# Patient Record
Sex: Male | Born: 1945
Health system: Southern US, Community
[De-identification: ages and names within clinical notes are randomized; demographics above are authoritative.]

## PROBLEM LIST (undated history)

## (undated) DIAGNOSIS — M2142 Flat foot [pes planus] (acquired), left foot: Secondary | ICD-10-CM

## (undated) DIAGNOSIS — M2141 Flat foot [pes planus] (acquired), right foot: Secondary | ICD-10-CM

## (undated) DIAGNOSIS — H269 Unspecified cataract: Secondary | ICD-10-CM

## (undated) DIAGNOSIS — E119 Type 2 diabetes mellitus without complications: Secondary | ICD-10-CM

## (undated) DIAGNOSIS — R7402 Elevation of levels of lactic acid dehydrogenase (LDH): Secondary | ICD-10-CM

## (undated) DIAGNOSIS — K219 Gastro-esophageal reflux disease without esophagitis: Secondary | ICD-10-CM

## (undated) DIAGNOSIS — M1990 Unspecified osteoarthritis, unspecified site: Secondary | ICD-10-CM

## (undated) DIAGNOSIS — D649 Anemia, unspecified: Secondary | ICD-10-CM

## (undated) DIAGNOSIS — Z973 Presence of spectacles and contact lenses: Secondary | ICD-10-CM

## (undated) DIAGNOSIS — C61 Malignant neoplasm of prostate: Secondary | ICD-10-CM

## (undated) DIAGNOSIS — G629 Polyneuropathy, unspecified: Secondary | ICD-10-CM

## (undated) DIAGNOSIS — R053 Chronic cough: Secondary | ICD-10-CM

## (undated) DIAGNOSIS — G4730 Sleep apnea, unspecified: Secondary | ICD-10-CM

## (undated) DIAGNOSIS — E519 Thiamine deficiency, unspecified: Secondary | ICD-10-CM

## (undated) DIAGNOSIS — F40241 Acrophobia: Secondary | ICD-10-CM

## (undated) DIAGNOSIS — M722 Plantar fascial fibromatosis: Secondary | ICD-10-CM

## (undated) DIAGNOSIS — E669 Obesity, unspecified: Secondary | ICD-10-CM

## (undated) DIAGNOSIS — N401 Enlarged prostate with lower urinary tract symptoms: Secondary | ICD-10-CM

## (undated) DIAGNOSIS — K7689 Other specified diseases of liver: Secondary | ICD-10-CM

## (undated) DIAGNOSIS — L920 Granuloma annulare: Secondary | ICD-10-CM

## (undated) DIAGNOSIS — K766 Portal hypertension: Secondary | ICD-10-CM

## (undated) DIAGNOSIS — K5790 Diverticulosis of intestine, part unspecified, without perforation or abscess without bleeding: Secondary | ICD-10-CM

## (undated) DIAGNOSIS — K269 Duodenal ulcer, unspecified as acute or chronic, without hemorrhage or perforation: Secondary | ICD-10-CM

## (undated) DIAGNOSIS — C9110 Chronic lymphocytic leukemia of B-cell type not having achieved remission: Secondary | ICD-10-CM

## (undated) DIAGNOSIS — Z86010 Personal history of colonic polyps: Secondary | ICD-10-CM

## (undated) DIAGNOSIS — K7030 Alcoholic cirrhosis of liver without ascites: Secondary | ICD-10-CM

## (undated) DIAGNOSIS — I10 Essential (primary) hypertension: Secondary | ICD-10-CM

## (undated) DIAGNOSIS — R05 Cough: Secondary | ICD-10-CM

## (undated) DIAGNOSIS — N433 Hydrocele, unspecified: Secondary | ICD-10-CM

## (undated) DIAGNOSIS — J189 Pneumonia, unspecified organism: Secondary | ICD-10-CM

## (undated) DIAGNOSIS — R740 Nonspecific elevation of levels of transaminase and lactic acid dehydrogenase [LDH]: Secondary | ICD-10-CM

## (undated) DIAGNOSIS — K769 Liver disease, unspecified: Secondary | ICD-10-CM

## (undated) DIAGNOSIS — M25511 Pain in right shoulder: Secondary | ICD-10-CM

## (undated) DIAGNOSIS — R351 Nocturia: Secondary | ICD-10-CM

## (undated) DIAGNOSIS — R7401 Elevation of levels of liver transaminase levels: Secondary | ICD-10-CM

## (undated) HISTORY — DX: Diverticulosis of intestine, part unspecified, without perforation or abscess without bleeding: K57.90

## (undated) HISTORY — DX: Granuloma annulare: L92.0

## (undated) HISTORY — DX: Nonspecific elevation of levels of transaminase and lactic acid dehydrogenase (ldh): R74.0

## (undated) HISTORY — DX: Polyneuropathy, unspecified: G62.9

## (undated) HISTORY — DX: Elevation of levels of lactic acid dehydrogenase (LDH): R74.02

## (undated) HISTORY — DX: Plantar fascial fibromatosis: M72.2

## (undated) HISTORY — DX: Gastro-esophageal reflux disease without esophagitis: K21.9

## (undated) HISTORY — PX: WISDOM TOOTH EXTRACTION: SHX21

## (undated) HISTORY — DX: Acrophobia: F40.241

## (undated) HISTORY — DX: Unspecified osteoarthritis, unspecified site: M19.90

## (undated) HISTORY — DX: Thiamine deficiency, unspecified: E51.9

## (undated) HISTORY — PX: SIGMOIDOSCOPY: SUR1295

## (undated) HISTORY — DX: Cough: R05

## (undated) HISTORY — DX: Type 2 diabetes mellitus without complications: E11.9

## (undated) HISTORY — DX: Chronic cough: R05.3

## (undated) HISTORY — DX: Chronic lymphocytic leukemia of B-cell type not having achieved remission: C91.10

## (undated) HISTORY — DX: Hydrocele, unspecified: N43.3

## (undated) HISTORY — DX: Elevation of levels of liver transaminase levels: R74.01

## (undated) HISTORY — DX: Alcoholic cirrhosis of liver without ascites: K70.30

## (undated) HISTORY — DX: Unspecified cataract: H26.9

## (undated) HISTORY — DX: Sleep apnea, unspecified: G47.30

## (undated) HISTORY — DX: Personal history of colonic polyps: Z86.010

---

## 1998-02-03 HISTORY — PX: FLEXIBLE SIGMOIDOSCOPY: SHX1649

## 1998-07-11 ENCOUNTER — Ambulatory Visit (HOSPITAL_COMMUNITY): Admission: RE | Admit: 1998-07-11 | Discharge: 1998-07-11 | Payer: Self-pay | Admitting: Internal Medicine

## 1999-02-04 DIAGNOSIS — M109 Gout, unspecified: Secondary | ICD-10-CM | POA: Insufficient documentation

## 1999-02-04 DIAGNOSIS — K766 Portal hypertension: Secondary | ICD-10-CM | POA: Insufficient documentation

## 1999-02-04 DIAGNOSIS — N400 Enlarged prostate without lower urinary tract symptoms: Secondary | ICD-10-CM | POA: Insufficient documentation

## 2004-01-31 ENCOUNTER — Ambulatory Visit: Payer: Self-pay | Admitting: Internal Medicine

## 2004-02-16 ENCOUNTER — Ambulatory Visit: Payer: Self-pay | Admitting: Internal Medicine

## 2005-05-05 ENCOUNTER — Ambulatory Visit: Payer: Self-pay | Admitting: Internal Medicine

## 2006-07-09 ENCOUNTER — Telehealth (INDEPENDENT_AMBULATORY_CARE_PROVIDER_SITE_OTHER): Payer: Self-pay | Admitting: *Deleted

## 2006-07-10 DIAGNOSIS — F40298 Other specified phobia: Secondary | ICD-10-CM | POA: Insufficient documentation

## 2006-07-10 DIAGNOSIS — M722 Plantar fascial fibromatosis: Secondary | ICD-10-CM | POA: Insufficient documentation

## 2006-08-03 ENCOUNTER — Ambulatory Visit: Payer: Self-pay | Admitting: Internal Medicine

## 2006-08-03 DIAGNOSIS — Z8739 Personal history of other diseases of the musculoskeletal system and connective tissue: Secondary | ICD-10-CM | POA: Insufficient documentation

## 2006-08-03 DIAGNOSIS — T887XXA Unspecified adverse effect of drug or medicament, initial encounter: Secondary | ICD-10-CM | POA: Insufficient documentation

## 2006-08-05 ENCOUNTER — Encounter (INDEPENDENT_AMBULATORY_CARE_PROVIDER_SITE_OTHER): Payer: Self-pay | Admitting: *Deleted

## 2006-08-07 LAB — CONVERTED CEMR LAB
AST: 22 units/L (ref 0–37)
CO2: 31 meq/L (ref 19–32)
Chloride: 105 meq/L (ref 96–112)
Creatinine, Ser: 1 mg/dL (ref 0.4–1.5)
Potassium: 4.2 meq/L (ref 3.5–5.1)
Sodium: 142 meq/L (ref 135–145)
Total Bilirubin: 1.4 mg/dL — ABNORMAL HIGH (ref 0.3–1.2)
Total Protein: 7.5 g/dL (ref 6.0–8.3)
Uric Acid, Serum: 8 mg/dL — ABNORMAL HIGH (ref 2.4–7.0)

## 2006-08-10 ENCOUNTER — Encounter (INDEPENDENT_AMBULATORY_CARE_PROVIDER_SITE_OTHER): Payer: Self-pay | Admitting: *Deleted

## 2007-09-27 ENCOUNTER — Encounter (INDEPENDENT_AMBULATORY_CARE_PROVIDER_SITE_OTHER): Payer: Self-pay | Admitting: *Deleted

## 2007-09-27 ENCOUNTER — Telehealth (INDEPENDENT_AMBULATORY_CARE_PROVIDER_SITE_OTHER): Payer: Self-pay | Admitting: *Deleted

## 2009-02-03 DIAGNOSIS — N433 Hydrocele, unspecified: Secondary | ICD-10-CM

## 2009-02-03 HISTORY — DX: Hydrocele, unspecified: N43.3

## 2009-10-04 ENCOUNTER — Ambulatory Visit: Payer: Self-pay | Admitting: Internal Medicine

## 2009-10-04 DIAGNOSIS — K219 Gastro-esophageal reflux disease without esophagitis: Secondary | ICD-10-CM | POA: Insufficient documentation

## 2009-10-04 DIAGNOSIS — S8410XA Injury of peroneal nerve at lower leg level, unspecified leg, initial encounter: Secondary | ICD-10-CM | POA: Insufficient documentation

## 2009-10-04 DIAGNOSIS — N433 Hydrocele, unspecified: Secondary | ICD-10-CM | POA: Insufficient documentation

## 2009-10-09 ENCOUNTER — Ambulatory Visit: Payer: Self-pay | Admitting: Internal Medicine

## 2009-10-11 ENCOUNTER — Telehealth (INDEPENDENT_AMBULATORY_CARE_PROVIDER_SITE_OTHER): Payer: Self-pay | Admitting: *Deleted

## 2009-10-12 ENCOUNTER — Encounter: Admission: RE | Admit: 2009-10-12 | Discharge: 2009-10-12 | Payer: Self-pay | Admitting: Internal Medicine

## 2009-10-15 ENCOUNTER — Telehealth (INDEPENDENT_AMBULATORY_CARE_PROVIDER_SITE_OTHER): Payer: Self-pay | Admitting: *Deleted

## 2009-11-02 ENCOUNTER — Telehealth (INDEPENDENT_AMBULATORY_CARE_PROVIDER_SITE_OTHER): Payer: Self-pay | Admitting: *Deleted

## 2010-03-05 NOTE — Progress Notes (Signed)
Summary: better explaination   Phone Note Call from Patient Call back at [phone]   Caller: Patient Summary of Call: patient would like better explaination of [place] of scrotum Initial call taken by: [name] Spring,  October 15, 2009 10:46 AM  Follow-up for Phone Call        spoke w/ patient ultrasound report explained patient says he is ok at this time and if he develops and discomfort or notices any further swelling he will proceed w/ urology referral........Marland KitchenDoristine Devoid CMA  October 15, 2009 4:03 PM

## 2010-03-05 NOTE — Assessment & Plan Note (Signed)
Summary: FOR BRONCHITIS//PH   Vital Signs:  Patient profile:   65 year old male Weight:      254.2 pounds Temp:     98.4 degrees F 98.4 Pulse rate:   88 / minute Resp:     15 per minute BP sitting:   130 / 78  (left arm) Cuff size:   large  Vitals Entered By: Shonna Chock CMA (October 04, 2009 4:03 PM) CC: 1.) Bronchitis symptoms x 3 months-worse x 3 days   2.) Refill Colchicine and New rx for allpurinolol, Cough   CC:  1.) Bronchitis symptoms x 3 months-worse x 3 days   2.) Refill Colchicine and New rx for allpurinolol and Cough.  History of Present Illness: Cough      This is a 66 year old man who presents with Cough X 3 months , but worse X 3 days.  The patient reports productive cough, shortness of breath especially after a big meal, wheezing, exertional dyspnea, and malaise, but denies pleuritic chest pain, fever, and hemoptysis.  Associated symtpoms include acid reflux symptoms of intermittent dyspepsia.  The patient denies the following symptoms: cold/URI symptoms, sore throat, and nasal congestion.  Risk factors include history of reflux.  No PMH of asthma.No Rx to date. He quit smoking 1972.  Current Medications (verified): 1)  Colchicine 0.6 Mg  Tabs (Colchicine)  Allergies (verified): No Known Drug Allergies  Review of Systems ENT:  Denies difficulty swallowing and hoarseness; No frontal headache , facial pain or purulence. GI:  Denies bloody stools and dark tarry stools. GU:  Testicular asymmetry for 6 months; R 3X > L. Neuro:  Complains of numbness and tingling; N&T in feet.  Physical Exam  General:  in no acute distress; alert,appropriate and cooperative throughout examination Ears:  External ear exam shows no significant lesions or deformities.  Otoscopic examination reveals wax bilaterally Nose:  External nasal examination shows no deformity or inflammation. Nasal mucosa are pink and moist without lesions or exudates. Mouth:  Oral mucosa and oropharynx  without lesions or exudates.  Teeth in good repair. Marked pharyngeal erythema.   Lungs:  Normal respiratory effort, chest expands symmetrically. Lungs are clear to auscultation, no crackles or wheezes. Green sputum produced  Heart:  Normal rate and regular rhythm. S1 and S2 normal without gallop, murmur, click, rub .S4 Genitalia:   ? VERY large R hydrocele.   Skin:  Intact without suspicious lesions or rashes Cervical Nodes:  No lymphadenopathy noted Axillary Nodes:  No palpable lymphadenopathy Inguinal Nodes:  No significant adenopathy Psych:  memory intact for recent and remote, normally interactive, and good eye contact.     Impression & Recommendations:  Problem # 1:  COUGH (ICD-786.2) probabbly from ERD Orders: T-2 View CXR (71020TC)  Problem # 2:  GERD (ICD-530.81)  dramatic oropharyngeal erythema in non smoker  His updated medication list for this problem includes:    Prilosec Otc 20 Mg Tbec (Omeprazole magnesium) .Marland Kitchen... 1 two times a day pre meals  Problem # 3:  PERONEAL NEUROPATHY (ICD-956.3)  Problem # 4:  BRONCHITIS-ACUTE (ICD-466.0)  His updated medication list for this problem includes:    Azithromycin 250 Mg Tabs (Azithromycin) .Marland Kitchen... As per pack  Problem # 5:  HYDROCELE (ICD-603.9)  Orders: Radiology Referral (Radiology)  Complete Medication List: 1)  Colchicine 0.6 Mg Tabs (Colchicine) 2)  Gabapentin 100 Mg Caps (Gabapentin) .Marland Kitchen.. 1 every 8 hrs as needed for neuropathy 3)  Azithromycin 250 Mg Tabs (Azithromycin) .... As per  pack 4)  Prilosec Otc 20 Mg Tbec (Omeprazole magnesium) .Marland Kitchen.. 1 two times a day pre meals  Patient Instructions: 1)  Avoid foods high in acid (tomatoes, citrus juices, spicy foods). Avoid eating within two hours of lying down or before exercising. Do not over eat; try smaller more frequent meals. Elevate head of bed twelve inches when sleeping. 2)  Drink as much fluid as you can tolerate for the next few days. Prescriptions: PRILOSEC OTC  20 MG TBEC (OMEPRAZOLE MAGNESIUM) 1 two times a day pre meals  #30 x 0   Entered and Authorized by:   Marga Melnick MD   Signed by:   Marga Melnick MD on 10/04/2009   Method used:   Samples Given   RxID:   [id number] AZITHROMYCIN 250 MG TABS (AZITHROMYCIN) as per pack  #1 x 0   Entered and Authorized by:   Marga Melnick MD   Signed by:   Marga Melnick MD on 10/04/2009   Method used:   Faxed to ...       [facility] (retail)       [address]       [place], [place]  [address]       Ph: [phone] or [phone]       Fax: [phone]   RxID:   [id number] GABAPENTIN 100 MG CAPS (GABAPENTIN) 1 every 8 hrs as needed for neuropathy  #30 x 2   Entered and Authorized by:   Marga Melnick MD   Signed by:   Marga Melnick MD on 10/04/2009   Method used:   Faxed to ...       [facility] (retail)       [address]       [place], [place]  [address]       Ph: [phone] or [phone]       Fax: [phone]   RxID:   [id number]

## 2010-03-05 NOTE — Progress Notes (Signed)
Summary: still no better  Phone Note Call from Patient Call back at Work Phone [phone]   Summary of Call: pt seen on 10-04-09 for bronchitis. pt states that he has finished med but is no better.Pt given AZITHROMYCIN 250 MG TABS   pls advise..............Marland KitchenFelecia Deloach CMA  October 11, 2009 1:33 PM   Follow-up for Phone Call        Left message on Voicemail informing patient rx for amoxicillin sent to pharmacy on file, if patient with question or concerns patient to call Follow-up by: Shonna Chock CMA,  October 11, 2009 2:54 PM    New/Updated Medications: AMOXICILLIN-POT CLAVULANATE 500-125 MG TABS (AMOXICILLIN-POT CLAVULANATE) 1 two times a day with food Prescriptions: AMOXICILLIN-POT CLAVULANATE 500-125 MG TABS (AMOXICILLIN-POT CLAVULANATE) 1 two times a day with food  #14 x 0   Entered and Authorized by:   Marga Melnick MD   Signed by:   Shonna Chock CMA on 10/11/2009   Method used:   Faxed to ...       [facility] (retail)       [address]       [place], [place]  [address]       Ph: [phone] or [phone]       Fax: [phone]   RxID:   [id number]

## 2010-03-05 NOTE — Progress Notes (Signed)
Summary: rx for allopurinol and ED  Phone Note Call from Patient Call back at Home Phone [phone]   Summary of Call: Patient called was seen on 10/04/2009 was supposed to get prescription for allopurinol but prescription wasn't done also would like to have something for ED called says this has become a recurrent issue.   Hop pls advise Initial call taken by: Doristine Devoid CMA,  November 02, 2009 4:31 PM  Follow-up for Phone Call        Viagra 100 mg 1/2 -1 once daily as needed #6.  Allopurinol 300 mg once daily #90.Check Uric acid in 6 weeks (274.9)Allopurinol can not be taken with acute gout(will  make it worse) Follow-up by: Marga Melnick MD,  November 02, 2009 4:50 PM  Additional Follow-up for Phone Call Additional follow up Details #1::        spoke w/ patient aware prescription called into pharmacy along w/ recommendations ....Marland KitchenMarland KitchenDoristine Devoid CMA  November 02, 2009 4:56 PM      New/Updated Medications: VIAGRA 100 MG TABS (SILDENAFIL CITRATE) take 1/2-1 tablet daily as needed ALLOPURINOL 300 MG TABS (ALLOPURINOL) take one tablet daily Prescriptions: ALLOPURINOL 300 MG TABS (ALLOPURINOL) take one tablet daily  #90 x 0   Entered by:   Doristine Devoid CMA   Authorized by:   Marga Melnick MD   Signed by:   Doristine Devoid CMA on 11/02/2009   Method used:   Electronically to        [id number] (retail)       [address]       [place], [place]  [address]       Ph: [phone]       Fax: [phone]   RxID:   [id number] VIAGRA 100 MG TABS (SILDENAFIL CITRATE) take 1/2-1 tablet daily as needed  #6 x 0   Entered by:   Doristine Devoid CMA   Authorized by:   Marga Melnick MD   Signed by:   Doristine Devoid CMA on 11/02/2009   Method used:   Electronically to        [id number]* (retail)       [address]       [place], [place]  [address]       Ph: [phone]       Fax: [phone]   RxID:   [id number]

## 2010-03-14 ENCOUNTER — Encounter: Payer: Self-pay | Admitting: Internal Medicine

## 2010-03-30 ENCOUNTER — Encounter: Payer: Self-pay | Admitting: Internal Medicine

## 2010-05-09 ENCOUNTER — Encounter: Payer: Self-pay | Admitting: Internal Medicine

## 2010-05-09 ENCOUNTER — Ambulatory Visit (INDEPENDENT_AMBULATORY_CARE_PROVIDER_SITE_OTHER): Payer: Medicare Other | Admitting: Internal Medicine

## 2010-05-09 VITALS — BP 132/70 | HR 120 | Temp 98.5°F | Resp 14 | Ht 73.75 in | Wt 253.8 lb

## 2010-05-09 DIAGNOSIS — Z136 Encounter for screening for cardiovascular disorders: Secondary | ICD-10-CM

## 2010-05-09 DIAGNOSIS — M109 Gout, unspecified: Secondary | ICD-10-CM

## 2010-05-09 DIAGNOSIS — E8881 Metabolic syndrome: Secondary | ICD-10-CM

## 2010-05-09 DIAGNOSIS — N429 Disorder of prostate, unspecified: Secondary | ICD-10-CM

## 2010-05-09 DIAGNOSIS — T887XXA Unspecified adverse effect of drug or medicament, initial encounter: Secondary | ICD-10-CM

## 2010-05-09 DIAGNOSIS — N400 Enlarged prostate without lower urinary tract symptoms: Secondary | ICD-10-CM

## 2010-05-09 DIAGNOSIS — R946 Abnormal results of thyroid function studies: Secondary | ICD-10-CM

## 2010-05-09 DIAGNOSIS — J42 Unspecified chronic bronchitis: Secondary | ICD-10-CM

## 2010-05-09 DIAGNOSIS — K219 Gastro-esophageal reflux disease without esophagitis: Secondary | ICD-10-CM

## 2010-05-09 DIAGNOSIS — E781 Pure hyperglyceridemia: Secondary | ICD-10-CM

## 2010-05-09 DIAGNOSIS — Z0000 Encounter for general adult medical examination without abnormal findings: Secondary | ICD-10-CM

## 2010-05-09 DIAGNOSIS — S8410XA Injury of peroneal nerve at lower leg level, unspecified leg, initial encounter: Secondary | ICD-10-CM

## 2010-05-09 DIAGNOSIS — R7301 Impaired fasting glucose: Secondary | ICD-10-CM

## 2010-05-09 LAB — TSH: TSH: 1.54 u[IU]/mL (ref 0.35–5.50)

## 2010-05-09 LAB — CBC WITH DIFFERENTIAL/PLATELET
Basophils Absolute: 0 10*3/uL (ref 0.0–0.1)
Eosinophils Absolute: 0.1 10*3/uL (ref 0.0–0.7)
Hemoglobin: 18.1 g/dL — ABNORMAL HIGH (ref 13.0–17.0)
Lymphocytes Relative: 24.8 % (ref 12.0–46.0)
MCHC: 34.7 g/dL (ref 30.0–36.0)
Monocytes Relative: 8.7 % (ref 3.0–12.0)
Neutro Abs: 5.2 10*3/uL (ref 1.4–7.7)
Neutrophils Relative %: 65.3 % (ref 43.0–77.0)
RDW: 14 % (ref 11.5–14.6)

## 2010-05-09 LAB — LIPID PANEL
Cholesterol: 198 mg/dL (ref 0–200)
HDL: 78.6 mg/dL (ref >39.00)
Triglycerides: 68 mg/dL (ref 0.0–149.0)
VLDL: 13.6 mg/dL (ref 0.0–40.0)

## 2010-05-09 LAB — HEPATIC FUNCTION PANEL
ALT: 84 U/L — ABNORMAL HIGH (ref 0–53)
AST: 132 U/L — ABNORMAL HIGH (ref 0–37)
Albumin: 4.1 g/dL (ref 3.5–5.2)
Alkaline Phosphatase: 76 U/L (ref 39–117)
Total Protein: 7.7 g/dL (ref 6.0–8.3)

## 2010-05-09 LAB — BASIC METABOLIC PANEL
Calcium: 9.4 mg/dL (ref 8.4–10.5)
Creatinine, Ser: 0.8 mg/dL (ref 0.4–1.5)
GFR: 98.66 mL/min (ref >60.00)
Glucose, Bld: 110 mg/dL — ABNORMAL HIGH (ref 70–99)
Sodium: 143 mEq/L (ref 135–145)

## 2010-05-09 LAB — PSA: PSA: 6.07 ng/mL — ABNORMAL HIGH (ref 0.10–4.00)

## 2010-05-09 NOTE — Progress Notes (Signed)
Subjective:    Patient ID: Patrick Kidd, male    DOB: Jun 09, 1945, 65 y.o.   MRN: [id number]  HPI  Medicare Wellness Visit:  The following psychosocial & medical history were reviewed as required by Medicare.    social history including caffeine, alcohol,  tobacco use & exercise: One half cup of coffee a day; 3-4 alcoholic beverages a day; past medical history of smoking up to 3 packs per day and the period 1961 to 1973 ; exercises walking 3 times a week between 30-60 minutes.   home safety, activities of daily living, seatbelt use , and smoke alarm employment: No active safety issues in the home. He does have neuropathy and is careful in ambulation. No limitations to activities of daily living. He employs a seatbelt and has smoke alarms in the home.    power of attorney/living will status : Living will and power of attorney need to be updated   hearing and vision evaluation : Whisper heard at 58; see vital signs   orientation, memory and mental health assessment : Orientation x3; memory and recall normal; "WORLD" spelled backwards correctly; mood and affect normal.   travel history, immunization status , transfusion history, and preventive health surveillance assessment : Last foreign travel to the [place] in 2001; immunizations up-to-date. No transfusions. Dentist seen regularly. Colonoscopy has not been done "I neglected it". Standard of care reviewed; referral ordered for screening colonoscopy.     pertinent positives and negative items include: As noted    Review of Systems Patient reports no  vision/ hearing changes,anorexia, weight change, fever ,adenopathy,  swallowing issues, chest pain,palpitations, edema, hemoptysis, dyspnea(rest,  paroxysmal nocturnal), gastrointestinal  bleeding (melena, rectal bleeding), abdominal pain, excessive heart burn, GU symptoms( dysuria, hematuria, pyuria, voiding/incontinence  issues) syncope, focal weakness, memory loss,numbness & tingling,  skin/hair/nail changes,depression, anxiety, abnormal bruising/bleeding.  He has been taking oral B12; has a past history of low-normal B12 levels. B12 levels were checked during evaluation of his neuropathy.  He had  a mild gout attack in his left great toe in January of this year. He took allopurinol 300 mg for this it;the attack lasted 1.5 days.  He describes chronic mucus production. He coughs up  less than a teaspoon of mucus 12-14 times per day.  He has recurrent hoarseness as well,but he denies any significant reflux symptoms.      Objective:   Physical Exam General appearance is one of good nourishment; weight excess suggested. Skull is normocephalic without lymphadenopathy about the head, neck, or axilla. See current vital signs Eye - Pupils Equal Round Reactive to light, Extraocular movements intact, Fundi without hemorrhage or visible lesions, Conjunctiva without redness or discharge Neck:  No deformities, thyromegaly, masses, or tenderness noted.   Supple with full range of motion without pain. Ears:  External ear exam shows no significant lesions or deformities.  Otoscopic examination reveals some cerumen bilaterally. Oral exam: Dental hygiene is good; lips and gums are healthy appearing.There is   oropharyngeal erythema ; no  exudate noted. Heart:  Normal rate and regular rhythm. S1 and S2 normal without gallop, murmur, click, rub . S4 present.  Lungs:Chest clear to auscultation; no wheezes, rhonchi,rales ,or rubs present despite symptoms.No increased work of breathing. Abdomen: bowel sounds normal, soft and non-tender without masses, organomegaly or hernias noted.  No guarding or rebound . Abdomen protuberant.Genitourinary: Hydrocele is present on the right. The penis & urethra are normal in  appearance without discharge or erythema. Rectal tone is normal. The prostate is upper limits of  normal to minimally enlarged in size; there is no induration or nodule present. Extremities:  No cyanosis, edema, or deformity noted with good range of motion of all major joints.   Neurologic exam :DTRs WNL.Balance normal  Romberg normal, finger to nose  Psych:  Cognition and judgment appear intact. Alert, communicative  and cooperative with normal attention span and concentration.  Skin:  Intact without suspicious lesions or rashes         Assessment & Plan:   #1 Medicare wellness exam; requirements met  #2 symptoms compatible with chronic bronchitis with daily sputum production; significant past medical history of smoking  #3 gout; he  has been using allopurinol for acute attacks which would be expected to exacerbate acute  gout  #4 surveillance for colonoscopy long overdue  #5 mild prostatic hypertrophy  Plan: Pulmonary function test will be scheduled; see ordered labs.

## 2010-05-09 NOTE — Patient Instructions (Addendum)
Preventive Health Care: Exercise at least 30-45 minutes a day,  3-4 days a week.  Eat a low-fat diet with lots of fruits and vegetables, up to 7-9 servings per day. Avoid obesity; your goal is waist measurement < 40 inches.Consume less than 40 grams of sugar per day from foods & drinks with High Fructose Corn Sugar as #2,3 or # 4 on label. Seatbelts can save your life. Wear them always. Smoke detectors on every level of your home, check batteries every year. Eye Doctor - have an eye exam @ least annually.                                                        Alcohol If you drink, do it moderately,less than 9 drinks per week, preferably less than 6 @ most. Health Care Power of Attorney & Living Will. Complete if not in place ; these place you in charge of your health care decisions. Depression is common in our stressful world.If you're feeling down or losing interest in things you normally enjoy, please call .   Referral will be made for pulmonology assessment with extensive pulmonary function test.

## 2010-05-13 ENCOUNTER — Telehealth: Payer: Self-pay

## 2010-05-13 MED ORDER — CIPROFLOXACIN HCL 500 MG PO TABS: 500.0000 mg | ORAL_TABLET | Freq: Two times a day (BID) | ORAL | Status: DC

## 2010-05-13 NOTE — Telephone Encounter (Signed)
Message copied by Stephan Minister on Mon May 13, 2010 11:40 AM ------      Message from: Marga Melnick      Created: Sat May 11, 2010  8:18 AM       Please call  Rx for Cipro 500 mg bid #30 to drugstore of choice. He needs fasting labs 15-20 days  after Cipro started : hepatic panel, IBC,PSA ; 790.4, polycythemia, elevated PSA). OV 2-3 days later

## 2010-05-13 NOTE — Telephone Encounter (Signed)
Spoke with patient, sent in ABX. Patient then spoke with Dr.Hopper in ref to what the ABX is for.

## 2010-05-31 ENCOUNTER — Ambulatory Visit (INDEPENDENT_AMBULATORY_CARE_PROVIDER_SITE_OTHER)
Admission: RE | Admit: 2010-05-31 | Discharge: 2010-05-31 | Disposition: A | Discharge status: Home / Self Care | Payer: Medicare Other | Source: Ambulatory Visit | Attending: Pulmonary Disease | Admitting: Pulmonary Disease

## 2010-05-31 ENCOUNTER — Encounter: Payer: Self-pay | Admitting: Pulmonary Disease

## 2010-05-31 ENCOUNTER — Ambulatory Visit (INDEPENDENT_AMBULATORY_CARE_PROVIDER_SITE_OTHER): Payer: Medicare Other | Admitting: Pulmonary Disease

## 2010-05-31 VITALS — BP 122/78 | HR 79 | Temp 98.7°F | Ht 74.0 in | Wt 254.6 lb

## 2010-05-31 DIAGNOSIS — S8410XA Injury of peroneal nerve at lower leg level, unspecified leg, initial encounter: Secondary | ICD-10-CM

## 2010-05-31 DIAGNOSIS — K219 Gastro-esophageal reflux disease without esophagitis: Secondary | ICD-10-CM

## 2010-05-31 DIAGNOSIS — R05 Cough: Secondary | ICD-10-CM | POA: Insufficient documentation

## 2010-05-31 DIAGNOSIS — R053 Chronic cough: Secondary | ICD-10-CM

## 2010-05-31 DIAGNOSIS — R059 Cough, unspecified: Secondary | ICD-10-CM

## 2010-05-31 MED ORDER — ALBUTEROL SULFATE HFA 108 (90 BASE) MCG/ACT IN AERS: INHALATION_SPRAY | RESPIRATORY_TRACT | Status: DC

## 2010-05-31 MED ORDER — FLUTICASONE PROPIONATE 50 MCG/ACT NA SUSP: 2.0000 | Freq: Every day | NASAL | Status: DC

## 2010-05-31 NOTE — Assessment & Plan Note (Signed)
He has a remote history of smoking.  He has cough for one year w/o specific inciting event.  He has partial improvement with anti-reflux therapy.  I will give him a trial of flonase for possible sinus disease and post-nasal drip.  Will give him a trial of albuterol inhaler, and schedule pulmonary function test to further assess for asthma.  Will repeat his chest xray to exclude lung parenchymal disorder.  Depending on the results of this, he may need augmentation of his anti-reflux medication and possible GI evaluation.  I have advised him to try salt water gargles and sugarless candy to maintain his salivary flow.

## 2010-05-31 NOTE — Progress Notes (Signed)
Subjective:    Patient ID: Patrick Kidd, male    DOB: 10-10-1945, 65 y.o.   MRN: [id number]  HPI 65 yo male with chronic cough.  He has noticed his cough for the past one year.  This is usually dry, but occasionally with clear sputum.  He denies hemoptysis.  He was diagnosed with GERD and has been using prevacid 15 mg bid for 6 months.  This has helped, but he still has a cough.  He does not recall anything specifically which triggered his cough to begin.  He denies dysphagia/odynophagia.  He is not aware of allergies or sinus congestion.  He is not aware of post-nasal drip.  He gets a wheeze in his throat.  He notices his sinuses get blocked when he lays down.  He gets winded when he walks up stairs.  He denies chest pain or palpitations.  He denies abdominal pain, nausea, or diarrhea.  He denies skin rashes, leg swelling, or gland swelling.  He has been told he has neuropathy in his legs, but he is not sure what this is from.  He was tried on neurontin, but this didn't help.  He denies asthma.  He had pneumonia years ago.  There is no history of TB.  He has never had PFT's.  He had a chest xray in Sept. 2011 which was unremarkable.  He does not have any animal exposure.  He 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 associate.  He is from [place], and denies travel.  He quit smoking in 1973.  He was consuming two alcohol drinks per day until about two weeks ago.  He was advised to reduce his drinking.  He has noticed his reflux improved after he stopped drinking alcohol, but this has not made any difference with his cough.  Past Medical History  Diagnosis Date  . Peripheral neuropathy   . Gout   . Chronic bronchitis      Family History  Problem Relation Age of Onset  . Lung cancer Mother   . Leukemia Father     Acute myelocytic     History   Social History  . Marital Status: Married    Spouse Name: N/A    Number of Children: 2  . Years of Education: N/A   Occupational History  . Commercial  real estate    Social History Main Topics  . Smoking status: Former Smoker -- 2.0 packs/day for 6 years    Types: Cigarettes    Quit date: 02/04/1971  . Smokeless tobacco: Not on file  . Alcohol Use: 10.5 oz/week    21 drink(s) per week     2-3 a day  . Drug Use: No  . Sexually Active: Not on file   Other Topics Concern  . Not on file   Social History Narrative  . No narrative on file     No Known Allergies   Outpatient Prescriptions Prior to Visit  Medication Sig Dispense Refill  . ciprofloxacin (CIPRO) 500 MG tablet Take 1 tablet (500 mg total) by mouth 2 (two) times daily.  30 tablet  0  . sildenafil (VIAGRA) 100 MG tablet Take 100 mg by mouth daily as needed. 1/2-1 by mouth as needed       . omeprazole (PRILOSEC) 20 MG capsule Take 20 mg by mouth 2 (two) times daily.        Marland Kitchen allopurinol (ZYLOPRIM) 300 MG tablet Take 300 mg by mouth daily.        Marland Kitchen  gabapentin (NEURONTIN) 100 MG capsule Take 100 mg by mouth 3 (three) times daily. 1 cap by mouth every 8 hours as needed for neuropathy           Review of Systems  Constitutional: Positive for appetite change.  Respiratory: Positive for cough and shortness of breath.        Objective:   Physical Exam Filed Vitals:   05/31/10 1033 05/31/10 1035  BP:  122/78  Pulse:  79  Temp: 98.7 F (37.1 C)   TempSrc: Oral   Height: 6\' 2"  (1.88 m)   Weight: 254 lb 9.6 oz (115.486 kg)   SpO2:  93%   General - Obese, healthy, no distress HEENT  - PERRLA, EOMI, no sinus tenderness, no nasal discharge, no oral exudate, mild erythema posterior pharynx, no LAN, no thyromegaly Cardiac - s1s2 regular, no murmur, pulses symmetric Chest - CTA Abd - obese, soft, nontender, no masses, normal bowel sounds Ext - no e/c/c Neuro - normal strength, CN intact, A&Ox3 Skin - no rashes Psych - normal behavior and mood       Assessment & Plan:   Chronic cough He has a remote history of smoking.  He has cough for one year w/o specific  inciting event.  He has partial improvement with anti-reflux therapy.  I will give him a trial of flonase for possible sinus disease and post-nasal drip.  Will give him a trial of albuterol inhaler, and schedule pulmonary function test to further assess for asthma.  Will repeat his chest xray to exclude lung parenchymal disorder.  Depending on the results of this, he may need augmentation of his anti-reflux medication and possible GI evaluation.  I have advised him to try salt water gargles and sugarless candy to maintain his salivary flow.  PERONEAL NEUROPATHY He has c/o lower extremity numbness not response to medical therapies.  I have advised him to discuss with primary care, and then determine if further neurology evaluation is needed.  I advised him that I don't think his numbness is related to his cough.  GERD He is to continue with prevacid BID for now.    Updated Medication List Outpatient Encounter Prescriptions as of 05/31/2010  Medication Sig Dispense Refill  . ciprofloxacin (CIPRO) 500 MG tablet Take 1 tablet (500 mg total) by mouth 2 (two) times daily.  30 tablet  0  . lansoprazole (PREVACID) 15 MG capsule 1 tablet twice a day       . sildenafil (VIAGRA) 100 MG tablet Take 100 mg by mouth daily as needed. 1/2-1 by mouth as needed       . DISCONTD: omeprazole (PRILOSEC) 20 MG capsule Take 20 mg by mouth 2 (two) times daily.        Marland Kitchen albuterol (PROVENTIL HFA;VENTOLIN HFA) 108 (90 BASE) MCG/ACT inhaler Two puffs up to four times per day as needed for cough, wheeze, chest congestion, or shortness of breath  1 Inhaler  6  . fluticasone (FLONASE) 50 MCG/ACT nasal spray 2 sprays by Nasal route daily.  16 g  2  . DISCONTD: allopurinol (ZYLOPRIM) 300 MG tablet Take 300 mg by mouth daily.        Marland Kitchen DISCONTD: gabapentin (NEURONTIN) 100 MG capsule Take 100 mg by mouth 3 (three) times daily. 1 cap by mouth every 8 hours as needed for neuropathy

## 2010-05-31 NOTE — Patient Instructions (Signed)
Flonase two sprays each nostril daily until next visit Albuterol inhaler two puffs up to four times per day as needed for cough, wheeze, chest congestion or shortness of breath Chest xray today Will schedule breathing test (PFT) Follow up in 2 weeks

## 2010-05-31 NOTE — Assessment & Plan Note (Signed)
He has c/o lower extremity numbness not response to medical therapies.  I have advised him to discuss with primary care, and then determine if further neurology evaluation is needed.  I advised him that I don't think his numbness is related to his cough.

## 2010-05-31 NOTE — Assessment & Plan Note (Signed)
He is to continue with prevacid BID for now.

## 2010-06-03 ENCOUNTER — Telehealth: Payer: Self-pay | Admitting: Pulmonary Disease

## 2010-06-03 NOTE — Telephone Encounter (Signed)
CXR reviewed.  Will have my nurse call to inform pt that CXR was clear.  No change to current treatment plan.

## 2010-06-04 ENCOUNTER — Other Ambulatory Visit: Payer: Self-pay | Admitting: Internal Medicine

## 2010-06-04 ENCOUNTER — Other Ambulatory Visit: Payer: Self-pay

## 2010-06-04 ENCOUNTER — Other Ambulatory Visit: Payer: Medicare Other

## 2010-06-04 DIAGNOSIS — D649 Anemia, unspecified: Secondary | ICD-10-CM

## 2010-06-04 DIAGNOSIS — D45 Polycythemia vera: Secondary | ICD-10-CM

## 2010-06-04 DIAGNOSIS — R7401 Elevation of levels of liver transaminase levels: Secondary | ICD-10-CM

## 2010-06-04 DIAGNOSIS — Z0000 Encounter for general adult medical examination without abnormal findings: Secondary | ICD-10-CM

## 2010-06-04 LAB — CBC WITH DIFFERENTIAL/PLATELET
Basophils Absolute: 0 10*3/uL (ref 0.0–0.1)
Eosinophils Relative: 2 % (ref 0–5)
HCT: 46.6 % (ref 39.0–52.0)
Hemoglobin: 16.7 g/dL (ref 13.0–17.0)
Lymphocytes Relative: 32 % (ref 12–46)
Lymphs Abs: 2.4 10*3/uL (ref 0.7–4.0)
MCV: 101.7 fL — ABNORMAL HIGH (ref 78.0–100.0)
Monocytes Absolute: 0.8 10*3/uL (ref 0.1–1.0)
Neutro Abs: 4.3 10*3/uL (ref 1.7–7.7)
RBC: 4.58 MIL/uL (ref 4.22–5.81)
RDW: 13.4 % (ref 11.5–15.5)
WBC: 7.6 10*3/uL (ref 4.0–10.5)

## 2010-06-04 LAB — BASIC METABOLIC PANEL
BUN: 12 mg/dL (ref 6–23)
CO2: 27 mEq/L (ref 19–32)
Chloride: 103 mEq/L (ref 96–112)
Potassium: 4.6 mEq/L (ref 3.5–5.3)

## 2010-06-04 LAB — LIPID PANEL
HDL: 50 mg/dL (ref >39)
LDL Cholesterol: 91 mg/dL (ref 0–99)
Triglycerides: 69 mg/dL (ref <150)
VLDL: 14 mg/dL (ref 0–40)

## 2010-06-04 LAB — IRON AND TIBC
%SAT: 35 % (ref 20–55)
Iron: 104 ug/dL (ref 42–165)

## 2010-06-04 MED ORDER — CIPROFLOXACIN HCL 500 MG PO TABS: 500.0000 mg | ORAL_TABLET | Freq: Two times a day (BID) | ORAL | Status: DC

## 2010-06-04 NOTE — Telephone Encounter (Signed)
Patient with pending appointment on 06/11/2010, per Dr.Hopper give #20 no refills

## 2010-06-04 NOTE — Telephone Encounter (Signed)
Patient requesting refill on Cipro, Dr.Hopper please advise [facility] [address]

## 2010-06-04 NOTE — Telephone Encounter (Signed)
lmomtcb x1 

## 2010-06-04 NOTE — Telephone Encounter (Signed)
OK X1 

## 2010-06-05 LAB — HEMOGLOBIN A1C: Hgb A1c MFr Bld: 5.8 % — ABNORMAL HIGH (ref <5.7)

## 2010-06-05 LAB — TSH: TSH: 2.136 u[IU]/mL (ref 0.350–4.500)

## 2010-06-05 NOTE — Telephone Encounter (Signed)
Spoke w/ pt and advised him of cxr results. Pt verbalized understanding and had no questions  Carver Fila, CMA

## 2010-06-11 ENCOUNTER — Ambulatory Visit (INDEPENDENT_AMBULATORY_CARE_PROVIDER_SITE_OTHER): Payer: Medicare Other | Admitting: Internal Medicine

## 2010-06-11 ENCOUNTER — Encounter: Payer: Self-pay | Admitting: Internal Medicine

## 2010-06-11 VITALS — BP 122/80 | HR 72 | Wt 256.0 lb

## 2010-06-11 DIAGNOSIS — R7989 Other specified abnormal findings of blood chemistry: Secondary | ICD-10-CM

## 2010-06-11 DIAGNOSIS — R972 Elevated prostate specific antigen [PSA]: Secondary | ICD-10-CM

## 2010-06-11 DIAGNOSIS — N400 Enlarged prostate without lower urinary tract symptoms: Secondary | ICD-10-CM

## 2010-06-11 NOTE — Patient Instructions (Signed)
Please stop the B12 supplements. Complete full course of Cipro. Recheck fasting liver function tests (hepatic panel), B12, uric acid  & PSA in 6 months . Exercise at least 30-45 minutes a day,  3-4 days a week.  Eat a low-fat diet with lots of fruits and vegetables, up to 7-9 servings per day. Avoid obesity; your goal is waist measurement < 40 inches.Consume less than 40 grams of sugar per day from foods & drinks with High Fructose Corn Sugar as #2,3 or # 4 on label. [employer] or [place]  low carb is a good choice Alcohol If you drink, do it moderately,less than 9 drinks per week, preferably less than 6 @ most.Avoid excess Tylenol, alcohol & vitamin A to protect the liver.

## 2010-06-11 NOTE — Progress Notes (Signed)
  Subjective:    Patient ID: Patrick Kidd, male    DOB: August 29, 1945, 65 y.o.   MRN: [id number]  HPI  He  returns for followup of lab abnormalities. Most importantly the PSA has dropped  from 6.07 to 4.15 on Cipro. This suggests that the elevation was due to the low-grade prostatitis. His glucose remains elevated but his A1c is verified to be in the nondiabetic range at 5.8.   Unfortunately liver function tests were not repeated as requested. His AST had been 132 (normal less than 37) and ALT 84 ( less than 53).  He has decreased alcohol intake; he now drinks 2 nights per week.  His iron level is normal; B12 levels are supranormal. He has a history of B12 deficiency in the past. He's been on 600 units of vitamin B12 twice a day.    Review of Systems he has no genitourinary symptoms of dysuria, hematuria, or pyuria. He has no hesitancy , voiding or incontinence issues.     Objective:   Physical Exam General appearance is one of good health and nourishment w/o distress.  Eyes: No conjunctival inflammation or scleral icterus is present.   Heart:  Normal rate and regular rhythm. S1 and S2 normal without gallop, murmur, click, rub or other extra sounds   Physiologic S4   Lungs:Chest clear to auscultation; no wheezes, rhonchi,rales ,or rubs present.No increased work of breathing.   Abdomen: bowel sounds normal, soft and non-tender without masses, NO HEPATOLOGY or splenomegaly noted.  No guarding or rebound   Skin:Warm & dry.  Intact without suspicious lesions or rashes ; no jaundice or tenting  Lymphatic: No lymphadenopathy is noted about the head, neck, axillary  areas.   Genitourinary: see DRE results from CPX ( no nodules or induration)             Assessment & Plan:  #1 mildly elevated PSA has decreased on Cipro; this suggests  prostatitis.   #2 elevated liver function tests, probably multifactorial he  #3 hyperglycemia without diabetes; this is most likely related to  alcohol intake as his shortness runs are normal.  #4 past medical history B12 deficiency; B12 now supranormal  Plan: #1 complete full course of Cipro  #2 repeat fasting hepatic panel, B12 level and PSA in 6 months.

## 2010-06-18 ENCOUNTER — Ambulatory Visit (INDEPENDENT_AMBULATORY_CARE_PROVIDER_SITE_OTHER): Payer: Medicare Other | Admitting: Internal Medicine

## 2010-06-18 ENCOUNTER — Encounter: Payer: Self-pay | Admitting: Internal Medicine

## 2010-06-18 ENCOUNTER — Encounter: Payer: Self-pay | Admitting: Pulmonary Disease

## 2010-06-18 VITALS — BP 132/70 | HR 120 | Ht 74.0 in | Wt 256.0 lb

## 2010-06-18 DIAGNOSIS — Z1211 Encounter for screening for malignant neoplasm of colon: Secondary | ICD-10-CM

## 2010-06-18 MED ORDER — PEG-KCL-NACL-NASULF-NA ASC-C 100 G PO SOLR: 1.0000 | Freq: Once | ORAL | Status: AC

## 2010-06-18 NOTE — Progress Notes (Signed)
65 year old white man with no active GI complaints. He was scheduled for office visit to arrange a screening colonoscopy. This will be done there will be no charge for this visit as this is usually a nurse visit only.

## 2010-06-18 NOTE — Patient Instructions (Signed)
You Have been scheduled for a Colonoscopy with separate instructions given. Please pick up your prep from your pharmacy.

## 2010-06-19 ENCOUNTER — Encounter: Payer: Self-pay | Admitting: Internal Medicine

## 2010-06-21 ENCOUNTER — Ambulatory Visit (INDEPENDENT_AMBULATORY_CARE_PROVIDER_SITE_OTHER): Payer: Medicare Other | Admitting: Pulmonary Disease

## 2010-06-21 ENCOUNTER — Encounter: Payer: Self-pay | Admitting: Pulmonary Disease

## 2010-06-21 VITALS — BP 134/68 | HR 104 | Temp 98.4°F | Ht 74.0 in | Wt 253.0 lb

## 2010-06-21 DIAGNOSIS — R059 Cough, unspecified: Secondary | ICD-10-CM

## 2010-06-21 DIAGNOSIS — R053 Chronic cough: Secondary | ICD-10-CM

## 2010-06-21 DIAGNOSIS — R05 Cough: Secondary | ICD-10-CM

## 2010-06-21 LAB — PULMONARY FUNCTION TEST

## 2010-06-21 NOTE — Progress Notes (Signed)
Subjective:    Patient ID: Patrick Kidd, male    DOB: November 19, 1945, 65 y.o.   MRN: [id number]  HPI 65 yo male with remote smoking history with chronic cough.  He returns to review PFT's.  This showed no obstruction, mild restriction, normal diffusion, and possible bronchodilator response from change in FEF 25-75%.  His cough is better with improved control of his reflux.  He still has occasional cough.  He is not wheezing or bringing up much sputum.  He did not notice any difference from inhaler therapy.  He has not had any problem with his sinuses.  Past Medical History  Diagnosis Date  . Peripheral neuropathy   . Gout   . Chronic cough   . GERD (gastroesophageal reflux disease)      Family History  Problem Relation Age of Onset  . Lung cancer Mother   . Breast cancer Mother   . Leukemia Father     Acute myelocytic     History   Social History  . Marital Status: Married    Spouse Name: N/A    Number of Children: 2  . Years of Education: N/A   Occupational History  . Commercial real estate    Social History Main Topics  . Smoking status: Former Smoker -- 2.0 packs/day for 6 years    Types: Cigarettes    Quit date: 02/04/1971  . Smokeless tobacco: Never Used  . Alcohol Use: 10.5 oz/week    21 drink(s) per week     2-3 a day  . Drug Use: No  . Sexually Active: Not on file   Other Topics Concern  . Not on file   Social History Narrative  . No narrative on file     No Known Allergies   Outpatient Prescriptions Prior to Visit  Medication Sig Dispense Refill  . ciprofloxacin (CIPRO) 500 MG tablet Take 1 tablet (500 mg total) by mouth 2 (two) times daily.  20 tablet  0  . lansoprazole (PREVACID) 15 MG capsule 1 tablet twice a day       . sildenafil (VIAGRA) 100 MG tablet Take 100 mg by mouth daily as needed. 1/2-1 by mouth as needed       . fluticasone (FLONASE) 50 MCG/ACT nasal spray 2 sprays by Nasal route daily.  16 g  2   Review of Systems      Objective:   Physical Exam Filed Vitals:   06/21/10 1628 06/21/10 1631  BP:  134/68  Pulse:  104  Temp: 98.4 F (36.9 C)   TempSrc: Oral   Height: 6\' 2"  (1.88 m)   Weight: 253 lb (114.76 kg)   SpO2:  94%   General - Obese, healthy, no distress  HEENT - PERRLA, EOMI, no sinus tenderness, no nasal discharge, no oral exudate, mild erythema posterior pharynx, no LAN, no thyromegaly  Cardiac - s1s2 regular, no murmur, pulses symmetric  Chest - CTA  Abd - obese, soft, nontender, no masses, normal bowel sounds  Ext - no e/c/c  Neuro - normal strength, CN intact, A&Ox3  Skin - no rashes  Psych - normal behavior and mood    CXR 05/31/10 normal PFT 06/21/10>>FEV1 3.30(95%), FEV1% 81, TLC 5.69(75%), DLCO 94%, weak BD response from FEF 25-75  Assessment & Plan:   Chronic cough His cough is improved with therapy for reflux therapy.  He did have mild bronchodilator response on PFT, but he did not notice difference with inhaler therapy.    He  does not think he needs specific therapy for asthma ie inhaled steroid, at this time.  He will call if his symptoms get worse.  He is to continue with anti-reflux therapy.  He is followed by Dr. Leone Payor, and advised him to speak with primary care or Dr. Leone Payor for further assessment of his reflux  Will forward note to Dr. Leone Payor for his review.    Updated Medication List Outpatient Encounter Prescriptions as of 06/21/2010  Medication Sig Dispense Refill  . ciprofloxacin (CIPRO) 500 MG tablet Take 1 tablet (500 mg total) by mouth 2 (two) times daily.  20 tablet  0  . lansoprazole (PREVACID) 15 MG capsule 1 tablet twice a day       . sildenafil (VIAGRA) 100 MG tablet Take 100 mg by mouth daily as needed. 1/2-1 by mouth as needed       . DISCONTD: fluticasone (FLONASE) 50 MCG/ACT nasal spray 2 sprays by Nasal route daily.  16 g  2

## 2010-06-21 NOTE — Patient Instructions (Signed)
Follow up with pulmonary as needed 

## 2010-06-21 NOTE — Progress Notes (Signed)
PFT done today. 

## 2010-06-21 NOTE — Assessment & Plan Note (Addendum)
His cough is improved with therapy for reflux therapy.  He did have mild bronchodilator response on PFT, but he did not notice difference with inhaler therapy.    He does not think he needs specific therapy for asthma ie inhaled steroid, at this time.  He will call if his symptoms get worse.  He is to continue with anti-reflux therapy.  He is followed by Dr. Leone Payor, and advised him to speak with primary care or Dr. Leone Payor for further assessment of his reflux  Will forward note to Dr. Leone Payor for his review.

## 2010-06-25 ENCOUNTER — Encounter: Payer: Self-pay | Admitting: Pulmonary Disease

## 2010-06-27 NOTE — Progress Notes (Signed)
I cannot see any notes in EMR re: visit with me for GERD though could have seen in pre-EMR days If ok on bid Prevacid at 15 mg (sounds like he is)then can continue that He can schedule an office visit with me to review GERD if needed but could follow with PCP on this as long as no dysphagia, bleeding, anemia.

## 2010-07-03 ENCOUNTER — Ambulatory Visit (AMBULATORY_SURGERY_CENTER): Payer: Medicare Other | Admitting: Internal Medicine

## 2010-07-03 ENCOUNTER — Encounter: Payer: Self-pay | Admitting: Internal Medicine

## 2010-07-03 VITALS — BP 152/106 | HR 87 | Temp 98.4°F | Resp 18 | Ht 74.0 in | Wt 256.0 lb

## 2010-07-03 DIAGNOSIS — K5730 Diverticulosis of large intestine without perforation or abscess without bleeding: Secondary | ICD-10-CM

## 2010-07-03 DIAGNOSIS — Z86010 Personal history of colonic polyps: Secondary | ICD-10-CM

## 2010-07-03 DIAGNOSIS — K5790 Diverticulosis of intestine, part unspecified, without perforation or abscess without bleeding: Secondary | ICD-10-CM

## 2010-07-03 DIAGNOSIS — Z1211 Encounter for screening for malignant neoplasm of colon: Secondary | ICD-10-CM

## 2010-07-03 DIAGNOSIS — D126 Benign neoplasm of colon, unspecified: Secondary | ICD-10-CM

## 2010-07-03 DIAGNOSIS — K219 Gastro-esophageal reflux disease without esophagitis: Secondary | ICD-10-CM

## 2010-07-03 DIAGNOSIS — Z860101 Personal history of adenomatous and serrated colon polyps: Secondary | ICD-10-CM

## 2010-07-03 HISTORY — DX: Personal history of colonic polyps: Z86.010

## 2010-07-03 HISTORY — PX: COLONOSCOPY W/ POLYPECTOMY: SHX1380

## 2010-07-03 HISTORY — DX: Personal history of adenomatous and serrated colon polyps: Z86.0101

## 2010-07-03 HISTORY — DX: Diverticulosis of intestine, part unspecified, without perforation or abscess without bleeding: K57.90

## 2010-07-03 MED ORDER — SODIUM CHLORIDE 0.9 % IV SOLN: 500.0000 mL | INTRAVENOUS | Status: DC

## 2010-07-03 MED ORDER — LANSOPRAZOLE 30 MG PO CPDR: 30.0000 mg | DELAYED_RELEASE_CAPSULE | Freq: Two times a day (BID) | ORAL | Status: DC

## 2010-07-03 NOTE — Patient Instructions (Addendum)
Prevacid 15 mg changed to lansoprazole 30mg  twice a day to treat GERD and cough. Call Dr. Marvell Fuller office to make an appointment - to be seen in August for reassessment. If cough due to reflux then should get better but may take 2-3 months on this treatment. Lose weight also, it will help.  Gastroesophageal Reflux Disease (GERD) Your caregiver has diagnosed your chest discomfort as caused by gastroesophageal reflux disease (GERD). GERD is caused by a reflux of acid from your stomach into the digestive tube between your mouth and stomach (esophagus). Acid in contact with the esophagus causes soreness (inflammation) resulting in heartburn or chest pain. It may cause small holes in the lining of the esophagus (ulcers). It can cause cough and hoarseness. CAUSES  Increased body weight puts pressure on the stomach, making acid rise.   Smoking increases acid production.   Alcohol decreases pressure on the valve between the stomach and esophagus (lower esophageal sphincter), allowing acid from the stomach into the esophagus.   Late evening meals and a full stomach increase pressure and acid production.   Lower esophageal sphincter is malformed.   Sometimes, no reason is found.  HOME CARE INSTRUCTIONS  Change the factors that you can control. Weight, smoking, or alcohol changes may be difficult to change on your own. Your caregiver can provide guidance and medical therapy.   Raising the head of your bed may help you to sleep.   Over-the-counter medicines will decrease acid production. Your caregiver can also prescribe medicines for this. Only take over-the-counter or prescription medicines for pain, discomfort, or fever as directed by your caregiver.      SEEK IMMEDIATE MEDICAL CARE IF:  The pain changes in location (radiates into arms, neck, jaw, teeth, or back), intensity, or duration.   You start feeling sick to your stomach (nauseous), start throwing up (vomiting), or sweating  (diaphoresis).   You develop left arm or jaw pain.   You develop pain going into your back, shortness of breath, or you pass out.   There is vomiting of fluid that is green, yellow, or looks like coffee grounds or blood.  These symptoms could signal other problems, such as heart disease. MAKE SURE YOU:  Understand these instructions.   Will watch your condition.   Will get help right away if you are not doing well or get worse.  Document Released: 10/30/2004 Document Re-Released: 04/16/2009 ExitCare Patient Information 2011 [place], [facility] Discharged instructions given with verbal understanding. Resume previous medications.

## 2010-07-03 NOTE — Assessment & Plan Note (Signed)
Increase PPi for cough thought de to GERD See me in august

## 2010-07-03 NOTE — Progress Notes (Signed)
Still having cough thought to be due to GERD. Will change PPI to high dose bid and he will see me in August to reassess

## 2010-07-04 ENCOUNTER — Telehealth: Payer: Self-pay | Admitting: *Deleted

## 2010-07-04 NOTE — Telephone Encounter (Signed)

## 2010-07-09 ENCOUNTER — Encounter: Payer: Self-pay | Admitting: Pulmonary Disease

## 2010-07-10 NOTE — Progress Notes (Signed)
Quick Note:  Two adenomas <1cm Diverticulosis Routine repeat colonoscopy 2017 Letter created ______

## 2010-09-26 ENCOUNTER — Encounter: Payer: Self-pay | Admitting: Internal Medicine

## 2010-09-26 ENCOUNTER — Ambulatory Visit (INDEPENDENT_AMBULATORY_CARE_PROVIDER_SITE_OTHER): Payer: Medicare Other | Admitting: Internal Medicine

## 2010-09-26 DIAGNOSIS — E669 Obesity, unspecified: Secondary | ICD-10-CM

## 2010-09-26 DIAGNOSIS — M109 Gout, unspecified: Secondary | ICD-10-CM

## 2010-09-26 DIAGNOSIS — R053 Chronic cough: Secondary | ICD-10-CM

## 2010-09-26 DIAGNOSIS — R059 Cough, unspecified: Secondary | ICD-10-CM

## 2010-09-26 DIAGNOSIS — R05 Cough: Secondary | ICD-10-CM

## 2010-09-26 DIAGNOSIS — K219 Gastro-esophageal reflux disease without esophagitis: Secondary | ICD-10-CM

## 2010-09-26 MED ORDER — PANTOPRAZOLE SODIUM 40 MG PO TBEC: DELAYED_RELEASE_TABLET | ORAL | Status: DC

## 2010-09-26 MED ORDER — COLCHICINE 0.6 MG PO TABS: 0.6000 mg | ORAL_TABLET | Freq: Every day | ORAL | Status: DC

## 2010-09-26 NOTE — Assessment & Plan Note (Signed)
Suspect GERD Needs better bid PPI complance See GERD assessment

## 2010-09-26 NOTE — Progress Notes (Signed)
  Subjective:    Patient ID: Patrick Kidd, male    DOB: 1945-07-23, 65 y.o.   MRN: [id number]  HPI Having post-prandial defecation since the colonoscopy 07/03/10. Gets gas pressure and has to defecate. Interfering with life some, has to get to bathroom quickly especially when getting up. Stool is loose and formed. No antibiotics. Lansoprazole was increased to 30 mg bid at the colonoscopy visit, he is missing evening dose every other night but is getting AM dose every other day. Somewhat better, about 20% re: throat clearing, cough (rare) and mucous production.  Denies allergies, sinus problems. No coffee or caffeine to speak of but is drinking a lot of grapefruit juice and orange juice. 3-4 alcoholic drinks a night   Review of Systems Gout flaring in right ankle and wants colchicine, also gangion cyst flaring right hand    Objective:   Physical Exam Obese, NAD Right ankle mildly swollen but not hot, other ankle, feet ok Tender bony nodule on the right hand, dorsum, in hypothenar area       Assessment & Plan:

## 2010-09-26 NOTE — Patient Instructions (Addendum)
Your prescription(s) has(have) been sent to your pharmacy for you to pick up. 1) lansoprazole was changed to pantoprazole. Be sure to take this twice a day 2) colchicine refilled 3) reduce alcohol to no more than 2 drinks a day 4) try to lose 10 pounds in 3-4 months by eating less, drinking less will help this also 5) reduce or eliminate the orange and grapefruit juice, the fruits themselves would be better but still need to watch with your GERD - Read GERD diet handout also 6) See Dr. Alwyn Ren in late October for recheck and he will arrange next follow-up with me or an ENT or both re: cough and voice issues 7) Make an appointment with an orthopedist about the nodule on your right hand.

## 2010-09-26 NOTE — Assessment & Plan Note (Signed)
Slight improvement in proximal sxs but not on bid regularly (PPI) Also increased stools with higher dose and more frequent lansoprazole Change to pantoprazole and see Dr. Alwyn Ren in late October for recheck - if ok then stay on bid PPI for 2 more months then try to go to qd. If still having problems would get ENT eval and/or return to me Reduce OJ and grapefruit juice

## 2010-09-26 NOTE — Assessment & Plan Note (Signed)
?   Mild flare Refill colchicine

## 2011-06-05 ENCOUNTER — Encounter: Payer: Self-pay | Admitting: Internal Medicine

## 2011-06-05 ENCOUNTER — Ambulatory Visit (INDEPENDENT_AMBULATORY_CARE_PROVIDER_SITE_OTHER)
Admission: RE | Admit: 2011-06-05 | Discharge: 2011-06-05 | Disposition: A | Discharge status: Home / Self Care | Payer: Medicare Other | Source: Ambulatory Visit | Attending: Internal Medicine | Admitting: Internal Medicine

## 2011-06-05 ENCOUNTER — Ambulatory Visit (INDEPENDENT_AMBULATORY_CARE_PROVIDER_SITE_OTHER): Payer: Medicare Other | Admitting: Internal Medicine

## 2011-06-05 VITALS — BP 124/78 | HR 95 | Temp 98.8°F | Resp 12 | Ht 73.75 in | Wt 260.2 lb

## 2011-06-05 DIAGNOSIS — R609 Edema, unspecified: Secondary | ICD-10-CM

## 2011-06-05 DIAGNOSIS — Z23 Encounter for immunization: Secondary | ICD-10-CM

## 2011-06-05 DIAGNOSIS — N400 Enlarged prostate without lower urinary tract symptoms: Secondary | ICD-10-CM | POA: Diagnosis not present

## 2011-06-05 DIAGNOSIS — M109 Gout, unspecified: Secondary | ICD-10-CM

## 2011-06-05 DIAGNOSIS — Z0000 Encounter for general adult medical examination without abnormal findings: Secondary | ICD-10-CM

## 2011-06-05 DIAGNOSIS — R05 Cough: Secondary | ICD-10-CM

## 2011-06-05 DIAGNOSIS — R053 Chronic cough: Secondary | ICD-10-CM

## 2011-06-05 DIAGNOSIS — R3915 Urgency of urination: Secondary | ICD-10-CM

## 2011-06-05 DIAGNOSIS — K219 Gastro-esophageal reflux disease without esophagitis: Secondary | ICD-10-CM

## 2011-06-05 DIAGNOSIS — R059 Cough, unspecified: Secondary | ICD-10-CM

## 2011-06-05 LAB — CBC WITH DIFFERENTIAL/PLATELET
Basophils Absolute: 0 10*3/uL (ref 0.0–0.1)
Basophils Relative: 0.4 % (ref 0.0–3.0)
Eosinophils Absolute: 0.1 10*3/uL (ref 0.0–0.7)
Lymphocytes Relative: 31.9 % (ref 12.0–46.0)
MCHC: 34.4 g/dL (ref 30.0–36.0)
Neutrophils Relative %: 57.2 % (ref 43.0–77.0)
RBC: 4.65 Mil/uL (ref 4.22–5.81)
RDW: 13.6 % (ref 11.5–14.6)

## 2011-06-05 LAB — BASIC METABOLIC PANEL
CO2: 26 mEq/L (ref 19–32)
Calcium: 8.9 mg/dL (ref 8.4–10.5)
Creatinine, Ser: 0.8 mg/dL (ref 0.4–1.5)

## 2011-06-05 LAB — HEPATIC FUNCTION PANEL
Alkaline Phosphatase: 71 U/L (ref 39–117)
Bilirubin, Direct: 0.4 mg/dL — ABNORMAL HIGH (ref 0.0–0.3)
Total Bilirubin: 1.6 mg/dL — ABNORMAL HIGH (ref 0.3–1.2)

## 2011-06-05 LAB — URIC ACID: Uric Acid, Serum: 6.9 mg/dL (ref 4.0–7.8)

## 2011-06-05 MED ORDER — SILDENAFIL CITRATE 100 MG PO TABS: 100.0000 mg | ORAL_TABLET | Freq: Every day | ORAL | Status: DC | PRN

## 2011-06-05 MED ORDER — RANITIDINE HCL 150 MG PO TABS: 150.0000 mg | ORAL_TABLET | Freq: Two times a day (BID) | ORAL | Status: DC

## 2011-06-05 NOTE — Assessment & Plan Note (Signed)
No gout attack since 2011. Colchicine used as needed only: Not on maintenance dose

## 2011-06-05 NOTE — Patient Instructions (Addendum)
Preventive Health Care: Exercise at least 30-45 minutes a day,  3-4 days a week.  Eat a low-fat diet with lots of fruits and vegetables, up to 7-9 servings per day. Consume less than 40 grams of sugar per day from foods & drinks with High Fructose Corn Sugar as # 1,2,3 or # 4 on label. Smoke detectors on every level of your home, check batteries every year. Alcohol If you drink, do it moderately,less than 9 drinks per week, preferably less than 6 @ most. Health Care Power of Attorney & Living Will. Complete if not in place ; these place you in charge of your health care decisions.  The triggers for reflux  include stress; the "aspirin family" ; alcohol; peppermint; and caffeine (coffee, tea, cola, and chocolate). The aspirin family would include aspirin and the nonsteroidal agents such as ibuprofen &  Naproxen. Tylenol would not cause reflux. If having symptoms ; food & drink should be avoided for @ least 2 hours before going to bed.  Order for x-rays entered into  the computer; these will be performed at [address] [facility]. across from [facility]. No appointment is necessary. Please try to go on My Chart within the next 24 hours to allow me to release the results directly to you.  Alternatively cheaper therapeutically equivalent options may be available from  [facility] at [phone] or  [facility] [place] [phone] (toll-free).

## 2011-06-05 NOTE — Assessment & Plan Note (Signed)
PPI was stopped; gastrologist this was causing frequent bowel movements. Ranitidine will be prescribed on a maintenance basis. Triggers were reviewed; only one present is  his increased alcohol intake

## 2011-06-05 NOTE — Progress Notes (Signed)
Subjective:    Patient ID: Patrick Kidd, male    DOB: Jan 14, 1946, 66 y.o.   MRN: [id number]  HPI Medicare Wellness Visit:  The following psychosocial & medical history were reviewed as required by Medicare.   Social history: caffeine: no , alcohol:  21/ week ,  tobacco use : quit 1973  & exercise : yard work   Home & personal  safety / fall risk: no issues, activities of daily living:no limitations , seatbelt use : yes , and smoke alarm employment :no .  Power of Attorney/Living Will status : needed  Vision ( as recorded per Nurse) & Hearing  evaluation : see exam; Ophth exam neg 2012. Orientation :oriented X3 , memory & recall :good,  math testing: good,and mood & affect :normal . Depression / anxiety: denied Travel history : [place] , immunization status :Shingles needed , transfusion history:  no, and preventive health surveillance ( colonoscopies, BMD , etc as per protocol/ SOC): 2012, Dental care: every 6 mos . Chart reviewed &  Updated. Active issues reviewed & addressed.       Review of Systems He continues to cough several times an hour; it is intermittently productive of white sputum. He denies fever, chills, sweats, or unexplained weight loss. He has not had pleuritic pain or chest pain. There is no history of hemoptysis. He has no associated frontal headache, facial pain or nasal purulence. He relates this to reflux based on discussions with his gastroenterologist. The symptoms of cough were helped by a PPI, but was stopped as he thought it might be increasing frequency of stool. He does not have significant dyspepsia but does have intermittent dysphasia. He is not having melena or rectal bleeding. He has multiple bowel movements a day in the context of excess gas. He has some urinary issues manifested as profound urgency at times. He denies dysuria, pyuria, or hematuria.    Objective:   Physical Exam Gen.: Healthy and well-nourished in appearance. Alert, appropriate and  cooperative throughout exam. Head: Normocephalic without obvious abnormalities  Eyes: No corneal or conjunctival inflammation noted. Pupils equal round reactive to light and accommodation. Fundal exam is benign without hemorrhages, exudate, papilledema. Extraocular motion intact. Vision grossly normal. Ears: External  ear exam reveals no significant lesions or deformities. Canals : increased wax bilaterally. Hearing is grossly normal bilaterally. Nose: External nasal exam reveals no deformity or inflammation. Nasal mucosa are pink and moist. No lesions or exudates noted.   Mouth: Oral mucosa and oropharynx reveal no lesions or exudates; marked erythema of oropharynx. Teeth in good repair. Neck: No deformities, masses, or tenderness noted. Range of motion & Thyroid normal Lungs: Normal respiratory effort; chest expands symmetrically. Lungs are clear to auscultation without rales, wheezes, or increased work of breathing. Heart: Normal rate and rhythm. Normal S1 and S2. No gallop, click, or rub. S 4 w/o murmur. Abdomen: Bowel sounds normal; abdomen soft and nontender. No masses, organomegaly or hernias noted. Genitalia/ DRE:  Hydrocele fills the right scrotum. Prostatic asymmetry with the left lobe upper limits of normal to minimally  increased in size. Right normal. No induration or nodularity  Musculoskeletal/extremities: No deformity or scoliosis noted of  the thoracic or lumbar spine. No clubbing, cyanosis,  or deformity noted. Range of motion  normal .Tone & strength  normal.Joints normal. Nail health  good. Trace edema right foot; 1/2+ edema left ankle. Vascular: Carotid, radial artery, dorsalis pedis and  posterior tibial pulses are full and equal. No bruits present. Neurologic: Alert and oriented x3. Deep tendon reflexes symmetrical but decreased (0+) @ knees.          Skin: Intact without suspicious lesions or rashes. Lymph:  No cervical, axillary, or inguinal lymphadenopathy present. Psych: Mood and affect are normal. Normally interactive                                                                                         Assessment & Plan:  #1 Medicare Wellness Exam; criteria met ; data entered #2 Problem List reviewed ; Assessment/ Recommendations made #3 mild edema; see orders Plan: see Orders

## 2011-10-30 DIAGNOSIS — H2510 Age-related nuclear cataract, unspecified eye: Secondary | ICD-10-CM | POA: Diagnosis not present

## 2011-10-30 DIAGNOSIS — IMO0002 Reserved for concepts with insufficient information to code with codable children: Secondary | ICD-10-CM | POA: Diagnosis not present

## 2011-11-26 DIAGNOSIS — H2510 Age-related nuclear cataract, unspecified eye: Secondary | ICD-10-CM | POA: Diagnosis not present

## 2011-12-05 HISTORY — PX: CATARACT EXTRACTION, BILATERAL: SHX1313

## 2011-12-10 DIAGNOSIS — H2510 Age-related nuclear cataract, unspecified eye: Secondary | ICD-10-CM | POA: Diagnosis not present

## 2011-12-17 DIAGNOSIS — H2510 Age-related nuclear cataract, unspecified eye: Secondary | ICD-10-CM | POA: Diagnosis not present

## 2012-01-07 DIAGNOSIS — M722 Plantar fascial fibromatosis: Secondary | ICD-10-CM | POA: Diagnosis not present

## 2012-01-07 DIAGNOSIS — G589 Mononeuropathy, unspecified: Secondary | ICD-10-CM | POA: Diagnosis not present

## 2012-02-17 ENCOUNTER — Ambulatory Visit (INDEPENDENT_AMBULATORY_CARE_PROVIDER_SITE_OTHER): Payer: Medicare Other | Admitting: Internal Medicine

## 2012-02-17 ENCOUNTER — Encounter: Payer: Self-pay | Admitting: Internal Medicine

## 2012-02-17 VITALS — BP 134/80 | HR 113 | Temp 100.2°F | Wt 261.0 lb

## 2012-02-17 DIAGNOSIS — R000 Tachycardia, unspecified: Secondary | ICD-10-CM

## 2012-02-17 DIAGNOSIS — R059 Cough, unspecified: Secondary | ICD-10-CM

## 2012-02-17 DIAGNOSIS — R0989 Other specified symptoms and signs involving the circulatory and respiratory systems: Secondary | ICD-10-CM

## 2012-02-17 DIAGNOSIS — R509 Fever, unspecified: Secondary | ICD-10-CM | POA: Diagnosis not present

## 2012-02-17 DIAGNOSIS — R0600 Dyspnea, unspecified: Secondary | ICD-10-CM

## 2012-02-17 DIAGNOSIS — R0609 Other forms of dyspnea: Secondary | ICD-10-CM | POA: Diagnosis not present

## 2012-02-17 DIAGNOSIS — R05 Cough: Secondary | ICD-10-CM | POA: Diagnosis not present

## 2012-02-17 LAB — POCT INFLUENZA A/B: Influenza A, POC: NEGATIVE

## 2012-02-17 MED ORDER — HYDROCODONE-HOMATROPINE 5-1.5 MG/5ML PO SYRP: 5.0000 mL | ORAL_SOLUTION | Freq: Four times a day (QID) | ORAL | Status: DC | PRN

## 2012-02-17 MED ORDER — FLUTICASONE-SALMETEROL 250-50 MCG/DOSE IN AEPB: 1.0000 | INHALATION_SPRAY | Freq: Two times a day (BID) | RESPIRATORY_TRACT | Status: DC

## 2012-02-17 MED ORDER — OSELTAMIVIR PHOSPHATE 75 MG PO CAPS: 75.0000 mg | ORAL_CAPSULE | Freq: Two times a day (BID) | ORAL | Status: DC

## 2012-02-17 NOTE — Progress Notes (Signed)
  Subjective:    Patient ID: Patrick Kidd, male    DOB: Nov 08, 1945, 67 y.o.   MRN: [id number]  HPI The respiratory tract symptoms began  02/15/12 as dry cough without sputum.   Fever present w/o chills, sweats ,arthralgias or myalgias.  Cough  associated with  shortness of breath and wheezing . Sleeping sitting up     Treatment with  ColdEase was not effective  There is no history of asthma. The patient had  quit smoking in 1973                Review of Systems Symptoms NOTpresent   include frontal headache, facial pain, dental pain, sore throat, nasal purulence, earache, & otic discharge Itchy , watery eyes & sneezing were not noted.     Objective:   Physical Exam General appearance:appears mildly ill but well nourished; no acute distress or increased work of breathing is present.  No  lymphadenopathy about the head, neck, or axilla noted.  Eyes: No conjunctival inflammation or lid edema is present. There is no scleral icterus. Ears:  External ear exam shows no significant lesions or deformities.  Otoscopic examination reveals wax bilaterally Nose:  External nasal examination shows no deformity or inflammation. Nasal mucosa are erythematous without lesions or exudates. No septal dislocation or deviation.No obstruction to airflow.  Oral exam: Dental hygiene is good; lips and gums are healthy appearing.There is moderate  oropharyngeal erythema ; no exudate noted.  Neck:  No deformities,  masses, or tenderness noted.   Supple with full range of motion without pain.  Heart:  Mild tachycardia and regular rhythm. S1 and S2 normal without gallop, murmur, click, rub or other extra sounds.  Lungs:Chest clear to auscultation; no significant wheezes, rhonchi,rales ,or rubs present.No increased work of breathing.  Brassy cough Extremities:  No cyanosis, edema, or clubbing  noted  Skin: Warm & dry w/o jaundice or tenting.          Assessment & Plan:  #1Flu like syndrome  with fever, nonproductive cough, tachycardia, dyspnea and wheezing. Despite negative nasal swab this clinical picture has been that we have seen repeatedly in the recent past with influenza. He did not take the flu shot Plan: See orders recommendations

## 2012-02-17 NOTE — Patient Instructions (Addendum)
Stay well hydrated. Drink to thirst up to 40 ounces of fluids daily. NSAIDS ( Aleve, Advil, Naproxen) or Tylenol every 4 hrs as needed for fever as discussed based on label recommendations   If you activate My Chart; the results can be released to you as soon as they populate from the lab. If you choose not to use this program; the labs have to be reviewed, copied & mailed   causing a delay in getting the results to you.

## 2012-02-18 LAB — CBC WITH DIFFERENTIAL/PLATELET
Basophils Absolute: 0 10*3/uL (ref 0.0–0.1)
Eosinophils Absolute: 0.1 10*3/uL (ref 0.0–0.7)
Lymphocytes Relative: 21 % (ref 12.0–46.0)
MCHC: 34.6 g/dL (ref 30.0–36.0)
Neutrophils Relative %: 70 % (ref 43.0–77.0)
RBC: 4.35 Mil/uL (ref 4.22–5.81)
RDW: 13.9 % (ref 11.5–14.6)

## 2012-03-20 ENCOUNTER — Other Ambulatory Visit: Payer: Self-pay

## 2012-04-22 DIAGNOSIS — Z961 Presence of intraocular lens: Secondary | ICD-10-CM | POA: Diagnosis not present

## 2012-07-16 ENCOUNTER — Encounter: Payer: Self-pay | Admitting: Internal Medicine

## 2012-07-16 ENCOUNTER — Ambulatory Visit (INDEPENDENT_AMBULATORY_CARE_PROVIDER_SITE_OTHER): Payer: Medicare Other | Admitting: Internal Medicine

## 2012-07-16 VITALS — BP 130/80 | HR 98 | Temp 98.7°F | Resp 12 | Ht 73.08 in | Wt 257.0 lb

## 2012-07-16 DIAGNOSIS — M109 Gout, unspecified: Secondary | ICD-10-CM | POA: Diagnosis not present

## 2012-07-16 DIAGNOSIS — Z86010 Personal history of colonic polyps: Secondary | ICD-10-CM | POA: Diagnosis not present

## 2012-07-16 DIAGNOSIS — N429 Disorder of prostate, unspecified: Secondary | ICD-10-CM

## 2012-07-16 DIAGNOSIS — G609 Hereditary and idiopathic neuropathy, unspecified: Secondary | ICD-10-CM | POA: Diagnosis not present

## 2012-07-16 DIAGNOSIS — R7401 Elevation of levels of liver transaminase levels: Secondary | ICD-10-CM

## 2012-07-16 DIAGNOSIS — Z0000 Encounter for general adult medical examination without abnormal findings: Secondary | ICD-10-CM | POA: Diagnosis not present

## 2012-07-16 DIAGNOSIS — Z87898 Personal history of other specified conditions: Secondary | ICD-10-CM

## 2012-07-16 DIAGNOSIS — E785 Hyperlipidemia, unspecified: Secondary | ICD-10-CM

## 2012-07-16 DIAGNOSIS — G629 Polyneuropathy, unspecified: Secondary | ICD-10-CM

## 2012-07-16 DIAGNOSIS — Z1211 Encounter for screening for malignant neoplasm of colon: Secondary | ICD-10-CM | POA: Insufficient documentation

## 2012-07-16 LAB — CBC WITH DIFFERENTIAL/PLATELET
Basophils Absolute: 0.1 10*3/uL (ref 0.0–0.1)
Eosinophils Absolute: 0.1 10*3/uL (ref 0.0–0.7)
MCHC: 34.4 g/dL (ref 30.0–36.0)
MCV: 107.1 fl — ABNORMAL HIGH (ref 78.0–100.0)
Monocytes Absolute: 0.7 10*3/uL (ref 0.1–1.0)
Neutrophils Relative %: 65.1 % (ref 43.0–77.0)
Platelets: 120 10*3/uL — ABNORMAL LOW (ref 150.0–400.0)

## 2012-07-16 LAB — URIC ACID: Uric Acid, Serum: 6.2 mg/dL (ref 4.0–7.8)

## 2012-07-16 LAB — BASIC METABOLIC PANEL
BUN: 9 mg/dL (ref 6–23)
CO2: 25 mEq/L (ref 19–32)
Calcium: 9.5 mg/dL (ref 8.4–10.5)
Chloride: 102 mEq/L (ref 96–112)
Creatinine, Ser: 0.7 mg/dL (ref 0.4–1.5)

## 2012-07-16 LAB — HEPATIC FUNCTION PANEL
Bilirubin, Direct: 0.5 mg/dL — ABNORMAL HIGH (ref 0.0–0.3)
Total Bilirubin: 1.9 mg/dL — ABNORMAL HIGH (ref 0.3–1.2)

## 2012-07-16 LAB — PSA: PSA: 5.03 ng/mL — ABNORMAL HIGH (ref 0.10–4.00)

## 2012-07-16 NOTE — Patient Instructions (Addendum)
Preventive Health Care: Exercise at least 30-45 minutes a day,  3-4 days a week.  Eat a low-fat diet with lots of fruits and vegetables, up to 7-9 servings per day. This would eliminate the need for vitamin supplements. Consume less than 40 grams of sugar (preferably ZERO) per day from foods & drinks with High Fructose Corn Sugar as #1,2,3 or # 4 on label. Alcohol If you drink, do it moderately,less than 9 drinks per week, preferably less than 6 @ most. Health Care Power of Attorney & Living Will. Complete these if not in place ; these place you in charge of your health care decisions.

## 2012-07-16 NOTE — Progress Notes (Signed)
Subjective:    Patient ID: Patrick Kidd, male    DOB: 06-26-1945, 67 y.o.   MRN: [id number]  HPI Medicare Wellness Visit:  Psychosocial & medical history were reviewed as required by Medicare (abuse,antisocial behavioral risks,firearm risk).  Social history: caffeine: none , alcohol: 3-4 drinks / day  ,  tobacco use:  Quit 1973   Exercise :  Not for 1 month Home & personal  safety / fall risk:due to neuropathy (see below) Limitations of activities of daily living:no Seatbelt  and smoke alarm use:yes Power of Attorney/Living Will status : needed Ophthalmology exam status :current Hearing evaluation status:not current Orientation :oriented X 3 Memory & recall :good Math testing:good Active depression / anxiety:no Foreign travel history : 2004 [place] Immunization status for Shingles /Flu/ PNA/ tetanus :Shingles needed Transfusion history:  no Preventive health surveillance status of colonoscopy as per protocol/ NWG:NFAOZHY Dental care:  Every 6 mos Chart reviewed &  Updated. Active issues reviewed & addressed.      Review of Systems  His peripheral neuropathy began approximately in 2010. Evaluation to date has included normal B12; TSH; and A1c.  Recently he sustained a fall with musculoskeletal injury because of weakness in the right lower extremity.  He's also had some numbness in his fingertips but the major issues are in both feet.  He makes a statement that he is not had any gout flare; but he probably couldn't feel it because of the numbness.    Objective:   Physical Exam Gen.:  well-nourished in appearance. Alert, appropriate and cooperative throughout exam. Head: Normocephalic without obvious abnormalities; pattern alopecia  Eyes: No corneal or conjunctival inflammation noted. Pupils equal round reactive to light and accommodation.  Extraocular motion intact. Vision grossly normal without lenses Ears: External  ear exam reveals no significant lesions or  deformities. Wax on R ;L TM normal. Hearing is grossly normal bilaterally. Nose: External nasal exam reveals no deformity or inflammation. Nasal mucosa are pink and moist. No lesions or exudates noted. Mouth: Oral mucosa and oropharynx reveal no lesions or exudates. Teeth in good repair. Neck: No deformities, masses, or tenderness noted. Range of motion & Thyroid normal. Lungs: Normal respiratory effort; chest expands symmetrically. Lungs are clear to auscultation without rales, wheezes, or increased work of breathing. Heart: Normal rate and rhythm. Normal S1 and S2. No gallop, click, or rub. S4 w/o murmur. Abdomen: Bowel sounds normal; abdomen soft and nontender. No masses or organomegaly .Large ventral hernia noted. Genitalia: Genitalia normal except for large R hydrocele. Prostate is upper limits of normal without  asymmetry, nodularity, or induration.                                Musculoskeletal/extremities: No deformity or scoliosis noted of  the thoracic or lumbar spine.  No clubbing, cyanosis,  or significant extremity  deformity noted. Trace - 1/2+ edema.Range of motion normal .Tone & strength . Normal. Crepitus knees. Joints normal . Nail health good. Able to lie down & sit up w/o help. Negative SLR bilaterally Vascular: Carotid, radial artery, dorsalis pedis and  posterior tibial pulses are full and equal. No bruits present. Neurologic: Alert and oriented x3. Deep tendon reflexes symmetrical and 1/2+ @ knees.  Gait :Slightly broad;normal   heel walking; tip toe walking slightly "slapping" .        Skin: Intact without suspicious lesions or rashes. Lymph: No cervical, axillary, or inguinal lymphadenopathy present. Psych: Mood  and affect are normal. Normally interactive                                                                                        Assessment & Plan:  #1 Medicare Wellness Exam; criteria met ; data entered #2 Problem List/Diagnoses reviewed Plan:   Assessments made/ Orders entered

## 2012-07-17 LAB — RPR

## 2012-07-19 ENCOUNTER — Ambulatory Visit: Payer: Medicare Other

## 2012-07-19 DIAGNOSIS — R7309 Other abnormal glucose: Secondary | ICD-10-CM

## 2012-07-19 LAB — HEMOGLOBIN A1C: Hgb A1c MFr Bld: 5.7 % (ref 4.6–6.5)

## 2012-08-04 ENCOUNTER — Ambulatory Visit (INDEPENDENT_AMBULATORY_CARE_PROVIDER_SITE_OTHER): Payer: Medicare Other | Admitting: Neurology

## 2012-08-04 ENCOUNTER — Encounter: Payer: Self-pay | Admitting: Neurology

## 2012-08-04 VITALS — BP 128/60 | HR 98 | Temp 98.5°F | Ht 74.0 in | Wt 254.0 lb

## 2012-08-04 DIAGNOSIS — Z79899 Other long term (current) drug therapy: Secondary | ICD-10-CM

## 2012-08-04 DIAGNOSIS — G609 Hereditary and idiopathic neuropathy, unspecified: Secondary | ICD-10-CM

## 2012-08-04 DIAGNOSIS — G629 Polyneuropathy, unspecified: Secondary | ICD-10-CM

## 2012-08-04 MED ORDER — GABAPENTIN 300 MG PO CAPS: 300.0000 mg | ORAL_CAPSULE | Freq: Three times a day (TID) | ORAL | Status: DC

## 2012-08-04 NOTE — Patient Instructions (Addendum)
We will test for polyneuropathy as well as looking for other common causes. 1.  Nerve conduction study/EMG 2.  Test blood for 2 hour glucose tolerance test, ANA, sed rate, serum electrophoresis, immunofixation, Lyme 3.  For foot discomfort, we will start gabapentin 300mg  three times daily.  Side effects include sleepiness and dizziness.  Call with any problems. 4.  Follow up in 2 months.  We will call you regarding your NCV/EMG appointment.    The glucose tolerance test must be done at the lab at [facility] across the street from [facility]. Those are done on Tuesday through Thursdays only. You must be fasting and plan to spend a few hours there.   [phone]

## 2012-08-04 NOTE — Progress Notes (Signed)
NEUROLOGY CONSULTATION NOTE  Patrick Kidd [id number] DOB: 26-Sep-1945  Referring physician: Dr. Alwyn Ren Primary care physician: Dr. Alwyn Ren  Reason for consult:  Polyneuropathy.  HISTORY OF PRESENT ILLNESS: Patrick Kidd is a 66 y.o. right-handed male who presents for evaluation of peripheral neuropathy.  Onset of symptoms began in 2010.  Patrick Kidd describes initially experiencing numbness on the bottom of both feet, in which recently it has spread to the ankles. It is also associated with an uncomfortable tingling sensation. It is not painful and doesn't really bother him too much but Patrick Kidd is concerned because Patrick Kidd had a couple of falls the last few months. The first time, Patrick Kidd was getting out of a boat and felt that his right leg was not strong enough to hold his weight and Patrick Kidd fell to his knees. The second time, Patrick Kidd tripped over a chair and fell down to his knees.Patrick Kidd felt that his leg just gave out when it paired his weight. Patrick Kidd is particularly concerned, because Patrick Kidd does enjoy riding on a boat and dancing.  Patrick Kidd denies any back pain or neck pain. Patrick Kidd denies any pain radiating down the legs. About 18 months ago, an 8 pound frozen tuck breast fell on his right big toe. Since that time, Patrick Kidd has experienced continued pain. If Patrick Kidd stands for greater than 30 minutes, Patrick Kidd will experience discomfort in his lower extremities. It doesn't really bother him when Patrick Kidd is laying down supine.  Patrick Kidd is no known family history of nerve problems. 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 Patrick Kidd has no history of exposure to chemicals or metals.  Prior labs last month revealed Hgb A1c 5.7, TSH 2.42, RPR non-reactive, serum glucose 131.  CBC and CMP otherwise unremarkable.  B12 from 5/12 was 1293.  Over past couple of years, Hgb A1c was been stable and serum glucose ranged from 110 to 131.  PAST MEDICAL HISTORY: Past Medical History  Diagnosis Date  . Gout   . Chronic cough     PMH of  . GERD (gastroesophageal reflux disease)   .  Peripheral neuropathy   . Diverticulosis 07-03-2010    Colonoscopy.   Marland Kitchen Hx of adenomatous colonic polyps 07-03-2010    Colonoscopy. F/U due 2017  . Plantar fasciitis     PMH of  . Hydrocele   . Acrophobia   . Nonspecific elevation of levels of transaminase or lactic acid dehydrogenase (LDH)     PAST SURGICAL HISTORY: Past Surgical History  Procedure Laterality Date  . Wisdom tooth extraction    . Flexible sigmoidoscopy  2000  . Colonoscopy w/ polypectomy  07/03/10    2 adenomas, diverticulosis on right. Dr Leone Payor  . Cataract extraction, bilateral  12/2011     Dr Nile Riggs    MEDICATIONS: Current Outpatient Prescriptions on File Prior to Visit  Medication Sig Dispense Refill  . colchicine 0.6 MG tablet Take 1 tablet (0.6 mg total) by mouth daily. as needed for gout flares  30 tablet  0  . sildenafil (VIAGRA) 100 MG tablet Take 1 tablet (100 mg total) by mouth daily as needed. 1/2-1 by mouth as needed  6 tablet  6   Current Facility-Administered Medications on File Prior to Visit  Medication Dose Route Frequency Provider Last Rate Last Dose  . 0.9 %  sodium chloride infusion  500 mL Intravenous Continuous Iva Boop, MD        ALLERGIES: No Known Allergies  FAMILY HISTORY: Family History  Problem Relation Age  of Onset  . Lung cancer Mother     smoker  . Leukemia Father     Acute myelocytic  . Diabetes Neg Hx   . Stroke Neg Hx   . Heart disease Neg Hx     SOCIAL HISTORY: History   Social History  . Marital Status: Married    Spouse Name: N/A    Number of Children: 2  . Years of Education: N/A   Occupational History  . Commercial real estate    Social History Main Topics  . Smoking status: Former Smoker -- 2.00 packs/day for 6 years    Types: Cigarettes    Quit date: 02/04/1971  . Smokeless tobacco: Never Used     Comment: smoked age [age], up to 2 ppd  . Alcohol Use: 12.6 oz/week    21 Shots of liquor per week     Comment: 2-3 a day  . Drug Use: No   . Sexually Active: Not on file   Other Topics Concern  . Not on file   Social History Narrative  . No narrative on file    PHYSICAL EXAM: Filed Vitals:   08/04/12 0807  BP: 128/60  Pulse: 98  Temp: 98.5 F (36.9 C)   General: No acute distress Head:  Normocephalic/atraumatic Neck: supple, no paraspinal tenderness, full range of motion Back: No paraspinal tenderness Heart: regular rate and rhythm Lungs: Clear to auscultation bilaterally. Neurological Exam: Mental status: alert and oriented to person, place, time and self, speech fluent and not dysarthric, language intact. Cranial nerves: CN I: not tested CN II: visual fields intact CN III, IV, VI: pupils equal, round and reactive to light, full range of motion, no nystagmus, optic discs normal. CN V: facial sensation intact CN VII: upper and lower face symmetric CN VIII: hearing intact CN IX, X: gag intact, uvula midline CN XI: sternocleidomastoid and trapezius muscles intact CN XII: tongue midline Bulk & Tone: normal, no fasciculations. Muscle strength: 5/5 throughout Sensation: reduced pinprick sensation up the the mid-foot, reduced vibration sensation up to below the knees, reduced proprioception in the toes. Deep Tendon Reflexes: 2+ and symmetric in upper extremities, 1+ in patellars, absent in ankles. Finger to nose testing: normal Heel to shin: normal Gait: mildly wide-based, mild difficulty with tandem. Romberg with mild sway.  IMPRESSION & PLAN: Patrick Kidd is a 67 y.o. male with symptoms consistent with peripheral neuropathy.  Etiology may be glucose intolerance, as pre-diabetes may still be a cause of polyneuropathy. 1.  We will perform a NCS/EMG to assess the neuropathy. 2.  We will check labs for other common causes of neuropathy, including SPEP/IFE, ANA, ESR, folate and Lyme.  We will also check a 2-hr GTT. 3.  For feet discomfort, we will start gabapentin 300mg  TID.  Side effects discussed. 4.   Follow up in 2 months.  60 minutes spent with patient, over 50% spent counseling and coordinating care.  Shon Millet, DO  CC: Marga Melnick, MD

## 2012-08-09 LAB — PROTEIN ELECTROPHORESIS, SERUM
Alpha-1-Globulin: 4.6 % (ref 2.9–4.9)
Alpha-2-Globulin: 10.1 % (ref 7.1–11.8)
Beta 2: 5.9 % (ref 3.2–6.5)
Gamma Globulin: 19 % — ABNORMAL HIGH (ref 11.1–18.8)

## 2012-08-19 DIAGNOSIS — Q825 Congenital non-neoplastic nevus: Secondary | ICD-10-CM | POA: Diagnosis not present

## 2012-08-19 DIAGNOSIS — L821 Other seborrheic keratosis: Secondary | ICD-10-CM | POA: Diagnosis not present

## 2012-08-19 DIAGNOSIS — L819 Disorder of pigmentation, unspecified: Secondary | ICD-10-CM | POA: Diagnosis not present

## 2012-08-19 DIAGNOSIS — D1801 Hemangioma of skin and subcutaneous tissue: Secondary | ICD-10-CM | POA: Diagnosis not present

## 2012-09-08 ENCOUNTER — Other Ambulatory Visit: Payer: Medicare Other

## 2012-09-08 DIAGNOSIS — Z79899 Other long term (current) drug therapy: Secondary | ICD-10-CM

## 2012-09-08 DIAGNOSIS — G629 Polyneuropathy, unspecified: Secondary | ICD-10-CM

## 2012-09-09 ENCOUNTER — Telehealth: Payer: Self-pay | Admitting: Neurology

## 2012-09-09 LAB — GLUCOSE TOLERANCE, 2 HOURS
Glucose, 2 hour: 341 mg/dL
Glucose, Fasting: 143 mg/dL — ABNORMAL HIGH (ref 70–99)

## 2012-09-14 ENCOUNTER — Encounter: Payer: Self-pay | Admitting: Neurology

## 2012-10-05 DIAGNOSIS — H43819 Vitreous degeneration, unspecified eye: Secondary | ICD-10-CM | POA: Diagnosis not present

## 2012-10-07 ENCOUNTER — Encounter: Payer: Self-pay | Admitting: Neurology

## 2012-10-07 ENCOUNTER — Ambulatory Visit (INDEPENDENT_AMBULATORY_CARE_PROVIDER_SITE_OTHER): Payer: Medicare Other | Admitting: Neurology

## 2012-10-07 DIAGNOSIS — G609 Hereditary and idiopathic neuropathy, unspecified: Secondary | ICD-10-CM | POA: Diagnosis not present

## 2012-10-07 DIAGNOSIS — G629 Polyneuropathy, unspecified: Secondary | ICD-10-CM

## 2012-10-07 NOTE — Procedures (Signed)
[facility]  [address] [place], [address]  [place], [place] [address] Tel: [phone] Fax:  [phone]  Test Date:  10/07/2012  Patient: Patrick Kidd DOB: 07/14/1945 Physician: Shon Millet, DO  Sex: Male Height: ' " Ref Phys: Marga Melnick, MD  ID#: [id number]   Technician:    Patient Complaints: Numbness and discomfort in both feet.  Has elevated fasting glucose.  NCV & EMG Findings: The motor conduction of the right peroneal nerve was unobtainable when recorded from the extensor digitorum brevis muscle (mildly reduced amplitude when recorded from the tibialis anterior muscle);  The motor conduction of the right tibial nerve shows normal distal latency and mildly reduced amplitude with normal velocity of the evoked response; that of the right median nerve shows borderline prolonged distal latency and normal velocity with normal amplitude of the evoked response.  The sensory conduction of the right sural nerve is unobtainable, that of the right radial nerve shows normal latency with reduced amplitude of the evoked response.  There were no abnormalities on the needle electrode examination when testing the right tibialis anterior, gastrocnemius, vastus medialis, iliopsoas and gluteus medius muscles.  As the limb muscle was normal, the paraspinal muscle was not tested.   All examined muscles (as indicated in the following table) showed no evidence of electrical instability.    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   ___________________________ Shon Millet, DO    Nerve Conduction Studies Anti Sensory Summary Table   Site NR Peak (ms) Norm Peak (ms) P-T Amp (V) Norm P-T Amp  Right Radial Anti Sensory (Base 1st Digit)  Wrist    2.5 <2.7 12.4 >20  Right Sural Anti Sensory (Lat Mall)  Calf NR  <4.4  >6   Motor Summary Table   Site NR Onset (ms) Norm Onset (ms) O-P Amp (mV) Norm O-P Amp Site1 Site2  Delta-0 (ms) Dist (cm) Vel (m/s) Norm Vel (m/s)  Right Median Motor (Abd Poll Brev)  Wrist    4.5 <4.4 8.9 >4 Elbow Wrist 6.8 33.0 49 >49  Elbow    11.3  7.0         Right Peroneal Motor (Ext Dig Brev)  Ankle NR  <6.1  >2        Right Peroneal TA Motor (Tib Ant)  Fib Head    4.3 <4.2 4.2  Poplit Fib Head 2.9 10.0 34 >40.5  Poplit    7.2 <5.7 3.6         Right Tibial Motor (Abd Hall Brev)  Ankle    4.9 <6.1 2.4 >3.0 Knee Ankle 10.7 44.0 41 >41  Knee    15.6  1.4          EMG   Side Muscle Nerve Root Ins Act Fibs Psw Amp Dur Poly Recrt Int Dennie Bible Comment  Right AntTibialis Dp Br Fibular L4-5 Nml Nml Nml Nml Nml 0 Nml Nml   Right Gastroc Tibial S1-2 Nml Nml Nml Nml Nml 0 Nml Nml   Right VastusMed Femoral L2-4 Nml Nml Nml Nml Nml 0 Nml Nml   Right Iliopsoas Femoral L2-3 Nml Nml Nml Nml Nml 0 Nml Nml   Right GluteusMed SupGluteal L5-S1 Nml Nml Nml Nml Nml 0 Nml Nml       Waveforms:

## 2012-10-07 NOTE — Progress Notes (Signed)
See EMG report.

## 2012-10-12 ENCOUNTER — Ambulatory Visit: Payer: Medicare Other | Admitting: Neurology

## 2012-12-09 ENCOUNTER — Other Ambulatory Visit: Payer: Self-pay

## 2013-01-06 ENCOUNTER — Ambulatory Visit (INDEPENDENT_AMBULATORY_CARE_PROVIDER_SITE_OTHER): Payer: Medicare Other | Admitting: Neurology

## 2013-01-06 ENCOUNTER — Encounter: Payer: Self-pay | Admitting: Neurology

## 2013-01-06 VITALS — BP 130/72 | HR 84 | Temp 98.0°F | Ht 74.0 in | Wt 271.0 lb

## 2013-01-06 DIAGNOSIS — R7302 Impaired glucose tolerance (oral): Secondary | ICD-10-CM

## 2013-01-06 DIAGNOSIS — R29818 Other symptoms and signs involving the nervous system: Secondary | ICD-10-CM | POA: Diagnosis not present

## 2013-01-06 DIAGNOSIS — R2689 Other abnormalities of gait and mobility: Secondary | ICD-10-CM

## 2013-01-06 DIAGNOSIS — G629 Polyneuropathy, unspecified: Secondary | ICD-10-CM

## 2013-01-06 DIAGNOSIS — G609 Hereditary and idiopathic neuropathy, unspecified: Secondary | ICD-10-CM | POA: Diagnosis not present

## 2013-01-06 DIAGNOSIS — R7309 Other abnormal glucose: Secondary | ICD-10-CM | POA: Diagnosis not present

## 2013-01-06 MED ORDER — GABAPENTIN 300 MG PO CAPS: 300.0000 mg | ORAL_CAPSULE | Freq: Two times a day (BID) | ORAL | Status: DC

## 2013-01-06 MED ORDER — CYCLOBENZAPRINE HCL 5 MG PO TABS: ORAL_TABLET | ORAL | Status: DC

## 2013-01-06 NOTE — Progress Notes (Signed)
NEUROLOGY FOLLOW UP OFFICE NOTE  Patrick Kidd [id number]  HISTORY OF PRESENT ILLNESS: Patrick Kidd is a 67 y.o. right-handed male who follows up for peripheral neuropathy.  He is accompanied by his wife.  Records and images were personally reviewed where available.    Onset of symptoms began in 2010.  He describes initially experiencing numbness on the bottom of both feet, in which recently it has spread to the ankles. It is also associated with an uncomfortable tingling sensation. It is not painful but it is uncomfortable and usually bothers him at night.  He doesn't really notice it if he is active.  He is concerned about his balance.  The first time, he was getting out of a boat and felt that his right leg was not strong enough to hold his weight and he fell to his knees. The second time, he tripped over a chair and fell down to his knees.  He felt that his leg just gave out when it paired his weight. He is particularly concerned, because he does enjoy riding on a boat and dancing.  More recently, he stood up from a chair at a game and felt shaky and unsteady.  He has not been exercising as much due to fear of imbalance.  He denies any pain radiating down the legs. About 18 months ago, an 8 pound frozen tuck breast fell on his right big toe and has continued discomfort and numbness in the toe.  He also notes tingling in his fingertips as well.  Labs from 08/04/12 revealed ESR 29, ANA negative, Lyme negative.  SPEP revealed possible faint restricted band in the gamma region, IFE revealed polyclonal gammopathy involving IgA and IgM.  Prior labs last month revealed Hgb A1c 5.7, TSH 2.42, RPR non-reactive, serum glucose 131.  CBC and CMP otherwise unremarkable.  B12 from 5/12 was 1293.  Fasting glucose was 143 and 2 hour glucose tolerance test was 341.  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  We started gabapentin 300mg  TID.   It has made him tired, so he now takes in twice a day.  It is helping with the discomfort.  For the past month, he has had neck pain.  It radiates down the right side of his neck and into the shoulder.  It is an aching and shooting type pain.  It is worse when laying in bed and looking up.  No shooting pain down the arm.  He denies any strain or trauma to the neck.  PAST MEDICAL HISTORY: Past Medical History  Diagnosis Date  . Gout   . Chronic cough     PMH of  . GERD (gastroesophageal reflux disease)   . Peripheral neuropathy   . Diverticulosis 07-03-2010    Colonoscopy.   Marland Kitchen Hx of adenomatous colonic polyps 07-03-2010    Colonoscopy. F/U due 2017  . Plantar fasciitis     PMH of  . Hydrocele   . Acrophobia   . Nonspecific elevation of levels of transaminase or lactic acid dehydrogenase (LDH)   . Neck pain     MEDICATIONS: Current Outpatient Prescriptions on File Prior to Visit  Medication Sig Dispense Refill  . colchicine 0.6 MG tablet Take 1 tablet (0.6 mg total) by mouth daily. as needed for gout flares  30 tablet  0  . Multiple Vitamin (MULTIVITAMIN) tablet Take 1 tablet by mouth daily.      . sildenafil (VIAGRA) 100 MG  tablet Take 1 tablet (100 mg total) by mouth daily as needed. 1/2-1 by mouth as needed  6 tablet  6   Current Facility-Administered Medications on File Prior to Visit  Medication Dose Route Frequency Provider Last Rate Last Dose  . 0.9 %  sodium chloride infusion  500 mL Intravenous Continuous Iva Boop, MD        ALLERGIES: No Known Allergies  FAMILY HISTORY: Family History  Problem Relation Age of Onset  . Lung cancer Mother     smoker  . Leukemia Father     Acute myelocytic  . Diabetes Neg Hx   . Stroke Neg Hx   . Heart disease Neg Hx     SOCIAL HISTORY: History   Social History  . Marital Status: Married    Spouse Name: N/A    Number of Children: 2  . Years of Education: N/A   Occupational History  . Commercial real estate     Social History Main Topics  . Smoking status: Former Smoker -- 2.00 packs/day for 6 years    Types: Cigarettes    Quit date: 02/04/1971  . Smokeless tobacco: Never Used     Comment: smoked age [age], up to 2 ppd  . Alcohol Use: 12.6 oz/week    21 Shots of liquor per week     Comment: 2-3 a day  . Drug Use: No  . Sexual Activity: Not on file   Other Topics Concern  . Not on file   Social History Narrative  . No narrative on file    REVIEW OF SYSTEMS: Constitutional: No fevers, chills, or sweats, no generalized fatigue, change in appetite Eyes: No visual changes, double vision, eye pain Ear, nose and throat: No hearing loss, ear pain, nasal congestion, sore throat Cardiovascular: No chest pain, palpitations Respiratory:  No shortness of breath at rest or with exertion, wheezes GastrointestinaI: No nausea, vomiting, diarrhea, abdominal pain, fecal incontinence Genitourinary:  No dysuria, urinary retention or frequency Musculoskeletal:  No neck pain, back pain Integumentary: No rash, pruritus, skin lesions Neurological: as above Psychiatric: No depression, insomnia, anxiety Endocrine: No palpitations, fatigue, diaphoresis, mood swings, change in appetite, change in weight, increased thirst Hematologic/Lymphatic:  No anemia, purpura, petechiae. Allergic/Immunologic: no itchy/runny eyes, nasal congestion, recent allergic reactions, rashes  PHYSICAL EXAM: Filed Vitals:   01/06/13 1040  BP: 130/72  Pulse: 84  Temp: 98 F (36.7 C)   General: No acute distress Head:  Normocephalic/atraumatic Neck: supple, no paraspinal tenderness, full range of motion Heart:  Regular rate and rhythm Lungs:  Clear to auscultation bilaterally Back: No paraspinal tenderness Neurological Exam: alert and oriented to person, place, and time. Speech fluent and not dysarthric, language intact.  CN II-XII intact. Fundoscopic exam unremarkable, no papilledema.  Bulk and tone normal, muscle strength  5/5 throughout.  Pinprick sensation reduced bilaterally up to the ankles.  Reduced vibration sensation in first digit of right foot.  Deep tendon reflexes 2+ throughout except absent at ankles, toes downgoing.  Finger to nose and heel to shin testing intact.  Gait mildly wide-based and cautious, but able to tandem walk, Romberg negative.  IMPRESSION: Peripheral neuropathy, likely related to impaired glucose intolerance.  PLAN: 1.  Continue gabapentin 300mg  since it is working.  Due to fatigue, take the bedtime dose daily and reserve a pill as needed during the day. 2.  Refer to PT for gait and balance. 3.  Cyclobenzaprine 5-10mg  at bedtime for neck pain. 4.  Must discuss with  Dr. Alwyn Ren regarding control of glucose intolerance. 5.  IFE did reveal polyclonal gammopathy (IgA and IgM).  This is nonspecific finding, but would repeat test next visit to make sure it does not turn to a monoclonal gammopathy. 6.  Follow up in 3 months.  Shon Millet, DO  CC:  Marga Melnick, MD

## 2013-01-06 NOTE — Patient Instructions (Signed)
1.  Continue gabapentin.  Take 1 tablet every night.  Take 1 tablet during the day if needed. 2.  I will refer you to physical therapy to help with balance. 3.  Likely, the nerve problem is due to diabetes or pre-diabetes.  Work with Dr. Alwyn Ren to get this under control. 4.  I will prescribe you cyclobenzaprine for the neck pain.  Take 1 or 2 pills at bedtime if needed.

## 2013-01-06 NOTE — Addendum Note (Signed)
Addended by: Benay Spice on: 01/06/2013 12:29 PM   Modules accepted: Orders

## 2013-01-11 ENCOUNTER — Encounter: Payer: Self-pay | Admitting: Internal Medicine

## 2013-01-11 ENCOUNTER — Ambulatory Visit (INDEPENDENT_AMBULATORY_CARE_PROVIDER_SITE_OTHER): Payer: Medicare Other | Admitting: Internal Medicine

## 2013-01-11 ENCOUNTER — Ambulatory Visit: Payer: Medicare Other | Attending: Neurology | Admitting: Physical Therapy

## 2013-01-11 VITALS — BP 134/73 | HR 93 | Temp 98.0°F | Resp 14 | Wt 266.8 lb

## 2013-01-11 DIAGNOSIS — R609 Edema, unspecified: Secondary | ICD-10-CM

## 2013-01-11 DIAGNOSIS — R269 Unspecified abnormalities of gait and mobility: Secondary | ICD-10-CM | POA: Diagnosis not present

## 2013-01-11 DIAGNOSIS — D890 Polyclonal hypergammaglobulinemia: Secondary | ICD-10-CM

## 2013-01-11 DIAGNOSIS — G629 Polyneuropathy, unspecified: Secondary | ICD-10-CM

## 2013-01-11 DIAGNOSIS — G609 Hereditary and idiopathic neuropathy, unspecified: Secondary | ICD-10-CM | POA: Diagnosis not present

## 2013-01-11 DIAGNOSIS — IMO0001 Reserved for inherently not codable concepts without codable children: Secondary | ICD-10-CM | POA: Insufficient documentation

## 2013-01-11 DIAGNOSIS — R7302 Impaired glucose tolerance (oral): Secondary | ICD-10-CM

## 2013-01-11 DIAGNOSIS — R7309 Other abnormal glucose: Secondary | ICD-10-CM

## 2013-01-11 LAB — BASIC METABOLIC PANEL
CO2: 24 mEq/L (ref 19–32)
Chloride: 99 mEq/L (ref 96–112)
Creatinine, Ser: 0.9 mg/dL (ref 0.4–1.5)
GFR: 88 mL/min (ref >60.00)
Potassium: 4.1 mEq/L (ref 3.5–5.1)
Sodium: 135 mEq/L (ref 135–145)

## 2013-01-11 LAB — HEPATIC FUNCTION PANEL
ALT: 26 U/L (ref 0–53)
AST: 58 U/L — ABNORMAL HIGH (ref 0–37)
Alkaline Phosphatase: 81 U/L (ref 39–117)
Bilirubin, Direct: 0.4 mg/dL — ABNORMAL HIGH (ref 0.0–0.3)
Total Bilirubin: 1.6 mg/dL — ABNORMAL HIGH (ref 0.3–1.2)
Total Protein: 7.9 g/dL (ref 6.0–8.3)

## 2013-01-11 LAB — MICROALBUMIN / CREATININE URINE RATIO: Creatinine,U: 409.9 mg/dL

## 2013-01-11 MED ORDER — GLUCOSE BLOOD VI STRP: ORAL_STRIP | Status: DC

## 2013-01-11 MED ORDER — FUROSEMIDE 40 MG PO TABS: 40.0000 mg | ORAL_TABLET | Freq: Every day | ORAL | Status: DC

## 2013-01-11 MED ORDER — FREESTYLE LANCETS MISC: Status: DC

## 2013-01-11 NOTE — Assessment & Plan Note (Addendum)
Renal and hepatic function will be rechecked. Support hose ordered ABI not clinically indicated

## 2013-01-11 NOTE — Patient Instructions (Signed)
Check glucose once daily if possible Fasting or morning glucose recommended M, W, F, & Sun if possible. Goal= 100-150 Glucose 2 hours after breakfast Tues, after lunch Thurs & 2 hrs after eve meal Sat if possible. Goal = < 180 Wear over the calf support hose if you are standing for prolonged periods of  time or working on your feet for prolonged periods of time.

## 2013-01-11 NOTE — Assessment & Plan Note (Addendum)
A1c & urine microalbumin.  Glucose monitoring will be initiated

## 2013-01-11 NOTE — Progress Notes (Signed)
   Subjective:    Patient ID: Patrick Kidd, male    DOB: 1945/12/25, 67 y.o.   MRN: [id number]  HPI  He's had edema for several months at least. This is also associated with some discoloration of his legs. His wife is concerned that this may represent peripheral vascular disease with associated neuropathy.  He is being followed by the neurologist for peripheral neuropathy with gait imbalance. That note was reviewed.  He's been found to have glucose intolerance with glucose of 341 two hours after a meal. The last fasting glucose was 131. There is no family history of diabetes.    Review of Systems  Polyuria reported w/o polyphagia or polydipsia . There is no blurred vision, double vision, or loss of vision.  No nonhealing skin lesions present. Weight is up 12 #.  He denies chest pain, palpitations, exertional dyspnea, or paroxysmal nocturnal dyspnea. He does sleep sitting up because of his active neck problems  His wife states he does snore. She does not know if he exhibits any apnea.       Objective:   Physical Exam Gen.: Weight excess. Alert, appropriate and cooperative throughout exam.   Head: Normocephalic without obvious abnormalities  Eyes: No corneal or conjunctival inflammation noted. Pupils equal round reactive to light and accommodation. Extraocular motion intact. No icterus  Nose: External nasal exam reveals no deformity or inflammation. Nasal mucosa are pink and moist. No lesions or exudates noted.   Mouth: Oral mucosa and oropharynx reveal no lesions or exudates. Teeth in good repair. Neck: No deformities, masses, or tenderness noted. Range of motion decreased. Thyroid substernal. Lungs: Normal respiratory effort; chest expands symmetrically. Lungs are clear to auscultation without rales, wheezes, or increased work of breathing. Heart: Normal rate and rhythm. Normal S1 and S2. No gallop, click, or rub. S4 w/o murmur. Abdomen: Bowel sounds normal; abdomen soft and  nontender. No masses or organomegaly .Ventral & umbilical hernias noted.                               Musculoskeletal/extremities: No deformity or scoliosis noted of  the thoracic or lumbar spine.  No clubbing, cyanosis or significant extremity  deformity noted. Tone & strength normal. 1+ pitting edema bilaterally. Hand joints normal . Fingernail / toenail health good. Able to lie down & sit up w/o help.  Vascular: Carotid, radial artery, dorsalis pedis and  posterior tibial pulses are full and equal. No bruits present. Neurologic: Alert and oriented x3. Deep tendon reflexes symmetrical but 0+ @ knees.  Gait normal  including heel & toe walking . Rhomberg & finger to nose       Skin: Intact without suspicious lesions or rashes. Stasis hyperpigmentation over the lower extremities.No ischemic skin changes present Lymph: No cervical, axillary lymphadenopathy present. Psych: Mood and affect are normal. Normally interactive                                                                                        Assessment & Plan:  See Current Assessment & Plan in Problem List under specific Diagnosis

## 2013-01-11 NOTE — Progress Notes (Signed)
Pre visit review using our clinic review tool, if applicable. No additional management support is needed unless otherwise documented below in the visit note. 

## 2013-01-13 ENCOUNTER — Ambulatory Visit: Payer: Medicare Other | Admitting: Physical Therapy

## 2013-01-18 ENCOUNTER — Ambulatory Visit: Payer: Medicare Other | Admitting: Physical Therapy

## 2013-01-20 ENCOUNTER — Ambulatory Visit: Payer: Medicare Other | Admitting: Physical Therapy

## 2013-01-25 ENCOUNTER — Ambulatory Visit: Payer: Medicare Other | Admitting: Physical Therapy

## 2013-01-31 ENCOUNTER — Ambulatory Visit: Payer: Medicare Other | Admitting: Physical Therapy

## 2013-02-01 ENCOUNTER — Telehealth: Payer: Self-pay | Admitting: Neurology

## 2013-02-01 MED ORDER — GABAPENTIN 300 MG PO CAPS: 300.0000 mg | ORAL_CAPSULE | Freq: Two times a day (BID) | ORAL | Status: DC

## 2013-02-01 NOTE — Telephone Encounter (Signed)
RX refill received via fax for gabapentin 300 mg.  RX prescribed at appt on 01/06/2013. I called patient and he states RX was never received by pharmacy. In EPIC RX documented as "printed". Patient stated he never received printed RX. Gabapentin RX sent electronically to [facility] per patient request. He will call with any additional problems.

## 2013-02-02 ENCOUNTER — Ambulatory Visit: Payer: Medicare Other | Admitting: Physical Therapy

## 2013-02-08 ENCOUNTER — Ambulatory Visit: Payer: Medicare Other | Attending: Neurology | Admitting: Physical Therapy

## 2013-02-08 DIAGNOSIS — R269 Unspecified abnormalities of gait and mobility: Secondary | ICD-10-CM | POA: Insufficient documentation

## 2013-02-08 DIAGNOSIS — IMO0001 Reserved for inherently not codable concepts without codable children: Secondary | ICD-10-CM | POA: Diagnosis not present

## 2013-02-10 ENCOUNTER — Ambulatory Visit: Payer: Medicare Other | Admitting: Physical Therapy

## 2013-02-15 ENCOUNTER — Encounter: Payer: Medicare Other | Admitting: Physical Therapy

## 2013-02-17 ENCOUNTER — Encounter: Payer: Medicare Other | Admitting: Physical Therapy

## 2013-03-14 ENCOUNTER — Encounter: Payer: Self-pay | Admitting: Internal Medicine

## 2013-03-14 ENCOUNTER — Ambulatory Visit (INDEPENDENT_AMBULATORY_CARE_PROVIDER_SITE_OTHER): Payer: Medicare Other | Admitting: Internal Medicine

## 2013-03-14 VITALS — BP 128/60 | HR 100 | Temp 98.4°F | Wt 269.0 lb

## 2013-03-14 DIAGNOSIS — B351 Tinea unguium: Secondary | ICD-10-CM

## 2013-03-14 DIAGNOSIS — R7309 Other abnormal glucose: Secondary | ICD-10-CM | POA: Diagnosis not present

## 2013-03-14 DIAGNOSIS — R609 Edema, unspecified: Secondary | ICD-10-CM | POA: Diagnosis not present

## 2013-03-14 NOTE — Progress Notes (Signed)
Subjective:    Patient ID: Patrick Kidd, male    DOB: 22-May-1945, 68 y.o.   MRN: [id number]  HPI Hyperglycemia status assessment: Fasting or morning glucose range 91-129. Highest glucose 2 hours after any meal is 150 X 2 , usually 131-137. No hypoglycemia reported                                                                                                                 Regular exercise described as 3  times per week for @ gym On 17 day diet No medication for glucose. Eye exam current. Foot care not current    Review of Systems No excess thirst ;  excess hunger ; or excess urination reported                              No lightheadedness with standing reported No chest pain ; palpitations ; claudication described .                                                                                                                             No non healing skin  ulcers or sores of extremities noted. Gabapentin helps burning in feet described                                                                                                                                             No significant change in weight (up 5#) No blurred,double, or loss of vision reported  .  Throbbing R great toe nail X 1 year constantly          Objective:   Physical Exam Gen.: obese. Alert, appropriate and cooperative throughout exam.  Head: Normocephalic without obvious abnormalities; pattern alopecia  Eyes: No corneal or conjunctival inflammation noted. Pupils equal round reactive to  light and accommodation. Extraocular motion intact. Nose: External nasal exam reveals no deformity or inflammation. Nasal mucosa are pink and moist. No lesions or exudates noted.   Mouth: Oral mucosa and oropharynx reveal no lesions or exudates. Teeth in good repair. Neck: No deformities, masses, or tenderness noted. Thyroid normal. Lungs: Normal respiratory effort; chest expands symmetrically. Lungs are clear to  auscultation without rales, wheezes, or increased work of breathing. Heart: Normal rate and rhythm. Normal S1 and S2. No gallop, click, or rub. S4 w/o murmur. Abdomen: Massive.Bowel sounds normal; abdomen soft and nontender. No masses, organomegaly or hernias noted.                                 Musculoskeletal/extremities:No clubbing, cyanosis, or significant extremity  deformity noted. Range of motion normal .Tone & strength normal.1/2+ edema Hand joints normal .Severe discoloration R great toe nail laterally toenail health good. Able to lie down & sit up w/o help.  Vascular: Carotid, radial artery, dorsalis pedis and  posterior tibial pulses are full and equal. No bruits present. Neurologic: Alert and oriented x3. Deep tendon reflexes symmetrical and normal.  Light touch normal over feet    Skin: Intact without suspicious lesions or rashes.Stasis changes of feet Lymph: No cervical, axillary lymphadenopathy present. Psych: Mood and affect are normal. Normally interactive                                                                                        Assessment & Plan:  See Current Assessment & Plan in Problem List under specific Diagnosis

## 2013-03-14 NOTE — Patient Instructions (Signed)
Follow the low carb nutrition program in [facility] [place] as closely as possible to prevent Diabetes progression & complications. White carbohydrates (potatoes, rice, bread, and pasta) have a high spike of sugar and a high load of sugar. For example a  baked potato has a cup of sugar and a  french fry  2 teaspoons of sugar. Yams, wild  rice, whole grained bread &  wheat pasta have been much lower spike and load of  sugar.

## 2013-03-14 NOTE — Progress Notes (Signed)
Pre visit review using our clinic review tool, if applicable. No additional management support is needed unless otherwise documented below in the visit note. 

## 2013-03-24 ENCOUNTER — Ambulatory Visit (INDEPENDENT_AMBULATORY_CARE_PROVIDER_SITE_OTHER): Payer: Medicare Other | Admitting: Podiatry

## 2013-03-24 ENCOUNTER — Encounter: Payer: Self-pay | Admitting: Podiatry

## 2013-03-24 VITALS — BP 162/82 | HR 84 | Resp 16

## 2013-03-24 DIAGNOSIS — L608 Other nail disorders: Secondary | ICD-10-CM | POA: Diagnosis not present

## 2013-03-24 DIAGNOSIS — L03039 Cellulitis of unspecified toe: Secondary | ICD-10-CM | POA: Diagnosis not present

## 2013-03-24 DIAGNOSIS — L609 Nail disorder, unspecified: Secondary | ICD-10-CM | POA: Diagnosis not present

## 2013-03-24 NOTE — Progress Notes (Signed)
Subjective:     Patient ID: Patrick Kidd, male   DOB: March 14, 1945, 67 y.o.   MRN: [id number]  HPI patient points to right big toe stating the nail has discolored in the last 6 months and it is somewhat painful on the lateral side. Does not remember specific injury   Review of Systems     Objective:   Physical Exam Vascular status intact with diminishment of neurological status as he does have neuropathy. Right hallux lateral side does have discoloration in the lateral border and slightly central on the proximal portion. It is mildly tender when pressed    Assessment:     Probable paronychia but cannot rule out soft tissue issue secondary to discoloration of nail bed    Plan:     Infiltrated the right hallux 60 mg Xylocaine Marcaine mixture and using sterile his mentation remove the lateral border and place this for pathological evaluation and ruling out soft tissue tumor. Clean the area out applied sterile dressing and reappoint her recheck

## 2013-03-24 NOTE — Patient Instructions (Signed)

## 2013-03-25 NOTE — Progress Notes (Signed)
Biopsy of right lateral 1st toe r/o melanoma sent to [facility] for definitive results.

## 2013-04-07 ENCOUNTER — Ambulatory Visit (INDEPENDENT_AMBULATORY_CARE_PROVIDER_SITE_OTHER): Payer: Medicare Other | Admitting: Neurology

## 2013-04-07 ENCOUNTER — Encounter: Payer: Self-pay | Admitting: Neurology

## 2013-04-07 VITALS — BP 132/70 | HR 68 | Temp 98.5°F | Resp 18 | Ht 74.0 in | Wt 271.9 lb

## 2013-04-07 DIAGNOSIS — G609 Hereditary and idiopathic neuropathy, unspecified: Secondary | ICD-10-CM | POA: Diagnosis not present

## 2013-04-07 DIAGNOSIS — M792 Neuralgia and neuritis, unspecified: Secondary | ICD-10-CM

## 2013-04-07 DIAGNOSIS — G5790 Unspecified mononeuropathy of unspecified lower limb: Secondary | ICD-10-CM | POA: Diagnosis not present

## 2013-04-07 NOTE — Patient Instructions (Signed)
1.  Continue the gabapentin three times daily. 2.  Consider putting capsaicin cream on your foot where it is painful.  Can pick up at [facility] or any pharmacy 3.  Will check blood work 4.  Follow up in 6 months.

## 2013-04-07 NOTE — Progress Notes (Signed)
NEUROLOGY FOLLOW UP OFFICE NOTE  Patrick Kidd [id number]  HISTORY OF PRESENT ILLNESS: Patrick Kidd is a 68 y.o. right-handed male who follows up for peripheral neuropathy.  He is accompanied by his wife.  Records and images were personally reviewed where available.    UPDATE: TID and BID dosing of gabapentin 324m made him tired, so last visit, he was advised to continue the bedtime dose daily and reserve the daytime dose as needed.  However, due to the pain, he is back on TID dosing, which controls the pain.  He feels sleepy a little, but it is manageable.  [place], he won't take the gabapentin.  Pain is mostly in that toe where he had the trauma.  He feels that at times he has numbness in the ankles too, as well as fingertips.  He went to PT and has been doing better in regards to neck and balance.  Has discoloration in the right big toe.  Has seen podiatry.  Likely fungal but biopsy was taken and is pending.  HISTORY: Onset of symptoms began in 2010.  He describes initially experiencing numbness on the bottom of both feet, in which recently it has spread to the ankles. It is also associated with an uncomfortable tingling sensation. It is not painful but it is uncomfortable and usually bothers him at night.  He doesn't really notice it if he is active.  He is concerned about his balance.  The first time, he was getting out of a boat and felt that his right leg was not strong enough to hold his weight and he fell to his knees. The second time, he tripped over a chair and fell down to his knees.  He felt that his leg just gave out when it paired his weight. He is particularly concerned, because he does enjoy riding on a boat and dancing.  More recently, he stood up from a chair at a game and felt shaky and unsteady.  He has not been exercising as much due to fear of imbalance.  He denies any pain radiating down the legs. About 18 months  ago, an 8 pound frozen tuck breast fell on his right big toe and has continued discomfort and numbness in the toe.  He also notes tingling in his fingertips as well.  Labs from 08/04/12 revealed ESR 29, ANA negative, Lyme negative.  SPEP revealed possible faint restricted band in the gamma region, IFE revealed polyclonal gammopathy involving IgA and IgM.  Prior labs last month revealed Hgb A1c 5.7, TSH 2.42, RPR non-reactive, serum glucose 131.  CBC and CMP otherwise unremarkable.  B12 from 5/12 was 1293.  Fasting glucose was 143 and 2 hour glucose tolerance test was 341.  Labs from 01/11/13 revealed Hgb A1c of 5.8.  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  He was started gabapentin 3068m    In November, he starting having neck pain.  It radiates down the right side of his neck and into the shoulder.  It is an aching and shooting type pain.  It is worse when laying in bed and looking up.  No shooting pain down the arm.  He denies any strain or trauma to the neck.    PAST MEDICAL HISTORY: Past Medical History  Diagnosis Date  . Gout   . Chronic cough     PMH of  . GERD (gastroesophageal reflux disease)   .  Peripheral neuropathy   . Diverticulosis 07-03-2010    Colonoscopy.   Marland Kitchen Hx of adenomatous colonic polyps 07-03-2010    Colonoscopy. F/U due 2017  . Plantar fasciitis     PMH of  . Hydrocele   . Acrophobia   . Nonspecific elevation of levels of transaminase or lactic acid dehydrogenase (LDH)   . Neck pain     MEDICATIONS: Current Outpatient Prescriptions on File Prior to Visit  Medication Sig Dispense Refill  . furosemide (LASIX) 40 MG tablet Take 1 tablet (40 mg total) by mouth daily.  30 tablet  5  . gabapentin (NEURONTIN) 300 MG capsule Take 1 capsule (300 mg total) by mouth 2 (two) times daily.  60 capsule  9  . glucose blood test strip Test blood sugar once daily  100 each  12  . Lancets (FREESTYLE) lancets Test blood  sugar once daily.  100 each  12  . Multiple Vitamin (MULTIVITAMIN) tablet Take 1 tablet by mouth daily.      . sildenafil (VIAGRA) 100 MG tablet Take 1 tablet (100 mg total) by mouth daily as needed. 1/2-1 by mouth as needed  6 tablet  6   Current Facility-Administered Medications on File Prior to Visit  Medication Dose Route Frequency Provider Last Rate Last Dose  . 0.9 %  sodium chloride infusion  500 mL Intravenous Continuous Gatha Mayer, MD        ALLERGIES: No Known Allergies  FAMILY HISTORY: Family History  Problem Relation Age of Onset  . Lung cancer Mother     smoker  . Leukemia Father     Acute myelocytic  . Diabetes Neg Hx   . Stroke Neg Hx   . Heart disease Neg Hx     SOCIAL HISTORY: History   Social History  . Marital Status: Married    Spouse Name: N/A    Number of Children: 2  . Years of Education: N/A   Occupational History  . Commercial real estate    Social History Main Topics  . Smoking status: Former Smoker -- 2.00 packs/day for 6 years    Types: Cigarettes    Quit date: 02/04/1971  . Smokeless tobacco: Never Used     Comment: smoked age [age], up to 2 ppd  . Alcohol Use: 12.6 oz/week    21 Shots of liquor per week     Comment: 2-3 a day  . Drug Use: No  . Sexual Activity: Not on file   Other Topics Concern  . Not on file   Social History Narrative  . No narrative on file    REVIEW OF SYSTEMS: Constitutional: No fevers, chills, or sweats, no generalized fatigue, change in appetite Eyes: No visual changes, double vision, eye pain Ear, nose and throat: No hearing loss, ear pain, nasal congestion, sore throat Cardiovascular: No chest pain, palpitations Respiratory:  No shortness of breath at rest or with exertion, wheezes GastrointestinaI: No nausea, vomiting, diarrhea, abdominal pain, fecal incontinence Genitourinary:  No dysuria, urinary retention or frequency Musculoskeletal:  No neck pain, back pain Integumentary: No rash,  pruritus, skin lesions Neurological: as above Psychiatric: No depression, insomnia, anxiety Endocrine: No palpitations, fatigue, diaphoresis, mood swings, change in appetite, change in weight, increased thirst Hematologic/Lymphatic:  No anemia, purpura, petechiae. Allergic/Immunologic: no itchy/runny eyes, nasal congestion, recent allergic reactions, rashes  PHYSICAL EXAM: Filed Vitals:   04/07/13 1039  BP: 132/70  Pulse: 68  Temp: 98.5 F (36.9 C)  Resp: 18   General:  No acute distress Head:  Normocephalic/atraumatic Neck: supple, no paraspinal tenderness, full range of motion Heart:  Regular rate and rhythm Lungs:  Clear to auscultation bilaterally Back: No paraspinal tenderness Neurological Exam: alert and oriented to person, place, and time. Speech fluent and not dysarthric, language intact.  CN II-XII intact.  Bulk and tone normal, muscle strength 5/5 throughout.  Pinprick sensation reduced bilaterally up to the ankles.  Reduced vibration sensation in first digit of right foot. Deep tendon reflexes 2+ UEs, 1+ patellars, absent at ankles, toes downgoing.  Finger to nose and heel to shin testing intact.  Gait mildly wide-based and cautious.  Sways a little with tandem walk, but steady, Romberg negative.  IMPRESSION: Idiopathic peripheral neuropathy.  Not clear if due to impaired glucose tolerance. Neuralgia of right first toe.  PLAN: 1.  Gabapentin 344m TID 2.  Consider using capsaicin cream on foot while driving to the . 3.  Will repeat IFE given prior polyclonal gammopathy. 4.  Follow up in 6 months or as needed.  AMetta Clines DO  CC:  WUnice Cobble MD

## 2013-04-11 LAB — SPEP & IFE WITH QIG
Albumin ELP: 55.3 % — ABNORMAL LOW (ref 55.8–66.1)
Alpha-1-Globulin: 4.7 % (ref 2.9–4.9)
Alpha-2-Globulin: 9.9 % (ref 7.1–11.8)
BETA 2: 5.2 % (ref 3.2–6.5)
Beta Globulin: 6.3 % (ref 4.7–7.2)
Gamma Globulin: 18.6 % (ref 11.1–18.8)
IGA: 574 mg/dL — AB (ref 68–379)
IGG (IMMUNOGLOBIN G), SERUM: 1040 mg/dL (ref 650–1600)
IGM, SERUM: 302 mg/dL — AB (ref 41–251)
Total Protein, Serum Electrophoresis: 7.1 g/dL (ref 6.0–8.3)

## 2013-05-18 ENCOUNTER — Encounter: Payer: Self-pay | Admitting: Neurology

## 2013-07-05 ENCOUNTER — Encounter: Payer: Self-pay | Admitting: Podiatry

## 2013-07-18 ENCOUNTER — Encounter: Payer: Self-pay | Admitting: Internal Medicine

## 2013-07-18 ENCOUNTER — Ambulatory Visit (INDEPENDENT_AMBULATORY_CARE_PROVIDER_SITE_OTHER): Payer: Medicare Other | Admitting: Internal Medicine

## 2013-07-18 ENCOUNTER — Other Ambulatory Visit (INDEPENDENT_AMBULATORY_CARE_PROVIDER_SITE_OTHER): Payer: Medicare Other

## 2013-07-18 VITALS — BP 140/80 | HR 95 | Temp 98.3°F | Resp 14 | Ht 74.0 in | Wt 270.4 lb

## 2013-07-18 DIAGNOSIS — G609 Hereditary and idiopathic neuropathy, unspecified: Secondary | ICD-10-CM

## 2013-07-18 DIAGNOSIS — D126 Benign neoplasm of colon, unspecified: Secondary | ICD-10-CM

## 2013-07-18 DIAGNOSIS — Z860101 Personal history of adenomatous and serrated colon polyps: Secondary | ICD-10-CM

## 2013-07-18 DIAGNOSIS — E8881 Metabolic syndrome: Secondary | ICD-10-CM

## 2013-07-18 DIAGNOSIS — D890 Polyclonal hypergammaglobulinemia: Secondary | ICD-10-CM

## 2013-07-18 DIAGNOSIS — R7302 Impaired glucose tolerance (oral): Secondary | ICD-10-CM

## 2013-07-18 DIAGNOSIS — N3941 Urge incontinence: Secondary | ICD-10-CM

## 2013-07-18 DIAGNOSIS — G629 Polyneuropathy, unspecified: Secondary | ICD-10-CM

## 2013-07-18 DIAGNOSIS — R7309 Other abnormal glucose: Secondary | ICD-10-CM

## 2013-07-18 DIAGNOSIS — Z86010 Personal history of colonic polyps: Secondary | ICD-10-CM

## 2013-07-18 LAB — HEPATIC FUNCTION PANEL
ALT: 25 U/L (ref 0–53)
AST: 49 U/L — AB (ref 0–37)
Albumin: 3.8 g/dL (ref 3.5–5.2)
Alkaline Phosphatase: 69 U/L (ref 39–117)
BILIRUBIN DIRECT: 0.5 mg/dL — AB (ref 0.0–0.3)
TOTAL PROTEIN: 7.1 g/dL (ref 6.0–8.3)
Total Bilirubin: 1.3 mg/dL — ABNORMAL HIGH (ref 0.2–1.2)

## 2013-07-18 LAB — BASIC METABOLIC PANEL
BUN: 10 mg/dL (ref 6–23)
CHLORIDE: 98 meq/L (ref 96–112)
CO2: 30 mEq/L (ref 19–32)
Calcium: 9.4 mg/dL (ref 8.4–10.5)
Creatinine, Ser: 0.8 mg/dL (ref 0.4–1.5)
GFR: 96.37 mL/min (ref >60.00)
Glucose, Bld: 174 mg/dL — ABNORMAL HIGH (ref 70–99)
Potassium: 4.3 mEq/L (ref 3.5–5.1)
SODIUM: 137 meq/L (ref 135–145)

## 2013-07-18 LAB — HEMOGLOBIN A1C: Hgb A1c MFr Bld: 6.1 % (ref 4.6–6.5)

## 2013-07-18 LAB — CBC WITH DIFFERENTIAL/PLATELET
BASOS PCT: 0.4 % (ref 0.0–3.0)
Basophils Absolute: 0 10*3/uL (ref 0.0–0.1)
EOS ABS: 0.1 10*3/uL (ref 0.0–0.7)
Eosinophils Relative: 1 % (ref 0.0–5.0)
HCT: 45.1 % (ref 39.0–52.0)
HEMOGLOBIN: 15.7 g/dL (ref 13.0–17.0)
LYMPHS PCT: 26 % (ref 12.0–46.0)
Lymphs Abs: 1.7 10*3/uL (ref 0.7–4.0)
MCHC: 34.8 g/dL (ref 30.0–36.0)
MCV: 104.5 fl — AB (ref 78.0–100.0)
Monocytes Absolute: 0.8 10*3/uL (ref 0.1–1.0)
Monocytes Relative: 11.5 % (ref 3.0–12.0)
Neutro Abs: 4.1 10*3/uL (ref 1.4–7.7)
Neutrophils Relative %: 61.1 % (ref 43.0–77.0)
Platelets: 98 10*3/uL — ABNORMAL LOW (ref 150.0–400.0)
RBC: 4.32 Mil/uL (ref 4.22–5.81)
RDW: 13.9 % (ref 11.5–15.5)
WBC: 6.7 10*3/uL (ref 4.0–10.5)

## 2013-07-18 LAB — MICROALBUMIN / CREATININE URINE RATIO
CREATININE, U: 282.1 mg/dL
Microalb Creat Ratio: 1.1 mg/g (ref 0.0–30.0)
Microalb, Ur: 3 mg/dL — ABNORMAL HIGH (ref 0.0–1.9)

## 2013-07-18 LAB — TSH: TSH: 1.53 u[IU]/mL (ref 0.35–4.50)

## 2013-07-18 MED ORDER — SILDENAFIL CITRATE 20 MG PO TABS: ORAL_TABLET | ORAL | Status: DC

## 2013-07-18 NOTE — Patient Instructions (Signed)
Your next office appointment will be determined based upon review of your pending labs Those instructions will be transmitted to you by mail. Followup as needed for your acute issue. Please report any significant change in your symptoms. Alternatively cheaper therapeutically equivalent option for Viagra is available from  [facility] at [phone].  I recommend a Urology consultation to determine optimal therapy.

## 2013-07-18 NOTE — Assessment & Plan Note (Signed)
SPE

## 2013-07-18 NOTE — Assessment & Plan Note (Signed)
A1c & urine microalbumin  

## 2013-07-18 NOTE — Progress Notes (Signed)
Subjective:    Patient ID: Patrick Kidd, male    DOB: 15-Feb-1945, 68 y.o.   MRN: [id number]  HPI He is here to assess active health issues & conditions. PMH, FH, & Social history verified & updated                                                                                                               Eexercise described as 1-2 times per week @ gym for 45 minutes. No specific nutrition/diet followed No medication adverse effects noted. Eye exam not current.    Review of Systems  No excess thirst ;  excess hunger ; or excess urination reported. Some urgency & incontinence.                         No lightheadedness with standing reported No chest pain ; palpitations ; claudication described .       DOE dancing or with golf.                                                                                                                       No non healing skin  ulcers or sores of extremities noted. No numbness or tingling or burning in feet described                                                                                                                                             No significant change in weight . No blurred,double, or loss of vision reported  .           Objective:   Physical Exam Gen.: adequately nourished in appearance. Alert, appropriate and cooperative throughout exam. Obesity present as per CDC Guidelines Head: Normocephalic without obvious abnormalities Eyes: No corneal or conjunctival inflammation noted. Pupils equal round reactive to light and accommodation. Extraocular motion intact. Ears: External  ear exam reveals no significant lesions or deformities. Canals clear .TMs normal. Hearing is grossly normal bilaterally. Nose: External nasal exam reveals no deformity or inflammation. Nasal mucosa are pink and moist. No lesions or exudates noted.   Mouth: Oral mucosa and oropharynx reveal no lesions or exudates. Teeth in good repair. Neck:  No deformities, masses, or tenderness noted. Range of motion & Thyroid normal Lungs: Normal respiratory effort; chest expands symmetrically. Lungs are clear to auscultation without rales, wheezes, or increased work of breathing. Heart: Normal rate and rhythm. Normal S1 and S2. No gallop, click, or rub. No murmur. Abdomen: Massive abdomen.Bowel sounds normal; abdomen soft and nontender. No masses or  Organomegaly. Large ventral hernia noted. Genitalia: Genitalia normal except for large R hydrocele. Prostate is normal without enlargement, asymmetry, nodularity, or induration                               Musculoskeletal/extremities: No deformity or scoliosis noted of  the thoracic or lumbar spine.  No clubbing, cyanosis or significant extremity  deformity noted. 1/2-1+ ankle edema.Range of motion normal .Tone & strength normal. Hand joints normal Fingernail health good. Able to lie down & sit up w/o help. Negative SLR bilaterally Vascular: Carotid, radial artery, dorsalis pedis and  posterior tibial pulses are full and equal. No bruits present. Neurologic: Alert and oriented x3. Deep tendon reflexes symmetrical but 0-1/2+ @ knees.  Gait normal       Skin: Intact without suspicious lesions or rashes. Stasis ankle skin changes. Lymph: No cervical, axillary, or inguinal lymphadenopathy present. Psych: Mood and affect are normal. Normally interactive                                                                                        Assessment & Plan:  See Current Assessment & Plan in Problem List under specific DiagnosisThe labs will be reviewed and risks and options assessed. Written recommendations will be provided by mail or directly through My Chart.Further evaluation or change in medical therapy will be directed by those results.

## 2013-07-18 NOTE — Progress Notes (Signed)
Pre visit review using our clinic review tool, if applicable. No additional management support is needed unless otherwise documented below in the visit note. 

## 2013-07-19 NOTE — Assessment & Plan Note (Signed)
CBC & dif 

## 2013-07-20 LAB — PROTEIN ELECTROPHORESIS, SERUM
ALBUMIN ELP: 54 % — AB (ref 55.8–66.1)
Alpha-1-Globulin: 4.7 % (ref 2.9–4.9)
Alpha-2-Globulin: 10 % (ref 7.1–11.8)
BETA 2: 6.1 % (ref 3.2–6.5)
BETA GLOBULIN: 6.5 % (ref 4.7–7.2)
GAMMA GLOBULIN: 18.7 % (ref 11.1–18.8)
Total Protein, Serum Electrophoresis: 7 g/dL (ref 6.0–8.3)

## 2013-07-28 ENCOUNTER — Telehealth: Payer: Self-pay | Admitting: *Deleted

## 2013-07-28 MED ORDER — SILDENAFIL CITRATE 20 MG PO TABS: ORAL_TABLET | ORAL | Status: DC

## 2013-07-28 NOTE — Telephone Encounter (Signed)
Left msg on triage stating Dr. Linna Darner was suppose to send the alternative for viagra over to [facility]. They have never received script. pls send. Notified pt inform him we will resend to correct pharmacy. MD sent to [facility]....Patrick Kidd

## 2013-08-10 DIAGNOSIS — R351 Nocturia: Secondary | ICD-10-CM | POA: Diagnosis not present

## 2013-08-10 DIAGNOSIS — R350 Frequency of micturition: Secondary | ICD-10-CM | POA: Diagnosis not present

## 2013-08-10 DIAGNOSIS — N3941 Urge incontinence: Secondary | ICD-10-CM | POA: Diagnosis not present

## 2013-09-07 DIAGNOSIS — R351 Nocturia: Secondary | ICD-10-CM | POA: Diagnosis not present

## 2013-09-07 DIAGNOSIS — N3941 Urge incontinence: Secondary | ICD-10-CM | POA: Diagnosis not present

## 2013-10-11 ENCOUNTER — Encounter: Payer: Self-pay | Admitting: *Deleted

## 2013-10-11 ENCOUNTER — Ambulatory Visit: Payer: Medicare Other | Admitting: Neurology

## 2013-10-11 NOTE — Progress Notes (Signed)
No show letter sent 10-11-13

## 2013-10-12 ENCOUNTER — Telehealth: Payer: Self-pay | Admitting: Neurology

## 2013-10-12 NOTE — Telephone Encounter (Signed)
Pt no showed appt w/ Dr. Tomi Likens 10/11/13. No show letter mailed to pt / Sherri S.

## 2013-11-08 ENCOUNTER — Encounter: Payer: Self-pay | Admitting: Neurology

## 2013-11-08 ENCOUNTER — Ambulatory Visit (INDEPENDENT_AMBULATORY_CARE_PROVIDER_SITE_OTHER): Payer: Medicare Other | Admitting: Neurology

## 2013-11-08 VITALS — BP 132/74 | HR 76 | Resp 18 | Wt 270.3 lb

## 2013-11-08 DIAGNOSIS — G609 Hereditary and idiopathic neuropathy, unspecified: Secondary | ICD-10-CM | POA: Diagnosis not present

## 2013-11-08 MED ORDER — GABAPENTIN 300 MG PO CAPS: 300.0000 mg | ORAL_CAPSULE | Freq: Three times a day (TID) | ORAL | Status: DC

## 2013-11-08 NOTE — Patient Instructions (Signed)
Continue gabapentin 300mg  three times daily.  Called in prescription with 11 refills Follow up in 9 months.

## 2013-11-08 NOTE — Progress Notes (Signed)
NEUROLOGY FOLLOW UP OFFICE NOTE  [name] [id number]  HISTORY OF PRESENT ILLNESS: Patrick Kidd is a 68 y.o. right-handed male who follows up for peripheral neuropathy.     UPDATE: He is doing well.  The current dose of gabapentin controls the pain.  He feels going any higher on the dose will make him too sleepy.  He found that PT has helped with his balance and walking.  HISTORY: Onset of symptoms began in 2010.  He describes initially experiencing numbness on the bottom of both feet, in which recently it has spread to the ankles. It is also associated with an uncomfortable tingling sensation. It is not painful but it is uncomfortable and usually bothers him at night.  He doesn't really notice it if he is active.  He is concerned about his balance.  The first time, he was getting out of a boat and felt that his right leg was not strong enough to hold his weight and he fell to his knees. The second time, he tripped over a chair and fell down to his knees.  He felt that his leg just gave out when it paired his weight. He is particularly concerned, because he does enjoy riding on a boat and dancing.  More recently, he stood up from a chair at a game and felt shaky and unsteady.  He has not been exercising as much due to fear of imbalance.  He denies any pain radiating down the legs.  He has pain in his right big toe after an 8 pound frozen duck breast fell on it.  He has tingling in the finger tips.  Labs from 08/04/12 revealed ESR 29, ANA negative, Lyme negative.  SPEP revealed possible faint restricted band in the gamma region, IFE revealed polyclonal gammopathy involving IgA and IgM.  Prior labs last month revealed Hgb A1c 5.7, TSH 2.42, RPR non-reactive, serum glucose 131.  CBC and CMP otherwise unremarkable.  B12 from 5/12 was 1293.  Fasting glucose was 143 and 2 hour glucose tolerance test was 341.  Labs from 01/11/13 revealed Hgb A1c of 5.8.  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  He was started gabapentin 355m three times daily.    PAST MEDICAL HISTORY: Past Medical History  Diagnosis Date  . Gout   . Chronic cough     PMH of  . GERD (gastroesophageal reflux disease)   . Peripheral neuropathy   . Diverticulosis 07-03-2010    Colonoscopy.   .Marland KitchenHx of adenomatous colonic polyps 07-03-2010    Colonoscopy. F/U due 2017  . Plantar fasciitis     PMH of  . Hydrocele   . Acrophobia   . Nonspecific elevation of levels of transaminase or lactic acid dehydrogenase (LDH)     MEDICATIONS: Current Outpatient Prescriptions on File Prior to Visit  Medication Sig Dispense Refill  . furosemide (LASIX) 40 MG tablet Take 40 mg by mouth as needed.      . Multiple Vitamin (MULTIVITAMIN) tablet Take 1 tablet by mouth daily.      . sildenafil (REVATIO) 20 MG tablet 3 qd prn  90 tablet  2  . sildenafil (VIAGRA) 100 MG tablet Take 1 tablet (100 mg total) by mouth daily as needed. 1/2-1 by mouth as needed  6 tablet  6   Current Facility-Administered Medications on File Prior to Visit  Medication Dose Route Frequency Provider Last Rate Last Dose  . 0.9 %  sodium chloride infusion  500 mL Intravenous Continuous Gatha Mayer, MD        ALLERGIES: No Known Allergies  FAMILY HISTORY: Family History  Problem Relation Age of Onset  . Lung cancer Mother     smoker  . Leukemia Father     Acute myelocytic  . Diabetes Neg Hx   . Stroke Neg Hx   . Heart disease Neg Hx     SOCIAL HISTORY: History   Social History  . Marital Status: Married    Spouse Name: N/A    Number of Children: 2  . Years of Education: N/A   Occupational History  . Commercial real estate    Social History Main Topics  . Smoking status: Former Smoker -- 2.00 packs/day for 6 years    Types: Cigarettes    Quit date: 02/04/1971  . Smokeless tobacco: Never Used     Comment: smoked age [age], up to 2 ppd  . Alcohol Use: 12.6  oz/week    21 Shots of liquor per week     Comment: 2-3 a day  . Drug Use: No  . Sexual Activity: Yes    Partners: Female   Other Topics Concern  . Not on file   Social History Narrative  . No narrative on file    REVIEW OF SYSTEMS: Constitutional: No fevers, chills, or sweats, no generalized fatigue, change in appetite Eyes: No visual changes, double vision, eye pain Ear, nose and throat: No hearing loss, ear pain, nasal congestion, sore throat Cardiovascular: No chest pain, palpitations Respiratory:  No shortness of breath at rest or with exertion, wheezes GastrointestinaI: No nausea, vomiting, diarrhea, abdominal pain, fecal incontinence Genitourinary:  No dysuria, urinary retention or frequency Musculoskeletal:  No neck pain, back pain Integumentary: No rash, pruritus, skin lesions Neurological: as above Psychiatric: No depression, insomnia, anxiety Endocrine: No palpitations, fatigue, diaphoresis, mood swings, change in appetite, change in weight, increased thirst Hematologic/Lymphatic:  No anemia, purpura, petechiae. Allergic/Immunologic: no itchy/runny eyes, nasal congestion, recent allergic reactions, rashes  PHYSICAL EXAM: Filed Vitals:   11/08/13 1538  BP: 132/74  Pulse: 76  Resp: 18   General: No acute distress Head:  Normocephalic/atraumatic Neck: supple, no paraspinal tenderness, full range of motion Heart:  Regular rate and rhythm Lungs:  Clear to auscultation bilaterally Back: No paraspinal tenderness Neurological Exam: alert and oriented to person, place, and time. Attention span and concentration intact, recent and remote memory intact, fund of knowledge intact.  Speech fluent and not dysarthric, language intact.  CN II-XII intact. Fundoscopic exam unremarkable without vessel changes, exudates, hemorrhages or papilledema.  Bulk and tone normal, muscle strength 5/5 throughout.  Reduced pinprick, vibration in the feet up to the ankles.  Reduced proprioception  in the toes.  Deep tendon reflexes 1+ in upper extremities and absent in lower extremities, toes downgoing.  Mottling of the feet noted.  Finger to nose intact.  Gait wide based.  Able to turn.  Some difficulty walking in tandem, Romberg negative.  IMPRESSION: Idiopathic peripheral neuropathy  PLAN: 1.  Continue gabapentin 381m three times daily 2.  Follow up in 9 months.  AMetta Clines DO  CC:  WUnice Cobble MD

## 2014-01-05 ENCOUNTER — Other Ambulatory Visit: Payer: Self-pay | Admitting: Dermatology

## 2014-01-05 DIAGNOSIS — L820 Inflamed seborrheic keratosis: Secondary | ICD-10-CM | POA: Diagnosis not present

## 2014-01-05 DIAGNOSIS — D485 Neoplasm of uncertain behavior of skin: Secondary | ICD-10-CM | POA: Diagnosis not present

## 2014-01-05 DIAGNOSIS — L821 Other seborrheic keratosis: Secondary | ICD-10-CM | POA: Diagnosis not present

## 2014-01-06 ENCOUNTER — Encounter: Payer: Self-pay | Admitting: Internal Medicine

## 2014-01-06 ENCOUNTER — Ambulatory Visit (INDEPENDENT_AMBULATORY_CARE_PROVIDER_SITE_OTHER): Payer: Medicare Other | Admitting: Internal Medicine

## 2014-01-06 ENCOUNTER — Ambulatory Visit (INDEPENDENT_AMBULATORY_CARE_PROVIDER_SITE_OTHER)
Admission: RE | Admit: 2014-01-06 | Discharge: 2014-01-06 | Disposition: A | Discharge status: Home / Self Care | Payer: Medicare Other | Source: Ambulatory Visit | Attending: Internal Medicine | Admitting: Internal Medicine

## 2014-01-06 VITALS — BP 144/60 | HR 80 | Temp 98.5°F | Resp 16 | Ht 74.0 in | Wt 269.0 lb

## 2014-01-06 DIAGNOSIS — J202 Acute bronchitis due to streptococcus: Secondary | ICD-10-CM | POA: Diagnosis not present

## 2014-01-06 DIAGNOSIS — R05 Cough: Secondary | ICD-10-CM

## 2014-01-06 DIAGNOSIS — R059 Cough, unspecified: Secondary | ICD-10-CM | POA: Insufficient documentation

## 2014-01-06 DIAGNOSIS — R0602 Shortness of breath: Secondary | ICD-10-CM | POA: Diagnosis not present

## 2014-01-06 MED ORDER — CEFUROXIME AXETIL 500 MG PO TABS: 500.0000 mg | ORAL_TABLET | Freq: Two times a day (BID) | ORAL | Status: DC

## 2014-01-06 MED ORDER — HYDROCODONE-HOMATROPINE 5-1.5 MG/5ML PO SYRP: 5.0000 mL | ORAL_SOLUTION | Freq: Three times a day (TID) | ORAL | Status: DC | PRN

## 2014-01-06 NOTE — Patient Instructions (Signed)
Cough, Adult  A cough is a reflex that helps clear your throat and airways. It can help heal the body or may be a reaction to an irritated airway. A cough may only last 2 or 3 weeks (acute) or may last more than 8 weeks (chronic).  CAUSES Acute cough:  Viral or bacterial infections. Chronic cough:  Infections.  Allergies.  Asthma.  Post-nasal drip.  Smoking.  Heartburn or acid reflux.  Some medicines.  Chronic lung problems (COPD).  Cancer. SYMPTOMS   Cough.  Fever.  Chest pain.  Increased breathing rate.  High-pitched whistling sound when breathing (wheezing).  Colored mucus that you cough up (sputum). TREATMENT   A bacterial cough may be treated with antibiotic medicine.  A viral cough must run its course and will not respond to antibiotics.  Your caregiver may recommend other treatments if you have a chronic cough. HOME CARE INSTRUCTIONS   Only take over-the-counter or prescription medicines for pain, discomfort, or fever as directed by your caregiver. Use cough suppressants only as directed by your caregiver.  Use a cold steam vaporizer or humidifier in your bedroom or home to help loosen secretions.  Sleep in a semi-upright position if your cough is worse at night.  Rest as needed.  Stop smoking if you smoke. SEEK IMMEDIATE MEDICAL CARE IF:   You have pus in your sputum.  Your cough starts to worsen.  You cannot control your cough with suppressants and are losing sleep.  You begin coughing up blood.  You have difficulty breathing.  You develop pain which is getting worse or is uncontrolled with medicine.  You have a fever. MAKE SURE YOU:   Understand these instructions.  Will watch your condition.  Will get help right away if you are not doing well or get worse. Document Released: 07/19/2010 Document Revised: 04/14/2011 Document Reviewed: 07/19/2010 Exit[employer] Patient Information 2015 [employer]. This information is not intended  to replace advice given to you by your health care provider. Make sure you discuss any questions you have with your health care provider.  

## 2014-01-06 NOTE — Progress Notes (Signed)
Pre visit review using our clinic review tool, if applicable. No additional management support is needed unless otherwise documented below in the visit note. 

## 2014-01-07 NOTE — Assessment & Plan Note (Signed)
Will treat the infection with ceftin and will control the cough with hycodan

## 2014-01-07 NOTE — Progress Notes (Signed)
   Subjective:    Patient ID: Patrick Kidd, male    DOB: 25-Jul-1945, 68 y.o.   MRN: [id number]  Cough This is a new problem. The current episode started in the past 7 days. The problem has been unchanged. The cough is productive of purulent sputum. Associated symptoms include shortness of breath. Pertinent negatives include no chest pain, chills, ear congestion, ear pain, fever, headaches, heartburn, hemoptysis, myalgias, nasal congestion, postnasal drip, rash, rhinorrhea, sore throat, sweats, weight loss or wheezing. He has tried OTC cough suppressant for the symptoms. The treatment provided mild relief.      Review of Systems  Constitutional: Negative.  Negative for fever, chills, weight loss, diaphoresis and fatigue.  HENT: Negative.  Negative for ear pain, postnasal drip, rhinorrhea, sore throat and trouble swallowing.   Eyes: Negative.   Respiratory: Positive for cough and shortness of breath. Negative for hemoptysis, choking, chest tightness, wheezing and stridor.   Cardiovascular: Negative.  Negative for chest pain, palpitations and leg swelling.  Gastrointestinal: Negative.  Negative for heartburn, abdominal pain, diarrhea and constipation.  Endocrine: Negative.   Genitourinary: Negative.   Musculoskeletal: Negative.  Negative for myalgias and arthralgias.  Skin: Negative.  Negative for rash.  Allergic/Immunologic: Negative.   Neurological: Negative.  Negative for headaches.  Hematological: Negative.  Negative for adenopathy. Does not bruise/bleed easily.  Psychiatric/Behavioral: Negative.        Objective:   Physical Exam  Constitutional: He is oriented to person, place, and time. He appears well-developed and well-nourished.  Non-toxic appearance. He does not have a sickly appearance. He does not appear ill. No distress.  HENT:  Head: Normocephalic and atraumatic.  Mouth/Throat: Oropharynx is clear and moist. No oropharyngeal exudate.  Eyes: Conjunctivae are normal.  Right eye exhibits no discharge. Left eye exhibits no discharge. No scleral icterus.  Neck: Normal range of motion. Neck supple. No JVD present. No tracheal deviation present. No thyromegaly present.  Cardiovascular: Normal rate, regular rhythm, normal heart sounds and intact distal pulses.  Exam reveals no gallop and no friction rub.   No murmur heard. Pulmonary/Chest: Effort normal and breath sounds normal. No accessory muscle usage or stridor. No respiratory distress. He has no decreased breath sounds. He has no wheezes. He has no rhonchi. He has no rales.  Abdominal: Soft. Bowel sounds are normal. He exhibits no distension and no mass. There is no tenderness. There is no rebound and no guarding.  Musculoskeletal: Normal range of motion. He exhibits no edema or tenderness.  Lymphadenopathy:    He has no cervical adenopathy.  Neurological: He is oriented to person, place, and time.  Skin: Skin is warm and dry. No rash noted. He is not diaphoretic. No erythema. No pallor.  Vitals reviewed.         Assessment & Plan:

## 2014-01-07 NOTE — Assessment & Plan Note (Signed)
CXR is normal Will treat for URI

## 2014-01-09 ENCOUNTER — Telehealth: Payer: Self-pay | Admitting: Internal Medicine

## 2014-01-09 ENCOUNTER — Telehealth: Payer: Self-pay | Admitting: Pulmonary Disease

## 2014-01-09 NOTE — Telephone Encounter (Signed)
Called and spoke to pt. Pt stated that Sharyn Lull called him on 01/06/14 at 5:30p. Informed pt that no one from our office has called but Dr. Ronnald Ramp' nurse called about CXR results on 01/06/14 at 5:30p. Informed pt to call Dr. Ronnald Ramp' because they called on 01/06/14, phone number provided. Pt verbalized understanding and denied any further questions or concerns at this time.

## 2014-01-10 NOTE — Telephone Encounter (Signed)
Patient notified of results.

## 2014-01-10 NOTE — Telephone Encounter (Signed)
Pt called in said that Patrick Kidd left him a message and he was returning call.  He wanted to know the results of the xray   Cell number is the best number

## 2014-01-11 ENCOUNTER — Telehealth: Payer: Self-pay | Admitting: Internal Medicine

## 2014-01-11 MED ORDER — FUROSEMIDE 40 MG PO TABS: 40.0000 mg | ORAL_TABLET | ORAL | Status: DC | PRN

## 2014-01-11 NOTE — Telephone Encounter (Signed)
Pt called in requesting a refill on his furosemide (LASIX) 40 MG tablet [[id number]]

## 2014-01-11 NOTE — Telephone Encounter (Signed)
Notified pt rx call into pharmacy.../lmb 

## 2014-01-23 DIAGNOSIS — R350 Frequency of micturition: Secondary | ICD-10-CM | POA: Diagnosis not present

## 2014-01-23 DIAGNOSIS — N3941 Urge incontinence: Secondary | ICD-10-CM | POA: Diagnosis not present

## 2014-01-23 DIAGNOSIS — R351 Nocturia: Secondary | ICD-10-CM | POA: Diagnosis not present

## 2014-01-24 ENCOUNTER — Telehealth: Payer: Self-pay | Admitting: Internal Medicine

## 2014-01-24 ENCOUNTER — Other Ambulatory Visit: Payer: Self-pay | Admitting: Internal Medicine

## 2014-01-24 DIAGNOSIS — Z8739 Personal history of other diseases of the musculoskeletal system and connective tissue: Secondary | ICD-10-CM

## 2014-01-24 NOTE — Telephone Encounter (Signed)
No gout meds on Med List; what is he requesting? Uric acid should be rechecked; last checked 6/14

## 2014-01-24 NOTE — Telephone Encounter (Signed)
Pt called back in to see what Dr Linna Darner would like to do?

## 2014-01-24 NOTE — Telephone Encounter (Signed)
Patient requesting refill of meds for gout

## 2014-01-24 NOTE — Telephone Encounter (Signed)
Patient advised uric acid level order has been placed. He will come by in the morning to have this drawn

## 2014-01-25 ENCOUNTER — Telehealth: Payer: Self-pay | Admitting: Internal Medicine

## 2014-01-25 ENCOUNTER — Other Ambulatory Visit (INDEPENDENT_AMBULATORY_CARE_PROVIDER_SITE_OTHER): Payer: Medicare Other

## 2014-01-25 DIAGNOSIS — Z8639 Personal history of other endocrine, nutritional and metabolic disease: Secondary | ICD-10-CM | POA: Diagnosis not present

## 2014-01-25 DIAGNOSIS — Z8739 Personal history of other diseases of the musculoskeletal system and connective tissue: Secondary | ICD-10-CM

## 2014-01-25 LAB — URIC ACID: URIC ACID, SERUM: 6.5 mg/dL (ref 4.0–7.8)

## 2014-01-25 NOTE — Telephone Encounter (Signed)
Uric acid level is normal; he needs OV if symptoms fail to respond to Aleve every 8 hrs with food as needed He needs to sign up for My chart for rapid response to labs

## 2014-01-25 NOTE — Telephone Encounter (Signed)
Pt had lab this am, states he was told he would have the results in a few hrs., Requesting results.

## 2014-01-25 NOTE — Telephone Encounter (Signed)
Pt called back and I gave him Dr Darrol Angel response.

## 2014-02-09 ENCOUNTER — Telehealth: Payer: Self-pay | Admitting: Internal Medicine

## 2014-02-09 DIAGNOSIS — M25571 Pain in right ankle and joints of right foot: Secondary | ICD-10-CM | POA: Diagnosis not present

## 2014-02-09 NOTE — Telephone Encounter (Signed)
Notified pt with md response made appt for 02/17/14...Patrick Kidd

## 2014-02-09 NOTE — Telephone Encounter (Signed)
Patient stating that he has having issues with ligament in his heel. Asking to be referred to a specialist.

## 2014-02-09 NOTE — Telephone Encounter (Signed)
I recommend you see Dr Charlann Boxer, Sports Medicine specialist., phone # [phone].

## 2014-02-17 ENCOUNTER — Ambulatory Visit (INDEPENDENT_AMBULATORY_CARE_PROVIDER_SITE_OTHER): Payer: Medicare Other | Admitting: Family Medicine

## 2014-02-17 ENCOUNTER — Other Ambulatory Visit (INDEPENDENT_AMBULATORY_CARE_PROVIDER_SITE_OTHER): Payer: Medicare Other

## 2014-02-17 ENCOUNTER — Encounter: Payer: Self-pay | Admitting: Family Medicine

## 2014-02-17 VITALS — BP 146/92 | HR 89 | Ht 74.0 in | Wt 273.0 lb

## 2014-02-17 DIAGNOSIS — M79671 Pain in right foot: Secondary | ICD-10-CM | POA: Diagnosis not present

## 2014-02-17 DIAGNOSIS — M722 Plantar fascial fibromatosis: Secondary | ICD-10-CM | POA: Diagnosis not present

## 2014-02-17 MED ORDER — MELOXICAM 15 MG PO TABS: 15.0000 mg | ORAL_TABLET | Freq: Every day | ORAL | Status: DC

## 2014-02-17 NOTE — Patient Instructions (Addendum)
Good to see you Ice bath 10-15 minutes at night.  Meloxicam daily for 10 days then as needed Exercises 3 times a week.  Rigid sole shoes keen, merrell, or new balance greater then 700.  [employer]co orthotics look for "total support" See me again in 3-4 weeks.

## 2014-02-17 NOTE — Progress Notes (Signed)
Pre visit review using our clinic review tool, if applicable. No additional management support is needed unless otherwise documented below in the visit note. 

## 2014-02-17 NOTE — Progress Notes (Signed)
[facility] [address] [place] [place], [place] [address] Phone: [phone] Subjective:    I'm seeing this patient by the request  of:  Patrick Cobble, MD   CC: right heel pain.   [name] is a 69 y.o. male coming in with complaint of heel pain. Patient states that he is had this pain for quite some time. Patient was seen by another provider and was diagnosed with a heel spur. In addition to this he was recently given an injection that only worked for approximately 3 days for plantar fasciitis. Patient has had this pain before and has even gone through formal physical therapy. Patient states though that this one is fairly concerning because it does not seem to be getting any better whatsoever. Patient describes it as a dull throbbing aching pain. Seems to be better in certain shoes. Worse with the first at the morning. Rates the severity of pain a 7 out of 10.     Past medical history, social, surgical and family history all reviewed in electronic medical record.   Review of Systems: No headache, visual changes, nausea, vomiting, diarrhea, constipation, dizziness, abdominal pain, skin rash, fevers, chills, night sweats, weight loss, swollen lymph nodes, body aches, joint swelling, muscle aches, chest pain, shortness of breath, mood changes.   Objective Blood pressure 146/92, pulse 89, height 6\' 2"  (1.88 m), weight 273 lb (123.832 kg), SpO2 94 %.  General: No apparent distress alert and oriented x3 mood and affect normal, dressed appropriately.  HEENT: Pupils equal, extraocular movements intact  Respiratory: Patient's speak in full sentences and does not appear short of breath  Cardiovascular: +1 lower extremity edema, non tender, no erythema  Skin: Warm dry intact with no signs of infection or rash on extremities or on axial skeleton.  Abdomen: Soft nontender  Neuro: Cranial nerves II through XII are intact, neurovascularly intact in all  extremities with 2+ DTRs and 2+ pulses.  Lymph: No lymphadenopathy of posterior or anterior cervical chain or axillae bilaterally.  Gait normal with good balance and coordination.  MSK:  Non tender with full range of motion and good stability and symmetric strength and tone of shoulders, elbows, wrist, hip, knee and ankles bilaterally.   Foot exam shows the patient does have breakdown of the transverse and longitudinal arch bilaterally with overpronation of the hindfoot. Patient is severely tender over the medial calcaneal region as well as on the plantar aspect of the heel. No erythema, swelling or bruising noted. Patient does have good range of motion of the ankle itself. Patient also has pitting edema to the mid calf bilaterally   MSK US performed of: Right foot This study was ordered, performed, and interpreted by Charlann Boxer D.O.  Foot/Ankle:   All structures visualized.   Talar dome unremarkable  Ankle mortise without effusion. Peroneus longus and brevis tendons unremarkable on long and transverse views without sheath effusions. Posterior tibialis, flexor hallucis longus, and flexor digitorum longus tendons unremarkable on long and transverse views without sheath effusions. Achilles tendon visualized along length of tendon and unremarkable on long and transverse views without sheath effusion. Anterior Talofibular Ligament and Calcaneofibular Ligaments unremarkable and intact. Deltoid Ligament unremarkable and intact. Plantar fascia intact with significant hypoechoic changes and this is larger 1.19 cm. Posterior plantar heel spur noted. Power doppler signal normal.  IMPRESSION: Plantar fasciitis  Procedure note 29528; 15 minutes spent for Therapeutic exercises as stated in above notes.  This included exercises focusing on  stretching, strengthening, with significant focus on eccentric aspects.   Proper technique shown and discussed handout in great detail with ATC.  All questions were  discussed and answered.      Impression and Recommendations:     This case required medical decision making of moderate complexity.

## 2014-02-17 NOTE — Assessment & Plan Note (Signed)
Discussed with patient again at great length. We discussed that weight could be beneficial. We discussed proper shoe choices as well as an icing protocol. Patients can try some topical anti-inflammatory was given some oral anti-laboratory for short course. Patient's comorbidities including obesity as well as peripheral neuropathy is also likely contributing. We discussed over-the-counter orthotics. We discussed the possibility of custom orthotics in the future. Patient did work with Product/process development scientist to learn home exercises in greater detail. Patient and will come back and see me again in 3-4 weeks for further evaluation and treatment. If continuing to have pain we will need either consider formal physical therapy or custom orthotics.

## 2014-03-03 ENCOUNTER — Emergency Department (HOSPITAL_COMMUNITY): Payer: Medicare Other

## 2014-03-03 ENCOUNTER — Encounter (HOSPITAL_COMMUNITY): Payer: Self-pay | Admitting: *Deleted

## 2014-03-03 ENCOUNTER — Emergency Department (HOSPITAL_COMMUNITY)
Admission: EM | Admit: 2014-03-03 | Discharge: 2014-03-04 | Disposition: A | Discharge status: Home / Self Care | Payer: Medicare Other | Attending: Emergency Medicine | Admitting: Emergency Medicine

## 2014-03-03 DIAGNOSIS — R079 Chest pain, unspecified: Secondary | ICD-10-CM | POA: Insufficient documentation

## 2014-03-03 DIAGNOSIS — Z87438 Personal history of other diseases of male genital organs: Secondary | ICD-10-CM | POA: Diagnosis not present

## 2014-03-03 DIAGNOSIS — Z79899 Other long term (current) drug therapy: Secondary | ICD-10-CM | POA: Diagnosis not present

## 2014-03-03 DIAGNOSIS — G629 Polyneuropathy, unspecified: Secondary | ICD-10-CM | POA: Insufficient documentation

## 2014-03-03 DIAGNOSIS — Z791 Long term (current) use of non-steroidal anti-inflammatories (NSAID): Secondary | ICD-10-CM | POA: Insufficient documentation

## 2014-03-03 DIAGNOSIS — Z8739 Personal history of other diseases of the musculoskeletal system and connective tissue: Secondary | ICD-10-CM | POA: Diagnosis not present

## 2014-03-03 DIAGNOSIS — R0602 Shortness of breath: Secondary | ICD-10-CM | POA: Diagnosis not present

## 2014-03-03 DIAGNOSIS — K219 Gastro-esophageal reflux disease without esophagitis: Secondary | ICD-10-CM | POA: Diagnosis not present

## 2014-03-03 DIAGNOSIS — K760 Fatty (change of) liver, not elsewhere classified: Secondary | ICD-10-CM | POA: Diagnosis not present

## 2014-03-03 DIAGNOSIS — Z86010 Personal history of colonic polyps: Secondary | ICD-10-CM | POA: Diagnosis not present

## 2014-03-03 DIAGNOSIS — R197 Diarrhea, unspecified: Secondary | ICD-10-CM | POA: Diagnosis not present

## 2014-03-03 DIAGNOSIS — R1013 Epigastric pain: Secondary | ICD-10-CM | POA: Diagnosis not present

## 2014-03-03 DIAGNOSIS — R109 Unspecified abdominal pain: Secondary | ICD-10-CM

## 2014-03-03 DIAGNOSIS — Z87891 Personal history of nicotine dependence: Secondary | ICD-10-CM | POA: Insufficient documentation

## 2014-03-03 DIAGNOSIS — K7689 Other specified diseases of liver: Secondary | ICD-10-CM | POA: Diagnosis not present

## 2014-03-03 DIAGNOSIS — R1011 Right upper quadrant pain: Secondary | ICD-10-CM | POA: Diagnosis not present

## 2014-03-03 LAB — HEPATIC FUNCTION PANEL
ALT: 26 U/L (ref 0–53)
AST: 55 U/L — ABNORMAL HIGH (ref 0–37)
Albumin: 3.8 g/dL (ref 3.5–5.2)
Alkaline Phosphatase: 81 U/L (ref 39–117)
BILIRUBIN DIRECT: 0.6 mg/dL — AB (ref 0.0–0.5)
BILIRUBIN INDIRECT: 1.3 mg/dL — AB (ref 0.3–0.9)
BILIRUBIN TOTAL: 1.9 mg/dL — AB (ref 0.3–1.2)
Total Protein: 7.4 g/dL (ref 6.0–8.3)

## 2014-03-03 LAB — CBC
HEMATOCRIT: 43.2 % (ref 39.0–52.0)
HEMOGLOBIN: 15.5 g/dL (ref 13.0–17.0)
MCH: 36.5 pg — ABNORMAL HIGH (ref 26.0–34.0)
MCHC: 35.9 g/dL (ref 30.0–36.0)
MCV: 101.6 fL — ABNORMAL HIGH (ref 78.0–100.0)
Platelets: 62 10*3/uL — ABNORMAL LOW (ref 150–400)
RBC: 4.25 MIL/uL (ref 4.22–5.81)
RDW: 13.1 % (ref 11.5–15.5)
WBC: 5.7 10*3/uL (ref 4.0–10.5)

## 2014-03-03 LAB — BASIC METABOLIC PANEL
ANION GAP: 12 (ref 5–15)
BUN: 12 mg/dL (ref 6–23)
CO2: 23 mmol/L (ref 19–32)
Calcium: 9 mg/dL (ref 8.4–10.5)
Chloride: 101 mmol/L (ref 96–112)
Creatinine, Ser: 0.67 mg/dL (ref 0.50–1.35)
GFR calc non Af Amer: >90 mL/min (ref >90)
Glucose, Bld: 139 mg/dL — ABNORMAL HIGH (ref 70–99)
POTASSIUM: 4 mmol/L (ref 3.5–5.1)
Sodium: 136 mmol/L (ref 135–145)

## 2014-03-03 LAB — I-STAT TROPONIN, ED: Troponin i, poc: 0.01 ng/mL (ref 0.00–0.08)

## 2014-03-03 LAB — BRAIN NATRIURETIC PEPTIDE: B NATRIURETIC PEPTIDE 5: 131.5 pg/mL — AB (ref 0.0–100.0)

## 2014-03-03 LAB — LIPASE, BLOOD: Lipase: 27 U/L (ref 11–59)

## 2014-03-03 MED ORDER — LORAZEPAM 2 MG/ML IJ SOLN
1.0000 mg | Freq: Once | INTRAMUSCULAR | Status: DC
  Filled 2014-03-03: qty 1

## 2014-03-03 MED ORDER — GI COCKTAIL ~~LOC~~
30.0000 mL | Freq: Once | ORAL | Status: AC
  Administered 2014-03-03: 30 mL via ORAL
  Filled 2014-03-03: qty 30

## 2014-03-03 MED ORDER — SIMETHICONE 80 MG PO CHEW
160.0000 mg | CHEWABLE_TABLET | Freq: Once | ORAL | Status: AC
  Administered 2014-03-03: 160 mg via ORAL
  Filled 2014-03-03: qty 2

## 2014-03-03 NOTE — ED Notes (Signed)
Pt reports sob, abdominal distention and substernal chest pain x3 days. Denies n/v, diaphoresis, radiation of pain.

## 2014-03-03 NOTE — ED Provider Notes (Addendum)
CSN: [id number]     Arrival date & time 03/03/14  1751 History   First MD Initiated Contact with Patient 03/03/14 1947     Chief Complaint  Patient presents with  . Shortness of Breath  . Bloated     (Consider location/radiation/quality/duration/timing/severity/associated sxs/prior Treatment) HPI Patrick Kidd is a 69 y.o. male with history of gastroesophageal reflux disease, peripheral neuropathy, the reticular doses, presents to emergency department complaining of shortness of breath, abdominal distention, bloating, abdominal pain. Patient states symptoms began 2 days ago after eating pizza. He states he felt like "I ate too much, and I became short of breath." He states he feels like he has to take a deep breath to catch his breath. Patient states the breathing helps his symptoms. He states nothing makes them worse. He denies exertional symptoms. He states pain is in the epigastric area radiating to his chest. He also has diffuse abdominal bloating and gurgling of the stomach. He reports increased frequency of bowel movements, states they're loose. He denies any nausea or vomiting. He denies any problems eating. He denies any history of the same. He tried to take tums yesterday which seemed to help. He denies any exertional chest pain or shortness of breath. No history of cardiac problems. No history of abdominal problems other than diverticulosis and colonic polyps.  Past Medical History  Diagnosis Date  . Gout   . Chronic cough     PMH of  . GERD (gastroesophageal reflux disease)   . Peripheral neuropathy   . Diverticulosis 07-03-2010    Colonoscopy.   Marland Kitchen Hx of adenomatous colonic polyps 07-03-2010    Colonoscopy. F/U due 2017  . Plantar fasciitis     PMH of  . Hydrocele   . Acrophobia   . Nonspecific elevation of levels of transaminase or lactic acid dehydrogenase (LDH)    Past Surgical History  Procedure Laterality Date  . Wisdom tooth extraction    . Flexible sigmoidoscopy   2000  . Colonoscopy w/ polypectomy  07/03/2010    2 adenomas, diverticulosis on right. Dr [name]  . Cataract extraction, bilateral  12/2011     Dr Gershon Crane   Family History  Problem Relation Age of Onset  . Lung cancer Mother     smoker  . Leukemia Father     Acute myelocytic  . Diabetes Neg Hx   . Stroke Neg Hx   . Heart disease Neg Hx    History  Substance Use Topics  . Smoking status: Former Smoker -- 2.00 packs/day for 6 years    Types: Cigarettes    Quit date: 02/04/1971  . Smokeless tobacco: Never Used     Comment: smoked age [age], up to 2 ppd  . Alcohol Use: 12.6 oz/week    21 Shots of liquor per week     Comment: 2-3 a day    Review of Systems  Constitutional: Negative for fever and chills.  Respiratory: Positive for chest tightness and shortness of breath. Negative for cough.   Cardiovascular: Negative for chest pain, palpitations and leg swelling.  Gastrointestinal: Positive for abdominal pain, diarrhea and abdominal distention. Negative for nausea and vomiting.  Genitourinary: Negative for dysuria, urgency, frequency and hematuria.  Musculoskeletal: Negative for myalgias, arthralgias, neck pain and neck stiffness.  Skin: Negative for rash.  Allergic/Immunologic: Negative for immunocompromised state.  Neurological: Negative for dizziness, weakness, light-headedness, numbness and headaches.  All other systems reviewed and are negative.     Allergies  Review of  patient's allergies indicates no known allergies.  Home Medications   Prior to Admission medications   Medication Sig Start Date End Date Taking? Authorizing Provider  cefUROXime (CEFTIN) 500 MG tablet Take 1 tablet (500 mg total) by mouth 2 (two) times daily. 01/06/14   [name], MD  furosemide (LASIX) 40 MG tablet Take 1 tablet (40 mg total) by mouth as needed. 01/11/14   Hendricks Limes, MD  gabapentin (NEURONTIN) 300 MG capsule Take 1 capsule (300 mg total) by mouth 3 (three) times daily.  11/08/13   Adam Melvern Sample, DO  HYDROcodone-homatropine [facility]) 5-1.5 MG/5ML syrup Take 5 mLs by mouth every 8 (eight) hours as needed for cough. 01/06/14   [name], MD  meloxicam (MOBIC) 15 MG tablet Take 1 tablet (15 mg total) by mouth daily. 02/17/14   Lyndal Pulley, DO  Multiple Vitamin (MULTIVITAMIN) tablet Take 1 tablet by mouth daily.    Historical Provider, MD  sildenafil (REVATIO) 20 MG tablet 3 qd prn 07/28/13   Hendricks Limes, MD  sildenafil (VIAGRA) 100 MG tablet Take 1 tablet (100 mg total) by mouth daily as needed. 1/2-1 by mouth as needed 06/05/11   Hendricks Limes, MD   BP 158/73 mmHg  Pulse 103  Temp(Src) 98.4 F (36.9 C) (Oral)  Resp 17  SpO2 93% Physical Exam  Constitutional: He appears well-developed and well-nourished. No distress.  HENT:  Head: Normocephalic and atraumatic.  Eyes: Conjunctivae are normal.  Neck: Neck supple.  Cardiovascular: Normal rate, regular rhythm and normal heart sounds.   Pulmonary/Chest: Effort normal and breath sounds normal. No respiratory distress. He has no wheezes. He has no rales.  Abdominal: Soft. He exhibits no distension. Bowel sounds are increased. There is tenderness. There is no rebound and no guarding.  Epigastric and RUQ tenderness  Musculoskeletal: He exhibits no edema.  Neurological: He is alert.  Skin: Skin is warm and dry.  Nursing note and vitals reviewed.   ED Course  Procedures (including critical care time) Labs Review Labs Reviewed  CBC - Abnormal; Notable for the following:    MCV 101.6 (*)    MCH 36.5 (*)    Platelets 62 (*)    All other components within normal limits  BASIC METABOLIC PANEL - Abnormal; Notable for the following:    Glucose, Bld 139 (*)    All other components within normal limits  BRAIN NATRIURETIC PEPTIDE - Abnormal; Notable for the following:    B Natriuretic Peptide 131.5 (*)    All other components within normal limits  HEPATIC FUNCTION PANEL - Abnormal; Notable for the  following:    AST 55 (*)    Total Bilirubin 1.9 (*)    Bilirubin, Direct 0.6 (*)    Indirect Bilirubin 1.3 (*)    All other components within normal limits  LIPASE, BLOOD  I-STAT TROPOININ, ED    Imaging Review Dg Chest 2 View  03/03/2014   CLINICAL DATA:  SOB and mid chest pain since Wednesday. Pt also complains of diarrhea and bloating. Ex smoker  EXAM: CHEST  2 VIEW  COMPARISON:  01/06/2014  FINDINGS: Cardiac silhouette normal in size and configuration. No mediastinal or hilar masses or evidence of adenopathy.  Lungs are clear.  No pleural effusion or pneumothorax.  Bony thorax is intact  IMPRESSION: No active cardiopulmonary disease.   Electronically Signed   By: Lajean Manes M.D.   On: 03/03/2014 20:41   Dg Abd 2 Views  03/03/2014  CLINICAL DATA:  SOB and mid chest pain since Wednesday. Pt also complains of diarrhea and bloating. Ex smoker  EXAM: ABDOMEN - 2 VIEW  COMPARISON:  None.  FINDINGS: There is no bowel distention to suggest obstruction or generalized adynamic ileus. No air-fluid levels are evident on the upright view.  No free air.  Soft tissues are not well defined but grossly unremarkable. No renal or ureteral stones are evident.  IMPRESSION: No acute findings. No evidence of bowel obstruction, generalized adynamic ileus or free air.   Electronically Signed   By: Lajean Manes M.D.   On: 03/03/2014 20:42     EKG Interpretation   Date/Time:  Friday March 03 2014 18:15:02 EST Ventricular Rate:  90 PR Interval:  125 QRS Duration: 86 QT Interval:  377 QTC Calculation: 461 R Axis:   50 Text Interpretation:  Sinus tachycardia Ventricular premature complex  Baseline wander in lead(s) V2 V3 Confirmed by Zenia Resides  MD, ANTHONY ([id number])  on 03/03/2014 9:05:28 PM      MDM   Final diagnoses:  Abdominal pain  Chest pain  Gastroesophageal reflux disease without esophagitis    Patient is here with abdominal bloating, hyperactive bowel sounds, epigastric pain, states feels  some shortness of breath, but states it's more of a feeling of fullness in the abdomen that is restricting his breathing. Will order labs, abdominal and chest x-rays, EKG. Vital signs show hypertension, otherwise normal. Abdomen is diffusely tender.   X-rays and labs unremarkable. Chronically mildly elevated bilirubin and AST. Will get ultrasound abdomen, given pain after eating, epigastric, abdominal distention, nausea.   Ultrasound is negative. I discussed patient with Dr. Zenia Resides who has seen him and ordered CT angiogram since patient was describing some shortness of breath, and he has had some recent travel. Patient is also not tachycardic. Patient also admits to heavy drinking daily. Question mild withdrawal at this time given patient's written emergency department for over 6 hours. Dr. Zenia Resides ordered him an Ativan.  1:00 AM Patient's CT showing diffuse fatty infiltration of the liver and large cyst in right lobe. Discussed this with patient. Advised him to stop drinking, fixing his diet. He'll need to follow-up with his primary care doctor for further evaluation. Patient refused Ativan that was ordered earlier. Will discharge home with Pepcid, Carafate, Protonix. Advised to call and follow with his doctor. Return if his symptoms are worsening. At this time patient is feeling better after GI cocktail and Gas-X, abdomen is benign.  Filed Vitals:   03/03/14 2330 03/04/14 0000 03/04/14 0029 03/04/14 0101  BP: 158/84 164/76  184/79  Pulse: 99 104 102 86  Temp:    98.8 F (37.1 C)  TempSrc:    Oral  Resp: 18 17 20 20   SpO2: 93% 93% 94% 100%     Renold Genta, PA-C 03/04/14 0113  Leota Jacobsen, MD 03/05/14 1716  Renold Genta, PA-C 03/10/14 1884  Leota Jacobsen, MD 03/13/14 [phone]

## 2014-03-03 NOTE — ED Notes (Signed)
US at bedside

## 2014-03-03 NOTE — ED Provider Notes (Signed)
Medical screening examination/treatment/procedure(s) were conducted as a shared visit with non-physician practitioner(s) and myself.  I personally evaluated the patient during the encounter.   EKG Interpretation   Date/Time:  Friday March 03 2014 18:15:02 EST Ventricular Rate:  90 PR Interval:  125 QRS Duration: 86 QT Interval:  377 QTC Calculation: 461 R Axis:   50 Text Interpretation:  Sinus tachycardia Ventricular premature complex  Baseline wander in lead(s) V2 V3 Confirmed by Karliah Kowalchuk  MD, Benard Minturn ([id number])  on 03/03/2014 9:05:28 PM     Patient here complaining of dyspnea along with abdominal distention. Denies any dyspnea on exertion. He has had a cough recently. Patient states that when resting he has to catch his breath. Patient travels by car extensively for long periods of time. Denies any leg pain or swelling. Patient missed to copious amounts of alcohol use daily and last drink was yesterday. Denies any tremors. Work appeared noted a negative for gallbladder pathology. Will obtain chest CT and give Ativan.  Leota Jacobsen, MD 03/03/14 [phone]

## 2014-03-03 NOTE — ED Notes (Signed)
Patient back from x-ray Patient remains in NAD 

## 2014-03-03 NOTE — ED Notes (Signed)
Per Aniceto Boss in the lab, Hepatic function panel and Lipase can be added on to blood already drawn

## 2014-03-03 NOTE — ED Notes (Signed)
Patient transported to X-ray Patient alert and oriented x 4 and in NAD upon leaving ED for testing

## 2014-03-04 DIAGNOSIS — K7689 Other specified diseases of liver: Secondary | ICD-10-CM | POA: Diagnosis not present

## 2014-03-04 DIAGNOSIS — R079 Chest pain, unspecified: Secondary | ICD-10-CM | POA: Diagnosis not present

## 2014-03-04 LAB — TROPONIN I

## 2014-03-04 MED ORDER — OMEPRAZOLE 20 MG PO CPDR: 20.0000 mg | DELAYED_RELEASE_CAPSULE | Freq: Every day | ORAL | Status: DC

## 2014-03-04 MED ORDER — SUCRALFATE 1 G PO TABS: 1.0000 g | ORAL_TABLET | Freq: Three times a day (TID) | ORAL | Status: DC

## 2014-03-04 MED ORDER — SIMETHICONE 80 MG PO CHEW: 80.0000 mg | CHEWABLE_TABLET | Freq: Four times a day (QID) | ORAL | Status: DC | PRN

## 2014-03-04 MED ORDER — IOHEXOL 350 MG/ML SOLN
100.0000 mL | Freq: Once | INTRAVENOUS | Status: AC | PRN
  Administered 2014-03-04: 100 mL via INTRAVENOUS

## 2014-03-04 MED ORDER — FAMOTIDINE 20 MG PO TABS: 20.0000 mg | ORAL_TABLET | Freq: Two times a day (BID) | ORAL | Status: DC

## 2014-03-04 NOTE — ED Notes (Signed)
Discharge instructions reviewed with patient--agrees and verbalized understanding Patient informed of need to make and keep follow up appointment--agrees and verbalized understanding DC Rx reviewed with patient--agrees and verbalized understanding VS updated and stable--reviewed with patient at time of DC--agrees and verbalized understanding Patient alert and oriented x 4 and in NAD at time of discharge All questions related to this ED visit, DC instructions, DC prescriptions and follow up care answered to patient's satisfaction by this nurse 

## 2014-03-04 NOTE — ED Notes (Addendum)
PIV started and labs drawn CT made aware that PIV site in place and patient is next on list Patient and patient's wife updated Patient declined Ativan

## 2014-03-04 NOTE — Discharge Instructions (Signed)
Take moperazole and pepcid daily. Carafate with meals and at bed time as prescribed. Gas-x for abdominal distention as needed. Your CT showed fatty infiltration of the liver and a cyst. Please follow up with your doctor regarding this finding. Stop drinking.   Fatty Liver Fatty liver is the accumulation of fat in liver cells. It is also called hepatosteatosis or steatohepatitis. It is normal for your liver to contain some fat. If fat is more than 5 to 10% of your liver's weight, you have fatty liver.  There are often no symptoms (problems) for years while damage is still occurring. People often learn about their fatty liver when they have medical tests for other reasons. Fat can damage your liver for years or even decades without causing problems. When it becomes severe, it can cause fatigue, weight loss, weakness, and confusion. This makes you more likely to develop more serious liver problems. The liver is the largest organ in the body. It does a lot of work and often gives no warning signs when it is sick until late in a disease. The liver has many important jobs including:  Breaking down foods.  Storing vitamins, iron, and other minerals.  Making proteins.  Making bile for food digestion.  Breaking down many products including medications, alcohol and some poisons. CAUSES  There are a number of different conditions, medications, and poisons that can cause a fatty liver. Eating too many calories causes fat to build up in the liver. Not processing and breaking fats down normally may also cause this. Certain conditions, such as obesity, diabetes, and high triglycerides also cause this. Most fatty liver patients tend to be middle-aged and over weight.  Some causes of fatty liver are:  Alcohol over consumption.  Malnutrition.  Steroid use.  Valproic acid toxicity.  Obesity.  Cushing's syndrome.  Poisons.  Tetracycline in high  dosages.  Pregnancy.  Diabetes.  Hyperlipidemia.  Rapid weight loss. Some people develop fatty liver even having none of these conditions. SYMPTOMS  Fatty liver most often causes no problems. This is called asymptomatic.  It can be diagnosed with blood tests and also by a liver biopsy.  It is one of the most common causes of minor elevations of liver enzymes on routine blood tests.  Specialized Imaging of the liver using ultrasound, CT (computed tomography) scan, or MRI (magnetic resonance imaging) can suggest a fatty liver but a biopsy is needed to confirm it.  A biopsy involves taking a small sample of liver tissue. This is done by using a needle. It is then looked at under a microscope by a specialist. TREATMENT  It is important to treat the cause. Simple fatty liver without a medical reason may not need treatment.  Weight loss, fat restriction, and exercise in overweight patients produces inconsistent results but is worth trying.  Fatty liver due to alcohol toxicity may not improve even with stopping drinking.  Good control of diabetes may reduce fatty liver.  L   Gastroesophageal Reflux Disease, Adult Gastroesophageal reflux disease (GERD) happens when acid from your stomach flows up into the esophagus. When acid comes in contact with the esophagus, the acid causes soreness (inflammation) in the esophagus. Over time, GERD may create small holes (ulcers) in the lining of the esophagus. CAUSES  Increased body weight. This puts pressure on the stomach, making acid rise from the stomach into the esophagus. Smoking. This increases acid production in the stomach. Drinking alcohol. This causes decreased pressure in the lower esophageal sphincter (valve or  ring of muscle between the esophagus and stomach), allowing acid from the stomach into the esophagus. Late evening meals and a full stomach. This increases pressure and acid production in the stomach. A malformed lower  esophageal sphincter. Sometimes, no cause is found. SYMPTOMS  Burning pain in the lower part of the mid-chest behind the breastbone and in the mid-stomach area. This may occur twice a week or more often. Trouble swallowing. Sore throat. Dry cough. Asthma-like symptoms including chest tightness, shortness of breath, or wheezing. DIAGNOSIS  Your caregiver may be able to diagnose GERD based on your symptoms. In some cases, X-rays and other tests may be done to check for complications or to check the condition of your stomach and esophagus. TREATMENT  Your caregiver may recommend over-the-counter or prescription medicines to help decrease acid production. Ask your caregiver before starting or adding any new medicines.  HOME CARE INSTRUCTIONS  Change the factors that you can control. Ask your caregiver for guidance concerning weight loss, quitting smoking, and alcohol consumption. Avoid foods and drinks that make your symptoms worse, such as: Caffeine or alcoholic drinks. Chocolate. Peppermint or mint flavorings. Garlic and onions. Spicy foods. Citrus fruits, such as oranges, lemons, or limes. Tomato-based foods such as sauce, chili, salsa, and pizza. Fried and fatty foods. Avoid lying down for the 3 hours prior to your bedtime or prior to taking a nap. Eat small, frequent meals instead of large meals. Wear loose-fitting clothing. Do not wear anything tight around your waist that causes pressure on your stomach. Raise the head of your bed 6 to 8 inches with wood blocks to help you sleep. Extra pillows will not help. Only take over-the-counter or prescription medicines for pain, discomfort, or fever as directed by your caregiver. Do not take aspirin, ibuprofen, or other nonsteroidal anti-inflammatory drugs (NSAIDs). SEEK IMMEDIATE MEDICAL CARE IF:  You have pain in your arms, neck, jaw, teeth, or back. Your pain increases or changes in intensity or duration. You develop nausea, vomiting, or  sweating (diaphoresis). You develop shortness of breath, or you faint. Your vomit is green, yellow, black, or looks like coffee grounds or blood. Your stool is red, bloody, or black. These symptoms could be signs of other problems, such as heart disease, gastric bleeding, or esophageal bleeding. MAKE SURE YOU:  Understand these instructions. Will watch your condition. Will get help right away if you are not doing well or get worse. Document Released: 10/30/2004 Document Revised: 04/14/2011 Document Reviewed: 08/09/2010 [facility] Patient Information 2015 [place], [place]. This information is not intended to replace advice given to you by your health care provider. Make sure you discuss any questions you have with your health care provider.  ower your triglycerides through diet, medication or both.  Eat a balanced, healthy diet.  Increase your physical activity.  Get regular checkups from a liver specialist.  There are no medical or surgical treatments for a fatty liver or NASH, but improving your diet and increasing your exercise may help prevent or reverse some of the damage. PROGNOSIS  Fatty liver may cause no damage or it can lead to an inflammation of the liver. This is, called steatohepatitis. When it is linked to alcohol abuse, it is called alcoholic steatohepatitis. It often is not linked to alcohol. It is then called nonalcoholic steatohepatitis, or NASH. Over time the liver may become scarred and hardened. This condition is called cirrhosis. Cirrhosis is serious and may lead to liver failure or cancer. NASH is one of the leading causes  of cirrhosis. About 10-20% of Americans have fatty liver and a smaller 2-5% has NASH. Document Released: 03/07/2005 Document Revised: 04/14/2011 Document Reviewed: 06/01/2013 [facility] Patient Information 2015 [place], [place]. This information is not intended to replace advice given to you by your health care provider. Make sure you discuss any questions  you have with your health care provider.

## 2014-03-04 NOTE — ED Notes (Signed)
EDP aware of VS--OK to DC home

## 2014-03-04 NOTE — ED Notes (Signed)
Patient back from CT Patient remains in NAD

## 2014-03-04 NOTE — ED Notes (Signed)
Patient transported to CT Patient in NAD upon leaving room for testing

## 2014-03-04 NOTE — ED Notes (Signed)
Patient refusing wheelchair at time of DC, states that he wants to walk EDP made aware

## 2014-03-06 ENCOUNTER — Telehealth: Payer: Self-pay | Admitting: Internal Medicine

## 2014-03-06 NOTE — Telephone Encounter (Signed)
Pt request referral GI doctor. Pt experiencing acid reflux and it is blocking his esophagus. Please advise, pt need to be seen by GI doctor ASAP, he spent 8 hrs in the ER Friday.

## 2014-03-06 NOTE — Telephone Encounter (Signed)
I reviewed ER notes; unfortunately I need to see him to get GI consult. Please make appt @ 8 am tomorrow . Avoid triggers for reflux  including the "aspirin family" ; alcohol; peppermint; and caffeine (coffee, tea, cola, and chocolate). The aspirin family would include aspirin and the nonsteroidal agents such as ibuprofen &  Naproxen. Tylenol would not cause reflux. If having symptoms ; food & drink should be avoided for @ least 2 hours before going to bed. Take Omeprazole 20 mg 30 minutes before b'fast & evening meal

## 2014-03-06 NOTE — Telephone Encounter (Signed)
Patient has been advised of Dr Hopper's response.   Please schedule patient for 8 am appt tomorrow. He is already aware to come at 8 am tomorrow

## 2014-03-06 NOTE — Telephone Encounter (Signed)
appt set

## 2014-03-07 ENCOUNTER — Ambulatory Visit (INDEPENDENT_AMBULATORY_CARE_PROVIDER_SITE_OTHER): Payer: Medicare Other | Admitting: Internal Medicine

## 2014-03-07 ENCOUNTER — Other Ambulatory Visit (INDEPENDENT_AMBULATORY_CARE_PROVIDER_SITE_OTHER): Payer: Medicare Other

## 2014-03-07 VITALS — BP 154/84 | HR 76 | Temp 98.1°F | Ht 74.0 in | Wt 267.0 lb

## 2014-03-07 DIAGNOSIS — K2100 Gastro-esophageal reflux disease with esophagitis, without bleeding: Secondary | ICD-10-CM

## 2014-03-07 DIAGNOSIS — R5383 Other fatigue: Secondary | ICD-10-CM

## 2014-03-07 DIAGNOSIS — R0600 Dyspnea, unspecified: Secondary | ICD-10-CM | POA: Insufficient documentation

## 2014-03-07 DIAGNOSIS — N3941 Urge incontinence: Secondary | ICD-10-CM | POA: Diagnosis not present

## 2014-03-07 DIAGNOSIS — R350 Frequency of micturition: Secondary | ICD-10-CM | POA: Diagnosis not present

## 2014-03-07 DIAGNOSIS — R609 Edema, unspecified: Secondary | ICD-10-CM

## 2014-03-07 LAB — TSH: TSH: 5.45 u[IU]/mL — ABNORMAL HIGH (ref 0.35–4.50)

## 2014-03-07 NOTE — Progress Notes (Signed)
   Subjective:    Patient ID: Patrick Kidd, male    DOB: Jun 03, 1945, 69 y.o.   MRN: [id number]  HPI He was seen in emergency room 03/03/13 for symptoms which began 03/01/14 as gas and bloating after eating pizza. He had associated shortness of breath which was improved with rest. He noted abdominal distention. He also had discomfort up to level four which radiated up into the chest.  X-rays and labs on the emergency room were reviewed. AST 55; total bilirubin 1.9; indirect bilirubin 1.3; glucose 139; platelet count 62,000. Despite the low platelet counts ,he denies any bleeding dyscrasias.  He describes loose frequent bowel movements. He denies any antibiotic intake in the last 6-12 weeks  He had tried Gas-X at home with minimal response  He had been drinking four-5 drinks per night but discontinued this with the symptoms. With this he's lost 12 pounds. He has occasional coffee but no other triggers for  reflux  He has had exertional dyspnea since 1/27 after walking 10 feet. It is better sitting or supine. He's had no associated exertional chest pain. His major symptom is lack of stamina and daytime tiredness.  His wife is unsure whether he snores as "I can't hear it because of my snoring". She also has not observed any apnea. He has not had a sleep apnea evaluation.     Review of Systems Unexplained weight loss, abdominal pain, significant dyspepsia, dysphagia, melena, rectal bleeding, or persistently small caliber stools are denied. Epistaxis, hemoptysis, hematuria, melena, or rectal bleeding denied. There is no abnormal bruising , bleeding, or difficulty stopping bleeding with injury.     Objective:   Physical Exam  Pertinent positive findings include:  As per CDC Guidelines ,Epic documents obesity as being present . There is marked erythema and edema of the uvula. Abdomen is massive. He has 1+ pitting edema of the lower extremities.  General appearance :adequately  nourished; in no distress. Eyes: No conjunctival inflammation or scleral icterus is present. Oral exam: Dental hygiene is good. Lips and gums are healthy appearing. Heart:  Normal rate and regular rhythm. S1 and S2 normal without gallop, murmur, click, rub or other extra sounds   Lungs:Chest clear to auscultation; no wheezes, rhonchi,rales ,or rubs present.No increased work of breathing.  Abdomen: bowel sounds normal, soft and non-tender without masses, organomegaly or hernias noted.  No guarding or rebound.  Vascular : all pulses equal ; no bruits present. Skin:Warm & dry.  Intact without suspicious lesions or rashes ; no jaundice or tenting Lymphatic: No lymphadenopathy is noted about the head, neck, axilla Neuro: Strength, tone & DTRs normal.          Assessment & Plan:  #1 GERD with esophagitis.Antireflux symptoms interventions discussed #2 dyspnea ; multi factorial including deconditioning #3 fatigue; R/O Sleep Apnea.TSH

## 2014-03-07 NOTE — Progress Notes (Signed)
Pre visit review using our clinic review tool, if applicable. No additional management support is needed unless otherwise documented below in the visit note. 

## 2014-03-07 NOTE — Patient Instructions (Addendum)
Reflux of gastric acid may be asymptomatic as this may occur mainly during sleep.The triggers for reflux  include stress; the "aspirin family" ; alcohol; peppermint; and caffeine (coffee, tea, cola, and chocolate). The aspirin family would include aspirin and the nonsteroidal agents such as ibuprofen &  Naproxen. Tylenol would not cause reflux. If having symptoms ; food & drink should be avoided for @ least 2 hours before going to bed.   Please take a probiotic , Florastor OR Align, every day if the bowels are loose. This will replace the normal bacteria which  are necessary for formation of normal stool and processing of food.  The Pulmonary referral will be scheduled and you'll be notified of the time.Please call the Referral Co-Ordinator @ [phone] if you have not been notified of appointment time within 7-10 days.  Your next office appointment will be determined based upon review of your pending labs . Those instructions will be transmitted to you  by mail Followup as needed for your acute issue. Please report any significant change in your symptoms.

## 2014-03-08 ENCOUNTER — Other Ambulatory Visit: Payer: Self-pay | Admitting: Internal Medicine

## 2014-03-08 DIAGNOSIS — R946 Abnormal results of thyroid function studies: Secondary | ICD-10-CM

## 2014-03-10 ENCOUNTER — Encounter: Payer: Self-pay | Admitting: Family Medicine

## 2014-03-10 ENCOUNTER — Ambulatory Visit (INDEPENDENT_AMBULATORY_CARE_PROVIDER_SITE_OTHER): Payer: Medicare Other | Admitting: Family Medicine

## 2014-03-10 ENCOUNTER — Other Ambulatory Visit (INDEPENDENT_AMBULATORY_CARE_PROVIDER_SITE_OTHER): Payer: Medicare Other

## 2014-03-10 VITALS — BP 138/64 | HR 87 | Ht 74.0 in | Wt 263.0 lb

## 2014-03-10 DIAGNOSIS — E538 Deficiency of other specified B group vitamins: Secondary | ICD-10-CM

## 2014-03-10 DIAGNOSIS — M722 Plantar fascial fibromatosis: Secondary | ICD-10-CM | POA: Diagnosis not present

## 2014-03-10 DIAGNOSIS — G629 Polyneuropathy, unspecified: Secondary | ICD-10-CM

## 2014-03-10 DIAGNOSIS — R946 Abnormal results of thyroid function studies: Secondary | ICD-10-CM | POA: Diagnosis not present

## 2014-03-10 LAB — VITAMIN B12: Vitamin B-12: >1500 pg/mL — ABNORMAL HIGH (ref 211–911)

## 2014-03-10 LAB — T3, FREE: T3, Free: 4.1 pg/mL (ref 2.3–4.2)

## 2014-03-10 LAB — T4, FREE: Free T4: 1.08 ng/dL (ref 0.60–1.60)

## 2014-03-10 LAB — TSH: TSH: 4.31 u[IU]/mL (ref 0.35–4.50)

## 2014-03-10 MED ORDER — CYANOCOBALAMIN 1000 MCG/ML IJ SOLN
1000.0000 ug | Freq: Once | INTRAMUSCULAR | Status: AC
  Administered 2014-03-10: 1000 ug via INTRAMUSCULAR

## 2014-03-10 NOTE — Progress Notes (Signed)
[name] [address] [place], [place] [address] Phone: [phone] Subjective:    CC: right heel pain follow up  XEN:[id number] Patrick Kidd is a 69 y.o. male coming in with complaint of heel pain. Patient was seen previously and was diagnosed with a plantar fascitis on exam as well as ultrasound. Patient was given home exercises, icing protocol, as well as anti-inflammatories. Patient states 90% improvement, doing exercises, no icing, continue gabapentin and needs medicine or peripheral neuropathy could be significantly painful. Patient has had some difficulty with his balance recently. Patient though does not want to decrease this medication. Patient denies any numbness or tingling other than his peripheral neuropathy in his extremity. 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 Patient can walk significant more distance before having some discomfort. Patient though last week was having significant shortness of breath and had to go to the emergency department. Patient was actually treated for gastroesophageal reflux disease when all of the last came back unremarkable. Patient has been put on a medicine and has noticed some mild improvement. Patient has lost 18 pounds over the course last several weeks. Patient is trying to lose this and has stopped alcohol which has been beneficial.       Past medical history, social, surgical and family history all reviewed in electronic medical record.   Review of Systems: No headache, visual changes, nausea, vomiting, diarrhea, constipation, dizziness, abdominal pain, skin rash, fevers, chills, night sweats, weight loss, swollen lymph nodes, body aches, joint swelling, muscle aches, chest pain, shortness of breath, mood changes.   Objective Blood pressure 138/64, pulse 87, height 6\' 2"  (1.88 m), weight 263 lb  (119.296 kg), SpO2 97 %.  General: No apparent distress alert and oriented x3 mood and affect normal, dressed appropriately.  HEENT: Pupils equal, extraocular movements intact  Respiratory: Patient's speak in full sentences and does not appear short of breath  Cardiovascular: +1 lower extremity edema, non tender, no erythema  Skin: Warm dry intact with no signs of infection or rash on extremities or on axial skeleton.  Abdomen: Soft nontender  Neuro: Cranial nerves II through XII are intact, neurovascularly intact in all extremities with 2+ DTRs and 2+ pulses.  Lymph: No lymphadenopathy of posterior or anterior cervical chain or axillae bilaterally.  Gait normal with good balance and coordination.  MSK:  Non tender with full range of motion and good stability and symmetric strength and tone of shoulders, elbows, wrist, hip, knee and ankles bilaterally.   Foot exam shows the patient does have breakdown of the transverse and longitudinal arch bilaterally with overpronation of the hindfoot. Patient is less tender over the medial calcaneal region as well as on the plantar aspect of the heel. No erythema, swelling or bruising noted. Patient does have good range of motion of the ankle itself. Patient also has pitting edema to the mid calf bilaterally   MSK US performed of: Right foot This study was ordered, performed, and interpreted by Charlann Boxer D.O.  Foot/Ankle:   All structures visualized.   Talar dome unremarkable  Ankle mortise without effusion. Peroneus longus and brevis tendons unremarkable on long and transverse views without sheath effusions. Posterior tibialis, flexor hallucis longus, and flexor digitorum longus tendons unremarkable on long and transverse views without sheath effusions. Achilles tendon visualized along length of tendon and unremarkable on long and transverse views without sheath  effusion. Anterior Talofibular Ligament and Calcaneofibular Ligaments unremarkable and  intact. Deltoid Ligament unremarkable and intact. Plantar fascia intact but still swollen but improved  To 0.91cm from 1.19 cm. Posterior plantar heel spur still noted. Power doppler signal normal.  IMPRESSION: Plantar fasciitis with improvement.       Impression and Recommendations:     This case required medical decision making of moderate complexity.

## 2014-03-10 NOTE — Patient Instructions (Addendum)
Good to see you Consider the ice B12 1067mcg daily, we will get injection today Folate 425mcg daily Consider getting labs downstairs.  The thyroid can be playing a role continue with the weight loss.

## 2014-03-10 NOTE — Progress Notes (Signed)
Pre visit review using our clinic review tool, if applicable. No additional management support is needed unless otherwise documented below in the visit note. 

## 2014-03-11 NOTE — Assessment & Plan Note (Signed)
Likely secondary still to uncontrolled diabetes as well as likely some underlying alcohol abuse. Patient is seen neurology. Patient is on gabapentin 300 mg 3 times a day which might be a little excessive. Patient though was adamantly does not want to decrease due to the fear of worsening peripheral neuropathy pain. Patient did have somewhat of an elevated TSH repeat exam shows that this is trending in the this was likely possibly postviral infection that could've contributed. This can also be secondary to his impaired glucose tolerance. Differential also includes folate deficiency and B12 deficiency. We will get labs to evaluate and patient will start over-the-counter supplementation. She'll come back and see me again in 3 weeks for further evaluation and treatment.

## 2014-03-11 NOTE — Assessment & Plan Note (Signed)
Patient has made significant improvement. Encourage patient to continue to wear the good shoes. Discussed the importance of the icing protocol which patient still declined. Patient also declined any formal physical therapy. Encourage him to continue the exercises at this time. With patient having weight loss or peculiar improve quickly as well as be able to get him back to in walking regimen. We will discuss at follow-up in 3-4 weeks.

## 2014-03-16 ENCOUNTER — Ambulatory Visit (INDEPENDENT_AMBULATORY_CARE_PROVIDER_SITE_OTHER): Payer: Medicare Other | Admitting: Internal Medicine

## 2014-03-16 ENCOUNTER — Encounter: Payer: Self-pay | Admitting: Internal Medicine

## 2014-03-16 ENCOUNTER — Encounter (INDEPENDENT_AMBULATORY_CARE_PROVIDER_SITE_OTHER): Payer: Self-pay

## 2014-03-16 VITALS — BP 112/60 | HR 67 | Ht 74.0 in | Wt 258.0 lb

## 2014-03-16 DIAGNOSIS — R0600 Dyspnea, unspecified: Secondary | ICD-10-CM | POA: Diagnosis not present

## 2014-03-16 DIAGNOSIS — G4710 Hypersomnia, unspecified: Secondary | ICD-10-CM

## 2014-03-16 DIAGNOSIS — D696 Thrombocytopenia, unspecified: Secondary | ICD-10-CM | POA: Diagnosis not present

## 2014-03-16 MED ORDER — SUCRALFATE 1 G PO TABS: 1.0000 g | ORAL_TABLET | Freq: Three times a day (TID) | ORAL | Status: DC

## 2014-03-16 MED ORDER — OMEPRAZOLE 20 MG PO CPDR: 20.0000 mg | DELAYED_RELEASE_CAPSULE | Freq: Every day | ORAL | Status: DC

## 2014-03-16 MED ORDER — FAMOTIDINE 20 MG PO TABS: 20.0000 mg | ORAL_TABLET | Freq: Every day | ORAL | Status: DC

## 2014-03-16 NOTE — Patient Instructions (Addendum)
Try prilosec 20mg   Take 30-60 min before first meal of the day and Pepcid 20 mg one bedtime    GERD (REFLUX)  is an extremely common cause of respiratory symptoms just like yours , many times with no obvious heartburn at all.    It can be treated with medication, but also with lifestyle changes including avoidance of late meals, excessive alcohol, smoking cessation, and avoid fatty foods, chocolate, peppermint, colas, red wine, and acidic juices such as orange juice.  NO MINT OR MENTHOL PRODUCTS SO NO COUGH DROPS  USE SUGARLESS CANDY INSTEAD (Jolley ranchers or Stover's or Life Savers) or even ice chips will also do - the key is to swallow to prevent all throat clearing. NO OIL BASED VITAMINS - use powdered substitutes.  No napping during the day Read by dull light x 30 min and go to bed every night at same time  For drainage take chlortrimeton (chlorpheniramine) 4 mg every 4 hours available over the counter (may cause drowsiness) take up to 2 x at bedtime  If not happy with above, strongly recommend you see Drs [name] and Halford Chessman.

## 2014-03-16 NOTE — Progress Notes (Signed)
Subjective:     Patient ID: Patrick Kidd, male   DOB: [birth year]  MRN: [id number]  HPI  45 yowm quit smoking 1973 referred 03/16/2014 to pulmonary clinic by Dr Linna Darner for eval for sob/cough which were evaluated by Dr Halford Chessman last 06/2010 with nl pfts and felt to be related to GERD.    03/16/2014 1st [facility] office visit/ Patrick Kidd   Chief Complaint  Patient presents with  . Pulmonary Consult    Referred by Dr. Linna Darner. Pt states he is here for sleep evaluation. He went to ED on 03/03/14 with dyspnea and CP. He was started on acid suppression meds and now feels breathing is back to normal.   0nset of dyspnea was indolent and pattern is variable but much worse in jan 2015 assoc with cp and cough > to ER 03/03/14 and started on ppi's and doing much better and now focused on inability to sleep and wife reporting loud snoring with daytime fatigue and daytime drowsiness sleeping up to 3-4 h per day. No longer driving.   He's not aware of any noct wheeze or cough and his perception is he can't get to sleep or stay asleep due to pnds/ dry cough which have improved some on ppi.   No obvious day to day or daytime variabilty or assoc  chest tightness, subjective wheeze overt sinus or hb symptoms. No unusual exp hx or h/o childhood pna/ asthma or knowledge of premature birth.  vc Also denies any obvious fluctuation of symptoms with weather or environmental changes or other aggravating or alleviating factors except as outlined above   Current Medications, Allergies, Complete Past Medical History, Past Surgical History, Family History, and Social History were reviewed in [employer] record.  ROS  The following are not active complaints unless bolded sore throat, dysphagia, dental problems, itching, sneezing,  nasal congestion or excess/ purulent secretions, ear ache,   fever, chills, sweats, unintended wt loss, pleuritic or exertional cp, hemoptysis,  orthopnea pnd or leg  swelling, presyncope, palpitations, heartburn, abdominal pain, anorexia, nausea, vomiting, diarrhea  or change in bowel or urinary habits, change in stools or urine, dysuria,hematuria,  rash, arthralgias, visual complaints, headache, numbness weakness or ataxia or problems with walking or coordination,  change in mood/affect or memory.           Review of Systems     Objective:   Physical Exam amb wm nad  Wt Readings from Last 3 Encounters:  03/16/14 258 lb (117.028 kg)  03/10/14 263 lb (119.296 kg)  03/07/14 267 lb (121.11 kg)    Vital signs reviewed  HEENT: nl dentition, turbinates, and orophanx. Nl external ear canals without cough reflex   NECK :  without JVD/Nodes/TM/ nl carotid upstrokes bilaterally   LUNGS: no acc muscle use, clear to A and P bilaterally without cough on insp or exp maneuvers   CV:  RRR  no s3 or murmur or increase in P2, no edema   ABD:  soft and nontender with nl excursion in the supine position. No bruits or organomegaly, bowel sounds nl  MS:  warm without deformities, calf tenderness, cyanosis or clubbing  SKIN: warm and dry without lesions    NEURO:  alert, approp, no deficits    CTa chest:  03/03/14  I personally reviewed images and agree with radiology impression as follows:   No evidence of significant pulmonary embolus. No evidence of active pulmonary disease. Diffuse fatty infiltration of the liver with large cyst in the  right lobe.      Chemistry      Component Value Date/Time   NA 136 03/03/2014 1823   K 4.0 03/03/2014 1823   CL 101 03/03/2014 1823   CO2 23 03/03/2014 1823   BUN 12 03/03/2014 1823   CREATININE 0.67 03/03/2014 1823   CREATININE 1.13 06/04/2010 1354      Component Value Date/Time   CALCIUM 9.0 03/03/2014 1823   ALKPHOS 81 03/03/2014 2016   AST 55* 03/03/2014 2016   ALT 26 03/03/2014 2016   BILITOT 1.9* 03/03/2014 2016       Lab Results  Component Value Date   TSH 4.31 03/10/2014     Lab Results   Component Value Date   WBC 5.7 03/03/2014   HGB 15.5 03/03/2014   HCT 43.2 03/03/2014   MCV 101.6* 03/03/2014   PLT 62* 03/03/2014         Assessment:

## 2014-03-17 ENCOUNTER — Other Ambulatory Visit: Payer: Self-pay | Admitting: Family Medicine

## 2014-03-17 NOTE — Telephone Encounter (Signed)
Refill done.  

## 2014-03-19 ENCOUNTER — Encounter: Payer: Self-pay | Admitting: Internal Medicine

## 2014-03-19 DIAGNOSIS — D696 Thrombocytopenia, unspecified: Secondary | ICD-10-CM | POA: Insufficient documentation

## 2014-03-19 DIAGNOSIS — G4710 Hypersomnia, unspecified: Secondary | ICD-10-CM | POA: Insufficient documentation

## 2014-03-19 NOTE — Assessment & Plan Note (Signed)
His sleep hypgiene is extremely poor and he reported more insomnia to me than a hx suggesting sleep apnea though his wife apparently reports snoring  For now will try new approach to sleep and use 1st gen h1 to see if we can reset his clock internally and get him to quit napping so much and f/u with Dr Halford Chessman prn as he is no longer driving so sleep eval can be prn at pt's or primary care's request

## 2014-03-19 NOTE — Assessment & Plan Note (Addendum)
Lab Results  Component Value Date   PLT 62* 03/03/2014   PLT 98.0* 07/18/2013   PLT 120.0* 07/16/2012     Currently asymptomatic by progressive ? Etiology > defer to primary care/ ? Refer to Heme/ could this be hypersplenism?

## 2014-03-19 NOTE — Assessment & Plan Note (Signed)
PFT's nl 06/21/10   Symptoms are markedly disproportionate to objective findings and not clear this is a lung problem but pt does appear to have difficult airway management issues. DDX of  difficult airways management all start with A and  include Adherence, Ace Inhibitors, Acid Reflux, Active Sinus Disease, Alpha 1 Antitripsin deficiency, Anxiety masquerading as Airways dz,  ABPA,  allergy(esp in young), Aspiration (esp in elderly), Adverse effects of DPI,  Active smokers, plus two Bs  = Bronchiectasis and Beta blocker use..and one C= CHF  Adherence is always the initial "prime suspect" and is a multilayered concern that requires a "trust but verify" approach in every patient - starting with knowing how to use medications, especially inhalers, correctly, keeping up with refills and understanding the fundamental difference between maintenance and prns vs those medications only taken for a very short course and then stopped and not refilled.  - reviewed optimal timing for ppi vs h2   Acid (or non-acid) GERD > always difficult to exclude as up to 75% of pts in some series report no assoc GI/ Heartburn symptoms> rec max (24h)  acid suppression and diet restrictions/ reviewed and instructions given in writing  ? Allergy/ pnds > try chlortrimeton, esp the hs dose, to see if can improve sleep time since sleep quality may be an issue  Refer back to Dr Halford Chessman, his pulmonologist of record, for f/u.

## 2014-03-29 ENCOUNTER — Other Ambulatory Visit: Payer: Self-pay | Admitting: Internal Medicine

## 2014-03-29 MED ORDER — FAMOTIDINE 20 MG PO TABS: 20.0000 mg | ORAL_TABLET | Freq: Every day | ORAL | Status: DC

## 2014-06-23 DIAGNOSIS — L920 Granuloma annulare: Secondary | ICD-10-CM | POA: Diagnosis not present

## 2014-08-15 ENCOUNTER — Encounter: Payer: Self-pay | Admitting: Neurology

## 2014-08-15 ENCOUNTER — Ambulatory Visit (INDEPENDENT_AMBULATORY_CARE_PROVIDER_SITE_OTHER): Payer: Medicare Other | Admitting: Neurology

## 2014-08-15 VITALS — BP 140/62 | HR 108 | Resp 22 | Ht 74.0 in | Wt 268.5 lb

## 2014-08-15 DIAGNOSIS — G629 Polyneuropathy, unspecified: Secondary | ICD-10-CM | POA: Diagnosis not present

## 2014-08-15 MED ORDER — GABAPENTIN 300 MG PO CAPS: 300.0000 mg | ORAL_CAPSULE | Freq: Four times a day (QID) | ORAL | Status: DC

## 2014-08-15 NOTE — Progress Notes (Signed)
NEUROLOGY FOLLOW UP OFFICE NOTE  Patrick Kidd [id number]  HISTORY OF PRESENT ILLNESS: Patrick Kidd is a 69 y.o. right-handed male who follows up for peripheral neuropathy.     UPDATE: Gabapentin was increased to 376m four times daily, which helps.  He has had a sprained right ankle for some time.  He saw Sports Medicine who told him to wear different shoes.  He notes instability in the ankle.  HISTORY: Onset of symptoms began in 2010.  He describes initially experiencing numbness on the bottom of both feet, in which recently it has spread to the ankles. It is also associated with an uncomfortable tingling sensation. It is not painful but it is uncomfortable and usually bothers him at night.  He doesn't really notice it if he is active.  He is concerned about his balance.  The first time, he was getting out of a boat and felt that his right leg was not strong enough to hold his weight and he fell to his knees. The second time, he tripped over a chair and fell down to his knees.  He felt that his leg just gave out when it paired his weight. He is particularly concerned, because he does enjoy riding on a boat and dancing.  More recently, he stood up from a chair at a game and felt shaky and unsteady.  He has not been exercising as much due to fear of imbalance.  He denies any pain radiating down the legs.  He has pain in his right big toe after an 8 pound frozen duck breast fell on it.  He has tingling in the finger tips.  Labs from 08/04/12 revealed ESR 29, ANA negative, Lyme negative.  SPEP revealed possible faint restricted band in the gamma region, IFE revealed polyclonal gammopathy involving IgA and IgM.  Prior labs last month revealed Hgb A1c 5.7, TSH 2.42, RPR non-reactive, serum glucose 131.  CBC and CMP otherwise unremarkable.  B12 from 5/12 was 1293.  Fasting glucose was 143 and 2 hour glucose tolerance test was 341.  Labs from 01/11/13 revealed Hgb A1c of 5.8.  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  PAST MEDICAL HISTORY: Past Medical History  Diagnosis Date  . Gout   . Chronic cough     PMH of  . GERD (gastroesophageal reflux disease)   . Peripheral neuropathy   . Diverticulosis 07-03-2010    Colonoscopy.   .Marland KitchenHx of adenomatous colonic polyps 07-03-2010    Colonoscopy. F/U due 2017  . Plantar fasciitis     PMH of  . Hydrocele   . Acrophobia   . Nonspecific elevation of levels of transaminase or lactic acid dehydrogenase (LDH)     MEDICATIONS: Current Outpatient Prescriptions on File Prior to Visit  Medication Sig Dispense Refill  . Cholecalciferol (VITAMIN D) 2000 UNITS tablet Take 2,000 Units by mouth daily.    . famotidine (PEPCID) 20 MG tablet Take 1 tablet (20 mg total) by mouth at bedtime. 30 tablet 11  . furosemide (LASIX) 40 MG tablet Take 1 tablet (40 mg total) by mouth as needed. (Patient taking differently: Take 40 mg by mouth as needed for fluid. ) 30 tablet 5  . meloxicam (MOBIC) 15 MG tablet TAKE 1 TABLET BY MOUTH EVERY DAY 30 tablet 3  . Multiple Vitamin (MULTIVITAMIN) tablet Take 1 tablet by mouth daily.    .Marland Kitchenomeprazole (PRILOSEC) 20 MG capsule Take 1 capsule (20 mg  total) by mouth daily. 30 capsule 11  . sildenafil (VIAGRA) 100 MG tablet Take 1 tablet (100 mg total) by mouth daily as needed. 1/2-1 by mouth as needed 6 tablet 6  . simethicone (GAS-X) 80 MG chewable tablet Chew 1 tablet (80 mg total) by mouth every 6 (six) hours as needed for flatulence. 30 tablet 0  . sucralfate (CARAFATE) 1 G tablet Take 1 tablet (1 g total) by mouth 4 (four) times daily -  with meals and at bedtime. (Patient not taking: Reported on 08/15/2014) 30 tablet 0   Current Facility-Administered Medications on File Prior to Visit  Medication Dose Route Frequency Provider Last Rate Last Dose  . 0.9 %  sodium chloride infusion  500 mL Intravenous Continuous Gatha Mayer, MD        ALLERGIES: No Known  Allergies  FAMILY HISTORY: Family History  Problem Relation Age of Onset  . Lung cancer Mother     smoker  . Leukemia Father     Acute myelocytic  . Diabetes Neg Hx   . Stroke Neg Hx   . Heart disease Neg Hx     SOCIAL HISTORY: History   Social History  . Marital Status: Married    Spouse Name: N/A  . Number of Children: 2  . Years of Education: N/A   Occupational History  . Commercial real estate    Social History Main Topics  . Smoking status: Former Smoker -- 2.00 packs/day for 6 years    Types: Cigarettes    Quit date: 02/04/1971  . Smokeless tobacco: Never Used     Comment: smoked age [age], up to 2 ppd  . Alcohol Use: 12.6 oz/week    21 Shots of liquor per week     Comment: 2-3 a day  . Drug Use: No  . Sexual Activity:    Partners: Female   Other Topics Concern  . Not on file   Social History Narrative    REVIEW OF SYSTEMS: Constitutional: No fevers, chills, or sweats, no generalized fatigue, change in appetite Eyes: No visual changes, double vision, eye pain Ear, nose and throat: No hearing loss, ear pain, nasal congestion, sore throat Cardiovascular: No chest pain, palpitations Respiratory:  No shortness of breath at rest or with exertion, wheezes GastrointestinaI: No nausea, vomiting, diarrhea, abdominal pain, fecal incontinence Genitourinary:  No dysuria, urinary retention or frequency Musculoskeletal:  No neck pain, back pain Integumentary: No rash, pruritus, skin lesions Neurological: as above Psychiatric: No depression, insomnia, anxiety Endocrine: No palpitations, fatigue, diaphoresis, mood swings, change in appetite, change in weight, increased thirst Hematologic/Lymphatic:  No anemia, purpura, petechiae. Allergic/Immunologic: no itchy/runny eyes, nasal congestion, recent allergic reactions, rashes  PHYSICAL EXAM: Filed Vitals:   08/15/14 1343  BP: 140/62  Pulse: 108  Resp: 22   General: No acute distress.  Patient appears  well-groomed.  overweight body habitus. Head:  Normocephalic/atraumatic Eyes:  Fundoscopic exam unremarkable without vessel changes, exudates, hemorrhages or papilledema. Neck: supple, no paraspinal tenderness, full range of motion Heart:  Regular rate and rhythm Lungs:  Clear to auscultation bilaterally Back: No paraspinal tenderness Neurological Exam: alert and oriented to person, place, and time. Attention span and concentration intact, recent and remote memory intact, fund of knowledge intact.  Speech fluent and not dysarthric, language intact.  CN II-XII intact. Fundoscopic exam unremarkable without vessel changes, exudates, hemorrhages or papilledema.  Bulk and tone normal, muscle strength 5/5 throughout.  Reduced pinprick, vibration in the feet up to the ankles.  Reduced proprioception in the toes.  Deep tendon reflexes 1+ in upper extremities and absent in lower extremities, toes downgoing.  Mottling of the feet noted.  Finger to nose intact.  Gait mildly wide based but steady.  Romberg with sway  IMPRESSION: Idiopathic peripheral neuropathy  PLAN: Gabapentin 381m four times daily See Sports Medicine about ankle brace See PCP for blood pressure recheck Follow up in one year  15 minutes spent face to face with patient, over 50% spent discussing management.  AMetta Clines DO  CC: WUnice Cobble MD

## 2014-10-25 ENCOUNTER — Telehealth: Payer: Self-pay | Admitting: Internal Medicine

## 2014-10-25 ENCOUNTER — Other Ambulatory Visit: Payer: Self-pay | Admitting: Emergency Medicine

## 2014-10-25 MED ORDER — FUROSEMIDE 40 MG PO TABS: 40.0000 mg | ORAL_TABLET | ORAL | Status: DC | PRN

## 2014-10-25 NOTE — Telephone Encounter (Signed)
Patient requesting refill for furosemide (LASIX) 40 MG tablet [[id number] Pharmacy is [facility] on [address]

## 2014-11-15 ENCOUNTER — Ambulatory Visit (INDEPENDENT_AMBULATORY_CARE_PROVIDER_SITE_OTHER): Payer: Medicare Other | Admitting: Internal Medicine

## 2014-11-15 ENCOUNTER — Other Ambulatory Visit (INDEPENDENT_AMBULATORY_CARE_PROVIDER_SITE_OTHER): Payer: Medicare Other

## 2014-11-15 VITALS — BP 130/70 | HR 89 | Temp 98.3°F | Ht 74.0 in | Wt 269.0 lb

## 2014-11-15 DIAGNOSIS — D890 Polyclonal hypergammaglobulinemia: Secondary | ICD-10-CM | POA: Diagnosis not present

## 2014-11-15 DIAGNOSIS — K2100 Gastro-esophageal reflux disease with esophagitis, without bleeding: Secondary | ICD-10-CM

## 2014-11-15 DIAGNOSIS — R609 Edema, unspecified: Secondary | ICD-10-CM

## 2014-11-15 DIAGNOSIS — N429 Disorder of prostate, unspecified: Secondary | ICD-10-CM

## 2014-11-15 DIAGNOSIS — Z86010 Personal history of colonic polyps: Secondary | ICD-10-CM | POA: Diagnosis not present

## 2014-11-15 DIAGNOSIS — Z87898 Personal history of other specified conditions: Secondary | ICD-10-CM

## 2014-11-15 DIAGNOSIS — R350 Frequency of micturition: Secondary | ICD-10-CM | POA: Diagnosis not present

## 2014-11-15 DIAGNOSIS — R7302 Impaired glucose tolerance (oral): Secondary | ICD-10-CM

## 2014-11-15 LAB — CBC WITH DIFFERENTIAL/PLATELET
BASOS PCT: 0.3 % (ref 0.0–3.0)
Basophils Absolute: 0 10*3/uL (ref 0.0–0.1)
EOS PCT: 1.2 % (ref 0.0–5.0)
Eosinophils Absolute: 0.1 10*3/uL (ref 0.0–0.7)
HCT: 49.4 % (ref 39.0–52.0)
HEMOGLOBIN: 16.7 g/dL (ref 13.0–17.0)
LYMPHS ABS: 2.3 10*3/uL (ref 0.7–4.0)
Lymphocytes Relative: 37.6 % (ref 12.0–46.0)
MCHC: 33.8 g/dL (ref 30.0–36.0)
MCV: 104.6 fl — AB (ref 78.0–100.0)
MONO ABS: 0.5 10*3/uL (ref 0.1–1.0)
Monocytes Relative: 8.6 % (ref 3.0–12.0)
NEUTROS PCT: 52.3 % (ref 43.0–77.0)
Neutro Abs: 3.2 10*3/uL (ref 1.4–7.7)
Platelets: 99 10*3/uL — ABNORMAL LOW (ref 150.0–400.0)
RBC: 4.72 Mil/uL (ref 4.22–5.81)
RDW: 13.7 % (ref 11.5–15.5)
WBC: 6.1 10*3/uL (ref 4.0–10.5)

## 2014-11-15 LAB — BASIC METABOLIC PANEL
BUN: 9 mg/dL (ref 6–23)
CHLORIDE: 100 meq/L (ref 96–112)
CO2: 29 mEq/L (ref 19–32)
Calcium: 9.2 mg/dL (ref 8.4–10.5)
Creatinine, Ser: 0.84 mg/dL (ref 0.40–1.50)
GFR: 96 mL/min (ref >60.00)
GLUCOSE: 164 mg/dL — AB (ref 70–99)
POTASSIUM: 4.3 meq/L (ref 3.5–5.1)
Sodium: 140 mEq/L (ref 135–145)

## 2014-11-15 LAB — HEPATIC FUNCTION PANEL
ALBUMIN: 4.1 g/dL (ref 3.5–5.2)
ALT: 26 U/L (ref 0–53)
AST: 45 U/L — ABNORMAL HIGH (ref 0–37)
Alkaline Phosphatase: 79 U/L (ref 39–117)
BILIRUBIN TOTAL: 1.4 mg/dL — AB (ref 0.2–1.2)
Bilirubin, Direct: 0.5 mg/dL — ABNORMAL HIGH (ref 0.0–0.3)
Total Protein: 7.3 g/dL (ref 6.0–8.3)

## 2014-11-15 LAB — LIPID PANEL
CHOLESTEROL: 184 mg/dL (ref 0–200)
HDL: 72.3 mg/dL (ref >39.00)
LDL CALC: 91 mg/dL (ref 0–99)
NonHDL: 111.89
TRIGLYCERIDES: 105 mg/dL (ref 0.0–149.0)
Total CHOL/HDL Ratio: 3
VLDL: 21 mg/dL (ref 0.0–40.0)

## 2014-11-15 LAB — URINALYSIS
BILIRUBIN URINE: NEGATIVE
HGB URINE DIPSTICK: NEGATIVE
Ketones, ur: 15 — AB
LEUKOCYTES UA: NEGATIVE
NITRITE: NEGATIVE
Specific Gravity, Urine: 1.02 (ref 1.000–1.030)
TOTAL PROTEIN, URINE-UPE24: NEGATIVE
UROBILINOGEN UA: 2 — AB (ref 0.0–1.0)
Urine Glucose: NEGATIVE
pH: 6.5 (ref 5.0–8.0)

## 2014-11-15 LAB — HEMOGLOBIN A1C: HEMOGLOBIN A1C: 7.1 % — AB (ref 4.6–6.5)

## 2014-11-15 LAB — PSA: PSA: 7.07 ng/mL — AB (ref 0.10–4.00)

## 2014-11-15 LAB — TSH: TSH: 1.07 u[IU]/mL (ref 0.35–4.50)

## 2014-11-15 MED ORDER — SILDENAFIL CITRATE 20 MG PO TABS: ORAL_TABLET | ORAL | Status: DC

## 2014-11-15 NOTE — Assessment & Plan Note (Signed)
CBC & dif Chem 20

## 2014-11-15 NOTE — Assessment & Plan Note (Signed)
PSA

## 2014-11-15 NOTE — Progress Notes (Signed)
   Subjective:    Patient ID: Patrick Kidd, male    DOB: 1945-09-19, 69 y.o.   MRN: [id number]  HPI The patient is here to assess status of active health conditions.  PMH, FH, & Social History reviewed & updated.No change in [place] as recorded.  He has been compliant with his medicines without adverse effects. He does not add salt at the table. He has decreased red meat and fried foods. He's been exercising 1-2 times per week on the bike at the gym without cardiopulmonary symptoms.  He does have edema greater in the right than the left leg. It does improve overnight.  He was seen in the ER January this year with dyspnea and abdominal distention but not chest pain. He had an extensive evaluation which was reviewed. His symptoms resolved taking Pepcid as needed.  Gabapentin controls pain in his legs ;but he continues to have bilateral numbness. The neuropathy profoundly affects his gait.  He's been treated by the Dermatologist for dermatitis with a topical steroid without benefit.  He has urinary frequency as well as nocturia 2 times nightly.  Last labs on record revealed therapeutic thyroid function test on 03/10/14. AST was 55 and ALT 26 on 03/03/14 in the ER.  Review of Systems  Chest pain, palpitations, tachycardia, exertional dyspnea, paroxysmal nocturnal dyspnea, or claudication are absent @ this time. No unexplained weight loss, abdominal pain, significant dyspepsia, dysphagia, melena, rectal bleeding, or persistently small caliber stools. Dysuria, pyuria, hematuria, or polyuria are denied. Change in hair, skin, nails denied. No bowel changes of constipation or diarrhea. No intolerance to heat or cold.     Objective:   Physical Exam  Pertinent or positive findings include:Pattern alopecia is present. He has wax impactions both ears. Hearing is normal to whisper at 6 feet. He has erythema of the oropharynx. Abdomen is massively protuberant. He has a ventral hernia. There is marked  crepitus of the knees. Degenerative reflexes are 0+ the knees. He has marked stasis hyperpigmentation over the lower extremities. He has 1+ edema on the right and 1/2+ pitting edema on the left. Scrotum is tight. Testicles cannot really be defined. Prostate is slightly largeron the right without nodularity.  General appearance :adequately nourished; in no distress.  Eyes: No conjunctival inflammation or scleral icterus is present.  Oral exam:  Lips and gums are healthy appearing.  Heart:  Normal rate and regular rhythm. S1 and S2 normal without gallop, murmur, click, rub or other extra sounds    Lungs:Chest clear to auscultation; no wheezes, rhonchi,rales ,or rubs present.No increased work of breathing.   Abdomen: bowel sounds normal, soft and non-tender without masses, or organomegaly noted.  No guarding or rebound. No flank tenderness to percussion.  Vascular : all pulses equal ; no bruits present.  Skin:Warm & dry.  Intact without suspicious lesions or rashes ; no tenting or jaundice   Lymphatic: No lymphadenopathy is noted about the head, neck, axilla, or inguinal areas.   Neuro: Strength, tone  normal.      Assessment & Plan:  See Current Assessment & Plan in Problem List under specific Diagnosis

## 2014-11-15 NOTE — Patient Instructions (Signed)
  Your next office appointment will be determined based upon review of your pending labs. Those written interpretation of the lab results and instructions will be transmitted to you by mail for your records.  Critical results will be called.   Followup as needed for any active or acute issue. Please report any significant change in your symptoms. 

## 2014-11-15 NOTE — Progress Notes (Signed)
Pre visit review using our clinic review tool, if applicable. No additional management support is needed unless otherwise documented below in the visit note. 

## 2014-11-15 NOTE — Assessment & Plan Note (Signed)
A1c

## 2014-11-15 NOTE — Assessment & Plan Note (Signed)
BMET LFT TSH

## 2014-11-15 NOTE — Assessment & Plan Note (Signed)
Colonoscopy 2017.

## 2014-11-18 ENCOUNTER — Encounter: Payer: Self-pay | Admitting: Internal Medicine

## 2014-11-18 ENCOUNTER — Other Ambulatory Visit: Payer: Self-pay | Admitting: Internal Medicine

## 2014-11-18 DIAGNOSIS — R972 Elevated prostate specific antigen [PSA]: Secondary | ICD-10-CM | POA: Insufficient documentation

## 2015-01-08 DIAGNOSIS — R351 Nocturia: Secondary | ICD-10-CM | POA: Diagnosis not present

## 2015-01-08 DIAGNOSIS — N3941 Urge incontinence: Secondary | ICD-10-CM | POA: Diagnosis not present

## 2015-01-08 DIAGNOSIS — R350 Frequency of micturition: Secondary | ICD-10-CM | POA: Diagnosis not present

## 2015-01-25 ENCOUNTER — Ambulatory Visit (INDEPENDENT_AMBULATORY_CARE_PROVIDER_SITE_OTHER): Payer: Medicare Other | Admitting: Family

## 2015-01-25 ENCOUNTER — Encounter: Payer: Self-pay | Admitting: Family

## 2015-01-25 VITALS — BP 134/78 | HR 99 | Temp 98.0°F | Resp 18 | Ht 74.0 in | Wt 272.0 lb

## 2015-01-25 DIAGNOSIS — J069 Acute upper respiratory infection, unspecified: Secondary | ICD-10-CM

## 2015-01-25 MED ORDER — AZITHROMYCIN 250 MG PO TABS: ORAL_TABLET | ORAL | Status: DC

## 2015-01-25 MED ORDER — BENZONATATE 100 MG PO CAPS: 100.0000 mg | ORAL_CAPSULE | Freq: Two times a day (BID) | ORAL | Status: DC | PRN

## 2015-01-25 NOTE — Patient Instructions (Signed)
Thank you for choosing [facility].  Summary/Instructions:  Your prescription(s) have been submitted to your pharmacy or been printed and provided for you. Please take as directed and contact our office if you believe you are having problem(s) with the medication(s) or have any questions.  If your symptoms worsen or fail to improve, please contact our office for further instruction, or in case of emergency go directly to the emergency room at the closest medical facility.    General Recommendations:    Please drink plenty of fluids.  Get plenty of rest   Sleep in humidified air  Use saline nasal sprays  Netti pot   OTC Medications:  Decongestants - helps relieve congestion   Flonase (generic fluticasone) or Nasacort (generic triamcinolone) - please make sure to use the "cross-over" technique at a 45 degree angle towards the opposite eye as opposed to straight up the nasal passageway.   Sudafed (generic pseudoephedrine - Note this is the one that is available behind the pharmacy counter); Products with phenylephrine (-PE) may also be used but is often not as effective as pseudoephedrine.   If you have HIGH BLOOD PRESSURE - Coricidin HBP; AVOID any product that is -D as this contains pseudoephedrine which may increase your blood pressure.  Afrin (oxymetazoline) every 6-8 hours for up to 3 days.   Allergies - helps relieve runny nose, itchy eyes and sneezing   Claritin (generic loratidine), Allegra (fexofenidine), or Zyrtec (generic cyrterizine) for runny nose. These medications should not cause drowsiness.  Note - Benadryl (generic diphenhydramine) may be used however may cause drowsiness  Cough -   Delsym or Robitussin (generic dextromethorphan)  Expectorants - helps loosen mucus to ease removal   Mucinex (generic guaifenesin) as directed on the package.  Headaches / General Aches   Tylenol (generic acetaminophen) - DO NOT EXCEED 3 grams (3,000 mg) in a 24  hour time period  Advil/Motrin (generic ibuprofen)   Sore Throat -   Salt water gargle   Chloraseptic (generic benzocaine) spray or lozenges / Sucrets (generic dyclonine)    Upper Respiratory Infection, Adult Most upper respiratory infections (URIs) are a viral infection of the air passages leading to the lungs. A URI affects the nose, throat, and upper air passages. The most common type of URI is nasopharyngitis and is typically referred to as "the common cold." URIs run their course and usually go away on their own. Most of the time, a URI does not require medical attention, but sometimes a bacterial infection in the upper airways can follow a viral infection. This is called a secondary infection. Sinus and middle ear infections are common types of secondary upper respiratory infections. Bacterial pneumonia can also complicate a URI. A URI can worsen asthma and chronic obstructive pulmonary disease (COPD). Sometimes, these complications can require emergency medical care and may be life threatening.  CAUSES Almost all URIs are caused by viruses. A virus is a type of germ and can spread from one person to another.  RISKS FACTORS You may be at risk for a URI if:   You smoke.   You have chronic heart or lung disease.  You have a weakened defense (immune) system.   You are very young or very old.   You have nasal allergies or asthma.  You work in crowded or poorly ventilated areas.  You work in health care facilities or schools. SIGNS AND SYMPTOMS  Symptoms typically develop 2-3 days after you come in contact with a cold virus. Most   viral URIs last 7-10 days. However, viral URIs from the influenza virus (flu virus) can last 14-18 days and are typically more severe. Symptoms may include:   Runny or stuffy (congested) nose.   Sneezing.   Cough.   Sore throat.   Headache.   Fatigue.   Fever.   Loss of appetite.   Pain in your forehead, behind your eyes, and  over your cheekbones (sinus pain).  Muscle aches.  DIAGNOSIS  Your health care provider may diagnose a URI by:  Physical exam.  Tests to check that your symptoms are not due to another condition such as:  Strep throat.  Sinusitis.  Pneumonia.  Asthma. TREATMENT  A URI goes away on its own with time. It cannot be cured with medicines, but medicines may be prescribed or recommended to relieve symptoms. Medicines may help:  Reduce your fever.  Reduce your cough.  Relieve nasal congestion. HOME CARE INSTRUCTIONS   Take medicines only as directed by your health care provider.   Gargle warm saltwater or take cough drops to comfort your throat as directed by your health care provider.  Use a warm mist humidifier or inhale steam from a shower to increase air moisture. This may make it easier to breathe.  Drink enough fluid to keep your urine clear or pale yellow.   Eat soups and other clear broths and maintain good nutrition.   Rest as needed.   Return to work when your temperature has returned to normal or as your health care provider advises. You may need to stay home longer to avoid infecting others. You can also use a face mask and careful hand washing to prevent spread of the virus.  Increase the usage of your inhaler if you have asthma.   Do not use any tobacco products, including cigarettes, chewing tobacco, or electronic cigarettes. If you need help quitting, ask your health care provider. PREVENTION  The best way to protect yourself from getting a cold is to practice good hygiene.   Avoid oral or hand contact with people with cold symptoms.   Wash your hands often if contact occurs.  There is no clear evidence that vitamin C, vitamin E, echinacea, or exercise reduces the chance of developing a cold. However, it is always recommended to get plenty of rest, exercise, and practice good nutrition.  SEEK MEDICAL CARE IF:   You are getting worse rather than  better.   Your symptoms are not controlled by medicine.   You have chills.  You have worsening shortness of breath.  You have brown or red mucus.  You have yellow or brown nasal discharge.  You have pain in your face, especially when you bend forward.  You have a fever.  You have swollen neck glands.  You have pain while swallowing.  You have white areas in the back of your throat. SEEK IMMEDIATE MEDICAL CARE IF:   You have severe or persistent:  Headache.  Ear pain.  Sinus pain.  Chest pain.  You have chronic lung disease and any of the following:  Wheezing.  Prolonged cough.  Coughing up blood.  A change in your usual mucus.  You have a stiff neck.  You have changes in your:  Vision.  Hearing.  Thinking.  Mood. MAKE SURE YOU:   Understand these instructions.  Will watch your condition.  Will get help right away if you are not doing well or get worse.   This information is not intended to replace advice   given to you by your health care provider. Make sure you discuss any questions you have with your health care provider.   Document Released: 07/16/2000 Document Revised: 06/06/2014 Document Reviewed: 04/27/2013 Elsevier Interactive Patient Education 2016 Elsevier Inc.  

## 2015-01-25 NOTE — Assessment & Plan Note (Signed)
Symptoms and exam consistent with acute upper respiratory infection. Start azithromycin. Start Tessalon as needed for cough. Continue over-the-counter medications as needed for symptom relief and supportive care. Follow-up if symptoms worsen or fail to improve.

## 2015-01-25 NOTE — Progress Notes (Signed)
Subjective:    Patient ID: Patrick Kidd, male    DOB: 1945-05-29, 69 y.o.   MRN: [id number]  Chief Complaint  Patient presents with  . Cough    cough, fatigue, sinus pressure and congestion    HPI:  Patrick Kidd is a 69 y.o. male who  has a past medical history of Gout; Chronic cough; GERD (gastroesophageal reflux disease); Peripheral neuropathy ([place]); Diverticulosis (07-03-2010); adenomatous colonic polyps (07-03-2010); Plantar fasciitis; Hydrocele; Acrophobia; and Nonspecific elevation of levels of transaminase or lactic acid dehydrogenase (LDH). and presents today for an acute office visit.  This is a new problem. Associated symptoms of cough, fatigue, sinus pressure and congestion have been going on for week. Modifying factors include Airborne and Aleve which did not help very much. Over the course of the week he started getting better and then got worse again. Denies fevers. No recent antibiotic use.    No Known Allergies   Current Outpatient Prescriptions on File Prior to Visit  Medication Sig Dispense Refill  . Cholecalciferol (VITAMIN D) 2000 UNITS tablet Take 2,000 Units by mouth daily.    . famotidine (PEPCID) 20 MG tablet Take 1 tablet (20 mg total) by mouth at bedtime. 30 tablet 11  . furosemide (LASIX) 40 MG tablet Take 1 tablet (40 mg total) by mouth as needed. 30 tablet 5  . gabapentin (NEURONTIN) 300 MG capsule Take 1 capsule (300 mg total) by mouth 4 (four) times daily. 120 capsule 11  . meloxicam (MOBIC) 15 MG tablet TAKE 1 TABLET BY MOUTH EVERY DAY 30 tablet 3  . Multiple Vitamin (MULTIVITAMIN) tablet Take 1 tablet by mouth daily.    Marland Kitchen omeprazole (PRILOSEC) 20 MG capsule Take 1 capsule (20 mg total) by mouth daily. 30 capsule 11  . sildenafil (REVATIO) 20 MG tablet 2-5 qd prn 50 tablet 1  . sildenafil (VIAGRA) 100 MG tablet Take 1 tablet (100 mg total) by mouth daily as needed. 1/2-1 by mouth as needed 6 tablet 6  . simethicone (GAS-X) 80 MG chewable  tablet Chew 1 tablet (80 mg total) by mouth every 6 (six) hours as needed for flatulence. 30 tablet 0  . sucralfate (CARAFATE) 1 G tablet Take 1 tablet (1 g total) by mouth 4 (four) times daily -  with meals and at bedtime. 30 tablet 0   No current facility-administered medications on file prior to visit.     Review of Systems  Constitutional: Negative for fever and chills.  HENT: Positive for congestion, sinus pressure and sore throat.   Respiratory: Positive for cough and shortness of breath (occasional). Negative for chest tightness.   Neurological: Negative for headaches.      Objective:    BP 134/78 mmHg  Pulse 99  Temp(Src) 98 F (36.7 C) (Oral)  Resp 18  Ht 6\' 2"  (1.88 m)  Wt 272 lb (123.378 kg)  BMI 34.91 kg/m2  SpO2 91% Nursing note and vital signs reviewed.  Physical Exam  Constitutional: He is oriented to person, place, and time. He appears well-developed and well-nourished. No distress.  HENT:  Right Ear: Hearing, tympanic membrane, external ear and ear canal normal.  Left Ear: Hearing, tympanic membrane, external ear and ear canal normal.  Nose: Nose normal. Right sinus exhibits no maxillary sinus tenderness and no frontal sinus tenderness. Left sinus exhibits no maxillary sinus tenderness and no frontal sinus tenderness.  Mouth/Throat: Uvula is midline, oropharynx is clear and moist and mucous membranes are normal.  Cardiovascular: Normal rate,  regular rhythm, normal heart sounds and intact distal pulses.   Pulmonary/Chest: Effort normal and breath sounds normal.  Neurological: He is alert and oriented to person, place, and time.  Skin: Skin is warm and dry.  Psychiatric: He has a normal mood and affect. His behavior is normal. Judgment and thought content normal.       Assessment & Plan:   Problem List Items Addressed This Visit      Respiratory   Acute upper respiratory infection - Primary    Symptoms and exam consistent with acute upper respiratory  infection. Start azithromycin. Start Tessalon as needed for cough. Continue over-the-counter medications as needed for symptom relief and supportive care. Follow-up if symptoms worsen or fail to improve.      Relevant Medications   azithromycin (ZITHROMAX) 250 MG tablet   benzonatate (TESSALON) 100 MG capsule

## 2015-01-25 NOTE — Progress Notes (Signed)
Pre visit review using our clinic review tool, if applicable. No additional management support is needed unless otherwise documented below in the visit note. 

## 2015-02-04 DIAGNOSIS — K269 Duodenal ulcer, unspecified as acute or chronic, without hemorrhage or perforation: Secondary | ICD-10-CM

## 2015-02-04 DIAGNOSIS — K766 Portal hypertension: Secondary | ICD-10-CM

## 2015-02-04 HISTORY — PX: COLONOSCOPY: SHX174

## 2015-02-04 HISTORY — DX: Duodenal ulcer, unspecified as acute or chronic, without hemorrhage or perforation: K26.9

## 2015-02-04 HISTORY — DX: Portal hypertension: K76.6

## 2015-02-20 ENCOUNTER — Telehealth: Payer: Self-pay | Admitting: Family

## 2015-02-20 NOTE — Telephone Encounter (Signed)
Pt request refill for furosemide (LASIX) 40 MG tablet to be send to [facility]. Please help,he is our of this med and need this ASAP

## 2015-02-21 MED ORDER — FUROSEMIDE 40 MG PO TABS: 40.0000 mg | ORAL_TABLET | ORAL | Status: DC | PRN

## 2015-02-21 NOTE — Telephone Encounter (Signed)
Rx sent. Pt aware.  

## 2015-03-13 ENCOUNTER — Encounter: Payer: Self-pay | Admitting: Neurology

## 2015-04-27 ENCOUNTER — Telehealth: Payer: Self-pay | Admitting: Neurology

## 2015-04-27 NOTE — Telephone Encounter (Signed)
Patient called wanting to ask you some questions regarding Neuropathy. His name is Patrick Kidd [id number]. Thank you

## 2015-04-27 NOTE — Telephone Encounter (Signed)
Left message on machine for pt to return call to the office.  

## 2015-08-14 ENCOUNTER — Encounter: Payer: Self-pay | Admitting: Internal Medicine

## 2015-08-15 ENCOUNTER — Ambulatory Visit (INDEPENDENT_AMBULATORY_CARE_PROVIDER_SITE_OTHER): Payer: Medicare Other | Admitting: Neurology

## 2015-08-15 ENCOUNTER — Encounter: Payer: Self-pay | Admitting: Neurology

## 2015-08-15 ENCOUNTER — Other Ambulatory Visit: Payer: Self-pay

## 2015-08-15 VITALS — BP 130/70 | HR 105 | Ht 74.0 in | Wt 269.0 lb

## 2015-08-15 DIAGNOSIS — G629 Polyneuropathy, unspecified: Secondary | ICD-10-CM

## 2015-08-15 DIAGNOSIS — R7302 Impaired glucose tolerance (oral): Secondary | ICD-10-CM

## 2015-08-15 MED ORDER — FAMOTIDINE 20 MG PO TABS: 20.0000 mg | ORAL_TABLET | Freq: Every day | ORAL | Status: DC

## 2015-08-15 NOTE — Progress Notes (Signed)
NEUROLOGY FOLLOW UP OFFICE NOTE  Patrick Kidd [id number]  HISTORY OF PRESENT ILLNESS: Patrick Kidd is a 70 y.o. right-handed male who follows up for peripheral neuropathy.    UPDATE: Gabapentin was increased to 321m five times daily.  It helps but it takes a while for the dose to kick in.  He notes increased pain in feet and feels more off-balance.  If he is standing and closes his eyes, he needs to hold on to something.  He also has swelling in his ankles.  Reviewing labs from 11/15/14, his blood glucose was 164 and Hgb A1c was 7.1.  He does not follow a healthy diet [facility]  HISTORY: Onset of symptoms began in 2010.  He describes initially experiencing numbness on the bottom of both feet, in which recently it has spread to the ankles. It is also associated with an uncomfortable tingling sensation. It is not painful but it is uncomfortable and usually bothers him at night.  He doesn't really notice it if he is active.  He is concerned about his balance.  The first time, he was getting out of a boat and felt that his right leg was not strong enough to hold his weight and he fell to his knees. The second time, he tripped over a chair and fell down to his knees.  He felt that his leg just gave out when it paired his weight. He is particularly concerned, because he does enjoy riding on a boat and dancing.  More recently, he stood up from a chair at a game and felt shaky and unsteady.  He has not been exercising as much due to fear of imbalance.  He denies any pain radiating down the legs.  He has pain in his right big toe after an 8 pound frozen duck breast fell on it.  He has tingling in the finger tips.  Labs from 08/04/12 revealed ESR 29, ANA negative, Lyme negative.  SPEP revealed possible faint restricted band in the gamma region, IFE revealed polyclonal gammopathy involving IgA and IgM.  Prior labs last month revealed Hgb A1c 5.7, TSH 2.42, RPR  non-reactive, serum glucose 131.  CBC and CMP otherwise unremarkable.  B12 from 5/12 was 1293.  Fasting glucose was 143 and 2 hour glucose tolerance test was 341.  Labs from 01/11/13 revealed Hgb A1c of 5.8.  PAST MEDICAL HISTORY: Past Medical History  Diagnosis Date  . Gout   . Chronic cough     PMH of  . GERD (gastroesophageal reflux disease)   . Peripheral neuropathy ([place]   . Diverticulosis 07-03-2010    Colonoscopy.   .Marland KitchenHx of adenomatous colonic polyps 07-03-2010    Colonoscopy. F/U due 2017  . Plantar fasciitis     PMH of  . Hydrocele   . Acrophobia   . Nonspecific elevation of levels of transaminase or lactic acid dehydrogenase (LDH)     MEDICATIONS: Current Outpatient Prescriptions on File Prior to Visit  Medication Sig Dispense Refill  . famotidine (PEPCID) 20 MG tablet Take 1 tablet (20 mg total) by mouth at bedtime. 30 tablet 11  . furosemide (LASIX) 40 MG tablet Take 1 tablet (40 mg total) by mouth as needed. 30 tablet 5  . gabapentin (NEURONTIN) 300 MG capsule Take 1 capsule (300 mg total) by mouth 4 (four) times daily. (Patient taking differently: Take 300 mg by mouth 5 (five) times daily. ) 120 capsule 11  .  omeprazole (PRILOSEC) 20 MG capsule Take 1 capsule (20 mg total) by mouth daily. 30 capsule 11  . sildenafil (REVATIO) 20 MG tablet 2-5 qd prn 50 tablet 1  . sildenafil (VIAGRA) 100 MG tablet Take 1 tablet (100 mg total) by mouth daily as needed. 1/2-1 by mouth as needed 6 tablet 6  . simethicone (GAS-X) 80 MG chewable tablet Chew 1 tablet (80 mg total) by mouth every 6 (six) hours as needed for flatulence. 30 tablet 0  . sucralfate (CARAFATE) 1 G tablet Take 1 tablet (1 g total) by mouth 4 (four) times daily -  with meals and at bedtime. (Patient not taking: Reported on 08/15/2015) 30 tablet 0   No current facility-administered medications on file prior to visit.    ALLERGIES: No Known Allergies  FAMILY HISTORY: Family History  Problem Relation Age of Onset    . Lung cancer Mother     smoker  . Leukemia Father     Acute myelocytic  . Diabetes Neg Hx   . Stroke Neg Hx   . Heart disease Neg Hx     SOCIAL HISTORY: Social History   Social History  . Marital Status: Married    Spouse Name: N/A  . Number of Children: 2  . Years of Education: N/A   Occupational History  . Commercial real estate    Social History Main Topics  . Smoking status: Former Smoker -- 2.00 packs/day for 6 years    Types: Cigarettes    Quit date: 02/04/1971  . Smokeless tobacco: Never Used     Comment: smoked age [age], up to 2 ppd  . Alcohol Use: 12.6 oz/week    21 Shots of liquor per week     Comment: 2-3 a day  . Drug Use: No  . Sexual Activity:    Partners: Female   Other Topics Concern  . Not on file   Social History Narrative    REVIEW OF SYSTEMS: Constitutional: No fevers, chills, or sweats, no generalized fatigue, change in appetite Eyes: No visual changes, double vision, eye pain Ear, nose and throat: No hearing loss, ear pain, nasal congestion, sore throat Cardiovascular: No chest pain, palpitations Respiratory:  No shortness of breath at rest or with exertion, wheezes GastrointestinaI: No nausea, vomiting, diarrhea, abdominal pain, fecal incontinence Genitourinary:  No dysuria, urinary retention or frequency Musculoskeletal:  No neck pain, back pain Integumentary: swelling and discoloration in feet Neurological: as above Psychiatric: No depression, insomnia, anxiety Endocrine: No palpitations, fatigue, diaphoresis, mood swings, change in appetite, change in weight, increased thirst Hematologic/Lymphatic:  No purpura, petechiae. Allergic/Immunologic: no itchy/runny eyes, nasal congestion, recent allergic reactions, rashes  PHYSICAL EXAM: Filed Vitals:   08/15/15 1435  BP: 130/70  Pulse: 105   General: No acute distress.  Patient appears well-groomed.  obese body habitus. Head:  Normocephalic/atraumatic Eyes:  Fundi examined but  not visualized Neck: supple, no paraspinal tenderness, full range of motion Heart:  Regular rate and rhythm Lungs:  Clear to auscultation bilaterally Back: No paraspinal tenderness Extremities:  He demonstrates discoloration and swelling in feet and ankles Neurological Exam: alert and oriented to person, place, and time. Attention span and concentration intact, recent and remote memory intact, fund of knowledge intact.  Speech fluent and not dysarthric, language intact.  CN II-XII intact. Bulk and tone normal, muscle strength 5/5 throughout.  Sensation to pinprick reduced up to mid-ankle.  Vibration and proprioception reduced in toes.  Deep tendon reflexes 2+ throughout except absent in ankles,  toes downgoing.  Finger to nose and heel to shin testing intact.  Gait wide-based, Romberg with sway.  IMPRESSION: Polyneuropathy.  I suspect it is likely related to diabetes.  His 2 hour glucose tolerance test in 2014 was 341.  Despite normal Hgb A1c values at that time, this is indicative of diabetes.  Regardless, pre-diabetes is a cause of neuropathy and he had not taken lifestyle measures to correct his impaired glucose tolerance.  PLAN: 1. We will check fasting glucose and Hgb A1c. 2.  I recommend he follow up with Mauricio Po, NP soon to discuss measures in actively treating his glucose intolerance/diabetes.  Optimizing glycemic control is the one thing that may potentially stop progression of his neuropathy and may provide some mild improvement in current symptoms. 3.  We did discuss increase exercise and diet. 4.  As far as pain control, he will try increasing gabapentin to 656m 4 times daily.  If he cannot tolerate, then I would consider Lyrica. 5.  Follow up in one year or as needed.  25 minutes spent face to face with patient, over 50% spent discussing diagnosis and management.  AMetta Clines DO  CC:  GMauricio Po NP

## 2015-08-15 NOTE — Telephone Encounter (Signed)
Last seen 12/16. No f/u on file.

## 2015-08-15 NOTE — Patient Instructions (Signed)
I think your nerve problems is related to uncontrolled blood sugar. 1.  We will check fasting glucose and Hgb A1c 2.  Try taking gabapentin 2 pills four times daily.  If not effective or if makes you too drowsy, we can try a different medication called Lyrica. 3.  Follow up with Mauricio Po, NP regarding the uncontrolled blood sugar and possible heart burn. 4.  Follow up in one year or as needed.

## 2015-08-16 ENCOUNTER — Other Ambulatory Visit (INDEPENDENT_AMBULATORY_CARE_PROVIDER_SITE_OTHER): Payer: Medicare Other

## 2015-08-16 DIAGNOSIS — R7302 Impaired glucose tolerance (oral): Secondary | ICD-10-CM | POA: Diagnosis not present

## 2015-08-16 LAB — GLUCOSE, RANDOM: Glucose, Bld: 166 mg/dL — ABNORMAL HIGH (ref 70–99)

## 2015-08-16 LAB — HEMOGLOBIN A1C: Hgb A1c MFr Bld: 6.7 % — ABNORMAL HIGH (ref 4.6–6.5)

## 2015-08-20 ENCOUNTER — Telehealth: Payer: Self-pay

## 2015-08-20 NOTE — Telephone Encounter (Signed)
Left message on machine for pt to return call to the office.  

## 2015-08-20 NOTE — Telephone Encounter (Signed)
-----   Message from Pieter Partridge, DO sent at 08/20/2015  8:04 AM EDT ----- Labs show continued uncontrolled blood sugars.  This needs to be addressed with his primary care provider in order to get this under control, because this may be contributing to worsening neuropathy.

## 2015-08-22 ENCOUNTER — Ambulatory Visit (INDEPENDENT_AMBULATORY_CARE_PROVIDER_SITE_OTHER): Payer: Medicare Other | Admitting: Family

## 2015-08-22 ENCOUNTER — Encounter: Payer: Self-pay | Admitting: Family

## 2015-08-22 VITALS — BP 120/58 | HR 94 | Temp 98.1°F | Resp 14 | Ht 74.0 in | Wt 272.0 lb

## 2015-08-22 DIAGNOSIS — E1142 Type 2 diabetes mellitus with diabetic polyneuropathy: Secondary | ICD-10-CM | POA: Diagnosis not present

## 2015-08-22 DIAGNOSIS — E669 Obesity, unspecified: Secondary | ICD-10-CM

## 2015-08-22 DIAGNOSIS — R140 Abdominal distension (gaseous): Secondary | ICD-10-CM | POA: Diagnosis not present

## 2015-08-22 DIAGNOSIS — R21 Rash and other nonspecific skin eruption: Secondary | ICD-10-CM

## 2015-08-22 DIAGNOSIS — Z794 Long term (current) use of insulin: Secondary | ICD-10-CM

## 2015-08-22 DIAGNOSIS — E118 Type 2 diabetes mellitus with unspecified complications: Secondary | ICD-10-CM | POA: Insufficient documentation

## 2015-08-22 MED ORDER — FAMOTIDINE 20 MG PO TABS: 20.0000 mg | ORAL_TABLET | Freq: Every day | ORAL | Status: DC

## 2015-08-22 MED ORDER — METFORMIN HCL ER 500 MG PO TB24: 500.0000 mg | ORAL_TABLET | Freq: Every day | ORAL | Status: DC

## 2015-08-22 MED ORDER — CLOTRIMAZOLE-BETAMETHASONE 1-0.05 % EX CREA: 1.0000 "application " | TOPICAL_CREAM | Freq: Two times a day (BID) | CUTANEOUS | Status: DC

## 2015-08-22 NOTE — Progress Notes (Signed)
Pre visit review using our clinic review tool, if applicable. No additional management support is needed unless otherwise documented below in the visit note. 

## 2015-08-22 NOTE — Assessment & Plan Note (Signed)
Rash appears consistent with tinea corporis that is refractory to over-the-counter antifungal medication and previously prescribed Lotrisone by dermatology. Recommend follow-up with dermatology for additional assessment and possible scraping.

## 2015-08-22 NOTE — Assessment & Plan Note (Signed)
Abdominal distention refractory to famotidine and simethicone. Recommend trial of probiotic and Gaviscon. No evidence of constipation or obstruction. Avoid gaseous producing foods. If symptoms do not improve consider ultrasound or possible CT scan to rule out underlying pathology.

## 2015-08-22 NOTE — Assessment & Plan Note (Signed)
BMI of 34 indicating obesity. Recommend weight loss of 5-10% of current body weight through nutrition and physical activity changes.Recommend increasing physical activity to 30 minutes of moderate level activity daily. Encourage nutritional intake that focuses on nutrient dense foods and is moderate, varied, and balanced and is low in saturated fats and processed/sugary foods. Continue to monitor.

## 2015-08-22 NOTE — Assessment & Plan Note (Signed)
Newly diagnosed type 2 diabetes with most recent A1c of 6.7 and severe neuropathy of his bilateral lower extremities that was existent prior to diabetes diagnosis. Start metformin for blood sugar control. Encouraged to complete diabetic eye exam independently. Diabetic foot exam completed today. Declines Pneumovax. Not currently maintained on statin or Ace/ARB. Follow up in 3 months for repeat A1c. Consider possible vitamin supplementation for neuropathy and possible neurorehab for balance.

## 2015-08-22 NOTE — Patient Instructions (Signed)
Thank you for choosing [facility].  Summary/Instructions:  Please try a probiotic such as Activia, Align, or Culturelle  Start Gaviscon for the bloating.  Start metformin for your diabetes.  Start the lotrisone cream for your rash.  Your prescription(s) have been submitted to your pharmacy or been printed and provided for you. Please take as directed and contact our office if you believe you are having problem(s) with the medication(s) or have any questions.  If your symptoms worsen or fail to improve, please contact our office for further instruction, or in case of emergency go directly to the emergency room at the closest medical facility.

## 2015-08-22 NOTE — Progress Notes (Addendum)
Subjective:    Patient ID: Patrick Kidd, male    DOB: November 12, 1945, 70 y.o.   MRN: [id number]  Chief Complaint  Patient presents with  . Follow-up    following up on blood sugar and stomach issues    HPI:  Patrick Kidd is a 70 y.o. male who  has a past medical history of Gout; Chronic cough; GERD (gastroesophageal reflux disease); Peripheral neuropathy ([place]); Diverticulosis (07-03-2010); adenomatous colonic polyps (07-03-2010); Plantar fasciitis; Hydrocele; Acrophobia; and Nonspecific elevation of levels of transaminase or lactic acid dehydrogenase (LDH). and presents today for a follow up office visit.   1.) Type 2 diabetes - Recently evaluated by neurology for polyneuropathy and impaired glucose tolerance. Was found to have an A1c of 6.7 with the recommendation for follow up with primary care. Does express increased frequency of urination with no changes in vision or increased thrist. Expresses the associated symptoms of neuropathy located in his bilateral feet.  Lab Results  Component Value Date   HGBA1C 6.7* 08/16/2015    2.) Stomach issues - This is a new problem. Associated symptom of increased gas production and a soreness feeling located in his esophogus has ben going on for about 1 month. Eating makes it worse. No burning sensation. Alternates between constipation and diarrhea. No cramping, nausea, or vomiting. Severity has had to change his diet because of the discomfort.   3.) Rash - This is a new problem. Associated symptom of a rash located on his bilateral arms has been going on for several months and described as red and with clear centers. Modifying factors include a steroid cream that he received from dermatology that has not helped very much.    No Known Allergies   Current Outpatient Prescriptions on File Prior to Visit  Medication Sig Dispense Refill  . furosemide (LASIX) 40 MG tablet Take 1 tablet (40 mg total) by mouth as needed. 30 tablet 5  .  gabapentin (NEURONTIN) 300 MG capsule Take 1 capsule (300 mg total) by mouth 4 (four) times daily. (Patient taking differently: Take 300 mg by mouth 5 (five) times daily. ) 120 capsule 11  . omeprazole (PRILOSEC) 20 MG capsule Take 1 capsule (20 mg total) by mouth daily. 30 capsule 11  . simethicone (GAS-X) 80 MG chewable tablet Chew 1 tablet (80 mg total) by mouth every 6 (six) hours as needed for flatulence. 30 tablet 0  . sildenafil (REVATIO) 20 MG tablet 2-5 qd prn (Patient not taking: Reported on 08/22/2015) 50 tablet 1  . sildenafil (VIAGRA) 100 MG tablet Take 1 tablet (100 mg total) by mouth daily as needed. 1/2-1 by mouth as needed (Patient not taking: Reported on 08/22/2015) 6 tablet 6  . sucralfate (CARAFATE) 1 G tablet Take 1 tablet (1 g total) by mouth 4 (four) times daily -  with meals and at bedtime. (Patient not taking: Reported on 08/15/2015) 30 tablet 0   No current facility-administered medications on file prior to visit.     Past Surgical History  Procedure Laterality Date  . Wisdom tooth extraction    . Flexible sigmoidoscopy  2000  . Colonoscopy w/ polypectomy  07/03/2010    2 adenomas, diverticulosis on right. Dr [name]  . Cataract extraction, bilateral  12/2011     Dr Gershon Crane      Review of Systems  Constitutional: Negative for fever and chills.  Eyes:       Negative for changes in vision.  Respiratory: Negative for chest tightness and shortness  of breath.   Cardiovascular: Negative for chest pain, palpitations and leg swelling.  Gastrointestinal: Positive for abdominal distention. Negative for nausea, vomiting, abdominal pain, diarrhea, constipation, blood in stool and anal bleeding.  Endocrine: Negative for polydipsia, polyphagia and polyuria.  Skin: Positive for rash.  Neurological: Positive for numbness. Negative for dizziness, weakness, light-headedness and headaches.      Objective:    BP 120/58 mmHg  Pulse 94  Temp(Src) 98.1 F (36.7 C) (Oral)  Resp  14  Ht 6\' 2"  (1.88 m)  Wt 272 lb (123.378 kg)  BMI 34.91 kg/m2  SpO2 96% Nursing note and vital signs reviewed.  Physical Exam  Constitutional: He is oriented to person, place, and time. He appears well-developed and well-nourished. No distress.  Cardiovascular: Normal rate, regular rhythm, normal heart sounds and intact distal pulses.   Pulmonary/Chest: Effort normal and breath sounds normal.  Abdominal: Soft. Bowel sounds are normal. He exhibits distension. He exhibits no mass. There is no tenderness. There is no rebound and no guarding.  Neurological: He is alert and oriented to person, place, and time.  Diabetic Foot Exam - Simple   Simple Foot Form  Visual Inspection  No deformities, no ulcerations, no other skin breakdown bilaterally:  Yes  Sensation Testing  Pulse Check  Posterior Tibialis and Dorsalis pulse intact bilaterally:  Yes  Comments  There is severe neuropathy with minimal sensation located in the dorsal  aspect of his feet and decreased to monofilament.   Skin: Skin is warm and dry. Rash (Multiple circular lesions with red borders and clear centers sporadically spread amongst he upper extremeities) noted.  Psychiatric: He has a normal mood and affect. His behavior is normal. Judgment and thought content normal.       Assessment & Plan:   Problem List Items Addressed This Visit      Endocrine   Type 2 diabetes mellitus ([place]) - Primary    Newly diagnosed type 2 diabetes with most recent A1c of 6.7 and severe neuropathy of his bilateral lower extremities that was existent prior to diabetes diagnosis. Start metformin for blood sugar control. Encouraged to complete diabetic eye exam independently. Diabetic foot exam completed today. Declines Pneumovax. Not currently maintained on statin or Ace/ARB. Follow up in 3 months for repeat A1c. Consider possible vitamin supplementation for neuropathy and possible neurorehab for balance.       Relevant Medications   metFORMIN  (GLUCOPHAGE-XR) 500 MG 24 hr tablet     Musculoskeletal and Integument   Rash and nonspecific skin eruption    Rash appears consistent with tinea corporis that is refractory to over-the-counter antifungal medication and previously prescribed Lotrisone by dermatology. Recommend follow-up with dermatology for additional assessment and possible scraping.        Other   Obesity (BMI 30-39.9)    BMI of 34 indicating obesity. Recommend weight loss of 5-10% of current body weight through nutrition and physical activity changes.Recommend increasing physical activity to 30 minutes of moderate level activity daily. Encourage nutritional intake that focuses on nutrient dense foods and is moderate, varied, and balanced and is low in saturated fats and processed/sugary foods. Continue to monitor.      Relevant Medications   metFORMIN (GLUCOPHAGE-XR) 500 MG 24 hr tablet   Abdominal distention    Abdominal distention refractory to famotidine and simethicone. Recommend trial of probiotic and Gaviscon. No evidence of constipation or obstruction. Avoid gaseous producing foods. If symptoms do not improve consider ultrasound or possible CT scan to rule  out underlying pathology.          I have discontinued Mr. Zia's clotrimazole-betamethasone. I am also having him start on metFORMIN. Additionally, I am having him maintain his sildenafil, simethicone, omeprazole, sucralfate, gabapentin, sildenafil, furosemide, and famotidine.   Meds ordered this encounter  Medications  . famotidine (PEPCID) 20 MG tablet    Sig: Take 1 tablet (20 mg total) by mouth at bedtime.    Dispense:  30 tablet    Refill:  5  . DISCONTD: clotrimazole-betamethasone (LOTRISONE) cream    Sig: Apply 1 application topically 2 (two) times daily.    Dispense:  30 g    Refill:  0    Order Specific Question:  Supervising Provider    Answer:  Pricilla Holm A [id number]  . metFORMIN (GLUCOPHAGE-XR) 500 MG 24 hr tablet    Sig: Take  1 tablet (500 mg total) by mouth daily with breakfast.    Dispense:  30 tablet    Refill:  1    Order Specific Question:  Supervising Provider    Answer:  Pricilla Holm A [id number]     Follow-up: Return in about 1 month (around 09/22/2015).  Mauricio Po, FNP

## 2015-08-24 ENCOUNTER — Other Ambulatory Visit: Payer: Self-pay | Admitting: Family

## 2015-08-24 DIAGNOSIS — G6289 Other specified polyneuropathies: Secondary | ICD-10-CM

## 2015-08-24 DIAGNOSIS — R2689 Other abnormalities of gait and mobility: Secondary | ICD-10-CM

## 2015-09-03 NOTE — Progress Notes (Addendum)
Subjective:   Patrick Kidd is a 70 y.o. male who presents for Medicare Annual/Subsequent preventive examination.  Review of Systems:  HRA assessment completed during this visit with   The Patient was informed that the wellness visit is to identify future health risk and educate and initiate measures that can reduce risk for increased disease through the lifespan.    If he lives to be 89, he will have to do things differently Feet  Neuropathy;  Stops his function; golf and dance  DM - on the fence until recently; Also stomach; GERDS medicine is not helping Had blackeyed peas and steak;  Sob last 3 hours; no chest pain but had diaphoresis; nausea but no vomiting;  Feels like x 2 years he can't get air in his lungs; especially at hs Walks to the mailbox;   Also has round circular rash on arms; x 15 months; dermatologist; Dr. Ronnald Ramp; states it is chemical imbalance    NO ROS; Medicare Wellness Visit Last OV:  08/2015  Labs completed:   Psychosocial: (mother lung cancer; father had leukemia) Former smoker; quit 02/1971  12 pack years/  ETOH 2 to 3 per day;  A bunch; 3 to 4 a night; vodka;   Medications reviewed for issues; compliance; otc meds Does not take due to going to the bathroom;  Needs increase in gabapentin; taking 1500 per day and not 1200  BMI: 34: Per Terri Piedra:  Recommend weight loss of 5-10% of current body weight through nutrition and physical activity changes.Recommend increasing physical activity to 30 minutes of moderate level activity daily. Fall; stumbles; wife states he has fallen x 1 over threshold   Diet;  no special diet  Breakfast; fruit and yogurt Lunch; sandwich; or does not eat lunch Supper; meat, vegetables;  Grilled or baked Bad about sweets; ice cream;   Exercise;   Wants to get a boat;   Shagging is out; golf;  Going outpatient PT;   Typical day;  Does not get up  Still has sense of humor;    HOME SAFETY front door steps One  level in home; can't walk in a straight line or sob; Age in place; may be out;   Personal safety issues reviewed for risk such as safe community; smoke Secondary school teacher; firearms safety if applicable; protection when in the sun; driving safety for seniors or any recent accidents.  Fall hx; one fall per your wife;  Fear of falling? Yes;  UA or BOWEL incontinence;  Functional losses from last year to this year?  Given education on "Fall Prevention in the Home" for more safety tips the patient can apply as appropriate.   Risk for Depression reviewed: Any emotional problems? Anxious, depressed, irritable, sad or blue? no Denies feeling depressed or hopeless; voices pleasure in daily life How many social activities have you been engaged in within the last 2 weeks? no  Sleep/ sleep x 4 and 6 hour  cpap was reviewed last year;   Cognitive; no Manages checkbook, medications; no failures of task Ad8 score reviewed for issues;  Issues making decisions; no  Less interest in hobbies / activities" no  Repeats questions, stories; family complaining: NO  Trouble using ordinary gadgets; microwave; computer: no  Forgets the month or year: no  Mismanaging finances: no  Missing apt: no but does write them down  Daily problems with thinking of memory NO Ad8 score is 0   Advanced Directive addressed; Completed or educated   Counseling Health Maintenance Gaps:  Hep c /at your blood next  ua microalbumin Colonoscopy; 07/1998 / will have on in 10/2015;  EKG: 02/2014 Prostate cancer screening: 7.5 Sees urology; Dr. Rogue Bussing, [place];    Hearing: has never had hearing checked   Ophthalmology exam after cataract surgery; Dr. Celso Sickle     Immunizations Due: (Vaccines reviewed and educated regarding any overdue)    Established and updated Risk reviewed and appropriate referral made or health recommendations:  Current Care Team reviewed and updated    Cardiac Risk Factors include: advanced age  (>62men, >47 women);male gender;dyslipidemia;obesity (BMI >30kg/m2)     Objective:    Vitals: BP 120/60   Pulse (!) 111   Ht 6\' 2"  (1.88 m)   Wt 264 lb (119.7 kg)   SpO2 98%   BMI 33.90 kg/m   Body mass index is 33.9 kg/m.  Tobacco History  Smoking Status  . Former Smoker  . Packs/day: 2.00  . Years: 6.00  . Types: Cigarettes  . Quit date: 02/04/1971  Smokeless Tobacco  . Never Used    Comment: smoked age [age], up to 2 ppd     Counseling given: Not Answered   Past Medical History:  Diagnosis Date  . Acrophobia   . Chronic cough    PMH of  . Diverticulosis 07-03-2010   Colonoscopy.   Marland Kitchen GERD (gastroesophageal reflux disease)   . Gout   . Hx of adenomatous colonic polyps 07-03-2010   Colonoscopy. F/U due 2017  . Hydrocele   . Nonspecific elevation of levels of transaminase or lactic acid dehydrogenase (LDH)   . Peripheral neuropathy ([place])   . Plantar fasciitis    PMH of   Past Surgical History:  Procedure Laterality Date  . CATARACT EXTRACTION, BILATERAL  12/2011    Dr Gershon Crane  . COLONOSCOPY W/ POLYPECTOMY  07/03/2010   2 adenomas, diverticulosis on right. Dr [name]  . FLEXIBLE SIGMOIDOSCOPY  2000  . WISDOM TOOTH EXTRACTION     Family History  Problem Relation Age of Onset  . Lung cancer Mother     smoker  . Leukemia Father     Acute myelocytic  . Diabetes Neg Hx   . Stroke Neg Hx   . Heart disease Neg Hx    History  Sexual Activity  . Sexual activity: Yes  . Partners: Female    Outpatient Encounter Prescriptions as of 09/04/2015  Medication Sig  . furosemide (LASIX) 40 MG tablet Take 1 tablet (40 mg total) by mouth as needed.  . gabapentin (NEURONTIN) 300 MG capsule Take 1 capsule (300 mg total) by mouth 4 (four) times daily. (Patient taking differently: Take 300 mg by mouth 5 (five) times daily. )  . omeprazole (PRILOSEC) 20 MG capsule Take 1 capsule (20 mg total) by mouth daily.  . sildenafil (REVATIO) 20 MG tablet 2-5 qd prn  . sildenafil  (VIAGRA) 100 MG tablet Take 1 tablet (100 mg total) by mouth daily as needed. 1/2-1 by mouth as needed  . sucralfate (CARAFATE) 1 G tablet Take 1 tablet (1 g total) by mouth 4 (four) times daily -  with meals and at bedtime.  . famotidine (PEPCID) 20 MG tablet Take 1 tablet (20 mg total) by mouth at bedtime. (Patient not taking: Reported on 09/04/2015)  . metFORMIN (GLUCOPHAGE-XR) 500 MG 24 hr tablet Take 1 tablet (500 mg total) by mouth daily with breakfast.  . simethicone (GAS-X) 80 MG chewable tablet Chew 1 tablet (80 mg total) by mouth every 6 (six) hours as  needed for flatulence. (Patient not taking: Reported on 09/04/2015)   No facility-administered encounter medications on file as of 09/04/2015.     Activities of Daily Living In your present state of health, do you have any difficulty performing the following activities: 09/04/2015  Hearing? N  Vision? N  Difficulty concentrating or making decisions? N  Walking or climbing stairs? Y  Dressing or bathing? N  Doing errands, shopping? N  Preparing Food and eating ? N  Using the Toilet? N  In the past six months, have you accidently leaked urine? N  Do you have problems with loss of bowel control? Y  Managing your Medications? N  Managing your Finances? N  Housekeeping or managing your Housekeeping? N  Some recent data might be hidden    Patient Care Team: [name], FNP as PCP - General (Family Medicine)   Assessment:    State he is sitting up at hs Feels like his discomfort is GI related; Stated at risk due to Diabetes and neuropathy; Sometimes has atypical chest pain.   Also to schedule apt with Marya Amsler to evaluate recent increase in SOB;   Will make apt with Dr. [name] today for acute fup of new symptoms;  Did discuss hold ETOH at hs in lieu of symptoms; as well as eating no later than 7pm   Exercise Activities and Dietary recommendations Current Exercise Habits: Home exercise routine, Type of exercise: walking, Time  (Minutes): 15, Frequency (Times/Week): 5, Weekly Exercise (Minutes/Week): 75, Intensity: Mild  Goals    . patient          Wants to feel better       Fall Risk Fall Risk  09/04/2015 08/22/2015 08/15/2015 03/07/2014 07/16/2012  Falls in the past year? No Yes Yes Yes Yes  Number falls in past yr: - 1 1 1 1   Injury with Fall? - No No No Yes  Follow up - - Falls evaluation completed - -   Depression Screen PHQ 2/9 Scores 09/04/2015 03/07/2014 07/16/2012  PHQ - 2 Score 0 0 0    Cognitive Testing MMSE - Mini Mental State Exam 09/04/2015  Not completed: (No Data)   Ad8 score 0  Immunization History  Administered Date(s) Administered  . Pneumococcal Polysaccharide-23 06/05/2011  . Tdap 06/03/2009   Screening Tests Health Maintenance  Topic Date Due  . Hepatitis C Screening  Mar 15, 1945  . OPHTHALMOLOGY EXAM  10/20/1954  . URINE MICROALBUMIN  07/19/2014  . COLONOSCOPY  07/03/2015  . INFLUENZA VACCINE  09/04/2015  . ZOSTAVAX  08/21/2016 (Originally 10/19/2004)  . PNA vac Low Risk Adult (2 of 2 - PCV13) 08/21/2016 (Originally 06/04/2012)  . HEMOGLOBIN A1C  02/16/2016  . FOOT EXAM  08/21/2016  . TETANUS/TDAP  06/04/2019      Plan:     C/o of episodes of sob over the last 2 days at hs; states he ate supper on Monday; breathing difficulty lasted x 3 hours at hs; describes his breath as rapid and short; also c/o he has to stop and catch his breath coming from the mailbox.  Had episode Monday night as well,  didn't last as long; C/o of no chest pain, but did have diaphoresis and nausea;  States he does not want to feel like this tonight. Alerted to go to ER if he became uncomfortable and sob;   Is planning on seeing Dr. [name] for colonoscopy and would like apt for fup to GERDS.first.  Did try Gaviscon and did feel better.  Please fup with Dr. [name] today for an acute apt in lieu for new issues   Please go to the ER for rapid SOB to r/o any heart related issues;    During the course of  the visit the patient was educated and counseled about the following appropriate screening and preventive services:   Vaccines to include Pneumoccal, Influenza, Hepatitis B, Td, Zostavax, HCV  Agreed to take Prenvar 13 today   Electrocardiogram- deferred   Cardiovascular Disease; reviewed BP good;   Colorectal cancer screening- to have in Sept 2017  Diabetes screening./ ongoing   Prostate Cancer Screening/ elevated but sees Dr. Matilde Sprang [facility]  Glaucoma screening/ Dr Celso Sickle on a regular basis   Nutrition counseling / gerd education continued;   Smoking cessation counseling n/a  Patient Instructions (the written plan) was given to the patient.    [id number], RN  09/04/2015  Medical screening examination/treatment/procedure(s) were performed by non-physician practitioner and as supervising provider I was immediately available for consultation/collaboration. I agree with above. Mauricio Po, FNP

## 2015-09-04 ENCOUNTER — Telehealth: Payer: Self-pay | Admitting: Internal Medicine

## 2015-09-04 ENCOUNTER — Ambulatory Visit (INDEPENDENT_AMBULATORY_CARE_PROVIDER_SITE_OTHER): Payer: Medicare Other

## 2015-09-04 VITALS — BP 120/60 | HR 111 | Ht 74.0 in | Wt 264.0 lb

## 2015-09-04 DIAGNOSIS — Z23 Encounter for immunization: Secondary | ICD-10-CM | POA: Diagnosis not present

## 2015-09-04 DIAGNOSIS — Z729 Problem related to lifestyle, unspecified: Secondary | ICD-10-CM | POA: Diagnosis not present

## 2015-09-04 DIAGNOSIS — Z0000 Encounter for general adult medical examination without abnormal findings: Secondary | ICD-10-CM | POA: Diagnosis not present

## 2015-09-04 NOTE — Patient Instructions (Addendum)
[name] , Thank you for taking time to come for your Medicare Wellness Visit. I appreciate your ongoing commitment to your health goals. Please review the following plan we discussed and let me know if I can assist you in the future.   C/o of episodes of sob over the last 2 days at hs; states he ate supper on Monday; breathing difficulty lasted x 3 hours; Also had episode last pm; didn't last as long; C/o of no chest pain, but does have diaphoresis and nausea;  States he does not want to feel like this tonight.  Is planning on seeing Dr. [name] for colonoscopy and would like apt for fup to GERDS. Did try Gaviscon.  Please fup with Dr. [name] today for apt.  Please go to the ER for rapid SOB to r/o any heart related issues   These are the goals we discussed: Goals    . patient          Wants to feel better        This is a list of the screening recommended for you and due dates:  Health Maintenance  Topic Date Due  .  Hepatitis C: One time screening is recommended by Center for Disease Control  (CDC) for  adults born from [birth year] through 1966.   [birth year]  . Eye exam for diabetics  10[birth year]20/1954  . Urine Protein Check  07/19/2014  . Colon Cancer Screening  07/03/2015  . Flu Shot  09/04/2015  . Shingles Vaccine  08/21/2016*  . Pneumonia vaccines (2 of 2 - PCV13) 08/21/2016*  . Hemoglobin A1C  02/16/2016  . Complete foot exam   08/21/2016  . Tetanus Vaccine  06/04/2019  *Topic was postponed. The date shown is not the original due date.      Fall Prevention in the Home  Falls can cause injuries. They can happen to people of all ages. There are many things you can do to make your home safe and to help prevent falls.  WHAT CAN I DO ON THE OUTSIDE OF MY HOME?  Regularly fix the edges of walkways and driveways and fix any cracks.  Remove anything that might make you trip as you walk through a door, such as a raised step or threshold.  Trim any bushes or trees on the path  to your home.  Use bright outdoor lighting.  Clear any walking paths of anything that might make someone trip, such as rocks or tools.  Regularly check to see if handrails are loose or broken. Make sure that both sides of any steps have handrails.  Any raised decks and porches should have guardrails on the edges.  Have any leaves, snow, or ice cleared regularly.  Use sand or salt on walking paths during winter.  Clean up any spills in your garage right away. This includes oil or grease spills. WHAT CAN I DO IN THE BATHROOM?   Use night lights.  Install grab bars by the toilet and in the tub and shower. Do not use towel bars as grab bars.  Use non-skid mats or decals in the tub or shower.  If you need to sit down in the shower, use a plastic, non-slip stool.  Keep the floor dry. Clean up any water that spills on the floor as soon as it happens.  Remove soap buildup in the tub or shower regularly.  Attach bath mats securely with double-sided non-slip rug tape.  Do not have throw rugs and other things on the  floor that can make you trip. WHAT CAN I DO IN THE BEDROOM?  Use night lights.  Make sure that you have a light by your bed that is easy to reach.  Do not use any sheets or blankets that are too big for your bed. They should not hang down onto the floor.  Have a firm chair that has side arms. You can use this for support while you get dressed.  Do not have throw rugs and other things on the floor that can make you trip. WHAT CAN I DO IN THE KITCHEN?  Clean up any spills right away.  Avoid walking on wet floors.  Keep items that you use a lot in easy-to-reach places.  If you need to reach something above you, use a strong step stool that has a grab bar.  Keep electrical cords out of the way.  Do not use floor polish or wax that makes floors slippery. If you must use wax, use non-skid floor wax.  Do not have throw rugs and other things on the floor that can  make you trip. WHAT CAN I DO WITH MY STAIRS?  Do not leave any items on the stairs.  Make sure that there are handrails on both sides of the stairs and use them. Fix handrails that are broken or loose. Make sure that handrails are as long as the stairways.  Check any carpeting to make sure that it is firmly attached to the stairs. Fix any carpet that is loose or worn.  Avoid having throw rugs at the top or bottom of the stairs. If you do have throw rugs, attach them to the floor with carpet tape.  Make sure that you have a light switch at the top of the stairs and the bottom of the stairs. If you do not have them, ask someone to add them for you. WHAT ELSE CAN I DO TO HELP PREVENT FALLS?  Wear shoes that:  Do not have high heels.  Have rubber bottoms.  Are comfortable and fit you well.  Are closed at the toe. Do not wear sandals.  If you use a stepladder:  Make sure that it is fully opened. Do not climb a closed stepladder.  Make sure that both sides of the stepladder are locked into place.  Ask someone to hold it for you, if possible.  Clearly mark and make sure that you can see:  Any grab bars or handrails.  First and last steps.  Where the edge of each step is.  Use tools that help you move around (mobility aids) if they are needed. These include:  Canes.  Walkers.  Scooters.  Crutches.  Turn on the lights when you go into a dark area. Replace any light bulbs as soon as they burn out.  Set up your furniture so you have a clear path. Avoid moving your furniture around.  If any of your floors are uneven, fix them.  If there are any pets around you, be aware of where they are.  Review your medicines with your doctor. Some medicines can make you feel dizzy. This can increase your chance of falling. Ask your doctor what other things that you can do to help prevent falls.   This information is not intended to replace advice given to you by your health care  provider. Make sure you discuss any questions you have with your health care provider.   Document Released: 11/16/2008 Document Revised: 06/06/2014 Document Reviewed: 02/24/2014 Elsevier Interactive  Patient Education 2016 [place] Maintenance, Male A healthy lifestyle and preventative care can promote health and wellness.  Maintain regular health, dental, and eye exams.  Eat a healthy diet. Foods like vegetables, fruits, whole grains, low-fat dairy products, and lean protein foods contain the nutrients you need and are low in calories. Decrease your intake of foods high in solid fats, added sugars, and salt. Get information about a proper diet from your health care provider, if necessary.  Regular physical exercise is one of the most important things you can do for your health. Most adults should get at least 150 minutes of moderate-intensity exercise (any activity that increases your heart rate and causes you to sweat) each week. In addition, most adults need muscle-strengthening exercises on 2 or more days a week.   Maintain a healthy weight. The body mass index (BMI) is a screening tool to identify possible weight problems. It provides an estimate of body fat based on height and weight. Your health care provider can find your BMI and can help you achieve or maintain a healthy weight. For males 20 years and older:  A BMI below 18.5 is considered underweight.  A BMI of 18.5 to 24.9 is normal.  A BMI of 25 to 29.9 is considered overweight.  A BMI of 30 and above is considered obese.  Maintain normal blood lipids and cholesterol by exercising and minimizing your intake of saturated fat. Eat a balanced diet with plenty of fruits and vegetables. Blood tests for lipids and cholesterol should begin at age 76 and be repeated every 5 years. If your lipid or cholesterol levels are high, you are over age 57, or you are at high risk for heart disease, you may need your cholesterol levels  checked more frequently.Ongoing high lipid and cholesterol levels should be treated with medicines if diet and exercise are not working.  If you smoke, find out from your health care provider how to quit. If you do not use tobacco, do not start.  Lung cancer screening is recommended for adults aged 23-80 years who are at high risk for developing lung cancer because of a history of smoking. A yearly low-dose CT scan of the lungs is recommended for people who have at least a 30-pack-year history of smoking and are current smokers or have quit within the past 15 years. A pack year of smoking is smoking an average of 1 pack of cigarettes a day for 1 year (for example, a 30-pack-year history of smoking could mean smoking 1 pack a day for 30 years or 2 packs a day for 15 years). Yearly screening should continue until the smoker has stopped smoking for at least 15 years. Yearly screening should be stopped for people who develop a health problem that would prevent them from having lung cancer treatment.  If you choose to drink alcohol, do not have more than 2 drinks per day. One drink is considered to be 12 oz (360 mL) of beer, 5 oz (150 mL) of wine, or 1.5 oz (45 mL) of liquor.  Avoid the use of street drugs. Do not share needles with anyone. Ask for help if you need support or instructions about stopping the use of drugs.  High blood pressure causes heart disease and increases the risk of stroke. High blood pressure is more likely to develop in:  People who have blood pressure in the end of the normal range (100-139/85-89 mm Hg).  People who are overweight or obese.  People who are African American.  If you are 34-68 years of age, have your blood pressure checked every 3-5 years. If you are 57 years of age or older, have your blood pressure checked every year. You should have your blood pressure measured twice--once when you are at a hospital or clinic, and once when you are not at a hospital or clinic.  Record the average of the two measurements. To check your blood pressure when you are not at a hospital or clinic, you can use:  An automated blood pressure machine at a pharmacy.  A home blood pressure monitor.  If you are 28-25 years old, ask your health care provider if you should take aspirin to prevent heart disease.  Diabetes screening involves taking a blood sample to check your fasting blood sugar level. This should be done once every 3 years after age 37 if you are at a normal weight and without risk factors for diabetes. Testing should be considered at a younger age or be carried out more frequently if you are overweight and have at least 1 risk factor for diabetes.  Colorectal cancer can be detected and often prevented. Most routine colorectal cancer screening begins at the age of 47 and continues through age 34. However, your health care provider may recommend screening at an earlier age if you have risk factors for colon cancer. On a yearly basis, your health care provider may provide home test kits to check for hidden blood in the stool. A small camera at the end of a tube may be used to directly examine the colon (sigmoidoscopy or colonoscopy) to detect the earliest forms of colorectal cancer. Talk to your health care provider about this at age 3 when routine screening begins. A direct exam of the colon should be repeated every 5-10 years through age 6, unless early forms of precancerous polyps or small growths are found.  People who are at an increased risk for hepatitis B should be screened for this virus. You are considered at high risk for hepatitis B if:  You were born in a country where hepatitis B occurs often. Talk with your health care provider about which countries are considered high risk.  Your parents were born in a high-risk country and you have not received a shot to protect against hepatitis B (hepatitis B vaccine).  You have HIV or AIDS.  You use needles to  inject street drugs.  You live with, or have sex with, someone who has hepatitis B.  You are a man who has sex with other men (MSM).  You get hemodialysis treatment.  You take certain medicines for conditions like cancer, organ transplantation, and autoimmune conditions.  Hepatitis C blood testing is recommended for all people born from [birth year] through 1966 and any individual with known risk factors for hepatitis C.  Healthy men should no longer receive prostate-specific antigen (PSA) blood tests as part of routine cancer screening. Talk to your health care provider about prostate cancer screening.  Testicular cancer screening is not recommended for adolescents or adult males who have no symptoms. Screening includes self-exam, a health care provider exam, and other screening tests. Consult with your health care provider about any symptoms you have or any concerns you have about testicular cancer.  Practice safe sex. Use condoms and avoid high-risk sexual practices to reduce the spread of sexually transmitted infections (STIs).  You should be screened for STIs, including gonorrhea and chlamydia if:  You are sexually active and are  younger than 24 years.  You are older than 24 years, and your health care provider tells you that you are at risk for this type of infection.  Your sexual activity has changed since you were last screened, and you are at an increased risk for chlamydia or gonorrhea. Ask your health care provider if you are at risk.  If you are at risk of being infected with HIV, it is recommended that you take a prescription medicine daily to prevent HIV infection. This is called pre-exposure prophylaxis (PrEP). You are considered at risk if:  You are a man who has sex with other men (MSM).  You are a heterosexual man who is sexually active with multiple partners.  You take drugs by injection.  You are sexually active with a partner who has HIV.  Talk with your health care  provider about whether you are at high risk of being infected with HIV. If you choose to begin PrEP, you should first be tested for HIV. You should then be tested every 3 months for as long as you are taking PrEP.  Use sunscreen. Apply sunscreen liberally and repeatedly throughout the day. You should seek shade when your shadow is shorter than you. Protect yourself by wearing long sleeves, pants, a wide-brimmed hat, and sunglasses year round whenever you are outdoors.  Tell your health care provider of new moles or changes in moles, especially if there is a change in shape or color. Also, tell your health care provider if a mole is larger than the size of a pencil eraser.  A one-time screening for abdominal aortic aneurysm (AAA) and surgical repair of large AAAs by ultrasound is recommended for men aged 44-75 years who are current or former smokers.  Stay current with your vaccines (immunizations).   This information is not intended to replace advice given to you by your health care provider. Make sure you discuss any questions you have with your health care provider.   Document Released: 07/19/2007 Document Revised: 02/10/2014 Document Reviewed: 06/17/2010 Elsevier Interactive Patient Education [employer].

## 2015-09-05 ENCOUNTER — Encounter: Payer: Self-pay | Admitting: Physical Therapy

## 2015-09-05 ENCOUNTER — Ambulatory Visit: Payer: Medicare Other | Attending: Family | Admitting: Physical Therapy

## 2015-09-05 DIAGNOSIS — R208 Other disturbances of skin sensation: Secondary | ICD-10-CM

## 2015-09-05 DIAGNOSIS — M6281 Muscle weakness (generalized): Secondary | ICD-10-CM | POA: Insufficient documentation

## 2015-09-05 DIAGNOSIS — R2681 Unsteadiness on feet: Secondary | ICD-10-CM | POA: Diagnosis not present

## 2015-09-05 DIAGNOSIS — R2689 Other abnormalities of gait and mobility: Secondary | ICD-10-CM | POA: Diagnosis not present

## 2015-09-05 NOTE — Therapy (Signed)
[facility] [address] [place], [place], [address] Phone: [phone]   Fax:  [phone]  Physical Therapy Evaluation  Patient Details  Name: Patrick Kidd MRN: [id number] Date of Birth: [birth year] Referring Provider: Mauricio Po, FNP  Encounter Date: 09/05/2015      PT End of Session - 09/05/15 2049    Visit Number 1   Number of Visits 9  eval + 8 sessions   Date for PT Re-Evaluation 10/05/15   Authorization Type Medicare primary; BCBS secondary   Authorization Time Period G Codes required   PT Start Time 1320  Pt arrived late to evaluation   PT Stop Time 1400   PT Time Calculation (min) 40 min   Activity Tolerance Patient limited by fatigue   Behavior During Therapy [facility] for tasks assessed/performed      Past Medical History:  Diagnosis Date  . Acrophobia   . Chronic cough    PMH of  . Diverticulosis 07-03-2010   Colonoscopy.   Marland Kitchen GERD (gastroesophageal reflux disease)   . Gout   . Hx of adenomatous colonic polyps 07-03-2010   Colonoscopy. F/U due 2017  . Hydrocele   . Nonspecific elevation of levels of transaminase or lactic acid dehydrogenase (LDH)   . Peripheral neuropathy ([place])   . Plantar fasciitis    PMH of    Past Surgical History:  Procedure Laterality Date  . CATARACT EXTRACTION, BILATERAL  12/2011    Dr Gershon Crane  . COLONOSCOPY W/ POLYPECTOMY  07/03/2010   2 adenomas, diverticulosis on right. Dr [name]  . FLEXIBLE SIGMOIDOSCOPY  2000  . WISDOM TOOTH EXTRACTION      There were no vitals filed for this visit.       Subjective Assessment - 09/05/15 1325    Subjective "I'm having trouble with my balance and stamina. I started having trouble with balance about 5 years ago because of neuropathy. I had PT for balance a few years ago..it helped a little bit." Pt reports increased shortness of breath. "   Pertinent History Likes to be called "[name]".  PMH significant for: GERD, DM II,  peripheral neuropathy, gout, chronic cough, diverticulosis, plantar fasciiti, hydrocele, acrophobia   Patient Stated Goals "I want to walk straight."   Currently in Pain? No/denies            [facility] PT Assessment - 09/05/15 0001      Assessment   Medical Diagnosis Other polyneuropathy, balance problem   Referring Provider Mauricio Po, FNP   Onset Date/Surgical Date 09/04/10     Precautions   Precautions Fall     Restrictions   Weight Bearing Restrictions No     Balance Screen   Has the patient fallen in the past 6 months No   Has the patient had a decrease in activity level because of a fear of falling?  No   Is the patient reluctant to leave their home because of a fear of falling?  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, Publishing copy in retirement homes   Leisure used to like to play golf     Cognition   Overall Cognitive Status Within Functional Limits for tasks assessed     Sensation   Light Touch Impaired Detail   Light Touch Impaired Details Impaired RLE;Impaired LLE   Proprioception Impaired Detail   Proprioception Impaired Details Impaired RLE;Impaired LLE   Additional Comments Light touch diminished on BLE's (stocking distribution) due to peripheral neuropathy x5 years     Coordination   Gross Motor Movements are Fluid and Coordinated Yes     ROM / Strength   AROM / PROM / Strength Strength     Strength   Overall Strength Deficits   Overall Strength Comments hip flexion 4-/5 on L, 4/5 on R; R hip ABD 4/5, R hip extension 4/5, L hip  ABD 3-/5, L hip extension 4-/5; B ankle DF 4/5, PF 4/5 on R.     Flexibility   Soft Tissue Assessment /Muscle Length yes  decreased extensibility of B gastrocnemius muscles   Hamstrings decreased extensibility in B LE's     Bed Mobility   Bed Mobility Supine to Sit;Sit to Supine;Rolling Right;Rolling Left   Rolling Right 6: Modified independent (Device/Increase time)   Rolling Left 6: Modified independent (Device/Increase time)   Supine to Sit 6: Modified independent (Device/Increase time)   Sit to Supine 6: Modified independent (Device/Increase time)     Transfers   Transfers Sit to Stand;Stand to Sit   Sit to Stand 6: Modified independent (Device/Increase time)   Stand to Sit 6: Modified independent (Device/Increase time)   Comments Able to stand from standard chair without UE use.     Ambulation/Gait   Ambulation/Gait Yes   Ambulation/Gait Assistance 6: Modified independent (Device/Increase time);5: Supervision   Ambulation/Gait Assistance Details Mod I for initial 2.5 minutes of 6MWT; (S) for remainder of 6MWT due to fatigue, increased gait instability.   Ambulation Distance (Feet) 638 Feet  during 6MWT   Assistive device None   Gait Pattern Step-through pattern;Decreased stride length;Decreased dorsiflexion - left;Right foot flat;Left foot flat;Trendelenburg;Narrow base of support   Ambulation Surface Level;Indoor     6 Minute Walk- Baseline   6 Minute Walk- Baseline yes   BP (mmHg) 136/61   HR (bpm) 105   02 Sat (%RA) 95 %   Modified Borg Scale for Dyspnea 2- Mild shortness of breath  slightly SOB from MMT just prior   Perceived Rate of Exertion (Borg) 7- Very, very light  due to MMT just prior     6 Minute walk- Post Test   6 Minute Walk Post Test yes   BP (mmHg) 137/62   HR (bpm) 117   02 Sat (%RA) 96 %   Modified Borg Scale for Dyspnea 9- Extremely severe   Perceived Rate of Exertion (Borg) 17- Very hard     6 minute walk test results    Aerobic Endurance  Distance Walked 638   Endurance additional comments Seated rest break x60 seconds required after ambulating x2.5 consecutive minutes (348'). Pt required second seated rest break from 5:20 until end of test.     Balance   Balance Assessed Yes     Static Standing Balance   Static Standing - Balance Support No upper extremity supported   Static Standing - Level of Assistance 5: Stand by assistance   Static Standing Balance -  Activities  Romberg - Eyes Opened;Romberg - Eyes Closed   Static Standing - Comment/# of  Minutes Narrow BOS with EO: >20 seconds with (S) due to M/L sway. Narrow BOS with EC: >20 seconds with close (S) to min guard due to significant postural sway/instability.                           PT Education - 09/05/15 2042    Education provided Yes   Education Details Pt eval findings, goals, and POC. Initiated walking program; see Pt Instructions.   Person(s) Educated Patient   Methods Explanation;Handout   Comprehension Verbalized understanding          PT Short Term Goals - 09/05/15 2059      PT SHORT TERM GOAL #1   Title STG's = LTG's           PT Long Term Goals - 09/05/15 2059      PT LONG TERM GOAL #1   Title Pt will report consistent compliance with walking program > 3 days/week to maximize improvements in functional endurance.  (Target date: 10/03/15)     PT LONG TERM GOAL #2   Title Pt will independently perform HEP to maximize functional gains made in PT.  (10/03/15)     PT LONG TERM GOAL #3   Title Complete Berg and improve score by 5 points from baseline to indicate improved standing balance.  (10/03/15)     PT LONG TERM GOAL #4   Title Pt will improve 6MWT distance form 638' to >/= 838' to indicate significant improvement in functional endurance.  (10/03/15)     PT LONG TERM GOAL #5   Title Pt will transfer from supine on floor to seated on EOM with supervision without use of external aid to address patient-stated goal of  getting up from floor holding onto chair.  (10/03/15)     Additional Long Term Goals   Additional Long Term Goals Yes     PT LONG TERM GOAL #6   Title Pt will ambulate x300' over unlevel, paved surfaces (including curb steps and ramps) with mod I using LRAD to indicate increased safety with community mobility. (10/03/15)                Plan - 09/05/15 2050    Clinical Impression Statement Pt is a 70 y/o M referred to outpatient PT to address balance impairments associated with diabetic polyneuropathy.  PMH significant for: GERD, DM II, peripheral neuropathy, gout, chronic cough, diverticulosis, plantar fasciiti, hydrocele, acrophobia. PT evaluation reveals the following: gait impairments, BLE weakness, impaired flexibility, impaired sensation and proprioception in BLE's, balance impairments, significantly impaired functional endurance, as indicated by 6MWT distance of 91' as compared with age normative distance of 34,729'. Pt will benefit from skilled outpatient PT 2x/week for 4 weeks to address said impairments.    Rehab Potential Good   Clinical Impairments Affecting Rehab Potential Pt not agreeable to use of assistive device or DME.   PT Frequency 2x / week   PT Duration 4 weeks   PT Treatment/Interventions ADLs/Self Care Home Management;Therapeutic activities;Functional mobility training;Stair training;Gait training;DME Instruction;Therapeutic exercise;Balance training;Neuromuscular re-education;Patient/family education;Orthotic Fit/Training   PT Next Visit Plan Complete BERG. Initiate HEP (challenge somatosensory and vestibular systems). Reiterate importance of walking program; make sure pt is self-progressing walking program.   Consulted and Agree with Plan of Care Patient      Patient will benefit from skilled therapeutic intervention in order to improve the following deficits and impairments:  Abnormal gait, Decreased balance, Decreased endurance, Impaired sensation, Decreased  strength, Decreased activity tolerance, Increased edema, Impaired flexibility  Visit Diagnosis: Other abnormalities of gait and mobility - Plan: PT plan of care cert/re-cert  Unsteadiness on feet - Plan: PT plan of care cert/re-cert  Muscle weakness (generalized) - Plan: PT plan of care cert/re-cert  Other disturbances of skin sensation - Plan: PT plan of care cert/re-cert      G-Codes - 123XX123 2039    Functional Assessment Tool Used 6MWT distance = 638'  (norm for 74-71 y/o males = 1729')   Functional Limitation Mobility: Walking and moving around   Mobility: Walking and Moving Around Current Status [phone]) At least 60 percent but less than 80 percent impaired, limited or restricted   Mobility: Walking and Moving Around Goal Status [phone]) At least 40 percent but less than 60 percent impaired, limited or restricted       Problem List Patient Active Problem List   Diagnosis Date Noted  . Type 2 diabetes mellitus ([place]) 08/22/2015  . Rash and nonspecific skin eruption 08/22/2015  . Abdominal distention 08/22/2015  . Acute upper respiratory infection 01/25/2015  . Elevated PSA 11/18/2014  . Thrombocytopenia ([place]) 03/19/2014  . Hypersomnolence 03/19/2014  . Dyspnea 03/07/2014  . Plantar fasciitis of right foot 02/17/2014  . Acute bronchitis due to Streptococcus 01/06/2014  . Obesity (BMI 30-39.9) 07/19/2013  . Peripheral neuropathy ([place]) 01/11/2013  . Polyclonal gammopathy 01/11/2013  . Impaired glucose tolerance 01/11/2013  . Edema 01/11/2013  . Hx of adenomatous colonic polyps 07/16/2012  . Chronic cough 05/31/2010  . GERD 10/04/2009  . History of gout 08/03/2006  . ACROPHOBIA 07/10/2006    Billie Ruddy, PT, DPT [facility] [address] [place], [place], [address] Phone: [phone]   Fax:  [phone] 09/05/15, 9:07 PM  Name: Patrick Kidd MRN: [id number] Date of Birth: [birth year]

## 2015-09-05 NOTE — Telephone Encounter (Signed)
Patient reports that Sunday night he had a 4 hour episode of "gas pains that made me short of breath". He denies any symptoms of gas, bloating, or abdominal pain today.  He reports Sunday  that after eating steak "my stomach blew up and doubled me over for 4 hours".  Patient was evaluated for the same symptoms by Toniann Ket in July and he recommended patient try a probiotic and a low gas diet.  Patient has not tried either.  He has omeprazole on his medication list, but he states he has not been on this for months.  He will resume his omeprazole, try probiotic, and a low gas -diet.  I will mail him coupons for all and a low gas diet information. I also recommended he try FDgard when he gets one of these episodes, I have included coupons for this also.  I asked that he try these measures for a few weeks and if he does not get relief he call me back to see an APP. I did schedule him a follow up for 11/21/15 with Dr. [name] to re-establish with him.

## 2015-09-05 NOTE — Patient Instructions (Addendum)
Walking Program:  Begin walking for exercise for 2.5 minutes, 1-2 times/day, 5 days/week.   Progress your walking program by adding 1 minute to your routine each week, as tolerated. Be sure to wear good walking shoes, walk in a safe environment and only progress to your tolerance.      Remember to pace your breathing as you walk (breathe in for 3 steps, out for 3 steps).

## 2015-09-06 ENCOUNTER — Ambulatory Visit (INDEPENDENT_AMBULATORY_CARE_PROVIDER_SITE_OTHER): Payer: Medicare Other | Admitting: Family

## 2015-09-06 ENCOUNTER — Ambulatory Visit (INDEPENDENT_AMBULATORY_CARE_PROVIDER_SITE_OTHER)
Admission: RE | Admit: 2015-09-06 | Discharge: 2015-09-06 | Disposition: A | Discharge status: Home / Self Care | Payer: Medicare Other | Source: Ambulatory Visit | Attending: Family | Admitting: Family

## 2015-09-06 ENCOUNTER — Other Ambulatory Visit (INDEPENDENT_AMBULATORY_CARE_PROVIDER_SITE_OTHER): Payer: Medicare Other

## 2015-09-06 ENCOUNTER — Encounter: Payer: Self-pay | Admitting: Family

## 2015-09-06 VITALS — BP 140/70 | HR 108 | Temp 98.4°F | Ht 74.0 in | Wt 264.5 lb

## 2015-09-06 DIAGNOSIS — R1013 Epigastric pain: Secondary | ICD-10-CM | POA: Diagnosis not present

## 2015-09-06 DIAGNOSIS — K5900 Constipation, unspecified: Secondary | ICD-10-CM | POA: Diagnosis not present

## 2015-09-06 DIAGNOSIS — R21 Rash and other nonspecific skin eruption: Secondary | ICD-10-CM

## 2015-09-06 DIAGNOSIS — R140 Abdominal distension (gaseous): Secondary | ICD-10-CM

## 2015-09-06 LAB — HEPATIC FUNCTION PANEL
ALBUMIN: 3.4 g/dL — AB (ref 3.5–5.2)
ALK PHOS: 72 U/L (ref 39–117)
ALT: 16 U/L (ref 0–53)
AST: 45 U/L — ABNORMAL HIGH (ref 0–37)
BILIRUBIN DIRECT: 0.4 mg/dL — AB (ref 0.0–0.3)
TOTAL PROTEIN: 6.6 g/dL (ref 6.0–8.3)
Total Bilirubin: 1.1 mg/dL (ref 0.2–1.2)

## 2015-09-06 LAB — LIPASE: LIPASE: 40 U/L (ref 11.0–59.0)

## 2015-09-06 MED ORDER — FAMOTIDINE 20 MG PO TABS: 20.0000 mg | ORAL_TABLET | Freq: Every day | ORAL | 5 refills | Status: DC

## 2015-09-06 MED ORDER — SUCRALFATE 1 G PO TABS: 1.0000 g | ORAL_TABLET | Freq: Three times a day (TID) | ORAL | 0 refills | Status: DC

## 2015-09-06 NOTE — Progress Notes (Signed)
Pre visit review using our clinic review tool, if applicable. No additional management support is needed unless otherwise documented below in the visit note. 

## 2015-09-06 NOTE — Assessment & Plan Note (Signed)
Abdominal distention with improvement since initial onset. Differentials included reflux, peptic ulcer disease and also concerning for possible GI bleed with symptoms of changing stool color. Obtain lipase, hepatic function panel, abdominal x-rays, and abdominal CT scan. Obtain FOBT to rule out GI bleed. Start sucralfate and continue current dosage of famotidine. Follow up pending blood work and imaging results. Return precautions provided.

## 2015-09-06 NOTE — Patient Instructions (Signed)
Thank you for choosing [facility].  Summary/Instructions:  Please continue to take your medications as prescribed.  The carafate has been sent to the pharmacy.   They will call to schedule your CT scan and the dermatology appointment.   Please stop by the lab on the lower level of the building for your blood work. Your results will be released to [place] (or called to you) after review, usually within 72 hours after test completion. If any changes need to be made, you will be notified at that same time.  1. The lab is open from 7:30am to 5:30 pm Monday-Friday  2. No appointment is necessary  3. Fasting (if needed) is 6-8 hours after food and drink; black  coffee and water are okay   Please stop by radiology on the basement level of the building for your x-rays. Your results will be released to [place] (or called to you) after review, usually within 72 hours after test completion. If any treatments or changes are necessary, you will be notified at that same time.  If your symptoms worsen or fail to improve, please contact our office for further instruction, or in case of emergency go directly to the emergency room at the closest medical facility.

## 2015-09-06 NOTE — Progress Notes (Addendum)
Subjective:    Patient ID: Patrick Kidd, male    DOB: 1945/06/09, 70 y.o.   MRN: [id number]  Chief Complaint  Patient presents with  . Follow-up    HPI:  Patrick Kidd is a 70 y.o. male who  has a past medical history of Acrophobia; Chronic cough; Diverticulosis (07-03-2010); GERD (gastroesophageal reflux disease); Gout; adenomatous colonic polyps (07-03-2010); Hydrocele; Nonspecific elevation of levels of transaminase or lactic acid dehydrogenase (LDH); Peripheral neuropathy ([place]); and Plantar fasciitis. and presents today for a follow up.   This is a new problem. Associated symptom of increased gas production with severity resulting in increased chest pressure and shortness of breath has been going on for about 4 days. The original symptoms have significantly improved today but are still present.Marland Kitchen He was at physical therapy and notice to continue to have shortness of breath after about 3 minutes of activity requiring him to rest. He is also concerned because he noted a change in his stool color to a very dark coal like color.   No Known Allergies   Current Outpatient Prescriptions on File Prior to Visit  Medication Sig Dispense Refill  . furosemide (LASIX) 40 MG tablet Take 1 tablet (40 mg total) by mouth as needed. 30 tablet 5  . gabapentin (NEURONTIN) 300 MG capsule Take 1 capsule (300 mg total) by mouth 4 (four) times daily. (Patient taking differently: Take 300 mg by mouth 5 (five) times daily. ) 120 capsule 11  . metFORMIN (GLUCOPHAGE-XR) 500 MG 24 hr tablet Take 1 tablet (500 mg total) by mouth daily with breakfast. 30 tablet 1  . omeprazole (PRILOSEC) 20 MG capsule Take 1 capsule (20 mg total) by mouth daily. 30 capsule 11  . sildenafil (REVATIO) 20 MG tablet 2-5 qd prn 50 tablet 1  . simethicone (GAS-X) 80 MG chewable tablet Chew 1 tablet (80 mg total) by mouth every 6 (six) hours as needed for flatulence. 30 tablet 0   No current facility-administered medications on  file prior to visit.      Past Surgical History:  Procedure Laterality Date  . CATARACT EXTRACTION, BILATERAL  12/2011    Dr Gershon Crane  . COLONOSCOPY W/ POLYPECTOMY  07/03/2010   2 adenomas, diverticulosis on right. Dr [name]  . FLEXIBLE SIGMOIDOSCOPY  2000  . WISDOM TOOTH EXTRACTION      Past Medical History:  Diagnosis Date  . Acrophobia   . Chronic cough    PMH of  . Diverticulosis 07-03-2010   Colonoscopy.   Marland Kitchen GERD (gastroesophageal reflux disease)   . Gout   . Hx of adenomatous colonic polyps 07-03-2010   Colonoscopy. F/U due 2017  . Hydrocele   . Nonspecific elevation of levels of transaminase or lactic acid dehydrogenase (LDH)   . Peripheral neuropathy ([place])   . Plantar fasciitis    PMH of     Review of Systems  Constitutional: Negative for chills and fever.  Respiratory: Positive for shortness of breath.   Gastrointestinal: Positive for abdominal distention. Negative for abdominal pain, constipation, diarrhea, nausea and rectal pain.      Objective:    BP 140/70   Pulse (!) 108   Temp 98.4 F (36.9 C) (Oral)   Ht 6\' 2"  (1.88 m)   Wt 264 lb 8 oz (120 kg)   SpO2 98%   BMI 33.96 kg/m  Nursing note and vital signs reviewed.  Physical Exam  Constitutional: He is oriented to person, place, and time. He appears well-developed and  well-nourished. No distress.  Cardiovascular: Normal rate, regular rhythm, normal heart sounds and intact distal pulses.   Pulmonary/Chest: Effort normal and breath sounds normal.  Abdominal: Soft. Bowel sounds are normal. He exhibits distension. He exhibits no mass. There is no hepatosplenomegaly. There is tenderness in the epigastric area. There is no rigidity, no rebound, no guarding, no tenderness at McBurney's point and negative Murphy's sign.  Neurological: He is alert and oriented to person, place, and time.  Skin: Skin is warm and dry.  Psychiatric: He has a normal mood and affect. His behavior is normal. Judgment and thought  content normal.       Assessment & Plan:   Problem List Items Addressed This Visit      Musculoskeletal and Integument   Rash and nonspecific skin eruption   Relevant Orders   Ambulatory referral to Dermatology     Other   Abdominal distention - Primary    Abdominal distention with improvement since initial onset. Differentials included reflux, peptic ulcer disease and also concerning for possible GI bleed with symptoms of changing stool color. Obtain lipase, hepatic function panel, abdominal x-rays, and abdominal CT scan. Obtain FOBT to rule out GI bleed. Start sucralfate and continue current dosage of famotidine. Follow up pending blood work and imaging results. Return precautions provided.       Relevant Orders   DG Abd 2 Views (Completed)   Lipase (Completed)   Hepatic function panel (Completed)    Other Visit Diagnoses    Abdominal pain, epigastric       Relevant Orders   CT Abdomen Pelvis Wo Contrast       I am having Mr. Wilden maintain his simethicone, omeprazole, gabapentin, sildenafil, furosemide, metFORMIN, sucralfate, and famotidine.   Meds ordered this encounter  Medications  . sucralfate (CARAFATE) 1 g tablet    Sig: Take 1 tablet (1 g total) by mouth 4 (four) times daily -  with meals and at bedtime.    Dispense:  30 tablet    Refill:  0    Order Specific Question:   Supervising Provider    Answer:   Pricilla Holm A [id number]  . famotidine (PEPCID) 20 MG tablet    Sig: Take 1 tablet (20 mg total) by mouth at bedtime.    Dispense:  30 tablet    Refill:  5    Order Specific Question:   Supervising Provider    Answer:   Pricilla Holm A [id number]     Follow-up: Return in about 3 weeks (around 09/27/2015), or if symptoms worsen or fail to improve.  Mauricio Po, FNP

## 2015-09-12 ENCOUNTER — Ambulatory Visit: Payer: Medicare Other | Admitting: Physical Therapy

## 2015-09-13 ENCOUNTER — Ambulatory Visit: Payer: Medicare Other | Admitting: Physical Therapy

## 2015-09-13 DIAGNOSIS — M6281 Muscle weakness (generalized): Secondary | ICD-10-CM | POA: Diagnosis not present

## 2015-09-13 DIAGNOSIS — R208 Other disturbances of skin sensation: Secondary | ICD-10-CM

## 2015-09-13 DIAGNOSIS — R2681 Unsteadiness on feet: Secondary | ICD-10-CM | POA: Diagnosis not present

## 2015-09-13 DIAGNOSIS — R2689 Other abnormalities of gait and mobility: Secondary | ICD-10-CM | POA: Diagnosis not present

## 2015-09-13 NOTE — Patient Instructions (Signed)
Walking Program:  Continue walking for exercise for 3 minutes, 1-2 times/day, 5 days/week.   Progress your walking program by adding 1 minute to your routine each week, as tolerated. Be sure to wear good walking shoes, walk in a safe environment and only progress to your tolerance.      Remember to pace your breathing as you walk (breathe in for 3 steps, out for 3 steps).  Feet Apart (Compliant Surface) Head Motion - Eyes Closed    Stand with your back to a corner with a stable chair in front of you. Stand on 1 pillow with feet shoulder width apart. Close eyes and move head slowly: up and down 10 times; right to left 10 times; diagonally from up-right to down-left 10 times; and diagonally from up-left to down-right 10 times. Do _2__ sessions per day.   SINGLE LIMB STANCE    Stand facing your countertop with hands close to counter. Stand on one leg, attempting to hold for 10 seconds. Repeat with other leg. Practice __5__ times on each leg. Perform __2__ session per day.   Tandem Stance    Stand with counter top on one side; hand close to counter. Right foot in front of left, heel touching toe both feet "straight ahead". Balance in this position _15__ seconds. Do with left foot in front of right. Perform this exercise 4 times with each leg leading. Perform __2__ sessions per day.  Copyright  VHI. All rights reserved.

## 2015-09-13 NOTE — Therapy (Signed)
[name] [address] [place], [place], [address] Phone: [phone]   Fax:  [phone]  Physical Therapy Treatment  Patient Details  Name: Patrick Kidd MRN: [id number] Date of Birth: July 19, 1945 Referring Provider: Mauricio Po, FNP  Encounter Date: 09/13/2015      PT End of Session - 09/13/15 1721    Visit Number 2   Number of Visits 9   Date for PT Re-Evaluation 10/05/15   Authorization Type Medicare primary; BCBS secondary   Authorization Time Period G Codes required   PT Start Time 1327  Pt arrived late to session   PT Stop Time 1401   PT Time Calculation (min) 34 min   Equipment Utilized During Treatment Gait belt   Activity Tolerance Patient tolerated treatment well   Behavior During Therapy [facility] for tasks assessed/performed      Past Medical History:  Diagnosis Date  . Acrophobia   . Chronic cough    PMH of  . Diverticulosis 07-03-2010   Colonoscopy.   Marland Kitchen GERD (gastroesophageal reflux disease)   . Gout   . Hx of adenomatous colonic polyps 07-03-2010   Colonoscopy. F/U due 2017  . Hydrocele   . Nonspecific elevation of levels of transaminase or lactic acid dehydrogenase (LDH)   . Peripheral neuropathy ([place])   . Plantar fasciitis    PMH of    Past Surgical History:  Procedure Laterality Date  . CATARACT EXTRACTION, BILATERAL  12/2011    Dr Gershon Crane  . COLONOSCOPY W/ POLYPECTOMY  07/03/2010   2 adenomas, diverticulosis on right. Dr [name]  . FLEXIBLE SIGMOIDOSCOPY  2000  . WISDOM TOOTH EXTRACTION      There were no vitals filed for this visit.      Subjective Assessment - 09/13/15 1331    Subjective "I'm up to the 3 minutes on the walking program...it's better than 2 and a half."   Pertinent History Likes to be called "[name]".  PMH significant for: GERD, DM II, peripheral neuropathy, gout, chronic cough, diverticulosis, plantar fasciiti, hydrocele, acrophobia   Patient Stated  Goals "I want to walk straight."   Currently in Pain? No/denies            [facility] PT Assessment - 09/13/15 0001      Standardized Balance Assessment   Standardized Balance Assessment Berg Balance Test     Berg Balance Test   Sit to Stand Able to stand without using hands and stabilize independently   Standing Unsupported Able to stand safely 2 minutes   Sitting with Back Unsupported but Feet Supported on Floor or Stool Able to sit safely and securely 2 minutes   Stand to Sit Sits safely with minimal use of hands   Transfers Able to transfer safely, minor use of hands   Standing Unsupported with Eyes Closed Able to stand 10 seconds with supervision   Standing Ubsupported with Feet Together Able to place feet together independently and stand for 1 minute with supervision   From Standing, Reach Forward with Outstretched Arm Can reach forward >12 cm safely (5")   From Standing Position, Pick up Object from [place] to pick up shoe safely and easily   From Standing Position, Turn to Look Behind Over each Shoulder Turn sideways only but maintains balance   Turn 360 Degrees Able to turn 360 degrees safely but slowly   Standing Unsupported, Alternately Place Feet on Step/Stool Able to stand independently and safely and complete 8 steps in 20 seconds  Standing Unsupported, One Foot in Front Able to plae foot ahead of the other independently and hold 30 seconds   Standing on One Leg Able to lift leg independently and hold equal to or more than 3 seconds   Total Score 46                          Balance Exercises - 09/13/15 1720      Balance Exercises: Standing   Standing Eyes Closed Wide (BOA);Head turns;Foam/compliant surface;Other reps (comment)  1 pillow; horiz, vertical, diagonal head turns x10 each   Tandem Stance Eyes open;Intermittent upper extremity support;4 reps;15 secs   SLS Eyes open;Solid surface;Intermittent upper extremity support;10 secs;4 reps            PT Education - 09/13/15 1719    Education provided Yes   Education Details Merrilee Jansky findings, functional implications. Balance HEP initiated; see Pt Instructions.    Person(s) Educated Patient   Methods Explanation;Demonstration;Verbal cues;Handout   Comprehension Returned demonstration;Verbalized understanding          PT Short Term Goals - 09/05/15 2059      PT SHORT TERM GOAL #1   Title STG's = LTG's           PT Long Term Goals - 09/05/15 2059      PT LONG TERM GOAL #1   Title Pt will report consistent compliance with walking program > 3 days/week to maximize improvements in functional endurance.  (Target date: 10/03/15)     PT LONG TERM GOAL #2   Title Pt will independently perform HEP to maximize functional gains made in PT.  (10/03/15)     PT LONG TERM GOAL #3   Title Complete Berg and improve score by 5 points from baseline to indicate improved standing balance.  (10/03/15)     PT LONG TERM GOAL #4   Title Pt will improve 6MWT distance form 638' to >/= 838' to indicate significant improvement in functional endurance.  (10/03/15)     PT LONG TERM GOAL #5   Title Pt will transfer from supine on floor to seated on EOM with supervision without use of external aid to address patient-stated goal of getting up from floor holding onto chair.  (10/03/15)     Additional Long Term Goals   Additional Long Term Goals Yes     PT LONG TERM GOAL #6   Title Pt will ambulate x300' over unlevel, paved surfaces (including curb steps and ramps) with mod I using LRAD to indicate increased safety with community mobility. (10/03/15)                Plan - 09/13/15 1721    Clinical Impression Statement Brief session (due to late pt arrival) focused on assessing/addressing standing balance and expanding on HEP. Berg score of 46/56 suggests low fall risk, but increased pt difficulty with tandem and single limb stance. Balance HEP initiated.   Rehab Potential Good   Clinical  Impairments Affecting Rehab Potential Pt not agreeable to use of assistive device or DME.   PT Frequency 2x / week   PT Duration 4 weeks   PT Treatment/Interventions ADLs/Self Care Home Management;Therapeutic activities;Functional mobility training;Stair training;Gait training;DME Instruction;Therapeutic exercise;Balance training;Neuromuscular re-education;Patient/family education;Orthotic Fit/Training   PT Next Visit Plan Assess current HEP. Continue standing balance activities with emphasis on SLS (static > dynamic), increasing somatosensory/vestibular reliance.    Consulted and Agree with Plan of Care Patient  Patient will benefit from skilled therapeutic intervention in order to improve the following deficits and impairments:  Abnormal gait, Decreased balance, Decreased endurance, Impaired sensation, Decreased strength, Decreased activity tolerance, Increased edema, Impaired flexibility  Visit Diagnosis: Other abnormalities of gait and mobility  Unsteadiness on feet  Other disturbances of skin sensation     Problem List Patient Active Problem List   Diagnosis Date Noted  . Type 2 diabetes mellitus ([place]) 08/22/2015  . Rash and nonspecific skin eruption 08/22/2015  . Abdominal distention 08/22/2015  . Acute upper respiratory infection 01/25/2015  . Elevated PSA 11/18/2014  . Thrombocytopenia ([place]) 03/19/2014  . Hypersomnolence 03/19/2014  . Dyspnea 03/07/2014  . Plantar fasciitis of right foot 02/17/2014  . Acute bronchitis due to Streptococcus 01/06/2014  . Obesity (BMI 30-39.9) 07/19/2013  . Peripheral neuropathy ([place]) 01/11/2013  . Polyclonal gammopathy 01/11/2013  . Impaired glucose tolerance 01/11/2013  . Edema 01/11/2013  . Hx of adenomatous colonic polyps 07/16/2012  . Chronic cough 05/31/2010  . GERD 10/04/2009  . History of gout 08/03/2006  . ACROPHOBIA 07/10/2006    Billie Ruddy, PT, DPT [facility] [address]  [place], [place], [address] Phone: [phone]   Fax:  [phone] 09/13/15, 5:25 PM  Name: Patrick Kidd MRN: [id number] Date of Birth: 1945-12-11

## 2015-09-14 ENCOUNTER — Encounter: Payer: Self-pay | Admitting: Physical Therapy

## 2015-09-14 ENCOUNTER — Ambulatory Visit: Payer: Medicare Other | Admitting: Physical Therapy

## 2015-09-14 DIAGNOSIS — R2689 Other abnormalities of gait and mobility: Secondary | ICD-10-CM

## 2015-09-14 DIAGNOSIS — R2681 Unsteadiness on feet: Secondary | ICD-10-CM

## 2015-09-14 DIAGNOSIS — M6281 Muscle weakness (generalized): Secondary | ICD-10-CM

## 2015-09-14 DIAGNOSIS — R208 Other disturbances of skin sensation: Secondary | ICD-10-CM

## 2015-09-17 ENCOUNTER — Ambulatory Visit
Admission: RE | Admit: 2015-09-17 | Discharge: 2015-09-17 | Disposition: A | Discharge status: Home / Self Care | Payer: Medicare Other | Source: Ambulatory Visit | Attending: Family | Admitting: Family

## 2015-09-17 DIAGNOSIS — K7460 Unspecified cirrhosis of liver: Secondary | ICD-10-CM | POA: Diagnosis not present

## 2015-09-17 DIAGNOSIS — R1013 Epigastric pain: Secondary | ICD-10-CM

## 2015-09-17 NOTE — Therapy (Signed)
[facility] [address] [place], [place], [address] Phone: [phone]   Fax:  [phone]  Physical Therapy Treatment  Patient Details  Name: Patrick Kidd MRN: [id number] Date of Birth: 05-Jan-1946 Referring Provider: Mauricio Po, FNP  Encounter Date: 09/14/2015   09/14/15 1328  PT Visits / Re-Eval  Visit Number 3  Number of Visits 9  Date for PT Re-Evaluation 10/05/15  Authorization  Authorization Type Medicare primary; BCBS secondary  Authorization Time Period G Codes required  PT Time Calculation  PT Start Time 1325 (pt late for apt)  PT Stop Time 1400  PT Time Calculation (min) 35 min  PT - End of Session  Equipment Utilized During Treatment Gait belt  Activity Tolerance Patient tolerated treatment well  Behavior During Therapy [facility] for tasks assessed/performed     Past Medical History:  Diagnosis Date  . Acrophobia   . Chronic cough    PMH of  . Diverticulosis 07-03-2010   Colonoscopy.   Marland Kitchen GERD (gastroesophageal reflux disease)   . Gout   . Hx of adenomatous colonic polyps 07-03-2010   Colonoscopy. F/U due 2017  . Hydrocele   . Nonspecific elevation of levels of transaminase or lactic acid dehydrogenase (LDH)   . Peripheral neuropathy ([place])   . Plantar fasciitis    PMH of    Past Surgical History:  Procedure Laterality Date  . CATARACT EXTRACTION, BILATERAL  12/2011    Dr Gershon Crane  . COLONOSCOPY W/ POLYPECTOMY  07/03/2010   2 adenomas, diverticulosis on right. Dr [name]  . FLEXIBLE SIGMOIDOSCOPY  2000  . WISDOM TOOTH EXTRACTION      There were no vitals filed for this visit.    09/14/15 1328  Symptoms/Limitations  Subjective No new compaints. No falls or pain to report.   Pertinent History Likes to be called "[name]".  PMH significant for: GERD, DM II, peripheral neuropathy, gout, chronic cough, diverticulosis, plantar fasciiti, hydrocele, acrophobia  Patient Stated Goals "I want  to walk straight."  Pain Assessment  Currently in Pain? No/denies   Treatment: Reviewed all corner balance exercises issued at previous session with handout. Min guard to min assist for balance.  On blue mat in parallel bars with 2 tall cones: alternating fwd toe taps, alternating cross toe taps, alternating fwd double toe taps and alternating cross double toe taps x 10 each bil legs. Min guard assist with cues to widen base of support for improved balance, along with weight shifting.     09/17/15 0118  Balance Exercises: Standing  Rockerboard Anterior/posterior;Lateral;Head turns;EO;EC;10 reps;Intermittent UE support;20 seconds  Balance Exercises: Standing  Rebounder Limitations performed both ways on rockerboard with intermittent UE support and min to mod assist for balance: EO rocking board with emphasis on tall posture; holding board steady with EC 15 sec's x 3 reps, EO head movements up<>down , left<>right and diagonals both ways x 10 reps each.                                          PT Short Term Goals - 09/05/15 2059      PT SHORT TERM GOAL #1   Title STG's = LTG's           PT Long Term Goals - 09/05/15 2059      PT LONG TERM GOAL #1   Title Pt will report consistent compliance with  walking program > 3 days/week to maximize improvements in functional endurance.  (Target date: 10/03/15)     PT LONG TERM GOAL #2   Title Pt will independently perform HEP to maximize functional gains made in PT.  (10/03/15)     PT LONG TERM GOAL #3   Title Complete Berg and improve score by 5 points from baseline to indicate improved standing balance.  (10/03/15)     PT LONG TERM GOAL #4   Title Pt will improve 6MWT distance form 638' to >/= 838' to indicate significant improvement in functional endurance.  (10/03/15)     PT LONG TERM GOAL #5   Title Pt will transfer from supine on floor to seated on EOM with supervision without use of external aid to address patient-stated goal of  getting up from floor holding onto chair.  (10/03/15)     Additional Long Term Goals   Additional Long Term Goals Yes     PT LONG TERM GOAL #6   Title Pt will ambulate x300' over unlevel, paved surfaces (including curb steps and ramps) with mod I using LRAD to indicate increased safety with community mobility. (10/03/15)          09/14/15 1328  Plan  Clinical Impression Statement Today's session continued to address balance with emphasis on increased vestibular and somatosensory imput. Pt had no complaitns with session today. Pt is making steady progress toward goals.  Pt will benefit from skilled therapeutic intervention in order to improve on the following deficits Abnormal gait;Decreased balance;Decreased endurance;Impaired sensation;Decreased strength;Decreased activity tolerance;Increased edema;Impaired flexibility  Rehab Potential Good  Clinical Impairments Affecting Rehab Potential Pt not agreeable to use of assistive device or DME.  PT Frequency 2x / week  PT Duration 4 weeks  PT Treatment/Interventions ADLs/Self Care Home Management;Therapeutic activities;Functional mobility training;Stair training;Gait training;DME Instruction;Therapeutic exercise;Balance training;Neuromuscular re-education;Patient/family education;Orthotic Fit/Training  PT Next Visit Plan Continue standing balance activities with emphasis on SLS (static > dynamic), increasing somatosensory/vestibular reliance.   Consulted and Agree with Plan of Care Patient         Patient will benefit from skilled therapeutic intervention in order to improve the following deficits and impairments:  Abnormal gait, Decreased balance, Decreased endurance, Impaired sensation, Decreased strength, Decreased activity tolerance, Increased edema, Impaired flexibility  Visit Diagnosis: Other abnormalities of gait and mobility  Unsteadiness on feet  Other disturbances of skin sensation  Muscle weakness  (generalized)     Problem List Patient Active Problem List   Diagnosis Date Noted  . Type 2 diabetes mellitus ([place]) 08/22/2015  . Rash and nonspecific skin eruption 08/22/2015  . Abdominal distention 08/22/2015  . Acute upper respiratory infection 01/25/2015  . Elevated PSA 11/18/2014  . Thrombocytopenia ([place]) 03/19/2014  . Hypersomnolence 03/19/2014  . Dyspnea 03/07/2014  . Plantar fasciitis of right foot 02/17/2014  . Acute bronchitis due to Streptococcus 01/06/2014  . Obesity (BMI 30-39.9) 07/19/2013  . Peripheral neuropathy ([place]) 01/11/2013  . Polyclonal gammopathy 01/11/2013  . Impaired glucose tolerance 01/11/2013  . Edema 01/11/2013  . Hx of adenomatous colonic polyps 07/16/2012  . Chronic cough 05/31/2010  . GERD 10/04/2009  . History of gout 08/03/2006  . ACROPHOBIA 07/10/2006    Willow Ora, PTA, [facility] [address], [place], [place] [address] [phone] 09/17/15, 1:17 AM   Name: DREYSEN CHRISTIN MRN: [id number] Date of Birth: [birth year]

## 2015-09-17 NOTE — Progress Notes (Signed)
   09/17/15 0118  Balance Exercises: Standing  Rebounder Limitations performed both ways on rockerboard with intermittent UE support and min to mod assist for balance: EO rocking board with emphasis on tall posture; holding board steady with EC 15 sec's x 3 reps, EO head movements up<>down , left<>right and diagonals both ways x 10 reps each.

## 2015-09-19 ENCOUNTER — Ambulatory Visit: Payer: Medicare Other | Admitting: Physical Therapy

## 2015-09-19 ENCOUNTER — Telehealth: Payer: Self-pay | Admitting: Family

## 2015-09-19 NOTE — Telephone Encounter (Signed)
Voicemail regarding availability of CT results left for patient.

## 2015-09-20 ENCOUNTER — Encounter: Payer: Self-pay | Admitting: Internal Medicine

## 2015-09-20 NOTE — Telephone Encounter (Signed)
Pt called back and asked to speak to [facility] about his CT results. Thanks.

## 2015-09-20 NOTE — Telephone Encounter (Signed)
Patrick Kidd is in clinic right now and is unable to call until he is done with clinic. I made pt aware of this. He understood.

## 2015-09-20 NOTE — Telephone Encounter (Signed)
Pt request to speak to Patrick Kidd about the  CT scan, please call him

## 2015-09-20 NOTE — Telephone Encounter (Signed)
Spoke with patient on the phone regarding results. Will send message to gastroenterology for follow up.

## 2015-09-21 ENCOUNTER — Ambulatory Visit: Payer: Medicare Other | Admitting: Physical Therapy

## 2015-09-21 DIAGNOSIS — R208 Other disturbances of skin sensation: Secondary | ICD-10-CM

## 2015-09-21 DIAGNOSIS — R2689 Other abnormalities of gait and mobility: Secondary | ICD-10-CM

## 2015-09-21 DIAGNOSIS — M6281 Muscle weakness (generalized): Secondary | ICD-10-CM | POA: Diagnosis not present

## 2015-09-21 DIAGNOSIS — R2681 Unsteadiness on feet: Secondary | ICD-10-CM | POA: Diagnosis not present

## 2015-09-21 NOTE — Therapy (Signed)
[facility] [address] [place], [place], [address] Phone: [phone]   Fax:  [phone]  Physical Therapy Treatment  Patient Details  Name: Patrick Kidd MRN: [id number] Date of Birth: 1945-12-13 Referring Provider: Mauricio Po, FNP  Encounter Date: 09/21/2015      PT End of Session - 09/21/15 1743    Visit Number 4   Number of Visits 9   Date for PT Re-Evaluation 10/05/15   Authorization Type Medicare primary; BCBS secondary   Authorization Time Period G Codes required   PT Start Time 1412  Pt arrived late to appt   PT Stop Time 1447   PT Time Calculation (min) 35 min   Equipment Utilized During Treatment Gait belt   Activity Tolerance Patient tolerated treatment well   Behavior During Therapy [facility] for tasks assessed/performed      Past Medical History:  Diagnosis Date  . Acrophobia   . Chronic cough    PMH of  . Diverticulosis 07-03-2010   Colonoscopy.   Marland Kitchen GERD (gastroesophageal reflux disease)   . Gout   . Hx of adenomatous colonic polyps 07-03-2010   Colonoscopy. F/U due 2017  . Hydrocele   . Nonspecific elevation of levels of transaminase or lactic acid dehydrogenase (LDH)   . Peripheral neuropathy ([place])   . Plantar fasciitis    PMH of    Past Surgical History:  Procedure Laterality Date  . CATARACT EXTRACTION, BILATERAL  12/2011    Dr Gershon Crane  . COLONOSCOPY W/ POLYPECTOMY  07/03/2010   2 adenomas, diverticulosis on right. Dr [name]  . FLEXIBLE SIGMOIDOSCOPY  2000  . WISDOM TOOTH EXTRACTION      There were no vitals filed for this visit.      Subjective Assessment - 09/21/15 1412    Subjective "I've been in [place] for 3 days. It was good...wasn't able to do my exercises while I was there." No falls, no changes to report.    Pertinent History Likes to be called "[name]".  PMH significant for: GERD, DM II, peripheral neuropathy, gout, chronic cough, diverticulosis, plantar  fasciiti, hydrocele, acrophobia   Patient Stated Goals "I want to walk straight."   Currently in Pain? No/denies                         [facility] Adult PT Treatment/Exercise - 09/21/15 0001      Transfers   Floor to Transfer 5: Supervision;4: Min guard   Floor to Transfer Details (indicate cue type and reason) Pt able to safely transfer from supine on floor > standing with use of single UE support on surface (mat table). Per pt request to do so without holding onto anything, attempted to transition from R then L tall kneeling > standing, but pt unable due to hip weakness and decreased flexibility in B heel cords. Also attempted tall kneeling > deep squat with B hands on ground > standing, but pt unable due to weakness.     Exercises   Exercises Other Exercises   Other Exercises  B clamshells resistedby green Tband x20 reps with cueing for alignment, technique.             Balance Exercises - 09/21/15 1741      Balance Exercises: Standing   Standing Eyes Closed Head turns;Foam/compliant surface;Wide (BOA);Solid surface;Other (comment)  1 pillow, then feet 6" apart: horiz, vert, diag turns x10 ea   SLS Eyes open;Solid surface;Intermittent upper extremity support;4 reps  Significant difficulty due to hip drop (bilat); < 5 sec            PT Education - 09/21/15 1737    Education provided Yes   Education Details HEP: reviewed/modified corner balance; removed SLS; added clamshells resisted by green Tband.   Person(s) Educated Patient   Methods Explanation;Demonstration;Handout;Verbal cues;Tactile cues   Comprehension Verbalized understanding;Returned demonstration          PT Short Term Goals - 09/05/15 2059      PT SHORT TERM GOAL #1   Title STG's = LTG's           PT Long Term Goals - 09/05/15 2059      PT LONG TERM GOAL #1   Title Pt will report consistent compliance with walking program > 3 days/week to maximize improvements in functional  endurance.  (Target date: 10/03/15)     PT LONG TERM GOAL #2   Title Pt will independently perform HEP to maximize functional gains made in PT.  (10/03/15)     PT LONG TERM GOAL #3   Title Complete Berg and improve score by 5 points from baseline to indicate improved standing balance.  (10/03/15)     PT LONG TERM GOAL #4   Title Pt will improve 6MWT distance form 638' to >/= 838' to indicate significant improvement in functional endurance.  (10/03/15)     PT LONG TERM GOAL #5   Title Pt will transfer from supine on floor to seated on EOM with supervision without use of external aid to address patient-stated goal of getting up from floor holding onto chair.  (10/03/15)     Additional Long Term Goals   Additional Long Term Goals Yes     PT LONG TERM GOAL #6   Title Pt will ambulate x300' over unlevel, paved surfaces (including curb steps and ramps) with mod I using LRAD to indicate increased safety with community mobility. (10/03/15)                Plan - 09/21/15 1744    Clinical Impression Statement Session focused on reviewing/modifying HEP and on addressing pt-stated goal to get up from floor without holding onto solid surface. Pt unable to stand from tall kneeling (R or L) or tall kneeling without single UE use due to limited B hip/knee strength, decreased B heel cord flexibility.    Rehab Potential Good   Clinical Impairments Affecting Rehab Potential Pt not agreeable to use of assistive device or DME.   PT Frequency 2x / week   PT Duration 4 weeks   PT Treatment/Interventions ADLs/Self Care Home Management;Therapeutic activities;Functional mobility training;Stair training;Gait training;DME Instruction;Therapeutic exercise;Balance training;Neuromuscular re-education;Patient/family education;Orthotic Fit/Training   PT Next Visit Plan Lead ups to getting up from floor without UE use: half kneeling > stand w/ back knee elevated to lead up to activity. Stretch B heel cords, strengthen B  hips.   Consulted and Agree with Plan of Care Patient      Patient will benefit from skilled therapeutic intervention in order to improve the following deficits and impairments:  Abnormal gait, Decreased balance, Decreased endurance, Impaired sensation, Decreased strength, Decreased activity tolerance, Increased edema, Impaired flexibility  Visit Diagnosis: Other abnormalities of gait and mobility  Unsteadiness on feet  Other disturbances of skin sensation  Muscle weakness (generalized)     Problem List Patient Active Problem List   Diagnosis Date Noted  . Type 2 diabetes mellitus ([place]) 08/22/2015  . Rash and nonspecific skin eruption 08/22/2015  .  Abdominal distention 08/22/2015  . Acute upper respiratory infection 01/25/2015  . Elevated PSA 11/18/2014  . Thrombocytopenia ([place]) 03/19/2014  . Hypersomnolence 03/19/2014  . Dyspnea 03/07/2014  . Plantar fasciitis of right foot 02/17/2014  . Acute bronchitis due to Streptococcus 01/06/2014  . Obesity (BMI 30-39.9) 07/19/2013  . Peripheral neuropathy ([place]) 01/11/2013  . Polyclonal gammopathy 01/11/2013  . Impaired glucose tolerance 01/11/2013  . Edema 01/11/2013  . Hx of adenomatous colonic polyps 07/16/2012  . Chronic cough 05/31/2010  . GERD 10/04/2009  . History of gout 08/03/2006  . ACROPHOBIA 07/10/2006    Billie Ruddy, PT, DPT [facility] [address] [place], [place], [address] Phone: [phone]   Fax:  [phone] 09/21/15, 5:49 PM  Name: Patrick Kidd MRN: [id number] Date of Birth: 08-19-1945

## 2015-09-21 NOTE — Patient Instructions (Addendum)
Walking Program:  Continue walking for exercise for 3.47minutes, 1-2times/day, 5days/week.  Progress your walkingprogram by adding 1 minute to your routine each week, as tolerated. Be sure to wear good walking shoes, walk in a safe environment and only progress to your tolerance.   Remember to pace your breathing as you walk (breathe in for 3 steps, out for 3 steps).  Feet Apart, Head Motion - Eyes Closed   Stand with your back to a corner with a stable chair in front of you. Stand with feet 5-6 inches apart. Close eyes and move head slowly: up and down 10 times; right to left 10 times; diagonally from up-right to down-left 10 times; and diagonally from up-left to down-right 10 times. Do _2__ sessions per day.   Tandem Stance    Stand with counter top on one side; hand close to counter. Right foot in front of left, heel touching toe both feet "straight ahead". Balance in this position _15__ seconds. Do with left foot in front of right. Perform this exercise 4 times with each leg leading. Perform __2__ sessions per day.   Abduction: Clam (Eccentric) - Side-Lying    Lie on side with knees bent. Loop GREEN resistance band around knees. Place the elbow of your top hand on the bed (to keep your top hip from rolling backwards). Lift top knee, keeping feet together. Keep trunk steady. Slowly lower for 3-5 seconds. __20_ reps per set,__2__ sets per day on each leg. * You should feel this in your "back pocket" muscle.  To progress: When you're able to perform 20 consecutive reps with the green band without difficulty, start using the blue resistance band, starting at 10 reps and increasing by 2-3 reps at a time, as tolerated.

## 2015-09-26 ENCOUNTER — Encounter: Payer: Self-pay | Admitting: Physical Therapy

## 2015-09-26 ENCOUNTER — Ambulatory Visit: Payer: Medicare Other | Admitting: Physical Therapy

## 2015-09-26 DIAGNOSIS — R2689 Other abnormalities of gait and mobility: Secondary | ICD-10-CM

## 2015-09-26 DIAGNOSIS — R208 Other disturbances of skin sensation: Secondary | ICD-10-CM | POA: Diagnosis not present

## 2015-09-26 DIAGNOSIS — M6281 Muscle weakness (generalized): Secondary | ICD-10-CM

## 2015-09-26 DIAGNOSIS — R2681 Unsteadiness on feet: Secondary | ICD-10-CM | POA: Diagnosis not present

## 2015-09-27 ENCOUNTER — Other Ambulatory Visit: Payer: Self-pay | Admitting: Neurology

## 2015-09-28 ENCOUNTER — Ambulatory Visit: Payer: Medicare Other | Admitting: Physical Therapy

## 2015-09-28 DIAGNOSIS — R2689 Other abnormalities of gait and mobility: Secondary | ICD-10-CM | POA: Diagnosis not present

## 2015-09-28 DIAGNOSIS — R208 Other disturbances of skin sensation: Secondary | ICD-10-CM

## 2015-09-28 DIAGNOSIS — R2681 Unsteadiness on feet: Secondary | ICD-10-CM

## 2015-09-28 DIAGNOSIS — M6281 Muscle weakness (generalized): Secondary | ICD-10-CM | POA: Diagnosis not present

## 2015-09-28 NOTE — Therapy (Signed)
[facility] [address] [place], [place], [address] Phone: [phone]   Fax:  [phone]  Physical Therapy Treatment  Patient Details  Name: Patrick Kidd MRN: [id number] Date of Birth: 1945/11/03 Referring Provider: Mauricio Po, FNP  Encounter Date: 09/28/2015      PT End of Session - 09/28/15 1434    Visit Number 6   Number of Visits 9   Date for PT Re-Evaluation 10/05/15   Authorization Type Medicare primary; BCBS secondary   Authorization Time Period G Codes required   PT Start Time 1318   PT Stop Time 1401   PT Time Calculation (min) 43 min   Activity Tolerance Patient tolerated treatment well   Behavior During Therapy [facility] for tasks assessed/performed      Past Medical History:  Diagnosis Date  . Acrophobia   . Chronic cough    PMH of  . Diverticulosis 07-03-2010   Colonoscopy.   Marland Kitchen GERD (gastroesophageal reflux disease)   . Gout   . Hx of adenomatous colonic polyps 07-03-2010   Colonoscopy. F/U due 2017  . Hydrocele   . Nonspecific elevation of levels of transaminase or lactic acid dehydrogenase (LDH)   . Peripheral neuropathy ([place])   . Plantar fasciitis    PMH of    Past Surgical History:  Procedure Laterality Date  . CATARACT EXTRACTION, BILATERAL  12/2011    Dr Gershon Crane  . COLONOSCOPY W/ POLYPECTOMY  07/03/2010   2 adenomas, diverticulosis on right. Dr [name]  . FLEXIBLE SIGMOIDOSCOPY  2000  . WISDOM TOOTH EXTRACTION      There were no vitals filed for this visit.      Subjective Assessment - 09/28/15 1322    Subjective Pt states, "I'm up to 6 minutes" (in reference to walking program). Pt also reports having gotten up by himself (no external support) at home.    Pertinent History Likes to be called "Patrick Kidd".  PMH significant for: GERD, DM II, peripheral neuropathy, gout, chronic cough, diverticulosis, plantar fasciiti, hydrocele, acrophobia   Patient Stated Goals "I want to  walk straight."   Currently in Pain? No/denies            [facility] PT Assessment - 09/28/15 0001      Ambulation/Gait   Ambulation/Gait Yes   Ambulation/Gait Assistance 6: Modified independent (Device/Increase time)   Ambulation/Gait Assistance Details Gait x750' over unlevel, paved and grass surfaces with mod I (for increased time, no AD). No overt LOB.   Ambulation Distance (Feet) 1992 Feet  750' outdoors then 1,242' (on 6MWT)   Assistive device None   Gait Pattern Step-through pattern;Decreased stride length;Right foot flat;Left foot flat;Trendelenburg                     [facility] Adult PT Treatment/Exercise - 09/28/15 0001      Transfers   Transfers Sit to Stand;Stand to Sit   Sit to Stand 7: Independent   Stand to Sit 7: Independent   Floor to Transfer 5: Supervision   Floor to Transfer Details (indicate cue type and reason) from standing > supine with single UE support at mat table, mod I; from supine > standing (via tall kneeling) without external/UE support with (S).   x2     Ambulation/Gait   Ambulation Surface Unlevel;Level;Outdoor;Indoor;Paved;Gravel;Grass   Ramp 6: Modified independent (Device)  no AD; increased time   Curb 6: Modified independent (Device/increase time)  no AD     Exercises   Exercises  Other Exercises   Other Exercises  R/L half kneeling <> tall kneeling x10 reps total with intermittent RUE support at standard chair. R/L tall kneeling hip flexor self-stretch 2 x45-sec holds per side; R tall kneeling rows resisted by Tband for balance perturbation x20 reps (no LOB).  Attempted R tall kneeling with back (left) leg positioned at top of large blue wedge; however, pt unable due to weakness.                PT Education - 09/28/15 1410    Education provided Yes   Education Details Explained 6MWT findings, progress, and implications.    Person(s) Educated Patient   Methods Explanation;Demonstration;Verbal cues;Handout   Comprehension  Verbalized understanding;Returned demonstration          PT Short Term Goals - 09/05/15 2059      PT SHORT TERM GOAL #1   Title STG's = LTG's           PT Long Term Goals - 09/28/15 1326      PT LONG TERM GOAL #1   Title Pt will report consistent compliance with walking program > 3 days/week to maximize improvements in functional endurance.  (Target date: 10/03/15)   Baseline Met 8/25.   Status Achieved     PT LONG TERM GOAL #2   Title Pt will independently perform HEP to maximize functional gains made in PT.  (10/03/15)   Baseline Met 8/25.   Status Achieved     PT LONG TERM GOAL #3   Title Complete Berg and improve score by 5 points from baseline to indicate improved standing balance.  (10/03/15)     PT LONG TERM GOAL #4   Title Pt will improve 6MWT distance form 638' to >/= 838' to indicate significant improvement in functional endurance.  (10/03/15)   Baseline 8/25: 6MWT distance = 1,242'     PT LONG TERM GOAL #5   Title Pt will transfer from supine on floor to seated on EOM with supervision without use of external aid to address patient-stated goal of getting up from floor holding onto chair.  (10/03/15)   Baseline Met 8/25.   Status Achieved     PT LONG TERM GOAL #6   Title Pt will ambulate x300' over unlevel, paved surfaces (including curb steps and ramps) with mod I using LRAD to indicate increased safety with community mobility. (10/03/15)                Plan - 09/28/15 1435    Clinical Impression Statement Session focused on functional hip strengthening and beginning to assess LTG's, as pt demonstrates excellent progress with balance, gait stability, and functional endurance. Pt met LTG's for 6MWT, supine > standing without external support, and compliance with HEP and walking program.    Rehab Potential Good   Clinical Impairments Affecting Rehab Potential Pt not agreeable to use of assistive device or DME.   PT Frequency 2x / week   PT Duration 4 weeks    PT Treatment/Interventions ADLs/Self Care Home Management;Therapeutic activities;Functional mobility training;Stair training;Gait training;DME Instruction;Therapeutic exercise;Balance training;Neuromuscular re-education;Patient/family education;Orthotic Fit/Training   PT Next Visit Plan Finish checking LTG's and tentatively plan to DC. *GCODES, FOTO.  Provide pt with black Tband to continue to progres clamshells.   Consulted and Agree with Plan of Care Patient      Patient will benefit from skilled therapeutic intervention in order to improve the following deficits and impairments:  Abnormal gait, Decreased balance, Decreased endurance, Impaired sensation, Decreased  strength, Decreased activity tolerance, Increased edema, Impaired flexibility  Visit Diagnosis: Other abnormalities of gait and mobility  Unsteadiness on feet  Other disturbances of skin sensation  Muscle weakness (generalized)     Problem List Patient Active Problem List   Diagnosis Date Noted  . Type 2 diabetes mellitus ([place]) 08/22/2015  . Rash and nonspecific skin eruption 08/22/2015  . Abdominal distention 08/22/2015  . Acute upper respiratory infection 01/25/2015  . Elevated PSA 11/18/2014  . Thrombocytopenia ([place]) 03/19/2014  . Hypersomnolence 03/19/2014  . Dyspnea 03/07/2014  . Plantar fasciitis of right foot 02/17/2014  . Acute bronchitis due to Streptococcus 01/06/2014  . Obesity (BMI 30-39.9) 07/19/2013  . Peripheral neuropathy ([place]) 01/11/2013  . Polyclonal gammopathy 01/11/2013  . Impaired glucose tolerance 01/11/2013  . Edema 01/11/2013  . Hx of adenomatous colonic polyps 07/16/2012  . Chronic cough 05/31/2010  . GERD 10/04/2009  . History of gout 08/03/2006  . ACROPHOBIA 07/10/2006    Billie Ruddy, PT, DPT [facility] [address] [place], [place], [address] Phone: [phone]   Fax:  [phone] 09/28/15, 2:38 PM  Name: Patrick Kidd MRN: [id number] Date of Birth: 05-Nov-1945

## 2015-09-28 NOTE — Therapy (Signed)
[facility] [address] [place], [place], [address] Phone: [phone]   Fax:  [phone]  Physical Therapy Treatment  Patient Details  Name: Patrick Kidd MRN: [id number] Date of Birth: 03-29-1945 Referring Provider: Mauricio Po, FNP  Encounter Date: 09/26/2015   09/26/15 1448  PT Visits / Re-Eval  Visit Number 5  Number of Visits 9  Date for PT Re-Evaluation 10/05/15  Authorization  Authorization Type Medicare primary; BCBS secondary  Authorization Time Period G Codes required  PT Time Calculation  PT Start Time 1446  PT Stop Time 1530  PT Time Calculation (min) 44 min  PT - End of Session  Equipment Utilized During Treatment Gait belt  Activity Tolerance Patient tolerated treatment well  Behavior During Therapy [facility] for tasks assessed/performed     Past Medical History:  Diagnosis Date  . Acrophobia   . Chronic cough    PMH of  . Diverticulosis 07-03-2010   Colonoscopy.   Marland Kitchen GERD (gastroesophageal reflux disease)   . Gout   . Hx of adenomatous colonic polyps 07-03-2010   Colonoscopy. F/U due 2017  . Hydrocele   . Nonspecific elevation of levels of transaminase or lactic acid dehydrogenase (LDH)   . Peripheral neuropathy ([place])   . Plantar fasciitis    PMH of    Past Surgical History:  Procedure Laterality Date  . CATARACT EXTRACTION, BILATERAL  12/2011    Dr Gershon Crane  . COLONOSCOPY W/ POLYPECTOMY  07/03/2010   2 adenomas, diverticulosis on right. Dr [name]  . FLEXIBLE SIGMOIDOSCOPY  2000  . WISDOM TOOTH EXTRACTION      There were no vitals filed for this visit.     09/26/15 1448  Symptoms/Limitations  Subjective No new complaints. No falls or pain to report. Up to 5 minutes of nonstop walking with walking program,  Pertinent History Likes to be called "Patrick Kidd".  PMH significant for: GERD, DM II, peripheral neuropathy, gout, chronic cough, diverticulosis, plantar fasciiti, hydrocele,  acrophobia  Patient Stated Goals "I want to walk straight."  Pain Assessment  Currently in Pain? No/denies      09/26/15 1454  Exercises  Other Exercises  bil heel cord/calf stretching: with chair at top of ramp on platform, pt leaning forwward with heels maintaining contact, 30 sec hold x 2 reps, UE support on chair. with bil rail support: heels dropping off edge of bottom step, 30 sec hold x 2 reps.                        Knee/Hip Exercises: Standing  Other Standing Knee Exercises 4 way hip kicks with red band resistance x 10 reps each direction on bil legs with single UE support. cues on form and posture with ex's.  Other Standing Knee Exercises side stepping with red band resistance x 3 laps each direction with light UE support, cues on form and posture.   Knee/Hip Exercises: Supine  Bridges AROM;Strengthening;Both;1 set;10 reps;Limitations  Bridges Limitations 5 sec holds, cues on form and hold times.  with single leg bridge: propped leg not working up on stool.   Bridges with Clamshell AROM;Strengthening;Both;1 set;10 reps;Limitations (using blue band around knees)  Single Leg Bridge AROM;Strengthening;Both;1 set;10 reps;Limitations  Other Supine Knee/Hip Exercises hip flexion off/onto mat with 4# ankle weight  Knee/Hip Exercises: Prone  Hip Extension AROM;Strengthening;Both;1 set;10 reps;Limitations  Hip Extension Limitations QUADRUPED: alternating leg lifts out x 10 each side with cues on form  Other Prone Exercises  QUADRUPED: alternating glut kick backs x 10 each side with cues on form/technique; alternating "fire hydrant's" x 10 reps each side.                 PT Short Term Goals - 09/05/15 2059      PT SHORT TERM GOAL #1   Title STG's = LTG's           PT Long Term Goals - 09/05/15 2059      PT LONG TERM GOAL #1   Title Pt will report consistent compliance with walking program > 3 days/week to maximize improvements in functional endurance.  (Target date: 10/03/15)      PT LONG TERM GOAL #2   Title Pt will independently perform HEP to maximize functional gains made in PT.  (10/03/15)     PT LONG TERM GOAL #3   Title Complete Berg and improve score by 5 points from baseline to indicate improved standing balance.  (10/03/15)     PT LONG TERM GOAL #4   Title Pt will improve 6MWT distance form 638' to >/= 838' to indicate significant improvement in functional endurance.  (10/03/15)     PT LONG TERM GOAL #5   Title Pt will transfer from supine on floor to seated on EOM with supervision without use of external aid to address patient-stated goal of getting up from floor holding onto chair.  (10/03/15)     Additional Long Term Goals   Additional Long Term Goals Yes     PT LONG TERM GOAL #6   Title Pt will ambulate x300' over unlevel, paved surfaces (including curb steps and ramps) with mod I using LRAD to indicate increased safety with community mobility. (10/03/15)         09/26/15 1448  Plan  Clinical Impression Statement Today's skilled session focused on hip strengthening and heel cord stretching. No issues reported in session. Pt is making steady progress toward goals.   Pt will benefit from skilled therapeutic intervention in order to improve on the following deficits Abnormal gait;Decreased balance;Decreased endurance;Impaired sensation;Decreased strength;Decreased activity tolerance;Increased edema;Impaired flexibility  Rehab Potential Good  Clinical Impairments Affecting Rehab Potential Pt not agreeable to use of assistive device or DME.  PT Frequency 2x / week  PT Duration 4 weeks  PT Treatment/Interventions ADLs/Self Care Home Management;Therapeutic activities;Functional mobility training;Stair training;Gait training;DME Instruction;Therapeutic exercise;Balance training;Neuromuscular re-education;Patient/family education;Orthotic Fit/Training  PT Next Visit Plan Lead ups to getting up from floor without UE use: half kneeling > stand w/ back knee  elevated to lead up to activity. Stretch B heel cords, strengthen B hips.  Consulted and Agree with Plan of Care Patient          Patient will benefit from skilled therapeutic intervention in order to improve the following deficits and impairments:  Abnormal gait, Decreased balance, Decreased endurance, Impaired sensation, Decreased strength, Decreased activity tolerance, Increased edema, Impaired flexibility  Visit Diagnosis: Other abnormalities of gait and mobility  Unsteadiness on feet  Other disturbances of skin sensation  Muscle weakness (generalized)     Problem List Patient Active Problem List   Diagnosis Date Noted  . Type 2 diabetes mellitus ([place]) 08/22/2015  . Rash and nonspecific skin eruption 08/22/2015  . Abdominal distention 08/22/2015  . Acute upper respiratory infection 01/25/2015  . Elevated PSA 11/18/2014  . Thrombocytopenia ([place]) 03/19/2014  . Hypersomnolence 03/19/2014  . Dyspnea 03/07/2014  . Plantar fasciitis of right foot 02/17/2014  . Acute bronchitis due to Streptococcus 01/06/2014  .  Obesity (BMI 30-39.9) 07/19/2013  . Peripheral neuropathy ([place]) 01/11/2013  . Polyclonal gammopathy 01/11/2013  . Impaired glucose tolerance 01/11/2013  . Edema 01/11/2013  . Hx of adenomatous colonic polyps 07/16/2012  . Chronic cough 05/31/2010  . GERD 10/04/2009  . History of gout 08/03/2006  . ACROPHOBIA 07/10/2006    Willow Ora, PTA, [facility] [address], [place], [place] [address] [phone] 09/28/15, 8:29 AM   Name: Patrick Kidd MRN: [id number] Date of Birth: [birth year]

## 2015-10-03 ENCOUNTER — Ambulatory Visit: Payer: Medicare Other | Admitting: Physical Therapy

## 2015-10-03 ENCOUNTER — Encounter: Payer: Self-pay | Admitting: Physical Therapy

## 2015-10-03 DIAGNOSIS — M6281 Muscle weakness (generalized): Secondary | ICD-10-CM | POA: Diagnosis not present

## 2015-10-03 DIAGNOSIS — R2689 Other abnormalities of gait and mobility: Secondary | ICD-10-CM

## 2015-10-03 DIAGNOSIS — R208 Other disturbances of skin sensation: Secondary | ICD-10-CM | POA: Diagnosis not present

## 2015-10-03 DIAGNOSIS — R2681 Unsteadiness on feet: Secondary | ICD-10-CM

## 2015-10-03 DIAGNOSIS — L920 Granuloma annulare: Secondary | ICD-10-CM | POA: Diagnosis not present

## 2015-10-03 DIAGNOSIS — L820 Inflamed seborrheic keratosis: Secondary | ICD-10-CM | POA: Diagnosis not present

## 2015-10-03 NOTE — Therapy (Addendum)
[facility] [address] [place], [place], [address] Phone: [phone]   Fax:  [phone]  Physical Therapy Treatment  and Discharge Summary    Patient Details  Name: Patrick Kidd MRN: [id number] Date of Birth: 10-Feb-1945 Referring Provider: Mauricio Po, FNP  Encounter Date: 10/03/2015      PT End of Session - 10/03/15 1317    Visit Number 7   Number of Visits 9   Date for PT Re-Evaluation 10/05/15   Authorization Type Medicare primary; BCBS secondary   Authorization Time Period G Codes required   PT Start Time 1315   PT Stop Time 1340  discharge visit, not all time was needed   PT Time Calculation (min) 25 min   Activity Tolerance Patient tolerated treatment well   Behavior During Therapy [facility] for tasks assessed/performed      Past Medical History:  Diagnosis Date  . Acrophobia   . Chronic cough    PMH of  . Diverticulosis 07-03-2010   Colonoscopy.   Marland Kitchen GERD (gastroesophageal reflux disease)   . Gout   . Hx of adenomatous colonic polyps 07-03-2010   Colonoscopy. F/U due 2017  . Hydrocele   . Nonspecific elevation of levels of transaminase or lactic acid dehydrogenase (LDH)   . Peripheral neuropathy ([place])   . Plantar fasciitis    PMH of    Past Surgical History:  Procedure Laterality Date  . CATARACT EXTRACTION, BILATERAL  12/2011    Dr Gershon Crane  . COLONOSCOPY W/ POLYPECTOMY  07/03/2010   2 adenomas, diverticulosis on right. Dr [name]  . FLEXIBLE SIGMOIDOSCOPY  2000  . WISDOM TOOTH EXTRACTION      There were no vitals filed for this visit.      Subjective Assessment - 10/03/15 1316    Subjective "Up to 10 minutes with walking program, last 2 are shaky though". Has also continued to get off floor/ground by himself without any issues.    Pertinent History Likes to be called "[name]".  PMH significant for: GERD, DM II, peripheral neuropathy, gout, chronic cough, diverticulosis, plantar  fasciiti, hydrocele, acrophobia   Patient Stated Goals "I want to walk straight."   Currently in Pain? No/denies            [facility] PT Assessment - 10/03/15 1318      Berg Balance Test   Sit to Stand Able to stand without using hands and stabilize independently   Standing Unsupported Able to stand safely 2 minutes   Sitting with Back Unsupported but Feet Supported on Floor or Stool Able to sit safely and securely 2 minutes   Stand to Sit Sits safely with minimal use of hands   Transfers Able to transfer safely, minor use of hands   Standing Unsupported with Eyes Closed Able to stand 10 seconds safely   Standing Ubsupported with Feet Together Able to place feet together independently and stand 1 minute safely   From Standing, Reach Forward with Outstretched Arm Can reach confidently >25 cm (10")   From Standing Position, Pick up Object from Floor Able to pick up shoe safely and easily   From Standing Position, Turn to Look Behind Over each Shoulder Looks behind one side only/other side shows less weight shift   Turn 360 Degrees Able to turn 360 degrees safely one side only in 4 seconds or less   Standing Unsupported, Alternately Place Feet on Step/Stool Able to stand independently and safely and complete 8 steps in 20 seconds  9.25 sec's   Standing Unsupported, One Foot in [place] to plae foot ahead of the other independently and hold 30 seconds   Standing on One Leg Able to lift leg independently and hold equal to or more than 3 seconds   Total Score 51   Berg comment: 51/56= moderate risk of falling (from 46/56 on 09/13/15)           [facility] Adult PT Treatment/Exercise - 10/03/15 1318      Transfers   Transfers Sit to Stand;Stand to Sit   Sit to Stand 7: Independent   Stand to Sit 7: Independent     Ambulation/Gait   Ambulation/Gait Yes   Ambulation/Gait Assistance 6: Modified independent (Device/Increase time)   Ambulation Distance (Feet) --  >/= 500   Assistive device  None   Gait Pattern Step-through pattern   Ambulation Surface Level;Unlevel;Indoor;Outdoor;Paved   Ramp 6: Modified independent (Device)   Curb 6: Modified independent (Device/increase time)            PT Short Term Goals - 09/05/15 2059      PT SHORT TERM GOAL #1   Title STG's = LTG's           PT Long Term Goals - 10/03/15 1317      PT LONG TERM GOAL #1   Title Pt will report consistent compliance with walking program > 3 days/week to maximize improvements in functional endurance.  (Target date: 10/03/15)   Baseline Met 8/25.   Status Achieved     PT LONG TERM GOAL #2   Title Pt will independently perform HEP to maximize functional gains made in PT.  (10/03/15)   Baseline Met 8/25.   Status Achieved     PT LONG TERM GOAL #3   Title Complete Berg and improve score by 5 points from baseline to indicate improved standing balance.  (10/03/15)   Baseline 10/03/15: 51/56 today (was 46/56 on eval)   Status Achieved     PT LONG TERM GOAL #4   Title Pt will improve 6MWT distance form 638' to >/= 838' to indicate significant improvement in functional endurance.  (10/03/15)   Baseline 8/25: 6MWT distance = 1,242'   Status Achieved     PT LONG TERM GOAL #5   Title Pt will transfer from supine on floor to seated on EOM with supervision without use of external aid to address patient-stated goal of getting up from floor holding onto chair.  (10/03/15)   Baseline Met 8/25.   Status Achieved     PT LONG TERM GOAL #6   Title Pt will ambulate x300' over unlevel, paved surfaces (including curb steps and ramps) with mod I using LRAD to indicate increased safety with community mobility. (10/03/15)    Baseline met on 10/03/15   Status Achieved           Plan - 10/03/15 1317    Clinical Impression Statement Remaining LTGs met today. Pt agreeable to discharge today.   Rehab Potential Good   Clinical Impairments Affecting Rehab Potential Pt not agreeable to use of assistive device or  DME.   PT Frequency 2x / week   PT Duration 4 weeks   PT Treatment/Interventions ADLs/Self Care Home Management;Therapeutic activities;Functional mobility training;Stair training;Gait training;DME Instruction;Therapeutic exercise;Balance training;Neuromuscular re-education;Patient/family education;Orthotic Fit/Training   PT Next Visit Plan discharge per PT plan of care.   Consulted and Agree with Plan of Care Patient      Patient will benefit from skilled therapeutic intervention  in order to improve the following deficits and impairments:  Abnormal gait, Decreased balance, Decreased endurance, Impaired sensation, Decreased strength, Decreased activity tolerance, Increased edema, Impaired flexibility  Visit Diagnosis: Other abnormalities of gait and mobility  Unsteadiness on feet  Other disturbances of skin sensation  Muscle weakness (generalized)       G-Codes - 10/22/15 1409    Functional Assessment Tool Used 6 MWT distance=1, 242 feet no AD   Functional Limitation Mobility: Walking and moving around      Problem List Patient Active Problem List   Diagnosis Date Noted  . Type 2 diabetes mellitus ([place]) 08/22/2015  . Rash and nonspecific skin eruption 08/22/2015  . Abdominal distention 08/22/2015  . Acute upper respiratory infection 01/25/2015  . Elevated PSA 11/18/2014  . Thrombocytopenia ([place]) 03/19/2014  . Hypersomnolence 03/19/2014  . Dyspnea 03/07/2014  . Plantar fasciitis of right foot 02/17/2014  . Acute bronchitis due to Streptococcus 01/06/2014  . Obesity (BMI 30-39.9) 07/19/2013  . Peripheral neuropathy ([place]) 01/11/2013  . Polyclonal gammopathy 01/11/2013  . Impaired glucose tolerance 01/11/2013  . Edema 01/11/2013  . Hx of adenomatous colonic polyps 07/16/2012  . Chronic cough 05/31/2010  . GERD 10/04/2009  . History of gout 08/03/2006  . ACROPHOBIA 07/10/2006    Willow Ora, PTA, [facility] [address] [place], [place] [address] [phone] 2015/10/22, 2:11 PM   Name: TYRION GLAUDE MRN: [id number] Date of Birth: 1945/04/28   Addendum by Billie Ruddy, PT, [facility] [address] [place], [place], [address] Phone: [phone]   Fax:  [phone] 2015/10/23, 8:07 AM      G-Codes - Oct 22, 2015 1409    Functional Assessment Tool Used 6 MWT distance=1, 242 feet no AD   Functional Limitation Mobility: Walking and moving around   Mobility: Walking and Moving Around Goal Status [phone]) At least 40 percent but less than 60 percent impaired, limited or restricted   Mobility: Walking and Moving Around Discharge Status [phone]) At least 20 percent but less than 40 percent impaired, limited or restricted      Addendum by Billie Ruddy, PT, DPT  PHYSICAL THERAPY DISCHARGE SUMMARY  Visits from Start of Care: 7  Current functional level related to goals / functional outcomes: See above.   Remaining deficits: See above.   Education / Equipment: See above.  Plan: Patient agrees to discharge.  Patient goals were met. Patient is being discharged due to meeting the stated rehab goals.  ?????        Billie Ruddy, PT, DPT [facility] [address] [place], [place], [address] Phone: [phone]   Fax:  [phone] 12/26/15, 10:22 AM

## 2015-10-04 ENCOUNTER — Ambulatory Visit (AMBULATORY_SURGERY_CENTER): Payer: Self-pay | Admitting: *Deleted

## 2015-10-04 ENCOUNTER — Telehealth: Payer: Self-pay | Admitting: *Deleted

## 2015-10-04 ENCOUNTER — Encounter: Payer: Self-pay | Admitting: *Deleted

## 2015-10-04 VITALS — Ht 74.0 in | Wt 265.0 lb

## 2015-10-04 DIAGNOSIS — K769 Liver disease, unspecified: Secondary | ICD-10-CM | POA: Insufficient documentation

## 2015-10-04 DIAGNOSIS — K7030 Alcoholic cirrhosis of liver without ascites: Secondary | ICD-10-CM | POA: Insufficient documentation

## 2015-10-04 DIAGNOSIS — K7689 Other specified diseases of liver: Secondary | ICD-10-CM

## 2015-10-04 DIAGNOSIS — Z86010 Personal history of colonic polyps: Secondary | ICD-10-CM

## 2015-10-04 HISTORY — DX: Alcoholic cirrhosis of liver without ascites: K70.30

## 2015-10-04 NOTE — Telephone Encounter (Signed)
3 pm slot block 9-14 and changed Mr Coggin to egd/colon  Thanks a bunch, Lelan Pons PV

## 2015-10-04 NOTE — Telephone Encounter (Signed)
I spoke to him and explained he has alcoholic cirrhosis and possibly fatty liver from metabolic syndrome also contributing. abnl area on liver correlates with cysts seen on prior chest CT and [place]  1) Add EGD to colonoscopy dx: Cirrhosis and portal hypertension -  2) block the 3 PM slot 9/14 when he has colonoscopy set up 3) I entered orders and told him to come for labs 4) advised no EtOH at all  PT/INR CBC AFP BMET ANA Ferritin Hep B S  Ab and core Ab total and Ag  Hep A Ab total

## 2015-10-04 NOTE — Telephone Encounter (Signed)
18 months ago , January 2016, pt was having shortness of breath and felt like esophagus was closing. He went to ED and had CT scan and many other tests and gave no indication of issue.  Pt states he went home and took a prevacid and s/s went away. He was asking in PV today about adding an EGD to his colon due to this issue. Issue has happened one other time and the prevacid did correct this as well.  He does drink 2-3 drinks a day and had a new CT scan 09-17-15 which showed an area of concern ( see staff message I sent).    Please advise  Thanks for your time,  Marijean Niemann

## 2015-10-04 NOTE — Progress Notes (Signed)
No egg or soy allergy known to patient  No issues with past sedation with any surgeries  or procedures, no intubation problems  No diet pills per patient No home 02 use per patient  No blood thinners per patient  Pt denies issues with constipation  No A fib or A flutter   

## 2015-10-05 ENCOUNTER — Ambulatory Visit: Payer: Medicare Other | Admitting: Physical Therapy

## 2015-10-09 ENCOUNTER — Other Ambulatory Visit (INDEPENDENT_AMBULATORY_CARE_PROVIDER_SITE_OTHER): Payer: Medicare Other

## 2015-10-09 DIAGNOSIS — K7030 Alcoholic cirrhosis of liver without ascites: Secondary | ICD-10-CM

## 2015-10-09 LAB — CBC WITH DIFFERENTIAL/PLATELET
BASOS ABS: 0 10*3/uL (ref 0.0–0.1)
Basophils Relative: 0.4 % (ref 0.0–3.0)
EOS ABS: 0.1 10*3/uL (ref 0.0–0.7)
Eosinophils Relative: 1 % (ref 0.0–5.0)
HCT: 33 % — ABNORMAL LOW (ref 39.0–52.0)
Hemoglobin: 10.9 g/dL — ABNORMAL LOW (ref 13.0–17.0)
LYMPHS ABS: 1.7 10*3/uL (ref 0.7–4.0)
LYMPHS PCT: 26.4 % (ref 12.0–46.0)
MCHC: 33.2 g/dL (ref 30.0–36.0)
MCV: 91.3 fl (ref 78.0–100.0)
MONO ABS: 0.7 10*3/uL (ref 0.1–1.0)
Monocytes Relative: 9.9 % (ref 3.0–12.0)
NEUTROS ABS: 4.1 10*3/uL (ref 1.4–7.7)
NEUTROS PCT: 62.3 % (ref 43.0–77.0)
PLATELETS: 161 10*3/uL (ref 150.0–400.0)
RBC: 3.61 Mil/uL — ABNORMAL LOW (ref 4.22–5.81)
RDW: 16.1 % — ABNORMAL HIGH (ref 11.5–15.5)
WBC: 6.6 10*3/uL (ref 4.0–10.5)

## 2015-10-09 LAB — BASIC METABOLIC PANEL
BUN: 13 mg/dL (ref 6–23)
CHLORIDE: 100 meq/L (ref 96–112)
CO2: 29 mEq/L (ref 19–32)
Calcium: 8.8 mg/dL (ref 8.4–10.5)
Creatinine, Ser: 1 mg/dL (ref 0.40–1.50)
GFR: 78.3 mL/min (ref >60.00)
Glucose, Bld: 230 mg/dL — ABNORMAL HIGH (ref 70–99)
POTASSIUM: 4.4 meq/L (ref 3.5–5.1)
SODIUM: 137 meq/L (ref 135–145)

## 2015-10-09 LAB — FERRITIN: FERRITIN: 25.3 ng/mL (ref 22.0–322.0)

## 2015-10-09 LAB — PROTIME-INR
INR: 1.3 ratio — AB (ref 0.8–1.0)
PROTHROMBIN TIME: 13.3 s — AB (ref 9.6–13.1)

## 2015-10-10 LAB — HEPATITIS A ANTIBODY, TOTAL: Hep A Total Ab: NONREACTIVE

## 2015-10-10 LAB — HEPATITIS B SURFACE ANTIGEN: HEP B S AG: NEGATIVE

## 2015-10-10 LAB — HEPATITIS B SURFACE ANTIBODY,QUALITATIVE: Hep B S Ab: NEGATIVE

## 2015-10-10 LAB — ANA: Anti Nuclear Antibody(ANA): NEGATIVE

## 2015-10-10 LAB — HEPATITIS B CORE ANTIBODY, TOTAL: HEP B C TOTAL AB: NONREACTIVE

## 2015-10-10 LAB — HEPATITIS C ANTIBODY: HCV AB: NEGATIVE

## 2015-10-10 LAB — AFP TUMOR MARKER: AFP-Tumor Marker: 4.1 ng/mL (ref <6.1)

## 2015-10-10 NOTE — Progress Notes (Signed)
No major abnormalities on the labs fortunately - will explain more at procedures

## 2015-10-11 ENCOUNTER — Telehealth: Payer: Self-pay | Admitting: Family

## 2015-10-11 DIAGNOSIS — K7460 Unspecified cirrhosis of liver: Secondary | ICD-10-CM

## 2015-10-11 NOTE — Telephone Encounter (Signed)
Please inform patient that Dr. [name] has request we complete an MRI of the liver. I have placed the orders and it will be scheduled shortly.

## 2015-10-12 NOTE — Telephone Encounter (Signed)
Pt aware.

## 2015-10-18 ENCOUNTER — Ambulatory Visit (AMBULATORY_SURGERY_CENTER): Payer: Medicare Other | Admitting: Internal Medicine

## 2015-10-18 ENCOUNTER — Encounter: Payer: Self-pay | Admitting: Internal Medicine

## 2015-10-18 VITALS — BP 134/65 | HR 80 | Temp 98.0°F | Resp 12 | Ht 74.0 in | Wt 265.0 lb

## 2015-10-18 DIAGNOSIS — D122 Benign neoplasm of ascending colon: Secondary | ICD-10-CM

## 2015-10-18 DIAGNOSIS — R1310 Dysphagia, unspecified: Secondary | ICD-10-CM | POA: Diagnosis not present

## 2015-10-18 DIAGNOSIS — K2970 Gastritis, unspecified, without bleeding: Secondary | ICD-10-CM

## 2015-10-18 DIAGNOSIS — K7030 Alcoholic cirrhosis of liver without ascites: Secondary | ICD-10-CM

## 2015-10-18 DIAGNOSIS — D124 Benign neoplasm of descending colon: Secondary | ICD-10-CM | POA: Diagnosis not present

## 2015-10-18 DIAGNOSIS — G4733 Obstructive sleep apnea (adult) (pediatric): Secondary | ICD-10-CM | POA: Diagnosis not present

## 2015-10-18 DIAGNOSIS — K2990 Gastroduodenitis, unspecified, without bleeding: Secondary | ICD-10-CM

## 2015-10-18 DIAGNOSIS — Z860100 Personal history of colon polyps, unspecified: Secondary | ICD-10-CM

## 2015-10-18 DIAGNOSIS — K2950 Unspecified chronic gastritis without bleeding: Secondary | ICD-10-CM | POA: Diagnosis not present

## 2015-10-18 MED ORDER — SODIUM CHLORIDE 0.9 % IV SOLN: 500.0000 mL | INTRAVENOUS | Status: DC

## 2015-10-18 NOTE — Progress Notes (Signed)
Called to room to assist during endoscopic procedure.  Patient ID and intended procedure confirmed with present staff. Received instructions for my participation in the procedure from the performing physician.  

## 2015-10-18 NOTE — Patient Instructions (Addendum)
There was mild inflammation in the stomach - I took biopsies. I dilated the esophagus to see if that helps you swallow better.  I removed 5 colon polyps - all look benign.  Will see what the MRI shows - [name] ordered that - just tell the tech to send me a report also. Will contact you after that is in.  My recommendation remains stop drinking alcohol as it is causing liver damage.   I will let you know pathology results and when to have another routine colonoscopy by mail.  I appreciate the opportunity to care for you. Patrick Mayer, MD, FACG   YOU HAD AN ENDOSCOPIC PROCEDURE TODAY AT [facility]:   Refer to the procedure report that was given to you for any specific questions about what was found during the examination.  If the procedure report does not answer your questions, please call your gastroenterologist to clarify.  If you requested that your care partner not be given the details of your procedure findings, then the procedure report has been included in a sealed envelope for you to review at your convenience later.  YOU SHOULD EXPECT: Some feelings of bloating in the abdomen. Passage of more gas than usual.  Walking can help get rid of the air that was put into your GI tract during the procedure and reduce the bloating. If you had a lower endoscopy (such as a colonoscopy or flexible sigmoidoscopy) you may notice spotting of blood in your stool or on the toilet paper. If you underwent a bowel prep for your procedure, you may not have a normal bowel movement for a few days.  Please Note:  You might notice some irritation and congestion in your nose or some drainage.  This is from the oxygen used during your procedure.  There is no need for concern and it should clear up in a day or so.  SYMPTOMS TO REPORT IMMEDIATELY:   Following lower endoscopy (colonoscopy or flexible sigmoidoscopy):  Excessive amounts of blood in the stool  Significant tenderness  or worsening of abdominal pains  Swelling of the abdomen that is new, acute  Fever of 100F or higher   Following upper endoscopy (EGD)  Vomiting of blood or coffee ground material  New chest pain or pain under the shoulder blades  Painful or persistently difficult swallowing  New shortness of breath  Fever of 100F or higher  Black, tarry-looking stools  For urgent or emergent issues, a gastroenterologist can be reached at any hour by calling [phone].   DIET:  We do recommend a small meal at first, but then you may proceed to your regular diet.  Drink plenty of fluids but you should avoid alcoholic beverages for 24 hours.  ACTIVITY:  You should plan to take it easy for the rest of today and you should NOT DRIVE or use heavy machinery until tomorrow (because of the sedation medicines used during the test).    FOLLOW UP: Our staff will call the number listed on your records the next business day following your procedure to check on you and address any questions or concerns that you may have regarding the information given to you following your procedure. If we do not reach you, we will leave a message.  However, if you are feeling well and you are not experiencing any problems, there is no need to return our call.  We will assume that you have returned to your regular daily activities without  incident.  If any biopsies were taken you will be contacted by phone or by letter within the next 1-3 weeks.  Please call us at [phone] if you have not heard about the biopsies in 3 weeks.    SIGNATURES/CONFIDENTIALITY: You and/or your care partner have signed paperwork which will be entered into your electronic medical record.  These signatures attest to the fact that that the information above on your After Visit Summary has been reviewed and is understood.  Full responsibility of the confidentiality of this discharge information lies with you and/or your care-partner.

## 2015-10-18 NOTE — Op Note (Signed)
[facility] Patient Name: Patrick Kidd Procedure Date: 10/18/2015 2:03 PM MRN: [id number] Endoscopist: Gatha Mayer , MD Age: 70 Referring MD:  Date of Birth: May 10, 1945 Gender: Male Account #: [id number] Procedure:                Colonoscopy Indications:              High risk colon cancer surveillance: Personal                            history of colonic polyps Medicines:                Propofol per Anesthesia, Monitored Anesthesia Care Procedure:                Pre-Anesthesia Assessment:                           - Prior to the procedure, a History and Physical                            was performed, and patient medications and                            allergies were reviewed. The patient's tolerance of                            previous anesthesia was also reviewed. The risks                            and benefits of the procedure and the sedation                            options and risks were discussed with the patient.                            All questions were answered, and informed consent                            was obtained. Prior Anticoagulants: The patient has                            taken no previous anticoagulant or antiplatelet                            agents. ASA Grade Assessment: III - A patient with                            severe systemic disease. After reviewing the risks                            and benefits, the patient was deemed in                            satisfactory condition to undergo the procedure.  After obtaining informed consent, the colonoscope                            was passed under direct vision. Throughout the                            procedure, the patient's blood pressure, pulse, and                            oxygen saturations were monitored continuously. The                            Model CF-HQ190L [id number]) scope was introduced                            through  the anus and advanced to the the cecum,                            identified by appendiceal orifice and ileocecal                            valve. The ileocecal valve, appendiceal orifice,                            and rectum were photographed. The quality of the                            bowel preparation was good. The colonoscopy was                            performed without difficulty. The patient tolerated                            the procedure well. The bowel preparation used was                            Miralax. Scope In: 2:18:02 PM Scope Out: 2:33:43 PM Scope Withdrawal Time: 0 hours 13 minutes 47 seconds  Total Procedure Duration: 0 hours 15 minutes 41 seconds  Findings:                 The perianal and digital rectal examinations were                            normal. Pertinent negatives include normal prostate                            (size, shape, and consistency).                           A 10 mm polyp was found in the ascending colon. The                            polyp was semi-pedunculated. The polyp was removed  with a hot snare. Resection and retrieval were                            complete. Verification of patient identification                            for the specimen was done. Estimated blood loss:                            none.                           Three sessile polyps were found in the descending                            colon. The polyps were 1 to 2 mm in size. These                            polyps were removed with a cold biopsy forceps.                            Resection and retrieval were complete. Verification                            of patient identification for the specimen was                            done. Estimated blood loss was minimal.                           A 7 mm polyp was found in the descending colon. The                            polyp was semi-pedunculated. The polyp was removed                             with a cold snare. Resection and retrieval were                            complete. Verification of patient identification                            for the specimen was done. Estimated blood loss was                            minimal.                           The exam was otherwise without abnormality on                            direct and retroflexion views. Complications:            No immediate complications. Estimated blood loss:  None. Estimated Blood Loss:     Estimated blood loss: none. Impression:               - One 10 mm polyp in the ascending colon, removed                            with a hot snare. Resected and retrieved.                           - Three 1 to 2 mm polyps in the descending colon,                            removed with a cold biopsy forceps. Resected and                            retrieved.                           - One 7 mm polyp in the descending colon, removed                            with a cold snare. Resected and retrieved.                           - The examination was otherwise normal on direct                            and retroflexion views.                           - Personal history of colonic polyps. Recommendation:           - Patient has a contact number available for                            emergencies. The signs and symptoms of potential                            delayed complications were discussed with the                            patient. Return to normal activities tomorrow.                            Written discharge instructions were provided to the                            patient.                           - Resume previous diet.                           - Continue present medications.                           -  Patient has a contact number available for                            emergencies. The signs and symptoms of potential                             delayed complications were discussed with the                            patient. Return to normal activities tomorrow.                            Written discharge instructions were provided to the                            patient.                           - Clear liquids x 1 hour then soft foods rest of                            day. Start prior diet tomorrow.                           - Continue present medications.                           - Repeat colonoscopy is recommended for                            surveillance. The colonoscopy date will be                            determined after pathology results from today's                            exam become available for review. Gatha Mayer, MD 10/18/2015 2:54:39 PM This report has been signed electronically.

## 2015-10-18 NOTE — Progress Notes (Signed)
Pt. Stated that he has a skin problem,went to doctor and as told he has a chemical imbalance and steroids was prescribed but pt. Has not picked up medications. Red patchy   rash noted to arms bilateral,pt. Denies itching or pain.

## 2015-10-18 NOTE — Progress Notes (Signed)
TO PACU  Pt awake and alert. Report to RN 

## 2015-10-18 NOTE — Op Note (Signed)
[place] Patient Name: Patrick Kidd Procedure Date: 10/18/2015 2:03 PM MRN: [id number] Endoscopist: Gatha Mayer , MD Age: 70 Referring MD:  Date of Birth: [birth year] Gender: Male Account #: [id number] Procedure:                Upper GI endoscopy Indications:              Dysphagia, Cirrhosis rule out esophageal varices Medicines:                Propofol per Anesthesia, Monitored Anesthesia Care Procedure:                Pre-Anesthesia Assessment:                           - Prior to the procedure, a History and Physical                            was performed, and patient medications and                            allergies were reviewed. The patient's tolerance of                            previous anesthesia was also reviewed. The risks                            and benefits of the procedure and the sedation                            options and risks were discussed with the patient.                            All questions were answered, and informed consent                            was obtained. Prior Anticoagulants: The patient has                            taken no previous anticoagulant or antiplatelet                            agents. ASA Grade Assessment: III - A patient with                            severe systemic disease. After reviewing the risks                            and benefits, the patient was deemed in                            satisfactory condition to undergo the procedure.                           After obtaining informed consent, the endoscope was  passed under direct vision. Throughout the                            procedure, the patient's blood pressure, pulse, and                            oxygen saturations were monitored continuously. The                            Model GIF-HQ190 [id number]) scope was introduced                            through the mouth, and advanced to the second part                          of duodenum. The upper GI endoscopy was                            accomplished without difficulty. The patient                            tolerated the procedure well. Scope In: Scope Out: Findings:                 One mild benign-appearing, intrinsic stenosis was                            found at the gastroesophageal junction. And was                            traversed. The scope was withdrawn. Dilation was                            performed with a Maloney dilator with no resistance                            at 67 Fr. Estimated blood loss: none.                           Patchy moderate inflammation characterized by                            congestion (edema), erythema and granularity was                            found in the prepyloric region of the stomach.                            Biopsies were taken with a cold forceps for                            histology. Verification of patient identification                            for the specimen was done. Estimated  blood loss was                            minimal.                           The exam was otherwise without abnormality.                           The cardia and gastric fundus were normal on                            retroflexion. Complications:            No immediate complications. Estimated Blood Loss:     Estimated blood loss was minimal. Impression:               - Benign-appearing esophageal stenosis. Dilated.                           - Gastritis. Biopsied.                           - The examination was otherwise normal. Recommendation:           - Patient has a contact number available for                            emergencies. The signs and symptoms of potential                            delayed complications were discussed with the                            patient. Return to normal activities tomorrow.                            Written discharge instructions were provided  to the                            patient.                           - Clear liquids x 1 hour then soft foods rest of                            day. Start prior diet tomorrow.                           - Continue present medications.                           - See the other procedure note for documentation of                            additional recommendations. Gatha Mayer, MD 10/18/2015 2:50:06 PM This report has been signed electronically.

## 2015-10-19 ENCOUNTER — Telehealth: Payer: Self-pay

## 2015-10-19 NOTE — Telephone Encounter (Signed)
  Follow up Call-  Call back number 10/18/2015  Post procedure Call Back phone  # [phone]  Permission to leave phone message Yes  Some recent data might be hidden     Patient questions:  Do you have a fever, pain , or abdominal swelling? No. Pain Score  0 *  Have you tolerated food without any problems? Yes.    Have you been able to return to your normal activities? Yes.    Do you have any questions about your discharge instructions: Diet   No. Medications  No. Follow up visit  No.  Do you have questions or concerns about your Care? No.  Actions: * If pain score is 4 or above: No action needed, pain <4.

## 2015-10-24 ENCOUNTER — Ambulatory Visit
Admission: RE | Admit: 2015-10-24 | Discharge: 2015-10-24 | Disposition: A | Discharge status: Home / Self Care | Payer: Medicare Other | Source: Ambulatory Visit | Attending: Family | Admitting: Family

## 2015-10-24 DIAGNOSIS — K7460 Unspecified cirrhosis of liver: Secondary | ICD-10-CM | POA: Diagnosis not present

## 2015-10-24 MED ORDER — GADOXETATE DISODIUM 0.25 MMOL/ML IV SOLN
10.0000 mL | Freq: Once | INTRAVENOUS | Status: AC | PRN
  Administered 2015-10-24: 10 mL via INTRAVENOUS

## 2015-10-25 ENCOUNTER — Other Ambulatory Visit: Payer: Self-pay | Admitting: Neurology

## 2015-10-25 NOTE — Telephone Encounter (Signed)
Try taking gabapentin 2 pills four times daily

## 2015-10-26 ENCOUNTER — Other Ambulatory Visit: Payer: Self-pay

## 2015-10-26 DIAGNOSIS — K7030 Alcoholic cirrhosis of liver without ascites: Secondary | ICD-10-CM

## 2015-10-26 DIAGNOSIS — Z23 Encounter for immunization: Secondary | ICD-10-CM

## 2015-10-26 NOTE — Progress Notes (Signed)
EGD recall - (cirrhosis/varices screen)and colon recall (3+ adenomas 34mm+ max) both 3 yrs 10/24/18 Will be calling from office with other results also no letter needed

## 2015-10-26 NOTE — Progress Notes (Signed)
Sheri,  Please call him 1) early cirrhosis no tumors on MR liver - good news but abstinence from alcohol remains my recommendation with hopes that will stop disease process  2) EGD bxs - gastritis - not a sig issue - recall 3 yrs [facility] placing) 3) colon polyps benign but precancerous - recall 3 yrs ([facility] placing) 4) next avail f/u me - check LFT's just before that 5) Initiate HBV and HAV vaccines - he is naive to both infections and has chronic liver disease

## 2015-11-02 ENCOUNTER — Ambulatory Visit (INDEPENDENT_AMBULATORY_CARE_PROVIDER_SITE_OTHER): Payer: Medicare Other | Admitting: Internal Medicine

## 2015-11-02 DIAGNOSIS — Z23 Encounter for immunization: Secondary | ICD-10-CM | POA: Diagnosis not present

## 2015-11-02 NOTE — Progress Notes (Signed)
Hepatitis A/B injection #1 given today.

## 2015-11-08 NOTE — Telephone Encounter (Signed)
DONE

## 2015-11-21 ENCOUNTER — Ambulatory Visit: Payer: Medicare Other | Admitting: Internal Medicine

## 2015-12-18 DIAGNOSIS — L920 Granuloma annulare: Secondary | ICD-10-CM | POA: Diagnosis not present

## 2015-12-25 LAB — HM DIABETES EYE EXAM

## 2016-01-02 ENCOUNTER — Other Ambulatory Visit (INDEPENDENT_AMBULATORY_CARE_PROVIDER_SITE_OTHER): Payer: Medicare Other

## 2016-01-02 DIAGNOSIS — K7030 Alcoholic cirrhosis of liver without ascites: Secondary | ICD-10-CM | POA: Diagnosis not present

## 2016-01-02 LAB — HEPATIC FUNCTION PANEL
ALT: 11 U/L (ref 0–53)
AST: 19 U/L (ref 0–37)
Albumin: 3.9 g/dL (ref 3.5–5.2)
Alkaline Phosphatase: 78 U/L (ref 39–117)
BILIRUBIN DIRECT: 0.3 mg/dL (ref 0.0–0.3)
BILIRUBIN TOTAL: 1.1 mg/dL (ref 0.2–1.2)
TOTAL PROTEIN: 7.3 g/dL (ref 6.0–8.3)

## 2016-01-03 NOTE — Progress Notes (Signed)
LFT's NL Will discuss at 12/1 visit

## 2016-01-04 ENCOUNTER — Ambulatory Visit (INDEPENDENT_AMBULATORY_CARE_PROVIDER_SITE_OTHER): Payer: Medicare Other | Admitting: Internal Medicine

## 2016-01-04 ENCOUNTER — Encounter: Payer: Self-pay | Admitting: Internal Medicine

## 2016-01-04 ENCOUNTER — Encounter (INDEPENDENT_AMBULATORY_CARE_PROVIDER_SITE_OTHER): Payer: Self-pay

## 2016-01-04 VITALS — BP 122/70 | HR 84 | Ht 74.0 in | Wt 258.4 lb

## 2016-01-04 DIAGNOSIS — R143 Flatulence: Secondary | ICD-10-CM | POA: Diagnosis not present

## 2016-01-04 DIAGNOSIS — R141 Gas pain: Secondary | ICD-10-CM

## 2016-01-04 DIAGNOSIS — R142 Eructation: Secondary | ICD-10-CM | POA: Diagnosis not present

## 2016-01-04 DIAGNOSIS — K7030 Alcoholic cirrhosis of liver without ascites: Secondary | ICD-10-CM

## 2016-01-04 NOTE — Progress Notes (Signed)
   Patrick Kidd 70 y.o. [birth year] [id number]  Assessment & Plan:   1. Alcoholic cirrhosis of liver without ascites ([place])   2. Flatulence, eructation and gas pain    I once again reminded the patient that it is in his best interest to be completely abstinent from alcohol. He was congratulated on reducing but reminded that though his liver function seems to be good at this time it could deteriorate quickly and that regular alcohol use may make that worse. He is not ready to stop drinking alcohol.  Regarding his gas pain I don't think it's the V8 or arms juice, he can continue simethicone and I have suggested a probiotic 1 month to see if that makes a difference.  He will return in 6 months. Sooner when necessary.  I appreciate the opportunity to care for this patient. CC: Mauricio Po, FNP  Subjective:   Chief Complaint: Follow-up of cirrhosis  HPI The patient is here for follow-up, initial office visit really but I have performed a colonoscopy and an EGD. He had CT scan in August that suggested cirrhosis of the liver and a low density lesion, an MRI was performed and it demonstrated a liver cyst and changes consistent with cirrhosis based upon the morphology of the liver. His synthetic function has been good overall, he has not had any known complications, he did not have varices or ascites but he continues to drink regularly. We had an extensive conversation about that at the time of his EGD and colonoscopy. He says he has cut his drinking and half down to 2 drinks plus one glass of wine every day. He is not ready to quit drinking completely. He did have a workup with hepatitis infection studies ferritin ANA and these were negative as to a cause. Liver chemistries were rechecked today and these are normal. His INR has been slightly elevated.  Lately he's been having recurrent abdominal gas cramps. His wife thinks it's from his V8 juice and arms juice and she took it away from  him and he wants me to say it's okay to drink those. He swears that he has not had regular problems with this. He will use some over-the-counter sign method [facility] from all gr with some relief. He belches and passes flatus a fair amount during the spells and he said this is similar to what he had an August would he went to the emergency room for where a CT scan was negative with respect to the GI tract other than the liver findings above.  Medications, allergies, past medical history, past surgical history, family history and social history are reviewed and updated in the EMR.  Review of Systems As above. He has granuloma annulare  Objective:   Physical Exam @BP  122/70   Pulse 84   Ht 6\' 2"  (1.88 m)   Wt 258 lb 6 oz (117.2 kg)   BMI 33.17 kg/m @  General:  NAD Eyes:   anicteric Lungs:  clear Heart::  S1S2 no rubs, murmurs or gallops Abdomen:  soft and nontender, BS+ Ext:   no edema, cyanosis or clubbing Skin shows changes of granuloma annualare on the extremities    Data Reviewed:  Lab Results  Component Value Date   ALT 11 01/02/2016   AST 19 01/02/2016   ALKPHOS 78 01/02/2016   BILITOT 1.1 01/02/2016

## 2016-01-04 NOTE — Patient Instructions (Signed)
   We have given you a handout on Align. This puts good bacteria back into your colon. You should take 1 capsule by mouth once daily.    Orange juice is okay to drink per Dr [name].    Talk to your PCP about your glucose intolerance.    Follow up with Dr [name] in 6 months.     I appreciate the opportunity to care for you. Silvano Rusk, MD, [facility]

## 2016-01-07 ENCOUNTER — Telehealth: Payer: Self-pay | Admitting: Internal Medicine

## 2016-01-07 ENCOUNTER — Encounter (HOSPITAL_COMMUNITY): Payer: Self-pay | Admitting: Emergency Medicine

## 2016-01-07 ENCOUNTER — Inpatient Hospital Stay (HOSPITAL_COMMUNITY)
Admission: EM | Admit: 2016-01-07 | Discharge: 2016-01-11 | DRG: 378 | Disposition: A | Discharge status: Home / Self Care | Payer: Medicare Other | Attending: Internal Medicine | Admitting: Internal Medicine

## 2016-01-07 DIAGNOSIS — Z23 Encounter for immunization: Secondary | ICD-10-CM | POA: Diagnosis not present

## 2016-01-07 DIAGNOSIS — Z7984 Long term (current) use of oral hypoglycemic drugs: Secondary | ICD-10-CM

## 2016-01-07 DIAGNOSIS — F1010 Alcohol abuse, uncomplicated: Secondary | ICD-10-CM | POA: Diagnosis present

## 2016-01-07 DIAGNOSIS — D696 Thrombocytopenia, unspecified: Secondary | ICD-10-CM | POA: Diagnosis present

## 2016-01-07 DIAGNOSIS — Z9841 Cataract extraction status, right eye: Secondary | ICD-10-CM | POA: Diagnosis not present

## 2016-01-07 DIAGNOSIS — Z86010 Personal history of colonic polyps: Secondary | ICD-10-CM

## 2016-01-07 DIAGNOSIS — K3189 Other diseases of stomach and duodenum: Secondary | ICD-10-CM | POA: Diagnosis not present

## 2016-01-07 DIAGNOSIS — K264 Chronic or unspecified duodenal ulcer with hemorrhage: Principal | ICD-10-CM | POA: Diagnosis present

## 2016-01-07 DIAGNOSIS — D6959 Other secondary thrombocytopenia: Secondary | ICD-10-CM | POA: Diagnosis present

## 2016-01-07 DIAGNOSIS — D62 Acute posthemorrhagic anemia: Secondary | ICD-10-CM | POA: Diagnosis present

## 2016-01-07 DIAGNOSIS — G629 Polyneuropathy, unspecified: Secondary | ICD-10-CM | POA: Diagnosis present

## 2016-01-07 DIAGNOSIS — D72829 Elevated white blood cell count, unspecified: Secondary | ICD-10-CM

## 2016-01-07 DIAGNOSIS — M19079 Primary osteoarthritis, unspecified ankle and foot: Secondary | ICD-10-CM | POA: Diagnosis present

## 2016-01-07 DIAGNOSIS — K219 Gastro-esophageal reflux disease without esophagitis: Secondary | ICD-10-CM | POA: Diagnosis present

## 2016-01-07 DIAGNOSIS — E1142 Type 2 diabetes mellitus with diabetic polyneuropathy: Secondary | ICD-10-CM | POA: Diagnosis present

## 2016-01-07 DIAGNOSIS — E663 Overweight: Secondary | ICD-10-CM | POA: Diagnosis present

## 2016-01-07 DIAGNOSIS — G4730 Sleep apnea, unspecified: Secondary | ICD-10-CM | POA: Diagnosis present

## 2016-01-07 DIAGNOSIS — K922 Gastrointestinal hemorrhage, unspecified: Secondary | ICD-10-CM | POA: Diagnosis not present

## 2016-01-07 DIAGNOSIS — Z806 Family history of leukemia: Secondary | ICD-10-CM | POA: Diagnosis not present

## 2016-01-07 DIAGNOSIS — Z87891 Personal history of nicotine dependence: Secondary | ICD-10-CM

## 2016-01-07 DIAGNOSIS — K921 Melena: Secondary | ICD-10-CM | POA: Diagnosis not present

## 2016-01-07 DIAGNOSIS — Z6830 Body mass index (BMI) 30.0-30.9, adult: Secondary | ICD-10-CM

## 2016-01-07 DIAGNOSIS — K766 Portal hypertension: Secondary | ICD-10-CM | POA: Diagnosis present

## 2016-01-07 DIAGNOSIS — Z9842 Cataract extraction status, left eye: Secondary | ICD-10-CM | POA: Diagnosis not present

## 2016-01-07 DIAGNOSIS — Z79899 Other long term (current) drug therapy: Secondary | ICD-10-CM | POA: Diagnosis not present

## 2016-01-07 DIAGNOSIS — M109 Gout, unspecified: Secondary | ICD-10-CM | POA: Diagnosis present

## 2016-01-07 DIAGNOSIS — Z794 Long term (current) use of insulin: Secondary | ICD-10-CM

## 2016-01-07 DIAGNOSIS — K2950 Unspecified chronic gastritis without bleeding: Secondary | ICD-10-CM | POA: Diagnosis not present

## 2016-01-07 DIAGNOSIS — Z801 Family history of malignant neoplasm of trachea, bronchus and lung: Secondary | ICD-10-CM | POA: Diagnosis not present

## 2016-01-07 DIAGNOSIS — E118 Type 2 diabetes mellitus with unspecified complications: Secondary | ICD-10-CM

## 2016-01-07 DIAGNOSIS — I10 Essential (primary) hypertension: Secondary | ICD-10-CM | POA: Diagnosis present

## 2016-01-07 DIAGNOSIS — J9811 Atelectasis: Secondary | ICD-10-CM | POA: Diagnosis not present

## 2016-01-07 DIAGNOSIS — K920 Hematemesis: Secondary | ICD-10-CM | POA: Diagnosis not present

## 2016-01-07 DIAGNOSIS — K7030 Alcoholic cirrhosis of liver without ascites: Secondary | ICD-10-CM | POA: Diagnosis not present

## 2016-01-07 LAB — COMPREHENSIVE METABOLIC PANEL
ALT: 17 U/L (ref 17–63)
AST: 24 U/L (ref 15–41)
Albumin: 3.6 g/dL (ref 3.5–5.0)
Alkaline Phosphatase: 55 U/L (ref 38–126)
Anion gap: 12 (ref 5–15)
BUN: 41 mg/dL — AB (ref 6–20)
CHLORIDE: 100 mmol/L — AB (ref 101–111)
CO2: 24 mmol/L (ref 22–32)
CREATININE: 1.02 mg/dL (ref 0.61–1.24)
Calcium: 8.7 mg/dL — ABNORMAL LOW (ref 8.9–10.3)
Glucose, Bld: 381 mg/dL — ABNORMAL HIGH (ref 65–99)
POTASSIUM: 5.2 mmol/L — AB (ref 3.5–5.1)
SODIUM: 136 mmol/L (ref 135–145)
Total Bilirubin: 1.5 mg/dL — ABNORMAL HIGH (ref 0.3–1.2)
Total Protein: 6.4 g/dL — ABNORMAL LOW (ref 6.5–8.1)

## 2016-01-07 LAB — PROTIME-INR
INR: 1.22
PROTHROMBIN TIME: 15.5 s — AB (ref 11.4–15.2)

## 2016-01-07 LAB — CBC
HEMATOCRIT: 31.6 % — AB (ref 39.0–52.0)
Hemoglobin: 10.3 g/dL — ABNORMAL LOW (ref 13.0–17.0)
MCH: 28.2 pg (ref 26.0–34.0)
MCHC: 32.6 g/dL (ref 30.0–36.0)
MCV: 86.6 fL (ref 78.0–100.0)
PLATELETS: 109 10*3/uL — AB (ref 150–400)
RBC: 3.65 MIL/uL — AB (ref 4.22–5.81)
RDW: 17.7 % — ABNORMAL HIGH (ref 11.5–15.5)
WBC: 10.4 10*3/uL (ref 4.0–10.5)

## 2016-01-07 LAB — ABO/RH: ABO/RH(D): O POS

## 2016-01-07 LAB — TYPE AND SCREEN
ABO/RH(D): O POS
Antibody Screen: NEGATIVE

## 2016-01-07 MED ORDER — SODIUM CHLORIDE 0.9 % IV SOLN
80.0000 mg | Freq: Once | INTRAVENOUS | Status: AC
  Administered 2016-01-07: 80 mg via INTRAVENOUS
  Filled 2016-01-07: qty 80

## 2016-01-07 MED ORDER — SODIUM CHLORIDE 0.9 % IV SOLN
8.0000 mg/h | INTRAVENOUS | Status: AC
  Administered 2016-01-07 – 2016-01-10 (×4): 8 mg/h via INTRAVENOUS
  Filled 2016-01-07 (×13): qty 80

## 2016-01-07 NOTE — Telephone Encounter (Signed)
Patient notified He verbalized understanding not to drive himself and to go to ED now

## 2016-01-07 NOTE — H&P (Addendum)
Patrick Kidd [id number] DOB: [birth year] DOA: 01/07/2016     PCP: Mauricio Po, FNP   Outpatient Specialists: Delrae Rend  Patient coming from:    home Lives   With family    Chief Complaint: melena blood in stool, hemaemesis  HPI: Patrick Kidd is a 70 y.o. male with medical history significant of alcoholic cirrhosis, DM 2, HTN     Presented with 2 day history of blood per rectum and one episode of vomiting up blood today. Patient had normal BM yesterday and then had some blood per rectum after that. Reports shortness of breath and slightly lightheaded with standing up.  he called up to the office and was recommended to go to emergency department. Reports some epigastric pain. Reports drinks 2-3 times a night vodka and soda. Reports he slowed down a lot.  Reports no hx of tremors or withdrawal. Last EtOH drink was last night.  Last bloody BP was 4:30pm  She was seen 3 days ago by GI secondary to persistent belching and gas pains he was started on probiotic.   Patient not on any anticoagulation. No chest pain shortness of breath Regarding pertinent Chronic problems: Patient has known alcoholic cirrhosis followed by [facility]  [facility] recently endoscopy done that showed no varices Patient continues to drink not ready to stop drinking  IN ER:  Temp (24hrs), Avg:98.1 F (36.7 C), Min:97.9 F (36.6 C), Max:98.2 F (36.8 C)      RR 26 HR101 BP 156/63  K 5.2 Cr 1.02 BUN 41 Alb 3.6  Hg 10.3 at baseline INR 1.2   ER provider discussed case with: [facility] Dr. Rosana Hoes will see patient in consult in the morning recommends Protonix drip Following Medications were ordered in ER: Medications  pantoprazole (PROTONIX) 80 mg in sodium chloride 0.9 % 250 mL (0.32 mg/mL) infusion (8 mg/hr Intravenous New Bag/Given 01/07/16 1929)  pantoprazole (PROTONIX) 80 mg in sodium chloride 0.9 % 100 mL IVPB (0 mg Intravenous Stopped 01/07/16 1929)     Hospitalist was called for admission for Upper GI  bleed  Review of Systems:    Pertinent positives include: Hematemesis  shortness of breath at rest  dizziness,   Palpitations abdominal pain, nausea, vomiting,blood in stool,  melena,   Constitutional:  No weight loss, night sweats, Fevers, chills, fatigue, weight loss  HEENT:  No headaches, Difficulty swallowing,Tooth/dental problems,Sore throat,  No sneezing, itching, ear ache, nasal congestion, post nasal drip,  Cardio-vascular:  No chest pain, Orthopnea, PND, anasarca,.no Bilateral lower extremity swelling  GI:  No heartburn, indigestion,  diarrhea, change in bowel habits, loss of appetite, Resp:  no. No dyspnea on exertion, No excess mucus, no productive cough, No non-productive cough, No coughing up of blood.No change in color of mucus.No wheezing. Skin:  no rash or lesions. No jaundice GU:  no dysuria, change in color of urine, no urgency or frequency. No straining to urinate.  No flank pain.  Musculoskeletal:  No joint pain or no joint swelling. No decreased range of motion. No back pain.  Psych:  No change in mood or affect. No depression or anxiety. No memory loss.  Neuro: no localizing neurological complaints, no tingling, no weakness, no double vision, no gait abnormality, no slurred speech, no confusion  As per HPI otherwise 10 point review of systems negative.   Past Medical History: Past Medical History:  Diagnosis Date  . Acrophobia   . Alcoholic cirrhosis ([place]) 0000000  . Arthritis    foot  by big toe  . Cataract    removed both eyes  . Chronic cough    PMH of  . Diabetes mellitus without complication ([place])   . Diverticulosis 07-03-2010   Colonoscopy.   Marland Kitchen GERD (gastroesophageal reflux disease)   . Gout   . Granuloma annulare   . Hx of adenomatous colonic polyps 07-03-2010   Colonoscopy. F/U due 2017  . Hydrocele   . Nonspecific elevation of levels of transaminase or lactic acid dehydrogenase (LDH)   . Peripheral neuropathy ([place])   . Plantar  fasciitis    PMH of  . Sleep apnea    no cpap   Past Surgical History:  Procedure Laterality Date  . CATARACT EXTRACTION, BILATERAL  12/2011    Dr Gershon Crane  . COLONOSCOPY    . COLONOSCOPY W/ POLYPECTOMY  07/03/2010   2 adenomas, diverticulosis on right. Dr [name]  . FLEXIBLE SIGMOIDOSCOPY  2000  . POLYPECTOMY    . SIGMOIDOSCOPY    . WISDOM TOOTH EXTRACTION       Social History:  Ambulatory   Independently     reports that he quit smoking about 44 years ago. His smoking use included Cigarettes. He has a 12.00 pack-year smoking history. He has never used smokeless tobacco. He reports that he drinks about 12.6 oz of alcohol per week . He reports that he does not use drugs.  Allergies:  No Known Allergies     Family History:   Family History  Problem Relation Age of Onset  . Lung cancer Mother     smoker  . Leukemia Father     Acute myelocytic  . Diabetes Neg Hx   . Stroke Neg Hx   . Heart disease Neg Hx   . Colon cancer Neg Hx   . Colon polyps Neg Hx   . Esophageal cancer Neg Hx   . Rectal cancer Neg Hx   . Stomach cancer Neg Hx     Medications: Prior to Admission medications   Medication Sig Start Date End Date Taking? Authorizing Provider  famotidine (PEPCID) 20 MG tablet Take 20 mg by mouth at bedtime as needed for heartburn or indigestion.   Yes Historical Provider, MD  furosemide (LASIX) 40 MG tablet Take 1 tablet (40 mg total) by mouth as needed. 02/21/15  Yes [name], FNP  gabapentin (NEURONTIN) 300 MG capsule Take one capsule four times a day as needed.    Yes Historical Provider, MD  metFORMIN (GLUCOPHAGE-XR) 500 MG 24 hr tablet Take 1 tablet (500 mg total) by mouth daily with breakfast. 08/22/15  Yes [name], FNP  naproxen sodium (ANAPROX) 220 MG tablet Take 440 mg by mouth 2 (two) times daily with a meal.   Yes Historical Provider, MD  omeprazole (PRILOSEC) 20 MG capsule Take 20 mg by mouth daily as needed.   Yes Historical Provider, MD    Probiotic Product (ALIGN) 4 MG CAPS Take 4 mg by mouth daily.   Yes Historical Provider, MD  sildenafil (REVATIO) 20 MG tablet 2-5 qd prn Patient taking differently: Take 20-100 mg by mouth daily as needed (ED).  11/15/14  Yes Hendricks Limes, MD  simethicone (GAS-X) 80 MG chewable tablet Chew 1 tablet (80 mg total) by mouth every 6 (six) hours as needed for flatulence. 03/04/14  Yes Jeannett Senior, PA-C    Physical Exam: Patient Vitals for the past 24 hrs:  BP Temp Temp src Pulse Resp SpO2  01/07/16 1927 156/63 98.2 F (36.8  C) Oral 101 26 96 %  01/07/16 1741 164/68 - - 103 22 95 %  01/07/16 1717 149/90 97.9 F (36.6 C) Oral 111 16 99 %    1. General:  in No Acute distress 2. Psychological: Alert and  Oriented 3. Head/ENT:     Dry Mucous Membranes                          Head Non traumatic, neck supple                            Poor Dentition 4. SKIN:  decreased Skin turgor,  Skin clean Dry and intact no rash 5. Heart: Regular rate and rhythm no  Murmur, Rub or gallop 6. Lungs:   no wheezes or crackles   7. Abdomen: Soft,  non-tender, Non distended, obese 8. Lower extremities: no clubbing, cyanosis, or edema 9. Neurologically Grossly intact, moving all 4 extremities equally  10. MSK: Normal range of motion   body mass index is unknown because there is no height or weight on file.  Labs on Admission:   Labs on Admission: I have personally reviewed following labs and imaging studies  CBC:  Recent Labs Lab 01/07/16 1748  WBC 10.4  HGB 10.3*  HCT 31.6*  MCV 86.6  PLT 0000000*   Basic Metabolic Panel:  Recent Labs Lab 01/07/16 1748  NA 136  K 5.2*  CL 100*  CO2 24  GLUCOSE 381*  BUN 41*  CREATININE 1.02  CALCIUM 8.7*   GFR: Estimated Creatinine Clearance: 90.4 mL/min (by C-G formula based on SCr of 1.02 mg/dL). Liver Function Tests:  Recent Labs Lab 01/02/16 1405 01/07/16 1748  AST 19 24  ALT 11 17  ALKPHOS 78 55  BILITOT 1.1 1.5*  PROT 7.3  6.4*  ALBUMIN 3.9 3.6   No results for input(s): LIPASE, AMYLASE in the last 168 hours. No results for input(s): AMMONIA in the last 168 hours. Coagulation Profile:  Recent Labs Lab 01/07/16 1748  INR 1.22   Cardiac Enzymes: No results for input(s): CKTOTAL, CKMB, CKMBINDEX, TROPONINI in the last 168 hours. BNP (last 3 results) No results for input(s): PROBNP in the last 8760 hours. HbA1C: No results for input(s): HGBA1C in the last 72 hours. CBG: No results for input(s): GLUCAP in the last 168 hours. Lipid Profile: No results for input(s): CHOL, HDL, LDLCALC, TRIG, CHOLHDL, LDLDIRECT in the last 72 hours. Thyroid Function Tests: No results for input(s): TSH, T4TOTAL, FREET4, T3FREE, THYROIDAB in the last 72 hours. Anemia Panel: No results for input(s): VITAMINB12, FOLATE, FERRITIN, TIBC, IRON, RETICCTPCT in the last 72 hours. Urine analysis:    Component Value Date/Time   COLORURINE YELLOW 11/15/2014 [place] 11/15/2014 1516   LABSPEC 1.020 11/15/2014 1516   PHURINE 6.5 11/15/2014 1516   GLUCOSEU NEGATIVE 11/15/2014 1516   HGBUR NEGATIVE 11/15/2014 1516   BILIRUBINUR NEGATIVE 11/15/2014 1516   KETONESUR 15 (A) 11/15/2014 1516   UROBILINOGEN 2.0 (A) 11/15/2014 1516   NITRITE NEGATIVE 11/15/2014 1516   LEUKOCYTESUR NEGATIVE 11/15/2014 1516   Sepsis Labs: @LABRCNTIP (procalcitonin:4,lacticidven:4) )No results found for this or any previous visit (from the past 240 hour(s)).    UA not ordered  Lab Results  Component Value Date   HGBA1C 6.7 (H) 08/16/2015    Estimated Creatinine Clearance: 90.4 mL/min (by C-G formula based on SCr of 1.02 mg/dL).  BNP (last 3 results)  No results for input(s): PROBNP in the last 8760 hours.   ECG REPORT  Independently reviewed Rate:102  Rhythm: sinus tachycardia ST&T Change: No acute ischemic changes   [id number]  There were no vitals filed for this visit.   Cultures: No results found for: [place], [place],  [place], REPTSTATUS   Radiological Exams on Admission: No results found.  Chart has been reviewed    Assessment/Plan   70 y.o. male with medical history significant of alcoholic cirrhosis, DM 2, HTN Being admitted for upper GI bleed   Present on Admission:   . GI bleedA she GI consult will continue Protonix drip serial CBC admit to step down given tachycardia and lightheadedness with upper GI blood loss most likely  . Alcoholic cirrhosis ([facility]) - Chronic abnormal LFTs only mildly elevated PT spoke about ankle importance of quitting alcohol patient states he is interested at this point Alcohol  abuse we will order CIWA . Thrombocytopenia ([facility])Chronic slightly secondary to liver sequestration versus underproduction secondary to alcohol abuse  DM 2 with a sliding scale patient is no longer taking metformin will hold Other plan as per orders.  DVT prophylaxis:  SCD  Code Status:  FULL CODE   as per patient    Family Communication:   Family   at  Bedside  plan of care was discussed with  Wife  Disposition Plan:   To home once workup is complete and patient is stable     Diabetes coordinator     consulted                          Consults called: [facility]   Admission status:   inpatient      Level of care      SDU      I have spent a total of 56 min on this admission   Samuele Storey 01/07/2016, 10:08 PM    [facility]  Pager [phone]   after 2 AM please page floor coverage PA If 7AM-7PM, please contact the day team taking care of the patient  [email or web address]  Password [email or web address]

## 2016-01-07 NOTE — ED Provider Notes (Signed)
[facility] DEPT Provider Note   CSN: [id number] Arrival date & time: 01/07/16  1708     History   Chief Complaint Chief Complaint  Patient presents with  . GI Bleeding    HPI Patrick Kidd is a 70 y.o. male.  HPI Patient presents with GI bleed. States that yesterday he had some normal stool followed by red blood. Stool then became black. States that he also had some vomiting today and vomited up normal emesis with some blood. Has a history of alcoholic cirrhosis. Sees Dr. [name] for it. States it feels little lightheaded today. He is not on anticoagulation. Will take Advil however. Previous EGD has not shown varices. INR is reportedly mildly elevated in the past. Slight upper abdominal pain and slight dull chest pain after vomiting.   Past Medical History:  Diagnosis Date  . Acrophobia   . Alcoholic cirrhosis ([place]) 0000000  . Arthritis    foot by big toe  . Cataract    removed both eyes  . Chronic cough    PMH of  . Diabetes mellitus without complication ([place])   . Diverticulosis 07-03-2010   Colonoscopy.   Marland Kitchen GERD (gastroesophageal reflux disease)   . Gout   . Granuloma annulare   . Hx of adenomatous colonic polyps 07-03-2010   Colonoscopy. F/U due 2017  . Hydrocele   . Nonspecific elevation of levels of transaminase or lactic acid dehydrogenase (LDH)   . Peripheral neuropathy ([place])   . Plantar fasciitis    PMH of  . Sleep apnea    no cpap    Patient Active Problem List   Diagnosis Date Noted  . GI bleed 01/07/2016  . Alcoholic cirrhosis ([place]) 123456  . Liver cyst 10/04/2015  . Type 2 diabetes mellitus ([place]) 08/22/2015  . Rash and nonspecific skin eruption 08/22/2015  . Abdominal distention 08/22/2015  . Elevated PSA 11/18/2014  . Thrombocytopenia ([place]) 03/19/2014  . Hypersomnolence 03/19/2014  . Dyspnea 03/07/2014  . Plantar fasciitis of right foot 02/17/2014  . Obesity (BMI 30-39.9) 07/19/2013  . Peripheral neuropathy ([place]) 01/11/2013  .  Polyclonal gammopathy 01/11/2013  . Impaired glucose tolerance 01/11/2013  . Edema 01/11/2013  . Hx of adenomatous colonic polyps 07/16/2012  . Chronic cough 05/31/2010  . GERD 10/04/2009  . History of gout 08/03/2006  . ACROPHOBIA 07/10/2006    Past Surgical History:  Procedure Laterality Date  . CATARACT EXTRACTION, BILATERAL  12/2011    Dr Gershon Crane  . COLONOSCOPY    . COLONOSCOPY W/ POLYPECTOMY  07/03/2010   2 adenomas, diverticulosis on right. Dr [name]  . FLEXIBLE SIGMOIDOSCOPY  2000  . POLYPECTOMY    . SIGMOIDOSCOPY    . WISDOM TOOTH EXTRACTION         Home Medications    Prior to Admission medications   Medication Sig Start Date End Date Taking? Authorizing Provider  famotidine (PEPCID) 20 MG tablet Take 20 mg by mouth at bedtime as needed for heartburn or indigestion.   Yes Historical Provider, MD  furosemide (LASIX) 40 MG tablet Take 1 tablet (40 mg total) by mouth as needed. 02/21/15  Yes [name], FNP  gabapentin (NEURONTIN) 300 MG capsule Take one capsule four times a day as needed.    Yes Historical Provider, MD  metFORMIN (GLUCOPHAGE-XR) 500 MG 24 hr tablet Take 1 tablet (500 mg total) by mouth daily with breakfast. 08/22/15  Yes [name], FNP  naproxen sodium (ANAPROX) 220 MG tablet Take 440 mg by mouth  2 (two) times daily with a meal.   Yes Historical Provider, MD  omeprazole (PRILOSEC) 20 MG capsule Take 20 mg by mouth daily as needed.   Yes Historical Provider, MD  Probiotic Product (ALIGN) 4 MG CAPS Take 4 mg by mouth daily.   Yes Historical Provider, MD  sildenafil (REVATIO) 20 MG tablet 2-5 qd prn Patient taking differently: Take 20-100 mg by mouth daily as needed (ED).  11/15/14  Yes Hendricks Limes, MD  simethicone (GAS-X) 80 MG chewable tablet Chew 1 tablet (80 mg total) by mouth every 6 (six) hours as needed for flatulence. 03/04/14  Yes Jeannett Senior, PA-C    Family History Family History  Problem Relation Age of Onset  . Lung  cancer Mother     smoker  . Leukemia Father     Acute myelocytic  . Diabetes Neg Hx   . Stroke Neg Hx   . Heart disease Neg Hx   . Colon cancer Neg Hx   . Colon polyps Neg Hx   . Esophageal cancer Neg Hx   . Rectal cancer Neg Hx   . Stomach cancer Neg Hx     Social History Social History  Substance Use Topics  . Smoking status: Former Smoker    Packs/day: 2.00    Years: 6.00    Types: Cigarettes    Quit date: 02/04/1971  . Smokeless tobacco: Never Used     Comment: smoked age [age], up to 2 ppd  . Alcohol use 12.6 oz/week    21 Shots of liquor per week     Comment: 2-3 a day     Allergies   Patient has no known allergies.   Review of Systems Review of Systems  Constitutional: Negative for appetite change and fever.  HENT: Negative for congestion.   Respiratory: Negative for shortness of breath.   Cardiovascular: Positive for chest pain.  Gastrointestinal: Positive for abdominal pain, blood in stool, nausea and vomiting. Negative for diarrhea.  Endocrine: Negative for polyuria.  Genitourinary: Negative for flank pain.  Musculoskeletal: Negative for back pain.  Skin: Negative for wound.  Neurological: Positive for light-headedness.  Hematological: Does not bruise/bleed easily.  Psychiatric/Behavioral: Negative for agitation.     Physical Exam Updated Vital Signs BP 119/73 (BP Location: Right Arm)   Pulse 102   Temp 98.2 F (36.8 C) (Oral)   Resp (!) 27   SpO2 99%   Physical Exam  Constitutional: He appears well-developed.  HENT:  Head: Atraumatic.  Eyes: Pupils are equal, round, and reactive to light. No scleral icterus.  Neck: Neck supple.  Cardiovascular: Normal rate.   Pulmonary/Chest: Effort normal.  Abdominal: Soft. There is tenderness.  Mild epigastric tenderness without rebound or guarding.  Musculoskeletal: Normal range of motion. He exhibits no edema.  Neurological: He is alert.  Skin: Skin is warm. Capillary refill takes less than 2  seconds. No pallor.     ED Treatments / Results  Labs (all labs ordered are listed, but only abnormal results are displayed) Labs Reviewed  COMPREHENSIVE METABOLIC PANEL - Abnormal; Notable for the following:       Result Value   Potassium 5.2 (*)    Chloride 100 (*)    Glucose, Bld 381 (*)    BUN 41 (*)    Calcium 8.7 (*)    Total Protein 6.4 (*)    Total Bilirubin 1.5 (*)    All other components within normal limits  CBC - Abnormal; Notable for the following:  RBC 3.65 (*)    Hemoglobin 10.3 (*)    HCT 31.6 (*)    RDW 17.7 (*)    Platelets 109 (*)    All other components within normal limits  PROTIME-INR - Abnormal; Notable for the following:    Prothrombin Time 15.5 (*)    All other components within normal limits  TYPE AND SCREEN  ABO/RH    EKG  EKG Interpretation  Date/Time:  Monday January 07 2016 17:37:21 EST Ventricular Rate:  102 PR Interval:    QRS Duration: 94 QT Interval:  355 QTC Calculation: 463 R Axis:   67 Text Interpretation:  Sinus tachycardia Confirmed by Alvino Chapel  MD, Ovid Curd [phone]) on 01/07/2016 5:56:08 PM       Radiology No results found.  Procedures Procedures (including critical care time)  Medications Ordered in ED Medications  pantoprazole (PROTONIX) 80 mg in sodium chloride 0.9 % 250 mL (0.32 mg/mL) infusion (8 mg/hr Intravenous New Bag/Given 01/07/16 1929)  pantoprazole (PROTONIX) 80 mg in sodium chloride 0.9 % 100 mL IVPB (0 mg Intravenous Stopped 01/07/16 1929)     Initial Impression / Assessment and Plan / ED Course  I have reviewed the triage vital signs and the nursing notes.  Pertinent labs & imaging results that were available during my care of the patient were reviewed by me and considered in my medical decision making (see chart for details).  Clinical Course     Patient presents with GI bleeding. History of cirrhosis from alcohol. Has melena on rectal exam. Hemoglobin mildly decreased but near baseline. I  expect this to drop more. INR is not elevated to discuss with GI, who will see the patient tomorrow. Start on Protonix drip. Patient is symptomatic with it. Will admit to stepdown.  Final Clinical Impressions(s) / ED Diagnoses   Final diagnoses:  Acute GI bleeding    New Prescriptions New Prescriptions   No medications on file     Davonna Belling, MD 01/07/16 2055

## 2016-01-07 NOTE — ED Notes (Signed)
Noah Delaine RN on ICU advised room was ready and nurse could call report.

## 2016-01-07 NOTE — Telephone Encounter (Signed)
Patient reports that he had rectal bleeding yesterday and has had an episode ov vomiting black substance today.  He denies bismuth products or iron.  He is a little SOB and says he has to change positions slowly.  Please advise

## 2016-01-07 NOTE — Telephone Encounter (Signed)
Do not think its the probiotic  Given his liver disease - cirrhosis and these sxs ED visit appropriate that is my recommendation

## 2016-01-07 NOTE — ED Triage Notes (Signed)
Pt seen by PCP on Friday, given probiotics. Gas on Sunday, blood in stool. Felt worse today, black liquid emesis today, black stool today. Weak, dizzy on standing. No anticoagulants. Takes Advil.

## 2016-01-08 ENCOUNTER — Inpatient Hospital Stay (HOSPITAL_COMMUNITY): Payer: Medicare Other | Admitting: Certified Registered Nurse Anesthetist

## 2016-01-08 ENCOUNTER — Encounter (HOSPITAL_COMMUNITY)
Admission: EM | Disposition: A | Discharge status: Home / Self Care | Payer: Self-pay | Source: Home / Self Care | Attending: Internal Medicine

## 2016-01-08 ENCOUNTER — Inpatient Hospital Stay (HOSPITAL_COMMUNITY): Payer: Medicare Other

## 2016-01-08 ENCOUNTER — Encounter (HOSPITAL_COMMUNITY): Payer: Self-pay

## 2016-01-08 DIAGNOSIS — K7030 Alcoholic cirrhosis of liver without ascites: Secondary | ICD-10-CM

## 2016-01-08 DIAGNOSIS — K921 Melena: Secondary | ICD-10-CM

## 2016-01-08 DIAGNOSIS — K3189 Other diseases of stomach and duodenum: Secondary | ICD-10-CM

## 2016-01-08 DIAGNOSIS — K922 Gastrointestinal hemorrhage, unspecified: Secondary | ICD-10-CM

## 2016-01-08 DIAGNOSIS — K264 Chronic or unspecified duodenal ulcer with hemorrhage: Secondary | ICD-10-CM | POA: Diagnosis present

## 2016-01-08 DIAGNOSIS — K920 Hematemesis: Secondary | ICD-10-CM

## 2016-01-08 HISTORY — PX: ESOPHAGOGASTRODUODENOSCOPY (EGD) WITH PROPOFOL: SHX5813

## 2016-01-08 LAB — COMPREHENSIVE METABOLIC PANEL
ALBUMIN: 3.5 g/dL (ref 3.5–5.0)
ALT: 16 U/L — ABNORMAL LOW (ref 17–63)
ANION GAP: 13 (ref 5–15)
AST: 26 U/L (ref 15–41)
Alkaline Phosphatase: 56 U/L (ref 38–126)
BUN: 52 mg/dL — ABNORMAL HIGH (ref 6–20)
CO2: 22 mmol/L (ref 22–32)
Calcium: 8.8 mg/dL — ABNORMAL LOW (ref 8.9–10.3)
Chloride: 102 mmol/L (ref 101–111)
Creatinine, Ser: 1.08 mg/dL (ref 0.61–1.24)
GFR calc Af Amer: >60 mL/min (ref >60)
GFR calc non Af Amer: >60 mL/min (ref >60)
GLUCOSE: 407 mg/dL — AB (ref 65–99)
POTASSIUM: 4.5 mmol/L (ref 3.5–5.1)
SODIUM: 137 mmol/L (ref 135–145)
Total Bilirubin: 1.5 mg/dL — ABNORMAL HIGH (ref 0.3–1.2)
Total Protein: 6.6 g/dL (ref 6.5–8.1)

## 2016-01-08 LAB — URINALYSIS, ROUTINE W REFLEX MICROSCOPIC
Bacteria, UA: NONE SEEN
Bilirubin Urine: NEGATIVE
Hgb urine dipstick: NEGATIVE
KETONES UR: NEGATIVE mg/dL
Leukocytes, UA: NEGATIVE
NITRITE: NEGATIVE
Protein, ur: NEGATIVE mg/dL
RBC / HPF: NONE SEEN RBC/hpf (ref 0–5)
Specific Gravity, Urine: 1.021 (ref 1.005–1.030)
Squamous Epithelial / LPF: NONE SEEN
pH: 5 (ref 5.0–8.0)

## 2016-01-08 LAB — GLUCOSE, CAPILLARY
GLUCOSE-CAPILLARY: 191 mg/dL — AB (ref 65–99)
GLUCOSE-CAPILLARY: 236 mg/dL — AB (ref 65–99)
GLUCOSE-CAPILLARY: 289 mg/dL — AB (ref 65–99)
GLUCOSE-CAPILLARY: 312 mg/dL — AB (ref 65–99)
GLUCOSE-CAPILLARY: 374 mg/dL — AB (ref 65–99)
Glucose-Capillary: 294 mg/dL — ABNORMAL HIGH (ref 65–99)
Glucose-Capillary: 304 mg/dL — ABNORMAL HIGH (ref 65–99)
Glucose-Capillary: 413 mg/dL — ABNORMAL HIGH (ref 65–99)

## 2016-01-08 LAB — CBC
HCT: 26 % — ABNORMAL LOW (ref 39.0–52.0)
HCT: 23.8 % — ABNORMAL LOW (ref 39.0–52.0)
HEMATOCRIT: 29.3 % — AB (ref 39.0–52.0)
HEMATOCRIT: 24.9 % — AB (ref 39.0–52.0)
HEMOGLOBIN: 9.5 g/dL — AB (ref 13.0–17.0)
Hemoglobin: 8.6 g/dL — ABNORMAL LOW (ref 13.0–17.0)
Hemoglobin: 8.3 g/dL — ABNORMAL LOW (ref 13.0–17.0)
Hemoglobin: 7.8 g/dL — ABNORMAL LOW (ref 13.0–17.0)
MCH: 27.9 pg (ref 26.0–34.0)
MCH: 28.2 pg (ref 26.0–34.0)
MCH: 28.2 pg (ref 26.0–34.0)
MCH: 28.6 pg (ref 26.0–34.0)
MCHC: 32.4 g/dL (ref 30.0–36.0)
MCHC: 33.1 g/dL (ref 30.0–36.0)
MCHC: 33.3 g/dL (ref 30.0–36.0)
MCHC: 32.8 g/dL (ref 30.0–36.0)
MCV: 86.2 fL (ref 78.0–100.0)
MCV: 85.2 fL (ref 78.0–100.0)
MCV: 84.7 fL (ref 78.0–100.0)
MCV: 87.2 fL (ref 78.0–100.0)
PLATELETS: 89 10*3/uL — AB (ref 150–400)
PLATELETS: 75 10*3/uL — AB (ref 150–400)
Platelets: 131 10*3/uL — ABNORMAL LOW (ref 150–400)
Platelets: 91 10*3/uL — ABNORMAL LOW (ref 150–400)
RBC: 3.4 MIL/uL — ABNORMAL LOW (ref 4.22–5.81)
RBC: 3.05 MIL/uL — AB (ref 4.22–5.81)
RBC: 2.94 MIL/uL — ABNORMAL LOW (ref 4.22–5.81)
RBC: 2.73 MIL/uL — AB (ref 4.22–5.81)
RDW: 17.9 % — ABNORMAL HIGH (ref 11.5–15.5)
RDW: 18.2 % — ABNORMAL HIGH (ref 11.5–15.5)
RDW: 18.4 % — AB (ref 11.5–15.5)
RDW: 18.7 % — AB (ref 11.5–15.5)
WBC: 11.4 10*3/uL — ABNORMAL HIGH (ref 4.0–10.5)
WBC: 8.8 10*3/uL (ref 4.0–10.5)
WBC: 5.9 10*3/uL (ref 4.0–10.5)
WBC: 6 10*3/uL (ref 4.0–10.5)

## 2016-01-08 LAB — PHOSPHORUS: PHOSPHORUS: 2.6 mg/dL (ref 2.5–4.6)

## 2016-01-08 LAB — TSH: TSH: 1.525 u[IU]/mL (ref 0.350–4.500)

## 2016-01-08 LAB — PROTIME-INR
INR: 1.22
PROTHROMBIN TIME: 15.5 s — AB (ref 11.4–15.2)

## 2016-01-08 LAB — MRSA PCR SCREENING: MRSA by PCR: NEGATIVE

## 2016-01-08 LAB — MAGNESIUM: MAGNESIUM: 1.7 mg/dL (ref 1.7–2.4)

## 2016-01-08 SURGERY — ESOPHAGOGASTRODUODENOSCOPY (EGD) WITH PROPOFOL
Anesthesia: Monitor Anesthesia Care

## 2016-01-08 MED ORDER — SODIUM CHLORIDE 0.9 % IV SOLN
INTRAVENOUS | Status: AC
  Administered 2016-01-08: 01:00:00 via INTRAVENOUS

## 2016-01-08 MED ORDER — LORAZEPAM 1 MG PO TABS: 1.0000 mg | ORAL_TABLET | Freq: Four times a day (QID) | ORAL | Status: AC | PRN

## 2016-01-08 MED ORDER — LORAZEPAM 2 MG/ML IJ SOLN: 1.0000 mg | Freq: Four times a day (QID) | INTRAMUSCULAR | Status: AC | PRN

## 2016-01-08 MED ORDER — ONDANSETRON HCL 4 MG/2ML IJ SOLN
INTRAMUSCULAR | Status: DC | PRN
  Administered 2016-01-08: 4 mg via INTRAVENOUS

## 2016-01-08 MED ORDER — ACETAMINOPHEN 650 MG RE SUPP: 650.0000 mg | Freq: Four times a day (QID) | RECTAL | Status: DC | PRN

## 2016-01-08 MED ORDER — ONDANSETRON HCL 4 MG PO TABS: 4.0000 mg | ORAL_TABLET | Freq: Four times a day (QID) | ORAL | Status: DC | PRN

## 2016-01-08 MED ORDER — FOLIC ACID 1 MG PO TABS
1.0000 mg | ORAL_TABLET | Freq: Every day | ORAL | Status: DC
  Administered 2016-01-09 – 2016-01-11 (×3): 1 mg via ORAL
  Filled 2016-01-08 (×3): qty 1

## 2016-01-08 MED ORDER — LORAZEPAM 2 MG/ML IJ SOLN: 0.0000 mg | Freq: Two times a day (BID) | INTRAMUSCULAR | Status: DC

## 2016-01-08 MED ORDER — INSULIN ASPART 100 UNIT/ML ~~LOC~~ SOLN: 10.0000 [IU] | Freq: Once | SUBCUTANEOUS | Status: AC

## 2016-01-08 MED ORDER — EPINEPHRINE PF 1 MG/10ML IJ SOSY
PREFILLED_SYRINGE | INTRAMUSCULAR | Status: AC
  Filled 2016-01-08: qty 10

## 2016-01-08 MED ORDER — SODIUM CHLORIDE 0.9% FLUSH
3.0000 mL | Freq: Two times a day (BID) | INTRAVENOUS | Status: DC
  Administered 2016-01-08 – 2016-01-10 (×5): 3 mL via INTRAVENOUS

## 2016-01-08 MED ORDER — LORAZEPAM 2 MG/ML IJ SOLN: 0.0000 mg | Freq: Four times a day (QID) | INTRAMUSCULAR | Status: AC

## 2016-01-08 MED ORDER — PROPOFOL 500 MG/50ML IV EMUL
INTRAVENOUS | Status: DC | PRN
  Administered 2016-01-08: 100 ug/kg/min via INTRAVENOUS

## 2016-01-08 MED ORDER — VITAMIN B-1 100 MG PO TABS
100.0000 mg | ORAL_TABLET | Freq: Every day | ORAL | Status: DC
Start: 2016-01-08 — End: 2016-01-11
  Administered 2016-01-09 – 2016-01-11 (×3): 100 mg via ORAL
  Filled 2016-01-08 (×3): qty 1

## 2016-01-08 MED ORDER — PROPOFOL 10 MG/ML IV BOLUS
INTRAVENOUS | Status: DC | PRN
  Administered 2016-01-08 (×2): 40 mg via INTRAVENOUS
  Administered 2016-01-08 (×2): 20 mg via INTRAVENOUS
  Administered 2016-01-08: 30 mg via INTRAVENOUS

## 2016-01-08 MED ORDER — EPINEPHRINE PF 1 MG/10ML IJ SOSY
PREFILLED_SYRINGE | INTRAMUSCULAR | Status: DC | PRN
  Administered 2016-01-08: 3 mL

## 2016-01-08 MED ORDER — ADULT MULTIVITAMIN W/MINERALS CH
1.0000 | ORAL_TABLET | Freq: Every day | ORAL | Status: DC
  Administered 2016-01-09 – 2016-01-11 (×3): 1 via ORAL
  Filled 2016-01-08 (×3): qty 1

## 2016-01-08 MED ORDER — HYDROCODONE-ACETAMINOPHEN 5-325 MG PO TABS
1.0000 | ORAL_TABLET | ORAL | Status: DC | PRN
  Administered 2016-01-08: 1 via ORAL
  Filled 2016-01-08: qty 1

## 2016-01-08 MED ORDER — THIAMINE HCL 100 MG/ML IJ SOLN: 100.0000 mg | Freq: Every day | INTRAMUSCULAR | Status: DC

## 2016-01-08 MED ORDER — ONDANSETRON HCL 4 MG/2ML IJ SOLN: 4.0000 mg | Freq: Four times a day (QID) | INTRAMUSCULAR | Status: DC | PRN

## 2016-01-08 MED ORDER — INSULIN ASPART 100 UNIT/ML ~~LOC~~ SOLN
0.0000 [IU] | SUBCUTANEOUS | Status: DC
  Administered 2016-01-08: 2 [IU] via SUBCUTANEOUS
  Administered 2016-01-08: 5 [IU] via SUBCUTANEOUS
  Administered 2016-01-08: 7 [IU] via SUBCUTANEOUS
  Administered 2016-01-08: 5 [IU] via SUBCUTANEOUS
  Administered 2016-01-08: 9 [IU] via SUBCUTANEOUS
  Administered 2016-01-08: 3 [IU] via SUBCUTANEOUS
  Administered 2016-01-09: 5 [IU] via SUBCUTANEOUS
  Administered 2016-01-09: 1 [IU] via SUBCUTANEOUS
  Administered 2016-01-09: 3 [IU] via SUBCUTANEOUS
  Administered 2016-01-09: 5 [IU] via SUBCUTANEOUS
  Administered 2016-01-09 – 2016-01-10 (×2): 1 [IU] via SUBCUTANEOUS
  Administered 2016-01-10 (×2): 5 [IU] via SUBCUTANEOUS
  Administered 2016-01-10: 2 [IU] via SUBCUTANEOUS
  Administered 2016-01-10: 1 [IU] via SUBCUTANEOUS
  Administered 2016-01-10: 3 [IU] via SUBCUTANEOUS
  Administered 2016-01-11: 1 [IU] via SUBCUTANEOUS
  Administered 2016-01-11: 2 [IU] via SUBCUTANEOUS

## 2016-01-08 MED ORDER — LIDOCAINE 2% (20 MG/ML) 5 ML SYRINGE
INTRAMUSCULAR | Status: DC | PRN
  Administered 2016-01-08: 100 mg via INTRAVENOUS

## 2016-01-08 MED ORDER — GABAPENTIN 300 MG PO CAPS
300.0000 mg | ORAL_CAPSULE | Freq: Every day | ORAL | Status: DC
  Administered 2016-01-08 – 2016-01-10 (×4): 300 mg via ORAL
  Filled 2016-01-08 (×4): qty 1

## 2016-01-08 MED ORDER — ACETAMINOPHEN 325 MG PO TABS: 650.0000 mg | ORAL_TABLET | Freq: Four times a day (QID) | ORAL | Status: DC | PRN

## 2016-01-08 SURGICAL SUPPLY — 14 items

## 2016-01-08 NOTE — Interval H&P Note (Signed)
History and Physical Interval Note:  01/08/2016 9:32 AM  Patrick Kidd  has presented today for surgery, with the diagnosis of GI bleed  The various methods of treatment have been discussed with the patient and family. After consideration of risks, benefits and other options for treatment, the patient has consented to  Procedure(s): ESOPHAGOGASTRODUODENOSCOPY (EGD) WITH PROPOFOL (N/A) as a surgical intervention .  The patient's history has been reviewed, patient examined, no change in status, stable for surgery.  I have reviewed the patient's chart and labs.  Questions were answered to the patient's satisfaction.     Pricilla Riffle. Fuller Plan

## 2016-01-08 NOTE — Consult Note (Signed)
Consultation  Referring Provider:  Dr. Grandville Silos    Primary Care Physician:  Mauricio Po, [place] Primary Gastroenterologist:    Dr. [name]     Reason for Consultation: Melena, Coffee-ground emesis, acute blood loss anemia         HPI:   Patrick Kidd is a 70 y.o. Caucasian male with a past medical history of alcoholic cirrhosis, diabetes, GERD, peripheral neuropathy and sleep apnea, who presented to the ED last night, 01/07/16 with a complaint of coffee-ground emesis, abdominal pain and melena.   Patient follows in our clinic with Dr. [name] and was last seen on 01/04/2016. At that time he was there for follow-up of his alcoholic cirrhosis of the liver without ascites and excess gas. He was told to remain completely abstinent from alcohol. He was told to continue simethicone and a probiotic was suggested.   Today, the patient tells me that he has been following with Dr. [name] for about 6 months now for excess gas and during this time was found to have cirrhosis on an MRI. On Sunday afternoon, 01/06/16 the patient had a bowel movement and behind this bowel movement was a lot of bright red blood, this was not in the stool. He describes that he didn't think much of this and on Monday around 2 PM was sitting in his chair and went to stand up and felt very "woozy". He sat right back down again and then felt the need to vomit. He threw up  outside his door twice. He tells me this was more of a "spit up". He does tell me this looked like "coffee grounds". He did not think much of this either as all he had to drink that day was coffee. About 30 minutes later the patient became very dizzy and when he started to walk was "listing". He called our office and was advised to proceed to the ER. He did have 1 melenic stool at home. He arrived at the ER around 4 PM and waited for "8.5 hrs". Since he has been in the hospital he has "gagged once", and had a "purple/brown" bowel movement around 1:00 this  morning. Associated symptoms include an epigastric and lower abdominal cramping pain, though the patient tells me this has not changed over the past 6 months.   Patient does use Prevacid 30 mg and/or Prilosec 40 mg when necessary, he has been doing this for some time, usually at least once a day. He tells me he was directed to do this. He also uses Pepcid at night occasionally. He does use at least of 2 Aleve per week for foot pain. He also continues to drink alcohol, though this is decreased from previously. He tells me he drinks 2 glasses of vodka per day and sometimes a glass of wine at night.   Patient denies fever, chills, syncope, shortness of breath, heartburn, reflux or dysphagia.  Past GI procedures: EGD-10/18/15-Dr. [name]: Benign-appearing esophageal stenosis which was dilated, gastritis and otherwise normal exam; pathology revealed mild chronic inactive gastritis ;recommended recall 3 years Colonoscopy-10/18/15-Dr. [name]: One 10 mm polyp in the ascending colon, three 1-2 mm polyps in the descending colon, one 7 mm polyp in the descending colon and otherwise normal exam; pathology revealed tubular adenoma recommended recall was 3 years  Past Medical History:  Diagnosis Date  . Acrophobia   . Alcoholic cirrhosis ([place]) 0000000  . Arthritis    foot by big toe  . Cataract    removed both eyes  .  Chronic cough    PMH of  . Diabetes mellitus without complication ([place])   . Diverticulosis 07-03-2010   Colonoscopy.   Marland Kitchen GERD (gastroesophageal reflux disease)   . Gout   . Granuloma annulare   . Hx of adenomatous colonic polyps 07-03-2010   Colonoscopy. F/U due 2017  . Hydrocele   . Nonspecific elevation of levels of transaminase or lactic acid dehydrogenase (LDH)   . Peripheral neuropathy ([place])   . Plantar fasciitis    PMH of  . Sleep apnea    no cpap    Past Surgical History:  Procedure Laterality Date  . CATARACT EXTRACTION, BILATERAL  12/2011    Dr Gershon Crane  . COLONOSCOPY      . COLONOSCOPY W/ POLYPECTOMY  07/03/2010   2 adenomas, diverticulosis on right. Dr [name]  . FLEXIBLE SIGMOIDOSCOPY  2000  . POLYPECTOMY    . SIGMOIDOSCOPY    . WISDOM TOOTH EXTRACTION      Family History  Problem Relation Age of Onset  . Lung cancer Mother     smoker  . Leukemia Father     Acute myelocytic  . Diabetes Neg Hx   . Stroke Neg Hx   . Heart disease Neg Hx   . Colon cancer Neg Hx   . Colon polyps Neg Hx   . Esophageal cancer Neg Hx   . Rectal cancer Neg Hx   . Stomach cancer Neg Hx     Social History  Substance Use Topics  . Smoking status: Former Smoker    Packs/day: 2.00    Years: 6.00    Types: Cigarettes    Quit date: 02/04/1971  . Smokeless tobacco: Never Used     Comment: smoked age [age], up to 2 ppd  . Alcohol use 12.6 oz/week    21 Shots of liquor per week     Comment: 2-3 a day    Prior to Admission medications   Medication Sig Start Date End Date Taking? Authorizing Provider  famotidine (PEPCID) 20 MG tablet Take 20 mg by mouth at bedtime as needed for heartburn or indigestion.   Yes Historical Provider, MD  furosemide (LASIX) 40 MG tablet Take 1 tablet (40 mg total) by mouth as needed. 02/21/15  Yes [name], FNP  gabapentin (NEURONTIN) 300 MG capsule Take one capsule four times a day as needed.    Yes Historical Provider, MD  metFORMIN (GLUCOPHAGE-XR) 500 MG 24 hr tablet Take 1 tablet (500 mg total) by mouth daily with breakfast. 08/22/15  Yes [name], FNP  naproxen sodium (ANAPROX) 220 MG tablet Take 440 mg by mouth 2 (two) times daily with a meal.   Yes Historical Provider, MD  omeprazole (PRILOSEC) 20 MG capsule Take 20 mg by mouth daily as needed.   Yes Historical Provider, MD  Probiotic Product (ALIGN) 4 MG CAPS Take 4 mg by mouth daily.   Yes Historical Provider, MD  sildenafil (REVATIO) 20 MG tablet 2-5 qd prn Patient taking differently: Take 20-100 mg by mouth daily as needed (ED).  11/15/14  Yes Hendricks Limes, MD   simethicone (GAS-X) 80 MG chewable tablet Chew 1 tablet (80 mg total) by mouth every 6 (six) hours as needed for flatulence. 03/04/14  Yes Tatyana Kirichenko, PA-C    Current Facility-Administered Medications  Medication Dose Route Frequency Provider Last Rate Last Dose  . 0.9 %  sodium chloride infusion   Intravenous Continuous Toy Baker, MD 100 mL/hr at 01/08/16 0045    .  acetaminophen (TYLENOL) tablet 650 mg  650 mg Oral Q6H PRN Toy Baker, MD       Or  . acetaminophen (TYLENOL) suppository 650 mg  650 mg Rectal Q6H PRN Toy Baker, MD      . folic acid (FOLVITE) tablet 1 mg  1 mg Oral Daily Toy Baker, MD      . gabapentin (NEURONTIN) capsule 300 mg  300 mg Oral QHS Toy Baker, MD   300 mg at 01/08/16 0052  . HYDROcodone-acetaminophen (NORCO/VICODIN) 5-325 MG per tablet 1-2 tablet  1-2 tablet Oral Q4H PRN Toy Baker, MD      . insulin aspart (novoLOG) injection 0-9 Units  0-9 Units Subcutaneous Q4H Toy Baker, MD   7 Units at 01/08/16 0750  . LORazepam (ATIVAN) injection 0-4 mg  0-4 mg Intravenous Q6H Toy Baker, MD       Followed by  . [START ON 01/10/2016] LORazepam (ATIVAN) injection 0-4 mg  0-4 mg Intravenous Q12H Anastassia Doutova, MD      . LORazepam (ATIVAN) tablet 1 mg  1 mg Oral Q6H PRN Toy Baker, MD       Or  . LORazepam (ATIVAN) injection 1 mg  1 mg Intravenous Q6H PRN Toy Baker, MD      . multivitamin with minerals tablet 1 tablet  1 tablet Oral Daily Toy Baker, MD      . ondansetron (ZOFRAN) tablet 4 mg  4 mg Oral Q6H PRN Toy Baker, MD       Or  . ondansetron (ZOFRAN) injection 4 mg  4 mg Intravenous Q6H PRN Toy Baker, MD      . pantoprazole (PROTONIX) 80 mg in sodium chloride 0.9 % 250 mL (0.32 mg/mL) infusion  8 mg/hr Intravenous Continuous Davonna Belling, MD 25 mL/hr at 01/07/16 1929 8 mg/hr at 01/07/16 1929  . sodium chloride flush (NS) 0.9 % injection 3 mL  3  mL Intravenous Q12H Toy Baker, MD   3 mL at 01/08/16 0045  . thiamine (VITAMIN B-1) tablet 100 mg  100 mg Oral Daily Toy Baker, MD       Or  . thiamine (B-1) injection 100 mg  100 mg Intravenous Daily Toy Baker, MD        Allergies as of 01/07/2016  . (No Known Allergies)     Review of Systems:    Constitutional: No weight loss, fever, chills, weakness or fatigue HEENT: Eyes: No change in vision               Ears, Nose, Throat:  No change in hearing Skin: No rash Cardiovascular: No chest pain Respiratory: No SOB Gastrointestinal: See HPI and otherwise negative Neurological: Positive for dizziness No headache, dizziness or syncope Musculoskeletal: No new muscle or joint pain Hematologic: No bruising Psychiatric: No history of depression or anxiety   Physical Exam:  Vital signs in last 24 hours: Temp:  [97.9 F (36.6 C)-98.9 F (37.2 C)] 98.3 F (36.8 C) (12/05 0746) Pulse Rate:  [101-114] 102 (12/05 0800) Resp:  [16-27] 17 (12/05 0800) BP: (119-165)/(57-90) 146/57 (12/05 0800) SpO2:  [95 %-99 %] 95 % (12/05 0800) Weight:  [252 lb 3.3 oz (114.4 kg)] 252 lb 3.3 oz (114.4 kg) (12/04 2352) Last BM Date: 01/07/16 General:   Pleasant overweight Caucasian male appears to be in NAD, Well developed, Well nourished, alert and cooperative Head:  Normocephalic and atraumatic. Eyes:   PEERL, EOMI. No icterus. Conjunctiva pink. Ears:  Normal auditory acuity. Neck:  Supple Throat: Oral  cavity and pharynx without inflammation, swelling or lesion.  Lungs: Respirations even and unlabored. Lungs clear to auscultation bilaterally.   No wheezes, crackles, or rhonchi.  Heart: Normal S1, S2. No MRG. Regular rate and rhythm. No peripheral edema, cyanosis or pallor.  Abdomen:  Soft, nondistended,+Mild Epigastric tenderness No rebound or guarding. Normal bowel sounds. No appreciable masses or hepatomegaly. Rectal:  Not performed.  Msk:  Symmetrical without gross  deformities. Peripheral pulses intact.  Extremities:  Without edema, no deformity or joint abnormality.  Neurologic:  Alert and  oriented x4;  grossly normal neurologically. Skin:   Dry and intact without significant lesions or rashes. Psychiatric: Demonstrates good judgement and reason without abnormal affect or behaviors.   LAB RESULTS:  Recent Labs  01/07/16 1748 01/08/16 0138 01/08/16 0712  WBC 10.4 11.4* 8.8  HGB 10.3* 9.5* 8.6*  HCT 31.6* 29.3* 26.0*  PLT 109* 131* 89*   BMET  Recent Labs  01/07/16 1748 01/08/16 0138  NA 136 137  K 5.2* 4.5  CL 100* 102  CO2 24 22  GLUCOSE 381* 407*  BUN 41* 52*  CREATININE 1.02 1.08  CALCIUM 8.7* 8.8*   LFT  Recent Labs  01/08/16 0138  PROT 6.6  ALBUMIN 3.5  AST 26  ALT 16*  ALKPHOS 56  BILITOT 1.5*   PT/INR  Recent Labs  01/07/16 1748 01/08/16 0138  LABPROT 15.5* 15.5*  INR 1.22 1.22    PREVIOUS ENDOSCOPIES:            See HPI   Impression / Plan:   Impression: 1. Coffee-ground emesis: 2 episodes Monday, 01/07/16 2 PM, none since; consider likely PUD related to alcohol and NSAID use versus gastritis versus other 2.  Melena: 2 episodes, none since 1:00 this morning, related to above 3. Acute Blood loss anemia: From 10.3 at time of admission down to 8.6 this morning, related to above 4. H/o Cirrhosis:thought to be etoh related, has been following with Dr. [name] for this, no varices on recent screening in September  Plan: 1. Recommend proceeding with EGD this morning. Discussed risks, benefits, limitations and alternatives and the patient agrees to proceed. 2. NPO until after time of procedure 3. Continue supportive measures including BID PPI 4. Continue to monitor hgb q6h with transfusion as needed <7 5. Please await further recs from Dr. Fuller Plan after time of procedure  Thank you for your kind consultation, we will continue to follow.  Lavone [facility]ian [facility]  01/08/2016, 8:46 AM Pager #:  [phone]     Attending physician's note   I have taken a history, examined the patient and reviewed the chart. I agree with the Advanced Practitioner's note, impression and recommendations. UGI bleed and cirrhosis. R/O ulcer, gastritis, esophagitits. Varices and portal gastropathy less likely as recent EGD did not show these findings. Trend Hb. IV PPI for now. EGD today.    Lucio Edward, MD [facility] [phone] Mon-Fri 8a-5p [phone] after 5p, weekends, holidays

## 2016-01-08 NOTE — Care Management Note (Signed)
Case Management Note  Patient Details  Name: Patrick Kidd MRN: [id number] Date of Birth: [birth year]  Subjective/Objective:      Gi blled with tachycardiac and daily etoh use              Action/Plan: from home Date:  January 08, 2016 Chart reviewed for concurrent status and case management needs. Will continue to follow patient progress. Discharge Planning: following for needs Expected discharge date: [id number] Rhonda Davis, BSN, [place], [place]   Expected Discharge Date:   (unknown)               Expected Discharge Plan:  Home/Self Care  In-House Referral:  Clinical Social Work  Discharge planning Services     Post Acute Care Choice:    Choice offered to:     DME Arranged:    DME Agency:     HH Arranged:    [place] Agency:     Status of Service:  In process, will continue to follow  If discussed at Long Length of Stay Meetings, dates discussed:    Additional Comments:  Leeroy Cha, RN 01/08/2016, 2:44 PM

## 2016-01-08 NOTE — Op Note (Signed)
[facility] Patient Name: Patrick Kidd Procedure Date: 01/08/2016 MRN: [id number] Attending MD: [name] , MD Date of Birth: Jul 14, 1945 CSN: [id number] Age: 70 Admit Type: Inpatient Procedure:                Upper GI endoscopy Indications:              Hematemesis, Melena Providers:                Pricilla Riffle. Fuller Plan, MD, Elmer Ramp. Tilden Dome, RN, Despina Pole Tech, Technician, Adair Laundry, CRNA Referring MD:             [facility] Medicines:                Monitored Anesthesia Care Complications:            No immediate complications. Estimated Blood Loss:     Estimated blood loss was minimal. Procedure:                Pre-Anesthesia Assessment:                           - Prior to the procedure, a History and Physical                            was performed, and patient medications and                            allergies were reviewed. The patient's tolerance of                            previous anesthesia was also reviewed. The risks                            and benefits of the procedure and the sedation                            options and risks were discussed with the patient.                            All questions were answered, and informed consent                            was obtained. Prior Anticoagulants: The patient has                            taken no previous anticoagulant or antiplatelet                            agents. ASA Grade Assessment: III - A patient with                            severe systemic disease. After reviewing the risks  and benefits, the patient was deemed in                            satisfactory condition to undergo the procedure.                           After obtaining informed consent, the endoscope was                            passed under direct vision. Throughout the                            procedure, the patient's blood pressure, pulse,  and                            oxygen saturations were monitored continuously. The                            [id number] scope was introduced through the                            mouth, and advanced to the second part of duodenum.                            The upper GI endoscopy was accomplished without                            difficulty. The patient tolerated the procedure                            well. Scope In: Scope Out: Findings:      The examined esophagus was normal.      Mild portal hypertensive gastropathy was found in the gastric fundus and       in the gastric body. Biopsies were taken with a cold forceps for       histology.      The exam of the stomach was otherwise normal.      One non-bleeding cratered duodenal ulcer with a visible vessel was found       in the duodenal bulb. The lesion was 8 mm in largest dimension. Area was       successfully injected with 3 mL of a 1:10,000 solution of epinephrine       for hemostasis. Coagulation for hemostasis using bipolar probe was       successful.      The second portion of the duodenum was normal. Impression:               - Normal esophagus.                           - Portal hypertensive gastropathy. Biopsied.                           - One non-bleeding duodenal ulcer with a visible  vessel. Injected. Treated with bipolar cautery.                           - Normal second portion of the duodenum. Moderate Sedation:      N/A- Per Anesthesia Care Recommendation:           - Return patient to hospital ward for ongoing care.                           - Clear liquid diet.                           - Continue present medications.                           - No aspirin, ibuprofen, naproxen, or other                            non-steroidal anti-inflammatory drugs.                           - Await pathology results.                           - Protonix (pantoprazole): initiate therapy  with 80                            mg IV bolus, then 8 mg/hr IV by continuous infusion                            for 3 days.                           - PPI long term                           - Outpatient GI follow up with Dr. [name] Procedure Code(s):        --- Professional ---                           [phone], 59, Esophagogastroduodenoscopy, flexible,                            transoral; with control of bleeding, any method                           43239, Esophagogastroduodenoscopy, flexible,                            transoral; with biopsy, single or multiple Diagnosis Code(s):        --- Professional ---                           K76.6, Portal hypertension                           K31.89, Other diseases of stomach and duodenum  K26.4, Chronic or unspecified duodenal ulcer with                            hemorrhage                           K92.0, Hematemesis                           K92.1, Melena (includes Hematochezia) CPT copyright 2016 American [employer] Association. All rights reserved. The codes documented in this report are preliminary and upon coder review may  be revised to meet current compliance requirements. [name], MD 01/08/2016 10:01:47 AM This report has been signed electronically. Number of Addenda: 0

## 2016-01-08 NOTE — Transfer of Care (Signed)
Immediate Anesthesia Transfer of Care Note  Patient: Patrick Kidd  Procedure(s) Performed: Procedure(s): ESOPHAGOGASTRODUODENOSCOPY (EGD) WITH PROPOFOL (N/A)  Patient Location: ENDO  Anesthesia Type:MAC  Level of Consciousness:  sedated, patient cooperative and responds to stimulation  Airway & Oxygen Therapy:Patient Spontanous Breathing and Patient connected to face mask oxgen  Post-op Assessment:  Report given to ENDO RN and Post -op Vital signs reviewed and stable  Post vital signs:  Reviewed and stable  Last Vitals:  Vitals:   01/08/16 0800 01/08/16 0908  BP: (!) 146/57 (!) 148/62  Pulse: (!) 102 (!) 102  Resp: 17 20  Temp:  37 C    Complications: No apparent anesthesia complications

## 2016-01-08 NOTE — Anesthesia Postprocedure Evaluation (Signed)
Anesthesia Post Note  Patient: Patrick Kidd  Procedure(s) Performed: Procedure(s) (LRB): ESOPHAGOGASTRODUODENOSCOPY (EGD) WITH PROPOFOL (N/A)  Patient location during evaluation: Endoscopy Anesthesia Type: MAC Level of consciousness: awake and alert Pain management: pain level controlled Vital Signs Assessment: post-procedure vital signs reviewed and stable Respiratory status: spontaneous breathing, nonlabored ventilation, respiratory function stable and patient connected to nasal cannula oxygen Cardiovascular status: stable and blood pressure returned to baseline Anesthetic complications: no    Last Vitals:  Vitals:   01/08/16 1100 01/08/16 1200  BP: (!) 156/60 (!) 151/61  Pulse: 98 97  Resp: 18 (!) 23  Temp:  37.1 C    Last Pain:  Vitals:   01/08/16 1012  TempSrc: Oral  PainSc:                  [name]

## 2016-01-08 NOTE — H&P (View-Only) (Signed)
Consultation  Referring Provider:  Dr. Grandville Silos    Primary Care Physician:  Mauricio Po, [place] Primary Gastroenterologist:    Dr. [name]     Reason for Consultation: Melena, Coffee-ground emesis, acute blood loss anemia         HPI:   Patrick Kidd is a 70 y.o. Caucasian male with a past medical history of alcoholic cirrhosis, diabetes, GERD, peripheral neuropathy and sleep apnea, who presented to the ED last night, 01/07/16 with a complaint of coffee-ground emesis, abdominal pain and melena.   Patient follows in our clinic with Dr. [name] and was last seen on 01/04/2016. At that time he was there for follow-up of his alcoholic cirrhosis of the liver without ascites and excess gas. He was told to remain completely abstinent from alcohol. He was told to continue simethicone and a probiotic was suggested.   Today, the patient tells me that he has been following with Dr. [name] for about 6 months now for excess gas and during this time was found to have cirrhosis on an MRI. On Sunday afternoon, 01/06/16 the patient had a bowel movement and behind this bowel movement was a lot of bright red blood, this was not in the stool. He describes that he didn't think much of this and on Monday around 2 PM was sitting in his chair and went to stand up and felt very "woozy". He sat right back down again and then felt the need to vomit. He threw up  outside his door twice. He tells me this was more of a "spit up". He does tell me this looked like "coffee grounds". He did not think much of this either as all he had to drink that day was coffee. About 30 minutes later the patient became very dizzy and when he started to walk was "listing". He called our office and was advised to proceed to the ER. He did have 1 melenic stool at home. He arrived at the ER around 4 PM and waited for "8.5 hrs". Since he has been in the hospital he has "gagged once", and had a "purple/brown" bowel movement around 1:00 this  morning. Associated symptoms include an epigastric and lower abdominal cramping pain, though the patient tells me this has not changed over the past 6 months.   Patient does use Prevacid 30 mg and/or Prilosec 40 mg when necessary, he has been doing this for some time, usually at least once a day. He tells me he was directed to do this. He also uses Pepcid at night occasionally. He does use at least of 2 Aleve per week for foot pain. He also continues to drink alcohol, though this is decreased from previously. He tells me he drinks 2 glasses of vodka per day and sometimes a glass of wine at night.   Patient denies fever, chills, syncope, shortness of breath, heartburn, reflux or dysphagia.  Past GI procedures: EGD-10/18/15-Dr. [name]: Benign-appearing esophageal stenosis which was dilated, gastritis and otherwise normal exam; pathology revealed mild chronic inactive gastritis ;recommended recall 3 years Colonoscopy-10/18/15-Dr. [name]: One 10 mm polyp in the ascending colon, three 1-2 mm polyps in the descending colon, one 7 mm polyp in the descending colon and otherwise normal exam; pathology revealed tubular adenoma recommended recall was 3 years  Past Medical History:  Diagnosis Date  . Acrophobia   . Alcoholic cirrhosis ([place]) 0000000  . Arthritis    foot by big toe  . Cataract    removed both eyes  .  Chronic cough    PMH of  . Diabetes mellitus without complication ([place])   . Diverticulosis 07-03-2010   Colonoscopy.   Marland Kitchen GERD (gastroesophageal reflux disease)   . Gout   . Granuloma annulare   . Hx of adenomatous colonic polyps 07-03-2010   Colonoscopy. F/U due 2017  . Hydrocele   . Nonspecific elevation of levels of transaminase or lactic acid dehydrogenase (LDH)   . Peripheral neuropathy ([place])   . Plantar fasciitis    PMH of  . Sleep apnea    no cpap    Past Surgical History:  Procedure Laterality Date  . CATARACT EXTRACTION, BILATERAL  12/2011    Dr Gershon Crane  . COLONOSCOPY      . COLONOSCOPY W/ POLYPECTOMY  07/03/2010   2 adenomas, diverticulosis on right. Dr [name]  . FLEXIBLE SIGMOIDOSCOPY  2000  . POLYPECTOMY    . SIGMOIDOSCOPY    . WISDOM TOOTH EXTRACTION      Family History  Problem Relation Age of Onset  . Lung cancer Mother     smoker  . Leukemia Father     Acute myelocytic  . Diabetes Neg Hx   . Stroke Neg Hx   . Heart disease Neg Hx   . Colon cancer Neg Hx   . Colon polyps Neg Hx   . Esophageal cancer Neg Hx   . Rectal cancer Neg Hx   . Stomach cancer Neg Hx     Social History  Substance Use Topics  . Smoking status: Former Smoker    Packs/day: 2.00    Years: 6.00    Types: Cigarettes    Quit date: 02/04/1971  . Smokeless tobacco: Never Used     Comment: smoked age [age], up to 2 ppd  . Alcohol use 12.6 oz/week    21 Shots of liquor per week     Comment: 2-3 a day    Prior to Admission medications   Medication Sig Start Date End Date Taking? Authorizing Provider  famotidine (PEPCID) 20 MG tablet Take 20 mg by mouth at bedtime as needed for heartburn or indigestion.   Yes Historical Provider, MD  furosemide (LASIX) 40 MG tablet Take 1 tablet (40 mg total) by mouth as needed. 02/21/15  Yes [name], FNP  gabapentin (NEURONTIN) 300 MG capsule Take one capsule four times a day as needed.    Yes Historical Provider, MD  metFORMIN (GLUCOPHAGE-XR) 500 MG 24 hr tablet Take 1 tablet (500 mg total) by mouth daily with breakfast. 08/22/15  Yes [name], FNP  naproxen sodium (ANAPROX) 220 MG tablet Take 440 mg by mouth 2 (two) times daily with a meal.   Yes Historical Provider, MD  omeprazole (PRILOSEC) 20 MG capsule Take 20 mg by mouth daily as needed.   Yes Historical Provider, MD  Probiotic Product (ALIGN) 4 MG CAPS Take 4 mg by mouth daily.   Yes Historical Provider, MD  sildenafil (REVATIO) 20 MG tablet 2-5 qd prn Patient taking differently: Take 20-100 mg by mouth daily as needed (ED).  11/15/14  Yes Hendricks Limes, MD   simethicone (GAS-X) 80 MG chewable tablet Chew 1 tablet (80 mg total) by mouth every 6 (six) hours as needed for flatulence. 03/04/14  Yes Tatyana Kirichenko, PA-C    Current Facility-Administered Medications  Medication Dose Route Frequency Provider Last Rate Last Dose  . 0.9 %  sodium chloride infusion   Intravenous Continuous Toy Baker, MD 100 mL/hr at 01/08/16 0045    .  acetaminophen (TYLENOL) tablet 650 mg  650 mg Oral Q6H PRN Toy Baker, MD       Or  . acetaminophen (TYLENOL) suppository 650 mg  650 mg Rectal Q6H PRN Toy Baker, MD      . folic acid (FOLVITE) tablet 1 mg  1 mg Oral Daily Toy Baker, MD      . gabapentin (NEURONTIN) capsule 300 mg  300 mg Oral QHS Toy Baker, MD   300 mg at 01/08/16 0052  . HYDROcodone-acetaminophen (NORCO/VICODIN) 5-325 MG per tablet 1-2 tablet  1-2 tablet Oral Q4H PRN Toy Baker, MD      . insulin aspart (novoLOG) injection 0-9 Units  0-9 Units Subcutaneous Q4H Toy Baker, MD   7 Units at 01/08/16 0750  . LORazepam (ATIVAN) injection 0-4 mg  0-4 mg Intravenous Q6H Toy Baker, MD       Followed by  . [START ON 01/10/2016] LORazepam (ATIVAN) injection 0-4 mg  0-4 mg Intravenous Q12H Anastassia Doutova, MD      . LORazepam (ATIVAN) tablet 1 mg  1 mg Oral Q6H PRN Toy Baker, MD       Or  . LORazepam (ATIVAN) injection 1 mg  1 mg Intravenous Q6H PRN Toy Baker, MD      . multivitamin with minerals tablet 1 tablet  1 tablet Oral Daily Toy Baker, MD      . ondansetron (ZOFRAN) tablet 4 mg  4 mg Oral Q6H PRN Toy Baker, MD       Or  . ondansetron (ZOFRAN) injection 4 mg  4 mg Intravenous Q6H PRN Toy Baker, MD      . pantoprazole (PROTONIX) 80 mg in sodium chloride 0.9 % 250 mL (0.32 mg/mL) infusion  8 mg/hr Intravenous Continuous Davonna Belling, MD 25 mL/hr at 01/07/16 1929 8 mg/hr at 01/07/16 1929  . sodium chloride flush (NS) 0.9 % injection 3 mL  3  mL Intravenous Q12H Toy Baker, MD   3 mL at 01/08/16 0045  . thiamine (VITAMIN B-1) tablet 100 mg  100 mg Oral Daily Toy Baker, MD       Or  . thiamine (B-1) injection 100 mg  100 mg Intravenous Daily Toy Baker, MD        Allergies as of 01/07/2016  . (No Known Allergies)     Review of Systems:    Constitutional: No weight loss, fever, chills, weakness or fatigue HEENT: Eyes: No change in vision               Ears, Nose, Throat:  No change in hearing Skin: No rash Cardiovascular: No chest pain Respiratory: No SOB Gastrointestinal: See HPI and otherwise negative Neurological: Positive for dizziness No headache, dizziness or syncope Musculoskeletal: No new muscle or joint pain Hematologic: No bruising Psychiatric: No history of depression or anxiety   Physical Exam:  Vital signs in last 24 hours: Temp:  [97.9 F (36.6 C)-98.9 F (37.2 C)] 98.3 F (36.8 C) (12/05 0746) Pulse Rate:  [101-114] 102 (12/05 0800) Resp:  [16-27] 17 (12/05 0800) BP: (119-165)/(57-90) 146/57 (12/05 0800) SpO2:  [95 %-99 %] 95 % (12/05 0800) Weight:  [252 lb 3.3 oz (114.4 kg)] 252 lb 3.3 oz (114.4 kg) (12/04 2352) Last BM Date: 01/07/16 General:   Pleasant overweight Caucasian male appears to be in NAD, Well developed, Well nourished, alert and cooperative Head:  Normocephalic and atraumatic. Eyes:   PEERL, EOMI. No icterus. Conjunctiva pink. Ears:  Normal auditory acuity. Neck:  Supple Throat: Oral  cavity and pharynx without inflammation, swelling or lesion.  Lungs: Respirations even and unlabored. Lungs clear to auscultation bilaterally.   No wheezes, crackles, or rhonchi.  Heart: Normal S1, S2. No MRG. Regular rate and rhythm. No peripheral edema, cyanosis or pallor.  Abdomen:  Soft, nondistended,+Mild Epigastric tenderness No rebound or guarding. Normal bowel sounds. No appreciable masses or hepatomegaly. Rectal:  Not performed.  Msk:  Symmetrical without gross  deformities. Peripheral pulses intact.  Extremities:  Without edema, no deformity or joint abnormality.  Neurologic:  Alert and  oriented x4;  grossly normal neurologically. Skin:   Dry and intact without significant lesions or rashes. Psychiatric: Demonstrates good judgement and reason without abnormal affect or behaviors.   LAB RESULTS:  Recent Labs  01/07/16 1748 01/08/16 0138 01/08/16 0712  WBC 10.4 11.4* 8.8  HGB 10.3* 9.5* 8.6*  HCT 31.6* 29.3* 26.0*  PLT 109* 131* 89*   BMET  Recent Labs  01/07/16 1748 01/08/16 0138  NA 136 137  K 5.2* 4.5  CL 100* 102  CO2 24 22  GLUCOSE 381* 407*  BUN 41* 52*  CREATININE 1.02 1.08  CALCIUM 8.7* 8.8*   LFT  Recent Labs  01/08/16 0138  PROT 6.6  ALBUMIN 3.5  AST 26  ALT 16*  ALKPHOS 56  BILITOT 1.5*   PT/INR  Recent Labs  01/07/16 1748 01/08/16 0138  LABPROT 15.5* 15.5*  INR 1.22 1.22    PREVIOUS ENDOSCOPIES:            See HPI   Impression / Plan:   Impression: 1. Coffee-ground emesis: 2 episodes Monday, 01/07/16 2 PM, none since; consider likely PUD related to alcohol and NSAID use versus gastritis versus other 2.  Melena: 2 episodes, none since 1:00 this morning, related to above 3. Acute Blood loss anemia: From 10.3 at time of admission down to 8.6 this morning, related to above 4. H/o Cirrhosis:thought to be etoh related, has been following with Dr. [name] for this, no varices on recent screening in September  Plan: 1. Recommend proceeding with EGD this morning. Discussed risks, benefits, limitations and alternatives and the patient agrees to proceed. 2. NPO until after time of procedure 3. Continue supportive measures including BID PPI 4. Continue to monitor hgb q6h with transfusion as needed <7 5. Please await further recs from Dr. Fuller Plan after time of procedure  Thank you for your kind consultation, we will continue to follow.  Lavone Nian [facility]  01/08/2016, 8:46 AM Pager #:  [phone]     Attending physician's note   I have taken a history, examined the patient and reviewed the chart. I agree with the Advanced Practitioner's note, impression and recommendations. UGI bleed and cirrhosis. R/O ulcer, gastritis, esophagitits. Varices and portal gastropathy less likely as recent EGD did not show these findings. Trend Hb. IV PPI for now. EGD today.    Lucio Edward, MD [facility] [phone] Mon-Fri 8a-5p [phone] after 5p, weekends, holidays

## 2016-01-08 NOTE — Progress Notes (Signed)
PROGRESS NOTE    Patrick Kidd  [id number] DOB: [birth year] DOA: 01/07/2016 PCP: Mauricio Po, FNP    Brief Narrative:  70 year old gentleman history of alcoholic cirrhosis, diabetes, hypertension who presented with a upper GI bleed.   Assessment & Plan:   Principal Problem:   GI bleed Active Problems:   Duodenal ulcer with hemorrhage   Thrombocytopenia (HCC)   Type 2 diabetes mellitus (HCC)   Alcoholic cirrhosis (HCC)   GI bleeding  #1 upper GI bleed secondary to duodenal ulcer Likely secondary to nonbleeding duodenal ulcer with visible vessel noted on upper endoscopy. Status post injection and bipolar cautery. Patient with no further bleeding. Patient currently on a Protonix drip which was subsequently be transitioned to IV PPI and then oral PPI. Patient placed on clear liquids and tolerating. Pathology pending. Continue clear liquids.  #2 type 2 diabetes mellitus Hemoglobin A1c was 6.7 on 08/16/2015. Repeat hemoglobin A1c. CBGs have ranged from 236-304. Place on Lantus 10 units daily. Sliding scale insulin.  #3 Alcoholic cirrhosis Patient with chronically elevated LFTs. Alcohol cessation. Pain management. Supportive care.  #4 thrombocytopenia Likely secondary to call abuse.  #5 alcohol abuse Ativan CIWA protocol.   DVT prophylaxis: SCDs Code Status: Full Family Communication: Updated patient. No family at bedside. Disposition Plan: Home when hemoglobin is stable with no further bleeding and tolerating oral intake.   Consultants:   Gastroenterology: Dr. Fuller Plan 01/08/2016  Procedures:   Upper endoscopy: Dr. Fuller Plan 01/08/2016  Chest x-ray 01/08/2016  Antimicrobials:   None   Subjective: Patient denies any further bleeding. No nausea. No vomiting. No abdominal pain.  Objective: Vitals:   01/08/16 1030 01/08/16 1100 01/08/16 1200 01/08/16 1300  BP: (!) 139/56 (!) 156/60 (!) 151/61 (!) 126/41  Pulse: (!) 104 98 97 89  Resp: (!) 22 18 (!) 23 18    Temp:   98.7 F (37.1 C)   TempSrc:      SpO2: 95% 95% 98% 96%  Weight:      Height:        Intake/Output Summary (Last 24 hours) at 01/08/16 1344 Last data filed at 01/08/16 1200  Gross per 24 hour  Intake          1937.92 ml  Output                0 ml  Net          1937.92 ml   Filed Weights   01/07/16 2352 01/08/16 0908  Weight: 114.4 kg (252 lb 3.3 oz) 105.2 kg (232 lb)    Examination:  General exam: Appears calm and comfortable. Respiratory system: Clear to auscultation. Respiratory effort normal. Cardiovascular system: S1 & S2 heard, RRR. No JVD, murmurs, rubs, gallops or clicks. No pedal edema. Gastrointestinal system: Abdomen is obese, nondistended, soft and nontender. No organomegaly or masses felt. Normal bowel sounds heard. Central nervous system: Alert and oriented. No focal neurological deficits. Extremities: Symmetric 5 x 5 power. Skin: No rashes, lesions or ulcers Psychiatry: Judgement and insight appear normal. Mood & affect appropriate.     Data Reviewed: I have personally reviewed following labs and imaging studies  CBC:  Recent Labs Lab 01/07/16 1748 01/08/16 0138 01/08/16 0712 01/08/16 1233  WBC 10.4 11.4* 8.8 5.9  HGB 10.3* 9.5* 8.6* 8.3*  HCT 31.6* 29.3* 26.0* 24.9*  MCV 86.6 86.2 85.2 84.7  PLT 109* 131* 89* 91*   Basic Metabolic Panel:  Recent Labs Lab 01/07/16 1748 01/08/16 0138  NA 136 137  K 5.2* 4.5  CL 100* 102  CO2 24 22  GLUCOSE 381* 407*  BUN 41* 52*  CREATININE 1.02 1.08  CALCIUM 8.7* 8.8*  MG  --  1.7  PHOS  --  2.6   GFR: Estimated Creatinine Clearance: 81.1 mL/min (by C-G formula based on SCr of 1.08 mg/dL). Liver Function Tests:  Recent Labs Lab 01/02/16 1405 01/07/16 1748 01/08/16 0138  AST 19 24 26   ALT 11 17 16*  ALKPHOS 78 55 56  BILITOT 1.1 1.5* 1.5*  PROT 7.3 6.4* 6.6  ALBUMIN 3.9 3.6 3.5   No results for input(s): LIPASE, AMYLASE in the last 168 hours. No results for input(s): AMMONIA in  the last 168 hours. Coagulation Profile:  Recent Labs Lab 01/07/16 1748 01/08/16 0138  INR 1.22 1.22   Cardiac Enzymes: No results for input(s): CKTOTAL, CKMB, CKMBINDEX, TROPONINI in the last 168 hours. BNP (last 3 results) No results for input(s): PROBNP in the last 8760 hours. HbA1C: No results for input(s): HGBA1C in the last 72 hours. CBG:  Recent Labs Lab 01/08/16 0051 01/08/16 0356 01/08/16 0729 01/08/16 0925 01/08/16 1142  GLUCAP 413* 374* 312* 304* 236*   Lipid Profile: No results for input(s): CHOL, HDL, LDLCALC, TRIG, CHOLHDL, LDLDIRECT in the last 72 hours. Thyroid Function Tests:  Recent Labs  01/08/16 0138  TSH 1.525   Anemia Panel: No results for input(s): VITAMINB12, FOLATE, FERRITIN, TIBC, IRON, RETICCTPCT in the last 72 hours. Sepsis Labs: No results for input(s): PROCALCITON, LATICACIDVEN in the last 168 hours.  Recent Results (from the past 240 hour(s))  MRSA PCR Screening     Status: None   Collection Time: 01/07/16 11:47 PM  Result Value Ref Range Status   MRSA by PCR NEGATIVE NEGATIVE Final    Comment:        The GeneXpert MRSA Assay (FDA approved for NASAL specimens only), is one component of a comprehensive MRSA colonization surveillance program. It is not intended to diagnose MRSA infection nor to guide or monitor treatment for MRSA infections.          Radiology Studies: No results found.      Scheduled Meds: . folic acid  1 mg Oral Daily  . gabapentin  300 mg Oral QHS  . insulin aspart  0-9 Units Subcutaneous Q4H  . LORazepam  0-4 mg Intravenous Q6H   Followed by  . [START ON 01/10/2016] LORazepam  0-4 mg Intravenous Q12H  . multivitamin with minerals  1 tablet Oral Daily  . sodium chloride flush  3 mL Intravenous Q12H  . thiamine  100 mg Oral Daily   Or  . thiamine  100 mg Intravenous Daily   Continuous Infusions: . pantoprozole (PROTONIX) infusion 8 mg/hr (01/07/16 1929)     LOS: 1 day    Time spent:  19 mins    Valdemar Mcclenahan, MD [facility] Pager [phone]  If 7PM-7AM, please contact night-coverage [email or web address] Password [email or web address] 01/08/2016, 1:44 PM

## 2016-01-08 NOTE — Progress Notes (Signed)
Inpatient Diabetes Program Recommendations  AACE/ADA: New Consensus Statement on Inpatient Glycemic Control (2015)  Target Ranges:  Prepandial:   less than 140 mg/dL      Peak postprandial:   less than 180 mg/dL (1-2 hours)      Critically ill patients:  140 - 180 mg/dL   Results for Patrick Kidd, Patrick Kidd (MRN [id number]) as of 01/08/2016 12:43  Ref. Range 01/08/2016 00:51 01/08/2016 03:56 01/08/2016 07:29 01/08/2016 09:25 01/08/2016 11:42  Glucose-Capillary Latest Ref Range: 65 - 99 mg/dL 413 (H) 374 (H) 312 (H) 304 (H) 236 (H)     Admit GIB.   History: DM2, Cirrhosis, ETOH  Home DM Meds: Metformin 500 mg daily  Current: Novolog Sensitive Correction Scale/ SSI (0-9 units) Q4 hours       -Note Current A1c level pending.  Question accuracy of result given pt's Hemoglobin level was low on admission.  -Note Novolog Sensitive Correction Scale/ SSI (0-9 units) started this AM.      MD- Please consider increasing Novolog SSI to Moderate scale (0-15 units) Q4 hours      --Will follow patient during hospitalization--  Wyn Quaker RN, MSN, CDE Diabetes Coordinator Inpatient Glycemic Control Team Team Pager: [phone] (8a-5p)

## 2016-01-08 NOTE — Anesthesia Preprocedure Evaluation (Addendum)
Anesthesia Evaluation    Airway Mallampati: II  TM Distance: >3 FB Neck ROM: Full    Dental no notable dental hx.    Pulmonary sleep apnea , former smoker,    Pulmonary exam normal breath sounds clear to auscultation       Cardiovascular Normal cardiovascular exam Rhythm:Regular Rate:Normal     Neuro/Psych    GI/Hepatic GERD  ,(+) Cirrhosis     substance abuse  alcohol use, GI bleed   Endo/Other  diabetes  Renal/GU      Musculoskeletal   Abdominal   Peds  Hematology  (+) anemia , thrombocytopenia   Anesthesia Other Findings   Reproductive/Obstetrics                            Anesthesia Physical Anesthesia Plan  ASA: III  Anesthesia Plan: MAC   Post-op Pain Management:    Induction:   Airway Management Planned: Simple Face Mask and Nasal Cannula  Additional Equipment:   Intra-op Plan:   Post-operative Plan: Extubation in OR  Informed Consent: I have reviewed the patients History and Physical, chart, labs and discussed the procedure including the risks, benefits and alternatives for the proposed anesthesia with the patient or authorized representative who has indicated his/her understanding and acceptance.   Dental advisory given  Plan Discussed with: CRNA  Anesthesia Plan Comments:         Anesthesia Quick Evaluation

## 2016-01-09 ENCOUNTER — Encounter (HOSPITAL_COMMUNITY): Payer: Self-pay | Admitting: Gastroenterology

## 2016-01-09 DIAGNOSIS — R142 Eructation: Secondary | ICD-10-CM

## 2016-01-09 DIAGNOSIS — R141 Gas pain: Secondary | ICD-10-CM

## 2016-01-09 DIAGNOSIS — E1142 Type 2 diabetes mellitus with diabetic polyneuropathy: Secondary | ICD-10-CM

## 2016-01-09 DIAGNOSIS — K922 Gastrointestinal hemorrhage, unspecified: Secondary | ICD-10-CM

## 2016-01-09 DIAGNOSIS — D696 Thrombocytopenia, unspecified: Secondary | ICD-10-CM

## 2016-01-09 DIAGNOSIS — K219 Gastro-esophageal reflux disease without esophagitis: Secondary | ICD-10-CM

## 2016-01-09 DIAGNOSIS — R143 Flatulence: Secondary | ICD-10-CM

## 2016-01-09 LAB — BASIC METABOLIC PANEL
Anion gap: 6 (ref 5–15)
BUN: 32 mg/dL — ABNORMAL HIGH (ref 6–20)
CALCIUM: 8.1 mg/dL — AB (ref 8.9–10.3)
CO2: 27 mmol/L (ref 22–32)
CREATININE: 0.98 mg/dL (ref 0.61–1.24)
Chloride: 108 mmol/L (ref 101–111)
GFR calc Af Amer: >60 mL/min (ref >60)
GFR calc non Af Amer: >60 mL/min (ref >60)
GLUCOSE: 142 mg/dL — AB (ref 65–99)
Potassium: 3.8 mmol/L (ref 3.5–5.1)
Sodium: 141 mmol/L (ref 135–145)

## 2016-01-09 LAB — GLUCOSE, CAPILLARY
GLUCOSE-CAPILLARY: 251 mg/dL — AB (ref 65–99)
GLUCOSE-CAPILLARY: 285 mg/dL — AB (ref 65–99)
Glucose-Capillary: 142 mg/dL — ABNORMAL HIGH (ref 65–99)
Glucose-Capillary: 144 mg/dL — ABNORMAL HIGH (ref 65–99)
Glucose-Capillary: 195 mg/dL — ABNORMAL HIGH (ref 65–99)
Glucose-Capillary: 247 mg/dL — ABNORMAL HIGH (ref 65–99)

## 2016-01-09 LAB — CBC
HEMATOCRIT: 23.1 % — AB (ref 39.0–52.0)
Hemoglobin: 7.5 g/dL — ABNORMAL LOW (ref 13.0–17.0)
MCH: 28.1 pg (ref 26.0–34.0)
MCHC: 32.5 g/dL (ref 30.0–36.0)
MCV: 86.5 fL (ref 78.0–100.0)
Platelets: 78 10*3/uL — ABNORMAL LOW (ref 150–400)
RBC: 2.67 MIL/uL — ABNORMAL LOW (ref 4.22–5.81)
RDW: 18.6 % — AB (ref 11.5–15.5)
WBC: 5.4 10*3/uL (ref 4.0–10.5)

## 2016-01-09 LAB — HEMOGLOBIN A1C
HEMOGLOBIN A1C: 8.4 % — AB (ref 4.8–5.6)
Mean Plasma Glucose: 194 mg/dL

## 2016-01-09 LAB — TROPONIN I

## 2016-01-09 MED ORDER — ALUM & MAG HYDROXIDE-SIMETH 200-200-20 MG/5ML PO SUSP
15.0000 mL | Freq: Four times a day (QID) | ORAL | Status: DC | PRN
  Administered 2016-01-09: 15 mL via ORAL
  Filled 2016-01-09: qty 30

## 2016-01-09 MED ORDER — GABAPENTIN 300 MG PO CAPS
300.0000 mg | ORAL_CAPSULE | Freq: Four times a day (QID) | ORAL | Status: DC | PRN
  Administered 2016-01-09 – 2016-01-10 (×2): 300 mg via ORAL
  Filled 2016-01-09 (×2): qty 1

## 2016-01-09 MED ORDER — INFLUENZA VAC SPLIT QUAD 0.5 ML IM SUSY
0.5000 mL | PREFILLED_SYRINGE | INTRAMUSCULAR | Status: AC
  Administered 2016-01-10: 0.5 mL via INTRAMUSCULAR
  Filled 2016-01-09: qty 0.5

## 2016-01-09 NOTE — Progress Notes (Signed)
Progress Note   Subjective  Chief Complaint: Melena, Coffee-ground emesis, Acute Blood loss anemia  This morning the patient is found comfortably laying on his side in the bed, his wife is sitting by him. He explains that he has had no further episodes of nausea or vomiting, he does continue with a small amount of epigastric discomfort. They do ask some questions regarding findings from EGD yesterday and long-term outlook   Objective   Vital signs in last 24 hours: Temp:  [97.2 F (36.2 C)-98.7 F (37.1 C)] 97.9 F (36.6 C) (12/06 0800) Pulse Rate:  [77-104] 84 (12/06 0713) Resp:  [13-24] 21 (12/06 0713) BP: (115-156)/(41-61) 130/49 (12/06 0713) SpO2:  [92 %-100 %] 94 % (12/06 0713) Weight:  [254 lb 10.1 oz (115.5 kg)] 254 lb 10.1 oz (115.5 kg) (12/06 0356) Last BM Date: 01/07/16 General:  Caucasian male in NAD Heart:  Regular rate and rhythm; no murmurs Lungs: Respirations even and unlabored, lungs CTA bilaterally Abdomen:  Soft,+ Mild Epigastric ttp and nondistended. Normal bowel sounds. Extremities:  Without edema. Neurologic:  Alert and oriented,  grossly normal neurologically. Psych:  Cooperative. Normal mood and affect.  Intake/Output from previous day: 12/05 0701 - 12/06 0700 In: 2815 [P.O.:1440; I.V.:1375] Out: 451 [Urine:451]  Lab Results:  Recent Labs  01/08/16 1233 01/08/16 1957 01/09/16 0326  WBC 5.9 6.0 5.4  HGB 8.3* 7.8* 7.5*  HCT 24.9* 23.8* 23.1*  PLT 91* 75* 78*   BMET  Recent Labs  01/07/16 1748 01/08/16 0138 01/09/16 0326  NA 136 137 141  K 5.2* 4.5 3.8  CL 100* 102 108  CO2 24 22 27   GLUCOSE 381* 407* 142*  BUN 41* 52* 32*  CREATININE 1.02 1.08 0.98  CALCIUM 8.7* 8.8* 8.1*   LFT  Recent Labs  01/08/16 0138  PROT 6.6  ALBUMIN 3.5  AST 26  ALT 16*  ALKPHOS 56  BILITOT 1.5*   PT/INR  Recent Labs  01/07/16 1748 01/08/16 0138  LABPROT 15.5* 15.5*  INR 1.22 1.22    Studies/Results: Dg Chest 2 View  Result Date:  01/08/2016 CLINICAL DATA:  Leukocytosis EXAM: CHEST  2 VIEW COMPARISON:  03/04/2014 FINDINGS: Cardiac shadow is within normal limits. The lungs are well aerated bilaterally. Minimal platelike atelectasis is noted in the left lung base. No acute bony abnormality is seen. IMPRESSION: Minimal left basilar atelectasis. Electronically Signed   By: Inez Catalina M.D.   On: 01/08/2016 15:34   EGD 01/08/16-Dr. Fuller Plan Findings:      The examined esophagus was normal.      Mild portal hypertensive gastropathy was found in the gastric fundus and       in the gastric body. Biopsies were taken with a cold forceps for       histology.      The exam of the stomach was otherwise normal.      One non-bleeding cratered duodenal ulcer with a visible vessel was found       in the duodenal bulb. The lesion was 8 mm in largest dimension. Area was       successfully injected with 3 mL of a 1:10,000 solution of epinephrine       for hemostasis. Coagulation for hemostasis using bipolar probe was       successful.      The second portion of the duodenum was normal. Impression:       - Normal esophagus.                           -  Portal hypertensive gastropathy. Biopsied.                           - One non-bleeding duodenal ulcer with a visible                            vessel. Injected. Treated with bipolar cautery.                           - Normal second portion of the duodenum. Recommendation: - Return patient to hospital ward for ongoing care.                           - Clear liquid diet.                           - Continue present medications.                           - No aspirin, ibuprofen, naproxen, or other                            non-steroidal anti-inflammatory drugs.                           - Await pathology results.                           - Protonix (pantoprazole): initiate therapy with 80                            mg IV bolus, then 8 mg/hr IV by continuous infusion                             for 3 days.                           - PPI long term                           - Outpatient GI follow up with Dr. [name]   Assessment / Plan:   Assessment: 1. Duodenal Ulcer: with episode of melena, epigastric pain, vomiting-treated yesterday at time of EGD. Hgb now stable. Pt on Protonix drip 2. Melena: no further since time of admission, likely due to above 3. Epigastric pain: dulled, some better overnight  Plan: 1. Continue supportive measures 2. Continue PPI drip at 8 mg per hour for a total of 72 hours 3. Discussed with patient that he should avoid NSAID use in the future 4. Patient will remain on PPI long-term 5. Please await further recommendations from Dr. Fuller Plan   LOS: 2 days   Lavone Nian [facility]  01/09/2016, 10:21 AM  Pager # [phone]     Attending physician's note   I have taken an interval history, reviewed the chart and examined the patient. I agree with the Advanced Practitioner's note, impression and recommendations. No recurrent GI bleeding. Hb now at 7.5. Gastric pathology showed slight chronic inflammation, no H pylori. Continue current mgmt.  Lucio Edward, MD [facility] [phone] Mon-Fri 8a-5p [phone] after 5p, weekends, holidays

## 2016-01-09 NOTE — Progress Notes (Signed)
Upon Anderson Malta, NT entering patient's room, he told her he had chest discomfort on right side that radiated to the left, was continuous, and had lasted for about the last hour. Patient states he feels like it is gas pain. NT informed this RN. MD notified. Orders received. Will continue to monitor.

## 2016-01-09 NOTE — Progress Notes (Signed)
PROGRESS NOTE    Patrick Kidd  [id number] DOB: August 16, 1945 DOA: 01/07/2016 PCP: Mauricio Po, FNP   Brief Narrative:  [name] is a 70 year old obese Caucasian male with a PMH of Alcoholic Cirrhosis, Diabetes Mellitus Type 2, Hypertension and other comorbidities who presented with an acute upper GI Bleed with presenting complaints of Melena and Coffee-ground Emesis. He underwent an EGD and was found to have a Duodenal Ulcer which was non-bleeding but was s/p injection and Cautery.  He is currently on a Protonix gtt and being monitored for EtOH withdrawal.   Assessment & Plan:   Principal Problem:   GI bleed Active Problems:   Thrombocytopenia ([place])   Type 2 diabetes mellitus ([place])   Alcoholic cirrhosis (HCC)   GI bleeding   Duodenal ulcer with hemorrhage   Acute GI bleeding  Acute Blood Loss Anemia from Upper GIB from Duodenal Ulcer s/p EGD with Injection and Cautery -S/p EGD with Injection and Bipolar Cautery; No further bleeding observed -C/w with Protonix gtt at 8 mg/hr for total of 72 hours per GI and transition to po Protonix po BID Long Term -Hb/Hct went from 8.3/24.9 -> 7.8/23.8 -> 7.5/23.1 -Continue Supportive Measures and Avoid NSAIDs and EtOH use -C/w Clear Liquids and Advance as Tolerated per GI -Pathology of EGD Pending -Repeat CBC in AM -Outpatient GI Follow Up with Dr. [name] at D/C  Type 2 Diabetes Mellitus complicated by Diabetic Neuropathy -Hemoglobin A1c was 6.7 on 08/16/2015.  -Repeat hemoglobin A1c was 8.4.  -CBGs have ranged from 142-251. Metformin Held in Hospital -C/w Sensitive SSI q4h -C/w Gabapentin 300 mg po 4 Times Dailyprn  Chest Discomfort/Abdominal Pain with Hx of Flatulence, Eructation and Gas Pain -Added Maalox for Indigestion/Heartburn/Flatulence -STAT EKG showed no evidence of ST Elevation or Depression on My Interpretation -C/w Telemetry and EKG's; Check Troponin I Stat however unlikely to be Cardiac in  Nature  Alcoholic Cirrhosis -EGD showed Portal Hypertensive Gastropathy -Patient with chronically elevated LFTs. -Alcohol cessation Counseling given.  -Pain management and Supportive care. -Repeat CMP in AM  Thrombocytopenia -Patient's Plt Count went from 75 -> 78 -Likely from Alcohol Abuse -Repeat CBC in AM  Alcohol Abuse with Concerns for Withdrawal -IV Zofran for Nausea/Vomiting -C/w Lorazepam IV CIWA protocol. -C/w MVI 1 tab po Daily, Thiamine 100 mg po Daily, and Folic Acid 1 mg po Daily   GERD -C/w with Protonix gtt and transition to po BID after 72 hours  DVT prophylaxis: SCDs Code Status: FULL Family Communication: Discussed with Wife at bedside Disposition Plan: Transfer to General Medical Floor with Telemetry  Consultants:   Gastroenterology Dr. Fuller Plan (01/08/2016)  Procedures: Upper Endoscopy on 01/08/2016  Antimicrobials: None  Subjective: Seen and examined at bedside this AM and was doing well. Stated he had some abdominal discomfort from the EGD. No N/V CP or SOB. Has not seen a reoccurrence of blood in stool and no more coffee ground emesis. No other concerns or complaints   Objective: Vitals:   01/09/16 0500 01/09/16 0551 01/09/16 0600 01/09/16 0713  BP:    (!) 130/49  Pulse: 81 79 79 84  Resp: 20  16 (!) 21  Temp:      TempSrc:      SpO2: 92%  94% 94%  Weight:      Height:        Intake/Output Summary (Last 24 hours) at 01/09/16 0723 Last data filed at 01/09/16 0600  Gross per 24 hour  Intake  2815 ml  Output              451 ml  Net             2364 ml   Filed Weights   01/07/16 2352 01/08/16 0908 01/09/16 0356  Weight: 114.4 kg (252 lb 3.3 oz) 105.2 kg (232 lb) 115.5 kg (254 lb 10.1 oz)   Examination: Physical Exam:  Constitutional: WN/WD, NAD and appears calm and comfortable Eyes: Lids and conjunctivae normal, sclerae slightly icteric  ENMT: External Ears, Nose appear normal. Grossly normal hearing.  Neck: Appears  normal, supple, no cervical masses, normal ROM, no appreciable thyromegaly Respiratory: Clear to auscultation bilaterally, no wheezing, rales, rhonchi or crackles. Normal respiratory effort and patient is not tachypenic. No accessory muscle use.  Cardiovascular: RRR, no murmurs / rubs / gallops. S1 and S2 auscultated. No extremity edema. Abdomen: Soft, non-tender, non-distended. No masses palpated. No appreciable hepatosplenomegaly or ascites. Bowel sounds positive x4.  GU: Deferred. Musculoskeletal: No clubbing / cyanosis of digits/nails. No joint deformity upper and lower extremities. No contractures. Normal strength and muscle tone.  Skin: No rashes, lesions, ulcers on limited skin evaluation. No induration; Warm and dry.  Neurologic: CN 2-12 grossly intact with no focal deficits. Sensation intact in all 4 Extremities. Romberg sign cerebellar reflexes not assessed.  Psychiatric: Normal judgment and insight. Alert and oriented x 3. Anxious mood and appropriate affect.   Data Reviewed: I have personally reviewed following labs and imaging studies  CBC:  Recent Labs Lab 01/08/16 0138 01/08/16 0712 01/08/16 1233 01/08/16 1957 01/09/16 0326  WBC 11.4* 8.8 5.9 6.0 5.4  HGB 9.5* 8.6* 8.3* 7.8* 7.5*  HCT 29.3* 26.0* 24.9* 23.8* 23.1*  MCV 86.2 85.2 84.7 87.2 86.5  PLT 131* 89* 91* 75* 78*   Basic Metabolic Panel:  Recent Labs Lab 01/07/16 1748 01/08/16 0138 01/09/16 0326  NA 136 137 141  K 5.2* 4.5 3.8  CL 100* 102 108  CO2 24 22 27   GLUCOSE 381* 407* 142*  BUN 41* 52* 32*  CREATININE 1.02 1.08 0.98  CALCIUM 8.7* 8.8* 8.1*  MG  --  1.7  --   PHOS  --  2.6  --    GFR: Estimated Creatinine Clearance: 93.4 mL/min (by C-G formula based on SCr of 0.98 mg/dL). Liver Function Tests:  Recent Labs Lab 01/02/16 1405 01/07/16 1748 01/08/16 0138  AST 19 24 26   ALT 11 17 16*  ALKPHOS 78 55 56  BILITOT 1.1 1.5* 1.5*  PROT 7.3 6.4* 6.6  ALBUMIN 3.9 3.6 3.5   No results for  input(s): LIPASE, AMYLASE in the last 168 hours. No results for input(s): AMMONIA in the last 168 hours. Coagulation Profile:  Recent Labs Lab 01/07/16 1748 01/08/16 0138  INR 1.22 1.22   Cardiac Enzymes: No results for input(s): CKTOTAL, CKMB, CKMBINDEX, TROPONINI in the last 168 hours. BNP (last 3 results) No results for input(s): PROBNP in the last 8760 hours. HbA1C:  Recent Labs  01/08/16 0138  HGBA1C 8.4*   CBG:  Recent Labs Lab 01/08/16 1142 01/08/16 1617 01/08/16 2024 01/08/16 2343 01/09/16 0357  GLUCAP 236* 289* 294* 191* 142*   Lipid Profile: No results for input(s): CHOL, HDL, LDLCALC, TRIG, CHOLHDL, LDLDIRECT in the last 72 hours. Thyroid Function Tests:  Recent Labs  01/08/16 0138  TSH 1.525   Anemia Panel: No results for input(s): VITAMINB12, FOLATE, FERRITIN, TIBC, IRON, RETICCTPCT in the last 72 hours. Sepsis Labs: No results for input(s): PROCALCITON,  LATICACIDVEN in the last 168 hours.  Recent Results (from the past 240 hour(s))  MRSA PCR Screening     Status: None   Collection Time: 01/07/16 11:47 PM  Result Value Ref Range Status   MRSA by PCR NEGATIVE NEGATIVE Final    Comment:        The GeneXpert MRSA Assay (FDA approved for NASAL specimens only), is one component of a comprehensive MRSA colonization surveillance program. It is not intended to diagnose MRSA infection nor to guide or monitor treatment for MRSA infections.     Radiology Studies: Dg Chest 2 View  Result Date: 01/08/2016 CLINICAL DATA:  Leukocytosis EXAM: CHEST  2 VIEW COMPARISON:  03/04/2014 FINDINGS: Cardiac shadow is within normal limits. The lungs are well aerated bilaterally. Minimal platelike atelectasis is noted in the left lung base. No acute bony abnormality is seen. IMPRESSION: Minimal left basilar atelectasis. Electronically Signed   By: Inez Catalina M.D.   On: 01/08/2016 15:34   Scheduled Meds: . folic acid  1 mg Oral Daily  . gabapentin  300 mg Oral  QHS  . [START ON 01/10/2016] Influenza vac split quadrivalent PF  0.5 mL Intramuscular Tomorrow-1000  . insulin aspart  0-9 Units Subcutaneous Q4H  . LORazepam  0-4 mg Intravenous Q6H   Followed by  . [START ON 01/10/2016] LORazepam  0-4 mg Intravenous Q12H  . multivitamin with minerals  1 tablet Oral Daily  . sodium chloride flush  3 mL Intravenous Q12H  . thiamine  100 mg Oral Daily   Or  . thiamine  100 mg Intravenous Daily   Continuous Infusions: . pantoprozole (PROTONIX) infusion 8 mg/hr (01/08/16 2052)    LOS: 2 days   Kerney Elbe, DO [facility] Pager [phone]  If 7PM-7AM, please contact night-coverage [email or web address] Password [email or web address] 01/09/2016, 7:23 AM

## 2016-01-10 LAB — COMPREHENSIVE METABOLIC PANEL
ALT: 14 U/L — AB (ref 17–63)
AST: 26 U/L (ref 15–41)
Albumin: 3 g/dL — ABNORMAL LOW (ref 3.5–5.0)
Alkaline Phosphatase: 47 U/L (ref 38–126)
Anion gap: 7 (ref 5–15)
BUN: 15 mg/dL (ref 6–20)
CHLORIDE: 106 mmol/L (ref 101–111)
CO2: 27 mmol/L (ref 22–32)
CREATININE: 0.87 mg/dL (ref 0.61–1.24)
Calcium: 8.1 mg/dL — ABNORMAL LOW (ref 8.9–10.3)
GFR calc non Af Amer: >60 mL/min (ref >60)
Glucose, Bld: 164 mg/dL — ABNORMAL HIGH (ref 65–99)
POTASSIUM: 3.6 mmol/L (ref 3.5–5.1)
SODIUM: 140 mmol/L (ref 135–145)
Total Bilirubin: 1.1 mg/dL (ref 0.3–1.2)
Total Protein: 5.5 g/dL — ABNORMAL LOW (ref 6.5–8.1)

## 2016-01-10 LAB — CBC WITH DIFFERENTIAL/PLATELET
Basophils Absolute: 0 K/uL (ref 0.0–0.1)
Basophils Relative: 0 %
Eosinophils Absolute: 0.1 K/uL (ref 0.0–0.7)
Eosinophils Relative: 3 %
HCT: 23.3 % — ABNORMAL LOW (ref 39.0–52.0)
Hemoglobin: 7.6 g/dL — ABNORMAL LOW (ref 13.0–17.0)
Lymphocytes Relative: 39 %
Lymphs Abs: 1.7 K/uL (ref 0.7–4.0)
MCH: 28.7 pg (ref 26.0–34.0)
MCHC: 32.6 g/dL (ref 30.0–36.0)
MCV: 87.9 fL (ref 78.0–100.0)
Monocytes Absolute: 0.4 K/uL (ref 0.1–1.0)
Monocytes Relative: 9 %
Neutro Abs: 2.1 K/uL (ref 1.7–7.7)
Neutrophils Relative %: 49 %
Platelets: 75 K/uL — ABNORMAL LOW (ref 150–400)
RBC: 2.65 MIL/uL — ABNORMAL LOW (ref 4.22–5.81)
RDW: 18.3 % — ABNORMAL HIGH (ref 11.5–15.5)
WBC: 4.3 K/uL (ref 4.0–10.5)

## 2016-01-10 LAB — GLUCOSE, CAPILLARY
GLUCOSE-CAPILLARY: 167 mg/dL — AB (ref 65–99)
Glucose-Capillary: 150 mg/dL — ABNORMAL HIGH (ref 65–99)
Glucose-Capillary: 150 mg/dL — ABNORMAL HIGH (ref 65–99)
Glucose-Capillary: 231 mg/dL — ABNORMAL HIGH (ref 65–99)
Glucose-Capillary: 260 mg/dL — ABNORMAL HIGH (ref 65–99)

## 2016-01-10 LAB — HEMOGLOBIN A1C
Hgb A1c MFr Bld: 8.4 % — ABNORMAL HIGH (ref 4.8–5.6)
Mean Plasma Glucose: 194 mg/dL

## 2016-01-10 LAB — URINE CULTURE

## 2016-01-10 LAB — MAGNESIUM: Magnesium: 1.6 mg/dL — ABNORMAL LOW (ref 1.7–2.4)

## 2016-01-10 LAB — PHOSPHORUS: Phosphorus: 4.5 mg/dL (ref 2.5–4.6)

## 2016-01-10 MED ORDER — MAGNESIUM SULFATE 2 GM/50ML IV SOLN
2.0000 g | Freq: Once | INTRAVENOUS | Status: AC
Start: 2016-01-10 — End: 2016-01-10
  Administered 2016-01-10: 2 g via INTRAVENOUS
  Filled 2016-01-10: qty 50

## 2016-01-10 MED ORDER — PANTOPRAZOLE SODIUM 40 MG PO TBEC
40.0000 mg | DELAYED_RELEASE_TABLET | Freq: Two times a day (BID) | ORAL | Status: DC
  Administered 2016-01-10 – 2016-01-11 (×2): 40 mg via ORAL
  Filled 2016-01-10 (×2): qty 1

## 2016-01-10 NOTE — Progress Notes (Signed)
PROGRESS NOTE    Patrick Kidd  [id number] DOB: Aug 31, 1945 DOA: 01/07/2016 PCP: Patrick Po, FNP   Brief Narrative:  [name] is a 70 year old obese Caucasian male with a PMH of Alcoholic Cirrhosis, Diabetes Mellitus Type 2, Hypertension and other comorbidities who presented with an acute upper GI Bleed with presenting complaints of Melena and Coffee-ground Emesis. He underwent an EGD and was found to have a Duodenal Ulcer which was non-bleeding but with visible vessel s/p injection and Cautery.  He is currently on a Protonix gtt and being monitored for EtOH withdrawal. Protonix gtt will be stopped tonight and patient will be transitioned to Kidd PPI BID. He is tolerating his diet so far and it is being advanced per Gi.   Assessment & Plan:   Principal Problem:   GI bleed Active Problems:   Thrombocytopenia ([place])   Type 2 diabetes mellitus ([place])   Alcoholic cirrhosis ([facility])   GI bleeding   Duodenal ulcer with hemorrhage   Acute GI bleeding  Acute Blood Loss Anemia from Upper GIB from Duodenal Ulcer s/p EGD with Injection and Cautery -S/p EGD with Injection and Bipolar Cautery; No further bleeding observed  -C/w with Protonix gtt at 8 mg/hr for total of 72 hours per GI and transition to Kidd Protonix Kidd BID Long Term for 6-8 weeks. Protonix gtt to be D/C'd tonight at 1800 and Kidd will be started.  -Hb/Hct went from 8.3/24.9 -> 7.8/23.8 -> 7.5/23.1 -> 7.6/23.3 -Continue Supportive Measures and Avoid NSAIDs and EtOH use -Clear Liquids advanced to Full Liquids per GI; Advance as Tolerated per GI- Possible Advancement to soft diet tonight or AM -Pathology of EGD still Pending -Repeat CBC in AM -Outpatient GI Follow Up with Dr. Carlean Kidd at D/C; Possibly tomorrow if patient remains stable.  Type 2 Diabetes Mellitus complicated by Diabetic Neuropathy -Hemoglobin A1c was 6.7 on 08/16/2015.  -Repeat hemoglobin A1c was 8.4.  -CBGs have ranged from 150-285. Metformin Held in  Hospital -C/w Sensitive SSI q4h -C/w Gabapentin 300 mg Kidd 4 Times Dailyprn  Chest Discomfort/Abdominal Pain with Hx of Flatulence, Eructation and Gas Pain -No Reoccurrence; Troponin was <0.03 -Added Maalox for Indigestion/Heartburn/Flatulence yesterday -STAT EKG showed no evidence of ST Elevation or Depression on My Interpretation -C/w Telemetry and EKG's as needed for Chest Pain  Hypomagnesemia -Patient's Mag level was 1.6 -Repleted with 2 grams of IV Mag Sulfate -Repeat Mag Level in AM  Alcoholic Cirrhosis -EGD showed Portal Hypertensive Gastropathy -Patient with chronically elevated LFTs however improved with AST being 26 and ALT being 14.  -Alcohol cessation Counseling given again -Pain management and Supportive care. -Repeat CMP in AM -Follow up with Dr. Carlean Kidd in [facility] as an Outpatient  Thrombocytopenia -Patient's Plt Count went from 75 -> 78 -> 75 -Likely from Alcohol Abuse and Splenic Sequestration -Repeat CBC in AM  Alcohol Abuse with Concerns for Withdrawal -IV Zofran for Nausea/Vomiting -C/w Lorazepam IV CIWA protocol. -C/w MVI 1 tab Kidd Daily, Thiamine 100 mg Kidd Daily, and Folic Acid 1 mg Kidd Daily   GERD -C/w with Protonix gtt and transition to Kidd BID after 72 hours -Protonix gtt will be discontinued at 1800 today; Will switch to Kidd PPI 40 mEQ BID   DVT prophylaxis: SCDs Code Status: FULL Family Communication: No Family at Bedside Disposition Plan: Likely Home in AM  Consultants:   Gastroenterology Dr. Fuller Plan (01/08/2016)  Procedures: Upper Endoscopy on 01/08/2016  Antimicrobials: None  Subjective: Seen and examined at beside and he  was eating his [name]. States the bed was extremely uncomfortable to sleep in last night. Has not had problems overnight and not noticed anymore blood. No CP/SOB/N/V. No other concerns or complaints  Objective: Vitals:   01/09/16 1700 01/09/16 1856 01/09/16 2035 01/10/16 1100  BP:  111/61 (!)  154/75 (!) 142/66  Pulse:  87 95 92  Resp:  20 20 18   Temp: 98.8 F (37.1 C) 98.3 F (36.8 C) 98.3 F (36.8 C) 98 F (36.7 C)  TempSrc: Oral Oral Oral Oral  SpO2:  100% 100% 100%  Weight:      Height:        Intake/Output Summary (Last 24 hours) at 01/10/16 1506 Last data filed at 01/10/16 0600  Gross per 24 hour  Intake              375 ml  Output                0 ml  Net              375 ml   Filed Weights   01/07/16 2352 01/08/16 0908 01/09/16 0356  Weight: 114.4 kg (252 lb 3.3 oz) 105.2 kg (232 lb) 115.5 kg (254 lb 10.1 oz)   Examination: Physical Exam:  Constitutional: WN/WD, NAD and appears calm and comfortable Eyes: Lids and conjunctivae normal, sclerae anicteric  ENMT: External Ears, Nose appear normal. Grossly normal hearing.  Neck: Appears normal, supple, no cervical masses, normal ROM, no appreciable thyromegaly Respiratory: Clear to auscultation bilaterally, no wheezing, rales, rhonchi or crackles. Normal respiratory effort and patient is not tachypenic. No accessory muscle use.  Cardiovascular: RRR, no murmurs / rubs / gallops. S1 and S2 auscultated. No extremity edema. Abdomen: Soft, non-tender, non-distended. No masses palpated. No appreciable hepatosplenomegaly or ascites. Bowel sounds positive x4.  GU: Deferred. Musculoskeletal: No clubbing / cyanosis of digits/nails. No joint deformity upper and lower extremities. No contractures. Normal strength and muscle tone.  Skin: No rashes, lesions, ulcers on limited skin evaluation. No induration; Warm and dry.  Neurologic: CN 2-12 grossly intact with no focal deficits. Sensation intact in all 4 Extremities. Romberg sign cerebellar reflexes not assessed.  Psychiatric: Normal judgment and insight. Alert and oriented x 3. Normal mood and appropriate affect.   Data Reviewed: I have personally reviewed following labs and imaging studies  CBC:  Recent Labs Lab 01/08/16 0712 01/08/16 1233 01/08/16 1957  01/09/16 0326 01/10/16 0407  WBC 8.8 5.9 6.0 5.4 4.3  NEUTROABS  --   --   --   --  2.1  HGB 8.6* 8.3* 7.8* 7.5* 7.6*  HCT 26.0* 24.9* 23.8* 23.1* 23.3*  MCV 85.2 84.7 87.2 86.5 87.9  PLT 89* 91* 75* 78* 75*   Basic Metabolic Panel:  Recent Labs Lab 01/07/16 1748 01/08/16 0138 01/09/16 0326 01/10/16 0407  NA 136 137 141 140  K 5.2* 4.5 3.8 3.6  CL 100* 102 108 106  CO2 24 22 27 27   GLUCOSE 381* 407* 142* 164*  BUN 41* 52* 32* 15  CREATININE 1.02 1.08 0.98 0.87  CALCIUM 8.7* 8.8* 8.1* 8.1*  MG  --  1.7  --  1.6*  PHOS  --  2.6  --  4.5   GFR: Estimated Creatinine Clearance: 105.2 mL/min (by C-G formula based on SCr of 0.87 mg/dL). Liver Function Tests:  Recent Labs Lab 01/07/16 1748 01/08/16 0138 01/10/16 0407  AST 24 26 26   ALT 17 16* 14*  ALKPHOS 55 56 47  BILITOT 1.5* 1.5* 1.1  PROT 6.4* 6.6 5.5*  ALBUMIN 3.6 3.5 3.0*   No results for input(s): LIPASE, AMYLASE in the last 168 hours. No results for input(s): AMMONIA in the last 168 hours. Coagulation Profile:  Recent Labs Lab 01/07/16 1748 01/08/16 0138  INR 1.22 1.22   Cardiac Enzymes:  Recent Labs Lab 01/09/16 1338  TROPONINI <0.03   BNP (last 3 results) No results for input(s): PROBNP in the last 8760 hours. HbA1C:  Recent Labs  01/08/16 0138 01/09/16 0326  HGBA1C 8.4* 8.4*   CBG:  Recent Labs Lab 01/09/16 1703 01/09/16 2031 01/09/16 2343 01/10/16 0429 01/10/16 0749  GLUCAP 247* 285* 195* 150* 167*   Lipid Profile: No results for input(s): CHOL, HDL, LDLCALC, TRIG, CHOLHDL, LDLDIRECT in the last 72 hours. Thyroid Function Tests:  Recent Labs  01/08/16 0138  TSH 1.525   Anemia Panel: No results for input(s): VITAMINB12, FOLATE, FERRITIN, TIBC, IRON, RETICCTPCT in the last 72 hours. Sepsis Labs: No results for input(s): PROCALCITON, LATICACIDVEN in the last 168 hours.  Recent Results (from the past 240 hour(s))  MRSA PCR Screening     Status: None   Collection Time:  01/07/16 11:47 PM  Result Value Ref Range Status   MRSA by PCR NEGATIVE NEGATIVE Final    Comment:        The GeneXpert MRSA Assay (FDA approved for NASAL specimens only), is one component of a comprehensive MRSA colonization surveillance program. It is not intended to diagnose MRSA infection nor to guide or monitor treatment for MRSA infections.   Culture, Urine     Status: Abnormal   Collection Time: 01/08/16  1:45 PM  Result Value Ref Range Status   Specimen Description URINE, RANDOM  Final   Special Requests NONE  Final   Culture MULTIPLE SPECIES PRESENT, SUGGEST RECOLLECTION (A)  Final   Report Status 01/10/2016 FINAL  Final    Radiology Studies: Dg Chest 2 View  Result Date: 01/08/2016 CLINICAL DATA:  Leukocytosis EXAM: CHEST  2 VIEW COMPARISON:  03/04/2014 FINDINGS: Cardiac shadow is within normal limits. The lungs are well aerated bilaterally. Minimal platelike atelectasis is noted in the left lung base. No acute bony abnormality is seen. IMPRESSION: Minimal left basilar atelectasis. Electronically Signed   By: Inez Catalina M.D.   On: 01/08/2016 15:34   Scheduled Meds: . folic acid  1 mg Oral Daily  . gabapentin  300 mg Oral QHS  . insulin aspart  0-9 Units Subcutaneous Q4H  . LORazepam  0-4 mg Intravenous Q12H  . multivitamin with minerals  1 tablet Oral Daily  . pantoprazole  40 mg Oral BID  . sodium chloride flush  3 mL Intravenous Q12H  . thiamine  100 mg Oral Daily   Or  . thiamine  100 mg Intravenous Daily   Continuous Infusions: . pantoprozole (PROTONIX) infusion 8 mg/hr (01/10/16 0459)    LOS: 3 days   Kerney Elbe, DO [facility] Pager [phone]  If 7PM-7AM, please contact night-coverage [email or web address] Password [email or web address] 01/10/2016, 3:06 PM

## 2016-01-10 NOTE — Progress Notes (Signed)
[facility] Progress Note  CC:  Duodenal ulcer  Subjective:  Feels much better this AM.  Had a stool this AM that was still black (likely old blood).  No dizziness, went to the bathroom himself.  Would like to shower.   Objective:  Vital signs in last 24 hours: Temp:  [98.3 F (36.8 C)-98.8 F (37.1 C)] 98.3 F (36.8 C) (12/06 2035) Pulse Rate:  [87-95] 95 (12/06 2035) Resp:  [17-20] 20 (12/06 2035) BP: (111-154)/(40-75) 154/75 (12/06 2035) SpO2:  [96 %-100 %] 100 % (12/06 2035) Last BM Date: 01/07/16 General:  Alert, Well-developed, in NAD Heart:  Regular rate and rhythm; no murmurs Pulm:  CTAB.  No W/R/R. Abdomen:  Soft, non-distended.  BS present.  Non-tender. Extremities:  Without edema. Neurologic:  Alert and oriented x 4;  grossly normal neurologically. Psych:  Alert and cooperative. Normal mood and affect.  Intake/Output from previous day: 12/06 0701 - 12/07 0700 In: 1380 [P.O.:780; I.V.:600] Out: -   Lab Results:  Recent Labs  01/08/16 1957 01/09/16 0326 01/10/16 0407  WBC 6.0 5.4 4.3  HGB 7.8* 7.5* 7.6*  HCT 23.8* 23.1* 23.3*  PLT 75* 78* 75*   BMET  Recent Labs  01/08/16 0138 01/09/16 0326 01/10/16 0407  NA 137 141 140  K 4.5 3.8 3.6  CL 102 108 106  CO2 22 27 27   GLUCOSE 407* 142* 164*  BUN 52* 32* 15  CREATININE 1.08 0.98 0.87  CALCIUM 8.8* 8.1* 8.1*   LFT  Recent Labs  01/10/16 0407  PROT 5.5*  ALBUMIN 3.0*  AST 26  ALT 14*  ALKPHOS 47  BILITOT 1.1   PT/INR  Recent Labs  01/07/16 1748 01/08/16 0138  LABPROT 15.5* 15.5*  INR 1.22 1.22   Dg Chest 2 View  Result Date: 01/08/2016 CLINICAL DATA:  Leukocytosis EXAM: CHEST  2 VIEW COMPARISON:  03/04/2014 FINDINGS: Cardiac shadow is within normal limits. The lungs are well aerated bilaterally. Minimal platelike atelectasis is noted in the left lung base. No acute bony abnormality is seen. IMPRESSION: Minimal left basilar atelectasis. Electronically Signed    By: Inez Catalina M.D.   On: 01/08/2016 15:34    Assessment / Plan: 1. Duodenal Ulcer with viz vessel: with episode of melena, epigastric pain, vomiting/hematemesis-treated 12/5 at time of EGD with epi injection and BICAP.  Pt on Protonix drip. 2.  Acute blood loss anemia:  Hgb stable at 7.6 grams this AM.  Has not received transfusion at this point.  -Continue PPI drip for 72 hours.  Then will need to be discharged on BID PPI for 6-8 weeks then daily. -Discussed with patient that he should avoid NSAID use in the future if possible. -Will advance diet to full liquids for now.  ? If he can advance to soft diet this evening or tomorrow AM. -OK to shower from GI standpoint. -Monitor Hgb. -Possible discharge home sometime tomorrow if Hgb remains stable, etc.   LOS: 3 days   ZEHR, JESSICA D.  01/10/2016, 10:15 AM  Pager number [phone]     Attending physician's note   I have taken an interval history, reviewed the chart and examined the patient. I agree with the Advanced Practitioner's note, impression and recommendations. Duodenal ulcer with bleed. No recurrent bleeding. PPI bid for 6 weeks then PPI qam long term. Avoid ASA/NSAIDs long term. Should be ok for discharge tomorrow. Outpatient GI follow up with Dr. [name] in 3-4 weeks. GI signing off.   Norberto Sorenson  Fuller Plan, MD [facility] [phone] Mon-Fri 8a-5p [phone] after 5p, weekends, holidays

## 2016-01-11 LAB — COMPREHENSIVE METABOLIC PANEL
ALBUMIN: 3.1 g/dL — AB (ref 3.5–5.0)
ALK PHOS: 51 U/L (ref 38–126)
ALT: 15 U/L — AB (ref 17–63)
AST: 24 U/L (ref 15–41)
Anion gap: 6 (ref 5–15)
BUN: 11 mg/dL (ref 6–20)
CALCIUM: 8.3 mg/dL — AB (ref 8.9–10.3)
CO2: 27 mmol/L (ref 22–32)
CREATININE: 0.88 mg/dL (ref 0.61–1.24)
Chloride: 106 mmol/L (ref 101–111)
GFR calc Af Amer: >60 mL/min (ref >60)
GFR calc non Af Amer: >60 mL/min (ref >60)
GLUCOSE: 161 mg/dL — AB (ref 65–99)
Potassium: 3.6 mmol/L (ref 3.5–5.1)
SODIUM: 139 mmol/L (ref 135–145)
TOTAL PROTEIN: 5.7 g/dL — AB (ref 6.5–8.1)
Total Bilirubin: 1.2 mg/dL (ref 0.3–1.2)

## 2016-01-11 LAB — CBC WITH DIFFERENTIAL/PLATELET
BASOS PCT: 0 %
Basophils Absolute: 0 10*3/uL (ref 0.0–0.1)
EOS ABS: 0.1 10*3/uL (ref 0.0–0.7)
Eosinophils Relative: 3 %
HEMATOCRIT: 23.9 % — AB (ref 39.0–52.0)
HEMOGLOBIN: 7.7 g/dL — AB (ref 13.0–17.0)
Lymphocytes Relative: 33 %
Lymphs Abs: 1.6 10*3/uL (ref 0.7–4.0)
MCH: 28.6 pg (ref 26.0–34.0)
MCHC: 32.2 g/dL (ref 30.0–36.0)
MCV: 88.8 fL (ref 78.0–100.0)
MONOS PCT: 11 %
Monocytes Absolute: 0.5 10*3/uL (ref 0.1–1.0)
NEUTROS ABS: 2.5 10*3/uL (ref 1.7–7.7)
NEUTROS PCT: 53 %
Platelets: 83 10*3/uL — ABNORMAL LOW (ref 150–400)
RBC: 2.69 MIL/uL — AB (ref 4.22–5.81)
RDW: 18.5 % — ABNORMAL HIGH (ref 11.5–15.5)
WBC: 4.7 10*3/uL (ref 4.0–10.5)

## 2016-01-11 LAB — PHOSPHORUS: Phosphorus: 4 mg/dL (ref 2.5–4.6)

## 2016-01-11 LAB — MAGNESIUM: Magnesium: 1.8 mg/dL (ref 1.7–2.4)

## 2016-01-11 LAB — GLUCOSE, CAPILLARY
GLUCOSE-CAPILLARY: 158 mg/dL — AB (ref 65–99)
Glucose-Capillary: 150 mg/dL — ABNORMAL HIGH (ref 65–99)

## 2016-01-11 MED ORDER — FOLIC ACID 1 MG PO TABS: 1.0000 mg | ORAL_TABLET | Freq: Every day | ORAL | 0 refills | Status: DC

## 2016-01-11 MED ORDER — ADULT MULTIVITAMIN W/MINERALS CH: 1.0000 | ORAL_TABLET | Freq: Every day | ORAL | 0 refills | Status: DC

## 2016-01-11 MED ORDER — THIAMINE HCL 100 MG PO TABS: 100.0000 mg | ORAL_TABLET | Freq: Every day | ORAL | 0 refills | Status: DC

## 2016-01-11 MED ORDER — PANTOPRAZOLE SODIUM 40 MG PO TBEC: 40.0000 mg | DELAYED_RELEASE_TABLET | Freq: Two times a day (BID) | ORAL | 0 refills | Status: DC

## 2016-01-11 NOTE — Discharge Summary (Signed)
Physician Discharge Summary  Patrick Kidd [id number] DOB: [birth year] DOA: 01/07/2016  PCP: Mauricio Po, FNP  Admit date: 01/07/2016 Discharge date: 01/11/2016  Admitted From: Home Disposition: Home  Recommendations for Outpatient Follow-up:  1. Follow up with PCP in 1-2 weeks 2. Follow up with GI Dr. [name] in 3-4 weeks 3. Please obtain BMP/CBC in one week  Home Health: No Equipment/Devices: None  Discharge Condition: Stable CODE STATUS: FULL Diet recommendation: Heart Healthy / Carb Modified   Brief/Interim Summary: [name] is a 70 year old obese Caucasian male with a PMH of Alcoholic Cirrhosis, Diabetes Mellitus Type 2, Hypertension and other comorbidities who presented with an acute upper GI Bleed with presenting complaints of Melena and Coffee-ground Emesis. He underwent an EGD and was found to have a Duodenal Ulcer which was non-bleeding but with visible vessel s/p injection and Cautery. He was placed on a Protonix gtt and being monitored for EtOH withdrawal. Protonix gtt will be stopped last night and patient was transitioned to po PPI BID. He is tolerating his diet so far and it is being advanced per Gi and did well. Hemoglobin remained stable and no more concern for withdrawal. Patient is at this time medically stable to be D/C'd Home and Follow up with PCP and GI as an outpatient.   Discharge Diagnoses:  Principal Problem:   GI bleed Active Problems:   Thrombocytopenia ([place])   Type 2 diabetes mellitus ([place])   Alcoholic cirrhosis (H[facility])   GI bleeding   Duodenal ulcer with hemorrhage   Acute GI bleeding  Acute Blood Loss Anemia from Upper GIB from Duodenal Ulcer s/p EGD with Injection and Cautery -S/p EGD with Injection and Bipolar Cautery; No further bleeding observed  -C/w with Protonix gtt at 8 mg/hr for total of 72 hours per GI and transition to po Protonix po BID Long Term for 6-8 weeks. Protonix gtt to be D/C'd tonight at 1800 and po will be  started.  -Hb/Hct went from 8.3/24.9 -> 7.8/23.8 -> 7.5/23.1 -> 7.6/23.3 -> 7.7/23.9 -Continue Supportive Measures and Avoid NSAIDs and EtOH use -Advanced Diet to Soft Diet -Pathology of EGD still Pending -Repeat CBC as an Outpatient -Outpatient GI Follow Up with Dr. [name] at D/C in 3-4 weeks  Type 2 Diabetes Mellitus complicated by Diabetic Neuropathy -Hemoglobin A1c was 6.7 on 08/16/2015.  -Repeathemoglobin A1c was 8.4.  -CBGs have ranged from 150-285.Metformin Held in [place] Metformin at D/C and Discuss with PCP about another Agent potentially -C/w Gabapentin 300 mg po 4 Times Dailyprn  Chest Discomfort/Abdominal Pain with Hx of Flatulence, Eructation and Gas Pain -No Reoccurrence; Troponin was <0.03 -Added Maalox for Indigestion/Heartburn/Flatulence yesterday -STAT EKG showed no evidence of ST Elevation or Depression on My Interpretation -C/w Simethicone as an outpatient.   Hypomagnesemia, improved -Patient's Mag level went from 1.6 -> 1.8 -Repleted with 2 grams of IV Mag Sulfate yesterday  Alcoholic Cirrhosis -EGD showed Portal Hypertensive Gastropathy -Patient with chronically elevated LFTs however improved with AST being 26 and ALT being 14.  -Alcohol cessation Counseling given again -Pain management and Supportive care. -Repeat CMP as an Outpatient  -Follow up with Dr. [name] in [facility] as an Outpatient in 3-4 weeks  Thrombocytopenia -Patient's Plt Count went from 75 -> 78 -> 75 -> 83 -Likely from Alcohol Abuse and Splenic Sequestration -Repeat CBC as an out Patient  Alcohol Abuse with Concerns for Withdrawal; Improved -Given IV Zofran for Nausea/Vomiting in Hospital  -Was Placed on Lorazepam IV CIWAprotocol. -C/w  MVI 1 tab po Daily, Thiamine 100 mg po Daily, and Folic Acid 1 mg po Daily at D/C  GERD -Protonix gtt will be discontinued yesterday; Will switch to po PPI 40 mEQ BID at D/C  Discharge Instructions  Discharge  Instructions    Call MD for:  difficulty breathing, headache or visual disturbances    Complete by:  As directed    Call MD for:  persistant dizziness or light-headedness    Complete by:  As directed    Call MD for:  persistant nausea and vomiting    Complete by:  As directed    Call MD for:  severe uncontrolled pain    Complete by:  As directed    Diet - low sodium heart healthy    Complete by:  As directed    Discharge instructions    Complete by:  As directed    Follow up with Mauricio Po PCP in 1 week and with GI Dr. [name] in GI in 3-4 weeks. Take All medications as prescribed. If symptoms change or worsen please return to ER or PCP for re-evaluation.   Increase activity slowly    Complete by:  As directed        Medication List    STOP taking these medications   naproxen sodium 220 MG tablet Commonly known as:  ANAPROX   omeprazole 20 MG capsule Commonly known as:  PRILOSEC     TAKE these medications   ALIGN 4 MG Caps Take 4 mg by mouth daily.   famotidine 20 MG tablet Commonly known as:  PEPCID Take 20 mg by mouth at bedtime as needed for heartburn or indigestion.   folic acid 1 MG tablet Commonly known as:  FOLVITE Take 1 tablet (1 mg total) by mouth daily.   furosemide 40 MG tablet Commonly known as:  LASIX Take 1 tablet (40 mg total) by mouth as needed.   gabapentin 300 MG capsule Commonly known as:  NEURONTIN Take one capsule four times a day as needed.   metFORMIN 500 MG 24 hr tablet Commonly known as:  GLUCOPHAGE-XR Take 1 tablet (500 mg total) by mouth daily with breakfast.   multivitamin with minerals Tabs tablet Take 1 tablet by mouth daily.   pantoprazole 40 MG tablet Commonly known as:  PROTONIX Take 1 tablet (40 mg total) by mouth 2 (two) times daily.   sildenafil 20 MG tablet Commonly known as:  REVATIO 2-5 qd prn What changed:  how much to take  how to take this  when to take this  reasons to take this  additional  instructions   simethicone 80 MG chewable tablet Commonly known as:  GAS-X Chew 1 tablet (80 mg total) by mouth every 6 (six) hours as needed for flatulence.   thiamine 100 MG tablet Take 1 tablet (100 mg total) by mouth daily.      Follow-up Information    Mauricio Po, FNP Follow up in 1 week(s).   Specialty:  Family Medicine Why:  Call to [place] an Appointment Contact information: [place] [address] [phone]        Silvano Rusk, MD. Schedule an appointment as soon as possible for a visit in 1 week(s).   Specialty:  Gastroenterology Why:  3-4 Weeks. Call to Schedule an Appointment Contact information: [address] [place] [address] [phone]          No Known Allergies  Consultations:  Gastroenterology  Procedures/Studies: Dg Chest 2  View  Result Date: 01/08/2016 CLINICAL DATA:  Leukocytosis EXAM: CHEST  2 VIEW COMPARISON:  03/04/2014 FINDINGS: Cardiac shadow is within normal limits. The lungs are well aerated bilaterally. Minimal platelike atelectasis is noted in the left lung base. No acute bony abnormality is seen. IMPRESSION: Minimal left basilar atelectasis. Electronically Signed   By: Inez Catalina M.D.   On: 01/08/2016 15:34   Subjective: Was seen and examined at bedside and doing well. No Complaints. Ready to go home.   Discharge Exam: Vitals:   01/10/16 2124 01/11/16 0504  BP: 138/66 (!) 127/52  Pulse: 84 81  Resp: 18 18  Temp: 98.5 F (36.9 C) 98.8 F (37.1 C)   Vitals:   01/09/16 2035 01/10/16 1100 01/10/16 2124 01/11/16 0504  BP: (!) 154/75 (!) 142/66 138/66 (!) 127/52  Pulse: 95 92 84 81  Resp: 20 18 18 18   Temp: 98.3 F (36.8 C) 98 F (36.7 C) 98.5 F (36.9 C) 98.8 F (37.1 C)  TempSrc: Oral Oral Oral Oral  SpO2: 100% 100% 97% 95%  Weight:      Height:       General: Pt is alert, awake, not in acute distress Cardiovascular: RRR, S1/S2 +, no rubs, no gallops Respiratory: CTA bilaterally, no  wheezing, no rhonchi Abdominal: Soft, NT, ND, bowel sounds + Extremities: no edema, no cyanosis  The results of significant diagnostics from this hospitalization (including imaging, microbiology, ancillary and laboratory) are listed below for reference.    Microbiology: Recent Results (from the past 240 hour(s))  MRSA PCR Screening     Status: None   Collection Time: 01/07/16 11:47 PM  Result Value Ref Range Status   MRSA by PCR NEGATIVE NEGATIVE Final    Comment:        The GeneXpert MRSA Assay (FDA approved for NASAL specimens only), is one component of a comprehensive MRSA colonization surveillance program. It is not intended to diagnose MRSA infection nor to guide or monitor treatment for MRSA infections.   Culture, Urine     Status: Abnormal   Collection Time: 01/08/16  1:45 PM  Result Value Ref Range Status   Specimen Description URINE, RANDOM  Final   Special Requests NONE  Final   Culture MULTIPLE SPECIES PRESENT, SUGGEST RECOLLECTION (A)  Final   Report Status 01/10/2016 FINAL  Final     Labs: BNP (last 3 results) No results for input(s): BNP in the last 8760 hours. Basic Metabolic Panel:  Recent Labs Lab 01/07/16 1748 01/08/16 0138 01/09/16 0326 01/10/16 0407 01/11/16 0457  NA 136 137 141 140 139  K 5.2* 4.5 3.8 3.6 3.6  CL 100* 102 108 106 106  CO2 24 22 27 27 27   GLUCOSE 381* 407* 142* 164* 161*  BUN 41* 52* 32* 15 11  CREATININE 1.02 1.08 0.98 0.87 0.88  CALCIUM 8.7* 8.8* 8.1* 8.1* 8.3*  MG  --  1.7  --  1.6* 1.8  PHOS  --  2.6  --  4.5 4.0   Liver Function Tests:  Recent Labs Lab 01/07/16 1748 01/08/16 0138 01/10/16 0407 01/11/16 0457  AST 24 26 26 24   ALT 17 16* 14* 15*  ALKPHOS 55 56 47 51  BILITOT 1.5* 1.5* 1.1 1.2  PROT 6.4* 6.6 5.5* 5.7*  ALBUMIN 3.6 3.5 3.0* 3.1*   No results for input(s): LIPASE, AMYLASE in the last 168 hours. No results for input(s): AMMONIA in the last 168 hours. CBC:  Recent Labs Lab 01/08/16 1233  01/08/16 1957  01/09/16 0326 01/10/16 0407 01/11/16 0457  WBC 5.9 6.0 5.4 4.3 4.7  NEUTROABS  --   --   --  2.1 2.5  HGB 8.3* 7.8* 7.5* 7.6* 7.7*  HCT 24.9* 23.8* 23.1* 23.3* 23.9*  MCV 84.7 87.2 86.5 87.9 88.8  PLT 91* 75* 78* 75* 83*   Cardiac Enzymes:  Recent Labs Lab 01/09/16 1338  TROPONINI <0.03   BNP: Invalid input(s): POCBNP CBG:  Recent Labs Lab 01/10/16 1722 01/10/16 1949 01/10/16 2349 01/11/16 0342 01/11/16 0738  GLUCAP 260* 231* 150* 158* 150*   D-Dimer No results for input(s): DDIMER in the last 72 hours. Hgb A1c  Recent Labs  01/09/16 0326  HGBA1C 8.4*   Lipid Profile No results for input(s): CHOL, HDL, LDLCALC, TRIG, CHOLHDL, LDLDIRECT in the last 72 hours. Thyroid function studies No results for input(s): TSH, T4TOTAL, T3FREE, THYROIDAB in the last 72 hours.  Invalid input(s): FREET3 Anemia work up No results for input(s): VITAMINB12, FOLATE, FERRITIN, TIBC, IRON, RETICCTPCT in the last 72 hours. Urinalysis    Component Value Date/Time   COLORURINE YELLOW 01/08/2016 1344   APPEARANCEUR CLEAR 01/08/2016 1344   LABSPEC 1.021 01/08/2016 1344   PHURINE 5.0 01/08/2016 1344   GLUCOSEU >=500 (A) 01/08/2016 1344   GLUCOSEU NEGATIVE 11/15/2014 1516   HGBUR NEGATIVE 01/08/2016 1344   BILIRUBINUR NEGATIVE 01/08/2016 1344   KETONESUR NEGATIVE 01/08/2016 1344   PROTEINUR NEGATIVE 01/08/2016 1344   UROBILINOGEN 2.0 (A) 11/15/2014 1516   NITRITE NEGATIVE 01/08/2016 1344   LEUKOCYTESUR NEGATIVE 01/08/2016 1344   Sepsis Labs Invalid input(s): PROCALCITONIN,  WBC,  LACTICIDVEN Microbiology Recent Results (from the past 240 hour(s))  MRSA PCR Screening     Status: None   Collection Time: 01/07/16 11:47 PM  Result Value Ref Range Status   MRSA by PCR NEGATIVE NEGATIVE Final    Comment:        The GeneXpert MRSA Assay (FDA approved for NASAL specimens only), is one component of a comprehensive MRSA colonization surveillance program. It is  not intended to diagnose MRSA infection nor to guide or monitor treatment for MRSA infections.   Culture, Urine     Status: Abnormal   Collection Time: 01/08/16  1:45 PM  Result Value Ref Range Status   Specimen Description URINE, RANDOM  Final   Special Requests NONE  Final   Culture MULTIPLE SPECIES PRESENT, SUGGEST RECOLLECTION (A)  Final   Report Status 01/10/2016 FINAL  Final   Time coordinating discharge: Over 30 minutes  SIGNED:  Kerney Elbe, DO [facility] 01/11/2016, 9:14 AM Pager [phone]  If 7PM-7AM, please contact night-coverage [email or web address] Password [email or web address]

## 2016-01-14 ENCOUNTER — Ambulatory Visit (INDEPENDENT_AMBULATORY_CARE_PROVIDER_SITE_OTHER): Payer: Medicare Other | Admitting: Family

## 2016-01-14 ENCOUNTER — Encounter: Payer: Self-pay | Admitting: Family

## 2016-01-14 ENCOUNTER — Other Ambulatory Visit (INDEPENDENT_AMBULATORY_CARE_PROVIDER_SITE_OTHER): Payer: Medicare Other

## 2016-01-14 VITALS — BP 136/64 | HR 95 | Resp 18 | Ht 74.0 in | Wt 253.0 lb

## 2016-01-14 DIAGNOSIS — R4789 Other speech disturbances: Secondary | ICD-10-CM

## 2016-01-14 DIAGNOSIS — E1142 Type 2 diabetes mellitus with diabetic polyneuropathy: Secondary | ICD-10-CM | POA: Diagnosis not present

## 2016-01-14 DIAGNOSIS — K921 Melena: Secondary | ICD-10-CM | POA: Diagnosis not present

## 2016-01-14 LAB — CBC
HCT: 26.3 % — ABNORMAL LOW (ref 39.0–52.0)
MCHC: 33 g/dL (ref 30.0–36.0)
MCV: 85.9 fl (ref 78.0–100.0)
PLATELETS: 140 10*3/uL — AB (ref 150.0–400.0)
RBC: 3.06 Mil/uL — AB (ref 4.22–5.81)
RDW: 19.8 % — ABNORMAL HIGH (ref 11.5–15.5)
WBC: 5.2 10*3/uL (ref 4.0–10.5)

## 2016-01-14 LAB — COMPREHENSIVE METABOLIC PANEL
ALBUMIN: 3.6 g/dL (ref 3.5–5.2)
ALK PHOS: 69 U/L (ref 39–117)
ALT: 11 U/L (ref 0–53)
AST: 16 U/L (ref 0–37)
BILIRUBIN TOTAL: 0.7 mg/dL (ref 0.2–1.2)
BUN: 9 mg/dL (ref 6–23)
CO2: 25 mEq/L (ref 19–32)
CREATININE: 0.99 mg/dL (ref 0.40–1.50)
Calcium: 8.8 mg/dL (ref 8.4–10.5)
Chloride: 102 mEq/L (ref 96–112)
GFR: 79.15 mL/min (ref >60.00)
Glucose, Bld: 273 mg/dL — ABNORMAL HIGH (ref 70–99)
Potassium: 4.3 mEq/L (ref 3.5–5.1)
Sodium: 135 mEq/L (ref 135–145)
Total Protein: 6.7 g/dL (ref 6.0–8.3)

## 2016-01-14 LAB — AMMONIA: AMMONIA: 44 umol/L — AB (ref 11–35)

## 2016-01-14 MED ORDER — FREESTYLE FREEDOM LITE W/DEVICE KIT: PACK | 1 refills | Status: DC

## 2016-01-14 NOTE — Progress Notes (Signed)
 Subjective:    Patient ID: Patrick Kidd, male    DOB: 09/12/1945, 70 y.o.   MRN: [id number]  Chief Complaint  Patient presents with  . Hospitalization Follow-up    having memory problems since being out of the hospital     HPI:  Patrick Kidd is a 70 y.o. male who  has a past medical history of Acrophobia; Alcoholic cirrhosis (HCC) (10/04/2015); Arthritis; Cataract; Chronic cough; Diabetes mellitus without complication (HCC); Diverticulosis (07-03-2010); GERD (gastroesophageal reflux disease); Gout; Granuloma annulare; adenomatous colonic polyps (07-03-2010); Hydrocele; Nonspecific elevation of levels of transaminase or lactic acid dehydrogenase (LDH); Peripheral neuropathy (HCC); Plantar fasciitis; and Sleep apnea. and presents today for a hospitalization follow up.    Recently evaluated in the emergency department and admitted to the hospital for blood noted following a bowel movement and stools becoming black. Endorsed vomiting some emesis with some blood in it. Complained of feeling light headed at time. Physical exam with mild epigastric tenderness without rebound or guarding. Potassium noted to be 5.2 with a hemoglobin of 10.3. EKG showed sinus tachycardia. He was started on a Protonix drip and GI recommended endoscopy. Diagnosed with acute blood loss anemia from upper GI bleed from duodenal ulcer that was noted on EGD and injected and cauterized. Hemoglobin was trending upward following the procedure with the recommendation for follow up CBC as outpatient and follow up with GI. Type 2 diabetes with A1c of 8.4. Metformin restarted at discharge. Chest discomfort was resolved with Maalox for indigestion. Hospitalist recommends follow up CBC and BMET. All hospital labs, records,and imaging were reviewed in detail.   Since leaving the hospital he reports no further episodes of dark stools, melena, vomiting, hematochezia, or increased abdominal bloating/chest pain. Taking medications as  prescribed and denies adverse side effects. Has resumed his metformin with no complications. Zero alcohol intake since leaving the hospital. Does not occasional word finding challenge which appears to be improving since leaving the hospital. Overall reports feeling better. Is in need of a new blood sugar meter. He is currently on a soft foods diet for the next couple of weeks and has a follow up appointment with gastroenterology.    No Known Allergies    Outpatient Medications Prior to Visit  Medication Sig Dispense Refill  . famotidine (PEPCID) 20 MG tablet Take 20 mg by mouth at bedtime as needed for heartburn or indigestion.    . folic acid (FOLVITE) 1 MG tablet Take 1 tablet (1 mg total) by mouth daily. 30 tablet 0  . furosemide (LASIX) 40 MG tablet Take 1 tablet (40 mg total) by mouth as needed. 30 tablet 5  . gabapentin (NEURONTIN) 300 MG capsule Take one capsule four times a day as needed.     . metFORMIN (GLUCOPHAGE-XR) 500 MG 24 hr tablet Take 1 tablet (500 mg total) by mouth daily with breakfast. 30 tablet 1  . Multiple Vitamin (MULTIVITAMIN WITH MINERALS) TABS tablet Take 1 tablet by mouth daily. 30 tablet 0  . pantoprazole (PROTONIX) 40 MG tablet Take 1 tablet (40 mg total) by mouth 2 (two) times daily. 60 tablet 0  . Probiotic Product (ALIGN) 4 MG CAPS Take 4 mg by mouth daily.    . sildenafil (REVATIO) 20 MG tablet 2-5 qd prn (Patient taking differently: Take 20-100 mg by mouth daily as needed (ED). ) 50 tablet 1  . simethicone (GAS-X) 80 MG chewable tablet Chew 1 tablet (80 mg total) by mouth every 6 (six) hours as needed for   flatulence. 30 tablet 0  . thiamine 100 MG tablet Take 1 tablet (100 mg total) by mouth daily. 30 tablet 0   No facility-administered medications prior to visit.       Past Surgical History:  Procedure Laterality Date  . CATARACT EXTRACTION, BILATERAL  12/2011    Dr Shapiro  . COLONOSCOPY    . COLONOSCOPY W/ POLYPECTOMY  07/03/2010   2 adenomas,  diverticulosis on right. Dr Gessner  . ESOPHAGOGASTRODUODENOSCOPY (EGD) WITH PROPOFOL N/A 01/08/2016   Procedure: ESOPHAGOGASTRODUODENOSCOPY (EGD) WITH PROPOFOL;  Surgeon: Malcolm T Stark, MD;  Location: [facility];  Service: Endoscopy;  Laterality: N/A;  . FLEXIBLE SIGMOIDOSCOPY  2000  . POLYPECTOMY    . SIGMOIDOSCOPY    . WISDOM TOOTH EXTRACTION        Past Medical History:  Diagnosis Date  . Acrophobia   . Alcoholic cirrhosis (HCC) 10/04/2015  . Arthritis    foot by big toe  . Cataract    removed both eyes  . Chronic cough    PMH of  . Diabetes mellitus without complication (HCC)   . Diverticulosis 07-03-2010   Colonoscopy.   . GERD (gastroesophageal reflux disease)   . Gout   . Granuloma annulare   . Hx of adenomatous colonic polyps 07-03-2010   Colonoscopy. F/U due 2017  . Hydrocele   . Nonspecific elevation of levels of transaminase or lactic acid dehydrogenase (LDH)   . Peripheral neuropathy (HCC)   . Plantar fasciitis    PMH of  . Sleep apnea    no cpap      Review of Systems  Constitutional: Negative for chills and fever.  Eyes:       Negative for changes in vision.  Respiratory: Negative for cough, chest tightness, shortness of breath and wheezing.   Cardiovascular: Negative for chest pain, palpitations and leg swelling.  Endocrine: Negative for polydipsia, polyphagia and polyuria.  Neurological: Negative for dizziness, weakness, light-headedness and headaches.      Objective:    BP 136/64 (BP Location: Left Arm, Patient Position: Sitting, Cuff Size: Large)   Pulse 95   Resp 18   Ht 6' 2" (1.88 m)   Wt 253 lb (114.8 kg)   SpO2 97%   BMI 32.48 kg/m  Nursing note and vital signs reviewed.  Physical Exam  Constitutional: He is oriented to person, place, and time. He appears well-developed and well-nourished. No distress.  Cardiovascular: Normal rate, regular rhythm, normal heart sounds and intact distal pulses.   Pulmonary/Chest: Effort normal and  breath sounds normal.  Abdominal: Normal appearance and bowel sounds are normal. He exhibits no mass. There is no hepatosplenomegaly. There is no tenderness. There is no rigidity, no rebound, no guarding, no tenderness at McBurney's point and negative Murphy's sign.  Neurological: He is alert and oriented to person, place, and time. No cranial nerve deficit.  Skin: Skin is warm and dry.  Psychiatric: He has a normal mood and affect. His behavior is normal. Judgment and thought content normal.       Assessment & Plan:   Problem List Items Addressed This Visit      Digestive   GI bleeding - Primary    Gastrointestinal bleeding appears improved since leaving the hospital with no further episodes of hematochezia or melena. Has abstained from alcohol and NSAIDs. Obtain CBC and CMET. No further treatment necessary at this time pending CBC results. Follow up and changes per GI.       Relevant Orders     CBC (Completed)   Comprehensive metabolic panel (Completed)   Ammonia (Completed)     Endocrine   Type 2 diabetes mellitus (HCC)    A1c remains elevated at 8.4. Restart metformin with patient aware of the potential increased risk for lactic acidosis secondary to his liver cirrhosis. He will check his formulary for medications as raising metformin dosage will also increase risk. Recommend SGLT-2 inhibitor, GLP-1 or DPP-4 pending his insurance. New meter sent to pharmacy. Continues to have significant neuropathy of his bilateral lower extremities which is most likely diabetic and possibly alcohol related. Not on a statin secondary to liver cirrhosis. Refer to diabetic education for nutritional management.  Follow up pending formulary check. Monitor blood sugars every other day.       Relevant Orders   Ambulatory referral to diabetic education     Other   Word finding difficulty    Gradually improving word finding difficulty possibly related to decreased alcohol consumption. Continue thiamine and  folic acid. Not likely stroke or aphasia. If symptoms do not improve refer to neurology or SLP for further assessment and treatment. Will obtain ammonia levels to ensure not a part of progressing liver disease.           I am having Mr. Baird start on [place]. I am also having him maintain his simethicone, sildenafil, furosemide, metFORMIN, famotidine, gabapentin, ALIGN, folic acid, multivitamin with minerals, pantoprazole, and thiamine.   Meds ordered this encounter  Medications  . Blood Glucose Monitoring Suppl (FREESTYLE FREEDOM LITE) w/Device KIT    Sig: Use to check blood sugars 2 times daily    Dispense:  1 each    Refill:  1     Follow-up: Return in about 1 month (around 02/14/2016), or if symptoms worsen or fail to improve.  Mauricio Po, FNP

## 2016-01-14 NOTE — Assessment & Plan Note (Signed)
A1c remains elevated at 8.4. Restart metformin with patient aware of the potential increased risk for lactic acidosis secondary to his liver cirrhosis. He will check his formulary for medications as raising metformin dosage will also increase risk. Recommend SGLT-2 inhibitor, GLP-1 or DPP-4 pending his insurance. New meter sent to pharmacy. Continues to have significant neuropathy of his bilateral lower extremities which is most likely diabetic and possibly alcohol related. Not on a statin secondary to liver cirrhosis. Refer to diabetic education for nutritional management.  Follow up pending formulary check. Monitor blood sugars every other day.

## 2016-01-14 NOTE — Patient Instructions (Addendum)
Thank you for choosing [facility].  SUMMARY AND INSTRUCTIONS:  Medication:  Check your formulary.   Drugs to look at would be: Trulicity, Bydureon, Jardiance  Your prescription(s) have been submitted to your pharmacy or been printed and provided for you. Please take as directed and contact our office if you believe you are having problem(s) with the medication(s) or have any questions.  Labs:  Please stop by the lab on the lower level of the building for your blood work. Your results will be released to [place] (or called to you) after review, usually within 72 hours after test completion. If any changes need to be made, you will be notified at that same time.  1.) The lab is open from 7:30am to 5:30 pm Monday-Friday 2.) No appointment is necessary 3.) Fasting (if needed) is 6-8 hours after food and drink; black coffee and water are okay   Follow up:  If your symptoms worsen or fail to improve, please contact our office for further instruction, or in case of emergency go directly to the emergency room at the closest medical facility.

## 2016-01-14 NOTE — Assessment & Plan Note (Signed)
Gradually improving word finding difficulty possibly related to decreased alcohol consumption. Continue thiamine and folic acid. Not likely stroke or aphasia. If symptoms do not improve refer to neurology or SLP for further assessment and treatment. Will obtain ammonia levels to ensure not a part of progressing liver disease.

## 2016-01-14 NOTE — Assessment & Plan Note (Signed)
Gastrointestinal bleeding appears improved since leaving the hospital with no further episodes of hematochezia or melena. Has abstained from alcohol and NSAIDs. Obtain CBC and CMET. No further treatment necessary at this time pending CBC results. Follow up and changes per GI.

## 2016-01-16 ENCOUNTER — Telehealth: Payer: Self-pay

## 2016-01-16 NOTE — Telephone Encounter (Signed)
Called to let pt know handicap placard was ready for pick up.

## 2016-01-31 ENCOUNTER — Encounter: Payer: Self-pay | Admitting: Physician Assistant

## 2016-01-31 ENCOUNTER — Other Ambulatory Visit (INDEPENDENT_AMBULATORY_CARE_PROVIDER_SITE_OTHER): Payer: Medicare Other

## 2016-01-31 ENCOUNTER — Ambulatory Visit (INDEPENDENT_AMBULATORY_CARE_PROVIDER_SITE_OTHER): Payer: Medicare Other | Admitting: Physician Assistant

## 2016-01-31 VITALS — BP 118/60 | HR 70 | Ht 74.0 in

## 2016-01-31 DIAGNOSIS — K264 Chronic or unspecified duodenal ulcer with hemorrhage: Secondary | ICD-10-CM

## 2016-01-31 DIAGNOSIS — K7030 Alcoholic cirrhosis of liver without ascites: Secondary | ICD-10-CM | POA: Diagnosis not present

## 2016-01-31 LAB — CBC WITH DIFFERENTIAL/PLATELET
BASOS PCT: 0.3 % (ref 0.0–3.0)
Basophils Absolute: 0 10*3/uL (ref 0.0–0.1)
EOS PCT: 2.4 % (ref 0.0–5.0)
Eosinophils Absolute: 0.1 10*3/uL (ref 0.0–0.7)
HCT: 26.6 % — ABNORMAL LOW (ref 39.0–52.0)
Hemoglobin: 8.8 g/dL — ABNORMAL LOW (ref 13.0–17.0)
LYMPHS ABS: 1.8 10*3/uL (ref 0.7–4.0)
Lymphocytes Relative: 43.8 % (ref 12.0–46.0)
MCHC: 33.1 g/dL (ref 30.0–36.0)
MCV: 78.9 fl (ref 78.0–100.0)
MONO ABS: 0.3 10*3/uL (ref 0.1–1.0)
MONOS PCT: 8.1 % (ref 3.0–12.0)
NEUTROS ABS: 1.8 10*3/uL (ref 1.4–7.7)
NEUTROS PCT: 45.4 % (ref 43.0–77.0)
PLATELETS: 133 10*3/uL — AB (ref 150.0–400.0)
RBC: 3.37 Mil/uL — ABNORMAL LOW (ref 4.22–5.81)
RDW: 22 % — AB (ref 11.5–15.5)
WBC: 4 10*3/uL (ref 4.0–10.5)

## 2016-01-31 LAB — BASIC METABOLIC PANEL
BUN: 11 mg/dL (ref 6–23)
CALCIUM: 9.5 mg/dL (ref 8.4–10.5)
CO2: 28 mEq/L (ref 19–32)
CREATININE: 0.9 mg/dL (ref 0.40–1.50)
Chloride: 104 mEq/L (ref 96–112)
GFR: 88.34 mL/min (ref >60.00)
GLUCOSE: 192 mg/dL — AB (ref 70–99)
POTASSIUM: 4.1 meq/L (ref 3.5–5.1)
Sodium: 138 mEq/L (ref 135–145)

## 2016-01-31 LAB — PROTIME-INR
INR: 1.2 ratio — ABNORMAL HIGH (ref 0.8–1.0)
Prothrombin Time: 12.8 s (ref 9.6–13.1)

## 2016-01-31 MED ORDER — PANTOPRAZOLE SODIUM 40 MG PO TBEC: 40.0000 mg | DELAYED_RELEASE_TABLET | Freq: Two times a day (BID) | ORAL | 4 refills | Status: DC

## 2016-01-31 NOTE — Patient Instructions (Signed)
Please go to the basement level to have your labs drawn.   We sent refills to your pharmacy for Pantoprazole Sodium 40 .   We made you a follow up appointment with Dr. [name] for 03-25-2016 at 1:30 PM.

## 2016-01-31 NOTE — Progress Notes (Signed)
Subjective:    Patient ID: Patrick Kidd, male    DOB: 13-Oct-1945, 70 y.o.   MRN: [id number]  HPI Patrick Kidd is a pleasant 70 year old white male known to Dr. [name] who has history of alcohol-induced cirrhosis, chronic GERD, adult-onset diabetes mellitus and peripheral neuropathy. Patient was hospitalized 12 4 through 01/11/2016 with an acute GI bleed. He was seen in consultation by Dr. Fuller Plan and underwent EGD on 01/08/2016 with finding of portal hypertensive gastropathy, no esophageal varices, and a nonbleeding cratered duodenal ulcer with a visible vessel in the duodenal bulb. This was treated with injection and cautery. Biopsies for H. pylori were negative, hemoglobin at the time of discharge was 8.7 hematocrit of 26.3 and platelets of 140. Appendectomy did require transfusions during his hospital stay. Patient is seen in post hospital follow-up today. He says he is feeling very well, in fact better than he has felt for some time. He says his energy level is good and he is eating well. He has no complaints of abdominal pain and is not noted any melena or hematochezia since discharge from the hospital. He is also stopped drinking over the past month since he was admitted to the hospital and says he thinks he is finished for good. He has been taking Protonix 40 mg twice daily. His wife asks about his liver lesion. I reviewed an MRI from September 2017 with them which showed a large benign nonenhancing cyst within the right hepatic lobe measuring 6.9 cm and a lobular liver contour. There was no ascites.  Review of Systems Pertinent positive and negative review of systems were noted in the above HPI section.  All other review of systems was otherwise negative.  Outpatient Encounter Prescriptions as of 01/31/2016  Medication Sig  . Blood Glucose Monitoring Suppl (FREESTYLE FREEDOM LITE) w/Device KIT Use to check blood sugars 2 times daily  . famotidine (PEPCID) 20 MG tablet Take 20 mg by mouth at  bedtime as needed for heartburn or indigestion.  . folic acid (FOLVITE) 1 MG tablet Take 1 tablet (1 mg total) by mouth daily.  . furosemide (LASIX) 40 MG tablet Take 1 tablet (40 mg total) by mouth as needed.  . gabapentin (NEURONTIN) 300 MG capsule Take one capsule four times a day as needed.   . metFORMIN (GLUCOPHAGE-XR) 500 MG 24 hr tablet Take 1 tablet (500 mg total) by mouth daily with breakfast.  . Multiple Vitamin (MULTIVITAMIN WITH MINERALS) TABS tablet Take 1 tablet by mouth daily.  . pantoprazole (PROTONIX) 40 MG tablet Take 1 tablet (40 mg total) by mouth 2 (two) times daily.  . Probiotic Product (ALIGN) 4 MG CAPS Take 4 mg by mouth daily.  . sildenafil (REVATIO) 20 MG tablet 2-5 qd prn (Patient taking differently: Take 20-100 mg by mouth daily as needed (ED). )  . simethicone (GAS-X) 80 MG chewable tablet Chew 1 tablet (80 mg total) by mouth every 6 (six) hours as needed for flatulence.  . thiamine 100 MG tablet Take 1 tablet (100 mg total) by mouth daily.  . [DISCONTINUED] pantoprazole (PROTONIX) 40 MG tablet Take 1 tablet (40 mg total) by mouth 2 (two) times daily.   No facility-administered encounter medications on file as of 01/31/2016.    No Known Allergies Patient Active Problem List   Diagnosis Date Noted  . Word finding difficulty 01/14/2016  . Duodenal ulcer with hemorrhage   . Acute GI bleeding   . GI bleed 01/07/2016  . GI bleeding 01/07/2016  . Alcoholic  cirrhosis ([place]) 10/04/2015  . Liver cyst 10/04/2015  . Type 2 diabetes mellitus ([place]) 08/22/2015  . Rash and nonspecific skin eruption 08/22/2015  . Abdominal distention 08/22/2015  . Elevated PSA 11/18/2014  . Thrombocytopenia ([place]) 03/19/2014  . Hypersomnolence 03/19/2014  . Dyspnea 03/07/2014  . Plantar fasciitis of right foot 02/17/2014  . Obesity (BMI 30-39.9) 07/19/2013  . Peripheral neuropathy ([place]) 01/11/2013  . Polyclonal gammopathy 01/11/2013  . Impaired glucose tolerance 01/11/2013  . Edema  01/11/2013  . Hx of adenomatous colonic polyps 07/16/2012  . Chronic cough 05/31/2010  . GERD 10/04/2009  . History of gout 08/03/2006  . ACROPHOBIA 07/10/2006   Social History   Social History  . Marital status: Married    Spouse name: N/A  . Number of children: 2  . Years of education: N/A   Occupational History  . Commercial real estate    Social History Main Topics  . Smoking status: Former Smoker    Packs/day: 2.00    Years: 6.00    Types: Cigarettes    Quit date: 02/04/1971  . Smokeless tobacco: Never Used     Comment: smoked age [age], up to 2 ppd  . Alcohol use 12.6 oz/week    21 Shots of liquor per week     Comment: 2-3 a day  . Drug use: No  . Sexual activity: Yes    Partners: Female   Other Topics Concern  . Not on file   Social History Narrative  . No narrative on file    Patrick Kidd's family history includes Leukemia in his father; Lung cancer in his mother.      Objective:    Vitals:   01/31/16 1343  BP: 118/60  Pulse: 70    Physical Exam  well-developed older white male in no acute distress, company by his wife, both very pleasant blood pressure 118/60 pulse 70. HEENT ;nontraumatic normocephalic EOMI PERRLA sclera anicteric, Cardiovascular; regular rate and rhythm with S1-S2 no murmur or gallop, Pulmonary; clear bilaterally, Abdomen; large soft nontender nondistended no palpable mass or hepatosplenomegaly, Rectal ;exam not done, Extremities ;no edema does have some chronic vascular changes in his ankles bilaterally, Neuropsych ;mood and affect appropriate       Assessment & Plan:   #57 70 year old white male seen in post hospital follow-up after acute GI bleed secondary to a duodenal ulcer with visible vessel. Patient currently doing very well #2 portal hypertensive gastropathy, no varices on EGD done 01/08/2016 #3  anemia secondary to acute blood loss #4 alcohol-induced cirrhosis-patient had been actively drinking until 1 month ago #5  adult-onset diabetes mellitus with peripheral neuropathy #6 chronic GERD   Plan; Will repeat CBC today, check BMETand AFP Patient will continue Protonix 40 mg by mouth twice daily over the next 2 months then will go to once daily dosing Patient was commended for stopping alcohol and encouraged to continue complete abstinence indefinitely He will follow-up with Dr. [name] in 2-3 months.   Amy Genia Harold PA-C 01/31/2016   Cc: [name], FNP

## 2016-02-01 LAB — AFP TUMOR MARKER: AFP-Tumor Marker: 3.1 ng/mL (ref <6.1)

## 2016-02-01 NOTE — Progress Notes (Signed)
Agree with Ms. Patrick Kidd's assessment and plan. Patrick Longino E. Verneta Hamidi, MD, FACG   

## 2016-02-18 ENCOUNTER — Telehealth: Payer: Self-pay | Admitting: Emergency Medicine

## 2016-02-18 ENCOUNTER — Telehealth: Payer: Self-pay | Admitting: Family

## 2016-02-18 NOTE — Telephone Encounter (Signed)
[facility]  Patient Name: Patrick Kidd  DOB: October 03, 1945    Initial Comment Caller says that his blood sugar is 230 and has no symptoms. Wants to know if he should be concerned. He has type 2 diabetes.   Nurse Assessment  Nurse: Harlow Mares, RN, Suanne Marker Date/Time (Eastern Time): 02/18/2016 5:15:17 PM  Confirm and document reason for call. If symptomatic, describe symptoms. ---Caller says that his blood sugar is 230 and has no symptoms. Wants to know if he should be concerned. He has type 2 diabetes. Reports that he is taking Metformin.  Does the patient have any new or worsening symptoms? ---Yes  Will a triage be completed? ---Yes  Related visit to physician within the last 2 weeks? ---No  Does the PT have any chronic conditions? (i.e. diabetes, asthma, etc.) ---Yes  List chronic conditions. ---diabetes; hx bleeding ulcer (hospitalized recently)  Is this a behavioral health or substance abuse call? ---No     Guidelines    Guideline Title Affirmed Question Affirmed Notes  Diabetes - High Blood Sugar Blood glucose 60-240 mg/dl (3.5 -13 mmol/l) (all triage questions negative)    Final Disposition User   Nelson, RN, Suanne Marker    Disagree/Comply: Comply

## 2016-02-19 NOTE — Telephone Encounter (Signed)
error 

## 2016-02-28 ENCOUNTER — Other Ambulatory Visit: Payer: Self-pay | Admitting: Family

## 2016-02-28 DIAGNOSIS — E1142 Type 2 diabetes mellitus with diabetic polyneuropathy: Secondary | ICD-10-CM

## 2016-03-06 DIAGNOSIS — H04123 Dry eye syndrome of bilateral lacrimal glands: Secondary | ICD-10-CM | POA: Diagnosis not present

## 2016-03-06 DIAGNOSIS — Z961 Presence of intraocular lens: Secondary | ICD-10-CM | POA: Diagnosis not present

## 2016-03-06 DIAGNOSIS — E119 Type 2 diabetes mellitus without complications: Secondary | ICD-10-CM | POA: Diagnosis not present

## 2016-03-25 ENCOUNTER — Encounter (INDEPENDENT_AMBULATORY_CARE_PROVIDER_SITE_OTHER): Payer: Self-pay

## 2016-03-25 ENCOUNTER — Other Ambulatory Visit (INDEPENDENT_AMBULATORY_CARE_PROVIDER_SITE_OTHER): Payer: Medicare Other

## 2016-03-25 ENCOUNTER — Encounter: Payer: Self-pay | Admitting: Internal Medicine

## 2016-03-25 ENCOUNTER — Ambulatory Visit (INDEPENDENT_AMBULATORY_CARE_PROVIDER_SITE_OTHER): Payer: Medicare Other | Admitting: Internal Medicine

## 2016-03-25 ENCOUNTER — Encounter: Payer: Medicare Other | Attending: Family | Admitting: *Deleted

## 2016-03-25 VITALS — BP 118/66 | HR 92 | Ht 74.0 in | Wt 245.1 lb

## 2016-03-25 DIAGNOSIS — Z713 Dietary counseling and surveillance: Secondary | ICD-10-CM | POA: Diagnosis not present

## 2016-03-25 DIAGNOSIS — L989 Disorder of the skin and subcutaneous tissue, unspecified: Secondary | ICD-10-CM

## 2016-03-25 DIAGNOSIS — K7030 Alcoholic cirrhosis of liver without ascites: Secondary | ICD-10-CM | POA: Diagnosis not present

## 2016-03-25 DIAGNOSIS — E1142 Type 2 diabetes mellitus with diabetic polyneuropathy: Secondary | ICD-10-CM

## 2016-03-25 DIAGNOSIS — K260 Acute duodenal ulcer with hemorrhage: Secondary | ICD-10-CM

## 2016-03-25 DIAGNOSIS — D62 Acute posthemorrhagic anemia: Secondary | ICD-10-CM

## 2016-03-25 LAB — CBC WITH DIFFERENTIAL/PLATELET
BASOS PCT: 0.5 % (ref 0.0–3.0)
Basophils Absolute: 0 10*3/uL (ref 0.0–0.1)
EOS ABS: 0.1 10*3/uL (ref 0.0–0.7)
Eosinophils Relative: 1.9 % (ref 0.0–5.0)
HCT: 29.9 % — ABNORMAL LOW (ref 39.0–52.0)
Hemoglobin: 9.2 g/dL — ABNORMAL LOW (ref 13.0–17.0)
Lymphocytes Relative: 42 % (ref 12.0–46.0)
Lymphs Abs: 2 10*3/uL (ref 0.7–4.0)
MCHC: 30.8 g/dL (ref 30.0–36.0)
MCV: 67.6 fl — AB (ref 78.0–100.0)
MONO ABS: 0.5 10*3/uL (ref 0.1–1.0)
Monocytes Relative: 9.5 % (ref 3.0–12.0)
NEUTROS ABS: 2.2 10*3/uL (ref 1.4–7.7)
NEUTROS PCT: 46.1 % (ref 43.0–77.0)
PLATELETS: 135 10*3/uL — AB (ref 150.0–400.0)
RBC: 4.43 Mil/uL (ref 4.22–5.81)
RDW: 22.3 % — ABNORMAL HIGH (ref 11.5–15.5)
WBC: 4.7 10*3/uL (ref 4.0–10.5)

## 2016-03-25 NOTE — Patient Instructions (Addendum)
   CONGRATULATIONS ON STOPPING ALCOHOL SIR.   Your physician has requested that you go to the basement for the following lab work before leaving today: CBC, we will be in touch with results and plans.   Follow up with your Dermatologist about your skin lesion.  Decrease your pantoprazole to once daily.   I appreciate the opportunity to care for you. Silvano Rusk, MD, [facility]

## 2016-03-25 NOTE — Progress Notes (Signed)

## 2016-03-25 NOTE — Progress Notes (Signed)
   Patrick Kidd 71 y.o. 1945/05/31 [id number]  Assessment & Plan:   1. Acute duodenal ulcer with hemorrhage H. pylori biopsies negative   2. Acute blood loss anemia   3. Alcoholic cirrhosis of liver without ascites ([place])   4. Skin lesion    Recheck CBC. Continue abstinence from alcohol, he is congratulated I've asked him to follow-up with his dermatologist regarding skin lesion We'll work out follow-up when I see his labs probably 4-6 months  He needs repeat liver imaging a year from his MRI.  Subjective:   Chief Complaint: Follow-up of alcoholic liver disease/cirrhosis and  duodenal ulcer with bleed  HPI Patient is here with his wife, he has quit alcohol for 90 days and is congratulated. He does miss that he says. He feels about as well as ease had no a while. In early December he presented to the hospital with hematemesis and melena and was found to have a duodenal ulcer, with visible vessel and was treated with endoscopic therapy. He was seen in follow-up a couple weeks later and was improving his hemoglobin went from the 7 range to the 8 range. He is feeling stronger he is exercising. No more bleeding. He was H. pylori negative.   Wt Readings from Last 3 Encounters:  03/25/16 245 lb 2 oz (111.2 kg)  01/14/16 253 lb (114.8 kg)  01/09/16 254 lb 10.1 oz (115.5 kg)   Medications, allergies, past medical history, past surgical history, family history and social history are reviewed and updated in the EMR.  Review of Systems Has a small skin lesion on his right forearm, in the midst of his rash. He says it's a scab it will go away.  Objective:   Physical Exam BP 118/66   Pulse 92   Ht 6\' 2"  (1.88 m)   Wt 245 lb 2 oz (111.2 kg)   BMI 31.47 kg/m  Eyes are anicteric Lungs clear Heart S1-S2 no rubs or murmurs or gallops Abdomen obese soft nontender liver is firm and palpable in the right upper quadrant area towards the midline Extremities no edema cyanosis or  clubbing Skin he has this annular -like rash throughout his upper extremities and one of these areas in the right forearm on the dorsal surface there is a small firm raised area that has a dark spot to it. It's very small.   Data reviewed includes recent GI notes endoscopy reports, labs in the EMR

## 2016-03-27 DIAGNOSIS — L920 Granuloma annulare: Secondary | ICD-10-CM | POA: Diagnosis not present

## 2016-03-27 DIAGNOSIS — L720 Epidermal cyst: Secondary | ICD-10-CM | POA: Diagnosis not present

## 2016-03-27 NOTE — Progress Notes (Signed)
Hgb low and red cells small = iron deficiency anemia Needs ferrous sulfate 325 mg bid w/ food Recheck CBC in 2 mos please

## 2016-03-28 ENCOUNTER — Other Ambulatory Visit: Payer: Self-pay

## 2016-03-28 ENCOUNTER — Telehealth: Payer: Self-pay | Admitting: Gastroenterology

## 2016-03-28 DIAGNOSIS — D508 Other iron deficiency anemias: Secondary | ICD-10-CM

## 2016-03-28 NOTE — Telephone Encounter (Signed)
Patient notified that testing strips should come from his primary care.  He will contact [place] office for a refill.

## 2016-03-31 ENCOUNTER — Telehealth: Payer: Self-pay | Admitting: Family

## 2016-03-31 MED ORDER — GLUCOSE BLOOD VI STRP: ORAL_STRIP | 12 refills | Status: DC

## 2016-03-31 NOTE — Telephone Encounter (Signed)
Rx sent. Pt aware.  

## 2016-03-31 NOTE — Telephone Encounter (Signed)
Pt called request rx for test strips for freestyle lite send to [facility]. Please call him back once its done.

## 2016-04-01 ENCOUNTER — Ambulatory Visit: Payer: Medicare Other

## 2016-04-03 ENCOUNTER — Other Ambulatory Visit: Payer: Self-pay | Admitting: Neurology

## 2016-04-03 ENCOUNTER — Encounter: Payer: Medicare Other | Attending: Family | Admitting: Dietician

## 2016-04-03 DIAGNOSIS — Z713 Dietary counseling and surveillance: Secondary | ICD-10-CM | POA: Diagnosis not present

## 2016-04-03 DIAGNOSIS — E1142 Type 2 diabetes mellitus with diabetic polyneuropathy: Secondary | ICD-10-CM | POA: Insufficient documentation

## 2016-04-03 DIAGNOSIS — E118 Type 2 diabetes mellitus with unspecified complications: Secondary | ICD-10-CM

## 2016-04-03 NOTE — Progress Notes (Signed)

## 2016-04-08 ENCOUNTER — Ambulatory Visit: Payer: Medicare Other

## 2016-04-10 ENCOUNTER — Encounter: Payer: Medicare Other | Admitting: Dietician

## 2016-04-10 DIAGNOSIS — Z713 Dietary counseling and surveillance: Secondary | ICD-10-CM | POA: Diagnosis not present

## 2016-04-10 DIAGNOSIS — E118 Type 2 diabetes mellitus with unspecified complications: Secondary | ICD-10-CM

## 2016-04-10 DIAGNOSIS — E1142 Type 2 diabetes mellitus with diabetic polyneuropathy: Secondary | ICD-10-CM | POA: Diagnosis not present

## 2016-04-10 NOTE — Progress Notes (Signed)
Patient was seen on 04/10/16 for the third of a series of three diabetes self-management courses at [facility].   Catalina Gravel the amount of activity recommended for healthy living . Describe activities suitable for individual needs . Identify ways to regularly incorporate activity into daily life . Identify barriers to activity and ways to over come these barriers  Identify diabetes medications being personally used and their primary action for lowering glucose and possible side effects . Describe role of stress on blood glucose and develop strategies to address psychosocial issues . Identify diabetes complications and ways to prevent them  Explain how to manage diabetes during illness . Evaluate success in meeting personal goal . Establish 2-3 goals that they will plan to diligently work on until they return for the  40-month follow-up visit  Goals:   I will be active 90 minutes or more a week  I will take my diabetes medications as scheduled  I will test my glucose at least 3 times a week  Your patient has identified these potential barriers to change:  none  Your patient has identified their diabetes self-care support plan as  Family Support  Plan:  Attend Support Group as desired

## 2016-06-04 ENCOUNTER — Other Ambulatory Visit (INDEPENDENT_AMBULATORY_CARE_PROVIDER_SITE_OTHER): Payer: Medicare Other

## 2016-06-04 DIAGNOSIS — D508 Other iron deficiency anemias: Secondary | ICD-10-CM

## 2016-06-04 LAB — CBC WITH DIFFERENTIAL/PLATELET
BASOS ABS: 0 10*3/uL (ref 0.0–0.1)
BASOS PCT: 0.3 % (ref 0.0–3.0)
EOS PCT: 1.4 % (ref 0.0–5.0)
Eosinophils Absolute: 0.1 10*3/uL (ref 0.0–0.7)
HEMATOCRIT: 45.6 % (ref 39.0–52.0)
Hemoglobin: 15.6 g/dL (ref 13.0–17.0)
LYMPHS PCT: 47.1 % — AB (ref 12.0–46.0)
Lymphs Abs: 2.8 10*3/uL (ref 0.7–4.0)
MCHC: 34.2 g/dL (ref 30.0–36.0)
MCV: 81.4 fl (ref 78.0–100.0)
MONOS PCT: 7 % (ref 3.0–12.0)
Monocytes Absolute: 0.4 10*3/uL (ref 0.1–1.0)
NEUTROS ABS: 2.6 10*3/uL (ref 1.4–7.7)
Neutrophils Relative %: 44.2 % (ref 43.0–77.0)
PLATELETS: 92 10*3/uL — AB (ref 150.0–400.0)
RBC: 5.6 Mil/uL (ref 4.22–5.81)
RDW: 23.3 % — AB (ref 11.5–15.5)
WBC: 6 10*3/uL (ref 4.0–10.5)

## 2016-06-04 NOTE — Progress Notes (Signed)
Blood count normal! Stay on ferrous sulfate qd is enough See me in June or July

## 2016-06-24 ENCOUNTER — Other Ambulatory Visit: Payer: Self-pay | Admitting: Physician Assistant

## 2016-06-24 DIAGNOSIS — L920 Granuloma annulare: Secondary | ICD-10-CM | POA: Diagnosis not present

## 2016-06-25 ENCOUNTER — Ambulatory Visit (INDEPENDENT_AMBULATORY_CARE_PROVIDER_SITE_OTHER): Payer: Medicare Other | Admitting: Family Medicine

## 2016-06-25 ENCOUNTER — Other Ambulatory Visit: Payer: Self-pay | Admitting: Family Medicine

## 2016-06-25 ENCOUNTER — Encounter (HOSPITAL_COMMUNITY): Payer: Self-pay | Admitting: Family Medicine

## 2016-06-25 ENCOUNTER — Encounter: Payer: Self-pay | Admitting: Family Medicine

## 2016-06-25 VITALS — BP 142/82 | HR 76 | Ht 74.0 in | Wt 246.0 lb

## 2016-06-25 DIAGNOSIS — M79672 Pain in left foot: Secondary | ICD-10-CM | POA: Diagnosis not present

## 2016-06-25 DIAGNOSIS — M79671 Pain in right foot: Secondary | ICD-10-CM

## 2016-06-25 DIAGNOSIS — M216X9 Other acquired deformities of unspecified foot: Secondary | ICD-10-CM | POA: Diagnosis not present

## 2016-06-25 DIAGNOSIS — G6289 Other specified polyneuropathies: Secondary | ICD-10-CM

## 2016-06-25 DIAGNOSIS — I739 Peripheral vascular disease, unspecified: Secondary | ICD-10-CM | POA: Diagnosis not present

## 2016-06-25 MED ORDER — CILOSTAZOL 50 MG PO TABS: 50.0000 mg | ORAL_TABLET | Freq: Two times a day (BID) | ORAL | 1 refills | Status: DC

## 2016-06-25 MED ORDER — VITAMIN D (ERGOCALCIFEROL) 1.25 MG (50000 UNIT) PO CAPS: 50000.0000 [IU] | ORAL_CAPSULE | ORAL | 0 refills | Status: DC

## 2016-06-25 NOTE — Patient Instructions (Addendum)
Good to see you.  We will check the blood flow to your feet. They will call you.  Once weekly vitamin D for 12 weeks.  Pletal 2 times a day to see if we can help the blood flow of the feet.  Over the counter  Take 500mg  of vitamin C with your iron  B12 1024mcg daily  Tart cherry extract any dose at night  We will get you in orthotics and call you when they are here.  See me again in 4 weeks.

## 2016-06-25 NOTE — Assessment & Plan Note (Signed)
Breakdown of the transverse arch bilaterally. Discussed over-the-counter orthotics and patient will be set up for custom orthotics. Do not feel that this is the highest priority with patient's other comorbidities. I do think that will be beneficial dome and made patient's improvement some of his activities of daily living.

## 2016-06-25 NOTE — Assessment & Plan Note (Signed)
Concerned than patient likely does have more of a peripheral vascular disease. I believe the patient does have decreasing blood flow in the feet bilaterally. Likely seems to be chronic. Also does have a past medical history significant for peripheral neuropathy. Discussed with patient at great length. Patient will be set up for an ABI. This would be very helpful to see how much of this is plain a role. Depending on findings seem a need referral to vascular surgeon. With the breakdown of the transverse arch is continuing to have pain we'll consider getting patient him fitted for custom orthotics and given home exercises. Discussed over-the-counter medications a could be beneficial.

## 2016-06-25 NOTE — Assessment & Plan Note (Signed)
May need to discuss the possibility of changing medication. Patient was to continue gabapentin at this time.

## 2016-06-25 NOTE — Progress Notes (Signed)
[name] [place],  [address] Phone: [phone] Subjective:    I'm seeing this patient by the request  of:    CC: Bilateral foot pain  [id number]  Patrick Kidd is a 71 y.o. male coming in with complaint of bilateral foot pain. Past medical history is significant for an thrombocytopenia, diabetes, alcoholic cirrhosis, peripheral neuropathy as well as plantar fasciitis. Was seen by me greater than 2 years ago and was to do conservative therapy. Patient states His been worsening. Has tried over-the-counter orthotics, is on large doses of gabapentin with no significant improvement. Patient has seen multiple providers and continues to not have any true breakthrough. Rates the severity pain is 8 out of 10. Concerned though because he is having difficulty even walking greater than 200 feet without significant amount of pain. Seems to get a little better with sitting but still has pain as well. Affecting daily activity.     Past Medical History:  Diagnosis Date  . Acrophobia   . Alcoholic cirrhosis ([place]) 5/73/2202  . Arthritis    foot by big toe  . Cataract    removed both eyes  . Chronic cough    PMH of  . Diabetes mellitus without complication ([place])   . Diverticulosis 07-03-2010   Colonoscopy.   . Duodenal ulcer hemorrhage   . GERD (gastroesophageal reflux disease)   . Gout   . Granuloma annulare   . Hx of adenomatous colonic polyps multiple  . Hydrocele   . Nonspecific elevation of levels of transaminase or lactic acid dehydrogenase (LDH)   . Peripheral neuropathy ([place])   . Plantar fasciitis    PMH of  . Sleep apnea    no cpap   Past Surgical History:  Procedure Laterality Date  . CATARACT EXTRACTION, BILATERAL  12/2011    Dr Gershon Crane  . COLONOSCOPY    . COLONOSCOPY W/ POLYPECTOMY  07/03/2010   2 adenomas, diverticulosis on right. Dr [name]  . ESOPHAGOGASTRODUODENOSCOPY (EGD) WITH PROPOFOL N/A 01/08/2016   Procedure: ESOPHAGOGASTRODUODENOSCOPY (EGD) WITH PROPOFOL;  Surgeon: [name], MD;  Location: [facility] END[facility]COPY;  Service: Endoscopy;  Laterality: N/A;  . FLEXIBLE SIGMOIDOSCOPY  2000  . SIGMOIDOSCOPY    . WISDOM TOOTH EXTRACTION     Social History   Social History  . Marital status: Married    Spouse name: N/A  . Number of children: 2  . Years of education: N/A   Occupational History  . Commercial real estate    Social History Main Topics  . Smoking status: Former Smoker    Packs/day: 2.00    Years: 6.00    Types: Cigarettes    Quit date: 02/04/1971  . Smokeless tobacco: Never Used     Comment: smoked age [age], up to 2 ppd  . Alcohol use 12.6 oz/week    21 Shots of liquor per week     Comment: 2-3 a day  . Drug use: No  . Sexual activity: Yes    Partners: Female   Other Topics Concern  . Not on file   Social History Narrative  . No narrative on file   No Known Allergies Family History  Problem Relation Age of Onset  . Lung cancer Mother        smoker  . Leukemia Father        Acute myelocytic  . Diabetes Neg Hx   . Stroke Neg Hx   . Heart disease Neg Hx   .  Colon cancer Neg Hx   . Colon polyps Neg Hx   . Esophageal cancer Neg Hx   . Rectal cancer Neg Hx   . Stomach cancer Neg Hx     Past medical history, social, surgical and family history all reviewed in electronic medical record.  No pertanent information unless stated regarding to the chief complaint.   Review of Systems:Review of systems updated and as accurate as of 06/25/16  No headache, visual changes, nausea, vomiting, diarrhea, constipation, dizziness, skin rash, fevers, chills, night sweats, weight loss, swollen lymph nodes, chest pain, shortness of breath, mood changes.   Positive body aches, muscle aches, abdominal pain  Objective  There were no vitals taken for this visit. Systems examined below as of 06/25/16   General: No apparent distress alert and oriented x3 mood and affect normal,  dressed appropriately.  HEENT: Pupils equal, extraocular movements intact  Respiratory: Patient's speak in full sentences and does not appear short of breath  Cardiovascular: No lower extremity edema, non tender, no erythema  Skin: Warm dry intact with no signs of infection or rash on extremities or on axial skeleton.  Abdomen: Soft nontender Mild ascites Neuro: Cranial nerves II through XII are intact, n unable to find a dorsalis pedal pulses or posterior tibialis pulses well. Lymph: No lymphadenopathy of posterior or anterior cervical chain or axillae bilaterally.  Gait normal with good balance and coordination.  MSK:  Non tender with full range of motion and good stability and symmetric strength and tone of shoulders, elbows, wrist, hip, knee and ankles bilaterally.  Foot exam shows hemosiderin deposits with ashy appearance no pulse dp or ptt noted patient also has some peripheral neuropathy of the lower extremities, and ankles distally. Breakdown of transverse arch.    Impression and Recommendations:     This case required medical decision making of moderate complexity.      Note: This dictation was prepared with Dragon dictation along with smaller phrase technology. Any transcriptional errors that result from this process are unintentional.

## 2016-06-27 ENCOUNTER — Other Ambulatory Visit: Payer: Self-pay

## 2016-06-29 NOTE — Progress Notes (Signed)
Procedure Note   Patient was fitted for a : standard, cushioned, semi-rigid orthotic, Comfort model. The orthotic was heated and afterward the patient patient seated position and molded The patient was positioned in subtalar neutral position and 10 degrees of ankle dorsiflexion in a weight bearing stance. After completion of molding, patient did have orthotic management The blank was ground to a stable position for weight bearing. Size: 13 Base: Carbon fiber Additional Posting and Padding: right: 300/140x2 medial longitudinal arch, 200/30 lateral longitudinal arch; left: 300/120x1 medial longitudinal arch, 200/30 lateral longitudinal arch, 200/30 transverse arch. The patient ambulated these, and they were very comfortable.

## 2016-07-01 ENCOUNTER — Ambulatory Visit (INDEPENDENT_AMBULATORY_CARE_PROVIDER_SITE_OTHER): Payer: Medicare Other | Admitting: Family Medicine

## 2016-07-01 DIAGNOSIS — M722 Plantar fascial fibromatosis: Secondary | ICD-10-CM

## 2016-07-01 DIAGNOSIS — M216X9 Other acquired deformities of unspecified foot: Secondary | ICD-10-CM

## 2016-07-01 NOTE — Assessment & Plan Note (Signed)
Patient needed this medically necessary at this time. Was placed in the orthotics for the loss of the transverse arch and then chronic plantar fasciitis. Tolerated the procedure well. Patient will start wearing 2 hours daily at first and increase wear as patient can. We discussed icing regimen. Discussed continuing the home exercises. Follow-up again in 2-4 weeks. Do feel the underlying peripheral vascular disease is also likely contributed to some of the pain.

## 2016-07-08 ENCOUNTER — Ambulatory Visit (HOSPITAL_COMMUNITY)
Admission: RE | Admit: 2016-07-08 | Discharge: 2016-07-08 | Disposition: A | Discharge status: Home / Self Care | Payer: Medicare Other | Source: Ambulatory Visit | Attending: Cardiovascular Disease | Admitting: Cardiovascular Disease

## 2016-07-08 DIAGNOSIS — M79672 Pain in left foot: Secondary | ICD-10-CM | POA: Insufficient documentation

## 2016-07-08 DIAGNOSIS — M79671 Pain in right foot: Secondary | ICD-10-CM | POA: Insufficient documentation

## 2016-07-08 DIAGNOSIS — E1140 Type 2 diabetes mellitus with diabetic neuropathy, unspecified: Secondary | ICD-10-CM | POA: Diagnosis not present

## 2016-07-08 DIAGNOSIS — G909 Disorder of the autonomic nervous system, unspecified: Secondary | ICD-10-CM | POA: Diagnosis not present

## 2016-07-17 ENCOUNTER — Other Ambulatory Visit: Payer: Self-pay | Admitting: *Deleted

## 2016-07-17 DIAGNOSIS — I739 Peripheral vascular disease, unspecified: Secondary | ICD-10-CM

## 2016-07-21 ENCOUNTER — Other Ambulatory Visit: Payer: Self-pay

## 2016-07-21 DIAGNOSIS — M79671 Pain in right foot: Secondary | ICD-10-CM

## 2016-07-21 DIAGNOSIS — M79672 Pain in left foot: Principal | ICD-10-CM

## 2016-07-21 DIAGNOSIS — R6889 Other general symptoms and signs: Secondary | ICD-10-CM

## 2016-07-24 ENCOUNTER — Ambulatory Visit (INDEPENDENT_AMBULATORY_CARE_PROVIDER_SITE_OTHER): Payer: Medicare Other | Admitting: Family Medicine

## 2016-07-24 ENCOUNTER — Encounter: Payer: Self-pay | Admitting: Family Medicine

## 2016-07-24 ENCOUNTER — Telehealth: Payer: Self-pay

## 2016-07-24 VITALS — BP 132/72 | HR 98 | Ht 74.0 in | Wt 233.0 lb

## 2016-07-24 DIAGNOSIS — M216X9 Other acquired deformities of unspecified foot: Secondary | ICD-10-CM | POA: Diagnosis not present

## 2016-07-24 DIAGNOSIS — I739 Peripheral vascular disease, unspecified: Secondary | ICD-10-CM | POA: Diagnosis not present

## 2016-07-24 DIAGNOSIS — E1142 Type 2 diabetes mellitus with diabetic polyneuropathy: Secondary | ICD-10-CM | POA: Diagnosis not present

## 2016-07-24 NOTE — Assessment & Plan Note (Signed)
Discussed that this can be contributing to the vascular compromise and we'll need to continue to monitor closely. Lab Results  Component Value Date   HGBA1C 8.4 (H) 01/09/2016

## 2016-07-24 NOTE — Assessment & Plan Note (Signed)
Made some mild changes to the orthotics today. Discussed wear pattern. Discussed how to change about home. Follow-up again in 4 weeks

## 2016-07-24 NOTE — Patient Instructions (Addendum)
God to see yo  We made adjustments to the orthotics and I think it will help The blood flow is the concern.  I will ask fields if any way to move it up \ Write me when you need me

## 2016-07-24 NOTE — Progress Notes (Signed)
[facility] [address] [place], [place] [address] Phone: [phone] Subjective:   :    CC: Bilateral foot painFollow-up  VFI:[id number]  Patrick Kidd is a 71 y.o. male coming in with complaint of bilateral foot pain. Past medical history is significant for an thrombocytopenia, diabetes, alcoholic cirrhosis, peripheral neuropathy as well as plantar fasciitis.  Patient was recently seen by me and did have significant breakdown of the foot. There is concern for patient having peripheral vascular disease. Patient was sent for an ABI that was abnormal. Patient has been referred for further evaluation by cardiology or vascular. Awaiting appointment at this time. Patient was given orthotics. Has been wearing them and has noticed some improvement. Still though unfortunately continues to have cramping after walking 100-200 feet. Been as severe pain for a day afterwards. Patient is concerned because he will be the primary caregiver for his wife who is having knee replacement.     Past Medical History:  Diagnosis Date  . Acrophobia   . Alcoholic cirrhosis ([place]) 1/66/0630  . Arthritis    foot by big toe  . Cataract    removed both eyes  . Chronic cough    PMH of  . Diabetes mellitus without complication ([place])   . Diverticulosis 07-03-2010   Colonoscopy.   . Duodenal ulcer hemorrhage   . GERD (gastroesophageal reflux disease)   . Gout   . Granuloma annulare   . Hx of adenomatous colonic polyps multiple  . Hydrocele   . Nonspecific elevation of levels of transaminase or lactic acid dehydrogenase (LDH)   . Peripheral neuropathy   . Plantar fasciitis    PMH of  . Sleep apnea    no cpap   Past Surgical History:  Procedure Laterality Date  . CATARACT EXTRACTION, BILATERAL  12/2011    Dr Gershon Crane  . COLONOSCOPY    . COLONOSCOPY W/ POLYPECTOMY  07/03/2010   2 adenomas, diverticulosis on right. Dr [name]  . ESOPHAGOGASTRODUODENOSCOPY (EGD) WITH PROPOFOL  N/A 01/08/2016   Procedure: ESOPHAGOGASTRODUODENOSCOPY (EGD) WITH PROPOFOL;  Surgeon: [name], MD;  Location: [facility];  Service: Endoscopy;  Laterality: N/A;  . FLEXIBLE SIGMOIDOSCOPY  2000  . SIGMOIDOSCOPY    . WISDOM TOOTH EXTRACTION     Social History   Social History  . Marital status: Married    Spouse name: N/A  . Number of children: 2  . Years of education: N/A   Occupational History  . Commercial real estate    Social History Main Topics  . Smoking status: Former Smoker    Packs/day: 2.00    Years: 6.00    Types: Cigarettes    Quit date: 02/04/1971  . Smokeless tobacco: Never Used     Comment: smoked age [age], up to 2 ppd  . Alcohol use 12.6 oz/week    21 Shots of liquor per week     Comment: 2-3 a day  . Drug use: No  . Sexual activity: Yes    Partners: Female   Other Topics Concern  . None   Social History Narrative  . None   No Known Allergies Family History  Problem Relation Age of Onset  . Lung cancer Mother        smoker  . Leukemia Father        Acute myelocytic  . Diabetes Neg Hx   . Stroke Neg Hx   . Heart disease Neg Hx   . Colon cancer Neg Hx   .  Colon polyps Neg Hx   . Esophageal cancer Neg Hx   . Rectal cancer Neg Hx   . Stomach cancer Neg Hx     Past medical history, social, surgical and family history all reviewed in electronic medical record.  No pertanent information unless stated regarding to the chief complaint.   Review of Systems: No headache, visual changes, nausea, vomiting, diarrhea, constipation, dizziness, abdominal pain, skin rash, fevers, chills, night sweats, weight loss, swollen lymph nodes, chest pain, shortness of breath, mood changes.  Positive muscle achesAnd numbness of the lower extremities   Objective  Blood pressure 132/72, pulse 98, height 6\' 2"  (1.88 m), weight 233 lb (105.7 kg).   Systems examined below as of 07/24/16 General: NAD A&O x3 mood, affect normal  HEENT: Pupils equal, extraocular  movements intact no nystagmus Respiratory: not short of breath at rest or with speaking Cardiovascular: No lower extremity edema, non tender Skin: Warm dry intact with no signs of infection or rash on extremities or on axial skeleton. Abdomen: Soft nontender, no masses Neuro: Cranial nerves  intact, neurovascularly intact in all extremities with 2+ DTRs. Lymph: No lymphadenopathy appreciated today  Gait normal with good balance and coordination.  MSK: Non tender with full range of motion and good stability and symmetric strength and tone of shoulders, elbows, wrist,  knee hips and ankles bilaterally.  Arthritic changes of multiple joints Foot exam shows hemosiderin deposits with ashy appearance no pulse dp posterior tibialis tendinitis is noted at 1+ minimal change from previous exam   Impression and Recommendations:     This case required medical decision making of moderate complexity.      Note: This dictation was prepared with Dragon dictation along with smaller phrase technology. Any transcriptional errors that result from this process are unintentional.

## 2016-07-24 NOTE — Progress Notes (Signed)
re

## 2016-07-24 NOTE — Assessment & Plan Note (Signed)
Believe the patient's pain is secondary to more of the peripheral vascular disease. Patient does have some neuropathy likely secondary to the diabetes is well but I'm concerned and more doses what is causing most of the discomfort and pain. Patient is scheduled in the near future to see vascular cardiologist could potentially perform stenting. We'll discuss with him at great length. Follow-up again in 4 weeks

## 2016-07-24 NOTE — Telephone Encounter (Signed)
Left message for patient in regards to an appointment at [facility] for a pv stent. They are able to see him on 07/25/2016 at 4:00pm. Provided him with their phone number which is [phone] in order to reschedule.

## 2016-07-25 ENCOUNTER — Encounter: Payer: Self-pay | Admitting: Cardiovascular Disease

## 2016-07-25 ENCOUNTER — Ambulatory Visit (INDEPENDENT_AMBULATORY_CARE_PROVIDER_SITE_OTHER): Payer: Medicare Other | Admitting: Cardiovascular Disease

## 2016-07-25 VITALS — BP 142/77 | HR 98 | Ht 75.0 in

## 2016-07-25 DIAGNOSIS — I739 Peripheral vascular disease, unspecified: Secondary | ICD-10-CM | POA: Diagnosis not present

## 2016-07-25 NOTE — Progress Notes (Signed)
07/25/2016 Patrick Kidd   [birth year]  [id number]  Primary Physician [name], FNP Primary Cardiologist: Lorretta Harp MD Renae Gloss  HPI:  [name] is a 71 year old mildly overweight married Caucasian male father of 2, grandfather of 3 grandchildren referred by Dr. Tamala Julian for peripheral vascular evaluation because of numbness of both feet. He does have a history of recently diagnosed type 2 diabetes. He has no other cardiac vascular risk factors. He never had a heart attack or stroke and denies chest pain or shortness of breath. He's had numbness in his feet last 4-5 years but denies claudication.   Current Outpatient Prescriptions  Medication Sig Dispense Refill  . Blood Glucose Monitoring Suppl (FREESTYLE FREEDOM LITE) w/Device KIT Use to check blood sugars 2 times daily 1 each 1  . cilostazol (PLETAL) 50 MG tablet TAKE 1 TABLET(50 MG) BY MOUTH TWICE DAILY 180 tablet 1  . famotidine (PEPCID) 20 MG tablet Take 20 mg by mouth at bedtime as needed for heartburn or indigestion.    . gabapentin (NEURONTIN) 300 MG capsule Take one capsule four times a day as needed.     . gabapentin (NEURONTIN) 300 MG capsule TAKE 1 CAPSULE(300 MG) BY MOUTH FOUR TIMES DAILY 120 capsule 5  . glucose blood (FREESTYLE LITE) test strip Use as instructed 100 each 12  . metFORMIN (GLUCOPHAGE-XR) 500 MG 24 hr tablet TAKE 1 TABLET(500 MG) BY MOUTH DAILY WITH BREAKFAST 90 tablet 1  . Multiple Vitamin (MULTIVITAMIN WITH MINERALS) TABS tablet Take 1 tablet by mouth daily. 30 tablet 0  . pantoprazole (PROTONIX) 40 MG tablet Take 40 mg by mouth daily.    . pantoprazole (PROTONIX) 40 MG tablet TAKE 1 TABLET(40 MG) BY MOUTH TWICE DAILY 180 tablet 4  . Probiotic Product (ALIGN) 4 MG CAPS Take 4 mg by mouth daily.    . sildenafil (REVATIO) 20 MG tablet 2-5 qd prn 50 tablet 1  . simethicone (GAS-X) 80 MG chewable tablet Chew 1 tablet (80 mg total) by mouth every 6 (six) hours as needed for  flatulence. 30 tablet 0  . thiamine 100 MG tablet Take 1 tablet (100 mg total) by mouth daily. 30 tablet 0  . Vitamin D, Ergocalciferol, (DRISDOL) 50000 units CAPS capsule Take 1 capsule (50,000 Units total) by mouth every 7 (seven) days. 12 capsule 0   No current facility-administered medications for this visit.     No Known Allergies  Social History   Social History  . Marital status: Married    Spouse name: N/A  . Number of children: 2  . Years of education: N/A   Occupational History  . Commercial real estate    Social History Main Topics  . Smoking status: Former Smoker    Packs/day: 2.00    Years: 6.00    Types: Cigarettes    Quit date: 02/04/1971  . Smokeless tobacco: Never Used     Comment: smoked age [age], up to 2 ppd  . Alcohol use 12.6 oz/week    21 Shots of liquor per week     Comment: 2-3 a day  . Drug use: No  . Sexual activity: Yes    Partners: Female   Other Topics Concern  . Not on file   Social History Narrative  . No narrative on file     Review of Systems: General: negative for chills, fever, night sweats or weight changes.  Cardiovascular: negative for chest pain, dyspnea on exertion, edema, orthopnea,  palpitations, paroxysmal nocturnal dyspnea or shortness of breath Dermatological: negative for rash Respiratory: negative for cough or wheezing Urologic: negative for hematuria Abdominal: negative for nausea, vomiting, diarrhea, bright red blood per rectum, melena, or hematemesis Neurologic: negative for visual changes, syncope, or dizziness All other systems reviewed and are otherwise negative except as noted above.    Blood pressure (!) 142/77, pulse 98, height 6' 3"  (1.905 m).  General appearance: alert and no distress Neck: no adenopathy, no carotid bruit, no JVD, supple, symmetrical, trachea midline and thyroid not enlarged, symmetric, no tenderness/mass/nodules Lungs: clear to auscultation bilaterally Heart: regular rate and rhythm,  S1, S2 normal, no murmur, click, rub or gallop Extremities: extremities normal, atraumatic, no cyanosis or edema and 2+ pedal pulses bilaterally  EKG Sinus rhythm at 98 without ST or T-wave changes. I personally reviewed this EKG.  ASSESSMENT AND PLAN:   [facility] Mr. Ruddick was referred by Dr. [place] Crane for evaluation of PAD. He is really diagnosed type II diabetic. He has never had a heart attack or stroke. Denies chest pain or shortness of breath. He was recently prescribed Pletal. He describes numbness in both feet. On exam he is got 2+ dorsalis pedis and posterior tibial pulses. I do not think that his symptoms are vascular nature although I am really get lower extremity arterial Doppler studies and will see him back when necessary.      Lorretta Harp MD FACP,FACC,FAHA, [facility] 07/25/2016 5:08 PM

## 2016-07-25 NOTE — Assessment & Plan Note (Signed)
Patrick Kidd was referred by Dr. [place] Crane for evaluation of PAD. He is really diagnosed type II diabetic. He has never had a heart attack or stroke. Denies chest pain or shortness of breath. He was recently prescribed Pletal. He describes numbness in both feet. On exam he is got 2+ dorsalis pedis and posterior tibial pulses. I do not think that his symptoms are vascular nature although I am really get lower extremity arterial Doppler studies and will see him back when necessary.

## 2016-07-25 NOTE — Patient Instructions (Signed)
Medication Instructions: Your physician recommends that you continue on your current medications as directed. Please refer to the Current Medication list given to you today.   Testing/Procedures: Your physician has requested that you have a lower extremity arterial duplex. During this test, ultrasound is used to evaluate arterial blood flow in the legs. Allow one hour for this exam. There are no restrictions or special instructions.  Your physician has requested that you have an ankle brachial index (ABI). During this test an ultrasound and blood pressure cuff are used to evaluate the arteries that supply the arms and legs with blood. Allow thirty minutes for this exam. There are no restrictions or special instructions.  Follow-Up: Your physician recommends that you schedule a follow-up appointment as needed with Dr. Berry.     

## 2016-07-28 ENCOUNTER — Telehealth: Payer: Self-pay

## 2016-07-28 ENCOUNTER — Telehealth: Payer: Self-pay | Admitting: Cardiovascular Disease

## 2016-07-28 NOTE — Telephone Encounter (Signed)
Patient stopped by to get an adjustment on his orthotics and he had mentioned that he saw Dr. Alvester Chou. Based on Dr. Naida Sleight assessment, patient wasn't sure what the next step in his care would be so Dr. Tamala Julian reviewed patient's chart and saw that 2 ABI's were ordered. Dr. Tamala Julian gave verbal recommendation to call patient to let him know that he should call Dr. Naida Sleight office and find out when these procedures would be done. Called patient and he verbalized understanding that he would need to call Dr. Naida Sleight office.

## 2016-08-13 ENCOUNTER — Ambulatory Visit: Payer: Medicare Other | Admitting: Internal Medicine

## 2016-08-14 NOTE — Telephone Encounter (Signed)
c 

## 2016-08-15 ENCOUNTER — Encounter: Payer: Self-pay | Admitting: Neurology

## 2016-08-15 ENCOUNTER — Ambulatory Visit (INDEPENDENT_AMBULATORY_CARE_PROVIDER_SITE_OTHER): Payer: Medicare Other | Admitting: Neurology

## 2016-08-15 VITALS — BP 144/76 | HR 97 | Ht 75.0 in | Wt 233.6 lb

## 2016-08-15 DIAGNOSIS — E1142 Type 2 diabetes mellitus with diabetic polyneuropathy: Secondary | ICD-10-CM

## 2016-08-15 DIAGNOSIS — R030 Elevated blood-pressure reading, without diagnosis of hypertension: Secondary | ICD-10-CM | POA: Diagnosis not present

## 2016-08-15 NOTE — Patient Instructions (Signed)
1.  Try increasing gabapentin as tolerated. 2.  Continue working on diet 3.  Follow up in one year or as needed.

## 2016-08-15 NOTE — Progress Notes (Signed)
NEUROLOGY FOLLOW UP OFFICE NOTE  Patrick Kidd [id number]  HISTORY OF PRESENT ILLNESS: Patrick Kidd is a 71 y.o. right-handed male who follows up for peripheral neuropathy.     UPDATE: Gabapentin was increased to 659m four times daily last year, but he is now on 3028min AM and 60061mt bedtime.    Last year, he was diagnosed with type 2 diabetes.  Hgb A1c from 01/09/16 was 8.4.  He is on metformin and he has stopped drinking alcohol.  However, he doesn't watch his diet and eats sweets daily.  He has ice cream everyday, including 2 milkshakes a week.  He notes increased pain on the bottom of his feet over the past 2 weeks.  He has upcoming vascular studies.   HISTORY: Onset of symptoms began in 2010.  He describes initially experiencing numbness on the bottom of both feet, in which recently it has spread to the ankles. It is also associated with an uncomfortable tingling sensation. It is not painful but it is uncomfortable and usually bothers him at night.  He doesn't really notice it if he is active.  He is concerned about his balance.  The first time, he was getting out of a boat and felt that his right leg was not strong enough to hold his weight and he fell to his knees. The second time, he tripped over a chair and fell down to his knees.  He felt that his leg just gave out when it paired his weight. He is particularly concerned, because he does enjoy riding on a boat and dancing.  More recently, he stood up from a chair at a game and felt shaky and unsteady.  He has not been exercising as much due to fear of imbalance.  He denies any pain radiating down the legs.  He has pain in his right big toe after an 8 pound frozen duck breast fell on it.  He has tingling in the finger tips.   Labs from 08/04/12 revealed ESR 29, ANA negative, Lyme negative.  SPEP revealed possible faint restricted band in the gamma region, IFE revealed polyclonal gammopathy involving IgA and IgM.  Prior  labs last month revealed Hgb A1c 5.7, TSH 2.42, RPR non-reactive, serum glucose 131.  CBC and CMP otherwise unremarkable.  B12 from 5/12 was 1293.  Fasting glucose was 143 and 2 hour glucose tolerance test was 341.   PAST MEDICAL HISTORY: Past Medical History:  Diagnosis Date  . Acrophobia   . [place] ([place]31/[place]/1093 Arthritis    foot by big toe  . Cataract    removed both eyes  . Chronic cough    PMH of  . [place] . Diverticulosis 07-03-2010   Colonoscopy.   . Duodenal ulcer hemorrhage   . GERD (gastroesophageal reflux disease)   . Gout   . Granuloma annulare   . Hx of adenomatous colonic polyps multiple  . Hydrocele   . Nonspecific elevation of levels of transaminase or lactic acid dehydrogenase (LDH)   . Peripheral neuropathy   . Plantar fasciitis    PMH of  . Sleep apnea    no cpap    MEDICATIONS: Current Outpatient Prescriptions on File Prior to Visit  Medication Sig Dispense Refill  . Blood Glucose Monitoring Suppl (FREESTYLE FREEDOM LITE) w/Device KIT Use to check blood sugars 2 times daily 1 each 1  . cilostazol (PLETAL) 50 MG tablet TAKE 1 TABLET(50 MG)  BY MOUTH TWICE DAILY 180 tablet 1  . gabapentin (NEURONTIN) 300 MG capsule Take one capsule four times a day as needed.     Marland Kitchen glucose blood (FREESTYLE LITE) test strip Use as instructed 100 each 12  . metFORMIN (GLUCOPHAGE-XR) 500 MG 24 hr tablet TAKE 1 TABLET(500 MG) BY MOUTH DAILY WITH BREAKFAST 90 tablet 1  . Multiple Vitamin (MULTIVITAMIN WITH MINERALS) TABS tablet Take 1 tablet by mouth daily. 30 tablet 0  . pantoprazole (PROTONIX) 40 MG tablet Take 40 mg by mouth daily.    . Probiotic Product (ALIGN) 4 MG CAPS Take 4 mg by mouth daily.    . sildenafil (REVATIO) 20 MG tablet 2-5 qd prn 50 tablet 1  . simethicone (GAS-X) 80 MG chewable tablet Chew 1 tablet (80 mg total) by mouth every 6 (six) hours as needed for flatulence. 30 tablet 0  . Vitamin D,  Ergocalciferol, (DRISDOL) 50000 units CAPS capsule Take 1 capsule (50,000 Units total) by mouth every 7 (seven) days. 12 capsule 0  . famotidine (PEPCID) 20 MG tablet Take 20 mg by mouth at bedtime as needed for heartburn or indigestion.     No current facility-administered medications on file prior to visit.     ALLERGIES: No Known Allergies  FAMILY HISTORY: Family History  Problem Relation Age of Onset  . Lung cancer Mother        smoker  . Leukemia Father        Acute myelocytic  . Diabetes Neg Hx   . Stroke Neg Hx   . Heart disease Neg Hx   . Colon cancer Neg Hx   . Colon polyps Neg Hx   . Esophageal cancer Neg Hx   . Rectal cancer Neg Hx   . Stomach cancer Neg Hx     SOCIAL HISTORY: Social History   Social History  . Marital status: Married    Spouse name: N/A  . Number of children: 2  . Years of education: N/A   Occupational History  . Commercial real estate    Social History Main Topics  . Smoking status: Former Smoker    Packs/day: 2.00    Years: 6.00    Types: Cigarettes    Quit date: 02/04/1971  . Smokeless tobacco: Never Used     Comment: smoked age [age], up to 2 ppd  . Alcohol use 12.6 oz/week    21 Shots of liquor per week     Comment: 2-3 a day  . Drug use: No  . Sexual activity: Yes    Partners: Female   Other Topics Concern  . Not on file   Social History Narrative  . No narrative on file    REVIEW OF SYSTEMS: Constitutional: No fevers, chills, or sweats, no generalized fatigue, change in appetite Eyes: No visual changes, double vision, eye pain Ear, nose and throat: No hearing loss, ear pain, nasal congestion, sore throat Cardiovascular: No chest pain, palpitations Respiratory:  No shortness of breath at rest or with exertion, wheezes GastrointestinaI: No nausea, vomiting, diarrhea, abdominal pain, fecal incontinence Genitourinary:  No dysuria, urinary retention or frequency Musculoskeletal:  No neck pain, back pain Integumentary:  No rash, pruritus, skin lesions Neurological: as above Psychiatric: No depression, insomnia, anxiety Endocrine: No palpitations, fatigue, diaphoresis, mood swings, change in appetite, change in weight, increased thirst Hematologic/Lymphatic:  No purpura, petechiae. Allergic/Immunologic: no itchy/runny eyes, nasal congestion, recent allergic reactions, rashes  PHYSICAL EXAM: Vitals:   08/15/16 1441  BP: (!) 144/76  Pulse: 97   General: No acute distress.  Patient appears well-groomed.   Head:  Normocephalic/atraumatic Eyes:  Fundi examined but not visualized Neck: supple, no paraspinal tenderness, full range of motion Heart:  Regular rate and rhythm Lungs:  Clear to auscultation bilaterally Back: No paraspinal tenderness Neurological Exam: alert and oriented to person, place, and time. Attention span and concentration intact, recent and remote memory intact, fund of knowledge intact.  Speech fluent and not dysarthric, language intact.  CN II-XII intact. Bulk and tone normal, muscle strength 5/5 throughout.  Sensation to temperature and vibration reduced in feet.  Deep tendon reflexes absent throughout, toes downgoing.  Finger to nose and heel to shin testing intact.  Gait normal, Romberg negative.  IMPRESSION: Diabetic polyneuropathy Elevated blood pressure  PLAN: 1.  Try to titrate gabapentin as tolerated/needed. 2.  Advised to modify diet for glycemic control 3.  Follow up blood pressure with PCP 4.  Follow up in one year or as needed.  25 minutes spent face to face with patient, over 50% spent discussing management.  Metta Clines, DO  CC:  Mauricio Po, FNP

## 2016-08-22 ENCOUNTER — Telehealth: Payer: Self-pay | Admitting: Family Medicine

## 2016-08-22 ENCOUNTER — Ambulatory Visit (HOSPITAL_COMMUNITY)
Admission: RE | Admit: 2016-08-22 | Discharge: 2016-08-22 | Disposition: A | Discharge status: Home / Self Care | Payer: Medicare Other | Source: Ambulatory Visit | Attending: Cardiovascular Disease | Admitting: Cardiovascular Disease

## 2016-08-22 ENCOUNTER — Inpatient Hospital Stay (HOSPITAL_COMMUNITY): Admission: RE | Admit: 2016-08-22 | Payer: Medicare Other | Source: Ambulatory Visit

## 2016-08-22 DIAGNOSIS — I739 Peripheral vascular disease, unspecified: Secondary | ICD-10-CM

## 2016-08-22 NOTE — Telephone Encounter (Signed)
Pt called stating his Orthotics are coming apart and he would like a call back on Monday

## 2016-08-27 ENCOUNTER — Telehealth: Payer: Self-pay | Admitting: Family Medicine

## 2016-08-27 NOTE — Telephone Encounter (Signed)
error 

## 2016-08-27 NOTE — Telephone Encounter (Signed)
Pt called to let you know that his orthotics have broken down.

## 2016-08-28 ENCOUNTER — Ambulatory Visit (INDEPENDENT_AMBULATORY_CARE_PROVIDER_SITE_OTHER): Payer: Medicare Other | Admitting: Sports Medicine

## 2016-08-28 ENCOUNTER — Encounter: Payer: Self-pay | Admitting: Sports Medicine

## 2016-08-28 VITALS — BP 140/82 | HR 91 | Ht 75.0 in | Wt 235.2 lb

## 2016-08-28 DIAGNOSIS — M216X9 Other acquired deformities of unspecified foot: Secondary | ICD-10-CM

## 2016-08-28 DIAGNOSIS — I739 Peripheral vascular disease, unspecified: Secondary | ICD-10-CM

## 2016-08-28 DIAGNOSIS — E1142 Type 2 diabetes mellitus with diabetic polyneuropathy: Secondary | ICD-10-CM

## 2016-08-28 DIAGNOSIS — M722 Plantar fascial fibromatosis: Secondary | ICD-10-CM

## 2016-08-28 NOTE — Telephone Encounter (Signed)
Patient coming in this afternoon for orthotic adjustment at 2:00pm.

## 2016-08-28 NOTE — Patient Instructions (Signed)
Go to [facility] medical supply on [address] and look for the VIVE wear compression socks.  You need 15-20 mmHg for these.

## 2016-08-28 NOTE — Telephone Encounter (Signed)
Spoke with Theadora Rama, Dr. Nicolasa Ducking assistant for Dr. Paulla Fore to make [facility] orthotics for patient. Patient is going to see them this afternoon instead of Dr. Tamala Julian. Theadora Rama spoke with patient.

## 2016-08-28 NOTE — Progress Notes (Signed)
OFFICE VISIT NOTE Patrick Kidd, [place] [facility] [place]  Patrick Kidd - 71 y.o. male MRN [id number]  Date of birth: 11/16/1945  Visit Date: 08/28/2016  PCP: Patrick Circle, FNP   Referred by: Patrick Circle, FNP  Patrick Kidd, Patrick Kidd acting as scribe for Patrick Kidd.  SUBJECTIVE:   Chief Complaint  Patient presents with  . New Patient (Initial Visit)    loss of transverse plantar arch   HPI: As below and per problem based documentation when appropriate.  Pt presents today to discuss custom orthotics. He has been diagnosed by Patrick Kidd as having loss of transverse plantar arch. He has tried Igly orthotics in the past but has run into issues with the balls falling off because [place] and [place] them not to stick.     Review of Systems  Constitutional: Negative for chills and fever.  Respiratory: Negative for shortness of breath and wheezing.   Cardiovascular: Negative for chest pain and palpitations.  Musculoskeletal: Negative for falls.  Neurological: Positive for tingling. Negative for dizziness and headaches.  Endo/Heme/Allergies: Bruises/bleeds easily.    Otherwise per HPI.  HISTORY & PERTINENT PRIOR DATA:  No specialty comments available. He reports that he quit smoking about 45 years ago. His smoking use included Cigarettes. He has a 12.00 pack-year smoking history. He has never used smokeless tobacco.   Recent Labs  01/08/16 0138 01/09/16 0326  HGBA1C 8.4* 8.4*   Medications & Allergies reviewed per EMR Patient Active Problem List   Diagnosis Date Noted  . Peripheral vascular disease ([place]) 06/25/2016  . Loss of transverse plantar arch 06/25/2016  . Word finding difficulty 01/14/2016  . Alcoholic cirrhosis ([place]) 85/46/2703  . Liver cyst 10/04/2015  . Type 2 diabetes mellitus ([place]) 08/22/2015  . Rash and nonspecific skin eruption 08/22/2015  . Abdominal  distention 08/22/2015  . Elevated PSA 11/18/2014  . Thrombocytopenia ([place]) 03/19/2014  . Hypersomnolence 03/19/2014  . Dyspnea 03/07/2014  . Plantar fasciitis of right foot 02/17/2014  . Obesity (BMI 30-39.9) 07/19/2013  . Peripheral neuropathy 01/11/2013  . Polyclonal gammopathy 01/11/2013  . Impaired glucose tolerance 01/11/2013  . Edema 01/11/2013  . Hx of adenomatous colonic polyps 07/16/2012  . Chronic cough 05/31/2010  . GERD 10/04/2009  . History of gout 08/03/2006  . ACROPHOBIA 07/10/2006   Past Medical History:  Diagnosis Date  . Acrophobia   . Alcoholic cirrhosis ([place]) 5/00/9381  . Arthritis    foot by big toe  . Cataract    removed both eyes  . Chronic cough    PMH of  . Diabetes mellitus without complication ([place])   . Diverticulosis 07-03-2010   Colonoscopy.   . Duodenal ulcer hemorrhage   . GERD (gastroesophageal reflux disease)   . Gout   . Granuloma annulare   . Hx of adenomatous colonic polyps multiple  . Hydrocele   . Nonspecific elevation of levels of transaminase or lactic acid dehydrogenase (LDH)   . Peripheral neuropathy   . Plantar fasciitis    PMH of  . Sleep apnea    no cpap   Family History  Problem Relation Age of Onset  . Lung cancer Mother        smoker  . Leukemia Father        Acute myelocytic  . Diabetes Neg Hx   . Stroke Neg Hx   . Heart disease  Neg Hx   . Colon cancer Neg Hx   . Colon polyps Neg Hx   . Esophageal cancer Neg Hx   . Rectal cancer Neg Hx   . Stomach cancer Neg Hx    Past Surgical History:  Procedure Laterality Date  . CATARACT EXTRACTION, BILATERAL  12/2011    Patrick Kidd  . COLONOSCOPY    . COLONOSCOPY W/ POLYPECTOMY  07/03/2010   2 adenomas, diverticulosis on right. Patrick Kidd  . ESOPHAGOGASTRODUODENOSCOPY (EGD) WITH PROPOFOL N/A 01/08/2016   Procedure: ESOPHAGOGASTRODUODENOSCOPY (EGD) WITH PROPOFOL;  Surgeon: Patrick Artist, MD;  Location: [facility];  Service: Endoscopy;  Laterality: N/A;  . FLEXIBLE  SIGMOIDOSCOPY  2000  . SIGMOIDOSCOPY    . WISDOM TOOTH EXTRACTION     Social History   Occupational History  . Commercial real estate    Social History Main Topics  . Smoking status: Former Smoker    Packs/day: 2.00    Years: 6.00    Types: Cigarettes    Quit date: 02/04/1971  . Smokeless tobacco: Never Used     Comment: smoked age [age], up to 2 ppd  . Alcohol use 12.6 oz/week    21 Shots of liquor per week     Comment: 2-3 a day  . Drug use: No  . Sexual activity: Yes    Partners: Female    OBJECTIVE:  VS:  HT:6\' 3"  (190.5 cm)   WT:235 lb 3.2 oz (106.7 kg)  BMI:29.5    BP:140/82  HR:91bpm  TEMP: ( )  RESP:95 % EXAM: Findings:  Bilateral lower extremities with chronic brawny venous stasis changes without ulceration.  DP and PT pulses are 3/4 bilaterally.  He has markedly high cavus foot on the right with collapse of the transverse arch and significant splay toe throughout both feet.  He has poor sensation to light touch but is able to discriminate medial from lateral.  Does have protective sensation. Good capillary refill.     No results found. ASSESSMENT & PLAN:     ICD-10-CM   1. Type 2 diabetes mellitus with diabetic polyneuropathy, without long-term current use of insulin (HCC) E11.42   2. Plantar fasciitis of right foot M72.2   3. Loss of transverse plantar arch, unspecified laterality M21.6X9   4. Peripheral vascular disease (HCC) I73.9   ================================================================= Loss of transverse plantar arch Patient has significant lower extremity changes complicated by his underlying diabetes with neuropathy.  Custom cushioned insoles were fabricated for him today based on time-based visit.  He has had previous insoles by Patrick Kidd and has had issues with the postings migrating.  Images were obtained today that showed the migration pattern.  He does not have any ulcerations and does have protective sensation.  We did discuss the use of  VIVE Wear Compression Sharol Given Sock) to help with his chronic venous stasis insufficiency and possibly his peripheral neuropathy although this is an off label use.  Ultimately we did adjust his IGLI orthotics as well as fabricated a new pair of FASTEC custom cushion orthotics as below.  We will plan to have him follow-up on an as-needed basis and discuss that appropriate cushioning and protective sensation as well as foot checks are important.  >50% of this 50 minute visit spent in direct patient counseling and/or coordination of care.  Discussion was focused on education regarding the in discussing the pathoetiology and anticipated clinical course of the above condition.  We additionally spent excessive time discussing the merits and evaluating his  foot and ankle structure and support regarding the comfort and fit of his orthotics.  ++++++++++++++++++++++++++++++++++++++++++++ PROCEDURE: CUSTOM ORTHOTIC FABRICATION Patient's underlying musculoskeletal conditions are directly related to poor biomechanics and will benefit from a functional custom orthotic.  There are no significant foot deformities that complicate the use of a custom orthotic.  The patient was fitted for a standard, cushioned, semi-rigid orthotic. The orthotic was heated & placed on the orthotic stand. The patient was positioned in subtalar neutral position and 10 of ankle dorsiflexion and weight bearing stance some heated orthotic blank. After completion of the molding a stable paste was applied to the orthotic blank. The orthotic was ground to a stable position for weightbearing. The patient ambulated in these and reported they were comfortable without pressure spots.              BLANK:  Size 13 - Standard Cushioned                 BASE:  Blue EVA      POSTINGS:  Additional Right scaphoid pad added  ++++++++++++++++++++++++++++++++++++++++++++ PROCEDURE: ORTHOTIC MANAGEMENT Patient's underlying musculoskeletal conditions are directly  related to poor biomechanics and will benefit from a functional custom orthotic.  There are no significant foot deformities that complicate the use of a custom orthotic.  Patient has IGLI Active orthotics.  Approximately 15 minutes was spent in direct fabrication, modification, fitting, reevaluating and training the patient on appropriate use and care.  Patient noted the orthotics provided adequate comfort and there is no reported pressure points or areas of discomfort.  Medial postings were removed and a small scaphoid pad was replaced after cleaning the wound that was present.          Patient Instructions  Go to [facility] medical supply on [address] and look for the VIVE wear compression socks.  You need 15-20 mmHg for these. ================================================================= Follow-up: Return if symptoms worsen or fail to improve.   Patrick Kidd/[facility] served as Education administrator during this visit. History, Physical, and Plan performed by medical provider. Documentation and orders reviewed and attested to.      Teresa Coombs, [facility] Physician

## 2016-08-28 NOTE — Assessment & Plan Note (Signed)
Patient has significant lower extremity changes complicated by his underlying diabetes with neuropathy.  Custom cushioned insoles were fabricated for him today based on time-based visit.  He has had previous insoles by Dr. Tamala Julian and has had issues with the postings migrating.  Images were obtained today that showed the migration pattern.  He does not have any ulcerations and does have protective sensation.  We did discuss the use of VIVE Wear Compression Sharol Given Sock) to help with his chronic venous stasis insufficiency and possibly his peripheral neuropathy although this is an off label use.  Ultimately we did adjust his IGLI orthotics as well as fabricated a new pair of FASTEC custom cushion orthotics as below.  We will plan to have him follow-up on an as-needed basis and discuss that appropriate cushioning and protective sensation as well as foot checks are important.  >50% of this 50 minute visit spent in direct patient counseling and/or coordination of care.  Discussion was focused on education regarding the in discussing the pathoetiology and anticipated clinical course of the above condition.  We additionally spent excessive time discussing the merits and evaluating his foot and ankle structure and support regarding the comfort and fit of his orthotics.  ++++++++++++++++++++++++++++++++++++++++++++ PROCEDURE: CUSTOM ORTHOTIC FABRICATION Patient's underlying musculoskeletal conditions are directly related to poor biomechanics and will benefit from a functional custom orthotic.  There are no significant foot deformities that complicate the use of a custom orthotic.  The patient was fitted for a standard, cushioned, semi-rigid orthotic. The orthotic was heated & placed on the orthotic stand. The patient was positioned in subtalar neutral position and 10 of ankle dorsiflexion and weight bearing stance some heated orthotic blank. After completion of the molding a stable paste was applied to the orthotic  blank. The orthotic was ground to a stable position for weightbearing. The patient ambulated in these and reported they were comfortable without pressure spots.              BLANK:  Size 13 - Standard Cushioned                 BASE:  Blue EVA      POSTINGS:  Additional Right scaphoid pad added  ++++++++++++++++++++++++++++++++++++++++++++ PROCEDURE: ORTHOTIC MANAGEMENT Patient's underlying musculoskeletal conditions are directly related to poor biomechanics and will benefit from a functional custom orthotic.  There are no significant foot deformities that complicate the use of a custom orthotic.  Patient has IGLI Active orthotics.  Approximately 15 minutes was spent in direct fabrication, modification, fitting, reevaluating and training the patient on appropriate use and care.  Patient noted the orthotics provided adequate comfort and there is no reported pressure points or areas of discomfort.  Medial postings were removed and a small scaphoid pad was replaced after cleaning the wound that was present.

## 2016-09-03 ENCOUNTER — Other Ambulatory Visit: Payer: Self-pay | Admitting: Family

## 2016-09-03 DIAGNOSIS — E1142 Type 2 diabetes mellitus with diabetic polyneuropathy: Secondary | ICD-10-CM

## 2016-09-03 NOTE — Telephone Encounter (Signed)
Pt also called regarding this refill.

## 2016-09-03 NOTE — Telephone Encounter (Signed)
Per chart pt is overdue for f/u appt only approved 30 day until appt...Patrick Kidd

## 2016-09-05 DIAGNOSIS — L821 Other seborrheic keratosis: Secondary | ICD-10-CM | POA: Diagnosis not present

## 2016-09-05 DIAGNOSIS — L920 Granuloma annulare: Secondary | ICD-10-CM | POA: Diagnosis not present

## 2016-09-09 ENCOUNTER — Encounter: Payer: Self-pay | Admitting: Vascular Surgery

## 2016-09-18 ENCOUNTER — Ambulatory Visit (HOSPITAL_COMMUNITY): Payer: Medicare Other

## 2016-09-18 ENCOUNTER — Encounter: Payer: Medicare Other | Admitting: Vascular Surgery

## 2016-09-26 ENCOUNTER — Telehealth: Payer: Self-pay | Admitting: Family

## 2016-09-26 NOTE — Telephone Encounter (Signed)
Called pt back he states he has not been compliance w/his medications. Didn't take metformin yesterday, and after eating breakfast  And havign about 2 cups coffee w/cream and sugar he decided to check his BS and it was 449, but since then it has came down to 350. Did inform pt that reading is still high. Review chart and pt has seen Marya Amsler in over a year. Inform he is due for annual appt w/labs. Made appt for next Thurs @ 2:30...Johny Chess

## 2016-09-26 NOTE — Telephone Encounter (Signed)
Pt called in and said that sugar is a little high today and he is requesting a call back from nurse .  That is the only info he would give me

## 2016-10-02 ENCOUNTER — Ambulatory Visit (INDEPENDENT_AMBULATORY_CARE_PROVIDER_SITE_OTHER): Payer: Medicare Other | Admitting: Family

## 2016-10-02 ENCOUNTER — Encounter: Payer: Self-pay | Admitting: Family

## 2016-10-02 VITALS — BP 124/68 | HR 75 | Temp 98.5°F | Resp 16 | Ht 75.0 in | Wt 236.0 lb

## 2016-10-02 DIAGNOSIS — I739 Peripheral vascular disease, unspecified: Secondary | ICD-10-CM | POA: Diagnosis not present

## 2016-10-02 DIAGNOSIS — E1142 Type 2 diabetes mellitus with diabetic polyneuropathy: Secondary | ICD-10-CM

## 2016-10-02 DIAGNOSIS — Z0000 Encounter for general adult medical examination without abnormal findings: Secondary | ICD-10-CM | POA: Diagnosis not present

## 2016-10-02 DIAGNOSIS — Z125 Encounter for screening for malignant neoplasm of prostate: Secondary | ICD-10-CM | POA: Diagnosis not present

## 2016-10-02 MED ORDER — METFORMIN HCL ER 500 MG PO TB24: 500.0000 mg | ORAL_TABLET | Freq: Every day | ORAL | 0 refills | Status: DC

## 2016-10-02 NOTE — Assessment & Plan Note (Signed)
Reviewed and updated patient's medical, surgical, family and social history. Medications and allergies were also reviewed. Basic screenings for depression, activities of daily living, hearing, cognition and safety were performed. Provider list was updated and health plan was provided to the patient.   Declines influenza. Tetanus and pneumococcal vaccinations are up-to-date per recommendations. Diabetic foot exam completed today. Encouraged to complete dental exam independently. Hepatitis C screening is been completed. Obtain PSA for prostate cancer screening. Colon cancer screening is up-to-date per recommendations.  Overall well exam with risk factors for cardiovascular disease including type 2 diabetes and peripheral vascular disease. He was congratulated on remaining sober secondary to liver cirrhosis. Does have follow-up with gastroenterology. Chronic conditions appear adequately managed through medication regimens with no adverse side effects, although he does report blood sugars have been elevated recently. Continue other healthy lifestyle behaviors and choices. Follow-up prevention exam in 1 year. Follow-up office visit pending blood work and for chronic conditions.

## 2016-10-02 NOTE — Progress Notes (Signed)
Subjective:    Patient ID: [name], male    DOB: [birth year], 71 y.o.   MRN: [id number]  Chief Complaint  Patient presents with  . CPE    not fasting    HPI:  Patrick Kidd is a 71 y.o. male who presents today for a Medicare Annual Wellness/Physical exam.    1) Health Maintenance -   Diet - Averaging 2-3 meals per day consisting of a regular diet; Caffeine intake of 2-3 cups daily   Exercise - [employer]; 2x per week with a mixuture of cardio and resistance training.   2) Preventative Exams / Immunizations:  Dental -- Due for exam   Vision -- Up to date    Health Maintenance  Topic Date Due  . OPHTHALMOLOGY EXAM  10[birth year]20/1954  . URINE MICROALBUMIN  07/19/2014  . HEMOGLOBIN A1C  07/09/2016  . FOOT EXAM  08/21/2016  . INFLUENZA VACCINE  11/17/2016 (Originally 09/03/2016)  . COLONOSCOPY  10/18/2018  . TETANUS/TDAP  06/04/2019  . Hepatitis C Screening  Completed  . PNA vac Low Risk Adult  Completed     Immunization History  Administered Date(s) Administered  . Hep A / Hep B 11/02/2015  . Influenza,inj,Quad PF,6+ Mos 01/10/2016  . Pneumococcal Conjugate-13 09/04/2015  . Pneumococcal Polysaccharide-23 06/05/2011  . Tdap 06/03/2009    RISK FACTORS  Tobacco History  Smoking Status  . Former Smoker  . Packs/day: 2.00  . Years: 6.00  . Types: Cigarettes  . Quit date: 02/04/1971  Smokeless Tobacco  . Never Used    Comment: smoked age [age], up to 2 ppd     Cardiac risk factors: advanced age (older than 82 for men, 82 for women), diabetes mellitus, male gender and obesity (BMI >= 30 kg/m2).  Depression Screen  Depression screen [facility] 2/9 10/02/2016  Decreased Interest 0  Down, Depressed, Hopeless 0  PHQ - 2 Score 0     Activities of Daily Living In your present state of health, do you have any difficulty performing the following activities?:  Driving? No Managing money?  No Feeding yourself? No Getting from bed to chair? No Climbing a flight of  stairs? No Preparing food and eating?: No Bathing or showering? No Getting dressed: No Getting to the toilet? No Using the toilet: No Moving around from place to place: No In the past year have you fallen or had a near fall?:No   Home Safety Has smoke detector and wears seat belts. No firearms. No excess sun exposure. Are there smokers in your home (other than you)?  No Do you feel safe at home?  Yes  Hearing Difficulties: No Do you often ask people to speak up or repeat themselves? No Do you experience ringing or noises in your ears? No  Do you have difficulty understanding soft or whispered voices? No    Cognitive Testing  Alert? Yes   Normal Appearance? Yes  Oriented to person? Yes  Place? Yes   Time? Yes  Recall of three objects?  Yes  Can perform simple calculations? Yes  Displays appropriate judgment? Yes  Can read the correct time from a watch face? Yes  Do you feel that you have a problem with memory? No  Do you often misplace items? No   Advanced Directives have been discussed with the patient? Yes   Current Physicians/Providers and Suppliers  1. Patrick Piedra, FNP - Internal Medicine 2. Patrick Clines, MD - Neurology 3. Patrick Rusk, MD - Gastroenterology 4. Patrick Coombs,  DO - Sports Medicine   Indicate any recent Medical Services you may have received from other than [facility] providers in the past year (date may be approximate).  All answers were reviewed with the patient and necessary referrals were made:  Patrick Kidd, Patrick Kidd   10/02/2016    No Known Allergies   Outpatient Medications Prior to Visit  Medication Sig Dispense Refill  . Blood Glucose Monitoring Suppl (FREESTYLE FREEDOM LITE) w/Device KIT Use to check blood sugars 2 times daily 1 each 1  . cilostazol (PLETAL) 50 MG tablet TAKE 1 TABLET(50 MG) BY MOUTH TWICE DAILY 180 tablet 1  . Ferrous Sulfate (IRON) 325 (65 Fe) MG TABS Take by mouth.    . gabapentin (NEURONTIN) 300 MG capsule Take one capsule  four times a day as needed.     Marland Kitchen glucose blood (FREESTYLE LITE) test strip Use as instructed 100 each 12  . Multiple Vitamin (MULTIVITAMIN WITH MINERALS) TABS tablet Take 1 tablet by mouth daily. 30 tablet 0  . pantoprazole (PROTONIX) 40 MG tablet Take 40 mg by mouth daily.    . sildenafil (REVATIO) 20 MG tablet 2-5 qd prn 50 tablet 1  . Vitamin D, Ergocalciferol, (DRISDOL) 50000 units CAPS capsule Take 1 capsule (50,000 Units total) by mouth every 7 (seven) days. 12 capsule 0  . famotidine (PEPCID) 20 MG tablet Take 20 mg by mouth at bedtime as needed for heartburn or indigestion.    . metFORMIN (GLUCOPHAGE-XR) 500 MG 24 hr tablet Take 1 tablet (500 mg total) by mouth daily with breakfast. Overdue for f/u appt must see provider for future refills 30 tablet 0  . Probiotic Product (ALIGN) 4 MG CAPS Take 4 mg by mouth daily.    . simethicone (GAS-X) 80 MG chewable tablet Chew 1 tablet (80 mg total) by mouth every 6 (six) hours as needed for flatulence. 30 tablet 0   No facility-administered medications prior to visit.      Past Medical History:  Diagnosis Date  . Acrophobia   . Alcoholic cirrhosis (Patrick Kidd) 9/83/3825  . Arthritis    foot by big toe  . Cataract    removed both eyes  . Chronic cough    PMH of  . Diabetes mellitus without complication (Patrick Kidd)   . Diverticulosis 07-03-2010   Colonoscopy.   . Duodenal ulcer hemorrhage   . GERD (gastroesophageal reflux disease)   . Gout   . Granuloma annulare   . Hx of adenomatous colonic polyps multiple  . Hydrocele   . Nonspecific elevation of levels of transaminase or lactic acid dehydrogenase (LDH)   . Peripheral neuropathy   . Plantar fasciitis    PMH of  . Sleep apnea    no cpap     Past Surgical History:  Procedure Laterality Date  . CATARACT EXTRACTION, BILATERAL  12/2011    Dr Patrick Kidd  . COLONOSCOPY    . COLONOSCOPY W/ POLYPECTOMY  07/03/2010   2 adenomas, diverticulosis on right. Dr Patrick Kidd  . ESOPHAGOGASTRODUODENOSCOPY  (EGD) WITH PROPOFOL N/A 01/08/2016   Procedure: ESOPHAGOGASTRODUODENOSCOPY (EGD) WITH PROPOFOL;  Surgeon: Patrick Artist, MD;  Location: [facility];  Service: Endoscopy;  Laterality: N/A;  . FLEXIBLE SIGMOIDOSCOPY  2000  . SIGMOIDOSCOPY    . WISDOM TOOTH EXTRACTION       Family History  Problem Relation Age of Onset  . Lung cancer Mother        smoker  . Leukemia Father        Acute myelocytic  .  Diabetes Neg Hx   . Stroke Neg Hx   . Heart disease Neg Hx   . Colon cancer Neg Hx   . Colon polyps Neg Hx   . Esophageal cancer Neg Hx   . Rectal cancer Neg Hx   . Stomach cancer Neg Hx      Social History   Social History  . Marital status: Married    Spouse name: N/A  . Number of children: 2  . Years of education: N/A   Occupational History  . Commercial real estate    Social History Main Topics  . Smoking status: Former Smoker    Packs/day: 2.00    Years: 6.00    Types: Cigarettes    Quit date: 02/04/1971  . Smokeless tobacco: Never Used     Comment: smoked age [age], up to 2 ppd  . Alcohol use No  . Drug use: No  . Sexual activity: Yes    Partners: Female   Other Topics Concern  . Not on file   Social History Narrative   Fun/Hobby: Play golf, grandchildren, [employer]     Review of Systems  Constitutional: Denies fever, chills, fatigue, or significant weight gain/loss. HENT: Head: Denies headache or neck pain Ears: Denies changes in hearing, ringing in ears, earache, drainage Nose: Denies discharge, stuffiness, itching, nosebleed, sinus pain Throat: Denies sore throat, hoarseness, dry mouth, sores, thrush Eyes: Denies loss/changes in vision, pain, redness, blurry/double vision, flashing lights Cardiovascular: Denies chest pain/discomfort, tightness, palpitations, shortness of breath with activity, difficulty lying down, swelling, sudden awakening with shortness of breath Respiratory: Denies shortness of breath, cough, sputum production,  wheezing Gastrointestinal: Denies dysphasia, heartburn, change in appetite, nausea, change in bowel habits, rectal bleeding, constipation, diarrhea, yellow skin or eyes Genitourinary: Denies frequency, urgency, burning/pain, blood in urine, incontinence, change in urinary strength. Musculoskeletal: Denies muscle/joint pain, stiffness, back pain, redness or swelling of joints, trauma Skin: Denies rashes, lumps, itching, dryness, color changes, or hair/nail changes Neurological: Denies dizziness, fainting, seizures, weakness, numbness, tingling, tremor Psychiatric - Denies nervousness, stress, depression or memory loss Endocrine: Denies heat or cold intolerance, sweating, frequent urination, excessive thirst, changes in appetite Hematologic: Denies ease of bruising or bleeding    Objective:     BP 124/68 (BP Location: Left Arm, Patient Position: Sitting, Cuff Size: Large)   Pulse 75   Temp 98.5 F (36.9 C) (Oral)   Resp 16   Ht 6' 3"  (1.905 m)   Wt 236 lb (107 kg)   SpO2 97%   BMI 29.50 kg/m  Nursing note and vital signs reviewed.  Physical Exam  Constitutional: He is oriented to person, place, and time. He appears well-developed and well-nourished. No distress.  Cardiovascular: Normal rate, regular rhythm, normal heart sounds and intact distal pulses.   Pulmonary/Chest: Effort normal and breath sounds normal.  Neurological: He is alert and oriented to person, place, and time.  Skin: Skin is warm and dry.  Psychiatric: He has a normal mood and affect. His behavior is normal. Judgment and thought content normal.       Assessment & Plan:   During the course of the visit the patient was educated and counseled about appropriate screening and preventive services including:    Pneumococcal vaccine   Influenza vaccine  Td vaccine  Prostate cancer screening  Colorectal cancer screening  Diabetes screening  Glaucoma screening  Nutrition counseling   Diet review for  nutrition referral? Yes ____  Not Indicated _X___   Patient Instructions (the  written plan) was given to the patient.  Medicare Attestation I have personally reviewed: The patient's medical and social history Their use of alcohol, tobacco or illicit drugs Their current medications and supplements The patient's functional ability including ADLs,fall risks, home safety risks, cognitive, and hearing and visual impairment Diet and physical activities Evidence for depression or mood disorders  The patient's weight, height, BMI,  have been recorded in the chart.  I have made referrals, counseling, and provided education to the patient based on review of the above and I have provided the patient with a written personalized care plan for preventive services.     Problem List Items Addressed This Visit      Cardiovascular and Mediastinum   Peripheral vascular disease ([place])    Continues to experience neuropathy to his bilateral lower extremities with risk factors for peripheral vascular disease including type 2 diabetes. Continue to adjust risk factors to reduce risk of progression of peripheral vascular disease. Continue to monitor.        Endocrine   Type 2 diabetes mellitus (HCC)    Obtain hemoglobin A1c and urine microalbumin. Diabetic foot exam completed today. Not currently maintained on Ace/ARB or statin for CAD risk reduction. Per patient diabetic eye exam is up-to-date per recommendations. Pneumovax is up-to-date. Encouraged to monitor blood sugars at home and follow low carbohydrate intake. Follow-up and changes pending A1c results.      Relevant Medications   metFORMIN (GLUCOPHAGE-XR) 500 MG 24 hr tablet   Other Relevant Orders   Hemoglobin A1c   Urine Microalbumin w/creat. ratio   CBC   Comprehensive metabolic panel   Lipid panel     Other   Medicare annual wellness visit, subsequent - Primary    Reviewed and updated patient's medical, surgical, family and social history.  Medications and allergies were also reviewed. Basic screenings for depression, activities of daily living, hearing, cognition and safety were performed. Provider list was updated and health plan was provided to the patient.   Declines influenza. Tetanus and pneumococcal vaccinations are up-to-date per recommendations. Diabetic foot exam completed today. Encouraged to complete dental exam independently. Hepatitis C screening is been completed. Obtain PSA for prostate cancer screening. Colon cancer screening is up-to-date per recommendations.  Overall well exam with risk factors for cardiovascular disease including type 2 diabetes and peripheral vascular disease. He was congratulated on remaining sober secondary to liver cirrhosis. Does have follow-up with gastroenterology. Chronic conditions appear adequately managed through medication regimens with no adverse side effects, although he does report blood sugars have been elevated recently. Continue other healthy lifestyle behaviors and choices. Follow-up prevention exam in 1 year. Follow-up office visit pending blood work and for chronic conditions.       Other Visit Diagnoses    Screening for prostate cancer       Relevant Orders   PSA, Medicare       I have discontinued Mr. Russomanno's simethicone, famotidine, and ALIGN. I have also changed his metFORMIN. Additionally, I am having him maintain his sildenafil, gabapentin, multivitamin with minerals, FREESTYLE FREEDOM LITE, pantoprazole, glucose blood, Vitamin D (Ergocalciferol), cilostazol, and Iron.   Meds ordered this encounter  Medications  . metFORMIN (GLUCOPHAGE-XR) 500 MG 24 hr tablet    Sig: Take 1 tablet (500 mg total) by mouth daily with breakfast.    Dispense:  30 tablet    Refill:  0     Follow-up: Return in about 3 months (around 01/02/2017), or if symptoms worsen or fail to  improve.   Patrick Po, FNP

## 2016-10-02 NOTE — Assessment & Plan Note (Signed)
Continues to experience neuropathy to his bilateral lower extremities with risk factors for peripheral vascular disease including type 2 diabetes. Continue to adjust risk factors to reduce risk of progression of peripheral vascular disease. Continue to monitor.

## 2016-10-02 NOTE — Assessment & Plan Note (Signed)
Obtain hemoglobin A1c and urine microalbumin. Diabetic foot exam completed today. Not currently maintained on Ace/ARB or statin for CAD risk reduction. Per patient diabetic eye exam is up-to-date per recommendations. Pneumovax is up-to-date. Encouraged to monitor blood sugars at home and follow low carbohydrate intake. Follow-up and changes pending A1c results.

## 2016-10-02 NOTE — Patient Instructions (Addendum)
Thank you for choosing [facility].  SUMMARY AND INSTRUCTIONS:  Please continue to take your medications as prescribed.  Congratulations on quitting alcohol!  We will check your blood work for your blood sugars, cholesterol and prostate function.  Health Maintenance  Topic Date Due  . OPHTHALMOLOGY EXAM  10[birth year]20/1954  . URINE MICROALBUMIN  07/19/2014  . HEMOGLOBIN A1C  07/09/2016  . FOOT EXAM  08/21/2016  . INFLUENZA VACCINE  11/17/2016 (Originally 09/03/2016)  . COLONOSCOPY  10/18/2018  . TETANUS/TDAP  06/04/2019  . Hepatitis C Screening  Completed  . PNA vac Low Risk Adult  Completed    Medication:  Your prescription(s) have been submitted to your pharmacy or been printed and provided for you. Please take as directed and contact our office if you believe you are having problem(s) with the medication(s) or have any questions.  Labs:  Please stop by the lab on the lower level of the building for your blood work. Your results will be released to [facility] (or called to you) after review, usually within 72 hours after test completion. If any changes need to be made, you will be notified at that same time.  1.) The lab is open from 7:30am to 5:30 pm Monday-Friday 2.) No appointment is necessary 3.) Fasting (if needed) is 6-8 hours after food and drink; black coffee and water are okay   Follow up:  If your symptoms worsen or fail to improve, please contact our office for further instruction, or in case of emergency go directly to the emergency room at the closest medical facility.    Health Maintenance, Male A healthy lifestyle and preventive care is important for your health and wellness. Ask your health care provider about what schedule of regular examinations is right for you. What should I know about weight and diet? Eat a Healthy Diet  Eat plenty of vegetables, fruits, whole grains, low-fat dairy products, and lean protein.  Do not eat a lot of foods high in solid  fats, added sugars, or salt.  Maintain a Healthy Weight Regular exercise can help you achieve or maintain a healthy weight. You should:  Do at least 150 minutes of exercise each week. The exercise should increase your heart rate and make you sweat (moderate-intensity exercise).  Do strength-training exercises at least twice a week.  Watch Your Levels of Cholesterol and Blood Lipids  Have your blood tested for lipids and cholesterol every 5 years starting at 72 years of age. If you are at high risk for heart disease, you should start having your blood tested when you are 72 years old. You may need to have your cholesterol levels checked more often if: ? Your lipid or cholesterol levels are high. ? You are older than 72 years of age. ? You are at high risk for heart disease.  What should I know about cancer screening? Many types of cancers can be detected early and may often be prevented. Lung Cancer  You should be screened every year for lung cancer if: ? You are a current smoker who has smoked for at least 30 years. ? You are a former smoker who has quit within the past 15 years.  Talk to your health care provider about your screening options, when you should start screening, and how often you should be screened.  Colorectal Cancer  Routine colorectal cancer screening usually begins at 72 years of age and should be repeated every 5-10 years until you are 72 years old. You may need  to be screened more often if early forms of precancerous polyps or small growths are found. Your health care provider may recommend screening at an earlier age if you have risk factors for colon cancer.  Your health care provider may recommend using home test kits to check for hidden blood in the stool.  A small camera at the end of a tube can be used to examine your colon (sigmoidoscopy or colonoscopy). This checks for the earliest forms of colorectal cancer.  Prostate and Testicular Cancer  Depending  on your age and overall health, your health care provider may do certain tests to screen for prostate and testicular cancer.  Talk to your health care provider about any symptoms or concerns you have about testicular or prostate cancer.  Skin Cancer  Check your skin from head to toe regularly.  Tell your health care provider about any new moles or changes in moles, especially if: ? There is a change in a mole's size, shape, or color. ? You have a mole that is larger than a pencil eraser.  Always use sunscreen. Apply sunscreen liberally and repeat throughout the day.  Protect yourself by wearing long sleeves, pants, a wide-brimmed hat, and sunglasses when outside.  What should I know about heart disease, diabetes, and high blood pressure?  If you are 28-66 years of age, have your blood pressure checked every 3-5 years. If you are 12 years of age or older, have your blood pressure checked every year. You should have your blood pressure measured twice-once when you are at a hospital or clinic, and once when you are not at a hospital or clinic. Record the average of the two measurements. To check your blood pressure when you are not at a hospital or clinic, you can use: ? An automated blood pressure machine at a pharmacy. ? A home blood pressure monitor.  Talk to your health care provider about your target blood pressure.  If you are between 35-44 years old, ask your health care provider if you should take aspirin to prevent heart disease.  Have regular diabetes screenings by checking your fasting blood sugar level. ? If you are at a normal weight and have a low risk for diabetes, have this test once every three years after the age of 44. ? If you are overweight and have a high risk for diabetes, consider being tested at a younger age or more often.  A one-time screening for abdominal aortic aneurysm (AAA) by ultrasound is recommended for men aged 56-75 years who are current or former  smokers. What should I know about preventing infection? Hepatitis B If you have a higher risk for hepatitis B, you should be screened for this virus. Talk with your health care provider to find out if you are at risk for hepatitis B infection. Hepatitis C Blood testing is recommended for:  Everyone born from [birth year] through 1966.  Anyone with known risk factors for hepatitis C.  Sexually Transmitted Diseases (STDs)  You should be screened each year for STDs including gonorrhea and chlamydia if: ? You are sexually active and are younger than 72 years of age. ? You are older than 72 years of age and your health care provider tells you that you are at risk for this type of infection. ? Your sexual activity has changed since you were last screened and you are at an increased risk for chlamydia or gonorrhea. Ask your health care provider if you are at risk.  Talk with  your health care provider about whether you are at high risk of being infected with HIV. Your health care provider may recommend a prescription medicine to help prevent HIV infection.  What else can I do?  Schedule regular health, dental, and eye exams.  Stay current with your vaccines (immunizations).  Do not use any tobacco products, such as cigarettes, chewing tobacco, and e-cigarettes. If you need help quitting, ask your health care provider.  Limit alcohol intake to no more than 2 drinks per day. One drink equals 12 ounces of beer, 5 ounces of wine, or 1 ounces of hard liquor.  Do not use street drugs.  Do not share needles.  Ask your health care provider for help if you need support or information about quitting drugs.  Tell your health care provider if you often feel depressed.  Tell your health care provider if you have ever been abused or do not feel safe at home. This information is not intended to replace advice given to you by your health care provider. Make sure you discuss any questions you have with your  health care provider. Document Released: 07/19/2007 Document Revised: 09/19/2015 Document Reviewed: 10/24/2014 Elsevier Interactive Patient Education  [employer].

## 2016-10-07 ENCOUNTER — Ambulatory Visit: Payer: Medicare Other | Admitting: Internal Medicine

## 2016-10-10 ENCOUNTER — Other Ambulatory Visit: Payer: Self-pay | Admitting: Family

## 2016-10-10 ENCOUNTER — Other Ambulatory Visit (INDEPENDENT_AMBULATORY_CARE_PROVIDER_SITE_OTHER): Payer: Medicare Other

## 2016-10-10 DIAGNOSIS — E1142 Type 2 diabetes mellitus with diabetic polyneuropathy: Secondary | ICD-10-CM | POA: Diagnosis not present

## 2016-10-10 DIAGNOSIS — Z125 Encounter for screening for malignant neoplasm of prostate: Secondary | ICD-10-CM | POA: Diagnosis not present

## 2016-10-10 LAB — COMPREHENSIVE METABOLIC PANEL
ALK PHOS: 89 U/L (ref 39–117)
ALT: 10 U/L (ref 0–53)
AST: 12 U/L (ref 0–37)
Albumin: 4 g/dL (ref 3.5–5.2)
BILIRUBIN TOTAL: 1.4 mg/dL — AB (ref 0.2–1.2)
BUN: 13 mg/dL (ref 6–23)
CO2: 31 meq/L (ref 19–32)
CREATININE: 1.04 mg/dL (ref 0.40–1.50)
Calcium: 9.4 mg/dL (ref 8.4–10.5)
Chloride: 98 mEq/L (ref 96–112)
GFR: 74.62 mL/min (ref >60.00)
Glucose, Bld: 368 mg/dL — ABNORMAL HIGH (ref 70–99)
Potassium: 4.3 mEq/L (ref 3.5–5.1)
SODIUM: 137 meq/L (ref 135–145)
TOTAL PROTEIN: 6.5 g/dL (ref 6.0–8.3)

## 2016-10-10 LAB — LIPID PANEL
CHOLESTEROL: 171 mg/dL (ref 0–200)
HDL: 54.3 mg/dL (ref >39.00)
LDL Cholesterol: 99 mg/dL (ref 0–99)
NonHDL: 116.87
TRIGLYCERIDES: 89 mg/dL (ref 0.0–149.0)
Total CHOL/HDL Ratio: 3
VLDL: 17.8 mg/dL (ref 0.0–40.0)

## 2016-10-10 LAB — CBC
HEMATOCRIT: 47.1 % (ref 39.0–52.0)
Hemoglobin: 16.6 g/dL (ref 13.0–17.0)
MCHC: 35.2 g/dL (ref 30.0–36.0)
MCV: 89.8 fl (ref 78.0–100.0)
Platelets: 92 10*3/uL — ABNORMAL LOW (ref 150.0–400.0)
RBC: 5.25 Mil/uL (ref 4.22–5.81)
RDW: 13.1 % (ref 11.5–15.5)
WBC: 5.7 10*3/uL (ref 4.0–10.5)

## 2016-10-10 LAB — PSA, MEDICARE: PSA: 7.65 ng/ml — ABNORMAL HIGH (ref 0.10–4.00)

## 2016-10-10 LAB — HEMOGLOBIN A1C: HEMOGLOBIN A1C: 12.2 % — AB (ref 4.6–6.5)

## 2016-10-10 LAB — MICROALBUMIN / CREATININE URINE RATIO
Creatinine,U: 89.6 mg/dL
MICROALB UR: 1.3 mg/dL (ref 0.0–1.9)
MICROALB/CREAT RATIO: 1.5 mg/g (ref 0.0–30.0)

## 2016-10-10 MED ORDER — EMPAGLIFLOZIN 10 MG PO TABS: 10.0000 mg | ORAL_TABLET | Freq: Every day | ORAL | 2 refills | Status: DC

## 2016-10-10 NOTE — Progress Notes (Unsigned)
ard 

## 2016-10-21 ENCOUNTER — Telehealth: Payer: Self-pay

## 2016-10-21 MED ORDER — DAPAGLIFLOZIN PROPANEDIOL 5 MG PO TABS: 5.0000 mg | ORAL_TABLET | Freq: Every day | ORAL | 2 refills | Status: DC

## 2016-10-21 NOTE — Telephone Encounter (Signed)
[facility] on [address] sent over a drug change request for Jardiance. The alternatives would be [place] and [place]. Please advise.

## 2016-10-21 NOTE — Telephone Encounter (Signed)
Pt is aware.  

## 2016-10-21 NOTE — Telephone Encounter (Signed)
Patient is currently a Patrick Kidd pt and is looking for a doctor to transfer to within the office. Would you be willing to take new pt?

## 2016-10-21 NOTE — Telephone Encounter (Signed)
Farxiga prescription sent to pharmacy.

## 2016-10-22 NOTE — Telephone Encounter (Signed)
Yes, I will see him.

## 2016-10-22 NOTE — Telephone Encounter (Signed)
New pt appointment scheduled with [facility]. Pt is aware.

## 2016-10-28 ENCOUNTER — Ambulatory Visit (INDEPENDENT_AMBULATORY_CARE_PROVIDER_SITE_OTHER)
Admission: RE | Admit: 2016-10-28 | Discharge: 2016-10-28 | Disposition: A | Discharge status: Home / Self Care | Payer: Medicare Other | Source: Ambulatory Visit | Attending: Internal Medicine | Admitting: Internal Medicine

## 2016-10-28 ENCOUNTER — Ambulatory Visit (INDEPENDENT_AMBULATORY_CARE_PROVIDER_SITE_OTHER): Payer: Medicare Other | Admitting: Internal Medicine

## 2016-10-28 VITALS — BP 120/72 | HR 73 | Temp 99.2°F | Ht 75.0 in | Wt 234.0 lb

## 2016-10-28 DIAGNOSIS — S6992XA Unspecified injury of left wrist, hand and finger(s), initial encounter: Secondary | ICD-10-CM | POA: Diagnosis not present

## 2016-10-28 DIAGNOSIS — S67193A Crushing injury of left middle finger, initial encounter: Secondary | ICD-10-CM

## 2016-10-28 DIAGNOSIS — Z23 Encounter for immunization: Secondary | ICD-10-CM

## 2016-10-28 DIAGNOSIS — S6010XA Contusion of unspecified finger with damage to nail, initial encounter: Secondary | ICD-10-CM

## 2016-10-28 NOTE — Patient Instructions (Signed)
Subungual Hematoma A subungual hematoma means there is blood that gathers under a fingernail or toenail. A blue or dark blue color is seen under the nail. You may have pain or throbbing in the finger or toe. Follow these instructions at home:  If directed, put ice on the area. ? Put ice in a plastic bag. ? Place a towel between your skin and the bag. ? Leave the ice on for 20 minutes, 2-3 times a day.  Raise (elevate) the injured finger or toe above the level of your heart while you are sitting or lying down. Doing this will help with pain and swelling.  If part of your nail falls off, gently trim the rest of the nail.  Follow instructions from your doctor about how to take care of your injury. Make sure you: ? Wash your hands with soap and water before you change your bandage (dressing). If you cannot use soap and water, use hand sanitizer. ? Change your bandage as told by your doctor. ? Leave stitches (sutures) in place. You may have these if your doctor fixed a cut under the nail. The stitches may need to stay in place for 2 weeks or longer.  Take over-the-counter and prescription medicines only as told by your doctor.  Keep all follow-up visits as told by your doctor. This is important. Contact a doctor if:  Your medicine does not help with your pain.  There is redness, swelling, or pain around your nail. Get help right away if:  You have fluid, blood, or pus coming from your nail.  You have a fever. This information is not intended to replace advice given to you by your health care provider. Make sure you discuss any questions you have with your health care provider. Document Released: 04/14/2011 Document Revised: 09/19/2015 Document Reviewed: 06/07/2014 Elsevier Interactive Patient Education  [employer].

## 2016-10-28 NOTE — Progress Notes (Signed)
Subjective:  Patient ID: Patrick Kidd, male    DOB: September 03, 1945  Age: 71 y.o. MRN: [id number]  CC: Hand Pain   HPI Patrick Kidd presents for Concerns about his left middle finger. He got the tip of it crushed in a garage door about 3 days ago. He complains of bruising and swelling at the tip of the finger. There has been no numbness.  Outpatient Medications Prior to Visit  Medication Sig Dispense Refill  . Blood Glucose Monitoring Suppl (FREESTYLE FREEDOM LITE) w/Device KIT Use to check blood sugars 2 times daily 1 each 1  . cilostazol (PLETAL) 50 MG tablet TAKE 1 TABLET(50 MG) BY MOUTH TWICE DAILY 180 tablet 1  . dapagliflozin propanediol (FARXIGA) 5 MG TABS tablet Take 5 mg by mouth daily. 30 tablet 2  . Ferrous Sulfate (IRON) 325 (65 Fe) MG TABS Take by mouth.    . gabapentin (NEURONTIN) 300 MG capsule Take one capsule four times a day as needed.     Marland Kitchen glucose blood (FREESTYLE LITE) test strip Use as instructed 100 each 12  . metFORMIN (GLUCOPHAGE-XR) 500 MG 24 hr tablet Take 1 tablet (500 mg total) by mouth daily with breakfast. 30 tablet 0  . pantoprazole (PROTONIX) 40 MG tablet Take 40 mg by mouth daily.    . sildenafil (REVATIO) 20 MG tablet 2-5 qd prn 50 tablet 1  . Vitamin D, Ergocalciferol, (DRISDOL) 50000 units CAPS capsule Take 1 capsule (50,000 Units total) by mouth every 7 (seven) days. 12 capsule 0  . Multiple Vitamin (MULTIVITAMIN WITH MINERALS) TABS tablet Take 1 tablet by mouth daily. 30 tablet 0   No facility-administered medications prior to visit.     ROS Review of Systems  All other systems reviewed and are negative.   Objective:  BP 120/72 (BP Location: Left Arm, Patient Position: Sitting, Cuff Size: Normal)   Pulse 73   Temp 99.2 F (37.3 C) (Oral)   Ht _0  (1.905 m)   Wt 234 lb (106.1 kg)   SpO2 98%   BMI 29.25 kg/m   BP Readings from Last 3 Encounters:  10/28/16 120/72  10/02/16 124/68  08/28/16 140/82    Wt Readings from Last 3  Encounters:  10/28/16 234 lb (106.1 kg)  10/02/16 236 lb (107 kg)  08/28/16 235 lb 3.2 oz (106.7 kg)    Physical Exam  Musculoskeletal:       Hands: The left middle fingernail was cleaned with Betadine and then a cautery was used to make a hole in the fingernail and a copious amount of blood was released. The patient immediately felt relief from the pain. Sensation  & capillary refill continue to be intact. A dressing was applied.    Lab Results  Component Value Date   WBC 5.7 10/10/2016   HGB 16.6 10/10/2016   HCT 47.1 10/10/2016   PLT 92.0 (L) 10/10/2016   GLUCOSE 368 (H) 10/10/2016   CHOL 171 10/10/2016   TRIG 89.0 10/10/2016   HDL 54.30 10/10/2016   LDLCALC 99 10/10/2016   ALT 10 10/10/2016   AST 12 10/10/2016   NA 137 10/10/2016   K 4.3 10/10/2016   CL 98 10/10/2016   CREATININE 1.04 10/10/2016   BUN 13 10/10/2016   CO2 31 10/10/2016   TSH 1.525 01/08/2016   PSA 7.65 (H) 10/10/2016   INR 1.2 (H) 01/31/2016   HGBA1C 12.2 (H) 10/10/2016   MICROALBUR 1.3 10/10/2016   Dg Finger Middle Left  Result Date:  10/28/2016 CLINICAL DATA:  Slammed middle finger in garage door 4 days ago. EXAM: LEFT MIDDLE FINGER 2+V COMPARISON:  None. FINDINGS: Soft tissue swelling and possible laceration over the tip of the left index finger. Soft tissue gas noted near the nail bed. No fracture, subluxation or dislocation. Joint spaces are maintained. IMPRESSION: No acute bony abnormality. Electronically Signed   By: Rolm Baptise M.D.   On: 10/28/2016 14:56    No results found.  Assessment & Plan:   Madix was seen today for hand pain.  Diagnoses and all orders for this visit:  Crushing injury of left middle finger, initial encounter- plain films are negative for fracture. Symptoms improved significantly after the release of the subungual hematoma. -     DG Finger Middle Left; Future  Subungual hematoma of finger of left hand, initial encounter- this has been successfully treated with  boring a hole in the fingernail. He will keep the area protected and elevated.  Need for influenza vaccination -     Flu vaccine HIGH DOSE PF (Fluzone High dose)  Need for Tdap vaccination -     Tdap vaccine greater than or equal to 7yo IM   I have discontinued Mr. Mattie's multivitamin with minerals. I am also having him maintain his sildenafil, gabapentin, FREESTYLE FREEDOM LITE, pantoprazole, glucose blood, Vitamin D (Ergocalciferol), cilostazol, Iron, metFORMIN, and dapagliflozin propanediol.  No orders of the defined types were placed in this encounter.    Follow-up: Return in about 2 weeks (around 11/11/2016).  Scarlette Calico, MD

## 2016-10-29 ENCOUNTER — Encounter: Payer: Self-pay | Admitting: Internal Medicine

## 2016-11-18 ENCOUNTER — Other Ambulatory Visit: Payer: Self-pay | Admitting: Family

## 2016-11-18 DIAGNOSIS — E1142 Type 2 diabetes mellitus with diabetic polyneuropathy: Secondary | ICD-10-CM

## 2016-11-26 ENCOUNTER — Ambulatory Visit: Payer: Medicare Other | Admitting: Internal Medicine

## 2016-11-26 ENCOUNTER — Ambulatory Visit (INDEPENDENT_AMBULATORY_CARE_PROVIDER_SITE_OTHER): Payer: Medicare Other | Admitting: Internal Medicine

## 2016-11-26 ENCOUNTER — Encounter: Payer: Self-pay | Admitting: Internal Medicine

## 2016-11-26 VITALS — BP 138/74 | HR 78 | Temp 98.5°F | Resp 16 | Ht 75.0 in | Wt 236.0 lb

## 2016-11-26 DIAGNOSIS — Z794 Long term (current) use of insulin: Secondary | ICD-10-CM

## 2016-11-26 DIAGNOSIS — E118 Type 2 diabetes mellitus with unspecified complications: Secondary | ICD-10-CM

## 2016-11-26 DIAGNOSIS — E669 Obesity, unspecified: Secondary | ICD-10-CM

## 2016-11-26 LAB — POCT GLYCOSYLATED HEMOGLOBIN (HGB A1C): Hemoglobin A1C: 10.7

## 2016-11-26 MED ORDER — GLUCOSE BLOOD VI STRP: ORAL_STRIP | 12 refills | Status: DC

## 2016-11-26 MED ORDER — SITAGLIPTIN PHOS-METFORMIN HCL 50-1000 MG PO TABS: 1.0000 | ORAL_TABLET | Freq: Two times a day (BID) | ORAL | 1 refills | Status: DC

## 2016-11-26 MED ORDER — INSULIN DEGLUDEC 200 UNIT/ML ~~LOC~~ SOPN: 40.0000 [IU] | PEN_INJECTOR | Freq: Every day | SUBCUTANEOUS | 1 refills | Status: DC

## 2016-11-26 NOTE — Patient Instructions (Signed)

## 2016-11-26 NOTE — Progress Notes (Signed)
Subjective:  Patient ID: Patrick Kidd, male    DOB: 12-07-1945  Age: 71 y.o. MRN: [id number]  CC: Diabetes   HPI Patrick Kidd presents for f/up on DM- he complains of polydipsia and polyuria and says in general his blood sugars are improving over the last few weeks.  His fasting blood sugars are 150-180 and postprandials are around 185-200.  Outpatient Medications Prior to Visit  Medication Sig Dispense Refill  . Blood Glucose Monitoring Suppl (FREESTYLE FREEDOM LITE) w/Device KIT Use to check blood sugars 2 times daily 1 each 1  . cilostazol (PLETAL) 50 MG tablet TAKE 1 TABLET(50 MG) BY MOUTH TWICE DAILY 180 tablet 1  . dapagliflozin propanediol (FARXIGA) 5 MG TABS tablet Take 5 mg by mouth daily. 30 tablet 2  . Ferrous Sulfate (IRON) 325 (65 Fe) MG TABS Take by mouth.    . gabapentin (NEURONTIN) 300 MG capsule Take one capsule four times a day as needed.     . pantoprazole (PROTONIX) 40 MG tablet Take 40 mg by mouth daily.    . sildenafil (REVATIO) 20 MG tablet 2-5 qd prn 50 tablet 1  . Vitamin D, Ergocalciferol, (DRISDOL) 50000 units CAPS capsule Take 1 capsule (50,000 Units total) by mouth every 7 (seven) days. 12 capsule 0  . glucose blood (FREESTYLE LITE) test strip Use as instructed 100 each 12  . metFORMIN (GLUCOPHAGE-XR) 500 MG 24 hr tablet Take 1 tablet (500 mg total) by mouth daily with breakfast. Follow-up appt due in Nov must see new provider for future refills 30 tablet 0   No facility-administered medications prior to visit.     ROS Review of Systems  Constitutional: Negative.  Negative for appetite change, chills, fatigue and fever.  HENT: Negative.   Eyes: Negative.  Negative for visual disturbance.  Respiratory: Negative for cough, chest tightness, shortness of breath and wheezing.   Cardiovascular: Negative for chest pain, palpitations and leg swelling.  Gastrointestinal: Negative for abdominal pain, blood in stool, constipation, diarrhea, nausea and  vomiting.  Endocrine: Positive for polydipsia and polyuria. Negative for polyphagia.  Genitourinary: Negative.  Negative for decreased urine volume, difficulty urinating, dysuria and urgency.  Musculoskeletal: Negative.  Negative for back pain and myalgias.  Skin: Negative.  Negative for color change and rash.  Allergic/Immunologic: Negative.   Neurological: Negative.  Negative for dizziness, weakness and light-headedness.  Hematological: Negative for adenopathy. Does not bruise/bleed easily.  Psychiatric/Behavioral: Negative.     Objective:  BP 138/74 (BP Location: Left Arm, Patient Position: Sitting, Cuff Size: Normal)   Pulse 78   Temp 98.5 F (36.9 C) (Oral)   Resp 16   Ht 6' 3"  (1.905 m)   Wt 236 lb (107 kg)   SpO2 98%   BMI 29.50 kg/m   BP Readings from Last 3 Encounters:  11/26/16 138/74  10/28/16 120/72  10/02/16 124/68    Wt Readings from Last 3 Encounters:  11/26/16 236 lb (107 kg)  10/28/16 234 lb (106.1 kg)  10/02/16 236 lb (107 kg)    Physical Exam  Constitutional: He is oriented to person, place, and time. No distress.  HENT:  Mouth/Throat: Oropharynx is clear and moist. No oropharyngeal exudate.  Eyes: Conjunctivae are normal. Right eye exhibits no discharge. Left eye exhibits no discharge. No scleral icterus.  Neck: Normal range of motion. Neck supple. No JVD present. No thyromegaly present.  Cardiovascular: Normal rate, regular rhythm and intact distal pulses.  Exam reveals no gallop and no friction rub.  No murmur heard. Pulmonary/Chest: Effort normal and breath sounds normal. No respiratory distress. He has no rales. He exhibits no tenderness.  Abdominal: Soft. Bowel sounds are normal. He exhibits no distension and no mass. There is no tenderness. There is no rebound and no guarding.  Musculoskeletal: Normal range of motion. He exhibits no edema, tenderness or deformity.  Lymphadenopathy:    He has no cervical adenopathy.  Neurological: He is alert  and oriented to person, place, and time.  Skin: Skin is warm and dry. No rash noted. He is not diaphoretic. No erythema. No pallor.  Vitals reviewed.   Lab Results  Component Value Date   WBC 5.7 10/10/2016   HGB 16.6 10/10/2016   HCT 47.1 10/10/2016   PLT 92.0 (L) 10/10/2016   GLUCOSE 368 (H) 10/10/2016   CHOL 171 10/10/2016   TRIG 89.0 10/10/2016   HDL 54.30 10/10/2016   LDLCALC 99 10/10/2016   ALT 10 10/10/2016   AST 12 10/10/2016   NA 137 10/10/2016   K 4.3 10/10/2016   CL 98 10/10/2016   CREATININE 1.04 10/10/2016   BUN 13 10/10/2016   CO2 31 10/10/2016   TSH 1.525 01/08/2016   PSA 7.65 (H) 10/10/2016   INR 1.2 (H) 01/31/2016   HGBA1C 10.7 11/26/2016   MICROALBUR 1.3 10/10/2016    Dg Finger Middle Left  Result Date: 10/28/2016 CLINICAL DATA:  Slammed middle finger in garage door 4 days ago. EXAM: LEFT MIDDLE FINGER 2+V COMPARISON:  None. FINDINGS: Soft tissue swelling and possible laceration over the tip of the left index finger. Soft tissue gas noted near the nail bed. No fracture, subluxation or dislocation. Joint spaces are maintained. IMPRESSION: No acute bony abnormality. Electronically Signed   By: Rolm Baptise M.D.   On: 10/28/2016 14:56    Assessment & Plan:   [name] was seen today for diabetes.  Diagnoses and all orders for this visit:  Obesity (BMI 30-39.9)- he agrees to improve his lifestyle modifications.  Type 2 diabetes mellitus with complication, with long-term current use of insulin ([place])- his A1c is better but remains high at 10.7%.  I have asked him to start using a basal insulin.  Will increase his dose of metformin and will add a DPP4 inhibitor.  Will continue the SGLT-2 inhibitor at the current dose. -     Ambulatory referral to Ophthalmology -     POCT glycosylated hemoglobin (Hb A1C) -     Amb Referral to Nutrition and Diabetic E -     Insulin Degludec (TRESIBA FLEXTOUCH) 200 UNIT/ML SOPN; Inject 40 Units into the skin daily. -      sitaGLIPtin-metformin (JANUMET) 50-1000 MG tablet; Take 1 tablet by mouth 2 (two) times daily with a meal. -     glucose blood (FREESTYLE LITE) test strip; Use as TID   I have discontinued Patrick Kidd's metFORMIN. I have also changed his glucose blood. Additionally, I am having him start on Insulin Degludec and sitaGLIPtin-metformin. Lastly, I am having him maintain his sildenafil, gabapentin, FREESTYLE FREEDOM LITE, pantoprazole, Vitamin D (Ergocalciferol), cilostazol, Iron, and dapagliflozin propanediol.  Meds ordered this encounter  Medications  . Insulin Degludec (TRESIBA FLEXTOUCH) 200 UNIT/ML SOPN    Sig: Inject 40 Units into the skin daily.    Dispense:  9 mL    Refill:  1  . sitaGLIPtin-metformin (JANUMET) 50-1000 MG tablet    Sig: Take 1 tablet by mouth 2 (two) times daily with a meal.    Dispense:  180  tablet    Refill:  1  . glucose blood (FREESTYLE LITE) test strip    Sig: Use as TID    Dispense:  100 each    Refill:  12     Follow-up: Return in about 3 months (around 02/26/2017).  Scarlette Calico, MD

## 2016-12-08 ENCOUNTER — Encounter (INDEPENDENT_AMBULATORY_CARE_PROVIDER_SITE_OTHER): Payer: Self-pay

## 2016-12-08 ENCOUNTER — Encounter: Payer: Self-pay | Admitting: Internal Medicine

## 2016-12-08 ENCOUNTER — Ambulatory Visit (INDEPENDENT_AMBULATORY_CARE_PROVIDER_SITE_OTHER): Payer: Medicare Other | Admitting: Internal Medicine

## 2016-12-08 ENCOUNTER — Other Ambulatory Visit (INDEPENDENT_AMBULATORY_CARE_PROVIDER_SITE_OTHER): Payer: Medicare Other

## 2016-12-08 VITALS — BP 112/70 | HR 80 | Ht 74.0 in | Wt 241.4 lb

## 2016-12-08 DIAGNOSIS — K7030 Alcoholic cirrhosis of liver without ascites: Secondary | ICD-10-CM

## 2016-12-08 LAB — PROTIME-INR
INR: 1.1 ratio — AB (ref 0.8–1.0)
PROTHROMBIN TIME: 12.3 s (ref 9.6–13.1)

## 2016-12-08 NOTE — Progress Notes (Signed)
Patrick Kidd 71 y.o. [birth year] [id number]  Assessment & Plan:   Encounter Diagnosis  Name Primary?  . Alcoholic cirrhosis of liver without ascites ([place]) Yes    I reminded the patient again in abstinence from alcohol was of the utmost importance.  I am not sure he grasps this.  Overall things are stable though his abdominal girth seems a bit greater to me and he could be having ascites.  He needs a screening ultrasound of the liver and will also evaluate the abdomen looking for possible ascites.  Check an INR as well.  Return to me within 6 months we will decide based upon INR and ultrasound results.    Subjective:   Chief Complaint: Follow-up of alcoholic cirrhosis and liver disease  HPI The patient returns for follow-up, last being seen in February which was a follow-up for acute duodenal ulcer with hemorrhage and alcoholic liver disease and cirrhosis.  Cirrhosis is based upon MRI imaging.  He does not have varices.  He did have mild portal gastropathy.  At this point he tells me he has been drinking again because "you told me my liver was not that sick" he has been moving to fat matter and downsizing which is very stressful. He says his wife is a pack rat and he has to discard things in a clandestine fashion.  When she is at work he takes things the [place] or gets them all the way.  He drinks vodka to relax he says maybe 1 time a week. Great toe pain at night that wakes him up and he cannot get relief.    His diabetes was out of control with hemoglobin A1c at 10.7% he is starting insulin and says he is much better now. Lab Results  Component Value Date   HGBA1C 10.7 11/26/2016     Wt Readings from Last 3 Encounters:  12/08/16 241 lb 6.4 oz (109.5 kg)  11/26/16 236 lb (107 kg)  10/28/16 234 lb (106.1 kg)    No Known Allergies Current Meds  Medication Sig  . Blood Glucose Monitoring Suppl (FREESTYLE FREEDOM LITE) w/Device KIT Use to check blood sugars 2 times  daily  . dapagliflozin propanediol (FARXIGA) 5 MG TABS tablet Take 5 mg by mouth daily.  Marland Kitchen gabapentin (NEURONTIN) 300 MG capsule Take one capsule four times a day as needed.   Marland Kitchen glucose blood (FREESTYLE LITE) test strip Use as TID  . Insulin Degludec (TRESIBA FLEXTOUCH) 200 UNIT/ML SOPN Inject 40 Units into the skin daily.  . metFORMIN (GLUCOPHAGE-XR) 500 MG 24 hr tablet 500 mg.  . pantoprazole (PROTONIX) 40 MG tablet Take 40 mg by mouth daily.  . sitaGLIPtin-metformin (JANUMET) 50-1000 MG tablet Take 1 tablet by mouth 2 (two) times daily with a meal.   Past Medical History:  Diagnosis Date  . Acrophobia   . Alcoholic cirrhosis ([place]) 9/67/8938  . Arthritis    foot by big toe  . Cataract    removed both eyes  . Chronic cough    PMH of  . Diabetes mellitus without complication ([place])   . Diverticulosis 07-03-2010   Colonoscopy.   . Duodenal ulcer hemorrhage   . GERD (gastroesophageal reflux disease)   . Gout   . Granuloma annulare   . Hx of adenomatous colonic polyps multiple  . Hydrocele   . Nonspecific elevation of levels of transaminase or lactic acid dehydrogenase (LDH)   . Peripheral neuropathy   . Plantar fasciitis    PMH of  .  Sleep apnea    no cpap   Past Surgical History:  Procedure Laterality Date  . CATARACT EXTRACTION, BILATERAL  12/2011    Dr Gershon Crane  . COLONOSCOPY    . COLONOSCOPY W/ POLYPECTOMY  07/03/2010   2 adenomas, diverticulosis on right. Dr [name]  . FLEXIBLE SIGMOIDOSCOPY  2000  . SIGMOIDOSCOPY    . WISDOM TOOTH EXTRACTION     Social History   Socioeconomic History  . Marital status: Married    Spouse name: Not on file  . Number of children: 2  . Years of education: Not on file  . Highest education level: Not on file                      Not on file  Occupational History  . Occupation: Adult nurse estate  Tobacco Use  . Smoking status: Former Smoker    Packs/day: 2.00    Years: 6.00    Pack years: 12.00    Types: Cigarettes                                             Social History Narrative   Fun/Hobby: Merchandiser, retail, grandchildren, [employer]   family history includes Leukemia in his father; Lung cancer in his mother.   Review of Systems As per HPI  Objective:   Physical Exam BP 112/70   Pulse 80   Ht 6' 2"  (1.88 m)   Wt 241 lb 6.4 oz (109.5 kg)   BMI 30.99 kg/m  Eyes anicteric Lungs cta Cor S1s2 no rmg abd distended soft nt w/ umbilical hernia - tiny and reducible Ext no edema

## 2016-12-08 NOTE — Patient Instructions (Addendum)
  Your physician has requested that you go to the basement for the following lab work before leaving today: PT/INR   You have been scheduled for an abdominal ultrasound at [facility] on 01/05/17 at 10:00AM. Please arrive 20 minutes prior to your appointment for registration. Make certain not to have anything to eat or drink 6 hours prior to your appointment. Should you need to reschedule your appointment, please contact radiology at [phone] This test typically takes about 30 minutes to perform.  Continue to try to stop drinking alcohol.   I appreciate the opportunity to care for you. Silvano Rusk, MD, [facility]

## 2016-12-08 NOTE — Progress Notes (Signed)
Let him know blood clotting is fine

## 2017-01-01 ENCOUNTER — Other Ambulatory Visit: Payer: Self-pay | Admitting: Neurology

## 2017-01-01 NOTE — Telephone Encounter (Signed)
Pt called and said the pharmacy has not received his prescription for gabapentin yet and he needs it

## 2017-01-01 NOTE — Telephone Encounter (Signed)
RX sent to pharmacy  

## 2017-01-05 ENCOUNTER — Other Ambulatory Visit: Payer: Medicare Other

## 2017-01-14 ENCOUNTER — Telehealth: Payer: Self-pay | Admitting: Internal Medicine

## 2017-01-14 NOTE — Telephone Encounter (Signed)
Copied from [place] [id number]. Topic: General - Other >> Jan 14, 2017 12:58 PM Marin Olp L wrote: Reason for CRM: Patient's insulin pen "tresiba" broke. Wants to know if he can get another sample kit in/from your office. Also handicapped placard expired and wants to know if the forms are available for renewal? Please call back to advise.

## 2017-01-15 NOTE — Telephone Encounter (Signed)
I have tresiba if pt wants to come in and pick up.

## 2017-01-15 NOTE — Telephone Encounter (Addendum)
LVM to inform patient we do have samples of medication if he would like to come pick this up.

## 2017-01-15 NOTE — Telephone Encounter (Signed)
Pt is going to come up today and get the samples

## 2017-02-09 ENCOUNTER — Other Ambulatory Visit: Payer: Self-pay

## 2017-02-09 MED ORDER — DAPAGLIFLOZIN PROPANEDIOL 5 MG PO TABS: 5.0000 mg | ORAL_TABLET | Freq: Every day | ORAL | 1 refills | Status: DC

## 2017-02-09 NOTE — Telephone Encounter (Signed)
Received fax from [facility] on [address] for refill request on farxiga. Pt is now established with [facility].

## 2017-02-12 ENCOUNTER — Telehealth: Payer: Self-pay | Admitting: Internal Medicine

## 2017-02-12 NOTE — Telephone Encounter (Signed)
Copied from [place] #[id number]. Topic: Quick Communication - Rx Refill/Question >> Feb 12, 2017 12:46 PM Scherrie Gerlach wrote: Medication: Insulin Degludec (TRESIBA FLEXTOUCH) 200 UNIT/ML SOPN   Pt states dr was going to call when you got samples in.  Wants to know if you have any?

## 2017-02-13 NOTE — Telephone Encounter (Signed)
We do not have any samples 

## 2017-02-24 ENCOUNTER — Telehealth: Payer: Self-pay | Admitting: Internal Medicine

## 2017-02-24 NOTE — Telephone Encounter (Signed)
Copied from [place]. Topic: Quick Communication - See Telephone Encounter >> Feb 24, 2017 11:03 AM Patrick Kidd wrote: CRM for notification. See Telephone encounter for:  Would like Dr Ronnald Ramp advice on doing Keto Tone? Please advise  Also the nurse was going to check on samples for insulin needles, he has not heard anything 02/24/17.

## 2017-02-24 NOTE — Telephone Encounter (Signed)
Would like A1C checked, please advise

## 2017-02-25 NOTE — Telephone Encounter (Signed)
LVM for pt to call back as soon as possible.   

## 2017-02-25 NOTE — Telephone Encounter (Signed)
I do not recommend a keto diet  It is too harsh and rigid  He has to wait at least 3 months to recheck his A1c Please ask him to make an appointment to see me for an A1c check sometime after January 26

## 2017-02-27 NOTE — Telephone Encounter (Signed)
Called pt and informed him to bring the pills for weight loss in with him. Pt also stated that he is not able to afford the [place] and that we may need to change him.

## 2017-02-27 NOTE — Telephone Encounter (Signed)
Pt returning [place] phone call, please f/u with pt.

## 2017-03-05 ENCOUNTER — Other Ambulatory Visit: Payer: Medicare Other

## 2017-03-05 ENCOUNTER — Encounter: Payer: Self-pay | Admitting: Internal Medicine

## 2017-03-05 ENCOUNTER — Ambulatory Visit (INDEPENDENT_AMBULATORY_CARE_PROVIDER_SITE_OTHER): Payer: Medicare Other | Admitting: Internal Medicine

## 2017-03-05 ENCOUNTER — Other Ambulatory Visit (INDEPENDENT_AMBULATORY_CARE_PROVIDER_SITE_OTHER): Payer: Medicare Other

## 2017-03-05 DIAGNOSIS — K7030 Alcoholic cirrhosis of liver without ascites: Secondary | ICD-10-CM | POA: Diagnosis not present

## 2017-03-05 DIAGNOSIS — D890 Polyclonal hypergammaglobulinemia: Secondary | ICD-10-CM | POA: Diagnosis not present

## 2017-03-05 DIAGNOSIS — D696 Thrombocytopenia, unspecified: Secondary | ICD-10-CM | POA: Diagnosis not present

## 2017-03-05 DIAGNOSIS — D729 Disorder of white blood cells, unspecified: Secondary | ICD-10-CM

## 2017-03-05 DIAGNOSIS — K7689 Other specified diseases of liver: Secondary | ICD-10-CM

## 2017-03-05 DIAGNOSIS — E118 Type 2 diabetes mellitus with unspecified complications: Secondary | ICD-10-CM | POA: Diagnosis not present

## 2017-03-05 DIAGNOSIS — Z794 Long term (current) use of insulin: Secondary | ICD-10-CM

## 2017-03-05 LAB — CBC WITH DIFFERENTIAL/PLATELET
BASOS PCT: 0.4 % (ref 0.0–3.0)
Basophils Absolute: 0 10*3/uL (ref 0.0–0.1)
EOS PCT: 1.7 % (ref 0.0–5.0)
Eosinophils Absolute: 0.1 10*3/uL (ref 0.0–0.7)
HEMATOCRIT: 50.2 % (ref 39.0–52.0)
HEMOGLOBIN: 17.5 g/dL — AB (ref 13.0–17.0)
LYMPHS PCT: 39 % (ref 12.0–46.0)
Lymphs Abs: 2.5 10*3/uL (ref 0.7–4.0)
MCHC: 34.8 g/dL (ref 30.0–36.0)
MCV: 92.6 fl (ref 78.0–100.0)
Monocytes Absolute: 0.6 10*3/uL (ref 0.1–1.0)
Monocytes Relative: 8.9 % (ref 3.0–12.0)
NEUTROS ABS: 3.2 10*3/uL (ref 1.4–7.7)
Neutrophils Relative %: 50 % (ref 43.0–77.0)
Platelets: 83 10*3/uL — ABNORMAL LOW (ref 150.0–400.0)
RBC: 5.43 Mil/uL (ref 4.22–5.81)
RDW: 14.3 % (ref 11.5–15.5)
WBC: 6.4 10*3/uL (ref 4.0–10.5)

## 2017-03-05 LAB — COMPREHENSIVE METABOLIC PANEL
ALK PHOS: 65 U/L (ref 39–117)
ALT: 9 U/L (ref 0–53)
AST: 15 U/L (ref 0–37)
Albumin: 4.2 g/dL (ref 3.5–5.2)
BUN: 16 mg/dL (ref 6–23)
CALCIUM: 9.3 mg/dL (ref 8.4–10.5)
CO2: 29 mEq/L (ref 19–32)
Chloride: 103 mEq/L (ref 96–112)
Creatinine, Ser: 1 mg/dL (ref 0.40–1.50)
GFR: 77.99 mL/min (ref >60.00)
Glucose, Bld: 157 mg/dL — ABNORMAL HIGH (ref 70–99)
POTASSIUM: 3.9 meq/L (ref 3.5–5.1)
Sodium: 139 mEq/L (ref 135–145)
TOTAL PROTEIN: 7.4 g/dL (ref 6.0–8.3)
Total Bilirubin: 1.7 mg/dL — ABNORMAL HIGH (ref 0.2–1.2)

## 2017-03-05 LAB — POCT GLYCOSYLATED HEMOGLOBIN (HGB A1C): Hemoglobin A1C: 6.9

## 2017-03-05 LAB — PROTIME-INR
INR: 1.1 ratio — ABNORMAL HIGH (ref 0.8–1.0)
Prothrombin Time: 11.7 s (ref 9.6–13.1)

## 2017-03-05 NOTE — Patient Instructions (Signed)

## 2017-03-05 NOTE — Progress Notes (Signed)
Subjective:  Patient ID: Patrick Kidd, male    DOB: 04-18-1945  Age: 72 y.o. MRN: [id number]  CC: Diabetes   HPI Patrick Kidd presents for a f/up on DM2 - He has not been using the basal insulin or taking Janumet.  He has been taking metformin and the SGLT-2 inhibitor as well as working on his lifestyle modifications.  He feels well today and offers no complaints.  He has a history of cirrhosis and wants to recheck his liver enzymes.  Outpatient Medications Prior to Visit  Medication Sig Dispense Refill  . Blood Glucose Monitoring Suppl (FREESTYLE FREEDOM LITE) w/Device KIT Use to check blood sugars 2 times daily 1 each 1  . dapagliflozin propanediol (FARXIGA) 5 MG TABS tablet Take 5 mg by mouth daily. 90 tablet 1  . gabapentin (NEURONTIN) 300 MG capsule Take one capsule four times a day as needed.     Marland Kitchen glucose blood (FREESTYLE LITE) test strip Use as TID 100 each 12  . metFORMIN (GLUCOPHAGE-XR) 500 MG 24 hr tablet 500 mg.  0  . pantoprazole (PROTONIX) 40 MG tablet Take 40 mg by mouth daily.    . sitaGLIPtin-metformin (JANUMET) 50-1000 MG tablet Take 1 tablet by mouth 2 (two) times daily with a meal. 180 tablet 1  . gabapentin (NEURONTIN) 300 MG capsule TAKE 1 CAPSULE(300 MG) BY MOUTH FOUR TIMES DAILY 120 capsule 5  . Insulin Degludec (TRESIBA FLEXTOUCH) 200 UNIT/ML SOPN Inject 40 Units into the skin daily. (Patient not taking: Reported on 03/05/2017) 9 mL 1   No facility-administered medications prior to visit.     ROS Review of Systems  Constitutional: Negative.  Negative for appetite change, diaphoresis, fatigue and unexpected weight change.  HENT: Negative.   Eyes: Negative for visual disturbance.  Respiratory: Negative.  Negative for cough, chest tightness, shortness of breath and wheezing.   Cardiovascular: Negative for chest pain, palpitations and leg swelling.  Gastrointestinal: Negative for abdominal pain, constipation, diarrhea, nausea and vomiting.  Endocrine:  Negative for polydipsia, polyphagia and polyuria.  Genitourinary: Negative.  Negative for difficulty urinating.  Musculoskeletal: Negative.  Negative for arthralgias, back pain and myalgias.  Skin: Negative.  Negative for color change and rash.  Allergic/Immunologic: Negative.   Neurological: Negative.  Negative for dizziness, weakness and light-headedness.  Hematological: Negative for adenopathy. Does not bruise/bleed easily.  Psychiatric/Behavioral: Negative.     Objective:  BP 138/80 (BP Location: Left Arm, Patient Position: Sitting, Cuff Size: Normal)   Pulse 93   Temp 98.1 F (36.7 C)   Resp 16   Ht 6' 2"  (1.88 m)   Wt 247 lb (112 kg)   SpO2 94%   BMI 31.71 kg/m   BP Readings from Last 3 Encounters:  03/05/17 138/80  12/08/16 112/70  11/26/16 138/74    Wt Readings from Last 3 Encounters:  03/05/17 247 lb (112 kg)  12/08/16 241 lb 6.4 oz (109.5 kg)  11/26/16 236 lb (107 kg)    Physical Exam  Constitutional: He is oriented to person, place, and time. No distress.  HENT:  Mouth/Throat: Oropharynx is clear and moist. No oropharyngeal exudate.  Eyes: Conjunctivae are normal. Left eye exhibits no discharge. No scleral icterus.  Neck: Normal range of motion. Neck supple. No JVD present. No thyromegaly present.  Cardiovascular: Normal rate, regular rhythm and normal heart sounds. Exam reveals no gallop.  No murmur heard. Pulmonary/Chest: Effort normal and breath sounds normal. No respiratory distress. He has no wheezes. He has no rales.  Abdominal: Soft. Bowel sounds are normal. He exhibits no mass. There is no hepatosplenomegaly. There is no tenderness. There is no guarding.  Musculoskeletal: Normal range of motion. He exhibits no edema or tenderness.  Lymphadenopathy:    He has no cervical adenopathy.  Neurological: He is alert and oriented to person, place, and time.  Skin: Skin is warm and dry. No rash noted. He is not diaphoretic. No erythema. No pallor.  Vitals  reviewed.   Lab Results  Component Value Date   WBC 6.4 03/05/2017   HGB 17.5 (H) 03/05/2017   HCT 50.2 03/05/2017   PLT 83.0 (L) 03/05/2017   GLUCOSE 157 (H) 03/05/2017   CHOL 171 10/10/2016   TRIG 89.0 10/10/2016   HDL 54.30 10/10/2016   LDLCALC 99 10/10/2016   ALT 9 03/05/2017   AST 15 03/05/2017   NA 139 03/05/2017   K 3.9 03/05/2017   CL 103 03/05/2017   CREATININE 1.00 03/05/2017   BUN 16 03/05/2017   CO2 29 03/05/2017   TSH 1.525 01/08/2016   PSA 7.65 (H) 10/10/2016   INR 1.1 (H) 03/05/2017   HGBA1C 6.9 03/05/2017   MICROALBUR 1.3 10/10/2016    Dg Finger Middle Left  Result Date: 10/28/2016 CLINICAL DATA:  Slammed middle finger in garage door 4 days ago. EXAM: LEFT MIDDLE FINGER 2+V COMPARISON:  None. FINDINGS: Soft tissue swelling and possible laceration over the tip of the left index finger. Soft tissue gas noted near the nail bed. No fracture, subluxation or dislocation. Joint spaces are maintained. IMPRESSION: No acute bony abnormality. Electronically Signed   By: Rolm Baptise M.D.   On: 10/28/2016 14:56    Assessment & Plan:   Hoby was seen today for diabetes.  Diagnoses and all orders for this visit:  Severe obesity with body mass index (BMI) of 35.0 to 39.9 with comorbidity (HCC)-he is working on his lifestyle modifications to treat this.  Polyclonal gammopathy- His total protein and CBC are normal.  Will continue to monitor this.  At this time he is having no complications. -     Comprehensive metabolic panel; Future  Alcoholic cirrhosis of liver without ascites ([place])- His LFTs and INR are stable.  No complications noted. -     Comprehensive metabolic panel; Future -     Protime-INR; Future  Liver cyst- his LFT's are normal, no complications noted -     Comprehensive metabolic panel; Future  Thrombocytopenia ([place])- This is related to the cirrhosis.  His platelet count remains mildly low but he is having no bleeding complications. -     CBC with  Differential/Platelet; Future -     Protime-INR; Future  Type 2 diabetes mellitus with complication, with long-term current use of insulin ([place])- His A1c is at 6.9%.  His blood sugars are adequately well controlled with metformin and the SGLT-2 inhibitor. -     POCT glycosylated hemoglobin (Hb A1C)   I have discontinued Hollice Espy B. Camuso "Bryan"'s Insulin Degludec and sitaGLIPtin-metformin. I am also having him maintain his gabapentin, FREESTYLE FREEDOM LITE, pantoprazole, glucose blood, metFORMIN, and dapagliflozin propanediol.  No orders of the defined types were placed in this encounter.    Follow-up: Return in about 4 months (around 07/03/2017).  Scarlette Calico, MD

## 2017-03-06 ENCOUNTER — Encounter: Payer: Self-pay | Admitting: Internal Medicine

## 2017-03-06 LAB — PATHOLOGIST SMEAR REVIEW

## 2017-03-11 ENCOUNTER — Telehealth: Payer: Self-pay | Admitting: Internal Medicine

## 2017-03-11 NOTE — Telephone Encounter (Signed)
Copied from [place]. Topic: Quick Communication - Lab Results >> Mar 11, 2017  4:04 PM Lolita Rieger, [place] wrote: Pt would like to speak to a nurse about his lab results please call pt @[phone] 

## 2017-03-12 DIAGNOSIS — E119 Type 2 diabetes mellitus without complications: Secondary | ICD-10-CM | POA: Diagnosis not present

## 2017-03-12 DIAGNOSIS — Z7984 Long term (current) use of oral hypoglycemic drugs: Secondary | ICD-10-CM | POA: Diagnosis not present

## 2017-03-12 DIAGNOSIS — Z961 Presence of intraocular lens: Secondary | ICD-10-CM | POA: Diagnosis not present

## 2017-03-12 DIAGNOSIS — H04123 Dry eye syndrome of bilateral lacrimal glands: Secondary | ICD-10-CM | POA: Diagnosis not present

## 2017-03-12 LAB — HM DIABETES EYE EXAM

## 2017-03-12 NOTE — Telephone Encounter (Signed)
Pt wanted to confirm the liver test results.

## 2017-03-19 ENCOUNTER — Telehealth: Payer: Self-pay | Admitting: Internal Medicine

## 2017-03-19 NOTE — Telephone Encounter (Signed)
Pt informed and stated understanding

## 2017-03-19 NOTE — Telephone Encounter (Signed)
Copied from [place] [id number]. Topic: Quick Communication - See Telephone Encounter >> Mar 19, 2017  1:03 PM Cleaster Corin, NT wrote: CRM for notification. See Telephone encounter for:   03/19/17. Pt. Calling he received a automated call stating  that he needed to make an appt. To have his A1C checked but pt. Said he had just had it checked a couple weeks ago. Just wants to make sure he doesn't need to schedule another appt. Pt. Can be reached at [phone]

## 2017-03-19 NOTE — Telephone Encounter (Signed)
Automated calls are going out regarding A1C checks. Does he need to have this checked again?

## 2017-04-06 ENCOUNTER — Telehealth: Payer: Self-pay | Admitting: Internal Medicine

## 2017-04-06 NOTE — Telephone Encounter (Signed)
Patient called to ask about the request for Janumet. I advised the note from the last OV says this was discontinued. He said "he discontinued the metformin. Janumet was given to me by the nurse in the office, that's probably why it is not showing up on my medication list. Also can I get the Viagra filled. I advised these requests will go to the provider for review, he verbalized understanding and wants me to let him know that he is out of the [place] completely.    Last OV: 03/05/17 PCP: Ronnald Ramp Pharmacy: [facility] [place], [place] [phone] (Phone) [phone] (Fax)

## 2017-04-06 NOTE — Telephone Encounter (Signed)
Copied from [place]. Topic: Quick Communication - Rx Refill/Question >> Apr 06, 2017 10:30 AM Lolita Rieger, RMA wrote: Medication: janumet 50 mg/100 mg sidenifil 100 mg   Has the patient contacted their pharmacy? yes   (Agent: If no, request that the patient contact the pharmacy for the refill.)   Preferred Pharmacy (with phone number or street name): [facility] on [address]   Agent: Please be advised that RX refills may take up to 3 business days. We ask that you follow-up with your pharmacy.

## 2017-04-07 ENCOUNTER — Other Ambulatory Visit: Payer: Self-pay | Admitting: Internal Medicine

## 2017-04-07 DIAGNOSIS — N5201 Erectile dysfunction due to arterial insufficiency: Secondary | ICD-10-CM | POA: Insufficient documentation

## 2017-04-07 MED ORDER — SILDENAFIL CITRATE 20 MG PO TABS: 80.0000 mg | ORAL_TABLET | Freq: Every day | ORAL | 11 refills | Status: DC | PRN

## 2017-04-07 MED ORDER — METFORMIN HCL ER 500 MG PO TB24: 500.0000 mg | ORAL_TABLET | Freq: Every day | ORAL | 3 refills | Status: DC

## 2017-04-07 MED ORDER — DAPAGLIFLOZIN PROPANEDIOL 5 MG PO TABS: 5.0000 mg | ORAL_TABLET | Freq: Every day | ORAL | 1 refills | Status: DC

## 2017-04-07 NOTE — Telephone Encounter (Signed)
Pt is requesting a refill of viagra. Please advise.   Pt informed that he is taking the farxiga now and not the janumet. Pt stated understanding.

## 2017-04-07 NOTE — Addendum Note (Signed)
Addended by: Karle Barr on: 04/07/2017 08:13 AM   Modules accepted: Orders

## 2017-04-07 NOTE — Telephone Encounter (Signed)
RX sent

## 2017-06-04 DIAGNOSIS — L920 Granuloma annulare: Secondary | ICD-10-CM | POA: Diagnosis not present

## 2017-06-04 DIAGNOSIS — L821 Other seborrheic keratosis: Secondary | ICD-10-CM | POA: Diagnosis not present

## 2017-06-04 DIAGNOSIS — L820 Inflamed seborrheic keratosis: Secondary | ICD-10-CM | POA: Diagnosis not present

## 2017-07-01 ENCOUNTER — Encounter: Payer: Self-pay | Admitting: Family Medicine

## 2017-07-07 ENCOUNTER — Telehealth: Payer: Self-pay | Admitting: Emergency Medicine

## 2017-07-07 NOTE — Telephone Encounter (Signed)
error 

## 2017-08-14 ENCOUNTER — Other Ambulatory Visit: Payer: Self-pay | Admitting: Physician Assistant

## 2017-08-17 ENCOUNTER — Ambulatory Visit (INDEPENDENT_AMBULATORY_CARE_PROVIDER_SITE_OTHER): Payer: Medicare Other | Admitting: Neurology

## 2017-08-17 ENCOUNTER — Encounter: Payer: Self-pay | Admitting: Neurology

## 2017-08-17 VITALS — BP 150/62 | HR 99 | Ht 74.0 in | Wt 242.0 lb

## 2017-08-17 DIAGNOSIS — E1142 Type 2 diabetes mellitus with diabetic polyneuropathy: Secondary | ICD-10-CM | POA: Diagnosis not present

## 2017-08-17 NOTE — Patient Instructions (Signed)
Continue gabapentin 300mg  at bedtime Follow up in one year

## 2017-08-17 NOTE — Progress Notes (Signed)
NEUROLOGY FOLLOW UP OFFICE NOTE  Patrick Kidd [id number]  HISTORY OF PRESENT ILLNESS: Patrick Kidd is a 72 y.o. right-handed male who follows up for diabetic peripheral neuropathy.     UPDATE: He is taking gabapentin 381m at bedtime.  He denies pain, just numbness.  It has not progressed.  Most recent Hgb A1c was 6.9%.  He is feeling well.  He started yoga every Friday.  He walks 100 laps in the pool.   HISTORY: Onset of symptoms began in 2010.  He describes initially experiencing numbness on the bottom of both feet, in which recently it has spread to the ankles. It is also associated with an uncomfortable tingling sensation. It is not painful but it is uncomfortable and usually bothers him at night.  He doesn't really notice it if he is active.  He is concerned about his balance.  The first time, he was getting out of a boat and felt that his right leg was not strong enough to hold his weight and he fell to his knees. The second time, he tripped over a chair and fell down to his knees.  He felt that his leg just gave out when it paired his weight. He is particularly concerned, because he does enjoy riding on a boat and dancing.  More recently, he stood up from a chair at a game and felt shaky and unsteady.  He has not been exercising as much due to fear of imbalance.  He denies any pain radiating down the legs.  He has pain in his right big toe after an 8 pound frozen duck breast fell on it.  He has tingling in the finger tips.   Labs from 08/04/12 revealed ESR 29, ANA negative, Lyme negative.  SPEP revealed possible faint restricted band in the gamma region, IFE revealed polyclonal gammopathy involving IgA and IgM.  Prior labs last month revealed Hgb A1c 5.7, TSH 2.42, RPR non-reactive, serum glucose 131.  CBC and CMP otherwise unremarkable.  B12 from 5/12 was 1293.  Fasting glucose was 143 and 2 hour glucose tolerance test was 341.  However, he was diagnosed with type 2 diabetes  after Hgb A1c from 01/09/16 was found to be 8.4.  PAST MEDICAL HISTORY: Past Medical History:  Diagnosis Date  . Acrophobia   . Alcoholic cirrhosis ([place] 84/33/2951 . Arthritis    foot by big toe  . Cataract    removed both eyes  . Chronic cough    PMH of  . Diabetes mellitus without complication ([place]   . Diverticulosis 07-03-2010   Colonoscopy.   . Duodenal ulcer hemorrhage   . GERD (gastroesophageal reflux disease)   . Gout   . Granuloma annulare   . Hx of adenomatous colonic polyps multiple  . Hydrocele   . Nonspecific elevation of levels of transaminase or lactic acid dehydrogenase (LDH)   . Peripheral neuropathy   . Plantar fasciitis    PMH of  . Sleep apnea    no cpap    MEDICATIONS: Current Outpatient Medications on File Prior to Visit  Medication Sig Dispense Refill  . Blood Glucose Monitoring Suppl (FREESTYLE FREEDOM LITE) w/Device KIT Use to check blood sugars 2 times daily 1 each 1  . dapagliflozin propanediol (FARXIGA) 5 MG TABS tablet Take 5 mg by mouth daily. 90 tablet 1  . gabapentin (NEURONTIN) 300 MG capsule Take one capsule four times a day as needed.     .Marland Kitchenglucose blood (FREESTYLE LITE) test  strip Use as TID 100 each 12  . metFORMIN (GLUCOPHAGE-XR) 500 MG 24 hr tablet Take 1 tablet (500 mg total) by mouth daily with breakfast. 90 tablet 3  . pantoprazole (PROTONIX) 40 MG tablet Take 40 mg by mouth daily.    . pantoprazole (PROTONIX) 40 MG tablet TAKE 1 TABLET(40 MG) BY MOUTH TWICE DAILY 180 tablet 0  . sildenafil (REVATIO) 20 MG tablet Take 4 tablets (80 mg total) by mouth daily as needed. 60 tablet 11   No current facility-administered medications on file prior to visit.     ALLERGIES: No Known Allergies  FAMILY HISTORY: Family History  Problem Relation Age of Onset  . Lung cancer Mother        smoker  . Leukemia Father        Acute myelocytic  . Diabetes Neg Hx   . Stroke Neg Hx   . Heart disease Neg Hx   . Colon cancer Neg Hx   . Colon  polyps Neg Hx   . Esophageal cancer Neg Hx   . Rectal cancer Neg Hx   . Stomach cancer Neg Hx     SOCIAL HISTORY: Social History   Socioeconomic History  . Marital status: Married    Spouse name: Not on file  . Number of children: 2  . Years of education: Not on file  . Highest education level: Not on file  Occupational History  . Occupation: Adult nurse estate  Social Needs  . Financial resource strain: Not on file  . Food insecurity:    Worry: Not on file    Inability: Not on file  . Transportation needs:    Medical: Not on file    Non-medical: Not on file  Tobacco Use  . Smoking status: Former Smoker    Packs/day: 2.00    Years: 6.00    Pack years: 12.00    Types: Cigarettes    Last attempt to quit: 02/04/1971    Years since quitting: 46.5  . Smokeless tobacco: Never Used  . Tobacco comment: smoked age [age], up to 2 ppd  Substance and Sexual Activity  . Alcohol use: No  . Drug use: No  . Sexual activity: Yes    Partners: Female  Lifestyle  . Physical activity:    Days per week: Not on file    Minutes per session: Not on file  . Stress: Not on file  Relationships  . Social connections:    Talks on phone: Not on file    Gets together: Not on file    Attends religious service: Not on file    Active member of club or organization: Not on file    Attends meetings of clubs or organizations: Not on file    Relationship status: Not on file  . Intimate partner violence:    Fear of current or ex partner: Not on file    Emotionally abused: Not on file    Physically abused: Not on file    Forced sexual activity: Not on file  Other Topics Concern  . Not on file  Social History Narrative   Fun/Hobby: Play golf, grandchildren, [employer]    REVIEW OF SYSTEMS: Constitutional: No fevers, chills, or sweats, no generalized fatigue, change in appetite Eyes: No visual changes, double vision, eye pain Ear, nose and throat: No hearing loss, ear pain, nasal congestion, sore  throat Cardiovascular: No chest pain, palpitations Respiratory:  No shortness of breath at rest or with exertion, wheezes GastrointestinaI: No nausea,  vomiting, diarrhea, abdominal pain, fecal incontinence Genitourinary:  No dysuria, urinary retention or frequency Musculoskeletal:  No neck pain, back pain Integumentary: No rash, pruritus, skin lesions Neurological: as above Psychiatric: No depression, insomnia, anxiety Endocrine: No palpitations, fatigue, diaphoresis, mood swings, change in appetite, change in weight, increased thirst Hematologic/Lymphatic:  No purpura, petechiae. Allergic/Immunologic: no itchy/runny eyes, nasal congestion, recent allergic reactions, rashes  PHYSICAL EXAM: Vitals:   08/17/17 1444  BP: (!) 150/62  Pulse: 99  SpO2: 93%   General: No acute distress.  Patient appears well-groomed.   Head:  Normocephalic/atraumatic Eyes:  Fundi examined but not visualized Neck: supple, no paraspinal tenderness, full range of motion Heart:  Regular rate and rhythm Lungs:  Clear to auscultation bilaterally Back: No paraspinal tenderness Neurological Exam: alert and oriented to person, place, and time. Attention span and concentration intact, recent and remote memory intact, fund of knowledge intact.  Speech fluent and not dysarthric, language intact.  CN II-XII intact. Bulk and tone normal, muscle strength 5/5 throughout.  Sensation to vibration and vibration reduced up to ankles.  Deep tendon reflexes absent throughout, toes downgoing.  Finger to nose testing intact.  Gait normal, Romberg positive.  IMPRESSION: Diabetic polyneuropathy, stable  PLAN: Gabapentin 371m at bedtime Continue yoga/exercise Follow up in one year or as needed  16 minutes spent face to face with patient, over 50% spent discussing management.  AMetta Clines DO  CC:  TScarlette Calico MD

## 2017-08-18 ENCOUNTER — Other Ambulatory Visit: Payer: Self-pay | Admitting: Internal Medicine

## 2017-08-18 ENCOUNTER — Telehealth: Payer: Self-pay | Admitting: *Deleted

## 2017-08-18 DIAGNOSIS — N5201 Erectile dysfunction due to arterial insufficiency: Secondary | ICD-10-CM

## 2017-08-18 MED ORDER — AVANAFIL 200 MG PO TABS: 1.0000 | ORAL_TABLET | Freq: Every day | ORAL | 5 refills | Status: DC | PRN

## 2017-08-18 NOTE — Telephone Encounter (Signed)
RX for [employer] sent to speciality pharmacy [facility] in [place]

## 2017-08-18 NOTE — Telephone Encounter (Signed)
Copied from [place] [id number]. Topic: General - Other >> Aug 18, 2017 11:27 AM Judyann Munson wrote: Reason for CRM:  patient called in regards to medication sildenafil (REVATIO) 20 MG tablet, stated its not really working for him and is requesting something stronger to be called in. His preferred pharmacy: [facility] [place], [place] [phone] (Phone) [phone] (Fax)    Please advice

## 2017-08-18 NOTE — Telephone Encounter (Signed)
Notified pt MD sent alternative to [facility]. Pt states he does not use that pharmacy would like [facility], Resent rx to correct pharmacy...Lind Guest

## 2017-08-19 ENCOUNTER — Other Ambulatory Visit: Payer: Self-pay | Admitting: Internal Medicine

## 2017-08-19 DIAGNOSIS — N5201 Erectile dysfunction due to arterial insufficiency: Secondary | ICD-10-CM

## 2017-08-19 MED ORDER — AVANAFIL 200 MG PO TABS: 1.0000 | ORAL_TABLET | Freq: Every day | ORAL | 5 refills | Status: DC | PRN

## 2017-08-20 NOTE — Telephone Encounter (Signed)
Pt called in stating the med is not covered by insurance and it $700. Could something else be sent in to cover insurance

## 2017-08-20 NOTE — Telephone Encounter (Signed)
Contacted pt and informed that the rx was sent to the specialty pharmacy due to the cheaper cost of the medication. Pt will call [facility] to see what price he can get it for from them.

## 2017-10-07 NOTE — Progress Notes (Addendum)
Subjective:   Patrick Kidd is a 72 y.o. male who presents for Medicare Annual/Subsequent preventive examination.  Review of Systems:  No ROS.  Medicare Wellness Visit. Additional risk factors are reflected in the social history.  Cardiac Risk Factors include: advanced age (>82mn, >>20women);diabetes mellitus Sleep patterns: feels rested on waking, gets up 1 times nightly to void and sleeps 6 hours nightly.    Home Safety/Smoke Alarms: Feels safe in home. Smoke alarms in place.  Living environment; residence and Firearm Safety: 1-story house/ trailer, no firearms Lives with wife , no needs for DME, good support system Seat Belt Safety/Bike Helmet: Wears seat belt.   PSA-  Lab Results  Component Value Date   PSA 7.65 (H) 10/10/2016   PSA 7.07 (H) 11/15/2014   PSA 5.03 (H) 07/16/2012       Objective:    Vitals: BP (!) 146/72   Pulse 92   Resp 18   Ht _0  (1.88 m)   Wt 242 lb (109.8 kg)   SpO2 98%   BMI 31.07 kg/m   Body mass index is 31.07 kg/m.  Advanced Directives 10/08/2017 03/25/2016 01/08/2016 01/07/2016 01/07/2016 10/04/2015 09/05/2015  Does Patient Have a Medical Advance Directive? _1  No No  Would patient like information on creating a medical advance directive? Yes (ED - Information included in AVS) No - Patient declined No - Patient declined No - Patient declined - - No - patient declined information    Tobacco Social History   Tobacco Use  Smoking Status Former Smoker  . Packs/day: 2.00  . Years: 6.00  . Pack years: 12.00  . Types: Cigarettes  . Last attempt to quit: 02/04/1971  . Years since quitting: 46.7  Smokeless Tobacco Never Used  Tobacco Comment   smoked age [age] up to 2 ppd     Counseling given: Not Answered Comment: smoked age [age] up to 2 ppd  Past Medical History:  Diagnosis Date  . Acrophobia   . [place]olic [place] . Arthritis    foot by big toe  . Cataract    removed both eyes  . Chronic cough     PMH of  . Diabetes mellitus without complication ([place]   . Diverticulosis 07-03-2010   Colonoscopy.   . Duodenal ulcer hemorrhage   . GERD (gastroesophageal reflux disease)   . Gout   . Granuloma annulare   . Hx of adenomatous colonic polyps multiple  . Hydrocele   . Nonspecific elevation of levels of transaminase or lactic acid dehydrogenase (LDH)   . Peripheral neuropathy   . Plantar fasciitis    PMH of  . Sleep apnea    no cpap   Past Surgical History:  Procedure Laterality Date  . CATARACT EXTRACTION, BILATERAL  12/2011    Dr SGershon Crane . COLONOSCOPY    . COLONOSCOPY W/ POLYPECTOMY  07/03/2010   2 adenomas, diverticulosis on right. Dr GCarlean Purl . ESOPHAGOGASTRODUODENOSCOPY (EGD) WITH PROPOFOL N/A 01/08/2016   Procedure: ESOPHAGOGASTRODUODENOSCOPY (EGD) WITH PROPOFOL;  Surgeon: MLadene Artist MD;  Location: [facility];  Service: Endoscopy;  Laterality: N/A;  . FLEXIBLE SIGMOIDOSCOPY  2000  . SIGMOIDOSCOPY    . WISDOM TOOTH EXTRACTION     Family History  Problem Relation Age of Onset  . Lung cancer Mother        smoker  . Leukemia Father        Acute myelocytic  . Diabetes Neg Hx   .  Stroke Neg Hx   . Heart disease Neg Hx   . Colon cancer Neg Hx   . Colon polyps Neg Hx   . Esophageal cancer Neg Hx   . Rectal cancer Neg Hx   . Stomach cancer Neg Hx    Social History   Socioeconomic History  . Marital status: Married    Spouse name: Not on file  . Number of children: 2  . Years of education: Not on file  . Highest education level: Not on file  Occupational History  . Occupation: Adult nurse estate  Social Needs  . Financial resource strain: Not hard at all  . Food insecurity:    Worry: Never true    Inability: Never true  . Transportation needs:    Medical: No    Non-medical: No  Tobacco Use  . Smoking status: Former Smoker    Packs/day: 2.00    Years: 6.00    Pack years: 12.00    Types: Cigarettes    Last attempt to quit: 02/04/1971    Years  since quitting: 46.7  . Smokeless tobacco: Never Used  . Tobacco comment: smoked age [age], up to 2 ppd  Substance and Sexual Activity  . Alcohol use: Yes  . Drug use: No  . Sexual activity: Yes    Partners: Female  Lifestyle  . Physical activity:    Days per week: 1 day    Minutes per session: 50 min  . Stress: Not at all  Relationships  . Social connections:    Talks on phone: More than three times a week    Gets together: More than three times a week    Attends religious service: Not on file    Active member of club or organization: Not on file    Attends meetings of clubs or organizations: Not on file    Relationship status: Married  Other Topics Concern  . Not on file  Social History Narrative   Fun/Hobby: Play golf, grandchildren, [employer]    Outpatient Encounter Medications as of 10/08/2017  Medication Sig  . Avanafil (STENDRA) 200 MG TABS Take 1 tablet by mouth daily as needed.  . Blood Glucose Monitoring Suppl (FREESTYLE FREEDOM LITE) w/Device KIT Use to check blood sugars 2 times daily  . dapagliflozin propanediol (FARXIGA) 5 MG TABS tablet Take 5 mg by mouth daily.  Marland Kitchen gabapentin (NEURONTIN) 300 MG capsule Take one capsule four times a day as needed.   Marland Kitchen glucose blood (FREESTYLE LITE) test strip Use as TID  . metFORMIN (GLUCOPHAGE-XR) 500 MG 24 hr tablet Take 1 tablet (500 mg total) by mouth daily with breakfast.  . pantoprazole (PROTONIX) 40 MG tablet TAKE 1 TABLET(40 MG) BY MOUTH TWICE DAILY  . [DISCONTINUED] metFORMIN (GLUCOPHAGE-XR) 500 MG 24 hr tablet Take 1 tablet (500 mg total) by mouth daily with breakfast.   No facility-administered encounter medications on file as of 10/08/2017.     Activities of Daily Living In your present state of health, do you have any difficulty performing the following activities: 10/08/2017  Hearing? N  Vision? N  Difficulty concentrating or making decisions? N  Walking or climbing stairs? N  Dressing or bathing? N  Doing errands,  shopping? N  Preparing Food and eating ? N  Using the Toilet? N  In the past six months, have you accidently leaked urine? N  Do you have problems with loss of bowel control? N  Managing your Medications? N  Managing your Finances? N  Housekeeping or managing your Housekeeping? N  Some recent data might be hidden    Patient Care Team: [name], MD as PCP - General (Internal Medicine)   Assessment:   This is a routine wellness examination for [place]. Physical assessment deferred to PCP.   Exercise Activities and Dietary recommendations Current Exercise Habits: Structured exercise class, Type of exercise: walking(chair yoga class), Time (Minutes): 50, Frequency (Times/Week): 2, Weekly Exercise (Minutes/Week): 100, Intensity: Mild, Exercise limited by: orthopedic condition(s)  Diet (meal preparation, eat out, water intake, caffeinated beverages, dairy products, fruits and vegetables): in general, a "healthy" diet  , well balanced   Reviewed heart healthy and diabetic diet. Encouraged patient to increase daily water and healthy fluid intake.  Goals    . patient     Wants to feel better        Fall Risk Fall Risk  10/08/2017 08/17/2017 10/02/2016 03/25/2016 09/04/2015  Falls in the past year? _0   Comment - - - - -  Number falls in past yr: - - - - -  Injury with Fall? - - - - -  Comment - - - - -  Risk for fall due to : Impaired balance/gait;Impaired mobility - - - -  Follow up - - - - -    Depression Screen PHQ 2/9 Scores 10/08/2017 10/02/2016 03/25/2016 09/04/2015  PHQ - 2 Score 0 0 0 0    Cognitive Function MMSE - Mini Mental State Exam 09/04/2015  Not completed: (No Data)       Ad8 score reviewed for issues:  Issues making decisions: no  Less interest in hobbies / activities: no  Repeats questions, stories (family complaining): no  Trouble using ordinary gadgets (microwave, computer, phone):no  Forgets the month or year: no  Mismanaging finances:  no  Remembering appts: no  Daily problems with thinking and/or memory: no Ad8 score is= 0  Immunization History  Administered Date(s) Administered  . Hep A / Hep B 11/02/2015  . Influenza, High Dose Seasonal PF 10/28/2016  . Influenza,inj,Quad PF,6+ Mos 01/10/2016  . Pneumococcal Conjugate-13 09/04/2015  . Pneumococcal Polysaccharide-23 06/05/2011  . Tdap 06/03/2009, 10/28/2016   Screening Tests Health Maintenance  Topic Date Due  . HEMOGLOBIN A1C  09/02/2017  . INFLUENZA VACCINE  09/03/2017  . URINE MICROALBUMIN  10/10/2017  . FOOT EXAM  11/26/2017  . OPHTHALMOLOGY EXAM  03/12/2018  . COLONOSCOPY  10/18/2018  . TETANUS/TDAP  10/29/2026  . Hepatitis C Screening  Completed  . PNA vac Low Risk Adult  Completed      Plan:     Continue doing brain stimulating activities (puzzles, reading, adult coloring books, staying active) to keep memory sharp.   Continue to eat heart healthy diet (full of fruits, vegetables, whole grains, lean protein, water--limit salt, fat, and sugar intake) and increase physical activity as tolerated.   I have personally reviewed and noted the following in the patient's chart:   . Medical and social history . Use of alcohol, tobacco or illicit drugs  . Current medications and supplements . Functional ability and status . Nutritional status . Physical activity . Advanced directives . List of other physicians . Vitals . Screenings to include cognitive, depression, and falls . Referrals and appointments  In addition, I have reviewed and discussed with patient certain preventive protocols, quality metrics, and best practice recommendations. A written personalized care plan for preventive services as well as general preventive health recommendations were provided to patient.  Michiel Cowboy, RN  10/08/2017  Medical screening examination/treatment/procedure(s) were performed by non-physician practitioner and as supervising physician I was  immediately available for consultation/collaboration. I agree with above. Scarlette Calico, MD

## 2017-10-08 ENCOUNTER — Ambulatory Visit (INDEPENDENT_AMBULATORY_CARE_PROVIDER_SITE_OTHER): Payer: Medicare Other | Admitting: *Deleted

## 2017-10-08 VITALS — BP 146/72 | HR 92 | Resp 18 | Ht 74.0 in | Wt 242.0 lb

## 2017-10-08 DIAGNOSIS — E118 Type 2 diabetes mellitus with unspecified complications: Secondary | ICD-10-CM

## 2017-10-08 DIAGNOSIS — Z0000 Encounter for general adult medical examination without abnormal findings: Secondary | ICD-10-CM

## 2017-10-08 DIAGNOSIS — Z794 Long term (current) use of insulin: Secondary | ICD-10-CM

## 2017-10-08 MED ORDER — METFORMIN HCL ER 500 MG PO TB24: 500.0000 mg | ORAL_TABLET | Freq: Every day | ORAL | 3 refills | Status: DC

## 2017-10-08 NOTE — Patient Instructions (Addendum)
Continue doing brain stimulating activities (puzzles, reading, adult coloring books, staying active) to keep memory sharp.   Continue to eat heart healthy diet (full of fruits, vegetables, whole grains, lean protein, water--limit salt, fat, and sugar intake) and increase physical activity as tolerated.   Health Maintenance, Male A healthy lifestyle and preventive care is important for your health and wellness. Ask your health care provider about what schedule of regular examinations is right for you. What should I know about weight and diet? Eat a Healthy Diet  Eat plenty of vegetables, fruits, whole grains, low-fat dairy products, and lean protein.  Do not eat a lot of foods high in solid fats, added sugars, or salt.  Maintain a Healthy Weight Regular exercise can help you achieve or maintain a healthy weight. You should:  Do at least 150 minutes of exercise each week. The exercise should increase your heart rate and make you sweat (moderate-intensity exercise).  Do strength-training exercises at least twice a week.  Watch Your Levels of Cholesterol and Blood Lipids  Have your blood tested for lipids and cholesterol every 5 years starting at 73 years of age. If you are at high risk for heart disease, you should start having your blood tested when you are 73 years old. You may need to have your cholesterol levels checked more often if: ? Your lipid or cholesterol levels are high. ? You are older than 73 years of age. ? You are at high risk for heart disease.  What should I know about cancer screening? Many types of cancers can be detected early and may often be prevented. Lung Cancer  You should be screened every year for lung cancer if: ? You are a current smoker who has smoked for at least 30 years. ? You are a former smoker who has quit within the past 15 years.  Talk to your health care provider about your screening options, when you should start screening, and how often you  should be screened.  Colorectal Cancer  Routine colorectal cancer screening usually begins at 73 years of age and should be repeated every 5-10 years until you are 73 years old. You may need to be screened more often if early forms of precancerous polyps or small growths are found. Your health care provider may recommend screening at an earlier age if you have risk factors for colon cancer.  Your health care provider may recommend using home test kits to check for hidden blood in the stool.  A small camera at the end of a tube can be used to examine your colon (sigmoidoscopy or colonoscopy). This checks for the earliest forms of colorectal cancer.  Prostate and Testicular Cancer  Depending on your age and overall health, your health care provider may do certain tests to screen for prostate and testicular cancer.  Talk to your health care provider about any symptoms or concerns you have about testicular or prostate cancer.  Skin Cancer  Check your skin from head to toe regularly.  Tell your health care provider about any new moles or changes in moles, especially if: ? There is a change in a mole's size, shape, or color. ? You have a mole that is larger than a pencil eraser.  Always use sunscreen. Apply sunscreen liberally and repeat throughout the day.  Protect yourself by wearing long sleeves, pants, a wide-brimmed hat, and sunglasses when outside.  What should I know about heart disease, diabetes, and high blood pressure?  If you are 18-39   years of age, have your blood pressure checked every 3-5 years. If you are 40 years of age or older, have your blood pressure checked every year. You should have your blood pressure measured twice-once when you are at a hospital or clinic, and once when you are not at a hospital or clinic. Record the average of the two measurements. To check your blood pressure when you are not at a hospital or clinic, you can use: ? An automated blood pressure  machine at a pharmacy. ? A home blood pressure monitor.  Talk to your health care provider about your target blood pressure.  If you are between 45-79 years old, ask your health care provider if you should take aspirin to prevent heart disease.  Have regular diabetes screenings by checking your fasting blood sugar level. ? If you are at a normal weight and have a low risk for diabetes, have this test once every three years after the age of 45. ? If you are overweight and have a high risk for diabetes, consider being tested at a younger age or more often.  A one-time screening for abdominal aortic aneurysm (AAA) by ultrasound is recommended for men aged 65-75 years who are current or former smokers. What should I know about preventing infection? Hepatitis B If you have a higher risk for hepatitis B, you should be screened for this virus. Talk with your health care provider to find out if you are at risk for hepatitis B infection. Hepatitis C Blood testing is recommended for:  Everyone born from 1945 through 1965.  Anyone with known risk factors for hepatitis C.  Sexually Transmitted Diseases (STDs)  You should be screened each year for STDs including gonorrhea and chlamydia if: ? You are sexually active and are younger than 73 years of age. ? You are older than 73 years of age and your health care provider tells you that you are at risk for this type of infection. ? Your sexual activity has changed since you were last screened and you are at an increased risk for chlamydia or gonorrhea. Ask your health care provider if you are at risk.  Talk with your health care provider about whether you are at high risk of being infected with HIV. Your health care provider may recommend a prescription medicine to help prevent HIV infection.  What else can I do?  Schedule regular health, dental, and eye exams.  Stay current with your vaccines (immunizations).  Do not use any tobacco products,  such as cigarettes, chewing tobacco, and e-cigarettes. If you need help quitting, ask your health care provider.  Limit alcohol intake to no more than 2 drinks per day. One drink equals 12 ounces of beer, 5 ounces of wine, or 1 ounces of hard liquor.  Do not use street drugs.  Do not share needles.  Ask your health care provider for help if you need support or information about quitting drugs.  Tell your health care provider if you often feel depressed.  Tell your health care provider if you have ever been abused or do not feel safe at home. This information is not intended to replace advice given to you by your health care provider. Make sure you discuss any questions you have with your health care provider. Document Released: 07/19/2007 Document Revised: 09/19/2015 Document Reviewed: 10/24/2014 Elsevier Interactive Patient Education  2018 [employer]  

## 2017-10-09 ENCOUNTER — Other Ambulatory Visit: Payer: Self-pay | Admitting: Internal Medicine

## 2017-10-09 ENCOUNTER — Telehealth: Payer: Self-pay | Admitting: *Deleted

## 2017-10-09 DIAGNOSIS — K2100 Gastro-esophageal reflux disease with esophagitis, without bleeding: Secondary | ICD-10-CM

## 2017-10-09 NOTE — Telephone Encounter (Signed)
Called and LVM for patient that PCP recommends for patient to take Gaviscon. Nurse left her contact number on VM and requested that patient call her back if he had any further questions or concerns.

## 2017-10-09 NOTE — Telephone Encounter (Signed)
Is he taking Pantoprazole? If he is then I recommend that he add gaviscon as needed

## 2017-10-09 NOTE — Telephone Encounter (Signed)
During AWV, patient states he is experiencing reflux that sporadically comes up in large volumes. He cannot control this and it has become embarrassing, it has happens during social events. Patient wants to know what  PCP would recommend to help. A PCP appointment was scheduled for 9/19 but the patient would like a response before that time if possible.

## 2017-10-22 ENCOUNTER — Ambulatory Visit: Payer: Medicare Other | Admitting: Internal Medicine

## 2017-11-03 DIAGNOSIS — M25511 Pain in right shoulder: Secondary | ICD-10-CM

## 2017-11-03 HISTORY — DX: Pain in right shoulder: M25.511

## 2017-11-11 ENCOUNTER — Encounter: Payer: Self-pay | Admitting: Internal Medicine

## 2017-11-11 ENCOUNTER — Ambulatory Visit (INDEPENDENT_AMBULATORY_CARE_PROVIDER_SITE_OTHER): Payer: Medicare Other | Admitting: Internal Medicine

## 2017-11-11 ENCOUNTER — Other Ambulatory Visit (INDEPENDENT_AMBULATORY_CARE_PROVIDER_SITE_OTHER): Payer: Medicare Other

## 2017-11-11 VITALS — BP 152/70 | HR 104 | Temp 98.1°F | Resp 16 | Ht 74.0 in | Wt 241.0 lb

## 2017-11-11 DIAGNOSIS — K769 Liver disease, unspecified: Secondary | ICD-10-CM

## 2017-11-11 DIAGNOSIS — K7030 Alcoholic cirrhosis of liver without ascites: Secondary | ICD-10-CM

## 2017-11-11 DIAGNOSIS — N410 Acute prostatitis: Secondary | ICD-10-CM | POA: Diagnosis not present

## 2017-11-11 DIAGNOSIS — I10 Essential (primary) hypertension: Secondary | ICD-10-CM | POA: Diagnosis not present

## 2017-11-11 DIAGNOSIS — R351 Nocturia: Secondary | ICD-10-CM | POA: Diagnosis not present

## 2017-11-11 DIAGNOSIS — K7689 Other specified diseases of liver: Secondary | ICD-10-CM

## 2017-11-11 DIAGNOSIS — Z794 Long term (current) use of insulin: Secondary | ICD-10-CM

## 2017-11-11 DIAGNOSIS — E118 Type 2 diabetes mellitus with unspecified complications: Secondary | ICD-10-CM

## 2017-11-11 DIAGNOSIS — R972 Elevated prostate specific antigen [PSA]: Secondary | ICD-10-CM

## 2017-11-11 DIAGNOSIS — E785 Hyperlipidemia, unspecified: Secondary | ICD-10-CM

## 2017-11-11 DIAGNOSIS — N401 Enlarged prostate with lower urinary tract symptoms: Secondary | ICD-10-CM

## 2017-11-11 DIAGNOSIS — Z23 Encounter for immunization: Secondary | ICD-10-CM | POA: Diagnosis not present

## 2017-11-11 DIAGNOSIS — Z0000 Encounter for general adult medical examination without abnormal findings: Secondary | ICD-10-CM

## 2017-11-11 LAB — COMPREHENSIVE METABOLIC PANEL
ALBUMIN: 4.6 g/dL (ref 3.5–5.2)
ALK PHOS: 76 U/L (ref 39–117)
ALT: 33 U/L (ref 0–53)
AST: 42 U/L — ABNORMAL HIGH (ref 0–37)
BILIRUBIN TOTAL: 2.1 mg/dL — AB (ref 0.2–1.2)
BUN: 15 mg/dL (ref 6–23)
CO2: 30 mEq/L (ref 19–32)
Calcium: 10.1 mg/dL (ref 8.4–10.5)
Chloride: 97 mEq/L (ref 96–112)
Creatinine, Ser: 0.92 mg/dL (ref 0.40–1.50)
GFR: 85.7 mL/min (ref >60.00)
Glucose, Bld: 149 mg/dL — ABNORMAL HIGH (ref 70–99)
POTASSIUM: 4.6 meq/L (ref 3.5–5.1)
Sodium: 139 mEq/L (ref 135–145)
TOTAL PROTEIN: 7.8 g/dL (ref 6.0–8.3)

## 2017-11-11 LAB — CBC WITH DIFFERENTIAL/PLATELET
Basophils Absolute: 0 10*3/uL (ref 0.0–0.1)
Basophils Relative: 0.2 % (ref 0.0–3.0)
EOS PCT: 0.9 % (ref 0.0–5.0)
Eosinophils Absolute: 0 10*3/uL (ref 0.0–0.7)
HEMATOCRIT: 51.8 % (ref 39.0–52.0)
LYMPHS PCT: 30.4 % (ref 12.0–46.0)
Lymphs Abs: 1.7 10*3/uL (ref 0.7–4.0)
MCHC: 34.8 g/dL (ref 30.0–36.0)
MCV: 100.4 fl — AB (ref 78.0–100.0)
Monocytes Absolute: 0.7 10*3/uL (ref 0.1–1.0)
Monocytes Relative: 12.3 % — ABNORMAL HIGH (ref 3.0–12.0)
Neutro Abs: 3.1 10*3/uL (ref 1.4–7.7)
Neutrophils Relative %: 56.2 % (ref 43.0–77.0)
Platelets: 68 10*3/uL — ABNORMAL LOW (ref 150.0–400.0)
RBC: 5.17 Mil/uL (ref 4.22–5.81)
RDW: 13.9 % (ref 11.5–15.5)
WBC: 5.5 10*3/uL (ref 4.0–10.5)

## 2017-11-11 LAB — POC URINALSYSI DIPSTICK (AUTOMATED)
BILIRUBIN UA: POSITIVE
GLUCOSE UA: POSITIVE — AB
Ketones, UA: POSITIVE
Leukocytes, UA: NEGATIVE
Nitrite, UA: NEGATIVE
Protein, UA: POSITIVE — AB
RBC UA: POSITIVE
Spec Grav, UA: 1.02 (ref 1.010–1.025)
Urobilinogen, UA: 1 E.U./dL
pH, UA: 6 (ref 5.0–8.0)

## 2017-11-11 LAB — PROTIME-INR
INR: 1 ratio (ref 0.8–1.0)
Prothrombin Time: 11.9 s (ref 9.6–13.1)

## 2017-11-11 LAB — LIPID PANEL
Cholesterol: 184 mg/dL (ref 0–200)
HDL: 105.4 mg/dL (ref >39.00)
LDL Cholesterol: 68 mg/dL (ref 0–99)
NONHDL: 78.22
Total CHOL/HDL Ratio: 2
Triglycerides: 50 mg/dL (ref 0.0–149.0)
VLDL: 10 mg/dL (ref 0.0–40.0)

## 2017-11-11 LAB — POCT GLYCOSYLATED HEMOGLOBIN (HGB A1C): Hemoglobin A1C: 5.6 % (ref 4.0–5.6)

## 2017-11-11 LAB — TSH: TSH: 1.63 u[IU]/mL (ref 0.35–4.50)

## 2017-11-11 LAB — PSA: PSA: 13.2

## 2017-11-11 LAB — POCT GLUCOSE (DEVICE FOR HOME USE): GLUCOSE FASTING, POC: 142 mg/dL — AB (ref 70–99)

## 2017-11-11 LAB — GAMMA GT: GGT: 534 U/L — AB (ref 7–51)

## 2017-11-11 MED ORDER — SULFAMETHOXAZOLE-TRIMETHOPRIM 800-160 MG PO TABS: 1.0000 | ORAL_TABLET | Freq: Two times a day (BID) | ORAL | 0 refills | Status: DC

## 2017-11-11 NOTE — Patient Instructions (Signed)

## 2017-11-11 NOTE — Progress Notes (Signed)
Subjective:  Patient ID: Patrick Kidd, male    DOB: 06-05-1945  Age: 72 y.o. MRN: [id number]  CC: Annual Exam and Hypertension   HPI Patrick Kidd presents for a CPX.  He is curious to know the status of his liver.  He has a history of alcoholic related hepatitis and cirrhosis.  He continues to drink at least 4 drinks a day.  He complains of fatigue but denies abdominal pain, nausea, vomiting, weight loss, or loss of appetite.  He complains of worsening nocturia.  He denies dysuria or hematuria.  Past Medical History:  Diagnosis Date  . Acrophobia   . Alcoholic cirrhosis ([place]) 3/81/7711  . Arthritis    foot by big toe  . Cataract    removed both eyes  . Chronic cough    PMH of  . Diabetes mellitus without complication ([place])   . Diverticulosis 07-03-2010   Colonoscopy.   . Duodenal ulcer hemorrhage   . GERD (gastroesophageal reflux disease)   . Gout   . Granuloma annulare   . Hx of adenomatous colonic polyps multiple  . Hydrocele   . Nonspecific elevation of levels of transaminase or lactic acid dehydrogenase (LDH)   . Peripheral neuropathy   . Plantar fasciitis    PMH of  . Sleep apnea    no cpap   Past Surgical History:  Procedure Laterality Date  . CATARACT EXTRACTION, BILATERAL  12/2011    Dr Gershon Crane  . COLONOSCOPY    . COLONOSCOPY W/ POLYPECTOMY  07/03/2010   2 adenomas, diverticulosis on right. Dr [name]  . ESOPHAGOGASTRODUODENOSCOPY (EGD) WITH PROPOFOL N/A 01/08/2016   Procedure: ESOPHAGOGASTRODUODENOSCOPY (EGD) WITH PROPOFOL;  Surgeon: [name], MD;  Location: [facility];  Service: Endoscopy;  Laterality: N/A;  . FLEXIBLE SIGMOIDOSCOPY  2000  . SIGMOIDOSCOPY    . WISDOM TOOTH EXTRACTION      reports that he quit smoking about 46 years ago. His smoking use included cigarettes. He has a 12.00 pack-year smoking history. He has never used smokeless tobacco. He reports that he drinks about 28.0 standard drinks of alcohol per week. He reports  that he does not use drugs. family history includes Leukemia in his father; Lung cancer in his mother. No Known Allergies  Outpatient Medications Prior to Visit  Medication Sig Dispense Refill  . Avanafil (STENDRA) 200 MG TABS Take 1 tablet by mouth daily as needed. 10 tablet 5  . Blood Glucose Monitoring Suppl (FREESTYLE FREEDOM LITE) w/Device KIT Use to check blood sugars 2 times daily 1 each 1  . gabapentin (NEURONTIN) 300 MG capsule Take one capsule four times a day as needed.     Marland Kitchen glucose blood (FREESTYLE LITE) test strip Use as TID 100 each 12  . metFORMIN (GLUCOPHAGE-XR) 500 MG 24 hr tablet Take 1 tablet (500 mg total) by mouth daily with breakfast. 90 tablet 3  . pantoprazole (PROTONIX) 40 MG tablet TAKE 1 TABLET(40 MG) BY MOUTH TWICE DAILY 180 tablet 0  . dapagliflozin propanediol (FARXIGA) 5 MG TABS tablet Take 5 mg by mouth daily. 90 tablet 1   No facility-administered medications prior to visit.     ROS Review of Systems  Constitutional: Positive for fatigue. Negative for appetite change, diaphoresis and unexpected weight change.  HENT: Negative.  Negative for trouble swallowing.   Eyes: Negative for visual disturbance.  Respiratory: Negative for cough, chest tightness, shortness of breath and wheezing.   Cardiovascular: Negative for chest pain, palpitations and leg swelling.  Gastrointestinal: Positive for abdominal distention. Negative for anal bleeding, constipation, diarrhea, nausea and vomiting.  Endocrine: Negative.   Genitourinary: Positive for difficulty urinating. Negative for decreased urine volume, discharge, dysuria, flank pain, frequency, hematuria, penile pain, penile swelling, scrotal swelling, testicular pain and urgency.  Musculoskeletal: Negative.   Skin: Negative.  Negative for color change.  Allergic/Immunologic: Negative.   Neurological: Negative.  Negative for dizziness, weakness and light-headedness.  Hematological: Negative for adenopathy. Does not  bruise/bleed easily.  Psychiatric/Behavioral: Negative.     Objective:  BP (!) 152/70 (BP Location: Left Arm, Patient Position: Sitting, Cuff Size: Large)   Pulse (!) 104   Temp 98.1 F (36.7 C) (Oral)   Resp 16   Ht 6' 2"  (1.88 m)   Wt 241 lb (109.3 kg)   SpO2 96%   BMI 30.94 kg/m   BP Readings from Last 3 Encounters:  11/11/17 (!) 152/70  10/08/17 (!) 146/72  08/17/17 (!) 150/62    Wt Readings from Last 3 Encounters:  11/11/17 241 lb (109.3 kg)  10/08/17 242 lb (109.8 kg)  08/17/17 242 lb (109.8 kg)    Physical Exam  Constitutional: He is oriented to person, place, and time. No distress.  HENT:  Mouth/Throat: Oropharynx is clear and moist. No oropharyngeal exudate.  Eyes: Right conjunctiva is injected. Right conjunctiva has no hemorrhage. Left conjunctiva is injected. Left conjunctiva has no hemorrhage. No scleral icterus.  Neck: Normal range of motion. Neck supple. No JVD present. No thyromegaly present.  Cardiovascular: Normal rate, regular rhythm and normal heart sounds. Exam reveals no gallop.  No murmur heard. EKG --  Sinus  Rhythm  -Nonspecific ST depression  -Nondiagnostic.   ABNORMAL - no change from the prior EKG   Pulmonary/Chest: Effort normal and breath sounds normal. No respiratory distress. He has no wheezes. He has no rhonchi. He has no rales.  Abdominal: Bowel sounds are normal. He exhibits distension. There is no tenderness. No hernia. Hernia confirmed negative in the right inguinal area and confirmed negative in the left inguinal area.  Genitourinary: Testes normal and penis normal. Rectal exam shows no external hemorrhoid, no internal hemorrhoid, no fissure, no mass, no tenderness, anal tone normal and guaiac negative stool. Prostate is enlarged (3++ BPH with bogginess). Prostate is not tender. Right testis shows no mass, no swelling and no tenderness. Left testis shows no swelling and no tenderness. Circumcised. No penile erythema or penile  tenderness. No discharge found.  Musculoskeletal: Normal range of motion. He exhibits no edema, tenderness or deformity.  Lymphadenopathy:    He has no cervical adenopathy. No inguinal adenopathy noted on the right or left side.  Neurological: He is alert and oriented to person, place, and time.  Skin: Skin is warm and dry. No rash noted. He is not diaphoretic.  Psychiatric: He has a normal mood and affect. His behavior is normal. Judgment and thought content normal. His mood appears not anxious. His speech is delayed and tangential. Cognition and memory are impaired. He does not exhibit a depressed mood. He exhibits abnormal recent memory and abnormal remote memory.  Vitals reviewed.   Lab Results  Component Value Date   WBC 5.5 11/11/2017   HGB 18.0 Repeated and verified X2. ([place]) 11/11/2017   HCT 51.8 11/11/2017   PLT 68.0 (L) 11/11/2017   GLUCOSE 149 (H) 11/11/2017   CHOL 184 11/11/2017   TRIG 50.0 11/11/2017   HDL 105.40 11/11/2017   LDLCALC 68 11/11/2017   ALT 33 11/11/2017   AST  42 (H) 11/11/2017   NA 139 11/11/2017   K 4.6 11/11/2017   CL 97 11/11/2017   CREATININE 0.92 11/11/2017   BUN 15 11/11/2017   CO2 30 11/11/2017   TSH 1.63 11/11/2017   PSA 13.2 11/11/2017   INR 1.0 11/11/2017   HGBA1C 5.6 11/11/2017   MICROALBUR 1.3 10/10/2016    Dg Finger Middle Left  Result Date: 10/28/2016 CLINICAL DATA:  Slammed middle finger in garage door 4 days ago. EXAM: LEFT MIDDLE FINGER 2+V COMPARISON:  None. FINDINGS: Soft tissue swelling and possible laceration over the tip of the left index finger. Soft tissue gas noted near the nail bed. No fracture, subluxation or dislocation. Joint spaces are maintained. IMPRESSION: No acute bony abnormality. Electronically Signed   By: Rolm Baptise M.D.   On: 10/28/2016 14:56   No results found for this or any previous visit (from the past 1 hour(s)).    Assessment & Plan:   Rosco was seen today for annual exam and  hypertension.  Diagnoses and all orders for this visit:  Type 2 diabetes mellitus with complication, with long-term current use of insulin ([place])- His A1c is down to 5.6%.  I have advised him to stop taking the SGLT2 inhibitor. -     Cancel: POCT UA - Microalbumin -     POCT glycosylated hemoglobin (Hb A1C) -     POCT Urinalysis Dipstick (Automated) -     POCT Glucose (Device for Home Use) -     Comprehensive metabolic panel; Future  Need for influenza vaccination -     Flu vaccine HIGH DOSE PF (Fluzone High dose)  Liver cyst- He has mildly abnormal LFTs.  His alpha-fetoprotein is normal.  I have asked him to go undergo an MRI to screen for hepatocellular carcinoma. -     MR LIVER W WO CONTRAST; Future -     Comprehensive metabolic panel; Future  Alcoholic cirrhosis of liver without ascites ([place])- His gamma GT is very high.  He has ongoing liver damage due to the alcohol abuse.  I have advised him that his liver disease is not getting any better, it is getting worse.  I have encouraged him to abstain from any further alcohol intake. -     MR LIVER W WO CONTRAST; Future -     Comprehensive metabolic panel; Future -     Protime-INR; Future -     Gamma GT; Future -     AFP tumor marker; Future -     Vitamin B1; Future  Liver disease -     MR LIVER W WO CONTRAST; Future -     Comprehensive metabolic panel; Future -     Protime-INR; Future -     Gamma GT; Future -     AFP tumor marker; Future  Hyperlipidemia LDL goal <100- He has achieved his LDL goal is doing well on the statin. -     Lipid panel; Future -     Comprehensive metabolic panel; Future  Essential hypertension- His blood pressure is not adequately well controlled.  He is clinically unstable at this time so will not add an antihypertensive pending further work-up. -     CBC with Differential/Platelet; Future -     TSH; Future -     EKG 12-Lead  BPH associated with nocturia -     Cancel: PSA; Future -     PSA, total  and free; Future -     CULTURE, URINE COMPREHENSIVE; Future  PSA elevation- His PSA is up to 13.2.  I have asked him to see urology to see if he needs to be evaluated for prostate cancer. -     PSA, total and free; Future -     Ambulatory referral to Urology  Acute prostatitis with hematuria -     sulfamethoxazole-trimethoprim (BACTRIM DS,SEPTRA DS) 800-160 MG tablet; Take 1 tablet by mouth 2 (two) times daily. -     CULTURE, URINE COMPREHENSIVE; Future  Medicare annual wellness visit, subsequent   I have discontinued Hollice Espy B. Ogarro "Bryan"'s dapagliflozin propanediol. I am also having him start on sulfamethoxazole-trimethoprim. Additionally, I am having him maintain his gabapentin, FREESTYLE FREEDOM LITE, glucose blood, pantoprazole, Avanafil, and metFORMIN.  Meds ordered this encounter  Medications  . sulfamethoxazole-trimethoprim (BACTRIM DS,SEPTRA DS) 800-160 MG tablet    Sig: Take 1 tablet by mouth 2 (two) times daily.    Dispense:  60 tablet    Refill:  0   See AVS for instructions about healthy living and anticipatory guidance.  Follow-up: Return in about 4 weeks (around 12/09/2017).  Scarlette Calico, MD

## 2017-11-12 NOTE — Assessment & Plan Note (Signed)

## 2017-11-14 LAB — CULTURE, URINE COMPREHENSIVE
MICRO NUMBER: 91214606
SPECIMEN QUALITY: ADEQUATE

## 2017-11-16 ENCOUNTER — Other Ambulatory Visit: Payer: Self-pay | Admitting: Internal Medicine

## 2017-11-16 ENCOUNTER — Encounter: Payer: Self-pay | Admitting: Internal Medicine

## 2017-11-16 DIAGNOSIS — E538 Deficiency of other specified B group vitamins: Secondary | ICD-10-CM | POA: Insufficient documentation

## 2017-11-16 DIAGNOSIS — F04 Amnestic disorder due to known physiological condition: Principal | ICD-10-CM

## 2017-11-16 LAB — PSA, TOTAL AND FREE
PSA, FREE: 1.2 ng/mL
PSA, TOTAL: 13.2 ng/mL — AB (ref <=4.0)

## 2017-11-16 LAB — VITAMIN B1: Vitamin B1 (Thiamine): <6 nmol/L — ABNORMAL LOW (ref 8–30)

## 2017-11-16 LAB — AFP TUMOR MARKER: AFP-Tumor Marker: 3.9 ng/mL (ref <6.1)

## 2017-11-16 MED ORDER — VITAMIN B-1 100 MG PO TABS: 100.0000 mg | ORAL_TABLET | Freq: Every day | ORAL | 1 refills | Status: DC

## 2017-11-19 ENCOUNTER — Ambulatory Visit: Payer: Self-pay

## 2017-11-19 NOTE — Telephone Encounter (Signed)
Patient called in with c/o "shoulder pain." He says "I rolled over on my right shoulder about 3 am and I've been in pain every since. I feel like it is a slight separation. The pain is a 6. I took Advil, but it didn't help." I asked about exercise, he says "I worked out last Friday, but my shoulder was not sore or anything." I asked about other symptoms, he denies. According to protocol, see PCP within 24 hours, advised I could schedule with Dr. Raeford Razor, who is sports medicine, he agrees. Appointment scheduled for tomorrow at 1340, care advice given, patient verbalized understanding.   Reason for Disposition . [1] Unable to use arm at all AND [2] because of shoulder pain or stiffness  Answer Assessment - Initial Assessment Questions 1. ONSET: "When did the pain start?"     About 3 am this morning 2. LOCATION: "Where is the pain located?"     Right shoulder 3. PAIN: "How bad is the pain?" (Scale 1-10; or mild, moderate, severe)   - MILD (1-3): doesn't interfere with normal activities   - MODERATE (4-7): interferes with normal activities (e.g., work or school) or awakens from sleep   - SEVERE (8-10): excruciating pain, unable to do any normal activities, unable to move arm at all due to pain     6 4. WORK OR EXERCISE: "Has there been any recent work or exercise that involved this part of the body?"     Last Friday worked out at [employer], no soreness. 5. CAUSE: "What do you think is causing the shoulder pain?"     It feels like a slight separation; turned over on the shoulder in the bed 6. OTHER SYMPTOMS: "Do you have any other symptoms?" (e.g., neck pain, swelling, rash, fever, numbness, weakness)     No 7. PREGNANCY: "Is there any chance you are pregnant?" "When was your last menstrual period?"     N/A  Protocols used: SHOULDER PAIN-A-AH

## 2017-11-20 ENCOUNTER — Ambulatory Visit: Payer: Self-pay

## 2017-11-20 ENCOUNTER — Ambulatory Visit (INDEPENDENT_AMBULATORY_CARE_PROVIDER_SITE_OTHER)
Admission: RE | Admit: 2017-11-20 | Discharge: 2017-11-20 | Disposition: A | Discharge status: Home / Self Care | Payer: Medicare Other | Source: Ambulatory Visit | Attending: Family Medicine | Admitting: Family Medicine

## 2017-11-20 ENCOUNTER — Ambulatory Visit (INDEPENDENT_AMBULATORY_CARE_PROVIDER_SITE_OTHER): Payer: Medicare Other | Admitting: Family Medicine

## 2017-11-20 VITALS — BP 132/72 | HR 85 | Ht 74.0 in | Wt 247.0 lb

## 2017-11-20 DIAGNOSIS — M25511 Pain in right shoulder: Secondary | ICD-10-CM | POA: Diagnosis not present

## 2017-11-20 DIAGNOSIS — S43004A Unspecified dislocation of right shoulder joint, initial encounter: Secondary | ICD-10-CM | POA: Diagnosis not present

## 2017-11-20 DIAGNOSIS — N401 Enlarged prostate with lower urinary tract symptoms: Secondary | ICD-10-CM | POA: Diagnosis not present

## 2017-11-20 DIAGNOSIS — R3915 Urgency of urination: Secondary | ICD-10-CM | POA: Diagnosis not present

## 2017-11-20 DIAGNOSIS — R972 Elevated prostate specific antigen [PSA]: Secondary | ICD-10-CM | POA: Diagnosis not present

## 2017-11-20 NOTE — Patient Instructions (Addendum)
Nice to meet you  Please try the exercise  Please try ice on the area  I will call you with the results from today  Please follow up with me in 3-4 weeks if your symptoms are not improving.

## 2017-11-20 NOTE — Progress Notes (Signed)
Patrick Kidd - 72 y.o. male MRN [id number]  Date of birth: 1945/05/08  SUBJECTIVE:  Including CC & ROS.  Chief Complaint  Patient presents with  . Shoulder Pain    Patrick Kidd is a 72 y.o. male that is here for right shoulder pain. Patient turned over in bed Wednesday morning and felt a pop. Sharp pain on superior shoulder. Pain lasted all day then that night around 5:00pm he felt a pop and then relief. Pain today over anterior shoulder and over posterior shoulder as well. Was unable to flexion shoulder yesterday but can today.    Review of Systems  Constitutional: Negative for fever.  HENT: Negative for congestion.   Respiratory: Negative for cough.   Cardiovascular: Negative for chest pain.  Gastrointestinal: Negative for abdominal pain.  Musculoskeletal: Negative for back pain.  Neurological: Negative for weakness.  Hematological: Negative for adenopathy.  Psychiatric/Behavioral: Negative for agitation.    HISTORY: Past Medical, Surgical, Social, and Family History Reviewed & Updated per EMR.   Pertinent Historical Findings include:  Past Medical History:  Diagnosis Date  . Acrophobia   . Alcoholic cirrhosis ([place]) 9/62/8366  . Arthritis    foot by big toe  . Cataract    removed both eyes  . Chronic cough    PMH of  . Diabetes mellitus without complication ([place])   . Diverticulosis 07-03-2010   Colonoscopy.   . Duodenal ulcer hemorrhage   . GERD (gastroesophageal reflux disease)   . Gout   . Granuloma annulare   . Hx of adenomatous colonic polyps multiple  . Hydrocele   . Nonspecific elevation of levels of transaminase or lactic acid dehydrogenase (LDH)   . Peripheral neuropathy   . Plantar fasciitis    PMH of  . Sleep apnea    no cpap    Past Surgical History:  Procedure Laterality Date  . CATARACT EXTRACTION, BILATERAL  12/2011    Dr Gershon Crane  . COLONOSCOPY    . COLONOSCOPY W/ POLYPECTOMY  07/03/2010   2 adenomas, diverticulosis on right. Dr  [name]  . ESOPHAGOGASTRODUODENOSCOPY (EGD) WITH PROPOFOL N/A 01/08/2016   Procedure: ESOPHAGOGASTRODUODENOSCOPY (EGD) WITH PROPOFOL;  Surgeon: [name], MD;  Location: [facility];  Service: Endoscopy;  Laterality: N/A;  . FLEXIBLE SIGMOIDOSCOPY  2000  . SIGMOIDOSCOPY    . WISDOM TOOTH EXTRACTION      No Known Allergies  Family History  Problem Relation Age of Onset  . Lung cancer Mother        smoker  . Leukemia Father        Acute myelocytic  . Diabetes Neg Hx   . Stroke Neg Hx   . Heart disease Neg Hx   . Colon cancer Neg Hx   . Colon polyps Neg Hx   . Esophageal cancer Neg Hx   . Rectal cancer Neg Hx   . Stomach cancer Neg Hx      Social History   Socioeconomic History  . Marital status: Married    Spouse name: Not on file  . Number of children: 2  . Years of education: Not on file  . Highest education level: Not on file  Occupational History  . Occupation: Adult nurse estate  Social Needs  . Financial resource strain: Not hard at all  . Food insecurity:    Worry: Never true    Inability: Never true  . Transportation needs:    Medical: No    Non-medical: No  Tobacco Use  .  Smoking status: Former Smoker    Packs/day: 2.00    Years: 6.00    Pack years: 12.00    Types: Cigarettes    Last attempt to quit: 02/04/1971    Years since quitting: 46.8  . Smokeless tobacco: Never Used  . Tobacco comment: smoked age [age], up to 2 ppd  Substance and Sexual Activity  . Alcohol use: Yes    Alcohol/week: 28.0 standard drinks    Types: 28 Shots of liquor per week  . Drug use: No  . Sexual activity: Yes    Partners: Female  Lifestyle  . Physical activity:    Days per week: 1 day    Minutes per session: 50 min  . Stress: Not at all  Relationships  . Social connections:    Talks on phone: More than three times a week    Gets together: More than three times a week    Attends religious service: Not on file    Active member of club or organization: Not  on file    Attends meetings of clubs or organizations: Not on file    Relationship status: Married  . Intimate partner violence:    Fear of current or ex partner: No    Emotionally abused: No    Physically abused: No    Forced sexual activity: No  Other Topics Concern  . Not on file  Social History Narrative   Fun/Hobby: Play golf, grandchildren, [employer]     PHYSICAL EXAM:  VS: BP 132/72   Pulse 85   Ht 6\' 2"  (1.88 m)   Wt 247 lb (112 kg)   SpO2 95%   BMI 31.71 kg/m  Physical Exam Gen: NAD, alert, cooperative with exam, well-appearing ENT: normal lips, normal nasal mucosa,  Eye: normal EOM, normal conjunctiva and lids CV:  no edema, +2 pedal pulses   Resp: no accessory muscle use, non-labored,  Skin: no rashes, no areas of induration  Neuro: normal tone, normal sensation to touch Psych:  normal insight, alert and oriented MSK:  Right shoulder: Inspection reveals no abnormalities, atrophy or asymmetry. Palpation is normal with no tenderness over AC joint or bicipital groove. ROM is full in all planes. Rotator cuff strength normal throughout.  negative  Hawkin's tests, empty can sign. Speeds tests normal.  negative Obrien's Neurovascularly intact   Limited ultrasound: right shoulder:  Normal-appearing biceps tendon. Normal-appearing subscapularis, supraspinatus, and infraspinatus. No joint effusion appreciated in the posterior glenohumeral joint.  Summary: Normal exam  Ultrasound and interpretation by Clearance Coots, MD       ASSESSMENT & PLAN:   Acute pain of right shoulder He reports a history of having somewhat of a dislocated shoulder but having it reduced on its own.  Has good range of motion with no effusion today. -X-ray. -Counseled on home exercise therapy and supportive care. -Pain was mild but could consider glenohumeral injection versus further imaging based on x-ray.

## 2017-11-22 DIAGNOSIS — M25511 Pain in right shoulder: Secondary | ICD-10-CM | POA: Insufficient documentation

## 2017-11-22 NOTE — Assessment & Plan Note (Signed)
He reports a history of having somewhat of a dislocated shoulder but having it reduced on its own.  Has good range of motion with no effusion today. -X-ray. -Counseled on home exercise therapy and supportive care. -Pain was mild but could consider glenohumeral injection versus further imaging based on x-ray.

## 2017-11-23 ENCOUNTER — Telehealth: Payer: Self-pay | Admitting: Family Medicine

## 2017-11-23 NOTE — Telephone Encounter (Signed)
Spoke with patient about results.   Rosemarie Ax, MD [facility] 11/23/2017, 1:57 PM

## 2017-11-24 ENCOUNTER — Ambulatory Visit
Admission: RE | Admit: 2017-11-24 | Discharge: 2017-11-24 | Disposition: A | Discharge status: Home / Self Care | Payer: Medicare Other | Source: Ambulatory Visit | Attending: Internal Medicine | Admitting: Internal Medicine

## 2017-11-24 DIAGNOSIS — K769 Liver disease, unspecified: Secondary | ICD-10-CM | POA: Diagnosis not present

## 2017-11-24 DIAGNOSIS — K7030 Alcoholic cirrhosis of liver without ascites: Secondary | ICD-10-CM

## 2017-11-24 DIAGNOSIS — K7689 Other specified diseases of liver: Secondary | ICD-10-CM

## 2017-11-24 MED ORDER — GADOXETATE DISODIUM 0.25 MMOL/ML IV SOLN
10.0000 mL | Freq: Once | INTRAVENOUS | Status: AC | PRN
  Administered 2017-11-24: 10 mL via INTRAVENOUS

## 2017-11-25 ENCOUNTER — Encounter: Payer: Self-pay | Admitting: Internal Medicine

## 2017-11-30 ENCOUNTER — Telehealth: Payer: Self-pay | Admitting: Internal Medicine

## 2017-11-30 NOTE — Telephone Encounter (Signed)
Copied from [place] [phone]. Topic: General - Other >> Nov 30, 2017  2:48 PM Cecelia Byars, NT wrote: Reason for CRM: Patient says he had a MRI done in 2017 and  also 2019 and would like for Dr Ronnald Ramp to compare the 2 and  see if there are any,changes good or bad please call him at  [phone]  to discuss

## 2017-12-01 NOTE — Telephone Encounter (Signed)
Pt informed of MRI results and recommendation to repeat MRI in 6 months.

## 2017-12-07 DIAGNOSIS — L920 Granuloma annulare: Secondary | ICD-10-CM | POA: Diagnosis not present

## 2017-12-09 ENCOUNTER — Telehealth: Payer: Self-pay

## 2017-12-09 NOTE — Telephone Encounter (Signed)
Key: [id number]  Response: The medication you have requested is not covered by Medicare Part D Law. If you believe the medication is being used for a medically accepted or compendia supported indication approved by CMS, please contact your patient's plan.

## 2017-12-10 DIAGNOSIS — C61 Malignant neoplasm of prostate: Secondary | ICD-10-CM | POA: Diagnosis not present

## 2017-12-10 DIAGNOSIS — R972 Elevated prostate specific antigen [PSA]: Secondary | ICD-10-CM | POA: Diagnosis not present

## 2017-12-10 NOTE — Telephone Encounter (Signed)
No alternative found

## 2017-12-10 NOTE — Telephone Encounter (Signed)
Patient called back to let Stefannie know that Dr. Ronnald Ramp has prescribed an alternative (could not remember name) but patient states it works great.

## 2017-12-10 NOTE — Telephone Encounter (Signed)
Left detailed message for pt of the PA denial. Requesting pt to let me know how to proceed, ie: altnernative to the stendra.

## 2017-12-18 ENCOUNTER — Telehealth: Payer: Self-pay

## 2017-12-18 NOTE — Telephone Encounter (Signed)
Copied from [place] [phone]. Topic: General - Other >> Dec 17, 2017  2:10 PM Lennox Solders wrote: Reason for CRM: pt dm meter is not longer working and he would like a free meter.

## 2017-12-21 MED ORDER — GLUCOSE BLOOD VI STRP: ORAL_STRIP | 12 refills | Status: DC

## 2017-12-21 MED ORDER — ACCU-CHEK MULTICLIX LANCETS MISC: 12 refills | Status: DC

## 2017-12-21 MED ORDER — BLOOD GLUCOSE MONITOR KIT: PACK | 0 refills | Status: DC

## 2017-12-24 DIAGNOSIS — C61 Malignant neoplasm of prostate: Secondary | ICD-10-CM | POA: Diagnosis not present

## 2018-01-08 NOTE — Progress Notes (Signed)
GU Location of Tumor / Histology: prostatic adenocarcinoma  If Prostate Cancer, Gleason Score is (3 + 4) and PSA is (13.2). Prostate volume: 47 grams.  HAIDAR MUSE is a previous patient of Dr. Ollen Barges. He was referred to Dr. Gloriann Loan in October 2019 for further evaluation of an elevated PSA.   Biopsies of prostate (if applicable) revealed:    Past/Anticipated interventions by urology, if any: prostate biopsy, referral for consideration of seeds and spaceoar  Past/Anticipated interventions by medical oncology, if any: no  Weight changes, if any: no  Bowel/Bladder complaints, if any: IPSS 6. SHIM 17. Denies dysuria or hematuria. Reports he continues to see blood in his semen. Denies urinary incontinence or leakage. Reports urinary urgency.    Nausea/Vomiting, if any: no  Pain issues, if any:  Strained back getting christmas tree into his house.  SAFETY ISSUES:  Prior radiation? no  Pacemaker/ICD? no  Possible current pregnancy? no  Is the patient on methotrexate? no  Current Complaints / other details:  72 year old male. Married. Reports his mother had lung ca and father had leukemia. Reports his sister is healthy.

## 2018-01-11 ENCOUNTER — Ambulatory Visit
Admission: RE | Admit: 2018-01-11 | Discharge: 2018-01-11 | Disposition: A | Discharge status: Home / Self Care | Payer: Medicare Other | Source: Ambulatory Visit | Attending: Radiation Oncology | Admitting: Radiation Oncology

## 2018-01-11 ENCOUNTER — Encounter: Payer: Self-pay | Admitting: Radiation Oncology

## 2018-01-11 ENCOUNTER — Other Ambulatory Visit: Payer: Self-pay

## 2018-01-11 ENCOUNTER — Encounter: Payer: Self-pay | Admitting: Medical Oncology

## 2018-01-11 VITALS — BP 140/79 | HR 82 | Temp 98.4°F | Resp 18 | Ht 74.0 in | Wt 245.5 lb

## 2018-01-11 DIAGNOSIS — E1140 Type 2 diabetes mellitus with diabetic neuropathy, unspecified: Secondary | ICD-10-CM | POA: Insufficient documentation

## 2018-01-11 DIAGNOSIS — Z7984 Long term (current) use of oral hypoglycemic drugs: Secondary | ICD-10-CM | POA: Diagnosis not present

## 2018-01-11 DIAGNOSIS — C61 Malignant neoplasm of prostate: Secondary | ICD-10-CM | POA: Insufficient documentation

## 2018-01-11 DIAGNOSIS — Z87891 Personal history of nicotine dependence: Secondary | ICD-10-CM | POA: Diagnosis not present

## 2018-01-11 DIAGNOSIS — R972 Elevated prostate specific antigen [PSA]: Secondary | ICD-10-CM | POA: Diagnosis not present

## 2018-01-11 HISTORY — DX: Malignant neoplasm of prostate: C61

## 2018-01-11 NOTE — Progress Notes (Signed)
See progress note under physician encounter. 

## 2018-01-11 NOTE — Progress Notes (Signed)
2 Radiation Oncology         [phone] ________________________________  Initial Outpatient Consultation  Name: Patrick Kidd MRN: [id number]  Date: 01/11/2018  DOB: 30-May-1945  MI:WOEHO, Arvid Right, MD  [name], MD   REFERRING PHYSICIAN: [name], MD  DIAGNOSIS: 72 y.o. gentleman with intermediate risk, Stage T1c adenocarcinoma of the prostate with Gleason Score of 3+4, and PSA of 13.2    ICD-10-CM   1. Malignant neoplasm of prostate ([place]) C61     HISTORY OF PRESENT ILLNESS: Patrick Kidd is a 72 y.o. male with a diagnosis of prostate cancer. He was noted to have an elevated PSA of 13.2 by his primary care physician, Dr. Scarlette Calico.  His previous PSA was 7.65 on 10/10/16.  Accordingly, he was referred for evaluation in urology.  He is a previous patient of Dr. Matilde Sprang, and was seen by Dr. Gloriann Loan on 11/20/17,  digital rectal examination was performed at that time revealing asymmetry on the left but no nodules.  The patient proceeded to transrectal ultrasound with 12 biopsies of the prostate on 12/10/17.  The prostate volume measured 47 cc.  Out of 12 core biopsies, 4 were positive.  The maximum Gleason score was 3+4, and this was seen in LML, LAL, LA, and RAL.  The patient reviewed the biopsy results with his urologist and he has kindly been referred today for discussion of potential radiation treatment options.    PREVIOUS RADIATION THERAPY: No  PAST MEDICAL HISTORY:  Past Medical History:  Diagnosis Date  . Acrophobia   . Alcoholic cirrhosis ([place]) 02/25/4823  . Arthritis    foot by big toe  . Cataract    removed both eyes  . Chronic cough    PMH of  . Diabetes mellitus without complication ([place])   . Diverticulosis 07-03-2010   Colonoscopy.   . Duodenal ulcer hemorrhage   . GERD (gastroesophageal reflux disease)   . Gout   . Granuloma annulare   . Hx of adenomatous colonic polyps multiple  . Hydrocele   . Nonspecific elevation of levels of  transaminase or lactic acid dehydrogenase (LDH)   . Peripheral neuropathy   . Plantar fasciitis    PMH of  . Prostate cancer ([place])   . Sleep apnea    no cpap      PAST SURGICAL HISTORY: Past Surgical History:  Procedure Laterality Date  . CATARACT EXTRACTION, BILATERAL  12/2011    Dr Gershon Crane  . COLONOSCOPY    . COLONOSCOPY W/ POLYPECTOMY  07/03/2010   2 adenomas, diverticulosis on right. Dr [name]  . ESOPHAGOGASTRODUODENOSCOPY (EGD) WITH PROPOFOL N/A 01/08/2016   Procedure: ESOPHAGOGASTRODUODENOSCOPY (EGD) WITH PROPOFOL;  Surgeon: [name], MD;  Location: [facility];  Service: Endoscopy;  Laterality: N/A;  . FLEXIBLE SIGMOIDOSCOPY  2000  . SIGMOIDOSCOPY    . WISDOM TOOTH EXTRACTION      FAMILY HISTORY:  Family History  Problem Relation Age of Onset  . Lung cancer Mother        smoker  . Leukemia Father        Acute myelocytic  . Diabetes Neg Hx   . Stroke Neg Hx   . Heart disease Neg Hx   . Colon cancer Neg Hx   . Colon polyps Neg Hx   . Esophageal cancer Neg Hx   . Rectal cancer Neg Hx   . Stomach cancer Neg Hx     SOCIAL HISTORY:  Social History  Socioeconomic History  . Marital status: Married    Spouse name: Not on file  . Number of children: 2  . Years of education: Not on file  . Highest education level: Not on file  Occupational History  . Occupation: Adult nurse estate  Social Needs  . Financial resource strain: Not hard at all  . Food insecurity:    Worry: Never true    Inability: Never true  . Transportation needs:    Medical: No    Non-medical: No  Tobacco Use  . Smoking status: Former Smoker    Packs/day: 2.00    Years: 6.00    Pack years: 12.00    Types: Cigarettes    Last attempt to quit: 02/04/1971    Years since quitting: 46.9  . Smokeless tobacco: Never Used  . Tobacco comment: smoked age [age], up to 2 ppd  Substance and Sexual Activity  . Alcohol use: Yes    Alcohol/week: 28.0 standard drinks    Types: 28 Shots of  liquor per week  . Drug use: No  . Sexual activity: Yes    Partners: Female  Lifestyle  . Physical activity:    Days per week: 1 day    Minutes per session: 50 min  . Stress: Not at all  Relationships  . Social connections:    Talks on phone: More than three times a week    Gets together: More than three times a week    Attends religious service: Not on file    Active member of club or organization: Not on file    Attends meetings of clubs or organizations: Not on file    Relationship status: Married  . Intimate partner violence:    Fear of current or ex partner: No    Emotionally abused: No    Physically abused: No    Forced sexual activity: No  Other Topics Concern  . Not on file  Social History Narrative   Fun/Hobby: Play golf, grandchildren, [employer]  The patient is married and is accompanied by his wife.  ALLERGIES: Patient has no known allergies.  MEDICATIONS:  Current Outpatient Medications  Medication Sig Dispense Refill  . blood glucose meter kit and supplies KIT Use to test blood sugar once daily. DX E11.8 1 each 0  . Blood Glucose Monitoring Suppl (FREESTYLE FREEDOM LITE) w/Device KIT Use to check blood sugars 2 times daily 1 each 1  . gabapentin (NEURONTIN) 300 MG capsule Take one capsule four times a day as needed.     Marland Kitchen glucose blood (COOL BLOOD GLUCOSE TEST STRIPS) test strip Use to check blood sugar daily. DX: E11.8 100 each 12  . Lancets (ACCU-CHEK MULTICLIX) lancets Use to check sugar daily. DX: E11.8 100 each 12  . metFORMIN (GLUCOPHAGE-XR) 500 MG 24 hr tablet Take 1 tablet (500 mg total) by mouth daily with breakfast. 90 tablet 3  . pantoprazole (PROTONIX) 40 MG tablet TAKE 1 TABLET(40 MG) BY MOUTH TWICE DAILY 180 tablet 0   No current facility-administered medications for this encounter.     REVIEW OF SYSTEMS:  On review of systems, the patient reports that he is doing well overall. He denies any chest pain, shortness of breath, cough, fevers, chills,  night sweats, unintended weight changes. He denies any bowel disturbances, and denies abdominal pain, nausea or vomiting. He reports recently straining his back. His IPSS was 6, indicating mild urinary symptoms. He reports urinary urgency and denies dysuria or hematuria and urinary incontinence or leakage.  He is able to complete sexual activity with some attempts. He reports continued blood in his semen. A complete review of systems is obtained and is otherwise negative.    PHYSICAL EXAM:  Wt Readings from Last 3 Encounters:  01/11/18 245 lb 8 oz (111.4 kg)  11/20/17 247 lb (112 kg)  11/11/17 241 lb (109.3 kg)   Temp Readings from Last 3 Encounters:  01/11/18 98.4 F (36.9 C) (Oral)  11/11/17 98.1 F (36.7 C) (Oral)  03/05/17 98.1 F (36.7 C)   BP Readings from Last 3 Encounters:  01/11/18 140/79  11/20/17 132/72  11/11/17 (!) 152/70   Pulse Readings from Last 3 Encounters:  01/11/18 82  11/20/17 85  11/11/17 (!) 104   Pain Assessment Pain Score: 0-No pain/10  In general this is a well appearing caucasian male in no acute distress. He's alert and oriented x4 and appropriate throughout the examination. Cardiopulmonary assessment is negative for acute distress and he exhibits normal effort.     KPS = 100  100 - Normal; no complaints; no evidence of disease. 90   - Able to carry on normal activity; minor signs or symptoms of disease. 80   - Normal activity with effort; some signs or symptoms of disease. 16   - Cares for self; unable to carry on normal activity or to do active work. 60   - Requires occasional assistance, but is able to care for most of his personal needs. 50   - Requires considerable assistance and frequent medical care. 2   - Disabled; requires special care and assistance. 21   - Severely disabled; hospital admission is indicated although death not imminent. 54   - Very sick; hospital admission necessary; active supportive treatment necessary. 10   -  Moribund; fatal processes progressing rapidly. 0     - Dead  Karnofsky DA, Abelmann WH, Craver LS and Burchenal [facility] [phone]) The use of the nitrogen mustards in the palliative treatment of carcinoma: with particular reference to bronchogenic carcinoma Cancer 1 634-56  LABORATORY DATA:  Lab Results  Component Value Date   WBC 5.5 11/11/2017   HGB 18.0 Repeated and verified X2. ([place]) 11/11/2017   HCT 51.8 11/11/2017   MCV 100.4 (H) 11/11/2017   PLT 68.0 (L) 11/11/2017   Lab Results  Component Value Date   NA 139 11/11/2017   K 4.6 11/11/2017   CL 97 11/11/2017   CO2 30 11/11/2017   Lab Results  Component Value Date   ALT 33 11/11/2017   AST 42 (H) 11/11/2017   ALKPHOS 76 11/11/2017   BILITOT 2.1 (H) 11/11/2017     RADIOGRAPHY: No results found.    IMPRESSION/PLAN: 1. 72 y.o. gentleman with favorable intermediate risk, Stage T1c adenocarcinoma of the prostate with Gleason Score of 3+4, and PSA of 13.2. We discussed the patient's workup and outlines the nature of prostate cancer in this setting. The patient's T stage, Gleason's score, and PSA put him into the favorable intermediate risk group. Accordingly, he is eligible for a variety of potential treatment options including brachytherapy or 5-8 weeks of external radiation. He is not an ideal candidate for prostatectomy given his age. We discussed the available radiation techniques, and focused on the details and logistics and delivery. We discussed and outlined the risks, benefits, short and long-term effects associated with radiotherapy and compared and contrasted these with prostatectomy. We discussed the role of [name] in reducing the rectal toxicity associated with radiotherapy.  At the end of our  discussion, the patient decided to proceed with brachytherapy and use of [name] to reduce rectal toxicity from radiotherapy.  We will share our discussion with Dr. Gloriann Loan and move forward with scheduling his CT [facility] planning appointment in the  near future.  The patient met briefly with Romie Jumper in our office who will be working closely with him to coordinate OR scheduling and pre and post procedure appointments.  We will contact the pharmaceutical rep to ensure that Anderson is available at the time of procedure.  He will have a prostate MRI following his post-seed CT SIM to confirm appropriate distribution of the [place].  In a visit lasting 60 minutes, greater than 50% of the time was spent face to face discussing his case, and coordinating the patient's care.     Carola Rhine, PAC    Tyler Pita, MD  [facility] Direct Dial: [phone]  Fax: [phone] [email or web address]  Skype  LinkedIn    This document serves as a record of services personally performed by Shona Simpson, PA-C and Dr. Tammi Klippel. It was created on their behalf by Wilburn Mylar, a trained medical scribe. The creation of this record is based on the scribe's personal observations and the provider's statements to them. This document has been checked and approved by the attending provider.

## 2018-01-13 DIAGNOSIS — C61 Malignant neoplasm of prostate: Secondary | ICD-10-CM | POA: Insufficient documentation

## 2018-01-15 ENCOUNTER — Telehealth: Payer: Self-pay | Admitting: *Deleted

## 2018-01-15 NOTE — Telephone Encounter (Signed)
CALLED PATIENT TO INFORM OF PRE-SEED PLANNING CT FOR 02-11-18, SPOKE WITH PATIENT AND HE IS AWARE OF THIS DATE

## 2018-01-18 ENCOUNTER — Other Ambulatory Visit: Payer: Self-pay | Admitting: Urology

## 2018-01-18 ENCOUNTER — Telehealth: Payer: Self-pay | Admitting: *Deleted

## 2018-01-18 NOTE — Telephone Encounter (Signed)
Called patient to inform of implant date, spoke with patient and he is aware of this procedure °

## 2018-02-09 DIAGNOSIS — C61 Malignant neoplasm of prostate: Secondary | ICD-10-CM | POA: Diagnosis not present

## 2018-02-10 ENCOUNTER — Telehealth: Payer: Self-pay | Admitting: *Deleted

## 2018-02-10 NOTE — Telephone Encounter (Signed)
CALLED PATIENT TO INFORM OF PRE-SEED APPTS. AND CHEST X-RAY AND EKG BEING MOVED TO 02-19-18, SPOKE WITH PATIENT AND HE IS AWARE OF THESE CHANGES AND IS GOOD WITH

## 2018-02-11 ENCOUNTER — Encounter (HOSPITAL_COMMUNITY): Payer: Medicare Other

## 2018-02-11 ENCOUNTER — Ambulatory Visit: Payer: Medicare Other | Admitting: Radiation Oncology

## 2018-02-11 ENCOUNTER — Other Ambulatory Visit (HOSPITAL_COMMUNITY): Payer: Medicare Other

## 2018-02-18 ENCOUNTER — Telehealth: Payer: Self-pay | Admitting: *Deleted

## 2018-02-18 ENCOUNTER — Other Ambulatory Visit: Payer: Self-pay | Admitting: Urology

## 2018-02-18 DIAGNOSIS — C61 Malignant neoplasm of prostate: Secondary | ICD-10-CM

## 2018-02-18 NOTE — Telephone Encounter (Signed)
CALLED PATIENT TO REMIND OF PRE-SEED APPTS. AND CHEST X-RAY AND EKG FOR 02-19-18, SPOKE WITH PATIENT AND HE IS AWARE OF THESE APPTS.

## 2018-02-18 NOTE — Telephone Encounter (Signed)
XXXX 

## 2018-02-19 ENCOUNTER — Ambulatory Visit: Payer: Medicare Other | Admitting: Radiation Oncology

## 2018-02-19 ENCOUNTER — Ambulatory Visit
Admission: RE | Admit: 2018-02-19 | Discharge: 2018-02-19 | Disposition: A | Discharge status: Home / Self Care | Payer: Medicare Other | Source: Ambulatory Visit | Attending: Radiation Oncology | Admitting: Radiation Oncology

## 2018-02-19 ENCOUNTER — Encounter (HOSPITAL_COMMUNITY)
Admission: RE | Admit: 2018-02-19 | Discharge: 2018-02-19 | Disposition: A | Discharge status: Home / Self Care | Payer: Medicare Other | Source: Ambulatory Visit | Attending: Urology | Admitting: Urology

## 2018-02-19 ENCOUNTER — Ambulatory Visit (HOSPITAL_COMMUNITY)
Admission: RE | Admit: 2018-02-19 | Discharge: 2018-02-19 | Disposition: A | Discharge status: Home / Self Care | Payer: Medicare Other | Source: Ambulatory Visit | Attending: Urology | Admitting: Urology

## 2018-02-19 ENCOUNTER — Ambulatory Visit
Admission: RE | Admit: 2018-02-19 | Discharge: 2018-02-19 | Disposition: A | Discharge status: Home / Self Care | Payer: Medicare Other | Source: Ambulatory Visit | Attending: Urology | Admitting: Urology

## 2018-02-19 ENCOUNTER — Encounter: Payer: Self-pay | Admitting: Medical Oncology

## 2018-02-19 VITALS — BP 144/72 | HR 94 | Temp 98.3°F | Resp 18 | Wt 243.0 lb

## 2018-02-19 DIAGNOSIS — C61 Malignant neoplasm of prostate: Secondary | ICD-10-CM | POA: Diagnosis not present

## 2018-02-19 DIAGNOSIS — Z510 Encounter for antineoplastic radiation therapy: Secondary | ICD-10-CM | POA: Diagnosis not present

## 2018-02-19 DIAGNOSIS — J9811 Atelectasis: Secondary | ICD-10-CM | POA: Diagnosis not present

## 2018-02-19 NOTE — Progress Notes (Signed)
  Radiation Oncology         [phone] ________________________________  Name: Patrick Kidd MRN: [id number]  Date: 02/19/2018  DOB: Jun 14, 1945  SIMULATION AND TREATMENT PLANNING NOTE PUBIC ARCH STUDY  EL:TRVUY, Arvid Right, MD  Marton Redwood III, MD  DIAGNOSIS: 73 y.o. gentleman with intermediate risk, Stage T1c adenocarcinoma of the prostate with Gleason Score of 3+4, and PSA of 13.2     ICD-10-CM   1. Malignant neoplasm of prostate ([place]) C61     COMPLEX SIMULATION:  The patient presented today for evaluation for possible prostate seed implant. He was brought to the radiation planning suite and placed supine on the CT couch. A 3-dimensional image study set was obtained in upload to the planning computer. There, on each axial slice, I contoured the prostate gland. Then, using three-dimensional radiation planning tools I reconstructed the prostate in view of the structures from the transperineal needle pathway to assess for possible pubic arch interference. In doing so, I did not appreciate any pubic arch interference. Also, the patient's prostate volume was estimated based on the drawn structure. The volume was 47 cc.  Given the pubic arch appearance and prostate volume, patient remains a good candidate to proceed with prostate seed implant. Today, he freely provided informed written consent to proceed.    PLAN: The patient will undergo prostate seed implant.   ________________________________  Sheral Apley. Tammi Klippel, M.D.   This document serves as a record of services personally performed by Tyler Pita, MD. It was created on his behalf by Wilburn Mylar, a trained medical scribe. The creation of this record is based on the scribe's personal observations and the provider's statements to them. This document has been checked and approved by the attending provider.

## 2018-02-22 ENCOUNTER — Telehealth: Payer: Self-pay | Admitting: *Deleted

## 2018-02-22 NOTE — Telephone Encounter (Signed)
RETURNED PATIENT'S PHONE CALL, SPOKE WITH PATIENT. ?

## 2018-02-24 ENCOUNTER — Ambulatory Visit: Payer: Medicare Other | Admitting: Radiation Oncology

## 2018-03-05 ENCOUNTER — Telehealth: Payer: Self-pay | Admitting: *Deleted

## 2018-03-05 NOTE — Telephone Encounter (Signed)
CALLED PATIENT TO REMIND OF LAB APPT. FOR 03-08-18, ARRIVAL TIME- 10:45 AM @ [facility] AD[facility]ITTING, LVM FOR A RETURN CALL

## 2018-03-08 ENCOUNTER — Encounter (HOSPITAL_COMMUNITY)
Admission: RE | Admit: 2018-03-08 | Discharge: 2018-03-08 | Disposition: A | Discharge status: Home / Self Care | Payer: Medicare Other | Source: Ambulatory Visit | Attending: Urology | Admitting: Urology

## 2018-03-08 DIAGNOSIS — C61 Malignant neoplasm of prostate: Secondary | ICD-10-CM | POA: Insufficient documentation

## 2018-03-08 DIAGNOSIS — Z01818 Encounter for other preprocedural examination: Secondary | ICD-10-CM | POA: Insufficient documentation

## 2018-03-08 LAB — COMPREHENSIVE METABOLIC PANEL
ALBUMIN: 4.5 g/dL (ref 3.5–5.0)
ALK PHOS: 61 U/L (ref 38–126)
ALT: 17 U/L (ref 0–44)
ANION GAP: 12 (ref 5–15)
AST: 26 U/L (ref 15–41)
BILIRUBIN TOTAL: 2.1 mg/dL — AB (ref 0.3–1.2)
BUN: 13 mg/dL (ref 8–23)
CO2: 25 mmol/L (ref 22–32)
CREATININE: 0.89 mg/dL (ref 0.61–1.24)
Calcium: 9.2 mg/dL (ref 8.9–10.3)
Chloride: 100 mmol/L (ref 98–111)
GFR calc Af Amer: >60 mL/min (ref >60)
GFR calc non Af Amer: >60 mL/min (ref >60)
GLUCOSE: 215 mg/dL — AB (ref 70–99)
Potassium: 4.5 mmol/L (ref 3.5–5.1)
SODIUM: 137 mmol/L (ref 135–145)
TOTAL PROTEIN: 7.8 g/dL (ref 6.5–8.1)

## 2018-03-08 LAB — CBC
HEMATOCRIT: 48.1 % (ref 39.0–52.0)
HEMOGLOBIN: 17.1 g/dL — AB (ref 13.0–17.0)
MCH: 34.8 pg — ABNORMAL HIGH (ref 26.0–34.0)
MCHC: 35.6 g/dL (ref 30.0–36.0)
MCV: 97.8 fL (ref 80.0–100.0)
Platelets: 83 10*3/uL — ABNORMAL LOW (ref 150–400)
RBC: 4.92 MIL/uL (ref 4.22–5.81)
RDW: 13.6 % (ref 11.5–15.5)
WBC: 5.6 10*3/uL (ref 4.0–10.5)
nRBC: 0 % (ref 0.0–0.2)

## 2018-03-08 LAB — PROTIME-INR
INR: 0.98
PROTHROMBIN TIME: 12.9 s (ref 11.4–15.2)

## 2018-03-08 LAB — APTT: APTT: 26 s (ref 24–36)

## 2018-03-08 NOTE — Progress Notes (Signed)
CMP result 03-08-2018 routed to dr bell in epic.

## 2018-03-09 ENCOUNTER — Encounter (HOSPITAL_BASED_OUTPATIENT_CLINIC_OR_DEPARTMENT_OTHER): Payer: Self-pay

## 2018-03-09 ENCOUNTER — Other Ambulatory Visit: Payer: Self-pay

## 2018-03-09 NOTE — Progress Notes (Signed)
Spoke with: "Bryan" NPO:  No food after midnight/Clear liquids until 7:00AM DOS Arrival time:  1100AM Labs: EKG,CXR, CBC,CMP, PT,PTT in epic/chart AM medications: Pantoprazole, Gabapentin, Fleet enema Pre op orders: Yes Ride home:  [name] (wife) [phone]

## 2018-03-12 ENCOUNTER — Telehealth: Payer: Self-pay | Admitting: *Deleted

## 2018-03-12 NOTE — Telephone Encounter (Signed)
CALLED PATIENT TO REMIND OF PROCEDURE FOR 03-15-18, LVM FOR A RETURN CALL

## 2018-03-15 ENCOUNTER — Encounter (HOSPITAL_BASED_OUTPATIENT_CLINIC_OR_DEPARTMENT_OTHER): Payer: Self-pay | Admitting: *Deleted

## 2018-03-15 ENCOUNTER — Ambulatory Visit (HOSPITAL_COMMUNITY): Payer: Medicare Other

## 2018-03-15 ENCOUNTER — Ambulatory Visit (HOSPITAL_BASED_OUTPATIENT_CLINIC_OR_DEPARTMENT_OTHER): Payer: Medicare Other | Admitting: Anesthesiology

## 2018-03-15 ENCOUNTER — Ambulatory Visit (HOSPITAL_BASED_OUTPATIENT_CLINIC_OR_DEPARTMENT_OTHER)
Admission: RE | Admit: 2018-03-15 | Discharge: 2018-03-15 | Disposition: A | Discharge status: Home / Self Care | Payer: Medicare Other | Attending: Urology | Admitting: Urology

## 2018-03-15 ENCOUNTER — Other Ambulatory Visit: Payer: Self-pay

## 2018-03-15 ENCOUNTER — Encounter (HOSPITAL_BASED_OUTPATIENT_CLINIC_OR_DEPARTMENT_OTHER)
Admission: RE | Disposition: A | Discharge status: Home / Self Care | Payer: Self-pay | Source: Home / Self Care | Attending: Urology

## 2018-03-15 DIAGNOSIS — G4730 Sleep apnea, unspecified: Secondary | ICD-10-CM | POA: Diagnosis not present

## 2018-03-15 DIAGNOSIS — C61 Malignant neoplasm of prostate: Secondary | ICD-10-CM | POA: Insufficient documentation

## 2018-03-15 DIAGNOSIS — Z79899 Other long term (current) drug therapy: Secondary | ICD-10-CM | POA: Insufficient documentation

## 2018-03-15 DIAGNOSIS — N529 Male erectile dysfunction, unspecified: Secondary | ICD-10-CM | POA: Insufficient documentation

## 2018-03-15 DIAGNOSIS — E119 Type 2 diabetes mellitus without complications: Secondary | ICD-10-CM | POA: Insufficient documentation

## 2018-03-15 DIAGNOSIS — Z87891 Personal history of nicotine dependence: Secondary | ICD-10-CM | POA: Diagnosis not present

## 2018-03-15 DIAGNOSIS — I10 Essential (primary) hypertension: Secondary | ICD-10-CM | POA: Diagnosis not present

## 2018-03-15 DIAGNOSIS — Z7984 Long term (current) use of oral hypoglycemic drugs: Secondary | ICD-10-CM | POA: Insufficient documentation

## 2018-03-15 DIAGNOSIS — K7460 Unspecified cirrhosis of liver: Secondary | ICD-10-CM | POA: Insufficient documentation

## 2018-03-15 DIAGNOSIS — E785 Hyperlipidemia, unspecified: Secondary | ICD-10-CM | POA: Diagnosis not present

## 2018-03-15 DIAGNOSIS — K219 Gastro-esophageal reflux disease without esophagitis: Secondary | ICD-10-CM | POA: Diagnosis not present

## 2018-03-15 DIAGNOSIS — D649 Anemia, unspecified: Secondary | ICD-10-CM | POA: Diagnosis not present

## 2018-03-15 HISTORY — PX: SPACE OAR INSTILLATION: SHX6769

## 2018-03-15 HISTORY — DX: Anemia, unspecified: D64.9

## 2018-03-15 HISTORY — PX: CYSTOSCOPY: SHX5120

## 2018-03-15 HISTORY — DX: Flat foot (pes planus) (acquired), right foot: M21.41

## 2018-03-15 HISTORY — DX: Presence of spectacles and contact lenses: Z97.3

## 2018-03-15 HISTORY — DX: Obesity, unspecified: E66.9

## 2018-03-15 HISTORY — DX: Liver disease, unspecified: K76.9

## 2018-03-15 HISTORY — DX: Flat foot (pes planus) (acquired), right foot: M21.42

## 2018-03-15 HISTORY — DX: Duodenal ulcer, unspecified as acute or chronic, without hemorrhage or perforation: K26.9

## 2018-03-15 HISTORY — DX: Other specified diseases of liver: K76.89

## 2018-03-15 HISTORY — DX: Benign prostatic hyperplasia with lower urinary tract symptoms: N40.1

## 2018-03-15 HISTORY — PX: RADIOACTIVE SEED IMPLANT: SHX5150

## 2018-03-15 HISTORY — DX: Portal hypertension: K76.6

## 2018-03-15 HISTORY — DX: Nocturia: R35.1

## 2018-03-15 HISTORY — DX: Pain in right shoulder: M25.511

## 2018-03-15 LAB — GLUCOSE, CAPILLARY
GLUCOSE-CAPILLARY: 203 mg/dL — AB (ref 70–99)
GLUCOSE-CAPILLARY: 188 mg/dL — AB (ref 70–99)

## 2018-03-15 SURGERY — INSERTION, RADIATION SOURCE, PROSTATE
Anesthesia: General | Site: Prostate

## 2018-03-15 MED ORDER — SODIUM CHLORIDE 0.9 % IR SOLN
Status: DC | PRN
  Administered 2018-03-15: 1000 mL

## 2018-03-15 MED ORDER — FENTANYL CITRATE (PF) 100 MCG/2ML IJ SOLN
INTRAMUSCULAR | Status: AC
  Filled 2018-03-15: qty 2

## 2018-03-15 MED ORDER — DEXAMETHASONE SODIUM PHOSPHATE 4 MG/ML IJ SOLN
INTRAMUSCULAR | Status: DC | PRN
  Administered 2018-03-15: 10 mg via INTRAVENOUS

## 2018-03-15 MED ORDER — CIPROFLOXACIN IN D5W 400 MG/200ML IV SOLN
400.0000 mg | INTRAVENOUS | Status: AC
  Administered 2018-03-15: 400 mg via INTRAVENOUS
  Filled 2018-03-15: qty 200

## 2018-03-15 MED ORDER — PROPOFOL 10 MG/ML IV BOLUS
INTRAVENOUS | Status: AC
  Filled 2018-03-15: qty 20

## 2018-03-15 MED ORDER — ONDANSETRON HCL 4 MG/2ML IJ SOLN
INTRAMUSCULAR | Status: DC | PRN
  Administered 2018-03-15: 4 mg via INTRAVENOUS

## 2018-03-15 MED ORDER — HYDROMORPHONE HCL 1 MG/ML IJ SOLN
0.2500 mg | INTRAMUSCULAR | Status: DC | PRN
  Filled 2018-03-15: qty 0.5

## 2018-03-15 MED ORDER — LIDOCAINE 2% (20 MG/ML) 5 ML SYRINGE
INTRAMUSCULAR | Status: AC
  Filled 2018-03-15: qty 5

## 2018-03-15 MED ORDER — FLEET ENEMA 7-19 GM/118ML RE ENEM
1.0000 | ENEMA | Freq: Once | RECTAL | Status: AC
  Administered 2018-03-15: 1 via RECTAL
  Filled 2018-03-15: qty 1

## 2018-03-15 MED ORDER — PROMETHAZINE HCL 25 MG/ML IJ SOLN
6.2500 mg | INTRAMUSCULAR | Status: DC | PRN
  Filled 2018-03-15: qty 1

## 2018-03-15 MED ORDER — OXYCODONE HCL 5 MG PO TABS
5.0000 mg | ORAL_TABLET | Freq: Once | ORAL | Status: DC | PRN
  Filled 2018-03-15: qty 1

## 2018-03-15 MED ORDER — ONDANSETRON HCL 4 MG/2ML IJ SOLN
INTRAMUSCULAR | Status: AC
  Filled 2018-03-15: qty 2

## 2018-03-15 MED ORDER — FENTANYL CITRATE (PF) 100 MCG/2ML IJ SOLN
INTRAMUSCULAR | Status: DC | PRN
  Administered 2018-03-15 (×4): 25 ug via INTRAVENOUS
  Administered 2018-03-15: 100 ug via INTRAVENOUS

## 2018-03-15 MED ORDER — SODIUM CHLORIDE FLUSH 0.9 % IV SOLN
INTRAVENOUS | Status: DC | PRN
  Administered 2018-03-15: 10 mL via INTRAVENOUS

## 2018-03-15 MED ORDER — CIPROFLOXACIN IN D5W 400 MG/200ML IV SOLN
INTRAVENOUS | Status: AC
  Filled 2018-03-15: qty 200

## 2018-03-15 MED ORDER — HYDROCODONE-ACETAMINOPHEN 5-325 MG PO TABS: 1.0000 | ORAL_TABLET | ORAL | 0 refills | Status: DC | PRN

## 2018-03-15 MED ORDER — LIDOCAINE 2% (20 MG/ML) 5 ML SYRINGE
INTRAMUSCULAR | Status: DC | PRN
  Administered 2018-03-15: 100 mg via INTRAVENOUS

## 2018-03-15 MED ORDER — LACTATED RINGERS IV SOLN
INTRAVENOUS | Status: DC
  Administered 2018-03-15 (×2): via INTRAVENOUS
  Filled 2018-03-15: qty 1000

## 2018-03-15 MED ORDER — PROPOFOL 10 MG/ML IV BOLUS
INTRAVENOUS | Status: DC | PRN
  Administered 2018-03-15: 200 mg via INTRAVENOUS

## 2018-03-15 MED ORDER — OXYCODONE HCL 5 MG/5ML PO SOLN
5.0000 mg | Freq: Once | ORAL | Status: DC | PRN
  Filled 2018-03-15: qty 5

## 2018-03-15 MED ORDER — MEPERIDINE HCL 25 MG/ML IJ SOLN
6.2500 mg | INTRAMUSCULAR | Status: DC | PRN
  Filled 2018-03-15: qty 1

## 2018-03-15 MED ORDER — IOHEXOL 300 MG/ML  SOLN
INTRAMUSCULAR | Status: DC | PRN
  Administered 2018-03-15: 7 mL

## 2018-03-15 MED ORDER — DEXAMETHASONE SODIUM PHOSPHATE 10 MG/ML IJ SOLN
INTRAMUSCULAR | Status: AC
  Filled 2018-03-15: qty 1

## 2018-03-15 SURGICAL SUPPLY — 45 items
BAG URINE DRAINAGE (UROLOGICAL SUPPLIES) ×3 IMPLANT
BLADE CLIPPER SURG (BLADE) ×3 IMPLANT
CATH FOLEY 2WAY SLVR  5CC 16FR (CATHETERS) ×1
CATH FOLEY 2WAY SLVR 5CC 16FR (CATHETERS) ×2 IMPLANT
CATH ROBINSON RED A/P 16FR (CATHETERS) IMPLANT
CATH ROBINSON RED A/P 20FR (CATHETERS) ×3 IMPLANT
CLOTH BEACON ORANGE TIMEOUT ST (SAFETY) ×3 IMPLANT
CONT SPECI 4OZ STER CLIK (MISCELLANEOUS) ×6 IMPLANT
COVER BACK TABLE 60X90IN (DRAPES) ×3 IMPLANT
COVER MAYO STAND STRL (DRAPES) ×3 IMPLANT
COVER WAND RF STERILE (DRAPES) ×3 IMPLANT
DRSG TEGADERM 4X4.75 (GAUZE/BANDAGES/DRESSINGS) ×3 IMPLANT
DRSG TEGADERM 8X12 (GAUZE/BANDAGES/DRESSINGS) ×6 IMPLANT
GAUZE SPONGE 4X4 12PLY STRL (GAUZE/BANDAGES/DRESSINGS) ×3 IMPLANT
GLOVE BIO SURGEON STRL SZ 6 (GLOVE) IMPLANT
GLOVE BIO SURGEON STRL SZ 6.5 (GLOVE) IMPLANT
GLOVE BIO SURGEON STRL SZ7 (GLOVE) ×3 IMPLANT
GLOVE BIO SURGEON STRL SZ7.5 (GLOVE) ×6 IMPLANT
GLOVE BIO SURGEON STRL SZ8 (GLOVE) IMPLANT
GLOVE BIOGEL PI IND STRL 6 (GLOVE) IMPLANT
GLOVE BIOGEL PI IND STRL 6.5 (GLOVE) IMPLANT
GLOVE BIOGEL PI IND STRL 7.0 (GLOVE) IMPLANT
GLOVE BIOGEL PI IND STRL 7.5 (GLOVE) ×4 IMPLANT
GLOVE BIOGEL PI IND STRL 8 (GLOVE) IMPLANT
GLOVE BIOGEL PI INDICATOR 6 (GLOVE)
GLOVE BIOGEL PI INDICATOR 6.5 (GLOVE)
GLOVE BIOGEL PI INDICATOR 7.0 (GLOVE)
GLOVE BIOGEL PI INDICATOR 7.5 (GLOVE) ×2
GLOVE BIOGEL PI INDICATOR 8 (GLOVE)
GLOVE ECLIPSE 8.0 STRL XLNG CF (GLOVE) ×3 IMPLANT
GOWN STRL REUS W/TWL LRG LVL3 (GOWN DISPOSABLE) ×3 IMPLANT
GOWN STRL REUS W/TWL XL LVL3 (GOWN DISPOSABLE) ×3 IMPLANT
HOLDER FOLEY CATH W/STRAP (MISCELLANEOUS) IMPLANT
IMPL SPACEOAR SYSTEM 10ML (Spacer) ×2 IMPLANT
IMPLANT SPACEOAR SYSTEM 10ML (Spacer) ×3 IMPLANT
IV SOD CHL 0.9% 1000ML (IV SOLUTION) ×3 IMPLANT
KIT TURNOVER CYSTO (KITS) ×3 IMPLANT
MARKER SKIN DUAL TIP RULER LAB (MISCELLANEOUS) ×3 IMPLANT
PACK CYSTO (CUSTOM PROCEDURE TRAY) ×3 IMPLANT
SURGILUBE 2OZ TUBE FLIPTOP (MISCELLANEOUS) ×3 IMPLANT
SUT BONE WAX W31G (SUTURE) IMPLANT
SYR 10ML LL (SYRINGE) ×3 IMPLANT
UNDERPAD 30X30 (UNDERPADS AND DIAPERS) ×6 IMPLANT
WATER STERILE IRR 500ML POUR (IV SOLUTION) ×3 IMPLANT
i-seed agx100 ×219 IMPLANT

## 2018-03-15 NOTE — Interval H&P Note (Signed)
History and Physical Interval Note:  03/15/2018 12:37 PM  Patrick Kidd  has presented today for surgery, with the diagnosis of PROSTATE CANCER  The various methods of treatment have been discussed with the patient and family. After consideration of risks, benefits and other options for treatment, the patient has consented to  Procedure(s) with comments: RADIOACTIVE SEED IMPLANT/BRACHYTHERAPY IMPLANT (N/A) -      SEEDS IMPLANTED SPACE OAR INSTILLATION (N/A) CYSTOSCOPY FLEXIBLE - NO SEEDS FOUND IN BLADDER as a surgical intervention .  The patient's history has been reviewed, patient examined, no change in status, stable for surgery.  I have reviewed the patient's chart and labs.  Questions were answered to the patient's satisfaction.     Marton Redwood, III

## 2018-03-15 NOTE — H&P (Signed)
CC: I have prostate cancer.  HPI: Patrick Kidd is a 73 year-old male established patient who is here evaluation for treatment of prostate cancer.  His prostate cancer was diagnosed 11/11/2017. He does have the pathology report from his biopsy. His cancer was diagnosed by Dr. Gloriann Loan. His PSA at his time of diagnosis was 13.2.   He has not undergone surgery for treatment. He has not undergone External Beam Radiation Therapy for treatment. He has not undergone Hormonal Therapy for treatment.   He does not have urinary incontinence. He does have problems with erectile dysfunction. He has not recently had unwanted weight loss. He is not having pain in new locations.   PSA 13.2 on 11/11/2017  TRUS size 47 g  He has some erectile dysfunction responsive to Viagra  Denies abdominal surgeries  Prostate exam: There is some asymmetry to the prostate but there was no obvious nodularity or firmness   Patient returns to go over prostate biopsy results. Unfortunately, this revealed Gleason 3+4 adenocarcinoma of the prostate in 4 out of 12 cores. He has been doing okay since a prostate biopsy.   02/09/2018  Patient was evaluated by Dr. Tammi Klippel. He has decided to proceed with brachytherapy with spaceoar. He and his wife have a number of questions today. He does complain about some fatigue after having a viral respiratory infection but feels like he is improving.     ALLERGIES: No Allergies    MEDICATIONS: Metformin Hcl 500 mg tablet  [place]  Farxiga 5 mg tablet  Gabapentin 300 mg tablet Oral  Pantoprazole Sodium 40 mg tablet, delayed release  Sildenafil Citrate 100 mg tablet take 1 tablet as needed     GU PSH: Prostate Needle Biopsy - 12/10/2017      PSH Notes: Cataract Surgery  Ulcer removed near Esophagus     NON-GU PSH: Surgical Pathology, Gross And Microscopic Examination For Prostate Needle - 12/10/2017    GU PMH: Prostate Cancer - 12/24/2017 BPH w/LUTS - 11/20/2017 Elevated PSA -  11/20/2017 Urinary Urgency - 11/20/2017 Nocturia, Nocturia - 2016 Urge incontinence, Urge incontinence of urine - 2016 Urinary Frequency, Increased urinary frequency - 2016    NON-GU PMH: Encounter for general adult medical examination without abnormal findings, Encounter for preventive health examination - 2015 Personal history of other diseases of the digestive system, History of esophageal reflux - 2015 Personal history of other diseases of the musculoskeletal system and connective tissue, History of gout - 2015 Arthritis Diabetes Type 2 Diverticulosis GERD Gout Other cirrhosis of liver Sleep Apnea    FAMILY HISTORY: Death of family member - Runs In Family leukemia - Runs In Family malignant neoplasm - Runs In Family   SOCIAL HISTORY: Marital Status: Married Preferred Language: English; Race: White Current Smoking Status: Patient does not smoke anymore. Has not smoked since 11/04/1970.   Tobacco Use Assessment Completed: Used Tobacco in last 30 days?     Notes: Alcohol use, Former smoker, Married, Occupation, Number of children, Caffeine use   REVIEW OF SYSTEMS:    GU Review Male:   Patient reports frequent urination and leakage of urine. Patient denies hard to postpone urination, burning/ pain with urination, get up at night to urinate, stream starts and stops, trouble starting your stream, have to strain to urinate , erection problems, and penile pain.  Gastrointestinal (Upper):   Patient denies nausea, vomiting, and indigestion/ heartburn.  Gastrointestinal (Lower):   Patient denies diarrhea and constipation.  Constitutional:   Patient denies fever, night sweats, weight  loss, and fatigue.  Skin:   Patient denies skin rash/ lesion and itching.  Eyes:   Patient denies blurred vision and double vision.  Ears/ Nose/ Throat:   Patient denies sore throat and sinus problems.  Hematologic/Lymphatic:   Patient denies swollen glands and easy bruising.  Cardiovascular:   Patient  denies leg swelling and chest pains.  Respiratory:   Patient denies cough and shortness of breath.  Endocrine:   Patient denies excessive thirst.  Musculoskeletal:   Patient denies back pain and joint pain.  Neurological:   Patient denies headaches and dizziness.  Psychologic:   Patient denies depression and anxiety.   Notes: Frequency 10x daily    VITAL SIGNS:      02/09/2018 02:40 PM  Weight 247 lb / 112.04 kg  BP 145/83 mmHg  Heart Rate 80 /min  Temperature 98.4 F / 36.8 C   MULTI-SYSTEM PHYSICAL EXAMINATION:    Constitutional: Well-nourished. No physical deformities. Normally developed. Good grooming.  Respiratory: No labored breathing, no use of accessory muscles.   Cardiovascular: Normal temperature, adequate perfusion of extremities  Skin: No paleness, no jaundice  Neurologic / Psychiatric: Oriented to time, oriented to place, oriented to person. No depression, no anxiety, no agitation.  Gastrointestinal: No mass, no tenderness, no rigidity, mildly obese abdomen.   Eyes: Normal conjunctivae. Normal eyelids.  Musculoskeletal: Normal gait and station of head and neck.     PAST DATA REVIEWED:  Source Of History:  Patient  Records Review:   Previous Patient Records   PROCEDURES:          Urinalysis Dipstick Dipstick Cont'd  Color: Yellow Bilirubin: Neg mg/dL  Appearance: Clear Ketones: Neg mg/dL  Specific Gravity: 1.015 Blood: Neg ery/uL  pH: 6.0 Protein: Neg mg/dL  Glucose: Neg mg/dL Urobilinogen: 0.2 mg/dL    Nitrites: Neg    Leukocyte Esterase: Neg leu/uL    ASSESSMENT:      ICD-10 Details  1 GU:   Prostate Cancer - C61    PLAN:           Document Letter(s):  Created for Patient: Clinical Summary         Notes:   Proceed with brachytherapy and spaceoar. All questions answered regarding brachytherapy as discussed in prior note.  Cc: Scarlette Calico, M.D.          Next Appointment:      Next Appointment: 03/15/2018 01:00 PM    Appointment Type: Surgery      Location: [facility] [phone] [id number]    Provider: Link Snuffer, III, M.D.    Reason for Visit: OP NE SEEDS SPACE OAR     Signed by Link Snuffer, III, M.D. on 02/09/18 at 3:37 PM (EST

## 2018-03-15 NOTE — Discharge Instructions (Addendum)
Antibiotics °You may be given a prescription for an antibiotic to take when you arrive home. If so, be sure to take every tablet in the bottle, even if you are feeling better before the prescription is finished. If you begin itching, notice a rash or start to swell on your trunk, arms, legs and/or throat, immediately stop taking the antibiotic and call your Urologist. °Diet °Resume your usual diet when you return home. To keep your bowels moving easily and softly, drink prune, apple and cranberry juice at room temperature. You may also take a stool softener, such as Colace, which is available without prescription at local pharmacies. °Daily activities °? No driving or heavy lifting for at least two days after the implant. °? No bike riding, horseback riding or riding lawn mowers for the first month after the implant. °? Any strenuous physical activity should be approved by your doctor before you resume it. °Sexual relations °You may resume sexual relations two weeks after the procedure. A condom should be used for the first two weeks. Your semen may be dark brown or black; this is normal and is related bleeding that may have occurred during the implant. °Postoperative swelling °Expect swelling and bruising of the scrotum and perineum (the area between the scrotum and anus). Both the swelling and the bruising should resolve in l or 2 weeks. Ice packs and over- the-counter medications such as Tylenol, Advil or Aleve may lessen your discomfort. °Postoperative urination °Most men experience burning on urination and/or urinary frequency. If this becomes bothersome, contact your Urologist.  Medication can be prescribed to relieve these problems.  It is normal to have some blood in your urine for a few days after the implant. °Special instructions related to the seeds °It is unlikely that you will pass an Iodine-125 seed in your urine. The seeds are silver in color and are about as large as a grain of rice. If you pass a  seed, do not handle it with your fingers. Use a spoon to place it in an envelope or jar in place this in base occluded area such as the garage or basement for return to the radiation clinic at your convenience. ° °Contact your doctor for °? Temperature greater than 101 F °? Increasing pain °? Inability to urinate °Follow-up ° You should have follow up with your urologist and radiation oncologist about 3 weeks after the procedure. °General information regarding Iodine seeds °? Iodine-125 is a low energy radioactive material. It is not deeply penetrating and loses energy at short distances. Your prostate will absorb the radiation. Objects that are touched or used by the patient do not become radioactive. °? Body wastes (urine and stool) or body fluids (saliva, tears, semen or blood) are not radioactive. °? The Nuclear Regulatory Commission (NRC) has determined that no radiation precautions are needed for patients undergoing Iodine-125 seed implantation. The NRC states that such patients do not present a risk to the people around them, including young children and pregnant women. However, in keeping with the general principle that radiation exposure should be kept as low reasonably possible, we suggest the following: °? Children and pets should not sit on the patient's lap for the first two (2) weeks after the implant. °? Pregnant (or possibly pregnant) women should avoid prolonged, close contact with the patient for the first two (2) weeks after the implant. °? A distance of three (3) feet is acceptable. °? At a distance of three (3) feet, there is no limit to the   length of time anyone can be with the patient. ° ° ° °Post Anesthesia Home Care Instructions ° °Activity: °Get plenty of rest for the remainder of the day. A responsible individual must stay with you for 24 hours following the procedure.  °For the next 24 hours, DO NOT: °-Drive a car °-Operate machinery °-Drink alcoholic beverages °-Take any medication  unless instructed by your physician °-Make any legal decisions or sign important papers. ° °Meals: °Start with liquid foods such as gelatin or soup. Progress to regular foods as tolerated. Avoid greasy, spicy, heavy foods. If nausea and/or vomiting occur, drink only clear liquids until the nausea and/or vomiting subsides. Call your physician if vomiting continues. ° °Special Instructions/Symptoms: °Your throat may feel dry or sore from the anesthesia or the breathing tube placed in your throat during surgery. If this causes discomfort, gargle with warm salt water. The discomfort should disappear within 24 hours. ° °If you had a scopolamine patch placed behind your ear for the management of post- operative nausea and/or vomiting: ° °1. The medication in the patch is effective for 72 hours, after which it should be removed.  Wrap patch in a tissue and discard in the trash. Wash hands thoroughly with soap and water. °2. You may remove the patch earlier than 72 hours if you experience unpleasant side effects which may include dry mouth, dizziness or visual disturbances. °3. Avoid touching the patch. Wash your hands with soap and water after contact with the patch. °  ° ° ° °

## 2018-03-15 NOTE — Transfer of Care (Signed)
Immediate Anesthesia Transfer of Care Note  Patient: FAHEEM ZIEMANN  Procedure(s) Performed: Procedure(s) (LRB): RADIOACTIVE SEED IMPLANT/BRACHYTHERAPY IMPLANT (N/A) SPACE OAR INSTILLATION (N/A) CYSTOSCOPY FLEXIBLE (N/A)  Patient Location: PACU  Anesthesia Type: General  Level of Consciousness: awake, oriented, sedated and patient cooperative  Airway & Oxygen Therapy: Patient Spontanous Breathing and Patient connected to face mask oxygen  Post-op Assessment: Report given to PACU RN and Post -op Vital signs reviewed and stable  Post vital signs: Reviewed and stable  Complications: No apparent anesthesia complications  Last Vitals:  Vitals Value Taken Time  BP    Temp    Pulse    Resp    SpO2      Last Pain:  Vitals:   03/15/18 1131  TempSrc:   PainSc: 0-No pain      Patients Stated Pain Goal: 6 (03/15/18 1131)

## 2018-03-15 NOTE — Anesthesia Preprocedure Evaluation (Signed)
Anesthesia Evaluation    Airway Mallampati: II  TM Distance: >3 FB Neck ROM: Full    Dental no notable dental hx.    Pulmonary sleep apnea , former smoker,    Pulmonary exam normal breath sounds clear to auscultation       Cardiovascular hypertension, Pt. on medications Normal cardiovascular exam Rhythm:Regular Rate:Normal     Neuro/Psych Anxiety    GI/Hepatic GERD  ,(+) Cirrhosis     substance abuse  alcohol use, GI bleed   Endo/Other  diabetes, Type 2  Renal/GU      Musculoskeletal  (+) Arthritis , Osteoarthritis,    Abdominal (+) + obese,   Peds  Hematology  (+) anemia , thrombocytopenia   Anesthesia Other Findings   Reproductive/Obstetrics                             Anesthesia Physical  Anesthesia Plan  ASA: III  Anesthesia Plan: General   Post-op Pain Management:    Induction: Intravenous  PONV Risk Score and Plan: 2 and Ondansetron and Midazolam  Airway Management Planned: LMA  Additional Equipment:   Intra-op Plan:   Post-operative Plan: Extubation in OR  Informed Consent: I have reviewed the patients History and Physical, chart, labs and discussed the procedure including the risks, benefits and alternatives for the proposed anesthesia with the patient or authorized representative who has indicated his/her understanding and acceptance.     Dental advisory given  Plan Discussed with: CRNA  Anesthesia Plan Comments:         Anesthesia Quick Evaluation

## 2018-03-15 NOTE — Op Note (Signed)
PATIENT:  Patrick Kidd  PRE-OPERATIVE DIAGNOSIS:  Adenocarcinoma of the prostate  POST-OPERATIVE DIAGNOSIS:  Same  PROCEDURE:  1. I-125 radioactive seed implantation 2. Cystoscopy  3. Placement of [name]  SURGEON:  Surgeon(s): Wendie Simmer, MD  Radiation oncologist: Dr. Tyler Pita  ANESTHESIA:  General  EBL:  Minimal  DRAINS: 13 French Foley catheter  INDICATION: Patrick Kidd  Description of procedure: After informed consent the patient was brought to the major OR, placed on the table and administered general anesthesia. He was then moved to the modified lithotomy position with his perineum perpendicular to the floor. His perineum and genitalia were then sterilely prepped. An official timeout was then performed. A 16 French Foley catheter was then placed in the bladder and filled with dilute contrast, a rectal tube was placed in the rectum and the transrectal ultrasound probe was placed in the rectum and affixed to the stand. He was then sterilely draped.  Real time ultrasonography was used along with the seed planning software [employer] Prostate. This was used to develop the seed plan including the number of needles as well as number of seeds required for complete and adequate coverage. Real-time ultrasonography was then used along with the previously developed plan and the Nucletron device to implant a total of 73 seeds using 26 needles. This proceeded without difficulty or complication.   I then proceeded with placement of SpaceOARby introducing a needle with the bevel angled inferiorly approximately 2 cm superior to the anus. This was angled downward and under direct ultrasound was placed within the space between the prostatic capsule and rectum. This was confirmed with a small amount of sterile saline injected and this was performed under direct ultrasound. I then attached the SpaceOARto the needle and injected this in the space between the prostate and rectum  with good placement noted.  A Foley catheter was then removed as well as the transrectal ultrasound probe and rectal probe. Flexible cystoscopy was then performed using the 17 French flexible scope which revealed a normal urethra throughout its length down to the sphincter which appeared intact. The prostatic urethra revealed bilobar hypertrophy but no evidence of obstruction, seeds, spacers or lesions. The bladder was then entered and fully and systematically inspected. The ureteral orifices were noted to be of normal configuration and position. The mucosa revealed no evidence of tumors. There were also no stones identified within the bladder. I noted no seeds or spacers on the floor of the bladder and retroflexion of the scope revealed no seeds protruding from the base of the prostate.  The cystoscope was then removed and the patient was awakened and taken to recovery room in stable and satisfactory condition. He tolerated procedure well and there were no intraoperative complications.

## 2018-03-15 NOTE — Anesthesia Procedure Notes (Signed)
Procedure Name: LMA Insertion Date/Time: 03/15/2018 1:57 PM Performed by: Lieutenant Diego, CRNA Pre-anesthesia Checklist: Patient identified, Emergency Drugs available, Suction available and Patient being monitored Patient Re-evaluated:Patient Re-evaluated prior to induction Oxygen Delivery Method: Circle system utilized Preoxygenation: Pre-oxygenation with 100% oxygen Induction Type: IV induction Ventilation: Mask ventilation without difficulty LMA: LMA inserted LMA Size: 5.0 Number of attempts: 1 Placement Confirmation: positive ETCO2 and breath sounds checked- equal and bilateral Tube secured with: Tape Dental Injury: Teeth and Oropharynx as per pre-operative assessment

## 2018-03-16 ENCOUNTER — Encounter (HOSPITAL_BASED_OUTPATIENT_CLINIC_OR_DEPARTMENT_OTHER): Payer: Self-pay | Admitting: Urology

## 2018-03-16 ENCOUNTER — Other Ambulatory Visit: Payer: Self-pay | Admitting: Neurology

## 2018-03-16 NOTE — Anesthesia Postprocedure Evaluation (Signed)
Anesthesia Post Note  Patient: Patrick Kidd  Procedure(s) Performed: RADIOACTIVE SEED IMPLANT/BRACHYTHERAPY IMPLANT (N/A Prostate) SPACE OAR INSTILLATION (N/A Prostate) CYSTOSCOPY FLEXIBLE (N/A Bladder)     Patient location during evaluation: PACU Anesthesia Type: General Level of consciousness: awake and alert Pain management: pain level controlled Vital Signs Assessment: post-procedure vital signs reviewed and stable Respiratory status: spontaneous breathing, nonlabored ventilation and respiratory function stable Cardiovascular status: blood pressure returned to baseline and stable Postop Assessment: no apparent nausea or vomiting Anesthetic complications: no    Last Vitals:  Vitals:   03/15/18 1600 03/15/18 1640  BP: 132/68 (!) 147/76  Pulse: 76 79  Resp: 19 14  Temp:  36.7 C  SpO2: 91% 96%    Last Pain:  Vitals:   03/15/18 1630  TempSrc:   PainSc: 0-No pain                 Lynda Rainwater

## 2018-03-17 ENCOUNTER — Other Ambulatory Visit: Payer: Self-pay | Admitting: Internal Medicine

## 2018-03-18 NOTE — Progress Notes (Signed)
  Radiation Oncology         [phone] ________________________________  Name: Patrick Kidd MRN: [id number]  Date: 03/18/2018  DOB: 1946-01-24       Prostate Seed Implant  UU:[id number], Patrick Right, MD  No ref. provider found  DIAGNOSIS: 73 y.o. gentleman with intermediate risk, Stage T1c adenocarcinoma of the prostate with Gleason Score of 3+4, and PSA of 13.2    ICD-10-CM   1. Prostate cancer ([place]) C61 DG Chest 2 View    DG Chest 2 View    PROCEDURE: Insertion of radioactive I-125 seeds into the prostate gland.  RADIATION DOSE: 145 Gy, definitive therapy.  TECHNIQUE: SONYA PUCCI was brought to the operating room with the urologist. He was placed in the dorsolithotomy position. He was catheterized and a rectal tube was inserted. The perineum was shaved, prepped and draped. The ultrasound probe was then introduced into the rectum to see the prostate gland.  TREATMENT DEVICE: A needle grid was attached to the ultrasound probe stand and anchor needles were placed.  3D PLANNING: The prostate was imaged in 3D using a sagittal sweep of the prostate probe. These images were transferred to the planning computer. There, the prostate, urethra and rectum were defined on each axial reconstructed image. Then, the software created an optimized 3D plan and a few seed positions were adjusted. The quality of the plan was reviewed using [facility] information for the target and the following two organs at risk:  Urethra and Rectum.  Then the accepted plan was printed and handed off to the radiation therapist.  Under my supervision, the custom loading of the seeds and spacers was carried out and loaded into sealed vicryl sleeves.  These pre-loaded needles were then placed into the needle holder.Marland Kitchen  PROSTATE VOLUME STUDY:  Using transrectal ultrasound the volume of the prostate was verified to be 53 cc.  SPECIAL TREATMENT PROCEDURE/SUPERVISION AND HANDLING: The pre-loaded needles were then delivered under  sagittal guidance. A total of 26 needles were used to deposit 73 seeds in the prostate gland. The individual seed activity was 0.550 mCi.  [name]:  Yes  COMPLEX SIMULATION: At the end of the procedure, an anterior radiograph of the pelvis was obtained to document seed positioning and count. Cystoscopy was performed to check the urethra and bladder.  MICRODOSIMETRY: At the end of the procedure, the patient was emitting 0.045 mR/hr at 1 meter. Accordingly, he was considered safe for hospital discharge.  PLAN: The patient will return to the radiation oncology clinic for post implant CT dosimetry in three weeks.   ________________________________  Sheral Apley Tammi Klippel, M.D.

## 2018-03-30 ENCOUNTER — Telehealth: Payer: Self-pay | Admitting: *Deleted

## 2018-03-30 NOTE — Telephone Encounter (Signed)
CALLED PATIENT TO REMIND OF POST SEED APPTS. FOR 03-31-18 AND HIS MRI FOR 04-02-18 - ARRIVAL TIME- 6:30 PM @ [facility], NO RESTRICTIONS TO TEST, SPOKE WITH PATIENT AND HE IS AWARE OF THESE APPTS.

## 2018-03-31 ENCOUNTER — Other Ambulatory Visit: Payer: Self-pay

## 2018-03-31 ENCOUNTER — Ambulatory Visit
Admission: RE | Admit: 2018-03-31 | Discharge: 2018-03-31 | Disposition: A | Discharge status: Home / Self Care | Payer: Medicare Other | Source: Ambulatory Visit | Attending: Radiation Oncology | Admitting: Radiation Oncology

## 2018-03-31 ENCOUNTER — Encounter: Payer: Self-pay | Admitting: Urology

## 2018-03-31 ENCOUNTER — Ambulatory Visit
Admission: RE | Admit: 2018-03-31 | Discharge: 2018-03-31 | Disposition: A | Discharge status: Home / Self Care | Payer: Medicare Other | Source: Ambulatory Visit | Attending: Urology | Admitting: Urology

## 2018-03-31 VITALS — BP 152/73 | HR 81 | Temp 98.2°F | Resp 18 | Wt 249.4 lb

## 2018-03-31 DIAGNOSIS — Z7984 Long term (current) use of oral hypoglycemic drugs: Secondary | ICD-10-CM | POA: Diagnosis not present

## 2018-03-31 DIAGNOSIS — R350 Frequency of micturition: Secondary | ICD-10-CM | POA: Diagnosis not present

## 2018-03-31 DIAGNOSIS — C61 Malignant neoplasm of prostate: Secondary | ICD-10-CM

## 2018-03-31 DIAGNOSIS — Z510 Encounter for antineoplastic radiation therapy: Secondary | ICD-10-CM | POA: Insufficient documentation

## 2018-03-31 DIAGNOSIS — Z79899 Other long term (current) drug therapy: Secondary | ICD-10-CM | POA: Insufficient documentation

## 2018-03-31 DIAGNOSIS — R3911 Hesitancy of micturition: Secondary | ICD-10-CM | POA: Insufficient documentation

## 2018-03-31 DIAGNOSIS — Z923 Personal history of irradiation: Secondary | ICD-10-CM | POA: Diagnosis not present

## 2018-03-31 NOTE — Progress Notes (Signed)
Pt here today for a post seed appointment. Pt has an MRI set up for 04/02/18 and a follow-up appointment with [facility] on 04/05/18. Pt denies having any pian. Pt denies having any dysuria or hematuria. Pt states having urgency and some leakage.  BP (!) 152/73   Pulse 81   Temp 98.2 F (36.8 C) (Oral)   Resp 18   Wt 249 lb 6.4 oz (113.1 kg)   SpO2 96%   BMI 32.02 kg/m    BP (!) 152/73   Pulse 81   Temp 98.2 F (36.8 C) (Oral)   Resp 18   Wt 249 lb 6.4 oz (113.1 kg)   SpO2 96%   BMI 32.02 kg/m

## 2018-03-31 NOTE — Progress Notes (Signed)
Radiation Oncology         [phone] ________________________________  Name: DREVION OFFORD MRN: [id number]  Date: 03/31/2018  DOB: [birth year]  Post-Seed Follow-Up Visit Note  CC: [name], MD  [name], MD  Diagnosis:   73 y.o. gentleman with intermediate risk, Stage T1c adenocarcinoma of the prostate with Gleason Score of 3+4, and PSA of 13.2.    ICD-10-CM   1. Malignant neoplasm of prostate ([facility]) C61     Interval Since Last Radiation:  2 weeks 03/15/18:  Insertion of radioactive I-125 seeds into the prostate gland; 145 Gy, definitive therapy with [name] gel placement.  Narrative:  The patient returns today for routine follow-up.  He is complaining of increased urinary frequency and urinary hesitation symptoms. He filled out a questionnaire regarding urinary function today providing and overall IPSS score of 16 characterizing his symptoms as moderate with frequency, urgency and intermittency.  His pre-implant score was 6. He specifically denies dysuria, gross hematuria, fever, chills, straining to void or incomplete emptying.  He reports a healthy appetite and is maintaining his weight.  He denies abdominal pain, N/V, diarrhea or constipation. Overall, he is pleased with his progress to date.  ALLERGIES:  has No Known Allergies.  Meds: Current Outpatient Medications  Medication Sig Dispense Refill  . blood glucose meter kit and supplies KIT Use to test blood sugar once daily. DX E11.8 1 each 0  . Blood Glucose Monitoring Suppl (FREESTYLE FREEDOM LITE) w/Device KIT Use to check blood sugars 2 times daily 1 each 1  . gabapentin (NEURONTIN) 300 MG capsule TAKE 1 CAPSULE(300 MG) BY MOUTH FOUR TIMES DAILY 120 capsule 5  . glucose blood (COOL BLOOD GLUCOSE TEST STRIPS) test strip Use to check blood sugar daily. DX: E11.8 100 each 12  . HYDROcodone-acetaminophen (NORCO) 5-325 MG tablet Take 1 tablet by mouth every 4 (four) hours as needed for moderate pain. 10  tablet 0  . Lancets (ACCU-CHEK MULTICLIX) lancets Use to check sugar daily. DX: E11.8 100 each 12  . metFORMIN (GLUCOPHAGE-XR) 500 MG 24 hr tablet Take 1 tablet (500 mg total) by mouth daily with breakfast. 90 tablet 3  . pantoprazole (PROTONIX) 40 MG tablet TAKE 1 TABLET(40 MG) BY MOUTH TWICE DAILY 180 tablet 0   No current facility-administered medications for this visit.     Physical Findings: In general this is a well appearing Caucasian male in no acute distress. He's alert and oriented x4 and appropriate throughout the examination. Cardiopulmonary assessment is negative for acute distress and he exhibits normal effort.   Lab Findings: Lab Results  Component Value Date   WBC 5.6 03/08/2018   HGB 17.1 (H) 03/08/2018   HCT 48.1 03/08/2018   MCV 97.8 03/08/2018   PLT 83 (L) 03/08/2018    Radiographic Findings:  Patient underwent CT imaging in our clinic for post implant dosimetry. The CT will be reviewed by Dr. Tammi Klippel to ensure an adequate distribution of radioactive seeds throughout the prostate gland and confirm that there are no seeds in or near the rectum. He is scheduled for prostate MRI 04/02/18 and these images will be fused with his CT images for further evaluation.  We suspect the final radiation plan and dosimetry will show appropriate coverage of the prostate gland.   Impression/Plan: 73 y.o. gentleman with intermediate risk, Stage T1c adenocarcinoma of the prostate with Gleason Score of 3+4, and PSA of 13.2. The patient is recovering from the effects of radiation. His urinary symptoms  should gradually improve over the next 4-6 months. We talked about this today. He is encouraged by his improvement already and is otherwise pleased with his outcome. We also talked about long-term follow-up for prostate cancer following seed implant. He understands that ongoing PSA determinations and digital rectal exams will help perform surveillance to rule out disease recurrence. He has a  follow up appointment scheduled with Jiles Crocker, NP/Dr. Gloriann Loan on 04/05/18. He understands what to expect with his PSA measures. Patient was also educated today about some of the long-term effects from radiation including a small risk for rectal bleeding and possibly erectile dysfunction. We talked about some of the general management approaches to these potential complications. However, I did encourage the patient to contact our office or return at any point if he has questions or concerns related to his previous radiation and prostate cancer.    Nicholos Johns, PA-C

## 2018-04-01 ENCOUNTER — Telehealth: Payer: Self-pay | Admitting: Radiation Oncology

## 2018-04-01 NOTE — Telephone Encounter (Signed)
Phoned patient and relayed the following as requested by Ashlyn Bruning, PA-C.   At our follow up visit earlier today, the patient informed me that he is due for repeat imaging of a liver lesion that was found on an MRI in 11/2017 and was hoping that we change our order to include the liver at the time of his post-seed MRI for [name] gel evaluation.  Will you please call and let Patrick Kidd know that unfortunately, because the radiologist's documented recommendation on his previous abdominal MRI was for repeat imaging in 6 months, insurance will not approve to have the scan sooner. Also, the recommendation is for a contrast MRI abdomen for follow up of the liver lesion and what we need is a NON contrast MRI of the pelvis to see the [name] gel- contrast would actually obscure our view of the [name] gel. So, we will need to proceed as planned with the abbreviated, non-contrast pelvic MRI on 04/02/18 and leave the MR abdomen with contrast for Dr. Ronnald Ramp to coordinate in 05/2018. Also, he mentioned that he is claustrophobic so please let him know that I am happy to send a Rx for ativan to his pharmacy to take before the MRI if he would like. Just let me know!  Patient verbalized understanding. Expressed appreciation for our attempt to streamline things. Denies any additional needs at this time.

## 2018-04-01 NOTE — Telephone Encounter (Signed)
-----   Message from [place], [place] sent at 03/31/2018  4:07 PM EST ----- Regarding: MRI Patrick Kidd, At our follow up visit earlier today, the patient informed me that he is due for repeat imaging of a liver lesion that was found on an MRI in 11/2017 and was hoping that we change our order to include the liver at the time of his post-seed MRI for [name] gel evaluation.  Will you please call and let Patrick Kidd know that unfortunately, because the radiologist's documented recommendation on his previous abdominal MRI was for repeat imaging in 6 months, insurance will not approve to have the scan sooner. Also, the recommendation is for a contrast MRI abdomen for follow up of the liver lesion and what we need is a NON contrast MRI of the pelvis to see the [name] gel- contrast would actually obscure our view of the [name] gel. So, we will need to proceed as planned with the abbreviated, non-contrast pelvic MRI on 04/02/18 and leave the MR abdomen with contrast for Dr. Ronnald Kidd to coordinate in 05/2018. Also, he mentioned that he is claustrophobic so please let him know that I am happy to send a Rx for ativan to his pharmacy to take before the MRI if he would like. Just let me know! Thank you!! -Patrick Kidd

## 2018-04-02 ENCOUNTER — Ambulatory Visit (HOSPITAL_COMMUNITY)
Admission: RE | Admit: 2018-04-02 | Discharge: 2018-04-02 | Disposition: A | Discharge status: Home / Self Care | Payer: Medicare Other | Source: Ambulatory Visit | Attending: Urology | Admitting: Urology

## 2018-04-02 DIAGNOSIS — C61 Malignant neoplasm of prostate: Secondary | ICD-10-CM | POA: Diagnosis not present

## 2018-04-04 NOTE — Progress Notes (Signed)
  Radiation Oncology         [phone] ________________________________  Name: Patrick Kidd MRN: [id number]  Date: 03/31/2018  DOB: Jul 05, 1945  COMPLEX SIMULATION NOTE  NARRATIVE:  The patient was brought to the [place] today following prostate seed implantation approximately one month ago.  Identity was confirmed.  All relevant records and images related to the planned course of therapy were reviewed.  Then, the patient was set-up supine.  CT images were obtained.  The CT images were loaded into the planning software.  Then the prostate and rectum were contoured.  Treatment planning then occurred.  The implanted iodine 125 seeds were identified by the physics staff for projection of radiation distribution  I have requested : 3D Simulation  I have requested a DVH of the following structures: Prostate and rectum.    ________________________________  Sheral Apley Tammi Klippel, M.D.

## 2018-04-06 DIAGNOSIS — C61 Malignant neoplasm of prostate: Secondary | ICD-10-CM | POA: Diagnosis not present

## 2018-04-06 DIAGNOSIS — R8271 Bacteriuria: Secondary | ICD-10-CM | POA: Diagnosis not present

## 2018-04-14 DIAGNOSIS — E119 Type 2 diabetes mellitus without complications: Secondary | ICD-10-CM | POA: Diagnosis not present

## 2018-04-14 DIAGNOSIS — Z961 Presence of intraocular lens: Secondary | ICD-10-CM | POA: Diagnosis not present

## 2018-04-14 LAB — HM DIABETES EYE EXAM

## 2018-04-19 ENCOUNTER — Other Ambulatory Visit: Payer: Self-pay | Admitting: Internal Medicine

## 2018-05-03 ENCOUNTER — Encounter: Payer: Self-pay | Admitting: Radiation Oncology

## 2018-05-03 ENCOUNTER — Ambulatory Visit
Admission: RE | Admit: 2018-05-03 | Discharge: 2018-05-03 | Disposition: A | Discharge status: Home / Self Care | Payer: Medicare Other | Source: Ambulatory Visit | Attending: Radiation Oncology | Admitting: Radiation Oncology

## 2018-05-03 DIAGNOSIS — C61 Malignant neoplasm of prostate: Secondary | ICD-10-CM | POA: Insufficient documentation

## 2018-06-07 NOTE — Progress Notes (Signed)
  Radiation Oncology         [phone] ________________________________  Name: Patrick Kidd MRN: [id number]  Date: 05/03/2018  DOB: [birth year]  3D Planning Note   Prostate Brachytherapy Post-Implant Dosimetry  Diagnosis: 73 y.o. gentleman with intermediate risk, Stage T1c adenocarcinoma of the prostate with Gleason Score of 3+4, and PSA of 13.2  Narrative: On a previous date, Patrick Kidd returned following prostate seed implantation for post implant planning. He underwent CT scan complex simulation to delineate the three-dimensional structures of the pelvis and demonstrate the radiation distribution.  Since that time, the seed localization, and complex isodose planning with dose volume histograms have now been completed.  Results:   Prostate Coverage - The dose of radiation delivered to the 90% or more of the prostate gland (D90) was 98.77% of the prescription dose. This exceeds our goal of greater than 90%. Rectal Sparing - The volume of rectal tissue receiving the prescription dose or higher was 0.0 cc. This falls under our thresholds tolerance of 1.0 cc.  Impression: The prostate seed implant appears to show adequate target coverage and appropriate rectal sparing.  Plan:  The patient will continue to follow with urology for ongoing PSA determinations. I would anticipate a high likelihood for local tumor control with minimal risk for rectal morbidity.  ________________________________  Sheral Apley Tammi Klippel, M.D.

## 2018-06-14 ENCOUNTER — Other Ambulatory Visit: Payer: Self-pay | Admitting: Internal Medicine

## 2018-06-14 DIAGNOSIS — K7030 Alcoholic cirrhosis of liver without ascites: Secondary | ICD-10-CM

## 2018-06-14 DIAGNOSIS — K769 Liver disease, unspecified: Secondary | ICD-10-CM

## 2018-06-17 ENCOUNTER — Other Ambulatory Visit: Payer: Self-pay | Admitting: Internal Medicine

## 2018-06-17 ENCOUNTER — Telehealth: Payer: Self-pay | Admitting: Internal Medicine

## 2018-06-17 DIAGNOSIS — K769 Liver disease, unspecified: Secondary | ICD-10-CM

## 2018-06-17 DIAGNOSIS — K7030 Alcoholic cirrhosis of liver without ascites: Secondary | ICD-10-CM

## 2018-06-17 NOTE — Telephone Encounter (Signed)
done

## 2018-06-22 ENCOUNTER — Telehealth: Payer: Self-pay | Admitting: *Deleted

## 2018-06-22 ENCOUNTER — Other Ambulatory Visit: Payer: Self-pay | Admitting: Internal Medicine

## 2018-06-22 DIAGNOSIS — K7030 Alcoholic cirrhosis of liver without ascites: Secondary | ICD-10-CM

## 2018-06-22 DIAGNOSIS — K769 Liver disease, unspecified: Secondary | ICD-10-CM

## 2018-06-22 DIAGNOSIS — K760 Fatty (change of) liver, not elsewhere classified: Secondary | ICD-10-CM

## 2018-06-22 NOTE — Telephone Encounter (Signed)
Brianna from [facility] called stating the MRI technician advised the patient's liver disease does not negate the need to order Liver MRI with/without contrast.  They are recommending the order be placed W & W/O contrast as suggested on his 11/24/17 study. Please advise.

## 2018-07-06 ENCOUNTER — Encounter: Payer: Self-pay | Admitting: Internal Medicine

## 2018-07-06 ENCOUNTER — Ambulatory Visit (INDEPENDENT_AMBULATORY_CARE_PROVIDER_SITE_OTHER): Payer: Medicare Other | Admitting: Internal Medicine

## 2018-07-06 ENCOUNTER — Other Ambulatory Visit (INDEPENDENT_AMBULATORY_CARE_PROVIDER_SITE_OTHER): Payer: Medicare Other

## 2018-07-06 ENCOUNTER — Other Ambulatory Visit: Payer: Self-pay

## 2018-07-06 VITALS — BP 160/78 | HR 92 | Temp 98.8°F | Resp 16 | Ht 74.0 in | Wt 250.0 lb

## 2018-07-06 DIAGNOSIS — Z794 Long term (current) use of insulin: Secondary | ICD-10-CM

## 2018-07-06 DIAGNOSIS — E538 Deficiency of other specified B group vitamins: Secondary | ICD-10-CM | POA: Diagnosis not present

## 2018-07-06 DIAGNOSIS — K7030 Alcoholic cirrhosis of liver without ascites: Secondary | ICD-10-CM

## 2018-07-06 DIAGNOSIS — H6123 Impacted cerumen, bilateral: Secondary | ICD-10-CM | POA: Insufficient documentation

## 2018-07-06 DIAGNOSIS — N3281 Overactive bladder: Secondary | ICD-10-CM | POA: Insufficient documentation

## 2018-07-06 DIAGNOSIS — F04 Amnestic disorder due to known physiological condition: Secondary | ICD-10-CM

## 2018-07-06 DIAGNOSIS — H70001 Acute mastoiditis without complications, right ear: Secondary | ICD-10-CM

## 2018-07-06 DIAGNOSIS — I10 Essential (primary) hypertension: Secondary | ICD-10-CM

## 2018-07-06 DIAGNOSIS — E118 Type 2 diabetes mellitus with unspecified complications: Secondary | ICD-10-CM

## 2018-07-06 LAB — CBC WITH DIFFERENTIAL/PLATELET
Basophils Absolute: 0 10*3/uL (ref 0.0–0.1)
Basophils Relative: 0.3 % (ref 0.0–3.0)
Eosinophils Absolute: 0 10*3/uL (ref 0.0–0.7)
Eosinophils Relative: 0.9 % (ref 0.0–5.0)
HCT: 43.4 % (ref 39.0–52.0)
Hemoglobin: 15.4 g/dL (ref 13.0–17.0)
Lymphocytes Relative: 26.8 % (ref 12.0–46.0)
Lymphs Abs: 1.2 10*3/uL (ref 0.7–4.0)
MCHC: 35.6 g/dL (ref 30.0–36.0)
MCV: 101.2 fl — ABNORMAL HIGH (ref 78.0–100.0)
Monocytes Absolute: 0.7 10*3/uL (ref 0.1–1.0)
Monocytes Relative: 14.5 % — ABNORMAL HIGH (ref 3.0–12.0)
Neutro Abs: 2.6 10*3/uL (ref 1.4–7.7)
Neutrophils Relative %: 57.5 % (ref 43.0–77.0)
Platelets: 67 10*3/uL — ABNORMAL LOW (ref 150.0–400.0)
RBC: 4.29 Mil/uL (ref 4.22–5.81)
RDW: 14.1 % (ref 11.5–15.5)
WBC: 4.6 10*3/uL (ref 4.0–10.5)

## 2018-07-06 LAB — HEPATIC FUNCTION PANEL
ALT: 28 U/L (ref 0–53)
AST: 58 U/L — ABNORMAL HIGH (ref 0–37)
Albumin: 3.9 g/dL (ref 3.5–5.2)
Alkaline Phosphatase: 107 U/L (ref 39–117)
Bilirubin, Direct: 0.5 mg/dL — ABNORMAL HIGH (ref 0.0–0.3)
Total Bilirubin: 1.2 mg/dL (ref 0.2–1.2)
Total Protein: 7.2 g/dL (ref 6.0–8.3)

## 2018-07-06 LAB — BASIC METABOLIC PANEL
BUN: 8 mg/dL (ref 6–23)
CO2: 30 mEq/L (ref 19–32)
Calcium: 9.1 mg/dL (ref 8.4–10.5)
Chloride: 100 mEq/L (ref 96–112)
Creatinine, Ser: 0.83 mg/dL (ref 0.40–1.50)
GFR: 90.64 mL/min (ref >60.00)
Glucose, Bld: 217 mg/dL — ABNORMAL HIGH (ref 70–99)
Potassium: 4.5 mEq/L (ref 3.5–5.1)
Sodium: 139 mEq/L (ref 135–145)

## 2018-07-06 LAB — PROTIME-INR
INR: 1.1 ratio — ABNORMAL HIGH (ref 0.8–1.0)
Prothrombin Time: 12.3 s (ref 9.6–13.1)

## 2018-07-06 LAB — SEDIMENTATION RATE: Sed Rate: 34 mm/hr — ABNORMAL HIGH (ref 0–20)

## 2018-07-06 LAB — HEMOGLOBIN A1C: Hgb A1c MFr Bld: 6.7 % — ABNORMAL HIGH (ref 4.6–6.5)

## 2018-07-06 MED ORDER — SOLIFENACIN SUCCINATE 5 MG PO TABS: 5.0000 mg | ORAL_TABLET | Freq: Every day | ORAL | 1 refills | Status: DC

## 2018-07-06 MED ORDER — CEFDINIR 300 MG PO CAPS: 300.0000 mg | ORAL_CAPSULE | Freq: Two times a day (BID) | ORAL | 0 refills | Status: DC

## 2018-07-06 NOTE — Progress Notes (Signed)
Subjective:  Patient ID: Patrick Kidd, male    DOB: 1945-07-02  Age: 73 y.o. MRN: [id number]  CC: Hypertension   HPI JOAL EAKLE presents for f/up - He complains of a 4-day history of pain behind his right ear with mild bilateral loss of hearing, nonproductive cough, sinus pain, nasal congestion, and runny nose.  He denies headache, ear pain, dizziness, lightheadedness, nausea, vomiting, rash, lymphadenopathy, fever, or chills.  Outpatient Medications Prior to Visit  Medication Sig Dispense Refill  . blood glucose meter kit and supplies KIT Use to test blood sugar once daily. DX E11.8 1 each 0  . Blood Glucose Monitoring Suppl (FREESTYLE FREEDOM LITE) w/Device KIT Use to check blood sugars 2 times daily 1 each 1  . gabapentin (NEURONTIN) 300 MG capsule TAKE 1 CAPSULE(300 MG) BY MOUTH FOUR TIMES DAILY 120 capsule 5  . glucose blood (COOL BLOOD GLUCOSE TEST STRIPS) test strip Use to check blood sugar daily. DX: E11.8 100 each 12  . HYDROcodone-acetaminophen (NORCO) 5-325 MG tablet Take 1 tablet by mouth every 4 (four) hours as needed for moderate pain. 10 tablet 0  . Lancets (ACCU-CHEK MULTICLIX) lancets Use to check sugar daily. DX: E11.8 100 each 12  . metFORMIN (GLUCOPHAGE-XR) 500 MG 24 hr tablet TAKE 1 TABLET(500 MG) BY MOUTH DAILY WITH BREAKFAST 90 tablet 1  . pantoprazole (PROTONIX) 40 MG tablet TAKE 1 TABLET(40 MG) BY MOUTH TWICE DAILY 180 tablet 0   No facility-administered medications prior to visit.     ROS Review of Systems  Constitutional: Negative.  Negative for chills, diaphoresis, fatigue and fever.  HENT: Positive for congestion, hearing loss, postnasal drip, rhinorrhea, sinus pressure and sinus pain. Negative for facial swelling, nosebleeds, sore throat, tinnitus and voice change.   Eyes: Negative.   Respiratory: Positive for cough. Negative for chest tightness, shortness of breath and wheezing.   Cardiovascular: Negative for chest pain, palpitations and leg  swelling.  Gastrointestinal: Negative for abdominal pain, constipation, diarrhea, nausea and vomiting.  Endocrine: Negative.   Genitourinary: Negative.  Negative for difficulty urinating.       He complains of urinary frequency throughout the day and night.  He is status post prostate surgery a few months ago.  He complains of the urge to urinate every 2-3 hours.  He denies dysuria, hematuria, or urgency.  He does not have any symptoms of obstructive uropathy.  Musculoskeletal: Negative.   Skin: Negative for color change and rash.  Neurological: Negative.  Negative for dizziness, weakness and headaches.  Hematological: Negative for adenopathy. Does not bruise/bleed easily.  Psychiatric/Behavioral: Negative.     Objective:  BP (!) 160/78 (BP Location: Left Arm, Patient Position: Sitting, Cuff Size: Large)   Pulse 92   Temp 98.8 F (37.1 C) (Oral)   Resp 16   Ht 6' 2"  (1.88 m)   Wt 250 lb (113.4 kg)   SpO2 94%   BMI 32.10 kg/m   BP Readings from Last 3 Encounters:  07/06/18 (!) 160/78  03/31/18 (!) 152/73  03/15/18 (!) 147/76    Wt Readings from Last 3 Encounters:  07/06/18 250 lb (113.4 kg)  03/31/18 249 lb 6.4 oz (113.1 kg)  03/15/18 250 lb 7 oz (113.6 kg)    Physical Exam Constitutional:      General: He is not in acute distress.    Appearance: He is obese. He is not ill-appearing, toxic-appearing or diaphoretic.  HENT:     Right Ear: Decreased hearing noted. No drainage, swelling  or tenderness. No middle ear effusion. There is impacted cerumen. There is mastoid tenderness. Tympanic membrane is not injected, scarred, perforated, erythematous, retracted or bulging.     Left Ear: Decreased hearing noted. No drainage, swelling or tenderness.  No middle ear effusion. There is impacted cerumen. No mastoid tenderness. Tympanic membrane is not injected, scarred, perforated, erythematous, retracted or bulging.     Ears:     Comments: I put Colace in both ears and then irrigated  them with water and used an ear pick to remove the cerumen.  After this he tells me his hearing has returned to normal.  The exam afterward shows normal TMs and normal EACs.    Nose: Nose normal. No congestion.     Mouth/Throat:     Mouth: Mucous membranes are moist.     Pharynx: No oropharyngeal exudate or posterior oropharyngeal erythema.  Eyes:     General: No scleral icterus.    Conjunctiva/sclera: Conjunctivae normal.  Neck:     Musculoskeletal: Normal range of motion and neck supple. No neck rigidity or muscular tenderness.  Cardiovascular:     Rate and Rhythm: Normal rate.     Heart sounds: No murmur. No gallop.   Pulmonary:     Effort: Pulmonary effort is normal.     Breath sounds: No stridor. No wheezing, rhonchi or rales.  Abdominal:     General: Abdomen is protuberant. Bowel sounds are normal.     Palpations: There is no hepatomegaly or splenomegaly.     Tenderness: There is no abdominal tenderness.     Hernia: No hernia is present.  Musculoskeletal: Normal range of motion.     Right lower leg: No edema.     Left lower leg: No edema.  Lymphadenopathy:     Cervical: No cervical adenopathy.  Skin:    General: Skin is warm and dry.     Coloration: Skin is not pale.     Findings: No rash.  Neurological:     General: No focal deficit present.     Mental Status: He is alert and oriented to person, place, and time. Mental status is at baseline.     Cranial Nerves: No cranial nerve deficit or dysarthria.     Sensory: Sensation is intact.     Motor: No weakness.     Coordination: Coordination abnormal (mild ataxia).     Gait: Gait abnormal.     Deep Tendon Reflexes: Reflexes normal.  Psychiatric:        Mood and Affect: Mood normal.        Behavior: Behavior normal.     Lab Results  Component Value Date   WBC 4.6 07/06/2018   HGB 15.4 07/06/2018   HCT 43.4 07/06/2018   PLT 67.0 (L) 07/06/2018   GLUCOSE 217 (H) 07/06/2018   CHOL 184 11/11/2017   TRIG 50.0  11/11/2017   HDL 105.40 11/11/2017   LDLCALC 68 11/11/2017   ALT 28 07/06/2018   AST 58 (H) 07/06/2018   NA 139 07/06/2018   K 4.5 07/06/2018   CL 100 07/06/2018   CREATININE 0.83 07/06/2018   BUN 8 07/06/2018   CO2 30 07/06/2018   TSH 1.63 11/11/2017   PSA 13.2 11/11/2017   INR 1.1 (H) 07/06/2018   HGBA1C 6.7 (H) 07/06/2018   MICROALBUR 1.3 10/10/2016    No results found.  Assessment & Plan:   Ramy was seen today for hypertension.  Diagnoses and all orders for this visit:  Mastoiditis, acute,  right- I will treat the infection with a broad-spectrum cephalosporin antibiotic. -     CBC with Differential/Platelet; Future -     Sedimentation rate; Future -     cefdinir (OMNICEF) 300 MG capsule; Take 1 capsule (300 mg total) by mouth 2 (two) times daily for 10 days.  Bilateral hearing loss due to cerumen impaction- This has resolved after the cerumen has been removed.  Type 2 diabetes mellitus with complication, with long-term current use of insulin ([place])- His blood sugar is adequately well controlled. 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  I am concerned he continues to drink alcohol.  I will monitor his thiamine level. -     Vitamin B1; Future  Alcoholic cirrhosis of liver without ascites ([place])- His LFTs remain mildly elevated but his PT/INR is stable.  He is scheduled for an MRI of his liver to screen for [facility]. -     Protime-INR; Future -     Hepatic function panel; Future  Essential hypertension- His blood pressure is not adequately well controlled.  He will improve his lifestyle modifications. -     Basic metabolic panel; Future  OAB (overactive bladder) -     solifenacin (VESICARE) 5 MG tablet; Take 1 tablet (5 mg total) by mouth daily.   I am having Hollice Espy B. Lucier "[name]" start on cefdinir  and solifenacin. I am also having him maintain his FreeStyle Freedom Lite, blood glucose meter kit and supplies, glucose blood, accu-chek multiclix, HYDROcodone-acetaminophen, gabapentin, pantoprazole, and metFORMIN.  Meds ordered this encounter  Medications  . cefdinir (OMNICEF) 300 MG capsule    Sig: Take 1 capsule (300 mg total) by mouth 2 (two) times daily for 10 days.    Dispense:  20 capsule    Refill:  0  . solifenacin (VESICARE) 5 MG tablet    Sig: Take 1 tablet (5 mg total) by mouth daily.    Dispense:  90 tablet    Refill:  1     Follow-up: Return in about 2 weeks (around 07/20/2018).  Scarlette Calico, MD

## 2018-07-06 NOTE — Patient Instructions (Signed)

## 2018-07-08 DIAGNOSIS — R3915 Urgency of urination: Secondary | ICD-10-CM | POA: Diagnosis not present

## 2018-07-08 DIAGNOSIS — R350 Frequency of micturition: Secondary | ICD-10-CM | POA: Diagnosis not present

## 2018-07-08 DIAGNOSIS — C61 Malignant neoplasm of prostate: Secondary | ICD-10-CM | POA: Diagnosis not present

## 2018-07-11 ENCOUNTER — Encounter: Payer: Self-pay | Admitting: Internal Medicine

## 2018-07-11 ENCOUNTER — Other Ambulatory Visit: Payer: Self-pay | Admitting: Internal Medicine

## 2018-07-11 DIAGNOSIS — E538 Deficiency of other specified B group vitamins: Secondary | ICD-10-CM

## 2018-07-11 LAB — VITAMIN B1: Vitamin B1 (Thiamine): <6 nmol/L — ABNORMAL LOW (ref 8–30)

## 2018-07-11 MED ORDER — VITAMIN B-1 50 MG PO TABS: 50.0000 mg | ORAL_TABLET | Freq: Every day | ORAL | 1 refills | Status: DC

## 2018-07-14 ENCOUNTER — Telehealth: Payer: Self-pay | Admitting: Internal Medicine

## 2018-07-14 NOTE — Telephone Encounter (Signed)
Copied from [place] [id number]. Topic: Quick Communication - See Telephone Encounter >> Jul 14, 2018  1:51 PM Robina Ade, Helene Kelp D wrote: CRM for notification. See Telephone encounter for: 07/14/18. Patient called and said that his ear pain has not gone away and wants to know if Dr. Ronnald Ramp can give him something stronger for it. Please call patient back. The pain is worse when lie down.

## 2018-07-15 MED ORDER — PREDNISONE 20 MG PO TABS: 40.0000 mg | ORAL_TABLET | Freq: Every day | ORAL | 0 refills | Status: DC

## 2018-07-15 NOTE — Addendum Note (Signed)
Addended by: Binnie Rail on: 07/15/2018 01:32 PM   Modules accepted: Orders

## 2018-07-15 NOTE — Telephone Encounter (Signed)
Patient states he completed round of abx (cefdinir) today---but has not felt one bit of relief with it---he has tingling and numbness, but no burning sensation---he has neck soreness from ear moving downward on his neck, and it feels a little "puffy" there, too----routing to dr burns, please advise, thanks

## 2018-07-15 NOTE — Addendum Note (Signed)
Addended by: Binnie Rail on: 07/15/2018 02:39 PM   Modules accepted: Orders

## 2018-07-15 NOTE — Telephone Encounter (Signed)
Can you get me more information - Dr Ronnald Ramp thought it may be an infection and he should almost be done with the antibiotic   what does the pain feel like?   Any burning/ numbness or tingling?  Does he have any neck pain?  Does anything seem to help the pain?

## 2018-07-15 NOTE — Telephone Encounter (Signed)
Revision to what I just sent you---patient wants ear checked again, he thinks its related to wax removal on 6/2---so he has made appt with dr burns for tomorrow 6/12

## 2018-07-15 NOTE — Progress Notes (Signed)
Subjective:    Patient ID: Patrick Kidd, male    DOB: 08/14/1945, 73 y.o.   MRN: [id number]  HPI The patient is here for an acute visit.   Ear pain, decreased hearing:  He was here 6/10 and had wax removed from his ears.  His hearing improved.  He was still having pain behind his right ear and was prescribed abx for possible mastoiditis  He has not seen a change in his symptoms.    His symptoms started about two weeks ago.  He developed headaches - he has a soreness in the posterior right base of skull.  The areas is puffy and he has tingling/ numbness and pain.  The pain radiates up the head toward the eye and toward the ear.  He denies any pain down the pain, into the chest or down the arm.  He denies any numnbess/tingling or weakness in the hands or arm.    His symptoms are worse when he lays down - he can not lay on his right side.    He denies any pain right now, but he still has the presence of the pain.  I started him on prednisone yesterday and he states it has helped, but he still has it.      Medications and allergies reviewed with patient and updated if appropriate.  Patient Active Problem List   Diagnosis Date Noted  . Mastoiditis, acute, right 07/06/2018  . OAB (overactive bladder) 07/06/2018  . Malignant neoplasm of prostate ([place]) 01/13/2018  . 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 Hyperlipidemia LDL goal <100 11/11/2017  . Essential hypertension 11/11/2017  . BPH associated with nocturia 11/11/2017  . Erectile dysfunction due to arterial insufficiency 04/07/2017  . Medicare annual wellness visit, subsequent 10/02/2016  . Peripheral vascular disease ([place]) 06/25/2016  . Alcoholic cirrhosis ([place]) 97/74/1423  . Liver disease 10/04/2015  . Type 2 diabetes mellitus with complication, with long-term current use of insulin ([place]) 08/22/2015  . Thrombocytopenia ([place]) 03/19/2014  . Hypersomnolence 03/19/2014  . Severe  obesity with body mass index (BMI) of 35.0 to 39.9 with comorbidity ([place]) 07/19/2013  . Peripheral neuropathy 01/11/2013  . Polyclonal gammopathy 01/11/2013  . GERD 10/04/2009    Current Outpatient Medications on File Prior to Visit  Medication Sig Dispense Refill  . blood glucose meter kit and supplies KIT Use to test blood sugar once daily. DX E11.8 1 each 0  . Blood Glucose Monitoring Suppl (FREESTYLE FREEDOM LITE) w/Device KIT Use to check blood sugars 2 times daily 1 each 1  . gabapentin (NEURONTIN) 300 MG capsule TAKE 1 CAPSULE(300 MG) BY MOUTH FOUR TIMES DAILY 120 capsule 5  . glucose blood (COOL BLOOD GLUCOSE TEST STRIPS) test strip Use to check blood sugar daily. DX: E11.8 100 each 12  . HYDROcodone-acetaminophen (NORCO) 5-325 MG tablet Take 1 tablet by mouth every 4 (four) hours as needed for moderate pain. 10 tablet 0  . Lancets (ACCU-CHEK MULTICLIX) lancets Use to check sugar daily. DX: E11.8 100 each 12  . metFORMIN (GLUCOPHAGE-XR) 500 MG 24 hr tablet TAKE 1 TABLET(500 MG) BY MOUTH DAILY WITH BREAKFAST 90 tablet 1  . pantoprazole (PROTONIX) 40 MG tablet TAKE 1 TABLET(40 MG) BY MOUTH TWICE DAILY 180 tablet 0  . predniSONE (DELTASONE) 20 MG tablet Take 2 tablets (40 mg total) by mouth daily with breakfast. 10 tablet 0  . solifenacin (VESICARE) 5 MG tablet Take 1 tablet (5 mg total) by mouth daily.  90 tablet 1  . thiamine (VITAMIN B-1) 50 MG tablet Take 1 tablet (50 mg total) by mouth daily. 90 tablet 1   No current facility-administered medications on file prior to visit.     Past Medical History:  Diagnosis Date  . Acrophobia   . Alcoholic cirrhosis ([place]) 9/37/9024  . Anemia   . Arthritis    foot by big toe  . BPH associated with nocturia   . Cataract    removed both eyes  . Chronic cough    PMH of  . Diabetes mellitus without complication ([place])   . Diverticulosis 07-03-2010   Colonoscopy.   . Duodenal ulcer 2017  . Fallen arches    Bilateral  . GERD  (gastroesophageal reflux disease)   . Gout   . Granuloma annulare   . Hx of adenomatous colonic polyps multiple  . Hydrocele 2011   Large septated right hydrocele  . Liver cyst   . Liver lesion   . Nonspecific elevation of levels of transaminase or lactic acid dehydrogenase (LDH)   . Obesity   . Peripheral neuropathy   . Plantar fasciitis    PMH of  . Portal hypertension ([place]) 2017  . Prostate cancer ([place])   . Right shoulder pain 11/2017  . Sleep apnea    no cpap, patient denies  . Wears reading eyeglasses     Past Surgical History:  Procedure Laterality Date  . CATARACT EXTRACTION, BILATERAL  12/2011    Dr Gershon Crane  . COLONOSCOPY  2017  . COLONOSCOPY W/ POLYPECTOMY  07/03/2010   2 adenomas, diverticulosis on right. Dr [name]  . CYSTOSCOPY N/A 03/15/2018   Procedure: CYSTOSCOPY FLEXIBLE;  Surgeon: [name], MD;  [facility];  Service: Urology;  Laterality: N/A;  NO SEEDS FOUND IN BLADDER  . ESOPHAGOGASTRODUODENOSCOPY (EGD) WITH PROPOFOL N/A 01/08/2016   Procedure: ESOPHAGOGASTRODUODENOSCOPY (EGD) WITH PROPOFOL;  Surgeon: [name], MD;  Location: [facility];  Service: Endoscopy;  Laterality: N/A;  . FLEXIBLE SIGMOIDOSCOPY  2000  . RADIOACTIVE SEED IMPLANT N/A 03/15/2018   Procedure: RADIOACTIVE SEED IMPLANT/BRACHYTHERAPY IMPLANT;  Surgeon: [name], MD;  Location: [facility];  Service: Urology;  Laterality: N/A;   72 SEEDS IMPLANTED  . SIGMOIDOSCOPY    . SPACE OAR INSTILLATION N/A 03/15/2018   Procedure: SPACE OAR INSTILLATION;  Surgeon: [name], MD;  Location[facility];  Service: Urology;  Laterality: N/A;  . WISDOM TOOTH EXTRACTION      Social History   Socioeconomic History  . Marital status: Married    Spouse name: Not on file  . Number of children: 2  . Years of education: Not on file  . Highest education level: Not on file  Occupational History  . Occupation: [employer]  Social Needs  . Financial resource strain: Not hard at all  . Food insecurity    Worry: Never true    Inability: Never true  . Transportation needs    Medical: No    Non-medical: No  Tobacco Use  . Smoking status: Former Smoker    Packs/day: 2.00    Years: 6.00    Pack years: 12.00    Types: Cigarettes    Quit date: 02/04/1971    Years since quitting: 47.4  . Smokeless tobacco: Never Used  . Tobacco comment: smoked age [age], up to 2 ppd  Substance and Sexual Activity  . Alcohol use: Yes  Alcohol/week: 28.0 standard drinks    Types: 28 Shots of liquor per week  . Drug use: No  . Sexual activity: Yes    Partners: Female  Lifestyle  . Physical activity    Days per week: 1 day    Minutes per session: 50 min  . Stress: Not at all  Relationships  . Social connections    Talks on phone: More than three times a week    Gets together: More than three times a week    Attends religious service: Not on file    Active member of club or organization: Not on file    Attends meetings of clubs or organizations: Not on file    Relationship status: Married  Other Topics Concern  . Not on file  Social History Narrative   Fun/Hobby: Play golf, grandchildren, [employer]    Family History  Problem Relation Age of Onset  . Lung cancer Mother        smoker  . Leukemia Father        Acute myelocytic  . Diabetes Neg Hx   . Stroke Neg Hx   . Heart disease Neg Hx   . Colon cancer Neg Hx   . Colon polyps Neg Hx   . Esophageal cancer Neg Hx   . Rectal cancer Neg Hx   . Stomach cancer Neg Hx     Review of Systems  Constitutional: Negative for chills and fever.  HENT: Negative for congestion, ear pain, sinus pressure and sore throat.   Respiratory: Negative for cough, shortness of breath and wheezing.   Neurological: Positive for numbness and headaches.       Objective:   Vitals:   07/16/18 1437  BP: 136/62  Pulse: 68  Resp: 16  Temp: 99.3 F (37.4 C)  SpO2: 95%    BP Readings from Last 3 Encounters:  07/16/18 136/62  07/06/18 (!) 160/78  03/31/18 (!) 152/73   Wt Readings from Last 3 Encounters:  07/16/18 248 lb 12.8 oz (112.9 kg)  07/06/18 250 lb (113.4 kg)  03/31/18 249 lb 6.4 oz (113.1 kg)   Body mass index is 31.94 kg/m.   Physical Exam Constitutional:      General: He is not in acute distress.    Appearance: Normal appearance. He is not ill-appearing.  HENT:     Head: Normocephalic and atraumatic.     Right Ear: Tympanic membrane, ear canal and external ear normal.     Left Ear: Tympanic membrane, ear canal and external ear normal.  Neck:     Musculoskeletal: Neck supple. No muscular tenderness.  Musculoskeletal:        General: Tenderness (focal area of rigth posterior skull) present.  Lymphadenopathy:     Cervical: No cervical adenopathy.  Skin:    General: Skin is warm and dry.     Findings: No rash.  Neurological:     Mental Status: He is alert.     Cranial Nerves: No cranial nerve deficit.     Sensory: No sensory deficit (in arms).     Motor: No weakness (in arms).            Assessment & Plan:    See Problem List for Assessment and Plan of chronic medical problems.

## 2018-07-15 NOTE — Telephone Encounter (Signed)
It sounds like a pinched nerve from his neck.  Is he still taking the gabapentin?  That may help.  He can try ice/heating the neck.   We can also try a short course of steroids but it may elevate his sugars   He may benefit from seeing Dr Tamala Julian.   Let me know if he is taking the gabapentin and wants to try steroids.

## 2018-07-15 NOTE — Telephone Encounter (Signed)
Routing to dr burns, he has continually taken gabapentin, no refill needed right now, he has plenty---and yes, could you send in steroids----he is also making appt with sports medicine----pharmacy is [facility] at [address]

## 2018-07-15 NOTE — Telephone Encounter (Signed)
Prednisone sent to pharmacy

## 2018-07-15 NOTE — Telephone Encounter (Signed)
Routing to dr burns, please advise in the absence of dr Ronnald Ramp, I will call patient back, thanks

## 2018-07-15 NOTE — Telephone Encounter (Signed)
lvm asking patient to call back and be transferred to [place] office to talk with Abbe Bula to get more information about ear pain, ok to transfer to Dywane Peruski,RN at [facility] office

## 2018-07-16 ENCOUNTER — Ambulatory Visit (INDEPENDENT_AMBULATORY_CARE_PROVIDER_SITE_OTHER)
Admission: RE | Admit: 2018-07-16 | Discharge: 2018-07-16 | Disposition: A | Discharge status: Home / Self Care | Payer: Medicare Other | Source: Ambulatory Visit | Attending: Internal Medicine | Admitting: Internal Medicine

## 2018-07-16 ENCOUNTER — Ambulatory Visit (INDEPENDENT_AMBULATORY_CARE_PROVIDER_SITE_OTHER): Payer: Medicare Other | Admitting: Internal Medicine

## 2018-07-16 ENCOUNTER — Encounter: Payer: Self-pay | Admitting: Internal Medicine

## 2018-07-16 ENCOUNTER — Other Ambulatory Visit: Payer: Self-pay

## 2018-07-16 VITALS — BP 136/62 | HR 68 | Temp 99.3°F | Resp 16 | Ht 74.0 in | Wt 248.8 lb

## 2018-07-16 DIAGNOSIS — M5412 Radiculopathy, cervical region: Secondary | ICD-10-CM

## 2018-07-16 DIAGNOSIS — M542 Cervicalgia: Secondary | ICD-10-CM | POA: Diagnosis not present

## 2018-07-16 NOTE — Patient Instructions (Signed)
Increase gabapentin to 2 pills (600 mg ) at night  Finish the steroids   Have an xray today.   See Dr Tamala Julian.

## 2018-07-16 NOTE — Assessment & Plan Note (Addendum)
Cervicogenic headache C2 distribution Complete steroids Ice posterior neck Increase gabapentin to 2 tabs at night  X-ray of neck Refer to Dr Tamala Julian

## 2018-07-17 ENCOUNTER — Encounter: Payer: Self-pay | Admitting: Internal Medicine

## 2018-07-20 NOTE — Progress Notes (Signed)
[name] [place], [place] [address] Phone: [phone] Subjective:   Patrick Kidd, am serving as a scribe for Dr. Hulan Saas.   CC: Neck pain follow-up  RSW:[id number]  Patrick Kidd is a 73 y.o. male coming in with complaint of neck pain on the right side for 2 weeks. Denies any radicular symptoms. Has been using prednisone but is done with medication. Pain character is dull and achy. Pain increases at night. Is having trouble sleeping.    Patient did have x-rays of the cervical spine that was independently visualized by me.  Significant arthritic changes with spondylosis and bilateral neural foraminal narrowing right greater than left at almost every level.  Past Medical History:  Diagnosis Date  . Acrophobia   . Alcoholic cirrhosis ([place]) 9/38/1829  . Anemia   . Arthritis    foot by big toe  . BPH associated with nocturia   . Cataract    removed both eyes  . Chronic cough    PMH of  . Diabetes mellitus without complication ([place])   . Diverticulosis 07-03-2010   Colonoscopy.   . Duodenal ulcer 2017  . Fallen arches    Bilateral  . GERD (gastroesophageal reflux disease)   . Gout   . Granuloma annulare   . Hx of adenomatous colonic polyps multiple  . Hydrocele 2011   Large septated right hydrocele  . Liver cyst   . Liver lesion   . Nonspecific elevation of levels of transaminase or lactic acid dehydrogenase (LDH)   . Obesity   . Peripheral neuropathy   . Plantar fasciitis    PMH of  . Portal hypertension ([place]) 2017  . Prostate cancer ([place])   . Right shoulder pain 11/2017  . Sleep apnea    Kidd cpap, patient denies  . Wears reading eyeglasses    Past Surgical History:  Procedure Laterality Date  . CATARACT EXTRACTION, BILATERAL  12/2011    Dr Gershon Crane  . COLONOSCOPY  2017  . COLONOSCOPY W/ POLYPECTOMY  07/03/2010   2 adenomas, diverticulosis on right. Dr [name]  . CYSTOSCOPY N/A 03/15/2018   Procedure:  CYSTOSCOPY FLEXIBLE;  Surgeon: [name], MD;  Location: [facility];  Service: Urology;  Laterality: N/A;  Kidd SEEDS FOUND IN BLADDER  . ESOPHAGOGASTRODUODENOSCOPY (EGD) WITH PROPOFOL N/A 01/08/2016   Procedure: ESOPHAGOGASTRODUODENOSCOPY (EGD) WITH PROPOFOL;  Surgeon: [name], MD;  Location[facility];  Service: Endoscopy;  Laterality: N/A;  . FLEXIBLE SIGMOIDOSCOPY  2000  . RADIOACTIVE SEED IMPLANT N/A 03/15/2018   Procedure: RADIOACTIVE SEED IMPLANT/BRACHYTHERAPY IMPLANT;  Surgeon: [name], MD;  Location[facility];  Service: Urology;  Laterality: N/A;   72 SEEDS IMPLANTED  . SIGMOIDOSCOPY    . SPACE OAR INSTILLATION N/A 03/15/2018   Procedure: SPACE OAR INSTILLATION;  Surgeon: [name], MD;  Location: [facility];  Service: Urology;  Laterality: N/A;  . WISDOM TOOTH EXTRACTION     Social History   Socioeconomic History  . Marital status: Married    Spouse name: Not on file  . Number of children: 2  . Years of education: Not on file  . Highest education level: Not on file  Occupational History  . Occupation: Adult nurse estate  Social Needs  . Financial resource strain: Not hard at all  . Food insecurity    Worry: Never true    Inability: Never true  .  Transportation needs    Medical: Kidd    Non-medical: Kidd  Tobacco Use  . Smoking status: Former Smoker    Packs/day: 2.00    Years: 6.00    Pack years: 12.00    Types: Cigarettes    Quit date: 02/04/1971    Years since quitting: 47.4  . Smokeless tobacco: Never Used  . Tobacco comment: smoked age [age], up to 2 ppd  Substance and Sexual Activity  . Alcohol use: Yes    Alcohol/week: 28.0 standard drinks    Types: 28 Shots of liquor per week  . Drug use: Kidd  . Sexual activity: Yes    Partners: Female  Lifestyle  . Physical activity    Days per week: 1 day    Minutes per session: 50 min  . Stress: Not at all  Relationships  . Social  connections    Talks on phone: More than three times a week    Gets together: More than three times a week    Attends religious service: Not on file    Active member of club or organization: Not on file    Attends meetings of clubs or organizations: Not on file    Relationship status: Married  Other Topics Concern  . Not on file  Social History Narrative   Fun/Hobby: Play golf, grandchildren, [employer]   Kidd Known Allergies Family History  Problem Relation Age of Onset  . Lung cancer Mother        smoker  . Leukemia Father        Acute myelocytic  . Diabetes Neg Hx   . Stroke Neg Hx   . Heart disease Neg Hx   . Colon cancer Neg Hx   . Colon polyps Neg Hx   . Esophageal cancer Neg Hx   . Rectal cancer Neg Hx   . Stomach cancer Neg Hx     Current Outpatient Medications (Endocrine & Metabolic):  .  metFORMIN (GLUCOPHAGE-XR) 500 MG 24 hr tablet, TAKE 1 TABLET(500 MG) BY MOUTH DAILY WITH BREAKFAST    Current Outpatient Medications (Analgesics):  .  HYDROcodone-acetaminophen (NORCO) 5-325 MG tablet, Take 1 tablet by mouth every 4 (four) hours as needed for moderate pain.   Current Outpatient Medications (Other):  .  blood glucose meter kit and supplies KIT, Use to test blood sugar once daily. DX E11.8 .  Blood Glucose Monitoring Suppl (FREESTYLE FREEDOM LITE) w/Device KIT, Use to check blood sugars 2 times daily .  gabapentin (NEURONTIN) 300 MG capsule, TAKE 1 CAPSULE(300 MG) BY MOUTH FOUR TIMES DAILY .  glucose blood (COOL BLOOD GLUCOSE TEST STRIPS) test strip, Use to check blood sugar daily. DX: E11.8 .  Lancets (ACCU-CHEK MULTICLIX) lancets, Use to check sugar daily. DX: E11.8 .  pantoprazole (PROTONIX) 40 MG tablet, TAKE 1 TABLET(40 MG) BY MOUTH TWICE DAILY .  thiamine (VITAMIN B-1) 50 MG tablet, Take 1 tablet (50 mg total) by mouth daily. Marland Kitchen  tolterodine (DETROL) 2 MG tablet, Take 1 tablet (2 mg total) by mouth 2 (two) times daily.    Past medical history, social, surgical  and family history all reviewed in electronic medical record.  Kidd pertanent information unless stated regarding to the chief complaint.   Review of Systems:  Kidd headache, visual changes, nausea, vomiting, diarrhea, constipation, dizziness, abdominal pain, skin rash, fevers, chills, night sweats, weight loss, swollen lymph nodes, body aches, joint swelling, muscle aches, chest pain, shortness of breath, mood changes.   Objective  Blood pressure  110/68, pulse (!) 102, height 6' 2"  (1.88 m), weight 243 lb (110.2 kg), SpO2 96 %.    General: Kidd apparent distress alert and oriented x3 mood and affect normal, dressed appropriately.  HEENT: Pupils equal, extraocular movements intact  Respiratory: Patient's speak in full sentences and does not appear short of breath  Cardiovascular: Kidd lower extremity edema, non tender, Kidd erythema  Abdomen: Soft nontender  Neuro: Cranial nerves II through XII are intact, neurovascularly intact in all extremities with 2+ DTRs and 1+ pulses.  Lymph: Kidd lymphadenopathy of posterior or anterior cervical chain or axillae bilaterally.  Gait antalgic MSK:  Non tender with full range of motion and good stability and symmetric strength and tone of shoulders, elbows, wrist, hip, knee and ankles bilaterally.  Mild arthritic changes Neck: Inspection loss of lordosis. Kidd palpable stepoffs. Positive Spurling's maneuver. Lacks last 3 degrees of extension but lacks 5 degrees of sidebending bilaterally Grip strength and sensation normal in bilateral hands Strength good C4 to T1 distribution Kidd sensory change to C4 to T1 Negative Hoffman sign bilaterally  Foot exam shows significant pes planus with overpronation of the hindfoot.  Arthritic changes.  Patient does have 1+ distal pulses noted.  Hemosiderin deposits noted  [id number]; 15 additional minutes spent for Therapeutic exercises as stated in above notes.  This included exercises focusing on stretching, strengthening, with  significant focus on eccentric aspects.   Long term goals include an improvement in range of motion, strength, endurance as well as avoiding reinjury. Patient's frequency would include in 1-2 times a day, 3-5 times a week for a duration of 6-12 weeks.  Proper technique shown and discussed handout in great detail with ATC.  All questions were discussed and answered.     Impression and Recommendations:     This case required medical decision making of moderate complexity. The above documentation has been reviewed and is accurate and complete Lyndal Pulley, DO       Note: This dictation was prepared with Dragon dictation along with smaller phrase technology. Any transcriptional errors that result from this process are unintentional.

## 2018-07-21 ENCOUNTER — Other Ambulatory Visit: Payer: Self-pay

## 2018-07-21 ENCOUNTER — Other Ambulatory Visit: Payer: Self-pay | Admitting: Internal Medicine

## 2018-07-21 ENCOUNTER — Ambulatory Visit (INDEPENDENT_AMBULATORY_CARE_PROVIDER_SITE_OTHER): Payer: Medicare Other | Admitting: Family Medicine

## 2018-07-21 ENCOUNTER — Encounter: Payer: Self-pay | Admitting: Family Medicine

## 2018-07-21 ENCOUNTER — Telehealth: Payer: Self-pay

## 2018-07-21 DIAGNOSIS — M5030 Other cervical disc degeneration, unspecified cervical region: Secondary | ICD-10-CM | POA: Diagnosis not present

## 2018-07-21 DIAGNOSIS — M5412 Radiculopathy, cervical region: Secondary | ICD-10-CM | POA: Diagnosis not present

## 2018-07-21 DIAGNOSIS — M2141 Flat foot [pes planus] (acquired), right foot: Secondary | ICD-10-CM | POA: Diagnosis not present

## 2018-07-21 DIAGNOSIS — M2142 Flat foot [pes planus] (acquired), left foot: Secondary | ICD-10-CM

## 2018-07-21 DIAGNOSIS — M2140 Flat foot [pes planus] (acquired), unspecified foot: Secondary | ICD-10-CM | POA: Insufficient documentation

## 2018-07-21 DIAGNOSIS — N3281 Overactive bladder: Secondary | ICD-10-CM

## 2018-07-21 MED ORDER — TOLTERODINE TARTRATE 2 MG PO TABS: 2.0000 mg | ORAL_TABLET | Freq: Two times a day (BID) | ORAL | 1 refills | Status: DC

## 2018-07-21 NOTE — Assessment & Plan Note (Signed)
Stable overall.  Discussed icing regimen and home exercises.  We discussed also about custom orthotics again which patient will be referred secondary to patient's body habitus will do better with the EVA foam.

## 2018-07-21 NOTE — Assessment & Plan Note (Signed)
Discussed arthritic changes.  Discussed which activities of doing which wants to avoid.  Discussed certain activities.  Discussed different medications which patient declined at the moment.  Encouraged him to continue the gabapentin.  Home exercises given for scapular stability follow-up in 4 to 8 weeks

## 2018-07-21 NOTE — Assessment & Plan Note (Signed)
Patient on exam today does not show any significant radicular symptoms.  Patient is not having any weakness of the upper extremities.  Do believe scapular dyskinesis and underlying degenerative disc disease is likely contributing to patient's most pain.  Patient wants to try conservative management with over-the-counter medications, home exercises for scapular stability and upper back strengthening.  Patient will try these and come back and see me again in 4 weeks

## 2018-07-21 NOTE — Patient Instructions (Signed)
Orthotics at [facility] with Dr. Tyson Babinski D 2000IU daily Tart cherry 1200 mg at nigth See me again in 4 weeks

## 2018-07-21 NOTE — Telephone Encounter (Signed)
Pt states that he can not afford Vesicare. Is there another alternative that can be prescribed?

## 2018-07-26 ENCOUNTER — Encounter: Payer: Self-pay | Admitting: Physical Therapy

## 2018-07-26 ENCOUNTER — Other Ambulatory Visit: Payer: Self-pay

## 2018-07-26 ENCOUNTER — Ambulatory Visit: Payer: Medicare Other | Attending: Family Medicine | Admitting: Physical Therapy

## 2018-07-26 DIAGNOSIS — R293 Abnormal posture: Secondary | ICD-10-CM | POA: Diagnosis not present

## 2018-07-26 DIAGNOSIS — M542 Cervicalgia: Secondary | ICD-10-CM | POA: Insufficient documentation

## 2018-07-26 NOTE — Therapy (Signed)
[place], [place], [address] Phone: [phone]   Fax:  [phone]  Physical Therapy Evaluation  Patient Details  Name: Patrick Kidd MRN: [id number] Date of Birth: [birth year] Referring Provider (PT): Lyndal Pulley, DO   Encounter Date: 07/26/2018  PT End of Session - 07/26/18 1052    Visit Number  1    Number of Visits  17    Date for PT Re-Evaluation  09/24/18    PT Start Time  1006    PT Stop Time  1050    PT Time Calculation (min)  44 min    Activity Tolerance  Patient tolerated treatment well    Behavior During Therapy  [facility] for tasks assessed/performed       Past Medical History:  Diagnosis Date  . Acrophobia   . Alcoholic cirrhosis ([place]) 07/05/930  . Anemia   . Arthritis    foot by big toe  . BPH associated with nocturia   . Cataract    removed both eyes  . Chronic cough    PMH of  . Diabetes mellitus without complication ([place])   . Diverticulosis 07-03-2010   Colonoscopy.   . Duodenal ulcer 2017  . Fallen arches    Bilateral  . GERD (gastroesophageal reflux disease)   . Gout   . Granuloma annulare   . Hx of adenomatous colonic polyps multiple  . Hydrocele 2011   Large septated right hydrocele  . Liver cyst   . Liver lesion   . Nonspecific elevation of levels of transaminase or lactic acid dehydrogenase (LDH)   . Obesity   . Peripheral neuropathy   . Plantar fasciitis    PMH of  . Portal hypertension ([place]) 2017  . Prostate cancer ([place])   . Right shoulder pain 11/2017  . Sleep apnea    no cpap, patient denies  . Wears reading eyeglasses     Past Surgical History:  Procedure Laterality Date  . CATARACT EXTRACTION, BILATERAL  12/2011    Dr Gershon Crane  . COLONOSCOPY  2017  . COLONOSCOPY W/ POLYPECTOMY  07/03/2010   2 adenomas, diverticulosis on right. Dr [name]  . CYSTOSCOPY N/A 03/15/2018   Procedure: CYSTOSCOPY FLEXIBLE;  Surgeon: [name], MD;   Location: [facility];  Service: Urology;  Laterality: N/A;  NO SEEDS FOUND IN BLADDER  . ESOPHAGOGASTRODUODENOSCOPY (EGD) WITH PROPOFOL N/A 01/08/2016   Procedure: ESOPHAGOGASTRODUODENOSCOPY (EGD) WITH PROPOFOL;  Surgeon: [name], MD;  [facility];  Service: Endoscopy;  Laterality: N/A;  . FLEXIBLE SIGMOIDOSCOPY  2000  . RADIOACTIVE SEED IMPLANT N/A 03/15/2018   Procedure: RADIOACTIVE SEED IMPLANT/BRACHYTHERAPY IMPLANT;  Surgeon: [name], MD;  Location: [facility];  Service: Urology;  Laterality: N/A;   73 SEEDS IMPLANTED  . SIGMOIDOSCOPY    . SPACE OAR INSTILLATION N/A 03/15/2018   Procedure: SPACE OAR INSTILLATION;  Surgeon: [name], MD;  Location: [facility];  Service: Urology;  Laterality: N/A;  . WISDOM TOOTH EXTRACTION      There were no vitals filed for this visit.   Subjective Assessment - 07/26/18 1010    Subjective  About 3 weeks began having HA- pointing to Rt suboccipital. they thought it was an ear infection and took antibiotics that did not help. xrays showed arthritis and doing exercises provided which have decreased pain. Balance is getting worse related to neuropathy.[name] once about 6 weeks ago and hit  my head, that is about when my head starting hurting- may have something to do with it.    Patient Stated Goals  decrease pain    Currently in Pain?  Yes    Pain Score  4     Pain Location  Neck    Pain Orientation  Right    Pain Descriptors / Indicators  Headache    Pain Radiating Towards  rt upper trap    Aggravating Factors   constant; laying on Rt side    Pain Relieving Factors  stretches         [facility] PT Assessment - 07/26/18 0001      Assessment   Medical Diagnosis  cervical radiculopathy    Referring Provider (PT)  Lyndal Pulley, DO    Onset Date/Surgical Date  --   3 weeks   Hand Dominance  Left    Prior Therapy  no      Precautions   Precautions  None       Restrictions   Weight Bearing Restrictions  No      Balance Screen   Has the patient fallen in the past 6 months  Yes    How many times?  1    Has the patient had a decrease in activity level because of a fear of falling?   Yes    Is the patient reluctant to leave their home because of a fear of falling?   No      Home Film/video editor residence    Living Arrangements  Spouse/significant other      Prior Function   Level of Independence  Independent    Vocation  Part time employment    Marketing executive   Overall Cognitive Status  Within Functional Limits for tasks assessed      Observation/Other Assessments   Focus on Therapeutic Outcomes (FOTO)   13% limited      Sensation   Additional Comments  N/t in bil feet; N/t into Rt upper trap      Posture/Postural Control   Posture Comments  forward head with rounded shoulders, increased thoracic kyphosis with decreased lumbar lordosis      ROM / Strength   AROM / PROM / Strength  AROM;Strength      AROM   AROM Assessment Site  Cervical    Cervical Flexion  46    Cervical - Right Side Bend  28   uncomfortable   Cervical - Left Side Bend  25    Cervical - Right Rotation  38    Cervical - Left Rotation  52      Strength   Overall Strength Comments  gross GHJ ROM 5/5    Strength Assessment Site  Shoulder    Right/Left Shoulder  Right;Left      Palpation   Palpation comment  concordant TTP at rt suboccipitals, hard end feel in passive Rt cervical rotation                Objective measurements completed on examination: See above findings.      [facility] Adult PT Treatment/Exercise - 07/26/18 0001      Exercises   Exercises  Neck      Pilates   Pilates Reformer  .      Neck Exercises: Seated   Other Seated Exercise  scapular retraction      Neck Exercises: Supine  Neck Retraction Limitations  chin tucks, on pillow      Neck Exercises:  Stretches   Other Neck Stretches  Rt rotation SNAG             PT Education - 07/26/18 1130    Education Details  anatomy of condiiton, POC, HEP, exercise form/rationale    Person(s) Educated  Patient    Methods  Explanation;Demonstration;Tactile cues;Verbal cues;Handout    Comprehension  Verbalized understanding;Returned demonstration;Verbal cues required;Tactile cues required;Need further instruction       PT Short Term Goals - 07/26/18 1131      PT SHORT TERM GOAL #1   Title  Pt will demo proper resting posture with scapular retraction without cues required    Baseline  tactile cues required at eval    Time  3    Period  Weeks    Status  New    Target Date  08/16/18      PT SHORT TERM GOAL #2   Title  Pt will demo cervical ROM Rt to Lt with no more than 5 degree discrepancy    Baseline  see flowsheet    Time  3    Period  Weeks    Status  New    Target Date  08/16/18        PT Long Term Goals - 07/26/18 1132      PT LONG TERM GOAL #1   Title  Pt will be independent with long term HEP for cervical & scapular musculature    Baseline  will progress and establish as appropriate    Time  8    Period  Weeks    Status  New    Target Date  09/24/18      PT LONG TERM GOAL #2   Title  Pt will be able to complete ADLs without limitation by neck pain    Baseline  significant pain at eval that worsens over the course of the day    Time  8    Period  Weeks    Status  New    Target Date  09/24/18             Plan - 07/26/18 1053    Clinical Impression Statement  Pt presents to PT with complaints of Rt-sided, suboccipital pain that began about 3 weeks ago. Recalls falling and hitting head about 6 weeks ago and thinks this may have something to do with the pain. Denies h/o cervical, shoulder, thoracic or HA pain. Good strength noted in periscapular region but poor scapular movement due to spasm on Rt side of cervical region. Pt has been told about dry needling and  would like to add this to POC. Utilized manual therapy today which was helpful. Pt is also very nervous about his balance and plans to contact MD to add treatment of balance to POC. pt will benefit from skilled PT to decrease spasm in cervical region to decrease pain and meet goals.    Personal Factors and Comorbidities  Comorbidity 1    Comorbidities  sleep apnea, GERD, arthritis    Examination-Activity Limitations  Reach Overhead;Bed Mobility;Sleep;Carry;Lift    Examination-Participation Restrictions  Community Activity;Driving    Stability/Clinical Decision Making  Stable/Uncomplicated    Clinical Decision Making  Low    Rehab Potential  Good    PT Frequency  2x / week    PT Duration  8 weeks    PT Treatment/Interventions  ADLs/Self Care Home Management;Cryotherapy;Electrical Stimulation;Traction;Moist Heat;Functional  mobility training;Therapeutic activities;Therapeutic exercise;Neuromuscular re-education;Manual techniques;Patient/family education;Passive range of motion;Dry needling;Taping    PT Next Visit Plan  DN Rt suboccipitals, chest stretch, supine band     *pt plans to get referral for balance ERO    PT Home Exercise Plan  scap retraction, supine chin tuck, cervical rotation SNAG    Consulted and Agree with Plan of Care  Patient       Patient will benefit from skilled therapeutic intervention in order to improve the following deficits and impairments:  Increased muscle spasms, Pain, Decreased activity tolerance, Impaired flexibility, Postural dysfunction, Decreased mobility  Visit Diagnosis: 1. Cervicalgia   2. Abnormal posture        Problem List Patient Active Problem List   Diagnosis Date Noted  . Degenerative disc disease, cervical 07/21/2018  . Pes planus 07/21/2018  . Cervical radiculopathy 07/16/2018  . Mastoiditis, acute, right 07/06/2018  . OAB (overactive bladder) 07/06/2018  . Malignant neoplasm of prostate ([place]) 01/13/2018  . Thiamine deficiency with  Wernicke-Korsakoff syndrome in adult [facility]) 11/16/2017  . Hyperlipidemia LDL goal <100 11/11/2017  . Essential hypertension 11/11/2017  . BPH associated with nocturia 11/11/2017  . Erectile dysfunction due to arterial insufficiency 04/07/2017  . Medicare annual wellness visit, subsequent 10/02/2016  . Peripheral vascular disease ([place]) 06/25/2016  . Alcoholic cirrhosis ([place]) 34/91/7915  . Liver disease 10/04/2015  . Type 2 diabetes mellitus with complication, with long-term current use of insulin ([place]) 08/22/2015  . Thrombocytopenia ([place]) 03/19/2014  . Hypersomnolence 03/19/2014  . Severe obesity with body mass index (BMI) of 35.0 to 39.9 with comorbidity ([place]) 07/19/2013  . Peripheral neuropathy 01/11/2013  . Polyclonal gammopathy 01/11/2013  . GERD 10/04/2009    Eddison Searls C. Nandana Krolikowski PT, DPT 07/26/18 11:36 AM   [facility] [address] [place], [place], [address] Phone: [phone]   Fax:  [phone]  Name: HALLIE ISHIDA MRN: [id number] Date of Birth: July 14, 1945

## 2018-07-27 ENCOUNTER — Encounter: Payer: Self-pay | Admitting: Internal Medicine

## 2018-07-27 ENCOUNTER — Ambulatory Visit
Admission: RE | Admit: 2018-07-27 | Discharge: 2018-07-27 | Disposition: A | Discharge status: Home / Self Care | Payer: Medicare Other | Source: Ambulatory Visit | Attending: Internal Medicine | Admitting: Internal Medicine

## 2018-07-27 DIAGNOSIS — K760 Fatty (change of) liver, not elsewhere classified: Secondary | ICD-10-CM

## 2018-07-27 DIAGNOSIS — K7030 Alcoholic cirrhosis of liver without ascites: Secondary | ICD-10-CM

## 2018-07-27 DIAGNOSIS — K7460 Unspecified cirrhosis of liver: Secondary | ICD-10-CM | POA: Diagnosis not present

## 2018-07-27 DIAGNOSIS — K769 Liver disease, unspecified: Secondary | ICD-10-CM

## 2018-07-27 MED ORDER — GADOBENATE DIMEGLUMINE 529 MG/ML IV SOLN
20.0000 mL | Freq: Once | INTRAVENOUS | Status: AC | PRN
  Administered 2018-07-27: 20 mL via INTRAVENOUS

## 2018-08-02 ENCOUNTER — Ambulatory Visit (INDEPENDENT_AMBULATORY_CARE_PROVIDER_SITE_OTHER): Payer: Medicare Other | Admitting: Family Medicine

## 2018-08-02 DIAGNOSIS — G6289 Other specified polyneuropathies: Secondary | ICD-10-CM

## 2018-08-02 NOTE — Patient Instructions (Signed)
Nice to meet you Please try the orthotics  Let me know if you need any adjustments or additional padding  Please send me a message in MyChart with any questions or updates. .   --Dr. Raeford Razor

## 2018-08-02 NOTE — Assessment & Plan Note (Signed)
Likely the source of his foot symptoms  - counseled on supportive care - counseled on padding   Patient was fitted for a standard, cushioned, semi-rigid orthotic. The orthotic was heated and afterward the patient stood on the orthotic blank positioned on the orthotic stand. The patient was positioned in subtalar neutral position and 10 degrees of ankle dorsiflexion in a weight bearing stance. After completion of molding, a stable base was applied to the orthotic blank. The blank was ground to a stable position for weight bearing. Size: 12 Base: Blue EVA Additional Posting and Padding: None The patient ambulated these, and they were very comfortable.

## 2018-08-02 NOTE — Progress Notes (Signed)
DELSHAWN STECH - 73 y.o. male MRN [id number]  Date of birth: 1945/09/13  SUBJECTIVE:  Including CC & ROS.  Chief Complaint  Patient presents with  . Foot Orthotics    MERCED HANNERS is a 73 y.o. male that is presenting with bilateral foot numbness related to diabetic neuropathy. He has pain on the right arch. Has degenerative changes of his left foot. Has had custom orthotics made in the past. Scaphoid pad placed on the right.     Review of Systems  Constitutional: Negative for fever.  HENT: Negative for congestion.   Respiratory: Negative for cough.   Cardiovascular: Negative for chest pain.  Gastrointestinal: Negative for abdominal pain.  Musculoskeletal: Positive for arthralgias and joint swelling.  Skin: Negative for color change.  Neurological: Negative for weakness.  Psychiatric/Behavioral: Negative for agitation.    HISTORY: Past Medical, Surgical, Social, and Family History Reviewed & Updated per EMR.   Pertinent Historical Findings include:  Past Medical History:  Diagnosis Date  . Acrophobia   . Alcoholic cirrhosis ([employer]) 2/69/4854  . Anemia   . Arthritis    foot by big toe  . BPH associated with nocturia   . Cataract    removed both eyes  . Chronic cough    PMH of  . Diabetes mellitus without complication ([facility])   . Diverticulosis 07-03-2010   Colonoscopy.   . Duodenal ulcer 2017  . Fallen arches    Bilateral  . GERD (gastroesophageal reflux disease)   . Gout   . Granuloma annulare   . Hx of adenomatous colonic polyps multiple  . Hydrocele 2011   Large septated right hydrocele  . Liver cyst   . Liver lesion   . Nonspecific elevation of levels of transaminase or lactic acid dehydrogenase (LDH)   . Obesity   . Peripheral neuropathy   . Plantar fasciitis    PMH of  . Portal hypertension ([place]) 2017  . Prostate cancer ([place])   . Right shoulder pain 11/2017  . Sleep apnea    no cpap, patient denies  . Wears reading eyeglasses     Past  Surgical History:  Procedure Laterality Date  . CATARACT EXTRACTION, BILATERAL  12/2011    Dr Gershon Crane  . COLONOSCOPY  2017  . COLONOSCOPY W/ POLYPECTOMY  07/03/2010   2 adenomas, diverticulosis on right. Dr [name]  . CYSTOSCOPY N/A 03/15/2018   Procedure: CYSTOSCOPY FLEXIBLE;  Surgeon: [name], MD;  Location: [facility];  Service: Urology;  Laterality: N/A;  NO SEEDS FOUND IN BLADDER  . ESOPHAGOGASTRODUODENOSCOPY (EGD) WITH PROPOFOL N/A 01/08/2016   Procedure: ESOPHAGOGASTRODUODENOSCOPY (EGD) WITH PROPOFOL;  Surgeon: [name], MD;  [facility];  Service: Endoscopy;  Laterality: N/A;  . FLEXIBLE SIGMOIDOSCOPY  2000  . RADIOACTIVE SEED IMPLANT N/A 03/15/2018   Procedure: RADIOACTIVE SEED IMPLANT/BRACHYTHERAPY IMPLANT;  Surgeon: [name], MD;  Location: [facility];  Service: Urology;  Laterality: N/A;   72 SEEDS IMPLANTED  . SIGMOIDOSCOPY    . SPACE OAR INSTILLATION N/A 03/15/2018   Procedure: SPACE OAR INSTILLATION;  Surgeon: [name], MD;  Location: [facility];  Service: Urology;  Laterality: N/A;  . WISDOM TOOTH EXTRACTION      No Known Allergies  Family History  Problem Relation Age of Onset  . Lung cancer Mother        smoker  . Leukemia Father        Acute myelocytic  .  Diabetes Neg Hx   . Stroke Neg Hx   . Heart disease Neg Hx   . Colon cancer Neg Hx   . Colon polyps Neg Hx   . Esophageal cancer Neg Hx   . Rectal cancer Neg Hx   . Stomach cancer Neg Hx      Social History   Socioeconomic History  . Marital status: Married    Spouse name: Not on file  . Number of children: 2  . Years of education: Not on file  . Highest education level: Not on file  Occupational History  . Occupation: Adult nurse estate  Social Needs  . Financial resource strain: Not hard at all  . Food insecurity    Worry: Never true    Inability: Never true  . Transportation needs    Medical: No     Non-medical: No  Tobacco Use  . Smoking status: Former Smoker    Packs/day: 2.00    Years: 6.00    Pack years: 12.00    Types: Cigarettes    Quit date: 02/04/1971    Years since quitting: 47.5  . Smokeless tobacco: Never Used  . Tobacco comment: smoked age [age], up to 2 ppd  Substance and Sexual Activity  . Alcohol use: Yes    Alcohol/week: 28.0 standard drinks    Types: 28 Shots of liquor per week  . Drug use: No  . Sexual activity: Yes    Partners: Female  Lifestyle  . Physical activity    Days per week: 1 day    Minutes per session: 50 min  . Stress: Not at all  Relationships  . Social connections    Talks on phone: More than three times a week    Gets together: More than three times a week    Attends religious service: Not on file    Active member of club or organization: Not on file    Attends meetings of clubs or organizations: Not on file    Relationship status: Married  . Intimate partner violence    Fear of current or ex partner: No    Emotionally abused: No    Physically abused: No    Forced sexual activity: No  Other Topics Concern  . Not on file  Social History Narrative   Fun/Hobby: Play golf, grandchildren, [employer]     PHYSICAL EXAM:  VS: There were no vitals taken for this visit. Physical Exam Gen: NAD, alert, cooperative with exam, well-appearing ENT: normal lips, normal nasal mucosa,  Eye: normal EOM, normal conjunctiva and lids CV:  no edema, +2 pedal pulses   Resp: no accessory muscle use, non-labored,  Skin: no rashes, no areas of induration  Neuro: normal tone, normal sensation to touch Psych:  normal insight, alert and oriented MSK:  Left and right foot:  Loss of transverse arch  Degenerative changes of the left 1st MTP joint  Normal ROM  Normal strength to resistance  Neurovascularly intact       ASSESSMENT & PLAN:   Peripheral neuropathy Likely the source of his foot symptoms  - counseled on supportive care - counseled on  padding   Patient was fitted for a standard, cushioned, semi-rigid orthotic. The orthotic was heated and afterward the patient stood on the orthotic blank positioned on the orthotic stand. The patient was positioned in subtalar neutral position and 10 degrees of ankle dorsiflexion in a weight bearing stance. After completion of molding, a stable base was applied to the orthotic  blank. The blank was ground to a stable position for weight bearing. Size: 12 Base: Blue EVA Additional Posting and Padding: None The patient ambulated these, and they were very comfortable.

## 2018-08-03 ENCOUNTER — Ambulatory Visit: Payer: Medicare Other | Admitting: Physical Therapy

## 2018-08-03 ENCOUNTER — Telehealth: Payer: Self-pay | Admitting: Internal Medicine

## 2018-08-03 DIAGNOSIS — M5412 Radiculopathy, cervical region: Secondary | ICD-10-CM

## 2018-08-03 NOTE — Telephone Encounter (Signed)
Send staff message to Selinda Eon for additional assistance.

## 2018-08-03 NOTE — Telephone Encounter (Signed)
Patrick Kidd at [facility] asked patient to request orders for balance therapy.

## 2018-08-04 NOTE — Telephone Encounter (Signed)
Patient is requesting to work on balance while in PT but they need an order entered to do so.   Okay to enter the order for the patient?

## 2018-08-04 NOTE — Telephone Encounter (Signed)
-----   Message from Selinda Eon, PT sent at 08/03/2018  5:50 PM EDT ----- Hi!  Yes, Mr Hillier would like to work on his balance while in PT but I would need a signed referral to do so.  ----- Message ----- From: Johney Frame, Adelina Mings, [place]: 08/03/2018   1:44 PM EDT To: Selinda Eon, PT  Good afternoon,   Mr. Wheller called and stated that you were needing orders (he did not specify details). Please let me know how I can be of assistance.

## 2018-08-04 NOTE — Telephone Encounter (Signed)
yes

## 2018-08-05 ENCOUNTER — Encounter: Payer: Self-pay | Admitting: Physical Therapy

## 2018-08-05 ENCOUNTER — Ambulatory Visit: Payer: Medicare Other | Attending: Family Medicine | Admitting: Physical Therapy

## 2018-08-05 ENCOUNTER — Other Ambulatory Visit: Payer: Self-pay

## 2018-08-05 DIAGNOSIS — R293 Abnormal posture: Secondary | ICD-10-CM

## 2018-08-05 DIAGNOSIS — R2689 Other abnormalities of gait and mobility: Secondary | ICD-10-CM

## 2018-08-05 DIAGNOSIS — R296 Repeated falls: Secondary | ICD-10-CM | POA: Insufficient documentation

## 2018-08-05 DIAGNOSIS — M542 Cervicalgia: Secondary | ICD-10-CM

## 2018-08-05 NOTE — Therapy (Signed)
[place], [place], [address] Phone: [phone]   Fax:  [phone]  Physical Therapy Treatment/Re-evaluation  Patient Details  Name: Patrick Kidd MRN: [id number] Date of Birth: [birth year] Referring Provider (PT): Gary Fleet; [name], MD   Encounter Date: 08/05/2018  PT End of Session - 08/05/18 1622    Visit Number  2    Number of Visits  17    Date for PT Re-Evaluation  09/24/18    PT Start Time  1620    PT Stop Time  1700    PT Time Calculation (min)  40 min    Equipment Utilized During Treatment  Gait belt    Activity Tolerance  Patient tolerated treatment well    Behavior During Therapy  [facility] for tasks assessed/performed       Past Medical History:  Diagnosis Date  . Acrophobia   . Alcoholic cirrhosis ([place]) 3/66/4403  . Anemia   . Arthritis    foot by big toe  . BPH associated with nocturia   . Cataract    removed both eyes  . Chronic cough    PMH of  . Diabetes mellitus without complication ([place])   . Diverticulosis 07-03-2010   Colonoscopy.   . Duodenal ulcer 2017  . Fallen arches    Bilateral  . GERD (gastroesophageal reflux disease)   . Gout   . Granuloma annulare   . Hx of adenomatous colonic polyps multiple  . Hydrocele 2011   Large septated right hydrocele  . Liver cyst   . Liver lesion   . Nonspecific elevation of levels of transaminase or lactic acid dehydrogenase (LDH)   . Obesity   . Peripheral neuropathy   . Plantar fasciitis    PMH of  . Portal hypertension ([place]) 2017  . Prostate cancer ([place])   . Right shoulder pain 11/2017  . Sleep apnea    no cpap, patient denies  . Wears reading eyeglasses     Past Surgical History:  Procedure Laterality Date  . CATARACT EXTRACTION, BILATERAL  12/2011    Dr Gershon Crane  . COLONOSCOPY  2017  . COLONOSCOPY W/ POLYPECTOMY  07/03/2010   2 adenomas, diverticulosis on right. Dr [name]  . CYSTOSCOPY N/A  03/15/2018   Procedure: CYSTOSCOPY FLEXIBLE;  Surgeon: [name], MD;  Location: [facility];  Service: Urology;  Laterality: N/A;  NO SEEDS FOUND IN BLADDER  . ESOPHAGOGASTRODUODENOSCOPY (EGD) WITH PROPOFOL N/A 01/08/2016   Procedure: ESOPHAGOGASTRODUODENOSCOPY (EGD) WITH PROPOFOL;  Surgeon: [name], MD;  [facility];  Service: Endoscopy;  Laterality: N/A;  . FLEXIBLE SIGMOIDOSCOPY  2000  . RADIOACTIVE SEED IMPLANT N/A 03/15/2018   Procedure: RADIOACTIVE SEED IMPLANT/BRACHYTHERAPY IMPLANT;  Surgeon: [name], MD;  Location: [facility];  Service: Urology;  Laterality: N/A;   73 SEEDS IMPLANTED  . SIGMOIDOSCOPY    . SPACE OAR INSTILLATION N/A 03/15/2018   Procedure: SPACE OAR INSTILLATION;  Surgeon: [name], MD;  Location: [facility];  Service: Urology;  Laterality: N/A;  . WISDOM TOOTH EXTRACTION      There were no vitals filed for this visit.  Subjective Assessment - 08/05/18 1623    Subjective  Neck is feeling better, still just a little numb. My skin is not numb but my feet are- I can feel temperature but not pressure. fallen a couple of times in last 6 months. When I  walk on stairs I have to have a rail for balance. Numbness to just above ankles.    Patient Stated Goals  decrease pain, golf, stairs, feel foot on pedal of car- is currently driving. balance in general, get out of pool without rail    Currently in Pain?  No/denies         [facility] PT Assessment - 08/05/18 0001      Assessment   Medical Diagnosis  cervical radiculopathy, poor balance    Referring Provider (PT)  Lyndal Pulley, DO; [name], MD    Onset Date/Surgical Date  --   chronic with worsening   Hand Dominance  Left    Prior Therapy  yes-for balance, not this year      Precautions   Precautions  Fall      Restrictions   Weight Bearing Restrictions  No      Balance Screen   Has the patient fallen in the past  6 months  Yes    How many times?  --   "a few"     [place] residence    Living Arrangements  Spouse/significant other      Prior Function   Level of [place]  Part time employment    Marketing executive   Overall Cognitive Status  Within Functional Limits for tasks assessed      Observation/Other Assessments   Focus on Therapeutic Outcomes (FOTO)   13% limited   Cervical     Sensation   Additional Comments  N/t in bil feet; N/t into Rt upper trap      Posture/Postural Control   Posture Comments  forward head with rounded shoulders, increased thoracic kyphosis with decreased lumbar lordosis      Balance   Balance Assessed  Yes      Standardized Balance Assessment   Standardized Balance Assessment  Berg Balance Test;Dynamic Gait Index      Berg Balance Test   Sit to Stand  Able to stand without using hands and stabilize independently    Standing Unsupported  Able to stand 2 minutes with supervision   30s stable   Sitting with Back Unsupported but Feet Supported on Floor or Stool  Able to sit safely and securely 2 minutes    Stand to Sit  Sits safely with minimal use of hands    Transfers  Able to transfer safely, minor use of hands    Standing Unsupported with Eyes Closed  Able to stand 10 seconds with supervision    Standing Unsupported with Feet Together  Able to place feet together independently and stand for 1 minute with supervision    From Standing, Reach Forward with Outstretched Arm  Can reach forward >5 cm safely (2")    From Standing Position, Pick up Object from Floor  Able to pick up shoe, needs supervision    From Standing Position, Turn to Look Behind Over each Shoulder  Looks behind one side only/other side shows less weight shift   turns Lt further   Turn 360 Degrees  Able to turn 360 degrees safely but slowly    Standing Unsupported, Alternately Place  Feet on Step/Stool  Able to complete 4 steps without aid or supervision    Standing Unsupported, One Foot in [employer] balance while stepping or standing    Standing on One Leg  Unable to try or needs assist to prevent fall    Total Score  37    Berg comment:  goal: increase to 42      Dynamic Gait Index   Level Surface  Mild Impairment    Change in Gait Speed  Moderate Impairment    Gait with Horizontal Head Turns  Moderate Impairment    Gait with Vertical Head Turns  Mild Impairment    Gait and Pivot Turn  Severe Impairment    Step Over Obstacle  Moderate Impairment    Step Around Obstacles  Mild Impairment    Steps  Mild Impairment    Total Score  11    DGI comment:  goal: score to at least 21                   [facility] Adult PT Treatment/Exercise - 08/05/18 0001      Exercises   Exercises  Knee/Hip      Knee/Hip Exercises: Stretches   Passive Hamstring Stretch  Both;30 seconds    Hip Flexor Stretch Limitations  at wall- runner stretch    Piriformis Stretch Limitations  seated figure 4      Knee/Hip Exercises: Standing   Other Standing Knee Exercises  church pew             PT Education - 08/05/18 1703    Education Details  balance tests, POC, pool balance/exercises    Person(s) Educated  Patient    Methods  Explanation    Comprehension  Verbalized understanding;Need further instruction       PT Short Term Goals - 07/26/18 1131      PT SHORT TERM GOAL #1   Title  Pt will demo proper resting posture with scapular retraction without cues required    Baseline  tactile cues required at eval    Time  3    Period  Weeks    Status  New    Target Date  08/16/18      PT SHORT TERM GOAL #2   Title  Pt will demo cervical ROM Rt to Lt with no more than 5 degree discrepancy    Baseline  see flowsheet    Time  3    Period  Weeks    Status  New    Target Date  08/16/18        PT Long Term Goals - 08/05/18 1706      PT LONG TERM GOAL #1   Title   Pt will be independent with long term HEP for cervical & scapular musculature    Baseline  will progress and establish as appropriate    Time  8    Period  Weeks    Status  New      PT LONG TERM GOAL #2   Title  Pt will be able to complete ADLs without limitation by neck pain    Baseline  significant pain at eval that worsens over the course of the day    Time  8    Period  Weeks    Status  New      PT LONG TERM GOAL #3   Title  BERG to improve by 5 points    Baseline  MDC 5 points    Time  8    Period  Weeks    Status  New    Target Date  09/24/18      PT LONG TERM GOAL #4  Title  DGI to improve to 21 points    Baseline  age appropriate range for 19s is 21-24 points    Time  8    Period  Weeks    Status  New    Target Date  09/24/18            Plan - 08/05/18 1703    Clinical Impression Statement  Re-evaluation to add balance deficits to PT POC. Pt demo significant limitations in balance control both statically and dynamically and will benefit from challenges to improve awareness. Was doing a chair yoga class but they are not having classes right now. advised on the importance of stretching. Pt has access to a pool where he will be able to do balance exercises for safety.    Stability/Clinical Decision Making  Stable/Uncomplicated    PT Treatment/Interventions  ADLs/Self Care Home Management;Cryotherapy;Electrical Stimulation;Traction;Moist Heat;Functional mobility training;Therapeutic activities;Therapeutic exercise;Neuromuscular re-education;Manual techniques;Patient/family education;Passive range of motion;Dry needling;Taping;Balance training;Stair training;Gait training    PT Next Visit Plan  DN PRN, balance challenges    PT Home Exercise Plan  scap retraction, supine chin tuck, cervical rotation SNAG; figure 4, HSS, wall hip flexor stretch, church pew, pool;    Consulted and Agree with Plan of Care  Patient       Patient will benefit from skilled therapeutic  intervention in order to improve the following deficits and impairments:  Increased muscle spasms, Pain, Decreased activity tolerance, Impaired flexibility, Postural dysfunction, Decreased mobility, Abnormal gait, Difficulty walking, Decreased balance, Impaired sensation  Visit Diagnosis: 1. Cervicalgia   2. Abnormal posture   3. Other abnormalities of gait and mobility   4. Repeated falls        Problem List Patient Active Problem List   Diagnosis Date Noted  . Degenerative disc disease, cervical 07/21/2018  . Pes planus 07/21/2018  . Cervical radiculopathy 07/16/2018  . Mastoiditis, acute, right 07/06/2018  . OAB (overactive bladder) 07/06/2018  . Malignant neoplasm of prostate ([place]) 01/13/2018  . 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 Hyperlipidemia LDL goal <100 11/11/2017  . Essential hypertension 11/11/2017  . BPH associated with nocturia 11/11/2017  . Erectile dysfunction due to arterial insufficiency 04/07/2017  . Medicare annual wellness visit, subsequent 10/02/2016  . Peripheral vascular disease ([place]) 06/25/2016  . Alcoholic cirrhosis ([place]) 75/88/3254  . Liver disease 10/04/2015  . Type 2 diabetes mellitus with complication, with long-term current use of insulin ([place]) 08/22/2015  . Thrombocytopenia ([place]) 03/19/2014  . Hypersomnolence 03/19/2014  . Severe obesity with body mass index (BMI) of 35.0 to 39.9 with comorbidity ([place]) 07/19/2013  . Peripheral neuropathy 01/11/2013  . Polyclonal gammopathy 01/11/2013  . GERD 10/04/2009   Ananda Caya C. Keevon Henney PT, DPT 08/05/18 5:16 PM   [place], [place], [address] Phone: [phone]   Fax:  [phone]  Name: Patrick Kidd MRN: [id number] Date of Birth: 20-Jun-1945

## 2018-08-05 NOTE — Telephone Encounter (Signed)
Order for PT with balance order added. Message to [place] of same.

## 2018-08-09 ENCOUNTER — Other Ambulatory Visit: Payer: Self-pay

## 2018-08-09 ENCOUNTER — Ambulatory Visit: Payer: Medicare Other | Admitting: Physical Therapy

## 2018-08-09 ENCOUNTER — Encounter: Payer: Self-pay | Admitting: Physical Therapy

## 2018-08-09 DIAGNOSIS — M542 Cervicalgia: Secondary | ICD-10-CM | POA: Diagnosis not present

## 2018-08-09 DIAGNOSIS — R2689 Other abnormalities of gait and mobility: Secondary | ICD-10-CM

## 2018-08-09 DIAGNOSIS — R296 Repeated falls: Secondary | ICD-10-CM

## 2018-08-09 DIAGNOSIS — R293 Abnormal posture: Secondary | ICD-10-CM

## 2018-08-09 NOTE — Therapy (Signed)
[place], [place], [address] Phone: [phone]   Fax:  [phone]  Physical Therapy Treatment  Patient Details  Name: Patrick Kidd MRN: [id number] Date of Birth: 02-06-1945 Referring Provider (PT): Gary Fleet; [name], MD   Encounter Date: 08/09/2018  PT End of Session - 08/09/18 1333    Visit Number  3    Number of Visits  17    Date for PT Re-Evaluation  09/24/18    PT Start Time  6546    PT Stop Time  1414    PT Time Calculation (min)  39 min    Activity Tolerance  Patient tolerated treatment well    Behavior During Therapy  [facility] for tasks assessed/performed       Past Medical History:  Diagnosis Date  . Acrophobia   . Alcoholic cirrhosis ([place]) 06/06/5463  . Anemia   . Arthritis    foot by big toe  . BPH associated with nocturia   . Cataract    removed both eyes  . Chronic cough    PMH of  . Diabetes mellitus without complication ([place])   . Diverticulosis 07-03-2010   Colonoscopy.   . Duodenal ulcer 2017  . Fallen arches    Bilateral  . GERD (gastroesophageal reflux disease)   . Gout   . Granuloma annulare   . Hx of adenomatous colonic polyps multiple  . Hydrocele 2011   Large septated right hydrocele  . Liver cyst   . Liver lesion   . Nonspecific elevation of levels of transaminase or lactic acid dehydrogenase (LDH)   . Obesity   . Peripheral neuropathy   . Plantar fasciitis    PMH of  . Portal hypertension ([place]) 2017  . Prostate cancer ([place])   . Right shoulder pain 11/2017  . Sleep apnea    no cpap, patient denies  . Wears reading eyeglasses     Past Surgical History:  Procedure Laterality Date  . CATARACT EXTRACTION, BILATERAL  12/2011    Dr Gershon Crane  . COLONOSCOPY  2017  . COLONOSCOPY W/ POLYPECTOMY  07/03/2010   2 adenomas, diverticulosis on right. Dr [name]  . CYSTOSCOPY N/A 03/15/2018   Procedure: CYSTOSCOPY FLEXIBLE;  Surgeon: [name], MD;  Location[facility];  Service: Urology;  Laterality: N/A;  NO SEEDS FOUND IN BLADDER  . ESOPHAGOGASTRODUODENOSCOPY (EGD) WITH PROPOFOL N/A 01/08/2016   Procedure: ESOPHAGOGASTRODUODENOSCOPY (EGD) WITH PROPOFOL;  Surgeon: [name], MD;  [facility];  Service: Endoscopy;  Laterality: N/A;  . FLEXIBLE SIGMOIDOSCOPY  2000  . RADIOACTIVE SEED IMPLANT N/A 03/15/2018   Procedure: RADIOACTIVE SEED IMPLANT/BRACHYTHERAPY IMPLANT;  Surgeon: [name], MD;  Location: [facility];  Service: Urology;  Laterality: N/A;   73 SEEDS IMPLANTED  . SIGMOIDOSCOPY    . SPACE OAR INSTILLATION N/A 03/15/2018   Procedure: SPACE OAR INSTILLATION;  Surgeon: [name], MD;  Location: [facility];  Service: Urology;  Laterality: N/A;  . WISDOM TOOTH EXTRACTION      There were no vitals filed for this visit.  Subjective Assessment - 08/09/18 1338    Subjective  Neck seems to be getting better.    Patient Stated Goals  decrease pain, golf, stairs, feel foot on pedal of car- is currently driving. balance in general, get out of pool without rail    Currently in Pain?  No/denies  [employer] Adult PT Treatment/Exercise - 08/09/18 0001      Knee/Hip Exercises: Stretches   Passive Hamstring Stretch  Both;30 seconds    Hip Flexor Stretch Limitations  runner stretch at // bars    Piriformis Stretch Limitations  seated figure 4    Gastroc Stretch  Both;30 seconds    Gastroc Stretch Limitations  slant board      Knee/Hip Exercises: Aerobic   Nustep  5 min L7 LE only      Knee/Hip Exercises: Standing   Heel Raises  20 reps    Heel Raises Limitations  cues to avoid arching    Rocker Board Limitations  a/p & lateral, static & dynamic    SLS  opp foot on wobble board keeping stable    Other Standing Knee Exercises  tap hurdles without knocking it over    Other Standing Knee Exercises  chops, fwd & OH  reach using pilates ring with NBOS               PT Short Term Goals - 07/26/18 1131      PT SHORT TERM GOAL #1   Title  Pt will demo proper resting posture with scapular retraction without cues required    Baseline  tactile cues required at eval    Time  3    Period  Weeks    Status  New    Target Date  08/16/18      PT SHORT TERM GOAL #2   Title  Pt will demo cervical ROM Rt to Lt with no more than 5 degree discrepancy    Baseline  see flowsheet    Time  3    Period  Weeks    Status  New    Target Date  08/16/18        PT Long Term Goals - 08/05/18 1706      PT LONG TERM GOAL #1   Title  Pt will be independent with long term HEP for cervical & scapular musculature    Baseline  will progress and establish as appropriate    Time  8    Period  Weeks    Status  New      PT LONG TERM GOAL #2   Title  Pt will be able to complete ADLs without limitation by neck pain    Baseline  significant pain at eval that worsens over the course of the day    Time  8    Period  Weeks    Status  New      PT LONG TERM GOAL #3   Title  BERG to improve by 5 points    Baseline  MDC 5 points    Time  8    Period  Weeks    Status  New    Target Date  09/24/18      PT LONG TERM GOAL #4   Title  DGI to improve to 21 points    Baseline  age appropriate range for 70s is 21-24 points    Time  8    Period  Weeks    Status  New    Target Date  09/24/18            Plan - 08/09/18 1417    Clinical Impression Statement  All balance exercises performed in parallel bars todaywith use of mirror for visual cues. Very limited in forward weight shift in anterior reach.    PT Treatment/Interventions  ADLs/Self Care Home Management;Cryotherapy;Electrical Stimulation;Traction;Moist Heat;Functional mobility training;Therapeutic activities;Therapeutic exercise;Neuromuscular re-education;Manual techniques;Patient/family education;Passive range of motion;Dry needling;Taping;Balance  training;Stair training;Gait training    PT Next Visit Plan  continue balance challenges, remove visual cues.    PT Home Exercise Plan  scap retraction, supine chin tuck, cervical rotation SNAG; figure 4, HSS, wall hip flexor stretch, church pew, pool; step taps, single leg step up & hold    Consulted and Agree with Plan of Care  Patient       Patient will benefit from skilled therapeutic intervention in order to improve the following deficits and impairments:  Increased muscle spasms, Pain, Decreased activity tolerance, Impaired flexibility, Postural dysfunction, Decreased mobility, Abnormal gait, Difficulty walking, Decreased balance, Impaired sensation  Visit Diagnosis: 1. Cervicalgia   2. Abnormal posture   3. Other abnormalities of gait and mobility   4. Repeated falls        Problem List Patient Active Problem List   Diagnosis Date Noted  . Degenerative disc disease, cervical 07/21/2018  . Pes planus 07/21/2018  . Cervical radiculopathy 07/16/2018  . Mastoiditis, acute, right 07/06/2018  . OAB (overactive bladder) 07/06/2018  . Malignant neoplasm of prostate ([place]) 01/13/2018  . 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 Hyperlipidemia LDL goal <100 11/11/2017  . Essential hypertension 11/11/2017  . BPH associated with nocturia 11/11/2017  . Erectile dysfunction due to arterial insufficiency 04/07/2017  . Medicare annual wellness visit, subsequent 10/02/2016  . Peripheral vascular disease ([place]) 06/25/2016  . Alcoholic cirrhosis ([place]) 01/75/1025  . Liver disease 10/04/2015  . Type 2 diabetes mellitus with complication, with long-term current use of insulin ([place]) 08/22/2015  . Thrombocytopenia ([place]) 03/19/2014  . Hypersomnolence 03/19/2014  . Severe obesity with body mass index (BMI) of 35.0 to 39.9 with comorbidity ([place]) 07/19/2013  . Peripheral neuropathy 01/11/2013  . Polyclonal gammopathy 01/11/2013  . GERD 10/04/2009     Tavien Chestnut C. Retal Tonkinson PT, DPT 08/09/18 2:20 PM   [facility] [address] [place], [place], [address] Phone: [phone]   Fax:  [phone]  Name: TYMOTHY CASS MRN: [id number] Date of Birth: [birth year]

## 2018-08-11 ENCOUNTER — Ambulatory Visit: Payer: Medicare Other

## 2018-08-13 ENCOUNTER — Encounter

## 2018-08-17 ENCOUNTER — Encounter: Payer: Self-pay | Admitting: Physical Therapy

## 2018-08-17 ENCOUNTER — Other Ambulatory Visit: Payer: Self-pay

## 2018-08-17 ENCOUNTER — Ambulatory Visit: Payer: Medicare Other | Admitting: Physical Therapy

## 2018-08-17 DIAGNOSIS — R293 Abnormal posture: Secondary | ICD-10-CM | POA: Diagnosis not present

## 2018-08-17 DIAGNOSIS — R2689 Other abnormalities of gait and mobility: Secondary | ICD-10-CM | POA: Diagnosis not present

## 2018-08-17 DIAGNOSIS — R296 Repeated falls: Secondary | ICD-10-CM

## 2018-08-17 DIAGNOSIS — M542 Cervicalgia: Secondary | ICD-10-CM | POA: Diagnosis not present

## 2018-08-17 NOTE — Therapy (Signed)
[place], [place], [address] Phone: [phone]   Fax:  [phone]  Physical Therapy Treatment  Patient Details  Name: Patrick Kidd MRN: [id number] Date of Birth: [birth year] Referring Provider (PT): Gary Fleet; [name], MD   Encounter Date: 08/17/2018  PT End of Session - 08/17/18 1425    Visit Number  4    Number of Visits  17    Date for PT Re-Evaluation  09/24/18    PT Start Time  9509    PT Stop Time  1425    PT Time Calculation (min)  40 min    Activity Tolerance  Patient tolerated treatment well    Behavior During Therapy  [facility] for tasks assessed/performed       Past Medical History:  Diagnosis Date  . Acrophobia   . Alcoholic cirrhosis ([place]) 04/29/7122  . Anemia   . Arthritis    foot by big toe  . BPH associated with nocturia   . Cataract    removed both eyes  . Chronic cough    PMH of  . Diabetes mellitus without complication ([place])   . Diverticulosis 07-03-2010   Colonoscopy.   . Duodenal ulcer 2017  . Fallen arches    Bilateral  . GERD (gastroesophageal reflux disease)   . Gout   . Granuloma annulare   . Hx of adenomatous colonic polyps multiple  . Hydrocele 2011   Large septated right hydrocele  . Liver cyst   . Liver lesion   . Nonspecific elevation of levels of transaminase or lactic acid dehydrogenase (LDH)   . Obesity   . Peripheral neuropathy   . Plantar fasciitis    PMH of  . Portal hypertension ([place]) 2017  . Prostate cancer ([place])   . Right shoulder pain 11/2017  . Sleep apnea    no cpap, patient denies  . Wears reading eyeglasses     Past Surgical History:  Procedure Laterality Date  . CATARACT EXTRACTION, BILATERAL  12/2011    Dr Gershon Crane  . COLONOSCOPY  2017  . COLONOSCOPY W/ POLYPECTOMY  07/03/2010   2 adenomas, diverticulosis on right. Dr [name]  . CYSTOSCOPY N/A 03/15/2018   Procedure: CYSTOSCOPY FLEXIBLE;  Surgeon: [name], MD;  Location: [facility];  Service: Urology;  Laterality: N/A;  NO SEEDS FOUND IN BLADDER  . ESOPHAGOGASTRODUODENOSCOPY (EGD) WITH PROPOFOL N/A 01/08/2016   Procedure: ESOPHAGOGASTRODUODENOSCOPY (EGD) WITH PROPOFOL;  Surgeon: [name], MD;  [facility];  Service: Endoscopy;  Laterality: N/A;  . FLEXIBLE SIGMOIDOSCOPY  2000  . RADIOACTIVE SEED IMPLANT N/A 03/15/2018   Procedure: RADIOACTIVE SEED IMPLANT/BRACHYTHERAPY IMPLANT;  Surgeon: [name], MD;  Location: [facility];  Service: Urology;  Laterality: N/A;   73 SEEDS IMPLANTED  . SIGMOIDOSCOPY    . SPACE OAR INSTILLATION N/A 03/15/2018   Procedure: SPACE OAR INSTILLATION;  Surgeon: [name], MD;  Location: [facility];  Service: Urology;  Laterality: N/A;  . WISDOM TOOTH EXTRACTION      There were no vitals filed for this visit.  Subjective Assessment - 08/17/18 1348    Subjective  It's progressing slowly. Up to about 22s SLS in pool, 5 ft of water.    Patient Stated Goals  decrease pain, golf, stairs, feel foot on pedal of car- is currently driving. balance in general, get out of pool without rail  Currently in Pain?  No/denies                       [facility] Adult PT Treatment/Exercise - 08/17/18 0001      Knee/Hip Exercises: Stretches   Passive Hamstring Stretch  Both;30 seconds    Passive Hamstring Stretch Limitations  seated EOB      Knee/Hip Exercises: Aerobic   Nustep  5 min L8 LE only      Knee/Hip Exercises: Machines for Strengthening   Cybex Leg Press  1 plate- double and single leg      Knee/Hip Exercises: Standing   SLS  3 point taps with opp foot, single UE assist    Other Standing Knee Exercises  cone taps- mirror for cues to increase dist bw heels    Other Standing Knee Exercises  church pew- mirror for [employer]      Knee/Hip Exercises: Seated   Other Seated Knee/Hip Exercises  seated BAPS L2, both feet                PT Short Term Goals - 07/26/18 1131      PT SHORT TERM GOAL #1   Title  Pt will demo proper resting posture with scapular retraction without cues required    Baseline  tactile cues required at eval    Time  3    Period  Weeks    Status  New    Target Date  08/16/18      PT SHORT TERM GOAL #2   Title  Pt will demo cervical ROM Rt to Lt with no more than 5 degree discrepancy    Baseline  see flowsheet    Time  3    Period  Weeks    Status  New    Target Date  08/16/18        PT Long Term Goals - 08/05/18 1706      PT LONG TERM GOAL #1   Title  Pt will be independent with long term HEP for cervical & scapular musculature    Baseline  will progress and establish as appropriate    Time  8    Period  Weeks    Status  New      PT LONG TERM GOAL #2   Title  Pt will be able to complete ADLs without limitation by neck pain    Baseline  significant pain at eval that worsens over the course of the day    Time  8    Period  Weeks    Status  New      PT LONG TERM GOAL #3   Title  BERG to improve by 5 points    Baseline  MDC 5 points    Time  8    Period  Weeks    Status  New    Target Date  09/24/18      PT LONG TERM GOAL #4   Title  DGI to improve to 21 points    Baseline  age appropriate range for 70s is 21-24 points    Time  8    Period  Weeks    Status  New    Target Date  09/24/18            Plan - 08/17/18 1419    Clinical Impression Statement  Assistance required for performance of BAPS board due to lack of coordination and sensation. Leg press to strengthen large muscle groups.  Will be out of town until next week.    PT Treatment/Interventions  ADLs/Self Care Home Management;Cryotherapy;Electrical Stimulation;Traction;Moist Heat;Functional mobility training;Therapeutic activities;Therapeutic exercise;Neuromuscular re-education;Manual techniques;Patient/family education;Passive range of motion;Dry needling;Taping;Balance training;Stair  training;Gait training    PT Next Visit Plan  BAPS board, gross strengthening- encourage awareness with movements    PT Home Exercise Plan  scap retraction, supine chin tuck, cervical rotation SNAG; figure 4, HSS, wall hip flexor stretch, church pew, pool; step taps, single leg step up & hold    Consulted and Agree with Plan of Care  Patient       Patient will benefit from skilled therapeutic intervention in order to improve the following deficits and impairments:  Increased muscle spasms, Pain, Decreased activity tolerance, Impaired flexibility, Postural dysfunction, Decreased mobility, Abnormal gait, Difficulty walking, Decreased balance, Impaired sensation  Visit Diagnosis: 1. Cervicalgia   2. Abnormal posture   3. Other abnormalities of gait and mobility   4. Repeated falls        Problem List Patient Active Problem List   Diagnosis Date Noted  . Degenerative disc disease, cervical 07/21/2018  . Pes planus 07/21/2018  . Cervical radiculopathy 07/16/2018  . Mastoiditis, acute, right 07/06/2018  . OAB (overactive bladder) 07/06/2018  . Malignant neoplasm of prostate ([place]) 01/13/2018  . 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 Hyperlipidemia LDL goal <100 11/11/2017  . Essential hypertension 11/11/2017  . BPH associated with nocturia 11/11/2017  . Erectile dysfunction due to arterial insufficiency 04/07/2017  . Medicare annual wellness visit, subsequent 10/02/2016  . Peripheral vascular disease ([place]) 06/25/2016  . Alcoholic cirrhosis ([place]) 11/09/1217  . Liver disease 10/04/2015  . Type 2 diabetes mellitus with complication, with long-term current use of insulin ([place]) 08/22/2015  . Thrombocytopenia ([place]) 03/19/2014  . Hypersomnolence 03/19/2014  . Severe obesity with body mass index (BMI) of 35.0 to 39.9 with comorbidity ([place]) 07/19/2013  . Peripheral neuropathy 01/11/2013  . Polyclonal gammopathy 01/11/2013  . GERD 10/04/2009     Valynn Schamberger C. Trinitie Mcgirr PT, DPT 08/17/18 3:11 PM   [facility] [address] [place], [place], [address] Phone: [phone]   Fax:  [phone]  Name: Patrick Kidd MRN: [id number] Date of Birth: [birth year]

## 2018-08-18 ENCOUNTER — Encounter: Payer: Self-pay | Admitting: Family Medicine

## 2018-08-18 ENCOUNTER — Ambulatory Visit (INDEPENDENT_AMBULATORY_CARE_PROVIDER_SITE_OTHER): Payer: Medicare Other | Admitting: Family Medicine

## 2018-08-18 DIAGNOSIS — M5030 Other cervical disc degeneration, unspecified cervical region: Secondary | ICD-10-CM

## 2018-08-18 MED ORDER — TOLTERODINE TARTRATE ER 2 MG PO CP24: 2.0000 mg | ORAL_CAPSULE | Freq: Every day | ORAL | 3 refills | Status: DC

## 2018-08-18 NOTE — Progress Notes (Signed)
[name] [place], [place] [address] Phone: [phone] Subjective:   Patrick Kidd, am serving as a scribe for Dr. Hulan Saas.  I'm seeing this patient by the request  of:  [name], MD   CC:   [id number]   07/21/2018 Discussed arthritic changes.  Discussed which activities of doing which wants to avoid.  Discussed certain activities.  Discussed different medications which patient declined at the moment.  Encouraged him to continue the gabapentin.  Home exercises given for scapular stability follow-up in 4 to 8 weeks  Update 08/18/2018: Patrick Kidd is a 73 y.o. male coming in with complaint of neck pain. States that his neck is much better. Does have numbness in right cervical spine at base of skull.  Patient states that it 90% percent better at this time.  Is in physical therapy and feels that this is helping. Sees them 2x a week.     Past Medical History:  Diagnosis Date  . Acrophobia   . Alcoholic cirrhosis ([place]) 0/27/2536  . Anemia   . Arthritis    foot by big toe  . BPH associated with nocturia   . Cataract    removed both eyes  . Chronic cough    PMH of  . Diabetes mellitus without complication ([place])   . Diverticulosis 07-03-2010   Colonoscopy.   . Duodenal ulcer 2017  . Fallen arches    Bilateral  . GERD (gastroesophageal reflux disease)   . Gout   . Granuloma annulare   . Hx of adenomatous colonic polyps multiple  . Hydrocele 2011   Large septated right hydrocele  . Liver cyst   . Liver lesion   . Nonspecific elevation of levels of transaminase or lactic acid dehydrogenase (LDH)   . Obesity   . Peripheral neuropathy   . Plantar fasciitis    PMH of  . Portal hypertension ([place]) 2017  . Prostate cancer ([place])   . Right shoulder pain 11/2017  . Sleep apnea    Kidd cpap, patient denies  . Wears reading eyeglasses    Past Surgical History:  Procedure Laterality Date  . CATARACT EXTRACTION,  BILATERAL  12/2011    Dr Gershon Crane  . COLONOSCOPY  2017  . COLONOSCOPY W/ POLYPECTOMY  07/03/2010   2 adenomas, diverticulosis on right. Dr [name]  . CYSTOSCOPY N/A 03/15/2018   Procedure: CYSTOSCOPY FLEXIBLE;  Surgeon: [name], MD;  Location: [facility];  Service: Urology;  Laterality: N/A;  Kidd SEEDS FOUND IN BLADDER  . ESOPHAGOGASTRODUODENOSCOPY (EGD) WITH PROPOFOL N/A 01/08/2016   Procedure: ESOPHAGOGASTRODUODENOSCOPY (EGD) WITH PROPOFOL;  Surgeon: [name], MD;  [facility];  Service: Endoscopy;  Laterality: N/A;  . FLEXIBLE SIGMOIDOSCOPY  2000  . RADIOACTIVE SEED IMPLANT N/A 03/15/2018   Procedure: RADIOACTIVE SEED IMPLANT/BRACHYTHERAPY IMPLANT;  Surgeon: [name], MD;  Location: [facility];  Service: Urology;  Laterality: N/A;   73 SEEDS IMPLANTED  . SIGMOIDOSCOPY    . SPACE OAR INSTILLATION N/A 03/15/2018   Procedure: SPACE OAR INSTILLATION;  Surgeon: [name], MD;  Location: [facility];  Service: Urology;  Laterality: N/A;  . WISDOM TOOTH EXTRACTION     Social History   Socioeconomic History  . Marital status: Married    Spouse name: Not on file  . Number of children: 2  . Years of education: Not on file  . Highest education  level: Not on file  Occupational History  . Occupation: Adult nurse estate  Social Needs  . Financial resource strain: Not hard at all  . Food insecurity    Worry: Never true    Inability: Never true  . Transportation needs    Medical: Kidd    Non-medical: Kidd  Tobacco Use  . Smoking status: Former Smoker    Packs/day: 2.00    Years: 6.00    Pack years: 12.00    Types: Cigarettes    Quit date: 02/04/1971    Years since quitting: 47.5  . Smokeless tobacco: Never Used  . Tobacco comment: smoked age [age], up to 2 ppd  Substance and Sexual Activity  . Alcohol use: Yes    Alcohol/week: 28.0 standard drinks    Types: 28 Shots of liquor per week  . Drug  use: Kidd  . Sexual activity: Yes    Partners: Female  Lifestyle  . Physical activity    Days per week: 1 day    Minutes per session: 50 min  . Stress: Not at all  Relationships  . Social connections    Talks on phone: More than three times a week    Gets together: More than three times a week    Attends religious service: Not on file    Active member of club or organization: Not on file    Attends meetings of clubs or organizations: Not on file    Relationship status: Married  Other Topics Concern  . Not on file  Social History Narrative   Fun/Hobby: Play golf, grandchildren, [employer]   Kidd Known Allergies Family History  Problem Relation Age of Onset  . Lung cancer Mother        smoker  . Leukemia Father        Acute myelocytic  . Diabetes Neg Hx   . Stroke Neg Hx   . Heart disease Neg Hx   . Colon cancer Neg Hx   . Colon polyps Neg Hx   . Esophageal cancer Neg Hx   . Rectal cancer Neg Hx   . Stomach cancer Neg Hx     Current Outpatient Medications (Endocrine & Metabolic):  .  metFORMIN (GLUCOPHAGE-XR) 500 MG 24 hr tablet, TAKE 1 TABLET(500 MG) BY MOUTH DAILY WITH BREAKFAST    Current Outpatient Medications (Analgesics):  .  HYDROcodone-acetaminophen (NORCO) 5-325 MG tablet, Take 1 tablet by mouth every 4 (four) hours as needed for moderate pain.   Current Outpatient Medications (Other):  .  blood glucose meter kit and supplies KIT, Use to test blood sugar once daily. DX E11.8 .  Blood Glucose Monitoring Suppl (FREESTYLE FREEDOM LITE) w/Device KIT, Use to check blood sugars 2 times daily .  gabapentin (NEURONTIN) 300 MG capsule, TAKE 1 CAPSULE(300 MG) BY MOUTH FOUR TIMES DAILY .  glucose blood (COOL BLOOD GLUCOSE TEST STRIPS) test strip, Use to check blood sugar daily. DX: E11.8 .  Lancets (ACCU-CHEK MULTICLIX) lancets, Use to check sugar daily. DX: E11.8 .  pantoprazole (PROTONIX) 40 MG tablet, TAKE 1 TABLET(40 MG) BY MOUTH TWICE DAILY .  thiamine (VITAMIN B-1) 50 MG  tablet, Take 1 tablet (50 mg total) by mouth daily. [place] Kitchen  tolterodine (DETROL LA) 2 MG 24 hr capsule, Take 1 capsule (2 mg total) by mouth daily.    Past medical history, social, surgical and family history all reviewed in electronic medical record.  Kidd pertanent information unless stated regarding to the chief complaint.   Review of  Systems:  Kidd headache, visual changes, nausea, vomiting, diarrhea, constipation, dizziness, abdominal pain, skin rash, fevers, chills, night sweats, weight loss, swollen lymph nodes, body aches, joint swelling, , chest pain, shortness of breath, mood changes.  Positive muscle aches, dysuria urinary incontinence  Objective  Blood pressure 110/60, pulse 96, height 6' 2"  (1.88 m), SpO2 96 %.    General: Kidd apparent distress alert and oriented x3 mood and affect normal, dressed appropriately.  HEENT: Pupils equal, extraocular movements intact  Respiratory: Patient's speak in full sentences and does not appear short of breath  Cardiovascular: Trace lower extremity edema, non tender, Kidd erythema  Skin: Warm dry intact with Kidd signs of infection or rash on extremities or on axial skeleton.  Abdomen: Soft distended Neuro: Cranial nerves II through XII are intact, neurovascularly intact in all extremities with 2+ DTRs and 1+ pulses distally of the lower extremities Lymph: Kidd lymphadenopathy of posterior or anterior cervical chain or axillae bilaterally.  Gait normal with good balance and coordination.  MSK:  tender with full range of motion and good stability and symmetric strength and tone of shoulders, elbows, wrist, hip, knee and ankles bilaterally.  Neck exam does show some loss of lordosis.  Range of motion with crepitus in all planes.  Negative Spurling's.  5-5 strength of the upper extremities.  Mild tenderness at the occipital region on the right side only.    Impression and Recommendations:     The above documentation has been reviewed and is accurate and  complete Lyndal Pulley, DO       Note: This dictation was prepared with Dragon dictation along with smaller phrase technology. Any transcriptional errors that result from this process are unintentional.

## 2018-08-18 NOTE — Patient Instructions (Signed)
Good to see you  detrol 2mg  daily to try for the bladder Happy with the neck and keep trucking along I like the orthotics See me ago when you needed [phone]

## 2018-08-18 NOTE — Assessment & Plan Note (Signed)
Discussed which activities of doing which wants to avoid.  Discussed home exercises, icing regimen, which activities to do which was employed.  Patient increase activity as tolerated.  Follow-up as needed.

## 2018-08-19 ENCOUNTER — Ambulatory Visit: Payer: Medicare Other | Admitting: Physical Therapy

## 2018-08-23 ENCOUNTER — Other Ambulatory Visit: Payer: Self-pay

## 2018-08-23 DIAGNOSIS — Z20822 Contact with and (suspected) exposure to covid-19: Secondary | ICD-10-CM

## 2018-08-23 NOTE — Progress Notes (Signed)
Virtual Visit via Video Note The purpose of this virtual visit is to provide medical care while limiting exposure to the novel coronavirus.    Consent was obtained for video visit:  Yes.   Answered questions that patient had about telehealth interaction:  Yes.   I discussed the limitations, risks, security and privacy concerns of performing an evaluation and management service by telemedicine. I also discussed with the patient that there may be a patient responsible charge related to this service. The patient expressed understanding and agreed to proceed.  Pt location: Home Physician Location: office Name of referring provider:  [name], MD I connected with Patrick Kidd at patients initiation/request on 08/24/2018 at  2:30 PM EDT by video enabled telemedicine application and verified that I am speaking with the correct person using two identifiers. Pt MRN:  [id number] Pt DOB:  [birth year] Video Participants:  Patrick Kidd   History of Present Illness:  Patrick Kidd is a 73 y.o. right-handed male who follows up for diabetic peripheral neuropathy.   UPDATE: He is taking gabapentin 337m twice daily.  Overall stable.  He denies pain, just numbness.  It has not progressed.  Hgb A1c from June was 6.7%.  Balance is a little worse.  He is in PT.    HISTORY: Onset of symptoms began in 2010. He describes initially experiencing numbness on the bottom of both feet, in which recently it has spread to the ankles. It is also associated with an uncomfortable tingling sensation. It is not painful but it is uncomfortable and usually bothers him at night. He doesn't really notice it if he is active. He is concerned about his balance. The first time, he was getting out of a boat and felt that his right leg was not strong enough to hold his weight and he fell to his knees. The second time, he tripped over a chair and fell down to his knees. He felt that his leg just gave  out when it paired his weight. He is particularly concerned, because he does enjoy riding on a boat and dancing. More recently, he stood up from a chair at a game and felt shaky and unsteady. He has not been exercising as much due to fear of imbalance. He denies any pain radiating down the legs. He has pain in his right big toe after an 8 pound frozen duck breast fell on it. He has tingling in the finger tips.  Labs from 08/04/12 revealed ESR 29, ANA negative, Lyme negative. SPEP revealed possible faint restricted band in the gamma region, IFE revealed polyclonal gammopathy involving IgA and IgM. Prior labs last month revealed Hgb A1c 5.7, TSH 2.42, RPR non-reactive, serum glucose 131. CBC and CMP otherwise unremarkable. B12 from 5/12 was 1293. Fasting glucose was 143 and 2 hour glucose tolerance test was 341. However, he was diagnosed with type 2 diabetes after Hgb A1c from 01/09/16 was found to be 8.4.  Past Medical History: Past Medical History:  Diagnosis Date  . Acrophobia   . [place]olic [place] 84/31/5400 . Anemia   . Arthritis    foot by big toe  . BPH associated with nocturia   . Cataract    removed both eyes  . Chronic cough    PMH of  . Diabetes mellitus without complication ([facility]   . Diverticulosis 07-03-2010   Colonoscopy.   . Duodenal ulcer 2017  . Fallen arches    Bilateral  . GERD (gastroesophageal reflux  disease)   . Gout   . Granuloma annulare   . Hx of adenomatous colonic polyps multiple  . Hydrocele 2011   Large septated right hydrocele  . Liver cyst   . Liver lesion   . Nonspecific elevation of levels of transaminase or lactic acid dehydrogenase (LDH)   . Obesity   . Peripheral neuropathy   . Plantar fasciitis    PMH of  . Portal hypertension ([place]) 2017  . Prostate cancer ([place])   . Right shoulder pain 11/2017  . Sleep apnea    no cpap, patient denies  . Wears reading eyeglasses     Medications: Outpatient Encounter Medications as of  08/24/2018  Medication Sig  . blood glucose meter kit and supplies KIT Use to test blood sugar once daily. DX E11.8  . Blood Glucose Monitoring Suppl (FREESTYLE FREEDOM LITE) w/Device KIT Use to check blood sugars 2 times daily  . gabapentin (NEURONTIN) 300 MG capsule Take 1 capsule (300 mg total) by mouth 2 (two) times daily.  Marland Kitchen glucose blood (COOL BLOOD GLUCOSE TEST STRIPS) test strip Use to check blood sugar daily. DX: E11.8  . Lancets (ACCU-CHEK MULTICLIX) lancets Use to check sugar daily. DX: E11.8  . metFORMIN (GLUCOPHAGE-XR) 500 MG 24 hr tablet TAKE 1 TABLET(500 MG) BY MOUTH DAILY WITH BREAKFAST  . pantoprazole (PROTONIX) 40 MG tablet TAKE 1 TABLET(40 MG) BY MOUTH TWICE DAILY  . [DISCONTINUED] gabapentin (NEURONTIN) 300 MG capsule TAKE 1 CAPSULE(300 MG) BY MOUTH FOUR TIMES DAILY (Patient taking differently: Take 300 mg by mouth 2 (two) times daily. )  . [DISCONTINUED] HYDROcodone-acetaminophen (NORCO) 5-325 MG tablet Take 1 tablet by mouth every 4 (four) hours as needed for moderate pain.  . [DISCONTINUED] thiamine (VITAMIN B-1) 50 MG tablet Take 1 tablet (50 mg total) by mouth daily.  . [DISCONTINUED] tolterodine (DETROL LA) 2 MG 24 hr capsule Take 1 capsule (2 mg total) by mouth daily.   No facility-administered encounter medications on file as of 08/24/2018.     Allergies: No Known Allergies  Family History: Family History  Problem Relation Age of Onset  . Lung cancer Mother        smoker  . Leukemia Father        Acute myelocytic  . Diabetes Neg Hx   . Stroke Neg Hx   . Heart disease Neg Hx   . Colon cancer Neg Hx   . Colon polyps Neg Hx   . Esophageal cancer Neg Hx   . Rectal cancer Neg Hx   . Stomach cancer Neg Hx     Social History: Social History   Socioeconomic History  . Marital status: Married    Spouse name: [name]  . Number of children: 2  . Years of education: Not on file  . Highest education level: Some college, no degree  Occupational History  .  Occupation: Adult nurse estate  Social Needs  . Financial resource strain: Not hard at all  . Food insecurity    Worry: Never true    Inability: Never true  . Transportation needs    Medical: No    Non-medical: No  Tobacco Use  . Smoking status: Former Smoker    Packs/day: 2.00    Years: 6.00    Pack years: 12.00    Types: Cigarettes    Quit date: 02/04/1971    Years since quitting: 47.5  . Smokeless tobacco: Never Used  . Tobacco comment: smoked age [age], up to 2 ppd  Substance  and Sexual Activity  . Alcohol use: Yes    Alcohol/week: 28.0 standard drinks    Types: 28 Shots of liquor per week    Comment: 2-3 vodka martinins a day  . Drug use: No  . Sexual activity: Yes    Partners: Female  Lifestyle  . Physical activity    Days per week: 1 day    Minutes per session: 50 min  . Stress: Not at all  Relationships  . Social connections    Talks on phone: More than three times a week    Gets together: More than three times a week    Attends religious service: Not on file    Active member of club or organization: Not on file    Attends meetings of clubs or organizations: Not on file    Relationship status: Married  . Intimate partner violence    Fear of current or ex partner: No    Emotionally abused: No    Physically abused: No    Forced sexual activity: No  Other Topics Concern  . Not on file  Social History Narrative   Fun/Hobby: Play golf, grandchildren, [employer]      Patient is left-handed. He lives with his wife in a one level home. He swims most days.    Observations/Objective:   Temperature 100 F (37.8 C), temperature source Oral, height 6' 2"  (1.88 m), weight 240 lb (108.9 kg). No acute distress.  Alert and oriented.  Speech fluent and not dysarthric.  Language intact.  Eyes orthophoric on primary gaze.  Face symmetric.  Assessment and Plan:   Diabetic polyneuropathy  Refilled gabapentin 379m twice daily Continue PT Follow up in one year  Follow Up  Instructions:    -I discussed the assessment and treatment plan with the patient. The patient was provided an opportunity to ask questions and all were answered. The patient agreed with the plan and demonstrated an understanding of the instructions.   The patient was advised to call back or seek an in-person evaluation if the symptoms worsen or if the condition fails to improve as anticipated.    Total Time spent in visit with the patient was:  15 minutes  ADudley Major DO

## 2018-08-24 ENCOUNTER — Telehealth (INDEPENDENT_AMBULATORY_CARE_PROVIDER_SITE_OTHER): Payer: Medicare Other | Admitting: Neurology

## 2018-08-24 ENCOUNTER — Other Ambulatory Visit: Payer: Self-pay

## 2018-08-24 ENCOUNTER — Ambulatory Visit: Payer: Medicare Other | Admitting: Physical Therapy

## 2018-08-24 ENCOUNTER — Encounter: Payer: Self-pay | Admitting: Neurology

## 2018-08-24 VITALS — Temp 100.0°F | Ht 74.0 in | Wt 240.0 lb

## 2018-08-24 DIAGNOSIS — E1142 Type 2 diabetes mellitus with diabetic polyneuropathy: Secondary | ICD-10-CM

## 2018-08-24 MED ORDER — GABAPENTIN 300 MG PO CAPS: 300.0000 mg | ORAL_CAPSULE | Freq: Two times a day (BID) | ORAL | 3 refills | Status: DC

## 2018-08-25 ENCOUNTER — Telehealth: Payer: Self-pay

## 2018-08-25 NOTE — Telephone Encounter (Signed)
Copied from [place] [phone]. Topic: Complaint - Staff >> Aug 25, 2018  1:25 PM Yvette Rack wrote: Date of Incident: 08/23/18 and 08/24/18 Details of complaint: Pt stated he would like Dr. Jenny Reichmann to know that Adelina Mings does not return calls as promised nor does she completed requests for medication refills. Pt stated if he has to deal with Stefannie he may have to find another health care provider. Pt went on to say he has had to deal with Stefannie's incompetence for 2 years now and he is tired of it. Attempted to transfer call to practice administrator but there was no answer. Pt requests a call back.  Route to Engineer, building services. >> Aug 25, 2018  1:45 PM Para Skeans A wrote: This is DR.Ronnald Ramp, not Dr.John's patient.   Routing to [employer]

## 2018-08-26 ENCOUNTER — Ambulatory Visit: Payer: Medicare Other | Admitting: Physical Therapy

## 2018-08-26 LAB — NOVEL CORONAVIRUS, NAA: SARS-CoV-2, NAA: NOT DETECTED

## 2018-08-26 NOTE — Telephone Encounter (Signed)
Spoke with pt, will follow up next week after talking with Stefannie. He is quite happy with his PCP and thinks there may be a personality conflict or misunderstanding that can be corrected with a conversation.  He has no current needs, meds are filled, COVID testing completed and no appt needed at this time.

## 2018-08-28 ENCOUNTER — Encounter: Payer: Self-pay | Admitting: Internal Medicine

## 2018-08-31 ENCOUNTER — Other Ambulatory Visit: Payer: Self-pay

## 2018-08-31 ENCOUNTER — Ambulatory Visit: Payer: Medicare Other | Admitting: Physical Therapy

## 2018-08-31 ENCOUNTER — Encounter: Payer: Self-pay | Admitting: Physical Therapy

## 2018-08-31 DIAGNOSIS — R293 Abnormal posture: Secondary | ICD-10-CM | POA: Diagnosis not present

## 2018-08-31 DIAGNOSIS — R2689 Other abnormalities of gait and mobility: Secondary | ICD-10-CM | POA: Diagnosis not present

## 2018-08-31 DIAGNOSIS — M542 Cervicalgia: Secondary | ICD-10-CM | POA: Diagnosis not present

## 2018-08-31 DIAGNOSIS — R296 Repeated falls: Secondary | ICD-10-CM

## 2018-08-31 NOTE — Therapy (Addendum)
[place], [place], [address] Phone: [phone]   Fax:  [phone]  Physical Therapy Treatment/Discharge  Patient Details  Name: Patrick Kidd MRN: [id number] Date of Birth: [birth year] Referring Provider (PT): Gary Fleet; [name], MD   Encounter Date: 08/31/2018  PT End of Session - 08/31/18 1305    Visit Number  5    Number of Visits  17    Date for PT Re-Evaluation  09/24/18    PT Start Time  0940    PT Stop Time  1344    PT Time Calculation (min)  39 min    Activity Tolerance  Patient tolerated treatment well    Behavior During Therapy  [facility] for tasks assessed/performed       Past Medical History:  Diagnosis Date  . Acrophobia   . Alcoholic cirrhosis ([place]) 7/68/0881  . Anemia   . Arthritis    foot by big toe  . BPH associated with nocturia   . Cataract    removed both eyes  . Chronic cough    PMH of  . Diabetes mellitus without complication ([place])   . Diverticulosis 07-03-2010   Colonoscopy.   . Duodenal ulcer 2017  . Fallen arches    Bilateral  . GERD (gastroesophageal reflux disease)   . Gout   . Granuloma annulare   . Hx of adenomatous colonic polyps multiple  . Hydrocele 2011   Large septated right hydrocele  . Liver cyst   . Liver lesion   . Nonspecific elevation of levels of transaminase or lactic acid dehydrogenase (LDH)   . Obesity   . Peripheral neuropathy   . Plantar fasciitis    PMH of  . Portal hypertension ([place]) 2017  . Prostate cancer ([place])   . Right shoulder pain 11/2017  . Sleep apnea    no cpap, patient denies  . Wears reading eyeglasses     Past Surgical History:  Procedure Laterality Date  . CATARACT EXTRACTION, BILATERAL  12/2011    Dr Gershon Crane  . COLONOSCOPY  2017  . COLONOSCOPY W/ POLYPECTOMY  07/03/2010   2 adenomas, diverticulosis on right. Dr [name]  . CYSTOSCOPY N/A 03/15/2018   Procedure: CYSTOSCOPY FLEXIBLE;  Surgeon:  [name], MD;  [facility];  Service: Urology;  Laterality: N/A;  NO SEEDS FOUND IN BLADDER  . ESOPHAGOGASTRODUODENOSCOPY (EGD) WITH PROPOFOL N/A 01/08/2016   Procedure: ESOPHAGOGASTRODUODENOSCOPY (EGD) WITH PROPOFOL;  Surgeon: [name], MD;  Location: [facility];  Service: Endoscopy;  Laterality: N/A;  . FLEXIBLE SIGMOIDOSCOPY  2000  . RADIOACTIVE SEED IMPLANT N/A 03/15/2018   Procedure: RADIOACTIVE SEED IMPLANT/BRACHYTHERAPY IMPLANT;  Surgeon: [name], MD;  Location: [facility];  Service: Urology;  Laterality: N/A;   73 SEEDS IMPLANTED  . SIGMOIDOSCOPY    . SPACE OAR INSTILLATION N/A 03/15/2018   Procedure: SPACE OAR INSTILLATION;  Surgeon: [name], MD;  [facility];  Service: Urology;  Laterality: N/A;  . WISDOM TOOTH EXTRACTION      There were no vitals filed for this visit.  Subjective Assessment - 08/31/18 1307    Subjective  Was really sick last week. Walked in the pool for 2 hours yesterday. Moving heavy rocks for a rock wall.    Patient Stated Goals  decrease pain, golf, stairs, feel foot on pedal of car- is currently driving. balance in general, get out of  pool without rail    Currently in Pain?  No/denies                       [facility] Adult PT Treatment/Exercise - 08/31/18 0001      Knee/Hip Exercises: Standing   Other Standing Knee Exercises  // bars with & without VC: tandem, long tandem, NBOS, wide BOS, fwd step/return, back step/return    Other Standing Knee Exercises  church pew      Knee/Hip Exercises: Seated   Other Seated Knee/Hip Exercises  seated BAPS L2, 1 foot at a time    Other Seated Knee/Hip Exercises  foot on ball circles- with & wihtout visual cues    Marching Limitations  line of cones, tap to reach- visual cues & without               PT Short Term Goals - 07/26/18 1131      PT SHORT TERM GOAL #1   Title  Pt will demo proper  resting posture with scapular retraction without cues required    Baseline  tactile cues required at eval    Time  3    Period  Weeks    Status  New    Target Date  08/16/18      PT SHORT TERM GOAL #2   Title  Pt will demo cervical ROM Rt to Lt with no more than 5 degree discrepancy    Baseline  see flowsheet    Time  3    Period  Weeks    Status  New    Target Date  08/16/18        PT Long Term Goals - 08/05/18 1706      PT LONG TERM GOAL #1   Title  Pt will be independent with long term HEP for cervical & scapular musculature    Baseline  will progress and establish as appropriate    Time  8    Period  Weeks    Status  New      PT LONG TERM GOAL #2   Title  Pt will be able to complete ADLs without limitation by neck pain    Baseline  significant pain at eval that worsens over the course of the day    Time  8    Period  Weeks    Status  New      PT LONG TERM GOAL #3   Title  BERG to improve by 5 points    Baseline  MDC 5 points    Time  8    Period  Weeks    Status  New    Target Date  09/24/18      PT LONG TERM GOAL #4   Title  DGI to improve to 21 points    Baseline  age appropriate range for 70s is 21-24 points    Time  8    Period  Weeks    Status  New    Target Date  09/24/18            Plan - 08/31/18 1347    Clinical Impression Statement  Demonstrating improved confidence and control with balance exercises which pt reports he does notice. Asked him to work with EO & EC in pool. Tends to return to NBOS with bil LE turnout and encouraged him to ambulate with wider BOS.    PT Treatment/Interventions  ADLs/Self Care Home Management;Cryotherapy;Electrical Stimulation;Traction;Moist Heat;Functional mobility training;Therapeutic  activities;Therapeutic exercise;Neuromuscular re-education;Manual techniques;Patient/family education;Passive range of motion;Dry needling;Taping;Balance training;Stair training;Gait training    PT Next Visit Plan  hip  strengthening CKC, balance board    PT Home Exercise Plan  scap retraction, supine chin tuck, cervical rotation SNAG; figure 4, HSS, wall hip flexor stretch, church pew, pool; step taps, single leg step up & hold    Consulted and Agree with Plan of Care  Patient       Patient will benefit from skilled therapeutic intervention in order to improve the following deficits and impairments:  Increased muscle spasms, Pain, Decreased activity tolerance, Impaired flexibility, Postural dysfunction, Decreased mobility, Abnormal gait, Difficulty walking, Decreased balance, Impaired sensation  Visit Diagnosis: 1. Cervicalgia   2. Abnormal posture   3. Other abnormalities of gait and mobility   4. Repeated falls        Problem List Patient Active Problem List   Diagnosis Date Noted  . Degenerative disc disease, cervical 07/21/2018  . Pes planus 07/21/2018  . Cervical radiculopathy 07/16/2018  . Mastoiditis, acute, right 07/06/2018  . OAB (overactive bladder) 07/06/2018  . Malignant neoplasm of prostate ([place]) 01/13/2018  . 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 Hyperlipidemia LDL goal <100 11/11/2017  . Essential hypertension 11/11/2017  . BPH associated with nocturia 11/11/2017  . Erectile dysfunction due to arterial insufficiency 04/07/2017  . Medicare annual wellness visit, subsequent 10/02/2016  . Peripheral vascular disease ([place]) 06/25/2016  . Alcoholic cirrhosis ([place]) 34/96/1164  . Liver disease 10/04/2015  . Type 2 diabetes mellitus with complication, with long-term current use of insulin ([place]) 08/22/2015  . Thrombocytopenia ([place]) 03/19/2014  . Hypersomnolence 03/19/2014  . Severe obesity with body mass index (BMI) of 35.0 to 39.9 with comorbidity ([place]) 07/19/2013  . Peripheral neuropathy 01/11/2013  . Polyclonal gammopathy 01/11/2013  . GERD 10/04/2009    Priyansh Pry C. Tiffanny Lamarche PT, DPT 08/31/18 1:50 PM   [facility] Outpatient  Rehabilitation [facility] [address] [place], [place], [address] Phone: [phone]   Fax:  [phone]  Name: DWAIN HUHN MRN: [id number] Date of Birth: 1945-09-09  PHYSICAL THERAPY DISCHARGE SUMMARY  Visits from Start of Care: 5  Current functional level related to goals / functional outcomes: See above   Remaining deficits: See above   Education / Equipment: Anatomy of condition, POC ,HEP, exercise form/rationale  Plan: Patient agrees to discharge.  Patient goals were not met. Patient is being discharged due to not returning since the last visit.  ?????     Chau Sawin C. Aunisty Reali PT, DPT 10/05/18 1:30 PM

## 2018-09-01 ENCOUNTER — Telehealth: Payer: Self-pay

## 2018-09-01 NOTE — Telephone Encounter (Signed)
LVM for pt to call back as soon as possible.   RE: I need to speak to pt if/when he calls back.

## 2018-09-01 NOTE — Telephone Encounter (Signed)
Stefannie talked with pt, issue resolved amicably.

## 2018-09-01 NOTE — Telephone Encounter (Signed)
Spoke to pt. We discussed the reason why he felt that I neglected him. He would of liked that I called him back directly when he calls. Pt does not like being on hold when he calls the office. I did inform that he is always welcome to send me a direct mychart message to let me know he wants me to call him. Pt issue has been resolved amicably.

## 2018-10-07 DIAGNOSIS — C61 Malignant neoplasm of prostate: Secondary | ICD-10-CM | POA: Diagnosis not present

## 2018-10-07 LAB — PSA: PSA: 0.84

## 2018-10-14 DIAGNOSIS — C61 Malignant neoplasm of prostate: Secondary | ICD-10-CM | POA: Diagnosis not present

## 2018-10-14 DIAGNOSIS — R351 Nocturia: Secondary | ICD-10-CM | POA: Diagnosis not present

## 2018-10-14 DIAGNOSIS — R3912 Poor urinary stream: Secondary | ICD-10-CM | POA: Diagnosis not present

## 2018-10-14 DIAGNOSIS — R3121 Asymptomatic microscopic hematuria: Secondary | ICD-10-CM | POA: Diagnosis not present

## 2018-10-14 DIAGNOSIS — R3915 Urgency of urination: Secondary | ICD-10-CM | POA: Diagnosis not present

## 2018-11-22 ENCOUNTER — Encounter: Payer: Self-pay | Admitting: Internal Medicine

## 2018-11-22 ENCOUNTER — Ambulatory Visit (INDEPENDENT_AMBULATORY_CARE_PROVIDER_SITE_OTHER): Payer: Medicare Other | Admitting: Internal Medicine

## 2018-11-22 ENCOUNTER — Other Ambulatory Visit: Payer: Self-pay

## 2018-11-22 ENCOUNTER — Other Ambulatory Visit (INDEPENDENT_AMBULATORY_CARE_PROVIDER_SITE_OTHER): Payer: Medicare Other

## 2018-11-22 ENCOUNTER — Ambulatory Visit: Payer: Self-pay | Admitting: *Deleted

## 2018-11-22 VITALS — BP 144/70 | HR 116 | Temp 99.2°F | Resp 16 | Ht 74.0 in | Wt 248.0 lb

## 2018-11-22 DIAGNOSIS — R531 Weakness: Secondary | ICD-10-CM

## 2018-11-22 DIAGNOSIS — Z794 Long term (current) use of insulin: Secondary | ICD-10-CM

## 2018-11-22 DIAGNOSIS — R42 Dizziness and giddiness: Secondary | ICD-10-CM

## 2018-11-22 DIAGNOSIS — E118 Type 2 diabetes mellitus with unspecified complications: Secondary | ICD-10-CM

## 2018-11-22 DIAGNOSIS — W19XXXA Unspecified fall, initial encounter: Secondary | ICD-10-CM

## 2018-11-22 LAB — COMPREHENSIVE METABOLIC PANEL
ALT: 22 U/L (ref 0–53)
AST: 39 U/L — ABNORMAL HIGH (ref 0–37)
Albumin: 4.1 g/dL (ref 3.5–5.2)
Alkaline Phosphatase: 93 U/L (ref 39–117)
BUN: 13 mg/dL (ref 6–23)
CO2: 29 mEq/L (ref 19–32)
Calcium: 9.1 mg/dL (ref 8.4–10.5)
Chloride: 99 mEq/L (ref 96–112)
Creatinine, Ser: 0.92 mg/dL (ref 0.40–1.50)
GFR: 80.4 mL/min (ref >60.00)
Glucose, Bld: 233 mg/dL — ABNORMAL HIGH (ref 70–99)
Potassium: 4.2 mEq/L (ref 3.5–5.1)
Sodium: 138 mEq/L (ref 135–145)
Total Bilirubin: 3.8 mg/dL — ABNORMAL HIGH (ref 0.2–1.2)
Total Protein: 7 g/dL (ref 6.0–8.3)

## 2018-11-22 LAB — CBC WITH DIFFERENTIAL/PLATELET
Basophils Absolute: 0 10*3/uL (ref 0.0–0.1)
Basophils Relative: 0.3 % (ref 0.0–3.0)
Eosinophils Absolute: 0.1 10*3/uL (ref 0.0–0.7)
Eosinophils Relative: 1.6 % (ref 0.0–5.0)
HCT: 45.2 % (ref 39.0–52.0)
Hemoglobin: 15.8 g/dL (ref 13.0–17.0)
Lymphocytes Relative: 22.2 % (ref 12.0–46.0)
Lymphs Abs: 1.2 10*3/uL (ref 0.7–4.0)
MCHC: 34.9 g/dL (ref 30.0–36.0)
MCV: 103 fl — ABNORMAL HIGH (ref 78.0–100.0)
Monocytes Absolute: 0.8 10*3/uL (ref 0.1–1.0)
Monocytes Relative: 13.8 % — ABNORMAL HIGH (ref 3.0–12.0)
Neutro Abs: 3.5 10*3/uL (ref 1.4–7.7)
Neutrophils Relative %: 62.1 % (ref 43.0–77.0)
Platelets: 56 10*3/uL — ABNORMAL LOW (ref 150.0–400.0)
RBC: 4.39 Mil/uL (ref 4.22–5.81)
RDW: 14.2 % (ref 11.5–15.5)
WBC: 5.6 10*3/uL (ref 4.0–10.5)

## 2018-11-22 LAB — HEMOGLOBIN A1C: Hgb A1c MFr Bld: 6.7 % — ABNORMAL HIGH (ref 4.6–6.5)

## 2018-11-22 NOTE — Patient Instructions (Addendum)
Start taking Thiamine (vitamin B-1) 100 mg daily.  Have blood work done today.    Make sure you are drinking a lot of fluids today and eating well today.    If your weakness and lightheadedness does not improve please let us know.

## 2018-11-22 NOTE — Telephone Encounter (Signed)
  Patient  And wife are calling to report that patient fell in the bathroom on Saturday and has injured his knees. Patient has been using his wife's walker for support. Patient complains of trouble getting to standing position- initial 8 inches/weightbearing to stand is the most difficult. No pain- but weakness and soreness. Reason for Disposition . [1] High-risk adult (e.g., age > 41, osteoporosis, chronic steroid use) AND [2] limping  Answer Assessment - Initial Assessment Questions 1. MECHANISM: "How did the injury happen?" (e.g., twisting injury, direct blow)      Slipped on bathroom mat in the bathroom 2. ONSET: "When did the injury happen?" (Minutes or hours ago)      5 am Saturday morning 3. LOCATION: "Where is the injury located?"      Both knees 4. APPEARANCE of INJURY: "What does the injury look like?"      R knee may be swelling- when up and moving around 5. SEVERITY: "Can you put weight on that leg?" "Can you walk?"      Using walker to get around- not able to walk on his on- short distance only. Patient gets weak 6. SIZE: For cuts, bruises, or swelling, ask: "How large is it?" (e.g., inches or centimeters;  entire joint)      no 7. PAIN: "Is there pain?" If so, ask: "How bad is the pain?"    (e.g., Scale 1-10; or mild, moderate, severe)     No- soreness and weakness 8. TETANUS: For any breaks in the skin, ask: "When was the last tetanus booster?"     n/a 9. OTHER SYMPTOMS: "Do you have any other symptoms?"  (e.g., "pop" when knee injured, swelling, locking, buckling)      no 10. PREGNANCY: "Is there any chance you are pregnant?" "When was your last menstrual period?"       n/a  Protocols used: KNEE INJURY-A-AH

## 2018-11-22 NOTE — Assessment & Plan Note (Addendum)
Likely mechanical - tripped over a mat - has neuropathy Head trauma occurred while trying to get up - no headaches or confusion, not on a blood thinner - no need for imaging His balance is very poor - currently using a walker Follows with Dr Tomi Likens

## 2018-11-22 NOTE — Assessment & Plan Note (Addendum)
Generalized weakness since the fall - none prior He drank alcohol the night prior - he has alcoholic cirrhosis  Likely dehydration is contributing - had diarrhea yesterday Cbc, cmp Increase fluids If symptoms do not improve call/return

## 2018-11-22 NOTE — Progress Notes (Signed)
Subjective:    Patient ID: Patrick Kidd, male    DOB: 1945-12-16, 73 y.o.   MRN: [id number]  HPI The patient is here for an acute visit.  He has not been taking his metformin for over a month.    Two days ago he got up at 5 am to go to the bathroom.  He thinks he tripped over the bathroom mat, but is not 100% sure.  He does not recall exactly what happened.  He did hit his head, but did that when he was trying to get up.  He denied lightheadedness, palpitations, sob and chest pain prior to or after the fall. Since the fall he has had generalized weakness, been more shaky and fatigued.  He can not walk 100 feet w/o sitting down. He started using a walker.   He also fell sat night - he was getting into bed - he was using a walker and was not used to it.  His posterior knees hurt and feel too weak to hold his weight - this is new since the fall.  The difficulty is getting out of a chair the first 6-8 inches - once he gets past this he is ok.   Friday he felt fine - he had 8 people for dinner. He states he did drink alcohol that night, but "not that much".    Diarrhea all day yesterday and a lot of gas.  He took imodium.  He has intermittent diarrhea on occasion and take imodium.  He did not drink or eat very much yesterday.      Medications and allergies reviewed with patient and updated if appropriate.  Patient Active Problem List   Diagnosis Date Noted  . Degenerative disc disease, cervical 07/21/2018  . Pes planus 07/21/2018  . Cervical radiculopathy 07/16/2018  . Mastoiditis, acute, right 07/06/2018  . OAB (overactive bladder) 07/06/2018  . Malignant neoplasm of prostate ([place]) 01/13/2018  . 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 Hyperlipidemia LDL goal <100 11/11/2017  . Essential hypertension 11/11/2017  . BPH associated with nocturia 11/11/2017  . Erectile dysfunction due to arterial insufficiency 04/07/2017  . Medicare annual  wellness visit, subsequent 10/02/2016  . Peripheral vascular disease ([place]) 06/25/2016  . Alcoholic cirrhosis ([place]) 68/12/5724  . Liver disease 10/04/2015  . Type 2 diabetes mellitus with complication, with long-term current use of insulin ([place]) 08/22/2015  . Thrombocytopenia ([place]) 03/19/2014  . Hypersomnolence 03/19/2014  . Severe obesity with body mass index (BMI) of 35.0 to 39.9 with comorbidity ([place]) 07/19/2013  . Peripheral neuropathy 01/11/2013  . Polyclonal gammopathy 01/11/2013  . GERD 10/04/2009    Current Outpatient Medications on File Prior to Visit  Medication Sig Dispense Refill  . blood glucose meter kit and supplies KIT Use to test blood sugar once daily. DX E11.8 1 each 0  . Blood Glucose Monitoring Suppl (FREESTYLE FREEDOM LITE) w/Device KIT Use to check blood sugars 2 times daily 1 each 1  . gabapentin (NEURONTIN) 300 MG capsule Take 1 capsule (300 mg total) by mouth 2 (two) times daily. 180 capsule 3  . glucose blood (COOL BLOOD GLUCOSE TEST STRIPS) test strip Use to check blood sugar daily. DX: E11.8 100 each 12  . Lancets (ACCU-CHEK MULTICLIX) lancets Use to check sugar daily. DX: E11.8 100 each 12  . metFORMIN (GLUCOPHAGE-XR) 500 MG 24 hr tablet TAKE 1 TABLET(500 MG) BY MOUTH DAILY WITH BREAKFAST 90 tablet 1  . pantoprazole (PROTONIX) 40  MG tablet TAKE 1 TABLET(40 MG) BY MOUTH TWICE DAILY 180 tablet 0   No current facility-administered medications on file prior to visit.     Past Medical History:  Diagnosis Date  . Acrophobia   . Alcoholic cirrhosis ([place]) 09/16/4816  . Anemia   . Arthritis    foot by big toe  . BPH associated with nocturia   . Cataract    removed both eyes  . Chronic cough    PMH of  . Diabetes mellitus without complication ([place])   . Diverticulosis 07-03-2010   Colonoscopy.   . Duodenal ulcer 2017  . Fallen arches    Bilateral  . GERD (gastroesophageal reflux disease)   . Gout   . Granuloma annulare   . Hx of adenomatous colonic  polyps multiple  . Hydrocele 2011   Large septated right hydrocele  . Liver cyst   . Liver lesion   . Nonspecific elevation of levels of transaminase or lactic acid dehydrogenase (LDH)   . Obesity   . Peripheral neuropathy   . Plantar fasciitis    PMH of  . Portal hypertension ([place]) 2017  . Prostate cancer ([place])   . Right shoulder pain 11/2017  . Sleep apnea    no cpap, patient denies  . Wears reading eyeglasses     Past Surgical History:  Procedure Laterality Date  . CATARACT EXTRACTION, BILATERAL  12/2011    Dr Gershon Crane  . COLONOSCOPY  2017  . COLONOSCOPY W/ POLYPECTOMY  07/03/2010   2 adenomas, diverticulosis on right. Dr [name]  . CYSTOSCOPY N/A 03/15/2018   Procedure: CYSTOSCOPY FLEXIBLE;  Surgeon: [name], MD;  Location: [facility];  Service: Urology;  Laterality: N/A;  NO SEEDS FOUND IN BLADDER  . ESOPHAGOGASTRODUODENOSCOPY (EGD) WITH PROPOFOL N/A 01/08/2016   Procedure: ESOPHAGOGASTRODUODENOSCOPY (EGD) WITH PROPOFOL;  Surgeon: [name], MD;  Location: [facility];  Service: Endoscopy;  Laterality: N/A;  . FLEXIBLE SIGMOIDOSCOPY  2000  . RADIOACTIVE SEED IMPLANT N/A 03/15/2018   Procedure: RADIOACTIVE SEED IMPLANT/BRACHYTHERAPY IMPLANT;  Surgeon: [name], MD;  Location[facility];  Service: Urology;  Laterality: N/A;   73 SEEDS IMPLANTED  . SIGMOIDOSCOPY    . SPACE OAR INSTILLATION N/A 03/15/2018   Procedure: SPACE OAR INSTILLATION;  Surgeon: [name], MD;  Location: [facility];  Service: Urology;  Laterality: N/A;  . WISDOM TOOTH EXTRACTION      Social History   Socioeconomic History  . Marital status: Married    Spouse name: [name]  . Number of children: 2  . Years of education: Not on file  . Highest education level: Some college, no degree  Occupational History  . Occupation: Adult nurse estate  Social Needs  . Financial resource strain: Not hard at all  . Food  insecurity    Worry: Never true    Inability: Never true  . Transportation needs    Medical: No    Non-medical: No  Tobacco Use  . Smoking status: Former Smoker    Packs/day: 2.00    Years: 6.00    Pack years: 12.00    Types: Cigarettes    Quit date: 02/04/1971    Years since quitting: 47.8  . Smokeless tobacco: Never Used  . Tobacco comment: smoked age [age], up to 2 ppd  Substance and Sexual Activity  . Alcohol use: Yes    Alcohol/week: 28.0 standard drinks    Types: 28 Shots of  liquor per week    Comment: 2-3 vodka martinins a day  . Drug use: No  . Sexual activity: Yes    Partners: Female  Lifestyle  . Physical activity    Days per week: 1 day    Minutes per session: 50 min  . Stress: Not at all  Relationships  . Social connections    Talks on phone: More than three times a week    Gets together: More than three times a week    Attends religious service: Not on file    Active member of club or organization: Not on file    Attends meetings of clubs or organizations: Not on file    Relationship status: Married  Other Topics Concern  . Not on file  Social History Narrative   Fun/Hobby: Play golf, grandchildren, [employer]      Patient is left-handed. He lives with his wife in a one level home. He swims most days.    Family History  Problem Relation Age of Onset  . Lung cancer Mother        smoker  . Leukemia Father        Acute myelocytic  . Diabetes Neg Hx   . Stroke Neg Hx   . Heart disease Neg Hx   . Colon cancer Neg Hx   . Colon polyps Neg Hx   . Esophageal cancer Neg Hx   . Rectal cancer Neg Hx   . Stomach cancer Neg Hx     Review of Systems  Constitutional: Negative for chills and fever.  Respiratory: Positive for shortness of breath (chronic). Negative for cough and wheezing.   Cardiovascular: Negative for chest pain, palpitations and leg swelling.  Gastrointestinal: Positive for diarrhea (all day yesterday). Negative for abdominal pain, blood in  stool, nausea and vomiting.       Gerd  Neurological: Positive for light-headedness (since the fall). Negative for dizziness and headaches.       Objective:   Vitals:   11/22/18 0934  BP: (!) 144/70  Pulse: (!) 116  Resp: 16  Temp: 99.2 F (37.3 C)  SpO2: 96%   BP Readings from Last 3 Encounters:  11/22/18 (!) 144/70  08/18/18 110/60  07/21/18 110/68   Wt Readings from Last 3 Encounters:  11/22/18 248 lb (112.5 kg)  08/24/18 240 lb (108.9 kg)  07/21/18 243 lb (110.2 kg)   Body mass index is 31.84 kg/m.   Physical Exam    Constitutional: Appears well-developed and well-nourished. No distress.  HENT:  Head: Normocephalic and atraumatic.  Neck: Neck supple. No tracheal deviation present. No thyromegaly present.  No cervical lymphadenopathy Cardiovascular: Normal rate, regular rhythm and normal heart sounds.   No murmur heard. No carotid bruit .  No edema Pulmonary/Chest: Effort normal and breath sounds normal. No respiratory distress. No has no wheezes. No rales.  Abd: soft, NT, ND Neuro: decreased sensation b/l LE, mild generalized weakness - no focal weakness Skin: Skin is warm and dry. Not diaphoretic.  Psychiatric: Normal mood and affect. Behavior is normal.       Assessment & Plan:    Advised to start taking thiamine 100 mg daily   See Problem List for Assessment and Plan of chronic medical problems.

## 2018-11-22 NOTE — Assessment & Plan Note (Signed)
Stopped metformin over one month ago Advised to check sugars at home  Will check a1c Advised f/u with Dr Ronnald Ramp in a couple of months to recheck a1c to see if he needs the metformin

## 2018-11-22 NOTE — Assessment & Plan Note (Signed)
Having lightheadedness since the fall - not prior Likely dehydrated at this time due to diarrhea yesterday and not drinking enough Advised to check sugars Increase fluids Cbc, cmp, a1c

## 2018-12-05 DIAGNOSIS — U071 COVID-19: Secondary | ICD-10-CM

## 2018-12-05 HISTORY — DX: COVID-19: U07.1

## 2018-12-23 DIAGNOSIS — M2391 Unspecified internal derangement of right knee: Secondary | ICD-10-CM | POA: Diagnosis not present

## 2018-12-23 DIAGNOSIS — M25561 Pain in right knee: Secondary | ICD-10-CM | POA: Diagnosis not present

## 2018-12-23 DIAGNOSIS — M238X1 Other internal derangements of right knee: Secondary | ICD-10-CM | POA: Diagnosis not present

## 2018-12-28 ENCOUNTER — Telehealth: Payer: Self-pay | Admitting: Internal Medicine

## 2018-12-28 NOTE — Telephone Encounter (Signed)
Pt states that in the past Dr. Ronnald Ramp has given him a muscle relaxer for his head and neck, but he can't remember the name of the medication.  Pt wants to know if he can have that again. Pt uses  [id number] - [place], [place] [phone] (Phone) [phone] (Fax)

## 2019-01-03 ENCOUNTER — Other Ambulatory Visit: Payer: Self-pay

## 2019-01-03 ENCOUNTER — Ambulatory Visit: Payer: Self-pay

## 2019-01-03 ENCOUNTER — Encounter (HOSPITAL_COMMUNITY): Payer: Self-pay

## 2019-01-03 ENCOUNTER — Ambulatory Visit (HOSPITAL_COMMUNITY)
Admission: EM | Admit: 2019-01-03 | Discharge: 2019-01-03 | Disposition: A | Discharge status: Home / Self Care | Payer: Medicare Other | Attending: Family Medicine | Admitting: Family Medicine

## 2019-01-03 DIAGNOSIS — R0602 Shortness of breath: Secondary | ICD-10-CM

## 2019-01-03 DIAGNOSIS — U071 COVID-19: Secondary | ICD-10-CM

## 2019-01-03 LAB — POC SARS CORONAVIRUS 2 AG -  ED: SARS Coronavirus 2 Ag: POSITIVE — AB

## 2019-01-03 LAB — POC SARS CORONAVIRUS 2 AG: SARS Coronavirus 2 Ag: POSITIVE — AB

## 2019-01-03 MED ORDER — PREDNISONE 20 MG PO TABS
ORAL_TABLET | ORAL | Status: AC
  Filled 2019-01-03: qty 1

## 2019-01-03 MED ORDER — PREDNISONE 20 MG PO TABS
20.0000 mg | ORAL_TABLET | Freq: Once | ORAL | Status: AC
  Administered 2019-01-03: 20 mg via ORAL

## 2019-01-03 MED ORDER — PREDNISONE 20 MG PO TABS: 20.0000 mg | ORAL_TABLET | Freq: Two times a day (BID) | ORAL | 0 refills | Status: DC

## 2019-01-03 NOTE — Telephone Encounter (Signed)
Pt contacted and he is needing a muscle relaxer (cyclobenzaprine) - his neck muscles are spasm ing and is requesting an rx. Please advise.   Pharm: [facility] on [address]

## 2019-01-03 NOTE — ED Triage Notes (Signed)
Patient presents to Urgent Care with complaints of shortness of breath since yesterday. Patient reports he was covid negative last week, does not feel like he has congestion in his lungs.

## 2019-01-03 NOTE — ED Provider Notes (Signed)
[place]    CSN: [id number] Arrival date & time: 01/03/19  1601      History   Chief Complaint Chief Complaint  Patient presents with  . Shortness of Breath    HPI Patrick Kidd is a 73 y.o. male.   HPI  Patient is here with shortness of breath since yesterday.  He states that he has a feeling of congestion in his lungs.  He is short of breath.  He states that he has severe dyspnea with any effort to walk.  He would like a wheelchair in order to go back out to his car.  He does not think he can make it without assistance.  He states that he has a feeling of tightness in his chest.  Feels an inability to take a deep breath.  No pain in his chest.  He does feel weak.  He feels tired.  He states he will be unable to lay down flat to sleep.  He feels slightly lightheaded. Denies underlying asthma or lung disease.  He is obese.  He has cirrhosis of the liver from chronic alcoholism.  He does have diabetes. He is at increased risk for coronavirus. He states he has been careful about social distancing and wearing a mask.  He did not go to any Thanksgiving gatherings. His rapid coronavirus test is positive.  This is discussed with him. His oxygenation is good.  He is quite short of breath.  We discussed possibly going down to the emergency room for additional testing.  Patient declines.  He would like to try to be treated at home. He is given prednisone here in the office.  Prednisone to take for 5 days at home.  Past Medical History:  Diagnosis Date  . Acrophobia   . Alcoholic cirrhosis ([place]) 07/29/6387  . Anemia   . Arthritis    foot by big toe  . BPH associated with nocturia   . Cataract    removed both eyes  . Chronic cough    PMH of  . Diabetes mellitus without complication ([place])   . Diverticulosis 07-03-2010   Colonoscopy.   . Duodenal ulcer 2017  . Fallen arches    Bilateral  . GERD (gastroesophageal reflux disease)   . Gout   . Granuloma annulare   .  Hx of adenomatous colonic polyps multiple  . Hydrocele 2011   Large septated right hydrocele  . Liver cyst   . Liver lesion   . Nonspecific elevation of levels of transaminase or lactic acid dehydrogenase (LDH)   . Obesity   . Peripheral neuropathy   . Plantar fasciitis    PMH of  . Portal hypertension ([place]) 2017  . Prostate cancer ([place])   . Right shoulder pain 11/2017  . Sleep apnea    no cpap, patient denies  . Wears reading eyeglasses     Patient Active Problem List   Diagnosis Date Noted  . Weakness 11/22/2018  . Lightheadedness 11/22/2018  . Fall 11/22/2018  . Degenerative disc disease, cervical 07/21/2018  . Pes planus 07/21/2018  . Cervical radiculopathy 07/16/2018  . Mastoiditis, acute, right 07/06/2018  . OAB (overactive bladder) 07/06/2018  . Malignant neoplasm of prostate ([place]) 01/13/2018  . 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 Hyperlipidemia LDL goal <100 11/11/2017  . Essential hypertension 11/11/2017  . BPH associated with nocturia 11/11/2017  . Erectile dysfunction due to arterial insufficiency 04/07/2017  . Medicare annual wellness visit, subsequent 10/02/2016  .  Peripheral vascular disease ([place]) 06/25/2016  . Alcoholic cirrhosis ([place]) 93/81/8299  . Liver disease 10/04/2015  . Type 2 diabetes mellitus with complication, with long-term current use of insulin ([place]) 08/22/2015  . Thrombocytopenia ([place]) 03/19/2014  . Hypersomnolence 03/19/2014  . Severe obesity with body mass index (BMI) of 35.0 to 39.9 with comorbidity ([place]) 07/19/2013  . Peripheral neuropathy 01/11/2013  . Polyclonal gammopathy 01/11/2013  . GERD 10/04/2009    Past Surgical History:  Procedure Laterality Date  . CATARACT EXTRACTION, BILATERAL  12/2011    Dr Gershon Crane  . COLONOSCOPY  2017  . COLONOSCOPY W/ POLYPECTOMY  07/03/2010   2 adenomas, diverticulosis on right. Dr [name]  . CYSTOSCOPY N/A 03/15/2018   Procedure: CYSTOSCOPY FLEXIBLE;   Surgeon: [name], MD;  Location: [facility];  Service: Urology;  Laterality: N/A;  NO SEEDS FOUND IN BLADDER  . ESOPHAGOGASTRODUODENOSCOPY (EGD) WITH PROPOFOL N/A 01/08/2016   Procedure: ESOPHAGOGASTRODUODENOSCOPY (EGD) WITH PROPOFOL;  Surgeon: [name], MD;  Location: [facility];  Service: Endoscopy;  Laterality: N/A;  . FLEXIBLE SIGMOIDOSCOPY  2000  . RADIOACTIVE SEED IMPLANT N/A 03/15/2018   Procedure: RADIOACTIVE SEED IMPLANT/BRACHYTHERAPY IMPLANT;  Surgeon: [name], MD;  Location: [facility];  Service: Urology;  Laterality: N/A;   73 SEEDS IMPLANTED  . SIGMOIDOSCOPY    . SPACE OAR INSTILLATION N/A 03/15/2018   Procedure: SPACE OAR INSTILLATION;  Surgeon: [name], MD;  Location: [facility];  Service: Urology;  Laterality: N/A;  . WISDOM TOOTH EXTRACTION         Home Medications    Prior to Admission medications   Medication Sig Start Date End Date Taking? Authorizing Provider  blood glucose meter kit and supplies KIT Use to test blood sugar once daily. DX E11.8 12/21/17   [name], MD  Blood Glucose Monitoring Suppl (FREESTYLE FREEDOM LITE) w/Device KIT Use to check blood sugars 2 times daily 01/14/16   [name], FNP  gabapentin (NEURONTIN) 300 MG capsule Take 1 capsule (300 mg total) by mouth 2 (two) times daily. 08/24/18   Tomi Likens, Adam R, DO  glucose blood (COOL BLOOD GLUCOSE TEST STRIPS) test strip Use to check blood sugar daily. DX: E11.8 12/21/17   [name], MD  Lancets (ACCU-CHEK MULTICLIX) lancets Use to check sugar daily. DX: E11.8 12/21/17   [name], MD  metFORMIN (GLUCOPHAGE-XR) 500 MG 24 hr tablet TAKE 1 TABLET(500 MG) BY MOUTH DAILY WITH BREAKFAST 04/20/18   [name], MD  pantoprazole (PROTONIX) 40 MG tablet TAKE 1 TABLET(40 MG) BY MOUTH TWICE DAILY 03/17/18   Gatha Mayer, MD  predniSONE (DELTASONE) 20 MG tablet Take 1 tablet (20 mg total) by mouth 2  (two) times daily with a meal. 01/03/19   Raylene Everts, MD    Family History Family History  Problem Relation Age of Onset  . Lung cancer Mother        smoker  . Leukemia Father        Acute myelocytic  . Diabetes Neg Hx   . Stroke Neg Hx   . Heart disease Neg Hx   . Colon cancer Neg Hx   . Colon polyps Neg Hx   . Esophageal cancer Neg Hx   . Rectal cancer Neg Hx   . Stomach cancer Neg Hx     Social History Social History   Tobacco Use  . Smoking status: Former Smoker  Packs/day: 2.00    Years: 6.00    Pack years: 12.00    Types: Cigarettes    Quit date: 02/04/1971    Years since quitting: 47.9  . Smokeless tobacco: Never Used  . Tobacco comment: smoked age [age], up to 2 ppd  Substance Use Topics  . Alcohol use: Yes    Alcohol/week: 28.0 standard drinks    Types: 28 Shots of liquor per week    Comment: 2-3 vodka martinins a day  . Drug use: No     Allergies   Patient has no known allergies.   Review of Systems Review of Systems  Constitutional: Positive for appetite change and fatigue. Negative for chills and fever.  HENT: Negative for ear pain and sore throat.   Eyes: Negative for pain and visual disturbance.  Respiratory: Positive for cough, chest tightness and shortness of breath.   Cardiovascular: Negative for chest pain and palpitations.  Gastrointestinal: Negative for abdominal pain and vomiting.  Genitourinary: Negative for dysuria and hematuria.  Musculoskeletal: Positive for myalgias. Negative for arthralgias and back pain.  Skin: Negative for color change and rash.  Neurological: Positive for dizziness. Negative for seizures, syncope and headaches.  All other systems reviewed and are negative.    Physical Exam Triage Vital Signs ED Triage Vitals  Enc Vitals Group     BP 01/03/19 1658 (!) 173/81     Pulse Rate 01/03/19 1658 (!) 109     Resp 01/03/19 1658 20     Temp 01/03/19 1658 98.2 F (36.8 C)     Temp Source 01/03/19 1658 Oral      SpO2 01/03/19 1658 95 %     Weight --      Height --      Head Circumference --      Peak Flow --      Pain Score 01/03/19 1821 0     Pain Loc --      Pain Edu? --      Excl. in [place]? --    No data found.  Updated Vital Signs BP (!) 173/81 (BP Location: Right Arm)   Pulse (!) 109   Temp 98.2 F (36.8 C) (Oral)   Resp 20   SpO2 95%      Physical Exam Constitutional:      General: He is in acute distress.     Appearance: He is well-developed. He is obese. He is ill-appearing. He is not toxic-appearing or diaphoretic.     Comments: Dyspneic with conversation  HENT:     Head: Normocephalic and atraumatic.     Mouth/Throat:     Mouth: Mucous membranes are moist.  Eyes:     Conjunctiva/sclera: Conjunctivae normal.     Pupils: Pupils are equal, round, and reactive to light.  Neck:     Musculoskeletal: Normal range of motion.  Cardiovascular:     Rate and Rhythm: Regular rhythm. Tachycardia present.  Pulmonary:     Effort: Respiratory distress present.     Breath sounds: Decreased breath sounds present. No wheezing, rhonchi or rales.  Chest:     Chest wall: No mass.  Abdominal:     General: There is no distension.     Palpations: Abdomen is soft.     Comments: Protuberant abdomen  Musculoskeletal: Normal range of motion.     Right lower leg: No edema.     Left lower leg: No edema.  Lymphadenopathy:     Cervical: No cervical adenopathy.  Skin:  General: Skin is warm and dry.     Coloration: Skin is not cyanotic.  Neurological:     General: No focal deficit present.     Mental Status: He is alert.  Psychiatric:        Mood and Affect: Mood normal.        Behavior: Behavior normal.      UC Treatments / Results  Labs (all labs ordered are listed, but only abnormal results are displayed) Labs Reviewed  POC SARS CORONAVIRUS 2 AG -  ED - Abnormal; Notable for the following components:      Result Value   SARS Coronavirus 2 Ag POSITIVE (*)    All other  components within normal limits  POC SARS CORONAVIRUS 2 AG - Abnormal; Notable for the following components:   SARS Coronavirus 2 Ag POSITIVE (*)    All other components within normal limits    EKG   Radiology No results found.  Procedures Procedures (including critical care time)  Medications Ordered in UC Medications  predniSONE (DELTASONE) tablet 20 mg (20 mg Oral Given 01/03/19 1801)  predniSONE (DELTASONE) 20 MG tablet (has no administration in time range)    Initial Impression / Assessment and Plan / UC Course  I have reviewed the triage vital signs and the nursing notes.  Pertinent labs & imaging results that were available during my care of the patient were reviewed by me and considered in my medical decision making (see chart for details).     Coronavirus pos Discussed treatment Reasons for return Final Clinical Impressions(s) / UC Diagnoses   Final diagnoses:  SOB (shortness of breath)  COVID-19     Discharge Instructions     Rest Drink plenty fluids Take over-the-counter cough and cold medicines Tylenol for pain and fever Take prednisone 2 times a day Go to the emergency room if you become more short of breath, or weak and dizzy   ED Prescriptions    Medication Sig Dispense Auth. Provider   predniSONE (DELTASONE) 20 MG tablet Take 1 tablet (20 mg total) by mouth 2 (two) times daily with a meal. 10 tablet Raylene Everts, MD     PDMP not reviewed this encounter.   Raylene Everts, MD 01/03/19 [phone]

## 2019-01-03 NOTE — Telephone Encounter (Signed)
Incoming call from Patient with complaint of being winded when walking in a  room and  Feels like he walked around the block. States its constant. Rated mild to moderate.  Has a history of acid reflux and a bleeding ulcer. Denies any other Sx. Patient states that he will go to Urgent Care.               Reason for Disposition . [1] MODERATE difficulty breathing (e.g., speaks in phrases, SOB even at rest, pulse 100-120) AND [2] NEW-onset or WORSE than normal  Answer Assessment - Initial Assessment Questions 1. RESPIRATORY STATUS: "Describe your breathing?" (e.g., wheezing, shortness of breath, unable to speak, severe coughing)     SOB  Fell like I walked around the block and walked to next 2. ONSET: "When did this breathing problem begin?"      Yesterday constant 3. PATTERN "Does the difficult breathing come and go, or has it been constant since it started?"     constant 4. SEVERITY: "How bad is your breathing?" (e.g., mild, moderate, severe)    - MILD: No SOB at rest, mild SOB with walking, speaks normally in sentences, can lay down, no retractions, pulse < 100.    - MODERATE: SOB at rest, SOB with minimal exertion and prefers to sit, cannot lie down flat, speaks in phrases, mild retractions, audible wheezing, pulse 100-120.    - SEVERE: Very SOB at rest, speaks in single words, struggling to breathe, sitting hunched forward, retractions, pulse > 120      Mild to moderate 5. RECURRENT SYMPTOM: "Have you had difficulty breathing before?" If so, ask: "When was the last time?" and "What happened that time?"     denies 6. CARDIAC HISTORY: "Do you have any history of heart disease?" (e.g., heart attack, angina, bypass surgery, angioplasty)     denies 7. LUNG HISTORY: "Do you have any history of lung disease?"  (e.g., pulmonary embolus, asthma, emphysema)      Acid reflux,  Bleeding ulcer 8. CAUSE: "What do you think is causing the breathing problem?"      *No Answer* 9. OTHER SYMPTOMS:  "Do you have any other symptoms? (e.g., dizziness, runny nose, cough, chest pain, fever)   Denies 10. PREGNANCY: "Is there any chance you are pregnant?" "When was your last menstrual period?"       *No Answer* 11. TRAVEL: "Have you traveled out of the country in the last month?" (e.g., travel history, exposures)       *No Answer*  Protocols used: BREATHING DIFFICULTY-A-AH

## 2019-01-03 NOTE — Discharge Instructions (Signed)
Rest Drink plenty fluids Take over-the-counter cough and cold medicines Tylenol for pain and fever Take prednisone 2 times a day Go to the emergency room if you become more short of breath, or weak and dizzy

## 2019-01-04 ENCOUNTER — Other Ambulatory Visit: Payer: Self-pay | Admitting: Internal Medicine

## 2019-01-04 DIAGNOSIS — M5030 Other cervical disc degeneration, unspecified cervical region: Secondary | ICD-10-CM

## 2019-01-04 MED ORDER — CYCLOBENZAPRINE HCL 5 MG PO TABS: ORAL_TABLET | ORAL | 1 refills | Status: DC

## 2019-01-04 NOTE — Telephone Encounter (Signed)
RX sent  TJ 

## 2019-01-05 NOTE — Telephone Encounter (Signed)
Pt informed of same.  

## 2019-01-07 ENCOUNTER — Telehealth: Payer: Self-pay

## 2019-01-07 NOTE — Telephone Encounter (Signed)
Ps wife called to find out if Pt should have refill for Prednisone that he was prescribed in the ED / advised Pt per [facility] that once round of Prednisone was completed that was it and if Pt starts to worsen or show increased symptoms to go back to ED.

## 2019-01-07 NOTE — Telephone Encounter (Signed)
Pt's wife called back and would like to know if pt will need a refill for his prednisone that was prescribed while he was at the hospital. He is down to one dose. Please advise.

## 2019-01-07 NOTE — Telephone Encounter (Signed)
Wife calling to review quarantine guidelines for pt. He tested positive for COVID 19. Also asking if Dr. Ronnald Ramp is ordering antibody tests. Please advise.

## 2019-01-07 NOTE — Telephone Encounter (Signed)
Patient is returning nurse call Call back [phone]

## 2019-01-10 ENCOUNTER — Other Ambulatory Visit: Payer: Self-pay | Admitting: Internal Medicine

## 2019-01-10 ENCOUNTER — Telehealth: Payer: Self-pay

## 2019-01-10 DIAGNOSIS — N3281 Overactive bladder: Secondary | ICD-10-CM

## 2019-01-10 DIAGNOSIS — M5030 Other cervical disc degeneration, unspecified cervical region: Secondary | ICD-10-CM

## 2019-01-10 MED ORDER — MIRABEGRON ER 25 MG PO TB24: 25.0000 mg | ORAL_TABLET | Freq: Every day | ORAL | 0 refills | Status: DC

## 2019-01-10 MED ORDER — CYCLOBENZAPRINE HCL 5 MG PO TABS: ORAL_TABLET | ORAL | 1 refills | Status: DC

## 2019-01-10 NOTE — Telephone Encounter (Signed)
Try [employer] sent  TJ

## 2019-01-10 NOTE — Telephone Encounter (Addendum)
Pt stated that another rx for is urinary incontinence was going to be sent in a few months ago. He has tried the [employer] and [employer] [facility].   Please advise if there was another alternative you wanted to send in?

## 2019-01-10 NOTE — Telephone Encounter (Signed)
Pt informed rx for prednisone is not needed unless he is feeling bad. Pt stated that he is feeling great.

## 2019-01-10 NOTE — Telephone Encounter (Signed)
Pt informed of same.  

## 2019-01-12 ENCOUNTER — Ambulatory Visit: Payer: Self-pay | Admitting: Internal Medicine

## 2019-01-12 NOTE — Telephone Encounter (Signed)
Per initial encounter, "Pt's wife called because she wants advice on how to sterilize the room where her husband that tested positive quarantined; contacted pt's wife; she would like to know how to clean their bedroom because her husband is about to come off COVID quarantine; explained to follow instructions on the disinfectants she will be using; also recommended for pt's wife to view the CDC website for cleaning guidelines, or seek advice from a professional cleaning service; she verbalized understanding.   Reason for Disposition . General information question, no triage required and triager able to answer question  Answer Assessment - Initial Assessment Questions 1. REASON FOR CALL or QUESTION: "What is your reason for calling today?" or "How can I best help you?" or "What question do you have that I can help answer?"     cleaning after COVID quarantine  Protocols used: [place]

## 2019-01-25 ENCOUNTER — Ambulatory Visit (INDEPENDENT_AMBULATORY_CARE_PROVIDER_SITE_OTHER): Payer: Medicare Other | Admitting: Family

## 2019-01-25 ENCOUNTER — Encounter: Payer: Self-pay | Admitting: Family

## 2019-01-25 DIAGNOSIS — R296 Repeated falls: Secondary | ICD-10-CM | POA: Diagnosis not present

## 2019-01-25 DIAGNOSIS — R531 Weakness: Secondary | ICD-10-CM | POA: Diagnosis not present

## 2019-01-25 DIAGNOSIS — R0781 Pleurodynia: Secondary | ICD-10-CM

## 2019-01-25 DIAGNOSIS — M25561 Pain in right knee: Secondary | ICD-10-CM | POA: Diagnosis not present

## 2019-01-25 DIAGNOSIS — G8929 Other chronic pain: Secondary | ICD-10-CM

## 2019-01-25 MED ORDER — TRAMADOL HCL 50 MG PO TABS: 50.0000 mg | ORAL_TABLET | Freq: Three times a day (TID) | ORAL | 0 refills | Status: DC | PRN

## 2019-01-25 NOTE — Progress Notes (Signed)
Patrick Kidd is a 73 y.o. male with the following history as recorded in EpicCare:  Patient Active Problem List   Diagnosis Date Noted  . Weakness 11/22/2018  . Degenerative disc disease, cervical 07/21/2018  . Cervical radiculopathy 07/16/2018  . Mastoiditis, acute, right 07/06/2018  . OAB (overactive bladder) 07/06/2018  . Malignant neoplasm of prostate ([place]) 01/13/2018  . 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 Hyperlipidemia LDL goal <100 11/11/2017  . Essential hypertension 11/11/2017  . BPH associated with nocturia 11/11/2017  . Erectile dysfunction due to arterial insufficiency 04/07/2017  . Medicare annual wellness visit, subsequent 10/02/2016  . Peripheral vascular disease ([place]) 06/25/2016  . Alcoholic cirrhosis ([place]) 30/08/6224  . Liver disease 10/04/2015  . Type 2 diabetes mellitus with complication, with long-term current use of insulin ([place]) 08/22/2015  . Thrombocytopenia ([place]) 03/19/2014  . Hypersomnolence 03/19/2014  . Severe obesity with body mass index (BMI) of 35.0 to 39.9 with comorbidity ([place]) 07/19/2013  . Peripheral neuropathy 01/11/2013  . Polyclonal gammopathy 01/11/2013  . GERD 10/04/2009    Current Outpatient Medications  Medication Sig Dispense Refill  . blood glucose meter kit and supplies KIT Use to test blood sugar once daily. DX E11.8 1 each 0  . Blood Glucose Monitoring Suppl (FREESTYLE FREEDOM LITE) w/Device KIT Use to check blood sugars 2 times daily 1 each 1  . cyclobenzaprine (FLEXERIL) 5 MG tablet Take 1 to 2 tabs at bedtime if needed. 30 tablet 1  . gabapentin (NEURONTIN) 300 MG capsule Take 1 capsule (300 mg total) by mouth 2 (two) times daily. 180 capsule 3  . glucose blood (COOL BLOOD GLUCOSE TEST STRIPS) test strip Use to check blood sugar daily. DX: E11.8 100 each 12  . Lancets (ACCU-CHEK MULTICLIX) lancets Use to check sugar daily. DX: E11.8 100 each 12  . metFORMIN (GLUCOPHAGE-XR) 500 MG  24 hr tablet TAKE 1 TABLET(500 MG) BY MOUTH DAILY WITH BREAKFAST 90 tablet 1  . mirabegron ER (MYRBETRIQ) 25 MG TB24 tablet Take 1 tablet (25 mg total) by mouth daily. 90 tablet 0  . pantoprazole (PROTONIX) 40 MG tablet TAKE 1 TABLET(40 MG) BY MOUTH TWICE DAILY 180 tablet 0   No current facility-administered medications for this visit.    Allergies: Patient has no known allergies.  Past Medical History:  Diagnosis Date  . Acrophobia   . Alcoholic cirrhosis ([place]) 3/33/5456  . Anemia   . Arthritis    foot by big toe  . BPH associated with nocturia   . Cataract    removed both eyes  . Chronic cough    PMH of  . Diabetes mellitus without complication ([place])   . Diverticulosis 07-03-2010   Colonoscopy.   . Duodenal ulcer 2017  . Fallen arches    Bilateral  . GERD (gastroesophageal reflux disease)   . Gout   . Granuloma annulare   . Hx of adenomatous colonic polyps multiple  . Hydrocele 2011   Large septated right hydrocele  . Liver cyst   . Liver lesion   . Nonspecific elevation of levels of transaminase or lactic acid dehydrogenase (LDH)   . Obesity   . Peripheral neuropathy   . Plantar fasciitis    PMH of  . Portal hypertension ([place]) 2017  . Prostate cancer ([place])   . Right shoulder pain 11/2017  . Sleep apnea    no cpap, patient denies  . Wears reading eyeglasses     Past Surgical History:  Procedure Laterality  Date  . CATARACT EXTRACTION, BILATERAL  12/2011    Dr Gershon Crane  . COLONOSCOPY  2017  . COLONOSCOPY W/ POLYPECTOMY  07/03/2010   2 adenomas, diverticulosis on right. Dr [name]  . CYSTOSCOPY N/A 03/15/2018   Procedure: CYSTOSCOPY FLEXIBLE;  Surgeon: [name], MD;  [facility];  Service: Urology;  Laterality: N/A;  NO SEEDS FOUND IN BLADDER  . ESOPHAGOGASTRODUODENOSCOPY (EGD) WITH PROPOFOL N/A 01/08/2016   Procedure: ESOPHAGOGASTRODUODENOSCOPY (EGD) WITH PROPOFOL;  Surgeon: [name], MD;  [facility];  Service:  Endoscopy;  Laterality: N/A;  . FLEXIBLE SIGMOIDOSCOPY  2000  . RADIOACTIVE SEED IMPLANT N/A 03/15/2018   Procedure: RADIOACTIVE SEED IMPLANT/BRACHYTHERAPY IMPLANT;  Surgeon: [name], MD;  Location: [facility];  Service: Urology;  Laterality: N/A;   72 SEEDS IMPLANTED  . SIGMOIDOSCOPY    . SPACE OAR INSTILLATION N/A 03/15/2018   Procedure: SPACE OAR INSTILLATION;  Surgeon: [name], MD;  Location: [facility];  Service: Urology;  Laterality: N/A;  . WISDOM TOOTH EXTRACTION      Family History  Problem Relation Age of Onset  . Lung cancer Mother        smoker  . Leukemia Father        Acute myelocytic  . Diabetes Neg Hx   . Stroke Neg Hx   . Heart disease Neg Hx   . Colon cancer Neg Hx   . Colon polyps Neg Hx   . Esophageal cancer Neg Hx   . Rectal cancer Neg Hx   . Stomach cancer Neg Hx     Social History   Tobacco Use  . Smoking status: Former Smoker    Packs/day: 2.00    Years: 6.00    Pack years: 12.00    Types: Cigarettes    Quit date: 02/04/1971    Years since quitting: 48.0  . Smokeless tobacco: Never Used  . Tobacco comment: smoked age [age], up to 2 ppd  Substance Use Topics  . Alcohol use: Yes    Alcohol/week: 28.0 standard drinks    Types: 28 Shots of liquor per week    Comment: 2-3 vodka martinins a day    Subjective:     I connected with Darron Doom on 01/25/19 at 12:00 PM EST by telephone call and verified that I am speaking with the correct person using two identifiers. Provider in office/ patient is at home; provider and patient are only 2 people on call.     I discussed the limitations of evaluation and management by telemedicine and the availability of in person appointments. The patient expressed understanding and agreed to proceed.  Patient was diagnosed with COVID on November 30; he notes he is recovering but feels like he has no stamina. He is somewhat of a difficult historian but it sounds  like he feels that his walking/ balance/ chronic knee issues have been worsened due to complications from [place]. He did see his orthopedist last week about his knee pain and MRI is pending; patient is requesting order for PT- he has not discussed this with his orthopedist.  He also mentions that he has fallen at least 2 x in the past 2-3 weeks. He is concerned about left rib pain/ bruise over his left rib due to the pain. He has not coughed up any blood but he does ask if he could have something for pain. He does not want to come get a  rib x-ray at this time.   He also complains about continuing problems with his overactive bladder but notes that he has not been able to afford medication recently prescribed.    Objective:  There were no vitals filed for this visit.  Lungs: Respirations unlabored;  Neurologic: Alert and oriented; speech intact;    1. Chronic pain of right knee   2. Frequent falls   3. Weakness   4. Rib pain     Plan:  1. Patient will contact his orthopedist about starting PT; 2. Explained to patient that he may need in office visit for further discussion/ evaluation; he feels that starting PT for his knee will be beneficial; he will see his PCP with continued concerns. Already follows with neurology; 3. Secondary to recovery from [place]; reassurance that patient's strength and stamina will continue to improve; consider in office visit with his PCP for continued symptoms. 4. Patient defers X-ray at this time; he is now 21 days out from [place] diagnosis so is eligible for X-ray; agree to small amount of Tramadol for pain but will need to be seen in office if symptoms persist.  Time spent 20 minutes  No follow-ups on file.  No orders of the defined types were placed in this encounter.   Requested Prescriptions    No prescriptions requested or ordered in this encounter

## 2019-01-28 ENCOUNTER — Encounter (HOSPITAL_COMMUNITY): Payer: Self-pay

## 2019-01-28 ENCOUNTER — Other Ambulatory Visit: Payer: Self-pay

## 2019-01-28 ENCOUNTER — Inpatient Hospital Stay (HOSPITAL_COMMUNITY)
Admission: EM | Admit: 2019-01-28 | Discharge: 2019-02-09 | DRG: 441 | Disposition: A | Discharge status: Skilled Nursing Facility | Payer: Medicare Other | Attending: Internal Medicine | Admitting: Internal Medicine

## 2019-01-28 ENCOUNTER — Emergency Department (HOSPITAL_COMMUNITY): Payer: Medicare Other

## 2019-01-28 DIAGNOSIS — K429 Umbilical hernia without obstruction or gangrene: Secondary | ICD-10-CM | POA: Diagnosis present

## 2019-01-28 DIAGNOSIS — I456 Pre-excitation syndrome: Secondary | ICD-10-CM | POA: Diagnosis present

## 2019-01-28 DIAGNOSIS — Z79891 Long term (current) use of opiate analgesic: Secondary | ICD-10-CM

## 2019-01-28 DIAGNOSIS — F10231 Alcohol dependence with withdrawal delirium: Secondary | ICD-10-CM | POA: Diagnosis not present

## 2019-01-28 DIAGNOSIS — Z801 Family history of malignant neoplasm of trachea, bronchus and lung: Secondary | ICD-10-CM

## 2019-01-28 DIAGNOSIS — I10 Essential (primary) hypertension: Secondary | ICD-10-CM | POA: Diagnosis present

## 2019-01-28 DIAGNOSIS — Z888 Allergy status to other drugs, medicaments and biological substances status: Secondary | ICD-10-CM

## 2019-01-28 DIAGNOSIS — Z79899 Other long term (current) drug therapy: Secondary | ICD-10-CM

## 2019-01-28 DIAGNOSIS — G4730 Sleep apnea, unspecified: Secondary | ICD-10-CM | POA: Diagnosis present

## 2019-01-28 DIAGNOSIS — Z8546 Personal history of malignant neoplasm of prostate: Secondary | ICD-10-CM

## 2019-01-28 DIAGNOSIS — I4891 Unspecified atrial fibrillation: Secondary | ICD-10-CM | POA: Diagnosis not present

## 2019-01-28 DIAGNOSIS — E118 Type 2 diabetes mellitus with unspecified complications: Secondary | ICD-10-CM

## 2019-01-28 DIAGNOSIS — E1142 Type 2 diabetes mellitus with diabetic polyneuropathy: Secondary | ICD-10-CM | POA: Diagnosis present

## 2019-01-28 DIAGNOSIS — D684 Acquired coagulation factor deficiency: Secondary | ICD-10-CM | POA: Diagnosis present

## 2019-01-28 DIAGNOSIS — Z794 Long term (current) use of insulin: Secondary | ICD-10-CM

## 2019-01-28 DIAGNOSIS — U071 COVID-19: Secondary | ICD-10-CM | POA: Diagnosis present

## 2019-01-28 DIAGNOSIS — M1990 Unspecified osteoarthritis, unspecified site: Secondary | ICD-10-CM | POA: Diagnosis present

## 2019-01-28 DIAGNOSIS — K7290 Hepatic failure, unspecified without coma: Principal | ICD-10-CM | POA: Diagnosis present

## 2019-01-28 DIAGNOSIS — I493 Ventricular premature depolarization: Secondary | ICD-10-CM | POA: Diagnosis not present

## 2019-01-28 DIAGNOSIS — R000 Tachycardia, unspecified: Secondary | ICD-10-CM | POA: Diagnosis not present

## 2019-01-28 DIAGNOSIS — Z6831 Body mass index (BMI) 31.0-31.9, adult: Secondary | ICD-10-CM

## 2019-01-28 DIAGNOSIS — Z23 Encounter for immunization: Secondary | ICD-10-CM

## 2019-01-28 DIAGNOSIS — Z20822 Contact with and (suspected) exposure to covid-19: Secondary | ICD-10-CM | POA: Diagnosis present

## 2019-01-28 DIAGNOSIS — E876 Hypokalemia: Secondary | ICD-10-CM | POA: Diagnosis not present

## 2019-01-28 DIAGNOSIS — E871 Hypo-osmolality and hyponatremia: Secondary | ICD-10-CM | POA: Diagnosis present

## 2019-01-28 DIAGNOSIS — Z8711 Personal history of peptic ulcer disease: Secondary | ICD-10-CM

## 2019-01-28 DIAGNOSIS — Z923 Personal history of irradiation: Secondary | ICD-10-CM

## 2019-01-28 DIAGNOSIS — D6959 Other secondary thrombocytopenia: Secondary | ICD-10-CM | POA: Diagnosis present

## 2019-01-28 DIAGNOSIS — K766 Portal hypertension: Secondary | ICD-10-CM | POA: Diagnosis present

## 2019-01-28 DIAGNOSIS — R7401 Elevation of levels of liver transaminase levels: Secondary | ICD-10-CM | POA: Diagnosis present

## 2019-01-28 DIAGNOSIS — K7682 Hepatic encephalopathy: Secondary | ICD-10-CM

## 2019-01-28 DIAGNOSIS — Z7984 Long term (current) use of oral hypoglycemic drugs: Secondary | ICD-10-CM

## 2019-01-28 DIAGNOSIS — R17 Unspecified jaundice: Secondary | ICD-10-CM

## 2019-01-28 DIAGNOSIS — D72829 Elevated white blood cell count, unspecified: Secondary | ICD-10-CM

## 2019-01-28 DIAGNOSIS — K828 Other specified diseases of gallbladder: Secondary | ICD-10-CM | POA: Diagnosis present

## 2019-01-28 DIAGNOSIS — J9811 Atelectasis: Secondary | ICD-10-CM | POA: Diagnosis present

## 2019-01-28 DIAGNOSIS — Z8616 Personal history of COVID-19: Secondary | ICD-10-CM

## 2019-01-28 DIAGNOSIS — K810 Acute cholecystitis: Secondary | ICD-10-CM | POA: Diagnosis present

## 2019-01-28 DIAGNOSIS — R351 Nocturia: Secondary | ICD-10-CM | POA: Diagnosis present

## 2019-01-28 DIAGNOSIS — R778 Other specified abnormalities of plasma proteins: Secondary | ICD-10-CM | POA: Diagnosis present

## 2019-01-28 DIAGNOSIS — Z806 Family history of leukemia: Secondary | ICD-10-CM

## 2019-01-28 DIAGNOSIS — E1165 Type 2 diabetes mellitus with hyperglycemia: Secondary | ICD-10-CM | POA: Diagnosis present

## 2019-01-28 DIAGNOSIS — Z20828 Contact with and (suspected) exposure to other viral communicable diseases: Secondary | ICD-10-CM | POA: Diagnosis not present

## 2019-01-28 DIAGNOSIS — D509 Iron deficiency anemia, unspecified: Secondary | ICD-10-CM | POA: Diagnosis present

## 2019-01-28 DIAGNOSIS — R109 Unspecified abdominal pain: Secondary | ICD-10-CM

## 2019-01-28 DIAGNOSIS — R188 Other ascites: Secondary | ICD-10-CM

## 2019-01-28 DIAGNOSIS — K219 Gastro-esophageal reflux disease without esophagitis: Secondary | ICD-10-CM | POA: Diagnosis present

## 2019-01-28 DIAGNOSIS — Z4659 Encounter for fitting and adjustment of other gastrointestinal appliance and device: Secondary | ICD-10-CM

## 2019-01-28 DIAGNOSIS — E860 Dehydration: Secondary | ICD-10-CM | POA: Diagnosis present

## 2019-01-28 DIAGNOSIS — Z9842 Cataract extraction status, left eye: Secondary | ICD-10-CM

## 2019-01-28 DIAGNOSIS — N401 Enlarged prostate with lower urinary tract symptoms: Secondary | ICD-10-CM | POA: Diagnosis present

## 2019-01-28 DIAGNOSIS — Z87891 Personal history of nicotine dependence: Secondary | ICD-10-CM

## 2019-01-28 DIAGNOSIS — Z9841 Cataract extraction status, right eye: Secondary | ICD-10-CM

## 2019-01-28 DIAGNOSIS — K652 Spontaneous bacterial peritonitis: Secondary | ICD-10-CM | POA: Diagnosis present

## 2019-01-28 DIAGNOSIS — K7031 Alcoholic cirrhosis of liver with ascites: Secondary | ICD-10-CM | POA: Diagnosis present

## 2019-01-28 DIAGNOSIS — Z1611 Resistance to penicillins: Secondary | ICD-10-CM | POA: Diagnosis present

## 2019-01-28 DIAGNOSIS — K8000 Calculus of gallbladder with acute cholecystitis without obstruction: Secondary | ICD-10-CM | POA: Diagnosis present

## 2019-01-28 DIAGNOSIS — Z86010 Personal history of colonic polyps: Secondary | ICD-10-CM

## 2019-01-28 DIAGNOSIS — Z791 Long term (current) use of non-steroidal anti-inflammatories (NSAID): Secondary | ICD-10-CM

## 2019-01-28 LAB — HEPATIC FUNCTION PANEL
ALT: 34 U/L (ref 0–44)
AST: 46 U/L — ABNORMAL HIGH (ref 15–41)
Albumin: 2.1 g/dL — ABNORMAL LOW (ref 3.5–5.0)
Alkaline Phosphatase: 316 U/L — ABNORMAL HIGH (ref 38–126)
Bilirubin, Direct: 11.2 mg/dL — ABNORMAL HIGH (ref 0.0–0.2)
Indirect Bilirubin: 4.5 mg/dL — ABNORMAL HIGH (ref 0.3–0.9)
Total Bilirubin: 15.7 mg/dL — ABNORMAL HIGH (ref 0.3–1.2)
Total Protein: 6 g/dL — ABNORMAL LOW (ref 6.5–8.1)

## 2019-01-28 LAB — CBC WITH DIFFERENTIAL/PLATELET
Abs Immature Granulocytes: 0.36 10*3/uL — ABNORMAL HIGH (ref 0.00–0.07)
Basophils Absolute: 0.1 10*3/uL (ref 0.0–0.1)
Basophils Relative: 1 %
Eosinophils Absolute: 0 10*3/uL (ref 0.0–0.5)
Eosinophils Relative: 0 %
HCT: 36.4 % — ABNORMAL LOW (ref 39.0–52.0)
Hemoglobin: 12.8 g/dL — ABNORMAL LOW (ref 13.0–17.0)
Immature Granulocytes: 2 %
Lymphocytes Relative: 10 %
Lymphs Abs: 1.5 10*3/uL (ref 0.7–4.0)
MCH: 33.8 pg (ref 26.0–34.0)
MCHC: 35.2 g/dL (ref 30.0–36.0)
MCV: 96 fL (ref 80.0–100.0)
Monocytes Absolute: 1.3 10*3/uL — ABNORMAL HIGH (ref 0.1–1.0)
Monocytes Relative: 8 %
Neutro Abs: 12.2 10*3/uL — ABNORMAL HIGH (ref 1.7–7.7)
Neutrophils Relative %: 79 %
Platelets: 97 10*3/uL — ABNORMAL LOW (ref 150–400)
RBC: 3.79 MIL/uL — ABNORMAL LOW (ref 4.22–5.81)
RDW: 12.9 % (ref 11.5–15.5)
WBC: 15.4 10*3/uL — ABNORMAL HIGH (ref 4.0–10.5)
nRBC: 0 % (ref 0.0–0.2)

## 2019-01-28 LAB — MAGNESIUM: Magnesium: 1.5 mg/dL — ABNORMAL LOW (ref 1.7–2.4)

## 2019-01-28 LAB — BASIC METABOLIC PANEL
Anion gap: 14 (ref 5–15)
BUN: 29 mg/dL — ABNORMAL HIGH (ref 8–23)
CO2: 24 mmol/L (ref 22–32)
Calcium: 8.3 mg/dL — ABNORMAL LOW (ref 8.9–10.3)
Chloride: 86 mmol/L — ABNORMAL LOW (ref 98–111)
Creatinine, Ser: 1.15 mg/dL (ref 0.61–1.24)
GFR calc Af Amer: >60 mL/min (ref >60)
GFR calc non Af Amer: >60 mL/min (ref >60)
Glucose, Bld: 514 mg/dL (ref 70–99)
Potassium: 4.7 mmol/L (ref 3.5–5.1)
Sodium: 124 mmol/L — ABNORMAL LOW (ref 135–145)

## 2019-01-28 LAB — PROTIME-INR
INR: 1.3 — ABNORMAL HIGH (ref 0.8–1.2)
Prothrombin Time: 16.2 seconds — ABNORMAL HIGH (ref 11.4–15.2)

## 2019-01-28 LAB — CBG MONITORING, ED: Glucose-Capillary: 431 mg/dL — ABNORMAL HIGH (ref 70–99)

## 2019-01-28 MED ORDER — INSULIN ASPART 100 UNIT/ML ~~LOC~~ SOLN
10.0000 [IU] | Freq: Once | SUBCUTANEOUS | Status: AC
  Administered 2019-01-28: 10 [IU] via SUBCUTANEOUS
  Filled 2019-01-28: qty 0.1

## 2019-01-28 MED ORDER — SODIUM CHLORIDE (PF) 0.9 % IJ SOLN
INTRAMUSCULAR | Status: AC
  Filled 2019-01-28: qty 50

## 2019-01-28 MED ORDER — IOHEXOL 300 MG/ML  SOLN
100.0000 mL | Freq: Once | INTRAMUSCULAR | Status: AC | PRN
  Administered 2019-01-28: 100 mL via INTRAVENOUS

## 2019-01-28 MED ORDER — MAGNESIUM SULFATE 2 GM/50ML IV SOLN
2.0000 g | Freq: Once | INTRAVENOUS | Status: AC
  Administered 2019-01-28: 2 g via INTRAVENOUS
  Filled 2019-01-28: qty 50

## 2019-01-28 NOTE — ED Notes (Addendum)
Date and time results received: 01/28/19 7:50 PM  Test: Glucose Critical Value: 514  Name of Provider Notified: Quintella Reichert, MD  Orders Received? Or Actions Taken?: MAR

## 2019-01-28 NOTE — ED Notes (Signed)
ID:[id number] (Wife)

## 2019-01-28 NOTE — ED Triage Notes (Addendum)
Pt was dx with COVID 01/03/19. Pt states that since then, he has become more fatigued. Pt's skin has yellow undertones. Pt has hx of cirrhosis.

## 2019-01-28 NOTE — ED Provider Notes (Signed)
[facility] DEPT Provider Note   CSN: [id number] Arrival date & time: 01/28/19  1718     History Chief Complaint  Patient presents with  . Fatigue    Patrick Kidd is a 72 y.o. male.  The history is provided by the patient and medical records. No language interpreter was used.   Patrick Kidd is a 73 y.o. male who presents to the Emergency Department complaining of icterus, shortness of breath. He presents to the emergency department complaining of turning yellow starting today. November 30 he began feeling poorly with fatigue and poor appetite it was diagnosed with COVID-19 infection. He was treated with a course of prednisone and began to feel better quickly. He then began experiencing progressive shortness of breath with dyspnea on exertion as well as profound fatigue, poor appetite. He has experienced three falls at home due to generalized weakness as well as dyspnea on exertion. Last fall was a few days ago. No reports of fevers, vomiting, diarrhea, hematochezia, melena. He has a history of diabetes as well as cirrhosis. He does not take any blood thinners. He drinks 1 to 2 glasses of vodka daily. He has been drinking less since he was diagnosed with COVID-19. He lives at home with his wife.    Past Medical History:  Diagnosis Date  . Acrophobia   . Alcoholic cirrhosis ([place]) 6/71/2458  . Anemia   . Arthritis    foot by big toe  . BPH associated with nocturia   . Cataract    removed both eyes  . Chronic cough    PMH of  . Diabetes mellitus without complication ([place])   . Diverticulosis 07-03-2010   Colonoscopy.   . Duodenal ulcer 2017  . Fallen arches    Bilateral  . GERD (gastroesophageal reflux disease)   . Gout   . Granuloma annulare   . Hx of adenomatous colonic polyps multiple  . Hydrocele 2011   Large septated right hydrocele  . Liver cyst   . Liver lesion   . Nonspecific elevation of levels of transaminase or lactic acid  dehydrogenase (LDH)   . Obesity   . Peripheral neuropathy   . Plantar fasciitis    PMH of  . Portal hypertension ([place]) 2017  . Prostate cancer ([place])   . Right shoulder pain 11/2017  . Sleep apnea    no cpap, patient denies  . Wears reading eyeglasses     Patient Active Problem List   Diagnosis Date Noted  . Weakness 11/22/2018  . Degenerative disc disease, cervical 07/21/2018  . Cervical radiculopathy 07/16/2018  . Mastoiditis, acute, right 07/06/2018  . OAB (overactive bladder) 07/06/2018  . Malignant neoplasm of prostate ([place]) 01/13/2018  . 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 Hyperlipidemia LDL goal <100 11/11/2017  . Essential hypertension 11/11/2017  . BPH associated with nocturia 11/11/2017  . Erectile dysfunction due to arterial insufficiency 04/07/2017  . Medicare annual wellness visit, subsequent 10/02/2016  . Peripheral vascular disease ([place]) 06/25/2016  . Alcoholic cirrhosis ([place]) 09/98/3382  . Liver disease 10/04/2015  . Type 2 diabetes mellitus with complication, with long-term current use of insulin ([place]) 08/22/2015  . Thrombocytopenia ([place]) 03/19/2014  . Hypersomnolence 03/19/2014  . Severe obesity with body mass index (BMI) of 35.0 to 39.9 with comorbidity ([place]) 07/19/2013  . Peripheral neuropathy 01/11/2013  . Polyclonal gammopathy 01/11/2013  . GERD 10/04/2009    Past Surgical History:  Procedure Laterality Date  . CATARACT  EXTRACTION, BILATERAL  12/2011    Dr Gershon Crane  . COLONOSCOPY  2017  . COLONOSCOPY W/ POLYPECTOMY  07/03/2010   2 adenomas, diverticulosis on right. Dr [name]  . CYSTOSCOPY N/A 03/15/2018   Procedure: CYSTOSCOPY FLEXIBLE;  Surgeon: [name], MD;  Location: [facility];  Service: Urology;  Laterality: N/A;  NO SEEDS FOUND IN BLADDER  . ESOPHAGOGASTRODUODENOSCOPY (EGD) WITH PROPOFOL N/A 01/08/2016   Procedure: ESOPHAGOGASTRODUODENOSCOPY (EGD) WITH PROPOFOL;  Surgeon:  [name], MD;  [facility];  Service: Endoscopy;  Laterality: N/A;  . FLEXIBLE SIGMOIDOSCOPY  2000  . RADIOACTIVE SEED IMPLANT N/A 03/15/2018   Procedure: RADIOACTIVE SEED IMPLANT/BRACHYTHERAPY IMPLANT;  Surgeon: [name], MD;  Location: [facility];  Service: Urology;  Laterality: N/A;   72 SEEDS IMPLANTED  . SIGMOIDOSCOPY    . SPACE OAR INSTILLATION N/A 03/15/2018   Procedure: SPACE OAR INSTILLATION;  Surgeon: [name], MD;  [facility];  Service: Urology;  Laterality: N/A;  . WISDOM TOOTH EXTRACTION         Family History  Problem Relation Age of Onset  . Lung cancer Mother        smoker  . Leukemia Father        Acute myelocytic  . Diabetes Neg Hx   . Stroke Neg Hx   . Heart disease Neg Hx   . Colon cancer Neg Hx   . Colon polyps Neg Hx   . Esophageal cancer Neg Hx   . Rectal cancer Neg Hx   . Stomach cancer Neg Hx     Social History   Tobacco Use  . Smoking status: Former Smoker    Packs/day: 2.00    Years: 6.00    Pack years: 12.00    Types: Cigarettes    Quit date: 02/04/1971    Years since quitting: 48.0  . Smokeless tobacco: Never Used  . Tobacco comment: smoked age [age], up to 2 ppd  Substance Use Topics  . Alcohol use: Yes    Alcohol/week: 28.0 standard drinks    Types: 28 Shots of liquor per week    Comment: 2-3 vodka martinins a day  . Drug use: No    Home Medications Prior to Admission medications   Medication Sig Start Date End Date Taking? Authorizing Provider  cyclobenzaprine (FLEXERIL) 5 MG tablet Take 1 to 2 tabs at bedtime if needed. Patient taking differently: Take 5 mg by mouth 3 (three) times daily as needed for muscle spasms.  01/10/19  Yes [name], MD  gabapentin (NEURONTIN) 300 MG capsule Take 1 capsule (300 mg total) by mouth 2 (two) times daily. Patient taking differently: Take 300 mg by mouth daily.  08/24/18  Yes Jaffe, Adam R, DO  ibuprofen (ADVIL) 200  MG tablet Take 400 mg by mouth every 6 (six) hours as needed for moderate pain.   Yes [provider]  metFORMIN (GLUCOPHAGE-XR) 500 MG 24 hr tablet TAKE 1 TABLET(500 MG) BY MOUTH DAILY WITH BREAKFAST Patient taking differently: Take 500 mg by mouth daily with breakfast.  04/20/18  Yes [name], MD  pantoprazole (PROTONIX) 40 MG tablet TAKE 1 TABLET(40 MG) BY MOUTH TWICE DAILY Patient taking differently: Take 40 mg by mouth 2 (two) times daily.  03/17/18  Yes Gatha Mayer, MD  traMADol (ULTRAM) 50 MG tablet Take 1 tablet (50 mg total) by mouth every 8 (eight) hours as needed for up to  5 days. Patient taking differently: Take 50 mg by mouth every 8 (eight) hours as needed for moderate pain.  01/25/19 01/30/19 Yes Marrian Salvage, FNP  blood glucose meter kit and supplies KIT Use to test blood sugar once daily. DX E11.8 12/21/17   [name], MD  Blood Glucose Monitoring Suppl (FREESTYLE FREEDOM LITE) w/Device KIT Use to check blood sugars 2 times daily 01/14/16   [name], FNP  glucose blood (COOL BLOOD GLUCOSE TEST STRIPS) test strip Use to check blood sugar daily. DX: E11.8 12/21/17   [name], MD  Lancets (ACCU-CHEK MULTICLIX) lancets Use to check sugar daily. DX: E11.8 12/21/17   [name], MD  mirabegron ER (MYRBETRIQ) 25 MG TB24 tablet Take 1 tablet (25 mg total) by mouth daily. Patient not taking: Reported on 01/28/2019 01/10/19   [name], MD    Allergies    Patient has no known allergies.  Review of Systems   Review of Systems  All other systems reviewed and are negative.   Physical Exam Updated Vital Signs BP (!) 141/69 (BP Location: Left Arm)   Pulse 100   Temp 98.1 F (36.7 C) (Oral)   Resp (!) 24   Ht 6' 2" (1.88 m)   Wt 106.6 kg   SpO2 97%   BMI 30.17 kg/m   Physical Exam Vitals and nursing note reviewed.  Constitutional:      Appearance: He is well-developed.  HENT:     Head: Normocephalic and atraumatic.    Eyes:     General: Scleral icterus present.  Cardiovascular:     Rate and Rhythm: Normal rate and regular rhythm.  Pulmonary:     Effort: Pulmonary effort is normal. No respiratory distress.  Abdominal:     General: There is distension.     Palpations: Abdomen is soft.     Tenderness: There is no abdominal tenderness. There is no guarding or rebound.     Comments: Ecchymosis to right flank  Musculoskeletal:        General: No tenderness.     Comments: Trace edema to bilateral lower extremities  Skin:    General: Skin is warm and dry.  Neurological:     Mental Status: He is alert and oriented to person, place, and time.  Psychiatric:        Behavior: Behavior normal.     ED Results / Procedures / Treatments   Labs (all labs ordered are listed, but only abnormal results are displayed) Labs Reviewed  CBC WITH DIFFERENTIAL/PLATELET - Abnormal; Notable for the following components:      Result Value   WBC 15.4 (*)    RBC 3.79 (*)    Hemoglobin 12.8 (*)    HCT 36.4 (*)    Platelets 97 (*)    Neutro Abs 12.2 (*)    Monocytes Absolute 1.3 (*)    Abs Immature Granulocytes 0.36 (*)    All other components within normal limits  PROTIME-INR - Abnormal; Notable for the following components:   Prothrombin Time 16.2 (*)    INR 1.3 (*)    All other components within normal limits  MAGNESIUM - Abnormal; Notable for the following components:   Magnesium 1.5 (*)    All other components within normal limits  BASIC METABOLIC PANEL - Abnormal; Notable for the following components:   Sodium 124 (*)    Chloride 86 (*)    Glucose, Bld 514 (*)    BUN 29 (*)  Calcium 8.3 (*)    All other components within normal limits  HEPATIC FUNCTION PANEL - Abnormal; Notable for the following components:   Total Protein 6.0 (*)    Albumin 2.1 (*)    AST 46 (*)    Alkaline Phosphatase 316 (*)    Total Bilirubin 15.7 (*)    Bilirubin, Direct 11.2 (*)    Indirect Bilirubin 4.5 (*)    All other  components within normal limits  CBG MONITORING, ED - Abnormal; Notable for the following components:   Glucose-Capillary 431 (*)    All other components within normal limits  CULTURE, BLOOD (ROUTINE X 2)  CULTURE, BLOOD (ROUTINE X 2)  URINALYSIS, ROUTINE W REFLEX MICROSCOPIC    EKG EKG Interpretation  Date/Time:  Friday January 28 2019 17:46:21 EST Ventricular Rate:  97 PR Interval:    QRS Duration: 92 QT Interval:  370 QTC Calculation: 470 R Axis:   39 Text Interpretation: Sinus tachycardia Atrial premature complexes Low voltage, precordial leads Anteroseptal infarct, old Baseline wander in lead(s) V2 Confirmed by Quintella Reichert [phone]) on 01/28/2019 8:00:47 PM   Radiology DG Chest Port 1 View  Result Date: 01/28/2019 CLINICAL DATA:  Shortness of breath, diagnosed with COVID-19 on 01/03/2019, increased fatigue, jaundice, cirrhosis EXAM: PORTABLE CHEST 1 VIEW COMPARISON:  Portable exam 1759 hours compared to 02/19/2018 FINDINGS: Normal heart size, mediastinal contours, and pulmonary vascularity. Atherosclerotic calcification aorta. Mild LEFT basilar atelectasis. Lungs otherwise clear. No pulmonary infiltrate, pleural effusion or pneumothorax. Multilevel endplate spur formation thoracic spine. IMPRESSION: Mild LEFT basilar atelectasis. Electronically Signed   By: Lavonia Dana M.D.   On: 01/28/2019 18:17    Procedures Procedures (including critical care time)  Medications Ordered in ED Medications  piperacillin-tazobactam (ZOSYN) IVPB 3.375 g (has no administration in time range)  magnesium sulfate IVPB 2 g 50 mL (0 g Intravenous Stopped 01/28/19 2055)  insulin aspart (novoLOG) injection 10 Units (10 Units Subcutaneous Given 01/28/19 2149)  iohexol (OMNIPAQUE) 300 MG/ML solution 100 mL (100 mLs Intravenous Contrast Given 01/28/19 2256)  sodium chloride (PF) 0.9 % injection (  Given by Other 01/29/19 0021)    ED Course  I have reviewed the triage vital signs and the nursing  notes.  Pertinent labs & imaging results that were available during my care of the patient were reviewed by me and considered in my medical decision making (see chart for details).    MDM Rules/Calculators/A&P                      Pt with hx/o cirrhosis, recent COVID-19 infection here for evaluation of painless jaundice. He has experienced falls at home and has right flank ecchymosis. Labs significant for elevated bilirubin to 15. CT scan demonstrates gallbladder dilation with pericholecystic fluid. Patient has no tenderness in this area. Labs do demonstrate leukocytosis as well as hyperglycemia, hypomagnesemia.  Mag replaced, provided insulin.  Providing abx for possible developing infection. Plan to obtain right upper quadrant ultrasound to further evaluate. Hospitalist consulted for admission. Patient updated of findings of studies and recommendation for admission and he is in agreement with treatment plan.  Final Clinical Impression(s) / ED Diagnoses Final diagnoses:  Jaundice    Rx / DC Orders ED Discharge Orders    None       Quintella Reichert, MD 01/29/19 [phone]

## 2019-01-29 ENCOUNTER — Emergency Department (HOSPITAL_COMMUNITY): Payer: Medicare Other

## 2019-01-29 ENCOUNTER — Observation Stay (HOSPITAL_COMMUNITY): Payer: Medicare Other

## 2019-01-29 DIAGNOSIS — Z8711 Personal history of peptic ulcer disease: Secondary | ICD-10-CM | POA: Diagnosis not present

## 2019-01-29 DIAGNOSIS — R05 Cough: Secondary | ICD-10-CM | POA: Diagnosis not present

## 2019-01-29 DIAGNOSIS — I499 Cardiac arrhythmia, unspecified: Secondary | ICD-10-CM | POA: Diagnosis not present

## 2019-01-29 DIAGNOSIS — Z794 Long term (current) use of insulin: Secondary | ICD-10-CM

## 2019-01-29 DIAGNOSIS — G9340 Encephalopathy, unspecified: Secondary | ICD-10-CM | POA: Diagnosis not present

## 2019-01-29 DIAGNOSIS — Z6831 Body mass index (BMI) 31.0-31.9, adult: Secondary | ICD-10-CM | POA: Diagnosis not present

## 2019-01-29 DIAGNOSIS — R188 Other ascites: Secondary | ICD-10-CM | POA: Diagnosis not present

## 2019-01-29 DIAGNOSIS — D72828 Other elevated white blood cell count: Secondary | ICD-10-CM | POA: Diagnosis not present

## 2019-01-29 DIAGNOSIS — K8000 Calculus of gallbladder with acute cholecystitis without obstruction: Secondary | ICD-10-CM | POA: Diagnosis present

## 2019-01-29 DIAGNOSIS — K810 Acute cholecystitis: Secondary | ICD-10-CM | POA: Diagnosis not present

## 2019-01-29 DIAGNOSIS — K219 Gastro-esophageal reflux disease without esophagitis: Secondary | ICD-10-CM

## 2019-01-29 DIAGNOSIS — M6281 Muscle weakness (generalized): Secondary | ICD-10-CM | POA: Diagnosis not present

## 2019-01-29 DIAGNOSIS — I456 Pre-excitation syndrome: Secondary | ICD-10-CM | POA: Diagnosis not present

## 2019-01-29 DIAGNOSIS — K7460 Unspecified cirrhosis of liver: Secondary | ICD-10-CM | POA: Diagnosis not present

## 2019-01-29 DIAGNOSIS — K7031 Alcoholic cirrhosis of liver with ascites: Secondary | ICD-10-CM | POA: Diagnosis not present

## 2019-01-29 DIAGNOSIS — R7401 Elevation of levels of liver transaminase levels: Secondary | ICD-10-CM | POA: Diagnosis not present

## 2019-01-29 DIAGNOSIS — Z7401 Bed confinement status: Secondary | ICD-10-CM | POA: Diagnosis not present

## 2019-01-29 DIAGNOSIS — F10231 Alcohol dependence with withdrawal delirium: Secondary | ICD-10-CM | POA: Diagnosis not present

## 2019-01-29 DIAGNOSIS — R17 Unspecified jaundice: Secondary | ICD-10-CM | POA: Diagnosis not present

## 2019-01-29 DIAGNOSIS — I4891 Unspecified atrial fibrillation: Secondary | ICD-10-CM | POA: Diagnosis not present

## 2019-01-29 DIAGNOSIS — Z8616 Personal history of COVID-19: Secondary | ICD-10-CM | POA: Diagnosis not present

## 2019-01-29 DIAGNOSIS — E1140 Type 2 diabetes mellitus with diabetic neuropathy, unspecified: Secondary | ICD-10-CM | POA: Diagnosis not present

## 2019-01-29 DIAGNOSIS — K766 Portal hypertension: Secondary | ICD-10-CM | POA: Diagnosis not present

## 2019-01-29 DIAGNOSIS — D684 Acquired coagulation factor deficiency: Secondary | ICD-10-CM | POA: Diagnosis present

## 2019-01-29 DIAGNOSIS — E871 Hypo-osmolality and hyponatremia: Secondary | ICD-10-CM | POA: Diagnosis present

## 2019-01-29 DIAGNOSIS — R945 Abnormal results of liver function studies: Secondary | ICD-10-CM | POA: Insufficient documentation

## 2019-01-29 DIAGNOSIS — Z4682 Encounter for fitting and adjustment of non-vascular catheter: Secondary | ICD-10-CM | POA: Diagnosis not present

## 2019-01-29 DIAGNOSIS — E1142 Type 2 diabetes mellitus with diabetic polyneuropathy: Secondary | ICD-10-CM | POA: Diagnosis present

## 2019-01-29 DIAGNOSIS — J9811 Atelectasis: Secondary | ICD-10-CM | POA: Diagnosis present

## 2019-01-29 DIAGNOSIS — Z888 Allergy status to other drugs, medicaments and biological substances status: Secondary | ICD-10-CM | POA: Diagnosis not present

## 2019-01-29 DIAGNOSIS — U071 COVID-19: Secondary | ICD-10-CM | POA: Diagnosis present

## 2019-01-29 DIAGNOSIS — Z20822 Contact with and (suspected) exposure to covid-19: Secondary | ICD-10-CM | POA: Diagnosis present

## 2019-01-29 DIAGNOSIS — K652 Spontaneous bacterial peritonitis: Secondary | ICD-10-CM | POA: Diagnosis not present

## 2019-01-29 DIAGNOSIS — R7989 Other specified abnormal findings of blood chemistry: Secondary | ICD-10-CM | POA: Diagnosis not present

## 2019-01-29 DIAGNOSIS — M2550 Pain in unspecified joint: Secondary | ICD-10-CM | POA: Diagnosis not present

## 2019-01-29 DIAGNOSIS — D72829 Elevated white blood cell count, unspecified: Secondary | ICD-10-CM | POA: Diagnosis not present

## 2019-01-29 DIAGNOSIS — Z23 Encounter for immunization: Secondary | ICD-10-CM | POA: Diagnosis not present

## 2019-01-29 DIAGNOSIS — N179 Acute kidney failure, unspecified: Secondary | ICD-10-CM | POA: Diagnosis not present

## 2019-01-29 DIAGNOSIS — N401 Enlarged prostate with lower urinary tract symptoms: Secondary | ICD-10-CM | POA: Diagnosis present

## 2019-01-29 DIAGNOSIS — E118 Type 2 diabetes mellitus with unspecified complications: Secondary | ICD-10-CM | POA: Diagnosis not present

## 2019-01-29 DIAGNOSIS — E1165 Type 2 diabetes mellitus with hyperglycemia: Secondary | ICD-10-CM | POA: Diagnosis present

## 2019-01-29 DIAGNOSIS — I10 Essential (primary) hypertension: Secondary | ICD-10-CM

## 2019-01-29 DIAGNOSIS — K7290 Hepatic failure, unspecified without coma: Secondary | ICD-10-CM | POA: Diagnosis not present

## 2019-01-29 DIAGNOSIS — Z1611 Resistance to penicillins: Secondary | ICD-10-CM | POA: Diagnosis present

## 2019-01-29 LAB — CBC
HCT: 38.9 % — ABNORMAL LOW (ref 39.0–52.0)
HCT: 38.9 % — ABNORMAL LOW (ref 39.0–52.0)
Hemoglobin: 13.7 g/dL (ref 13.0–17.0)
Hemoglobin: 13.4 g/dL (ref 13.0–17.0)
MCH: 33.7 pg (ref 26.0–34.0)
MCH: 33.5 pg (ref 26.0–34.0)
MCHC: 35.2 g/dL (ref 30.0–36.0)
MCHC: 34.4 g/dL (ref 30.0–36.0)
MCV: 95.8 fL (ref 80.0–100.0)
MCV: 97.3 fL (ref 80.0–100.0)
Platelets: 151 10*3/uL (ref 150–400)
Platelets: 133 10*3/uL — ABNORMAL LOW (ref 150–400)
RBC: 4.06 MIL/uL — ABNORMAL LOW (ref 4.22–5.81)
RBC: 4 MIL/uL — ABNORMAL LOW (ref 4.22–5.81)
RDW: 13.3 % (ref 11.5–15.5)
RDW: 13.2 % (ref 11.5–15.5)
WBC: 15.8 10*3/uL — ABNORMAL HIGH (ref 4.0–10.5)
WBC: 12.5 10*3/uL — ABNORMAL HIGH (ref 4.0–10.5)
nRBC: 0 % (ref 0.0–0.2)
nRBC: 0 % (ref 0.0–0.2)

## 2019-01-29 LAB — ECHOCARDIOGRAM COMPLETE
Height: 74 in
Weight: 3760 oz

## 2019-01-29 LAB — PROCALCITONIN: Procalcitonin: 3.89 ng/mL

## 2019-01-29 LAB — URINALYSIS, ROUTINE W REFLEX MICROSCOPIC
Bacteria, UA: NONE SEEN
Cellular Cast, UA: 3
Glucose, UA: >=500 mg/dL — AB
Hgb urine dipstick: NEGATIVE
Ketones, ur: NEGATIVE mg/dL
Leukocytes,Ua: NEGATIVE
Nitrite: NEGATIVE
Protein, ur: NEGATIVE mg/dL
Specific Gravity, Urine: 1.026 (ref 1.005–1.030)
pH: 5 (ref 5.0–8.0)

## 2019-01-29 LAB — TROPONIN I (HIGH SENSITIVITY)
Troponin I (High Sensitivity): 18 ng/L — ABNORMAL HIGH (ref <18)
Troponin I (High Sensitivity): 17 ng/L (ref <18)

## 2019-01-29 LAB — FERRITIN: Ferritin: 996 ng/mL — ABNORMAL HIGH (ref 24–336)

## 2019-01-29 LAB — HEPATITIS PANEL, ACUTE
HCV Ab: NONREACTIVE
Hep A IgM: NONREACTIVE
Hep B C IgM: NONREACTIVE
Hepatitis B Surface Ag: NONREACTIVE

## 2019-01-29 LAB — COMPREHENSIVE METABOLIC PANEL
ALT: 37 U/L (ref 0–44)
AST: 51 U/L — ABNORMAL HIGH (ref 15–41)
Albumin: 2.1 g/dL — ABNORMAL LOW (ref 3.5–5.0)
Alkaline Phosphatase: 301 U/L — ABNORMAL HIGH (ref 38–126)
Anion gap: 14 (ref 5–15)
BUN: 28 mg/dL — ABNORMAL HIGH (ref 8–23)
CO2: 26 mmol/L (ref 22–32)
Calcium: 8.2 mg/dL — ABNORMAL LOW (ref 8.9–10.3)
Chloride: 88 mmol/L — ABNORMAL LOW (ref 98–111)
Creatinine, Ser: 1.04 mg/dL (ref 0.61–1.24)
GFR calc Af Amer: >60 mL/min (ref >60)
GFR calc non Af Amer: >60 mL/min (ref >60)
Glucose, Bld: 300 mg/dL — ABNORMAL HIGH (ref 70–99)
Potassium: 3.8 mmol/L (ref 3.5–5.1)
Sodium: 128 mmol/L — ABNORMAL LOW (ref 135–145)
Total Bilirubin: 15.6 mg/dL — ABNORMAL HIGH (ref 0.3–1.2)
Total Protein: 6.1 g/dL — ABNORMAL LOW (ref 6.5–8.1)

## 2019-01-29 LAB — ACETAMINOPHEN LEVEL: Acetaminophen (Tylenol), Serum: <10 ug/mL — ABNORMAL LOW (ref 10–30)

## 2019-01-29 LAB — CBG MONITORING, ED
Glucose-Capillary: 335 mg/dL — ABNORMAL HIGH (ref 70–99)
Glucose-Capillary: 278 mg/dL — ABNORMAL HIGH (ref 70–99)
Glucose-Capillary: 290 mg/dL — ABNORMAL HIGH (ref 70–99)
Glucose-Capillary: 282 mg/dL — ABNORMAL HIGH (ref 70–99)

## 2019-01-29 LAB — GLUCOSE, CAPILLARY
Glucose-Capillary: 250 mg/dL — ABNORMAL HIGH (ref 70–99)
Glucose-Capillary: 263 mg/dL — ABNORMAL HIGH (ref 70–99)
Glucose-Capillary: 304 mg/dL — ABNORMAL HIGH (ref 70–99)

## 2019-01-29 LAB — CK TOTAL AND CKMB (NOT AT ARMC)
CK, MB: 1.6 ng/mL (ref 0.5–5.0)
Relative Index: INVALID (ref 0.0–2.5)
Total CK: 12 U/L — ABNORMAL LOW (ref 49–397)

## 2019-01-29 LAB — FIBRINOGEN: Fibrinogen: >800 mg/dL — ABNORMAL HIGH (ref 210–475)

## 2019-01-29 LAB — C-REACTIVE PROTEIN: CRP: 27.2 mg/dL — ABNORMAL HIGH (ref <1.0)

## 2019-01-29 LAB — PROTIME-INR
INR: 1.3 — ABNORMAL HIGH (ref 0.8–1.2)
Prothrombin Time: 15.6 seconds — ABNORMAL HIGH (ref 11.4–15.2)

## 2019-01-29 LAB — SALICYLATE LEVEL: Salicylate Lvl: <7 mg/dL — ABNORMAL LOW (ref 7.0–30.0)

## 2019-01-29 LAB — AMMONIA: Ammonia: 31 umol/L (ref 9–35)

## 2019-01-29 LAB — OSMOLALITY: Osmolality: 292 mOsm/kg (ref 275–295)

## 2019-01-29 LAB — D-DIMER, QUANTITATIVE: D-Dimer, Quant: 3.7 ug/mL-FEU — ABNORMAL HIGH (ref 0.00–0.50)

## 2019-01-29 LAB — CORTISOL: Cortisol, Plasma: 27.4 ug/dL

## 2019-01-29 LAB — LACTATE DEHYDROGENASE: LDH: 127 U/L (ref 98–192)

## 2019-01-29 LAB — TSH: TSH: 1.83 u[IU]/mL (ref 0.350–4.500)

## 2019-01-29 MED ORDER — LORAZEPAM 2 MG/ML IJ SOLN: 0.0000 mg | Freq: Four times a day (QID) | INTRAMUSCULAR | Status: AC

## 2019-01-29 MED ORDER — PIPERACILLIN-TAZOBACTAM 3.375 G IVPB
3.3750 g | Freq: Three times a day (TID) | INTRAVENOUS | Status: DC
  Administered 2019-01-29 – 2019-01-31 (×8): 3.375 g via INTRAVENOUS
  Filled 2019-01-29 (×8): qty 50

## 2019-01-29 MED ORDER — THIAMINE HCL 100 MG/ML IJ SOLN
100.0000 mg | Freq: Every day | INTRAMUSCULAR | Status: DC
  Administered 2019-02-01 – 2019-02-04 (×3): 100 mg via INTRAVENOUS
  Filled 2019-01-29 (×5): qty 2

## 2019-01-29 MED ORDER — SODIUM CHLORIDE 0.9 % IV SOLN: INTRAVENOUS | Status: AC

## 2019-01-29 MED ORDER — HYDRALAZINE HCL 20 MG/ML IJ SOLN
10.0000 mg | Freq: Four times a day (QID) | INTRAMUSCULAR | Status: DC | PRN
  Administered 2019-02-01 – 2019-02-06 (×4): 10 mg via INTRAVENOUS
  Filled 2019-01-29 (×4): qty 1

## 2019-01-29 MED ORDER — SODIUM CHLORIDE 0.9 % IV SOLN
250.0000 mL | INTRAVENOUS | Status: DC | PRN
  Administered 2019-01-30: 250 mL via INTRAVENOUS

## 2019-01-29 MED ORDER — PHYTONADIONE 5 MG PO TABS
5.0000 mg | ORAL_TABLET | Freq: Every day | ORAL | Status: AC
  Administered 2019-01-29 – 2019-01-31 (×3): 5 mg via ORAL
  Filled 2019-01-29 (×3): qty 1

## 2019-01-29 MED ORDER — SODIUM CHLORIDE 0.9% FLUSH: 3.0000 mL | INTRAVENOUS | Status: DC | PRN

## 2019-01-29 MED ORDER — HYDROMORPHONE HCL 1 MG/ML IJ SOLN
1.0000 mg | INTRAMUSCULAR | Status: DC | PRN
  Administered 2019-01-29 – 2019-01-30 (×3): 1 mg via INTRAVENOUS
  Filled 2019-01-29 (×3): qty 1

## 2019-01-29 MED ORDER — TRAMADOL HCL 50 MG PO TABS
50.0000 mg | ORAL_TABLET | Freq: Three times a day (TID) | ORAL | Status: DC | PRN
  Administered 2019-01-29 – 2019-01-30 (×3): 50 mg via ORAL
  Filled 2019-01-29 (×3): qty 1

## 2019-01-29 MED ORDER — FOLIC ACID 1 MG PO TABS
1.0000 mg | ORAL_TABLET | Freq: Every day | ORAL | Status: DC
  Administered 2019-01-29 – 2019-01-31 (×3): 1 mg via ORAL
  Filled 2019-01-29 (×3): qty 1

## 2019-01-29 MED ORDER — LACTULOSE 10 GM/15ML PO SOLN
20.0000 g | Freq: Two times a day (BID) | ORAL | Status: DC
  Administered 2019-01-29 – 2019-01-31 (×6): 20 g via ORAL
  Filled 2019-01-29 (×6): qty 30

## 2019-01-29 MED ORDER — PIPERACILLIN-TAZOBACTAM 3.375 G IVPB 30 MIN
3.3750 g | Freq: Once | INTRAVENOUS | Status: AC
  Administered 2019-01-29: 3.375 g via INTRAVENOUS
  Filled 2019-01-29: qty 50

## 2019-01-29 MED ORDER — LORAZEPAM 2 MG/ML IJ SOLN
0.0000 mg | Freq: Two times a day (BID) | INTRAMUSCULAR | Status: AC
  Administered 2019-02-01: 1 mg via INTRAVENOUS
  Administered 2019-02-01: 2 mg via INTRAVENOUS
  Filled 2019-01-29 (×2): qty 1

## 2019-01-29 MED ORDER — INFLUENZA VAC A&B SA ADJ QUAD 0.5 ML IM PRSY
0.5000 mL | PREFILLED_SYRINGE | INTRAMUSCULAR | Status: AC
  Administered 2019-01-30: 0.5 mL via INTRAMUSCULAR
  Filled 2019-01-29: qty 0.5

## 2019-01-29 MED ORDER — PERFLUTREN LIPID MICROSPHERE
1.0000 mL | INTRAVENOUS | Status: AC | PRN
  Administered 2019-01-29: 2 mL via INTRAVENOUS
  Filled 2019-01-29: qty 10

## 2019-01-29 MED ORDER — PANTOPRAZOLE SODIUM 40 MG PO TBEC
40.0000 mg | DELAYED_RELEASE_TABLET | Freq: Two times a day (BID) | ORAL | Status: DC
  Administered 2019-01-29 – 2019-01-31 (×6): 40 mg via ORAL
  Filled 2019-01-29 (×7): qty 1

## 2019-01-29 MED ORDER — LORAZEPAM 2 MG/ML IJ SOLN
1.0000 mg | INTRAMUSCULAR | Status: AC | PRN
  Administered 2019-02-01: 2 mg via INTRAVENOUS
  Filled 2019-01-29: qty 1

## 2019-01-29 MED ORDER — THIAMINE HCL 100 MG PO TABS
100.0000 mg | ORAL_TABLET | Freq: Every day | ORAL | Status: DC
  Administered 2019-01-29 – 2019-02-09 (×9): 100 mg via ORAL
  Filled 2019-01-29 (×10): qty 1

## 2019-01-29 MED ORDER — ADULT MULTIVITAMIN W/MINERALS CH
1.0000 | ORAL_TABLET | Freq: Every day | ORAL | Status: DC
  Administered 2019-01-29 – 2019-01-31 (×3): 1 via ORAL
  Filled 2019-01-29 (×3): qty 1

## 2019-01-29 MED ORDER — SODIUM CHLORIDE 0.9% FLUSH
3.0000 mL | Freq: Two times a day (BID) | INTRAVENOUS | Status: DC
  Administered 2019-01-29 – 2019-02-08 (×17): 3 mL via INTRAVENOUS

## 2019-01-29 MED ORDER — SODIUM CHLORIDE 0.9 % IV SOLN: INTRAVENOUS | Status: DC

## 2019-01-29 MED ORDER — LORAZEPAM 1 MG PO TABS
1.0000 mg | ORAL_TABLET | ORAL | Status: AC | PRN
  Administered 2019-01-29 (×2): 1 mg via ORAL
  Filled 2019-01-29 (×2): qty 1

## 2019-01-29 MED ORDER — INSULIN ASPART 100 UNIT/ML ~~LOC~~ SOLN
0.0000 [IU] | SUBCUTANEOUS | Status: DC
  Administered 2019-01-29 (×2): 5 [IU] via SUBCUTANEOUS
  Administered 2019-01-29 (×2): 7 [IU] via SUBCUTANEOUS
  Administered 2019-01-29: 5 [IU] via SUBCUTANEOUS
  Administered 2019-01-30: 3 [IU] via SUBCUTANEOUS
  Administered 2019-01-30: 2 [IU] via SUBCUTANEOUS
  Administered 2019-01-30: 3 [IU] via SUBCUTANEOUS
  Administered 2019-01-30: 5 [IU] via SUBCUTANEOUS
  Administered 2019-01-30: 3 [IU] via SUBCUTANEOUS
  Administered 2019-01-30: 5 [IU] via SUBCUTANEOUS
  Administered 2019-01-31 (×3): 3 [IU] via SUBCUTANEOUS
  Administered 2019-01-31: 2 [IU] via SUBCUTANEOUS
  Administered 2019-01-31: 3 [IU] via SUBCUTANEOUS
  Administered 2019-01-31: 2 [IU] via SUBCUTANEOUS
  Administered 2019-02-01 (×2): 1 [IU] via SUBCUTANEOUS
  Administered 2019-02-01: 2 [IU] via SUBCUTANEOUS
  Administered 2019-02-01 (×2): 1 [IU] via SUBCUTANEOUS
  Administered 2019-02-01 (×2): 3 [IU] via SUBCUTANEOUS
  Administered 2019-02-02: 2 [IU] via SUBCUTANEOUS
  Administered 2019-02-02 (×2): 1 [IU] via SUBCUTANEOUS
  Administered 2019-02-02 – 2019-02-03 (×3): 2 [IU] via SUBCUTANEOUS
  Administered 2019-02-03: 1 [IU] via SUBCUTANEOUS
  Administered 2019-02-03: 2 [IU] via SUBCUTANEOUS
  Administered 2019-02-03 (×2): 1 [IU] via SUBCUTANEOUS
  Administered 2019-02-03: 2 [IU] via SUBCUTANEOUS
  Administered 2019-02-03 – 2019-02-04 (×2): 1 [IU] via SUBCUTANEOUS
  Administered 2019-02-04: 3 [IU] via SUBCUTANEOUS
  Administered 2019-02-04: 1 [IU] via SUBCUTANEOUS
  Administered 2019-02-04: 17:00:00 3 [IU] via SUBCUTANEOUS
  Administered 2019-02-05: 05:00:00 2 [IU] via SUBCUTANEOUS
  Administered 2019-02-05: 5 [IU] via SUBCUTANEOUS
  Administered 2019-02-05 (×3): 2 [IU] via SUBCUTANEOUS
  Administered 2019-02-05: 7 [IU] via SUBCUTANEOUS
  Administered 2019-02-06: 18:00:00 2 [IU] via SUBCUTANEOUS
  Administered 2019-02-06 (×3): 3 [IU] via SUBCUTANEOUS
  Administered 2019-02-06: 09:00:00 2 [IU] via SUBCUTANEOUS
  Administered 2019-02-06: 21:00:00 3 [IU] via SUBCUTANEOUS
  Administered 2019-02-07: 1 [IU] via SUBCUTANEOUS
  Administered 2019-02-07: 5 [IU] via SUBCUTANEOUS
  Administered 2019-02-07: 21:00:00 2 [IU] via SUBCUTANEOUS
  Administered 2019-02-08 (×2): 3 [IU] via SUBCUTANEOUS
  Administered 2019-02-08 (×3): 2 [IU] via SUBCUTANEOUS
  Administered 2019-02-08: 3 [IU] via SUBCUTANEOUS
  Administered 2019-02-09: 2 [IU] via SUBCUTANEOUS
  Administered 2019-02-09: 3 [IU] via SUBCUTANEOUS
  Administered 2019-02-09: 2 [IU] via SUBCUTANEOUS
  Administered 2019-02-09: 3 [IU] via SUBCUTANEOUS
  Filled 2019-01-29: qty 0.09

## 2019-01-29 NOTE — ED Notes (Signed)
Messaged Dr. Bobby Rumpf regarding patient covid status and positive result on 01/03/19.  Dr. Johnnye Sima responded in message stating "don't retest; don't isolate; treat as immune".

## 2019-01-29 NOTE — ED Notes (Signed)
Patient has been cleaned and changed.

## 2019-01-29 NOTE — Consult Note (Addendum)
[facility] Consult: 10:34 AM 01/29/2019  LOS: 0 days    Referring Provider: Dr Tana Coast  Primary Care Physician:  Patrick Lima, MD Primary Gastroenterologist:  Dr. Carlean Kidd  Wife phone number is [phone]   Reason for Consultation: Jaundice, decompensated liver disease   HPI: Patrick Kidd is a 73 y.o. male.  Hx DN 2.  Alcoholic cirrhosis.  GERD.  Sleep apnea.  Diabetes.  Prostate cancer, Gleason's 3/4 dx 12/2017, treated with radiation seed implantation 03/2018  10/2015 EGD. C/o dysphagia, variceal screening.  Benign stenosis at GE junction was [facility] dilated.  Gastritis.  Otherwise normal study.  Path: Benign, chronic, inactive gastritis, no H. pylori.  10/2015 Colonoscopy.  For surveillance of personal history colon polyps (2012).  Several polyps removed from ascending and descending colon.  Path: Tubular adenoma, no high-grade dysplasia or malignancy. 01/2016 EGD for CGE, abdominal pain.  Nonbleeding duodenal ulcer with visible vessel was injected and bicapped.  Biopsied portal hypertensive gastropathy. AFP 3.9 on 11/11/2017. 07/2018 MRI of liver for follow-up of liver lesion.  Stable 7 mm lesion in anterior dome of liver suggesting benign etiology such as regenerative nodule.  Cirrhosis, splenomegaly.  Stable liver cyst.  Tested COVID-19 + November 30.  Diagnosed and discharged from the ED that day with prescription for prednisone 20 mg bid.  He is no longer taking prednisone and was feeling great as of 01/10/2019 phone interaction.  On phone call of 12/22 patient reported overall better but lacking stamina.  Seen by orthopedist last week regarding knee pain, arthritis.  He is taken at least a couple of falls in the past 3 weeks, tilting and left rib pain and bruising.  PMD added limited dosing of tramadol  12/22 Wife tells me patient has been sleeping a lot more.  He has had some foggy mental thinking but no overt disorientation or confusion.  He has not been eating as well though not having abdominal pain or nausea/vomiting.  Regular brown stools on a daily basis.  Drinking vodka, 1/2 gallon may last him only 2 or 3 days.  Not using acetaminophen but uses Aleve a few times a week for headaches emanating from cervical spine neuropathy.  Unsteady on his feet and weak.    Yesterday morning his wife noticed his eyes were yellowing and his urine was darker than usual.  He complained of upper abdominal pain.  EMS brought him to the hospital.  T bili 15.7, alkaline phosphatase 301, AST/ALT 51/37.   INR 1.3. Platelets 97 >> 133 (56 on 11/22/18) WBCs 15.8.Marland Kitchen Blood glucose 514 CTAP w contrast: Mild gallbladder distention, small peri-cholecystic fluid and inflammatory gallbladder changes, could reflect cholecystitis.  Hepatomegaly, cirrhosis.  Small perihepatic ascites.  Stable 6 cm hepatic cyst.  Aortic atherosclerosis Abdominal ultrasound: Gallbladder stones and sludge with edematous, thickened gallbladder wall.  3.7 mm CBD.  Anechoic cyst in right liver lobe.  Main portal vein patent.  Patient is currently confused and denies any recent abdominal pain.  Got the bulk of his history from talking with his wife and son  by phone.   Past Medical History:  Diagnosis Date   Acrophobia    Alcoholic cirrhosis ([place]) 7/86/7672   Anemia    Arthritis    foot by big toe   BPH associated with nocturia    Cataract    removed both eyes   Chronic cough    PMH of   Diabetes mellitus without complication ([place])    Diverticulosis 07-03-2010   Colonoscopy.    Duodenal ulcer 2017   Fallen arches    Bilateral   GERD (gastroesophageal reflux disease)    Gout    Granuloma annulare    Hx of adenomatous colonic polyps multiple   Hydrocele 2011   Large septated right hydrocele   Liver cyst    Liver  lesion    Nonspecific elevation of levels of transaminase or lactic acid dehydrogenase (LDH)    Obesity    Peripheral neuropathy    Plantar fasciitis    PMH of   Portal hypertension ([place]) 2017   Prostate cancer ([place])    Right shoulder pain 11/2017   Sleep apnea    no cpap, patient denies   Wears reading eyeglasses     Past Surgical History:  Procedure Laterality Date   CATARACT EXTRACTION, BILATERAL  12/2011    Dr Patrick Kidd   COLONOSCOPY  2017   COLONOSCOPY W/ POLYPECTOMY  07/03/2010   2 adenomas, diverticulosis on right. Dr Patrick Kidd   CYSTOSCOPY N/A 03/15/2018   Procedure: [name];  Surgeon: Patrick Mallow, MD;  [facility];  Service: Urology;  Laterality: N/A;  NO SEEDS FOUND IN BLADDER   ESOPHAGOGASTRODUODENOSCOPY (EGD) WITH PROPOFOL N/A 01/08/2016   Procedure: ESOPHAGOGASTRODUODENOSCOPY (EGD) WITH PROPOFOL;  Surgeon: Patrick Artist, MD;  Location: [facility];  Service: Endoscopy;  Laterality: N/A;   [place]   RADIOACTIVE SEED IMPLANT N/A 03/15/2018   Procedure: RADIOACTIVE SEED IMPLANT/BRACHYTHERAPY IMPLANT;  Surgeon: Patrick Mallow, MD;  Location: [place];  Service: Urology;  Laterality: N/A;   72 SEEDS IMPLANTED   SIGMOIDOSCOPY     SPACE OAR INSTILLATION N/A 03/15/2018   Procedure: SPACE OAR INSTILLATION;  Surgeon: Patrick Mallow, MD;  Location: [facility];  Service: Urology;  Laterality: N/A;   WISDOM TOOTH EXTRACTION      Prior to Admission medications   Medication Sig Start Date End Date Taking? Authorizing Provider  cyclobenzaprine (FLEXERIL) 5 MG tablet Take 1 to 2 tabs at bedtime if needed. Patient taking differently: Take 5 mg by mouth 3 (three) times daily as needed for muscle spasms.  01/10/19  Yes Patrick Lima, MD  gabapentin (NEURONTIN) 300 MG capsule Take 1 capsule (300 mg total) by mouth 2 (two) times daily. Patient taking differently: Take  300 mg by mouth daily.  08/24/18  Yes Kidd, Patrick R, DO  ibuprofen (ADVIL) 200 MG tablet Take 400 mg by mouth every 6 (six) hours as needed for moderate pain.   Yes [provider]  metFORMIN (GLUCOPHAGE-XR) 500 MG 24 hr tablet TAKE 1 TABLET(500 MG) BY MOUTH DAILY WITH BREAKFAST Patient taking differently: Take 500 mg by mouth daily with breakfast.  04/20/18  Yes Patrick Lima, MD  pantoprazole (PROTONIX) 40 MG tablet TAKE 1 TABLET(40 MG) BY MOUTH TWICE DAILY Patient taking differently: Take 40 mg by mouth 2 (two) times daily.  03/17/18  Yes Gatha Mayer, MD  traMADol (ULTRAM) 50 MG tablet Take 1 tablet (50 mg total) by  mouth every 8 (eight) hours as needed for up to 5 days. Patient taking differently: Take 50 mg by mouth every 8 (eight) hours as needed for moderate pain.  01/25/19 01/30/19 Yes Marrian Salvage, FNP  blood glucose meter kit and supplies KIT Use to test blood sugar once daily. DX E11.8 12/21/17   Patrick Lima, MD  Blood Glucose Monitoring Suppl (FREESTYLE FREEDOM LITE) w/Device KIT Use to check blood sugars 2 times daily 01/14/16   [name], FNP  glucose blood (COOL BLOOD GLUCOSE TEST STRIPS) test strip Use to check blood sugar daily. DX: E11.8 12/21/17   Patrick Lima, MD  Lancets (ACCU-CHEK MULTICLIX) lancets Use to check sugar daily. DX: E11.8 12/21/17   Patrick Lima, MD  mirabegron ER (MYRBETRIQ) 25 MG TB24 tablet Take 1 tablet (25 mg total) by mouth daily. Patient not taking: Reported on 01/28/2019 01/10/19   Patrick Lima, MD    Scheduled Meds:  folic acid  1 mg Oral Daily   insulin aspart  0-9 Units Subcutaneous Q4H   LORazepam  0-4 mg Intravenous Q6H   Followed by   Derrill Memo ON 01/31/2019] LORazepam  0-4 mg Intravenous Q12H   multivitamin with minerals  1 tablet Oral Daily   pantoprazole  40 mg Oral BID   sodium chloride flush  3 mL Intravenous Q12H   thiamine  100 mg Oral Daily   Or   thiamine  100 mg Intravenous Daily    Infusions:  sodium chloride     sodium chloride 75 mL/hr at 01/29/19 0810   piperacillin-tazobactam (ZOSYN)  IV Stopped (01/29/19 0852)   PRN Meds: sodium chloride, HYDROmorphone (DILAUDID) injection, LORazepam **OR** LORazepam, sodium chloride flush, traMADol   Allergies as of 01/28/2019   (No Known Allergies)    Family History  Problem Relation Age of Onset   Lung cancer Mother        smoker   Leukemia Father        Acute myelocytic   Diabetes Neg Hx    Stroke Neg Hx    Heart disease Neg Hx    Colon cancer Neg Hx    Colon polyps Neg Hx    Esophageal cancer Neg Hx    Rectal cancer Neg Hx    Stomach cancer Neg Hx     Social History   Socioeconomic History   Marital status: Married    Spouse name: [name]   Number of children: 2   Years of education: Not on file   Highest education level: Some college, no degree  Occupational History   Occupation: Adult nurse estate  Tobacco Use   Smoking status: Former Smoker    Packs/day: 2.00    Years: 6.00    Pack years: 12.00    Types: Cigarettes    Quit date: 02/04/1971    Years since quitting: 48.0   Smokeless tobacco: Never Used   Tobacco comment: smoked age [age], up to 2 ppd  Substance and Sexual Activity   Alcohol use: Yes    Alcohol/week: 28.0 standard drinks    Types: 28 Shots of liquor per week    Comment: 2-3 vodka martinins a day   Drug use: No   Sexual activity: Yes    Partners: Female  Other Topics Concern   Not on file  Social History Narrative   Fun/Hobby: Play golf, grandchildren, [employer]      Patient is left-handed. He lives with his wife in a one level home. He swims  most days.   Social Determinants of Health   Financial Resource Strain:    Difficulty of Paying Living Expenses: Not on file  Food Insecurity:    Worried About Charity fundraiser in the Last Year: Not on file   [employer] of Food in the Last Year: Not on file  Transportation Needs:    Lack of  Transportation (Medical): Not on file   Lack of Transportation (Non-Medical): Not on file  Physical Activity:    Days of Exercise per Week: Not on file   Minutes of Exercise per Session: Not on file  Stress:    Feeling of Stress : Not on file  Social Connections:    Frequency of Communication with Friends and Family: Not on file   Frequency of Social Gatherings with Friends and Family: Not on file   Attends Religious Services: Not on file   Active Member of Clubs or Organizations: Not on file   Attends Archivist Meetings: Not on file   Marital Status: Not on file  Intimate Partner Violence:    Fear of Current or Ex-Partner: Not on file   Emotionally Abused: Not on file   Physically Abused: Not on file   Sexually Abused: Not on file    REVIEW OF SYSTEMS: Constitutional: Weakness.  Fatigue. ENT:  No nose bleeds Pulm: No difficulty breathing or cough. CV:  No palpitations, no angina. GU: Urinary incontinence GI: See HPI. Heme: No unusual bleeding but bruises easily. Transfusions: None Neuro: Arthritis in his neck which causes headaches.  Derm:  No itching, no rash or sores.  Purpura on his hands arms. Endocrine: No excessive thirst or urination.  No sweats or chills. Immunization: Reviewed.  I do not see a flu shot for this current year. Travel:  None beyond local counties in last few months.    PHYSICAL EXAM: Vital signs in last 24 hours: Vitals:   01/29/19 0930 01/29/19 1000  BP: (!) 145/68 139/68  Pulse: (!) 105 (!) 108  Resp: (!) 21 (!) 27  Temp:    SpO2: 97% 96%   Wt Readings from Last 3 Encounters:  01/28/19 106.6 kg  11/22/18 112.5 kg  08/24/18 108.9 kg    General: Ill appearing, mildly jaundiced and icteric WM who is having trouble answering questions. Head: No facial asymmetry or swelling.  No signs of head trauma. Eyes: Sclera mildly icteric.  Conjunctiva pink. Ears: Not hard of hearing Nose: No congestion or discharge Mouth:  Moist, clear, pink oropharynx.  Tongue midline.  Fair dentition. Neck: No JVD, no masses, no thyromegaly. Lungs: No cough, no labored breathing.  Lungs clear bilaterally though breath sounds diminished due to poor inspiratory effort. Heart: RRR.  No MRG.   Abdomen: Active bowel sounds.  No HSM.  No tenderness.  Distended but soft with ascites. Rectal: Deferred GU: Depends undergarment was saturated with deeply pigmented urine.  Scrotal edema. Musc/Skeltl: No joint deformities or swelling. Neurologic: Patient unable to tell me the date, the year.  Says [facility] is at [facility].  Moves all 4 limbs with no tremors.  Confused and mildly somnolent, drifting attention.. Extremities:  Slight nonpitting edema in the tops of the feet/ankles  Skin: Slight jaundice.  Purpura/bruising on left wrist area Tattoos: None observed Nodes: No cervical adenopathy Psych: Calm, cooperative.  Speech is not fluid, laconic.  Intake/Output from previous day: 12/25 0701 - 12/26 0700 In: 100 [IV Piggyback:100] Out: -  Intake/Output this shift: Total I/O In: 235.8 [I.V.:135.8;  IV Piggyback:100] Out: -   LAB RESULTS: Recent Labs    01/28/19 1833 01/29/19 0500 01/29/19 0626  WBC 15.4* 15.8* 12.5*  HGB 12.8* 13.7 13.4  HCT 36.4* 38.9* 38.9*  PLT 97* 151 133*   BMET Lab Results  Component Value Date   NA 128 (L) 01/29/2019   NA 124 (L) 01/28/2019   NA 138 11/22/2018   K 3.8 01/29/2019   K 4.7 01/28/2019   K 4.2 11/22/2018   CL 88 (L) 01/29/2019   CL 86 (L) 01/28/2019   CL 99 11/22/2018   CO2 26 01/29/2019   CO2 24 01/28/2019   CO2 29 11/22/2018   GLUCOSE 300 (H) 01/29/2019   GLUCOSE 514 (HH) 01/28/2019   GLUCOSE 233 (H) 11/22/2018   BUN 28 (H) 01/29/2019   BUN 29 (H) 01/28/2019   BUN 13 11/22/2018   CREATININE 1.04 01/29/2019   CREATININE 1.15 01/28/2019   CREATININE 0.92 11/22/2018   CALCIUM 8.2 (L) 01/29/2019   CALCIUM 8.3 (L) 01/28/2019   CALCIUM 9.1 11/22/2018   LFT Recent Labs     01/28/19 1833 01/29/19 0626  PROT 6.0* 6.1*  ALBUMIN 2.1* 2.1*  AST 46* 51*  ALT 34 37  ALKPHOS 316* 301*  BILITOT 15.7* 15.6*  BILIDIR 11.2*  --   IBILI 4.5*  --    PT/INR Lab Results  Component Value Date   INR 1.3 (H) 01/29/2019   INR 1.3 (H) 01/28/2019   INR 1.1 (H) 07/06/2018   Hepatitis Panel No results for input(s): HEPBSAG, HCVAB, HEPAIGM, HEPBIGM in the last 72 hours. C-Diff No components found for: CDIFF Lipase     Component Value Date/Time   LIPASE 40.0 09/06/2015 1201    Drugs of Abuse  No results found for: LABOPIA, COCAINSCRNUR, LABBENZ, AMPHETMU, THCU, LABBARB   RADIOLOGY STUDIES: CT Abdomen Pelvis W Contrast  Result Date: 01/28/2019 CLINICAL DATA:  Fall right flank hematoma EXAM: CT ABDOMEN AND PELVIS WITH CONTRAST TECHNIQUE: Multidetector CT imaging of the abdomen and pelvis was performed using the standard protocol following bolus administration of intravenous contrast. CONTRAST:  189m OMNIPAQUE IOHEXOL 300 MG/ML  SOLN COMPARISON:  July 27, 2018 FINDINGS: Lower chest: The visualized heart size within normal limits. No pericardial fluid/thickening. No hiatal hernia. Streaky atelectasis seen at both lung bases. Hepatobiliary: Again noted is hepatomegaly with a slightly nodular liver contour, consistent with cirrhosis. There is a low-density lesion in the posterior right liver lobe measuring 6.2 cm, unchanged from the prior CT. The gallbladder appears to be fluid-filled and mildly distended with a small amount of pericholecystic fluid and fat stranding changes. No definite layering gallstones however are present. No biliary ductal dilatation. Pancreas: Unremarkable. No pancreatic ductal dilatation or surrounding inflammatory changes. Spleen: Normal in size without focal abnormality. Adrenals/Urinary Tract: Both adrenal glands appear normal. The kidneys and collecting system appear normal without evidence of urinary tract calculus or hydronephrosis. Bladder is  unremarkable. Stomach/Bowel: The stomach, small bowel, and colon are normal in appearance. No inflammatory changes, wall thickening, or obstructive findings.Scattered colonic diverticula are noted without diverticulitis. The appendix is unremarkable. Vascular/Lymphatic: There are no enlarged mesenteric, retroperitoneal, or pelvic lymph nodes. A small amount of perihepatic ascites is noted. Scattered aortic atherosclerotic calcifications are seen without aneurysmal dilatation. Reproductive: The prostate contains radiation prostate seeds. Other: No evidence of abdominal wall mass or hernia. Musculoskeletal: No acute or significant osseous findings. IMPRESSION: Mildly distended gallbladder with a small amount of pericholecystic fluid and surrounding inflammatory changes. This could be due  to cholecystitis. If further evaluation is required would recommend right upper quadrant ultrasound Hepatomegaly and findings suggestive of cirrhosis. Small perihepatic ascites Aortic Atherosclerosis (ICD10-I70.0). Unchanged 6 cm hepatic cyst. Electronically Signed   By: Prudencio Pair M.D.   On: 01/28/2019 23:23   DG Chest Port 1 View  Result Date: 01/28/2019 CLINICAL DATA:  Shortness of breath, diagnosed with COVID-19 on 01/03/2019, increased fatigue, jaundice, cirrhosis EXAM: PORTABLE CHEST 1 VIEW COMPARISON:  Portable exam 1759 hours compared to 02/19/2018 FINDINGS: Normal heart size, mediastinal contours, and pulmonary vascularity. Atherosclerotic calcification aorta. Mild LEFT basilar atelectasis. Lungs otherwise clear. No pulmonary infiltrate, pleural effusion or pneumothorax. Multilevel endplate spur formation thoracic spine. IMPRESSION: Mild LEFT basilar atelectasis. Electronically Signed   By: Lavonia Dana M.D.   On: 01/28/2019 18:17   US Abdomen Limited RUQ  Result Date: 01/29/2019 CLINICAL DATA:  Jaundice EXAM: ULTRASOUND ABDOMEN LIMITED RIGHT UPPER QUADRANT COMPARISON:  None. FINDINGS: Gallbladder: Layering  sludge/stones seen within gallbladder there is a edematous mildly prominent gallbladder wall measuring 3.1 mm. No sonographic Murphy sign. Common bile duct: Diameter: 3.7 mm Liver: Increased echotexture seen throughout. There is an anechoic cyst within the right liver lobe measuring 7.3 x 5.7 x 5.1 cm. The main portal vein appears patent. Other: None. IMPRESSION: Layering sludge/stones with mild gallbladder wall thickening. This could be due to mild acute cholecystitis. Electronically Signed   By: Prudencio Pair M.D.   On: 01/29/2019 01:55     IMPRESSION:   *   Acute cholecystitis?  Based on Child's class C surgery feels he is not a candidate for surgical intervention.  Surgery also not sure if this is cholecystitis.  HIDA scan unlikely to be helpful given the level of jaundice.  Surgery also wondering if he is a candidate for cholecystostomy tube. Agree that what we are seeing may just be decompensated cirrhosis, not cholecystitis but decompensated liver disease does not explain the recent right upper quadrant abdominal pain.    *    Cirrhosis of liver.  Ongoing heavy alcohol abuse. Child's score of 10, Class C INR slightly elevated at 1.3   *   Thrombocytopenia.  Improved c/w 2 months ago.   *   Uncontrolled DM 2.     *   Covid 19 + 01/03/19.  Treated w course of Prednisone at home.   Per Dr Hatcher/ID: "don't retest; don't isolate; treat as immune".    PLAN:     *  No indication for ERCP given normal diameter CBD. HIDA will not be useful given current t bili of 12 Defer decision re cholecystostomy tube to surgery but do not see that cirrhosis/Child's C is contraindication for tube.    *    Oral vitamin K for the next 3 days.  Daily INR and CMET Lactulose 20 gm po bid.  Check ammonia level now.    *   CIWA protocol.    *   Lipase ordered.  No indication of pancreatitis based on CT scan.  *     Carbohydrate modified diet.  Azucena Freed  01/29/2019, 10:34 AM Phone [phone]  [phone]    Attending physician's note   I have taken an interval history, reviewed the chart and examined the patient. I agree with the Advanced Practitioner's note, impression and recommendations. Have reviewed CT Abdo/pelvis.  Marked cholestatic jaundice with TB 15.8, PT INR 1.3, Alb 2.1  Decompensated liver cirrhosis with portal HTN and liver failure.  Acute decompensation d/t recent COVID-19/steroids/uncontrolled DM/ETOH.  Doubt cholecystitis.  Not having any significant pain/tenderness RUQ/Murphy's. Has some tender hepatomegaly- left lobe of the liver.  No choledocholithiasis (CBD Nl on US/CT).  I believe gallbladder wall thickening is d/t perihepatic ascites.  Has been started on Zosyn.    No varices on EGD 10/2015.  Not a candidate for liver transplant d/t ETOH/age/comorbid conditions.  Hepatic encephalopathy.  COVID-19 01/03/19. Treated with prednisone.  Per ID- prednisone at home. "don't retest; don't isolate; treat as immune".  Large right hepatic benign-appearing cyst.  Plan: -Supportive treatment. -Trend CBC, LFTs, PT/INR. -We will follow along.  Hold off on cholecystostomy tube at this time. -May need MRCP in the next few days depending upon clinical course/labs. -Watch for alcohol withdrawal. -Would also hold off on steroids for now.  Unclear etiology/did have recent steroids/uncontrolled DM. -Agree with lactulose, vitamin K. -Salt restricted, normal protein diet. -Will discuss with Dr. Carlean Kidd.   Carmell Austria, MD Velora Heckler Fabienne Bruns [phone].

## 2019-01-29 NOTE — Progress Notes (Signed)
[facility]                                                                              Patient Demographics  Patrick Kidd, is a 73 y.o. male, DOB - 16-Oct-1945, PQD:[id number]  Admit date - 01/28/2019   Admitting Physician Jani Gravel, MD  Outpatient Primary MD for the patient is [name], MD  Outpatient specialists:   LOS - 0  days   Medical records reviewed and are as summarized below:    Chief Complaint  Patient presents with  . Fatigue       Brief summary   Per Dr. Jani Gravel, admission H&P Patient is a 73 year old male with history of diabetes mellitus type 2, alcoholic cirrhosis, portal hypertension, PUD, BPH, recent Covid infection, treated with steroids presented with right-sided and epigastric abdominal pain.  Patient also reported generalized weakness, more fatigued and yellowish skin. Has been drinking vodka at least 8 ounces, at least 16/day.  Denies any nausea vomiting diarrhea hematochezia or melena. COVID-19 test was positive on 01/03/19 Labs showed transaminitis with obstructive pattern, alk phos 316, total bilirubin 11.2, indirect 4.5. Procalcitonin 3.89, CRP 27.2.  WBCs 15.8, hemoglobin 13.7 CT abdomen showed mildly distended gallbladder with surrounding inflammatory changes, small amount of pericholecystic fluid, due to cholecystitis, findings of cirrhosis.  Confirmed by right upper quadrant ultrasound. Patient was admitted for further work-up  Assessment & Plan    Principal Problem: Abdominal pain secondary to acute cholecystitis, transaminitis with obstructive pattern, hyperbilirubinemia -CT abdomen showed findings suggestive of acute cholecystitis -Right upper quadrant ultrasound showed layering sludge/gallstones with mild gallbladder wall thickening could be due to mild acute cholecystitis -Continue n.p.o. status, pain control, IV fluids, IV Zosyn -Surgery discussed with Dr. Lucia Gaskins, recommended GI consult, will  evaluate. -GI consulted, may need ERCP   Active Problems: History of alcoholic cirrhosis - Patient strongly recommended to quit alcohol. -GI consulted, currently alert and oriented, no encephalopathy.  - CT abdomen showed hepatomegaly, cirrhosis, small perihepatic ascites.  GI following  History of alcohol use -Continue CIWA with Ativan, IV fluids  -Continue thiamine, folate, MVI    GERD -Continue PPI    Type 2 diabetes mellitus, NIDDM, uncontrolled with hyperglycemia -Currently n.p.o., placed on sliding scale insulin every 4 hours -Hold Metformin, not on insulin at home    Essential hypertension -BP currently elevated, likely also due to pain -Not on any antihypertensives outpatient, placed on IV hydralazine as needed with parameters    COVID-19 virus infection -Diagnosed on 01/03/2019, was treated with steroids -Currently no hypoxia, CRP elevated however likely due to #1, procalcitonin 3.89 -D-dimer elevated > 3.70, fibrinogen> 800, ferritin 996 -Follow inflammatory markers.  If patient develops any respiratory symptoms or hypoxia, will obtain CTA chest.  Hyponatremia -Likely due to dehydration, poor p.o. intake and pseudohyponatremia due to hyperglycemia -For now continue IV fluid hydration, improving.  Sodium 124 at the time of admission, currently 128 -Corrected sodium today 131  Obesity Estimated body mass index is 30.17 kg/m as calculated from the following:   Height as of this encounter: 6' 2"  (1.88 m).   Weight as of this encounter: 106.6  kg.  Code Status: Full CODE STATUS DVT Prophylaxis: SCDs Family Communication: Discussed all imaging results, lab results, explained to the patient   Disposition Plan: *Remains inpatient, high risk of deterioration.  Presenting with acute cholecystitis, direct hyperbilirubinemia, will need further work-up with gastroenterology, surgery  Time Spent in minutes 35 minutes  Procedures:  None  Consultants:   General surgery,  Dr. Lucia Gaskins [facility]  Antimicrobials:   Anti-infectives (From admission, onward)   Start     Dose/Rate Route Frequency Ordered Stop   01/29/19 0700  piperacillin-tazobactam (ZOSYN) IVPB 3.375 g     3.375 g 12.5 mL/hr over 240 Minutes Intravenous Every 8 hours 01/29/19 0403     01/29/19 0030  piperacillin-tazobactam (ZOSYN) IVPB 3.375 g     3.375 g 100 mL/hr over 30 Minutes Intravenous  Once 01/29/19 0019 01/29/19 0123         Medications  Scheduled Meds: . folic acid  1 mg Oral Daily  . insulin aspart  0-9 Units Subcutaneous Q4H  . LORazepam  0-4 mg Intravenous Q6H   Followed by  . [START ON 01/31/2019] LORazepam  0-4 mg Intravenous Q12H  . multivitamin with minerals  1 tablet Oral Daily  . pantoprazole  40 mg Oral BID  . sodium chloride flush  3 mL Intravenous Q12H  . thiamine  100 mg Oral Daily   Or  . thiamine  100 mg Intravenous Daily   Continuous Infusions: . sodium chloride    . sodium chloride 75 mL/hr at 01/29/19 0810  . piperacillin-tazobactam (ZOSYN)  IV Stopped (01/29/19 0852)   PRN Meds:.sodium chloride, HYDROmorphone (DILAUDID) injection, LORazepam **OR** LORazepam, sodium chloride flush, traMADol      Subjective:   Patrick Kidd was seen and examined today.  Complaining of pain in the epigastric and right upper quadrant region, 8/10, sharp and constant.  No nausea or vomiting. Patient denies dizziness, chest pain, shortness of breath, new weakness, numbess, tingling.   Objective:   Vitals:   01/29/19 0830 01/29/19 0900 01/29/19 0930 01/29/19 1000  BP: 121/74 (!) 123/57 (!) 145/68 139/68  Pulse: 98 (!) 102 (!) 105 (!) 108  Resp: (!) 21 20 (!) 21 (!) 27  Temp:      TempSrc:      SpO2: 98% 100% 97% 96%  Weight:      Height:        Intake/Output Summary (Last 24 hours) at 01/29/2019 1045 Last data filed at 01/29/2019 0017 Gross per 24 hour  Intake 335.83 ml  Output --  Net 335.83 ml     Wt Readings from Last 3 Encounters:   01/28/19 106.6 kg  11/22/18 112.5 kg  08/24/18 108.9 kg     Exam  General: Alert and oriented x 3, NAD, jaundiced, and discomfort  Eyes: Scleral icterus  HEENT:  Atraumatic, normocephalic  Cardiovascular: S1 S2 auscultated, no murmurs, RRR  Respiratory: Clear to auscultation bilaterally, no wheezing, rales or rhonchi  Gastrointestinal: RUQ TTP,  nondistended, + bowel sounds  Ext: no pedal edema bilaterally  Neuro: No new deficits  Musculoskeletal: No digital cyanosis, clubbing  Skin: No rashes  Psych:  alert and oriented x3    Data Reviewed:  I have personally reviewed following labs and imaging studies  Micro Results No results found for this or any previous visit (from the past 240 hour(s)).  Radiology Reports CT Abdomen Pelvis W Contrast  Result Date: 01/28/2019 CLINICAL DATA:  Fall right flank hematoma EXAM: CT ABDOMEN AND PELVIS WITH CONTRAST TECHNIQUE:  Multidetector CT imaging of the abdomen and pelvis was performed using the standard protocol following bolus administration of intravenous contrast. CONTRAST:  175m OMNIPAQUE IOHEXOL 300 MG/ML  SOLN COMPARISON:  July 27, 2018 FINDINGS: Lower chest: The visualized heart size within normal limits. No pericardial fluid/thickening. No hiatal hernia. Streaky atelectasis seen at both lung bases. Hepatobiliary: Again noted is hepatomegaly with a slightly nodular liver contour, consistent with cirrhosis. There is a low-density lesion in the posterior right liver lobe measuring 6.2 cm, unchanged from the prior CT. The gallbladder appears to be fluid-filled and mildly distended with a small amount of pericholecystic fluid and fat stranding changes. No definite layering gallstones however are present. No biliary ductal dilatation. Pancreas: Unremarkable. No pancreatic ductal dilatation or surrounding inflammatory changes. Spleen: Normal in size without focal abnormality. Adrenals/Urinary Tract: Both adrenal glands appear normal.  The kidneys and collecting system appear normal without evidence of urinary tract calculus or hydronephrosis. Bladder is unremarkable. Stomach/Bowel: The stomach, small bowel, and colon are normal in appearance. No inflammatory changes, wall thickening, or obstructive findings.Scattered colonic diverticula are noted without diverticulitis. The appendix is unremarkable. Vascular/Lymphatic: There are no enlarged mesenteric, retroperitoneal, or pelvic lymph nodes. A small amount of perihepatic ascites is noted. Scattered aortic atherosclerotic calcifications are seen without aneurysmal dilatation. Reproductive: The prostate contains radiation prostate seeds. Other: No evidence of abdominal wall mass or hernia. Musculoskeletal: No acute or significant osseous findings. IMPRESSION: Mildly distended gallbladder with a small amount of pericholecystic fluid and surrounding inflammatory changes. This could be due to cholecystitis. If further evaluation is required would recommend right upper quadrant ultrasound Hepatomegaly and findings suggestive of cirrhosis. Small perihepatic ascites Aortic Atherosclerosis (ICD10-I70.0). Unchanged 6 cm hepatic cyst. Electronically Signed   By: BPrudencio PairM.D.   On: 01/28/2019 23:23   DG Chest Port 1 View  Result Date: 01/28/2019 CLINICAL DATA:  Shortness of breath, diagnosed with COVID-19 on 01/03/2019, increased fatigue, jaundice, cirrhosis EXAM: PORTABLE CHEST 1 VIEW COMPARISON:  Portable exam 1759 hours compared to 02/19/2018 FINDINGS: Normal heart size, mediastinal contours, and pulmonary vascularity. Atherosclerotic calcification aorta. Mild LEFT basilar atelectasis. Lungs otherwise clear. No pulmonary infiltrate, pleural effusion or pneumothorax. Multilevel endplate spur formation thoracic spine. IMPRESSION: Mild LEFT basilar atelectasis. Electronically Signed   By: MLavonia DanaM.D.   On: 01/28/2019 18:17   UKoreaAbdomen Limited RUQ  Result Date: 01/29/2019 CLINICAL DATA:   Jaundice EXAM: ULTRASOUND ABDOMEN LIMITED RIGHT UPPER QUADRANT COMPARISON:  None. FINDINGS: Gallbladder: Layering sludge/stones seen within gallbladder there is a edematous mildly prominent gallbladder wall measuring 3.1 mm. No sonographic Murphy sign. Common bile duct: Diameter: 3.7 mm Liver: Increased echotexture seen throughout. There is an anechoic cyst within the right liver lobe measuring 7.3 x 5.7 x 5.1 cm. The main portal vein appears patent. Other: None. IMPRESSION: Layering sludge/stones with mild gallbladder wall thickening. This could be due to mild acute cholecystitis. Electronically Signed   By: BPrudencio PairM.D.   On: 01/29/2019 01:55    Lab Data:  CBC: Recent Labs  Lab 01/28/19 1833 01/29/19 0500 01/29/19 0626  WBC 15.4* 15.8* 12.5*  NEUTROABS 12.2*  --   --   HGB 12.8* 13.7 13.4  HCT 36.4* 38.9* 38.9*  MCV 96.0 95.8 97.3  PLT 97* 151 1193   Basic Metabolic Panel: Recent Labs  Lab 01/28/19 1833 01/29/19 0626  NA 124* 128*  K 4.7 3.8  CL 86* 88*  CO2 24 26  GLUCOSE 514* 300*  BUN 29* 28*  CREATININE 1.15 1.04  CALCIUM 8.3* 8.2*  MG 1.5*  --    GFR: Estimated Creatinine Clearance: 81.1 mL/min (by C-G formula based on SCr of 1.04 mg/dL). Liver Function Tests: Recent Labs  Lab 01/28/19 1833 01/29/19 0626  AST 46* 51*  ALT 34 37  ALKPHOS 316* 301*  BILITOT 15.7* 15.6*  PROT 6.0* 6.1*  ALBUMIN 2.1* 2.1*   No results for input(s): LIPASE, AMYLASE in the last 168 hours. No results for input(s): AMMONIA in the last 168 hours. Coagulation Profile: Recent Labs  Lab 01/28/19 1833 01/29/19 0500  INR 1.3* 1.3*   Cardiac Enzymes: No results for input(s): CKTOTAL, CKMB, CKMBINDEX, TROPONINI in the last 168 hours. BNP (last 3 results) No results for input(s): PROBNP in the last 8760 hours. HbA1C: No results for input(s): HGBA1C in the last 72 hours. CBG: Recent Labs  Lab 01/28/19 2145 01/29/19 0437 01/29/19 0752  GLUCAP 431* 335* 278*   Lipid  Profile: No results for input(s): CHOL, HDL, LDLCALC, TRIG, CHOLHDL, LDLDIRECT in the last 72 hours. Thyroid Function Tests: Recent Labs    01/29/19 0500  TSH 1.830   Anemia Panel: Recent Labs    01/29/19 0634  FERRITIN 996*   Urine analysis:    Component Value Date/Time   COLORURINE AMBER (A) 01/29/2019 0047   APPEARANCEUR HAZY (A) 01/29/2019 0047   LABSPEC 1.026 01/29/2019 0047   PHURINE 5.0 01/29/2019 0047   GLUCOSEU >=500 (A) 01/29/2019 0047   GLUCOSEU NEGATIVE 11/15/2014 1516   HGBUR NEGATIVE 01/29/2019 0047   BILIRUBINUR MODERATE (A) 01/29/2019 0047   BILIRUBINUR Positive 11/11/2017 1037   KETONESUR NEGATIVE 01/29/2019 0047   PROTEINUR NEGATIVE 01/29/2019 0047   UROBILINOGEN 1.0 11/11/2017 1037   UROBILINOGEN 2.0 (A) 11/15/2014 1516   NITRITE NEGATIVE 01/29/2019 0047   LEUKOCYTESUR NEGATIVE 01/29/2019 0047     Jaileen Janelle M.D. [facility] 01/29/2019, 10:45 AM   Call night coverage person covering after 7pm

## 2019-01-29 NOTE — ED Notes (Signed)
Patient is beginning to show signs of slight confusion or forgetfulness.

## 2019-01-29 NOTE — ED Notes (Signed)
Patient pulled out IV because he said it was hurting. Patient then stated he forgot it was there. Patient has been cleaned and linens changed.  Cardiac Echo is at bedside at this time.

## 2019-01-29 NOTE — Progress Notes (Signed)
Echo with contrast completed 

## 2019-01-29 NOTE — H&P (Signed)
TRH H&P    Patient Demographics:    Patrick Kidd, is a 73 y.o. male  MRN: [id number]  DOB - 13-Mar-1945  Admit Date - 01/28/2019  Referring MD/NP/PA: Quintella Reichert  Outpatient Primary MD for the patient is [name], MD  Patient coming from: home  Chief complaint- jaundice   HPI:    Patrick Kidd  is a 73 y.o. male,  w Dm2, alcoholic cirrhosis , portal hypertension, PUD, BPH, recent covid-19 positive (01/03/19), tx w prednisone presents with c/o right sided pain ,   And noted more fatigue and yellow skin.  Pt notes that he is drinking vodka, at least 8 ounces, probably more like 16 per day at least.  Pt  Denies fever, chills, n/v, diarrhea, brbpr.   In ED,  T 98.1, P 106, R 18, Bp 131/82  Pox 97% on RA Wt 106.6kg  CXR IMPRESSION: Mild LEFT basilar atelectasis.  CT scan abd/ pelvis IMPRESSION: Mildly distended gallbladder with a small amount of pericholecystic fluid and surrounding inflammatory changes. This could be due to cholecystitis. If further evaluation is required would recommend right upper quadrant ultrasound  Hepatomegaly and findings suggestive of cirrhosis.  Small perihepatic ascites  Wbc 15.4, Hgb 12.8, Plt 97 Na 124, K 4.7,  Bun 29, Creatinine 1.15 Magnesium 1.5 INR 1.3 Urinalysis  Bilirubin moderate  Pt given magnesium 2gm iv x1 in ED  Pt will be admitted for jaundice, abnormal liver function.       Review of systems:    In addition to the HPI above,  No Fever-chills, No Headache, No changes with Vision or hearing, No problems swallowing food or Liquids, No Chest pain, Cough or Shortness of Breath, No Nausea or Vomiting, bowel movements are regular, No Blood in stool or Urine, No dysuria, No new skin rashes or bruises, No new joints pains-aches,  No new weakness, tingling, numbness in any extremity, No recent weight gain or loss, No polyuria,  polydypsia or polyphagia, No significant Mental Stressors.  All other systems reviewed and are negative.    Past History of the following :    Past Medical History:  Diagnosis Date  . Acrophobia   . Alcoholic cirrhosis ([place]) 7/95/3692  . Anemia   . Arthritis    foot by big toe  . BPH associated with nocturia   . Cataract    removed both eyes  . Chronic cough    PMH of  . Diabetes mellitus without complication ([place])   . Diverticulosis 07-03-2010   Colonoscopy.   . Duodenal ulcer 2017  . Fallen arches    Bilateral  . GERD (gastroesophageal reflux disease)   . Gout   . Granuloma annulare   . Hx of adenomatous colonic polyps multiple  . Hydrocele 2011   Large septated right hydrocele  . Liver cyst   . Liver lesion   . Nonspecific elevation of levels of transaminase or lactic acid dehydrogenase (LDH)   . Obesity   . Peripheral neuropathy   . Plantar fasciitis    PMH of  .  Portal hypertension ([place]) 2017  . Prostate cancer ([place])   . Right shoulder pain 11/2017  . Sleep apnea    no cpap, patient denies  . Wears reading eyeglasses       Past Surgical History:  Procedure Laterality Date  . CATARACT EXTRACTION, BILATERAL  12/2011    Dr Gershon Crane  . COLONOSCOPY  2017  . COLONOSCOPY W/ POLYPECTOMY  07/03/2010   2 adenomas, diverticulosis on right. Dr [name]  . CYSTOSCOPY N/A 03/15/2018   Procedure: CYSTOSCOPY FLEXIBLE;  Surgeon: [name], MD;  Location: [facility];  Service: Urology;  Laterality: N/A;  NO SEEDS FOUND IN BLADDER  . ESOPHAGOGASTRODUODENOSCOPY (EGD) WITH PROPOFOL N/A 01/08/2016   Procedure: ESOPHAGOGASTRODUODENOSCOPY (EGD) WITH PROPOFOL;  Surgeon: [name], MD;  Location: [facility];  Service: Endoscopy;  Laterality: N/A;  . FLEXIBLE SIGMOIDOSCOPY  2000  . RADIOACTIVE SEED IMPLANT N/A 03/15/2018   Procedure: RADIOACTIVE SEED IMPLANT/BRACHYTHERAPY IMPLANT;  Surgeon: [name], MD;  Location[facility];   Service: Urology;  Laterality: N/A;   72 SEEDS IMPLANTED  . SIGMOIDOSCOPY    . SPACE OAR INSTILLATION N/A 03/15/2018   Procedure: SPACE OAR INSTILLATION;  Surgeon: [name], MD;  Location: [facility];  Service: Urology;  Laterality: N/A;  . WISDOM TOOTH EXTRACTION        Social History:      Social History   Tobacco Use  . Smoking status: Former Smoker    Packs/day: 2.00    Years: 6.00    Pack years: 12.00    Types: Cigarettes    Quit date: 02/04/1971    Years since quitting: 48.0  . Smokeless tobacco: Never Used  . Tobacco comment: smoked age [age], up to 2 ppd  Substance Use Topics  . Alcohol use: Yes    Alcohol/week: 28.0 standard drinks    Types: 28 Shots of liquor per week    Comment: 2-3 vodka martinins a day       Family History :     Family History  Problem Relation Age of Onset  . Lung cancer Mother        smoker  . Leukemia Father        Acute myelocytic  . Diabetes Neg Hx   . Stroke Neg Hx   . Heart disease Neg Hx   . Colon cancer Neg Hx   . Colon polyps Neg Hx   . Esophageal cancer Neg Hx   . Rectal cancer Neg Hx   . Stomach cancer Neg Hx        Home Medications:   Prior to Admission medications   Medication Sig Start Date End Date Taking? Authorizing Provider  cyclobenzaprine (FLEXERIL) 5 MG tablet Take 1 to 2 tabs at bedtime if needed. Patient taking differently: Take 5 mg by mouth 3 (three) times daily as needed for muscle spasms.  01/10/19  Yes [name], MD  gabapentin (NEURONTIN) 300 MG capsule Take 1 capsule (300 mg total) by mouth 2 (two) times daily. Patient taking differently: Take 300 mg by mouth daily.  08/24/18  Yes Jaffe, Adam R, DO  ibuprofen (ADVIL) 200 MG tablet Take 400 mg by mouth every 6 (six) hours as needed for moderate pain.   Yes [provider]  metFORMIN (GLUCOPHAGE-XR) 500 MG 24 hr tablet TAKE 1 TABLET(500 MG) BY MOUTH DAILY WITH BREAKFAST Patient taking differently: Take 500 mg  by mouth daily with breakfast.  04/20/18  Yes [name], MD  pantoprazole (PROTONIX) 40 MG tablet TAKE 1 TABLET(40 MG) BY MOUTH TWICE DAILY Patient taking differently: Take 40 mg by mouth 2 (two) times daily.  03/17/18  Yes Gatha Mayer, MD  traMADol (ULTRAM) 50 MG tablet Take 1 tablet (50 mg total) by mouth every 8 (eight) hours as needed for up to 5 days. Patient taking differently: Take 50 mg by mouth every 8 (eight) hours as needed for moderate pain.  01/25/19 01/30/19 Yes Marrian Salvage, FNP  blood glucose meter kit and supplies KIT Use to test blood sugar once daily. DX E11.8 12/21/17   [name], MD  Blood Glucose Monitoring Suppl (FREESTYLE FREEDOM LITE) w/Device KIT Use to check blood sugars 2 times daily 01/14/16   [name], FNP  glucose blood (COOL BLOOD GLUCOSE TEST STRIPS) test strip Use to check blood sugar daily. DX: E11.8 12/21/17   [name], MD  Lancets (ACCU-CHEK MULTICLIX) lancets Use to check sugar daily. DX: E11.8 12/21/17   [name], MD  mirabegron ER (MYRBETRIQ) 25 MG TB24 tablet Take 1 tablet (25 mg total) by mouth daily. Patient not taking: Reported on 01/28/2019 01/10/19   [name], MD     Allergies:    No Known Allergies   Physical Exam:   Vitals  Blood pressure 129/68, pulse 100, temperature 98.1 F (36.7 C), temperature source Oral, resp. rate (!) 22, height '6\' 2"'$  (1.88 m), weight 106.6 kg, SpO2 97 %.  1.  General: axoxo3  2. Psychiatric: euthymic  3. Neurologic: nonfocal  4. HEENMT:  Icteric, pupils 1.17m symmetric, direct, consensual intact Neck: no jvd  5. Respiratory : CTAB  6. Cardiovascular : rrr s1, s2, no mg//r  7. Gastrointestinal:  Abd: soft, nt, nd, +bs, negative murphy sign  8. Skin:  Ext: no c/c/e,  Slight bruise over right side  9.Musculoskeletal:  Good ROM    Data Review:    CBC Recent Labs  Lab 01/28/19 1833  WBC 15.4*  HGB 12.8*  HCT 36.4*  PLT 97*  MCV 96.0    MCH 33.8  MCHC 35.2  RDW 12.9  LYMPHSABS 1.5  MONOABS 1.3*  EOSABS 0.0  BASOSABS 0.1   ------------------------------------------------------------------------------------------------------------------  Results for orders placed or performed during the hospital encounter of 01/28/19 (from the past 48 hour(s))  CBC with Differential     Status: Abnormal   Collection Time: 01/28/19  6:33 PM  Result Value Ref Range   WBC 15.4 (H) 4.0 - 10.5 K/uL   RBC 3.79 (L) 4.22 - 5.81 MIL/uL   Hemoglobin 12.8 (L) 13.0 - 17.0 g/dL   HCT 36.4 (L) 39.0 - 52.0 %   MCV 96.0 80.0 - 100.0 fL   MCH 33.8 26.0 - 34.0 pg   MCHC 35.2 30.0 - 36.0 g/dL   RDW 12.9 11.5 - 15.5 %   Platelets 97 (L) 150 - 400 K/uL    Comment: Immature Platelet Fraction may be clinically indicated, consider ordering this additional test [id number]   nRBC 0.0 0.0 - 0.2 %   Neutrophils Relative % 79 %   Neutro Abs 12.2 (H) 1.7 - 7.7 K/uL   Lymphocytes Relative 10 %   Lymphs Abs 1.5 0.7 - 4.0 K/uL   Monocytes Relative 8 %   Monocytes Absolute 1.3 (H) 0.1 - 1.0 K/uL   Eosinophils Relative 0 %   Eosinophils Absolute 0.0 0.0 - 0.5 K/uL   Basophils Relative 1 %  Basophils Absolute 0.1 0.0 - 0.1 K/uL   Immature Granulocytes 2 %   Abs Immature Granulocytes 0.36 (H) 0.00 - 0.07 K/uL    Comment: Performed at [facility], [place] [address] [place], [place] [address]  Protime-INR     Status: Abnormal   Collection Time: 01/28/19  6:33 PM  Result Value Ref Range   Prothrombin Time 16.2 (H) 11.4 - 15.2 seconds   INR 1.3 (H) 0.8 - 1.2    Comment: (NOTE) INR goal varies based on device and disease states. Performed at [facility], [place] [address], [place], [place] [address]   Magnesium     Status: Abnormal   Collection Time: 01/28/19  6:33 PM  Result Value Ref Range   Magnesium 1.5 (L) 1.7 - 2.4 mg/dL    Comment: Performed at [facility], [place] [address] [place],  [place] [address]  Basic metabolic panel     Status: Abnormal   Collection Time: 01/28/19  6:33 PM  Result Value Ref Range   Sodium 124 (L) 135 - 145 mmol/L   Potassium 4.7 3.5 - 5.1 mmol/L   Chloride 86 (L) 98 - 111 mmol/L   CO2 24 22 - 32 mmol/L   Glucose, Bld 514 (HH) 70 - 99 mg/dL    Comment: CRITICAL RESULT CALLED TO, READ BACK BY AND VERIFIED WITH: J CURETON,RN 01/28/19 1951 RHOLMES    BUN 29 (H) 8 - 23 mg/dL   Creatinine, Ser 1.15 0.61 - 1.24 mg/dL   Calcium 8.3 (L) 8.9 - 10.3 mg/dL   GFR calc non Af Amer >60 >60 mL/min   GFR calc Af Amer >60 >60 mL/min   Anion gap 14 5 - 15    Comment: Performed at [place] [facility], [place] [address] [place], [place] [address]  Hepatic function panel     Status: Abnormal   Collection Time: 01/28/19  6:33 PM  Result Value Ref Range   Total Protein 6.0 (L) 6.5 - 8.1 g/dL   Albumin 2.1 (L) 3.5 - 5.0 g/dL   AST 46 (H) 15 - 41 U/L   ALT 34 0 - 44 U/L   Alkaline Phosphatase 316 (H) 38 - 126 U/L   Total Bilirubin 15.7 (H) 0.3 - 1.2 mg/dL   Bilirubin, Direct 11.2 (H) 0.0 - 0.2 mg/dL    Comment: RESULTS CONFIRMED BY MANUAL DILUTION   Indirect Bilirubin 4.5 (H) 0.3 - 0.9 mg/dL    Comment: Performed at [facility], [place] [address] [place], [place] [address]  CBG monitoring, ED     Status: Abnormal   Collection Time: 01/28/19  9:45 PM  Result Value Ref Range   Glucose-Capillary 431 (H) 70 - 99 mg/dL   Comment 1 Notify RN   Urinalysis, Routine w reflex microscopic     Status: Abnormal   Collection Time: 01/29/19 12:47 AM  Result Value Ref Range   Color, Urine AMBER (A) YELLOW    Comment: BIOCHEMICALS MAY BE AFFECTED BY COLOR   APPearance HAZY (A) CLEAR   Specific Gravity, Urine 1.026 1.005 - 1.030   pH 5.0 5.0 - 8.0   Glucose, UA >=500 (A) NEGATIVE mg/dL   Hgb urine dipstick NEGATIVE NEGATIVE   Bilirubin Urine MODERATE (A) NEGATIVE   Ketones, ur NEGATIVE NEGATIVE mg/dL   Protein, ur NEGATIVE NEGATIVE mg/dL    Nitrite NEGATIVE NEGATIVE   Leukocytes,Ua NEGATIVE NEGATIVE   RBC / HPF 0-5 0 - 5 RBC/hpf  WBC, UA 0-5 0 - 5 WBC/hpf   Bacteria, UA NONE SEEN NONE SEEN   Squamous Epithelial / LPF 0-5 0 - 5   Mucus PRESENT    Hyaline Casts, UA PRESENT    Cellular Cast, UA 3     Comment: Performed at [facility], [place] [address] [place], [place] [address]    Chemistries  Recent Labs  Lab 01/28/19 1833  NA 124*  K 4.7  CL 86*  CO2 24  GLUCOSE 514*  BUN 29*  CREATININE 1.15  CALCIUM 8.3*  MG 1.5*  AST 46*  ALT 34  ALKPHOS 316*  BILITOT 15.7*   ------------------------------------------------------------------------------------------------------------------  ------------------------------------------------------------------------------------------------------------------ GFR: Estimated Creatinine Clearance: 73.3 mL/min (by C-G formula based on SCr of 1.15 mg/dL). Liver Function Tests: Recent Labs  Lab 01/28/19 1833  AST 46*  ALT 34  ALKPHOS 316*  BILITOT 15.7*  PROT 6.0*  ALBUMIN 2.1*   No results for input(s): LIPASE, AMYLASE in the last 168 hours. No results for input(s): AMMONIA in the last 168 hours. Coagulation Profile: Recent Labs  Lab 01/28/19 1833  INR 1.3*   Cardiac Enzymes: No results for input(s): CKTOTAL, CKMB, CKMBINDEX, TROPONINI in the last 168 hours. BNP (last 3 results) No results for input(s): PROBNP in the last 8760 hours. HbA1C: No results for input(s): HGBA1C in the last 72 hours. CBG: Recent Labs  Lab 01/28/19 2145  GLUCAP 431*   Lipid Profile: No results for input(s): CHOL, HDL, LDLCALC, TRIG, CHOLHDL, LDLDIRECT in the last 72 hours. Thyroid Function Tests: No results for input(s): TSH, T4TOTAL, FREET4, T3FREE, THYROIDAB in the last 72 hours. Anemia Panel: No results for input(s): VITAMINB12, FOLATE, FERRITIN, TIBC, IRON, RETICCTPCT in the last 72  hours.  --------------------------------------------------------------------------------------------------------------- Urine analysis:    Component Value Date/Time   COLORURINE AMBER (A) 01/29/2019 0047   APPEARANCEUR HAZY (A) 01/29/2019 0047   LABSPEC 1.026 01/29/2019 0047   PHURINE 5.0 01/29/2019 0047   GLUCOSEU >=500 (A) 01/29/2019 0047   GLUCOSEU NEGATIVE 11/15/2014 1516   HGBUR NEGATIVE 01/29/2019 0047   BILIRUBINUR MODERATE (A) 01/29/2019 0047   BILIRUBINUR Positive 11/11/2017 1037   KETONESUR NEGATIVE 01/29/2019 0047   PROTEINUR NEGATIVE 01/29/2019 0047   UROBILINOGEN 1.0 11/11/2017 1037   UROBILINOGEN 2.0 (A) 11/15/2014 1516   NITRITE NEGATIVE 01/29/2019 0047   LEUKOCYTESUR NEGATIVE 01/29/2019 0047      Imaging Results:    CT Abdomen Pelvis W Contrast  Result Date: 01/28/2019 CLINICAL DATA:  Fall right flank hematoma EXAM: CT ABDOMEN AND PELVIS WITH CONTRAST TECHNIQUE: Multidetector CT imaging of the abdomen and pelvis was performed using the standard protocol following bolus administration of intravenous contrast. CONTRAST:  133m OMNIPAQUE IOHEXOL 300 MG/ML  SOLN COMPARISON:  July 27, 2018 FINDINGS: Lower chest: The visualized heart size within normal limits. No pericardial fluid/thickening. No hiatal hernia. Streaky atelectasis seen at both lung bases. Hepatobiliary: Again noted is hepatomegaly with a slightly nodular liver contour, consistent with cirrhosis. There is a low-density lesion in the posterior right liver lobe measuring 6.2 cm, unchanged from the prior CT. The gallbladder appears to be fluid-filled and mildly distended with a small amount of pericholecystic fluid and fat stranding changes. No definite layering gallstones however are present. No biliary ductal dilatation. Pancreas: Unremarkable. No pancreatic ductal dilatation or surrounding inflammatory changes. Spleen: Normal in size without focal abnormality. Adrenals/Urinary Tract: Both adrenal glands appear  normal. The kidneys and collecting system appear normal without evidence of urinary tract calculus or hydronephrosis. Bladder is  unremarkable. Stomach/Bowel: The stomach, small bowel, and colon are normal in appearance. No inflammatory changes, wall thickening, or obstructive findings.Scattered colonic diverticula are noted without diverticulitis. The appendix is unremarkable. Vascular/Lymphatic: There are no enlarged mesenteric, retroperitoneal, or pelvic lymph nodes. A small amount of perihepatic ascites is noted. Scattered aortic atherosclerotic calcifications are seen without aneurysmal dilatation. Reproductive: The prostate contains radiation prostate seeds. Other: No evidence of abdominal wall mass or hernia. Musculoskeletal: No acute or significant osseous findings. IMPRESSION: Mildly distended gallbladder with a small amount of pericholecystic fluid and surrounding inflammatory changes. This could be due to cholecystitis. If further evaluation is required would recommend right upper quadrant ultrasound Hepatomegaly and findings suggestive of cirrhosis. Small perihepatic ascites Aortic Atherosclerosis (ICD10-I70.0). Unchanged 6 cm hepatic cyst. Electronically Signed   By: Prudencio Pair M.D.   On: 01/28/2019 23:23   DG Chest Port 1 View  Result Date: 01/28/2019 CLINICAL DATA:  Shortness of breath, diagnosed with COVID-19 on 01/03/2019, increased fatigue, jaundice, cirrhosis EXAM: PORTABLE CHEST 1 VIEW COMPARISON:  Portable exam 1759 hours compared to 02/19/2018 FINDINGS: Normal heart size, mediastinal contours, and pulmonary vascularity. Atherosclerotic calcification aorta. Mild LEFT basilar atelectasis. Lungs otherwise clear. No pulmonary infiltrate, pleural effusion or pneumothorax. Multilevel endplate spur formation thoracic spine. IMPRESSION: Mild LEFT basilar atelectasis. Electronically Signed   By: Lavonia Dana M.D.   On: 01/28/2019 18:17   nsr at 95, nl axis, nl int, q in v1,2, no st-t changes c/w  ischemia   Assessment & Plan:    Active Problems:   Abnormal liver function  Abdominal pain Abnormal liver function likely secondary to ETOH cirrhosis, r/o cholecystitis Check acute hepatitis panel Check RUQ ultrasound r/o cholecystitis NPO Tramadol 64m po q6h prn  Please consult surgery in AM  Hyponatremia Check serum osm, cortisol, tsh Check urine osm, urine sodium Hydrate with ns iv Check cmp in am  Dm2 STOP Metformin due to abnormal liver function fsbs q4h, ISS  Gerd  Cont PPI  ETOH dep CIWA  Leukocytosis Check cbc in am  Elevated troponin w Q in v1,2 Check trop Check cardiac echo Consider cardiology consult if troponin trending upwards   DVT Prophylaxis-   Lovenox - SCDs   AM Labs Ordered, also please review Full Orders  Family Communication: Admission, patients condition and plan of care including tests being ordered have been discussed with the patient  who indicate understanding and agree with the plan and Code Status.  Code Status:  FULL CODE per patient,   Admission status: Observation: Based on patients clinical presentation and evaluation of above clinical data, I have made determination that patient meets observation criteria at this time. Pt may require inpatient status if has cholecystitis, or troponin increases.    Time spent in minutes : 70 minutes   JJani GravelM.D on 01/29/2019 at 3:51 AM

## 2019-01-29 NOTE — Progress Notes (Signed)
Pharmacy Antibiotic Note  Patrick Kidd is a 73 y.o. male admitted on 01/28/2019 with intra-abdominal infection.  Pharmacy has been consulted for zosyn dosing.  Plan: Zosyn 3.375g IV q8h (4 hour infusion).  F/u scr/cultures  Height: 6\' 2"  (188 cm) Weight: 235 lb (106.6 kg) IBW/kg (Calculated) : 82.2  Temp (24hrs), Avg:98.1 F (36.7 C), Min:98.1 F (36.7 C), Max:98.1 F (36.7 C)  Recent Labs  Lab 01/28/19 1833  WBC 15.4*  CREATININE 1.15    Estimated Creatinine Clearance: 73.3 mL/min (by C-G formula based on SCr of 1.15 mg/dL).    No Known Allergies  Antimicrobials this admission: 12/26 zosyn >>    >>   Dose adjustments this admission:   Microbiology results:  BCx:   UCx:    Sputum:    MRSA PCR:   Thank you for allowing pharmacy to be a part of this patient's care.  Dorrene German 01/29/2019 4:02 AM

## 2019-01-29 NOTE — ED Notes (Signed)
Pt stated he required no restroom assistance at this time, pt denied any BM, or urination, will monitor.

## 2019-01-29 NOTE — Consult Note (Signed)
Reason for Consult:gallstones and jaundice Referring Physician: Dr. Zack Seal is an 73 y.o. male.  HPI: This is a gentleman with known alcoholic cirrhosis and portal hypertension who presents with abdominal pain and jaundice.  He still is drinking alcohol daily.  He reports that his pain occurred only suddenly yesterday.  He has been experiencing worsening jaundice.  He was diagnosed as Covid positive on November 30.  He is also been having occasional falls at home secondary to his weakness.  He also has dyspnea on exertion.  He was found to have a total bilirubin of 15 and a slightly elevated white blood count.  He had a CT scan of the abdomen and pelvis showing findings consistent with his cirrhosis.  There was a mildly distended gallbladder with some pericholecystic fluid and possible inflammatory changes.  There was no dilation of the bile duct.  The bile duct also appeared normal on ultrasound.  He also has a 6 cm hepatic cyst which is unchanged.  Surgery has been asked to evaluate the patient as to whether or not he needs a cholecystectomy.  He reports moderate pain which is continuous in the right upper quadrant.  Past Medical History:  Diagnosis Date  . Acrophobia   . Alcoholic cirrhosis ([place]) 0000000  . Anemia   . Arthritis    foot by big toe  . BPH associated with nocturia   . Cataract    removed both eyes  . Chronic cough    PMH of  . Diabetes mellitus without complication ([place])   . Diverticulosis 07-03-2010   Colonoscopy.   . Duodenal ulcer 2017  . Fallen arches    Bilateral  . GERD (gastroesophageal reflux disease)   . Gout   . Granuloma annulare   . Hx of adenomatous colonic polyps multiple  . Hydrocele 2011   Large septated right hydrocele  . Liver cyst   . Liver lesion   . Nonspecific elevation of levels of transaminase or lactic acid dehydrogenase (LDH)   . Obesity   . Peripheral neuropathy   . Plantar fasciitis    PMH of  . Portal  hypertension ([place]) 2017  . Prostate cancer ([place])   . Right shoulder pain 11/2017  . Sleep apnea    no cpap, patient denies  . Wears reading eyeglasses     Past Surgical History:  Procedure Laterality Date  . CATARACT EXTRACTION, BILATERAL  12/2011    Dr Gershon Crane  . COLONOSCOPY  2017  . COLONOSCOPY W/ POLYPECTOMY  07/03/2010   2 adenomas, diverticulosis on right. Dr [name]  . CYSTOSCOPY N/A 03/15/2018   Procedure: CYSTOSCOPY FLEXIBLE;  Surgeon: [name], MD;  Location: [facility];  Service: Urology;  Laterality: N/A;  NO SEEDS FOUND IN BLADDER  . ESOPHAGOGASTRODUODENOSCOPY (EGD) WITH PROPOFOL N/A 01/08/2016   Procedure: ESOPHAGOGASTRODUODENOSCOPY (EGD) WITH PROPOFOL;  Surgeon: [name], MD;  Location: [facility];  Service: Endoscopy;  Laterality: N/A;  . FLEXIBLE SIGMOIDOSCOPY  2000  . RADIOACTIVE SEED IMPLANT N/A 03/15/2018   Procedure: RADIOACTIVE SEED IMPLANT/BRACHYTHERAPY IMPLANT;  Surgeon: [name], MD;  Location: [facility];  Service: Urology;  Laterality: N/A;   72 SEEDS IMPLANTED  . SIGMOIDOSCOPY    . SPACE OAR INSTILLATION N/A 03/15/2018   Procedure: SPACE OAR INSTILLATION;  Surgeon: [name], MD;  Location: [facility];  Service: Urology;  Laterality: N/A;  . WISDOM TOOTH EXTRACTION  Family History  Problem Relation Age of Onset  . Lung cancer Mother        smoker  . Leukemia Father        Acute myelocytic  . Diabetes Neg Hx   . Stroke Neg Hx   . Heart disease Neg Hx   . Colon cancer Neg Hx   . Colon polyps Neg Hx   . Esophageal cancer Neg Hx   . Rectal cancer Neg Hx   . Stomach cancer Neg Hx     Social History:  reports that he quit smoking about 48 years ago. His smoking use included cigarettes. He has a 12.00 pack-year smoking history. He has never used smokeless tobacco. He reports current alcohol use of about 28.0 standard drinks of alcohol per week. He reports that he does  not use drugs.  Allergies: No Known Allergies  Medications: I have reviewed the patient's current medications.  Results for orders placed or performed during the hospital encounter of 01/28/19 (from the past 48 hour(s))  CBC with Differential     Status: Abnormal   Collection Time: 01/28/19  6:33 PM  Result Value Ref Range   WBC 15.4 (H) 4.0 - 10.5 K/uL   RBC 3.79 (L) 4.22 - 5.81 MIL/uL   Hemoglobin 12.8 (L) 13.0 - 17.0 g/dL   HCT 36.4 (L) 39.0 - 52.0 %   MCV 96.0 80.0 - 100.0 fL   MCH 33.8 26.0 - 34.0 pg   MCHC 35.2 30.0 - 36.0 g/dL   RDW 12.9 11.5 - 15.5 %   Platelets 97 (L) 150 - 400 K/uL    Comment: Immature Platelet Fraction may be clinically indicated, consider ordering this additional test [id number]    nRBC 0.0 0.0 - 0.2 %   Neutrophils Relative % 79 %   Neutro Abs 12.2 (H) 1.7 - 7.7 K/uL   Lymphocytes Relative 10 %   Lymphs Abs 1.5 0.7 - 4.0 K/uL   Monocytes Relative 8 %   Monocytes Absolute 1.3 (H) 0.1 - 1.0 K/uL   Eosinophils Relative 0 %   Eosinophils Absolute 0.0 0.0 - 0.5 K/uL   Basophils Relative 1 %   Basophils Absolute 0.1 0.0 - 0.1 K/uL   Immature Granulocytes 2 %   Abs Immature Granulocytes 0.36 (H) 0.00 - 0.07 K/uL    Comment: Performed at [facility], [place] [address], [place], [place] [address]  Protime-INR     Status: Abnormal   Collection Time: 01/28/19  6:33 PM  Result Value Ref Range   Prothrombin Time 16.2 (H) 11.4 - 15.2 seconds   INR 1.3 (H) 0.8 - 1.2    Comment: (NOTE) INR goal varies based on device and disease states. Performed at [facility], [place] [address] [place], [place] [address]   Magnesium     Status: Abnormal   Collection Time: 01/28/19  6:33 PM  Result Value Ref Range   Magnesium 1.5 (L) 1.7 - 2.4 mg/dL    Comment: Performed at [facility], [place] [address] [place], [place] [id number]  Basic metabolic panel     Status: Abnormal   Collection Time: 01/28/19  6:33 PM   Result Value Ref Range   Sodium 124 (L) 135 - 145 mmol/L   Potassium 4.7 3.5 - 5.1 mmol/L   Chloride 86 (L) 98 - 111 mmol/L   CO2 24 22 - 32 mmol/L   Glucose, Bld 514 (HH) 70 - 99 mg/dL    Comment:  CRITICAL RESULT CALLED TO, READ BACK BY AND VERIFIED WITH: J CURETON,RN 01/28/19 1951 RHOLMES    BUN 29 (H) 8 - 23 mg/dL   Creatinine, Ser 1.15 0.61 - 1.24 mg/dL   Calcium 8.3 (L) 8.9 - 10.3 mg/dL   GFR calc non Af Amer >60 >60 mL/min   GFR calc Af Amer >60 >60 mL/min   Anion gap 14 5 - 15    Comment: Performed at [facility], [place] [address] [place], [place] [address]  Hepatic function panel     Status: Abnormal   Collection Time: 01/28/19  6:33 PM  Result Value Ref Range   Total Protein 6.0 (L) 6.5 - 8.1 g/dL   Albumin 2.1 (L) 3.5 - 5.0 g/dL   AST 46 (H) 15 - 41 U/L   ALT 34 0 - 44 U/L   Alkaline Phosphatase 316 (H) 38 - 126 U/L   Total Bilirubin 15.7 (H) 0.3 - 1.2 mg/dL   Bilirubin, Direct 11.2 (H) 0.0 - 0.2 mg/dL    Comment: RESULTS CONFIRMED BY MANUAL DILUTION   Indirect Bilirubin 4.5 (H) 0.3 - 0.9 mg/dL    Comment: Performed at [facility], [place] [address], [place], [place] [address]  CBG monitoring, ED     Status: Abnormal   Collection Time: 01/28/19  9:45 PM  Result Value Ref Range   Glucose-Capillary 431 (H) 70 - 99 mg/dL   Comment 1 Notify RN   Urinalysis, Routine w reflex microscopic     Status: Abnormal   Collection Time: 01/29/19 12:47 AM  Result Value Ref Range   Color, Urine AMBER (A) YELLOW    Comment: BIOCHEMICALS MAY BE AFFECTED BY COLOR   APPearance HAZY (A) CLEAR   Specific Gravity, Urine 1.026 1.005 - 1.030   pH 5.0 5.0 - 8.0   Glucose, UA >=500 (A) NEGATIVE mg/dL   Hgb urine dipstick NEGATIVE NEGATIVE   Bilirubin Urine MODERATE (A) NEGATIVE   Ketones, ur NEGATIVE NEGATIVE mg/dL   Protein, ur NEGATIVE NEGATIVE mg/dL   Nitrite NEGATIVE NEGATIVE   Leukocytes,Ua NEGATIVE NEGATIVE   RBC / HPF 0-5 0 - 5 RBC/hpf    WBC, UA 0-5 0 - 5 WBC/hpf   Bacteria, UA NONE SEEN NONE SEEN   Squamous Epithelial / LPF 0-5 0 - 5   Mucus PRESENT    Hyaline Casts, UA PRESENT    Cellular Cast, UA 3     Comment: Performed at [facility], [place] [address] [place], [place] [address]  CBG monitoring, ED     Status: Abnormal   Collection Time: 01/29/19  4:37 AM  Result Value Ref Range   Glucose-Capillary 335 (H) 70 - 99 mg/dL  CBC     Status: Abnormal   Collection Time: 01/29/19  5:00 AM  Result Value Ref Range   WBC 15.8 (H) 4.0 - 10.5 K/uL   RBC 4.06 (L) 4.22 - 5.81 MIL/uL   Hemoglobin 13.7 13.0 - 17.0 g/dL   HCT 38.9 (L) 39.0 - 52.0 %   MCV 95.8 80.0 - 100.0 fL   MCH 33.7 26.0 - 34.0 pg   MCHC 35.2 30.0 - 36.0 g/dL   RDW 13.3 11.5 - 15.5 %   Platelets 151 150 - 400 K/uL   nRBC 0.0 0.0 - 0.2 %    Comment: Performed at [facility], [place] [address] [place], [place] [address]  Protime-INR     Status: Abnormal   Collection Time: 01/29/19  5:00 AM  Result Value Ref Range   Prothrombin Time 15.6 (H) 11.4 - 15.2 seconds   INR 1.3 (H) 0.8 - 1.2    Comment: (NOTE) INR goal varies based on device and disease states. Performed at [facility], [place] [address], [place], [place] [address]   Acetaminophen level     Status: Abnormal   Collection Time: 01/29/19  5:00 AM  Result Value Ref Range   Acetaminophen (Tylenol), Serum <10 (L) 10 - 30 ug/mL    Comment: (NOTE) Therapeutic concentrations vary significantly. A range of 10-30 ug/mL  may be an effective concentration for many patients. However, some  are best treated at concentrations outside of this range. Acetaminophen concentrations >150 ug/mL at 4 hours after ingestion  and >50 ug/mL at 12 hours after ingestion are often associated with  toxic reactions. Performed at [facility], [place] [address], [place], [place] [id number]   Salicylate level     Status: Abnormal   Collection Time:  01/29/19  5:00 AM  Result Value Ref Range   Salicylate Lvl [id number] (L) 7.0 - 30.0 mg/dL    Comment: Performed at [facility], [place] [address], [place], [place] [address]  Osmolality     Status: None   Collection Time: 01/29/19  5:00 AM  Result Value Ref Range   Osmolality 292 275 - 295 mOsm/kg    Comment: Performed at [place] [address], [place], [place] [address]  TSH     Status: None   Collection Time: 01/29/19  5:00 AM  Result Value Ref Range   TSH 1.830 0.350 - 4.500 uIU/mL    Comment: Performed by a 3rd Generation assay with a functional sensitivity of <=0.01 uIU/mL. Performed at [facility], [place] [address] [place], [place] [address]   Troponin I (High Sensitivity)     Status: Abnormal   Collection Time: 01/29/19  5:00 AM  Result Value Ref Range   Troponin I (High Sensitivity) 18 (H) <18 ng/L    Comment: (NOTE) Elevated high sensitivity troponin I (hsTnI) values and significant  changes across serial measurements may suggest ACS but many other  chronic and acute conditions are known to elevate hsTnI results.  Refer to the Links section for chest pain algorithms and additional  guidance. Performed at [facility], [place] [address] [place], [place] [address]   Troponin I (High Sensitivity)     Status: None   Collection Time: 01/29/19  6:26 AM  Result Value Ref Range   Troponin I (High Sensitivity) 17 <18 ng/L    Comment: (NOTE) Elevated high sensitivity troponin I (hsTnI) values and significant  changes across serial measurements may suggest ACS but many other  chronic and acute conditions are known to elevate hsTnI results.  Refer to the Links section for chest pain algorithms and additional  guidance. Performed at [facility], [place] [address], [place], [place] [address]   Comprehensive metabolic panel     Status: Abnormal   Collection Time: 01/29/19  6:26 AM  Result Value Ref  Range   Sodium 128 (L) 135 - 145 mmol/L   Potassium 3.8 3.5 - 5.1 mmol/L    Comment: DELTA CHECK NOTED   Chloride 88 (L) 98 - 111 mmol/L   CO2 26 22 - 32 mmol/L   Glucose, Bld 300 (H) 70 - 99 mg/dL   BUN 28 (H) 8 - 23 mg/dL   Creatinine, Ser 1.04  0.61 - 1.24 mg/dL   Calcium 8.2 (L) 8.9 - 10.3 mg/dL   Total Protein 6.1 (L) 6.5 - 8.1 g/dL   Albumin 2.1 (L) 3.5 - 5.0 g/dL   AST 51 (H) 15 - 41 U/L   ALT 37 0 - 44 U/L   Alkaline Phosphatase 301 (H) 38 - 126 U/L   Total Bilirubin 15.6 (H) 0.3 - 1.2 mg/dL   GFR calc non Af Amer >60 >60 mL/min   GFR calc Af Amer >60 >60 mL/min   Anion gap 14 5 - 15    Comment: Performed at [facility], [place] [address], [place], [place] [address]  CBC     Status: Abnormal   Collection Time: 01/29/19  6:26 AM  Result Value Ref Range   WBC 12.5 (H) 4.0 - 10.5 K/uL   RBC 4.00 (L) 4.22 - 5.81 MIL/uL   Hemoglobin 13.4 13.0 - 17.0 g/dL   HCT 38.9 (L) 39.0 - 52.0 %   MCV 97.3 80.0 - 100.0 fL   MCH 33.5 26.0 - 34.0 pg   MCHC 34.4 30.0 - 36.0 g/dL   RDW 13.2 11.5 - 15.5 %   Platelets 133 (L) 150 - 400 K/uL    Comment: REPEATED TO VERIFY PLATELET COUNT CONFIRMED BY SMEAR Immature Platelet Fraction may be clinically indicated, consider ordering this additional test [id number]    nRBC 0.0 0.0 - 0.2 %    Comment: Performed at [facility], [place] [address] [place], [place] [address]  Lactate dehydrogenase     Status: None   Collection Time: 01/29/19  6:26 AM  Result Value Ref Range   LDH 127 98 - 192 U/L    Comment: Performed at [facility], [place] [address] [place], [place] [address]  Fibrinogen     Status: Abnormal   Collection Time: 01/29/19  6:26 AM  Result Value Ref Range   Fibrinogen >800 (H) 210 - 475 mg/dL    Comment: Performed at [facility], [place] [address], [place], [place] [address]  Procalcitonin - Baseline     Status: None   Collection Time: 01/29/19  6:26 AM  Result  Value Ref Range   Procalcitonin 3.89 ng/mL    Comment:        Interpretation: PCT > 2 ng/mL: Systemic infection (sepsis) is likely, unless other causes are known. (NOTE)       Sepsis PCT Algorithm           Lower Respiratory Tract                                      Infection PCT Algorithm    ----------------------------     ----------------------------         PCT < 0.25 ng/mL                PCT < 0.10 ng/mL         Strongly encourage             Strongly discourage   discontinuation of antibiotics    initiation of antibiotics    ----------------------------     -----------------------------       PCT 0.25 - 0.50 ng/mL            PCT 0.10 - 0.25 ng/mL               OR       >  80% decrease in PCT            Discourage initiation of                                            antibiotics      Encourage discontinuation           of antibiotics    ----------------------------     -----------------------------         PCT >= 0.50 ng/mL              PCT 0.26 - 0.50 ng/mL               AND       <80% decrease in PCT              Encourage initiation of                                             antibiotics       Encourage continuation           of antibiotics    ----------------------------     -----------------------------        PCT >= 0.50 ng/mL                  PCT > 0.50 ng/mL               AND         increase in PCT                  Strongly encourage                                      initiation of antibiotics    Strongly encourage escalation           of antibiotics                                     -----------------------------                                           PCT <= 0.25 ng/mL                                                 OR                                        > 80% decrease in PCT                                     Discontinue / Do not initiate  antibiotics Performed at [facility], [place]  [address] [place], [place] [address]   D-dimer, quantitative (not at [facility])     Status: Abnormal   Collection Time: 01/29/19  6:26 AM  Result Value Ref Range   D-Dimer, Quant 3.70 (H) 0.00 - 0.50 ug/mL-FEU    Comment: (NOTE) At the manufacturer cut-off of 0.50 ug/mL FEU, this assay has been documented to exclude PE with a sensitivity and negative predictive value of 97 to 99%.  At this time, this assay has not been approved by the FDA to exclude DVT/VTE. Results should be correlated with clinical presentation. Performed at [facility], [place] [address], [place], [place] [address]   Ferritin     Status: Abnormal   Collection Time: 01/29/19  6:34 AM  Result Value Ref Range   Ferritin 996 (H) 24 - 336 ng/mL    Comment: Performed at [facility], [place] [address], [place], [place] [address]  CBG monitoring, ED     Status: Abnormal   Collection Time: 01/29/19  7:52 AM  Result Value Ref Range   Glucose-Capillary 278 (H) 70 - 99 mg/dL    CT Abdomen Pelvis W Contrast  Result Date: 01/28/2019 CLINICAL DATA:  Fall right flank hematoma EXAM: CT ABDOMEN AND PELVIS WITH CONTRAST TECHNIQUE: Multidetector CT imaging of the abdomen and pelvis was performed using the standard protocol following bolus administration of intravenous contrast. CONTRAST:  118mL OMNIPAQUE IOHEXOL 300 MG/ML  SOLN COMPARISON:  July 27, 2018 FINDINGS: Lower chest: The visualized heart size within normal limits. No pericardial fluid/thickening. No hiatal hernia. Streaky atelectasis seen at both lung bases. Hepatobiliary: Again noted is hepatomegaly with a slightly nodular liver contour, consistent with cirrhosis. There is a low-density lesion in the posterior right liver lobe measuring 6.2 cm, unchanged from the prior CT. The gallbladder appears to be fluid-filled and mildly distended with a small amount of pericholecystic fluid and fat stranding changes. No definite layering gallstones however  are present. No biliary ductal dilatation. Pancreas: Unremarkable. No pancreatic ductal dilatation or surrounding inflammatory changes. Spleen: Normal in size without focal abnormality. Adrenals/Urinary Tract: Both adrenal glands appear normal. The kidneys and collecting system appear normal without evidence of urinary tract calculus or hydronephrosis. Bladder is unremarkable. Stomach/Bowel: The stomach, small bowel, and colon are normal in appearance. No inflammatory changes, wall thickening, or obstructive findings.Scattered colonic diverticula are noted without diverticulitis. The appendix is unremarkable. Vascular/Lymphatic: There are no enlarged mesenteric, retroperitoneal, or pelvic lymph nodes. A small amount of perihepatic ascites is noted. Scattered aortic atherosclerotic calcifications are seen without aneurysmal dilatation. Reproductive: The prostate contains radiation prostate seeds. Other: No evidence of abdominal wall mass or hernia. Musculoskeletal: No acute or significant osseous findings. IMPRESSION: Mildly distended gallbladder with a small amount of pericholecystic fluid and surrounding inflammatory changes. This could be due to cholecystitis. If further evaluation is required would recommend right upper quadrant ultrasound Hepatomegaly and findings suggestive of cirrhosis. Small perihepatic ascites Aortic Atherosclerosis (ICD10-I70.0). Unchanged 6 cm hepatic cyst. Electronically Signed   By: Prudencio Pair M.D.   On: 01/28/2019 23:23   DG Chest Port 1 View  Result Date: 01/28/2019 CLINICAL DATA:  Shortness of breath, diagnosed with COVID-19 on 01/03/2019, increased fatigue, jaundice, cirrhosis EXAM: PORTABLE CHEST 1 VIEW COMPARISON:  Portable exam 1759 hours compared to 02/19/2018 FINDINGS: Normal heart size, mediastinal contours, and pulmonary vascularity. Atherosclerotic calcification aorta. Mild LEFT basilar atelectasis. Lungs otherwise clear. No pulmonary infiltrate, pleural effusion or  pneumothorax.  Multilevel endplate spur formation thoracic spine. IMPRESSION: Mild LEFT basilar atelectasis. Electronically Signed   By: Lavonia Dana M.D.   On: 01/28/2019 18:17   US Abdomen Limited RUQ  Result Date: 01/29/2019 CLINICAL DATA:  Jaundice EXAM: ULTRASOUND ABDOMEN LIMITED RIGHT UPPER QUADRANT COMPARISON:  None. FINDINGS: Gallbladder: Layering sludge/stones seen within gallbladder there is a edematous mildly prominent gallbladder wall measuring 3.1 mm. No sonographic Murphy sign. Common bile duct: Diameter: 3.7 mm Liver: Increased echotexture seen throughout. There is an anechoic cyst within the right liver lobe measuring 7.3 x 5.7 x 5.1 cm. The main portal vein appears patent. Other: None. IMPRESSION: Layering sludge/stones with mild gallbladder wall thickening. This could be due to mild acute cholecystitis. Electronically Signed   By: Prudencio Pair M.D.   On: 01/29/2019 01:55    Review of Systems  Constitutional: Positive for fatigue.  Respiratory: Positive for shortness of breath.   Cardiovascular: Negative for chest pain.  Gastrointestinal: Positive for abdominal distention, abdominal pain and nausea. Negative for vomiting.  Genitourinary: Negative for difficulty urinating.  Musculoskeletal: Positive for myalgias.  All other systems reviewed and are negative.  Blood pressure (!) 123/57, pulse (!) 102, temperature 98.1 F (36.7 C), temperature source Oral, resp. rate 20, height 6\' 2"  (1.88 m), weight 106.6 kg, SpO2 100 %. Physical Exam  Constitutional: He is oriented to person, place, and time.  Obese gentleman who does not appear to be in acute distress but is uncomfortable  HENT:  Head: Normocephalic and atraumatic.  Right Ear: External ear normal.  Left Ear: External ear normal.  Nose: Nose normal.  Eyes: Pupils are equal, round, and reactive to light. Scleral icterus is present.  Neck: No tracheal deviation present.  Cardiovascular: Intact distal pulses.  No murmur  heard. Mildly tachycardic with regular rhythm  Respiratory: No respiratory distress.  Mild decreased breath sounds at the bases  GI:  Obese abdomen with some tenderness in the upper abdomen.  Musculoskeletal:        General: No deformity or edema. Normal range of motion.     Cervical back: Normal range of motion and neck supple.  Neurological: He is alert and oriented to person, place, and time.  Skin: Skin is warm and dry. No erythema.  Psychiatric: His behavior is normal. Judgment normal.    Assessment/Plan: Alcoholic cirrhosis with jaundice, gallstones  I have reviewed his CT scan, ultrasound, and laboratory data.  He clearly has alcoholic cirrhosis.  I would doubt choledocholithiasis given that there is no dilation of the bile duct on CT or ultrasound.  He still may need an MR CP.  Gastroenterology will need to consult on the patient.  Based on his laboratory data, he is a Child's class C cirrhotic and so would not be a candidate for surgical intervention.  Currently, it is difficult to tell whether this is truly symptomatic gallstones or his liver disease.  I am uncertain of whether a HIDA scan would help determine whether there was cholecystitis given his cirrhosis.  He needs further work-up to determine if he is even a candidate for a cholecystostomy tube by interventional radiology.  We will follow him with you.  Coralie Keens 01/29/2019, 9:32 AM

## 2019-01-30 LAB — COMPREHENSIVE METABOLIC PANEL
ALT: 31 U/L (ref 0–44)
AST: 56 U/L — ABNORMAL HIGH (ref 15–41)
Albumin: 2 g/dL — ABNORMAL LOW (ref 3.5–5.0)
Alkaline Phosphatase: 253 U/L — ABNORMAL HIGH (ref 38–126)
Anion gap: 19 — ABNORMAL HIGH (ref 5–15)
BUN: 31 mg/dL — ABNORMAL HIGH (ref 8–23)
CO2: 23 mmol/L (ref 22–32)
Calcium: 7.5 mg/dL — ABNORMAL LOW (ref 8.9–10.3)
Chloride: 89 mmol/L — ABNORMAL LOW (ref 98–111)
Creatinine, Ser: 0.72 mg/dL (ref 0.61–1.24)
GFR calc Af Amer: >60 mL/min (ref >60)
GFR calc non Af Amer: >60 mL/min (ref >60)
Glucose, Bld: 272 mg/dL — ABNORMAL HIGH (ref 70–99)
Potassium: 4.5 mmol/L (ref 3.5–5.1)
Sodium: 131 mmol/L — ABNORMAL LOW (ref 135–145)
Total Bilirubin: 13.6 mg/dL — ABNORMAL HIGH (ref 0.3–1.2)
Total Protein: 5.9 g/dL — ABNORMAL LOW (ref 6.5–8.1)

## 2019-01-30 LAB — PROTIME-INR
INR: 1.3 — ABNORMAL HIGH (ref 0.8–1.2)
Prothrombin Time: 15.8 seconds — ABNORMAL HIGH (ref 11.4–15.2)

## 2019-01-30 LAB — GLUCOSE, CAPILLARY
Glucose-Capillary: 272 mg/dL — ABNORMAL HIGH (ref 70–99)
Glucose-Capillary: 230 mg/dL — ABNORMAL HIGH (ref 70–99)
Glucose-Capillary: 259 mg/dL — ABNORMAL HIGH (ref 70–99)
Glucose-Capillary: 229 mg/dL — ABNORMAL HIGH (ref 70–99)
Glucose-Capillary: 185 mg/dL — ABNORMAL HIGH (ref 70–99)

## 2019-01-30 LAB — C-REACTIVE PROTEIN: CRP: 35.5 mg/dL — ABNORMAL HIGH (ref <1.0)

## 2019-01-30 LAB — CBC
HCT: 35.1 % — ABNORMAL LOW (ref 39.0–52.0)
Hemoglobin: 11.9 g/dL — ABNORMAL LOW (ref 13.0–17.0)
MCH: 33.6 pg (ref 26.0–34.0)
MCHC: 33.9 g/dL (ref 30.0–36.0)
MCV: 99.2 fL (ref 80.0–100.0)
Platelets: 125 10*3/uL — ABNORMAL LOW (ref 150–400)
RBC: 3.54 MIL/uL — ABNORMAL LOW (ref 4.22–5.81)
RDW: 13.5 % (ref 11.5–15.5)
WBC: 21.5 10*3/uL — ABNORMAL HIGH (ref 4.0–10.5)
nRBC: 0 % (ref 0.0–0.2)

## 2019-01-30 LAB — LIPASE, BLOOD: Lipase: 13 U/L (ref 11–51)

## 2019-01-30 LAB — D-DIMER, QUANTITATIVE: D-Dimer, Quant: 2.75 ug/mL-FEU — ABNORMAL HIGH (ref 0.00–0.50)

## 2019-01-30 MED ORDER — ALBUMIN HUMAN 25 % IV SOLN: 25.0000 g | Freq: Once | INTRAVENOUS | Status: DC

## 2019-01-30 MED ORDER — ALBUMIN HUMAN 25 % IV SOLN
25.0000 g | Freq: Every day | INTRAVENOUS | Status: AC
  Administered 2019-01-30 – 2019-02-01 (×3): 25 g via INTRAVENOUS
  Filled 2019-01-30 (×3): qty 100

## 2019-01-30 MED ORDER — BISACODYL 10 MG RE SUPP
10.0000 mg | Freq: Once | RECTAL | Status: AC
  Administered 2019-01-30: 10 mg via RECTAL
  Filled 2019-01-30: qty 1

## 2019-01-30 MED ORDER — INSULIN GLARGINE 100 UNIT/ML ~~LOC~~ SOLN
15.0000 [IU] | Freq: Every day | SUBCUTANEOUS | Status: DC
  Administered 2019-01-30: 15 [IU] via SUBCUTANEOUS
  Filled 2019-01-30 (×2): qty 0.15

## 2019-01-30 MED ORDER — BENZONATATE 100 MG PO CAPS
200.0000 mg | ORAL_CAPSULE | Freq: Three times a day (TID) | ORAL | Status: DC | PRN
  Administered 2019-01-30 – 2019-02-09 (×7): 200 mg via ORAL
  Filled 2019-01-30 (×7): qty 2

## 2019-01-30 NOTE — Progress Notes (Signed)
PROGRESS NOTE    Patrick Kidd  ZOX:[id number] DOB: 08-31-1945 DOA: 01/28/2019 PCP: [name], MD    Brief Narrative:   Patrick Kidd is a 73 year old Caucasian male with past medical history remarkable for type 2 diabetes mellitus, EtOH cirrhosis, portal hypertension, PUD, BPH, recent Covid-19 infection (01/03/2019)  that was treated with steroids who presented to the ED with right-sided epigastric abdominal pain.  He also reported generalized weakness, fatigue and yellowing to his skin.  He has been drinking vodka, reports roughly sixteen 8 ounce drinks per day.  Denies any nausea, vomiting, diarrhea or hematochezia.  No blood in stool.  In the ED, labs remarkable for transaminitis with obstructive pattern with alk phosphatase of 316, total bilirubin 11.2, indirect bilirubin 4.5, procalcitonin 3.9, CRP 27.2, the BC count 15.8, hemoglobin 13.7.  CT abdomen/pelvis with mildly distended gallbladder with surrounding inflammatory changes, small amount of pericholecystic fluid, findings suggestive of cirrhosis, and possible underlying cholecystitis.  Patient was referred for admission for possible treatment of acute cholecystitis.   Assessment & Plan:   Principal Problem:   Acute cholecystitis Active Problems:   GERD   Type 2 diabetes mellitus with complication, with long-term current use of insulin (HCC)   Essential hypertension   Transaminitis   COVID-19 virus infection  Abdominal pain secondary to acute cholecystitis CT abdomen/pelvis with findings suggestive of acute cholecystitis and right upper quadrant ultrasound notable for layering sludge/gallstones with mild gallbladder wall thickening likely secondary to mild acute cholecystitis; common bile duct diameter 3.7 mm. Lipase 13.  --[facility] following; appreciate assistance --WBC 15.4-->15.8-->12.5-->21.5 --PCT 3.89 --Gen Surg-->Not a candidate for surgical intervention secondary to Childs Class C  cirrhosis  --GI--> no indication for ERCP given normal CBD, and HIDA not useful with high bilirubin; ?MRCP --Continue empiric antibiotics with Zosyn --If WBC continues to rise on abx, may need cholecystostomy tube placement  Decompensated EtOH cirrhosis, acute Portal hypertension Etiology likely recent history of Covid-19 with steroid use versus uncontrolled diabetes versus continued EtOH abuse.  CT abdomen with hepatomegaly, cirrhosis and small perihepatic ascites. --GI following as above, no indication for steriods at this time --MELD-Na = 28 on admission, today down to 24 --ammonia level 31 --AST 46-->51-->56 --ALT 34-->37-->31 --AP 316-->301-->253 --Tbili 15.7-->15.6-->13.6 --INR 1.3-->1.3-->1.3 --Continue lactulose 20g PO BID --Vitamin K '5mg'$  PO daily x 3 days per GI --Salt restricted diet --Continue supportive care, trend CBC, LFTs, PT/INR  EtOH abuse disorder --CIWAA protocol with symptom triggered Ativan --Thiamine, folate acid, multivitamin  GERD: Continue PPI  T2DM, NIDDM Hemoglobin A1c 6.10 November 2018. On metformin outpatient --Started on consistent carbohydrate diet on 01/29/2019 --Blood sugars elevated this morning --Start Lantus 15 units subcutaneously daily --Insulin sliding scale for further coverage --Repeat hemoglobin A1c in a.m. --CBGs q4h  Hyponatremia Likely multifactorial with dehydration from poor oral intake, pseudohyponatremia secondary to hyperglycemia. --Na 124-->131 today --Discontinued IV fluids --Started on diet as above --Follow BMP daily  Recent Covid-19 infection Diagnosed on 01/03/2019, treated with steroids.  Currently oxygenating well on room air. --Discontinue airborne/contact isolation precautions as has been greater than 3 weeks since infection  Morbid obesity Body mass index is 31.33 kg/m. Discussed with patient needs for aggressive lifestyle changes and weight loss as this complicates all facets of  care.  Weakness/debility: --PT evaluation pending   DVT prophylaxis: SCD's Code Status: Full Code Family Communication: Updated patient's spouse, Patrick Kidd and Patrick Kidd via telephone this afternoon Disposition Plan: Remain inpatient, IV antibiotics, further dependent on clinical course  Consultants:   [facility]  General Surgery - Dr. Ninfa Linden  Procedures:   none  Antimicrobials:   Zosyn 12/26   Subjective: Patient seen and examined bedside, resting comfortably.  States right upper quadrant abdominal pain is now almost completely resolved.  Complaining of some left lower quadrant tenderness.  Reports no bowel movement over the last few days.  White count has increased up to 21.5 today.  Continues on IV Zosyn.  General surgery and gastroenterology following.  Patient without any other complaints this morning.  Denies headache, no visual changes, no chest pain, no shortness of breath, no nausea/vomiting/diarrhea, no cough/congestion, no fever/chills/night sweats, no palpitations.  No acute events overnight per nursing staff.  Objective: Vitals:   01/29/19 1630 01/29/19 1725 01/29/19 2030 01/30/19 0422  BP: (!) 150/78 (!) 119/58 128/68 120/67  Pulse: (!) 109 100 94 95  Resp: (!) 26  20 20   Temp:  97.9 F (36.6 C) 97.6 F (36.4 C) 97.7 F (36.5 C)  TempSrc:  Oral Oral Oral  SpO2: 98% 99% 98% 98%  Weight:    110.7 kg  Height:        Intake/Output Summary (Last 24 hours) at 01/30/2019 1225 Last data filed at 01/30/2019 0510 Gross per 24 hour  Intake 2066.58 ml  Output 500 ml  Net 1566.58 ml   Filed Weights   01/28/19 1729 01/30/19 0422  Weight: 106.6 kg 110.7 kg    Examination:  General exam: Appears calm and comfortable, appears older than stated age, nontoxic in appearance Respiratory system: Clear to auscultation. Respiratory effort normal. Cardiovascular system: S1 & S2 heard, RRR. No JVD, murmurs, rubs, gallops or clicks. No pedal edema. Gastrointestinal  system: Abdomen is protuberant, soft with mild tenderness to left lower quadrant, no rebound/guarding; no tenderness right upper quadrant. Normal bowel sounds heard. Central nervous system: Alert and oriented to person (Patrick Kidd) Year (2020), and place (Patrick Kidd). No focal neurological deficits. Extremities: Symmetric 5 x 5 power. Skin: No rashes, lesions or ulcers Psychiatry: Judgement and insight appear poor. Mood & affect appropriate.     Data Reviewed: I have personally reviewed following labs and imaging studies  CBC: Recent Labs  Lab 01/28/19 1833 01/29/19 0500 01/29/19 0626 01/30/19 0332  WBC 15.4* 15.8* 12.5* 21.5*  NEUTROABS 12.2*  --   --   --   HGB 12.8* 13.7 13.4 11.9*  HCT 36.4* 38.9* 38.9* 35.1*  MCV 96.0 95.8 97.3 99.2  PLT 97* 151 133* 448*   Basic Metabolic Panel: Recent Labs  Lab 01/28/19 1833 01/29/19 0626 01/30/19 0332  NA 124* 128* 131*  K 4.7 3.8 4.5  CL 86* 88* 89*  CO2 24 26 23   GLUCOSE 514* 300* 272*  BUN 29* 28* 31*  CREATININE 1.15 1.04 0.72  CALCIUM 8.3* 8.2* 7.5*  MG 1.5*  --   --    GFR: Estimated Creatinine Clearance: 107.3 mL/min (by C-G formula based on SCr of 0.72 mg/dL). Liver Function Tests: Recent Labs  Lab 01/28/19 1833 01/29/19 0626 01/30/19 0332  AST 46* 51* 56*  ALT 34 37 31  ALKPHOS 316* 301* 253*  BILITOT 15.7* 15.6* 13.6*  PROT 6.0* 6.1* 5.9*  ALBUMIN 2.1* 2.1* 2.0*   Recent Labs  Lab 01/30/19 0332  LIPASE 13   Recent Labs  Lab 01/29/19 1143  AMMONIA 31   Coagulation Profile: Recent Labs  Lab 01/28/19 1833 01/29/19 0500 01/30/19 0332  INR 1.3* 1.3* 1.3*   Cardiac Enzymes: Recent Labs  Lab 01/29/19 0500  CKTOTAL 12*  CKMB 1.6   BNP (last 3 results) No results for input(s): PROBNP in the last 8760 hours. HbA1C: No results for input(s): HGBA1C in the last 72 hours. CBG: Recent Labs  Lab 01/29/19 2024 01/29/19 2345 01/30/19 0415 01/30/19 0752 01/30/19 1220  GLUCAP 304* 250* 272* 230* 259*    Lipid Profile: No results for input(s): CHOL, HDL, LDLCALC, TRIG, CHOLHDL, LDLDIRECT in the last 72 hours. Thyroid Function Tests: Recent Labs    01/29/19 0500  TSH 1.830   Anemia Panel: Recent Labs    01/29/19 0634  FERRITIN 996*   Sepsis Labs: Recent Labs  Lab 01/29/19 0626  PROCALCITON 3.89    Recent Results (from the past 240 hour(s))  Culture, blood (routine x 2)     Status: None (Preliminary result)   Collection Time: 01/29/19 12:23 AM   Specimen: BLOOD  Result Value Ref Range Status   Specimen Description   Final    BLOOD RIGHT ANTECUBITAL Performed at [facility], [place] [address], [place], [place] [address]    Special Requests   Final    BOTTLES DRAWN AEROBIC AND ANAEROBIC Blood Culture adequate volume Performed at [facility] [address], [place], [place] [address]    Culture   Final    NO GROWTH 1 DAY Performed at [facility], [place] [address] [place], [place] [address]    Report Status PENDING  Incomplete         Radiology Studies: CT Abdomen Pelvis W Contrast  Result Date: 01/28/2019 CLINICAL DATA:  Fall right flank hematoma EXAM: CT ABDOMEN AND PELVIS WITH CONTRAST TECHNIQUE: Multidetector CT imaging of the abdomen and pelvis was performed using the standard protocol following bolus administration of intravenous contrast. CONTRAST:  150m OMNIPAQUE IOHEXOL 300 MG/ML  SOLN COMPARISON:  July 27, 2018 FINDINGS: Lower chest: The visualized heart size within normal limits. No pericardial fluid/thickening. No hiatal hernia. Streaky atelectasis seen at both lung bases. Hepatobiliary: Again noted is hepatomegaly with a slightly nodular liver contour, consistent with cirrhosis. There is a low-density lesion in the posterior right liver lobe measuring 6.2 cm, unchanged from the prior CT. The gallbladder appears to be fluid-filled and mildly distended with a small amount of pericholecystic fluid and fat stranding  changes. No definite layering gallstones however are present. No biliary ductal dilatation. Pancreas: Unremarkable. No pancreatic ductal dilatation or surrounding inflammatory changes. Spleen: Normal in size without focal abnormality. Adrenals/Urinary Tract: Both adrenal glands appear normal. The kidneys and collecting system appear normal without evidence of urinary tract calculus or hydronephrosis. Bladder is unremarkable. Stomach/Bowel: The stomach, small bowel, and colon are normal in appearance. No inflammatory changes, wall thickening, or obstructive findings.Scattered colonic diverticula are noted without diverticulitis. The appendix is unremarkable. Vascular/Lymphatic: There are no enlarged mesenteric, retroperitoneal, or pelvic lymph nodes. A small amount of perihepatic ascites is noted. Scattered aortic atherosclerotic calcifications are seen without aneurysmal dilatation. Reproductive: The prostate contains radiation prostate seeds. Other: No evidence of abdominal wall mass or hernia. Musculoskeletal: No acute or significant osseous findings. IMPRESSION: Mildly distended gallbladder with a small amount of pericholecystic fluid and surrounding inflammatory changes. This could be due to cholecystitis. If further evaluation is required would recommend right upper quadrant ultrasound Hepatomegaly and findings suggestive of cirrhosis. Small perihepatic ascites Aortic Atherosclerosis (ICD10-I70.0). Unchanged 6 cm hepatic cyst. Electronically Signed   By: BPrudencio PairM.D.   On: 01/28/2019 23:23   DG Chest Port 1 View  Result Date:  01/28/2019 CLINICAL DATA:  Shortness of breath, diagnosed with COVID-19 on 01/03/2019, increased fatigue, jaundice, cirrhosis EXAM: PORTABLE CHEST 1 VIEW COMPARISON:  Portable exam 1759 hours compared to 02/19/2018 FINDINGS: Normal heart size, mediastinal contours, and pulmonary vascularity. Atherosclerotic calcification aorta. Mild LEFT basilar atelectasis. Lungs otherwise  clear. No pulmonary infiltrate, pleural effusion or pneumothorax. Multilevel endplate spur formation thoracic spine. IMPRESSION: Mild LEFT basilar atelectasis. Electronically Signed   By: Lavonia Dana M.D.   On: 01/28/2019 18:17   ECHOCARDIOGRAM COMPLETE  Result Date: 01/29/2019   ECHOCARDIOGRAM REPORT   Patient Name:   IGNACE MANDIGO Date of Exam: 01/29/2019 Medical Rec #:  [id number]          Height:       74.0 in Accession #:    [id number]         Weight:       235.0 lb Date of Birth:  08-23-1945          BSA:          2.33 m Patient Age:    10 years           BP:           134/68 mmHg Patient Gender: M                  HR:           117 bpm. Exam Location:  Inpatient Procedure: 2D Echo and Intracardiac Opacification Agent Indications:    Elevated troponin; I10 Hypertension  History:        Patient has no prior history of Echocardiogram examinations.                 Signs/Symptoms:Altered Mental Status. Covid19.  Sonographer:    Merrie Roof RDCS Referring Phys: [place]  1. Left ventricular ejection fraction, by visual estimation, is 60 to 65%. The left ventricle has normal function. There is no left ventricular hypertrophy.  2. Definity contrast agent was given IV to delineate the left ventricular endocardial borders.  3. Left ventricular diastolic parameters are consistent with Grade I diastolic dysfunction (impaired relaxation).  4. The left ventricle has no regional wall motion abnormalities.  5. Global right ventricle has normal systolic function.The right ventricular size is normal. No increase in right ventricular wall thickness.  6. Left atrial size was normal.  7. Right atrial size was normal.  8. The mitral valve is normal in structure. No evidence of mitral valve regurgitation. No evidence of mitral stenosis.  9. The tricuspid valve is normal in structure. 10. The aortic valve is normal in structure. Aortic valve regurgitation is not visualized. No evidence of aortic valve  sclerosis or stenosis. 11. The pulmonic valve was normal in structure. Pulmonic valve regurgitation is not visualized. 12. The inferior vena cava is normal in size with greater than 50% respiratory variability, suggesting right atrial pressure of 3 mmHg. FINDINGS  Left Ventricle: Left ventricular ejection fraction, by visual estimation, is 60 to 65%. The left ventricle has normal function. Definity contrast agent was given IV to delineate the left ventricular endocardial borders. The left ventricle has no regional wall motion abnormalities. There is no left ventricular hypertrophy. Left ventricular diastolic parameters are consistent with Grade I diastolic dysfunction (impaired relaxation). Normal left atrial pressure. Right Ventricle: The right ventricular size is normal. No increase in right ventricular wall thickness. Global RV systolic function is has normal systolic function. Left Atrium: Left atrial size was normal in size.  Right Atrium: Right atrial size was normal in size Pericardium: There is no evidence of pericardial effusion. Mitral Valve: The mitral valve is normal in structure. No evidence of mitral valve regurgitation. No evidence of mitral valve stenosis by observation. Tricuspid Valve: The tricuspid valve is normal in structure. Tricuspid valve regurgitation is not demonstrated. Aortic Valve: The aortic valve is normal in structure. Aortic valve regurgitation is not visualized. The aortic valve is structurally normal, with no evidence of sclerosis or stenosis. Pulmonic Valve: The pulmonic valve was normal in structure. Pulmonic valve regurgitation is not visualized. Pulmonic regurgitation is not visualized. Aorta: The aortic root, ascending aorta and aortic arch are all structurally normal, with no evidence of dilitation or obstruction. Venous: The inferior vena cava is normal in size with greater than 50% respiratory variability, suggesting right atrial pressure of 3 mmHg. IAS/Shunts: No atrial  level shunt detected by color flow Doppler. There is no evidence of a patent foramen ovale. No ventricular septal defect is seen or detected. There is no evidence of an atrial septal defect.  LEFT VENTRICLE PLAX 2D LVIDd:         4.90 cm LVIDs:         3.20 cm LV PW:         1.10 cm LV IVS:        1.10 cm LVOT diam:     1.90 cm LV SV:         72 ml LV SV Index:   30.16 LVOT Area:     2.84 cm  LEFT ATRIUM           Index LA diam:      4.10 cm 1.76 cm/m LA Vol (A2C): 23.7 ml 10.18 ml/m LA Vol (A4C): 53.9 ml 23.16 ml/m  AORTIC VALVE LVOT Vmax:   150.00 cm/s LVOT Vmean:  7010.000 cm/s  SHUNTS Systemic Diam: 1.90 cm  Candee Furbish MD Electronically signed by Candee Furbish MD Signature Date/Time: 01/29/2019/3:45:26 PM    Final    US Abdomen Limited RUQ  Result Date: 01/29/2019 CLINICAL DATA:  Jaundice EXAM: ULTRASOUND ABDOMEN LIMITED RIGHT UPPER QUADRANT COMPARISON:  None. FINDINGS: Gallbladder: Layering sludge/stones seen within gallbladder there is a edematous mildly prominent gallbladder wall measuring 3.1 mm. No sonographic Murphy sign. Common bile duct: Diameter: 3.7 mm Liver: Increased echotexture seen throughout. There is an anechoic cyst within the right liver lobe measuring 7.3 x 5.7 x 5.1 cm. The main portal vein appears patent. Other: None. IMPRESSION: Layering sludge/stones with mild gallbladder wall thickening. This could be due to mild acute cholecystitis. Electronically Signed   By: Prudencio Pair M.D.   On: 01/29/2019 01:55        Scheduled Meds: . folic acid  1 mg Oral Daily  . insulin aspart  0-9 Units Subcutaneous Q4H  . insulin glargine  15 Units Subcutaneous Daily  . lactulose  20 g Oral BID  . LORazepam  0-4 mg Intravenous Q6H   Followed by  . [START ON 01/31/2019] LORazepam  0-4 mg Intravenous Q12H  . multivitamin with minerals  1 tablet Oral Daily  . pantoprazole  40 mg Oral BID  . phytonadione  5 mg Oral Daily  . sodium chloride flush  3 mL Intravenous Q12H  . thiamine  100  mg Oral Daily   Or  . thiamine  100 mg Intravenous Daily   Continuous Infusions: . sodium chloride    . piperacillin-tazobactam (ZOSYN)  IV 3.375 g (01/30/19 0508)  LOS: 1 day    Time spent: 36 minutes spent on chart review, discussion with nursing staff, consultants, updating family and interview/physical exam; more than 50% of that time was spent in counseling and/or coordination of care.    Daurice Ovando J [place] DO [facility] 01/30/2019, 12:25 PM

## 2019-01-30 NOTE — Progress Notes (Addendum)
Daily Rounding Note  01/30/2019, 9:32 AM  LOS: 1 day   SUBJECTIVE:   Chief complaint:   Decompensated cirrhosis.  Question cholecystitis. Patient more alert" with it" this morning.  Not currently having abdominal pain but when he does it is in the right upper quadrant and left mid abdomen.  No nausea.  Tolerating solid food but did not eat a lot which has been the case at home as well. One brown stool last night, patient complains of recent constipation.  OBJECTIVE:         Vital signs in last 24 hours:    Temp:  [97.6 F (36.4 C)-97.9 F (36.6 C)] 97.7 F (36.5 C) (12/27 0422) Pulse Rate:  [91-111] 95 (12/27 0422) Resp:  [18-27] 20 (12/27 0422) BP: (115-165)/(58-78) 120/67 (12/27 0422) SpO2:  [92 %-99 %] 98 % (12/27 0422) Weight:  [110.7 kg] 110.7 kg (12/27 0422) Last BM Date: 01/29/19 Filed Weights   01/28/19 1729 01/30/19 0422  Weight: 106.6 kg 110.7 kg   General: Alert.  Appropriate.  Looks moderately unwell but not toxic Heart: RRR. Chest: Clear bilaterally.  No labored breathing.  Slight cough. Abdomen: Obese, soft, protuberant.  Tenderness without guarding in the right upper quadrant and left abdomen.  I was unable to really listen to his bowel sounds due to the over riding sound of the negative pressure machine in the room. Extremities: No CCE. Neuro/Psych: Alert.  Oriented to [facility].  When asked what year it was he kept repeating a ZIP Code, then started with 19..., Finally answer 2020.  Follows commands.  Moves all 4 limbs.  No asterixis.  Intake/Output from previous day: 12/26 0701 - 12/27 0700 In: 2302.4 [P.O.:480; I.V.:1621.9; IV Piggyback:200.5] Out: 500 [Urine:500]  Intake/Output this shift: No intake/output data recorded.  Lab Results: Recent Labs    01/29/19 0500 01/29/19 0626 01/30/19 0332  WBC 15.8* 12.5* 21.5*  HGB 13.7 13.4 11.9*  HCT 38.9* 38.9* 35.1*  PLT 151 133* 125*    BMET Recent Labs    01/28/19 1833 01/29/19 0626 01/30/19 0332  NA 124* 128* 131*  K 4.7 3.8 4.5  CL 86* 88* 89*  CO2 24 26 23   GLUCOSE 514* 300* 272*  BUN 29* 28* 31*  CREATININE 1.15 1.04 0.72  CALCIUM 8.3* 8.2* 7.5*   LFT Recent Labs    01/28/19 1833 01/29/19 0626 01/30/19 0332  PROT 6.0* 6.1* 5.9*  ALBUMIN 2.1* 2.1* 2.0*  AST 46* 51* 56*  ALT 34 37 31  ALKPHOS 316* 301* 253*  BILITOT 15.7* 15.6* 13.6*  BILIDIR 11.2*  --   --   IBILI 4.5*  --   --    PT/INR Recent Labs    01/29/19 0500 01/30/19 0332  LABPROT 15.6* 15.8*  INR 1.3* 1.3*   Hepatitis Panel Recent Labs    01/29/19 0500  HEPBSAG NON REACTIVE  HCVAB NON REACTIVE  HEPAIGM NON REACTIVE  HEPBIGM NON REACTIVE    Studies/Results: CT Abdomen Pelvis W Contrast  Result Date: 01/28/2019 CLINICAL DATA:  Fall right flank hematoma EXAM: CT ABDOMEN AND PELVIS WITH CONTRAST TECHNIQUE: Multidetector CT imaging of the abdomen and pelvis was performed using the standard protocol following bolus administration of intravenous contrast. CONTRAST:  116m OMNIPAQUE IOHEXOL 300 MG/ML  SOLN COMPARISON:  July 27, 2018 FINDINGS: Lower chest: The visualized heart size within normal limits. No pericardial fluid/thickening. No hiatal hernia. Streaky atelectasis seen at both lung bases. Hepatobiliary: Again  noted is hepatomegaly with a slightly nodular liver contour, consistent with cirrhosis. There is a low-density lesion in the posterior right liver lobe measuring 6.2 cm, unchanged from the prior CT. The gallbladder appears to be fluid-filled and mildly distended with a small amount of pericholecystic fluid and fat stranding changes. No definite layering gallstones however are present. No biliary ductal dilatation. Pancreas: Unremarkable. No pancreatic ductal dilatation or surrounding inflammatory changes. Spleen: Normal in size without focal abnormality. Adrenals/Urinary Tract: Both adrenal glands appear normal. The kidneys  and collecting system appear normal without evidence of urinary tract calculus or hydronephrosis. Bladder is unremarkable. Stomach/Bowel: The stomach, small bowel, and colon are normal in appearance. No inflammatory changes, wall thickening, or obstructive findings.Scattered colonic diverticula are noted without diverticulitis. The appendix is unremarkable. Vascular/Lymphatic: There are no enlarged mesenteric, retroperitoneal, or pelvic lymph nodes. A small amount of perihepatic ascites is noted. Scattered aortic atherosclerotic calcifications are seen without aneurysmal dilatation. Reproductive: The prostate contains radiation prostate seeds. Other: No evidence of abdominal wall mass or hernia. Musculoskeletal: No acute or significant osseous findings. IMPRESSION: Mildly distended gallbladder with a small amount of pericholecystic fluid and surrounding inflammatory changes. This could be due to cholecystitis. If further evaluation is required would recommend right upper quadrant ultrasound Hepatomegaly and findings suggestive of cirrhosis. Small perihepatic ascites Aortic Atherosclerosis (ICD10-I70.0). Unchanged 6 cm hepatic cyst. Electronically Signed   By: Prudencio Pair M.D.   On: 01/28/2019 23:23   DG Chest Port 1 View  Result Date: 01/28/2019 CLINICAL DATA:  Shortness of breath, diagnosed with COVID-19 on 01/03/2019, increased fatigue, jaundice, cirrhosis EXAM: PORTABLE CHEST 1 VIEW COMPARISON:  Portable exam 1759 hours compared to 02/19/2018 FINDINGS: Normal heart size, mediastinal contours, and pulmonary vascularity. Atherosclerotic calcification aorta. Mild LEFT basilar atelectasis. Lungs otherwise clear. No pulmonary infiltrate, pleural effusion or pneumothorax. Multilevel endplate spur formation thoracic spine. IMPRESSION: Mild LEFT basilar atelectasis. Electronically Signed   By: Lavonia Dana M.D.   On: 01/28/2019 18:17   ECHOCARDIOGRAM COMPLETE  Result Date: 01/29/2019   ECHOCARDIOGRAM REPORT    Patient Name:   ALPHUS ZECK Date of Exam: 01/29/2019 Medical Rec #:  [id number]          Height:       74.0 in Accession #:    [id number]         Weight:       235.0 lb Date of Birth:  Nov 12, 1945          BSA:          2.33 m Patient Age:    73 years           BP:           134/68 mmHg Patient Gender: M                  HR:           117 bpm. Exam Location:  Inpatient Procedure: 2D Echo and Intracardiac Opacification Agent Indications:    Elevated troponin; I10 Hypertension  History:        Patient has no prior history of Echocardiogram examinations.                 Signs/Symptoms:Altered Mental Status. Covid19.  Sonographer:    Merrie Roof RDCS Referring Phys: Conception  1. Left ventricular ejection fraction, by visual estimation, is 60 to 65%. The left ventricle has normal function. There is no left ventricular hypertrophy.  2. Definity contrast agent  was given IV to delineate the left ventricular endocardial borders.  3. Left ventricular diastolic parameters are consistent with Grade I diastolic dysfunction (impaired relaxation).  4. The left ventricle has no regional wall motion abnormalities.  5. Global right ventricle has normal systolic function.The right ventricular size is normal. No increase in right ventricular wall thickness.  6. Left atrial size was normal.  7. Right atrial size was normal.  8. The mitral valve is normal in structure. No evidence of mitral valve regurgitation. No evidence of mitral stenosis.  9. The tricuspid valve is normal in structure. 10. The aortic valve is normal in structure. Aortic valve regurgitation is not visualized. No evidence of aortic valve sclerosis or stenosis. 11. The pulmonic valve was normal in structure. Pulmonic valve regurgitation is not visualized. 12. The inferior vena cava is normal in size with greater than 50% respiratory variability, suggesting right atrial pressure of 3 mmHg. FINDINGS  Left Ventricle: Left ventricular ejection  fraction, by visual estimation, is 60 to 65%. The left ventricle has normal function. Definity contrast agent was given IV to delineate the left ventricular endocardial borders. The left ventricle has no regional wall motion abnormalities. There is no left ventricular hypertrophy. Left ventricular diastolic parameters are consistent with Grade I diastolic dysfunction (impaired relaxation). Normal left atrial pressure. Right Ventricle: The right ventricular size is normal. No increase in right ventricular wall thickness. Global RV systolic function is has normal systolic function. Left Atrium: Left atrial size was normal in size. Right Atrium: Right atrial size was normal in size Pericardium: There is no evidence of pericardial effusion. Mitral Valve: The mitral valve is normal in structure. No evidence of mitral valve regurgitation. No evidence of mitral valve stenosis by observation. Tricuspid Valve: The tricuspid valve is normal in structure. Tricuspid valve regurgitation is not demonstrated. Aortic Valve: The aortic valve is normal in structure. Aortic valve regurgitation is not visualized. The aortic valve is structurally normal, with no evidence of sclerosis or stenosis. Pulmonic Valve: The pulmonic valve was normal in structure. Pulmonic valve regurgitation is not visualized. Pulmonic regurgitation is not visualized. Aorta: The aortic root, ascending aorta and aortic arch are all structurally normal, with no evidence of dilitation or obstruction. Venous: The inferior vena cava is normal in size with greater than 50% respiratory variability, suggesting right atrial pressure of 3 mmHg. IAS/Shunts: No atrial level shunt detected by color flow Doppler. There is no evidence of a patent foramen ovale. No ventricular septal defect is seen or detected. There is no evidence of an atrial septal defect.  LEFT VENTRICLE PLAX 2D LVIDd:         4.90 cm LVIDs:         3.20 cm LV PW:         1.10 cm LV IVS:        1.10 cm  LVOT diam:     1.90 cm LV SV:         72 ml LV SV Index:   30.16 LVOT Area:     2.84 cm  LEFT ATRIUM           Index LA diam:      4.10 cm 1.76 cm/m LA Vol (A2C): 23.7 ml 10.18 ml/m LA Vol (A4C): 53.9 ml 23.16 ml/m  AORTIC VALVE LVOT Vmax:   150.00 cm/s LVOT Vmean:  7010.000 cm/s  SHUNTS Systemic Diam: 1.90 cm  Candee Furbish MD Electronically signed by Candee Furbish MD Signature Date/Time: 01/29/2019/3:45:26 PM  Final    US Abdomen Limited RUQ  Result Date: 01/29/2019 CLINICAL DATA:  Jaundice EXAM: ULTRASOUND ABDOMEN LIMITED RIGHT UPPER QUADRANT COMPARISON:  None. FINDINGS: Gallbladder: Layering sludge/stones seen within gallbladder there is a edematous mildly prominent gallbladder wall measuring 3.1 mm. No sonographic Murphy sign. Common bile duct: Diameter: 3.7 mm Liver: Increased echotexture seen throughout. There is an anechoic cyst within the right liver lobe measuring 7.3 x 5.7 x 5.1 cm. The main portal vein appears patent. Other: None. IMPRESSION: Layering sludge/stones with mild gallbladder wall thickening. This could be due to mild acute cholecystitis. Electronically Signed   By: Prudencio Pair M.D.   On: 01/29/2019 01:55   Scheduled Meds: . bisacodyl  10 mg Rectal Once  . folic acid  1 mg Oral Daily  . insulin aspart  0-9 Units Subcutaneous Q4H  . insulin glargine  15 Units Subcutaneous Daily  . lactulose  20 g Oral BID  . LORazepam  0-4 mg Intravenous Q6H   Followed by  . [START ON 01/31/2019] LORazepam  0-4 mg Intravenous Q12H  . multivitamin with minerals  1 tablet Oral Daily  . pantoprazole  40 mg Oral BID  . phytonadione  5 mg Oral Daily  . sodium chloride flush  3 mL Intravenous Q12H  . thiamine  100 mg Oral Daily   Or  . thiamine  100 mg Intravenous Daily   Continuous Infusions: . sodium chloride    . piperacillin-tazobactam (ZOSYN)  IV 3.375 g (01/30/19 0508)   PRN Meds:.sodium chloride, hydrALAZINE, HYDROmorphone (DILAUDID) injection, LORazepam **OR** LORazepam, sodium  chloride flush, traMADol   ASSESMENT:   *   Acute cholecystitis?  Surgery not convince of cholecystitis.  Based on Child's class C surgery feels he is not a candidate for surgical intervention.  HIDA scan unlikely to be helpful given the level of jaundice.  Surgery also wondering if he is a candidate for cholecystostomy tube. Jaundice may be due solely to decompensated cirrhosis, not cholecystitis but decompensated liver disease does not explain the recent right upper quadrant abdominal pain.   WBCs rising 15.4 >> 12.5 >> 21.5.  Blood clxs negative to date.  Zosyn day 2  t bili, alk phos improved.  AST minimally higher.      *    Cirrhosis of liver.  decompensated.  Small peri-hepatic ascites.  Ongoing heavy alcohol abuse.  No varices at EGD in 10/2015.  Large hepatic cyst.   Child's score of 10, Class C INR slightly elevated at 1.3  *   Thrombocytopenia.  Improved c/w 2 months ago.  Platelets 125.     *   Uncontrolled DM 2.     *   Covid 19 + 01/03/19.  Treated w course of Prednisone at home.   Per Dr Hatcher/ID: "don't retest; don't isolate; treat as immune".   However he is on full isolation PPE precautions.  *  Slight coagulopathy, INR stable at 1.3. day 2/3 oral Vit K.     *   Hyponatremia.  Improved.  124 >> 131.    *    AMS.  Though ammonia is just 31, suspect HE.  BID oral lactulose in place.   Watch for alcohol withdrawal, CIWA protocol in place.  *   Grade 1 DD and LVEF 60 to 65% on 2D echo 12/26.     PLAN   *    Continue lactulose, carb mod diet. Dr. Ninfa Linden from surgery following the patient.  *    Need  to discontinue the PPE precautions.   Azucena Freed  01/30/2019, 9:32 AM Phone [phone]     Attending physician's note   I have taken an interval history, reviewed the chart and examined the patient. I agree with the Advanced Practitioner's note, impression and recommendations.   TB 13.6, Alb 2.0, Cr 0.7, INR 1.2 WBC count increasing  21K   Decompensated liver cirrhosis with portal HTN and liver failure.  MELD-Na 24. Acute decompensation d/t recent COVID-19/steroids/uncontrolled DM/ETOH.  Doubt cholecystitis but possible. No choledocholithiasis (CBD Nl on US/CT).  GB wall thickening could be d/t perihepatic ascites. On Zosyn.  He does feel better.   ?Etiology of increasing WBC count.  Hepatic encephalopathy.  COVID-19 01/03/19. Treated with prednisone.  Per ID- prednisone at home. "don't retest; don't isolate; treat as immune".  Plan: -Supportive treatment. -Trend CBC, LFTs, PT/INR. -May need MRCP in the next few days depending upon clinical course/labs. -Watch for alcohol withdrawal. -Would also hold off on steroids for now.  Unclear etiology/did have recent steroids/uncontrolled DM. -Agree with lactulose, vitamin K. -Salt restricted, normal protein diet. -IV albumin -Dr. Silverio Decamp taking over service tomorrow.   Carmell Austria, MD Velora Heckler Fabienne Bruns [phone].

## 2019-01-30 NOTE — Progress Notes (Signed)
Subjective/Chief Complaint: He reports less RUQ abdominal pain   Objective: Vital signs in last 24 hours: Temp:  [97.6 F (36.4 C)-97.9 F (36.6 C)] 97.7 F (36.5 C) (12/27 0422) Pulse Rate:  [91-111] 95 (12/27 0422) Resp:  [18-27] 20 (12/27 0422) BP: (115-165)/(58-78) 120/67 (12/27 0422) SpO2:  [92 %-99 %] 98 % (12/27 0422) Weight:  [110.7 kg] 110.7 kg (12/27 0422) Last BM Date: 01/29/19  Intake/Output from previous day: 12/26 0701 - 12/27 0700 In: 2302.4 [P.O.:480; I.V.:1621.9; IV Piggyback:200.5] Out: 500 [Urine:500] Intake/Output this shift: No intake/output data recorded.  Exam: Awake and alert Less restless Abdomen obese, less tender in the RUQ  Lab Results:  Recent Labs    01/29/19 0626 01/30/19 0332  WBC 12.5* 21.5*  HGB 13.4 11.9*  HCT 38.9* 35.1*  PLT 133* 125*   BMET Recent Labs    01/29/19 0626 01/30/19 0332  NA 128* 131*  K 3.8 4.5  CL 88* 89*  CO2 26 23  GLUCOSE 300* 272*  BUN 28* 31*  CREATININE 1.04 0.72  CALCIUM 8.2* 7.5*   PT/INR Recent Labs    01/29/19 0500 01/30/19 0332  LABPROT 15.6* 15.8*  INR 1.3* 1.3*   ABG No results for input(s): PHART, HCO3 in the last 72 hours.  Invalid input(s): PCO2, PO2  Studies/Results: CT Abdomen Pelvis W Contrast  Result Date: 01/28/2019 CLINICAL DATA:  Fall right flank hematoma EXAM: CT ABDOMEN AND PELVIS WITH CONTRAST TECHNIQUE: Multidetector CT imaging of the abdomen and pelvis was performed using the standard protocol following bolus administration of intravenous contrast. CONTRAST:  151mL OMNIPAQUE IOHEXOL 300 MG/ML  SOLN COMPARISON:  July 27, 2018 FINDINGS: Lower chest: The visualized heart size within normal limits. No pericardial fluid/thickening. No hiatal hernia. Streaky atelectasis seen at both lung bases. Hepatobiliary: Again noted is hepatomegaly with a slightly nodular liver contour, consistent with cirrhosis. There is a low-density lesion in the posterior right liver lobe  measuring 6.2 cm, unchanged from the prior CT. The gallbladder appears to be fluid-filled and mildly distended with a small amount of pericholecystic fluid and fat stranding changes. No definite layering gallstones however are present. No biliary ductal dilatation. Pancreas: Unremarkable. No pancreatic ductal dilatation or surrounding inflammatory changes. Spleen: Normal in size without focal abnormality. Adrenals/Urinary Tract: Both adrenal glands appear normal. The kidneys and collecting system appear normal without evidence of urinary tract calculus or hydronephrosis. Bladder is unremarkable. Stomach/Bowel: The stomach, small bowel, and colon are normal in appearance. No inflammatory changes, wall thickening, or obstructive findings.Scattered colonic diverticula are noted without diverticulitis. The appendix is unremarkable. Vascular/Lymphatic: There are no enlarged mesenteric, retroperitoneal, or pelvic lymph nodes. A small amount of perihepatic ascites is noted. Scattered aortic atherosclerotic calcifications are seen without aneurysmal dilatation. Reproductive: The prostate contains radiation prostate seeds. Other: No evidence of abdominal wall mass or hernia. Musculoskeletal: No acute or significant osseous findings. IMPRESSION: Mildly distended gallbladder with a small amount of pericholecystic fluid and surrounding inflammatory changes. This could be due to cholecystitis. If further evaluation is required would recommend right upper quadrant ultrasound Hepatomegaly and findings suggestive of cirrhosis. Small perihepatic ascites Aortic Atherosclerosis (ICD10-I70.0). Unchanged 6 cm hepatic cyst. Electronically Signed   By: Prudencio Pair M.D.   On: 01/28/2019 23:23   DG Chest Port 1 View  Result Date: 01/28/2019 CLINICAL DATA:  Shortness of breath, diagnosed with COVID-19 on 01/03/2019, increased fatigue, jaundice, cirrhosis EXAM: PORTABLE CHEST 1 VIEW COMPARISON:  Portable exam 1759 hours compared to  02/19/2018 FINDINGS: Normal  heart size, mediastinal contours, and pulmonary vascularity. Atherosclerotic calcification aorta. Mild LEFT basilar atelectasis. Lungs otherwise clear. No pulmonary infiltrate, pleural effusion or pneumothorax. Multilevel endplate spur formation thoracic spine. IMPRESSION: Mild LEFT basilar atelectasis. Electronically Signed   By: Lavonia Dana M.D.   On: 01/28/2019 18:17   ECHOCARDIOGRAM COMPLETE  Result Date: 01/29/2019   ECHOCARDIOGRAM REPORT   Patient Name:   Patrick Kidd Date of Exam: 01/29/2019 Medical Rec #:  [id number]          Height:       74.0 in Accession #:    [id number]         Weight:       235.0 lb Date of Birth:  03-31-1945          BSA:          2.33 m Patient Age:    73 years           BP:           134/68 mmHg Patient Gender: M                  HR:           117 bpm. Exam Location:  Inpatient Procedure: 2D Echo and Intracardiac Opacification Agent Indications:    Elevated troponin; I10 Hypertension  History:        Patient has no prior history of Echocardiogram examinations.                 Signs/Symptoms:Altered Mental Status. Covid19.  Sonographer:    Merrie Roof RDCS Referring Phys: Cook  1. Left ventricular ejection fraction, by visual estimation, is 60 to 65%. The left ventricle has normal function. There is no left ventricular hypertrophy.  2. Definity contrast agent was given IV to delineate the left ventricular endocardial borders.  3. Left ventricular diastolic parameters are consistent with Grade I diastolic dysfunction (impaired relaxation).  4. The left ventricle has no regional wall motion abnormalities.  5. Global right ventricle has normal systolic function.The right ventricular size is normal. No increase in right ventricular wall thickness.  6. Left atrial size was normal.  7. Right atrial size was normal.  8. The mitral valve is normal in structure. No evidence of mitral valve regurgitation. No evidence of mitral  stenosis.  9. The tricuspid valve is normal in structure. 10. The aortic valve is normal in structure. Aortic valve regurgitation is not visualized. No evidence of aortic valve sclerosis or stenosis. 11. The pulmonic valve was normal in structure. Pulmonic valve regurgitation is not visualized. 12. The inferior vena cava is normal in size with greater than 50% respiratory variability, suggesting right atrial pressure of 3 mmHg. FINDINGS  Left Ventricle: Left ventricular ejection fraction, by visual estimation, is 60 to 65%. The left ventricle has normal function. Definity contrast agent was given IV to delineate the left ventricular endocardial borders. The left ventricle has no regional wall motion abnormalities. There is no left ventricular hypertrophy. Left ventricular diastolic parameters are consistent with Grade I diastolic dysfunction (impaired relaxation). Normal left atrial pressure. Right Ventricle: The right ventricular size is normal. No increase in right ventricular wall thickness. Global RV systolic function is has normal systolic function. Left Atrium: Left atrial size was normal in size. Right Atrium: Right atrial size was normal in size Pericardium: There is no evidence of pericardial effusion. Mitral Valve: The mitral valve is normal in structure. No evidence of mitral valve regurgitation. No  evidence of mitral valve stenosis by observation. Tricuspid Valve: The tricuspid valve is normal in structure. Tricuspid valve regurgitation is not demonstrated. Aortic Valve: The aortic valve is normal in structure. Aortic valve regurgitation is not visualized. The aortic valve is structurally normal, with no evidence of sclerosis or stenosis. Pulmonic Valve: The pulmonic valve was normal in structure. Pulmonic valve regurgitation is not visualized. Pulmonic regurgitation is not visualized. Aorta: The aortic root, ascending aorta and aortic arch are all structurally normal, with no evidence of dilitation or  obstruction. Venous: The inferior vena cava is normal in size with greater than 50% respiratory variability, suggesting right atrial pressure of 3 mmHg. IAS/Shunts: No atrial level shunt detected by color flow Doppler. There is no evidence of a patent foramen ovale. No ventricular septal defect is seen or detected. There is no evidence of an atrial septal defect.  LEFT VENTRICLE PLAX 2D LVIDd:         4.90 cm LVIDs:         3.20 cm LV PW:         1.10 cm LV IVS:        1.10 cm LVOT diam:     1.90 cm LV SV:         72 ml LV SV Index:   30.16 LVOT Area:     2.84 cm  LEFT ATRIUM           Index LA diam:      4.10 cm 1.76 cm/m LA Vol (A2C): 23.7 ml 10.18 ml/m LA Vol (A4C): 53.9 ml 23.16 ml/m  AORTIC VALVE LVOT Vmax:   150.00 cm/s LVOT Vmean:  7010.000 cm/s  SHUNTS Systemic Diam: 1.90 cm  Candee Furbish MD Electronically signed by Candee Furbish MD Signature Date/Time: 01/29/2019/3:45:26 PM    Final    US Abdomen Limited RUQ  Result Date: 01/29/2019 CLINICAL DATA:  Jaundice EXAM: ULTRASOUND ABDOMEN LIMITED RIGHT UPPER QUADRANT COMPARISON:  None. FINDINGS: Gallbladder: Layering sludge/stones seen within gallbladder there is a edematous mildly prominent gallbladder wall measuring 3.1 mm. No sonographic Murphy sign. Common bile duct: Diameter: 3.7 mm Liver: Increased echotexture seen throughout. There is an anechoic cyst within the right liver lobe measuring 7.3 x 5.7 x 5.1 cm. The main portal vein appears patent. Other: None. IMPRESSION: Layering sludge/stones with mild gallbladder wall thickening. This could be due to mild acute cholecystitis. Electronically Signed   By: Prudencio Pair M.D.   On: 01/29/2019 01:55    Anti-infectives: Anti-infectives (From admission, onward)   Start     Dose/Rate Route Frequency Ordered Stop   01/29/19 0700  piperacillin-tazobactam (ZOSYN) IVPB 3.375 g     3.375 g 12.5 mL/hr over 240 Minutes Intravenous Every 8 hours 01/29/19 0403     01/29/19 0030  piperacillin-tazobactam (ZOSYN)  IVPB 3.375 g     3.375 g 100 mL/hr over 30 Minutes Intravenous  Once 01/29/19 0019 01/29/19 0123      Assessment/Plan:  Cirrhosis, jaundice and gallstones  Again, difficult to say whether this is all secondary to his liver or there is some cholecystitis.  Clinically he is better but his wbc has increase. If it goes up further, will get IR's opinion regarding a perc chole as he is not a surgical candidate for a lap chole given his cirrhosis  LOS: 1 day    Coralie Keens 01/30/2019

## 2019-01-30 NOTE — Progress Notes (Signed)
Pharmacy Antibiotic Note  Patrick Kidd is a 73 y.o. male admitted on 01/28/2019 with intra-abdominal infection.  Pharmacy has been consulted for piperacillin/tazobactam dosing.  Pt PMH significant for cirrhosis. Admitted with abdominal pain.   Today, 01/30/19 -WBC 21.5, elevated and increasing -SCr 0.7, CrCL > 60 mL/min -Afebrile  Day #2 of IV antibiotics  Plan:  Continue piperacillin/tazobactam 3.375 g IV q8h EI  Renal function stable, pharmacy to sign off. Will follow dose peripherally. Please re-consult if needed   Height: 6\' 2"  (188 cm) Weight: 244 lb (110.7 kg) IBW/kg (Calculated) : 82.2  Temp (24hrs), Avg:97.7 F (36.5 C), Min:97.6 F (36.4 C), Max:97.9 F (36.6 C)  Recent Labs  Lab 01/28/19 1833 01/29/19 0500 01/29/19 0626 01/30/19 0332  WBC 15.4* 15.8* 12.5* 21.5*  CREATININE 1.15  --  1.04 0.72    Estimated Creatinine Clearance: 107.3 mL/min (by C-G formula based on SCr of 0.72 mg/dL).    No Known Allergies  Antimicrobials this admission: Piperacillin/tazobactam 12/27 >>   Dose adjustments this admission:  Microbiology results: 12/27 BCx:  Lenis Noon, PharmD 01/30/2019 11:06 AM

## 2019-01-31 ENCOUNTER — Inpatient Hospital Stay (HOSPITAL_COMMUNITY): Payer: Medicare Other

## 2019-01-31 DIAGNOSIS — D72828 Other elevated white blood cell count: Secondary | ICD-10-CM

## 2019-01-31 DIAGNOSIS — R17 Unspecified jaundice: Secondary | ICD-10-CM

## 2019-01-31 DIAGNOSIS — D72829 Elevated white blood cell count, unspecified: Secondary | ICD-10-CM

## 2019-01-31 DIAGNOSIS — R188 Other ascites: Secondary | ICD-10-CM

## 2019-01-31 LAB — COMPREHENSIVE METABOLIC PANEL
ALT: 37 U/L (ref 0–44)
AST: 63 U/L — ABNORMAL HIGH (ref 15–41)
Albumin: 2 g/dL — ABNORMAL LOW (ref 3.5–5.0)
Alkaline Phosphatase: 304 U/L — ABNORMAL HIGH (ref 38–126)
Anion gap: 15 (ref 5–15)
BUN: 32 mg/dL — ABNORMAL HIGH (ref 8–23)
CO2: 23 mmol/L (ref 22–32)
Calcium: 7.7 mg/dL — ABNORMAL LOW (ref 8.9–10.3)
Chloride: 94 mmol/L — ABNORMAL LOW (ref 98–111)
Creatinine, Ser: 0.97 mg/dL (ref 0.61–1.24)
GFR calc Af Amer: >60 mL/min (ref >60)
GFR calc non Af Amer: >60 mL/min (ref >60)
Glucose, Bld: 203 mg/dL — ABNORMAL HIGH (ref 70–99)
Potassium: 3.8 mmol/L (ref 3.5–5.1)
Sodium: 132 mmol/L — ABNORMAL LOW (ref 135–145)
Total Bilirubin: 12.3 mg/dL — ABNORMAL HIGH (ref 0.3–1.2)
Total Protein: 5.5 g/dL — ABNORMAL LOW (ref 6.5–8.1)

## 2019-01-31 LAB — CBC
HCT: 35.7 % — ABNORMAL LOW (ref 39.0–52.0)
Hemoglobin: 12.3 g/dL — ABNORMAL LOW (ref 13.0–17.0)
MCH: 33.9 pg (ref 26.0–34.0)
MCHC: 34.5 g/dL (ref 30.0–36.0)
MCV: 98.3 fL (ref 80.0–100.0)
Platelets: 106 10*3/uL — ABNORMAL LOW (ref 150–400)
RBC: 3.63 MIL/uL — ABNORMAL LOW (ref 4.22–5.81)
RDW: 13.9 % (ref 11.5–15.5)
WBC: 27.4 10*3/uL — ABNORMAL HIGH (ref 4.0–10.5)
nRBC: 0 % (ref 0.0–0.2)

## 2019-01-31 LAB — PROTEIN, PLEURAL OR PERITONEAL FLUID: Total protein, fluid: <3 g/dL

## 2019-01-31 LAB — BODY FLUID CELL COUNT WITH DIFFERENTIAL
Eos, Fluid: 0 %
Lymphs, Fluid: 2 %
Monocyte-Macrophage-Serous Fluid: 8 % — ABNORMAL LOW (ref 50–90)
Neutrophil Count, Fluid: 90 % — ABNORMAL HIGH (ref 0–25)
Total Nucleated Cell Count, Fluid: 50000 cu mm — ABNORMAL HIGH (ref 0–1000)

## 2019-01-31 LAB — PROTIME-INR
INR: 1.3 — ABNORMAL HIGH (ref 0.8–1.2)
Prothrombin Time: 16.4 seconds — ABNORMAL HIGH (ref 11.4–15.2)

## 2019-01-31 LAB — HEMOGLOBIN A1C
Hgb A1c MFr Bld: 8.9 % — ABNORMAL HIGH (ref 4.8–5.6)
Mean Plasma Glucose: 208.73 mg/dL

## 2019-01-31 LAB — ALBUMIN, PLEURAL OR PERITONEAL FLUID: Albumin, Fluid: <1 g/dL

## 2019-01-31 LAB — GLUCOSE, CAPILLARY
Glucose-Capillary: 181 mg/dL — ABNORMAL HIGH (ref 70–99)
Glucose-Capillary: 194 mg/dL — ABNORMAL HIGH (ref 70–99)
Glucose-Capillary: 209 mg/dL — ABNORMAL HIGH (ref 70–99)
Glucose-Capillary: 218 mg/dL — ABNORMAL HIGH (ref 70–99)
Glucose-Capillary: 237 mg/dL — ABNORMAL HIGH (ref 70–99)
Glucose-Capillary: 237 mg/dL — ABNORMAL HIGH (ref 70–99)

## 2019-01-31 LAB — C-REACTIVE PROTEIN: CRP: 30 mg/dL — ABNORMAL HIGH (ref <1.0)

## 2019-01-31 LAB — PROCALCITONIN: Procalcitonin: 2.76 ng/mL

## 2019-01-31 LAB — D-DIMER, QUANTITATIVE: D-Dimer, Quant: 2.91 ug/mL-FEU — ABNORMAL HIGH (ref 0.00–0.50)

## 2019-01-31 MED ORDER — AMOXICILLIN-POT CLAVULANATE 875-125 MG PO TABS
1.0000 | ORAL_TABLET | Freq: Two times a day (BID) | ORAL | Status: DC
  Administered 2019-01-31: 1 via ORAL
  Filled 2019-01-31: qty 1

## 2019-01-31 MED ORDER — ALBUMIN HUMAN 25 % IV SOLN
100.0000 g | Freq: Once | INTRAVENOUS | Status: AC
  Administered 2019-01-31: 100 g via INTRAVENOUS
  Filled 2019-01-31: qty 400

## 2019-01-31 MED ORDER — GADOBUTROL 1 MMOL/ML IV SOLN
10.0000 mL | Freq: Once | INTRAVENOUS | Status: AC | PRN
  Administered 2019-01-31: 10 mL via INTRAVENOUS

## 2019-01-31 MED ORDER — ALBUMIN HUMAN 25 % IV SOLN
100.0000 g | Freq: Once | INTRAVENOUS | Status: DC
  Filled 2019-01-31: qty 400

## 2019-01-31 MED ORDER — INSULIN GLARGINE 100 UNIT/ML ~~LOC~~ SOLN
25.0000 [IU] | Freq: Every day | SUBCUTANEOUS | Status: DC
  Administered 2019-01-31 – 2019-02-09 (×10): 25 [IU] via SUBCUTANEOUS
  Filled 2019-01-31 (×10): qty 0.25

## 2019-01-31 MED ORDER — LIDOCAINE HCL 1 % IJ SOLN
INTRAMUSCULAR | Status: AC
  Filled 2019-01-31: qty 20

## 2019-01-31 NOTE — Progress Notes (Signed)
Pt's wife, Nakul Tuffy, updated vis phone regarding patient's care.

## 2019-01-31 NOTE — Progress Notes (Addendum)
[facility] Progress Note  CC:  Decompensated cirrhosis, jaundice and upper abdominal pain  Subjective: He complains of generalized discomfort from "being in the bed for 3 days". No specific abdominal pain. He complains of having a nonproductive cough. He is tolerating a heart healthy diet this am. He passed 2 brown BMs earlier this morning.   Objective:  Vital signs in last 24 hours: Temp:  [97.5 F (36.4 C)-98.6 F (37 C)] 97.6 F (36.4 C) (12/28 0514) Pulse Rate:  [92-106] 97 (12/28 0514) Resp:  [16-19] 18 (12/28 0514) BP: (119-140)/(61-81) 130/81 (12/28 0514) SpO2:  [93 %-96 %] 95 % (12/28 0514) Weight:  [112.6 kg] 112.6 kg (12/28 0500) Last BM Date: 01/30/19 General:   Alert 73 year old male in NAD. Heart: Heart rhythm intermittently irregular on exam, no murmur or rub. Pulm:  Breath sounds clear throughout, no crackles, wheezes or rhonchi. Abdomen: Distended but not tense, moderate amount of ascites, epigastric and RUQ tenderness, LLQ tenderness without rebound or guarding, + BS x 4 quads, no obvious HSM. Extremities:  Without edema. Sequential compression device in use.  Neurologic:  Alert and  oriented x 3, patient follows commands appropriately, no asterixis, speech is a slightly delayed but clear. Psych:  Alert and cooperative. Normal mood and affect. GU: condom catheter draining orange clear urine.   Intake/Output from previous day: 12/27 0701 - 12/28 0700 In: 359.4 [P.O.:50; I.V.:51.2; IV Piggyback:258.3] Out: 450 [Urine:450] Intake/Output this shift: No intake/output data recorded.  Lab Results: Recent Labs    01/29/19 0626 01/30/19 0332 01/31/19 0600  WBC 12.5* 21.5* 27.4*  HGB 13.4 11.9* 12.3*  HCT 38.9* 35.1* 35.7*  PLT 133* 125* 106*   BMET Recent Labs    01/29/19 0626 01/30/19 0332 01/31/19 0600  NA 128* 131* 132*  K 3.8 4.5 3.8  CL 88* 89* 94*  CO2 26 23 23   GLUCOSE 300* 272* 203*  BUN 28* 31* 32*  CREATININE 1.04 0.72  0.97  CALCIUM 8.2* 7.5* 7.7*   LFT Recent Labs    01/28/19 1833 01/31/19 0600  PROT 6.0* 5.5*  ALBUMIN 2.1* 2.0*  AST 46* 63*  ALT 34 37  ALKPHOS 316* 304*  BILITOT 15.7* 12.3*  BILIDIR 11.2*  --   IBILI 4.5*  --    PT/INR Recent Labs    01/30/19 0332 01/31/19 0600  LABPROT 15.8* 16.4*  INR 1.3* 1.3*   Hepatitis Panel Recent Labs    01/29/19 0500  HEPBSAG NON REACTIVE  HCVAB NON REACTIVE  HEPAIGM NON REACTIVE  HEPBIGM NON REACTIVE    ECHOCARDIOGRAM COMPLETE  Result Date: 01/29/2019   ECHOCARDIOGRAM REPORT   Patient Name:   Patrick Kidd Date of Exam: 01/29/2019 Medical Rec #:  [id number]          Height:       74.0 in Accession #:    [id number]         Weight:       235.0 lb Date of Birth:  09-25-1945          BSA:          2.33 m Patient Age:    73 years           BP:           134/68 mmHg Patient Gender: M                  HR:  117 bpm. Exam Location:  Inpatient Procedure: 2D Echo and Intracardiac Opacification Agent Indications:    Elevated troponin; I10 Hypertension  History:        Patient has no prior history of Echocardiogram examinations.                 Signs/Symptoms:Altered Mental Status. Covid19.  Sonographer:    Merrie Roof RDCS Referring Phys: Lares  1. Left ventricular ejection fraction, by visual estimation, is 60 to 65%. The left ventricle has normal function. There is no left ventricular hypertrophy.  2. Definity contrast agent was given IV to delineate the left ventricular endocardial borders.  3. Left ventricular diastolic parameters are consistent with Grade I diastolic dysfunction (impaired relaxation).  4. The left ventricle has no regional wall motion abnormalities.  5. Global right ventricle has normal systolic function.The right ventricular size is normal. No increase in right ventricular wall thickness.  6. Left atrial size was normal.  7. Right atrial size was normal.  8. The mitral valve is normal in structure. No  evidence of mitral valve regurgitation. No evidence of mitral stenosis.  9. The tricuspid valve is normal in structure. 10. The aortic valve is normal in structure. Aortic valve regurgitation is not visualized. No evidence of aortic valve sclerosis or stenosis. 11. The pulmonic valve was normal in structure. Pulmonic valve regurgitation is not visualized. 12. The inferior vena cava is normal in size with greater than 50% respiratory variability, suggesting right atrial pressure of 3 mmHg. FINDINGS  Left Ventricle: Left ventricular ejection fraction, by visual estimation, is 60 to 65%. The left ventricle has normal function. Definity contrast agent was given IV to delineate the left ventricular endocardial borders. The left ventricle has no regional wall motion abnormalities. There is no left ventricular hypertrophy. Left ventricular diastolic parameters are consistent with Grade I diastolic dysfunction (impaired relaxation). Normal left atrial pressure. Right Ventricle: The right ventricular size is normal. No increase in right ventricular wall thickness. Global RV systolic function is has normal systolic function. Left Atrium: Left atrial size was normal in size. Right Atrium: Right atrial size was normal in size Pericardium: There is no evidence of pericardial effusion. Mitral Valve: The mitral valve is normal in structure. No evidence of mitral valve regurgitation. No evidence of mitral valve stenosis by observation. Tricuspid Valve: The tricuspid valve is normal in structure. Tricuspid valve regurgitation is not demonstrated. Aortic Valve: The aortic valve is normal in structure. Aortic valve regurgitation is not visualized. The aortic valve is structurally normal, with no evidence of sclerosis or stenosis. Pulmonic Valve: The pulmonic valve was normal in structure. Pulmonic valve regurgitation is not visualized. Pulmonic regurgitation is not visualized. Aorta: The aortic root, ascending aorta and aortic arch  are all structurally normal, with no evidence of dilitation or obstruction. Venous: The inferior vena cava is normal in size with greater than 50% respiratory variability, suggesting right atrial pressure of 3 mmHg. IAS/Shunts: No atrial level shunt detected by color flow Doppler. There is no evidence of a patent foramen ovale. No ventricular septal defect is seen or detected. There is no evidence of an atrial septal defect.  LEFT VENTRICLE PLAX 2D LVIDd:         4.90 cm LVIDs:         3.20 cm LV PW:         1.10 cm LV IVS:        1.10 cm LVOT diam:     1.90 cm  LV SV:         72 ml LV SV Index:   30.16 LVOT Area:     2.84 cm  LEFT ATRIUM           Index LA diam:      4.10 cm 1.76 cm/m LA Vol (A2C): 23.7 ml 10.18 ml/m LA Vol (A4C): 53.9 ml 23.16 ml/m  AORTIC VALVE LVOT Vmax:   150.00 cm/s LVOT Vmean:  7010.000 cm/s  SHUNTS Systemic Diam: 1.90 cm  Candee Furbish MD Electronically signed by Candee Furbish MD Signature Date/Time: 01/29/2019/3:45:26 PM    Final     Assessment / Plan:  5. 73 year old male with decompensated alcoholic cirrhosis (diagnosed by history and prior abdominal MRI) who presents to [facility] 12/25 with jaundice and RUQ pain. CTAP showed a mildly distended gallbladder with a small amount of pericholecystic fluid with surrounding inflammatory changes.  No evidence of choledocholithiasis or bilie duct dilatation. RUQ sono showed layering gallstones with an edematous mildly prominent gallbladder wall. CBD diameter measured 3.34m. Surgical consult by Dr. BNinfa Lindenwas done 12/26. Surgical intervention deferred as he is a Child's class C cirrhotic and not a surgical candidate at this time. Potential plans for a cholecystostomy tube per IR was discussed but deferred for now. Steroids deferred. Alk phos 304 >> 253.  AST 63 >> 56. ALT 37 >> 31. T. Bili 12.3 >> 13.6.  CRP 30. Worsening leukocytosis. WBC 27.4 >> 21.5. Hg 12.3 >> 11.9. PLT 106 >> 125. He remains afebrile. Tachycardic HR 100 - 106.  -Diagnostic  paracentesis stat, rule out SBP, to check peritoneal fluid for cell count and diff, gram stain, albumin, protein. (Will check cytology with next paracentesis which requires a large volume of peritoneal fluid). -Continue Zosyn 3.'375mg'$  IV Q 8 hrs for now  -I will order an abdominal MRI/MRCP with/without contrast after paracentesis completed later today.   2. Hepatic encephalopathy. No signs of HE at this time.  -continue Lactulose 20gm po bid for now -continue to monitor neurostatus closely   3. Thrombocytopenia secondary to cirrhosis   4. Coagulopathy. Received Oral Vit K x 3 days. INR 1.3.   5. Alcohol abuse -continue to monitor for alcohol withdrawal, CIWA protocol in place  6. History of prostate cancer  7. Sleep  Apnea   8. COVID 19 positive  11/330/2020  9. Hyponatremia. Na 132.  10. Right liver cyst   11. Irregular heart rhythm on exam today -EKG  Further recommendations per Dr. NSilverio Decamp    Principal Problem:   Acute cholecystitis Active Problems:   GERD   Type 2 diabetes mellitus with complication, with long-term [place] use of insulin [place]   Essential hypertension   Transaminitis   COVID-19 virus infection     LOS: 2 days   CNoralyn Pick 01/31/2019, 9:04 AM   Attending physician's note   I have taken an interval history, reviewed the chart and examined the patient. I agree with the Advanced Practitioner's note, impression and recommendations.   73year old male with decompensated alcoholic cirrhosis with ascites and jaundice.  History of COVID-19 infection last month.  CT abdomen and pelvis and abdominal ultrasound with no CBD dilation or findings to suggest CBD stone or obstruction.  Jaundice likely secondary to decompensated cirrhosis, bilirubin has plateaued  MELD score 19  Worsening leukocytosis with no clear source for infection He is on empiric broad-spectrum antibiotics Plan for diagnostic paracentesis to exclude SBP If no  evidence of SBP, will plan  for MRI with MRCP  No sign of hepatic encephalopathy on exam continue lactulose with goal 2-3 soft bowel movement  Discussed alcohol cessation  Raliegh Ip Denzil Magnuson , MD [phone]

## 2019-01-31 NOTE — Progress Notes (Signed)
OT Cancellation Note  Patient Details Name: Patrick Kidd MRN: [id number] DOB: 1945/07/08   Cancelled Treatment:    Reason Eval/Treat Not Completed: Patient at procedure or test/ unavailable   Will check on pt next day  Kari Baars, [place] [phone] Office- [phone], Thereasa Parkin 01/31/2019, 4:07 PM

## 2019-01-31 NOTE — Progress Notes (Signed)
PROGRESS NOTE    Patrick Kidd  WLS:[id number] DOB: 1945/11/25 DOA: 01/28/2019 PCP: [name], MD    Brief Narrative:  Patrick Kidd is a 73 year old Caucasian male with past medical history remarkable for type 2 diabetes mellitus, EtOH cirrhosis, portal hypertension, PUD, BPH, recent Covid-19 infection (01/03/2019)  that was treated with steroids who presented to the ED with right-sided epigastric abdominal pain.  He also reported generalized weakness, fatigue and yellowing to his skin.  He has been drinking vodka, reports roughly sixteen 8 ounce drinks per day.  Denies any nausea, vomiting, diarrhea or hematochezia.  No blood in stool.  In the ED, labs remarkable for transaminitis with obstructive pattern with alk phosphatase of 316, total bilirubin 11.2, indirect bilirubin 4.5, procalcitonin 3.9, CRP 27.2, the BC count 15.8, hemoglobin 13.7.  CT abdomen/pelvis with mildly distended gallbladder with surrounding inflammatory changes, small amount of pericholecystic fluid, findings suggestive of cirrhosis, and possible underlying cholecystitis.  Patient was referred for admission for possible treatment of acute cholecystitis.   Assessment & Plan:   Principal Problem:   Acute cholecystitis Active Problems:   GERD   Type 2 diabetes mellitus with complication, with long-term current use of insulin (HCC)   Essential hypertension   Transaminitis   COVID-19 virus infection  Abdominal pain secondary to acute cholecystitis vs developing spontaneous bacterial peritonitis CT abdomen/pelvis with findings suggestive of acute cholecystitis and right upper quadrant ultrasound notable for layering sludge/gallstones with mild gallbladder wall thickening likely secondary to mild acute cholecystitis; common bile duct diameter 3.7 mm. Lipase 13.  Per general surgery, patient not a candidate for surgical intervention secondary to child's class C cirrhosis, and signed off on 01/31/2019. --[place]  GI following; appreciate assistance --WBC 15.4-->15.8-->12.5-->21.5-->27.4 --PCT 3.89-->2.76 --Plan IR diagnostic paracentesis today for concern of SBP --Continue empiric antibiotics with Zosyn --MRI/MRCP abdomen with/without contrast following paracentesis today  Decompensated EtOH cirrhosis, acute Portal hypertension Etiology likely recent history of Covid-19 with steroid use versus uncontrolled diabetes versus continued EtOH abuse.  CT abdomen with hepatomegaly, cirrhosis and small perihepatic ascites. --GI following as above, no indication for steriods at this time --MELD-Na = 28 on admission, today down to 24 on 12/27 --ammonia level 31 --AST 46-->51-->56 --ALT 34-->37-->31 --AP 316-->301-->253 --Tbili 15.7-->15.6-->13.6 --INR 1.3-->1.3-->1.3 --Continue lactulose 20g PO BID --Vitamin K 51m PO daily x 3 days per GI --Salt restricted diet --Continue supportive care, trend CBC, LFTs, PT/INR  EtOH abuse disorder --CIWAA protocol with symptom triggered Ativan --Thiamine, folate acid, multivitamin  GERD: Continue PPI  T2DM, NIDDM Hemoglobin A1c 8.9 01/31/2019. On metformin outpatient --consistent carbohydrate diet --Blood sugars elevated this morning --increase Lantus to 25u [place] daily --Insulin sliding scale for further coverage --Repeat hemoglobin A1c in a.m. --CBGs q4h  Hyponatremia Likely multifactorial with dehydration from poor oral intake, pseudohyponatremia secondary to hyperglycemia. --Na 124-->131-->132 today --Discontinued IV fluids --Started on diet as above --Follow BMP daily  Recent Covid-19 infection Diagnosed on 01/03/2019, treated with steroids.  Currently oxygenating well on room air. --Discontinue airborne/contact isolation precautions as has been greater than 3 weeks since infection  Morbid obesity Body mass index is 31.87 kg/m. Discussed with patient needs for aggressive lifestyle changes and weight loss as this complicates all facets of care.   Weakness/debility: --PT evaluation pending   DVT prophylaxis: SCD's Code Status: Full Code Family Communication: Attempted to update patient's spouse via telephone this morning, no answer Disposition Plan: Remain inpatient, IV antibiotics, diagnostic paracentesis followed by MRI/MRCP abdomen today, further dependent on clinical course  Consultants:   [facility]  General Surgery - Dr. Ninfa Linden, Dr. Brantley Stage - signed off 01/31/2019  Procedures:   none  Antimicrobials:   Zosyn 12/26   Subjective: Patient seen and examined bedside, resting comfortably.  Reports continues with some a right upper quadrant/epigastric pain, left lower quadrant pain now resolved.  Reports had had to brown bowel movements over the past 24 hours.  White blood cell count now has increased to 27.4.  General surgery signed off today, no surgical intervention indicated at this time.  Concern now for SBP with ascites and significant epigastric tenderness with planned diagnostic paracentesis followed by MRCP/MRI abdomen.  Tolerating diet without issues.  No other complaints or concerns at this time. Denies headache, no visual changes, no chest pain, no shortness of breath, no nausea/vomiting/diarrhea, no cough/congestion, no fever/chills/night sweats, no palpitations.  No acute events overnight per nursing staff.  Objective: Vitals:   01/31/19 0024 01/31/19 0500 01/31/19 0514 01/31/19 1021  BP: 140/76  130/81 133/64  Pulse: (!) 106  97 (!) 109  Resp: _0 Temp:   97.6 F (36.4 C) 99.3 F (37.4 C)  TempSrc:   Oral Oral  SpO2: 94%  95% 94%  Weight:  112.6 kg    Height:        Intake/Output Summary (Last 24 hours) at 01/31/2019 1058 Last data filed at 01/31/2019 0600 Gross per 24 hour  Intake 359.41 ml  Output 450 ml  Net -90.59 ml   Filed Weights   01/28/19 1729 01/30/19 0422 01/31/19 0500  Weight: 106.6 kg 110.7 kg 112.6 kg    Examination:  General exam: Appears calm and comfortable,  appears older than stated age, nontoxic in appearance Respiratory system: Clear to auscultation. Respiratory effort normal.  Oxygenating well on room air. Cardiovascular system: S1 & S2 heard, RRR. No JVD, murmurs, rubs, gallops or clicks. No pedal edema. Gastrointestinal system: Abdomen is protuberant, soft with significant epigastric tenderness with some guarding, no rebound; no tenderness right upper quadrant. Normal bowel sounds heard. Central nervous system: Alert and oriented to person (Trump) Year (2020), and place ([place]). No focal neurological deficits. Extremities: Symmetric 5 x 5 power. Skin: No rashes, lesions or ulcers Psychiatry: Judgement and insight appear poor. Mood & affect appropriate.     Data Reviewed: I have personally reviewed following labs and imaging studies  CBC: Recent Labs  Lab 01/28/19 1833 01/29/19 0500 01/29/19 0626 01/30/19 0332 01/31/19 0600  WBC 15.4* 15.8* 12.5* 21.5* 27.4*  NEUTROABS 12.2*  --   --   --   --   HGB 12.8* 13.7 13.4 11.9* 12.3*  HCT 36.4* 38.9* 38.9* 35.1* 35.7*  MCV 96.0 95.8 97.3 99.2 98.3  PLT 97* 151 133* 125* 132*   Basic Metabolic Panel: Recent Labs  Lab 01/28/19 1833 01/29/19 0626 01/30/19 0332 01/31/19 0600  NA 124* 128* 131* 132*  K 4.7 3.8 4.5 3.8  CL 86* 88* 89* 94*  CO2 _1 GLUCOSE 514* 300* 272* 203*  BUN 29* 28* 31* 32*  CREATININE 1.15 1.04 0.72 0.97  CALCIUM 8.3* 8.2* 7.5* 7.7*  MG 1.5*  --   --   --    GFR: Estimated Creatinine Clearance: 89.2 mL/min (by C-G formula based on SCr of 0.97 mg/dL). Liver Function Tests: Recent Labs  Lab 01/28/19 1833 01/29/19 0626 01/30/19 0332 01/31/19 0600  AST 46* 51* 56* 63*  ALT 34 37 31 37  ALKPHOS 316* 301* 253* 304*  BILITOT 15.7* 15.6* 13.6* 12.3*  PROT 6.0* 6.1* 5.9* 5.5*  ALBUMIN 2.1* 2.1* 2.0* 2.0*   Recent Labs  Lab 01/30/19 0332  LIPASE 13   Recent Labs  Lab 01/29/19 1143  AMMONIA 31   Coagulation Profile: Recent Labs  Lab  01/28/19 1833 01/29/19 0500 01/30/19 0332 01/31/19 0600  INR 1.3* 1.3* 1.3* 1.3*   Cardiac Enzymes: Recent Labs  Lab 01/29/19 0500  CKTOTAL 12*  CKMB 1.6   BNP (last 3 results) No results for input(s): PROBNP in the last 8760 hours. HbA1C: Recent Labs    01/31/19 0600  HGBA1C 8.9*   CBG: Recent Labs  Lab 01/30/19 1636 01/30/19 2127 01/31/19 0010 01/31/19 0532 01/31/19 0820  GLUCAP 229* 185* 218* 194* 181*   Lipid Profile: No results for input(s): CHOL, HDL, LDLCALC, TRIG, CHOLHDL, LDLDIRECT in the last 72 hours. Thyroid Function Tests: Recent Labs    01/29/19 0500  TSH 1.830   Anemia Panel: Recent Labs    01/29/19 0634  FERRITIN 996*   Sepsis Labs: Recent Labs  Lab 01/29/19 0626 01/31/19 0600  PROCALCITON 3.89 2.76    Recent Results (from the past 240 hour(s))  Culture, blood (routine x 2)     Status: None (Preliminary result)   Collection Time: 01/29/19 12:23 AM   Specimen: BLOOD  Result Value Ref Range Status   Specimen Description   Final    BLOOD RIGHT ANTECUBITAL Performed at [facility], [place] [address], [place], [place] [address]    Special Requests   Final    BOTTLES DRAWN AEROBIC AND ANAEROBIC Blood Culture adequate volume Performed at [facility] [address], [place], [place] [address]    Culture   Final    NO GROWTH 1 DAY Performed at [facility], [place] [address], [place], [place] [address]    Report Status PENDING  Incomplete         Radiology Studies: ECHOCARDIOGRAM COMPLETE  Result Date: 01/29/2019   ECHOCARDIOGRAM REPORT   Patient Name:   GRANTHAM HIPPERT Date of Exam: 01/29/2019 Medical Rec #:  [id number]          Height:       74.0 in Accession #:    [id number]         Weight:       235.0 lb Date of Birth:  [birth year]          BSA:          2.33 m Patient Age:    63 years           BP:           134/68 mmHg Patient Gender: M                  HR:           117 bpm. Exam  Location:  Inpatient Procedure: 2D Echo and Intracardiac Opacification Agent Indications:    Elevated troponin; I10 Hypertension  History:        Patient has no prior history of Echocardiogram examinations.                 Signs/Symptoms:Altered Mental Status. Covid19.  Sonographer:    Merrie Roof RDCS Referring Phys: [place]  1. Left ventricular ejection fraction, by visual estimation, is 60 to 65%. The left ventricle has normal function. There is no left ventricular hypertrophy.  2. Definity contrast agent was given IV to delineate  the left ventricular endocardial borders.  3. Left ventricular diastolic parameters are consistent with Grade I diastolic dysfunction (impaired relaxation).  4. The left ventricle has no regional wall motion abnormalities.  5. Global right ventricle has normal systolic function.The right ventricular size is normal. No increase in right ventricular wall thickness.  6. Left atrial size was normal.  7. Right atrial size was normal.  8. The mitral valve is normal in structure. No evidence of mitral valve regurgitation. No evidence of mitral stenosis.  9. The tricuspid valve is normal in structure. 10. The aortic valve is normal in structure. Aortic valve regurgitation is not visualized. No evidence of aortic valve sclerosis or stenosis. 11. The pulmonic valve was normal in structure. Pulmonic valve regurgitation is not visualized. 12. The inferior vena cava is normal in size with greater than 50% respiratory variability, suggesting right atrial pressure of 3 mmHg. FINDINGS  Left Ventricle: Left ventricular ejection fraction, by visual estimation, is 60 to 65%. The left ventricle has normal function. Definity contrast agent was given IV to delineate the left ventricular endocardial borders. The left ventricle has no regional wall motion abnormalities. There is no left ventricular hypertrophy. Left ventricular diastolic parameters are consistent with Grade I diastolic  dysfunction (impaired relaxation). Normal left atrial pressure. Right Ventricle: The right ventricular size is normal. No increase in right ventricular wall thickness. Global RV systolic function is has normal systolic function. Left Atrium: Left atrial size was normal in size. Right Atrium: Right atrial size was normal in size Pericardium: There is no evidence of pericardial effusion. Mitral Valve: The mitral valve is normal in structure. No evidence of mitral valve regurgitation. No evidence of mitral valve stenosis by observation. Tricuspid Valve: The tricuspid valve is normal in structure. Tricuspid valve regurgitation is not demonstrated. Aortic Valve: The aortic valve is normal in structure. Aortic valve regurgitation is not visualized. The aortic valve is structurally normal, with no evidence of sclerosis or stenosis. Pulmonic Valve: The pulmonic valve was normal in structure. Pulmonic valve regurgitation is not visualized. Pulmonic regurgitation is not visualized. Aorta: The aortic root, ascending aorta and aortic arch are all structurally normal, with no evidence of dilitation or obstruction. Venous: The inferior vena cava is normal in size with greater than 50% respiratory variability, suggesting right atrial pressure of 3 mmHg. IAS/Shunts: No atrial level shunt detected by color flow Doppler. There is no evidence of a patent foramen ovale. No ventricular septal defect is seen or detected. There is no evidence of an atrial septal defect.  LEFT VENTRICLE PLAX 2D LVIDd:         4.90 cm LVIDs:         3.20 cm LV PW:         1.10 cm LV IVS:        1.10 cm LVOT diam:     1.90 cm LV SV:         72 ml LV SV Index:   30.16 LVOT Area:     2.84 cm  LEFT ATRIUM           Index LA diam:      4.10 cm 1.76 cm/m LA Vol (A2C): 23.7 ml 10.18 ml/m LA Vol (A4C): 53.9 ml 23.16 ml/m  AORTIC VALVE LVOT Vmax:   150.00 cm/s LVOT Vmean:  7010.000 cm/s  SHUNTS Systemic Diam: 1.90 cm  Candee Furbish MD Electronically signed by Candee Furbish MD Signature Date/Time: 01/29/2019/3:45:26 PM    Final  Scheduled Meds: . folic acid  1 mg Oral Daily  . insulin aspart  0-9 Units Subcutaneous Q4H  . insulin glargine  25 Units Subcutaneous Daily  . lactulose  20 g Oral BID  . LORazepam  0-4 mg Intravenous Q12H  . multivitamin with minerals  1 tablet Oral Daily  . pantoprazole  40 mg Oral BID  . sodium chloride flush  3 mL Intravenous Q12H  . thiamine  100 mg Oral Daily   Or  . thiamine  100 mg Intravenous Daily   Continuous Infusions: . sodium chloride 250 mL (01/30/19 1420)  . albumin human 25 g (01/31/19 1024)  . piperacillin-tazobactam (ZOSYN)  IV 3.375 g (01/31/19 0512)     LOS: 2 days    Time spent: 37 minutes spent on chart review, discussion with nursing staff, consultants, updating family and interview/physical exam; more than 50% of that time was spent in counseling and/or coordination of care.     J [place] DO [facility] 01/31/2019, 10:58 AM

## 2019-01-31 NOTE — Evaluation (Addendum)
Physical Therapy Evaluation Patient Details Name: Patrick Kidd MRN: [id number] DOB: 1945/09/10 Today's Date: 01/31/2019   History of Present Illness  73 year old Caucasian male with past medical history remarkable for type 2 diabetes mellitus, EtOH cirrhosis, portal hypertension, PUD, BPH, recent Covid-19 infection (01/03/2019)  that was treated with steroids who presented to the ED with right-sided epigastric abdominal pain.  He also reported generalized weakness, fatigue and yellowing to his skin.  He has been drinking vodka, reports roughly sixteen 8 ounce drinks per day  Clinical Impression  Pt reports independence at his baseline, recent falls. Pt is significantly deconditioned and at risk for further falls and would benefit from SNF post acute. Will follow in acute setting. Pt is quite cooperative and agreeable to work with PT  Follow Up Recommendations SNF;Supervision/Assistance - 24 hour    Equipment Recommendations  None recommended by PT    Recommendations for Other Services       Precautions / Restrictions Precautions Precautions: Fall Restrictions Weight Bearing Restrictions: No      Mobility  Bed Mobility Overal bed mobility: Needs Assistance Bed Mobility: Supine to Sit     Supine to sit: Min assist     General bed mobility comments: incr time, assist to elevate trunk  Transfers Overall transfer level: Needs assistance Equipment used: Rolling walker (2 wheeled) Transfers: Sit to/from Stand Sit to Stand: Min assist;From elevated surface Stand pivot with min assist and RW         General transfer comment: assist to rise and stabilize in standing  Ambulation/Gait Ambulation/Gait assistance: Min assist  12' (in room) Assistive device: Rolling walker (2 wheeled) Gait Pattern/deviations: Decreased stride length Gait velocity: decr      Stairs            Wheelchair Mobility    Modified Rankin (Stroke Patients Only)       Balance  Overall balance assessment: Needs assistance Sitting-balance support: No upper extremity supported;Feet supported Sitting balance-Leahy Scale: Fair       Standing balance-Leahy Scale: Poor Standing balance comment: reliant on UEs                             Pertinent Vitals/Pain Pain Assessment: No/denies pain    Home Living Family/patient expects to be discharged to:: Private residence Living Arrangements: Spouse/significant other;Children Available Help at Discharge: Family Type of Home: House Home Access: Stairs to enter   Technical brewer of Steps: 1 Home Layout: One level Home Equipment: Cane - single point;Walker - 2 wheels      Prior Function Level of Independence: Independent with assistive device(s)         Comments: recently amb with cane d/t knee pain     Hand Dominance        Extremity/Trunk Assessment   Upper Extremity Assessment Upper Extremity Assessment: Defer to OT evaluation    Lower Extremity Assessment Lower Extremity Assessment: Generalized weakness       Communication   Communication: No difficulties  Cognition Arousal/Alertness: Awake/alert Behavior During Therapy: WFL for tasks assessed/performed Overall Cognitive Status: Within Functional Limits for tasks assessed                                        General Comments      Exercises     Assessment/Plan    PT Assessment Patient needs  continued PT services  PT Problem List Decreased strength;Decreased mobility;Decreased range of motion;Decreased activity tolerance;Decreased knowledge of use of DME;Decreased balance       PT Treatment Interventions DME instruction;Gait training;Functional mobility training;Therapeutic activities;Patient/family education;Therapeutic exercise    PT Goals (Current goals can be found in the Care Plan section)  Acute Rehab PT Goals PT Goal Formulation: With patient Time For Goal Achievement:  02/14/19 Potential to Achieve Goals: Good    Frequency Min 2X/week   Barriers to discharge        Co-evaluation               AM-PAC PT "6 Clicks" Mobility  Outcome Measure Help needed turning from your back to your side while in a flat bed without using bedrails?: A Little Help needed moving from lying on your back to sitting on the side of a flat bed without using bedrails?: A Little Help needed moving to and from a bed to a chair (including a wheelchair)?: A Little Help needed standing up from a chair using your arms (e.g., wheelchair or bedside chair)?: A Little Help needed to walk in hospital room?: A Little Help needed climbing 3-5 steps with a railing? : A Lot 6 Click Score: 17    End of Session Equipment Utilized During Treatment: Gait belt Activity Tolerance: Patient limited by fatigue Patient left: in chair;with call bell/phone within reach;with chair alarm set   PT Visit Diagnosis: Difficulty in walking, not elsewhere classified (R26.2);Muscle weakness (generalized) (M62.81)    Time: [id number] PT Time Calculation (min) (ACUTE ONLY): 27 min   Charges:   PT Evaluation $PT Eval Low Complexity: 1 Low PT Treatments $Gait Training: 8-22 mins        Baxter Flattery, PT   Acute Rehab Dept [facility]): [id number]   01/31/2019   [facility] 01/31/2019, 12:18 PM

## 2019-01-31 NOTE — Progress Notes (Addendum)
A stat sono guided paracentesis was ordered earlier today. I communicated with Raquel Sarna in [place] as the paracentesis has not yet been completed due to excessive procedures scheduled in sono today.  Raquel Sarna verified the patient's paracentesis will be done around 2pm today.  Patient to have a stat abdominal MRI/MRCP w/wo contrast after paracentesis completed. I communicated with Jinny Blossom in [place] to facilitate scheduling the MRI/MRCP after the paracentesis.  A stat MRI/MRCP to be scheduled around 5:30-6pm. Orders entered. Pt NPO starting now in prep for MRI/MRCP as instructed by [facility] in [place].  GI attending addendum:  Ascites fluid is positive for peritonitis with elevated neutrophil count >45000  Discussed results with Hospitalist, Dr.[place]  Will switch to Augmentin. On Zosyn since 12/26 with persistent leucoytosis and SBP Await gram stain and culture Request ID consult tomorrow AM IV albumin X100 gm today  Damaris Hippo , MD [phone]

## 2019-01-31 NOTE — Progress Notes (Signed)
Pt eating a regular diet without pain   No role for intervention at this point    Recommend he follow up with GI medicine / PCP     Will sign off

## 2019-01-31 NOTE — Procedures (Signed)
Ultrasound-guided diagnostic paracentesis performed yielding 100 cc (maximum ordered) of turbid, amber colored fluid. No immediate complications.  The fluid was submitted to the lab for preordered studies. EBL none.

## 2019-01-31 NOTE — Progress Notes (Signed)
Initial Nutrition Assessment  RD working remotely.   DOCUMENTATION CODES:   Not applicable  INTERVENTION:  - diet advancement as medically feasible.   NUTRITION DIAGNOSIS:   Increased nutrient needs related to acute illness as evidenced by estimated needs.  GOAL:   Patient will meet greater than or equal to 90% of their needs  MONITOR:   Diet advancement, Labs, Weight trends  REASON FOR ASSESSMENT:   Malnutrition Screening Tool  ASSESSMENT:   73 year old male with past medical history of type 2 DM, alcoholic cirrhosis, portal HTN, PUD, BPH, and recent Covid-19 infection (01/03/2019) that was treated with steroids. He presented to the ED with R-sided epigastric pain, generalized weakness, fatigue, and yellowed skin. He has been drinking about sixteen 8-oz vodka drinks/day. He denied N/V/D/ or hematochezia. In the ED has was noted to have transaminitis. CT abd/pelvis showed mildly distended gallbladder with surrounding inflammatory changes, small amount of pericholecystic fluid suggesting cirrhosis and possible underlying cholecystitis.  Patient was out of the room earlier today.Diet advanced from NPO to Carb Modified on 12/26 at ~1200 and patient ate 50% of breakfast this AM before being made NPO again at 1342.   Per chart review, current weight is 248 lb and weight on 12/25 was 235 lb. Weight has been fairly stable over the past 11-12 months.   Per notes: - abdominal pain 2/2 acute cholecystitis vs developing spontaneous bacterial peritonitis--plan for MRI/MRCP - decompensated alcoholic cirrhosis and acute portal HTN--s/p paracentesis today with 100 ml removed (max ordered) - hyponatremia - recent COVID-19 infection--no contact precautions as positive test was >3 weeks ago - weakness/debility--PT to eval   Labs reviewed; CBGs: 194, 181, 237, and 209 mg/dl, Na: 132 mmol/l, Cl: 94 mmol/l, BUN: 32 mg/dl, Ca: 7.7 mg/dl, Alk Phos elevated. Medications reviewed; 25 g albumin x1  dose/day for 3 days, 1 mg folvite/day, sliding scale novolog, 25 units lantus/day, 20 g lactulose BID, daily multivitamin with minerals, 40 mg oral protonix BID, 5 mg vitamin K x1 dose/day for 3 days (12/26-12/28), 100 mg thiamine/day.     NUTRITION - FOCUSED PHYSICAL EXAM:  unable to complete at this time.   Diet Order:   Diet Order            Diet NPO time specified  Diet effective now              EDUCATION NEEDS:   Not appropriate for education at this time  Skin:  Skin Assessment: Reviewed RN Assessment  Last BM:  12/28  Height:   Ht Readings from Last 1 Encounters:  01/28/19 6' 2"  (1.88 m)    Weight:   Wt Readings from Last 1 Encounters:  01/31/19 112.6 kg    Ideal Body Weight:  86.4 kg  BMI:  Body mass index is 31.87 kg/m.  Estimated Nutritional Needs:   Kcal:  2000-2200 kcal  Protein:  105-115 grams  Fluid:  >/= 2 L/day      Jarome Matin, MS, RD, LDN, [facility] Inpatient Clinical Dietitian Pager # [phone] After hours/weekend pager # [phone]

## 2019-02-01 ENCOUNTER — Inpatient Hospital Stay (HOSPITAL_COMMUNITY): Payer: Medicare Other

## 2019-02-01 DIAGNOSIS — R188 Other ascites: Secondary | ICD-10-CM

## 2019-02-01 DIAGNOSIS — N179 Acute kidney failure, unspecified: Secondary | ICD-10-CM

## 2019-02-01 DIAGNOSIS — K7682 Hepatic encephalopathy: Secondary | ICD-10-CM

## 2019-02-01 DIAGNOSIS — K7031 Alcoholic cirrhosis of liver with ascites: Secondary | ICD-10-CM

## 2019-02-01 DIAGNOSIS — K652 Spontaneous bacterial peritonitis: Secondary | ICD-10-CM

## 2019-02-01 DIAGNOSIS — F10231 Alcohol dependence with withdrawal delirium: Secondary | ICD-10-CM

## 2019-02-01 DIAGNOSIS — K7290 Hepatic failure, unspecified without coma: Principal | ICD-10-CM

## 2019-02-01 LAB — COMPREHENSIVE METABOLIC PANEL
ALT: 28 U/L (ref 0–44)
AST: 48 U/L — ABNORMAL HIGH (ref 15–41)
Albumin: 3.3 g/dL — ABNORMAL LOW (ref 3.5–5.0)
Alkaline Phosphatase: 267 U/L — ABNORMAL HIGH (ref 38–126)
Anion gap: 14 (ref 5–15)
BUN: 31 mg/dL — ABNORMAL HIGH (ref 8–23)
CO2: 27 mmol/L (ref 22–32)
Calcium: 8.1 mg/dL — ABNORMAL LOW (ref 8.9–10.3)
Chloride: 93 mmol/L — ABNORMAL LOW (ref 98–111)
Creatinine, Ser: 0.94 mg/dL (ref 0.61–1.24)
GFR calc Af Amer: >60 mL/min (ref >60)
GFR calc non Af Amer: >60 mL/min (ref >60)
Glucose, Bld: 222 mg/dL — ABNORMAL HIGH (ref 70–99)
Potassium: 3.6 mmol/L (ref 3.5–5.1)
Sodium: 134 mmol/L — ABNORMAL LOW (ref 135–145)
Total Bilirubin: 10.8 mg/dL — ABNORMAL HIGH (ref 0.3–1.2)
Total Protein: 6.2 g/dL — ABNORMAL LOW (ref 6.5–8.1)

## 2019-02-01 LAB — GLUCOSE, CAPILLARY
Glucose-Capillary: 125 mg/dL — ABNORMAL HIGH (ref 70–99)
Glucose-Capillary: 132 mg/dL — ABNORMAL HIGH (ref 70–99)
Glucose-Capillary: 138 mg/dL — ABNORMAL HIGH (ref 70–99)
Glucose-Capillary: 148 mg/dL — ABNORMAL HIGH (ref 70–99)
Glucose-Capillary: 171 mg/dL — ABNORMAL HIGH (ref 70–99)
Glucose-Capillary: 206 mg/dL — ABNORMAL HIGH (ref 70–99)
Glucose-Capillary: 241 mg/dL — ABNORMAL HIGH (ref 70–99)

## 2019-02-01 LAB — IRON: Iron: 17 ug/dL — ABNORMAL LOW (ref 45–182)

## 2019-02-01 LAB — CBC
HCT: 33 % — ABNORMAL LOW (ref 39.0–52.0)
Hemoglobin: 11.2 g/dL — ABNORMAL LOW (ref 13.0–17.0)
MCH: 33.7 pg (ref 26.0–34.0)
MCHC: 33.9 g/dL (ref 30.0–36.0)
MCV: 99.4 fL (ref 80.0–100.0)
Platelets: 108 10*3/uL — ABNORMAL LOW (ref 150–400)
RBC: 3.32 MIL/uL — ABNORMAL LOW (ref 4.22–5.81)
RDW: 14.3 % (ref 11.5–15.5)
WBC: 19.8 10*3/uL — ABNORMAL HIGH (ref 4.0–10.5)
nRBC: 0 % (ref 0.0–0.2)

## 2019-02-01 LAB — PATHOLOGIST SMEAR REVIEW

## 2019-02-01 LAB — MRSA PCR SCREENING: MRSA by PCR: NEGATIVE

## 2019-02-01 LAB — AMMONIA: Ammonia: 35 umol/L (ref 9–35)

## 2019-02-01 LAB — PROTIME-INR
INR: 1.4 — ABNORMAL HIGH (ref 0.8–1.2)
Prothrombin Time: 16.7 seconds — ABNORMAL HIGH (ref 11.4–15.2)

## 2019-02-01 LAB — PROCALCITONIN: Procalcitonin: 2.1 ng/mL

## 2019-02-01 MED ORDER — LORAZEPAM 1 MG PO TABS: 1.0000 mg | ORAL_TABLET | ORAL | Status: AC | PRN

## 2019-02-01 MED ORDER — PHYTONADIONE 5 MG PO TABS
5.0000 mg | ORAL_TABLET | Freq: Once | ORAL | Status: AC
  Administered 2019-02-02: 5 mg via ORAL
  Filled 2019-02-01 (×2): qty 1

## 2019-02-01 MED ORDER — AMOXICILLIN-POT CLAVULANATE 875-125 MG PO TABS: 1.0000 | ORAL_TABLET | Freq: Two times a day (BID) | ORAL | Status: DC

## 2019-02-01 MED ORDER — ORAL CARE MOUTH RINSE
15.0000 mL | Freq: Two times a day (BID) | OROMUCOSAL | Status: DC
  Administered 2019-02-02 – 2019-02-07 (×13): 15 mL via OROMUCOSAL

## 2019-02-01 MED ORDER — SODIUM CHLORIDE 0.9 % IV SOLN: 2.0000 g | INTRAVENOUS | Status: DC

## 2019-02-01 MED ORDER — DEXMEDETOMIDINE HCL IN NACL 400 MCG/100ML IV SOLN
0.4000 ug/kg/h | INTRAVENOUS | Status: DC
  Administered 2019-02-02: 0.4 ug/kg/h via INTRAVENOUS
  Administered 2019-02-02 (×2): 0.5 ug/kg/h via INTRAVENOUS
  Administered 2019-02-03: 0.2 ug/kg/h via INTRAVENOUS
  Filled 2019-02-01 (×5): qty 100

## 2019-02-01 MED ORDER — HEPARIN SODIUM (PORCINE) 5000 UNIT/ML IJ SOLN
5000.0000 [IU] | Freq: Three times a day (TID) | INTRAMUSCULAR | Status: DC
  Administered 2019-02-01 – 2019-02-09 (×21): 5000 [IU] via SUBCUTANEOUS
  Filled 2019-02-01 (×21): qty 1

## 2019-02-01 MED ORDER — LORAZEPAM 2 MG/ML IJ SOLN
1.0000 mg | INTRAMUSCULAR | Status: AC | PRN
  Administered 2019-02-01 (×2): 4 mg via INTRAVENOUS
  Filled 2019-02-01 (×2): qty 2

## 2019-02-01 MED ORDER — ZINC SULFATE 220 (50 ZN) MG PO CAPS
220.0000 mg | ORAL_CAPSULE | Freq: Every day | ORAL | Status: DC
  Administered 2019-02-02 – 2019-02-09 (×8): 220 mg via ORAL
  Filled 2019-02-01 (×8): qty 1

## 2019-02-01 MED ORDER — DEXMEDETOMIDINE HCL IN NACL 200 MCG/50ML IV SOLN
0.4000 ug/kg/h | INTRAVENOUS | Status: DC
  Administered 2019-02-01 (×2): 0.5 ug/kg/h via INTRAVENOUS
  Administered 2019-02-01: 0.4 ug/kg/h via INTRAVENOUS
  Filled 2019-02-01 (×3): qty 50

## 2019-02-01 MED ORDER — LACTULOSE ENEMA
300.0000 mL | Freq: Three times a day (TID) | ORAL | Status: DC
  Administered 2019-02-01: 300 mL via RECTAL
  Filled 2019-02-01 (×2): qty 300

## 2019-02-01 MED ORDER — METRONIDAZOLE IN NACL 5-0.79 MG/ML-% IV SOLN: 500.0000 mg | Freq: Three times a day (TID) | INTRAVENOUS | Status: DC

## 2019-02-01 MED ORDER — SODIUM CHLORIDE 0.9 % IV SOLN
3.0000 g | Freq: Four times a day (QID) | INTRAVENOUS | Status: DC
  Administered 2019-02-01 – 2019-02-04 (×11): 3 g via INTRAVENOUS
  Filled 2019-02-01: qty 8
  Filled 2019-02-01: qty 3
  Filled 2019-02-01: qty 8
  Filled 2019-02-01 (×2): qty 3
  Filled 2019-02-01: qty 8
  Filled 2019-02-01 (×3): qty 3
  Filled 2019-02-01: qty 8
  Filled 2019-02-01 (×3): qty 3
  Filled 2019-02-01: qty 8
  Filled 2019-02-01: qty 3
  Filled 2019-02-01: qty 8

## 2019-02-01 MED ORDER — CHLORHEXIDINE GLUCONATE CLOTH 2 % EX PADS
6.0000 | MEDICATED_PAD | Freq: Every day | CUTANEOUS | Status: DC
  Administered 2019-02-01 – 2019-02-05 (×4): 6 via TOPICAL

## 2019-02-01 MED ORDER — LACTULOSE 10 GM/15ML PO SOLN
10.0000 g | Freq: Three times a day (TID) | ORAL | Status: DC
  Administered 2019-02-02: 10 g via ORAL
  Filled 2019-02-01: qty 15

## 2019-02-01 MED ORDER — LORAZEPAM 2 MG/ML IJ SOLN
1.0000 mg | Freq: Once | INTRAMUSCULAR | Status: AC
  Administered 2019-02-01: 1 mg via INTRAVENOUS
  Filled 2019-02-01: qty 1

## 2019-02-01 MED ORDER — PIPERACILLIN-TAZOBACTAM 3.375 G IVPB
3.3750 g | Freq: Three times a day (TID) | INTRAVENOUS | Status: DC
  Administered 2019-02-01: 3.375 g via INTRAVENOUS
  Filled 2019-02-01: qty 50

## 2019-02-01 MED ORDER — RIFAXIMIN 200 MG PO TABS
200.0000 mg | ORAL_TABLET | Freq: Two times a day (BID) | ORAL | Status: DC
  Administered 2019-02-02: 200 mg via ORAL
  Filled 2019-02-01 (×3): qty 1

## 2019-02-01 NOTE — Progress Notes (Signed)
   02/01/19 1433  Vitals  Temp 100 F (37.8 C)  Temp Source Axillary  BP (!) 157/70  MAP (mmHg) 80  BP Location Right Arm  BP Method Automatic  Patient Position (if appropriate) Lying  Pulse Rate (!) 110  Resp (!) 28  Oxygen Therapy  SpO2 95 %  O2 Device Nasal Cannula  O2 Flow Rate (L/min) 2 L/min  MEWS Score  MEWS RR 2  MEWS Pulse 1  MEWS Systolic 0  MEWS LOC 1  MEWS Temp 0  MEWS Score 4  MEWS Score Color Red   Pt change in status. MEW's changed from yellow to red. PT currently going through active alcohol withdrawals. MD notified of change in status. Critical care was contacted by hospitalist. Transfer order entered. Will continue to closely monitor pt.

## 2019-02-01 NOTE — Progress Notes (Signed)
Patient's wife [name] was called and updated on patient.

## 2019-02-01 NOTE — Progress Notes (Addendum)
[facility] Progress Note  CC: Decompensated cirrhosis, jaundice and upper abdominal pain  Subjective: Patient was agitated this morning and he received Lorazepam 62m IV by his RN per CIWA protocol. He is currently sedated, barely opens his eyes when name called. He is able to squeeze my hands on command.   Paracentesis 01/31/2019: confirmed SBP with PMN count greater than 45,000. Cultures and gram stain pending.  Abdominal MRI/MRCP:  1. Mildly motion degraded exam. 2. No biliary duct dilatation or choledocholithiasis. 3. Asymmetric gallbladder wall thickening in the setting of stones/sludge on ultrasound. Nonspecific in the setting of cirrhosis. Cannot exclude a component of chronic cholecystitis. 4. Cirrhosis. 5. Trace left pleural fluid. 6. Increase in small volume abdominal ascites  Objective:  Vital signs in last 24 hours: Temp:  [97.8 F (36.6 C)-98.5 F (36.9 C)] 97.8 F (36.6 C) (12/28 2029) Pulse Rate:  [82-126] 126 (12/29 0557) Resp:  [18-24] 24 (12/29 0557) BP: (118-167)/(60-86) 158/85 (12/29 1000) SpO2:  [91 %-98 %] 94 % (12/29 1000) Weight:  [110.6 kg] 110.6 kg (12/29 0500) Last BM Date: 01/31/19 General:  Sedated, difficult to arouse.  Eyes: Scleral icterus present, PERRLA.  Heart: RRR, no murmur. Pulm: Few scattered wheezes and crackles to the bases bilaterally.  Abdomen: Distended but not tense, + ascites, no guarding, hypoactive BS x 4 quads, no obvious HSM. Extremities:  Without edema. Neurologic:  Sedated, barely opens eyes to deep stimulation. Squeezes my hands on command. Unable to perform asterixis exam.  Psych:  Sedated.   Intake/Output from previous day: 12/28 0701 - 12/29 0700 In: 469.3 [P.O.:240; I.V.:12.6; IV Piggyback:216.7] Out: 600 [Urine:600] Intake/Output this shift: Total I/O In: 3 [I.V.:3] Out: 250 [Urine:250]  Lab Results: Recent Labs    01/30/19 0332 01/31/19 0600 02/01/19 0244  WBC 21.5* 27.4* 19.8*  HGB  11.9* 12.3* 11.2*  HCT 35.1* 35.7* 33.0*  PLT 125* 106* 108*   BMET Recent Labs    01/30/19 0332 01/31/19 0600 02/01/19 0244  NA 131* 132* 134*  K 4.5 3.8 3.6  CL 89* 94* 93*  CO2 23 23 27   GLUCOSE 272* 203* 222*  BUN 31* 32* 31*  CREATININE 0.72 0.97 0.94  CALCIUM 7.5* 7.7* 8.1*   LFT Recent Labs    02/01/19 0244  PROT 6.2*  ALBUMIN 3.3*  AST 48*  ALT 28  ALKPHOS 267*  BILITOT 10.8*   PT/INR Recent Labs    01/31/19 0600 02/01/19 0244  LABPROT 16.4* 16.7*  INR 1.3* 1.4*   Hepatitis Panel No results for input(s): HEPBSAG, HCVAB, HEPAIGM, HEPBIGM in the last 72 hours.  MR 3D Recon At Scanner  Result Date: 01/31/2019 CLINICAL DATA:  Alcoholic cirrhosis. Jaundice. Rule out intra and extrahepatic biliary duct obstruction. Diabetes. Portal hypertension. Recent COVID infection 1 month ago. EXAM: MRI ABDOMEN WITHOUT AND WITH CONTRAST (INCLUDING MRCP) TECHNIQUE: Multiplanar multisequence MR imaging of the abdomen was performed both before and after the administration of intravenous contrast. Heavily T2-weighted images of the biliary and pancreatic ducts were obtained, and three-dimensional MRCP images were rendered by post processing. CONTRAST:  157mGADAVIST GADOBUTROL 1 MMOL/ML IV SOLN COMPARISON:  01/29/2019 abdominal ultrasound. 01/28/2019 abdominopelvic CT. 07/27/2018 abdominal MRI. FINDINGS: Mild motion degradation throughout. Lower chest: Bibasilar atelectasis. Normal heart size. Trace left pleural fluid. Hepatobiliary: Moderate cirrhosis. Dominant right hepatic lobe cyst. No suspicious liver lesion. The gallstones and sludge are more apparent on ultrasound. There is primarily medial gallbladder wall thickening including at 8 mm on 36/8. New since  the prior MRI. Mild mucosal enhancement throughout the gallbladder, without surrounding hyperemia. No intra or extrahepatic biliary duct dilatation. The common duct measures on the order of 4 mm on 82/6. No choledocholithiasis.  Pancreas: Normal pancreas for age, without duct dilatation or acute inflammation. Spleen:  Borderline splenomegaly at 14.7 cm craniocaudal. Adrenals/Urinary Tract: Normal adrenal glands. Normal kidneys, without hydronephrosis. Stomach/Bowel: Gastric antral underdistention. Normal caliber of abdominal bowel loops. Vascular/Lymphatic: Aortic atherosclerosis. Patent portal, splenic, hepatic veins. Prominent porta hepatis nodes are likely reactive and similar. Other: Small volume abdominal ascites, increased compared to the prior CT. For example, new fluid is seen surrounding the left hepatic lobe and extending in the lesser sac, including on 34/3. Musculoskeletal: No acute osseous abnormality. IMPRESSION: 1. Mildly motion degraded exam. 2. No biliary duct dilatation or choledocholithiasis. 3. Asymmetric gallbladder wall thickening in the setting of stones/sludge on ultrasound. Nonspecific in the setting of cirrhosis. Cannot exclude a component of chronic cholecystitis. 4. Cirrhosis. 5. Trace left pleural fluid. 6. Increase in small volume abdominal ascites. Electronically Signed   By: Abigail Miyamoto M.D.   On: 01/31/2019 18:57   US Paracentesis  Result Date: 01/31/2019 INDICATION: Patient with history of alcoholic cirrhosis, portal hypertension, recent COVID-19 infection, abdominal pain, ascites. Request made for diagnostic paracentesis up to 100 cc. EXAM: ULTRASOUND GUIDED DIAGNOSTIC PARACENTESIS MEDICATIONS: None COMPLICATIONS: None immediate. PROCEDURE: Informed written consent was obtained from the patient after a discussion of the risks, benefits and alternatives to treatment. A timeout was performed prior to the initiation of the procedure. Initial ultrasound scanning demonstrates a small amount of ascites within the right upper to mid abdominal quadrant. The right upper to mid abdomen was prepped and draped in the usual sterile fashion. 1% lidocaine was used for local anesthesia. Following this, a 19 gauge,  7-cm, Yueh catheter was introduced. An ultrasound image was saved for documentation purposes. The paracentesis was performed. The catheter was removed and a dressing was applied. The patient tolerated the procedure well without immediate post procedural complication. FINDINGS: A total of approximately 100 cc of turbid, amber fluid was removed. Samples were sent to the laboratory as requested by the clinical team. IMPRESSION: Successful ultrasound-guided diagnostic paracentesis yielding 100 cc of peritoneal fluid. Read by: Rowe Robert, PA-C Electronically Signed   By: Sandi Mariscal M.D.   On: 01/31/2019 15:10   MR ABDOMEN MRCP W WO CONTAST  Result Date: 01/31/2019 CLINICAL DATA:  Alcoholic cirrhosis. Jaundice. Rule out intra and extrahepatic biliary duct obstruction. Diabetes. Portal hypertension. Recent COVID infection 1 month ago. EXAM: MRI ABDOMEN WITHOUT AND WITH CONTRAST (INCLUDING MRCP) TECHNIQUE: Multiplanar multisequence MR imaging of the abdomen was performed both before and after the administration of intravenous contrast. Heavily T2-weighted images of the biliary and pancreatic ducts were obtained, and three-dimensional MRCP images were rendered by post processing. CONTRAST:  25m GADAVIST GADOBUTROL 1 MMOL/ML IV SOLN COMPARISON:  01/29/2019 abdominal ultrasound. 01/28/2019 abdominopelvic CT. 07/27/2018 abdominal MRI. FINDINGS: Mild motion degradation throughout. Lower chest: Bibasilar atelectasis. Normal heart size. Trace left pleural fluid. Hepatobiliary: Moderate cirrhosis. Dominant right hepatic lobe cyst. No suspicious liver lesion. The gallstones and sludge are more apparent on ultrasound. There is primarily medial gallbladder wall thickening including at 8 mm on 36/8. New since the prior MRI. Mild mucosal enhancement throughout the gallbladder, without surrounding hyperemia. No intra or extrahepatic biliary duct dilatation. The common duct measures on the order of 4 mm on 82/6. No  choledocholithiasis. Pancreas: Normal pancreas for age, without duct dilatation or acute  inflammation. Spleen:  Borderline splenomegaly at 14.7 cm craniocaudal. Adrenals/Urinary Tract: Normal adrenal glands. Normal kidneys, without hydronephrosis. Stomach/Bowel: Gastric antral underdistention. Normal caliber of abdominal bowel loops. Vascular/Lymphatic: Aortic atherosclerosis. Patent portal, splenic, hepatic veins. Prominent porta hepatis nodes are likely reactive and similar. Other: Small volume abdominal ascites, increased compared to the prior CT. For example, new fluid is seen surrounding the left hepatic lobe and extending in the lesser sac, including on 34/3. Musculoskeletal: No acute osseous abnormality. IMPRESSION: 1. Mildly motion degraded exam. 2. No biliary duct dilatation or choledocholithiasis. 3. Asymmetric gallbladder wall thickening in the setting of stones/sludge on ultrasound. Nonspecific in the setting of cirrhosis. Cannot exclude a component of chronic cholecystitis. 4. Cirrhosis. 5. Trace left pleural fluid. 6. Increase in small volume abdominal ascites. Electronically Signed   By: Abigail Miyamoto M.D.   On: 01/31/2019 18:57    Assessment / Plan: 64.  73 year old male with alcoholic cirrhosis, jaundice, RUQ pain and leukocytosis. A diagnostic paracentesis (133m peritoneal fluid was removed) 12/28 was positive for SBP with a PMN count greater than 45,000.  He was previously on Zosyn which was changed to Augmentin po 12/28.  The hospitalist consulted ID and switched him back to Zosyn 3.375 mg IV every 8 hours as he was too sedated to take po Augmentin. He received Albumin 100 g IV x1 dose post paracentesis. MRCP/MRI 12/28 showed asymmetric gallbladder wall thickening, no evidence of choledocholithiasis.  LFTs and T. Bili levels drifting downward.  Alk phos 267.  AST 48.  ALT 28.  Total bilirubin 10.8 down from 12.3. Leukocytosis improving with WBC 19.8. He is afebrile.  -Augmentin recommended  by Dr. NSilverio Decamp further antibiotic recommendations per ID -ID consult if not already initiated by the hospitalist  -Await peritoneal fluid culture results  2. Hepatic encephalopathy vs sedation administered per  CIWA protocol.  -continue lactulose po when patient is awake, may require lactulose enema  -repeat ammonia level -monitor neuro/respiratory status closely  2.  Thrombocytopenia secondary to cirrhosis. PLT 108.  3.  Coagulopathy.  INR 1.4 >> 1.3.  -Vitamin K '5mg'$  po x 1 today (when patient awake enough to take po meds)  4.  Right liver cyst  5.  Hyponatremia, improving.  Sodium 134.  Further recommendations per Dr. NSilverio Decamp   ADDENDUM: Zosyn IV  changed to Unasyn IV  as recommended by Dr. NSilverio Decamp Lactulose 200gm enemas Q hrs 8hrs ordered and po lactulose dc'd.    Principal Problem:   Acute cholecystitis Active Problems:   GERD   Type 2 diabetes mellitus with complication, with long-term current use of insulin (HCC)   Essential hypertension   Transaminitis   COVID-19 virus infection   Ascites   Jaundice   Leucocytosis     LOS: 3 days   CNoralyn Pick 02/01/2019, 10:59 AM    Attending physician's note   I have taken an interval history, reviewed the chart and examined the patient. I agree with the Advanced Practitioner's note, impression and recommendations.   Worsening mental status.  He is unarousable.  Hepatic encephalopathy exacerbated secondary to spontaneous bacterial peritonitis Lactulose enema every 8 hours and start oral lactulose 20 cc every 6 hours once mental status improves  Switched to IV Unasyn.  He has been on IV Zosyn for past 5 days with progressive worsening leukocytosis Blood culture and ascites fluid culture pending  No biliary obstruction or CBD stone on MRI.  Gallbladder wall thickening mild otherwise no other significant abnormality.  KCoy Saunas  Silverio Decamp , [place]

## 2019-02-01 NOTE — Progress Notes (Signed)
OT Cancellation Note  Patient Details Name: Patrick Kidd MRN: [id number] DOB: 11/03/1945   Cancelled Treatment:    Reason Eval/Treat Not Completed: Other (comment).  Pt was agitated earlier, then sleepy.  Will check back tomorrow.  Giordano Getman 02/01/2019, 2:22 PM  Karsten Ro, OTR/L [facility] 02/01/2019

## 2019-02-01 NOTE — Progress Notes (Signed)
Pt transferred to 1239 per MD orders. PT will be started on Precedix drip in stepdown unit. Family was notified of transfer and all questions were answered. Pt belongings and chart sent with pt. No questions or concerns at this time.

## 2019-02-01 NOTE — Progress Notes (Signed)
PROGRESS NOTE    Patrick Kidd  ZDG:[id number] DOB: 04/19/1945 DOA: 01/28/2019 PCP: [name], MD    Brief Narrative:  Patrick Kidd is a 73 year old Caucasian male with past medical history remarkable for type 2 diabetes mellitus, EtOH cirrhosis, portal hypertension, PUD, BPH, recent Covid-19 infection (01/03/2019)  that was treated with steroids who presented to the ED with right-sided epigastric abdominal pain.  He also reported generalized weakness, fatigue and yellowing to his skin.  He has been drinking vodka, reports roughly sixteen 8 ounce drinks per day.  Denies any nausea, vomiting, diarrhea or hematochezia.  No blood in stool.  In the ED, labs remarkable for transaminitis with obstructive pattern with alk phosphatase of 316, total bilirubin 11.2, indirect bilirubin 4.5, procalcitonin 3.9, CRP 27.2, the BC count 15.8, hemoglobin 13.7.  CT abdomen/pelvis with mildly distended gallbladder with surrounding inflammatory changes, small amount of pericholecystic fluid, findings suggestive of cirrhosis, and possible underlying cholecystitis.  Patient was referred for admission for possible treatment of acute cholecystitis.   Assessment & Plan:   Principal Problem:   Acute cholecystitis Active Problems:   GERD   Type 2 diabetes mellitus with complication, with long-term current use of insulin (HCC)   Essential hypertension   Transaminitis   COVID-19 virus infection   Ascites   Jaundice   Leucocytosis  Abdominal pain secondary to acute cholecystitis vs developing spontaneous bacterial peritonitis CT abdomen/pelvis with findings suggestive of acute cholecystitis and right upper quadrant ultrasound notable for layering sludge/gallstones with mild gallbladder wall thickening likely secondary to mild acute cholecystitis; common bile duct diameter 3.7 mm. Lipase 13.  Per general surgery, patient not a candidate for surgical intervention secondary to child's class C cirrhosis,  and signed off on 01/31/2019.  Underwent diagnostic paracentesis by IR on 01/31/2019 with 100 mL of turbid amber-colored fluid removed.  Peritoneal fluid analysis remarkable for 50 K nucleated cells with 90 neutrophils consistent with SBP.  MR/MRCP abdomen with no biliary duct dilation, no choledocholithiasis; notable asymmetric gallbladder wall thickening, cirrhosis with increasing small volume ascites. --[facility] following; appreciate assistance --WBC 15.4-->15.8-->12.5-->21.5-->27.4--19.8 --PCT 3.89-->2.76-->2.10 --Abx changed from Zosyn to Augmentin 12/28 per GI recs, but now cannot tolerate PO given severe withdrawal symptoms; d/w ID, Dr. Tommy Medal; recommend restart Zosyn and follow peritoneal fluid culture results --Follow CBC daily  Decompensated EtOH cirrhosis, acute Portal hypertension Etiology likely recent history of Covid-19 with steroid use versus uncontrolled diabetes versus continued EtOH abuse.  CT abdomen with hepatomegaly, cirrhosis and small perihepatic ascites. MR/MRCP abdomen 12/28 with no biliary duct dilation, no choledocholithiasis; notable asymmetric gallbladder wall thickening, cirrhosis with increasing small volume ascites. --GI following as above, no indication for steriods at this time --MELD-Na = 28 on admission, today = 22 --MDF = 17.7 --ammonia level 31 --AST 46-->51-->56-->48 --ALT 34-->37-->31-->28 --AP 316-->301-->253-->267 --Tbili 15.7-->15.6-->13.6-->10.8 --INR 1.3-->1.3-->1.3-->1.4 --Continue lactulose 20g PO BID, goal 2-3 soft bowel movements daily; if unable to tolerate PO; may need to change to enema --completed 3 doses Vitamin K 50m PO daily on 01/31/2019 --Salt restricted diet --Continue supportive care, trend CBC, LFTs, PT/INR  EtOH abuse disorder with severe withdrawals The evening of 01/31/2019, patient now more confused, agitated with increased heart rate consistent with alcohol withdrawal symptoms. --CIWAA protocol with symptom triggered  Ativan --Thiamine, folate acid, multivitamin --Supportive care --If uncontrolled with Ativan, may consider transfer to stepdown/ICU for Precedex gtt  GERD: Continue PPI  T2DM, NIDDM Hemoglobin A1c 8.9 01/31/2019. On metformin outpatient --consistent carbohydrate diet --Blood sugars elevated this  morning --continue Lantus to 25u [place] daily --Insulin sliding scale for further coverage --CBGs q4h  Hyponatremia Likely multifactorial with dehydration from poor oral intake, pseudohyponatremia secondary to hyperglycemia and persistent alcohol use disorder --Na 124-->131-->132-->134 today --Discontinued IV fluids --Started on diet as above --Follow BMP daily  Recent Covid-19 infection Diagnosed on 01/03/2019, treated with steroids.  Currently oxygenating well on room air. --Discontinue airborne/contact isolation precautions as has been greater than 3 weeks since infection  Morbid obesity Body mass index is 31.31 kg/m. Discussed with patient needs for aggressive lifestyle changes and weight loss as this complicates all facets of care.  Weakness/debility: --PT recommends SNF versus home health with 24-hour assistance, will continue therapy efforts while inpatient   DVT prophylaxis: SCD's Code Status: Full Code Family Communication:  Disposition Plan: Remain inpatient, IV antibiotics, diagnostic paracentesis followed by MRI/MRCP abdomen today, further dependent on clinical course   Consultants:   [facility]  General Surgery - Dr. Ninfa Linden, Dr. Brantley Stage - signed off 01/31/2019  Infectious disease - Case discussed with Dr. Tommy Medal on 02/01/2019  Procedures:   none  Antimicrobials:   Zosyn 12/26 - 12/28; 12/29>>  Augmentin 12/28 - 12/28   Subjective: Patient seen and examined bedside, confused and altered.  Overnight started having increased agitation, confusion and tachycardia all consistent with alcohol withdrawal syndrome.  Has required several doses of Ativan, last  dose of IV Ativan 4 mg this morning at 7 AM.  Unable obtain any further ROS from patient secondary to his current mental status.  Discussed with ID this morning regarding antibiotics given his appeared resistance to Zosyn with increasing white count which was transitioned to Augmentin yesterday; but unable to tolerate p.o. at this time.  ID recommends changing back to Zosyn and await peritoneal fluid cultures for further antibiotic direction.  No other concerns per nursing at this time other than his alcohol withdrawal symptoms.  Objective: Vitals:   02/01/19 0229 02/01/19 0310 02/01/19 0500 02/01/19 0557  BP: (!) 155/81 (!) 152/73  (!) 167/85  Pulse: (!) 110 (!) 112  (!) 126  Resp: 20   (!) 24  Temp:      TempSrc:      SpO2: 93% 98%  98%  Weight:   110.6 kg   Height:        Intake/Output Summary (Last 24 hours) at 02/01/2019 0944 Last data filed at 02/01/2019 0200 Gross per 24 hour  Intake 469.29 ml  Output 600 ml  Net -130.71 ml   Filed Weights   01/30/19 0422 01/31/19 0500 02/01/19 0500  Weight: 110.7 kg 112.6 kg 110.6 kg    Examination:  General exam: Calm, sleeping, confused Respiratory system: Clear to auscultation. Respiratory effort normal.  On 2 L nasal cannula oxygenating 98%. Cardiovascular system: S1 & S2 heard, RRR. No JVD, murmurs, rubs, gallops or clicks. No pedal edema. Gastrointestinal system: Abdomen is protuberant with fluid wave, soft with epigastric tenderness with some guarding, no rebound; no tenderness right upper quadrant. Normal bowel sounds heard. Central nervous system: Confused Extremities: Moves all extremities independently Skin: No rashes, lesions or ulcers Psychiatry: Confused    Data Reviewed: I have personally reviewed following labs and imaging studies  CBC: Recent Labs  Lab 01/28/19 1833 01/29/19 0500 01/29/19 0626 01/30/19 0332 01/31/19 0600 02/01/19 0244  WBC 15.4* 15.8* 12.5* 21.5* 27.4* 19.8*  NEUTROABS 12.2*  --   --   --    --   --   HGB 12.8* 13.7 13.4 11.9* 12.3* 11.2*  HCT 36.4* 38.9* 38.9* 35.1* 35.7* 33.0*  MCV 96.0 95.8 97.3 99.2 98.3 99.4  PLT 97* 151 133* 125* 106* 297*   Basic Metabolic Panel: Recent Labs  Lab 01/28/19 1833 01/29/19 0626 01/30/19 0332 01/31/19 0600 02/01/19 0244  NA 124* 128* 131* 132* 134*  K 4.7 3.8 4.5 3.8 3.6  CL 86* 88* 89* 94* 93*  CO2 _0 GLUCOSE 514* 300* 272* 203* 222*  BUN 29* 28* 31* 32* 31*  CREATININE 1.15 1.04 0.72 0.97 0.94  CALCIUM 8.3* 8.2* 7.5* 7.7* 8.1*  MG 1.5*  --   --   --   --    GFR: Estimated Creatinine Clearance: 91.3 mL/min (by C-G formula based on SCr of 0.94 mg/dL). Liver Function Tests: Recent Labs  Lab 01/28/19 1833 01/29/19 0626 01/30/19 0332 01/31/19 0600 02/01/19 0244  AST 46* 51* 56* 63* 48*  ALT 34 37 31 37 28  ALKPHOS 316* 301* 253* 304* 267*  BILITOT 15.7* 15.6* 13.6* 12.3* 10.8*  PROT 6.0* 6.1* 5.9* 5.5* 6.2*  ALBUMIN 2.1* 2.1* 2.0* 2.0* 3.3*   Recent Labs  Lab 01/30/19 0332  LIPASE 13   Recent Labs  Lab 01/29/19 1143  AMMONIA 31   Coagulation Profile: Recent Labs  Lab 01/28/19 1833 01/29/19 0500 01/30/19 0332 01/31/19 0600 02/01/19 0244  INR 1.3* 1.3* 1.3* 1.3* 1.4*   Cardiac Enzymes: Recent Labs  Lab 01/29/19 0500  CKTOTAL 12*  CKMB 1.6   BNP (last 3 results) No results for input(s): PROBNP in the last 8760 hours. HbA1C: Recent Labs    01/31/19 0600  HGBA1C 8.9*   CBG: Recent Labs  Lab 01/31/19 1628 01/31/19 2036 02/01/19 0018 02/01/19 0452 02/01/19 0743  GLUCAP 209* 237* 241* 206* 171*   Lipid Profile: No results for input(s): CHOL, HDL, LDLCALC, TRIG, CHOLHDL, LDLDIRECT in the last 72 hours. Thyroid Function Tests: No results for input(s): TSH, T4TOTAL, FREET4, T3FREE, THYROIDAB in the last 72 hours. Anemia Panel: Recent Labs    01/31/19 0600  IRON 17*   Sepsis Labs: Recent Labs  Lab 01/29/19 0626 01/31/19 0600 02/01/19 0244  PROCALCITON 3.89 2.76 2.10     Recent Results (from the past 240 hour(s))  Culture, blood (routine x 2)     Status: None (Preliminary result)   Collection Time: 01/29/19 12:23 AM   Specimen: BLOOD  Result Value Ref Range Status   Specimen Description   Final    BLOOD RIGHT ANTECUBITAL Performed at [facility], [place] [address] [place], [place] [address]    Special Requests   Final    BOTTLES DRAWN AEROBIC AND ANAEROBIC Blood Culture adequate volume Performed at [place] [address], [place], [place] [address]    Culture   Final    NO GROWTH 2 DAYS Performed at [facility] [address] [place], [place] [address]    Report Status PENDING  Incomplete  Culture, blood (routine x 2)     Status: None (Preliminary result)   Collection Time: 01/30/19  3:32 AM   Specimen: BLOOD  Result Value Ref Range Status   Specimen Description   Final    BLOOD LEFT HAND Performed at [place] [address] [place], [place] [address]    Special Requests   Final    BOTTLES DRAWN AEROBIC ONLY Blood Culture adequate volume Performed at [facility] [place] [address], [place], [place] [address]    Culture  Final    NO GROWTH 1 DAY Performed at [facility], [place] [address] [place], [place] [address]    Report Status PENDING  Incomplete  Aerobic/Anaerobic Culture (surgical/deep wound)     Status: None (Preliminary result)   Collection Time: 01/31/19  3:25 PM   Specimen: Abdomen; Peritoneal Fluid  Result Value Ref Range Status   Specimen Description   Final    PERITONEAL Performed at [facility] [address] [place], [place] [address]    Special Requests   Final    NONE Performed at [facility], [place] [address] [place], [place] [address]    Gram Stain   Final    ABUNDANT WBC PRESENT, PREDOMINANTLY PMN NO ORGANISMS SEEN    Culture   Final    NO GROWTH < 24 HOURS Performed  at [place] [address] [place], [place] [address]    Report Status PENDING  Incomplete         Radiology Studies: MR 3D Recon At Scanner  Result Date: 01/31/2019 CLINICAL DATA:  Alcoholic cirrhosis. Jaundice. Rule out intra and extrahepatic biliary duct obstruction. Diabetes. Portal hypertension. Recent COVID infection 1 month ago. EXAM: MRI ABDOMEN WITHOUT AND WITH CONTRAST (INCLUDING MRCP) TECHNIQUE: Multiplanar multisequence MR imaging of the abdomen was performed both before and after the administration of intravenous contrast. Heavily T2-weighted images of the biliary and pancreatic ducts were obtained, and three-dimensional MRCP images were rendered by post processing. CONTRAST:  53m GADAVIST GADOBUTROL 1 MMOL/ML IV SOLN COMPARISON:  01/29/2019 abdominal ultrasound. 01/28/2019 abdominopelvic CT. 07/27/2018 abdominal MRI. FINDINGS: Mild motion degradation throughout. Lower chest: Bibasilar atelectasis. Normal heart size. Trace left pleural fluid. Hepatobiliary: Moderate cirrhosis. Dominant right hepatic lobe cyst. No suspicious liver lesion. The gallstones and sludge are more apparent on ultrasound. There is primarily medial gallbladder wall thickening including at 8 mm on 36/8. New since the prior MRI. Mild mucosal enhancement throughout the gallbladder, without surrounding hyperemia. No intra or extrahepatic biliary duct dilatation. The common duct measures on the order of 4 mm on 82/6. No choledocholithiasis. Pancreas: Normal pancreas for age, without duct dilatation or acute inflammation. Spleen:  Borderline splenomegaly at 14.7 cm craniocaudal. Adrenals/Urinary Tract: Normal adrenal glands. Normal kidneys, without hydronephrosis. Stomach/Bowel: Gastric antral underdistention. Normal caliber of abdominal bowel loops. Vascular/Lymphatic: Aortic atherosclerosis. Patent portal, splenic, hepatic veins. Prominent porta hepatis nodes are likely reactive and similar. Other: Small  volume abdominal ascites, increased compared to the prior CT. For example, new fluid is seen surrounding the left hepatic lobe and extending in the lesser sac, including on 34/3. Musculoskeletal: No acute osseous abnormality. IMPRESSION: 1. Mildly motion degraded exam. 2. No biliary duct dilatation or choledocholithiasis. 3. Asymmetric gallbladder wall thickening in the setting of stones/sludge on ultrasound. Nonspecific in the setting of cirrhosis. Cannot exclude a component of chronic cholecystitis. 4. Cirrhosis. 5. Trace left pleural fluid. 6. Increase in small volume abdominal ascites. Electronically Signed   By: KAbigail MiyamotoM.D.   On: 01/31/2019 18:57   [place]acentesis  Result Date: 01/31/2019 INDICATION: Patient with history of alcoholic cirrhosis, portal hypertension, recent COVID-19 infection, abdominal pain, ascites. Request made for diagnostic paracentesis up to 100 cc. EXAM: ULTRASOUND GUIDED DIAGNOSTIC PARACENTESIS MEDICATIONS: None COMPLICATIONS: None immediate. PROCEDURE: Informed written consent was obtained from the patient after a discussion of the risks, benefits and alternatives to treatment. A timeout was performed prior to the initiation of the procedure. Initial ultrasound scanning demonstrates a small amount  of ascites within the right upper to mid abdominal quadrant. The right upper to mid abdomen was prepped and draped in the usual sterile fashion. 1% lidocaine was used for local anesthesia. Following this, a 19 gauge, 7-cm, Yueh catheter was introduced. An ultrasound image was saved for documentation purposes. The paracentesis was performed. The catheter was removed and a dressing was applied. The patient tolerated the procedure well without immediate post procedural complication. FINDINGS: A total of approximately 100 cc of turbid, amber fluid was removed. Samples were sent to the laboratory as requested by the clinical team. IMPRESSION: Successful ultrasound-guided diagnostic  paracentesis yielding 100 cc of peritoneal fluid. Read by: Rowe Robert, PA-C Electronically Signed   By: Sandi Mariscal M.D.   On: 01/31/2019 15:10   MR ABDOMEN MRCP W WO CONTAST  Result Date: 01/31/2019 CLINICAL DATA:  Alcoholic cirrhosis. Jaundice. Rule out intra and extrahepatic biliary duct obstruction. Diabetes. Portal hypertension. Recent COVID infection 1 month ago. EXAM: MRI ABDOMEN WITHOUT AND WITH CONTRAST (INCLUDING MRCP) TECHNIQUE: Multiplanar multisequence MR imaging of the abdomen was performed both before and after the administration of intravenous contrast. Heavily T2-weighted images of the biliary and pancreatic ducts were obtained, and three-dimensional MRCP images were rendered by post processing. CONTRAST:  32m GADAVIST GADOBUTROL 1 MMOL/ML IV SOLN COMPARISON:  01/29/2019 abdominal ultrasound. 01/28/2019 abdominopelvic CT. 07/27/2018 abdominal MRI. FINDINGS: Mild motion degradation throughout. Lower chest: Bibasilar atelectasis. Normal heart size. Trace left pleural fluid. Hepatobiliary: Moderate cirrhosis. Dominant right hepatic lobe cyst. No suspicious liver lesion. The gallstones and sludge are more apparent on ultrasound. There is primarily medial gallbladder wall thickening including at 8 mm on 36/8. New since the prior MRI. Mild mucosal enhancement throughout the gallbladder, without surrounding hyperemia. No intra or extrahepatic biliary duct dilatation. The common duct measures on the order of 4 mm on 82/6. No choledocholithiasis. Pancreas: Normal pancreas for age, without duct dilatation or acute inflammation. Spleen:  Borderline splenomegaly at 14.7 cm craniocaudal. Adrenals/Urinary Tract: Normal adrenal glands. Normal kidneys, without hydronephrosis. Stomach/Bowel: Gastric antral underdistention. Normal caliber of abdominal bowel loops. Vascular/Lymphatic: Aortic atherosclerosis. Patent portal, splenic, hepatic veins. Prominent porta hepatis nodes are likely reactive and similar.  Other: Small volume abdominal ascites, increased compared to the prior CT. For example, new fluid is seen surrounding the left hepatic lobe and extending in the lesser sac, including on 34/3. Musculoskeletal: No acute osseous abnormality. IMPRESSION: 1. Mildly motion degraded exam. 2. No biliary duct dilatation or choledocholithiasis. 3. Asymmetric gallbladder wall thickening in the setting of stones/sludge on ultrasound. Nonspecific in the setting of cirrhosis. Cannot exclude a component of chronic cholecystitis. 4. Cirrhosis. 5. Trace left pleural fluid. 6. Increase in small volume abdominal ascites. Electronically Signed   By: KAbigail MiyamotoM.D.   On: 01/31/2019 18:57        Scheduled Meds: . folic acid  1 mg Oral Daily  . insulin aspart  0-9 Units Subcutaneous Q4H  . insulin glargine  25 Units Subcutaneous Daily  . lactulose  20 g Oral BID  . LORazepam  0-4 mg Intravenous Q12H  . multivitamin with minerals  1 tablet Oral Daily  . pantoprazole  40 mg Oral BID  . sodium chloride flush  3 mL Intravenous Q12H  . thiamine  100 mg Oral Daily   Or  . thiamine  100 mg Intravenous Daily   Continuous Infusions: . sodium chloride 250 mL (01/30/19 1420)  . albumin human 25 g (01/31/19 1024)  . piperacillin-tazobactam (ZOSYN)  IV  LOS: 3 days    Time spent: 40 minutes spent on chart review, discussion with nursing staff, consultants, updating family and interview/physical exam; more than 50% of that time was spent in counseling and/or coordination of care.    Patrick Kidd J [place] DO [facility] 02/01/2019, 9:44 AM

## 2019-02-01 NOTE — Progress Notes (Signed)
Upon entering room, pt was pulling off all tele electrodes, hospital gown was off and pt was actively trying to get out of bed stating " I need to get out of here". RN Mechele Claude was called for assistant to reorient pt back to safety. Pt was noncompliant and was physically aggressive towards RNs still wanting to leave bed. Pt appeared distressed and was wheezing from airway. 2mg  of ativan was given to pt and both RN Lattie Haw and joanne assisted to help get patient back to comfortable position. 2L of oxygen [place] was placed on pt, cardiac leads replaced and gown put back on. Vital signs was taken and pt sat 98% on 2L. Pt is currently resting on left side, bed in lowest position  With alarm on. On call provider notified of situation. Will continue to closely monitor.

## 2019-02-01 NOTE — Care Management Important Message (Signed)
Important Message  Patient Details IM Letter given to Gabriel Earing RN Case Manager to present to the Patient Name: Patrick Kidd MRN: [id number] Date of Birth: [birth year]   Medicare Important Message Given:  Yes     Kerin Salen 02/01/2019, 11:18 AM

## 2019-02-01 NOTE — Consult Note (Signed)
NAME:  Patrick Kidd, MRN:  [id number], DOB:  Feb 29, 1945, LOS: 3 ADMISSION DATE:  01/28/2019, CONSULTATION DATE:  02/01/2019  REFERRING MD:  [place] Eric J, DO, CHIEF COMPLAINT:  Alcohol withdrawal  History of present illness   Patrick Kidd is a 73 year old gentleman with a history of decompensated cirrhosis with ascites secondary to alcohol presented on 12/25 with jaundice and abdominal pain.  He drinks 16 ounces of vodka per day at least.  He was also recently Covid positive on 11/30 resented to the ED with shortness of breath, he was treated with prednisone as an outpatient.  He had ascites on admission, and there was concern for SBP which was confirmed on paracentesis with a PMN count greater than 45,000.  Over the last 24 hours he has had worsening agitated delirium secondary to alcohol withdrawal and since this morning has received over 9 mg of Ativan.  PCCM is being consulted to assist with alcohol withdrawal and agitated delirium.  Consults:  PCCM  Procedures:  Paracentesis 12/28 >> SBP Echocardiogram 12/25 shows grade 1 diastolic dysfunction  Micro Data:  12/28 peritoneal fluid culture negative 12/26 blood cultures negative 12/27 blood cultures negative  Antimicrobials:  Zosyn >>12/25>>12/28 Augmentin >> 12/28 >> Flagyl>>12/29  Objective   Blood pressure (!) 159/83, pulse (!) 107, temperature (!) 100.5 F (38.1 C), resp. rate (!) 30, height '6\' 2"'$  (1.88 m), weight 110.6 kg, SpO2 95 %.        Intake/Output Summary (Last 24 hours) at 02/01/2019 1536 Last data filed at 02/01/2019 1245 Gross per 24 hour  Intake 297.29 ml  Output 850 ml  Net -552.71 ml   Filed Weights   01/30/19 0422 01/31/19 0500 02/01/19 0500  Weight: 110.7 kg 112.6 kg 110.6 kg    Examination: General: jaundiced, somnolent HENT: scleral icterus Lungs: mild expiratory wheezing Cardiovascular: Tachycardic, regular, no mrg Abdomen: distended with ascites, soft, diffusely ttp Extremities: no  edema Neuro: not following commands MSK: jaundiced  Assessment & Plan:  Patrick Kidd is a 73 year old gentleman with a history of decompensated alcoholic cirrhosis who presents with:  Acute encephalopathy secondary to alcohol withdrawal syndrome with delirium, hepatic encephalopathy Spontaneous bacterial peritonitis Decompensated cirrhosis secondary to alcohol use Hyperbilirubinemia, obstructive pattern Coagulopathy of liver disease  Given high Ativan requirements, will initiate Precedex drip.  Can continue Ativan as needed. Also treat his hepatic encephalopathy.  Continue lactulose, will add zinc and rifaximin. If he is not tolerating lactulose enemas, he will need an NG tube to get these medications scheduled.  For his SBP he is currently on Flagyl and Unasyn.  Would consider monotherapy with ceftriaxone. Apparently ID has been consulted.  Vitamin K oral is generally not helpful in coagulopathy of liver disease especially reducing INR less than 1.5. would further unless signs of bleeding.   Best practice:  Diet: N.p.o. for mental status, okay to advance diet if alert. Pain/Anxiety/Delirium protocol (if indicated): Limit benzos DVT prophylaxis: heparin sub cu. Hold if bleeding or platelets less than 50k GI prophylaxis: Pantoprazole Glucose control: Controlled Mobility: Out of bed as tolerated, currently agitated delirium Code Status: Full Family Communication: per primary Disposition: Step Down  Labs   CBC: Recent Labs  Lab 01/28/19 1833 01/29/19 0500 01/29/19 0626 01/30/19 0332 01/31/19 0600 02/01/19 0244  WBC 15.4* 15.8* 12.5* 21.5* 27.4* 19.8*  NEUTROABS 12.2*  --   --   --   --   --   HGB 12.8* 13.7 13.4 11.9* 12.3* 11.2*  HCT 36.4* 38.9* 38.9* 35.1*  35.7* 33.0*  MCV 96.0 95.8 97.3 99.2 98.3 99.4  PLT 97* 151 133* 125* 106* 108*    Basic Metabolic Panel: Recent Labs  Lab 01/28/19 1833 01/29/19 0626 01/30/19 0332 01/31/19 0600 02/01/19 0244  NA 124* 128*  131* 132* 134*  K 4.7 3.8 4.5 3.8 3.6  CL 86* 88* 89* 94* 93*  CO2 24 26 23 23 27   GLUCOSE 514* 300* 272* 203* 222*  BUN 29* 28* 31* 32* 31*  CREATININE 1.15 1.04 0.72 0.97 0.94  CALCIUM 8.3* 8.2* 7.5* 7.7* 8.1*  MG 1.5*  --   --   --   --    GFR: Estimated Creatinine Clearance: 91.3 mL/min (by C-G formula based on SCr of 0.94 mg/dL). Recent Labs  Lab 01/29/19 0626 01/30/19 0332 01/31/19 0600 02/01/19 0244  PROCALCITON 3.89  --  2.76 2.10  WBC 12.5* 21.5* 27.4* 19.8*    Liver Function Tests: Recent Labs  Lab 01/28/19 1833 01/29/19 0626 01/30/19 0332 01/31/19 0600 02/01/19 0244  AST 46* 51* 56* 63* 48*  ALT 34 37 31 37 28  ALKPHOS 316* 301* 253* 304* 267*  BILITOT 15.7* 15.6* 13.6* 12.3* 10.8*  PROT 6.0* 6.1* 5.9* 5.5* 6.2*  ALBUMIN 2.1* 2.1* 2.0* 2.0* 3.3*   Recent Labs  Lab 01/30/19 0332  LIPASE 13   Recent Labs  Lab 01/29/19 1143 02/01/19 1211  AMMONIA 31 35    ABG No results found for: PHART, PCO2ART, PO2ART, HCO3, TCO2, ACIDBASEDEF, O2SAT   Coagulation Profile: Recent Labs  Lab 01/28/19 1833 01/29/19 0500 01/30/19 0332 01/31/19 0600 02/01/19 0244  INR 1.3* 1.3* 1.3* 1.3* 1.4*    Cardiac Enzymes: Recent Labs  Lab 01/29/19 0500  CKTOTAL 12*  CKMB 1.6    HbA1C: Hgb A1c MFr Bld  Date/Time Value Ref Range Status  01/31/2019 06:00 AM 8.9 (H) 4.8 - 5.6 % Final    Comment:    (NOTE) Pre diabetes:          5.7%-6.4% Diabetes:              >6.4% Glycemic control for   <7.0% adults with diabetes   11/22/2018 10:30 AM 6.7 (H) 4.6 - 6.5 % Final    Comment:    Glycemic Control Guidelines for People with Diabetes:Non Diabetic:  <6%Goal of Therapy: <7%Additional Action Suggested:  >8%     CBG: Recent Labs  Lab 01/31/19 2036 02/01/19 0018 02/01/19 0452 02/01/19 0743 02/01/19 1146  GLUCAP 237* 241* 206* 171* 148*    Review of Systems:   Unable to obtain due to mental status.   Past Medical History  He,  has a past medical history  of Acrophobia, Alcoholic cirrhosis ([place]) (10/04/2015), Anemia, Arthritis, BPH associated with nocturia, Cataract, Chronic cough, Diabetes mellitus without complication ([place]), Diverticulosis (07-03-2010), Duodenal ulcer (2017), Fallen arches, GERD (gastroesophageal reflux disease), Gout, Granuloma annulare, adenomatous colonic polyps (multiple), Hydrocele (2011), Liver cyst, Liver lesion, Nonspecific elevation of levels of transaminase or lactic acid dehydrogenase (LDH), Obesity, Peripheral neuropathy, Plantar fasciitis, Portal hypertension ([place]) (2017), Prostate cancer ([place]), Right shoulder pain (11/2017), Sleep apnea, and Wears reading eyeglasses.   Surgical History    Past Surgical History:  Procedure Laterality Date  . CATARACT EXTRACTION, BILATERAL  12/2011    Dr Gershon Crane  . COLONOSCOPY  2017  . COLONOSCOPY W/ POLYPECTOMY  07/03/2010   2 adenomas, diverticulosis on right. Dr [name]  . CYSTOSCOPY N/A 03/15/2018   Procedure: CYSTOSCOPY FLEXIBLE;  Surgeon: [name], MD;  Location:  [facility];  Service: Urology;  Laterality: N/A;  NO SEEDS FOUND IN BLADDER  . ESOPHAGOGASTRODUODENOSCOPY (EGD) WITH PROPOFOL N/A 01/08/2016   Procedure: ESOPHAGOGASTRODUODENOSCOPY (EGD) WITH PROPOFOL;  Surgeon: [name], MD;  Location: [facility];  Service: Endoscopy;  Laterality: N/A;  . FLEXIBLE SIGMOIDOSCOPY  2000  . RADIOACTIVE SEED IMPLANT N/A 03/15/2018   Procedure: RADIOACTIVE SEED IMPLANT/BRACHYTHERAPY IMPLANT;  Surgeon: [name], MD;  Location: [facility];  Service: Urology;  Laterality: N/A;   72 SEEDS IMPLANTED  . SIGMOIDOSCOPY    . SPACE OAR INSTILLATION N/A 03/15/2018   Procedure: SPACE OAR INSTILLATION;  Surgeon: [name], MD;  Location: [facility];  Service: Urology;  Laterality: N/A;  . WISDOM TOOTH EXTRACTION       Social History   reports that he quit smoking about 48 years ago. His smoking use included cigarettes.  He has a 12.00 pack-year smoking history. He has never used smokeless tobacco. He reports current alcohol use of about 28.0 standard drinks of alcohol per week. He reports that he does not use drugs.   Family History   His family history includes Leukemia in his father; Lung cancer in his mother. There is no history of Diabetes, Stroke, Heart disease, Colon cancer, Colon polyps, Esophageal cancer, Rectal cancer, or Stomach cancer.   Allergies No Known Allergies   Home Medications  Prior to Admission medications   Medication Sig Start Date End Date Taking? Authorizing Provider  cyclobenzaprine (FLEXERIL) 5 MG tablet Take 1 to 2 tabs at bedtime if needed. Patient taking differently: Take 5 mg by mouth 3 (three) times daily as needed for muscle spasms.  01/10/19  Yes [name], MD  gabapentin (NEURONTIN) 300 MG capsule Take 1 capsule (300 mg total) by mouth 2 (two) times daily. Patient taking differently: Take 300 mg by mouth daily.  08/24/18  Yes Jaffe, Adam R, DO  ibuprofen (ADVIL) 200 MG tablet Take 400 mg by mouth every 6 (six) hours as needed for moderate pain.   Yes [provider]  metFORMIN (GLUCOPHAGE-XR) 500 MG 24 hr tablet TAKE 1 TABLET(500 MG) BY MOUTH DAILY WITH BREAKFAST Patient taking differently: Take 500 mg by mouth daily with breakfast.  04/20/18  Yes [name], MD  pantoprazole (PROTONIX) 40 MG tablet TAKE 1 TABLET(40 MG) BY MOUTH TWICE DAILY Patient taking differently: Take 40 mg by mouth 2 (two) times daily.  03/17/18  Yes Gatha Mayer, MD  blood glucose meter kit and supplies KIT Use to test blood sugar once daily. DX E11.8 12/21/17   [name], MD  Blood Glucose Monitoring Suppl (FREESTYLE FREEDOM LITE) w/Device KIT Use to check blood sugars 2 times daily 01/14/16   [name], FNP  glucose blood (COOL BLOOD GLUCOSE TEST STRIPS) test strip Use to check blood sugar daily. DX: E11.8 12/21/17   [name], MD  Lancets (ACCU-CHEK MULTICLIX)  lancets Use to check sugar daily. DX: E11.8 12/21/17   [name], MD  mirabegron ER (MYRBETRIQ) 25 MG TB24 tablet Take 1 tablet (25 mg total) by mouth daily. Patient not taking: Reported on 01/28/2019 01/10/19   [name], MD     Critical care time   The patient is critically ill with multiple organ systems failure and requires high complexity decision making for assessment and support, frequent evaluation and titration of therapies, application of advanced monitoring technologies and extensive interpretation of multiple databases.  Critical Care Time devoted to patient care services described in this note is 57 minutes. This time reflects my personal involvement in patient care. This critical care time does not reflect separately billable procedures or procedure time, teaching time or supervisory time of PA/NP/Med student/Med Resident etc but could involve care discussion time.  [facility] 02/01/2019 3:36 PM  Pager: [phone] After hours pager: [phone]

## 2019-02-01 NOTE — Progress Notes (Addendum)
Pharmacy Antibiotic Note  Patrick Kidd is a 73 y.o. male admitted on 01/28/2019 with intra-abdominal infection.  Pharmacy has been consulted for piperacillin/tazobactam dosing.  Pt PMH significant for cirrhosis. Admitted with abdominal pain.   Today, 02/01/19  abx D#4  -WBC down to 19.8, elevated but trending down today -SCr 0.94, CrCL > 60 mL/min -Afebrile, PCT 2.1 - s/p paracentesis by IR 12/28, fluid c/w SBP - MR/MRCP neg for gallstones - unable to tolerate PO augmentin 2nd severe withdrawal symptoms  Plan:  piperacillin/tazobactam 3.375 g IV q8h EI Renal function stable, pharmacy to sign off. Will follow dose peripherally. Please re-consult if needed   Height: 6\' 2"  (188 cm) Weight: 243 lb 13.3 oz (110.6 kg) IBW/kg (Calculated) : 82.2  Temp (24hrs), Avg:98.2 F (36.8 C), Min:97.8 F (36.6 C), Max:98.5 F (36.9 C)  Recent Labs  Lab 01/28/19 1833 01/29/19 0500 01/29/19 0626 01/30/19 0332 01/31/19 0600 02/01/19 0244  WBC 15.4* 15.8* 12.5* 21.5* 27.4* 19.8*  CREATININE 1.15  --  1.04 0.72 0.97 0.94    Estimated Creatinine Clearance: 91.3 mL/min (by C-G formula based on SCr of 0.94 mg/dL).    No Known Allergies  Antimicrobials this admission: Piperacillin/tazobactam 12/27 >>   Dose adjustments this admission:  Microbiology results: 12/27 BCx: ngtd 12/28 peritoneal: ngtd, gram stain neg 12/26 Hep A B C NR  Eudelia Bunch, Pharm.D [phone] 02/01/2019 10:29 AM

## 2019-02-01 NOTE — Progress Notes (Signed)
Patient continues to progressively become more agitated unable to follow command and only oriented to self. Patient repeatedly pulls out [place] and electrodes and climbing out of bed. Patient is actively wheezing however patient is sating 98% on 2L. This RN placed mittens on patient to which patient attempts to pull and bite mittens off. RN Mechele Claude assists in calming patient down and reapplying mittens and nasal cannula back  on patient. On call provider notified, see new orders. Patient continues to remove mittens and sliding towards edge of bed to bed rails. This RN is currently in room monitoring patient.

## 2019-02-02 DIAGNOSIS — G9340 Encephalopathy, unspecified: Secondary | ICD-10-CM

## 2019-02-02 LAB — CBC
HCT: 30.5 % — ABNORMAL LOW (ref 39.0–52.0)
Hemoglobin: 10.4 g/dL — ABNORMAL LOW (ref 13.0–17.0)
MCH: 33.4 pg (ref 26.0–34.0)
MCHC: 34.1 g/dL (ref 30.0–36.0)
MCV: 98.1 fL (ref 80.0–100.0)
Platelets: 107 10*3/uL — ABNORMAL LOW (ref 150–400)
RBC: 3.11 MIL/uL — ABNORMAL LOW (ref 4.22–5.81)
RDW: 14.3 % (ref 11.5–15.5)
WBC: 15.4 10*3/uL — ABNORMAL HIGH (ref 4.0–10.5)
nRBC: 0 % (ref 0.0–0.2)

## 2019-02-02 LAB — HEPATIC FUNCTION PANEL
ALT: 24 U/L (ref 0–44)
AST: 37 U/L (ref 15–41)
Albumin: 2.6 g/dL — ABNORMAL LOW (ref 3.5–5.0)
Alkaline Phosphatase: 228 U/L — ABNORMAL HIGH (ref 38–126)
Bilirubin, Direct: 6.1 mg/dL — ABNORMAL HIGH (ref 0.0–0.2)
Indirect Bilirubin: 4.4 mg/dL — ABNORMAL HIGH (ref 0.3–0.9)
Total Bilirubin: 10.5 mg/dL — ABNORMAL HIGH (ref 0.3–1.2)
Total Protein: 5.5 g/dL — ABNORMAL LOW (ref 6.5–8.1)

## 2019-02-02 LAB — PROTIME-INR
INR: 1.2 (ref 0.8–1.2)
Prothrombin Time: 15.5 seconds — ABNORMAL HIGH (ref 11.4–15.2)

## 2019-02-02 LAB — GLUCOSE, CAPILLARY
Glucose-Capillary: 136 mg/dL — ABNORMAL HIGH (ref 70–99)
Glucose-Capillary: 138 mg/dL — ABNORMAL HIGH (ref 70–99)
Glucose-Capillary: 146 mg/dL — ABNORMAL HIGH (ref 70–99)
Glucose-Capillary: 151 mg/dL — ABNORMAL HIGH (ref 70–99)
Glucose-Capillary: 168 mg/dL — ABNORMAL HIGH (ref 70–99)
Glucose-Capillary: 185 mg/dL — ABNORMAL HIGH (ref 70–99)

## 2019-02-02 LAB — BASIC METABOLIC PANEL
Anion gap: 10 (ref 5–15)
BUN: 29 mg/dL — ABNORMAL HIGH (ref 8–23)
CO2: 27 mmol/L (ref 22–32)
Calcium: 8 mg/dL — ABNORMAL LOW (ref 8.9–10.3)
Chloride: 98 mmol/L (ref 98–111)
Creatinine, Ser: 0.88 mg/dL (ref 0.61–1.24)
GFR calc Af Amer: >60 mL/min (ref >60)
GFR calc non Af Amer: >60 mL/min (ref >60)
Glucose, Bld: 142 mg/dL — ABNORMAL HIGH (ref 70–99)
Potassium: 3.6 mmol/L (ref 3.5–5.1)
Sodium: 135 mmol/L (ref 135–145)

## 2019-02-02 LAB — ANTI-SMOOTH MUSCLE ANTIBODY, IGG: F-Actin IgG: 4 Units (ref 0–19)

## 2019-02-02 LAB — MAGNESIUM: Magnesium: 2 mg/dL (ref 1.7–2.4)

## 2019-02-02 LAB — IGG: IgG (Immunoglobin G), Serum: 465 mg/dL — ABNORMAL LOW (ref 603–1613)

## 2019-02-02 LAB — MITOCHONDRIAL ANTIBODIES: Mitochondrial M2 Ab, IgG: <20 Units (ref 0.0–20.0)

## 2019-02-02 MED ORDER — FOLIC ACID 5 MG/ML IJ SOLN
1.0000 mg | INTRAMUSCULAR | Status: AC
  Administered 2019-02-02: 1 mg via INTRAVENOUS
  Filled 2019-02-02: qty 0.2

## 2019-02-02 MED ORDER — PANTOPRAZOLE SODIUM 40 MG PO PACK
40.0000 mg | PACK | Freq: Two times a day (BID) | ORAL | Status: DC
  Administered 2019-02-02 – 2019-02-03 (×5): 40 mg via ORAL
  Filled 2019-02-02 (×6): qty 20

## 2019-02-02 MED ORDER — LACTULOSE 10 GM/15ML PO SOLN
30.0000 g | Freq: Four times a day (QID) | ORAL | Status: DC
  Administered 2019-02-02 – 2019-02-03 (×4): 30 g
  Filled 2019-02-02 (×4): qty 45

## 2019-02-02 MED ORDER — ADULT MULTIVITAMIN LIQUID CH
15.0000 mL | Freq: Every day | ORAL | Status: DC
  Administered 2019-02-02 – 2019-02-03 (×2): 15 mL
  Filled 2019-02-02 (×3): qty 15

## 2019-02-02 MED ORDER — LACTULOSE 10 GM/15ML PO SOLN: 20.0000 g | Freq: Four times a day (QID) | ORAL | Status: DC

## 2019-02-02 MED ORDER — LACTULOSE 10 GM/15ML PO SOLN: 20.0000 g | Freq: Four times a day (QID) | ORAL | Status: DC | PRN

## 2019-02-02 MED ORDER — SODIUM CHLORIDE 0.9 % IV SOLN
INTRAVENOUS | Status: DC | PRN
  Administered 2019-02-02: 250 mL via INTRAVENOUS
  Administered 2019-02-04: 04:00:00 500 mL via INTRAVENOUS

## 2019-02-02 MED ORDER — SODIUM CHLORIDE 0.9 % IV SOLN: 1.0000 mg | Freq: Once | INTRAVENOUS | Status: DC

## 2019-02-02 MED ORDER — LACTULOSE 10 GM/15ML PO SOLN: 10.0000 g | Freq: Three times a day (TID) | ORAL | Status: DC

## 2019-02-02 MED ORDER — RIFAXIMIN 550 MG PO TABS
550.0000 mg | ORAL_TABLET | Freq: Two times a day (BID) | ORAL | Status: DC
  Administered 2019-02-02 – 2019-02-09 (×15): 550 mg via ORAL
  Filled 2019-02-02 (×16): qty 1

## 2019-02-02 NOTE — Progress Notes (Signed)
PROGRESS NOTE    Patrick Kidd  ATF:[id number] DOB: 08-Jul-1945 DOA: 01/28/2019 PCP: [name], MD    Brief Narrative:  Patrick Kidd is a 73 year old Caucasian male with past medical history remarkable for type 2 diabetes mellitus, EtOH cirrhosis, portal hypertension, PUD, BPH, recent Covid-19 infection (01/03/2019)  that was treated with steroids who presented to the ED with right-sided epigastric abdominal pain.  He also reported generalized weakness, fatigue and yellowing to his skin.  He has been drinking vodka, reports roughly sixteen 8 ounce drinks per day.  Denies any nausea, vomiting, diarrhea or hematochezia.  No blood in stool.  In the ED, labs remarkable for transaminitis with obstructive pattern with alk phosphatase of 316, total bilirubin 11.2, indirect bilirubin 4.5, procalcitonin 3.9, CRP 27.2, the BC count 15.8, hemoglobin 13.7.  CT abdomen/pelvis with mildly distended gallbladder with surrounding inflammatory changes, small amount of pericholecystic fluid, findings suggestive of cirrhosis, and possible underlying cholecystitis.  Patient was referred for admission for possible treatment of acute cholecystitis.   Assessment & Plan:   Principal Problem:   Acute cholecystitis Active Problems:   GERD   Type 2 diabetes mellitus with complication, with long-term current use of insulin (HCC)   Essential hypertension   Transaminitis   COVID-19 virus infection   Ascites   Jaundice   Leucocytosis   SBP (spontaneous bacterial peritonitis) (HCC)   Encephalopathy, hepatic (HCC)  Abdominal pain secondary to acute cholecystitis vs developing spontaneous bacterial peritonitis CT abdomen/pelvis with findings suggestive of acute cholecystitis and right upper quadrant ultrasound notable for layering sludge/gallstones with mild gallbladder wall thickening likely secondary to mild acute cholecystitis; common bile duct diameter 3.7 mm. Lipase 13.  Per general surgery, patient  not a candidate for surgical intervention secondary to child's class C cirrhosis, and signed off on 01/31/2019.  Underwent diagnostic paracentesis by IR on 01/31/2019 with 100 mL of turbid amber-colored fluid removed.  Peritoneal fluid analysis remarkable for 50 K nucleated cells with 90 neutrophils consistent with SBP.  MR/MRCP abdomen with no biliary duct dilation, no choledocholithiasis; notable asymmetric gallbladder wall thickening, cirrhosis with increasing small volume ascites. -- GI following; appreciate assistance --WBC 15.4-->15.8-->12.5-->21.5-->27.4--19.8-->15.4 --PCT 3.89-->2.76-->2.10 --Blood cultures x2: No growth x3 days --Peritoneal fluid culture: No growth x2 days --Continue antibiotics with Unasyn --Follow CBC daily  Decompensated EtOH cirrhosis, acute Portal hypertension Etiology likely recent history of Covid-19 with steroid use versus uncontrolled diabetes versus continued EtOH abuse.  CT abdomen with hepatomegaly, cirrhosis and small perihepatic ascites. MR/MRCP abdomen 12/28 with no biliary duct dilation, no choledocholithiasis; notable asymmetric gallbladder wall thickening, cirrhosis with increasing small volume ascites. --GI following as above, no indication for steriods at this time --MELD-Na = 28 on admission --MDF = 17.7 --ammonia level 31 --AST 46-->51-->56-->48-->37 --ALT 34-->37-->31-->28-->24 --AP 316-->301-->253-->267 --Tbili 15.7-->15.6-->13.6-->10.8-->10.5 --INR 1.3-->1.3-->1.3-->1.4-->1.2 --NGT placed 12/30 --Lactulose 30g PO q6h, goal 2-3 soft bowel movements daily --Continue supportive care, trend CBC, LFTs, PT/INR  EtOH abuse disorder with severe withdrawals The evening of 01/31/2019, patient now more confused, agitated with increased heart rate consistent with alcohol withdrawal symptoms. --PCCM following, appreciate assistance --Continue Precedex drip --Ativan prn --Thiamine, folate acid, multivitamin --Supportive care  GERD: Continue  PPI  T2DM, NIDDM Hemoglobin A1c 8.9 01/31/2019. On metformin outpatient --consistent carbohydrate diet --Blood sugars elevated this morning --continue Lantus to 25u [place] daily --Insulin sliding scale for further coverage --CBGs q4h  Hyponatremia Likely multifactorial with dehydration from poor oral intake, pseudohyponatremia secondary to hyperglycemia and persistent alcohol use disorder --Na 124-->131-->132-->134-->135 today --  Follow BMP daily  Recent Covid-19 infection Diagnosed on 01/03/2019, treated with steroids.  Currently oxygenating well on room air. --Discontinue airborne/contact isolation precautions as has been greater than 3 weeks since infection  Morbid obesity Body mass index is 31.28 kg/m. Discussed with patient needs for aggressive lifestyle changes and weight loss as this complicates all facets of care.  Weakness/debility: --PT recommends SNF versus home health with 24-hour assistance, will continue therapy efforts while inpatient   DVT prophylaxis: SCD's, heparin Code Status: Full Code Family Communication: Updated patient spouse, [name] via telephone this afternoon Disposition Plan: Remain inpatient, IV antibiotics, diagnostic paracentesis followed by MRI/MRCP abdomen today, further dependent on clinical course   Consultants:   [facility]  PCCM  General Surgery - Dr. Ninfa Linden, Dr. Brantley Stage - signed off 01/31/2019  Infectious disease - Case discussed with Dr. Tommy Medal on 02/01/2019  Procedures:   none  Antimicrobials:   Zosyn 12/26 - 12/28; 12/29 - 12/29  Augmentin 12/28 - 12/28  Unasyn 12/29>>   Subjective: Patient seen and examined bedside, transfer to stepdown unit yesterday to initiate Precedex drip.  Currently sedated.  Updated patient's spouse via telephone this afternoon.  LFTs continue to slowly improve.  No acute concerns per nursing staff overnight.  Objective: Vitals:   02/02/19 0700 02/02/19 0743 02/02/19 0800 02/02/19 1151  BP:  (!) 106/46  (!) 113/48   Pulse: 69  67   Resp: (!) 27  (!) 28   Temp:  98.5 F (36.9 C)  99 F (37.2 C)  TempSrc:  Axillary  Axillary  SpO2: 96%  96%   Weight:      Height:        Intake/Output Summary (Last 24 hours) at 02/02/2019 1248 Last data filed at 02/02/2019 1245 Gross per 24 hour  Intake 475.17 ml  Output 125 ml  Net 350.17 ml   Filed Weights   01/31/19 0500 02/01/19 0500 02/02/19 0500  Weight: 112.6 kg 110.6 kg 110.5 kg    Examination:  General exam: Calm, sleeping, confused Respiratory system: Clear to auscultation. Respiratory effort normal.  On 2 L nasal cannula oxygenating 98%. Cardiovascular system: S1 & S2 heard, RRR. No JVD, murmurs, rubs, gallops or clicks. No pedal edema. Gastrointestinal system: Abdomen is protuberant with fluid wave, soft with epigastric tenderness with some guarding, no rebound; no tenderness right upper quadrant. Normal bowel sounds heard. Central nervous system: Confused Extremities: Moves all extremities independently Skin: No rashes, lesions or ulcers Psychiatry: Confused    Data Reviewed: I have personally reviewed following labs and imaging studies  CBC: Recent Labs  Lab 01/28/19 1833 01/29/19 0626 01/30/19 0332 01/31/19 0600 02/01/19 0244 02/02/19 0133  WBC 15.4* 12.5* 21.5* 27.4* 19.8* 15.4*  NEUTROABS 12.2*  --   --   --   --   --   HGB 12.8* 13.4 11.9* 12.3* 11.2* 10.4*  HCT 36.4* 38.9* 35.1* 35.7* 33.0* 30.5*  MCV 96.0 97.3 99.2 98.3 99.4 98.1  PLT 97* 133* 125* 106* 108* 809*   Basic Metabolic Panel: Recent Labs  Lab 01/28/19 1833 01/29/19 0626 01/30/19 0332 01/31/19 0600 02/01/19 0244 02/02/19 0133  NA 124* 128* 131* 132* 134* 135  K 4.7 3.8 4.5 3.8 3.6 3.6  CL 86* 88* 89* 94* 93* 98  CO2 24 26 23 23 27 27   GLUCOSE 514* 300* 272* 203* 222* 142*  BUN 29* 28* 31* 32* 31* 29*  CREATININE 1.15 1.04 0.72 0.97 0.94 0.88  CALCIUM 8.3* 8.2* 7.5* 7.7* 8.1* 8.0*  MG 1.5*  --   --   --   --  2.0    GFR: Estimated Creatinine Clearance: 97.4 mL/min (by C-G formula based on SCr of 0.88 mg/dL). Liver Function Tests: Recent Labs  Lab 01/29/19 0626 01/30/19 0332 01/31/19 0600 02/01/19 0244 02/02/19 0133  AST 51* 56* 63* 48* 37  ALT 37 31 37 28 24  ALKPHOS 301* 253* 304* 267* 228*  BILITOT 15.6* 13.6* 12.3* 10.8* 10.5*  PROT 6.1* 5.9* 5.5* 6.2* 5.5*  ALBUMIN 2.1* 2.0* 2.0* 3.3* 2.6*   Recent Labs  Lab 01/30/19 0332  LIPASE 13   Recent Labs  Lab 01/29/19 1143 02/01/19 1211  AMMONIA 31 35   Coagulation Profile: Recent Labs  Lab 01/29/19 0500 01/30/19 0332 01/31/19 0600 02/01/19 0244 02/02/19 0133  INR 1.3* 1.3* 1.3* 1.4* 1.2   Cardiac Enzymes: Recent Labs  Lab 01/29/19 0500  CKTOTAL 12*  CKMB 1.6   BNP (last 3 results) No results for input(s): PROBNP in the last 8760 hours. HbA1C: Recent Labs    01/31/19 0600  HGBA1C 8.9*   CBG: Recent Labs  Lab 02/01/19 1926 02/01/19 2330 02/02/19 0344 02/02/19 0739 02/02/19 1148  GLUCAP 132* 138* 138* 136* 151*   Lipid Profile: No results for input(s): CHOL, HDL, LDLCALC, TRIG, CHOLHDL, LDLDIRECT in the last 72 hours. Thyroid Function Tests: No results for input(s): TSH, T4TOTAL, FREET4, T3FREE, THYROIDAB in the last 72 hours. Anemia Panel: Recent Labs    01/31/19 0600  IRON 17*   Sepsis Labs: Recent Labs  Lab 01/29/19 0626 01/31/19 0600 02/01/19 0244  PROCALCITON 3.89 2.76 2.10    Recent Results (from the past 240 hour(s))  Culture, blood (routine x 2)     Status: None (Preliminary result)   Collection Time: 01/29/19 12:23 AM   Specimen: BLOOD  Result Value Ref Range Status   Specimen Description   Final    BLOOD RIGHT ANTECUBITAL Performed at [facility], [place] [address] [place], [place] [address]    Special Requests   Final    BOTTLES DRAWN AEROBIC AND ANAEROBIC Blood Culture adequate volume Performed at [facility] [address]  [place], [place] [address]    Culture   Final    NO GROWTH 4 DAYS Performed at [facility], [place] [address], [place], [place] [address]    Report Status PENDING  Incomplete  Culture, blood (routine x 2)     Status: None (Preliminary result)   Collection Time: 01/30/19  3:32 AM   Specimen: BLOOD  Result Value Ref Range Status   Specimen Description   Final    BLOOD LEFT HAND Performed at [facility] [address], [place], [place] [address]    Special Requests   Final    BOTTLES DRAWN AEROBIC ONLY Blood Culture adequate volume Performed at [facility] [address], [place], [place] [address]    Culture   Final    NO GROWTH 3 DAYS Performed at [facility], [place] [address], [place], [place] [address]    Report Status PENDING  Incomplete  Aerobic/Anaerobic Culture (surgical/deep wound)     Status: None (Preliminary result)   Collection Time: 01/31/19  3:25 PM   Specimen: Abdomen; Peritoneal Fluid  Result Value Ref Range Status   Specimen Description   Final    PERITONEAL Performed at [facility] [address], [place],  [address]    Special Requests  Final    NONE Performed at [facility], [place] [address] [place], [place] [address]    Gram Stain   Final    ABUNDANT WBC PRESENT, PREDOMINANTLY PMN NO ORGANISMS SEEN    Culture   Final    NO GROWTH 2 DAYS NO ANAEROBES ISOLATED; CULTURE IN PROGRESS FOR 5 DAYS Performed at [facility] [address], [place], [place] [address]    Report Status PENDING  Incomplete  MRSA PCR Screening     Status: None   Collection Time: 02/01/19  5:18 PM   Specimen: Nasal Mucosa; Nasopharyngeal  Result Value Ref Range Status   MRSA by PCR NEGATIVE NEGATIVE Final    Comment:        The GeneXpert MRSA Assay (FDA approved for NASAL specimens only), is one component of a comprehensive MRSA colonization surveillance program. It is  not intended to diagnose MRSA infection nor to guide or monitor treatment for MRSA infections. Performed at [facility], [place] [address] [place],  [address]          Radiology Studies: DG Abd 1 View  Result Date: 02/02/2019 CLINICAL DATA:  Initial evaluation for NG tube placement. EXAM: ABDOMEN - 1 VIEW COMPARISON:  Prior radiograph from 09/06/2015. FINDINGS: Enteric tube in place with tip seen overlying the region of the pylorus/proximal duodenum. Side hole well beyond the GE junction. Tip projects distally. Visualized bowel gas pattern within normal limits. IMPRESSION: Enteric tube in place with tip overlying the region of the pylorus/proximal duodenum. Tip well beyond the GE junction. Electronically Signed   By: Jeannine Boga M.D.   On: 02/02/2019 00:45   MR 3D Recon At Scanner  Result Date: 01/31/2019 CLINICAL DATA:  Alcoholic cirrhosis. Jaundice. Rule out intra and extrahepatic biliary duct obstruction. Diabetes. Portal hypertension. Recent COVID infection 1 month ago. EXAM: MRI ABDOMEN WITHOUT AND WITH CONTRAST (INCLUDING MRCP) TECHNIQUE: Multiplanar multisequence MR imaging of the abdomen was performed both before and after the administration of intravenous contrast. Heavily T2-weighted images of the biliary and pancreatic ducts were obtained, and three-dimensional MRCP images were rendered by post processing. CONTRAST:  68m GADAVIST GADOBUTROL 1 MMOL/ML IV SOLN COMPARISON:  01/29/2019 abdominal ultrasound. 01/28/2019 abdominopelvic CT. 07/27/2018 abdominal MRI. FINDINGS: Mild motion degradation throughout. Lower chest: Bibasilar atelectasis. Normal heart size. Trace left pleural fluid. Hepatobiliary: Moderate cirrhosis. Dominant right hepatic lobe cyst. No suspicious liver lesion. The gallstones and sludge are more apparent on ultrasound. There is primarily medial gallbladder wall thickening including at 8 mm on 36/8. New since the prior MRI. Mild  mucosal enhancement throughout the gallbladder, without surrounding hyperemia. No intra or extrahepatic biliary duct dilatation. The common duct measures on the order of 4 mm on 82/6. No choledocholithiasis. Pancreas: Normal pancreas for age, without duct dilatation or acute inflammation. Spleen:  Borderline splenomegaly at 14.7 cm craniocaudal. Adrenals/Urinary Tract: Normal adrenal glands. Normal kidneys, without hydronephrosis. Stomach/Bowel: Gastric antral underdistention. Normal caliber of abdominal bowel loops. Vascular/Lymphatic: Aortic atherosclerosis. Patent portal, splenic, hepatic veins. Prominent porta hepatis nodes are likely reactive and similar. Other: Small volume abdominal ascites, increased compared to the prior CT. For example, new fluid is seen surrounding the left hepatic lobe and extending in the lesser sac, including on 34/3. Musculoskeletal: No acute osseous abnormality. IMPRESSION: 1. Mildly motion degraded exam. 2. No biliary duct dilatation or choledocholithiasis. 3. Asymmetric gallbladder wall thickening in the setting of stones/sludge on ultrasound. Nonspecific in the setting of cirrhosis. Cannot exclude a component  of chronic cholecystitis. 4. Cirrhosis. 5. Trace left pleural fluid. 6. Increase in small volume abdominal ascites. Electronically Signed   By: Abigail Miyamoto M.D.   On: 01/31/2019 18:57   US Paracentesis  Result Date: 01/31/2019 INDICATION: Patient with history of alcoholic cirrhosis, portal hypertension, recent COVID-19 infection, abdominal pain, ascites. Request made for diagnostic paracentesis up to 100 cc. EXAM: ULTRASOUND GUIDED DIAGNOSTIC PARACENTESIS MEDICATIONS: None COMPLICATIONS: None immediate. PROCEDURE: Informed written consent was obtained from the patient after a discussion of the risks, benefits and alternatives to treatment. A timeout was performed prior to the initiation of the procedure. Initial ultrasound scanning demonstrates a small amount of ascites  within the right upper to mid abdominal quadrant. The right upper to mid abdomen was prepped and draped in the usual sterile fashion. 1% lidocaine was used for local anesthesia. Following this, a 19 gauge, 7-cm, Yueh catheter was introduced. An ultrasound image was saved for documentation purposes. The paracentesis was performed. The catheter was removed and a dressing was applied. The patient tolerated the procedure well without immediate post procedural complication. FINDINGS: A total of approximately 100 cc of turbid, amber fluid was removed. Samples were sent to the laboratory as requested by the clinical team. IMPRESSION: Successful ultrasound-guided diagnostic paracentesis yielding 100 cc of peritoneal fluid. Read by: Rowe Robert, PA-C Electronically Signed   By: Sandi Mariscal M.D.   On: 01/31/2019 15:10   MR ABDOMEN MRCP W WO CONTAST  Result Date: 01/31/2019 CLINICAL DATA:  Alcoholic cirrhosis. Jaundice. Rule out intra and extrahepatic biliary duct obstruction. Diabetes. Portal hypertension. Recent COVID infection 1 month ago. EXAM: MRI ABDOMEN WITHOUT AND WITH CONTRAST (INCLUDING MRCP) TECHNIQUE: Multiplanar multisequence MR imaging of the abdomen was performed both before and after the administration of intravenous contrast. Heavily T2-weighted images of the biliary and pancreatic ducts were obtained, and three-dimensional MRCP images were rendered by post processing. CONTRAST:  58m GADAVIST GADOBUTROL 1 MMOL/ML IV SOLN COMPARISON:  01/29/2019 abdominal ultrasound. 01/28/2019 abdominopelvic CT. 07/27/2018 abdominal MRI. FINDINGS: Mild motion degradation throughout. Lower chest: Bibasilar atelectasis. Normal heart size. Trace left pleural fluid. Hepatobiliary: Moderate cirrhosis. Dominant right hepatic lobe cyst. No suspicious liver lesion. The gallstones and sludge are more apparent on ultrasound. There is primarily medial gallbladder wall thickening including at 8 mm on 36/8. New since the prior MRI.  Mild mucosal enhancement throughout the gallbladder, without surrounding hyperemia. No intra or extrahepatic biliary duct dilatation. The common duct measures on the order of 4 mm on 82/6. No choledocholithiasis. Pancreas: Normal pancreas for age, without duct dilatation or acute inflammation. Spleen:  Borderline splenomegaly at 14.7 cm craniocaudal. Adrenals/Urinary Tract: Normal adrenal glands. Normal kidneys, without hydronephrosis. Stomach/Bowel: Gastric antral underdistention. Normal caliber of abdominal bowel loops. Vascular/Lymphatic: Aortic atherosclerosis. Patent portal, splenic, hepatic veins. Prominent porta hepatis nodes are likely reactive and similar. Other: Small volume abdominal ascites, increased compared to the prior CT. For example, new fluid is seen surrounding the left hepatic lobe and extending in the lesser sac, including on 34/3. Musculoskeletal: No acute osseous abnormality. IMPRESSION: 1. Mildly motion degraded exam. 2. No biliary duct dilatation or choledocholithiasis. 3. Asymmetric gallbladder wall thickening in the setting of stones/sludge on ultrasound. Nonspecific in the setting of cirrhosis. Cannot exclude a component of chronic cholecystitis. 4. Cirrhosis. 5. Trace left pleural fluid. 6. Increase in small volume abdominal ascites. Electronically Signed   By: KAbigail MiyamotoM.D.   On: 01/31/2019 18:57        Scheduled Meds:  Chlorhexidine Gluconate Cloth  6 each Topical Daily   heparin injection (subcutaneous)  5,000 Units Subcutaneous Q8H   insulin aspart  0-9 Units Subcutaneous Q4H   insulin glargine  25 Units Subcutaneous Daily   lactulose  30 g Per Tube Q6H   mouth rinse  15 mL Mouth Rinse BID   multivitamin  15 mL Per Tube Daily   pantoprazole sodium  40 mg Oral BID   rifaximin  550 mg Oral BID   sodium chloride flush  3 mL Intravenous Q12H   thiamine  100 mg Oral Daily   Or   thiamine  100 mg Intravenous Daily   zinc sulfate  220 mg Oral Daily    Continuous Infusions:  sodium chloride 250 mL (01/30/19 1420)   sodium chloride 10 mL/hr at 02/02/19 3149   ampicillin-sulbactam (UNASYN) IV Stopped (02/02/19 1240)   dexmedetomidine (PRECEDEX) IV infusion 0.5 mcg/kg/hr (02/02/19 0842)     LOS: 4 days    Time spent: 40 minutes spent on chart review, discussion with nursing staff, consultants, updating family and interview/physical exam; more than 50% of that time was spent in counseling and/or coordination of care.    Emanuel Dowson J [place] DO [facility] 02/02/2019, 12:48 PM

## 2019-02-02 NOTE — Progress Notes (Signed)
OT Cancellation Note  Patient Details Name: JALIL KAMHOLZ MRN: [id number] DOB: 28-May-1945   Cancelled Treatment:    Reason Eval/Treat Not Completed: Other (comment).  Pt not ready for OT today per nurse.  Will check back tomorrow  Donnetta Gillin 02/02/2019, 11:23 AM  Karsten Ro, OTR/L Acute Rehabilitation Services 02/02/2019

## 2019-02-02 NOTE — Progress Notes (Signed)
NAME:  Patrick Kidd, MRN:  [id number], DOB:  Jun 29, 1945, LOS: 4 ADMISSION DATE:  01/28/2019, CONSULTATION DATE:  01/31/29 REFERRING MD:  [place] Eric J, DO, CHIEF COMPLAINT:  Alcohol withdrawal  Brief History   Patrick Kidd is a 73 year old gentleman with a history of decompensated cirrhosis with ascites secondary to alcohol presented on 12/25 with jaundice and abdominal pain.  He drinks 16 ounces of vodka per day at least.  He was also recently Covid positive on 11/30 resented to the ED with shortness of breath, he was treated with prednisone as an outpatient.  He had ascites on admission, and there was concern for SBP which was confirmed on paracentesis with a PMN count greater than 45,000.  Over the last 24 hours he has had worsening agitated delirium secondary to alcohol withdrawal and since this morning has received over 9 mg of Ativan.  PCCM is being consulted to assist with alcohol withdrawal and agitated delirium.  Consults:  PCCM   Procedures:  Paracentesis 12/28 >> SBP Echocardiogram 12/25 shows grade 1 diastolic dysfunction  Micro Data:  12/28 peritoneal fluid culture negative 12/26 blood cultures negative 12/27 blood cultures negative  Antimicrobials:  Zosyn >>12/25>>12/28 Augmentin >> 12/28 >> Flagyl>>12/29>>12/29 Unasyn 12/29   Interim history/subjective:  Overnight NG tube placed. Had one BM yesterday but otherwise none. Resting comfortably on precedex gtt  Objective   Blood pressure (!) 106/46, pulse 69, temperature 98.5 F (36.9 C), temperature source Axillary, resp. rate (!) 27, height 6\' 2"  (1.88 m), weight 110.5 kg, SpO2 96 %.        Intake/Output Summary (Last 24 hours) at 02/02/2019 0930 Last data filed at 02/02/2019 [id number] Gross per 24 hour  Intake 543.17 ml  Output 375 ml  Net 168.17 ml   Filed Weights   01/31/19 0500 02/01/19 0500 02/02/19 0500  Weight: 112.6 kg 110.6 kg 110.5 kg    Examination: General: jaundiced, somnolent Lungs:  CTAB, no wheezes or crackles Cardiovascular: RRR, no mrg Abdomen: distended with ascites, soft, diffusely ttp Extremities: no edema Neuro: not following commands MSK: jaundiced  Assessment & Plan:  [name] is a 73 year old gentleman with a history of decompensated alcoholic cirrhosis who presents with:  Acute encephalopathy secondary to alcohol withdrawal syndrome with delirium, hepatic encephalopathy Spontaneous bacterial peritonitis Decompensated cirrhosis secondary to alcohol use Hyperbilirubinemia, obstructive pattern, cholestasis vs acute cholecystitis.  Coagulopathy of liver disease  Maintain precedex infusion to be titrated to RAAS 0 to -1. Minimize prn ativan. Continue lactulose - will increase to QID through NG tube for goal 3 bowel movements a day Continue rifaximin and zinc.  Now on unasyn for SBP. Was on zosyn since 12/25 - usual course is 5 days of 3rd generation cephalosporin for 5 days.   Best practice:  Diet: N.p.o. for mental status, okay to advance diet if alert. Pain/Anxiety/Delirium protocol (if indicated): Limit benzos DVT prophylaxis: heparin sub cu. Hold if bleeding or platelets less than 50k GI prophylaxis: Pantoprazole Glucose control: Controlled Mobility: Out of bed as tolerated, currently agitated delirium Code Status: Full Family Communication: per primary Disposition: Step Down  Labs   CBC: Recent Labs  Lab 01/28/19 1833 01/29/19 0626 01/30/19 0332 01/31/19 0600 02/01/19 0244 02/02/19 0133  WBC 15.4* 12.5* 21.5* 27.4* 19.8* 15.4*  NEUTROABS 12.2*  --   --   --   --   --   HGB 12.8* 13.4 11.9* 12.3* 11.2* 10.4*  HCT 36.4* 38.9* 35.1* 35.7* 33.0* 30.5*  MCV 96.0 97.3 99.2 98.3  99.4 98.1  PLT 97* 133* 125* 106* 108* 107*    Basic Metabolic Panel: Recent Labs  Lab 01/28/19 1833 01/29/19 0626 01/30/19 0332 01/31/19 0600 02/01/19 0244 02/02/19 0133  NA 124* 128* 131* 132* 134* 135  K 4.7 3.8 4.5 3.8 3.6 3.6  CL 86* 88* 89* 94*  93* 98  CO2 24 26 23 23 27 27   GLUCOSE 514* 300* 272* 203* 222* 142*  BUN 29* 28* 31* 32* 31* 29*  CREATININE 1.15 1.04 0.72 0.97 0.94 0.88  CALCIUM 8.3* 8.2* 7.5* 7.7* 8.1* 8.0*  MG 1.5*  --   --   --   --  2.0   GFR: Estimated Creatinine Clearance: 97.4 mL/min (by C-G formula based on SCr of 0.88 mg/dL). Recent Labs  Lab 01/29/19 0626 01/30/19 0332 01/31/19 0600 02/01/19 0244 02/02/19 0133  PROCALCITON 3.89  --  2.76 2.10  --   WBC 12.5* 21.5* 27.4* 19.8* 15.4*    Liver Function Tests: Recent Labs  Lab 01/29/19 0626 01/30/19 0332 01/31/19 0600 02/01/19 0244 02/02/19 0133  AST 51* 56* 63* 48* 37  ALT 37 31 37 28 24  ALKPHOS 301* 253* 304* 267* 228*  BILITOT 15.6* 13.6* 12.3* 10.8* 10.5*  PROT 6.1* 5.9* 5.5* 6.2* 5.5*  ALBUMIN 2.1* 2.0* 2.0* 3.3* 2.6*   Recent Labs  Lab 01/30/19 0332  LIPASE 13   Recent Labs  Lab 01/29/19 1143 02/01/19 1211  AMMONIA 31 35    ABG No results found for: PHART, PCO2ART, PO2ART, HCO3, TCO2, ACIDBASEDEF, O2SAT   Coagulation Profile: Recent Labs  Lab 01/29/19 0500 01/30/19 0332 01/31/19 0600 02/01/19 0244 02/02/19 0133  INR 1.3* 1.3* 1.3* 1.4* 1.2    Cardiac Enzymes: Recent Labs  Lab 01/29/19 0500  CKTOTAL 12*  CKMB 1.6    HbA1C: Hgb A1c MFr Bld  Date/Time Value Ref Range Status  01/31/2019 06:00 AM 8.9 (H) 4.8 - 5.6 % Final    Comment:    (NOTE) Pre diabetes:          5.7%-6.4% Diabetes:              >6.4% Glycemic control for   <7.0% adults with diabetes   11/22/2018 10:30 AM 6.7 (H) 4.6 - 6.5 % Final    Comment:    Glycemic Control Guidelines for People with Diabetes:Non Diabetic:  <6%Goal of Therapy: <7%Additional Action Suggested:  >8%     CBG: Recent Labs  Lab 02/01/19 1715 02/01/19 1926 02/01/19 2330 02/02/19 0344 02/02/19 0739  GLUCAP 125* 132* 138* 138* 136*    Critical care time:   The patient is critically ill with multiple organ systems failure and requires high complexity decision  making for assessment and support, frequent evaluation and titration of therapies, application of advanced monitoring technologies and extensive interpretation of multiple databases.   Critical Care Time devoted to patient care services described in this note is 35 minutes. This time reflects the time of my personal involvement. This critical care time does not reflect separately billable procedures or procedure time, teaching time or supervisory time of PA/NP/Med student/Med Resident etc but could involve care discussion time.  [facility] 02/02/2019 9:30 AM  Pager: [phone] After hours pager: [phone]

## 2019-02-02 NOTE — Progress Notes (Addendum)
[facility] Progress Note  CC:  Cirrhosis, jaundice, HE  Subjective:  Still very lethargic.  Questionable one further BM after having received lactulose enema yesterday.  Objective:  Vital signs in last 24 hours: Temp:  [98.5 F (36.9 C)-100.5 F (38.1 C)] 99 F (37.2 C) (12/30 1151) Pulse Rate:  [65-112] 65 (12/30 1300) Resp:  [22-32] 26 (12/30 1300) BP: (106-165)/(42-83) 118/43 (12/30 1300) SpO2:  [92 %-96 %] 96 % (12/30 1300) Weight:  [110.5 kg] 110.5 kg (12/30 0500) Last BM Date: 02/01/19 General:  Lethargic, but arousable; jaundice noted. Heart:  Regular rate and rhythm; no murmurs Pulm:  CTAB.  No increased WOB. Abdomen:  Softly distended with ascites fluid.  BS present.  No appreciable TTP. Extremities:  Without edema. Neurologic:  Alert and oriented x 4;  grossly normal neurologically. Psych:  Alert and cooperative. Normal mood and affect.  Intake/Output from previous day: 12/29 0701 - 12/30 0700 In: 543.2 [P.O.:15; I.V.:181.9; IV Piggyback:346.2] Out: 375 [Urine:375]  Lab Results: Recent Labs    01/31/19 0600 02/01/19 0244 02/02/19 0133  WBC 27.4* 19.8* 15.4*  HGB 12.3* 11.2* 10.4*  HCT 35.7* 33.0* 30.5*  PLT 106* 108* 107*   BMET Recent Labs    01/31/19 0600 02/01/19 0244 02/02/19 0133  NA 132* 134* 135  K 3.8 3.6 3.6  CL 94* 93* 98  CO2 23 27 27   GLUCOSE 203* 222* 142*  BUN 32* 31* 29*  CREATININE 0.97 0.94 0.88  CALCIUM 7.7* 8.1* 8.0*   LFT Recent Labs    02/02/19 0133  PROT 5.5*  ALBUMIN 2.6*  AST 37  ALT 24  ALKPHOS 228*  BILITOT 10.5*  BILIDIR 6.1*  IBILI 4.4*   PT/INR Recent Labs    02/01/19 0244 02/02/19 0133  LABPROT 16.7* 15.5*  INR 1.4* 1.2   DG Abd 1 View  Result Date: 02/02/2019 CLINICAL DATA:  Initial evaluation for NG tube placement. EXAM: ABDOMEN - 1 VIEW COMPARISON:  Prior radiograph from 09/06/2015. FINDINGS: Enteric tube in place with tip seen overlying the region of the pylorus/proximal  duodenum. Side hole well beyond the GE junction. Tip projects distally. Visualized bowel gas pattern within normal limits. IMPRESSION: Enteric tube in place with tip overlying the region of the pylorus/proximal duodenum. Tip well beyond the GE junction. Electronically Signed   By: Jeannine Boga M.D.   On: 02/02/2019 00:45   MR 3D Recon At Scanner  Result Date: 01/31/2019 CLINICAL DATA:  Alcoholic cirrhosis. Jaundice. Rule out intra and extrahepatic biliary duct obstruction. Diabetes. Portal hypertension. Recent COVID infection 1 month ago. EXAM: MRI ABDOMEN WITHOUT AND WITH CONTRAST (INCLUDING MRCP) TECHNIQUE: Multiplanar multisequence MR imaging of the abdomen was performed both before and after the administration of intravenous contrast. Heavily T2-weighted images of the biliary and pancreatic ducts were obtained, and three-dimensional MRCP images were rendered by post processing. CONTRAST:  36mL GADAVIST GADOBUTROL 1 MMOL/ML IV SOLN COMPARISON:  01/29/2019 abdominal ultrasound. 01/28/2019 abdominopelvic CT. 07/27/2018 abdominal MRI. FINDINGS: Mild motion degradation throughout. Lower chest: Bibasilar atelectasis. Normal heart size. Trace left pleural fluid. Hepatobiliary: Moderate cirrhosis. Dominant right hepatic lobe cyst. No suspicious liver lesion. The gallstones and sludge are more apparent on ultrasound. There is primarily medial gallbladder wall thickening including at 8 mm on 36/8. New since the prior MRI. Mild mucosal enhancement throughout the gallbladder, without surrounding hyperemia. No intra or extrahepatic biliary duct dilatation. The common duct measures on the order of 4 mm on 82/6. No choledocholithiasis. Pancreas: Normal  pancreas for age, without duct dilatation or acute inflammation. Spleen:  Borderline splenomegaly at 14.7 cm craniocaudal. Adrenals/Urinary Tract: Normal adrenal glands. Normal kidneys, without hydronephrosis. Stomach/Bowel: Gastric antral underdistention. Normal  caliber of abdominal bowel loops. Vascular/Lymphatic: Aortic atherosclerosis. Patent portal, splenic, hepatic veins. Prominent porta hepatis nodes are likely reactive and similar. Other: Small volume abdominal ascites, increased compared to the prior CT. For example, new fluid is seen surrounding the left hepatic lobe and extending in the lesser sac, including on 34/3. Musculoskeletal: No acute osseous abnormality. IMPRESSION: 1. Mildly motion degraded exam. 2. No biliary duct dilatation or choledocholithiasis. 3. Asymmetric gallbladder wall thickening in the setting of stones/sludge on ultrasound. Nonspecific in the setting of cirrhosis. Cannot exclude a component of chronic cholecystitis. 4. Cirrhosis. 5. Trace left pleural fluid. 6. Increase in small volume abdominal ascites. Electronically Signed   By: Abigail Miyamoto M.D.   On: 01/31/2019 18:57   US Paracentesis  Result Date: 01/31/2019 INDICATION: Patient with history of alcoholic cirrhosis, portal hypertension, recent COVID-19 infection, abdominal pain, ascites. Request made for diagnostic paracentesis up to 100 cc. EXAM: ULTRASOUND GUIDED DIAGNOSTIC PARACENTESIS MEDICATIONS: None COMPLICATIONS: None immediate. PROCEDURE: Informed written consent was obtained from the patient after a discussion of the risks, benefits and alternatives to treatment. A timeout was performed prior to the initiation of the procedure. Initial ultrasound scanning demonstrates a small amount of ascites within the right upper to mid abdominal quadrant. The right upper to mid abdomen was prepped and draped in the usual sterile fashion. 1% lidocaine was used for local anesthesia. Following this, a 19 gauge, 7-cm, Yueh catheter was introduced. An ultrasound image was saved for documentation purposes. The paracentesis was performed. The catheter was removed and a dressing was applied. The patient tolerated the procedure well without immediate post procedural complication. FINDINGS: A  total of approximately 100 cc of turbid, amber fluid was removed. Samples were sent to the laboratory as requested by the clinical team. IMPRESSION: Successful ultrasound-guided diagnostic paracentesis yielding 100 cc of peritoneal fluid. Read by: Rowe Robert, PA-C Electronically Signed   By: Sandi Mariscal M.D.   On: 01/31/2019 15:10   MR ABDOMEN MRCP W WO CONTAST  Result Date: 01/31/2019 CLINICAL DATA:  Alcoholic cirrhosis. Jaundice. Rule out intra and extrahepatic biliary duct obstruction. Diabetes. Portal hypertension. Recent COVID infection 1 month ago. EXAM: MRI ABDOMEN WITHOUT AND WITH CONTRAST (INCLUDING MRCP) TECHNIQUE: Multiplanar multisequence MR imaging of the abdomen was performed both before and after the administration of intravenous contrast. Heavily T2-weighted images of the biliary and pancreatic ducts were obtained, and three-dimensional MRCP images were rendered by post processing. CONTRAST:  70mL GADAVIST GADOBUTROL 1 MMOL/ML IV SOLN COMPARISON:  01/29/2019 abdominal ultrasound. 01/28/2019 abdominopelvic CT. 07/27/2018 abdominal MRI. FINDINGS: Mild motion degradation throughout. Lower chest: Bibasilar atelectasis. Normal heart size. Trace left pleural fluid. Hepatobiliary: Moderate cirrhosis. Dominant right hepatic lobe cyst. No suspicious liver lesion. The gallstones and sludge are more apparent on ultrasound. There is primarily medial gallbladder wall thickening including at 8 mm on 36/8. New since the prior MRI. Mild mucosal enhancement throughout the gallbladder, without surrounding hyperemia. No intra or extrahepatic biliary duct dilatation. The common duct measures on the order of 4 mm on 82/6. No choledocholithiasis. Pancreas: Normal pancreas for age, without duct dilatation or acute inflammation. Spleen:  Borderline splenomegaly at 14.7 cm craniocaudal. Adrenals/Urinary Tract: Normal adrenal glands. Normal kidneys, without hydronephrosis. Stomach/Bowel: Gastric antral underdistention.  Normal caliber of abdominal bowel loops. Vascular/Lymphatic: Aortic atherosclerosis. Patent portal, splenic, hepatic  veins. Prominent porta hepatis nodes are likely reactive and similar. Other: Small volume abdominal ascites, increased compared to the prior CT. For example, new fluid is seen surrounding the left hepatic lobe and extending in the lesser sac, including on 34/3. Musculoskeletal: No acute osseous abnormality. IMPRESSION: 1. Mildly motion degraded exam. 2. No biliary duct dilatation or choledocholithiasis. 3. Asymmetric gallbladder wall thickening in the setting of stones/sludge on ultrasound. Nonspecific in the setting of cirrhosis. Cannot exclude a component of chronic cholecystitis. 4. Cirrhosis. 5. Trace left pleural fluid. 6. Increase in small volume abdominal ascites. Electronically Signed   By: Abigail Miyamoto M.D.   On: 01/31/2019 18:57   Assessment / Plan: 55.  73 year old male with alcoholic cirrhosis, jaundice, RUQ pain and leukocytosis. A diagnostic paracentesis (1109ml peritoneal fluid was removed) 12/28 was positive for SBP with a PMN count greater than 45,000.  He was previously on Zosyn which was changed to Augmentin po 12/28.  The hospitalist consulted ID and switched him back to Zosyn 3.375 mg IV every 8 hours as he was too sedated to take po Augmentin. Then changed to IV Unasyn on 12/29.  He received Albumin 100 g IV x1 dose post paracentesis. MRCP/MRI 12/28 showed asymmetric gallbladder wall thickening, no evidence of choledocholithiasis.  LFTs and T. Bili levels drifting down slowly. Leukocytosis improving with WBC count of 15.4. He is afebrile.   2. Hepatic encephalopathy:  Now has NGT so lactulose changed to 30 gms every 6 hours via tube until he wakes up.  3.  Thrombocytopenia secondary to cirrhosis. PLT 107.  4.  Coagulopathy.  INR 1.4 >> 1.3>>1.2.   5.  Anemia:  Normocytic.  Serum iron low, but ferritin elevated.  Hgb drifting down but no sign of bleeding.  Likely  dilutional at this point.  Will check remaining iron studies as well as B12 and folate levels.    LOS: 4 days   Laban Emperor. Zehr  02/02/2019, 2:03 PM    Attending physician's note   I have taken an interval history, reviewed the chart and examined the patient. I agree with the Advanced Practitioner's note, impression and recommendations.   SBP on IV Unasyn.  Leukocytosis improving  Hepatic encephalopathy: Arousable but does not follow commands. Continue lactulose 30 g every 6 hours through NG tube until he wakes up and is more alert.    Worsening anemia with iron deficiency: No overt GI bleed but he is due for screening EGD for esophageal varices, and also past due for surveillance colonoscopy with history of multiple adenomatous colon polyps.  We will plan to schedule as outpatient once acute issues resolve    K. Denzil Magnuson , MD [phone]

## 2019-02-02 NOTE — Progress Notes (Signed)
PT Cancellation Note  Patient Details Name: Patrick Kidd MRN: [id number] DOB: 07/22/1945   Cancelled Treatment:     per RN pt is unable to participate today.  Will check back later in week as schedule permits,     Nathanial Rancher 02/02/2019, 12:20 PM

## 2019-02-02 NOTE — Progress Notes (Addendum)
NG tube inserted per order and placement verified by KUB. Medications administered via NG tube.   At 345am, increased precedex from 0.5 mcg to 0.6 mcg due to patient interfering with mittens, NG tube & IVs. Will continue to monitor.

## 2019-02-03 DIAGNOSIS — D72829 Elevated white blood cell count, unspecified: Secondary | ICD-10-CM

## 2019-02-03 LAB — CBC
HCT: 33.8 % — ABNORMAL LOW (ref 39.0–52.0)
Hemoglobin: 11.3 g/dL — ABNORMAL LOW (ref 13.0–17.0)
MCH: 33.6 pg (ref 26.0–34.0)
MCHC: 33.4 g/dL (ref 30.0–36.0)
MCV: 100.6 fL — ABNORMAL HIGH (ref 80.0–100.0)
Platelets: 117 10*3/uL — ABNORMAL LOW (ref 150–400)
RBC: 3.36 MIL/uL — ABNORMAL LOW (ref 4.22–5.81)
RDW: 14.6 % (ref 11.5–15.5)
WBC: 15.7 10*3/uL — ABNORMAL HIGH (ref 4.0–10.5)
nRBC: 0 % (ref 0.0–0.2)

## 2019-02-03 LAB — VITAMIN B12: Vitamin B-12: 2026 pg/mL — ABNORMAL HIGH (ref 180–914)

## 2019-02-03 LAB — BASIC METABOLIC PANEL
Anion gap: 12 (ref 5–15)
BUN: 35 mg/dL — ABNORMAL HIGH (ref 8–23)
CO2: 26 mmol/L (ref 22–32)
Calcium: 7.9 mg/dL — ABNORMAL LOW (ref 8.9–10.3)
Chloride: 99 mmol/L (ref 98–111)
Creatinine, Ser: 0.71 mg/dL (ref 0.61–1.24)
GFR calc Af Amer: >60 mL/min (ref >60)
GFR calc non Af Amer: >60 mL/min (ref >60)
Glucose, Bld: 164 mg/dL — ABNORMAL HIGH (ref 70–99)
Potassium: 3.5 mmol/L (ref 3.5–5.1)
Sodium: 137 mmol/L (ref 135–145)

## 2019-02-03 LAB — PROTIME-INR
INR: 1.2 (ref 0.8–1.2)
Prothrombin Time: 14.9 seconds (ref 11.4–15.2)

## 2019-02-03 LAB — CULTURE, BLOOD (ROUTINE X 2)
Culture: NO GROWTH
Special Requests: ADEQUATE

## 2019-02-03 LAB — IRON AND TIBC
Iron: 45 ug/dL (ref 45–182)
Saturation Ratios: 39 % (ref 17.9–39.5)
TIBC: 116 ug/dL — ABNORMAL LOW (ref 250–450)
UIBC: 71 ug/dL

## 2019-02-03 LAB — HEPATIC FUNCTION PANEL
ALT: 25 U/L (ref 0–44)
AST: 47 U/L — ABNORMAL HIGH (ref 15–41)
Albumin: 2.7 g/dL — ABNORMAL LOW (ref 3.5–5.0)
Alkaline Phosphatase: 218 U/L — ABNORMAL HIGH (ref 38–126)
Bilirubin, Direct: 5.9 mg/dL — ABNORMAL HIGH (ref 0.0–0.2)
Indirect Bilirubin: 4.1 mg/dL — ABNORMAL HIGH (ref 0.3–0.9)
Total Bilirubin: 10 mg/dL — ABNORMAL HIGH (ref 0.3–1.2)
Total Protein: 5.6 g/dL — ABNORMAL LOW (ref 6.5–8.1)

## 2019-02-03 LAB — GLUCOSE, CAPILLARY
Glucose-Capillary: 157 mg/dL — ABNORMAL HIGH (ref 70–99)
Glucose-Capillary: 163 mg/dL — ABNORMAL HIGH (ref 70–99)
Glucose-Capillary: 171 mg/dL — ABNORMAL HIGH (ref 70–99)
Glucose-Capillary: 150 mg/dL — ABNORMAL HIGH (ref 70–99)
Glucose-Capillary: 147 mg/dL — ABNORMAL HIGH (ref 70–99)
Glucose-Capillary: 125 mg/dL — ABNORMAL HIGH (ref 70–99)

## 2019-02-03 LAB — MAGNESIUM: Magnesium: 2.2 mg/dL (ref 1.7–2.4)

## 2019-02-03 LAB — FOLATE: Folate: 11.7 ng/mL (ref >5.9)

## 2019-02-03 MED ORDER — LACTULOSE 10 GM/15ML PO SOLN
20.0000 g | Freq: Four times a day (QID) | ORAL | Status: DC
  Administered 2019-02-03 – 2019-02-04 (×3): 20 g
  Filled 2019-02-03 (×3): qty 30

## 2019-02-03 NOTE — Progress Notes (Signed)
NAME:  Patrick Kidd, MRN:  [id number], DOB:  [birth year], LOS: 5 ADMISSION DATE:  01/28/2019, CONSULTATION DATE:  01/31/29 REFERRING MD:  Shelly Coss, MD, CHIEF COMPLAINT:  Alcohol withdrawal  Brief History   Patrick Kidd is a 73 year old gentleman with a history of decompensated cirrhosis with ascites secondary to alcohol presented on 12/25 with jaundice and abdominal pain.  He drinks 16 ounces of vodka per day at least.  He was also recently Covid positive on 11/30 resented to the ED with shortness of breath, he was treated with prednisone as an outpatient.  He had ascites on admission, and there was concern for SBP which was confirmed on paracentesis with a PMN count greater than 45,000.  Over the last 24 hours he has had worsening agitated delirium secondary to alcohol withdrawal and since this morning has received over 9 mg of Ativan.  PCCM is being consulted to assist with alcohol withdrawal and agitated delirium.  Consults:  PCCM   Procedures:  Paracentesis 12/28 >> SBP Echocardiogram 12/25 shows grade 1 diastolic dysfunction  Micro Data:  12/28 peritoneal fluid culture negative 12/26 blood cultures negative 12/27 blood cultures negative  Antimicrobials:  Zosyn >>12/25>>12/28 Augmentin >> 12/28 >> Flagyl>>12/29>>12/29 Unasyn 12/29>>   Interim history/subjective:  Overnight NG tube placed. Had one BM yesterday but otherwise none. Resting comfortably on precedex gtt thirsty  Objective   Blood pressure (!) 124/50, pulse 76, temperature 98.2 F (36.8 C), temperature source Axillary, resp. rate (!) 24, height 6\' 2"  (1.88 m), weight 110.5 kg, SpO2 98 %.        Intake/Output Summary (Last 24 hours) at 02/03/2019 0804 Last data filed at 02/03/2019 0700 Gross per 24 hour  Intake 1213.99 ml  Output 755 ml  Net 458.99 ml   Filed Weights   01/31/19 0500 02/01/19 0500 02/02/19 0500  Weight: 112.6 kg 110.6 kg 110.5 kg    Examination: General:somnolent, responsive  to questions, not agitated Lungs: Clear to auscultation bilaterally Cardiovascular:  S1-S2 appreciated Abdomen:  Distended, nontender Extremities:  No edema Neuro:  Following commands MSK: jaundiced  Assessment & Plan:  Patrick Kidd is a 73 year old gentleman with a history of decompensated alcoholic cirrhosis who presents with:  Acute encephalopathy secondary to alcohol withdrawal syndrome with delirium, hepatic encephalopathy Spontaneous bacterial peritonitis Decompensated cirrhosis secondary to alcohol use Hyperbilirubinemia, obstructive pattern, cholestasis vs acute cholecystitis. -Bilirubin levels improving  Coagulopathy of liver disease  Day 3 of Unasyn for SBP  Maintain precedex infusion to be titrated to RAAS 0 to -1. Minimize prn ativan.  We will continue lactulose, was increased to 4 times daily  Continue rifaximin and zinc.    Best practice:  Diet: N.p.o. for mental status, okay to advance diet if alert. Pain/Anxiety/Delirium protocol (if indicated): Limit benzos DVT prophylaxis: heparin sub cu. Hold if bleeding or platelets less than 50k GI prophylaxis: Pantoprazole Glucose control: Controlled Mobility: Out of bed as tolerated, currently agitated delirium Code Status: Full Family Communication: per primary Disposition: Step Down  Labs   CBC: Recent Labs  Lab 01/28/19 1833 01/30/19 0332 01/31/19 0600 02/01/19 0244 02/02/19 0133 02/03/19 0203  WBC 15.4* 21.5* 27.4* 19.8* 15.4* 15.7*  NEUTROABS 12.2*  --   --   --   --   --   HGB 12.8* 11.9* 12.3* 11.2* 10.4* 11.3*  HCT 36.4* 35.1* 35.7* 33.0* 30.5* 33.8*  MCV 96.0 99.2 98.3 99.4 98.1 100.6*  PLT 97* 125* 106* 108* 107* 117*    Basic Metabolic Panel: Recent Labs  Lab 01/28/19 1833 01/30/19 0332 01/31/19 0600 02/01/19 0244 02/02/19 0133 02/03/19 0203  NA 124* 131* 132* 134* 135 137  K 4.7 4.5 3.8 3.6 3.6 3.5  CL 86* 89* 94* 93* 98 99  CO2 24 23 23 27 27 26   GLUCOSE 514* 272* 203* 222* 142*  164*  BUN 29* 31* 32* 31* 29* 35*  CREATININE 1.15 0.72 0.97 0.94 0.88 0.71  CALCIUM 8.3* 7.5* 7.7* 8.1* 8.0* 7.9*  MG 1.5*  --   --   --  2.0 2.2   GFR: Estimated Creatinine Clearance: 107.1 mL/min (by C-G formula based on SCr of 0.71 mg/dL). Recent Labs  Lab 01/29/19 0626 01/31/19 0600 02/01/19 0244 02/02/19 0133 02/03/19 0203  PROCALCITON 3.89 2.76 2.10  --   --   WBC 12.5* 27.4* 19.8* 15.4* 15.7*    Liver Function Tests: Recent Labs  Lab 01/30/19 0332 01/31/19 0600 02/01/19 0244 02/02/19 0133 02/03/19 0203  AST 56* 63* 48* 37 47*  ALT 31 37 28 24 25   ALKPHOS 253* 304* 267* 228* 218*  BILITOT 13.6* 12.3* 10.8* 10.5* 10.0*  PROT 5.9* 5.5* 6.2* 5.5* 5.6*  ALBUMIN 2.0* 2.0* 3.3* 2.6* 2.7*   Recent Labs  Lab 01/30/19 0332  LIPASE 13   Recent Labs  Lab 01/29/19 1143 02/01/19 1211  AMMONIA 31 35   Coagulation Profile: Recent Labs  Lab 01/30/19 0332 01/31/19 0600 02/01/19 0244 02/02/19 0133 02/03/19 0203  INR 1.3* 1.3* 1.4* 1.2 1.2    HbA1C: Hgb A1c MFr Bld  Date/Time Value Ref Range Status  01/31/2019 06:00 AM 8.9 (H) 4.8 - 5.6 % Final    Comment:    (NOTE) Pre diabetes:          5.7%-6.4% Diabetes:              >6.4% Glycemic control for   <7.0% adults with diabetes   11/22/2018 10:30 AM 6.7 (H) 4.6 - 6.5 % Final    Comment:    Glycemic Control Guidelines for People with Diabetes:Non Diabetic:  <6%Goal of Therapy: <7%Additional Action Suggested:  >8%     CBG: Recent Labs  Lab 02/02/19 1148 02/02/19 1543 02/02/19 1925 02/02/19 2334 02/03/19 0331  GLUCAP 151* 185* 168* 146* 157*   The patient is critically ill with multiple organ systems failure and requires high complexity decision making for assessment and support, frequent evaluation and titration of therapies, application of advanced monitoring technologies and extensive interpretation of multiple databases. Critical Care Time devoted to patient care services described in this note  independent of APP/resident time (if applicable)  is 30 minutes.   Sherrilyn Rist MD [facility] Personal pager: [phone] If unanswered, please page CCM On-call: [phone]

## 2019-02-03 NOTE — Progress Notes (Signed)
Pt pulled NG tube out, Dr. Tawanna Solo notified, d/t pts improved mental status, ok to leave NG tube out.

## 2019-02-03 NOTE — Progress Notes (Signed)
OT Cancellation Note  Patient Details Name: Patrick Kidd MRN: [id number] DOB: 10-08-1945   Cancelled Treatment:    Reason Eval/Treat Not Completed: Other (comment).  Checked with nurse. Pt is improving, but not yet ready for OT. Will reattempt tomorrow as schedule permits.  Willie Plain 02/03/2019, 11:28 AM  Karsten Ro, OTR/L Acute Rehabilitation Services 02/03/2019

## 2019-02-03 NOTE — Progress Notes (Addendum)
[facility] Progress Note  CC:  Cirrhosis, jaundice, HE  Subjective:  More awake today.  Several BM's overnight.  Asking for water.  No abdominal pain.  Objective:  Vital signs in last 24 hours: Temp:  [97.7 F (36.5 C)-99 F (37.2 C)] 98.1 F (36.7 C) (12/31 0800) Pulse Rate:  [58-81] 77 (12/31 0800) Resp:  [19-30] 24 (12/31 0800) BP: (105-141)/(43-67) 141/49 (12/31 0800) SpO2:  [95 %-99 %] 98 % (12/31 0800) Last BM Date: 02/02/19 General:  More awake/alert today.  Jaundice noted. Heart:  Regular rate and rhythm; no murmurs Pulm:  CTAB.  No increased WOB. Abdomen:  Softly distended with ascites fluid.  BS present.  No TTP. Extremities:  Without edema. Neurologic:  Alert.  No asterixis noted.  Intake/Output from previous day: 12/30 0701 - 12/31 0700 In: 1214 [I.V.:439; NG/GT:375; IV Piggyback:400] Out: 755 [Urine:755]  Lab Results: Recent Labs    02/01/19 0244 02/02/19 0133 02/03/19 0203  WBC 19.8* 15.4* 15.7*  HGB 11.2* 10.4* 11.3*  HCT 33.0* 30.5* 33.8*  PLT 108* 107* 117*   BMET Recent Labs    02/01/19 0244 02/02/19 0133 02/03/19 0203  NA 134* 135 137  K 3.6 3.6 3.5  CL 93* 98 99  CO2 27 27 26   GLUCOSE 222* 142* 164*  BUN 31* 29* 35*  CREATININE 0.94 0.88 0.71  CALCIUM 8.1* 8.0* 7.9*   LFT Recent Labs    02/03/19 0203  PROT 5.6*  ALBUMIN 2.7*  AST 47*  ALT 25  ALKPHOS 218*  BILITOT 10.0*  BILIDIR 5.9*  IBILI 4.1*   PT/INR Recent Labs    02/02/19 0133 02/03/19 0203  LABPROT 15.5* 14.9  INR 1.2 1.2   DG Abd 1 View  Result Date: 02/02/2019 CLINICAL DATA:  Initial evaluation for NG tube placement. EXAM: ABDOMEN - 1 VIEW COMPARISON:  Prior radiograph from 09/06/2015. FINDINGS: Enteric tube in place with tip seen overlying the region of the pylorus/proximal duodenum. Side hole well beyond the GE junction. Tip projects distally. Visualized bowel gas pattern within normal limits. IMPRESSION: Enteric tube in place with tip  overlying the region of the pylorus/proximal duodenum. Tip well beyond the GE junction. Electronically Signed   By: Jeannine Boga M.D.   On: 02/02/2019 00:45   Assessment / Plan: 60.  73 year old male with alcoholic cirrhosis, jaundice, RUQ pain and leukocytosis.A diagnostic paracentesis (113ml peritoneal fluid was removed)12/28 was positive for SBP with a PMN count greater than 45,000. He was previously on Zosyn which was changed to Augmentin po12/28. The hospitalist consulted ID andswitched him back to Zosyn 3.375 mg IV every 8 hoursas he was too sedated to take po Augmentin.Then changed to IV Unasyn on 12/29.  He receivedAlbumin100 g IV x1 dose postparacentesis.MRCP/MRI 12/28 showed asymmetric gallbladder wall thickening, no evidence of choledocholithiasis.LFTs and T. Bili levelsdrifting down slowly. Leukocytosis stable with WBC count of 15.7. He is afebrile.   2. Hepatic encephalopathy:  Now has NGT so lactulose changed to 30 gms every 6 hours via tube on 12/30.  Had a lot of BM's overnight and is much more awake this AM.  Will decrease lactulose to 20 grams 4 times daily and placed order for rectal tube if needed by nursing staff.  3. Thrombocytopenia secondary to cirrhosis. PLT 117.  4. Coagulopathy.INR 1.4 >>1.3>>1.2.  Stable today.  5.  Anemia:  Normocytic.  Iron studies normal as well as B12 and folate levels.  Hgb improved this AM.  -Is overdue for EGD  and colonoscopy surveillance so those will need to be done as an outpatient when patient recovers from these acute issues. -Ok for clear liquid diet.   LOS: 5 days   Laban Emperor. Zehr  02/03/2019, 9:04 AM   Attending physician's note   I have taken an interval history, reviewed the chart and examined the patient. I agree with the Advanced Practitioner's note, impression and recommendations.   Hepatic encephalopathy: more awake, following commands Continue lactulose through NG tube  SBP: on IV Unasyn,  await culture NGTD  Decompensated cirrhosis: LFT slowly trending MELD 17  Cr: Normal range  Clear liquids, advance diet as tolerated if mental status continues to improve  K. Denzil Magnuson , MD [phone]

## 2019-02-03 NOTE — Progress Notes (Signed)
PROGRESS NOTE    Patrick Kidd  [id number] DOB: 1945-07-09 DOA: 01/28/2019 PCP: [name], MD   Brief Narrative:  Patient is a 73 year old male with history of type 2 diabetes mellitus, alcoholic cirrhosis, portal hypertension, PVD, BPH, recent Covid-19  infection presented to the emergency department with right-sided epigastric abdominal pain.  He also reported generalized weakness, fatigue, jaundice.  He drinks about sixteen 8 ounce drinks vodka a day.  In the emergency department, he was found to have elevated liver enzymes.  CT abdomen/pelvis showed mild distended gallbladder with surrounding inflammatory changes, small pericholecystic fluid suggesting  possible cholecystitis.  General surgery was consulted and he is not a candidate for any kind of surgical procedure.  GI and PCCM also following him.  Currently on Precedex for alcohol withdrawal.  Underwent paracentesis and fluid analysis was suggestive of SBP.  Started on antibiotics  Assessment & Plan:   Principal Problem:   Acute cholecystitis Active Problems:   GERD   Type 2 diabetes mellitus with complication, with long-term current use of insulin (HCC)   Essential hypertension   Transaminitis   COVID-19 virus infection   Ascites   Jaundice   Leucocytosis   SBP (spontaneous bacterial peritonitis) (HCC)   Encephalopathy, hepatic (HCC)   Abdominal pain secondary to SBP/possible cholecystitis: CT abdomen/pelvis showed possible acute cholecystitis.  Right upper quadrant ultrasound showed gallstones, mild gallbladder wall thickening.general surgery was consulted and due to his child's class C cirrhosis he is not a candidate for surgical intervention.  General surgery thought that this is less likely a cholecystitis. Underwent diagnostic paracentesis by IR and fluid analysis showed SBP.  MRCP did not show any bile duct dilation, no choledocholithiasis, showed asymmetric gallbladder thickening, cirrhosis. Peritoneal  fluid culture has not shown any growth.  Blood cultures negative so far.  On Unasyn. GI following.  Decompensated alcohol cirrhosis/portal hypertension: CT abdomen showed hepatomegaly, cirrhosis, perihepatic ascites.  MELD score  of 28 on admission.  Mild elevated liver enzymes.  GI following.  On lactulose 4 times a day by NG tube.  Also on rifaximin, and zinc  Alcohol abuse disorder with withdrawals: On 12/28, he became more confused, agitated, tachycardic exhibiting signs of alcohol withdrawal symptoms.  PCCM following.  Started on Precedex.  Ativan as needed.  Continue thiamine, folic acid  GERD: Continue PPI  Type 2 diabetes mellitus: Hemoglobin A1c of 8.9 as per 12/20.  On Metformin as an outpatient.  Continue Lantus and sliding scale.  Hyponatremia: Much improved  Recent Covid-19 infection: Diagnosed on 01/03/2019, treated with steroids.  Currently respiratory status stable.  Not on airborne or contact isolation.  Weakness/debility: PT recommended skilled facility versus home health with 24-hour assistance.    Morbid obesity: BMI of 31.2.     Nutrition Problem: Increased nutrient needs Etiology: acute illness      DVT prophylaxis: Heparin [place] Code Status: Full Family Communication: Called wife on phone for updates on 02/03/19 Disposition Plan: Not clinically stable for discharge.   Consultants: None  Procedures: None  Antimicrobials:  Anti-infectives (From admission, onward)   Start     Dose/Rate Route Frequency Ordered Stop   02/02/19 1000  rifaximin (XIFAXAN) tablet 550 mg     550 mg Oral 2 times daily 02/02/19 0931     02/01/19 2200  rifaximin (XIFAXAN) tablet 200 mg  Status:  Discontinued     200 mg Oral 2 times daily 02/01/19 1550 02/02/19 0931   02/01/19 1800  Ampicillin-Sulbactam (UNASYN) 3 g in  sodium chloride 0.9 % 100 mL IVPB     3 g 200 mL/hr over 30 Minutes Intravenous Every 6 hours 02/01/19 1159     02/01/19 1600  metroNIDAZOLE (FLAGYL) IVPB 500 mg   Status:  Discontinued     500 mg 100 mL/hr over 60 Minutes Intravenous Every 8 hours 02/01/19 1550 02/01/19 1610   02/01/19 1000  amoxicillin-clavulanate (AUGMENTIN) 875-125 MG per tablet 1 tablet  Status:  Discontinued     1 tablet Oral Every 12 hours 02/01/19 0535 02/01/19 0930   02/01/19 1000  piperacillin-tazobactam (ZOSYN) IVPB 3.375 g  Status:  Discontinued     3.375 g 12.5 mL/hr over 240 Minutes Intravenous Every 8 hours 02/01/19 0935 02/01/19 1159   02/01/19 0530  cefTRIAXone (ROCEPHIN) 2 g in sodium chloride 0.9 % 100 mL IVPB  Status:  Discontinued     2 g 200 mL/hr over 30 Minutes Intravenous Every 24 hours 02/01/19 0532 02/01/19 0533   01/31/19 2200  amoxicillin-clavulanate (AUGMENTIN) 875-125 MG per tablet 1 tablet  Status:  Discontinued     1 tablet Oral Every 12 hours 01/31/19 1753 02/01/19 0532   01/29/19 0700  piperacillin-tazobactam (ZOSYN) IVPB 3.375 g  Status:  Discontinued     3.375 g 12.5 mL/hr over 240 Minutes Intravenous Every 8 hours 01/29/19 0403 01/31/19 1753   01/29/19 0030  piperacillin-tazobactam (ZOSYN) IVPB 3.375 g     3.375 g 100 mL/hr over 30 Minutes Intravenous  Once 01/29/19 0019 01/29/19 0123      Subjective:  Patient seen and examined the bedside this morning.  Hemodynamically stable.  Not alert, awake or oriented.  Opens his eyes on calling his name but wants to sleep instantly.  Not in distress  Objective: Vitals:   02/03/19 0600 02/03/19 0700 02/03/19 0800 02/03/19 0900  BP: 133/63 (!) 124/50 (!) 141/49 (!) 122/56  Pulse: 76 76 77 73  Resp: (!) 22 (!) 24 (!) 24 (!) 25  Temp:   98.1 F (36.7 C)   TempSrc:   Axillary   SpO2: 98% 98% 98% 97%  Weight:      Height:        Intake/Output Summary (Last 24 hours) at 02/03/2019 1314 Last data filed at 02/03/2019 0700 Gross per 24 hour  Intake 963.99 ml  Output 755 ml  Net 208.99 ml   Filed Weights   01/31/19 0500 02/01/19 0500 02/02/19 0500  Weight: 112.6 kg 110.6 kg 110.5 kg     Examination:  General exam:Sleepy,drowsy,obese HEENT: Ear/Nose normal on gross exam Respiratory system: Bilateral equal air entry, normal vesicular breath sounds, no wheezes or crackles  Cardiovascular system: S1 & S2 heard, RRR. No JVD, murmurs, rubs, gallops or clicks. No pedal edema. Gastrointestinal system: Abdomen is distended, soft and nontender.Ascites. No organomegaly or masses felt. Normal bowel sounds heard. Central nervous system:Not  Alert and oriented. No focal neurological deficits. Extremities: No edema, no clubbing ,no cyanosis, distal peripheral pulses palpable. Skin: Icterus,No rashes, lesions or ulcers,no pallor    Data Reviewed: I have personally reviewed following labs and imaging studies  CBC: Recent Labs  Lab 01/28/19 1833 01/30/19 0332 01/31/19 0600 02/01/19 0244 02/02/19 0133 02/03/19 0203  WBC 15.4* 21.5* 27.4* 19.8* 15.4* 15.7*  NEUTROABS 12.2*  --   --   --   --   --   HGB 12.8* 11.9* 12.3* 11.2* 10.4* 11.3*  HCT 36.4* 35.1* 35.7* 33.0* 30.5* 33.8*  MCV 96.0 99.2 98.3 99.4 98.1 100.6*  PLT 97* 125* 106*  108* 107* 123XX123*   Basic Metabolic Panel: Recent Labs  Lab 01/28/19 1833 01/30/19 0332 01/31/19 0600 02/01/19 0244 02/02/19 0133 02/03/19 0203  NA 124* 131* 132* 134* 135 137  K 4.7 4.5 3.8 3.6 3.6 3.5  CL 86* 89* 94* 93* 98 99  CO2 24 23 23 27 27 26   GLUCOSE 514* 272* 203* 222* 142* 164*  BUN 29* 31* 32* 31* 29* 35*  CREATININE 1.15 0.72 0.97 0.94 0.88 0.71  CALCIUM 8.3* 7.5* 7.7* 8.1* 8.0* 7.9*  MG 1.5*  --   --   --  2.0 2.2   GFR: Estimated Creatinine Clearance: 107.1 mL/min (by C-G formula based on SCr of 0.71 mg/dL). Liver Function Tests: Recent Labs  Lab 01/30/19 0332 01/31/19 0600 02/01/19 0244 02/02/19 0133 02/03/19 0203  AST 56* 63* 48* 37 47*  ALT 31 37 28 24 25   ALKPHOS 253* 304* 267* 228* 218*  BILITOT 13.6* 12.3* 10.8* 10.5* 10.0*  PROT 5.9* 5.5* 6.2* 5.5* 5.6*  ALBUMIN 2.0* 2.0* 3.3* 2.6* 2.7*   Recent  Labs  Lab 01/30/19 0332  LIPASE 13   Recent Labs  Lab 01/29/19 1143 02/01/19 1211  AMMONIA 31 35   Coagulation Profile: Recent Labs  Lab 01/30/19 0332 01/31/19 0600 02/01/19 0244 02/02/19 0133 02/03/19 0203  INR 1.3* 1.3* 1.4* 1.2 1.2   Cardiac Enzymes: Recent Labs  Lab 01/29/19 0500  CKTOTAL 12*  CKMB 1.6   BNP (last 3 results) No results for input(s): PROBNP in the last 8760 hours. HbA1C: No results for input(s): HGBA1C in the last 72 hours. CBG: Recent Labs  Lab 02/02/19 1543 02/02/19 1925 02/02/19 2334 02/03/19 0331 02/03/19 1257  GLUCAP 185* 168* 146* 157* 171*   Lipid Profile: No results for input(s): CHOL, HDL, LDLCALC, TRIG, CHOLHDL, LDLDIRECT in the last 72 hours. Thyroid Function Tests: No results for input(s): TSH, T4TOTAL, FREET4, T3FREE, THYROIDAB in the last 72 hours. Anemia Panel: Recent Labs    02/03/19 0203  VITAMINB12 2,026*  FOLATE 11.7  TIBC 116*  IRON 45   Sepsis Labs: Recent Labs  Lab 01/29/19 0626 01/31/19 0600 02/01/19 0244  PROCALCITON 3.89 2.76 2.10    Recent Results (from the past 240 hour(s))  Culture, blood (routine x 2)     Status: None   Collection Time: 01/29/19 12:23 AM   Specimen: BLOOD  Result Value Ref Range Status   Specimen Description   Final    BLOOD RIGHT ANTECUBITAL Performed at [facility], [place] [address], [place], [place] [address]    Special Requests   Final    BOTTLES DRAWN AEROBIC AND ANAEROBIC Blood Culture adequate volume Performed at [place] [address] [place], [place] [address]    Culture   Final    NO GROWTH 5 DAYS Performed at [facility], [place] [address], [place], [place] [address]    Report Status 02/03/2019 FINAL  Final  Culture, blood (routine x 2)     Status: None (Preliminary result)   Collection Time: 01/30/19  3:32 AM   Specimen: BLOOD  Result Value Ref Range Status   Specimen Description   Final    BLOOD LEFT  HAND Performed at [place] [address] [place], [place] [address]    Special Requests   Final    BOTTLES DRAWN AEROBIC ONLY Blood Culture adequate volume Performed at [place] [address] [place], [place] [address]    Culture  Final    NO GROWTH 4 DAYS Performed at [facility], [place] [address], [place], [place] [address]    Report Status PENDING  Incomplete  Aerobic/Anaerobic Culture (surgical/deep wound)     Status: None (Preliminary result)   Collection Time: 01/31/19  3:25 PM   Specimen: Abdomen; Peritoneal Fluid  Result Value Ref Range Status   Specimen Description   Final    PERITONEAL Performed at [place] [address], [place], [place] [address]    Special Requests   Final    NONE Performed at [facility], [place] [address], [place], [place] [address]    Gram Stain   Final    ABUNDANT WBC PRESENT, PREDOMINANTLY PMN NO ORGANISMS SEEN    Culture   Final    NO GROWTH 3 DAYS NO ANAEROBES ISOLATED; CULTURE IN PROGRESS FOR 5 DAYS Performed at [facility] [address], [place], [place] [address]    Report Status PENDING  Incomplete  MRSA PCR Screening     Status: None   Collection Time: 02/01/19  5:18 PM   Specimen: Nasal Mucosa; Nasopharyngeal  Result Value Ref Range Status   MRSA by PCR NEGATIVE NEGATIVE Final    Comment:        The GeneXpert MRSA Assay (FDA approved for NASAL specimens only), is one component of a comprehensive MRSA colonization surveillance program. It is not intended to diagnose MRSA infection nor to guide or monitor treatment for MRSA infections. Performed at [facility], [place] [address] [place], [place] [address]          Radiology Studies: DG Abd 1 View  Result Date: 02/02/2019 CLINICAL DATA:  Initial evaluation for NG tube placement. EXAM: ABDOMEN - 1 VIEW COMPARISON:  Prior radiograph from  09/06/2015. FINDINGS: Enteric tube in place with tip seen overlying the region of the pylorus/proximal duodenum. Side hole well beyond the GE junction. Tip projects distally. Visualized bowel gas pattern within normal limits. IMPRESSION: Enteric tube in place with tip overlying the region of the pylorus/proximal duodenum. Tip well beyond the GE junction. Electronically Signed   By: Jeannine Boga M.D.   On: 02/02/2019 00:45        Scheduled Meds: . Chlorhexidine Gluconate Cloth  6 each Topical Daily  . heparin injection (subcutaneous)  5,000 Units Subcutaneous Q8H  . insulin aspart  0-9 Units Subcutaneous Q4H  . insulin glargine  25 Units Subcutaneous Daily  . lactulose  20 g Per Tube Q6H  . mouth rinse  15 mL Mouth Rinse BID  . multivitamin  15 mL Per Tube Daily  . pantoprazole sodium  40 mg Oral BID  . rifaximin  550 mg Oral BID  . sodium chloride flush  3 mL Intravenous Q12H  . thiamine  100 mg Oral Daily   Or  . thiamine  100 mg Intravenous Daily  . zinc sulfate  220 mg Oral Daily   Continuous Infusions: . sodium chloride 250 mL (01/30/19 1420)  . sodium chloride 10 mL/hr at 02/03/19 0700  . ampicillin-sulbactam (UNASYN) IV 3 g (02/03/19 1200)  . dexmedetomidine (PRECEDEX) IV infusion 0.2 mcg/kg/hr (02/03/19 0733)     LOS: 5 days    Time spent: 35 mins.More than 50% of that time was spent in counseling and/or coordination of care.      Shelly Coss, MD [facility] Pager [phone]  If 7PM-7AM, please contact night-coverage [email or web address] Password [email or web address] 02/03/2019, 1:14 PM

## 2019-02-04 DIAGNOSIS — K7460 Unspecified cirrhosis of liver: Secondary | ICD-10-CM

## 2019-02-04 LAB — BASIC METABOLIC PANEL
Anion gap: 10 (ref 5–15)
Anion gap: 11 (ref 5–15)
BUN: 31 mg/dL — ABNORMAL HIGH (ref 8–23)
BUN: 29 mg/dL — ABNORMAL HIGH (ref 8–23)
CO2: 26 mmol/L (ref 22–32)
CO2: 26 mmol/L (ref 22–32)
Calcium: 7.9 mg/dL — ABNORMAL LOW (ref 8.9–10.3)
Calcium: 8.2 mg/dL — ABNORMAL LOW (ref 8.9–10.3)
Chloride: 105 mmol/L (ref 98–111)
Chloride: 103 mmol/L (ref 98–111)
Creatinine, Ser: 0.75 mg/dL (ref 0.61–1.24)
Creatinine, Ser: 0.97 mg/dL (ref 0.61–1.24)
GFR calc Af Amer: >60 mL/min (ref >60)
GFR calc Af Amer: >60 mL/min (ref >60)
GFR calc non Af Amer: >60 mL/min (ref >60)
GFR calc non Af Amer: >60 mL/min (ref >60)
Glucose, Bld: 130 mg/dL — ABNORMAL HIGH (ref 70–99)
Glucose, Bld: 232 mg/dL — ABNORMAL HIGH (ref 70–99)
Potassium: 3.2 mmol/L — ABNORMAL LOW (ref 3.5–5.1)
Potassium: 3.6 mmol/L (ref 3.5–5.1)
Sodium: 141 mmol/L (ref 135–145)
Sodium: 140 mmol/L (ref 135–145)

## 2019-02-04 LAB — GLUCOSE, CAPILLARY
Glucose-Capillary: 127 mg/dL — ABNORMAL HIGH (ref 70–99)
Glucose-Capillary: 194 mg/dL — ABNORMAL HIGH (ref 70–99)
Glucose-Capillary: 223 mg/dL — ABNORMAL HIGH (ref 70–99)
Glucose-Capillary: 198 mg/dL — ABNORMAL HIGH (ref 70–99)

## 2019-02-04 LAB — HEPATIC FUNCTION PANEL
ALT: 25 U/L (ref 0–44)
AST: 43 U/L — ABNORMAL HIGH (ref 15–41)
Albumin: 2.5 g/dL — ABNORMAL LOW (ref 3.5–5.0)
Alkaline Phosphatase: 216 U/L — ABNORMAL HIGH (ref 38–126)
Bilirubin, Direct: 3.5 mg/dL — ABNORMAL HIGH (ref 0.0–0.2)
Indirect Bilirubin: 3.3 mg/dL — ABNORMAL HIGH (ref 0.3–0.9)
Total Bilirubin: 6.8 mg/dL — ABNORMAL HIGH (ref 0.3–1.2)
Total Protein: 5.5 g/dL — ABNORMAL LOW (ref 6.5–8.1)

## 2019-02-04 LAB — CBC
HCT: 32.8 % — ABNORMAL LOW (ref 39.0–52.0)
Hemoglobin: 11.1 g/dL — ABNORMAL LOW (ref 13.0–17.0)
MCH: 33.9 pg (ref 26.0–34.0)
MCHC: 33.8 g/dL (ref 30.0–36.0)
MCV: 100.3 fL — ABNORMAL HIGH (ref 80.0–100.0)
Platelets: 122 10*3/uL — ABNORMAL LOW (ref 150–400)
RBC: 3.27 MIL/uL — ABNORMAL LOW (ref 4.22–5.81)
RDW: 14.7 % (ref 11.5–15.5)
WBC: 12 10*3/uL — ABNORMAL HIGH (ref 4.0–10.5)
nRBC: 0 % (ref 0.0–0.2)

## 2019-02-04 LAB — CULTURE, BLOOD (ROUTINE X 2)
Culture: NO GROWTH
Special Requests: ADEQUATE

## 2019-02-04 LAB — AMMONIA: Ammonia: 77 umol/L — ABNORMAL HIGH (ref 9–35)

## 2019-02-04 LAB — PROTIME-INR
INR: 1.2 (ref 0.8–1.2)
Prothrombin Time: 14.8 seconds (ref 11.4–15.2)

## 2019-02-04 LAB — MAGNESIUM
Magnesium: 2.1 mg/dL (ref 1.7–2.4)
Magnesium: 2.1 mg/dL (ref 1.7–2.4)

## 2019-02-04 MED ORDER — LACTULOSE 10 GM/15ML PO SOLN
30.0000 g | Freq: Three times a day (TID) | ORAL | Status: DC
  Administered 2019-02-04 – 2019-02-05 (×4): 30 g via ORAL
  Filled 2019-02-04 (×4): qty 45

## 2019-02-04 MED ORDER — DILTIAZEM HCL 25 MG/5ML IV SOLN
10.0000 mg | INTRAVENOUS | Status: DC | PRN
  Administered 2019-02-04: 22:00:00 10 mg via INTRAVENOUS
  Filled 2019-02-04 (×2): qty 5

## 2019-02-04 MED ORDER — ADULT MULTIVITAMIN W/MINERALS CH
1.0000 | ORAL_TABLET | Freq: Every day | ORAL | Status: DC
  Administered 2019-02-04 – 2019-02-09 (×6): 1 via ORAL
  Filled 2019-02-04 (×6): qty 1

## 2019-02-04 MED ORDER — MORPHINE SULFATE (PF) 2 MG/ML IV SOLN
2.0000 mg | INTRAVENOUS | Status: DC | PRN
  Administered 2019-02-04 – 2019-02-08 (×10): 2 mg via INTRAVENOUS
  Filled 2019-02-04 (×10): qty 1

## 2019-02-04 MED ORDER — PANTOPRAZOLE SODIUM 40 MG PO TBEC
40.0000 mg | DELAYED_RELEASE_TABLET | Freq: Two times a day (BID) | ORAL | Status: DC
  Administered 2019-02-04 – 2019-02-09 (×11): 40 mg via ORAL
  Filled 2019-02-04 (×11): qty 1

## 2019-02-04 MED ORDER — DILTIAZEM HCL 25 MG/5ML IV SOLN
10.0000 mg | Freq: Once | INTRAVENOUS | Status: AC
  Administered 2019-02-04: 10 mg via INTRAVENOUS
  Filled 2019-02-04: qty 5

## 2019-02-04 MED ORDER — MORPHINE SULFATE (PF) 2 MG/ML IV SOLN
INTRAVENOUS | Status: AC
  Filled 2019-02-04: qty 1

## 2019-02-04 MED ORDER — FUROSEMIDE 40 MG PO TABS
40.0000 mg | ORAL_TABLET | Freq: Every day | ORAL | Status: DC
  Administered 2019-02-04 – 2019-02-05 (×2): 40 mg via ORAL
  Filled 2019-02-04 (×2): qty 1

## 2019-02-04 MED ORDER — SPIRONOLACTONE 25 MG PO TABS
50.0000 mg | ORAL_TABLET | Freq: Every day | ORAL | Status: DC
  Administered 2019-02-04 – 2019-02-06 (×3): 50 mg via ORAL
  Filled 2019-02-04 (×3): qty 2

## 2019-02-04 MED ORDER — POTASSIUM CHLORIDE CRYS ER 20 MEQ PO TBCR
40.0000 meq | EXTENDED_RELEASE_TABLET | Freq: Every day | ORAL | Status: DC
  Administered 2019-02-04 – 2019-02-06 (×3): 40 meq via ORAL
  Filled 2019-02-04 (×3): qty 2

## 2019-02-04 MED ORDER — POTASSIUM CHLORIDE CRYS ER 20 MEQ PO TBCR
40.0000 meq | EXTENDED_RELEASE_TABLET | Freq: Once | ORAL | Status: AC
  Administered 2019-02-04: 07:00:00 40 meq via ORAL
  Filled 2019-02-04: qty 2

## 2019-02-04 MED ORDER — AMOXICILLIN-POT CLAVULANATE 875-125 MG PO TABS
1.0000 | ORAL_TABLET | Freq: Two times a day (BID) | ORAL | Status: DC
  Administered 2019-02-04 – 2019-02-09 (×11): 1 via ORAL
  Filled 2019-02-04 (×11): qty 1

## 2019-02-04 MED ORDER — POTASSIUM CHLORIDE 10 MEQ/100ML IV SOLN
10.0000 meq | INTRAVENOUS | Status: DC
  Administered 2019-02-04 (×2): 10 meq via INTRAVENOUS
  Filled 2019-02-04 (×2): qty 100

## 2019-02-04 NOTE — Evaluation (Signed)
Occupational Therapy Evaluation Patient Details Name: Patrick Kidd MRN: [id number] DOB: [birth year] Today's Date: 02/04/2019    History of Present Illness 74 year old Caucasian male with past medical history remarkable for type 2 diabetes mellitus, EtOH cirrhosis, portal hypertension, PUD, BPH, recent Covid-19 infection (01/03/2019)  that was treated with steroids who presented to the ED with right-sided epigastric abdominal pain.  He also reported generalized weakness, fatigue and yellowing to his skin.  He has been drinking vodka, reports roughly sixteen 8 ounce drinks per day. Pt with severe alcohol withdrawal since admission.   Clinical Impression   Pt was ambulating with a cane due to knee pain, but reports being independent in self care. Pt presents with generalized weakness, impaired standing balance, and poor activity tolerance. Pt with slow processing speed at time and poor insight into his ability to his ability to return home at his current level of functioning. He requires set up to moderate assistance for ADL. Recommending post acute rehab in SNF prior to return home.     Follow Up Recommendations  SNF;Supervision/Assistance - 24 hour    Equipment Recommendations  (defer to next venue)    Recommendations for Other Services       Precautions / Restrictions Precautions Precautions: Fall Restrictions Weight Bearing Restrictions: No      Mobility Bed Mobility               General bed mobility comments: pt received in chair  Transfers Overall transfer level: Needs assistance Equipment used: Rolling walker (2 wheeled) Transfers: Sit to/from Stand Sit to Stand: +2 physical assistance;Mod assist         General transfer comment: assist to rise and stabilize in standing, cues for hand placement    Balance Overall balance assessment: Needs assistance Sitting-balance support: No upper extremity supported;Feet supported Sitting balance-Leahy Scale: Fair        Standing balance-Leahy Scale: Poor Standing balance comment: reliant on UEs                           ADL either performed or assessed with clinical judgement   ADL Overall ADL's : Needs assistance/impaired Eating/Feeding: Independent;Sitting   Grooming: Set up;Sitting   Upper Body Bathing: Moderate assistance;Sitting   Lower Body Bathing: Moderate assistance;Sit to/from stand;Sitting/lateral leans   Upper Body Dressing : Minimal assistance;Sitting   Lower Body Dressing: Moderate assistance;Maximal assistance;Sitting/lateral leans;Sit to/from stand   Toilet Transfer: Minimal assistance;+2 for safety/equipment;Ambulation;RW           Functional mobility during ADLs: Minimal assistance;+2 for safety/equipment;Rolling walker General ADL Comments: Pt with very poor activity tolerance.     Vision         Perception     Praxis      Pertinent Vitals/Pain Pain Assessment: Faces Faces Pain Scale: Hurts even more Pain Location: R knee Pain Descriptors / Indicators: Aching Pain Intervention(s): Monitored during session;Repositioned     Hand Dominance Right   Extremity/Trunk Assessment Upper Extremity Assessment Upper Extremity Assessment: Generalized weakness   Lower Extremity Assessment Lower Extremity Assessment: Defer to PT evaluation       Communication Communication Communication: No difficulties   Cognition Arousal/Alertness: Awake/alert Behavior During Therapy: WFL for tasks assessed/performed Overall Cognitive Status: Impaired/Different from baseline Area of Impairment: Problem solving;Safety/judgement                         Safety/Judgement: Decreased awareness of deficits   Problem  Solving: Slow processing     General Comments       Exercises     Shoulder Instructions      Home Living Family/patient expects to be discharged to:: Private residence Living Arrangements: Spouse/significant other;Children Available  Help at Discharge: Family Type of Home: House Home Access: Stairs to enter Technical brewer of Steps: 1   Home Layout: One level     Bathroom Shower/Tub: Occupational psychologist: [place] - single point;Walker - 2 wheels          Prior Functioning/Environment Level of Independence: Independent with assistive device(s)        Comments: recently amb with cane d/t knee pain        OT Problem List: Decreased strength;Decreased activity tolerance;Impaired balance (sitting and/or standing);Decreased knowledge of use of DME or AE;Pain;Decreased cognition;Obesity      OT Treatment/Interventions: Self-care/ADL training;Energy conservation;Therapeutic activities;Cognitive remediation/compensation;Patient/family education;Balance training    OT Goals(Current goals can be found in the care plan section) Acute Rehab OT Goals Patient Stated Goal: to go home OT Goal Formulation: With patient Time For Goal Achievement: 02/18/19 Potential to Achieve Goals: Good ADL Goals Pt Will Perform Grooming: with min guard assist;standing(1 activity) Pt Will Perform Lower Body Bathing: sit to/from stand;with mod assist Pt Will Perform Lower Body Dressing: with mod assist;sit to/from stand Pt Will Transfer to Toilet: with min guard assist;ambulating;bedside commode Pt Will Perform Toileting - Clothing Manipulation and hygiene: with min assist;sit to/from stand Additional ADL Goal #1: Pt will tolerate 10 minutes of ADL in sitting, demonstrating improved activity tolerance.  OT Frequency: Min 2X/week   Barriers to D/C:            Co-evaluation PT/OT/SLP Co-Evaluation/Treatment: Yes Reason for Co-Treatment: For patient/therapist safety   OT goals addressed during session: ADL's and self-care      AM-PAC OT "6 Clicks" Daily Activity     Outcome Measure Help from another person eating meals?: None Help from another person taking care of personal  grooming?: A Little Help from another person toileting, which includes using toliet, bedpan, or urinal?: A Lot Help from another person bathing (including washing, rinsing, drying)?: A Lot Help from another person to put on and taking off regular upper body clothing?: A Little Help from another person to put on and taking off regular lower body clothing?: A Lot 6 Click Score: 16   End of Session Equipment Utilized During Treatment: Rolling walker;Gait belt Nurse Communication: Mobility status  Activity Tolerance: Patient limited by fatigue Patient left: in chair;with call bell/phone within reach;with nursing/sitter in room  OT Visit Diagnosis: Unsteadiness on feet (R26.81);Other abnormalities of gait and mobility (R26.89);Pain;Muscle weakness (generalized) (M62.81);Other symptoms and signs involving cognitive function                Time: [id number] OT Time Calculation (min): 22 min Charges:  OT General Charges $OT Visit: 1 Visit OT Evaluation $OT Eval Moderate Complexity: 1 Mod  Nestor Lewandowsky, OTR/L Acute Rehabilitation Services Pager: [phone] Office: [phone]  Malka So 02/04/2019, 11:45 AM

## 2019-02-04 NOTE — Progress Notes (Signed)
NAME:  BOE GOFFREDO, MRN:  [id number], DOB:  08/15/1945, LOS: 6 ADMISSION DATE:  01/28/2019, CONSULTATION DATE:  01/31/29 REFERRING MD:  Shelly Coss, MD, CHIEF COMPLAINT:  Alcohol withdrawal  Brief History   [name] is a 74 year old gentleman with a history of decompensated cirrhosis with ascites secondary to alcohol presented on 12/25 with jaundice and abdominal pain.  He drinks 16 ounces of vodka per day at least.  He was also recently Covid positive on 11/30 resented to the ED with shortness of breath, he was treated with prednisone as an outpatient.  He had ascites on admission, and there was concern for SBP which was confirmed on paracentesis with a PMN count greater than 45,000.  Over the last 24 hours he has had worsening agitated delirium secondary to alcohol withdrawal and since this morning has received over 9 mg of Ativan.  PCCM is being consulted to assist with alcohol withdrawal and agitated delirium.  Consults:  PCCM   Procedures:  Paracentesis 12/28 >> SBP Echocardiogram 12/25 shows grade 1 diastolic dysfunction  Micro Data:  12/28 peritoneal fluid culture negative 12/26 blood cultures negative 12/27 blood cultures negative  Antimicrobials:  Zosyn >>12/25>>12/28 Augmentin >> 12/28 >> Flagyl>>12/29>>12/29 Unasyn 12/29>>   Interim history/subjective:  Weaned off Precedex overnight Feels calm, no agitation No pain or discomfort  Objective   Blood pressure (!) 170/74, pulse 87, temperature 98.5 F (36.9 C), resp. rate (!) 25, height 6\' 2"  (1.88 m), weight 110.5 kg, SpO2 97 %.        Intake/Output Summary (Last 24 hours) at 02/04/2019 0947 Last data filed at 02/04/2019 0300 Gross per 24 hour  Intake 1185.04 ml  Output 950 ml  Net 235.04 ml   Filed Weights   01/31/19 0500 02/01/19 0500 02/02/19 0500  Weight: 112.6 kg 110.6 kg 110.5 kg    Examination: General:s awake and alert, responsive to questions not agitated Lungs: Clear breath sounds  bilaterally Cardiovascular:  S1-S2 appreciated Abdomen:  Distended, nontender Extremities:  No edema Neuro:  Following commands MSK: jaundiced  Assessment & Plan:  74 year old gentleman with history of decompensated alcoholic cirrhosis presented with Acute encephalopathy secondary to alcohol withdrawal syndrome with delirium, hepatic encephalopathy Spontaneous bacterial peritonitis Decompensated cirrhosis secondary to alcohol use Hyperbilirubinemia, obstructive pattern, cholestasis versus acute cholecystitis-bilirubin levels improving  Coagulopathy of liver disease  Day 4 of Unasyn for SBP  Continue rifaximin and zinc   Continue lactulose  Best practice:  Diet: Advance diet as tolerated Pain/Anxiety/Delirium protocol (if indicated): Limit benzos DVT prophylaxis: heparin sub cu. Hold if bleeding or platelets less than 50k GI prophylaxis: Pantoprazole Glucose control: Controlled Mobility: Out of bed as tolerated, currently agitated delirium Code Status: Full Family Communication: per primary Disposition: Step Down  Labs   CBC: Recent Labs  Lab 01/28/19 1833 01/31/19 0600 02/01/19 0244 02/02/19 0133 02/03/19 0203 02/04/19 0217  WBC 15.4* 27.4* 19.8* 15.4* 15.7* 12.0*  NEUTROABS 12.2*  --   --   --   --   --   HGB 12.8* 12.3* 11.2* 10.4* 11.3* 11.1*  HCT 36.4* 35.7* 33.0* 30.5* 33.8* 32.8*  MCV 96.0 98.3 99.4 98.1 100.6* 100.3*  PLT 97* 106* 108* 107* 117* 122*    Basic Metabolic Panel: Recent Labs  Lab 01/28/19 1833 01/31/19 0600 02/01/19 0244 02/02/19 0133 02/03/19 0203 02/04/19 0217  NA 124* 132* 134* 135 137 141  K 4.7 3.8 3.6 3.6 3.5 3.2*  CL 86* 94* 93* 98 99 105  CO2 24 23 27  27 26 26   GLUCOSE 514* 203* 222* 142* 164* 130*  BUN 29* 32* 31* 29* 35* 31*  CREATININE 1.15 0.97 0.94 0.88 0.71 0.75  CALCIUM 8.3* 7.7* 8.1* 8.0* 7.9* 7.9*  MG 1.5*  --   --  2.0 2.2 2.1   GFR: Estimated Creatinine Clearance: 107.1 mL/min (by C-G formula based on SCr  of 0.75 mg/dL). Recent Labs  Lab 01/29/19 0626 01/31/19 0600 02/01/19 0244 02/02/19 0133 02/03/19 0203 02/04/19 0217  PROCALCITON 3.89 2.76 2.10  --   --   --   WBC 12.5* 27.4* 19.8* 15.4* 15.7* 12.0*    Liver Function Tests: Recent Labs  Lab 01/31/19 0600 02/01/19 0244 02/02/19 0133 02/03/19 0203 02/04/19 0217  AST 63* 48* 37 47* 43*  ALT 37 28 24 25 25   ALKPHOS 304* 267* 228* 218* 216*  BILITOT 12.3* 10.8* 10.5* 10.0* 6.8*  PROT 5.5* 6.2* 5.5* 5.6* 5.5*  ALBUMIN 2.0* 3.3* 2.6* 2.7* 2.5*   Recent Labs  Lab 01/30/19 0332  LIPASE 13   Recent Labs  Lab 01/29/19 1143 02/01/19 1211 02/04/19 0217  AMMONIA 31 35 77*   Coagulation Profile: Recent Labs  Lab 01/31/19 0600 02/01/19 0244 02/02/19 0133 02/03/19 0203 02/04/19 0217  INR 1.3* 1.4* 1.2 1.2 1.2    HbA1C: Hgb A1c MFr Bld  Date/Time Value Ref Range Status  01/31/2019 06:00 AM 8.9 (H) 4.8 - 5.6 % Final    Comment:    (NOTE) Pre diabetes:          5.7%-6.4% Diabetes:              >6.4% Glycemic control for   <7.0% adults with diabetes   11/22/2018 10:30 AM 6.7 (H) 4.6 - 6.5 % Final    Comment:    Glycemic Control Guidelines for People with Diabetes:Non Diabetic:  <6%Goal of Therapy: <7%Additional Action Suggested:  >8%     CBG: Recent Labs  Lab 02/03/19 1257 02/03/19 1557 02/03/19 2011 02/03/19 2317 02/04/19 0801  GLUCAP 171* 150* 147* 125* 127*   Sherrilyn Rist, MD [place], PCCM Cell: [phone]

## 2019-02-04 NOTE — Progress Notes (Addendum)
[facility] Progress Note  CC:  Decompensated cirrhosis, jaundice, HE  Subjective: Patient is sitting up in chair. He is alert and oriented x 3.He is off Precedex.  He denies having any nausea or abdominal pain. He wants to go home. He had runs of atrial fibrillation yesterday, monitor currently showing NSR. No cp or SOB. NGT dc'd. Tolerating po Lactulose. RN reports he is passing one soft to loose brown BM Q shfit.   Objective:  Vital signs in last 24 hours: Temp:  [97.9 F (36.6 C)-98.5 F (36.9 C)] 98.5 F (36.9 C) (01/01 0800) Pulse Rate:  [66-87] 87 (01/01 0800) Resp:  [22-25] 25 (01/01 0800) BP: (122-170)/(46-74) 170/74 (01/01 0800) SpO2:  [94 %-98 %] 97 % (01/01 0800) Last BM Date: 02/03/19 General:   Alert 74 year old male sitting up in chair in NAD. Eyes: scleral icterus present, PERRLA.  Heart: rhythm mostly regular, briefly irregular on exam, no murmur. Pulm: Bibasilar crackles otherwise clear. Abdomen: Protuberant, distended but not tense with ascites, umbilical hernia, nontender, hypoactive BS x 4 quads.  Extremities:  Without edema. Neurologic:  Alert and  oriented x4;  grossly normal neurologically. Psych:  Alert and cooperative. Normal mood and affect.  Intake/Output from previous day: 12/31 0701 - 01/01 0700 In: 1185 [P.O.:620; I.V.:165; IV Piggyback:400] Out: 950 [Urine:950] Intake/Output this shift: No intake/output data recorded.  Lab Results: Recent Labs    02/02/19 0133 02/03/19 0203 02/04/19 0217  WBC 15.4* 15.7* 12.0*  HGB 10.4* 11.3* 11.1*  HCT 30.5* 33.8* 32.8*  PLT 107* 117* 122*   BMET Recent Labs    02/02/19 0133 02/03/19 0203 02/04/19 0217  NA 135 137 141  K 3.6 3.5 3.2*  CL 98 99 105  CO2 _0 GLUCOSE 142* 164* 130*  BUN 29* 35* 31*  CREATININE 0.88 0.71 0.75  CALCIUM 8.0* 7.9* 7.9*   LFT Recent Labs    02/04/19 0217  PROT 5.5*  ALBUMIN 2.5*  AST 43*  ALT 25  ALKPHOS 216*  BILITOT 6.8*    BILIDIR 3.5*  IBILI 3.3*   PT/INR Recent Labs    02/03/19 0203 02/04/19 0217  LABPROT 14.9 14.8  INR 1.2 1.2   Hepatitis Panel No results for input(s): HEPBSAG, HCVAB, HEPAIGM, HEPBIGM in the last 72 hours.  No results found.  Assessment / Plan:  97. 74 year old male with alcoholic cirrhosis, jaundice, RUQ pain and leukocytosis. A diagnostic paracentesis (145m peritoneal fluid was removed) 12/28 was positive for SBP with a PMN count greater than 45,000. Peritoneal fluid cx no growth day # 3. Now on Unasyn IV day # 3. He received Albumin 100 g IV x1 post paracentesis. MRCP/MRI 12/28 showed asymmetric gallbladder wall thickening, no evidence of choledocholithiasis.  LFTs and T. Bili levels drifting downward.  Alk phos 216. AST 43.  ALT 25.  Direct bili 3.5.  Indirect bili 3.3. WBC 12.  He is afebrile. -Repeat abdominal ultrasound in the next few days to assess if enough ascites to repeat paracentesis to assess for resolution of SBP -Continue Unasyn 3 g IV every 6 hours. -Start Augmentin 8732mpo bid x 10 days when discharged home then Cipro 50051m po QD -GI follow up in 2 to 4 weeks once discharged home -Eventual surveillance EGD for Barrett's esophagus and esophageal varices as outpatient  2. Hepatic encephalopathy, transferred to ICU stepdown 12/29 due to AMS. HE improving after lactulose enemas and lactulose per NGT.  Ammonia 77. He is alert  and oriented today sitting up in the chair. Off Precedex. -continue Lactulose 30gm po tid -continue Xifaxan 518m po bid -monitor neuro status closely   3.  Normocytic anemia.  Repeat iron levels were normal.  No evidence of active GI bleeding.  Hemoglobin 11.1.  Hematocrit 32.8.  MCV 100.3.  Iron 45.  TIBC 116.  Vitamin B12 2026.  4. Thrombocytopenia secondary to cirrhosis.  Platelet 122.  5. Coagulopathy secondary to cirrhosis, stable.  INR 1.2.  6.  History of colon polyps -Patient to have surveillance colonoscopy at the time of his  surveillance EGD as outpatient  7.  Hypokalemia.  Potassium 3.2. -KCl replacement per hospitalist  8. Afib, RN reported run of afib this am   Further recommendations per Dr. NSilverio Decamp Principal Problem:   Acute cholecystitis Active Problems:   GERD   Type 2 diabetes mellitus with complication, with long-term current use of insulin ([place]   Essential hypertension   Transaminitis   COVID-19 virus infection   Ascites   Jaundice   Leucocytosis   SBP (spontaneous bacterial peritonitis) ([place]   Encephalopathy, hepatic ([place]     LOS: 6 days   CNoralyn Pick 02/04/2019, 9:39 AM   Attending physician's note   I have taken an interval history, reviewed the chart and examined the patient. I agree with the Advanced Practitioner's note, impression and recommendations.   Clinically improving, more alert and oriented x2 He was responding appropriately and seems to be comprehending well  Compensated cirrhosis, LFT improving  SBP on IV Unasyn, leukocytosis is improving  Can transition to oral Augmentin to complete 10-day course, will need ciprofloxacin 500 mg once daily after completion of Augmentin for secondary prophylaxis of SBP  Continue Xifaxan 550 mg twice daily and lactulose 3 times daily with goal 3 soft bowel movements  Will need GI follow-up after discharge in 2 to 4 weeks  We will plan for outpatient EGD and colonoscopy once acute issues resolve  Available if have any questions or concerns  K. VDenzil Magnuson, MD [phone]

## 2019-02-04 NOTE — Progress Notes (Addendum)
PROGRESS NOTE    Patrick Kidd  [id number] DOB: 02/12/1945 DOA: 01/28/2019 PCP: [name], MD   Brief Narrative:  Patient is a 74 year old male with history of type 2 diabetes mellitus, alcoholic cirrhosis, portal hypertension, PVD, BPH, recent Covid-19  infection presented to the emergency department with right-sided epigastric abdominal pain.  He also reported generalized weakness, fatigue, jaundice.  He drinks about sixteen 8 ounce drinks vodka a day.  In the emergency department, he was found to have elevated liver enzymes.  CT abdomen/pelvis showed mild distended gallbladder with surrounding inflammatory changes, small pericholecystic fluid suggesting  possible cholecystitis.  General surgery was consulted and he is not a candidate for any kind of surgical procedure.  GI and PCCM were also following him.   Underwent paracentesis and fluid analysis was suggestive of SBP.  Started on antibiotics. Mental status has significantly improved today. Off precedex. Oral diet has been started.  Waiting for physical therapy /OT assessment today.  Assessment & Plan:   Principal Problem:   Acute cholecystitis Active Problems:   GERD   Type 2 diabetes mellitus with complication, with long-term current use of insulin (HCC)   Essential hypertension   Transaminitis   COVID-19 virus infection   Ascites   Jaundice   Leucocytosis   SBP (spontaneous bacterial peritonitis) (HCC)   Encephalopathy, hepatic (HCC)   Abdominal pain secondary to SBP/possible cholecystitis: CT abdomen/pelvis showed possible acute cholecystitis.  Right upper quadrant ultrasound showed gallstones, mild gallbladder wall thickening.general surgery was consulted and due to his child's class C cirrhosis he is not a candidate for surgical intervention.  General surgery thought that this is less likely a cholecystitis. Underwent diagnostic paracentesis by IR and fluid analysis showed SBP.  MRCP did not show any bile duct  dilation, no choledocholithiasis, showed asymmetric gallbladder thickening, cirrhosis. Peritoneal fluid culture has not shown any growth.  Blood cultures negative so far.  He was on Unasyn,changed to Augmentin which will be continued for 10 days and then he will be on ciprofloxacin 5 mg daily for SBP prophylaxis. He does not complain of any abdominal pain today.  Decompensated alcohol cirrhosis/portal hypertension: CT abdomen showed hepatomegaly, cirrhosis, perihepatic ascites.  MELD score  of 28 on admission.  Mild elevated liver enzymes.  GI following.  On lactulose 3 times a day.  Also on rifaximin, and zinc.  His abdomen is more distended today, I have requested for US paracentesis.  Started on Lasix and spironolactone.  GI following. Elevated ammonia level today.. We will check ammonia level tomorrow.  Alcohol abuse disorder with withdrawals: On 12/28, he became more confused, agitated, tachycardic exhibiting signs of alcohol withdrawal symptoms.  PCCM was following.  He was started on Precedex.  Off Precedex now.  Continue thiamine, folic acid  GERD: Continue PPI  Type 2 diabetes mellitus: Hemoglobin A1c of 8.9 as per 12/20.  On Metformin as an outpatient.  Continue Lantus and sliding scale.  Hyponatremia: Much improved  Recent Covid-19 infection: Diagnosed on 01/03/2019, treated with steroids.  Currently respiratory status stable.  Not on airborne or contact isolation.  Weakness/debility: PT recommended skilled facility versus home health with 24-hour assistance.  Waiting for reassessment.  Morbid obesity: BMI of 31.2.     Nutrition Problem: Increased nutrient needs Etiology: acute illness      DVT prophylaxis: Heparin Anderson Code Status: Full Family Communication: Called wife on phone for updates on 02/03/19 Disposition Plan: Likely home in next 24 to 48 hours.   Consultants: None  Procedures: None  Antimicrobials:  Anti-infectives (From admission, onward)   Start      Dose/Rate Route Frequency Ordered Stop   02/04/19 1000  amoxicillin-clavulanate (AUGMENTIN) 875-125 MG per tablet 1 tablet     1 tablet Oral Every 12 hours 02/04/19 0858     02/02/19 1000  rifaximin (XIFAXAN) tablet 550 mg     550 mg Oral 2 times daily 02/02/19 0931     02/01/19 2200  rifaximin (XIFAXAN) tablet 200 mg  Status:  Discontinued     200 mg Oral 2 times daily 02/01/19 1550 02/02/19 0931   02/01/19 1800  Ampicillin-Sulbactam (UNASYN) 3 g in sodium chloride 0.9 % 100 mL IVPB  Status:  Discontinued     3 g 200 mL/hr over 30 Minutes Intravenous Every 6 hours 02/01/19 1159 02/04/19 0858   02/01/19 1600  metroNIDAZOLE (FLAGYL) IVPB 500 mg  Status:  Discontinued     500 mg 100 mL/hr over 60 Minutes Intravenous Every 8 hours 02/01/19 1550 02/01/19 1610   02/01/19 1000  amoxicillin-clavulanate (AUGMENTIN) 875-125 MG per tablet 1 tablet  Status:  Discontinued     1 tablet Oral Every 12 hours 02/01/19 0535 02/01/19 0930   02/01/19 1000  piperacillin-tazobactam (ZOSYN) IVPB 3.375 g  Status:  Discontinued     3.375 g 12.5 mL/hr over 240 Minutes Intravenous Every 8 hours 02/01/19 0935 02/01/19 1159   02/01/19 0530  cefTRIAXone (ROCEPHIN) 2 g in sodium chloride 0.9 % 100 mL IVPB  Status:  Discontinued     2 g 200 mL/hr over 30 Minutes Intravenous Every 24 hours 02/01/19 0532 02/01/19 0533   01/31/19 2200  amoxicillin-clavulanate (AUGMENTIN) 875-125 MG per tablet 1 tablet  Status:  Discontinued     1 tablet Oral Every 12 hours 01/31/19 1753 02/01/19 0532   01/29/19 0700  piperacillin-tazobactam (ZOSYN) IVPB 3.375 g  Status:  Discontinued     3.375 g 12.5 mL/hr over 240 Minutes Intravenous Every 8 hours 01/29/19 0403 01/31/19 1753   01/29/19 0030  piperacillin-tazobactam (ZOSYN) IVPB 3.375 g     3.375 g 100 mL/hr over 30 Minutes Intravenous  Once 01/29/19 0019 01/29/19 0123      Subjective:  Patient seen and examined the bedside this morning.  Hemodynamically stable.  Sitting in the  chair.  Alert and oriented.  Comfortable.  Denies any complaints  Objective: Vitals:   02/04/19 0500 02/04/19 0600 02/04/19 0800 02/04/19 0900  BP: (!) 155/62 (!) 160/49 (!) 170/74 (!) 164/55  Pulse: 83 77 87 93  Resp: (!) 25 (!) 22 (!) 25 19  Temp:   98.5 F (36.9 C)   TempSrc:      SpO2: 96% 95% 97% 96%  Weight:      Height:        Intake/Output Summary (Last 24 hours) at 02/04/2019 1148 Last data filed at 02/04/2019 0800 Gross per 24 hour  Intake 1305.04 ml  Output 950 ml  Net 355.04 ml   Filed Weights   01/31/19 0500 02/01/19 0500 02/02/19 0500  Weight: 112.6 kg 110.6 kg 110.5 kg    Examination:  General exam: Comfortable HEENT: Ear/Nose normal on gross exam Respiratory system: Bilateral equal air entry, normal vesicular breath sounds, no wheezes or crackles  Cardiovascular system: S1 & S2 heard, RRR. No JVD, murmurs, rubs, gallops or clicks. No pedal edema. Gastrointestinal system: Abdomen is distended, soft and nontender.Ascites. No organomegaly or masses felt. Normal bowel sounds heard. Central nervous system:Not  Alert and oriented. No focal  neurological deficits. Extremities: No edema, no clubbing ,no cyanosis, distal peripheral pulses palpable. Skin: Mild Icterus,No rashes, lesions or ulcers,no pallor    Data Reviewed: I have personally reviewed following labs and imaging studies  CBC: Recent Labs  Lab 01/28/19 1833 01/31/19 0600 02/01/19 0244 02/02/19 0133 02/03/19 0203 02/04/19 0217  WBC 15.4* 27.4* 19.8* 15.4* 15.7* 12.0*  NEUTROABS 12.2*  --   --   --   --   --   HGB 12.8* 12.3* 11.2* 10.4* 11.3* 11.1*  HCT 36.4* 35.7* 33.0* 30.5* 33.8* 32.8*  MCV 96.0 98.3 99.4 98.1 100.6* 100.3*  PLT 97* 106* 108* 107* 117* [id number]*   Basic Metabolic Panel: Recent Labs  Lab 01/28/19 1833 01/31/19 0600 02/01/19 0244 02/02/19 0133 02/03/19 0203 02/04/19 0217  NA 124* 132* 134* 135 137 141  K 4.7 3.8 3.6 3.6 3.5 3.2*  CL 86* 94* 93* 98 99 105  CO2 24 23 27  27 26 26   GLUCOSE 514* 203* 222* 142* 164* 130*  BUN 29* 32* 31* 29* 35* 31*  CREATININE 1.15 0.97 0.94 0.88 0.71 0.75  CALCIUM 8.3* 7.7* 8.1* 8.0* 7.9* 7.9*  MG 1.5*  --   --  2.0 2.2 2.1   GFR: Estimated Creatinine Clearance: 107.1 mL/min (by C-G formula based on SCr of 0.75 mg/dL). Liver Function Tests: Recent Labs  Lab 01/31/19 0600 02/01/19 0244 02/02/19 0133 02/03/19 0203 02/04/19 0217  AST 63* 48* 37 47* 43*  ALT 37 28 24 25 25   ALKPHOS 304* 267* 228* 218* 216*  BILITOT 12.3* 10.8* 10.5* 10.0* 6.8*  PROT 5.5* 6.2* 5.5* 5.6* 5.5*  ALBUMIN 2.0* 3.3* 2.6* 2.7* 2.5*   Recent Labs  Lab 01/30/19 0332  LIPASE 13   Recent Labs  Lab 01/29/19 1143 02/01/19 1211 02/04/19 0217  AMMONIA 31 35 77*   Coagulation Profile: Recent Labs  Lab 01/31/19 0600 02/01/19 0244 02/02/19 0133 02/03/19 0203 02/04/19 0217  INR 1.3* 1.4* 1.2 1.2 1.2   Cardiac Enzymes: Recent Labs  Lab 01/29/19 0500  CKTOTAL 12*  CKMB 1.6   BNP (last 3 results) No results for input(s): PROBNP in the last 8760 hours. HbA1C: No results for input(s): HGBA1C in the last 72 hours. CBG: Recent Labs  Lab 02/03/19 1257 02/03/19 1557 02/03/19 2011 02/03/19 2317 02/04/19 0801  GLUCAP 171* 150* 147* 125* 127*   Lipid Profile: No results for input(s): CHOL, HDL, LDLCALC, TRIG, CHOLHDL, LDLDIRECT in the last 72 hours. Thyroid Function Tests: No results for input(s): TSH, T4TOTAL, FREET4, T3FREE, THYROIDAB in the last 72 hours. Anemia Panel: Recent Labs    02/03/19 0203  VITAMINB12 2,026*  FOLATE 11.7  TIBC 116*  IRON 45   Sepsis Labs: Recent Labs  Lab 01/29/19 0626 01/31/19 0600 02/01/19 0244  PROCALCITON 3.89 2.76 2.10    Recent Results (from the past 240 hour(s))  Culture, blood (routine x 2)     Status: None   Collection Time: 01/29/19 12:23 AM   Specimen: BLOOD  Result Value Ref Range Status   Specimen Description   Final    BLOOD RIGHT ANTECUBITAL Performed at [facility], [place] [address] [place], [place] [address]    Special Requests   Final    BOTTLES DRAWN AEROBIC AND ANAEROBIC Blood Culture adequate volume Performed at [facility] [address], [place], [place] [address]    Culture   Final    NO GROWTH 5 DAYS Performed at [facility], [place]  [address], [place], [place] [address]    Report Status 02/03/2019 FINAL  Final  Culture, blood (routine x 2)     Status: None (Preliminary result)   Collection Time: 01/30/19  3:32 AM   Specimen: BLOOD  Result Value Ref Range Status   Specimen Description   Final    BLOOD LEFT HAND Performed at [facility] [address], [place], [place] [address]    Special Requests   Final    BOTTLES DRAWN AEROBIC ONLY Blood Culture adequate volume Performed at [facility] [address] [place], [place] [address]    Culture   Final    NO GROWTH 4 DAYS Performed at [facility], [place] [address] [place], [place] [address]    Report Status PENDING  Incomplete  Aerobic/Anaerobic Culture (surgical/deep wound)     Status: None (Preliminary result)   Collection Time: 01/31/19  3:25 PM   Specimen: Abdomen; Peritoneal Fluid  Result Value Ref Range Status   Specimen Description   Final    PERITONEAL Performed at [facility] [address], [place], [place] [address]    Special Requests   Final    NONE Performed at [facility], [place] [address] [place], [place] [address]    Gram Stain   Final    ABUNDANT WBC PRESENT, PREDOMINANTLY PMN NO ORGANISMS SEEN    Culture   Final    NO GROWTH 4 DAYS NO ANAEROBES ISOLATED; CULTURE IN PROGRESS FOR 5 DAYS Performed at [facility] [address] [place], [place] [address]    Report Status PENDING  Incomplete  MRSA PCR Screening     Status: None   Collection Time: 02/01/19  5:18 PM   Specimen: Nasal Mucosa; Nasopharyngeal  Result Value  Ref Range Status   MRSA by PCR NEGATIVE NEGATIVE Final    Comment:        The GeneXpert MRSA Assay (FDA approved for NASAL specimens only), is one component of a comprehensive MRSA colonization surveillance program. It is not intended to diagnose MRSA infection nor to guide or monitor treatment for MRSA infections. Performed at [facility], [place] [address] [place],  [address]          Radiology Studies: No results found.      Scheduled Meds: . amoxicillin-clavulanate  1 tablet Oral Q12H  . Chlorhexidine Gluconate Cloth  6 each Topical Daily  . furosemide  40 mg Oral Daily  . heparin injection (subcutaneous)  5,000 Units Subcutaneous Q8H  . insulin aspart  0-9 Units Subcutaneous Q4H  . insulin glargine  25 Units Subcutaneous Daily  . lactulose  30 g Oral TID  . mouth rinse  15 mL Mouth Rinse BID  . multivitamin with minerals  1 tablet Oral Daily  . pantoprazole  40 mg Oral BID  . potassium chloride  40 mEq Oral Daily  . rifaximin  550 mg Oral BID  . sodium chloride flush  3 mL Intravenous Q12H  . spironolactone  50 mg Oral Daily  . thiamine  100 mg Oral Daily   Or  . thiamine  100 mg Intravenous Daily  . zinc sulfate  220 mg Oral Daily   Continuous Infusions: . sodium chloride 250 mL (01/30/19 1420)  . sodium chloride 500 mL (02/04/19 0410)  . dexmedetomidine (PRECEDEX) IV infusion Stopped (02/04/19 0230)     LOS: 6 days    Time spent: 35 mins.More than  50% of that time was spent in counseling and/or coordination of care.      Shelly Coss, MD [facility] Pager [phone]  If 7PM-7AM, please contact night-coverage [email or web address] Password [facility] 02/04/2019, 11:48 AM

## 2019-02-04 NOTE — Progress Notes (Signed)
On call notified of 10 minute runs of AFIB and irregular heart rhythms throughout evening. IV med order received, awaiting dosage from pharmacy.

## 2019-02-04 NOTE — Progress Notes (Signed)
Physical Therapy Treatment Patient Details Name: Patrick Kidd MRN: [id number] DOB: 07/08/1945 Today's Date: 02/04/2019    History of Present Illness 74 year old Caucasian male with past medical history remarkable for type 2 diabetes mellitus, EtOH cirrhosis, portal hypertension, PUD, BPH, recent Covid-19 infection (01/03/2019)  that was treated with steroids who presented to the ED with right-sided epigastric abdominal pain.  He also reported generalized weakness, fatigue and yellowing to his skin.  He has been drinking vodka, reports roughly sixteen 8 ounce drinks per day. Pt with severe alcohol withdrawal since admission.    PT Comments    Pt requiring increased assist to stand and fatigues very quickly.  Pt only able to ambulate 5 feet with a seated rest break in between.  Continue to recommend SNF upon d/c.  Follow Up Recommendations  SNF;Supervision/Assistance - 24 hour     Equipment Recommendations  None recommended by PT    Recommendations for Other Services       Precautions / Restrictions Precautions Precautions: Fall    Mobility  Bed Mobility               General bed mobility comments: pt received in chair  Transfers Overall transfer level: Needs assistance Equipment used: Rolling walker (2 wheeled) Transfers: Sit to/from Stand Sit to Stand: +2 physical assistance;Mod assist         General transfer comment: assist to rise and stabilize in standing, cues for hand placement  Ambulation/Gait Ambulation/Gait assistance: Min assist Gait Distance (Feet): 5 Feet Assistive device: Rolling walker (2 wheeled) Gait Pattern/deviations: Decreased stride length;Wide base of support;Step-through pattern     General Gait Details: effortful gait and only able to ambulate 2' and then 3' requiring seated rest break due to weakness and fatigue, VSS   Stairs             Wheelchair Mobility    Modified Rankin (Stroke Patients Only)       Balance  Overall balance assessment: Needs assistance Sitting-balance support: No upper extremity supported;Feet supported Sitting balance-Leahy Scale: Fair     Standing balance support: Bilateral upper extremity supported Standing balance-Leahy Scale: Poor Standing balance comment: reliant on UEs                            Cognition Arousal/Alertness: Awake/alert Behavior During Therapy: WFL for tasks assessed/performed Overall Cognitive Status: Impaired/Different from baseline Area of Impairment: Problem solving;Safety/judgement                         Safety/Judgement: Decreased awareness of deficits   Problem Solving: Slow processing        Exercises      General Comments        Pertinent Vitals/Pain Pain Assessment: Faces Faces Pain Scale: Hurts even more Pain Location: R knee Pain Descriptors / Indicators: Aching Pain Intervention(s): Monitored during session;Repositioned    Home Living Family/patient expects to be discharged to:: Private residence Living Arrangements: Spouse/significant other;Children Available Help at Discharge: Family Type of Home: House Home Access: Stairs to enter   Home Layout: One level Home Equipment: Cane - single point;Walker - 2 wheels      Prior Function Level of Independence: Independent with assistive device(s)      Comments: recently amb with cane d/t knee pain   PT Goals (current goals can now be found in the care plan section) Acute Rehab PT Goals Patient Stated Goal: to go  home Progress towards PT goals: Not progressing toward goals - comment(requiring more assist now due to withdrawal)    Frequency    Min 2X/week      PT Plan Current plan remains appropriate    Co-evaluation PT/OT/SLP Co-Evaluation/Treatment: Yes Reason for Co-Treatment: For patient/therapist safety PT goals addressed during session: Mobility/safety with mobility OT goals addressed during session: ADL's and self-care       AM-PAC PT "6 Clicks" Mobility   Outcome Measure  Help needed turning from your back to your side while in a flat bed without using bedrails?: A Little Help needed moving from lying on your back to sitting on the side of a flat bed without using bedrails?: A Lot Help needed moving to and from a bed to a chair (including a wheelchair)?: A Lot Help needed standing up from a chair using your arms (e.g., wheelchair or bedside chair)?: A Lot Help needed to walk in hospital room?: A Little Help needed climbing 3-5 steps with a railing? : Total 6 Click Score: 13    End of Session Equipment Utilized During Treatment: Gait belt Activity Tolerance: Patient limited by fatigue Patient left: in chair;with call bell/phone within reach;with chair alarm set;with nursing/sitter in room Nurse Communication: Mobility status PT Visit Diagnosis: Difficulty in walking, not elsewhere classified (R26.2);Muscle weakness (generalized) (M62.81)     Time: [id number] PT Time Calculation (min) (ACUTE ONLY): 23 min  Charges:  $Gait Training: 8-22 mins                     Arlyce Dice, DPT Acute Rehabilitation Services Office: [phone]  York Ram E 02/04/2019, 12:19 PM

## 2019-02-05 ENCOUNTER — Encounter (HOSPITAL_COMMUNITY): Payer: Self-pay | Admitting: Internal Medicine

## 2019-02-05 ENCOUNTER — Inpatient Hospital Stay (HOSPITAL_COMMUNITY): Payer: Medicare Other

## 2019-02-05 DIAGNOSIS — I456 Pre-excitation syndrome: Secondary | ICD-10-CM

## 2019-02-05 DIAGNOSIS — I10 Essential (primary) hypertension: Secondary | ICD-10-CM

## 2019-02-05 DIAGNOSIS — U071 COVID-19: Secondary | ICD-10-CM

## 2019-02-05 HISTORY — DX: Pre-excitation syndrome: I45.6

## 2019-02-05 LAB — COMPREHENSIVE METABOLIC PANEL
ALT: 30 U/L (ref 0–44)
AST: 53 U/L — ABNORMAL HIGH (ref 15–41)
Albumin: 2.5 g/dL — ABNORMAL LOW (ref 3.5–5.0)
Alkaline Phosphatase: 259 U/L — ABNORMAL HIGH (ref 38–126)
Anion gap: 12 (ref 5–15)
BUN: 27 mg/dL — ABNORMAL HIGH (ref 8–23)
CO2: 25 mmol/L (ref 22–32)
Calcium: 8.3 mg/dL — ABNORMAL LOW (ref 8.9–10.3)
Chloride: 103 mmol/L (ref 98–111)
Creatinine, Ser: 0.82 mg/dL (ref 0.61–1.24)
GFR calc Af Amer: >60 mL/min (ref >60)
GFR calc non Af Amer: >60 mL/min (ref >60)
Glucose, Bld: 192 mg/dL — ABNORMAL HIGH (ref 70–99)
Potassium: 3.8 mmol/L (ref 3.5–5.1)
Sodium: 140 mmol/L (ref 135–145)
Total Bilirubin: 6.4 mg/dL — ABNORMAL HIGH (ref 0.3–1.2)
Total Protein: 6.2 g/dL — ABNORMAL LOW (ref 6.5–8.1)

## 2019-02-05 LAB — AMMONIA: Ammonia: 35 umol/L (ref 9–35)

## 2019-02-05 LAB — GLUCOSE, CAPILLARY
Glucose-Capillary: 228 mg/dL — ABNORMAL HIGH (ref 70–99)
Glucose-Capillary: 184 mg/dL — ABNORMAL HIGH (ref 70–99)
Glucose-Capillary: 196 mg/dL — ABNORMAL HIGH (ref 70–99)
Glucose-Capillary: 193 mg/dL — ABNORMAL HIGH (ref 70–99)
Glucose-Capillary: 254 mg/dL — ABNORMAL HIGH (ref 70–99)
Glucose-Capillary: 310 mg/dL — ABNORMAL HIGH (ref 70–99)

## 2019-02-05 LAB — AEROBIC/ANAEROBIC CULTURE W GRAM STAIN (SURGICAL/DEEP WOUND): Culture: NO GROWTH

## 2019-02-05 MED ORDER — AMLODIPINE BESYLATE 10 MG PO TABS: 10.0000 mg | ORAL_TABLET | Freq: Every day | ORAL | Status: DC

## 2019-02-05 MED ORDER — LACTULOSE 10 GM/15ML PO SOLN
10.0000 g | Freq: Three times a day (TID) | ORAL | Status: DC
  Administered 2019-02-05 – 2019-02-09 (×11): 10 g via ORAL
  Filled 2019-02-05 (×12): qty 15

## 2019-02-05 MED ORDER — LISINOPRIL 10 MG PO TABS
10.0000 mg | ORAL_TABLET | Freq: Every day | ORAL | Status: DC
  Administered 2019-02-06 – 2019-02-09 (×4): 10 mg via ORAL
  Filled 2019-02-05 (×4): qty 1

## 2019-02-05 MED ORDER — LIDOCAINE HCL 1 % IJ SOLN
INTRAMUSCULAR | Status: AC
  Filled 2019-02-05: qty 10

## 2019-02-05 MED ORDER — METOPROLOL TARTRATE 25 MG PO TABS
25.0000 mg | ORAL_TABLET | Freq: Two times a day (BID) | ORAL | Status: DC
  Filled 2019-02-05: qty 1

## 2019-02-05 MED ORDER — FUROSEMIDE 20 MG PO TABS
20.0000 mg | ORAL_TABLET | Freq: Every day | ORAL | Status: DC
  Administered 2019-02-06 – 2019-02-09 (×4): 20 mg via ORAL
  Filled 2019-02-05 (×4): qty 1

## 2019-02-05 MED ORDER — ALUM & MAG HYDROXIDE-SIMETH 200-200-20 MG/5ML PO SUSP
30.0000 mL | Freq: Four times a day (QID) | ORAL | Status: DC | PRN
  Administered 2019-02-05 – 2019-02-07 (×2): 30 mL via ORAL
  Filled 2019-02-05 (×2): qty 30

## 2019-02-05 NOTE — Progress Notes (Signed)
IR requested by Dr. Tawanna Solo for possible image-guided paracentesis.  Limited abdominal US revealed no fluid that could be safely accessed with procedure today. Images sent to Dr. Pascal Lux for review. Informed patient that procedure will not occur today. All questions answered and concerns addressed. Will make Dr. Tawanna Solo aware.  IR available in future if needed.   Bea Graff Rexine Gowens, PA-C 02/05/2019, 2:14 PM

## 2019-02-05 NOTE — Progress Notes (Addendum)
[facility] Progress Note  CC:     Decompensated cirrhosis, jaundice, hepatic encephalopathy    Subjective: He is sitting up in bed. He denies having any N/V or abdominal pain. He is passing about 400cc golden brown loose stool in the fecal bag.    Objective:  Vital signs in last 24 hours: Temp:  [98.1 F (36.7 C)-99.2 F (37.3 C)] 98.4 F (36.9 C) (01/02 1439) Pulse Rate:  [87-121] 89 (01/02 1439) Resp:  [12-26] 18 (01/02 1439) BP: (128-187)/(31-91) 157/69 (01/02 1439) SpO2:  [93 %-98 %] 98 % (01/02 1439) Last BM Date: 02/05/19 General:   Alert in NAD. Eyes: Sclera with less icteric  today.  Heart: RRR, no murmur Pulm: Lungs clear.  Abdomen: Protuberant, distended but not tense, some ascites present, nontender, umbilical hernia, hypoactive BS x 4 quads, difficult to assess for HSM. Extremities:  Without edema. Neurologic:  Alert and  oriented x4;  grossly normal neurologically. Speech is clear. He is answering questions appropriately. No asterixis.  Psych:  Alert and cooperative. Normal mood and affect.  Intake/Output from previous day: 01/01 0701 - 01/02 0700 In: 240 [P.O.:240] Out: 1000 [Stool:1000] Intake/Output this shift: Total I/O In: 240 [P.O.:240] Out: -   Lab Results: Recent Labs    02/03/19 0203 02/04/19 0217  WBC 15.7* 12.0*  HGB 11.3* 11.1*  HCT 33.8* 32.8*  PLT 117* 122*   BMET Recent Labs    02/04/19 0217 02/04/19 2222 02/05/19 0151  NA 141 140 140  K 3.2* 3.6 3.8  CL 105 103 103  CO2 _0 GLUCOSE 130* 232* 192*  BUN 31* 29* 27*  CREATININE 0.75 0.97 0.82  CALCIUM 7.9* 8.2* 8.3*   LFT Recent Labs    02/04/19 0217 02/05/19 0151  PROT 5.5* 6.2*  ALBUMIN 2.5* 2.5*  AST 43* 53*  ALT 25 30  ALKPHOS 216* 259*  BILITOT 6.8* 6.4*  BILIDIR 3.5*  --   IBILI 3.3*  --    PT/INR Recent Labs    02/03/19 0203 02/04/19 0217  LABPROT 14.9 14.8  INR 1.2 1.2   Hepatitis Panel No results for input(s): HEPBSAG,  HCVAB, HEPAIGM, HEPBIGM in the last 72 hours.  [place] ASCITES (ABDOMEN LIMITED)  Result Date: 02/05/2019 CLINICAL DATA:  Ascites. EXAM: LIMITED ABDOMEN ULTRASOUND FOR ASCITES TECHNIQUE: Limited ultrasound survey for ascites was performed in all four abdominal quadrants. COMPARISON:  January 31, 2019. FINDINGS: Minimal amount of fluid is noted in the left lower quadrant. No other ascites is noted. IMPRESSION: Only minimal ascites is noted. Adequate fluid pocket for paracentesis was not identified. Electronically Signed   By: Marijo Conception M.D.   On: 02/05/2019 14:21    Assessment / Plan:  71. 74 year old male with alcoholic cirrhosis, jaundice, RUQ pain and leukocytosis.A diagnostic paracentesis (1108m peritoneal fluid was removed)12/28 was positive for SBP with a PMN count greater than 45,000. Peritoneal fluid cx no growth. On Unasyn IVday # 4. He receivedAlbumin100gm IV x1 postparacentesis.MRCP/MRI 12/28 showed asymmetric gallbladder wall thickening, no evidence of choledocholithiasis. Alk phos and LFTs slightly increased today.  Alk phos 259. AST 53. ALT 30. T. Bili 6.4.  WBC 12.1 on 1/1. He is afebrile.  -repeat diagnostic deferred for now as recommended by Dr. NSilverio Decamp-Continue Unasyn IV -Start Augmentin 8749mpo bid x 10 days when discharged home then Cipro 50091m po QD -Lasix 82m77m QD and Aldosterone 50mg73mQD -GI follow up in 2 to 4 weeks once discharged  home -Eventual surveillance EGD for Barrett's esophagus and esophageal varices as outpatient   2. Hepatic encephalopathy significantly improved. Ammonia 35. -Continue Lactulose and Xifaxan -no need to recheck ammonia level  3. Normocytic anemia.  Repeat iron levels were normal.  No evidence of active GI bleeding. Labs 1/1  Hemoglobin 11.1.  Hematocrit 32.8.  MCV 100.3.  Iron 45.  TIBC 116.  Vitamin B12 2026.  4. Thrombocytopenia secondary to cirrhosis.  Platelet 122.  5. Coagulopathy secondary to cirrhosis, stable.  INR  1.2.  6.  History of colon polyps -Patient to have surveillance colonoscopy at the time of his surveillance EGD as outpatient  7.  Hypokalemia, resolved  8. DM II    Principal Problem:   Acute cholecystitis Active Problems:   GERD   Type 2 diabetes mellitus with complication, with long-term current use of insulin (HCC)   Essential hypertension   Transaminitis   COVID-19 virus infection   Ascites   Jaundice   Leucocytosis   SBP (spontaneous bacterial peritonitis) (HCC)   Decompensated hepatic cirrhosis (HCC)   WPW (Wolff-Parkinson-White syndrome)     LOS: 7 days   Patrick Kidd  02/05/2019, 2:59 PM    Attending physician's note   I have taken an interval history, reviewed the chart and examined the patient. I agree with the Advanced Practitioner's note, impression and recommendations.   Damaris Hippo , MD [phone]

## 2019-02-05 NOTE — Progress Notes (Signed)
PROGRESS NOTE    Patrick Kidd  [id number] DOB: 1945-08-18 DOA: 01/28/2019 PCP: [name], MD   Brief Narrative:  Patient is a 74 year old male with history of type 2 diabetes mellitus, alcoholic cirrhosis, portal hypertension, PVD, BPH, recent Covid-19  infection presented to the emergency department with right-sided epigastric abdominal pain.  He also reported generalized weakness, fatigue, jaundice.  He drinks about sixteen 8 ounce drinks vodka a day.  In the emergency department, he was found to have elevated liver enzymes.  CT abdomen/pelvis showed mild distended gallbladder with surrounding inflammatory changes, small pericholecystic fluid suggesting  possible cholecystitis.  General surgery was consulted and he is not a candidate for any kind of surgical procedure.  GI and PCCM were also following him.   Underwent paracentesis and fluid analysis was suggestive of SBP.  Started on antibiotics. Mental status has significantly improved. Off precedex. Oral diet has been started.  Physical therapy /OT recommended skilled nursing  facility.   1/2: There was concern for SVT/A. fib with RVR last night.  He actually has history of WPW syndrome.  I requested cardiology consult.  Cardiology concluded that he does not have A. fib.  EKG showed frequent PACs.  Recommended to avoid beta-blockers, calcium.  Stable for transfer to the telemetry.  Assessment & Plan:   Principal Problem:   Acute cholecystitis Active Problems:   GERD   Type 2 diabetes mellitus with complication, with long-term current use of insulin (HCC)   Essential hypertension   Transaminitis   COVID-19 virus infection   Ascites   Jaundice   Leucocytosis   SBP (spontaneous bacterial peritonitis) (HCC)   Decompensated hepatic cirrhosis (HCC)   WPW (Wolff-Parkinson-White syndrome)   Abdominal pain secondary to SBP/possible cholecystitis: CT abdomen/pelvis showed possible acute cholecystitis.  Right upper quadrant  ultrasound showed gallstones, mild gallbladder wall thickening.general surgery was consulted and due to his child's class C cirrhosis he is not a candidate for surgical intervention.  General surgery thought that this is less likely a cholecystitis. Underwent diagnostic paracentesis by IR and fluid analysis showed SBP.  MRCP did not show any bile duct dilation, no choledocholithiasis, showed asymmetric gallbladder thickening, cirrhosis. Peritoneal fluid culture has not shown any growth.  Blood cultures negative so far.  He was on Unasyn,changed to Augmentin which will be continued for 10 days and then he will be on ciprofloxacin 5 mg daily for SBP prophylaxis. He does not complain of any abdominal pain today.  Decompensated alcohol cirrhosis/portal hypertension: CT abdomen showed hepatomegaly, cirrhosis, perihepatic ascites.  MELD score  of 28 on admission.  Mild elevated liver enzymes.  GI following.  On lactulose 3 times a day.  Also on rifaximin, and zinc.  His abdomen remiansdistended today, I have requested for US paracentesis.  Started on Lasix and spironolactone.  GI following. Ammonia level  Better..  WPW syndrome:There was concern for SVT/A. fib with RVR last night.  He actually has history of WPW syndrome.  I requested cardiology consult.  Cardiology concluded that he does not have A. fib.  EKG showed frequent PACs.  Recommended to avoid beta-blockers, calcium.  Stable for transfer to the telemetry.  Cardiology will arrange outpatient EP follow-up.  Alcohol abuse disorder with withdrawals: On 12/28, he became more confused, agitated, tachycardic exhibiting signs of alcohol withdrawal symptoms.  PCCM was following.  He was started on Precedex.  Off Precedex now.  Continue thiamine, folic acid  GERD: Continue PPI  Type 2 diabetes mellitus: Hemoglobin A1c of  8.9 as per 12/20.  On Metformin as an outpatient.  Continue Lantus and sliding scale.  Hypertension: Remains hypertensive today.  Started  on lisinopril  Hyponatremia: Much improved  Recent Covid-19 infection: Diagnosed on 01/03/2019, treated with steroids.  Currently respiratory status stable.  Not on airborne or contact isolation.  Weakness/debility: PT/OT recommended skilled nursing facility.  Morbid obesity: BMI of 31.2.     Nutrition Problem: Increased nutrient needs Etiology: acute illness      DVT prophylaxis: Heparin [place] Code Status: Full Family Communication: Called wife on phone for updates on 02/04/18 Disposition Plan: SNF . Waiting for social worker evaluation.  He should be ready in the next 24 to 48 hours for discharge.   Consultants: None  Procedures: None  Antimicrobials:  Anti-infectives (From admission, onward)   Start     Dose/Rate Route Frequency Ordered Stop   02/04/19 1000  amoxicillin-clavulanate (AUGMENTIN) 875-125 MG per tablet 1 tablet     1 tablet Oral Every 12 hours 02/04/19 0858     02/02/19 1000  rifaximin (XIFAXAN) tablet 550 mg     550 mg Oral 2 times daily 02/02/19 0931     02/01/19 2200  rifaximin (XIFAXAN) tablet 200 mg  Status:  Discontinued     200 mg Oral 2 times daily 02/01/19 1550 02/02/19 0931   02/01/19 1800  Ampicillin-Sulbactam (UNASYN) 3 g in sodium chloride 0.9 % 100 mL IVPB  Status:  Discontinued     3 g 200 mL/hr over 30 Minutes Intravenous Every 6 hours 02/01/19 1159 02/04/19 0858   02/01/19 1600  metroNIDAZOLE (FLAGYL) IVPB 500 mg  Status:  Discontinued     500 mg 100 mL/hr over 60 Minutes Intravenous Every 8 hours 02/01/19 1550 02/01/19 1610   02/01/19 1000  amoxicillin-clavulanate (AUGMENTIN) 875-125 MG per tablet 1 tablet  Status:  Discontinued     1 tablet Oral Every 12 hours 02/01/19 0535 02/01/19 0930   02/01/19 1000  piperacillin-tazobactam (ZOSYN) IVPB 3.375 g  Status:  Discontinued     3.375 g 12.5 mL/hr over 240 Minutes Intravenous Every 8 hours 02/01/19 0935 02/01/19 1159   02/01/19 0530  cefTRIAXone (ROCEPHIN) 2 g in sodium chloride 0.9 % 100 mL  IVPB  Status:  Discontinued     2 g 200 mL/hr over 30 Minutes Intravenous Every 24 hours 02/01/19 0532 02/01/19 0533   01/31/19 2200  amoxicillin-clavulanate (AUGMENTIN) 875-125 MG per tablet 1 tablet  Status:  Discontinued     1 tablet Oral Every 12 hours 01/31/19 1753 02/01/19 0532   01/29/19 0700  piperacillin-tazobactam (ZOSYN) IVPB 3.375 g  Status:  Discontinued     3.375 g 12.5 mL/hr over 240 Minutes Intravenous Every 8 hours 01/29/19 0403 01/31/19 1753   01/29/19 0030  piperacillin-tazobactam (ZOSYN) IVPB 3.375 g     3.375 g 100 mL/hr over 30 Minutes Intravenous  Once 01/29/19 0019 01/29/19 0123      Subjective:  Patient seen and examined the bedside this morning.  Hemodynamically stable.  During my evaluation his heart rate was ranging around 90-100.  Denies any complaints.  Denies any abdominal pain.  Objective: Vitals:   02/05/19 0900 02/05/19 1000 02/05/19 1100 02/05/19 1200  BP: (!) 167/58 (!) 156/66 (!) 160/62 (!) 175/65  Pulse: (!) 114 89 87 95  Resp: (!) 26 (!) 23 (!) 22 (!) 23  Temp:    98.6 F (37 C)  TempSrc:    Oral  SpO2: 95% 97% 95% 95%  Weight:  Height:        Intake/Output Summary (Last 24 hours) at 02/05/2019 1318 Last data filed at 02/05/2019 0400 Gross per 24 hour  Intake --  Output 1000 ml  Net -1000 ml   Filed Weights   01/31/19 0500 02/01/19 0500 02/02/19 0500  Weight: 112.6 kg 110.6 kg 110.5 kg    Examination:  General exam: Comfortable HEENT: Ear/Nose normal on gross exam Respiratory system: Bilateral equal air entry, normal vesicular breath sounds, no wheezes or crackles  Cardiovascular system: Regular rhythm. No JVD, murmurs, rubs, gallops or clicks. No pedal edema. Gastrointestinal system: Abdomen is distended, soft and nontender.Ascites. No organomegaly or masses felt. Normal bowel sounds heard. Central nervous system:Not  Alert and oriented. No focal neurological deficits. Extremities: No edema, no clubbing ,no cyanosis, distal  peripheral pulses palpable. Skin: Mild Icterus,No rashes, lesions or ulcers,no pallor    Data Reviewed: I have personally reviewed following labs and imaging studies  CBC: Recent Labs  Lab 01/31/19 0600 02/01/19 0244 02/02/19 0133 02/03/19 0203 02/04/19 0217  WBC 27.4* 19.8* 15.4* 15.7* 12.0*  HGB 12.3* 11.2* 10.4* 11.3* 11.1*  HCT 35.7* 33.0* 30.5* 33.8* 32.8*  MCV 98.3 99.4 98.1 100.6* 100.3*  PLT 106* 108* 107* 117* 123XX123*   Basic Metabolic Panel: Recent Labs  Lab 02/02/19 0133 02/03/19 0203 02/04/19 0217 02/04/19 2222 02/05/19 0151  NA 135 137 141 140 140  K 3.6 3.5 3.2* 3.6 3.8  CL 98 99 105 103 103  CO2 27 26 26 26 25   GLUCOSE 142* 164* 130* 232* 192*  BUN 29* 35* 31* 29* 27*  CREATININE 0.88 0.71 0.75 0.97 0.82  CALCIUM 8.0* 7.9* 7.9* 8.2* 8.3*  MG 2.0 2.2 2.1 2.1  --    GFR: Estimated Creatinine Clearance: 104.5 mL/min (by C-G formula based on SCr of 0.82 mg/dL). Liver Function Tests: Recent Labs  Lab 02/01/19 0244 02/02/19 0133 02/03/19 0203 02/04/19 0217 02/05/19 0151  AST 48* 37 47* 43* 53*  ALT 28 24 25 25 30   ALKPHOS 267* 228* 218* 216* 259*  BILITOT 10.8* 10.5* 10.0* 6.8* 6.4*  PROT 6.2* 5.5* 5.6* 5.5* 6.2*  ALBUMIN 3.3* 2.6* 2.7* 2.5* 2.5*   Recent Labs  Lab 01/30/19 0332  LIPASE 13   Recent Labs  Lab 02/01/19 1211 02/04/19 0217 02/05/19 0151  AMMONIA 35 77* 35   Coagulation Profile: Recent Labs  Lab 01/31/19 0600 02/01/19 0244 02/02/19 0133 02/03/19 0203 02/04/19 0217  INR 1.3* 1.4* 1.2 1.2 1.2   Cardiac Enzymes: No results for input(s): CKTOTAL, CKMB, CKMBINDEX, TROPONINI in the last 168 hours. BNP (last 3 results) No results for input(s): PROBNP in the last 8760 hours. HbA1C: No results for input(s): HGBA1C in the last 72 hours. CBG: Recent Labs  Lab 02/04/19 1544 02/04/19 2328 02/05/19 0356 02/05/19 0727 02/05/19 1228  GLUCAP 223* 198* 184* 196* 193*   Lipid Profile: No results for input(s): CHOL, HDL,  LDLCALC, TRIG, CHOLHDL, LDLDIRECT in the last 72 hours. Thyroid Function Tests: No results for input(s): TSH, T4TOTAL, FREET4, T3FREE, THYROIDAB in the last 72 hours. Anemia Panel: Recent Labs    02/03/19 0203  VITAMINB12 2,026*  FOLATE 11.7  TIBC 116*  IRON 45   Sepsis Labs: Recent Labs  Lab 01/31/19 0600 02/01/19 0244  PROCALCITON 2.76 2.10    Recent Results (from the past 240 hour(s))  Culture, blood (routine x 2)     Status: None   Collection Time: 01/29/19 12:23 AM   Specimen: BLOOD  Result Value  Ref Range Status   Specimen Description   Final    BLOOD RIGHT ANTECUBITAL Performed at [facility] [address] [place], [place] [address]    Special Requests   Final    BOTTLES DRAWN AEROBIC AND ANAEROBIC Blood Culture adequate volume Performed at [facility] [address] [place], [place] [address]    Culture   Final    NO GROWTH 5 DAYS Performed at [facility], [place] [address], [place], [place] [address]    Report Status 02/03/2019 FINAL  Final  Culture, blood (routine x 2)     Status: None   Collection Time: 01/30/19  3:32 AM   Specimen: BLOOD  Result Value Ref Range Status   Specimen Description   Final    BLOOD LEFT HAND Performed at [facility] [address] [place], [place] [address]    Special Requests   Final    BOTTLES DRAWN AEROBIC ONLY Blood Culture adequate volume Performed at [facility] [address] [place], [place] [address]    Culture   Final    NO GROWTH 5 DAYS Performed at [facility], [place] [address], [place], [place] [address]    Report Status 02/04/2019 FINAL  Final  Aerobic/Anaerobic Culture (surgical/deep wound)     Status: None   Collection Time: 01/31/19  3:25 PM   Specimen: Abdomen; Peritoneal Fluid  Result Value Ref Range Status   Specimen Description   Final    PERITONEAL Performed at [facility]  [address], [place], [place] [address]    Special Requests   Final    NONE Performed at [facility], [place] [address] [place], [place] [address]    Gram Stain   Final    ABUNDANT WBC PRESENT, PREDOMINANTLY PMN NO ORGANISMS SEEN    Culture   Final    No growth aerobically or anaerobically. Performed at [facility], [place] [address], [place], [place] [address]    Report Status 02/05/2019 FINAL  Final  MRSA PCR Screening     Status: None   Collection Time: 02/01/19  5:18 PM   Specimen: Nasal Mucosa; Nasopharyngeal  Result Value Ref Range Status   MRSA by PCR NEGATIVE NEGATIVE Final    Comment:        The GeneXpert MRSA Assay (FDA approved for NASAL specimens only), is one component of a comprehensive MRSA colonization surveillance program. It is not intended to diagnose MRSA infection nor to guide or monitor treatment for MRSA infections. Performed at [facility], [place] [address], [place], [place] [address]          Radiology Studies: No results found.      Scheduled Meds: . amoxicillin-clavulanate  1 tablet Oral Q12H  . Chlorhexidine Gluconate Cloth  6 each Topical Daily  . furosemide  40 mg Oral Daily  . heparin injection (subcutaneous)  5,000 Units Subcutaneous Q8H  . insulin aspart  0-9 Units Subcutaneous Q4H  . insulin glargine  25 Units Subcutaneous Daily  . lactulose  30 g Oral TID  . lisinopril  10 mg Oral Daily  . mouth rinse  15 mL Mouth Rinse BID  . multivitamin with minerals  1 tablet Oral Daily  . pantoprazole  40 mg Oral BID  . potassium chloride  40 mEq Oral Daily  . rifaximin  550 mg Oral BID  . sodium chloride flush  3  mL Intravenous Q12H  . spironolactone  50 mg Oral Daily  . thiamine  100 mg Oral Daily   Or  . thiamine  100 mg Intravenous Daily  . zinc sulfate  220 mg Oral Daily   Continuous Infusions: . sodium chloride 250 mL (01/30/19 1420)  . sodium chloride 500 mL (02/04/19 0410)  .  dexmedetomidine (PRECEDEX) IV infusion Stopped (02/04/19 0230)     LOS: 7 days    Time spent: 35 mins.More than 50% of that time was spent in counseling and/or coordination of care.      Shelly Coss, MD [facility] Pager [phone]  If 7PM-7AM, please contact night-coverage [email or web address] Password [email or web address] 02/05/2019, 1:18 PM

## 2019-02-05 NOTE — Consult Note (Signed)
Cardiology Consultation:   Patient ID: Patrick Kidd MRN: [id number]; DOB: Jul 07, 1945  Admit date: 01/28/2019 Date of Consult: 02/05/2019  Primary Care Provider: [name], MD Primary Cardiologist: Quay Burow, MD  Primary Electrophysiologist:  None    Patient Profile:   Patrick Kidd is a 74 y.o. male with a hx of alcoholic cirrhosis, portal hypertension, DM, hypertension, PVD, WPW, and recent COVID-19 infection who is being seen today for the evaluation of new onset atrial fibrillation at the request of Dr. Tawanna Solo.  History of Present Illness:   Patrick Kidd was admitted 12/25 with decompensated cirrhosis, hepatic encephalopathy and ascites.  He was initially thought to have acute cholecystitis with gallstones and mild gallbladder wall thickening.  However given his cirrhosis he was not thought to be a surgical candidate.  He was subsequently found to have spontaneous bacterial peritonitis.  MRCP did not show any ductal dilatation or choledocholithiasis.  He continues on antibiotics per internal medicine.  His hospitalization has been complicated by hepatic encephalopathy and alcohol withdrawal.  He previously required Precedex which has been subsequently discontinued.  Cardiology was consulted for new onset atrial fibrillation with RVR.  Telemetry was concerning for atrial fibrillation with rates in the 100s-110s.  He denies chest pain or shortness of breath.  He also denies palpitations, lightheadedness or dizziness.  He received one dose of IV diltiazem 1/1 and scheduled to start metoprolol 1/2.  Patrick Kidd was treated for COVID-19 01/03/2019.  He was successfully treated with steroids as an outpatient.  At baseline he drinks at least 16 oz of vodka daily.  Of note, he reports a history of WPW noted in childhood.  He reports that his son has this condition as well.  He never had an ablation.    Heart Pathway Score:     Past Medical History:  Diagnosis Date  .  Acrophobia   . Alcoholic cirrhosis ([place]) 5/40/0867  . Anemia   . Arthritis    foot by big toe  . BPH associated with nocturia   . Cataract    removed both eyes  . Chronic cough    PMH of  . Diabetes mellitus without complication ([place])   . Diverticulosis 07-03-2010   Colonoscopy.   . Duodenal ulcer 2017  . Fallen arches    Bilateral  . GERD (gastroesophageal reflux disease)   . Gout   . Granuloma annulare   . Hx of adenomatous colonic polyps multiple  . Hydrocele 2011   Large septated right hydrocele  . Liver cyst   . Liver lesion   . Nonspecific elevation of levels of transaminase or lactic acid dehydrogenase (LDH)   . Obesity   . Peripheral neuropathy   . Plantar fasciitis    PMH of  . Portal hypertension ([place]) 2017  . Prostate cancer ([place])   . Right shoulder pain 11/2017  . Sleep apnea    no cpap, patient denies  . Wears reading eyeglasses     Past Surgical History:  Procedure Laterality Date  . CATARACT EXTRACTION, BILATERAL  12/2011    Dr Gershon Crane  . COLONOSCOPY  2017  . COLONOSCOPY W/ POLYPECTOMY  07/03/2010   2 adenomas, diverticulosis on right. Dr [name]  . CYSTOSCOPY N/A 03/15/2018   Procedure: CYSTOSCOPY FLEXIBLE;  Surgeon: [name], MD;  Location: [facility];  Service: Urology;  Laterality: N/A;  NO SEEDS FOUND IN BLADDER  . ESOPHAGOGASTRODUODENOSCOPY (EGD) WITH PROPOFOL N/A 01/08/2016   Procedure: ESOPHAGOGASTRODUODENOSCOPY (EGD) WITH  PROPOFOL;  Surgeon: [name], MD;  Location: [facility];  Service: Endoscopy;  Laterality: N/A;  . FLEXIBLE SIGMOIDOSCOPY  2000  . RADIOACTIVE SEED IMPLANT N/A 03/15/2018   Procedure: RADIOACTIVE SEED IMPLANT/BRACHYTHERAPY IMPLANT;  Surgeon: [name], MD;  Location: [facility];  Service: Urology;  Laterality: N/A;   72 SEEDS IMPLANTED  . SIGMOIDOSCOPY    . SPACE OAR INSTILLATION N/A 03/15/2018   Procedure: SPACE OAR INSTILLATION;  Surgeon: [name], MD;   Location: [facility];  Service: Urology;  Laterality: N/A;  . WISDOM TOOTH EXTRACTION       Home Medications:  Prior to Admission medications   Medication Sig Start Date End Date Taking? Authorizing Provider  cyclobenzaprine (FLEXERIL) 5 MG tablet Take 1 to 2 tabs at bedtime if needed. Patient taking differently: Take 5 mg by mouth 3 (three) times daily as needed for muscle spasms.  01/10/19  Yes [name], MD  gabapentin (NEURONTIN) 300 MG capsule Take 1 capsule (300 mg total) by mouth 2 (two) times daily. Patient taking differently: Take 300 mg by mouth daily.  08/24/18  Yes Jaffe, Adam R, DO  ibuprofen (ADVIL) 200 MG tablet Take 400 mg by mouth every 6 (six) hours as needed for moderate pain.   Yes [provider]  metFORMIN (GLUCOPHAGE-XR) 500 MG 24 hr tablet TAKE 1 TABLET(500 MG) BY MOUTH DAILY WITH BREAKFAST Patient taking differently: Take 500 mg by mouth daily with breakfast.  04/20/18  Yes [name], MD  pantoprazole (PROTONIX) 40 MG tablet TAKE 1 TABLET(40 MG) BY MOUTH TWICE DAILY Patient taking differently: Take 40 mg by mouth 2 (two) times daily.  03/17/18  Yes Gatha Mayer, MD  blood glucose meter kit and supplies KIT Use to test blood sugar once daily. DX E11.8 12/21/17   [name], MD  Blood Glucose Monitoring Suppl (FREESTYLE FREEDOM LITE) w/Device KIT Use to check blood sugars 2 times daily 01/14/16   [name], FNP  glucose blood (COOL BLOOD GLUCOSE TEST STRIPS) test strip Use to check blood sugar daily. DX: E11.8 12/21/17   [name], MD  Lancets (ACCU-CHEK MULTICLIX) lancets Use to check sugar daily. DX: E11.8 12/21/17   [name], MD  mirabegron ER (MYRBETRIQ) 25 MG TB24 tablet Take 1 tablet (25 mg total) by mouth daily. Patient not taking: Reported on 01/28/2019 01/10/19   [name], MD    Inpatient Medications: Scheduled Meds: . amoxicillin-clavulanate  1 tablet Oral Q12H  . Chlorhexidine Gluconate  Cloth  6 each Topical Daily  . furosemide  40 mg Oral Daily  . heparin injection (subcutaneous)  5,000 Units Subcutaneous Q8H  . insulin aspart  0-9 Units Subcutaneous Q4H  . insulin glargine  25 Units Subcutaneous Daily  . lactulose  30 g Oral TID  . mouth rinse  15 mL Mouth Rinse BID  . multivitamin with minerals  1 tablet Oral Daily  . pantoprazole  40 mg Oral BID  . potassium chloride  40 mEq Oral Daily  . rifaximin  550 mg Oral BID  . sodium chloride flush  3 mL Intravenous Q12H  . spironolactone  50 mg Oral Daily  . thiamine  100 mg Oral Daily   Or  . thiamine  100 mg Intravenous Daily  . zinc sulfate  220 mg Oral Daily   Continuous Infusions: . sodium chloride 250 mL (01/30/19 1420)  . sodium chloride 500 mL (02/04/19  0410)  . dexmedetomidine (PRECEDEX) IV infusion Stopped (02/04/19 0230)   PRN Meds: sodium chloride, sodium chloride, benzonatate, diltiazem, hydrALAZINE, morphine injection, sodium chloride flush  Allergies:   No Known Allergies  Social History:   Social History   Socioeconomic History  . Marital status: Married    Spouse name: [name]  . Number of children: 2  . Years of education: Not on file  . Highest education level: Some college, no degree  Occupational History  . Occupation: Adult nurse estate  Tobacco Use  . Smoking status: Former Smoker    Packs/day: 2.00    Years: 6.00    Pack years: 12.00    Types: Cigarettes    Quit date: 02/04/1971    Years since quitting: 48.0  . Smokeless tobacco: Never Used  . Tobacco comment: smoked age [age], up to 2 ppd  Substance and Sexual Activity  . Alcohol use: Yes    Alcohol/week: 28.0 standard drinks    Types: 28 Shots of liquor per week    Comment: 2-3 vodka martinins a day  . Drug use: No  . Sexual activity: Yes    Partners: Female  Other Topics Concern  . Not on file  Social History Narrative   Fun/Hobby: Play golf, grandchildren, [employer]      Patient is left-handed. He lives with his wife  in a one level home. He swims most days.   Social Determinants of Health   Financial Resource Strain:   . Difficulty of Paying Living Expenses: Not on file  Food Insecurity:   . Worried About Charity fundraiser in the Last Year: Not on file  . Ran Out of Food in the Last Year: Not on file  Transportation Needs:   . Lack of Transportation (Medical): Not on file  . Lack of Transportation (Non-Medical): Not on file  Physical Activity:   . Days of Exercise per Week: Not on file  . Minutes of Exercise per Session: Not on file  Stress:   . Feeling of Stress : Not on file  Social Connections:   . Frequency of Communication with Friends and Family: Not on file  . Frequency of Social Gatherings with Friends and Family: Not on file  . Attends Religious Services: Not on file  . Active Member of Clubs or Organizations: Not on file  . Attends Archivist Meetings: Not on file  . Marital Status: Not on file  Intimate Partner Violence:   . Fear of Current or Ex-Partner: Not on file  . Emotionally Abused: Not on file  . Physically Abused: Not on file  . Sexually Abused: Not on file    Family History:    Family History  Problem Relation Age of Onset  . Lung cancer Mother        smoker  . Leukemia Father        Acute myelocytic  . Diabetes Neg Hx   . Stroke Neg Hx   . Heart disease Neg Hx   . Colon cancer Neg Hx   . Colon polyps Neg Hx   . Esophageal cancer Neg Hx   . Rectal cancer Neg Hx   . Stomach cancer Neg Hx      ROS:  Please see the history of present illness.   All other ROS reviewed and negative.     Physical Exam/Data:   Vitals:   02/05/19 0700 02/05/19 0800 02/05/19 0900 02/05/19 1000  BP: (!) 128/41 (!) 179/59 (!) 167/58 (!) 156/66  Pulse: (!) 101 92 (!) 114 89  Resp: 20 (!) 23 (!) 26 (!) 23  Temp:  98.3 F (36.8 C)    TempSrc:  Oral    SpO2: 95% 95% 95% 97%  Weight:      Height:        Intake/Output Summary (Last 24 hours) at 02/05/2019  1013 Last data filed at 02/05/2019 0400 Gross per 24 hour  Intake 120 ml  Output 1000 ml  Net -880 ml   Last 3 Weights 02/02/2019 02/01/2019 01/31/2019  Weight (lbs) 243 lb 9.7 oz 243 lb 13.3 oz 248 lb 3.8 oz  Weight (kg) 110.5 kg 110.6 kg 112.6 kg     VS:  BP (!) 156/66   Pulse 89   Temp 98.3 F (36.8 C) (Oral)   Resp (!) 23   Ht _0  (1.88 m)   Wt 110.5 kg   SpO2 97%   BMI 31.28 kg/m  , BMI Body mass index is 31.28 kg/m. GENERAL:  Chronically ill-appearing.  Weak. HEENT: Pupils equal round and reactive, fundi not visualized, oral mucosa unremarkable NECK:  No jugular venous distention, waveform within normal limits, carotid upstroke brisk and symmetric, no bruits LUNGS:  Clear to auscultation bilaterally HEART:  Irregularly irregular.  Tachycardic.  PMI not displaced or sustained,S1 and S2 within normal limits, no S3, no S4, no clicks, no rubs, no murmurs ABD:  Flat, positive bowel sounds normal in frequency in pitch, no bruits, no rebound, no guarding, no midline pulsatile mass, no hepatomegaly, no splenomegaly EXT:  2 plus pulses throughout, no edema, no cyanosis no clubbing SKIN:  No rashes no nodules NEURO:  Cranial nerves II through XII grossly intact, motor grossly intact throughout PSYCH:  Cognitively intact, oriented to person place and time   EKG:  The EKG was personally reviewed and demonstrates:  Sinus tachycardia.  Frequent PACs.  Short PR interval.  Telemetry:  Telemetry was personally reviewed and demonstrates:  Sinus tachycardia.  Short PR.  Frequent PACs.  Relevant CV Studies:  Echo 01/29/19: IMPRESSIONS    1. Left ventricular ejection fraction, by visual estimation, is 60 to 65%. The left ventricle has normal function. There is no left ventricular hypertrophy.  2. Definity contrast agent was given IV to delineate the left ventricular endocardial borders.  3. Left ventricular diastolic parameters are consistent with Grade I diastolic dysfunction  (impaired relaxation).  4. The left ventricle has no regional wall motion abnormalities.  5. Global right ventricle has normal systolic function.The right ventricular size is normal. No increase in right ventricular wall thickness.  6. Left atrial size was normal.  7. Right atrial size was normal.  8. The mitral valve is normal in structure. No evidence of mitral valve regurgitation. No evidence of mitral stenosis.  9. The tricuspid valve is normal in structure. 10. The aortic valve is normal in structure. Aortic valve regurgitation is not visualized. No evidence of aortic valve sclerosis or stenosis. 11. The pulmonic valve was normal in structure. Pulmonic valve regurgitation is not visualized. 12. The inferior vena cava is normal in size with greater than 50% respiratory variability, suggesting right atrial pressure of 3 mmHg.  Laboratory Data:  High Sensitivity Troponin:   Recent Labs  Lab 01/29/19 0500 01/29/19 0626  TROPONINIHS 18* 17     Chemistry Recent Labs  Lab 02/04/19 0217 02/04/19 2222 02/05/19 0151  NA 141 140 140  K 3.2* 3.6 3.8  CL 105 103 103  CO2 26 26 25  GLUCOSE 130* 232* 192*  BUN 31* 29* 27*  CREATININE 0.75 0.97 0.82  CALCIUM 7.9* 8.2* 8.3*  GFRNONAA >60 >60 >60  GFRAA >60 >60 >60  ANIONGAP _0 Recent Labs  Lab 02/03/19 0203 02/04/19 0217 02/05/19 0151  PROT 5.6* 5.5* 6.2*  ALBUMIN 2.7* 2.5* 2.5*  AST 47* 43* 53*  ALT _1 ALKPHOS 218* 216* 259*  BILITOT 10.0* 6.8* 6.4*   Hematology Recent Labs  Lab 02/02/19 0133 02/03/19 0203 02/04/19 0217  WBC 15.4* 15.7* 12.0*  RBC 3.11* 3.36* 3.27*  HGB 10.4* 11.3* 11.1*  HCT 30.5* 33.8* 32.8*  MCV 98.1 100.6* 100.3*  MCH 33.4 33.6 33.9  MCHC 34.1 33.4 33.8  RDW 14.3 14.6 14.7  PLT 107* 117* 122*   BNPNo results for input(s): BNP, PROBNP in the last 168 hours.  DDimer  Recent Labs  Lab 01/30/19 0332 01/31/19 0600  DDIMER 2.75* 2.91*     Radiology/Studies:  DG Abd 1  View  Result Date: 02/02/2019 CLINICAL DATA:  Initial evaluation for NG tube placement. EXAM: ABDOMEN - 1 VIEW COMPARISON:  Prior radiograph from 09/06/2015. FINDINGS: Enteric tube in place with tip seen overlying the region of the pylorus/proximal duodenum. Side hole well beyond the GE junction. Tip projects distally. Visualized bowel gas pattern within normal limits. IMPRESSION: Enteric tube in place with tip overlying the region of the pylorus/proximal duodenum. Tip well beyond the GE junction. Electronically Signed   By: Jeannine Boga M.D.   On: 02/02/2019 00:45   {  Assessment and Plan:   # Sinus tachycardia:  # Likely WPW: On review of telemetry, he has sinus tachycardia with frequent PACs.  There is no atrial fibrillation.  His PR interval is variable and often quite short.  I suspect that he is right and does, in fact, have WPW.  His current EKG does not show delta waves, but he did have them inferiorly in his EKGs arouns 2012/2013.  Therefore, would avoid beta blockers and calcium channel blockers.  Would not treat his sinus tachycardia with PACs pharmacologically, especially as he is asymptomatic.  This will likely resolve with treatment of the underlying infection/EtOH withdrawal, etc.  If he were to develop atrial fibrillation, prefer amiodarone.  This is problematic given his decompensated cirrhosis.  For now the best treatment is likely to avoid pharmacologic intervention unless truly necessary.  We will arrange for outpatient EP consultation.        For questions or updates, please contact [place] Please consult [email or web address] for contact info under     Signed, Skeet Latch, MD  02/05/2019 10:13 AM

## 2019-02-06 LAB — CBC WITH DIFFERENTIAL/PLATELET
Abs Immature Granulocytes: 0.3 10*3/uL — ABNORMAL HIGH (ref 0.00–0.07)
Basophils Absolute: 0.1 10*3/uL (ref 0.0–0.1)
Basophils Relative: 0 %
Eosinophils Absolute: 0 10*3/uL (ref 0.0–0.5)
Eosinophils Relative: 0 %
HCT: 34.3 % — ABNORMAL LOW (ref 39.0–52.0)
Hemoglobin: 11.2 g/dL — ABNORMAL LOW (ref 13.0–17.0)
Immature Granulocytes: 2 %
Lymphocytes Relative: 12 %
Lymphs Abs: 1.9 10*3/uL (ref 0.7–4.0)
MCH: 33.2 pg (ref 26.0–34.0)
MCHC: 32.7 g/dL (ref 30.0–36.0)
MCV: 101.8 fL — ABNORMAL HIGH (ref 80.0–100.0)
Monocytes Absolute: 1 10*3/uL (ref 0.1–1.0)
Monocytes Relative: 6 %
Neutro Abs: 12.7 10*3/uL — ABNORMAL HIGH (ref 1.7–7.7)
Neutrophils Relative %: 80 %
Platelets: 154 10*3/uL (ref 150–400)
RBC: 3.37 MIL/uL — ABNORMAL LOW (ref 4.22–5.81)
RDW: 15.3 % (ref 11.5–15.5)
WBC: 16 10*3/uL — ABNORMAL HIGH (ref 4.0–10.5)
nRBC: 0 % (ref 0.0–0.2)

## 2019-02-06 LAB — GLUCOSE, CAPILLARY
Glucose-Capillary: 151 mg/dL — ABNORMAL HIGH (ref 70–99)
Glucose-Capillary: 177 mg/dL — ABNORMAL HIGH (ref 70–99)
Glucose-Capillary: 177 mg/dL — ABNORMAL HIGH (ref 70–99)
Glucose-Capillary: 208 mg/dL — ABNORMAL HIGH (ref 70–99)
Glucose-Capillary: 210 mg/dL — ABNORMAL HIGH (ref 70–99)
Glucose-Capillary: 237 mg/dL — ABNORMAL HIGH (ref 70–99)

## 2019-02-06 LAB — COMPREHENSIVE METABOLIC PANEL
ALT: 25 U/L (ref 0–44)
AST: 32 U/L (ref 15–41)
Albumin: 2.7 g/dL — ABNORMAL LOW (ref 3.5–5.0)
Alkaline Phosphatase: 242 U/L — ABNORMAL HIGH (ref 38–126)
Anion gap: 12 (ref 5–15)
BUN: 20 mg/dL (ref 8–23)
CO2: 25 mmol/L (ref 22–32)
Calcium: 8.5 mg/dL — ABNORMAL LOW (ref 8.9–10.3)
Chloride: 101 mmol/L (ref 98–111)
Creatinine, Ser: 0.78 mg/dL (ref 0.61–1.24)
GFR calc Af Amer: >60 mL/min (ref >60)
GFR calc non Af Amer: >60 mL/min (ref >60)
Glucose, Bld: 225 mg/dL — ABNORMAL HIGH (ref 70–99)
Potassium: 3.7 mmol/L (ref 3.5–5.1)
Sodium: 138 mmol/L (ref 135–145)
Total Bilirubin: 5.8 mg/dL — ABNORMAL HIGH (ref 0.3–1.2)
Total Protein: 6.4 g/dL — ABNORMAL LOW (ref 6.5–8.1)

## 2019-02-06 LAB — AMMONIA: Ammonia: 26 umol/L (ref 9–35)

## 2019-02-06 MED ORDER — POTASSIUM CHLORIDE CRYS ER 20 MEQ PO TBCR
20.0000 meq | EXTENDED_RELEASE_TABLET | Freq: Every day | ORAL | Status: DC
  Administered 2019-02-07 – 2019-02-09 (×3): 20 meq via ORAL
  Filled 2019-02-06 (×3): qty 1

## 2019-02-06 NOTE — Progress Notes (Signed)
Will sign off  Call as needed

## 2019-02-06 NOTE — Progress Notes (Signed)
   Telemetry reviewed and discussed with Mr. Pfalzgraf.  He remains in sinus rhythm/sinus tachycardia with frequent PACs and occasional PVCs.  Continue treatment of his underlying illness.  Will arrange EP follow up for WPW.  We will sign off.  Please call if there are questions.     Signed, Skeet Latch, MD  02/06/2019, 10:19 AM

## 2019-02-06 NOTE — Progress Notes (Signed)
PROGRESS NOTE    Patrick Kidd  [id number] DOB: 1945-08-23 DOA: 01/28/2019 PCP: [name], MD   Brief Narrative:  Patient is a 74 year old male with history of type 2 diabetes mellitus, alcoholic cirrhosis, portal hypertension, PVD, BPH, recent Covid-19  infection presented to the emergency department with right-sided epigastric abdominal pain.  He also reported generalized weakness, fatigue, jaundice.  He drinks about sixteen 8 ounce drinks vodka a day.  In the emergency department, he was found to have elevated liver enzymes.  CT abdomen/pelvis showed mild distended gallbladder with surrounding inflammatory changes, small pericholecystic fluid suggesting  possible cholecystitis.  General surgery was consulted and he is not a candidate for any kind of surgical procedure.  GI and PCCM were also following him.   Underwent paracentesis and fluid analysis was suggestive of SBP.  Started on antibiotics. Mental status has significantly improved. Off precedex. Oral diet has been started.  Physical therapy /OT recommended skilled nursing  facility.  He is hemodynamically stable for skilled nursing facility as soon as bed is available.   Assessment & Plan:   Principal Problem:   Acute cholecystitis Active Problems:   GERD   Type 2 diabetes mellitus with complication, with long-term current use of insulin (HCC)   Essential hypertension   Transaminitis   COVID-19 virus infection   Ascites   Jaundice   Leucocytosis   SBP (spontaneous bacterial peritonitis) (HCC)   Decompensated hepatic cirrhosis (HCC)   WPW (Wolff-Parkinson-White syndrome)   Abdominal pain secondary to SBP/possible cholecystitis: CT abdomen/pelvis showed possible acute cholecystitis.  Right upper quadrant ultrasound showed gallstones, mild gallbladder wall thickening.general surgery was consulted and due to his child's class C cirrhosis he is not a candidate for surgical intervention.  General surgery thought that  this is less likely a cholecystitis. Underwent diagnostic paracentesis by IR and fluid analysis showed SBP.  MRCP did not show any bile duct dilation, no choledocholithiasis, showed asymmetric gallbladder thickening, cirrhosis. Peritoneal fluid culture has not shown any growth.  Blood cultures negative so far.  He was on Unasyn,changed to Augmentin which will be continued for total of 2 weeks  and then he will be on ciprofloxacin 500 mg daily for SBP prophylaxis. He does not complain of any abdominal pain today.  Decompensated alcohol cirrhosis/portal hypertension: CT abdomen showed hepatomegaly, cirrhosis, perihepatic ascites.  MELD score  of 28 on admission.  Mild elevated liver enzymes.  GI following.  On lactulose 3 times a day.  Also on rifaximin, and zinc.  Repeat paracentesis was attempted but he does not have enough fluid on his abdomen  WPW syndrome:There was concern for SVT/A. fib with RVR during this hospitalization, He actually has history of WPW syndrome.  Cardiology concluded that he does not have A. fib.  EKG showed frequent PACs.  Recommended to avoid beta-blockers, calcium.    Cardiology will arrange outpatient EP follow-up.  Alcohol abuse disorder with withdrawals: On 12/28, he became more confused, agitated, tachycardic exhibiting signs of alcohol withdrawal symptoms.  PCCM was following.  He was started on Precedex.  Off Precedex now.  Continue thiamine, folic acid  GERD: Continue PPI  Type 2 diabetes mellitus: Hemoglobin A1c of 8.9 as per 12/20.  On Metformin as an outpatient.  Continue Lantus and sliding scale.  Hypertension: BP better.  On lisinopril  Hyponatremia: Much improved  Recent Covid-19 infection: Diagnosed on 01/03/2019, treated with steroids.  Currently respiratory status stable.  Not on airborne or contact isolation.  Weakness/debility: PT/OT recommended skilled  nursing facility.  Leukocytosis: Likely reactive.We will check CBC tomorrow.  Morbid obesity:  BMI of 31.2.     Nutrition Problem: Increased nutrient needs Etiology: acute illness      DVT prophylaxis: Heparin [place] Code Status: Full Family Communication: Called wife on phone for updates on 02/04/18 Disposition Plan: SNF .  Hemodynamically stable for discharge to skilled nursing facility as soon as bed is available.   Consultants: None  Procedures: None  Antimicrobials:  Anti-infectives (From admission, onward)   Start     Dose/Rate Route Frequency Ordered Stop   02/04/19 1000  amoxicillin-clavulanate (AUGMENTIN) 875-125 MG per tablet 1 tablet     1 tablet Oral Every 12 hours 02/04/19 0858     02/02/19 1000  rifaximin (XIFAXAN) tablet 550 mg     550 mg Oral 2 times daily 02/02/19 0931     02/01/19 2200  rifaximin (XIFAXAN) tablet 200 mg  Status:  Discontinued     200 mg Oral 2 times daily 02/01/19 1550 02/02/19 0931   02/01/19 1800  Ampicillin-Sulbactam (UNASYN) 3 g in sodium chloride 0.9 % 100 mL IVPB  Status:  Discontinued     3 g 200 mL/hr over 30 Minutes Intravenous Every 6 hours 02/01/19 1159 02/04/19 0858   02/01/19 1600  metroNIDAZOLE (FLAGYL) IVPB 500 mg  Status:  Discontinued     500 mg 100 mL/hr over 60 Minutes Intravenous Every 8 hours 02/01/19 1550 02/01/19 1610   02/01/19 1000  amoxicillin-clavulanate (AUGMENTIN) 875-125 MG per tablet 1 tablet  Status:  Discontinued     1 tablet Oral Every 12 hours 02/01/19 0535 02/01/19 0930   02/01/19 1000  piperacillin-tazobactam (ZOSYN) IVPB 3.375 g  Status:  Discontinued     3.375 g 12.5 mL/hr over 240 Minutes Intravenous Every 8 hours 02/01/19 0935 02/01/19 1159   02/01/19 0530  cefTRIAXone (ROCEPHIN) 2 g in sodium chloride 0.9 % 100 mL IVPB  Status:  Discontinued     2 g 200 mL/hr over 30 Minutes Intravenous Every 24 hours 02/01/19 0532 02/01/19 0533   01/31/19 2200  amoxicillin-clavulanate (AUGMENTIN) 875-125 MG per tablet 1 tablet  Status:  Discontinued     1 tablet Oral Every 12 hours 01/31/19 1753 02/01/19 0532     01/29/19 0700  piperacillin-tazobactam (ZOSYN) IVPB 3.375 g  Status:  Discontinued     3.375 g 12.5 mL/hr over 240 Minutes Intravenous Every 8 hours 01/29/19 0403 01/31/19 1753   01/29/19 0030  piperacillin-tazobactam (ZOSYN) IVPB 3.375 g     3.375 g 100 mL/hr over 30 Minutes Intravenous  Once 01/29/19 0019 01/29/19 0123      Subjective:  Patient seen and examined at the bedside this morning.  Hemodynamically stable.  No new issues.  Feels comfortable. Objective: Vitals:   02/05/19 2023 02/06/19 0428 02/06/19 1051 02/06/19 1056  BP: (!) 154/65 (!) 151/60 (!) 149/68 (!) 149/68  Pulse: (!) 102 93  95  Resp: 20 20    Temp: 98.2 F (36.8 C) 98.2 F (36.8 C)    TempSrc:  Oral    SpO2: 98% 96%    Weight:      Height:        Intake/Output Summary (Last 24 hours) at 02/06/2019 1303 Last data filed at 02/06/2019 1057 Gross per 24 hour  Intake 843 ml  Output 800 ml  Net 43 ml   Filed Weights   01/31/19 0500 02/01/19 0500 02/02/19 0500  Weight: 112.6 kg 110.6 kg 110.5 kg    Examination:  General exam: Comfortable HEENT: Ear/Nose normal on gross exam Respiratory system: Bilateral equal air entry, normal vesicular breath sounds, no wheezes or crackles  Cardiovascular system: Regular rhythm. No JVD, murmurs, rubs, gallops or clicks. No pedal edema. Gastrointestinal system: Abdomen is distended, soft and nontender. No organomegaly or masses felt. Normal bowel sounds heard. Central nervous system:Not  Alert and oriented. No focal neurological deficits. Extremities: No edema, no clubbing ,no cyanosis, distal peripheral pulses palpable. Skin: Mild Icterus,No rashes, lesions or ulcers,no pallor    Data Reviewed: I have personally reviewed following labs and imaging studies  CBC: Recent Labs  Lab 02/01/19 0244 02/02/19 0133 02/03/19 0203 02/04/19 0217 02/06/19 0546  WBC 19.8* 15.4* 15.7* 12.0* 16.0*  NEUTROABS  --   --   --   --  12.7*  HGB 11.2* 10.4* 11.3* 11.1* 11.2*   HCT 33.0* 30.5* 33.8* 32.8* 34.3*  MCV 99.4 98.1 100.6* 100.3* 101.8*  PLT 108* 107* 117* 122* [id number]   Basic Metabolic Panel: Recent Labs  Lab 02/02/19 0133 02/03/19 0203 02/04/19 0217 02/04/19 2222 02/05/19 0151 02/06/19 0546  NA 135 137 141 140 140 138  K 3.6 3.5 3.2* 3.6 3.8 3.7  CL 98 99 105 103 103 101  CO2 27 26 26 26 25 25   GLUCOSE 142* 164* 130* 232* 192* 225*  BUN 29* 35* 31* 29* 27* 20  CREATININE 0.88 0.71 0.75 0.97 0.82 0.78  CALCIUM 8.0* 7.9* 7.9* 8.2* 8.3* 8.5*  MG 2.0 2.2 2.1 2.1  --   --    GFR: Estimated Creatinine Clearance: 107.1 mL/min (by C-G formula based on SCr of 0.78 mg/dL). Liver Function Tests: Recent Labs  Lab 02/02/19 0133 02/03/19 0203 02/04/19 0217 02/05/19 0151 02/06/19 0546  AST 37 47* 43* 53* 32  ALT 24 25 25 30 25   ALKPHOS 228* 218* 216* 259* 242*  BILITOT 10.5* 10.0* 6.8* 6.4* 5.8*  PROT 5.5* 5.6* 5.5* 6.2* 6.4*  ALBUMIN 2.6* 2.7* 2.5* 2.5* 2.7*   No results for input(s): LIPASE, AMYLASE in the last 168 hours. Recent Labs  Lab 02/01/19 1211 02/04/19 0217 02/05/19 0151 02/06/19 0546  AMMONIA 35 77* 35 26   Coagulation Profile: Recent Labs  Lab 01/31/19 0600 02/01/19 0244 02/02/19 0133 02/03/19 0203 02/04/19 0217  INR 1.3* 1.4* 1.2 1.2 1.2   Cardiac Enzymes: No results for input(s): CKTOTAL, CKMB, CKMBINDEX, TROPONINI in the last 168 hours. BNP (last 3 results) No results for input(s): PROBNP in the last 8760 hours. HbA1C: No results for input(s): HGBA1C in the last 72 hours. CBG: Recent Labs  Lab 02/05/19 2022 02/06/19 0010 02/06/19 0424 02/06/19 0748 02/06/19 1206  GLUCAP 310* 237* 208* 177* 210*   Lipid Profile: No results for input(s): CHOL, HDL, LDLCALC, TRIG, CHOLHDL, LDLDIRECT in the last 72 hours. Thyroid Function Tests: No results for input(s): TSH, T4TOTAL, FREET4, T3FREE, THYROIDAB in the last 72 hours. Anemia Panel: No results for input(s): VITAMINB12, FOLATE, FERRITIN, TIBC, IRON, RETICCTPCT  in the last 72 hours. Sepsis Labs: Recent Labs  Lab 01/31/19 0600 02/01/19 0244  PROCALCITON 2.76 2.10    Recent Results (from the past 240 hour(s))  Culture, blood (routine x 2)     Status: None   Collection Time: 01/29/19 12:23 AM   Specimen: BLOOD  Result Value Ref Range Status   Specimen Description   Final    BLOOD RIGHT ANTECUBITAL Performed at [facility] [address] [place], [place] [address]    Special Requests   Final  BOTTLES DRAWN AEROBIC AND ANAEROBIC Blood Culture adequate volume Performed at [facility] [address], [place], [place] [address]    Culture   Final    NO GROWTH 5 DAYS Performed at [facility], [place] [address], [place], [place] [address]    Report Status 02/03/2019 FINAL  Final  Culture, blood (routine x 2)     Status: None   Collection Time: 01/30/19  3:32 AM   Specimen: BLOOD  Result Value Ref Range Status   Specimen Description   Final    BLOOD LEFT HAND Performed at [facility] [address] [place], [place] [address]    Special Requests   Final    BOTTLES DRAWN AEROBIC ONLY Blood Culture adequate volume Performed at [facility] [address], [place], [place] [address]    Culture   Final    NO GROWTH 5 DAYS Performed at [facility], [place] [address], [place], [place] [address]    Report Status 02/04/2019 FINAL  Final  Aerobic/Anaerobic Culture (surgical/deep wound)     Status: None   Collection Time: 01/31/19  3:25 PM   Specimen: Abdomen; Peritoneal Fluid  Result Value Ref Range Status   Specimen Description   Final    PERITONEAL Performed at [facility] [address] [place], [place] [address]    Special Requests   Final    NONE Performed at [facility], [place] [address], [place], [place] [address]    Gram Stain   Final    ABUNDANT WBC PRESENT, PREDOMINANTLY PMN NO ORGANISMS SEEN     Culture   Final    No growth aerobically or anaerobically. Performed at [facility], [place] [address] [place], [place] [address]    Report Status 02/05/2019 FINAL  Final  MRSA PCR Screening     Status: None   Collection Time: 02/01/19  5:18 PM   Specimen: Nasal Mucosa; Nasopharyngeal  Result Value Ref Range Status   MRSA by PCR NEGATIVE NEGATIVE Final    Comment:        The GeneXpert MRSA Assay (FDA approved for NASAL specimens only), is one component of a comprehensive MRSA colonization surveillance program. It is not intended to diagnose MRSA infection nor to guide or monitor treatment for MRSA infections. Performed at [facility], [place] [address] [place], [place] [address]          Radiology Studies: [place] ASCITES (ABDOMEN LIMITED)  Result Date: 02/05/2019 CLINICAL DATA:  Ascites. EXAM: LIMITED ABDOMEN ULTRASOUND FOR ASCITES TECHNIQUE: Limited ultrasound survey for ascites was performed in all four abdominal quadrants. COMPARISON:  January 31, 2019. FINDINGS: Minimal amount of fluid is noted in the left lower quadrant. No other ascites is noted. IMPRESSION: Only minimal ascites is noted. Adequate fluid pocket for paracentesis was not identified. Electronically Signed   By: Marijo Conception M.D.   On: 02/05/2019 14:21        Scheduled Meds: . amoxicillin-clavulanate  1 tablet Oral Q12H  . Chlorhexidine Gluconate Cloth  6 each Topical Daily  . furosemide  20 mg Oral Daily  . heparin injection (subcutaneous)  5,000 Units Subcutaneous Q8H  . insulin aspart  0-9 Units Subcutaneous Q4H  . insulin glargine  25 Units Subcutaneous Daily  . lactulose  10 g Oral TID  . lisinopril  10 mg Oral Daily  . mouth rinse  15 mL Mouth Rinse BID  .  multivitamin with minerals  1 tablet Oral Daily  . pantoprazole  40 mg Oral BID  . potassium chloride  40 mEq Oral Daily  . rifaximin  550 mg Oral BID  . sodium chloride flush  3 mL Intravenous Q12H  .  spironolactone  50 mg Oral Daily  . thiamine  100 mg Oral Daily   Or  . thiamine  100 mg Intravenous Daily  . zinc sulfate  220 mg Oral Daily   Continuous Infusions: . sodium chloride 250 mL (01/30/19 1420)  . sodium chloride 500 mL (02/04/19 0410)  . dexmedetomidine (PRECEDEX) IV infusion Stopped (02/04/19 0230)     LOS: 8 days    Time spent: 25 mins.More than 50% of that time was spent in counseling and/or coordination of care.      Shelly Coss, MD [facility] Pager [phone]  If 7PM-7AM, please contact night-coverage [email or web address] Password [email or web address] 02/06/2019, 1:03 PM

## 2019-02-06 NOTE — Progress Notes (Addendum)
[facility] ROUNDING NOTE   Subjective: He feels well, wants to go home.  Denies any abdominal pain, nausea or vomiting.   Objective: Vital signs in last 24 hours: Temp:  [98.2 F (36.8 C)-98.6 F (37 C)] 98.2 F (36.8 C) (01/03 0428) Pulse Rate:  [87-102] 93 (01/03 0428) Resp:  [18-23] 20 (01/03 0428) BP: (151-175)/(60-91) 151/60 (01/03 0428) SpO2:  [95 %-98 %] 96 % (01/03 0428) Last BM Date: (rectal tube) General: NAD Lungs: clear Heart: s1s2  Abdomen: Soft distended, nontender  Ext: No edema No asterixis, alert oriented, answers appropriately    Intake/Output from previous day: 01/02 0701 - 01/03 0700 In: 840 [P.O.:240] Out: 800 [Urine:800] Intake/Output this shift: No intake/output data recorded.   Lab Results: Recent Labs    02/04/19 0217 02/06/19 0546  WBC 12.0* 16.0*  HGB 11.1* 11.2*  PLT 122* 154  MCV 100.3* 101.8*   BMET Recent Labs    02/04/19 2222 02/05/19 0151 02/06/19 0546  NA 140 140 138  K 3.6 3.8 3.7  CL 103 103 101  CO2 26 25 25   GLUCOSE 232* 192* 225*  BUN 29* 27* 20  CREATININE 0.97 0.82 0.78  CALCIUM 8.2* 8.3* 8.5*   LFT Recent Labs    02/04/19 0217 02/05/19 0151 02/06/19 0546  PROT 5.5* 6.2* 6.4*  ALBUMIN 2.5* 2.5* 2.7*  AST 43* 53* 32  ALT 25 30 25   ALKPHOS 216* 259* 242*  BILITOT 6.8* 6.4* 5.8*  BILIDIR 3.5*  --   --   IBILI 3.3*  --   --    PT/INR Recent Labs    02/04/19 0217  INR 1.2      Imaging/Other results: [place] ASCITES (ABDOMEN LIMITED)  Result Date: 02/05/2019 CLINICAL DATA:  Ascites. EXAM: LIMITED ABDOMEN ULTRASOUND FOR ASCITES TECHNIQUE: Limited ultrasound survey for ascites was performed in all four abdominal quadrants. COMPARISON:  January 31, 2019. FINDINGS: Minimal amount of fluid is noted in the left lower quadrant. No other ascites is noted. IMPRESSION: Only minimal ascites is noted. Adequate fluid pocket for paracentesis was not identified. Electronically Signed   By: Marijo Conception M.D.   On: 02/05/2019 14:21      Assessment &Plan  74 year old male with history of EtOH cirrhosis decompensated by jaundice and SBP MELD 15  Currently on oral Augmentin.  Prior to that he was on IV Unasyn for 4 days. Complete 14-day course total for IV Unasyn and Augmentin After that will need Cipro 500 mg daily for secondary prophylaxis for SBP, can discontinue once he has no ascites  Mild increase in leukocytosis, continue to monitor No growth to date on ascites fluid culture and blood cultures  No safe pocket for paracentesis, has only minimal ascites based on ultrasound Discontinue diuretics on discharge, he will not need them as outpatient  Hepatic encephalopathy stable Continue Xifaxan, will decrease lactulose to 10 g twice daily with goal 3 bowel movements per day Continue to monitor clinically, can hold off checking ammonia daily Consider removing rectal tube  Anemia: No overt GI bleeding Is due for varices screening and surveillance colonoscopy with history of polyps, will arrange for it as outpatient once acute issues resolve  Thrombocytopenia and coagulopathy secondary to cirrhosis  Follow-up in GI office in 3 to 4 weeks with Dr [name] or extender  Okay to discharge from hospital from GI standpoint  GI will sign off, available if have any questions  Called his wife and gave her an update    K.  Denzil Magnuson , MD [phone]  [facility]

## 2019-02-07 LAB — SARS CORONAVIRUS 2 (TAT 6-24 HRS): SARS Coronavirus 2: NEGATIVE

## 2019-02-07 LAB — GLUCOSE, CAPILLARY
Glucose-Capillary: 132 mg/dL — ABNORMAL HIGH (ref 70–99)
Glucose-Capillary: 139 mg/dL — ABNORMAL HIGH (ref 70–99)
Glucose-Capillary: 147 mg/dL — ABNORMAL HIGH (ref 70–99)
Glucose-Capillary: 154 mg/dL — ABNORMAL HIGH (ref 70–99)
Glucose-Capillary: 192 mg/dL — ABNORMAL HIGH (ref 70–99)
Glucose-Capillary: 263 mg/dL — ABNORMAL HIGH (ref 70–99)

## 2019-02-07 LAB — CBC WITH DIFFERENTIAL/PLATELET
Abs Immature Granulocytes: 0.28 10*3/uL — ABNORMAL HIGH (ref 0.00–0.07)
Basophils Absolute: 0.1 10*3/uL (ref 0.0–0.1)
Basophils Relative: 0 %
Eosinophils Absolute: 0.1 10*3/uL (ref 0.0–0.5)
Eosinophils Relative: 0 %
HCT: 29.6 % — ABNORMAL LOW (ref 39.0–52.0)
Hemoglobin: 9.7 g/dL — ABNORMAL LOW (ref 13.0–17.0)
Immature Granulocytes: 2 %
Lymphocytes Relative: 10 %
Lymphs Abs: 1.8 10*3/uL (ref 0.7–4.0)
MCH: 33.4 pg (ref 26.0–34.0)
MCHC: 32.8 g/dL (ref 30.0–36.0)
MCV: 102.1 fL — ABNORMAL HIGH (ref 80.0–100.0)
Monocytes Absolute: 1.2 10*3/uL — ABNORMAL HIGH (ref 0.1–1.0)
Monocytes Relative: 7 %
Neutro Abs: 13.6 10*3/uL — ABNORMAL HIGH (ref 1.7–7.7)
Neutrophils Relative %: 81 %
Platelets: 148 10*3/uL — ABNORMAL LOW (ref 150–400)
RBC: 2.9 MIL/uL — ABNORMAL LOW (ref 4.22–5.81)
RDW: 15.2 % (ref 11.5–15.5)
WBC: 17 10*3/uL — ABNORMAL HIGH (ref 4.0–10.5)
nRBC: 0 % (ref 0.0–0.2)

## 2019-02-07 MED ORDER — ENSURE ENLIVE PO LIQD
237.0000 mL | Freq: Two times a day (BID) | ORAL | Status: DC
  Administered 2019-02-07 – 2019-02-08 (×3): 237 mL via ORAL

## 2019-02-07 MED ORDER — PRO-STAT SUGAR FREE PO LIQD
30.0000 mL | Freq: Two times a day (BID) | ORAL | Status: DC
  Administered 2019-02-07 – 2019-02-08 (×2): 30 mL via ORAL
  Filled 2019-02-07 (×3): qty 30

## 2019-02-07 MED ORDER — BOOST / RESOURCE BREEZE PO LIQD CUSTOM
1.0000 | ORAL | Status: DC
  Administered 2019-02-08 – 2019-02-09 (×2): 1 via ORAL

## 2019-02-07 NOTE — NC FL2 (Signed)
[place] LEVEL OF CARE SCREENING TOOL     IDENTIFICATION  Patient Name: Patrick Kidd Birthdate: September 22, 1945 Sex: male Admission Date (Current Location): 01/28/2019  [facility] Number:  Herbalist and Address:  [facility],  [place] [address], [place]      Provider Number: [id number]  Attending Physician Name and Address:  Shelly Coss, MD  Relative Name and Phone Number:       Current Level of Care: Hospital Recommended Level of Care: [place] Prior Approval Number:    Date Approved/Denied:   PASRR Number: [id number]  Discharge Plan: SNF    Current Diagnoses: Patient Active Problem List   Diagnosis Date Noted  . WPW (Wolff-Parkinson-White syndrome) 02/05/2019  . SBP (spontaneous bacterial peritonitis) ([place])   . Decompensated hepatic cirrhosis ([place])   . Ascites   . Jaundice   . Leucocytosis   . Abnormal liver function 01/29/2019  . Acute cholecystitis 01/29/2019  . Transaminitis 01/29/2019  . COVID-19 virus infection 01/29/2019  . Weakness 11/22/2018  . Degenerative disc disease, cervical 07/21/2018  . Cervical radiculopathy 07/16/2018  . Mastoiditis, acute, right 07/06/2018  . OAB (overactive bladder) 07/06/2018  . Malignant neoplasm of prostate ([place]) 01/13/2018  . Thiamine deficiency with Wernicke-Korsakoff syndrome in adult [facility]) 11/16/2017  . Hyperlipidemia LDL goal <100 11/11/2017  . Essential hypertension 11/11/2017  . BPH associated with nocturia 11/11/2017  . Erectile dysfunction due to arterial insufficiency 04/07/2017  . Medicare annual wellness visit, subsequent 10/02/2016  . Peripheral vascular disease ([place]) 06/25/2016  . Alcoholic cirrhosis ([place]) 123456  . Liver disease 10/04/2015  . Type 2 diabetes mellitus with complication, with long-term current use of insulin ([place]) 08/22/2015  . Thrombocytopenia ([place]) 03/19/2014  . Hypersomnolence 03/19/2014  . Severe  obesity with body mass index (BMI) of 35.0 to 39.9 with comorbidity ([place]) 07/19/2013  . Peripheral neuropathy 01/11/2013  . Polyclonal gammopathy 01/11/2013  . GERD 10/04/2009    Orientation RESPIRATION BLADDER Height & Weight     Self, Time, Place  Normal Incontinent Weight: 110.7 kg Height:  6\' 2"  (188 cm)  BEHAVIORAL SYMPTOMS/MOOD NEUROLOGICAL BOWEL NUTRITION STATUS      Incontinent Diet  AMBULATORY STATUS COMMUNICATION OF NEEDS Skin   Extensive Assist   Normal                       Personal Care Assistance Level of Assistance  Bathing, Dressing Bathing Assistance: Maximum assistance   Dressing Assistance: Limited assistance     Functional Limitations Info             SPECIAL CARE FACTORS FREQUENCY  PT (By licensed PT), OT (By licensed OT)     PT Frequency: 5x weekly OT Frequency: 5x weekly            Contractures Contractures Info: Not present    Additional Factors Info  Code Status, Allergies Code Status Info: full Allergies Info: Beta Adrenergic Blockers, Calcium Channel Blockers           Current Medications (02/07/2019):  This is the current hospital active medication list Current Facility-Administered Medications  Medication Dose Route Frequency Provider Last Rate Last Admin  . 0.9 %  sodium chloride infusion  250 mL Intravenous PRN Jani Gravel, MD 10 mL/hr at 01/30/19 1420 250 mL at 01/30/19 1420  . 0.9 %  sodium chloride infusion   Intravenous PRN [place] Eric J, DO 10 mL/hr at 02/04/19 0410 500 mL  at 02/04/19 0410  . alum & mag hydroxide-simeth (MAALOX/MYLANTA) 200-200-20 MG/5ML suspension 30 mL  30 mL Oral Q6H PRN Shelly Coss, MD   30 mL at 02/05/19 2328  . amoxicillin-clavulanate (AUGMENTIN) 875-125 MG per tablet 1 tablet  1 tablet Oral Q12H Shelly Coss, MD   1 tablet at 02/07/19 0834  . benzonatate (TESSALON) capsule 200 mg  200 mg Oral TID PRN Lang Snow, FNP   200 mg at 01/31/19 2309  . diltiazem (CARDIZEM) injection 10 mg   10 mg Intravenous Q4H PRN Bodenheimer, Charles A, NP   10 mg at 02/04/19 2212  . furosemide (LASIX) tablet 20 mg  20 mg Oral Daily Nandigam, Kavitha V, MD   20 mg at 02/07/19 0835  . heparin injection 5,000 Units  5,000 Units Subcutaneous Q8H Spero Geralds, MD   5,000 Units at 02/07/19 1254  . hydrALAZINE (APRESOLINE) injection 10 mg  10 mg Intravenous Q6H PRN Rai, Ripudeep K, MD   10 mg at 02/06/19 2200  . insulin aspart (novoLOG) injection 0-9 Units  0-9 Units Subcutaneous Q4H Jani Gravel, MD   5 Units at 02/07/19 1249  . insulin glargine (LANTUS) injection 25 Units  25 Units Subcutaneous Daily [place] Eric J, [place]   25 Units at 02/07/19 [id number]  . lactulose (CHRONULAC) 10 GM/15ML solution 10 g  10 g Oral TID Mauri Pole, MD   10 g at 02/07/19 0832  . lisinopril (ZESTRIL) tablet 10 mg  10 mg Oral Daily Shelly Coss, MD   10 mg at 02/07/19 0836  . MEDLINE mouth rinse  15 mL Mouth Rinse BID [place] Eric J, DO   15 mL at 02/07/19 [id number]  . morphine 2 MG/ML injection 2 mg  2 mg Intravenous Q3H PRN Vertis Kelch, NP   2 mg at 02/06/19 2158  . multivitamin with minerals tablet 1 tablet  1 tablet Oral Daily Shelly Coss, MD   1 tablet at 02/07/19 0834  . pantoprazole (PROTONIX) EC tablet 40 mg  40 mg Oral BID Shelly Coss, MD   40 mg at 02/07/19 0836  . potassium chloride SA (KLOR-CON) CR tablet 20 mEq  20 mEq Oral Daily Shelly Coss, MD   20 mEq at 02/07/19 0835  . rifaximin (XIFAXAN) tablet 550 mg  550 mg Oral BID Spero Geralds, MD   550 mg at 02/07/19 0835  . sodium chloride flush (NS) 0.9 % injection 3 mL  3 mL Intravenous Q12H Jani Gravel, MD   3 mL at 02/07/19 0837  . sodium chloride flush (NS) 0.9 % injection 3 mL  3 mL Intravenous PRN Jani Gravel, MD      . thiamine tablet 100 mg  100 mg Oral Daily Jani Gravel, MD   100 mg at 02/07/19 [id number]   Or  . thiamine (B-1) injection 100 mg  100 mg Intravenous Daily Jani Gravel, MD   100 mg at 02/04/19 1004  . zinc sulfate capsule  220 mg  220 mg Oral Daily Lang Snow, FNP   220 mg at 02/07/19 [id number]     Discharge Medications: Please see discharge summary for a list of discharge medications.  Relevant Imaging Results:  Relevant Lab Results:   Additional Information SSN [id number]  [name], RN

## 2019-02-07 NOTE — Progress Notes (Signed)
Physical Therapy Treatment Patient Details Name: Patrick Kidd MRN: [id number] DOB: [birth year] Today's Date: 02/07/2019    History of Present Illness 74 year old Caucasian male with past medical history remarkable for type 2 diabetes mellitus, EtOH cirrhosis, portal hypertension, PUD, BPH, recent Covid-19 infection (01/03/2019)  that was treated with steroids who presented to the ED with right-sided epigastric abdominal pain.  He also reported generalized weakness, fatigue and yellowing to his skin.  He has been drinking vodka, reports roughly sixteen 8 ounce drinks per day. Pt with severe alcohol withdrawal since admission.    PT Comments    General Comments: AxO x 4 pt surprised he was so weak.  Assisted OOB to amb. General bed mobility comments: Required Min Assist B LE and Mod assist for upper body and scooting.  Max c/o fatigue after just that. General transfer comment: pt required + 2 side by side assist to power up due to weakness and required assist to lower to recliner.  General Gait Details: VERY limited activity tolerance and gait distance for his age.  Required + 2 assist such that recliner was following and assist to stand from seated position.  Max c/o weakness and fatigue. Pt stated he has been in poor health since Thanksgiving.     Follow Up Recommendations  SNF     Equipment Recommendations       Recommendations for Other Services       Precautions / Restrictions Precautions Precautions: Fall Restrictions Weight Bearing Restrictions: No    Mobility  Bed Mobility Overal bed mobility: Needs Assistance Bed Mobility: Supine to Sit     Supine to sit: Mod assist;Min assist     General bed mobility comments: Required Min Assist B LE and Mod assist for upper body and scooting.  Max c/o fatigue after just that.  Transfers Overall transfer level: Needs assistance Equipment used: Rolling walker (2 wheeled) Transfers: Sit to/from Stand Sit to Stand: +2 physical  assistance;Mod assist;Min assist         General transfer comment: pt required + 2 side by side assist to power up due to weakness and required assist to lower to recliner  Ambulation/Gait Ambulation/Gait assistance: Min assist;Mod assist;+2 physical assistance;+2 safety/equipment Gait Distance (Feet): 18 Feet(3 seated rest breaks to achieve a total of 18 feet) Assistive device: Rolling walker (2 wheeled) Gait Pattern/deviations: Step-through pattern;Decreased stride length;Trunk flexed Gait velocity: decreased   General Gait Details: VERY limited activity tolerance and gait distance for his age.  Required + 2 assist such that recliner was following and assist to stand from seated position.  Max c/o weakness and fatigue.   Stairs             Wheelchair Mobility    Modified Rankin (Stroke Patients Only)       Balance                                            Cognition Arousal/Alertness: Awake/alert Behavior During Therapy: WFL for tasks assessed/performed Overall Cognitive Status: Within Functional Limits for tasks assessed                                 General Comments: AxO x 4 pt surprised he was so weak      Exercises      General Comments  Pertinent Vitals/Pain Pain Assessment: No/denies pain Pain Descriptors / Indicators: Aching    Home Living                      Prior Function            PT Goals (current goals can now be found in the care plan section) Progress towards PT goals: Progressing toward goals    Frequency    Min 2X/week      PT Plan Current plan remains appropriate    Co-evaluation              AM-PAC PT "6 Clicks" Mobility   Outcome Measure  Help needed turning from your back to your side while in a flat bed without using bedrails?: A Lot Help needed moving from lying on your back to sitting on the side of a flat bed without using bedrails?: A Lot Help needed  moving to and from a bed to a chair (including a wheelchair)?: A Lot Help needed standing up from a chair using your arms (e.g., wheelchair or bedside chair)?: A Lot Help needed to walk in hospital room?: A Lot Help needed climbing 3-5 steps with a railing? : Total 6 Click Score: 11    End of Session Equipment Utilized During Treatment: Gait belt Activity Tolerance: Patient limited by fatigue Patient left: in chair;with call bell/phone within reach;with chair alarm set;with nursing/sitter in room Nurse Communication: Mobility status PT Visit Diagnosis: Difficulty in walking, not elsewhere classified (R26.2);Muscle weakness (generalized) (M62.81)     Time: 1000-1025 PT Time Calculation (min) (ACUTE ONLY): 25 min  Charges:  $Gait Training: 8-22 mins $Therapeutic Activity: 8-22 mins                     Rica Koyanagi  PTA Acute  Rehabilitation Services Pager      [phone] Office      [phone]

## 2019-02-07 NOTE — Progress Notes (Signed)
PROGRESS NOTE    Patrick Kidd  [id number] DOB: [birth year] DOA: 01/28/2019 PCP: [name], MD   Brief Narrative:  Patient is a 74 year old male with history of type 2 diabetes mellitus, alcoholic cirrhosis, portal hypertension, PVD, BPH, recent Covid-19  infection presented to the emergency department with right-sided epigastric abdominal pain.  He also reported generalized weakness, fatigue, jaundice.  He drinks about sixteen 8 ounce drinks vodka a day.  In the emergency department, he was found to have elevated liver enzymes.  CT abdomen/pelvis showed mild distended gallbladder with surrounding inflammatory changes, small pericholecystic fluid suggesting  possible cholecystitis.  General surgery was consulted and he is not a candidate for any kind of surgical procedure.  GI and PCCM were also following him.   Underwent paracentesis and fluid analysis was suggestive of SBP.  Started on antibiotics. Mental status has significantly improved. Off precedex. Oral diet has been started.  Physical therapy /OT recommended skilled nursing  facility.  He is hemodynamically stable for skilled nursing facility as soon as bed is available.   Assessment & Plan:   Principal Problem:   Acute cholecystitis Active Problems:   GERD   Type 2 diabetes mellitus with complication, with long-term current use of insulin (HCC)   Essential hypertension   Transaminitis   COVID-19 virus infection   Ascites   Jaundice   Leucocytosis   SBP (spontaneous bacterial peritonitis) (HCC)   Decompensated hepatic cirrhosis (HCC)   WPW (Wolff-Parkinson-White syndrome)   Abdominal pain secondary to SBP/possible cholecystitis: CT abdomen/pelvis showed possible acute cholecystitis.  Right upper quadrant ultrasound showed gallstones, mild gallbladder wall thickening.general surgery was consulted and due to his child's class C cirrhosis he is not a candidate for surgical intervention.  General surgery thought that  this is less likely a cholecystitis. Underwent diagnostic paracentesis by IR and fluid analysis showed SBP.  MRCP did not show any bile duct dilation, no choledocholithiasis, showed asymmetric gallbladder thickening, cirrhosis. Peritoneal fluid culture has not shown any growth.  Blood cultures negative so far.  He was on Unasyn,changed to Augmentin which will be continued for total of 2 weeks  and then he will be on ciprofloxacin 500 mg daily for SBP prophylaxis. He does not complain of any abdominal pain today.  Decompensated alcohol cirrhosis/portal hypertension: CT abdomen showed hepatomegaly, cirrhosis, perihepatic ascites.  MELD score  of 28 on admission.  Mild elevated liver enzymes.  GI following.  On lactulose 3 times a day.  Also on rifaximin, and zinc.  Repeat paracentesis was attempted but he does not have enough fluid on his abdomen  WPW syndrome:There was concern for SVT/A. fib with RVR during this hospitalization, He actually has history of WPW syndrome.  Cardiology concluded that he does not have A. fib.  EKG showed frequent PACs.  Recommended to avoid beta-blockers, calcium.    Cardiology will arrange outpatient EP follow-up.  Alcohol abuse disorder with withdrawals: On 12/28, he became more confused, agitated, tachycardic exhibiting signs of alcohol withdrawal symptoms.  PCCM was following.  He was started on Precedex.  Off Precedex now.  Continue thiamine, folic acid  GERD: Continue PPI  Type 2 diabetes mellitus: Hemoglobin A1c of 8.9 as per 12/20.  On Metformin as an outpatient.  Continue Lantus and sliding scale.  Hypertension: BP better.  On lisinopril  Hyponatremia: Much improved  Recent Covid-19 infection: Diagnosed on 01/03/2019, treated with steroids.  Currently respiratory status stable.  Not on airborne or contact isolation.  Weakness/debility: PT/OT recommended skilled  nursing facility.  Leukocytosis: Likely reactive.We will check CBC tomorrow.  Morbid obesity:  BMI of 31.2.     Nutrition Problem: Increased nutrient needs Etiology: acute illness      DVT prophylaxis: Heparin Latta Code Status: Full Family Communication: Called wife on phone for updates on 02/04/18.called again today,call not received  Disposition Plan: SNF .  Hemodynamically stable for discharge to skilled nursing facility as soon as bed is available.   Consultants: None  Procedures: None  Antimicrobials:  Anti-infectives (From admission, onward)   Start     Dose/Rate Route Frequency Ordered Stop   02/04/19 1000  amoxicillin-clavulanate (AUGMENTIN) 875-125 MG per tablet 1 tablet     1 tablet Oral Every 12 hours 02/04/19 0858     02/02/19 1000  rifaximin (XIFAXAN) tablet 550 mg     550 mg Oral 2 times daily 02/02/19 0931     02/01/19 2200  rifaximin (XIFAXAN) tablet 200 mg  Status:  Discontinued     200 mg Oral 2 times daily 02/01/19 1550 02/02/19 0931   02/01/19 1800  Ampicillin-Sulbactam (UNASYN) 3 g in sodium chloride 0.9 % 100 mL IVPB  Status:  Discontinued     3 g 200 mL/hr over 30 Minutes Intravenous Every 6 hours 02/01/19 1159 02/04/19 0858   02/01/19 1600  metroNIDAZOLE (FLAGYL) IVPB 500 mg  Status:  Discontinued     500 mg 100 mL/hr over 60 Minutes Intravenous Every 8 hours 02/01/19 1550 02/01/19 1610   02/01/19 1000  amoxicillin-clavulanate (AUGMENTIN) 875-125 MG per tablet 1 tablet  Status:  Discontinued     1 tablet Oral Every 12 hours 02/01/19 0535 02/01/19 0930   02/01/19 1000  piperacillin-tazobactam (ZOSYN) IVPB 3.375 g  Status:  Discontinued     3.375 g 12.5 mL/hr over 240 Minutes Intravenous Every 8 hours 02/01/19 0935 02/01/19 1159   02/01/19 0530  cefTRIAXone (ROCEPHIN) 2 g in sodium chloride 0.9 % 100 mL IVPB  Status:  Discontinued     2 g 200 mL/hr over 30 Minutes Intravenous Every 24 hours 02/01/19 0532 02/01/19 0533   01/31/19 2200  amoxicillin-clavulanate (AUGMENTIN) 875-125 MG per tablet 1 tablet  Status:  Discontinued     1 tablet Oral Every  12 hours 01/31/19 1753 02/01/19 0532   01/29/19 0700  piperacillin-tazobactam (ZOSYN) IVPB 3.375 g  Status:  Discontinued     3.375 g 12.5 mL/hr over 240 Minutes Intravenous Every 8 hours 01/29/19 0403 01/31/19 1753   01/29/19 0030  piperacillin-tazobactam (ZOSYN) IVPB 3.375 g     3.375 g 100 mL/hr over 30 Minutes Intravenous  Once 01/29/19 0019 01/29/19 0123      Subjective:  Patient seen and examined the bedside this morning.  Hemodynamically stable.  Comfortable.  No active issues.  We updated our plan to discharge him to skilled nursing facility.  Patient interested in home health.  We are waiting to talk to the wife.   Objective: Vitals:   02/06/19 1056 02/06/19 1321 02/06/19 2009 02/07/19 0430  BP: (!) 149/68 139/74 (!) 129/58 132/66  Pulse: 95 83 99 89  Resp:  18 20 18   Temp:  98 F (36.7 C) 99 F (37.2 C) 98.7 F (37.1 C)  TempSrc:  Oral Oral Oral  SpO2:  97% 95% 96%  Weight:    110.7 kg  Height:        Intake/Output Summary (Last 24 hours) at 02/07/2019 1320 Last data filed at 02/07/2019 0837 Gross per 24 hour  Intake 3 ml  Output 350 ml  Net -347 ml   Filed Weights   02/01/19 0500 02/02/19 0500 02/07/19 0430  Weight: 110.6 kg 110.5 kg 110.7 kg    Examination:  General exam: Comfortable HEENT: Ear/Nose normal on gross exam Respiratory system: Bilateral equal air entry, normal vesicular breath sounds, no wheezes or crackles  Cardiovascular system: Regular rhythm. No JVD, murmurs, rubs, gallops or clicks. No pedal edema. Gastrointestinal system: Abdomen is distended, soft and nontender. No organomegaly or masses felt. Normal bowel sounds heard. Central nervous system:Not  Alert and oriented. No focal neurological deficits. Extremities: No edema, no clubbing ,no cyanosis, distal peripheral pulses palpable. Skin: Mild Icterus,No rashes, lesions or ulcers,no pallor    Data Reviewed: I have personally reviewed following labs and imaging studies  CBC: Recent  Labs  Lab 02/02/19 0133 02/03/19 0203 02/04/19 0217 02/06/19 0546 02/07/19 0521  WBC 15.4* 15.7* 12.0* 16.0* 17.0*  NEUTROABS  --   --   --  12.7* 13.6*  HGB 10.4* 11.3* 11.1* 11.2* 9.7*  HCT 30.5* 33.8* 32.8* 34.3* 29.6*  MCV 98.1 100.6* 100.3* 101.8* 102.1*  PLT 107* 117* 122* 154 [id number]*   Basic Metabolic Panel: Recent Labs  Lab 02/02/19 0133 02/03/19 0203 02/04/19 0217 02/04/19 2222 02/05/19 0151 02/06/19 0546  NA 135 137 141 140 140 138  K 3.6 3.5 3.2* 3.6 3.8 3.7  CL 98 99 105 103 103 101  CO2 27 26 26 26 25 25   GLUCOSE 142* 164* 130* 232* 192* 225*  BUN 29* 35* 31* 29* 27* 20  CREATININE 0.88 0.71 0.75 0.97 0.82 0.78  CALCIUM 8.0* 7.9* 7.9* 8.2* 8.3* 8.5*  MG 2.0 2.2 2.1 2.1  --   --    GFR: Estimated Creatinine Clearance: 107.3 mL/min (by C-G formula based on SCr of 0.78 mg/dL). Liver Function Tests: Recent Labs  Lab 02/02/19 0133 02/03/19 0203 02/04/19 0217 02/05/19 0151 02/06/19 0546  AST 37 47* 43* 53* 32  ALT 24 25 25 30 25   ALKPHOS 228* 218* 216* 259* 242*  BILITOT 10.5* 10.0* 6.8* 6.4* 5.8*  PROT 5.5* 5.6* 5.5* 6.2* 6.4*  ALBUMIN 2.6* 2.7* 2.5* 2.5* 2.7*   No results for input(s): LIPASE, AMYLASE in the last 168 hours. Recent Labs  Lab 02/01/19 1211 02/04/19 0217 02/05/19 0151 02/06/19 0546  AMMONIA 35 77* 35 26   Coagulation Profile: Recent Labs  Lab 02/01/19 0244 02/02/19 0133 02/03/19 0203 02/04/19 0217  INR 1.4* 1.2 1.2 1.2   Cardiac Enzymes: No results for input(s): CKTOTAL, CKMB, CKMBINDEX, TROPONINI in the last 168 hours. BNP (last 3 results) No results for input(s): PROBNP in the last 8760 hours. HbA1C: No results for input(s): HGBA1C in the last 72 hours. CBG: Recent Labs  Lab 02/06/19 2007 02/07/19 0024 02/07/19 0429 02/07/19 0736 02/07/19 1233  GLUCAP 151* 139* 132* 147* 263*   Lipid Profile: No results for input(s): CHOL, HDL, LDLCALC, TRIG, CHOLHDL, LDLDIRECT in the last 72 hours. Thyroid Function Tests: No  results for input(s): TSH, T4TOTAL, FREET4, T3FREE, THYROIDAB in the last 72 hours. Anemia Panel: No results for input(s): VITAMINB12, FOLATE, FERRITIN, TIBC, IRON, RETICCTPCT in the last 72 hours. Sepsis Labs: Recent Labs  Lab 02/01/19 0244  PROCALCITON 2.10    Recent Results (from the past 240 hour(s))  Culture, blood (routine x 2)     Status: None   Collection Time: 01/29/19 12:23 AM   Specimen: BLOOD  Result Value Ref Range Status   Specimen Description   Final  BLOOD RIGHT ANTECUBITAL Performed at [facility] [address] [place], [place] [address]    Special Requests   Final    BOTTLES DRAWN AEROBIC AND ANAEROBIC Blood Culture adequate volume Performed at [facility] [address], [place], [place] [address]    Culture   Final    NO GROWTH 5 DAYS Performed at [facility], [place] [address] [place], [place] [address]    Report Status 02/03/2019 FINAL  Final  Culture, blood (routine x 2)     Status: None   Collection Time: 01/30/19  3:32 AM   Specimen: BLOOD  Result Value Ref Range Status   Specimen Description   Final    BLOOD LEFT HAND Performed at [facility] [address], [place], [place] [address]    Special Requests   Final    BOTTLES DRAWN AEROBIC ONLY Blood Culture adequate volume Performed at [facility] [address], [place], [place] [address]    Culture   Final    NO GROWTH 5 DAYS Performed at [facility], [place] [address], [place], [place] [address]    Report Status 02/04/2019 FINAL  Final  Aerobic/Anaerobic Culture (surgical/deep wound)     Status: None   Collection Time: 01/31/19  3:25 PM   Specimen: Abdomen; Peritoneal Fluid  Result Value Ref Range Status   Specimen Description   Final    PERITONEAL Performed at [facility] [address] [place], [place] [address]    Special Requests   Final    NONE Performed at [facility], [place] [address] [place] [address] [place], [place] [address]    Gram Stain   Final    ABUNDANT WBC PRESENT, PREDOMINANTLY PMN NO ORGANISMS SEEN    Culture   Final    No growth aerobically or anaerobically. Performed at [facility], [place] [address], [place], [place] [address]    Report Status 02/05/2019 FINAL  Final  MRSA PCR Screening     Status: None   Collection Time: 02/01/19  5:18 PM   Specimen: Nasal Mucosa; Nasopharyngeal  Result Value Ref Range Status   MRSA by PCR NEGATIVE NEGATIVE Final    Comment:        The GeneXpert MRSA Assay (FDA approved for NASAL specimens only), is one component of a comprehensive MRSA colonization surveillance program. It is not intended to diagnose MRSA infection nor to guide or monitor treatment for MRSA infections. Performed at [facility], [place] [address] [place],  [address]          Radiology Studies: [place] ASCITES (ABDOMEN LIMITED)  Result Date: 02/05/2019 CLINICAL DATA:  Ascites. EXAM: LIMITED ABDOMEN ULTRASOUND FOR ASCITES TECHNIQUE: Limited ultrasound survey for ascites was performed in all four abdominal quadrants. COMPARISON:  January 31, 2019. FINDINGS: Minimal amount of fluid is noted in the left lower quadrant. No other ascites is noted. IMPRESSION: Only minimal ascites is noted. Adequate fluid pocket for paracentesis was not identified. Electronically Signed   By: Marijo Conception M.D.   On: 02/05/2019 14:21        Scheduled Meds: . amoxicillin-clavulanate  1 tablet Oral Q12H  . furosemide  20 mg Oral Daily  . heparin injection (subcutaneous)  5,000 Units Subcutaneous Q8H  . insulin aspart  0-9 Units Subcutaneous Q4H  . insulin glargine  25 Units Subcutaneous Daily  . lactulose  10 g Oral TID  .  lisinopril  10 mg Oral Daily  . mouth rinse  15 mL Mouth Rinse BID  . multivitamin with minerals  1 tablet Oral Daily  . pantoprazole  40 mg Oral BID  . potassium chloride  20 mEq Oral  Daily  . rifaximin  550 mg Oral BID  . sodium chloride flush  3 mL Intravenous Q12H  . thiamine  100 mg Oral Daily   Or  . thiamine  100 mg Intravenous Daily  . zinc sulfate  220 mg Oral Daily   Continuous Infusions: . sodium chloride 250 mL (01/30/19 1420)  . sodium chloride 500 mL (02/04/19 0410)     LOS: 9 days    Time spent: 25 mins.More than 50% of that time was spent in counseling and/or coordination of care.      Shelly Coss, MD [facility] Pager [phone]  If 7PM-7AM, please contact night-coverage [email or web address] Password [email or web address] 02/07/2019, 1:20 PM

## 2019-02-07 NOTE — Plan of Care (Signed)
  Problem: Education: Goal: Knowledge of General Education information will improve Description: Including pain rating scale, medication(s)/side effects and non-pharmacologic comfort measures Outcome: Progressing   Problem: Health Behavior/Discharge Planning: Goal: Ability to manage health-related needs will improve Outcome: Progressing   Problem: Clinical Measurements: Goal: Ability to maintain clinical measurements within normal limits will improve Outcome: Progressing Goal: Will remain free from infection Outcome: Progressing Goal: Diagnostic test results will improve Outcome: Progressing Goal: Respiratory complications will improve Outcome: Progressing Goal: Cardiovascular complication will be avoided Outcome: Progressing   Problem: Elimination: Goal: Will not experience complications related to urinary retention Outcome: Progressing   Problem: Pain Managment: Goal: General experience of comfort will improve Outcome: Progressing   Problem: Safety: Goal: Ability to remain free from injury will improve Outcome: Progressing   Problem: Skin Integrity: Goal: Risk for impaired skin integrity will decrease Outcome: Progressing   

## 2019-02-07 NOTE — Care Management Important Message (Signed)
Important Message  Patient Details IM Letter given to Nancy Marus RN Case Manager to present to the Patient Name: APURVA BLAYNEY MRN: [id number] Date of Birth: August 04, 1945   Medicare Important Message Given:  Yes     Kerin Salen 02/07/2019, 1:00 PM

## 2019-02-07 NOTE — TOC Initial Note (Signed)
Transition of Care [facility]) - Initial/Assessment Note    Patient Details  Name: Patrick Kidd MRN: [id number] Date of Birth: December 31, 1945  Transition of Care [facility]) CM/SW Contact:    Patrick Courts, RN Phone Number: 02/07/2019, 2:03 PM  Clinical Narrative:                 CM spoke with patient and spouse regarding dc planning. An extensive conversation was held regarding dc options, including recommendations for SNF.  Spouse was provided with information regarding SNF placement process and also that is none of the SNF bed offers are a good option for patient/spouse then patient can still return home with home health services (patient requests wheelchair should he return home). Patient and spouse are both agreeable for CM to initiate search for SNF.  FL2 was faxed out and will await bed offers.  Of note last Covid test is from 01/03/19. CM notified MD that an updated Covid test will be needed to facilitate SNF placement.   Expected Discharge Plan: Skilled Nursing Facility Barriers to Discharge: Continued Medical Work up, No SNF bed   Patient Goals and CMS Choice Patient states their goals for this hospitalization and ongoing recovery are:: to go to a rehab facility CMS Medicare.gov Compare Post Acute Care list provided to:: Patient Choice offered to / list presented to : Patient, Spouse  Expected Discharge Plan and Services Expected Discharge Plan: [place]   Discharge Planning Services: CM Consult Post Acute Care Choice: [place] Living arrangements for the past 2 months: Single Family Home                 DME Arranged: N/A DME Agency: NA       HH Arranged: NA HH Agency: NA        Prior Living Arrangements/Services Living arrangements for the past 2 months: Single Family Home Lives with:: Spouse, Adult Children Patient language and need for interpreter reviewed:: Yes Do you feel safe going back to the place where you live?: Yes       Need for Family Participation in Patient Care: Yes (Comment) Care giver support system in place?: Yes (comment)   Criminal Activity/Legal Involvement Pertinent to Current Situation/Hospitalization: No - Comment as needed  Activities of Daily Living Home Assistive Devices/Equipment: Shower chair with back, Environmental consultant (specify type), Cane (specify quad or straight) ADL Screening (condition at time of admission) Patient's cognitive ability adequate to safely complete daily activities?: Yes Is the patient deaf or have difficulty hearing?: No Does the patient have difficulty seeing, even when wearing glasses/contacts?: No Does the patient have difficulty concentrating, remembering, or making decisions?: Yes Patient able to express need for assistance with ADLs?: Yes Does the patient have difficulty dressing or bathing?: Yes Independently performs ADLs?: Yes (appropriate for developmental age)(at baseline) Does the patient have difficulty walking or climbing stairs?: Yes Weakness of Legs: Both Weakness of Arms/Hands: Both  Permission Sought/Granted   Permission granted to share information with : Yes, Verbal Permission Granted     Permission granted to share info w AGENCY: area SNF        Emotional Assessment   Attitude/Demeanor/Rapport: Engaged Affect (typically observed): Accepting Orientation: : Oriented to Self, Oriented to Place, Oriented to  Time   Psych Involvement: No (comment)  Admission diagnosis:  Jaundice [R17] Abnormal liver function [R94.5] Patient Active Problem List   Diagnosis Date Noted  . WPW (Wolff-Parkinson-White syndrome) 02/05/2019  . SBP (spontaneous bacterial peritonitis) ([place])   .  Decompensated hepatic cirrhosis ([place])   . Ascites   . Jaundice   . Leucocytosis   . Abnormal liver function 01/29/2019  . Acute cholecystitis 01/29/2019  . Transaminitis 01/29/2019  . COVID-19 virus infection 01/29/2019  . Weakness 11/22/2018  . Degenerative disc disease,  cervical 07/21/2018  . Cervical radiculopathy 07/16/2018  . Mastoiditis, acute, right 07/06/2018  . OAB (overactive bladder) 07/06/2018  . Malignant neoplasm of prostate ([place]) 01/13/2018  . 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 Hyperlipidemia LDL goal <100 11/11/2017  . Essential hypertension 11/11/2017  . BPH associated with nocturia 11/11/2017  . Erectile dysfunction due to arterial insufficiency 04/07/2017  . Medicare annual wellness visit, subsequent 10/02/2016  . Peripheral vascular disease ([place]) 06/25/2016  . Alcoholic cirrhosis ([place]) [id number]  . Liver disease 10/04/2015  . Type 2 diabetes mellitus with complication, with long-term current use of insulin ([place]) 08/22/2015  . Thrombocytopenia ([place]) 03/19/2014  . Hypersomnolence 03/19/2014  . Severe obesity with body mass index (BMI) of 35.0 to 39.9 with comorbidity ([place]) 07/19/2013  . Peripheral neuropathy 01/11/2013  . Polyclonal gammopathy 01/11/2013  . GERD 10/04/2009   PCP:  Patrick Lima, MD Pharmacy:   [facility] [place], [place] [address] Phone: [phone] Fax: [phone]  [place], [place] [address] Phone: [phone] Fax: [phone]  [place], [place] [address] Phone: [phone] Fax: [phone]     Social Determinants of Health (SDOH) Interventions    Readmission Risk Interventions No flowsheet data found.

## 2019-02-07 NOTE — Progress Notes (Signed)
Nutrition Follow-up  DOCUMENTATION CODES:   Not applicable  INTERVENTION:  - will order Boost Breeze once/day, each supplement provides 250 kcal and 9 grams of protein. - will order Ensure Enlive BID, each supplement provides 350 kcal and 20 grams of protein. - will order 30 mL Prostat BID, each supplement provides 100 kcal and 15 grams of protein. - continue to encourage PO intakes.    NUTRITION DIAGNOSIS:   Increased nutrient needs related to acute illness as evidenced by estimated needs. -ongoing  GOAL:   Patient will meet greater than or equal to 90% of their needs -unmet  MONITOR:   PO intake, Supplement acceptance, Labs, Weight trends  ASSESSMENT:   74 year old male with past medical history of type 2 DM, alcoholic cirrhosis, portal HTN, PUD, BPH, and recent Covid-19 infection (01/03/2019) that was treated with steroids. He presented to the ED with R-sided epigastric pain, generalized weakness, fatigue, and yellowed skin. He has been drinking about sixteen 8-oz vodka drinks/day. He denied N/V/D/ or hematochezia. In the ED has was noted to have transaminitis. CT abd/pelvis showed mildly distended gallbladder with surrounding inflammatory changes, small amount of pericholecystic fluid suggesting cirrhosis and possible underlying cholecystitis.  Diet was advanced from CLD to [place] on 1/1 and to Soft on 1/2. Since diet advancement, patient has been eating very little, mainly bites or skipping meals. He denies any abdominal pain/discomfort at rest or with PO intakes.   Per chart review, weight has been mainly stable throughout hospitalization.   Per notes: - decompensated alcohol cirrhosis--repeat paracentesis not feasible as not enough fluid - abdominal pain 2/2 SBP and cholecystitis--not a surgical candidate; s/p MRCP - medically stable pending bed at SNF when available   Labs reviewed; CBGs: 139, 132, 147, and 263 mg/dl, Ca: 8.5 mg/dl, Alk Phos elevated. Medications  reviewed; 20 mg oral lasix/day, sliding scale novolog, 25 units lantus/day, 10 g lactulose TID, daily multivitamin with minerals, 40 mg oral protonix BID, 20 mEq Klor-Con/day, 100 mg oral thiamine/day, 220 mg zinc sulfate/day.     Diet Order:   Diet Order            DIET SOFT Room service appropriate? Yes; Fluid consistency: Thin  Diet effective now              EDUCATION NEEDS:   No education needs have been identified at this time  Skin:  Skin Assessment: Reviewed RN Assessment  Last BM:  1/4  Height:   Ht Readings from Last 1 Encounters:  01/28/19 6' 2"  (1.88 m)    Weight:   Wt Readings from Last 1 Encounters:  02/07/19 110.7 kg    Ideal Body Weight:  86.4 kg  BMI:  Body mass index is 31.34 kg/m.  Estimated Nutritional Needs:   Kcal:  2000-2200 kcal  Protein:  105-115 grams  Fluid:  >/= 2 L/day     Jarome Matin, MS, RD, LDN, [facility] Inpatient Clinical Dietitian Pager # [phone] After hours/weekend pager # [phone]

## 2019-02-08 ENCOUNTER — Telehealth: Payer: Self-pay

## 2019-02-08 LAB — CBC WITH DIFFERENTIAL/PLATELET
Abs Immature Granulocytes: 0.19 10*3/uL — ABNORMAL HIGH (ref 0.00–0.07)
Basophils Absolute: 0.1 10*3/uL (ref 0.0–0.1)
Basophils Relative: 0 %
Eosinophils Absolute: 0 10*3/uL (ref 0.0–0.5)
Eosinophils Relative: 0 %
HCT: 31.4 % — ABNORMAL LOW (ref 39.0–52.0)
Hemoglobin: 10.2 g/dL — ABNORMAL LOW (ref 13.0–17.0)
Immature Granulocytes: 1 %
Lymphocytes Relative: 9 %
Lymphs Abs: 1.4 10*3/uL (ref 0.7–4.0)
MCH: 33.4 pg (ref 26.0–34.0)
MCHC: 32.5 g/dL (ref 30.0–36.0)
MCV: 103 fL — ABNORMAL HIGH (ref 80.0–100.0)
Monocytes Absolute: 1.2 10*3/uL — ABNORMAL HIGH (ref 0.1–1.0)
Monocytes Relative: 8 %
Neutro Abs: 12.5 10*3/uL — ABNORMAL HIGH (ref 1.7–7.7)
Neutrophils Relative %: 82 %
Platelets: 143 10*3/uL — ABNORMAL LOW (ref 150–400)
RBC: 3.05 MIL/uL — ABNORMAL LOW (ref 4.22–5.81)
RDW: 15.3 % (ref 11.5–15.5)
WBC: 15.2 10*3/uL — ABNORMAL HIGH (ref 4.0–10.5)
nRBC: 0 % (ref 0.0–0.2)

## 2019-02-08 LAB — GLUCOSE, CAPILLARY
Glucose-Capillary: 163 mg/dL — ABNORMAL HIGH (ref 70–99)
Glucose-Capillary: 186 mg/dL — ABNORMAL HIGH (ref 70–99)
Glucose-Capillary: 190 mg/dL — ABNORMAL HIGH (ref 70–99)
Glucose-Capillary: 192 mg/dL — ABNORMAL HIGH (ref 70–99)
Glucose-Capillary: 201 mg/dL — ABNORMAL HIGH (ref 70–99)
Glucose-Capillary: 206 mg/dL — ABNORMAL HIGH (ref 70–99)

## 2019-02-08 MED ORDER — SODIUM CHLORIDE 0.9 % IV BOLUS
500.0000 mL | Freq: Once | INTRAVENOUS | Status: AC
  Administered 2019-02-08: 500 mL via INTRAVENOUS

## 2019-02-08 MED ORDER — MENTHOL 3 MG MT LOZG
1.0000 | LOZENGE | OROMUCOSAL | Status: DC | PRN
  Administered 2019-02-08: 15:00:00 3 mg via ORAL
  Filled 2019-02-08: qty 9

## 2019-02-08 NOTE — Telephone Encounter (Signed)
Attempted to reach the patient. VM is full.  I will continue to try and reach him

## 2019-02-08 NOTE — Progress Notes (Signed)
PROGRESS NOTE    Patrick Kidd  [id number] DOB: [birth year] DOA: 01/28/2019 PCP: [name], MD   Brief Narrative:  Patient is a 74 year old male with history of type 2 diabetes mellitus, alcoholic cirrhosis, portal hypertension, PVD, BPH, recent Covid-19  infection presented to the emergency department with right-sided epigastric abdominal pain.  He also reported generalized weakness, fatigue, jaundice.  He drinks about sixteen 8 ounce drinks vodka a day.  In the emergency department, he was found to have elevated liver enzymes.  CT abdomen/pelvis showed mild distended gallbladder with surrounding inflammatory changes, small pericholecystic fluid suggesting  possible cholecystitis.  General surgery was consulted and he is not a candidate for any kind of surgical procedure.  GI and PCCM were also following him.   Underwent paracentesis and fluid analysis was suggestive of SBP.  Started on antibiotics. Mental status has significantly improved. Off precedex. Oral diet has been started.  Physical therapy /OT recommended skilled nursing  facility.  He is hemodynamically stable for skilled nursing facility as soon as bed is available.   Assessment & Plan:   Principal Problem:   Acute cholecystitis Active Problems:   GERD   Type 2 diabetes mellitus with complication, with long-term current use of insulin (HCC)   Essential hypertension   Transaminitis   COVID-19 virus infection   Ascites   Jaundice   Leucocytosis   SBP (spontaneous bacterial peritonitis) (HCC)   Decompensated hepatic cirrhosis (HCC)   WPW (Wolff-Parkinson-White syndrome)   Abdominal pain secondary to SBP/possible cholecystitis: CT abdomen/pelvis showed possible acute cholecystitis.  Right upper quadrant ultrasound showed gallstones, mild gallbladder wall thickening.general surgery was consulted and due to his child's class C cirrhosis he is not a candidate for surgical intervention.  General surgery thought that  this is less likely a cholecystitis. Underwent diagnostic paracentesis by IR and fluid analysis showed SBP.  MRCP did not show any bile duct dilation, no choledocholithiasis, showed asymmetric gallbladder thickening, cirrhosis. Peritoneal fluid culture has not shown any growth.  Blood cultures negative so far.  He was on Unasyn,changed to Augmentin which will be continued for total of 2 weeks  and then he will be on ciprofloxacin 500 mg daily for SBP prophylaxis. He does not complain of any abdominal pain today.  Decompensated alcohol cirrhosis/portal hypertension: CT abdomen showed hepatomegaly, cirrhosis, perihepatic ascites.  MELD score  of 28 on admission.  Mild elevated liver enzymes.  GI following.  On lactulose 3 times a day.  Also on rifaximin, and zinc.  Repeat paracentesis was attempted but he does not have enough fluid on his abdomen  WPW syndrome:There was concern for SVT/A. fib with RVR during this hospitalization, He actually has history of WPW syndrome.  Cardiology concluded that he does not have A. fib.  EKG showed frequent PACs.  Recommended to avoid beta-blockers, calcium.    Cardiology will arrange outpatient EP follow-up.  Alcohol abuse disorder with withdrawals: On 12/28, he became more confused, agitated, tachycardic exhibiting signs of alcohol withdrawal symptoms.  PCCM was following.  He was started on Precedex.  Off Precedex now.  Continue thiamine, folic acid  GERD: Continue PPI  Type 2 diabetes mellitus: Hemoglobin A1c of 8.9 as per 12/20.  On Metformin as an outpatient.  Continue Lantus and sliding scale.  Hypertension: BP better.  On lisinopril  Hyponatremia: Much improved  Recent Covid-19 infection: Diagnosed on 01/03/2019, treated with steroids.  Currently respiratory status stable.  Not on airborne or contact isolation.  Weakness/debility: PT/OT recommended skilled  nursing facility.  Leukocytosis: Likely reactive.Trending down  Morbid obesity: BMI of  31.2.     Nutrition Problem: Increased nutrient needs Etiology: acute illness      DVT prophylaxis: Heparin [place] Code Status: Full Family Communication: Called wife on phone today Disposition Plan: SNF .  Hemodynamically stable for discharge to skilled nursing facility as soon as bed is available.   Consultants: None  Procedures: None  Antimicrobials:  Anti-infectives (From admission, onward)   Start     Dose/Rate Route Frequency Ordered Stop   02/04/19 1000  amoxicillin-clavulanate (AUGMENTIN) 875-125 MG per tablet 1 tablet     1 tablet Oral Every 12 hours 02/04/19 0858     02/02/19 1000  rifaximin (XIFAXAN) tablet 550 mg     550 mg Oral 2 times daily 02/02/19 0931     02/01/19 2200  rifaximin (XIFAXAN) tablet 200 mg  Status:  Discontinued     200 mg Oral 2 times daily 02/01/19 1550 02/02/19 0931   02/01/19 1800  Ampicillin-Sulbactam (UNASYN) 3 g in sodium chloride 0.9 % 100 mL IVPB  Status:  Discontinued     3 g 200 mL/hr over 30 Minutes Intravenous Every 6 hours 02/01/19 1159 02/04/19 0858   02/01/19 1600  metroNIDAZOLE (FLAGYL) IVPB 500 mg  Status:  Discontinued     500 mg 100 mL/hr over 60 Minutes Intravenous Every 8 hours 02/01/19 1550 02/01/19 1610   02/01/19 1000  amoxicillin-clavulanate (AUGMENTIN) 875-125 MG per tablet 1 tablet  Status:  Discontinued     1 tablet Oral Every 12 hours 02/01/19 0535 02/01/19 0930   02/01/19 1000  piperacillin-tazobactam (ZOSYN) IVPB 3.375 g  Status:  Discontinued     3.375 g 12.5 mL/hr over 240 Minutes Intravenous Every 8 hours 02/01/19 0935 02/01/19 1159   02/01/19 0530  cefTRIAXone (ROCEPHIN) 2 g in sodium chloride 0.9 % 100 mL IVPB  Status:  Discontinued     2 g 200 mL/hr over 30 Minutes Intravenous Every 24 hours 02/01/19 0532 02/01/19 0533   01/31/19 2200  amoxicillin-clavulanate (AUGMENTIN) 875-125 MG per tablet 1 tablet  Status:  Discontinued     1 tablet Oral Every 12 hours 01/31/19 1753 02/01/19 0532   01/29/19 0700   piperacillin-tazobactam (ZOSYN) IVPB 3.375 g  Status:  Discontinued     3.375 g 12.5 mL/hr over 240 Minutes Intravenous Every 8 hours 01/29/19 0403 01/31/19 1753   01/29/19 0030  piperacillin-tazobactam (ZOSYN) IVPB 3.375 g     3.375 g 100 mL/hr over 30 Minutes Intravenous  Once 01/29/19 0019 01/29/19 0123      Subjective: Patient seen and examined the bedside this morning.  Hemodynamically stable.  No active issues.  Stable for discharge as soon as the bed is available  Objective: Vitals:   02/07/19 2042 02/08/19 0438 02/08/19 0738 02/08/19 0900  BP: (!) 151/57 (!) 165/83    Pulse: (!) 106 99 99   Resp: 20 18    Temp: 99.3 F (37.4 C) 100.1 F (37.8 C)  99.1 F (37.3 C)  TempSrc: Oral Oral  Oral  SpO2: 95% 95%    Weight:  109.6 kg    Height:        Intake/Output Summary (Last 24 hours) at 02/08/2019 1301 Last data filed at 02/08/2019 0900 Gross per 24 hour  Intake 300 ml  Output 425 ml  Net -125 ml   Filed Weights   02/02/19 0500 02/07/19 0430 02/08/19 0438  Weight: 110.5 kg 110.7 kg 109.6 kg  Examination:  General exam: Comfortable HEENT: Ear/Nose normal on gross exam Respiratory system: Bilateral equal air entry, normal vesicular breath sounds, no wheezes or crackles  Cardiovascular system: Regular rhythm. No JVD, murmurs, rubs, gallops or clicks. No pedal edema. Gastrointestinal system: Abdomen is distended, soft and nontender. No organomegaly or masses felt. Normal bowel sounds heard. Central nervous system:Not  Alert and oriented. No focal neurological deficits. Extremities: No edema, no clubbing ,no cyanosis, distal peripheral pulses palpable. Skin:No rashes, lesions or ulcers,no pallor    Data Reviewed: I have personally reviewed following labs and imaging studies  CBC: Recent Labs  Lab 02/03/19 0203 02/04/19 0217 02/06/19 0546 02/07/19 0521 02/08/19 0603  WBC 15.7* 12.0* 16.0* 17.0* 15.2*  NEUTROABS  --   --  12.7* 13.6* 12.5*  HGB 11.3* 11.1*  11.2* 9.7* 10.2*  HCT 33.8* 32.8* 34.3* 29.6* 31.4*  MCV 100.6* 100.3* 101.8* 102.1* 103.0*  PLT 117* 122* 154 148* [id number]*   Basic Metabolic Panel: Recent Labs  Lab 02/02/19 0133 02/03/19 0203 02/04/19 0217 02/04/19 2222 02/05/19 0151 02/06/19 0546  NA 135 137 141 140 140 138  K 3.6 3.5 3.2* 3.6 3.8 3.7  CL 98 99 105 103 103 101  CO2 27 26 26 26 25 25   GLUCOSE 142* 164* 130* 232* 192* 225*  BUN 29* 35* 31* 29* 27* 20  CREATININE 0.88 0.71 0.75 0.97 0.82 0.78  CALCIUM 8.0* 7.9* 7.9* 8.2* 8.3* 8.5*  MG 2.0 2.2 2.1 2.1  --   --    GFR: Estimated Creatinine Clearance: 106.8 mL/min (by C-G formula based on SCr of 0.78 mg/dL). Liver Function Tests: Recent Labs  Lab 02/02/19 0133 02/03/19 0203 02/04/19 0217 02/05/19 0151 02/06/19 0546  AST 37 47* 43* 53* 32  ALT 24 25 25 30 25   ALKPHOS 228* 218* 216* 259* 242*  BILITOT 10.5* 10.0* 6.8* 6.4* 5.8*  PROT 5.5* 5.6* 5.5* 6.2* 6.4*  ALBUMIN 2.6* 2.7* 2.5* 2.5* 2.7*   No results for input(s): LIPASE, AMYLASE in the last 168 hours. Recent Labs  Lab 02/04/19 0217 02/05/19 0151 02/06/19 0546  AMMONIA 77* 35 26   Coagulation Profile: Recent Labs  Lab 02/02/19 0133 02/03/19 0203 02/04/19 0217  INR 1.2 1.2 1.2   Cardiac Enzymes: No results for input(s): CKTOTAL, CKMB, CKMBINDEX, TROPONINI in the last 168 hours. BNP (last 3 results) No results for input(s): PROBNP in the last 8760 hours. HbA1C: No results for input(s): HGBA1C in the last 72 hours. CBG: Recent Labs  Lab 02/07/19 2045 02/08/19 0032 02/08/19 0432 02/08/19 0740 02/08/19 1137  GLUCAP 192* 192* 190* 201* 186*   Lipid Profile: No results for input(s): CHOL, HDL, LDLCALC, TRIG, CHOLHDL, LDLDIRECT in the last 72 hours. Thyroid Function Tests: No results for input(s): TSH, T4TOTAL, FREET4, T3FREE, THYROIDAB in the last 72 hours. Anemia Panel: No results for input(s): VITAMINB12, FOLATE, FERRITIN, TIBC, IRON, RETICCTPCT in the last 72 hours. Sepsis  Labs: No results for input(s): PROCALCITON, LATICACIDVEN in the last 168 hours.  Recent Results (from the past 240 hour(s))  Culture, blood (routine x 2)     Status: None   Collection Time: 01/30/19  3:32 AM   Specimen: BLOOD  Result Value Ref Range Status   Specimen Description   Final    BLOOD LEFT HAND Performed at [place] [address], [place], [place] [address]    Special Requests   Final    BOTTLES DRAWN AEROBIC ONLY Blood Culture adequate volume Performed at [facility], [place] [address], [place], [place] [address]    Culture   Final    NO GROWTH 5 DAYS Performed at [facility], [place] [address], [place], [place] [address]    Report Status 02/04/2019 FINAL  Final  Aerobic/Anaerobic Culture (surgical/deep wound)     Status: None   Collection Time: 01/31/19  3:25 PM   Specimen: Abdomen; Peritoneal Fluid  Result Value Ref Range Status   Specimen Description   Final    PERITONEAL Performed at [place] [address] [place], [place] [address]    Special Requests   Final    NONE Performed at [facility], [place] [address] [place], [place] [address]    Gram Stain   Final    ABUNDANT WBC PRESENT, PREDOMINANTLY PMN NO ORGANISMS SEEN    Culture   Final    No growth aerobically or anaerobically. Performed at [facility], [place] [address], [place], [place] [address]    Report Status 02/05/2019 FINAL  Final  MRSA PCR Screening     Status: None   Collection Time: 02/01/19  5:18 PM   Specimen: Nasal Mucosa; Nasopharyngeal  Result Value Ref Range Status   MRSA by PCR NEGATIVE NEGATIVE Final    Comment:        The GeneXpert MRSA Assay (FDA approved for NASAL specimens only), is one component of a comprehensive MRSA colonization surveillance program. It is not intended to diagnose MRSA infection nor to guide or monitor treatment for MRSA infections. Performed at [facility], [place] [address], [place], [place] [address]   SARS CORONAVIRUS 2 (TAT 6-24 HRS) Nasopharyngeal Nasopharyngeal Swab     Status: None   Collection Time: 02/07/19  4:49 PM   Specimen: Nasopharyngeal Swab  Result Value Ref Range Status   SARS Coronavirus 2 NEGATIVE NEGATIVE Final    Comment: (NOTE) SARS-CoV-2 target nucleic acids are NOT DETECTED. The SARS-CoV-2 RNA is generally detectable in upper and lower respiratory specimens during the acute phase of infection. Negative results do not preclude SARS-CoV-2 infection, do not rule out co-infections with other pathogens, and should not be used as the sole basis for treatment or other patient management decisions. Negative results must be combined with clinical observations, patient history, and epidemiological information. The expected result is Negative. Fact Sheet for Patients: SugarRoll.be Fact Sheet for Healthcare Providers: https://www.woods-mathews.com/ This test is not yet approved or cleared by the [place] FDA and  has been authorized for detection and/or diagnosis of SARS-CoV-2 by FDA under an Emergency Use Authorization (EUA). This EUA will remain  in effect (meaning this test can be used) for the duration of the COVID-19 declaration under Section 56 4(b)(1) of the Act, 21 U.S.C. section 360bbb-3(b)(1), unless the authorization is terminated or revoked sooner. Performed at [facility], [place] [address], [place],  [address]          Radiology Studies: No results found.      Scheduled Meds: . amoxicillin-clavulanate  1 tablet Oral Q12H  . feeding supplement  1 Container Oral Q24H  . feeding supplement (ENSURE ENLIVE)  237 mL Oral BID BM  . feeding supplement (PRO-STAT SUGAR FREE 64)  30 mL Oral BID  . furosemide  20 mg Oral Daily  . heparin injection (subcutaneous)  5,000 Units Subcutaneous Q8H  . insulin aspart  0-9 Units Subcutaneous  Q4H  . insulin glargine  25 Units Subcutaneous Daily  .  lactulose  10 g Oral TID  . lisinopril  10 mg Oral Daily  . mouth rinse  15 mL Mouth Rinse BID  . multivitamin with minerals  1 tablet Oral Daily  . pantoprazole  40 mg Oral BID  . potassium chloride  20 mEq Oral Daily  . rifaximin  550 mg Oral BID  . sodium chloride flush  3 mL Intravenous Q12H  . thiamine  100 mg Oral Daily   Or  . thiamine  100 mg Intravenous Daily  . zinc sulfate  220 mg Oral Daily   Continuous Infusions: . sodium chloride 250 mL (01/30/19 1420)  . sodium chloride 500 mL (02/04/19 0410)     LOS: 10 days    Time spent: 25 mins.More than 50% of that time was spent in counseling and/or coordination of care.      Shelly Coss, MD [facility] Pager [phone]  If 7PM-7AM, please contact night-coverage [email or web address] Password [email or web address] 02/08/2019, 1:01 PM

## 2019-02-08 NOTE — Progress Notes (Signed)
Pt insisting that rectal tube be taken out. States that it is uncomfortable and he does not feel like he needs it anymore. Moderate amount of brown stool noted in tubing and bag of rectal tube. NP on call, Baltazar Najjar, paged and order for rectal tube d/c'd. Rectal tube taken out per pt request.

## 2019-02-08 NOTE — Telephone Encounter (Signed)
-----   Message from Mauri Pole, MD sent at 02/06/2019 10:13 AM EST ----- Hospitalized with Decompensated cirrhosis and SBP, plan for discharge from hospital either later today or tomorrow.  Please schedule office follow up visit with Dr [name] or extender in 3-4 weeks. Also will need EGD and colonoscopy, can be scheduled when he comes for office visit. Thanks VN

## 2019-02-08 NOTE — TOC Progression Note (Signed)
Transition of Care [facility]) - Progression Note    Patient Details  Name: BENJIMIN STROUGH MRN: [id number] Date of Birth: Jul 12, 1945  Transition of Care [facility]) CM/SW Contact  Kyreese Chio, Juliann Pulse, RN Phone Number: 02/08/2019, 3:39 PM  Clinical Narrative: spouse chose [facility] rep [facility] aware & following. D/c in am-MD aware.      Expected Discharge Plan: [place] Barriers to Discharge: Barriers Resolved  Expected Discharge Plan and Services Expected Discharge Plan: [place]   Discharge Planning Services: CM Consult Post Acute Care Choice: [place] Living arrangements for the past 2 months: Single Family Home                 DME Arranged: N/A DME Agency: NA       HH Arranged: NA HH Agency: NA         Social Determinants of Health (SDOH) Interventions    Readmission Risk Interventions No flowsheet data found.

## 2019-02-08 NOTE — TOC Progression Note (Signed)
Transition of Care [facility]) - Progression Note    Patient Details  Name: Patrick Kidd MRN: [id number] Date of Birth: 1945/02/27  [facility]) CM/SW Contact  Shalie Schremp, Juliann Pulse, RN Phone Number: 02/08/2019, 12:25 PM  Clinical Narrative:  Awaiting SNF choice from bed offers from spouse.     Expected Discharge Plan: Skilled Nursing Facility Barriers to Discharge: Continued Medical Work up(awaiting choice of bed offers  from spouse)  Expected Discharge Plan and Services Expected Discharge Plan: [place]   Discharge Planning Services: CM Consult Post Acute Care Choice: [place] Living arrangements for the past 2 months: Single Family Home                 DME Arranged: N/A DME Agency: NA       HH Arranged: NA HH Agency: NA         Social Determinants of Health (SDOH) Interventions    Readmission Risk Interventions No flowsheet data found.

## 2019-02-08 NOTE — Plan of Care (Signed)
  Problem: Education: Goal: Knowledge of General Education information will improve Description: Including pain rating scale, medication(s)/side effects and non-pharmacologic comfort measures Outcome: Completed/Met   Problem: Health Behavior/Discharge Planning: Goal: Ability to manage health-related needs will improve Outcome: Progressing   Problem: Clinical Measurements: Goal: Ability to maintain clinical measurements within normal limits will improve Outcome: Progressing Goal: Will remain free from infection Outcome: Progressing Goal: Diagnostic test results will improve Outcome: Completed/Met Goal: Respiratory complications will improve Outcome: Completed/Met Goal: Cardiovascular complication will be avoided Outcome: Completed/Met   Problem: Activity: Goal: Risk for activity intolerance will decrease Outcome: Adequate for Discharge   Problem: Nutrition: Goal: Adequate nutrition will be maintained Outcome: Adequate for Discharge   Problem: Coping: Goal: Level of anxiety will decrease Outcome: Completed/Met   Problem: Elimination: Goal: Will not experience complications related to bowel motility Outcome: Completed/Met Goal: Will not experience complications related to urinary retention Outcome: Completed/Met   Problem: Pain Managment: Goal: General experience of comfort will improve Outcome: Completed/Met   Problem: Safety: Goal: Ability to remain free from injury will improve Outcome: Completed/Met   Problem: Skin Integrity: Goal: Risk for impaired skin integrity will decrease Outcome: Completed/Met

## 2019-02-09 ENCOUNTER — Telehealth: Payer: Self-pay | Admitting: *Deleted

## 2019-02-09 DIAGNOSIS — Z23 Encounter for immunization: Secondary | ICD-10-CM | POA: Diagnosis not present

## 2019-02-09 DIAGNOSIS — K810 Acute cholecystitis: Secondary | ICD-10-CM | POA: Diagnosis not present

## 2019-02-09 DIAGNOSIS — Z7401 Bed confinement status: Secondary | ICD-10-CM | POA: Diagnosis not present

## 2019-02-09 DIAGNOSIS — K766 Portal hypertension: Secondary | ICD-10-CM | POA: Diagnosis not present

## 2019-02-09 DIAGNOSIS — I499 Cardiac arrhythmia, unspecified: Secondary | ICD-10-CM | POA: Diagnosis not present

## 2019-02-09 DIAGNOSIS — K219 Gastro-esophageal reflux disease without esophagitis: Secondary | ICD-10-CM | POA: Diagnosis not present

## 2019-02-09 DIAGNOSIS — E118 Type 2 diabetes mellitus with unspecified complications: Secondary | ICD-10-CM | POA: Diagnosis not present

## 2019-02-09 DIAGNOSIS — R17 Unspecified jaundice: Secondary | ICD-10-CM | POA: Diagnosis not present

## 2019-02-09 DIAGNOSIS — Z8616 Personal history of COVID-19: Secondary | ICD-10-CM | POA: Diagnosis not present

## 2019-02-09 DIAGNOSIS — M2550 Pain in unspecified joint: Secondary | ICD-10-CM | POA: Diagnosis not present

## 2019-02-09 DIAGNOSIS — E1140 Type 2 diabetes mellitus with diabetic neuropathy, unspecified: Secondary | ICD-10-CM | POA: Diagnosis not present

## 2019-02-09 DIAGNOSIS — R05 Cough: Secondary | ICD-10-CM | POA: Diagnosis not present

## 2019-02-09 DIAGNOSIS — I456 Pre-excitation syndrome: Secondary | ICD-10-CM | POA: Diagnosis not present

## 2019-02-09 DIAGNOSIS — K7460 Unspecified cirrhosis of liver: Secondary | ICD-10-CM | POA: Diagnosis not present

## 2019-02-09 DIAGNOSIS — I4891 Unspecified atrial fibrillation: Secondary | ICD-10-CM | POA: Diagnosis not present

## 2019-02-09 DIAGNOSIS — Z20828 Contact with and (suspected) exposure to other viral communicable diseases: Secondary | ICD-10-CM | POA: Diagnosis not present

## 2019-02-09 DIAGNOSIS — R188 Other ascites: Secondary | ICD-10-CM | POA: Diagnosis not present

## 2019-02-09 DIAGNOSIS — M6281 Muscle weakness (generalized): Secondary | ICD-10-CM | POA: Diagnosis not present

## 2019-02-09 DIAGNOSIS — K7290 Hepatic failure, unspecified without coma: Secondary | ICD-10-CM | POA: Diagnosis not present

## 2019-02-09 DIAGNOSIS — I10 Essential (primary) hypertension: Secondary | ICD-10-CM | POA: Diagnosis not present

## 2019-02-09 LAB — GLUCOSE, CAPILLARY
Glucose-Capillary: 173 mg/dL — ABNORMAL HIGH (ref 70–99)
Glucose-Capillary: 187 mg/dL — ABNORMAL HIGH (ref 70–99)
Glucose-Capillary: 192 mg/dL — ABNORMAL HIGH (ref 70–99)
Glucose-Capillary: 207 mg/dL — ABNORMAL HIGH (ref 70–99)
Glucose-Capillary: 245 mg/dL — ABNORMAL HIGH (ref 70–99)

## 2019-02-09 MED ORDER — METFORMIN HCL ER (MOD) 1000 MG PO TB24: 1000.0000 mg | ORAL_TABLET | Freq: Every day | ORAL | Status: DC

## 2019-02-09 MED ORDER — FOLIC ACID 1 MG PO TABS: 1.0000 mg | ORAL_TABLET | Freq: Every day | ORAL | 3 refills | Status: DC

## 2019-02-09 MED ORDER — LACTULOSE 10 GM/15ML PO SOLN: 10.0000 g | Freq: Two times a day (BID) | ORAL | 0 refills | Status: DC

## 2019-02-09 MED ORDER — RIFAXIMIN 550 MG PO TABS: 550.0000 mg | ORAL_TABLET | Freq: Two times a day (BID) | ORAL | Status: DC

## 2019-02-09 MED ORDER — GUAIFENESIN-DM 100-10 MG/5ML PO SYRP
5.0000 mL | ORAL_SOLUTION | ORAL | Status: DC | PRN
  Administered 2019-02-09: 5 mL via ORAL
  Filled 2019-02-09: qty 10

## 2019-02-09 MED ORDER — POTASSIUM CHLORIDE CRYS ER 20 MEQ PO TBCR: 20.0000 meq | EXTENDED_RELEASE_TABLET | Freq: Every day | ORAL | Status: DC

## 2019-02-09 MED ORDER — AMOXICILLIN-POT CLAVULANATE 875-125 MG PO TABS: 1.0000 | ORAL_TABLET | Freq: Two times a day (BID) | ORAL | Status: AC

## 2019-02-09 MED ORDER — CIPROFLOXACIN HCL 500 MG PO TABS: 500.0000 mg | ORAL_TABLET | Freq: Every day | ORAL | 0 refills | Status: DC

## 2019-02-09 MED ORDER — FUROSEMIDE 20 MG PO TABS: 20.0000 mg | ORAL_TABLET | Freq: Every day | ORAL | Status: DC

## 2019-02-09 MED ORDER — BENZONATATE 200 MG PO CAPS: 200.0000 mg | ORAL_CAPSULE | Freq: Three times a day (TID) | ORAL | 0 refills | Status: DC | PRN

## 2019-02-09 MED ORDER — ADULT MULTIVITAMIN W/MINERALS CH: 1.0000 | ORAL_TABLET | Freq: Every day | ORAL | Status: DC

## 2019-02-09 MED ORDER — THIAMINE HCL 100 MG PO TABS: 100.0000 mg | ORAL_TABLET | Freq: Every day | ORAL | Status: DC

## 2019-02-09 MED ORDER — LISINOPRIL 10 MG PO TABS: 10.0000 mg | ORAL_TABLET | Freq: Every day | ORAL | Status: DC

## 2019-02-09 NOTE — TOC Transition Note (Signed)
Transition of Care [facility]) - CM/SW Discharge Note   Patient Details  Name: Patrick Kidd MRN: [id number] Date of Birth: [birth year]  Transition of Care [facility]) CM/SW Contact:  Dessa Phi, RN Phone Number: 02/09/2019, 11:17 AM   Clinical Narrative: d/c today to [facility] rep Juliann Pulse aware-rm#[id number],nurse call report [phone]. PTAR called-Nsg aware. No further CM needs.      Final next level of care: [place] Barriers to Discharge: No Barriers Identified   Patient Goals and CMS Choice Patient states their goals for this hospitalization and ongoing recovery are:: to go to a rehab facility CMS Medicare.gov Compare Post Acute Care list provided to:: Patient Choice offered to / list presented to : Patient, Spouse  Discharge Placement              Patient [facility] bed at: [facility] Patient to be transferred to facility by: [place] Name of family member notified: Tammi Klippel spouse Patient and family notified of of transfer: 02/09/19  Discharge Plan and Services   Discharge Planning Services: CM Consult Post Acute Care Choice: [place]          DME Arranged: N/A DME Agency: NA       HH Arranged: NA HH Agency: NA        Social Determinants of Health (SDOH) Interventions     Readmission Risk Interventions No flowsheet data found.

## 2019-02-09 NOTE — Plan of Care (Signed)
  Problem: Health Behavior/Discharge Planning: Goal: Ability to manage health-related needs will improve Outcome: Completed/Met   Problem: Clinical Measurements: Goal: Ability to maintain clinical measurements within normal limits will improve Outcome: Completed/Met Goal: Will remain free from infection Outcome: Completed/Met   Problem: Activity: Goal: Risk for activity intolerance will decrease Outcome: Completed/Met   Problem: Nutrition: Goal: Adequate nutrition will be maintained Outcome: Completed/Met

## 2019-02-09 NOTE — Progress Notes (Signed)
Paged [facility] to request orders due to Yellow MEWS score (Temp: 100.7, BP: 146/62, and pulse: 103). Yellow MEWS score guidelines implemented. [facility] Tylene Fantasia) put in an order for Normal Saline IV bolus @ 500 ml/hr. Will give IV bolus per order via [facility], medicate for pain, and continue to monitor patient.

## 2019-02-09 NOTE — Progress Notes (Signed)
MEWS score now green.  

## 2019-02-09 NOTE — Progress Notes (Signed)
Pt discharging to [facility] today per DR. Adhikari. Pt's IV site D/C'd and WDL. AVS placed in packet for discharge. Report called to receiving nurse, [place]. Verbalized understanding. PTAR transporting to facility. Wife at bedside and updated and plan of care. Left floor via stretcher in stable condition.

## 2019-02-09 NOTE — Telephone Encounter (Signed)
Pt was on TCM report admitted 01/28/19 for right-sided epigastric abdominal pain. He also reported generalized weakness, fatigue, jaundice. Pt had CT abdomen/pelvis which showed mild distended gallbladder with surrounding inflammatory changes, small pericholecystic fluid suggesting possible cholecystitis. Pt underwent diagnostic paracentesis by IR and fluid analysis showed SBP. MRCP did not show any bile duct dilation, no choledocholithiasis, showed asymmetric gallbladder thickening, cirrhosis. Pt D/C 02/08/18 to SNF. Per summary will need f/u w/PCP  after leaving skill nursing.Marland KitchenJohny Chess

## 2019-02-09 NOTE — Discharge Summary (Signed)
Physician Discharge Summary  JABAR KRYSIAK VQX:[id number] DOB: 02/03/1946 DOA: 01/28/2019  PCP: [name], MD  Admit date: 01/28/2019 Discharge date: 02/09/2019  Admitted From: Home Disposition:  SNF  Discharge Condition:Stable CODE STATUS:FULL Diet recommendation: Heart Healthy   Brief/Interim Summary:  Patient is a 74 year old male with history of type 2 diabetes mellitus, alcoholic cirrhosis, portal hypertension, PVD, BPH, recent Covid-19  infection presented to the emergency department with right-sided epigastric abdominal pain.  He also reported generalized weakness, fatigue, jaundice.  He drinks about sixteen 8 ounce drinks vodka a day.  In the emergency department, he was found to have elevated liver enzymes.  CT abdomen/pelvis showed mild distended gallbladder with surrounding inflammatory changes, small pericholecystic fluid suggesting  possible cholecystitis.  General surgery was consulted and he is not a candidate for any kind of surgical procedure.  GI and PCCM were also following him. Underwent paracentesis and fluid analysis was suggestive of SBP.  Started on antibiotics. Mental status has significantly improved. Off precedex. Oral diet has been started.  Physical therapy /OT recommended skilled nursing  facility.  He is hemodynamically stable for discharge to skilled nursing facility .  Following problems were addressed during his hospitalization:  Abdominal pain secondary to SBP/possible cholecystitis: CT abdomen/pelvis showed possible acute cholecystitis.  Right upper quadrant ultrasound showed gallstones, mild gallbladder wall thickening.general surgery was consulted and due to his child's class C cirrhosis he is not a candidate for surgical intervention.  General surgery thought that this is less likely a cholecystitis. Underwent diagnostic paracentesis by IR and fluid analysis showed SBP.  MRCP did not show any bile duct dilation, no choledocholithiasis, showed  asymmetric gallbladder thickening, cirrhosis. Peritoneal fluid culture has not shown any growth.  Blood cultures negative so far.  He was on Unasyn,changed to Augmentin which will be continued for total of 2 weeks  and then he will be on ciprofloxacin 500 mg daily for SBP prophylaxis. He does not complain of any abdominal pain today.  Decompensated alcohol cirrhosis/portal hypertension: CT abdomen showed hepatomegaly, cirrhosis, perihepatic ascites.  MELD score  of 28 on admission.  Mild elevated liver enzymes.  GI following.  On lactulose 2 times a day.  Also on rifaximin.  Repeat paracentesis was attempted but he does not have enough fluid on his abdomen  WPW syndrome:There was concern for SVT/A. fib with RVR during this hospitalization, He actually has history of WPW syndrome.  Cardiology concluded that he does not have A. fib.  EKG showed frequent PACs.  Recommended to avoid beta-blockers, calcium.  Cardiology will arrange outpatient EP follow-up.  Alcohol abuse disorder with withdrawals: On 12/28, he became more confused, agitated, tachycardic exhibiting signs of alcohol withdrawal symptoms.  PCCM was following.  He was started on Precedex.  Off Precedex now.  Continue thiamine, folic acid  GERD: Continue PPI  Type 2 diabetes mellitus: Hemoglobin A1c of 8.9 as per 12/20.  On Metformin as an outpatient.  Continue metformin.  Hypertension: BP better.  On lisinopril  Hyponatremia: Much improved  Recent Covid-19 infection: Diagnosed on 01/03/2019, treated with steroids.  Currently respiratory status stable.    Repeat tests are negative.  Weakness/debility: PT/OT recommended skilled nursing facility.  Leukocytosis: Likely reactive.Trending down.Check CBC in a week  Morbid obesity: BMI of 31.2.       Discharge Diagnoses:  Principal Problem:   Acute cholecystitis Active Problems:   GERD   Type 2 diabetes mellitus with complication, with long-term current use of insulin  ([facility])  Essential hypertension   Transaminitis   COVID-19 virus infection   Ascites   Jaundice   Leucocytosis   SBP (spontaneous bacterial peritonitis) (HCC)   Decompensated hepatic cirrhosis (HCC)   WPW (Wolff-Parkinson-White syndrome)    Discharge Instructions  Discharge Instructions    Diet - low sodium heart healthy   Complete by: As directed    Discharge instructions   Complete by: As directed    1)Please take prescribed medications as instructed. 2)Do a CBC and BMP tests in  a week. 3)Follow up with gastroenterology as an outpatient in 3 weeks.  Name and number of the provider has been attached.  You will be called for appointment.   Increase activity slowly   Complete by: As directed      Allergies as of 02/09/2019      Reactions   Beta Adrenergic Blockers    Likely WPW.  Use with caution   Calcium Channel Blockers    Likely WPW.  Use with caution.      Medication List    STOP taking these medications   ibuprofen 200 MG tablet Commonly known as: ADVIL   mirabegron ER 25 MG Tb24 tablet Commonly known as: Myrbetriq   traMADol 50 MG tablet Commonly known as: ULTRAM     TAKE these medications   accu-chek multiclix lancets Use to check sugar daily. DX: E11.8   amoxicillin-clavulanate 875-125 MG tablet Commonly known as: AUGMENTIN Take 1 tablet by mouth every 12 (twelve) hours for 2 days.   benzonatate 200 MG capsule Commonly known as: TESSALON Take 1 capsule (200 mg total) by mouth 3 (three) times daily as needed for cough.   blood glucose meter kit and supplies Kit Use to test blood sugar once daily. DX E11.8   ciprofloxacin 500 MG tablet Commonly known as: Cipro Take 1 tablet (500 mg total) by mouth daily with breakfast. Continue taking it Start taking on: February 12, 2019   cyclobenzaprine 5 MG tablet Commonly known as: FLEXERIL Take 1 to 2 tabs at bedtime if needed. What changed:   how much to take  how to take this  when to take  this  reasons to take this  additional instructions   folic acid 1 MG tablet Commonly known as: FOLVITE Take 1 tablet (1 mg total) by mouth daily.   FreeStyle Freedom Lite w/Device Kit Use to check blood sugars 2 times daily   furosemide 20 MG tablet Commonly known as: LASIX Take 1 tablet (20 mg total) by mouth daily. Start taking on: February 10, 2019   gabapentin 300 MG capsule Commonly known as: NEURONTIN Take 1 capsule (300 mg total) by mouth 2 (two) times daily. What changed: when to take this   glucose blood test strip Commonly known as: Cool Blood Glucose Test Strips Use to check blood sugar daily. DX: E11.8   lactulose 10 GM/15ML solution Commonly known as: CHRONULAC Take 15 mLs (10 g total) by mouth 2 (two) times daily.   lisinopril 10 MG tablet Commonly known as: ZESTRIL Take 1 tablet (10 mg total) by mouth daily. Start taking on: February 10, 2019   metFORMIN 1000 MG (MOD) 24 hr tablet Commonly known as: GLUMETZA Take 1 tablet (1,000 mg total) by mouth daily with breakfast. What changed:   medication strength  See the new instructions.   multivitamin with minerals Tabs tablet Take 1 tablet by mouth daily. Start taking on: February 10, 2019   pantoprazole 40 MG tablet Commonly known as: PROTONIX TAKE  1 TABLET(40 MG) BY MOUTH TWICE DAILY What changed: See the new instructions.   potassium chloride SA 20 MEQ tablet Commonly known as: KLOR-CON Take 1 tablet (20 mEq total) by mouth daily. Start taking on: February 10, 2019   rifaximin 550 MG Tabs tablet Commonly known as: XIFAXAN Take 1 tablet (550 mg total) by mouth 2 (two) times daily.   thiamine 100 MG tablet Take 1 tablet (100 mg total) by mouth daily. Start taking on: February 10, 2019      Follow-up Information    [name], MD. Schedule an appointment as soon as possible for a visit in 2 week(s).   Specialty: Internal Medicine Contact information: [place]  [address] [phone]        Gatha Mayer, MD. Schedule an appointment as soon as possible for a visit in 3 week(s).   Specialty: Gastroenterology Contact information: [address] [address] [phone]          Allergies  Allergen Reactions  . 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  Use with caution  . Calcium Channel Blockers     Likely WPW.  Use with caution.    Consultations:  GI   Procedures/Studies: DG Abd 1 View  Result Date: 02/02/2019 CLINICAL DATA:  Initial evaluation for NG tube placement. EXAM: ABDOMEN - 1 VIEW COMPARISON:  Prior radiograph from 09/06/2015. FINDINGS: Enteric tube in place with tip seen overlying the region of the pylorus/proximal duodenum. Side hole well beyond the GE junction. Tip projects distally. Visualized bowel gas pattern within normal limits. IMPRESSION: Enteric tube in place with tip overlying the region of the pylorus/proximal duodenum. Tip well beyond the GE junction. Electronically Signed   By: Jeannine Boga M.D.   On: 02/02/2019 00:45   CT Abdomen Pelvis W Contrast  Result Date: 01/28/2019 CLINICAL DATA:  Fall right flank hematoma EXAM: CT ABDOMEN AND PELVIS WITH CONTRAST TECHNIQUE: Multidetector CT imaging of the abdomen and pelvis was performed using the standard protocol following bolus administration of intravenous contrast. CONTRAST:  156m OMNIPAQUE IOHEXOL 300 MG/ML  SOLN COMPARISON:  July 27, 2018 FINDINGS: Lower chest: The visualized heart size within normal limits. No pericardial fluid/thickening. No hiatal hernia. Streaky atelectasis seen at both lung bases. Hepatobiliary: Again noted is hepatomegaly with a slightly nodular liver contour, consistent with cirrhosis. There is a low-density lesion in the posterior right liver lobe measuring 6.2 cm, unchanged from the prior CT. The gallbladder appears to be fluid-filled and mildly distended with a small amount of pericholecystic fluid and fat stranding  changes. No definite layering gallstones however are present. No biliary ductal dilatation. Pancreas: Unremarkable. No pancreatic ductal dilatation or surrounding inflammatory changes. Spleen: Normal in size without focal abnormality. Adrenals/Urinary Tract: Both adrenal glands appear normal. The kidneys and collecting system appear normal without evidence of urinary tract calculus or hydronephrosis. Bladder is unremarkable. Stomach/Bowel: The stomach, small bowel, and colon are normal in appearance. No inflammatory changes, wall thickening, or obstructive findings.Scattered colonic diverticula are noted without diverticulitis. The appendix is unremarkable. Vascular/Lymphatic: There are no enlarged mesenteric, retroperitoneal, or pelvic lymph nodes. A small amount of perihepatic ascites is noted. Scattered aortic atherosclerotic calcifications are seen without aneurysmal dilatation. Reproductive: The prostate contains radiation prostate seeds. Other: No evidence of abdominal wall mass or hernia. Musculoskeletal: No acute or significant osseous findings. IMPRESSION: Mildly distended gallbladder with a small amount of pericholecystic fluid and surrounding inflammatory changes. This could be due  to cholecystitis. If further evaluation is required would recommend right upper quadrant ultrasound Hepatomegaly and findings suggestive of cirrhosis. Small perihepatic ascites Aortic Atherosclerosis (ICD10-I70.0). Unchanged 6 cm hepatic cyst. Electronically Signed   By: Prudencio Pair M.D.   On: 01/28/2019 23:23   MR 3D Recon At Scanner  Result Date: 01/31/2019 CLINICAL DATA:  Alcoholic cirrhosis. Jaundice. Rule out intra and extrahepatic biliary duct obstruction. Diabetes. Portal hypertension. Recent COVID infection 1 month ago. EXAM: MRI ABDOMEN WITHOUT AND WITH CONTRAST (INCLUDING MRCP) TECHNIQUE: Multiplanar multisequence MR imaging of the abdomen was performed both before and after the administration of intravenous  contrast. Heavily T2-weighted images of the biliary and pancreatic ducts were obtained, and three-dimensional MRCP images were rendered by post processing. CONTRAST:  57m GADAVIST GADOBUTROL 1 MMOL/ML IV SOLN COMPARISON:  01/29/2019 abdominal ultrasound. 01/28/2019 abdominopelvic CT. 07/27/2018 abdominal MRI. FINDINGS: Mild motion degradation throughout. Lower chest: Bibasilar atelectasis. Normal heart size. Trace left pleural fluid. Hepatobiliary: Moderate cirrhosis. Dominant right hepatic lobe cyst. No suspicious liver lesion. The gallstones and sludge are more apparent on ultrasound. There is primarily medial gallbladder wall thickening including at 8 mm on 36/8. New since the prior MRI. Mild mucosal enhancement throughout the gallbladder, without surrounding hyperemia. No intra or extrahepatic biliary duct dilatation. The common duct measures on the order of 4 mm on 82/6. No choledocholithiasis. Pancreas: Normal pancreas for age, without duct dilatation or acute inflammation. Spleen:  Borderline splenomegaly at 14.7 cm craniocaudal. Adrenals/Urinary Tract: Normal adrenal glands. Normal kidneys, without hydronephrosis. Stomach/Bowel: Gastric antral underdistention. Normal caliber of abdominal bowel loops. Vascular/Lymphatic: Aortic atherosclerosis. Patent portal, splenic, hepatic veins. Prominent porta hepatis nodes are likely reactive and similar. Other: Small volume abdominal ascites, increased compared to the prior CT. For example, new fluid is seen surrounding the left hepatic lobe and extending in the lesser sac, including on 34/3. Musculoskeletal: No acute osseous abnormality. IMPRESSION: 1. Mildly motion degraded exam. 2. No biliary duct dilatation or choledocholithiasis. 3. Asymmetric gallbladder wall thickening in the setting of stones/sludge on ultrasound. Nonspecific in the setting of cirrhosis. Cannot exclude a component of chronic cholecystitis. 4. Cirrhosis. 5. Trace left pleural fluid. 6. Increase  in small volume abdominal ascites. Electronically Signed   By: KAbigail MiyamotoM.D.   On: 01/31/2019 18:57   [place]acent[place]  Result Date: 01/31/2019 INDICATION: Patient with history of alcoholic cirrhosis, portal hypertension, recent COVID-19 infection, abdominal pain, ascites. Request made for diagnostic paracentesis up to 100 cc. EXAM: ULTRASOUND GUIDED DIAGNOSTIC PARACENTESIS MEDICATIONS: None COMPLICATIONS: None immediate. PROCEDURE: Informed written consent was obtained from the patient after a discussion of the risks, benefits and alternatives to treatment. A timeout was performed prior to the initiation of the procedure. Initial ultrasound scanning demonstrates a small amount of ascites within the right upper to mid abdominal quadrant. The right upper to mid abdomen was prepped and draped in the usual sterile fashion. 1% lidocaine was used for local anesthesia. Following this, a 19 gauge, 7-cm, Yueh catheter was introduced. An ultrasound image was saved for documentation purposes. The paracentesis was performed. The catheter was removed and a dressing was applied. The patient tolerated the procedure well without immediate post procedural complication. FINDINGS: A total of approximately 100 cc of turbid, amber fluid was removed. Samples were sent to the laboratory as requested by the clinical team. IMPRESSION: Successful ultrasound-guided diagnostic paracentesis yielding 100 cc of peritoneal fluid. Read by: KRowe Robert PA-C Electronically Signed   By: JSandi MariscalM.D.   On: 01/31/2019 15:10  DG Chest Port 1 View  Result Date: 01/28/2019 CLINICAL DATA:  Shortness of breath, diagnosed with COVID-19 on 01/03/2019, increased fatigue, jaundice, cirrhosis EXAM: PORTABLE CHEST 1 VIEW COMPARISON:  Portable exam 1759 hours compared to 02/19/2018 FINDINGS: Normal heart size, mediastinal contours, and pulmonary vascularity. Atherosclerotic calcification aorta. Mild LEFT basilar atelectasis. Lungs otherwise  clear. No pulmonary infiltrate, pleural effusion or pneumothorax. Multilevel endplate spur formation thoracic spine. IMPRESSION: Mild LEFT basilar atelectasis. Electronically Signed   By: Lavonia Dana M.D.   On: 01/28/2019 18:17   MR ABDOMEN MRCP W WO CONTAST  Result Date: 01/31/2019 CLINICAL DATA:  Alcoholic cirrhosis. Jaundice. Rule out intra and extrahepatic biliary duct obstruction. Diabetes. Portal hypertension. Recent COVID infection 1 month ago. EXAM: MRI ABDOMEN WITHOUT AND WITH CONTRAST (INCLUDING MRCP) TECHNIQUE: Multiplanar multisequence MR imaging of the abdomen was performed both before and after the administration of intravenous contrast. Heavily T2-weighted images of the biliary and pancreatic ducts were obtained, and three-dimensional MRCP images were rendered by post processing. CONTRAST:  58m GADAVIST GADOBUTROL 1 MMOL/ML IV SOLN COMPARISON:  01/29/2019 abdominal ultrasound. 01/28/2019 abdominopelvic CT. 07/27/2018 abdominal MRI. FINDINGS: Mild motion degradation throughout. Lower chest: Bibasilar atelectasis. Normal heart size. Trace left pleural fluid. Hepatobiliary: Moderate cirrhosis. Dominant right hepatic lobe cyst. No suspicious liver lesion. The gallstones and sludge are more apparent on ultrasound. There is primarily medial gallbladder wall thickening including at 8 mm on 36/8. New since the prior MRI. Mild mucosal enhancement throughout the gallbladder, without surrounding hyperemia. No intra or extrahepatic biliary duct dilatation. The common duct measures on the order of 4 mm on 82/6. No choledocholithiasis. Pancreas: Normal pancreas for age, without duct dilatation or acute inflammation. Spleen:  Borderline splenomegaly at 14.7 cm craniocaudal. Adrenals/Urinary Tract: Normal adrenal glands. Normal kidneys, without hydronephrosis. Stomach/Bowel: Gastric antral underdistention. Normal caliber of abdominal bowel loops. Vascular/Lymphatic: Aortic atherosclerosis. Patent portal,  splenic, hepatic veins. Prominent porta hepatis nodes are likely reactive and similar. Other: Small volume abdominal ascites, increased compared to the prior CT. For example, new fluid is seen surrounding the left hepatic lobe and extending in the lesser sac, including on 34/3. Musculoskeletal: No acute osseous abnormality. IMPRESSION: 1. Mildly motion degraded exam. 2. No biliary duct dilatation or choledocholithiasis. 3. Asymmetric gallbladder wall thickening in the setting of stones/sludge on ultrasound. Nonspecific in the setting of cirrhosis. Cannot exclude a component of chronic cholecystitis. 4. Cirrhosis. 5. Trace left pleural fluid. 6. Increase in small volume abdominal ascites. Electronically Signed   By: KAbigail MiyamotoM.D.   On: 01/31/2019 18:57   ECHOCARDIOGRAM COMPLETE  Result Date: 01/29/2019   ECHOCARDIOGRAM REPORT   Patient Name:   LDERIC BOCOCKDate of Exam: 01/29/2019 Medical Rec #:  [id number]         Height:       74.0 in Accession #:    [id number]        Weight:       235.0 lb Date of Birth:  [birth year]         BSA:          2.33 m Patient Age:    89years           BP:           134/68 mmHg Patient Gender: M                  HR:           117 bpm. Exam Location:  Inpatient  Procedure: 2D Echo and Intracardiac Opacification Agent Indications:    Elevated troponin; I10 Hypertension  History:        Patient has no prior history of Echocardiogram examinations.                 Signs/Symptoms:Altered Mental Status. Covid19.  Sonographer:    Merrie Roof RDCS Referring Phys: [place]  1. Left ventricular ejection fraction, by visual estimation, is 60 to 65%. The left ventricle has normal function. There is no left ventricular hypertrophy.  2. Definity contrast agent was given IV to delineate the left ventricular endocardial borders.  3. Left ventricular diastolic parameters are consistent with Grade I diastolic dysfunction (impaired relaxation).  4. The left ventricle has no  regional wall motion abnormalities.  5. Global right ventricle has normal systolic function.The right ventricular size is normal. No increase in right ventricular wall thickness.  6. Left atrial size was normal.  7. Right atrial size was normal.  8. The mitral valve is normal in structure. No evidence of mitral valve regurgitation. No evidence of mitral stenosis.  9. The tricuspid valve is normal in structure. 10. The aortic valve is normal in structure. Aortic valve regurgitation is not visualized. No evidence of aortic valve sclerosis or stenosis. 11. The pulmonic valve was normal in structure. Pulmonic valve regurgitation is not visualized. 12. The inferior vena cava is normal in size with greater than 50% respiratory variability, suggesting right atrial pressure of 3 mmHg. FINDINGS  Left Ventricle: Left ventricular ejection fraction, by visual estimation, is 60 to 65%. The left ventricle has normal function. Definity contrast agent was given IV to delineate the left ventricular endocardial borders. The left ventricle has no regional wall motion abnormalities. There is no left ventricular hypertrophy. Left ventricular diastolic parameters are consistent with Grade I diastolic dysfunction (impaired relaxation). Normal left atrial pressure. Right Ventricle: The right ventricular size is normal. No increase in right ventricular wall thickness. Global RV systolic function is has normal systolic function. Left Atrium: Left atrial size was normal in size. Right Atrium: Right atrial size was normal in size Pericardium: There is no evidence of pericardial effusion. Mitral Valve: The mitral valve is normal in structure. No evidence of mitral valve regurgitation. No evidence of mitral valve stenosis by observation. Tricuspid Valve: The tricuspid valve is normal in structure. Tricuspid valve regurgitation is not demonstrated. Aortic Valve: The aortic valve is normal in structure. Aortic valve regurgitation is not  visualized. The aortic valve is structurally normal, with no evidence of sclerosis or stenosis. Pulmonic Valve: The pulmonic valve was normal in structure. Pulmonic valve regurgitation is not visualized. Pulmonic regurgitation is not visualized. Aorta: The aortic root, ascending aorta and aortic arch are all structurally normal, with no evidence of dilitation or obstruction. Venous: The inferior vena cava is normal in size with greater than 50% respiratory variability, suggesting right atrial pressure of 3 mmHg. IAS/Shunts: No atrial level shunt detected by color flow Doppler. There is no evidence of a patent foramen ovale. No ventricular septal defect is seen or detected. There is no evidence of an atrial septal defect.  LEFT VENTRICLE PLAX 2D LVIDd:         4.90 cm LVIDs:         3.20 cm LV PW:         1.10 cm LV IVS:        1.10 cm LVOT diam:     1.90 cm LV SV:  72 ml LV SV Index:   30.16 LVOT Area:     2.84 cm  LEFT ATRIUM           Index LA diam:      4.10 cm 1.76 cm/m LA Vol (A2C): 23.7 ml 10.18 ml/m LA Vol (A4C): 53.9 ml 23.16 ml/m  AORTIC VALVE LVOT Vmax:   150.00 cm/s LVOT Vmean:  7010.000 cm/s  SHUNTS Systemic Diam: 1.90 cm  Candee Furbish MD Electronically signed by Candee Furbish MD Signature Date/Time: 01/29/2019/3:45:26 PM    Final    [place] ASCITES (ABDOMEN LIMITED)  Result Date: 02/05/2019 CLINICAL DATA:  Ascites. EXAM: LIMITED ABDOMEN ULTRASOUND FOR ASCITES TECHNIQUE: Limited ultrasound survey for ascites was performed in all four abdominal quadrants. COMPARISON:  January 31, 2019. FINDINGS: Minimal amount of fluid is noted in the left lower quadrant. No other ascites is noted. IMPRESSION: Only minimal ascites is noted. Adequate fluid pocket for paracentesis was not identified. Electronically Signed   By: Marijo Conception M.D.   On: 02/05/2019 14:21   US Abdomen Limited RUQ  Result Date: 01/29/2019 CLINICAL DATA:  Jaundice EXAM: ULTRASOUND ABDOMEN LIMITED RIGHT UPPER QUADRANT COMPARISON:   None. FINDINGS: Gallbladder: Layering sludge/stones seen within gallbladder there is a edematous mildly prominent gallbladder wall measuring 3.1 mm. No sonographic Murphy sign. Common bile duct: Diameter: 3.7 mm Liver: Increased echotexture seen throughout. There is an anechoic cyst within the right liver lobe measuring 7.3 x 5.7 x 5.1 cm. The main portal vein appears patent. Other: None. IMPRESSION: Layering sludge/stones with mild gallbladder wall thickening. This could be due to mild acute cholecystitis. Electronically Signed   By: Prudencio Pair M.D.   On: 01/29/2019 01:55       Subjective: Patient seen and examined at the bedside this morning.  Hemodynamically stable for discharge.  Discharge Exam: Vitals:   02/09/19 0433 02/09/19 0843  BP: (!) 154/88 (!) 146/65  Pulse: 93 98  Resp: (!) 24 19  Temp: 99.8 F (37.7 C) 99.3 F (37.4 C)  SpO2: 96% 97%   Vitals:   02/08/19 2317 02/09/19 0104 02/09/19 0433 02/09/19 0843  BP: (!) 147/68 (!) 157/66 (!) 154/88 (!) 146/65  Pulse: (!) 112 87 93 98  Resp: 18 20 (!) 24 19  Temp: (!) 100.4 F (38 C) (!) 100.4 F (38 C) 99.8 F (37.7 C) 99.3 F (37.4 C)  TempSrc: Oral Oral Oral Oral  SpO2: 97% 97% 96% 97%  Weight:      Height:        General: Pt is alert, awake, not in acute distress Cardiovascular: RRR, S1/S2 +, no rubs, no gallops Respiratory: CTA bilaterally, no wheezing, no rhonchi Abdominal: Soft, NT, ND, bowel sounds + Extremities: no edema, no cyanosis    The results of significant diagnostics from this hospitalization (including imaging, microbiology, ancillary and laboratory) are listed below for reference.     Microbiology: Recent Results (from the past 240 hour(s))  Aerobic/Anaerobic Culture (surgical/deep wound)     Status: None   Collection Time: 01/31/19  3:25 PM   Specimen: Abdomen; Peritoneal Fluid  Result Value Ref Range Status   Specimen Description   Final    PERITONEAL Performed at [place] [address], [place], [place] [address]    Special Requests   Final    NONE Performed at [facility], [place] [address] [place], [place] [address]    Gram Stain   Final    ABUNDANT WBC PRESENT,  PREDOMINANTLY PMN NO ORGANISMS SEEN    Culture   Final    No growth aerobically or anaerobically. Performed at [facility], [place] [address], [place], [place] [address]    Report Status 02/05/2019 FINAL  Final  MRSA PCR Screening     Status: None   Collection Time: 02/01/19  5:18 PM   Specimen: Nasal Mucosa; Nasopharyngeal  Result Value Ref Range Status   MRSA by PCR NEGATIVE NEGATIVE Final    Comment:        The GeneXpert MRSA Assay (FDA approved for NASAL specimens only), is one component of a comprehensive MRSA colonization surveillance program. It is not intended to diagnose MRSA infection nor to guide or monitor treatment for MRSA infections. Performed at [facility], [place] [address] [place], [place] [address]   SARS CORONAVIRUS 2 (TAT 6-24 HRS) Nasopharyngeal Nasopharyngeal Swab     Status: None   Collection Time: 02/07/19  4:49 PM   Specimen: Nasopharyngeal Swab  Result Value Ref Range Status   SARS Coronavirus 2 NEGATIVE NEGATIVE Final    Comment: (NOTE) SARS-CoV-2 target nucleic acids are NOT DETECTED. The SARS-CoV-2 RNA is generally detectable in upper and lower respiratory specimens during the acute phase of infection. Negative results do not preclude SARS-CoV-2 infection, do not rule out co-infections with other pathogens, and should not be used as the sole basis for treatment or other patient management decisions. Negative results must be combined with clinical observations, patient history, and epidemiological information. The expected result is Negative. Fact Sheet for Patients: SugarRoll.be Fact Sheet for Healthcare Providers: https://www.woods-mathews.com/ This  test is not yet approved or cleared by the [place] FDA and  has been authorized for detection and/or diagnosis of SARS-CoV-2 by FDA under an Emergency Use Authorization (EUA). This EUA will remain  in effect (meaning this test can be used) for the duration of the COVID-19 declaration under Section 56 4(b)(1) of the Act, 21 U.S.C. section 360bbb-3(b)(1), unless the authorization is terminated or revoked sooner. Performed at [facility], [place] [address], [place], [place] [address]      Labs: BNP (last 3 results) No results for input(s): BNP in the last 8760 hours. Basic Metabolic Panel: Recent Labs  Lab 02/03/19 0203 02/04/19 0217 02/04/19 2222 02/05/19 0151 02/06/19 0546  NA 137 141 140 140 138  K 3.5 3.2* 3.6 3.8 3.7  CL 99 105 103 103 101  CO2 26 26 26 25 25   GLUCOSE 164* 130* 232* 192* 225*  BUN 35* 31* 29* 27* 20  CREATININE 0.71 0.75 0.97 0.82 0.78  CALCIUM 7.9* 7.9* 8.2* 8.3* 8.5*  MG 2.2 2.1 2.1  --   --    Liver Function Tests: Recent Labs  Lab 02/03/19 0203 02/04/19 0217 02/05/19 0151 02/06/19 0546  AST 47* 43* 53* 32  ALT 25 25 30 25   ALKPHOS 218* 216* 259* 242*  BILITOT 10.0* 6.8* 6.4* 5.8*  PROT 5.6* 5.5* 6.2* 6.4*  ALBUMIN 2.7* 2.5* 2.5* 2.7*   No results for input(s): LIPASE, AMYLASE in the last 168 hours. Recent Labs  Lab 02/04/19 0217 02/05/19 0151 02/06/19 0546  AMMONIA 77* 35 26   CBC: Recent Labs  Lab 02/03/19 0203 02/04/19 0217 02/06/19 0546 02/07/19 0521 02/08/19 0603  WBC 15.7* 12.0* 16.0* 17.0* 15.2*  NEUTROABS  --   --  12.7* 13.6* 12.5*  HGB 11.3* 11.1* 11.2* 9.7* 10.2*  HCT 33.8* 32.8* 34.3* 29.6* 31.4*  MCV 100.6* 100.3* 101.8* 102.1*  103.0*  PLT 117* 122* 154 148* 143*   Cardiac Enzymes: No results for input(s): CKTOTAL, CKMB, CKMBINDEX, TROPONINI in the last 168 hours. BNP: Invalid input(s): POCBNP CBG: Recent Labs  Lab 02/08/19 2049 02/09/19 0005 02/09/19 0431 02/09/19 0729 02/09/19 0944  GLUCAP  206* 245* 207* 192* 173*   D-Dimer No results for input(s): DDIMER in the last 72 hours. Hgb A1c No results for input(s): HGBA1C in the last 72 hours. Lipid Profile No results for input(s): CHOL, HDL, LDLCALC, TRIG, CHOLHDL, LDLDIRECT in the last 72 hours. Thyroid function studies No results for input(s): TSH, T4TOTAL, T3FREE, THYROIDAB in the last 72 hours.  Invalid input(s): FREET3 Anemia work up No results for input(s): VITAMINB12, FOLATE, FERRITIN, TIBC, IRON, RETICCTPCT in the last 72 hours. Urinalysis    Component Value Date/Time   COLORURINE AMBER (A) 01/29/2019 0047   APPEARANCEUR HAZY (A) 01/29/2019 0047   LABSPEC 1.026 01/29/2019 0047   PHURINE 5.0 01/29/2019 0047   GLUCOSEU >=500 (A) 01/29/2019 0047   GLUCOSEU NEGATIVE 11/15/2014 1516   HGBUR NEGATIVE 01/29/2019 0047   BILIRUBINUR MODERATE (A) 01/29/2019 0047   BILIRUBINUR Positive 11/11/2017 1037   KETONESUR NEGATIVE 01/29/2019 0047   PROTEINUR NEGATIVE 01/29/2019 0047   UROBILINOGEN 1.0 11/11/2017 1037   UROBILINOGEN 2.0 (A) 11/15/2014 1516   NITRITE NEGATIVE 01/29/2019 0047   LEUKOCYTESUR NEGATIVE 01/29/2019 0047   Sepsis Labs Invalid input(s): PROCALCITONIN,  WBC,  LACTICIDVEN Microbiology Recent Results (from the past 240 hour(s))  Aerobic/Anaerobic Culture (surgical/deep wound)     Status: None   Collection Time: 01/31/19  3:25 PM   Specimen: Abdomen; Peritoneal Fluid  Result Value Ref Range Status   Specimen Description   Final    PERITONEAL Performed at [facility], [place] [address] [place], [place] [address]    Special Requests   Final    NONE Performed at [facility], [place] [address], [place], [place] [address]    Gram Stain   Final    ABUNDANT WBC PRESENT, PREDOMINANTLY PMN NO ORGANISMS SEEN    Culture   Final    No growth aerobically or anaerobically. Performed at [facility], [place] [address] [place], [place] [address]    Report Status  02/05/2019 FINAL  Final  MRSA PCR Screening     Status: None   Collection Time: 02/01/19  5:18 PM   Specimen: Nasal Mucosa; Nasopharyngeal  Result Value Ref Range Status   MRSA by PCR NEGATIVE NEGATIVE Final    Comment:        The GeneXpert MRSA Assay (FDA approved for NASAL specimens only), is one component of a comprehensive MRSA colonization surveillance program. It is not intended to diagnose MRSA infection nor to guide or monitor treatment for MRSA infections. Performed at [facility], [place] [address] [place], [place] [address]   SARS CORONAVIRUS 2 (TAT 6-24 HRS) Nasopharyngeal Nasopharyngeal Swab     Status: None   Collection Time: 02/07/19  4:49 PM   Specimen: Nasopharyngeal Swab  Result Value Ref Range Status   SARS Coronavirus 2 NEGATIVE NEGATIVE Final    Comment: (NOTE) SARS-CoV-2 target nucleic acids are NOT DETECTED. The SARS-CoV-2 RNA is generally detectable in upper and lower respiratory specimens during the acute phase of infection. Negative results do not preclude SARS-CoV-2 infection, do not rule out co-infections with other pathogens, and should not be used as the sole basis for treatment or other patient management decisions. Negative results must be combined with  clinical observations, patient history, and epidemiological information. The expected result is Negative. Fact Sheet for Patients: SugarRoll.be Fact Sheet for Healthcare Providers: https://www.woods-mathews.com/ This test is not yet approved or cleared by the [place] FDA and  has been authorized for detection and/or diagnosis of SARS-CoV-2 by FDA under an Emergency Use Authorization (EUA). This EUA will remain  in effect (meaning this test can be used) for the duration of the COVID-19 declaration under Section 56 4(b)(1) of the Act, 21 U.S.C. section 360bbb-3(b)(1), unless the authorization is terminated or revoked  sooner. Performed at [facility], [place] [address] [place], [place] [address]     Please note: You were cared for by a hospitalist during your hospital stay. Once you are discharged, your primary care physician will handle any further medical issues. Please note that NO REFILLS for any discharge medications will be authorized once you are discharged, as it is imperative that you return to your primary care physician (or establish a relationship with a primary care physician if you do not have one) for your post hospital discharge needs so that they can reassess your need for medications and monitor your lab values.    Time coordinating discharge: 40 minutes  SIGNED:   Shelly Coss, MD  [facility] 02/09/2019, 10:38 AM Pager [phone]  If 7PM-7AM, please contact night-coverage [email or web address] Password [email or web address]

## 2019-02-11 ENCOUNTER — Other Ambulatory Visit: Payer: Self-pay | Admitting: *Deleted

## 2019-02-11 NOTE — Patient Outreach (Signed)
Late entry for 02/10/19  Screened for potential [facility] Care Management needs as a benefit of [employer] Medicare.  Recently admitted to [facility] SNF for skilled therapy.  Writer attended telephonic interdisciplinary team meeting to assess for disposition needs and transition plan for resident.   Will continue to follow for potential needs while member resides in SNF.   Marthenia Rolling, MSN-Ed, RN,BSN [place] Acute Care Coordinator [phone] [facility]) [phone]  (Toll free office)

## 2019-02-17 ENCOUNTER — Other Ambulatory Visit: Payer: Self-pay | Admitting: *Deleted

## 2019-02-17 ENCOUNTER — Telehealth: Payer: Self-pay

## 2019-02-17 NOTE — Telephone Encounter (Signed)
Telepehone call from patients wife [name] after RN left message requesting call back to obtain consent for palliative care services.  Wife in agreement with services.  Wife states she will discuss with patient tonight and call RN back if patient is not in agreement with services.  Wife informed team will plan to see patient next week.  Wife verbalized appreciation for call.

## 2019-02-17 NOTE — Patient Outreach (Signed)
Screened for potential [facility] Care Management needs as a benefit of  [employer] Medicare.  Patrick Kidd is currently receiving skilled therapy at [facility].   Writer attended telephonic interdisciplinary team meeting to assess for disposition needs and transition plan for resident.   Facility reports member is from home with wife prior to admission. Likely to return home with wife and home health services at [facility] discharge. Palliative care referral suggested due to member's extensive medical history.   Will continue to follow for disposition needs and plan. Will plan outreach for potential [place] Management services as appropriate.    Marthenia Rolling, MSN-Ed, RN,BSN [place] Acute Care Coordinator [phone] [facility]) [phone]  (Toll free office)

## 2019-02-17 NOTE — Telephone Encounter (Signed)
Telephone call to patients wife to obtain consent for palliative care team to see patient at [facility].  Wife didn ot answer phone, RN left message requesting call back.

## 2019-02-22 ENCOUNTER — Telehealth: Payer: Self-pay

## 2019-02-22 NOTE — Telephone Encounter (Signed)
Telephone call to wife after receiving call from discharge planner at facility.  Wife and facility in agreement with palliative care team visiting patient on 02/24/19 at 2:00.

## 2019-02-24 ENCOUNTER — Other Ambulatory Visit: Payer: Medicare Other

## 2019-02-24 ENCOUNTER — Other Ambulatory Visit: Payer: Self-pay | Admitting: *Deleted

## 2019-02-24 ENCOUNTER — Non-Acute Institutional Stay: Payer: Medicare Other

## 2019-02-24 ENCOUNTER — Other Ambulatory Visit: Payer: Self-pay

## 2019-02-24 DIAGNOSIS — I4891 Unspecified atrial fibrillation: Secondary | ICD-10-CM | POA: Diagnosis not present

## 2019-02-24 DIAGNOSIS — I10 Essential (primary) hypertension: Secondary | ICD-10-CM | POA: Diagnosis not present

## 2019-02-24 DIAGNOSIS — K7290 Hepatic failure, unspecified without coma: Secondary | ICD-10-CM | POA: Diagnosis not present

## 2019-02-24 DIAGNOSIS — E1140 Type 2 diabetes mellitus with diabetic neuropathy, unspecified: Secondary | ICD-10-CM | POA: Diagnosis not present

## 2019-02-24 DIAGNOSIS — M6281 Muscle weakness (generalized): Secondary | ICD-10-CM | POA: Diagnosis not present

## 2019-02-24 DIAGNOSIS — Z515 Encounter for palliative care: Secondary | ICD-10-CM

## 2019-02-24 DIAGNOSIS — K7460 Unspecified cirrhosis of liver: Secondary | ICD-10-CM | POA: Diagnosis not present

## 2019-02-24 DIAGNOSIS — Z8616 Personal history of COVID-19: Secondary | ICD-10-CM | POA: Diagnosis not present

## 2019-02-24 DIAGNOSIS — K219 Gastro-esophageal reflux disease without esophagitis: Secondary | ICD-10-CM | POA: Diagnosis not present

## 2019-02-24 NOTE — Patient Outreach (Signed)
Error encounter. 

## 2019-02-24 NOTE — Progress Notes (Signed)
COMMUNITY PALLIATIVE CARE SW NOTE  PATIENT NAME: Patrick Kidd DOB: 02/21/1945 MRN: [id number]  PRIMARY CARE PROVIDER: [name], MD  RESPONSIBLE PARTY:  Acct ID - Guarantor Home Phone Work Phone Relationship Acct Type  [id number] - [place],[phone] Self P/F     [address], [place], [place] [address]     PLAN OF CARE and INTERVENTIONS:             1. GOALS OF CARE/ ADVANCE CARE PLANNING:  Patient is a FULL CODE. Goal is to return home tomorrow and continue with therapy. Patient wants to "walk a straight line". 2. SOCIAL/EMOTIONAL/SPIRITUAL ASSESSMENT/ INTERVENTIONS:  SW and RN met with patient at [facility]. Patient was discharged to the facility following a hospitalization at [facility] for acute cholecystitis. Patient denies pain today. Patient said his appetite is average. Patient is resting well. Patient said he has made good improvement since being at the facility and walking further distances. No falls reported. Patient is married, was living with his wife prior to admission to facility. Patient is retired, worked as a Marine scientist. Patient has two adult children and three grandchildren that are local. Patient enjoys his family and going on vacation with them.   3. PATIENT/CAREGIVER EDUCATION/ COPING:  Patient is alert, oriented. Patient expressed feelings with team. Patient is hopeful to continue improving. Denies concerns. Family is supportive. 4. PERSONAL EMERGENCY PLAN:  Facility will follow protocol. 5. COMMUNITY RESOURCES COORDINATION/ HEALTH CARE NAVIGATION:  Patient is working with PT/OT at facility. Patient plan is to continue with home health PT/OT. 6. FINANCIAL/LEGAL CONCERNS/INTERVENTIONS:  None.      SOCIAL HX:  Social History   Tobacco Use  . Smoking status: Former Smoker    Packs/day: 2.00    Years: 6.00    Pack years: 12.00    Types: Cigarettes    Quit date: 02/04/1971    Years since quitting: 48.0  . Smokeless  tobacco: Never Used  . Tobacco comment: smoked age [age], up to 2 ppd  Substance Use Topics  . Alcohol use: Yes    Alcohol/week: 28.0 standard drinks    Types: 28 Shots of liquor per week    Comment: 2-3 vodka martinins a day    CODE STATUS:   Code Status: Prior (FULL) ADVANCED DIRECTIVES: N MOST FORM COMPLETE:  No. HOSPICE EDUCATION PROVIDED: None.  PPS: Patient is walking with a walker. Patient is mostly independent of ADLs.   I spent 30 minutes with patient/family, from 1:15-1:45p providing education, support and consultation.   Margaretmary Lombard, LCSW

## 2019-02-24 NOTE — Progress Notes (Signed)
PATIENT NAME: Patrick Kidd DOB: 1945/06/29 MRN: [id number]  PRIMARY CARE PROVIDER: [name], MD  RESPONSIBLE PARTY:  Acct ID - Guarantor Home Phone Work Phone Relationship Acct Type  [id number] - [place],[phone] Self P/F     [address], [place], [place] [address]    PLAN OF CARE and INTERVENTIONS:               1.  GOALS OF CARE/ ADVANCE CARE PLANNING:  Patient wants to continue to improve and get better.  Patient wants to ambulate better.                2.  PATIENT/CAREGIVER EDUCATION:  Education on fall precautions, education on s/s of infection, support               3.  DISEASE STATUS: SW and RN made visit to facility to explain palliative care services and assess patient. Patient lying in bed on his right side sleeping soundly. Patient wakes easily and states wife informed him palliative care team would be visiting today. Patient's past medical history includes but not limited to peripheral vascular disease, hypertension, gerd, alcoholic cirrhosis, liver disease, type 2 diabetes, peripheral neuropathy, degenerative disc disease, history of neoplasm of prostate, overactive bladder, weakness, history of covid-19 Virus Infection, ascites, jaundice and leukocytosis.  Patient states he will be returning home tomorrow. Patient excited to go home and states he has not been at home since Christmas. Patient's abdomen is distended due to ascites.  Patient on lactulose twice a day. Patient denies having pain at the present time. Patient states when he arrived at facility he could only walk 2 steps. Patient states he was able to complete 720 steps today. Patient reports his current weight is 232 lbs. Patient reports his appetite is fair. Patient denies having any shortness of breath. Patient reports he has a chronic dry cough. Patient's vital signs are stable. Patient has edema in lower extremities right greater than the left. Patient has 2 + edema in right lower  extremity and 1 + edema in left lower extremitiy. Patient denies suffering any falls while being at facility. Patient informed team would plan to see him at home next month.  Patient in agreement with palliative care services.  Palliative folder left in room with patient.  Patient remains a Full Code.   Patient does not have a living will or [place] of attorney. Patient encouraged to take meds as directed,  education on signs and symptoms of infection and education on fall precautions. Patient encouraged to contact palliative care with questions or concerns.    HISTORY OF PRESENT ILLNESS:  Patient is a 74 year old male who is at [facility] for rehab.  Patient will be discharging home tomorrow.  Patient open to palliative care services and will be seen monthly and PRN.  CODE STATUS: Full Code  ADVANCED DIRECTIVES: No MOST FORM: No PPS: 50%   PHYSICAL EXAM:   VITALS: Today's Vitals   02/24/19 1330  BP: 110/70  Pulse: 100  Resp: 18  Temp: 99.1 F (37.3 C)  TempSrc: Temporal  SpO2: 98%  Weight: 232 lb (105.2 kg)  PainSc: 0-No pain    LUNGS: Patients breathsounds are diminished, patient denies having shortness of breath, chronin nonproductive cough CARDIAC: Cor RRR  EXTREMITIES: 2+ edema in RLE, 1+ edmea in LLE  SKIN: patient has no open areas of skin breakdown   NEURO: positive for gait problems and weakness  Lavanya Roa, RN 

## 2019-02-24 NOTE — Patient Outreach (Signed)
Screened for potential [facility] Care Management needs as a benefit of  [employer] Medicare.  Mr. Kuehler is currently receiving skilled therapy at [facility].   Writer attended telephonic interdisciplinary team meeting to assess for disposition needs and transition plan for resident.   Facility [employer] planner reports Mr. Grech will transition to home on tomorrow with [facility] for PT/OT/RN/aide services. Palliative referral was made.   Will plan outreach to discuss potential [facility] Care Management needs.    Marthenia Rolling, MSN-Ed, RN,BSN [place] Acute Care Coordinator [phone] [facility]) [phone]  (Toll free office)

## 2019-02-25 ENCOUNTER — Other Ambulatory Visit: Payer: Self-pay | Admitting: *Deleted

## 2019-02-25 NOTE — Patient Outreach (Addendum)
Member screened for potential [facility] Care Management needs as a benefit of [place] Medicare.  Mr. Folks slated to transition home from [facility] SNF today 02/25/19. Facility previously reported member will have [facility] for PT/OT/RN/aide. He will also be followed by [facility].   Telephone call made to Mr. Hovel to discuss [place] Management services at [phone]. No answer. HIPAA compliant voicemail message left.   Will make [place] Management RNCM referral due to member's increased risk for readmission. Has a medical history of DM, COVID (01/03/19), portal hypertension, alcoholic cirrhosis, BPH.   Marthenia Rolling, MSN-Ed, RN,BSN [place] Acute Care Coordinator [phone] [facility]) [phone]  (Toll free office)

## 2019-02-27 DIAGNOSIS — E1151 Type 2 diabetes mellitus with diabetic peripheral angiopathy without gangrene: Secondary | ICD-10-CM | POA: Diagnosis not present

## 2019-02-27 DIAGNOSIS — I10 Essential (primary) hypertension: Secondary | ICD-10-CM | POA: Diagnosis not present

## 2019-02-27 DIAGNOSIS — I456 Pre-excitation syndrome: Secondary | ICD-10-CM | POA: Diagnosis not present

## 2019-02-27 DIAGNOSIS — K766 Portal hypertension: Secondary | ICD-10-CM | POA: Diagnosis not present

## 2019-02-27 DIAGNOSIS — K652 Spontaneous bacterial peritonitis: Secondary | ICD-10-CM | POA: Diagnosis not present

## 2019-02-27 DIAGNOSIS — K219 Gastro-esophageal reflux disease without esophagitis: Secondary | ICD-10-CM | POA: Diagnosis not present

## 2019-02-27 DIAGNOSIS — Z8616 Personal history of COVID-19: Secondary | ICD-10-CM | POA: Diagnosis not present

## 2019-02-27 DIAGNOSIS — K810 Acute cholecystitis: Secondary | ICD-10-CM | POA: Diagnosis not present

## 2019-02-27 DIAGNOSIS — E1142 Type 2 diabetes mellitus with diabetic polyneuropathy: Secondary | ICD-10-CM | POA: Diagnosis not present

## 2019-02-27 DIAGNOSIS — K7030 Alcoholic cirrhosis of liver without ascites: Secondary | ICD-10-CM | POA: Diagnosis not present

## 2019-02-27 DIAGNOSIS — Z9181 History of falling: Secondary | ICD-10-CM | POA: Diagnosis not present

## 2019-02-27 DIAGNOSIS — D72829 Elevated white blood cell count, unspecified: Secondary | ICD-10-CM | POA: Diagnosis not present

## 2019-02-27 DIAGNOSIS — M5412 Radiculopathy, cervical region: Secondary | ICD-10-CM | POA: Diagnosis not present

## 2019-02-28 ENCOUNTER — Telehealth: Payer: Self-pay | Admitting: Internal Medicine

## 2019-02-28 ENCOUNTER — Other Ambulatory Visit: Payer: Self-pay | Admitting: *Deleted

## 2019-02-28 NOTE — Patient Outreach (Signed)
[facility]) Care Management  02/28/2019  Patrick Kidd 05/18/1945 [id number]   Referral received from post acute care coordinator as member was recently discharged from rehab.  Per chart, he has history of alcoholic cirrhosis, PVD, HTN, WPW, GERD, DM, BPH, and Covid 19 infection.  Call placed to member, he report he already has assistance with home health and [facility].  Educated on the difference between [facility] and other agencies, member state "I don't need that much management, I'm fine."  Will close case as member declines to participate.  Patrick Kidd, [place], MSN [place] [phone]

## 2019-02-28 NOTE — Telephone Encounter (Signed)
Last DPR on file: 2017 - okay to speak to wife [name].   Pt spouse stated that she wanted to know which metformin to take. I informed spouse that 1000mg  in the morning was the only metformin dose and sig on pt med list. [name] stated that pt had an rx for penicillin from the dentist. They finished the 1st rx and then asked for a refill. They picked up refill but then pt went into the hospital. She wanted to know if he should restart. I informed [name] that if he was not having trouble, no. Pt should seek the advise of the dentist if it starts to bother him again. [name] stated that the hospital on ciprofloxacin and pt is still taking that.

## 2019-02-28 NOTE — Telephone Encounter (Signed)
Pt's wife called in stating that pt is taking Metformin 1000MG s a day, she states that he was also taking the Metformin time release she wanted to know if he should be taking both of them together? She also states that pt is on Cipro and penicillin she wanted to know if he should continue taking the Penicillin. Please call [name] at [phone]

## 2019-03-01 ENCOUNTER — Telehealth: Payer: Self-pay

## 2019-03-01 DIAGNOSIS — K652 Spontaneous bacterial peritonitis: Secondary | ICD-10-CM | POA: Diagnosis not present

## 2019-03-01 DIAGNOSIS — I10 Essential (primary) hypertension: Secondary | ICD-10-CM | POA: Diagnosis not present

## 2019-03-01 DIAGNOSIS — K766 Portal hypertension: Secondary | ICD-10-CM | POA: Diagnosis not present

## 2019-03-01 DIAGNOSIS — K810 Acute cholecystitis: Secondary | ICD-10-CM | POA: Diagnosis not present

## 2019-03-01 DIAGNOSIS — K7030 Alcoholic cirrhosis of liver without ascites: Secondary | ICD-10-CM | POA: Diagnosis not present

## 2019-03-01 DIAGNOSIS — E1151 Type 2 diabetes mellitus with diabetic peripheral angiopathy without gangrene: Secondary | ICD-10-CM | POA: Diagnosis not present

## 2019-03-01 NOTE — Telephone Encounter (Signed)
monique from [facility] requesting for pt to have assistance for 2 times a week for 5 weeks and one time a week for 2 weeks for strengthen, balance,home exercise and home safety. Best contact number for patient is [phone]

## 2019-03-01 NOTE — Telephone Encounter (Signed)
Verbal okay for PT as requested.

## 2019-03-02 ENCOUNTER — Telehealth: Payer: Self-pay

## 2019-03-02 DIAGNOSIS — K766 Portal hypertension: Secondary | ICD-10-CM | POA: Diagnosis not present

## 2019-03-02 DIAGNOSIS — K810 Acute cholecystitis: Secondary | ICD-10-CM | POA: Diagnosis not present

## 2019-03-02 DIAGNOSIS — E1151 Type 2 diabetes mellitus with diabetic peripheral angiopathy without gangrene: Secondary | ICD-10-CM | POA: Diagnosis not present

## 2019-03-02 DIAGNOSIS — K7030 Alcoholic cirrhosis of liver without ascites: Secondary | ICD-10-CM | POA: Diagnosis not present

## 2019-03-02 DIAGNOSIS — I10 Essential (primary) hypertension: Secondary | ICD-10-CM | POA: Diagnosis not present

## 2019-03-02 DIAGNOSIS — K652 Spontaneous bacterial peritonitis: Secondary | ICD-10-CM | POA: Diagnosis not present

## 2019-03-02 NOTE — Telephone Encounter (Signed)
New message   Need pre approval   1. Which medications need to be refilled? (please list name of each medication and dose if known) rifaximin (XIFAXAN) 550 MG TABS tablet  2. Which pharmacy/location (including street and city if local pharmacy) is medication to be sent to? [facility] on [address]   3. Do they need a 30 day or 90 day supply? 90 days supply

## 2019-03-02 NOTE — Telephone Encounter (Signed)
New message    1. Patient having C/o when he stands up  10 min or more sharpe pain based of the spine, no lightheadedness no dizziness   2. The wife's concern on pneumonia level is.  3. Should Home Health draw lab work before appt  03/14/19.   4. Last A1c was done last Summer.    Please advise

## 2019-03-02 NOTE — Telephone Encounter (Signed)
Are you okay with Home Health drawing blood before his appointment? Please advise  Spoke with Elmyra Ricks from home health. She wants to know if they could draw blood from pt before his appointment on 2/8. Elmyra Ricks stated pt has been having sharp pains at the base of spine and may need xray. Pt has also been dizzy and lightheaded when standing up for 10 minutes or more. Wife was having difficulties getting the potassium packet prescription but I called pharmacy and she stated pt came and picked up today.

## 2019-03-03 ENCOUNTER — Telehealth: Payer: Self-pay

## 2019-03-03 ENCOUNTER — Encounter: Payer: Self-pay | Admitting: Internal Medicine

## 2019-03-03 DIAGNOSIS — K652 Spontaneous bacterial peritonitis: Secondary | ICD-10-CM | POA: Diagnosis not present

## 2019-03-03 DIAGNOSIS — I10 Essential (primary) hypertension: Secondary | ICD-10-CM | POA: Diagnosis not present

## 2019-03-03 DIAGNOSIS — K810 Acute cholecystitis: Secondary | ICD-10-CM | POA: Diagnosis not present

## 2019-03-03 DIAGNOSIS — K7030 Alcoholic cirrhosis of liver without ascites: Secondary | ICD-10-CM | POA: Diagnosis not present

## 2019-03-03 DIAGNOSIS — E1151 Type 2 diabetes mellitus with diabetic peripheral angiopathy without gangrene: Secondary | ICD-10-CM | POA: Diagnosis not present

## 2019-03-03 DIAGNOSIS — K766 Portal hypertension: Secondary | ICD-10-CM | POA: Diagnosis not present

## 2019-03-03 NOTE — Telephone Encounter (Signed)
I will order labs when I see him  TJ

## 2019-03-03 NOTE — Telephone Encounter (Signed)
lvm with Erline Levine. Details given with verbal okay to continue palliative care.   Can speak to Marzetta Board if she calls back.

## 2019-03-03 NOTE — Telephone Encounter (Signed)
Contacted spouse and she stated that she if very upset that the PA has not been done already. She stated that pharmacist told her he sent it last week and that I should have already had it done. I informed her that I had not or it would have been at least started by now.   Patrick Kidd stated that she wanted to know what she is suppose to do about getting this medication, it was very expensive. I did have to ask Patrick Kidd to stop talking for a moment because I wanted to offer a resource but was not given an opportunity to speak.  Informed that if they had to pay out of pocket and the PA is approved then the rx payer should be able to reimburse the amount they paid for the rx.   Below is the key for the Xifaxin PA for pt.   KeyEP:[id number]  Reviewed the Covermymeds again and the PA was sent to me today at 7:47am.   While typing up this note. PA came back approved.  Called Patrick Kidd and informed of same.

## 2019-03-03 NOTE — Telephone Encounter (Signed)
    Spouse calling to check the status of medication refill. Patient has not had medication since discharged from the hospital

## 2019-03-03 NOTE — Telephone Encounter (Signed)
[facility] [phone] OPT 2.  Marzetta Board provides palliative care; req verbal order to continue following pt at home. Pt no longer at [employer] pt is home.

## 2019-03-03 NOTE — Telephone Encounter (Signed)
   Please return call to [facility] nurse. Call [phone]

## 2019-03-04 DIAGNOSIS — M25561 Pain in right knee: Secondary | ICD-10-CM | POA: Diagnosis not present

## 2019-03-04 NOTE — Telephone Encounter (Signed)
Notified Patrick Kidd w/MD response.Marland KitchenJohny Kidd

## 2019-03-07 ENCOUNTER — Telehealth: Payer: Self-pay | Admitting: Internal Medicine

## 2019-03-07 ENCOUNTER — Telehealth: Payer: Self-pay

## 2019-03-07 DIAGNOSIS — K7030 Alcoholic cirrhosis of liver without ascites: Secondary | ICD-10-CM | POA: Diagnosis not present

## 2019-03-07 DIAGNOSIS — K810 Acute cholecystitis: Secondary | ICD-10-CM | POA: Diagnosis not present

## 2019-03-07 DIAGNOSIS — K652 Spontaneous bacterial peritonitis: Secondary | ICD-10-CM | POA: Diagnosis not present

## 2019-03-07 DIAGNOSIS — I10 Essential (primary) hypertension: Secondary | ICD-10-CM | POA: Diagnosis not present

## 2019-03-07 DIAGNOSIS — E1151 Type 2 diabetes mellitus with diabetic peripheral angiopathy without gangrene: Secondary | ICD-10-CM | POA: Diagnosis not present

## 2019-03-07 DIAGNOSIS — K766 Portal hypertension: Secondary | ICD-10-CM | POA: Diagnosis not present

## 2019-03-07 NOTE — Telephone Encounter (Signed)
Home Health OT orders   1x1 2x3  [phone] - Sharyn Lull

## 2019-03-07 NOTE — Telephone Encounter (Signed)
Verbal orders given  

## 2019-03-07 NOTE — Telephone Encounter (Signed)
Telephone call to patients wife [name] to schedule palliative care visit.  [name] in agreement with team making home visit on 03/09/19 at 11:00 AM.

## 2019-03-08 DIAGNOSIS — K219 Gastro-esophageal reflux disease without esophagitis: Secondary | ICD-10-CM

## 2019-03-08 DIAGNOSIS — E1142 Type 2 diabetes mellitus with diabetic polyneuropathy: Secondary | ICD-10-CM | POA: Diagnosis not present

## 2019-03-08 DIAGNOSIS — Z8616 Personal history of COVID-19: Secondary | ICD-10-CM

## 2019-03-08 DIAGNOSIS — D72829 Elevated white blood cell count, unspecified: Secondary | ICD-10-CM | POA: Diagnosis not present

## 2019-03-08 DIAGNOSIS — E1151 Type 2 diabetes mellitus with diabetic peripheral angiopathy without gangrene: Secondary | ICD-10-CM | POA: Diagnosis not present

## 2019-03-08 DIAGNOSIS — K810 Acute cholecystitis: Secondary | ICD-10-CM | POA: Diagnosis not present

## 2019-03-08 DIAGNOSIS — M5412 Radiculopathy, cervical region: Secondary | ICD-10-CM | POA: Diagnosis not present

## 2019-03-08 DIAGNOSIS — K7030 Alcoholic cirrhosis of liver without ascites: Secondary | ICD-10-CM | POA: Diagnosis not present

## 2019-03-08 DIAGNOSIS — K766 Portal hypertension: Secondary | ICD-10-CM | POA: Diagnosis not present

## 2019-03-08 DIAGNOSIS — Z9181 History of falling: Secondary | ICD-10-CM

## 2019-03-08 DIAGNOSIS — K652 Spontaneous bacterial peritonitis: Secondary | ICD-10-CM | POA: Diagnosis not present

## 2019-03-08 DIAGNOSIS — I10 Essential (primary) hypertension: Secondary | ICD-10-CM | POA: Diagnosis not present

## 2019-03-08 DIAGNOSIS — I456 Pre-excitation syndrome: Secondary | ICD-10-CM | POA: Diagnosis not present

## 2019-03-09 ENCOUNTER — Other Ambulatory Visit: Payer: Self-pay

## 2019-03-09 ENCOUNTER — Other Ambulatory Visit: Payer: Medicare Other

## 2019-03-09 DIAGNOSIS — Z515 Encounter for palliative care: Secondary | ICD-10-CM

## 2019-03-09 NOTE — Progress Notes (Signed)
PATIENT NAME: Patrick Kidd DOB: 1945-09-27 MRN: [id number]  PRIMARY CARE PROVIDER: [name], MD  RESPONSIBLE PARTY:  Acct ID - Guarantor Home Phone Work Phone Relationship Acct Type  [id number] - [place],[id number] Self P/F     [address], [place], [place] [address]    PLAN OF CARE and INTERVENTIONS:               1.  GOALS OF CARE/ ADVANCE CARE PLANNING:  Patient wants to remain at home and get stronger.               2.  PATIENT/CAREGIVER EDUCATION:  Education on fall precautions, education on s/s of infection, reviewed meds, support               3. DISEASE STATUS: SW and RN made scheduled palliative care home care visit. Palliative care team met with patient and his wife Patrick Kidd. Patient returned home from [facility] care after being there for Rehab. Patient currently receiving nursing, PT and OT services through [facility] home [facility]. Patient reports he is doing much better and feels stronger since returning home. Patients goal is to be able to ambulate without walker. Wife reports patient is doing well with his walking and patient is planning to take a short walk outside today. Patient denies pain at the present time but reports that if he stands without walker for more than 10 minutes his back goes out. Patient states he has informed PT about this. Patient has appointment with primary care provider next week. Patient reports his appetite has improved since returning home. Patient's blood sugar this morning 147. Patient's current weight 220 pounds. Patient remains on Lasix and Potassium. Patient denies having any shortness of breath but continues to have a chronic cough. Patient reports he has had cough for 8 weeks and it comes and goes. Patient reports cough is dry. Patients breath sounds has scattered rhonchi throughout. Patient reports he has been sleeping well at night and wife confirms this. Patient remains on lactulose bid and reports he has a bowel  movement everyday. Patient has no open areas of skin breakdown. Nurse reviewed patient's medications with wife. Social worker completed MOST form and willl notified nurse practitioner to sign order and mail to patient. Patient and wife remain in agreement with palliative care services and both were encouraged to contact palliative care with questions or concerns.     HISTORY OF PRESENT ILLNESS:  Patient is a 74 year old male who resides at home with his wife.  Patient recently discharged home form rehab facility.  Patient being followed by palliative care and is seen monthly and PRN.  CODE STATUS: Full Code  ADVANCED DIRECTIVES: N MOST FORM: No PPS: 50%   PHYSICAL EXAM:   VITALS: Today's Vitals   03/09/19 1119  Weight: 220 lb (99.8 kg)  Height: 6' 2"  (1.88 m)  PainSc: 0-No pain    LUNGS: scattered rhonchi bilaterally CARDIAC: Cor RRR and Cor Tachy  EXTREMITIES: Trace edema SKIN: Skin color, texture, turgor normal. No rashes or lesions  NEURO: positive for gait problems       Nilda Simmer, RN

## 2019-03-09 NOTE — Progress Notes (Signed)
COMMUNITY PALLIATIVE CARE SW NOTE  PATIENT NAME: Patrick Kidd DOB: 1945/02/14 MRN: [id number]  PRIMARY CARE PROVIDER: [name], MD  RESPONSIBLE PARTY:  Acct ID - Guarantor Home Phone Work Phone Relationship Acct Type  [id number] - [place],[phone] Self P/F     [address], [place], [place] [address]     PLAN OF CARE and INTERVENTIONS:             1. GOALS OF CARE/ ADVANCE CARE PLANNING:  Patient is a FULL CODE. MOST form completed with patient, requested NP to review and sign. Goal is for patient to walk outside with his cane, and continue to gain strength.  2. SOCIAL/EMOTIONAL/SPIRITUAL ASSESSMENT/ INTERVENTIONS:  SW and RN met with patient and [name] (patient's wife) in the home. Patient reports that he is doing "good". Patient returned home on 1/22. Patient reports no pain but does have pain in his back when he stands too long. Patient said his appetite is good. Patient is sleeping well. Discussed medications, patient wants to stop taking so many medications and team encouraged continued discussion with MD. SW and RN provided education to wife about palliative care services. SW provided emotional support, discussed care goals and used active and reflective listening.  3. PATIENT/CAREGIVER EDUCATION/ COPING:  Patient is alert, oriented. Patient is glad to be home. Patient expresses feelings with team. Family is supportive.  4. PERSONAL EMERGENCY PLAN:  Family will call 9-1-1 for emergencies.  5. COMMUNITY RESOURCES COORDINATION/ HEALTH CARE NAVIGATION:  Patient and [name] manage care. Patient is receiving home health with [facility] -- PT, OT and RN. Patient has a PCP follow-up on Monday. Patient has GI follow-up on 2/11. Patient has new cardiology  6. FINANCIAL/LEGAL CONCERNS/INTERVENTIONS:  None.  SOCIAL HX:  Social History   Tobacco Use  . Smoking status: Former Smoker    Packs/day: 2.00    Years: 6.00    Pack years: 12.00    Types: Cigarettes   Quit date: 02/04/1971    Years since quitting: 48.1  . Smokeless tobacco: Never Used  . Tobacco comment: smoked age [age], up to 2 ppd  Substance Use Topics  . Alcohol use: Yes    Alcohol/week: 28.0 standard drinks    Types: 28 Shots of liquor per week    Comment: 2-3 vodka martinins a day    CODE STATUS:   Code Status: Prior (FULL) ADVANCED DIRECTIVES: N MOST FORM COMPLETE:  In progress. HOSPICE EDUCATION PROVIDED: None.  PPS: Patient is dependent of ADLs.   I spent 45 minutes with patient/family, from 11:00-11:45a providing education, support and consultation.   Margaretmary Lombard, LCSW

## 2019-03-10 ENCOUNTER — Other Ambulatory Visit: Payer: Self-pay

## 2019-03-10 ENCOUNTER — Other Ambulatory Visit: Payer: Medicare Other | Admitting: Hospice

## 2019-03-10 DIAGNOSIS — K7030 Alcoholic cirrhosis of liver without ascites: Secondary | ICD-10-CM | POA: Diagnosis not present

## 2019-03-10 DIAGNOSIS — K810 Acute cholecystitis: Secondary | ICD-10-CM | POA: Diagnosis not present

## 2019-03-10 DIAGNOSIS — Z515 Encounter for palliative care: Secondary | ICD-10-CM

## 2019-03-10 DIAGNOSIS — K766 Portal hypertension: Secondary | ICD-10-CM | POA: Diagnosis not present

## 2019-03-10 DIAGNOSIS — E1151 Type 2 diabetes mellitus with diabetic peripheral angiopathy without gangrene: Secondary | ICD-10-CM | POA: Diagnosis not present

## 2019-03-10 DIAGNOSIS — I10 Essential (primary) hypertension: Secondary | ICD-10-CM | POA: Diagnosis not present

## 2019-03-10 DIAGNOSIS — K652 Spontaneous bacterial peritonitis: Secondary | ICD-10-CM | POA: Diagnosis not present

## 2019-03-10 NOTE — Progress Notes (Signed)
[facility] Palliative Care Consult Note Telephone: [phone]  Fax: [phone]  PATIENT NAME: Patrick Kidd DOB: July 11, 1945 MRN: [id number]  PRIMARY CARE PROVIDER:   [name], MD  REFERRING PROVIDER:  [name], MD [place],  [place] [address]  RESPONSIBLE PARTY:     TELEHEALTH VISIT STATEMENT Due to the COVID-19 crisis, this visit was done via telephone from my office. It was initiated and consented to by this patient and/or family.  RECOMMENDATIONS/PLAN:   Advance Care Planning/Goals of Care: Telehealth Visit occasioned by request from [facility] SW to sign MOSTform  for patient who is under the PMPM program. Patient is articulate,  affirmed he is a Full code and MOST selections include to attempt CPR, Full scope of treatment, use of antibiotics/IV fluid if indicated and feeding tube for a defined trial period. MOST form signed electronically today. SW to print and mail a copy to patient.  Follow up: Palliative care will continue to follow patient for goals of care clarification and symptom management. I spent 30  minutes providing this consultation.  More than 50% of the time in this consultation was spent on coordinating communication  CODE STATUS: Full  PPS: 50% HOSPICE ELIGIBILITY/DIAGNOSIS: TBD  PAST MEDICAL HISTORY:  Past Medical History:  Diagnosis Date  . Acrophobia   . Alcoholic cirrhosis ([facility]) 3/53/2992  . Anemia   . Arthritis    foot by big toe  . BPH associated with nocturia   . Cataract    removed both eyes  . Chronic cough    PMH of  . Diabetes mellitus without complication ([facility])   . Diverticulosis 07-03-2010   Colonoscopy.   . Duodenal ulcer 2017  . Fallen arches    Bilateral  . GERD (gastroesophageal reflux disease)   . Gout   . Granuloma annulare   . Hx of adenomatous colonic polyps multiple  . Hydrocele 2011   Large septated right hydrocele  . Liver cyst   . Liver lesion   .  Nonspecific elevation of levels of transaminase or lactic acid dehydrogenase (LDH)   . Obesity   . Peripheral neuropathy   . Plantar fasciitis    PMH of  . Portal hypertension ([facility]) 2017  . Prostate cancer ([facility])   . Right shoulder pain 11/2017  . Sleep apnea    no cpap, patient denies  . Wears reading eyeglasses   . WPW (Wolff-Parkinson-White syndrome) 02/05/2019    SOCIAL HX:  Social History   Tobacco Use  . Smoking status: Former Smoker    Packs/day: 2.00    Years: 6.00    Pack years: 12.00    Types: Cigarettes    Quit date: 02/04/1971    Years since quitting: 48.1  . Smokeless tobacco: Never Used  . Tobacco comment: smoked age [age], up to 2 ppd  Substance Use Topics  . Alcohol use: Yes    Alcohol/week: 28.0 standard drinks    Types: 28 Shots of liquor per week    Comment: 2-3 vodka martinins a day    ALLERGIES:  Allergies  Allergen Reactions  . 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  Use with caution  . Calcium Channel Blockers     Likely WPW.  Use with caution.     PERTINENT MEDICATIONS:  Outpatient Encounter Medications as of 03/10/2019  Medication Sig  . benzonatate (TESSALON) 200 MG capsule Take 1 capsule (200 mg total) by mouth 3 (three) times  daily as needed for cough.  . blood glucose meter kit and supplies KIT Use to test blood sugar once daily. DX E11.8  . Blood Glucose Monitoring Suppl (FREESTYLE FREEDOM LITE) w/Device KIT Use to check blood sugars 2 times daily  . ciprofloxacin (CIPRO) 500 MG tablet Take 1 tablet (500 mg total) by mouth daily with breakfast. Continue taking it  . cyclobenzaprine (FLEXERIL) 5 MG tablet Take 1 to 2 tabs at bedtime if needed. (Patient not taking: Reported on 03/09/2019)  . folic acid (FOLVITE) 1 MG tablet Take 1 tablet (1 mg total) by mouth daily.  . furosemide (LASIX) 20 MG tablet Take 1 tablet (20 mg total) by mouth daily.  Marland Kitchen gabapentin (NEURONTIN) 300 MG capsule Take 1 capsule (300 mg total) by mouth 2 (two) times daily.  (Patient taking differently: Take 300 mg by mouth daily. )  . glucose blood (COOL BLOOD GLUCOSE TEST STRIPS) test strip Use to check blood sugar daily. DX: E11.8  . lactulose (CHRONULAC) 10 GM/15ML solution Take 15 mLs (10 g total) by mouth 2 (two) times daily.  . Lancets (ACCU-CHEK MULTICLIX) lancets Use to check sugar daily. DX: E11.8  . lisinopril (ZESTRIL) 10 MG tablet Take 1 tablet (10 mg total) by mouth daily.  . metFORMIN (GLUMETZA) 1000 MG (MOD) 24 hr tablet Take 1 tablet (1,000 mg total) by mouth daily with breakfast.  . Multiple Vitamin (MULTIVITAMIN WITH MINERALS) TABS tablet Take 1 tablet by mouth daily.  . pantoprazole (PROTONIX) 40 MG tablet TAKE 1 TABLET(40 MG) BY MOUTH TWICE DAILY (Patient taking differently: Take 40 mg by mouth 2 (two) times daily. )  . potassium chloride SA (KLOR-CON) 20 MEQ tablet Take 1 tablet (20 mEq total) by mouth daily.  . rifaximin (XIFAXAN) 550 MG TABS tablet Take 1 tablet (550 mg total) by mouth 2 (two) times daily.  Marland Kitchen thiamine 100 MG tablet Take 1 tablet (100 mg total) by mouth daily.   No facility-administered encounter medications on file as of 03/10/2019.    Teodoro Spray, NP

## 2019-03-14 ENCOUNTER — Ambulatory Visit (INDEPENDENT_AMBULATORY_CARE_PROVIDER_SITE_OTHER): Payer: Medicare Other | Admitting: Internal Medicine

## 2019-03-14 ENCOUNTER — Encounter: Payer: Self-pay | Admitting: Internal Medicine

## 2019-03-14 ENCOUNTER — Telehealth: Payer: Self-pay | Admitting: Internal Medicine

## 2019-03-14 ENCOUNTER — Other Ambulatory Visit: Payer: Self-pay

## 2019-03-14 ENCOUNTER — Telehealth: Payer: Self-pay

## 2019-03-14 VITALS — BP 118/64 | HR 110 | Temp 97.5°F | Ht 74.0 in | Wt 224.0 lb

## 2019-03-14 DIAGNOSIS — K652 Spontaneous bacterial peritonitis: Secondary | ICD-10-CM | POA: Diagnosis not present

## 2019-03-14 DIAGNOSIS — K766 Portal hypertension: Secondary | ICD-10-CM | POA: Diagnosis not present

## 2019-03-14 DIAGNOSIS — Z794 Long term (current) use of insulin: Secondary | ICD-10-CM | POA: Diagnosis not present

## 2019-03-14 DIAGNOSIS — K810 Acute cholecystitis: Secondary | ICD-10-CM | POA: Diagnosis not present

## 2019-03-14 DIAGNOSIS — E118 Type 2 diabetes mellitus with unspecified complications: Secondary | ICD-10-CM

## 2019-03-14 DIAGNOSIS — Z23 Encounter for immunization: Secondary | ICD-10-CM | POA: Diagnosis not present

## 2019-03-14 DIAGNOSIS — F04 Amnestic disorder due to known physiological condition: Secondary | ICD-10-CM | POA: Diagnosis not present

## 2019-03-14 DIAGNOSIS — I10 Essential (primary) hypertension: Secondary | ICD-10-CM

## 2019-03-14 DIAGNOSIS — D696 Thrombocytopenia, unspecified: Secondary | ICD-10-CM

## 2019-03-14 DIAGNOSIS — K7030 Alcoholic cirrhosis of liver without ascites: Secondary | ICD-10-CM | POA: Diagnosis not present

## 2019-03-14 DIAGNOSIS — E785 Hyperlipidemia, unspecified: Secondary | ICD-10-CM | POA: Diagnosis not present

## 2019-03-14 DIAGNOSIS — E538 Deficiency of other specified B group vitamins: Secondary | ICD-10-CM

## 2019-03-14 DIAGNOSIS — E1151 Type 2 diabetes mellitus with diabetic peripheral angiopathy without gangrene: Secondary | ICD-10-CM | POA: Diagnosis not present

## 2019-03-14 LAB — URINALYSIS, ROUTINE W REFLEX MICROSCOPIC
Bilirubin Urine: NEGATIVE
Hgb urine dipstick: NEGATIVE
Ketones, ur: NEGATIVE
Leukocytes,Ua: NEGATIVE
Nitrite: NEGATIVE
Specific Gravity, Urine: 1.02 (ref 1.000–1.030)
Total Protein, Urine: NEGATIVE
Urine Glucose: NEGATIVE
Urobilinogen, UA: 0.2 (ref 0.0–1.0)
pH: 6 (ref 5.0–8.0)

## 2019-03-14 LAB — BASIC METABOLIC PANEL
BUN: 9 mg/dL (ref 6–23)
CO2: 28 mEq/L (ref 19–32)
Calcium: 9.5 mg/dL (ref 8.4–10.5)
Chloride: 100 mEq/L (ref 96–112)
Creatinine, Ser: 0.85 mg/dL (ref 0.40–1.50)
GFR: 88.02 mL/min (ref >60.00)
Glucose, Bld: 194 mg/dL — ABNORMAL HIGH (ref 70–99)
Potassium: 4.6 mEq/L (ref 3.5–5.1)
Sodium: 136 mEq/L (ref 135–145)

## 2019-03-14 LAB — CBC WITH DIFFERENTIAL/PLATELET
Basophils Absolute: 0 10*3/uL (ref 0.0–0.1)
Basophils Relative: 0.8 % (ref 0.0–3.0)
Eosinophils Absolute: 0.1 10*3/uL (ref 0.0–0.7)
Eosinophils Relative: 2 % (ref 0.0–5.0)
HCT: 36.6 % — ABNORMAL LOW (ref 39.0–52.0)
Hemoglobin: 12.4 g/dL — ABNORMAL LOW (ref 13.0–17.0)
Lymphocytes Relative: 36.8 % (ref 12.0–46.0)
Lymphs Abs: 2.1 10*3/uL (ref 0.7–4.0)
MCHC: 33.8 g/dL (ref 30.0–36.0)
MCV: 94.9 fl (ref 78.0–100.0)
Monocytes Absolute: 0.4 10*3/uL (ref 0.1–1.0)
Monocytes Relative: 6.9 % (ref 3.0–12.0)
Neutro Abs: 3 10*3/uL (ref 1.4–7.7)
Neutrophils Relative %: 53.5 % (ref 43.0–77.0)
Platelets: 128 10*3/uL — ABNORMAL LOW (ref 150.0–400.0)
RBC: 3.85 Mil/uL — ABNORMAL LOW (ref 4.22–5.81)
RDW: 13.8 % (ref 11.5–15.5)
WBC: 5.7 10*3/uL (ref 4.0–10.5)

## 2019-03-14 LAB — MICROALBUMIN / CREATININE URINE RATIO
Creatinine,U: 121.8 mg/dL
Microalb Creat Ratio: 2.8 mg/g (ref 0.0–30.0)
Microalb, Ur: 3.4 mg/dL — ABNORMAL HIGH (ref 0.0–1.9)

## 2019-03-14 LAB — HEPATIC FUNCTION PANEL
ALT: 8 U/L (ref 0–53)
AST: 17 U/L (ref 0–37)
Albumin: 3.5 g/dL (ref 3.5–5.2)
Alkaline Phosphatase: 107 U/L (ref 39–117)
Bilirubin, Direct: 0.5 mg/dL — ABNORMAL HIGH (ref 0.0–0.3)
Total Bilirubin: 1.3 mg/dL — ABNORMAL HIGH (ref 0.2–1.2)
Total Protein: 6.7 g/dL (ref 6.0–8.3)

## 2019-03-14 LAB — LIPID PANEL
Cholesterol: 153 mg/dL (ref 0–200)
HDL: 45 mg/dL (ref >39.00)
LDL Cholesterol: 81 mg/dL (ref 0–99)
NonHDL: 108.04
Total CHOL/HDL Ratio: 3
Triglycerides: 134 mg/dL (ref 0.0–149.0)
VLDL: 26.8 mg/dL (ref 0.0–40.0)

## 2019-03-14 LAB — PROTIME-INR
INR: 1.1 ratio — ABNORMAL HIGH (ref 0.8–1.0)
Prothrombin Time: 12.1 s (ref 9.6–13.1)

## 2019-03-14 LAB — VITAMIN B12: Vitamin B-12: 338 pg/mL (ref 211–911)

## 2019-03-14 LAB — FOLATE: Folate: 21.3 ng/mL (ref >5.9)

## 2019-03-14 MED ORDER — METFORMIN HCL ER (MOD) 1000 MG PO TB24: 1000.0000 mg | ORAL_TABLET | Freq: Two times a day (BID) | ORAL | 1 refills | Status: DC

## 2019-03-14 NOTE — Telephone Encounter (Signed)
    Patient received flu shot today, scheduled for Covid vaccine 02/10. Patient wants to know if he needs to wait longer in between the vaccinations   Please call

## 2019-03-14 NOTE — Telephone Encounter (Signed)
Called pt and informed per last conversation, PCP is okay with pt getting the COVID vaccine 2 days after PNA23 vaccine.

## 2019-03-14 NOTE — Patient Instructions (Signed)
Anemia  Anemia is a condition in which you do not have enough red blood cells or hemoglobin. Hemoglobin is a substance in red blood cells that carries oxygen. When you do not have enough red blood cells or hemoglobin (are anemic), your body cannot get enough oxygen and your organs may not work properly. As a result, you may feel very tired or have other problems. What are the causes? Common causes of anemia include:  Excessive bleeding. Anemia can be caused by excessive bleeding inside or outside the body, including bleeding from the intestine or from periods in women.  Poor nutrition.  Long-lasting (chronic) kidney, thyroid, and liver disease.  Bone marrow disorders.  Cancer and treatments for cancer.  HIV (human immunodeficiency virus) and AIDS (acquired immunodeficiency syndrome).  Treatments for HIV and AIDS.  Spleen problems.  Blood disorders.  Infections, medicines, and autoimmune disorders that destroy red blood cells. What are the signs or symptoms? Symptoms of this condition include:  Minor weakness.  Dizziness.  Headache.  Feeling heartbeats that are irregular or faster than normal (palpitations).  Shortness of breath, especially with exercise.  Paleness.  Cold sensitivity.  Indigestion.  Nausea.  Difficulty sleeping.  Difficulty concentrating. Symptoms may occur suddenly or develop slowly. If your anemia is mild, you may not have symptoms. How is this diagnosed? This condition is diagnosed based on:  Blood tests.  Your medical history.  A physical exam.  Bone marrow biopsy. Your health care provider may also check your stool (feces) for blood and may do additional testing to look for the cause of your bleeding. You may also have other tests, including:  Imaging tests, such as a CT scan or MRI.  Endoscopy.  Colonoscopy. How is this treated? Treatment for this condition depends on the cause. If you continue to lose a lot of blood, you may  need to be treated at a hospital. Treatment may include:  Taking supplements of iron, vitamin S31, or folic acid.  Taking a hormone medicine (erythropoietin) that can help to stimulate red blood cell growth.  Having a blood transfusion. This may be needed if you lose a lot of blood.  Making changes to your diet.  Having surgery to remove your spleen. Follow these instructions at home:  Take over-the-counter and prescription medicines only as told by your health care provider.  Take supplements only as told by your health care provider.  Follow any diet instructions that you were given.  Keep all follow-up visits as told by your health care provider. This is important. Contact a health care provider if:  You develop new bleeding anywhere in the body. Get help right away if:  You are very weak.  You are short of breath.  You have pain in your abdomen or chest.  You are dizzy or feel faint.  You have trouble concentrating.  You have bloody or black, tarry stools.  You vomit repeatedly or you vomit up blood. Summary  Anemia is a condition in which you do not have enough red blood cells or enough of a substance in your red blood cells that carries oxygen (hemoglobin).  Symptoms may occur suddenly or develop slowly.  If your anemia is mild, you may not have symptoms.  This condition is diagnosed with blood tests as well as a medical history and physical exam. Other tests may be needed.  Treatment for this condition depends on the cause of the anemia. This information is not intended to replace advice given to you by  your health care provider. Make sure you discuss any questions you have with your health care provider. Document Revised: 01/02/2017 Document Reviewed: 02/22/2016 Elsevier Patient Education  [employer].

## 2019-03-14 NOTE — Telephone Encounter (Signed)
Error

## 2019-03-14 NOTE — Telephone Encounter (Signed)
Called pt and spoke to pt and his wife and informed okay to get the COVID vaccine.

## 2019-03-14 NOTE — Telephone Encounter (Signed)
Patient calling and states that he just received

## 2019-03-14 NOTE — Telephone Encounter (Signed)
Patient states that he spoke with the someone at the vaccine clinic and they suggested that he wait 2 weeks after receiving the flu shot. States that it is unsure what he should do now since he has received conflicting information. Please advise.

## 2019-03-14 NOTE — Progress Notes (Signed)
Subjective:  Patient ID: Patrick Kidd, male    DOB: 1945-06-14  Age: 74 y.o. MRN: [id number]  CC: Hyperlipidemia, Diabetes, and Anemia  This visit occurred during the SARS-CoV-2 public health emergency.  Safety protocols were in place, including screening questions prior to the visit, additional usage of staff PPE, and extensive cleaning of exam room while observing appropriate contact time as indicated for disinfecting solutions.    HPI Patrick Kidd presents for f/up - He was admitted a few weeks ago for SBP and HE. He has had a stay in rehab and tells me that he is doing much better. He uses a walker now. He is compliant with meds for HE.   Outpatient Medications Prior to Visit  Medication Sig Dispense Refill  . benzonatate (TESSALON) 200 MG capsule Take 1 capsule (200 mg total) by mouth 3 (three) times daily as needed for cough. 20 capsule 0  . blood glucose meter kit and supplies KIT Use to test blood sugar once daily. DX E11.8 1 each 0  . Blood Glucose Monitoring Suppl (FREESTYLE FREEDOM LITE) w/Device KIT Use to check blood sugars 2 times daily 1 each 1  . ciprofloxacin (CIPRO) 500 MG tablet Take 1 tablet (500 mg total) by mouth daily with breakfast. Continue taking it 60 tablet 0  . cyclobenzaprine (FLEXERIL) 5 MG tablet Take 1 to 2 tabs at bedtime if needed. 30 tablet 1  . folic acid (FOLVITE) 1 MG tablet Take 1 tablet (1 mg total) by mouth daily.  3  . furosemide (LASIX) 20 MG tablet Take 1 tablet (20 mg total) by mouth daily. 30 tablet   . gabapentin (NEURONTIN) 300 MG capsule Take 1 capsule (300 mg total) by mouth 2 (two) times daily. (Patient taking differently: Take 300 mg by mouth daily. ) 180 capsule 3  . glucose blood (COOL BLOOD GLUCOSE TEST STRIPS) test strip Use to check blood sugar daily. DX: E11.8 100 each 12  . lactulose (CHRONULAC) 10 GM/15ML solution Take 15 mLs (10 g total) by mouth 2 (two) times daily. 236 mL 0  . Lancets (ACCU-CHEK MULTICLIX) lancets  Use to check sugar daily. DX: E11.8 100 each 12  . lisinopril (ZESTRIL) 10 MG tablet Take 1 tablet (10 mg total) by mouth daily.    . Multiple Vitamin (MULTIVITAMIN WITH MINERALS) TABS tablet Take 1 tablet by mouth daily.    . pantoprazole (PROTONIX) 40 MG tablet TAKE 1 TABLET(40 MG) BY MOUTH TWICE DAILY (Patient taking differently: Take 40 mg by mouth 2 (two) times daily. ) 180 tablet 0  . potassium chloride SA (KLOR-CON) 20 MEQ tablet Take 1 tablet (20 mEq total) by mouth daily.    . rifaximin (XIFAXAN) 550 MG TABS tablet Take 1 tablet (550 mg total) by mouth 2 (two) times daily. 42 tablet   . thiamine 100 MG tablet Take 1 tablet (100 mg total) by mouth daily.    . metFORMIN (GLUMETZA) 1000 MG (MOD) 24 hr tablet Take 1 tablet (1,000 mg total) by mouth daily with breakfast.    . COVID-19 mRNA vaccine, Moderna, 100 MCG/0.5ML SUSP Moderna COVID-19 Vaccine (PF) 100 mcg/0.5 mL intramuscular susp. (EUA)  PHARMACY ADMINISTERED    . lactulose, encephalopathy, (GENERLAC) 10 GM/15ML SOLN Generlac 10 gram/15 mL oral solution  TAKE 15 ML BY MOUTH TWICE DAILY    . penicillin v potassium (VEETID) 500 MG tablet penicillin V potassium 500 mg tablet  TAKE 2 TABLETS BY MOUTH NOW THEN 1 TABLET BY MOUTH FOUR  TIMES DAILY UNTIL GONE    . potassium chloride (KLOR-CON) 20 MEQ packet Klor-Con 20 mEq oral packet  MIX 1 PACKET IN LIQUID AND TAKE BY MOUTH DAILY    . traMADol (ULTRAM) 50 MG tablet tramadol 50 mg tablet     No facility-administered medications prior to visit.    ROS Review of Systems  Constitutional: Negative for diaphoresis, fatigue and unexpected weight change.  HENT: Negative.   Eyes: Negative.   Respiratory: Negative for cough, chest tightness, shortness of breath and wheezing.   Cardiovascular: Negative for chest pain, palpitations and leg swelling.  Gastrointestinal: Negative for abdominal pain, constipation, diarrhea, nausea and vomiting.  Endocrine: Negative.  Negative for polydipsia,  polyphagia and polyuria.  Genitourinary: Negative.  Negative for difficulty urinating.  Musculoskeletal: Positive for gait problem. Negative for arthralgias.  Skin: Negative for color change and pallor.  Neurological: Positive for weakness. Negative for dizziness and numbness.  Hematological: Negative for adenopathy. Does not bruise/bleed easily.  Psychiatric/Behavioral: Negative.     Objective:  BP 118/64 (BP Location: Left Arm, Patient Position: Sitting, Cuff Size: Normal)   Pulse (!) 110   Temp (!) 97.5 F (36.4 C) (Oral)   Ht '6\' 2"'$  (1.88 m)   Wt 224 lb (101.6 kg)   SpO2 98%   BMI 28.76 kg/m   BP Readings from Last 3 Encounters:  03/14/19 118/64  02/24/19 110/70  02/09/19 (!) 146/65    Wt Readings from Last 3 Encounters:  03/14/19 224 lb (101.6 kg)  03/09/19 220 lb (99.8 kg)  02/24/19 232 lb (105.2 kg)    Physical Exam Vitals reviewed.  Constitutional:      General: He is not in acute distress.    Appearance: He is not toxic-appearing.  HENT:     Nose: Nose normal.     Mouth/Throat:     Mouth: Mucous membranes are moist.  Eyes:     General: No scleral icterus.    Conjunctiva/sclera: Conjunctivae normal.  Cardiovascular:     Rate and Rhythm: Normal rate. Occasional extrasystoles are present.    Heart sounds: No murmur.  Pulmonary:     Effort: Pulmonary effort is normal.     Breath sounds: No stridor. No wheezing, rhonchi or rales.  Abdominal:     General: Abdomen is protuberant. Bowel sounds are normal. There is no distension.     Palpations: Abdomen is soft. There is no hepatomegaly, splenomegaly or mass.     Tenderness: There is no abdominal tenderness.  Musculoskeletal:        General: Normal range of motion.     Cervical back: Neck supple.     Right lower leg: No edema.     Left lower leg: No edema.  Lymphadenopathy:     Cervical: No cervical adenopathy.  Skin:    General: Skin is warm and dry.  Neurological:     Mental Status: He is alert and  oriented to person, place, and time. Mental status is at baseline.     Gait: Gait abnormal.  Psychiatric:        Mood and Affect: Mood normal.        Behavior: Behavior normal.     Lab Results  Component Value Date   WBC 5.7 03/14/2019   HGB 12.4 (L) 03/14/2019   HCT 36.6 (L) 03/14/2019   PLT 128.0 (L) 03/14/2019   GLUCOSE 194 (H) 03/14/2019   CHOL 153 03/14/2019   TRIG 134.0 03/14/2019   HDL 45.00 03/14/2019   [place]  81 03/14/2019   ALT 8 03/14/2019   AST 17 03/14/2019   NA 136 03/14/2019   K 4.6 03/14/2019   CL 100 03/14/2019   CREATININE 0.85 03/14/2019   BUN 9 03/14/2019   CO2 28 03/14/2019   TSH 1.830 01/29/2019   PSA 0.84 10/07/2018   INR 1.1 (H) 03/14/2019   HGBA1C 8.9 (H) 01/31/2019   MICROALBUR 3.4 (H) 03/14/2019    CT Abdomen Pelvis W Contrast  Result Date: 01/28/2019 CLINICAL DATA:  Fall right flank hematoma EXAM: CT ABDOMEN AND PELVIS WITH CONTRAST TECHNIQUE: Multidetector CT imaging of the abdomen and pelvis was performed using the standard protocol following bolus administration of intravenous contrast. CONTRAST:  131m OMNIPAQUE IOHEXOL 300 MG/ML  SOLN COMPARISON:  July 27, 2018 FINDINGS: Lower chest: The visualized heart size within normal limits. No pericardial fluid/thickening. No hiatal hernia. Streaky atelectasis seen at both lung bases. Hepatobiliary: Again noted is hepatomegaly with a slightly nodular liver contour, consistent with cirrhosis. There is a low-density lesion in the posterior right liver lobe measuring 6.2 cm, unchanged from the prior CT. The gallbladder appears to be fluid-filled and mildly distended with a small amount of pericholecystic fluid and fat stranding changes. No definite layering gallstones however are present. No biliary ductal dilatation. Pancreas: Unremarkable. No pancreatic ductal dilatation or surrounding inflammatory changes. Spleen: Normal in size without focal abnormality. Adrenals/Urinary Tract: Both adrenal glands appear  normal. The kidneys and collecting system appear normal without evidence of urinary tract calculus or hydronephrosis. Bladder is unremarkable. Stomach/Bowel: The stomach, small bowel, and colon are normal in appearance. No inflammatory changes, wall thickening, or obstructive findings.Scattered colonic diverticula are noted without diverticulitis. The appendix is unremarkable. Vascular/Lymphatic: There are no enlarged mesenteric, retroperitoneal, or pelvic lymph nodes. A small amount of perihepatic ascites is noted. Scattered aortic atherosclerotic calcifications are seen without aneurysmal dilatation. Reproductive: The prostate contains radiation prostate seeds. Other: No evidence of abdominal wall mass or hernia. Musculoskeletal: No acute or significant osseous findings. IMPRESSION: Mildly distended gallbladder with a small amount of pericholecystic fluid and surrounding inflammatory changes. This could be due to cholecystitis. If further evaluation is required would recommend right upper quadrant ultrasound Hepatomegaly and findings suggestive of cirrhosis. Small perihepatic ascites Aortic Atherosclerosis (ICD10-I70.0). Unchanged 6 cm hepatic cyst. Electronically Signed   By: BPrudencio PairM.D.   On: 01/28/2019 23:23   DG Chest Port 1 View  Result Date: 01/28/2019 CLINICAL DATA:  Shortness of breath, diagnosed with COVID-19 on 01/03/2019, increased fatigue, jaundice, cirrhosis EXAM: PORTABLE CHEST 1 VIEW COMPARISON:  Portable exam 1759 hours compared to 02/19/2018 FINDINGS: Normal heart size, mediastinal contours, and pulmonary vascularity. Atherosclerotic calcification aorta. Mild LEFT basilar atelectasis. Lungs otherwise clear. No pulmonary infiltrate, pleural effusion or pneumothorax. Multilevel endplate spur formation thoracic spine. IMPRESSION: Mild LEFT basilar atelectasis. Electronically Signed   By: MLavonia DanaM.D.   On: 01/28/2019 18:17   ECHOCARDIOGRAM COMPLETE  Result Date: 01/29/2019    ECHOCARDIOGRAM REPORT   Patient Name:   LDELLAS GUARDDate of Exam: 01/29/2019 Medical Rec #:  [id number]         Height:       74.0 in Accession #:    [id number]        Weight:       235.0 lb Date of Birth:  911/19/1947         BSA:          2.33 m Patient Age:    89years  BP:           134/68 mmHg Patient Gender: M                  HR:           117 bpm. Exam Location:  Inpatient Procedure: 2D Echo and Intracardiac Opacification Agent Indications:    Elevated troponin; I10 Hypertension  History:        Patient has no prior history of Echocardiogram examinations.                 Signs/Symptoms:Altered Mental Status. Covid19.  Sonographer:    Merrie Roof RDCS Referring Phys: [place]  1. Left ventricular ejection fraction, by visual estimation, is 60 to 65%. The left ventricle has normal function. There is no left ventricular hypertrophy.  2. Definity contrast agent was given IV to delineate the left ventricular endocardial borders.  3. Left ventricular diastolic parameters are consistent with Grade I diastolic dysfunction (impaired relaxation).  4. The left ventricle has no regional wall motion abnormalities.  5. Global right ventricle has normal systolic function.The right ventricular size is normal. No increase in right ventricular wall thickness.  6. Left atrial size was normal.  7. Right atrial size was normal.  8. The mitral valve is normal in structure. No evidence of mitral valve regurgitation. No evidence of mitral stenosis.  9. The tricuspid valve is normal in structure. 10. The aortic valve is normal in structure. Aortic valve regurgitation is not visualized. No evidence of aortic valve sclerosis or stenosis. 11. The pulmonic valve was normal in structure. Pulmonic valve regurgitation is not visualized. 12. The inferior vena cava is normal in size with greater than 50% respiratory variability, suggesting right atrial pressure of 3 mmHg. FINDINGS  Left Ventricle: Left  ventricular ejection fraction, by visual estimation, is 60 to 65%. The left ventricle has normal function. Definity contrast agent was given IV to delineate the left ventricular endocardial borders. The left ventricle has no regional wall motion abnormalities. There is no left ventricular hypertrophy. Left ventricular diastolic parameters are consistent with Grade I diastolic dysfunction (impaired relaxation). Normal left atrial pressure. Right Ventricle: The right ventricular size is normal. No increase in right ventricular wall thickness. Global RV systolic function is has normal systolic function. Left Atrium: Left atrial size was normal in size. Right Atrium: Right atrial size was normal in size Pericardium: There is no evidence of pericardial effusion. Mitral Valve: The mitral valve is normal in structure. No evidence of mitral valve regurgitation. No evidence of mitral valve stenosis by observation. Tricuspid Valve: The tricuspid valve is normal in structure. Tricuspid valve regurgitation is not demonstrated. Aortic Valve: The aortic valve is normal in structure. Aortic valve regurgitation is not visualized. The aortic valve is structurally normal, with no evidence of sclerosis or stenosis. Pulmonic Valve: The pulmonic valve was normal in structure. Pulmonic valve regurgitation is not visualized. Pulmonic regurgitation is not visualized. Aorta: The aortic root, ascending aorta and aortic arch are all structurally normal, with no evidence of dilitation or obstruction. Venous: The inferior vena cava is normal in size with greater than 50% respiratory variability, suggesting right atrial pressure of 3 mmHg. IAS/Shunts: No atrial level shunt detected by color flow Doppler. There is no evidence of a patent foramen ovale. No ventricular septal defect is seen or detected. There is no evidence of an atrial septal defect.  LEFT VENTRICLE PLAX 2D LVIDd:         4.90  cm LVIDs:         3.20 cm LV PW:         1.10 cm LV  IVS:        1.10 cm LVOT diam:     1.90 cm LV SV:         72 ml LV SV Index:   30.16 LVOT Area:     2.84 cm  LEFT ATRIUM           Index LA diam:      4.10 cm 1.76 cm/m LA Vol (A2C): 23.7 ml 10.18 ml/m LA Vol (A4C): 53.9 ml 23.16 ml/m  AORTIC VALVE LVOT Vmax:   150.00 cm/s LVOT Vmean:  7010.000 cm/s  SHUNTS Systemic Diam: 1.90 cm  Candee Furbish MD Electronically signed by Candee Furbish MD Signature Date/Time: 01/29/2019/3:45:26 PM    Final    US Abdomen Limited RUQ  Result Date: 01/29/2019 CLINICAL DATA:  Jaundice EXAM: ULTRASOUND ABDOMEN LIMITED RIGHT UPPER QUADRANT COMPARISON:  None. FINDINGS: Gallbladder: Layering sludge/stones seen within gallbladder there is a edematous mildly prominent gallbladder wall measuring 3.1 mm. No sonographic Murphy sign. Common bile duct: Diameter: 3.7 mm Liver: Increased echotexture seen throughout. There is an anechoic cyst within the right liver lobe measuring 7.3 x 5.7 x 5.1 cm. The main portal vein appears patent. Other: None. IMPRESSION: Layering sludge/stones with mild gallbladder wall thickening. This could be due to mild acute cholecystitis. Electronically Signed   By: Prudencio Pair M.D.   On: 01/29/2019 01:55    Assessment & Plan:   Tavious was seen today for hyperlipidemia, diabetes and anemia.  Diagnoses and all orders for this visit:  Essential hypertension- His BP is adequately well controlled. -     CBC with Differential/Platelet -     Basic metabolic panel -     Urinalysis, Routine w reflex microscopic  Type 2 diabetes mellitus with complication, with long-term current use of insulin ([place])- His A1C is too high. I recommended that he increase the metformin dose and to add on a basal insulin. -     Microalbumin / creatinine urine ratio -     Urinalysis, Routine w reflex microscopic -     HM Diabetes Foot Exam -     metFORMIN (GLUMETZA) 1000 MG (MOD) 24 hr tablet; Take 1 tablet (1,000 mg total) by mouth 2 (two) times daily with a meal. -      Insulin Glargine, 2 Unit Dial, (TOUJEO MAX SOLOSTAR) 300 UNIT/ML SOPN; Inject 30 Units into the skin daily.  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)- I will monitor his B1 level. -     CBC with Differential/Platelet -     Vitamin B1  Thrombocytopenia ([place])- His PLT count remain mildly low. I will monitor for vit deficiencies. This is likely caused by the liver disease. -     CBC with Differential/Platelet -     Folate -     Vitamin B12  Hyperlipidemia LDL goal <100- He has an elevated ASCVD risk score so I have asked him to take a statin for CV risk reduction. -     Lipid panel  Alcoholic cirrhosis of liver without ascites ([place])- Improvement noted. -     Protime-INR -     Hepatic function panel  Need for 23-polyvalent pneumococcal polysaccharide vaccine -     Pneumococcal polysaccharide vaccine 23-valent greater than or equal to 2yo subcutaneous/IM   I have changed Patrick Kidd "Bryan"'s metFORMIN. I am  also having him start on [employer]. Additionally, I am having him maintain his FreeStyle Freedom Lite, blood glucose meter kit and supplies, glucose blood, accu-chek multiclix, pantoprazole, gabapentin, cyclobenzaprine, thiamine, benzonatate, rifaximin, multivitamin with minerals, lisinopril, furosemide, potassium chloride SA, folic acid, ciprofloxacin, lactulose, COVID-19 mRNA vaccine (Moderna), lactulose (encephalopathy), penicillin v potassium, potassium chloride, and traMADol.  Meds ordered this encounter  Medications  . metFORMIN (GLUMETZA) 1000 MG (MOD) 24 hr tablet    Sig: Take 1 tablet (1,000 mg total) by mouth 2 (two) times daily with a meal.    Dispense:  180 tablet    Refill:  1  . Insulin Glargine, 2 Unit Dial, (TOUJEO MAX SOLOSTAR) 300 UNIT/ML SOPN    Sig: Inject 30 Units into the skin daily.    Dispense:  3 mL    Refill:  3     Follow-up: Return in about 3 months (around 06/11/2019).  Scarlette Calico, MD

## 2019-03-15 ENCOUNTER — Encounter: Payer: Self-pay | Admitting: Internal Medicine

## 2019-03-15 DIAGNOSIS — K652 Spontaneous bacterial peritonitis: Secondary | ICD-10-CM | POA: Diagnosis not present

## 2019-03-15 DIAGNOSIS — K7030 Alcoholic cirrhosis of liver without ascites: Secondary | ICD-10-CM | POA: Diagnosis not present

## 2019-03-15 DIAGNOSIS — K766 Portal hypertension: Secondary | ICD-10-CM | POA: Diagnosis not present

## 2019-03-15 DIAGNOSIS — I10 Essential (primary) hypertension: Secondary | ICD-10-CM | POA: Diagnosis not present

## 2019-03-15 DIAGNOSIS — E1151 Type 2 diabetes mellitus with diabetic peripheral angiopathy without gangrene: Secondary | ICD-10-CM | POA: Diagnosis not present

## 2019-03-15 DIAGNOSIS — K810 Acute cholecystitis: Secondary | ICD-10-CM | POA: Diagnosis not present

## 2019-03-16 DIAGNOSIS — M25561 Pain in right knee: Secondary | ICD-10-CM | POA: Diagnosis not present

## 2019-03-16 DIAGNOSIS — Z23 Encounter for immunization: Secondary | ICD-10-CM | POA: Diagnosis not present

## 2019-03-16 DIAGNOSIS — S83511A Sprain of anterior cruciate ligament of right knee, initial encounter: Secondary | ICD-10-CM | POA: Diagnosis not present

## 2019-03-16 DIAGNOSIS — M1711 Unilateral primary osteoarthritis, right knee: Secondary | ICD-10-CM | POA: Diagnosis not present

## 2019-03-16 DIAGNOSIS — S83411A Sprain of medial collateral ligament of right knee, initial encounter: Secondary | ICD-10-CM | POA: Diagnosis not present

## 2019-03-16 MED ORDER — TOUJEO MAX SOLOSTAR 300 UNIT/ML ~~LOC~~ SOPN: 30.0000 [IU] | PEN_INJECTOR | Freq: Every day | SUBCUTANEOUS | 3 refills | Status: DC

## 2019-03-16 MED ORDER — ROSUVASTATIN CALCIUM 5 MG PO TABS: 5.0000 mg | ORAL_TABLET | Freq: Every day | ORAL | 1 refills | Status: DC

## 2019-03-17 ENCOUNTER — Ambulatory Visit (INDEPENDENT_AMBULATORY_CARE_PROVIDER_SITE_OTHER): Payer: Medicare Other | Admitting: Internal Medicine

## 2019-03-17 ENCOUNTER — Ambulatory Visit: Payer: Medicare Other | Admitting: Internal Medicine

## 2019-03-17 ENCOUNTER — Encounter: Payer: Self-pay | Admitting: Internal Medicine

## 2019-03-17 VITALS — BP 114/62 | HR 111 | Temp 97.6°F | Ht 74.0 in | Wt 225.0 lb

## 2019-03-17 DIAGNOSIS — E118 Type 2 diabetes mellitus with unspecified complications: Secondary | ICD-10-CM | POA: Diagnosis not present

## 2019-03-17 DIAGNOSIS — Z86010 Personal history of colonic polyps: Secondary | ICD-10-CM | POA: Diagnosis not present

## 2019-03-17 DIAGNOSIS — Z794 Long term (current) use of insulin: Secondary | ICD-10-CM

## 2019-03-17 DIAGNOSIS — K7031 Alcoholic cirrhosis of liver with ascites: Secondary | ICD-10-CM | POA: Diagnosis not present

## 2019-03-17 DIAGNOSIS — K7290 Hepatic failure, unspecified without coma: Secondary | ICD-10-CM

## 2019-03-17 DIAGNOSIS — K810 Acute cholecystitis: Secondary | ICD-10-CM | POA: Diagnosis not present

## 2019-03-17 DIAGNOSIS — K7030 Alcoholic cirrhosis of liver without ascites: Secondary | ICD-10-CM | POA: Diagnosis not present

## 2019-03-17 DIAGNOSIS — K652 Spontaneous bacterial peritonitis: Secondary | ICD-10-CM | POA: Diagnosis not present

## 2019-03-17 DIAGNOSIS — K766 Portal hypertension: Secondary | ICD-10-CM | POA: Diagnosis not present

## 2019-03-17 DIAGNOSIS — E663 Overweight: Secondary | ICD-10-CM

## 2019-03-17 DIAGNOSIS — I10 Essential (primary) hypertension: Secondary | ICD-10-CM | POA: Diagnosis not present

## 2019-03-17 DIAGNOSIS — Z1211 Encounter for screening for malignant neoplasm of colon: Secondary | ICD-10-CM

## 2019-03-17 DIAGNOSIS — Z6828 Body mass index (BMI) 28.0-28.9, adult: Secondary | ICD-10-CM | POA: Diagnosis not present

## 2019-03-17 DIAGNOSIS — E1151 Type 2 diabetes mellitus with diabetic peripheral angiopathy without gangrene: Secondary | ICD-10-CM | POA: Diagnosis not present

## 2019-03-17 DIAGNOSIS — K7682 Hepatic encephalopathy: Secondary | ICD-10-CM

## 2019-03-17 NOTE — Assessment & Plan Note (Signed)
BMI is significantly lower than it used to be.  Has probably lost some muscle mass with his alcoholism and other illnesses, uncontrolled diabetes will lead to weight loss as well.  Still has abdominal adiposity and reduction of that in his best interest.  Dietitian referral.

## 2019-03-17 NOTE — Assessment & Plan Note (Addendum)
Continue 500 mg Cipro daily for now.  Consider switching to Septra DS daily in the future. Reduce question eliminate PPI which can increase risk of SBP.  He is not sure if he is still on it twice daily I am not sure why he would be on it twice daily based upon my knowledge of the situation.  We will clarify further at at/after EGD.

## 2019-03-17 NOTE — Assessment & Plan Note (Signed)
Dietitian referral for diet education he is uncontrolled or was in the hospital.  Concerned about belly fat appropriately so, loss of abdominal fat is in his best interest.

## 2019-03-17 NOTE — Progress Notes (Signed)
Patrick Kidd 74 y.o. 09-04-1945 [id number]  Assessment & Plan:   Encounter Diagnoses  Name Primary?  . Alcoholic cirrhosis of liver with ascites ([place]) Yes  . Encephalopathy, hepatic ([place])   . Encounter for colonoscopy due to history of adenomatous colonic polyps   . SBP (spontaneous bacterial peritonitis) ([place])   . Type 2 diabetes mellitus with complication, with long-term current use of insulin ([place])   . Overweight with body mass index (BMI) of 28 to 28.9 in adult    Alcoholic cirrhosis ([place]) Significantly improved from his serious critical illness at the hospital which I think was compounded by alcohol withdrawal and perhaps a component of hepatitis at the time and obviously his SBP was a big issue.  I reviewed his current situation he has decompensated cirrhosis with a history of encephalopathy question how much and how much medication he needs at this time for that.  Continue to abstain from alcohol EGD to screen for varices Beta-blockade is contraindicated for him due to history of SBP and also he has possible Wolff-Parkinson-White and apparently he should not take beta-blockers and that situation as well.  Since no lesions on his MRI in the liver, repeat cross-sectional imaging December 2021, health status permitting.  Encephalopathy, hepatic ([place]) He is alert and oriented x3 and does not have asterixis.  I did not recheck an ammonia level as that is not really a good test to follow clinical exam is better.  I think it is reasonable to discontinue his Xifaxan and see how he does.  He was clearly more ill when hospitalized and the catabolic stress on his body is less now.  I have warned him and instructed him to watch himself and have his wife watch for confusion excessive sleepiness etc. we might need to increase his lactulose.  I gather Doreene Nest is not cost prohibitive for him though he would rather not pay $900 a month.  I think it is medically appropriate to discontinue  that and see how he does.  SBP (spontaneous bacterial peritonitis) (HCC)-December 2020, treated on suppressive therapy Continue 500 mg Cipro daily for now.  Consider switching to Septra DS daily in the future. Reduce question eliminate PPI which can increase risk of SBP.  He is not sure if he is still on it twice daily I am not sure why he would be on it twice daily based upon my knowledge of the situation.  We will clarify further at at/after EGD.  Type 2 diabetes mellitus with complication, with long-term current use of insulin [facility]) Dietitian referral for diet education he is uncontrolled or was in the hospital.  Concerned about belly fat appropriately so, loss of abdominal fat is in his best interest.  Overweight with body mass index (BMI) of 28 to 28.9 in adult BMI is significantly lower than it used to be.  Has probably lost some muscle mass with his alcoholism and other illnesses, uncontrolled diabetes will lead to weight loss as well.  Still has abdominal adiposity and reduction of that in his best interest.  Dietitian referral.  Encounter for colonoscopy due to history of adenomatous colonic polyps Surveillance colonoscopy is appropriate.  We will schedule.The risks and benefits as well as alternatives of endoscopic procedure(s) have been discussed and reviewed. All questions answered. The patient agrees to proceed.  He has been vaccinated and has also had Covid so he does not need preprocedural testing.The risks and benefits as well as alternatives of endoscopic procedure(s) have been discussed and  reviewed. All questions answered. The patient agrees to proceed.   Subjective:   Chief Complaint: Follow-up of cirrhosis  HPI Gaspar Kidd is here for f/u Hospitalized with jaundice 01/28/2019, had resumed drinking vodka, diagnosed with SBP.  Imaging raised question of cholecystitis thought probably more likely fluid/ascites and not cholecystitis.  Was not a candidate for intervention anyway due  to comorbidities.  Hepatic encephalopathy was treated with lactulose and rifaximin.  He did experience alcohol withdrawal. MRI MRCP was done during that hospitalization cirrhosis no liver lesions Wt Readings from Last 3 Encounters:  03/17/19 225 lb (102.1 kg)  03/14/19 224 lb (101.6 kg)  03/09/19 220 lb (99.8 kg)   Last visit with me November 2018 follow-up of cirrhosis and duodenal ulcer, had admitted to drinking vodka 1 time a week then and was counseled to stop  Last EGD 01/23/2016 with small duodenal ulcer and portal gastropathy and no varices  Last colonoscopy 10/24/2015 multiple polyps, 3+ adenomas largest more than 10 mm plan to repeat 3 years after that not yet done  Now he reports he is better overall he is concerned about his weight loss and inability to lose abdominal fat.  He wants to know "how bad his cirrhosis is".  "Is it bad?"  Abstinent from alcohol since Christmas day.  No edema.  No significant confusion occasional fatigue.  Xifaxan is $900 1 month.  Review of systems pertinent for knee pain both knees unsteady gait he has not fallen in a "long time".  Lab Results  Component Value Date   INR 1.1 (H) 03/14/2019   INR 1.2 02/04/2019   INR 1.2 02/03/2019    Allergies  Allergen Reactions  . 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  Use with caution  . Calcium Channel Blockers     Likely WPW.  Use with caution.   Current Meds  Medication Sig  . blood glucose meter kit and supplies KIT Use to test blood sugar once daily. DX E11.8  . Blood Glucose Monitoring Suppl (FREESTYLE FREEDOM LITE) w/Device KIT Use to check blood sugars 2 times daily  . ciprofloxacin (CIPRO) 500 MG tablet Take 1 tablet (500 mg total) by mouth daily with breakfast. Continue taking it  . cyclobenzaprine (FLEXERIL) 5 MG tablet Take 1 to 2 tabs at bedtime if needed.  . folic acid (FOLVITE) 1 MG tablet Take 1 tablet (1 mg total) by mouth daily.  . furosemide (LASIX) 20 MG tablet Take 1 tablet  (20 mg total) by mouth daily.  Marland Kitchen gabapentin (NEURONTIN) 300 MG capsule Take 1 capsule (300 mg total) by mouth 2 (two) times daily. (Patient taking differently: Take 300 mg by mouth daily. )  . glucose blood (COOL BLOOD GLUCOSE TEST STRIPS) test strip Use to check blood sugar daily. DX: E11.8  . Insulin Glargine, 2 Unit Dial, (TOUJEO MAX SOLOSTAR) 300 UNIT/ML SOPN Inject 30 Units into the skin daily.  Marland Kitchen lactulose (CHRONULAC) 10 GM/15ML solution Take 15 mLs (10 g total) by mouth 2 (two) times daily.  . Lancets (ACCU-CHEK MULTICLIX) lancets Use to check sugar daily. DX: E11.8  . lisinopril (ZESTRIL) 10 MG tablet Take 1 tablet (10 mg total) by mouth daily.  . metFORMIN (GLUMETZA) 1000 MG (MOD) 24 hr tablet Take 1 tablet (1,000 mg total) by mouth 2 (two) times daily with a meal.  . Multiple Vitamin (MULTIVITAMIN WITH MINERALS) TABS tablet Take 1 tablet by mouth daily.  . pantoprazole (PROTONIX) 40 MG tablet TAKE 1 TABLET(40 MG) BY MOUTH  TWICE DAILY  . potassium chloride (KLOR-CON) 20 MEQ packet Klor-Con 20 mEq oral packet  MIX 1 PACKET IN LIQUID AND TAKE BY MOUTH DAILY  . rosuvastatin (CRESTOR) 5 MG tablet Take 1 tablet (5 mg total) by mouth daily.  Marland Kitchen thiamine 100 MG tablet Take 1 tablet (100 mg total) by mouth daily.  . traMADol (ULTRAM) 50 MG tablet tramadol 50 mg tablet  . [DISCONTINUED] penicillin v potassium (VEETID) 500 MG tablet penicillin V potassium 500 mg tablet  TAKE 2 TABLETS BY MOUTH NOW THEN 1 TABLET BY MOUTH FOUR TIMES DAILY UNTIL GONE  . [DISCONTINUED] rifaximin (XIFAXAN) 550 MG TABS tablet Take 1 tablet (550 mg total) by mouth 2 (two) times daily.   Past Medical History:  Diagnosis Date  . Acrophobia   . Alcoholic cirrhosis ([place]) 5/46/2703  . Anemia   . Arthritis    foot by big toe  . BPH associated with nocturia   . Cataract    removed both eyes  . Chronic cough    PMH of  . COVID-19 12/2018  . Diabetes mellitus without complication ([place])   . Diverticulosis 07-03-2010    Colonoscopy.   . Duodenal ulcer 2017  . Fallen arches    Bilateral  . GERD (gastroesophageal reflux disease)   . Gout   . Granuloma annulare   . Hx of adenomatous colonic polyps multiple  . Hydrocele 2011   Large septated right hydrocele  . Liver cyst   . Liver lesion   . Nonspecific elevation of levels of transaminase or lactic acid dehydrogenase (LDH)   . Obesity   . Peripheral neuropathy   . Plantar fasciitis    PMH of  . Portal hypertension ([place]) 2017  . Prostate cancer ([place])   . Right shoulder pain 11/2017  . Sleep apnea    no cpap, patient denies  . Wears reading eyeglasses   . WPW (Wolff-Parkinson-White syndrome) 02/05/2019   Past Surgical History:  Procedure Laterality Date  . CATARACT EXTRACTION, BILATERAL  12/2011    Dr Gershon Crane  . COLONOSCOPY  2017  . COLONOSCOPY W/ POLYPECTOMY  07/03/2010   2 adenomas, diverticulosis on right. Dr [name]  . CYSTOSCOPY N/A 03/15/2018   Procedure: CYSTOSCOPY FLEXIBLE;  Surgeon: [name], MD;  Location: [facility];  Service: Urology;  Laterality: N/A;  NO SEEDS FOUND IN BLADDER  . ESOPHAGOGASTRODUODENOSCOPY (EGD) WITH PROPOFOL N/A 01/08/2016   Procedure: ESOPHAGOGASTRODUODENOSCOPY (EGD) WITH PROPOFOL;  Surgeon: [name], MD;  Location: [facility];  Service: Endoscopy;  Laterality: N/A;  . FLEXIBLE SIGMOIDOSCOPY  2000  . RADIOACTIVE SEED IMPLANT N/A 03/15/2018   Procedure: RADIOACTIVE SEED IMPLANT/BRACHYTHERAPY IMPLANT;  Surgeon: [name], MD;  Location: [facility];  Service: Urology;  Laterality: N/A;   72 SEEDS IMPLANTED  . SIGMOIDOSCOPY    . SPACE OAR INSTILLATION N/A 03/15/2018   Procedure: SPACE OAR INSTILLATION;  Surgeon: [name], MD;  Location: [facility];  Service: Urology;  Laterality: N/A;  . WISDOM TOOTH EXTRACTION     Social History   Social History Narrative   Worked in Adult nurse estate   2 children   Former smoker no drug use    Alcoholism, abstinent since 01/23/2019 most recently   Fun/Hobby: Play golf, grandchildren, [employer]      Patient is left-handed. He lives with his wife in a one level home. He swims most days.   family history includes Leukemia in his father;  Lung cancer in his mother.   Review of Systems See HPI  Objective:   Physical Exam BP 114/62 (BP Location: Left Arm, Patient Position: Sitting, Cuff Size: Normal)   Pulse (!) 111   Temp 97.6 F (36.4 C)   Ht 6' 2" (1.88 m)   Wt 225 lb (102.1 kg)   SpO2 99%   BMI 28.89 kg/m  Eyes anicteric Lungs cta Cor NL abd protuberant and obese 'bell fat" cannot exclude ascites Prominent superficial vv  Ext no c/ce  Alert and oriented x 3 no asterixis Serial 7 about 80%   Data reviewed extensive review of hospital records, primary care notes recently, labs.  Imaging as well.

## 2019-03-17 NOTE — Assessment & Plan Note (Addendum)
Significantly improved from his serious critical illness at the hospital which I think was compounded by alcohol withdrawal and perhaps a component of hepatitis at the time and obviously his SBP was a big issue.  I reviewed his current situation he has decompensated cirrhosis with a history of encephalopathy question how much and how much medication he needs at this time for that.  Continue to abstain from alcohol EGD to screen for varices Beta-blockade is contraindicated for him due to history of SBP and also he has possible Wolff-Parkinson-White and apparently he should not take beta-blockers and that situation as well.  Since no lesions on his MRI in the liver, repeat cross-sectional imaging December 2021, health status permitting.

## 2019-03-17 NOTE — Patient Instructions (Signed)
You have been scheduled for an endoscopy and colonoscopy. Please follow the written instructions given to you at your visit today. Please pick up your prep supplies at the pharmacy within the next 1-3 days. If you use inhalers (even only as needed), please bring them with you on the day of your procedure.   We have put in a referral for the dietician. They will call you to set up the appointment.   Please stop your xifaxan per Dr [name].   Watch for signs of confusion and excessive sleepiness.   I appreciate the opportunity to care for you. Silvano Rusk, MD, [facility]

## 2019-03-17 NOTE — Assessment & Plan Note (Signed)
Surveillance colonoscopy is appropriate.  We will schedule.The risks and benefits as well as alternatives of endoscopic procedure(s) have been discussed and reviewed. All questions answered. The patient agrees to proceed.

## 2019-03-17 NOTE — Assessment & Plan Note (Signed)
He is alert and oriented x3 and does not have asterixis.  I did not recheck an ammonia level as that is not really a good test to follow clinical exam is better.  I think it is reasonable to discontinue his Xifaxan and see how he does.  He was clearly more ill when hospitalized and the catabolic stress on his body is less now.  I have warned him and instructed him to watch himself and have his wife watch for confusion excessive sleepiness etc. we might need to increase his lactulose.  I gather Doreene Nest is not cost prohibitive for him though he would rather not pay $900 a month.  I think it is medically appropriate to discontinue that and see how he does.

## 2019-03-18 ENCOUNTER — Other Ambulatory Visit: Payer: Self-pay | Admitting: Internal Medicine

## 2019-03-18 DIAGNOSIS — K766 Portal hypertension: Secondary | ICD-10-CM | POA: Diagnosis not present

## 2019-03-18 DIAGNOSIS — K7030 Alcoholic cirrhosis of liver without ascites: Secondary | ICD-10-CM | POA: Diagnosis not present

## 2019-03-18 DIAGNOSIS — K652 Spontaneous bacterial peritonitis: Secondary | ICD-10-CM | POA: Diagnosis not present

## 2019-03-18 DIAGNOSIS — I10 Essential (primary) hypertension: Secondary | ICD-10-CM | POA: Diagnosis not present

## 2019-03-18 DIAGNOSIS — K810 Acute cholecystitis: Secondary | ICD-10-CM | POA: Diagnosis not present

## 2019-03-18 DIAGNOSIS — E1151 Type 2 diabetes mellitus with diabetic peripheral angiopathy without gangrene: Secondary | ICD-10-CM | POA: Diagnosis not present

## 2019-03-18 LAB — VITAMIN B1: Vitamin B1 (Thiamine): 40 nmol/L — ABNORMAL HIGH (ref 8–30)

## 2019-03-21 ENCOUNTER — Other Ambulatory Visit: Payer: Self-pay

## 2019-03-21 ENCOUNTER — Encounter: Payer: Self-pay | Admitting: Internal Medicine

## 2019-03-21 ENCOUNTER — Ambulatory Visit (INDEPENDENT_AMBULATORY_CARE_PROVIDER_SITE_OTHER): Payer: Medicare Other | Admitting: Internal Medicine

## 2019-03-21 VITALS — BP 126/64 | HR 91 | Ht 74.0 in | Wt 223.2 lb

## 2019-03-21 DIAGNOSIS — I456 Pre-excitation syndrome: Secondary | ICD-10-CM | POA: Diagnosis not present

## 2019-03-21 MED ORDER — ATENOLOL 25 MG PO TABS: ORAL_TABLET | ORAL | 3 refills | Status: DC

## 2019-03-21 NOTE — Patient Instructions (Addendum)
Medication Instructions:  Your physician has recommended you make the following change in your medication:   1.  Atenolol 25 mg-  Take one tablet as needed for palpitations/heart racing.  May take up to 2 tablets in 24 hour period.  Labwork: None ordered.  Testing/Procedures: None ordered.  Follow-Up: Your physician wants you to follow-up in: 4 months with Dr. Lovena Le.     July 19, 2019 at 9:45 am  Any Other Special Instructions Will Be Listed Below (If Applicable).  If you need a refill on your cardiac medications before your next appointment, please call your pharmacy.   Atenolol Tablets What is this medicine? ATENOLOL (a TEN oh lole) is a beta blocker. It decreases the amount of work your heart has to do and helps your heart beat regularly. It treats high blood pressure and/or prevent chest pain (also called angina). It is also used after a heart attack to prevent a second one. This medicine may be used for other purposes; ask your health care provider or pharmacist if you have questions. COMMON BRAND NAME(S): Tenormin What should I tell my health care provider before I take this medicine? They need to know if you have any of these conditions:  diabetes  heart or vessel disease like slow heart rate, worsening heart failure, heart block, sick sinus syndrome or Raynaud's disease  kidney disease  lung or breathing disease, like asthma or emphysema  pheochromocytoma  thyroid disease  an unusual or allergic reaction to atenolol, other beta-blockers, medicines, foods, dyes, or preservatives  pregnant or trying to get pregnant  breast-feeding How should I use this medicine? Take this drug by mouth. Take it as directed on the prescription label at the same time every day. You can take it with or without food. If it upsets your stomach, take it with food. Keep taking it unless your health care provider tells you to stop. Talk to your health care provider about the use of this  drug in children. Special care may be needed. Overdosage: If you think you have taken too much of this medicine contact a poison control center or emergency room at once. NOTE: This medicine is only for you. Do not share this medicine with others. What if I miss a dose? If you miss a dose, take it as soon as you can. If it is almost time for your next dose, take only that dose. Do not take double or extra doses. What may interact with this medicine? This medicine may interact with the following medications:  certain medicines for blood pressure, heart disease, irregular heart beat  clonidine  digoxin  diuretics  dobutamine  epinephrine  isoproterenol  NSAIDs, medicines for pain and inflammation, like ibuprofen or naproxen  reserpine This list may not describe all possible interactions. Give your health care provider a list of all the medicines, herbs, non-prescription drugs, or dietary supplements you use. Also tell them if you smoke, drink alcohol, or use illegal drugs. Some items may interact with your medicine. What should I watch for while using this medicine? Visit your doctor or health care professional for regular check ups. Check your blood pressure and pulse rate regularly. Ask your health care professional what your blood pressure and pulse rate should be, and when you should contact him or her. You may get drowsy or dizzy. Do not drive, use machinery, or do anything that needs mental alertness until you know how this medicine affects you. Do not stand or sit up quickly. Alcohol  may interfere with the effect of this medicine. Avoid alcoholic drinks. This medicine may increase blood sugar. Ask your healthcare provider if changes in diet or medicines are needed if you have diabetes. Do not treat yourself for coughs, colds, or pain while you are taking this medicine without asking your doctor or health care professional for advice. Some ingredients may increase your blood  pressure. What side effects may I notice from receiving this medicine? Side effects that you should report to your doctor or health care professional as soon as possible:  allergic reactions like skin rash, itching or hives, swelling of the face, lips, or tongue  breathing problems  changes in vision  chest pain  cold, tingling, or numb hands or feet  depression  fast, irregular heartbeat  feeling faint or lightheaded, falls  fever with sore throat  rapid weight gain   signs and symptoms of high blood sugar such as being more thirsty or hungry or having to urinate more than normal. You may also feel very tired or have blurry vision.  swollen ankles, legs Side effects that usually do not require medical attention (report to your doctor or health care professional if they continue or are bothersome):  anxiety, nervous  diarrhea  dry skin  change in sex drive or performance  headache  nightmares or trouble sleeping  short term memory loss  stomach upset  unusually tired This list may not describe all possible side effects. Call your doctor for medical advice about side effects. You may report side effects to FDA at 1-800-FDA-1088. Where should I keep my medicine? Keep out of the reach of children and pets. Store at room temperature between 20 and 25 degrees C (68 and 77 degrees F). Throw away any unused drug after the expiration date. NOTE: This sheet is a summary. It may not cover all possible information. If you have questions about this medicine, talk to your doctor, pharmacist, or health care provider.  2020 Elsevier/Gold Standard (2018-09-02 15:07:07)

## 2019-03-21 NOTE — Progress Notes (Signed)
HPI [name] is referred today by Dr. Oval Linsey for evaluation of atrial fib and WPW syndrome. He is a pleasant 74 yo with ETOH cirrhosis and ascites,who presented with AMS and encephalopathy several weeks ago. He had atrial fib but no pre-excitation. He reverted back to NSR. He has not been thought to be a candidate for systemic anti-coagulation due to cirrhosis. He has not fallen and does not have any active bleeding. He is not pre-excited. Allergies  Allergen Reactions  . 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  Use with caution  . Calcium Channel Blockers     Likely WPW.  Use with caution.     Current Outpatient Medications  Medication Sig Dispense Refill  . gabapentin (NEURONTIN) 300 MG capsule Take 1 capsule (300 mg total) by mouth 2 (two) times daily. 180 capsule 3  . blood glucose meter kit and supplies KIT Use to test blood sugar once daily. DX E11.8 1 each 0  . Blood Glucose Monitoring Suppl (FREESTYLE FREEDOM LITE) w/Device KIT Use to check blood sugars 2 times daily 1 each 1  . ciprofloxacin (CIPRO) 500 MG tablet Take 1 tablet (500 mg total) by mouth daily with breakfast. Continue taking it 60 tablet 0  . COVID-19 mRNA vaccine, Moderna, 100 MCG/0.5ML SUSP Moderna COVID-19 Vaccine (PF) 100 mcg/0.5 mL intramuscular susp. (EUA)  PHARMACY ADMINISTERED    . cyclobenzaprine (FLEXERIL) 5 MG tablet Take 1 to 2 tabs at bedtime if needed. 30 tablet 1  . folic acid (FOLVITE) 1 MG tablet Take 1 tablet (1 mg total) by mouth daily.  3  . furosemide (LASIX) 20 MG tablet Take 1 tablet (20 mg total) by mouth daily. 30 tablet   . glucose blood (COOL BLOOD GLUCOSE TEST STRIPS) test strip Use to check blood sugar daily. DX: E11.8 100 each 12  . Insulin Glargine, 2 Unit Dial, (TOUJEO MAX SOLOSTAR) 300 UNIT/ML SOPN Inject 30 Units into the skin daily. 3 mL 3  . lactulose (CHRONULAC) 10 GM/15ML solution Take 15 mLs (10 g total) by mouth 2 (two) times daily. 236 mL 0  . Lancets (ACCU-CHEK  MULTICLIX) lancets Use to check sugar daily. DX: E11.8 100 each 12  . lisinopril (ZESTRIL) 10 MG tablet Take 1 tablet (10 mg total) by mouth daily.    . metFORMIN (GLUMETZA) 1000 MG (MOD) 24 hr tablet Take 1 tablet (1,000 mg total) by mouth 2 (two) times daily with a meal. 180 tablet 1  . Multiple Vitamin (MULTIVITAMIN WITH MINERALS) TABS tablet Take 1 tablet by mouth daily.    . pantoprazole (PROTONIX) 40 MG tablet TAKE 1 TABLET(40 MG) BY MOUTH TWICE DAILY 180 tablet 0  . potassium chloride (KLOR-CON) 20 MEQ packet Klor-Con 20 mEq oral packet  MIX 1 PACKET IN LIQUID AND TAKE BY MOUTH DAILY    . rosuvastatin (CRESTOR) 5 MG tablet Take 1 tablet (5 mg total) by mouth daily. 90 tablet 1  . thiamine 100 MG tablet Take 1 tablet (100 mg total) by mouth daily.     No current facility-administered medications for this visit.     Past Medical History:  Diagnosis Date  . Acrophobia   . Alcoholic cirrhosis ([place]) 0/18/0970  . Anemia   . Arthritis    foot by big toe  . BPH associated with nocturia   . Cataract    removed both eyes  . Chronic cough    PMH of  . COVID-19 12/2018  . Diabetes  mellitus without complication ([place])   . Diverticulosis 07-03-2010   Colonoscopy.   . Duodenal ulcer 2017  . Fallen arches    Bilateral  . GERD (gastroesophageal reflux disease)   . Gout   . Granuloma annulare   . Hx of adenomatous colonic polyps multiple  . Hydrocele 2011   Large septated right hydrocele  . Liver cyst   . Liver lesion   . Nonspecific elevation of levels of transaminase or lactic acid dehydrogenase (LDH)   . Obesity   . Peripheral neuropathy   . Plantar fasciitis    PMH of  . Portal hypertension ([place]) 2017  . Prostate cancer ([place])   . Right shoulder pain 11/2017  . Sleep apnea    no cpap, patient denies  . Wears reading eyeglasses   . WPW (Wolff-Parkinson-White syndrome) 02/05/2019    ROS:   All systems reviewed and negative except as noted in the HPI.   Past Surgical  History:  Procedure Laterality Date  . CATARACT EXTRACTION, BILATERAL  12/2011    Dr Gershon Crane  . COLONOSCOPY  2017  . COLONOSCOPY W/ POLYPECTOMY  07/03/2010   2 adenomas, diverticulosis on right. Dr [name]  . CYSTOSCOPY N/A 03/15/2018   Procedure: CYSTOSCOPY FLEXIBLE;  Surgeon: [name], MD;  Location: [facility];  Service: Urology;  Laterality: N/A;  NO SEEDS FOUND IN BLADDER  . ESOPHAGOGASTRODUODENOSCOPY (EGD) WITH PROPOFOL N/A 01/08/2016   Procedure: ESOPHAGOGASTRODUODENOSCOPY (EGD) WITH PROPOFOL;  Surgeon: [name], MD;  Location: [facility];  Service: Endoscopy;  Laterality: N/A;  . FLEXIBLE SIGMOIDOSCOPY  2000  . RADIOACTIVE SEED IMPLANT N/A 03/15/2018   Procedure: RADIOACTIVE SEED IMPLANT/BRACHYTHERAPY IMPLANT;  Surgeon: [name], MD;  Location[facility];  Service: Urology;  Laterality: N/A;   72 SEEDS IMPLANTED  . SIGMOIDOSCOPY    . SPACE OAR INSTILLATION N/A 03/15/2018   Procedure: SPACE OAR INSTILLATION;  Surgeon: [name], MD;  Location: [facility];  Service: Urology;  Laterality: N/A;  . WISDOM TOOTH EXTRACTION       Family History  Problem Relation Age of Onset  . Lung cancer Mother        smoker  . Leukemia Father        Acute myelocytic  . Diabetes Neg Hx   . Stroke Neg Hx   . Heart disease Neg Hx   . Colon cancer Neg Hx   . Colon polyps Neg Hx   . Esophageal cancer Neg Hx   . Rectal cancer Neg Hx   . Stomach cancer Neg Hx      Social History   Socioeconomic History  . Marital status: Married    Spouse name: [name]  . Number of children: 2  . Years of education: Not on file  . Highest education level: Some college, no degree  Occupational History  . Occupation: Adult nurse estate  Tobacco Use  . Smoking status: Former Smoker    Packs/day: 2.00    Years: 6.00    Pack years: 12.00    Types: Cigarettes    Quit date: 02/04/1971    Years since quitting: 48.1  .  Smokeless tobacco: Never Used  . Tobacco comment: smoked age [age], up to 2 ppd  Substance and Sexual Activity  . Alcohol use: Not Currently    Alcohol/week: 28.0 standard drinks    Types: 28 Shots of liquor per week    Comment: 2-3 vodka martinins a day  .  Drug use: No  . Sexual activity: Yes    Partners: Female  Other Topics Concern  . Not on file  Social History Narrative   Worked in Adult nurse estate   2 children   Former smoker no drug use   Alcoholism, abstinent since 01/23/2019 most recently   Fun/Hobby: Play golf, grandchildren, [employer]      Patient is left-handed. He lives with his wife in a one level home. He swims most days.   Social Determinants of Health   Financial Resource Strain:   . Difficulty of Paying Living Expenses: Not on file  Food Insecurity:   . Worried About Charity fundraiser in the Last Year: Not on file  . Ran Out of Food in the Last Year: Not on file  Transportation Needs:   . Lack of Transportation (Medical): Not on file  . Lack of Transportation (Non-Medical): Not on file  Physical Activity:   . Days of Exercise per Week: Not on file  . Minutes of Exercise per Session: Not on file  Stress:   . Feeling of Stress : Not on file  Social Connections:   . Frequency of Communication with Friends and Family: Not on file  . Frequency of Social Gatherings with Friends and Family: Not on file  . Attends Religious Services: Not on file  . Active Member of Clubs or Organizations: Not on file  . Attends Archivist Meetings: Not on file  . Marital Status: Not on file  Intimate Partner Violence:   . Fear of Current or Ex-Partner: Not on file  . Emotionally Abused: Not on file  . Physically Abused: Not on file  . Sexually Abused: Not on file     BP 126/64   Pulse 91   Ht 6' 2" (1.88 m)   Wt 223 lb 3.2 oz (101.2 kg)   SpO2 91%   BMI 28.66 kg/m   Physical Exam:  stable appearing 74 yo man, NAD HEENT: Unremarkable Neck:  6 cm  JVD, no thyromegally Lymphatics:  No adenopathy Back:  No CVA tenderness Lungs:  Clear with no wheezes HEART:  Regular rate rhythm, no murmurs, no rubs, no clicks Abd:  soft, positive bowel sounds, no organomegally, no rebound, no guarding, distended with ascites Ext:  2 plus pulses, no edema, no cyanosis, no clubbing Skin:  No rashes no nodules Neuro:  CN II through XII intact, motor grossly intact  EKG - reviewed. NSR with no pre-excitation  Assess/Plan: 1. WPW - he is not pre-excited. He may take a beta blocker. 2. PAF - I have prescribed low dose atenolol to take if he has recurrent symptoms. He is not a candidate for systemic anti-coagulation due to his liver disease.  3. ETOH abuse - He asked me about drinking on "special occasions" and I strongly encouraged him not to ever drink again.  4.

## 2019-03-22 ENCOUNTER — Telehealth: Payer: Self-pay

## 2019-03-22 DIAGNOSIS — K7030 Alcoholic cirrhosis of liver without ascites: Secondary | ICD-10-CM | POA: Diagnosis not present

## 2019-03-22 DIAGNOSIS — E1151 Type 2 diabetes mellitus with diabetic peripheral angiopathy without gangrene: Secondary | ICD-10-CM | POA: Diagnosis not present

## 2019-03-22 DIAGNOSIS — I10 Essential (primary) hypertension: Secondary | ICD-10-CM | POA: Diagnosis not present

## 2019-03-22 DIAGNOSIS — K652 Spontaneous bacterial peritonitis: Secondary | ICD-10-CM | POA: Diagnosis not present

## 2019-03-22 DIAGNOSIS — K810 Acute cholecystitis: Secondary | ICD-10-CM | POA: Diagnosis not present

## 2019-03-22 DIAGNOSIS — K766 Portal hypertension: Secondary | ICD-10-CM | POA: Diagnosis not present

## 2019-03-22 MED ORDER — METFORMIN HCL 500 MG PO TABS: 500.0000 mg | ORAL_TABLET | Freq: Two times a day (BID) | ORAL | 1 refills | Status: DC

## 2019-03-22 NOTE — Telephone Encounter (Signed)
Contacted pharmacy, Metformin 24 hour cost 18K.   Per PCP, okay to change to the metformin 500 mg bid.   Erx has been sent.

## 2019-03-22 NOTE — Telephone Encounter (Signed)
New message    Medication Requested: metFORMIN (GLUMETZA) 1000 MG (MOD) 24 hr tablet  Is medication on med list:  (if no, inform pt they may need an appointment)  Is medication a controled: (yes = last OV with PCP)  -Controlled Substances: Adderall, Ritalin, oxycodone, hydrocodone, methadone, alprazolam, etc  Last visit with PCP:  Last seen 03/14/19   Is the OV > than 4 months: (yes = schedule an appt if one is not already made)  Pharmacy (Name, [place]): [facility] on [address]

## 2019-03-23 ENCOUNTER — Other Ambulatory Visit: Payer: Self-pay | Admitting: *Deleted

## 2019-03-23 MED ORDER — FUROSEMIDE 20 MG PO TABS: 20.0000 mg | ORAL_TABLET | Freq: Every day | ORAL | 1 refills | Status: DC

## 2019-03-23 MED ORDER — FOLIC ACID 1 MG PO TABS: 1.0000 mg | ORAL_TABLET | Freq: Every day | ORAL | 1 refills | Status: DC

## 2019-03-23 MED ORDER — CIPROFLOXACIN HCL 500 MG PO TABS: 500.0000 mg | ORAL_TABLET | Freq: Every day | ORAL | 2 refills | Status: AC

## 2019-03-23 MED ORDER — PANTOPRAZOLE SODIUM 40 MG PO TBEC: 40.0000 mg | DELAYED_RELEASE_TABLET | Freq: Two times a day (BID) | ORAL | 1 refills | Status: DC

## 2019-03-23 MED ORDER — GABAPENTIN 300 MG PO CAPS: 300.0000 mg | ORAL_CAPSULE | Freq: Two times a day (BID) | ORAL | 1 refills | Status: DC

## 2019-03-23 NOTE — Addendum Note (Signed)
Addended by: Aviva Signs M on: 03/23/2019 12:26 PM   Modules accepted: Orders

## 2019-03-24 DIAGNOSIS — K766 Portal hypertension: Secondary | ICD-10-CM | POA: Diagnosis not present

## 2019-03-24 DIAGNOSIS — K810 Acute cholecystitis: Secondary | ICD-10-CM | POA: Diagnosis not present

## 2019-03-24 DIAGNOSIS — K652 Spontaneous bacterial peritonitis: Secondary | ICD-10-CM | POA: Diagnosis not present

## 2019-03-24 DIAGNOSIS — K7030 Alcoholic cirrhosis of liver without ascites: Secondary | ICD-10-CM | POA: Diagnosis not present

## 2019-03-24 DIAGNOSIS — E1151 Type 2 diabetes mellitus with diabetic peripheral angiopathy without gangrene: Secondary | ICD-10-CM | POA: Diagnosis not present

## 2019-03-24 DIAGNOSIS — I10 Essential (primary) hypertension: Secondary | ICD-10-CM | POA: Diagnosis not present

## 2019-03-24 NOTE — Telephone Encounter (Signed)
Called [facility]. Rep stated that the request would need to be completed via Covermymeds.   KEY: [id number]

## 2019-03-25 DIAGNOSIS — I10 Essential (primary) hypertension: Secondary | ICD-10-CM | POA: Diagnosis not present

## 2019-03-25 DIAGNOSIS — K652 Spontaneous bacterial peritonitis: Secondary | ICD-10-CM | POA: Diagnosis not present

## 2019-03-25 DIAGNOSIS — K810 Acute cholecystitis: Secondary | ICD-10-CM | POA: Diagnosis not present

## 2019-03-25 DIAGNOSIS — K766 Portal hypertension: Secondary | ICD-10-CM | POA: Diagnosis not present

## 2019-03-25 DIAGNOSIS — K7030 Alcoholic cirrhosis of liver without ascites: Secondary | ICD-10-CM | POA: Diagnosis not present

## 2019-03-25 DIAGNOSIS — E1151 Type 2 diabetes mellitus with diabetic peripheral angiopathy without gangrene: Secondary | ICD-10-CM | POA: Diagnosis not present

## 2019-03-28 MED ORDER — PANTOPRAZOLE SODIUM 40 MG PO TBEC: 40.0000 mg | DELAYED_RELEASE_TABLET | Freq: Every day | ORAL | 1 refills | Status: DC

## 2019-03-28 MED ORDER — LISINOPRIL 10 MG PO TABS: 10.0000 mg | ORAL_TABLET | Freq: Every day | ORAL | 1 refills | Status: DC

## 2019-03-28 NOTE — Addendum Note (Signed)
Addended by: Karle Barr on: 03/28/2019 04:53 PM   Modules accepted: Orders

## 2019-03-29 ENCOUNTER — Telehealth: Payer: Self-pay

## 2019-03-29 ENCOUNTER — Encounter: Payer: Self-pay | Admitting: Internal Medicine

## 2019-03-29 DIAGNOSIS — I10 Essential (primary) hypertension: Secondary | ICD-10-CM | POA: Diagnosis not present

## 2019-03-29 DIAGNOSIS — Z9181 History of falling: Secondary | ICD-10-CM | POA: Diagnosis not present

## 2019-03-29 DIAGNOSIS — K810 Acute cholecystitis: Secondary | ICD-10-CM | POA: Diagnosis not present

## 2019-03-29 DIAGNOSIS — K219 Gastro-esophageal reflux disease without esophagitis: Secondary | ICD-10-CM | POA: Diagnosis not present

## 2019-03-29 DIAGNOSIS — E1142 Type 2 diabetes mellitus with diabetic polyneuropathy: Secondary | ICD-10-CM | POA: Diagnosis not present

## 2019-03-29 DIAGNOSIS — I456 Pre-excitation syndrome: Secondary | ICD-10-CM | POA: Diagnosis not present

## 2019-03-29 DIAGNOSIS — K652 Spontaneous bacterial peritonitis: Secondary | ICD-10-CM | POA: Diagnosis not present

## 2019-03-29 DIAGNOSIS — E1151 Type 2 diabetes mellitus with diabetic peripheral angiopathy without gangrene: Secondary | ICD-10-CM | POA: Diagnosis not present

## 2019-03-29 DIAGNOSIS — K766 Portal hypertension: Secondary | ICD-10-CM | POA: Diagnosis not present

## 2019-03-29 DIAGNOSIS — M5412 Radiculopathy, cervical region: Secondary | ICD-10-CM | POA: Diagnosis not present

## 2019-03-29 DIAGNOSIS — Z8616 Personal history of COVID-19: Secondary | ICD-10-CM | POA: Diagnosis not present

## 2019-03-29 DIAGNOSIS — K7030 Alcoholic cirrhosis of liver without ascites: Secondary | ICD-10-CM | POA: Diagnosis not present

## 2019-03-29 DIAGNOSIS — D72829 Elevated white blood cell count, unspecified: Secondary | ICD-10-CM | POA: Diagnosis not present

## 2019-03-29 NOTE — Telephone Encounter (Signed)
New message    Checking on the status of medication form fax over 03/18/19.   medication assistance for Xisaxan    Please advise

## 2019-03-29 NOTE — Telephone Encounter (Signed)
Called [facility] but office was closed. Will call in the morning.

## 2019-03-30 MED ORDER — RIFAXIMIN 550 MG PO TABS: 550.0000 mg | ORAL_TABLET | Freq: Two times a day (BID) | ORAL | 3 refills | Status: DC

## 2019-03-30 NOTE — Telephone Encounter (Signed)
Form completed and signed by PharmD. Copy made.   Form faxed and mailed (with original rx).

## 2019-03-30 NOTE — Telephone Encounter (Signed)
Form from [place] has been located. And will be completed. Per rep at [facility], it is okay for an approved representative to sign in PCP absence, not just a prescriber.

## 2019-03-31 ENCOUNTER — Other Ambulatory Visit: Payer: Self-pay

## 2019-03-31 ENCOUNTER — Ambulatory Visit (AMBULATORY_SURGERY_CENTER): Payer: Medicare Other | Admitting: Internal Medicine

## 2019-03-31 ENCOUNTER — Encounter: Payer: Self-pay | Admitting: Internal Medicine

## 2019-03-31 VITALS — BP 100/65 | HR 97 | Temp 96.9°F | Resp 20 | Ht 74.0 in | Wt 223.0 lb

## 2019-03-31 DIAGNOSIS — G4733 Obstructive sleep apnea (adult) (pediatric): Secondary | ICD-10-CM | POA: Diagnosis not present

## 2019-03-31 DIAGNOSIS — E119 Type 2 diabetes mellitus without complications: Secondary | ICD-10-CM | POA: Diagnosis not present

## 2019-03-31 DIAGNOSIS — D124 Benign neoplasm of descending colon: Secondary | ICD-10-CM | POA: Diagnosis not present

## 2019-03-31 DIAGNOSIS — Z1211 Encounter for screening for malignant neoplasm of colon: Secondary | ICD-10-CM

## 2019-03-31 DIAGNOSIS — Z86010 Personal history of colonic polyps: Secondary | ICD-10-CM

## 2019-03-31 DIAGNOSIS — K7031 Alcoholic cirrhosis of liver with ascites: Secondary | ICD-10-CM | POA: Diagnosis not present

## 2019-03-31 DIAGNOSIS — I10 Essential (primary) hypertension: Secondary | ICD-10-CM | POA: Diagnosis not present

## 2019-03-31 DIAGNOSIS — K7460 Unspecified cirrhosis of liver: Secondary | ICD-10-CM | POA: Diagnosis not present

## 2019-03-31 MED ORDER — SODIUM CHLORIDE 0.9 % IV SOLN: 500.0000 mL | Freq: Once | INTRAVENOUS | Status: DC

## 2019-03-31 NOTE — Progress Notes (Signed)
Called to room to assist during endoscopic procedure.  Patient ID and intended procedure confirmed with present staff. Received instructions for my participation in the procedure from the performing physician.  

## 2019-03-31 NOTE — Progress Notes (Signed)
JB- temp CW- vitals 

## 2019-03-31 NOTE — Op Note (Signed)
[place] Patient Name: Patrick Kidd Procedure Date: 03/31/2019 2:38 PM MRN: [id number] Endoscopist: Gatha Mayer , MD Age: 74 Referring MD:  Date of Birth: [birth year] Gender: Male Account #: [id number] Procedure:                Colonoscopy Indications:              Surveillance: Personal history of adenomatous                            polyps on last colonoscopy > 3 years ago Medicines:                Propofol per Anesthesia, Monitored Anesthesia Care Procedure:                Pre-Anesthesia Assessment:                           - Prior to the procedure, a History and Physical                            was performed, and patient medications and                            allergies were reviewed. The patient's tolerance of                            previous anesthesia was also reviewed. The risks                            and benefits of the procedure and the sedation                            options and risks were discussed with the patient.                            All questions were answered, and informed consent                            was obtained. Prior Anticoagulants: The patient has                            taken no previous anticoagulant or antiplatelet                            agents. ASA Grade Assessment: III - A patient with                            severe systemic disease. After reviewing the risks                            and benefits, the patient was deemed in                            satisfactory condition to undergo the procedure.  After obtaining informed consent, the colonoscope                            was passed under direct vision. Throughout the                            procedure, the patient's blood pressure, pulse, and                            oxygen saturations were monitored continuously. The                            Colonoscope was introduced through the anus and      advanced to the the cecum, identified by                            appendiceal orifice and ileocecal valve. The                            colonoscopy was performed without difficulty. The                            patient tolerated the procedure well. The quality                            of the bowel preparation was excellent. The                            ileocecal valve, appendiceal orifice, and rectum                            were photographed. Scope In: 2:50:12 PM Scope Out: 3:03:27 PM Scope Withdrawal Time: 0 hours 10 minutes 50 seconds  Total Procedure Duration: 0 hours 13 minutes 15 seconds  Findings:                 The perianal and digital rectal examinations were                            normal. Pertinent negatives include normal prostate                            (size, shape, and consistency).                           Two sessile polyps were found in the descending                            colon. The polyps were diminutive in size. These                            polyps were removed with a cold snare. Resection                            and retrieval were complete.  Verification of                            patient identification for the specimen was done.                            Estimated blood loss was minimal.                           Diverticula were found in the left colon.                           Small rectal varices were found.                           The exam was otherwise without abnormality on                            direct and retroflexion views. Complications:            No immediate complications. Estimated Blood Loss:     Estimated blood loss was minimal. Impression:               - Two diminutive polyps in the descending colon,                            removed with a cold snare. Resected and retrieved.                           - Diverticulosis in the left colon.                           - Rectal varices.                            - The examination was otherwise normal on direct                            and retroflexion views.                           - Personal history of colonic polyps. Adenomas                            2012, 2017 Recommendation:           - Patient has a contact number available for                            emergencies. The signs and symptoms of potential                            delayed complications were discussed with the                            patient. Return to normal activities tomorrow.  Written discharge instructions were provided to the                            patient.                           - Resume previous diet.                           - No repeat colonoscopy due to age. Gatha Mayer, MD 03/31/2019 3:14:09 PM This report has been signed electronically.

## 2019-03-31 NOTE — Op Note (Signed)
[place] Patient Name: Patrick Kidd Procedure Date: 03/31/2019 2:38 PM MRN: [id number] Endoscopist: Gatha Mayer , MD Age: 74 Referring MD:  Date of Birth: 1945-11-24 Gender: Male Account #: [id number] Procedure:                Upper GI endoscopy Indications:              Cirrhosis rule out esophageal varices Medicines:                Propofol per Anesthesia, Monitored Anesthesia Care Procedure:                Pre-Anesthesia Assessment:                           - Prior to the procedure, a History and Physical                            was performed, and patient medications and                            allergies were reviewed. The patient's tolerance of                            previous anesthesia was also reviewed. The risks                            and benefits of the procedure and the sedation                            options and risks were discussed with the patient.                            All questions were answered, and informed consent                            was obtained. Prior Anticoagulants: The patient has                            taken no previous anticoagulant or antiplatelet                            agents. ASA Grade Assessment: III - A patient with                            severe systemic disease. After reviewing the risks                            and benefits, the patient was deemed in                            satisfactory condition to undergo the procedure.                           After obtaining informed consent, the endoscope was  passed under direct vision. Throughout the                            procedure, the patient's blood pressure, pulse, and                            oxygen saturations were monitored continuously. The                            Endoscope was introduced through the mouth, and                            advanced to the second part of duodenum. The upper             GI endoscopy was accomplished without difficulty.                            The patient tolerated the procedure well. Scope In: Scope Out: Findings:                 The esophagus was normal.                           The stomach was normal.                           The examined duodenum was normal.                           The cardia and gastric fundus were normal on                            retroflexion. Complications:            No immediate complications. Estimated Blood Loss:     Estimated blood loss: none. Impression:               - Normal esophagus.                           - Normal stomach.                           - Normal examined duodenum.                           - No specimens collected. Recommendation:           - Patient has a contact number available for                            emergencies. The signs and symptoms of potential                            delayed complications were discussed with the                            patient. Return to normal activities tomorrow.  Written discharge instructions were provided to the                            patient.                           - Resume previous diet.                           - Continue present medications.                           - See the other procedure note for documentation of                            additional recommendations.                           - Repeat upper endoscopy in 1 year for screening                            purposes. Gatha Mayer, MD 03/31/2019 3:10:23 PM This report has been signed electronically.

## 2019-03-31 NOTE — Progress Notes (Signed)
Report given to PACU, vss 

## 2019-03-31 NOTE — Telephone Encounter (Signed)
Fax unsuccessful.   Faxing again to [phone]

## 2019-03-31 NOTE — Patient Instructions (Addendum)
The upper endoscopy was normal - excellent news. No varices (enlarged veins) seen. Will recheck in about 1 year most likely.  Two tiny colon polyps removed. I believe benign and not an issue.  Rare diverticulosis and enlarged hemorrhods/rectal veins.   Nothing bad here either!  I recommend that you see me in the office in about 6 months, so please mark your calendar for May or June to call and get a July or August 2021 appointment.  I appreciate the opportunity to care for you. Gatha Mayer, MD, [facility]  Handout on polyps and diverticulosis given.   YOU HAD AN ENDOSCOPIC PROCEDURE TODAY AT [facility]:   Refer to the procedure report that was given to you for any specific questions about what was found during the examination.  If the procedure report does not answer your questions, please call your gastroenterologist to clarify.  If you requested that your care partner not be given the details of your procedure findings, then the procedure report has been included in a sealed envelope for you to review at your convenience later.  YOU SHOULD EXPECT: Some feelings of bloating in the abdomen. Passage of more gas than usual.  Walking can help get rid of the air that was put into your GI tract during the procedure and reduce the bloating. If you had a lower endoscopy (such as a colonoscopy or flexible sigmoidoscopy) you may notice spotting of blood in your stool or on the toilet paper. If you underwent a bowel prep for your procedure, you may not have a normal bowel movement for a few days.  Please Note:  You might notice some irritation and congestion in your nose or some drainage.  This is from the oxygen used during your procedure.  There is no need for concern and it should clear up in a day or so.  SYMPTOMS TO REPORT IMMEDIATELY:   Following lower endoscopy (colonoscopy or flexible sigmoidoscopy):  Excessive amounts of blood in the stool  Significant tenderness or  worsening of abdominal pains  Swelling of the abdomen that is new, acute  Fever of 100F or higher   Following upper endoscopy (EGD)  Vomiting of blood or coffee ground material  New chest pain or pain under the shoulder blades  Painful or persistently difficult swallowing  New shortness of breath  Fever of 100F or higher  Black, tarry-looking stools  For urgent or emergent issues, a gastroenterologist can be reached at any hour by calling [phone].   DIET:  We do recommend a small meal at first, but then you may proceed to your regular diet.  Drink plenty of fluids but you should avoid alcoholic beverages for 24 hours.  ACTIVITY:  You should plan to take it easy for the rest of today and you should NOT DRIVE or use heavy machinery until tomorrow (because of the sedation medicines used during the test).    FOLLOW UP: Our staff will call the number listed on your records 48-72 hours following your procedure to check on you and address any questions or concerns that you may have regarding the information given to you following your procedure. If we do not reach you, we will leave a message.  We will attempt to reach you two times.  During this call, we will ask if you have developed any symptoms of COVID 19. If you develop any symptoms (ie: fever, flu-like symptoms, shortness of breath, cough etc.) before then, please call [phone].  If  you test positive for Covid 19 in the 2 weeks post procedure, please call and report this information to [place].    If any biopsies were taken you will be contacted by phone or by letter within the next 1-3 weeks.  Please call us at [phone] if you have not heard about the biopsies in 3 weeks.    SIGNATURES/CONFIDENTIALITY: You and/or your care partner have signed paperwork which will be entered into your electronic medical record.  These signatures attest to the fact that that the information above on your After Visit Summary has been reviewed  and is understood.  Full responsibility of the confidentiality of this discharge information lies with you and/or your care-partner.

## 2019-04-01 DIAGNOSIS — K766 Portal hypertension: Secondary | ICD-10-CM | POA: Diagnosis not present

## 2019-04-01 DIAGNOSIS — E1151 Type 2 diabetes mellitus with diabetic peripheral angiopathy without gangrene: Secondary | ICD-10-CM | POA: Diagnosis not present

## 2019-04-01 DIAGNOSIS — K652 Spontaneous bacterial peritonitis: Secondary | ICD-10-CM | POA: Diagnosis not present

## 2019-04-01 DIAGNOSIS — K7030 Alcoholic cirrhosis of liver without ascites: Secondary | ICD-10-CM | POA: Diagnosis not present

## 2019-04-01 DIAGNOSIS — I10 Essential (primary) hypertension: Secondary | ICD-10-CM | POA: Diagnosis not present

## 2019-04-01 DIAGNOSIS — K810 Acute cholecystitis: Secondary | ICD-10-CM | POA: Diagnosis not present

## 2019-04-04 ENCOUNTER — Encounter: Payer: Self-pay | Admitting: Internal Medicine

## 2019-04-04 ENCOUNTER — Telehealth: Payer: Self-pay

## 2019-04-04 ENCOUNTER — Telehealth: Payer: Self-pay | Admitting: *Deleted

## 2019-04-04 DIAGNOSIS — K766 Portal hypertension: Secondary | ICD-10-CM | POA: Diagnosis not present

## 2019-04-04 DIAGNOSIS — K652 Spontaneous bacterial peritonitis: Secondary | ICD-10-CM | POA: Diagnosis not present

## 2019-04-04 DIAGNOSIS — I10 Essential (primary) hypertension: Secondary | ICD-10-CM | POA: Diagnosis not present

## 2019-04-04 DIAGNOSIS — E1151 Type 2 diabetes mellitus with diabetic peripheral angiopathy without gangrene: Secondary | ICD-10-CM | POA: Diagnosis not present

## 2019-04-04 DIAGNOSIS — K810 Acute cholecystitis: Secondary | ICD-10-CM | POA: Diagnosis not present

## 2019-04-04 DIAGNOSIS — K7030 Alcoholic cirrhosis of liver without ascites: Secondary | ICD-10-CM | POA: Diagnosis not present

## 2019-04-04 NOTE — Telephone Encounter (Signed)
  Follow up Call-  Call back number 03/31/2019  Post procedure Call Back phone  # [phone]  Permission to leave phone message Yes  Some recent data might be hidden     Patient questions:  Do you have a fever, pain , or abdominal swelling? No. Pain Score  0 *  Have you tolerated food without any problems? Yes.    Have you been able to return to your normal activities? Yes.    Do you have any questions about your discharge instructions: Diet   No. Medications  No. Follow up visit  No.  Do you have questions or concerns about your Care? No.  Actions: * If pain score is 4 or above: 1. No action needed, pain <4.Have you developed a fever since your procedure? no  2.   Have you had an respiratory symptoms (SOB or cough) since your procedure? no  3.   Have you tested positive for COVID 19 since your procedure no  4.   Have you had any family members/close contacts diagnosed with the COVID 19 since your procedure?  no   If yes to any of these questions please route to Joylene John, RN and Alphonsa Gin, Therapist, sports.

## 2019-04-04 NOTE — Telephone Encounter (Signed)
1st follow up call made.  NALM 

## 2019-04-05 ENCOUNTER — Encounter: Payer: Self-pay | Admitting: Internal Medicine

## 2019-04-05 DIAGNOSIS — I10 Essential (primary) hypertension: Secondary | ICD-10-CM | POA: Diagnosis not present

## 2019-04-05 DIAGNOSIS — K652 Spontaneous bacterial peritonitis: Secondary | ICD-10-CM | POA: Diagnosis not present

## 2019-04-05 DIAGNOSIS — K766 Portal hypertension: Secondary | ICD-10-CM | POA: Diagnosis not present

## 2019-04-05 DIAGNOSIS — E1151 Type 2 diabetes mellitus with diabetic peripheral angiopathy without gangrene: Secondary | ICD-10-CM | POA: Diagnosis not present

## 2019-04-05 DIAGNOSIS — K7030 Alcoholic cirrhosis of liver without ascites: Secondary | ICD-10-CM | POA: Diagnosis not present

## 2019-04-05 DIAGNOSIS — K810 Acute cholecystitis: Secondary | ICD-10-CM | POA: Diagnosis not present

## 2019-04-06 ENCOUNTER — Other Ambulatory Visit: Payer: Self-pay | Admitting: Internal Medicine

## 2019-04-06 ENCOUNTER — Encounter: Payer: Self-pay | Admitting: Internal Medicine

## 2019-04-06 MED ORDER — POTASSIUM CHLORIDE 20 MEQ PO PACK: 20.0000 meq | PACK | Freq: Every day | ORAL | 1 refills | Status: DC

## 2019-04-07 MED ORDER — POTASSIUM CHLORIDE CRYS ER 20 MEQ PO TBCR: 20.0000 meq | EXTENDED_RELEASE_TABLET | Freq: Every day | ORAL | 1 refills | Status: DC

## 2019-04-07 NOTE — Telephone Encounter (Signed)
Okay to change the Packs of K+ to the tablet form?

## 2019-04-07 NOTE — Addendum Note (Signed)
Addended by: Karle Barr on: 04/07/2019 02:29 PM   Modules accepted: Orders

## 2019-04-08 DIAGNOSIS — I10 Essential (primary) hypertension: Secondary | ICD-10-CM | POA: Diagnosis not present

## 2019-04-08 DIAGNOSIS — K7030 Alcoholic cirrhosis of liver without ascites: Secondary | ICD-10-CM | POA: Diagnosis not present

## 2019-04-08 DIAGNOSIS — K766 Portal hypertension: Secondary | ICD-10-CM | POA: Diagnosis not present

## 2019-04-08 DIAGNOSIS — K810 Acute cholecystitis: Secondary | ICD-10-CM | POA: Diagnosis not present

## 2019-04-08 DIAGNOSIS — K652 Spontaneous bacterial peritonitis: Secondary | ICD-10-CM | POA: Diagnosis not present

## 2019-04-08 DIAGNOSIS — E1151 Type 2 diabetes mellitus with diabetic peripheral angiopathy without gangrene: Secondary | ICD-10-CM | POA: Diagnosis not present

## 2019-04-11 ENCOUNTER — Telehealth: Payer: Self-pay

## 2019-04-11 DIAGNOSIS — K652 Spontaneous bacterial peritonitis: Secondary | ICD-10-CM | POA: Diagnosis not present

## 2019-04-11 DIAGNOSIS — K810 Acute cholecystitis: Secondary | ICD-10-CM | POA: Diagnosis not present

## 2019-04-11 DIAGNOSIS — K7030 Alcoholic cirrhosis of liver without ascites: Secondary | ICD-10-CM | POA: Diagnosis not present

## 2019-04-11 DIAGNOSIS — E1151 Type 2 diabetes mellitus with diabetic peripheral angiopathy without gangrene: Secondary | ICD-10-CM | POA: Diagnosis not present

## 2019-04-11 DIAGNOSIS — I10 Essential (primary) hypertension: Secondary | ICD-10-CM | POA: Diagnosis not present

## 2019-04-11 DIAGNOSIS — K766 Portal hypertension: Secondary | ICD-10-CM | POA: Diagnosis not present

## 2019-04-11 NOTE — Telephone Encounter (Signed)
Telephone call to patient to schedule palliative care visit with patient. Patient/family in agreement with home visit on 04-14-19 at 10:30AM.

## 2019-04-13 DIAGNOSIS — Z961 Presence of intraocular lens: Secondary | ICD-10-CM | POA: Diagnosis not present

## 2019-04-13 DIAGNOSIS — E119 Type 2 diabetes mellitus without complications: Secondary | ICD-10-CM | POA: Diagnosis not present

## 2019-04-13 DIAGNOSIS — Z794 Long term (current) use of insulin: Secondary | ICD-10-CM | POA: Diagnosis not present

## 2019-04-13 LAB — HM DIABETES EYE EXAM

## 2019-04-14 ENCOUNTER — Other Ambulatory Visit: Payer: Medicare Other

## 2019-04-14 ENCOUNTER — Other Ambulatory Visit: Payer: Self-pay

## 2019-04-14 ENCOUNTER — Encounter: Payer: Medicare Other | Attending: Internal Medicine | Admitting: Registered"

## 2019-04-14 ENCOUNTER — Encounter: Payer: Self-pay | Admitting: Registered"

## 2019-04-14 DIAGNOSIS — Z794 Long term (current) use of insulin: Secondary | ICD-10-CM | POA: Insufficient documentation

## 2019-04-14 DIAGNOSIS — Z515 Encounter for palliative care: Secondary | ICD-10-CM

## 2019-04-14 DIAGNOSIS — E118 Type 2 diabetes mellitus with unspecified complications: Secondary | ICD-10-CM

## 2019-04-14 NOTE — Progress Notes (Signed)
COMMUNITY PALLIATIVE CARE SW NOTE  PATIENT NAME: Patrick Kidd DOB: 10-Jul-1945 MRN: [id number]  PRIMARY CARE PROVIDER: [name], MD  RESPONSIBLE PARTY:  Acct ID - Guarantor Home Phone Work Phone Relationship Acct Type  [id number] - Patrick Kidd,[name] Self P/F     [address], [place], [place] [address]     PLAN OF CARE and INTERVENTIONS:             1. GOALS OF CARE/ ADVANCE CARE PLANNING:  Patient is FULL CODE. MOST is complete, SW to mail copy. Goal is for patient to go to the beach and walk on the sand.  2. SOCIAL/EMOTIONAL/SPIRITUAL ASSESSMENT/ INTERVENTIONS:  SW met with patient and Patrick Kidd (patient's wife) in the home. Patient reports doing "well". Patient has pain in his knees. Patient's appetite continues to improve, focused on regulating his diet. Patrick Kidd is very supportive. Patient checks his blood sugar once a day, and takes his insulin once a day. Patient is sleeping well, no concerns. Discussed patient's plan for going to the beach in a few weeks. Patient shared about [facility] basketball and watching games on television. SW provided emotional support, discussed care goals and used active and reflective listening.  3. PATIENT/CAREGIVER EDUCATION/ COPING:  Patient is alert, oriented. Patient openly expresses feelings with team. Denies concerns. Family is supportive.  4. PERSONAL EMERGENCY PLAN:  Family will call 9-1-1 for emergencies.  5. COMMUNITY RESOURCES COORDINATION/ HEALTH CARE NAVIGATION:  Patient and Patrick Kidd manage care. Patient's last PT visit will be today, patient said he will continue to do his exercises. Patient is scheduled to meet with nutritionist later today and looks forward to this. Patient is also scheduled to go see orthopaedic MD next week to get Cortizone shot in both knees.  6. FINANCIAL/LEGAL CONCERNS/INTERVENTIONS:  None.     SOCIAL HX:  Social History   Tobacco Use  . Smoking status: Former Smoker    Packs/day: 2.00    Years:  6.00    Pack years: 12.00    Types: Cigarettes    Quit date: 02/04/1971    Years since quitting: 48.2  . Smokeless tobacco: Never Used  . Tobacco comment: smoked age [age], up to 2 ppd  Substance Use Topics  . Alcohol use: Not Currently    Alcohol/week: 28.0 standard drinks    Types: 28 Shots of liquor per week    Comment: No alochol since Christmas Day 2020    CODE STATUS:   Code Status: Prior (FULL) ADVANCED DIRECTIVES: N MOST FORM COMPLETE:  Yes. HOSPICE EDUCATION PROVIDED: None.  PPS: Patient is independent of ADLs. Standby assist for safety when walking longer distances. When patient has weaker days, he will use his cane to ambulate.  I spent40mnutes with patient/family, from10:30-11:15aproviding education, support and consultation.  WMargaretmary Lombard LCSW

## 2019-04-14 NOTE — Patient Instructions (Signed)
Instructions/Goals:  -Have 3 meals per day: eat every 3-5 hours  -Recommend 3-4 carbohydrate choices/45-60 g per meal. Consistency is key.   -Check blood sugar fasting and 1 time 1-2 hours after a meal. May check after activity as well to see how it affects your blood sugar.

## 2019-04-14 NOTE — Progress Notes (Signed)
Diabetes Self-Management Education  Visit Type: First/Initial  Appt. Start Time: 1400 Appt. End Time: [id number]  04/20/2019  [name], identified by name and date of birth, is a 74 y.o. male with a diagnosis of Diabetes: Type 2.   ASSESSMENT  There were no vitals taken for this visit. There is no height or weight on file to calculate BMI.   Pt present for appointment with significant other. Reports significant other helps prepare pt's meals and helps pt with managing diabetes. Pt has neuropathy per significant other which is confirmed in chart. Per chart, pt has been dx with alcoholic cirrhosis.   Pt reports 300 units daily [place] first thing in the morning around 1030AM. Has been without over past 2 days due to needle breaking. Received samples today from his doctor to use while he is waiting for a new needle. Pt reports fasting blood sugar was 120 while on insulin-173 without. Checks blood sugar before taking long lasting insulin.  Usually has 2 meals per day. Pt reports including regular physical activity. Walks x 30 minutes and balance exercises x 15 minutes daily.  Before running out of insulin was between 99-120 04/15/19: 173 04/14/19: 154 01/31/19: 8.9 (hgba1c) 11/22/18: 6.7 (hgba1c)  Diabetes Self-Management Education - 04/14/19 1419      Visit Information   Visit Type  First/Initial      Initial Visit   Diabetes Type  Type 2    Are you currently following a meal plan?  No    Are you taking your medications as prescribed?  Yes    Date Diagnosed  2018      Health Coping   How would you rate your overall health?  Good      Psychosocial Assessment   Patient Belief/Attitude about Diabetes  Motivated to manage diabetes    Self-care barriers  None    Self-management support  Family    Other persons present  Patient;Spouse/SO    Patient Concerns  Nutrition/Meal planning    Special Needs  None    Learning Readiness  Ready    How often do you need to have someone  help you when you read instructions, pamphlets, or other written materials from your doctor or pharmacy?  --   S/O covers information for pt.   What is the last grade level you completed in school?  College      Pre-Education Assessment   Patient understands the diabetes disease and treatment process.  Needs Instruction    Patient understands incorporating nutritional management into lifestyle.  Needs Instruction    Patient undertands incorporating physical activity into lifestyle.  Needs Instruction    Patient understands using medications safely.  Demonstrates understanding / competency    Patient understands monitoring blood glucose, interpreting and using results  Needs Instruction    Patient understands prevention, detection, and treatment of acute complications.  Needs Instruction    Patient understands prevention, detection, and treatment of chronic complications.  Needs Instruction    Patient understands how to develop strategies to address psychosocial issues.  Needs Instruction    Patient understands how to develop strategies to promote health/change behavior.  Needs Instruction      Complications   Last HgB A1C per patient/outside source  8.9 %    How often do you check your blood sugar?  1-2 times/day    Fasting Blood glucose range (mg/dL)  130-179;70-129   Higher range during time he ran out of insulin   Postprandial Blood glucose range (  mg/dL)  --   Doesn't check after meals.   Number of hypoglycemic episodes per month  0    Number of hyperglycemic episodes per week  0    Have you had a dilated eye exam in the past 12 months?  Yes    Have you had a dental exam in the past 12 months?  Yes    Are you checking your feet?  No    How many days per week are you checking your feet?  --   0     Dietary Intake   Breakfast  1030 PM: bowl of Raisin bran, 2% milk    Snack (morning)  None reported.    Lunch  1 PM: apple    Snack (afternoon)  None reported.    Dinner  830 PM:  roasted pork tenderlion, vegetables (mushrooms, onions, potatoes, carrots), Diet Gingerale    Snack (evening)  None reported.    Beverage(s)  Diet Gingerale 3 x 12 oz      Exercise   Exercise Type  ADL's;Light (walking / raking leaves)    How many days per week to you exercise?  7    How many minutes per day do you exercise?  45    Total minutes per week of exercise  315      Patient Education   Previous Diabetes Education  Yes (please comment)   Pt took DSME class in the past, reports he prefers 1 on 1   Disease state   Factors that contribute to the development of diabetes;Definition of diabetes, type 1 and 2, and the diagnosis of diabetes    Nutrition management   Role of diet in the treatment of diabetes and the relationship between the three main macronutrients and blood glucose level;Food label reading, portion sizes and measuring food.;Carbohydrate counting    Physical activity and exercise   Role of exercise on diabetes management, blood pressure control and cardiac health.    Monitoring  Yearly dilated eye exam;Daily foot exams;Interpreting lab values - A1C, lipid, urine microalbumina.;Taught/discussed recording of test results and interpretation of SMBG.;Purpose and frequency of SMBG.    Acute complications  Taught treatment of hypoglycemia - the 15 rule.    Chronic complications  Dental care;Retinopathy and reason for yearly dilated eye exams;Relationship between chronic complications and blood glucose control;Reviewed with patient heart disease, higher risk of, and prevention      Individualized Goals (developed by patient)   Nutrition  Follow meal plan discussed;General guidelines for healthy choices and portions discussed    Physical Activity  Exercise 5-7 days per week;30 minutes per day    Medications  take my medication as prescribed    Monitoring   test my blood glucose as discussed    Reducing Risk  examine blood glucose patterns;do foot checks daily      Post-Education  Assessment   Patient understands the diabetes disease and treatment process.  Demonstrates understanding / competency    Patient understands incorporating nutritional management into lifestyle.  Demonstrates understanding / competency    Patient undertands incorporating physical activity into lifestyle.  Demonstrates understanding / competency    Patient understands using medications safely.  Demonstrates understanding / competency    Patient understands monitoring blood glucose, interpreting and using results  Demonstrates understanding / competency    Patient understands prevention, detection, and treatment of acute complications.  Needs Review    Patient understands prevention, detection, and treatment of chronic complications.  Needs Review  Patient understands how to develop strategies to address psychosocial issues.  Needs Review    Patient understands how to develop strategies to promote health/change behavior.  Demonstrates understanding / competency      Outcomes   Expected Outcomes  Demonstrated interest in learning. Expect positive outcomes    Future DMSE  2 wks    Program Status  Not Completed       Individualized Plan for Diabetes Self-Management Training:   Learning Objective:  Patient will have a greater understanding of diabetes self-management. Patient education plan is to attend individual and/or group sessions per assessed needs and concerns.   Plan: -Dietitian provided DSME. Will complete DSME at next visit as today's visit was for shorter period of time than full DSME appointment. Pt and significant other appeared agreeable to information discussed.   Instructions/Goals:  -Have 3 meals per day: eat every 3-5 hours  -Recommend 3-4 carbohydrate choices/45-60 g per meal. Consistency is key.   -Check blood sugar fasting and 1 time 1-2 hours after a meal. May check after activity as well to see how it affects your blood sugar.    Patient Instructions   Instructions/Goals:  -Have 3 meals per day: eat every 3-5 hours  -Recommend 3-4 carbohydrate choices/45-60 g per meal. Consistency is key.   -Check blood sugar fasting and 1 time 1-2 hours after a meal. May check after activity as well to see how it affects your blood sugar.       Expected Outcomes:  Demonstrated interest in learning. Expect positive outcomes  Education material provided: ADA - How to Thrive: A Guide for Your Journey with Diabetes, Meal plan card and My Plate  If problems or questions, patient to contact team via:  Phone and Email  Future DSME appointment: 2 wks

## 2019-04-15 DIAGNOSIS — K766 Portal hypertension: Secondary | ICD-10-CM | POA: Diagnosis not present

## 2019-04-15 DIAGNOSIS — E1151 Type 2 diabetes mellitus with diabetic peripheral angiopathy without gangrene: Secondary | ICD-10-CM | POA: Diagnosis not present

## 2019-04-15 DIAGNOSIS — K810 Acute cholecystitis: Secondary | ICD-10-CM | POA: Diagnosis not present

## 2019-04-15 DIAGNOSIS — K7030 Alcoholic cirrhosis of liver without ascites: Secondary | ICD-10-CM | POA: Diagnosis not present

## 2019-04-15 DIAGNOSIS — K652 Spontaneous bacterial peritonitis: Secondary | ICD-10-CM | POA: Diagnosis not present

## 2019-04-15 DIAGNOSIS — I10 Essential (primary) hypertension: Secondary | ICD-10-CM | POA: Diagnosis not present

## 2019-04-19 DIAGNOSIS — K766 Portal hypertension: Secondary | ICD-10-CM | POA: Diagnosis not present

## 2019-04-19 DIAGNOSIS — K7030 Alcoholic cirrhosis of liver without ascites: Secondary | ICD-10-CM | POA: Diagnosis not present

## 2019-04-19 DIAGNOSIS — E1151 Type 2 diabetes mellitus with diabetic peripheral angiopathy without gangrene: Secondary | ICD-10-CM | POA: Diagnosis not present

## 2019-04-19 DIAGNOSIS — K810 Acute cholecystitis: Secondary | ICD-10-CM | POA: Diagnosis not present

## 2019-04-19 DIAGNOSIS — K652 Spontaneous bacterial peritonitis: Secondary | ICD-10-CM | POA: Diagnosis not present

## 2019-04-19 DIAGNOSIS — I10 Essential (primary) hypertension: Secondary | ICD-10-CM | POA: Diagnosis not present

## 2019-04-21 DIAGNOSIS — M25561 Pain in right knee: Secondary | ICD-10-CM | POA: Diagnosis not present

## 2019-04-21 DIAGNOSIS — I10 Essential (primary) hypertension: Secondary | ICD-10-CM | POA: Diagnosis not present

## 2019-04-21 DIAGNOSIS — E1151 Type 2 diabetes mellitus with diabetic peripheral angiopathy without gangrene: Secondary | ICD-10-CM | POA: Diagnosis not present

## 2019-04-21 DIAGNOSIS — M25562 Pain in left knee: Secondary | ICD-10-CM | POA: Diagnosis not present

## 2019-04-21 DIAGNOSIS — K810 Acute cholecystitis: Secondary | ICD-10-CM | POA: Diagnosis not present

## 2019-04-21 DIAGNOSIS — K7030 Alcoholic cirrhosis of liver without ascites: Secondary | ICD-10-CM | POA: Diagnosis not present

## 2019-04-21 DIAGNOSIS — K652 Spontaneous bacterial peritonitis: Secondary | ICD-10-CM | POA: Diagnosis not present

## 2019-04-21 DIAGNOSIS — K766 Portal hypertension: Secondary | ICD-10-CM | POA: Diagnosis not present

## 2019-04-22 ENCOUNTER — Encounter: Payer: Self-pay | Admitting: Internal Medicine

## 2019-04-26 ENCOUNTER — Other Ambulatory Visit: Payer: Self-pay | Admitting: Internal Medicine

## 2019-04-26 ENCOUNTER — Telehealth: Payer: Self-pay | Admitting: Internal Medicine

## 2019-04-26 DIAGNOSIS — E785 Hyperlipidemia, unspecified: Secondary | ICD-10-CM

## 2019-04-26 MED ORDER — COOL BLOOD GLUCOSE TEST STRIPS VI STRP: ORAL_STRIP | 12 refills | Status: DC

## 2019-04-26 MED ORDER — ATORVASTATIN CALCIUM 10 MG PO TABS: 10.0000 mg | ORAL_TABLET | Freq: Every day | ORAL | 1 refills | Status: DC

## 2019-04-26 NOTE — Telephone Encounter (Signed)
   Patient calling to request samples of test strips, if not send request to [facility]  1.Medication Requested: glucose blood (COOL BLOOD GLUCOSE TEST STRIPS) test strip  2. Pharmacy (Name, Street, [place]): [id number] [place], [place]  3. On Med List: yes  4. Last Visit with PCP: 03/14/19  5. Next visit date with PCP: 06/13/19   Agent: Please be advised that RX refills may take up to 3 business days. We ask that you follow-up with your pharmacy.

## 2019-04-26 NOTE — Telephone Encounter (Signed)
Glucose test strips have been sent as requested. The last test strip that we had for patient was the Accuchek and that is what I sent.

## 2019-04-27 ENCOUNTER — Other Ambulatory Visit: Payer: Self-pay | Admitting: Internal Medicine

## 2019-04-27 ENCOUNTER — Encounter: Payer: Medicare Other | Admitting: Registered"

## 2019-04-27 ENCOUNTER — Other Ambulatory Visit: Payer: Self-pay

## 2019-04-27 DIAGNOSIS — E118 Type 2 diabetes mellitus with unspecified complications: Secondary | ICD-10-CM | POA: Diagnosis not present

## 2019-04-27 DIAGNOSIS — Z794 Long term (current) use of insulin: Secondary | ICD-10-CM

## 2019-04-27 NOTE — Progress Notes (Signed)
Diabetes Self-Management Education  Visit Type:    Appt. Start Time: 1525 Appt. End Time: [id number]  04/27/2019  [name], identified by name and date of birth, is a 74 y.o. male with a diagnosis of Diabetes:  .   ASSESSMENT  There were no vitals taken for this visit. There is no height or weight on file to calculate BMI.   Pt has been PMHx of neuropathy, alcoholic cirrhosis, HTN, HLD, GERD. Nutrition/DSME Follow-Up: Pt present for appointment with wife.  Pt's wife reports they have been making healthy changes since last appointment- including whole grain bread and avocado, whole grain waffles with sugar free syrup, and using ground [place] in a recipe in place of beef. Pt has tried eating a plain Special K cereal in place of Raisin Bran to reduce carbohydrates at breakfast to stay within his carbohydrate range (3-4 carb choices per meal). Pt reports he does not like the new cereal as he thinks it tastes like cardboard. Pt prefers the Raisin Bran. Pt's wife reports concern that pt still does not eat until late morning. Pt reports not eating within one hour of waking due to lack of appetite. Usually eats around 1030 AM even if he wakes at 8:30 AM. Pt reports drinking the regular V8 drink in the morning which is very high in sodium. Reports he has tried the low sodium version but it wasn't as good. Wife reports they purchased the [place] water. She reports concern that pt does not drink enough water, mostly drinks diet soda. Pt reports drinking around 20 oz water daily.   Pt reports he checks his weight daily and it has been staying between 214-217 lb. Pt reports his fasting blood sugar readings have been WNL, between 110-130, reports 106 this morning. Pt reports his postprandial readings have been between 160-180. Reports rarely gets over 180, only during time he was without insulin for 5 days prior to last appointment.   Pt reports he continues to include daily physical activity which  includes walking: 40-50 minutes x 7 days and climbing stairs twice daily as well as stretches. Pt reports he has been able to increase his activity.   Pt has questions about the hgbA1c test. Wants to know when he should have it rechecked.   Pertinent Lab Values: 01/31/19: HgbA1c 8.9  11/22/18: HgbA1c 6.7  24 Hour Recall: Breakfast (AM): Special K cereal, 1 banana  Snk (AM): apple  Lunch: 1 piece whole wheat bread with cream cheese and olive as a sandwich, Diet Gingerale  Snk (PM): Boiled peanuts  Snk (~730PM): cheese Dinner (830 PM): [place] Stew x 2 bowls (corn, green beans, chicken, potatoes, tomatoes), Triscuits x 6   Individualized Plan for Diabetes Self-Management Training:   Learning Objective:  Patient will have a greater understanding of diabetes self-management. Patient education plan is to attend individual and/or group sessions per assessed needs and concerns.   Plan: -Dietitian assessed if pt or wife had any questions from DSME information covered at last appointment. Answered pt's questions regarding hgbA1c. Discussed that pt could have it checked at any time since last time was end of December as the test shows past 2-3 month average. Discussed working to have consistent amount of carbohydrates at each meal-can go up or down from 3-4 choices per meal, being consistent is most important. Discussed that the goal is not to eat things we can't stand but to find foods we enjoy that will also meet our nutrition goals. Discussed trying Special K Protein  cinnamon cereal-pt was agreeable to trying but would prefer the Raisin Bran. Discussed that if pt eats the Raisin Bran and postprandial blood sugar remains within range, can continue with Raisin Bran. Pt very agreeable to making this assessment. Discussed if not feeling like having a full breakfast within an hour of eating, can try a balanced snack. Discussed different balanced breakfast ideas. Discussed reducing amount of V8 or doing  the reduced sodium version due to high sodium content of regular V8. Discussed increasing water intake by starting with 1 more glass per day and as we increase water intake to reduce diet soda intake. Praised pt for getting in daily physical activity. Praised beneficial changes pt has made. Pt appeared agreeable to information/goals discussed.   Instructions/Goals:  -Have 3 meals per day: eat every 3-5 hours  -Recommend 3-4 carbohydrate choices/45-60 g per meal. Consistency is key.   -Try to eat within 1 hour of waking, even if just a balanced snack size   -Fiber in grains: look for at least 3 g per serving  -Check blood sugar fasting and 1 time 1-2 hours after a meal. May check after activity as well to see how it affects your blood sugar.   -Work to add more water-start with 1 more glass of water and down with diet drink. Ultimate goal 48-64 oz water.   Breakfast Ideas:  [place] yogurt with fruit, nuts  Avocado toast with fruit and eggs  Special K Protein Cereals  If you do not like these cereals and want to stick with Raisin Bran, can do that. Recommend checking blood sugar 1-2 hours after to see how it affects your blood sugar.  If having V8, recommend choosing low sodium or having half a cup your serving (3-4 oz) at a time.   Continue with regular physical activity. Doing great!   Patient Instructions  Instructions/Goals:  -Have 3 meals per day: eat every 3-5 hours  -Recommend 3-4 carbohydrate choices/45-60 g per meal. Consistency is key.   -Try to eat within 1 hour of waking, even if just a balanced snack size   -Fiber in grains: look for at least 3 g per serving  -Check blood sugar fasting and 1 time 1-2 hours after a meal. May check after activity as well to see how it affects your blood sugar.   -Work to add more water-start with 1 more glass of water and down with diet drink. Ultimate goal 48-64 oz water.   Breakfast Ideas:  [place] yogurt with fruit,  nuts  Avocado toast with fruit and eggs  Special K Protein Cereals  If you do not like these cereals and want to stick with Raisin Bran, can do that. Recommend checking blood sugar 1-2 hours after to see how it affects your blood sugar.  If having V8, recommend choosing low sodium or having half a cup your serving (3-4 oz) at a time.   Continue with regular physical activity. Doing great!    Expected Outcomes:   Expect positive outcome.   If problems or questions, patient to contact team via:  Phone and Email  Future DSME appointment:  1 month.

## 2019-04-27 NOTE — Patient Instructions (Addendum)
Instructions/Goals:  -Have 3 meals per day: eat every 3-5 hours  -Recommend 3-4 carbohydrate choices/45-60 g per meal. Consistency is key.   -Try to eat within 1 hour of waking, even if just a balanced snack size   -Fiber in grains: look for at least 3 g per serving  -Check blood sugar fasting and 1 time 1-2 hours after a meal. May check after activity as well to see how it affects your blood sugar.   -Work to add more water-start with 1 more glass of water and down with diet drink. Ultimate goal 48-64 oz water.   Breakfast Ideas:  [place] yogurt with fruit, nuts  Avocado toast with fruit and eggs  Special K Protein Cereals  If you do not like these cereals and want to stick with Raisin Bran, can do that. Recommend checking blood sugar 1-2 hours after to see how it affects your blood sugar.  If having V8, recommend choosing low sodium or having half a cup your serving (3-4 oz) at a time.   Continue with regular physical activity. Doing great!

## 2019-04-28 ENCOUNTER — Other Ambulatory Visit: Payer: Self-pay | Admitting: Internal Medicine

## 2019-04-28 MED ORDER — LACTULOSE 10 GM/15ML PO SOLN: 10.0000 g | Freq: Two times a day (BID) | ORAL | 11 refills | Status: DC

## 2019-04-29 MED ORDER — COOL BLOOD GLUCOSE TEST STRIPS VI STRP: ORAL_STRIP | 3 refills | Status: DC

## 2019-04-29 NOTE — Addendum Note (Signed)
Addended by: Aviva Signs M on: 04/29/2019 12:02 PM   Modules accepted: Orders

## 2019-04-29 NOTE — Telephone Encounter (Signed)
Ex has been resent.

## 2019-04-29 NOTE — Telephone Encounter (Signed)
Patient calling and states that his pharmacy does not have any record of this being sent. States that he is completely out of strip. Could this be resent? Thank you

## 2019-05-03 ENCOUNTER — Encounter: Payer: Self-pay | Admitting: Registered"

## 2019-05-13 ENCOUNTER — Telehealth: Payer: Self-pay | Admitting: Internal Medicine

## 2019-05-13 DIAGNOSIS — R351 Nocturia: Secondary | ICD-10-CM

## 2019-05-13 DIAGNOSIS — N401 Enlarged prostate with lower urinary tract symptoms: Secondary | ICD-10-CM

## 2019-05-13 NOTE — Chronic Care Management (AMB) (Signed)
  Chronic Care Management   Note  05/13/2019 Name: Patrick Kidd MRN: [id number] DOB: 06-12-1945  Patrick Kidd is a 74 y.o. year old male who is a primary care patient of [name], MD. I reached out to Darron Doom by phone today in response to a referral sent by [name]'s PCP, [name], MD.   [name] was given information about Chronic Care Management services today including:  1. CCM service includes personalized support from designated clinical staff supervised by his physician, including individualized plan of care and coordination with other care providers 2. 24/7 contact phone numbers for assistance for urgent and routine care needs. 3. Service will only be billed when office clinical staff spend 20 minutes or more in a month to coordinate care. 4. Only one practitioner may furnish and bill the service in a calendar month. 5. The patient may stop CCM services at any time (effective at the end of the month) by phone call to the office staff.   Patient agreed to services and verbal consent obtained.   Follow up plan:   [name] [employer] Scheduler

## 2019-05-17 ENCOUNTER — Telehealth: Payer: Self-pay

## 2019-05-17 NOTE — Telephone Encounter (Signed)
Telephone call to patient to schedule palliative care visit.  Patient in agreement with palliative care team making home visit 05/24/19 at 1:00 PM.

## 2019-05-24 ENCOUNTER — Other Ambulatory Visit: Payer: Medicare Other

## 2019-05-24 ENCOUNTER — Other Ambulatory Visit: Payer: Self-pay

## 2019-05-24 DIAGNOSIS — Z515 Encounter for palliative care: Secondary | ICD-10-CM

## 2019-05-24 NOTE — Progress Notes (Signed)
PATIENT NAME: Patrick Kidd DOB: 1945-07-25 MRN: [id number]  PRIMARY CARE PROVIDER: [name], MD  RESPONSIBLE PARTY:  Acct ID - Guarantor Home Phone Work Phone Relationship Acct Type  [id number] - [place]367 [phone] Self P/F     [address], [place], [place] [address]    PLAN OF CARE and INTERVENTIONS:               1.  GOALS OF CARE/ ADVANCE CARE PLANNING:  Remain in home with wife and function independently.               2.  PATIENT/CAREGIVER EDUCATION:  Education on s/s of infection, education on fall precautions, reviewed meds, support               3.  DISEASE STATUS: SW and RN made scheduled palliative care home visit. Palliative care team met with patient. Patient reports he is doing good and denies having pain at the present time. Patient reports he has been walking around the block once a day and this does get him fatigued. Patient has not had any MD appointments in the past  2-3 weeks. Patient states he is scheduled to see PCP for 3 month follow up on May 6th. Patient reports his current weight is 214 lbs. Patient has received both of his CoVid-19 vaccines. Patient denies having any suffering any recent falls. Patient denies having any shortness of breath but does report having a dry cough that he feels his allergy related. Patient reports Home Health has finished and he is no longer receiving [place]. Patient states he has not drank alcohol since Christmas. Patient continues to have ascites. Patients breath sounds are clear and patient denies shortness of breath. Patient has no edema in his lower extremities. Patient reports he has been sleeping 6-8 hours of sleep every night and takes a nap occasionally. Patient's vital signs are stable. Nurse reviewed patient's medications with patient. Patient is stable and requests to be discharged from palliative care services. Patient states he does not want to receive NP visits for palliative care.  Patient encouraged should he have a change in status to reach out to palliative care team. Patient encouraged to call with questions or concerns.    HISTORY OF PRESENT ILLNESS: Patient is a 74 year old male who resides in home with wife. Patient is stable and has completed home health services.  Patient wishes to discontinue palliative care services due to stability.    CODE STATUS: Full Code  ADVANCED DIRECTIVES: No MOST FORM: No PPS: 60%   PHYSICAL EXAM:   VITALS: Today's Vitals   05/24/19 1320  BP: 122/62  Pulse: 75  Resp: 16  Temp: 98.2 F (36.8 C)  TempSrc: Temporal  SpO2: 99%  Weight: 214 lb (97.1 kg)  PainSc: 0-No pain    LUNGS: clear to auscultation  CARDIAC: Cor RRR  EXTREMITIES: non-pitting edema SKIN: Skin color, texture, turgor normal. No rashes or lesions  NEURO: Alert and oriented x 4       Nilda Simmer, RN

## 2019-05-24 NOTE — Progress Notes (Signed)
COMMUNITY PALLIATIVE CARE SW NOTE  PATIENT NAME: Patrick Kidd DOB: 08/28/1945 MRN: [id number]  PRIMARY CARE PROVIDER: [name], MD  RESPONSIBLE PARTY:  Acct ID - Guarantor Home Phone Work Phone Relationship Acct Type  [id number] - [place]320 [phone] Self P/F     [address], [place], [place] [address]     PLAN OF CARE and INTERVENTIONS:             1. GOALS OF CARE/ ADVANCE CARE PLANNING:  Patient is FULL CODE. MOST is complete. Goal is for patient to go to the beach and walk on the sand.  2. SOCIAL/EMOTIONAL/SPIRITUAL ASSESSMENT/ INTERVENTIONS:  SW met with patient in the home. Patient reports doing "great". Patient's appetite is good. Patient checks his blood sugar once a day, and takes his insulin once a day. No concerns. Patient is sleeping well, occasionally naps during the day. Discussed patient's plans for going summer - desire to golf, go to the pool and go to the beach. Patient talked about his love for golf and going to the [employer] championship for over 50 years. Patient is looking forward to seeing his grandchildren this summer. SW provided emotional support, discussed goals and used active and reflective listening. 3. PATIENT/CAREGIVER EDUCATION/ COPING:  Patient is alert, oriented.Patient openly expresses feelings, denies concerns. Family is very supportive. 4. PERSONAL EMERGENCY PLAN:  Family will call 9-1-1 for emergencies. 5. COMMUNITY RESOURCES COORDINATION/ HEALTH CARE NAVIGATION:  Patient/family manage care, no concerns. 6. FINANCIAL/LEGAL CONCERNS/INTERVENTIONS:  None.     SOCIAL HX:  Social History   Tobacco Use  . Smoking status: Former Smoker    Packs/day: 2.00    Years: 6.00    Pack years: 12.00    Types: Cigarettes    Quit date: 02/04/1971    Years since quitting: 48.3  . Smokeless tobacco: Never Used  . Tobacco comment: smoked age [age], up to 2 ppd  Substance Use Topics  . Alcohol use: Not Currently   Alcohol/week: 28.0 standard drinks    Types: 28 Shots of liquor per week    Comment: No alochol since Christmas Day 2020    CODE STATUS: FULL CODE ADVANCED DIRECTIVES: N MOST FORM COMPLETE:  Yes HOSPICE EDUCATION PROVIDED: None.  PPS: Patient is independent of ADLs.   I spent95mnutes with patient/family, from1:00-1:45pproviding education, support and consultation.  Due to patient's stability, patient will no longer be followed by palliative care at this time.    WMargaretmary Lombard LCSW

## 2019-06-06 ENCOUNTER — Encounter: Payer: Self-pay | Admitting: Internal Medicine

## 2019-06-06 ENCOUNTER — Other Ambulatory Visit: Payer: Self-pay

## 2019-06-06 MED ORDER — CIPROFLOXACIN HCL 500 MG PO TABS: 500.0000 mg | ORAL_TABLET | Freq: Every day | ORAL | 3 refills | Status: DC

## 2019-06-07 ENCOUNTER — Telehealth: Payer: Medicare Other

## 2019-06-09 ENCOUNTER — Other Ambulatory Visit: Payer: Self-pay

## 2019-06-09 ENCOUNTER — Encounter: Payer: Self-pay | Admitting: Internal Medicine

## 2019-06-09 ENCOUNTER — Ambulatory Visit (INDEPENDENT_AMBULATORY_CARE_PROVIDER_SITE_OTHER): Payer: Medicare Other | Admitting: Internal Medicine

## 2019-06-09 VITALS — BP 124/60 | HR 92 | Temp 99.1°F | Ht 74.0 in | Wt 223.0 lb

## 2019-06-09 DIAGNOSIS — E1169 Type 2 diabetes mellitus with other specified complication: Secondary | ICD-10-CM | POA: Diagnosis not present

## 2019-06-09 DIAGNOSIS — F04 Amnestic disorder due to known physiological condition: Secondary | ICD-10-CM | POA: Diagnosis not present

## 2019-06-09 DIAGNOSIS — I10 Essential (primary) hypertension: Secondary | ICD-10-CM

## 2019-06-09 DIAGNOSIS — J301 Allergic rhinitis due to pollen: Secondary | ICD-10-CM

## 2019-06-09 DIAGNOSIS — K7031 Alcoholic cirrhosis of liver with ascites: Secondary | ICD-10-CM

## 2019-06-09 DIAGNOSIS — K7682 Hepatic encephalopathy: Secondary | ICD-10-CM

## 2019-06-09 DIAGNOSIS — Z794 Long term (current) use of insulin: Secondary | ICD-10-CM

## 2019-06-09 DIAGNOSIS — K7290 Hepatic failure, unspecified without coma: Secondary | ICD-10-CM | POA: Diagnosis not present

## 2019-06-09 DIAGNOSIS — M10372 Gout due to renal impairment, left ankle and foot: Secondary | ICD-10-CM | POA: Insufficient documentation

## 2019-06-09 DIAGNOSIS — E538 Deficiency of other specified B group vitamins: Secondary | ICD-10-CM | POA: Diagnosis not present

## 2019-06-09 DIAGNOSIS — E118 Type 2 diabetes mellitus with unspecified complications: Secondary | ICD-10-CM

## 2019-06-09 DIAGNOSIS — J309 Allergic rhinitis, unspecified: Secondary | ICD-10-CM | POA: Insufficient documentation

## 2019-06-09 DIAGNOSIS — N521 Erectile dysfunction due to diseases classified elsewhere: Secondary | ICD-10-CM | POA: Diagnosis not present

## 2019-06-09 LAB — BASIC METABOLIC PANEL
BUN: 21 mg/dL (ref 6–23)
CO2: 30 mEq/L (ref 19–32)
Calcium: 9.7 mg/dL (ref 8.4–10.5)
Chloride: 104 mEq/L (ref 96–112)
Creatinine, Ser: 1.1 mg/dL (ref 0.40–1.50)
GFR: 65.32 mL/min (ref >60.00)
Glucose, Bld: 177 mg/dL — ABNORMAL HIGH (ref 70–99)
Potassium: 4.5 mEq/L (ref 3.5–5.1)
Sodium: 135 mEq/L (ref 135–145)

## 2019-06-09 LAB — HEPATIC FUNCTION PANEL
ALT: 12 U/L (ref 0–53)
AST: 17 U/L (ref 0–37)
Albumin: 4 g/dL (ref 3.5–5.2)
Alkaline Phosphatase: 59 U/L (ref 39–117)
Bilirubin, Direct: 0.2 mg/dL (ref 0.0–0.3)
Total Bilirubin: 1 mg/dL (ref 0.2–1.2)
Total Protein: 6.8 g/dL (ref 6.0–8.3)

## 2019-06-09 LAB — CBC WITH DIFFERENTIAL/PLATELET
Basophils Absolute: 0 10*3/uL (ref 0.0–0.1)
Basophils Relative: 0.4 % (ref 0.0–3.0)
Eosinophils Absolute: 0.1 10*3/uL (ref 0.0–0.7)
Eosinophils Relative: 1.2 % (ref 0.0–5.0)
HCT: 37.2 % — ABNORMAL LOW (ref 39.0–52.0)
Hemoglobin: 12.9 g/dL — ABNORMAL LOW (ref 13.0–17.0)
Lymphocytes Relative: 41.1 % (ref 12.0–46.0)
Lymphs Abs: 2.8 10*3/uL (ref 0.7–4.0)
MCHC: 34.6 g/dL (ref 30.0–36.0)
MCV: 89.6 fl (ref 78.0–100.0)
Monocytes Absolute: 0.5 10*3/uL (ref 0.1–1.0)
Monocytes Relative: 7.3 % (ref 3.0–12.0)
Neutro Abs: 3.4 10*3/uL (ref 1.4–7.7)
Neutrophils Relative %: 50 % (ref 43.0–77.0)
Platelets: 99 10*3/uL — ABNORMAL LOW (ref 150.0–400.0)
RBC: 4.16 Mil/uL — ABNORMAL LOW (ref 4.22–5.81)
RDW: 13.4 % (ref 11.5–15.5)
WBC: 6.9 10*3/uL (ref 4.0–10.5)

## 2019-06-09 LAB — HEMOGLOBIN A1C: Hgb A1c MFr Bld: 6.8 % — ABNORMAL HIGH (ref 4.6–6.5)

## 2019-06-09 LAB — URIC ACID: Uric Acid, Serum: 7.7 mg/dL (ref 4.0–7.8)

## 2019-06-09 LAB — PROTIME-INR
INR: 1.1 ratio — ABNORMAL HIGH (ref 0.8–1.0)
Prothrombin Time: 12.8 s (ref 9.6–13.1)

## 2019-06-09 MED ORDER — SILDENAFIL CITRATE 20 MG PO TABS: 40.0000 mg | ORAL_TABLET | Freq: Every day | ORAL | 4 refills | Status: DC | PRN

## 2019-06-09 MED ORDER — AZELASTINE HCL 0.1 % NA SOLN: 2.0000 | Freq: Two times a day (BID) | NASAL | 1 refills | Status: DC

## 2019-06-09 MED ORDER — COLCHICINE 0.6 MG PO CAPS: 1.0000 | ORAL_CAPSULE | Freq: Every day | ORAL | 0 refills | Status: DC

## 2019-06-09 NOTE — Patient Instructions (Signed)
Gout  Gout is a condition that causes painful swelling of the joints. Gout is a type of inflammation of the joints (arthritis). This condition is caused by having too much uric acid in the body. Uric acid is a chemical that forms when the body breaks down substances called purines. Purines are important for building body proteins. When the body has too much uric acid, sharp crystals can form and build up inside the joints. This causes pain and swelling. Gout attacks can happen quickly and may be very painful (acute gout). Over time, the attacks can affect more joints and become more frequent (chronic gout). Gout can also cause uric acid to build up under the skin and inside the kidneys. What are the causes? This condition is caused by too much uric acid in your blood. This can happen because:  Your kidneys do not remove enough uric acid from your blood. This is the most common cause.  Your body makes too much uric acid. This can happen with some cancers and cancer treatments. It can also occur if your body is breaking down too many red blood cells (hemolytic anemia).  You eat too many foods that are high in purines. These foods include organ meats and some seafood. Alcohol, especially beer, is also high in purines. A gout attack may be triggered by trauma or stress. What increases the risk? You are more likely to develop this condition if you:  Have a family history of gout.  Are male and middle-aged.  Are male and have gone through menopause.  Are obese.  Frequently drink alcohol, especially beer.  Are dehydrated.  Lose weight too quickly.  Have an organ transplant.  Have lead poisoning.  Take certain medicines, including aspirin, cyclosporine, diuretics, levodopa, and niacin.  Have kidney disease.  Have a skin condition called psoriasis. What are the signs or symptoms? An attack of acute gout happens quickly. It usually occurs in just one joint. The most common place is  the big toe. Attacks often start at night. Other joints that may be affected include joints of the feet, ankle, knee, fingers, wrist, or elbow. Symptoms of this condition may include:  Severe pain.  Warmth.  Swelling.  Stiffness.  Tenderness. The affected joint may be very painful to touch.  Shiny, red, or purple skin.  Chills and fever. Chronic gout may cause symptoms more frequently. More joints may be involved. You may also have white or yellow lumps (tophi) on your hands or feet or in other areas near your joints. How is this diagnosed? This condition is diagnosed based on your symptoms, medical history, and physical exam. You may have tests, such as:  Blood tests to measure uric acid levels.  Removal of joint fluid with a thin needle (aspiration) to look for uric acid crystals.  X-rays to look for joint damage. How is this treated? Treatment for this condition has two phases: treating an acute attack and preventing future attacks. Acute gout treatment may include medicines to reduce pain and swelling, including:  NSAIDs.  Steroids. These are strong anti-inflammatory medicines that can be taken by mouth (orally) or injected into a joint.  Colchicine. This medicine relieves pain and swelling when it is taken soon after an attack. It can be given by mouth or through an IV. Preventive treatment may include:  Daily use of smaller doses of NSAIDs or colchicine.  Use of a medicine that reduces uric acid levels in your blood.  Changes to your diet. You may   need to see a dietitian about what to eat and drink to prevent gout. Follow these instructions at home: During a gout attack   If directed, put ice on the affected area: ? Put ice in a plastic bag. ? Place a towel between your skin and the bag. ? Leave the ice on for 20 minutes, 2-3 times a day.  Raise (elevate) the affected joint above the level of your heart as often as possible.  Rest the joint as much as possible.  If the affected joint is in your leg, you may be given crutches to use.  Follow instructions from your health care provider about eating or drinking restrictions. Avoiding future gout attacks  Follow a low-purine diet as told by your dietitian or health care provider. Avoid foods and drinks that are high in purines, including liver, kidney, anchovies, asparagus, herring, mushrooms, mussels, and beer.  Maintain a healthy weight or lose weight if you are overweight. If you want to lose weight, talk with your health care provider. It is important that you do not lose weight too quickly.  Start or maintain an exercise program as told by your health care provider. Eating and drinking  Drink enough fluids to keep your urine pale yellow.  If you drink alcohol: ? Limit how much you use to:  0-1 drink a day for women.  0-2 drinks a day for men. ? Be aware of how much alcohol is in your drink. In the [place] one drink equals one 12 oz bottle of beer (355 mL) one 5 oz glass of wine (148 mL), or one 1 oz glass of hard liquor (44 mL). General instructions  Take over-the-counter and prescription medicines only as told by your health care provider.  Do not drive or use heavy machinery while taking prescription pain medicine.  Return to your normal activities as told by your health care provider. Ask your health care provider what activities are safe for you.  Keep all follow-up visits as told by your health care provider. This is important. Contact a health care provider if you have:  Another gout attack.  Continuing symptoms of a gout attack after 10 days of treatment.  Side effects from your medicines.  Chills or a fever.  Burning pain when you urinate.  Pain in your lower back or belly. Get help right away if you:  Have severe or uncontrolled pain.  Cannot urinate. Summary  Gout is painful swelling of the joints caused by inflammation.  The most common site of pain is the big  toe, but it can affect other joints in the body.  Medicines and dietary changes can help to prevent and treat gout attacks. This information is not intended to replace advice given to you by your health care provider. Make sure you discuss any questions you have with your health care provider. Document Revised: 08/12/2017 Document Reviewed: 08/12/2017 [employer]vier Patient Education  2020 [employer]  

## 2019-06-09 NOTE — Progress Notes (Signed)
Subjective:  Patient ID: Patrick Kidd, male    DOB: 1945/07/01  Age: 74 y.o. MRN: [id number]  CC: Anemia, Hypertension, and Diabetes  This visit occurred during the SARS-CoV-2 public health emergency.  Safety protocols were in place, including screening questions prior to the visit, additional usage of staff PPE, and extensive cleaning of exam room while observing appropriate contact time as indicated for disinfecting solutions.    HPI AARIB PULIDO presents for f/up - He complains that 2 days ago he developed nontraumatic pain and swelling in his left MTP joint.  He said this reminds him of previous episodes of gout.  Fortunately, over the last 2 days the symptoms have resolved.  He did not take anything for the pain and is not currently taking anything for gout.  He complains of a chronic runny nose, postnasal drip and throat clearing.  He denies cough, chest pain, shortness of breath, lymphadenopathy, or hemoptysis.  He has continues to abstain from alcohol intake and wants to do a follow-up on his liver disease.  Outpatient Medications Prior to Visit  Medication Sig Dispense Refill  . atenolol (TENORMIN) 25 MG tablet Take one tablet as needed for heart racing/palpitations.  May take up to 2 tablets in 24 hours. 60 tablet 3  . atorvastatin (LIPITOR) 10 MG tablet Take 1 tablet (10 mg total) by mouth daily. 90 tablet 1  . blood glucose meter kit and supplies KIT Use to test blood sugar once daily. DX E11.8 1 each 0  . Blood Glucose Monitoring Suppl (FREESTYLE FREEDOM LITE) w/Device KIT Use to check blood sugars 2 times daily 1 each 1  . ciprofloxacin (CIPRO) 500 MG tablet Take 1 tablet (500 mg total) by mouth daily. 30 tablet 3  . COVID-19 mRNA vaccine, Moderna, 100 MCG/0.5ML SUSP Moderna COVID-19 Vaccine (PF) 100 mcg/0.5 mL intramuscular susp. (EUA)  PHARMACY ADMINISTERED    . cyclobenzaprine (FLEXERIL) 5 MG tablet Take 1 to 2 tabs at bedtime if needed. 30 tablet 1  . folic acid  (FOLVITE) 1 MG tablet Take 1 tablet (1 mg total) by mouth daily. 90 tablet 1  . furosemide (LASIX) 20 MG tablet Take 1 tablet (20 mg total) by mouth daily. 90 tablet 1  . gabapentin (NEURONTIN) 300 MG capsule Take 1 capsule (300 mg total) by mouth 2 (two) times daily. 180 capsule 1  . glucose blood (COOL BLOOD GLUCOSE TEST STRIPS) test strip Use to check blood sugar daily. DX: E11.8 300 each 3  . Insulin Glargine, 2 Unit Dial, (TOUJEO MAX SOLOSTAR) 300 UNIT/ML SOPN Inject 30 Units into the skin daily. (Patient not taking: Reported on 03/31/2019) 3 mL 3  . KLOR-CON 20 MEQ packet DISSOLVE CONTENTS OF 1 PACKET AND DRINK DAILY 90 each 2  . lactulose (CHRONULAC) 10 GM/15ML solution Take 15 mLs (10 g total) by mouth 2 (two) times daily. 946 mL 11  . Lancets (ACCU-CHEK MULTICLIX) lancets Use to check sugar daily. DX: E11.8 100 each 12  . lisinopril (ZESTRIL) 10 MG tablet Take 1 tablet (10 mg total) by mouth daily. 90 tablet 1  . metFORMIN (GLUCOPHAGE) 500 MG tablet Take 1 tablet (500 mg total) by mouth 2 (two) times daily with a meal. 180 tablet 1  . Multiple Vitamin (MULTIVITAMIN WITH MINERALS) TABS tablet Take 1 tablet by mouth daily.    . pantoprazole (PROTONIX) 40 MG tablet Take 1 tablet (40 mg total) by mouth daily. 90 tablet 1  . potassium chloride SA (KLOR-CON) 20 MEQ  tablet Take 1 tablet (20 mEq total) by mouth daily. 90 tablet 1  . rifaximin (XIFAXAN) 550 MG TABS tablet Take 1 tablet (550 mg total) by mouth 2 (two) times daily. 180 tablet 3  . thiamine 100 MG tablet Take 1 tablet (100 mg total) by mouth daily.     No facility-administered medications prior to visit.    ROS Review of Systems  Constitutional: Positive for unexpected weight change (wt gain). Negative for chills, diaphoresis, fatigue and fever.  HENT: Positive for postnasal drip and rhinorrhea. Negative for congestion, sinus pressure, sneezing, sore throat, trouble swallowing and voice change.   Eyes: Negative.   Respiratory:  Negative for cough, chest tightness, shortness of breath and wheezing.   Cardiovascular: Negative for chest pain and leg swelling.  Gastrointestinal: Negative for abdominal pain, blood in stool, constipation, nausea and vomiting.  Endocrine: Negative.   Genitourinary: Negative.  Negative for difficulty urinating, hematuria and scrotal swelling.       ++ED  Musculoskeletal: Positive for arthralgias. Negative for myalgias.  Skin: Negative.   Neurological: Negative.  Negative for dizziness, weakness and headaches.  Hematological: Negative for adenopathy. Does not bruise/bleed easily.  Psychiatric/Behavioral: Negative.     Objective:  BP 124/60 (BP Location: Left Arm, Patient Position: Sitting, Cuff Size: Large)   Pulse 92   Temp 99.1 F (37.3 C) (Oral)   Ht 6' 2"  (1.88 m)   Wt 223 lb (101.2 kg)   SpO2 97%   BMI 28.63 kg/m   BP Readings from Last 3 Encounters:  06/09/19 124/60  05/24/19 122/62  03/31/19 100/65    Wt Readings from Last 3 Encounters:  06/09/19 223 lb (101.2 kg)  05/24/19 214 lb (97.1 kg)  03/31/19 223 lb (101.2 kg)    Physical Exam Vitals reviewed.  Constitutional:      Appearance: Normal appearance.  HENT:     Right Ear: Tympanic membrane normal.     Nose: Mucosal edema present. No congestion or rhinorrhea.     Right Nostril: No epistaxis.     Left Nostril: No epistaxis.     Right Sinus: No maxillary sinus tenderness or frontal sinus tenderness.     Left Sinus: No maxillary sinus tenderness or frontal sinus tenderness.     Mouth/Throat:     Mouth: Mucous membranes are moist.  Eyes:     General: No scleral icterus.    Conjunctiva/sclera: Conjunctivae normal.  Cardiovascular:     Rate and Rhythm: Normal rate and regular rhythm.     Pulses:          Dorsalis pedis pulses are 1+ on the right side and 1+ on the left side.       Posterior tibial pulses are 1+ on the right side and 1+ on the left side.     Heart sounds: No murmur.  Pulmonary:      Effort: Pulmonary effort is normal.     Breath sounds: No stridor. No wheezing, rhonchi or rales.  Abdominal:     General: Abdomen is flat.     Palpations: There is no mass.     Tenderness: There is no abdominal tenderness. There is no guarding.  Musculoskeletal:     Cervical back: Neck supple.       Feet:  Feet:     Right foot:     Skin integrity: Skin integrity normal. No erythema or warmth.     Left foot:     Skin integrity: Skin integrity normal. No erythema or  warmth.  Lymphadenopathy:     Cervical: No cervical adenopathy.  Neurological:     Mental Status: He is alert.     Lab Results  Component Value Date   WBC 6.9 06/09/2019   HGB 12.9 (L) 06/09/2019   HCT 37.2 (L) 06/09/2019   PLT 99.0 (L) 06/09/2019   GLUCOSE 177 (H) 06/09/2019   CHOL 153 03/14/2019   TRIG 134.0 03/14/2019   HDL 45.00 03/14/2019   LDLCALC 81 03/14/2019   ALT 12 06/09/2019   AST 17 06/09/2019   NA 135 06/09/2019   K 4.5 06/09/2019   CL 104 06/09/2019   CREATININE 1.10 06/09/2019   BUN 21 06/09/2019   CO2 30 06/09/2019   TSH 1.830 01/29/2019   PSA 0.84 10/07/2018   INR 1.1 (H) 06/09/2019   HGBA1C 6.8 (H) 06/09/2019   MICROALBUR 3.4 (H) 03/14/2019    CT Abdomen Pelvis W Contrast  Result Date: 01/28/2019 CLINICAL DATA:  Fall right flank hematoma EXAM: CT ABDOMEN AND PELVIS WITH CONTRAST TECHNIQUE: Multidetector CT imaging of the abdomen and pelvis was performed using the standard protocol following bolus administration of intravenous contrast. CONTRAST:  122m OMNIPAQUE IOHEXOL 300 MG/ML  SOLN COMPARISON:  July 27, 2018 FINDINGS: Lower chest: The visualized heart size within normal limits. No pericardial fluid/thickening. No hiatal hernia. Streaky atelectasis seen at both lung bases. Hepatobiliary: Again noted is hepatomegaly with a slightly nodular liver contour, consistent with cirrhosis. There is a low-density lesion in the posterior right liver lobe measuring 6.2 cm, unchanged from the  prior CT. The gallbladder appears to be fluid-filled and mildly distended with a small amount of pericholecystic fluid and fat stranding changes. No definite layering gallstones however are present. No biliary ductal dilatation. Pancreas: Unremarkable. No pancreatic ductal dilatation or surrounding inflammatory changes. Spleen: Normal in size without focal abnormality. Adrenals/Urinary Tract: Both adrenal glands appear normal. The kidneys and collecting system appear normal without evidence of urinary tract calculus or hydronephrosis. Bladder is unremarkable. Stomach/Bowel: The stomach, small bowel, and colon are normal in appearance. No inflammatory changes, wall thickening, or obstructive findings.Scattered colonic diverticula are noted without diverticulitis. The appendix is unremarkable. Vascular/Lymphatic: There are no enlarged mesenteric, retroperitoneal, or pelvic lymph nodes. A small amount of perihepatic ascites is noted. Scattered aortic atherosclerotic calcifications are seen without aneurysmal dilatation. Reproductive: The prostate contains radiation prostate seeds. Other: No evidence of abdominal wall mass or hernia. Musculoskeletal: No acute or significant osseous findings. IMPRESSION: Mildly distended gallbladder with a small amount of pericholecystic fluid and surrounding inflammatory changes. This could be due to cholecystitis. If further evaluation is required would recommend right upper quadrant ultrasound Hepatomegaly and findings suggestive of cirrhosis. Small perihepatic ascites Aortic Atherosclerosis (ICD10-I70.0). Unchanged 6 cm hepatic cyst. Electronically Signed   By: BPrudencio PairM.D.   On: 01/28/2019 23:23   DG Chest Port 1 View  Result Date: 01/28/2019 CLINICAL DATA:  Shortness of breath, diagnosed with COVID-19 on 01/03/2019, increased fatigue, jaundice, cirrhosis EXAM: PORTABLE CHEST 1 VIEW COMPARISON:  Portable exam 1759 hours compared to 02/19/2018 FINDINGS: Normal heart size,  mediastinal contours, and pulmonary vascularity. Atherosclerotic calcification aorta. Mild LEFT basilar atelectasis. Lungs otherwise clear. No pulmonary infiltrate, pleural effusion or pneumothorax. Multilevel endplate spur formation thoracic spine. IMPRESSION: Mild LEFT basilar atelectasis. Electronically Signed   By: MLavonia DanaM.D.   On: 01/28/2019 18:17   ECHOCARDIOGRAM COMPLETE  Result Date: 01/29/2019   ECHOCARDIOGRAM REPORT   Patient Name:   LANDRE SWANDERDate of Exam: 01/29/2019  Medical Rec #:  [id number]          Height:       74.0 in Accession #:    [id number]         Weight:       235.0 lb Date of Birth:  1945-08-23          BSA:          2.33 m Patient Age:    7 years           BP:           134/68 mmHg Patient Gender: M                  HR:           117 bpm. Exam Location:  Inpatient Procedure: 2D Echo and Intracardiac Opacification Agent Indications:    Elevated troponin; I10 Hypertension  History:        Patient has no prior history of Echocardiogram examinations.                 Signs/Symptoms:Altered Mental Status. Covid19.  Sonographer:    Merrie Roof RDCS Referring Phys: [place]  1. Left ventricular ejection fraction, by visual estimation, is 60 to 65%. The left ventricle has normal function. There is no left ventricular hypertrophy.  2. Definity contrast agent was given IV to delineate the left ventricular endocardial borders.  3. Left ventricular diastolic parameters are consistent with Grade I diastolic dysfunction (impaired relaxation).  4. The left ventricle has no regional wall motion abnormalities.  5. Global right ventricle has normal systolic function.The right ventricular size is normal. No increase in right ventricular wall thickness.  6. Left atrial size was normal.  7. Right atrial size was normal.  8. The mitral valve is normal in structure. No evidence of mitral valve regurgitation. No evidence of mitral stenosis.  9. The tricuspid valve is normal in  structure. 10. The aortic valve is normal in structure. Aortic valve regurgitation is not visualized. No evidence of aortic valve sclerosis or stenosis. 11. The pulmonic valve was normal in structure. Pulmonic valve regurgitation is not visualized. 12. The inferior vena cava is normal in size with greater than 50% respiratory variability, suggesting right atrial pressure of 3 mmHg. FINDINGS  Left Ventricle: Left ventricular ejection fraction, by visual estimation, is 60 to 65%. The left ventricle has normal function. Definity contrast agent was given IV to delineate the left ventricular endocardial borders. The left ventricle has no regional wall motion abnormalities. There is no left ventricular hypertrophy. Left ventricular diastolic parameters are consistent with Grade I diastolic dysfunction (impaired relaxation). Normal left atrial pressure. Right Ventricle: The right ventricular size is normal. No increase in right ventricular wall thickness. Global RV systolic function is has normal systolic function. Left Atrium: Left atrial size was normal in size. Right Atrium: Right atrial size was normal in size Pericardium: There is no evidence of pericardial effusion. Mitral Valve: The mitral valve is normal in structure. No evidence of mitral valve regurgitation. No evidence of mitral valve stenosis by observation. Tricuspid Valve: The tricuspid valve is normal in structure. Tricuspid valve regurgitation is not demonstrated. Aortic Valve: The aortic valve is normal in structure. Aortic valve regurgitation is not visualized. The aortic valve is structurally normal, with no evidence of sclerosis or stenosis. Pulmonic Valve: The pulmonic valve was normal in structure. Pulmonic valve regurgitation is not visualized. Pulmonic regurgitation is not visualized. Aorta: The aortic root,  ascending aorta and aortic arch are all structurally normal, with no evidence of dilitation or obstruction. Venous: The inferior vena cava is  normal in size with greater than 50% respiratory variability, suggesting right atrial pressure of 3 mmHg. IAS/Shunts: No atrial level shunt detected by color flow Doppler. There is no evidence of a patent foramen ovale. No ventricular septal defect is seen or detected. There is no evidence of an atrial septal defect.  LEFT VENTRICLE PLAX 2D LVIDd:         4.90 cm LVIDs:         3.20 cm LV PW:         1.10 cm LV IVS:        1.10 cm LVOT diam:     1.90 cm LV SV:         72 ml LV SV Index:   30.16 LVOT Area:     2.84 cm  LEFT ATRIUM           Index LA diam:      4.10 cm 1.76 cm/m LA Vol (A2C): 23.7 ml 10.18 ml/m LA Vol (A4C): 53.9 ml 23.16 ml/m  AORTIC VALVE LVOT Vmax:   150.00 cm/s LVOT Vmean:  7010.000 cm/s  SHUNTS Systemic Diam: 1.90 cm  Candee Furbish MD Electronically signed by Candee Furbish MD Signature Date/Time: 01/29/2019/3:45:26 PM    Final    US Abdomen Limited RUQ  Result Date: 01/29/2019 CLINICAL DATA:  Jaundice EXAM: ULTRASOUND ABDOMEN LIMITED RIGHT UPPER QUADRANT COMPARISON:  None. FINDINGS: Gallbladder: Layering sludge/stones seen within gallbladder there is a edematous mildly prominent gallbladder wall measuring 3.1 mm. No sonographic Murphy sign. Common bile duct: Diameter: 3.7 mm Liver: Increased echotexture seen throughout. There is an anechoic cyst within the right liver lobe measuring 7.3 x 5.7 x 5.1 cm. The main portal vein appears patent. Other: None. IMPRESSION: Layering sludge/stones with mild gallbladder wall thickening. This could be due to mild acute cholecystitis. Electronically Signed   By: Prudencio Pair M.D.   On: 01/29/2019 01:55    Assessment & Plan:   Caitlin was seen today for anemia, hypertension and diabetes.  Diagnoses and all orders for this visit:  Essential hypertension- His blood pressure is adequately well controlled. -     CBC with Differential/Platelet; Future -     CBC with Differential/Platelet  Type 2 diabetes mellitus with complication, with long-term  current use of insulin ([place])- His blood sugar is adequately well controlled. -     Basic metabolic panel; Future -     Hemoglobin A1c; Future -     Hemoglobin A1c -     Basic metabolic panel  Thiamine deficiency with Wernicke-Korsakoff syndrome in adult [facility])- His anemia has improved.  I will recheck his vitamin B1 level.  He also likely has the anemia of chronic disease. -     CBC with Differential/Platelet; Future -     Vitamin B1; Future -     Vitamin B1 -     CBC with Differential/Platelet  Encephalopathy, hepatic ([place])- Improvement noted.  He was praised from abstaining from alcohol intake.  His MELD score is only eight so as long as he does not start drinking again I do not expect any complications for this over the next year. -     Hepatic function panel; Future -     Protime-INR; Future -     Protime-INR -     Hepatic function panel  Acute gout due to renal impairment involving  left foot- His uric acid level is elevated.  I think this is the cause for the pain in the first MTP joint.  I will avoid NSAIDs due to his history of renal and hepatic dise  I do not think it is serious enough to require course of steroids.  I recommended the treating this with colchicine. -     Uric acid; Future -     Colchicine (MITIGARE) 0.6 MG CAPS; Take 1 tablet by mouth daily. -     Uric acid  Seasonal allergic rhinitis due to pollen -     azelastine (ASTELIN) 0.1 % nasal spray; Place 2 sprays into both nostrils 2 (two) times daily. Use in each nostril as directed  Erectile dysfunction due to diabetes mellitus ([place])- Will try low dose viagra. -     sildenafil (REVATIO) 20 MG tablet; Take 2 tablets (40 mg total) by mouth daily as needed.   I am having Hollice Espy B. Bradly "[name]" start on azelastine, Colchicine, and sildenafil. I am also having him maintain his FreeStyle Freedom Lite, blood glucose meter kit and supplies, accu-chek multiclix, cyclobenzaprine, thiamine, multivitamin with minerals,  COVID-19 mRNA vaccine (Moderna), Toujeo Max SoloStar, atenolol, metFORMIN, gabapentin, furosemide, folic acid, pantoprazole, lisinopril, rifaximin, Klor-Con, potassium chloride SA, atorvastatin, lactulose, Cool Blood Glucose Test Strips, and ciprofloxacin.  Meds ordered this encounter  Medications  . azelastine (ASTELIN) 0.1 % nasal spray    Sig: Place 2 sprays into both nostrils 2 (two) times daily. Use in each nostril as directed    Dispense:  90 mL    Refill:  1  . Colchicine (MITIGARE) 0.6 MG CAPS    Sig: Take 1 tablet by mouth daily.    Dispense:  90 capsule    Refill:  0  . sildenafil (REVATIO) 20 MG tablet    Sig: Take 2 tablets (40 mg total) by mouth daily as needed.    Dispense:  40 tablet    Refill:  4   I spent 50 minutes in preparing to see the patient by review of recent labs, imaging and procedures, obtaining and reviewing separately obtained history, communicating with the patient and family or caregiver, ordering medications, tests or procedures, and documenting clinical information in the EHR including the differential Dx, treatment, and any further evaluation and other management of 1. Essential hypertension 2. Type 2 diabetes mellitus with complication, with long-term current use of insulin ([place]) 3. 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 Encephalopathy, hepatic ([place]) 5. Acute gout due to renal impairment involving left foot 6. Seasonal allergic rhinitis due to pollen 7. Erectile dysfunction due to diabetes mellitus ([place])     Follow-up: Return in about 3 months (around 09/09/2019).  Scarlette Calico, MD

## 2019-06-11 ENCOUNTER — Encounter: Payer: Self-pay | Admitting: Internal Medicine

## 2019-06-13 ENCOUNTER — Encounter: Payer: Self-pay | Admitting: Internal Medicine

## 2019-06-13 ENCOUNTER — Ambulatory Visit: Payer: Medicare Other | Admitting: Internal Medicine

## 2019-06-14 LAB — VITAMIN B1: Vitamin B1 (Thiamine): 7 nmol/L — ABNORMAL LOW (ref 8–30)

## 2019-06-16 ENCOUNTER — Encounter: Payer: Self-pay | Admitting: Internal Medicine

## 2019-06-16 ENCOUNTER — Other Ambulatory Visit: Payer: Self-pay | Admitting: Internal Medicine

## 2019-06-16 DIAGNOSIS — E538 Deficiency of other specified B group vitamins: Secondary | ICD-10-CM

## 2019-06-16 DIAGNOSIS — E519 Thiamine deficiency, unspecified: Secondary | ICD-10-CM

## 2019-06-16 MED ORDER — THIAMINE HCL 100 MG PO TABS: 100.0000 mg | ORAL_TABLET | ORAL | 1 refills | Status: DC

## 2019-06-27 ENCOUNTER — Ambulatory Visit: Payer: Medicare Other | Admitting: Registered"

## 2019-07-12 ENCOUNTER — Ambulatory Visit: Payer: Medicare Other | Admitting: Pharmacist

## 2019-07-12 ENCOUNTER — Other Ambulatory Visit: Payer: Self-pay

## 2019-07-12 DIAGNOSIS — Z794 Long term (current) use of insulin: Secondary | ICD-10-CM

## 2019-07-12 DIAGNOSIS — I10 Essential (primary) hypertension: Secondary | ICD-10-CM

## 2019-07-12 DIAGNOSIS — E118 Type 2 diabetes mellitus with unspecified complications: Secondary | ICD-10-CM

## 2019-07-12 DIAGNOSIS — E785 Hyperlipidemia, unspecified: Secondary | ICD-10-CM

## 2019-07-12 DIAGNOSIS — K7031 Alcoholic cirrhosis of liver with ascites: Secondary | ICD-10-CM

## 2019-07-12 NOTE — Chronic Care Management (AMB) (Signed)
Chronic Care Management Pharmacy  Name: Patrick Kidd  MRN: [id number] DOB: July 08, 1945  Chief Complaint/ HPI  Patrick Kidd,  74 y.o. , male presents for their Initial CCM visit with the clinical pharmacist via telephone due to COVID-19 Pandemic.  PCP : [name], MD  Their chronic conditions include: Hypertension, Hyperlipidemia, Diabetes, GERD, Overactive Bladder and BPH, alcoholic cirrhosis, DJD, prostate cancer, allergies, Peripheral vascular disease  Born in [place], married wife Patrick Kidd in 1979, married 56 years, 2 boys and 3 grandchildren. Lives [facility], goes to swimming pool.   Office Visits: 06/09/19 Dr Ronnald Ramp OV: gout flare resolved on its own. Uric acid elevated, started colchicine daily. Abstaining from alcohol, encephalopathy improved. MELD = 8. Rx'd Astelin for allergies, sildenafil 20-40 mg for ED.  04/22/19 crestor changed to lipitor for cost savings.  Consult Visit: 05/24/19 Home care visit: no changes 04/14/19 Dietician visit for DM. 04/13/19 DM eye exam. 01/28/19 - hospitalization for jaundice for 5 wks.  Allergies  Allergen Reactions  . 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  Use with caution  . Calcium Channel Blockers     Likely WPW.  Use with caution.    Medications: Outpatient Encounter Medications as of 07/12/2019  Medication Sig Note  . atorvastatin (LIPITOR) 10 MG tablet Take 1 tablet (10 mg total) by mouth daily.   Marland Kitchen azelastine (ASTELIN) 0.1 % nasal spray Place 2 sprays into both nostrils 2 (two) times daily. Use in each nostril as directed   . blood glucose meter kit and supplies KIT Use to test blood sugar once daily. DX E11.8   . Blood Glucose Monitoring Suppl (FREESTYLE FREEDOM LITE) w/Device KIT Use to check blood sugars 2 times daily   . ciprofloxacin (CIPRO) 500 MG tablet Take 1 tablet (500 mg total) by mouth daily.   . Colchicine (MITIGARE) 0.6 MG CAPS Take 1 tablet by mouth daily. (Patient taking differently: Take 1 tablet by  mouth daily. PRN for flares)   . folic acid (FOLVITE) 1 MG tablet Take 1 tablet (1 mg total) by mouth daily.   . furosemide (LASIX) 20 MG tablet Take 1 tablet (20 mg total) by mouth daily.   Marland Kitchen gabapentin (NEURONTIN) 300 MG capsule Take 1 capsule (300 mg total) by mouth 2 (two) times daily.   Marland Kitchen glucose blood (COOL BLOOD GLUCOSE TEST STRIPS) test strip Use to check blood sugar daily. DX: E11.8   . Insulin Glargine, 2 Unit Dial, (TOUJEO MAX SOLOSTAR) 300 UNIT/ML SOPN Inject 30 Units into the skin daily. 04/20/2019: Pt reports taking each morning.   . lactulose (CHRONULAC) 10 GM/15ML solution Take 15 mLs (10 g total) by mouth 2 (two) times daily.   . Lancets (ACCU-CHEK MULTICLIX) lancets Use to check sugar daily. DX: E11.8   . lisinopril (ZESTRIL) 10 MG tablet Take 1 tablet (10 mg total) by mouth daily.   . metFORMIN (GLUCOPHAGE) 500 MG tablet Take 1 tablet (500 mg total) by mouth 2 (two) times daily with a meal.   . Multiple Vitamin (MULTIVITAMIN WITH MINERALS) TABS tablet Take 1 tablet by mouth daily.   . pantoprazole (PROTONIX) 40 MG tablet Take 1 tablet (40 mg total) by mouth daily.   . potassium chloride SA (KLOR-CON) 20 MEQ tablet Take 1 tablet (20 mEq total) by mouth daily.   . sildenafil (REVATIO) 20 MG tablet Take 2 tablets (40 mg total) by mouth daily as needed.   . thiamine 100 MG tablet Take 1 tablet (100  mg total) by mouth every other day.   Marland Kitchen atenolol (TENORMIN) 25 MG tablet Take one tablet as needed for heart racing/palpitations.  May take up to 2 tablets in 24 hours. (Patient not taking: Reported on 07/12/2019)   . COVID-19 mRNA vaccine, Moderna, 100 MCG/0.5ML SUSP Moderna COVID-19 Vaccine (PF) 100 mcg/0.5 mL intramuscular susp. (EUA)  PHARMACY ADMINISTERED   . cyclobenzaprine (FLEXERIL) 5 MG tablet Take 1 to 2 tabs at bedtime if needed. (Patient not taking: Reported on 07/12/2019)   . KLOR-CON 20 MEQ packet DISSOLVE CONTENTS OF 1 PACKET AND DRINK DAILY (Patient not taking: Reported on  07/12/2019)   . rifaximin (XIFAXAN) 550 MG TABS tablet Take 1 tablet (550 mg total) by mouth 2 (two) times daily. (Patient not taking: Reported on 07/12/2019)   . [DISCONTINUED] potassium chloride (KLOR-CON) 20 MEQ packet Take 20 mEq by mouth daily.    No facility-administered encounter medications on file as of 07/12/2019.     Current Diagnosis/Assessment:  SDOH Interventions     Most Recent Value  SDOH Interventions  Alcohol Brief Interventions/Follow-up  AUDIT Score <7 follow-up not indicated      Goals Addressed            This Visit's Progress   . Pharmacy Care Plan       CARE PLAN ENTRY  Current Barriers:  . Chronic Disease Management support, education, and care coordination needs related to Hypertension, Hyperlipidemia, Diabetes, and Cirrhosis   Hypertension BP Readings from Last 3 Encounters:  06/09/19 124/60  05/24/19 122/62  03/31/19 100/65 .  Pharmacist Clinical Goal(s): o Over the next 90 days, patient will work with PharmD and providers to maintain BP goal <130/80 . Current regimen:  o Lisinopril 10 mg daily . Interventions: o Discussed BP goals and benefits of medication . Patient self care activities - Over the next 90 days, patient will: o Check BP as needed, document, and provide at future appointments o Ensure daily salt intake < 2300 mg/day  Hyperlipidemia Lab Results  Component Value Date/Time   LDLCALC 81 03/14/2019 10:36 AM .  Pharmacist Clinical Goal(s): o Over the next 90 days, patient will work with PharmD and providers to maintain LDL goal < 100 . Current regimen:  o Atorvastatin 10 mg daily . Interventions: o Discussed cholesterol goals and role of statin in cardiovascular disease (peripheral vascular disease) o Statin reduces risk for heart attack and stroke . Patient self care activities - Over the next 90 days, patient will: o Continue medication as prescribed o Continue low cholesterol diet and exercise routine  Diabetes Lab  Results  Component Value Date/Time   HGBA1C 6.8 (H) 06/09/2019 02:49 PM   HGBA1C 8.9 (H) 01/31/2019 06:00 AM .  Pharmacist Clinical Goal(s): o Over the next 90 days, patient will work with PharmD and providers to maintain A1c goal <7% . Current regimen:  o Toujeo Max 30 units daily o Metformin 500 mg BID . Interventions: o Discussed A1c goal and benefits of medications o Discussed progression of diabetes and low likelihood of control without insulin . Patient self care activities - Over the next 90 days, patient will: o Check blood sugar once daily and in the morning before eating or drinking, document, and provide at future appointments o Contact provider with any episodes of hypoglycemia  Cirrhosis . Pharmacist Clinical Goal(s) o Over the next 90 days, patient will work with PharmD and providers to optimize therapy . Current regimen:  o Lactulose 10 gm/43m twice a day o  Furosemide 20 mg daily o Potassium chloride 20 mEq daily o Ciprofloxacin 500 mg daily . Interventions: o Discussed benefit of lactulose for removal of ammonium and treatment of hepatic encephalopathy o Discussed benefit of furosemide for fluid removal and prevention of ascites - Furosemide causes potassium loss in the urine, so potassium supplement is needed with furosemide o Discussed role of ciprofloxacin in prevention of SBP (spontaneous bacterial peritonitis) in the ascites fluid o Recommended to follow up with gastroenterologist regarding length of therapy and liver prognosis . Patient self care activities - Over the next 90 days, patient will: o Continue medications as prescribed o Follow up with Dr. [name] regarding liver medications  Medication management . Pharmacist Clinical Goal(s): o Over the next 90 days, patient will work with PharmD and providers to maintain optimal medication adherence . [facility] . Interventions o Comprehensive medication review performed. o Continue current  medication management strategy . Patient self care activities - Over the next 90 days, patient will: o Focus on medication adherence by fill date o Take medications as prescribed o Report any questions or concerns to PharmD and/or provider(s)  Initial goal documentation       Hypertension   BP goal is:  <130/80  Office blood pressures are  BP Readings from Last 3 Encounters:  06/09/19 124/60  05/24/19 122/62  03/31/19 100/65   Kidney Function Lab Results  Component Value Date/Time   CREATININE 1.10 06/09/2019 02:49 PM   CREATININE 0.85 03/14/2019 10:36 AM   CREATININE 1.13 06/04/2010 01:54 PM   GFR 65.32 06/09/2019 02:49 PM   GFRNONAA >60 02/06/2019 05:46 AM   GFRAA >60 02/06/2019 05:46 AM   Patient checks BP at home infrequently Patient home BP readings are ranging: SBP 115-120 typically  Patient has failed these meds in the past: n/a Patient is currently controlled on the following medications:  . 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) - not taking . Lisinopril 10 mg daily  We discussed diet and exercise extensively; pt reports he has not had to take a dose of atenolol for palpitations.  Plan  Continue current medications     Hyperlipidemia/PVD   LDL goal < 100  Lipid Panel     Component Value Date/Time   CHOL 153 03/14/2019 1036   TRIG 134.0 03/14/2019 1036   HDL 45.00 03/14/2019 1036   LDLCALC 81 03/14/2019 1036     The 10-year ASCVD risk score Mikey Bussing DC Jr., et al., 2013) is: 41.3%   Values used to calculate the score:     Age: 34 years     Sex: Male     Is Non-Hispanic African American: No     Diabetic: Yes     Tobacco smoker: No     Systolic Blood Pressure: 660 mmHg     Is BP treated: Yes     HDL Cholesterol: 45 mg/dL     Total Cholesterol: 153 mg/dL   Patient has failed these meds in past: rosuvastatin Patient is currently controlled on the following medications:  . Atorvastatin 10 mg daily  We discussed:  diet and exercise extensively;  discussed benefit of statin in PVD, prevention of ASCVD. Pt reports he swims for an hour every day.  Plan  Continue current medications  Diabetes   A1c goal < 7%  Recent Relevant Labs: Lab Results  Component Value Date/Time   HGBA1C 6.8 (H) 06/09/2019 02:49 PM   HGBA1C 8.9 (H) 01/31/2019 06:00 AM   GFR 65.32 06/09/2019 02:49 PM  GFR 88.02 03/14/2019 10:36 AM   MICROALBUR 3.4 (H) 03/14/2019 10:36 AM   MICROALBUR 1.3 10/10/2016 08:29 AM    Last diabetic Eye exam:  Lab Results  Component Value Date/Time   HMDIABEYEEXA No Retinopathy 04/13/2019 12:00 AM    Last diabetic Foot exam: No results found for: HMDIABFOOTEX   Checking BG: Daily  Recent FBG Readings: 108  Patient has failed these meds in past: n/a Patient is currently controlled on the following medications: . Toujeo Max 30 units daily  . Metformin 500 mg BID  We discussed: diet and exercise extensively and how to recognize and treat signs of hypoglycemia  Plan  Continue current medications  GERD   Patient has failed these meds in past: n/a Patient is currently controlled on the following medications:  . Pantoprazole 40 mg daily  We discussed:  Pt reports he has significant GERD and cannot go without PPI.   Plan  Continue current medications  Neuropathy/DJD   Patient has failed these meds in past: cyclobenzaprine Patient is currently controlled on the following medications:  . Gabapentin 300 mg BID  We discussed:  Pain is controlled with current dose of gabapentin  Plan  Continue current medications  Cirrhosis   Patient has failed these meds in past: Xifaxin (cost) Patient is currently controlled on the following medications:  . Lactulose 10g/12m BID . Ciprofloxacin 500 mg daily (SBP ppx?) . Furosemide 20 mg daily . Potassium chloride 20 mEq daily  We discussed: role of each medication in cirrhosis/hepatic encephalopathy/SBP prophylaxis. Recommended to discuss length of therapy with GI  provider.  Plan  Continue current medications  Gout   Results for CNAWAF, STRANGE (MRN [id number] as of 07/12/2019 14:07  Ref. Range 06/09/2019 14:49  Uric Acid, Serum Latest Ref Range: 4.0 - 7.8 mg/dL 7.7   Patient has failed these meds in past: n/a Patient is currently controlled on the following medications:  . Colchicine 0.6 mg daily  We discussed:  Pt reports he does not take every day; only takes for flares.   Plan  Continue current medications  Allergies   Patient has failed these meds in past: n/a Patient is currently controlled on the following medications:  . Astelin 0.1% nasal spray PRN  We discussed:  Pt reports nasal spray has helped with dry cough some.   Plan  Continue current medications  Vitamin Deficiencies    Ref. Range 06/09/2019 14:49  Vitamin B1 (Thiamine) Latest Ref Range: 8 - 30 nmol/L 7 (L)    Ref. Range 03/14/2019 10:36  Folate Latest Ref Range: >5.9 ng/mL 21.3   Patient is currently controlled on the following medications:  . Folic acid 1 mg daily  Thiamine 100 mg QOD . Multivitamin   We discussed:  Vitamin deficiencies related to long term alcohol use  Plan  Continue current medications   Medication Management   [facility] Uses pill box? No - keeps meds on counter Pt endorses 100% compliance  We discussed: [facility] and pt is satisfied with their service.   Plan  Continue current medication management strategy    Follow up: 3 month phone visit  LCharlene Brooke PharmD Clinical Pharmacist [phone]

## 2019-07-12 NOTE — Patient Instructions (Addendum)
Visit Information  Phone number for Pharmacist: [phone]  Thank you for meeting with me to discuss your medications! I look forward to working with you to achieve your health care goals. Below is a summary of what we talked about during the visit:  Goals Addressed            This Visit's Progress   . Pharmacy Care Plan       CARE PLAN ENTRY  Current Barriers:  . Chronic Disease Management support, education, and care coordination needs related to Hypertension, Hyperlipidemia, Diabetes, and Cirrhosis   Hypertension BP Readings from Last 3 Encounters:  06/09/19 124/60  05/24/19 122/62  03/31/19 100/65 .  Pharmacist Clinical Goal(s): o Over the next 90 days, patient will work with PharmD and providers to maintain BP goal <130/80 . Current regimen:  o Lisinopril 10 mg daily . Interventions: o Discussed BP goals and benefits of medication . Patient self care activities - Over the next 90 days, patient will: o Check BP as needed, document, and provide at future appointments o Ensure daily salt intake < 2300 mg/day  Hyperlipidemia Lab Results  Component Value Date/Time   LDLCALC 81 03/14/2019 10:36 AM .  Pharmacist Clinical Goal(s): o Over the next 90 days, patient will work with PharmD and providers to maintain LDL goal < 100 . Current regimen:  o Atorvastatin 10 mg daily . Interventions: o Discussed cholesterol goals and role of statin in cardiovascular disease (peripheral vascular disease) o Statin reduces risk for heart attack and stroke . Patient self care activities - Over the next 90 days, patient will: o Continue medication as prescribed o Continue low cholesterol diet and exercise routine  Diabetes Lab Results  Component Value Date/Time   HGBA1C 6.8 (H) 06/09/2019 02:49 PM   HGBA1C 8.9 (H) 01/31/2019 06:00 AM .  Pharmacist Clinical Goal(s): o Over the next 90 days, patient will work with PharmD and providers to maintain A1c goal <7% . Current regimen:   o Toujeo Max 30 units daily o Metformin 500 mg BID . Interventions: o Discussed A1c goal and benefits of medications o Discussed progression of diabetes and low likelihood of control without insulin . Patient self care activities - Over the next 90 days, patient will: o Check blood sugar once daily and in the morning before eating or drinking, document, and provide at future appointments o Contact provider with any episodes of hypoglycemia  Cirrhosis . Pharmacist Clinical Goal(s) o Over the next 90 days, patient will work with PharmD and providers to optimize therapy . Current regimen:  o Lactulose 10 gm/26mL twice a day o Furosemide 20 mg daily o Potassium chloride 20 mEq daily o Ciprofloxacin 500 mg daily . Interventions: o Discussed benefit of lactulose for removal of ammonium and treatment of hepatic encephalopathy o Discussed benefit of furosemide for fluid removal and prevention of ascites - Furosemide causes potassium loss in the urine, so potassium supplement is needed with furosemide o Discussed role of ciprofloxacin in prevention of SBP (spontaneous bacterial peritonitis) in the ascites fluid o Recommended to follow up with gastroenterologist regarding length of therapy and liver prognosis . Patient self care activities - Over the next 90 days, patient will: o Continue medications as prescribed o Follow up with Dr. [name] regarding liver medications  Medication management . Pharmacist Clinical Goal(s): o Over the next 90 days, patient will work with PharmD and providers to maintain optimal medication adherence . [facility] . Interventions o Comprehensive medication review performed. o  Continue current medication management strategy . Patient self care activities - Over the next 90 days, patient will: o Focus on medication adherence by fill date o Take medications as prescribed o Report any questions or concerns to PharmD and/or provider(s)  Initial goal  documentation      Immunization History  Administered Date(s) Administered  . Fluad Quad(high Dose 65+) 01/30/2019  . Hep A / Hep B 11/02/2015  . Influenza, High Dose Seasonal PF 10/28/2016, 11/11/2017  . Influenza,inj,Quad PF,6+ Mos 01/10/2016  . Moderna SARS-COVID-2 Vaccination 02/13/2019, 03/16/2019  . Pneumococcal Conjugate-13 09/04/2015  . Pneumococcal Polysaccharide-23 06/05/2011, 03/14/2019  . Tdap 06/03/2009, 10/28/2016    Mr. Pitzer was given information about Chronic Care Management services today including:  1. CCM service includes personalized support from designated clinical staff supervised by his physician, including individualized plan of care and coordination with other care providers 2. 24/7 contact phone numbers for assistance for urgent and routine care needs. 3. Standard insurance, coinsurance, copays and deductibles apply for chronic care management only during months in which we provide at least 20 minutes of these services. Most insurances cover these services at 100%, however patients may be responsible for any copay, coinsurance and/or deductible if applicable. This service may help you avoid the need for more expensive face-to-face services. 4. Only one practitioner may furnish and bill the service in a calendar month. 5. The patient may stop CCM services at any time (effective at the end of the month) by phone call to the office staff.  Patient agreed to services and verbal consent obtained.   The patient verbalized understanding of instructions provided today and agreed to receive a mailed copy of patient instruction and/or educational materials. Telephone follow up appointment with pharmacy team member scheduled for: 3 months  Charlene Brooke, PharmD Clinical Pharmacist [facility] [phone]  Peritonitis  Peritonitis is inflammation of the tissue that lines the abdomen and covers the internal organs (peritoneum). Certain  conditions or injuries can cause organs to leak stool, bacteria, fungi, blood, or chemicals, such as bile or other digestive fluids, into the abdomen. When these substances come into contact with the peritoneum, they may cause irritation or infection. Peritonitis can be a life-threatening infection if not treated promptly. The infection can spread through the bloodstream and affect the whole body (sepsis). What are the causes? This condition may be caused by:  Infection of: ? The appendix (appendicitis). ? The pancreas (pancreatitis). ? Diverticula (diverticulitis). These are small pouches that form in the lining of the digestive tract. ? The lungs (tuberculosis).  Ulcers.  Crohn's disease or ulcerative colitis.  Cancer.  Liver disease. Other possible causes are:  Injury, such as injury to: ? The abdomen. ? The stomach. ? The esophagus.  Other infections inside the abdomen or pelvis.  Pregnancy outside the uterus (tubal pregnancy).  A procedure that is used to cleanse the blood when the kidneys have stopped working correctly (peritoneal dialysis). What are the signs or symptoms? Symptoms of this condition include:  Severe pain in the abdomen.  Hard-feeling abdomen.  Swelling in the abdomen.  Fever and chills.  Nausea and vomiting.  Poor appetite or no appetite.  Being unable to pass gas or stool (constipation).  Diarrhea.  Passing urine less often. How is this diagnosed? This condition may be diagnosed based on the results of a physical exam and medical history. Your health care provider may also do:  A test on fluid removed from the infected area (  paracentesis).  Lab tests such as blood, urine, or stool tests.  Imaging tests, such as X-ray, ultrasound, CT scan. How is this treated? Treatment for this condition includes treating the symptoms and treating the underlying cause. Usually this condition is treated in the hospital. Treatment may  include:  Antibiotic medicines.  Surgery to remove infected fluid and tissue.  Surgery to treat the condition that caused peritonitis, such as removing the appendix to treat appendicitis (appendectomy). Follow these instructions at home: Medicines  Take over-the-counter and prescription medicines only as told by your health care provider.  If you were prescribed an antibiotic medicine, take it as told by your health care provider. Do not stop taking the antibiotic even if you start to feel better.  If you were taking prescription medicines for other problems before you developed peritonitis, ask your health care provider when you should start taking these medicines again.  If told by your health care provider, use a stool softener or laxative. General instructions  Do not use any products that contain nicotine or tobacco, such as cigarettes and e-cigarettes. If you need help quitting, ask your health care provider.  Do not drink alcohol.  Follow your health care provider's instructions for diet and activity.  Drink enough fluid to keep your urine pale yellow.  Rest as much as possible.  Keep all follow-up visits as told by your health care provider. This is important. Contact a health care provider if:  You develop a fever or chills.  You are constipated.  You have nausea, vomiting, or diarrhea.  Your symptoms change or get worse. Get help right away if you:  Have new or worse pain in your abdomen.  Have new problems with passing urine.  Develop chest pains or shortness of breath.  Become very confused or drowsy.  Have jerky movements that you cannot control (seizures). Summary  Peritonitis is inflammation of the tissue that lines the abdomen and covers the internal organs (peritoneum).  Symptoms include pain and swelling in the abdomen, fever and chills, nausea and vomiting, poor appetite, constipation, diarrhea, or passing urine less often.  Treatment  includes treating the symptoms that you have and treating the underlying cause with medicine or surgery. This information is not intended to replace advice given to you by your health care provider. Make sure you discuss any questions you have with your health care provider. Document Revised: 03/27/2017 Document Reviewed: 02/24/2017 Elsevier Patient Education  2020 [employer].

## 2019-07-15 NOTE — Addendum Note (Signed)
Addended by: Aviva Signs M on: 07/15/2019 11:48 AM   Modules accepted: Orders

## 2019-07-19 ENCOUNTER — Ambulatory Visit (INDEPENDENT_AMBULATORY_CARE_PROVIDER_SITE_OTHER): Payer: Medicare Other | Admitting: Internal Medicine

## 2019-07-19 ENCOUNTER — Other Ambulatory Visit: Payer: Self-pay

## 2019-07-19 ENCOUNTER — Encounter: Payer: Self-pay | Admitting: Internal Medicine

## 2019-07-19 VITALS — BP 112/58 | HR 88 | Ht 74.0 in | Wt 244.0 lb

## 2019-07-19 DIAGNOSIS — I480 Paroxysmal atrial fibrillation: Secondary | ICD-10-CM

## 2019-07-19 DIAGNOSIS — I456 Pre-excitation syndrome: Secondary | ICD-10-CM

## 2019-07-19 NOTE — Progress Notes (Signed)
HPI [name] returns today for followup. He is a pleasant 73 yo man with atrial fib, and cirrhosis who has gradually improved since being hospitalized 6 months ago. In the interim, he has gradually begun to increase his physical activity. He is now able to walk without assistance. He has not had any falls. He denies chest pain or sob. He has very minimal edema. He carries a diagnosis of cirrhosis. He has a remote h/o WPW but is not pre-excited. He has been taking atenolol with out difficulty. He wonders if he needs to take zestril.  Allergies  Allergen Reactions  . 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  Use with caution  . Calcium Channel Blockers     Likely WPW.  Use with caution.     Current Outpatient Medications  Medication Sig Dispense Refill  . atenolol (TENORMIN) 25 MG tablet Take one tablet as needed for heart racing/palpitations.  May take up to 2 tablets in 24 hours. 60 tablet 3  . atorvastatin (LIPITOR) 10 MG tablet Take 1 tablet (10 mg total) by mouth daily. 90 tablet 1  . azelastine (ASTELIN) 0.1 % nasal spray Place 2 sprays into both nostrils 2 (two) times daily. Use in each nostril as directed 90 mL 1  . blood glucose meter kit and supplies KIT Use to test blood sugar once daily. DX E11.8 1 each 0  . Blood Glucose Monitoring Suppl (FREESTYLE FREEDOM LITE) w/Device KIT Use to check blood sugars 2 times daily 1 each 1  . ciprofloxacin (CIPRO) 500 MG tablet Take 1 tablet (500 mg total) by mouth daily. 30 tablet 3  . Colchicine (MITIGARE) 0.6 MG CAPS Take 1 tablet by mouth daily. 90 capsule 0  . COVID-19 mRNA vaccine, Moderna, 100 MCG/0.5ML SUSP Moderna COVID-19 Vaccine (PF) 100 mcg/0.5 mL intramuscular susp. (EUA)  PHARMACY ADMINISTERED    . cyclobenzaprine (FLEXERIL) 5 MG tablet Take 1 to 2 tabs at bedtime if needed. 30 tablet 1  . folic acid (FOLVITE) 1 MG tablet Take 1 tablet (1 mg total) by mouth daily. 90 tablet 1  . furosemide (LASIX) 20 MG tablet Take 1  tablet (20 mg total) by mouth daily. 90 tablet 1  . gabapentin (NEURONTIN) 300 MG capsule Take 1 capsule (300 mg total) by mouth 2 (two) times daily. 180 capsule 1  . glucose blood (COOL BLOOD GLUCOSE TEST STRIPS) test strip Use to check blood sugar daily. DX: E11.8 300 each 3  . Insulin Glargine, 2 Unit Dial, (TOUJEO MAX SOLOSTAR) 300 UNIT/ML SOPN Inject 30 Units into the skin daily. 3 mL 3  . KLOR-CON 20 MEQ packet DISSOLVE CONTENTS OF 1 PACKET AND DRINK DAILY 90 each 2  . lactulose (CHRONULAC) 10 GM/15ML solution Take 15 mLs (10 g total) by mouth 2 (two) times daily. 946 mL 11  . Lancets (ACCU-CHEK MULTICLIX) lancets Use to check sugar daily. DX: E11.8 100 each 12  . lisinopril (ZESTRIL) 10 MG tablet Take 1 tablet (10 mg total) by mouth daily. 90 tablet 1  . metFORMIN (GLUCOPHAGE) 500 MG tablet Take 1 tablet (500 mg total) by mouth 2 (two) times daily with a meal. 180 tablet 1  . Multiple Vitamin (MULTIVITAMIN WITH MINERALS) TABS tablet Take 1 tablet by mouth daily.    . pantoprazole (PROTONIX) 40 MG tablet Take 1 tablet (40 mg total) by mouth daily. 90 tablet 1  . potassium chloride SA (KLOR-CON) 20 MEQ tablet Take 1 tablet (20 mEq  total) by mouth daily. 90 tablet 1  . rifaximin (XIFAXAN) 550 MG TABS tablet Take 1 tablet (550 mg total) by mouth 2 (two) times daily. 180 tablet 3  . sildenafil (REVATIO) 20 MG tablet Take 2 tablets (40 mg total) by mouth daily as needed. 40 tablet 4  . thiamine 100 MG tablet Take 1 tablet (100 mg total) by mouth every other day. 45 tablet 1   No current facility-administered medications for this visit.     Past Medical History:  Diagnosis Date  . Acrophobia   . Alcoholic cirrhosis ([place]) 0/88/1103  . Anemia   . Arthritis    foot by big toe  . BPH associated with nocturia   . Cataract    removed both eyes  . Chronic cough    PMH of  . COVID-19 12/2018  . Diabetes mellitus without complication ([place])   . Diverticulosis 07-03-2010   Colonoscopy.   .  Duodenal ulcer 2017  . Fallen arches    Bilateral  . GERD (gastroesophageal reflux disease)   . Gout   . Granuloma annulare   . Hx of adenomatous colonic polyps multiple  . Hydrocele 2011   Large septated right hydrocele  . Liver cyst   . Liver lesion   . Nonspecific elevation of levels of transaminase or lactic acid dehydrogenase (LDH)   . Obesity   . Peripheral neuropathy   . Plantar fasciitis    PMH of  . Portal hypertension ([place]) 2017  . Prostate cancer ([place])   . Right shoulder pain 11/2017  . Sleep apnea    no cpap, patient denies  . Wears reading eyeglasses   . WPW (Wolff-Parkinson-White syndrome) 02/05/2019    ROS:   All systems reviewed and negative except as noted in the HPI.   Past Surgical History:  Procedure Laterality Date  . CATARACT EXTRACTION, BILATERAL  12/2011    Dr Gershon Crane  . COLONOSCOPY  2017  . COLONOSCOPY W/ POLYPECTOMY  07/03/2010   2 adenomas, diverticulosis on right. Dr [name]  . CYSTOSCOPY N/A 03/15/2018   Procedure: CYSTOSCOPY FLEXIBLE;  Surgeon: [name], MD;  Location: [facility];  Service: Urology;  Laterality: N/A;  NO SEEDS FOUND IN BLADDER  . ESOPHAGOGASTRODUODENOSCOPY (EGD) WITH PROPOFOL N/A 01/08/2016   Procedure: ESOPHAGOGASTRODUODENOSCOPY (EGD) WITH PROPOFOL;  Surgeon: [name], MD;  Location: [facility];  Service: Endoscopy;  Laterality: N/A;  . FLEXIBLE SIGMOIDOSCOPY  2000  . RADIOACTIVE SEED IMPLANT N/A 03/15/2018   Procedure: RADIOACTIVE SEED IMPLANT/BRACHYTHERAPY IMPLANT;  Surgeon: [name], MD;  Location: [facility];  Service: Urology;  Laterality: N/A;   72 SEEDS IMPLANTED  . SIGMOIDOSCOPY    . SPACE OAR INSTILLATION N/A 03/15/2018   Procedure: SPACE OAR INSTILLATION;  Surgeon: [name], MD;  Location: [facility];  Service: Urology;  Laterality: N/A;  . WISDOM TOOTH EXTRACTION       Family History  Problem Relation Age of Onset  . Lung  cancer Mother        smoker  . Leukemia Father        Acute myelocytic  . Diabetes Neg Hx   . Stroke Neg Hx   . Heart disease Neg Hx   . Colon cancer Neg Hx   . Colon polyps Neg Hx   . Esophageal cancer Neg Hx   . Rectal cancer Neg Hx   . Stomach cancer Neg Hx  Social History   Socioeconomic History  . Marital status: Married    Spouse name: [name]  . Number of children: 2  . Years of education: Not on file  . Highest education level: Some college, no degree  Occupational History  . Occupation: Adult nurse estate  Tobacco Use  . Smoking status: Former Smoker    Packs/day: 2.00    Years: 6.00    Pack years: 12.00    Types: Cigarettes    Quit date: 02/04/1971    Years since quitting: 48.4  . Smokeless tobacco: Never Used  . Tobacco comment: smoked age [age], up to 2 ppd  Vaping Use  . Vaping Use: Never used  Substance and Sexual Activity  . Alcohol use: Not Currently    Alcohol/week: 28.0 standard drinks    Types: 28 Shots of liquor per week    Comment: No alochol since Christmas Day 2020  . Drug use: No  . Sexual activity: Yes    Partners: Female  Other Topics Concern  . Not on file  Social History Narrative   Worked in Adult nurse estate   2 children   Former smoker no drug use   Alcoholism, abstinent since 01/23/2019 most recently   Fun/Hobby: Play golf, grandchildren, [employer]      Patient is left-handed. He lives with his wife in a one level home. He swims most days.   Social Determinants of Health   Financial Resource Strain:   . Difficulty of Paying Living Expenses:   Food Insecurity:   . Worried About Charity fundraiser in the Last Year:   . Arboriculturist in the Last Year:   Transportation Needs:   . Film/video editor (Medical):   Marland Kitchen Lack of Transportation (Non-Medical):   Physical Activity:   . Days of Exercise per Week:   . Minutes of Exercise per Session:   Stress:   . Feeling of Stress :   Social Connections:   . Frequency  of Communication with Friends and Family:   . Frequency of Social Gatherings with Friends and Family:   . Attends Religious Services:   . Active Member of Clubs or Organizations:   . Attends Archivist Meetings:   Marland Kitchen Marital Status:   Intimate Partner Violence:   . Fear of Current or Ex-Partner:   . Emotionally Abused:   Marland Kitchen Physically Abused:   . Sexually Abused:      BP (!) 112/58   Pulse 88   Ht '6\' 2"'$  (1.88 m)   Wt 244 lb (110.7 kg)   SpO2 94%   BMI 31.33 kg/m   Physical Exam:  Well appearing NAD HEENT: Unremarkable Neck:  No JVD, no thyromegally Lymphatics:  No adenopathy Back:  No CVA tenderness Lungs:  Clear with no wheezes HEART:  Regular rate rhythm, no murmurs, no rubs, no clicks Abd:  soft, positive bowel sounds, no organomegally, no rebound, no guarding Ext:  2 plus pulses, no edema, no cyanosis, no clubbing Skin:  No rashes no nodules Neuro:  CN II through XII intact, motor grossly intact  DEVICE  Normal device function.  See PaceArt for details.   Assess/Plan: 1. Atrial fib - he has not had any symptoms since he was in the hospital. He will continue low dose atenolol. 2. HTN -his bp is much improved. I have asked him to stop his zestril. 3. Dyslipidemia - he has been on a statin. In light of his cirrhosis, I have recommended  that he stop the statin. 4. Liver failure/ETOH abuse - he appears to be improving. He will continue his current meds. I strongly encouraged him to never again consume any ETOH.  Patrick Kidd.D.

## 2019-07-19 NOTE — Patient Instructions (Addendum)
Medication Instructions:  Your physician has recommended you make the following change in your medication:   1.  Stop taking lipitor  2.  Stop taking lisinopril   Labwork: None ordered.  Testing/Procedures: None ordered.  Follow-Up: Your physician wants you to follow-up in: 6 months with Dr. Lovena Le.    January 20, 2020 at 1:45 pm  Any Other Special Instructions Will Be Listed Below (If Applicable).  If you need a refill on your cardiac medications before your next appointment, please call your pharmacy.

## 2019-08-04 ENCOUNTER — Ambulatory Visit: Payer: Self-pay | Admitting: Pharmacist

## 2019-08-04 NOTE — Chronic Care Management (AMB) (Signed)
Patient reports he has lost his Part D insurance due to misunderstanding. He had [employer], which was bought by [employer], so when his bills starting coming from [place] he thought they were solicitation and did not open them. Thus [employer] has cancelled his plan, and so far has refused to reinstate him. Pt is trying to get coverage back but the decision may take up to 30 days.  In the mean time, pt would like to pursue cheapest options for medications. Discussed $4 list at [facility] and [facility]. Pt reports he has the following quantities remaining of his medications:  Ciprofloxacin 500 mg - 0 - $18.99 via [facility] Vitamin B1 - 0 (OTC) Furosemide 20 mg - 30 days - $4 list Folic acid - 30 days - $4 list  Gabapentin 300 mg - 60 days - $16.86 via [facility] Potassium 20 meq - 60 days - $10.70 via [facility] Pantoprazole 40 mg - 60 days - $15.53 via [facility] Metformin 500 mg - "plenty" - $4 list Test strips - #40 - Relion Test strips $18/100 ct (would require purchase of Relion meter ~$9)  Lactulose - "plenty" Toujeo - samples through October   Plan: -Patient will transfer prescriptions to [facility] for $4 list drugs and use [facility] for remaining meds as needed -Printed [facility] coupons for non-$4 list meds and left for patient to pick up from office

## 2019-08-11 ENCOUNTER — Ambulatory Visit: Payer: Medicare Other | Admitting: Registered"

## 2019-08-24 NOTE — Progress Notes (Signed)
NEUROLOGY FOLLOW UP OFFICE NOTE  Patrick Kidd [id number]  HISTORY OF PRESENT ILLNESS: Patrick Kidd is a 74 y.o. right-handed male who follows up for diabetic polyneuropathy.  Hospital records reviewed.  UPDATE: He is taking gabapentin39m twice daily.   He was hospitalized last Christmas for acute cholecystitis and ascites.  He had a prolonged recovery.  He was quite weak and finished physical therapy back in May.  He still has some tightness in his hamstrings.  Hgb A1c from May was 6.8.  Thiamine level was 7.  He has since quit drinking and is doing much better.  He walks 100 laps in the pool daily.  Neuropathy is stable.  HISTORY: Onset of symptoms began in 2010.He describes initially experiencing numbness on the bottom of both feet, in which recently it has spread to the ankles. It is also associated with an uncomfortable tingling sensation. It is not painful but it is uncomfortable and usually bothers him at night.He doesn't really notice it if he is active.He is concerned about his balance.The first time, he was getting out of a boat and felt that his right leg was not strong enough to hold his weight and he fell to his knees. The second time, he tripped over a chair and fell down to his knees.He felt that his leg just gave out when it paired his weight. He is particularly concerned, because he does enjoy riding on a boat and dancing.More recently, he stood up from a chair at a game and felt shaky and unsteady.He has not been exercising as much due to fear of imbalance.He denies any pain radiating down the legs.He has pain in his right big toe after an 8 pound frozen duck breast fell on it.He has tingling in the finger tips.  Labs from 08/04/12 revealed ESR 29, ANA negative, Lyme negative.SPEP revealed possible faint restricted band in the gamma region, IFE revealed polyclonal gammopathy involving IgA and IgM.Prior labs last month revealed Hgb A1c  5.7, TSH 2.42, RPR non-reactive, serum glucose 131.CBC and CMP otherwise unremarkable.B12 from 5/12 was 1293.Fasting glucose was 143 and 2 hour glucose tolerance test was 341.However, he was diagnosed with type 2 diabetes after Hgb A1c from 01/09/16 was found to be 8.4.  PAST MEDICAL HISTORY: Past Medical History:  Diagnosis Date  . Acrophobia   . [place] 86/76/1950 . Anemia   . Arthritis    foot by big toe  . BPH associated with nocturia   . Cataract    removed both eyes  . Chronic cough    PMH of  . COVID-19 12/2018  . Diabetes mellitus without complication ([place]   . Diverticulosis 07-03-2010   Colonoscopy.   . Duodenal ulcer 2017  . Fallen arches    Bilateral  . GERD (gastroesophageal reflux disease)   . Gout   . Granuloma annulare   . Hx of adenomatous colonic polyps multiple  . Hydrocele 2011   Large septated right hydrocele  . Liver cyst   . Liver lesion   . Nonspecific elevation of levels of transaminase or lactic acid dehydrogenase (LDH)   . Obesity   . Peripheral neuropathy   . Plantar fasciitis    PMH of  . [place] hypertension ([place] 2017  . Prostate cancer ([place]   . Right shoulder pain 11/2017  . Sleep apnea    no cpap, patient denies  . Wears reading eyeglasses   . WPW (Wolff-Parkinson-White syndrome) 02/05/2019    MEDICATIONS: Current Outpatient Medications  on File Prior to Visit  Medication Sig Dispense Refill  . atenolol (TENORMIN) 25 MG tablet Take one tablet as needed for heart racing/palpitations.  May take up to 2 tablets in 24 hours. (Patient not taking: Reported on 08/04/2019) 60 tablet 3  . azelastine (ASTELIN) 0.1 % nasal spray Place 2 sprays into both nostrils 2 (two) times daily. Use in each nostril as directed 90 mL 1  . blood glucose meter kit and supplies KIT Use to test blood sugar once daily. DX E11.8 1 each 0  . Blood Glucose Monitoring Suppl (FREESTYLE FREEDOM LITE) w/Device KIT Use to check blood sugars 2 times  daily 1 each 1  . ciprofloxacin (CIPRO) 500 MG tablet Take 1 tablet (500 mg total) by mouth daily. 30 tablet 3  . Colchicine (MITIGARE) 0.6 MG CAPS Take 1 tablet by mouth daily. 90 capsule 0  . COVID-19 mRNA vaccine, Moderna, 100 MCG/0.5ML SUSP Moderna COVID-19 Vaccine (PF) 100 mcg/0.5 mL intramuscular susp. (EUA)  PHARMACY ADMINISTERED    . cyclobenzaprine (FLEXERIL) 5 MG tablet Take 1 to 2 tabs at bedtime if needed. (Patient not taking: Reported on 08/04/2019) 30 tablet 1  . folic acid (FOLVITE) 1 MG tablet Take 1 tablet (1 mg total) by mouth daily. 90 tablet 1  . furosemide (LASIX) 20 MG tablet Take 1 tablet (20 mg total) by mouth daily. 90 tablet 1  . gabapentin (NEURONTIN) 300 MG capsule Take 1 capsule (300 mg total) by mouth 2 (two) times daily. 180 capsule 1  . glucose blood (COOL BLOOD GLUCOSE TEST STRIPS) test strip Use to check blood sugar daily. DX: E11.8 300 each 3  . Insulin Glargine, 2 Unit Dial, (TOUJEO MAX SOLOSTAR) 300 UNIT/ML SOPN Inject 30 Units into the skin daily. 3 mL 3  . KLOR-CON 20 MEQ packet DISSOLVE CONTENTS OF 1 PACKET AND DRINK DAILY 90 each 2  . lactulose (CHRONULAC) 10 GM/15ML solution Take 15 mLs (10 g total) by mouth 2 (two) times daily. 946 mL 11  . Lancets (ACCU-CHEK MULTICLIX) lancets Use to check sugar daily. DX: E11.8 100 each 12  . metFORMIN (GLUCOPHAGE) 500 MG tablet Take 1 tablet (500 mg total) by mouth 2 (two) times daily with a meal. 180 tablet 1  . Multiple Vitamin (MULTIVITAMIN WITH MINERALS) TABS tablet Take 1 tablet by mouth daily.    . pantoprazole (PROTONIX) 40 MG tablet Take 1 tablet (40 mg total) by mouth daily. 90 tablet 1  . potassium chloride SA (KLOR-CON) 20 MEQ tablet Take 1 tablet (20 mEq total) by mouth daily. 90 tablet 1  . rifaximin (XIFAXAN) 550 MG TABS tablet Take 1 tablet (550 mg total) by mouth 2 (two) times daily. (Patient not taking: Reported on 08/04/2019) 180 tablet 3  . sildenafil (REVATIO) 20 MG tablet Take 2 tablets (40 mg total)  by mouth daily as needed. 40 tablet 4  . thiamine 100 MG tablet Take 1 tablet (100 mg total) by mouth every other day. 45 tablet 1  . [DISCONTINUED] potassium chloride (KLOR-CON) 20 MEQ packet Take 20 mEq by mouth daily. 30 packet 1   No current facility-administered medications on file prior to visit.    ALLERGIES: Allergies  Allergen Reactions  . 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  Use with caution  . Calcium Channel Blockers     Likely WPW.  Use with caution.    FAMILY HISTORY: Family History  Problem Relation Age of Onset  . Lung cancer Mother  smoker  . Leukemia Father        Acute myelocytic  . Diabetes Neg Hx   . Stroke Neg Hx   . Heart disease Neg Hx   . Colon cancer Neg Hx   . Colon polyps Neg Hx   . Esophageal cancer Neg Hx   . Rectal cancer Neg Hx   . Stomach cancer Neg Hx     SOCIAL HISTORY: Social History   Socioeconomic History  . Marital status: Married    Spouse name: [name]  . Number of children: 2  . Years of education: Not on file  . Highest education level: Some college, no degree  Occupational History  . Occupation: Adult nurse estate  Tobacco Use  . Smoking status: Former Smoker    Packs/day: 2.00    Years: 6.00    Pack years: 12.00    Types: Cigarettes    Quit date: 02/04/1971    Years since quitting: 48.5  . Smokeless tobacco: Never Used  . Tobacco comment: smoked age [age], up to 2 ppd  Vaping Use  . Vaping Use: Never used  Substance and Sexual Activity  . Alcohol use: Not Currently    Alcohol/week: 28.0 standard drinks    Types: 28 Shots of liquor per week    Comment: No alochol since Christmas Day 2020  . Drug use: No  . Sexual activity: Yes    Partners: Female  Other Topics Concern  . Not on file  Social History Narrative   Worked in Adult nurse estate   2 children   Former smoker no drug use   Alcoholism, abstinent since 01/23/2019 most recently   Fun/Hobby: Play golf, grandchildren, [employer]       Patient is left-handed. He lives with his wife in a one level home. He swims most days.   Social Determinants of Health   Financial Resource Strain:   . Difficulty of Paying Living Expenses:   Food Insecurity:   . Worried About Charity fundraiser in the Last Year:   . Arboriculturist in the Last Year:   Transportation Needs:   . Film/video editor (Medical):   Marland Kitchen Lack of Transportation (Non-Medical):   Physical Activity:   . Days of Exercise per Week:   . Minutes of Exercise per Session:   Stress:   . Feeling of Stress :   Social Connections:   . Frequency of Communication with Friends and Family:   . Frequency of Social Gatherings with Friends and Family:   . Attends Religious Services:   . Active Member of Clubs or Organizations:   . Attends Archivist Meetings:   Marland Kitchen Marital Status:   Intimate Partner Violence:   . Fear of Current or Ex-Partner:   . Emotionally Abused:   Marland Kitchen Physically Abused:   . Sexually Abused:     PHYSICAL EXAM: Blood pressure (!) 141/81, pulse 91, height 6' 2"  (1.88 m), weight (!) 227 lb (103 kg), SpO2 99 %. General: No acute distress.  Patient appears well-groomed.   Head:  Normocephalic/atraumatic Eyes:  Fundi examined but not visualized Neck: supple, no paraspinal tenderness, full range of motion Heart:  Regular rate and rhythm Lungs:  Clear to auscultation bilaterally Back: No paraspinal tenderness Neurological Exam: alert and oriented to person, place, and time. Attention span and concentration intact, recent and remote memory intact, fund of knowledge intact.  Speech fluent and not dysarthric, language intact.  CN II-XII intact. Bulk and tone normal,  muscle strength 5/5 throughout.  Sensation to pinprick and vibration reduced up to mid shin.  Deep tendon reflexes 2+ throughout, toes downgoing.  Finger to nose and heel to shin testing intact.  Gait normal, Romberg negative.  IMPRESSION: Polyneuropathy Type 2 diabetes  mellitus History of alcoholism  PLAN: Gabapentin 311m twice daily refilled Follow up in one year  AMetta Clines DO  CC: TScarlette Calico MD

## 2019-08-26 ENCOUNTER — Encounter: Payer: Self-pay | Admitting: Neurology

## 2019-08-26 ENCOUNTER — Other Ambulatory Visit: Payer: Self-pay

## 2019-08-26 ENCOUNTER — Ambulatory Visit (INDEPENDENT_AMBULATORY_CARE_PROVIDER_SITE_OTHER): Payer: Medicare Other | Admitting: Neurology

## 2019-08-26 VITALS — BP 141/81 | HR 91 | Ht 74.0 in | Wt 227.0 lb

## 2019-08-26 DIAGNOSIS — G6289 Other specified polyneuropathies: Secondary | ICD-10-CM | POA: Diagnosis not present

## 2019-08-26 DIAGNOSIS — Z794 Long term (current) use of insulin: Secondary | ICD-10-CM

## 2019-08-26 DIAGNOSIS — F1021 Alcohol dependence, in remission: Secondary | ICD-10-CM

## 2019-08-26 DIAGNOSIS — E118 Type 2 diabetes mellitus with unspecified complications: Secondary | ICD-10-CM

## 2019-08-26 MED ORDER — GABAPENTIN 300 MG PO CAPS: 300.0000 mg | ORAL_CAPSULE | Freq: Two times a day (BID) | ORAL | 3 refills | Status: DC

## 2019-08-26 NOTE — Patient Instructions (Signed)
Gabapentin refilled Follow up 1 year

## 2019-09-05 ENCOUNTER — Other Ambulatory Visit: Payer: Self-pay | Admitting: Internal Medicine

## 2019-09-06 ENCOUNTER — Telehealth: Payer: Self-pay | Admitting: Internal Medicine

## 2019-09-06 NOTE — Telephone Encounter (Signed)
Please advise Sir how many refills, thank you.

## 2019-09-06 NOTE — Telephone Encounter (Signed)
Left patient a detailed message that the cipro has been refilled and that it looked like in May he was going to call for appointment so I told him to give [place] a call back to set that up.

## 2019-09-07 ENCOUNTER — Other Ambulatory Visit: Payer: Self-pay | Admitting: Internal Medicine

## 2019-09-18 ENCOUNTER — Other Ambulatory Visit: Payer: Self-pay | Admitting: Internal Medicine

## 2019-09-20 ENCOUNTER — Other Ambulatory Visit: Payer: Self-pay | Admitting: Internal Medicine

## 2019-09-20 NOTE — Telephone Encounter (Signed)
Seen in Feb, please advise Sir?

## 2019-09-20 NOTE — Telephone Encounter (Signed)
I left a detailed voice mail for him to call for an October appointment.

## 2019-09-20 NOTE — Telephone Encounter (Signed)
Refill x3 months and asked the patient to come in October for a follow-up visit

## 2019-10-11 ENCOUNTER — Other Ambulatory Visit: Payer: Self-pay

## 2019-10-11 ENCOUNTER — Other Ambulatory Visit: Payer: Self-pay | Admitting: Internal Medicine

## 2019-10-11 ENCOUNTER — Ambulatory Visit: Payer: Medicare Other | Admitting: Pharmacist

## 2019-10-11 DIAGNOSIS — E118 Type 2 diabetes mellitus with unspecified complications: Secondary | ICD-10-CM

## 2019-10-11 DIAGNOSIS — K7031 Alcoholic cirrhosis of liver with ascites: Secondary | ICD-10-CM

## 2019-10-11 DIAGNOSIS — I10 Essential (primary) hypertension: Secondary | ICD-10-CM

## 2019-10-11 DIAGNOSIS — E785 Hyperlipidemia, unspecified: Secondary | ICD-10-CM

## 2019-10-11 NOTE — Chronic Care Management (AMB) (Signed)
Chronic Care Management Pharmacy  Name: Patrick Kidd  MRN: [id number] DOB: [birth year]  Chief Complaint/ HPI  Patrick Kidd,  74 y.o. , male presents for their Follow-Up CCM visit with the clinical pharmacist In office.  PCP : [name], MD  Their chronic conditions include: Hypertension, Hyperlipidemia, Diabetes, GERD, Overactive Bladder and BPH, alcoholic cirrhosis, DJD, prostate cancer, allergies, Peripheral vascular disease  Born in [place], married wife [name] in 1979, married 34 years, 2 boys and 3 grandchildren. Lives in [facility], goes to swimming pool often.  Office Visits: 06/09/19 Dr Ronnald Ramp OV: gout flare resolved on its own. Uric acid elevated, started colchicine daily. Abstaining from alcohol, encephalopathy improved. MELD = 8. Rx'd Astelin for allergies, sildenafil 20-40 mg for ED. 04/22/19 crestor changed to lipitor for cost savings.  Consult Visit: 08/26/19 Dr Tomi Likens (neurology): diabetic neuropathy, gabapentin refilled. 07/19/19 Dr Lovena Le (cardiology): stopped lisinopril due to good BP control, stopped statin due to cirrhosis. 05/24/19 Home care visit: no changes 04/14/19 Dietician visit for DM. 04/13/19 DM eye exam. 01/28/19 - hospitalization for jaundice for 5 wks.  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  Use with caution.    Medications: Outpatient Encounter Medications as of 10/11/2019  Medication Sig   azelastine (ASTELIN) 0.1 % nasal spray Place 2 sprays into both nostrils 2 (two) times daily. Use in each nostril as directed   blood glucose meter kit and supplies KIT Use to test blood sugar once daily. DX E11.8   Blood Glucose Monitoring Suppl (FREESTYLE FREEDOM LITE) w/Device KIT Use to check blood sugars 2 times daily   ciprofloxacin (CIPRO) 500 MG tablet TAKE 1 TABLET BY MOUTH EVERY DAY   Colchicine (MITIGARE) 0.6 MG CAPS Take 1 tablet by  mouth daily.   cyclobenzaprine (FLEXERIL) 5 MG tablet Take 1 to 2 tabs at bedtime if needed.   folic acid (FOLVITE) 1 MG tablet TAKE 1 TABLET EVERY DAY   furosemide (LASIX) 20 MG tablet TAKE 1 TABLET(20 MG) BY MOUTH DAILY   gabapentin (NEURONTIN) 300 MG capsule Take 1 capsule (300 mg total) by mouth 2 (two) times daily.   glucose blood (COOL BLOOD GLUCOSE TEST STRIPS) test strip Use to check blood sugar daily. DX: E11.8   Insulin Glargine, 2 Unit Dial, (TOUJEO MAX SOLOSTAR) 300 UNIT/ML SOPN Inject 30 Units into the skin daily.   KLOR-CON 20 MEQ packet DISSOLVE CONTENTS OF 1 PACKET AND DRINK DAILY   lactulose (CHRONULAC) 10 GM/15ML solution TAKE 15 MLS (10 G TOTAL) BY MOUTH 2 (TWO) TIMES DAILY.   Lancets (ACCU-CHEK MULTICLIX) lancets Use to check sugar daily. DX: E11.8   metFORMIN (GLUCOPHAGE) 500 MG tablet TAKE 1 TABLET BY MOUTH TWICE A DAY WITH MEALS   Multiple Vitamin (MULTIVITAMIN WITH MINERALS) TABS tablet Take 1 tablet by mouth daily.   pantoprazole (PROTONIX) 40 MG tablet Take 1 tablet (40 mg total) by mouth daily.   potassium chloride SA (KLOR-CON) 20 MEQ tablet Take 1 tablet (20 mEq total) by mouth daily.   rifaximin (XIFAXAN) 550 MG TABS tablet Take 1 tablet (550 mg total) by mouth 2 (two) times daily.   sildenafil (REVATIO) 20 MG tablet Take 2 tablets (40 mg total) by mouth daily as needed.   thiamine 100 MG tablet Take 1 tablet (100 mg total) by mouth every other day.   atenolol (TENORMIN) 25 MG  tablet Take one tablet as needed for heart racing/palpitations.  May take up to 2 tablets in 24 hours. (Patient not taking: Reported on 10/11/2019)   lisinopril (ZESTRIL) 10 MG tablet TAKE 1 TABLET EVERY DAY (Patient not taking: Reported on 10/11/2019)   [DISCONTINUED] COVID-19 mRNA vaccine, Moderna, 100 MCG/0.5ML SUSP Moderna COVID-19 Vaccine (PF) 100 mcg/0.5 mL intramuscular susp. (EUA)  PHARMACY ADMINISTERED   [DISCONTINUED] folic acid (FOLVITE) 1 MG tablet Take 1 tablet (1  mg total) by mouth daily.   [DISCONTINUED] furosemide (LASIX) 20 MG tablet Take 1 tablet (20 mg total) by mouth daily.   [DISCONTINUED] metFORMIN (GLUCOPHAGE) 500 MG tablet Take 1 tablet (500 mg total) by mouth 2 (two) times daily with a meal.   [DISCONTINUED] potassium chloride (KLOR-CON) 20 MEQ packet Take 20 mEq by mouth daily.   No facility-administered encounter medications on file as of 10/11/2019.     Current Diagnosis/Assessment:    Goals Addressed            This Visit's Progress    Pharmacy Care Plan       CARE PLAN ENTRY  Current Barriers:   Chronic Disease Management support, education, and care coordination needs related to Hypertension, Hyperlipidemia, Diabetes, and Cirrhosis   Hypertension BP Readings from Last 3 Encounters:  06/09/19 124/60  05/24/19 122/62  03/31/19 100/65   Pharmacist Clinical Goal(s): o Over the next 90 days, patient will work with PharmD and providers to maintain BP goal <130/80  Current regimen:  o No medications  Interventions: o Discussed BP goals and benefits of medication  Patient self care activities - Over the next 90 days, patient will: o Check BP as needed, document, and provide at future appointments o Ensure daily salt intake < 2300 mg/day  Hyperlipidemia Lab Results  Component Value Date/Time   LDLCALC 81 03/14/2019 10:36 AM   Pharmacist Clinical Goal(s): o Over the next 90 days, patient will work with PharmD and providers to maintain LDL goal < 100  Current regimen:  o No medications  Interventions: o Discussed risks of statin in cirrhosis  Patient self care activities - Over the next 90 days, patient will: o Continue medication as prescribed o Continue low cholesterol diet and exercise routine  Diabetes Lab Results  Component Value Date/Time   HGBA1C 6.8 (H) 06/09/2019 02:49 PM   HGBA1C 8.9 (H) 01/31/2019 06:00 AM   Pharmacist Clinical Goal(s): o Over the next 90 days, patient will work with  PharmD and providers to maintain A1c goal <7%  Current regimen:  o Toujeo Max 30 units daily o Metformin 500 mg BID  Interventions: o Discussed A1c goal and benefits of medications o Discussed progression of diabetes and low likelihood of control without insulin  Patient self care activities - Over the next 90 days, patient will: o Check blood sugar once daily and in the morning before eating or drinking, document, and provide at future appointments o Contact provider with any episodes of hypoglycemia  Cirrhosis  Pharmacist Clinical Goal(s) o Over the next 90 days, patient will work with PharmD and providers to optimize therapy  Current regimen:  o Lactulose 10 gm/60mL twice a day o Furosemide 20 mg daily o Potassium chloride 20 mEq daily o Ciprofloxacin 500 mg daily  Interventions: o Discussed benefit of lactulose for removal of ammonium and treatment of hepatic encephalopathy o Discussed benefit of furosemide for fluid removal and prevention of ascites - Furosemide causes potassium loss in the urine, so potassium supplement is needed with  furosemide o Discussed role of ciprofloxacin in prevention of SBP (spontaneous bacterial peritonitis) in the ascites fluid o Recommended to follow up with gastroenterologist regarding length of therapy and liver prognosis  Patient self care activities - Over the next 90 days, patient will: o Continue medications as prescribed o Follow up with Dr. [name] regarding liver medications o Continue to abstain from alcohol  Medication management  Pharmacist Clinical Goal(s): o Over the next 90 days, patient will work with PharmD and providers to maintain optimal medication adherence  [facility]  Interventions o Comprehensive medication review performed. o Continue current medication management strategy  Patient self care activities - Over the next 90 days, patient will: o Focus on medication adherence by fill date o Take  medications as prescribed o Report any questions or concerns to PharmD and/or provider(s)  Please see past updates related to this goal by clicking on the "Past Updates" button in the selected goal        Hypertension   BP goal is:  <130/80  Office blood pressures are  BP Readings from Last 3 Encounters:  08/26/19 (!) 141/81  07/19/19 (!) 112/58  06/09/19 124/60   Kidney Function Lab Results  Component Value Date/Time   CREATININE 1.10 06/09/2019 02:49 PM   CREATININE 0.85 03/14/2019 10:36 AM   CREATININE 1.13 06/04/2010 01:54 PM   GFR 65.32 06/09/2019 02:49 PM   GFRNONAA >60 02/06/2019 05:46 AM   GFRAA >60 02/06/2019 05:46 AM   Patient checks BP at home infrequently Patient home BP readings are ranging: SBP 115-120 typically  Patient has failed these meds in the past: n/a Patient is currently controlled on the following medications:   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) - not taking  We discussed diet and exercise extensively; pt reports he has not had to take a dose of atenolol for palpitations; lisinopril was recently discontinued by cardiologist due to controlled BP  Plan  Continue current medications     Hyperlipidemia/PVD   LDL goal < 100  Lipid Panel     Component Value Date/Time   CHOL 153 03/14/2019 1036   TRIG 134.0 03/14/2019 1036   HDL 45.00 03/14/2019 1036   [place] 81 03/14/2019 1036     The 10-year ASCVD risk score Mikey Bussing DC Jr., et al., 2013) is: 48.9%   Values used to calculate the score:     Age: 31 years     Sex: Male     Is Non-Hispanic African American: No     Diabetic: Yes     Tobacco smoker: No     Systolic Blood Pressure: 629 mmHg     Is BP treated: Yes     HDL Cholesterol: 45 mg/dL     Total Cholesterol: 153 mg/dL   Patient has failed these meds in past: rosuvastatin, atorvatatin Patient is currently controlled on the following medications:   No medications  We discussed:  diet and exercise extensively; statin was recently  discontinued by cardiologist due to cirrhosis.  Plan  Continue current medications  Diabetes   A1c goal < 7%  Recent Relevant Labs: Lab Results  Component Value Date/Time   HGBA1C 6.8 (H) 06/09/2019 02:49 PM   HGBA1C 8.9 (H) 01/31/2019 06:00 AM   GFR 65.32 06/09/2019 02:49 PM   GFR 88.02 03/14/2019 10:36 AM   MICROALBUR 3.4 (H) 03/14/2019 10:36 AM   MICROALBUR 1.3 10/10/2016 08:29 AM    Last diabetic Eye exam:  Lab Results  Component Value Date/Time  HMDIABEYEEXA No Retinopathy 04/13/2019 12:00 AM    Last diabetic Foot exam: No results found for: HMDIABFOOTEX   Checking BG: Daily  Recent FBG Readings: 108  Patient has failed these meds in past: n/a Patient is currently controlled on the following medications:  Toujeo Max 30 units daily   Metformin 500 mg BID  We discussed: diet and exercise extensively and how to recognize and treat signs of hypoglycemia; pt is interested in reducing or stopping insulin; discussed role of insulin in diabetes, blood sugar goals, and benefits of checking BG at different times of day. Pt also given samples of Toujeo - 2 boxes.  Plan  Continue current medications  GERD   Patient has failed these meds in past: n/a Patient is currently controlled on the following medications:   Pantoprazole 40 mg daily  We discussed:  Pt reports he has significant GERD and cannot go without PPI.   Plan  Continue current medications  Neuropathy/DJD   Patient has failed these meds in past: cyclobenzaprine Patient is currently controlled on the following medications:   Gabapentin 300 mg BID  We discussed:  Pain is controlled with current dose of gabapentin  Plan  Continue current medications  Cirrhosis   Patient has failed these meds in past: Xifaxin (cost) Patient is currently controlled on the following medications:   Lactulose 10g/60m BID  Ciprofloxacin 500 mg daily (SBP ppx?)  Furosemide 20 mg daily  Potassium chloride 20  mEq daily  We discussed: role of each medication in cirrhosis/hepatic encephalopathy/SBP prophylaxis. Recommended to discuss length of therapy with GI provider. Discussed importance of continued abstinence from alcohol.  Plan  Continue current medications  Medication Management   [facility] Uses pill box? No - keeps meds on counter Pt endorses 100% compliance  We discussed[facility] and pt is satisfied with their service.   Plan  Continue current medication management strategy    Follow up: 3 month phone visit  LCharlene Brooke PharmD Clinical Pharmacist [phone]

## 2019-10-11 NOTE — Patient Instructions (Addendum)
Visit Information  Phone number for Pharmacist: [phone]  Goals Addressed            This Visit's Progress   . Pharmacy Care Plan       CARE PLAN ENTRY  Current Barriers:  . Chronic Disease Management support, education, and care coordination needs related to Hypertension, Hyperlipidemia, Diabetes, and Cirrhosis   Hypertension BP Readings from Last 3 Encounters:  06/09/19 124/60  05/24/19 122/62  03/31/19 100/65 .  Pharmacist Clinical Goal(s): o Over the next 90 days, patient will work with PharmD and providers to maintain BP goal <130/80 . Current regimen:  o No medications . Interventions: o Discussed BP goals and benefits of medication . Patient self care activities - Over the next 90 days, patient will: o Check BP as needed, document, and provide at future appointments o Ensure daily salt intake < 2300 mg/day  Hyperlipidemia Lab Results  Component Value Date/Time   LDLCALC 81 03/14/2019 10:36 AM .  Pharmacist Clinical Goal(s): o Over the next 90 days, patient will work with PharmD and providers to maintain LDL goal < 100 . Current regimen:  o No medications . Interventions: o Discussed risks of statin in cirrhosis . Patient self care activities - Over the next 90 days, patient will: o Continue medication as prescribed o Continue low cholesterol diet and exercise routine  Diabetes Lab Results  Component Value Date/Time   HGBA1C 6.8 (H) 06/09/2019 02:49 PM   HGBA1C 8.9 (H) 01/31/2019 06:00 AM .  Pharmacist Clinical Goal(s): o Over the next 90 days, patient will work with PharmD and providers to maintain A1c goal <7% . Current regimen:  o Toujeo Max 30 units daily o Metformin 500 mg BID . Interventions: o Discussed A1c goal and benefits of medications o Discussed progression of diabetes and low likelihood of control without insulin . Patient self care activities - Over the next 90 days, patient will: o Check blood sugar once daily and in the morning  before eating or drinking, document, and provide at future appointments o Contact provider with any episodes of hypoglycemia  Cirrhosis . Pharmacist Clinical Goal(s) o Over the next 90 days, patient will work with PharmD and providers to optimize therapy . Current regimen:  o Lactulose 10 gm/28mL twice a day o Furosemide 20 mg daily o Potassium chloride 20 mEq daily o Ciprofloxacin 500 mg daily . Interventions: o Discussed benefit of lactulose for removal of ammonium and treatment of hepatic encephalopathy o Discussed benefit of furosemide for fluid removal and prevention of ascites - Furosemide causes potassium loss in the urine, so potassium supplement is needed with furosemide o Discussed role of ciprofloxacin in prevention of SBP (spontaneous bacterial peritonitis) in the ascites fluid o Recommended to follow up with gastroenterologist regarding length of therapy and liver prognosis . Patient self care activities - Over the next 90 days, patient will: o Continue medications as prescribed o Follow up with Dr. [name] regarding liver medications o Continue to abstain from alcohol  Medication management . Pharmacist Clinical Goal(s): o Over the next 90 days, patient will work with PharmD and providers to maintain optimal medication adherence . [facility] . Interventions o Comprehensive medication review performed. o Continue current medication management strategy . Patient self care activities - Over the next 90 days, patient will: o Focus on medication adherence by fill date o Take medications as prescribed o Report any questions or concerns to PharmD and/or provider(s)  Please see past updates related to this goal  by clicking on the "Past Updates" button in the selected goal        Patient verbalizes understanding of instructions provided today.  Telephone follow up appointment with pharmacy team member scheduled for: 3 months  Charlene Brooke, PharmD,  BCACP Clinical Pharmacist [facility] [phone]  Alcoholic Liver Disease Alcoholic liver disease refers to liver damage that is caused by drinking a lot of alcohol over a long period of time. The liver is an organ that:  Helps to divide (break down) and absorb fats and other nutrients in the blood.  Helps to remove harmful substances from the blood.  Makes the parts of the blood that help to form clots and prevent excessive bleeding. Damage may cause the liver to stop working properly. There are three main types of alcoholic liver disease, and they usually occur in the following order. 1. Fatty liver disease. This is considered the early stage of alcoholic liver disease. It is a condition in which too much fat has built up in the liver cells. In many cases, fatty liver disease does not cause any symptoms. It is often diagnosed when lab tests are done for other reasons. 2. Alcoholic hepatitis. This is liver inflammation that decreases the liver's ability to function normally. 3. Alcoholic cirrhosis. Cirrhosis is long-term (chronic) liver injury. Having cirrhosis means that many healthy liver cells have been replaced by scar tissue. This prevents blood from flowing through the liver, which makes it difficult for the liver to function. Symptoms of this condition usually get worse (progress) over time if alcohol use continues. Treatment requires stopping all alcohol intake. What are the causes? Alcoholic liver disease is caused by long-term (chronic) heavy alcohol use. The liver filters alcohol out of the bloodstream. When alcohol gets broken down in the liver, it releases poisonous (toxic) chemicals that damage liver cells. What increases the risk? This condition is more likely to develop in:  Women.  People who have a family history of alcoholic liver disease.  People who have poor nutrition.  People who are obese.  People who drink a lot of alcohol,  especially people who often drink a lot during short periods of time (binge drink).  People who have an underlying liver disease such as hepatitis B or hepatitis C.  People who lack certain nutrients, such as folate or thiamine (have nutrient deficiencies). What are the signs or symptoms? Most people do not have symptoms in the early stages of this disease. If early symptoms do develop, they may include:  Loss of appetite.  Nausea and vomiting. Symptoms of moderate disease include:  Loss of appetite.  Nausea and vomiting.  Diarrhea.  Weight loss without trying.  Fatigue.  Yellowing of the skin and the whites of the eyes (jaundice).  Pain and swelling in the abdomen.  Fever. Symptoms of advanced disease include:  Weight loss and muscle loss.  Itchy skin.  Buildup of fluid in the abdomen (ascites).  Swelling of the feet and ankles (edema).  Nosebleeds or bleeding gums.  Bloody bowel movements.  Enlarged fingertips (clubbing).  Trouble concentrating.  Trouble sleeping.  Mood changes.  Agitation.  Confusion. Some people do not have symptoms until the condition becomes severe. Symptoms often get worse right after a period of heavy drinking. How is this diagnosed? This condition may be diagnosed based on:  A physical exam.  Blood tests.  Tests that create detailed images of the body. These may include: ? Liver ultrasound. ? CT scan. ?  MRI.  A liver biopsy. For this test, a small sample of liver tissue is removed and checked for signs of damage. How is this treated? The most important part of treatment is to stop drinking alcohol. If you are addicted to alcohol, your health care provider will help you make a plan to quit. This plan may involve:  Taking medicine to decrease unpleasant symptoms that are caused by stopping or decreasing alcohol use (withdrawal symptoms).  Entering a treatment program to help you stop drinking.  Joining a support  group. Treatment for alcoholic liver disease may also include:  Nutritional therapy. Your health care provider or a diet and nutrition specialist (dietitian) may recommend: ? Eating a healthy diet. ? Taking vitamins. ? Eating foods that contain a lot of zinc or B vitamins such as folate, thiamine, or pyridoxine.  Steroid medicines to reduce liver inflammation. These may be recommended if your disease is severe.  Receiving a donated liver (liver transplant). This is only done in very severe cases, and only for people who have completely stopped drinking and can commit to never drinking alcohol again. Follow these instructions at home:   Do not drink alcohol. Follow your treatment plan, and work with your health care provider as needed.  Consider joining an alcohol support group. These groups can provide emotional support and guidance.  Take over-the-counter and prescription medicines only as told by your health care provider. These include vitamins and supplements.  Do not use medicines or eat foods that contain alcohol unless told by your health care provider.  Follow instructions from your health care provider or dietitian about eating a healthy diet.  Keep all follow-up visits as told by your health care provider. This is important. Contact a health care provider if:  You develop a fever.  Your skin color becomes more yellow, pale, or dark.  You develop headaches. Get help right away if:  You vomit blood.  You have bright red blood in your stool.  You have black, tarry stools.  You have trouble: ? Thinking. ? Walking. ? Balancing. ? Breathing. Summary  Alcoholic liver disease refers to liver damage that is caused by drinking a lot of alcohol over a long period of time.  Symptoms of this condition usually get worse (progress) over time if alcohol use continues.  Your health care provider will do a physical exam and tests to diagnose your condition.  The most  important part of treatment is to stop drinking alcohol. Follow your treatment plan, and work with your health care provider as needed. This information is not intended to replace advice given to you by your health care provider. Make sure you discuss any questions you have with your health care provider. Document Revised: 05/11/2018 Document Reviewed: 10/02/2016 Elsevier Patient Education  [place].

## 2019-10-19 ENCOUNTER — Other Ambulatory Visit: Payer: Self-pay | Admitting: Internal Medicine

## 2019-10-19 ENCOUNTER — Telehealth: Payer: Self-pay | Admitting: Internal Medicine

## 2019-10-19 DIAGNOSIS — I10 Essential (primary) hypertension: Secondary | ICD-10-CM

## 2019-10-19 MED ORDER — POTASSIUM CHLORIDE CRYS ER 20 MEQ PO TBCR: 20.0000 meq | EXTENDED_RELEASE_TABLET | Freq: Every day | ORAL | 1 refills | Status: DC

## 2019-10-19 NOTE — Telephone Encounter (Signed)
   1.Medication Requested: potassium chloride SA (KLOR-CON) 20 MEQ tablet  2. Pharmacy (Name, Street, City):[id number] [place] [place] - [address]  3. On Med List: yes  4. Last Visit with PCP: 06/09/19  5. Next visit date with PCP: n/a   Agent: Please be advised that RX refills may take up to 3 business days. We ask that you follow-up with your pharmacy.

## 2019-11-11 ENCOUNTER — Telehealth: Payer: Self-pay | Admitting: Internal Medicine

## 2019-11-11 NOTE — Telephone Encounter (Signed)
Pt is requesting a call back from a nurse to ask if she thinks it is safe for him to get the moderna vaccine since it hasn't been FDA approved yet.

## 2019-11-11 NOTE — Telephone Encounter (Signed)
Left patient a vm letting him know that he should contact his PCP regarding recommendation for COVID vaccine. Advised that he can give [place] a call back if he has any other questions.

## 2019-11-11 NOTE — Telephone Encounter (Signed)
    Patient seeking advice on getting Moderna booster. He wants Dr Ronnald Ramp opinion if booster is needed

## 2019-11-11 NOTE — Telephone Encounter (Signed)
Called pt, LVM with details below  

## 2019-11-16 ENCOUNTER — Other Ambulatory Visit (INDEPENDENT_AMBULATORY_CARE_PROVIDER_SITE_OTHER): Payer: Medicare Other

## 2019-11-16 ENCOUNTER — Ambulatory Visit (INDEPENDENT_AMBULATORY_CARE_PROVIDER_SITE_OTHER): Payer: Medicare Other | Admitting: Internal Medicine

## 2019-11-16 ENCOUNTER — Encounter: Payer: Self-pay | Admitting: Internal Medicine

## 2019-11-16 ENCOUNTER — Other Ambulatory Visit: Payer: Medicare Other

## 2019-11-16 VITALS — BP 120/70 | HR 66 | Ht 74.0 in | Wt 237.0 lb

## 2019-11-16 DIAGNOSIS — K652 Spontaneous bacterial peritonitis: Secondary | ICD-10-CM

## 2019-11-16 DIAGNOSIS — Z794 Long term (current) use of insulin: Secondary | ICD-10-CM

## 2019-11-16 DIAGNOSIS — K7290 Hepatic failure, unspecified without coma: Secondary | ICD-10-CM

## 2019-11-16 DIAGNOSIS — K7031 Alcoholic cirrhosis of liver with ascites: Secondary | ICD-10-CM

## 2019-11-16 DIAGNOSIS — E118 Type 2 diabetes mellitus with unspecified complications: Secondary | ICD-10-CM

## 2019-11-16 DIAGNOSIS — K7682 Hepatic encephalopathy: Secondary | ICD-10-CM

## 2019-11-16 DIAGNOSIS — E519 Thiamine deficiency, unspecified: Secondary | ICD-10-CM | POA: Insufficient documentation

## 2019-11-16 LAB — COMPREHENSIVE METABOLIC PANEL
ALT: 12 U/L (ref 0–53)
AST: 16 U/L (ref 0–37)
Albumin: 4.2 g/dL (ref 3.5–5.2)
Alkaline Phosphatase: 76 U/L (ref 39–117)
BUN: 17 mg/dL (ref 6–23)
CO2: 27 mEq/L (ref 19–32)
Calcium: 9.5 mg/dL (ref 8.4–10.5)
Chloride: 102 mEq/L (ref 96–112)
Creatinine, Ser: 1.12 mg/dL (ref 0.40–1.50)
GFR: 63.88 mL/min (ref >60.00)
Glucose, Bld: 199 mg/dL — ABNORMAL HIGH (ref 70–99)
Potassium: 4.3 mEq/L (ref 3.5–5.1)
Sodium: 137 mEq/L (ref 135–145)
Total Bilirubin: 0.9 mg/dL (ref 0.2–1.2)
Total Protein: 7 g/dL (ref 6.0–8.3)

## 2019-11-16 LAB — PROTIME-INR
INR: 1 ratio (ref 0.8–1.0)
Prothrombin Time: 11.7 s (ref 9.6–13.1)

## 2019-11-16 LAB — CBC WITH DIFFERENTIAL/PLATELET
Basophils Absolute: 0 10*3/uL (ref 0.0–0.1)
Basophils Relative: 0.3 % (ref 0.0–3.0)
Eosinophils Absolute: 0.1 10*3/uL (ref 0.0–0.7)
Eosinophils Relative: 1.8 % (ref 0.0–5.0)
HCT: 41.4 % (ref 39.0–52.0)
Hemoglobin: 14.4 g/dL (ref 13.0–17.0)
Lymphocytes Relative: 50.7 % — ABNORMAL HIGH (ref 12.0–46.0)
Lymphs Abs: 3.7 10*3/uL (ref 0.7–4.0)
MCHC: 34.8 g/dL (ref 30.0–36.0)
MCV: 89.5 fl (ref 78.0–100.0)
Monocytes Absolute: 0.5 10*3/uL (ref 0.1–1.0)
Monocytes Relative: 6.4 % (ref 3.0–12.0)
Neutro Abs: 3 10*3/uL (ref 1.4–7.7)
Neutrophils Relative %: 40.8 % — ABNORMAL LOW (ref 43.0–77.0)
Platelets: 89 10*3/uL — ABNORMAL LOW (ref 150.0–400.0)
RBC: 4.62 Mil/uL (ref 4.22–5.81)
RDW: 13.4 % (ref 11.5–15.5)
WBC: 7.2 10*3/uL (ref 4.0–10.5)

## 2019-11-16 LAB — HEMOGLOBIN A1C: Hgb A1c MFr Bld: 8.4 % — ABNORMAL HIGH (ref 4.6–6.5)

## 2019-11-16 MED ORDER — SULFAMETHOXAZOLE-TRIMETHOPRIM 800-160 MG PO TABS
1.0000 | ORAL_TABLET | Freq: Every day | ORAL | 1 refills | Status: DC
Start: 2019-11-16 — End: 2019-11-23

## 2019-11-16 NOTE — Progress Notes (Signed)
Patrick Kidd 74 y.o. 1946/01/13 [id number]  Assessment & Plan:  Alcoholic cirrhosis ([place]) Labs today suggest he remains very child to be at 7 or 8 points Continue current treatment he needs cross-sectional imaging again in December 2021 Follow-up me in about 6 months  SBP (spontaneous bacterial peritonitis) (HCC)-December 2020, treated on suppressive therapy Given his leg and tendon complaints I am changing him from Cipro to Septra will call about this Beta-blockers would be contraindicated due to this.  Fortunately no varices at this time.  Another contraindication is Wolff-Parkinson-White  Encephalopathy, hepatic (HCC) Clinically adequately treated on Xifaxan and low-dose lactulose will continue.    Lab Results  Component Value Date   WBC 7.2 11/16/2019   HGB 14.4 11/16/2019   HCT 41.4 11/16/2019   MCV 89.5 11/16/2019   PLT 89.0 (L) 11/16/2019    Lab Results  Component Value Date   ALT 12 11/16/2019   AST 16 11/16/2019   ALKPHOS 76 11/16/2019   BILITOT 0.9 11/16/2019   Lab Results  Component Value Date   CREATININE 1.12 11/16/2019   BUN 17 11/16/2019   NA 137 11/16/2019   K 4.3 11/16/2019   CL 102 11/16/2019   CO2 27 11/16/2019   Lab Results  Component Value Date   INR 1.0 11/16/2019   INR 1.1 (H) 06/09/2019   INR 1.1 (H) 03/14/2019     Subjective:   Chief Complaint: Follow-up of alcoholic cirrhosis  HPI Follow-up cirrhosis with no complaints.  He is gaining some weight but thinks that is because of the weight he is eating he has been eating more ice cream.  No peripheral edema.  He is trying to "get moving better" and is struggling with "tendons in his legs".  [facility].  Has not been confused or excessively sleepy.  Remains abstinent from alcohol.  Wt Readings from Last 3 Encounters:  11/16/19 237 lb (107.5 kg)  08/26/19 (!) 227 lb (103 kg)  07/19/19 244 lb (110.7 kg)    Allergies  Allergen  Reactions  . 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  Use with caution  . Calcium Channel Blockers     Likely WPW.  Use with caution.   Current Meds  Medication Sig  . blood glucose meter kit and supplies KIT Use to test blood sugar once daily. DX D17.6  . folic acid (FOLVITE) 1 MG tablet TAKE 1 TABLET EVERY DAY  . gabapentin (NEURONTIN) 300 MG capsule Take 1 capsule (300 mg total) by mouth 2 (two) times daily.  Marland Kitchen glucose blood (COOL BLOOD GLUCOSE TEST STRIPS) test strip Use to check blood sugar daily. DX: E11.8  . Insulin Glargine, 2 Unit Dial, (TOUJEO MAX SOLOSTAR) 300 UNIT/ML SOPN Inject 30 Units into the skin daily.  Marland Kitchen lactulose (CHRONULAC) 10 GM/15ML solution TAKE 15 MLS (10 G TOTAL) BY MOUTH 2 (TWO) TIMES DAILY.  Marland Kitchen Lancets (ACCU-CHEK MULTICLIX) lancets Use to check sugar daily. DX: E11.8  . metFORMIN (GLUCOPHAGE) 500 MG tablet TAKE 1 TABLET BY MOUTH TWICE A DAY WITH MEALS  . Multiple Vitamin (MULTIVITAMIN WITH MINERALS) TABS tablet Take 1 tablet by mouth daily.  . pantoprazole (PROTONIX) 40 MG tablet Take 1 tablet (40 mg total) by mouth daily.  . potassium chloride SA (KLOR-CON) 20 MEQ tablet Take 1 tablet (20 mEq total) by mouth daily.  . rifaximin (XIFAXAN) 550 MG TABS tablet Take 1 tablet (550 mg total) by mouth 2 (  two) times daily.  . sildenafil (REVATIO) 20 MG tablet Take 2 tablets (40 mg total) by mouth daily as needed.  . thiamine 100 MG tablet Take 1 tablet (100 mg total) by mouth every other day.   Past Medical History:  Diagnosis Date  . Acrophobia   . Alcoholic cirrhosis ([place]) 1/61/0960  . Anemia   . Arthritis    foot by big toe  . BPH associated with nocturia   . Cataract    removed both eyes  . Chronic cough    PMH of  . COVID-19 12/2018  . Diabetes mellitus without complication ([place])   . Diverticulosis 07-03-2010   Colonoscopy.   . Duodenal ulcer 2017  . Fallen arches    Bilateral  . GERD (gastroesophageal reflux disease)   . Gout   . Granuloma annulare    . Hx of adenomatous colonic polyps multiple  . Hydrocele 2011   Large septated right hydrocele  . Liver cyst   . Liver lesion   . Nonspecific elevation of levels of transaminase or lactic acid dehydrogenase (LDH)   . Obesity   . Peripheral neuropathy   . Plantar fasciitis    PMH of  . Portal hypertension ([place]) 2017  . Prostate cancer ([place])   . Right shoulder pain 11/2017  . Sleep apnea    no cpap, patient denies  . Wears reading eyeglasses   . WPW (Wolff-Parkinson-White syndrome) 02/05/2019   Past Surgical History:  Procedure Laterality Date  . CATARACT EXTRACTION, BILATERAL  12/2011    Dr Gershon Crane  . COLONOSCOPY  2017  . COLONOSCOPY W/ POLYPECTOMY  07/03/2010   2 adenomas, diverticulosis on right. Dr [name]  . CYSTOSCOPY N/A 03/15/2018   Procedure: CYSTOSCOPY FLEXIBLE;  Surgeon: [name], MD;  [facility];  Service: Urology;  Laterality: N/A;  NO SEEDS FOUND IN BLADDER  . ESOPHAGOGASTRODUODENOSCOPY (EGD) WITH PROPOFOL N/A 01/08/2016   Procedure: ESOPHAGOGASTRODUODENOSCOPY (EGD) WITH PROPOFOL;  Surgeon: [name], MD;  Location: [facility];  Service: Endoscopy;  Laterality: N/A;  . FLEXIBLE SIGMOIDOSCOPY  2000  . RADIOACTIVE SEED IMPLANT N/A 03/15/2018   Procedure: RADIOACTIVE SEED IMPLANT/BRACHYTHERAPY IMPLANT;  Surgeon: [name], MD;  Location: [facility];  Service: Urology;  Laterality: N/A;   72 SEEDS IMPLANTED  . SIGMOIDOSCOPY    . SPACE OAR INSTILLATION N/A 03/15/2018   Procedure: SPACE OAR INSTILLATION;  Surgeon: [name], MD;  Location: [facility];  Service: Urology;  Laterality: N/A;  . WISDOM TOOTH EXTRACTION     Social History   Social History Narrative   Worked in Adult nurse estate   2 children   Former smoker no drug use   Alcoholism, abstinent since 01/23/2019 most recently   Fun/Hobby: Play golf, grandchildren, [employer]      Patient is left-handed. He lives with his  wife in a one level home. He swims most days.   family history includes Leukemia in his father; Lung cancer in his mother.   Review of Systems As per HPI  Objective:   Physical Exam BP 120/70   Pulse 66   Ht _0  (1.88 m)   Wt 237 lb (107.5 kg)   SpO2 95%   BMI 30.43 kg/m  Well-developed well-nourished overweight-obese white man no acute distress he has increased abdominal girth Eyes anicteric Lungs are clear Heart sounds are normal The abdomen is obese soft nontender there is no clear ascites though I  cannot tell for sure The extremities are without edema He is alert and oriented x3

## 2019-11-16 NOTE — Assessment & Plan Note (Addendum)
Given his leg and tendon complaints I am changing him from Cipro to Septra will call about this Beta-blockers would be contraindicated due to this.  Fortunately no varices at this time.  Another contraindication is Wolff-Parkinson-White

## 2019-11-16 NOTE — Assessment & Plan Note (Signed)
Clinically adequately treated on Xifaxan and low-dose lactulose will continue.

## 2019-11-16 NOTE — Assessment & Plan Note (Addendum)
Labs today suggest he remains Child's B at 7 points Continue current treatment he needs cross-sectional imaging again in December 2021 Follow-up me in about 6 months

## 2019-11-16 NOTE — Patient Instructions (Signed)
Your provider has requested that you go to the basement level for lab work before leaving today. Press "B" on the elevator. The lab is located at the first door on the left as you exit the elevator.  Due to recent changes in healthcare laws, you may see the results of your imaging and laboratory studies on MyChart before your provider has had a chance to review them.  We understand that in some cases there may be results that are confusing or concerning to you. Not all laboratory results come back in the same time frame and the provider may be waiting for multiple results in order to interpret others.  Please give [place] 48 hours in order for your provider to thoroughly review all the results before contacting the office for clarification of your results.   Normal BMI (Body Mass Index- based on height and weight) is between 23 and 30. Your BMI today is Body mass index is 30.43 kg/m. Marland Kitchen Please consider follow up  regarding your BMI with your Primary Care Provider.  I appreciate the opportunity to care for you. Silvano Rusk, MD, [facility]

## 2019-11-20 DIAGNOSIS — R000 Tachycardia, unspecified: Secondary | ICD-10-CM | POA: Diagnosis not present

## 2019-11-20 DIAGNOSIS — R52 Pain, unspecified: Secondary | ICD-10-CM | POA: Diagnosis not present

## 2019-11-20 DIAGNOSIS — R110 Nausea: Secondary | ICD-10-CM | POA: Diagnosis not present

## 2019-11-20 DIAGNOSIS — R1084 Generalized abdominal pain: Secondary | ICD-10-CM | POA: Diagnosis not present

## 2019-11-21 ENCOUNTER — Telehealth: Payer: Self-pay | Admitting: Internal Medicine

## 2019-11-21 NOTE — Telephone Encounter (Signed)
One hour after taking the Bactrim he was "shaking like Elvis ", had a fever, vomiting. His wife called 12 and they came and checked him out. They told him it could have been from the medicine or because of his diabetes. Please advise. He is scared to take another Bactrim.

## 2019-11-21 NOTE — Telephone Encounter (Signed)
Pt is requesting a call back from a nurse to discuss the medication BACTRIM, pt states this medication makes him very shaky, cold sweats, pt would like to know if an alternative medication could be called in.

## 2019-11-21 NOTE — Telephone Encounter (Signed)
This is unusual but we cannot ignore that it happened after taking the medication.  He has an appointment with Dr. Ronnald Ramp on October 20 and I would like him to discuss that with him but do not take any more of it.  We may need to go back to the Cipro as there are only some many options to try to prevent recurrent spontaneous bacterial peritonitis that he had which led to his prior hospitalization.  Did he check his blood sugar or did the EMS check it?  And what was the result

## 2019-11-21 NOTE — Telephone Encounter (Signed)
Bryan informed as Dr [name] advised. And yes EMS checked his blood sugar and got 164. He checked it and got 146. He had not eaten but had had orange juice. He wanted [place] to know that his urine is dark in color. I told him to make sure he tells Dr Ronnald Ramp this in case he wants to test it. He said there is not foul smell to the urine, no burning.

## 2019-11-23 ENCOUNTER — Encounter: Payer: Self-pay | Admitting: Internal Medicine

## 2019-11-23 ENCOUNTER — Other Ambulatory Visit: Payer: Self-pay

## 2019-11-23 ENCOUNTER — Ambulatory Visit (INDEPENDENT_AMBULATORY_CARE_PROVIDER_SITE_OTHER): Payer: Medicare Other | Admitting: Internal Medicine

## 2019-11-23 VITALS — BP 116/64 | HR 83 | Temp 98.6°F | Resp 16 | Ht 74.0 in | Wt 239.0 lb

## 2019-11-23 DIAGNOSIS — R351 Nocturia: Secondary | ICD-10-CM

## 2019-11-23 DIAGNOSIS — E519 Thiamine deficiency, unspecified: Secondary | ICD-10-CM | POA: Diagnosis not present

## 2019-11-23 DIAGNOSIS — I10 Essential (primary) hypertension: Secondary | ICD-10-CM

## 2019-11-23 DIAGNOSIS — E118 Type 2 diabetes mellitus with unspecified complications: Secondary | ICD-10-CM | POA: Diagnosis not present

## 2019-11-23 DIAGNOSIS — Z23 Encounter for immunization: Secondary | ICD-10-CM | POA: Diagnosis not present

## 2019-11-23 DIAGNOSIS — Z794 Long term (current) use of insulin: Secondary | ICD-10-CM

## 2019-11-23 DIAGNOSIS — E538 Deficiency of other specified B group vitamins: Secondary | ICD-10-CM

## 2019-11-23 DIAGNOSIS — N401 Enlarged prostate with lower urinary tract symptoms: Secondary | ICD-10-CM | POA: Diagnosis not present

## 2019-11-23 LAB — PSA: PSA: 0.26 ng/mL (ref 0.10–4.00)

## 2019-11-23 MED ORDER — TOUJEO MAX SOLOSTAR 300 UNIT/ML ~~LOC~~ SOPN: 50.0000 [IU] | PEN_INJECTOR | Freq: Every day | SUBCUTANEOUS | 3 refills | Status: DC

## 2019-11-23 MED ORDER — DAPAGLIFLOZIN PROPANEDIOL 10 MG PO TABS: 10.0000 mg | ORAL_TABLET | Freq: Every day | ORAL | 0 refills | Status: DC

## 2019-11-23 MED ORDER — METFORMIN HCL 500 MG PO TABS: 500.0000 mg | ORAL_TABLET | Freq: Two times a day (BID) | ORAL | 0 refills | Status: DC

## 2019-11-23 NOTE — Progress Notes (Signed)
Subjective:  Patient ID: Patrick Kidd, male    DOB: 08-20-1945  Age: 74 y.o. MRN: [id number]  CC: Diabetes  This visit occurred during the SARS-CoV-2 public health emergency.  Safety protocols were in place, including screening questions prior to the visit, additional usage of staff PPE, and extensive cleaning of exam room while observing appropriate contact time as indicated for disinfecting solutions.    HPI Patrick Kidd presents for f/up - He checks his blood sugar a couple times a week and says his numbers are usually around 140.  A gastroenterologist recently prescribed SMX-TMP to prevent spontaneous bacterial peritonitis and a few days ago he had a reaction to it with nausea and vomiting.  He says his blood sugar at the time was 146.  He said EMS came to his house and his blood sugar was 164.  He has had some weight gain but he denies polys.  He tells me he is compliant with the basal insulin and Metformin.  Outpatient Medications Prior to Visit  Medication Sig Dispense Refill  . atenolol (TENORMIN) 25 MG tablet Take one tablet as needed for heart racing/palpitations.  May take up to 2 tablets in 24 hours. 60 tablet 3  . azelastine (ASTELIN) 0.1 % nasal spray Place 2 sprays into both nostrils 2 (two) times daily. Use in each nostril as directed 90 mL 1  . blood glucose meter kit and supplies KIT Use to test blood sugar once daily. DX E11.8 1 each 0  . Blood Glucose Monitoring Suppl (FREESTYLE FREEDOM LITE) w/Device KIT Use to check blood sugars 2 times daily 1 each 1  . Colchicine (MITIGARE) 0.6 MG CAPS Take 1 tablet by mouth daily. 90 capsule 0  . folic acid (FOLVITE) 1 MG tablet TAKE 1 TABLET EVERY DAY 90 tablet 1  . furosemide (LASIX) 20 MG tablet TAKE 1 TABLET(20 MG) BY MOUTH DAILY 90 tablet 1  . gabapentin (NEURONTIN) 300 MG capsule Take 1 capsule (300 mg total) by mouth 2 (two) times daily. 180 capsule 3  . glucose blood (COOL BLOOD GLUCOSE TEST STRIPS) test strip Use  to check blood sugar daily. DX: E11.8 300 each 3  . lactulose (CHRONULAC) 10 GM/15ML solution TAKE 15 MLS (10 G TOTAL) BY MOUTH 2 (TWO) TIMES DAILY. 946 mL 2  . Lancets (ACCU-CHEK MULTICLIX) lancets Use to check sugar daily. DX: E11.8 100 each 12  . lisinopril (ZESTRIL) 10 MG tablet TAKE 1 TABLET EVERY DAY 90 tablet 0  . Multiple Vitamin (MULTIVITAMIN WITH MINERALS) TABS tablet Take 1 tablet by mouth daily.    . pantoprazole (PROTONIX) 40 MG tablet Take 1 tablet (40 mg total) by mouth daily. 90 tablet 1  . potassium chloride SA (KLOR-CON) 20 MEQ tablet Take 1 tablet (20 mEq total) by mouth daily. 90 tablet 1  . rifaximin (XIFAXAN) 550 MG TABS tablet Take 1 tablet (550 mg total) by mouth 2 (two) times daily. 180 tablet 3  . sildenafil (REVATIO) 20 MG tablet Take 2 tablets (40 mg total) by mouth daily as needed. 40 tablet 4  . thiamine 100 MG tablet Take 1 tablet (100 mg total) by mouth every other day. 45 tablet 1  . cyclobenzaprine (FLEXERIL) 5 MG tablet Take 1 to 2 tabs at bedtime if needed. 30 tablet 1  . Insulin Glargine, 2 Unit Dial, (TOUJEO MAX SOLOSTAR) 300 UNIT/ML SOPN Inject 30 Units into the skin daily. 3 mL 3  . metFORMIN (GLUCOPHAGE) 500 MG tablet TAKE 1 TABLET  BY MOUTH TWICE A DAY WITH MEALS 180 tablet 0  . sulfamethoxazole-trimethoprim (BACTRIM DS) 800-160 MG tablet Take 1 tablet by mouth daily. 90 tablet 1   No facility-administered medications prior to visit.    ROS Review of Systems  Constitutional: Positive for unexpected weight change. Negative for appetite change, chills, diaphoresis and fatigue.  HENT: Negative.  Negative for trouble swallowing.   Eyes: Negative.   Respiratory: Negative for cough, chest tightness, shortness of breath and wheezing.   Cardiovascular: Negative for chest pain, palpitations and leg swelling.  Gastrointestinal: Negative for abdominal pain, constipation, diarrhea, nausea and vomiting.  Endocrine: Negative.   Genitourinary: Negative.  Negative  for difficulty urinating and dysuria.  Musculoskeletal: Negative.   Skin: Negative.   Neurological: Negative.  Negative for dizziness, weakness and light-headedness.  Hematological: Negative for adenopathy. Does not bruise/bleed easily.  Psychiatric/Behavioral: Negative.     Objective:  BP 116/64   Pulse 83   Temp 98.6 F (37 C) (Oral)   Resp 16   Ht 6' 2"  (1.88 m)   Wt 239 lb (108.4 kg)   SpO2 98%   BMI 30.69 kg/m   BP Readings from Last 3 Encounters:  11/23/19 116/64  11/16/19 120/70  08/26/19 (!) 141/81    Wt Readings from Last 3 Encounters:  11/23/19 239 lb (108.4 kg)  11/16/19 237 lb (107.5 kg)  08/26/19 (!) 227 lb (103 kg)    Physical Exam Vitals reviewed.  Constitutional:      General: He is not in acute distress.    Appearance: He is not ill-appearing, toxic-appearing or diaphoretic.  HENT:     Nose: Nose normal.     Mouth/Throat:     Mouth: Mucous membranes are moist.  Eyes:     General: No scleral icterus.    Conjunctiva/sclera: Conjunctivae normal.  Cardiovascular:     Rate and Rhythm: Normal rate and regular rhythm.     Heart sounds: No murmur heard.   Pulmonary:     Effort: Pulmonary effort is normal.     Breath sounds: No stridor. No wheezing, rhonchi or rales.  Abdominal:     General: Abdomen is flat. Bowel sounds are normal. There is distension.     Palpations: There is no hepatomegaly, splenomegaly or mass.     Tenderness: There is no abdominal tenderness. There is no rebound.  Musculoskeletal:        General: Normal range of motion.     Cervical back: Neck supple.     Right lower leg: No edema.     Left lower leg: No edema.  Skin:    General: Skin is warm and dry.     Coloration: Skin is not pale.  Neurological:     General: No focal deficit present.     Mental Status: He is alert.  Psychiatric:        Mood and Affect: Mood normal.        Behavior: Behavior normal.     Lab Results  Component Value Date   WBC 7.2 11/16/2019    HGB 14.4 11/16/2019   HCT 41.4 11/16/2019   PLT 89.0 (L) 11/16/2019   GLUCOSE 199 (H) 11/16/2019   CHOL 153 03/14/2019   TRIG 134.0 03/14/2019   HDL 45.00 03/14/2019   LDLCALC 81 03/14/2019   ALT 12 11/16/2019   AST 16 11/16/2019   NA 137 11/16/2019   K 4.3 11/16/2019   CL 102 11/16/2019   CREATININE 1.12 11/16/2019   BUN 17  11/16/2019   CO2 27 11/16/2019   TSH 1.830 01/29/2019   PSA 0.26 11/23/2019   INR 1.0 11/16/2019   HGBA1C 8.4 (H) 11/16/2019   MICROALBUR 3.4 (H) 03/14/2019    CT Abdomen Pelvis W Contrast  Result Date: 01/28/2019 CLINICAL DATA:  Fall right flank hematoma EXAM: CT ABDOMEN AND PELVIS WITH CONTRAST TECHNIQUE: Multidetector CT imaging of the abdomen and pelvis was performed using the standard protocol following bolus administration of intravenous contrast. CONTRAST:  141m OMNIPAQUE IOHEXOL 300 MG/ML  SOLN COMPARISON:  July 27, 2018 FINDINGS: Lower chest: The visualized heart size within normal limits. No pericardial fluid/thickening. No hiatal hernia. Streaky atelectasis seen at both lung bases. Hepatobiliary: Again noted is hepatomegaly with a slightly nodular liver contour, consistent with cirrhosis. There is a low-density lesion in the posterior right liver lobe measuring 6.2 cm, unchanged from the prior CT. The gallbladder appears to be fluid-filled and mildly distended with a small amount of pericholecystic fluid and fat stranding changes. No definite layering gallstones however are present. No biliary ductal dilatation. Pancreas: Unremarkable. No pancreatic ductal dilatation or surrounding inflammatory changes. Spleen: Normal in size without focal abnormality. Adrenals/Urinary Tract: Both adrenal glands appear normal. The kidneys and collecting system appear normal without evidence of urinary tract calculus or hydronephrosis. Bladder is unremarkable. Stomach/Bowel: The stomach, small bowel, and colon are normal in appearance. No inflammatory changes, wall  thickening, or obstructive findings.Scattered colonic diverticula are noted without diverticulitis. The appendix is unremarkable. Vascular/Lymphatic: There are no enlarged mesenteric, retroperitoneal, or pelvic lymph nodes. A small amount of perihepatic ascites is noted. Scattered aortic atherosclerotic calcifications are seen without aneurysmal dilatation. Reproductive: The prostate contains radiation prostate seeds. Other: No evidence of abdominal wall mass or hernia. Musculoskeletal: No acute or significant osseous findings. IMPRESSION: Mildly distended gallbladder with a small amount of pericholecystic fluid and surrounding inflammatory changes. This could be due to cholecystitis. If further evaluation is required would recommend right upper quadrant ultrasound Hepatomegaly and findings suggestive of cirrhosis. Small perihepatic ascites Aortic Atherosclerosis (ICD10-I70.0). Unchanged 6 cm hepatic cyst. Electronically Signed   By: BPrudencio PairM.D.   On: 01/28/2019 23:23   DG Chest Port 1 View  Result Date: 01/28/2019 CLINICAL DATA:  Shortness of breath, diagnosed with COVID-19 on 01/03/2019, increased fatigue, jaundice, cirrhosis EXAM: PORTABLE CHEST 1 VIEW COMPARISON:  Portable exam 1759 hours compared to 02/19/2018 FINDINGS: Normal heart size, mediastinal contours, and pulmonary vascularity. Atherosclerotic calcification aorta. Mild LEFT basilar atelectasis. Lungs otherwise clear. No pulmonary infiltrate, pleural effusion or pneumothorax. Multilevel endplate spur formation thoracic spine. IMPRESSION: Mild LEFT basilar atelectasis. Electronically Signed   By: MLavonia DanaM.D.   On: 01/28/2019 18:17   ECHOCARDIOGRAM COMPLETE  Result Date: 01/29/2019   ECHOCARDIOGRAM REPORT   Patient Name:   LKAYDN KUMPFDate of Exam: 01/29/2019 Medical Rec #:  [id number]         Height:       74.0 in Accession #:    [id number]        Weight:       235.0 lb Date of Birth:  [birth year]         BSA:          2.33 m  Patient Age:    72years           BP:           134/68 mmHg Patient Gender: M  HR:           117 bpm. Exam Location:  Inpatient Procedure: 2D Echo and Intracardiac Opacification Agent Indications:    Elevated troponin; I10 Hypertension  History:        Patient has no prior history of Echocardiogram examinations.                 Signs/Symptoms:Altered Mental Status. Covid19.  Sonographer:    Merrie Roof RDCS Referring Phys: [place]  1. Left ventricular ejection fraction, by visual estimation, is 60 to 65%. The left ventricle has normal function. There is no left ventricular hypertrophy.  2. Definity contrast agent was given IV to delineate the left ventricular endocardial borders.  3. Left ventricular diastolic parameters are consistent with Grade I diastolic dysfunction (impaired relaxation).  4. The left ventricle has no regional wall motion abnormalities.  5. Global right ventricle has normal systolic function.The right ventricular size is normal. No increase in right ventricular wall thickness.  6. Left atrial size was normal.  7. Right atrial size was normal.  8. The mitral valve is normal in structure. No evidence of mitral valve regurgitation. No evidence of mitral stenosis.  9. The tricuspid valve is normal in structure. 10. The aortic valve is normal in structure. Aortic valve regurgitation is not visualized. No evidence of aortic valve sclerosis or stenosis. 11. The pulmonic valve was normal in structure. Pulmonic valve regurgitation is not visualized. 12. The inferior vena cava is normal in size with greater than 50% respiratory variability, suggesting right atrial pressure of 3 mmHg. FINDINGS  Left Ventricle: Left ventricular ejection fraction, by visual estimation, is 60 to 65%. The left ventricle has normal function. Definity contrast agent was given IV to delineate the left ventricular endocardial borders. The left ventricle has no regional wall motion abnormalities.  There is no left ventricular hypertrophy. Left ventricular diastolic parameters are consistent with Grade I diastolic dysfunction (impaired relaxation). Normal left atrial pressure. Right Ventricle: The right ventricular size is normal. No increase in right ventricular wall thickness. Global RV systolic function is has normal systolic function. Left Atrium: Left atrial size was normal in size. Right Atrium: Right atrial size was normal in size Pericardium: There is no evidence of pericardial effusion. Mitral Valve: The mitral valve is normal in structure. No evidence of mitral valve regurgitation. No evidence of mitral valve stenosis by observation. Tricuspid Valve: The tricuspid valve is normal in structure. Tricuspid valve regurgitation is not demonstrated. Aortic Valve: The aortic valve is normal in structure. Aortic valve regurgitation is not visualized. The aortic valve is structurally normal, with no evidence of sclerosis or stenosis. Pulmonic Valve: The pulmonic valve was normal in structure. Pulmonic valve regurgitation is not visualized. Pulmonic regurgitation is not visualized. Aorta: The aortic root, ascending aorta and aortic arch are all structurally normal, with no evidence of dilitation or obstruction. Venous: The inferior vena cava is normal in size with greater than 50% respiratory variability, suggesting right atrial pressure of 3 mmHg. IAS/Shunts: No atrial level shunt detected by color flow Doppler. There is no evidence of a patent foramen ovale. No ventricular septal defect is seen or detected. There is no evidence of an atrial septal defect.  LEFT VENTRICLE PLAX 2D LVIDd:         4.90 cm LVIDs:         3.20 cm LV PW:         1.10 cm LV IVS:  1.10 cm LVOT diam:     1.90 cm LV SV:         72 ml LV SV Index:   30.16 LVOT Area:     2.84 cm  LEFT ATRIUM           Index LA diam:      4.10 cm 1.76 cm/m LA Vol (A2C): 23.7 ml 10.18 ml/m LA Vol (A4C): 53.9 ml 23.16 ml/m  AORTIC VALVE LVOT Vmax:    150.00 cm/s LVOT Vmean:  7010.000 cm/s  SHUNTS Systemic Diam: 1.90 cm  Candee Furbish MD Electronically signed by Candee Furbish MD Signature Date/Time: 01/29/2019/3:45:26 PM    Final    US Abdomen Limited RUQ  Result Date: 01/29/2019 CLINICAL DATA:  Jaundice EXAM: ULTRASOUND ABDOMEN LIMITED RIGHT UPPER QUADRANT COMPARISON:  None. FINDINGS: Gallbladder: Layering sludge/stones seen within gallbladder there is a edematous mildly prominent gallbladder wall measuring 3.1 mm. No sonographic Murphy sign. Common bile duct: Diameter: 3.7 mm Liver: Increased echotexture seen throughout. There is an anechoic cyst within the right liver lobe measuring 7.3 x 5.7 x 5.1 cm. The main portal vein appears patent. Other: None. IMPRESSION: Layering sludge/stones with mild gallbladder wall thickening. This could be due to mild acute cholecystitis. Electronically Signed   By: Prudencio Pair M.D.   On: 01/29/2019 01:55    Assessment & Plan:   Treavon was seen today for diabetes.  Diagnoses and all orders for this visit:  Flu vaccine need -     Flu Vaccine QUAD High Dose(Fluad)  Type 2 diabetes mellitus with complication, with long-term current use of insulin (HCC)-his recent A1c was up to 8.4%.  In addition to lifestyle modifications I recommended that he stay on the same dose of Metformin, make a slight increase in the dose of the basal insulin, and to add on an SGLT2 inhibitor. -     insulin glargine, 2 Unit Dial, (TOUJEO MAX SOLOSTAR) 300 UNIT/ML Solostar Pen; Inject 50 Units into the skin daily. -     dapagliflozin propanediol (FARXIGA) 10 MG TABS tablet; Take 1 tablet (10 mg total) by mouth daily before breakfast. -     metFORMIN (GLUCOPHAGE) 500 MG tablet; Take 1 tablet (500 mg total) by mouth 2 (two) times daily with a meal. -     Glucagon (GVOKE HYPOPEN 2-PACK) 1 MG/0.2ML SOAJ; Inject 1 Act into the skin daily as needed.  Essential hypertension- His blood pressure is adequately well controlled.  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 -     Vitamin B1; Future -     Vitamin B1  BPH associated with nocturia- His PSA is low which is a reassuring sign that there is no recurrence of prostate cancer. -     PSA; Future -     PSA   I have discontinued Hollice Espy B. Raj "Bryan"'s cyclobenzaprine and sulfamethoxazole-trimethoprim. I have also changed his Toujeo Max SoloStar and metFORMIN. Additionally, I am having him start on dapagliflozin propanediol and Gvoke HypoPen 2-Pack. Lastly, I am having him maintain his FreeStyle Freedom Lite, blood glucose meter kit and supplies, accu-chek multiclix, multivitamin with minerals, atenolol, pantoprazole, rifaximin, Cool Blood Glucose Test Strips, azelastine, Colchicine, sildenafil, thiamine, gabapentin, furosemide, folic acid, lisinopril, lactulose, and potassium chloride SA.  Meds ordered this encounter  Medications  . insulin glargine, 2 Unit Dial, (TOUJEO MAX SOLOSTAR) 300 UNIT/ML Solostar Pen    Sig: Inject 50 Units into the skin daily.  Dispense:  3 mL    Refill:  3  . dapagliflozin propanediol (FARXIGA) 10 MG TABS tablet    Sig: Take 1 tablet (10 mg total) by mouth daily before breakfast.    Dispense:  112 tablet    Refill:  0  . metFORMIN (GLUCOPHAGE) 500 MG tablet    Sig: Take 1 tablet (500 mg total) by mouth 2 (two) times daily with a meal.    Dispense:  180 tablet    Refill:  0  . Glucagon (GVOKE HYPOPEN 2-PACK) 1 MG/0.2ML SOAJ    Sig: Inject 1 Act into the skin daily as needed.    Dispense:  2 mL    Refill:  5     Follow-up: Return in about 3 months (around 02/23/2020).  Scarlette Calico, MD

## 2019-11-23 NOTE — Patient Instructions (Signed)
Type 2 Diabetes Mellitus, Diagnosis, Adult Type 2 diabetes (type 2 diabetes mellitus) is a long-term (chronic) disease. In type 2 diabetes, one or both of these problems may be present:  The pancreas does not make enough of a hormone called insulin.  Cells in the body do not respond properly to insulin that the body makes (insulin resistance). Normally, insulin allows blood sugar (glucose) to enter cells in the body. The cells use glucose for energy. Insulin resistance or lack of insulin causes excess glucose to build up in the blood instead of going into cells. As a result, high blood glucose (hyperglycemia) develops. What increases the risk? The following factors may make you more likely to develop type 2 diabetes:  Having a family member with type 2 diabetes.  Being overweight or obese.  Having an inactive (sedentary) lifestyle.  Having been diagnosed with insulin resistance.  Having a history of prediabetes, gestational diabetes, or polycystic ovary syndrome (PCOS).  Being of American-Indian, African-American, Hispanic/Latino, or Asian/Pacific Islander descent. What are the signs or symptoms? In the early stage of this condition, you may not have symptoms. Symptoms develop slowly and may include:  Increased thirst (polydipsia).  Increased hunger(polyphagia).  Increased urination (polyuria).  Increased urination during the night (nocturia).  Unexplained weight loss.  Frequent infections that keep coming back (recurring).  Fatigue.  Weakness.  Vision changes, such as blurry vision.  Cuts or bruises that are slow to heal.  Tingling or numbness in the hands or feet.  Dark patches on the skin (acanthosis nigricans). How is this diagnosed? This condition is diagnosed based on your symptoms, your medical history, a physical exam, and your blood glucose level. Your blood glucose may be checked with one or more of the following blood tests:  A fasting blood glucose (FBG)  test. You will not be allowed to eat (you will fast) for 8 hours or longer before a blood sample is taken.  A random blood glucose test. This test checks blood glucose at any time of day regardless of when you ate.  An A1c (hemoglobin A1c) blood test. This test provides information about blood glucose control over the previous 2-3 months.  An oral glucose tolerance test (OGTT). This test measures your blood glucose at two times: ? After fasting. This is your baseline blood glucose level. ? Two hours after drinking a beverage that contains glucose. You may be diagnosed with type 2 diabetes if:  Your FBG level is 126 mg/dL (7.0 mmol/L) or higher.  Your random blood glucose level is 200 mg/dL (11.1 mmol/L) or higher.  Your A1c level is 6.5% or higher.  Your OGTT result is higher than 200 mg/dL (11.1 mmol/L). These blood tests may be repeated to confirm your diagnosis. How is this treated? Your treatment may be managed by a specialist called an endocrinologist. Type 2 diabetes may be treated by following instructions from your health care provider about:  Making diet and lifestyle changes. This may include: ? Following an individualized nutrition plan that is developed by a diet and nutrition specialist (registered dietitian). ? Exercising regularly. ? Finding ways to manage stress.  Checking your blood glucose level as often as told.  Taking diabetes medicines or insulin daily. This helps to keep your blood glucose levels in the healthy range. ? If you use insulin, you may need to adjust the dosage depending on how physically active you are and what foods you eat. Your health care provider will tell you how to adjust your dosage.    Taking medicines to help prevent complications from diabetes, such as: ? Aspirin. ? Medicine to lower cholesterol. ? Medicine to control blood pressure. Your health care provider will set individualized treatment goals for you. Your goals will be based on  your age, other medical conditions you have, and how you respond to diabetes treatment. Generally, the goal of treatment is to maintain the following blood glucose levels:  Before meals (preprandial): 80-130 mg/dL (4.4-7.2 mmol/L).  After meals (postprandial): below 180 mg/dL (10 mmol/L).  A1c level: less than 7%. Follow these instructions at home: Questions to ask your health care provider  Consider asking the following questions: ? Do I need to meet with a diabetes educator? ? Where can I find a support group for people with diabetes? ? What equipment will I need to manage my diabetes at home? ? What diabetes medicines do I need, and when should I take them? ? How often do I need to check my blood glucose? ? What number can I call if I have questions? ? When is my next appointment? General instructions  Take over-the-counter and prescription medicines only as told by your health care provider.  Keep all follow-up visits as told by your health care provider. This is important.  For more information about diabetes, visit: ? American Diabetes Association (ADA): www.diabetes.org ? American Association of Diabetes Educators (AADE): www.diabeteseducator.org Contact a health care provider if:  Your blood glucose is at or above 240 mg/dL (13.3 mmol/L) for 2 days in a row.  You have been sick or have had a fever for 2 days or longer, and you are not getting better.  You have any of the following problems for more than 6 hours: ? You cannot eat or drink. ? You have nausea and vomiting. ? You have diarrhea. Get help right away if:  Your blood glucose is lower than 54 mg/dL (3.0 mmol/L).  You become confused or you have trouble thinking clearly.  You have difficulty breathing.  You have moderate or large ketone levels in your urine. Summary  Type 2 diabetes (type 2 diabetes mellitus) is a long-term (chronic) disease. In type 2 diabetes, the pancreas does not make enough of a  hormone called insulin, or cells in the body do not respond properly to insulin that the body makes (insulin resistance).  This condition is treated by making diet and lifestyle changes and taking diabetes medicines or insulin.  Your health care provider will set individualized treatment goals for you. Your goals will be based on your age, other medical conditions you have, and how you respond to diabetes treatment.  Keep all follow-up visits as told by your health care provider. This is important. This information is not intended to replace advice given to you by your health care provider. Make sure you discuss any questions you have with your health care provider. Document Revised: 03/20/2017 Document Reviewed: 02/23/2015 Elsevier Patient Education  2020 [employer]  

## 2019-11-24 MED ORDER — GVOKE HYPOPEN 2-PACK 1 MG/0.2ML ~~LOC~~ SOAJ: 1.0000 | Freq: Every day | SUBCUTANEOUS | 5 refills | Status: DC | PRN

## 2019-11-29 LAB — VITAMIN B1: Vitamin B1 (Thiamine): 21 nmol/L (ref 8–30)

## 2019-12-02 NOTE — Telephone Encounter (Signed)
Please tell the patient I recommend he restart the Cipro he was taking before.  We had stopped it wondering if it might of had something to do with some of his tendinitis but I think given his reaction to the trimethoprim sulfamethoxazole that his best bet to prevent recurrent peritonitis.  We can add that back to his medication list.

## 2019-12-02 NOTE — Telephone Encounter (Signed)
I spoke with Patrick Kidd and he said he is so much better that he can get up without assistance He said his legs are better. He is hesitant to restart the cipro. Wants to know if he can try being off on the medicine for a bit? Please advise Sir, thank you.

## 2019-12-05 DIAGNOSIS — 419620001 Death: Secondary | SNOMED CT | POA: Diagnosis not present

## 2019-12-05 NOTE — Telephone Encounter (Signed)
Patient informed and he would like to come see Dr [name] on a regular basis. The last office visit said return in 6 months but he prefers to do it more often than that Sir.

## 2019-12-05 NOTE — Telephone Encounter (Signed)
Makes sense to stay off cipro  I will look for an alternative medication

## 2019-12-05 DEATH — deceased

## 2019-12-06 NOTE — Telephone Encounter (Signed)
Patient informed and he is going to mark it on his calendar to call us after thanksgiving.

## 2019-12-06 NOTE — Telephone Encounter (Signed)
Tell him to call us in late November for a January or February appointment

## 2019-12-13 ENCOUNTER — Ambulatory Visit: Payer: Medicare Other | Attending: Internal Medicine

## 2019-12-13 DIAGNOSIS — Z23 Encounter for immunization: Secondary | ICD-10-CM

## 2019-12-13 NOTE — Progress Notes (Signed)
° °  Covid-19 Vaccination Clinic  Name:  HANDSOME ANGLIN    MRN: [id number] DOB: [birth year]  12/13/2019  Mr. Nevils was observed post Covid-19 immunization for 15 minutes without incident. He was provided with Vaccine Information Sheet and instruction to access the V-Safe system.   Mr. Looper was instructed to call 911 with any severe reactions post vaccine:  Difficulty breathing   Swelling of face and throat   A fast heartbeat   A bad rash all over body   Dizziness and weakness

## 2020-01-04 ENCOUNTER — Telehealth: Payer: Self-pay

## 2020-01-04 ENCOUNTER — Telehealth: Payer: Medicare Other

## 2020-01-04 ENCOUNTER — Other Ambulatory Visit: Payer: Self-pay

## 2020-01-04 DIAGNOSIS — K7031 Alcoholic cirrhosis of liver with ascites: Secondary | ICD-10-CM

## 2020-01-04 NOTE — Chronic Care Management (AMB) (Deleted)
Chronic Care Management Pharmacy  Name: KAPIL PETROPOULOS  MRN: [id number] DOB: 10-May-1945  Chief Complaint/ HPI  Patrick Kidd,  74 y.o. , male presents for their Follow-Up CCM visit with the clinical pharmacist via telephone.  PCP : [name], MD  Their chronic conditions include: Hypertension, Hyperlipidemia, Diabetes, GERD, Overactive Bladder and BPH, alcoholic cirrhosis, DJD, prostate cancer, allergies, Peripheral vascular disease  Born in [place], married wife [name] in 1979, married 45 years, 2 boys and 3 grandchildren. Lives in [facility], goes to swimming pool 1 hour every day.  Office Visits: 11/23/19 Dr Ronnald Ramp OV: chronic f/u. A1c up to 8.4%, added Fargixa 10 mg. DC'd bactrim due to side effect.  06/09/19 Dr Ronnald Ramp OV: gout flare resolved on its own. Uric acid elevated, started colchicine daily. Abstaining from alcohol, encephalopathy improved. MELD = 8. Rx'd Astelin for allergies, sildenafil 20-40 mg for ED. 04/22/19 crestor changed to lipitor for cost savings.  Consult Visit: 11/16/19 Dr [name] (GI): f/u cirrhosis, SBP. Changing cipro to Septra given leg/tendon complaints. Beta blockers are contraindicated, fortunately no varices. 08/26/19 Dr Tomi Likens (neurology): diabetic neuropathy, gabapentin refilled. 07/19/19 Dr Lovena Le (cardiology): stopped lisinopril due to good BP control, stopped statin due to cirrhosis. 05/24/19 Home care visit: no changes 04/14/19 Dietician visit for DM. 04/13/19 DM eye exam. 01/28/19 - hospitalization for jaundice for 5 wks.  Allergies  Allergen Reactions  . 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  Use with caution  . Calcium Channel Blockers     Likely WPW.  Use with caution.    Medications: Outpatient Encounter Medications as of 01/04/2020  Medication Sig Note  . atenolol (TENORMIN) 25 MG tablet Take one tablet as needed for heart racing/palpitations.  May take up to 2 tablets in 24 hours.   Marland Kitchen azelastine (ASTELIN) 0.1 % nasal spray  Place 2 sprays into both nostrils 2 (two) times daily. Use in each nostril as directed 11/16/2019: PRN  . blood glucose meter kit and supplies KIT Use to test blood sugar once daily. DX E11.8   . Blood Glucose Monitoring Suppl (FREESTYLE FREEDOM LITE) w/Device KIT Use to check blood sugars 2 times daily   . Colchicine (MITIGARE) 0.6 MG CAPS Take 1 tablet by mouth daily. 11/16/2019: PRN  . dapagliflozin propanediol (FARXIGA) 10 MG TABS tablet Take 1 tablet (10 mg total) by mouth daily before breakfast.   . folic acid (FOLVITE) 1 MG tablet TAKE 1 TABLET EVERY DAY   . furosemide (LASIX) 20 MG tablet TAKE 1 TABLET(20 MG) BY MOUTH DAILY   . gabapentin (NEURONTIN) 300 MG capsule Take 1 capsule (300 mg total) by mouth 2 (two) times daily.   . Glucagon (GVOKE HYPOPEN 2-PACK) 1 MG/0.2ML SOAJ Inject 1 Act into the skin daily as needed.   Marland Kitchen glucose blood (COOL BLOOD GLUCOSE TEST STRIPS) test strip Use to check blood sugar daily. DX: E11.8   . insulin glargine, 2 Unit Dial, (TOUJEO MAX SOLOSTAR) 300 UNIT/ML Solostar Pen Inject 50 Units into the skin daily.   Marland Kitchen lactulose (CHRONULAC) 10 GM/15ML solution TAKE 15 MLS (10 G TOTAL) BY MOUTH 2 (TWO) TIMES DAILY.   Marland Kitchen Lancets (ACCU-CHEK MULTICLIX) lancets Use to check sugar daily. DX: E11.8   . lisinopril (ZESTRIL) 10 MG tablet TAKE 1 TABLET EVERY DAY   . metFORMIN (GLUCOPHAGE) 500 MG tablet Take 1 tablet (500 mg total) by mouth 2 (two) times daily with a meal.   . Multiple Vitamin (MULTIVITAMIN WITH MINERALS) TABS tablet  Take 1 tablet by mouth daily.   . pantoprazole (PROTONIX) 40 MG tablet Take 1 tablet (40 mg total) by mouth daily.   . potassium chloride SA (KLOR-CON) 20 MEQ tablet Take 1 tablet (20 mEq total) by mouth daily.   . rifaximin (XIFAXAN) 550 MG TABS tablet Take 1 tablet (550 mg total) by mouth 2 (two) times daily.   . sildenafil (REVATIO) 20 MG tablet Take 2 tablets (40 mg total) by mouth daily as needed.   . thiamine 100 MG tablet Take 1 tablet (100  mg total) by mouth every other day.   . [DISCONTINUED] folic acid (FOLVITE) 1 MG tablet Take 1 tablet (1 mg total) by mouth daily.   . [DISCONTINUED] furosemide (LASIX) 20 MG tablet Take 1 tablet (20 mg total) by mouth daily.   . [DISCONTINUED] metFORMIN (GLUCOPHAGE) 500 MG tablet Take 1 tablet (500 mg total) by mouth 2 (two) times daily with a meal.   . [DISCONTINUED] potassium chloride (KLOR-CON) 20 MEQ packet Take 20 mEq by mouth daily.    No facility-administered encounter medications on file as of 01/04/2020.     Current Diagnosis/Assessment:    Goals Addressed   None     Hypertension   BP goal is:  <130/80  Office blood pressures are  BP Readings from Last 3 Encounters:  11/23/19 116/64  11/16/19 120/70  08/26/19 (!) 141/81   Kidney Function Lab Results  Component Value Date/Time   CREATININE 1.12 11/16/2019 11:36 AM   CREATININE 1.10 06/09/2019 02:49 PM   CREATININE 1.13 06/04/2010 01:54 PM   GFR 63.88 11/16/2019 11:36 AM   GFRNONAA >60 02/06/2019 05:46 AM   GFRAA >60 02/06/2019 05:46 AM   Patient checks BP at home infrequently Patient home BP readings are ranging: SBP 115-120 typically  Patient has failed these meds in the past: n/a Patient is currently controlled on the following medications:  . 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) - not taking   We discussed diet and exercise extensively; pt reports he has not had to take a dose of atenolol for palpitations; lisinopril was recently discontinued by cardiologist due to controlled BP  Plan  Continue current medications     Hyperlipidemia/PVD   LDL goal < 100  Lipid Panel     Component Value Date/Time   CHOL 153 03/14/2019 1036   TRIG 134.0 03/14/2019 1036   HDL 45.00 03/14/2019 1036   LDLCALC 81 03/14/2019 1036     The 10-year ASCVD risk score Mikey Bussing DC Jr., et al., 2013) is: 40%   Values used to calculate the score:     Age: 89 years     Sex: Male     Is Non-Hispanic African American: No      Diabetic: Yes     Tobacco smoker: No     Systolic Blood Pressure: 481 mmHg     Is BP treated: Yes     HDL Cholesterol: 45 mg/dL     Total Cholesterol: 153 mg/dL   Patient has failed these meds in past: rosuvastatin, atorvatatin Patient is currently controlled on the following medications:  . No medications  We discussed:  diet and exercise extensively; statin was recently discontinued by cardiologist due to cirrhosis.  Plan  Continue current medications  Diabetes   A1c goal < 7%  Recent Relevant Labs: Lab Results  Component Value Date/Time   HGBA1C 8.4 (H) 11/16/2019 11:35 AM   HGBA1C 6.8 (H) 06/09/2019 02:49 PM   GFR 63.88 11/16/2019 11:36 AM  GFR 65.32 06/09/2019 02:49 PM   MICROALBUR 3.4 (H) 03/14/2019 10:36 AM   MICROALBUR 1.3 10/10/2016 08:29 AM    Last diabetic Eye exam:  Lab Results  Component Value Date/Time   HMDIABEYEEXA No Retinopathy 04/13/2019 12:00 AM    Last diabetic Foot exam: No results found for: HMDIABFOOTEX   Checking BG: Daily  Recent FBG Readings: ***  Patient has failed these meds in past: n/a Patient is currently controlled on the following medications: . Toujeo Max 30 units daily  . Metformin 500 mg BID . Farxiga 10 mg daily (samples) . Glucagon (GVoke)  We discussed: diet and exercise extensively and how to recognize and treat signs of hypoglycemia; pt is interested in reducing or stopping insulin; discussed role of insulin in diabetes, blood sugar goals, and benefits of checking BG at different times of day. Pt also given samples of Toujeo - 2 boxes.  Plan  Continue current medications  GERD   Patient has failed these meds in past: n/a Patient is currently controlled on the following medications:  . Pantoprazole 40 mg daily  We discussed:  Pt reports he has significant GERD and cannot go without PPI.   Plan  Continue current medications  Neuropathy/DJD   Patient has failed these meds in past: cyclobenzaprine Patient is  currently controlled on the following medications:  . Gabapentin 300 mg BID  We discussed:  Pain is controlled with current dose of gabapentin  Plan  Continue current medications  Cirrhosis   Patient has failed these meds in past: Xifaxin (cost) Patient is currently controlled on the following medications:  . Lactulose 10g/50m BID . Furosemide 20 mg daily . Potassium chloride 20 mEq daily  We discussed: role of each medication in cirrhosis/hepatic encephalopathy/SBP prophylaxis. Recommended to discuss length of therapy with GI provider. Discussed importance of continued abstinence from alcohol.  Plan  Continue current medications  Medication Management   [facility] Uses pill box? No - keeps meds on counter Pt endorses 100% compliance  We discussed: [facility] and pt is satisfied with their service.   Plan  Continue current medication management strategy    Follow up: *** month phone visit  LCharlene Brooke PharmD, BCACP Clinical Pharmacist [phone]

## 2020-01-04 NOTE — Telephone Encounter (Signed)
Patient has been scheduled for abdominal ultrasound for 01/12/20 9:00 at [facility]  Left message for patient to call back

## 2020-01-04 NOTE — Telephone Encounter (Signed)
Patient is not available on 12/9.  He has been rescheduled to 01/23/20 9:00.  He verbalized understanding of instructions.

## 2020-01-06 ENCOUNTER — Telehealth: Payer: Self-pay | Admitting: Pharmacist

## 2020-01-06 NOTE — Telephone Encounter (Signed)
Received call from patient requesting help with Toujeo and [place] costs. He is out of [place] and asking for samples as well - provided #14 tablets, stored in front office cabinet.  Patient has [employer] and reports copay for [place] is cost prohibitive at this time.  Reviewed [place] process for [place]. Patient meets income/out of pocket spend criteria for the program. Patient will provide proof of income, out of pocket spend report, and will sign application. Will collaborate with prescriber Dr Ronnald Ramp for the provider portion of application. Once completed, application will be submitted via Fax   Patient will bring proof of income to office and sign application.  Charlton Haws, [facility]

## 2020-01-12 ENCOUNTER — Ambulatory Visit (HOSPITAL_COMMUNITY): Payer: Medicare Other

## 2020-01-19 ENCOUNTER — Telehealth: Payer: Self-pay | Admitting: Internal Medicine

## 2020-01-19 NOTE — Telephone Encounter (Signed)
   Patient calling to report runny nose, scratchy throat. Patient declined appt at this time He took a home COVID test; negative result Seeking advice for OTC medications

## 2020-01-20 ENCOUNTER — Ambulatory Visit: Payer: Medicare Other | Admitting: Internal Medicine

## 2020-01-23 ENCOUNTER — Other Ambulatory Visit: Payer: Self-pay

## 2020-01-23 ENCOUNTER — Ambulatory Visit (HOSPITAL_COMMUNITY)
Admission: RE | Admit: 2020-01-23 | Discharge: 2020-01-23 | Disposition: A | Discharge status: Home / Self Care | Payer: Medicare Other | Source: Ambulatory Visit | Attending: Internal Medicine | Admitting: Internal Medicine

## 2020-01-23 DIAGNOSIS — K7460 Unspecified cirrhosis of liver: Secondary | ICD-10-CM | POA: Diagnosis not present

## 2020-01-23 DIAGNOSIS — R161 Splenomegaly, not elsewhere classified: Secondary | ICD-10-CM | POA: Diagnosis not present

## 2020-01-23 DIAGNOSIS — K7031 Alcoholic cirrhosis of liver with ascites: Secondary | ICD-10-CM | POA: Diagnosis not present

## 2020-01-31 ENCOUNTER — Other Ambulatory Visit: Payer: Self-pay | Admitting: Internal Medicine

## 2020-01-31 DIAGNOSIS — I10 Essential (primary) hypertension: Secondary | ICD-10-CM

## 2020-03-04 ENCOUNTER — Other Ambulatory Visit: Payer: Self-pay | Admitting: Internal Medicine

## 2020-03-06 ENCOUNTER — Ambulatory Visit: Payer: Self-pay | Admitting: Pharmacist

## 2020-03-06 DIAGNOSIS — E118 Type 2 diabetes mellitus with unspecified complications: Secondary | ICD-10-CM

## 2020-03-06 NOTE — Progress Notes (Signed)
Received call from patient to discuss [place] and [place]. He also wants to get A1c re-checked.  He reports he is about to run out of medication (1 pill remaining of Farxiga and <1 pen remaining of Toujeo). He is requesting samples.  Provided Farxiga samples x 28 days and 1 Toujeo pen (~18 day supply). Pt will come pick up samples today. Also scheduled f/u appt with PCP for next week to re-check A1c.   Pt will apply for Farxiga PAP as well. Paperwork is ready for him to sign in office.  Charlene Brooke, PharmD, BCACP Clinical Pharmacist [facility] at [facility] [phone]

## 2020-03-12 ENCOUNTER — Ambulatory Visit (INDEPENDENT_AMBULATORY_CARE_PROVIDER_SITE_OTHER): Payer: Medicare Other | Admitting: Internal Medicine

## 2020-03-12 ENCOUNTER — Other Ambulatory Visit: Payer: Self-pay

## 2020-03-12 ENCOUNTER — Encounter: Payer: Self-pay | Admitting: Internal Medicine

## 2020-03-12 VITALS — BP 116/70 | HR 79 | Ht 74.0 in | Wt 243.0 lb

## 2020-03-12 DIAGNOSIS — I456 Pre-excitation syndrome: Secondary | ICD-10-CM

## 2020-03-12 DIAGNOSIS — I480 Paroxysmal atrial fibrillation: Secondary | ICD-10-CM | POA: Diagnosis not present

## 2020-03-12 NOTE — Progress Notes (Signed)
HPI Patrick Kidd returns today for followup. He is a pleasant 75 yo man with atrial fib, and cirrhosis who has gradually improved since being hospitalized 6 months ago. In the interim, he has gradually begun to increase his physical activity. He is now able to walk without assistance. He has not had any falls. He denies chest pain or sob. He has very minimal edema. He carries a diagnosis of cirrhosis. He has a remote h/o WPW but is not pre-excited. He has been taking atenolol with out difficulty. He admits to having 2 drinks in the last year. Allergies  Allergen Reactions  . 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  Use with caution  . Calcium Channel Blockers     Likely WPW.  Use with caution.     Current Outpatient Medications  Medication Sig Dispense Refill  . atenolol (TENORMIN) 25 MG tablet Take one tablet as needed for heart racing/palpitations.  May take up to 2 tablets in 24 hours. 60 tablet 3  . azelastine (ASTELIN) 0.1 % nasal spray Place 2 sprays into both nostrils 2 (two) times daily. Use in each nostril as directed 90 mL 1  . blood glucose meter kit and supplies KIT Use to test blood sugar once daily. DX E11.8 1 each 0  . Blood Glucose Monitoring Suppl (FREESTYLE FREEDOM LITE) w/Device KIT Use to check blood sugars 2 times daily 1 each 1  . Colchicine (MITIGARE) 0.6 MG CAPS Take 1 tablet by mouth daily. 90 capsule 0  . dapagliflozin propanediol (FARXIGA) 10 MG TABS tablet Take 1 tablet (10 mg total) by mouth daily before breakfast. 657 tablet 0  . folic acid (FOLVITE) 1 MG tablet TAKE 1 TABLET EVERY DAY 90 tablet 1  . furosemide (LASIX) 20 MG tablet TAKE 1 TABLET(20 MG) BY MOUTH DAILY 90 tablet 1  . gabapentin (NEURONTIN) 300 MG capsule Take 1 capsule (300 mg total) by mouth 2 (two) times daily. 180 capsule 3  . Glucagon (GVOKE HYPOPEN 2-PACK) 1 MG/0.2ML SOAJ Inject 1 Act into the skin daily as needed. 2 mL 5  . glucose blood (COOL BLOOD GLUCOSE TEST STRIPS) test strip  Use to check blood sugar daily. DX: E11.8 300 each 3  . insulin glargine, 2 Unit Dial, (TOUJEO MAX SOLOSTAR) 300 UNIT/ML Solostar Pen Inject 50 Units into the skin daily. 3 mL 3  . KLOR-CON M20 20 MEQ tablet TAKE 1 TABLET BY MOUTH EVERY DAY 90 tablet 1  . lactulose (CHRONULAC) 10 GM/15ML solution TAKE 15 MLS (10 G TOTAL) BY MOUTH 2 (TWO) TIMES DAILY. 946 mL 2  . Lancets (ACCU-CHEK MULTICLIX) lancets Use to check sugar daily. DX: E11.8 100 each 12  . lisinopril (ZESTRIL) 10 MG tablet TAKE 1 TABLET EVERY DAY 90 tablet 0  . metFORMIN (GLUCOPHAGE) 500 MG tablet Take 1 tablet (500 mg total) by mouth 2 (two) times daily with a meal. 180 tablet 0  . Multiple Vitamin (MULTIVITAMIN WITH MINERALS) TABS tablet Take 1 tablet by mouth daily.    . pantoprazole (PROTONIX) 40 MG tablet Take 1 tablet (40 mg total) by mouth daily. 90 tablet 1  . rifaximin (XIFAXAN) 550 MG TABS tablet Take 1 tablet (550 mg total) by mouth 2 (two) times daily. 180 tablet 3  . sildenafil (REVATIO) 20 MG tablet Take 2 tablets (40 mg total) by mouth daily as needed. 40 tablet 4  . thiamine 100 MG tablet Take 1 tablet (100 mg total) by mouth every  other day. 45 tablet 1   No current facility-administered medications for this visit.     Past Medical History:  Diagnosis Date  . Acrophobia   . Alcoholic cirrhosis ([place]) 09/28/4156  . Anemia   . Arthritis    foot by big toe  . BPH associated with nocturia   . Cataract    removed both eyes  . Chronic cough    PMH of  . COVID-19 12/2018  . Diabetes mellitus without complication ([place])   . Diverticulosis 07-03-2010   Colonoscopy.   . Duodenal ulcer 2017  . Fallen arches    Bilateral  . GERD (gastroesophageal reflux disease)   . Gout   . Granuloma annulare   . Hx of adenomatous colonic polyps multiple  . Hydrocele 2011   Large septated right hydrocele  . Liver cyst   . Liver lesion   . Nonspecific elevation of levels of transaminase or lactic acid dehydrogenase (LDH)   .  Obesity   . Peripheral neuropathy   . Plantar fasciitis    PMH of  . Portal hypertension ([place]) 2017  . Prostate cancer ([place])   . Right shoulder pain 11/2017  . Sleep apnea    no cpap, patient denies  . Thiamine deficiency   . Wears reading eyeglasses   . WPW (Wolff-Parkinson-White syndrome) 02/05/2019    ROS:   All systems reviewed and negative except as noted in the HPI.   Past Surgical History:  Procedure Laterality Date  . CATARACT EXTRACTION, BILATERAL  12/2011    Dr Gershon Crane  . COLONOSCOPY  2017  . COLONOSCOPY W/ POLYPECTOMY  07/03/2010   2 adenomas, diverticulosis on right. Dr [name]  . CYSTOSCOPY N/A 03/15/2018   Procedure: CYSTOSCOPY FLEXIBLE;  Surgeon: [name], MD;  Location: [facility];  Service: Urology;  Laterality: N/A;  NO SEEDS FOUND IN BLADDER  . ESOPHAGOGASTRODUODENOSCOPY (EGD) WITH PROPOFOL N/A 01/08/2016   Procedure: ESOPHAGOGASTRODUODENOSCOPY (EGD) WITH PROPOFOL;  Surgeon: [name], MD;  Location: [facility];  Service: Endoscopy;  Laterality: N/A;  . FLEXIBLE SIGMOIDOSCOPY  2000  . RADIOACTIVE SEED IMPLANT N/A 03/15/2018   Procedure: RADIOACTIVE SEED IMPLANT/BRACHYTHERAPY IMPLANT;  Surgeon: [name], MD;  Location[facility];  Service: Urology;  Laterality: N/A;   73 SEEDS IMPLANTED  . SIGMOIDOSCOPY    . SPACE OAR INSTILLATION N/A 03/15/2018   Procedure: SPACE OAR INSTILLATION;  Surgeon: [name], MD;  Location: [facility];  Service: Urology;  Laterality: N/A;  . WISDOM TOOTH EXTRACTION       Family History  Problem Relation Age of Onset  . Lung cancer Mother        smoker  . Leukemia Father        Acute myelocytic  . Diabetes Neg Hx   . Stroke Neg Hx   . Heart disease Neg Hx   . Colon cancer Neg Hx   . Colon polyps Neg Hx   . Esophageal cancer Neg Hx   . Rectal cancer Neg Hx   . Stomach cancer Neg Hx      Social History   Socioeconomic History  . Marital  status: Married    Spouse name: [name]  . Number of children: 2  . Years of education: Not on file  . Highest education level: Some college, no degree  Occupational History  . Occupation: Adult nurse estate  Tobacco Use  . Smoking status: Former Smoker  Packs/day: 2.00    Years: 6.00    Pack years: 12.00    Types: Cigarettes    Quit date: 02/04/1971    Years since quitting: 49.1  . Smokeless tobacco: Never Used  . Tobacco comment: smoked age [age], up to 2 ppd  Vaping Use  . Vaping Use: Never used  Substance and Sexual Activity  . Alcohol use: Not Currently    Alcohol/week: 28.0 standard drinks    Types: 28 Shots of liquor per week    Comment: No alochol since Christmas Day 2020  . Drug use: No  . Sexual activity: Yes    Partners: Female  Other Topics Concern  . Not on file  Social History Narrative   Worked in Adult nurse estate   2 children   Former smoker no drug use   Alcoholism, abstinent since 01/23/2019 most recently   Fun/Hobby: Play golf, grandchildren, [employer]      Patient is left-handed. He lives with his wife in a one level home. He swims most days.   Social Determinants of Health   Financial Resource Strain: Not on file  Food Insecurity: Not on file  Transportation Needs: Not on file  Physical Activity: Not on file  Stress: Not on file  Social Connections: Not on file  Intimate Partner Violence: Not on file     BP 116/70   Pulse 79   Ht 6' 2"  (1.88 m)   Wt 243 lb (110.2 kg)   SpO2 93%   BMI 31.20 kg/m   Physical Exam:  Well appearing NAD HEENT: Unremarkable Neck:  No JVD, no thyromegally Lymphatics:  No adenopathy Back:  No CVA tenderness Lungs:  Clear with no wheezes HEART:  Regular rate rhythm, no murmurs, no rubs, no clicks Abd:  soft, positive bowel sounds, no organomegally, no rebound, no guarding Ext:  2 plus pulses, no edema, no cyanosis, no clubbing Skin:  No rashes no nodules Neuro:  CN II through XII intact, motor  grossly intact  EKG - nsr with PVC   Assess/Plan: 1. Atrial fib - he has not had any symptoms since he was in the hospital. He will continue low dose atenolol. With his cirrhosis he is not a candidate for systemic anti-coagulation. 2. WPW - no symptoms and no residual evidence of ventricular pre-excitation.  3. Peripheral vascular disease - he denies claudication. 4. ETOH abuse - he is in remision but I encouraged him not to drink any ETOH as his risk of relapse is very high.  Carrell Rahmani,MD 2. HTN -his bp is much improved. I have asked him to stop his zestril. 3. Dyslipidemia - he has been on a statin. In light of his cirrhosis, I have recommended that he stop the statin. 4. Liver failure/ETOH abuse - he appears to be improving. He will continue his current meds. I strongly encouraged him to never again consume any ETOH.

## 2020-03-12 NOTE — Patient Instructions (Signed)
Medication Instructions:  Your physician recommends that you continue on your current medications as directed. Please refer to the Current Medication list given to you today.  *If you need a refill on your cardiac medications before your next appointment, please call your pharmacy*   Lab Work: NONE If you have labs (blood work) drawn today and your tests are completely normal, you will receive your results only by: Marland Kitchen MyChart Message (if you have MyChart) OR . A paper copy in the mail If you have any lab test that is abnormal or we need to change your treatment, we will call you to review the results.   Testing/Procedures: NONE   Follow-Up: At [facility], you and your health needs are our priority.  As part of our continuing mission to provide you with exceptional heart care, we have created designated Provider Care Teams.  These Care Teams include your primary Cardiologist (physician) and Advanced Practice Providers (APPs -  Physician Assistants and Nurse Practitioners) who all work together to provide you with the care you need, when you need it.  We recommend signing up for the patient portal called "MyChart".  Sign up information is provided on this After Visit Summary.  MyChart is used to connect with patients for Virtual Visits (Telemedicine).  Patients are able to view lab/test results, encounter notes, upcoming appointments, etc.  Non-urgent messages can be sent to your provider as well.   To learn more about what you can do with MyChart, go to [email or web address].    Your next appointment:   1 year(s)  The format for your next appointment:   In Person  Provider:   Cristopher Peru, MD   Other Instructions

## 2020-03-14 ENCOUNTER — Other Ambulatory Visit: Payer: Self-pay

## 2020-03-14 ENCOUNTER — Encounter: Payer: Self-pay | Admitting: Internal Medicine

## 2020-03-14 ENCOUNTER — Ambulatory Visit (INDEPENDENT_AMBULATORY_CARE_PROVIDER_SITE_OTHER): Payer: Medicare Other | Admitting: Internal Medicine

## 2020-03-14 VITALS — BP 114/68 | HR 81 | Temp 98.4°F | Resp 18 | Ht 74.0 in | Wt 242.4 lb

## 2020-03-14 DIAGNOSIS — E519 Thiamine deficiency, unspecified: Secondary | ICD-10-CM

## 2020-03-14 DIAGNOSIS — E118 Type 2 diabetes mellitus with unspecified complications: Secondary | ICD-10-CM | POA: Diagnosis not present

## 2020-03-14 DIAGNOSIS — Z794 Long term (current) use of insulin: Secondary | ICD-10-CM | POA: Diagnosis not present

## 2020-03-14 DIAGNOSIS — I480 Paroxysmal atrial fibrillation: Secondary | ICD-10-CM | POA: Diagnosis not present

## 2020-03-14 DIAGNOSIS — N1831 Chronic kidney disease, stage 3a: Secondary | ICD-10-CM

## 2020-03-14 DIAGNOSIS — K7030 Alcoholic cirrhosis of liver without ascites: Secondary | ICD-10-CM

## 2020-03-14 DIAGNOSIS — R519 Headache, unspecified: Secondary | ICD-10-CM | POA: Insufficient documentation

## 2020-03-14 DIAGNOSIS — G4485 Primary stabbing headache: Secondary | ICD-10-CM

## 2020-03-14 DIAGNOSIS — I10 Essential (primary) hypertension: Secondary | ICD-10-CM

## 2020-03-14 DIAGNOSIS — E785 Hyperlipidemia, unspecified: Secondary | ICD-10-CM

## 2020-03-14 DIAGNOSIS — M5412 Radiculopathy, cervical region: Secondary | ICD-10-CM

## 2020-03-14 LAB — CBC WITH DIFFERENTIAL/PLATELET
Basophils Absolute: 0 10*3/uL (ref 0.0–0.1)
Basophils Relative: 0.2 % (ref 0.0–3.0)
Eosinophils Absolute: 0.1 10*3/uL (ref 0.0–0.7)
Eosinophils Relative: 1 % (ref 0.0–5.0)
HCT: 45.9 % (ref 39.0–52.0)
Hemoglobin: 15.8 g/dL (ref 13.0–17.0)
Lymphocytes Relative: 52.1 % — ABNORMAL HIGH (ref 12.0–46.0)
Lymphs Abs: 4 10*3/uL (ref 0.7–4.0)
MCHC: 34.4 g/dL (ref 30.0–36.0)
MCV: 89.1 fl (ref 78.0–100.0)
Monocytes Absolute: 0.4 10*3/uL (ref 0.1–1.0)
Monocytes Relative: 5.1 % (ref 3.0–12.0)
Neutro Abs: 3.2 10*3/uL (ref 1.4–7.7)
Neutrophils Relative %: 41.6 % — ABNORMAL LOW (ref 43.0–77.0)
Platelets: 85 10*3/uL — ABNORMAL LOW (ref 150.0–400.0)
RBC: 5.15 Mil/uL (ref 4.22–5.81)
RDW: 13.7 % (ref 11.5–15.5)
WBC: 7.8 10*3/uL (ref 4.0–10.5)

## 2020-03-14 LAB — BASIC METABOLIC PANEL
BUN: 20 mg/dL (ref 6–23)
CO2: 31 mEq/L (ref 19–32)
Calcium: 9.5 mg/dL (ref 8.4–10.5)
Chloride: 102 mEq/L (ref 96–112)
Creatinine, Ser: 1.41 mg/dL (ref 0.40–1.50)
GFR: 48.79 mL/min — ABNORMAL LOW (ref >60.00)
Glucose, Bld: 120 mg/dL — ABNORMAL HIGH (ref 70–99)
Potassium: 4.6 mEq/L (ref 3.5–5.1)
Sodium: 139 mEq/L (ref 135–145)

## 2020-03-14 LAB — LIPID PANEL
Cholesterol: 186 mg/dL (ref 0–200)
HDL: 54.2 mg/dL (ref >39.00)
LDL Cholesterol: 114 mg/dL — ABNORMAL HIGH (ref 0–99)
NonHDL: 132.27
Total CHOL/HDL Ratio: 3
Triglycerides: 93 mg/dL (ref 0.0–149.0)
VLDL: 18.6 mg/dL (ref 0.0–40.0)

## 2020-03-14 LAB — HEPATIC FUNCTION PANEL
ALT: 11 U/L (ref 0–53)
AST: 16 U/L (ref 0–37)
Albumin: 4.1 g/dL (ref 3.5–5.2)
Alkaline Phosphatase: 67 U/L (ref 39–117)
Bilirubin, Direct: 0.2 mg/dL (ref 0.0–0.3)
Total Bilirubin: 1 mg/dL (ref 0.2–1.2)
Total Protein: 6.9 g/dL (ref 6.0–8.3)

## 2020-03-14 LAB — TSH: TSH: 1.92 u[IU]/mL (ref 0.35–4.50)

## 2020-03-14 LAB — PROTIME-INR
INR: 1.1 ratio — ABNORMAL HIGH (ref 0.8–1.0)
Prothrombin Time: 11.9 s (ref 9.6–13.1)

## 2020-03-14 LAB — AMMONIA: Ammonia: 31 umol/L (ref 11–35)

## 2020-03-14 LAB — HEMOGLOBIN A1C: Hgb A1c MFr Bld: 7.5 % — ABNORMAL HIGH (ref 4.6–6.5)

## 2020-03-14 NOTE — Progress Notes (Signed)
Subjective:  Patient ID: Patrick Kidd, male    DOB: January 03, 1946  Age: 75 y.o. MRN: [id number]  CC: Diabetes (Rm 2. For the last couple of weeks when he lays down he feels a tingling sensation along with a headache. He stated that he takes Advil to relieve the pain. He also mentioned that he is almost out of glucerna.) and Medication Problem (He stated that Revatio has not been working and would like to try something different. )   HPI Patrick Kidd presents for f/up -  He complains of a 2-week history of headache.  He points to his right posterior scalp.  He has gotten some symptom relief with Tylenol.  He says the headache is 3 on a scale of 10.  The headache interferes with his sleep.  He has some localized numbness and tingling in the area but he denies numbness, weakness, or tingling in his extremities.  He denies nausea, vomiting, changes in his vision or hearing.  He laments that he eats too much ice cream but thinks his blood sugar and blood pressure have been well controlled.  Outpatient Medications Prior to Visit  Medication Sig Dispense Refill  . atenolol (TENORMIN) 25 MG tablet Take one tablet as needed for heart racing/palpitations.  May take up to 2 tablets in 24 hours. 60 tablet 3  . azelastine (ASTELIN) 0.1 % nasal spray Place 2 sprays into both nostrils 2 (two) times daily. Use in each nostril as directed 90 mL 1  . blood glucose meter kit and supplies KIT Use to test blood sugar once daily. DX E11.8 1 each 0  . Blood Glucose Monitoring Suppl (FREESTYLE FREEDOM LITE) w/Device KIT Use to check blood sugars 2 times daily 1 each 1  . Colchicine (MITIGARE) 0.6 MG CAPS Take 1 tablet by mouth daily. 90 capsule 0  . dapagliflozin propanediol (FARXIGA) 10 MG TABS tablet Take 1 tablet (10 mg total) by mouth daily before breakfast. 628 tablet 0  . folic acid (FOLVITE) 1 MG tablet TAKE 1 TABLET EVERY DAY 90 tablet 1  . furosemide (LASIX) 20 MG tablet TAKE 1 TABLET(20 MG) BY MOUTH  DAILY 90 tablet 1  . gabapentin (NEURONTIN) 300 MG capsule Take 1 capsule (300 mg total) by mouth 2 (two) times daily. 180 capsule 3  . Glucagon (GVOKE HYPOPEN 2-PACK) 1 MG/0.2ML SOAJ Inject 1 Act into the skin daily as needed. 2 mL 5  . glucose blood (COOL BLOOD GLUCOSE TEST STRIPS) test strip Use to check blood sugar daily. DX: E11.8 300 each 3  . insulin glargine, 2 Unit Dial, (TOUJEO MAX SOLOSTAR) 300 UNIT/ML Solostar Pen Inject 50 Units into the skin daily. 3 mL 3  . KLOR-CON M20 20 MEQ tablet TAKE 1 TABLET BY MOUTH EVERY DAY 90 tablet 1  . lactulose (CHRONULAC) 10 GM/15ML solution TAKE 15 MLS (10 G TOTAL) BY MOUTH 2 (TWO) TIMES DAILY. 946 mL 2  . Lancets (ACCU-CHEK MULTICLIX) lancets Use to check sugar daily. DX: E11.8 100 each 12  . lisinopril (ZESTRIL) 10 MG tablet TAKE 1 TABLET EVERY DAY 90 tablet 0  . metFORMIN (GLUCOPHAGE) 500 MG tablet Take 1 tablet (500 mg total) by mouth 2 (two) times daily with a meal. 180 tablet 0  . Multiple Vitamin (MULTIVITAMIN WITH MINERALS) TABS tablet Take 1 tablet by mouth daily.    . pantoprazole (PROTONIX) 40 MG tablet Take 1 tablet (40 mg total) by mouth daily. 90 tablet 1  . rifaximin (XIFAXAN) 550 MG  TABS tablet Take 1 tablet (550 mg total) by mouth 2 (two) times daily. 180 tablet 3  . thiamine 100 MG tablet Take 1 tablet (100 mg total) by mouth every other day. 45 tablet 1  . sildenafil (REVATIO) 20 MG tablet Take 2 tablets (40 mg total) by mouth daily as needed. (Patient not taking: Reported on 03/14/2020) 40 tablet 4   No facility-administered medications prior to visit.    ROS Review of Systems  Constitutional: Positive for unexpected weight change (wt gain). Negative for appetite change, chills, diaphoresis, fatigue and fever.  HENT: Negative.   Eyes: Negative.   Respiratory: Negative for cough, chest tightness, shortness of breath and wheezing.   Cardiovascular: Negative for chest pain, palpitations and leg swelling.  Gastrointestinal:  Negative for abdominal pain, diarrhea, nausea and vomiting.  Endocrine: Negative.  Negative for polydipsia, polyphagia and polyuria.  Genitourinary: Negative.  Negative for difficulty urinating.  Musculoskeletal: Positive for neck pain. Negative for back pain.  Skin: Negative.   Neurological: Positive for numbness and headaches. Negative for dizziness, tremors, syncope, speech difficulty, weakness and light-headedness.  Hematological: Negative for adenopathy. Does not bruise/bleed easily.  Psychiatric/Behavioral: Negative.     Objective:  BP 114/68   Pulse 81   Temp 98.4 F (36.9 C) (Oral)   Resp 18   Ht _0  (1.88 m)   Wt 242 lb 6.4 oz (110 kg)   SpO2 95%   BMI 31.12 kg/m   BP Readings from Last 3 Encounters:  03/14/20 114/68  03/12/20 116/70  11/23/19 116/64    Wt Readings from Last 3 Encounters:  03/14/20 242 lb 6.4 oz (110 kg)  03/12/20 243 lb (110.2 kg)  11/23/19 239 lb (108.4 kg)    Physical Exam Constitutional:      Appearance: He is obese. He is not ill-appearing.  HENT:     Nose: Nose normal.     Mouth/Throat:     Mouth: Mucous membranes are moist.  Eyes:     General: No scleral icterus.    Extraocular Movements: Extraocular movements intact.     Conjunctiva/sclera: Conjunctivae normal.     Pupils: Pupils are equal, round, and reactive to light.  Cardiovascular:     Rate and Rhythm: Normal rate and regular rhythm.     Heart sounds: No murmur heard.   Pulmonary:     Effort: Pulmonary effort is normal.     Breath sounds: No stridor. No wheezing, rhonchi or rales.  Abdominal:     General: Abdomen is protuberant. Bowel sounds are normal. There is no distension.     Palpations: Abdomen is soft. There is no hepatomegaly, splenomegaly or mass.     Tenderness: There is no abdominal tenderness.  Musculoskeletal:     Cervical back: Normal, normal range of motion and neck supple. No edema, rigidity, tenderness or bony tenderness. No pain with movement. Normal  range of motion.  Lymphadenopathy:     Cervical: No cervical adenopathy.  Neurological:     Mental Status: He is alert.     Lab Results  Component Value Date   WBC 7.8 03/14/2020   HGB 15.8 03/14/2020   HCT 45.9 03/14/2020   PLT 85.0 (L) 03/14/2020   GLUCOSE 120 (H) 03/14/2020   CHOL 186 03/14/2020   TRIG 93.0 03/14/2020   HDL 54.20 03/14/2020   LDLCALC 114 (H) 03/14/2020   ALT 11 03/14/2020   AST 16 03/14/2020   NA 139 03/14/2020   K 4.6 03/14/2020   CL  102 03/14/2020   CREATININE 1.41 03/14/2020   BUN 20 03/14/2020   CO2 31 03/14/2020   TSH 1.92 03/14/2020   PSA 0.26 11/23/2019   INR 1.1 (H) 03/14/2020   HGBA1C 7.5 (H) 03/14/2020   MICROALBUR 3.4 (H) 03/14/2019    US Abdomen Complete  Result Date: 01/23/2020 CLINICAL DATA:  Cirrhosis.  History of ascites. EXAM: ABDOMEN ULTRASOUND COMPLETE COMPARISON:  Abdominal MRI 01/31/2019. Abdominal ultrasound 01/29/2019. FINDINGS: Gallbladder: There is echogenic debris within the gallbladder lumen which moves with changes in patient position and shows no shadowing or internal vascularity, most consistent with tumefactive sludge. No discrete gallstones or gallbladder wall thickening. Common bile duct: Diameter: 3 mm Liver: Morphologic changes of cirrhosis with contour irregularity of the liver. There is a complex cystic lesion posteriorly in the right hepatic lobe which measures 6.5 x 5.6 x 6.0 cm, similar to previous studies. No evidence of a patent neoplasm. Portal vein is patent on color Doppler imaging with normal direction of blood flow towards the liver. IVC: No abnormality visualized. Pancreas: Visualized portion unremarkable. Spleen: Measures 13.0 x 13.3 x 14.8 cm (volume = 1340 cm^3) consistent with mild splenomegaly. No focal splenic lesion identified. Right Kidney: Length: 13.2 cm. Echogenicity within normal limits. No mass or hydronephrosis visualized. Left Kidney: Length: 13.4 cm. Echogenicity within normal limits. No mass or  hydronephrosis visualized. Abdominal aorta: Atherosclerosis without evidence of aneurysm. Other findings: No significant ascites. IMPRESSION: 1. Grossly stable changes of hepatic cirrhosis without evidence of neoplasm. Stable complex cyst in the right hepatic lobe. 2. Tumefactive sludge in the gallbladder. No biliary dilatation or signs of acute inflammation. 3. Mild splenomegaly. Electronically Signed   By: Richardean Sale M.D.   On: 01/23/2020 15:29   MR Brain Wo Contrast  Result Date: 03/16/2020 CLINICAL DATA:  Nocturnal headaches. Primary stabbing headache. Headache, intracranial hemorrhage suspected. Additional history provided by scanning technologist: Patient reports nocturnal headaches for 3 weeks. EXAM: MRI HEAD WITHOUT CONTRAST TECHNIQUE: Multiplanar, multiecho pulse sequences of the brain and surrounding structures were obtained without intravenous contrast. COMPARISON:  No pertinent prior exams available for comparison. FINDINGS: Brain: Mild cerebral and cerebellar atrophy. Chronic lacunar infarcts within the bilateral basal ganglia. Mild multifocal T2/FLAIR hyperintensity within the cerebral white matter is nonspecific, but compatible with chronic small vessel ischemic disease. There is no acute infarct. No evidence of intracranial mass. No chronic intracranial blood products. No extra-axial fluid collection. No midline shift. Vascular: Expected proximal arterial flow voids. Skull and upper cervical spine: No focal marrow lesion Sinuses/Orbits: Visualized orbits show no acute finding. Moderate-sized bilateral maxillary sinus mucous retention cysts. Small volume secretions within the right sphenoid sinus. Background mild ethmoid, sphenoid and maxillary sinus mucosal thickening bilaterally. IMPRESSION: No evidence of acute intracranial abnormality. Chronic bilateral basal ganglia lacunar infarcts. Background mild generalized atrophy of the brain and chronic small vessel ischemic disease. Paranasal  sinus disease, as described. Electronically Signed   By: Kellie Simmering DO   On: 03/16/2020 12:20    Assessment & Plan:   Patrick Kidd was seen today for diabetes and medication problem.  Diagnoses and all orders for this visit:  Essential hypertension- His blood pressure is adequately well controlled. -     CBC with Differential/Platelet; Future -     Basic metabolic panel; Future -     Basic metabolic panel -     CBC with Differential/Platelet  Paroxysmal atrial fibrillation ([place])- He is maintaining sinus rhythm.  Type 2 diabetes mellitus with complication, with long-term current use  of insulin (Patrick Kidd)- His A1c is at 7.5%.  His blood sugars are adequately well controlled. -     Basic metabolic panel; Future -     Hemoglobin A1c; Future -     Hemoglobin A1c -     Basic metabolic panel  Hyperlipidemia LDL goal <100- He has achieved his LDL goal and is doing well on the statin. -     Lipid panel; Future -     TSH; Future -     Hepatic function panel; Future -     Hepatic function panel -     TSH -     Lipid panel  Thiamine deficiency- I will monitor his thiamine level. -     CBC with Differential/Platelet; Future -     Vitamin B1; Future -     Vitamin B1 -     CBC with Differential/Platelet  Alcoholic cirrhosis of liver without ascites (Patrick Kidd)- His MELD score is 11.  He is well compensated. -     Ammonia; Future -     Protime-INR; Future -     Protime-INR -     Ammonia  Nocturnal headaches- His MRI is unremarkable.  I am concerned the pain may be coming from his cervical spine so I have asked him to see sports medicine. -     MR Brain Wo Contrast; Future  Primary stabbing headache -     MR Brain Wo Contrast; Future  Stage 3a chronic kidney disease (HCC)-his renal function is stable.  Cervical radiculopathy   I am having Patrick Kidd "Patrick Kidd" maintain his FreeStyle Freedom Lite, blood glucose meter kit and supplies, accu-chek multiclix, multivitamin with minerals,  atenolol, pantoprazole, rifaximin, Cool Blood Glucose Test Strips, azelastine, Colchicine, sildenafil, thiamine, gabapentin, folic acid, lisinopril, lactulose, Toujeo Max SoloStar, dapagliflozin propanediol, metFORMIN, Gvoke HypoPen 2-Pack, Klor-Con M20, and furosemide.  No orders of the defined types were placed in this encounter.    Follow-up: Return in about 3 weeks (around 04/04/2020).  Scarlette Calico, MD

## 2020-03-14 NOTE — Patient Instructions (Signed)
General Headache Without Cause A headache is pain or discomfort felt around the head or neck area. The specific cause of a headache may not be found. There are many causes and types of headaches. A few common ones are:  Tension headaches.  Migraine headaches.  Cluster headaches.  Chronic daily headaches. Follow these instructions at home: Watch your condition for any changes. Let your health care provider know about them. Take these steps to help with your condition: Managing pain  Take over-the-counter and prescription medicines only as told by your health care provider.  Lie down in a dark, quiet room when you have a headache.  If directed, put ice on your head and neck area: ? Put ice in a plastic bag. ? Place a towel between your skin and the bag. ? Leave the ice on for 20 minutes, 2-3 times per day.  If directed, apply heat to the affected area. Use the heat source that your health care provider recommends, such as a moist heat pack or a heating pad. ? Place a towel between your skin and the heat source. ? Leave the heat on for 20-30 minutes. ? Remove the heat if your skin turns bright red. This is especially important if you are unable to feel pain, heat, or cold. You may have a greater risk of getting burned.  Keep lights dim if bright lights bother you or make your headaches worse.      Eating and drinking  Eat meals on a regular schedule.  If you drink alcohol: ? Limit how much you use to:  0-1 drink a day for women.  0-2 drinks a day for men. ? Be aware of how much alcohol is in your drink. In the [place] one drink equals one 12 oz bottle of beer (355 mL), one 5 oz glass of wine (148 mL), or one 1 oz glass of hard liquor (44 mL).  Stop drinking caffeine, or decrease the amount of caffeine you drink. General instructions  Keep a headache journal to help find out what may trigger your headaches. For example, write down: ? What you eat and drink. ? How much sleep  you get. ? Any change to your diet or medicines.  Try massage or other relaxation techniques.  Limit stress.  Sit up straight, and do not tense your muscles.  Do not use any products that contain nicotine or tobacco, such as cigarettes, e-cigarettes, and chewing tobacco. If you need help quitting, ask your health care provider.  Exercise regularly as told by your health care provider.  Sleep on a regular schedule. Get 7-9 hours of sleep each night, or the amount recommended by your health care provider.  Keep all follow-up visits as told by your health care provider. This is important.   Contact a health care provider if:  Your symptoms are not helped by medicine.  You have a headache that is different from the usual headache.  You have nausea or you vomit.  You have a fever. Get help right away if:  Your headache becomes severe quickly.  Your headache gets worse after moderate to intense physical activity.  You have repeated vomiting.  You have a stiff neck.  You have a loss of vision.  You have problems with speech.  You have pain in the eye or ear.  You have muscular weakness or loss of muscle control.  You lose your balance or have trouble walking.  You feel faint or pass out.  You have  confusion.  You have a seizure. Summary  A headache is pain or discomfort felt around the head or neck area.  There are many causes and types of headaches. In some cases, the cause may not be found.  Keep a headache journal to help find out what may trigger your headaches. Watch your condition for any changes. Let your health care provider know about them.  Contact a health care provider if you have a headache that is different from the usual headache, or if your symptoms are not helped by medicine.  Get help right away if your headache becomes severe, you vomit, you have a loss of vision, you lose your balance, or you have a seizure. This information is not intended to  replace advice given to you by your health care provider. Make sure you discuss any questions you have with your health care provider. Document Revised: 08/10/2017 Document Reviewed: 08/10/2017 Elsevier Patient Education  [place].

## 2020-03-15 DIAGNOSIS — N1831 Chronic kidney disease, stage 3a: Secondary | ICD-10-CM | POA: Insufficient documentation

## 2020-03-15 DIAGNOSIS — G4485 Primary stabbing headache: Secondary | ICD-10-CM | POA: Insufficient documentation

## 2020-03-16 ENCOUNTER — Telehealth: Payer: Self-pay | Admitting: Internal Medicine

## 2020-03-16 ENCOUNTER — Ambulatory Visit
Admission: RE | Admit: 2020-03-16 | Discharge: 2020-03-16 | Disposition: A | Discharge status: Home / Self Care | Payer: Medicare Other | Source: Ambulatory Visit | Attending: Internal Medicine | Admitting: Internal Medicine

## 2020-03-16 ENCOUNTER — Other Ambulatory Visit: Payer: Self-pay

## 2020-03-16 DIAGNOSIS — I6381 Other cerebral infarction due to occlusion or stenosis of small artery: Secondary | ICD-10-CM | POA: Diagnosis not present

## 2020-03-16 DIAGNOSIS — G319 Degenerative disease of nervous system, unspecified: Secondary | ICD-10-CM | POA: Diagnosis not present

## 2020-03-16 DIAGNOSIS — G4485 Primary stabbing headache: Secondary | ICD-10-CM | POA: Diagnosis not present

## 2020-03-16 DIAGNOSIS — I6782 Cerebral ischemia: Secondary | ICD-10-CM | POA: Diagnosis not present

## 2020-03-16 DIAGNOSIS — R519 Headache, unspecified: Secondary | ICD-10-CM

## 2020-03-16 NOTE — Telephone Encounter (Signed)
Patient saw his scan results and now is wondering what needs to be done about his headaches.

## 2020-03-18 NOTE — Telephone Encounter (Signed)
I have ordered a referral to have his neck evaluated

## 2020-03-19 LAB — VITAMIN B1: Vitamin B1 (Thiamine): 13 nmol/L (ref 8–30)

## 2020-03-19 NOTE — Telephone Encounter (Signed)
I would have to defer to the judgement of his PCP that this is not an infection. He can do zyrtec (cetirizine) and/or flonase over the counter to help with sinuses or visit with another provider to assess for infection.

## 2020-03-19 NOTE — Telephone Encounter (Signed)
Called pt, LVM.   

## 2020-03-19 NOTE — Telephone Encounter (Signed)
Pt stated that he is 100% sure that it is an issue with his sinus cavity. He doesn't believe that the issue is originating in the neck and declined the referral placed by PCP. I have informed the pt that PCP is out of office this week and would like for another provider to send in an abx to take care of his sinus issues. Please advise.

## 2020-03-19 NOTE — Telephone Encounter (Signed)
Called pt, LVM to discuss.  

## 2020-03-20 ENCOUNTER — Other Ambulatory Visit: Payer: Self-pay

## 2020-03-20 ENCOUNTER — Telehealth (INDEPENDENT_AMBULATORY_CARE_PROVIDER_SITE_OTHER): Payer: Medicare Other | Admitting: Family

## 2020-03-20 DIAGNOSIS — N529 Male erectile dysfunction, unspecified: Secondary | ICD-10-CM | POA: Diagnosis not present

## 2020-03-20 DIAGNOSIS — J019 Acute sinusitis, unspecified: Secondary | ICD-10-CM

## 2020-03-20 DIAGNOSIS — J341 Cyst and mucocele of nose and nasal sinus: Secondary | ICD-10-CM

## 2020-03-20 MED ORDER — AMOXICILLIN-POT CLAVULANATE 875-125 MG PO TABS
1.0000 | ORAL_TABLET | Freq: Two times a day (BID) | ORAL | 0 refills | Status: DC
Start: 2020-03-20 — End: 2020-03-28

## 2020-03-20 MED ORDER — FLUTICASONE PROPIONATE 50 MCG/ACT NA SUSP: 2.0000 | Freq: Every day | NASAL | 1 refills | Status: DC

## 2020-03-20 NOTE — Progress Notes (Signed)
Patrick Kidd is a 75 y.o. male with the following history as recorded in EpicCare:  Patient Active Problem List   Diagnosis Date Noted  . Primary stabbing headache 03/15/2020  . Stage 3a chronic kidney disease ([place]) 03/15/2020  . Nocturnal headaches 03/14/2020  . Thiamine deficiency   . Paroxysmal atrial fibrillation ([place]) 07/19/2019  . Allergic rhinitis 06/09/2019  . Erectile dysfunction due to diabetes mellitus ([place]) 06/09/2019  . Overweight with body mass index (BMI) of 28 to 28.9 in adult 03/17/2019  . WPW (Wolff-Parkinson-White syndrome) 02/05/2019  . SBP (spontaneous bacterial peritonitis) (HCC)-December 2020, treated on suppressive therapy   . Encephalopathy, hepatic ([place])   . Degenerative disc disease, cervical 07/21/2018  . Cervical radiculopathy 07/16/2018  . OAB (overactive bladder) 07/06/2018  . Malignant neoplasm of prostate ([place]) 01/13/2018  . Hyperlipidemia LDL goal <100 11/11/2017  . Essential hypertension 11/11/2017  . BPH associated with nocturia 11/11/2017  . Erectile dysfunction due to arterial insufficiency 04/07/2017  . Peripheral vascular disease ([place]) 06/25/2016  . Alcoholic cirrhosis ([place]) 50/35/4656  . Type 2 diabetes mellitus with complication, with long-term current use of insulin ([place]) 08/22/2015  . Hypersomnolence 03/19/2014  . Peripheral neuropathy 01/11/2013  . Polyclonal gammopathy 01/11/2013  . GERD 10/04/2009    Current Outpatient Medications  Medication Sig Dispense Refill  . amoxicillin-clavulanate (AUGMENTIN) 875-125 MG tablet Take 1 tablet by mouth 2 (two) times daily for 10 days. 20 tablet 0  . fluticasone (FLONASE) 50 MCG/ACT nasal spray Place 2 sprays into both nostrils daily. 16 g 1  . sildenafil (REVATIO) 20 MG tablet Take 2 tablets (40 mg total) by mouth daily as needed. 40 tablet 4  . atenolol (TENORMIN) 25 MG tablet Take one tablet as needed for heart racing/palpitations.  May take up to 2 tablets in 24 hours. 60 tablet 3  .  azelastine (ASTELIN) 0.1 % nasal spray Place 2 sprays into both nostrils 2 (two) times daily. Use in each nostril as directed 90 mL 1  . blood glucose meter kit and supplies KIT Use to test blood sugar once daily. DX E11.8 1 each 0  . Blood Glucose Monitoring Suppl (FREESTYLE FREEDOM LITE) w/Device KIT Use to check blood sugars 2 times daily 1 each 1  . Colchicine (MITIGARE) 0.6 MG CAPS Take 1 tablet by mouth daily. 90 capsule 0  . dapagliflozin propanediol (FARXIGA) 10 MG TABS tablet Take 1 tablet (10 mg total) by mouth daily before breakfast. 812 tablet 0  . folic acid (FOLVITE) 1 MG tablet TAKE 1 TABLET EVERY DAY 90 tablet 1  . furosemide (LASIX) 20 MG tablet TAKE 1 TABLET(20 MG) BY MOUTH DAILY 90 tablet 1  . gabapentin (NEURONTIN) 300 MG capsule Take 1 capsule (300 mg total) by mouth 2 (two) times daily. 180 capsule 3  . Glucagon (GVOKE HYPOPEN 2-PACK) 1 MG/0.2ML SOAJ Inject 1 Act into the skin daily as needed. 2 mL 5  . glucose blood (COOL BLOOD GLUCOSE TEST STRIPS) test strip Use to check blood sugar daily. DX: E11.8 300 each 3  . insulin glargine, 2 Unit Dial, (TOUJEO MAX SOLOSTAR) 300 UNIT/ML Solostar Pen Inject 50 Units into the skin daily. 3 mL 3  . KLOR-CON M20 20 MEQ tablet TAKE 1 TABLET BY MOUTH EVERY DAY 90 tablet 1  . lactulose (CHRONULAC) 10 GM/15ML solution TAKE 15 MLS (10 G TOTAL) BY MOUTH 2 (TWO) TIMES DAILY. 946 mL 2  . Lancets (ACCU-CHEK MULTICLIX) lancets Use to check sugar daily. DX: E11.8 100 each  12  . lisinopril (ZESTRIL) 10 MG tablet TAKE 1 TABLET EVERY DAY 90 tablet 0  . metFORMIN (GLUCOPHAGE) 500 MG tablet Take 1 tablet (500 mg total) by mouth 2 (two) times daily with a meal. 180 tablet 0  . Multiple Vitamin (MULTIVITAMIN WITH MINERALS) TABS tablet Take 1 tablet by mouth daily.    . pantoprazole (PROTONIX) 40 MG tablet Take 1 tablet (40 mg total) by mouth daily. 90 tablet 1  . rifaximin (XIFAXAN) 550 MG TABS tablet Take 1 tablet (550 mg total) by mouth 2 (two) times  daily. 180 tablet 3  . thiamine 100 MG tablet Take 1 tablet (100 mg total) by mouth every other day. 45 tablet 1   No current facility-administered medications for this visit.    Allergies: Beta adrenergic blockers and Calcium channel blockers  Past Medical History:  Diagnosis Date  . Acrophobia   . Alcoholic cirrhosis ([place]) 8/46/6599  . Anemia   . Arthritis    foot by big toe  . BPH associated with nocturia   . Cataract    removed both eyes  . Chronic cough    PMH of  . COVID-19 12/2018  . Diabetes mellitus without complication ([place])   . Diverticulosis 07-03-2010   Colonoscopy.   . Duodenal ulcer 2017  . Fallen arches    Bilateral  . GERD (gastroesophageal reflux disease)   . Gout   . Granuloma annulare   . Hx of adenomatous colonic polyps multiple  . Hydrocele 2011   Large septated right hydrocele  . Liver cyst   . Liver lesion   . Nonspecific elevation of levels of transaminase or lactic acid dehydrogenase (LDH)   . Obesity   . Peripheral neuropathy   . Plantar fasciitis    PMH of  . Portal hypertension ([place]) 2017  . Prostate cancer ([place])   . Right shoulder pain 11/2017  . Sleep apnea    no cpap, patient denies  . Thiamine deficiency   . Wears reading eyeglasses   . WPW (Wolff-Parkinson-White syndrome) 02/05/2019    Past Surgical History:  Procedure Laterality Date  . CATARACT EXTRACTION, BILATERAL  12/2011    Dr Gershon Crane  . COLONOSCOPY  2017  . COLONOSCOPY W/ POLYPECTOMY  07/03/2010   2 adenomas, diverticulosis on right. Dr [name]  . CYSTOSCOPY N/A 03/15/2018   Procedure: CYSTOSCOPY FLEXIBLE;  Surgeon: [name], MD;  Location: [facility];  Service: Urology;  Laterality: N/A;  NO SEEDS FOUND IN BLADDER  . ESOPHAGOGASTRODUODENOSCOPY (EGD) WITH PROPOFOL N/A 01/08/2016   Procedure: ESOPHAGOGASTRODUODENOSCOPY (EGD) WITH PROPOFOL;  Surgeon: [name], MD;  Location: [facility];  Service: Endoscopy;  Laterality: N/A;  . FLEXIBLE  SIGMOIDOSCOPY  2000  . RADIOACTIVE SEED IMPLANT N/A 03/15/2018   Procedure: RADIOACTIVE SEED IMPLANT/BRACHYTHERAPY IMPLANT;  Surgeon: [name], MD;  Location: [facility];  Service: Urology;  Laterality: N/A;   73 SEEDS IMPLANTED  . SIGMOIDOSCOPY    . SPACE OAR INSTILLATION N/A 03/15/2018   Procedure: SPACE OAR INSTILLATION;  Surgeon: [name], MD;  Location: [facility];  Service: Urology;  Laterality: N/A;  . WISDOM TOOTH EXTRACTION      Family History  Problem Relation Age of Onset  . Lung cancer Mother        smoker  . Leukemia Father        Acute myelocytic  . Diabetes Neg Hx   . Stroke Neg Hx   .  Heart disease Neg Hx   . Colon cancer Neg Hx   . Colon polyps Neg Hx   . Esophageal cancer Neg Hx   . Rectal cancer Neg Hx   . Stomach cancer Neg Hx     Social History   Tobacco Use  . Smoking status: Former Smoker    Packs/day: 2.00    Years: 6.00    Pack years: 12.00    Types: Cigarettes    Quit date: 02/04/1971    Years since quitting: 49.1  . Smokeless tobacco: Never Used  . Tobacco comment: smoked age [age], up to 2 ppd  Substance Use Topics  . Alcohol use: Not Currently    Alcohol/week: 28.0 standard drinks    Types: 28 Shots of liquor per week    Comment: No alochol since Christmas Day 2020    Subjective:   I connected with Darron Doom on 03/20/20 at 11:40 AM EST by a video enabled telemedicine application and verified that I am speaking with the correct person using two identifiers.   I discussed the limitations of evaluation and management by telemedicine and the availability of in person appointments. The patient expressed understanding and agreed to proceed. Provider in office/ patient is at home; provider and patient are only 2 people on video call.   Patient has been having increased problems with headaches. Saw his PCP last week with concerns and had MRI done; no emergent findings- did show some sinus  cysts/ sinus disease. Patient is concerned he has a sinus infections- having to sit up at night to sleep due to drainage being so problematic; no OTC medications at this time.     Objective:  There were no vitals filed for this visit.  General: Well developed, well nourished, in no acute distress  Head: Normocephalic and atraumatic  Lungs: Respirations unlabored;  Neurologic: Alert and oriented; speech intact; face symmetrical; moves all extremities well; CNII-XII intact without focal deficit   Assessment:  1. Acute sinusitis, recurrence not specified, unspecified location   2. Cyst of nasal sinus   3. Erectile dysfunction, unspecified erectile dysfunction type     Plan:  1. Reviewed MRI done last week; will go ahead and start Augmentin and Flonase; follow-up if no improvement; 2. Refer to ENT; 3. Can try taking 5 tablets of Sildenafil for 100 mg; if no improvement, reach out to his PCP to discuss other options; may need to consider Cialis;  No follow-ups on file.  Orders Placed This Encounter  Procedures  . Ambulatory referral to ENT    Referral Priority:   Routine    Referral Type:   Consultation    Referral Reason:   Specialty Services Required    Requested Specialty:   Otolaryngology    Number of Visits Requested:   1    Requested Prescriptions   Signed Prescriptions Disp Refills  . amoxicillin-clavulanate (AUGMENTIN) 875-125 MG tablet 20 tablet 0    Sig: Take 1 tablet by mouth 2 (two) times daily for 10 days.  . fluticasone (FLONASE) 50 MCG/ACT nasal spray 16 g 1    Sig: Place 2 sprays into both nostrils daily.

## 2020-03-28 ENCOUNTER — Telehealth: Payer: Self-pay | Admitting: Pharmacist

## 2020-03-28 ENCOUNTER — Other Ambulatory Visit: Payer: Self-pay | Admitting: Internal Medicine

## 2020-03-28 ENCOUNTER — Telehealth: Payer: Self-pay | Admitting: Internal Medicine

## 2020-03-28 DIAGNOSIS — N5201 Erectile dysfunction due to arterial insufficiency: Secondary | ICD-10-CM

## 2020-03-28 DIAGNOSIS — E118 Type 2 diabetes mellitus with unspecified complications: Secondary | ICD-10-CM

## 2020-03-28 DIAGNOSIS — Z794 Long term (current) use of insulin: Secondary | ICD-10-CM

## 2020-03-28 MED ORDER — TADALAFIL 20 MG PO TABS: 20.0000 mg | ORAL_TABLET | Freq: Every day | ORAL | 3 refills | Status: DC | PRN

## 2020-03-28 NOTE — Progress Notes (Signed)
   Called was made to [employer] to check the status of patient assistant application for [place]. Spoke with representative Alvie Heidelberg) who states that patient application needs to be re-faxed along with proof of income to [phone].    Wendy Poet, Clinical Pharmacist Assistant [facility] [phone]   Time spent:30 with representative

## 2020-03-28 NOTE — Telephone Encounter (Signed)
Patient called and was wondering if a stronger ED medication had been sent to the pharmacy. Please advise

## 2020-03-30 NOTE — Telephone Encounter (Signed)
Contacted patient to relay info. Pt will bring proof of income for himsef and his wife to the office at his earliest convenience.

## 2020-04-03 ENCOUNTER — Other Ambulatory Visit: Payer: Self-pay | Admitting: Internal Medicine

## 2020-04-04 ENCOUNTER — Telehealth: Payer: Self-pay | Admitting: Pharmacist

## 2020-04-04 NOTE — Progress Notes (Signed)
A call was made today to [employer] to check on the status of Mr. Osorto patient assistance application. Spoke with representative Kennyth Lose) and told her that the application has been faex over twice, once on 03/21/20 and then again on 04/02/20. The rep stated that she has application for Mr. Stcyr and will start the process on that application. She stated that a fax of approval or denial will be sent to the Dr. Ronnald Ramp office.   Wendy Poet, [place] [phone]   Time spent:30

## 2020-04-10 ENCOUNTER — Telehealth: Payer: Self-pay | Admitting: Pharmacist

## 2020-04-10 NOTE — Progress Notes (Addendum)
A call was made to [employer] to check the status of Patrick Kidd's patient assistance application for [place]. I spoke with the representative Altha Harm) who stated that the application has been approved through 02/02/21.   Wendy Poet, [place] [phone]

## 2020-04-11 ENCOUNTER — Other Ambulatory Visit: Payer: Self-pay | Admitting: Family

## 2020-04-19 ENCOUNTER — Ambulatory Visit (INDEPENDENT_AMBULATORY_CARE_PROVIDER_SITE_OTHER): Payer: Medicare Other | Admitting: Pharmacist

## 2020-04-19 ENCOUNTER — Other Ambulatory Visit: Payer: Self-pay

## 2020-04-19 DIAGNOSIS — E118 Type 2 diabetes mellitus with unspecified complications: Secondary | ICD-10-CM | POA: Diagnosis not present

## 2020-04-19 DIAGNOSIS — E785 Hyperlipidemia, unspecified: Secondary | ICD-10-CM | POA: Diagnosis not present

## 2020-04-19 DIAGNOSIS — Z794 Long term (current) use of insulin: Secondary | ICD-10-CM

## 2020-04-19 DIAGNOSIS — K7030 Alcoholic cirrhosis of liver without ascites: Secondary | ICD-10-CM

## 2020-04-19 NOTE — Progress Notes (Signed)
Chronic Care Management Pharmacy Note  04/19/2020 Name:  Patrick Kidd MRN:  [id number] DOB:  November 10, 1945  Subjective: Patrick Kidd is an 75 y.o. year old male who is a primary patient of [name], MD.  The CCM team was consulted for assistance with disease management and care coordination needs.    Engaged with patient by telephone for follow up visit in response to Patrick Kidd referral for pharmacy case management and/or care coordination services.   Consent to Services:  The patient was given information about Chronic Care Management services, agreed to services, and gave verbal consent prior to initiation of services.  Please see initial visit note for detailed documentation.   Patient Care Team: [name], MD as PCP - General (Internal Medicine) Lorretta Harp, MD as PCP - Cardiology (Cardiology) Pieter Partridge, DO as Consulting Physician (Neurology) Charlton Haws, [facility] as Pharmacist (Pharmacist)  Recent office visits: 03/20/20 NP Patrick Kidd VV: sinus infection, rx'd Augmentin and Flonase. PCP sent Cialis following week for ED.  03/14/20 Dr Patrick Kidd OV: CPE. A1c down to 7.5%. GFR declined. C/o headaches, ordered MRI which was normal.  11/23/19 Dr Patrick Kidd OV: CPE. A1c up to 8.4%. Started Wilder Glade, Kirke Shaggy. Increased Toujeo to 50 units.  Recent consult visits: 03/12/20 Dr Patrick Kidd (cardiology): f/u Afib, cirrhosis. Not a candidate for anticoagulation d/t cirrhosis. No Afb symptoms since hospitalization  08/26/19 Dr Patrick Kidd (neurology): diabetic neuropathy, gabapentin refilled.  07/19/19 Dr Patrick Kidd (cardiology): stopped lisinopril due to good BP control, stopped statin due to cirrhosis.  Hospital visits: None in previous 6 months  Objective:  Lab Results  Component Value Date   CREATININE 1.41 03/14/2020   BUN 20 03/14/2020   GFR 48.79 (L) 03/14/2020   GFRNONAA >60 02/06/2019   GFRAA >60 02/06/2019   NA 139 03/14/2020   K 4.6 03/14/2020   CALCIUM 9.5  03/14/2020   CO2 31 03/14/2020   GLUCOSE 120 (H) 03/14/2020    Lab Results  Component Value Date/Time   HGBA1C 7.5 (H) 03/14/2020 01:48 PM   HGBA1C 8.4 (H) 11/16/2019 11:35 AM   GFR 48.79 (L) 03/14/2020 01:48 PM   GFR 63.88 11/16/2019 11:36 AM   MICROALBUR 3.4 (H) 03/14/2019 10:36 AM   MICROALBUR 1.3 10/10/2016 08:29 AM    Last diabetic Eye exam:  Lab Results  Component Value Date/Time   HMDIABEYEEXA No Retinopathy 04/13/2019 12:00 AM    Last diabetic Foot exam: No results found for: HMDIABFOOTEX   Lab Results  Component Value Date   CHOL 186 03/14/2020   HDL 54.20 03/14/2020   LDLCALC 114 (H) 03/14/2020   TRIG 93.0 03/14/2020   CHOLHDL 3 03/14/2020    Hepatic Function Latest Ref Rng & Units 03/14/2020 11/16/2019 06/09/2019  Total Protein 6.0 - 8.3 g/dL 6.9 7.0 6.8  Albumin 3.5 - 5.2 g/dL 4.1 4.2 4.0  AST 0 - 37 U/L 16 16 17   ALT 0 - 53 U/L 11 12 12   Alk Phosphatase 39 - 117 U/L 67 76 59  Total Bilirubin 0.2 - 1.2 mg/dL 1.0 0.9 1.0  Bilirubin, Direct 0.0 - 0.3 mg/dL 0.2 - 0.2    Lab Results  Component Value Date/Time   TSH 1.92 03/14/2020 01:48 PM   TSH 1.830 01/29/2019 05:00 AM   TSH 1.63 11/11/2017 11:48 AM   FREET4 1.08 03/10/2014 02:07 PM    CBC Latest Ref Rng & Units 03/14/2020 11/16/2019 06/09/2019  WBC 4.0 - 10.5 K/uL 7.8 7.2 6.9  Hemoglobin 13.0 -  17.0 g/dL 15.8 14.4 12.9(L)  Hematocrit 39.0 - 52.0 % 45.9 41.4 37.2(L)  Platelets 150.0 - 400.0 K/uL 85.0(L) 89.0(L) 99.0(L)   No results found for: VD25OH  Clinical ASCVD: No  The 10-year ASCVD risk score Mikey Bussing DC Jr., et al., 2013) is: 39.1%   Values used to calculate the score:     Age: 15 years     Sex: Male     Is Non-Hispanic African American: No     Diabetic: Yes     Tobacco smoker: No     Systolic Blood Pressure: 258 mmHg     Is BP treated: Yes     HDL Cholesterol: 54.2 mg/dL     Total Cholesterol: 186 mg/dL    Depression screen [facility] 2/9 04/14/2019 03/14/2019 10/08/2017  Decreased Interest 0 0 0   Down, Depressed, Hopeless 0 0 0  PHQ - 2 Score 0 0 0  Some recent data might be hidden     Social History   Tobacco Use  Smoking Status Former Smoker  . Packs/day: 2.00  . Years: 6.00  . Pack years: 12.00  . Types: Cigarettes  . Quit date: 02/04/1971  . Years since quitting: 49.2  Smokeless Tobacco Never Used  Tobacco Comment   smoked age [age], up to 2 ppd   BP Readings from Last 3 Encounters:  03/14/20 114/68  03/12/20 116/70  11/23/19 116/64   Pulse Readings from Last 3 Encounters:  03/14/20 81  03/12/20 79  11/23/19 83   Wt Readings from Last 3 Encounters:  03/14/20 242 lb 6.4 oz (110 kg)  03/12/20 243 lb (110.2 kg)  11/23/19 239 lb (108.4 kg)   BMI Readings from Last 3 Encounters:  03/14/20 31.12 kg/m  03/12/20 31.20 kg/m  11/23/19 30.69 kg/m    Assessment/Interventions: Review of patient past medical history, allergies, medications, health status, including review of consultants reports, laboratory and other test data, was performed as part of comprehensive evaluation and provision of chronic care management services.   SDOH:  (Social Determinants of Health) assessments and interventions performed: Yes SDOH Interventions   Flowsheet Row Most Recent Value  SDOH Interventions   Financial Strain Interventions Other (Comment)  [Farxiga PAP approved. [place] PAP]      CCM Care Plan  Allergies  Allergen Reactions  . Rifaximin Nausea And Vomiting    Required EMS visit, although patient did not go to hospital  . 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  Use with caution  . Calcium Channel Blockers     Likely WPW.  Use with caution.    Medications Reviewed Today    Reviewed by Charlton Haws, [facility] (Pharmacist) on 04/19/20 at 1610  Med List Status: <None>  Medication Order Taking? Sig Documenting Belladonna Lubinski Last Dose Status Informant  azelastine (ASTELIN) 0.1 % nasal spray [id number] No Place 2 sprays into both nostrils 2 (two) times daily. Use  in each nostril as directed  Patient not taking: Reported on 04/19/2020   [name], MD Not Taking Active            Med Note Elveria Royals Mar 12, 2020 12:06 PM)    blood glucose meter kit and supplies KIT [id number] Yes Use to test blood sugar once daily. DX E11.8 [name], MD Taking Active Self  Blood Glucose Monitoring Suppl (FREESTYLE FREEDOM LITE) w/Device KIT [id number] Yes Use to check blood sugars 2 times daily [name], FNP Taking Active  Self  Colchicine (MITIGARE) 0.6 MG CAPS [id number] Yes Take 1 tablet by mouth daily. [name], MD Taking Active            Med Note Forde Dandy, MELISSA I   Wed Nov 16, 2019 10:47 AM) PRN  dapagliflozin propanediol (FARXIGA) 10 MG TABS tablet [id number] Yes Take 1 tablet (10 mg total) by mouth daily before breakfast. [name], MD Taking Active   fluticasone [facility]) 50 MCG/ACT nasal spray [id number] Yes SPRAY 2 SPRAYS INTO EACH NOSTRIL EVERY DAY [name], MD Taking Active   folic acid (FOLVITE) 1 MG tablet [id number] Yes TAKE 1 TABLET EVERY DAY [name], MD Taking Active   furosemide (LASIX) 20 MG tablet [id number] Yes TAKE 1 TABLET(20 MG) BY MOUTH DAILY [name], MD Taking Active   gabapentin (NEURONTIN) 300 MG capsule [id number] Yes Take 1 capsule (300 mg total) by mouth 2 (two) times daily. Pieter Partridge, DO Taking Active   Glucagon (GVOKE HYPOPEN 2-PACK) 1 MG/0.2ML SOAJ [id number] No Inject 1 Act into the skin daily as needed.  Patient not taking: Reported on 04/19/2020   [name], MD Not Taking Active   glucose blood (COOL BLOOD GLUCOSE TEST STRIPS) test strip [id number] Yes Use to check blood sugar daily. DX: E11.8 [name], MD Taking Active   insulin glargine, 2 Unit Dial, (TOUJEO MAX SOLOSTAR) 300 UNIT/ML Solostar Pen [id number] Yes Inject 50 Units into the skin daily. [name], MD Taking Active   KLOR-CON M20 20 MEQ tablet [id number] Yes TAKE 1 TABLET BY MOUTH EVERY DAY [name], MD Taking Active   lactulose (CHRONULAC) 10 GM/15ML solution [id number] No TAKE 15 MLS (10 G TOTAL) BY MOUTH 2 (TWO) TIMES DAILY.  Patient not taking: Reported on 04/19/2020   Gatha Mayer, MD Not Taking Active   Lancets [facility] MULTICLIX) lancets [id number] Yes Use to check sugar daily. DX: E11.8 [name], MD Taking Active Self       Patient not taking:      Discontinued 04/19/20 1609 (Discontinued by Jayan Raymundo)            Med Note Rolan Bucco Apr 19, 2020  4:09 PM) Dc'd by cardiologist 07/2019  metFORMIN (GLUCOPHAGE) 500 MG tablet [id number] Yes TAKE 1 TABLET BY MOUTH 2 TIMES DAILY WITH A MEAL. [name], MD Taking Active   Multiple Vitamin (MULTIVITAMIN WITH MINERALS) TABS tablet [id number] Yes Take 1 tablet by mouth daily. Shelly Coss, MD Taking Active   pantoprazole (PROTONIX) 40 MG tablet [id number] Yes TAKE 1 TABLET(40 MG) BY MOUTH TWICE DAILY [name], MD Taking Active        Patient not taking:      Discontinued 04/19/20 1610 (Side effect (s))   tadalafil (CIALIS) 20 MG tablet [id number] Yes Take 1 tablet (20 mg total) by mouth daily as needed for erectile dysfunction. [name], MD Taking Active   thiamine 100 MG tablet [id number] Yes Take 1 tablet (100 mg total) by mouth every other day. [name], MD Taking Active           Patient Active Problem List   Diagnosis Date Noted  . Primary stabbing headache 03/15/2020  . Stage 3a chronic kidney disease ([place]) 03/15/2020  . Nocturnal headaches 03/14/2020  . Thiamine deficiency   . Paroxysmal atrial fibrillation ([place]) 07/19/2019  . Allergic rhinitis 06/09/2019  . Erectile dysfunction due to diabetes mellitus ([place])  06/09/2019  . Overweight with body mass index (BMI) of 28 to 28.9 in adult 03/17/2019  . WPW (Wolff-Parkinson-White syndrome) 02/05/2019  . SBP (spontaneous bacterial peritonitis) (HCC)-December 2020, treated on suppressive therapy   . Encephalopathy, hepatic  ([place])   . Degenerative disc disease, cervical 07/21/2018  . Cervical radiculopathy 07/16/2018  . OAB (overactive bladder) 07/06/2018  . Malignant neoplasm of prostate ([place]) 01/13/2018  . Hyperlipidemia LDL goal <100 11/11/2017  . Essential hypertension 11/11/2017  . BPH associated with nocturia 11/11/2017  . Erectile dysfunction due to arterial insufficiency 04/07/2017  . Peripheral vascular disease ([place]) 06/25/2016  . Alcoholic cirrhosis ([place]) 51/76/1607  . Type 2 diabetes mellitus with complication, with long-term current use of insulin ([place]) 08/22/2015  . Hypersomnolence 03/19/2014  . Peripheral neuropathy 01/11/2013  . Polyclonal gammopathy 01/11/2013  . GERD 10/04/2009    Immunization History  Administered Date(s) Administered  . Fluad Quad(high Dose 65+) 01/30/2019, 11/23/2019  . Hep A / Hep B 11/02/2015  . Influenza, High Dose Seasonal PF 10/28/2016, 11/11/2017  . Influenza,inj,Quad PF,6+ Mos 01/10/2016  . Moderna SARS-COV2 Booster Vaccination 12/13/2019  . Moderna Sars-Covid-2 Vaccination 02/13/2019, 03/16/2019  . Pneumococcal Conjugate-13 09/04/2015  . Pneumococcal Polysaccharide-23 06/05/2011, 03/14/2019  . Tdap 06/03/2009, 10/28/2016, 06/28/2019    Conditions to be addressed/monitored:  Hyperlipidemia, Diabetes and Cirrhosis  Care Plan : [place]  Updates made by Charlton Haws, [place] since 04/19/2020 12:00 AM    Problem: Hyperlipidemia, Diabetes and Cirrhosis   Priority: High    Long-Range Goal: Disease management   Start Date: 04/19/2020  Expected End Date: 10/20/2020  This Visit's Progress: On track  Priority: High  Note:   Current Barriers:  . Unable to independently afford treatment regimen . Unable to independently monitor therapeutic efficacy . Unable to maintain control of diabetes  Pharmacist Clinical Goal(s):  Marland Kitchen Patient will verbalize ability to afford treatment regimen . achieve adherence to monitoring guidelines and medication  adherence to achieve therapeutic efficacy . maintain control of diabetes as evidenced by improved A1c  through collaboration with PharmD and [place].   Interventions: . 1:1 collaboration with [name], MD regarding development and update of comprehensive plan of care as evidenced by Javid Kemler attestation and co-signature . Inter-disciplinary care team collaboration (see longitudinal plan of care) . Comprehensive medication review performed; medication list updated in electronic medical record  Hyperlipidemia (LDL goal < 100) . Not ideally controlled - pt was previously taking atorvastatin, but this was discontinued in summer 2021 by cardiologist due to cirrhosis diagnosis. LDL increased from 81 to 114 since stopping statin. LFTs, ammonia levels have been stable in normal range. . Current regimen:  o No medications . Interventions: o Discussed risks/benefits of statin use in context of cirrhosis. Recent data supports use of statins in cirrhosis -  Large retrospective cohort studies have shown reduced risk of disease progression, hepatic decompensation, hepatocellular carcinoma development, and death o Recommend to discuss restarting statin with GI and/or cardiology  Diabetes (A1c goal < 7%) . Not ideally controlled - patient reports fasting BG 120-150, checking once a week. He denies hypoglycemia (ever). . Current regimen:  o Toujeo Max 50 units daily (samples) o Metformin 500 mg BID o Farxiga 10 mg daily (via PAP) . Interventions: o Discussed A1c goal and benefits of medications o Discussed progression of diabetes and low likelihood of control without insulin o Pursue patient assistance for Toujeo [employer]) o Recommend to continue current medication and check BG daily  Cirrhosis . Controlled -  per notes, currently well compensated. Patient is abstaining from alcohol. He is not taking lactulose. . Current regimen:  o Lactulose 10 gm/69m twice a day - not taking o Furosemide 20  mg daily o Potassium chloride 20 mEq daily . Interventions: o Discussed benefit of lactulose for removal of ammonium and treatment of hepatic encephalopathy - patient has not been taking since his first Rx ran out o Discussed benefit of furosemide for fluid removal and prevention of ascites - Furosemide causes potassium loss in the urine, so potassium supplement is needed with furosemide o Recommended to follow up with gastroenterologist regarding necessity of lactulose  Patient Goals/Self-Care Activities . Patient will:  - take medications as prescribed focus on medication adherence by routine check glucose daily, document, and provide at future appointments  -Contact Dr GCelesta Averoffice to make follow up appt to discuss Lactulose and statin   Follow Up Plan: Telephone follow up appointment with care management team member scheduled for: 3 months      Medication Assistance: FWilder Gladeobtained through [place] medication [place].  Enrollment ends 186/16/8372 -[employer] application for TGoodyear Tirein process. Anticipated start date 05/20/20.  Patient's preferred pharmacy is:  [id number] [place] [place]hone: [phone]: [address]one: [phone]: [phone]hone: [phone]: [phone] [address]one: [phone]: [phone] pill box? No - prefers bottles Pt endorses 100% compliance  We discussed: Current pharmacy is preferred with insurance plan and patient is satisfied with pharmacy  services Patient decided to: Continue current medication management strategy  Care Plan and Follow Up Patient Decision:  Patient agrees to Care Plan and Follow-up.  Plan: Telephone follow up appointment with care management team member scheduled for:  3 months  LiCharlene BrookePharmD, BCPhoebe Worth Medical Centerlinical Pharmacist [place] at [phone]

## 2020-04-19 NOTE — Patient Instructions (Signed)
Visit Information  Phone number for Pharmacist: [phone]  Goals Addressed   None    Patient Care Plan: [place] Plan    Problem Identified: Hyperlipidemia, Diabetes and Cirrhosis   Priority: High    Long-Range Goal: Disease management   Start Date: 04/19/2020  Expected End Date: 10/20/2020  This Visit's Progress: On track  Priority: High  Note:   Current Barriers:  . Unable to independently afford treatment regimen . Unable to independently monitor therapeutic efficacy . Unable to maintain control of diabetes  Pharmacist Clinical Goal(s):  Marland Kitchen Patient will verbalize ability to afford treatment regimen . achieve adherence to monitoring guidelines and medication adherence to achieve therapeutic efficacy . maintain control of diabetes as evidenced by improved A1c  through collaboration with PharmD and provider.   Interventions: . 1:1 collaboration with [name], MD regarding development and update of comprehensive plan of care as evidenced by provider attestation and co-signature . Inter-disciplinary care team collaboration (see longitudinal plan of care) . Comprehensive medication review performed; medication list updated in electronic medical record  Hyperlipidemia (LDL goal < 100) . Not ideally controlled - pt was previously taking atorvastatin, but this was discontinued in summer 2021 by cardiologist due to cirrhosis diagnosis. LDL increased from 81 to 114 since stopping statin. LFTs, ammonia levels have been stable in normal range. . Current regimen:  o No medications . Interventions: o Discussed risks/benefits of statin use in context of cirrhosis. Recent data supports use of statins in cirrhosis -  Large retrospective cohort studies have shown reduced risk of disease progression, hepatic decompensation, hepatocellular carcinoma development, and death o Recommend to discuss restarting statin with GI and/or cardiology  Diabetes (A1c goal < 7%) . Not  ideally controlled - patient reports fasting BG 120-150, checking once a week. He denies hypoglycemia (ever). . Current regimen:  o Toujeo Max 50 units daily (samples) o Metformin 500 mg BID o Farxiga 10 mg daily (via PAP) . Interventions: o Discussed A1c goal and benefits of medications o Discussed progression of diabetes and low likelihood of control without insulin o Pursue patient assistance for Toujeo [employer]) o Recommend to continue current medication and check BG daily  Cirrhosis . Controlled - per notes, currently well compensated. Patient is abstaining from alcohol. He is not taking lactulose. . Current regimen:  o Lactulose 10 gm/23mL twice a day - not taking o Furosemide 20 mg daily o Potassium chloride 20 mEq daily . Interventions: o Discussed benefit of lactulose for removal of ammonium and treatment of hepatic encephalopathy - patient has not been taking since his first Rx ran out o Discussed benefit of furosemide for fluid removal and prevention of ascites - Furosemide causes potassium loss in the urine, so potassium supplement is needed with furosemide o Recommended to follow up with gastroenterologist regarding necessity of lactulose  Patient Goals/Self-Care Activities . Patient will:  - take medications as prescribed focus on medication adherence by routine check glucose daily, document, and provide at future appointments  -Contact Dr Celesta Aver office to make follow up appt to discuss Lactulose and statin   Follow Up Plan: Telephone follow up appointment with care management team member scheduled for: 3 months     The patient verbalized understanding of instructions, educational materials, and care plan provided today and declined offer to receive copy of patient instructions, educational materials, and care plan.  Telephone follow up appointment with pharmacy team member scheduled for: 3 months  Charlene Brooke, PharmD, [facility] Clinical Pharmacist [place]  Primary Care at [place]

## 2020-04-26 DIAGNOSIS — Z961 Presence of intraocular lens: Secondary | ICD-10-CM | POA: Diagnosis not present

## 2020-04-26 DIAGNOSIS — Z794 Long term (current) use of insulin: Secondary | ICD-10-CM | POA: Diagnosis not present

## 2020-04-26 DIAGNOSIS — E119 Type 2 diabetes mellitus without complications: Secondary | ICD-10-CM | POA: Diagnosis not present

## 2020-04-26 LAB — HM DIABETES EYE EXAM

## 2020-04-29 ENCOUNTER — Other Ambulatory Visit: Payer: Self-pay | Admitting: Internal Medicine

## 2020-05-03 ENCOUNTER — Telehealth: Payer: Self-pay | Admitting: Pharmacist

## 2020-05-03 NOTE — Progress Notes (Signed)
    Chronic Care Management Pharmacy Assistant   Name: Patrick Kidd  MRN: [id number] DOB: Mar 03, 1945   Reason for Encounter: Chart Review   Medications: Outpatient Encounter Medications as of 05/03/2020  Medication Sig Note  . azelastine (ASTELIN) 0.1 % nasal spray Place 2 sprays into both nostrils 2 (two) times daily. Use in each nostril as directed (Patient not taking: Reported on 04/19/2020)   . blood glucose meter kit and supplies KIT Use to test blood sugar once daily. DX E11.8   . Blood Glucose Monitoring Suppl (FREESTYLE FREEDOM LITE) w/Device KIT Use to check blood sugars 2 times daily   . Colchicine (MITIGARE) 0.6 MG CAPS Take 1 tablet by mouth daily. 11/16/2019: PRN  . dapagliflozin propanediol (FARXIGA) 10 MG TABS tablet Take 1 tablet (10 mg total) by mouth daily before breakfast.   . fluticasone (FLONASE) 50 MCG/ACT nasal spray SPRAY 2 SPRAYS INTO EACH NOSTRIL EVERY DAY   . folic acid (FOLVITE) 1 MG tablet TAKE 1 TABLET EVERY DAY   . furosemide (LASIX) 20 MG tablet TAKE 1 TABLET(20 MG) BY MOUTH DAILY   . gabapentin (NEURONTIN) 300 MG capsule Take 1 capsule (300 mg total) by mouth 2 (two) times daily.   . Glucagon (GVOKE HYPOPEN 2-PACK) 1 MG/0.2ML SOAJ Inject 1 Act into the skin daily as needed. (Patient not taking: Reported on 04/19/2020)   . glucose blood (COOL BLOOD GLUCOSE TEST STRIPS) test strip Use to check blood sugar daily. DX: E11.8   . insulin glargine, 2 Unit Dial, (TOUJEO MAX SOLOSTAR) 300 UNIT/ML Solostar Pen Inject 50 Units into the skin daily.   Marland Kitchen KLOR-CON M20 20 MEQ tablet TAKE 1 TABLET BY MOUTH EVERY DAY   . lactulose (CHRONULAC) 10 GM/15ML solution TAKE 15 MLS (10 G TOTAL) BY MOUTH 2 (TWO) TIMES DAILY. (Patient not taking: Reported on 04/19/2020)   . Lancets (ACCU-CHEK MULTICLIX) lancets Use to check sugar daily. DX: E11.8   . metFORMIN (GLUCOPHAGE) 500 MG tablet TAKE 1 TABLET BY MOUTH 2 TIMES DAILY WITH A MEAL.   . Multiple Vitamin (MULTIVITAMIN WITH  MINERALS) TABS tablet Take 1 tablet by mouth daily.   . pantoprazole (PROTONIX) 40 MG tablet TAKE 1 TABLET(40 MG) BY MOUTH TWICE DAILY   . tadalafil (CIALIS) 20 MG tablet Take 1 tablet (20 mg total) by mouth daily as needed for erectile dysfunction.   . thiamine 100 MG tablet Take 1 tablet (100 mg total) by mouth every other day.    No facility-administered encounter medications on file as of 05/03/2020.    Reviewed chart for medication changes and adherence.   No gaps in adherence identified. Patient has follow up scheduled with pharmacy team. No further action required.   [place] Pharmacist Assistant [phone]

## 2020-05-10 ENCOUNTER — Other Ambulatory Visit: Payer: Self-pay | Admitting: Internal Medicine

## 2020-05-14 ENCOUNTER — Telehealth: Payer: Self-pay | Admitting: Pharmacist

## 2020-05-14 NOTE — Telephone Encounter (Signed)
Patient has Tax inspector and reports copay for Nelva Nay is cost prohibitive at this time.  Reviewed application process for [employer] patient assistance program. Patient meets income/out of pocket spend criteria for the program. Patient will provide proof of income, out of pocket spend report, and will sign application. Will collaborate with prescriber Dr Ronnald Ramp for the provider portion of application. Once completed, application will be submitted via Fax   Patient assistance program Fax number: [phone]  Faxed application 08/15/90. Will await response from [employer].  Charlton Haws, [facility]

## 2020-05-15 NOTE — Telephone Encounter (Signed)
Patient called requesting Toujeo samples. Informed patient there are no samples in the office. Pt reports he has about 1/2 pen left which is ~9 day supply. Will await response from [employer] PAP and try to contact rep for more samples.

## 2020-05-23 ENCOUNTER — Ambulatory Visit (INDEPENDENT_AMBULATORY_CARE_PROVIDER_SITE_OTHER): Payer: Medicare Other | Admitting: Internal Medicine

## 2020-05-23 ENCOUNTER — Encounter: Payer: Self-pay | Admitting: Internal Medicine

## 2020-05-23 ENCOUNTER — Other Ambulatory Visit: Payer: Self-pay

## 2020-05-23 ENCOUNTER — Telehealth: Payer: Self-pay

## 2020-05-23 VITALS — BP 180/80 | HR 81 | Ht 74.0 in | Wt 246.0 lb

## 2020-05-23 DIAGNOSIS — K7031 Alcoholic cirrhosis of liver with ascites: Secondary | ICD-10-CM | POA: Diagnosis not present

## 2020-05-23 DIAGNOSIS — K652 Spontaneous bacterial peritonitis: Secondary | ICD-10-CM | POA: Diagnosis not present

## 2020-05-23 DIAGNOSIS — K7290 Hepatic failure, unspecified without coma: Secondary | ICD-10-CM

## 2020-05-23 DIAGNOSIS — K7682 Hepatic encephalopathy: Secondary | ICD-10-CM

## 2020-05-23 NOTE — Assessment & Plan Note (Addendum)
On treatment he is a child's a 5 points today.  That is to say he does not have ascites or encephalopathy but has been medicated for these  Meld score equals 11  Recent ultrasound without lesions or ascites  Use advised complete abstinence is my recommendation no alcohol.  Schedule EGD for variceal screening given his history of decompensated cirrhosis (ascites and history of SBP plus encephalopathy)

## 2020-05-23 NOTE — Telephone Encounter (Signed)
Medication ready for pick up VM left for pt  Located in upstairs unused lab fridge. Bottom shelf.

## 2020-05-23 NOTE — Assessment & Plan Note (Signed)
Off suppressive therapy due to medication side effects observing

## 2020-05-23 NOTE — Progress Notes (Signed)
Patrick Kidd 75 y.o. 1945-08-23 [id number]  Assessment & Plan:   Alcoholic cirrhosis ([place]) On treatment he is a child's a 5 points today.  That is to say he does not have ascites or encephalopathy but has been medicated for these  Meld score equals 11  Recent ultrasound without lesions or ascites  Use advised complete abstinence is my recommendation no alcohol.  Schedule EGD for variceal screening given his history of decompensated cirrhosis (ascites and history of SBP plus encephalopathy)  Encephalopathy, hepatic (HCC) Continue lactulose  SBP (spontaneous bacterial peritonitis) (HCC)-December 2020,  Off suppressive therapy due to medication side effects observing   CC: Patrick Lima, MD   Subjective:   Chief Complaint: Alcoholic cirrhosis follow-up  HPI The patient is a 75 year old man with alcoholic cirrhosis with history of ascites, who presents for a 58-month follow-up reporting is feeling well.  He did walk at the beach this weekend and his ankles and calves were quite sore.  He has a history of ascites he has a history of SBP but cannot really tolerate the antibiotic therapy due to tendon issues with Cipro and then Septra reaction I think.  He takes lactulose for encephalopathy.  He had nausea and vomiting with Xifaxan.  He reports that he has been drinking a glass of wine about twice a month.  He says he does not have an addiction problem its a social thing.  Does not drink in front of his wife.  [facility]    Chemistry      Component Value Date/Time   NA 139 03/14/2020 1348   K 4.6 03/14/2020 1348   CL 102 03/14/2020 1348   CO2 31 03/14/2020 1348   BUN 20 03/14/2020 1348   CREATININE 1.41 03/14/2020 1348   CREATININE 1.13 06/04/2010 1354      Component Value Date/Time   CALCIUM 9.5 03/14/2020 1348   ALKPHOS 67 03/14/2020 1348   AST 16 03/14/2020 1348   ALT 11 03/14/2020 1348   BILITOT 1.0 03/14/2020 1348     Lab Results   Component Value Date   WBC 7.8 03/14/2020   HGB 15.8 03/14/2020   HCT 45.9 03/14/2020   MCV 89.1 03/14/2020   PLT 85.0 (L) 03/14/2020   Lab Results  Component Value Date   INR 1.1 (H) 03/14/2020   INR 1.0 11/16/2019   INR 1.1 (H) 06/09/2019    Wt Readings from Last 3 Encounters:  05/23/20 246 lb (111.6 kg)  03/14/20 242 lb 6.4 oz (110 kg)  03/12/20 243 lb (110.2 kg)    Allergies  Allergen Reactions  . Rifaximin Nausea And Vomiting    Required EMS visit, although patient did not go to hospital  . 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  Use with caution  . Calcium Channel Blockers     Likely WPW.  Use with caution.   Current Meds  Medication Sig  . blood glucose meter kit and supplies KIT Use to test blood sugar once daily. DX E11.8  . Blood Glucose Monitoring Suppl (FREESTYLE FREEDOM LITE) w/Device KIT Use to check blood sugars 2 times daily  . Colchicine (MITIGARE) 0.6 MG CAPS Take 1 tablet by mouth daily.  . dapagliflozin propanediol (FARXIGA) 10 MG TABS tablet Take 1 tablet (10 mg total) by mouth daily before breakfast.  . fluticasone (FLONASE) 50 MCG/ACT nasal spray SPRAY 2 SPRAYS INTO EACH NOSTRIL EVERY DAY  . folic acid (FOLVITE) 1  MG tablet TAKE 1 TABLET EVERY DAY  . furosemide (LASIX) 20 MG tablet TAKE 1 TABLET(20 MG) BY MOUTH DAILY  . gabapentin (NEURONTIN) 300 MG capsule Take 1 capsule (300 mg total) by mouth 2 (two) times daily.  Marland Kitchen glucose blood (COOL BLOOD GLUCOSE TEST STRIPS) test strip Use to check blood sugar daily. DX: E11.8  . insulin glargine, 2 Unit Dial, (TOUJEO MAX SOLOSTAR) 300 UNIT/ML Solostar Pen Inject 50 Units into the skin daily.  Marland Kitchen KLOR-CON M20 20 MEQ tablet TAKE 1 TABLET BY MOUTH EVERY DAY  . lactulose (CHRONULAC) 10 GM/15ML solution TAKE 15 MLS (10 G TOTAL) BY MOUTH 2 (TWO) TIMES DAILY.  Marland Kitchen Lancets (ACCU-CHEK MULTICLIX) lancets Use to check sugar daily. DX: E11.8  . metFORMIN (GLUCOPHAGE) 500 MG tablet TAKE 1 TABLET BY MOUTH 2 TIMES DAILY  WITH A MEAL.  . Multiple Vitamin (MULTIVITAMIN WITH MINERALS) TABS tablet Take 1 tablet by mouth daily.  . pantoprazole (PROTONIX) 40 MG tablet TAKE 1 TABLET(40 MG) BY MOUTH TWICE DAILY  . tadalafil (CIALIS) 20 MG tablet Take 1 tablet (20 mg total) by mouth daily as needed for erectile dysfunction.  . thiamine 100 MG tablet Take 1 tablet (100 mg total) by mouth every other day.   Past Medical History:  Diagnosis Date  . Acrophobia   . Alcoholic cirrhosis ([place]) 02/04/7508  . Anemia   . Arthritis    foot by big toe  . BPH associated with nocturia   . Cataract    removed both eyes  . Chronic cough    PMH of  . COVID-19 12/2018  . Diabetes mellitus without complication ([place])   . Diverticulosis 07-03-2010   Colonoscopy.   . Duodenal ulcer 2017  . Fallen arches    Bilateral  . GERD (gastroesophageal reflux disease)   . Gout   . Granuloma annulare   . Hx of adenomatous colonic polyps multiple  . Hydrocele 2011   Large septated right hydrocele  . Liver cyst   . Liver lesion   . Nonspecific elevation of levels of transaminase or lactic acid dehydrogenase (LDH)   . Obesity   . Peripheral neuropathy   . Plantar fasciitis    PMH of  . Portal hypertension ([place]) 2017  . Prostate cancer ([place])   . Right shoulder pain 11/2017  . Sleep apnea    no cpap, patient denies  . Thiamine deficiency   . Wears reading eyeglasses   . WPW (Wolff-Parkinson-White syndrome) 02/05/2019   Past Surgical History:  Procedure Laterality Date  . CATARACT EXTRACTION, BILATERAL  12/2011    Dr Gershon Crane  . COLONOSCOPY  2017  . COLONOSCOPY W/ POLYPECTOMY  07/03/2010   2 adenomas, diverticulosis on right. Dr [name]  . CYSTOSCOPY N/A 03/15/2018   Procedure: CYSTOSCOPY FLEXIBLE;  Surgeon: [name], MD;  [facility];  Service: Urology;  Laterality: N/A;  NO SEEDS FOUND IN BLADDER  . ESOPHAGOGASTRODUODENOSCOPY (EGD) WITH PROPOFOL N/A 01/08/2016   Procedure:  ESOPHAGOGASTRODUODENOSCOPY (EGD) WITH PROPOFOL;  Surgeon: [name], MD;  Location: [facility];  Service: Endoscopy;  Laterality: N/A;  . FLEXIBLE SIGMOIDOSCOPY  2000  . RADIOACTIVE SEED IMPLANT N/A 03/15/2018   Procedure: RADIOACTIVE SEED IMPLANT/BRACHYTHERAPY IMPLANT;  Surgeon: [name], MD;  Location: [facility];  Service: Urology;  Laterality: N/A;   73 SEEDS IMPLANTED  . SIGMOIDOSCOPY    . SPACE OAR INSTILLATION N/A 03/15/2018   Procedure: SPACE OAR INSTILLATION;  Surgeon: Link Snuffer  D III, MD;  Location: [facility];  Service: Urology;  Laterality: N/A;  . WISDOM TOOTH EXTRACTION     Social History   Social History Narrative   Worked in Adult nurse estate   2 children   Former smoker no drug use   Alcoholism, abstinent since 01/23/2019 most recently   Fun/Hobby: Play golf, grandchildren, [employer]      Patient is left-handed. He lives with his wife in a one level home. He swims most days.   family history includes Leukemia in his father; Lung cancer in his mother.   Review of Systems As above  Objective:   Physical Exam BP (!) 180/80   Pulse 81   Ht 6' 2"  (1.88 m)   Wt 246 lb (111.6 kg)   SpO2 94%   BMI 31.58 kg/m  Obese elderly white man no acute distress He is alert and oriented x3 The eyes are anicteric The lungs are clear The abdomen is soft obese nontender without organomegaly or mass The extremities are free of cyanosis clubbing or edema

## 2020-05-23 NOTE — Assessment & Plan Note (Signed)
Continue lactulose

## 2020-05-23 NOTE — Patient Instructions (Addendum)
Normal BMI (Body Mass Index- based on height and weight) is between 23 and 30. Your BMI today is Body mass index is 31.58 kg/m. Marland Kitchen Please consider follow up  regarding your BMI with your Primary Care Provider.  You have been scheduled for an endoscopy. Please follow written instructions given to you at your visit today. If you use inhalers (even only as needed), please bring them with you on the day of your procedure.  Due to recent changes in healthcare laws, you may see the results of your imaging and laboratory studies on MyChart before your provider has had a chance to review them.  We understand that in some cases there may be results that are confusing or concerning to you. Not all laboratory results come back in the same time frame and the provider may be waiting for multiple results in order to interpret others.  Please give [place] 48 hours in order for your provider to thoroughly review all the results before contacting the office for clarification of your results.   Strict absence of alcohol is the best thing for your health.  I appreciate the opportunity to care for you. Silvano Rusk, MD, [facility]

## 2020-06-05 ENCOUNTER — Telehealth: Payer: Self-pay | Admitting: Pharmacist

## 2020-06-05 NOTE — Progress Notes (Signed)
Chronic Care Management Pharmacy Assistant   Name: Patrick Kidd  MRN: [id number] DOB: [birth year]   Reason for Encounter: Diabetic Disease State Call   Conditions to be addressed/monitored: DMII   Recent office visits:  None ID  Recent consult visits:  05/23/20 Dr. Silvano Rusk [facility] visits:  None in previous 6 months  Medications: Outpatient Encounter Medications as of 06/05/2020  Medication Sig Note  . azelastine (ASTELIN) 0.1 % nasal spray Place 2 sprays into both nostrils 2 (two) times daily. Use in each nostril as directed (Patient not taking: No sig reported)   . blood glucose meter kit and supplies KIT Use to test blood sugar once daily. DX E11.8   . Blood Glucose Monitoring Suppl (FREESTYLE FREEDOM LITE) w/Device KIT Use to check blood sugars 2 times daily   . Colchicine (MITIGARE) 0.6 MG CAPS Take 1 tablet by mouth daily. 11/16/2019: PRN  . dapagliflozin propanediol (FARXIGA) 10 MG TABS tablet Take 1 tablet (10 mg total) by mouth daily before breakfast.   . fluticasone (FLONASE) 50 MCG/ACT nasal spray SPRAY 2 SPRAYS INTO EACH NOSTRIL EVERY DAY   . folic acid (FOLVITE) 1 MG tablet TAKE 1 TABLET EVERY DAY   . furosemide (LASIX) 20 MG tablet TAKE 1 TABLET(20 MG) BY MOUTH DAILY   . gabapentin (NEURONTIN) 300 MG capsule Take 1 capsule (300 mg total) by mouth 2 (two) times daily.   . Glucagon (GVOKE HYPOPEN 2-PACK) 1 MG/0.2ML SOAJ Inject 1 Act into the skin daily as needed. (Patient not taking: No sig reported)   . glucose blood (COOL BLOOD GLUCOSE TEST STRIPS) test strip Use to check blood sugar daily. DX: E11.8   . insulin glargine, 2 Unit Dial, (TOUJEO MAX SOLOSTAR) 300 UNIT/ML Solostar Pen Inject 50 Units into the skin daily.   Marland Kitchen KLOR-CON M20 20 MEQ tablet TAKE 1 TABLET BY MOUTH EVERY DAY   . lactulose (CHRONULAC) 10 GM/15ML solution TAKE 15 MLS (10 G TOTAL) BY MOUTH 2 (TWO) TIMES DAILY.   Marland Kitchen Lancets (ACCU-CHEK MULTICLIX) lancets Use to check sugar daily.  DX: E11.8   . metFORMIN (GLUCOPHAGE) 500 MG tablet TAKE 1 TABLET BY MOUTH 2 TIMES DAILY WITH A MEAL.   . Multiple Vitamin (MULTIVITAMIN WITH MINERALS) TABS tablet Take 1 tablet by mouth daily.   . pantoprazole (PROTONIX) 40 MG tablet TAKE 1 TABLET(40 MG) BY MOUTH TWICE DAILY   . tadalafil (CIALIS) 20 MG tablet Take 1 tablet (20 mg total) by mouth daily as needed for erectile dysfunction.   . thiamine 100 MG tablet Take 1 tablet (100 mg total) by mouth every other day.    No facility-administered encounter medications on file as of 06/05/2020.    Recent Relevant Labs: Lab Results  Component Value Date/Time   HGBA1C 7.5 (H) 03/14/2020 01:48 PM   HGBA1C 8.4 (H) 11/16/2019 11:35 AM   MICROALBUR 3.4 (H) 03/14/2019 10:36 AM   MICROALBUR 1.3 10/10/2016 08:29 AM    Kidney Function Lab Results  Component Value Date/Time   CREATININE 1.41 03/14/2020 01:48 PM   CREATININE 1.12 11/16/2019 11:36 AM   CREATININE 1.13 06/04/2010 01:54 PM   GFR 48.79 (L) 03/14/2020 01:48 PM   GFRNONAA >60 02/06/2019 05:46 AM   GFRAA >60 02/06/2019 05:46 AM    . Current antihyperglycemic regimen:  Farxiga 10 mg 1 tab daily, metformin 500 mg 1 tab daily, toujeo 50 units  . What recent interventions/DTPs have been made to improve glycemic control:  None ID  .  Have there been any recent hospitalizations or ED visits since last visit with CPP? None ID  . Patient denies any high readings and hypoglycemic symptom .  Patient denies any low readings and hyperglycemic symptoms  . How often are you checking your blood sugar?Patient states that he checks blood sugar maybe once every 2 weeks  . What are your blood sugars ranging? Blood sugar today was 141 o Fasting:  o Before meals:  o After meals: Patient states he usually checks after a meal o Bedtime:  . During the week, how often does your blood glucose drop below 70? No readings below 70  . Are you checking your feet daily/regularly? Patient states no problems  with feet  Adherence Review: Is the patient currently on a STATIN medication? No Is the patient currently on ACE/ARB medication? No Does the patient have >5 day gap between last estimated fill dates? No   Star Rating Drugs: Metformin 03/28/20 180 ds  [place] Pharmacist Assistant [phone]  Time spent:21

## 2020-06-12 ENCOUNTER — Telehealth: Payer: Self-pay | Admitting: *Deleted

## 2020-06-12 ENCOUNTER — Telehealth (INDEPENDENT_AMBULATORY_CARE_PROVIDER_SITE_OTHER): Payer: Medicare Other | Admitting: Family Medicine

## 2020-06-12 ENCOUNTER — Encounter: Payer: Self-pay | Admitting: Family Medicine

## 2020-06-12 ENCOUNTER — Other Ambulatory Visit (HOSPITAL_COMMUNITY): Payer: Self-pay

## 2020-06-12 DIAGNOSIS — U071 COVID-19: Secondary | ICD-10-CM

## 2020-06-12 MED ORDER — BENZONATATE 100 MG PO CAPS
100.0000 mg | ORAL_CAPSULE | Freq: Three times a day (TID) | ORAL | 0 refills | Status: DC | PRN
  Filled 2020-06-12: qty 20, 7d supply, fill #0

## 2020-06-12 MED ORDER — MOLNUPIRAVIR EUA 200MG CAPSULE
4.0000 | ORAL_CAPSULE | Freq: Two times a day (BID) | ORAL | 0 refills | Status: AC
  Filled 2020-06-12: qty 40, 5d supply, fill #0

## 2020-06-12 NOTE — Progress Notes (Signed)
Virtual Visit via Video Note  I connected with Patrick Kidd  on 06/12/20 at  4:20 PM EDT by a video enabled telemedicine application and verified that I am speaking with the correct person using two identifiers.  Location patient: home, [place] Location provider:work or home office Persons participating in the virtual visit: patient, provider, wife  I discussed the limitations of evaluation and management by telemedicine and the availability of in person appointments. The patient expressed understanding and agreed to proceed.   HPI:  Acute telemedicine visit for Covid19: -Onset: yesterday -Symptoms include: scratchy throat, cough, feels tired -Denies:CP, SOB, body aches, NVD, inability to eat/drink/get out of bed -Has tried: vitamins, zinc, alk cold plus -Pertinent past medical history: PVD, WPW, PAF, HTN, liver disease, prostate cancer - and more see below -Pertinent medication allergies: Allergies  Allergen Reactions  . Rifaximin Nausea And Vomiting    Required EMS visit, although patient did not go to hospital  . 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  Use with caution  . Calcium Channel Blockers     Likely WPW.  Use with caution.  -COVID-19 vaccine status: had 2 doses and a booster moderna -recent labs in 03/14/20 -patient reports hx of  liver dz as a result of prior covid infection and is very anxious about taking anything that could harm the liver.  ROS: See pertinent positives and negatives per HPI.  Past Medical History:  Diagnosis Date  . Acrophobia   . Alcoholic cirrhosis ([place]) 3/47/4259  . Anemia   . Arthritis    foot by big toe  . BPH associated with nocturia   . Cataract    removed both eyes  . Chronic cough    PMH of  . COVID-19 12/2018  . Diabetes mellitus without complication ([place])   . Diverticulosis 07-03-2010   Colonoscopy.   . Duodenal ulcer 2017  . Fallen arches    Bilateral  . GERD (gastroesophageal reflux disease)   . Gout   . Granuloma annulare   . Hx  of adenomatous colonic polyps multiple  . Hydrocele 2011   Large septated right hydrocele  . Liver cyst   . Liver lesion   . Nonspecific elevation of levels of transaminase or lactic acid dehydrogenase (LDH)   . Obesity   . Peripheral neuropathy   . Plantar fasciitis    PMH of  . Portal hypertension ([place]) 2017  . Prostate cancer ([place])   . Right shoulder pain 11/2017  . Sleep apnea    no cpap, patient denies  . Thiamine deficiency   . Wears reading eyeglasses   . WPW (Wolff-Parkinson-White syndrome) 02/05/2019    Past Surgical History:  Procedure Laterality Date  . CATARACT EXTRACTION, BILATERAL  12/2011    Dr Gershon Crane  . COLONOSCOPY  2017  . COLONOSCOPY W/ POLYPECTOMY  07/03/2010   2 adenomas, diverticulosis on right. Dr [name]  . CYSTOSCOPY N/A 03/15/2018   Procedure: CYSTOSCOPY FLEXIBLE;  Surgeon: [name], MD;  Location: [facility];  Service: Urology;  Laterality: N/A;  NO SEEDS FOUND IN BLADDER  . ESOPHAGOGASTRODUODENOSCOPY (EGD) WITH PROPOFOL N/A 01/08/2016   Procedure: ESOPHAGOGASTRODUODENOSCOPY (EGD) WITH PROPOFOL;  Surgeon: [name], MD;  [facility];  Service: Endoscopy;  Laterality: N/A;  . FLEXIBLE SIGMOIDOSCOPY  2000  . RADIOACTIVE SEED IMPLANT N/A 03/15/2018   Procedure: RADIOACTIVE SEED IMPLANT/BRACHYTHERAPY IMPLANT;  Surgeon: [name], MD;  [facility];  Service: Urology;  Laterality:  N/A;   73 SEEDS IMPLANTED  . SIGMOIDOSCOPY    . SPACE OAR INSTILLATION N/A 03/15/2018   Procedure: SPACE OAR INSTILLATION;  Surgeon: [name], MD;  Location: [facility];  Service: Urology;  Laterality: N/A;  . WISDOM TOOTH EXTRACTION       Current Outpatient Medications:  .  benzonatate (TESSALON PERLES) 100 MG capsule, Take 1 capsule (100 mg total) by mouth 3 (three) times daily as needed., Disp: 20 capsule, Rfl: 0 .  blood glucose meter kit and supplies KIT, Use to test blood  sugar once daily. DX E11.8, Disp: 1 each, Rfl: 0 .  Blood Glucose Monitoring Suppl (FREESTYLE FREEDOM LITE) w/Device KIT, Use to check blood sugars 2 times daily, Disp: 1 each, Rfl: 1 .  Colchicine (MITIGARE) 0.6 MG CAPS, Take 1 tablet by mouth daily., Disp: 90 capsule, Rfl: 0 .  dapagliflozin propanediol (FARXIGA) 10 MG TABS tablet, Take 1 tablet (10 mg total) by mouth daily before breakfast., Disp: 112 tablet, Rfl: 0 .  fluticasone (FLONASE) 50 MCG/ACT nasal spray, SPRAY 2 SPRAYS INTO EACH NOSTRIL EVERY DAY, Disp: 48 mL, Rfl: 1 .  folic acid (FOLVITE) 1 MG tablet, TAKE 1 TABLET EVERY DAY, Disp: 90 tablet, Rfl: 1 .  furosemide (LASIX) 20 MG tablet, TAKE 1 TABLET(20 MG) BY MOUTH DAILY, Disp: 90 tablet, Rfl: 1 .  gabapentin (NEURONTIN) 300 MG capsule, Take 1 capsule (300 mg total) by mouth 2 (two) times daily., Disp: 180 capsule, Rfl: 3 .  glucose blood (COOL BLOOD GLUCOSE TEST STRIPS) test strip, Use to check blood sugar daily. DX: E11.8, Disp: 300 each, Rfl: 3 .  insulin glargine, 2 Unit Dial, (TOUJEO MAX SOLOSTAR) 300 UNIT/ML Solostar Pen, Inject 50 Units into the skin daily., Disp: 3 mL, Rfl: 3 .  KLOR-CON M20 20 MEQ tablet, TAKE 1 TABLET BY MOUTH EVERY DAY, Disp: 90 tablet, Rfl: 1 .  lactulose (CHRONULAC) 10 GM/15ML solution, TAKE 15 MLS (10 G TOTAL) BY MOUTH 2 (TWO) TIMES DAILY., Disp: 946 mL, Rfl: 2 .  Lancets (ACCU-CHEK MULTICLIX) lancets, Use to check sugar daily. DX: E11.8, Disp: 100 each, Rfl: 12 .  metFORMIN (GLUCOPHAGE) 500 MG tablet, TAKE 1 TABLET BY MOUTH 2 TIMES DAILY WITH A MEAL., Disp: 180 tablet, Rfl: 0 .  molnupiravir EUA 200 mg CAPS, Take 4 capsules (800 mg total) by mouth 2 (two) times daily for 5 days., Disp: 40 capsule, Rfl: 0 .  Multiple Vitamin (MULTIVITAMIN WITH MINERALS) TABS tablet, Take 1 tablet by mouth daily., Disp:  , Rfl:  .  tadalafil (CIALIS) 20 MG tablet, Take 1 tablet (20 mg total) by mouth daily as needed for erectile dysfunction., Disp: 6 tablet, Rfl: 3 .   thiamine 100 MG tablet, Take 1 tablet (100 mg total) by mouth every other day. (Patient taking differently: Take 200 mg by mouth daily.), Disp: 45 tablet, Rfl: 1  EXAM:  VITALS per patient if applicable:  GENERAL: alert, oriented, appears well and in no acute distress  HEENT: atraumatic, conjunttiva clear, no obvious abnormalities on inspection of external nose and ears  NECK: normal movements of the head and neck  LUNGS: on inspection no signs of respiratory distress, breathing rate appears normal, no obvious gross SOB, gasping or wheezing  CV: no obvious cyanosis  MS: moves all visible extremities without noticeable abnormality  PSYCH/NEURO: pleasant and cooperative, no obvious depression or anxiety, speech and thought processing grossly intact  ASSESSMENT AND PLAN:  Discussed the following assessment and  plan:  COVID-19  -we discussed possible serious and likely etiologies, options for evaluation and workup, limitations of telemedicine visit vs in person visit, treatment, treatment risks and precautions. Pt prefers to treat via telemedicine empirically rather than in person at this moment.  Discussed treatment options, ideal treatment window, potential complications, isolation and precautions for COVID-19.  The patient opted for treatment with Molnupiravir due to being higher risk for complications of covid or severe disease and his preferences after a lengthy discussion of all options and available data in terms of risks/side effects/etc. He was still anxious about treatment due to his liver disease. Advised I am not aware of a contraindication with mulnupiravir, but we checked with his gastroenterologist to ensure they were ok with him taking this and they provided approval. . Discussed EUA statysm limited knowledge of risks/interactions/side effects per EUA document vs possible benefits and precautions share with patient and provided in patient instructions. Also, advised that patient  discuss risks/interactions and use with pharmacist/treatment team as well.  He also opted for [facility] - Rx sent.  Other symptomatic care measures summarized in patient instructions. Scheduled follow up with PCP offered: agrees to follow up as needed Advised to seek prompt in person care if worsening, new symptoms arise, or if is not improving with treatment. Discussed options for inperson care if PCP office not available. Did let this patient know that I only do telemedicine on Tuesdays and Thursdays for [place]. Advised to schedule follow up visit with PCP or [facility] if any further questions or concerns to avoid delays in care.   I discussed the assessment and treatment plan with the patient. The patient was provided an opportunity to ask questions and all were answered. The patient agreed with the plan and demonstrated an understanding of the instructions.     Lucretia Kern, DO

## 2020-06-12 NOTE — Telephone Encounter (Signed)
Per Dr Maudie Mercury, I called Dr Celesta Aver office as she wanted to ask if it is ok for the patient to take [facility] for Covid with his liver disease. Dr Maudie Mercury stated the pt is very anxious about taking anything to harm liver - but does want to take a treatment.  I spoke with Dr [name], he looked up the medication and stated there is no adjustment with liver problems so the patient should be OK to take this.  Dr Maudie Mercury was informed of this information.

## 2020-06-12 NOTE — Patient Instructions (Signed)
HOME CARE TIPS:   -I sent the medication(s) we discussed to your pharmacy: Meds ordered this encounter  Medications  . molnupiravir EUA 200 mg CAPS    Sig: Take 4 capsules (800 mg total) by mouth 2 (two) times daily for 5 days.    Dispense:  40 capsule    Refill:  0  . benzonatate (TESSALON PERLES) 100 MG capsule    Sig: Take 1 capsule (100 mg total) by mouth 3 (three) times daily as needed.    Dispense:  20 capsule    Refill:  0     -I sent in the [place] treatment or referral you requested per our discussion. Please see the information provided below and discuss further with the pharmacist/treatment team.   -can use nasal saline a few times per day if you have nasal congestion; sometimes  a short course of Afrin nasal spray for 3 days can help with symptoms as well  -stay hydrated, drink plenty of fluids and eat small healthy meals - avoid dairy  -can take 1000 IU (78mg) Vit D3 and 100-500 mg of Vit C daily per instructions  -If the Covid test is positive, check out the [facility] for more information on home care, transmission and treatment for COVID19  -follow up with your doctor in 2-3 days unless improving and feeling better  -stay home while sick, except to seek medical care. If you have COVID19, ideally it would be best to stay home for a full 10 days since the onset of symptoms PLUS one day of no fever and feeling better. Wear a good mask that fits snugly (such as N95 or KN95) if around others to reduce the risk of transmission.  It was nice to meet you today, and I really hope you are feeling better soon. I help [place] out with telemedicine visits on Tuesdays and Thursdays and am available for visits on those days. If you have any concerns or questions following this visit please schedule a follow up visit with your Primary Care doctor or seek care at a local urgent care clinic to avoid delays in care.    Seek in person care or schedule a follow up video visit  promptly if your symptoms worsen, new concerns arise or you are not improving with treatment. Call 911 and/or seek emergency care if your symptoms are severe or life threatening.    Fact Sheet for Patients And Caregivers Emergency Use Authorization (EUA) Of LAGEVRIOT (molnupiravir) capsules For Coronavirus Disease 2019 (COVID-19)  What is the most important information I should know about LAGEVRIO? LAGEVRIO may cause serious side effects, including: . LAGEVRIO may cause harm to your unborn baby. It is not known if LAGEVRIO will harm your baby if you take LAGEVRIO during pregnancy. o LAGEVRIO is not recommended for use in pregnancy. o LAGEVRIO has not been studied in pregnancy. LAGEVRIO was studied in pregnant animals only. When LAGEVRIO was given to pregnant animals, LAGEVRIO caused harm to their unborn babies. o You and your healthcare provider may decide that you should take LAGEVRIO during pregnancy if there are no other COVID-19 treatment options approved or authorized by the FDA that are accessible or clinically appropriate for you. o If you and your healthcare provider decide that you should take LAGEVRIO during pregnancy, you and your healthcare provider should discuss the known and potential benefits and the potential risks of taking LAGEVRIO during pregnancy. For individuals who are able to become pregnant: . You should use a reliable method  of birth control (contraception) consistently and correctly during treatment with LAGEVRIO and for 4 days after the last dose of LAGEVRIO. Talk to your healthcare provider about reliable birth control methods. . Before starting treatment with [facility] your healthcare provider may do a pregnancy test to see if you are pregnant before starting treatment with LAGEVRIO. . Tell your healthcare provider right away if you become pregnant or think you may be pregnant during treatment with LAGEVRIO. Pregnancy Surveillance Program: . There is a  pregnancy surveillance program for individuals who take LAGEVRIO during pregnancy. The purpose of this program is to collect information about the health of you and your baby. Talk to your healthcare provider about how to take part in this program. . If you take LAGEVRIO during pregnancy and you agree to participate in the pregnancy surveillance program and allow your healthcare provider to share your information with [place], then your healthcare provider will report your use of [place] during pregnancy to [place]. by calling [phone] or PeacefulBlog.es. For individuals who are sexually active with partners who are able to become pregnant: . It is not known if LAGEVRIO can affect sperm. While the risk is regarded as low, animal studies to fully assess the potential for LAGEVRIO to affect the babies of males treated with LAGEVRIO have not been completed. A reliable method of birth control (contraception) should be used consistently and correctly during treatment with LAGEVRIO and for at least 3 months after the last dose. The risk to sperm beyond 3 months is not known. Studies to understand the risk to sperm beyond 3 months are ongoing. Talk to your healthcare provider about reliable birth control methods. Talk to your healthcare provider if you have questions or concerns about how LAGEVRIO may affect sperm. You are being given this fact sheet because your healthcare provider believes it is necessary to provide you with LAGEVRIO for the treatment of adults with mild-to-moderate coronavirus disease 2019 (COVID-19) with positive results of direct SARS-CoV-2 viral testing, and who are at high risk for progression to severe COVID-19 including hospitalization or death, and for whom other COVID-19 treatment options approved or authorized by the FDA are not accessible or clinically appropriate. The U.S. Food and Drug Administration (FDA) has issued an  Emergency Use Authorization (EUA) to make LAGEVRIO available during the COVID-19 pandemic (for more details about an EUA please see "What is an Emergency Use Authorization?" at the end of this document). LAGEVRIO is not an FDA-approved medicine in the [place]. Read this Fact Sheet for information about LAGEVRIO. Talk to your healthcare provider about your options if you have any questions. It is your choice to take LAGEVRIO.  What is COVID-19? COVID-19 is caused by a virus called a coronavirus. You can get COVID-19 through close contact with another person who has the virus. COVID-19 illnesses have ranged from very mild-to-severe, including illness resulting in death. While information so far suggests that most COVID-19 illness is mild, serious illness can happen and may cause some of your other medical conditions to become worse. Older people and people of all ages with severe, long lasting (chronic) medical conditions like heart disease, lung disease and diabetes, for example seem to be at higher risk of being hospitalized for COVID-19.  What is LAGEVRIO? LAGEVRIO is an investigational medicine used to treat mild-to-moderate COVID-19 in adults: . with positive results of direct SARS-CoV-2 viral testing, and . who are at high risk for progression to severe COVID-19 including  hospitalization or death, and for whom other COVID-19 treatment options approved or authorized by the FDA are not accessible or clinically appropriate. The FDA has authorized the emergency use of LAGEVRIO for the treatment of mild-tomoderate COVID-19 in adults under an EUA. For more information on EUA, see the "What is an Emergency Use Authorization (EUA)?" section at the end of this Fact Sheet. LAGEVRIO is not authorized: . for use in people less than 71 years of age. . for prevention of COVID-19. . for people needing hospitalization for COVID-19. . for use for longer than 5 consecutive days.  What should I  tell my healthcare provider before I take LAGEVRIO? Tell your healthcare provider if you: . Have any allergies . Are breastfeeding or plan to breastfeed . Have any serious illnesses . Are taking any medicines (prescription, over-the-counter, vitamins, or herbal products).  How do I take LAGEVRIO? Marland Kitchen Take LAGEVRIO exactly as your healthcare provider tells you to take it. . Take 4 capsules of LAGEVRIO every 12 hours (for example, at 8 am and at 8 pm) . Take LAGEVRIO for 5 days. It is important that you complete the full 5 days of treatment with LAGEVRIO. Do not stop taking LAGEVRIO before you complete the full 5 days of treatment, even if you feel better. . Take LAGEVRIO with or without food. . You should stay in isolation for as long as your healthcare provider tells you to. Talk to your healthcare provider if you are not sure about how to properly isolate while you have COVID-19. Marland Kitchen Swallow LAGEVRIO capsules whole. Do not open, break, or crush the capsules. If you cannot swallow capsules whole, tell your healthcare provider. . What to do if you miss a dose: o If it has been less than 10 hours since the missed dose, take it as soon as you remember o If it has been more than 10 hours since the missed dose, skip the missed dose and take your dose at the next scheduled time. . Do not double the dose of LAGEVRIO to make up for a missed dose.  What are the important possible side effects of LAGEVRIO? . See, "What is the most important information I should know about LAGEVRIO?" . Allergic Reactions. Allergic reactions can happen in people taking LAGEVRIO, even after only 1 dose. Stop taking LAGEVRIO and call your healthcare provider right away if you get any of the following symptoms of an allergic reaction: o hives o rapid heartbeat o trouble swallowing or breathing o swelling of the mouth, lips, or face o throat tightness o hoarseness o skin rash The most common side effects of  LAGEVRIO are: . diarrhea . nausea . dizziness These are not all the possible side effects of LAGEVRIO. Not many people have taken LAGEVRIO. Serious and unexpected side effects may happen. This medicine is still being studied, so it is possible that all of the risks are not known at this time.  What other treatment choices are there?  Veklury (remdesivir) is FDA-approved as an intravenous (IV) infusion for the treatment of mildto-moderate YPPJK-93 in certain adults and children. Talk with your doctor to see if Marijean Heath is appropriate for you. Like LAGEVRIO, FDA may also allow for the emergency use of other medicines to treat people with COVID-19. Go to LacrosseProperties.si for more information. It is your choice to be treated or not to be treated with LAGEVRIO. Should you decide not to take it, it will not change your standard medical care.  What if I  am breastfeeding? Breastfeeding is not recommended during treatment with LAGEVRIO and for 4 days after the last dose of LAGEVRIO. If you are breastfeeding or plan to breastfeed, talk to your healthcare provider about your options and specific situation before taking LAGEVRIO.  How do I report side effects with LAGEVRIO? Contact your healthcare provider if you have any side effects that bother you or do not go away. Report side effects to FDA MedWatch at SmoothHits.hu or call 1-800-FDA-1088 (1- ([phone]).  How should I store [place]? Marland Kitchen Store LAGEVRIO capsules at room temperature between 1F to 78F (20C to 25C). Marland Kitchen Keep LAGEVRIO and all medicines out of the reach of children and pets. How can I learn more about COVID-19? Marland Kitchen Ask your healthcare provider. . Visit SeekRooms.co.uk . Contact your local or state public health department. . Call [place] at [phone] (toll free in the [place] . Visit  www.molnupiravir.com  What Is an Emergency Use Authorization (EUA)? The [place] FDA has made [place] available under an emergency access mechanism called an Emergency Use Authorization (EUA) The EUA is supported by a Presenter, broadcasting Health and Human Service (HHS) declaration that circumstances exist to justify emergency use of drugs and biological products during the COVID-19 pandemic. LAGEVRIO for the treatment of mild-to-moderate COVID-19 in adults with positive results of direct SARS-CoV-2 viral testing, who are at high risk for progression to severe COVID-19, including hospitalization or death, and for whom alternative COVID-19 treatment options approved or authorized by FDA are not accessible or clinically appropriate, has not undergone the same type of review as an FDA-approved product. In issuing an EUA under the MEQAS-34 public health emergency, the FDA has determined, among other things, that based on the total amount of scientific evidence available including data from adequate and well-controlled clinical trials, if available, it is reasonable to believe that the product may be effective for diagnosing, treating, or preventing COVID-19, or a serious or life-threatening disease or condition caused by COVID-19; that the known and potential benefits of the product, when used to diagnose, treat, or prevent such disease or condition, outweigh the known and potential risks of such product; and that there are no adequate, approved, and available alternatives.  All of these criteria must be met to allow for the product to be used in the treatment of patients during the COVID-19 pandemic. The EUA for LAGEVRIO is in effect for the duration of the COVID-19 declaration justifying emergency use of LAGEVRIO, unless terminated or revoked (after which LAGEVRIO may no longer be used under the EUA). For patent information: http://rogers.info/ Copyright  2021-2022 [employer]  [place], [place] and its affiliates. All rights reserved. [id number] Revised: March 2022

## 2020-06-13 ENCOUNTER — Other Ambulatory Visit (HOSPITAL_COMMUNITY): Payer: Self-pay

## 2020-06-18 DIAGNOSIS — Z20822 Contact with and (suspected) exposure to covid-19: Secondary | ICD-10-CM | POA: Diagnosis not present

## 2020-06-26 ENCOUNTER — Ambulatory Visit (AMBULATORY_SURGERY_CENTER): Payer: Medicare Other | Admitting: Internal Medicine

## 2020-06-26 ENCOUNTER — Encounter: Payer: Self-pay | Admitting: Internal Medicine

## 2020-06-26 ENCOUNTER — Other Ambulatory Visit: Payer: Self-pay

## 2020-06-26 VITALS — BP 131/64 | HR 67 | Temp 98.2°F | Resp 17 | Ht 74.0 in | Wt 246.0 lb

## 2020-06-26 DIAGNOSIS — K7030 Alcoholic cirrhosis of liver without ascites: Secondary | ICD-10-CM | POA: Diagnosis not present

## 2020-06-26 DIAGNOSIS — K7460 Unspecified cirrhosis of liver: Secondary | ICD-10-CM | POA: Diagnosis not present

## 2020-06-26 MED ORDER — SODIUM CHLORIDE 0.9 % IV SOLN: 500.0000 mL | Freq: Once | INTRAVENOUS | Status: DC

## 2020-06-26 NOTE — Progress Notes (Signed)
To PACU, VSS. Report to [email or web address] 

## 2020-06-26 NOTE — Progress Notes (Signed)
Pt's states no medical or surgical changes since previsit or office visit. Covid 3 weeks ago.

## 2020-06-26 NOTE — Patient Instructions (Addendum)
I did not see any varices or dilated veins in the esophagus and that is good news.  The stomach lining was a bit friable - meaning it oozed blood with contact. Unlikely to be a major issue - could be related to cirrhosis.  recheck 1 year  I appreciate the opportunity to care for you. Gatha Mayer, MD, FACG   YOU HAD AN ENDOSCOPIC PROCEDURE TODAY AT [facility]:   Refer to the procedure report that was given to you for any specific questions about what was found during the examination.  If the procedure report does not answer your questions, please call your gastroenterologist to clarify.  If you requested that your care partner not be given the details of your procedure findings, then the procedure report has been included in a sealed envelope for you to review at your convenience later.  YOU SHOULD EXPECT: Some feelings of bloating in the abdomen. Passage of more gas than usual.  Walking can help get rid of the air that was put into your GI tract during the procedure and reduce the bloating. If you had a lower endoscopy (such as a colonoscopy or flexible sigmoidoscopy) you may notice spotting of blood in your stool or on the toilet paper. If you underwent a bowel prep for your procedure, you may not have a normal bowel movement for a few days.  Please Note:  You might notice some irritation and congestion in your nose or some drainage.  This is from the oxygen used during your procedure.  There is no need for concern and it should clear up in a day or so.  SYMPTOMS TO REPORT IMMEDIATELY:    Following upper endoscopy (EGD)  Vomiting of blood or coffee ground material  New chest pain or pain under the shoulder blades  Painful or persistently difficult swallowing  New shortness of breath  Fever of 100F or higher  Black, tarry-looking stools  For urgent or emergent issues, a gastroenterologist can be reached at any hour by calling [phone]. Do not use MyChart  messaging for urgent concerns.    DIET:  We do recommend a small meal at first, but then you may proceed to your regular diet.  Drink plenty of fluids but you should avoid alcoholic beverages for 24 hours.  ACTIVITY:  You should plan to take it easy for the rest of today and you should NOT DRIVE or use heavy machinery until tomorrow (because of the sedation medicines used during the test).    FOLLOW UP: Our staff will call the number listed on your records 48-72 hours following your procedure to check on you and address any questions or concerns that you may have regarding the information given to you following your procedure. If we do not reach you, we will leave a message.  We will attempt to reach you two times.  During this call, we will ask if you have developed any symptoms of COVID 19. If you develop any symptoms (ie: fever, flu-like symptoms, shortness of breath, cough etc.) before then, please call [phone].  If you test positive for Covid 19 in the 2 weeks post procedure, please call and report this information to [place].    If any biopsies were taken you will be contacted by phone or by letter within the next 1-3 weeks.  Please call us at [phone] if you have not heard about the biopsies in 3 weeks.    SIGNATURES/CONFIDENTIALITY: You and/or your care partner  have signed paperwork which will be entered into your electronic medical record.  These signatures attest to the fact that that the information above on your After Visit Summary has been reviewed and is understood.  Full responsibility of the confidentiality of this discharge information lies with you and/or your care-partner.

## 2020-06-26 NOTE — Op Note (Signed)
[facility] Patient Name: Patrick Kidd Procedure Date: 06/26/2020 9:36 AM MRN: [id number] Endoscopist: Gatha Mayer , MD Age: 75 Referring MD:  Date of Birth: [birth year] Gender: Male Account #: [id number] Procedure:                Upper GI endoscopy Indications:              Cirrhosis rule out esophageal varices Medicines:                Propofol per Anesthesia, Monitored Anesthesia Care Procedure:                Pre-Anesthesia Assessment:                           - Prior to the procedure, a History and Physical                            was performed, and patient medications and                            allergies were reviewed. The patient's tolerance of                            previous anesthesia was also reviewed. The risks                            and benefits of the procedure and the sedation                            options and risks were discussed with the patient.                            All questions were answered, and informed consent                            was obtained. Prior Anticoagulants: The patient has                            taken no previous anticoagulant or antiplatelet                            agents. ASA Grade Assessment: III - A patient with                            severe systemic disease. After reviewing the risks                            and benefits, the patient was deemed in                            satisfactory condition to undergo the procedure.                           After obtaining informed consent, the endoscope was  passed under direct vision. Throughout the                            procedure, the patient's blood pressure, pulse, and                            oxygen saturations were monitored continuously. The                            Endoscope was introduced through the mouth, and                            advanced to the second part of duodenum. The upper                             GI endoscopy was accomplished without difficulty.                            The patient tolerated the procedure well. Scope In: Scope Out: Findings:                 The examined esophagus was normal.                           Diffuse mucosal changes characterized by friability                            (with contact bleeding) were found in the cardia,                            in the gastric fundus and in the gastric body.                           The exam was otherwise without abnormality.                           The cardia and gastric fundus were normal on                            retroflexion. Complications:            No immediate complications. Estimated Blood Loss:     Estimated blood loss was minimal. Impression:               - Normal esophagus.                           - Friable (with contact bleeding) mucosa in the                            cardia, gastric fundus and gastric body. ? portal                            gastropathy                           -  The examination was otherwise normal.                           - No specimens collected. Recommendation:           - Patient has a contact number available for                            emergencies. The signs and symptoms of potential                            delayed complications were discussed with the                            patient. Return to normal activities tomorrow.                            Written discharge instructions were provided to the                            patient.                           - Resume previous diet.                           - Continue present medications.                           - Repeat upper endoscopy in 1 year for screening                            purposes. Gatha Mayer, MD 06/26/2020 10:00:37 AM This report has been signed electronically.

## 2020-06-26 NOTE — Progress Notes (Signed)
C.W. vital signs. 

## 2020-06-28 ENCOUNTER — Telehealth: Payer: Self-pay

## 2020-06-28 NOTE — Telephone Encounter (Signed)
Left message on answering machine. 

## 2020-07-12 ENCOUNTER — Telehealth: Payer: Self-pay

## 2020-07-12 DIAGNOSIS — K7030 Alcoholic cirrhosis of liver without ascites: Secondary | ICD-10-CM

## 2020-07-12 NOTE — Telephone Encounter (Signed)
Patient returned a call on my voicemail about scheduling abdominal ultrasound.  I left him a message that he will be contacted directly by [facility] scheduling to help him arrange [place]. He is asked to call back for any questions or concerns.

## 2020-07-23 ENCOUNTER — Telehealth: Payer: Medicare Other

## 2020-07-23 ENCOUNTER — Telehealth: Payer: Self-pay | Admitting: Pharmacist

## 2020-07-23 NOTE — Telephone Encounter (Signed)
  Chronic Care Management   Outreach Note  07/23/2020 Name: QAADIR KENT MRN: [id number] DOB: 25-Aug-1945  Referred by: [name], MD  Patient had a phone appointment scheduled with clinical pharmacist today.  An unsuccessful telephone outreach was attempted today. The patient was referred to the pharmacist for assistance with medications, care management and care coordination.   Patient will NOT be penalized in any way for missing a CCM appointment. The no-show fee does not apply.  If possible, a message was left to return call to: [phone] or to [facility]: [place], PharmD, Para March, CPP Clinical Pharmacist [facility] [phone]

## 2020-07-23 NOTE — Progress Notes (Deleted)
Chronic Care Management Pharmacy Note  07/23/2020 Name:  Patrick Kidd MRN:  [id number] DOB:  [birth year]  Summary:   Recommendations/Changes made from today's visit:   Plan:   Subjective: Patrick Kidd is an 75 y.o. year old male who is a primary patient of [name], MD.  The CCM team was consulted for assistance with disease management and care coordination needs.    Engaged with patient by telephone for follow up visit in response to provider referral for pharmacy case management and/or care coordination services.   Consent to Services:  The patient was given information about Chronic Care Management services, agreed to services, and gave verbal consent prior to initiation of services.  Please see initial visit note for detailed documentation.   Patient Care Team: [name], MD as PCP - General (Internal Medicine) Lorretta Harp, MD as PCP - Cardiology (Cardiology) Pieter Partridge, DO as Consulting Physician (Neurology) Charlton Haws, [facility] as Pharmacist (Pharmacist)  Recent office visits: 03/20/20 NP Jodi Mourning VV: sinus infection, rx'd Augmentin and Flonase. PCP sent Cialis following week for ED.  03/14/20 Dr Ronnald Ramp OV: CPE. A1c down to 7.5%. GFR declined. C/o headaches, ordered MRI which was normal.  11/23/19 Dr Ronnald Ramp OV: CPE. A1c up to 8.4%. Started Wilder Glade, Kirke Shaggy. Increased Toujeo to 50 units.  Recent consult visits: 06/26/20 endoscopy - no varices or dilated veins. Friable stomach lining.  06/12/20 Dr Maudie Mercury ([facility]): covid-19 positive, rx'd molnupiravir and benzonatate  05/23/20 Dr [name] (GI): f/u cirrhosis. C-P score 5 - no ascites or encephalopathy, has been medicated for these. Scheduled EGD to screen for variceal bleeding. Off SBP prophylaxis due to side effects. Continue lactulose d/t hx encephalopathy.  03/12/20 Dr Lovena Le (cardiology): f/u Afib, cirrhosis. Not a candidate for anticoagulation d/t cirrhosis. No Afb symptoms since  hospitalization  08/26/19 Dr Tomi Likens (neurology): diabetic neuropathy, gabapentin refilled.  07/19/19 Dr Lovena Le (cardiology): stopped lisinopril due to good BP control, stopped statin due to cirrhosis.  Hospital visits: None in previous 6 months  Objective:  Lab Results  Component Value Date   CREATININE 1.41 03/14/2020   BUN 20 03/14/2020   GFR 48.79 (L) 03/14/2020   GFRNONAA >60 02/06/2019   GFRAA >60 02/06/2019   NA 139 03/14/2020   K 4.6 03/14/2020   CALCIUM 9.5 03/14/2020   CO2 31 03/14/2020   GLUCOSE 120 (H) 03/14/2020    Lab Results  Component Value Date/Time   HGBA1C 7.5 (H) 03/14/2020 01:48 PM   HGBA1C 8.4 (H) 11/16/2019 11:35 AM   GFR 48.79 (L) 03/14/2020 01:48 PM   GFR 63.88 11/16/2019 11:36 AM   MICROALBUR 3.4 (H) 03/14/2019 10:36 AM   MICROALBUR 1.3 10/10/2016 08:29 AM    Last diabetic Eye exam:  Lab Results  Component Value Date/Time   HMDIABEYEEXA No Retinopathy 04/26/2020 02:11 PM    Last diabetic Foot exam: No results found for: HMDIABFOOTEX   Lab Results  Component Value Date   CHOL 186 03/14/2020   HDL 54.20 03/14/2020   LDLCALC 114 (H) 03/14/2020   TRIG 93.0 03/14/2020   CHOLHDL 3 03/14/2020    Hepatic Function Latest Ref Rng & Units 03/14/2020 11/16/2019 06/09/2019  Total Protein 6.0 - 8.3 g/dL 6.9 7.0 6.8  Albumin 3.5 - 5.2 g/dL 4.1 4.2 4.0  AST 0 - 37 U/L 16 16 17   ALT 0 - 53 U/L 11 12 12   Alk Phosphatase 39 - 117 U/L 67 76 59  Total Bilirubin 0.2 - 1.2  mg/dL 1.0 0.9 1.0  Bilirubin, Direct 0.0 - 0.3 mg/dL 0.2 - 0.2    Lab Results  Component Value Date/Time   TSH 1.92 03/14/2020 01:48 PM   TSH 1.830 01/29/2019 05:00 AM   TSH 1.63 11/11/2017 11:48 AM   FREET4 1.08 03/10/2014 02:07 PM    CBC Latest Ref Rng & Units 03/14/2020 11/16/2019 06/09/2019  WBC 4.0 - 10.5 K/uL 7.8 7.2 6.9  Hemoglobin 13.0 - 17.0 g/dL 15.8 14.4 12.9(L)  Hematocrit 39.0 - 52.0 % 45.9 41.4 37.2(L)  Platelets 150.0 - 400.0 K/uL 85.0(L) 89.0(L) 99.0(L)   No results  found for: VD25OH  Clinical ASCVD: No  The 10-year ASCVD risk score Mikey Bussing DC Jr., et al., 2013) is: 47.1%   Values used to calculate the score:     Age: 49 years     Sex: Male     Is Non-Hispanic African American: No     Diabetic: Yes     Tobacco smoker: No     Systolic Blood Pressure: 834 mmHg     Is BP treated: Yes     HDL Cholesterol: 54.2 mg/dL     Total Cholesterol: 186 mg/dL    Depression screen [facility] 2/9 04/14/2019 03/14/2019 10/08/2017  Decreased Interest 0 0 0  Down, Depressed, Hopeless 0 0 0  PHQ - 2 Score 0 0 0  Some recent data might be hidden     Social History   Tobacco Use  Smoking Status Former   Packs/day: 2.00   Years: 6.00   Pack years: 12.00   Types: Cigarettes   Quit date: 02/04/1971   Years since quitting: 49.4  Smokeless Tobacco Never  Tobacco Comments   smoked age [age], up to 2 ppd   BP Readings from Last 3 Encounters:  06/26/20 131/64  05/23/20 (!) 180/80  03/14/20 114/68   Pulse Readings from Last 3 Encounters:  06/26/20 67  05/23/20 81  03/14/20 81   Wt Readings from Last 3 Encounters:  06/26/20 246 lb (111.6 kg)  05/23/20 246 lb (111.6 kg)  03/14/20 242 lb 6.4 oz (110 kg)   BMI Readings from Last 3 Encounters:  06/26/20 31.58 kg/m  05/23/20 31.58 kg/m  03/14/20 31.12 kg/m    Assessment/Interventions: Review of patient past medical history, allergies, medications, health status, including review of consultants reports, laboratory and other test data, was performed as part of comprehensive evaluation and provision of chronic care management services.   SDOH:  (Social Determinants of Health) assessments and interventions performed: Yes    CCM Care Plan  Allergies  Allergen Reactions   Rifaximin Nausea And Vomiting    Required EMS visit, although patient did not go to hospital   Beta Adrenergic Blockers     Likely WPW.  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  Use with caution.    Medications Reviewed  Today     Reviewed by Laretta Alstrom, CRNA (Certified Registered Nurse Anesthetist) on 06/26/20 at [phone]  Med List Status: <None>   Medication Order Taking? Sig Documenting Provider Last Dose Status Informant  0.9 %  sodium chloride infusion [id number]   Gatha Mayer, MD  Active   benzonatate (TESSALON PERLES) 100 MG capsule [id number] Yes Take 1 capsule (100 mg total) by mouth 3 (three) times daily as needed. Lucretia Kern, DO 06/25/2020 Active   blood glucose meter kit and supplies KIT [id number] Yes Use to test blood sugar once daily. DX E11.8 Ronnald Ramp,  Arvid Right, MD Past Week Active Self  Blood Glucose Monitoring Suppl (FREESTYLE FREEDOM LITE) w/Device KIT [id number] Yes Use to check blood sugars 2 times daily [name], FNP Past Week Active Self  Colchicine (MITIGARE) 0.6 MG CAPS [id number] No Take 1 tablet by mouth daily. [name], MD Unknown Active            Med Note Forde Dandy, MELISSA I   Wed Nov 16, 2019 10:47 AM) PRN  dapagliflozin propanediol (FARXIGA) 10 MG TABS tablet [id number] Yes Take 1 tablet (10 mg total) by mouth daily before breakfast. [name], MD 06/25/2020 Active   fluticasone (FLONASE) 50 MCG/ACT nasal spray [id number] No SPRAY 2 SPRAYS INTO EACH NOSTRIL EVERY DAY [name], MD Unknown Active   folic acid (FOLVITE) 1 MG tablet [id number] No TAKE 1 TABLET EVERY DAY [name], MD Unknown Active   furosemide (LASIX) 20 MG tablet [id number] Yes TAKE 1 TABLET(20 MG) BY MOUTH DAILY [name], MD 06/25/2020 Active   gabapentin (NEURONTIN) 300 MG capsule [id number] Yes Take 1 capsule (300 mg total) by mouth 2 (two) times daily. Pieter Partridge, DO 06/25/2020 Active   glucose blood (COOL BLOOD GLUCOSE TEST STRIPS) test strip [id number] Yes Use to check blood sugar daily. DX: E11.8 [name], MD Past Week Active   insulin glargine, 2 Unit Dial, (TOUJEO MAX SOLOSTAR) 300 UNIT/ML Solostar Pen [id number] Yes Inject 50 Units into the skin daily. [name],  MD 06/25/2020 Active   KLOR-CON M20 20 MEQ tablet [id number] No TAKE 1 TABLET BY MOUTH EVERY DAY [name], MD Unknown Active   lactulose (CHRONULAC) 10 GM/15ML solution [id number] Yes TAKE 15 MLS (10 G TOTAL) BY MOUTH 2 (TWO) TIMES DAILY. Gatha Mayer, MD 06/25/2020 Active   Lancets (ACCU-CHEK MULTICLIX) lancets [id number] Yes Use to check sugar daily. DX: E11.8 [name], MD Past Week Active Self  metFORMIN (GLUCOPHAGE) 500 MG tablet [id number] Yes TAKE 1 TABLET BY MOUTH 2 TIMES DAILY WITH A MEAL. [name], MD 06/25/2020 Active   Multiple Vitamin (MULTIVITAMIN WITH MINERALS) TABS tablet [id number] No Take 1 tablet by mouth daily. Shelly Coss, MD Unknown Active   tadalafil (CIALIS) 20 MG tablet [id number] No Take 1 tablet (20 mg total) by mouth daily as needed for erectile dysfunction. [name], MD Unknown Active   thiamine 100 MG tablet [id number] No Take 1 tablet (100 mg total) by mouth every other day.  Patient taking differently: Take 200 mg by mouth daily.   [name], MD Unknown Active             Patient Active Problem List   Diagnosis Date Noted   Primary stabbing headache 03/15/2020   Stage 3a chronic kidney disease ([place]) 03/15/2020   Nocturnal headaches 03/14/2020   Thiamine deficiency    Paroxysmal atrial fibrillation ([place]) 07/19/2019   Allergic rhinitis 06/09/2019   Erectile dysfunction due to diabetes mellitus ([place]) 06/09/2019   Overweight with body mass index (BMI) of 28 to 28.9 in adult 03/17/2019   WPW (Wolff-Parkinson-White syndrome) 02/05/2019   SBP (spontaneous bacterial peritonitis) (HCC)-December 2020,     Encephalopathy, hepatic ([place])    Degenerative disc disease, cervical 07/21/2018   Cervical radiculopathy 07/16/2018   OAB (overactive bladder) 07/06/2018   Malignant neoplasm of prostate ([place]) 01/13/2018   Hyperlipidemia LDL goal <100 11/11/2017   Essential hypertension 11/11/2017   BPH associated with nocturia 11/11/2017    Erectile dysfunction due  to arterial insufficiency 04/07/2017   Peripheral vascular disease ([place]) 69/67/8938   Alcoholic cirrhosis ([place]) 11/19/5100   Type 2 diabetes mellitus with complication, with long-term current use of insulin ([place]) 08/22/2015   Hypersomnolence 03/19/2014   Peripheral neuropathy 01/11/2013   Polyclonal gammopathy 01/11/2013   GERD 10/04/2009    Immunization History  Administered Date(s) Administered   Fluad Quad(high Dose 65+) 01/30/2019, 11/23/2019   Hep A / Hep B 11/02/2015   Influenza, High Dose Seasonal PF 10/28/2016, 11/11/2017   Influenza,inj,Quad PF,6+ Mos 01/10/2016   Moderna SARS-COV2 Booster Vaccination 12/13/2019   Moderna Sars-Covid-2 Vaccination 02/13/2019, 03/16/2019   Pneumococcal Conjugate-13 09/04/2015   Pneumococcal Polysaccharide-23 06/05/2011, 03/14/2019   Tdap 06/03/2009, 10/28/2016, 06/28/2019    Conditions to be addressed/monitored:  Hyperlipidemia, Diabetes and Cirrhosis  Patient Care Plan: CCM Pharmacy Care Plan     Problem Identified: Hyperlipidemia, Diabetes and Cirrhosis   Priority: High     Long-Range Goal: Disease management   Start Date: 04/19/2020  Expected End Date: 10/20/2020  This Visit's Progress: On track  Priority: High  Note:   Current Barriers:  Unable to independently afford treatment regimen Unable to independently monitor therapeutic efficacy Unable to maintain control of diabetes  Pharmacist Clinical Goal(s):  Patient will verbalize ability to afford treatment regimen achieve adherence to monitoring guidelines and medication adherence to achieve therapeutic efficacy maintain control of diabetes as evidenced by improved A1c  through collaboration with PharmD and provider.   Interventions: 1:1 collaboration with [name], MD regarding development and update of comprehensive plan of care as evidenced by provider attestation and co-signature Inter-disciplinary care team collaboration (see  longitudinal plan of care) Comprehensive medication review performed; medication list updated in electronic medical record  Hyperlipidemia (LDL goal < 100) Not ideally controlled - pt was previously taking atorvastatin, but this was discontinued in summer 2021 by cardiologist due to cirrhosis diagnosis. LDL increased from 81 to 114 since stopping statin. LFTs, ammonia levels have been stable in normal range. Current regimen:  No medications Interventions: Discussed risks/benefits of statin use in context of cirrhosis. Recent data supports use of statins in cirrhosis -  Large retrospective cohort studies have shown reduced risk of disease progression, hepatic decompensation, hepatocellular carcinoma development, and death Recommend to discuss restarting statin with GI and/or cardiology  Diabetes (A1c goal < 7%) Not ideally controlled - patient reports fasting BG 120-150, checking once a week. He denies hypoglycemia (ever). Current regimen:  Toujeo Max 50 units daily (samples) Metformin 500 mg BID Farxiga 10 mg daily (via PAP) Interventions: Discussed A1c goal and benefits of medications Discussed progression of diabetes and low likelihood of control without insulin Pursue patient assistance for Toujeo [employer]) Recommend to continue current medication and check BG daily  Cirrhosis Controlled - per notes, currently well compensated. Patient is abstaining from alcohol. He is not taking lactulose. Current regimen:  Lactulose 10 gm/31mL twice a day - not taking Furosemide 20 mg daily Potassium chloride 20 mEq daily Interventions: Discussed benefit of lactulose for removal of ammonium and treatment of hepatic encephalopathy - patient has not been taking since his first Rx ran out Discussed benefit of furosemide for fluid removal and prevention of ascites Furosemide causes potassium loss in the urine, so potassium supplement is needed with furosemide Recommended to follow up with  gastroenterologist regarding necessity of lactulose  Patient Goals/Self-Care Activities Patient will:  - take medications as prescribed focus on medication adherence by routine check glucose daily, document, and provide at future appointments  -  Contact Dr Celesta Aver office to make follow up appt to discuss Lactulose and statin   Follow Up Plan: Telephone follow up appointment with care management team member scheduled for: 3 months      Medication Assistance:  Farxiga (AZ&Me) - approved through 02/02/21 Toujeo ([employer]) - approved through 02/02/21  Compliance/Adherence/Medication fill history: Care Gaps: Shingrix Covid booster (due 03/14/20) Foot exam Urine microalbumin  Star-Rating Drugs: Metformin - LF 06/29/20  Patient's preferred pharmacy is:  [id number] [place] [place]Phone: [phone]: [address]Phone: [phone]: [phone] [facility] [address] [place]Phone: [phone]: [phone] [facility] [address]Phone: [phone]: [phone] [address] [place] [place]Phone: [phone]: [phone] Uses pill box? No - prefers bottles Pt endorses 100% compliance  We discussed: Current pharmacy is preferred with insurance plan and patient is satisfied with pharmacy services Patient decided to: Continue current medication management strategy  Care Plan and Follow Up Patient Decision:  Patient agrees to Care Plan and Follow-up.  Plan: Telephone follow up appointment with care  management team member scheduled for:  3 months  LCharlene Brooke PharmD, BE[place] CPP Clinical Pharmacist LLeelanauPrimary [facility] at [phone]

## 2020-07-24 ENCOUNTER — Other Ambulatory Visit: Payer: Self-pay

## 2020-07-24 ENCOUNTER — Ambulatory Visit (HOSPITAL_COMMUNITY)
Admission: RE | Admit: 2020-07-24 | Discharge: 2020-07-24 | Disposition: A | Discharge status: Home / Self Care | Payer: Medicare Other | Source: Ambulatory Visit | Attending: Internal Medicine | Admitting: Internal Medicine

## 2020-07-24 DIAGNOSIS — K7460 Unspecified cirrhosis of liver: Secondary | ICD-10-CM | POA: Diagnosis not present

## 2020-07-24 DIAGNOSIS — K7030 Alcoholic cirrhosis of liver without ascites: Secondary | ICD-10-CM | POA: Insufficient documentation

## 2020-07-24 DIAGNOSIS — R161 Splenomegaly, not elsewhere classified: Secondary | ICD-10-CM | POA: Diagnosis not present

## 2020-07-24 DIAGNOSIS — K7689 Other specified diseases of liver: Secondary | ICD-10-CM | POA: Diagnosis not present

## 2020-08-01 ENCOUNTER — Other Ambulatory Visit: Payer: Self-pay

## 2020-08-01 DIAGNOSIS — R933 Abnormal findings on diagnostic imaging of other parts of digestive tract: Secondary | ICD-10-CM

## 2020-08-01 DIAGNOSIS — K7031 Alcoholic cirrhosis of liver with ascites: Secondary | ICD-10-CM

## 2020-08-01 DIAGNOSIS — N2889 Other specified disorders of kidney and ureter: Secondary | ICD-10-CM

## 2020-08-01 NOTE — Progress Notes (Signed)
M

## 2020-08-05 ENCOUNTER — Telehealth: Payer: Self-pay | Admitting: Internal Medicine

## 2020-08-06 ENCOUNTER — Other Ambulatory Visit: Payer: Self-pay | Admitting: Internal Medicine

## 2020-08-07 NOTE — Telephone Encounter (Signed)
   Patient states he has no medication remaining. Patient declined appointment for next week buffer slot He was given a 5 day supply of Pantoprazole. Patient wants to know if he can completely stop medication  Requesting call

## 2020-08-08 ENCOUNTER — Other Ambulatory Visit: Payer: Self-pay | Admitting: Internal Medicine

## 2020-08-08 DIAGNOSIS — R053 Chronic cough: Secondary | ICD-10-CM

## 2020-08-08 DIAGNOSIS — K219 Gastro-esophageal reflux disease without esophagitis: Secondary | ICD-10-CM

## 2020-08-08 MED ORDER — PANTOPRAZOLE SODIUM 40 MG PO TBEC: DELAYED_RELEASE_TABLET | ORAL | 0 refills | Status: DC

## 2020-08-08 NOTE — Telephone Encounter (Signed)
I assume that Rx would still be denied since he did not schedule an OV. Would it be ok for pt to be off med? Please advise on below msg.

## 2020-08-14 ENCOUNTER — Ambulatory Visit (HOSPITAL_COMMUNITY)
Admission: RE | Admit: 2020-08-14 | Discharge: 2020-08-14 | Disposition: A | Discharge status: Home / Self Care | Payer: Medicare Other | Source: Ambulatory Visit | Attending: Internal Medicine | Admitting: Internal Medicine

## 2020-08-14 ENCOUNTER — Other Ambulatory Visit: Payer: Self-pay

## 2020-08-14 DIAGNOSIS — K766 Portal hypertension: Secondary | ICD-10-CM | POA: Diagnosis not present

## 2020-08-14 DIAGNOSIS — R933 Abnormal findings on diagnostic imaging of other parts of digestive tract: Secondary | ICD-10-CM | POA: Diagnosis not present

## 2020-08-14 DIAGNOSIS — K7689 Other specified diseases of liver: Secondary | ICD-10-CM | POA: Diagnosis not present

## 2020-08-14 DIAGNOSIS — K8020 Calculus of gallbladder without cholecystitis without obstruction: Secondary | ICD-10-CM | POA: Diagnosis not present

## 2020-08-14 DIAGNOSIS — K7031 Alcoholic cirrhosis of liver with ascites: Secondary | ICD-10-CM | POA: Diagnosis not present

## 2020-08-14 DIAGNOSIS — N2889 Other specified disorders of kidney and ureter: Secondary | ICD-10-CM

## 2020-08-14 DIAGNOSIS — K7460 Unspecified cirrhosis of liver: Secondary | ICD-10-CM | POA: Diagnosis not present

## 2020-08-14 MED ORDER — GADOBUTROL 1 MMOL/ML IV SOLN
10.0000 mL | Freq: Once | INTRAVENOUS | Status: AC | PRN
  Administered 2020-08-14: 10 mL via INTRAVENOUS

## 2020-08-17 ENCOUNTER — Telehealth: Payer: Self-pay | Admitting: Pharmacist

## 2020-08-17 NOTE — Chronic Care Management (AMB) (Signed)
Chronic Care Management Pharmacy Assistant   Name: Patrick Kidd  MRN: [id number] DOB: April 04, 1945  Reason for Encounter: Disease State - Diabetes  Recent office visits:  None noted.  Recent consult visits: 06/18/20 - Paulino Door, NP - ([facility]) COVID 19 test performed.  06/12/20 [place] 19 - prescription sent to [facility] and Molnupiravir 800 mg . Follow up as needed.  Hospital visits:  None in previous 6 months  Medications: Outpatient Encounter Medications as of 08/17/2020  Medication Sig Note   blood glucose meter kit and supplies KIT Use to test blood sugar once daily. DX E11.8    Blood Glucose Monitoring Suppl (FREESTYLE FREEDOM LITE) w/Device KIT Use to check blood sugars 2 times daily    Colchicine (MITIGARE) 0.6 MG CAPS Take 1 tablet by mouth daily. 11/16/2019: PRN   dapagliflozin propanediol (FARXIGA) 10 MG TABS tablet Take 1 tablet (10 mg total) by mouth daily before breakfast.    fluticasone (FLONASE) 50 MCG/ACT nasal spray SPRAY 2 SPRAYS INTO EACH NOSTRIL EVERY DAY    folic acid (FOLVITE) 1 MG tablet TAKE 1 TABLET EVERY DAY    furosemide (LASIX) 20 MG tablet TAKE 1 TABLET(20 MG) BY MOUTH DAILY    gabapentin (NEURONTIN) 300 MG capsule Take 1 capsule (300 mg total) by mouth 2 (two) times daily.    glucose blood (COOL BLOOD GLUCOSE TEST STRIPS) test strip Use to check blood sugar daily. DX: E11.8    insulin glargine, 2 Unit Dial, (TOUJEO MAX SOLOSTAR) 300 UNIT/ML Solostar Pen Inject 50 Units into the skin daily.    KLOR-CON M20 20 MEQ tablet TAKE 1 TABLET BY MOUTH EVERY DAY    lactulose (CHRONULAC) 10 GM/15ML solution TAKE 15 MLS (10 G TOTAL) BY MOUTH 2 (TWO) TIMES DAILY.    Lancets (ACCU-CHEK MULTICLIX) lancets Use to check sugar daily. DX: E11.8    metFORMIN (GLUCOPHAGE) 500 MG tablet TAKE 1 TABLET BY MOUTH 2 TIMES DAILY WITH A MEAL.    Multiple Vitamin (MULTIVITAMIN WITH MINERALS) TABS tablet Take 1 tablet by mouth daily.     pantoprazole (PROTONIX) 40 MG tablet Take 1 tablet 30 minutes before meals (breakfast and supper) twice a day    tadalafil (CIALIS) 20 MG tablet Take 1 tablet (20 mg total) by mouth daily as needed for erectile dysfunction.    thiamine 100 MG tablet Take 1 tablet (100 mg total) by mouth every other day. (Patient taking differently: Take 200 mg by mouth daily.)    No facility-administered encounter medications on file as of 08/17/2020.    Recent Relevant Labs: Lab Results  Component Value Date/Time   HGBA1C 7.5 (H) 03/14/2020 01:48 PM   HGBA1C 8.4 (H) 11/16/2019 11:35 AM   MICROALBUR 3.4 (H) 03/14/2019 10:36 AM   MICROALBUR 1.3 10/10/2016 08:29 AM    Kidney Function Lab Results  Component Value Date/Time   CREATININE 1.41 03/14/2020 01:48 PM   CREATININE 1.12 11/16/2019 11:36 AM   CREATININE 1.13 06/04/2010 01:54 PM   GFR 48.79 (L) 03/14/2020 01:48 PM   GFRNONAA >60 02/06/2019 05:46 AM   GFRAA >60 02/06/2019 05:46 AM    Current antihyperglycemic regimen:  Metformin 500 mg  Toujeo Max Solostar 300/unit ml Farxiga 10 mg 1 tab daily  What recent interventions/DTPs have been made to improve glycemic control:  Patient reported no current changes  Have there been any recent hospitalizations or ED visits since last visit with CPP? None noted.  Patient denies hypoglycemic symptoms,  including sweats, shakiness, dizziness.  Patient denies hyperglycemic symptoms, including headache, dizziness, sweats.  How often are you checking your blood sugar?  Once a week  What are your blood sugars ranging?  Fasting: 118-130 Before meals: not checking at this time. After meals: 160-170 Bedtime: not checking at bedtime  During the week, how often does your blood glucose drop below 70? Never  Are you checking your feet daily/regularly? Patient reports he has been checking his feet regularly. No current sores ulcers on his feet.   Adherence Review: Is the patient currently on a STATIN  medication? No Is the patient currently on ACE/ARB medication? No Does the patient have >5 day gap between last estimated fill dates? No Metformin  Toujeo Max Solostar-patient assistance [employer] Drugs: Metformin 500 mg - last filled 06/29/20 90 days Farxiga 10 mg 1 tab daily last filled - receiving through PAP  * Patient states he received a letter from PAP being approved for Toujeo but it has been sent to the office. He states he wishes to get it sent to his home if possible.   Orinda Kenner, [place] Clinical Pharmacists Assistant [phone]  Time Spent:40

## 2020-08-24 NOTE — Progress Notes (Signed)
NEUROLOGY FOLLOW UP OFFICE NOTE  Patrick Kidd [id number]  Assessment/Plan:   Polyneuropathy, diabetic  To address bilateral leg aches, will increase gabapentin 337m to three times daily Follow up one year or as needed.  Subjective:  Patrick Kidd"BGaspar Bidding Patrick Kidd a 75y.o. right-handed male who follows up for diabetic polyneuropathy.  Hospital records reviewed.   UPDATE: He is taking gabapentin 3021mtwice daily.     Labs from February demonstrated Hgb A1c 7.5 and B1 of 13.  Legs are constantly sore and tired (including hips, thighs,calves and knees).  Walks 100 laps in the pool 3 to 4 times a week.     HISTORY: Onset of symptoms began in 2010.  He describes initially experiencing numbness on the bottom of both feet, in which recently it has spread to the ankles. It is also associated with an uncomfortable tingling sensation. It is not painful but it is uncomfortable and usually bothers him at night.  He doesn't really notice it if he is active.  He is concerned about his balance.  The first time, he was getting out of a boat and felt that his right leg was not strong enough to hold his weight and he fell to his knees. The second time, he tripped over a chair and fell down to his knees.  He felt that his leg just gave out when it paired his weight. He is particularly concerned, because he does enjoy riding on a boat and dancing.  More recently, he stood up from a chair at a game and felt shaky and unsteady.  He has not been exercising as much due to fear of imbalance.  He denies any pain radiating down the legs.  He has pain in his right big toe after an 8 pound frozen duck breast fell on it.  He has tingling in the finger tips.   Labs from 08/04/12 revealed ESR 29, ANA negative, Lyme negative.  SPEP revealed possible faint restricted band in the gamma region, IFE revealed polyclonal gammopathy involving IgA and IgM.  Prior labs last month revealed Hgb A1c 5.7, TSH 2.42, RPR  non-reactive, serum glucose 131.  CBC and CMP otherwise unremarkable.  B12 from 5/12 was 1293.  Fasting glucose was 143 and 2 hour glucose tolerance test was 341.  However, he was diagnosed with type 2 diabetes after Hgb A1c from 01/09/16 was found to be 8.4.  PAST MEDICAL HISTORY: Past Medical History:  Diagnosis Date   Acrophobia    Alcoholic cirrhosis (HCL[place]8/9/50/9326 Anemia    Arthritis    foot by big toe   BPH associated with nocturia    Cataract    removed both eyes   Chronic cough    PMH of   COVID-19 12/2018   [facility]   Diverticulosis 07-03-2010   Colonoscopy.    Duodenal ulcer 2017   Fallen arches    Bilateral   GERD (gastroesophageal reflux disease)    Gout    Granuloma annulare    Hx of adenomatous colonic polyps multiple   Hydrocele 2011   Large septated right hydrocele   Liver cyst    Liver lesion    Nonspecific elevation of levels of transaminase or lactic acid dehydrogenase (LDH)    Obesity    Peripheral neuropathy    Plantar fasciitis    PMH of   Portal hypertension (HCCast[place]2017   [place]state cancer ([facility]   Right shoulder pain 11/2017  Sleep apnea    no cpap, patient denies   Thiamine deficiency    Wears reading eyeglasses    WPW (Wolff-Parkinson-White syndrome) 02/05/2019    MEDICATIONS: Current Outpatient Medications on File Prior to Visit  Medication Sig Dispense Refill   blood glucose meter kit and supplies KIT Use to test blood sugar once daily. DX E11.8 1 each 0   Blood Glucose Monitoring Suppl (FREESTYLE FREEDOM LITE) w/Device KIT Use to check blood sugars 2 times daily 1 each 1   Colchicine (MITIGARE) 0.6 MG CAPS Take 1 tablet by mouth daily. 90 capsule 0   dapagliflozin propanediol (FARXIGA) 10 MG TABS tablet Take 1 tablet (10 mg total) by mouth daily before breakfast. 112 tablet 0   fluticasone (FLONASE) 50 MCG/ACT nasal spray SPRAY 2 SPRAYS INTO EACH NOSTRIL EVERY DAY 48 mL 1   folic acid (FOLVITE) 1 MG  tablet TAKE 1 TABLET EVERY DAY 90 tablet 1   furosemide (LASIX) 20 MG tablet TAKE 1 TABLET(20 MG) BY MOUTH DAILY 90 tablet 1   gabapentin (NEURONTIN) 300 MG capsule Take 1 capsule (300 mg total) by mouth 2 (two) times daily. 180 capsule 3   glucose blood (COOL BLOOD GLUCOSE TEST STRIPS) test strip Use to check blood sugar daily. DX: E11.8 300 each 3   insulin glargine, 2 Unit Dial, (TOUJEO MAX SOLOSTAR) 300 UNIT/ML Solostar Pen Inject 50 Units into the skin daily. 3 mL 3   KLOR-CON M20 20 MEQ tablet TAKE 1 TABLET BY MOUTH EVERY DAY 90 tablet 1   lactulose (CHRONULAC) 10 GM/15ML solution TAKE 15 MLS (10 G TOTAL) BY MOUTH 2 (TWO) TIMES DAILY. 946 mL 2   Lancets (ACCU-CHEK MULTICLIX) lancets Use to check sugar daily. DX: E11.8 100 each 12   metFORMIN (GLUCOPHAGE) 500 MG tablet TAKE 1 TABLET BY MOUTH 2 TIMES DAILY WITH A MEAL. 180 tablet 0   Multiple Vitamin (MULTIVITAMIN WITH MINERALS) TABS tablet Take 1 tablet by mouth daily.     pantoprazole (PROTONIX) 40 MG tablet Take 1 tablet 30 minutes before meals (breakfast and supper) twice a day 180 tablet 0   tadalafil (CIALIS) 20 MG tablet Take 1 tablet (20 mg total) by mouth daily as needed for erectile dysfunction. 6 tablet 3   thiamine 100 MG tablet Take 1 tablet (100 mg total) by mouth every other day. (Patient taking differently: Take 200 mg by mouth daily.) 45 tablet 1   No current facility-administered medications on file prior to visit.    ALLERGIES: Allergies  Allergen Reactions   Rifaximin Nausea And Vomiting    Required EMS visit, although patient did not go to hospital   Beta Adrenergic Blockers     Likely WPW.  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  Use with caution.    FAMILY HISTORY: Family History  Problem Relation Age of Onset   Lung cancer Mother        smoker   Leukemia Father        Acute myelocytic   Diabetes Neg Hx    Stroke Neg Hx    Heart disease Neg Hx    Colon cancer Neg Hx    Colon  polyps Neg Hx    Esophageal cancer Neg Hx    Rectal cancer Neg Hx    Stomach cancer Neg Hx       Objective:  Blood pressure 138/71, pulse 84, height $RemoveBe'6\' 2"'zfoKEoIcr$  (1.88 m), weight 245 lb 6.4 oz (111.3  kg), SpO2 96 %. General: No acute distress.  Patient appears well-groomed.   Head:  Normocephalic/atraumatic Eyes:  Fundi examined but not visualized Neck: supple, no paraspinal tenderness, full range of motion Heart:  Regular rate and rhythm Lungs:  Clear to auscultation bilaterally Back: No paraspinal tenderness Neurological Exam: alert and oriented to person, place, and time.  Speech fluent and not dysarthric, language intact.  CN II-XII intact. Bulk and tone normal, muscle strength 5/5 throughout.  Sensation to pinprick and vibration reduced up to mid shin.  Deep tendon reflexes 2+ throughout, toes downgoing.  Finger to nose testing intact.  Gait normal, Romberg with sway.   Metta Clines, DO  CC: Scarlette Calico, MD

## 2020-08-27 ENCOUNTER — Encounter: Payer: Self-pay | Admitting: Neurology

## 2020-08-27 ENCOUNTER — Telehealth: Payer: Self-pay

## 2020-08-27 ENCOUNTER — Ambulatory Visit (INDEPENDENT_AMBULATORY_CARE_PROVIDER_SITE_OTHER): Payer: Medicare Other | Admitting: Neurology

## 2020-08-27 ENCOUNTER — Other Ambulatory Visit: Payer: Self-pay

## 2020-08-27 VITALS — BP 138/71 | HR 84 | Ht 74.0 in | Wt 245.4 lb

## 2020-08-27 DIAGNOSIS — E1142 Type 2 diabetes mellitus with diabetic polyneuropathy: Secondary | ICD-10-CM | POA: Diagnosis not present

## 2020-08-27 DIAGNOSIS — Z794 Long term (current) use of insulin: Secondary | ICD-10-CM

## 2020-08-27 DIAGNOSIS — E118 Type 2 diabetes mellitus with unspecified complications: Secondary | ICD-10-CM

## 2020-08-27 MED ORDER — ACCU-CHEK GUIDE W/DEVICE KIT: PACK | 0 refills | Status: DC

## 2020-08-27 MED ORDER — GABAPENTIN 300 MG PO CAPS: 300.0000 mg | ORAL_CAPSULE | Freq: Three times a day (TID) | ORAL | 3 refills | Status: DC

## 2020-08-27 NOTE — Telephone Encounter (Signed)
Patient requests new prescription for glucometer, his is "worn out". He currently uses Accu Chek Guide and still has some test strips. Ordered Accu Chek Guide kit to [facility].  Patient also asking about Toujeo shipment from [facility]. He received a letter several weeks ago that they were shipping medication refill to the office. Per office notes it has not been received yet, patient is down to his last pen. Will contact [employer] to check on status of shipment.  Pt also requests a lab appt for A1c. Last A1c was 03/14/20 (7.5%). Offered PCP appt, first available is 09/24/20. Pt insists he wants A1c done sooner. Ordered A1c and lab appt for tomorrow at patient's request. Scheduled PCP follow up for 8/23 @ 1:40 pm.

## 2020-08-27 NOTE — Patient Instructions (Signed)
Increase gabapentin '300mg'$  to three times daily Follow up one year or as needed

## 2020-08-28 ENCOUNTER — Other Ambulatory Visit (INDEPENDENT_AMBULATORY_CARE_PROVIDER_SITE_OTHER): Payer: Medicare Other

## 2020-08-28 ENCOUNTER — Other Ambulatory Visit: Payer: Medicare Other

## 2020-08-28 DIAGNOSIS — Z794 Long term (current) use of insulin: Secondary | ICD-10-CM

## 2020-08-28 DIAGNOSIS — E118 Type 2 diabetes mellitus with unspecified complications: Secondary | ICD-10-CM

## 2020-08-28 LAB — HEMOGLOBIN A1C: Hgb A1c MFr Bld: 8.3 % — ABNORMAL HIGH (ref 4.6–6.5)

## 2020-09-04 ENCOUNTER — Telehealth: Payer: Self-pay | Admitting: Pharmacist

## 2020-09-04 NOTE — Progress Notes (Signed)
    Chronic Care Management Pharmacy Assistant   Name: Patrick Kidd  MRN: [id number] DOB: [birth year]  Pharmacist Review  Made a call to [employer] to check on the status of patients shipment for Toujeo. Spoke to representative Malachy Mood who states that the last shipment was sent out 07/31/20 so the next shipment date will be 09/30/20 to be delivered to Dr. Ronnald Ramp office. Ethelene Hal Clinical Pharmacist Assistant [phone]   Time spent:10

## 2020-09-05 ENCOUNTER — Telehealth: Payer: Self-pay | Admitting: Internal Medicine

## 2020-09-05 NOTE — Telephone Encounter (Signed)
LVM for pt to rtn my call to schedule AWV with [facility]. Please schedule this appt if pt calls the office.  °

## 2020-09-12 NOTE — Telephone Encounter (Signed)
° ° °  Please return call to patient °

## 2020-09-13 NOTE — Telephone Encounter (Signed)
Received Toujeo pt assistance - 5 pens. Left voicemail for pt to pick up from office. Stored in nurse room fridge.

## 2020-09-14 ENCOUNTER — Other Ambulatory Visit: Payer: Self-pay | Admitting: Internal Medicine

## 2020-09-23 ENCOUNTER — Other Ambulatory Visit: Payer: Self-pay | Admitting: Internal Medicine

## 2020-09-23 DIAGNOSIS — E118 Type 2 diabetes mellitus with unspecified complications: Secondary | ICD-10-CM

## 2020-09-25 ENCOUNTER — Ambulatory Visit (INDEPENDENT_AMBULATORY_CARE_PROVIDER_SITE_OTHER): Payer: Medicare Other | Admitting: Internal Medicine

## 2020-09-25 ENCOUNTER — Encounter: Payer: Self-pay | Admitting: Internal Medicine

## 2020-09-25 ENCOUNTER — Other Ambulatory Visit: Payer: Medicare Other

## 2020-09-25 ENCOUNTER — Other Ambulatory Visit: Payer: Self-pay

## 2020-09-25 VITALS — BP 118/68 | HR 81 | Temp 98.8°F | Resp 16 | Ht 74.0 in | Wt 251.2 lb

## 2020-09-25 DIAGNOSIS — E118 Type 2 diabetes mellitus with unspecified complications: Secondary | ICD-10-CM | POA: Diagnosis not present

## 2020-09-25 DIAGNOSIS — D7289 Other specified disorders of white blood cells: Secondary | ICD-10-CM | POA: Diagnosis not present

## 2020-09-25 DIAGNOSIS — N3281 Overactive bladder: Secondary | ICD-10-CM | POA: Diagnosis not present

## 2020-09-25 DIAGNOSIS — E519 Thiamine deficiency, unspecified: Secondary | ICD-10-CM

## 2020-09-25 DIAGNOSIS — Z794 Long term (current) use of insulin: Secondary | ICD-10-CM | POA: Diagnosis not present

## 2020-09-25 DIAGNOSIS — I10 Essential (primary) hypertension: Secondary | ICD-10-CM

## 2020-09-25 DIAGNOSIS — E119 Type 2 diabetes mellitus without complications: Secondary | ICD-10-CM

## 2020-09-25 DIAGNOSIS — R799 Abnormal finding of blood chemistry, unspecified: Secondary | ICD-10-CM

## 2020-09-25 LAB — BASIC METABOLIC PANEL
BUN: 17 mg/dL (ref 6–23)
CO2: 27 mEq/L (ref 19–32)
Calcium: 9.3 mg/dL (ref 8.4–10.5)
Chloride: 102 mEq/L (ref 96–112)
Creatinine, Ser: 1.27 mg/dL (ref 0.40–1.50)
GFR: 55.1 mL/min — ABNORMAL LOW (ref >60.00)
Glucose, Bld: 266 mg/dL — ABNORMAL HIGH (ref 70–99)
Potassium: 4.6 mEq/L (ref 3.5–5.1)
Sodium: 136 mEq/L (ref 135–145)

## 2020-09-25 LAB — CBC WITH DIFFERENTIAL/PLATELET
Basophils Absolute: 0 10*3/uL (ref 0.0–0.1)
Basophils Relative: 0.2 % (ref 0.0–3.0)
Eosinophils Absolute: 0.1 10*3/uL (ref 0.0–0.7)
Eosinophils Relative: 0.7 % (ref 0.0–5.0)
HCT: 46.3 % (ref 39.0–52.0)
Hemoglobin: 15.8 g/dL (ref 13.0–17.0)
Lymphocytes Relative: 55 % — ABNORMAL HIGH (ref 12.0–46.0)
Lymphs Abs: 4.5 10*3/uL — ABNORMAL HIGH (ref 0.7–4.0)
MCHC: 34 g/dL (ref 30.0–36.0)
MCV: 91.9 fl (ref 78.0–100.0)
Monocytes Absolute: 0.5 10*3/uL (ref 0.1–1.0)
Monocytes Relative: 6.4 % (ref 3.0–12.0)
Neutro Abs: 3.1 10*3/uL (ref 1.4–7.7)
Neutrophils Relative %: 37.7 % — ABNORMAL LOW (ref 43.0–77.0)
Platelets: 90 10*3/uL — ABNORMAL LOW (ref 150.0–400.0)
RBC: 5.04 Mil/uL (ref 4.22–5.81)
RDW: 14.7 % (ref 11.5–15.5)
WBC: 8.3 10*3/uL (ref 4.0–10.5)

## 2020-09-25 LAB — MICROALBUMIN / CREATININE URINE RATIO
Creatinine,U: 35.1 mg/dL
Microalb Creat Ratio: 2 mg/g (ref 0.0–30.0)
Microalb, Ur: <0.7 mg/dL (ref 0.0–1.9)

## 2020-09-25 MED ORDER — FREESTYLE LIBRE 2 SENSOR MISC: 1.0000 | Freq: Every day | 5 refills | Status: DC

## 2020-09-25 MED ORDER — MIRABEGRON ER 50 MG PO TB24: 50.0000 mg | ORAL_TABLET | Freq: Every day | ORAL | 1 refills | Status: DC

## 2020-09-25 MED ORDER — FREESTYLE LIBRE 2 READER DEVI: 1.0000 | Freq: Every day | 5 refills | Status: DC

## 2020-09-25 NOTE — Patient Instructions (Signed)

## 2020-09-25 NOTE — Progress Notes (Signed)
Typical lyu  Subjective:  Patient ID: Patrick Kidd, male    DOB: [birth year]  Age: 76 y.o. MRN: [id number]  CC: Diabetes  This visit occurred during the SARS-CoV-2 public health emergency.  Safety protocols were in place, including screening questions prior to the visit, additional usage of staff PPE, and extensive cleaning of exam room while observing appropriate contact time as indicated for disinfecting solutions.    HPI Patrick Kidd presents for f/up -   Based on prescription refills he is no longer using a basal insulin.  He denies polys.  He is active and denies CP, DOE, palpitations, or edema.  Outpatient Medications Prior to Visit  Medication Sig Dispense Refill   blood glucose meter kit and supplies KIT Use to test blood sugar once daily. DX E11.8 1 each 0   Blood Glucose Monitoring Suppl (ACCU-CHEK GUIDE) w/Device KIT Use to check blood sugar daily. Dx E11.8 1 kit 0   Colchicine (MITIGARE) 0.6 MG CAPS Take 1 tablet by mouth daily. 90 capsule 0   dapagliflozin propanediol (FARXIGA) 10 MG TABS tablet Take 1 tablet (10 mg total) by mouth daily before breakfast. 112 tablet 0   fluticasone (FLONASE) 50 MCG/ACT nasal spray SPRAY 2 SPRAYS INTO EACH NOSTRIL EVERY DAY 48 mL 1   folic acid (FOLVITE) 1 MG tablet TAKE 1 TABLET EVERY DAY 90 tablet 1   gabapentin (NEURONTIN) 300 MG capsule Take 1 capsule (300 mg total) by mouth 3 (three) times daily. 270 capsule 3   glucose blood (COOL BLOOD GLUCOSE TEST STRIPS) test strip Use to check blood sugar daily. DX: E11.8 300 each 3   KLOR-CON M20 20 MEQ tablet TAKE 1 TABLET BY MOUTH EVERY DAY 90 tablet 1   lactulose (CHRONULAC) 10 GM/15ML solution TAKE 15 MLS (10 G TOTAL) BY MOUTH 2 (TWO) TIMES DAILY. 946 mL 2   Lancets (ACCU-CHEK MULTICLIX) lancets Use to check sugar daily. DX: E11.8 100 each 12   metFORMIN (GLUCOPHAGE) 500 MG tablet TAKE 1 TABLET BY MOUTH 2 TIMES DAILY WITH A MEAL. 180 tablet 0   Multiple Vitamin (MULTIVITAMIN WITH  MINERALS) TABS tablet Take 1 tablet by mouth daily.     pantoprazole (PROTONIX) 40 MG tablet Take 1 tablet 30 minutes before meals (breakfast and supper) twice a day 180 tablet 0   tadalafil (CIALIS) 20 MG tablet Take 1 tablet (20 mg total) by mouth daily as needed for erectile dysfunction. 6 tablet 3   thiamine 100 MG tablet Take 1 tablet (100 mg total) by mouth every other day. 45 tablet 1   insulin glargine, 2 Unit Dial, (TOUJEO MAX SOLOSTAR) 300 UNIT/ML Solostar Pen Inject 50 Units into the skin daily. 3 mL 3   furosemide (LASIX) 20 MG tablet TAKE 1 TABLET(20 MG) BY MOUTH DAILY (Patient not taking: Reported on 09/25/2020) 90 tablet 0   No facility-administered medications prior to visit.    ROS Review of Systems  Constitutional:  Negative for chills, diaphoresis, fatigue and unexpected weight change.  HENT: Negative.    Eyes: Negative.   Respiratory:  Negative for cough, chest tightness, shortness of breath and wheezing.   Cardiovascular:  Negative for chest pain, palpitations and leg swelling.  Gastrointestinal:  Negative for abdominal pain, diarrhea, nausea and vomiting.  Endocrine: Positive for polyuria.  Genitourinary:  Positive for frequency. Negative for difficulty urinating and dysuria.  Musculoskeletal: Negative.  Negative for arthralgias and myalgias.  Skin: Negative.   Neurological: Negative.  Negative for dizziness.  Hematological:  Negative for adenopathy. Does not bruise/bleed easily.  Psychiatric/Behavioral: Negative.     Objective:  BP 118/68 (BP Location: Right Arm, Patient Position: Sitting, Cuff Size: Large)   Pulse 81   Temp 98.8 F (37.1 C) (Oral)   Resp 16   Ht $R'6\' 2"'Xw$  (1.88 m)   Wt 251 lb 3.2 oz (113.9 kg)   SpO2 94%   BMI 32.25 kg/m   BP Readings from Last 3 Encounters:  09/25/20 118/68  08/27/20 138/71  06/26/20 131/64    Wt Readings from Last 3 Encounters:  09/25/20 251 lb 3.2 oz (113.9 kg)  08/27/20 245 lb 6.4 oz (111.3 kg)  06/26/20 246 lb  (111.6 kg)    Physical Exam Vitals reviewed.  HENT:     Nose: Nose normal.     Mouth/Throat:     Mouth: Mucous membranes are moist.  Eyes:     Conjunctiva/sclera: Conjunctivae normal.  Cardiovascular:     Rate and Rhythm: Normal rate and regular rhythm.     Heart sounds: No murmur heard. Pulmonary:     Effort: Pulmonary effort is normal.     Breath sounds: No stridor. No wheezing, rhonchi or rales.  Abdominal:     General: Abdomen is protuberant. Bowel sounds are normal. There is no distension.     Palpations: Abdomen is soft. There is no fluid wave, hepatomegaly, splenomegaly or mass.  Musculoskeletal:        General: Normal range of motion.     Cervical back: Neck supple.     Right lower leg: No edema.     Left lower leg: No edema.  Lymphadenopathy:     Cervical: No cervical adenopathy.  Skin:    General: Skin is warm and dry.  Neurological:     General: No focal deficit present.     Mental Status: He is alert.    Lab Results  Component Value Date   WBC 8.3 09/25/2020   HGB 15.8 09/25/2020   HCT 46.3 09/25/2020   PLT 90.0 Repeated and verified X2. (L) 09/25/2020   GLUCOSE 266 (H) 09/25/2020   CHOL 186 03/14/2020   TRIG 93.0 03/14/2020   HDL 54.20 03/14/2020   LDLCALC 114 (H) 03/14/2020   ALT 11 03/14/2020   AST 16 03/14/2020   NA 136 09/25/2020   K 4.6 09/25/2020   CL 102 09/25/2020   CREATININE 1.27 09/25/2020   BUN 17 09/25/2020   CO2 27 09/25/2020   TSH 1.92 03/14/2020   PSA 0.26 11/23/2019   INR 1.1 (H) 03/14/2020   HGBA1C 8.3 (H) 08/28/2020   MICROALBUR <0.7 09/25/2020    MR Abdomen W Wo Contrast  Result Date: 08/15/2020 CLINICAL DATA:  Renal mass, history of cirrhosis and known hepatic cyst. EXAM: MRI ABDOMEN WITHOUT AND WITH CONTRAST TECHNIQUE: Multiplanar multisequence MR imaging of the abdomen was performed both before and after the administration of intravenous contrast. CONTRAST:  61mL GADAVIST GADOBUTROL 1 MMOL/ML IV SOLN COMPARISON:   Ultrasound of the abdomen of July 24, 2020. Prior CTs of the abdomen and pelvis. FINDINGS: Lower chest: No effusion. No consolidation. Limited assessment of the lung bases on MRI. Hepatobiliary: Large RIGHT hepatic cyst. This measures 6.3 x 6.0 cm greatest axial dimension and is unchanged compared to images from 2020. Nodular hepatic contours as before. No focal, suspicious hepatic abnormality. Cholelithiasis with numerous gallstones layering dependently in the gallbladder. No sign of wall thickening. No sign of biliary duct distension. Pancreas: Mild atrophy of the pancreas. Preservation of normal  intrinsic T1 signal without signs of inflammation or visible lesion. Spleen:  Splenomegaly as before. Adrenals/Urinary Tract:  Adrenal glands are normal. Kidneys enhance symmetrically without focal lesion. No retro renal collection on MRI. Stomach/Bowel: Colonic diverticulosis, otherwise unremarkable to the extent evaluated on abdominal MRI. Vascular/Lymphatic: No pathologically enlarged lymph nodes identified. No abdominal aortic aneurysm demonstrated. Other: Trace ascites along the liver margin diminished since previous imaging Musculoskeletal: No suspicious bone lesions identified. IMPRESSION: 1. No abnormality to correspond to the ultrasound finding which likely was related artifact on the previous study. 2. Large RIGHT hepatic cyst measuring 6.3 x 6.0 cm greatest axial dimension is unchanged compared to images from 2020. 3. Signs of cirrhosis and portal hypertension. 4. Cholelithiasis. 5. Trace ascites diminished from previous imaging. Electronically Signed   By: Zetta Bills M.D.   On: 08/15/2020 15:31    Assessment & Plan:   Louay was seen today for diabetes.  Diagnoses and all orders for this visit:  Essential hypertension- His blood pressure is adequately well controlled. -     CBC with Differential/Platelet; Future -     Basic metabolic panel; Future -     Urinalysis, Routine w reflex  microscopic; Future -     Urinalysis, Routine w reflex microscopic -     Basic metabolic panel -     CBC with Differential/Platelet  Type 2 diabetes mellitus with complication, with long-term current use of insulin (HCC) -     HM Diabetes Foot Exam -     Basic metabolic panel; Future -     Microalbumin / creatinine urine ratio; Future -     Urinalysis, Routine w reflex microscopic; Future -     Urinalysis, Routine w reflex microscopic -     Microalbumin / creatinine urine ratio -     Basic metabolic panel  Thiamine deficiency- I will monitor his thiamine level. -     CBC with Differential/Platelet; Future -     Vitamin B1; Future -     Vitamin B1 -     CBC with Differential/Platelet  Insulin-requiring or dependent type II diabetes mellitus ([place])- His A1c is up to 8.3%.  Will restart basal insulin. -     Continuous Blood Gluc Sensor (FREESTYLE LIBRE 2 SENSOR) MISC; 1 Act by Does not apply route daily. -     Continuous Blood Gluc Receiver (FREESTYLE LIBRE 2 READER) DEVI; 1 Act by Does not apply route daily. -     insulin degludec (TRESIBA FLEXTOUCH) 200 UNIT/ML FlexTouch Pen; Inject 30 Units into the skin daily.  OAB (overactive bladder) -     mirabegron ER (MYRBETRIQ) 50 MG TB24 tablet; Take 1 tablet (50 mg total) by mouth daily.  Atypical lymphocytes present on peripheral blood smear -     Ambulatory referral to Hematology / Oncology  I have discontinued Hollice Espy B. Doswell "Bryan"'s Toujeo Max SoloStar. I am also having him start on FreeStyle Libre 2 Sensor, [employer] 2 Reader, mirabegron ER, and [employer]. Additionally, I am having him maintain his blood glucose meter kit and supplies, accu-chek multiclix, multivitamin with minerals, Cool Blood Glucose Test Strips, Colchicine, thiamine, lactulose, dapagliflozin propanediol, Klor-Con M20, tadalafil, fluticasone, folic acid, pantoprazole, gabapentin, Accu-Chek Guide, furosemide, and metFORMIN.  Meds ordered this  encounter  Medications   Continuous Blood Gluc Sensor (FREESTYLE LIBRE 2 SENSOR) MISC    Sig: 1 Act by Does not apply route daily.    Dispense:  2 each    Refill:  5  Continuous Blood Gluc Receiver (FREESTYLE LIBRE 2 READER) DEVI    Sig: 1 Act by Does not apply route daily.    Dispense:  2 each    Refill:  5   mirabegron ER (MYRBETRIQ) 50 MG TB24 tablet    Sig: Take 1 tablet (50 mg total) by mouth daily.    Dispense:  90 tablet    Refill:  1   insulin degludec (TRESIBA FLEXTOUCH) 200 UNIT/ML FlexTouch Pen    Sig: Inject 30 Units into the skin daily.    Dispense:  15 mL    Refill:  1     Follow-up: Return in about 4 months (around 01/25/2021).  Scarlette Calico, MD

## 2020-09-26 DIAGNOSIS — D7289 Other specified disorders of white blood cells: Secondary | ICD-10-CM | POA: Insufficient documentation

## 2020-09-26 LAB — URINALYSIS, ROUTINE W REFLEX MICROSCOPIC
Bilirubin Urine: NEGATIVE
Hgb urine dipstick: NEGATIVE
Ketones, ur: NEGATIVE
Leukocytes,Ua: NEGATIVE
Nitrite: NEGATIVE
RBC / HPF: NONE SEEN (ref 0–?)
Specific Gravity, Urine: 1.01 (ref 1.000–1.030)
Total Protein, Urine: NEGATIVE
Urine Glucose: >=1000 — AB
Urobilinogen, UA: 0.2 (ref 0.0–1.0)
WBC, UA: NONE SEEN (ref 0–?)
pH: 6 (ref 5.0–8.0)

## 2020-09-26 LAB — PATHOLOGIST SMEAR REVIEW

## 2020-09-27 ENCOUNTER — Telehealth: Payer: Self-pay | Admitting: Hematology and Oncology

## 2020-09-27 MED ORDER — TRESIBA FLEXTOUCH 200 UNIT/ML ~~LOC~~ SOPN: 30.0000 [IU] | PEN_INJECTOR | Freq: Every day | SUBCUTANEOUS | 1 refills | Status: DC

## 2020-09-27 NOTE — Telephone Encounter (Signed)
Received a new hem referral from Dr. Ronnald Ramp. [name] has been scheduled to see Dr. Lorenso Courier on 9/1 at 140pm. Pt aware to arrive 20 minutes early.

## 2020-09-30 LAB — VITAMIN B1: Vitamin B1 (Thiamine): 12 nmol/L (ref 8–30)

## 2020-10-04 ENCOUNTER — Inpatient Hospital Stay: Payer: Medicare Other

## 2020-10-04 ENCOUNTER — Other Ambulatory Visit: Payer: Self-pay

## 2020-10-04 ENCOUNTER — Inpatient Hospital Stay: Payer: Medicare Other | Attending: Hematology and Oncology | Admitting: Hematology and Oncology

## 2020-10-04 VITALS — BP 141/68 | HR 76 | Temp 98.3°F | Resp 18 | Ht 74.0 in | Wt 248.2 lb

## 2020-10-04 DIAGNOSIS — K219 Gastro-esophageal reflux disease without esophagitis: Secondary | ICD-10-CM | POA: Insufficient documentation

## 2020-10-04 DIAGNOSIS — Z87891 Personal history of nicotine dependence: Secondary | ICD-10-CM | POA: Insufficient documentation

## 2020-10-04 DIAGNOSIS — Z8546 Personal history of malignant neoplasm of prostate: Secondary | ICD-10-CM | POA: Diagnosis not present

## 2020-10-04 DIAGNOSIS — E119 Type 2 diabetes mellitus without complications: Secondary | ICD-10-CM | POA: Diagnosis not present

## 2020-10-04 DIAGNOSIS — K921 Melena: Secondary | ICD-10-CM | POA: Insufficient documentation

## 2020-10-04 DIAGNOSIS — Z86010 Personal history of colonic polyps: Secondary | ICD-10-CM | POA: Insufficient documentation

## 2020-10-04 DIAGNOSIS — D72820 Lymphocytosis (symptomatic): Secondary | ICD-10-CM | POA: Diagnosis not present

## 2020-10-04 DIAGNOSIS — Z801 Family history of malignant neoplasm of trachea, bronchus and lung: Secondary | ICD-10-CM | POA: Insufficient documentation

## 2020-10-04 DIAGNOSIS — D7289 Other specified disorders of white blood cells: Secondary | ICD-10-CM

## 2020-10-04 DIAGNOSIS — Z806 Family history of leukemia: Secondary | ICD-10-CM | POA: Diagnosis not present

## 2020-10-04 DIAGNOSIS — Z79899 Other long term (current) drug therapy: Secondary | ICD-10-CM | POA: Insufficient documentation

## 2020-10-04 DIAGNOSIS — Z8616 Personal history of COVID-19: Secondary | ICD-10-CM | POA: Diagnosis not present

## 2020-10-04 DIAGNOSIS — I10 Essential (primary) hypertension: Secondary | ICD-10-CM | POA: Insufficient documentation

## 2020-10-04 LAB — CMP (CANCER CENTER ONLY)
ALT: 11 U/L (ref 0–44)
AST: 17 U/L (ref 15–41)
Albumin: 4.1 g/dL (ref 3.5–5.0)
Alkaline Phosphatase: 78 U/L (ref 38–126)
Anion gap: 9 (ref 5–15)
BUN: 14 mg/dL (ref 8–23)
CO2: 26 mmol/L (ref 22–32)
Calcium: 9.6 mg/dL (ref 8.9–10.3)
Chloride: 106 mmol/L (ref 98–111)
Creatinine: 1.39 mg/dL — ABNORMAL HIGH (ref 0.61–1.24)
GFR, Estimated: 53 mL/min — ABNORMAL LOW (ref >60)
Glucose, Bld: 143 mg/dL — ABNORMAL HIGH (ref 70–99)
Potassium: 4.8 mmol/L (ref 3.5–5.1)
Sodium: 141 mmol/L (ref 135–145)
Total Bilirubin: 1.1 mg/dL (ref 0.3–1.2)
Total Protein: 7.2 g/dL (ref 6.5–8.1)

## 2020-10-04 LAB — LACTATE DEHYDROGENASE: LDH: 183 U/L (ref 98–192)

## 2020-10-04 LAB — CBC WITH DIFFERENTIAL (CANCER CENTER ONLY)
Abs Immature Granulocytes: 0.02 10*3/uL (ref 0.00–0.07)
Basophils Absolute: 0 10*3/uL (ref 0.0–0.1)
Basophils Relative: 0 %
Eosinophils Absolute: 0.1 10*3/uL (ref 0.0–0.5)
Eosinophils Relative: 1 %
HCT: 47.4 % (ref 39.0–52.0)
Hemoglobin: 16.9 g/dL (ref 13.0–17.0)
Immature Granulocytes: 0 %
Lymphocytes Relative: 52 %
Lymphs Abs: 5.1 10*3/uL — ABNORMAL HIGH (ref 0.7–4.0)
MCH: 31.6 pg (ref 26.0–34.0)
MCHC: 35.7 g/dL (ref 30.0–36.0)
MCV: 88.6 fL (ref 80.0–100.0)
Monocytes Absolute: 0.5 10*3/uL (ref 0.1–1.0)
Monocytes Relative: 5 %
Neutro Abs: 4 10*3/uL (ref 1.7–7.7)
Neutrophils Relative %: 42 %
Platelet Count: 109 10*3/uL — ABNORMAL LOW (ref 150–400)
RBC: 5.35 MIL/uL (ref 4.22–5.81)
RDW: 14.4 % (ref 11.5–15.5)
WBC Count: 9.7 10*3/uL (ref 4.0–10.5)
nRBC: 0 % (ref 0.0–0.2)

## 2020-10-04 LAB — C-REACTIVE PROTEIN: CRP: 0.8 mg/dL (ref <1.0)

## 2020-10-04 LAB — SAVE SMEAR(SSMR), FOR PROVIDER SLIDE REVIEW

## 2020-10-04 LAB — SEDIMENTATION RATE: Sed Rate: 5 mm/hr (ref 0–16)

## 2020-10-04 NOTE — Progress Notes (Signed)
[place] Telephone:(336) [phone]   Fax:([phone]) [place] NOTE  Patient Care Team: [name], MD as PCP - General (Internal Medicine) Lorretta Harp, MD as PCP - Cardiology (Cardiology) Pieter Partridge, DO as Consulting Physician (Neurology) Charlton Haws, [facility] as Pharmacist (Pharmacist)  Hematological/Oncological History # Atypical Lymphocytes  10/04/2020: establish care with Dr. Lorenso Courier   CHIEF COMPLAINTS/PURPOSE OF CONSULTATION:  "Atypical Lymphocytes  "  HISTORY OF PRESENTING ILLNESS:  Patrick Kidd 75 y.o. male with medical history significant for alcoholic cirrhosis, DM type II, diverticulosis, and BPH who presents for evaluation of an absolute lymphocytosis with atypical lymphocytes.   On review of the previous records Patrick Kidd has had a relative lymphocytosis since at least 11/16/2019.  At that time he did have white blood cell count 7.2, hemoglobin 14.4, platelets of 89, absolute lymphocyte count of 3700.  Percentage lymphocytes that time was 50.7%.  Most recently on 09/26/2018 the patient noted to have white blood cell count 8.3, hemoglobin of 15.8, platelets of 90 with a lymphocyte count of 55% and ALC of 4500.  Pathology review of blood film showed absolute lymphocytosis with increased atypical lymphocytes.  Due to concern for these findings the patient was referred to hematology for further evaluation management.  On exam today Patrick Kidd reports that he has "always had a little white blood cell count on file".  He notes that his father passed away of some form of leukemia and his mother passed away of lung cancer.  The patient was previously a smoker but "quit 50 years ago".  He notes that he did have COVID infection in 2019 and again in 2022.  He reports that he "never recovered from being in the hospital".  His weight has been increasing over the last several months.  He notes that his energy levels are okay and that his  "golf game is off".  He notes he does have some bleeding hemorrhoids with some occasional blood in the stool.  Otherwise he denies any fevers, chills, sweats, nausea, vomiting or diarrhea.  Full 10 point ROS was list below.  MEDICAL HISTORY:  Past Medical History:  Diagnosis Date   Acrophobia    Alcoholic cirrhosis ([place]) 2/35/5732   Anemia    Arthritis    foot by big toe   BPH associated with nocturia    Cataract    removed both eyes   Chronic cough    PMH of   COVID-19 12/2018   Diabetes mellitus without complication [facility])    Diverticulosis 07-03-2010   Colonoscopy.    Duodenal ulcer 2017   Fallen arches    Bilateral   GERD (gastroesophageal reflux disease)    Gout    Granuloma annulare    Hx of adenomatous colonic polyps multiple   Hydrocele 2011   Large septated right hydrocele   Liver cyst    Liver lesion    Nonspecific elevation of levels of transaminase or lactic acid dehydrogenase (LDH)    Obesity    Peripheral neuropathy    Plantar fasciitis    PMH of   Portal hypertension ([place]) 2017   Prostate cancer ([place])    Right shoulder pain 11/2017   Sleep apnea    no cpap, patient denies   Thiamine deficiency    Wears reading eyeglasses    WPW (Wolff-Parkinson-White syndrome) 02/05/2019    SURGICAL HISTORY: Past Surgical History:  Procedure Laterality Date   CATARACT EXTRACTION, BILATERAL  12/2011  Dr Gershon Crane   COLONOSCOPY  2017   COLONOSCOPY W/ POLYPECTOMY  07/03/2010   2 adenomas, diverticulosis on right. Dr [name]   CYSTOSCOPY N/A 03/15/2018   Procedure: [name];  Surgeon: [name], MD;  Location: [facility];  Service: Urology;  Laterality: N/A;  NO SEEDS FOUND IN BLADDER   ESOPHAGOGASTRODUODENOSCOPY (EGD) WITH PROPOFOL N/A 01/08/2016   Procedure: ESOPHAGOGASTRODUODENOSCOPY (EGD) WITH PROPOFOL;  Surgeon: [name], MD;  Location: [facility];  Service: Endoscopy;  Laterality: N/A;   [place]    RADIOACTIVE SEED IMPLANT N/A 03/15/2018   Procedure: RADIOACTIVE SEED IMPLANT/BRACHYTHERAPY IMPLANT;  Surgeon: [name], MD;  Location: [facility];  Service: Urology;  Laterality: N/A;   73 SEEDS IMPLANTED   SIGMOIDOSCOPY     SPACE OAR INSTILLATION N/A 03/15/2018   Procedure: SPACE OAR INSTILLATION;  Surgeon: [name], MD;  Location: [facility];  Service: Urology;  Laterality: N/A;   WISDOM TOOTH EXTRACTION      SOCIAL HISTORY: Social History   Socioeconomic History   Marital status: Married    Spouse name: [name]   Number of children: 2   Years of education: Not on file   Highest education level: Some college, no degree  Occupational History   Occupation: Adult nurse estate  Tobacco Use   Smoking status: Former    Packs/day: 2.00    Years: 6.00    Pack years: 12.00    Types: Cigarettes    Quit date: 02/04/1971    Years since quitting: 49.7   Smokeless tobacco: Never   Tobacco comments:    smoked age [age], up to 2 ppd  Vaping Use   Vaping Use: Never used  Substance and Sexual Activity   Alcohol use: Not Currently    Alcohol/week: 28.0 standard drinks    Types: 28 Shots of liquor per week    Comment: No alochol since Christmas Day 2020   Drug use: No   Sexual activity: Yes    Partners: Female  Other Topics Concern   Not on file  Social History Narrative   Worked in Adult nurse estate   2 children   Former smoker no drug use   Alcoholism, abstinent since 01/23/2019 most recently   Fun/Hobby: Play golf, grandchildren, [employer]      Patient is left-handed. He lives with his wife in a one level home. He swims most days.   Social Determinants of Health   Financial Resource Strain: Medium Risk   Difficulty of Paying Living Expenses: Somewhat hard  Food Insecurity: Not on file  Transportation Needs: Not on file  Physical Activity: Not on file  Stress: Not on file  Social Connections: Not on file  Intimate Partner  Violence: Not on file    FAMILY HISTORY: Family History  Problem Relation Age of Onset   Lung cancer Mother        smoker   Leukemia Father        Acute myelocytic   Diabetes Neg Hx    Stroke Neg Hx    Heart disease Neg Hx    Colon cancer Neg Hx    Colon polyps Neg Hx    Esophageal cancer Neg Hx    Rectal cancer Neg Hx    Stomach cancer Neg Hx     ALLERGIES:  is allergic to rifaximin, beta adrenergic blockers, and calcium channel blockers.  MEDICATIONS:  Current Outpatient Medications  Medication Sig  Dispense Refill   blood glucose meter kit and supplies KIT Use to test blood sugar once daily. DX E11.8 1 each 0   Colchicine (MITIGARE) 0.6 MG CAPS Take 1 tablet by mouth daily. 90 capsule 0   dapagliflozin propanediol (FARXIGA) 10 MG TABS tablet Take 1 tablet (10 mg total) by mouth daily before breakfast. 546 tablet 0   folic acid (FOLVITE) 1 MG tablet TAKE 1 TABLET EVERY DAY 90 tablet 1   gabapentin (NEURONTIN) 300 MG capsule Take 1 capsule (300 mg total) by mouth 3 (three) times daily. 270 capsule 3   glucose blood (COOL BLOOD GLUCOSE TEST STRIPS) test strip Use to check blood sugar daily. DX: E11.8 300 each 3   insulin degludec (TRESIBA FLEXTOUCH) 200 UNIT/ML FlexTouch Pen Inject 30 Units into the skin daily. 15 mL 1   Lancets (ACCU-CHEK MULTICLIX) lancets Use to check sugar daily. DX: E11.8 100 each 12   metFORMIN (GLUCOPHAGE) 500 MG tablet TAKE 1 TABLET BY MOUTH 2 TIMES DAILY WITH A MEAL. 180 tablet 0   Multiple Vitamin (MULTIVITAMIN WITH MINERALS) TABS tablet Take 1 tablet by mouth daily.     pantoprazole (PROTONIX) 40 MG tablet Take 1 tablet 30 minutes before meals (breakfast and supper) twice a day 180 tablet 0   tadalafil (CIALIS) 20 MG tablet Take 1 tablet (20 mg total) by mouth daily as needed for erectile dysfunction. 6 tablet 3   No current facility-administered medications for this visit.    REVIEW OF SYSTEMS:   Constitutional: ( - ) fevers, ( - )  chills , ( - )  night sweats Eyes: ( - ) blurriness of vision, ( - ) double vision, ( - ) watery eyes Ears, nose, mouth, throat, and face: ( - ) mucositis, ( - ) sore throat Respiratory: ( - ) cough, ( - ) dyspnea, ( - ) wheezes Cardiovascular: ( - ) palpitation, ( - ) chest discomfort, ( - ) lower extremity swelling Gastrointestinal:  ( - ) nausea, ( - ) heartburn, ( - ) change in bowel habits Skin: ( - ) abnormal skin rashes Lymphatics: ( - ) new lymphadenopathy, ( - ) easy bruising Neurological: ( - ) numbness, ( - ) tingling, ( - ) new weaknesses Behavioral/Psych: ( - ) mood change, ( - ) new changes  All other systems were reviewed with the patient and are negative.  PHYSICAL EXAMINATION: ECOG PERFORMANCE STATUS: 1 - Symptomatic but completely ambulatory  Vitals:   10/04/20 1354  BP: (!) 141/68  Pulse: 76  Resp: 18  Temp: 98.3 F (36.8 C)  SpO2: 99%   Filed Weights   10/04/20 1354  Weight: 248 lb 3.2 oz (112.6 kg)    GENERAL: well appearing elderly Caucasian male in NAD  SKIN: skin color, texture, turgor are normal, no rashes or significant lesions EYES: conjunctiva are pink and non-injected, sclera clear LUNGS: clear to auscultation and percussion with normal breathing effort HEART: regular rate & rhythm and no murmurs and no lower extremity edema Musculoskeletal: no cyanosis of digits and no clubbing  PSYCH: alert & oriented x 3, fluent speech NEURO: no focal motor/sensory deficits  LABORATORY DATA:  I have reviewed the data as listed CBC Latest Ref Rng & Units 10/04/2020 09/25/2020 03/14/2020  WBC 4.0 - 10.5 K/uL 9.7 8.3 7.8  Hemoglobin 13.0 - 17.0 g/dL 16.9 15.8 15.8  Hematocrit 39.0 - 52.0 % 47.4 46.3 45.9  Platelets 150 - 400 K/uL 109(L) 90.0 Repeated and verified X2.(L) 85.0(L)  CMP Latest Ref Rng & Units 10/04/2020 09/25/2020 03/14/2020  Glucose 70 - 99 mg/dL 143(H) 266(H) 120(H)  BUN 8 - 23 mg/dL 14 17 20   Creatinine 0.61 - 1.24 mg/dL 1.39(H) 1.27 1.41  Sodium 135 - 145  mmol/L 141 136 139  Potassium 3.5 - 5.1 mmol/L 4.8 4.6 4.6  Chloride 98 - 111 mmol/L 106 102 102  CO2 22 - 32 mmol/L 26 27 31   Calcium 8.9 - 10.3 mg/dL 9.6 9.3 9.5  Total Protein 6.5 - 8.1 g/dL 7.2 - 6.9  Total Bilirubin 0.3 - 1.2 mg/dL 1.1 - 1.0  Alkaline Phos 38 - 126 U/L 78 - 67  AST 15 - 41 U/L 17 - 16  ALT 0 - 44 U/L 11 - 11    RADIOGRAPHIC STUDIES: No results found.  ASSESSMENT & PLAN Patrick Kidd 75 y.o. male with medical history significant for alcoholic cirrhosis, DM type II, diverticulosis, and BPH who presents for evaluation of an absolute lymphocytosis with atypical lymphocytes.   After review of the labs, review of the records, and discussion with the patient the patients findings are most consistent with an absolute lymphocytosis with atypical lymphocytes.  At this time the etiology of his findings are not clear.  He is not having any clear signs of a viral infection.  I would recommend that we perform flow cytometry in order to determine if this is a monoclonal population of lymphocytes.  If so this may represent CLL versus another indolent lymphoma.  # Atypical Lymphocytes  --today will order LDH, CBC, and CMP --inflammatory markers with CRP and ESR --will order flow cytometry. Peripheral blood film reviewed by pathology on 09/25/2020. Will conduct our own review as well --no clear indication for imaging at this time. Will await the results of the above studies.  --RTC pending the above workup.   Orders Placed This Encounter  Procedures   CBC with Differential ([place] Only)    Standing Status:   Future    Number of Occurrences:   1    Standing Expiration Date:   10/04/2021   CMP ([place] only)    Standing Status:   Future    Number of Occurrences:   1    Standing Expiration Date:   10/04/2021   Lactate dehydrogenase (LDH)    Standing Status:   Future    Number of Occurrences:   1    Standing Expiration Date:   10/04/2021   Sedimentation rate     Standing Status:   Future    Number of Occurrences:   1    Standing Expiration Date:   10/04/2021   C-reactive protein    Standing Status:   Future    Number of Occurrences:   1    Standing Expiration Date:   10/04/2021   Flow Cytometry, Peripheral Blood (Oncology)    Standing Status:   Future    Number of Occurrences:   1    Standing Expiration Date:   10/04/2021   Save Smear (SSMR)    Standing Status:   Future    Number of Occurrences:   1    Standing Expiration Date:   10/04/2021    All questions were answered. The patient knows to call the clinic with any problems, questions or concerns.  A total of more than 60 minutes were spent on this encounter with face-to-face time and non-face-to-face time, including preparing to see the patient, ordering tests and/or medications, counseling the patient and coordination  of care as outlined above.   Ledell Peoples, MD Department of Hematology/Oncology [place] at [facility] Phone: [phone] Pager: [phone] Email: [email or web address]  10/10/2020 9:08 AM

## 2020-10-10 ENCOUNTER — Encounter: Payer: Self-pay | Admitting: Hematology and Oncology

## 2020-10-10 LAB — SURGICAL PATHOLOGY

## 2020-10-11 ENCOUNTER — Encounter: Payer: Self-pay | Admitting: Hematology and Oncology

## 2020-10-11 ENCOUNTER — Telehealth: Payer: Self-pay | Admitting: Pharmacist

## 2020-10-11 DIAGNOSIS — E118 Type 2 diabetes mellitus with unspecified complications: Secondary | ICD-10-CM

## 2020-10-11 DIAGNOSIS — Z794 Long term (current) use of insulin: Secondary | ICD-10-CM

## 2020-10-11 LAB — FLOW CYTOMETRY

## 2020-10-11 NOTE — Progress Notes (Signed)
  Chronic Care Management Pharmacy Assistant   Name: Patrick Kidd MRN: [id number] DOB: 06/13/1945  Reason for Encounter: Disease State   Conditions to be addressed/monitored: DMII  Recent office visits:  09/25/20 [facility] (PCP) - Diabetes, Hypertension.  Start Tresiba 30 units & Mirabegron 50 mg. D/c Toujeo Max SoloStar.  Recent consult visits:  10/04/20 [facility] (Oncology) - NP Lymphocytosis. D/c Flonase, Lasix, Lactulose, Myrbetriq, Potassium, & Thiamine.  08/27/20 [facility] (Neurology) - Diabetic polyneuropathy. Start Gabapentin 300 mg 3x day.   Hospital visits:  None in previous 6 months  Medications: Outpatient Encounter Medications as of 10/11/2020  Medication Sig Note   blood glucose meter kit and supplies KIT Use to test blood sugar once daily. DX E11.8    Colchicine (MITIGARE) 0.6 MG CAPS Take 1 tablet by mouth daily. 11/16/2019: PRN   dapagliflozin propanediol (FARXIGA) 10 MG TABS tablet Take 1 tablet (10 mg total) by mouth daily before breakfast.    folic acid (FOLVITE) 1 MG tablet TAKE 1 TABLET EVERY DAY    gabapentin (NEURONTIN) 300 MG capsule Take 1 capsule (300 mg total) by mouth 3 (three) times daily.    glucose blood (COOL BLOOD GLUCOSE TEST STRIPS) test strip Use to check blood sugar daily. DX: E11.8    insulin degludec (TRESIBA FLEXTOUCH) 200 UNIT/ML FlexTouch Pen Inject 30 Units into the skin daily.    Lancets (ACCU-CHEK MULTICLIX) lancets Use to check sugar daily. DX: E11.8    metFORMIN (GLUCOPHAGE) 500 MG tablet TAKE 1 TABLET BY MOUTH 2 TIMES DAILY WITH A MEAL. 10/04/2020: Takes 3 x a day    Multiple Vitamin (MULTIVITAMIN WITH MINERALS) TABS tablet Take 1 tablet by mouth daily.    pantoprazole (PROTONIX) 40 MG tablet Take 1 tablet 30 minutes before meals (breakfast and supper) twice a day    tadalafil (CIALIS) 20 MG tablet Take 1 tablet (20 mg total) by mouth daily as needed for erectile dysfunction.    No facility-administered encounter medications on file as of  10/11/2020.    Recent Relevant Labs: Lab Results  Component Value Date/Time   HGBA1C 8.3 (H) 08/28/2020 11:30 AM   HGBA1C 7.5 (H) 03/14/2020 01:48 PM   MICROALBUR <0.7 09/25/2020 02:36 PM   MICROALBUR 3.4 (H) 03/14/2019 10:36 AM    Kidney Function Lab Results  Component Value Date/Time   CREATININE 1.39 (H) 10/04/2020 03:18 PM   CREATININE 1.27 09/25/2020 02:36 PM   CREATININE 1.41 03/14/2020 01:48 PM   CREATININE 1.13 06/04/2010 01:54 PM   GFR 55.10 (L) 09/25/2020 02:36 PM   GFRNONAA 53 (L) 10/04/2020 03:18 PM   GFRAA >60 02/06/2019 05:46 AM    Current antihyperglycemic regimen:  Metformin 500 mg  Toujeo Max Solostar 300/unit ml             Farxiga 10 mg 1 tab daily  What recent interventions/DTPs have been made to improve glycemic control:   None noted  Have there been any recent hospitalizations or ED visits since last visit with CPP? No  Patient denies hypoglycemic symptoms, including None  Patient denies hyperglycemic symptoms, including none  How often are you checking your blood sugar?  Patient states he checks his glucose 1x week.  What are your blood sugars ranging?  Patient states his readings run between 130 - 140.  During the week, how often does your blood glucose drop below 70? Never  Are you checking your feet daily/regularly?  Patient states no problems with his feet.  Adherence Review: Is the patient   currently on a STATIN medication? No Is the patient currently on ACE/ARB medication? No Does the patient have >5 day gap between last estimated fill dates? No Metformin  Toujeo Max Solostar-patient assistance [employer] [facility]: Metformin 500 mg - last filled 09/23/20 90 days Farxiga 10 mg 1 tab daily last filled - receiving through PAP  Orinda Kenner, [place] Pharmacists Assistant [phone]  Time Spent: [phone]

## 2020-10-18 ENCOUNTER — Other Ambulatory Visit: Payer: Self-pay | Admitting: Internal Medicine

## 2020-10-26 NOTE — Progress Notes (Signed)
Called and spoke with patient, he stated he is using Toujeo Max Solostar 300 u/ml.  Orinda Kenner, [place] Clinical Pharmacists Assistant [phone]  Time Spent: 7

## 2020-10-29 ENCOUNTER — Other Ambulatory Visit: Payer: Self-pay | Admitting: Internal Medicine

## 2020-10-29 DIAGNOSIS — K219 Gastro-esophageal reflux disease without esophagitis: Secondary | ICD-10-CM

## 2020-10-29 DIAGNOSIS — R053 Chronic cough: Secondary | ICD-10-CM

## 2020-11-02 ENCOUNTER — Other Ambulatory Visit: Payer: Self-pay | Admitting: Internal Medicine

## 2020-11-02 MED ORDER — TOUJEO MAX SOLOSTAR 300 UNIT/ML ~~LOC~~ SOPN: 30.0000 [IU] | PEN_INJECTOR | Freq: Every day | SUBCUTANEOUS | 1 refills | Status: DC

## 2020-11-02 NOTE — Addendum Note (Signed)
Addended by: Charlton Haws on: 11/02/2020 10:45 AM   Modules accepted: Orders

## 2020-11-06 ENCOUNTER — Telehealth: Payer: Self-pay | Admitting: Hematology and Oncology

## 2020-11-06 ENCOUNTER — Telehealth: Payer: Self-pay

## 2020-11-06 NOTE — Telephone Encounter (Signed)
Scheduled appt per 10/3 sch msg, pt was called and confirmed appt.

## 2020-11-06 NOTE — Telephone Encounter (Signed)
Pt called to ask why he would need to be seen in clinic 10/20. This nurse attempted to return pts call and LVM informing pt her MD note he would have pt RTC pending lab results. Advised pt to call with any concerns.

## 2020-11-08 ENCOUNTER — Encounter: Payer: Self-pay | Admitting: Hematology and Oncology

## 2020-11-08 ENCOUNTER — Inpatient Hospital Stay: Payer: Medicare Other | Attending: Hematology and Oncology | Admitting: Hematology and Oncology

## 2020-11-08 ENCOUNTER — Inpatient Hospital Stay: Payer: Medicare Other

## 2020-11-08 ENCOUNTER — Other Ambulatory Visit: Payer: Self-pay

## 2020-11-08 VITALS — BP 148/76 | HR 83 | Temp 96.6°F | Resp 18 | Wt 251.0 lb

## 2020-11-08 DIAGNOSIS — C9110 Chronic lymphocytic leukemia of B-cell type not having achieved remission: Secondary | ICD-10-CM

## 2020-11-08 DIAGNOSIS — D72820 Lymphocytosis (symptomatic): Secondary | ICD-10-CM

## 2020-11-08 LAB — CMP (CANCER CENTER ONLY)
ALT: 11 U/L (ref 0–44)
AST: 18 U/L (ref 15–41)
Albumin: 3.9 g/dL (ref 3.5–5.0)
Alkaline Phosphatase: 75 U/L (ref 38–126)
Anion gap: 9 (ref 5–15)
BUN: 15 mg/dL (ref 8–23)
CO2: 27 mmol/L (ref 22–32)
Calcium: 9.5 mg/dL (ref 8.9–10.3)
Chloride: 106 mmol/L (ref 98–111)
Creatinine: 1.22 mg/dL (ref 0.61–1.24)
GFR, Estimated: >60 mL/min (ref >60)
Glucose, Bld: 148 mg/dL — ABNORMAL HIGH (ref 70–99)
Potassium: 4.7 mmol/L (ref 3.5–5.1)
Sodium: 142 mmol/L (ref 135–145)
Total Bilirubin: 0.9 mg/dL (ref 0.3–1.2)
Total Protein: 7 g/dL (ref 6.5–8.1)

## 2020-11-08 LAB — CBC WITH DIFFERENTIAL (CANCER CENTER ONLY)
Abs Immature Granulocytes: 0.02 10*3/uL (ref 0.00–0.07)
Basophils Absolute: 0 10*3/uL (ref 0.0–0.1)
Basophils Relative: 0 %
Eosinophils Absolute: 0.1 10*3/uL (ref 0.0–0.5)
Eosinophils Relative: 1 %
HCT: 48.8 % (ref 39.0–52.0)
Hemoglobin: 17.1 g/dL — ABNORMAL HIGH (ref 13.0–17.0)
Immature Granulocytes: 0 %
Lymphocytes Relative: 58 %
Lymphs Abs: 5.1 10*3/uL — ABNORMAL HIGH (ref 0.7–4.0)
MCH: 31.4 pg (ref 26.0–34.0)
MCHC: 35 g/dL (ref 30.0–36.0)
MCV: 89.5 fL (ref 80.0–100.0)
Monocytes Absolute: 0.5 10*3/uL (ref 0.1–1.0)
Monocytes Relative: 6 %
Neutro Abs: 3.1 10*3/uL (ref 1.7–7.7)
Neutrophils Relative %: 35 %
Platelet Count: 99 10*3/uL — ABNORMAL LOW (ref 150–400)
RBC: 5.45 MIL/uL (ref 4.22–5.81)
RDW: 13.6 % (ref 11.5–15.5)
WBC Count: 8.8 10*3/uL (ref 4.0–10.5)
nRBC: 0 % (ref 0.0–0.2)

## 2020-11-08 LAB — LACTATE DEHYDROGENASE: LDH: 195 U/L — ABNORMAL HIGH (ref 98–192)

## 2020-11-08 MED ORDER — CYCLOBENZAPRINE HCL 5 MG PO TABS: 5.0000 mg | ORAL_TABLET | Freq: Three times a day (TID) | ORAL | 0 refills | Status: DC | PRN

## 2020-11-10 IMAGING — DX CERVICAL SPINE - COMPLETE 4+ VIEW
5 series · 5 of 5 positions shown · non-contrast
Comparison: None.

CLINICAL DATA: Posterior neck pain. No known injury. Cervical
radiculopathy.

EXAM:
CERVICAL SPINE - COMPLETE 4+ VIEW

[c-spine lat]
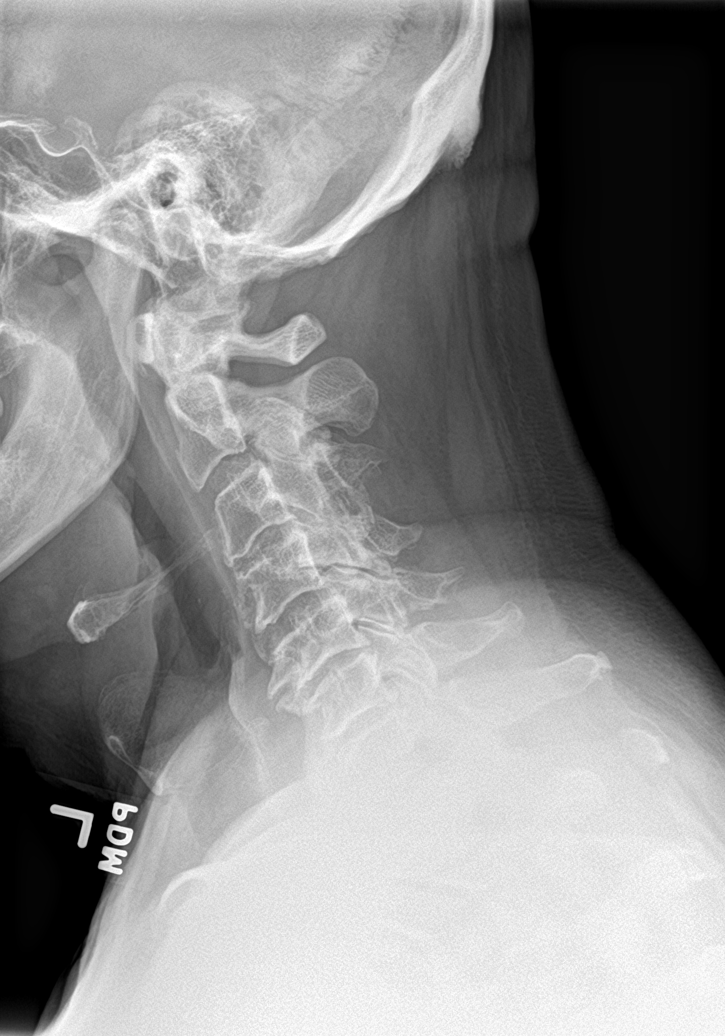

[c-spine obl (1 of 2)]
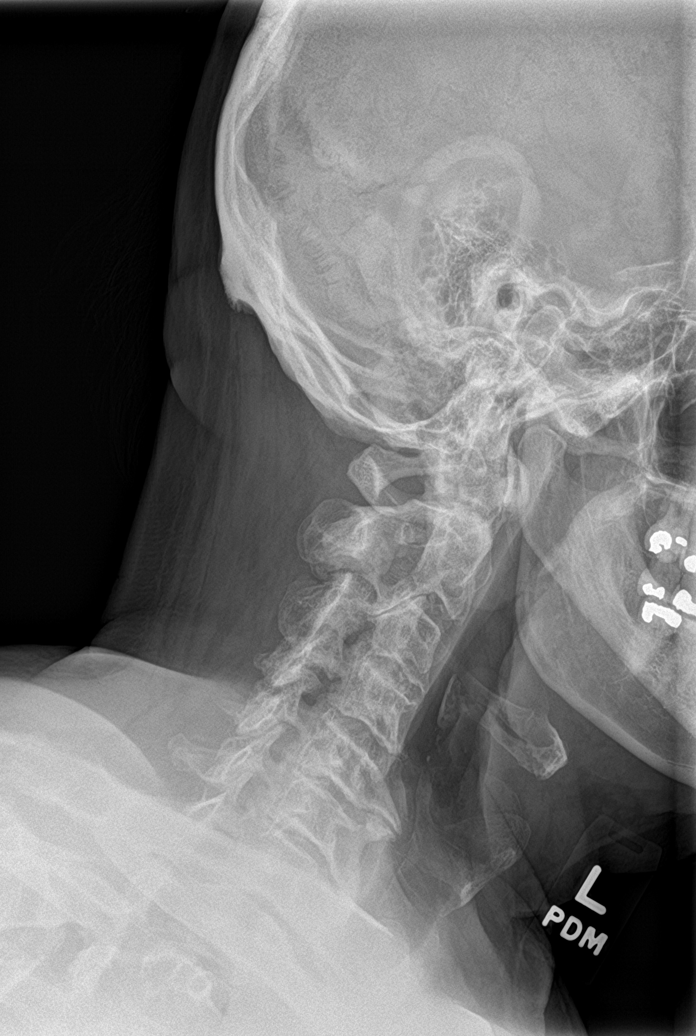

[c-spine obl (2 of 2)]
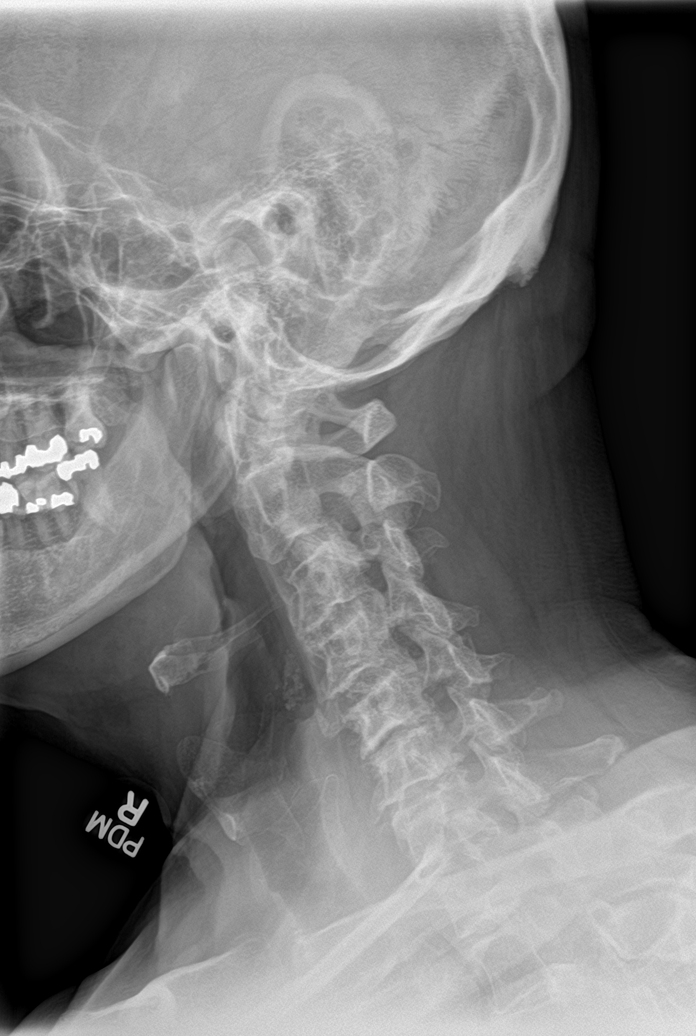

[c-spine ap]
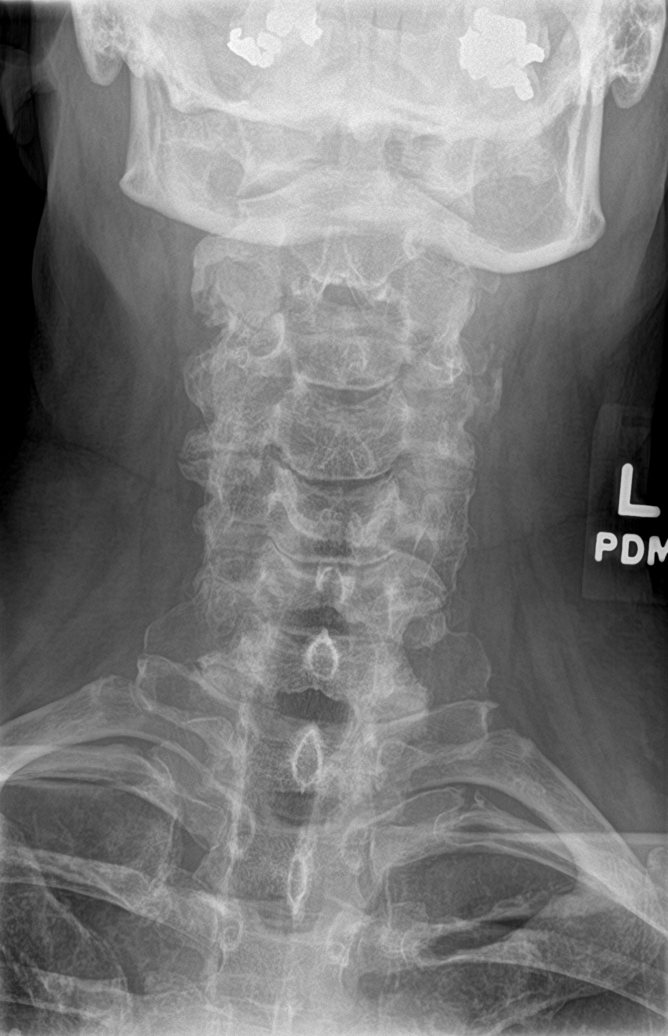

[c-spine open mouth]
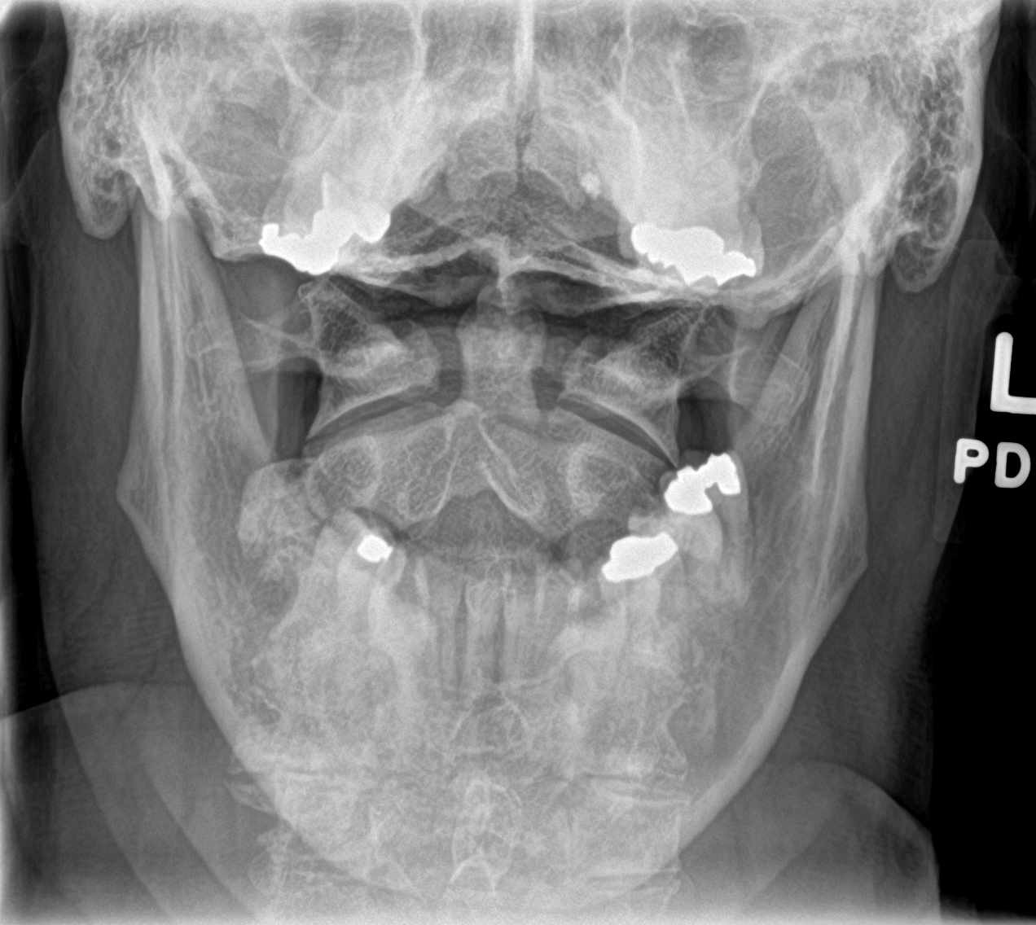

[5 of 5 positions shown; findings below may reference images not displayed]

FINDINGS: Degenerative disc disease with disc space narrowing and spurring
from C3-4 through C6-7, most pronounced at C5-6. Moderate to
advanced degenerative facet disease diffusely, right greater than
left. Severe right neural foraminal narrowing at C3-4 and C4-5. Mild
left neural foraminal narrowing at C4-5. No fracture or
malalignment. Prevertebral soft tissues are normal.
IMPRESSION: Cervical spondylosis. Bilateral neural foraminal narrowing, right
greater than left as described above. No acute bony abnormality.

## 2020-11-13 NOTE — Progress Notes (Signed)
[place] [phone]   [phone]  PROGRESS NOTE  Patient Care Team: [name], MD as PCP - General (Internal Medicine) Lorretta Harp, MD as PCP - Cardiology (Cardiology) Pieter Partridge, DO as Consulting Physician (Neurology) Charlton Haws, [facility] as Pharmacist (Pharmacist)  Hematological/Oncological History # Chronic Lymphocytic Leukemia (CLL) Rai Stage 0 10/04/2020: establish care with Dr. Lorenso Courier. WBC 9.7, ALC 5100. 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 male with medical history significant for CLL who presents for a follow up visit. The patient's last visit was on 10/04/2020 at which time he establish care. In the interim since the last visit his flow cytometry returned with results consistent with CLL.  On exam today Patrick Kidd reports that he pulled a muscle in his back and has been having difficulty sleeping because of the muscle pain.  He notes he would like a muscle relaxer to try to help with this.  He also notes that he has friends that started a diet called the [employer] and was interested in starting this.  He notes he is not having any issues with fevers, chills, sweats, nausea, vomiting or diarrhea.  He denies any lymphadenopathy.  Full 10 point ROS is listed below.  The bulk of our discussion focused on the diagnosis of CLL and the treatment options moving forward.  These conversations are detailed below.  MEDICAL HISTORY:  Past Medical History:  Diagnosis Date   Acrophobia    Alcoholic cirrhosis ([place]) 08/18/9676   Anemia    Arthritis    foot by big toe   BPH associated with nocturia    Cataract    removed both eyes   Chronic cough    PMH of   COVID-19 12/2018   Diabetes mellitus without complication [facility])    Diverticulosis 07-03-2010   Colonoscopy.    Duodenal ulcer 2017   Fallen arches     Bilateral   GERD (gastroesophageal reflux disease)    Gout    Granuloma annulare    Hx of adenomatous colonic polyps multiple   Hydrocele 2011   Large septated right hydrocele   Liver cyst    Liver lesion    Nonspecific elevation of levels of transaminase or lactic acid dehydrogenase (LDH)    Obesity    Peripheral neuropathy    Plantar fasciitis    PMH of   Portal hypertension ([place]) 2017   Prostate cancer ([place])    Right shoulder pain 11/2017   Sleep apnea    no cpap, patient denies   Thiamine deficiency    Wears reading eyeglasses    WPW (Wolff-Parkinson-White syndrome) 02/05/2019    SURGICAL HISTORY: Past Surgical History:  Procedure Laterality Date   CATARACT EXTRACTION, BILATERAL  12/2011    Dr Gershon Crane   COLONOSCOPY  2017   COLONOSCOPY W/ POLYPECTOMY  07/03/2010   2 adenomas, diverticulosis on right. Dr [name]   CYSTOSCOPY N/A 03/15/2018   Procedure: [name];  Surgeon: [name], MD;  Location: [facility];  Service: Urology;  Laterality: N/A;  NO SEEDS FOUND IN BLADDER   ESOPHAGOGASTRODUODENOSCOPY (EGD) WITH PROPOFOL N/A 01/08/2016   Procedure: ESOPHAGOGASTRODUODENOSCOPY (EGD) WITH PROPOFOL;  Surgeon: [name], MD;  Location: [facility];  Service: Endoscopy;  Laterality: N/A;   [place]   RADIOACTIVE SEED IMPLANT N/A 03/15/2018   Procedure: RADIOACTIVE SEED IMPLANT/BRACHYTHERAPY  IMPLANT;  Surgeon: [name], MD;  Location: [facility];  Service: Urology;  Laterality: N/A;   73 SEEDS IMPLANTED   SIGMOIDOSCOPY     SPACE OAR INSTILLATION N/A 03/15/2018   Procedure: SPACE OAR INSTILLATION;  Surgeon: [name], MD;  Location: [facility];  Service: Urology;  Laterality: N/A;   WISDOM TOOTH EXTRACTION      SOCIAL HISTORY: Social History   Socioeconomic History   Marital status: Married    Spouse name: [name]   Number of children: 2   Years of education: Not on  file   Highest education level: Some college, no degree  Occupational History   Occupation: Adult nurse estate  Tobacco Use   Smoking status: Former    Packs/day: 2.00    Years: 6.00    Pack years: 12.00    Types: Cigarettes    Quit date: 02/04/1971    Years since quitting: 49.8   Smokeless tobacco: Never   Tobacco comments:    smoked age [age], up to 2 ppd  Vaping Use   Vaping Use: Never used  Substance and Sexual Activity   Alcohol use: Not Currently    Alcohol/week: 28.0 standard drinks    Types: 28 Shots of liquor per week    Comment: No alochol since Christmas Day 2020   Drug use: No   Sexual activity: Yes    Partners: Female  Other Topics Concern   Not on file  Social History Narrative   Worked in Adult nurse estate   2 children   Former smoker no drug use   Alcoholism, abstinent since 01/23/2019 most recently   Fun/Hobby: Play golf, grandchildren, [employer]      Patient is left-handed. He lives with his wife in a one level home. He swims most days.   Social Determinants of Health   Financial Resource Strain: Medium Risk   Difficulty of Paying Living Expenses: Somewhat hard  Food Insecurity: Not on file  Transportation Needs: Not on file  Physical Activity: Not on file  Stress: Not on file  Social Connections: Not on file  Intimate Partner Violence: Not on file    FAMILY HISTORY: Family History  Problem Relation Age of Onset   Lung cancer Mother        smoker   Leukemia Father        Acute myelocytic   Diabetes Neg Hx    Stroke Neg Hx    Heart disease Neg Hx    Colon cancer Neg Hx    Colon polyps Neg Hx    Esophageal cancer Neg Hx    Rectal cancer Neg Hx    Stomach cancer Neg Hx     ALLERGIES:  is allergic to rifaximin, beta adrenergic blockers, and calcium channel blockers.  MEDICATIONS:  Current Outpatient Medications  Medication Sig Dispense Refill   cyclobenzaprine (FLEXERIL) 5 MG tablet Take 1 tablet (5 mg total) by mouth 3 (three)  times daily as needed for muscle spasms. 21 tablet 0   blood glucose meter kit and supplies KIT Use to test blood sugar once daily. DX E11.8 1 each 0   Colchicine (MITIGARE) 0.6 MG CAPS Take 1 tablet by mouth daily. 90 capsule 0   dapagliflozin propanediol (FARXIGA) 10 MG TABS tablet Take 1 tablet (10 mg total) by mouth daily before breakfast. 638 tablet 0   folic acid (FOLVITE) 1 MG tablet TAKE 1 TABLET BY MOUTH EVERY DAY 90 tablet 1  gabapentin (NEURONTIN) 300 MG capsule Take 1 capsule (300 mg total) by mouth 3 (three) times daily. 270 capsule 3   glucose blood (COOL BLOOD GLUCOSE TEST STRIPS) test strip Use to check blood sugar daily. DX: E11.8 300 each 3   insulin glargine, 2 Unit Dial, (TOUJEO MAX SOLOSTAR) 300 UNIT/ML Solostar Pen Inject 30 Units into the skin daily. Via SANOFI pt assistance 9 mL 1   Lancets (ACCU-CHEK MULTICLIX) lancets Use to check sugar daily. DX: E11.8 100 each 12   metFORMIN (GLUCOPHAGE) 500 MG tablet TAKE 1 TABLET BY MOUTH 2 TIMES DAILY WITH A MEAL. 180 tablet 0   Multiple Vitamin (MULTIVITAMIN WITH MINERALS) TABS tablet Take 1 tablet by mouth daily.     pantoprazole (PROTONIX) 40 MG tablet TAKE 1 TABLET 30 MINUTES BEFORE MEALS (BREAKFAST AND SUPPER) TWICE A DAY 180 tablet 0   tadalafil (CIALIS) 20 MG tablet Take 1 tablet (20 mg total) by mouth daily as needed for erectile dysfunction. 6 tablet 3   No current facility-administered medications for this visit.    REVIEW OF SYSTEMS:   Constitutional: ( - ) fevers, ( - )  chills , ( - ) night sweats Eyes: ( - ) blurriness of vision, ( - ) double vision, ( - ) watery eyes Ears, nose, mouth, throat, and face: ( - ) mucositis, ( - ) sore throat Respiratory: ( - ) cough, ( - ) dyspnea, ( - ) wheezes Cardiovascular: ( - ) palpitation, ( - ) chest discomfort, ( - ) lower extremity swelling Gastrointestinal:  ( - ) nausea, ( - ) heartburn, ( - ) change in bowel habits Skin: ( - ) abnormal skin rashes Lymphatics: ( - ) new  lymphadenopathy, ( - ) easy bruising Neurological: ( - ) numbness, ( - ) tingling, ( - ) new weaknesses Behavioral/Psych: ( - ) mood change, ( - ) new changes  All other systems were reviewed with the patient and are negative.  PHYSICAL EXAMINATION: ECOG PERFORMANCE STATUS: 1 - Symptomatic but completely ambulatory  Vitals:   11/08/20 0955  BP: (!) 148/76  Pulse: 83  Resp: 18  Temp: (!) 96.6 F (35.9 C)  SpO2: 98%   Filed Weights   11/08/20 0955  Weight: 251 lb (113.9 kg)    GENERAL: Well-appearing elderly Caucasian male, alert, no distress and comfortable SKIN: skin color, texture, turgor are normal, no rashes or significant lesions EYES: conjunctiva are pink and non-injected, sclera clear NECK: supple, non-tender LYMPH:  no palpable lymphadenopathy in the cervical, axillary or inguinal LUNGS: clear to auscultation and percussion with normal breathing effort HEART: regular rate & rhythm and no murmurs and no lower extremity edema Musculoskeletal: no cyanosis of digits and no clubbing  PSYCH: alert & oriented x 3, fluent speech NEURO: no focal motor/sensory deficits  LABORATORY DATA:  I have reviewed the data as listed CBC Latest Ref Rng & Units 11/08/2020 10/04/2020 09/25/2020  WBC 4.0 - 10.5 K/uL 8.8 9.7 8.3  Hemoglobin 13.0 - 17.0 g/dL 17.1(H) 16.9 15.8  Hematocrit 39.0 - 52.0 % 48.8 47.4 46.3  Platelets 150 - 400 K/uL 99(L) 109(L) 90.0 Repeated and verified X2.(L)    CMP Latest Ref Rng & Units 11/08/2020 10/04/2020 09/25/2020  Glucose 70 - 99 mg/dL 148(H) 143(H) 266(H)  BUN 8 - 23 mg/dL 15 14 17   Creatinine 0.61 - 1.24 mg/dL 1.22 1.39(H) 1.27  Sodium 135 - 145 mmol/L 142 141 136  Potassium 3.5 - 5.1 mmol/L 4.7 4.8 4.6  Chloride 98 - 111 mmol/L 106 106 102  CO2 22 - 32 mmol/L 27 26 27   Calcium 8.9 - 10.3 mg/dL 9.5 9.6 9.3  Total Protein 6.5 - 8.1 g/dL 7.0 7.2 -  Total Bilirubin 0.3 - 1.2 mg/dL 0.9 1.1 -  Alkaline Phos 38 - 126 U/L 75 78 -  AST 15 - 41 U/L 18 17 -   ALT 0 - 44 U/L 11 11 -   RADIOGRAPHIC STUDIES: I have personally reviewed the radiological images as listed and agreed with the findings in the report. No results found.  ASSESSMENT & PLAN Patrick Kidd 75 y.o. male with medical history significant for CLL who presents for a follow up visit.  At this time the findings are most consistent with CLL.  The patient has a monoclonal B-cell lymphocytosis, however the absolute lymphocyte count is greater than 5000 therefore would meet diagnostic criteria for CLL.  He does not have a leukocytosis and does not have any apparent lymphadenopathy or splenomegaly.  He does have thrombocytopenia which is likely secondary to his known liver disease.  Therefore this is a Rai stage 0 lymphocytosis.  Today I discussed with the patient the nature of CLL and treatment options moving forward.  Given that this is early stage and he does not have any concerning cytopenias or obstructive lymphadenopathy I recommend he continue with observation at this time.  We will order genetic testing in order to determine how aggressive this leukemia is.  In many cases CLL does not require treatment as it is very slow progressing malignancy.  At this time I recommend observation with return to clinic in approximately 6 months time to reevaluate.  # Chronic Lymphocytic Leukemia (CLL) Rai Stage 0 --Findings are consistent with chronic lymphocytic leukemia.  He is currently Rai stage 0. --No evidence of lymphadenopathy or splenomegaly at this time --No clear indication to start treatment --CLL prognostic panel, IGH V, and Zap 70 testing sent off --Plan for return to clinic in 6 months time to continue observation.  Orders Placed This Encounter  Procedures   Zap-70   IgVH Somatic Hypermutation    Standing Status:   Future    Number of Occurrences:   1    Standing Expiration Date:   11/08/2021   FISH, CLL Prognostic Panel    Standing Status:   Future    Number of  Occurrences:   1    Standing Expiration Date:   11/08/2021   CBC with Differential ([place] Only)    Standing Status:   Future    Number of Occurrences:   1    Standing Expiration Date:   11/08/2021   CMP ([place] only)    Standing Status:   Future    Number of Occurrences:   1    Standing Expiration Date:   11/08/2021   Lactate dehydrogenase (LDH)    Standing Status:   Future    Number of Occurrences:   1    Standing Expiration Date:   11/08/2021    All questions were answered. The patient knows to call the clinic with any problems, questions or concerns.  A total of more than 30 minutes were spent on this encounter with face-to-face time and non-face-to-face time, including preparing to see the patient, ordering tests and/or medications, counseling the patient and coordination of care as outlined above.   Ledell Peoples, MD Department of Hematology/Oncology [place] at [facility] Phone: [phone] Pager: [phone]  Email: [email or web address]  11/13/2020 3:43 PM

## 2020-11-14 ENCOUNTER — Telehealth: Payer: Self-pay | Admitting: Hematology and Oncology

## 2020-11-14 LAB — IGVH SOMATIC HYPERMUTATION

## 2020-11-14 NOTE — Telephone Encounter (Signed)
Scheduled per 10/11 in basket, pt has been called and confirmed appt

## 2020-11-19 ENCOUNTER — Other Ambulatory Visit: Payer: Self-pay | Admitting: Internal Medicine

## 2020-11-19 ENCOUNTER — Telehealth: Payer: Self-pay

## 2020-11-19 ENCOUNTER — Telehealth: Payer: Self-pay | Admitting: *Deleted

## 2020-11-19 DIAGNOSIS — Z794 Long term (current) use of insulin: Secondary | ICD-10-CM

## 2020-11-19 DIAGNOSIS — E118 Type 2 diabetes mellitus with unspecified complications: Secondary | ICD-10-CM

## 2020-11-19 LAB — FISH,CLL PROGNOSTIC PANEL

## 2020-11-19 NOTE — Telephone Encounter (Signed)
-----   Message from Orson Slick, MD sent at 11/19/2020  4:11 PM EDT ----- Please let Mr. Apostol know that his CLL prognostic panel has returned. He has a deletion 13 mutation. This is the mutation with the best prognosis. We will plan to see him back in 6 months time.  ----- Message ----- From: Buel Ream, [facility] [place] Sent: 11/08/2020  11:12 AM EDT To: Orson Slick, MD

## 2020-11-19 NOTE — Telephone Encounter (Signed)
TCT patient regarding recent lab results. No answer but was able to leave vm message for him. Advised that Dr. Lorenso Courier has received his CLL prognostic panel back. Pt has deletion 13 mutation.  This mutation has the best prognosis. Advised that we would see him back in 6 months.

## 2020-11-19 NOTE — Progress Notes (Signed)
Chronic Care Management Pharmacy Assistant   Name: Patrick Kidd MRN: [id number] DOB: 09/13/1945  Reason for Encounter: Disease State - Diabetes Appt. Telephone 12/06/20 @ 1 pm   Recent office visits:  None noted   Recent consult visits:  11/08/20 Lorenso Courier (Oncology) - Lymphocytosis. Start Cyclobenzaprine 5 mg.  Hospital visits:  None in previous 6 months  Medications: Outpatient Encounter Medications as of 11/19/2020  Medication Sig Note   blood glucose meter kit and supplies KIT Use to test blood sugar once daily. DX E11.8    Colchicine (MITIGARE) 0.6 MG CAPS Take 1 tablet by mouth daily. 11/16/2019: PRN   cyclobenzaprine (FLEXERIL) 5 MG tablet Take 1 tablet (5 mg total) by mouth 3 (three) times daily as needed for muscle spasms.    dapagliflozin propanediol (FARXIGA) 10 MG TABS tablet Take 1 tablet (10 mg total) by mouth daily before breakfast.    folic acid (FOLVITE) 1 MG tablet TAKE 1 TABLET BY MOUTH EVERY DAY    gabapentin (NEURONTIN) 300 MG capsule Take 1 capsule (300 mg total) by mouth 3 (three) times daily.    glucose blood (COOL BLOOD GLUCOSE TEST STRIPS) test strip Use to check blood sugar daily. DX: E11.8    insulin glargine, 2 Unit Dial, (TOUJEO MAX SOLOSTAR) 300 UNIT/ML Solostar Pen Inject 30 Units into the skin daily. Via SANOFI pt assistance    Lancets (ACCU-CHEK MULTICLIX) lancets Use to check sugar daily. DX: E11.8    metFORMIN (GLUCOPHAGE) 500 MG tablet TAKE 1 TABLET BY MOUTH 2 TIMES DAILY WITH A MEAL. 10/04/2020: Takes 3 x a day    Multiple Vitamin (MULTIVITAMIN WITH MINERALS) TABS tablet Take 1 tablet by mouth daily.    pantoprazole (PROTONIX) 40 MG tablet TAKE 1 TABLET 30 MINUTES BEFORE MEALS (BREAKFAST AND SUPPER) TWICE A DAY    tadalafil (CIALIS) 20 MG tablet Take 1 tablet (20 mg total) by mouth daily as needed for erectile dysfunction.    No facility-administered encounter medications on file as of 11/19/2020.    Recent Relevant Labs: Lab Results   Component Value Date/Time   HGBA1C 8.3 (H) 08/28/2020 11:30 AM   HGBA1C 7.5 (H) 03/14/2020 01:48 PM   MICROALBUR <0.7 09/25/2020 02:36 PM   MICROALBUR 3.4 (H) 03/14/2019 10:36 AM    Kidney Function Lab Results  Component Value Date/Time   CREATININE 1.22 11/08/2020 10:45 AM   CREATININE 1.39 (H) 10/04/2020 03:18 PM   CREATININE 1.13 06/04/2010 01:54 PM   GFR 55.10 (L) 09/25/2020 02:36 PM   GFRNONAA >60 11/08/2020 10:45 AM   GFRAA >60 02/06/2019 05:46 AM    Current antihyperglycemic regimen:  Metformin 500 mg  Toujeo Max Solostar 300/unit ml             Farxiga 10 mg 1 tab daily  What recent interventions/DTPs have been made to improve glycemic control:  None noted  Have there been any recent hospitalizations or ED visits since last visit with CPP? No  Patient denies hypoglycemic symptoms, including None  Patient denies hyperglycemic symptoms, including none  How often are you checking your blood sugar? twice daily  What are your blood sugars ranging?  Patient states his readings has been running between 120-160.  During the week, how often does your blood glucose drop below 70? Never  Are you checking your feet daily/regularly?  Patient states his feet has been sore due to being on a boat ride for four days.  Adherence Review: Is the patient currently on a STATIN  medication? No Is the patient currently on ACE/ARB medication? No Does the patient have >5 day gap between last estimated fill dates? No   Star Rating Drugs: Metformin 500 mg - last filled 09/23/2020 90D Farxiga 10 mg 1 tab daily last filled - receiving through PAP  Patient states he has no more refills on Metformin.  Care Gaps Colonoscopy - Appt. 03/30/22 Diabetic Foot Exam - Appt. 09/25/21 Mammogram - NA Ophthalmology - Appt. 04/26/21 Dexa Scan - NA Annual Well Visit - NA Micro albumin - Appt. 09/25/21 Hemoglobin A1c -  Appt. 02/28/21  Orinda Kenner, [place] Clinical Pharmacists  Assistant [phone]

## 2020-11-22 ENCOUNTER — Ambulatory Visit: Payer: Medicare Other | Admitting: Hematology and Oncology

## 2020-11-29 ENCOUNTER — Other Ambulatory Visit: Payer: Self-pay

## 2020-11-29 ENCOUNTER — Ambulatory Visit (INDEPENDENT_AMBULATORY_CARE_PROVIDER_SITE_OTHER): Payer: Medicare Other

## 2020-11-29 DIAGNOSIS — Z23 Encounter for immunization: Secondary | ICD-10-CM

## 2020-11-29 NOTE — Progress Notes (Signed)
Pt given HD flu vacc w/o any complications.

## 2020-12-06 ENCOUNTER — Telehealth: Payer: Self-pay

## 2020-12-06 ENCOUNTER — Ambulatory Visit (INDEPENDENT_AMBULATORY_CARE_PROVIDER_SITE_OTHER): Payer: Medicare Other

## 2020-12-06 ENCOUNTER — Other Ambulatory Visit: Payer: Self-pay

## 2020-12-06 DIAGNOSIS — Z794 Long term (current) use of insulin: Secondary | ICD-10-CM

## 2020-12-06 DIAGNOSIS — E785 Hyperlipidemia, unspecified: Secondary | ICD-10-CM

## 2020-12-06 DIAGNOSIS — E119 Type 2 diabetes mellitus without complications: Secondary | ICD-10-CM

## 2020-12-06 NOTE — Patient Instructions (Signed)
SARGENT MANKEY,  It was great to talk to you today!  Please call me with any questions or concerns.  Visit Information  Patient verbalizes understanding of instructions provided today and agrees to view in [place].   Telephone follow up appointment with care management team member scheduled for: 3 months The patient has been provided with contact information for the care management team and has been advised to call with any health related questions or concerns.   Tomasa Blase, PharmD Clinical Pharmacist, [place]

## 2020-12-06 NOTE — Progress Notes (Signed)
Chronic Care Management Pharmacy Note  12/06/2020 Name:  Patrick Kidd MRN:  [id number] DOB:  01-28-1946   Summary: - Patient reports that he has been doing well since last visit with CCM pharmacist, notes that blood sugars have been averaging 130-160, no issues with low blood sugars  -Patient does report to sciatic pain in his right leg that he has been dealing with for the last week - has been taking advil OTC as needed for pain -Needs refill of toujeo from [employer]   Recommendations/Changes made from today's visit: - Recommending no changes to medications at this time, will call [employer] for refill of touejo -Advised for patient to reach out to PCP for further evaluation of acute pain should it no improve within the next week  Subjective: Patrick Kidd is an 75 y.o. year old male who is a primary patient of [name], MD.  The CCM team was consulted for assistance with disease management and care coordination needs.    Engaged with patient by telephone for follow up visit in response to provider referral for pharmacy case management and/or care coordination services.   Consent to Services:  The patient was given information about Chronic Care Management services, agreed to services, and gave verbal consent prior to initiation of services.  Please see initial visit note for detailed documentation.   Patient Care Team: [name], MD as PCP - General (Internal Medicine) Lorretta Harp, MD as PCP - Cardiology (Cardiology) Pieter Partridge, DO as Consulting Physician (Neurology) Charlton Haws, [facility] as Pharmacist (Pharmacist)  Recent office visits: 09/25/20 - Dr. Ronnald Ramp - started on tresiba and mirabegron 57m daily   Recent consult visits: 11/08/20 - Dr. DLorenso Courier- Oncology - follow up for CLL - continued observation - follow up in 6 months  10/04/20 - Dr. DLorenso Courier- Oncology - ordered LDH, CBC, CMP, CRP, ESR, and flo cytometry - follow up after results are obtained    08/27/20 - Dr. JTomi Likens- Neurology - f/u for DM neuropathy - increase gabapentin to 3060mTID - f/u in 1 year    Hospital visits: None in previous 6 months  Objective:  Lab Results  Component Value Date   CREATININE 1.22 11/08/2020   BUN 15 11/08/2020   GFR 55.10 (L) 09/25/2020   GFRNONAA >60 11/08/2020   GFRAA >60 02/06/2019   NA 142 11/08/2020   K 4.7 11/08/2020   CALCIUM 9.5 11/08/2020   CO2 27 11/08/2020   GLUCOSE 148 (H) 11/08/2020    Lab Results  Component Value Date/Time   HGBA1C 8.3 (H) 08/28/2020 11:30 AM   HGBA1C 7.5 (H) 03/14/2020 01:48 PM   GFR 55.10 (L) 09/25/2020 02:36 PM   GFR 48.79 (L) 03/14/2020 01:48 PM   MICROALBUR <0.7 09/25/2020 02:36 PM   MICROALBUR 3.4 (H) 03/14/2019 10:36 AM    Last diabetic Eye exam:  Lab Results  Component Value Date/Time   HMDIABEYEEXA No Retinopathy 04/26/2020 02:11 PM    Last diabetic Foot exam: No results found for: HMDIABFOOTEX   Lab Results  Component Value Date   CHOL 186 03/14/2020   HDL 54.20 03/14/2020   LDLCALC 114 (H) 03/14/2020   TRIG 93.0 03/14/2020   CHOLHDL 3 03/14/2020    Hepatic Function Latest Ref Rng & Units 11/08/2020 10/04/2020 03/14/2020  Total Protein 6.5 - 8.1 g/dL 7.0 7.2 6.9  Albumin 3.5 - 5.0 g/dL 3.9 4.1 4.1  AST 15 - 41 U/L _0 ALT 0 -  44 U/L _0 Alk Phosphatase 38 - 126 U/L 75 78 67  Total Bilirubin 0.3 - 1.2 mg/dL 0.9 1.1 1.0  Bilirubin, Direct 0.0 - 0.3 mg/dL - - 0.2    Lab Results  Component Value Date/Time   TSH 1.92 03/14/2020 01:48 PM   TSH 1.830 01/29/2019 05:00 AM   TSH 1.63 11/11/2017 11:48 AM   FREET4 1.08 03/10/2014 02:07 PM    CBC Latest Ref Rng & Units 11/08/2020 10/04/2020 09/25/2020  WBC 4.0 - 10.5 K/uL 8.8 9.7 8.3  Hemoglobin 13.0 - 17.0 g/dL 17.1(H) 16.9 15.8  Hematocrit 39.0 - 52.0 % 48.8 47.4 46.3  Platelets 150 - 400 K/uL 99(L) 109(L) 90.0 Repeated and verified X2.(L)   No results found for: VD25OH  Clinical ASCVD: No  The 10-year ASCVD risk score  (Arnett DK, et al., 2019) is: 51.5%   Values used to calculate the score:     Age: 3 years     Sex: Male     Is Non-Hispanic African American: No     Diabetic: Yes     Tobacco smoker: No     Systolic Blood Pressure: 884 mmHg     Is BP treated: No     HDL Cholesterol: 54.2 mg/dL     Total Cholesterol: 186 mg/dL    Depression screen [facility] 2/9 09/25/2020 04/14/2019 03/14/2019  Decreased Interest 0 0 0  Down, Depressed, Hopeless 0 0 0  PHQ - 2 Score 0 0 0  Some recent data might be hidden     Social History   Tobacco Use  Smoking Status Former   Packs/day: 2.00   Years: 6.00   Pack years: 12.00   Types: Cigarettes   Quit date: 02/04/1971   Years since quitting: 49.8  Smokeless Tobacco Never  Tobacco Comments   smoked age [age], up to 2 ppd   BP Readings from Last 3 Encounters:  11/08/20 (!) 148/76  10/04/20 (!) 141/68  09/25/20 118/68   Pulse Readings from Last 3 Encounters:  11/08/20 83  10/04/20 76  09/25/20 81   Wt Readings from Last 3 Encounters:  11/08/20 251 lb (113.9 kg)  10/04/20 248 lb 3.2 oz (112.6 kg)  09/25/20 251 lb 3.2 oz (113.9 kg)   BMI Readings from Last 3 Encounters:  11/08/20 32.23 kg/m  10/04/20 31.87 kg/m  09/25/20 32.25 kg/m    Assessment/Interventions: Review of patient past medical history, allergies, medications, health status, including review of consultants reports, laboratory and other test data, was performed as part of comprehensive evaluation and provision of chronic care management services.   SDOH:  (Social Determinants of Health) assessments and interventions performed: Yes    CCM Care Plan  Allergies  Allergen Reactions   Rifaximin Nausea And Vomiting    Required EMS visit, although patient did not go to hospital   Beta Adrenergic Blockers     Likely WPW.  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  Use with caution.    Medications Reviewed Today     Reviewed by Otila Kluver, RN (Registered  Nurse) on 11/08/20 at [place] List Status: <None>   Medication Order Taking? Sig Documenting Provider Last Dose Status Informant  blood glucose meter kit and supplies KIT [id number] No Use to test blood sugar once daily. DX E11.8 [name], MD Taking Active Self  Colchicine (MITIGARE) 0.6 MG CAPS [id number] No Take 1 tablet by mouth daily. Scarlette Calico  L, MD Taking Active            Med Note Forde Dandy, MELISSA I   Wed Nov 16, 2019 10:47 AM) PRN  dapagliflozin propanediol (FARXIGA) 10 MG TABS tablet [id number] No Take 1 tablet (10 mg total) by mouth daily before breakfast. [name], MD Taking Active   folic acid (FOLVITE) 1 MG tablet [id number]  TAKE 1 TABLET BY MOUTH EVERY DAY [name], MD  Active   gabapentin (NEURONTIN) 300 MG capsule [id number] No Take 1 capsule (300 mg total) by mouth 3 (three) times daily. Pieter Partridge, DO Taking Active   glucose blood (COOL BLOOD GLUCOSE TEST STRIPS) test strip [id number] No Use to check blood sugar daily. DX: E11.8 [name], MD Taking Active   insulin glargine, 2 Unit Dial, (TOUJEO MAX SOLOSTAR) 300 UNIT/ML Solostar Pen [id number]  Inject 30 Units into the skin daily. Via [employer] pt assistance [name], MD  Active   Lancets (ACCU-CHEK MULTICLIX) lancets [id number] No Use to check sugar daily. DX: E11.8 [name], MD Taking Active Self  metFORMIN (GLUCOPHAGE) 500 MG tablet [id number] No TAKE 1 TABLET BY MOUTH 2 TIMES DAILY WITH A MEAL. [name], MD Taking Active            Med Note Linus Orn, ELIZABETH A   Thu Oct 04, 2020  2:15 PM) Takes 3 x a day   Multiple Vitamin (MULTIVITAMIN WITH MINERALS) TABS tablet [id number] No Take 1 tablet by mouth daily. Shelly Coss, MD Taking Active   pantoprazole (PROTONIX) 40 MG tablet [id number]  TAKE 1 TABLET 30 MINUTES BEFORE MEALS (BREAKFAST AND SUPPER) TWICE A DAY [name], MD  Active   tadalafil (CIALIS) 20 MG tablet [id number] No Take 1 tablet (20 mg total) by mouth daily as  needed for erectile dysfunction. [name], MD Taking Active             Patient Active Problem List   Diagnosis Date Noted   Chronic lymphocytic leukemia ([place]) 11/08/2020   Atypical lymphocytes present on peripheral blood smear 09/26/2020   Insulin-requiring or dependent type II diabetes mellitus ([place]) 09/25/2020   Primary stabbing headache 03/15/2020   Stage 3a chronic kidney disease ([place]) 03/15/2020   Nocturnal headaches 03/14/2020   Thiamine deficiency    Paroxysmal atrial fibrillation ([place]) 07/19/2019   Allergic rhinitis 06/09/2019   Erectile dysfunction due to diabetes mellitus ([place]) 06/09/2019   Overweight with body mass index (BMI) of 28 to 28.9 in adult 03/17/2019   WPW (Wolff-Parkinson-White syndrome) 02/05/2019   SBP (spontaneous bacterial peritonitis) (HCC)-December 2020,     Encephalopathy, hepatic    Degenerative disc disease, cervical 07/21/2018   Cervical radiculopathy 07/16/2018   OAB (overactive bladder) 07/06/2018   Malignant neoplasm of prostate ([place]) 01/13/2018   Hyperlipidemia LDL goal <100 11/11/2017   Essential hypertension 11/11/2017   BPH associated with nocturia 11/11/2017   Erectile dysfunction due to arterial insufficiency 04/07/2017   Peripheral vascular disease ([place]) 38/18/2993   Alcoholic cirrhosis ([place]) 71/69/6789   Type 2 diabetes mellitus with complication, with long-term current use of insulin ([place]) 08/22/2015   Hypersomnolence 03/19/2014   Peripheral neuropathy 01/11/2013   Polyclonal gammopathy 01/11/2013   GERD 10/04/2009    Immunization History  Administered Date(s) Administered   Fluad Quad(high Dose 65+) 01/30/2019, 11/23/2019, 11/29/2020   Hep A / Hep B 11/02/2015   Influenza, High Dose Seasonal PF 10/28/2016, 11/11/2017   Influenza,inj,Quad PF,6+ Mos 01/10/2016   Moderna  SARS-COV2 Booster Vaccination 12/13/2019   Moderna Sars-Covid-2 Vaccination 02/13/2019, 03/16/2019   Pneumococcal Conjugate-13 09/04/2015    Pneumococcal Polysaccharide-23 06/05/2011, 03/14/2019   Tdap 06/03/2009, 10/28/2016, 06/28/2019    Conditions to be addressed/monitored:  Hyperlipidemia, Diabetes and Cirrhosis  Care Plan : [place]  Updates made by Tomasa Blase, RPH since 12/06/2020 12:00 AM     Problem: Hyperlipidemia, Diabetes and Cirrhosis   Priority: High     Long-Range Goal: Disease management   Start Date: 04/19/2020  Expected End Date: 10/20/2020  This Visit's Progress: On track  Recent Progress: On track  Priority: High  Note:   Current Barriers:  Unable to independently afford treatment regimen Unable to independently monitor therapeutic efficacy Unable to maintain control of diabetes  Pharmacist Clinical Goal(s):  Patient will verbalize ability to afford treatment regimen achieve adherence to monitoring guidelines and medication adherence to achieve therapeutic efficacy maintain control of diabetes as evidenced by improved A1c  through collaboration with PharmD and provider.   Interventions: 1:1 collaboration with [name], MD regarding development and update of comprehensive plan of care as evidenced by provider attestation and co-signature Inter-disciplinary care team collaboration (see longitudinal plan of care) Comprehensive medication review performed; medication list updated in electronic medical record  Hyperlipidemia (LDL goal < 100) Not ideally controlled - pt was previously taking atorvastatin, but this was discontinued in summer 2021 by cardiologist due to cirrhosis diagnosis. LDL increased from 81 to 114 since stopping statin. LFTs, ammonia levels have been stable in normal range. Current regimen:  No medications Interventions: Recommend to discuss restarting statin with GI and/or cardiology  Diabetes (A1c goal < 7%) Not ideally controlled - patient reports fasting BG 130-160, checking once a week. He denies hypoglycemia  Lab Results  Component Value Date    HGBA1C 8.3 (H) 08/28/2020  Current regimen:  Toujeo Max 50 units daily (samples) Metformin 500 mg BID Farxiga 10 mg daily (via PAP) Interventions: Discussed A1c goal and benefits of medications Discussed progression of diabetes and low likelihood of control without insulin Pursue patient assistance for Toujeo [employer]) Recommend to continue current medication and check BG daily  Cirrhosis Controlled - per notes, currently well compensated. Patient is abstaining from alcohol.  Current regimen:  N/a Interventions: Recommended to follow up with gastroenterologist regarding necessity medication intervention  Patient Goals/Self-Care Activities Patient will:  - take medications as prescribed focus on medication adherence by routine check glucose daily, document, and provide at future appointments   Follow Up Plan: Telephone follow up appointment with care management team member scheduled for: 3 months       Medication Assistance:  Wilder [place] obtained through [place] medication assistance [place].  Enrollment ends 78/41/2820   -[employer] application for [employer] in process. Anticipated start date 05/20/20.  Patient's preferred pharmacy is:  [id number] [place] [place] [place]Phone: [phone]: [address] [place] [address]Phone: [phone]: [phone] [place] [address]Phone: [phone]: [phone] [facility] [address] [address]Phone: [phone]: [phone] [place] [place]: [phone]:  [phone]  Uses pill box? No - prefers bottles Pt endorses 100% compliance  We discussed: Current pharmacy is preferred with insurance plan and patient is satisfied with pharmacy services Patient decided to: Continue current medication management strategy  Care Plan and Follow Up Patient Decision:  Patient agrees to Care Plan and Follow-up.  Plan: Telephone follow up appointment with care management team member scheduled for:  3 months   Tomasa Blase, PharmD Clinical Pharmacist, [place]

## 2020-12-06 NOTE — Telephone Encounter (Signed)
Spoke with patient,   Made aware that toujeo prescription was received by [facility], available for patient to pick up at earliest convenience  Toujeo Max 300 units/mL - 2 different lots  3 packages of [id number] 3 packages of [place], PharmD Clinical Pharmacist, [place]

## 2020-12-20 NOTE — Telephone Encounter (Signed)
Patient picked up toujeo 12/20/2020  Tomasa Blase, PharmD Clinical Pharmacist, [place]  @TODAY @ 4:27 PM

## 2020-12-21 ENCOUNTER — Other Ambulatory Visit: Payer: Self-pay

## 2020-12-21 ENCOUNTER — Ambulatory Visit (INDEPENDENT_AMBULATORY_CARE_PROVIDER_SITE_OTHER): Payer: Medicare Other | Admitting: Family Medicine

## 2020-12-21 ENCOUNTER — Ambulatory Visit (INDEPENDENT_AMBULATORY_CARE_PROVIDER_SITE_OTHER): Payer: Medicare Other

## 2020-12-21 VITALS — BP 120/82 | HR 87 | Ht 74.0 in | Wt 251.0 lb

## 2020-12-21 DIAGNOSIS — M545 Low back pain, unspecified: Secondary | ICD-10-CM | POA: Diagnosis not present

## 2020-12-21 DIAGNOSIS — I739 Peripheral vascular disease, unspecified: Secondary | ICD-10-CM | POA: Diagnosis not present

## 2020-12-21 DIAGNOSIS — M25551 Pain in right hip: Secondary | ICD-10-CM | POA: Diagnosis not present

## 2020-12-21 DIAGNOSIS — M79604 Pain in right leg: Secondary | ICD-10-CM | POA: Diagnosis not present

## 2020-12-21 DIAGNOSIS — M47816 Spondylosis without myelopathy or radiculopathy, lumbar region: Secondary | ICD-10-CM | POA: Diagnosis not present

## 2020-12-21 MED ORDER — PREDNISONE 20 MG PO TABS: 20.0000 mg | ORAL_TABLET | Freq: Every day | ORAL | 0 refills | Status: DC

## 2020-12-21 NOTE — Assessment & Plan Note (Signed)
Patient is having right leg pain that the symptoms be secondary to likely deconditioning.  Differential does include lumbar radiculopathy and patient is clinically neuropathy.  Patient does have peripheral vascular disease as well as CLL significant for low back need to rule out any blood clot with a Doppler today.  With patient having a CLL do not think that the D-dimer would be accurate.  Patient will do thigh compression sleeve, heel lift, home exercises.  We will get x-rays of the lumbar spine as well.  Worsening pain seek medical attention immediately.  Follow-up with me again 6 weeks otherwise.

## 2020-12-21 NOTE — Progress Notes (Signed)
Patrick Patrick Kidd Phone: [phone] Subjective:   Patrick Patrick Kidd, am serving as a scribe for Dr. Hulan Patrick Kidd.  This visit occurred during the SARS-CoV-2 public health emergency.  Safety protocols were in place, including screening questions prior to the visit, additional usage of staff PPE, and extensive cleaning of exam room while observing appropriate contact time as indicated for disinfecting solutions.    I'm seeing this patient by the request  of:  Patrick Lima, MD  CC: Right leg pain  Patrick Patrick Kidd  Patrick Patrick Kidd is a 75 y.o. male coming in with complaint of proximal R hamstring pain. Feels like he stretched his leg too much with a band. Unable to sleep due to pain. Pain radiates down to knee. Last seen in 2020 for DDD cervical spine.  Patient states that he notices initially immediately after injury.  Patient states that once he gets moving seems to get better.  Sometimes it can be very severe when he goes from a sitting to standing position.  Does have discomfort at night.  Denies any swelling in the lower extremity.      Past Medical History:  Diagnosis Date   Acrophobia    Alcoholic cirrhosis (Patrick Patrick Kidd) 1/77/9390   Anemia    Arthritis    foot by big toe   BPH associated with nocturia    Cataract    removed both eyes   Chronic cough    PMH of   COVID-19 12/2018   Diabetes mellitus without complication [facility])    Diverticulosis 07-03-2010   Colonoscopy.    Duodenal ulcer 2017   Fallen arches    Bilateral   GERD (gastroesophageal reflux disease)    Gout    Granuloma annulare    Hx of adenomatous colonic polyps multiple   Hydrocele 2011   Large septated right hydrocele   Liver cyst    Liver lesion    Nonspecific elevation of levels of transaminase or lactic acid dehydrogenase (LDH)    Obesity    Peripheral neuropathy    Plantar fasciitis    PMH of   Portal hypertension (Patrick Patrick Kidd) 2017   Prostate  cancer (Patrick Patrick Kidd)    Right shoulder pain 11/2017   Sleep apnea    Patrick Kidd cpap, patient denies   Thiamine deficiency    Wears reading eyeglasses    WPW (Wolff-Parkinson-White syndrome) 02/05/2019   Past Surgical History:  Procedure Laterality Date   CATARACT EXTRACTION, BILATERAL  12/2011    Dr Gershon Crane   COLONOSCOPY  2017   COLONOSCOPY W/ POLYPECTOMY  07/03/2010   2 adenomas, diverticulosis on right. Dr [name]   CYSTOSCOPY N/A 03/15/2018   Procedure: [name];  Surgeon: [name], MD;  [facility];  Service: Urology;  Laterality: N/A;  Patrick Kidd SEEDS FOUND IN BLADDER   ESOPHAGOGASTRODUODENOSCOPY (EGD) WITH PROPOFOL N/A 01/08/2016   Procedure: ESOPHAGOGASTRODUODENOSCOPY (EGD) WITH PROPOFOL;  Surgeon: [name], MD;  Location: [facility];  Service: Endoscopy;  Laterality: N/A;   [place]   RADIOACTIVE SEED IMPLANT N/A 03/15/2018   Procedure: RADIOACTIVE SEED IMPLANT/BRACHYTHERAPY IMPLANT;  Surgeon: [name], MD;  Location: [place];  Service: Urology;  Laterality: N/A;   73 SEEDS IMPLANTED   SIGMOIDOSCOPY     SPACE OAR INSTILLATION N/A 03/15/2018   Procedure: SPACE OAR INSTILLATION;  Surgeon: [name], MD;  Location[facility];  Service: Urology;  Laterality: N/A;   WISDOM TOOTH EXTRACTION     Social History   Socioeconomic History   Marital status: Married    Spouse name: [name]   Number of children: 2   Years of education: Not on file   Highest education level: Some college, Patrick Kidd degree  Occupational History   Occupation: Adult nurse estate  Tobacco Use   Smoking status: Former    Packs/day: 2.00    Years: 6.00    Pack years: 12.00    Types: Cigarettes    Quit date: 02/04/1971    Years since quitting: 49.9   Smokeless tobacco: Never   Tobacco comments:    smoked age [age], up to 2 ppd  Vaping Use   Vaping Use: Never used  Substance and Sexual Activity   Alcohol  use: Not Currently    Alcohol/week: 28.0 standard drinks    Types: 28 Shots of liquor per week    Comment: Patrick Kidd alochol since Christmas Day 2020   Drug use: Patrick Kidd   Sexual activity: Yes    Partners: Female  Other Topics Concern   Not on file  Social History Narrative   Worked in Adult nurse estate   2 children   Former smoker Patrick Kidd drug use   Alcoholism, abstinent since 01/23/2019 most recently   Fun/Hobby: Play golf, grandchildren, [employer]      Patient is left-handed. He lives with his wife in a one level home. He swims most days.   Social Determinants of Health   Financial Resource Strain: Medium Risk   Difficulty of Paying Living Expenses: Somewhat hard  Food Insecurity: Not on file  Transportation Needs: Not on file  Physical Activity: Not on file  Stress: Not on file  Social Connections: Not on file   Allergies  Allergen Reactions   Rifaximin Nausea And Vomiting    Required EMS visit, although patient did not go to hospital   Beta Adrenergic Blockers     Likely WPW.  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  Use with caution.   Family History  Problem Relation Age of Onset   Lung cancer Mother        smoker   Leukemia Father        Acute myelocytic   Diabetes Neg Hx    Stroke Neg Hx    Heart disease Neg Hx    Colon cancer Neg Hx    Colon polyps Neg Hx    Esophageal cancer Neg Hx    Rectal cancer Neg Hx    Stomach cancer Neg Hx     Current Outpatient Medications (Endocrine & Metabolic):    dapagliflozin propanediol (FARXIGA) 10 MG TABS tablet, Take 1 tablet (10 mg total) by mouth daily before breakfast.   insulin glargine, 2 Unit Dial, (TOUJEO MAX SOLOSTAR) 300 UNIT/ML Solostar Pen, Inject 30 Units into the skin daily. Via [facility] pt assistance (Patient taking differently: Inject 50 Units into the skin daily. Via [employer] pt assistance)   metFORMIN (GLUCOPHAGE) 500 MG tablet, TAKE 1 TABLET BY MOUTH 2 TIMES DAILY WITH A MEAL.   predniSONE  (DELTASONE) 20 MG tablet, Take 1 tablet (20 mg total) by mouth daily with breakfast.  Current Outpatient Medications (Cardiovascular):    tadalafil (CIALIS) 20 MG tablet, Take 1 tablet (20 mg total) by mouth daily as needed for erectile dysfunction.   Current Outpatient Medications (Analgesics):    Colchicine (MITIGARE) 0.6 MG CAPS, Take 1 tablet by mouth  daily.  Current Outpatient Medications (Hematological):    folic acid (FOLVITE) 1 MG tablet, TAKE 1 TABLET BY MOUTH EVERY DAY  Current Outpatient Medications (Other):    blood glucose meter kit and supplies KIT, Use to test blood sugar once daily. DX E11.8   cyclobenzaprine (FLEXERIL) 5 MG tablet, Take 1 tablet (5 mg total) by mouth 3 (three) times daily as needed for muscle spasms.   gabapentin (NEURONTIN) 300 MG capsule, Take 1 capsule (300 mg total) by mouth 3 (three) times daily.   glucose blood (COOL BLOOD GLUCOSE TEST STRIPS) test strip, Use to check blood sugar daily. DX: E11.8   Lancets (ACCU-CHEK MULTICLIX) lancets, Use to check sugar daily. DX: E11.8   Multiple Vitamin (MULTIVITAMIN WITH MINERALS) TABS tablet, Take 1 tablet by mouth daily.   pantoprazole (PROTONIX) 40 MG tablet, TAKE 1 TABLET 30 MINUTES BEFORE MEALS (BREAKFAST AND SUPPER) TWICE A DAY   Reviewed prior external information including notes and imaging from  primary care provider As well as notes that were available from care everywhere and other healthcare systems.  Past medical history, social, surgical and family history all reviewed in electronic medical record.  Patrick Kidd pertanent information unless stated regarding to the chief complaint.   Review of Systems:  Patrick Kidd headache, visual changes, nausea, vomiting, diarrhea, constipation, dizziness, abdominal pain, skin rash, fevers, chills, night sweats, weight loss, swollen lymph nodes, joint swelling, chest pain, shortness of breath, mood changes. POSITIVE muscle aches, body aches  Objective  Blood pressure 120/82,  pulse 87, height 6' 2"  (1.88 m), weight 251 lb (113.9 kg), SpO2 96 %.   General: Patrick Kidd apparent distress alert and oriented x3 mood and affect normal, dressed appropriately.  Overweight HEENT: Pupils equal, extraocular movements intact  Respiratory: Patient's speak in full sentences and does not appear short of breath  Cardiovascular: 1+ dorsalis pedis pulse noted.  Patrick Kidd significant swelling of the lower extremity noted. Gait very mildly antalgic MSK: Patient is tender in the hamstring in the mid substance. Mild increase in discomfort near the posterior aspect of the knee with full extension.  Patient does have good strength of the hamstring noted.  Patrick Kidd pain with ischial area.  Patient does have peripheral neuropathy noted.   Limited muscular skeletal ultrasound was performed and interpreted by Patrick Patrick Kidd, M  Limited ultrasound of patient's hamstring does not show any true defect noted.  Mild area approximately 5 cm from the popliteal area there is an area of hypoechoic changes and seems the patient is minorly tender.  But once again Patrick Kidd true tear appreciated.   Impression and Recommendations:    The above documentation has been reviewed and is accurate and complete Lyndal Pulley, DO

## 2020-12-21 NOTE — Patient Instructions (Addendum)
Xray lumbar and R hip Heel lifts  [facility]  ([facility] [employer] in [facility]) [address] [place], [place] [address] [phone] [place] to schedule   Thigh compression  Ice Exercises Prednisone 20mg  7 days-Do not take other anti-inflammatories See me in 6 weeks

## 2020-12-24 ENCOUNTER — Ambulatory Visit: Payer: Medicare Other | Admitting: Family Medicine

## 2020-12-25 ENCOUNTER — Ambulatory Visit (HOSPITAL_COMMUNITY)
Admission: RE | Admit: 2020-12-25 | Discharge: 2020-12-25 | Disposition: A | Discharge status: Home / Self Care | Payer: Medicare Other | Source: Ambulatory Visit | Attending: Internal Medicine | Admitting: Internal Medicine

## 2020-12-25 ENCOUNTER — Telehealth: Payer: Self-pay

## 2020-12-25 ENCOUNTER — Other Ambulatory Visit: Payer: Self-pay

## 2020-12-25 DIAGNOSIS — M79604 Pain in right leg: Secondary | ICD-10-CM | POA: Diagnosis not present

## 2020-12-25 NOTE — Progress Notes (Signed)
    Chronic Care Management Pharmacy Assistant   Name: Patrick Kidd  MRN: [id number] DOB: [birth year]  Patient is currently enrolled in  [place]  patient assistance program for the medication Toujeo until date: 02/02/21 Patient would like to re-enroll in patient assistance for the next calendar year.  Renewal forms were completed on behalf of the patient. Patient has requested to sign forms in the office. Forms will be printed and placed at front desk.Left mesaage for patient to return call.  [place] Pharmacist Assistant [phone]

## 2020-12-31 LAB — ZAP-70

## 2021-01-02 DIAGNOSIS — E785 Hyperlipidemia, unspecified: Secondary | ICD-10-CM | POA: Diagnosis not present

## 2021-01-02 DIAGNOSIS — E119 Type 2 diabetes mellitus without complications: Secondary | ICD-10-CM | POA: Diagnosis not present

## 2021-01-02 DIAGNOSIS — Z794 Long term (current) use of insulin: Secondary | ICD-10-CM

## 2021-01-14 ENCOUNTER — Telehealth: Payer: Self-pay | Admitting: Internal Medicine

## 2021-01-14 NOTE — Telephone Encounter (Signed)
Pt wife tested positive for COVID this morning. Pt is negative. Wife is experiencing a bad cough and runny nose. Wanting advice as to what he should do, about booster and just in general.     Callback #- [phone]

## 2021-01-15 NOTE — Telephone Encounter (Signed)
Called pt again, LVM instructing him to test himself for COVID per PCP instruction and inform us of his results so we know how to proceed.

## 2021-01-15 NOTE — Telephone Encounter (Signed)
Pt has returned call to [employer].   Callback #- [phone]

## 2021-01-15 NOTE — Telephone Encounter (Signed)
Called pt, LVM.   

## 2021-01-29 ENCOUNTER — Other Ambulatory Visit: Payer: Self-pay | Admitting: Internal Medicine

## 2021-01-29 DIAGNOSIS — R053 Chronic cough: Secondary | ICD-10-CM

## 2021-01-29 DIAGNOSIS — K219 Gastro-esophageal reflux disease without esophagitis: Secondary | ICD-10-CM

## 2021-01-31 ENCOUNTER — Telehealth: Payer: Self-pay | Admitting: Internal Medicine

## 2021-01-31 NOTE — Telephone Encounter (Signed)
Patient needs another handicap placard for his car

## 2021-01-31 NOTE — Telephone Encounter (Signed)
Form has been completed and given to PCP for signature.  

## 2021-02-01 NOTE — Telephone Encounter (Signed)
Form signed  LVM for pt inquiring if he would like to pick it up or have it mailed to him.

## 2021-02-05 NOTE — Progress Notes (Signed)
[place] [facility] Phone: [phone] Subjective:   Fontaine No, am serving as a scribe for Dr. Hulan Saas. This visit occurred during the SARS-CoV-2 public health emergency.  Safety protocols were in place, including screening questions prior to the visit, additional usage of staff PPE, and extensive cleaning of exam room while observing appropriate contact time as indicated for disinfecting solutions.  I'm seeing this patient by the request  of:  [name], MD  CC: Right leg pain follow-up  INO:[id number]  12/21/2020 Patient is having right leg pain that the symptoms be secondary to likely deconditioning.  Differential does include lumbar radiculopathy and patient is clinically neuropathy.  Patient does have peripheral vascular disease as well as CLL significant for low back need to rule out any blood clot with a Doppler today.  With patient having a CLL do not think that the D-dimer would be accurate.  Patient will do thigh compression sleeve, heel lift, home exercises.  We will get x-rays of the lumbar spine as well.  Worsening pain seek medical attention immediately.  Follow-up with me again 6 weeks otherwise.  Updated 02/06/2021 CHADD TOLLISON is a 76 y.o. male coming in with complaint of right leg pain. Pain has improved. Has pain today due to cold weather. Started to feel good about 2 weeks ago. Stretches in mornings for pain.  Patient did have a Doppler that did not show any type of blood clot.  Doppler did have a mild cystic area noted in the popliteal fossa.   Xray IMPRESSION: 1. No acute findings. 2. Mild symmetric osteoarthritic change of the hips.  IMPRESSION: Moderate spondylosis of the lumbar spine with disc disease from the L2-3 level to the L5-S1 level greater at the L4-5 level. Possible subtle grade 1 anterolisthesis of L3 on L4.     Past Medical History:  Diagnosis Date   Acrophobia     Alcoholic cirrhosis ([place]) 9/62/8366   Anemia    Arthritis    foot by big toe   BPH associated with nocturia    Cataract    removed both eyes   Chronic cough    PMH of   COVID-19 12/2018   Diabetes mellitus without complication [facility])    Diverticulosis 07-03-2010   Colonoscopy.    Duodenal ulcer 2017   Fallen arches    Bilateral   GERD (gastroesophageal reflux disease)    Gout    Granuloma annulare    Hx of adenomatous colonic polyps multiple   Hydrocele 2011   Large septated right hydrocele   Liver cyst    Liver lesion    Nonspecific elevation of levels of transaminase or lactic acid dehydrogenase (LDH)    Obesity    Peripheral neuropathy    Plantar fasciitis    PMH of   Portal hypertension ([place]) 2017   Prostate cancer ([place])    Right shoulder pain 11/2017   Sleep apnea    no cpap, patient denies   Thiamine deficiency    Wears reading eyeglasses    WPW (Wolff-Parkinson-White syndrome) 02/05/2019   Past Surgical History:  Procedure Laterality Date   CATARACT EXTRACTION, BILATERAL  12/2011    Dr Gershon Crane   COLONOSCOPY  2017   COLONOSCOPY W/ POLYPECTOMY  07/03/2010   2 adenomas, diverticulosis on right. Dr [name]   CYSTOSCOPY N/A 03/15/2018   Procedure: [name];  Surgeon: [name], MD;  Location: [facility];  Service: Urology;  Laterality: N/A;  NO SEEDS FOUND IN BLADDER   ESOPHAGOGASTRODUODENOSCOPY (EGD) WITH PROPOFOL N/A 01/08/2016   Procedure: ESOPHAGOGASTRODUODENOSCOPY (EGD) WITH PROPOFOL;  Surgeon: [name], MD;  Location: [facility];  Service: Endoscopy;  Laterality: N/A;   [place]   RADIOACTIVE SEED IMPLANT N/A 03/15/2018   Procedure: RADIOACTIVE SEED IMPLANT/BRACHYTHERAPY IMPLANT;  Surgeon: [name], MD;  Location: [facility];  Service: Urology;  Laterality: N/A;   73 SEEDS IMPLANTED   SIGMOIDOSCOPY     SPACE OAR INSTILLATION N/A 03/15/2018   Procedure: SPACE OAR  INSTILLATION;  Surgeon: [name], MD;  Location: [facility];  Service: Urology;  Laterality: N/A;   WISDOM TOOTH EXTRACTION     Social History   Socioeconomic History   Marital status: Married    Spouse name: [name]   Number of children: 2   Years of education: Not on file   Highest education level: Some college, no degree  Occupational History   Occupation: Adult nurse estate  Tobacco Use   Smoking status: Former    Packs/day: 2.00    Years: 6.00    Pack years: 12.00    Types: Cigarettes    Quit date: 02/04/1971    Years since quitting: 50.0   Smokeless tobacco: Never   Tobacco comments:    smoked age [age], up to 2 ppd  Vaping Use   Vaping Use: Never used  Substance and Sexual Activity   Alcohol use: Not Currently    Alcohol/week: 28.0 standard drinks    Types: 28 Shots of liquor per week    Comment: No alochol since Christmas Day 2020   Drug use: No   Sexual activity: Yes    Partners: Female  Other Topics Concern   Not on file  Social History Narrative   Worked in Adult nurse estate   2 children   Former smoker no drug use   Alcoholism, abstinent since 01/23/2019 most recently   Fun/Hobby: Play golf, grandchildren, [employer]      Patient is left-handed. He lives with his wife in a one level home. He swims most days.   Social Determinants of Health   Financial Resource Strain: Medium Risk   Difficulty of Paying Living Expenses: Somewhat hard  Food Insecurity: Not on file  Transportation Needs: Not on file  Physical Activity: Not on file  Stress: Not on file  Social Connections: Not on file   Allergies  Allergen Reactions   Rifaximin Nausea And Vomiting    Required EMS visit, although patient did not go to hospital   Beta Adrenergic Blockers     Likely WPW.  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  Use with caution.   Family History  Problem Relation Age of Onset   Lung cancer Mother        smoker    Leukemia Father        Acute myelocytic   Diabetes Neg Hx    Stroke Neg Hx    Heart disease Neg Hx    Colon cancer Neg Hx    Colon polyps Neg Hx    Esophageal cancer Neg Hx    Rectal cancer Neg Hx    Stomach cancer Neg Hx     Current Outpatient Medications (Endocrine & Metabolic):    dapagliflozin propanediol (FARXIGA) 10 MG TABS tablet, Take 1 tablet (10 mg total) by mouth daily before breakfast.  insulin glargine, 2 Unit Dial, (TOUJEO MAX SOLOSTAR) 300 UNIT/ML Solostar Pen, Inject 30 Units into the skin daily. Via [facility] pt assistance (Patient taking differently: Inject 50 Units into the skin daily. Via SANOFI pt assistance)   metFORMIN (GLUCOPHAGE) 500 MG tablet, TAKE 1 TABLET BY MOUTH 2 TIMES DAILY WITH A MEAL.   predniSONE (DELTASONE) 20 MG tablet, Take 1 tablet (20 mg total) by mouth daily with breakfast.  Current Outpatient Medications (Cardiovascular):    tadalafil (CIALIS) 20 MG tablet, Take 1 tablet (20 mg total) by mouth daily as needed for erectile dysfunction.   Current Outpatient Medications (Analgesics):    Colchicine (MITIGARE) 0.6 MG CAPS, Take 1 tablet by mouth daily.  Current Outpatient Medications (Hematological):    folic acid (FOLVITE) 1 MG tablet, TAKE 1 TABLET BY MOUTH EVERY DAY  Current Outpatient Medications (Other):    blood glucose meter kit and supplies KIT, Use to test blood sugar once daily. DX E11.8   cyclobenzaprine (FLEXERIL) 5 MG tablet, Take 1 tablet (5 mg total) by mouth 3 (three) times daily as needed for muscle spasms.   gabapentin (NEURONTIN) 300 MG capsule, Take 1 capsule (300 mg total) by mouth 3 (three) times daily.   glucose blood (COOL BLOOD GLUCOSE TEST STRIPS) test strip, Use to check blood sugar daily. DX: E11.8   Lancets (ACCU-CHEK MULTICLIX) lancets, Use to check sugar daily. DX: E11.8   Multiple Vitamin (MULTIVITAMIN WITH MINERALS) TABS tablet, Take 1 tablet by mouth daily.   pantoprazole (PROTONIX) 40 MG tablet, TAKE 1 TABLET 30  MINUTES BEFORE MEALS (BREAKFAST AND SUPPER) TWICE A DAY   Reviewed prior external information including notes and imaging from  primary care provider As well as notes that were available from care everywhere and other healthcare systems.  Past medical history, social, surgical and family history all reviewed in electronic medical record.  No pertanent information unless stated regarding to the chief complaint.   Review of Systems:  No headache, visual changes, nausea, vomiting, diarrhea, constipation, dizziness, abdominal pain, skin rash, fevers, chills, night sweats, weight loss, swollen lymph nodes joint swelling, chest pain, shortness of breath, mood changes. POSITIVE muscle aches, body aches  Objective  Blood pressure 124/64, pulse 100, height 6' 2"  (1.88 m), weight 252 lb (114.3 kg), SpO2 97 %.   General: No apparent distress alert and oriented x3 mood and affect normal, dressed appropriately.  HEENT: Pupils equal, extraocular movements intact  Respiratory: Patient's speak in full sentences and does not appear short of breath  Cardiovascular: No lower extremity edema, non tender, no erythema  Gait normal with good balance and coordination.  MSK: Low back exam does have some loss lordosis.  Patient does have tightness with straight leg test.  Patient does have some limitation in certain range of motion. Patient still has tightness with straight leg test. Right knee does have what appears a very small Baker's cyst possibly noted in the popliteal area.  Nontender on exam.  Neurovascular intact distally.    Impression and Recommendations:     The above documentation has been reviewed and is accurate and complete Lyndal Pulley, DO

## 2021-02-06 ENCOUNTER — Other Ambulatory Visit: Payer: Self-pay

## 2021-02-06 ENCOUNTER — Ambulatory Visit (INDEPENDENT_AMBULATORY_CARE_PROVIDER_SITE_OTHER): Payer: Medicare Other | Admitting: Family Medicine

## 2021-02-06 ENCOUNTER — Encounter: Payer: Self-pay | Admitting: Family Medicine

## 2021-02-06 VITALS — BP 124/64 | HR 100 | Ht 74.0 in | Wt 252.0 lb

## 2021-02-06 DIAGNOSIS — C801 Malignant (primary) neoplasm, unspecified: Secondary | ICD-10-CM

## 2021-02-06 DIAGNOSIS — Z794 Long term (current) use of insulin: Secondary | ICD-10-CM | POA: Diagnosis not present

## 2021-02-06 DIAGNOSIS — M79604 Pain in right leg: Secondary | ICD-10-CM | POA: Diagnosis not present

## 2021-02-06 DIAGNOSIS — D72819 Decreased white blood cell count, unspecified: Secondary | ICD-10-CM

## 2021-02-06 DIAGNOSIS — E559 Vitamin D deficiency, unspecified: Secondary | ICD-10-CM | POA: Diagnosis not present

## 2021-02-06 DIAGNOSIS — M2550 Pain in unspecified joint: Secondary | ICD-10-CM | POA: Diagnosis not present

## 2021-02-06 DIAGNOSIS — E118 Type 2 diabetes mellitus with unspecified complications: Secondary | ICD-10-CM

## 2021-02-06 DIAGNOSIS — E539 Vitamin B deficiency, unspecified: Secondary | ICD-10-CM

## 2021-02-06 DIAGNOSIS — K7030 Alcoholic cirrhosis of liver without ascites: Secondary | ICD-10-CM

## 2021-02-06 LAB — COMPREHENSIVE METABOLIC PANEL
ALT: 11 U/L (ref 0–53)
AST: 19 U/L (ref 0–37)
Albumin: 4 g/dL (ref 3.5–5.2)
Alkaline Phosphatase: 62 U/L (ref 39–117)
BUN: 15 mg/dL (ref 6–23)
CO2: 27 mEq/L (ref 19–32)
Calcium: 9.1 mg/dL (ref 8.4–10.5)
Chloride: 104 mEq/L (ref 96–112)
Creatinine, Ser: 1.21 mg/dL (ref 0.40–1.50)
GFR: 58.25 mL/min — ABNORMAL LOW (ref >60.00)
Glucose, Bld: 233 mg/dL — ABNORMAL HIGH (ref 70–99)
Potassium: 4.3 mEq/L (ref 3.5–5.1)
Sodium: 140 mEq/L (ref 135–145)
Total Bilirubin: 1.3 mg/dL — ABNORMAL HIGH (ref 0.2–1.2)
Total Protein: 6.8 g/dL (ref 6.0–8.3)

## 2021-02-06 LAB — CBC WITH DIFFERENTIAL/PLATELET
Basophils Absolute: 0 10*3/uL (ref 0.0–0.1)
Basophils Relative: 0.2 % (ref 0.0–3.0)
Eosinophils Absolute: 0.1 10*3/uL (ref 0.0–0.7)
Eosinophils Relative: 0.9 % (ref 0.0–5.0)
HCT: 47.7 % (ref 39.0–52.0)
Hemoglobin: 16.3 g/dL (ref 13.0–17.0)
Lymphocytes Relative: 46 % (ref 12.0–46.0)
Lymphs Abs: 3.6 10*3/uL (ref 0.7–4.0)
MCHC: 34.1 g/dL (ref 30.0–36.0)
MCV: 94.8 fl (ref 78.0–100.0)
Monocytes Absolute: 0.5 10*3/uL (ref 0.1–1.0)
Monocytes Relative: 6.6 % (ref 3.0–12.0)
Neutro Abs: 3.6 10*3/uL (ref 1.4–7.7)
Neutrophils Relative %: 46.3 % (ref 43.0–77.0)
Platelets: 84 10*3/uL — ABNORMAL LOW (ref 150.0–400.0)
RBC: 5.03 Mil/uL (ref 4.22–5.81)
RDW: 14.3 % (ref 11.5–15.5)
WBC: 7.8 10*3/uL (ref 4.0–10.5)

## 2021-02-06 LAB — HEMOGLOBIN A1C: Hgb A1c MFr Bld: 7.5 % — ABNORMAL HIGH (ref 4.6–6.5)

## 2021-02-06 LAB — VITAMIN D 25 HYDROXY (VIT D DEFICIENCY, FRACTURES): VITD: 33.12 ng/mL (ref 30.00–100.00)

## 2021-02-06 LAB — SEDIMENTATION RATE: Sed Rate: 23 mm/hr — ABNORMAL HIGH (ref 0–20)

## 2021-02-06 LAB — FERRITIN: Ferritin: 41.8 ng/mL (ref 22.0–322.0)

## 2021-02-06 LAB — VITAMIN B12: Vitamin B-12: 348 pg/mL (ref 211–911)

## 2021-02-06 NOTE — Assessment & Plan Note (Signed)
We will get A1c.

## 2021-02-06 NOTE — Assessment & Plan Note (Signed)
Patient is making some improvement.  He does have a differential that is quite broad for this right lower leg.  Likely more of a neuropathy it seems to be.  Patient's ultrasound for any vascular compromise seems to be unremarkable.  Patient does have admittedly a small Baker's cyst noted of the ultrasound of the right knee that we could potentially consider the possibility of aspiration but I highly think this is unlikely to cause most of his discomfort.  Patient also has CLL and this could be contributing.  On x-rays do not see any type of cortical irregularity that would be is truly concerning at the moment.  If continuing to have difficulty I do think advanced imaging which would be an MRI of the lumbar and potentially MRI of the knee to further evaluate anything else that is contributing.  Patient has not had any fevers, chills, any abnormal weight loss.  Patient is in agreement with the plan and would like to hold on any type of imaging but would like some lab work

## 2021-02-06 NOTE — Patient Instructions (Addendum)
Labs today Stay active but worsening pain may need to consider MRI of back See you again in 7-8 weeks

## 2021-02-07 ENCOUNTER — Telehealth: Payer: Self-pay

## 2021-02-07 LAB — SAR COV2 SEROLOGY (COVID19)AB(IGG),IA
SARS-CoV-2 Semi-Quant IgG Ab: >800 AU/mL (ref <13.0)
SARS-CoV-2 Spike Ab Interp: POSITIVE

## 2021-02-07 NOTE — Telephone Encounter (Signed)
Left message for patient to call back  

## 2021-02-07 NOTE — Telephone Encounter (Signed)
Patient called wanting to talk about his blood work results. I gave him Dr.Smith's results, but he wanted to go over specifics.

## 2021-02-08 NOTE — Telephone Encounter (Signed)
Spoke with patient about labwork.

## 2021-02-20 ENCOUNTER — Telehealth: Payer: Self-pay

## 2021-02-20 NOTE — Progress Notes (Signed)
Chronic Care Management Pharmacy Assistant   Name: Patrick Kidd  MRN: [id number] DOB: Oct 03, 1945  Reason for Encounter: Disease State   Conditions to be addressed/monitored: DMII  Recent office visits:  None ID  Recent consult visits:  02/06/21 Lyndal Pulley, DO-Sports Medicine ([place]) Labs ordered, no med changes  12/21/20 Lyndal Pulley, DO-Sports Medicine (Right hip pain) Xray lumbar and R hip, heels lifts, med changes: Prednisone 72m 7 days  Hospital visits:  None in previous 6 months  Medications: Outpatient Encounter Medications as of 02/20/2021  Medication Sig Note   blood glucose meter kit and supplies KIT Use to test blood sugar once daily. DX E11.8    Colchicine (MITIGARE) 0.6 MG CAPS Take 1 tablet by mouth daily. 11/16/2019: PRN   cyclobenzaprine (FLEXERIL) 5 MG tablet Take 1 tablet (5 mg total) by mouth 3 (three) times daily as needed for muscle spasms.    dapagliflozin propanediol (FARXIGA) 10 MG TABS tablet Take 1 tablet (10 mg total) by mouth daily before breakfast.    folic acid (FOLVITE) 1 MG tablet TAKE 1 TABLET BY MOUTH EVERY DAY    gabapentin (NEURONTIN) 300 MG capsule Take 1 capsule (300 mg total) by mouth 3 (three) times daily.    glucose blood (COOL BLOOD GLUCOSE TEST STRIPS) test strip Use to check blood sugar daily. DX: E11.8    insulin glargine, 2 Unit Dial, (TOUJEO MAX SOLOSTAR) 300 UNIT/ML Solostar Pen Inject 30 Units into the skin daily. [facility] (Patient taking differently: Inject 50 Units into the skin daily. Via [facility]FI pt assistance)    Lancets (ACCU-CHEK MULTICLIX) lancets Use to check sugar daily. DX: E11.8    metFORMIN (GLUCOPHAGE) 500 MG tablet TAKE 1 TABLET BY MOUTH 2 TIMES DAILY WITH A MEAL.    Multiple Vitamin (MULTIVITAMIN WITH MINERALS) TABS tablet Take 1 tablet by mouth daily.    pantoprazole (PROTONIX) 40 MG tablet TAKE 1 TABLET 30 MINUTES BEFORE MEALS (BREAKFAST AND SUPPER) TWICE A DAY    predniSONE  (DELTASONE) 20 MG tablet Take 1 tablet (20 mg total) by mouth daily with breakfast.    tadalafil (CIALIS) 20 MG tablet Take 1 tablet (20 mg total) by mouth daily as needed for erectile dysfunction.    No facility-administered encounter medications on file as of 02/20/2021.   Recent Relevant Labs: Lab Results  Component Value Date/Time   HGBA1C 7.5 (H) 02/06/2021 02:43 PM   HGBA1C 8.3 (H) 08/28/2020 11:30 AM   MICROALBUR <0.7 09/25/2020 02:36 PM   MICROALBUR 3.4 (H) 03/14/2019 10:36 AM    Kidney Function Lab Results  Component Value Date/Time   CREATININE 1.21 02/06/2021 02:24 PM   CREATININE 1.22 11/08/2020 10:45 AM   CREATININE 1.39 (H) 10/04/2020 03:18 PM   CREATININE 1.13 06/04/2010 01:54 PM   GFR 58.25 (L) 02/06/2021 02:24 PM   GFRNONAA >60 11/08/2020 10:45 AM   GFRAA >60 02/06/2019 05:46 AM    Current antihyperglycemic regimen:  Metformin 500 mg Toujeo 30 units Farxiga 10 mg daily (via PAP)  What recent interventions/DTPs have been made to improve glycemic control:  None noted  Have there been any recent hospitalizations or ED visits since last visit with CPP? No  Patient denies hypoglycemic symptoms, including None  Patient denies hyperglycemic symptoms, including none  How often are you checking your blood sugar? Patient states that he checks blood sugar once a week  What are your blood sugars ranging? 120-160  During the week, how often does your  blood glucose drop below 70? Never  Are you checking your feet daily/regularly? Patient states that he does not have any swelling, redness or open sores not healing  Adherence Review: Is the patient currently on a STATIN medication? No Is the patient currently on ACE/ARB medication? No Does the patient have >5 day gap between last estimated fill dates? No   Care Gaps: Colonoscopy-03/31/19 Diabetic Foot Exam-09/25/20 Ophthalmology-04/26/20 Dexa Scan - NA Annual Well Visit - NA Micro albumin-09/25/20 Hemoglobin  A1c- 02/06/21  Star Rating Drugs: Metformin 500 mg-last fill 12/13/20 90 ds  Ethelene Hal Clinical Pharmacist Assistant [phone]

## 2021-02-27 ENCOUNTER — Other Ambulatory Visit: Payer: Self-pay | Admitting: Internal Medicine

## 2021-02-27 DIAGNOSIS — E118 Type 2 diabetes mellitus with unspecified complications: Secondary | ICD-10-CM

## 2021-03-07 ENCOUNTER — Ambulatory Visit (INDEPENDENT_AMBULATORY_CARE_PROVIDER_SITE_OTHER): Payer: Medicare Other

## 2021-03-07 ENCOUNTER — Other Ambulatory Visit: Payer: Self-pay | Admitting: Internal Medicine

## 2021-03-07 DIAGNOSIS — E785 Hyperlipidemia, unspecified: Secondary | ICD-10-CM

## 2021-03-07 DIAGNOSIS — E118 Type 2 diabetes mellitus with unspecified complications: Secondary | ICD-10-CM

## 2021-03-07 DIAGNOSIS — E119 Type 2 diabetes mellitus without complications: Secondary | ICD-10-CM

## 2021-03-07 DIAGNOSIS — Z794 Long term (current) use of insulin: Secondary | ICD-10-CM

## 2021-03-07 MED ORDER — FARXIGA 10 MG PO TABS: 10.0000 mg | ORAL_TABLET | Freq: Every day | ORAL | 0 refills | Status: DC

## 2021-03-07 NOTE — Patient Instructions (Signed)
Visit Information  Following are the goals we discussed today:   Track and Monitor My Blood Sugars   Timeframe:  Long-Range Goal Priority:  High Start Date:   03/07/2021                          Expected End Date:   03/07/2022                    Follow Up Date 09/04/2021   - check blood sugar at prescribed times - check blood sugar if I feel it is too high or too low - enter blood sugar readings and medication or insulin into daily log - take the blood sugar log to all doctor visits - take the blood sugar meter to all doctor visits    Why is this important?   Checking your blood sugar at home helps to keep it from getting very high or very low.  Writing the results in a diary or log helps the doctor know how to care for you.  Your blood sugar log should have the time, date and the results.  Also, write down the amount of insulin or other medicine that you take.  Other information, like what you ate, exercise done and how you were feeling, will also be helpful.    Plan: Telephone follow up appointment with care management team member scheduled for:  6 months  The patient has been provided with contact information for the care management team and has been advised to call with any health related questions or concerns.   Tomasa Blase, PharmD Clinical Pharmacist, [facility]   Please call the care guide team at [phone] if you need to cancel or reschedule your appointment.   Patient verbalizes understanding of instructions and care plan provided today and agrees to view in [place]. Active MyChart status confirmed with patient.

## 2021-03-07 NOTE — Progress Notes (Signed)
Chronic Care Management Pharmacy Note  03/07/2021 Name:  Patrick Kidd MRN:  [id number] DOB:  06-Jun-1945   Summary: - Patient reports that he has been doing well, A1c improved with last check - down to 7.5% - denies any issues with current medications - no issues with hypoglycemia  -Reports that he is following closely with sports medicine regarding back and leg pain - biggest issue at this time is leg weakness - unable to start for >45 minutes at a time, has difficulty moving from sitting to standing position   Recommendations/Changes made from today's visit: - Recommending no changes to medications at this time, patient agreeable to PT referral - will follow up with sports medicine about MRI should pain / weakness persis  Subjective: Patrick Kidd is an 76 y.o. year old male who is a primary patient of [name], MD.  The CCM team was consulted for assistance with disease management and care coordination needs.    Engaged with patient by telephone for follow up visit in response to provider referral for pharmacy case management and/or care coordination services.   Consent to Services:  The patient was given information about Chronic Care Management services, agreed to services, and gave verbal consent prior to initiation of services.  Please see initial visit note for detailed documentation.   Patient Care Team: [name], MD as PCP - General (Internal Medicine) Lorretta Harp, MD as PCP - Cardiology (Cardiology) Pieter Partridge, DO as Consulting Physician (Neurology) Charlton Haws, [facility] as Pharmacist (Pharmacist)  Recent office visits: 09/25/20 - Dr. Ronnald Ramp - started on tresiba and mirabegron 16m daily   Recent consult visits: 02/06/2021 - Dr. STamala Julian- Sports Medicine - right leg pain - no changes to medication - consider MRI in future for back if pain persists  12/21/2020 - Dr. STamala Julian- sports medicine - right hip pain - xray of lumbar and r hip ordered -  prednisone 268mx 7 days   Hospital visits: None in previous 6 months  Objective:  Lab Results  Component Value Date   CREATININE 1.21 02/06/2021   BUN 15 02/06/2021   GFR 58.25 (L) 02/06/2021   GFRNONAA >60 11/08/2020   GFRAA >60 02/06/2019   NA 140 02/06/2021   K 4.3 02/06/2021   CALCIUM 9.1 02/06/2021   CO2 27 02/06/2021   GLUCOSE 233 (H) 02/06/2021    Lab Results  Component Value Date/Time   HGBA1C 7.5 (H) 02/06/2021 02:43 PM   HGBA1C 8.3 (H) 08/28/2020 11:30 AM   GFR 58.25 (L) 02/06/2021 02:24 PM   GFR 55.10 (L) 09/25/2020 02:36 PM   MICROALBUR <0.7 09/25/2020 02:36 PM   MICROALBUR 3.4 (H) 03/14/2019 10:36 AM    Last diabetic Eye exam:  Lab Results  Component Value Date/Time   HMDIABEYEEXA No Retinopathy 04/26/2020 02:11 PM    Last diabetic Foot exam: No results found for: HMDIABFOOTEX   Lab Results  Component Value Date   CHOL 186 03/14/2020   HDL 54.20 03/14/2020   LDLCALC 114 (H) 03/14/2020   TRIG 93.0 03/14/2020   CHOLHDL 3 03/14/2020    Hepatic Function Latest Ref Rng & Units 02/06/2021 11/08/2020 10/04/2020  Total Protein 6.0 - 8.3 g/dL 6.8 7.0 7.2  Albumin 3.5 - 5.2 g/dL 4.0 3.9 4.1  AST 0 - 37 U/L _0 ALT 0 - 53 U/L _1 Alk Phosphatase 39 - 117 U/L 62 75 78  Total Bilirubin 0.2 -  1.2 mg/dL 1.3(H) 0.9 1.1  Bilirubin, Direct 0.0 - 0.3 mg/dL - - -    Lab Results  Component Value Date/Time   TSH 1.92 03/14/2020 01:48 PM   TSH 1.830 01/29/2019 05:00 AM   TSH 1.63 11/11/2017 11:48 AM   FREET4 1.08 03/10/2014 02:07 PM    CBC Latest Ref Rng & Units 02/06/2021 11/08/2020 10/04/2020  WBC 4.0 - 10.5 K/uL 7.8 8.8 9.7  Hemoglobin 13.0 - 17.0 g/dL 16.3 17.1(H) 16.9  Hematocrit 39.0 - 52.0 % 47.7 48.8 47.4  Platelets 150.0 - 400.0 K/uL 84.0(L) 99(L) 109(L)   Lab Results  Component Value Date/Time   VD25OH 33.12 02/06/2021 02:24 PM    Clinical ASCVD: No  The 10-year ASCVD risk score (Arnett DK, et al., 2019) is: 41.1%   Values used to  calculate the score:     Age: 89 years     Sex: Male     Is Non-Hispanic African American: No     Diabetic: Yes     Tobacco smoker: No     Systolic Blood Pressure: 740 mmHg     Is BP treated: No     HDL Cholesterol: 54.2 mg/dL     Total Cholesterol: 186 mg/dL    Depression screen [facility] 2/9 09/25/2020 04/14/2019 03/14/2019  Decreased Interest 0 0 0  Down, Depressed, Hopeless 0 0 0  PHQ - 2 Score 0 0 0  Some recent data might be hidden     Social History   Tobacco Use  Smoking Status Former   Packs/day: 2.00   Years: 6.00   Pack years: 12.00   Types: Cigarettes   Quit date: 02/04/1971   Years since quitting: 50.1  Smokeless Tobacco Never  Tobacco Comments   smoked age [age], up to 2 ppd   BP Readings from Last 3 Encounters:  02/06/21 124/64  12/21/20 120/82  11/08/20 (!) 148/76   Pulse Readings from Last 3 Encounters:  02/06/21 100  12/21/20 87  11/08/20 83   Wt Readings from Last 3 Encounters:  02/06/21 252 lb (114.3 kg)  12/21/20 251 lb (113.9 kg)  11/08/20 251 lb (113.9 kg)   BMI Readings from Last 3 Encounters:  02/06/21 32.35 kg/m  12/21/20 32.23 kg/m  11/08/20 32.23 kg/m    Assessment/Interventions: Review of patient past medical history, allergies, medications, health status, including review of consultants reports, laboratory and other test data, was performed as part of comprehensive evaluation and provision of chronic care management services.   SDOH:  (Social Determinants of Health) assessments and interventions performed: Yes    CCM Care Plan  Allergies  Allergen Reactions   Rifaximin Nausea And Vomiting    Required EMS visit, although patient did not go to hospital   Beta Adrenergic Blockers     Likely WPW.  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  Use with caution.    Medications Reviewed Today     Reviewed by Tomasa Blase, [facility] (Pharmacist) on 03/07/21 at [id number]  Med List Status: <None>   Medication Order Taking?  Sig Documenting Provider Last Dose Status Informant  blood glucose meter kit and supplies KIT [id number]  Use to test blood sugar once daily. DX E11.8 [name], MD  Active Self  Colchicine (MITIGARE) 0.6 MG CAPS [id number]  Take 1 tablet by mouth daily. [name], MD  Active            Med Note (CONGER, MELISSA I  Wed Nov 16, 2019 10:47 AM) PRN  dapagliflozin propanediol (FARXIGA) 10 MG TABS tablet [id number] Yes Take 1 tablet (10 mg total) by mouth daily before breakfast. [name], MD Taking Active   folic acid (FOLVITE) 1 MG tablet [id number] Yes TAKE 1 TABLET BY MOUTH EVERY DAY [name], MD Taking Active   gabapentin (NEURONTIN) 300 MG capsule [id number] Yes Take 1 capsule (300 mg total) by mouth 3 (three) times daily. Pieter Partridge, DO Taking Active   glucose blood (COOL BLOOD GLUCOSE TEST STRIPS) test strip [id number] Yes Use to check blood sugar daily. DX: E11.8 [name], MD Taking Active   insulin glargine, 2 Unit Dial, (TOUJEO MAX SOLOSTAR) 300 UNIT/ML Solostar Pen [id number] Yes Inject 30 Units into the skin daily. Via [facility] pt assistance  Patient taking differently: Inject 50 Units into the skin daily. Via [employer] pt assistance   [name], MD Taking Active   Lancets ([place]) lancets [id number] Yes Use to check sugar daily. DX: E11.8 [name], MD Taking Active Self  metFORMIN (GLUCOPHAGE) 500 MG tablet [id number] Yes TAKE 1 TABLET BY MOUTH 2 TIMES DAILY WITH A MEAL. [name], MD Taking Active   Multiple Vitamin (MULTIVITAMIN WITH MINERALS) TABS tablet [id number] Yes Take 1 tablet by mouth daily. Shelly Coss, MD Taking Active   pantoprazole (PROTONIX) 40 MG tablet [id number] Yes TAKE 1 TABLET 30 MINUTES BEFORE MEALS (BREAKFAST AND SUPPER) TWICE A DAY [name], MD Taking Active   tadalafil (CIALIS) 20 MG tablet [id number] Yes Take 1 tablet (20 mg total) by mouth daily as needed for erectile dysfunction. [name], MD  Taking Active   vitamin B-12 (CYANOCOBALAMIN) 1000 MCG tablet [id number] Yes Take 1,000 mcg by mouth daily. [provider] Taking Active             Patient Active Problem List   Diagnosis Date Noted   Right leg pain 12/21/2020   Chronic lymphocytic leukemia ([place]) 11/08/2020   Atypical lymphocytes present on peripheral blood smear 09/26/2020   Insulin-requiring or dependent type II diabetes mellitus ([place]) 09/25/2020   Primary stabbing headache 03/15/2020   Stage 3a chronic kidney disease ([place]) 03/15/2020   Nocturnal headaches 03/14/2020   Thiamine deficiency    Paroxysmal atrial fibrillation ([place]) 07/19/2019   Allergic rhinitis 06/09/2019   Erectile dysfunction due to diabetes mellitus ([place]) 06/09/2019   Overweight with body mass index (BMI) of 28 to 28.9 in adult 03/17/2019   WPW (Wolff-Parkinson-White syndrome) 02/05/2019   SBP (spontaneous bacterial peritonitis) (HCC)-December 2020,     Encephalopathy, hepatic    Degenerative disc disease, cervical 07/21/2018   Cervical radiculopathy 07/16/2018   OAB (overactive bladder) 07/06/2018   Malignant neoplasm of prostate ([place]) 01/13/2018   Hyperlipidemia LDL goal <100 11/11/2017   Essential hypertension 11/11/2017   BPH associated with nocturia 11/11/2017   Erectile dysfunction due to arterial insufficiency 04/07/2017   Peripheral vascular disease ([place]) 12/87/8676   Alcoholic cirrhosis ([place]) 72/10/4707   Type 2 diabetes mellitus with complication, with long-term current use of insulin ([place]) 08/22/2015   Hypersomnolence 03/19/2014   Peripheral neuropathy 01/11/2013   Polyclonal gammopathy 01/11/2013   GERD 10/04/2009    Immunization History  Administered Date(s) Administered   Fluad Quad(high Dose 65+) 01/30/2019, 11/23/2019, 11/29/2020   Hep A / Hep B 11/02/2015   Influenza, High Dose Seasonal PF 10/28/2016, 11/11/2017   Influenza,inj,Quad PF,6+ Mos 01/10/2016   Moderna SARS-COV2 Booster Vaccination 12/13/2019    Moderna  Sars-Covid-2 Vaccination 02/13/2019, 03/16/2019   Pneumococcal Conjugate-13 09/04/2015   Pneumococcal Polysaccharide-23 06/05/2011, 03/14/2019   Tdap 06/03/2009, 10/28/2016, 06/28/2019    Conditions to be addressed/monitored:  Hyperlipidemia, Diabetes and Cirrhosis  Care Plan : [place]  Updates made by Tomasa Blase, RPH since 03/07/2021 12:00 AM     Problem: Hyperlipidemia, Diabetes and Cirrhosis   Priority: High     Long-Range Goal: Disease management   Start Date: 04/19/2020  Expected End Date: 10/20/2020  This Visit's Progress: On track  Recent Progress: On track  Priority: High  Note:   Current Barriers:  Unable to independently afford treatment regimen Unable to independently monitor therapeutic efficacy  Pharmacist Clinical Goal(s):  Patient will verbalize ability to afford treatment regimen achieve adherence to monitoring guidelines and medication adherence to achieve therapeutic efficacy maintain control of diabetes as evidenced by improved A1c  through collaboration with PharmD and provider.   Interventions: 1:1 collaboration with [name], MD regarding development and update of comprehensive plan of care as evidenced by provider attestation and co-signature Inter-disciplinary care team collaboration (see longitudinal plan of care) Comprehensive medication review performed; medication list updated in electronic medical record  Hyperlipidemia (LDL goal < 100) Not ideally controlled - pt was previously taking atorvastatin, but this was discontinued in summer 2021 by cardiologist due to cirrhosis diagnosis. LDL increased from 81 to 114 since stopping statin. LFTs, ammonia levels have been stable in normal range. Lab Results  Component Value Date   LDLCALC 114 (H) 03/14/2020  Current regimen:  No medications Interventions: Recommend to discuss restarting statin with GI and/or cardiology  Diabetes (A1c goal < 7%) Improved - patient  reports that he checks BG infrequently - ~2 times per month  Last A1c - 7.5%  Current regimen:  Toujeo Max 50 units daily (via PAP) Metformin 500 mg BID Farxiga 10 mg daily (via PAP) Interventions: Discussed A1c goal and benefits of medications Discussed progression of diabetes and low likelihood of control without insulin Recommend to continue current medication and check BG daily  Cirrhosis Controlled - per notes, currently well compensated. Patient is abstaining from alcohol. LFTS with last lab WNL Current regimen:  N/a Interventions: Recommended to follow up with gastroenterologist regarding necessity medication intervention  Patient Goals/Self-Care Activities Patient will:  - take medications as prescribed focus on medication adherence by routine check glucose daily, document, and provide at future appointments   Follow Up Plan: Telephone follow up appointment with care management team member scheduled for: 6 months        Medication Assistance:  Wilder Glade obtained through [place].  Enrollment ends 57/02/7791   -[employer] application for [employer] in process. Anticipated start date 05/20/20.  Patient's preferred pharmacy is:  [id number] [place], [place] [place]Phone: [phone]: [address] [address]Phone: [phone]: [phone] [place] [address]Phone: [phone]: [phone] [facility] [address]Phone: [phone]: [phone] [place] [place]Phone:  [phone]: [phone]  Uses pill box? No - prefers bottles Pt  endorses 100% compliance  We discussed: Current pharmacy is preferred with insurance plan and patient is satisfied with pharmacy services Patient decided to: Continue current medication management strategy  Care Plan and Follow Up Patient Decision:  Patient agrees to Care Plan and Follow-up.  Plan: Telephone follow up appointment with care management team member scheduled for:  3 months   Tomasa Blase, PharmD Clinical Pharmacist, [place]

## 2021-03-13 ENCOUNTER — Other Ambulatory Visit: Payer: Self-pay | Admitting: Internal Medicine

## 2021-03-13 ENCOUNTER — Telehealth: Payer: Self-pay

## 2021-03-13 DIAGNOSIS — N5201 Erectile dysfunction due to arterial insufficiency: Secondary | ICD-10-CM

## 2021-03-13 MED ORDER — TADALAFIL 20 MG PO TABS: 20.0000 mg | ORAL_TABLET | Freq: Every day | ORAL | 3 refills | Status: DC | PRN

## 2021-03-13 NOTE — Telephone Encounter (Signed)
Patient called requesting refill of cialis be sent to [facility] at [place], [place], [place] [address]  Please send if agreeable   Thanks,  Linna Hoff

## 2021-03-26 NOTE — Progress Notes (Signed)
Patrick Kidd [address] [place] Phone: [phone] Subjective:   IVilma Kidd, am serving as a scribe for Dr. Hulan Saas. This visit occurred during the SARS-CoV-2 public health emergency.  Safety protocols were in place, including screening questions prior to the visit, additional usage of staff PPE, and extensive cleaning of exam room while observing appropriate contact time as indicated for disinfecting solutions.   I'm seeing this patient by the request  of:  [name], MD  CC: Bilateral knee pain, leg pain  NBV:[id number]  02/06/2021 Patient is making some improvement.  He does have a differential that is quite broad for this right lower leg.  Likely more of a neuropathy it seems to be.  Patient's ultrasound for any vascular compromise seems to be unremarkable.  Patient does have admittedly a small Baker's cyst noted of the ultrasound of the right knee that we could potentially consider the possibility of aspiration but I highly think this is unlikely to cause most of his discomfort.  Patient also has CLL and this could be contributing.  On x-rays do not see any type of cortical irregularity that would be is truly concerning at the moment.  If continuing to have difficulty I do think advanced imaging which would be an MRI of the lumbar and potentially MRI of the knee to further evaluate anything else that is contributing.  Patient has not had any fevers, chills, any abnormal weight loss.  Patient is in agreement with the plan and would like to hold on any type of imaging but would like some lab work  Updated 03/27/2021 Patrick Kidd is a 76 y.o. male coming in with complaint of right leg pain. Pain in leg is worse. Wants to know about PT for knees. Can't stand for more than 30 mins without feeling some instability. Having problems with balance. Hamstring on right side is still bothersome. Pain radiating up into buttocks and back. No  other complaints.  Patient feels this is different than the blood flow in his legs which he has had difficulty with previously.     Past Medical History:  Diagnosis Date   Acrophobia    Alcoholic cirrhosis ([place]) 04/17/3886   Anemia    Arthritis    foot by big toe   BPH associated with nocturia    Cataract    removed both eyes   Chronic cough    PMH of   COVID-19 12/2018   Diabetes mellitus without complication [facility])    Diverticulosis 07-03-2010   Colonoscopy.    Duodenal ulcer 2017   Fallen arches    Bilateral   GERD (gastroesophageal reflux disease)    Gout    Granuloma annulare    Hx of adenomatous colonic polyps multiple   Hydrocele 2011   Large septated right hydrocele   Liver cyst    Liver lesion    Nonspecific elevation of levels of transaminase or lactic acid dehydrogenase (LDH)    Obesity    Peripheral neuropathy    Plantar fasciitis    PMH of   Portal hypertension ([place]) 2017   Prostate cancer ([place])    Right shoulder pain 11/2017   Sleep apnea    no cpap, patient denies   Thiamine deficiency    Wears reading eyeglasses    WPW (Wolff-Parkinson-White syndrome) 02/05/2019   Past Surgical History:  Procedure Laterality Date   CATARACT EXTRACTION, BILATERAL  12/2011    Dr Gershon Crane  COLONOSCOPY  2017   COLONOSCOPY W/ POLYPECTOMY  07/03/2010   2 adenomas, diverticulosis on right. Dr [name]   CYSTOSCOPY N/A 03/15/2018   Procedure: [name];  Surgeon: [name], MD;  Location: [facility];  Service: Urology;  Laterality: N/A;  NO SEEDS FOUND IN BLADDER   ESOPHAGOGASTRODUODENOSCOPY (EGD) WITH PROPOFOL N/A 01/08/2016   Procedure: ESOPHAGOGASTRODUODENOSCOPY (EGD) WITH PROPOFOL;  Surgeon: [name], MD;  [facility];  Service: Endoscopy;  Laterality: N/A;   [place]   RADIOACTIVE SEED IMPLANT N/A 03/15/2018   Procedure: RADIOACTIVE SEED IMPLANT/BRACHYTHERAPY IMPLANT;  Surgeon: [name],  MD;  Location: [place];  Service: Urology;  Laterality: N/A;   73 SEEDS IMPLANTED   SIGMOIDOSCOPY     SPACE OAR INSTILLATION N/A 03/15/2018   Procedure: SPACE OAR INSTILLATION;  Surgeon: [name], MD;  Location: [facility];  Service: Urology;  Laterality: N/A;   WISDOM TOOTH EXTRACTION     Social History   Socioeconomic History   Marital status: Married    Spouse name: Patrick Kidd   Number of children: 2   Years of education: Not on file   Highest education level: Some college, no degree  Occupational History   Occupation: Adult nurse estate  Tobacco Use   Smoking status: Former    Packs/day: 2.00    Years: 6.00    Pack years: 12.00    Types: Cigarettes    Quit date: 02/04/1971    Years since quitting: 50.1   Smokeless tobacco: Never   Tobacco comments:    smoked age [age], up to 2 ppd  Vaping Use   Vaping Use: Never used  Substance and Sexual Activity   Alcohol use: Not Currently    Alcohol/week: 28.0 standard drinks    Types: 28 Shots of liquor per week    Comment: No alochol since Christmas Day 2020   Drug use: No   Sexual activity: Yes    Partners: Female  Other Topics Concern   Not on file  Social History Narrative   Worked in Adult nurse estate   2 children   Former smoker no drug use   Alcoholism, abstinent since 01/23/2019 most recently   Fun/Hobby: Play golf, grandchildren, [employer]      Patient is left-handed. He lives with his wife in a one level home. He swims most days.   Social Determinants of Health   Financial Resource Strain: Medium Risk   Difficulty of Paying Living Expenses: Somewhat hard  Food Insecurity: Not on file  Transportation Needs: Not on file  Physical Activity: Not on file  Stress: Not on file  Social Connections: Not on file   Allergies  Allergen Reactions   Rifaximin Nausea And Vomiting    Required EMS visit, although patient did not go to hospital   Beta Adrenergic Blockers      Likely WPW.  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  Use with caution.   Family History  Problem Relation Age of Onset   Lung cancer Mother        smoker   Leukemia Father        Acute myelocytic   Diabetes Neg Hx    Stroke Neg Hx    Heart disease Neg Hx    Colon cancer Neg Hx    Colon polyps Neg Hx    Esophageal cancer Neg Hx  Rectal cancer Neg Hx    Stomach cancer Neg Hx     Current Outpatient Medications (Endocrine & Metabolic):    FARXIGA 10 MG TABS tablet, Take 1 tablet (10 mg total) by mouth daily before breakfast.   insulin glargine, 2 Unit Dial, (TOUJEO MAX SOLOSTAR) 300 UNIT/ML Solostar Pen, Inject 30 Units into the skin daily. Via [facility] pt assistance (Patient taking differently: Inject 50 Units into the skin daily. Via SANOFI pt assistance)   metFORMIN (GLUCOPHAGE) 500 MG tablet, TAKE 1 TABLET BY MOUTH 2 TIMES DAILY WITH A MEAL.  Current Outpatient Medications (Cardiovascular):    tadalafil (CIALIS) 20 MG tablet, Take 1 tablet (20 mg total) by mouth daily as needed for erectile dysfunction.   Current Outpatient Medications (Analgesics):    Colchicine (MITIGARE) 0.6 MG CAPS, Take 1 tablet by mouth daily.  Current Outpatient Medications (Hematological):    folic acid (FOLVITE) 1 MG tablet, TAKE 1 TABLET BY MOUTH EVERY DAY   vitamin B-12 (CYANOCOBALAMIN) 1000 MCG tablet, Take 1,000 mcg by mouth daily.  Current Outpatient Medications (Other):    blood glucose meter kit and supplies KIT, Use to test blood sugar once daily. DX E11.8   gabapentin (NEURONTIN) 300 MG capsule, Take 1 capsule (300 mg total) by mouth 3 (three) times daily.   glucose blood (COOL BLOOD GLUCOSE TEST STRIPS) test strip, Use to check blood sugar daily. DX: E11.8   Lancets (ACCU-CHEK MULTICLIX) lancets, Use to check sugar daily. DX: E11.8   Multiple Vitamin (MULTIVITAMIN WITH MINERALS) TABS tablet, Take 1 tablet by mouth daily.   pantoprazole (PROTONIX) 40 MG tablet, TAKE 1  TABLET 30 MINUTES BEFORE MEALS (BREAKFAST AND SUPPER) TWICE A DAY   Reviewed prior external information including notes and imaging from  primary care provider As well as notes that were available from care everywhere and other healthcare systems.  Past medical history, social, surgical and family history all reviewed in electronic medical record.  No pertanent information unless stated regarding to the chief complaint.   Review of Systems:  No headache, visual changes, nausea, vomiting, diarrhea, constipation, dizziness, abdominal pain, skin rash, fevers, chills, night sweats, weight loss, swollen lymph nodes, body aches, joint swelling, chest pain, shortness of breath, mood changes. POSITIVE muscle aches  Objective  Blood pressure 128/80, pulse 88, height 6' 2"  (1.88 m), weight 249 lb (112.9 kg), SpO2 95 %.   General: No apparent distress alert and oriented x3 mood and affect normal, dressed appropriately.  HEENT: Pupils equal, extraocular movements intact  Respiratory: Patient's speak in full sentences and does not appear short of breath  Cardiovascular: No lower extremity edema, non tender, no erythema patient does have multiple areas of bruising. Low back exam significant loss of lordosis.  Patient has only 5 degrees of extension. Patient's knees bilaterally do have some tenderness to palpation medial joint line.  Patient does have some narrowing noted of the medial joint line, mild instability with valgus and varus force.  No significant inflammation noted noted of the patellofemoral joint.  Limited muscular skeletal ultrasound was performed and interpreted by Hulan Saas, M  Limited ultrasound of patient's knees bilaterally show the patient does have some medial narrowing of the medial joint space line left greater than right.  Patient does have a Baker's cyst noted on the right knee  Impression: knee arthritis   After informed written and verbal consent, patient was seated on  exam table. Right knee was prepped with alcohol swab and utilizing anterolateral approach, patient's right  knee space was injected with 4:1  marcaine 0.5%: Kenalog 54m/dL. Patient tolerated the procedure well without immediate complications.  After informed written and verbal consent, patient was seated on exam table. Left knee was prepped with alcohol swab and utilizing anterolateral approach, patient's left knee space was injected with 4:1  marcaine 0.5%: Kenalog 471mdL. Patient tolerated the procedure well without immediate complications.    Impression and Recommendations:     The above documentation has been reviewed and is accurate and complete ZaLyndal PulleyDO

## 2021-03-27 ENCOUNTER — Ambulatory Visit (INDEPENDENT_AMBULATORY_CARE_PROVIDER_SITE_OTHER): Payer: Medicare Other | Admitting: Family Medicine

## 2021-03-27 ENCOUNTER — Encounter: Payer: Self-pay | Admitting: Family Medicine

## 2021-03-27 ENCOUNTER — Other Ambulatory Visit: Payer: Self-pay

## 2021-03-27 ENCOUNTER — Ambulatory Visit: Payer: Self-pay

## 2021-03-27 VITALS — BP 128/80 | HR 88 | Ht 74.0 in | Wt 249.0 lb

## 2021-03-27 DIAGNOSIS — G6289 Other specified polyneuropathies: Secondary | ICD-10-CM

## 2021-03-27 DIAGNOSIS — M79604 Pain in right leg: Secondary | ICD-10-CM | POA: Diagnosis not present

## 2021-03-27 DIAGNOSIS — M170 Bilateral primary osteoarthritis of knee: Secondary | ICD-10-CM | POA: Insufficient documentation

## 2021-03-27 DIAGNOSIS — M545 Low back pain, unspecified: Secondary | ICD-10-CM

## 2021-03-27 DIAGNOSIS — M7121 Synovial cyst of popliteal space [Baker], right knee: Secondary | ICD-10-CM | POA: Diagnosis not present

## 2021-03-27 NOTE — Patient Instructions (Signed)
Great to see you  Injected knees today  PT at [facility]  Will get approval for gel injections  MRI of the back and pelvis to further evaluate what is going on and if injection in the back could help  See me again in 4-6 weeks

## 2021-03-27 NOTE — Assessment & Plan Note (Signed)
Bilateral injections given today, will get x-rays to further evaluate.  Patient should monitor relatively well.  Could be a candidate for viscosupplementation and we will try to get approval for this as well.  Discussed icing regimen and home exercises, increase activity slowly.  Follow-up again in 6 weeks

## 2021-03-27 NOTE — Assessment & Plan Note (Signed)
Patient does have some peripheral neuropathy at this point.  Patient has had difficulty but we will start him in formal physical therapy and I do think that it will improve.  Patient has responded well to gabapentin previously but concerned to increase the amount anymore at this moment.  Discussed with patient about that there is a possibility for some lumbar neuropathy as well.  Patient does not have pain in the back but still with patient's past medical history significant for malignant prostate cancer as well as CLL I do want to get further imaging with MRI of the back and pelvis.  Depending on this could be a candidate for possible epidurals.  Follow-up with me again in 4 to 6 weeks.

## 2021-04-02 DIAGNOSIS — E119 Type 2 diabetes mellitus without complications: Secondary | ICD-10-CM

## 2021-04-02 DIAGNOSIS — Z794 Long term (current) use of insulin: Secondary | ICD-10-CM

## 2021-04-02 DIAGNOSIS — E785 Hyperlipidemia, unspecified: Secondary | ICD-10-CM

## 2021-04-03 ENCOUNTER — Other Ambulatory Visit: Payer: Self-pay

## 2021-04-03 ENCOUNTER — Ambulatory Visit (INDEPENDENT_AMBULATORY_CARE_PROVIDER_SITE_OTHER): Payer: Medicare Other | Admitting: Physical Therapy

## 2021-04-03 ENCOUNTER — Encounter: Payer: Self-pay | Admitting: Physical Therapy

## 2021-04-03 DIAGNOSIS — M6281 Muscle weakness (generalized): Secondary | ICD-10-CM | POA: Diagnosis not present

## 2021-04-03 DIAGNOSIS — M25561 Pain in right knee: Secondary | ICD-10-CM

## 2021-04-03 DIAGNOSIS — M25562 Pain in left knee: Secondary | ICD-10-CM

## 2021-04-03 DIAGNOSIS — R2689 Other abnormalities of gait and mobility: Secondary | ICD-10-CM

## 2021-04-03 NOTE — Therapy (Signed)
OUTPATIENT PHYSICAL THERAPY  EVALUATION   Patient Name: Patrick Kidd MRN: [id number] DOB:03-29-1945, 76 y.o., male Today's Date: 04/04/2021   PT End of Session - 04/04/21 0950     Visit Number 1    Number of Visits 16    Date for PT Re-Evaluation 05/29/21    Authorization Type Medicare    PT Start Time 1017    PT Stop Time 1100    PT Time Calculation (min) 43 min    Activity Tolerance Patient tolerated treatment well    Behavior During Therapy [facility] for tasks assessed/performed             Past Medical History:  Diagnosis Date   Acrophobia    Alcoholic cirrhosis ([place]) 9/39/0300   Anemia    Arthritis    foot by big toe   BPH associated with nocturia    Cataract    removed both eyes   Chronic cough    PMH of   COVID-19 12/2018   Diabetes mellitus without complication ([place])    Diverticulosis 07-03-2010   Colonoscopy.    Duodenal ulcer 2017   Fallen arches    Bilateral   GERD (gastroesophageal reflux disease)    Gout    Granuloma annulare    Hx of adenomatous colonic polyps multiple   Hydrocele 2011   Large septated right hydrocele   Liver cyst    Liver lesion    Nonspecific elevation of levels of transaminase or lactic acid dehydrogenase (LDH)    Obesity    Peripheral neuropathy    Plantar fasciitis    PMH of   Portal hypertension ([place]) 2017   Prostate cancer ([place])    Right shoulder pain 11/2017   Sleep apnea    no cpap, patient denies   Thiamine deficiency    Wears reading eyeglasses    WPW (Wolff-Parkinson-White syndrome) 02/05/2019   Past Surgical History:  Procedure Laterality Date   CATARACT EXTRACTION, BILATERAL  12/2011    Dr Gershon Crane   COLONOSCOPY  2017   COLONOSCOPY W/ POLYPECTOMY  07/03/2010   2 adenomas, diverticulosis on right. Dr [name]   CYSTOSCOPY N/A 03/15/2018   Procedure: [name];  Surgeon: [name], MD;  Location: [facility];  Service: Urology;  Laterality: N/A;  NO SEEDS FOUND IN BLADDER    ESOPHAGOGASTRODUODENOSCOPY (EGD) WITH PROPOFOL N/A 01/08/2016   Procedure: ESOPHAGOGASTRODUODENOSCOPY (EGD) WITH PROPOFOL;  Surgeon: [name], MD;  [facility];  Service: Endoscopy;  Laterality: N/A;   [place]   RADIOACTIVE SEED IMPLANT N/A 03/15/2018   Procedure: RADIOACTIVE SEED IMPLANT/BRACHYTHERAPY IMPLANT;  Surgeon: [name], MD;  Location: [place];  Service: Urology;  Laterality: N/A;   73 SEEDS IMPLANTED   SIGMOIDOSCOPY     SPACE OAR INSTILLATION N/A 03/15/2018   Procedure: SPACE OAR INSTILLATION;  Surgeon: [name], MD;  Location: [facility];  Service: Urology;  Laterality: N/A;   WISDOM TOOTH EXTRACTION     Patient Active Problem List   Diagnosis Date Noted   Degenerative arthritis of knee, bilateral 03/27/2021   Right leg pain 12/21/2020   Chronic lymphocytic leukemia ([place]) 11/08/2020   Atypical lymphocytes present on peripheral blood smear 09/26/2020   Insulin-requiring or dependent type II diabetes mellitus ([place]) 09/25/2020   Primary stabbing headache 03/15/2020   Stage 3a chronic kidney disease ([place]) 03/15/2020   Nocturnal headaches 03/14/2020   Thiamine deficiency    Paroxysmal  atrial fibrillation ([place]) 07/19/2019   Allergic rhinitis 06/09/2019   Erectile dysfunction due to diabetes mellitus ([place]) 06/09/2019   Overweight with body mass index (BMI) of 28 to 28.9 in adult 03/17/2019   WPW (Wolff-Parkinson-White syndrome) 02/05/2019   SBP (spontaneous bacterial peritonitis) (H[facility])-December 2020,     Encephalopathy, hepatic    Degenerative disc disease, cervical 07/21/2018   Cervical radiculopathy 07/16/2018   OAB (overactive bladder) 07/06/2018   Malignant neoplasm of prostate ([place]) 01/13/2018   Hyperlipidemia LDL goal <100 11/11/2017   Essential hypertension 11/11/2017   BPH associated with nocturia 11/11/2017   Erectile dysfunction due to arterial insufficiency 04/07/2017    Peripheral vascular disease ([place]) 39/04/90   Alcoholic cirrhosis ([place]) 33/00/7622   Type 2 diabetes mellitus with complication, with long-term current use of insulin ([place]) 08/22/2015   Hypersomnolence 03/19/2014   Peripheral neuropathy 01/11/2013   Polyclonal gammopathy 01/11/2013   GERD 10/04/2009    PCP: [name], MD  REFERRING PROVIDER: Charlann Boxer  REFERRING DIAG: Bil Knee pain/OA, lumbar pain  THERAPY DIAG:  Muscle weakness (generalized)  Other abnormalities of gait and mobility  Acute pain of left knee  Acute pain of right knee  ONSET DATE: 1-2 yrs ago  SUBJECTIVE:   SUBJECTIVE STATEMENT: Pt had hospital stay in 2020 from [place] where he was very ill. Feels he has not recovered from that. States increased weakness and immobility.  Reports worsening bil knee pain,  HS tight and decreased balance.  Knee pain bil L>R, did have recent injections, that have helped some, has braces that he wears at times. Pt reports standing for about 25 min  feels like he is going to fall due to weak knees/legs.  No falls.  He reports no back pain at this time. He does have testing for knees and back ordered for near future. Wants to play golf 9 holes. Does have CLL - reports being exhausted at times. Also reports previous HS tear (distal) on R.    PERTINENT HISTORY:  Chronic lymphocytic leukemia  Peripheral vascular disease   Alcoholic cirrhosis   Peripheral neuopathy DM OA     PAIN:  Are you having pain? Yes NPRS scale: 2/10 Pain location: Bil Knees.  Pain orientation: Right and Left  PAIN TYPE: aching Pain description: intermittent  Aggravating factors: Standing, activity, stairs. Relieving factors: none stated   PRECAUTIONS: None  WEIGHT BEARING RESTRICTIONS No  FALLS:  Has patient fallen in last 6 months? No, Number of falls: 0  OCCUPATION: Retired  PLOF: Independent  PATIENT GOALS  Play 9 holes of golf, no falls, be able to step up onto friends boat/yacht.     OBJECTIVE:  COGNITION:  Overall cognitive status: Within functional limits for tasks assessed     SENSATION: Decreased sensation in feet, with some hypersensitivity in lower legs.  MUSCLE LENGTH: HS: minimal limitation   PALPATION: Mild tenderness in popliteal region on R, mild tenderness at distal HS.  ROM 04/03/21:  Lumbar: mod limitation for flex and ext, Hips: mild limitation for flex and rotation, Knees: WFL  LE MMT: 04/03/21:  Hips: 4-/5, Knees: 4/5,   LOWER EXTREMITY SPECIAL TESTS:  Balance to be tested next visit.    TODAY'S TREATMENT: Ther ex: see below for HEP   PATIENT EDUCATION:  Education details: PT POC, Exam findings, HEP Person educated: Patient Education method: Explanation, Demonstration, Tactile cues, Verbal cues, and Handouts Education comprehension: verbalized understanding, returned demonstration, verbal cues required, tactile cues required, and needs further education  HOME EXERCISE PROGRAM: Access Code: [email or web address] URL: [email or web address] Date: 04/03/2021 Prepared by: Lyndee Hensen  Exercises Supine Single Knee to Chest Stretch - 2 x daily - 3 reps - 30 hold Supine Lower Trunk Rotation - 1 x daily - 10 reps - 5 hold Straight Leg Raise - 1 x daily - 1-2 sets - 10 reps Seated Knee Extension AROM - 1 x daily - 2 sets - 10 reps Seated Hamstring Stretch - 2 x daily - 3 reps - 30 hold   ASSESSMENT:  CLINICAL IMPRESSION: Pt presents with primary complaint of increased pain in bil knees, consistent with OA. He has decreased strength in hips, knees, and is overall deconditioned. He has some ROM limitations for back and hips. He has lack of effective HEP, and will benefit from education on HEP progressing to gym routine. Pt reports poor balance and poor confidence with dynamic activity, stairs, and has some fear of falling. Balance testing to be done next visit. Pt with multiple deficits, and will benefit from skilled PT to improve  deficits and pain. Pt has lumbar testing ordered, which may be helpful to rule out leg weakness caused by lumbar pathology.    OBJECTIVE IMPAIRMENTS Abnormal gait, decreased activity tolerance, decreased balance, decreased cognition, decreased endurance, decreased mobility, difficulty walking, decreased ROM, decreased strength, decreased safety awareness, increased muscle spasms, impaired flexibility, impaired sensation, improper body mechanics, obesity, and pain.   ACTIVITY LIMITATIONS cleaning, community activity, occupation, yard work, and shopping.   PERSONAL FACTORS Fitness are also affecting patient's functional outcome.    REHAB POTENTIAL: Good  CLINICAL DECISION MAKING: Stable/uncomplicated  EVALUATION COMPLEXITY: Low   GOALS: Goals reviewed with patient? Yes  SHORT TERM GOALS:  STG Name Target Date Goal status  1 Patient will be independent with initial HEP   04/17/21 INITIAL  2 Pt to demo improved ROM for  L - spine to be [facility] for flexion.  04/17/21 INITIAL         LONG TERM GOALS:   LTG Name Target Date Goal status  1 Patient will be independent with final HEP  05/29/21 INITIAL  2 Pt to report decreased pain in bil knees to 0-2/10   05/29/21 INITIAL  3 Pt to demonstrate improved strength of bil hips and knee to be at least 4+/5, to improve   05/29/21 INITIAL  4 Pt to demonstrate ability for standing/walking activity for at least 30 min without pain or weakness in LEs., to improve ability for community activity.  05/29/21 INITIAL  5 Pt to demo dynamic balance to be WNL for pt age, to improve safety and ability for functional activity   05/29/21 INITIAL     PLAN: PT FREQUENCY: 2x/week  PT DURATION: 8 weeks  PLANNED INTERVENTIONS: Therapeutic exercises, Therapeutic activity, Neuromuscular re-education, Balance training, Gait training, Patient/Family education, Joint manipulation, Joint mobilization, Stair training, DME instructions, Dry Needling, Electrical  stimulation, Spinal manipulation, Spinal mobilization, Cryotherapy, Moist heat, Taping, Vasopneumatic device, Traction, Ultrasound, Ionotophoresis 4mg /ml Dexamethasone, and Manual therapy  PLAN FOR NEXT SESSION:   Lyndee Hensen, PT, DPT 9:53 AM  04/04/21

## 2021-04-04 ENCOUNTER — Encounter: Payer: Self-pay | Admitting: Physical Therapy

## 2021-04-08 ENCOUNTER — Ambulatory Visit (INDEPENDENT_AMBULATORY_CARE_PROVIDER_SITE_OTHER): Payer: Medicare Other | Admitting: Physical Therapy

## 2021-04-08 ENCOUNTER — Encounter: Payer: Self-pay | Admitting: Physical Therapy

## 2021-04-08 DIAGNOSIS — R2689 Other abnormalities of gait and mobility: Secondary | ICD-10-CM | POA: Diagnosis not present

## 2021-04-08 DIAGNOSIS — M6281 Muscle weakness (generalized): Secondary | ICD-10-CM | POA: Diagnosis not present

## 2021-04-08 DIAGNOSIS — M25561 Pain in right knee: Secondary | ICD-10-CM

## 2021-04-08 DIAGNOSIS — M25562 Pain in left knee: Secondary | ICD-10-CM | POA: Diagnosis not present

## 2021-04-08 NOTE — Therapy (Signed)
OUTPATIENT PHYSICAL THERAPY TREATMENT   Patient Name: Patrick Kidd MRN: [id number] DOB:11/19/1945, 76 y.o., male Today's Date: 04/08/2021   PT End of Session - 04/09/21 3419     Visit Number 2    Number of Visits 16    Date for PT Re-Evaluation 05/29/21    Authorization Type Medicare    PT Start Time 1601    PT Stop Time 1645    PT Time Calculation (min) 44 min    Equipment Utilized During Treatment Gait belt    Activity Tolerance Patient tolerated treatment well    Behavior During Therapy WFL for tasks assessed/performed              Past Medical History:  Diagnosis Date   Acrophobia    Alcoholic cirrhosis ([place]) 3/79/0240   Anemia    Arthritis    foot by big toe   BPH associated with nocturia    Cataract    removed both eyes   Chronic cough    PMH of   COVID-19 12/2018   Diabetes mellitus without complication ([place])    Diverticulosis 07-03-2010   Colonoscopy.    Duodenal ulcer 2017   Fallen arches    Bilateral   GERD (gastroesophageal reflux disease)    Gout    Granuloma annulare    Hx of adenomatous colonic polyps multiple   Hydrocele 2011   Large septated right hydrocele   Liver cyst    Liver lesion    Nonspecific elevation of levels of transaminase or lactic acid dehydrogenase (LDH)    Obesity    Peripheral neuropathy    Plantar fasciitis    PMH of   Portal hypertension ([place]) 2017   Prostate cancer ([place])    Right shoulder pain 11/2017   Sleep apnea    no cpap, patient denies   Thiamine deficiency    Wears reading eyeglasses    WPW (Wolff-Parkinson-White syndrome) 02/05/2019   Past Surgical History:  Procedure Laterality Date   CATARACT EXTRACTION, BILATERAL  12/2011    Dr Gershon Crane   COLONOSCOPY  2017   COLONOSCOPY W/ POLYPECTOMY  07/03/2010   2 adenomas, diverticulosis on right. Dr [name]   CYSTOSCOPY N/A 03/15/2018   Procedure: [name];  Surgeon: [name], MD;  Location: [facility];  Service:  Urology;  Laterality: N/A;  NO SEEDS FOUND IN BLADDER   ESOPHAGOGASTRODUODENOSCOPY (EGD) WITH PROPOFOL N/A 01/08/2016   Procedure: ESOPHAGOGASTRODUODENOSCOPY (EGD) WITH PROPOFOL;  Surgeon: [name], MD;  Location[facility];  Service: Endoscopy;  Laterality: N/A;   [place]   RADIOACTIVE SEED IMPLANT N/A 03/15/2018   Procedure: RADIOACTIVE SEED IMPLANT/BRACHYTHERAPY IMPLANT;  Surgeon: [name], MD;  Location: [place];  Service: Urology;  Laterality: N/A;   73 SEEDS IMPLANTED   SIGMOIDOSCOPY     SPACE OAR INSTILLATION N/A 03/15/2018   Procedure: SPACE OAR INSTILLATION;  Surgeon: [name], MD;  Location: [facility];  Service: Urology;  Laterality: N/A;   WISDOM TOOTH EXTRACTION     Patient Active Problem List   Diagnosis Date Noted   Degenerative arthritis of knee, bilateral 03/27/2021   Right leg pain 12/21/2020   Chronic lymphocytic leukemia ([place]) 11/08/2020   Atypical lymphocytes present on peripheral blood smear 09/26/2020   Insulin-requiring or dependent type II diabetes mellitus ([place]) 09/25/2020   Primary stabbing headache 03/15/2020   Stage 3a chronic kidney disease ([place]) 03/15/2020   Nocturnal headaches  03/14/2020   Thiamine deficiency    Paroxysmal atrial fibrillation ([place]) 07/19/2019   Allergic rhinitis 06/09/2019   Erectile dysfunction due to diabetes mellitus ([place]) 06/09/2019   Overweight with body mass index (BMI) of 28 to 28.9 in adult 03/17/2019   WPW (Wolff-Parkinson-White syndrome) 02/05/2019   SBP (spontaneous bacterial peritonitis) (HCC)-December 2020,     Encephalopathy, hepatic    Degenerative disc disease, cervical 07/21/2018   Cervical radiculopathy 07/16/2018   OAB (overactive bladder) 07/06/2018   Malignant neoplasm of prostate ([place]) 01/13/2018   Hyperlipidemia LDL goal <100 11/11/2017   Essential hypertension 11/11/2017   BPH associated with nocturia 11/11/2017   Erectile  dysfunction due to arterial insufficiency 04/07/2017   Peripheral vascular disease ([place]) 50/10/3816   Alcoholic cirrhosis ([place]) 29/93/7169   Type 2 diabetes mellitus with complication, with long-term current use of insulin ([place]) 08/22/2015   Hypersomnolence 03/19/2014   Peripheral neuropathy 01/11/2013   Polyclonal gammopathy 01/11/2013   GERD 10/04/2009    PCP: [name], MD  REFERRING PROVIDER: Charlann Boxer  REFERRING DIAG: Bil Knee pain/OA, lumbar pain  THERAPY DIAG:  Muscle weakness (generalized)  Other abnormalities of gait and mobility  Acute pain of left knee  Acute pain of right knee  ONSET DATE: 1-2 yrs ago  SUBJECTIVE:   SUBJECTIVE STATEMENT: Pt states improving knee pain from injections. Has been doing HEP. Still feels very weak and poor confidence with his balance.   PERTINENT HISTORY:  Chronic lymphocytic leukemia  Peripheral vascular disease   Alcoholic cirrhosis   Peripheral neuopathy DM OA     PAIN:  Are you having pain? Yes NPRS scale: 2/10 Pain location: Bil Knees.  Pain orientation: Right and Left  PAIN TYPE: aching Pain description: intermittent  Aggravating factors: Standing, activity, stairs. Relieving factors: none stated   PRECAUTIONS: None  WEIGHT BEARING RESTRICTIONS No  FALLS:  Has patient fallen in last 6 months? No, Number of falls: 0  OCCUPATION: Retired  PLOF: Independent  PATIENT GOALS  Play 9 holes of golf, no falls, be able to step up onto friends boat/yacht.    OBJECTIVE:  COGNITION:  Overall cognitive status: Within functional limits for tasks assessed     SENSATION: Decreased sensation in feet, with some hypersensitivity in lower legs.  MUSCLE LENGTH: HS: minimal limitation   PALPATION: Mild tenderness in popliteal region on R, mild tenderness at distal HS.  ROM 04/03/21:  Lumbar: mod limitation for flex and ext, Hips: mild limitation for flex and rotation, Knees: WFL  LE MMT: 04/03/21:  Hips:  4-/5, Knees: 4/5,   LOWER EXTREMITY SPECIAL TESTS:   [facility] PT Assessment - 04/09/21 0001       Standardized Balance Assessment   Standardized Balance Assessment Berg Balance Test      Berg Balance Test   Sit to Stand Able to stand without using hands and stabilize independently    Standing Unsupported Able to stand safely 2 minutes    Sitting with Back Unsupported but Feet Supported on Floor or Stool Able to sit safely and securely 2 minutes    Stand to Sit Controls descent by using hands    Transfers Able to transfer safely, definite need of hands    Standing Unsupported with Eyes Closed Able to stand 10 seconds with supervision    Standing Unsupported with Feet Together Able to place feet together independently but unable to hold for 30 seconds    From Standing, Reach Forward with Outstretched Arm Can reach forward >12 cm  safely (5")    From Standing Position, Pick up Object from [place] to pick up shoe safely and easily    From Standing Position, Turn to Look Behind Over each Shoulder Looks behind one side only/other side shows less weight shift    Turn 360 Degrees Able to turn 360 degrees safely one side only in 4 seconds or less    Standing Unsupported, Alternately Place Feet on Step/Stool Able to complete 4 steps without aid or supervision    Standing Unsupported, One Foot in Front Able to take small step independently and hold 30 seconds    Standing on One Leg Tries to lift leg/unable to hold 3 seconds but remains standing independently    Total Score 41               TODAY'S TREATMENT: 04/08/21: Therapeutic Exercise: Aerobic:   Supine: Seated:  Sit to stand, higher mat table x 10;  LAQ 5 lb x 15 bil;  Standing: Marching x 15; HR x 20;  Stretches:  Seated HS stretch 30 sec x 3 bil; LTR x 15; SKTC 30 sec x 2 bil;  Neuromuscular Re-education: L/R and A/P weight shifts x 25 ea; tandem stance (modified) 20 sec x 3 bil; Toe taps x 15; Step ups 6 in x 10 bil; Stairs up/down  x 3, practice for step to pattern for knee pain.  Manual Therapy: Therapeutic Activity:    PATIENT EDUCATION:  Education details: updated and reviewed HEP.  Person educated: Patient Education method: Explanation, Demonstration, Tactile cues, Verbal cues, and Handouts Education comprehension: verbalized understanding, returned demonstration, verbal cues required, tactile cues required, and needs further education   HOME EXERCISE PROGRAM: Access Code: [email or web address] Updated 04/08/21   ASSESSMENT:  CLINICAL IMPRESSION: 04/08/21: Pt started on LE ther ex for strengthening today. Also started with initial balance exercises- pt challenged with this, especially for posterior weight shifts. He has decreased ability for stair climbing, due to weakness and instability in knees. Pt to benefit from ongoing strengthening and balance for improving functional activity and safety.   OBJECTIVE IMPAIRMENTS Abnormal gait, decreased activity tolerance, decreased balance, decreased cognition, decreased endurance, decreased mobility, difficulty walking, decreased ROM, decreased strength, decreased safety awareness, increased muscle spasms, impaired flexibility, impaired sensation, improper body mechanics, obesity, and pain.   ACTIVITY LIMITATIONS cleaning, community activity, occupation, yard work, and shopping.   PERSONAL FACTORS Fitness are also affecting patient's functional outcome.    REHAB POTENTIAL: Good  CLINICAL DECISION MAKING: Stable/uncomplicated  EVALUATION COMPLEXITY: Low   GOALS: Goals reviewed with patient? Yes  SHORT TERM GOALS:  STG Name Target Date Goal status  1 Patient will be independent with initial HEP   04/17/21 INITIAL  2 Pt to demo improved ROM for  L - spine to be [facility] for flexion.  04/17/21 INITIAL         LONG TERM GOALS:   LTG Name Target Date Goal status  1 Patient will be independent with final HEP  05/29/21 INITIAL  2 Pt to report decreased pain in bil knees to  0-2/10   05/29/21 INITIAL  3 Pt to demonstrate improved strength of bil hips and knee to be at least 4+/5, to improve   05/29/21 INITIAL  4 Pt to demonstrate ability for standing/walking activity for at least 30 min without pain or weakness in LEs., to improve ability for community activity.  05/29/21 INITIAL  5 Pt to demo dynamic balance to be WNL for pt age, to improve safety and  ability for functional activity   05/29/21 INITIAL     PLAN: PT FREQUENCY: 2x/week  PT DURATION: 8 weeks  PLANNED INTERVENTIONS: Therapeutic exercises, Therapeutic activity, Neuromuscular re-education, Balance training, Gait training, Patient/Family education, Joint manipulation, Joint mobilization, Stair training, DME instructions, Dry Needling, Electrical stimulation, Spinal manipulation, Spinal mobilization, Cryotherapy, Moist heat, Taping, Vasopneumatic device, Traction, Ultrasound, Ionotophoresis '4mg'$ /ml Dexamethasone, and Manual therapy  PLAN FOR NEXT SESSION:   Lyndee Hensen, PT, DPT 8:23 AM  04/09/21

## 2021-04-09 ENCOUNTER — Other Ambulatory Visit: Payer: Self-pay

## 2021-04-09 ENCOUNTER — Telehealth: Payer: Self-pay | Admitting: Internal Medicine

## 2021-04-09 ENCOUNTER — Ambulatory Visit
Admission: RE | Admit: 2021-04-09 | Discharge: 2021-04-09 | Disposition: A | Discharge status: Home / Self Care | Payer: Medicare Other | Source: Ambulatory Visit | Attending: Family Medicine | Admitting: Family Medicine

## 2021-04-09 ENCOUNTER — Encounter: Payer: Self-pay | Admitting: Physical Therapy

## 2021-04-09 DIAGNOSIS — M48061 Spinal stenosis, lumbar region without neurogenic claudication: Secondary | ICD-10-CM | POA: Diagnosis not present

## 2021-04-09 DIAGNOSIS — M545 Low back pain, unspecified: Secondary | ICD-10-CM

## 2021-04-09 DIAGNOSIS — M160 Bilateral primary osteoarthritis of hip: Secondary | ICD-10-CM | POA: Diagnosis not present

## 2021-04-09 DIAGNOSIS — M4316 Spondylolisthesis, lumbar region: Secondary | ICD-10-CM | POA: Diagnosis not present

## 2021-04-09 DIAGNOSIS — M47816 Spondylosis without myelopathy or radiculopathy, lumbar region: Secondary | ICD-10-CM | POA: Diagnosis not present

## 2021-04-09 DIAGNOSIS — R29898 Other symptoms and signs involving the musculoskeletal system: Secondary | ICD-10-CM | POA: Diagnosis not present

## 2021-04-09 DIAGNOSIS — M5127 Other intervertebral disc displacement, lumbosacral region: Secondary | ICD-10-CM | POA: Diagnosis not present

## 2021-04-09 NOTE — Telephone Encounter (Signed)
LVM for pt to rtn my call to schedule AWV with [facility]. Please scheduled AWV if pt calls the office.  ?

## 2021-04-10 ENCOUNTER — Ambulatory Visit (INDEPENDENT_AMBULATORY_CARE_PROVIDER_SITE_OTHER): Payer: Medicare Other | Admitting: Physical Therapy

## 2021-04-10 DIAGNOSIS — M6281 Muscle weakness (generalized): Secondary | ICD-10-CM

## 2021-04-10 DIAGNOSIS — M25562 Pain in left knee: Secondary | ICD-10-CM | POA: Diagnosis not present

## 2021-04-10 DIAGNOSIS — R2689 Other abnormalities of gait and mobility: Secondary | ICD-10-CM | POA: Diagnosis not present

## 2021-04-10 DIAGNOSIS — M25561 Pain in right knee: Secondary | ICD-10-CM

## 2021-04-10 NOTE — Therapy (Signed)
OUTPATIENT PHYSICAL THERAPY TREATMENT   Patient Name: EATHAN GROMAN MRN: [id number] DOB:Nov 29, 1945, 76 y.o., male Today's Date: 04/10/2021    PT End of Session - 04/11/21 1102     Visit Number 3    Number of Visits 16    Date for PT Re-Evaluation 05/29/21    Authorization Type Medicare    PT Start Time 1100    PT Stop Time 1144    PT Time Calculation (min) 44 min    Equipment Utilized During Treatment Gait belt    Activity Tolerance Patient tolerated treatment well    Behavior During Therapy WFL for tasks assessed/performed               Past Medical History:  Diagnosis Date   Acrophobia    Alcoholic cirrhosis ([place]) 05/12/8117   Anemia    Arthritis    foot by big toe   BPH associated with nocturia    Cataract    removed both eyes   Chronic cough    PMH of   COVID-19 12/2018   Diabetes mellitus without complication ([place])    Diverticulosis 07-03-2010   Colonoscopy.    Duodenal ulcer 2017   Fallen arches    Bilateral   GERD (gastroesophageal reflux disease)    Gout    Granuloma annulare    Hx of adenomatous colonic polyps multiple   Hydrocele 2011   Large septated right hydrocele   Liver cyst    Liver lesion    Nonspecific elevation of levels of transaminase or lactic acid dehydrogenase (LDH)    Obesity    Peripheral neuropathy    Plantar fasciitis    PMH of   Portal hypertension ([place]) 2017   Prostate cancer ([place])    Right shoulder pain 11/2017   Sleep apnea    no cpap, patient denies   Thiamine deficiency    Wears reading eyeglasses    WPW (Wolff-Parkinson-White syndrome) 02/05/2019   Past Surgical History:  Procedure Laterality Date   CATARACT EXTRACTION, BILATERAL  12/2011    Dr Gershon Crane   COLONOSCOPY  2017   COLONOSCOPY W/ POLYPECTOMY  07/03/2010   2 adenomas, diverticulosis on right. Dr [name]   CYSTOSCOPY N/A 03/15/2018   Procedure: [name];  Surgeon: [name], MD;  Location: [facility];  Service:  Urology;  Laterality: N/A;  NO SEEDS FOUND IN BLADDER   ESOPHAGOGASTRODUODENOSCOPY (EGD) WITH PROPOFOL N/A 01/08/2016   Procedure: ESOPHAGOGASTRODUODENOSCOPY (EGD) WITH PROPOFOL;  Surgeon: [name], MD;  [facility];  Service: Endoscopy;  Laterality: N/A;   [place]   RADIOACTIVE SEED IMPLANT N/A 03/15/2018   Procedure: RADIOACTIVE SEED IMPLANT/BRACHYTHERAPY IMPLANT;  Surgeon: [name], MD;  Location: [place];  Service: Urology;  Laterality: N/A;   73 SEEDS IMPLANTED   SIGMOIDOSCOPY     SPACE OAR INSTILLATION N/A 03/15/2018   Procedure: SPACE OAR INSTILLATION;  Surgeon: [name], MD;  Location: [facility];  Service: Urology;  Laterality: N/A;   WISDOM TOOTH EXTRACTION     Patient Active Problem List   Diagnosis Date Noted   Degenerative arthritis of knee, bilateral 03/27/2021   Right leg pain 12/21/2020   Chronic lymphocytic leukemia ([place]) 11/08/2020   Atypical lymphocytes present on peripheral blood smear 09/26/2020   Insulin-requiring or dependent type II diabetes mellitus ([place]) 09/25/2020   Primary stabbing headache 03/15/2020   Stage 3a chronic kidney disease ([place]) 03/15/2020  Nocturnal headaches 03/14/2020   Thiamine deficiency    Paroxysmal atrial fibrillation ([place]) 07/19/2019   Allergic rhinitis 06/09/2019   Erectile dysfunction due to diabetes mellitus ([place]) 06/09/2019   Overweight with body mass index (BMI) of 28 to 28.9 in adult 03/17/2019   WPW (Wolff-Parkinson-White syndrome) 02/05/2019   SBP (spontaneous bacterial peritonitis) (HCC)-December 2020,     Encephalopathy, hepatic    Degenerative disc disease, cervical 07/21/2018   Cervical radiculopathy 07/16/2018   OAB (overactive bladder) 07/06/2018   Malignant neoplasm of prostate ([place]) 01/13/2018   Hyperlipidemia LDL goal <100 11/11/2017   Essential hypertension 11/11/2017   BPH associated with nocturia 11/11/2017   Erectile  dysfunction due to arterial insufficiency 04/07/2017   Peripheral vascular disease ([place]) 38/46/6599   Alcoholic cirrhosis ([place]) 35/70/1779   Type 2 diabetes mellitus with complication, with long-term current use of insulin ([place]) 08/22/2015   Hypersomnolence 03/19/2014   Peripheral neuropathy 01/11/2013   Polyclonal gammopathy 01/11/2013   GERD 10/04/2009    PCP: [name], MD  REFERRING PROVIDER: Charlann Boxer  REFERRING DIAG: Bil Knee pain/OA, lumbar pain  THERAPY DIAG:  Muscle weakness (generalized)  Other abnormalities of gait and mobility  Acute pain of left knee  Acute pain of right knee  ONSET DATE: 1-2 yrs ago  SUBJECTIVE:   SUBJECTIVE STATEMENT: Pt had MRI for back. Does show stenosis and degeneration, pt with option to get injection in L-spine. He notes decreased pain in knees since knee injections, and less "weakness" feeling in LEs.    PERTINENT HISTORY:  Chronic lymphocytic leukemia  Peripheral vascular disease   Alcoholic cirrhosis   Peripheral neuopathy DM OA     PAIN:  Are you having pain? Yes NPRS scale: 2/10 Pain location: Bil Knees.  Pain orientation: Right and Left  PAIN TYPE: aching Pain description: intermittent  Aggravating factors: Standing, activity, stairs. Relieving factors: none stated   PRECAUTIONS: None  WEIGHT BEARING RESTRICTIONS No  FALLS:  Has patient fallen in last 6 months? No, Number of falls: 0  OCCUPATION: Retired  PLOF: Independent  PATIENT GOALS  Play 9 holes of golf, no falls, be able to step up onto friends boat/yacht.    OBJECTIVE:  COGNITION:  Overall cognitive status: Within functional limits for tasks assessed     SENSATION: Decreased sensation in feet, with some hypersensitivity in lower legs.  MUSCLE LENGTH: HS: minimal limitation   PALPATION: Mild tenderness in popliteal region on R, mild tenderness at distal HS.  ROM 04/03/21:  Lumbar: mod limitation for flex and ext, Hips: mild  limitation for flex and rotation, Knees: WFL  LE MMT: 04/03/21:  Hips: 4-/5, Knees: 4/5,   LOWER EXTREMITY SPECIAL TESTS:     TODAY'S TREATMENT:  04/10/21: Therapeutic Exercise: Aerobic:  Recumbent bike L2 x 8 min;  Supine: Seated:  Sit to stand, mat table x 10;   Standing:  Stretches:  Seated HS stretch 30 sec x 3 bil; Neuromuscular Re-education: L/R and A/P weight shifts x 25 ea; tandem stance (modified) 20 sec x 3 bil; Toe taps x 20;  Stairs up/down x 6. Manual Therapy: Therapeutic Activity:    04/08/21: Therapeutic Exercise: Aerobic:   Supine: Seated:  Sit to stand, higher mat table x 10;  LAQ 5 lb x 15 bil;  Standing: Marching x 15; HR x 20;  Stretches:  Seated HS stretch 30 sec x 3 bil; LTR x 15; SKTC 30 sec x 2 bil;  Neuromuscular Re-education: L/R and A/P weight shifts x 25  ea; tandem stance (modified) 20 sec x 3 bil; Toe taps x 15; Step ups 6 in x 10 bil; Stairs up/down x 3, practice for step to pattern for knee pain.  Manual Therapy: Therapeutic Activity:    PATIENT EDUCATION:  Education details: updated and reviewed HEP.  Person educated: Patient Education method: Explanation, Demonstration, Tactile cues, Verbal cues, and Handouts Education comprehension: verbalized understanding, returned demonstration, verbal cues required, tactile cues required, and needs further education   HOME EXERCISE PROGRAM: Access Code: [email or web address] Updated 04/08/21   ASSESSMENT:  CLINICAL IMPRESSION: 04/10/21: Pt with good ability for strengthening and ther ex today. Will require continued practice on stairs, for strength and safety/balance. Noted weakness and instability in L LE vs R with step ups and strengthening today. Discussed benefit of continuing strengthening and improving mobility overall, and seeing if it helps "weakness" feeling in legs. Discussed f/u with MD for other questions about need or timing for lumbar injection.  Pt did have increased pain in R posterior thigh while  laying flat on back for MRI (likely radicular pain)  but is not experiencing this on regular basis.    OBJECTIVE IMPAIRMENTS Abnormal gait, decreased activity tolerance, decreased balance, decreased cognition, decreased endurance, decreased mobility, difficulty walking, decreased ROM, decreased strength, decreased safety awareness, increased muscle spasms, impaired flexibility, impaired sensation, improper body mechanics, obesity, and pain.   ACTIVITY LIMITATIONS cleaning, community activity, occupation, yard work, and shopping.   PERSONAL FACTORS Fitness are also affecting patient's functional outcome.    REHAB POTENTIAL: Good  CLINICAL DECISION MAKING: Stable/uncomplicated  EVALUATION COMPLEXITY: Low   GOALS: Goals reviewed with patient? Yes  SHORT TERM GOALS:  STG Name Target Date Goal status  1 Patient will be independent with initial HEP   04/17/21 INITIAL  2 Pt to demo improved ROM for  L - spine to be [facility] for flexion.  04/17/21 INITIAL         LONG TERM GOALS:   LTG Name Target Date Goal status  1 Patient will be independent with final HEP  05/29/21 INITIAL  2 Pt to report decreased pain in bil knees to 0-2/10   05/29/21 INITIAL  3 Pt to demonstrate improved strength of bil hips and knee to be at least 4+/5, to improve   05/29/21 INITIAL  4 Pt to demonstrate ability for standing/walking activity for at least 30 min without pain or weakness in LEs., to improve ability for community activity.  05/29/21 INITIAL  5 Pt to demo dynamic balance to be WNL for pt age, to improve safety and ability for functional activity   05/29/21 INITIAL     PLAN: PT FREQUENCY: 2x/week  PT DURATION: 8 weeks  PLANNED INTERVENTIONS: Therapeutic exercises, Therapeutic activity, Neuromuscular re-education, Balance training, Gait training, Patient/Family education, Joint manipulation, Joint mobilization, Stair training, DME instructions, Dry Needling, Electrical stimulation, Spinal manipulation,  Spinal mobilization, Cryotherapy, Moist heat, Taping, Vasopneumatic device, Traction, Ultrasound, Ionotophoresis '4mg'$ /ml Dexamethasone, and Manual therapy  PLAN FOR NEXT SESSION: LE strengthening, balance, weight shifts, bwd walking.   Lyndee Hensen, PT, DPT 11:05 AM  04/11/21

## 2021-04-11 ENCOUNTER — Encounter: Payer: Self-pay | Admitting: Physical Therapy

## 2021-04-15 ENCOUNTER — Encounter: Payer: Self-pay | Admitting: Physical Therapy

## 2021-04-15 ENCOUNTER — Ambulatory Visit (INDEPENDENT_AMBULATORY_CARE_PROVIDER_SITE_OTHER): Payer: Medicare Other | Admitting: Physical Therapy

## 2021-04-15 DIAGNOSIS — R2689 Other abnormalities of gait and mobility: Secondary | ICD-10-CM

## 2021-04-15 DIAGNOSIS — M25561 Pain in right knee: Secondary | ICD-10-CM | POA: Diagnosis not present

## 2021-04-15 DIAGNOSIS — M6281 Muscle weakness (generalized): Secondary | ICD-10-CM | POA: Diagnosis not present

## 2021-04-15 DIAGNOSIS — M25562 Pain in left knee: Secondary | ICD-10-CM | POA: Diagnosis not present

## 2021-04-15 NOTE — Therapy (Signed)
OUTPATIENT PHYSICAL THERAPY TREATMENT   Patient Name: Patrick Kidd MRN: [id number] DOB:[birth year], 76 y.o., male Today's Date: 04/10/2021    PT End of Session - 04/15/21 1648     Visit Number 4    Number of Visits 16    Date for PT Re-Evaluation 05/29/21    Authorization Type Medicare    PT Start Time 1602    PT Stop Time 1645    PT Time Calculation (min) 43 min    Equipment Utilized During Treatment Gait belt    Activity Tolerance Patient tolerated treatment well    Behavior During Therapy WFL for tasks assessed/performed                Past Medical History:  Diagnosis Date   Acrophobia    Alcoholic cirrhosis ([place]) 9/67/8938   Anemia    Arthritis    foot by big toe   BPH associated with nocturia    Cataract    removed both eyes   Chronic cough    PMH of   COVID-19 12/2018   Diabetes mellitus without complication ([place])    Diverticulosis 07-03-2010   Colonoscopy.    Duodenal ulcer 2017   Fallen arches    Bilateral   GERD (gastroesophageal reflux disease)    Gout    Granuloma annulare    Hx of adenomatous colonic polyps multiple   Hydrocele 2011   Large septated right hydrocele   Liver cyst    Liver lesion    Nonspecific elevation of levels of transaminase or lactic acid dehydrogenase (LDH)    Obesity    Peripheral neuropathy    Plantar fasciitis    PMH of   Portal hypertension ([place]) 2017   Prostate cancer ([place])    Right shoulder pain 11/2017   Sleep apnea    no cpap, patient denies   Thiamine deficiency    Wears reading eyeglasses    WPW (Wolff-Parkinson-White syndrome) 02/05/2019   Past Surgical History:  Procedure Laterality Date   CATARACT EXTRACTION, BILATERAL  12/2011    Dr Gershon Crane   COLONOSCOPY  2017   COLONOSCOPY W/ POLYPECTOMY  07/03/2010   2 adenomas, diverticulosis on right. Dr [name]   CYSTOSCOPY N/A 03/15/2018   Procedure: [name];  Surgeon: [name], MD;  Location: [facility];  Service:  Urology;  Laterality: N/A;  NO SEEDS FOUND IN BLADDER   ESOPHAGOGASTRODUODENOSCOPY (EGD) WITH PROPOFOL N/A 01/08/2016   Procedure: ESOPHAGOGASTRODUODENOSCOPY (EGD) WITH PROPOFOL;  Surgeon: [name], MD;  Location[facility];  Service: Endoscopy;  Laterality: N/A;   [place]   RADIOACTIVE SEED IMPLANT N/A 03/15/2018   Procedure: RADIOACTIVE SEED IMPLANT/BRACHYTHERAPY IMPLANT;  Surgeon: [name], MD;  Location: [place];  Service: Urology;  Laterality: N/A;   73 SEEDS IMPLANTED   SIGMOIDOSCOPY     SPACE OAR INSTILLATION N/A 03/15/2018   Procedure: SPACE OAR INSTILLATION;  Surgeon: [name], MD;  Location[facility];  Service: Urology;  Laterality: N/A;   WISDOM TOOTH EXTRACTION     Patient Active Problem List   Diagnosis Date Noted   Degenerative arthritis of knee, bilateral 03/27/2021   Right leg pain 12/21/2020   Chronic lymphocytic leukemia ([place]) 11/08/2020   Atypical lymphocytes present on peripheral blood smear 09/26/2020   Insulin-requiring or dependent type II diabetes mellitus ([place]) 09/25/2020   Primary stabbing headache 03/15/2020   Stage 3a chronic kidney disease ([place]) 03/15/2020  Nocturnal headaches 03/14/2020   Thiamine deficiency    Paroxysmal atrial fibrillation ([place]) 07/19/2019   Allergic rhinitis 06/09/2019   Erectile dysfunction due to diabetes mellitus ([place]) 06/09/2019   Overweight with body mass index (BMI) of 28 to 28.9 in adult 03/17/2019   WPW (Wolff-Parkinson-White syndrome) 02/05/2019   SBP (spontaneous bacterial peritonitis) (HCC)-December 2020,     Encephalopathy, hepatic    Degenerative disc disease, cervical 07/21/2018   Cervical radiculopathy 07/16/2018   OAB (overactive bladder) 07/06/2018   Malignant neoplasm of prostate ([place]) 01/13/2018   Hyperlipidemia LDL goal <100 11/11/2017   Essential hypertension 11/11/2017   BPH associated with nocturia 11/11/2017   Erectile  dysfunction due to arterial insufficiency 04/07/2017   Peripheral vascular disease ([place]) 65/78/4696   Alcoholic cirrhosis ([place]) 29/52/8413   Type 2 diabetes mellitus with complication, with long-term current use of insulin ([place]) 08/22/2015   Hypersomnolence 03/19/2014   Peripheral neuropathy 01/11/2013   Polyclonal gammopathy 01/11/2013   GERD 10/04/2009    PCP: [name], MD  REFERRING PROVIDER: Charlann Boxer  REFERRING DIAG: Bil Knee pain/OA, lumbar pain  THERAPY DIAG:  Muscle weakness (generalized)  Other abnormalities of gait and mobility  Acute pain of left knee  Acute pain of right knee  ONSET DATE: 1-2 yrs ago  SUBJECTIVE:   SUBJECTIVE STATEMENT: Pt states a few more times of "feeling" pain in R posterior thigh, when laying flat on back.    PERTINENT HISTORY:  Chronic lymphocytic leukemia  Peripheral vascular disease   Alcoholic cirrhosis   Peripheral neuopathy DM OA     PAIN:  Are you having pain? Yes NPRS scale: 2/10 Pain location: Bil Knees.  Pain orientation: Right and Left  PAIN TYPE: aching Pain description: intermittent  Aggravating factors: Standing, activity, stairs. Relieving factors: none stated   PRECAUTIONS: None  WEIGHT BEARING RESTRICTIONS No  FALLS:  Has patient fallen in last 6 months? No, Number of falls: 0  OCCUPATION: Retired  PLOF: Independent  PATIENT GOALS  Play 9 holes of golf, no falls, be able to step up onto friends boat/yacht.    OBJECTIVE:  COGNITION:  Overall cognitive status: Within functional limits for tasks assessed     SENSATION: Decreased sensation in feet, with some hypersensitivity in lower legs.  MUSCLE LENGTH: HS: minimal limitation   PALPATION: Mild tenderness in popliteal region on R, mild tenderness at distal HS.  ROM 04/03/21:  Lumbar: mod limitation for flex and ext, Hips: mild limitation for flex and rotation, Knees: WFL  LE MMT: 04/03/21:  Hips: 4-/5, Knees: 4/5,   LOWER  EXTREMITY SPECIAL TESTS:     TODAY'S TREATMENT:  04/15/21: Therapeutic Exercise: Aerobic:  Recumbent bike L2 x 8 min;  Supine: Seated:  Sit to stand, mat table x 10;   Standing:  Stretches:  Seated HS stretch 30 sec x 3 bil; Neuromuscular Re-education: L/R  weight shifts x 20; with trunk rotation x 10; With golf club, slow swing motion and trunk rotation x 20; Toe taps x 20 no UE support;  Fwd step ups x 10 bil, 1 (light) UE support;  Stairs up/down x 4 1 UE support. Side stepping 25 ft x 4;  Manual Therapy: Therapeutic Activity:    04/10/21: Therapeutic Exercise: Aerobic:  Recumbent bike L2 x 8 min;  Supine: Seated:  Sit to stand, mat table x 10;   Standing:  Stretches:  Seated HS stretch 30 sec x 3 bil; Neuromuscular Re-education: L/R and A/P weight shifts x 25 ea; tandem  stance (modified) 20 sec x 3 bil; Toe taps x 20;  Stairs up/down x 6. Manual Therapy: Therapeutic Activity:    04/08/21: Therapeutic Exercise: Aerobic:   Supine: Seated:  Sit to stand, mat table x 10;  LAQ 5 lb x 15 bil;  Standing: Marching x 15; HR x 20;  Stretches:  Seated HS stretch 30 sec x 3 bil; SKTC 30 sec x 2 bil;  Neuromuscular Re-education: L/R and A/P weight shifts x 25 ea; tandem stance (modified) 20 sec x 3 bil; Toe taps x 15; Step ups 6 in x 10 bil; Stairs up/down x 3, practice for step to pattern for knee pain.  Manual Therapy: Therapeutic Activity:    PATIENT EDUCATION:  Education details: updated and reviewed HEP.  Person educated: Patient Education method: Explanation, Demonstration, Tactile cues, Verbal cues, and Handouts Education comprehension: verbalized understanding, returned demonstration, verbal cues required, tactile cues required, and needs further education   HOME EXERCISE PROGRAM: Access Code: [email or web address] Updated 04/08/21   ASSESSMENT:  CLINICAL IMPRESSION: 04/15/21: Pt with good tolerance for activity today. Challenged with balance exercises, but no lob. Pt with  improving ability and stability with stairs and weight shifts. Reviewed Hep and continued strengthening.    OBJECTIVE IMPAIRMENTS Abnormal gait, decreased activity tolerance, decreased balance, decreased cognition, decreased endurance, decreased mobility, difficulty walking, decreased ROM, decreased strength, decreased safety awareness, increased muscle spasms, impaired flexibility, impaired sensation, improper body mechanics, obesity, and pain.   ACTIVITY LIMITATIONS cleaning, community activity, occupation, yard work, and shopping.   PERSONAL FACTORS Fitness are also affecting patient's functional outcome.    REHAB POTENTIAL: Good  CLINICAL DECISION MAKING: Stable/uncomplicated  EVALUATION COMPLEXITY: Low   GOALS: Goals reviewed with patient? Yes  SHORT TERM GOALS:  STG Name Target Date Goal status  1 Patient will be independent with initial HEP   04/17/21 INITIAL  2 Pt to demo improved ROM for  L - spine to be [facility] for flexion.  04/17/21 INITIAL         LONG TERM GOALS:   LTG Name Target Date Goal status  1 Patient will be independent with final HEP  05/29/21 INITIAL  2 Pt to report decreased pain in bil knees to 0-2/10   05/29/21 INITIAL  3 Pt to demonstrate improved strength of bil hips and knee to be at least 4+/5, to improve   05/29/21 INITIAL  4 Pt to demonstrate ability for standing/walking activity for at least 30 min without pain or weakness in LEs., to improve ability for community activity.  05/29/21 INITIAL  5 Pt to demo dynamic balance to be WNL for pt age, to improve safety and ability for functional activity   05/29/21 INITIAL     PLAN: PT FREQUENCY: 2x/week  PT DURATION: 8 weeks  PLANNED INTERVENTIONS: Therapeutic exercises, Therapeutic activity, Neuromuscular re-education, Balance training, Gait training, Patient/Family education, Joint manipulation, Joint mobilization, Stair training, DME instructions, Dry Needling, Electrical stimulation, Spinal  manipulation, Spinal mobilization, Cryotherapy, Moist heat, Taping, Vasopneumatic device, Traction, Ultrasound, Ionotophoresis '4mg'$ /ml Dexamethasone, and Manual therapy  PLAN FOR NEXT SESSION: LE strengthening, balance, weight shifts, bwd walking.   Lyndee Hensen, PT, DPT 4:49 PM  04/15/21

## 2021-04-17 ENCOUNTER — Encounter: Payer: Self-pay | Admitting: Physical Therapy

## 2021-04-17 ENCOUNTER — Ambulatory Visit (INDEPENDENT_AMBULATORY_CARE_PROVIDER_SITE_OTHER): Payer: Medicare Other | Admitting: Physical Therapy

## 2021-04-17 DIAGNOSIS — M25562 Pain in left knee: Secondary | ICD-10-CM | POA: Diagnosis not present

## 2021-04-17 DIAGNOSIS — M6281 Muscle weakness (generalized): Secondary | ICD-10-CM | POA: Diagnosis not present

## 2021-04-17 DIAGNOSIS — R2689 Other abnormalities of gait and mobility: Secondary | ICD-10-CM | POA: Diagnosis not present

## 2021-04-17 DIAGNOSIS — M25561 Pain in right knee: Secondary | ICD-10-CM | POA: Diagnosis not present

## 2021-04-17 NOTE — Therapy (Signed)
?OUTPATIENT PHYSICAL THERAPY TREATMENT ? ? ?Patient Name: Patrick Kidd ?MRN: [id number] ?DOB:1945/11/11, 76 y.o., male ?Today's Date: 04/17/2021 ? ? ? ? PT End of Session - 04/17/21 1112   ? ? Visit Number 5   ? Number of Visits 16   ? Date for PT Re-Evaluation 05/29/21   ? Authorization Type Medicare   ? PT Start Time 1106   ? PT Stop Time 1145   ? PT Time Calculation (min) 39 min   ? Equipment Utilized During Treatment Gait belt   ? Activity Tolerance Patient tolerated treatment well   ? Behavior During Therapy [facility] for tasks assessed/performed   ? ?  ?  ? ?  ? ? ? ? ? ?Past Medical History:  ?Diagnosis Date  ? Acrophobia   ? Alcoholic cirrhosis ([place]) 1/47/8295  ? Anemia   ? Arthritis   ? foot by big toe  ? BPH associated with nocturia   ? Cataract   ? removed both eyes  ? Chronic cough   ? PMH of  ? COVID-19 12/2018  ? Diabetes mellitus without complication ([place])   ? Diverticulosis 07-03-2010  ? Colonoscopy.   ? Duodenal ulcer 2017  ? Fallen arches   ? Bilateral  ? GERD (gastroesophageal reflux disease)   ? Gout   ? Granuloma annulare   ? Hx of adenomatous colonic polyps multiple  ? Hydrocele 2011  ? Large septated right hydrocele  ? Liver cyst   ? Liver lesion   ? Nonspecific elevation of levels of transaminase or lactic acid dehydrogenase (LDH)   ? Obesity   ? Peripheral neuropathy   ? Plantar fasciitis   ? PMH of  ? Portal hypertension ([place]) 2017  ? Prostate cancer ([place])   ? Right shoulder pain 11/2017  ? Sleep apnea   ? no cpap, patient denies  ? Thiamine deficiency   ? Wears reading eyeglasses   ? WPW (Wolff-Parkinson-White syndrome) 02/05/2019  ? ?Past Surgical History:  ?Procedure Laterality Date  ? CATARACT EXTRACTION, BILATERAL  12/2011   ? Dr Gershon Crane  ? COLONOSCOPY  2017  ? COLONOSCOPY W/ POLYPECTOMY  07/03/2010  ? 2 adenomas, diverticulosis on right. Dr [name]  ? CYSTOSCOPY N/A 03/15/2018  ? Procedure: CYSTOSCOPY FLEXIBLE;  Surgeon: [name], MD;  Location: [facility];   Service: Urology;  Laterality: N/A;  NO SEEDS FOUND IN BLADDER  ? ESOPHAGOGASTRODUODENOSCOPY (EGD) WITH PROPOFOL N/A 01/08/2016  ? Procedure: ESOPHAGOGASTRODUODENOSCOPY (EGD) WITH PROPOFOL;  Surgeon: [name], MD;  [facility];  Service: Endoscopy;  Laterality: N/A;  ? [place]  ? RADIOACTIVE SEED IMPLANT N/A 03/15/2018  ? Procedure: RADIOACTIVE SEED IMPLANT/BRACHYTHERAPY IMPLANT;  Surgeon: [name], MD;  Location: [facility];  Service: Urology;  Laterality: N/A;   73 SEEDS IMPLANTED  ? SIGMOIDOSCOPY    ? SPACE OAR INSTILLATION N/A 03/15/2018  ? Procedure: SPACE OAR INSTILLATION;  Surgeon: [name], MD;  Location[facility];  Service: Urology;  Laterality: N/A;  ? WISDOM TOOTH EXTRACTION    ? ?Patient Active Problem List  ? Diagnosis Date Noted  ? Degenerative arthritis of knee, bilateral 03/27/2021  ? Right leg pain 12/21/2020  ? Chronic lymphocytic leukemia ([place]) 11/08/2020  ? Atypical lymphocytes present on peripheral blood smear 09/26/2020  ? Insulin-requiring or dependent type II diabetes mellitus ([place]) 09/25/2020  ? Primary stabbing headache 03/15/2020  ? Stage 3a chronic kidney disease ([place]) 03/15/2020  ?  Nocturnal headaches 03/14/2020  ? Thiamine deficiency   ? Paroxysmal atrial fibrillation ([place]) 07/19/2019  ? Allergic rhinitis 06/09/2019  ? Erectile dysfunction due to diabetes mellitus ([place]) 06/09/2019  ? Overweight with body mass index (BMI) of 28 to 28.9 in adult 03/17/2019  ? WPW (Wolff-Parkinson-White syndrome) 02/05/2019  ? SBP (spontaneous bacterial peritonitis) (HCC)-December 2020,    ? Encephalopathy, hepatic   ? Degenerative disc disease, cervical 07/21/2018  ? Cervical radiculopathy 07/16/2018  ? OAB (overactive bladder) 07/06/2018  ? Malignant neoplasm of prostate ([place]) 01/13/2018  ? Hyperlipidemia LDL goal <100 11/11/2017  ? Essential hypertension 11/11/2017  ? BPH associated with nocturia 11/11/2017  ?  Erectile dysfunction due to arterial insufficiency 04/07/2017  ? Peripheral vascular disease ([place]) 06/25/2016  ? Alcoholic cirrhosis ([place]) 45/85/9292  ? Type 2 diabetes mellitus with complication, with long-term current use of insulin ([place]) 08/22/2015  ? Hypersomnolence 03/19/2014  ? Peripheral neuropathy 01/11/2013  ? Polyclonal gammopathy 01/11/2013  ? GERD 10/04/2009  ? ? ?PCP: [name], MD ? ?REFERRING PROVIDER: Charlann Boxer ? ?REFERRING DIAG: Bil Knee pain/OA, lumbar pain ? ?THERAPY DIAG:  ?Other abnormalities of gait and mobility ? ?Muscle weakness (generalized) ? ?Acute pain of left knee ? ?Acute pain of right knee ? ?ONSET DATE: 1-2 yrs ago ? ?SUBJECTIVE:  ? ?SUBJECTIVE STATEMENT: ?Pt states feeling pain in R posterior thigh when laying flat during the night, can change position and return to sleeping without pain. Feels LEs are getting stronger.  ? ? ?PERTINENT HISTORY: ? Chronic lymphocytic leukemia  ?Peripheral vascular disease   ?Alcoholic cirrhosis   ?Peripheral neuopathy ?DM ?OA ?  ? ? ?PAIN:  ?Are you having pain? Yes ?NPRS scale: 0-2/10 ?Pain location: Bil Knees.  ?Pain orientation: Right and Left  ?PAIN TYPE: aching ?Pain description: intermittent  ?Aggravating factors: Standing, activity, stairs. ?Relieving factors: none stated  ? ?PRECAUTIONS: None ? ?WEIGHT BEARING RESTRICTIONS No ? ?FALLS:  ?Has patient fallen in last 6 months? No, Number of falls: 0 ? ?OCCUPATION: Retired ? ?PLOF: Independent ? ?PATIENT GOALS  Play 9 holes of golf, no falls, be able to step up onto friends boat/yacht.  ? ? ?OBJECTIVE: ? ?COGNITION: ? Overall cognitive status: Within functional limits for tasks assessed   ?  ?SENSATION: ?Decreased sensation in feet, with some hypersensitivity in lower legs. ? ?MUSCLE LENGTH: ?HS: minimal limitation  ? ?PALPATION: Mild tenderness in popliteal region on R, mild tenderness at distal HS. ? ?ROM ?04/03/21:  Lumbar: mod limitation for flex and ext, Hips: mild limitation for flex  and rotation, Knees: WFL ? ?LE MMT: ?04/03/21:  ?Hips: 4-/5, Knees: 4/5,  ? ?LOWER EXTREMITY SPECIAL TESTS:  ? ? ? ?TODAY'S TREATMENT: ? ?04/17/21: ?Therapeutic Exercise: ?Aerobic:  Recumbent bike L2 x 7 min;  ?Supine: ?Seated:  Sit to stand, regular chair x 10;   ?Standing: Fwd step ups x 10 bil, 1 (light) UE support;  12 in step ups x 8 bil, UE support;  ?Stretches:  Seated HS stretch 30 sec x 3 bil; Neuromuscular Re-education: L/R  weight shifts trunk rotation x 15; With golf club, slow swing motion and trunk rotation x 20; A/P weight shifts x 20; On Air ex, x 20; Step ups onto AirEx x 10 bil;   Side stepping 25 ft x 6; UE tbands ext blue x 20 for balance; Rows Blue TB x 20 for posture:  ?Manual Therapy: ?Therapeutic Activity: ? ? ?04/15/21: ?Therapeutic Exercise: ?Aerobic:  Recumbent bike L2 x 8 min;  ?Supine: ?  Seated:  Sit to stand, mat table x 10;   ?Standing:  ?Stretches:  Seated HS stretch 30 sec x 3 bil; Neuromuscular Re-education: L/R  weight shifts x 20; with trunk rotation x 10; With golf club, slow swing motion and trunk rotation x 20; Toe taps x 20 no UE support;  Fwd step ups x 10 bil, 1 (light) UE support;  Stairs up/down x 4 1 UE support. Side stepping 25 ft x 4;  ?Manual Therapy: ?Therapeutic Activity: ? ? ?  ? ?PATIENT EDUCATION:  ?Education details:  reviewed HEP.  ?Person educated: Patient ?Education method: Explanation, Demonstration, Tactile cues, Verbal cues, and Handouts ?Education comprehension: verbalized understanding, returned demonstration, verbal cues required, tactile cues required, and needs further education ? ? ?HOME EXERCISE PROGRAM: ?Access Code: [email or web address] ?Updated 04/08/21 ? ? ?ASSESSMENT: ? ?CLINICAL IMPRESSION: ?04/17/21: Pt with improved sway ROM for weight shifts in all directions, able to progress to weight shifts on AirEx today. Pt with improving ability for step ups with L LE, limited due to weakness and instability. Pt to benefit from continued strengthening and  balance.   ? ? ?OBJECTIVE IMPAIRMENTS Abnormal gait, decreased activity tolerance, decreased balance, decreased cognition, decreased endurance, decreased mobility, difficulty walking, decreased ROM, decreased strength, decreased

## 2021-04-22 ENCOUNTER — Encounter: Payer: Self-pay | Admitting: Physical Therapy

## 2021-04-22 ENCOUNTER — Ambulatory Visit (INDEPENDENT_AMBULATORY_CARE_PROVIDER_SITE_OTHER): Payer: Medicare Other | Admitting: Physical Therapy

## 2021-04-22 DIAGNOSIS — M6281 Muscle weakness (generalized): Secondary | ICD-10-CM | POA: Diagnosis not present

## 2021-04-22 DIAGNOSIS — R2689 Other abnormalities of gait and mobility: Secondary | ICD-10-CM

## 2021-04-22 DIAGNOSIS — M25562 Pain in left knee: Secondary | ICD-10-CM

## 2021-04-22 DIAGNOSIS — M25561 Pain in right knee: Secondary | ICD-10-CM | POA: Diagnosis not present

## 2021-04-22 NOTE — Therapy (Signed)
?OUTPATIENT PHYSICAL THERAPY TREATMENT ? ? ?Patient Name: Patrick Kidd ?MRN: [id number] ?DOB:07/12/1945, 76 y.o., male ?Today's Date: 04/22/2021 ? ? ? ? PT End of Session - 04/22/21 1610   ? ? Visit Number 6   ? Number of Visits 16   ? Date for PT Re-Evaluation 05/29/21   ? Authorization Type Medicare   ? PT Start Time 7494   ? PT Stop Time 4967   ? PT Time Calculation (min) 41 min   ? Equipment Utilized During Treatment Gait belt   ? Activity Tolerance Patient tolerated treatment well   ? Behavior During Therapy [facility] for tasks assessed/performed   ? ?  ?  ? ?  ? ? ? ? ? ? ?Past Medical History:  ?Diagnosis Date  ? Acrophobia   ? Alcoholic cirrhosis ([place]) 5/91/6384  ? Anemia   ? Arthritis   ? foot by big toe  ? BPH associated with nocturia   ? Cataract   ? removed both eyes  ? Chronic cough   ? PMH of  ? COVID-19 12/2018  ? Diabetes mellitus without complication ([place])   ? Diverticulosis 07-03-2010  ? Colonoscopy.   ? Duodenal ulcer 2017  ? Fallen arches   ? Bilateral  ? GERD (gastroesophageal reflux disease)   ? Gout   ? Granuloma annulare   ? Hx of adenomatous colonic polyps multiple  ? Hydrocele 2011  ? Large septated right hydrocele  ? Liver cyst   ? Liver lesion   ? Nonspecific elevation of levels of transaminase or lactic acid dehydrogenase (LDH)   ? Obesity   ? Peripheral neuropathy   ? Plantar fasciitis   ? PMH of  ? Portal hypertension ([place]) 2017  ? Prostate cancer ([place])   ? Right shoulder pain 11/2017  ? Sleep apnea   ? no cpap, patient denies  ? Thiamine deficiency   ? Wears reading eyeglasses   ? WPW (Wolff-Parkinson-White syndrome) 02/05/2019  ? ?Past Surgical History:  ?Procedure Laterality Date  ? CATARACT EXTRACTION, BILATERAL  12/2011   ? Dr Gershon Crane  ? COLONOSCOPY  2017  ? COLONOSCOPY W/ POLYPECTOMY  07/03/2010  ? 2 adenomas, diverticulosis on right. Dr [name]  ? CYSTOSCOPY N/A 03/15/2018  ? Procedure: CYSTOSCOPY FLEXIBLE;  Surgeon: [name], MD;  Location: [facility];   Service: Urology;  Laterality: N/A;  NO SEEDS FOUND IN BLADDER  ? ESOPHAGOGASTRODUODENOSCOPY (EGD) WITH PROPOFOL N/A 01/08/2016  ? Procedure: ESOPHAGOGASTRODUODENOSCOPY (EGD) WITH PROPOFOL;  Surgeon: [name], MD;  Location[facility];  Service: Endoscopy;  Laterality: N/A;  ? [place]  ? RADIOACTIVE SEED IMPLANT N/A 03/15/2018  ? Procedure: RADIOACTIVE SEED IMPLANT/BRACHYTHERAPY IMPLANT;  Surgeon: [name], MD;  Location: [facility];  Service: Urology;  Laterality: N/A;   73 SEEDS IMPLANTED  ? SIGMOIDOSCOPY    ? SPACE OAR INSTILLATION N/A 03/15/2018  ? Procedure: SPACE OAR INSTILLATION;  Surgeon: [name], MD;  Location: [facility];  Service: Urology;  Laterality: N/A;  ? WISDOM TOOTH EXTRACTION    ? ?Patient Active Problem List  ? Diagnosis Date Noted  ? Degenerative arthritis of knee, bilateral 03/27/2021  ? Right leg pain 12/21/2020  ? Chronic lymphocytic leukemia ([place]) 11/08/2020  ? Atypical lymphocytes present on peripheral blood smear 09/26/2020  ? Insulin-requiring or dependent type II diabetes mellitus ([place]) 09/25/2020  ? Primary stabbing headache 03/15/2020  ? Stage 3a chronic kidney disease ([place])  03/15/2020  ? Nocturnal headaches 03/14/2020  ? Thiamine deficiency   ? Paroxysmal atrial fibrillation ([place]) 07/19/2019  ? Allergic rhinitis 06/09/2019  ? Erectile dysfunction due to diabetes mellitus ([place]) 06/09/2019  ? Overweight with body mass index (BMI) of 28 to 28.9 in adult 03/17/2019  ? WPW (Wolff-Parkinson-White syndrome) 02/05/2019  ? SBP (spontaneous bacterial peritonitis) (HCC)-December 2020,    ? Encephalopathy, hepatic   ? Degenerative disc disease, cervical 07/21/2018  ? Cervical radiculopathy 07/16/2018  ? OAB (overactive bladder) 07/06/2018  ? Malignant neoplasm of prostate ([place]) 01/13/2018  ? Hyperlipidemia LDL goal <100 11/11/2017  ? Essential hypertension 11/11/2017  ? BPH associated with nocturia 11/11/2017  ?  Erectile dysfunction due to arterial insufficiency 04/07/2017  ? Peripheral vascular disease ([place]) 06/25/2016  ? Alcoholic cirrhosis ([place]) 18/84/1660  ? Type 2 diabetes mellitus with complication, with long-term current use of insulin ([place]) 08/22/2015  ? Hypersomnolence 03/19/2014  ? Peripheral neuropathy 01/11/2013  ? Polyclonal gammopathy 01/11/2013  ? GERD 10/04/2009  ? ? ?PCP: [name], MD ? ?REFERRING PROVIDER: Charlann Boxer ? ?REFERRING DIAG: Bil Knee pain/OA, lumbar pain ? ?THERAPY DIAG:  ?Other abnormalities of gait and mobility ? ?Muscle weakness (generalized) ? ?Acute pain of left knee ? ?Acute pain of right knee ? ?ONSET DATE: 1-2 yrs ago ? ?SUBJECTIVE:  ? ?SUBJECTIVE STATEMENT: ?Pt with no new complaints.  ? ? ?PERTINENT HISTORY: ? Chronic lymphocytic leukemia  ?Peripheral vascular disease   ?Alcoholic cirrhosis   ?Peripheral neuopathy ?DM ?OA ?  ? ? ?PAIN:  ?Are you having pain? Yes ?NPRS scale: 0-2/10 ?Pain location: Bil Knees.  ?Pain orientation: Right and Left  ?PAIN TYPE: aching ?Pain description: intermittent  ?Aggravating factors: Standing, activity, stairs. ?Relieving factors: none stated  ? ?PRECAUTIONS: None ? ?WEIGHT BEARING RESTRICTIONS No ? ?FALLS:  ?Has patient fallen in last 6 months? No, Number of falls: 0 ? ?OCCUPATION: Retired ? ?PLOF: Independent ? ?PATIENT GOALS  Play 9 holes of golf, no falls, be able to step up onto friends boat/yacht.  ? ? ?OBJECTIVE: ? ?COGNITION: ? Overall cognitive status: Within functional limits for tasks assessed   ?  ?SENSATION: ?Decreased sensation in feet, with some hypersensitivity in lower legs. ? ?MUSCLE LENGTH: ?HS: minimal limitation  ? ?PALPATION: Mild tenderness in popliteal region on R, mild tenderness at distal HS. ? ?ROM ?04/03/21:  Lumbar: mod limitation for flex and ext, Hips: mild limitation for flex and rotation, Knees: WFL ? ?LE MMT: ?04/03/21:  ?Hips: 4-/5, Knees: 4/5,  ? ?LOWER EXTREMITY SPECIAL TESTS:  ? ? ? ?TODAY'S  TREATMENT: ? ?04/22/21: ?Therapeutic Exercise: ?Aerobic:  Recumbent bike L2 x 8 min;  ?Supine:  ?Seated:  Sit to stand, regular chair x 10;   ?Standing: Fwd step ups x 10 bil, 1 (light) UE support;  Stretches:  Seated HS stretch 30 sec x 3 bil;  ? ?Neuromuscular Re-education: AirEx: A/P weight shifts x 20; L/R  weight shifts trunk rotation x 15;  ?Golf swing-moderate swing speed and moderate trunk rotation 2 x 5;  Side stepping 25 ft x 6; UE tbands ext blue x 20 for balance; Rows Blue TB x 20 for posture; Lateral lunge weight shifts x 15 bil with UE (4lb) reach;  UE rotation with UE reach (4lb) x 15 bil;  ?Manual Therapy: ?Therapeutic Activity: ? ? ? ?04/17/21: ?Therapeutic Exercise: ?Aerobic:  Recumbent bike L2 x 7 min;  ?Supine: ?Seated:  Sit to stand, regular chair x 10;   ?Standing: Fwd step ups x  10 bil, 1 (light) UE support;  12 in step ups x 8 bil, UE support;  ?Stretches:  Seated HS stretch 30 sec x 3 bil; Neuromuscular Re-education: L/R  weight shifts trunk rotation x 15; With golf club, slow swing motion and trunk rotation x 20; A/P weight shifts x 20; On Air ex, x 20; Step ups onto AirEx x 10 bil;   Side stepping 25 ft x 6; UE tbands ext blue x 20 for balance; Rows Blue TB x 20 for posture:  ?Manual Therapy: ?Therapeutic Activity: ? ? ?04/15/21: ?Therapeutic Exercise: ?Aerobic:  Recumbent bike L2 x 8 min;  ?Supine: ?Seated:  Sit to stand, mat table x 10;   ?Standing:  ?Stretches:  Seated HS stretch 30 sec x 3 bil; Neuromuscular Re-education: L/R  weight shifts x 20; with trunk rotation x 10; With golf club, slow swing motion and trunk rotation x 20; Toe taps x 20 no UE support;  Fwd step ups x 10 bil, 1 (light) UE support;  Stairs up/down x 4 1 UE support. Side stepping 25 ft x 4;  ?Manual Therapy: ?Therapeutic Activity: ? ? ?  ? ?PATIENT EDUCATION:  ?Education details:  reviewed HEP.  ?Person educated: Patient ?Education method: Explanation, Demonstration, Tactile cues, Verbal cues, and Handouts ?Education  comprehension: verbalized understanding, returned demonstration, verbal cues required, tactile cues required, and needs further education ? ? ?HOME EXERCISE PROGRAM: ?Access Code: [email or web address] ?Updated 04/08/21 ? ? ?ASSESSMENT: ? ?CLINI

## 2021-04-24 ENCOUNTER — Encounter: Payer: Self-pay | Admitting: Physical Therapy

## 2021-04-24 ENCOUNTER — Ambulatory Visit (INDEPENDENT_AMBULATORY_CARE_PROVIDER_SITE_OTHER): Payer: Medicare Other | Admitting: Physical Therapy

## 2021-04-24 DIAGNOSIS — M25561 Pain in right knee: Secondary | ICD-10-CM | POA: Diagnosis not present

## 2021-04-24 DIAGNOSIS — M6281 Muscle weakness (generalized): Secondary | ICD-10-CM

## 2021-04-24 DIAGNOSIS — R2689 Other abnormalities of gait and mobility: Secondary | ICD-10-CM

## 2021-04-24 DIAGNOSIS — M25562 Pain in left knee: Secondary | ICD-10-CM

## 2021-04-24 NOTE — Therapy (Signed)
?OUTPATIENT PHYSICAL THERAPY TREATMENT ? ? ?Patient Name: Patrick Kidd ?MRN: [id number] ?DOB:Jul 04, 1945, 76 y.o., male ?Today's Date: 04/24/2021 ? ? ? ? PT End of Session - 04/24/21 1203   ? ? Visit Number 7   ? Number of Visits 16   ? Date for PT Re-Evaluation 05/29/21   ? Authorization Type Medicare   ? PT Start Time 1106   ? PT Stop Time 1145   ? PT Time Calculation (min) 39 min   ? Equipment Utilized During Treatment Gait belt   ? Activity Tolerance Patient tolerated treatment well   ? Behavior During Therapy [facility] for tasks assessed/performed   ? ?  ?  ? ?  ? ? ? ? ? ? ?Past Medical History:  ?Diagnosis Date  ? Acrophobia   ? Alcoholic cirrhosis ([place]) 09/16/4816  ? Anemia   ? Arthritis   ? foot by big toe  ? BPH associated with nocturia   ? Cataract   ? removed both eyes  ? Chronic cough   ? PMH of  ? COVID-19 12/2018  ? Diabetes mellitus without complication ([place])   ? Diverticulosis 07-03-2010  ? Colonoscopy.   ? Duodenal ulcer 2017  ? Fallen arches   ? Bilateral  ? GERD (gastroesophageal reflux disease)   ? Gout   ? Granuloma annulare   ? Hx of adenomatous colonic polyps multiple  ? Hydrocele 2011  ? Large septated right hydrocele  ? Liver cyst   ? Liver lesion   ? Nonspecific elevation of levels of transaminase or lactic acid dehydrogenase (LDH)   ? Obesity   ? Peripheral neuropathy   ? Plantar fasciitis   ? PMH of  ? Portal hypertension ([place]) 2017  ? Prostate cancer ([place])   ? Right shoulder pain 11/2017  ? Sleep apnea   ? no cpap, patient denies  ? Thiamine deficiency   ? Wears reading eyeglasses   ? WPW (Wolff-Parkinson-White syndrome) 02/05/2019  ? ?Past Surgical History:  ?Procedure Laterality Date  ? CATARACT EXTRACTION, BILATERAL  12/2011   ? Dr Gershon Crane  ? COLONOSCOPY  2017  ? COLONOSCOPY W/ POLYPECTOMY  07/03/2010  ? 2 adenomas, diverticulosis on right. Dr [name]  ? CYSTOSCOPY N/A 03/15/2018  ? Procedure: CYSTOSCOPY FLEXIBLE;  Surgeon: [name], MD;  Location: [name];   Service: Urology;  Laterality: N/A;  NO SEEDS FOUND IN BLADDER  ? ESOPHAGOGASTRODUODENOSCOPY (EGD) WITH PROPOFOL N/A 01/08/2016  ? Procedure: ESOPHAGOGASTRODUODENOSCOPY (EGD) WITH PROPOFOL;  Surgeon: [name], MD;  [facility];  Service: Endoscopy;  Laterality: N/A;  ? [employer] [facility]  ? RADIOACTIVE SEED IMPLANT N/A 03/15/2018  ? Procedure: RADIOACTIVE SEED IMPLANT/BRACHYTHERAPY IMPLANT;  Surgeon: [name], MD;  Location: [facility];  Service: Urology;  Laterality: N/A;   73 SEEDS IMPLANTED  ? SIGMOIDOSCOPY    ? SPACE OAR INSTILLATION N/A 03/15/2018  ? Procedure: SPACE OAR INSTILLATION;  Surgeon: [name], MD;  [facility];  Service: Urology;  Laterality: N/A;  ? WISDOM TOOTH EXTRACTION    ? ?Patient Active Problem List  ? Diagnosis Date Noted  ? Degenerative arthritis of knee, bilateral 03/27/2021  ? Right leg pain 12/21/2020  ? Chronic lymphocytic leukemia ([place]) 11/08/2020  ? Atypical lymphocytes present on peripheral blood smear 09/26/2020  ? Insulin-requiring or dependent type II diabetes mellitus ([place]) 09/25/2020  ? Primary stabbing headache 03/15/2020  ? Stage 3a chronic kidney disease ([place])  03/15/2020  ? Nocturnal headaches 03/14/2020  ? Thiamine deficiency   ? Paroxysmal atrial fibrillation ([place]) 07/19/2019  ? Allergic rhinitis 06/09/2019  ? Erectile dysfunction due to diabetes mellitus ([place]) 06/09/2019  ? Overweight with body mass index (BMI) of 28 to 28.9 in adult 03/17/2019  ? WPW (Wolff-Parkinson-White syndrome) 02/05/2019  ? SBP (spontaneous bacterial peritonitis) (HCC)-December 2020,    ? Encephalopathy, hepatic   ? Degenerative disc disease, cervical 07/21/2018  ? Cervical radiculopathy 07/16/2018  ? OAB (overactive bladder) 07/06/2018  ? Malignant neoplasm of prostate ([place]) 01/13/2018  ? Hyperlipidemia LDL goal <100 11/11/2017  ? Essential hypertension 11/11/2017  ? BPH associated with nocturia 11/11/2017  ?  Erectile dysfunction due to arterial insufficiency 04/07/2017  ? Peripheral vascular disease ([place]) 06/25/2016  ? Alcoholic cirrhosis ([place]) 47/10/6281  ? Type 2 diabetes mellitus with complication, with long-term current use of insulin ([place]) 08/22/2015  ? Hypersomnolence 03/19/2014  ? Peripheral neuropathy 01/11/2013  ? Polyclonal gammopathy 01/11/2013  ? GERD 10/04/2009  ? ? ?PCP: [name], MD ? ?REFERRING PROVIDER: Charlann Boxer ? ?REFERRING DIAG: Bil Knee pain/OA, lumbar pain ? ?THERAPY DIAG:  ?Other abnormalities of gait and mobility ? ?Muscle weakness (generalized) ? ?Acute pain of left knee ? ?Acute pain of right knee ? ?ONSET DATE: 1-2 yrs ago ? ?SUBJECTIVE:  ? ?SUBJECTIVE STATEMENT: ?Pt states more frequent pain in LE with laying flat on back.   ? ? ?PERTINENT HISTORY: ? Chronic lymphocytic leukemia  ?Peripheral vascular disease   ?Alcoholic cirrhosis   ?Peripheral neuopathy ?DM ?OA ?  ? ? ?PAIN:  ?Are you having pain? Yes ?NPRS scale: 0-2/10 ?Pain location: Bil Knees.  ?Pain orientation: Right and Left  ?PAIN TYPE: aching ?Pain description: intermittent  ?Aggravating factors: Standing, activity, stairs. ?Relieving factors: none stated  ? ?PRECAUTIONS: None ? ?WEIGHT BEARING RESTRICTIONS No ? ?FALLS:  ?Has patient fallen in last 6 months? No, Number of falls: 0 ? ?OCCUPATION: Retired ? ?PLOF: Independent ? ?PATIENT GOALS  Play 9 holes of golf, no falls, be able to step up onto friends boat/yacht.  ? ? ?OBJECTIVE: ? ?COGNITION: ? Overall cognitive status: Within functional limits for tasks assessed   ?  ?SENSATION: ?Decreased sensation in feet, with some hypersensitivity in lower legs. ? ?MUSCLE LENGTH: ?HS: minimal limitation  ? ?PALPATION: Mild tenderness in popliteal region on R, mild tenderness at distal HS. ? ?ROM ?04/03/21:  Lumbar: mod limitation for flex and ext, Hips: mild limitation for flex and rotation, Knees: WFL ? ?LE MMT: ?04/03/21:  ?Hips: 4-/5, Knees: 4/5,  ? ?LOWER EXTREMITY SPECIAL TESTS:   ? ? ? ?TODAY'S TREATMENT: ? ?04/24/21: ?Therapeutic Exercise: ?Aerobic:  Recumbent bike L2 x 8 min;  ?Supine: Pelvic tilts x20;  ?Seated:     ?Standing: Fwd step ups x 10 bil, 6 in:1 (light) UE support;  12 in x 3 bil;  Hip ext x 10 bil with slight lumbar flexion at counter; Toe taps 6 in step x20 no UE support; Walk/March 10 ft x 4 minimal UE support;  ?Stretches:  Seated HS stretch 30 sec x 3 bil; SKTC 30 sec x 3 bil;  ?Neuromuscular Re-education:  ?Manual Therapy: PA hip mobs, Post hip mobs, Manual hip flexor stretching (prone), Lumbar PA mobs, SI mobs on R; Long leg distraction on R for lumbar pump  ? ? ?04/17/21: ?Therapeutic Exercise: ?Aerobic:  Recumbent bike L2 x 7 min;  ?Supine: ?Seated:  Sit to stand, regular chair x 10;   ?Standing: Fwd step ups  x 10 bil, 1 (light) UE support;  12 in step ups x 8 bil, UE support;  ?Stretches:  Seated HS stretch 30 sec x 3 bil; Neuromuscular Re-education: L/R  weight shifts trunk rotation x 15; With golf club, slow swing motion and trunk rotation x 20; A/P weight shifts x 20; On Air ex, x 20; Step ups onto AirEx x 10 bil;   Side stepping 25 ft x 6; UE tbands ext blue x 20 for balance; Rows Blue TB x 20 for posture:  ?Manual Therapy: ?Therapeutic Activity: ? ? ?PATIENT EDUCATION:  ?Education details:  reviewed HEP.  ?Person educated: Patient ?Education method: Explanation, Demonstration, Tactile cues, Verbal cues, and Handouts ?Education comprehension: verbalized understanding, returned demonstration, verbal cues required, tactile cues required, and needs further education ? ? ?HOME EXERCISE PROGRAM: ?Access Code: [email or web address] ?Updated 04/08/21 ? ? ?ASSESSMENT: ? ?CLINICAL IMPRESSION: ?04/24/21:  ?Further testing for pain in R SI/LE, reproducible with even mild lumbar or hip extension, decreased with flexion immediately. Discussed continuing lumbar flexion bias ther ex throughout day. Also discussed hip/knee flexion positioning for sleeping on back. Pt with improving balance and  ability with NMR exercises and step ups. Pt to benefit from progressive LE strength, balance, and for decompression and R radicular pain.  ? ? ?OBJECTIVE IMPAIRMENTS Abnormal gait, decreased activity tolerance, de

## 2021-04-25 ENCOUNTER — Other Ambulatory Visit: Payer: Self-pay | Admitting: Internal Medicine

## 2021-04-25 DIAGNOSIS — K219 Gastro-esophageal reflux disease without esophagitis: Secondary | ICD-10-CM

## 2021-04-25 DIAGNOSIS — R053 Chronic cough: Secondary | ICD-10-CM

## 2021-04-29 ENCOUNTER — Encounter: Payer: Self-pay | Admitting: Physical Therapy

## 2021-04-29 ENCOUNTER — Ambulatory Visit (INDEPENDENT_AMBULATORY_CARE_PROVIDER_SITE_OTHER): Payer: Medicare Other | Admitting: Physical Therapy

## 2021-04-29 DIAGNOSIS — R2689 Other abnormalities of gait and mobility: Secondary | ICD-10-CM

## 2021-04-29 DIAGNOSIS — M6281 Muscle weakness (generalized): Secondary | ICD-10-CM

## 2021-04-29 DIAGNOSIS — M25562 Pain in left knee: Secondary | ICD-10-CM | POA: Diagnosis not present

## 2021-04-29 DIAGNOSIS — M25561 Pain in right knee: Secondary | ICD-10-CM | POA: Diagnosis not present

## 2021-04-29 NOTE — Progress Notes (Signed)
?Charlann Boxer D.O. ?[facility] ?[place] ?Phone: [phone] ?Subjective:   ?I, Patrick Kidd, am serving as a scribe for Dr. Hulan Saas. ? ?This visit occurred during the SARS-CoV-2 public health emergency.  Safety protocols were in place, including screening questions prior to the visit, additional usage of staff PPE, and extensive cleaning of exam room while observing appropriate contact time as indicated for disinfecting solutions.  ? ? ?I'm seeing this patient by the request  of:  [name], MD ? ?CC: bilateral knee pain  ? ?FFM:[id number]  ?03/27/2021 ?Bilateral injections given today, will get x-rays to further evaluate.  Patient should monitor relatively well.  Could be a candidate for viscosupplementation and we will try to get approval for this as well.  Discussed icing regimen and home exercises, increase activity slowly.  Follow-up again in 6 weeks ? ?Patient does have some peripheral neuropathy at this point.  Patient has had difficulty but we will start him in formal physical therapy and I do think that it will improve.  Patient has responded well to gabapentin previously but concerned to increase the amount anymore at this moment.  Discussed with patient about that there is a possibility for some lumbar neuropathy as well.  Patient does not have pain in the back but still with patient's past medical history significant for malignant prostate cancer as well as CLL I do want to get further imaging with MRI of the back and pelvis.  Depending on this could be a candidate for possible epidurals.  Follow-up with me again in 4 to 6 weeks. ? ?Updated 04/30/2021 ?Patrick Kidd is a 76 y.o. male coming in with complaint of bilateral knee pain. Patient states that physical therapy has helped. L knee is still weak. Not having any pain in knees after steroid injections.  ? ?Lumbar spine pain is worse when lying on his back. Thinks he would like epidural  injetion.  ? ?MRI IMPRESSION: ?Motion degraded study ?  ?Negative for fracture or metastatic disease ?  ?Lumbar spondylosis. Multilevel spinal and foraminal stenosis as ?detailed above. ? ?IMPRESSION: ?1. No hip fracture, dislocation or avascular necrosis. ?2. Mild osteoarthritis of bilateral hips. ?3. Mild osteoarthritis of bilateral SI joints. ?4. At L4-5 there is minimal retrolisthesis of L4 on L5. Mild ?broad-based disc bulge. Moderate bilateral facet arthropathy with ?ligamentum flavum infolding. Bilateral subarticular recess stenosis, ?left worse than right. Moderate spinal stenosis. ?5. At L5-S1 there is a mild broad-based disc bulge. Severe bilateral ?facet arthropathy with bilateral facet effusions and ligamentum ?flavum infolding. Bilateral subarticular recess stenosis. ? ?  ? ?Past Medical History:  ?Diagnosis Date  ? Acrophobia   ? Alcoholic cirrhosis ([place]) 08/04/7791  ? Anemia   ? Arthritis   ? foot by big toe  ? BPH associated with nocturia   ? Cataract   ? removed both eyes  ? Chronic cough   ? PMH of  ? COVID-19 12/2018  ? Diabetes mellitus without complication ([place])   ? Diverticulosis 07-03-2010  ? Colonoscopy.   ? Duodenal ulcer 2017  ? Fallen arches   ? Bilateral  ? GERD (gastroesophageal reflux disease)   ? Gout   ? Granuloma annulare   ? Hx of adenomatous colonic polyps multiple  ? Hydrocele 2011  ? Large septated right hydrocele  ? Liver cyst   ? Liver lesion   ? Nonspecific elevation of levels of transaminase or lactic acid dehydrogenase (LDH)   ? Obesity   ?  Peripheral neuropathy   ? Plantar fasciitis   ? PMH of  ? Portal hypertension ([place]) 2017  ? Prostate cancer ([place])   ? Right shoulder pain 11/2017  ? Sleep apnea   ? no cpap, patient denies  ? Thiamine deficiency   ? Wears reading eyeglasses   ? WPW (Wolff-Parkinson-White syndrome) 02/05/2019  ? ?Past Surgical History:  ?Procedure Laterality Date  ? CATARACT EXTRACTION, BILATERAL  12/2011   ? Dr Gershon Crane  ? COLONOSCOPY  2017  ? COLONOSCOPY W/  POLYPECTOMY  07/03/2010  ? 2 adenomas, diverticulosis on right. Dr [name]  ? CYSTOSCOPY N/A 03/15/2018  ? Procedure: CYSTOSCOPY FLEXIBLE;  Surgeon: [name], MD;  Location: [facility];  Service: Urology;  Laterality: N/A;  NO SEEDS FOUND IN BLADDER  ? ESOPHAGOGASTRODUODENOSCOPY (EGD) WITH PROPOFOL N/A 01/08/2016  ? Procedure: ESOPHAGOGASTRODUODENOSCOPY (EGD) WITH PROPOFOL;  Surgeon: [name], MD;  Location: [facility];  Service: Endoscopy;  Laterality: N/A;  ? [place]  ? RADIOACTIVE SEED IMPLANT N/A 03/15/2018  ? Procedure: RADIOACTIVE SEED IMPLANT/BRACHYTHERAPY IMPLANT;  Surgeon: [name], MD;  Location: [facility];  Service: Urology;  Laterality: N/A;   73 SEEDS IMPLANTED  ? SIGMOIDOSCOPY    ? SPACE OAR INSTILLATION N/A 03/15/2018  ? Procedure: SPACE OAR INSTILLATION;  Surgeon: [name], MD;  Location: [facility];  Service: Urology;  Laterality: N/A;  ? WISDOM TOOTH EXTRACTION    ? ?Social History  ? ?Socioeconomic History  ? Marital status: Married  ?  Spouse name: [name]  ? Number of children: 2  ? Years of education: Not on file  ? Highest education level: Some college, no degree  ?Occupational History  ? Occupation: Adult nurse estate  ?Tobacco Use  ? Smoking status: Former  ?  Packs/day: 2.00  ?  Years: 6.00  ?  Pack years: 12.00  ?  Types: Cigarettes  ?  Quit date: 02/04/1971  ?  Years since quitting: 50.2  ? Smokeless tobacco: Never  ? Tobacco comments:  ?  smoked age [age], up to 2 ppd  ?Vaping Use  ? Vaping Use: Never used  ?Substance and Sexual Activity  ? Alcohol use: Not Currently  ?  Alcohol/week: 28.0 standard drinks  ?  Types: 28 Shots of liquor per week  ?  Comment: No alochol since Christmas Day 2020  ? Drug use: No  ? Sexual activity: Yes  ?  Partners: Female  ?Other Topics Concern  ? Not on file  ?Social History Narrative  ? Worked in Adult nurse estate  ? 2 children  ? Former smoker no  drug use  ? Alcoholism, abstinent since 01/23/2019 most recently  ? Fun/Hobby: Play golf, grandchildren, [employer]  ?   ? Patient is left-handed. He lives with his wife in a one level home. He swims most days.  ? ?Social Determinants of Health  ? ?Financial Resource Strain: Not on file  ?Food Insecurity: Not on file  ?Transportation Needs: Not on file  ?Physical Activity: Not on file  ?Stress: Not on file  ?Social Connections: Not on file  ? ?Allergies  ?Allergen Reactions  ? Rifaximin Nausea And Vomiting  ?  Required EMS visit, although patient did not go to hospital  ? Beta Adrenergic Blockers   ?  Likely WPW.  Use with caution  ? Calcium Channel Blockers   ?  Likely WPW.  Use with caution.  ? ?Family History  ?  Problem Relation Age of Onset  ? Lung cancer Mother   ?     smoker  ? Leukemia Father   ?     Acute myelocytic  ? Diabetes Neg Hx   ? Stroke Neg Hx   ? Heart disease Neg Hx   ? Colon cancer Neg Hx   ? Colon polyps Neg Hx   ? Esophageal cancer Neg Hx   ? Rectal cancer Neg Hx   ? Stomach cancer Neg Hx   ? ? ?Current Outpatient Medications (Endocrine & Metabolic):  ?  FARXIGA 10 MG TABS tablet, Take 1 tablet (10 mg total) by mouth daily before breakfast. ?  insulin glargine, 2 Unit Dial, (TOUJEO MAX SOLOSTAR) 300 UNIT/ML Solostar Pen, Inject 30 Units into the skin daily. Via [facility] pt assistance (Patient taking differently: Inject 50 Units into the skin daily. Via [facility] pt assistance) ?  metFORMIN (GLUCOPHAGE) 500 MG tablet, TAKE 1 TABLET BY MOUTH 2 TIMES DAILY WITH A MEAL. ? ?Current Outpatient Medications (Cardiovascular):  ?  tadalafil (CIALIS) 20 MG tablet, Take 1 tablet (20 mg total) by mouth daily as needed for erectile dysfunction. ? ? ?Current Outpatient Medications (Analgesics):  ?  Colchicine (MITIGARE) 0.6 MG CAPS, Take 1 tablet by mouth daily. ? ?Current Outpatient Medications (Hematological):  ?  folic acid (FOLVITE) 1 MG tablet, TAKE 1 TABLET BY MOUTH EVERY DAY ?  vitamin B-12 (CYANOCOBALAMIN) 1000  MCG tablet, Take 1,000 mcg by mouth daily. ? ?Current Outpatient Medications (Other):  ?  blood glucose meter kit and supplies KIT, Use to test blood sugar once daily. DX E11.8 ?  gabapentin (NEURONTIN) 300 MG

## 2021-04-29 NOTE — Therapy (Signed)
?OUTPATIENT PHYSICAL THERAPY TREATMENT ? ? ?Patient Name: Patrick Kidd ?MRN: [id number] ?DOB:Aug 12, 1945, 76 y.o., male ?Today's Date: 04/29/2021 ? ? ? ? PT End of Session - 04/29/21 1608   ? ? Visit Number 8   ? Number of Visits 16   ? Date for PT Re-Evaluation 05/29/21   ? Authorization Type Medicare   ? PT Start Time 3762   ? PT Stop Time 8315   ? PT Time Calculation (min) 40 min   ? Equipment Utilized During Treatment Gait belt   ? Activity Tolerance Patient tolerated treatment well   ? Behavior During Therapy [facility] for tasks assessed/performed   ? ?  ?  ? ?  ? ? ? ? ? ? ? ?Past Medical History:  ?Diagnosis Date  ? Acrophobia   ? Alcoholic cirrhosis ([place]) 1/76/1607  ? Anemia   ? Arthritis   ? foot by big toe  ? BPH associated with nocturia   ? Cataract   ? removed both eyes  ? Chronic cough   ? PMH of  ? COVID-19 12/2018  ? Diabetes mellitus without complication ([place])   ? Diverticulosis 07-03-2010  ? Colonoscopy.   ? Duodenal ulcer 2017  ? Fallen arches   ? Bilateral  ? GERD (gastroesophageal reflux disease)   ? Gout   ? Granuloma annulare   ? Hx of adenomatous colonic polyps multiple  ? Hydrocele 2011  ? Large septated right hydrocele  ? Liver cyst   ? Liver lesion   ? Nonspecific elevation of levels of transaminase or lactic acid dehydrogenase (LDH)   ? Obesity   ? Peripheral neuropathy   ? Plantar fasciitis   ? PMH of  ? Portal hypertension ([place]) 2017  ? Prostate cancer ([place])   ? Right shoulder pain 11/2017  ? Sleep apnea   ? no cpap, patient denies  ? Thiamine deficiency   ? Wears reading eyeglasses   ? WPW (Wolff-Parkinson-White syndrome) 02/05/2019  ? ?Past Surgical History:  ?Procedure Laterality Date  ? CATARACT EXTRACTION, BILATERAL  12/2011   ? Dr Gershon Crane  ? COLONOSCOPY  2017  ? COLONOSCOPY W/ POLYPECTOMY  07/03/2010  ? 2 adenomas, diverticulosis on right. Dr [name]  ? CYSTOSCOPY N/A 03/15/2018  ? Procedure: CYSTOSCOPY FLEXIBLE;  Surgeon: [name], MD;  Location: [facility];   Service: Urology;  Laterality: N/A;  NO SEEDS FOUND IN BLADDER  ? ESOPHAGOGASTRODUODENOSCOPY (EGD) WITH PROPOFOL N/A 01/08/2016  ? Procedure: ESOPHAGOGASTRODUODENOSCOPY (EGD) WITH PROPOFOL;  Surgeon: [name], MD;  [facility];  Service: Endoscopy;  Laterality: N/A;  ? [place]  ? RADIOACTIVE SEED IMPLANT N/A 03/15/2018  ? Procedure: RADIOACTIVE SEED IMPLANT/BRACHYTHERAPY IMPLANT;  Surgeon: [name], MD;  Location: [facility];  Service: Urology;  Laterality: N/A;   73 SEEDS IMPLANTED  ? SIGMOIDOSCOPY    ? SPACE OAR INSTILLATION N/A 03/15/2018  ? Procedure: SPACE OAR INSTILLATION;  Surgeon: [name], MD;  [facility];  Service: Urology;  Laterality: N/A;  ? WISDOM TOOTH EXTRACTION    ? ?Patient Active Problem List  ? Diagnosis Date Noted  ? Degenerative arthritis of knee, bilateral 03/27/2021  ? Right leg pain 12/21/2020  ? Chronic lymphocytic leukemia ([place]) 11/08/2020  ? Atypical lymphocytes present on peripheral blood smear 09/26/2020  ? Insulin-requiring or dependent type II diabetes mellitus ([place]) 09/25/2020  ? Primary stabbing headache 03/15/2020  ? Stage 3a chronic kidney disease (  [place]) 03/15/2020  ? Nocturnal headaches 03/14/2020  ? Thiamine deficiency   ? Paroxysmal atrial fibrillation ([place]) 07/19/2019  ? Allergic rhinitis 06/09/2019  ? Erectile dysfunction due to diabetes mellitus ([place]) 06/09/2019  ? Overweight with body mass index (BMI) of 28 to 28.9 in adult 03/17/2019  ? WPW (Wolff-Parkinson-White syndrome) 02/05/2019  ? SBP (spontaneous bacterial peritonitis) (HCC)-December 2020,    ? Encephalopathy, hepatic   ? Degenerative disc disease, cervical 07/21/2018  ? Cervical radiculopathy 07/16/2018  ? OAB (overactive bladder) 07/06/2018  ? Malignant neoplasm of prostate ([place]) 01/13/2018  ? Hyperlipidemia LDL goal <100 11/11/2017  ? Essential hypertension 11/11/2017  ? BPH associated with nocturia 11/11/2017  ?  Erectile dysfunction due to arterial insufficiency 04/07/2017  ? Peripheral vascular disease ([place]) 06/25/2016  ? Alcoholic cirrhosis ([place]) 41/28/7867  ? Type 2 diabetes mellitus with complication, with long-term current use of insulin ([place]) 08/22/2015  ? Hypersomnolence 03/19/2014  ? Peripheral neuropathy 01/11/2013  ? Polyclonal gammopathy 01/11/2013  ? GERD 10/04/2009  ? ? ?PCP: [name], MD ? ?REFERRING PROVIDER: Charlann Boxer ? ?REFERRING DIAG: Bil Knee pain/OA, lumbar pain ? ?THERAPY DIAG:  ?Other abnormalities of gait and mobility ? ?Muscle weakness (generalized) ? ?Acute pain of left knee ? ?Acute pain of right knee ? ?ONSET DATE: 1-2 yrs ago ? ?SUBJECTIVE:  ? ?SUBJECTIVE STATEMENT: ?Pt still having radicular pain in R posterior thigh when laying flat that is bothersome to him. He feels he is doing a bit better with overall strength and balance. Was able to walk further, outdoors over the weekend.   ? ? ?PERTINENT HISTORY: ? Chronic lymphocytic leukemia  ?Peripheral vascular disease   ?Alcoholic cirrhosis   ?Peripheral neuopathy ?DM ?OA ?  ? ? ?PAIN:  ?Are you having pain? Yes ?NPRS scale: 0-2/10 ?Pain location: Bil Knees.  ?Pain orientation: Right and Left  ?PAIN TYPE: aching ?Pain description: intermittent  ?Aggravating factors: Standing, activity, stairs. ?Relieving factors: none stated  ? ?PRECAUTIONS: None ? ?WEIGHT BEARING RESTRICTIONS No ? ?FALLS:  ?Has patient fallen in last 6 months? No, Number of falls: 0 ? ?OCCUPATION: Retired ? ?PLOF: Independent ? ?PATIENT GOALS  Play 9 holes of golf, no falls, be able to step up onto friends boat/yacht.  ? ? ?OBJECTIVE: ? ?COGNITION: ? Overall cognitive status: Within functional limits for tasks assessed   ?  ?SENSATION: ?Decreased sensation in feet, with some hypersensitivity in lower legs. ? ?MUSCLE LENGTH: ?HS: minimal limitation  ? ?PALPATION: Mild tenderness in popliteal region on R, mild tenderness at distal HS. ? ?ROM ?04/03/21:  Lumbar: mod limitation  for flex and ext, Hips: mild limitation for flex and rotation, Knees: WFL ? ?LE MMT: ?04/03/21:  ?Hips: 4-/5, Knees: 4/5,  ? ?LOWER EXTREMITY SPECIAL TESTS:  ? ? ? ?TODAY'S TREATMENT: ? ?04/29/21: ?Therapeutic Exercise: ?Aerobic:  Recumbent bike L2 x 8 min;  ?Supine:  ?Seated:  Sit to stand, regular chair x 10;   ?Standing: Fwd step ups x 10 bil, 1 (light) UE support;  Step ups fwd x10 bil 6 in with 1 UE support, 12 in x 3 bil; 2 UE support;  ?Stretches:  Seated HS stretch 30 sec x 3 bil; Seated lumbar flexion 20 sec x 5; Supine SKTC 30 sec x 4 on R; Pelvic tilts x 20;  ?Neuromuscular Re-education: AirEx: A/P weight shifts x 20; L/R  weight shifts trunk rotation x 15;  AirEx: step ups fwd and lat x 25 ea;  ?Manual Therapy: Manual hip flexor stretching (prone),  Lumbar PA mobs, SI mobs on R; Long leg distraction on R for lumbar pump  ? ? ? ?04/22/21: ?Therapeutic Exercise: ?Aerobic:  Recumbent bike L2 x 8 min;  ?Supine:  ?Seated:  Sit to stand, regular chair x 10;   ?Standing: Fwd step ups x 10 bil, 1 (light) UE support;  Stretches:  Seated HS stretch 30 sec x 3 bil;  ? ?Neuromuscular Re-education: AirEx: A/P weight shifts x 20; L/R  weight shifts trunk rotation x 15;  ?Golf swing-moderate swing speed and moderate trunk rotation 2 x 5;  Side stepping 25 ft x 6; UE tbands ext blue x 20 for balance; Rows Blue TB x 20 for posture; Lateral lunge weight shifts x 15 bil with UE (4lb) reach;  UE rotation with UE reach (4lb) x 15 bil;  ?Manual Therapy: ?Therapeutic Activity: ? ? ? ?  ? ?PATIENT EDUCATION:  ?Education details:  reviewed HEP.  ?Person educated: Patient ?Education method: Explanation, Demonstration, Tactile cues, Verbal cues, and Handouts ?Education comprehension: verbalized understanding, returned demonstration, verbal cues required, tactile cues required, and needs further education ? ? ?HOME EXERCISE PROGRAM: ?Access Code: [email or web address] ?Updated 04/08/21 ? ? ?ASSESSMENT: ? ?CLINICAL IMPRESSION: ?04/29/21:   Pt showing  good improvements with strength and stability. He still has some weakness and instability noted in L knee, but is doing well with progressive there ex for strengthening. He is doing much better with dynamic ba

## 2021-04-30 ENCOUNTER — Ambulatory Visit (INDEPENDENT_AMBULATORY_CARE_PROVIDER_SITE_OTHER): Payer: Medicare Other | Admitting: Family Medicine

## 2021-04-30 ENCOUNTER — Other Ambulatory Visit: Payer: Self-pay

## 2021-04-30 VITALS — BP 120/78 | HR 90 | Ht 74.0 in | Wt 246.0 lb

## 2021-04-30 DIAGNOSIS — M48061 Spinal stenosis, lumbar region without neurogenic claudication: Secondary | ICD-10-CM | POA: Diagnosis not present

## 2021-04-30 DIAGNOSIS — M170 Bilateral primary osteoarthritis of knee: Secondary | ICD-10-CM

## 2021-04-30 DIAGNOSIS — M79604 Pain in right leg: Secondary | ICD-10-CM | POA: Diagnosis not present

## 2021-04-30 DIAGNOSIS — M545 Low back pain, unspecified: Secondary | ICD-10-CM

## 2021-04-30 NOTE — Assessment & Plan Note (Signed)
Patient is feeling significantly better after steroid injections.  Discussed the possibility of viscosupplementation which we did get approval but patient would like to hold at this time.  Patient will continue to work with his trainer for strengthening especially with the VMO.  Follow-up with me again in 2 months ?

## 2021-04-30 NOTE — Assessment & Plan Note (Signed)
Patient signs and symptoms consistent with more of the spinal stenosis at L4-L5 as well as somewhat at the L5-S1 area.  Patient does have arthritic changes of both of these levels as well.  At this point I do feel that the patient would respond extremely well though to a potential epidural.  Patient understands the potential risk benefits of this.  Continue on the gabapentin.  Depending on how patient responds I do think that this could be could be contribute to another injection if necessary.  Patient will increase activity afterwards and has been working on strengthening.  Follow-up with me again 6 weeks after the epidural ?

## 2021-04-30 NOTE — Patient Instructions (Addendum)
Good to see you! ?[place] [phone] ?Call Today ? ?Keep monitoring the knees ?See you again 6 weeks after the epidural ?

## 2021-05-01 ENCOUNTER — Encounter: Payer: Self-pay | Admitting: Physical Therapy

## 2021-05-01 ENCOUNTER — Ambulatory Visit (INDEPENDENT_AMBULATORY_CARE_PROVIDER_SITE_OTHER): Payer: Medicare Other | Admitting: Physical Therapy

## 2021-05-01 DIAGNOSIS — R2689 Other abnormalities of gait and mobility: Secondary | ICD-10-CM | POA: Diagnosis not present

## 2021-05-01 DIAGNOSIS — M25562 Pain in left knee: Secondary | ICD-10-CM

## 2021-05-01 DIAGNOSIS — M25561 Pain in right knee: Secondary | ICD-10-CM | POA: Diagnosis not present

## 2021-05-01 DIAGNOSIS — M6281 Muscle weakness (generalized): Secondary | ICD-10-CM | POA: Diagnosis not present

## 2021-05-01 NOTE — Therapy (Signed)
?OUTPATIENT PHYSICAL THERAPY TREATMENT ? ? ?Patient Name: Patrick Kidd ?MRN: [id number] ?DOB:Mar 27, 1945, 76 y.o., male ?Today's Date: 05/01/2021 ? ? ? ? PT End of Session - 05/01/21 1215   ? ? Visit Number 9   ? Number of Visits 16   ? Date for PT Re-Evaluation 05/29/21   ? Authorization Type Medicare   ? PT Start Time 1100   ? PT Stop Time 1638   ? PT Time Calculation (min) 42 min   ? Equipment Utilized During Treatment Gait belt   ? Activity Tolerance Patient tolerated treatment well   ? Behavior During Therapy [facility] for tasks assessed/performed   ? ?  ?  ? ?  ? ? ? ? ? ? ? ? ?Past Medical History:  ?Diagnosis Date  ? Acrophobia   ? Alcoholic cirrhosis ([place]) 4/66/5993  ? Anemia   ? Arthritis   ? foot by big toe  ? BPH associated with nocturia   ? Cataract   ? removed both eyes  ? Chronic cough   ? PMH of  ? COVID-19 12/2018  ? Diabetes mellitus without complication ([place])   ? Diverticulosis 07-03-2010  ? Colonoscopy.   ? Duodenal ulcer 2017  ? Fallen arches   ? Bilateral  ? GERD (gastroesophageal reflux disease)   ? Gout   ? Granuloma annulare   ? Hx of adenomatous colonic polyps multiple  ? Hydrocele 2011  ? Large septated right hydrocele  ? Liver cyst   ? Liver lesion   ? Nonspecific elevation of levels of transaminase or lactic acid dehydrogenase (LDH)   ? Obesity   ? Peripheral neuropathy   ? Plantar fasciitis   ? PMH of  ? Portal hypertension ([place]) 2017  ? Prostate cancer ([place])   ? Right shoulder pain 11/2017  ? Sleep apnea   ? no cpap, patient denies  ? Thiamine deficiency   ? Wears reading eyeglasses   ? WPW (Wolff-Parkinson-White syndrome) 02/05/2019  ? ?Past Surgical History:  ?Procedure Laterality Date  ? CATARACT EXTRACTION, BILATERAL  12/2011   ? Dr Gershon Crane  ? COLONOSCOPY  2017  ? COLONOSCOPY W/ POLYPECTOMY  07/03/2010  ? 2 adenomas, diverticulosis on right. Dr [name]  ? CYSTOSCOPY N/A 03/15/2018  ? Procedure: CYSTOSCOPY FLEXIBLE;  Surgeon: [name], MD;  Location: [facility];   Service: Urology;  Laterality: N/A;  NO SEEDS FOUND IN BLADDER  ? ESOPHAGOGASTRODUODENOSCOPY (EGD) WITH PROPOFOL N/A 01/08/2016  ? Procedure: ESOPHAGOGASTRODUODENOSCOPY (EGD) WITH PROPOFOL;  Surgeon: [name], MD;  [facility];  Service: Endoscopy;  Laterality: N/A;  ? [place]  ? RADIOACTIVE SEED IMPLANT N/A 03/15/2018  ? Procedure: RADIOACTIVE SEED IMPLANT/BRACHYTHERAPY IMPLANT;  Surgeon: [name], MD;  [facility]  Service: Urology;  Laterality: N/A;   73 SEEDS IMPLANTED  ? SIGMOIDOSCOPY    ? SPACE OAR INSTILLATION N/A 03/15/2018  ? Procedure: SPACE OAR INSTILLATION;  Surgeon: [name], MD;  Location: [facility];  Service: Urology;  Laterality: N/A;  ? WISDOM TOOTH EXTRACTION    ? ?Patient Active Problem List  ? Diagnosis Date Noted  ? Degenerative lumbar spinal stenosis 04/30/2021  ? Degenerative arthritis of knee, bilateral 03/27/2021  ? Right leg pain 12/21/2020  ? Chronic lymphocytic leukemia ([place]) 11/08/2020  ? Atypical lymphocytes present on peripheral blood smear 09/26/2020  ? Insulin-requiring or dependent type II diabetes mellitus ([place]) 09/25/2020  ? Primary stabbing headache  03/15/2020  ? Stage 3a chronic kidney disease ([place]) 03/15/2020  ? Nocturnal headaches 03/14/2020  ? Thiamine deficiency   ? Paroxysmal atrial fibrillation ([place]) 07/19/2019  ? Allergic rhinitis 06/09/2019  ? Erectile dysfunction due to diabetes mellitus ([place]) 06/09/2019  ? Overweight with body mass index (BMI) of 28 to 28.9 in adult 03/17/2019  ? WPW (Wolff-Parkinson-White syndrome) 02/05/2019  ? SBP (spontaneous bacterial peritonitis) (HCC)-December 2020,    ? Encephalopathy, hepatic   ? Degenerative disc disease, cervical 07/21/2018  ? Cervical radiculopathy 07/16/2018  ? OAB (overactive bladder) 07/06/2018  ? Malignant neoplasm of prostate ([place]) 01/13/2018  ? Hyperlipidemia LDL goal <100 11/11/2017  ? Essential hypertension 11/11/2017   ? BPH associated with nocturia 11/11/2017  ? Erectile dysfunction due to arterial insufficiency 04/07/2017  ? Peripheral vascular disease ([place]) 06/25/2016  ? Alcoholic cirrhosis ([place]) 16/11/9602  ? Type 2 diabetes mellitus with complication, with long-term current use of insulin ([place]) 08/22/2015  ? Hypersomnolence 03/19/2014  ? Peripheral neuropathy 01/11/2013  ? Polyclonal gammopathy 01/11/2013  ? GERD 10/04/2009  ? ? ?PCP: [name], MD ? ?REFERRING PROVIDER: Charlann Boxer ? ?REFERRING DIAG: Bil Knee pain/OA, lumbar pain ? ?THERAPY DIAG:  ?Other abnormalities of gait and mobility ? ?Muscle weakness (generalized) ? ?Acute pain of left knee ? ?Acute pain of right knee ? ?ONSET DATE: 1-2 yrs ago ? ?SUBJECTIVE:  ? ?SUBJECTIVE STATEMENT: ?Pt saw MD yesterday- will be scheduled for epidural shot in back. He has mild increase in L knee pain today, stepped off curb yesterday when walking, and felt increased pain. He is feeling improvements in strength and balance.    ? ? ?PERTINENT HISTORY: ? Chronic lymphocytic leukemia  ?Peripheral vascular disease   ?Alcoholic cirrhosis   ?Peripheral neuopathy ?DM ?OA ?  ? ? ?PAIN:  ?Are you having pain? Yes ?NPRS scale: 0-2/10 ?Pain location: Bil Knees.  ?Pain orientation: Right and Left  ?PAIN TYPE: aching ?Pain description: intermittent  ?Aggravating factors: Standing, activity, stairs. ?Relieving factors: none stated  ? ?PRECAUTIONS: None ? ?WEIGHT BEARING RESTRICTIONS No ? ?FALLS:  ?Has patient fallen in last 6 months? No, Number of falls: 0 ? ?OCCUPATION: Retired ? ?PLOF: Independent ? ?PATIENT GOALS  Play 9 holes of golf, no falls, be able to step up onto friends boat/yacht.  ? ? ?OBJECTIVE: ? ?COGNITION: ? Overall cognitive status: Within functional limits for tasks assessed   ?  ?SENSATION: ?Decreased sensation in feet, with some hypersensitivity in lower legs. ? ?MUSCLE LENGTH: ?HS: minimal limitation  ? ?PALPATION: Mild tenderness in popliteal region on R, mild  tenderness at distal HS. ? ?ROM ?04/03/21:  Lumbar: mod limitation for flex and ext, Hips: mild limitation for flex and rotation, Knees: WFL ? ?LE MMT: ?04/03/21:  ?Hips: 4-/5, Knees: 4/5,  ? ?LOWER EXTREMITY SPECIAL TESTS:  ? ? ? ?TODAY'S TREATMENT: ?05/01/21: ?Therapeutic Exercise: ?Aerobic:   ?Supine: Heel slides x 15 on L,  SLR x 10 on L;  ?Seated:   ?Standing:  ?Stretches: Seated HS stretch 30 sec x 3 bil; Seated lumbar flexion 20 sec x 5; Supine SKTC 30 sec x 4 on R; Pelvic tilts x 20;  ?Neuromuscular Re-education: AirEx: A/P and L/R weight shifts x 20;  L/R trunk rotation x 15 bil; Fwd and lateral reach x 10 bil;  step ups fwd  x 15 ea;  ?Manual Therapy:  ? ? ? ?PATIENT EDUCATION:  ?Education details:  reviewed HEP.  ?Person educated: Patient ?Education method: Explanation, Demonstration, Tactile cues, Verbal cues,  and Handouts ?Education comprehension: verbalized understanding, returned demonstration, verbal cues required, tactile cues required, and needs further education ? ? ?HOME EXERCISE PROGRAM: ?Access Code: [email or web address] ?Updated 04/08/21 ? ? ?ASSESSMENT: ? ?CLINICAL IMPRESSION: ?05/01/21:   Pt improving with balance and strength. Some decreased activity (step ups) today due to increased pain in L knee. He did much better with walking on unstable ground, and much improved balance on AirEx from previous sessions. Discussed activity modifications for increased pain in L knee, pt to benefit from continued care for strength and balance.  ? ?OBJECTIVE IMPAIRMENTS Abnormal gait, decreased activity tolerance, decreased balance, decreased cognition, decreased endurance, decreased mobility, difficulty walking, decreased ROM, decreased strength, decreased safety awareness, increased muscle spasms, impaired flexibility, impaired sensation, improper body mechanics, obesity, and pain.  ? ?ACTIVITY LIMITATIONS cleaning, community activity, occupation, yard work, and shopping.  ? ?PERSONAL FACTORS Fitness are also affecting  patient's functional outcome.  ? ? ?REHAB POTENTIAL: Good ? ?CLINICAL DECISION MAKING: Stable/uncomplicated ? ?EVALUATION COMPLEXITY: Low ? ? ?GOALS: ?Goals reviewed with patient? Yes ? ?SHORT TERM GOALS: ? ?STG Na

## 2021-05-02 DIAGNOSIS — Z7984 Long term (current) use of oral hypoglycemic drugs: Secondary | ICD-10-CM | POA: Diagnosis not present

## 2021-05-02 DIAGNOSIS — Z794 Long term (current) use of insulin: Secondary | ICD-10-CM | POA: Diagnosis not present

## 2021-05-02 DIAGNOSIS — Z961 Presence of intraocular lens: Secondary | ICD-10-CM | POA: Diagnosis not present

## 2021-05-02 DIAGNOSIS — E119 Type 2 diabetes mellitus without complications: Secondary | ICD-10-CM | POA: Diagnosis not present

## 2021-05-03 ENCOUNTER — Ambulatory Visit
Admission: RE | Admit: 2021-05-03 | Discharge: 2021-05-03 | Disposition: A | Discharge status: Home / Self Care | Payer: Medicare Other | Source: Ambulatory Visit | Attending: Family Medicine | Admitting: Family Medicine

## 2021-05-03 DIAGNOSIS — M47817 Spondylosis without myelopathy or radiculopathy, lumbosacral region: Secondary | ICD-10-CM | POA: Diagnosis not present

## 2021-05-03 DIAGNOSIS — M545 Low back pain, unspecified: Secondary | ICD-10-CM

## 2021-05-03 DIAGNOSIS — M79604 Pain in right leg: Secondary | ICD-10-CM

## 2021-05-03 MED ORDER — METHYLPREDNISOLONE ACETATE 40 MG/ML INJ SUSP (RADIOLOG
80.0000 mg | Freq: Once | INTRAMUSCULAR | Status: AC
  Administered 2021-05-03: 80 mg via EPIDURAL

## 2021-05-03 MED ORDER — IOPAMIDOL (ISOVUE-M 200) INJECTION 41%
1.0000 mL | Freq: Once | INTRAMUSCULAR | Status: AC
Start: 2021-05-03 — End: 2021-05-03
  Administered 2021-05-03: 1 mL via EPIDURAL

## 2021-05-03 NOTE — Discharge Instructions (Signed)

## 2021-05-06 ENCOUNTER — Ambulatory Visit (INDEPENDENT_AMBULATORY_CARE_PROVIDER_SITE_OTHER): Payer: Medicare Other | Admitting: Physical Therapy

## 2021-05-06 ENCOUNTER — Encounter: Payer: Self-pay | Admitting: Physical Therapy

## 2021-05-06 DIAGNOSIS — M6281 Muscle weakness (generalized): Secondary | ICD-10-CM | POA: Diagnosis not present

## 2021-05-06 DIAGNOSIS — M25561 Pain in right knee: Secondary | ICD-10-CM

## 2021-05-06 DIAGNOSIS — M25562 Pain in left knee: Secondary | ICD-10-CM | POA: Diagnosis not present

## 2021-05-06 DIAGNOSIS — R2689 Other abnormalities of gait and mobility: Secondary | ICD-10-CM

## 2021-05-06 NOTE — Therapy (Signed)
?OUTPATIENT PHYSICAL THERAPY TREATMENT/Progress Note ? ? ?Patient Name: Patrick Kidd ?MRN: [id number] ?DOB:[birth year], 76 y.o., male ?Today's Date: 05/06/2021 ? ?Physical Therapy Progress Note ? ? ?Dates of Reporting Period: 04/03/21  to 05/06/21 ? ? ? ? PT End of Session - 05/06/21 1605   ? ? Visit Number 10   ? Number of Visits 16   ? Date for PT Re-Evaluation 05/29/21   ? Authorization Type Medicare  PN done at visit 10.   ? PT Start Time 1601   ? PT Stop Time 2440   ? PT Time Calculation (min) 42 min   ? Equipment Utilized During Treatment Gait belt   ? Activity Tolerance Patient tolerated treatment well   ? Behavior During Therapy [facility] for tasks assessed/performed   ? ?  ?  ? ?  ? ? ? ? ? ? ? ? ? ?Past Medical History:  ?Diagnosis Date  ? Acrophobia   ? Alcoholic cirrhosis ([place]) 02/05/7251  ? Anemia   ? Arthritis   ? foot by big toe  ? BPH associated with nocturia   ? Cataract   ? removed both eyes  ? Chronic cough   ? PMH of  ? COVID-19 12/2018  ? Diabetes mellitus without complication ([place])   ? Diverticulosis 07-03-2010  ? Colonoscopy.   ? Duodenal ulcer 2017  ? Fallen arches   ? Bilateral  ? GERD (gastroesophageal reflux disease)   ? Gout   ? Granuloma annulare   ? Hx of adenomatous colonic polyps multiple  ? Hydrocele 2011  ? Large septated right hydrocele  ? Liver cyst   ? Liver lesion   ? Nonspecific elevation of levels of transaminase or lactic acid dehydrogenase (LDH)   ? Obesity   ? Peripheral neuropathy   ? Plantar fasciitis   ? PMH of  ? Portal hypertension ([place]) 2017  ? Prostate cancer ([place])   ? Right shoulder pain 11/2017  ? Sleep apnea   ? no cpap, patient denies  ? Thiamine deficiency   ? Wears reading eyeglasses   ? WPW (Wolff-Parkinson-White syndrome) 02/05/2019  ? ?Past Surgical History:  ?Procedure Laterality Date  ? CATARACT EXTRACTION, BILATERAL  12/2011   ? Dr Gershon Crane  ? COLONOSCOPY  2017  ? COLONOSCOPY W/ POLYPECTOMY  07/03/2010  ? 2 adenomas, diverticulosis on right. Dr [name]  ? CYSTOSCOPY  N/A 03/15/2018  ? Procedure: CYSTOSCOPY FLEXIBLE;  Surgeon: [name], MD;  Location[facility];  Service: Urology;  Laterality: N/A;  NO SEEDS FOUND IN BLADDER  ? ESOPHAGOGASTRODUODENOSCOPY (EGD) WITH PROPOFOL N/A 01/08/2016  ? Procedure: ESOPHAGOGASTRODUODENOSCOPY (EGD) WITH PROPOFOL;  Surgeon: [name], MD;  [facility];  Service: Endoscopy;  Laterality: N/A;  ? [place]  ? RADIOACTIVE SEED IMPLANT N/A 03/15/2018  ? Procedure: RADIOACTIVE SEED IMPLANT/BRACHYTHERAPY IMPLANT;  Surgeon: [name], MD;  Location: [facility];  Service: Urology;  Laterality: N/A;   73 SEEDS IMPLANTED  ? SIGMOIDOSCOPY    ? SPACE OAR INSTILLATION N/A 03/15/2018  ? Procedure: SPACE OAR INSTILLATION;  Surgeon: [name], MD;  Location[facility];  Service: Urology;  Laterality: N/A;  ? WISDOM TOOTH EXTRACTION    ? ?Patient Active Problem List  ? Diagnosis Date Noted  ? Degenerative lumbar spinal stenosis 04/30/2021  ? Degenerative arthritis of knee, bilateral 03/27/2021  ? Right leg pain 12/21/2020  ? Chronic lymphocytic leukemia ([place]) 11/08/2020  ? Atypical  lymphocytes present on peripheral blood smear 09/26/2020  ? Insulin-requiring or dependent type II diabetes mellitus ([place]) 09/25/2020  ? Primary stabbing headache 03/15/2020  ? Stage 3a chronic kidney disease ([place]) 03/15/2020  ? Nocturnal headaches 03/14/2020  ? Thiamine deficiency   ? Paroxysmal atrial fibrillation ([place]) 07/19/2019  ? Allergic rhinitis 06/09/2019  ? Erectile dysfunction due to diabetes mellitus ([place]) 06/09/2019  ? Overweight with body mass index (BMI) of 28 to 28.9 in adult 03/17/2019  ? WPW (Wolff-Parkinson-White syndrome) 02/05/2019  ? SBP (spontaneous bacterial peritonitis) (HCC)-December 2020,    ? Encephalopathy, hepatic ([place])   ? Degenerative disc disease, cervical 07/21/2018  ? Cervical radiculopathy 07/16/2018  ? OAB (overactive bladder) 07/06/2018  ?  Malignant neoplasm of prostate ([place]) 01/13/2018  ? Hyperlipidemia LDL goal <100 11/11/2017  ? Essential hypertension 11/11/2017  ? BPH associated with nocturia 11/11/2017  ? Erectile dysfunction due to arterial insufficiency 04/07/2017  ? Peripheral vascular disease ([place]) 06/25/2016  ? Alcoholic cirrhosis ([place]) 03/70/4888  ? Type 2 diabetes mellitus with complication, with long-term current use of insulin ([place]) 08/22/2015  ? Hypersomnolence 03/19/2014  ? Peripheral neuropathy 01/11/2013  ? Polyclonal gammopathy 01/11/2013  ? GERD 10/04/2009  ? ? ?PCP: [name], MD ? ?REFERRING PROVIDER: Charlann Boxer ? ?REFERRING DIAG: Bil Knee pain/OA, lumbar pain ? ?THERAPY DIAG:  ?Other abnormalities of gait and mobility ? ?Muscle weakness (generalized) ? ?Acute pain of left knee ? ?Acute pain of right knee ? ?ONSET DATE: 1-2 yrs ago ? ?SUBJECTIVE:  ? ?SUBJECTIVE STATEMENT: ?Pt had epidural on Friday. Notes some improvement in pain, and is sleeping better.     ? ? ?PERTINENT HISTORY: ? Chronic lymphocytic leukemia  ?Peripheral vascular disease   ?Alcoholic cirrhosis   ?Peripheral neuopathy ?DM ?OA ?  ? ? ?PAIN:  ?Are you having pain? Yes ?NPRS scale: 0-2/10 ?Pain location: Bil Knees.  ?Pain orientation: Right and Left  ?PAIN TYPE: aching ?Pain description: intermittent  ?Aggravating factors: Standing, activity, stairs. ?Relieving factors: none stated  ? ?PRECAUTIONS: None ? ?WEIGHT BEARING RESTRICTIONS No ? ?FALLS:  ?Has patient fallen in last 6 months? No, Number of falls: 0 ? ?OCCUPATION: Retired ? ?PLOF: Independent ? ?PATIENT GOALS  Play 9 holes of golf, no falls, be able to step up onto friends boat/yacht.  ? ? ?OBJECTIVE: ? ?COGNITION: ? Overall cognitive status: Within functional limits for tasks assessed   ?  ?SENSATION: ?Decreased sensation in feet, with some hypersensitivity in lower legs. ? ?MUSCLE LENGTH: ?HS: minimal limitation  ? ?PALPATION:  ? ?ROM ?05/06/21:  Lumbar: min limitation for flex and ext, Hips: mild  limitation for rotation, Knees: WFL ? ?LE MMT: ?05/06/21:  ?Hips: 4/5,  L Knee: 4+/5, R: 5/5  ? ?LOWER EXTREMITY SPECIAL TESTS:  ? ? ? ?TODAY'S TREATMENT: ?05/06/21: ?Therapeutic Exercise: ?Aerobic:  L1 x 8 min;  ?Supine:  ?Seated: Sit to stand x10;  ?Standing: Step ups 6 in x 10 bil;  ?Stretches:  Seated HS stretch 30 sec x 3 bil; Neuromuscular Re-education:  AirEx:  L/R weight shifts x 20;  L/R trunk rotation x 15 bil; with reach 5lb x 10 bil;  Toe taps 6 in x 20 no UE support;  quick walking with direction changes x 210f;  ?Manual Therapy:  ? ? ?05/01/21: ?Therapeutic Exercise: ?Aerobic:   ?Supine: Heel slides x 15 on L,  SLR x 10 on L;  ?Seated:   ?Standing:  ?Stretches: Seated HS stretch 30 sec x 3 bil; Seated lumbar flexion 20  sec x 5; Supine SKTC 30 sec x 4 on R; Pelvic tilts x 20;  ?Neuromuscular Re-education: AirEx: A/P and L/R weight shifts x 20;  L/R trunk rotation x 15 bil; Fwd and lateral reach x 10 bil;  step ups fwd  x 15 ea;  ?Manual Therapy:  ? ? ? ?PATIENT EDUCATION:  ?Education details:  reviewed HEP.  ?Person educated: Patient ?Education method: Explanation, Demonstration, Tactile cues, Verbal cues, and Handouts ?Education comprehension: verbalized understanding, returned demonstration, verbal cues required, tactile cues required, and needs further education ? ? ?HOME EXERCISE PROGRAM: ?Access Code: [email or web address] ?Updated 04/08/21 ? ? ?ASSESSMENT: ? ?CLINICAL IMPRESSION: ?05/06/21: PN:  ?Discussed not over doing activity, even if pt is having decreased pain levels at this time. Pt doing well with ability for strengthening. He is making good progress , noted increased strength in hips with testing today. He still has mild balance and stability deficits with dynamic activity and unstable surfaces. He Will continue to benefit from balance, stability, and LE strength in order to meet LTGs.  ? ? ?OBJECTIVE IMPAIRMENTS Abnormal gait, decreased activity tolerance, decreased balance, decreased cognition, decreased  endurance, decreased mobility, difficulty walking, decreased ROM, decreased strength, decreased safety awareness, increased muscle spasms, impaired flexibility, impaired sensation, improper body mechanics

## 2021-05-07 ENCOUNTER — Other Ambulatory Visit: Payer: Self-pay | Admitting: Internal Medicine

## 2021-05-07 ENCOUNTER — Telehealth: Payer: Self-pay

## 2021-05-07 NOTE — Progress Notes (Signed)
? ? ?Chronic Care Management ?Pharmacy Assistant  ? ?Name: ROMIR KLIMOWICZ  MRN: [id number] DOB: [birth year] ? ?Patrick Kidd is an 76 y.o. year old male who was called for his follow-up assessment call.  ? ?Reason for Encounter: Disease State ?  ?Conditions to be addressed/monitored: ?DMII ? ? ?Recent office visits:  ?None ID ? ?Recent consult visits:  ?04/30/21 Lyndal Pulley, DO-Sports Medicine (Right leg pain) orders: Fransico Michael DIAG/THERA/INC NEEDLE/CATH/PLC EPI/LUMB/SAC W/IMG ? ?03/27/21 Lyndal Pulley, DO-Sports Medicine (Right leg pain) orders:MR LUMBAR SPINE WO CONTRAST, MR PELVIS WO CONTRAST, [place] LIMITED JOINT SPACE STRUCTURES LOW RIGHT ? ?Hospital visits:  ?None in previous 6 months ? ?Medications: ?Outpatient Encounter Medications as of 05/07/2021  ?Medication Sig Note  ? blood glucose meter kit and supplies KIT Use to test blood sugar once daily. DX E11.8   ? Colchicine (MITIGARE) 0.6 MG CAPS Take 1 tablet by mouth daily. 11/16/2019: PRN  ? FARXIGA 10 MG TABS tablet Take 1 tablet (10 mg total) by mouth daily before breakfast.   ? folic acid (FOLVITE) 1 MG tablet TAKE 1 TABLET BY MOUTH EVERY DAY   ? gabapentin (NEURONTIN) 300 MG capsule Take 1 capsule (300 mg total) by mouth 3 (three) times daily.   ? glucose blood (COOL BLOOD GLUCOSE TEST STRIPS) test strip Use to check blood sugar daily. DX: E11.8   ? insulin glargine, 2 Unit Dial, (TOUJEO MAX SOLOSTAR) 300 UNIT/ML Solostar Pen Inject 30 Units into the skin daily. Via [facility] pt assistance (Patient taking differently: Inject 50 Units into the skin daily. Via [facility] pt assistance)   ? Lancets (ACCU-CHEK MULTICLIX) lancets Use to check sugar daily. DX: E11.8   ? metFORMIN (GLUCOPHAGE) 500 MG tablet TAKE 1 TABLET BY MOUTH 2 TIMES DAILY WITH A MEAL.   ? Multiple Vitamin (MULTIVITAMIN WITH MINERALS) TABS tablet Take 1 tablet by mouth daily.   ? pantoprazole (PROTONIX) 40 MG tablet TAKE 1 TABLET 30 MINUTES BEFORE MEALS (BREAKFAST AND SUPPER) TWICE A DAY    ? tadalafil (CIALIS) 20 MG tablet Take 1 tablet (20 mg total) by mouth daily as needed for erectile dysfunction.   ? vitamin B-12 (CYANOCOBALAMIN) 1000 MCG tablet Take 1,000 mcg by mouth daily.   ? ?No facility-administered encounter medications on file as of 05/07/2021.  ? ?Recent Relevant Labs: ?Lab Results  ?Component Value Date/Time  ? HGBA1C 7.5 (H) 02/06/2021 02:43 PM  ? HGBA1C 8.3 (H) 08/28/2020 11:30 AM  ? MICROALBUR <0.7 09/25/2020 02:36 PM  ? MICROALBUR 3.4 (H) 03/14/2019 10:36 AM  ?  ?Kidney Function ?Lab Results  ?Component Value Date/Time  ? CREATININE 1.21 02/06/2021 02:24 PM  ? CREATININE 1.22 11/08/2020 10:45 AM  ? CREATININE 1.39 (H) 10/04/2020 03:18 PM  ? CREATININE 1.13 06/04/2010 01:54 PM  ? GFR 58.25 (L) 02/06/2021 02:24 PM  ? GFRNONAA >60 11/08/2020 10:45 AM  ? GFRAA >60 02/06/2019 05:46 AM  ? ? ?Current antihyperglycemic regimen:  ?Metformin 500 mg  ?Toujeo Max Solostar 300/unit ml ?       Farxiga 10 mg 1 tab daily(Called [facility] to check on delivery. Pt is about out) ? ?What recent interventions/DTPs have been made to improve glycemic control:  ?None noted ? ?Have there been any recent hospitalizations or ED visits since last visit with CPP? No ? ?Patient denies hypoglycemic symptoms, including None ? ?Patient denies hyperglycemic symptoms, including none ? ?How often are you checking your blood sugar? 3-4 times daily ? ?What are your blood sugars ranging?  Patient states that blood sugar is between 120-160 ? ?During the week, how often does your blood glucose drop below 70? Never ? ?Are you checking your feet daily/regularly?  ? ?Adherence Review: ?Is the patient currently on a STATIN medication? No ?Is the patient currently on ACE/ARB medication? No ?Does the patient have >5 day gap between last estimated fill dates? No ? ? ?  ?Care Gaps: ?Colonoscopy - 03/31/19 ?Diabetic Foot Exam - 09/25/20 ?Ophthalmology - 04/26/20 ?Dexa Scan - NA ?Annual Well Visit - NA ?Micro albumin -  09/25/21 ?Hemoglobin  A1c -  02/06/21 ? ?Star Rating Drugs: ?Metformin 500 mg - last filled 04/25/21 90D ?Farxiga 10 mg- daily last filled - 03/07/21 (receiving through [facility]) ? ?Ethelene Hal ?Clinical Pharmacist Assistant ?[phone]  ?

## 2021-05-08 ENCOUNTER — Ambulatory Visit (INDEPENDENT_AMBULATORY_CARE_PROVIDER_SITE_OTHER): Payer: Medicare Other | Admitting: Physical Therapy

## 2021-05-08 ENCOUNTER — Encounter: Payer: Self-pay | Admitting: Physical Therapy

## 2021-05-08 ENCOUNTER — Encounter: Payer: Medicare Other | Admitting: Physical Therapy

## 2021-05-08 DIAGNOSIS — M25561 Pain in right knee: Secondary | ICD-10-CM | POA: Diagnosis not present

## 2021-05-08 DIAGNOSIS — R2689 Other abnormalities of gait and mobility: Secondary | ICD-10-CM | POA: Diagnosis not present

## 2021-05-08 DIAGNOSIS — M25562 Pain in left knee: Secondary | ICD-10-CM

## 2021-05-08 DIAGNOSIS — M6281 Muscle weakness (generalized): Secondary | ICD-10-CM

## 2021-05-08 NOTE — Therapy (Signed)
?OUTPATIENT PHYSICAL THERAPY TREATMENT ? ? ?Patient Name: Patrick Kidd ?MRN: [id number] ?DOB:05-26-1945, 76 y.o., male ?Today's Date: 05/08/2021 ? ? ? ? PT End of Session - 05/08/21 1357   ? ? Visit Number 11   ? Number of Visits 16   ? Date for PT Re-Evaluation 05/29/21   ? Authorization Type Medicare  PN done at visit 10.   ? PT Start Time 1347   ? PT Stop Time 1430   ? PT Time Calculation (min) 43 min   ? Equipment Utilized During Treatment Gait belt   ? Activity Tolerance Patient tolerated treatment well   ? Behavior During Therapy [facility] for tasks assessed/performed   ? ?  ?  ? ?  ? ? ? ? ? ? ? ? ? ? ?Past Medical History:  ?Diagnosis Date  ? Acrophobia   ? Alcoholic cirrhosis ([place]) 05/12/8117  ? Anemia   ? Arthritis   ? foot by big toe  ? BPH associated with nocturia   ? Cataract   ? removed both eyes  ? Chronic cough   ? PMH of  ? COVID-19 12/2018  ? Diabetes mellitus without complication ([place])   ? Diverticulosis 07-03-2010  ? Colonoscopy.   ? Duodenal ulcer 2017  ? Fallen arches   ? Bilateral  ? GERD (gastroesophageal reflux disease)   ? Gout   ? Granuloma annulare   ? Hx of adenomatous colonic polyps multiple  ? Hydrocele 2011  ? Large septated right hydrocele  ? Liver cyst   ? Liver lesion   ? Nonspecific elevation of levels of transaminase or lactic acid dehydrogenase (LDH)   ? Obesity   ? Peripheral neuropathy   ? Plantar fasciitis   ? PMH of  ? Portal hypertension ([place]) 2017  ? Prostate cancer ([place])   ? Right shoulder pain 11/2017  ? Sleep apnea   ? no cpap, patient denies  ? Thiamine deficiency   ? Wears reading eyeglasses   ? WPW (Wolff-Parkinson-White syndrome) 02/05/2019  ? ?Past Surgical History:  ?Procedure Laterality Date  ? CATARACT EXTRACTION, BILATERAL  12/2011   ? Dr Gershon Crane  ? COLONOSCOPY  2017  ? COLONOSCOPY W/ POLYPECTOMY  07/03/2010  ? 2 adenomas, diverticulosis on right. Dr [name]  ? CYSTOSCOPY N/A 03/15/2018  ? Procedure: CYSTOSCOPY FLEXIBLE;  Surgeon: [name], MD;  [facility]:  [facility];  Service: Urology;  Laterality: N/A;  NO SEEDS FOUND IN BLADDER  ? ESOPHAGOGASTRODUODENOSCOPY (EGD) WITH PROPOFOL N/A 01/08/2016  ? Procedure: ESOPHAGOGASTRODUODENOSCOPY (EGD) WITH PROPOFOL;  Surgeon: [name], MD;  Location: [facility];  Service: Endoscopy;  Laterality: N/A;  ? [place]  ? RADIOACTIVE SEED IMPLANT N/A 03/15/2018  ? Procedure: RADIOACTIVE SEED IMPLANT/BRACHYTHERAPY IMPLANT;  Surgeon: [name], MD;  Location: [facility];  Service: Urology;  Laterality: N/A;   73 SEEDS IMPLANTED  ? SIGMOIDOSCOPY    ? SPACE OAR INSTILLATION N/A 03/15/2018  ? Procedure: SPACE OAR INSTILLATION;  Surgeon: [name], MD;  Location: [facility];  Service: Urology;  Laterality: N/A;  ? WISDOM TOOTH EXTRACTION    ? ?Patient Active Problem List  ? Diagnosis Date Noted  ? Degenerative lumbar spinal stenosis 04/30/2021  ? Degenerative arthritis of knee, bilateral 03/27/2021  ? Right leg pain 12/21/2020  ? Chronic lymphocytic leukemia ([place]) 11/08/2020  ? Atypical lymphocytes present on peripheral blood smear 09/26/2020  ? Insulin-requiring or dependent type II diabetes  mellitus ([place]) 09/25/2020  ? Primary stabbing headache 03/15/2020  ? Stage 3a chronic kidney disease ([place]) 03/15/2020  ? Nocturnal headaches 03/14/2020  ? Thiamine deficiency   ? Paroxysmal atrial fibrillation ([place]) 07/19/2019  ? Allergic rhinitis 06/09/2019  ? Erectile dysfunction due to diabetes mellitus ([place]) 06/09/2019  ? Overweight with body mass index (BMI) of 28 to 28.9 in adult 03/17/2019  ? WPW (Wolff-Parkinson-White syndrome) 02/05/2019  ? SBP (spontaneous bacterial peritonitis) (HCC)-December 2020,    ? Encephalopathy, hepatic ([place])   ? Degenerative disc disease, cervical 07/21/2018  ? Cervical radiculopathy 07/16/2018  ? OAB (overactive bladder) 07/06/2018  ? Malignant neoplasm of prostate ([place]) 01/13/2018  ? Hyperlipidemia LDL goal <100 11/11/2017  ?  Essential hypertension 11/11/2017  ? BPH associated with nocturia 11/11/2017  ? Erectile dysfunction due to arterial insufficiency 04/07/2017  ? Peripheral vascular disease ([place]) 06/25/2016  ? Alcoholic cirrhosis ([place]) 56/31/4970  ? Type 2 diabetes mellitus with complication, with long-term current use of insulin ([place]) 08/22/2015  ? Hypersomnolence 03/19/2014  ? Peripheral neuropathy 01/11/2013  ? Polyclonal gammopathy 01/11/2013  ? GERD 10/04/2009  ? ? ?PCP: [name], MD ? ?REFERRING PROVIDER: Charlann Boxer ? ?REFERRING DIAG: Bil Knee pain/OA, lumbar pain ? ?THERAPY DIAG:  ?Other abnormalities of gait and mobility ? ?Muscle weakness (generalized) ? ?Acute pain of left knee ? ?Acute pain of right knee ? ?ONSET DATE: 1-2 yrs ago ? ?SUBJECTIVE:  ? ?SUBJECTIVE STATEMENT: ?Pt had epidural last week. Is noticing decreased pain.     ? ? ?PERTINENT HISTORY: ? Chronic lymphocytic leukemia  ?Peripheral vascular disease   ?Alcoholic cirrhosis   ?Peripheral neuopathy ?DM ?OA ?  ? ? ?PAIN:  ?Are you having pain? Yes ?NPRS scale: 0-2/10 ?Pain location: Bil Knees.  ?Pain orientation: Right and Left  ?PAIN TYPE: aching ?Pain description: intermittent  ?Aggravating factors: Standing, activity, stairs. ?Relieving factors: none stated  ? ?PRECAUTIONS: None ? ?WEIGHT BEARING RESTRICTIONS No ? ?FALLS:  ?Has patient fallen in last 6 months? No, Number of falls: 0 ? ?OCCUPATION: Retired ? ?PLOF: Independent ? ?PATIENT GOALS  Play 9 holes of golf, no falls, be able to step up onto friends boat/yacht.  ? ? ?OBJECTIVE: ? ?COGNITION: ? Overall cognitive status: Within functional limits for tasks assessed   ?  ?SENSATION: ?Decreased sensation in feet, with some hypersensitivity in lower legs. ? ?MUSCLE LENGTH: ?HS: minimal limitation  ? ?PALPATION:  ? ?ROM ?05/06/21:  Lumbar: min limitation for flex and ext, Hips: mild limitation for rotation, Knees: WFL ? ?LE MMT: ?05/06/21:  ?Hips: 4/5,  L Knee: 4+/5, R: 5/5  ? ?LOWER EXTREMITY SPECIAL  TESTS:  ? ? ? ?TODAY'S TREATMENT: ? ?05/08/21: ?Therapeutic Exercise: ?Aerobic:  L2 x 8 min;  ?Supine:  ?Seated:  ?Standing: Stairs up/down 6 in x 10 bil; Marching x 15;  ?Stretches:  Seated HS stretch 30 sec x 3 bil;  ?Neuromuscular Re-education:   Toe taps 6 in x 20 no UE support;  AirEx: A/P weight shifts x 20; Air Ex: step ups fwd/lat x20; marching x 20; step downs x5 bil;  Tandem stance 30 sec;  ?With UE flex x 10 bil;  with head turns x 10 bil;  ?Manual Therapy: ? ? ?05/06/21: ?Therapeutic Exercise: ?Aerobic:  L1 x 8 min;  ?Supine:  ?Seated: Sit to stand x10;  ?Standing: Step ups 6 in x 10 bil;  ?Stretches:  Seated HS stretch 30 sec x 3 bil; Neuromuscular Re-education:  AirEx:  L/R weight shifts x 20;  L/R trunk rotation x 15 bil; with reach 5lb x 10 bil;  Toe taps 6 in x 20 no UE support;  quick walking with direction changes x 233f;  ?Manual Therapy:  ? ? ?PATIENT EDUCATION:  ?Education details:  reviewed HEP.  ?Person educated: Patient ?Education method: Explanation, Demonstration, Tactile cues, Verbal cues, and Handouts ?Education comprehension: verbalized understanding, returned demonstration, verbal cues required, tactile cues required, and needs further education ? ? ?HOME EXERCISE PROGRAM: ?Access Code: [email or web address]?Updated 04/08/21 ? ? ?ASSESSMENT: ? ?CLINICAL IMPRESSION: ?05/08/21:  Pt improving with balance, still very challenged with stability exercises, with addition of higher level exercises and being on unstable surface. Will continue to benefit from dynamic balance, for improved safety and confidence.  ? ? ?OBJECTIVE IMPAIRMENTS Abnormal gait, decreased activity tolerance, decreased balance, decreased cognition, decreased endurance, decreased mobility, difficulty walking, decreased ROM, decreased strength, decreased safety awareness, increased muscle spasms, impaired flexibility, impaired sensation, improper body mechanics, obesity, and pain.  ? ?ACTIVITY LIMITATIONS cleaning, community activity,  occupation, yard work, and shopping.  ? ?PERSONAL FACTORS Fitness are also affecting patient's functional outcome.  ? ? ?REHAB POTENTIAL: Good ? ?CLINICAL DECISION MAKING: Stable/uncomplicated ? ?EVALUATION COMPLEX

## 2021-05-13 ENCOUNTER — Ambulatory Visit (INDEPENDENT_AMBULATORY_CARE_PROVIDER_SITE_OTHER): Payer: Medicare Other | Admitting: Physical Therapy

## 2021-05-13 ENCOUNTER — Encounter: Payer: Self-pay | Admitting: Physical Therapy

## 2021-05-13 DIAGNOSIS — R2689 Other abnormalities of gait and mobility: Secondary | ICD-10-CM | POA: Diagnosis not present

## 2021-05-13 DIAGNOSIS — M25562 Pain in left knee: Secondary | ICD-10-CM | POA: Diagnosis not present

## 2021-05-13 DIAGNOSIS — M6281 Muscle weakness (generalized): Secondary | ICD-10-CM | POA: Diagnosis not present

## 2021-05-13 DIAGNOSIS — M25561 Pain in right knee: Secondary | ICD-10-CM

## 2021-05-13 NOTE — Therapy (Signed)
?OUTPATIENT PHYSICAL THERAPY TREATMENT ? ? ?Patient Name: Patrick Kidd ?MRN: [id number] ?DOB:December 12, 1945, 76 y.o., male ?Today's Date: 05/13/2021 ? ? ? ? PT End of Session - 05/13/21 1605   ? ? Visit Number 12   ? Number of Visits 16   ? Date for PT Re-Evaluation 05/29/21   ? Authorization Type Medicare  PN done at visit 10.   ? PT Start Time 1606   ? PT Stop Time 9735   ? PT Time Calculation (min) 39 min   ? Equipment Utilized During Treatment Gait belt   ? Activity Tolerance Patient tolerated treatment well   ? Behavior During Therapy [facility] for tasks assessed/performed   ? ?  ?  ? ?  ? ? ? ? ? ? ? ? ? ? ?Past Medical History:  ?Diagnosis Date  ? Acrophobia   ? Alcoholic cirrhosis ([place]) 05/01/9240  ? Anemia   ? Arthritis   ? foot by big toe  ? BPH associated with nocturia   ? Cataract   ? removed both eyes  ? Chronic cough   ? PMH of  ? COVID-19 12/2018  ? Diabetes mellitus without complication ([place])   ? Diverticulosis 07-03-2010  ? Colonoscopy.   ? Duodenal ulcer 2017  ? Fallen arches   ? Bilateral  ? GERD (gastroesophageal reflux disease)   ? Gout   ? Granuloma annulare   ? Hx of adenomatous colonic polyps multiple  ? Hydrocele 2011  ? Large septated right hydrocele  ? Liver cyst   ? Liver lesion   ? Nonspecific elevation of levels of transaminase or lactic acid dehydrogenase (LDH)   ? Obesity   ? Peripheral neuropathy   ? Plantar fasciitis   ? PMH of  ? Portal hypertension ([place]) 2017  ? Prostate cancer ([place])   ? Right shoulder pain 11/2017  ? Sleep apnea   ? no cpap, patient denies  ? Thiamine deficiency   ? Wears reading eyeglasses   ? WPW (Wolff-Parkinson-White syndrome) 02/05/2019  ? ?Past Surgical History:  ?Procedure Laterality Date  ? CATARACT EXTRACTION, BILATERAL  12/2011   ? Dr Gershon Crane  ? COLONOSCOPY  2017  ? COLONOSCOPY W/ POLYPECTOMY  07/03/2010  ? 2 adenomas, diverticulosis on right. Dr [name]  ? CYSTOSCOPY N/A 03/15/2018  ? Procedure: CYSTOSCOPY FLEXIBLE;  Surgeon: [name], MD;  Location:  [facility];  Service: Urology;  Laterality: N/A;  NO SEEDS FOUND IN BLADDER  ? ESOPHAGOGASTRODUODENOSCOPY (EGD) WITH PROPOFOL N/A 01/08/2016  ? Procedure: ESOPHAGOGASTRODUODENOSCOPY (EGD) WITH PROPOFOL;  Surgeon: [name], MD;  Location[facility];  Service: Endoscopy;  Laterality: N/A;  ? [place]  ? RADIOACTIVE SEED IMPLANT N/A 03/15/2018  ? Procedure: RADIOACTIVE SEED IMPLANT/BRACHYTHERAPY IMPLANT;  Surgeon: [name], MD;  Location: [facility];  Service: Urology;  Laterality: N/A;   73 SEEDS IMPLANTED  ? SIGMOIDOSCOPY    ? SPACE OAR INSTILLATION N/A 03/15/2018  ? Procedure: SPACE OAR INSTILLATION;  Surgeon: [name], MD;  Location: [facility];  Service: Urology;  Laterality: N/A;  ? WISDOM TOOTH EXTRACTION    ? ?Patient Active Problem List  ? Diagnosis Date Noted  ? Degenerative lumbar spinal stenosis 04/30/2021  ? Degenerative arthritis of knee, bilateral 03/27/2021  ? Right leg pain 12/21/2020  ? Chronic lymphocytic leukemia ([place]) 11/08/2020  ? Atypical lymphocytes present on peripheral blood smear 09/26/2020  ? Insulin-requiring or dependent type II diabetes  mellitus ([place]) 09/25/2020  ? Primary stabbing headache 03/15/2020  ? Stage 3a chronic kidney disease ([place]) 03/15/2020  ? Nocturnal headaches 03/14/2020  ? Thiamine deficiency   ? Paroxysmal atrial fibrillation ([place]) 07/19/2019  ? Allergic rhinitis 06/09/2019  ? Erectile dysfunction due to diabetes mellitus ([place]) 06/09/2019  ? Overweight with body mass index (BMI) of 28 to 28.9 in adult 03/17/2019  ? WPW (Wolff-Parkinson-White syndrome) 02/05/2019  ? SBP (spontaneous bacterial peritonitis) (HCC)-December 2020,    ? Encephalopathy, hepatic ([place])   ? Degenerative disc disease, cervical 07/21/2018  ? Cervical radiculopathy 07/16/2018  ? OAB (overactive bladder) 07/06/2018  ? Malignant neoplasm of prostate ([place]) 01/13/2018  ? Hyperlipidemia LDL goal <100 11/11/2017  ?  Essential hypertension 11/11/2017  ? BPH associated with nocturia 11/11/2017  ? Erectile dysfunction due to arterial insufficiency 04/07/2017  ? Peripheral vascular disease ([place]) 06/25/2016  ? Alcoholic cirrhosis ([place]) 70/35/0093  ? Type 2 diabetes mellitus with complication, with long-term current use of insulin ([place]) 08/22/2015  ? Hypersomnolence 03/19/2014  ? Peripheral neuropathy 01/11/2013  ? Polyclonal gammopathy 01/11/2013  ? GERD 10/04/2009  ? ? ?PCP: [name], MD ? ?REFERRING PROVIDER: Charlann Boxer ? ?REFERRING DIAG: Bil Knee pain/OA, lumbar pain ? ?THERAPY DIAG:  ?Other abnormalities of gait and mobility ? ?Muscle weakness (generalized) ? ?Acute pain of left knee ? ?Acute pain of right knee ? ?ONSET DATE: 1-2 yrs ago ? ?SUBJECTIVE:  ? ?SUBJECTIVE STATEMENT: ?Pt states feeling the best he has in "months" doing well with back after injection.    ? ? ?PERTINENT HISTORY: ? Chronic lymphocytic leukemia  ?Peripheral vascular disease   ?Alcoholic cirrhosis   ?Peripheral neuopathy ?DM ?OA ?  ? ? ?PAIN:  ?Are you having pain? Yes ?NPRS scale: 0-2/10 ?Pain location: Bil Knees.  ?Pain orientation: Right and Left  ?PAIN TYPE: aching ?Pain description: intermittent  ?Aggravating factors: Standing, activity, stairs. ?Relieving factors: none stated  ? ?PRECAUTIONS: None ? ?WEIGHT BEARING RESTRICTIONS No ? ?FALLS:  ?Has patient fallen in last 6 months? No, Number of falls: 0 ? ?OCCUPATION: Retired ? ?PLOF: Independent ? ?PATIENT GOALS  Play 9 holes of golf, no falls, be able to step up onto friends boat/yacht.  ? ? ?OBJECTIVE: ? ?COGNITION: ? Overall cognitive status: Within functional limits for tasks assessed   ?  ?SENSATION: ?Decreased sensation in feet, with some hypersensitivity in lower legs. ? ?MUSCLE LENGTH: ?HS: minimal limitation  ? ?PALPATION:  ? ?ROM ?05/06/21:  Lumbar: min limitation for flex and ext, Hips: mild limitation for rotation, Knees: WFL ? ?LE MMT: ?05/06/21:  ?Hips: 4/5,  L Knee: 4+/5, R: 5/5   ? ?LOWER EXTREMITY SPECIAL TESTS:  ? ? ? ?TODAY'S TREATMENT: ? ?05/13/21: ?Therapeutic Exercise: ?Aerobic:  L2 x 8 min;  ?Supine:  ?Seated:  ?Standing: Stairs up/down 6 in x 5/ recipricol; Step ups 6 in x 10 bil;  12 in x 5 bil;  Marching x 25;  ?Stretches:  Seated HS stretch 30 sec x 3 bil;  ?Neuromuscular Re-education:   Toe taps 6 in x 20 no UE support;  AirEx: A/P weight shifts x 20; AirEx: lateral reach x 10 bil;  fwd reach/squat x 10 bil;  Air Ex: step ups fwd/lat x20; marching x 20; Full golf swing x 10;  ? ? ?05/08/21: ?Therapeutic Exercise: ?Aerobic:  L2 x 8 min;  ?Supine:  ?Seated:  ?Standing: Stairs up/down 6 in x 10 bil; Marching x 15;  ?Stretches:  Seated HS stretch 30 sec x 3  bil;  ?Neuromuscular Re-education:   Toe taps 6 in x 20 no UE support;  AirEx: A/P weight shifts x 20; Air Ex: step ups fwd/lat x20; marching x 20; step downs x5 bil;  Tandem stance 30 sec;  ?With UE flex x 10 bil;  with head turns x 10 bil;  ?Manual Therapy: ? ? ?05/06/21: ?Therapeutic Exercise: ?Aerobic:  L1 x 8 min;  ?Supine:  ?Seated: Sit to stand x10;  ?Standing: Step ups 6 in x 10 bil;  ?Stretches:  Seated HS stretch 30 sec x 3 bil; Neuromuscular Re-education:  AirEx:  L/R weight shifts x 20;  L/R trunk rotation x 15 bil; with reach 5lb x 10 bil;  Toe taps 6 in x 20 no UE support;  quick walking with direction changes x 236f;  ?Manual Therapy:  ? ? ?PATIENT EDUCATION:  ?Education details:  reviewed HEP.  ?Person educated: Patient ?Education method: Explanation, Demonstration, Tactile cues, Verbal cues, and Handouts ?Education comprehension: verbalized understanding, returned demonstration, verbal cues required, tactile cues required, and needs further education ? ? ?HOME EXERCISE PROGRAM: ?Access Code: [email or web address]?Updated 04/08/21 ? ? ?ASSESSMENT: ? ?CLINICAL IMPRESSION: ?Pt making good progress with strength and balance. Showing improved ability for higher level activity on AirEx, as well as improved strength and stability on  stairs. He is able to perform full golf swing with much improved stability. Likely d/c next visit, will review final HEP.  ? ?OBJECTIVE IMPAIRMENTS Abnormal gait, decreased activity tolerance, decreased balance

## 2021-05-15 ENCOUNTER — Other Ambulatory Visit: Payer: Self-pay

## 2021-05-15 ENCOUNTER — Encounter: Payer: Medicare Other | Admitting: Physical Therapy

## 2021-05-15 ENCOUNTER — Other Ambulatory Visit: Payer: Self-pay | Admitting: Hematology and Oncology

## 2021-05-15 ENCOUNTER — Inpatient Hospital Stay (HOSPITAL_BASED_OUTPATIENT_CLINIC_OR_DEPARTMENT_OTHER): Payer: Medicare Other | Admitting: Hematology and Oncology

## 2021-05-15 ENCOUNTER — Inpatient Hospital Stay: Payer: Medicare Other | Attending: Hematology and Oncology

## 2021-05-15 VITALS — BP 139/74 | HR 102 | Temp 97.5°F | Resp 18 | Ht 74.0 in | Wt 241.3 lb

## 2021-05-15 DIAGNOSIS — K766 Portal hypertension: Secondary | ICD-10-CM | POA: Insufficient documentation

## 2021-05-15 DIAGNOSIS — E119 Type 2 diabetes mellitus without complications: Secondary | ICD-10-CM

## 2021-05-15 DIAGNOSIS — E669 Obesity, unspecified: Secondary | ICD-10-CM | POA: Diagnosis not present

## 2021-05-15 DIAGNOSIS — C9110 Chronic lymphocytic leukemia of B-cell type not having achieved remission: Secondary | ICD-10-CM

## 2021-05-15 DIAGNOSIS — Z79899 Other long term (current) drug therapy: Secondary | ICD-10-CM | POA: Diagnosis not present

## 2021-05-15 DIAGNOSIS — Z0000 Encounter for general adult medical examination without abnormal findings: Secondary | ICD-10-CM

## 2021-05-15 DIAGNOSIS — Z8546 Personal history of malignant neoplasm of prostate: Secondary | ICD-10-CM | POA: Diagnosis not present

## 2021-05-15 DIAGNOSIS — Z7984 Long term (current) use of oral hypoglycemic drugs: Secondary | ICD-10-CM | POA: Diagnosis not present

## 2021-05-15 DIAGNOSIS — M109 Gout, unspecified: Secondary | ICD-10-CM | POA: Insufficient documentation

## 2021-05-15 DIAGNOSIS — E1136 Type 2 diabetes mellitus with diabetic cataract: Secondary | ICD-10-CM | POA: Diagnosis not present

## 2021-05-15 DIAGNOSIS — D72820 Lymphocytosis (symptomatic): Secondary | ICD-10-CM | POA: Diagnosis not present

## 2021-05-15 DIAGNOSIS — G4730 Sleep apnea, unspecified: Secondary | ICD-10-CM | POA: Insufficient documentation

## 2021-05-15 DIAGNOSIS — K219 Gastro-esophageal reflux disease without esophagitis: Secondary | ICD-10-CM | POA: Diagnosis not present

## 2021-05-15 DIAGNOSIS — Z794 Long term (current) use of insulin: Secondary | ICD-10-CM | POA: Insufficient documentation

## 2021-05-15 DIAGNOSIS — D696 Thrombocytopenia, unspecified: Secondary | ICD-10-CM | POA: Insufficient documentation

## 2021-05-15 LAB — CBC WITH DIFFERENTIAL (CANCER CENTER ONLY)
Abs Immature Granulocytes: 0.04 10*3/uL (ref 0.00–0.07)
Basophils Absolute: 0 10*3/uL (ref 0.0–0.1)
Basophils Relative: 0 %
Eosinophils Absolute: 0.1 10*3/uL (ref 0.0–0.5)
Eosinophils Relative: 1 %
HCT: 48.3 % (ref 39.0–52.0)
Hemoglobin: 17.1 g/dL — ABNORMAL HIGH (ref 13.0–17.0)
Immature Granulocytes: 0 %
Lymphocytes Relative: 59 %
Lymphs Abs: 6 10*3/uL — ABNORMAL HIGH (ref 0.7–4.0)
MCH: 32.8 pg (ref 26.0–34.0)
MCHC: 35.4 g/dL (ref 30.0–36.0)
MCV: 92.5 fL (ref 80.0–100.0)
Monocytes Absolute: 0.5 10*3/uL (ref 0.1–1.0)
Monocytes Relative: 5 %
Neutro Abs: 3.7 10*3/uL (ref 1.7–7.7)
Neutrophils Relative %: 35 %
Platelet Count: 76 10*3/uL — ABNORMAL LOW (ref 150–400)
RBC: 5.22 MIL/uL (ref 4.22–5.81)
RDW: 13.8 % (ref 11.5–15.5)
Smear Review: NORMAL
WBC Count: 10.3 10*3/uL (ref 4.0–10.5)
nRBC: 0 % (ref 0.0–0.2)

## 2021-05-15 LAB — CMP (CANCER CENTER ONLY)
ALT: 18 U/L (ref 0–44)
AST: 22 U/L (ref 15–41)
Albumin: 4.3 g/dL (ref 3.5–5.0)
Alkaline Phosphatase: 57 U/L (ref 38–126)
Anion gap: 10 (ref 5–15)
BUN: 17 mg/dL (ref 8–23)
CO2: 23 mmol/L (ref 22–32)
Calcium: 8.9 mg/dL (ref 8.9–10.3)
Chloride: 108 mmol/L (ref 98–111)
Creatinine: 0.74 mg/dL (ref 0.61–1.24)
GFR, Estimated: >60 mL/min (ref >60)
Glucose, Bld: 114 mg/dL — ABNORMAL HIGH (ref 70–99)
Potassium: 4.1 mmol/L (ref 3.5–5.1)
Sodium: 141 mmol/L (ref 135–145)
Total Bilirubin: 1.1 mg/dL (ref 0.3–1.2)
Total Protein: 7.3 g/dL (ref 6.5–8.1)

## 2021-05-15 LAB — HEMOGLOBIN A1C
Hgb A1c MFr Bld: 6.8 % — ABNORMAL HIGH (ref 4.8–5.6)
Mean Plasma Glucose: 148.46 mg/dL

## 2021-05-15 LAB — PSA: PSA: 0.2

## 2021-05-15 LAB — LACTATE DEHYDROGENASE: LDH: 151 U/L (ref 98–192)

## 2021-05-15 NOTE — Progress Notes (Signed)
?Patrick Kidd ?[phone]   [phone] ? ?PROGRESS NOTE ? ?Patient Care Team: ?Patrick Lima, MD as PCP - General (Internal Medicine) ?Patrick Harp, MD as PCP - Cardiology (Cardiology) ?Patrick Partridge, DO as Consulting Physician (Neurology) ?Patrick Kidd, [facility] (Pharmacist) ? ?Hematological/Oncological History ?# Chronic Lymphocytic Leukemia (CLL) Rai Stage 0 ?10/04/2020: establish care with Patrick. Lorenso Kidd. WBC 9.7, ALC 5100. 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 ?Interval History:  ?Patrick Kidd 76 y.o. male with medical history significant for CLL who presents for a follow up visit. The patient's last visit was on 11/08/2020. In the interim since the last visit he has been in his normal state of health.  ? ?On exam today Patrick Kidd reports he has been well in the interim since our last visit.  He is undergoing physical therapy for his legs which is helping with his leg strength.  He notes his energy levels are good.  He does still occasionally get some episodes of fatigue. He denies having episodes of bleeding does report he "bruises a lot".  He has lost 9 pounds intentionally since her last visit by dropping down to 2 meals per day.  He denies any overt signs of lymphadenopathy.  He notes he is not having any issues with fevers, chills, sweats, nausea, vomiting or diarrhea.  He denies any lymphadenopathy.  Full 10 point ROS is listed below. ? ?MEDICAL HISTORY:  ?Past Medical History:  ?Diagnosis Date  ? Acrophobia   ? Alcoholic cirrhosis ([place]) 05/05/270  ? Anemia   ? Arthritis   ? foot by big toe  ? BPH associated with nocturia   ? Cataract   ? removed both eyes  ? Chronic cough   ? PMH of  ? COVID-19 12/2018  ? Diabetes mellitus without complication ([place])   ? Diverticulosis 07-03-2010  ? Colonoscopy.   ? Duodenal ulcer 2017  ? Fallen arches   ? Bilateral  ? GERD (gastroesophageal reflux disease)    ? Gout   ? Granuloma annulare   ? Hx of adenomatous colonic polyps multiple  ? Hydrocele 2011  ? Large septated right hydrocele  ? Liver cyst   ? Liver lesion   ? Nonspecific elevation of levels of transaminase or lactic acid dehydrogenase (LDH)   ? Obesity   ? Peripheral neuropathy   ? Plantar fasciitis   ? PMH of  ? Portal hypertension ([place]) 2017  ? Prostate cancer ([place])   ? Right shoulder pain 11/2017  ? Sleep apnea   ? no cpap, patient denies  ? Thiamine deficiency   ? Wears reading eyeglasses   ? WPW (Wolff-Parkinson-White syndrome) 02/05/2019  ? ? ?SURGICAL HISTORY: ?Past Surgical History:  ?Procedure Laterality Date  ? CATARACT EXTRACTION, BILATERAL  12/2011   ? Patrick Kidd  ? COLONOSCOPY  2017  ? COLONOSCOPY W/ POLYPECTOMY  07/03/2010  ? 2 adenomas, diverticulosis on right. Patrick Kidd  ? CYSTOSCOPY N/A 03/15/2018  ? Procedure: CYSTOSCOPY FLEXIBLE;  Surgeon: Patrick Mallow, MD;  [facility];  Service: Urology;  Laterality: N/A;  NO SEEDS FOUND IN BLADDER  ? ESOPHAGOGASTRODUODENOSCOPY (EGD) WITH PROPOFOL N/A 01/08/2016  ? Procedure: ESOPHAGOGASTRODUODENOSCOPY (EGD) WITH PROPOFOL;  Surgeon: Patrick Artist, MD;  Location: [facility];  Service: Endoscopy;  Laterality: N/A;  ? Patrick Kidd  ? RADIOACTIVE SEED IMPLANT N/A 03/15/2018  ? Procedure: RADIOACTIVE SEED IMPLANT/BRACHYTHERAPY IMPLANT;  Surgeon:  Patrick Mallow, MD;  Location: [facility];  Service: Urology;  Laterality: N/A;   73 SEEDS IMPLANTED  ? SIGMOIDOSCOPY    ? SPACE OAR INSTILLATION N/A 03/15/2018  ? Procedure: SPACE OAR INSTILLATION;  Surgeon: Patrick Mallow, MD;  Location[facility];  Service: Urology;  Laterality: N/A;  ? WISDOM TOOTH EXTRACTION    ? ? ?SOCIAL HISTORY: ?Social History  ? ?Socioeconomic History  ? Marital status: Married  ?  Spouse name: Patrick Kidd  ? Number of children: 2  ? Years of education: Not on file  ? Highest education level: Some college, no  degree  ?Occupational History  ? Occupation: Adult nurse estate  ?Tobacco Use  ? Smoking status: Former  ?  Packs/day: 2.00  ?  Years: 6.00  ?  Pack years: 12.00  ?  Types: Cigarettes  ?  Quit date: 02/04/1971  ?  Years since quitting: 50.3  ? Smokeless tobacco: Never  ? Tobacco comments:  ?  smoked age [age], up to 2 ppd  ?Vaping Use  ? Vaping Use: Never used  ?Substance and Sexual Activity  ? Alcohol use: Not Currently  ?  Alcohol/week: 28.0 standard drinks  ?  Types: 28 Shots of liquor per week  ?  Comment: No alochol since Christmas Day 2020  ? Drug use: No  ? Sexual activity: Yes  ?  Partners: Female  ?Other Topics Concern  ? Not on file  ?Social History Narrative  ? Worked in Adult nurse estate  ? 2 children  ? Former smoker no drug use  ? Alcoholism, abstinent since 01/23/2019 most recently  ? Fun/Hobby: Play golf, grandchildren, [employer]  ?   ? Patient is left-handed. He lives with his wife in a one level home. He swims most days.  ? ?Social Determinants of Health  ? ?Financial Resource Strain: Not on file  ?Food Insecurity: Not on file  ?Transportation Needs: Not on file  ?Physical Activity: Not on file  ?Stress: Not on file  ?Social Connections: Not on file  ?Intimate Partner Violence: Not on file  ? ? ?FAMILY HISTORY: ?Family History  ?Problem Relation Age of Onset  ? Lung cancer Mother   ?     smoker  ? Leukemia Father   ?     Acute myelocytic  ? Diabetes Neg Hx   ? Stroke Neg Hx   ? Heart disease Neg Hx   ? Colon cancer Neg Hx   ? Colon polyps Neg Hx   ? Esophageal cancer Neg Hx   ? Rectal cancer Neg Hx   ? Stomach cancer Neg Hx   ? ? ?ALLERGIES:  is allergic to rifaximin, beta adrenergic blockers, and calcium channel blockers. ? ?MEDICATIONS:  ?Current Outpatient Medications  ?Medication Sig Dispense Refill  ? blood glucose meter kit and supplies KIT Use to test blood sugar once daily. DX E11.8 1 each 0  ? Colchicine (MITIGARE) 0.6 MG CAPS Take 1 tablet by mouth daily. 90 capsule 0  ? FARXIGA 10 MG  TABS tablet Take 1 tablet (10 mg total) by mouth daily before breakfast. 90 tablet 0  ? folic acid (FOLVITE) 1 MG tablet TAKE 1 TABLET BY MOUTH EVERY DAY 90 tablet 1  ? gabapentin (NEURONTIN) 300 MG capsule Take 1 capsule (300 mg total) by mouth 3 (three) times daily. 270 capsule 3  ? glucose blood (COOL BLOOD GLUCOSE TEST STRIPS) test strip Use to check blood sugar daily. DX: E11.8  300 each 3  ? insulin glargine, 2 Unit Dial, (TOUJEO MAX SOLOSTAR) 300 UNIT/ML Solostar Pen Inject 30 Units into the skin daily. Via [facility] pt assistance (Patient taking differently: Inject 50 Units into the skin daily. Via [facility] pt assistance) 9 mL 1  ? Lancets (ACCU-CHEK MULTICLIX) lancets Use to check sugar daily. DX: E11.8 100 each 12  ? metFORMIN (GLUCOPHAGE) 500 MG tablet TAKE 1 TABLET BY MOUTH 2 TIMES DAILY WITH A MEAL. 180 tablet 0  ? Multiple Vitamin (MULTIVITAMIN WITH MINERALS) TABS tablet Take 1 tablet by mouth daily.    ? pantoprazole (PROTONIX) 40 MG tablet TAKE 1 TABLET 30 MINUTES BEFORE MEALS (BREAKFAST AND SUPPER) TWICE A DAY 180 tablet 0  ? tadalafil (CIALIS) 20 MG tablet Take 1 tablet (20 mg total) by mouth daily as needed for erectile dysfunction. 6 tablet 3  ? vitamin B-12 (CYANOCOBALAMIN) 1000 MCG tablet Take 1,000 mcg by mouth daily.    ? ?No current facility-administered medications for this visit.  ? ? ?REVIEW OF SYSTEMS:   ?Constitutional: ( - ) fevers, ( - )  chills , ( - ) night sweats ?Eyes: ( - ) blurriness of vision, ( - ) double vision, ( - ) watery eyes ?Ears, nose, mouth, throat, and face: ( - ) mucositis, ( - ) sore throat ?Respiratory: ( - ) cough, ( - ) dyspnea, ( - ) wheezes ?Cardiovascular: ( - ) palpitation, ( - ) chest discomfort, ( - ) lower extremity swelling ?Gastrointestinal:  ( - ) nausea, ( - ) heartburn, ( - ) change in bowel habits ?Skin: ( - ) abnormal skin rashes ?Lymphatics: ( - ) new lymphadenopathy, ( - ) easy bruising ?Neurological: ( - ) numbness, ( - ) tingling, ( - ) new  weaknesses ?Behavioral/Psych: ( - ) mood change, ( - ) new changes  ?All other systems were reviewed with the patient and are negative. ? ?PHYSICAL EXAMINATION: ?ECOG PERFORMANCE STATUS: 1 - Symptomatic but comple

## 2021-05-16 ENCOUNTER — Telehealth: Payer: Self-pay

## 2021-05-16 ENCOUNTER — Telehealth: Payer: Self-pay | Admitting: Hematology and Oncology

## 2021-05-16 LAB — HM DIABETES EYE EXAM

## 2021-05-16 LAB — PROSTATE-SPECIFIC AG, SERUM (LABCORP): Prostate Specific Ag, Serum: 0.2 ng/mL (ref 0.0–4.0)

## 2021-05-16 NOTE — Telephone Encounter (Signed)
Pt reports A1C has dropped to 6.8 and wants to know if he should be still taking 50 units of insulin? ? ?Please advise ?

## 2021-05-16 NOTE — Telephone Encounter (Signed)
Called pt and scheduled for 4/18. TY

## 2021-05-16 NOTE — Telephone Encounter (Signed)
Scheduled per 4/12 los, message has been left with pt ?

## 2021-05-20 ENCOUNTER — Ambulatory Visit (INDEPENDENT_AMBULATORY_CARE_PROVIDER_SITE_OTHER): Payer: Medicare Other | Admitting: Physical Therapy

## 2021-05-20 ENCOUNTER — Encounter: Payer: Self-pay | Admitting: Physical Therapy

## 2021-05-20 DIAGNOSIS — M6281 Muscle weakness (generalized): Secondary | ICD-10-CM

## 2021-05-20 DIAGNOSIS — R2689 Other abnormalities of gait and mobility: Secondary | ICD-10-CM | POA: Diagnosis not present

## 2021-05-20 NOTE — Therapy (Signed)
?OUTPATIENT PHYSICAL THERAPY TREATMENT and Discharge Note ? ? ?Patient Name: Patrick Kidd ?MRN: [id number] ?DOB:1945-12-13, 76 y.o., male ?Today's Date: 05/13/2021 ? ?PHYSICAL THERAPY DISCHARGE SUMMARY ? ?Visits from Start of Care: 13 ? ?Current functional level related to goals / functional outcomes: ?See below ?  ?Remaining deficits: ?See below ?  ?Education / Equipment: ?See below  ? ?Patient agrees to discharge. Patient goals were partially met. Patient is being discharged due to being pleased with the current functional level. ? ? ? ? PT End of Session - 05/20/21 1516   ? ? Visit Number 13   ? Number of Visits 16   ? Date for PT Re-Evaluation 05/29/21   ? Authorization Type Medicare  PN done at visit 10.   ? PT Start Time 6767   ? PT Stop Time 1556   ? PT Time Calculation (min) 40 min   ? Equipment Utilized During Treatment Gait belt   ? Activity Tolerance Patient tolerated treatment well   ? Behavior During Therapy [facility] for tasks assessed/performed   ? ?  ?  ? ?  ? ? ? ? ? ? ? ? ? ? ?Past Medical History:  ?Diagnosis Date  ? Acrophobia   ? Alcoholic cirrhosis ([place]) 03/15/4707  ? Anemia   ? Arthritis   ? foot by big toe  ? BPH associated with nocturia   ? Cataract   ? removed both eyes  ? Chronic cough   ? PMH of  ? COVID-19 12/2018  ? Diabetes mellitus without complication ([place])   ? Diverticulosis 07-03-2010  ? Colonoscopy.   ? Duodenal ulcer 2017  ? Fallen arches   ? Bilateral  ? GERD (gastroesophageal reflux disease)   ? Gout   ? Granuloma annulare   ? Hx of adenomatous colonic polyps multiple  ? Hydrocele 2011  ? Large septated right hydrocele  ? Liver cyst   ? Liver lesion   ? Nonspecific elevation of levels of transaminase or lactic acid dehydrogenase (LDH)   ? Obesity   ? Peripheral neuropathy   ? Plantar fasciitis   ? PMH of  ? Portal hypertension ([place]) 2017  ? Prostate cancer ([place])   ? Right shoulder pain 11/2017  ? Sleep apnea   ? no cpap, patient denies  ? Thiamine deficiency   ? Wears reading  eyeglasses   ? WPW (Wolff-Parkinson-White syndrome) 02/05/2019  ? ?Past Surgical History:  ?Procedure Laterality Date  ? CATARACT EXTRACTION, BILATERAL  12/2011   ? Dr Gershon Crane  ? COLONOSCOPY  2017  ? COLONOSCOPY W/ POLYPECTOMY  07/03/2010  ? 2 adenomas, diverticulosis on right. Dr [name]  ? CYSTOSCOPY N/A 03/15/2018  ? Procedure: CYSTOSCOPY FLEXIBLE;  Surgeon: [name], MD;  [facility];  Service: Urology;  Laterality: N/A;  NO SEEDS FOUND IN BLADDER  ? ESOPHAGOGASTRODUODENOSCOPY (EGD) WITH PROPOFOL N/A 01/08/2016  ? Procedure: ESOPHAGOGASTRODUODENOSCOPY (EGD) WITH PROPOFOL;  Surgeon: [name], MD;  [facility];  Service: Endoscopy;  Laterality: N/A;  ? [place]  ? RADIOACTIVE SEED IMPLANT N/A 03/15/2018  ? Procedure: RADIOACTIVE SEED IMPLANT/BRACHYTHERAPY IMPLANT;  Surgeon: [name], MD;  Location: [facility];  Service: Urology;  Laterality: N/A;   73 SEEDS IMPLANTED  ? SIGMOIDOSCOPY    ? SPACE OAR INSTILLATION N/A 03/15/2018  ? Procedure: SPACE OAR INSTILLATION;  Surgeon: [name], MD;  Location: [facility];  Service: Urology;  Laterality: N/A;  ?  WISDOM TOOTH EXTRACTION    ? ?Patient Active Problem List  ? Diagnosis Date Noted  ? Degenerative lumbar spinal stenosis 04/30/2021  ? Degenerative arthritis of knee, bilateral 03/27/2021  ? Right leg pain 12/21/2020  ? Chronic lymphocytic leukemia ([place]) 11/08/2020  ? Atypical lymphocytes present on peripheral blood smear 09/26/2020  ? Insulin-requiring or dependent type II diabetes mellitus ([place]) 09/25/2020  ? Primary stabbing headache 03/15/2020  ? Stage 3a chronic kidney disease ([place]) 03/15/2020  ? Nocturnal headaches 03/14/2020  ? Thiamine deficiency   ? Paroxysmal atrial fibrillation ([place]) 07/19/2019  ? Allergic rhinitis 06/09/2019  ? Erectile dysfunction due to diabetes mellitus ([place]) 06/09/2019  ? Overweight with body mass index (BMI) of 28 to  28.9 in adult 03/17/2019  ? WPW (Wolff-Parkinson-White syndrome) 02/05/2019  ? SBP (spontaneous bacterial peritonitis) (HCC)-December 2020,    ? Encephalopathy, hepatic ([place])   ? Degenerative disc disease, cervical 07/21/2018  ? Cervical radiculopathy 07/16/2018  ? OAB (overactive bladder) 07/06/2018  ? Malignant neoplasm of prostate ([place]) 01/13/2018  ? Hyperlipidemia LDL goal <100 11/11/2017  ? Essential hypertension 11/11/2017  ? BPH associated with nocturia 11/11/2017  ? Erectile dysfunction due to arterial insufficiency 04/07/2017  ? Peripheral vascular disease ([place]) 06/25/2016  ? Alcoholic cirrhosis ([place]) 97/98/9211  ? Type 2 diabetes mellitus with complication, with long-term current use of insulin ([place]) 08/22/2015  ? Hypersomnolence 03/19/2014  ? Peripheral neuropathy 01/11/2013  ? Polyclonal gammopathy 01/11/2013  ? GERD 10/04/2009  ? ? ?PCP: [name], MD ? ?REFERRING PROVIDER: Charlann Boxer ? ?REFERRING DIAG: Bil Knee pain/OA, lumbar pain ? ?THERAPY DIAG:  ?Other abnormalities of gait and mobility ? ?Muscle weakness (generalized) ? ?ONSET DATE: 1-2 yrs ago ? ?SUBJECTIVE:  ? ?SUBJECTIVE STATEMENT: ?States he feels 80% better and had pain but did his exercises and his pain went away.   ? ? ?PERTINENT HISTORY: ? Chronic lymphocytic leukemia  ?Peripheral vascular disease   ?Alcoholic cirrhosis   ?Peripheral neuopathy ?DM ?OA ?  ? ? ?PAIN:  ?Are you having pain? Yes ?NPRS scale: 0-2/10 ?Pain location: Bil Knees.  ?Pain orientation: Right and Left  ?PAIN TYPE: aching ?Pain description: intermittent  ?Aggravating factors: Standing, activity, stairs. ?Relieving factors: none stated  ? ?PRECAUTIONS: None ? ?WEIGHT BEARING RESTRICTIONS No ? ?FALLS:  ?Has patient fallen in last 6 months? No, Number of falls: 0 ? ?OCCUPATION: Retired ? ?PLOF: Independent ? ?PATIENT GOALS  Play 9 holes of golf, no falls, be able to step up onto friends boat/yacht.  ? ? ?OBJECTIVE: ? ?COGNITION: ? Overall cognitive status: Within  functional limits for tasks assessed   ?  ?SENSATION: ?Decreased sensation in feet, with some hypersensitivity in lower legs. ? ?MUSCLE LENGTH: ?HS: minimal limitation  ? ?PALPATION:  ? ?ROM ?05/06/21:  Lumbar: min limitation for flex and ext, Hips: mild limitation for rotation, Knees: WFL ? ?LE MMT: ?05/06/21:  ?Hips: 4/5,  L Knee: 4+/5, R: 5/5  ? ?LOWER EXTREMITY SPECIAL TESTS:  ? ? ? ?TODAY'S TREATMENT: ? ?05/20/2021 ?Therapeutic Exercise: ?Aerobic:    ?Supine:  ?Seated: [id number] 10" holds B, STS slow and controlled x5  ?Standing:  ?Stairs up/down 6 in x 5/ recipricol; Step ups 6 in x 10 bil;  12 in x 5 bil;  Marching x 25;  ?Stretches:  Seated HS stretch 30 sec x 3 bil; lumbar rotation seated 2 minutes, seated lumbar side bending 2 minutes ?Neuromuscular Re-education:  step taps on 6" step x3 bilateral 1 minute each, staggered step balance x3  60" holds each ? ? ?05/08/21: ?Therapeutic Exercise: ?Aerobic:  L2 x 8 min;  ?Supine:  ?Seated:  ?Standing: Stairs up/down 6 in x 10 bil; Marching x 15;,  ?Stretches:  Seated HS stretch 30 sec x 3 bil;  ?Neuromuscular Re-education:   Toe taps 6 in x 20 no UE support;   Tandem stance 30 sec;  ?With UE flex x 10 bil;  with head turns x 10 bil; step balance x3 60" holds bilateral  ?Manual Therapy: ? ? ?05/06/21: ?Therapeutic Exercise: ?Aerobic:  L1 x 8 min;  ?Supine:  ?Seated: Sit to stand x10;  ?Standing: Step ups 6 in x 10 bil;  ?Stretches:  Seated HS stretch 30 sec x 3 bil; Neuromuscular Re-education:  AirEx:  L/R weight shifts x 20;  L/R trunk rotation x 15 bil; with reach 5lb x 10 bil;  Toe taps 6 in x 20 no UE support;  quick walking with direction changes x 239f;  ?Manual Therapy:  ? ? ?PATIENT EDUCATION:  ?Education details:  reviewed HEP.  ?Person educated: Patient ?Education method: Explanation, Demonstration, Tactile cues, Verbal cues, and Handouts ?Education comprehension: verbalized understanding, returned demonstration, verbal cues required, tactile cues required, and  needs further education ? ? ?HOME EXERCISE PROGRAM: ?Access Code: [email or web address]?Updated 04/08/21 ? ? ?ASSESSMENT: ? ?CLINICAL IMPRESSION: ?Last session. Focused on review of HEP and advancing exercises as tolerated. Answ

## 2021-05-21 ENCOUNTER — Telehealth: Payer: Self-pay

## 2021-05-21 ENCOUNTER — Ambulatory Visit (INDEPENDENT_AMBULATORY_CARE_PROVIDER_SITE_OTHER): Payer: Medicare Other | Admitting: Internal Medicine

## 2021-05-21 ENCOUNTER — Encounter: Payer: Self-pay | Admitting: Internal Medicine

## 2021-05-21 VITALS — BP 138/74 | HR 90 | Temp 98.0°F | Resp 16 | Ht 74.0 in | Wt 240.0 lb

## 2021-05-21 DIAGNOSIS — E785 Hyperlipidemia, unspecified: Secondary | ICD-10-CM

## 2021-05-21 DIAGNOSIS — I10 Essential (primary) hypertension: Secondary | ICD-10-CM

## 2021-05-21 DIAGNOSIS — E118 Type 2 diabetes mellitus with unspecified complications: Secondary | ICD-10-CM | POA: Diagnosis not present

## 2021-05-21 DIAGNOSIS — Z794 Long term (current) use of insulin: Secondary | ICD-10-CM

## 2021-05-21 DIAGNOSIS — C61 Malignant neoplasm of prostate: Secondary | ICD-10-CM

## 2021-05-21 DIAGNOSIS — N401 Enlarged prostate with lower urinary tract symptoms: Secondary | ICD-10-CM

## 2021-05-21 DIAGNOSIS — Z23 Encounter for immunization: Secondary | ICD-10-CM

## 2021-05-21 LAB — LIPID PANEL
Cholesterol: 162 mg/dL (ref 0–200)
HDL: 76.8 mg/dL (ref >39.00)
LDL Cholesterol: 61 mg/dL (ref 0–99)
NonHDL: 84.99
Total CHOL/HDL Ratio: 2
Triglycerides: 122 mg/dL (ref 0.0–149.0)
VLDL: 24.4 mg/dL (ref 0.0–40.0)

## 2021-05-21 MED ORDER — FARXIGA 10 MG PO TABS: 10.0000 mg | ORAL_TABLET | Freq: Every day | ORAL | 1 refills | Status: DC

## 2021-05-21 MED ORDER — TOUJEO MAX SOLOSTAR 300 UNIT/ML ~~LOC~~ SOPN: 50.0000 [IU] | PEN_INJECTOR | Freq: Every day | SUBCUTANEOUS | 1 refills | Status: DC

## 2021-05-21 MED ORDER — METFORMIN HCL 500 MG PO TABS: 500.0000 mg | ORAL_TABLET | Freq: Two times a day (BID) | ORAL | 1 refills | Status: DC

## 2021-05-21 NOTE — Progress Notes (Signed)
? ?Subjective:  ?Patient ID: Patrick Kidd, male    DOB: 09-Sep-1945  Age: 76 y.o. MRN: [id number] ? ?CC: Diabetes and Hyperlipidemia ? ? ?HPI ?Jomes Giraldo Terrones presents for f/up - ? ?He is active and denies chest pain, shortness of breath, diaphoresis, dizziness, lightheadedness, or edema. ? ? ?Outpatient Medications Prior to Visit  ?Medication Sig Dispense Refill  ? blood glucose meter kit and supplies KIT Use to test blood sugar once daily. DX E11.8 1 each 0  ? Colchicine (MITIGARE) 0.6 MG CAPS Take 1 tablet by mouth daily. 90 capsule 0  ? folic acid (FOLVITE) 1 MG tablet TAKE 1 TABLET BY MOUTH EVERY DAY 90 tablet 1  ? gabapentin (NEURONTIN) 300 MG capsule Take 1 capsule (300 mg total) by mouth 3 (three) times daily. 270 capsule 3  ? glucose blood (COOL BLOOD GLUCOSE TEST STRIPS) test strip Use to check blood sugar daily. DX: E11.8 300 each 3  ? Lancets (ACCU-CHEK MULTICLIX) lancets Use to check sugar daily. DX: E11.8 100 each 12  ? pantoprazole (PROTONIX) 40 MG tablet TAKE 1 TABLET 30 MINUTES BEFORE MEALS (BREAKFAST AND SUPPER) TWICE A DAY 180 tablet 0  ? tadalafil (CIALIS) 20 MG tablet Take 1 tablet (20 mg total) by mouth daily as needed for erectile dysfunction. 6 tablet 3  ? vitamin B-12 (CYANOCOBALAMIN) 1000 MCG tablet Take 1,000 mcg by mouth daily.    ? FARXIGA 10 MG TABS tablet Take 1 tablet (10 mg total) by mouth daily before breakfast. 90 tablet 0  ? insulin glargine, 2 Unit Dial, (TOUJEO MAX SOLOSTAR) 300 UNIT/ML Solostar Pen Inject 30 Units into the skin daily. Via [facility] pt assistance (Patient taking differently: Inject 50 Units into the skin daily. Via [facility] pt assistance) 9 mL 1  ? metFORMIN (GLUCOPHAGE) 500 MG tablet TAKE 1 TABLET BY MOUTH 2 TIMES DAILY WITH A MEAL. 180 tablet 0  ? Multiple Vitamin (MULTIVITAMIN WITH MINERALS) TABS tablet Take 1 tablet by mouth daily.    ? ?No facility-administered medications prior to visit.  ? ? ?ROS ?Review of Systems  ?Constitutional:  Negative for  chills, diaphoresis, fatigue and fever.  ?HENT: Negative.    ?Eyes: Negative.   ?Respiratory:  Negative for cough, chest tightness, shortness of breath and wheezing.   ?Cardiovascular:  Negative for chest pain, palpitations and leg swelling.  ?Gastrointestinal:  Negative for abdominal pain, constipation, diarrhea, nausea and vomiting.  ?Endocrine: Negative.   ?Musculoskeletal:  Negative for arthralgias and myalgias.  ?Skin: Negative.   ?Neurological:  Negative for dizziness, weakness, light-headedness and headaches.  ?Hematological:  Negative for adenopathy. Does not bruise/bleed easily.  ?Psychiatric/Behavioral: Negative.    ? ?Objective:  ?BP 138/74 (BP Location: Left Arm, Patient Position: Sitting, Cuff Size: Large)   Pulse 90   Temp 98 ?F (36.7 ?C) (Oral)   Resp 16   Ht 6' 2"  (1.88 m)   Wt 240 lb (108.9 kg)   SpO2 96%   BMI 30.81 kg/m?  ? ?BP Readings from Last 3 Encounters:  ?05/21/21 138/74  ?05/15/21 139/74  ?05/03/21 (!) 147/79  ? ? ?Wt Readings from Last 3 Encounters:  ?05/21/21 240 lb (108.9 kg)  ?05/15/21 241 lb 4.8 oz (109.5 kg)  ?04/30/21 246 lb (111.6 kg)  ? ? ?Physical Exam ?Vitals reviewed.  ?HENT:  ?   Nose: Nose normal.  ?   Mouth/Throat:  ?   Mouth: Mucous membranes are moist.  ?Eyes:  ?   General: No scleral icterus. ?  Conjunctiva/sclera: Conjunctivae normal.  ?Cardiovascular:  ?   Rate and Rhythm: Normal rate and regular rhythm.  ?   Heart sounds: No murmur heard. ?Pulmonary:  ?   Effort: Pulmonary effort is normal.  ?   Breath sounds: No stridor. No wheezing, rhonchi or rales.  ?Abdominal:  ?   General: Abdomen is flat.  ?   Palpations: There is no mass.  ?   Tenderness: There is no abdominal tenderness. There is no guarding.  ?   Hernia: No hernia is present.  ?Musculoskeletal:     ?   General: Normal range of motion.  ?   Cervical back: Neck supple.  ?   Right lower leg: No edema.  ?   Left lower leg: No edema.  ?Lymphadenopathy:  ?   Cervical: No cervical adenopathy.  ?Skin: ?    General: Skin is warm and dry.  ?Neurological:  ?   General: No focal deficit present.  ?   Mental Status: He is alert.  ?Psychiatric:     ?   Mood and Affect: Mood normal.     ?   Behavior: Behavior normal.  ? ? ?Lab Results  ?Component Value Date  ? WBC 10.3 05/15/2021  ? HGB 17.1 (H) 05/15/2021  ? HCT 48.3 05/15/2021  ? PLT 76 (L) 05/15/2021  ? GLUCOSE 114 (H) 05/15/2021  ? CHOL 162 05/21/2021  ? TRIG 122.0 05/21/2021  ? HDL 76.80 05/21/2021  ? [place] 61 05/21/2021  ? ALT 18 05/15/2021  ? AST 22 05/15/2021  ? NA 141 05/15/2021  ? K 4.1 05/15/2021  ? CL 108 05/15/2021  ? CREATININE 0.74 05/15/2021  ? BUN 17 05/15/2021  ? CO2 23 05/15/2021  ? TSH 1.92 03/14/2020  ? PSA 0.2 05/15/2021  ? INR 1.1 (H) 03/14/2020  ? HGBA1C 6.8 (H) 05/15/2021  ? MICROALBUR <0.7 09/25/2020  ? ? ?DG INJECT DIAG/THERA/INC NEEDLE/CATH/PLC EPI/LUMB/SAC W/IMG ? ?Result Date: 05/03/2021 ?CLINICAL DATA:  Lumbosacral spondylosis without myelopathy. Right low back pain. Gait disturbance. FLUOROSCOPY: Radiation Exposure Index (as provided by the fluoroscopic device): 2.2 mGy Kerma PROCEDURE: The procedure, risks, benefits, and alternatives were explained to the patient. Questions regarding the procedure were encouraged and answered. The patient understands and consents to the procedure. LUMBAR EPIDURAL INJECTION: An interlaminar approach was performed on the right at L4-5. The overlying skin was cleansed and anesthetized. A 3.5 inch 20 gauge epidural needle was advanced using loss-of-resistance technique. DIAGNOSTIC EPIDURAL INJECTION: Injection of Isovue-M 200 shows a good epidural pattern with spread above and below the level of needle placement, primarily on the right. No vascular opacification is seen. THERAPEUTIC EPIDURAL INJECTION: 80 mg of Depo-Medrol mixed with 3 mL of 1% lidocaine were instilled. The procedure was well-tolerated, and the patient was discharged thirty minutes following the injection in good condition. COMPLICATIONS: None  immediate IMPRESSION: Technically successful interlaminar epidural injection on the right at L4-5. Electronically Signed   By: Logan Bores M.D.   On: 05/03/2021 11:37  ? ? ?Assessment & Plan:  ? ?Tamarius was seen today for diabetes and hyperlipidemia. ? ?Diagnoses and all orders for this visit: ? ?Essential hypertension- His blood pressure is adequately well controlled. ? ?Hyperlipidemia LDL goal <100- I have asked him to take a statin for cardiovascular risk reduction. ?-     Lipid panel; Future ?-     Lipid panel ?-     atorvastatin (LIPITOR) 10 MG tablet; Take 1 tablet (10 mg total) by mouth daily. ? ?BPH  associated with nocturia ?-     Cancel: PSA; Future ? ?Malignant neoplasm of prostate ([place])- No evidence of recurrence. ? ?Type 2 diabetes mellitus with complication, with long-term current use of insulin ([place])- His blood sugar is adequately well controlled.  Will continue the current regimen. ?-     insulin glargine, 2 Unit Dial, (TOUJEO MAX SOLOSTAR) 300 UNIT/ML Solostar Pen; Inject 50 Units into the skin daily. Via [facility] pt assistance ?-     metFORMIN (GLUCOPHAGE) 500 MG tablet; Take 1 tablet (500 mg total) by mouth 2 (two) times daily with a meal. ?-     Discontinue: FARXIGA 10 MG TABS tablet; Take 1 tablet (10 mg total) by mouth daily before breakfast. ?-     FARXIGA 10 MG TABS tablet; Take 1 tablet (10 mg total) by mouth daily before breakfast. ? ? ?I have discontinued Hollice Espy B. Ey "Bryan"'s multivitamin with minerals. I have also changed his Toujeo Max SoloStar and metFORMIN. Additionally, I am having him start on atorvastatin and Shingrix. Lastly, I am having him maintain his blood glucose meter kit and supplies, accu-chek multiclix, Cool Blood Glucose Test Strips, Colchicine, gabapentin, folic acid, pantoprazole, vitamin B-12, tadalafil, and Farxiga. ? ?Meds ordered this encounter  ?Medications  ? insulin glargine, 2 Unit Dial, (TOUJEO MAX SOLOSTAR) 300 UNIT/ML Solostar Pen  ?  Sig: Inject 50  Units into the skin daily. Via [facility] pt assistance  ?  Dispense:  15 mL  ?  Refill:  1  ? metFORMIN (GLUCOPHAGE) 500 MG tablet  ?  Sig: Take 1 tablet (500 mg total) by mouth 2 (two) times daily with a meal.  ?  Dispen

## 2021-05-21 NOTE — Patient Instructions (Signed)

## 2021-05-21 NOTE — Telephone Encounter (Signed)
Patient in need of refill of farxiga through [place] and [place] program ? ?Please send refill for farxiga to [facility] - patient autoenrolled in PAP program for [place] for year of 2023 ? ?Thanks,  ?Dan  ?

## 2021-05-25 DIAGNOSIS — U071 COVID-19: Secondary | ICD-10-CM | POA: Diagnosis not present

## 2021-05-27 MED ORDER — SHINGRIX 50 MCG/0.5ML IM SUSR: 0.5000 mL | Freq: Once | INTRAMUSCULAR | 1 refills | Status: AC

## 2021-05-27 MED ORDER — ATORVASTATIN CALCIUM 10 MG PO TABS: 10.0000 mg | ORAL_TABLET | Freq: Every day | ORAL | 1 refills | Status: DC

## 2021-06-02 IMAGING — US US ABDOMEN LIMITED
1 series · 4 of 4 positions shown · non-contrast
Comparison: January 31, 2019.

CLINICAL DATA: Ascites.

EXAM:
LIMITED ABDOMEN ULTRASOUND FOR ASCITES
TECHNIQUE: Limited ultrasound survey for ascites was performed in all four
abdominal quadrants.

[Series 1: us abdomen limited · 4 of 4 slices shown]
[im 1/4]
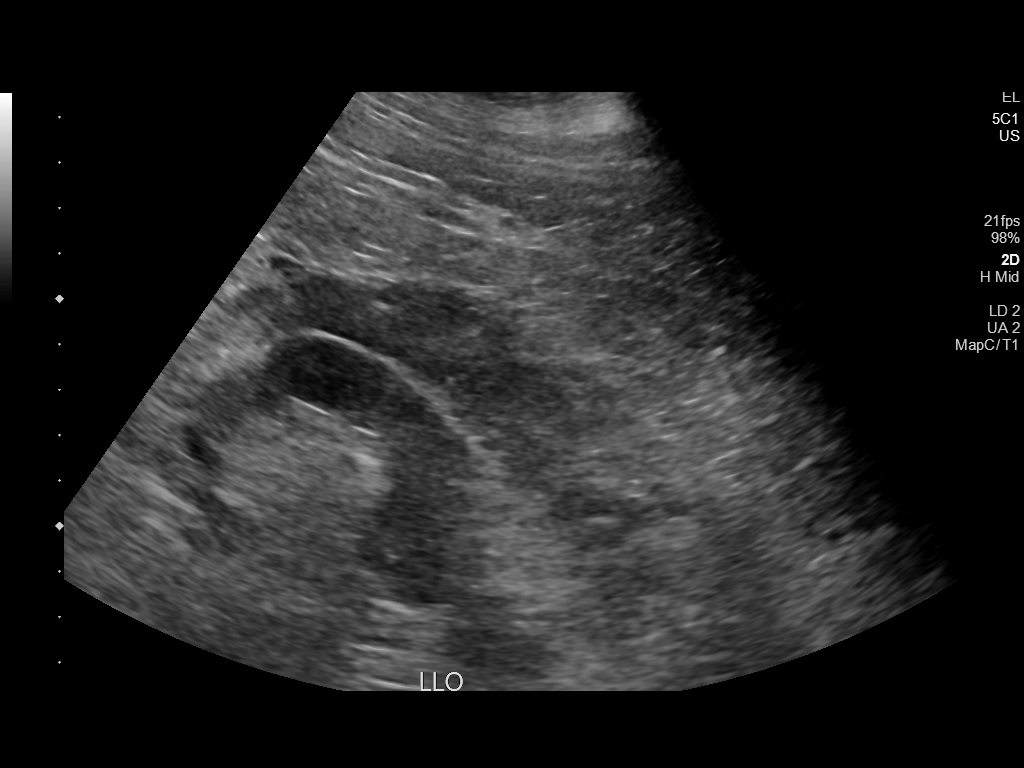
[im 2/4]
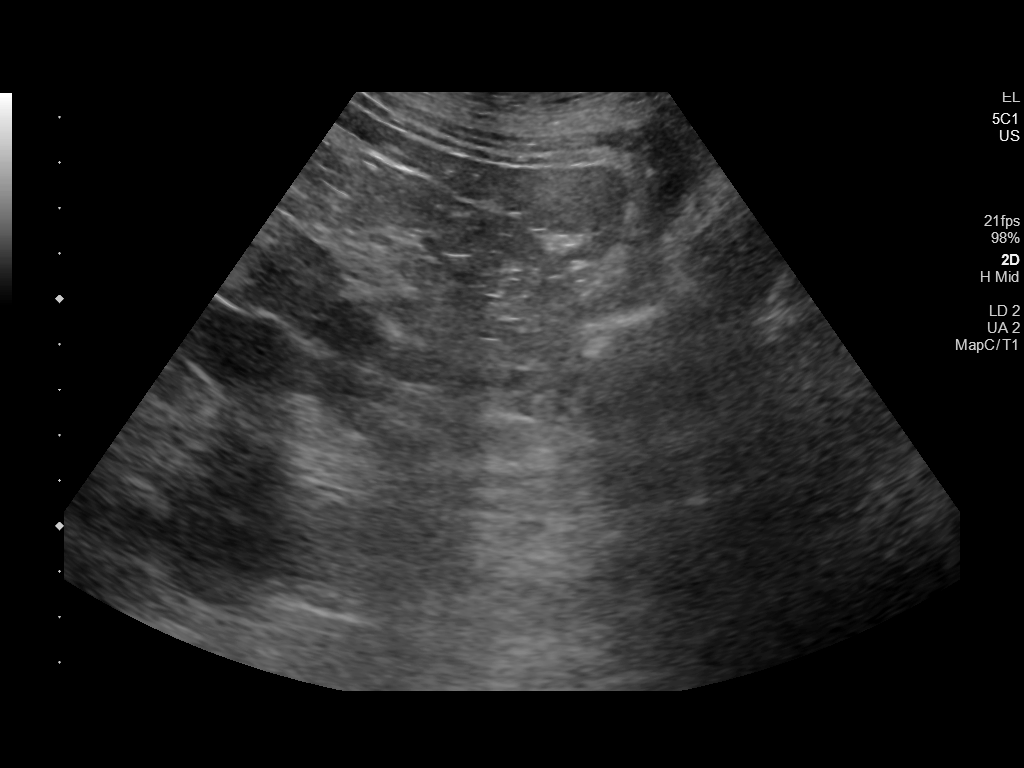
[im 3/4]
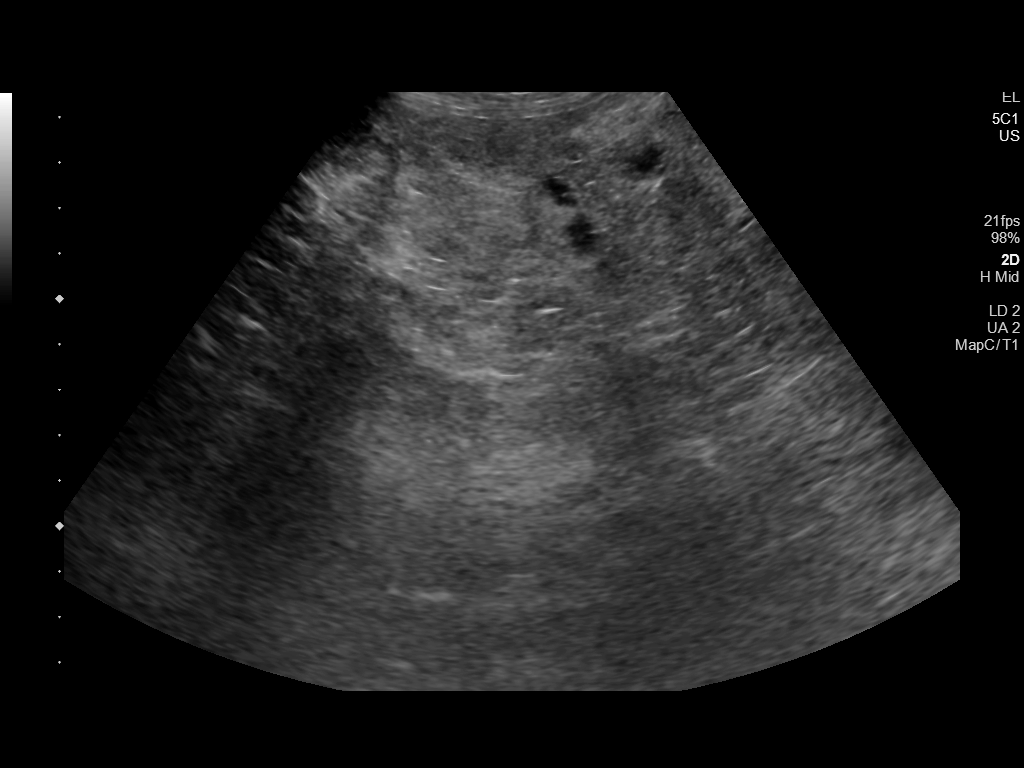
[im 4/4]
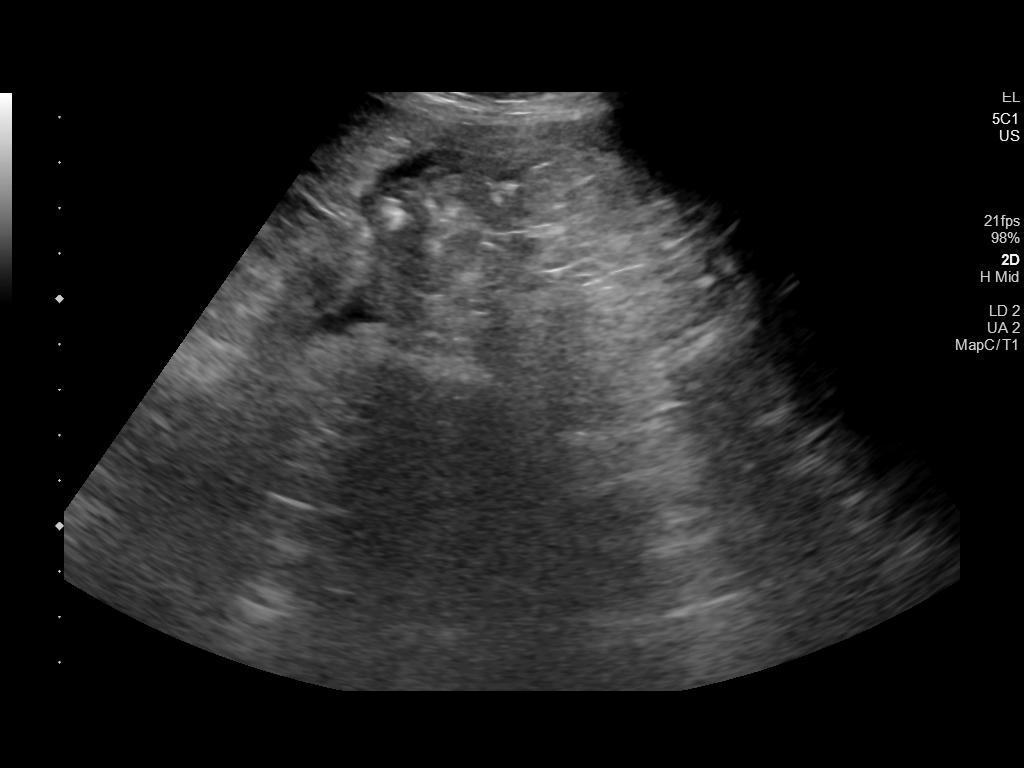

[4 of 4 positions shown; findings below may reference images not displayed]

FINDINGS: Minimal amount of fluid is noted in the left lower quadrant. No
other ascites is noted.
IMPRESSION: Only minimal ascites is noted. Adequate fluid pocket for
paracentesis was not identified.

## 2021-06-05 NOTE — Progress Notes (Signed)
? ?I, Judy Pimple, am serving as a Education administrator for Dr. Lynne Leader. ? ?Patrick Kidd is a 76 y.o. male who presents to [place] at [facility] today for worsening L knee pain. Pt was previously seen by Dr. Tamala Julian on 04/30/21 for bilat knee pain and LBP. Dr. Tamala Julian performed bilat knee steroid injections on 03/27/21. Pt has a lumbar ESI on 05/03/21. Pt has also completed 12 PT visits. Today, pt reports that Tuesday patient could barely walk and take three tylenol and wear a brace to even get through the day and the night. Patient states he feels like there is a tear somewhere. Locates pain to right below the patella. The steroid injections helped a lot with the knees and the epidural helped with the nerve pain, but this left knee is been a problem.  ? ?Dx imaging: 04/09/21 Lumbar MRI ? 12/21/20 L-spine XR ? ?Pertinent review of systems: No fevers or chills ? ?Relevant historical information: Peripheral vascular disease.  Paroxysmal A-fib with Wolff-Parkinson-White.  Diabetes. ? ? ?Exam:  ?BP 110/70   Pulse 75   Ht '6\' 2"'$  (1.88 m)   Wt 240 lb (108.9 kg)   SpO2 96%   BMI 30.81 kg/m?  ?General: Well Developed, well nourished, and in no acute distress.  ? ?MSK: Left knee: Moderate effusion otherwise normal. ?Normal motion with crepitation.  Tender palpation mildly medial joint line. ? ? ? ?Lab and Radiology Results ? ? ? ?Monovisc injection ?Procedure: Real-time Ultrasound Guided Injection of left knee superior lateral patellar space ?Device: Philips Affiniti 50G ?Images permanently stored and available for review in PACS ?Verbal informed consent obtained.  Discussed risks and benefits of procedure. Warned about infection, bleeding, damage to structures among others. ?Patient expresses understanding and agreement ?Time-out conducted.   ?Noted no overlying erythema, induration, or other signs of local infection.   ?Skin prepped in a sterile fashion.   ?Local anesthesia: Topical Ethyl chloride.    ?With sterile technique and under real time ultrasound guidance: Monovisc 4 mL injected into knee joint. Fluid seen entering the joint capsule.   ?Completed without difficulty   ?Advised to call if fevers/chills, erythema, induration, drainage, or persistent bleeding.   ?Images permanently stored and available for review in the ultrasound unit.  ?Impression: Technically successful ultrasound guided injection. ?Lot number: [id number] ? ?X-ray images left knee obtained today personally and independently interpreted ?Moderate medial compartment DJD.  Mild patellofemoral DJD.  No acute fractures. ?Await formal radiology review ? ? ? ?Assessment and Plan: ?76 y.o. male with medial knee pain left knee.  Patient had a steroid injection 2 months ago which worked well but did not last very long.  He is currently authorized for Monovisc for both knees.  Plan for left Monovisc injection today.  Can proceed with a cortisone shot in the knee in 1 month if needed. ? ?We will update x-rays.  Recheck back as needed. ? ? ?PDMP not reviewed this encounter. ?Orders Placed This Encounter  ?Procedures  ? [place] LIMITED JOINT SPACE STRUCTURES LOW LEFT(NO LINKED CHARGES)  ?  Standing Status:   Future  ?  Number of Occurrences:   1  ?  Standing Expiration Date:   06/07/2022  ?  Order Specific Question:   Reason for Exam (SYMPTOM  OR DIAGNOSIS REQUIRED)  ?  Answer:   Left knee pain  ?  Order Specific Question:   Preferred imaging location?  ?  Answer:   [facility]  ? DG  Knee AP/LAT W/[place] Left  ?  Standing Status:   Future  ?  Number of Occurrences:   1  ?  Standing Expiration Date:   06/07/2022  ?  Order Specific Question:   Reason for Exam (SYMPTOM  OR DIAGNOSIS REQUIRED)  ?  Answer:   Left knee pain  ?  Order Specific Question:   Preferred imaging location?  ?  Answer:   [facility]  ? ?No orders of the defined types were placed in this encounter. ? ? ? ?Discussed warning signs or symptoms. Please see discharge  instructions. Patient expresses understanding. ? ? ?The above documentation has been reviewed and is accurate and complete Lynne Leader, M.D. ? ? ?

## 2021-06-06 ENCOUNTER — Ambulatory Visit (INDEPENDENT_AMBULATORY_CARE_PROVIDER_SITE_OTHER): Payer: Medicare Other

## 2021-06-06 ENCOUNTER — Ambulatory Visit (INDEPENDENT_AMBULATORY_CARE_PROVIDER_SITE_OTHER): Payer: Medicare Other | Admitting: Family Medicine

## 2021-06-06 ENCOUNTER — Ambulatory Visit: Payer: Self-pay

## 2021-06-06 VITALS — BP 110/70 | HR 75 | Ht 74.0 in | Wt 240.0 lb

## 2021-06-06 DIAGNOSIS — M1712 Unilateral primary osteoarthritis, left knee: Secondary | ICD-10-CM | POA: Diagnosis not present

## 2021-06-06 DIAGNOSIS — G8929 Other chronic pain: Secondary | ICD-10-CM

## 2021-06-06 DIAGNOSIS — M25562 Pain in left knee: Secondary | ICD-10-CM | POA: Diagnosis not present

## 2021-06-06 NOTE — Patient Instructions (Addendum)
Good to see you  ?Monovisc given in left knee today ?Get x ray on your way back ?Follow up as needed  ?

## 2021-06-10 NOTE — Progress Notes (Signed)
Left knee x-ray shows medium arthritis changes.

## 2021-06-13 ENCOUNTER — Other Ambulatory Visit: Payer: Self-pay | Admitting: Internal Medicine

## 2021-06-13 ENCOUNTER — Telehealth: Payer: Self-pay | Admitting: Internal Medicine

## 2021-06-13 DIAGNOSIS — U071 COVID-19: Secondary | ICD-10-CM | POA: Diagnosis not present

## 2021-06-13 DIAGNOSIS — K219 Gastro-esophageal reflux disease without esophagitis: Secondary | ICD-10-CM

## 2021-06-13 DIAGNOSIS — R053 Chronic cough: Secondary | ICD-10-CM

## 2021-06-13 NOTE — Telephone Encounter (Signed)
PT calls today in regards to their pantoprazole (PROTONIX). PT is trying to receive a refill for this medication and has been notified by the [facility] on [address] that they had sent a fax in for a prescription request. I had let PT know that I had not seen this fax thus far. PT did state that he wanted to check with Dr.Jones about whether he needed to still be taking this medication or not. ? ?CB: [phone] ?

## 2021-06-13 NOTE — Telephone Encounter (Signed)
Called patient to inform him that his medication was sent to his pharmacy  ?

## 2021-06-25 ENCOUNTER — Other Ambulatory Visit: Payer: Self-pay | Admitting: Internal Medicine

## 2021-06-25 ENCOUNTER — Ambulatory Visit (INDEPENDENT_AMBULATORY_CARE_PROVIDER_SITE_OTHER): Payer: Medicare Other | Admitting: Internal Medicine

## 2021-06-25 VITALS — BP 132/74 | HR 82 | Temp 98.5°F | Ht 74.0 in | Wt 245.0 lb

## 2021-06-25 DIAGNOSIS — R350 Frequency of micturition: Secondary | ICD-10-CM

## 2021-06-25 DIAGNOSIS — R351 Nocturia: Secondary | ICD-10-CM | POA: Diagnosis not present

## 2021-06-25 DIAGNOSIS — R32 Unspecified urinary incontinence: Secondary | ICD-10-CM | POA: Diagnosis not present

## 2021-06-25 DIAGNOSIS — N401 Enlarged prostate with lower urinary tract symptoms: Secondary | ICD-10-CM | POA: Diagnosis not present

## 2021-06-25 DIAGNOSIS — N3281 Overactive bladder: Secondary | ICD-10-CM

## 2021-06-25 DIAGNOSIS — C61 Malignant neoplasm of prostate: Secondary | ICD-10-CM | POA: Diagnosis not present

## 2021-06-25 LAB — URINALYSIS, ROUTINE W REFLEX MICROSCOPIC
Bilirubin Urine: NEGATIVE
Hgb urine dipstick: NEGATIVE
Ketones, ur: NEGATIVE
Leukocytes,Ua: NEGATIVE
Nitrite: NEGATIVE
RBC / HPF: NONE SEEN (ref 0–?)
Specific Gravity, Urine: 1.01 (ref 1.000–1.030)
Total Protein, Urine: NEGATIVE
Urine Glucose: >=1000 — AB
Urobilinogen, UA: 0.2 (ref 0.0–1.0)
pH: 6.5 (ref 5.0–8.0)

## 2021-06-25 MED ORDER — SOLIFENACIN SUCCINATE 5 MG PO TABS: 5.0000 mg | ORAL_TABLET | Freq: Every day | ORAL | 3 refills | Status: DC

## 2021-06-25 NOTE — Patient Instructions (Signed)
Please take all new medication as prescribed - the vesicare for the bladder  The urine specimen will be sent to the labs  You will be contacted by phone if any changes need to be made immediately.  Otherwise, you will receive a letter about your results with an explanation, but please check with MyChart first.  Please continue all other medications as before, and refills have been done if requested.  Please have the pharmacy call with any other refills you may need.  Please keep your appointments with your specialists as you may have planned

## 2021-06-25 NOTE — Progress Notes (Unsigned)
Patient ID: Patrick Kidd, male   DOB: October 20, 1945, 76 y.o.   MRN: [id number]        Chief Complaint: follow up urinary urgency and frequency (every 2 hrs at night) and often during daytime       HPI:  Patrick Kidd is a 76 y.o. male here with hx of prostate cA s/p XRt Seeds (no surgury) .  Last psa feb 2023 at 0.2.  Now with several wks persistent  urinary urgency and frequency with incontinence; Denies urinary symptoms such as dysuria, flank pain, hematuria or n/v, fever, chills.  Pt denies chest pain, increased sob or doe, wheezing, orthopnea, PND, increased LE swelling, palpitations, dizziness or syncope.   Pt denies polydipsia, polyuria, or new focal neuro s/s.   Has been out of OAB tx for several months.    BP Readings from Last 3 Encounters:  06/25/21 132/74  06/06/21 110/70  05/21/21 138/74         Past Medical History:  Diagnosis Date   Acrophobia    Alcoholic cirrhosis ([place]) 2/45/8099   Anemia    Arthritis    foot by big toe   BPH associated with nocturia    Cataract    removed both eyes   Chronic cough    PMH of   COVID-19 12/2018   Diabetes mellitus without complication [facility])    Diverticulosis 07-03-2010   Colonoscopy.    Duodenal ulcer 2017   Fallen arches    Bilateral   GERD (gastroesophageal reflux disease)    Gout    Granuloma annulare    Hx of adenomatous colonic polyps multiple   Hydrocele 2011   Large septated right hydrocele   Liver cyst    Liver lesion    Nonspecific elevation of levels of transaminase or lactic acid dehydrogenase (LDH)    Obesity    Peripheral neuropathy    Plantar fasciitis    PMH of   Portal hypertension ([place]) 2017   Prostate cancer ([place])    Right shoulder pain 11/2017   Sleep apnea    no cpap, patient denies   Thiamine deficiency    Wears reading eyeglasses    WPW (Wolff-Parkinson-White syndrome) 02/05/2019   Past Surgical History:  Procedure Laterality Date   CATARACT EXTRACTION, BILATERAL  12/2011    Dr Gershon Crane    COLONOSCOPY  2017   COLONOSCOPY W/ POLYPECTOMY  07/03/2010   2 adenomas, diverticulosis on right. Dr [name]   CYSTOSCOPY N/A 03/15/2018   Procedure: [name];  Surgeon: [name], MD;  Location[facility];  Service: Urology;  Laterality: N/A;  NO SEEDS FOUND IN BLADDER   ESOPHAGOGASTRODUODENOSCOPY (EGD) WITH PROPOFOL N/A 01/08/2016   Procedure: ESOPHAGOGASTRODUODENOSCOPY (EGD) WITH PROPOFOL;  Surgeon: [name], MD;  Location: [facility];  Service: Endoscopy;  Laterality: N/A;   [place]   RADIOACTIVE SEED IMPLANT N/A 03/15/2018   Procedure: RADIOACTIVE SEED IMPLANT/BRACHYTHERAPY IMPLANT;  Surgeon: [name], MD;  Location: [place];  Service: Urology;  Laterality: N/A;   73 SEEDS IMPLANTED   SIGMOIDOSCOPY     SPACE OAR INSTILLATION N/A 03/15/2018   Procedure: SPACE OAR INSTILLATION;  Surgeon: [name], MD;  Location[facility];  Service: Urology;  Laterality: N/A;   WISDOM TOOTH EXTRACTION      reports that he quit smoking about 50 years ago. His smoking use included cigarettes. He has a 12.00 pack-year smoking history. He  has never been exposed to tobacco smoke. He has never used smokeless tobacco. He reports that he does not currently use alcohol. He reports that he does not use drugs. family history includes Leukemia in his father; Lung cancer in his mother. Allergies  Allergen Reactions   Rifaximin Nausea And Vomiting    Required EMS visit, although patient did not go to hospital   Beta Adrenergic Blockers     Likely WPW.  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  Use with caution.   Current Outpatient Medications on File Prior to Visit  Medication Sig Dispense Refill   atorvastatin (LIPITOR) 10 MG tablet Take 1 tablet (10 mg total) by mouth daily. 90 tablet 1   blood glucose meter kit and supplies KIT Use to test blood sugar once daily. DX E11.8 1  each 0   Colchicine (MITIGARE) 0.6 MG CAPS Take 1 tablet by mouth daily. 90 capsule 0   FARXIGA 10 MG TABS tablet Take 1 tablet (10 mg total) by mouth daily before breakfast. 90 tablet 1   folic acid (FOLVITE) 1 MG tablet TAKE 1 TABLET BY MOUTH EVERY DAY 90 tablet 1   gabapentin (NEURONTIN) 300 MG capsule Take 1 capsule (300 mg total) by mouth 3 (three) times daily. 270 capsule 3   glucose blood (COOL BLOOD GLUCOSE TEST STRIPS) test strip Use to check blood sugar daily. DX: E11.8 300 each 3   insulin glargine, 2 Unit Dial, (TOUJEO MAX SOLOSTAR) 300 UNIT/ML Solostar Pen Inject 50 Units into the skin daily. Via [facility] pt assistance 15 mL 1   Lancets (ACCU-CHEK MULTICLIX) lancets Use to check sugar daily. DX: E11.8 100 each 12   metFORMIN (GLUCOPHAGE) 500 MG tablet Take 1 tablet (500 mg total) by mouth 2 (two) times daily with a meal. 180 tablet 1   pantoprazole (PROTONIX) 40 MG tablet TAKE 1 TABLET 30 MINUTES BEFORE MEALS (BREAKFAST AND SUPPER) TWICE A DAY 180 tablet 0   tadalafil (CIALIS) 20 MG tablet Take 1 tablet (20 mg total) by mouth daily as needed for erectile dysfunction. 6 tablet 3   vitamin B-12 (CYANOCOBALAMIN) 1000 MCG tablet Take 1,000 mcg by mouth daily.     No current facility-administered medications on file prior to visit.        ROS:  All others reviewed and negative.  Objective        PE:  BP 132/74 (BP Location: Right Arm, Patient Position: Sitting, Cuff Size: Large)   Pulse 82   Temp 98.5 F (36.9 C) (Oral)   Ht 6' 2"  (1.88 m)   Wt 245 lb (111.1 kg)   SpO2 94%   BMI 31.46 kg/m                 Constitutional: Pt appears in NAD               HENT: Head: NCAT.                Right Ear: External ear normal.                 Left Ear: External ear normal.                Eyes: . Pupils are equal, round, and reactive to light. Conjunctivae and EOM are normal               Nose: without d/c or deformity  Neck: Neck supple. Gross normal ROM                Cardiovascular: Normal rate and regular rhythm.                 Pulmonary/Chest: Effort normal and breath sounds without rales or wheezing.                Abd:  Soft, NT, ND, + BS, no organomegaly               Neurological: Pt is alert. At baseline orientation, motor grossly intact               Skin: Skin is warm. No rashes, no other new lesions, LE edema - none               Psychiatric: Pt behavior is normal without agitation   Micro: none  Cardiac tracings I have personally interpreted today:  none  Pertinent Radiological findings (summarize): none   Lab Results  Component Value Date   WBC 10.3 05/15/2021   HGB 17.1 (H) 05/15/2021   HCT 48.3 05/15/2021   PLT 76 (L) 05/15/2021   GLUCOSE 114 (H) 05/15/2021   CHOL 162 05/21/2021   TRIG 122.0 05/21/2021   HDL 76.80 05/21/2021   LDLCALC 61 05/21/2021   ALT 18 05/15/2021   AST 22 05/15/2021   NA 141 05/15/2021   K 4.1 05/15/2021   CL 108 05/15/2021   CREATININE 0.74 05/15/2021   BUN 17 05/15/2021   CO2 23 05/15/2021   TSH 1.92 03/14/2020   PSA 0.2 05/15/2021   INR 1.1 (H) 03/14/2020   HGBA1C 6.8 (H) 05/15/2021   MICROALBUR <0.7 09/25/2020   Assessment/Plan:  Patrick Kidd is a 76 y.o. White or Caucasian [1] male with  has a past medical history of Acrophobia, Alcoholic cirrhosis ([place]) (10/04/2015), Anemia, Arthritis, BPH associated with nocturia, Cataract, Chronic cough, COVID-19 (12/2018), Diabetes mellitus without complication ([place]), Diverticulosis (07-03-2010), Duodenal ulcer (2017), Fallen arches, GERD (gastroesophageal reflux disease), Gout, Granuloma annulare, adenomatous colonic polyps (multiple), Hydrocele (2011), Liver cyst, Liver lesion, Nonspecific elevation of levels of transaminase or lactic acid dehydrogenase (LDH), Obesity, Peripheral neuropathy, Plantar fasciitis, Portal hypertension ([place]) (2017), Prostate cancer ([place]), Right shoulder pain (11/2017), Sleep apnea, Thiamine deficiency, Wears reading eyeglasses,  and WPW (Wolff-Parkinson-White syndrome) (02/05/2019).  Urinary incontinence Declines urology referral for now, for UA and culture  OAB (overactive bladder) With worsening symptoms, for vesicare 5 qd,  to f/u any worsening symptoms or concerns  Malignant neoplasm of prostate ([place]) Lab Results  Component Value Date   PSA1 0.2 05/15/2021   PSA 0.2 05/15/2021   PSA 0.26 11/23/2019   PSA 0.84 10/07/2018   Stable, to f/u any worsening symptoms or concerns  BPH associated with nocturia Consider add finasteride or flomax, declines for now,  to f/u any worsening symptoms or concerns  Followup: Return if symptoms worsen or fail to improve.  Cathlean Cower, MD 06/27/2021 8:28 PM [facility]

## 2021-06-25 NOTE — Telephone Encounter (Signed)
To PCP please ?

## 2021-06-26 LAB — URINE CULTURE

## 2021-06-27 ENCOUNTER — Encounter: Payer: Self-pay | Admitting: Internal Medicine

## 2021-06-27 NOTE — Assessment & Plan Note (Signed)
With worsening symptoms, for vesicare 5 qd,  to f/u any worsening symptoms or concerns

## 2021-06-27 NOTE — Assessment & Plan Note (Signed)
Consider add finasteride or flomax, declines for now,  to f/u any worsening symptoms or concerns

## 2021-06-27 NOTE — Assessment & Plan Note (Signed)
Lab Results  Component Value Date   PSA1 0.2 05/15/2021   PSA 0.2 05/15/2021   PSA 0.26 11/23/2019   PSA 0.84 10/07/2018   Stable, to f/u any worsening symptoms or concerns

## 2021-06-27 NOTE — Assessment & Plan Note (Addendum)
Declines urology referral for now, for UA and culture

## 2021-07-10 ENCOUNTER — Other Ambulatory Visit: Payer: Self-pay | Admitting: Internal Medicine

## 2021-07-10 DIAGNOSIS — R350 Frequency of micturition: Secondary | ICD-10-CM

## 2021-07-10 DIAGNOSIS — N3281 Overactive bladder: Secondary | ICD-10-CM

## 2021-07-10 DIAGNOSIS — R32 Unspecified urinary incontinence: Secondary | ICD-10-CM

## 2021-07-10 MED ORDER — GEMTESA 75 MG PO TABS: 1.0000 | ORAL_TABLET | Freq: Every day | ORAL | 1 refills | Status: DC

## 2021-07-14 ENCOUNTER — Other Ambulatory Visit: Payer: Self-pay | Admitting: Internal Medicine

## 2021-07-14 DIAGNOSIS — K219 Gastro-esophageal reflux disease without esophagitis: Secondary | ICD-10-CM

## 2021-07-14 DIAGNOSIS — R053 Chronic cough: Secondary | ICD-10-CM

## 2021-07-29 ENCOUNTER — Ambulatory Visit (INDEPENDENT_AMBULATORY_CARE_PROVIDER_SITE_OTHER): Payer: Medicare Other | Admitting: Internal Medicine

## 2021-07-29 ENCOUNTER — Encounter: Payer: Self-pay | Admitting: Internal Medicine

## 2021-07-29 VITALS — BP 124/70 | HR 85 | Ht 74.0 in | Wt 244.2 lb

## 2021-07-29 DIAGNOSIS — I456 Pre-excitation syndrome: Secondary | ICD-10-CM

## 2021-07-29 DIAGNOSIS — I480 Paroxysmal atrial fibrillation: Secondary | ICD-10-CM

## 2021-08-07 ENCOUNTER — Telehealth: Payer: Self-pay

## 2021-08-07 ENCOUNTER — Other Ambulatory Visit: Payer: Self-pay

## 2021-08-07 DIAGNOSIS — K7689 Other specified diseases of liver: Secondary | ICD-10-CM

## 2021-08-07 DIAGNOSIS — K7030 Alcoholic cirrhosis of liver without ascites: Secondary | ICD-10-CM

## 2021-08-07 DIAGNOSIS — N2889 Other specified disorders of kidney and ureter: Secondary | ICD-10-CM

## 2021-08-07 NOTE — Telephone Encounter (Signed)
Pt ordered and scheduled for [place] for 08/16/2021 at 8:00 at [facility]: Pt to arrive at 7:45. Nothing to eat or drink past midnight. Pt made aware. Pt verbalized understanding with all questions answered.

## 2021-08-07 NOTE — Telephone Encounter (Signed)
-----   Message from Marlon Pel, RN sent at 08/05/2021  1:02 PM EDT ----- [name] patient ----- Message ----- From: Hughie Closs, RN Sent: 08/05/2021  12:00 AM EDT To: Marlon Pel, RN  Needs 1 yr. F/U US = 08/14/21. See MRI result note on 08/14/20.

## 2021-08-12 ENCOUNTER — Encounter: Payer: Self-pay | Admitting: Internal Medicine

## 2021-08-16 ENCOUNTER — Ambulatory Visit (HOSPITAL_COMMUNITY)
Admission: RE | Admit: 2021-08-16 | Discharge: 2021-08-16 | Disposition: A | Discharge status: Home / Self Care | Payer: Medicare Other | Source: Ambulatory Visit | Attending: Internal Medicine | Admitting: Internal Medicine

## 2021-08-16 DIAGNOSIS — N2889 Other specified disorders of kidney and ureter: Secondary | ICD-10-CM | POA: Insufficient documentation

## 2021-08-16 DIAGNOSIS — K828 Other specified diseases of gallbladder: Secondary | ICD-10-CM | POA: Diagnosis not present

## 2021-08-16 DIAGNOSIS — K7689 Other specified diseases of liver: Secondary | ICD-10-CM | POA: Diagnosis not present

## 2021-08-16 DIAGNOSIS — K8020 Calculus of gallbladder without cholecystitis without obstruction: Secondary | ICD-10-CM | POA: Diagnosis not present

## 2021-08-16 DIAGNOSIS — K7030 Alcoholic cirrhosis of liver without ascites: Secondary | ICD-10-CM | POA: Insufficient documentation

## 2021-08-16 DIAGNOSIS — R161 Splenomegaly, not elsewhere classified: Secondary | ICD-10-CM | POA: Diagnosis not present

## 2021-08-20 ENCOUNTER — Telehealth: Payer: Self-pay | Admitting: Internal Medicine

## 2021-08-20 NOTE — Telephone Encounter (Signed)
Spoke with Pt. Documented under result notes 

## 2021-08-20 NOTE — Telephone Encounter (Signed)
Patient returned your call, please advise. 

## 2021-08-26 NOTE — Progress Notes (Unsigned)
NEUROLOGY FOLLOW UP OFFICE NOTE  Patrick Kidd [id number]  Assessment/Plan:   Diabetic polyneuropathy Muscle spasms involving buttock   gabapentin 373m in AM and 6019mat bedtime For muscle spasms, cyclobenzaprine 1066mhree times daily as needed Follow up one year or as needed.   Subjective:  Patrick Kidd a 75 4o. right-handed male who follows up for diabetic polyneuropathy.   UPDATE: He is taking gabapentin 300m10mree times daily (he takes it as 300mg11mAM and 600mg 57medtime).     Increased gabapentin to from BID to TID last year.  He also has lumbar spinal stenosis.  He had an epidural injection for right sciatica.  It was helpful but since then, he had muscle spasm in his right buttock when he lays down supine.  Hgb A1c 6.8.   HISTORY: Onset of symptoms began in 2010.  He describes initially experiencing numbness on the bottom of both feet, in which recently it has spread to the ankles. It is also associated with an uncomfortable tingling sensation. It is not painful but it is uncomfortable and usually bothers him at night.  He doesn't really notice it if he is active.  He is concerned about his balance.  The first time, he was getting out of a boat and felt that his right leg was not strong enough to hold his weight and he fell to his knees. The second time, he tripped over a chair and fell down to his knees.  He felt that his leg just gave out when it paired his weight. He is particularly concerned, because he does enjoy riding on a boat and dancing.  More recently, he stood up from a chair at a game and felt shaky and unsteady.  He has not been exercising as much due to fear of imbalance.  He denies any pain radiating down the legs.  He has pain in his right big toe after an 8 pound frozen duck breast fell on it.  He has tingling in the finger tips.   Labs from 08/04/12 revealed ESR 29, ANA negative, Lyme negative.  SPEP revealed possible faint restricted  band in the gamma region, IFE revealed polyclonal gammopathy involving IgA and IgM.  Prior labs last month revealed Hgb A1c 5.7, TSH 2.42, RPR non-reactive, serum glucose 131.  CBC and CMP otherwise unremarkable.  B12 from 5/12 was 1293.  Fasting glucose was 143 and 2 hour glucose tolerance test was 341.  However, he was diagnosed with type 2 diabetes after Hgb A1c from 01/09/16 was found to be 8.4.  PAST MEDICAL HISTORY: Past Medical History:  Diagnosis Date   Acrophobia    Alcoholic cirrhosis (HCC) [place]/21/02/5852mia    Arthritis    foot by big toe   BPH associated with nocturia    Cataract    removed both eyes   Chronic cough    PMH of   COVID-19 12/2018   [facility]   [facility]on[facility].    Duodenal ulcer 2017   Fallen arches    Bilateral   GERD (gastroesophageal reflux disease)    Gout    Granuloma annulare    Hx of adenomatous colonic polyps multiple   Hydrocele 2011   Large septated right hydrocele   Liver cyst    Liver lesion    Nonspecific elevation of levels of transaminase or lactic acid dehydrogenase (LDH)    Obesity    Peripheral  neuropathy    Plantar fasciitis    PMH of   Portal hypertension ([place]) 2017   Prostate cancer ([place])    Right shoulder pain 11/2017   Sleep apnea    no cpap, patient denies   Thiamine deficiency    Wears reading eyeglasses    WPW (Wolff-Parkinson-White syndrome) 02/05/2019    MEDICATIONS: Current Outpatient Medications on File Prior to Visit  Medication Sig Dispense Refill   blood glucose meter kit and supplies KIT Use to test blood sugar once daily. DX E11.8 1 each 0   Colchicine (MITIGARE) 0.6 MG CAPS Take 1 tablet by mouth daily. 90 capsule 0   FARXIGA 10 MG TABS tablet Take 1 tablet (10 mg total) by mouth daily before breakfast. 90 tablet 1   gabapentin (NEURONTIN) 300 MG capsule Take 1 capsule (300 mg total) by mouth 3 (three) times daily. 270 capsule 3   glucose blood  (COOL BLOOD GLUCOSE TEST STRIPS) test strip Use to check blood sugar daily. DX: E11.8 300 each 3   insulin glargine, 2 Unit Dial, (TOUJEO MAX SOLOSTAR) 300 UNIT/ML Solostar Pen Inject 50 Units into the skin daily. Via SANOFI pt assistance 15 mL 1   Lancets (ACCU-CHEK MULTICLIX) lancets Use to check sugar daily. DX: E11.8 100 each 12   metFORMIN (GLUCOPHAGE) 500 MG tablet Take 1 tablet (500 mg total) by mouth 2 (two) times daily with a meal. 180 tablet 1   pantoprazole (PROTONIX) 40 MG tablet TAKE 1 TABLET 30 MINUTES BEFORE MEALS (BREAKFAST AND SUPPER) TWICE A DAY 180 tablet 0   tadalafil (CIALIS) 20 MG tablet Take 1 tablet (20 mg total) by mouth daily as needed for erectile dysfunction. 6 tablet 3   Vibegron (GEMTESA) 75 MG TABS Take 1 tablet by mouth daily. 90 tablet 1   vitamin B-12 (CYANOCOBALAMIN) 1000 MCG tablet Take 1,000 mcg by mouth daily.     No current facility-administered medications on file prior to visit.    ALLERGIES: Allergies  Allergen Reactions   Rifaximin Nausea And Vomiting    Required EMS visit, although patient did not go to hospital   Beta Adrenergic Blockers     Likely WPW.  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  Use with caution.    FAMILY HISTORY: Family History  Problem Relation Age of Onset   Lung cancer Mother        smoker   Leukemia Father        Acute myelocytic   Diabetes Neg Hx    Stroke Neg Hx    Heart disease Neg Hx    Colon cancer Neg Hx    Colon polyps Neg Hx    Esophageal cancer Neg Hx    Rectal cancer Neg Hx    Stomach cancer Neg Hx       Objective:  Blood pressure (!) 143/85, pulse 81, height 6' 2"  (1.88 m), weight 240 lb 9.6 oz (109.1 kg), SpO2 98 %. General: No acute distress.  Patient appears well-groomed.   Head:  Normocephalic/atraumatic Eyes:  Fundi examined but not visualized Neck: supple, no paraspinal tenderness, full range of motion Heart:  Regular rate and rhythm Neurological Exam: alert and  oriented to person, place, and time.  Speech fluent and not dysarthric, language intact.  CN II-XII intact. Bulk and tone normal, muscle strength 5/5 throughout.  Sensation to pinprick and vibratory sensation reduced in lower extremities.  Deep tendon reflexes absent throughout, toes downgoing.  Finger to nose testing intact.  Gait slightly unsteady. Romberg with mild sway.   Patrick Clines, DO  CC: Scarlette Calico, MD

## 2021-08-27 ENCOUNTER — Encounter: Payer: Self-pay | Admitting: Neurology

## 2021-08-27 ENCOUNTER — Ambulatory Visit (INDEPENDENT_AMBULATORY_CARE_PROVIDER_SITE_OTHER): Payer: Medicare Other | Admitting: Neurology

## 2021-08-27 VITALS — BP 143/85 | HR 81 | Ht 74.0 in | Wt 240.6 lb

## 2021-08-27 DIAGNOSIS — E1142 Type 2 diabetes mellitus with diabetic polyneuropathy: Secondary | ICD-10-CM | POA: Diagnosis not present

## 2021-08-27 DIAGNOSIS — M62838 Other muscle spasm: Secondary | ICD-10-CM | POA: Diagnosis not present

## 2021-08-27 MED ORDER — CYCLOBENZAPRINE HCL 10 MG PO TABS: 10.0000 mg | ORAL_TABLET | Freq: Three times a day (TID) | ORAL | 3 refills | Status: DC | PRN

## 2021-08-27 NOTE — Patient Instructions (Signed)
Gabapentin '300mg'$  in morning and '600mg'$  at bedtime For muscle spasms in buttock, take cyclobenzaprine '10mg'$  (may take up to 3 times daily as needed Follow up one year.

## 2021-09-03 ENCOUNTER — Telehealth: Payer: Self-pay

## 2021-09-03 NOTE — Patient Outreach (Signed)
[facility]) Care Management  09/03/2021  Patrick Kidd 1945/07/06 [id number]    Care Management   Outreach Note  09/03/2021 Name: Patrick Kidd MRN: [id number] DOB: [birth year]  Successful contact was made with the patient to discuss care management and care coordination services. Patient declines engagement at this time.   Follow Up Plan: No further follow up required.   Thea Silversmith, RN, MSN, BSN, CCM Care  Coordinator [phone]

## 2021-09-04 ENCOUNTER — Telehealth: Payer: Medicare Other

## 2021-09-05 ENCOUNTER — Telehealth: Payer: Self-pay

## 2021-09-05 NOTE — Telephone Encounter (Signed)
LVM stating that patient assistance meds have arrived.  Located in clinical lab fridge.

## 2021-09-24 ENCOUNTER — Telehealth: Payer: Self-pay | Admitting: Internal Medicine

## 2021-09-24 NOTE — Telephone Encounter (Signed)
Caller & Relationship to patient: Lucah Petta  Call back number: [phone]  Date of last office visit: 05/21/21  Date of next office visit: 11/20/21  Medication(s) to be refilled:  FARXIGA 10 MG TABS tablet   Preferred Pharmacy:  [place], [address] Phone:  [phone]  Fax:  [phone]

## 2021-09-25 ENCOUNTER — Other Ambulatory Visit: Payer: Self-pay

## 2021-09-25 DIAGNOSIS — E118 Type 2 diabetes mellitus with unspecified complications: Secondary | ICD-10-CM

## 2021-09-25 MED ORDER — FARXIGA 10 MG PO TABS: 10.0000 mg | ORAL_TABLET | Freq: Every day | ORAL | 1 refills | Status: DC

## 2021-09-25 NOTE — Telephone Encounter (Signed)
Patient medication was sent to the pharmacy

## 2021-10-02 ENCOUNTER — Telehealth: Payer: Self-pay

## 2021-10-02 NOTE — Telephone Encounter (Signed)
Pt informed that patient assistance meds are ready for pick up.

## 2021-10-07 NOTE — Progress Notes (Addendum)
Patrick Kidd 76 y.o. [birth year] [id number]  Assessment & Plan:   Encounter Diagnoses  Name Primary?   Alcoholic cirrhosis, unspecified whether ascites present ([place]) Yes   Encephalopathy, hepatic (HCC) - Past    SBP (spontaneous bacterial peritonitis) ([place]) - past    Overall he appears stable.  I once again reminded him that abstinence from alcohol is the appropriate behavior in his case.  He seems to have tolerated coming off lactulose without clinical evidence of encephalopathy.   Orders Placed This Encounter  Procedures   CBC with Differential/Platelet   Comprehensive metabolic panel   Protime-INR   AFP tumor marker   Ambulatory referral to Gastroenterology-EGD for variceal screening   Lab Results  Component Value Date   WBC 8.1 10/08/2021   HGB 16.7 10/08/2021   HCT 48.3 10/08/2021   MCV 95.5 10/08/2021   PLT 81.0 (L) 10/08/2021    Lab Results  Component Value Date   INR 0.9 10/08/2021   INR 1.1 (H) 03/14/2020   INR 1.0 11/16/2019   Lab Results  Component Value Date   ALT 8 10/08/2021   AST 16 10/08/2021   ALKPHOS 63 10/08/2021   BILITOT 1.1 10/08/2021   Lab Results  Component Value Date   CREATININE 0.88 10/08/2021   BUN 14 10/08/2021   NA 137 10/08/2021   K 4.3 10/08/2021   CL 103 10/08/2021   CO2 25 10/08/2021   Glucose 198 Albumin 3.9   CHILD'S A 5 points MELD 3.0 = 7 points  AFP pending  I appreciate the opportunity to care for him  QG:BEEFE, Arvid Right, MD   Subjective:   Chief Complaint: Follow-up of alcoholic cirrhosis   GI problem summary:  Cirrhosis (EtOH plus or minus NASH) clinical diagnosis 2017 with history of SBP (01/23/2019) and hepatic encephalopathy No history of varices last EGD 06/22/2020 Ascites did resolve Stopped lactulose 2022 Large right hepatic cyst stable Rectal Varices colonoscopy 2021  History of GERD and distal esophageal stenosis dilated 2017  History of colon polyps-multiple adenomas 2017  2 diminutive polyps 2021 1 adenoma 1 hyperplastic recall suspended due to age   HPI 76 yo wm w/ EtOH cirrhosis + ascites, hepatic encephalopathy and hx SBP, WPW, prostate cancer here for f/u. Last seen 4.2023 and reporeted drinking a glass of wine once a month.  He still says he drinks "not much". He is complaining of some buttock pain on the right and says that has been a problem since he had an epidural for back pain and sciatica issues.  No active GI complaints.  At some point he stopped lactulose which she was on last year.  He does not remember when.  Says he lost 12# eating 2 meals/day  Dx CLL Fall 2022-followed by Dr. Narda Rutherford   [facility] from Last 3 Encounters:  10/08/21 243 lb 9.6 oz (110.5 kg)  08/27/21 240 lb 9.6 oz (109.1 kg)  07/29/21 244 lb 3.2 oz (110.8 kg)    [place] Abd 08/2021 IMPRESSION: 1. Chronic right hepatic lobe cyst. No other liver lesion by ultrasound. 2. Mild splenomegaly suggestive of portal venous hypertension. No ascites. 3. Small volume gallbladder sludge and small layering gallstones. No evidence of acute cholecystitis. 4. Kidneys appear stable and negative. Lab Results  Component Value Date   ALT 18 05/15/2021   AST 22 05/15/2021   ALKPHOS 57 05/15/2021   BILITOT 1.1 05/15/2021   Lab Results  Component Value Date   INR 1.1 (H) 03/14/2020  INR 1.0 11/16/2019   INR 1.1 (H) 06/09/2019   Lab Results  Component Value Date   CREATININE 0.74 05/15/2021   BUN 17 05/15/2021   NA 141 05/15/2021   K 4.1 05/15/2021   CL 108 05/15/2021   CO2 23 05/15/2021   Lab Results  Component Value Date   WBC 10.3 05/15/2021   HGB 17.1 (H) 05/15/2021   HCT 48.3 05/15/2021   MCV 92.5 05/15/2021   PLT 76 (L) 05/15/2021  EGD 06/2020 - plan recall 06/2021 - Normal esophagus. - Friable (with contact bleeding) mucosa in the cardia, gastric fundus and gastric body. ? portal gastropathy - The examination was otherwise normal. - No specimens  collected. Allergies  Allergen Reactions   Rifaximin Nausea And Vomiting    Required EMS visit, although patient did not go to hospital   Beta Adrenergic Blockers     Likely WPW.  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  Use with caution.   Current Meds  Medication Sig   blood glucose meter kit and supplies KIT Use to test blood sugar once daily. DX E11.8   Colchicine (MITIGARE) 0.6 MG CAPS Take 1 tablet by mouth daily.   cyclobenzaprine (FLEXERIL) 10 MG tablet Take 1 tablet (10 mg total) by mouth 3 (three) times daily as needed for muscle spasms.   gabapentin (NEURONTIN) 300 MG capsule Take 1 capsule (300 mg total) by mouth 3 (three) times daily.   glucose blood (COOL BLOOD GLUCOSE TEST STRIPS) test strip Use to check blood sugar daily. DX: E11.8   insulin glargine, 2 Unit Dial, (TOUJEO MAX SOLOSTAR) 300 UNIT/ML Solostar Pen Inject 50 Units into the skin daily. Via SANOFI pt assistance   Lancets (ACCU-CHEK MULTICLIX) lancets Use to check sugar daily. DX: E11.8   metFORMIN (GLUCOPHAGE) 500 MG tablet Take 1 tablet (500 mg total) by mouth 2 (two) times daily with a meal.   Vibegron (GEMTESA) 75 MG TABS Take 1 tablet by mouth daily.   vitamin B-12 (CYANOCOBALAMIN) 1000 MCG tablet Take 1,000 mcg by mouth daily.   Zinc Sulfate (ZINC 15 PO) Take by mouth.   Past Medical History:  Diagnosis Date   Acrophobia    Alcoholic cirrhosis ([place]) 5/88/3254   Anemia    Arthritis    foot by big toe   BPH associated with nocturia    Cataract    removed both eyes   Chronic cough    PMH of   COVID-19 12/2018   Diabetes mellitus without complication [facility])    Diverticulosis 07-03-2010   Colonoscopy.    Duodenal ulcer 2017   Fallen arches    Bilateral   GERD (gastroesophageal reflux disease)    Gout    Granuloma annulare    Hx of adenomatous colonic polyps multiple   Hydrocele 2011   Large septated right hydrocele   Liver cyst    Liver lesion    Nonspecific elevation of  levels of transaminase or lactic acid dehydrogenase (LDH)    Obesity    Peripheral neuropathy    Plantar fasciitis    PMH of   Portal hypertension ([place]) 2017   Prostate cancer ([place])    Right shoulder pain 11/2017   Sleep apnea    no cpap, patient denies   Thiamine deficiency    Wears reading eyeglasses    WPW (Wolff-Parkinson-White syndrome) 02/05/2019   Past Surgical History:  Procedure Laterality Date   CATARACT EXTRACTION, BILATERAL  12/2011  Dr Gershon Crane   COLONOSCOPY  2017   COLONOSCOPY W/ POLYPECTOMY  07/03/2010   2 adenomas, diverticulosis on right. Dr [name]   CYSTOSCOPY N/A 03/15/2018   Procedure: [name];  Surgeon: [name], MD;  Location: [facility];  Service: Urology;  Laterality: N/A;  NO SEEDS FOUND IN BLADDER   ESOPHAGOGASTRODUODENOSCOPY (EGD) WITH PROPOFOL N/A 01/08/2016   Procedure: ESOPHAGOGASTRODUODENOSCOPY (EGD) WITH PROPOFOL;  Surgeon: [name], MD;  Location: [facility];  Service: Endoscopy;  Laterality: N/A;   [place]   RADIOACTIVE SEED IMPLANT N/A 03/15/2018   Procedure: RADIOACTIVE SEED IMPLANT/BRACHYTHERAPY IMPLANT;  Surgeon: [name], MD;  Location: [facility];  Service: Urology;  Laterality: N/A;   73 SEEDS IMPLANTED   SIGMOIDOSCOPY     SPACE OAR INSTILLATION N/A 03/15/2018   Procedure: SPACE OAR INSTILLATION;  Surgeon: [name], MD;  Location: [facility];  Service: Urology;  Laterality: N/A;   WISDOM TOOTH EXTRACTION     Social History   Social History Narrative   Worked in Adult nurse estate   2 children   Former smoker no drug use   Alcoholism, abstinent since 01/23/2019 most recently   Fun/Hobby: Play golf, grandchildren, [employer]      Patient is left-handed. He lives with his wife in a one level home. He swims most days.   family history includes Leukemia in his father; Lung cancer in his mother.   Review of Systems As per  HPI  Objective:   Physical Exam BP 130/72   Pulse (!) 102   Ht 6' 2"  (1.88 m)   Wt 243 lb 9.6 oz (110.5 kg)   SpO2 98%   BMI 31.28 kg/m  Obese wm NAD Alert and oriented x 3 no asterixis Lungs cta Cor NL S1S2 no rmg Abd obese soft NT, no HSM/mass Ext no edema

## 2021-10-08 ENCOUNTER — Ambulatory Visit (INDEPENDENT_AMBULATORY_CARE_PROVIDER_SITE_OTHER): Payer: Medicare Other | Admitting: Internal Medicine

## 2021-10-08 ENCOUNTER — Other Ambulatory Visit (INDEPENDENT_AMBULATORY_CARE_PROVIDER_SITE_OTHER): Payer: Medicare Other

## 2021-10-08 ENCOUNTER — Encounter: Payer: Self-pay | Admitting: Internal Medicine

## 2021-10-08 VITALS — BP 130/72 | HR 102 | Ht 74.0 in | Wt 243.6 lb

## 2021-10-08 DIAGNOSIS — K7030 Alcoholic cirrhosis of liver without ascites: Secondary | ICD-10-CM | POA: Diagnosis not present

## 2021-10-08 DIAGNOSIS — K7682 Hepatic encephalopathy: Secondary | ICD-10-CM | POA: Diagnosis not present

## 2021-10-08 DIAGNOSIS — K652 Spontaneous bacterial peritonitis: Secondary | ICD-10-CM | POA: Diagnosis not present

## 2021-10-08 LAB — COMPREHENSIVE METABOLIC PANEL
ALT: 8 U/L (ref 0–53)
AST: 16 U/L (ref 0–37)
Albumin: 3.9 g/dL (ref 3.5–5.2)
Alkaline Phosphatase: 63 U/L (ref 39–117)
BUN: 14 mg/dL (ref 6–23)
CO2: 25 mEq/L (ref 19–32)
Calcium: 9 mg/dL (ref 8.4–10.5)
Chloride: 103 mEq/L (ref 96–112)
Creatinine, Ser: 0.88 mg/dL (ref 0.40–1.50)
GFR: 83.26 mL/min (ref >60.00)
Glucose, Bld: 198 mg/dL — ABNORMAL HIGH (ref 70–99)
Potassium: 4.3 mEq/L (ref 3.5–5.1)
Sodium: 137 mEq/L (ref 135–145)
Total Bilirubin: 1.1 mg/dL (ref 0.2–1.2)
Total Protein: 6.6 g/dL (ref 6.0–8.3)

## 2021-10-08 LAB — CBC WITH DIFFERENTIAL/PLATELET
Basophils Absolute: 0 10*3/uL (ref 0.0–0.1)
Basophils Relative: 0.5 % (ref 0.0–3.0)
Eosinophils Absolute: 0.1 10*3/uL (ref 0.0–0.7)
Eosinophils Relative: 0.8 % (ref 0.0–5.0)
HCT: 48.3 % (ref 39.0–52.0)
Hemoglobin: 16.7 g/dL (ref 13.0–17.0)
Lymphocytes Relative: 53.1 % — ABNORMAL HIGH (ref 12.0–46.0)
Lymphs Abs: 4.3 10*3/uL — ABNORMAL HIGH (ref 0.7–4.0)
MCHC: 34.6 g/dL (ref 30.0–36.0)
MCV: 95.5 fl (ref 78.0–100.0)
Monocytes Absolute: 0.5 10*3/uL (ref 0.1–1.0)
Monocytes Relative: 5.8 % (ref 3.0–12.0)
Neutro Abs: 3.2 10*3/uL (ref 1.4–7.7)
Neutrophils Relative %: 39.8 % — ABNORMAL LOW (ref 43.0–77.0)
Platelets: 81 10*3/uL — ABNORMAL LOW (ref 150.0–400.0)
RBC: 5.06 Mil/uL (ref 4.22–5.81)
RDW: 14.3 % (ref 11.5–15.5)
WBC: 8.1 10*3/uL (ref 4.0–10.5)

## 2021-10-08 LAB — PROTIME-INR
INR: 0.9 ratio (ref 0.8–1.0)
Prothrombin Time: 10.2 s (ref 9.6–13.1)

## 2021-10-08 NOTE — Patient Instructions (Signed)
Your provider has requested that you go to the basement level for lab work before leaving today. Press "B" on the elevator. The lab is located at the first door on the left as you exit the elevator.  Due to recent changes in healthcare laws, you may see the results of your imaging and laboratory studies on MyChart before your provider has had a chance to review them.  We understand that in some cases there may be results that are confusing or concerning to you. Not all laboratory results come back in the same time frame and the provider may be waiting for multiple results in order to interpret others.  Please give [place] 48 hours in order for your provider to thoroughly review all the results before contacting the office for clarification of your results.   You have been scheduled for an endoscopy. Please follow written instructions given to you at your visit today. If you use inhalers (even only as needed), please bring them with you on the day of your procedure.   I appreciate the opportunity to care for you. Silvano Rusk, MD, [facility]

## 2021-10-09 LAB — AFP TUMOR MARKER: AFP-Tumor Marker: 2.3 ng/mL (ref <6.1)

## 2021-10-17 ENCOUNTER — Ambulatory Visit (AMBULATORY_SURGERY_CENTER): Payer: Medicare Other | Admitting: Internal Medicine

## 2021-10-17 ENCOUNTER — Encounter: Payer: Self-pay | Admitting: Internal Medicine

## 2021-10-17 VITALS — BP 143/82 | HR 83 | Temp 97.5°F | Resp 17 | Ht 74.0 in | Wt 243.0 lb

## 2021-10-17 DIAGNOSIS — K7030 Alcoholic cirrhosis of liver without ascites: Secondary | ICD-10-CM | POA: Diagnosis not present

## 2021-10-17 DIAGNOSIS — K2960 Other gastritis without bleeding: Secondary | ICD-10-CM

## 2021-10-17 DIAGNOSIS — G4733 Obstructive sleep apnea (adult) (pediatric): Secondary | ICD-10-CM | POA: Diagnosis not present

## 2021-10-17 DIAGNOSIS — K3189 Other diseases of stomach and duodenum: Secondary | ICD-10-CM | POA: Diagnosis not present

## 2021-10-17 DIAGNOSIS — K7460 Unspecified cirrhosis of liver: Secondary | ICD-10-CM

## 2021-10-17 MED ORDER — SODIUM CHLORIDE 0.9 % IV SOLN: 500.0000 mL | Freq: Once | INTRAVENOUS | Status: DC

## 2021-10-17 NOTE — Progress Notes (Signed)
Report to PACU, RN, vss, BBS= Clear.  

## 2021-10-17 NOTE — Op Note (Signed)
[place] Patient Name: Dene Nazir Procedure Date: 10/17/2021 2:57 PM MRN: [id number] Endoscopist: Gatha Mayer , MD Age: 76 Referring MD:  Date of Birth: [birth year] Gender: Male Account #: [id number] Procedure:                Upper GI endoscopy Indications:              Cirrhosis rule out esophageal varices Medicines:                Monitored Anesthesia Care Procedure:                Pre-Anesthesia Assessment:                           - Prior to the procedure, a History and Physical                            was performed, and patient medications and                            allergies were reviewed. The patient's tolerance of                            previous anesthesia was also reviewed. The risks                            and benefits of the procedure and the sedation                            options and risks were discussed with the patient.                            All questions were answered, and informed consent                            was obtained. Prior Anticoagulants: The patient has                            taken no previous anticoagulant or antiplatelet                            agents. ASA Grade Assessment: III - A patient with                            severe systemic disease. After reviewing the risks                            and benefits, the patient was deemed in                            satisfactory condition to undergo the procedure.                           After obtaining informed consent, the endoscope was  passed under direct vision. Throughout the                            procedure, the patient's blood pressure, pulse, and                            oxygen saturations were monitored continuously. The                            Endoscope was introduced through the mouth, and                            advanced to the second part of duodenum. The upper                            GI endoscopy  was accomplished without difficulty.                            The patient tolerated the procedure well. Scope In: Scope Out: Findings:                 The examined esophagus was normal.                           Diffuse mildly erythematous mucosa without bleeding                            was found in the entire examined stomach.                           The cardia and gastric fundus were otherwise normal                            on retroflexion.                           The exam was otherwise without abnormality. Complications:            No immediate complications. Estimated Blood Loss:     Estimated blood loss: none. Impression:               - Normal esophagus.                           - Erythematous mucosa in the stomach. Very mild and                            not friable like last year.                           - The examination was otherwise normal. Do not see                            signs of portal hypertension.                           -  No specimens collected. Recommendation:           - Patient has a contact number available for                            emergencies. The signs and symptoms of potential                            delayed complications were discussed with the                            patient. Return to normal activities tomorrow.                            Written discharge instructions were provided to the                            patient.                           - Resume previous diet.                           - Continue present medications.                           - Repeat upper endoscopy. Gatha Mayer, MD 10/17/2021 3:28:56 PM This report has been signed electronically.

## 2021-10-17 NOTE — Patient Instructions (Addendum)
Things look good - no signs of complication from cirrhosis - no enlarged esophageal veins (varices) or other problems seen.  I appreciate the opportunity to care for you. Gatha Mayer, MD, FACG  YOU HAD AN ENDOSCOPIC PROCEDURE TODAY AT [facility]:   Refer to the procedure report that was given to you for any specific questions about what was found during the examination.  If the procedure report does not answer your questions, please call your gastroenterologist to clarify.  If you requested that your care partner not be given the details of your procedure findings, then the procedure report has been included in a sealed envelope for you to review at your convenience later.  YOU SHOULD EXPECT: Some feelings of bloating in the abdomen. Passage of more gas than usual.  Walking can help get rid of the air that was put into your GI tract during the procedure and reduce the bloating. If you had a lower endoscopy (such as a colonoscopy or flexible sigmoidoscopy) you may notice spotting of blood in your stool or on the toilet paper. If you underwent a bowel prep for your procedure, you may not have a normal bowel movement for a few days.  Please Note:  You might notice some irritation and congestion in your nose or some drainage.  This is from the oxygen used during your procedure.  There is no need for concern and it should clear up in a day or so.  SYMPTOMS TO REPORT IMMEDIATELY:  Following upper endoscopy (EGD)  Vomiting of blood or coffee ground material  New chest pain or pain under the shoulder blades  Painful or persistently difficult swallowing  New shortness of breath  Fever of 100F or higher  Black, tarry-looking stools  For urgent or emergent issues, a gastroenterologist can be reached at any hour by calling [phone]. Do not use MyChart messaging for urgent concerns.    DIET:  We do recommend a small meal at first, but then you may proceed to your regular  diet.  Drink plenty of fluids but you should avoid alcoholic beverages for 24 hours.  ACTIVITY:  You should plan to take it easy for the rest of today and you should NOT DRIVE or use heavy machinery until tomorrow (because of the sedation medicines used during the test).    FOLLOW UP: Our staff will call the number listed on your records the next business day following your procedure.  We will call around 7:15- 8:00 am to check on you and address any questions or concerns that you may have regarding the information given to you following your procedure. If we do not reach you, we will leave a message.     If any biopsies were taken you will be contacted by phone or by letter within the next 1-3 weeks.  Please call us at [phone] if you have not heard about the biopsies in 3 weeks.    SIGNATURES/CONFIDENTIALITY: You and/or your care partner have signed paperwork which will be entered into your electronic medical record.  These signatures attest to the fact that that the information above on your After Visit Summary has been reviewed and is understood.  Full responsibility of the confidentiality of this discharge information lies with you and/or your care-partner.

## 2021-10-17 NOTE — Progress Notes (Signed)
VS by DT  Pt's states no medical or surgical changes since previsit or office visit.  

## 2021-10-17 NOTE — Progress Notes (Signed)
History and Physical Interval Note:  10/17/2021 3:04 PM  Patrick Kidd  has presented today for endoscopic procedure(s), with the diagnosis of  Encounter Diagnosis  Name Primary?   Alcoholic cirrhosis, unspecified whether ascites present ([place]) Yes  .  The various methods of evaluation and treatment have been discussed with the patient and/or family. After consideration of risks, benefits and other options for treatment, the patient has consented to  the endoscopic procedure(s).   The patient's history has been reviewed, patient examined, no change in status, stable for endoscopic procedure(s).  I have reviewed the patient's chart and labs.  Questions were answered to the patient's satisfaction.     Gatha Mayer, MD, [facility]

## 2021-10-18 ENCOUNTER — Telehealth: Payer: Self-pay | Admitting: *Deleted

## 2021-10-18 NOTE — Telephone Encounter (Signed)
Attempted to call patient for their post-procedure follow-up call. No answer. Left voicemail.   

## 2021-11-05 ENCOUNTER — Ambulatory Visit (INDEPENDENT_AMBULATORY_CARE_PROVIDER_SITE_OTHER): Payer: Medicare Other

## 2021-11-05 ENCOUNTER — Other Ambulatory Visit: Payer: Self-pay | Admitting: Internal Medicine

## 2021-11-05 ENCOUNTER — Encounter: Payer: Self-pay | Admitting: Internal Medicine

## 2021-11-05 ENCOUNTER — Ambulatory Visit (INDEPENDENT_AMBULATORY_CARE_PROVIDER_SITE_OTHER): Payer: Medicare Other | Admitting: Internal Medicine

## 2021-11-05 VITALS — BP 138/70 | HR 88 | Temp 98.6°F | Ht 74.0 in | Wt 237.0 lb

## 2021-11-05 DIAGNOSIS — Z23 Encounter for immunization: Secondary | ICD-10-CM | POA: Diagnosis not present

## 2021-11-05 DIAGNOSIS — N1831 Chronic kidney disease, stage 3a: Secondary | ICD-10-CM

## 2021-11-05 DIAGNOSIS — G8929 Other chronic pain: Secondary | ICD-10-CM

## 2021-11-05 DIAGNOSIS — M25551 Pain in right hip: Secondary | ICD-10-CM | POA: Diagnosis not present

## 2021-11-05 DIAGNOSIS — E519 Thiamine deficiency, unspecified: Secondary | ICD-10-CM

## 2021-11-05 DIAGNOSIS — N3281 Overactive bladder: Secondary | ICD-10-CM

## 2021-11-05 DIAGNOSIS — Z794 Long term (current) use of insulin: Secondary | ICD-10-CM | POA: Diagnosis not present

## 2021-11-05 DIAGNOSIS — I10 Essential (primary) hypertension: Secondary | ICD-10-CM

## 2021-11-05 DIAGNOSIS — G6289 Other specified polyneuropathies: Secondary | ICD-10-CM

## 2021-11-05 DIAGNOSIS — E118 Type 2 diabetes mellitus with unspecified complications: Secondary | ICD-10-CM | POA: Diagnosis not present

## 2021-11-05 LAB — MICROALBUMIN / CREATININE URINE RATIO
Creatinine,U: 110.2 mg/dL
Microalb Creat Ratio: 11.3 mg/g (ref 0.0–30.0)
Microalb, Ur: 12.4 mg/dL — ABNORMAL HIGH (ref 0.0–1.9)

## 2021-11-05 LAB — URINALYSIS, ROUTINE W REFLEX MICROSCOPIC
Ketones, ur: 40 — AB
Leukocytes,Ua: NEGATIVE
Nitrite: NEGATIVE
Specific Gravity, Urine: 1.02 (ref 1.000–1.030)
Total Protein, Urine: 30 — AB
Urine Glucose: >=1000 — AB
Urobilinogen, UA: 1 (ref 0.0–1.0)
pH: 6 (ref 5.0–8.0)

## 2021-11-05 LAB — HEMOGLOBIN A1C: Hgb A1c MFr Bld: 7 % — ABNORMAL HIGH (ref 4.6–6.5)

## 2021-11-05 MED ORDER — METFORMIN HCL 500 MG PO TABS: 500.0000 mg | ORAL_TABLET | Freq: Two times a day (BID) | ORAL | 1 refills | Status: DC

## 2021-11-05 MED ORDER — MIRABEGRON ER 50 MG PO TB24: 50.0000 mg | ORAL_TABLET | Freq: Every day | ORAL | 1 refills | Status: DC

## 2021-11-05 MED ORDER — SHINGRIX 50 MCG/0.5ML IM SUSR: 0.5000 mL | Freq: Once | INTRAMUSCULAR | 1 refills | Status: AC

## 2021-11-05 MED ORDER — GABAPENTIN 300 MG PO CAPS: 300.0000 mg | ORAL_CAPSULE | Freq: Three times a day (TID) | ORAL | 1 refills | Status: DC

## 2021-11-05 NOTE — Patient Instructions (Signed)
                                                                    [email or web address]                       

## 2021-11-05 NOTE — Progress Notes (Signed)
Subjective:  Patient ID: Patrick Kidd, male    DOB: March 29, 1945  Age: 76 y.o. MRN: [id number]  CC: Diabetes   HPI Patrick Kidd presents for f/up -  He complains of a one month hx of right hip pain - he points to the lower posterior buttocks - he denies T/I.  Outpatient Medications Prior to Visit  Medication Sig Dispense Refill   blood glucose meter kit and supplies KIT Use to test blood sugar once daily. DX E11.8 1 each 0   Colchicine (MITIGARE) 0.6 MG CAPS Take 1 tablet by mouth daily. 90 capsule 0   cyclobenzaprine (FLEXERIL) 10 MG tablet Take 1 tablet (10 mg total) by mouth 3 (three) times daily as needed for muscle spasms. 90 tablet 3   FARXIGA 10 MG TABS tablet Take 1 tablet (10 mg total) by mouth daily before breakfast. 90 tablet 1   glucose blood (COOL BLOOD GLUCOSE TEST STRIPS) test strip Use to check blood sugar daily. DX: E11.8 300 each 3   insulin glargine, 2 Unit Dial, (TOUJEO MAX SOLOSTAR) 300 UNIT/ML Solostar Pen Inject 50 Units into the skin daily. Via SANOFI pt assistance 15 mL 1   Lancets (ACCU-CHEK MULTICLIX) lancets Use to check sugar daily. DX: E11.8 100 each 12   metFORMIN (GLUCOPHAGE) 500 MG tablet Take 1 tablet (500 mg total) by mouth 2 (two) times daily with a meal. 180 tablet 1   pantoprazole (PROTONIX) 40 MG tablet TAKE 1 TABLET 30 MINUTES BEFORE MEALS (BREAKFAST AND SUPPER) TWICE A DAY 180 tablet 0   tadalafil (CIALIS) 20 MG tablet Take 1 tablet (20 mg total) by mouth daily as needed for erectile dysfunction. 6 tablet 3   vitamin B-12 (CYANOCOBALAMIN) 1000 MCG tablet Take 1,000 mcg by mouth daily.     Zinc Sulfate (ZINC 15 PO) Take by mouth.     gabapentin (NEURONTIN) 300 MG capsule Take 1 capsule (300 mg total) by mouth 3 (three) times daily. 270 capsule 3   Vibegron (GEMTESA) 75 MG TABS Take 1 tablet by mouth daily. 90 tablet 1   No facility-administered medications prior to visit.    ROS Review of Systems  Constitutional: Negative.  Negative  for diaphoresis and fatigue.  HENT: Negative.    Eyes: Negative.   Respiratory:  Negative for cough, chest tightness, shortness of breath and wheezing.   Cardiovascular:  Negative for chest pain, palpitations and leg swelling.  Gastrointestinal:  Negative for abdominal pain, diarrhea and nausea.  Endocrine: Negative.   Genitourinary:  Positive for frequency. Negative for difficulty urinating, dysuria and hematuria.  Musculoskeletal:  Positive for arthralgias. Negative for back pain.  Skin: Negative.   Neurological:  Positive for numbness. Negative for dizziness and weakness.       Pain/numbness/tingling in both feet  Hematological:  Negative for adenopathy. Does not bruise/bleed easily.  Psychiatric/Behavioral: Negative.      Objective:  BP 138/70 (BP Location: Left Arm, Patient Position: Sitting, Cuff Size: Large)   Pulse 88   Temp 98.6 F (37 C) (Oral)   Ht _0  (1.88 m)   Wt 237 lb (107.5 kg)   SpO2 92%   BMI 30.43 kg/m   BP Readings from Last 3 Encounters:  11/05/21 138/70  10/17/21 (!) 143/82  10/08/21 130/72    Wt Readings from Last 3 Encounters:  11/05/21 237 lb (107.5 kg)  10/17/21 243 lb (110.2 kg)  10/08/21 243 lb 9.6 oz (110.5 kg)    Physical Exam Vitals reviewed.  HENT:     Mouth/Throat:     Mouth: Mucous membranes are moist.  Eyes:     General: No scleral icterus.    Conjunctiva/sclera: Conjunctivae normal.  Cardiovascular:     Rate and Rhythm: Normal rate and regular rhythm.     Heart sounds: No murmur heard. Pulmonary:     Effort: Pulmonary effort is normal.     Breath sounds: No stridor. No wheezing, rhonchi or rales.  Abdominal:     General: Abdomen is flat.     Palpations: There is no mass.     Tenderness: There is no abdominal tenderness. There is no guarding.     Hernia: No hernia is present.  Musculoskeletal:        General: Normal range of motion.     Cervical back: Neck supple.     Right hip: Tenderness present.     Left hip:  Normal.     Right lower leg: No edema.     Left lower leg: No edema.     Comments: Right ttp over the IT  Lymphadenopathy:     Cervical: No cervical adenopathy.  Skin:    General: Skin is warm and dry.  Neurological:     General: No focal deficit present.     Mental Status: He is alert.     Lab Results  Component Value Date   WBC 8.1 10/08/2021   HGB 16.7 10/08/2021   HCT 48.3 10/08/2021   PLT 81.0 (L) 10/08/2021   GLUCOSE 198 (H) 10/08/2021   CHOL 162 05/21/2021   TRIG 122.0 05/21/2021   HDL 76.80 05/21/2021   LDLCALC 61 05/21/2021   ALT 8 10/08/2021   AST 16 10/08/2021   NA 137 10/08/2021   K 4.3 10/08/2021   CL 103 10/08/2021   CREATININE 0.88 10/08/2021   BUN 14 10/08/2021   CO2 25 10/08/2021   TSH 1.92 03/14/2020   PSA 0.2 05/15/2021   INR 0.9 10/08/2021   HGBA1C 7.0 (H) 11/05/2021   MICROALBUR 12.4 (H) 11/05/2021    US Abdomen Complete  Result Date: 08/17/2021 CLINICAL DATA:  76 year old male with cirrhosis. Known large hepatic cyst (6.3 cm last year). Possible renal mass, although normal kidneys on abdomen MRI last year. EXAM: ABDOMEN ULTRASOUND COMPLETE COMPARISON:  Abdomen MRI 08/14/2020. Noncontrast CT Abdomen and Pelvis 01/28/2019. Abdomen ultrasound 07/24/2020. FINDINGS: Gallbladder: Gallbladder wall thickness remains normal. No sonographic Murphy sign elicited. Small volume sludge and small layering gallstones suspected (7 mm image 10). Common bile duct: Diameter: Suboptimal visualization but estimated at 3 mm, normal. Liver: Chronic right hepatic lobe cyst with increased through transmission. Size approximately 6.5 cm (not significantly changed). No vascular elements on Doppler. No other discrete liver lesion. Coarse hepatic echotexture. Portal vein is patent on color Doppler imaging with normal direction of blood flow towards the liver. IVC: No abnormality visualized. Pancreas: Visualized portion unremarkable. Spleen: Volume estimated at 499 mL (normal splenic  volume range 83 - 412 mL). No discrete splenic lesion. No perisplenic fluid. Right Kidney: Length: 11.5 cm. Echogenicity within normal limits. No mass or hydronephrosis visualized. Left Kidney: Length: 12.6 cm.  Stable, negative. Abdominal aorta: No aneurysm visualized. Other findings: No ascites. IMPRESSION: 1. Chronic right hepatic lobe cyst. No other liver lesion by ultrasound. 2. Mild splenomegaly suggestive of portal venous hypertension. No ascites. 3. Small volume gallbladder sludge and small layering gallstones. No evidence of acute cholecystitis. 4. Kidneys appear stable and negative. Electronically Signed   By: Herminio Heads.D.  On: 08/17/2021 07:00   DG HIP UNILAT WITH PELVIS 2-3 VIEWS RIGHT  Result Date: 11/07/2021 CLINICAL DATA:  Right hip pain. Chronic pain of right hip. Patient reports pain for 6 months, history states pain for 1 month. EXAM: DG HIP (WITH OR WITHOUT PELVIS) 2-3V RIGHT COMPARISON:  Hip radiograph 12/21/2020, pelvis MRI 04/09/2021 FINDINGS: The hip joint spaces preserved. Mild degenerative spurring of the acetabulum. The femoral head is well seated. No fracture or avascular necrosis. No evidence of focal bone lesion. Intact pubic rami and bony pelvis. Pubic symphysis and sacroiliac joints are congruent, bilateral sacroiliac joint degenerative change. Brachytherapy seeds in the pelvis. IMPRESSION: Mild osteoarthritis of the right hip. Electronically Signed   By: Keith Rake M.D.   On: 11/07/2021 14:56   US Abdomen Complete  Result Date: 08/17/2021 CLINICAL DATA:  76 year old male with cirrhosis. Known large hepatic cyst (6.3 cm last year). Possible renal mass, although normal kidneys on abdomen MRI last year. EXAM: ABDOMEN ULTRASOUND COMPLETE COMPARISON:  Abdomen MRI 08/14/2020. Noncontrast CT Abdomen and Pelvis 01/28/2019. Abdomen ultrasound 07/24/2020. FINDINGS: Gallbladder: Gallbladder wall thickness remains normal. No sonographic Murphy sign elicited. Small volume sludge and  small layering gallstones suspected (7 mm image 10). Common bile duct: Diameter: Suboptimal visualization but estimated at 3 mm, normal. Liver: Chronic right hepatic lobe cyst with increased through transmission. Size approximately 6.5 cm (not significantly changed). No vascular elements on Doppler. No other discrete liver lesion. Coarse hepatic echotexture. Portal vein is patent on color Doppler imaging with normal direction of blood flow towards the liver. IVC: No abnormality visualized. Pancreas: Visualized portion unremarkable. Spleen: Volume estimated at 499 mL (normal splenic volume range 83 - 412 mL). No discrete splenic lesion. No perisplenic fluid. Right Kidney: Length: 11.5 cm. Echogenicity within normal limits. No mass or hydronephrosis visualized. Left Kidney: Length: 12.6 cm.  Stable, negative. Abdominal aorta: No aneurysm visualized. Other findings: No ascites. IMPRESSION: 1. Chronic right hepatic lobe cyst. No other liver lesion by ultrasound. 2. Mild splenomegaly suggestive of portal venous hypertension. No ascites. 3. Small volume gallbladder sludge and small layering gallstones. No evidence of acute cholecystitis. 4. Kidneys appear stable and negative. Electronically Signed   By: Genevie Ann M.D.   On: 08/17/2021 07:00     Assessment & Plan:   Nikai was seen today for diabetes.  Diagnoses and all orders for this visit:  Other polyneuropathy -     gabapentin (NEURONTIN) 300 MG capsule; Take 1 capsule (300 mg total) by mouth 3 (three) times daily.  Essential hypertension- His BP is well controlled. -     Urinalysis, Routine w reflex microscopic; Future -     Urinalysis, Routine w reflex microscopic  Type 2 diabetes mellitus with complication, with long-term current use of insulin ([place])- His blood sugar is adequately well controlled. -     Microalbumin / creatinine urine ratio; Future -     Hemoglobin A1c; Future -     Hemoglobin A1c -     Microalbumin / creatinine urine  ratio  Stage 3a chronic kidney disease ([place])- will avoid nsaids. -     Microalbumin / creatinine urine ratio; Future -     Urinalysis, Routine w reflex microscopic; Future -     Urinalysis, Routine w reflex microscopic -     Microalbumin / creatinine urine ratio  Thiamine deficiency  Chronic pain of right hip- c/w OA -     Cancel: DG Hip Unilat W OR W/O Pelvis Min 4 Views Right; Future  OAB (overactive bladder) -     mirabegron ER (MYRBETRIQ) 50 MG TB24 tablet; Take 1 tablet (50 mg total) by mouth daily.  Flu vaccine need -     Flu Vaccine QUAD High Dose(Fluad)  Other orders -     Zoster Vaccine Adjuvanted [facility]) injection; Inject 0.5 mLs into the muscle once for 1 dose.   I have discontinued Hollice Espy B. Reppucci "Bryan"'s Gemtesa. I am also having him start on Shingrix and mirabegron ER. Additionally, I am having him maintain his blood glucose meter kit and supplies, accu-chek multiclix, Cool Blood Glucose Test Strips, Colchicine, cyanocobalamin, tadalafil, Toujeo Max SoloStar, metFORMIN, pantoprazole, Zinc Sulfate (ZINC 15 PO), cyclobenzaprine, Farxiga, and gabapentin.  Meds ordered this encounter  Medications   gabapentin (NEURONTIN) 300 MG capsule    Sig: Take 1 capsule (300 mg total) by mouth 3 (three) times daily.    Dispense:  270 capsule    Refill:  1   Zoster Vaccine Adjuvanted [facility]) injection    Sig: Inject 0.5 mLs into the muscle once for 1 dose.    Dispense:  0.5 mL    Refill:  1   mirabegron ER (MYRBETRIQ) 50 MG TB24 tablet    Sig: Take 1 tablet (50 mg total) by mouth daily.    Dispense:  90 tablet    Refill:  1     Follow-up: Return in about 6 months (around 05/07/2022).  Scarlette Calico, MD

## 2021-11-06 ENCOUNTER — Telehealth: Payer: Self-pay

## 2021-11-06 NOTE — Telephone Encounter (Signed)
Patient is calling in wondering if someone can give him a call back about lab and xray results. Advised that labs were not resulted by Dr.John and that the Xray results have not come back yet and that it can take 24-48 hours.Patient verbalized understanding and will wait for [place] to call him.

## 2021-11-15 ENCOUNTER — Inpatient Hospital Stay (HOSPITAL_BASED_OUTPATIENT_CLINIC_OR_DEPARTMENT_OTHER): Payer: Medicare Other | Admitting: Hematology and Oncology

## 2021-11-15 ENCOUNTER — Inpatient Hospital Stay: Payer: Medicare Other | Attending: Hematology and Oncology

## 2021-11-15 ENCOUNTER — Other Ambulatory Visit: Payer: Self-pay | Admitting: Hematology and Oncology

## 2021-11-15 ENCOUNTER — Other Ambulatory Visit: Payer: Self-pay

## 2021-11-15 VITALS — BP 148/82 | HR 92 | Temp 98.2°F | Resp 17 | Wt 235.4 lb

## 2021-11-15 DIAGNOSIS — D72820 Lymphocytosis (symptomatic): Secondary | ICD-10-CM

## 2021-11-15 DIAGNOSIS — C9110 Chronic lymphocytic leukemia of B-cell type not having achieved remission: Secondary | ICD-10-CM | POA: Diagnosis not present

## 2021-11-15 DIAGNOSIS — Z79899 Other long term (current) drug therapy: Secondary | ICD-10-CM | POA: Diagnosis not present

## 2021-11-15 DIAGNOSIS — D6959 Other secondary thrombocytopenia: Secondary | ICD-10-CM | POA: Diagnosis not present

## 2021-11-15 DIAGNOSIS — D696 Thrombocytopenia, unspecified: Secondary | ICD-10-CM

## 2021-11-15 LAB — CBC WITH DIFFERENTIAL (CANCER CENTER ONLY)
Abs Immature Granulocytes: 0.02 10*3/uL (ref 0.00–0.07)
Basophils Absolute: 0 10*3/uL (ref 0.0–0.1)
Basophils Relative: 0 %
Eosinophils Absolute: 0 10*3/uL (ref 0.0–0.5)
Eosinophils Relative: 1 %
HCT: 46.1 % (ref 39.0–52.0)
Hemoglobin: 16.5 g/dL (ref 13.0–17.0)
Immature Granulocytes: 0 %
Lymphocytes Relative: 59 %
Lymphs Abs: 4.6 10*3/uL — ABNORMAL HIGH (ref 0.7–4.0)
MCH: 33.6 pg (ref 26.0–34.0)
MCHC: 35.8 g/dL (ref 30.0–36.0)
MCV: 93.9 fL (ref 80.0–100.0)
Monocytes Absolute: 0.4 10*3/uL (ref 0.1–1.0)
Monocytes Relative: 6 %
Neutro Abs: 2.6 10*3/uL (ref 1.7–7.7)
Neutrophils Relative %: 34 %
Platelet Count: 85 10*3/uL — ABNORMAL LOW (ref 150–400)
RBC: 4.91 MIL/uL (ref 4.22–5.81)
RDW: 13.8 % (ref 11.5–15.5)
WBC Count: 7.7 10*3/uL (ref 4.0–10.5)
nRBC: 0 % (ref 0.0–0.2)

## 2021-11-15 LAB — CMP (CANCER CENTER ONLY)
ALT: 10 U/L (ref 0–44)
AST: 20 U/L (ref 15–41)
Albumin: 4.2 g/dL (ref 3.5–5.0)
Alkaline Phosphatase: 61 U/L (ref 38–126)
Anion gap: 7 (ref 5–15)
BUN: 10 mg/dL (ref 8–23)
CO2: 31 mmol/L (ref 22–32)
Calcium: 9 mg/dL (ref 8.9–10.3)
Chloride: 105 mmol/L (ref 98–111)
Creatinine: 1.02 mg/dL (ref 0.61–1.24)
GFR, Estimated: >60 mL/min (ref >60)
Glucose, Bld: 137 mg/dL — ABNORMAL HIGH (ref 70–99)
Potassium: 4.3 mmol/L (ref 3.5–5.1)
Sodium: 143 mmol/L (ref 135–145)
Total Bilirubin: 1.2 mg/dL (ref 0.3–1.2)
Total Protein: 6.8 g/dL (ref 6.5–8.1)

## 2021-11-15 LAB — IMMATURE PLATELET FRACTION: Immature Platelet Fraction: 4.7 % (ref 1.2–8.6)

## 2021-11-15 NOTE — Progress Notes (Signed)
[place] [phone]   [phone]  PROGRESS NOTE  Patient Care Team: [name], MD as PCP - General (Internal Medicine) Lorretta Harp, MD as PCP - Cardiology (Cardiology) Pieter Partridge, DO as Consulting Physician (Neurology) Tomasa Blase, [facility] (Inactive) (Pharmacist)  Hematological/Oncological History # Chronic Lymphocytic Leukemia (CLL) Rai Stage 0 # Thrombocytopenia 2/2 to Cirrhosis 10/04/2020: establish care with Dr. Lorenso Courier. WBC 9.7, ALC 5100. Flow cytometry showed a B-cell lymphoproliferative process  and may represent monoclonal B-cell lymphocytosis  Interval History:  Patrick Kidd 76 y.o. male with medical history significant for CLL who presents for a follow up visit. The patient's last visit was on 05/15/2021. In the interim since the last visit he has been in his normal state of health.   On exam today Patrick Kidd reports he went to his primary doctor approximate 2 weeks ago for some hip pain.  An x-ray performed which did not show any abnormalities.  He reports that hip pain is worse when laying down he has to lay a certain way and has difficulty rolling over.  He notes that ever since he had COVID he has had issues with his joints.  He notes that he also does struggle with fatigue at this time.  He has not been having any bleeding but does have some occasional bruising.  He bumps or hits his arms he does develop a bruise.  He reports that fatigue is his most frequent symptom at this time.  He notes he is not having any issues with fevers, chills, sweats, nausea, vomiting or diarrhea.  He denies any lymphadenopathy.  Full 10 point ROS is listed below.  MEDICAL HISTORY:  Past Medical History:  Diagnosis Date   Acrophobia    Alcoholic cirrhosis ([place]) 41/63/8453   Anemia    Arthritis    foot by big toe   BPH associated with nocturia    Cataract    removed both eyes   Chronic cough    PMH of   CLL (chronic lymphocytic  leukemia) ([place])    COVID-19 12/2018   Diabetes mellitus without complication ([place])    Diverticulosis 07/03/2010   Colonoscopy.    Duodenal ulcer 2017   Fallen arches    Bilateral   GERD (gastroesophageal reflux disease)    Gout    Granuloma annulare    Hx of adenomatous colonic polyps multiple   Hydrocele 2011   Large septated right hydrocele   Liver cyst    Liver lesion    Nonspecific elevation of levels of transaminase or lactic acid dehydrogenase (LDH)    Obesity    Peripheral neuropathy    Plantar fasciitis    PMH of   Portal hypertension ([place]) 2017   Prostate cancer ([place])    Right shoulder pain 11/2017   Sleep apnea    no cpap, patient denies   Thiamine deficiency    Wears reading eyeglasses    WPW (Wolff-Parkinson-White syndrome) 02/05/2019    SURGICAL HISTORY: Past Surgical History:  Procedure Laterality Date   CATARACT EXTRACTION, BILATERAL  12/2011    Dr Gershon Crane   COLONOSCOPY  2017   COLONOSCOPY W/ POLYPECTOMY  07/03/2010   2 adenomas, diverticulosis on right. Dr [name]   CYSTOSCOPY N/A 03/15/2018   Procedure: [name];  Surgeon: [name], MD;  Location: [facility] [place];  Service: Urology;  Laterality: N/A;  NO SEEDS FOUND IN BLADDER   ESOPHAGOGASTRODUODENOSCOPY (EGD) WITH PROPOFOL N/A 01/08/2016  Procedure: ESOPHAGOGASTRODUODENOSCOPY (EGD) WITH PROPOFOL;  Surgeon: [name], MD;  Location[facility];  Service: Endoscopy;  Laterality: N/A;   [place]   RADIOACTIVE SEED IMPLANT N/A 03/15/2018   Procedure: RADIOACTIVE SEED IMPLANT/BRACHYTHERAPY IMPLANT;  Surgeon: [name], MD;  Location: [place];  Service: Urology;  Laterality: N/A;   73 SEEDS IMPLANTED   SIGMOIDOSCOPY     SPACE OAR INSTILLATION N/A 03/15/2018   Procedure: SPACE OAR INSTILLATION;  Surgeon: [name], MD;  Location[facility];  Service: Urology;  Laterality: N/A;   WISDOM TOOTH  EXTRACTION      SOCIAL HISTORY: Social History   Socioeconomic History   Marital status: Married    Spouse name: [name]   Number of children: 2   Years of education: Not on file   Highest education level: Some college, no degree  Occupational History   Occupation: Adult nurse estate  Tobacco Use   Smoking status: Former    Packs/day: 2.00    Years: 6.00    Total pack years: 12.00    Types: Cigarettes    Quit date: 02/04/1971    Years since quitting: 50.8    Passive exposure: Never   Smokeless tobacco: Never   Tobacco comments:    smoked age [age], up to 2 ppd  Vaping Use   Vaping Use: Never used  Substance and Sexual Activity   Alcohol use: Not Currently    Comment: No alochol since Christmas Day 2020   Drug use: No   Sexual activity: Yes    Partners: Female  Other Topics Concern   Not on file  Social History Narrative   Worked in Adult nurse estate   2 children   Former smoker no drug use   Alcoholism, abstinent since 01/23/2019 most recently   Fun/Hobby: Play golf, grandchildren, [employer]      Patient is left-handed. He lives with his wife in a one level home. He swims most days.   Social Determinants of Health   Financial Resource Strain: Medium Risk (04/19/2020)   Overall Financial Resource Strain (CARDIA)    Difficulty of Paying Living Expenses: Somewhat hard  Food Insecurity: No Food Insecurity (10/08/2017)   Hunger Vital Sign    Worried About Running Out of Food in the Last Year: Never true    Ran Out of Food in the Last Year: Never true  Transportation Needs: No Transportation Needs (10/08/2017)   PRAPARE - Hydrologist (Medical): No    Lack of Transportation (Non-Medical): No  Physical Activity: Insufficiently Active (10/08/2017)   Exercise Vital Sign    Days of Exercise per Week: 1 day    Minutes of Exercise per Session: 50 min  Stress: No Stress Concern Present (10/08/2017)   [place]    Feeling of Stress : Not at all  Social Connections: Unknown (10/08/2017)   Social Connection and Isolation Panel [NHANES]    Frequency of Communication with Friends and Family: More than three times a week    Frequency of Social Gatherings with Friends and Family: More than three times a week    Attends Religious Services: Not on file    Active Member of Clubs or Organizations: Not on file    Attends Archivist Meetings: Not on file    Marital Status: Married  Intimate Partner Violence: Not At Risk (10/08/2017)   Humiliation, Afraid,  Rape, and Kick questionnaire    Fear of Current or Ex-Partner: No    Emotionally Abused: No    Physically Abused: No    Sexually Abused: No    FAMILY HISTORY: Family History  Problem Relation Age of Onset   Lung cancer Mother        smoker   Leukemia Father        Acute myelocytic   Diabetes Neg Hx    Stroke Neg Hx    Heart disease Neg Hx    Colon cancer Neg Hx    Colon polyps Neg Hx    Esophageal cancer Neg Hx    Rectal cancer Neg Hx    Stomach cancer Neg Hx     ALLERGIES:  is allergic to rifaximin, beta adrenergic blockers, and calcium channel blockers.  MEDICATIONS:  Current Outpatient Medications  Medication Sig Dispense Refill   blood glucose meter kit and supplies KIT Use to test blood sugar once daily. DX E11.8 1 each 0   Colchicine (MITIGARE) 0.6 MG CAPS Take 1 tablet by mouth daily. 90 capsule 0   cyclobenzaprine (FLEXERIL) 10 MG tablet Take 1 tablet (10 mg total) by mouth 3 (three) times daily as needed for muscle spasms. 90 tablet 3   FARXIGA 10 MG TABS tablet Take 1 tablet (10 mg total) by mouth daily before breakfast. 90 tablet 1   gabapentin (NEURONTIN) 300 MG capsule Take 1 capsule (300 mg total) by mouth 3 (three) times daily. 270 capsule 1   glucose blood (COOL BLOOD GLUCOSE TEST STRIPS) test strip Use to check blood sugar daily. DX: E11.8 300 each 3   insulin glargine, 2 Unit Dial,  (TOUJEO MAX SOLOSTAR) 300 UNIT/ML Solostar Pen Inject 50 Units into the skin daily. Via SANOFI pt assistance 15 mL 1   Lancets (ACCU-CHEK MULTICLIX) lancets Use to check sugar daily. DX: E11.8 100 each 12   metFORMIN (GLUCOPHAGE) 500 MG tablet Take 1 tablet (500 mg total) by mouth 2 (two) times daily with a meal. 180 tablet 1   mirabegron ER (MYRBETRIQ) 50 MG TB24 tablet Take 1 tablet (50 mg total) by mouth daily. 90 tablet 1   pantoprazole (PROTONIX) 40 MG tablet TAKE 1 TABLET 30 MINUTES BEFORE MEALS (BREAKFAST AND SUPPER) TWICE A DAY 180 tablet 0   tadalafil (CIALIS) 20 MG tablet Take 1 tablet (20 mg total) by mouth daily as needed for erectile dysfunction. 6 tablet 3   vitamin B-12 (CYANOCOBALAMIN) 1000 MCG tablet Take 1,000 mcg by mouth daily.     Zinc Sulfate (ZINC 15 PO) Take by mouth.     No current facility-administered medications for this visit.    REVIEW OF SYSTEMS:   Constitutional: ( - ) fevers, ( - )  chills , ( - ) night sweats Eyes: ( - ) blurriness of vision, ( - ) double vision, ( - ) watery eyes Ears, nose, mouth, throat, and face: ( - ) mucositis, ( - ) sore throat Respiratory: ( - ) cough, ( - ) dyspnea, ( - ) wheezes Cardiovascular: ( - ) palpitation, ( - ) chest discomfort, ( - ) lower extremity swelling Gastrointestinal:  ( - ) nausea, ( - ) heartburn, ( - ) change in bowel habits Skin: ( - ) abnormal skin rashes Lymphatics: ( - ) new lymphadenopathy, ( - ) easy bruising Neurological: ( - ) numbness, ( - ) tingling, ( - ) new weaknesses Behavioral/Psych: ( - ) mood change, ( - ) new changes  All other systems were reviewed with the patient and are negative.  PHYSICAL EXAMINATION: ECOG PERFORMANCE STATUS: 1 - Symptomatic but completely ambulatory  Vitals:   11/15/21 1349  BP: (!) 148/82  Pulse: 92  Resp: 17  Temp: 98.2 F (36.8 C)  SpO2: 94%   Filed Weights   11/15/21 1349  Weight: 235 lb 6.4 oz (106.8 kg)    GENERAL: Well-appearing elderly Caucasian  male, alert, no distress and comfortable SKIN: skin color, texture, turgor are normal, no rashes or significant lesions EYES: conjunctiva are pink and non-injected, sclera clear NECK: supple, non-tender LYMPH:  no palpable lymphadenopathy in the cervical, axillary or inguinal LUNGS: clear to auscultation and percussion with normal breathing effort HEART: regular rate & rhythm and no murmurs and no lower extremity edema Musculoskeletal: no cyanosis of digits and no clubbing  PSYCH: alert & oriented x 3, fluent speech NEURO: no focal motor/sensory deficits  LABORATORY DATA:  I have reviewed the data as listed    Latest Ref Rng & Units 11/15/2021    1:25 PM 10/08/2021   11:12 AM 05/15/2021    1:52 PM  CBC  WBC 4.0 - 10.5 K/uL 7.7  8.1  10.3   Hemoglobin 13.0 - 17.0 g/dL 16.5  16.7  17.1   Hematocrit 39.0 - 52.0 % 46.1  48.3  48.3   Platelets 150 - 400 K/uL 85  81.0  76        Latest Ref Rng & Units 11/15/2021    1:25 PM 10/08/2021   11:12 AM 05/15/2021    1:52 PM  CMP  Glucose 70 - 99 mg/dL 137  198  114   BUN 8 - 23 mg/dL _0 Creatinine 0.61 - 1.24 mg/dL 1.02  0.88  0.74   Sodium 135 - 145 mmol/L 143  137  141   Potassium 3.5 - 5.1 mmol/L 4.3  4.3  4.1   Chloride 98 - 111 mmol/L 105  103  108   CO2 22 - 32 mmol/L _1 Calcium 8.9 - 10.3 mg/dL 9.0  9.0  8.9   Total Protein 6.5 - 8.1 g/dL 6.8  6.6  7.3   Total Bilirubin 0.3 - 1.2 mg/dL 1.2  1.1  1.1   Alkaline Phos 38 - 126 U/L 61  63  57   AST 15 - 41 U/L _2 ALT 0 - 44 U/L _3 RADIOGRAPHIC STUDIES: I have personally reviewed the radiological images as listed and agreed with the findings in the report. DG HIP UNILAT WITH PELVIS 2-3 VIEWS RIGHT  Result Date: 11/07/2021 CLINICAL DATA:  Right hip pain. Chronic pain of right hip. Patient reports pain for 6 months, history states pain for 1 month. EXAM: DG HIP (WITH OR WITHOUT PELVIS) 2-3V RIGHT COMPARISON:  Hip radiograph 12/21/2020, pelvis MRI  04/09/2021 FINDINGS: The hip joint spaces preserved. Mild degenerative spurring of the acetabulum. The femoral head is well seated. No fracture or avascular necrosis. No evidence of focal bone lesion. Intact pubic rami and bony pelvis. Pubic symphysis and sacroiliac joints are congruent, bilateral sacroiliac joint degenerative change. Brachytherapy seeds in the pelvis. IMPRESSION: Mild osteoarthritis of the right hip. Electronically Signed   By: Keith Rake M.D.   On: 11/07/2021 14:56    ASSESSMENT & PLAN Patrick Kidd 76 y.o. male with medical history significant for CLL who presents for  a follow up visit.  At this time the findings are most consistent with CLL.  The patient has a monoclonal B-cell lymphocytosis, however the absolute lymphocyte count is greater than 5000 therefore would meet diagnostic criteria for CLL.  He does not have a leukocytosis and does not have any apparent lymphadenopathy or splenomegaly.  He does have thrombocytopenia which is likely secondary to his known liver disease.  Therefore this is a Rai stage 0 lymphocytosis.  Previously I discussed with the patient the nature of CLL and treatment options moving forward.  Given that this is early stage and he does not have any concerning cytopenias or obstructive lymphadenopathy I recommend he continue with observation at this time.  We will order genetic testing in order to determine how aggressive this leukemia is.  In many cases CLL does not require treatment as it is a slow progressing malignancy.  At this time I recommend observation with return to clinic in approximately 6 months time to reevaluate.  # Chronic Lymphocytic Leukemia (CLL) Rai Stage 0 --Findings are consistent with chronic lymphocytic leukemia.  He is currently Rai stage 0. --No evidence of lymphadenopathy or splenomegaly at this time --No clear indication to start treatment --CLL prognostic panel showed del 13, IGVH (inconclusive), and Zap 70  (borderline)  --labs today show WBC 7.7, Hgb 16.5, MCV 93.9, Plt 85 --Plan for return to clinic in 12 months time to continue observation.  # Thrombocytopenia --patient noted to have cirrhotic morphology of the liver on [place] from 07/24/2020 --stronger evidence this represents thrombocytopenia 2/2 to liver disease and not from the CLL --continue to monitor   No orders of the defined types were placed in this encounter.   All questions were answered. The patient knows to call the clinic with any problems, questions or concerns.  A total of more than 30 minutes were spent on this encounter with face-to-face time and non-face-to-face time, including preparing to see the patient, ordering tests and/or medications, counseling the patient and coordination of care as outlined above.   Ledell Peoples, MD Department of Hematology/Oncology [place] at [facility] Phone: [phone] Pager: [phone] Email: [email or web address]  11/15/2021 2:04 PM

## 2021-11-19 ENCOUNTER — Telehealth: Payer: Self-pay

## 2021-11-19 NOTE — Telephone Encounter (Signed)
Patient is calling in stating that he got the results from XRAY, showed osteoarthritis. Patient wants to know what to do about this as the pain is traveling to his knee now. Will be heading out of town tomorrow afternoon, would like a call back as quickly as possible.

## 2021-11-20 ENCOUNTER — Encounter: Payer: Self-pay | Admitting: Surgery

## 2021-11-20 ENCOUNTER — Ambulatory Visit: Payer: Self-pay

## 2021-11-20 ENCOUNTER — Ambulatory Visit (INDEPENDENT_AMBULATORY_CARE_PROVIDER_SITE_OTHER): Payer: Medicare Other | Admitting: Surgery

## 2021-11-20 ENCOUNTER — Other Ambulatory Visit: Payer: Self-pay | Admitting: Internal Medicine

## 2021-11-20 VITALS — BP 152/85 | HR 92 | Ht 74.0 in | Wt 235.0 lb

## 2021-11-20 DIAGNOSIS — R29818 Other symptoms and signs involving the nervous system: Secondary | ICD-10-CM | POA: Diagnosis not present

## 2021-11-20 DIAGNOSIS — G8929 Other chronic pain: Secondary | ICD-10-CM

## 2021-11-20 DIAGNOSIS — M545 Low back pain, unspecified: Secondary | ICD-10-CM

## 2021-11-20 DIAGNOSIS — M170 Bilateral primary osteoarthritis of knee: Secondary | ICD-10-CM

## 2021-11-20 DIAGNOSIS — M4726 Other spondylosis with radiculopathy, lumbar region: Secondary | ICD-10-CM

## 2021-11-20 NOTE — Telephone Encounter (Signed)
LVM to discuss

## 2021-11-20 NOTE — Telephone Encounter (Signed)
Patient saw Orthopedic today, they advised Dr.Jones to prescribe low dose of Prednisone for his knee.

## 2021-11-20 NOTE — Progress Notes (Signed)
Office Visit Note   Patient: Patrick Kidd           Date of Birth: [birth year]           MRN: [id number] Visit Date: 11/20/2021              Requested by: [name], MD [address] [place],  [place] [address] PCP: [name], MD   Assessment & Plan: Visit Diagnoses:  1. Chronic right-sided low back pain, unspecified whether sciatica present   2. Other spondylosis with radiculopathy, lumbar region     Plan: Advised patient that with his ongoing problem that has failed conservative treatment I recommend that he meet our spine surgeon Dr. Laurance Flatten to discuss treatment options.  He will follow-up with him next Wednesday.  All questions answered.  Follow-Up Instructions: Return in about 1 week (around 11/27/2021) for With Dr. Laurance Flatten as scheduled to discuss treatment options lumbar.   Orders:  Orders Placed This Encounter  Procedures  . XR Lumbar Spine Complete   No orders of the defined types were placed in this encounter.     Procedures: No procedures performed   Clinical Data: No additional findings.   Subjective: Chief Complaint  Patient presents with  . Right Leg - Pain  . Lower Back - Pain    HPI 76 year old male comes in with complaints of chronic back pain and bilateral lower extremity radiculopathy.  Patient has seen multiple providers for this including a neurologist.  Patient states that he has been told that he has neuropathy which is causing lower leg issues.  He is also seen a couple of primary care sports medicine providers. he has had previous treatment with lumbar ESI and is taking gabapentin.  Lumbar MRI scan performed April 09, 2021 and that study showed:  EXAM: MRI LUMBAR SPINE WITHOUT CONTRAST   TECHNIQUE: Multiplanar, multisequence MR imaging of the lumbar spine was performed. No intravenous contrast was administered.   COMPARISON:  Lumbar radiographs 12/21/2020   FINDINGS: Segmentation:  5 lumbar vertebra.   Alignment: Mild  retrolisthesis L1-2. Mild anterolisthesis L3-4. Mild retrolisthesis L4-5   Vertebrae: Negative for fracture or mass. Negative for metastatic disease.   Conus medullaris and cauda equina: Conus extends to the T12-L1 level. Conus and cauda equina appear normal.   Paraspinal and other soft tissues: Negative for paraspinous mass, adenopathy, or fluid collection.   Disc levels:   Image quality degraded by motion throughout the study   T12-L1: Negative   L1-2: Mild retrolisthesis. Mild facet degeneration. Negative for stenosis   L2-3: Disc degeneration with disc bulging and spurring. Moderate facet degeneration. Mild spinal stenosis. Mild subarticular stenosis bilaterally.   L3-4: Mild disc bulging. Advanced facet degeneration. Moderate spinal stenosis. Moderate subarticular stenosis bilaterally.   L4-5: Disc degeneration with diffuse endplate spurring. Moderate facet hypertrophy bilaterally. Moderate spinal stenosis. Moderate to severe subarticular stenosis on the left due to spurring and possible disc protrusion. Mild to moderate right subarticular stenosis   L5-S1: Mild disc degeneration. Advanced facet degeneration bilaterally. Bilateral subarticular stenosis with S1 nerve root impingement right greater than left.   IMPRESSION: Motion degraded study   Negative for fracture or metastatic disease   Lumbar spondylosis. Multilevel spinal and foraminal stenosis as detailed above.     Electronically Signed   By: Franchot Gallo M.D.   On: 04/09/2021 19:25 Review of Systems No current complaints of cardiopulmonary GI/GU issues  Objective: Vital Signs: BP (!) 152/85   Pulse 92  Ht '6\' 2"'$  (1.88 m)   Wt 235 lb (106.6 kg)   BMI 30.17 kg/m   Physical Exam HENT:     Head: Normocephalic and atraumatic.     Nose: Nose normal.  Eyes:     Extraocular Movements: Extraocular movements intact.  Pulmonary:     Effort: No respiratory distress.  Musculoskeletal:      Comments: Pleasant male alert and oriented in no acute distress.  Patient has bilateral lumbar paraspinal tenderness/spasm.  Negative log about his.  Positive bilateral straight leg raise.  Neurological:     Mental Status: He is alert and oriented to person, place, and time.  Psychiatric:        Mood and Affect: Mood normal.   Ortho Exam  Specialty Comments:  No specialty comments available.  Imaging: No results found.   PMFS History: Patient Active Problem List   Diagnosis Date Noted  . Chronic pain of right hip 11/05/2021  . Urinary incontinence 06/25/2021  . Degenerative lumbar spinal stenosis 04/30/2021  . Degenerative arthritis of knee, bilateral 03/27/2021  . Chronic lymphocytic leukemia ([place]) 11/08/2020  . Insulin-requiring or dependent type II diabetes mellitus ([place]) 09/25/2020  . Stage 3a chronic kidney disease ([place]) 03/15/2020  . Thiamine deficiency   . Paroxysmal atrial fibrillation ([place]) 07/19/2019  . Allergic rhinitis 06/09/2019  . Erectile dysfunction due to diabetes mellitus ([place]) 06/09/2019  . Overweight with body mass index (BMI) of 28 to 28.9 in adult 03/17/2019  . WPW (Wolff-Parkinson-White syndrome) 02/05/2019  . SBP (spontaneous bacterial peritonitis) (HCC)-December 2020,    . Encephalopathy, hepatic ([place])   . Degenerative disc disease, cervical 07/21/2018  . Cervical radiculopathy 07/16/2018  . OAB (overactive bladder) 07/06/2018  . Malignant neoplasm of prostate ([place]) 01/13/2018  . Hyperlipidemia LDL goal <100 11/11/2017  . Essential hypertension 11/11/2017  . BPH associated with nocturia 11/11/2017  . Erectile dysfunction due to arterial insufficiency 04/07/2017  . Peripheral vascular disease ([place]) 06/25/2016  . Alcoholic cirrhosis ([place]) 38/75/6433  . Type 2 diabetes mellitus with complication, with long-term current use of insulin ([place]) 08/22/2015  . Hypersomnolence 03/19/2014  . Peripheral neuropathy 01/11/2013  . Polyclonal gammopathy  01/11/2013  . GERD 10/04/2009   Past Medical History:  Diagnosis Date  . Acrophobia   . Alcoholic cirrhosis ([place]) 29/51/8841  . Anemia   . Arthritis    foot by big toe  . BPH associated with nocturia   . Cataract    removed both eyes  . Chronic cough    PMH of  . CLL (chronic lymphocytic leukemia) ([place])   . COVID-19 12/2018  . Diabetes mellitus without complication ([place])   . Diverticulosis 07/03/2010   Colonoscopy.   . Duodenal ulcer 2017  . Fallen arches    Bilateral  . GERD (gastroesophageal reflux disease)   . Gout   . Granuloma annulare   . Hx of adenomatous colonic polyps multiple  . Hydrocele 2011   Large septated right hydrocele  . Liver cyst   . Liver lesion   . Nonspecific elevation of levels of transaminase or lactic acid dehydrogenase (LDH)   . Obesity   . Peripheral neuropathy   . Plantar fasciitis    PMH of  . Portal hypertension ([place]) 2017  . Prostate cancer ([place])   . Right shoulder pain 11/2017  . Sleep apnea    no cpap, patient denies  . Thiamine deficiency   . Wears reading eyeglasses   . WPW (Wolff-Parkinson-White syndrome) 02/05/2019    Family  History  Problem Relation Age of Onset  . Lung cancer Mother        smoker  . Leukemia Father        Acute myelocytic  . Diabetes Neg Hx   . Stroke Neg Hx   . Heart disease Neg Hx   . Colon cancer Neg Hx   . Colon polyps Neg Hx   . Esophageal cancer Neg Hx   . Rectal cancer Neg Hx   . Stomach cancer Neg Hx     Past Surgical History:  Procedure Laterality Date  . CATARACT EXTRACTION, BILATERAL  12/2011    Dr Gershon Crane  . COLONOSCOPY  2017  . COLONOSCOPY W/ POLYPECTOMY  07/03/2010   2 adenomas, diverticulosis on right. Dr [name]  . CYSTOSCOPY N/A 03/15/2018   Procedure: CYSTOSCOPY FLEXIBLE;  Surgeon: [name], MD;  Location: [facility];  Service: Urology;  Laterality: N/A;  NO SEEDS FOUND IN BLADDER  . ESOPHAGOGASTRODUODENOSCOPY (EGD) WITH PROPOFOL N/A 01/08/2016    Procedure: ESOPHAGOGASTRODUODENOSCOPY (EGD) WITH PROPOFOL;  Surgeon: [name], MD;  [facility];  Service: Endoscopy;  Laterality: N/A;  . FLEXIBLE SIGMOIDOSCOPY  2000  . RADIOACTIVE SEED IMPLANT N/A 03/15/2018   Procedure: RADIOACTIVE SEED IMPLANT/BRACHYTHERAPY IMPLANT;  Surgeon: [name], MD;  Location: [facility];  Service: Urology;  Laterality: N/A;   73 SEEDS IMPLANTED  . SIGMOIDOSCOPY    . SPACE OAR INSTILLATION N/A 03/15/2018   Procedure: SPACE OAR INSTILLATION;  Surgeon: [name], MD;  Location: [facility];  Service: Urology;  Laterality: N/A;  . WISDOM TOOTH EXTRACTION     Social History   Occupational History  . Occupation: Adult nurse estate  Tobacco Use  . Smoking status: Former    Packs/day: 2.00    Years: 6.00    Total pack years: 12.00    Types: Cigarettes    Quit date: 02/04/1971    Years since quitting: 50.8    Passive exposure: Never  . Smokeless tobacco: Never  . Tobacco comments:    smoked age [age], up to 2 ppd  Vaping Use  . Vaping Use: Never used  Substance and Sexual Activity  . Alcohol use: Not Currently    Comment: No alochol since Christmas Day 2020  . Drug use: No  . Sexual activity: Yes    Partners: Female

## 2021-11-27 ENCOUNTER — Ambulatory Visit (INDEPENDENT_AMBULATORY_CARE_PROVIDER_SITE_OTHER): Payer: Medicare Other | Admitting: Orthopedic Surgery

## 2021-11-27 VITALS — BP 170/75 | HR 79 | Ht 74.0 in | Wt 235.0 lb

## 2021-11-27 DIAGNOSIS — M5416 Radiculopathy, lumbar region: Secondary | ICD-10-CM

## 2021-11-27 NOTE — Progress Notes (Signed)
Orthopedic Spine Surgery Office Note  Assessment: Patient is a 76 y.o. male with lumbar stenosis at multiple levels with right leg radicular pain   Plan: -Explained that initially conservative treatment is tried as a significant number of patients may experience relief with these treatment modalities. Discussed that the conservative treatments include:  -activity modification  -physical therapy  -over the counter pain medications  -medrol dosepak  -lumbar steroid injections -Patient has tried activity modification, ibuprofen -Patient inquired about another injection to help with this progressive pain on the posterior aspect of his right thigh.  A referral was provided to him today. -He was also interested in physical therapy which I think he will be better able to participate with after an injectio and his pain is under better control. He has gotten relief with physical therapy in the past. A referral was provided to him today.  -He was interested in hearing about the surgical option.  I explained that there are significant risks with surgery especially since he has a history of cirrhosis.  His BMI would also increase his risk of surgical complication.  Since his pain is mostly relieved with ibuprofen, I recommended continued conservative treatment. -In regards to his imbalance, he has no physical exam findings of hyperreflexia in his upper or lower extremities.  He has negative Hoffmann, negative grip and release test, no interosseous wasting, and he has no symptoms of hand dexterity or coordination issues.  I do not feel that this is due to myelopathy.  He has a long history of imbalance and has significant neuropathy.  Feel that this may be the etiology of some of his imbalance.  He is already established with neurology and was going to call to get another appointment to work this up further. -Patient should return to office in 12 weeks, repeat x-rays of lumbar spine at next visit:  none   Patient expressed understanding of the plan and all questions were answered to the patient's satisfaction.   ___________________________________________________________________________   History:  Patient is a 76 y.o. male who presents today for lumbar spine.  Patient has had about a year of lower back pain that radiates into the right buttock and right posterior thigh.  There is no trauma or injury that brought on the pain.  Pain has changed over time.  He had an injection back in April and that helps with the pain in the region of the lower back and buttock.  After the injection no is when he developed the right posterior thigh pain that goes to just distal to the knee.  That pain has gotten progressively worse with time.  He states that is worse when laying flat in bed.  He has to lay on his side in order to alleviate the pain.  It also hurts if he is active.  He does better with sitting.  He states that the pain is relieved with 600 mg of Advil twice daily.   Weakness: denies Symptoms of imbalance: yes, feels unsteady and has trouble going up stairs Paresthesias and numbness: yes, in bilateral feet and hands Bowel or bladder incontinence: denies Saddle anesthesia: denies  Treatments tried: advil, activity modification  Review of systems: Denies fevers and chills, night sweats, unexplained weight loss, history of cancer, pain that wakes them at night  Past medical history: Cirrhosis BPH Anemia CLL Diabetes Diverticulosis GERD Duodenal ulcer Hydrocele Peripheral neuropathy Portal HTN Wolff-Parkinson-White syndrome Thiamine deficiency Sleep apnea Prostate cancer  Allergies: rifaximin, beta blockers, calcium channel blockers  Past surgical history:  Cataract surgery Polypectomy Radioactive seed implant Wisdom tooth extraction  Social history: Denies use of nicotine product (smoking, vaping, patches, smokeless) Alcohol use: denies Denies recreational drug  use   Physical Exam:  General: no acute distress, appears stated age Neurologic: alert, answering questions appropriately, following commands Respiratory: unlabored breathing on room air, symmetric chest rise Psychiatric: appropriate affect, normal cadence to speech   MSK (spine):  -Strength exam      Left  Right EHL    5/5  5/5 TA    5/5  5/5 GSC    5/5  5/5 Knee extension  5/5  5/5 Hip flexion   5/5  5/5  5/5 strength with hip abduction bilaterally. Negative active piriformis test  -Sensory exam    Sensation intact to light touch in L3-S1 nerve distributions of bilateral lower extremities (decreased sensation distal to midshaft tibia bilaterally - almost completely numb)  Sensation intact to light touch in C5-T1 distributions bilaterally (though decreased distal to wrist bilaterally)  -Achilles DTR: 0/4 on the left, 0/4 on the right -Patellar tendon DTR: 0/4 on the left, 0/4 on the right  -Straight leg raise: negative -Contralateral straight leg raise: negative -Clonus: no beats bilaterally  -Left hip exam: no pain through range of motion, negative FABER, negative stinchfield -Right hip exam: no pain through range of motion, negative FABER, negative stinchfield  Unable to detect when I was dorsiflexing or plantarflexing his toe when his eyes were closed  Negative hoffman bilaterally. 1/4 reflexes in his biceps and brachioradialis bilaterally. Negative grip and release test. Had instability with gait.   Imaging: XR of the lumbar spine from 11/20/2021 was independently reviewed and interpreted, showing disc height loss at multiple levels in the lumbar spine. Anterior osteophyte formation most prominently at L3/4 and L4/5. No fracture or dislocation. No evidence of instability on flexion/extension.   MRI of the lumbar spine from 04/09/2021 was independently reviewed and interpreted, showing DDD multiple levels. Lateral recess and central stenosis at L3/4. Lateral recess  stenosis at L2/3, L4/5 and L5/S1.    Patient name: Patrick Kidd Patient MRN: [id number] Date of visit: 11/27/21

## 2021-11-28 NOTE — Progress Notes (Signed)
Subjective:   Patrick Kidd is a 76 y.o. male who presents for Medicare Annual/Subsequent preventive examination. I connected with  CYRIS MAALOUF on 12/02/21 by a audio enabled telemedicine application and verified that I am speaking with the correct person using two identifiers.  Patient Location: Home  Provider Location: Home Office  I discussed the limitations of evaluation and management by telemedicine. The patient expressed understanding and agreed to proceed.  Review of Systems    Deferred to PCP Cardiac Risk Factors include: advanced age (>18mn, >>56women);diabetes mellitus;dyslipidemia;hypertension;male gender     Objective:    Today's Vitals   12/02/21 1321  PainSc: 5    There is no height or weight on file to calculate BMI.     12/02/2021    1:28 PM 08/27/2021    1:27 PM 04/04/2021    9:50 AM 08/26/2019    1:57 PM 01/28/2019    5:31 PM 08/24/2018    2:22 PM 07/26/2018   10:10 AM  Advanced Directives  Does Patient Have a Medical Advance Directive? Yes No No Yes No No No  Type of [place]   [place]    Does patient want to make changes to medical advance directive? No - Patient declined        Copy of HTwilightin Chart? Yes - validated most recent copy scanned in chart (See row information)        Would patient like information on creating a medical advance directive?   No - Patient declined  Yes (ED - Information included in AVS) No - Patient declined No - Patient declined    Current Medications (verified) Outpatient Encounter Medications as of 12/02/2021  Medication Sig   blood glucose meter kit and supplies KIT Use to test blood sugar once daily. DX E11.8   Colchicine (MITIGARE) 0.6 MG CAPS Take 1 tablet by mouth daily.   FARXIGA 10 MG TABS tablet Take 1 tablet (10 mg total) by mouth daily before breakfast.   gabapentin (NEURONTIN) 300 MG capsule Take 1 capsule  (300 mg total) by mouth 3 (three) times daily.   glucose blood (COOL BLOOD GLUCOSE TEST STRIPS) test strip Use to check blood sugar daily. DX: E11.8   insulin glargine, 2 Unit Dial, (TOUJEO MAX SOLOSTAR) 300 UNIT/ML Solostar Pen Inject 50 Units into the skin daily. Via SANOFI pt assistance   Lancets (ACCU-CHEK MULTICLIX) lancets Use to check sugar daily. DX: E11.8   metFORMIN (GLUCOPHAGE) 500 MG tablet Take 1 tablet (500 mg total) by mouth 2 (two) times daily with a meal.   pantoprazole (PROTONIX) 40 MG tablet TAKE 1 TABLET 30 MINUTES BEFORE MEALS (BREAKFAST AND SUPPER) TWICE A DAY   tadalafil (CIALIS) 20 MG tablet Take 1 tablet (20 mg total) by mouth daily as needed for erectile dysfunction.   vitamin B-12 (CYANOCOBALAMIN) 1000 MCG tablet Take 1,000 mcg by mouth daily.   Zinc Sulfate (ZINC 15 PO) Take by mouth.   cyclobenzaprine (FLEXERIL) 10 MG tablet Take 1 tablet (10 mg total) by mouth 3 (three) times daily as needed for muscle spasms. (Patient not taking: Reported on 12/02/2021)   mirabegron ER (MYRBETRIQ) 50 MG TB24 tablet Take 1 tablet (50 mg total) by mouth daily. (Patient not taking: Reported on 12/02/2021)   No facility-administered encounter medications on file as of 12/02/2021.    Allergies (verified) Rifaximin, Beta adrenergic blockers, and Calcium channel blockers   History: Past  Medical History:  Diagnosis Date   Acrophobia    Alcoholic cirrhosis ([place]) 51/88/4166   Anemia    Arthritis    foot by big toe   BPH associated with nocturia    Cataract    removed both eyes   Chronic cough    PMH of   CLL (chronic lymphocytic leukemia) ([place])    COVID-19 12/2018   Diabetes mellitus without complication ([place])    Diverticulosis 07/03/2010   Colonoscopy.    Duodenal ulcer 2017   Fallen arches    Bilateral   GERD (gastroesophageal reflux disease)    Gout    Granuloma annulare    Hx of adenomatous colonic polyps multiple   Hydrocele 2011   Large septated right hydrocele    Liver cyst    Liver lesion    Nonspecific elevation of levels of transaminase or lactic acid dehydrogenase (LDH)    Obesity    Peripheral neuropathy    Plantar fasciitis    PMH of   Portal hypertension ([place]) 2017   Prostate cancer ([place])    Right shoulder pain 11/2017   Sleep apnea    no cpap, patient denies   Thiamine deficiency    Wears reading eyeglasses    WPW (Wolff-Parkinson-White syndrome) 02/05/2019   Past Surgical History:  Procedure Laterality Date   CATARACT EXTRACTION, BILATERAL  12/2011    Dr Gershon Crane   COLONOSCOPY  2017   COLONOSCOPY W/ POLYPECTOMY  07/03/2010   2 adenomas, diverticulosis on right. Dr [name]   CYSTOSCOPY N/A 03/15/2018   Procedure: [name];  Surgeon: [name], MD;  Location: [facility];  Service: Urology;  Laterality: N/A;  NO SEEDS FOUND IN BLADDER   ESOPHAGOGASTRODUODENOSCOPY (EGD) WITH PROPOFOL N/A 01/08/2016   Procedure: ESOPHAGOGASTRODUODENOSCOPY (EGD) WITH PROPOFOL;  Surgeon: [name], MD;  Location: [facility];  Service: Endoscopy;  Laterality: N/A;   [place]   RADIOACTIVE SEED IMPLANT N/A 03/15/2018   Procedure: RADIOACTIVE SEED IMPLANT/BRACHYTHERAPY IMPLANT;  Surgeon: [name], MD;  Location: [facility];  Service: Urology;  Laterality: N/A;   72 SEEDS IMPLANTED   SIGMOIDOSCOPY     SPACE OAR INSTILLATION N/A 03/15/2018   Procedure: SPACE OAR INSTILLATION;  Surgeon: [name], MD;  Location: [facility];  Service: Urology;  Laterality: N/A;   WISDOM TOOTH EXTRACTION     Family History  Problem Relation Age of Onset   Lung cancer Mother        smoker   Leukemia Father        Acute myelocytic   Diabetes Neg Hx    Stroke Neg Hx    Heart disease Neg Hx    Colon cancer Neg Hx    Colon polyps Neg Hx    Esophageal cancer Neg Hx    Rectal cancer Neg Hx    Stomach cancer Neg Hx    Social History   Socioeconomic History    Marital status: Married    Spouse name: [name]   Number of children: 2   Years of education: Not on file   Highest education level: Some college, no degree  Occupational History   Occupation: Adult nurse estate  Tobacco Use   Smoking status: Former    Packs/day: 2.00    Years: 6.00    Total pack years: 12.00    Types: Cigarettes    Quit date: 02/04/1971    Years since quitting: 50.8  Passive exposure: Never   Smokeless tobacco: Never   Tobacco comments:    smoked age [age], up to 2 ppd  Vaping Use   Vaping Use: Never used  Substance and Sexual Activity   Alcohol use: Not Currently    Comment: No alochol since Christmas Day 2020   Drug use: No   Sexual activity: Yes    Partners: Female  Other Topics Concern   Not on file  Social History Narrative   Worked in Adult nurse estate   2 children   Former smoker no drug use   Alcoholism, abstinent since 01/23/2019 most recently   Fun/Hobby: Play golf, grandchildren, [employer]      Patient is left-handed. He lives with his wife in a one level home. He swims most days.   Social Determinants of Health   Financial Resource Strain: Low Risk  (12/02/2021)   Overall Financial Resource Strain (CARDIA)    Difficulty of Paying Living Expenses: Not hard at all  Food Insecurity: No Food Insecurity (12/02/2021)   Hunger Vital Sign    Worried About Running Out of Food in the Last Year: Never true    Ran Out of Food in the Last Year: Never true  Transportation Needs: No Transportation Needs (12/02/2021)   PRAPARE - Hydrologist (Medical): No    Lack of Transportation (Non-Medical): No  Physical Activity: Inactive (12/02/2021)   Exercise Vital Sign    Days of Exercise per Week: 0 days    Minutes of Exercise per Session: 0 min  Stress: No Stress Concern Present (12/02/2021)   [place]    Feeling of Stress : Not at all  Social  Connections: Moderately Integrated (12/02/2021)   Social Connection and Isolation Panel [NHANES]    Frequency of Communication with Friends and Family: More than three times a week    Frequency of Social Gatherings with Friends and Family: More than three times a week    Attends Religious Services: Never    Marine scientist or Organizations: Yes    Attends Music therapist: More than 4 times per year    Marital Status: Married    Tobacco Counseling Counseling given: Not Answered Tobacco comments: smoked age [age], up to 2 ppd   Clinical Intake:  Pre-visit preparation completed: Yes  Pain : 0-10 Pain Score: 5  Pain Type: Chronic pain Pain Location: Back (sciatica) Pain Orientation: Right Pain Descriptors / Indicators: Aching, Discomfort, Radiating Pain Relieving Factors: Advil  Pain Relieving Factors: Advil  Nutritional Status: BMI > 30  Obese Nutritional Risks: None Diabetes: Yes CBG done?: No (phone visit) Did pt. bring in CBG monitor from home?: No (phone visit)  How often do you need to have someone help you when you read instructions, pamphlets, or other written materials from your doctor or pharmacy?: 1 - Never What is the last grade level you completed in school?: college  Diabetic?Yes Nutrition Risk Assessment:  Has the patient had any N/V/D within the last 2 months?  No  Does the patient have any non-healing wounds?  No  Has the patient had any unintentional weight loss or weight gain?  No   Diabetes:  Is the patient diabetic?  Yes  If diabetic, was a CBG obtained today?  No , phone visit Did the patient bring in their glucometer from home?  No , phone visit How often do you monitor your CBG's? weekly.  Financial Strains and Diabetes Management:  Are you having any financial strains with the device, your supplies or your medication? No .  Does the patient want to be seen by Chronic Care Management for management of their diabetes?  No   Would the patient like to be referred to a Nutritionist or for Diabetic Management?  No   Diabetic Exams:  Diabetic Eye Exam: Completed 05/16/21 Diabetic Foot Exam: Overdue, Pt has been advised about the importance in completing this exam. Pt is scheduled for diabetic foot exam on Deferred to PCP.   Interpreter Needed?: No  Information entered by :: Emelia Loron RN   Activities of Daily Living    12/02/2021    1:28 PM  In your present state of health, do you have any difficulty performing the following activities:  Hearing? 0  Vision? 0  Difficulty concentrating or making decisions? 0  Walking or climbing stairs? 0  Dressing or bathing? 0  Doing errands, shopping? 0  Preparing Food and eating ? N  Using the Toilet? N  In the past six months, have you accidently leaked urine? N  Do you have problems with loss of bowel control? N  Managing your Medications? N  Managing your Finances? N  Housekeeping or managing your Housekeeping? N    Patient Care Team: [name], MD as PCP - General (Internal Medicine) Lorretta Harp, MD as PCP - Cardiology (Cardiology) Pieter Partridge, DO as Consulting Physician (Neurology) [place], Darnelle Maffucci, [facility] (Inactive) (Pharmacist)  Indicate any recent Medical Services you may have received from other than [facility] providers in the past year (date may be approximate).     Assessment:   This is a routine wellness examination for [place].  Hearing/Vision screen No results found.  Dietary issues and exercise activities discussed: Current Exercise Habits: The patient does not participate in regular exercise at present, Exercise limited by: orthopedic condition(s)   Goals Addressed             This Visit's Progress    Patient Stated       Work with PT to improve sciatica pain so that I can increase my physical activity.       Depression Screen    12/02/2021    1:27 PM 11/05/2021    2:31 PM 09/25/2020    1:59 PM 04/14/2019    2:19 PM  03/14/2019   10:07 AM 10/08/2017    1:38 PM 10/02/2016    2:41 PM  PHQ 2/9 Scores  PHQ - 2 Score 0 0 0 0 0 0 0  PHQ- 9 Score  0         Fall Risk    08/27/2021    1:26 PM 09/25/2020    1:59 PM 08/26/2019    1:57 PM 04/14/2019    2:18 PM 08/24/2018    2:22 PM  [place] in the past year? 0 0 1 1 1   Number falls in past yr: 0 0 0 1 0  Injury with Fall? 0 0 0 1   Follow up     Falls evaluation completed    FALL RISK PREVENTION PERTAINING TO THE HOME:  Any stairs in or around the home? Yes  If so, are there any without handrails? Yes  Home free of loose throw rugs in walkways, pet beds, electrical cords, etc? Yes  Adequate lighting in your home to reduce risk of falls? Yes   ASSISTIVE DEVICES UTILIZED TO PREVENT FALLS:  Life alert? No  Use of a cane, walker or w/c? No  Grab bars in the bathroom? Yes  Shower chair or bench in shower? No  Elevated toilet seat or a handicapped toilet? No   Cognitive Function:        12/02/2021    1:28 PM  6CIT Screen  What Year? 0 points  What month? 0 points  What time? 0 points  Count back from 20 0 points  Months in reverse 0 points  Repeat phrase 0 points  Total Score 0 points    Immunizations Immunization History  Administered Date(s) Administered   Fluad Quad(high Dose 65+) 01/30/2019, 11/23/2019, 11/29/2020, 11/05/2021   Hep A / Hep B 11/02/2015   Influenza, High Dose Seasonal PF 10/28/2016, 11/11/2017   Influenza,inj,Quad PF,6+ Mos 01/10/2016   Moderna SARS-COV2 Booster Vaccination 12/13/2019   Moderna Sars-Covid-2 Vaccination 02/13/2019, 03/16/2019   Pneumococcal Conjugate-13 09/04/2015   Pneumococcal Polysaccharide-23 06/05/2011, 03/14/2019   Tdap 06/03/2009, 10/28/2016, 06/28/2019    TDAP status: Up to date  Flu Vaccine status: Up to date  Pneumococcal vaccine status: Up to date  Covid-19 vaccine status: Information provided on how to obtain vaccines.   Qualifies for Shingles Vaccine? Yes   Zostavax  completed No   Shingrix Completed?: No.    Education has been provided regarding the importance of this vaccine. Patient has been advised to call insurance company to determine out of pocket expense if they have not yet received this vaccine. Advised may also receive vaccine at local pharmacy or Health Dept. Verbalized acceptance and understanding.  Screening Tests Health Maintenance  Topic Date Due   Zoster Vaccines- Shingrix (1 of 2) Never done   COVID-19 Vaccine (3 - Moderna risk series) 01/10/2020   FOOT EXAM  09/25/2021   HEMOGLOBIN A1C  05/07/2022   OPHTHALMOLOGY EXAM  05/17/2022   Diabetic kidney evaluation - Urine ACR  11/06/2022   Diabetic kidney evaluation - GFR measurement  11/16/2022   Medicare Annual Wellness (AWV)  12/03/2022   TETANUS/TDAP  06/27/2029   Pneumonia Vaccine 73+ Years old  Completed   INFLUENZA VACCINE  Completed   Hepatitis C Screening  Completed   HPV VACCINES  Aged Out   COLONOSCOPY (Pts 45-41yr Insurance coverage will need to be confirmed)  Discontinued    Health Maintenance  Health Maintenance Due  Topic Date Due   Zoster Vaccines- Shingrix (1 of 2) Never done   COVID-19 Vaccine (3 - Moderna risk series) 01/10/2020   FOOT EXAM  09/25/2021    Colorectal cancer screening: Type of screening: Colonoscopy. Completed 03/31/19. Repeat every never years  Lung Cancer Screening: (Low Dose CT Chest recommended if Age 77-80years, 30 pack-year currently smoking OR have quit w/in 15years.) does not qualify.   Additional Screening:  Hepatitis C Screening: does qualify; Completed 01/29/19  Vision Screening: Recommended annual ophthalmology exams for early detection of glaucoma and other disorders of the eye. Is the patient up to date with their annual eye exam?  Yes  Who is the provider or what is the name of the office in which the patient attends annual eye exams? Dr. SGershon CraneIf pt is not established with a provider, would they like to be referred to a  provider to establish care?  N/A .   Dental Screening: Recommended annual dental exams for proper oral hygiene  Community Resource Referral / Chronic Care Management: CRR required this visit?  No   CCM required this visit?  No  Plan:     I have personally reviewed and noted the following in the patient's chart:   Medical and social history Use of alcohol, tobacco or illicit drugs  Current medications and supplements including opioid prescriptions. Patient is not currently taking opioid prescriptions. Functional ability and status Nutritional status Physical activity Advanced directives List of other physicians Hospitalizations, surgeries, and ER visits in previous 12 months Vitals Screenings to include cognitive, depression, and falls Referrals and appointments  In addition, I have reviewed and discussed with patient certain preventive protocols, quality metrics, and best practice recommendations. A written personalized care plan for preventive services as well as general preventive health recommendations were provided to patient.     Michiel Cowboy, RN   12/02/2021   Nurse Notes:  Mr. Miler , Thank you for taking time to come for your Medicare Wellness Visit. I appreciate your ongoing commitment to your health goals. Please review the following plan we discussed and let me know if I can assist you in the future.   These are the goals we discussed:  Goals      Monitor and Manage My Blood Sugar-Diabetes Type 2     Timeframe:  Long-Range Goal Priority:  High Start Date:   03/07/2021                          Expected End Date:   03/07/2022                    Follow Up Date 09/04/2021   - check blood sugar at prescribed times - check blood sugar if I feel it is too high or too low - enter blood sugar readings and medication or insulin into daily log - take the blood sugar log to all doctor visits - take the blood sugar meter to all doctor visits    Why is this  important?   Checking your blood sugar at home helps to keep it from getting very high or very low.  Writing the results in a diary or log helps the doctor know how to care for you.  Your blood sugar log should have the time, date and the results.  Also, write down the amount of insulin or other medicine that you take.  Other information, like what you ate, exercise done and how you were feeling, will also be helpful.     Notes:      patient     Wants to feel better      Patient Stated     Work with PT to improve sciatica pain so that I can increase my physical activity.         This is a list of the screening recommended for you and due dates:  Health Maintenance  Topic Date Due   Zoster (Shingles) Vaccine (1 of 2) Never done   COVID-19 Vaccine (3 - Moderna risk series) 01/10/2020   Complete foot exam   09/25/2021   Hemoglobin A1C  05/07/2022   Eye exam for diabetics  05/17/2022   Yearly kidney health urinalysis for diabetes  11/06/2022   Yearly kidney function blood test for diabetes  11/16/2022   Medicare Annual Wellness Visit  12/03/2022   Tetanus Vaccine  06/27/2029   Pneumonia Vaccine  Completed   Flu Shot  Completed   Hepatitis C Screening: USPSTF Recommendation to screen - Ages 18-79 yo.  Completed   HPV Vaccine  Aged Out  Colon Cancer Screening  Discontinued

## 2021-11-28 NOTE — Patient Instructions (Signed)
Health Maintenance, Male Adopting a healthy lifestyle and getting preventive care are important in promoting health and wellness. Ask your health care provider about: The right schedule for you to have regular tests and exams. Things you can do on your own to prevent diseases and keep yourself healthy. What should I know about diet, weight, and exercise? Eat a healthy diet  Eat a diet that includes plenty of vegetables, fruits, low-fat dairy products, and lean protein. Do not eat a lot of foods that are high in solid fats, added sugars, or sodium. Maintain a healthy weight Body mass index (BMI) is a measurement that can be used to identify possible weight problems. It estimates body fat based on height and weight. Your health care provider can help determine your BMI and help you achieve or maintain a healthy weight. Get regular exercise Get regular exercise. This is one of the most important things you can do for your health. Most adults should: Exercise for at least 150 minutes each week. The exercise should increase your heart rate and make you sweat (moderate-intensity exercise). Do strengthening exercises at least twice a week. This is in addition to the moderate-intensity exercise. Spend less time sitting. Even light physical activity can be beneficial. Watch cholesterol and blood lipids Have your blood tested for lipids and cholesterol at 77 years of age, then have this test every 5 years. You may need to have your cholesterol levels checked more often if: Your lipid or cholesterol levels are high. You are older than 77 years of age. You are at high risk for heart disease. What should I know about cancer screening? Many types of cancers can be detected early and may often be prevented. Depending on your health history and family history, you may need to have cancer screening at various ages. This may include screening for: Colorectal cancer. Prostate cancer. Skin cancer. Lung  cancer. What should I know about heart disease, diabetes, and high blood pressure? Blood pressure and heart disease High blood pressure causes heart disease and increases the risk of stroke. This is more likely to develop in people who have high blood pressure readings or are overweight. Talk with your health care provider about your target blood pressure readings. Have your blood pressure checked: Every 3-5 years if you are 18-39 years of age. Every year if you are 40 years old or older. If you are between the ages of 65 and 75 and are a current or former smoker, ask your health care provider if you should have a one-time screening for abdominal aortic aneurysm (AAA). Diabetes Have regular diabetes screenings. This checks your fasting blood sugar level. Have the screening done: Once every three years after age 45 if you are at a normal weight and have a low risk for diabetes. More often and at a younger age if you are overweight or have a high risk for diabetes. What should I know about preventing infection? Hepatitis B If you have a higher risk for hepatitis B, you should be screened for this virus. Talk with your health care provider to find out if you are at risk for hepatitis B infection. Hepatitis C Blood testing is recommended for: Everyone born from 1945 through 1965. Anyone with known risk factors for hepatitis C. Sexually transmitted infections (STIs) You should be screened each year for STIs, including gonorrhea and chlamydia, if: You are sexually active and are younger than 77 years of age. You are older than 77 years of age and your   health care provider tells you that you are at risk for this type of infection. Your sexual activity has changed since you were last screened, and you are at increased risk for chlamydia or gonorrhea. Ask your health care provider if you are at risk. Ask your health care provider about whether you are at high risk for HIV. Your health care provider  may recommend a prescription medicine to help prevent HIV infection. If you choose to take medicine to prevent HIV, you should first get tested for HIV. You should then be tested every 3 months for as long as you are taking the medicine. Follow these instructions at home: Alcohol use Do not drink alcohol if your health care provider tells you not to drink. If you drink alcohol: Limit how much you have to 0-2 drinks a day. Know how much alcohol is in your drink. In the [place] one drink equals one 12 oz bottle of beer (355 mL), one 5 oz glass of Raheen Capili (148 mL), or one 1 oz glass of hard liquor (44 mL). Lifestyle Do not use any products that contain nicotine or tobacco. These products include cigarettes, chewing tobacco, and vaping devices, such as e-cigarettes. If you need help quitting, ask your health care provider. Do not use street drugs. Do not share needles. Ask your health care provider for help if you need support or information about quitting drugs. General instructions Schedule regular health, dental, and eye exams. Stay current with your vaccines. Tell your health care provider if: You often feel depressed. You have ever been abused or do not feel safe at home. Summary Adopting a healthy lifestyle and getting preventive care are important in promoting health and wellness. Follow your health care provider's instructions about healthy diet, exercising, and getting tested or screened for diseases. Follow your health care provider's instructions on monitoring your cholesterol and blood pressure. This information is not intended to replace advice given to you by your health care provider. Make sure you discuss any questions you have with your health care provider. Document Revised: 06/11/2020 Document Reviewed: 06/11/2020 Elsevier Patient Education  2023 [employer]  

## 2021-12-02 ENCOUNTER — Ambulatory Visit (INDEPENDENT_AMBULATORY_CARE_PROVIDER_SITE_OTHER): Payer: Medicare Other | Admitting: *Deleted

## 2021-12-02 DIAGNOSIS — Z0000 Encounter for general adult medical examination without abnormal findings: Secondary | ICD-10-CM

## 2021-12-05 ENCOUNTER — Telehealth: Payer: Self-pay | Admitting: Internal Medicine

## 2021-12-05 ENCOUNTER — Telehealth: Payer: Medicare Other

## 2021-12-05 NOTE — Telephone Encounter (Signed)
Patient said he never got call for pharmacist appointment today and just asked for a call back at [phone]

## 2021-12-06 ENCOUNTER — Telehealth: Payer: Self-pay | Admitting: Physical Medicine and Rehabilitation

## 2021-12-06 NOTE — Telephone Encounter (Signed)
Pt called about his upcoming appt. Pt states he got a call about weather he was approved for this injection and pt is confused about weather he was approved or not. Pt phone number is [phone].

## 2021-12-06 NOTE — Therapy (Signed)
OUTPATIENT PHYSICAL THERAPY  EVALUATION   Patient Name: Patrick Kidd MRN: [id number] DOB:1945-08-19, 76 y.o., male Today's Date: 12/10/2021  END OF SESSION:   PT End of Session - 12/10/21 1341     Visit Number 1    Number of Visits 20    Date for PT Re-Evaluation 02/18/22    Authorization Type Medicare and BCBS    Progress Note Due on Visit 10    PT Start Time 1062    PT Stop Time 1425    PT Time Calculation (min) 36 min    Activity Tolerance Patient tolerated treatment well    Behavior During Therapy WFL for tasks assessed/performed             Past Medical History:  Diagnosis Date   Acrophobia    Alcoholic cirrhosis ([place]) 69/48/5462   Anemia    Arthritis    foot by big toe   BPH associated with nocturia    Cataract    removed both eyes   Chronic cough    PMH of   CLL (chronic lymphocytic leukemia) ([place])    COVID-19 12/2018   Diabetes mellitus without complication ([place])    Diverticulosis 07/03/2010   Colonoscopy.    Duodenal ulcer 2017   Fallen arches    Bilateral   GERD (gastroesophageal reflux disease)    Gout    Granuloma annulare    Hx of adenomatous colonic polyps multiple   Hydrocele 2011   Large septated right hydrocele   Liver cyst    Liver lesion    Nonspecific elevation of levels of transaminase or lactic acid dehydrogenase (LDH)    Obesity    Peripheral neuropathy    Plantar fasciitis    PMH of   Portal hypertension ([place]) 2017   Prostate cancer ([place])    Right shoulder pain 11/2017   Sleep apnea    no cpap, patient denies   Thiamine deficiency    Wears reading eyeglasses    WPW (Wolff-Parkinson-White syndrome) 02/05/2019   Past Surgical History:  Procedure Laterality Date   CATARACT EXTRACTION, BILATERAL  12/2011    Dr Gershon Crane   COLONOSCOPY  2017   COLONOSCOPY W/ POLYPECTOMY  07/03/2010   2 adenomas, diverticulosis on right. Dr [name]   CYSTOSCOPY N/A 03/15/2018   Procedure: [name];  Surgeon: [name], MD;  Location: [facility];  Service: Urology;  Laterality: N/A;  NO SEEDS FOUND IN BLADDER   ESOPHAGOGASTRODUODENOSCOPY (EGD) WITH PROPOFOL N/A 01/08/2016   Procedure: ESOPHAGOGASTRODUODENOSCOPY (EGD) WITH PROPOFOL;  Surgeon: [name], MD;  Location: [facility];  Service: Endoscopy;  Laterality: N/A;   [place]   RADIOACTIVE SEED IMPLANT N/A 03/15/2018   Procedure: RADIOACTIVE SEED IMPLANT/BRACHYTHERAPY IMPLANT;  Surgeon: [name], MD;  Location: [place];  Service: Urology;  Laterality: N/A;   73 SEEDS IMPLANTED   SIGMOIDOSCOPY     SPACE OAR INSTILLATION N/A 03/15/2018   Procedure: SPACE OAR INSTILLATION;  Surgeon: [name], MD;  Location[facility];  Service: Urology;  Laterality: N/A;   WISDOM TOOTH EXTRACTION     Patient Active Problem List   Diagnosis Date Noted   Chronic pain of right hip 11/05/2021   Urinary incontinence 06/25/2021   Degenerative lumbar spinal stenosis 04/30/2021   Degenerative arthritis of knee, bilateral 03/27/2021   Chronic lymphocytic leukemia ([place]) 11/08/2020   Insulin-requiring or dependent type II diabetes mellitus ([place]) 09/25/2020  Stage 3a chronic kidney disease ([place]) 03/15/2020   Thiamine deficiency    Paroxysmal atrial fibrillation ([place]) 07/19/2019   Allergic rhinitis 06/09/2019   Erectile dysfunction due to diabetes mellitus ([place]) 06/09/2019   Overweight with body mass index (BMI) of 28 to 28.9 in adult 03/17/2019   WPW (Wolff-Parkinson-White syndrome) 02/05/2019   SBP (spontaneous bacterial peritonitis) ([facility])-December 2020,     Encephalopathy, hepatic ([place])    Degenerative disc disease, cervical 07/21/2018   Cervical radiculopathy 07/16/2018   OAB (overactive bladder) 07/06/2018   Malignant neoplasm of prostate ([place]) 01/13/2018   Hyperlipidemia LDL goal <100 11/11/2017   Essential hypertension 11/11/2017   BPH associated with nocturia 11/11/2017    Erectile dysfunction due to arterial insufficiency 04/07/2017   Peripheral vascular disease ([place]) 27/07/2374   Alcoholic cirrhosis ([place]) 28/31/5176   Type 2 diabetes mellitus with complication, with long-term current use of insulin ([place]) 08/22/2015   Hypersomnolence 03/19/2014   Peripheral neuropathy 01/11/2013   Polyclonal gammopathy 01/11/2013   GERD 10/04/2009    PCP: [name]. MD  REFERRING PROVIDER: Callie Fielding, MD  REFERRING DIAG: M54.16 (ICD-10-CM) - Radiculopathy, lumbar region  Rationale for Evaluation and Treatment: Rehabilitation  THERAPY DIAG:  Other low back pain  Pain in right leg  Difficulty in walking, not elsewhere classified  Muscle weakness (generalized)  ONSET DATE: Aug 2023  SUBJECTIVE:                                                                                                                                                                                           SUBJECTIVE STATEMENT: Pt indicated complaints in Rt buttock down Rt leg to knee.  Pt indicated it used to be just with lying on side.  Having some numbness in lowering.  Plan to get injection tomorrow. Pt indicated having some mobility problems and some balance problems.  Pt indicated symptoms worse in afternoon/night.   PERTINENT HISTORY:  History of injection in April 2023.  Had therapy prior to injection with some improvements.  DM, GERD, Gout, peripherial neuropathy, history of prostate cancer, WPW syrndrome  PAIN:  NPRS scale: 0/10 current, low moderate /10 Pain location: Rt buttock/posterior leg Pain description: achy, numbness Aggravating factors: lying, prolonged standing (unsure of timeline), stairs Relieving factors: sitting  PRECAUTIONS: None  WEIGHT BEARING RESTRICTIONS: No  FALLS:  Has patient fallen in last 6 months? No  LIVING ENVIRONMENT: Lives in: House/apartment Stairs: 1 stair (uses door)   PLOF: Independent, golf (mobility problem limited -  last time approx. 3 years), travel for vacation for boat  PATIENT GOALS: Reduce pain, improve mobility   OBJECTIVE:  DIAGNOSTIC FINDINGS:  12/10/2021 review of chart:  XR of the lumbar spine from 11/20/2021 was independently reviewed and interpreted, showing disc height loss at multiple levels in the lumbar spine. Anterior osteophyte formation most prominently at L3/4 and L4/5. No fracture or dislocation. No evidence of instability on flexion/extension.    MRI of the lumbar spine from 04/09/2021 was independently reviewed and interpreted, showing DDD multiple levels. Lateral recess and central stenosis at L3/4. Lateral recess stenosis at L2/3, L4/5 and L5/S1.   PATIENT SURVEYS:  12/10/2021 FOTO eval:  49  predicted:  62  SCREENING FOR RED FLAGS: 12/10/2021 Bowel or bladder incontinence: No Cauda equina syndrome: No  COGNITION: 12/10/2021 Overall cognitive status: WFL normal      SENSATION: 12/10/2021 No specific testing today  MUSCLE LENGTH: 12/10/2021 Hamstrings: Right 60 deg; Left 60 deg   POSTURE: 12/10/2021  Very mild lateral truck shift to Lt in standing, easily corrected with no change in symptoms noted.   PALPATION: 12/10/2021 No specific tenderness in lumbar region to touch    LUMBAR ROM:   AROM 12/10/2021  Flexion To knee joint, tightness in hamstrings noted  Extension 50 % WFL c mild buttock pain   Repeated x 5 in standing, improved to 75% c continued catch of buttock pain noted  -- no specific centralization or peripheralization noted  Right lateral flexion To lateral knee jt no pain  Left lateral flexion To lateral knee jt no pain  Right rotation   Left rotation    (Blank rows = not tested)  LOWER EXTREMITY ROM:      Right 12/10/2021 Left 12/10/2021  Hip flexion    Hip extension    Hip abduction    Hip adduction    Hip internal rotation AROM WFL no complaint AROM WFL no complaints  Hip external rotation    Knee flexion    Knee extension    Ankle  dorsiflexion    Ankle plantarflexion    Ankle inversion    Ankle eversion     (Blank rows = not tested)  LOWER EXTREMITY MMT:    MMT Right 12/10/2021 Left 12/10/2021  Hip flexion    Hip extension Check next visit Check next visit  Hip abduction Check next visit Check next visit  Hip adduction    Hip internal rotation    Hip external rotation    Knee flexion    Knee extension    Ankle dorsiflexion    Ankle plantarflexion    Ankle inversion    Ankle eversion     (Blank rows = not tested)  LUMBAR SPECIAL TESTS:  12/10/2021 (-) slump test bilateral, (-) crossed SLR  FUNCTIONAL TESTS:  12/10/2021 18 inch chair s UE: multiple tries required but able to perform c leg bracing Lt and Rt SLS < 3 seconds each  GAIT: 12/10/2021 Independent ambulation c trendelenburg noted bilaterally Rt > Lt  TODAY'S TREATMENT:  DATE: 12/10/2021  Therex:    HEP instruction/performance c cues for techniques, handout provided.  Trial set performed of each for comprehension and symptom assessment.  See below for exercise list  PATIENT EDUCATION:  12/10/2021 Education details: HEP, POC Person educated: Patient Education method: Explanation, Demonstration, Verbal cues, and Handouts Education comprehension: verbalized understanding, returned demonstration, and verbal cues required  HOME EXERCISE PROGRAM: Access Code: [email or web address] URL: [email or web address] Date: 12/10/2021 Prepared by: Scot Jun  Exercises - Supine Lower Trunk Rotation  - 2-3 x daily - 7 x weekly - 1 sets - 3-5 reps - 15 hold - Supine Piriformis Stretch with Foot on Ground  - 2-3 x daily - 7 x weekly - 1 sets - 3-5 reps - 30 hold - Supine Figure 4 Piriformis Stretch  - 2-3 x daily - 7 x weekly - 1 sets - 3-5 reps - 30 hold - Standing Lumbar Extension  - 2-3 x daily - 7 x weekly - 1 sets - 5-10  reps - Supine Bridge  - 1-2 x daily - 7 x weekly - 1-2 sets - 10 reps - 2 hold - Standing Hip Hiking  - 1-2 x daily - 7 x weekly - 1 sets - 10 reps - 5 hold  ASSESSMENT:  CLINICAL IMPRESSION: Patient is a 76 y.o. who comes to clinic with complaints of low back pain with mobility, strength and movement coordination deficits that impair their ability to perform usual daily and recreational functional activities without increase difficulty/symptoms at this time.  Patient to benefit from skilled PT services to address impairments and limitations to improve to previous level of function without restriction secondary to condition.   OBJECTIVE IMPAIRMENTS: Abnormal gait, decreased activity tolerance, decreased balance, decreased endurance, decreased mobility, difficulty walking, decreased ROM, decreased strength, hypomobility, impaired perceived functional ability, impaired flexibility, improper body mechanics, and pain.   ACTIVITY LIMITATIONS: carrying, lifting, bending, standing, sleeping, stairs, transfers, and locomotion level  PARTICIPATION LIMITATIONS: interpersonal relationship, community activity, and occupation  PERSONAL FACTORS:  DM, GERD, Gout, peripherial neuropathy, history of prostate cancer, WPW syrndrome  are also affecting patient's functional outcome.   REHAB POTENTIAL: Good  CLINICAL DECISION MAKING: Stable/uncomplicated  EVALUATION COMPLEXITY: Low   GOALS: Goals reviewed with patient? Yes  SHORT TERM GOALS: (target date for Short term goals are 3 weeks 12/31/2021)  1. Patient will demonstrate independent use of home exercise program to maintain progress from in clinic treatments.  Goal status: New  LONG TERM GOALS: (target dates for all long term goals are 10 weeks  02/18/2022 )   1. Patient will demonstrate/report pain at worst less than or equal to 2/10 to facilitate minimal limitation in daily activity secondary to pain symptoms.  Goal status: New   2. Patient  will demonstrate independent use of home exercise program to facilitate ability to maintain/progress functional gains from skilled physical therapy services.  Goal status: New   3. Patient will demonstrate FOTO outcome > or = 62 % to indicate reduced disability due to condition.  Goal status: New   4. Patient will demonstrate lumbar extension 100 % WFL s symptoms to facilitate upright standing, walking posture at PLOF s limitation.  Goal status: New   5.  Patient will demonstrate bilateral SLS > or = 10 seconds to facilitate stability in ambulation and daily activity.  Goal status: New   6.  Patient will demonstrate bilateral hip strength 5/5 throughout to facilitate usual standing/walking at PLOF.  Goal status: New   PLAN:  PT FREQUENCY: 1-2x/week  PT DURATION: 10 weeks  PLANNED INTERVENTIONS: Therapeutic exercises, Therapeutic activity, Neuro Muscular re-education, Balance training, Gait training, Patient/Family education, Joint mobilization, Stair training, DME instructions, Dry Needling, Electrical stimulation, Cryotherapy, vasopneumatic device, Moist heat, Taping, Traction Ultrasound, Ionotophoresis '4mg'$ /ml Dexamethasone, and Manual therapy.  All included unless contraindicated  PLAN FOR NEXT SESSION: Review HEP knowledge/results.  Test lateral hip strength. Improve lumbar mobility   Scot Jun, PT, DPT, OCS, ATC 12/10/21  2:38 PM

## 2021-12-06 NOTE — Telephone Encounter (Signed)
Spoke with patient and he stated he will keep appointment for PT on 12/10/21 and injection on 12/11/21

## 2021-12-10 ENCOUNTER — Encounter: Payer: Self-pay | Admitting: Rehabilitative and Restorative Service Providers"

## 2021-12-10 ENCOUNTER — Other Ambulatory Visit: Payer: Self-pay

## 2021-12-10 ENCOUNTER — Ambulatory Visit (INDEPENDENT_AMBULATORY_CARE_PROVIDER_SITE_OTHER): Payer: Medicare Other | Admitting: Rehabilitative and Restorative Service Providers"

## 2021-12-10 DIAGNOSIS — M79604 Pain in right leg: Secondary | ICD-10-CM

## 2021-12-10 DIAGNOSIS — M5459 Other low back pain: Secondary | ICD-10-CM | POA: Diagnosis not present

## 2021-12-10 DIAGNOSIS — M6281 Muscle weakness (generalized): Secondary | ICD-10-CM

## 2021-12-10 DIAGNOSIS — R262 Difficulty in walking, not elsewhere classified: Secondary | ICD-10-CM

## 2021-12-11 ENCOUNTER — Ambulatory Visit: Payer: Self-pay

## 2021-12-11 ENCOUNTER — Ambulatory Visit (INDEPENDENT_AMBULATORY_CARE_PROVIDER_SITE_OTHER): Payer: Medicare Other | Admitting: Physical Medicine and Rehabilitation

## 2021-12-11 VITALS — BP 159/79 | HR 101

## 2021-12-11 DIAGNOSIS — M5416 Radiculopathy, lumbar region: Secondary | ICD-10-CM

## 2021-12-11 MED ORDER — METHYLPREDNISOLONE ACETATE 80 MG/ML IJ SUSP
40.0000 mg | Freq: Once | INTRAMUSCULAR | Status: AC
  Administered 2021-12-11: 40 mg

## 2021-12-11 NOTE — Patient Instructions (Signed)

## 2021-12-11 NOTE — Progress Notes (Signed)
Numeric Pain Rating Scale and Functional Assessment Average Pain 5   In the last MONTH (on 0-10 scale) has pain interfered with the following?  1. General activity like being  able to carry out your everyday physical activities such as walking, climbing stairs, carrying groceries, or moving a chair?  Rating(5)   +Driver, -BT, -Dye Allergies.  Pain on right side from buttocks to knee

## 2021-12-18 ENCOUNTER — Ambulatory Visit (INDEPENDENT_AMBULATORY_CARE_PROVIDER_SITE_OTHER): Payer: Medicare Other | Admitting: Physical Therapy

## 2021-12-18 ENCOUNTER — Encounter: Payer: Self-pay | Admitting: Physical Therapy

## 2021-12-18 DIAGNOSIS — R262 Difficulty in walking, not elsewhere classified: Secondary | ICD-10-CM | POA: Diagnosis not present

## 2021-12-18 DIAGNOSIS — M5459 Other low back pain: Secondary | ICD-10-CM | POA: Diagnosis not present

## 2021-12-18 DIAGNOSIS — M79604 Pain in right leg: Secondary | ICD-10-CM

## 2021-12-18 DIAGNOSIS — M6281 Muscle weakness (generalized): Secondary | ICD-10-CM | POA: Diagnosis not present

## 2021-12-18 NOTE — Therapy (Signed)
OUTPATIENT PHYSICAL THERAPY TREATMENT NOTE   Patient Name: Patrick Kidd MRN: [id number] DOB:1945-09-26, 76 y.o., male Today's Date: 12/18/2021  END OF SESSION:   PT End of Session - 12/18/21 1336     Visit Number 2    Number of Visits 20    Date for PT Re-Evaluation 02/18/22    Authorization Type Medicare and BCBS    Progress Note Due on Visit 10    PT Start Time 1337    PT Stop Time 1415    PT Time Calculation (min) 38 min    Activity Tolerance Patient tolerated treatment well    Behavior During Therapy WFL for tasks assessed/performed             Past Medical History:  Diagnosis Date   Acrophobia    Alcoholic cirrhosis ([place]) 35/36/1443   Anemia    Arthritis    foot by big toe   BPH associated with nocturia    Cataract    removed both eyes   Chronic cough    PMH of   CLL (chronic lymphocytic leukemia) ([place])    COVID-19 12/2018   Diabetes mellitus without complication ([place])    Diverticulosis 07/03/2010   Colonoscopy.    Duodenal ulcer 2017   Fallen arches    Bilateral   GERD (gastroesophageal reflux disease)    Gout    Granuloma annulare    Hx of adenomatous colonic polyps multiple   Hydrocele 2011   Large septated right hydrocele   Liver cyst    Liver lesion    Nonspecific elevation of levels of transaminase or lactic acid dehydrogenase (LDH)    Obesity    Peripheral neuropathy    Plantar fasciitis    PMH of   Portal hypertension ([place]) 2017   Prostate cancer ([place])    Right shoulder pain 11/2017   Sleep apnea    no cpap, patient denies   Thiamine deficiency    Wears reading eyeglasses    WPW (Wolff-Parkinson-White syndrome) 02/05/2019   Past Surgical History:  Procedure Laterality Date   CATARACT EXTRACTION, BILATERAL  12/2011    Dr Gershon Crane   COLONOSCOPY  2017   COLONOSCOPY W/ POLYPECTOMY  07/03/2010   2 adenomas, diverticulosis on right. Dr [name]   CYSTOSCOPY N/A 03/15/2018   Procedure: [name];  Surgeon: [name], MD;  Location: [facility];  Service: Urology;  Laterality: N/A;  NO SEEDS FOUND IN BLADDER   ESOPHAGOGASTRODUODENOSCOPY (EGD) WITH PROPOFOL N/A 01/08/2016   Procedure: ESOPHAGOGASTRODUODENOSCOPY (EGD) WITH PROPOFOL;  Surgeon: [name], MD;  Location: [facility];  Service: Endoscopy;  Laterality: N/A;   [place]   RADIOACTIVE SEED IMPLANT N/A 03/15/2018   Procedure: RADIOACTIVE SEED IMPLANT/BRACHYTHERAPY IMPLANT;  Surgeon: [name], MD;  Location: [facility];  Service: Urology;  Laterality: N/A;   73 SEEDS IMPLANTED   SIGMOIDOSCOPY     SPACE OAR INSTILLATION N/A 03/15/2018   Procedure: SPACE OAR INSTILLATION;  Surgeon: [name], MD;  Location: [facility];  Service: Urology;  Laterality: N/A;   WISDOM TOOTH EXTRACTION     Patient Active Problem List   Diagnosis Date Noted   Chronic pain of right hip 11/05/2021   Urinary incontinence 06/25/2021   Degenerative lumbar spinal stenosis 04/30/2021   Degenerative arthritis of knee, bilateral 03/27/2021   Chronic lymphocytic leukemia ([place]) 11/08/2020   Insulin-requiring or dependent type II diabetes mellitus ([place]) 09/25/2020  Stage 3a chronic kidney disease ([place]) 03/15/2020   Thiamine deficiency    Paroxysmal atrial fibrillation ([place]) 07/19/2019   Allergic rhinitis 06/09/2019   Erectile dysfunction due to diabetes mellitus ([place]) 06/09/2019   Overweight with body mass index (BMI) of 28 to 28.9 in adult 03/17/2019   WPW (Wolff-Parkinson-White syndrome) 02/05/2019   SBP (spontaneous bacterial peritonitis) ([facility])-December 2020,     Encephalopathy, hepatic ([place])    Degenerative disc disease, cervical 07/21/2018   Cervical radiculopathy 07/16/2018   OAB (overactive bladder) 07/06/2018   Malignant neoplasm of prostate ([place]) 01/13/2018   Hyperlipidemia LDL goal <100 11/11/2017   Essential hypertension 11/11/2017   BPH associated with nocturia 11/11/2017    Erectile dysfunction due to arterial insufficiency 04/07/2017   Peripheral vascular disease ([place]) 49/44/9675   Alcoholic cirrhosis ([place]) 91/63/8466   Type 2 diabetes mellitus with complication, with long-term current use of insulin ([place]) 08/22/2015   Hypersomnolence 03/19/2014   Peripheral neuropathy 01/11/2013   Polyclonal gammopathy 01/11/2013   GERD 10/04/2009     THERAPY DIAG:  Other low back pain  Pain in right leg  Difficulty in walking, not elsewhere classified  Muscle weakness (generalized)  PCP: [name]. MD   REFERRING PROVIDER: Callie Fielding, MD   REFERRING DIAG: M54.16 (ICD-10-CM) - Radiculopathy, lumbar region   Rationale for Evaluation and Treatment: Rehabilitation     ONSET DATE: Aug 2023   SUBJECTIVE:                                                                                                                                                                                            SUBJECTIVE STATEMENT: He relays a constant 2/10 pain in his Rt buttock. He has a knot back there. Has gotten better since having injection and has been doing the exercises. He does not have any back pain now, the Rt leg pain is now only in the buttock and no longer down to the knee.    PERTINENT HISTORY:  History of injection in April 2023.  Had therapy prior to injection with some improvements.  DM, GERD, Gout, peripherial neuropathy, history of prostate cancer, WPW syrndrome   PAIN:  NPRS scale: 0/10 current, low moderate /10 Pain location: Rt buttock/posterior leg Pain description: achy, numbness Aggravating factors: lying, prolonged standing (unsure of timeline), stairs Relieving factors: sitting   PRECAUTIONS: None   WEIGHT BEARING RESTRICTIONS: No   FALLS:  Has patient fallen in last 6 months? No   LIVING ENVIRONMENT: Lives in: House/apartment Stairs: 1 stair (uses door)     PLOF: Independent, golf (mobility problem limited - last time approx.  3  years), travel for vacation for boat   PATIENT GOALS: Reduce pain, improve mobility     OBJECTIVE:    DIAGNOSTIC FINDINGS:  12/10/2021 review of chart:  XR of the lumbar spine from 11/20/2021 was independently reviewed and interpreted, showing disc height loss at multiple levels in the lumbar spine. Anterior osteophyte formation most prominently at L3/4 and L4/5. No fracture or dislocation. No evidence of instability on flexion/extension.    MRI of the lumbar spine from 04/09/2021 was independently reviewed and interpreted, showing DDD multiple levels. Lateral recess and central stenosis at L3/4. Lateral recess stenosis at L2/3, L4/5 and L5/S1.    PATIENT SURVEYS:  12/10/2021 FOTO eval:  49  predicted:  62   SCREENING FOR RED FLAGS: 12/10/2021 Bowel or bladder incontinence: No Cauda equina syndrome: No   COGNITION: 12/10/2021 Overall cognitive status: WFL normal                                      SENSATION: 12/10/2021 No specific testing today   MUSCLE LENGTH: 12/10/2021 Hamstrings: Right 60 deg; Left 60 deg     POSTURE: 12/10/2021  Very mild lateral truck shift to Lt in standing, easily corrected with no change in symptoms noted.    PALPATION: 12/10/2021 No specific tenderness in lumbar region to touch     LUMBAR ROM:    AROM 12/10/2021  Flexion To knee joint, tightness in hamstrings noted  Extension 50 % WFL c mild buttock pain   Repeated x 5 in standing, improved to 75% c continued catch of buttock pain noted  -- no specific centralization or peripheralization noted  Right lateral flexion To lateral knee jt no pain  Left lateral flexion To lateral knee jt no pain  Right rotation    Left rotation     (Blank rows = not tested)   LOWER EXTREMITY ROM:        Right 12/10/2021 Left 12/10/2021  Hip flexion      Hip extension      Hip abduction      Hip adduction      Hip internal rotation AROM WFL no complaint AROM WFL no complaints  Hip external rotation      Knee  flexion      Knee extension      Ankle dorsiflexion      Ankle plantarflexion      Ankle inversion      Ankle eversion       (Blank rows = not tested)   LOWER EXTREMITY MMT:     MMT Right 12/18/2021   Hip flexion      Hip extension 4   Hip abduction 4   Hip adduction      Hip internal rotation      Hip external rotation      Knee flexion      Knee extension      Ankle dorsiflexion      Ankle plantarflexion      Ankle inversion      Ankle eversion       (Blank rows = not tested)   LUMBAR SPECIAL TESTS:  12/10/2021 (-) slump test bilateral, (-) crossed SLR   FUNCTIONAL TESTS:  12/10/2021 18 inch chair s UE: multiple tries required but able to perform c leg bracing Lt and Rt SLS < 3 seconds each   GAIT: 12/10/2021 Independent ambulation c trendelenburg noted bilaterally Rt >  Lt   TODAY'S TREATMENT:                                                                                                                              DATE:  12/18/21 Low trunk rotation 5 sec X 10 Piriformis stretch 30 sec X 3 bilat Supine bridges X 10 Sidelying clams X 15 Sidelying hip abd X10   Manual therapy for Massage gun followed bu skilled palpation and active compression with Trigger Point Dry-Needling  Treatment instructions: Expect mild to moderate muscle soreness. Patient Consent Given: Yes Education handout provided: verbally provided Muscles treated: Rt piriformis and glutes Treatment response/outcome: good overall tolerance,twitch response noted      12/10/2021  Therex:    HEP instruction/performance c cues for techniques, handout provided.  Trial set performed of each for comprehension and symptom assessment.  See below for exercise list   PATIENT EDUCATION:  12/10/2021 Education details: HEP, POC Person educated: Patient Education method: Explanation, Demonstration, Verbal cues, and Handouts Education comprehension: verbalized understanding, returned demonstration, and  verbal cues required   HOME EXERCISE PROGRAM: Access Code: [email or web address] URL: [email or web address] Date: 12/10/2021 Prepared by: Scot Jun   Exercises - Supine Lower Trunk Rotation  - 2-3 x daily - 7 x weekly - 1 sets - 3-5 reps - 15 hold - Supine Piriformis Stretch with Foot on Ground  - 2-3 x daily - 7 x weekly - 1 sets - 3-5 reps - 30 hold - Supine Figure 4 Piriformis Stretch  - 2-3 x daily - 7 x weekly - 1 sets - 3-5 reps - 30 hold - Standing Lumbar Extension  - 2-3 x daily - 7 x weekly - 1 sets - 5-10 reps - Supine Bridge  - 1-2 x daily - 7 x weekly - 1-2 sets - 10 reps - 2 hold - Standing Hip Hiking  - 1-2 x daily - 7 x weekly - 1 sets - 10 reps - 5 hold   ASSESSMENT:   CLINICAL IMPRESSION: He was not having back pain and denies any N/T in his leg which is hopefully a sign of centralization or pain that is more localized to the Rt glutes. He did have some weakness noted there so I gave him additional strengthening exericses for this and provided manual therapy for massage gun and DN to this area. He had good response from this and we will assess his more long term response to this next session.    OBJECTIVE IMPAIRMENTS: Abnormal gait, decreased activity tolerance, decreased balance, decreased endurance, decreased mobility, difficulty walking, decreased ROM, decreased strength, hypomobility, impaired perceived functional ability, impaired flexibility, improper body mechanics, and pain.    ACTIVITY LIMITATIONS: carrying, lifting, bending, standing, sleeping, stairs, transfers, and locomotion level   PARTICIPATION LIMITATIONS: interpersonal relationship, community activity, and occupation   PERSONAL FACTORS:  DM, GERD, Gout, peripherial neuropathy, history of prostate cancer, WPW syrndrome  are also affecting patient's functional outcome.    REHAB  POTENTIAL: Good   CLINICAL DECISION MAKING: Stable/uncomplicated   EVALUATION COMPLEXITY: Low     GOALS: Goals  reviewed with patient? Yes   SHORT TERM GOALS: (target date for Short term goals are 3 weeks 12/31/2021)   1. Patient will demonstrate independent use of home exercise program to maintain progress from in clinic treatments.   Goal status: New   LONG TERM GOALS: (target dates for all long term goals are 10 weeks  02/18/2022 )   1. Patient will demonstrate/report pain at worst less than or equal to 2/10 to facilitate minimal limitation in daily activity secondary to pain symptoms.   Goal status: New   2. Patient will demonstrate independent use of home exercise program to facilitate ability to maintain/progress functional gains from skilled physical therapy services.   Goal status: New   3. Patient will demonstrate FOTO outcome > or = 62 % to indicate reduced disability due to condition.   Goal status: New   4. Patient will demonstrate lumbar extension 100 % WFL s symptoms to facilitate upright standing, walking posture at PLOF s limitation.   Goal status: New   5.  Patient will demonstrate bilateral SLS > or = 10 seconds to facilitate stability in ambulation and daily activity.  Goal status: New   6.  Patient will demonstrate bilateral hip strength 5/5 throughout to facilitate usual standing/walking at PLOF.  Goal status: New     PLAN:   PT FREQUENCY: 1-2x/week   PT DURATION: 10 weeks   PLANNED INTERVENTIONS: Therapeutic exercises, Therapeutic activity, Neuro Muscular re-education, Balance training, Gait training, Patient/Family education, Joint mobilization, Stair training, DME instructions, Dry Needling, Electrical stimulation, Cryotherapy, vasopneumatic device, Moist heat, Taping, Traction Ultrasound, Ionotophoresis '4mg'$ /ml Dexamethasone, and Manual therapy.  All included unless contraindicated   PLAN FOR NEXT SESSION: How was DN and repeat if desired. Improve lumbar mobility and Rt glute strength    Debbe Odea, PT,DPT 12/18/2021, 2:16 PM

## 2021-12-19 ENCOUNTER — Telehealth: Payer: Self-pay | Admitting: Internal Medicine

## 2021-12-19 NOTE — Telephone Encounter (Signed)
Form has been signed by PCP and faxed back to [facility]

## 2021-12-19 NOTE — Telephone Encounter (Signed)
PT visits today with Medicare Part D patietn assistance forms to be filled out by Dr.Jones. Forms have been placed in provider's mailbox.  CB if needed: [phone]

## 2021-12-24 NOTE — Procedures (Signed)
Lumbosacral Transforaminal Epidural Steroid Injection - Sub-Pedicular Approach with Fluoroscopic Guidance  Patient: Patrick Kidd      Date of Birth: 1946/01/10 MRN: [id number] PCP: [name], MD      Visit Date: 12/11/2021   Universal Protocol:    Date/Time: 12/11/2021  Consent Given By: the patient  Position: PRONE  Additional Comments: Vital signs were monitored before and after the procedure. Patient was prepped and draped in the usual sterile fashion. The correct patient, procedure, and site was verified.   Injection Procedure Details:   Procedure diagnoses: Lumbar radiculopathy [M54.16]    Meds Administered:  Meds ordered this encounter  Medications   methylPREDNISolone acetate (DEPO-MEDROL) injection 40 mg    Laterality: Right  Location/Site: L4 and L5  Needle:5.0 in., 22 ga.  Short bevel or Quincke spinal needle  Needle Placement: Transforaminal  Findings:    -Comments: Excellent flow of contrast along the nerve, nerve root and into the epidural space.  Procedure Details: After squaring off the end-plates to get a true AP view, the C-arm was positioned so that an oblique view of the foramen as noted above was visualized. The target area is just inferior to the "nose of the scotty dog" or sub pedicular. The soft tissues overlying this structure were infiltrated with 2-3 ml. of 1% Lidocaine without Epinephrine.  The spinal needle was inserted toward the target using a "trajectory" view along the fluoroscope beam.  Under AP and lateral visualization, the needle was advanced so it did not puncture dura and was located close the 6 O'Clock position of the pedical in AP tracterory. Biplanar projections were used to confirm position. Aspiration was confirmed to be negative for CSF and/or blood. A 1-2 ml. volume of Isovue-250 was injected and flow of contrast was noted at each level. Radiographs were obtained for documentation purposes.   After attaining the  desired flow of contrast documented above, a 0.5 to 1.0 ml test dose of 0.25% Marcaine was injected into each respective transforaminal space.  The patient was observed for 90 seconds post injection.  After no sensory deficits were reported, and normal lower extremity motor function was noted,   the above injectate was administered so that equal amounts of the injectate were placed at each foramen (level) into the transforaminal epidural space.   Additional Comments:  No complications occurred Dressing: 2 x 2 sterile gauze and Band-Aid    Post-procedure details: Patient was observed during the procedure. Post-procedure instructions were reviewed.  Patient left the clinic in stable condition.

## 2021-12-24 NOTE — Progress Notes (Signed)
Patrick Kidd - 76 y.o. male MRN [id number]  Date of birth: [birth year]  Office Visit Note: Visit Date: 12/11/2021 PCP: [name], MD Referred by: Patrick Fielding, MD  Subjective: Chief Complaint  Patient presents with   Lower Back - Pain   HPI:  Patrick Kidd is a 76 y.o. male who comes in today at the request of Dr. Ileene Rubens for planned Right L4-5 and L5-S1 Lumbar Transforaminal epidural steroid injection with fluoroscopic guidance.  The patient has failed conservative care including home exercise, medications, time and activity modification.  This injection will be diagnostic and hopefully therapeutic.  Please see requesting physician notes for further details and justification.   ROS Otherwise per HPI.  Assessment & Plan: Visit Diagnoses:    ICD-10-CM   1. Lumbar radiculopathy  M54.16 XR C-ARM NO REPORT    Epidural Steroid injection    methylPREDNISolone acetate (DEPO-MEDROL) injection 40 mg      Plan: No additional findings.   Meds & Orders:  Meds ordered this encounter  Medications   methylPREDNISolone acetate (DEPO-MEDROL) injection 40 mg    Orders Placed This Encounter  Procedures   XR C-ARM NO REPORT   Epidural Steroid injection    Follow-up: Return for visit to requesting provider as needed.   Procedures: No procedures performed  Lumbosacral Transforaminal Epidural Steroid Injection - Sub-Pedicular Approach with Fluoroscopic Guidance  Patient: Patrick Kidd      Date of Birth: 1945-04-20 MRN: [id number] PCP: [name], MD      Visit Date: 12/11/2021   Universal Protocol:    Date/Time: 12/11/2021  Consent Given By: the patient  Position: PRONE  Additional Comments: Vital signs were monitored before and after the procedure. Patient was prepped and draped in the usual sterile fashion. The correct patient, procedure, and site was verified.   Injection Procedure Details:   Procedure diagnoses: Lumbar radiculopathy  [M54.16]    Meds Administered:  Meds ordered this encounter  Medications   methylPREDNISolone acetate (DEPO-MEDROL) injection 40 mg    Laterality: Right  Location/Site: L4 and L5  Needle:5.0 in., 22 ga.  Short bevel or Quincke spinal needle  Needle Placement: Transforaminal  Findings:    -Comments: Excellent flow of contrast along the nerve, nerve root and into the epidural space.  Procedure Details: After squaring off the end-plates to get a true AP view, the C-arm was positioned so that an oblique view of the foramen as noted above was visualized. The target area is just inferior to the "nose of the scotty dog" or sub pedicular. The soft tissues overlying this structure were infiltrated with 2-3 ml. of 1% Lidocaine without Epinephrine.  The spinal needle was inserted toward the target using a "trajectory" view along the fluoroscope beam.  Under AP and lateral visualization, the needle was advanced so it did not puncture dura and was located close the 6 O'Clock position of the pedical in AP tracterory. Biplanar projections were used to confirm position. Aspiration was confirmed to be negative for CSF and/or blood. A 1-2 ml. volume of Isovue-250 was injected and flow of contrast was noted at each level. Radiographs were obtained for documentation purposes.   After attaining the desired flow of contrast documented above, a 0.5 to 1.0 ml test dose of 0.25% Marcaine was injected into each respective transforaminal space.  The patient was observed for 90 seconds post injection.  After no sensory deficits were reported, and normal lower extremity motor function was noted,  the above injectate was administered so that equal amounts of the injectate were placed at each foramen (level) into the transforaminal epidural space.   Additional Comments:  No complications occurred Dressing: 2 x 2 sterile gauze and Band-Aid    Post-procedure details: Patient was observed during the  procedure. Post-procedure instructions were reviewed.  Patient left the clinic in stable condition.    Clinical History: No specialty comments available.     Objective:  VS:  HT:    WT:   BMI:     BP:(!) 159/79  HR:(!) 101bpm  TEMP: ( )  RESP:  Physical Exam Vitals and nursing note reviewed.  Constitutional:      General: He is not in acute distress.    Appearance: Normal appearance. He is not ill-appearing.  HENT:     Head: Normocephalic and atraumatic.     Right Ear: External ear normal.     Left Ear: External ear normal.     Nose: No congestion.  Eyes:     Extraocular Movements: Extraocular movements intact.  Cardiovascular:     Rate and Rhythm: Normal rate.     Pulses: Normal pulses.  Pulmonary:     Effort: Pulmonary effort is normal. No respiratory distress.  Abdominal:     General: There is no distension.     Palpations: Abdomen is soft.  Musculoskeletal:        General: No tenderness or signs of injury.     Cervical back: Neck supple.     Right lower leg: No edema.     Left lower leg: No edema.     Comments: Patient has good distal strength without clonus.  Skin:    Findings: No erythema or rash.  Neurological:     General: No focal deficit present.     Mental Status: He is alert and oriented to person, place, and time.     Sensory: No sensory deficit.     Motor: No weakness or abnormal muscle tone.     Coordination: Coordination normal.  Psychiatric:        Mood and Affect: Mood normal.        Behavior: Behavior normal.      Imaging: No results found.

## 2021-12-28 ENCOUNTER — Other Ambulatory Visit: Payer: Self-pay | Admitting: Internal Medicine

## 2021-12-28 DIAGNOSIS — R053 Chronic cough: Secondary | ICD-10-CM

## 2021-12-28 DIAGNOSIS — K219 Gastro-esophageal reflux disease without esophagitis: Secondary | ICD-10-CM

## 2021-12-31 ENCOUNTER — Encounter: Payer: Self-pay | Admitting: Rehabilitative and Restorative Service Providers"

## 2021-12-31 ENCOUNTER — Ambulatory Visit (INDEPENDENT_AMBULATORY_CARE_PROVIDER_SITE_OTHER): Payer: Medicare Other | Admitting: Rehabilitative and Restorative Service Providers"

## 2021-12-31 DIAGNOSIS — R262 Difficulty in walking, not elsewhere classified: Secondary | ICD-10-CM | POA: Diagnosis not present

## 2021-12-31 DIAGNOSIS — M5459 Other low back pain: Secondary | ICD-10-CM

## 2021-12-31 DIAGNOSIS — M79604 Pain in right leg: Secondary | ICD-10-CM

## 2021-12-31 DIAGNOSIS — M6281 Muscle weakness (generalized): Secondary | ICD-10-CM | POA: Diagnosis not present

## 2021-12-31 NOTE — Therapy (Signed)
OUTPATIENT PHYSICAL THERAPY TREATMENT NOTE   Patient Name: Patrick Kidd MRN: [id number] DOB:1945/12/16, 76 y.o., male Today's Date: 12/31/2021  END OF SESSION:   PT End of Session - 12/31/21 1342     Visit Number 3    Number of Visits 20    Date for PT Re-Evaluation 02/18/22    Authorization Type Medicare and BCBS    Progress Note Due on Visit 10    PT Start Time 1342    PT Stop Time 1421    PT Time Calculation (min) 39 min    Activity Tolerance Patient tolerated treatment well    Behavior During Therapy WFL for tasks assessed/performed              Past Medical History:  Diagnosis Date   Acrophobia    Alcoholic cirrhosis ([place]) 06/00/4599   Anemia    Arthritis    foot by big toe   BPH associated with nocturia    Cataract    removed both eyes   Chronic cough    PMH of   CLL (chronic lymphocytic leukemia) ([place])    COVID-19 12/2018   Diabetes mellitus without complication ([place])    Diverticulosis 07/03/2010   Colonoscopy.    Duodenal ulcer 2017   Fallen arches    Bilateral   GERD (gastroesophageal reflux disease)    Gout    Granuloma annulare    Hx of adenomatous colonic polyps multiple   Hydrocele 2011   Large septated right hydrocele   Liver cyst    Liver lesion    Nonspecific elevation of levels of transaminase or lactic acid dehydrogenase (LDH)    Obesity    Peripheral neuropathy    Plantar fasciitis    PMH of   Portal hypertension ([place]) 2017   Prostate cancer ([place])    Right shoulder pain 11/2017   Sleep apnea    no cpap, patient denies   Thiamine deficiency    Wears reading eyeglasses    WPW (Wolff-Parkinson-White syndrome) 02/05/2019   Past Surgical History:  Procedure Laterality Date   CATARACT EXTRACTION, BILATERAL  12/2011    Dr Gershon Crane   COLONOSCOPY  2017   COLONOSCOPY W/ POLYPECTOMY  07/03/2010   2 adenomas, diverticulosis on right. Dr [name]   CYSTOSCOPY N/A 03/15/2018   Procedure: [name];  Surgeon: [name], MD;  Location: [facility];  Service: Urology;  Laterality: N/A;  NO SEEDS FOUND IN BLADDER   ESOPHAGOGASTRODUODENOSCOPY (EGD) WITH PROPOFOL N/A 01/08/2016   Procedure: ESOPHAGOGASTRODUODENOSCOPY (EGD) WITH PROPOFOL;  Surgeon: [name], MD;  Location: [facility];  Service: Endoscopy;  Laterality: N/A;   [place]   RADIOACTIVE SEED IMPLANT N/A 03/15/2018   Procedure: RADIOACTIVE SEED IMPLANT/BRACHYTHERAPY IMPLANT;  Surgeon: [name], MD;  Location: [facility];  Service: Urology;  Laterality: N/A;   73 SEEDS IMPLANTED   SIGMOIDOSCOPY     SPACE OAR INSTILLATION N/A 03/15/2018   Procedure: SPACE OAR INSTILLATION;  Surgeon: [name], MD;  Location: [facility];  Service: Urology;  Laterality: N/A;   WISDOM TOOTH EXTRACTION     Patient Active Problem List   Diagnosis Date Noted   Chronic pain of right hip 11/05/2021   Urinary incontinence 06/25/2021   Degenerative lumbar spinal stenosis 04/30/2021   Degenerative arthritis of knee, bilateral 03/27/2021   Chronic lymphocytic leukemia ([place]) 11/08/2020   Insulin-requiring or dependent type II diabetes mellitus ([place]) 09/25/2020  Stage 3a chronic kidney disease ([place]) 03/15/2020   Thiamine deficiency    Paroxysmal atrial fibrillation ([place]) 07/19/2019   Allergic rhinitis 06/09/2019   Erectile dysfunction due to diabetes mellitus ([place]) 06/09/2019   Overweight with body mass index (BMI) of 28 to 28.9 in adult 03/17/2019   WPW (Wolff-Parkinson-White syndrome) 02/05/2019   SBP (spontaneous bacterial peritonitis) (HCC)-December 2020,     Encephalopathy, hepatic ([place])    Degenerative disc disease, cervical 07/21/2018   Cervical radiculopathy 07/16/2018   OAB (overactive bladder) 07/06/2018   Malignant neoplasm of prostate ([place]) 01/13/2018   Hyperlipidemia LDL goal <100 11/11/2017   Essential hypertension 11/11/2017   BPH associated with nocturia  11/11/2017   Erectile dysfunction due to arterial insufficiency 04/07/2017   Peripheral vascular disease ([place]) 36/01/2448   Alcoholic cirrhosis ([place]) 75/30/0511   Type 2 diabetes mellitus with complication, with long-term current use of insulin ([place]) 08/22/2015   Hypersomnolence 03/19/2014   Peripheral neuropathy 01/11/2013   Polyclonal gammopathy 01/11/2013   GERD 10/04/2009     THERAPY DIAG:  Other low back pain  Pain in right leg  Difficulty in walking, not elsewhere classified  Muscle weakness (generalized)  PCP: [name]. MD   REFERRING PROVIDER: Callie Fielding, MD   REFERRING DIAG: M54.16 (ICD-10-CM) - Radiculopathy, lumbar region   Rationale for Evaluation and Treatment: Rehabilitation     ONSET DATE: Aug 2023   SUBJECTIVE:                                                                                                                                                                                            SUBJECTIVE STATEMENT: Pt indicated not having near as bad pain.  Pt indicated feeling less down leg.  Thought he has had improvement.  Pt indicated pain at 3/10 at worst.  Pt indicated getting up about 4 am in the morning due to discomfort.    PERTINENT HISTORY:  History of injection in April 2023.  Had therapy prior to injection with some improvements.  DM, GERD, Gout, peripherial neuropathy, history of prostate cancer, WPW syrndrome   PAIN:  NPRS scale: at worst 3/10 in last week Pain location: Rt buttock/posterior leg Pain description: achy, numbness Aggravating factors: lying, prolonged standing (unsure of timeline), stairs Relieving factors: sitting   PRECAUTIONS: None   WEIGHT BEARING RESTRICTIONS: No   FALLS:  Has patient fallen in last 6 months? No   LIVING ENVIRONMENT: Lives in: House/apartment Stairs: 1 stair (uses door)     PLOF: Independent, golf (mobility problem limited - last time approx. 3 years), travel for vacation for  boat   PATIENT  GOALS: Reduce pain, improve mobility     OBJECTIVE:    DIAGNOSTIC FINDINGS:  12/10/2021 review of chart:  XR of the lumbar spine from 11/20/2021 was independently reviewed and interpreted, showing disc height loss at multiple levels in the lumbar spine. Anterior osteophyte formation most prominently at L3/4 and L4/5. No fracture or dislocation. No evidence of instability on flexion/extension.    MRI of the lumbar spine from 04/09/2021 was independently reviewed and interpreted, showing DDD multiple levels. Lateral recess and central stenosis at L3/4. Lateral recess stenosis at L2/3, L4/5 and L5/S1.    PATIENT SURVEYS:  12/31/2021: 57  12/10/2021 FOTO eval:  49  predicted:  62   SCREENING FOR RED FLAGS: 12/10/2021 Bowel or bladder incontinence: No Cauda equina syndrome: No   COGNITION: 12/10/2021 Overall cognitive status: WFL normal                                      SENSATION: 12/10/2021 No specific testing today   MUSCLE LENGTH: 12/10/2021 Hamstrings: Right 60 deg; Left 60 deg     POSTURE: 12/10/2021  Very mild lateral truck shift to Lt in standing, easily corrected with no change in symptoms noted.    PALPATION: 12/10/2021 No specific tenderness in lumbar region to touch     LUMBAR ROM:    AROM 12/10/2021 12/31/2021  Flexion To knee joint, tightness in hamstrings noted   Extension 50 % WFL c mild buttock pain   Repeated x 5 in standing, improved to 75% c continued catch of buttock pain noted  -- no specific centralization or peripheralization noted 75% c end range symptoms Rt buttock mild  Repeated x 5 in standing - mild reduction in symptoms  Right lateral flexion To lateral knee jt no pain   Left lateral flexion To lateral knee jt no pain   Right rotation     Left rotation      (Blank rows = not tested)   LOWER EXTREMITY ROM:        Right 12/10/2021 Left 12/10/2021  Hip flexion      Hip extension      Hip abduction      Hip adduction      Hip  internal rotation AROM WFL no complaint AROM WFL no complaints  Hip external rotation      Knee flexion      Knee extension      Ankle dorsiflexion      Ankle plantarflexion      Ankle inversion      Ankle eversion       (Blank rows = not tested)   LOWER EXTREMITY MMT:     MMT Right 12/18/2021   Hip flexion      Hip extension 4   Hip abduction 4   Hip adduction      Hip internal rotation      Hip external rotation      Knee flexion      Knee extension      Ankle dorsiflexion      Ankle plantarflexion      Ankle inversion      Ankle eversion       (Blank rows = not tested)   LUMBAR SPECIAL TESTS:  12/10/2021 (-) slump test bilateral, (-) crossed SLR   FUNCTIONAL TESTS:  12/10/2021 18 inch chair s UE: multiple tries required but able to perform c leg bracing Lt  and Rt SLS < 3 seconds each   GAIT: 12/10/2021 Independent ambulation c trendelenburg noted bilaterally Rt > Lt   TODAY'S TREATMENT:                                                                                                                              DATE: 12/31/2021 Therex: Standing lumbar extension x 5 Supine SKC 15 sec x 3 on Rt Supine piriformis figure 4 into flexion 15 sec x 3 on Rt Supine bridges X 10 Nustep lvl 6 8 mins UE/LE  Manual therapy for Massage gun to Rt glute med followed bu skilled palpation and active compression with Trigger Point Dry-Needling  Treatment instructions: Expect mild to moderate muscle soreness. Patient Consent Given: Yes Education handout provided: previously provided Muscles treated: Rt piriformis and glute max Treatment response/outcome: good overall tolerance,twitch response noted c concordant symptoms   TODAY'S TREATMENT:                                                                                                                              DATE: 12/18/2021  Low trunk rotation 5 sec X 10 Piriformis stretch 30 sec X 3 bilat Supine bridges X 10 Sidelying  clams X 15 Sidelying hip abd X10   Manual therapy for Massage gun followed bu skilled palpation and active compression with Trigger Point Dry-Needling  Treatment instructions: Expect mild to moderate muscle soreness. Patient Consent Given: Yes Education handout provided: verbally provided Muscles treated: Rt piriformis and glutes Treatment response/outcome: good overall tolerance,twitch response noted      TODAY'S TREATMENT:                                                                                                                              DATE: 12/10/2021 Therex:    HEP instruction/performance c cues for techniques, handout provided.  Trial set  performed of each for comprehension and symptom assessment.  See below for exercise list   PATIENT EDUCATION:  12/10/2021 Education details: HEP, POC Person educated: Patient Education method: Explanation, Demonstration, Verbal cues, and Handouts Education comprehension: verbalized understanding, returned demonstration, and verbal cues required   HOME EXERCISE PROGRAM: Access Code: [email or web address] URL: [email or web address] Date: 12/10/2021 Prepared by: Scot Jun   Exercises - Supine Lower Trunk Rotation  - 2-3 x daily - 7 x weekly - 1 sets - 3-5 reps - 15 hold - Supine Piriformis Stretch with Foot on Ground  - 2-3 x daily - 7 x weekly - 1 sets - 3-5 reps - 30 hold - Supine Figure 4 Piriformis Stretch  - 2-3 x daily - 7 x weekly - 1 sets - 3-5 reps - 30 hold - Standing Lumbar Extension  - 2-3 x daily - 7 x weekly - 1 sets - 5-10 reps - Supine Bridge  - 1-2 x daily - 7 x weekly - 1-2 sets - 10 reps - 2 hold - Standing Hip Hiking  - 1-2 x daily - 7 x weekly - 1 sets - 10 reps - 5 hold   ASSESSMENT:   CLINICAL IMPRESSION: Strong concordant symptom noted from twitch response in Rt posterior hip musculature.  Slight adjustment in hip hike to include hip abduction into doorframe.  FOTO reassessment showed improvement as  noted above.  Gains have been made to this point.  Continued skilled PT services indicated to continue towards goals.     OBJECTIVE IMPAIRMENTS: Abnormal gait, decreased activity tolerance, decreased balance, decreased endurance, decreased mobility, difficulty walking, decreased ROM, decreased strength, hypomobility, impaired perceived functional ability, impaired flexibility, improper body mechanics, and pain.    ACTIVITY LIMITATIONS: carrying, lifting, bending, standing, sleeping, stairs, transfers, and locomotion level   PARTICIPATION LIMITATIONS: interpersonal relationship, community activity, and occupation   PERSONAL FACTORS:  DM, GERD, Gout, peripherial neuropathy, history of prostate cancer, WPW syrndrome  are also affecting patient's functional outcome.    REHAB POTENTIAL: Good   CLINICAL DECISION MAKING: Stable/uncomplicated   EVALUATION COMPLEXITY: Low     GOALS: Goals reviewed with patient? Yes   SHORT TERM GOALS: (target date for Short term goals are 3 weeks 12/31/2021)   1. Patient will demonstrate independent use of home exercise program to maintain progress from in clinic treatments.   Goal status: Met   LONG TERM GOALS: (target dates for all long term goals are 10 weeks  02/18/2022 )   1. Patient will demonstrate/report pain at worst less than or equal to 2/10 to facilitate minimal limitation in daily activity secondary to pain symptoms.   Goal status: on going 12/31/2021   2. Patient will demonstrate independent use of home exercise program to facilitate ability to maintain/progress functional gains from skilled physical therapy services.   Goal status:on going 12/31/2021   3. Patient will demonstrate FOTO outcome > or = 62 % to indicate reduced disability due to condition.   Goal status: on going 12/31/2021   4. Patient will demonstrate lumbar extension 100 % WFL s symptoms to facilitate upright standing, walking posture at PLOF s limitation.   Goal  status: on going 12/31/2021   5.  Patient will demonstrate bilateral SLS > or = 10 seconds to facilitate stability in ambulation and daily activity.  Goal status: on going 12/31/2021   6.  Patient will demonstrate bilateral hip strength 5/5 throughout to facilitate usual standing/walking at PLOF.  Goal status: on going 12/31/2021  PLAN:   PT FREQUENCY: 1-2x/week   PT DURATION: 10 weeks   PLANNED INTERVENTIONS: Therapeutic exercises, Therapeutic activity, Neuro Muscular re-education, Balance training, Gait training, Patient/Family education, Joint mobilization, Stair training, DME instructions, Dry Needling, Electrical stimulation, Cryotherapy, vasopneumatic device, Moist heat, Taping, Traction Ultrasound, Ionotophoresis 98m/ml Dexamethasone, and Manual therapy.  All included unless contraindicated   PLAN FOR NEXT SESSION: Reassessment strength in hip.  DN as desired.     MScot Jun PT, DPT, OCS, ATC 12/31/21  2:27 PM

## 2022-01-02 ENCOUNTER — Encounter: Payer: Self-pay | Admitting: Physical Therapy

## 2022-01-02 ENCOUNTER — Ambulatory Visit (INDEPENDENT_AMBULATORY_CARE_PROVIDER_SITE_OTHER): Payer: Medicare Other | Admitting: Physical Therapy

## 2022-01-02 DIAGNOSIS — M5459 Other low back pain: Secondary | ICD-10-CM

## 2022-01-02 DIAGNOSIS — M6281 Muscle weakness (generalized): Secondary | ICD-10-CM | POA: Diagnosis not present

## 2022-01-02 DIAGNOSIS — R262 Difficulty in walking, not elsewhere classified: Secondary | ICD-10-CM | POA: Diagnosis not present

## 2022-01-02 DIAGNOSIS — M79604 Pain in right leg: Secondary | ICD-10-CM | POA: Diagnosis not present

## 2022-01-02 NOTE — Therapy (Signed)
OUTPATIENT PHYSICAL THERAPY TREATMENT NOTE   Patient Name: Patrick Kidd MRN: [id number] DOB:[birth year], 76 y.o., male Today's Date: 01/02/2022  END OF SESSION:   PT End of Session - 01/02/22 1344     Visit Number 4    Number of Visits 20    Date for PT Re-Evaluation 02/18/22    Authorization Type Medicare and BCBS    Progress Note Due on Visit 10    PT Start Time 1344    PT Stop Time 1423    PT Time Calculation (min) 39 min    Activity Tolerance Patient tolerated treatment well    Behavior During Therapy WFL for tasks assessed/performed              Past Medical History:  Diagnosis Date   Acrophobia    Alcoholic cirrhosis ([place]) 95/62/1308   Anemia    Arthritis    foot by big toe   BPH associated with nocturia    Cataract    removed both eyes   Chronic cough    PMH of   CLL (chronic lymphocytic leukemia) ([place])    COVID-19 12/2018   Diabetes mellitus without complication ([place])    Diverticulosis 07/03/2010   Colonoscopy.    Duodenal ulcer 2017   Fallen arches    Bilateral   GERD (gastroesophageal reflux disease)    Gout    Granuloma annulare    Hx of adenomatous colonic polyps multiple   Hydrocele 2011   Large septated right hydrocele   Liver cyst    Liver lesion    Nonspecific elevation of levels of transaminase or lactic acid dehydrogenase (LDH)    Obesity    Peripheral neuropathy    Plantar fasciitis    PMH of   Portal hypertension ([place]) 2017   Prostate cancer ([place])    Right shoulder pain 11/2017   Sleep apnea    no cpap, patient denies   Thiamine deficiency    Wears reading eyeglasses    WPW (Wolff-Parkinson-White syndrome) 02/05/2019   Past Surgical History:  Procedure Laterality Date   CATARACT EXTRACTION, BILATERAL  12/2011    Dr Gershon Crane   COLONOSCOPY  2017   COLONOSCOPY W/ POLYPECTOMY  07/03/2010   2 adenomas, diverticulosis on right. Dr [name]   CYSTOSCOPY N/A 03/15/2018   Procedure: [name];  Surgeon: [name], MD;  Location: [facility];  Service: Urology;  Laterality: N/A;  NO SEEDS FOUND IN BLADDER   ESOPHAGOGASTRODUODENOSCOPY (EGD) WITH PROPOFOL N/A 01/08/2016   Procedure: ESOPHAGOGASTRODUODENOSCOPY (EGD) WITH PROPOFOL;  Surgeon: [name], MD;  Location: [facility];  Service: Endoscopy;  Laterality: N/A;   [place]   RADIOACTIVE SEED IMPLANT N/A 03/15/2018   Procedure: RADIOACTIVE SEED IMPLANT/BRACHYTHERAPY IMPLANT;  Surgeon: [name], MD;  Location: [facility];  Service: Urology;  Laterality: N/A;   73 SEEDS IMPLANTED   SIGMOIDOSCOPY     SPACE OAR INSTILLATION N/A 03/15/2018   Procedure: SPACE OAR INSTILLATION;  Surgeon: [name], MD;  Location: [facility];  Service: Urology;  Laterality: N/A;   WISDOM TOOTH EXTRACTION     Patient Active Problem List   Diagnosis Date Noted   Chronic pain of right hip 11/05/2021   Urinary incontinence 06/25/2021   Degenerative lumbar spinal stenosis 04/30/2021   Degenerative arthritis of knee, bilateral 03/27/2021   Chronic lymphocytic leukemia ([place]) 11/08/2020   Insulin-requiring or dependent type II diabetes mellitus ([place]) 09/25/2020  Stage 3a chronic kidney disease ([place]) 03/15/2020   Thiamine deficiency    Paroxysmal atrial fibrillation ([place]) 07/19/2019   Allergic rhinitis 06/09/2019   Erectile dysfunction due to diabetes mellitus ([place]) 06/09/2019   Overweight with body mass index (BMI) of 28 to 28.9 in adult 03/17/2019   WPW (Wolff-Parkinson-White syndrome) 02/05/2019   SBP (spontaneous bacterial peritonitis) (H[facility])-December 2020,     Encephalopathy, hepatic ([place])    Degenerative disc disease, cervical 07/21/2018   Cervical radiculopathy 07/16/2018   OAB (overactive bladder) 07/06/2018   Malignant neoplasm of prostate ([place]) 01/13/2018   Hyperlipidemia LDL goal <100 11/11/2017   Essential hypertension 11/11/2017   BPH associated with nocturia  11/11/2017   Erectile dysfunction due to arterial insufficiency 04/07/2017   Peripheral vascular disease ([place]) 42/59/5638   Alcoholic cirrhosis ([place]) 75/64/3329   Type 2 diabetes mellitus with complication, with long-term current use of insulin ([place]) 08/22/2015   Hypersomnolence 03/19/2014   Peripheral neuropathy 01/11/2013   Polyclonal gammopathy 01/11/2013   GERD 10/04/2009     THERAPY DIAG:  Other low back pain  Pain in right leg  Difficulty in walking, not elsewhere classified  Muscle weakness (generalized)  PCP: [name]. MD   REFERRING PROVIDER: Callie Fielding, MD   REFERRING DIAG: M54.16 (ICD-10-CM) - Radiculopathy, lumbar region   Rationale for Evaluation and Treatment: Rehabilitation     ONSET DATE: Aug 2023   SUBJECTIVE:                                                                                                                                                                                            SUBJECTIVE STATEMENT: Pt relays some soreness after last session, wants to defer the DN today. Overall pain appears to have centralized to one local area in glute/piriformis   PERTINENT HISTORY:  History of injection in April 2023.  Had therapy prior to injection with some improvements.  DM, GERD, Gout, peripherial neuropathy, history of prostate cancer, WPW syrndrome   PAIN:  NPRS scale: at worst 3/10 in last week Pain location: Rt buttock/posterior leg Pain description: achy, numbness Aggravating factors: lying, prolonged standing (unsure of timeline), stairs Relieving factors: sitting   PRECAUTIONS: None   WEIGHT BEARING RESTRICTIONS: No   FALLS:  Has patient fallen in last 6 months? No   LIVING ENVIRONMENT: Lives in: House/apartment Stairs: 1 stair (uses door)     PLOF: Independent, golf (mobility problem limited - last time approx. 3 years), travel for vacation for boat   PATIENT GOALS: Reduce pain, improve mobility      OBJECTIVE:    DIAGNOSTIC FINDINGS:  12/10/2021 review of  chart:  XR of the lumbar spine from 11/20/2021 was independently reviewed and interpreted, showing disc height loss at multiple levels in the lumbar spine. Anterior osteophyte formation most prominently at L3/4 and L4/5. No fracture or dislocation. No evidence of instability on flexion/extension.    MRI of the lumbar spine from 04/09/2021 was independently reviewed and interpreted, showing DDD multiple levels. Lateral recess and central stenosis at L3/4. Lateral recess stenosis at L2/3, L4/5 and L5/S1.    PATIENT SURVEYS:  12/31/2021: 57  12/10/2021 FOTO eval:  49  predicted:  62   SCREENING FOR RED FLAGS: 12/10/2021 Bowel or bladder incontinence: No Cauda equina syndrome: No   COGNITION: 12/10/2021 Overall cognitive status: WFL normal                                      SENSATION: 12/10/2021 No specific testing today   MUSCLE LENGTH: 12/10/2021 Hamstrings: Right 60 deg; Left 60 deg     POSTURE: 12/10/2021  Very mild lateral truck shift to Lt in standing, easily corrected with no change in symptoms noted.    PALPATION: 12/10/2021 No specific tenderness in lumbar region to touch     LUMBAR ROM:    AROM 12/10/2021 12/31/2021  Flexion To knee joint, tightness in hamstrings noted   Extension 50 % WFL c mild buttock pain   Repeated x 5 in standing, improved to 75% c continued catch of buttock pain noted  -- no specific centralization or peripheralization noted 75% c end range symptoms Rt buttock mild  Repeated x 5 in standing - mild reduction in symptoms  Right lateral flexion To lateral knee jt no pain   Left lateral flexion To lateral knee jt no pain   Right rotation     Left rotation      (Blank rows = not tested)   LOWER EXTREMITY ROM:        Right 12/10/2021 Left 12/10/2021  Hip flexion      Hip extension      Hip abduction      Hip adduction      Hip internal rotation AROM WFL no complaint AROM WFL no  complaints  Hip external rotation      Knee flexion      Knee extension      Ankle dorsiflexion      Ankle plantarflexion      Ankle inversion      Ankle eversion       (Blank rows = not tested)   LOWER EXTREMITY MMT:     MMT Right 12/18/2021   Hip flexion      Hip extension 4   Hip abduction 4   Hip adduction      Hip internal rotation      Hip external rotation      Knee flexion      Knee extension      Ankle dorsiflexion      Ankle plantarflexion      Ankle inversion      Ankle eversion       (Blank rows = not tested)   LUMBAR SPECIAL TESTS:  12/10/2021 (-) slump test bilateral, (-) crossed SLR   FUNCTIONAL TESTS:  12/10/2021 18 inch chair s UE: multiple tries required but able to perform c leg bracing Lt and Rt SLS < 3 seconds each   GAIT: 12/10/2021 Independent ambulation c trendelenburg noted bilaterally Rt >  Lt   TODAY'S TREATMENT:                                                                                                                                DATE: 01/02/2022 Nu step X 8 min UE/LE L5 Supine single knee to chest stretch 15 sec  X 3 bilat Low trunk rotation 10 sec X 5 bilat Piriformis stretch 15 sec X 3 bilat Supine bridges X 10 Supine clams red X 15 with contralateral iso hold, then 15 bilat at same time Sidelying reverse clams X10 Sidelying clams X 10 Sidelying hip abd X10   Manual therapy for STM and trigger point release with Massage gun and tennis ball to Rt glutes/piriformis                                                                                                                               DATE: 12/31/2021 Therex: Standing lumbar extension x 5 Supine SKC 15 sec x 3 on Rt Supine piriformis figure 4 into flexion 15 sec x 3 on Rt Supine bridges X 10 Nustep lvl 6 8 mins UE/LE  Manual therapy for Massage gun to Rt glute med followed bu skilled palpation and active compression with Trigger Point Dry-Needling  Treatment  instructions: Expect mild to moderate muscle soreness. Patient Consent Given: Yes Education handout provided: previously provided Muscles treated: Rt piriformis and glute max Treatment response/outcome: good overall tolerance,twitch response noted c concordant symptoms                                                                                                                              DATE: 12/18/2021  Low trunk rotation 5 sec X 10 Piriformis stretch 30 sec X 3 bilat Supine bridges X 10 Sidelying clams X 15 Sidelying hip abd X10   Manual therapy for Massage gun  followed bu skilled palpation and active compression with Trigger Point Dry-Needling  Treatment instructions: Expect mild to moderate muscle soreness. Patient Consent Given: Yes Education handout provided: verbally provided Muscles treated: Rt piriformis and glutes Treatment response/outcome: good overall tolerance,twitch response noted      TODAY'S TREATMENT:                                                                                                                              DATE: 12/10/2021 Therex:    HEP instruction/performance c cues for techniques, handout provided.  Trial set performed of each for comprehension and symptom assessment.  See below for exercise list   PATIENT EDUCATION:  12/10/2021 Education details: HEP, POC Person educated: Patient Education method: Explanation, Demonstration, Verbal cues, and Handouts Education comprehension: verbalized understanding, returned demonstration, and verbal cues required   HOME EXERCISE PROGRAM: Access Code: [email or web address] URL: [email or web address] Date: 12/10/2021 Prepared by: Scot Jun   Exercises - Supine Lower Trunk Rotation  - 2-3 x daily - 7 x weekly - 1 sets - 3-5 reps - 15 hold - Supine Piriformis Stretch with Foot on Ground  - 2-3 x daily - 7 x weekly - 1 sets - 3-5 reps - 30 hold - Supine Figure 4 Piriformis Stretch  - 2-3 x  daily - 7 x weekly - 1 sets - 3-5 reps - 30 hold - Standing Lumbar Extension  - 2-3 x daily - 7 x weekly - 1 sets - 5-10 reps - Supine Bridge  - 1-2 x daily - 7 x weekly - 1-2 sets - 10 reps - 2 hold - Standing Hip Hiking  - 1-2 x daily - 7 x weekly - 1 sets - 10 reps - 5 hold   ASSESSMENT:   CLINICAL IMPRESSION: Pain appears to be centralizing and improving with PT. He did have increased soreness after DN last time so we held this today and did Soft tissue mobilization with massage gun and tennis ball instead.     OBJECTIVE IMPAIRMENTS: Abnormal gait, decreased activity tolerance, decreased balance, decreased endurance, decreased mobility, difficulty walking, decreased ROM, decreased strength, hypomobility, impaired perceived functional ability, impaired flexibility, improper body mechanics, and pain.    ACTIVITY LIMITATIONS: carrying, lifting, bending, standing, sleeping, stairs, transfers, and locomotion level   PARTICIPATION LIMITATIONS: interpersonal relationship, community activity, and occupation   PERSONAL FACTORS:  DM, GERD, Gout, peripherial neuropathy, history of prostate cancer, WPW syrndrome  are also affecting patient's functional outcome.    REHAB POTENTIAL: Good   CLINICAL DECISION MAKING: Stable/uncomplicated   EVALUATION COMPLEXITY: Low     GOALS: Goals reviewed with patient? Yes   SHORT TERM GOALS: (target date for Short term goals are 3 weeks 12/31/2021)   1. Patient will demonstrate independent use of home exercise program to maintain progress from in clinic treatments.   Goal status: Met   LONG TERM GOALS: (target dates for all long term goals are 10 weeks  02/18/2022 )  1. Patient will demonstrate/report pain at worst less than or equal to 2/10 to facilitate minimal limitation in daily activity secondary to pain symptoms.   Goal status: on going 12/31/2021   2. Patient will demonstrate independent use of home exercise program to facilitate ability to  maintain/progress functional gains from skilled physical therapy services.   Goal status:on going 12/31/2021   3. Patient will demonstrate FOTO outcome > or = 62 % to indicate reduced disability due to condition.   Goal status: on going 12/31/2021   4. Patient will demonstrate lumbar extension 100 % WFL s symptoms to facilitate upright standing, walking posture at PLOF s limitation.   Goal status: on going 12/31/2021   5.  Patient will demonstrate bilateral SLS > or = 10 seconds to facilitate stability in ambulation and daily activity.  Goal status: on going 12/31/2021   6.  Patient will demonstrate bilateral hip strength 5/5 throughout to facilitate usual standing/walking at PLOF.  Goal status: on going 12/31/2021     PLAN:   PT FREQUENCY: 1-2x/week   PT DURATION: 10 weeks   PLANNED INTERVENTIONS: Therapeutic exercises, Therapeutic activity, Neuro Muscular re-education, Balance training, Gait training, Patient/Family education, Joint mobilization, Stair training, DME instructions, Dry Needling, Electrical stimulation, Cryotherapy, vasopneumatic device, Moist heat, Taping, Traction Ultrasound, Ionotophoresis 27m/ml Dexamethasone, and Manual therapy.  All included unless contraindicated   PLAN FOR NEXT SESSION: Reassessment strength in hip.  DN as desired.   BElsie Ra PT, DPT 01/02/22 1:52 PM

## 2022-01-07 ENCOUNTER — Ambulatory Visit (INDEPENDENT_AMBULATORY_CARE_PROVIDER_SITE_OTHER): Payer: Medicare Other | Admitting: Rehabilitative and Restorative Service Providers"

## 2022-01-07 ENCOUNTER — Encounter: Payer: Self-pay | Admitting: Rehabilitative and Restorative Service Providers"

## 2022-01-07 DIAGNOSIS — M5459 Other low back pain: Secondary | ICD-10-CM

## 2022-01-07 DIAGNOSIS — M79604 Pain in right leg: Secondary | ICD-10-CM

## 2022-01-07 DIAGNOSIS — M6281 Muscle weakness (generalized): Secondary | ICD-10-CM | POA: Diagnosis not present

## 2022-01-07 DIAGNOSIS — R262 Difficulty in walking, not elsewhere classified: Secondary | ICD-10-CM | POA: Diagnosis not present

## 2022-01-07 NOTE — Therapy (Signed)
OUTPATIENT PHYSICAL THERAPY TREATMENT NOTE   Patient Name: Patrick Kidd MRN: [id number] DOB:[birth year], 76 y.o., male Today's Date: 01/07/2022  END OF SESSION:   PT End of Session - 01/07/22 1400     Visit Number 5    Number of Visits 20    Date for PT Re-Evaluation 02/18/22    Authorization Type Medicare and BCBS    Progress Note Due on Visit 10    PT Start Time 5397    PT Stop Time 1425    PT Time Calculation (min) 43 min    Activity Tolerance Patient tolerated treatment well    Behavior During Therapy WFL for tasks assessed/performed               Past Medical History:  Diagnosis Date   Acrophobia    Alcoholic cirrhosis ([place]) 67/34/1937   Anemia    Arthritis    foot by big toe   BPH associated with nocturia    Cataract    removed both eyes   Chronic cough    PMH of   CLL (chronic lymphocytic leukemia) ([place])    COVID-19 12/2018   Diabetes mellitus without complication ([place])    Diverticulosis 07/03/2010   Colonoscopy.    Duodenal ulcer 2017   Fallen arches    Bilateral   GERD (gastroesophageal reflux disease)    Gout    Granuloma annulare    Hx of adenomatous colonic polyps multiple   Hydrocele 2011   Large septated right hydrocele   Liver cyst    Liver lesion    Nonspecific elevation of levels of transaminase or lactic acid dehydrogenase (LDH)    Obesity    Peripheral neuropathy    Plantar fasciitis    PMH of   Portal hypertension ([place]) 2017   Prostate cancer ([place])    Right shoulder pain 11/2017   Sleep apnea    no cpap, patient denies   Thiamine deficiency    Wears reading eyeglasses    WPW (Wolff-Parkinson-White syndrome) 02/05/2019   Past Surgical History:  Procedure Laterality Date   CATARACT EXTRACTION, BILATERAL  12/2011    Dr Gershon Crane   COLONOSCOPY  2017   COLONOSCOPY W/ POLYPECTOMY  07/03/2010   2 adenomas, diverticulosis on right. Dr [name]   CYSTOSCOPY N/A 03/15/2018   Procedure: [name];  Surgeon: [name], MD;  Location: [facility];  Service: Urology;  Laterality: N/A;  NO SEEDS FOUND IN BLADDER   ESOPHAGOGASTRODUODENOSCOPY (EGD) WITH PROPOFOL N/A 01/08/2016   Procedure: ESOPHAGOGASTRODUODENOSCOPY (EGD) WITH PROPOFOL;  Surgeon: [name], MD;  Location: [facility];  Service: Endoscopy;  Laterality: N/A;   [place]   RADIOACTIVE SEED IMPLANT N/A 03/15/2018   Procedure: RADIOACTIVE SEED IMPLANT/BRACHYTHERAPY IMPLANT;  Surgeon: [name], MD;  Location: [place];  Service: Urology;  Laterality: N/A;   73 SEEDS IMPLANTED   SIGMOIDOSCOPY     SPACE OAR INSTILLATION N/A 03/15/2018   Procedure: SPACE OAR INSTILLATION;  Surgeon: [name], MD;  Location: [facility];  Service: Urology;  Laterality: N/A;   WISDOM TOOTH EXTRACTION     Patient Active Problem List   Diagnosis Date Noted   Chronic pain of right hip 11/05/2021   Urinary incontinence 06/25/2021   Degenerative lumbar spinal stenosis 04/30/2021   Degenerative arthritis of knee, bilateral 03/27/2021   Chronic lymphocytic leukemia ([place]) 11/08/2020   Insulin-requiring or dependent type II diabetes mellitus ([place])  09/25/2020   Stage 3a chronic kidney disease ([place]) 03/15/2020   Thiamine deficiency    Paroxysmal atrial fibrillation (H[facility]) 07/19/2019   Allergic rhinitis 06/09/2019   Erectile dysfunction due to diabetes mellitus ([place]) 06/09/2019   Overweight with body mass index (BMI) of 28 to 28.9 in adult 03/17/2019   WPW (Wolff-Parkinson-White syndrome) 02/05/2019   SBP (spontaneous bacterial peritonitis) (HCC)-December 2020,     Encephalopathy, hepatic ([place])    Degenerative disc disease, cervical 07/21/2018   Cervical radiculopathy 07/16/2018   OAB (overactive bladder) 07/06/2018   Malignant neoplasm of prostate ([place]) 01/13/2018   Hyperlipidemia LDL goal <100 11/11/2017   Essential hypertension 11/11/2017   BPH associated with nocturia  11/11/2017   Erectile dysfunction due to arterial insufficiency 04/07/2017   Peripheral vascular disease ([place]) 31/49/7026   Alcoholic cirrhosis ([place]) 37/85/8850   Type 2 diabetes mellitus with complication, with long-term current use of insulin ([place]) 08/22/2015   Hypersomnolence 03/19/2014   Peripheral neuropathy 01/11/2013   Polyclonal gammopathy 01/11/2013   GERD 10/04/2009     THERAPY DIAG:  Other low back pain  Pain in right leg  Difficulty in walking, not elsewhere classified  Muscle weakness (generalized)  PCP: [name]. MD   REFERRING PROVIDER: Callie Fielding, MD   REFERRING DIAG: M54.16 (ICD-10-CM) - Radiculopathy, lumbar region   Rationale for Evaluation and Treatment: Rehabilitation     ONSET DATE: Aug 2023   SUBJECTIVE:                                                                                                                                                                                            SUBJECTIVE STATEMENT: Pt indicated soreness from needling wore off and was feeling better.  Pt indicated he was walking a lot on Saturday and felt pain increase in buttock and down Rt leg.  Up to 6/10 with symptoms over the weekend.  Pt indicated feeling like HEP has helped manage symptoms some in time.      PERTINENT HISTORY:  History of injection in April 2023.  Had therapy prior to injection with some improvements.  DM, GERD, Gout, peripherial neuropathy, history of prostate cancer, WPW syrndrome   PAIN:  NPRS scale: 4/10 Pain location: Rt buttock/posterior leg Pain description: achy, numbness Aggravating factors: lying, prolonged standing (unsure of timeline), stairs Relieving factors: sitting   PRECAUTIONS: None   WEIGHT BEARING RESTRICTIONS: No   FALLS:  Has patient fallen in last 6 months? No   LIVING ENVIRONMENT: Lives in: House/apartment Stairs: 1 stair (uses door)     PLOF: Independent, golf (mobility problem limited - last  time approx.  3 years), travel for vacation for boat   PATIENT GOALS: Reduce pain, improve mobility     OBJECTIVE:    DIAGNOSTIC FINDINGS:  12/10/2021 review of chart:  XR of the lumbar spine from 11/20/2021 was independently reviewed and interpreted, showing disc height loss at multiple levels in the lumbar spine. Anterior osteophyte formation most prominently at L3/4 and L4/5. No fracture or dislocation. No evidence of instability on flexion/extension.    MRI of the lumbar spine from 04/09/2021 was independently reviewed and interpreted, showing DDD multiple levels. Lateral recess and central stenosis at L3/4. Lateral recess stenosis at L2/3, L4/5 and L5/S1.    PATIENT SURVEYS:  12/31/2021: 57  12/10/2021 FOTO eval:  49  predicted:  62   SCREENING FOR RED FLAGS: 12/10/2021 Bowel or bladder incontinence: No Cauda equina syndrome: No   COGNITION: 12/10/2021 Overall cognitive status: WFL normal                                      SENSATION: 12/10/2021 No specific testing today   MUSCLE LENGTH: 12/10/2021 Hamstrings: Right 60 deg; Left 60 deg     POSTURE: 12/10/2021  Very mild lateral truck shift to Lt in standing, easily corrected with no change in symptoms noted.    PALPATION: 12/10/2021 No specific tenderness in lumbar region to touch     LUMBAR ROM:    AROM 12/10/2021 12/31/2021 01/07/2022  Flexion To knee joint, tightness in hamstrings noted  To mid shin without pain change.   Extension 50 % WFL c mild buttock pain   Repeated x 5 in standing, improved to 75% c continued catch of buttock pain noted  -- no specific centralization or peripheralization noted 75% c end range symptoms Rt buttock mild  Repeated x 5 in standing - mild reduction in symptoms 75 % WFL c end range c pain present but no centralization or peripheralization with 5 reps   Right lateral flexion To lateral knee jt no pain    Left lateral flexion To lateral knee jt no pain    Right rotation      Left  rotation       (Blank rows = not tested)   LOWER EXTREMITY ROM:        Right 12/10/2021 Left 12/10/2021  Hip flexion      Hip extension      Hip abduction      Hip adduction      Hip internal rotation AROM WFL no complaint AROM WFL no complaints  Hip external rotation      Knee flexion      Knee extension      Ankle dorsiflexion      Ankle plantarflexion      Ankle inversion      Ankle eversion       (Blank rows = not tested)   LOWER EXTREMITY MMT:     MMT Right 12/18/2021 Right 01/07/2022  Hip flexion      Hip extension 4 4+/5  Hip abduction 4 4/5  Hip adduction      Hip internal rotation      Hip external rotation      Knee flexion      Knee extension      Ankle dorsiflexion      Ankle plantarflexion      Ankle inversion      Ankle eversion       (  Blank rows = not tested)   LUMBAR SPECIAL TESTS:  01/07/2022 (-) slump bilateral for radicular symptoms.   12/10/2021 (-) slump test bilateral, (-) crossed SLR   FUNCTIONAL TESTS:  12/10/2021 18 inch chair s UE: multiple tries required but able to perform c leg bracing Lt and Rt SLS < 3 seconds each   GAIT: 12/10/2021 Independent ambulation c trendelenburg noted bilaterally Rt > Lt   TODAY'S TREATMENT:                                                                                                                               DATE: 01/07/2022 Nu step X 8 min UE/LE L6 Supine single knee to chest stretch 15 sec  X 3 bilat Piriformis stretch 15 sec X 3 bilat Supine bridges x 15 c green band hip abduction hold  Sidelying reverse clam shell Rt leg moving x 15 Supine green band around knees isometric clam shell x 15 bilateral   Manual therapy for Massage gun to Rt glute med, skilled palpation and active compression with Trigger Point Dry-Needling  Treatment instructions: Expect mild to moderate muscle soreness. Patient Consent Given: Yes Education handout provided: previously provided Muscles treated: Rt piriformis  and glute max Treatment response/outcome: good overall tolerance,twitch response noted c concordant symptoms  TODAY'S TREATMENT:                                                                                                                               DATE: 01/02/2022 Nu step X 8 min UE/LE L5 Supine single knee to chest stretch 15 sec  X 3 bilat Low trunk rotation 10 sec X 5 bilat Piriformis stretch 15 sec X 3 bilat Supine bridges X 10 Supine clams red X 15 with contralateral iso hold, then 15 bilat at same time Sidelying reverse clams X10 Sidelying clams X 10 Sidelying hip abd X10   Manual therapy for STM and trigger point release with Massage gun and tennis ball to Rt glutes/piriformis  TODAY'S TREATMENT:  DATE: 12/31/2021 Therex: Standing lumbar extension x 5 Supine SKC 15 sec x 3 on Rt Supine piriformis figure 4 into flexion 15 sec x 3 on Rt Supine bridges X 10 Nustep lvl 6 8 mins UE/LE  Manual therapy for Massage gun to Rt glute med followed bu skilled palpation and active compression with Trigger Point Dry-Needling  Treatment instructions: Expect mild to moderate muscle soreness. Patient Consent Given: Yes Education handout provided: previously provided Muscles treated: Rt piriformis and glute max Treatment response/outcome: good overall tolerance,twitch response noted c concordant symptoms     PATIENT EDUCATION:  12/10/2021 Education details: HEP, POC Person educated: Patient Education method: Consulting civil engineer, Demonstration, Verbal cues, and Handouts Education comprehension: verbalized understanding, returned demonstration, and verbal cues required   HOME EXERCISE PROGRAM: Access Code: [email or web address] URL: [email or web address] Date: 12/10/2021 Prepared by: Scot Jun   Exercises - Supine Lower Trunk Rotation  - 2-3 x daily - 7 x  weekly - 1 sets - 3-5 reps - 15 hold - Supine Piriformis Stretch with Foot on Ground  - 2-3 x daily - 7 x weekly - 1 sets - 3-5 reps - 30 hold - Supine Figure 4 Piriformis Stretch  - 2-3 x daily - 7 x weekly - 1 sets - 3-5 reps - 30 hold - Standing Lumbar Extension  - 2-3 x daily - 7 x weekly - 1 sets - 5-10 reps - Supine Bridge  - 1-2 x daily - 7 x weekly - 1-2 sets - 10 reps - 2 hold - Standing Hip Hiking  - 1-2 x daily - 7 x weekly - 1 sets - 10 reps - 5 hold   ASSESSMENT:   CLINICAL IMPRESSION: Worsened symptoms after prolonged standing/walking activity.  Recheck of strength showed mild gains but weakness still noted in back/Rt hip.  Continued skilled PT services c dry needling/manual intervention for symptom relief paired c use of HEP and strengthening indicated at this time.     OBJECTIVE IMPAIRMENTS: Abnormal gait, decreased activity tolerance, decreased balance, decreased endurance, decreased mobility, difficulty walking, decreased ROM, decreased strength, hypomobility, impaired perceived functional ability, impaired flexibility, improper body mechanics, and pain.    ACTIVITY LIMITATIONS: carrying, lifting, bending, standing, sleeping, stairs, transfers, and locomotion level   PARTICIPATION LIMITATIONS: interpersonal relationship, community activity, and occupation   PERSONAL FACTORS:  DM, GERD, Gout, peripherial neuropathy, history of prostate cancer, WPW syrndrome  are also affecting patient's functional outcome.    REHAB POTENTIAL: Good   CLINICAL DECISION MAKING: Stable/uncomplicated   EVALUATION COMPLEXITY: Low     GOALS: Goals reviewed with patient? Yes   SHORT TERM GOALS: (target date for Short term goals are 3 weeks 12/31/2021)   1. Patient will demonstrate independent use of home exercise program to maintain progress from in clinic treatments.   Goal status: Met   LONG TERM GOALS: (target dates for all long term goals are 10 weeks  02/18/2022 )   1. Patient will  demonstrate/report pain at worst less than or equal to 2/10 to facilitate minimal limitation in daily activity secondary to pain symptoms.   Goal status: on going 12/31/2021   2. Patient will demonstrate independent use of home exercise program to facilitate ability to maintain/progress functional gains from skilled physical therapy services.   Goal status:on going 12/31/2021   3. Patient will demonstrate FOTO outcome > or = 62 % to indicate reduced disability due to condition.   Goal status: on going 12/31/2021   4. Patient will demonstrate lumbar extension 100 %  WFL s symptoms to facilitate upright standing, walking posture at PLOF s limitation.   Goal status: on going 12/31/2021   5.  Patient will demonstrate bilateral SLS > or = 10 seconds to facilitate stability in ambulation and daily activity.  Goal status: on going 12/31/2021   6.  Patient will demonstrate bilateral hip strength 5/5 throughout to facilitate usual standing/walking at PLOF.  Goal status: on going 12/31/2021     PLAN:   PT FREQUENCY: 1-2x/week   PT DURATION: 10 weeks   PLANNED INTERVENTIONS: Therapeutic exercises, Therapeutic activity, Neuro Muscular re-education, Balance training, Gait training, Patient/Family education, Joint mobilization, Stair training, DME instructions, Dry Needling, Electrical stimulation, Cryotherapy, vasopneumatic device, Moist heat, Taping, Traction Ultrasound, Ionotophoresis 65m/ml Dexamethasone, and Manual therapy.  All included unless contraindicated   PLAN FOR NEXT SESSION: Progressive strengthening, manual for symptom relief as needed.  MScot Jun PT, DPT, OCS, ATC 01/07/22  2:27 PM

## 2022-01-09 ENCOUNTER — Ambulatory Visit (INDEPENDENT_AMBULATORY_CARE_PROVIDER_SITE_OTHER): Payer: Medicare Other | Admitting: Physical Therapy

## 2022-01-09 ENCOUNTER — Encounter: Payer: Self-pay | Admitting: Physical Therapy

## 2022-01-09 DIAGNOSIS — M5459 Other low back pain: Secondary | ICD-10-CM

## 2022-01-09 DIAGNOSIS — R262 Difficulty in walking, not elsewhere classified: Secondary | ICD-10-CM

## 2022-01-09 DIAGNOSIS — M79604 Pain in right leg: Secondary | ICD-10-CM | POA: Diagnosis not present

## 2022-01-09 DIAGNOSIS — M6281 Muscle weakness (generalized): Secondary | ICD-10-CM | POA: Diagnosis not present

## 2022-01-09 NOTE — Therapy (Signed)
OUTPATIENT PHYSICAL THERAPY TREATMENT NOTE   Patient Name: Patrick Kidd MRN: [id number] DOB:03-09-1945, 76 y.o., male Today's Date: 01/09/2022  END OF SESSION:   PT End of Session - 01/09/22 1407     Visit Number 6    Number of Visits 20    Date for PT Re-Evaluation 02/18/22    Authorization Type Medicare and BCBS    Progress Note Due on Visit 10    PT Start Time 1342    PT Stop Time 1425    PT Time Calculation (min) 43 min    Activity Tolerance Patient tolerated treatment well    Behavior During Therapy WFL for tasks assessed/performed                Past Medical History:  Diagnosis Date   Acrophobia    Alcoholic cirrhosis ([place]) 93/79/0240   Anemia    Arthritis    foot by big toe   BPH associated with nocturia    Cataract    removed both eyes   Chronic cough    PMH of   CLL (chronic lymphocytic leukemia) ([place])    COVID-19 12/2018   Diabetes mellitus without complication ([place])    Diverticulosis 07/03/2010   Colonoscopy.    Duodenal ulcer 2017   Fallen arches    Bilateral   GERD (gastroesophageal reflux disease)    Gout    Granuloma annulare    Hx of adenomatous colonic polyps multiple   Hydrocele 2011   Large septated right hydrocele   Liver cyst    Liver lesion    Nonspecific elevation of levels of transaminase or lactic acid dehydrogenase (LDH)    Obesity    Peripheral neuropathy    Plantar fasciitis    PMH of   Portal hypertension ([place]) 2017   Prostate cancer ([place])    Right shoulder pain 11/2017   Sleep apnea    no cpap, patient denies   Thiamine deficiency    Wears reading eyeglasses    WPW (Wolff-Parkinson-White syndrome) 02/05/2019   Past Surgical History:  Procedure Laterality Date   CATARACT EXTRACTION, BILATERAL  12/2011    Dr Gershon Crane   COLONOSCOPY  2017   COLONOSCOPY W/ POLYPECTOMY  07/03/2010   2 adenomas, diverticulosis on right. Dr [name]   CYSTOSCOPY N/A 03/15/2018   Procedure: [name];  Surgeon: [name], MD;  Location: [facility];  Service: Urology;  Laterality: N/A;  NO SEEDS FOUND IN BLADDER   ESOPHAGOGASTRODUODENOSCOPY (EGD) WITH PROPOFOL N/A 01/08/2016   Procedure: ESOPHAGOGASTRODUODENOSCOPY (EGD) WITH PROPOFOL;  Surgeon: [name], MD;  Location: [facility];  Service: Endoscopy;  Laterality: N/A;   [place]   RADIOACTIVE SEED IMPLANT N/A 03/15/2018   Procedure: RADIOACTIVE SEED IMPLANT/BRACHYTHERAPY IMPLANT;  Surgeon: [name], MD;  Location: [facility];  Service: Urology;  Laterality: N/A;   73 SEEDS IMPLANTED   SIGMOIDOSCOPY     SPACE OAR INSTILLATION N/A 03/15/2018   Procedure: SPACE OAR INSTILLATION;  Surgeon: [name], MD;  Location: [facility];  Service: Urology;  Laterality: N/A;   WISDOM TOOTH EXTRACTION     Patient Active Problem List   Diagnosis Date Noted   Chronic pain of right hip 11/05/2021   Urinary incontinence 06/25/2021   Degenerative lumbar spinal stenosis 04/30/2021   Degenerative arthritis of knee, bilateral 03/27/2021   Chronic lymphocytic leukemia ([place]) 11/08/2020   Insulin-requiring or dependent type II diabetes mellitus (  [place]) 09/25/2020   Stage 3a chronic kidney disease ([place]) 03/15/2020   Thiamine deficiency    Paroxysmal atrial fibrillation ([place]) 07/19/2019   Allergic rhinitis 06/09/2019   Erectile dysfunction due to diabetes mellitus ([place]) 06/09/2019   Overweight with body mass index (BMI) of 28 to 28.9 in adult 03/17/2019   WPW (Wolff-Parkinson-White syndrome) 02/05/2019   SBP (spontaneous bacterial peritonitis) (H[facility])-December 2020,     Encephalopathy, hepatic ([place])    Degenerative disc disease, cervical 07/21/2018   Cervical radiculopathy 07/16/2018   OAB (overactive bladder) 07/06/2018   Malignant neoplasm of prostate ([place]) 01/13/2018   Hyperlipidemia LDL goal <100 11/11/2017   Essential hypertension 11/11/2017   BPH associated with nocturia  11/11/2017   Erectile dysfunction due to arterial insufficiency 04/07/2017   Peripheral vascular disease ([place]) 50/93/2671   Alcoholic cirrhosis ([place]) 24/58/0998   Type 2 diabetes mellitus with complication, with long-term current use of insulin ([place]) 08/22/2015   Hypersomnolence 03/19/2014   Peripheral neuropathy 01/11/2013   Polyclonal gammopathy 01/11/2013   GERD 10/04/2009     THERAPY DIAG:  Other low back pain  Pain in right leg  Difficulty in walking, not elsewhere classified  Muscle weakness (generalized)  PCP: [name]. MD   REFERRING PROVIDER: Callie Fielding, MD   REFERRING DIAG: M54.16 (ICD-10-CM) - Radiculopathy, lumbar region   Rationale for Evaluation and Treatment: Rehabilitation     ONSET DATE: Aug 2023   SUBJECTIVE:                                                                                                                                                                                            SUBJECTIVE STATEMENT: Pt indicated soreness from needling wore off and was feeling better. Overall can tell improvement with PT.   PERTINENT HISTORY:  History of injection in April 2023.  Had therapy prior to injection with some improvements.  DM, GERD, Gout, peripherial neuropathy, history of prostate cancer, WPW syrndrome   PAIN:  NPRS scale: 4/10 Pain location: Rt buttock/posterior leg Pain description: achy, numbness Aggravating factors: lying, prolonged standing (unsure of timeline), stairs Relieving factors: sitting   PRECAUTIONS: None   WEIGHT BEARING RESTRICTIONS: No   FALLS:  Has patient fallen in last 6 months? No   LIVING ENVIRONMENT: Lives in: House/apartment Stairs: 1 stair (uses door)     PLOF: Independent, golf (mobility problem limited - last time approx. 3 years), travel for vacation for boat   PATIENT GOALS: Reduce pain, improve mobility     OBJECTIVE:    DIAGNOSTIC FINDINGS:  12/10/2021 review of chart:  XR of  the lumbar spine from 11/20/2021  was independently reviewed and interpreted, showing disc height loss at multiple levels in the lumbar spine. Anterior osteophyte formation most prominently at L3/4 and L4/5. No fracture or dislocation. No evidence of instability on flexion/extension.    MRI of the lumbar spine from 04/09/2021 was independently reviewed and interpreted, showing DDD multiple levels. Lateral recess and central stenosis at L3/4. Lateral recess stenosis at L2/3, L4/5 and L5/S1.    PATIENT SURVEYS:  12/31/2021: 57  12/10/2021 FOTO eval:  49  predicted:  62   SCREENING FOR RED FLAGS: 12/10/2021 Bowel or bladder incontinence: No Cauda equina syndrome: No   COGNITION: 12/10/2021 Overall cognitive status: WFL normal                                      SENSATION: 12/10/2021 No specific testing today   MUSCLE LENGTH: 12/10/2021 Hamstrings: Right 60 deg; Left 60 deg     POSTURE: 12/10/2021  Very mild lateral truck shift to Lt in standing, easily corrected with no change in symptoms noted.    PALPATION: 12/10/2021 No specific tenderness in lumbar region to touch     LUMBAR ROM:    AROM 12/10/2021 12/31/2021 01/07/2022  Flexion To knee joint, tightness in hamstrings noted  To mid shin without pain change.   Extension 50 % WFL c mild buttock pain   Repeated x 5 in standing, improved to 75% c continued catch of buttock pain noted  -- no specific centralization or peripheralization noted 75% c end range symptoms Rt buttock mild  Repeated x 5 in standing - mild reduction in symptoms 75 % WFL c end range c pain present but no centralization or peripheralization with 5 reps   Right lateral flexion To lateral knee jt no pain    Left lateral flexion To lateral knee jt no pain    Right rotation      Left rotation       (Blank rows = not tested)   LOWER EXTREMITY ROM:        Right 12/10/2021 Left 12/10/2021  Hip flexion      Hip extension      Hip abduction      Hip adduction       Hip internal rotation AROM WFL no complaint AROM WFL no complaints  Hip external rotation      Knee flexion      Knee extension      Ankle dorsiflexion      Ankle plantarflexion      Ankle inversion      Ankle eversion       (Blank rows = not tested)   LOWER EXTREMITY MMT:     MMT Right 12/18/2021 Right 01/07/2022  Hip flexion      Hip extension 4 4+/5  Hip abduction 4 4/5  Hip adduction      Hip internal rotation      Hip external rotation      Knee flexion      Knee extension      Ankle dorsiflexion      Ankle plantarflexion      Ankle inversion      Ankle eversion       (Blank rows = not tested)   LUMBAR SPECIAL TESTS:  01/07/2022 (-) slump bilateral for radicular symptoms.   12/10/2021 (-) slump test bilateral, (-) crossed SLR   FUNCTIONAL TESTS:  12/10/2021 18 inch chair s  UE: multiple tries required but able to perform c leg bracing Lt and Rt SLS < 3 seconds each   GAIT: 12/10/2021 Independent ambulation c trendelenburg noted bilaterally Rt > Lt                                                                                                                              TODAY'S TREATMENT:                                                                                                                               DATE: 01/09/2022 Nu step X 8 min UE/LE L5 Supine single knee to chest stretch 20 sec  X 3 bilat Low trunk rotation 10 sec X 5 bilat Piriformis stretch 15 sec X 3 bilat Supine bridges x 15 c green band hip abduction hold  Supine clams greenX 15 with contralateral iso hold, Sidelying reverse clams X15 Sidelying clams X 15 Sidelying hip abd X10   Manual therapy for STM and trigger point release as well as Massage gun to Rt glutes/piriformis     DATE: 01/07/2022 Nu step X 8 min UE/LE L6 Supine single knee to chest stretch 15 sec  X 3 bilat Piriformis stretch 15 sec X 3 bilat Supine bridges x 15 c green band hip abduction hold  Sidelying reverse clam  shell Rt leg moving x 15 Supine green band around knees isometric clam shell x 15 bilateral   Manual therapy for Massage gun to Rt glute med, skilled palpation and active compression with Trigger Point Dry-Needling  Treatment instructions: Expect mild to moderate muscle soreness. Patient Consent Given: Yes Education handout provided: previously provided Muscles treated: Rt piriformis and glute max Treatment response/outcome: good overall tolerance,twitch response noted c concordant symptoms   PATIENT EDUCATION:  12/10/2021 Education details: HEP, POC Person educated: Patient Education method: Consulting civil engineer, Demonstration, Verbal cues, and Handouts Education comprehension: verbalized understanding, returned demonstration, and verbal cues required   HOME EXERCISE PROGRAM: Access Code: [email or web address] URL: [email or web address] Date: 12/10/2021 Prepared by: Scot Jun   Exercises - Supine Lower Trunk Rotation  - 2-3 x daily - 7 x weekly - 1 sets - 3-5 reps - 15 hold - Supine Piriformis Stretch with Foot on Ground  - 2-3 x daily - 7 x weekly - 1 sets - 3-5 reps - 30 hold - Supine Figure 4 Piriformis Stretch  - 2-3 x daily -  7 x weekly - 1 sets - 3-5 reps - 30 hold - Standing Lumbar Extension  - 2-3 x daily - 7 x weekly - 1 sets - 5-10 reps - Supine Bridge  - 1-2 x daily - 7 x weekly - 1-2 sets - 10 reps - 2 hold - Standing Hip Hiking  - 1-2 x daily - 7 x weekly - 1 sets - 10 reps - 5 hold   ASSESSMENT:   CLINICAL IMPRESSION: He has made overall improvement with PT, he does get sore after DN but overall appears to improve his symptoms.      OBJECTIVE IMPAIRMENTS: Abnormal gait, decreased activity tolerance, decreased balance, decreased endurance, decreased mobility, difficulty walking, decreased ROM, decreased strength, hypomobility, impaired perceived functional ability, impaired flexibility, improper body mechanics, and pain.    ACTIVITY LIMITATIONS: carrying, lifting,  bending, standing, sleeping, stairs, transfers, and locomotion level   PARTICIPATION LIMITATIONS: interpersonal relationship, community activity, and occupation   PERSONAL FACTORS:  DM, GERD, Gout, peripherial neuropathy, history of prostate cancer, WPW syrndrome  are also affecting patient's functional outcome.    REHAB POTENTIAL: Good   CLINICAL DECISION MAKING: Stable/uncomplicated   EVALUATION COMPLEXITY: Low     GOALS: Goals reviewed with patient? Yes   SHORT TERM GOALS: (target date for Short term goals are 3 weeks 12/31/2021)   1. Patient will demonstrate independent use of home exercise program to maintain progress from in clinic treatments.   Goal status: Met   LONG TERM GOALS: (target dates for all long term goals are 10 weeks  02/18/2022 )   1. Patient will demonstrate/report pain at worst less than or equal to 2/10 to facilitate minimal limitation in daily activity secondary to pain symptoms.   Goal status: on going 12/31/2021   2. Patient will demonstrate independent use of home exercise program to facilitate ability to maintain/progress functional gains from skilled physical therapy services.   Goal status:on going 12/31/2021   3. Patient will demonstrate FOTO outcome > or = 62 % to indicate reduced disability due to condition.   Goal status: on going 12/31/2021   4. Patient will demonstrate lumbar extension 100 % WFL s symptoms to facilitate upright standing, walking posture at PLOF s limitation.   Goal status: on going 12/31/2021   5.  Patient will demonstrate bilateral SLS > or = 10 seconds to facilitate stability in ambulation and daily activity.  Goal status: on going 12/31/2021   6.  Patient will demonstrate bilateral hip strength 5/5 throughout to facilitate usual standing/walking at PLOF.  Goal status: on going 12/31/2021     PLAN:   PT FREQUENCY: 1-2x/week   PT DURATION: 10 weeks   PLANNED INTERVENTIONS: Therapeutic exercises, Therapeutic  activity, Neuro Muscular re-education, Balance training, Gait training, Patient/Family education, Joint mobilization, Stair training, DME instructions, Dry Needling, Electrical stimulation, Cryotherapy, vasopneumatic device, Moist heat, Taping, Traction Ultrasound, Ionotophoresis 90m/ml Dexamethasone, and Manual therapy.  All included unless contraindicated   PLAN FOR NEXT SESSION: Has one more PT visit left so assess for recert vs DC to independent program  BElsie Ra PT, DPT 01/09/22 2:52 PM

## 2022-01-21 ENCOUNTER — Ambulatory Visit (INDEPENDENT_AMBULATORY_CARE_PROVIDER_SITE_OTHER): Payer: Medicare Other | Admitting: Rehabilitative and Restorative Service Providers"

## 2022-01-21 DIAGNOSIS — R262 Difficulty in walking, not elsewhere classified: Secondary | ICD-10-CM | POA: Diagnosis not present

## 2022-01-21 DIAGNOSIS — M79604 Pain in right leg: Secondary | ICD-10-CM

## 2022-01-21 DIAGNOSIS — M6281 Muscle weakness (generalized): Secondary | ICD-10-CM | POA: Diagnosis not present

## 2022-01-21 DIAGNOSIS — M5459 Other low back pain: Secondary | ICD-10-CM

## 2022-01-21 NOTE — Therapy (Addendum)
OUTPATIENT PHYSICAL THERAPY TREATMENT NOTE /DISCHARGE   Patient Name: Patrick Kidd MRN: [id number] DOB:May 28, 1945, 76 y.o., male Today's Date: 01/21/2022  END OF SESSION:   PT End of Session - 01/21/22 1351     Visit Number 7    Number of Visits 20    Date for PT Re-Evaluation 02/18/22    Authorization Type Medicare and BCBS    Progress Note Due on Visit 10    PT Start Time 1347    PT Stop Time 1425    PT Time Calculation (min) 38 min    Activity Tolerance Patient tolerated treatment well    Behavior During Therapy WFL for tasks assessed/performed                 Past Medical History:  Diagnosis Date   Acrophobia    Alcoholic cirrhosis ([place]) 37/85/8850   Anemia    Arthritis    foot by big toe   BPH associated with nocturia    Cataract    removed both eyes   Chronic cough    PMH of   CLL (chronic lymphocytic leukemia) ([place])    COVID-19 12/2018   Diabetes mellitus without complication ([place])    Diverticulosis 07/03/2010   Colonoscopy.    Duodenal ulcer 2017   Fallen arches    Bilateral   GERD (gastroesophageal reflux disease)    Gout    Granuloma annulare    Hx of adenomatous colonic polyps multiple   Hydrocele 2011   Large septated right hydrocele   Liver cyst    Liver lesion    Nonspecific elevation of levels of transaminase or lactic acid dehydrogenase (LDH)    Obesity    Peripheral neuropathy    Plantar fasciitis    PMH of   Portal hypertension ([place]) 2017   Prostate cancer ([place])    Right shoulder pain 11/2017   Sleep apnea    no cpap, patient denies   Thiamine deficiency    Wears reading eyeglasses    WPW (Wolff-Parkinson-White syndrome) 02/05/2019   Past Surgical History:  Procedure Laterality Date   CATARACT EXTRACTION, BILATERAL  12/2011    Dr Gershon Crane   COLONOSCOPY  2017   COLONOSCOPY W/ POLYPECTOMY  07/03/2010   2 adenomas, diverticulosis on right. Dr [name]   CYSTOSCOPY N/A 03/15/2018   Procedure: [name];   Surgeon: [name], MD;  Location: [facility];  Service: Urology;  Laterality: N/A;  NO SEEDS FOUND IN BLADDER   ESOPHAGOGASTRODUODENOSCOPY (EGD) WITH PROPOFOL N/A 01/08/2016   Procedure: ESOPHAGOGASTRODUODENOSCOPY (EGD) WITH PROPOFOL;  Surgeon: [name], MD;  Location: [facility];  Service: Endoscopy;  Laterality: N/A;   [place]   RADIOACTIVE SEED IMPLANT N/A 03/15/2018   Procedure: RADIOACTIVE SEED IMPLANT/BRACHYTHERAPY IMPLANT;  Surgeon: [name], MD;  Location: [facility];  Service: Urology;  Laterality: N/A;   73 SEEDS IMPLANTED   SIGMOIDOSCOPY     SPACE OAR INSTILLATION N/A 03/15/2018   Procedure: SPACE OAR INSTILLATION;  Surgeon: [name], MD;  Location: [facility];  Service: Urology;  Laterality: N/A;   WISDOM TOOTH EXTRACTION     Patient Active Problem List   Diagnosis Date Noted   Chronic pain of right hip 11/05/2021   Urinary incontinence 06/25/2021   Degenerative lumbar spinal stenosis 04/30/2021   Degenerative arthritis of knee, bilateral 03/27/2021   Chronic lymphocytic leukemia ([place]) 11/08/2020   Insulin-requiring or dependent type II  diabetes mellitus ([place]) 09/25/2020   Stage 3a chronic kidney disease ([place]) 03/15/2020   Thiamine deficiency    Paroxysmal atrial fibrillation ([place]) 07/19/2019   Allergic rhinitis 06/09/2019   Erectile dysfunction due to diabetes mellitus ([place]) 06/09/2019   Overweight with body mass index (BMI) of 28 to 28.9 in adult 03/17/2019   WPW (Wolff-Parkinson-White syndrome) 02/05/2019   SBP (spontaneous bacterial peritonitis) (H[facility])-December 2020,     Encephalopathy, hepatic ([place])    Degenerative disc disease, cervical 07/21/2018   Cervical radiculopathy 07/16/2018   OAB (overactive bladder) 07/06/2018   Malignant neoplasm of prostate ([place]) 01/13/2018   Hyperlipidemia LDL goal <100 11/11/2017   Essential hypertension 11/11/2017   BPH associated  with nocturia 11/11/2017   Erectile dysfunction due to arterial insufficiency 04/07/2017   Peripheral vascular disease ([place]) 93/23/5573   Alcoholic cirrhosis ([place]) 22/03/5425   Type 2 diabetes mellitus with complication, with long-term current use of insulin ([place]) 08/22/2015   Hypersomnolence 03/19/2014   Peripheral neuropathy 01/11/2013   Polyclonal gammopathy 01/11/2013   GERD 10/04/2009     THERAPY DIAG:  Other low back pain  Pain in right leg  Difficulty in walking, not elsewhere classified  Muscle weakness (generalized)  PCP: [name]. MD   REFERRING PROVIDER: Callie Fielding, MD   REFERRING DIAG: M54.16 (ICD-10-CM) - Radiculopathy, lumbar region   Rationale for Evaluation and Treatment: Rehabilitation     ONSET DATE: Aug 2023   SUBJECTIVE:                                                                                                                                                                                            SUBJECTIVE STATEMENT: Pt indicated feeling the best he has in months.  Pt indicated having pain increase last Friday but got better after that.  Pt indicated he wanted to improve his balance.    PERTINENT HISTORY:  History of injection in April 2023.  Had therapy prior to injection with some improvements.  DM, GERD, Gout, peripherial neuropathy, history of prostate cancer, WPW syrndrome   PAIN:  NPRS scale: 4/10 Pain location: Rt buttock/posterior leg Pain description: achy, numbness Aggravating factors: lying, prolonged standing (unsure of timeline), stairs Relieving factors: sitting   PRECAUTIONS: None   WEIGHT BEARING RESTRICTIONS: No   FALLS:  Has patient fallen in last 6 months? No   LIVING ENVIRONMENT: Lives in: House/apartment Stairs: 1 stair (uses door)     PLOF: Independent, golf (mobility problem limited - last time approx. 3 years), travel for vacation for boat   PATIENT GOALS: Reduce pain, improve mobility      OBJECTIVE:  DIAGNOSTIC FINDINGS:  12/10/2021 review of chart:  XR of the lumbar spine from 11/20/2021 was independently reviewed and interpreted, showing disc height loss at multiple levels in the lumbar spine. Anterior osteophyte formation most prominently at L3/4 and L4/5. No fracture or dislocation. No evidence of instability on flexion/extension.    MRI of the lumbar spine from 04/09/2021 was independently reviewed and interpreted, showing DDD multiple levels. Lateral recess and central stenosis at L3/4. Lateral recess stenosis at L2/3, L4/5 and L5/S1.    PATIENT SURVEYS:  01/21/2022 59  12/31/2021: 57  12/10/2021 FOTO eval:  49  predicted:  62   SCREENING FOR RED FLAGS: 12/10/2021 Bowel or bladder incontinence: No Cauda equina syndrome: No   COGNITION: 12/10/2021 Overall cognitive status: WFL normal                                      SENSATION: 12/10/2021 No specific testing today   MUSCLE LENGTH: 12/10/2021 Hamstrings: Right 60 deg; Left 60 deg     POSTURE: 12/10/2021  Very mild lateral truck shift to Lt in standing, easily corrected with no change in symptoms noted.    PALPATION: 12/10/2021 No specific tenderness in lumbar region to touch     LUMBAR ROM:    AROM 12/10/2021 12/31/2021 01/07/2022  Flexion To knee joint, tightness in hamstrings noted  To mid shin without pain change.   Extension 50 % WFL c mild buttock pain   Repeated x 5 in standing, improved to 75% c continued catch of buttock pain noted  -- no specific centralization or peripheralization noted 75% c end range symptoms Rt buttock mild  Repeated x 5 in standing - mild reduction in symptoms 75 % WFL c end range c pain present but no centralization or peripheralization with 5 reps   Right lateral flexion To lateral knee jt no pain    Left lateral flexion To lateral knee jt no pain    Right rotation      Left rotation       (Blank rows = not tested)   LOWER EXTREMITY ROM:        Right 12/10/2021  Left 12/10/2021  Hip flexion      Hip extension      Hip abduction      Hip adduction      Hip internal rotation AROM WFL no complaint AROM WFL no complaints  Hip external rotation      Knee flexion      Knee extension      Ankle dorsiflexion      Ankle plantarflexion      Ankle inversion      Ankle eversion       (Blank rows = not tested)   LOWER EXTREMITY MMT:     MMT Right 12/18/2021 Right 01/07/2022  Hip flexion      Hip extension 4 4+/5  Hip abduction 4 4/5  Hip adduction      Hip internal rotation      Hip external rotation      Knee flexion      Knee extension      Ankle dorsiflexion      Ankle plantarflexion      Ankle inversion      Ankle eversion       (Blank rows = not tested)   LUMBAR SPECIAL TESTS:  01/07/2022 (-) slump bilateral for radicular symptoms.  12/10/2021 (-) slump test bilateral, (-) crossed SLR   FUNCTIONAL TESTS:  12/10/2021 18 inch chair s UE: multiple tries required but able to perform c leg bracing Lt and Rt SLS < 3 seconds each   GAIT: 12/10/2021 Independent ambulation c trendelenburg noted bilaterally Rt > Lt    TODAY'S TREATMENT:                                                                                                                            DATE: 01/21/2022 Manual therapy for STM and trigger point release as well as Massage gun to Rt glutes/piriformis  Therex: HEP review c cues verbally  Neuro Re-ed In corner with hand touching as necessary Tandem stance in corner modified 1 min x 1 bilateral Feet together stance in corner with eyes open forward 1 min Feet together stance in corner with eyes open head turns Lt and Rt 10 x each way, with rotation and cervical extension x 10 each side Feet hip width apart church pew fwd/back weight shifts 2 mins  TODAY'S TREATMENT:                                                                                                                              DATE: 01/09/2022 Nu step X 8  min UE/LE L5 Supine single knee to chest stretch 20 sec  X 3 bilat Low trunk rotation 10 sec X 5 bilat Piriformis stretch 15 sec X 3 bilat Supine bridges x 15 c green band hip abduction hold  Supine clams greenX 15 with contralateral iso hold, Sidelying reverse clams X15 Sidelying clams X 15 Sidelying hip abd X10   Manual therapy for STM and trigger point release as well as Massage gun to Rt glutes/piriformis   TODAY'S TREATMENT:  DATE: 01/07/2022 Nu step X 8 min UE/LE L6 Supine single knee to chest stretch 15 sec  X 3 bilat Piriformis stretch 15 sec X 3 bilat Supine bridges x 15 c green band hip abduction hold  Sidelying reverse clam shell Rt leg moving x 15 Supine green band around knees isometric clam shell x 15 bilateral   Manual therapy for Massage gun to Rt glute med, skilled palpation and active compression with Trigger Point Dry-Needling  Treatment instructions: Expect mild to moderate muscle soreness. Patient Consent Given: Yes Education handout provided: previously provided Muscles treated: Rt piriformis and glute max Treatment response/outcome: good overall tolerance,twitch response noted c concordant symptoms   PATIENT EDUCATION:  01/21/2022 Education details: HEP, POC Person educated: Patient Education method: Consulting civil engineer, Demonstration, Verbal cues, and Handouts Education comprehension: verbalized understanding, returned demonstration, and verbal cues required   HOME EXERCISE PROGRAM: Access Code: [email or web address] URL: [email or web address] Date: 01/21/2022 Prepared by: Scot Jun  Exercises - Supine Lower Trunk Rotation  - 2-3 x daily - 7 x weekly - 1 sets - 3-5 reps - 15 hold - Supine Piriformis Stretch with Foot on Ground  - 2-3 x daily - 7 x weekly - 1 sets - 3-5 reps - 30 hold - Supine Figure 4 Piriformis Stretch  - 2-3 x  daily - 7 x weekly - 1 sets - 3-5 reps - 30 hold - Standing Lumbar Extension  - 2-3 x daily - 7 x weekly - 1 sets - 5-10 reps - Supine Bridge  - 1-2 x daily - 7 x weekly - 1-2 sets - 10 reps - 2 hold - Standing Hip Hiking  - 1-2 x daily - 7 x weekly - 1 sets - 10 reps - 5 hold - Hooklying Clamshell with Resistance  - 1 x daily - 7 x weekly - 2-3 sets - 10-15 reps - Sidelying Reverse Clamshell (Mirrored)  - 1 x daily - 7 x weekly - 2-3 sets - 10-15 reps - Seated Multifidi Isometric  - 1-2 x daily - 7 x weekly - 1 sets - 10 reps - 5-10 hold - Tandem Stance  - 1 x daily - 7 x weekly - 1 sets - 2-4 reps - 30 hold - Corner Balance Feet Together With Eyes Open  - 1 x daily - 7 x weekly - 1 sets - 2-3 reps - 15-30 hold - Heel Toe Raises with Counter Support  - 1 x daily - 7 x weekly - 1-2 sets - 10 reps   ASSESSMENT:   CLINICAL IMPRESSION: Reviewed HEP to improve knowledge and techniques for trial HEP due to improvements.  Additional balance interventions provided to improve balance control.      OBJECTIVE IMPAIRMENTS: Abnormal gait, decreased activity tolerance, decreased balance, decreased endurance, decreased mobility, difficulty walking, decreased ROM, decreased strength, hypomobility, impaired perceived functional ability, impaired flexibility, improper body mechanics, and pain.    ACTIVITY LIMITATIONS: carrying, lifting, bending, standing, sleeping, stairs, transfers, and locomotion level   PARTICIPATION LIMITATIONS: interpersonal relationship, community activity, and occupation   PERSONAL FACTORS:  DM, GERD, Gout, peripherial neuropathy, history of prostate cancer, WPW syrndrome  are also affecting patient's functional outcome.    REHAB POTENTIAL: Good   CLINICAL DECISION MAKING: Stable/uncomplicated   EVALUATION COMPLEXITY: Low     GOALS: Goals reviewed with patient? Yes   SHORT TERM GOALS: (target date for Short term goals are 3 weeks 12/31/2021)   1. Patient will demonstrate  independent use of home exercise program to maintain  progress from in clinic treatments.   Goal status: Met   LONG TERM GOALS: (target dates for all long term goals are 10 weeks  02/18/2022 )   1. Patient will demonstrate/report pain at worst less than or equal to 2/10 to facilitate minimal limitation in daily activity secondary to pain symptoms.   Goal status: on going 12/31/2021   2. Patient will demonstrate independent use of home exercise program to facilitate ability to maintain/progress functional gains from skilled physical therapy services.   Goal status:on going 12/31/2021   3. Patient will demonstrate FOTO outcome > or = 62 % to indicate reduced disability due to condition.   Goal status: on going 12/31/2021   4. Patient will demonstrate lumbar extension 100 % WFL s symptoms to facilitate upright standing, walking posture at PLOF s limitation.   Goal status: on going 12/31/2021   5.  Patient will demonstrate bilateral SLS > or = 10 seconds to facilitate stability in ambulation and daily activity.  Goal status: on going 12/31/2021   6.  Patient will demonstrate bilateral hip strength 5/5 throughout to facilitate usual standing/walking at PLOF.  Goal status: on going 12/31/2021     PLAN:   PT FREQUENCY: 1-2x/week   PT DURATION: 10 weeks   PLANNED INTERVENTIONS: Therapeutic exercises, Therapeutic activity, Neuro Muscular re-education, Balance training, Gait training, Patient/Family education, Joint mobilization, Stair training, DME instructions, Dry Needling, Electrical stimulation, Cryotherapy, vasopneumatic device, Moist heat, Taping, Traction Ultrasound, Ionotophoresis 54m/ml Dexamethasone, and Manual therapy.  All included unless contraindicated   PLAN FOR NEXT SESSION: Trial HEP.  Recert if return.   MScot Jun PT, DPT, OCS, ATC 01/21/22  2:49 PM    PHYSICAL THERAPY DISCHARGE SUMMARY  Visits from Start of Care: 7  Current functional level related to  goals / functional outcomes: See note   Remaining deficits: See note   Education / Equipment: HEP  Patient goals were partially met. Patient is being discharged due to not returning since the last visit.  MScot Jun PT, DPT, OCS, ATC 02/27/22  8:37 AM

## 2022-01-24 NOTE — Telephone Encounter (Addendum)
Patient called and said that they told him they didn't receive the fax and asked if we could please send again. They need it before the end of the year. He asked for a call on this as well. He said something about almost being out of [place] as well. [phone]

## 2022-01-30 NOTE — Telephone Encounter (Signed)
Application has been printed out from the media tab and faxed again.

## 2022-02-04 ENCOUNTER — Telehealth: Payer: Self-pay | Admitting: Internal Medicine

## 2022-02-04 DIAGNOSIS — E118 Type 2 diabetes mellitus with unspecified complications: Secondary | ICD-10-CM

## 2022-02-04 MED ORDER — FARXIGA 10 MG PO TABS: 10.0000 mg | ORAL_TABLET | Freq: Every day | ORAL | 1 refills | Status: DC

## 2022-02-04 NOTE — Telephone Encounter (Signed)
Caller & Relationship to patient: Self  Call back number: [phone]   Date of last office visit: 10.3.23  Date of next office visit: 4.3.23  Medication(s) to be refilled:  FARXIGA 10 MG TABS tablet   Preferred Pharmacy:   [facility]   Phone: [phone]  Fax: [phone]

## 2022-02-06 ENCOUNTER — Telehealth: Payer: Self-pay | Admitting: Internal Medicine

## 2022-02-06 MED ORDER — PEN NEEDLES 32G X 5 MM MISC: 2 refills | Status: DC

## 2022-02-06 NOTE — Telephone Encounter (Signed)
Patient called and said that he still has his insulin glargine, 2 Unit Dial, (TOUJEO MAX SOLOSTAR) 300 UNIT/ML Solostar Pen but he needs needles. Please advise

## 2022-02-10 NOTE — Telephone Encounter (Signed)
Pt stated that Patrick Kidd comes from [employer]. However he filled out [employer] form for [employer].   I have printed out a new [employer] form for him to pick up to complete. I recommended that he discuss financials with company directly as he was auto-enrolled into the 2023 year per note from [place] on 05/21/21. Pt expressed understanding.  Forms has been placed up front for pt pick up.

## 2022-02-10 NOTE — Telephone Encounter (Deleted)
Please call the pt back at [phone] with an update.

## 2022-02-10 NOTE — Telephone Encounter (Addendum)
LVM to discuss [place] patient assistance.  Forms was completed and faxed on 12/19/21 and 01/30/22. We have not received a determination from [employer] to date. See 12/19/21 phone note.   Has he received any correspondence with determination?

## 2022-02-10 NOTE — Telephone Encounter (Signed)
Patient received message that he was not qualified because of his income increase, but he said that his income has not increased.  He would like for you to call him back, he was on the phone when you called

## 2022-02-10 NOTE — Telephone Encounter (Addendum)
Pt has stated he does not understand why he his Pt Assistance for his [place] has stopped... He has stated the rx that was sent in $1,00 for the 90 day supply.  **  Please call the pt back at [phone] with an update.

## 2022-02-12 ENCOUNTER — Telehealth: Payer: Self-pay | Admitting: Internal Medicine

## 2022-02-12 DIAGNOSIS — R053 Chronic cough: Secondary | ICD-10-CM

## 2022-02-12 DIAGNOSIS — K219 Gastro-esophageal reflux disease without esophagitis: Secondary | ICD-10-CM

## 2022-02-12 MED ORDER — PANTOPRAZOLE SODIUM 40 MG PO TBEC: DELAYED_RELEASE_TABLET | ORAL | 0 refills | Status: DC

## 2022-02-12 NOTE — Telephone Encounter (Signed)
PT visits today and fills out forms. PT also had me take a copy of their proof of income. All documents have been placed back in Dr.Jones' mailbox.

## 2022-02-12 NOTE — Telephone Encounter (Signed)
Caller & Relationship to patient: PT  Call back number: [phone]  Date of last office visit: 11/05/21  Date of next office visit: 05/07/2022  Medication(s) to be refilled:  pantoprazole (PROTONIX) 40 MG tablet   Preferred Pharmacy:   [id number] [place] [place]

## 2022-02-14 NOTE — Telephone Encounter (Signed)
Forms have been completed and faxed to [employer].

## 2022-02-20 ENCOUNTER — Other Ambulatory Visit: Payer: Self-pay

## 2022-02-20 ENCOUNTER — Telehealth: Payer: Self-pay

## 2022-02-20 DIAGNOSIS — K7030 Alcoholic cirrhosis of liver without ascites: Secondary | ICD-10-CM

## 2022-02-20 DIAGNOSIS — N2889 Other specified disorders of kidney and ureter: Secondary | ICD-10-CM

## 2022-02-20 DIAGNOSIS — K7689 Other specified diseases of liver: Secondary | ICD-10-CM

## 2022-02-20 NOTE — Telephone Encounter (Signed)
Received personal reminder in Epic to repeat US Abd [place] scheduled for 02/25/2022 at 8:00 AM at [facility]:Pt to arrive at 7:30 AM   Nothing to eat or drink past midnight. Left message for pt to call back

## 2022-02-20 NOTE — Telephone Encounter (Signed)
Message Received: Today  Personal reminder Gillermina Hu, RN  Gillermina Hu, RN Gatha Mayer, MD  Gillermina Hu, RN Let patient know the US shows stable changes - nothing new no signs of cancer which is why we check  He needs:  1) Reminder to repeat this test in 6 months 2) Office visit me non-urgent next available re: cirrhosis  Sent 08/20/2021

## 2022-02-21 NOTE — Telephone Encounter (Signed)
Unable to leave message. Pt voice mailbox full:

## 2022-02-24 NOTE — Telephone Encounter (Signed)
Left message for pt to call back. My Chart message sent to pt.  

## 2022-02-25 ENCOUNTER — Ambulatory Visit (INDEPENDENT_AMBULATORY_CARE_PROVIDER_SITE_OTHER): Payer: Medicare Other | Admitting: Orthopedic Surgery

## 2022-02-25 ENCOUNTER — Ambulatory Visit (HOSPITAL_COMMUNITY)
Admission: RE | Admit: 2022-02-25 | Discharge: 2022-02-25 | Disposition: A | Discharge status: Home / Self Care | Payer: Medicare Other | Source: Ambulatory Visit | Attending: Internal Medicine | Admitting: Internal Medicine

## 2022-02-25 DIAGNOSIS — K7689 Other specified diseases of liver: Secondary | ICD-10-CM | POA: Insufficient documentation

## 2022-02-25 DIAGNOSIS — M5416 Radiculopathy, lumbar region: Secondary | ICD-10-CM | POA: Diagnosis not present

## 2022-02-25 DIAGNOSIS — K8020 Calculus of gallbladder without cholecystitis without obstruction: Secondary | ICD-10-CM | POA: Diagnosis not present

## 2022-02-25 DIAGNOSIS — N2889 Other specified disorders of kidney and ureter: Secondary | ICD-10-CM | POA: Insufficient documentation

## 2022-02-25 DIAGNOSIS — K7030 Alcoholic cirrhosis of liver without ascites: Secondary | ICD-10-CM | POA: Insufficient documentation

## 2022-02-25 MED ORDER — OXYCODONE HCL 5 MG PO TABS: 5.0000 mg | ORAL_TABLET | ORAL | 0 refills | Status: AC | PRN

## 2022-02-25 MED ORDER — METHYLPREDNISOLONE 4 MG PO TBPK: ORAL_TABLET | ORAL | 0 refills | Status: DC

## 2022-02-25 NOTE — Telephone Encounter (Signed)
Patient returned call

## 2022-02-25 NOTE — Telephone Encounter (Signed)
Left message for pt to call back

## 2022-02-25 NOTE — Progress Notes (Signed)
Orthopedic Spine Surgery Office Note  Assessment: Patient is a 77 y.o. male with right buttock and posterior thigh pain. MRI shows multiple levels of stenosis   Plan: -Explained that initially conservative treatment is tried as a significant number of patients may experience relief with these treatment modalities. Discussed that the conservative treatments include:  -activity modification  -physical therapy  -over the counter pain medications  -medrol dosepak  -lumbar steroid injections -Patient has tried activity modification, Advil, gabapentin, steroid injection, PT -He was going on a trip and his pain has gotten worse, so he was interested in short-term relief.  Prescribed short course of oxycodone and Medrol Dosepak to get him through his trip -Discussed decompression as a potential treatment option that we can cover further when he gets back.  Otherwise, he can try some of the conservative treatments he is already tried or try pain management -Patient should return to office in 4 weeks, x-rays at next visit: AP/lateral/flex/ex lumbar   Patient expressed understanding of the plan and all questions were answered to the patient's satisfaction.   ___________________________________________________________________________  History: Patient is a 77 y.o. male who has been previously seen in the office for lumbar spine.  Patient reports right buttock and posterior thigh pain.  He has no symptoms on the left side.  His pain has gotten worse since the last time I saw him.  He feels that a right transforaminal injection at L4 and L5 provided him with significant relief for a couple of weeks.  It did wear off and then his pain returned.  Is been gradually getting worse with time.  For the last couple days, it has acutely worsened.  He is concerned because he is going on a cruise and is looking for something to help him get through the cruise.  He states his pain is worse if he is standing for more  than 5 minutes or walks more than a couple minutes.  If he sits down or leans forward his pain does get better. Not having any back pain.  Has chronic neuropathy in paresthesias/numbness in his feet bilaterally.  No recent changes in his numbness or paresthesias  Previous treatments: activity modification, Advil, gabapentin, steroid injection, PT  Physical Exam:  General: no acute distress, appears stated age Neurologic: alert, answering questions appropriately, following commands Respiratory: unlabored breathing on room air, symmetric chest rise Psychiatric: appropriate affect, normal cadence to speech   MSK (spine):  -Strength exam      Left  Right EHL    3/5  3/5 TA    5/5  5/5 GSC    5/5  5/5 Knee extension  5/5  5/5 Hip flexion   5/5  5/5  -Sensory exam    Sensation intact to light touch in L3-S1 nerve distributions of bilateral lower extremities (decreased sensation distal to midshaft tibia bilaterally - almost completely numb)   -Achilles DTR: 0/4 on the left, 0/4 on the right -Patellar tendon DTR: 0/4 on the left, 0/4 on the right  -Straight leg raise: negative -Contralateral straight leg raise: negative -Clonus: no beats bilaterally  Imaging: XR of the lumbar spine from 11/20/2021 was previously independently reviewed and interpreted, showing disc height loss at multiple levels in the lumbar spine. Anterior osteophyte formation most prominently at L3/4 and L4/5. No fracture or dislocation. No evidence of instability on flexion/extension.    MRI of the lumbar spine from 04/09/2021 was previously independently reviewed and interpreted, showing DDD multiple levels. Lateral recess and central stenosis  at L3/4. Lateral recess stenosis at L2/3, L4/5 and L5/S1.    Patient name: Patrick Kidd Patient MRN: [id number] Date of visit: 02/25/22

## 2022-02-25 NOTE — Telephone Encounter (Signed)
Pt was contacted. Pt stated that he did have the Ultrasound  this AM at 8:00 and that he followed this instructions on his  my chart:

## 2022-02-26 ENCOUNTER — Telehealth: Payer: Self-pay | Admitting: Internal Medicine

## 2022-02-26 NOTE — Telephone Encounter (Signed)
PT calls today in regards to potential RX for sea sickness patches. PT could not specify exactly what brand or type of patch but was recommended by a friend to reach out to PCP. I informed her that an OTC version of the medication might work out as well for them. They are requesting these for their cruise tomorrow. If this can be filled they would [facility]  [id number] [place] [phone]

## 2022-03-01 ENCOUNTER — Other Ambulatory Visit: Payer: Self-pay | Admitting: Internal Medicine

## 2022-03-01 MED ORDER — SCOPOLAMINE 1 MG/3DAYS TD PT72: 1.0000 | MEDICATED_PATCH | TRANSDERMAL | 0 refills | Status: DC

## 2022-03-05 ENCOUNTER — Other Ambulatory Visit: Payer: Self-pay

## 2022-03-05 DIAGNOSIS — K7689 Other specified diseases of liver: Secondary | ICD-10-CM

## 2022-03-05 DIAGNOSIS — N2889 Other specified disorders of kidney and ureter: Secondary | ICD-10-CM

## 2022-03-05 DIAGNOSIS — K7030 Alcoholic cirrhosis of liver without ascites: Secondary | ICD-10-CM

## 2022-03-25 ENCOUNTER — Telehealth: Payer: Self-pay

## 2022-03-25 DIAGNOSIS — M48061 Spinal stenosis, lumbar region without neurogenic claudication: Secondary | ICD-10-CM

## 2022-03-25 NOTE — Telephone Encounter (Addendum)
Pt has called and stated he is needing a referral sent in to Dr. Trenton Gammon the [place] [facility].  Reason for referral is due to pt wanting a 2nd opinion about his back issues.  Pt asked that it be sent in as soon as possible. I informed pt Dr. Ronnald Ramp is out this week. Pt asked that request be sent to another provider in his absence.

## 2022-03-25 NOTE — Telephone Encounter (Signed)
Ok this is done 

## 2022-03-25 NOTE — Addendum Note (Signed)
Addended by: Biagio Borg on: 03/25/2022 04:46 PM   Modules accepted: Orders

## 2022-03-26 NOTE — Telephone Encounter (Signed)
Pt has been informed that referral was entered.

## 2022-04-02 DIAGNOSIS — M5416 Radiculopathy, lumbar region: Secondary | ICD-10-CM | POA: Diagnosis not present

## 2022-04-02 DIAGNOSIS — Z6830 Body mass index (BMI) 30.0-30.9, adult: Secondary | ICD-10-CM | POA: Diagnosis not present

## 2022-04-28 DIAGNOSIS — M5416 Radiculopathy, lumbar region: Secondary | ICD-10-CM | POA: Diagnosis not present

## 2022-04-28 DIAGNOSIS — M5116 Intervertebral disc disorders with radiculopathy, lumbar region: Secondary | ICD-10-CM | POA: Diagnosis not present

## 2022-04-28 DIAGNOSIS — M48062 Spinal stenosis, lumbar region with neurogenic claudication: Secondary | ICD-10-CM | POA: Diagnosis not present

## 2022-04-28 DIAGNOSIS — M4726 Other spondylosis with radiculopathy, lumbar region: Secondary | ICD-10-CM | POA: Diagnosis not present

## 2022-04-28 HISTORY — PX: BACK SURGERY: SHX140

## 2022-05-01 ENCOUNTER — Ambulatory Visit: Payer: Medicare Other | Admitting: Internal Medicine

## 2022-05-05 DIAGNOSIS — K819 Cholecystitis, unspecified: Secondary | ICD-10-CM

## 2022-05-05 HISTORY — DX: Cholecystitis, unspecified: K81.9

## 2022-05-07 ENCOUNTER — Encounter: Payer: Self-pay | Admitting: Internal Medicine

## 2022-05-07 ENCOUNTER — Ambulatory Visit (INDEPENDENT_AMBULATORY_CARE_PROVIDER_SITE_OTHER): Payer: Medicare Other | Admitting: Internal Medicine

## 2022-05-07 VITALS — BP 122/70 | HR 93 | Temp 98.3°F | Ht 74.0 in

## 2022-05-07 DIAGNOSIS — I10 Essential (primary) hypertension: Secondary | ICD-10-CM

## 2022-05-07 DIAGNOSIS — K219 Gastro-esophageal reflux disease without esophagitis: Secondary | ICD-10-CM | POA: Diagnosis not present

## 2022-05-07 DIAGNOSIS — N1831 Chronic kidney disease, stage 3a: Secondary | ICD-10-CM

## 2022-05-07 DIAGNOSIS — E785 Hyperlipidemia, unspecified: Secondary | ICD-10-CM | POA: Diagnosis not present

## 2022-05-07 DIAGNOSIS — E119 Type 2 diabetes mellitus without complications: Secondary | ICD-10-CM

## 2022-05-07 DIAGNOSIS — I480 Paroxysmal atrial fibrillation: Secondary | ICD-10-CM

## 2022-05-07 DIAGNOSIS — Z794 Long term (current) use of insulin: Secondary | ICD-10-CM | POA: Diagnosis not present

## 2022-05-07 DIAGNOSIS — K7030 Alcoholic cirrhosis of liver without ascites: Secondary | ICD-10-CM

## 2022-05-07 LAB — BASIC METABOLIC PANEL
BUN: 22 mg/dL (ref 6–23)
CO2: 31 mEq/L (ref 19–32)
Calcium: 9.4 mg/dL (ref 8.4–10.5)
Chloride: 97 mEq/L (ref 96–112)
Creatinine, Ser: 1.07 mg/dL (ref 0.40–1.50)
GFR: 66.92 mL/min (ref >60.00)
Glucose, Bld: 292 mg/dL — ABNORMAL HIGH (ref 70–99)
Potassium: 4.5 mEq/L (ref 3.5–5.1)
Sodium: 133 mEq/L — ABNORMAL LOW (ref 135–145)

## 2022-05-07 LAB — CBC WITH DIFFERENTIAL/PLATELET
Basophils Absolute: 0 10*3/uL (ref 0.0–0.1)
Basophils Relative: 0.2 % (ref 0.0–3.0)
Eosinophils Absolute: 0.1 10*3/uL (ref 0.0–0.7)
Eosinophils Relative: 0.5 % (ref 0.0–5.0)
HCT: 43.4 % (ref 39.0–52.0)
Hemoglobin: 15.3 g/dL (ref 13.0–17.0)
Lymphocytes Relative: 40.5 % (ref 12.0–46.0)
Lymphs Abs: 4.1 10*3/uL — ABNORMAL HIGH (ref 0.7–4.0)
MCHC: 35.3 g/dL (ref 30.0–36.0)
MCV: 96.4 fl (ref 78.0–100.0)
Monocytes Absolute: 0.8 10*3/uL (ref 0.1–1.0)
Monocytes Relative: 7.4 % (ref 3.0–12.0)
Neutro Abs: 5.2 10*3/uL (ref 1.4–7.7)
Neutrophils Relative %: 51.4 % (ref 43.0–77.0)
Platelets: 185 10*3/uL (ref 150.0–400.0)
RBC: 4.5 Mil/uL (ref 4.22–5.81)
RDW: 12.7 % (ref 11.5–15.5)
WBC: 10.2 10*3/uL (ref 4.0–10.5)

## 2022-05-07 LAB — HEPATIC FUNCTION PANEL
ALT: 27 U/L (ref 0–53)
AST: 18 U/L (ref 0–37)
Albumin: 4 g/dL (ref 3.5–5.2)
Alkaline Phosphatase: 104 U/L (ref 39–117)
Bilirubin, Direct: 0.7 mg/dL — ABNORMAL HIGH (ref 0.0–0.3)
Total Bilirubin: 2.2 mg/dL — ABNORMAL HIGH (ref 0.2–1.2)
Total Protein: 6.8 g/dL (ref 6.0–8.3)

## 2022-05-07 LAB — HEMOGLOBIN A1C: Hgb A1c MFr Bld: 7.2 % — ABNORMAL HIGH (ref 4.6–6.5)

## 2022-05-07 LAB — LIPID PANEL
Cholesterol: 142 mg/dL (ref 0–200)
HDL: 53 mg/dL (ref >39.00)
LDL Cholesterol: 71 mg/dL (ref 0–99)
NonHDL: 88.58
Total CHOL/HDL Ratio: 3
Triglycerides: 88 mg/dL (ref 0.0–149.0)
VLDL: 17.6 mg/dL (ref 0.0–40.0)

## 2022-05-07 LAB — TSH: TSH: 0.89 u[IU]/mL (ref 0.35–5.50)

## 2022-05-07 MED ORDER — FREESTYLE LIBRE 3 READER DEVI: 1.0000 | Freq: Every day | 3 refills | Status: DC

## 2022-05-07 MED ORDER — FREESTYLE LIBRE 3 SENSOR MISC: 1.0000 | Freq: Every day | 5 refills | Status: DC

## 2022-05-07 NOTE — Progress Notes (Signed)
Subjective:  Patient ID: Patrick Kidd, male    DOB: 1945/12/16  Age: 77 y.o. MRN: [id number]  CC: Hyperlipidemia, Diabetes, Gastroesophageal Reflux, and Hypertension   HPI DIMITRIOUS TUNIS presents for f/up ---  He is active and denies chest pain, shortness of breath, diaphoresis, or edema.  Outpatient Medications Prior to Visit  Medication Sig Dispense Refill   gabapentin (NEURONTIN) 300 MG capsule Take 1 capsule (300 mg total) by mouth 3 (three) times daily. 270 capsule 1   Lancets (ACCU-CHEK MULTICLIX) lancets Use to check sugar daily. DX: E11.8 100 each 12   metFORMIN (GLUCOPHAGE) 500 MG tablet Take 1 tablet (500 mg total) by mouth 2 (two) times daily with a meal. 180 tablet 1   pantoprazole (PROTONIX) 40 MG tablet TAKE 1 TABLET 30 MINUTES BEFORE MEALS (BREAKFAST AND SUPPER) TWICE A DAY 180 tablet 0   blood glucose meter kit and supplies KIT Use to test blood sugar once daily. DX E11.8 1 each 0   glucose blood (COOL BLOOD GLUCOSE TEST STRIPS) test strip Use to check blood sugar daily. DX: E11.8 300 each 3   insulin glargine, 2 Unit Dial, (TOUJEO MAX SOLOSTAR) 300 UNIT/ML Solostar Pen Inject 50 Units into the skin daily. Via SANOFI pt assistance 15 mL 1   cyclobenzaprine (FLEXERIL) 10 MG tablet Take 1 tablet (10 mg total) by mouth 3 (three) times daily as needed for muscle spasms. (Patient not taking: Reported on 12/02/2021) 90 tablet 3   methylPREDNISolone (MEDROL DOSEPAK) 4 MG TBPK tablet Take as instructed on the box (Patient not taking: Reported on 05/07/2022) 21 tablet 0   mirabegron ER (MYRBETRIQ) 50 MG TB24 tablet Take 1 tablet (50 mg total) by mouth daily. (Patient not taking: Reported on 12/02/2021) 90 tablet 1   scopolamine (TRANSDERM-SCOP) 1 MG/3DAYS Place 1 patch (1.5 mg total) onto the skin every 3 (three) days. (Patient not taking: Reported on 05/07/2022) 10 patch 0   tadalafil (CIALIS) 20 MG tablet Take 1 tablet (20 mg total) by mouth daily as needed for erectile  dysfunction. (Patient not taking: Reported on 05/07/2022) 6 tablet 3   vitamin B-12 (CYANOCOBALAMIN) 1000 MCG tablet Take 1,000 mcg by mouth daily. (Patient not taking: Reported on 05/07/2022)     Zinc Sulfate (ZINC 15 PO) Take by mouth. (Patient not taking: Reported on 05/07/2022)     Colchicine (MITIGARE) 0.6 MG CAPS Take 1 tablet by mouth daily. (Patient not taking: Reported on 05/07/2022) 90 capsule 0   FARXIGA 10 MG TABS tablet Take 1 tablet (10 mg total) by mouth daily before breakfast. (Patient not taking: Reported on 05/07/2022) 90 tablet 1   Insulin Pen Needle (PEN NEEDLES) 32G X 5 MM MISC Use to administer insulin. Dx E11.9 Dispense based on insurance preference. (Patient not taking: Reported on 05/07/2022) 100 each 2   No facility-administered medications prior to visit.    ROS Review of Systems  Constitutional: Negative.  Negative for diaphoresis and fatigue.  HENT: Negative.    Eyes: Negative.   Respiratory:  Negative for cough, chest tightness, shortness of breath and wheezing.   Cardiovascular:  Negative for chest pain, palpitations and leg swelling.  Gastrointestinal:  Negative for abdominal pain, constipation, diarrhea, nausea and vomiting.  Endocrine: Negative.   Genitourinary: Negative.  Negative for difficulty urinating.  Musculoskeletal:  Positive for back pain. Negative for arthralgias and myalgias.  Skin: Negative.   Neurological:  Negative for dizziness, weakness and headaches.  Hematological:  Negative for adenopathy. Does not bruise/bleed  easily.  Psychiatric/Behavioral: Negative.      Objective:  BP 122/70 (BP Location: Left Arm, Patient Position: Sitting, Cuff Size: Normal)   Pulse 93   Temp 98.3 F (36.8 C) (Oral)   Ht 6\' 2"  (1.88 m)   SpO2 96%   BMI 30.17 kg/m   BP Readings from Last 3 Encounters:  05/07/22 122/70  12/11/21 (!) 159/79  11/27/21 (!) 170/75    Wt Readings from Last 3 Encounters:  11/27/21 235 lb (106.6 kg)  11/20/21 235 lb (106.6 kg)   11/15/21 235 lb 6.4 oz (106.8 kg)    Physical Exam Vitals reviewed.  Constitutional:      Appearance: He is not ill-appearing.  HENT:     Nose: Nose normal.     Mouth/Throat:     Mouth: Mucous membranes are moist.  Eyes:     General: No scleral icterus.    Conjunctiva/sclera: Conjunctivae normal.  Cardiovascular:     Rate and Rhythm: Normal rate and regular rhythm.     Heart sounds: No murmur heard.    No friction rub. No gallop.  Pulmonary:     Effort: Pulmonary effort is normal.     Breath sounds: No stridor. No wheezing, rhonchi or rales.  Abdominal:     General: Abdomen is protuberant. Bowel sounds are normal. There is no distension.     Palpations: Abdomen is soft. There is no fluid wave, hepatomegaly, splenomegaly or mass.     Tenderness: There is no abdominal tenderness.  Musculoskeletal:        General: Normal range of motion.     Cervical back: Neck supple.     Right lower leg: No edema.     Left lower leg: No edema.  Lymphadenopathy:     Cervical: No cervical adenopathy.  Skin:    General: Skin is warm and dry.  Neurological:     General: No focal deficit present.     Mental Status: He is alert.  Psychiatric:        Judgment: Judgment normal.     Lab Results  Component Value Date   WBC 10.2 05/07/2022   HGB 15.3 05/07/2022   HCT 43.4 05/07/2022   PLT 185.0 05/07/2022   GLUCOSE 292 (H) 05/07/2022   CHOL 142 05/07/2022   TRIG 88.0 05/07/2022   HDL 53.00 05/07/2022   LDLCALC 71 05/07/2022   ALT 27 05/07/2022   AST 18 05/07/2022   NA 133 (L) 05/07/2022   K 4.5 05/07/2022   CL 97 05/07/2022   CREATININE 1.07 05/07/2022   BUN 22 05/07/2022   CO2 31 05/07/2022   TSH 0.89 05/07/2022   PSA 0.2 05/15/2021   INR 0.9 10/08/2021   HGBA1C 7.2 (H) 05/07/2022   MICROALBUR 12.4 (H) 11/05/2021    US Abdomen Complete  Result Date: 02/26/2022 CLINICAL DATA:  Cirrhosis.  Liver cysts.  Renal mass. EXAM: ABDOMEN ULTRASOUND COMPLETE COMPARISON:  Abdominal  ultrasound August 16, 2021 FINDINGS: Gallbladder: Mild gallbladder wall thickening measuring 3.3 mm. Cholelithiasis identified. No Murphy's sign identified. Common bile duct: Diameter: 3.1 mm Liver: Contains a 6.7 cm cyst. No follow-up imaging recommended for the cyst. No suspicious masses. Portal vein is patent on color Doppler imaging with normal direction of blood flow towards the liver. IVC: No abnormality visualized. Pancreas: Visualized portion unremarkable. Spleen: Size and appearance within normal limits. Right Kidney: Length: 13.4 cm. Echogenicity within normal limits. No mass or hydronephrosis visualized. Left Kidney: Length: 13.1 cm. Echogenicity within normal limits. No  mass or hydronephrosis visualized. Abdominal aorta: No aneurysm visualized. Other findings: None. IMPRESSION: 1. Cholelithiasis with mild gallbladder wall thickening. No Murphy's sign. No pericholecystic fluid. 2. No other significant abnormalities. Specifically, no suspicious liver masses identified. 3. No renal mass identified despite history. Electronically Signed   By: Gerome [name] III M.D.   On: 02/26/2022 06:47    Assessment & Plan:    Essential hypertension- His blood pressure is adequately well-controlled. -     Basic metabolic panel; Future -     TSH; Future -     CBC with Differential/Platelet; Future  Gastroesophageal reflux disease without esophagitis -     CBC with Differential/Platelet; Future  Hyperlipidemia LDL goal <100 - LDL goal achieved. Doing well on the statin  -     Lipid panel; Future -     Hepatic function panel; Future  Insulin-requiring or dependent type II diabetes mellitus- His blood sugar is adequately well-controlled. -     Hemoglobin A1c; Future -     FreeStyle Libre 3 Sensor; 1 Act by Does not apply route daily. Place 1 sensor on the skin every 14 days. Use to check glucose continuously  Dispense: 2 each; Refill: 5 -     FreeStyle Libre 3 Reader; 1 Act by Does not apply route daily.   Dispense: 1 each; Refill: 3 -     HM Diabetes Foot Exam -     Toujeo Max SoloStar; Inject 50 Units into the skin daily. Via SANOFI pt assistance  Dispense: 15 mL; Refill: 1 -     Pen Needles; Inject 1 Act into the skin daily. Use to administer insulin. Dx E11.9 Dispense based on insurance preference.  Dispense: 100 each; Refill: 1  Paroxysmal atrial fibrillation- He has good rate and rhythm control. -     TSH; Future  Stage 3a chronic kidney disease- His renal function is stable. -     Basic metabolic panel; Future -     Urinalysis, Routine w reflex microscopic; Future     Follow-up: Return in about 4 months (around 09/06/2022).  Sanda Linger, MD

## 2022-05-07 NOTE — Patient Instructions (Signed)

## 2022-05-08 LAB — URINALYSIS, ROUTINE W REFLEX MICROSCOPIC
Hgb urine dipstick: NEGATIVE
Leukocytes,Ua: NEGATIVE
Nitrite: NEGATIVE
Specific Gravity, Urine: 1.025 (ref 1.000–1.030)
Total Protein, Urine: 30 — AB
Urine Glucose: >=1000 — AB
Urobilinogen, UA: 4 — AB (ref 0.0–1.0)
pH: 6 (ref 5.0–8.0)

## 2022-05-08 MED ORDER — PEN NEEDLES 32G X 5 MM MISC: 1.0000 | Freq: Every day | 1 refills | Status: DC

## 2022-05-08 MED ORDER — TOUJEO MAX SOLOSTAR 300 UNIT/ML ~~LOC~~ SOPN
50.0000 [IU] | PEN_INJECTOR | Freq: Every day | SUBCUTANEOUS | 1 refills | Status: DC
Start: 2022-05-08 — End: 2022-12-10

## 2022-05-28 ENCOUNTER — Telehealth: Payer: Self-pay | Admitting: Internal Medicine

## 2022-05-28 NOTE — Telephone Encounter (Signed)
PT is throwing up and has a high fever and wants to know should she come in. Requesting a call back. Please advise.

## 2022-05-28 NOTE — Telephone Encounter (Signed)
Pt stated that he is much better today. Pt stated that he threw up 4 times last night but none today and is feeling better. Pt notified to contact our office if symptoms reoccur.  Pt verbalized understanding with all questions answered.

## 2022-05-30 ENCOUNTER — Ambulatory Visit (INDEPENDENT_AMBULATORY_CARE_PROVIDER_SITE_OTHER): Payer: Medicare Other

## 2022-05-30 ENCOUNTER — Emergency Department (HOSPITAL_COMMUNITY): Payer: Medicare Other

## 2022-05-30 ENCOUNTER — Encounter (HOSPITAL_COMMUNITY): Payer: Self-pay | Admitting: *Deleted

## 2022-05-30 ENCOUNTER — Inpatient Hospital Stay (HOSPITAL_COMMUNITY)
Admission: EM | Admit: 2022-05-30 | Discharge: 2022-06-03 | DRG: 445 | Disposition: A | Discharge status: Home / Self Care | Payer: Medicare Other | Attending: Family Medicine | Admitting: Family Medicine

## 2022-05-30 ENCOUNTER — Other Ambulatory Visit: Payer: Self-pay

## 2022-05-30 ENCOUNTER — Ambulatory Visit (INDEPENDENT_AMBULATORY_CARE_PROVIDER_SITE_OTHER)
Admission: EM | Admit: 2022-05-30 | Discharge: 2022-05-30 | Disposition: A | Discharge status: Home / Self Care | Payer: Medicare Other | Source: Home / Self Care

## 2022-05-30 ENCOUNTER — Ambulatory Visit: Payer: Medicare Other | Admitting: Internal Medicine

## 2022-05-30 ENCOUNTER — Telehealth (HOSPITAL_COMMUNITY): Payer: Self-pay | Admitting: Emergency Medicine

## 2022-05-30 ENCOUNTER — Encounter (HOSPITAL_COMMUNITY): Payer: Self-pay

## 2022-05-30 DIAGNOSIS — K429 Umbilical hernia without obstruction or gangrene: Secondary | ICD-10-CM | POA: Diagnosis present

## 2022-05-30 DIAGNOSIS — Z8546 Personal history of malignant neoplasm of prostate: Secondary | ICD-10-CM

## 2022-05-30 DIAGNOSIS — N401 Enlarged prostate with lower urinary tract symptoms: Secondary | ICD-10-CM | POA: Diagnosis present

## 2022-05-30 DIAGNOSIS — R1011 Right upper quadrant pain: Secondary | ICD-10-CM | POA: Diagnosis not present

## 2022-05-30 DIAGNOSIS — R112 Nausea with vomiting, unspecified: Secondary | ICD-10-CM | POA: Diagnosis not present

## 2022-05-30 DIAGNOSIS — I10 Essential (primary) hypertension: Secondary | ICD-10-CM | POA: Diagnosis not present

## 2022-05-30 DIAGNOSIS — D890 Polyclonal hypergammaglobulinemia: Secondary | ICD-10-CM | POA: Diagnosis present

## 2022-05-30 DIAGNOSIS — E1165 Type 2 diabetes mellitus with hyperglycemia: Secondary | ICD-10-CM | POA: Diagnosis present

## 2022-05-30 DIAGNOSIS — C9110 Chronic lymphocytic leukemia of B-cell type not having achieved remission: Secondary | ICD-10-CM | POA: Diagnosis not present

## 2022-05-30 DIAGNOSIS — R509 Fever, unspecified: Secondary | ICD-10-CM | POA: Diagnosis not present

## 2022-05-30 DIAGNOSIS — R351 Nocturia: Secondary | ICD-10-CM | POA: Diagnosis present

## 2022-05-30 DIAGNOSIS — K219 Gastro-esophageal reflux disease without esophagitis: Secondary | ICD-10-CM | POA: Diagnosis present

## 2022-05-30 DIAGNOSIS — K7689 Other specified diseases of liver: Secondary | ICD-10-CM | POA: Diagnosis not present

## 2022-05-30 DIAGNOSIS — K766 Portal hypertension: Secondary | ICD-10-CM | POA: Diagnosis present

## 2022-05-30 DIAGNOSIS — Z87891 Personal history of nicotine dependence: Secondary | ICD-10-CM

## 2022-05-30 DIAGNOSIS — K5900 Constipation, unspecified: Secondary | ICD-10-CM | POA: Diagnosis not present

## 2022-05-30 DIAGNOSIS — Z8616 Personal history of COVID-19: Secondary | ICD-10-CM

## 2022-05-30 DIAGNOSIS — D696 Thrombocytopenia, unspecified: Secondary | ICD-10-CM | POA: Diagnosis present

## 2022-05-30 DIAGNOSIS — I480 Paroxysmal atrial fibrillation: Secondary | ICD-10-CM | POA: Diagnosis present

## 2022-05-30 DIAGNOSIS — Z7984 Long term (current) use of oral hypoglycemic drugs: Secondary | ICD-10-CM

## 2022-05-30 DIAGNOSIS — R1084 Generalized abdominal pain: Secondary | ICD-10-CM | POA: Diagnosis not present

## 2022-05-30 DIAGNOSIS — I456 Pre-excitation syndrome: Secondary | ICD-10-CM | POA: Diagnosis not present

## 2022-05-30 DIAGNOSIS — R161 Splenomegaly, not elsewhere classified: Secondary | ICD-10-CM | POA: Diagnosis present

## 2022-05-30 DIAGNOSIS — K828 Other specified diseases of gallbladder: Secondary | ICD-10-CM | POA: Diagnosis not present

## 2022-05-30 DIAGNOSIS — G629 Polyneuropathy, unspecified: Secondary | ICD-10-CM

## 2022-05-30 DIAGNOSIS — N1831 Chronic kidney disease, stage 3a: Secondary | ICD-10-CM | POA: Diagnosis present

## 2022-05-30 DIAGNOSIS — Z806 Family history of leukemia: Secondary | ICD-10-CM

## 2022-05-30 DIAGNOSIS — E1151 Type 2 diabetes mellitus with diabetic peripheral angiopathy without gangrene: Secondary | ICD-10-CM | POA: Diagnosis present

## 2022-05-30 DIAGNOSIS — B9620 Unspecified Escherichia coli [E. coli] as the cause of diseases classified elsewhere: Secondary | ICD-10-CM | POA: Insufficient documentation

## 2022-05-30 DIAGNOSIS — J309 Allergic rhinitis, unspecified: Secondary | ICD-10-CM | POA: Diagnosis present

## 2022-05-30 DIAGNOSIS — K7030 Alcoholic cirrhosis of liver without ascites: Secondary | ICD-10-CM | POA: Diagnosis present

## 2022-05-30 DIAGNOSIS — E1142 Type 2 diabetes mellitus with diabetic polyneuropathy: Secondary | ICD-10-CM | POA: Diagnosis present

## 2022-05-30 DIAGNOSIS — K8000 Calculus of gallbladder with acute cholecystitis without obstruction: Secondary | ICD-10-CM | POA: Diagnosis not present

## 2022-05-30 DIAGNOSIS — E669 Obesity, unspecified: Secondary | ICD-10-CM | POA: Diagnosis present

## 2022-05-30 DIAGNOSIS — E118 Type 2 diabetes mellitus with unspecified complications: Secondary | ICD-10-CM

## 2022-05-30 DIAGNOSIS — Z79899 Other long term (current) drug therapy: Secondary | ICD-10-CM

## 2022-05-30 DIAGNOSIS — E785 Hyperlipidemia, unspecified: Secondary | ICD-10-CM | POA: Diagnosis present

## 2022-05-30 DIAGNOSIS — R17 Unspecified jaundice: Secondary | ICD-10-CM

## 2022-05-30 DIAGNOSIS — M109 Gout, unspecified: Secondary | ICD-10-CM | POA: Diagnosis present

## 2022-05-30 DIAGNOSIS — R7881 Bacteremia: Secondary | ICD-10-CM | POA: Diagnosis present

## 2022-05-30 DIAGNOSIS — Z794 Long term (current) use of insulin: Secondary | ICD-10-CM | POA: Diagnosis not present

## 2022-05-30 DIAGNOSIS — Z888 Allergy status to other drugs, medicaments and biological substances status: Secondary | ICD-10-CM

## 2022-05-30 DIAGNOSIS — K8020 Calculus of gallbladder without cholecystitis without obstruction: Secondary | ICD-10-CM | POA: Diagnosis not present

## 2022-05-30 DIAGNOSIS — Z6828 Body mass index (BMI) 28.0-28.9, adult: Secondary | ICD-10-CM

## 2022-05-30 DIAGNOSIS — K7460 Unspecified cirrhosis of liver: Secondary | ICD-10-CM | POA: Diagnosis not present

## 2022-05-30 DIAGNOSIS — N200 Calculus of kidney: Secondary | ICD-10-CM | POA: Diagnosis not present

## 2022-05-30 DIAGNOSIS — R000 Tachycardia, unspecified: Secondary | ICD-10-CM | POA: Diagnosis not present

## 2022-05-30 DIAGNOSIS — R109 Unspecified abdominal pain: Secondary | ICD-10-CM | POA: Diagnosis not present

## 2022-05-30 LAB — CBC WITH DIFFERENTIAL/PLATELET
Abs Immature Granulocytes: 0.03 10*3/uL (ref 0.00–0.07)
Basophils Absolute: 0 10*3/uL (ref 0.0–0.1)
Basophils Relative: 0 %
Eosinophils Absolute: 0 10*3/uL (ref 0.0–0.5)
Eosinophils Relative: 0 %
HCT: 41.2 % (ref 39.0–52.0)
Hemoglobin: 14.3 g/dL (ref 13.0–17.0)
Immature Granulocytes: 0 %
Lymphocytes Relative: 30 %
Lymphs Abs: 2.6 10*3/uL (ref 0.7–4.0)
MCH: 33.2 pg (ref 26.0–34.0)
MCHC: 34.7 g/dL (ref 30.0–36.0)
MCV: 95.6 fL (ref 80.0–100.0)
Monocytes Absolute: 0.4 10*3/uL (ref 0.1–1.0)
Monocytes Relative: 5 %
Neutro Abs: 5.8 10*3/uL (ref 1.7–7.7)
Neutrophils Relative %: 65 %
Platelets: 81 10*3/uL — ABNORMAL LOW (ref 150–400)
RBC: 4.31 MIL/uL (ref 4.22–5.81)
RDW: 12.7 % (ref 11.5–15.5)
WBC: 8.8 10*3/uL (ref 4.0–10.5)
nRBC: 0 % (ref 0.0–0.2)

## 2022-05-30 LAB — URINALYSIS, ROUTINE W REFLEX MICROSCOPIC
Bilirubin Urine: NEGATIVE
Glucose, UA: >=500 mg/dL — AB
Ketones, ur: 80 mg/dL — AB
Leukocytes,Ua: NEGATIVE
Nitrite: NEGATIVE
Protein, ur: 100 mg/dL — AB
Specific Gravity, Urine: 1.022 (ref 1.005–1.030)
pH: 5 (ref 5.0–8.0)

## 2022-05-30 LAB — LACTIC ACID, PLASMA: Lactic Acid, Venous: 1.7 mmol/L (ref 0.5–1.9)

## 2022-05-30 LAB — COMPREHENSIVE METABOLIC PANEL
ALT: 36 U/L (ref 0–44)
AST: 32 U/L (ref 15–41)
Albumin: 3.3 g/dL — ABNORMAL LOW (ref 3.5–5.0)
Alkaline Phosphatase: 121 U/L (ref 38–126)
Anion gap: 14 (ref 5–15)
BUN: 15 mg/dL (ref 8–23)
CO2: 24 mmol/L (ref 22–32)
Calcium: 8.8 mg/dL — ABNORMAL LOW (ref 8.9–10.3)
Chloride: 94 mmol/L — ABNORMAL LOW (ref 98–111)
Creatinine, Ser: 1.05 mg/dL (ref 0.61–1.24)
GFR, Estimated: >60 mL/min (ref >60)
Glucose, Bld: 317 mg/dL — ABNORMAL HIGH (ref 70–99)
Potassium: 4.3 mmol/L (ref 3.5–5.1)
Sodium: 132 mmol/L — ABNORMAL LOW (ref 135–145)
Total Bilirubin: 5.6 mg/dL — ABNORMAL HIGH (ref 0.3–1.2)
Total Protein: 6.9 g/dL (ref 6.5–8.1)

## 2022-05-30 LAB — POCT URINALYSIS DIP (MANUAL ENTRY)
Glucose, UA: 500 mg/dL — AB
Leukocytes, UA: NEGATIVE
Nitrite, UA: NEGATIVE
Protein Ur, POC: 100 mg/dL — AB
Spec Grav, UA: 1.025 (ref 1.010–1.025)
Urobilinogen, UA: 1 E.U./dL
pH, UA: 5 (ref 5.0–8.0)

## 2022-05-30 LAB — PROTIME-INR
INR: 1.2 (ref 0.8–1.2)
Prothrombin Time: 14.7 seconds (ref 11.4–15.2)

## 2022-05-30 LAB — LIPASE, BLOOD: Lipase: 30 U/L (ref 11–51)

## 2022-05-30 MED ORDER — METRONIDAZOLE 500 MG/100ML IV SOLN
500.0000 mg | Freq: Two times a day (BID) | INTRAVENOUS | Status: DC
  Administered 2022-05-31 – 2022-06-03 (×7): 500 mg via INTRAVENOUS
  Filled 2022-05-30 (×7): qty 100

## 2022-05-30 MED ORDER — ALUM & MAG HYDROXIDE-SIMETH 200-200-20 MG/5ML PO SUSP
30.0000 mL | Freq: Once | ORAL | Status: AC
  Administered 2022-05-30: 30 mL via ORAL

## 2022-05-30 MED ORDER — SODIUM CHLORIDE 0.9 % IV BOLUS
1000.0000 mL | Freq: Once | INTRAVENOUS | Status: AC
  Administered 2022-05-30: 1000 mL via INTRAVENOUS

## 2022-05-30 MED ORDER — LACTATED RINGERS IV SOLN: INTRAVENOUS | Status: AC

## 2022-05-30 MED ORDER — ONDANSETRON HCL 4 MG/2ML IJ SOLN
4.0000 mg | Freq: Once | INTRAMUSCULAR | Status: AC
  Administered 2022-05-30: 4 mg via INTRAVENOUS
  Filled 2022-05-30: qty 2

## 2022-05-30 MED ORDER — FENTANYL CITRATE PF 50 MCG/ML IJ SOSY
50.0000 ug | PREFILLED_SYRINGE | Freq: Once | INTRAMUSCULAR | Status: AC
  Administered 2022-05-30: 50 ug via INTRAVENOUS
  Filled 2022-05-30: qty 1

## 2022-05-30 MED ORDER — ALUM & MAG HYDROXIDE-SIMETH 200-200-20 MG/5ML PO SUSP
ORAL | Status: AC
  Filled 2022-05-30: qty 30

## 2022-05-30 MED ORDER — IOHEXOL 350 MG/ML SOLN
75.0000 mL | Freq: Once | INTRAVENOUS | Status: AC | PRN
  Administered 2022-05-30: 75 mL via INTRAVENOUS

## 2022-05-30 MED ORDER — PIPERACILLIN-TAZOBACTAM 3.375 G IVPB 30 MIN
3.3750 g | Freq: Once | INTRAVENOUS | Status: AC
  Administered 2022-05-30: 3.375 g via INTRAVENOUS
  Filled 2022-05-30: qty 50

## 2022-05-30 MED ORDER — ALUM & MAG HYDROXIDE-SIMETH 400-400-40 MG/5ML PO SUSP: 10.0000 mL | Freq: Four times a day (QID) | ORAL | 0 refills | Status: DC | PRN

## 2022-05-30 MED ORDER — SODIUM CHLORIDE 0.9 % IV SOLN
2.0000 g | INTRAVENOUS | Status: DC
  Administered 2022-05-31 – 2022-06-03 (×4): 2 g via INTRAVENOUS
  Filled 2022-05-30 (×5): qty 20

## 2022-05-30 NOTE — Assessment & Plan Note (Signed)
Add SSI, continue with lantus(on tuojeo at home)

## 2022-05-30 NOTE — H&P (Signed)
History and Physical    Patrick Kidd:[id number] DOB: 1945-06-23 DOA: 05/30/2022  DOS: the patient was seen and examined on 05/30/2022  PCP: Etta Grandchild, MD   Patient coming from: Home  I have personally briefly reviewed patient's old medical records in [facility]  CC: abd pain, N/V HPI: 77 year old male history of alcoholic cirrhosis, CLL, diabetes, hypertension, peripheral neuropathy, morbid obesity, history of Wolff-Parkinson-White syndrome, prior history of prostate cancer, presents to the ER today with 5-day history of feeling poorly.  He started having vomiting x 1 on Tuesday.  Began having abdominal pain the same day.  Has noted dark-colored urine for the last 3 to 4 days.  He had 4 loose stools today.  He had a fever on Monday night.  He has had low-grade temperatures the rest of the week.  Was seen in urgent care today.  Had labs drawn.  Bilirubin came back elevated at 5.6.  It had been 2.2 about 1 month ago.  He was referred to the ER for evaluation.  On arrival temp 98.3 but increased to 100.8 heart rate 98 blood pressure 198/83.  Heart rate is increased to 122.  White count 8.8, hemoglobin 14.3, platelets 81 Sodium 132, potassium 4.3, chloride 94, bicarb 24, BUN of 15, creatinine  1.05, glucose of 317, total protein 6.9, albumin 3.3, AST 34, ALT 36, alk phos 121, total bili 5.6  INR 1.2  CT abdomen pelvis demonstrated high layer density material in the dependent portion of the gallbladder.  Mild wall thickening.  Mild fat stranding adjacent to the gallbladder.  No dilatation of the bile ducts.  Liver is cirrhotic.  EDP discussed the case Dr. Sophronia Simas with surgery.  Dr. Freida Busman recommended admission to the hospital to the hospitalist.  And to order a MRCP if the patient's LFTs continue to rise.  Surgery will see the patient in the morning.   ED Course: CT abd shows sludge in GB. WBC 8.8, T. Bili 5.6  Review of Systems:  Review of Systems  Constitutional:   Positive for chills and fever.  HENT: Negative.    Eyes: Negative.   Respiratory: Negative.    Cardiovascular: Negative.   Gastrointestinal:  Positive for abdominal pain, nausea and vomiting.  Genitourinary:        Dark colored urine for 3 days  Musculoskeletal: Negative.   Skin: Negative.   Neurological: Negative.   Endo/Heme/Allergies: Negative.   Psychiatric/Behavioral: Negative.    All other systems reviewed and are negative.   Past Medical History:  Diagnosis Date   Acrophobia    Alcoholic cirrhosis (HCC) 10/04/2015   Anemia    Arthritis    foot by big toe   BPH associated with nocturia    Cataract    removed both eyes   Chronic cough    PMH of   CLL (chronic lymphocytic leukemia) (HCC)    COVID-19 12/2018   Diabetes mellitus without complication (HCC)    Diverticulosis 07/03/2010   Colonoscopy.    Duodenal ulcer 2017   Fallen arches    Bilateral   GERD (gastroesophageal reflux disease)    Gout    Granuloma annulare    Hx of adenomatous colonic polyps multiple   Hydrocele 2011   Large septated right hydrocele   Liver cyst    Liver lesion    Nonspecific elevation of levels of transaminase or lactic acid dehydrogenase (LDH)    Obesity    Peripheral neuropathy    Plantar fasciitis  PMH of   Portal hypertension (HCC) 2017   Prostate cancer (HCC)    Right shoulder pain 11/2017   Sleep apnea    no cpap, patient denies   Thiamine deficiency    Wears reading eyeglasses    WPW (Wolff-Parkinson-White syndrome) 02/05/2019    Past Surgical History:  Procedure Laterality Date   BACK SURGERY  04/28/2022   CATARACT EXTRACTION, BILATERAL  12/2011   Dr Nile Riggs   COLONOSCOPY  2017   COLONOSCOPY W/ POLYPECTOMY  07/03/2010   2 adenomas, diverticulosis on right. Dr Leone Payor   CYSTOSCOPY N/A 03/15/2018   Procedure: Derinda Late;  Surgeon: Crista Elliot, MD;  Location: [facility];  Service: Urology;  Laterality: N/A;  NO SEEDS FOUND IN  BLADDER   ESOPHAGOGASTRODUODENOSCOPY (EGD) WITH PROPOFOL N/A 01/08/2016   Procedure: ESOPHAGOGASTRODUODENOSCOPY (EGD) WITH PROPOFOL;  Surgeon: Meryl Dare, MD;  Location: [facility];  Service: Endoscopy;  Laterality: N/A;   FLEXIBLE SIGMOIDOSCOPY  2000   RADIOACTIVE SEED IMPLANT N/A 03/15/2018   Procedure: RADIOACTIVE SEED IMPLANT/BRACHYTHERAPY IMPLANT;  Surgeon: Crista Elliot, MD;  Location: [facility];  Service: Urology;  Laterality: N/A;   73 SEEDS IMPLANTED   SIGMOIDOSCOPY     SPACE OAR INSTILLATION N/A 03/15/2018   Procedure: SPACE OAR INSTILLATION;  Surgeon: Crista Elliot, MD;  Location: [facility];  Service: Urology;  Laterality: N/A;   WISDOM TOOTH EXTRACTION       reports that he quit smoking about 51 years ago. His smoking use included cigarettes. He has a 12.00 pack-year smoking history. He has never been exposed to tobacco smoke. He has never used smokeless tobacco. He reports that he does not currently use alcohol. He reports that he does not use drugs.  Allergies  Allergen Reactions   Rifaximin Nausea And Vomiting    Required EMS visit, although patient did not go to hospital   Beta Adrenergic Blockers     Likely WPW.  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  Use with caution.    Family History  Problem Relation Age of Onset   Lung cancer Mother        smoker   Leukemia Father        Acute myelocytic   Diabetes Neg Hx    Stroke Neg Hx    Heart disease Neg Hx    Colon cancer Neg Hx    Colon polyps Neg Hx    Esophageal cancer Neg Hx    Rectal cancer Neg Hx    Stomach cancer Neg Hx     Prior to Admission medications   Medication Sig Start Date End Date Taking? Authorizing Provider  alum & mag hydroxide-simeth (MAALOX PLUS) 400-400-40 MG/5ML suspension Take 10 mLs by mouth every 6 (six) hours as needed for indigestion. 05/30/22   Garrison, [place] N, FNP  Continuous Blood Gluc Receiver (FREESTYLE  LIBRE 3 READER) DEVI 1 Act by Does not apply route daily. 05/07/22   Etta Grandchild, MD  Continuous Blood Gluc Sensor (FREESTYLE LIBRE 3 SENSOR) MISC 1 Act by Does not apply route daily. Place 1 sensor on the skin every 14 days. Use to check glucose continuously 05/07/22   Etta Grandchild, MD  gabapentin (NEURONTIN) 300 MG capsule Take 1 capsule (300 mg total) by mouth 3 (three) times daily. 11/05/21   Etta Grandchild, MD  insulin glargine, 2 Unit Dial, (TOUJEO MAX SOLOSTAR) 300  UNIT/ML Solostar Pen Inject 50 Units into the skin daily. Via SANOFI pt assistance 05/08/22   Etta Grandchild, MD  Insulin Pen Needle (PEN NEEDLES) 32G X 5 MM MISC Inject 1 Act into the skin daily. Use to administer insulin. Dx E11.9 Dispense based on insurance preference. 05/08/22   Etta Grandchild, MD  Lancets (ACCU-CHEK MULTICLIX) lancets Use to check sugar daily. DX: E11.8 12/21/17   Etta Grandchild, MD  metFORMIN (GLUCOPHAGE) 500 MG tablet Take 1 tablet (500 mg total) by mouth 2 (two) times daily with a meal. 11/05/21   Etta Grandchild, MD  pantoprazole (PROTONIX) 40 MG tablet TAKE 1 TABLET 30 MINUTES BEFORE MEALS (BREAKFAST AND SUPPER) TWICE A DAY 02/12/22   Etta Grandchild, MD    Physical Exam: Vitals:   05/30/22 1900 05/30/22 2002 05/30/22 2024 05/30/22 2130  BP: (!) 140/100  (!) 167/89 (!) 171/76  Pulse: (!) 135  (!) 122 (!) 122  Resp: (!) 28  (!) 23 (!) 26  Temp:  98.5 F (36.9 C) (!) 100.8 F (38.2 C)   TempSrc:  Oral Oral   SpO2: 99%  99% 98%    Physical Exam Vitals and nursing note reviewed.  Constitutional:      General: He is not in acute distress.    Appearance: Normal appearance. He is obese. He is not toxic-appearing.  HENT:     Head: Normocephalic and atraumatic.     Nose: Nose normal.  Cardiovascular:     Rate and Rhythm: Regular rhythm. Tachycardia present.  Pulmonary:     Effort: Pulmonary effort is normal.     Breath sounds: Normal breath sounds.  Abdominal:     General: Abdomen is  protuberant. Bowel sounds are normal. There is no distension.     Palpations: Abdomen is soft.     Tenderness: There is no abdominal tenderness.  Musculoskeletal:     Right lower leg: No edema.     Left lower leg: No edema.  Skin:    General: Skin is warm and dry.     Capillary Refill: Capillary refill takes less than 2 seconds.  Neurological:     General: No focal deficit present.     Mental Status: He is alert and oriented to person, place, and time.      Labs on Admission: I have personally reviewed following labs and imaging studies  CBC: Recent Labs  Lab 05/30/22 1251  WBC 8.8  NEUTROABS 5.8  HGB 14.3  HCT 41.2  MCV 95.6  PLT 81*   Basic Metabolic Panel: Recent Labs  Lab 05/30/22 1251  NA 132*  K 4.3  CL 94*  CO2 24  GLUCOSE 317*  BUN 15  CREATININE 1.05  CALCIUM 8.8*   GFR: CrCl cannot be calculated (Unknown ideal weight.). Liver Function Tests: Recent Labs  Lab 05/30/22 1251  AST 32  ALT 36  ALKPHOS 121  BILITOT 5.6*  PROT 6.9  ALBUMIN 3.3*   Recent Labs  Lab 05/30/22 1251  LIPASE 30    Coagulation Profile: Recent Labs  Lab 05/30/22 1717  INR 1.2   Urine analysis:    Component Value Date/Time   COLORURINE AMBER (A) 05/30/2022 1526   APPEARANCEUR CLEAR 05/30/2022 1526   LABSPEC 1.022 05/30/2022 1526   PHURINE 5.0 05/30/2022 1526   GLUCOSEU >=500 (A) 05/30/2022 1526   GLUCOSEU >=1000 (A) 05/07/2022 1454   HGBUR MODERATE (A) 05/30/2022 1526   BILIRUBINUR NEGATIVE 05/30/2022 1526   BILIRUBINUR moderate (A)  05/30/2022 1228   BILIRUBINUR Positive 11/11/2017 1037   KETONESUR 80 (A) 05/30/2022 1526   KETONESUR >= (160) (A) 05/30/2022 1228   PROTEINUR 100 (A) 05/30/2022 1526   PROTEINUR =100 (A) 05/30/2022 1228   UROBILINOGEN 1.0 05/30/2022 1228   UROBILINOGEN 4.0 (A) 05/07/2022 1454   NITRITE NEGATIVE 05/30/2022 1526   NITRITE Negative 05/30/2022 1228   LEUKOCYTESUR NEGATIVE 05/30/2022 1526   LEUKOCYTESUR Negative 05/30/2022 1228     Radiological Exams on Admission: I have personally reviewed images CT ABDOMEN PELVIS W CONTRAST  Result Date: 05/30/2022 CLINICAL DATA:  Abdominal pain, vomiting, constipation EXAM: CT ABDOMEN AND PELVIS WITH CONTRAST TECHNIQUE: Multidetector CT imaging of the abdomen and pelvis was performed using the standard protocol following bolus administration of intravenous contrast. RADIATION DOSE REDUCTION: This exam was performed according to the departmental dose-optimization program which includes automated exposure control, adjustment of the mA and/or kV according to patient size and/or use of iterative reconstruction technique. CONTRAST:  75mL OMNIPAQUE IOHEXOL 350 MG/ML SOLN COMPARISON:  01/28/2019 FINDINGS: Lower chest: Small linear densities are seen in lower lung fields suggesting scarring or subsegmental atelectasis. Coronary artery calcifications are seen. Hepatobiliary: There is nodularity in the liver surface. There is 6.4 cm smooth marginated fluid density lesion in the right lobe suggesting cyst. There is layering high density in the dependent portion of gallbladder lumen. There is mild wall thickening in gallbladder. There is stranding in the fat adjacent to the gallbladder. There is no dilation of bile ducts. Pancreas: No focal abnormalities are seen in pancreas. Diverticulum is noted along the inner margin of second portion of duodenum. Spleen: Spleen measures 15.3 cm in maximum diameter. Adrenals/Urinary Tract: Adrenals are unremarkable. There is no hydronephrosis. There is possible 1 mm right renal calculus. Ureters are not dilated. Urinary bladder is not distended. There is mild diffuse wall thickening in the bladder. Stomach/Bowel: Stomach is not distended. Small bowel loops are not dilated. Appendix is not dilated. Scattered diverticula are seen in colon. There is no evidence of focal acute diverticulitis. Vascular/Lymphatic: Scattered arterial calcifications are seen. There are scattered  subcentimeter nodes suggesting reactive hyperplasia. Reproductive: Metallic densities are seen in prostate suggesting previous brachytherapy. Other: There is no ascites or pneumoperitoneum. Small bilateral inguinal hernias containing fat are seen. Musculoskeletal: Degenerative changes are noted in thoracic and lumbar spine with bony spurs. IMPRESSION: There is no evidence of intestinal obstruction or pneumoperitoneum. There is no hydronephrosis. Appendix is not dilated. Gallbladder is distended. There is layering of high density in the dependent portion of gallbladder suggesting presence of sludge and stones. There is mild pericholecystic stranding. Possibility of acute cholecystitis is not excluded. If clinically warranted, follow-up gallbladder sonogram may be considered. There is no dilation of bile ducts. Coronary artery disease. There is a 6.4 cm hepatic cyst. Cirrhosis of liver. Splenomegaly. There is tiny nonobstructing right renal calculus. Scattered diverticula are seen in colon without signs of focal diverticulitis. There is mild diffuse wall thickening in the urinary bladder which may be due to incomplete distention or suggest cystitis. Electronically Signed   By: Ernie Avena M.D.   On: 05/30/2022 20:43   DG Abd 2 Views  Result Date: 05/30/2022 CLINICAL DATA:  Constipation, abdominal pain. EXAM: ABDOMEN - 2 VIEW COMPARISON:  Abdominal radiographs 02/02/2019. Abdominal MRI 08/14/2020. FINDINGS: Supine and erect views (4 total) are submitted. There is a normal, nonobstructive bowel gas pattern. Colonic stool burden does not appear increased. No evidence of pneumoperitoneum, bowel wall thickening or suspicious abdominal calcification. Prostate  brachytherapy seeds are noted. There are mild degenerative changes in the spine. IMPRESSION: No evidence of acute abdominal process. Electronically Signed   By: Carey Bullocks M.D.   On: 05/30/2022 11:32    EKG: My personal interpretation of EKG shows:  sinus tachycardia    Assessment/Plan Principal Problem:   Sludge in gallbladder Active Problems:   Hyperbilirubinemia   Type 2 diabetes mellitus with complication, with long-term current use of insulin (HCC)   Essential hypertension   WPW (Wolff-Parkinson-White syndrome)   Stage 3a chronic kidney disease (HCC)   Chronic lymphocytic leukemia (HCC)   Peripheral neuropathy    Assessment and Plan: * Sludge in gallbladder Observation med/surg bed. IVF. Start Rocephin/flagyl. General surgery to see in consult.  Hyperbilirubinemia May be due to cirrhosis. No ascites on CT abd. Repeat LFTs in AM.  Chronic lymphocytic leukemia (HCC) Chronic. Follows with heme/onc. Has low platelets. SCDs for prophylaxis.  Stage 3a chronic kidney disease (HCC) Chronic.  WPW (Wolff-Parkinson-White syndrome) Avoid betablockers and non-dihydropyridine calcium channel blockers(cardizem, verpamil). Pt states he "outgrew" his WPW in his early 20-30s.   Essential hypertension Stable.  Type 2 diabetes mellitus with complication, with long-term current use of insulin (HCC) Add SSI, continue with lantus(on tuojeo at home)  Peripheral neuropathy Continue neurontin   DVT prophylaxis: SCDs Code Status: Full Code Family Communication: discussed with pt and wife [name] at bedside  Disposition Plan: return home  Consults called: surgery([place])  Admission status: Observation, Med-Surg   Carollee Herter, DO [facility] 05/30/2022, 10:55 PM

## 2022-05-30 NOTE — ED Notes (Signed)
Ultrasound at bedside

## 2022-05-30 NOTE — Telephone Encounter (Signed)
Left message for patient to call back  

## 2022-05-30 NOTE — ED Provider Notes (Addendum)
Supervised by to visit.  Patient here with abdominal pain and constipation and bloating.  Symptoms for the last few days.  Has had decreased appetite.  He did vomit once few days ago.  Maybe had a low-grade fever.  He is felt like he had a lot of gas in his abdomen has been distended.  States that his urine is been dark.  He has a history of alcoholic cirrhosis but he has not had alcohol in several years.  He was sent here by urgent care for evaluation.  His bilirubin was increased to 5.6 which is increased from his baseline.  He is diffusely tender on abdominal exam.  He is distended.  He is not having any chest pain or shortness of breath.  EKG shows sinus tachycardia.  He has a history of diabetes, ulcer, alcoholic cirrhosis, Wolff-Parkinson-White, CLL.  Differential diagnosis likely some sort of enteritis or colitis but could be gallbladder related, bowel obstruction.  Will get a CT scan abdomen pelvis.  He is already had blood work done today at urgent care.  He had no significant anemia or electrolyte abnormality or kidney injury.  As stated before his bilirubin was elevated to 5.6 but liver enzymes were normal lipase was normal.  INR was collected today and is normal as well.  Will give IV fluids, IV Zofran and IV fentanyl.  Disposition for CT scan.  I do not think there is any cardiac or pulmonary process going on.  No ischemic changes to his EKG.  Does not have any chest pain.  Please see resident note for further results, evaluation, disposition of the patient.  CT scan shows gallbladder sludging possibly cholecystitis, common bile duct appears to be normal size.  I talked with Dr. Freida Busman with general surgery and overall recommends medicine admission.  Start IV antibiotics get blood cultures and continue to trend his bilirubin.  Overall is not a great surgical candidate if this was acute cholecystitis given his cirrhosis and low platelets.  He did develop a fever while here at 100.8.  Overall we will  admit to medicine for further care.  This chart was dictated using voice recognition software.  Despite best efforts to proofread,  errors can occur which can change the documentation meaning.    Virgina Norfolk, DO 05/30/22 1831    Virgina Norfolk, DO 05/30/22 2111

## 2022-05-30 NOTE — ED Triage Notes (Signed)
Pt states on Tuesday he vomiting and had a low grade fever. He states he had back surgery x 1 month ago and is no longer on any pain meds. He complains of constipation no BM since Monday., dark urine and decreased appetite.

## 2022-05-30 NOTE — ED Provider Notes (Signed)
[facility]    CSN: [id number] Arrival date & time: 05/30/22  4098      History   Chief Complaint Chief Complaint  Patient presents with   Abdominal Pain   Constipation    HPI Patrick Kidd is a 77 y.o. male.   Patient presents to clinic for concern of abdominal pain and constipation that started on Tuesday.  He reports his last bowel movement was Monday it was loose and came out fast without straining but it was a small amount.  Overall his appetite has been decreased, his wife reports he eats yogurt and fruit throughout the day and he is eating pot roast the past 3 nights.  He did vomit once on Tuesday, vomit was yellow-green with some pot breakfast.  He also had a fever of 100 on Tuesday.  Patient reports his abdomen is distended and he has been burping a lot.  He is unsure the last time he passed gas.  Reports his urine has been dark, has been drinking oral fluids.  Denies a history of constipation.  He is diabetic, his last CBG was checked 2 weeks ago at his annual physical, last A1c was 7.2.   History of alcoholic cirrhosis, reports he has not had alcohol in many months.    The history is provided by the patient and medical records.  Abdominal Pain Associated symptoms: constipation and fever   Associated symptoms: no chest pain, no chills, no cough, no nausea, no shortness of breath, no sore throat and no vomiting   Constipation Associated symptoms: abdominal pain and fever   Associated symptoms: no nausea and no vomiting     Past Medical History:  Diagnosis Date   Acrophobia    Alcoholic cirrhosis (HCC) 10/04/2015   Anemia    Arthritis    foot by big toe   BPH associated with nocturia    Cataract    removed both eyes   Chronic cough    PMH of   CLL (chronic lymphocytic leukemia) (HCC)    COVID-19 12/2018   Diabetes mellitus without complication (HCC)    Diverticulosis 07/03/2010   Colonoscopy.    Duodenal ulcer 2017   Fallen arches     Bilateral   GERD (gastroesophageal reflux disease)    Gout    Granuloma annulare    Hx of adenomatous colonic polyps multiple   Hydrocele 2011   Large septated right hydrocele   Liver cyst    Liver lesion    Nonspecific elevation of levels of transaminase or lactic acid dehydrogenase (LDH)    Obesity    Peripheral neuropathy    Plantar fasciitis    PMH of   Portal hypertension (HCC) 2017   Prostate cancer (HCC)    Right shoulder pain 11/2017   Sleep apnea    no cpap, patient denies   Thiamine deficiency    Wears reading eyeglasses    WPW (Wolff-Parkinson-White syndrome) 02/05/2019    Patient Active Problem List   Diagnosis Date Noted   Urinary incontinence 06/25/2021   Degenerative lumbar spinal stenosis 04/30/2021   Degenerative arthritis of knee, bilateral 03/27/2021   Chronic lymphocytic leukemia (HCC) 11/08/2020   Insulin-requiring or dependent type II diabetes mellitus (HCC) 09/25/2020   Stage 3a chronic kidney disease (HCC) 03/15/2020   Thiamine deficiency    Paroxysmal atrial fibrillation (HCC) 07/19/2019   Allergic rhinitis 06/09/2019   Erectile dysfunction due to diabetes mellitus (HCC) 06/09/2019   Overweight with body mass index (BMI) of  28 to 28.9 in adult 03/17/2019   WPW (Wolff-Parkinson-White syndrome) 02/05/2019   SBP (spontaneous bacterial peritonitis) (HCC)-December 2020,     Encephalopathy, hepatic (HCC)    Degenerative disc disease, cervical 07/21/2018   Cervical radiculopathy 07/16/2018   OAB (overactive bladder) 07/06/2018   Malignant neoplasm of prostate (HCC) 01/13/2018   Hyperlipidemia LDL goal <100 11/11/2017   Essential hypertension 11/11/2017   BPH associated with nocturia 11/11/2017   Erectile dysfunction due to arterial insufficiency 04/07/2017   Peripheral vascular disease (HCC) 06/25/2016   Alcoholic cirrhosis (HCC) 10/04/2015   Type 2 diabetes mellitus with complication, with long-term current use of insulin (HCC) 08/22/2015    Hypersomnolence 03/19/2014   Peripheral neuropathy 01/11/2013   Polyclonal gammopathy 01/11/2013   GERD 10/04/2009    Past Surgical History:  Procedure Laterality Date   BACK SURGERY  04/28/2022   CATARACT EXTRACTION, BILATERAL  12/2011   Dr Nile Riggs   COLONOSCOPY  2017   COLONOSCOPY W/ POLYPECTOMY  07/03/2010   2 adenomas, diverticulosis on right. Dr Leone Payor   CYSTOSCOPY N/A 03/15/2018   Procedure: Derinda Late;  Surgeon: Crista Elliot, MD;  Location: [facility];  Service: Urology;  Laterality: N/A;  NO SEEDS FOUND IN BLADDER   ESOPHAGOGASTRODUODENOSCOPY (EGD) WITH PROPOFOL N/A 01/08/2016   Procedure: ESOPHAGOGASTRODUODENOSCOPY (EGD) WITH PROPOFOL;  Surgeon: Meryl Dare, MD;  Location: [facility];  Service: Endoscopy;  Laterality: N/A;   FLEXIBLE SIGMOIDOSCOPY  2000   RADIOACTIVE SEED IMPLANT N/A 03/15/2018   Procedure: RADIOACTIVE SEED IMPLANT/BRACHYTHERAPY IMPLANT;  Surgeon: Crista Elliot, MD;  Location: [facility];  Service: Urology;  Laterality: N/A;   73 SEEDS IMPLANTED   SIGMOIDOSCOPY     SPACE OAR INSTILLATION N/A 03/15/2018   Procedure: SPACE OAR INSTILLATION;  Surgeon: Crista Elliot, MD;  Location: [facility];  Service: Urology;  Laterality: N/A;   WISDOM TOOTH EXTRACTION         Home Medications    Prior to Admission medications   Medication Sig Start Date End Date Taking? Authorizing Provider  alum & mag hydroxide-simeth (MAALOX PLUS) 400-400-40 MG/5ML suspension Take 10 mLs by mouth every 6 (six) hours as needed for indigestion. 05/30/22  Yes Rinaldo Ratel, [place], FNP  Continuous Blood Gluc Receiver (FREESTYLE LIBRE 3 READER) DEVI 1 Act by Does not apply route daily. 05/07/22  Yes Etta Grandchild, MD  Continuous Blood Gluc Sensor (FREESTYLE LIBRE 3 SENSOR) MISC 1 Act by Does not apply route daily. Place 1 sensor on the skin every 14 days. Use to check glucose continuously 05/07/22  Yes Etta Grandchild, MD  gabapentin (NEURONTIN) 300 MG capsule Take 1 capsule (300 mg total) by mouth 3 (three) times daily. 11/05/21  Yes Etta Grandchild, MD  insulin glargine, 2 Unit Dial, (TOUJEO MAX SOLOSTAR) 300 UNIT/ML Solostar Pen Inject 50 Units into the skin daily. Via SANOFI pt assistance 05/08/22  Yes Etta Grandchild, MD  Insulin Pen Needle (PEN NEEDLES) 32G X 5 MM MISC Inject 1 Act into the skin daily. Use to administer insulin. Dx E11.9 Dispense based on insurance preference. 05/08/22  Yes Etta Grandchild, MD  Lancets (ACCU-CHEK MULTICLIX) lancets Use to check sugar daily. DX: E11.8 12/21/17  Yes Etta Grandchild, MD  metFORMIN (GLUCOPHAGE) 500 MG tablet Take 1 tablet (500 mg total) by mouth 2 (two) times daily with a meal. 11/05/21  Yes Etta Grandchild, MD  pantoprazole (PROTONIX) 40 MG tablet TAKE 1  TABLET 30 MINUTES BEFORE MEALS (BREAKFAST AND SUPPER) TWICE A DAY 02/12/22  Yes Etta Grandchild, MD    Family History Family History  Problem Relation Age of Onset   Lung cancer Mother        smoker   Leukemia Father        Acute myelocytic   Diabetes Neg Hx    Stroke Neg Hx    Heart disease Neg Hx    Colon cancer Neg Hx    Colon polyps Neg Hx    Esophageal cancer Neg Hx    Rectal cancer Neg Hx    Stomach cancer Neg Hx     Social History Social History   Tobacco Use   Smoking status: Former    Packs/day: 2.00    Years: 6.00    Additional pack years: 0.00    Total pack years: 12.00    Types: Cigarettes    Quit date: 02/04/1971    Years since quitting: 51.3    Passive exposure: Never   Smokeless tobacco: Never   Tobacco comments:    smoked age [age], up to 2 ppd  Vaping Use   Vaping Use: Never used  Substance Use Topics   Alcohol use: Not Currently    Comment: No alochol since Christmas Day 2020   Drug use: No     Allergies   Rifaximin, Beta adrenergic blockers, and Calcium channel blockers   Review of Systems Review of Systems  Constitutional:  Positive for fever. Negative for  chills.  HENT:  Negative for sore throat.   Respiratory:  Negative for cough and shortness of breath.   Cardiovascular:  Negative for chest pain.  Gastrointestinal:  Positive for abdominal pain and constipation. Negative for nausea and vomiting.     Physical Exam Triage Vital Signs ED Triage Vitals  Enc Vitals Group     BP --      Pulse Rate 05/30/22 1000 85     Resp 05/30/22 1000 18     Temp 05/30/22 1000 98.2 F (36.8 C)     Temp Source 05/30/22 1000 Oral     SpO2 --      Weight --      Height --      Head Circumference --      Peak Flow --      Pain Score 05/30/22 0957 8     Pain Loc --      Pain Edu? --      Excl. in GC? --    No data found.  Updated Vital Signs BP (!) 185/78 (BP Location: Right Arm)   Pulse 85   Temp 98.2 F (36.8 C) (Oral)   Resp 18   SpO2 98%   Visual Acuity Right Eye Distance:   Left Eye Distance:   Bilateral Distance:    Right Eye Near:   Left Eye Near:    Bilateral Near:     Physical Exam Vitals and nursing note reviewed.  Constitutional:      General: He is not in acute distress.    Appearance: He is well-developed.  HENT:     Head: Normocephalic and atraumatic.  Eyes:     Conjunctiva/sclera: Conjunctivae normal.  Cardiovascular:     Rate and Rhythm: Normal rate and regular rhythm.     Heart sounds: No murmur heard. Pulmonary:     Effort: Pulmonary effort is normal. No respiratory distress.     Breath sounds: Normal breath sounds.  Abdominal:  General: Bowel sounds are normal. There is distension.     Palpations: Abdomen is soft.     Tenderness: There is abdominal tenderness in the right upper quadrant, right lower quadrant, epigastric area and left upper quadrant. There is no guarding or rebound.  Musculoskeletal:        General: No swelling.     Cervical back: Neck supple.  Skin:    General: Skin is warm and dry.     Capillary Refill: Capillary refill takes less than 2 seconds.  Neurological:     Mental Status:  He is alert.  Psychiatric:        Mood and Affect: Mood normal.      UC Treatments / Results  Labs (all labs ordered are listed, but only abnormal results are displayed) Labs Reviewed  POCT URINALYSIS DIP (MANUAL ENTRY) - Abnormal; Notable for the following components:      Result Value   Color, UA brown (*)    Glucose, UA =500 (*)    Bilirubin, UA moderate (*)    Ketones, POC UA >= (160) (*)    Blood, UA moderate (*)    Protein Ur, POC =100 (*)    All other components within normal limits  URINE CULTURE  CBC WITH DIFFERENTIAL/PLATELET  COMPREHENSIVE METABOLIC PANEL  LIPASE, BLOOD    EKG   Radiology DG Abd 2 Views  Result Date: 05/30/2022 CLINICAL DATA:  Constipation, abdominal pain. EXAM: ABDOMEN - 2 VIEW COMPARISON:  Abdominal radiographs 02/02/2019. Abdominal MRI 08/14/2020. FINDINGS: Supine and erect views (4 total) are submitted. There is a normal, nonobstructive bowel gas pattern. Colonic stool burden does not appear increased. No evidence of pneumoperitoneum, bowel wall thickening or suspicious abdominal calcification. Prostate brachytherapy seeds are noted. There are mild degenerative changes in the spine. IMPRESSION: No evidence of acute abdominal process. Electronically Signed   By: Carey Bullocks M.D.   On: 05/30/2022 11:32    Procedures Procedures (including critical care time)  Medications Ordered in UC Medications  alum & mag hydroxide-simeth (MAALOX/MYLANTA) 200-200-20 MG/5ML suspension 30 mL (30 mLs Oral Given 05/30/22 1148)    Initial Impression / Assessment and Plan / UC Course  I have reviewed the triage vital signs and the nursing notes.  Pertinent labs & imaging results that were available during my care of the patient were reviewed by me and considered in my medical decision making (see chart for details).  Vitals and triage reviewed, patient is hemodynamically stable.  Abdomen with obvious distention, active bowel sounds, right upper quadrant,  right lower quadrant and left upper quadrant tenderness, without guarding, rebound or palpable masses.  Abdominal x-ray negative for obstruction.  Patient had a bowel movement in clinic and felt relief afterwards, felt relief with belching with simethicone.  Patient wife concerned over continued distention, discussed further evaluation in the emergency room, patient would like to avoid this.   Urinalysis significant for blood, glucose, bilirubin, ketones, and protein. Hx of CKD and similar urine samples, will send for culture.    Will obtain basic labs and contact if results warrant emergent follow-up.  Advised following up with GI soon as possible.  Strict emergency precautions given, patient wife verbalized understanding. Discussed limitations of Urgent Care and resources of ED for imaging / labs / further evaluation.   No questions at this time.    Final Clinical Impressions(s) / UC Diagnoses   Final diagnoses:  Generalized abdominal pain  Constipation, unspecified constipation type     Discharge Instructions  Your x-ray was negative for obstruction.  You can take the Maalox suspension every 6 hours as needed for indigestion and belching.  We have obtained labs today and I will call if there is anything emergent. Your urine showed signs of dehydration, we are checking your kidney function with the lab work.   I am concerned that you have a continued level of distention and abdominal discomfort.  If your pain gets any worse, you are unable to follow-up with GI, or you have any changes, please go straight to the nearest emergency department for further evaluation and potential further imaging with a CT scan.        ED Prescriptions     Medication Sig Dispense Auth. Provider   alum & mag hydroxide-simeth (MAALOX PLUS) 400-400-40 MG/5ML suspension Take 10 mLs by mouth every 6 (six) hours as needed for indigestion. 355 mL Kace Hartje, [place], [place]      PDMP not reviewed this  encounter.   Symia Herdt, [place] N, [place] 05/30/22 1254

## 2022-05-30 NOTE — Telephone Encounter (Signed)
Dr. Leone Payor made aware via secure chat. Per MD, follow urgent care recommendations & seek ED if symptoms worsen. MD will follow up on labs when able.   Patient & wife have been made aware of recommendations, and again advised to go to ED if pain continues to worsen. Wife verbalized all understanding.

## 2022-05-30 NOTE — Telephone Encounter (Signed)
Patient calling states he is at urgent care they are going to fax papers over they are wanting to know if there is an emergency appointment where he can be seen today. Please advise

## 2022-05-30 NOTE — Assessment & Plan Note (Signed)
Chronic. Follows with heme/onc. Has low platelets. SCDs for prophylaxis.

## 2022-05-30 NOTE — Assessment & Plan Note (Signed)
May be due to cirrhosis. No ascites on CT abd. Repeat LFTs in AM.

## 2022-05-30 NOTE — Assessment & Plan Note (Signed)
Observation med/surg bed. IVF. Start Rocephin/flagyl. General surgery to see in consult.

## 2022-05-30 NOTE — Assessment & Plan Note (Signed)
Chronic. 

## 2022-05-30 NOTE — Consult Note (Signed)
Patrick Kidd 04/01/1945  [id number].    Requesting MD: Dr. Virgina Norfolk Chief Complaint/Reason for Consult: possible cholecystitis, elevated LFTs  HPI:  Patrick Kidd is a 77 yo male with a history of EtOH cirrhosis and CLL who presents to the ED with abdominal pain. He first began feeling unwell, with upper abdominal pain and vomiting about 3 days ago. It improved, then last night after dinner he developed pain, bloating and constipation. He has some mild pain in the RUQ. In the ED he has been febrile and tachycardic. WBC is normal. Tbili is elevated to 5.6 (from 2.2 on 4/3) and LFTs are otherwise normal. A CT scan showed stones in the gallbladder, with mild surrounding stranding, interpreted as possible cholecystitis.  He has not had any prior abdominal surgeries. He no longer drinks alcohol. He follows with Dr. Leone Payor at [facility] for management of his cirrhosis. His last EGD in Sept 2023 did not show any varices. He does have chronic thrombocytopenia and evidence of splenomegaly. His cirrhosis has previously been well-compensated, however based on today's labs his MELD is 19 (due to elevated Tbili) and he is a Child-Pugh class B.  ROS: Review of Systems  Constitutional:  Positive for fever. Negative for chills.  Respiratory:  Negative for sputum production.   Gastrointestinal:  Positive for abdominal pain, nausea and vomiting.    Family History  Problem Relation Age of Onset   Lung cancer Mother        smoker   Leukemia Father        Acute myelocytic   Diabetes Neg Hx    Stroke Neg Hx    Heart disease Neg Hx    Colon cancer Neg Hx    Colon polyps Neg Hx    Esophageal cancer Neg Hx    Rectal cancer Neg Hx    Stomach cancer Neg Hx     Past Medical History:  Diagnosis Date   Acrophobia    Alcoholic cirrhosis (HCC) 10/04/2015   Anemia    Arthritis    foot by big toe   BPH associated with nocturia    Cataract    removed both eyes   Chronic cough    PMH of   CLL  (chronic lymphocytic leukemia) (HCC)    COVID-19 12/2018   Diabetes mellitus without complication (HCC)    Diverticulosis 07/03/2010   Colonoscopy.    Duodenal ulcer 2017   Fallen arches    Bilateral   GERD (gastroesophageal reflux disease)    Gout    Granuloma annulare    Hx of adenomatous colonic polyps multiple   Hydrocele 2011   Large septated right hydrocele   Liver cyst    Liver lesion    Nonspecific elevation of levels of transaminase or lactic acid dehydrogenase (LDH)    Obesity    Peripheral neuropathy    Plantar fasciitis    PMH of   Portal hypertension (HCC) 2017   Prostate cancer (HCC)    Right shoulder pain 11/2017   Sleep apnea    no cpap, patient denies   Thiamine deficiency    Wears reading eyeglasses    WPW (Wolff-Parkinson-White syndrome) 02/05/2019    Past Surgical History:  Procedure Laterality Date   BACK SURGERY  04/28/2022   CATARACT EXTRACTION, BILATERAL  12/2011   Dr Nile Riggs   COLONOSCOPY  2017   COLONOSCOPY W/ POLYPECTOMY  07/03/2010   2 adenomas, diverticulosis on right. Dr Leone Payor   CYSTOSCOPY N/A 03/15/2018  Procedure: CYSTOSCOPY FLEXIBLE;  Surgeon: Crista Elliot, MD;  Location: [facility];  Service: Urology;  Laterality: N/A;  NO SEEDS FOUND IN BLADDER   ESOPHAGOGASTRODUODENOSCOPY (EGD) WITH PROPOFOL N/A 01/08/2016   Procedure: ESOPHAGOGASTRODUODENOSCOPY (EGD) WITH PROPOFOL;  Surgeon: Meryl Dare, MD;  Location: [facility];  Service: Endoscopy;  Laterality: N/A;   FLEXIBLE SIGMOIDOSCOPY  2000   RADIOACTIVE SEED IMPLANT N/A 03/15/2018   Procedure: RADIOACTIVE SEED IMPLANT/BRACHYTHERAPY IMPLANT;  Surgeon: Crista Elliot, MD;  Location: [facility] [place];  Service: Urology;  Laterality: N/A;   73 SEEDS IMPLANTED   SIGMOIDOSCOPY     SPACE OAR INSTILLATION N/A 03/15/2018   Procedure: SPACE OAR INSTILLATION;  Surgeon: Crista Elliot, MD;  Location: [facility];  Service: Urology;   Laterality: N/A;   WISDOM TOOTH EXTRACTION      Social History:  reports that he quit smoking about 51 years ago. His smoking use included cigarettes. He has a 12.00 pack-year smoking history. He has never been exposed to tobacco smoke. He has never used smokeless tobacco. He reports that he does not currently use alcohol. He reports that he does not use drugs.  Allergies:  Allergies  Allergen Reactions   Rifaximin Nausea And Vomiting    Required EMS visit, although patient did not go to hospital   Beta Adrenergic Blockers     Likely WPW.  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  Use with caution.    (Not in a hospital admission)    Physical Exam: Blood pressure (!) 171/76, pulse (!) 122, temperature (!) 100.8 F (38.2 C), temperature source Oral, resp. rate (!) 26, SpO2 98 %. General: resting comfortably, appears stated age, no apparent distress Neurological: alert and oriented, no focal deficits, cranial nerves grossly in tact HEENT: scleral icterus CV: tachycardic 120, regular Respiratory: normal work of breathing on room air Abdomen: soft, nondistended, very minimal tenderness in the upper abdomen. Extremities: warm and well-perfused, no deformities, moving all extremities spontaneously Psychiatric: normal mood and affect Skin: warm and dry, no jaundice, no rashes or lesions   Results for orders placed or performed during the hospital encounter of 05/30/22 (from the past 48 hour(s))  Urinalysis, Routine w reflex microscopic -Urine, Clean Catch     Status: Abnormal   Collection Time: 05/30/22  3:26 PM  Result Value Ref Range   Color, Urine AMBER (A) YELLOW    Comment: BIOCHEMICALS MAY BE AFFECTED BY COLOR   APPearance CLEAR CLEAR   Specific Gravity, Urine 1.022 1.005 - 1.030   pH 5.0 5.0 - 8.0   Glucose, UA >=500 (A) NEGATIVE mg/dL   Hgb urine dipstick MODERATE (A) NEGATIVE   Bilirubin Urine NEGATIVE NEGATIVE   Ketones, ur 80 (A) NEGATIVE mg/dL    Protein, ur 161 (A) NEGATIVE mg/dL   Nitrite NEGATIVE NEGATIVE   Leukocytes,Ua NEGATIVE NEGATIVE   RBC / HPF 0-5 0 - 5 RBC/hpf   WBC, UA 0-5 0 - 5 WBC/hpf   Bacteria, UA RARE (A) NONE SEEN   Squamous Epithelial / HPF 0-5 0 - 5 /HPF   Mucus PRESENT     Comment: Performed at [facility], [address], [place], [place] [address]  Protime-INR     Status: None   Collection Time: 05/30/22  5:17 PM  Result Value Ref Range   Prothrombin Time 14.7 11.4 - 15.2 seconds   INR 1.2 0.8 - 1.2  Comment: (NOTE) INR goal varies based on device and disease states. Performed at [facility], [address], [place], [place] [address]    CT ABDOMEN PELVIS W CONTRAST  Result Date: 05/30/2022 CLINICAL DATA:  Abdominal pain, vomiting, constipation EXAM: CT ABDOMEN AND PELVIS WITH CONTRAST TECHNIQUE: Multidetector CT imaging of the abdomen and pelvis was performed using the standard protocol following bolus administration of intravenous contrast. RADIATION DOSE REDUCTION: This exam was performed according to the departmental dose-optimization program which includes automated exposure control, adjustment of the mA and/or kV according to patient size and/or use of iterative reconstruction technique. CONTRAST:  75mL OMNIPAQUE IOHEXOL 350 MG/ML SOLN COMPARISON:  01/28/2019 FINDINGS: Lower chest: Small linear densities are seen in lower lung fields suggesting scarring or subsegmental atelectasis. Coronary artery calcifications are seen. Hepatobiliary: There is nodularity in the liver surface. There is 6.4 cm smooth marginated fluid density lesion in the right lobe suggesting cyst. There is layering high density in the dependent portion of gallbladder lumen. There is mild wall thickening in gallbladder. There is stranding in the fat adjacent to the gallbladder. There is no dilation of bile ducts. Pancreas: No focal abnormalities are seen in pancreas. Diverticulum is noted along the inner margin of second  portion of duodenum. Spleen: Spleen measures 15.3 cm in maximum diameter. Adrenals/Urinary Tract: Adrenals are unremarkable. There is no hydronephrosis. There is possible 1 mm right renal calculus. Ureters are not dilated. Urinary bladder is not distended. There is mild diffuse wall thickening in the bladder. Stomach/Bowel: Stomach is not distended. Small bowel loops are not dilated. Appendix is not dilated. Scattered diverticula are seen in colon. There is no evidence of focal acute diverticulitis. Vascular/Lymphatic: Scattered arterial calcifications are seen. There are scattered subcentimeter nodes suggesting reactive hyperplasia. Reproductive: Metallic densities are seen in prostate suggesting previous brachytherapy. Other: There is no ascites or pneumoperitoneum. Small bilateral inguinal hernias containing fat are seen. Musculoskeletal: Degenerative changes are noted in thoracic and lumbar spine with bony spurs. IMPRESSION: There is no evidence of intestinal obstruction or pneumoperitoneum. There is no hydronephrosis. Appendix is not dilated. Gallbladder is distended. There is layering of high density in the dependent portion of gallbladder suggesting presence of sludge and stones. There is mild pericholecystic stranding. Possibility of acute cholecystitis is not excluded. If clinically warranted, follow-up gallbladder sonogram may be considered. There is no dilation of bile ducts. Coronary artery disease. There is a 6.4 cm hepatic cyst. Cirrhosis of liver. Splenomegaly. There is tiny nonobstructing right renal calculus. Scattered diverticula are seen in colon without signs of focal diverticulitis. There is mild diffuse wall thickening in the urinary bladder which may be due to incomplete distention or suggest cystitis. Electronically Signed   By: Ernie Avena M.D.   On: 05/30/2022 20:43   DG Abd 2 Views  Result Date: 05/30/2022 CLINICAL DATA:  Constipation, abdominal pain. EXAM: ABDOMEN - 2 VIEW  COMPARISON:  Abdominal radiographs 02/02/2019. Abdominal MRI 08/14/2020. FINDINGS: Supine and erect views (4 total) are submitted. There is a normal, nonobstructive bowel gas pattern. Colonic stool burden does not appear increased. No evidence of pneumoperitoneum, bowel wall thickening or suspicious abdominal calcification. Prostate brachytherapy seeds are noted. There are mild degenerative changes in the spine. IMPRESSION: No evidence of acute abdominal process. Electronically Signed   By: Carey Bullocks M.D.   On: 05/30/2022 11:32      Assessment/Plan This is a 77 yo male with a history of EtOH cirrhosis, presenting with RUQ pain, nausea and vomiting. I  personally reviewed his labs, referral notes and imaging. The liver is enlarged and cirrhotic, and there are stones in the gallbladder. There are no obvious venous collaterals, however the spleen is enlarged and the patient also has thrombocytopenia, suggesting portal hypertension. His new hyperbilirubinemia may be due to intrinsic liver disease and not related to the gallbladder. The stranding around the gallbladder may be secondary to cirrhosis. Agree with an ultrasound to further evaluate for cholecystitis. If the patient does have cholecystitis, he would be at significantly increased risk of surgical complications given his underlying portal hypertension.  - RUQ ultrasound pending - Trend LFTs - If LFTs continue to rise, proceed with MRCP to rule out choledocholithiasis. - Ok for clear liquids - Surgery will follow   Sophronia Simas, MD  [facility] General, Hepatobiliary and Pancreatic Surgery 05/30/22 10:05 PM

## 2022-05-30 NOTE — Discharge Instructions (Addendum)
Your x-ray was negative for obstruction.  You can take the Maalox suspension every 6 hours as needed for indigestion and belching.  We have obtained labs today and I will call if there is anything emergent. Your urine showed signs of dehydration, we are checking your kidney function with the lab work.   I am concerned that you have a continued level of distention and abdominal discomfort.  If your pain gets any worse, you are unable to follow-up with GI, or you have any changes, please go straight to the nearest emergency department for further evaluation and potential further imaging with a CT scan.

## 2022-05-30 NOTE — ED Notes (Signed)
Pt was incontinent of urine. Assisted pt to standing position and performed bedside cleaning, full linen change, and application of brief. Pt returned to bed and was repositioned without difficulty. No needs at this time. Per consulting surgeon, OK for liquids until midnight; NPO after midnight. Pt and wife at bedside are aware and verbalize understanding.

## 2022-05-30 NOTE — Telephone Encounter (Signed)
Patient is calling states he is not feeling well and "needs an appointment in 15 mins" advised him to go to ED if he could not wait until his appt I did not have one sooner so he is wishing to speak to the nurse. Please advise

## 2022-05-30 NOTE — Subjective & Objective (Signed)
CC: abd pain, N/V HPI: 77 year old male history of alcoholic cirrhosis, CLL, diabetes, hypertension, peripheral neuropathy, morbid obesity, history of Wolff-Parkinson-White syndrome, prior history of prostate cancer, presents to the ER today with 5-day history of feeling poorly.  He started having vomiting x 1 on Tuesday.  Began having abdominal pain the same day.  Has noted dark-colored urine for the last 3 to 4 days.  He had 4 loose stools today.  He had a fever on Monday night.  He has had low-grade temperatures the rest of the week.  Was seen in urgent care today.  Had labs drawn.  Bilirubin came back elevated at 5.6.  It had been 2.2 about 1 month ago.  He was referred to the ER for evaluation.  On arrival temp 98.3 but increased to 100.8 heart rate 98 blood pressure 198/83.  Heart rate is increased to 122.  White count 8.8, hemoglobin 14.3, platelets 81 Sodium 132, potassium 4.3, chloride 94, bicarb 24, BUN of 15, creatinine  1.05, glucose of 317, total protein 6.9, albumin 3.3, AST 34, ALT 36, alk phos 121, total bili 5.6  INR 1.2  CT abdomen pelvis demonstrated high layer density material in the dependent portion of the gallbladder.  Mild wall thickening.  Mild fat stranding adjacent to the gallbladder.  No dilatation of the bile ducts.  Liver is cirrhotic.  EDP discussed the case Dr. Sophronia Simas with surgery.  Dr. Freida Busman recommended admission to the hospital to the hospitalist.  And to order a MRCP if the patient's LFTs continue to rise.  Surgery will see the patient in the morning.

## 2022-05-30 NOTE — Telephone Encounter (Signed)
Patient returned call & stated that he went to urgent care earlier today for complaints of generalized abdominal pain (10/10), bloating, and constipation. He had a fever earlier in the week, along with three episodes of vomiting. Since then he has been able to tolerate food, however pain has worsened. Last BM was today. Urgent care drew labs (currently pending) & xray that was negative for obstruction per note. Pt states he was prescribed maalox, but feels that his pain was not addressed at the visit. He is currently scheduled to see Dr. Leone Payor on 06/18/22, but feels that he cannot wait that long. Will route to MD for further recommendations. Discussed ED precautions with patient in the meantime.

## 2022-05-30 NOTE — Telephone Encounter (Signed)
Patient is returning your call.  

## 2022-05-30 NOTE — Telephone Encounter (Signed)
Discussed that due to elevated bilirubin and abdominal distention that he should head to the nearest ED for further evaluation and potential imaging.

## 2022-05-30 NOTE — ED Triage Notes (Signed)
Pt came in via POV d/t several days ago beginning to have emesis episodes then bloating & constipation. Rates overall pain 10/10 while in triage & abd pain as well, has had 4 loose stools since this morning.

## 2022-05-30 NOTE — ED Notes (Signed)
Called lab to request add-on of HFP and INR

## 2022-05-30 NOTE — Assessment & Plan Note (Signed)
Continue neurontin 

## 2022-05-30 NOTE — ED Provider Notes (Signed)
[place] EMERGENCY DEPARTMENT AT [facility] Provider Note   CSN: [id number] Arrival date & time: 05/30/22  1526     History  Chief Complaint  Patient presents with   Abdominal Pain   Constipation    YONASON FICHTER is a 77 y.o. male.   Abdominal Pain Associated symptoms: constipation   Constipation Associated symptoms: abdominal pain   77 year old male presents with concerns of abdominal pain (primarily epigastric region) since Tuesday and several emesis episodes on that same day that was nonbloody.  Endorses change in color of urine.  Denies fever, he has had several bowel movements in the last 24 hours.  He has not taken any medications.  Denies alcohol use for the last 8 to 9 months.  Medical history includes T2DM, alcoholic cirrhosis, HLD, CKD 3A, paroxysmal A-fib, CLL. Of note, patient was seen in [facility] urgent care for this same complaint.  I obtain basic labs and urine condition to negative abdominal x-ray for obstruction.  Labs returned with elevated bilirubin and he was advised to proceed to nearest ED.    Home Medications Prior to Admission medications   Medication Sig Start Date End Date Taking? Authorizing Provider  alum & mag hydroxide-simeth (MAALOX PLUS) 400-400-40 MG/5ML suspension Take 10 mLs by mouth every 6 (six) hours as needed for indigestion. 05/30/22   Garrison, [place] N, FNP  Continuous Blood Gluc Receiver (FREESTYLE LIBRE 3 READER) DEVI 1 Act by Does not apply route daily. 05/07/22   Etta Grandchild, MD  Continuous Blood Gluc Sensor (FREESTYLE LIBRE 3 SENSOR) MISC 1 Act by Does not apply route daily. Place 1 sensor on the skin every 14 days. Use to check glucose continuously 05/07/22   Etta Grandchild, MD  gabapentin (NEURONTIN) 300 MG capsule Take 1 capsule (300 mg total) by mouth 3 (three) times daily. 11/05/21   Etta Grandchild, MD  insulin glargine, 2 Unit Dial, (TOUJEO MAX SOLOSTAR) 300 UNIT/ML Solostar Pen Inject 50 Units into the skin daily.  Via SANOFI pt assistance 05/08/22   Etta Grandchild, MD  Insulin Pen Needle (PEN NEEDLES) 32G X 5 MM MISC Inject 1 Act into the skin daily. Use to administer insulin. Dx E11.9 Dispense based on insurance preference. 05/08/22   Etta Grandchild, MD  Lancets (ACCU-CHEK MULTICLIX) lancets Use to check sugar daily. DX: E11.8 12/21/17   Etta Grandchild, MD  metFORMIN (GLUCOPHAGE) 500 MG tablet Take 1 tablet (500 mg total) by mouth 2 (two) times daily with a meal. 11/05/21   Etta Grandchild, MD  pantoprazole (PROTONIX) 40 MG tablet TAKE 1 TABLET 30 MINUTES BEFORE MEALS (BREAKFAST AND SUPPER) TWICE A DAY 02/12/22   Etta Grandchild, MD      Allergies    Rifaximin, Beta adrenergic blockers, and Calcium channel blockers    Review of Systems   Review of Systems  Gastrointestinal:  Positive for abdominal pain and constipation.  All other systems reviewed and are negative.  Physical Exam Updated Vital Signs BP (!) 167/89   Pulse (!) 122   Temp (!) 100.8 F (38.2 C) (Oral)   Resp (!) 23   SpO2 99%  Physical Exam Constitutional:      General: He is not in acute distress.    Appearance: He is well-developed.  Cardiovascular:     Rate and Rhythm: Regular rhythm. Tachycardia present.     Heart sounds: No murmur heard. Abdominal:     General: Bowel sounds are normal.  Palpations: Abdomen is soft.     Tenderness: There is abdominal tenderness in the right upper quadrant and epigastric area. There is no guarding.     Hernia: A hernia is present. Hernia is present in the umbilical area.  Skin:    General: Skin is warm and dry.  Neurological:     Mental Status: He is alert and oriented to person, place, and time.  Psychiatric:        Mood and Affect: Mood normal.        Behavior: Behavior normal.    ED Results / Procedures / Treatments   Labs (all labs ordered are listed, but only abnormal results are displayed) Labs Reviewed  URINALYSIS, ROUTINE W REFLEX MICROSCOPIC - Abnormal; Notable for the  following components:      Result Value   Color, Urine AMBER (*)    Glucose, UA >=500 (*)    Hgb urine dipstick MODERATE (*)    Ketones, ur 80 (*)    Protein, ur 100 (*)    Bacteria, UA RARE (*)    All other components within normal limits  CULTURE, BLOOD (ROUTINE X 2)  CULTURE, BLOOD (ROUTINE X 2)  PROTIME-INR  LACTIC ACID, PLASMA  LACTIC ACID, PLASMA    EKG EKG Interpretation  Date/Time:  Friday May 30 2022 17:58:47 EDT Ventricular Rate:  124 PR Interval:  150 QRS Duration: 87 QT Interval:  312 QTC Calculation: 449 R Axis:   71 Text Interpretation: Sinus tachycardia Multiple ventricular premature complexes Confirmed by Virgina Norfolk ([id number]) on 05/30/2022 6:24:14 PM  Radiology CT ABDOMEN PELVIS W CONTRAST  Result Date: 05/30/2022 CLINICAL DATA:  Abdominal pain, vomiting, constipation EXAM: CT ABDOMEN AND PELVIS WITH CONTRAST TECHNIQUE: Multidetector CT imaging of the abdomen and pelvis was performed using the standard protocol following bolus administration of intravenous contrast. RADIATION DOSE REDUCTION: This exam was performed according to the departmental dose-optimization program which includes automated exposure control, adjustment of the mA and/or kV according to patient size and/or use of iterative reconstruction technique. CONTRAST:  75mL OMNIPAQUE IOHEXOL 350 MG/ML SOLN COMPARISON:  01/28/2019 FINDINGS: Lower chest: Small linear densities are seen in lower lung fields suggesting scarring or subsegmental atelectasis. Coronary artery calcifications are seen. Hepatobiliary: There is nodularity in the liver surface. There is 6.4 cm smooth marginated fluid density lesion in the right lobe suggesting cyst. There is layering high density in the dependent portion of gallbladder lumen. There is mild wall thickening in gallbladder. There is stranding in the fat adjacent to the gallbladder. There is no dilation of bile ducts. Pancreas: No focal abnormalities are seen in pancreas.  Diverticulum is noted along the inner margin of second portion of duodenum. Spleen: Spleen measures 15.3 cm in maximum diameter. Adrenals/Urinary Tract: Adrenals are unremarkable. There is no hydronephrosis. There is possible 1 mm right renal calculus. Ureters are not dilated. Urinary bladder is not distended. There is mild diffuse wall thickening in the bladder. Stomach/Bowel: Stomach is not distended. Small bowel loops are not dilated. Appendix is not dilated. Scattered diverticula are seen in colon. There is no evidence of focal acute diverticulitis. Vascular/Lymphatic: Scattered arterial calcifications are seen. There are scattered subcentimeter nodes suggesting reactive hyperplasia. Reproductive: Metallic densities are seen in prostate suggesting previous brachytherapy. Other: There is no ascites or pneumoperitoneum. Small bilateral inguinal hernias containing fat are seen. Musculoskeletal: Degenerative changes are noted in thoracic and lumbar spine with bony spurs. IMPRESSION: There is no evidence of intestinal obstruction or pneumoperitoneum. There is no hydronephrosis. Appendix  is not dilated. Gallbladder is distended. There is layering of high density in the dependent portion of gallbladder suggesting presence of sludge and stones. There is mild pericholecystic stranding. Possibility of acute cholecystitis is not excluded. If clinically warranted, follow-up gallbladder sonogram may be considered. There is no dilation of bile ducts. Coronary artery disease. There is a 6.4 cm hepatic cyst. Cirrhosis of liver. Splenomegaly. There is tiny nonobstructing right renal calculus. Scattered diverticula are seen in colon without signs of focal diverticulitis. There is mild diffuse wall thickening in the urinary bladder which may be due to incomplete distention or suggest cystitis. Electronically Signed   By: Ernie Avena M.D.   On: 05/30/2022 20:43   DG Abd 2 Views  Result Date: 05/30/2022 CLINICAL DATA:   Constipation, abdominal pain. EXAM: ABDOMEN - 2 VIEW COMPARISON:  Abdominal radiographs 02/02/2019. Abdominal MRI 08/14/2020. FINDINGS: Supine and erect views (4 total) are submitted. There is a normal, nonobstructive bowel gas pattern. Colonic stool burden does not appear increased. No evidence of pneumoperitoneum, bowel wall thickening or suspicious abdominal calcification. Prostate brachytherapy seeds are noted. There are mild degenerative changes in the spine. IMPRESSION: No evidence of acute abdominal process. Electronically Signed   By: Carey Bullocks M.D.   On: 05/30/2022 11:32    Procedures Procedures    Medications Ordered in ED Medications  piperacillin-tazobactam (ZOSYN) IVPB 3.375 g (has no administration in time range)  sodium chloride 0.9 % bolus 1,000 mL (0 mLs Intravenous Stopped 05/30/22 1801)  fentaNYL (SUBLIMAZE) injection 50 mcg (50 mcg Intravenous Given 05/30/22 1655)  ondansetron (ZOFRAN) injection 4 mg (4 mg Intravenous Given 05/30/22 1658)  iohexol (OMNIPAQUE) 350 MG/ML injection 75 mL (75 mLs Intravenous Contrast Given 05/30/22 2023)    ED Course/ Medical Decision Making/ A&P                             Medical Decision Making 77 year old male with medical history of alcoholic cirrhosis and CLL presenting with abdominal pain/epigastric pain.  Differential diagnosis considered for malignancy, cholecystitis, biliary obstruction, pancreatitis.  He presented after urgent care visit which is T. bili elevated to 5.2 up from 2.2 three weeks prior.  EKG revealed sinus tachycardia with several PVCs.  CT abdomen pelvis revealed distended gallbladder with presence of sludge and stones and mild pericholecystic stranding.  There is also a 6.4 cm hepatic cyst, cirrhosis of liver, splenomegaly.  No evidence of intestinal obstruction or hydronephrosis and appendix is appearing normal.  Patient developed fever of 100.8.  Discussed case with surgery (Dr. Freida Busman) and admitted by medicine Dr.  Imogene Burn.  Recommended RUQ ultrasound, blood cultures, antibiotic initiation.  Patient amenable to admission.  Amount and/or Complexity of Data Reviewed Labs: ordered. Radiology: ordered.  Risk Prescription drug management.         Final Clinical Impression(s) / ED Diagnoses Final diagnoses:  Fever, unspecified fever cause  Elevated bilirubin    Rx / DC Orders ED Discharge Orders     None         Shelby Mattocks, DO 05/30/22 2134    Virgina Norfolk, DO 06/04/22 1610

## 2022-05-30 NOTE — Assessment & Plan Note (Signed)
Avoid betablockers and non-dihydropyridine calcium channel blockers(cardizem, verpamil). Pt states he "outgrew" his WPW in his early 20-30s.

## 2022-05-30 NOTE — ED Notes (Signed)
ED TO INPATIENT HANDOFF REPORT  ED Nurse Name and Phone #: Pennie Rushing A. Laquonda Welby, RN [phone]  S Name/Age/Gender Wyn Forster Demars 77 y.o. male Room/Bed: 027C/027C  Code Status   Code Status: Full Code  Home/SNF/Other Home Patient oriented to: self, place, time, and situation Is this baseline? Yes   Triage Complete: Triage complete  Chief Complaint Sludge in gallbladder [K82.8]  Triage Note Pt came in via POV d/t several days ago beginning to have emesis episodes then bloating & constipation. Rates overall pain 10/10 while in triage & abd pain as well, has had 4 loose stools since this morning.   Allergies Allergies  Allergen Reactions   Rifaximin Nausea And Vomiting    Required EMS visit, although patient did not go to hospital   Beta Adrenergic Blockers     Likely WPW.  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  Use with caution.    Level of Care/Admitting Diagnosis ED Disposition     ED Disposition  Admit   Condition  --   Comment  Hospital Area: [facility] [[id number]]  Level of Care: Med-Surg [16]  May place patient in observation at [facility] or [facility] if equivalent level of care is available:: No  Covid Evaluation: Asymptomatic - no recent exposure (last 10 days) testing not required  Diagnosis: Sludge in gallbladder [811914]  Admitting Physician: Imogene Burn, ERIC [[id number]]  Attending Physician: Imogene Burn, ERIC [[id number]]          B Medical/Surgery History Past Medical History:  Diagnosis Date   Acrophobia    Alcoholic cirrhosis (HCC) 10/04/2015   Anemia    Arthritis    foot by big toe   BPH associated with nocturia    Cataract    removed both eyes   Chronic cough    PMH of   CLL (chronic lymphocytic leukemia) (HCC)    COVID-19 12/2018   Diabetes mellitus without complication (HCC)    Diverticulosis 07/03/2010   Colonoscopy.    Duodenal ulcer 2017   Fallen arches    Bilateral   GERD (gastroesophageal reflux disease)     Gout    Granuloma annulare    Hx of adenomatous colonic polyps multiple   Hydrocele 2011   Large septated right hydrocele   Liver cyst    Liver lesion    Nonspecific elevation of levels of transaminase or lactic acid dehydrogenase (LDH)    Obesity    Peripheral neuropathy    Plantar fasciitis    PMH of   Portal hypertension (HCC) 2017   Prostate cancer (HCC)    Right shoulder pain 11/2017   Sleep apnea    no cpap, patient denies   Thiamine deficiency    Wears reading eyeglasses    WPW (Wolff-Parkinson-White syndrome) 02/05/2019   Past Surgical History:  Procedure Laterality Date   BACK SURGERY  04/28/2022   CATARACT EXTRACTION, BILATERAL  12/2011   Dr Nile Riggs   COLONOSCOPY  2017   COLONOSCOPY W/ POLYPECTOMY  07/03/2010   2 adenomas, diverticulosis on right. Dr Leone Payor   CYSTOSCOPY N/A 03/15/2018   Procedure: Derinda Late;  Surgeon: Crista Elliot, MD;  Location: [facility];  Service: Urology;  Laterality: N/A;  NO SEEDS FOUND IN BLADDER   ESOPHAGOGASTRODUODENOSCOPY (EGD) WITH PROPOFOL N/A 01/08/2016   Procedure: ESOPHAGOGASTRODUODENOSCOPY (EGD) WITH PROPOFOL;  Surgeon: Meryl Dare, MD;  Location: [facility];  Service: Endoscopy;  Laterality: N/A;   FLEXIBLE  SIGMOIDOSCOPY  2000   RADIOACTIVE SEED IMPLANT N/A 03/15/2018   Procedure: RADIOACTIVE SEED IMPLANT/BRACHYTHERAPY IMPLANT;  Surgeon: Crista Elliot, MD;  Location: [facility];  Service: Urology;  Laterality: N/A;   73 SEEDS IMPLANTED   SIGMOIDOSCOPY     SPACE OAR INSTILLATION N/A 03/15/2018   Procedure: SPACE OAR INSTILLATION;  Surgeon: Crista Elliot, MD;  Location: [facility];  Service: Urology;  Laterality: N/A;   WISDOM TOOTH EXTRACTION       A IV Location/Drains/Wounds Patient Lines/Drains/Airways Status     Active Line/Drains/Airways     Name Placement date Placement time Site Days   Peripheral IV 05/30/22 20 G 1" Anterior;Left  Forearm 05/30/22  1654  Forearm  less than 1   Peripheral IV 05/30/22 20 G 1" Anterior;Right Forearm 05/30/22  2124  Forearm  less than 1   Peripheral IV 05/30/22 20 G 1.88" Left Forearm 05/30/22  2136  Forearm  less than 1            Intake/Output Last 24 hours  Intake/Output Summary (Last 24 hours) at 05/30/2022 2329 Last data filed at 05/30/2022 2216 Gross per 24 hour  Intake 1050 ml  Output --  Net 1050 ml    Labs/Imaging Results for orders placed or performed during the hospital encounter of 05/30/22 (from the past 48 hour(s))  Urinalysis, Routine w reflex microscopic -Urine, Clean Catch     Status: Abnormal   Collection Time: 05/30/22  3:26 PM  Result Value Ref Range   Color, Urine AMBER (A) YELLOW    Comment: BIOCHEMICALS MAY BE AFFECTED BY COLOR   APPearance CLEAR CLEAR   Specific Gravity, Urine 1.022 1.005 - 1.030   pH 5.0 5.0 - 8.0   Glucose, UA >=500 (A) NEGATIVE mg/dL   Hgb urine dipstick MODERATE (A) NEGATIVE   Bilirubin Urine NEGATIVE NEGATIVE   Ketones, ur 80 (A) NEGATIVE mg/dL   Protein, ur 098 (A) NEGATIVE mg/dL   Nitrite NEGATIVE NEGATIVE   Leukocytes,Ua NEGATIVE NEGATIVE   RBC / HPF 0-5 0 - 5 RBC/hpf   WBC, UA 0-5 0 - 5 WBC/hpf   Bacteria, UA RARE (A) NONE SEEN   Squamous Epithelial / HPF 0-5 0 - 5 /HPF   Mucus PRESENT     Comment: Performed at [facility], [address], [place], [place] [address]  Protime-INR     Status: None   Collection Time: 05/30/22  5:17 PM  Result Value Ref Range   Prothrombin Time 14.7 11.4 - 15.2 seconds   INR 1.2 0.8 - 1.2    Comment: (NOTE) INR goal varies based on device and disease states. Performed at [facility], [address], [place], [place] [address]   Lactic acid, plasma     Status: None   Collection Time: 05/30/22  9:24 PM  Result Value Ref Range   Lactic Acid, Venous 1.7 0.5 - 1.9 mmol/L    Comment: Performed at [facility], [address], [place], [place] [address]   US Abdomen  Limited RUQ (LIVER/GB)  Result Date: 05/30/2022 CLINICAL DATA:  Elevated bilirubin EXAM: ULTRASOUND ABDOMEN LIMITED RIGHT UPPER QUADRANT COMPARISON:  CT from earlier in the same day, ultrasound from 02/25/2022. FINDINGS: Gallbladder: Gallbladder is well distended with multiple stones and gallbladder sludge within. Mild wall thickening at 4 mm is noted. No pericholecystic fluid is seen. Negative sonographic Murphy's sign is elicited. Common bile duct: Diameter: 4 mm Liver:  6.4 cm cyst is noted in the right lobe of the liver similar to that seen on the prior exam. Some dependent density is noted within which may be related to prior hemorrhage. No other focal abnormality is noted in the liver. Portal vein is patent on color Doppler imaging with normal direction of blood flow towards the liver. Other: None. IMPRESSION: Gallstones and gallbladder sludge with mild wall thickening. Negative sonographic Murphy's sign is elicited. These changes are stable from the prior ultrasound exam. Hepatic cyst similar to that seen on recent CT examination. Dependent density is noted within the overall appearance is similar to that seen on prior exam from January of 2024. Electronically Signed   By: Alcide Clever M.D.   On: 05/30/2022 23:01   CT ABDOMEN PELVIS W CONTRAST  Result Date: 05/30/2022 CLINICAL DATA:  Abdominal pain, vomiting, constipation EXAM: CT ABDOMEN AND PELVIS WITH CONTRAST TECHNIQUE: Multidetector CT imaging of the abdomen and pelvis was performed using the standard protocol following bolus administration of intravenous contrast. RADIATION DOSE REDUCTION: This exam was performed according to the departmental dose-optimization program which includes automated exposure control, adjustment of the mA and/or kV according to patient size and/or use of iterative reconstruction technique. CONTRAST:  75mL OMNIPAQUE IOHEXOL 350 MG/ML SOLN COMPARISON:  01/28/2019 FINDINGS: Lower chest: Small linear densities are seen in  lower lung fields suggesting scarring or subsegmental atelectasis. Coronary artery calcifications are seen. Hepatobiliary: There is nodularity in the liver surface. There is 6.4 cm smooth marginated fluid density lesion in the right lobe suggesting cyst. There is layering high density in the dependent portion of gallbladder lumen. There is mild wall thickening in gallbladder. There is stranding in the fat adjacent to the gallbladder. There is no dilation of bile ducts. Pancreas: No focal abnormalities are seen in pancreas. Diverticulum is noted along the inner margin of second portion of duodenum. Spleen: Spleen measures 15.3 cm in maximum diameter. Adrenals/Urinary Tract: Adrenals are unremarkable. There is no hydronephrosis. There is possible 1 mm right renal calculus. Ureters are not dilated. Urinary bladder is not distended. There is mild diffuse wall thickening in the bladder. Stomach/Bowel: Stomach is not distended. Small bowel loops are not dilated. Appendix is not dilated. Scattered diverticula are seen in colon. There is no evidence of focal acute diverticulitis. Vascular/Lymphatic: Scattered arterial calcifications are seen. There are scattered subcentimeter nodes suggesting reactive hyperplasia. Reproductive: Metallic densities are seen in prostate suggesting previous brachytherapy. Other: There is no ascites or pneumoperitoneum. Small bilateral inguinal hernias containing fat are seen. Musculoskeletal: Degenerative changes are noted in thoracic and lumbar spine with bony spurs. IMPRESSION: There is no evidence of intestinal obstruction or pneumoperitoneum. There is no hydronephrosis. Appendix is not dilated. Gallbladder is distended. There is layering of high density in the dependent portion of gallbladder suggesting presence of sludge and stones. There is mild pericholecystic stranding. Possibility of acute cholecystitis is not excluded. If clinically warranted, follow-up gallbladder sonogram may be  considered. There is no dilation of bile ducts. Coronary artery disease. There is a 6.4 cm hepatic cyst. Cirrhosis of liver. Splenomegaly. There is tiny nonobstructing right renal calculus. Scattered diverticula are seen in colon without signs of focal diverticulitis. There is mild diffuse wall thickening in the urinary bladder which may be due to incomplete distention or suggest cystitis. Electronically Signed   By: Ernie Avena M.D.   On: 05/30/2022 20:43   DG Abd 2 Views  Result Date: 05/30/2022 CLINICAL DATA:  Constipation, abdominal pain. EXAM: ABDOMEN - 2  VIEW COMPARISON:  Abdominal radiographs 02/02/2019. Abdominal MRI 08/14/2020. FINDINGS: Supine and erect views (4 total) are submitted. There is a normal, nonobstructive bowel gas pattern. Colonic stool burden does not appear increased. No evidence of pneumoperitoneum, bowel wall thickening or suspicious abdominal calcification. Prostate brachytherapy seeds are noted. There are mild degenerative changes in the spine. IMPRESSION: No evidence of acute abdominal process. Electronically Signed   By: Carey Bullocks M.D.   On: 05/30/2022 11:32    Pending Labs Unresulted Labs (From admission, onward)     Start     Ordered   05/30/22 2106  Blood culture (routine x 2)  BLOOD CULTURE X 2,   R (with STAT occurrences)      05/30/22 2106   Signed and Held  Comprehensive metabolic panel  Tomorrow morning,   R        Signed and Held   Signed and Held  Magnesium  Tomorrow morning,   R        Signed and Held   Signed and Held  CBC with Differential/Platelet  Tomorrow morning,   R        Signed and Held   Signed and Held  Procalcitonin  Add-on,   R       References:    Procalcitonin Lower Respiratory Tract Infection AND Sepsis Procalcitonin Algorithm   Signed and Held   Signed and Held  Procalcitonin  Tomorrow morning,   R       References:    Procalcitonin Lower Respiratory Tract Infection AND Sepsis Procalcitonin Algorithm   Signed and Held             Vitals/Pain Today's Vitals   05/30/22 2002 05/30/22 2024 05/30/22 2130 05/30/22 2256  BP:  (!) 167/89 (!) 171/76   Pulse:  (!) 122 (!) 122   Resp:  (!) 23 (!) 26   Temp: 98.5 F (36.9 C) (!) 100.8 F (38.2 C)  99.6 F (37.6 C)  TempSrc: Oral Oral  Oral  SpO2:  99% 98%   PainSc:        Isolation Precautions No active isolations  Medications Medications  lactated ringers infusion ( Intravenous New Bag/Given 05/30/22 2258)  cefTRIAXone (ROCEPHIN) 2 g in sodium chloride 0.9 % 100 mL IVPB (has no administration in time range)  metroNIDAZOLE (FLAGYL) IVPB 500 mg (has no administration in time range)  sodium chloride 0.9 % bolus 1,000 mL (0 mLs Intravenous Stopped 05/30/22 1801)  fentaNYL (SUBLIMAZE) injection 50 mcg (50 mcg Intravenous Given 05/30/22 1655)  ondansetron (ZOFRAN) injection 4 mg (4 mg Intravenous Given 05/30/22 1658)  iohexol (OMNIPAQUE) 350 MG/ML injection 75 mL (75 mLs Intravenous Contrast Given 05/30/22 2023)  piperacillin-tazobactam (ZOSYN) IVPB 3.375 g (0 g Intravenous Stopped 05/30/22 2216)    Mobility walks with person assist     Focused Assessments Pt presenting with abd pain/swelling, and jaundice. Believed to have a gall bladder duct blockage. Pt is going for a stent.    R Recommendations: See Admitting Provider Note  Report given to:   Additional Notes:  Call or epic message for any additional questions

## 2022-05-30 NOTE — Assessment & Plan Note (Signed)
Stable

## 2022-05-31 DIAGNOSIS — K828 Other specified diseases of gallbladder: Secondary | ICD-10-CM | POA: Diagnosis present

## 2022-05-31 DIAGNOSIS — E785 Hyperlipidemia, unspecified: Secondary | ICD-10-CM | POA: Diagnosis present

## 2022-05-31 DIAGNOSIS — K766 Portal hypertension: Secondary | ICD-10-CM | POA: Diagnosis present

## 2022-05-31 DIAGNOSIS — R7881 Bacteremia: Secondary | ICD-10-CM | POA: Diagnosis present

## 2022-05-31 DIAGNOSIS — K5900 Constipation, unspecified: Secondary | ICD-10-CM | POA: Diagnosis present

## 2022-05-31 DIAGNOSIS — D696 Thrombocytopenia, unspecified: Secondary | ICD-10-CM | POA: Diagnosis present

## 2022-05-31 DIAGNOSIS — D890 Polyclonal hypergammaglobulinemia: Secondary | ICD-10-CM | POA: Diagnosis present

## 2022-05-31 DIAGNOSIS — N401 Enlarged prostate with lower urinary tract symptoms: Secondary | ICD-10-CM | POA: Diagnosis present

## 2022-05-31 DIAGNOSIS — B9620 Unspecified Escherichia coli [E. coli] as the cause of diseases classified elsewhere: Secondary | ICD-10-CM | POA: Diagnosis present

## 2022-05-31 DIAGNOSIS — R161 Splenomegaly, not elsewhere classified: Secondary | ICD-10-CM | POA: Diagnosis present

## 2022-05-31 DIAGNOSIS — R1011 Right upper quadrant pain: Secondary | ICD-10-CM | POA: Diagnosis not present

## 2022-05-31 DIAGNOSIS — I456 Pre-excitation syndrome: Secondary | ICD-10-CM | POA: Diagnosis present

## 2022-05-31 DIAGNOSIS — R109 Unspecified abdominal pain: Secondary | ICD-10-CM | POA: Diagnosis not present

## 2022-05-31 DIAGNOSIS — K7030 Alcoholic cirrhosis of liver without ascites: Secondary | ICD-10-CM | POA: Diagnosis present

## 2022-05-31 DIAGNOSIS — E1165 Type 2 diabetes mellitus with hyperglycemia: Secondary | ICD-10-CM | POA: Diagnosis present

## 2022-05-31 DIAGNOSIS — Z794 Long term (current) use of insulin: Secondary | ICD-10-CM | POA: Diagnosis not present

## 2022-05-31 DIAGNOSIS — E1151 Type 2 diabetes mellitus with diabetic peripheral angiopathy without gangrene: Secondary | ICD-10-CM | POA: Diagnosis present

## 2022-05-31 DIAGNOSIS — E1142 Type 2 diabetes mellitus with diabetic polyneuropathy: Secondary | ICD-10-CM | POA: Diagnosis present

## 2022-05-31 DIAGNOSIS — I480 Paroxysmal atrial fibrillation: Secondary | ICD-10-CM | POA: Diagnosis present

## 2022-05-31 DIAGNOSIS — E669 Obesity, unspecified: Secondary | ICD-10-CM | POA: Diagnosis present

## 2022-05-31 DIAGNOSIS — I10 Essential (primary) hypertension: Secondary | ICD-10-CM | POA: Diagnosis present

## 2022-05-31 DIAGNOSIS — K8020 Calculus of gallbladder without cholecystitis without obstruction: Secondary | ICD-10-CM | POA: Diagnosis not present

## 2022-05-31 DIAGNOSIS — R351 Nocturia: Secondary | ICD-10-CM | POA: Diagnosis present

## 2022-05-31 DIAGNOSIS — K8000 Calculus of gallbladder with acute cholecystitis without obstruction: Secondary | ICD-10-CM | POA: Diagnosis present

## 2022-05-31 DIAGNOSIS — J309 Allergic rhinitis, unspecified: Secondary | ICD-10-CM | POA: Diagnosis present

## 2022-05-31 DIAGNOSIS — K429 Umbilical hernia without obstruction or gangrene: Secondary | ICD-10-CM | POA: Diagnosis present

## 2022-05-31 DIAGNOSIS — C9110 Chronic lymphocytic leukemia of B-cell type not having achieved remission: Secondary | ICD-10-CM | POA: Diagnosis present

## 2022-05-31 DIAGNOSIS — Z8616 Personal history of COVID-19: Secondary | ICD-10-CM | POA: Diagnosis not present

## 2022-05-31 LAB — COMPREHENSIVE METABOLIC PANEL
ALT: 27 U/L (ref 0–44)
AST: 22 U/L (ref 15–41)
Albumin: 2.8 g/dL — ABNORMAL LOW (ref 3.5–5.0)
Alkaline Phosphatase: 101 U/L (ref 38–126)
Anion gap: 11 (ref 5–15)
BUN: 13 mg/dL (ref 8–23)
CO2: 21 mmol/L — ABNORMAL LOW (ref 22–32)
Calcium: 8.4 mg/dL — ABNORMAL LOW (ref 8.9–10.3)
Chloride: 99 mmol/L (ref 98–111)
Creatinine, Ser: 1.08 mg/dL (ref 0.61–1.24)
GFR, Estimated: >60 mL/min (ref >60)
Glucose, Bld: 290 mg/dL — ABNORMAL HIGH (ref 70–99)
Potassium: 3.6 mmol/L (ref 3.5–5.1)
Sodium: 131 mmol/L — ABNORMAL LOW (ref 135–145)
Total Bilirubin: 4.7 mg/dL — ABNORMAL HIGH (ref 0.3–1.2)
Total Protein: 6 g/dL — ABNORMAL LOW (ref 6.5–8.1)

## 2022-05-31 LAB — CBC WITH DIFFERENTIAL/PLATELET
Abs Immature Granulocytes: 0.08 10*3/uL — ABNORMAL HIGH (ref 0.00–0.07)
Basophils Absolute: 0 10*3/uL (ref 0.0–0.1)
Basophils Relative: 0 %
Eosinophils Absolute: 0 10*3/uL (ref 0.0–0.5)
Eosinophils Relative: 0 %
HCT: 37.1 % — ABNORMAL LOW (ref 39.0–52.0)
Hemoglobin: 13 g/dL (ref 13.0–17.0)
Immature Granulocytes: 1 %
Lymphocytes Relative: 23 %
Lymphs Abs: 2.2 10*3/uL (ref 0.7–4.0)
MCH: 32.6 pg (ref 26.0–34.0)
MCHC: 35 g/dL (ref 30.0–36.0)
MCV: 93 fL (ref 80.0–100.0)
Monocytes Absolute: 0.8 10*3/uL (ref 0.1–1.0)
Monocytes Relative: 8 %
Neutro Abs: 6.4 10*3/uL (ref 1.7–7.7)
Neutrophils Relative %: 68 %
Platelets: 73 10*3/uL — ABNORMAL LOW (ref 150–400)
RBC: 3.99 MIL/uL — ABNORMAL LOW (ref 4.22–5.81)
RDW: 12.3 % (ref 11.5–15.5)
WBC: 9.4 10*3/uL (ref 4.0–10.5)
nRBC: 0 % (ref 0.0–0.2)

## 2022-05-31 LAB — BLOOD CULTURE ID PANEL (REFLEXED) - BCID2

## 2022-05-31 LAB — URINE CULTURE: Culture: NO GROWTH

## 2022-05-31 LAB — GLUCOSE, CAPILLARY
Glucose-Capillary: 288 mg/dL — ABNORMAL HIGH (ref 70–99)
Glucose-Capillary: 244 mg/dL — ABNORMAL HIGH (ref 70–99)
Glucose-Capillary: 227 mg/dL — ABNORMAL HIGH (ref 70–99)
Glucose-Capillary: 305 mg/dL — ABNORMAL HIGH (ref 70–99)
Glucose-Capillary: 264 mg/dL — ABNORMAL HIGH (ref 70–99)

## 2022-05-31 LAB — MAGNESIUM: Magnesium: 1.2 mg/dL — ABNORMAL LOW (ref 1.7–2.4)

## 2022-05-31 LAB — CULTURE, BLOOD (ROUTINE X 2)

## 2022-05-31 LAB — PROCALCITONIN: Procalcitonin: 6.96 ng/mL

## 2022-05-31 MED ORDER — MORPHINE SULFATE (PF) 2 MG/ML IV SOLN
2.0000 mg | INTRAVENOUS | Status: DC | PRN
  Administered 2022-05-31 – 2022-06-01 (×7): 2 mg via INTRAVENOUS
  Filled 2022-05-31 (×8): qty 1

## 2022-05-31 MED ORDER — ACETAMINOPHEN 650 MG RE SUPP: 650.0000 mg | Freq: Four times a day (QID) | RECTAL | Status: DC | PRN

## 2022-05-31 MED ORDER — INSULIN GLARGINE-YFGN 100 UNIT/ML ~~LOC~~ SOLN
20.0000 [IU] | Freq: Two times a day (BID) | SUBCUTANEOUS | Status: DC
  Administered 2022-05-31 (×2): 20 [IU] via SUBCUTANEOUS
  Filled 2022-05-31 (×4): qty 0.2

## 2022-05-31 MED ORDER — INSULIN ASPART 100 UNIT/ML IJ SOLN
0.0000 [IU] | Freq: Every day | INTRAMUSCULAR | Status: DC
  Administered 2022-05-31 (×2): 3 [IU] via SUBCUTANEOUS
  Administered 2022-06-01: 4 [IU] via SUBCUTANEOUS
  Administered 2022-06-02: 2 [IU] via SUBCUTANEOUS

## 2022-05-31 MED ORDER — ACETAMINOPHEN 325 MG PO TABS
650.0000 mg | ORAL_TABLET | Freq: Four times a day (QID) | ORAL | Status: DC | PRN
  Administered 2022-05-31 – 2022-06-02 (×2): 650 mg via ORAL
  Filled 2022-05-31 (×2): qty 2

## 2022-05-31 MED ORDER — ONDANSETRON HCL 4 MG/2ML IJ SOLN: 4.0000 mg | Freq: Four times a day (QID) | INTRAMUSCULAR | Status: DC | PRN

## 2022-05-31 MED ORDER — ONDANSETRON HCL 4 MG PO TABS: 4.0000 mg | ORAL_TABLET | Freq: Four times a day (QID) | ORAL | Status: DC | PRN

## 2022-05-31 MED ORDER — INSULIN ASPART 100 UNIT/ML IJ SOLN
0.0000 [IU] | Freq: Three times a day (TID) | INTRAMUSCULAR | Status: DC
  Administered 2022-05-31: 5 [IU] via SUBCUTANEOUS
  Administered 2022-05-31: 11 [IU] via SUBCUTANEOUS
  Administered 2022-05-31: 5 [IU] via SUBCUTANEOUS
  Administered 2022-06-01 (×3): 8 [IU] via SUBCUTANEOUS
  Administered 2022-06-02: 3 [IU] via SUBCUTANEOUS
  Administered 2022-06-02: 5 [IU] via SUBCUTANEOUS
  Administered 2022-06-02: 3 [IU] via SUBCUTANEOUS
  Administered 2022-06-03: 11 [IU] via SUBCUTANEOUS
  Administered 2022-06-03: 3 [IU] via SUBCUTANEOUS

## 2022-05-31 MED ORDER — PANTOPRAZOLE SODIUM 40 MG PO TBEC
40.0000 mg | DELAYED_RELEASE_TABLET | Freq: Two times a day (BID) | ORAL | Status: DC
  Administered 2022-05-31 – 2022-06-03 (×8): 40 mg via ORAL
  Filled 2022-05-31 (×8): qty 1

## 2022-05-31 MED ORDER — GABAPENTIN 300 MG PO CAPS
300.0000 mg | ORAL_CAPSULE | Freq: Three times a day (TID) | ORAL | Status: DC
  Administered 2022-05-31 – 2022-06-03 (×10): 300 mg via ORAL
  Filled 2022-05-31 (×7): qty 1
  Filled 2022-05-31: qty 3
  Filled 2022-05-31 (×2): qty 1

## 2022-05-31 MED ORDER — MAGNESIUM SULFATE 4 GM/100ML IV SOLN
4.0000 g | Freq: Once | INTRAVENOUS | Status: AC
  Administered 2022-05-31: 4 g via INTRAVENOUS
  Filled 2022-05-31 (×2): qty 100

## 2022-05-31 MED ORDER — INSULIN GLARGINE-YFGN 100 UNIT/ML ~~LOC~~ SOLN
25.0000 [IU] | Freq: Every day | SUBCUTANEOUS | Status: DC
  Administered 2022-05-31: 25 [IU] via SUBCUTANEOUS
  Filled 2022-05-31 (×4): qty 0.25

## 2022-05-31 NOTE — Progress Notes (Signed)
PHARMACY - PHYSICIAN COMMUNICATION CRITICAL VALUE ALERT - BLOOD CULTURE IDENTIFICATION (BCID)  Patrick Kidd is an 77 y.o. male who presented to [facility] on 05/30/2022 with a chief complaint of abdominal pain and fever.   Assessment:  Current abx ceftriaxone 2gm q24h, metronidazole 500mg  q12hr BCID with ecoli no resistance - current therapy appropriate   Name of physician (or Provider) Contacted: [name]   [name] Pharm.D. CPP, BCPS Clinical Pharmacist [phone] 05/31/2022 12:31 PM    Results for orders placed or performed during the hospital encounter of 05/30/22  Blood Culture ID Panel (Reflexed) (Collected: 05/30/2022  9:20 PM)  Result Value Ref Range   Enterococcus faecalis NOT DETECTED NOT DETECTED   Enterococcus Faecium NOT DETECTED NOT DETECTED   Listeria monocytogenes NOT DETECTED NOT DETECTED   Staphylococcus species NOT DETECTED NOT DETECTED   Staphylococcus aureus (BCID) NOT DETECTED NOT DETECTED   Staphylococcus epidermidis NOT DETECTED NOT DETECTED   Staphylococcus lugdunensis NOT DETECTED NOT DETECTED   Streptococcus species NOT DETECTED NOT DETECTED   Streptococcus agalactiae NOT DETECTED NOT DETECTED   Streptococcus pneumoniae NOT DETECTED NOT DETECTED   Streptococcus pyogenes NOT DETECTED NOT DETECTED   A.calcoaceticus-baumannii NOT DETECTED NOT DETECTED   Bacteroides fragilis NOT DETECTED NOT DETECTED   Enterobacterales DETECTED (A) NOT DETECTED   Enterobacter cloacae complex NOT DETECTED NOT DETECTED   Escherichia coli DETECTED (A) NOT DETECTED   Klebsiella aerogenes NOT DETECTED NOT DETECTED   Klebsiella oxytoca NOT DETECTED NOT DETECTED   Klebsiella pneumoniae NOT DETECTED NOT DETECTED   Proteus species NOT DETECTED NOT DETECTED   Salmonella species NOT DETECTED NOT DETECTED   Serratia marcescens NOT DETECTED NOT DETECTED   Haemophilus influenzae NOT DETECTED NOT DETECTED   Neisseria meningitidis NOT DETECTED NOT DETECTED   Pseudomonas  aeruginosa NOT DETECTED NOT DETECTED   Stenotrophomonas maltophilia NOT DETECTED NOT DETECTED   Candida albicans NOT DETECTED NOT DETECTED   Candida auris NOT DETECTED NOT DETECTED   Candida glabrata NOT DETECTED NOT DETECTED   Candida krusei NOT DETECTED NOT DETECTED   Candida parapsilosis NOT DETECTED NOT DETECTED   Candida tropicalis NOT DETECTED NOT DETECTED   Cryptococcus neoformans/gattii NOT DETECTED NOT DETECTED   CTX-M ESBL NOT DETECTED NOT DETECTED   Carbapenem resistance IMP NOT DETECTED NOT DETECTED   Carbapenem resistance KPC NOT DETECTED NOT DETECTED   Carbapenem resistance NDM NOT DETECTED NOT DETECTED   Carbapenem resist OXA 48 LIKE NOT DETECTED NOT DETECTED   Carbapenem resistance VIM NOT DETECTED NOT DETECTED

## 2022-05-31 NOTE — Progress Notes (Signed)
PROGRESS NOTE    Patrick Kidd  ZOX:[id number] DOB: Jan 27, 1946 DOA: 05/30/2022 PCP: Etta Grandchild, MD   Brief Narrative:  77 year old male history of alcoholic cirrhosis, CLL, diabetes, hypertension, peripheral neuropathy, morbid obesity, history of Wolff-Parkinson-White syndrome, prior history of prostate cancer, presents to the ER today with 5-day history of feeling poorly.  He started having vomiting x 1 on Tuesday.  Began having abdominal pain the same day.  Has noted dark-colored urine for the last 3 to 4 days.  He had 4 loose stools today.  He had a fever on Monday night.  He has had low-grade temperatures the rest of the week.  Was seen in urgent care today.  Had labs drawn.  Bilirubin came back elevated at 5.6.  It had been 2.2 about 1 month ago.  He was referred to the ER for evaluation.   On arrival temp 98.3 but increased to 100.8 heart rate 98 blood pressure 198/83.  Heart rate is increased to 122.   White count 8.8, hemoglobin 14.3, platelets 81 Sodium 132, potassium 4.3, chloride 94, bicarb 24, BUN of 15, creatinine  1.05, glucose of 317, total protein 6.9, albumin 3.3, AST 34, ALT 36, alk phos 121, total bili 5.6   INR 1.2   CT abdomen pelvis demonstrated high layer density material in the dependent portion of the gallbladder.  Mild wall thickening.  Mild fat stranding adjacent to the gallbladder.  No dilatation of the bile ducts.  Liver is cirrhotic.   EDP discussed the case Dr. Sophronia Simas with surgery.  Dr. Freida Busman recommended admission to the hospital to the hospitalist.  And to order a MRCP if the patient's LFTs continue to rise.  Surgery will see the patient in the morning.   Assessment & Plan:   Principal Problem:   Sludge in gallbladder Active Problems:   Hyperbilirubinemia   Type 2 diabetes mellitus with complication, with long-term current use of insulin (HCC)   Essential hypertension   WPW (Wolff-Parkinson-White syndrome)   Stage 3a chronic kidney disease  (HCC)   Chronic lymphocytic leukemia (HCC)   Peripheral neuropathy  Sludge in gallbladder Patient complains of right upper quadrant pain but he says that he believes he was jostled when he was moved from 1 bed to another.  Has right upper quadrant tenderness.  Also had low-grade fever of 100.8 last night.  No leukocytosis.  Discussing with general surgery/Dr. Dossie Der, he is not impressed and convinced that this is acute cholecystitis however he recommended continuing antibiotics for now and reassessment tomorrow morning.     Hyperbilirubinemia May be due to cirrhosis.  Bilirubin improving.  LFTs normal.  No plan for MRCP.  General surgery recommends keeping overnight and repeating labs in the morning.   Chronic lymphocytic leukemia (HCC) Chronic. Follows with heme/onc. Has low platelets chronically. SCDs for prophylaxis.   Stage 3a chronic kidney disease (HCC) ruled out.  Patient's creatinine is normal.   WPW (Wolff-Parkinson-White syndrome) Avoid betablockers and non-dihydropyridine calcium channel blockers(cardizem, verpamil). Pt states he "outgrew" his WPW in his early 20-30s.    Essential hypertension Stable.  He is not on any medications.   Type 2 diabetes mellitus with complication, with long-term current use of insulin (HCC) Appears to be taking Lantus 50 units nightly and metformin at home.  Has been started on 25 units of Semglee nightly with SSI.  Hyperglycemic.  Will increase this to 20 units twice daily semeglee.    Peripheral neuropathy Continue neurontin  DVT prophylaxis: SCDs  Start: 05/31/22 0009   Code Status: Full Code  Family Communication:  None present at bedside.  Plan of care discussed with patient in length and he/she verbalized understanding and agreed with it.  Status is: Observation The patient will require care spanning > 2 midnights and should be moved to inpatient because: General surgery recommends observing overnight and repeating labs in the  morning.   Estimated body mass index is 29.83 kg/m as calculated from the following:   Height as of this encounter: 6\' 2"  (1.88 m).   Weight as of this encounter: 105.4 kg.    Nutritional Assessment: Body mass index is 29.83 kg/m.Marland Kitchen Seen by dietician.  I agree with the assessment and plan as outlined below: Nutrition Status:        . Skin Assessment: I have examined the patient's skin and I agree with the wound assessment as performed by the wound care RN as outlined below:    Consultants:  General surgery.  Procedures:  None  Antimicrobials:  Anti-infectives (From admission, onward)    Start     Dose/Rate Route Frequency Ordered Stop   05/31/22 0200  cefTRIAXone (ROCEPHIN) 2 g in sodium chloride 0.9 % 100 mL IVPB        2 g 200 mL/hr over 30 Minutes Intravenous Every 24 hours 05/30/22 2242     05/31/22 0200  metroNIDAZOLE (FLAGYL) IVPB 500 mg        500 mg 100 mL/hr over 60 Minutes Intravenous Every 12 hours 05/30/22 2242     05/30/22 2115  piperacillin-tazobactam (ZOSYN) IVPB 3.375 g        3.375 g 100 mL/hr over 30 Minutes Intravenous  Once 05/30/22 2106 05/30/22 2216         Subjective: Seen and examined.  Complains of right upper quadrant pain.  Details as above.  Also says that he is starving and wants to eat.  Advancing to low-fat diet as recommended by general surgery.  Objective: Vitals:   05/30/22 2256 05/31/22 0008 05/31/22 0400 05/31/22 0729  BP:  (!) 155/81 (!) 155/79 (!) 146/67  Pulse:  96 99 (!) 104  Resp:  (!) 22 18 20   Temp: 99.6 F (37.6 C) 98.5 F (36.9 C) 99.5 F (37.5 C) 98.7 F (37.1 C)  TempSrc: Oral Oral Oral Oral  SpO2:  96% 97% 98%  Weight:  105.4 kg    Height:  6\' 2"  (1.88 m)      Intake/Output Summary (Last 24 hours) at 05/31/2022 1024 Last data filed at 05/31/2022 0932 Gross per 24 hour  Intake 1270 ml  Output --  Net 1270 ml   Filed Weights   05/31/22 0008  Weight: 105.4 kg    Examination:  General exam:  Appears calm and comfortable  Respiratory system: Clear to auscultation. Respiratory effort normal. Cardiovascular system: S1 & S2 heard, RRR. No JVD, murmurs, rubs, gallops or clicks. No pedal edema. Gastrointestinal system: Abdomen is nondistended, soft and tender at right upper quadrant.  Positive/normal bowel sounds. Central nervous system: Alert and oriented. No focal neurological deficits. Extremities: Symmetric 5 x 5 power. Skin: No rashes, lesions or ulcers Psychiatry: Judgement and insight appear normal. Mood & affect appropriate.    Data Reviewed: I have personally reviewed following labs and imaging studies  CBC: Recent Labs  Lab 05/30/22 1251 05/31/22 0109  WBC 8.8 9.4  NEUTROABS 5.8 6.4  HGB 14.3 13.0  HCT 41.2 37.1*  MCV 95.6 93.0  PLT 81* 73*   Basic  Metabolic Panel: Recent Labs  Lab 05/30/22 1251 05/31/22 0109  NA 132* 131*  K 4.3 3.6  CL 94* 99  CO2 24 21*  GLUCOSE 317* 290*  BUN 15 13  CREATININE 1.05 1.08  CALCIUM 8.8* 8.4*  MG  --  1.2*   GFR: Estimated Creatinine Clearance: 74.1 mL/min (by C-G formula based on SCr of 1.08 mg/dL). Liver Function Tests: Recent Labs  Lab 05/30/22 1251 05/31/22 0109  AST 32 22  ALT 36 27  ALKPHOS 121 101  BILITOT 5.6* 4.7*  PROT 6.9 6.0*  ALBUMIN 3.3* 2.8*   Recent Labs  Lab 05/30/22 1251  LIPASE 30   No results for input(s): "AMMONIA" in the last 168 hours. Coagulation Profile: Recent Labs  Lab 05/30/22 1717  INR 1.2   Cardiac Enzymes: No results for input(s): "CKTOTAL", "CKMB", "CKMBINDEX", "TROPONINI" in the last 168 hours. BNP (last 3 results) No results for input(s): "PROBNP" in the last 8760 hours. HbA1C: No results for input(s): "HGBA1C" in the last 72 hours. CBG: Recent Labs  Lab 05/31/22 0034 05/31/22 0727  GLUCAP 288* 244*   Lipid Profile: No results for input(s): "CHOL", "HDL", "LDLCALC", "TRIG", "CHOLHDL", "LDLDIRECT" in the last 72 hours. Thyroid Function Tests: No results  for input(s): "TSH", "T4TOTAL", "FREET4", "T3FREE", "THYROIDAB" in the last 72 hours. Anemia Panel: No results for input(s): "VITAMINB12", "FOLATE", "FERRITIN", "TIBC", "IRON", "RETICCTPCT" in the last 72 hours. Sepsis Labs: Recent Labs  Lab 05/30/22 2124 05/31/22 0109  PROCALCITON  --  6.96  LATICACIDVEN 1.7  --     Recent Results (from the past 240 hour(s))  Urine Culture     Status: None   Collection Time: 05/30/22 12:39 PM   Specimen: Urine, Clean Catch  Result Value Ref Range Status   Specimen Description URINE, CLEAN CATCH  Final   Special Requests NONE  Final   Culture   Final    NO GROWTH Performed at [facility], [address] [place], [place] [address]    Report Status 05/31/2022 FINAL  Final  Blood culture (routine x 2)     Status: None (Preliminary result)   Collection Time: 05/30/22  9:20 PM   Specimen: BLOOD RIGHT FOREARM  Result Value Ref Range Status   Specimen Description BLOOD RIGHT FOREARM  Final   Special Requests   Final    BOTTLES DRAWN AEROBIC AND ANAEROBIC Blood Culture adequate volume   Culture  Setup Time   Final    ANAEROBIC BOTTLE ONLY GRAM NEGATIVE RODS Organism ID to follow    Culture   Final    NO GROWTH < 12 HOURS Performed at [facility], [address], [place], [place] [address]    Report Status PENDING  Incomplete  Blood culture (routine x 2)     Status: None (Preliminary result)   Collection Time: 05/30/22  9:28 PM   Specimen: BLOOD LEFT FOREARM  Result Value Ref Range Status   Specimen Description BLOOD LEFT FOREARM  Final   Special Requests   Final    BOTTLES DRAWN AEROBIC AND ANAEROBIC Blood Culture results may not be optimal due to an excessive volume of blood received in culture bottles   Culture   Final    NO GROWTH < 12 HOURS Performed at [facility], [address] [place], [place] [address]    Report Status PENDING  Incomplete     Radiology Studies: US Abdomen Limited RUQ (LIVER/GB)  Result  Date: 05/30/2022 CLINICAL  DATA:  Elevated bilirubin EXAM: ULTRASOUND ABDOMEN LIMITED RIGHT UPPER QUADRANT COMPARISON:  CT from earlier in the same day, ultrasound from 02/25/2022. FINDINGS: Gallbladder: Gallbladder is well distended with multiple stones and gallbladder sludge within. Mild wall thickening at 4 mm is noted. No pericholecystic fluid is seen. Negative sonographic Murphy's sign is elicited. Common bile duct: Diameter: 4 mm Liver: 6.4 cm cyst is noted in the right lobe of the liver similar to that seen on the prior exam. Some dependent density is noted within which may be related to prior hemorrhage. No other focal abnormality is noted in the liver. Portal vein is patent on color Doppler imaging with normal direction of blood flow towards the liver. Other: None. IMPRESSION: Gallstones and gallbladder sludge with mild wall thickening. Negative sonographic Murphy's sign is elicited. These changes are stable from the prior ultrasound exam. Hepatic cyst similar to that seen on recent CT examination. Dependent density is noted within the overall appearance is similar to that seen on prior exam from January of 2024. Electronically Signed   By: Alcide Clever M.D.   On: 05/30/2022 23:01   CT ABDOMEN PELVIS W CONTRAST  Result Date: 05/30/2022 CLINICAL DATA:  Abdominal pain, vomiting, constipation EXAM: CT ABDOMEN AND PELVIS WITH CONTRAST TECHNIQUE: Multidetector CT imaging of the abdomen and pelvis was performed using the standard protocol following bolus administration of intravenous contrast. RADIATION DOSE REDUCTION: This exam was performed according to the departmental dose-optimization program which includes automated exposure control, adjustment of the mA and/or kV according to patient size and/or use of iterative reconstruction technique. CONTRAST:  75mL OMNIPAQUE IOHEXOL 350 MG/ML SOLN COMPARISON:  01/28/2019 FINDINGS: Lower chest: Small linear densities are seen in lower lung fields suggesting scarring  or subsegmental atelectasis. Coronary artery calcifications are seen. Hepatobiliary: There is nodularity in the liver surface. There is 6.4 cm smooth marginated fluid density lesion in the right lobe suggesting cyst. There is layering high density in the dependent portion of gallbladder lumen. There is mild wall thickening in gallbladder. There is stranding in the fat adjacent to the gallbladder. There is no dilation of bile ducts. Pancreas: No focal abnormalities are seen in pancreas. Diverticulum is noted along the inner margin of second portion of duodenum. Spleen: Spleen measures 15.3 cm in maximum diameter. Adrenals/Urinary Tract: Adrenals are unremarkable. There is no hydronephrosis. There is possible 1 mm right renal calculus. Ureters are not dilated. Urinary bladder is not distended. There is mild diffuse wall thickening in the bladder. Stomach/Bowel: Stomach is not distended. Small bowel loops are not dilated. Appendix is not dilated. Scattered diverticula are seen in colon. There is no evidence of focal acute diverticulitis. Vascular/Lymphatic: Scattered arterial calcifications are seen. There are scattered subcentimeter nodes suggesting reactive hyperplasia. Reproductive: Metallic densities are seen in prostate suggesting previous brachytherapy. Other: There is no ascites or pneumoperitoneum. Small bilateral inguinal hernias containing fat are seen. Musculoskeletal: Degenerative changes are noted in thoracic and lumbar spine with bony spurs. IMPRESSION: There is no evidence of intestinal obstruction or pneumoperitoneum. There is no hydronephrosis. Appendix is not dilated. Gallbladder is distended. There is layering of high density in the dependent portion of gallbladder suggesting presence of sludge and stones. There is mild pericholecystic stranding. Possibility of acute cholecystitis is not excluded. If clinically warranted, follow-up gallbladder sonogram may be considered. There is no dilation of bile  ducts. Coronary artery disease. There is a 6.4 cm hepatic cyst. Cirrhosis of liver. Splenomegaly. There is tiny nonobstructing right renal calculus. Scattered diverticula are  seen in colon without signs of focal diverticulitis. There is mild diffuse wall thickening in the urinary bladder which may be due to incomplete distention or suggest cystitis. Electronically Signed   By: Ernie Avena M.D.   On: 05/30/2022 20:43   DG Abd 2 Views  Result Date: 05/30/2022 CLINICAL DATA:  Constipation, abdominal pain. EXAM: ABDOMEN - 2 VIEW COMPARISON:  Abdominal radiographs 02/02/2019. Abdominal MRI 08/14/2020. FINDINGS: Supine and erect views (4 total) are submitted. There is a normal, nonobstructive bowel gas pattern. Colonic stool burden does not appear increased. No evidence of pneumoperitoneum, bowel wall thickening or suspicious abdominal calcification. Prostate brachytherapy seeds are noted. There are mild degenerative changes in the spine. IMPRESSION: No evidence of acute abdominal process. Electronically Signed   By: Carey Bullocks M.D.   On: 05/30/2022 11:32    Scheduled Meds:  gabapentin  300 mg Oral TID   insulin aspart  0-15 Units Subcutaneous TID WC   insulin aspart  0-5 Units Subcutaneous QHS   insulin glargine-yfgn  20 Units Subcutaneous BID   pantoprazole  40 mg Oral BID   Continuous Infusions:  cefTRIAXone (ROCEPHIN)  IV 2 g (05/31/22 0036)   lactated ringers Stopped (05/31/22 0900)   magnesium sulfate bolus IVPB 4 g (05/31/22 0931)   metronidazole 500 mg (05/31/22 0152)     LOS: 0 days   Hughie Closs, MD [facility]  05/31/2022, 10:24 AM   *Please note that this is a verbal dictation therefore any spelling or grammatical errors are due to the "Dragon Medical One" system interpretation.  Please page via Amion and do not message via secure chat for urgent patient care matters. Secure chat can be used for non urgent patient care matters.  How to contact the [facility]  Attending or Consulting provider 7A - 7P or covering provider during after hours 7P -7A, for this patient?  Check the care team in [facility] and look for a) attending/consulting TR[facility] provider listed and b) the [facility] team listed. Page or secure chat 7A-7P. Log into [email or web address] and use [facility] universal password to access. If you do not have the password, please contact the hospital operator. Locate the [facility] provider you are looking for under [facility] and page to a number that you can be directly reached. If you still have difficulty reaching the provider, please page the [facility] (Director on Call) for the Hospitalists listed on amion for assistance.

## 2022-05-31 NOTE — Plan of Care (Signed)

## 2022-05-31 NOTE — Progress Notes (Signed)
Progress Note: General Surgery Service   Chief Complaint/Subjective: Still has some pain around right rib, but this wasn't really his presenting issue.    Objective: Vital signs in last 24 hours: Temp:  [98.2 F (36.8 C)-100.8 F (38.2 C)] 98.7 F (37.1 C) (04/27 0729) Pulse Rate:  [85-135] 104 (04/27 0729) Resp:  [18-29] 20 (04/27 0729) BP: (140-198)/(67-108) 146/67 (04/27 0729) SpO2:  [96 %-100 %] 98 % (04/27 0729) Weight:  [105.4 kg] 105.4 kg (04/27 0008) Last BM Date : 05/30/22  Intake/Output from previous day: 04/26 0701 - 04/27 0700 In: 1150 [IV Piggyback:1150] Out: -  Intake/Output this shift: No intake/output data recorded.  Constitutional: NAD; conversant; no deformities Eyes: Moist conjunctiva; no lid lag; anicteric; PERRL Neck: Trachea midline; no thyromegaly Lungs: Normal respiratory effort; no tactile fremitus CV: RRR; no palpable thrills; no pitting edema GI: Abd tenderness RUQ over costal margin MSK: Normal range of motion of extremities; no clubbing/cyanosis Psychiatric: Appropriate affect; alert and oriented x3 Lymphatic: No palpable cervical or axillary lymphadenopathy  Lab Results: CBC  Recent Labs    05/30/22 1251 05/31/22 0109  WBC 8.8 9.4  HGB 14.3 13.0  HCT 41.2 37.1*  PLT 81* 73*   BMET Recent Labs    05/30/22 1251 05/31/22 0109  NA 132* 131*  K 4.3 3.6  CL 94* 99  CO2 24 21*  GLUCOSE 317* 290*  BUN 15 13  CREATININE 1.05 1.08  CALCIUM 8.8* 8.4*   PT/INR Recent Labs    05/30/22 1717  LABPROT 14.7  INR 1.2   ABG No results for input(s): "PHART", "HCO3" in the last 72 hours.  Invalid input(s): "PCO2", "PO2"  Anti-infectives: Anti-infectives (From admission, onward)    Start     Dose/Rate Route Frequency Ordered Stop   05/31/22 0200  cefTRIAXone (ROCEPHIN) 2 g in sodium chloride 0.9 % 100 mL IVPB        2 g 200 mL/hr over 30 Minutes Intravenous Every 24 hours 05/30/22 2242     05/31/22 0200  metroNIDAZOLE (FLAGYL)  IVPB 500 mg        500 mg 100 mL/hr over 60 Minutes Intravenous Every 12 hours 05/30/22 2242     05/30/22 2115  piperacillin-tazobactam (ZOSYN) IVPB 3.375 g        3.375 g 100 mL/hr over 30 Minutes Intravenous  Once 05/30/22 2106 05/30/22 2216       Medications: Scheduled Meds:  gabapentin  300 mg Oral TID   insulin aspart  0-15 Units Subcutaneous TID WC   insulin aspart  0-5 Units Subcutaneous QHS   insulin glargine-yfgn  20 Units Subcutaneous BID   pantoprazole  40 mg Oral BID   Continuous Infusions:  cefTRIAXone (ROCEPHIN)  IV 2 g (05/31/22 0036)   lactated ringers Stopped (05/31/22 0900)   magnesium sulfate bolus IVPB     metronidazole 500 mg (05/31/22 0152)   PRN Meds:.acetaminophen **OR** acetaminophen, morphine injection, ondansetron **OR** ondansetron (ZOFRAN) IV  Assessment/Plan: Mr. Lacko is a 77 year old with cirrhosis who presented with concern for cholecystitis and hyperbilirubinemia.  WBC normal today Symptoms improving, patient hungry Still tender over RUQ - but says this pain started as he was jostled from one bed to another I would try a low fat diet today and recheck his LFTs in the morning including a direct and indirect bilirubin.   Depending on his labs and tolerance of diet he may be able to go tomorrow. Discussed with Dr. Jacqulyn Bath   LOS: 0 days  Quentin Ore, MD  [facility] Use [email or web address] to contact on call provider  Daily Billing: [address] - Moderate MDM

## 2022-06-01 DIAGNOSIS — K828 Other specified diseases of gallbladder: Secondary | ICD-10-CM | POA: Diagnosis not present

## 2022-06-01 DIAGNOSIS — R7881 Bacteremia: Secondary | ICD-10-CM | POA: Insufficient documentation

## 2022-06-01 DIAGNOSIS — B9620 Unspecified Escherichia coli [E. coli] as the cause of diseases classified elsewhere: Secondary | ICD-10-CM | POA: Insufficient documentation

## 2022-06-01 LAB — GLUCOSE, CAPILLARY
Glucose-Capillary: 253 mg/dL — ABNORMAL HIGH (ref 70–99)
Glucose-Capillary: 299 mg/dL — ABNORMAL HIGH (ref 70–99)
Glucose-Capillary: 258 mg/dL — ABNORMAL HIGH (ref 70–99)
Glucose-Capillary: 327 mg/dL — ABNORMAL HIGH (ref 70–99)

## 2022-06-01 LAB — HEPATIC FUNCTION PANEL
ALT: 19 U/L (ref 0–44)
AST: 15 U/L (ref 15–41)
Albumin: 2.6 g/dL — ABNORMAL LOW (ref 3.5–5.0)
Alkaline Phosphatase: 93 U/L (ref 38–126)
Bilirubin, Direct: 1.6 mg/dL — ABNORMAL HIGH (ref 0.0–0.2)
Indirect Bilirubin: 1.3 mg/dL — ABNORMAL HIGH (ref 0.3–0.9)
Total Bilirubin: 2.9 mg/dL — ABNORMAL HIGH (ref 0.3–1.2)
Total Protein: 5.9 g/dL — ABNORMAL LOW (ref 6.5–8.1)

## 2022-06-01 LAB — BASIC METABOLIC PANEL WITH GFR
Anion gap: 8 (ref 5–15)
BUN: 18 mg/dL (ref 8–23)
CO2: 24 mmol/L (ref 22–32)
Calcium: 7.9 mg/dL — ABNORMAL LOW (ref 8.9–10.3)
Chloride: 95 mmol/L — ABNORMAL LOW (ref 98–111)
Creatinine, Ser: 1.04 mg/dL (ref 0.61–1.24)
GFR, Estimated: >60 mL/min
Glucose, Bld: 316 mg/dL — ABNORMAL HIGH (ref 70–99)
Potassium: 3.4 mmol/L — ABNORMAL LOW (ref 3.5–5.1)
Sodium: 127 mmol/L — ABNORMAL LOW (ref 135–145)

## 2022-06-01 LAB — CBC WITH DIFFERENTIAL/PLATELET
Abs Immature Granulocytes: 0.04 K/uL (ref 0.00–0.07)
Basophils Absolute: 0 K/uL (ref 0.0–0.1)
Basophils Relative: 0 %
Eosinophils Absolute: 0 K/uL (ref 0.0–0.5)
Eosinophils Relative: 0 %
HCT: 35.6 % — ABNORMAL LOW (ref 39.0–52.0)
Hemoglobin: 12.6 g/dL — ABNORMAL LOW (ref 13.0–17.0)
Immature Granulocytes: 0 %
Lymphocytes Relative: 23 %
Lymphs Abs: 2.2 K/uL (ref 0.7–4.0)
MCH: 32.9 pg (ref 26.0–34.0)
MCHC: 35.4 g/dL (ref 30.0–36.0)
MCV: 93 fL (ref 80.0–100.0)
Monocytes Absolute: 0.8 K/uL (ref 0.1–1.0)
Monocytes Relative: 8 %
Neutro Abs: 6.6 K/uL (ref 1.7–7.7)
Neutrophils Relative %: 69 %
Platelets: 77 K/uL — ABNORMAL LOW (ref 150–400)
RBC: 3.83 MIL/uL — ABNORMAL LOW (ref 4.22–5.81)
RDW: 12.8 % (ref 11.5–15.5)
WBC: 9.7 K/uL (ref 4.0–10.5)
nRBC: 0 % (ref 0.0–0.2)

## 2022-06-01 MED ORDER — INSULIN GLARGINE-YFGN 100 UNIT/ML ~~LOC~~ SOLN
27.0000 [IU] | Freq: Two times a day (BID) | SUBCUTANEOUS | Status: DC
  Administered 2022-06-01 – 2022-06-03 (×5): 27 [IU] via SUBCUTANEOUS
  Filled 2022-06-01 (×6): qty 0.27

## 2022-06-01 MED ORDER — POTASSIUM CHLORIDE CRYS ER 20 MEQ PO TBCR
40.0000 meq | EXTENDED_RELEASE_TABLET | Freq: Once | ORAL | Status: AC
  Administered 2022-06-01: 40 meq via ORAL
  Filled 2022-06-01: qty 2

## 2022-06-01 NOTE — Progress Notes (Signed)
Progress Note: General Surgery Service   Chief Complaint/Subjective: Pain and symptoms improving.  He feels like he could go play some golf.    Objective: Vital signs in last 24 hours: Temp:  [98.1 F (36.7 C)-99.5 F (37.5 C)] 98.5 F (36.9 C) (04/28 0738) Pulse Rate:  [99-119] 110 (04/28 0738) Resp:  [18-20] 20 (04/28 0738) BP: (142-149)/(61-72) 142/63 (04/28 0738) SpO2:  [96 %-98 %] 96 % (04/28 0738) Last BM Date : 05/30/22  Intake/Output from previous day: 04/27 0701 - 04/28 0700 In: 520 [P.O.:120; IV Piggyback:400] Out: -  Intake/Output this shift: No intake/output data recorded.  Constitutional: NAD; conversant; no deformities Eyes: Moist conjunctiva; no lid lag; anicteric; PERRL Neck: Trachea midline; no thyromegaly Lungs: Normal respiratory effort; no tactile fremitus CV: RRR; no palpable thrills; no pitting edema GI: Abd tenderness RUQ over costal margin MSK: Normal range of motion of extremities; no clubbing/cyanosis Psychiatric: Appropriate affect; alert and oriented x3 Lymphatic: No palpable cervical or axillary lymphadenopathy  Lab Results: CBC  Recent Labs    05/31/22 0109 06/01/22 0149  WBC 9.4 9.7  HGB 13.0 12.6*  HCT 37.1* 35.6*  PLT 73* 77*    BMET Recent Labs    05/31/22 0109 06/01/22 0149  NA 131* 127*  K 3.6 3.4*  CL 99 95*  CO2 21* 24  GLUCOSE 290* 316*  BUN 13 18  CREATININE 1.08 1.04  CALCIUM 8.4* 7.9*    PT/INR Recent Labs    05/30/22 1717  LABPROT 14.7  INR 1.2    ABG No results for input(s): "PHART", "HCO3" in the last 72 hours.  Invalid input(s): "PCO2", "PO2"  Anti-infectives: Anti-infectives (From admission, onward)    Start     Dose/Rate Route Frequency Ordered Stop   05/31/22 0200  cefTRIAXone (ROCEPHIN) 2 g in sodium chloride 0.9 % 100 mL IVPB        2 g 200 mL/hr over 30 Minutes Intravenous Every 24 hours 05/30/22 2242     05/31/22 0200  metroNIDAZOLE (FLAGYL) IVPB 500 mg        500 mg 100 mL/hr over  60 Minutes Intravenous Every 12 hours 05/30/22 2242     05/30/22 2115  piperacillin-tazobactam (ZOSYN) IVPB 3.375 g        3.375 g 100 mL/hr over 30 Minutes Intravenous  Once 05/30/22 2106 05/30/22 2216       Medications: Scheduled Meds:  gabapentin  300 mg Oral TID   insulin aspart  0-15 Units Subcutaneous TID WC   insulin aspart  0-5 Units Subcutaneous QHS   insulin glargine-yfgn  27 Units Subcutaneous BID   pantoprazole  40 mg Oral BID   potassium chloride  40 mEq Oral Once   Continuous Infusions:  cefTRIAXone (ROCEPHIN)  IV 200 mL/hr at 06/01/22 0451   metronidazole 100 mL/hr at 06/01/22 0451   PRN Meds:.acetaminophen **OR** acetaminophen, morphine injection, ondansetron **OR** ondansetron (ZOFRAN) IV  Assessment/Plan: Mr. Rufener is a 77 year old with cirrhosis who presented with concern for cholecystitis and hyperbilirubinemia.  WBC normal today Symptoms improving, tolerating diet Still tender over RUQ - but says this pain started as he was jostled from one bed to another Continue low fat diet LFTs improving Discharge on low fat diet  Follow up with surgery team if needed GI Follow up   LOS: 1 day    Quentin Ore, MD  [facility] Use [email or web address] to contact on call provider  Daily Billing: [address] - Moderate MDM

## 2022-06-01 NOTE — Progress Notes (Signed)
PROGRESS NOTE    Patrick Kidd  ZOX:[id number] DOB: 12/19/1945 DOA: 05/30/2022 PCP: Etta Grandchild, MD   Brief Narrative:  77 year old male history of alcoholic cirrhosis, CLL, diabetes, hypertension, peripheral neuropathy, morbid obesity, history of Wolff-Parkinson-White syndrome, prior history of prostate cancer, presents to the ER today with 5-day history of feeling poorly.  He started having vomiting x 1 on Tuesday.  Began having abdominal pain the same day.  Has noted dark-colored urine for the last 3 to 4 days.  He had 4 loose stools today.  He had a fever on Monday night.  He has had low-grade temperatures the rest of the week.  Was seen in urgent care today.  Had labs drawn.  Bilirubin came back elevated at 5.6.  It had been 2.2 about 1 month ago.  He was referred to the ER for evaluation.   On arrival temp 98.3 but increased to 100.8 heart rate 98 blood pressure 198/83.  Heart rate is increased to 122.   White count 8.8, hemoglobin 14.3, platelets 81 Sodium 132, potassium 4.3, chloride 94, bicarb 24, BUN of 15, creatinine  1.05, glucose of 317, total protein 6.9, albumin 3.3, AST 34, ALT 36, alk phos 121, total bili 5.6   INR 1.2   CT abdomen pelvis demonstrated high layer density material in the dependent portion of the gallbladder.  Mild wall thickening.  Mild fat stranding adjacent to the gallbladder.  No dilatation of the bile ducts.  Liver is cirrhotic.   EDP discussed the case Dr. Sophronia Simas with surgery.  Dr. Freida Busman recommended admission to the hospital to the hospitalist.  And to order a MRCP if the patient's LFTs continue to rise.  Surgery will see the patient in the morning.   Assessment & Plan:   Principal Problem:   Sludge in gallbladder Active Problems:   Hyperbilirubinemia   Type 2 diabetes mellitus with complication, with long-term current use of insulin (HCC)   Essential hypertension   WPW (Wolff-Parkinson-White syndrome)   Stage 3a chronic kidney disease  (HCC)   Chronic lymphocytic leukemia (HCC)   Peripheral neuropathy   E coli bacteremia  Sludge in gallbladder/possible acute cholecystitis/E. coli bacteremia Patient did have abdominal pain and right upper quadrant tenderness with positive Murphy sign on my exam yesterday.  Although his pain and tenderness is improving but he also had low-grade fever of 100.8 night before.  No leukocytosis but has elevated procalcitonin.  On the morning of 05/31/2022, discussed with general surgery/Dr. Dossie Der, he is not impressed and convinced that this is acute cholecystitis however he recommended continuing antibiotics for now and reassessment tomorrow morning.  Patient and wife told me that he is already seen by general surgery and they have cleared him for discharge.  However patient's LFTs are not resulted yet today and now his blood cultures growing E. coli which raises my suspicion for possible real acute cholecystitis.  Patient is on Rocephin which will cover E. coli, I will wait for final sensitivities.  I will now proceed with HIDA scan, if positive, patient may benefit from cholecystectomy.  All of that discussed in detail with patient and his wife.  Several questions answered.   Hyperbilirubinemia May be due to cirrhosis.  Bilirubin improved yesterday.  LFTs normal.  No plan for MRCP.  Waiting for LFTs today.   Chronic lymphocytic leukemia (HCC) Chronic. Follows with heme/onc. Has low platelets chronically. SCDs for prophylaxis.   Stage 3a chronic kidney disease (HCC) ruled out.  Patient's creatinine  is normal.   WPW (Wolff-Parkinson-White syndrome) Avoid betablockers and non-dihydropyridine calcium channel blockers(cardizem, verpamil). Pt states he "outgrew" his WPW in his early 20-30s.    Essential hypertension Stable.  He is not on any medications.   Type 2 diabetes mellitus with complication, with long-term current use of insulin (HCC) Appears to be taking Lantus 50 units nightly and  metformin at home.  Has been on 20 units of Lantus twice daily but is still hyperglycemic and required almost 24 units of NovoLog in last 24 hours so I will increase Semglee further to 27 units twice daily.   Peripheral neuropathy Continue neurontin  DVT prophylaxis: SCDs Start: 05/31/22 0009   Code Status: Full Code  Family Communication:  None present at bedside.  Plan of care discussed with patient in length and he/she verbalized understanding and agreed with it. Status is: Inpatient Remains inpatient appropriate because: Needs HIDA scan and final sensitivities of blood culture.    Estimated body mass index is 29.83 kg/m as calculated from the following:   Height as of this encounter: 6\' 2"  (1.88 m).   Weight as of this encounter: 105.4 kg.    Nutritional Assessment: Body mass index is 29.83 kg/m.Marland Kitchen Seen by dietician.  I agree with the assessment and plan as outlined below: Nutrition Status:        . Skin Assessment: I have examined the patient's skin and I agree with the wound assessment as performed by the wound care RN as outlined below:    Consultants:  General surgery.  Procedures:  None  Antimicrobials:  Anti-infectives (From admission, onward)    Start     Dose/Rate Route Frequency Ordered Stop   05/31/22 0200  cefTRIAXone (ROCEPHIN) 2 g in sodium chloride 0.9 % 100 mL IVPB        2 g 200 mL/hr over 30 Minutes Intravenous Every 24 hours 05/30/22 2242     05/31/22 0200  metroNIDAZOLE (FLAGYL) IVPB 500 mg        500 mg 100 mL/hr over 60 Minutes Intravenous Every 12 hours 05/30/22 2242     05/30/22 2115  piperacillin-tazobactam (ZOSYN) IVPB 3.375 g        3.375 g 100 mL/hr over 30 Minutes Intravenous  Once 05/30/22 2106 05/30/22 2216         Subjective: Patient seen and examined.  Wife at the bedside.  Patient states that he feels better and feels ready to go home and actually wants to go home but after explaining the rationale and details about  positive blood culture and HIDA scan, he is agreeable to comply with recommendations to stay overnight.  Objective: Vitals:   05/31/22 2006 06/01/22 0555 06/01/22 0738 06/01/22 1003  BP: (!) 144/61 (!) 142/72 (!) 142/63   Pulse: (!) 119 99 (!) 110   Resp: 18 18 20    Temp: 99.5 F (37.5 C) 98.1 F (36.7 C) 98.5 F (36.9 C)   TempSrc: Oral Oral Oral   SpO2: 96% 97% 96% 97%  Weight:      Height:        Intake/Output Summary (Last 24 hours) at 06/01/2022 1059 Last data filed at 06/01/2022 0935 Gross per 24 hour  Intake 520 ml  Output --  Net 520 ml   Filed Weights   05/31/22 0008  Weight: 105.4 kg    Examination:  General exam: Appears calm and comfortable  Respiratory system: Clear to auscultation. Respiratory effort normal. Cardiovascular system: S1 & S2 heard, RRR. No JVD,  murmurs, rubs, gallops or clicks. No pedal edema. Gastrointestinal system: Abdomen is nondistended, soft and very mild right upper quadrant tenderness. No organomegaly or masses felt. Normal bowel sounds heard. Central nervous system: Alert and oriented. No focal neurological deficits. Extremities: Symmetric 5 x 5 power. Skin: No rashes, lesions or ulcers.  Psychiatry: Judgement and insight appear normal. Mood & affect appropriate.    Data Reviewed: I have personally reviewed following labs and imaging studies  CBC: Recent Labs  Lab 05/30/22 1251 05/31/22 0109 06/01/22 0149  WBC 8.8 9.4 9.7  NEUTROABS 5.8 6.4 6.6  HGB 14.3 13.0 12.6*  HCT 41.2 37.1* 35.6*  MCV 95.6 93.0 93.0  PLT 81* 73* 77*   Basic Metabolic Panel: Recent Labs  Lab 05/30/22 1251 05/31/22 0109 06/01/22 0149  NA 132* 131* 127*  K 4.3 3.6 3.4*  CL 94* 99 95*  CO2 24 21* 24  GLUCOSE 317* 290* 316*  BUN 15 13 18   CREATININE 1.05 1.08 1.04  CALCIUM 8.8* 8.4* 7.9*  MG  --  1.2*  --    GFR: Estimated Creatinine Clearance: 77 mL/min (by C-G formula based on SCr of 1.04 mg/dL). Liver Function Tests: Recent Labs  Lab  05/30/22 1251 05/31/22 0109  AST 32 22  ALT 36 27  ALKPHOS 121 101  BILITOT 5.6* 4.7*  PROT 6.9 6.0*  ALBUMIN 3.3* 2.8*   Recent Labs  Lab 05/30/22 1251  LIPASE 30   No results for input(s): "AMMONIA" in the last 168 hours. Coagulation Profile: Recent Labs  Lab 05/30/22 1717  INR 1.2   Cardiac Enzymes: No results for input(s): "CKTOTAL", "CKMB", "CKMBINDEX", "TROPONINI" in the last 168 hours. BNP (last 3 results) No results for input(s): "PROBNP" in the last 8760 hours. HbA1C: No results for input(s): "HGBA1C" in the last 72 hours. CBG: Recent Labs  Lab 05/31/22 0727 05/31/22 1131 05/31/22 1617 05/31/22 2009 06/01/22 0736  GLUCAP 244* 227* 305* 264* 253*   Lipid Profile: No results for input(s): "CHOL", "HDL", "LDLCALC", "TRIG", "CHOLHDL", "LDLDIRECT" in the last 72 hours. Thyroid Function Tests: No results for input(s): "TSH", "T4TOTAL", "FREET4", "T3FREE", "THYROIDAB" in the last 72 hours. Anemia Panel: No results for input(s): "VITAMINB12", "FOLATE", "FERRITIN", "TIBC", "IRON", "RETICCTPCT" in the last 72 hours. Sepsis Labs: Recent Labs  Lab 05/30/22 2124 05/31/22 0109  PROCALCITON  --  6.96  LATICACIDVEN 1.7  --     Recent Results (from the past 240 hour(s))  Urine Culture     Status: None   Collection Time: 05/30/22 12:39 PM   Specimen: Urine, Clean Catch  Result Value Ref Range Status   Specimen Description URINE, CLEAN CATCH  Final   Special Requests NONE  Final   Culture   Final    NO GROWTH Performed at [facility], [address] [place], [place] [address]    Report Status 05/31/2022 FINAL  Final  Blood culture (routine x 2)     Status: Abnormal (Preliminary result)   Collection Time: 05/30/22  9:20 PM   Specimen: BLOOD RIGHT FOREARM  Result Value Ref Range Status   Specimen Description BLOOD RIGHT FOREARM  Final   Special Requests   Final    BOTTLES DRAWN AEROBIC AND ANAEROBIC Blood Culture adequate volume   Culture  Setup  Time   Final    IN BOTH AEROBIC AND ANAEROBIC BOTTLES GRAM NEGATIVE RODS CRITICAL RESULT CALLED TO, READ BACK BY AND VERIFIED WITH:  C/ PHARMD L. CURRAN 05/31/22 1149 A. LAFRANCE  Culture (A)  Final    ESCHERICHIA COLI SUSCEPTIBILITIES TO FOLLOW Performed at [facility], [address], [place], [place] [address]    Report Status PENDING  Incomplete  Blood Culture ID Panel (Reflexed)     Status: Abnormal   Collection Time: 05/30/22  9:20 PM  Result Value Ref Range Status   Enterococcus faecalis NOT DETECTED NOT DETECTED Final   Enterococcus Faecium NOT DETECTED NOT DETECTED Final   Listeria monocytogenes NOT DETECTED NOT DETECTED Final   Staphylococcus species NOT DETECTED NOT DETECTED Final   Staphylococcus aureus (BCID) NOT DETECTED NOT DETECTED Final   Staphylococcus epidermidis NOT DETECTED NOT DETECTED Final   Staphylococcus lugdunensis NOT DETECTED NOT DETECTED Final   Streptococcus species NOT DETECTED NOT DETECTED Final   Streptococcus agalactiae NOT DETECTED NOT DETECTED Final   Streptococcus pneumoniae NOT DETECTED NOT DETECTED Final   Streptococcus pyogenes NOT DETECTED NOT DETECTED Final   A.calcoaceticus-baumannii NOT DETECTED NOT DETECTED Final   Bacteroides fragilis NOT DETECTED NOT DETECTED Final   Enterobacterales DETECTED (A) NOT DETECTED Final    Comment: Enterobacterales represent a large order of gram negative bacteria, not a single organism. CRITICAL RESULT CALLED TO, READ BACK BY AND VERIFIED WITH:  C/ PHARMD L. CURRAN 05/31/22 1149 A. LAFRANCE    Enterobacter cloacae complex NOT DETECTED NOT DETECTED Final   Escherichia coli DETECTED (A) NOT DETECTED Final    Comment: CRITICAL RESULT CALLED TO, READ BACK BY AND VERIFIED WITH:  C/ PHARMD L. CURRAN 05/31/22 1149 A. LAFRANCE    Klebsiella aerogenes NOT DETECTED NOT DETECTED Final   Klebsiella oxytoca NOT DETECTED NOT DETECTED Final   Klebsiella pneumoniae NOT DETECTED NOT DETECTED Final   Proteus  species NOT DETECTED NOT DETECTED Final   Salmonella species NOT DETECTED NOT DETECTED Final   Serratia marcescens NOT DETECTED NOT DETECTED Final   Haemophilus influenzae NOT DETECTED NOT DETECTED Final   Neisseria meningitidis NOT DETECTED NOT DETECTED Final   Pseudomonas aeruginosa NOT DETECTED NOT DETECTED Final   Stenotrophomonas maltophilia NOT DETECTED NOT DETECTED Final   Candida albicans NOT DETECTED NOT DETECTED Final   Candida auris NOT DETECTED NOT DETECTED Final   Candida glabrata NOT DETECTED NOT DETECTED Final   Candida krusei NOT DETECTED NOT DETECTED Final   Candida parapsilosis NOT DETECTED NOT DETECTED Final   Candida tropicalis NOT DETECTED NOT DETECTED Final   Cryptococcus neoformans/gattii NOT DETECTED NOT DETECTED Final   CTX-M ESBL NOT DETECTED NOT DETECTED Final   Carbapenem resistance IMP NOT DETECTED NOT DETECTED Final   Carbapenem resistance KPC NOT DETECTED NOT DETECTED Final   Carbapenem resistance NDM NOT DETECTED NOT DETECTED Final   Carbapenem resist OXA 48 LIKE NOT DETECTED NOT DETECTED Final   Carbapenem resistance VIM NOT DETECTED NOT DETECTED Final    Comment: Performed at [facility], [address] [place], [place] [address]  Blood culture (routine x 2)     Status: None (Preliminary result)   Collection Time: 05/30/22  9:28 PM   Specimen: BLOOD LEFT FOREARM  Result Value Ref Range Status   Specimen Description BLOOD LEFT FOREARM  Final   Special Requests   Final    BOTTLES DRAWN AEROBIC AND ANAEROBIC Blood Culture results may not be optimal due to an excessive volume of blood received in culture bottles   Culture   Final    NO GROWTH 2 DAYS Performed at [facility], [address] [place], [place] [address]  Report Status PENDING  Incomplete     Radiology Studies: US Abdomen Limited RUQ (LIVER/GB)  Result Date: 05/30/2022 CLINICAL DATA:  Elevated bilirubin EXAM: ULTRASOUND ABDOMEN LIMITED RIGHT UPPER QUADRANT COMPARISON:   CT from earlier in the same day, ultrasound from 02/25/2022. FINDINGS: Gallbladder: Gallbladder is well distended with multiple stones and gallbladder sludge within. Mild wall thickening at 4 mm is noted. No pericholecystic fluid is seen. Negative sonographic Murphy's sign is elicited. Common bile duct: Diameter: 4 mm Liver: 6.4 cm cyst is noted in the right lobe of the liver similar to that seen on the prior exam. Some dependent density is noted within which may be related to prior hemorrhage. No other focal abnormality is noted in the liver. Portal vein is patent on color Doppler imaging with normal direction of blood flow towards the liver. Other: None. IMPRESSION: Gallstones and gallbladder sludge with mild wall thickening. Negative sonographic Murphy's sign is elicited. These changes are stable from the prior ultrasound exam. Hepatic cyst similar to that seen on recent CT examination. Dependent density is noted within the overall appearance is similar to that seen on prior exam from January of 2024. Electronically Signed   By: Alcide Clever M.D.   On: 05/30/2022 23:01   CT ABDOMEN PELVIS W CONTRAST  Result Date: 05/30/2022 CLINICAL DATA:  Abdominal pain, vomiting, constipation EXAM: CT ABDOMEN AND PELVIS WITH CONTRAST TECHNIQUE: Multidetector CT imaging of the abdomen and pelvis was performed using the standard protocol following bolus administration of intravenous contrast. RADIATION DOSE REDUCTION: This exam was performed according to the departmental dose-optimization program which includes automated exposure control, adjustment of the mA and/or kV according to patient size and/or use of iterative reconstruction technique. CONTRAST:  75mL OMNIPAQUE IOHEXOL 350 MG/ML SOLN COMPARISON:  01/28/2019 FINDINGS: Lower chest: Small linear densities are seen in lower lung fields suggesting scarring or subsegmental atelectasis. Coronary artery calcifications are seen. Hepatobiliary: There is nodularity in the liver  surface. There is 6.4 cm smooth marginated fluid density lesion in the right lobe suggesting cyst. There is layering high density in the dependent portion of gallbladder lumen. There is mild wall thickening in gallbladder. There is stranding in the fat adjacent to the gallbladder. There is no dilation of bile ducts. Pancreas: No focal abnormalities are seen in pancreas. Diverticulum is noted along the inner margin of second portion of duodenum. Spleen: Spleen measures 15.3 cm in maximum diameter. Adrenals/Urinary Tract: Adrenals are unremarkable. There is no hydronephrosis. There is possible 1 mm right renal calculus. Ureters are not dilated. Urinary bladder is not distended. There is mild diffuse wall thickening in the bladder. Stomach/Bowel: Stomach is not distended. Small bowel loops are not dilated. Appendix is not dilated. Scattered diverticula are seen in colon. There is no evidence of focal acute diverticulitis. Vascular/Lymphatic: Scattered arterial calcifications are seen. There are scattered subcentimeter nodes suggesting reactive hyperplasia. Reproductive: Metallic densities are seen in prostate suggesting previous brachytherapy. Other: There is no ascites or pneumoperitoneum. Small bilateral inguinal hernias containing fat are seen. Musculoskeletal: Degenerative changes are noted in thoracic and lumbar spine with bony spurs. IMPRESSION: There is no evidence of intestinal obstruction or pneumoperitoneum. There is no hydronephrosis. Appendix is not dilated. Gallbladder is distended. There is layering of high density in the dependent portion of gallbladder suggesting presence of sludge and stones. There is mild pericholecystic stranding. Possibility of acute cholecystitis is not excluded. If clinically warranted, follow-up gallbladder sonogram may be considered. There is no dilation of bile ducts. Coronary artery disease.  There is a 6.4 cm hepatic cyst. Cirrhosis of liver. Splenomegaly. There is tiny  nonobstructing right renal calculus. Scattered diverticula are seen in colon without signs of focal diverticulitis. There is mild diffuse wall thickening in the urinary bladder which may be due to incomplete distention or suggest cystitis. Electronically Signed   By: Ernie Avena M.D.   On: 05/30/2022 20:43   DG Abd 2 Views  Result Date: 05/30/2022 CLINICAL DATA:  Constipation, abdominal pain. EXAM: ABDOMEN - 2 VIEW COMPARISON:  Abdominal radiographs 02/02/2019. Abdominal MRI 08/14/2020. FINDINGS: Supine and erect views (4 total) are submitted. There is a normal, nonobstructive bowel gas pattern. Colonic stool burden does not appear increased. No evidence of pneumoperitoneum, bowel wall thickening or suspicious abdominal calcification. Prostate brachytherapy seeds are noted. There are mild degenerative changes in the spine. IMPRESSION: No evidence of acute abdominal process. Electronically Signed   By: Carey Bullocks M.D.   On: 05/30/2022 11:32    Scheduled Meds:  gabapentin  300 mg Oral TID   insulin aspart  0-15 Units Subcutaneous TID WC   insulin aspart  0-5 Units Subcutaneous QHS   insulin glargine-yfgn  27 Units Subcutaneous BID   pantoprazole  40 mg Oral BID   Continuous Infusions:  cefTRIAXone (ROCEPHIN)  IV 200 mL/hr at 06/01/22 0451   metronidazole 100 mL/hr at 06/01/22 0451     LOS: 1 day   Hughie Closs, MD [facility]  06/01/2022, 10:59 AM   *Please note that this is a verbal dictation therefore any spelling or grammatical errors are due to the "Dragon Medical One" system interpretation.  Please page via Amion and do not message via secure chat for urgent patient care matters. Secure chat can be used for non urgent patient care matters.  How to contact the [facility] Attending or Consulting provider 7A - 7P or covering provider during after hours 7P -7A, for this patient?  Check the care team in [facility] and look for a) attending/consulting [facility] provider listed and b) the  [facility] team listed. Page or secure chat 7A-7P. Log into [email or web address] and use [facility] universal password to access. If you do not have the password, please contact the hospital operator. Locate the [facility] provider you are looking for under [facility] and page to a number that you can be directly reached. If you still have difficulty reaching the provider, please page the [facility] (Director on Call) for the Hospitalists listed on amion for assistance.

## 2022-06-01 NOTE — Plan of Care (Signed)

## 2022-06-02 ENCOUNTER — Inpatient Hospital Stay (HOSPITAL_COMMUNITY): Payer: Medicare Other

## 2022-06-02 DIAGNOSIS — K828 Other specified diseases of gallbladder: Secondary | ICD-10-CM | POA: Diagnosis not present

## 2022-06-02 LAB — CBC WITH DIFFERENTIAL/PLATELET
Abs Immature Granulocytes: 0.18 10*3/uL — ABNORMAL HIGH (ref 0.00–0.07)
Basophils Absolute: 0 10*3/uL (ref 0.0–0.1)
Basophils Relative: 0 %
Eosinophils Absolute: 0 10*3/uL (ref 0.0–0.5)
Eosinophils Relative: 0 %
HCT: 34.8 % — ABNORMAL LOW (ref 39.0–52.0)
Hemoglobin: 12.5 g/dL — ABNORMAL LOW (ref 13.0–17.0)
Immature Granulocytes: 2 %
Lymphocytes Relative: 25 %
Lymphs Abs: 2.9 10*3/uL (ref 0.7–4.0)
MCH: 33.4 pg (ref 26.0–34.0)
MCHC: 35.9 g/dL (ref 30.0–36.0)
MCV: 93 fL (ref 80.0–100.0)
Monocytes Absolute: 1 10*3/uL (ref 0.1–1.0)
Monocytes Relative: 9 %
Neutro Abs: 7.3 10*3/uL (ref 1.7–7.7)
Neutrophils Relative %: 64 %
Platelets: 82 10*3/uL — ABNORMAL LOW (ref 150–400)
RBC: 3.74 MIL/uL — ABNORMAL LOW (ref 4.22–5.81)
RDW: 12.7 % (ref 11.5–15.5)
WBC: 11.4 10*3/uL — ABNORMAL HIGH (ref 4.0–10.5)
nRBC: 0 % (ref 0.0–0.2)

## 2022-06-02 LAB — GLUCOSE, CAPILLARY
Glucose-Capillary: 192 mg/dL — ABNORMAL HIGH (ref 70–99)
Glucose-Capillary: 192 mg/dL — ABNORMAL HIGH (ref 70–99)
Glucose-Capillary: 208 mg/dL — ABNORMAL HIGH (ref 70–99)
Glucose-Capillary: 238 mg/dL — ABNORMAL HIGH (ref 70–99)

## 2022-06-02 LAB — COMPREHENSIVE METABOLIC PANEL
ALT: 17 U/L (ref 0–44)
AST: 18 U/L (ref 15–41)
Albumin: 2.4 g/dL — ABNORMAL LOW (ref 3.5–5.0)
Alkaline Phosphatase: 92 U/L (ref 38–126)
Anion gap: 9 (ref 5–15)
BUN: 15 mg/dL (ref 8–23)
CO2: 25 mmol/L (ref 22–32)
Calcium: 7.9 mg/dL — ABNORMAL LOW (ref 8.9–10.3)
Chloride: 94 mmol/L — ABNORMAL LOW (ref 98–111)
Creatinine, Ser: 1.2 mg/dL (ref 0.61–1.24)
GFR, Estimated: >60 mL/min (ref >60)
Glucose, Bld: 227 mg/dL — ABNORMAL HIGH (ref 70–99)
Potassium: 3.6 mmol/L (ref 3.5–5.1)
Sodium: 128 mmol/L — ABNORMAL LOW (ref 135–145)
Total Bilirubin: 2.5 mg/dL — ABNORMAL HIGH (ref 0.3–1.2)
Total Protein: 5.9 g/dL — ABNORMAL LOW (ref 6.5–8.1)

## 2022-06-02 LAB — CULTURE, BLOOD (ROUTINE X 2): Special Requests: ADEQUATE

## 2022-06-02 MED ORDER — MORPHINE SULFATE (PF) 2 MG/ML IV SOLN
INTRAVENOUS | Status: AC
  Filled 2022-06-02: qty 1

## 2022-06-02 MED ORDER — MORPHINE SULFATE (PF) 2 MG/ML IV SOLN
3.0000 mg | Freq: Once | INTRAVENOUS | Status: AC
  Administered 2022-06-02: 3 mg via INTRAVENOUS

## 2022-06-02 MED ORDER — TECHNETIUM TC 99M MEBROFENIN IV KIT
7.5000 | PACK | Freq: Once | INTRAVENOUS | Status: AC | PRN
  Administered 2022-06-02: 7.5 via INTRAVENOUS

## 2022-06-02 NOTE — Progress Notes (Signed)
[facility] Progress Note     Subjective: CC:  I asked the patient to tell me his history leading up to his hospitalization: Reports acute onset abdominal distention, nausea, and vomiting Tuesday night after eating pot roast. He developed diarrhea on Wednesday and then his symptoms temporarily improved. Pain recurrent Thursday when he went to a meeting and had steak and potatoes. His abdominal discomfort, bloating, dark urine, and diarrhea prompted him to go to the hospital. Patients states his  His wife of 45 years is at the bedside.    Today he reports RUQ pain that he attributes to a rib strain after being moved from one bed to another when he got up to 61M. He denies nausea  Objective: Vital signs in last 24 hours: Temp:  [98.4 F (36.9 C)-100.8 F (38.2 C)] 99.5 F (37.5 C) (04/29 0721) Pulse Rate:  [95-124] 99 (04/29 0721) Resp:  [18-20] 20 (04/29 0721) BP: (116-143)/(62-84) 116/84 (04/29 0721) SpO2:  [96 %-100 %] 100 % (04/29 0721) Weight:  [104.4 kg] 104.4 kg (04/29 1100) Last BM Date : 05/31/22  Intake/Output from previous day: 04/28 0701 - 04/29 0700 In: 120 [P.O.:120] Out: -  Intake/Output this shift: No intake/output data recorded.  PE: Gen:  Alert, NAD, pleasant Card:  Regular rate and rhythm, pedal pulses 2+ BL Pulm:  Normal effort, clear to auscultation bilaterally Abd: Soft, protuberant, focally tender in the RUQ, +murphy's sign, no peritonitis, there is a soft umbilical hernia  Skin: warm and dry, no rashes  Psych: A&Ox3   Lab Results:  Recent Labs    06/01/22 0149 06/02/22 0053  WBC 9.7 11.4*  HGB 12.6* 12.5*  HCT 35.6* 34.8*  PLT 77* 82*   BMET Recent Labs    06/01/22 0149 06/02/22 0053  NA 127* 128*  K 3.4* 3.6  CL 95* 94*  CO2 24 25  GLUCOSE 316* 227*  BUN 18 15  CREATININE 1.04 1.20  CALCIUM 7.9* 7.9*   PT/INR Recent Labs    05/30/22 1717  LABPROT 14.7  INR 1.2   CMP     Component Value Date/Time   NA 128 (L)  06/02/2022 0053   K 3.6 06/02/2022 0053   CL 94 (L) 06/02/2022 0053   CO2 25 06/02/2022 0053   GLUCOSE 227 (H) 06/02/2022 0053   BUN 15 06/02/2022 0053   CREATININE 1.20 06/02/2022 0053   CREATININE 1.02 11/15/2021 1325   CREATININE 1.13 06/04/2010 1354   CALCIUM 7.9 (L) 06/02/2022 0053   PROT 5.9 (L) 06/02/2022 0053   ALBUMIN 2.4 (L) 06/02/2022 0053   AST 18 06/02/2022 0053   AST 20 11/15/2021 1325   ALT 17 06/02/2022 0053   ALT 10 11/15/2021 1325   ALKPHOS 92 06/02/2022 0053   BILITOT 2.5 (H) 06/02/2022 0053   BILITOT 1.2 11/15/2021 1325   GFRNONAA >60 06/02/2022 0053   GFRNONAA >60 11/15/2021 1325   GFRAA >60 02/06/2019 0546   Lipase     Component Value Date/Time   LIPASE 30 05/30/2022 1251       Studies/Results: No results found.  Anti-infectives: Anti-infectives (From admission, onward)    Start     Dose/Rate Route Frequency Ordered Stop   05/31/22 0200  cefTRIAXone (ROCEPHIN) 2 g in sodium chloride 0.9 % 100 mL IVPB        2 g 200 mL/hr over 30 Minutes Intravenous Every 24 hours 05/30/22 2242     05/31/22 0200  metroNIDAZOLE (FLAGYL) IVPB 500 mg  500 mg 100 mL/hr over 60 Minutes Intravenous Every 12 hours 05/30/22 2242     05/30/22 2115  piperacillin-tazobactam (ZOSYN) IVPB 3.375 g        3.375 g 100 mL/hr over 30 Minutes Intravenous  Once 05/30/22 2106 05/30/22 2216        Assessment/Plan  Acute calculous cholecystitis   - this patients exam, labs, and history are concerning for cholecystitis. Agree with HIDA to confirm. Given his cirrhosis with portal hypertension I think lap chole is moderate to high risk for perioperative complication. Continue IV abx for now, may benefit from percutaneous cholecystostomy tube (if he is deemed a candidate by IR), if the infection does not clear with abx alone.  Alcoholic cirrhosis - MELD score 22 (19.6% estimated 3 mos mortality)  HTN DM2 on insulin CKD 3 CLL Peripheral neuropathy    LOS: 2 days   I  reviewed nursing notes, ED provider notes, hospitalist notes, last 24 h vitals and pain scores, last 48 h intake and output, last 24 h labs and trends, and last 24 h imaging results.  This care required high  level of medical decision making.   Hosie Spangle, PA-C [facility] Please see Amion for pager number during day hours 7:00am-4:30pm

## 2022-06-02 NOTE — Progress Notes (Signed)
PROGRESS NOTE    Patrick Kidd  ONG:[id number] DOB: [birth year] DOA: 05/30/2022 PCP: Etta Grandchild, MD   Brief Narrative:  77 year old male history of alcoholic cirrhosis, CLL, diabetes, hypertension, peripheral neuropathy, morbid obesity, history of Wolff-Parkinson-White syndrome, prior history of prostate cancer, presents to the ER today with 5-day history of feeling poorly.  He started having vomiting x 1 on Tuesday.  Began having abdominal pain the same day.  Has noted dark-colored urine for the last 3 to 4 days.  He had 4 loose stools today.  He had a fever on Monday night.  He has had low-grade temperatures the rest of the week.  Was seen in urgent care today.  Had labs drawn.  Bilirubin came back elevated at 5.6.  It had been 2.2 about 1 month ago.  He was referred to the ER for evaluation.   On arrival temp 98.3 but increased to 100.8 heart rate 98 blood pressure 198/83.  Heart rate is increased to 122.   White count 8.8, hemoglobin 14.3, platelets 81 Sodium 132, potassium 4.3, chloride 94, bicarb 24, BUN of 15, creatinine  1.05, glucose of 317, total protein 6.9, albumin 3.3, AST 34, ALT 36, alk phos 121, total bili 5.6   INR 1.2   CT abdomen pelvis demonstrated high layer density material in the dependent portion of the gallbladder.  Mild wall thickening.  Mild fat stranding adjacent to the gallbladder.  No dilatation of the bile ducts.  Liver is cirrhotic.   EDP discussed the case Dr. Sophronia Simas with surgery.  Dr. Freida Busman recommended admission to the hospital to the hospitalist.  And to order a MRCP if the patient's LFTs continue to rise.  Surgery will see the patient in the morning.   Assessment & Plan:   Principal Problem:   Sludge in gallbladder Active Problems:   Hyperbilirubinemia   Type 2 diabetes mellitus with complication, with long-term current use of insulin (HCC)   Essential hypertension   WPW (Wolff-Parkinson-White syndrome)   Stage 3a chronic kidney disease  (HCC)   Chronic lymphocytic leukemia (HCC)   Peripheral neuropathy   E coli bacteremia  Sludge in gallbladder/possible acute cholecystitis/E. coli bacteremia Patient did have abdominal pain and right upper quadrant tenderness with positive Murphy sign on my exam yesterday.  Although his pain and tenderness is improving but he also had low-grade fever of 100.8 night before.  No leukocytosis but has elevated procalcitonin.  On the morning of 05/31/2022, discussed with general surgery/Dr. Dossie Der, he was not impressed and convinced that this is acute cholecystitis however he recommended continuing antibiotics for now and reassessment tomorrow morning and then later that day, BC ID was growing E. coli.  On 06/01/2022, early morning he started feeling better but still had right upper quadrant tenderness.  He was seen and told by general surgery that he could go home.  However due to my strong suspicion of acute cholecystitis, I have ordered HIDA scan which is still pending.  Overnight, patient had another episode of low-grade fever 100.8.  Now he has leukocytosis as well.  He already had elevated procalcitonin.  My suspicion for acute cholecystitis is a bit stronger now.  Patient a little frustrated because he was told by surgery yesterday that he would go home but he is grateful that I kept him overnight and I am pursuing further workup for acute cholecystitis.  It appears that general surgery technically signed off yesterday but I told him that I would reconsult general surgeon.  He has requested a different surgeon to see him.  I have reconsulted general surgery already.  We will continue current antibiotics.  Per my discussion with PA with the surgery, due to his cirrhosis, patient will be high risk for the surgery.  However they are going to see patient and discussed the strategy about how to treat his acute cholecystitis.  I doubt that only antibiotics are going to be sufficient enough.  However will defer  to general surgery.   Hyperbilirubinemia May be due to cirrhosis.  Bilirubin continues to.  LFTs normal.   Chronic lymphocytic leukemia (HCC) Chronic. Follows with heme/onc. Has low platelets chronically. SCDs for prophylaxis.   Stage 3a chronic kidney disease (HCC) ruled out.  Patient's creatinine is normal.   WPW (Wolff-Parkinson-White syndrome) Avoid betablockers and non-dihydropyridine calcium channel blockers(cardizem, verpamil). Pt states he "outgrew" his WPW in his early 20-30s.    Essential hypertension Stable.  He is not on any medications.   Type 2 diabetes mellitus with complication, with long-term current use of insulin (HCC) Appears to be taking Lantus 50 units nightly and metformin at home.  Currently on Semglee 27 units twice daily and SSI, blood sugar much better.   Peripheral neuropathy Continue neurontin  DVT prophylaxis: SCDs Start: 05/31/22 0009   Code Status: Full Code  Family Communication: Wife present at bedside.  Plan of care discussed with patient in length and he/she verbalized understanding and agreed with it. Status is: Inpatient Remains inpatient appropriate because: Pending hida scan and reassessment by general surgery.   Estimated body mass index is 29.83 kg/m as calculated from the following:   Height as of this encounter: 6\' 2"  (1.88 m).   Weight as of this encounter: 105.4 kg.    Nutritional Assessment: Body mass index is 29.83 kg/m.Marland Kitchen Seen by dietician.  I agree with the assessment and plan as outlined below: Nutrition Status:        . Skin Assessment: I have examined the patient's skin and I agree with the wound assessment as performed by the wound care RN as outlined below:    Consultants:  General surgery.  Procedures:  None  Antimicrobials:  Anti-infectives (From admission, onward)    Start     Dose/Rate Route Frequency Ordered Stop   05/31/22 0200  cefTRIAXone (ROCEPHIN) 2 g in sodium chloride 0.9 % 100 mL IVPB         2 g 200 mL/hr over 30 Minutes Intravenous Every 24 hours 05/30/22 2242     05/31/22 0200  metroNIDAZOLE (FLAGYL) IVPB 500 mg        500 mg 100 mL/hr over 60 Minutes Intravenous Every 12 hours 05/30/22 2242     05/30/22 2115  piperacillin-tazobactam (ZOSYN) IVPB 3.375 g        3.375 g 100 mL/hr over 30 Minutes Intravenous  Once 05/30/22 2106 05/30/22 2216         Subjective: Patient seen and examined.  Wife at the bedside.  He says that he still has mild abdominal pain.  Had fever overnight.  No other complaint.  A little frustrated for the reasons mentioned above.  Objective: Vitals:   06/01/22 1800 06/01/22 2127 06/02/22 0438 06/02/22 0721  BP:  (!) 143/65 127/74 116/84  Pulse: (!) 108 (!) 109 95 99  Resp:  18 18 20   Temp:  (!) 100.8 F (38.2 C) 98.4 F (36.9 C) 99.5 F (37.5 C)  TempSrc:  Oral Oral Oral  SpO2:  96% 96% 100%  Weight:      Height:       No intake or output data in the 24 hours ending 06/02/22 1043  Filed Weights   05/31/22 0008  Weight: 105.4 kg    Examination:  General exam: Appears calm and comfortable  Respiratory system: Clear to auscultation. Respiratory effort normal. Cardiovascular system: S1 & S2 heard, RRR. No JVD, murmurs, rubs, gallops or clicks. No pedal edema. Gastrointestinal system: Abdomen is nondistended, soft and right upper quadrant tenderness. No organomegaly or masses felt. Normal bowel sounds heard. Central nervous system: Alert and oriented. No focal neurological deficits. Extremities: Symmetric 5 x 5 power. Skin: No rashes, lesions or ulcers.  Psychiatry: Judgement and insight appear normal. Mood & affect appropriate.    Data Reviewed: I have personally reviewed following labs and imaging studies  CBC: Recent Labs  Lab 05/30/22 1251 05/31/22 0109 06/01/22 0149 06/02/22 0053  WBC 8.8 9.4 9.7 11.4*  NEUTROABS 5.8 6.4 6.6 7.3  HGB 14.3 13.0 12.6* 12.5*  HCT 41.2 37.1* 35.6* 34.8*  MCV 95.6 93.0 93.0 93.0  PLT 81*  73* 77* 82*    Basic Metabolic Panel: Recent Labs  Lab 05/30/22 1251 05/31/22 0109 06/01/22 0149 06/02/22 0053  NA 132* 131* 127* 128*  K 4.3 3.6 3.4* 3.6  CL 94* 99 95* 94*  CO2 24 21* 24 25  GLUCOSE 317* 290* 316* 227*  BUN 15 13 18 15   CREATININE 1.05 1.08 1.04 1.20  CALCIUM 8.8* 8.4* 7.9* 7.9*  MG  --  1.2*  --   --     GFR: Estimated Creatinine Clearance: 66.7 mL/min (by C-G formula based on SCr of 1.2 mg/dL). Liver Function Tests: Recent Labs  Lab 05/30/22 1251 05/31/22 0109 06/01/22 0149 06/02/22 0053  AST 32 22 15 18   ALT 36 27 19 17   ALKPHOS 121 101 93 92  BILITOT 5.6* 4.7* 2.9* 2.5*  PROT 6.9 6.0* 5.9* 5.9*  ALBUMIN 3.3* 2.8* 2.6* 2.4*    Recent Labs  Lab 05/30/22 1251  LIPASE 30    No results for input(s): "AMMONIA" in the last 168 hours. Coagulation Profile: Recent Labs  Lab 05/30/22 1717  INR 1.2    Cardiac Enzymes: No results for input(s): "CKTOTAL", "CKMB", "CKMBINDEX", "TROPONINI" in the last 168 hours. BNP (last 3 results) No results for input(s): "PROBNP" in the last 8760 hours. HbA1C: No results for input(s): "HGBA1C" in the last 72 hours. CBG: Recent Labs  Lab 06/01/22 0736 06/01/22 1124 06/01/22 1633 06/01/22 2125 06/02/22 0718  GLUCAP 253* 299* 258* 327* 192*    Lipid Profile: No results for input(s): "CHOL", "HDL", "LDLCALC", "TRIG", "CHOLHDL", "LDLDIRECT" in the last 72 hours. Thyroid Function Tests: No results for input(s): "TSH", "T4TOTAL", "FREET4", "T3FREE", "THYROIDAB" in the last 72 hours. Anemia Panel: No results for input(s): "VITAMINB12", "FOLATE", "FERRITIN", "TIBC", "IRON", "RETICCTPCT" in the last 72 hours. Sepsis Labs: Recent Labs  Lab 05/30/22 2124 05/31/22 0109  PROCALCITON  --  6.96  LATICACIDVEN 1.7  --      Recent Results (from the past 240 hour(s))  Urine Culture     Status: None   Collection Time: 05/30/22 12:39 PM   Specimen: Urine, Clean Catch  Result Value Ref Range Status    Specimen Description URINE, CLEAN CATCH  Final   Special Requests NONE  Final   Culture   Final    NO GROWTH Performed at [facility], [address], [place], [place] [address]    Report Status  05/31/2022 FINAL  Final  Blood culture (routine x 2)     Status: Abnormal   Collection Time: 05/30/22  9:20 PM   Specimen: BLOOD RIGHT FOREARM  Result Value Ref Range Status   Specimen Description BLOOD RIGHT FOREARM  Final   Special Requests   Final    BOTTLES DRAWN AEROBIC AND ANAEROBIC Blood Culture adequate volume   Culture  Setup Time   Final    IN BOTH AEROBIC AND ANAEROBIC BOTTLES GRAM NEGATIVE RODS CRITICAL RESULT CALLED TO, READ BACK BY AND VERIFIED WITH:  C/ PHARMD L. CURRAN 05/31/22 1149 A. LAFRANCE Performed at [facility], [address] [place], [place] [address]    Culture ESCHERICHIA COLI (A)  Final   Report Status 06/02/2022 FINAL  Final   Organism ID, Bacteria ESCHERICHIA COLI  Final      Susceptibility   Escherichia coli - MIC*    AMPICILLIN >=32 RESISTANT Resistant     CEFEPIME <=0.12 SENSITIVE Sensitive     CEFTAZIDIME <=1 SENSITIVE Sensitive     CEFTRIAXONE <=0.25 SENSITIVE Sensitive     CIPROFLOXACIN <=0.25 SENSITIVE Sensitive     GENTAMICIN <=1 SENSITIVE Sensitive     IMIPENEM <=0.25 SENSITIVE Sensitive     TRIMETH/SULFA <=20 SENSITIVE Sensitive     AMPICILLIN/SULBACTAM 16 INTERMEDIATE Intermediate     PIP/TAZO <=4 SENSITIVE Sensitive     * ESCHERICHIA COLI  Blood Culture ID Panel (Reflexed)     Status: Abnormal   Collection Time: 05/30/22  9:20 PM  Result Value Ref Range Status   Enterococcus faecalis NOT DETECTED NOT DETECTED Final   Enterococcus Faecium NOT DETECTED NOT DETECTED Final   Listeria monocytogenes NOT DETECTED NOT DETECTED Final   Staphylococcus species NOT DETECTED NOT DETECTED Final   Staphylococcus aureus (BCID) NOT DETECTED NOT DETECTED Final   Staphylococcus epidermidis NOT DETECTED NOT DETECTED Final   Staphylococcus  lugdunensis NOT DETECTED NOT DETECTED Final   Streptococcus species NOT DETECTED NOT DETECTED Final   Streptococcus agalactiae NOT DETECTED NOT DETECTED Final   Streptococcus pneumoniae NOT DETECTED NOT DETECTED Final   Streptococcus pyogenes NOT DETECTED NOT DETECTED Final   A.calcoaceticus-baumannii NOT DETECTED NOT DETECTED Final   Bacteroides fragilis NOT DETECTED NOT DETECTED Final   Enterobacterales DETECTED (A) NOT DETECTED Final    Comment: Enterobacterales represent a large order of gram negative bacteria, not a single organism. CRITICAL RESULT CALLED TO, READ BACK BY AND VERIFIED WITH:  C/ PHARMD L. CURRAN 05/31/22 1149 A. LAFRANCE    Enterobacter cloacae complex NOT DETECTED NOT DETECTED Final   Escherichia coli DETECTED (A) NOT DETECTED Final    Comment: CRITICAL RESULT CALLED TO, READ BACK BY AND VERIFIED WITH:  C/ PHARMD L. CURRAN 05/31/22 1149 A. LAFRANCE    Klebsiella aerogenes NOT DETECTED NOT DETECTED Final   Klebsiella oxytoca NOT DETECTED NOT DETECTED Final   Klebsiella pneumoniae NOT DETECTED NOT DETECTED Final   Proteus species NOT DETECTED NOT DETECTED Final   Salmonella species NOT DETECTED NOT DETECTED Final   Serratia marcescens NOT DETECTED NOT DETECTED Final   Haemophilus influenzae NOT DETECTED NOT DETECTED Final   Neisseria meningitidis NOT DETECTED NOT DETECTED Final   Pseudomonas aeruginosa NOT DETECTED NOT DETECTED Final   Stenotrophomonas maltophilia NOT DETECTED NOT DETECTED Final   Candida albicans NOT DETECTED NOT DETECTED Final   Candida auris NOT DETECTED NOT DETECTED Final   Candida glabrata NOT DETECTED NOT DETECTED Final   Candida krusei NOT DETECTED NOT DETECTED Final  Candida parapsilosis NOT DETECTED NOT DETECTED Final   Candida tropicalis NOT DETECTED NOT DETECTED Final   Cryptococcus neoformans/gattii NOT DETECTED NOT DETECTED Final   CTX-M ESBL NOT DETECTED NOT DETECTED Final   Carbapenem resistance IMP NOT DETECTED NOT DETECTED  Final   Carbapenem resistance KPC NOT DETECTED NOT DETECTED Final   Carbapenem resistance NDM NOT DETECTED NOT DETECTED Final   Carbapenem resist OXA 48 LIKE NOT DETECTED NOT DETECTED Final   Carbapenem resistance VIM NOT DETECTED NOT DETECTED Final    Comment: Performed at [facility], [address] [place], [place] [address]  Blood culture (routine x 2)     Status: None (Preliminary result)   Collection Time: 05/30/22  9:28 PM   Specimen: BLOOD LEFT FOREARM  Result Value Ref Range Status   Specimen Description BLOOD LEFT FOREARM  Final   Special Requests   Final    BOTTLES DRAWN AEROBIC AND ANAEROBIC Blood Culture results may not be optimal due to an excessive volume of blood received in culture bottles   Culture   Final    NO GROWTH 3 DAYS Performed at [facility], [address] [place], [place] [address]    Report Status PENDING  Incomplete     Radiology Studies: No results found.  Scheduled Meds:  gabapentin  300 mg Oral TID   insulin aspart  0-15 Units Subcutaneous TID WC   insulin aspart  0-5 Units Subcutaneous QHS   insulin glargine-yfgn  27 Units Subcutaneous BID   pantoprazole  40 mg Oral BID   Continuous Infusions:  cefTRIAXone (ROCEPHIN)  IV 2 g (06/02/22 0104)   metronidazole 500 mg (06/02/22 0254)     LOS: 2 days   Hughie Closs, MD [facility]  06/02/2022, 10:43 AM   *Please note that this is a verbal dictation therefore any spelling or grammatical errors are due to the "Dragon Medical One" system interpretation.  Please page via Amion and do not message via secure chat for urgent patient care matters. Secure chat can be used for non urgent patient care matters.  How to contact the [facility] Attending or Consulting provider 7A - 7P or covering provider during after hours 7P -7A, for this patient?  Check the care team in [facility] and look for a) attending/consulting TRH provider listed and b) the [facility] team listed. Page or secure chat 7A-7P. Log  into [email or web address] and use [place]'s universal password to access. If you do not have the password, please contact the hospital operator. Locate the [facility] provider you are looking for under [facility] and page to a number that you can be directly reached. If you still have difficulty reaching the provider, please page the [facility] (Director on Call) for the Hospitalists listed on amion for assistance.

## 2022-06-02 NOTE — Progress Notes (Signed)
PT Cancellation Note  Patient Details Name: Patrick Kidd MRN: [id number] DOB: [birth year]   Cancelled Treatment:    Reason Eval/Treat Not Completed: Patient at procedure or test/unavailable. PT will attempt to follow up once pt is back to the floor and appropriate to mobilize.   Arlyss Gandy 06/02/2022, 2:13 PM

## 2022-06-02 NOTE — Inpatient Diabetes Management (Signed)
Inpatient Diabetes Program Recommendations  AACE/ADA: New Consensus Statement on Inpatient Glycemic Control (2015)  Target Ranges:  Prepandial:   less than 140 mg/dL      Peak postprandial:   less than 180 mg/dL (1-2 hours)      Critically ill patients:  140 - 180 mg/dL   Lab Results  Component Value Date   GLUCAP 192 (H) 06/02/2022   HGBA1C 7.2 (H) 05/07/2022    Review of Glycemic Control  Latest Reference Range & Units 06/01/22 07:36 06/01/22 11:24 06/01/22 16:33 06/01/22 21:25 06/02/22 07:18 06/02/22 11:08  Glucose-Capillary 70 - 99 mg/dL 161 (H) 096 (H) 045 (H) 327 (H) 192 (H) 192 (H)  (H): Data is abnormally high  Diabetes history: DM2 Outpatient Diabetes medications: Toujeo 50 units QD, Metformin 500 mg BID Current orders for Inpatient glycemic control: Semglee 27 units BID, Novolog 0-15 units TID & 0-5 units QHS  Inpatient Diabetes Program Recommendations:    Semglee 28 units BID  Will continue to follow while inpatient.  Thank you, Dulce Sellar, MSN, CDCES Diabetes Coordinator Inpatient Diabetes Program [phone] (team pager from 8a-5p)

## 2022-06-02 NOTE — Plan of Care (Signed)

## 2022-06-02 NOTE — Progress Notes (Signed)
  Transition of Care [facility]) Screening Note   Patient Details  Name: SASUKE YAFFE Date of Birth: [birth year]   Transition of Care [facility]) CM/SW Contact:    Harriet Masson, RN Phone Number: 06/02/2022, 3:42 PM    Transition of Care Department [facility]) has reviewed patient and no TOC needs have been identified at this time. We will continue to monitor patient advancement through interdisciplinary progression rounds. If new patient transition needs arise, please place a TOC consult.

## 2022-06-02 NOTE — Evaluation (Signed)
Physical Therapy Evaluation Patient Details Name: Patrick Kidd MRN: [id number] DOB: [birth year] Today's Date: 06/02/2022  History of Present Illness  77 y.o. male presents to [facility] hospital on 05/30/2022 with vomiting, abdominal pain, dark urine and loose stools. CT abdomen demonstrates high layer density material in the dependent portion of the gallbladder. PMH includes alcoholic cirrhosis, CLL, diabetes, HTN, peripheral neuropathy, Wolff-Parkinson-White syndrome, prostate CA.  Clinical Impression  Pt presents to PT with deficits in strength, power, endurance, balance. Pt reports becoming deconditioned due to immobility over recent days, he had been recovering from back surgery 4 weeks ago and was recently able to return to ambulating without DME prior to this recent decline. Pt is able to ambulate well with support of a RW. PT encourages frequent ambulation in an effort to restore independence. PT anticipates no post-acute PT needs.       Recommendations for follow up therapy are one component of a multi-disciplinary discharge planning process, led by the attending physician.  Recommendations may be updated based on patient status, additional functional criteria and insurance authorization.  Follow Up Recommendations       Assistance Recommended at Discharge PRN  Patient can return home with the following  A little help with bathing/dressing/bathroom;Assistance with cooking/housework;Assist for transportation    Equipment Recommendations None recommended by PT  Recommendations for Other Services       Functional Status Assessment Patient has had a recent decline in their functional status and demonstrates the ability to make significant improvements in function in a reasonable and predictable amount of time.     Precautions / Restrictions Precautions Precautions: Fall;Back Precaution Booklet Issued: No Precaution Comments: back surgery 4 weeks ago Restrictions Weight Bearing  Restrictions: No      Mobility  Bed Mobility                    Transfers Overall transfer level: Needs assistance Equipment used: Rolling walker (2 wheels) Transfers: Sit to/from Stand Sit to Stand: Supervision                Ambulation/Gait Ambulation/Gait assistance: Supervision Gait Distance (Feet): 300 Feet Assistive device: Rolling walker (2 wheels) Gait Pattern/deviations: Step-through pattern Gait velocity: functional Gait velocity interpretation: 1.31 - 2.62 ft/sec, indicative of limited community ambulator   General Gait Details: steady step-through gait  Stairs            Wheelchair Mobility    Modified Rankin (Stroke Patients Only)       Balance Overall balance assessment: Needs assistance Sitting-balance support: Feet supported, No upper extremity supported Sitting balance-Leahy Scale: Good     Standing balance support: Single extremity supported, Reliant on assistive device for balance Standing balance-Leahy Scale: Poor                               Pertinent Vitals/Pain Pain Assessment Pain Assessment: Faces Faces Pain Scale: Hurts a little bit Pain Location: back Pain Descriptors / Indicators: Sore Pain Intervention(s): Monitored during session    Home Living Family/patient expects to be discharged to:: Private residence Living Arrangements: Spouse/significant other Available Help at Discharge: Family Type of Home: House Home Access: Stairs to enter Entrance Stairs-Rails: None Entrance Stairs-Number of Steps: 1   Home Layout: One level Home Equipment: Cane - single Librarian, academic (2 wheels);Wheelchair - manual      Prior Function Prior Level of Function : Independent/Modified Independent;Working/employed;Driving  Mobility Comments: 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 [employer], had recently progressed to ambulating without a device since back surgery       Hand Dominance   Dominant Hand:  Right    Extremity/Trunk Assessment   Upper Extremity Assessment Upper Extremity Assessment: Overall WFL for tasks assessed    Lower Extremity Assessment Lower Extremity Assessment: Generalized weakness    Cervical / Trunk Assessment Cervical / Trunk Assessment: Normal;Back Surgery (back surgery 4 weeks ago)  Communication   Communication: No difficulties  Cognition Arousal/Alertness: Awake/alert Behavior During Therapy: WFL for tasks assessed/performed Overall Cognitive Status: Within Functional Limits for tasks assessed                                          General Comments General comments (skin integrity, edema, etc.): VSS on RA    Exercises     Assessment/Plan    PT Assessment Patient needs continued PT services  PT Problem List Decreased strength;Decreased activity tolerance;Decreased balance;Decreased mobility       PT Treatment Interventions DME instruction;Gait training;Stair training;Functional mobility training;Therapeutic activities;Therapeutic exercise;Balance training;Neuromuscular re-education;Patient/family education    PT Goals (Current goals can be found in the Care Plan section)  Acute Rehab PT Goals Patient Stated Goal: to return to independence PT Goal Formulation: With patient Time For Goal Achievement: 06/16/22 Potential to Achieve Goals: Good Additional Goals Additional Goal #1: Pt will score >19/24 on the DGI to indicate a reduced risk for falls Additional Goal #2: Pt will score >45/56 on the BERG to indicate a reduced risk for falls    Frequency Min 3X/week     Co-evaluation               AM-PAC PT "6 Clicks" Mobility  Outcome Measure Help needed turning from your back to your side while in a flat bed without using bedrails?: A Little Help needed moving from lying on your back to sitting on the side of a flat bed without using bedrails?: A Little Help needed moving to and from a bed to a chair (including a  wheelchair)?: A Little Help needed standing up from a chair using your arms (e.g., wheelchair or bedside chair)?: A Little Help needed to walk in hospital room?: A Little Help needed climbing 3-5 steps with a railing? : A Little 6 Click Score: 18    End of Session   Activity Tolerance: Patient tolerated treatment well Patient left: in chair;with call bell/phone within reach;with family/visitor present Nurse Communication: Mobility status PT Visit Diagnosis: Other abnormalities of gait and mobility (R26.89);Muscle weakness (generalized) (M62.81)    Time: 1610-9604 PT Time Calculation (min) (ACUTE ONLY): 16 min   Charges:   PT Evaluation $PT Eval Low Complexity: 1 Low          Arlyss Gandy, PT, DPT Acute Rehabilitation Office [phone]   Arlyss Gandy 06/02/2022, 3:14 PM

## 2022-06-03 ENCOUNTER — Other Ambulatory Visit (HOSPITAL_COMMUNITY): Payer: Self-pay

## 2022-06-03 DIAGNOSIS — K828 Other specified diseases of gallbladder: Secondary | ICD-10-CM | POA: Diagnosis not present

## 2022-06-03 LAB — CBC WITH DIFFERENTIAL/PLATELET
Abs Immature Granulocytes: 0.08 10*3/uL — ABNORMAL HIGH (ref 0.00–0.07)
Basophils Absolute: 0 10*3/uL (ref 0.0–0.1)
Basophils Relative: 0 %
Eosinophils Absolute: 0 10*3/uL (ref 0.0–0.5)
Eosinophils Relative: 0 %
HCT: 35.7 % — ABNORMAL LOW (ref 39.0–52.0)
Hemoglobin: 12.8 g/dL — ABNORMAL LOW (ref 13.0–17.0)
Immature Granulocytes: 1 %
Lymphocytes Relative: 36 %
Lymphs Abs: 3.6 10*3/uL (ref 0.7–4.0)
MCH: 32.7 pg (ref 26.0–34.0)
MCHC: 35.9 g/dL (ref 30.0–36.0)
MCV: 91.3 fL (ref 80.0–100.0)
Monocytes Absolute: 0.9 10*3/uL (ref 0.1–1.0)
Monocytes Relative: 9 %
Neutro Abs: 5.5 10*3/uL (ref 1.7–7.7)
Neutrophils Relative %: 54 %
Platelets: 121 10*3/uL — ABNORMAL LOW (ref 150–400)
RBC: 3.91 MIL/uL — ABNORMAL LOW (ref 4.22–5.81)
RDW: 12.6 % (ref 11.5–15.5)
WBC: 10.2 10*3/uL (ref 4.0–10.5)
nRBC: 0 % (ref 0.0–0.2)

## 2022-06-03 LAB — COMPREHENSIVE METABOLIC PANEL
ALT: 15 U/L (ref 0–44)
AST: 18 U/L (ref 15–41)
Albumin: 2.4 g/dL — ABNORMAL LOW (ref 3.5–5.0)
Alkaline Phosphatase: 93 U/L (ref 38–126)
Anion gap: 12 (ref 5–15)
BUN: 13 mg/dL (ref 8–23)
CO2: 24 mmol/L (ref 22–32)
Calcium: 8.1 mg/dL — ABNORMAL LOW (ref 8.9–10.3)
Chloride: 94 mmol/L — ABNORMAL LOW (ref 98–111)
Creatinine, Ser: 1.07 mg/dL (ref 0.61–1.24)
GFR, Estimated: >60 mL/min (ref >60)
Glucose, Bld: 190 mg/dL — ABNORMAL HIGH (ref 70–99)
Potassium: 3.4 mmol/L — ABNORMAL LOW (ref 3.5–5.1)
Sodium: 130 mmol/L — ABNORMAL LOW (ref 135–145)
Total Bilirubin: 1.7 mg/dL — ABNORMAL HIGH (ref 0.3–1.2)
Total Protein: 5.8 g/dL — ABNORMAL LOW (ref 6.5–8.1)

## 2022-06-03 LAB — CULTURE, BLOOD (ROUTINE X 2)

## 2022-06-03 LAB — PROCALCITONIN: Procalcitonin: 3.5 ng/mL

## 2022-06-03 LAB — GLUCOSE, CAPILLARY
Glucose-Capillary: 163 mg/dL — ABNORMAL HIGH (ref 70–99)
Glucose-Capillary: 312 mg/dL — ABNORMAL HIGH (ref 70–99)

## 2022-06-03 MED ORDER — METRONIDAZOLE 500 MG PO TABS
500.0000 mg | ORAL_TABLET | Freq: Three times a day (TID) | ORAL | 0 refills | Status: AC
  Filled 2022-06-03: qty 21, 7d supply, fill #0

## 2022-06-03 MED ORDER — POTASSIUM CHLORIDE CRYS ER 20 MEQ PO TBCR
40.0000 meq | EXTENDED_RELEASE_TABLET | Freq: Once | ORAL | Status: AC
  Administered 2022-06-03: 40 meq via ORAL
  Filled 2022-06-03: qty 2

## 2022-06-03 MED ORDER — CIPROFLOXACIN HCL 500 MG PO TABS
500.0000 mg | ORAL_TABLET | Freq: Two times a day (BID) | ORAL | 0 refills | Status: AC
  Filled 2022-06-03: qty 14, 7d supply, fill #0

## 2022-06-03 NOTE — Progress Notes (Signed)
Progress Note     Subjective: Pt reports mild RUQ discomfort but pain overall is much improved. He is tolerating PO intake and denies nausea or vomiting. He would very much like to go home today. Wife at bedside as well. We discussed treatment with antibiotics alone and low fat diet at home. Discussed that with cirrhosis patient is high risk for perioperative complication and mortality and they are in agreement with trying to manage this conservatively.   Objective: Vital signs in last 24 hours: Temp:  [97.7 F (36.5 C)-99.6 F (37.6 C)] 98.1 F (36.7 C) (04/30 0800) Pulse Rate:  [96-102] 96 (04/30 0800) Resp:  [17-20] 18 (04/30 0800) BP: (120-152)/(53-82) 126/53 (04/30 0800) SpO2:  [96 %-100 %] 96 % (04/30 0800) Weight:  [104.4 kg] 104.4 kg (04/29 1100) Last BM Date : 06/03/22  Intake/Output from previous day: No intake/output data recorded. Intake/Output this shift: No intake/output data recorded.  PE: General: pleasant, WD, overweight male who is sitting up in NAD HEENT: sclera anicteric Heart: regular, rate, and rhythm.   Lungs: Respiratory effort nonlabored Abd: soft, mild ttp on deep palpation of RUQ, +BS, moderate distention, reducible umbilical hernia  MS: all 4 extremities are symmetrical with no cyanosis, clubbing, or edema. Psych: A&Ox3 with an appropriate affect.    Lab Results:  Recent Labs    06/02/22 0053 06/03/22 0319  WBC 11.4* 10.2  HGB 12.5* 12.8*  HCT 34.8* 35.7*  PLT 82* 121*   BMET Recent Labs    06/02/22 0053 06/03/22 0319  NA 128* 130*  K 3.6 3.4*  CL 94* 94*  CO2 25 24  GLUCOSE 227* 190*  BUN 15 13  CREATININE 1.20 1.07  CALCIUM 7.9* 8.1*   PT/INR No results for input(s): "LABPROT", "INR" in the last 72 hours. CMP     Component Value Date/Time   NA 130 (L) 06/03/2022 0319   K 3.4 (L) 06/03/2022 0319   CL 94 (L) 06/03/2022 0319   CO2 24 06/03/2022 0319   GLUCOSE 190 (H) 06/03/2022 0319   BUN 13 06/03/2022 0319    CREATININE 1.07 06/03/2022 0319   CREATININE 1.02 11/15/2021 1325   CREATININE 1.13 06/04/2010 1354   CALCIUM 8.1 (L) 06/03/2022 0319   PROT 5.8 (L) 06/03/2022 0319   ALBUMIN 2.4 (L) 06/03/2022 0319   AST 18 06/03/2022 0319   AST 20 11/15/2021 1325   ALT 15 06/03/2022 0319   ALT 10 11/15/2021 1325   ALKPHOS 93 06/03/2022 0319   BILITOT 1.7 (H) 06/03/2022 0319   BILITOT 1.2 11/15/2021 1325   GFRNONAA >60 06/03/2022 0319   GFRNONAA >60 11/15/2021 1325   GFRAA >60 02/06/2019 0546   Lipase     Component Value Date/Time   LIPASE 30 05/30/2022 1251       Studies/Results: NM Hepatobiliary Liver Func  Result Date: 06/02/2022 CLINICAL DATA:  Abdominal pain, gallbladder stones EXAM: NUCLEAR MEDICINE HEPATOBILIARY IMAGING TECHNIQUE: Sequential images of the abdomen were obtained out to 60 minutes following intravenous administration of radiopharmaceutical. RADIOPHARMACEUTICALS:  7.5 mCi Tc-1m  Choletec IV COMPARISON:  CT done on 05/30/2022, sonogram done on 05/30/2022 FINDINGS: There is prompt entry of tracer into bile ducts. There is activity in duodenum in 15 minutes. Gallbladder is not visualized. There is activity in jejunum in 20 minutes. IMPRESSION: There is no obstruction of common bile duct. There is no demonstrable activity in the gallbladder. Findings suggest possible cystic duct obstruction and cholecystitis. Electronically Signed   By: Harlan Stains.D.  On: 06/02/2022 15:35    Anti-infectives: Anti-infectives (From admission, onward)    Start     Dose/Rate Route Frequency Ordered Stop   05/31/22 0200  cefTRIAXone (ROCEPHIN) 2 g in sodium chloride 0.9 % 100 mL IVPB        2 g 200 mL/hr over 30 Minutes Intravenous Every 24 hours 05/30/22 2242     05/31/22 0200  metroNIDAZOLE (FLAGYL) IVPB 500 mg        500 mg 100 mL/hr over 60 Minutes Intravenous Every 12 hours 05/30/22 2242     05/30/22 2115  piperacillin-tazobactam (ZOSYN) IVPB 3.375 g        3.375 g 100 mL/hr  over 30 Minutes Intravenous  Once 05/30/22 2106 05/30/22 2216        Assessment/Plan  Acute calculous cholecystitis  - HIDA positive for acute cholecystitis.  - symptoms significantly improved with abx alone, WBC 10.4 and afebrile, tolerating PO intake  - I think reasonable to discharge home with PO abx and follow up with GI, can follow up with general surgery as needed. Recommend low fat diet on DC - discussed return precautions and risks of recurrent cholecystitis - if patient develops recurrent infection would consider percutaneous cholecystostomy prior to surgical intervention  - Given his cirrhosis with portal hypertension I think lap chole is moderate to high risk for perioperative complication.   Alcoholic cirrhosis - MELD score 22 (19.6% estimated 3 mos mortality)  HTN DM2 on insulin CKD 3 CLL Peripheral neuropathy    LOS: 3 days   I reviewed hospitalist notes, last 24 h vitals and pain scores, last 48 h intake and output, last 24 h labs and trends, and last 24 h imaging results.   Juliet Rude, [facility] 06/03/2022, 10:59 AM Please see Amion for pager number during day hours 7:00am-4:30pm

## 2022-06-03 NOTE — Plan of Care (Signed)
A/Ox4 and on room air. No complaints of pain this shift. Remains on IV antibiotics. Up with self care. No overnight issues   Problem: Education: Goal: Ability to describe self-care measures that may prevent or decrease complications (Diabetes Survival Skills Education) will improve Outcome: Progressing Goal: Individualized Educational Video(s) Outcome: Progressing   Problem: Coping: Goal: Ability to adjust to condition or change in health will improve Outcome: Progressing   Problem: Fluid Volume: Goal: Ability to maintain a balanced intake and output will improve Outcome: Progressing   Problem: Health Behavior/Discharge Planning: Goal: Ability to identify and utilize available resources and services will improve Outcome: Progressing Goal: Ability to manage health-related needs will improve Outcome: Progressing   Problem: Metabolic: Goal: Ability to maintain appropriate glucose levels will improve Outcome: Progressing   Problem: Nutritional: Goal: Maintenance of adequate nutrition will improve Outcome: Progressing Goal: Progress toward achieving an optimal weight will improve Outcome: Progressing   Problem: Skin Integrity: Goal: Risk for impaired skin integrity will decrease Outcome: Progressing   Problem: Tissue Perfusion: Goal: Adequacy of tissue perfusion will improve Outcome: Progressing   Problem: Education: Goal: Knowledge of General Education information will improve Description: Including pain rating scale, medication(s)/side effects and non-pharmacologic comfort measures Outcome: Progressing   Problem: Health Behavior/Discharge Planning: Goal: Ability to manage health-related needs will improve Outcome: Progressing   Problem: Clinical Measurements: Goal: Ability to maintain clinical measurements within normal limits will improve Outcome: Progressing Goal: Will remain free from infection Outcome: Progressing Goal: Diagnostic test results will  improve Outcome: Progressing Goal: Respiratory complications will improve Outcome: Progressing Goal: Cardiovascular complication will be avoided Outcome: Progressing   Problem: Activity: Goal: Risk for activity intolerance will decrease Outcome: Progressing   Problem: Nutrition: Goal: Adequate nutrition will be maintained Outcome: Progressing   Problem: Coping: Goal: Level of anxiety will decrease Outcome: Progressing   Problem: Elimination: Goal: Will not experience complications related to bowel motility Outcome: Progressing Goal: Will not experience complications related to urinary retention Outcome: Progressing   Problem: Pain Managment: Goal: General experience of comfort will improve Outcome: Progressing   Problem: Safety: Goal: Ability to remain free from injury will improve Outcome: Progressing   Problem: Skin Integrity: Goal: Risk for impaired skin integrity will decrease Outcome: Progressing

## 2022-06-03 NOTE — Care Management Important Message (Signed)
Important Message  Patient Details  Name: Patrick Kidd MRN: [id number] Date of Birth: 1946-01-11   Medicare Important Message Given:  Yes     Dorena Bodo 06/03/2022, 2:59 PM

## 2022-06-03 NOTE — Discharge Summary (Signed)
Physician Discharge Summary  Patrick Kidd:[id number] DOB: 1945/02/05 DOA: 05/30/2022  PCP: Etta Grandchild, MD  Admit date: 05/30/2022 Discharge date: 06/03/2022 30 Day Unplanned Readmission Risk Score    Flowsheet Row ED to Hosp-Admission (Current) from 05/30/2022 in [facility] GENERAL MED/SURG UNIT  30 Day Unplanned Readmission Risk Score (%) 15.74 Filed at 06/03/2022 1200       This score is the patient's risk of an unplanned readmission within 30 days of being discharged (0 -100%). The score is based on dignosis, age, lab data, medications, orders, and past utilization.   Low:  0-14.9   Medium: 15-21.9   High: 22-29.9   Extreme: 30 and above          Admitted From: Home Disposition: Home  Recommendations for Outpatient Follow-up:  Follow up with PCP in 1-2 weeks Please obtain BMP/CBC in one week Follow-up with general today as they have recommended. Please follow up with your PCP on the following pending results: Unresulted Labs (From admission, onward)    None         Home Health: None Equipment/Devices: None  Discharge Condition: Stable CODE STATUS: Full code Diet recommendation: Low-fat diet  Subjective: Seen and examined.  Doing well.  No nausea abdominal pain or vomiting or any other complaint.  He desires to go home.  Brief/Interim Summary: 77 year old male history of alcoholic cirrhosis, CLL, diabetes, hypertension, peripheral neuropathy, morbid obesity, history of Wolff-Parkinson-White syndrome, prior history of prostate cancer, presents to the ER today with 5-day history of feeling poorly.  He started having vomiting x 1 on Tuesday.  Began having abdominal pain the same day.  He had a fever on Monday night.  He has had low-grade temperatures the rest of the week.  Was seen in urgent care on day of admission.  Had labs drawn.  Bilirubin came back elevated at 5.6.  It had been 2.2 about 1 month ago.  He was referred to the ER for evaluation. On  arrival temp 98.3 but increased to 100.8 heart rate 98 blood pressure 198/83.  Heart rate is increased to 122.  No leukocytosis.  AST 34, ALT 36, alk phos 121, total bili 5.6 CT abdomen pelvis demonstrated high layer density material in the dependent portion of the gallbladder.  Mild wall thickening.  Mild fat stranding adjacent to the gallbladder.  No dilatation of the bile ducts.  Liver is cirrhotic.EDP discussed the case Dr. Sophronia Simas with surgery.  Dr. Freida Busman recommended admission to the hospital to the hospitalist.  After admission, patient did develop low-grade fever of 100.8 at 2 different locations.  His procalcitonin was found to be significantly elevated.  On exam, he did have right upper quadrant tenderness.  His bilirubin however started to improve.  He was seen by general surgery who initially opined that his symptoms were not impressive for acute cholecystitis, and was cleared to go home however the blood culture grew E. coli.  This raised further suspicion of possible acute cholecystitis-as the source of E. coli bacteremia.  HIDA scan was done which confirmed acute cholecystitis.  Surgery was reconsulted.  Patient remained on Rocephin and Flagyl during the whole time.  Patient's symptoms have improved.  He is tolerating diet without any abdominal pain.  Has mild right upper quadrant tenderness still.  General surgery has had lengthy discussion with the patient and have opined that patient will be high risk for cholecystectomy or cholecystostomy drain and thus they recommended to treat this with antibiotics.  They have also informed the patient that he will be at risk of persistent or recurrent acute cholecystitis and patient has elected to be conservative with antibiotics only.  Based on the sensitivities, we are discharging him on Flagyl and ciprofloxacin for 7 more days to complete 10-day course.   Hyperbilirubinemia May be due to cirrhosis.  Bilirubin continues to improve.  LFTs normal.    Chronic lymphocytic leukemia (HCC) Chronic. Follows with heme/onc. Has low platelets chronically. SCDs for prophylaxis.   Stage 3a chronic kidney disease (HCC) ruled out.  Patient's creatinine is normal.   WPW (Wolff-Parkinson-White syndrome) Avoid betablockers and non-dihydropyridine calcium channel blockers(cardizem, verpamil). Pt states he "outgrew" his WPW in his early 20-30s.    Essential hypertension Stable.  He is not on any medications.   Type 2 diabetes mellitus with complication, with long-term current use of insulin (HCC) Resume home medications.   Peripheral neuropathy Continue neurontin  Discharge plan was discussed with patient and/or family member/wife and they verbalized understanding and agreed with it.  Discharge Diagnoses:  Principal Problem:   Sludge in gallbladder Active Problems:   Hyperbilirubinemia   Type 2 diabetes mellitus with complication, with long-term current use of insulin (HCC)   Essential hypertension   WPW (Wolff-Parkinson-White syndrome)   Stage 3a chronic kidney disease (HCC)   Chronic lymphocytic leukemia (HCC)   Peripheral neuropathy   E coli bacteremia    Discharge Instructions   Allergies as of 06/03/2022       Reactions   Rifaximin Nausea And Vomiting   Required EMS visit, although patient did not go to hospital   Beta Adrenergic Blockers    Likely WPW.  Use with caution   Calcium Channel Blockers    Likely WPW.  Use with caution.        Medication List     STOP taking these medications    alum & mag hydroxide-simeth 400-400-40 MG/5ML suspension Commonly known as: MAALOX PLUS       TAKE these medications    accu-chek multiclix lancets Use to check sugar daily. DX: E11.8   acetaminophen 325 MG tablet Commonly known as: TYLENOL Take 650 mg by mouth daily as needed for moderate pain.   ciprofloxacin 500 MG tablet Commonly known as: CIPRO Take 1 tablet (500 mg total) by mouth 2 (two) times daily for 7  days.   FreeStyle Libre 3 Reader Devi 1 Act by Does not apply route daily.   FreeStyle Libre 3 Sensor Misc 1 Act by Does not apply route daily. Place 1 sensor on the skin every 14 days. Use to check glucose continuously   gabapentin 300 MG capsule Commonly known as: NEURONTIN Take 1 capsule (300 mg total) by mouth 3 (three) times daily. What changed: when to take this   metFORMIN 500 MG tablet Commonly known as: GLUCOPHAGE Take 1 tablet (500 mg total) by mouth 2 (two) times daily with a meal.   metroNIDAZOLE 500 MG tablet Commonly known as: Flagyl Take 1 tablet (500 mg total) by mouth 3 (three) times daily for 7 days.   pantoprazole 40 MG tablet Commonly known as: PROTONIX TAKE 1 TABLET 30 MINUTES BEFORE MEALS (BREAKFAST AND SUPPER) TWICE A DAY What changed:  how much to take how to take this when to take this   Pen Needles 32G X 5 MM Misc Inject 1 Act into the skin daily. Use to administer insulin. Dx E11.9 Dispense based on insurance preference.   Toujeo Max SoloStar 300 UNIT/ML Solostar Pen  Generic drug: insulin glargine (2 Unit Dial) Inject 50 Units into the skin daily. Via [facility] pt assistance        Follow-up Information     Surgery, [place]. Call.   Specialty: General Surgery Why: As needed Contact information: [address] [place] [address] [phone]         Iva Boop, MD. Schedule an appointment as soon as possible for a visit in 3 week(s).   Specialty: Gastroenterology Contact information: [address] [place] [address] [phone]         Etta Grandchild, MD Follow up in 1 week(s).   Specialty: Internal Medicine Contact information: [address] [place] [address] [phone]                Allergies  Allergen Reactions   Rifaximin Nausea And Vomiting    Required EMS visit, although patient did not go to hospital   Beta Adrenergic Blockers     Likely WPW.  Use with  caution   Calcium Channel Blockers     Likely WPW.  Use with caution.    Consultations: General surgery   Procedures/Studies: NM Hepatobiliary Liver Func  Result Date: 06/02/2022 CLINICAL DATA:  Abdominal pain, gallbladder stones EXAM: NUCLEAR MEDICINE HEPATOBILIARY IMAGING TECHNIQUE: Sequential images of the abdomen were obtained out to 60 minutes following intravenous administration of radiopharmaceutical. RADIOPHARMACEUTICALS:  7.5 mCi Tc-44m  Choletec IV COMPARISON:  CT done on 05/30/2022, sonogram done on 05/30/2022 FINDINGS: There is prompt entry of tracer into bile ducts. There is activity in duodenum in 15 minutes. Gallbladder is not visualized. There is activity in jejunum in 20 minutes. IMPRESSION: There is no obstruction of common bile duct. There is no demonstrable activity in the gallbladder. Findings suggest possible cystic duct obstruction and cholecystitis. Electronically Signed   By: Ernie Avena M.D.   On: 06/02/2022 15:35   US Abdomen Limited RUQ (LIVER/GB)  Result Date: 05/30/2022 CLINICAL DATA:  Elevated bilirubin EXAM: ULTRASOUND ABDOMEN LIMITED RIGHT UPPER QUADRANT COMPARISON:  CT from earlier in the same day, ultrasound from 02/25/2022. FINDINGS: Gallbladder: Gallbladder is well distended with multiple stones and gallbladder sludge within. Mild wall thickening at 4 mm is noted. No pericholecystic fluid is seen. Negative sonographic Murphy's sign is elicited. Common bile duct: Diameter: 4 mm Liver: 6.4 cm cyst is noted in the right lobe of the liver similar to that seen on the prior exam. Some dependent density is noted within which may be related to prior hemorrhage. No other focal abnormality is noted in the liver. Portal vein is patent on color Doppler imaging with normal direction of blood flow towards the liver. Other: None. IMPRESSION: Gallstones and gallbladder sludge with mild wall thickening. Negative sonographic Murphy's sign is elicited. These changes are  stable from the prior ultrasound exam. Hepatic cyst similar to that seen on recent CT examination. Dependent density is noted within the overall appearance is similar to that seen on prior exam from January of 2024. Electronically Signed   By: Alcide Clever M.D.   On: 05/30/2022 23:01   CT ABDOMEN PELVIS W CONTRAST  Result Date: 05/30/2022 CLINICAL DATA:  Abdominal pain, vomiting, constipation EXAM: CT ABDOMEN AND PELVIS WITH CONTRAST TECHNIQUE: Multidetector CT imaging of the abdomen and pelvis was performed using the standard protocol following bolus administration of intravenous contrast. RADIATION DOSE REDUCTION: This exam was performed according to the departmental dose-optimization program which includes automated exposure control, adjustment of the mA  and/or kV according to patient size and/or use of iterative reconstruction technique. CONTRAST:  75mL OMNIPAQUE IOHEXOL 350 MG/ML SOLN COMPARISON:  01/28/2019 FINDINGS: Lower chest: Small linear densities are seen in lower lung fields suggesting scarring or subsegmental atelectasis. Coronary artery calcifications are seen. Hepatobiliary: There is nodularity in the liver surface. There is 6.4 cm smooth marginated fluid density lesion in the right lobe suggesting cyst. There is layering high density in the dependent portion of gallbladder lumen. There is mild wall thickening in gallbladder. There is stranding in the fat adjacent to the gallbladder. There is no dilation of bile ducts. Pancreas: No focal abnormalities are seen in pancreas. Diverticulum is noted along the inner margin of second portion of duodenum. Spleen: Spleen measures 15.3 cm in maximum diameter. Adrenals/Urinary Tract: Adrenals are unremarkable. There is no hydronephrosis. There is possible 1 mm right renal calculus. Ureters are not dilated. Urinary bladder is not distended. There is mild diffuse wall thickening in the bladder. Stomach/Bowel: Stomach is not distended. Small bowel loops are  not dilated. Appendix is not dilated. Scattered diverticula are seen in colon. There is no evidence of focal acute diverticulitis. Vascular/Lymphatic: Scattered arterial calcifications are seen. There are scattered subcentimeter nodes suggesting reactive hyperplasia. Reproductive: Metallic densities are seen in prostate suggesting previous brachytherapy. Other: There is no ascites or pneumoperitoneum. Small bilateral inguinal hernias containing fat are seen. Musculoskeletal: Degenerative changes are noted in thoracic and lumbar spine with bony spurs. IMPRESSION: There is no evidence of intestinal obstruction or pneumoperitoneum. There is no hydronephrosis. Appendix is not dilated. Gallbladder is distended. There is layering of high density in the dependent portion of gallbladder suggesting presence of sludge and stones. There is mild pericholecystic stranding. Possibility of acute cholecystitis is not excluded. If clinically warranted, follow-up gallbladder sonogram may be considered. There is no dilation of bile ducts. Coronary artery disease. There is a 6.4 cm hepatic cyst. Cirrhosis of liver. Splenomegaly. There is tiny nonobstructing right renal calculus. Scattered diverticula are seen in colon without signs of focal diverticulitis. There is mild diffuse wall thickening in the urinary bladder which may be due to incomplete distention or suggest cystitis. Electronically Signed   By: Ernie Avena M.D.   On: 05/30/2022 20:43   DG Abd 2 Views  Result Date: 05/30/2022 CLINICAL DATA:  Constipation, abdominal pain. EXAM: ABDOMEN - 2 VIEW COMPARISON:  Abdominal radiographs 02/02/2019. Abdominal MRI 08/14/2020. FINDINGS: Supine and erect views (4 total) are submitted. There is a normal, nonobstructive bowel gas pattern. Colonic stool burden does not appear increased. No evidence of pneumoperitoneum, bowel wall thickening or suspicious abdominal calcification. Prostate brachytherapy seeds are noted. There are  mild degenerative changes in the spine. IMPRESSION: No evidence of acute abdominal process. Electronically Signed   By: Carey Bullocks M.D.   On: 05/30/2022 11:32     Discharge Exam: Vitals:   06/03/22 0430 06/03/22 0800  BP: (!) 135/57 (!) 126/53  Pulse: 97 96  Resp: 18 18  Temp: 99.6 F (37.6 C) 98.1 F (36.7 C)  SpO2: 97% 96%   Vitals:   06/02/22 1607 06/02/22 2141 06/03/22 0430 06/03/22 0800  BP: 120/82 (!) 152/59 (!) 135/57 (!) 126/53  Pulse: (!) 101 (!) 102 97 96  Resp: 20 17 18 18   Temp: 99.4 F (37.4 C) 98.2 F (36.8 C) 99.6 F (37.6 C) 98.1 F (36.7 C)  TempSrc: Oral Oral Oral Oral  SpO2: 100% 96% 97% 96%  Weight:      Height:  General: Pt is alert, awake, not in acute distress Cardiovascular: RRR, S1/S2 +, no rubs, no gallops Respiratory: CTA bilaterally, no wheezing, no rhonchi Abdominal: Soft, mild right upper quadrant tenderness, ND, bowel sounds + Extremities: no edema, no cyanosis    The results of significant diagnostics from this hospitalization (including imaging, microbiology, ancillary and laboratory) are listed below for reference.     Microbiology: Recent Results (from the past 240 hour(s))  Urine Culture     Status: None   Collection Time: 05/30/22 12:39 PM   Specimen: Urine, Clean Catch  Result Value Ref Range Status   Specimen Description URINE, CLEAN CATCH  Final   Special Requests NONE  Final   Culture   Final    NO GROWTH Performed at [facility], [address], [place], [place] [address]    Report Status 05/31/2022 FINAL  Final  Blood culture (routine x 2)     Status: Abnormal   Collection Time: 05/30/22  9:20 PM   Specimen: BLOOD RIGHT FOREARM  Result Value Ref Range Status   Specimen Description BLOOD RIGHT FOREARM  Final   Special Requests   Final    BOTTLES DRAWN AEROBIC AND ANAEROBIC Blood Culture adequate volume   Culture  Setup Time   Final    IN BOTH AEROBIC AND ANAEROBIC BOTTLES GRAM NEGATIVE  RODS CRITICAL RESULT CALLED TO, READ BACK BY AND VERIFIED WITH:  C/ PHARMD L. CURRAN 05/31/22 1149 A. LAFRANCE Performed at [facility], [address], [place], [place] [address]    Culture ESCHERICHIA COLI (A)  Final   Report Status 06/02/2022 FINAL  Final   Organism ID, Bacteria ESCHERICHIA COLI  Final      Susceptibility   Escherichia coli - MIC*    AMPICILLIN >=32 RESISTANT Resistant     CEFEPIME <=0.12 SENSITIVE Sensitive     CEFTAZIDIME <=1 SENSITIVE Sensitive     CEFTRIAXONE <=0.25 SENSITIVE Sensitive     CIPROFLOXACIN <=0.25 SENSITIVE Sensitive     GENTAMICIN <=1 SENSITIVE Sensitive     IMIPENEM <=0.25 SENSITIVE Sensitive     TRIMETH/SULFA <=20 SENSITIVE Sensitive     AMPICILLIN/SULBACTAM 16 INTERMEDIATE Intermediate     PIP/TAZO <=4 SENSITIVE Sensitive     * ESCHERICHIA COLI  Blood Culture ID Panel (Reflexed)     Status: Abnormal   Collection Time: 05/30/22  9:20 PM  Result Value Ref Range Status   Enterococcus faecalis NOT DETECTED NOT DETECTED Final   Enterococcus Faecium NOT DETECTED NOT DETECTED Final   Listeria monocytogenes NOT DETECTED NOT DETECTED Final   Staphylococcus species NOT DETECTED NOT DETECTED Final   Staphylococcus aureus (BCID) NOT DETECTED NOT DETECTED Final   Staphylococcus epidermidis NOT DETECTED NOT DETECTED Final   Staphylococcus lugdunensis NOT DETECTED NOT DETECTED Final   Streptococcus species NOT DETECTED NOT DETECTED Final   Streptococcus agalactiae NOT DETECTED NOT DETECTED Final   Streptococcus pneumoniae NOT DETECTED NOT DETECTED Final   Streptococcus pyogenes NOT DETECTED NOT DETECTED Final   A.calcoaceticus-baumannii NOT DETECTED NOT DETECTED Final   Bacteroides fragilis NOT DETECTED NOT DETECTED Final   Enterobacterales DETECTED (A) NOT DETECTED Final    Comment: Enterobacterales represent a large order of gram negative bacteria, not a single organism. CRITICAL RESULT CALLED TO, READ BACK BY AND VERIFIED WITH:  C/ PHARMD L.  CURRAN 05/31/22 1149 A. LAFRANCE    Enterobacter cloacae complex NOT DETECTED NOT DETECTED Final   Escherichia coli DETECTED (A) NOT DETECTED Final  Comment: CRITICAL RESULT CALLED TO, READ BACK BY AND VERIFIED WITH:  C/ PHARMD L. CURRAN 05/31/22 1149 A. LAFRANCE    Klebsiella aerogenes NOT DETECTED NOT DETECTED Final   Klebsiella oxytoca NOT DETECTED NOT DETECTED Final   Klebsiella pneumoniae NOT DETECTED NOT DETECTED Final   Proteus species NOT DETECTED NOT DETECTED Final   Salmonella species NOT DETECTED NOT DETECTED Final   Serratia marcescens NOT DETECTED NOT DETECTED Final   Haemophilus influenzae NOT DETECTED NOT DETECTED Final   Neisseria meningitidis NOT DETECTED NOT DETECTED Final   Pseudomonas aeruginosa NOT DETECTED NOT DETECTED Final   Stenotrophomonas maltophilia NOT DETECTED NOT DETECTED Final   Candida albicans NOT DETECTED NOT DETECTED Final   Candida auris NOT DETECTED NOT DETECTED Final   Candida glabrata NOT DETECTED NOT DETECTED Final   Candida krusei NOT DETECTED NOT DETECTED Final   Candida parapsilosis NOT DETECTED NOT DETECTED Final   Candida tropicalis NOT DETECTED NOT DETECTED Final   Cryptococcus neoformans/gattii NOT DETECTED NOT DETECTED Final   CTX-M ESBL NOT DETECTED NOT DETECTED Final   Carbapenem resistance IMP NOT DETECTED NOT DETECTED Final   Carbapenem resistance KPC NOT DETECTED NOT DETECTED Final   Carbapenem resistance NDM NOT DETECTED NOT DETECTED Final   Carbapenem resist OXA 48 LIKE NOT DETECTED NOT DETECTED Final   Carbapenem resistance VIM NOT DETECTED NOT DETECTED Final    Comment: Performed at [facility], [address], [place], [place] [address]  Blood culture (routine x 2)     Status: None (Preliminary result)   Collection Time: 05/30/22  9:28 PM   Specimen: BLOOD LEFT FOREARM  Result Value Ref Range Status   Specimen Description BLOOD LEFT FOREARM  Final   Special Requests   Final    BOTTLES DRAWN AEROBIC AND ANAEROBIC  Blood Culture results may not be optimal due to an excessive volume of blood received in culture bottles   Culture   Final    NO GROWTH 4 DAYS Performed at [facility], [address], [place], [place] [address]    Report Status PENDING  Incomplete     Labs: BNP (last 3 results) No results for input(s): "BNP" in the last 8760 hours. Basic Metabolic Panel: Recent Labs  Lab 05/30/22 1251 05/31/22 0109 06/01/22 0149 06/02/22 0053 06/03/22 0319  NA 132* 131* 127* 128* 130*  K 4.3 3.6 3.4* 3.6 3.4*  CL 94* 99 95* 94* 94*  CO2 24 21* 24 25 24   GLUCOSE 317* 290* 316* 227* 190*  BUN 15 13 18 15 13   CREATININE 1.05 1.08 1.04 1.20 1.07  CALCIUM 8.8* 8.4* 7.9* 7.9* 8.1*  MG  --  1.2*  --   --   --    Liver Function Tests: Recent Labs  Lab 05/30/22 1251 05/31/22 0109 06/01/22 0149 06/02/22 0053 06/03/22 0319  AST 32 22 15 18 18   ALT 36 27 19 17 15   ALKPHOS 121 101 93 92 93  BILITOT 5.6* 4.7* 2.9* 2.5* 1.7*  PROT 6.9 6.0* 5.9* 5.9* 5.8*  ALBUMIN 3.3* 2.8* 2.6* 2.4* 2.4*   Recent Labs  Lab 05/30/22 1251  LIPASE 30   No results for input(s): "AMMONIA" in the last 168 hours. CBC: Recent Labs  Lab 05/30/22 1251 05/31/22 0109 06/01/22 0149 06/02/22 0053 06/03/22 0319  WBC 8.8 9.4 9.7 11.4* 10.2  NEUTROABS 5.8 6.4 6.6 7.3 5.5  HGB 14.3 13.0 12.6* 12.5* 12.8*  HCT 41.2 37.1* 35.6* 34.8* 35.7*  MCV 95.6  93.0 93.0 93.0 91.3  PLT 81* 73* 77* 82* 121*   Cardiac Enzymes: No results for input(s): "CKTOTAL", "CKMB", "CKMBINDEX", "TROPONINI" in the last 168 hours. BNP: Invalid input(s): "POCBNP" CBG: Recent Labs  Lab 06/02/22 1108 06/02/22 1606 06/02/22 2126 06/03/22 0758 06/03/22 1159  GLUCAP 192* 208* 238* 163* 312*   D-Dimer No results for input(s): "DDIMER" in the last 72 hours. Hgb A1c No results for input(s): "HGBA1C" in the last 72 hours. Lipid Profile No results for input(s): "CHOL", "HDL", "LDLCALC", "TRIG", "CHOLHDL", "LDLDIRECT" in the last 72  hours. Thyroid function studies No results for input(s): "TSH", "T4TOTAL", "T3FREE", "THYROIDAB" in the last 72 hours.  Invalid input(s): "FREET3" Anemia work up No results for input(s): "VITAMINB12", "FOLATE", "FERRITIN", "TIBC", "IRON", "RETICCTPCT" in the last 72 hours. Urinalysis    Component Value Date/Time   COLORURINE AMBER (A) 05/30/2022 1526   APPEARANCEUR CLEAR 05/30/2022 1526   LABSPEC 1.022 05/30/2022 1526   PHURINE 5.0 05/30/2022 1526   GLUCOSEU >=500 (A) 05/30/2022 1526   GLUCOSEU >=1000 (A) 05/07/2022 1454   HGBUR MODERATE (A) 05/30/2022 1526   BILIRUBINUR NEGATIVE 05/30/2022 1526   BILIRUBINUR moderate (A) 05/30/2022 1228   BILIRUBINUR Positive 11/11/2017 1037   KETONESUR 80 (A) 05/30/2022 1526   KETONESUR >= (160) (A) 05/30/2022 1228   PROTEINUR 100 (A) 05/30/2022 1526   PROTEINUR =100 (A) 05/30/2022 1228   UROBILINOGEN 1.0 05/30/2022 1228   UROBILINOGEN 4.0 (A) 05/07/2022 1454   NITRITE NEGATIVE 05/30/2022 1526   NITRITE Negative 05/30/2022 1228   LEUKOCYTESUR NEGATIVE 05/30/2022 1526   LEUKOCYTESUR Negative 05/30/2022 1228   Sepsis Labs Recent Labs  Lab 05/31/22 0109 06/01/22 0149 06/02/22 0053 06/03/22 0319  WBC 9.4 9.7 11.4* 10.2   Microbiology Recent Results (from the past 240 hour(s))  Urine Culture     Status: None   Collection Time: 05/30/22 12:39 PM   Specimen: Urine, Clean Catch  Result Value Ref Range Status   Specimen Description URINE, CLEAN CATCH  Final   Special Requests NONE  Final   Culture   Final    NO GROWTH Performed at [facility], [address] [place], [place] [address]    Report Status 05/31/2022 FINAL  Final  Blood culture (routine x 2)     Status: Abnormal   Collection Time: 05/30/22  9:20 PM   Specimen: BLOOD RIGHT FOREARM  Result Value Ref Range Status   Specimen Description BLOOD RIGHT FOREARM  Final   Special Requests   Final    BOTTLES DRAWN AEROBIC AND ANAEROBIC Blood Culture adequate volume    Culture  Setup Time   Final    IN BOTH AEROBIC AND ANAEROBIC BOTTLES GRAM NEGATIVE RODS CRITICAL RESULT CALLED TO, READ BACK BY AND VERIFIED WITH:  C/ PHARMD L. CURRAN 05/31/22 1149 A. LAFRANCE Performed at [facility], [address] [place], [place] [address]    Culture ESCHERICHIA COLI (A)  Final   Report Status 06/02/2022 FINAL  Final   Organism ID, Bacteria ESCHERICHIA COLI  Final      Susceptibility   Escherichia coli - MIC*    AMPICILLIN >=32 RESISTANT Resistant     CEFEPIME <=0.12 SENSITIVE Sensitive     CEFTAZIDIME <=1 SENSITIVE Sensitive     CEFTRIAXONE <=0.25 SENSITIVE Sensitive     CIPROFLOXACIN <=0.25 SENSITIVE Sensitive     GENTAMICIN <=1 SENSITIVE Sensitive     IMIPENEM <=0.25 SENSITIVE Sensitive     TRIMETH/SULFA <=20 SENSITIVE Sensitive  AMPICILLIN/SULBACTAM 16 INTERMEDIATE Intermediate     PIP/TAZO <=4 SENSITIVE Sensitive     * ESCHERICHIA COLI  Blood Culture ID Panel (Reflexed)     Status: Abnormal   Collection Time: 05/30/22  9:20 PM  Result Value Ref Range Status   Enterococcus faecalis NOT DETECTED NOT DETECTED Final   Enterococcus Faecium NOT DETECTED NOT DETECTED Final   Listeria monocytogenes NOT DETECTED NOT DETECTED Final   Staphylococcus species NOT DETECTED NOT DETECTED Final   Staphylococcus aureus (BCID) NOT DETECTED NOT DETECTED Final   Staphylococcus epidermidis NOT DETECTED NOT DETECTED Final   Staphylococcus lugdunensis NOT DETECTED NOT DETECTED Final   Streptococcus species NOT DETECTED NOT DETECTED Final   Streptococcus agalactiae NOT DETECTED NOT DETECTED Final   Streptococcus pneumoniae NOT DETECTED NOT DETECTED Final   Streptococcus pyogenes NOT DETECTED NOT DETECTED Final   A.calcoaceticus-baumannii NOT DETECTED NOT DETECTED Final   Bacteroides fragilis NOT DETECTED NOT DETECTED Final   Enterobacterales DETECTED (A) NOT DETECTED Final    Comment: Enterobacterales represent a large order of gram negative bacteria, not a single  organism. CRITICAL RESULT CALLED TO, READ BACK BY AND VERIFIED WITH:  C/ PHARMD L. CURRAN 05/31/22 1149 A. LAFRANCE    Enterobacter cloacae complex NOT DETECTED NOT DETECTED Final   Escherichia coli DETECTED (A) NOT DETECTED Final    Comment: CRITICAL RESULT CALLED TO, READ BACK BY AND VERIFIED WITH:  C/ PHARMD L. CURRAN 05/31/22 1149 A. LAFRANCE    Klebsiella aerogenes NOT DETECTED NOT DETECTED Final   Klebsiella oxytoca NOT DETECTED NOT DETECTED Final   Klebsiella pneumoniae NOT DETECTED NOT DETECTED Final   Proteus species NOT DETECTED NOT DETECTED Final   Salmonella species NOT DETECTED NOT DETECTED Final   Serratia marcescens NOT DETECTED NOT DETECTED Final   Haemophilus influenzae NOT DETECTED NOT DETECTED Final   Neisseria meningitidis NOT DETECTED NOT DETECTED Final   Pseudomonas aeruginosa NOT DETECTED NOT DETECTED Final   Stenotrophomonas maltophilia NOT DETECTED NOT DETECTED Final   Candida albicans NOT DETECTED NOT DETECTED Final   Candida auris NOT DETECTED NOT DETECTED Final   Candida glabrata NOT DETECTED NOT DETECTED Final   Candida krusei NOT DETECTED NOT DETECTED Final   Candida parapsilosis NOT DETECTED NOT DETECTED Final   Candida tropicalis NOT DETECTED NOT DETECTED Final   Cryptococcus neoformans/gattii NOT DETECTED NOT DETECTED Final   CTX-M ESBL NOT DETECTED NOT DETECTED Final   Carbapenem resistance IMP NOT DETECTED NOT DETECTED Final   Carbapenem resistance KPC NOT DETECTED NOT DETECTED Final   Carbapenem resistance NDM NOT DETECTED NOT DETECTED Final   Carbapenem resist OXA 48 LIKE NOT DETECTED NOT DETECTED Final   Carbapenem resistance VIM NOT DETECTED NOT DETECTED Final    Comment: Performed at [facility], [address], [place], [place] [address]  Blood culture (routine x 2)     Status: None (Preliminary result)   Collection Time: 05/30/22  9:28 PM   Specimen: BLOOD LEFT FOREARM  Result Value Ref Range Status   Specimen Description BLOOD  LEFT FOREARM  Final   Special Requests   Final    BOTTLES DRAWN AEROBIC AND ANAEROBIC Blood Culture results may not be optimal due to an excessive volume of blood received in culture bottles   Culture   Final    NO GROWTH 4 DAYS Performed at [facility], [address] [place], [place] [address]    Report Status PENDING  Incomplete     Time coordinating discharge:  Over 30 minutes  SIGNED:   Hughie Closs, MD  [facility] 06/03/2022, 1:01 PM *Please note that this is a verbal dictation therefore any spelling or grammatical errors are due to the "Dragon Medical One" system interpretation. If 7PM-7AM, please contact night-coverage [email or web address]

## 2022-06-03 NOTE — Progress Notes (Signed)
OT Cancellation Note  Patient Details Name: Patrick Kidd MRN: [id number] DOB: 12/03/1945   Cancelled Treatment:    Reason Eval/Treat Not Completed: OT screened, no needs identified, will sign off (Pt independently dressed and packed up bags upon arrival to prepare for discharge, no OT needs, will sign off. Thank you.)  Donia Pounds 06/03/2022, 1:29 PM

## 2022-06-04 ENCOUNTER — Encounter: Payer: Self-pay | Admitting: *Deleted

## 2022-06-04 ENCOUNTER — Ambulatory Visit: Payer: Self-pay

## 2022-06-04 ENCOUNTER — Telehealth: Payer: Self-pay | Admitting: *Deleted

## 2022-06-04 LAB — CULTURE, BLOOD (ROUTINE X 2): Culture: NO GROWTH

## 2022-06-04 NOTE — Chronic Care Management (AMB) (Addendum)
   06/04/2022  TREYLON REHOR 04/04/1945 [id number]  Reason for Encounter: Patient is not currently enrolled in the CCM program. CCM status changed to previously enrolled  Alto Denver RN, MSN, CCM RN Care Manager  Chronic Care Management Direct Number: [phone]

## 2022-06-04 NOTE — Transitions of Care (Post Inpatient/ED Visit) (Signed)
   06/04/2022  Name: Patrick Kidd MRN: [id number] DOB: [birth year]  Today's TOC FU Call Status: Today's TOC FU Call Status:: Unsuccessul Call (1st Attempt) Unsuccessful Call (1st Attempt) Date: 06/04/22  Attempted to reach the patient regarding the most recent Inpatient visit; left HIPAA compliant voice message requesting call back  Follow Up Plan: Additional outreach attempts will be made to reach the patient to complete the Transitions of Care (Post Inpatient visit) call.   Caryl Pina, RN, BSN, CCRN Alumnus RN CM Care Coordination/ Transition of Care- [facility] Care Management [phone]: direct office

## 2022-06-05 ENCOUNTER — Telehealth: Payer: Self-pay | Admitting: *Deleted

## 2022-06-05 ENCOUNTER — Encounter: Payer: Self-pay | Admitting: *Deleted

## 2022-06-05 NOTE — Telephone Encounter (Signed)
I do not recommend probiotics in this situation

## 2022-06-05 NOTE — Telephone Encounter (Signed)
Patient made aware of MD recommendations & verbalized all understanding.

## 2022-06-05 NOTE — Telephone Encounter (Signed)
Patient called in stating he was seen in ED last week & was prescribed cipro and flagyl for 7 days d/t acute cholecystitis. He would like Dr. Marvell Fuller opinion on whether or not he should be taking a probiotic while on antibiotics & if so which one he would recommend. Denies any current GI upset, n/v. Will route to MD.

## 2022-06-05 NOTE — Telephone Encounter (Signed)
Patient called regarding the patient recently being at the ED, said they gave him two antibiotic medications and he would like a generic probiotic medication sent for him as well. Please advise.

## 2022-06-05 NOTE — Transitions of Care (Post Inpatient/ED Visit) (Signed)
06/05/2022  Name: Patrick Kidd MRN: [id number] DOB: March 17, 1945  Today's TOC FU Call Status: Today's TOC FU Call Status:: Successful TOC FU Call Competed TOC FU Call Complete Date: 06/05/22  Transition Care Management Follow-up Telephone Call Date of Discharge: 06/03/22 Discharge Facility: [facility]) Type of Discharge: Inpatient Admission Primary Inpatient Discharge Diagnosis:: Abdominal pain/ acute cholecystitis with gallbladder sludge How have you been since you were released from the hospital?: Better ("Feeling better overall; still tired and little weak, but not too bad.  I am taking the antibiotics; my wife handles all of my medicines.  Going to see the surgeon here in a few minutes today") Any questions or concerns?: No  Items Reviewed: Did you receive and understand the discharge instructions provided?: Yes (thoroughly reviewed with patient who verbalizes good understanding of same) Medications obtained,verified, and reconciled?: Yes (Medications Reviewed) (Partial medication review completed; patient declined full review; confirmed patient obtained/ is taking all newly Rx'd medications as instructed; spouse-manages medications and pt. denies questions/ concerns around medications today) Any new allergies since your discharge?: No Dietary orders reviewed?: Yes Type of Diet Ordered:: low fat Do you have support at home?: Yes People in Home: spouse Name of Support/Comfort Primary Source: Reports independent in self-care activities; spouse assists as/ if needed/ indicated  Medications Reviewed Today: Medications Reviewed Today     Reviewed by Michaela Corner, RN (Registered Nurse) on 06/05/22 at 1108  Med List Status: <None>   Medication Order Taking? Sig Documenting Provider Last Dose Status Informant  acetaminophen (TYLENOL) 325 MG tablet [id number]  Take 650 mg by mouth daily as needed for moderate pain. [provider]  Active Self  ciprofloxacin (CIPRO) 500  MG tablet [id number]  Take 1 tablet (500 mg total) by mouth 2 (two) times daily for 7 days. Hughie Closs, MD  Active   Continuous Blood Gluc Receiver (FREESTYLE LIBRE 3 READER) DEVI [id number]  1 Act by Does not apply route daily. Etta Grandchild, MD  Active Self  Continuous Blood Gluc Sensor (FREESTYLE LIBRE 3 SENSOR) [place] [id number]  1 Act by Does not apply route daily. Place 1 sensor on the skin every 14 days. Use to check glucose continuously Etta Grandchild, MD  Active Self  gabapentin (NEURONTIN) 300 MG capsule [id number]  Take 1 capsule (300 mg total) by mouth 3 (three) times daily.  Patient taking differently: Take 300 mg by mouth 2 (two) times daily.   Etta Grandchild, MD  Active Self  insulin glargine, 2 Unit Dial, (TOUJEO MAX SOLOSTAR) 300 UNIT/ML Solostar Pen [id number]  Inject 50 Units into the skin daily. Via SANOFI pt assistance Etta Grandchild, MD  Active Self  Insulin Pen Needle (PEN NEEDLES) 32G X 5 MM MISC [id number]  Inject 1 Act into the skin daily. Use to administer insulin. Dx E11.9 Dispense based on insurance preference. Etta Grandchild, MD  Active Self  Lancets (ACCU-CHEK MULTICLIX) lancets [id number]  Use to check sugar daily. DX: E11.8 Etta Grandchild, MD  Active Self  metFORMIN (GLUCOPHAGE) 500 MG tablet [id number]  Take 1 tablet (500 mg total) by mouth 2 (two) times daily with a meal. Etta Grandchild, MD  Active Self  metroNIDAZOLE (FLAGYL) 500 MG tablet [id number]  Take 1 tablet (500 mg total) by mouth 3 (three) times daily for 7 days. Hughie Closs, MD  Active   pantoprazole (PROTONIX) 40 MG tablet [id number]  TAKE 1 TABLET 30 MINUTES BEFORE MEALS (BREAKFAST AND SUPPER) TWICE A DAY  Patient taking differently: Take 40 mg by mouth 2 (two) times daily. TAKE 1 TABLET 30 MINUTES BEFORE MEALS (BREAKFAST AND SUPPER) TWICE A DAY   Etta Grandchild, MD  Active Self            Home Care and Equipment/Supplies: Were Home Health Services Ordered?: No Any new equipment or medical  supplies ordered?: No  Functional Questionnaire: Do you need assistance with bathing/showering or dressing?: No Do you need assistance with meal preparation?: No Do you need assistance with eating?: No Do you have difficulty maintaining continence: No Do you need assistance with getting out of bed/getting out of a chair/moving?: No Do you have difficulty managing or taking your medications?: Yes (patient reports spouse manages all aspects of medication administration)  Follow up appointments reviewed: PCP Follow-up appointment confirmed?: Yes Date of PCP follow-up appointment?: 06/10/22 Follow-up Provider: PCP Specialist Hospital Follow-up appointment confirmed?: Yes Date of Specialist follow-up appointment?: 06/05/22 Follow-Up Specialty Provider:: surgeon- [facility] Do you need transportation to your follow-up appointment?: No Do you understand care options if your condition(s) worsen?: Yes-patient verbalized understanding  SDOH Interventions Today    Flowsheet Row Most Recent Value  SDOH Interventions   Food Insecurity Interventions Intervention Not Indicated  Transportation Interventions Intervention Not Indicated  [normally drives self,  wife assists as needed after recent hospitalization]      TOC Interventions Today    Flowsheet Row Most Recent Value  TOC Interventions   TOC Interventions Discussed/Reviewed TOC Interventions Discussed  [Patient declines need for ongoing/ further care coordination outreach,  no care coordination needs identified at time of TOC call today,  patient decines taking my direct phone number for future use]      Interventions Today    Flowsheet Row Most Recent Value  Chronic Disease   Chronic disease during today's visit Other  [acute cholecystitis/ gallbladder sludge]  General Interventions   General Interventions Discussed/Reviewed General Interventions Discussed, Doctor Visits, Durable Medical Equipment (DME)  Doctor Visits Discussed/Reviewed  Doctor Visits Discussed, Specialist, PCP  Durable Medical Equipment (DME) Dan Humphreys, Wheelchair  [confirmed has cane, walker, wheelchair but not currently needing/ using]  Wheelchair Standard  PCP/Specialist Visits Compliance with follow-up visit  Education Interventions   Education Provided Provided Education  Provided Verbal Education On Medication  [potential benefits of probiotics in setting of extended antibiotic therapy]  Nutrition Interventions   Nutrition Discussed/Reviewed Nutrition Discussed  Pharmacy Interventions   Pharmacy Dicussed/Reviewed Pharmacy Topics Discussed  Safety Interventions   Safety Discussed/Reviewed Safety Discussed      Caryl Pina, RN, BSN, CCRN Alumnus RN CM Care Coordination/ Transition of Care- [facility] Care Management [phone]: direct office

## 2022-06-10 ENCOUNTER — Encounter: Payer: Self-pay | Admitting: Internal Medicine

## 2022-06-10 ENCOUNTER — Ambulatory Visit (INDEPENDENT_AMBULATORY_CARE_PROVIDER_SITE_OTHER): Payer: Medicare Other | Admitting: Internal Medicine

## 2022-06-10 VITALS — BP 138/60 | HR 93 | Temp 97.8°F | Ht 74.0 in | Wt 225.0 lb

## 2022-06-10 DIAGNOSIS — B9620 Unspecified Escherichia coli [E. coli] as the cause of diseases classified elsewhere: Secondary | ICD-10-CM | POA: Diagnosis not present

## 2022-06-10 DIAGNOSIS — R7881 Bacteremia: Secondary | ICD-10-CM

## 2022-06-10 DIAGNOSIS — K7030 Alcoholic cirrhosis of liver without ascites: Secondary | ICD-10-CM | POA: Diagnosis not present

## 2022-06-10 DIAGNOSIS — N1831 Chronic kidney disease, stage 3a: Secondary | ICD-10-CM

## 2022-06-10 DIAGNOSIS — K828 Other specified diseases of gallbladder: Secondary | ICD-10-CM

## 2022-06-10 LAB — HEPATIC FUNCTION PANEL
ALT: 11 U/L (ref 0–53)
AST: 21 U/L (ref 0–37)
Albumin: 3.3 g/dL — ABNORMAL LOW (ref 3.5–5.2)
Alkaline Phosphatase: 232 U/L — ABNORMAL HIGH (ref 39–117)
Bilirubin, Direct: 0.5 mg/dL — ABNORMAL HIGH (ref 0.0–0.3)
Total Bilirubin: 1.2 mg/dL (ref 0.2–1.2)
Total Protein: 6.2 g/dL (ref 6.0–8.3)

## 2022-06-10 LAB — BASIC METABOLIC PANEL
BUN: 8 mg/dL (ref 6–23)
CO2: 30 mEq/L (ref 19–32)
Calcium: 9.1 mg/dL (ref 8.4–10.5)
Chloride: 99 mEq/L (ref 96–112)
Creatinine, Ser: 1.02 mg/dL (ref 0.40–1.50)
GFR: 70.83 mL/min (ref >60.00)
Glucose, Bld: 185 mg/dL — ABNORMAL HIGH (ref 70–99)
Potassium: 4.5 mEq/L (ref 3.5–5.1)
Sodium: 137 mEq/L (ref 135–145)

## 2022-06-10 LAB — CBC
HCT: 39.6 % (ref 39.0–52.0)
Hemoglobin: 13.7 g/dL (ref 13.0–17.0)
MCHC: 34.4 g/dL (ref 30.0–36.0)
MCV: 93.8 fl (ref 78.0–100.0)
Platelets: 298 10*3/uL (ref 150.0–400.0)
RBC: 4.22 Mil/uL (ref 4.22–5.81)
RDW: 13.3 % (ref 11.5–15.5)
WBC: 8 10*3/uL (ref 4.0–10.5)

## 2022-06-10 LAB — PROTIME-INR
INR: 1.3 ratio — ABNORMAL HIGH (ref 0.8–1.0)
Prothrombin Time: 13.5 s — ABNORMAL HIGH (ref 9.6–13.1)

## 2022-06-10 NOTE — Patient Instructions (Signed)
We will check the labs.    

## 2022-06-10 NOTE — Progress Notes (Signed)
   Subjective:   Patient ID: Patrick Kidd, male    DOB: 07/02/1945, 77 y.o.   MRN: [id number]  HPI The patient is a 77 YO man coming in for hospital follow up (in for acute cholecystitis, not deemed operative candidate treated with antibiotics, concurrent cirrhosis). He is eating okay since being home. Finished antibiotics and is feeling improved. Some mild pain in the stomach. No diarrhea or constipation. No chest pains or SOB. No fevers or chills.   PMH, [facility], social history reviewed and updated.   Review of Systems  Constitutional:  Positive for fatigue.  HENT: Negative.    Eyes: Negative.   Respiratory:  Negative for cough, chest tightness and shortness of breath.   Cardiovascular:  Negative for chest pain, palpitations and leg swelling.  Gastrointestinal:  Positive for abdominal pain. Negative for abdominal distention, constipation, diarrhea, nausea and vomiting.  Musculoskeletal: Negative.   Skin: Negative.   Neurological: Negative.   Psychiatric/Behavioral: Negative.      Objective:  Physical Exam Constitutional:      Appearance: He is well-developed. He is ill-appearing.  HENT:     Head: Normocephalic and atraumatic.  Cardiovascular:     Rate and Rhythm: Normal rate and regular rhythm.  Pulmonary:     Effort: Pulmonary effort is normal. No respiratory distress.     Breath sounds: Normal breath sounds. No wheezing or rales.  Abdominal:     General: Bowel sounds are normal. There is distension.     Palpations: Abdomen is soft.     Tenderness: There is abdominal tenderness. There is no rebound.  Musculoskeletal:     Cervical back: Normal range of motion.  Skin:    General: Skin is warm and dry.  Neurological:     Mental Status: He is alert and oriented to person, place, and time.     Coordination: Coordination normal.     Vitals:   06/10/22 0929  BP: 138/60  Pulse: 93  Temp: 97.8 F (36.6 C)  TempSrc: Oral  SpO2: 95%  Weight: 225 lb (102.1 kg)  Height:  6\' 2"  (1.88 m)    Assessment & Plan:  Visit time 25 minutes in face to face communication with patient and coordination of care, additional 15 minutes spent in record review, coordination or care, ordering tests, communicating/referring to other healthcare professionals, documenting in medical records all on the same day of the visit for total time 40 minutes spent on the visit.

## 2022-06-13 NOTE — Assessment & Plan Note (Signed)
We did discuss that he is at higher than average risk for recurrence or only partial clearance of his cholecystitis but he was not deemed surgical candidate at this time.

## 2022-06-13 NOTE — Assessment & Plan Note (Signed)
Checking BMP for stability.  

## 2022-06-13 NOTE — Assessment & Plan Note (Signed)
Had fully resolved on future blood cultures and was related to acute cholecystitis. No signs of sepsis today.

## 2022-06-13 NOTE — Assessment & Plan Note (Signed)
Some abdominal distention and mild tenderness but improving. Checking Pt/INR and hepatic function and BMP today and CBC. No signs on encephalopathy. Had long discussion with wife and patient. He does still drink non-alcoholic beers. I let her know that legally these can still have up to 0.5% alcohol and can contain alcohol which can damage his liver. Her wife states she only buys ones which are 0.0% labeled. I agree with this but still advised her legally to carry the label they can contain up to 0.5% alcohol and I am uncertain how truthful those labels on NAB have to be so I am unable to confidently agree that 0.0 labeled NAB have no alcohol in them. Seeing GI for monitoring.

## 2022-06-18 ENCOUNTER — Ambulatory Visit (INDEPENDENT_AMBULATORY_CARE_PROVIDER_SITE_OTHER): Payer: Medicare Other | Admitting: Internal Medicine

## 2022-06-18 ENCOUNTER — Encounter: Payer: Self-pay | Admitting: Internal Medicine

## 2022-06-18 VITALS — BP 110/60 | HR 79 | Ht 74.0 in | Wt 221.4 lb

## 2022-06-18 DIAGNOSIS — K8000 Calculus of gallbladder with acute cholecystitis without obstruction: Secondary | ICD-10-CM

## 2022-06-18 DIAGNOSIS — K7030 Alcoholic cirrhosis of liver without ascites: Secondary | ICD-10-CM

## 2022-06-18 NOTE — Progress Notes (Addendum)
Patrick Kidd 76 y.o. 03/11/1945 [id number]  Assessment & Plan:   Encounter Diagnoses  Name Primary?   Alcoholic cirrhosis, unspecified whether ascites present (HCC) Yes   Calculus of gallbladder with acute cholecystitis without obstruction    He has done well with antibiotic treatment of cholecystitis.  Should things recur he is to present to the emergency room if there are more minor problems he should contact me.  He is at higher risk of perioperative complications so it would best to avoid cholecystectomy if possible.  I do think he would be a candidate for percutaneous cholecystostomy tube.  He need his an EGD in the fall for variceal screening surveillance.  He has not been on beta-blocker due to prior history of SBP.  Those recommendations have been modified we could consider carvedilol. - 12/03/2022 though ? If appropriate in WPW   Repeat liver imaging 1 year since he had a CT scan he can go a year before his next ultrasound.  Low-fat diet, ensure adequate protein.  Handouts given.  He is currently a Marjo Bicker A 6 points   Subjective:  GI problem summary:   Cirrhosis (EtOH plus or minus NASH) clinical diagnosis 2017 with history of SBP (01/23/2019) and hepatic encephalopathy No history of varices last EGD 06/22/2020 Ascites did resolve Stopped lactulose 2022 Large right hepatic cyst stable Rectal Varices colonoscopy 2021   History of GERD and distal esophageal stenosis dilated 2017   History of colon polyps-multiple adenomas 2017 2 diminutive polyps 2021 1 adenoma 1 hyperplastic recall suspended due to age   Cholecystitis  - 4/26-30 Admit and Tx abx  Chief Complaint: cirrhosis and f/u aftercholecystitis  HPI  77 year old male history of alcoholic cirrhosis, CLL, diabetes, hypertension, peripheral neuropathy, morbid obesity, history of Wolff-Parkinson-White syndrome, prior history of prostate cancer admitted recently and diagnosed w/ acute cholecystitis by HIDA,  Tx abx and deemed hi-risk surgical candidate  Lastseen here 10/2021  CLINICAL DATA:  Abdominal pain, gallbladder stones   EXAM: NUCLEAR MEDICINE HEPATOBILIARY IMAGING   TECHNIQUE: Sequential images of the abdomen were obtained out to 60 minutes following intravenous administration of radiopharmaceutical.   RADIOPHARMACEUTICALS:  7.5 mCi Tc-47m  Choletec IV   COMPARISON:  CT done on 05/30/2022, sonogram done on 05/30/2022   FINDINGS: There is prompt entry of tracer into bile ducts. There is activity in duodenum in 15 minutes. Gallbladder is not visualized. There is activity in jejunum in 20 minutes.   IMPRESSION: There is no obstruction of common bile duct. There is no demonstrable activity in the gallbladder. Findings suggest possible cystic duct obstruction and cholecystitis.     Electronically Signed   By: Ernie Avena M.D.   On: 06/02/2022 15:35  IMPRESSION: There is no evidence of intestinal obstruction or pneumoperitoneum. There is no hydronephrosis. Appendix is not dilated.   Gallbladder is distended. There is layering of high density in the dependent portion of gallbladder suggesting presence of sludge and stones. There is mild pericholecystic stranding. Possibility of acute cholecystitis is not excluded. If clinically warranted, follow-up gallbladder sonogram may be considered. There is no dilation of bile ducts.   Coronary artery disease. There is a 6.4 cm hepatic cyst. Cirrhosis of liver. Splenomegaly. There is tiny nonobstructing right renal calculus. Scattered diverticula are seen in colon without signs of focal diverticulitis. There is mild diffuse wall thickening in the urinary bladder which may be due to incomplete distention or suggest cystitis.     Electronically Signed   By: Ernie Avena  M.D.   On: 05/30/2022 20:43 IMPRESSION: Gallstones and gallbladder sludge with mild wall thickening. Negative sonographic Murphy's sign is  elicited. These changes are stable from the prior ultrasound exam.   Hepatic cyst similar to that seen on recent CT examination. Dependent density is noted within the overall appearance is similar to that seen on prior exam from January of 2024.     Electronically Signed   By: Alcide Clever M.D.   On: 05/30/2022 23:01 Allergies  Allergen Reactions   Rifaximin Nausea And Vomiting    Required EMS visit, although patient did not go to hospital   Beta Adrenergic Blockers     Likely WPW.  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  Use with caution.   Current Meds  Medication Sig   acetaminophen (TYLENOL) 325 MG tablet Take 650 mg by mouth daily as needed for moderate pain.   Continuous Blood Gluc Receiver (FREESTYLE LIBRE 3 READER) DEVI 1 Act by Does not apply route daily.   Continuous Blood Gluc Sensor (FREESTYLE LIBRE 3 SENSOR) MISC 1 Act by Does not apply route daily. Place 1 sensor on the skin every 14 days. Use to check glucose continuously   gabapentin (NEURONTIN) 300 MG capsule Take 1 capsule (300 mg total) by mouth 3 (three) times daily. (Patient taking differently: Take 300 mg by mouth 2 (two) times daily.)   insulin glargine, 2 Unit Dial, (TOUJEO MAX SOLOSTAR) 300 UNIT/ML Solostar Pen Inject 50 Units into the skin daily. Via SANOFI pt assistance   Insulin Pen Needle (PEN NEEDLES) 32G X 5 MM MISC Inject 1 Act into the skin daily. Use to administer insulin. Dx E11.9 Dispense based on insurance preference.   Lancets (ACCU-CHEK MULTICLIX) lancets Use to check sugar daily. DX: E11.8   metFORMIN (GLUCOPHAGE) 500 MG tablet Take 1 tablet (500 mg total) by mouth 2 (two) times daily with a meal.   pantoprazole (PROTONIX) 40 MG tablet TAKE 1 TABLET 30 MINUTES BEFORE MEALS (BREAKFAST AND SUPPER) TWICE A DAY (Patient taking differently: Take 40 mg by mouth 2 (two) times daily. TAKE 1 TABLET 30 MINUTES BEFORE MEALS (BREAKFAST AND SUPPER) TWICE A DAY)   Past Medical History:   Diagnosis Date   Acrophobia    Alcoholic cirrhosis (HCC) 10/04/2015   Anemia    Arthritis    foot by big toe   BPH associated with nocturia    Cataract    removed both eyes   Chronic cough    PMH of   CLL (chronic lymphocytic leukemia) (HCC)    COVID-19 12/2018   Diabetes mellitus without complication (HCC)    Diverticulosis 07/03/2010   Colonoscopy.    Duodenal ulcer 2017   Fallen arches    Bilateral   GERD (gastroesophageal reflux disease)    Gout    Granuloma annulare    Hx of adenomatous colonic polyps multiple   Hydrocele 2011   Large septated right hydrocele   Liver cyst    Liver lesion    Nonspecific elevation of levels of transaminase or lactic acid dehydrogenase (LDH)    Obesity    Peripheral neuropathy    Plantar fasciitis    PMH of   Portal hypertension (HCC) 2017   Prostate cancer (HCC)    Right shoulder pain 11/2017   Sleep apnea    no cpap, patient denies   Thiamine deficiency    Wears reading eyeglasses    WPW (Wolff-Parkinson-White syndrome) 02/05/2019  Past Surgical History:  Procedure Laterality Date   BACK SURGERY  04/28/2022   CATARACT EXTRACTION, BILATERAL  12/2011   Dr Nile Riggs   COLONOSCOPY  2017   COLONOSCOPY W/ POLYPECTOMY  07/03/2010   2 adenomas, diverticulosis on right. Dr Leone Payor   CYSTOSCOPY N/A 03/15/2018   Procedure: Derinda Late;  Surgeon: Crista Elliot, MD;  Location: [facility];  Service: Urology;  Laterality: N/A;  NO SEEDS FOUND IN BLADDER   ESOPHAGOGASTRODUODENOSCOPY (EGD) WITH PROPOFOL N/A 01/08/2016   Procedure: ESOPHAGOGASTRODUODENOSCOPY (EGD) WITH PROPOFOL;  Surgeon: Meryl Dare, MD;  Location: [facility];  Service: Endoscopy;  Laterality: N/A;   FLEXIBLE SIGMOIDOSCOPY  2000   RADIOACTIVE SEED IMPLANT N/A 03/15/2018   Procedure: RADIOACTIVE SEED IMPLANT/BRACHYTHERAPY IMPLANT;  Surgeon: Crista Elliot, MD;  Location: [facility];  Service: Urology;  Laterality: N/A;    73 SEEDS IMPLANTED   SIGMOIDOSCOPY     SPACE OAR INSTILLATION N/A 03/15/2018   Procedure: SPACE OAR INSTILLATION;  Surgeon: Crista Elliot, MD;  Location: [facility];  Service: Urology;  Laterality: N/A;   WISDOM TOOTH EXTRACTION     Social History   Social History Narrative   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 He lives with his wife in a one level home. He swims most days.   family history includes Leukemia in his father; Lung cancer in his mother.   Review of Systems   Objective:   Physical Exam BP 110/60   Pulse 79   Ht 6\' 2"  (1.88 m)   Wt 221 lb 6.4 oz (100.4 kg)   BMI 28.43 kg/m  Elderly WM NAD Wide based slow gait Anicteric Lungs cta Cor NL S1S2 no rmg Abd obese, soft NT, no HSM/mass Small umbilical hernia soft NT Ext no edema Neuro alert and oriented x 3 no asterixis

## 2022-06-18 NOTE — Patient Instructions (Signed)
We have provided you with diet information to read and follow.   We will see you in the fall for an EGD.   _______________________________________________________  If your blood pressure at your visit was 140/90 or greater, please contact your primary care physician to follow up on this.  _______________________________________________________  If you are age 78 or older, your body mass index should be between 23-30. Your Body mass index is 28.43 kg/m. If this is out of the aforementioned range listed, please consider follow up with your Primary Care Provider.  If you are age 27 or younger, your body mass index should be between 19-25. Your Body mass index is 28.43 kg/m. If this is out of the aformentioned range listed, please consider follow up with your Primary Care Provider.   ________________________________________________________  The  GI providers would like to encourage you to use [facility] to communicate with providers for non-urgent requests or questions.  Due to long hold times on the telephone, sending your provider a message by [facility] may be a faster and more efficient way to get a response.  Please allow 48 business hours for a response.  Please remember that this is for non-urgent requests.  _______________________________________________________    I appreciate the opportunity to care for you. Stan Head, MD, [facility]

## 2022-06-19 ENCOUNTER — Encounter: Payer: Self-pay | Admitting: Internal Medicine

## 2022-07-02 ENCOUNTER — Other Ambulatory Visit: Payer: Self-pay | Admitting: Internal Medicine

## 2022-07-02 DIAGNOSIS — Z794 Long term (current) use of insulin: Secondary | ICD-10-CM

## 2022-07-23 DIAGNOSIS — Z794 Long term (current) use of insulin: Secondary | ICD-10-CM | POA: Diagnosis not present

## 2022-07-23 DIAGNOSIS — Z961 Presence of intraocular lens: Secondary | ICD-10-CM | POA: Diagnosis not present

## 2022-07-23 DIAGNOSIS — E119 Type 2 diabetes mellitus without complications: Secondary | ICD-10-CM | POA: Diagnosis not present

## 2022-08-05 NOTE — Therapy (Signed)
OUTPATIENT PHYSICAL THERAPY THORACOLUMBAR EVALUATION   Patient Name: Patrick Kidd MRN: [id number] DOB:03-Jan-1946, 77 y.o., male Today's Date: 08/07/2022  END OF SESSION:  PT End of Session -  08/06/2022    Visit Number 1   Number of Visits 12   Date for PT Re-Evaluation 10/01/2022   Authorization Type MCR A & B   PT Start Time 1411   PT Stop Time 1502   PT Time Calculation (min) 51   Activity Tolerance Patient tolerated treatment well   Behavior During Therapy  [facility] for tasks assessed/performed     Past Medical History:  Diagnosis Date   Acrophobia    Alcoholic cirrhosis (HCC) 10/04/2015   Anemia    Arthritis    foot by big toe   BPH associated with nocturia    Cataract    removed both eyes   Cholecystitis 05/2022   tx Abx   Chronic cough    PMH of   CLL (chronic lymphocytic leukemia) (HCC)    COVID-19 12/2018   Diabetes mellitus without complication (HCC)    Diverticulosis 07/03/2010   Colonoscopy.    Duodenal ulcer 2017   Fallen arches    Bilateral   GERD (gastroesophageal reflux disease)    Gout    Granuloma annulare    Hx of adenomatous colonic polyps multiple   Hydrocele 2011   Large septated right hydrocele   Liver cyst    Liver lesion    Nonspecific elevation of levels of transaminase or lactic acid dehydrogenase (LDH)    Obesity    Peripheral neuropathy    Plantar fasciitis    PMH of   Portal hypertension (HCC) 2017   Prostate cancer (HCC)    Right shoulder pain 11/2017   Sleep apnea    no cpap, patient denies   Thiamine deficiency    Wears reading eyeglasses    WPW (Wolff-Parkinson-White syndrome) 02/05/2019   Past Surgical History:  Procedure Laterality Date   BACK SURGERY  04/28/2022   CATARACT EXTRACTION, BILATERAL  12/2011   Dr Nile Riggs   COLONOSCOPY  2017   COLONOSCOPY W/ POLYPECTOMY  07/03/2010   2 adenomas, diverticulosis on right. Dr Leone Payor   CYSTOSCOPY N/A 03/15/2018   Procedure: Derinda Late;  Surgeon: Crista Elliot, MD;  Location: [facility] [place];  Service: Urology;  Laterality: N/A;  NO SEEDS FOUND IN BLADDER   ESOPHAGOGASTRODUODENOSCOPY (EGD) WITH PROPOFOL N/A 01/08/2016   Procedure: ESOPHAGOGASTRODUODENOSCOPY (EGD) WITH PROPOFOL;  Surgeon: Meryl Dare, MD;  Location: [facility];  Service: Endoscopy;  Laterality: N/A;   FLEXIBLE SIGMOIDOSCOPY  2000   RADIOACTIVE SEED IMPLANT N/A 03/15/2018   Procedure: RADIOACTIVE SEED IMPLANT/BRACHYTHERAPY IMPLANT;  Surgeon: Crista Elliot, MD;  Location: [facility];  Service: Urology;  Laterality: N/A;   73 SEEDS IMPLANTED   SIGMOIDOSCOPY     SPACE OAR INSTILLATION N/A 03/15/2018   Procedure: SPACE OAR INSTILLATION;  Surgeon: Crista Elliot, MD;  Location: [facility];  Service: Urology;  Laterality: N/A;   WISDOM TOOTH EXTRACTION     Patient Active Problem List   Diagnosis Date Noted   E coli bacteremia 06/01/2022   Sludge in gallbladder 05/30/2022   Hyperbilirubinemia 05/30/2022   Urinary incontinence 06/25/2021   Degenerative lumbar spinal stenosis 04/30/2021   Degenerative arthritis of knee, bilateral 03/27/2021   Chronic lymphocytic leukemia (HCC) 11/08/2020   Insulin-requiring or dependent type II diabetes mellitus (HCC) 09/25/2020   Stage 3a chronic kidney  disease (HCC) 03/15/2020   Thiamine deficiency    Paroxysmal atrial fibrillation (HCC) 07/19/2019   Allergic rhinitis 06/09/2019   Erectile dysfunction due to diabetes mellitus (HCC) 06/09/2019   Overweight with body mass index (BMI) of 28 to 28.9 in adult 03/17/2019   WPW (Wolff-Parkinson-White syndrome) 02/05/2019   SBP (spontaneous bacterial peritonitis) (HCC)-December 2020,     Encephalopathy, hepatic (HCC)    Degenerative disc disease, cervical 07/21/2018   Cervical radiculopathy 07/16/2018   OAB (overactive bladder) 07/06/2018   Malignant neoplasm of prostate (HCC) 01/13/2018   Hyperlipidemia LDL goal <100 11/11/2017    Essential hypertension 11/11/2017   BPH associated with nocturia 11/11/2017   Erectile dysfunction due to arterial insufficiency 04/07/2017   Peripheral vascular disease (HCC) 06/25/2016   Alcoholic cirrhosis (HCC) 10/04/2015   Type 2 diabetes mellitus with complication, with long-term current use of insulin (HCC) 08/22/2015   Hypersomnolence 03/19/2014   Peripheral neuropathy 01/11/2013   Polyclonal gammopathy 01/11/2013   GERD 10/04/2009     REFERRING PROVIDER: Julio Sicks, MD   REFERRING DIAG: M54.16 (ICD-10-CM) - Radiculopathy, lumbar region  Rationale for Evaluation and Treatment: Rehabilitation  THERAPY DIAG:  Muscle weakness (generalized)  Difficulty in walking, not elsewhere classified  Radiculopathy, lumbar region  ONSET DATE: DOS 04/28/2022  SUBJECTIVE:                                                                                                                                                                                           SUBJECTIVE STATEMENT: Pt reports having some deficits with ambulation and balance after being hospitalized in 2020 due to Covid.    Pt began having R sided lumbar pain which traveled down R LE to knee.  He also had N/T.  Pt received injections in 2023 which only provided relief for 3 days.  He also had PT in 2023 which helped a little.  Pt had lumbar surgery on 04/28/2022.  MD note indicates pt underwent lumbar decompressive surgery at 3 levels.  Pt reports significant improvement in sx's since having surgery including his pain and radicular sx's being eliminated and his back feeling fine.  Pt states he had bone spurs at L4 and L5 pressing on the sciatic nerve which MD removed.  Pt saw MD on 06/05/2022 and MD ordered PT.   Pt states he has been released to gym exercises with no weight/lifting restrictions.  Pt states he had lifting restrictions for the 1st 60 days.  He has difficulty and pain with performing stairs.  He reports having weakness  in LE's.  Pt has difficulty with ambulation.  He is walking 3/10 of a mile.  Pt reports having deficits with balance.  Pt uses a cane on uneven ground, though doesn't use a cane on level ground.  Pt has stiffness sitting in the car.   Pt walks in the pool 2-3x/wk.  Pt was in the hospital in May for cholecystitis.  Pt does still have some aggravating pain at gallbladder, not sharp pain.  PERTINENT HISTORY:  Lumbar decompression surgery on 04/27/2022 Lumbar MRI in 2023 showed mild retrolisthesis L1-2, mild anterolisthesis L3-4. And mild retrolisthesis L4-5. DM type 2 and Peripheral neuropathy CLL (chronic lymphocytic leukemia) Arthritis (X ray findings of OA in R hip and L knee) Prostate CA with radiation Alcoholic Cirrhosis WPW (Wolff-Parkinson-White syndrome), Pt states it doesn't affect him  PAIN:  Are you having pain? Yes NPRS:  3/10 current Location: bilat knees > ankles.  Pt denies pain in lumbar and R thigh.    PRECAUTIONS: Other: per surgery ; mild retrolisthesis and anterolisthesis ; CLL and peripheral neuropathy ; Pt reports having balance deficits  WEIGHT BEARING RESTRICTIONS: none indicated on MD note and pt reports none also  FALLS:  Has patient fallen in last 6 months? No  LIVING ENVIRONMENT: Lives with: lives with their spouse Lives in: 1 story home Stairs: 1 step with 1 rail Has following equipment at home: cane  OCCUPATION: part time work.  Driving.  Pt does have to ambulate on uneven terrain.    PLOF: Independent.  Pt was able to ambulate 1.5 miles prior to increased lumbar and LE pain.  PATIENT GOALS: to be able to perform stairs   OBJECTIVE:   DIAGNOSTIC FINDINGS:  Lumbar MRI in 2023: -Alignment: Mild retrolisthesis L1-2. Mild anterolisthesis L3-4. Mild retrolisthesis L4-5. IMPRESSION: -Motion degraded study  -Negative for fracture or metastatic disease  -Lumbar spondylosis. Multilevel spinal and foraminal stenosis  X ray of R hip in 2023:   IMPRESSION: Mild osteoarthritis of the right hip.  X rays of L knee in 2023: IMPRESSION: Mild-to-moderate medial and patellofemoral osteoarthritis.  Chronic spurring at the patellar tendon insertion on the tibial tubercle.  PATIENT SURVEYS:  FOTO 44 with a goal of 52 at visit 13   COGNITION: Overall cognitive status: Within functional limits for tasks assessed     LUMBAR ROM:   Not tested   LOWER EXTREMITY MMT:    MMT Right eval Left eval  Hip flexion 4+/5 5/5  Hip extension    Hip abduction 33.0 36.3  Hip adduction    Hip internal rotation    Hip external rotation    Knee flexion 4/5 seated 5/5 / seated with knee pain  Knee extension 5/5 4+/5  Ankle dorsiflexion 5/5 5/5  Ankle plantarflexion WFL seated WFL seated  Ankle inversion    Ankle eversion     (Blank rows = not tested)   FUNCTIONAL TESTS:  5x STS test:  28.9 without UE support  GAIT: Assistive device utilized: None Level of assistance: Independent Comments: Decreased gait speed, decreased step length bilat, decreased foot clearance bilat.   TODAY'S TREATMENT:  See below for pt education  PATIENT EDUCATION:  Education details: PT answered pt's questions.  PT educated pt concerning dx, objective findings, POC, rationale of interventions, and relevant anatomy.  Person educated: Patient Education method: Explanation Education comprehension: verbalized understanding  HOME EXERCISE PROGRAM: Will give next visit  ASSESSMENT:  CLINICAL IMPRESSION: Patient is a 77 y.o. male with a dx of lumbar radiculopathy 14 weeks and 2 days s/p lumbar decompression surgery   Pt reports significant improvement in sx's since having surgery including his pain and radicular sx's being eliminated and his back feeling fine.  He reports having difficulty with ambulation and balance since 2020.   He has difficulty and pain with performing stairs.  He reports having weakness in LE's and is limited with ambulation.  Pt uses a cane on uneven ground, though doesn't use a cane on level ground.  He has stiffness sitting in the car.  Pt demonstrates weakness in R > L LE.  He should benefit from skilled PT services to address impairments and improve overall function.     OBJECTIVE IMPAIRMENTS: Abnormal gait, decreased activity tolerance, decreased endurance, decreased mobility, difficulty walking, decreased strength, and pain.   ACTIVITY LIMITATIONS: sitting, stairs, and locomotion level  PARTICIPATION LIMITATIONS: shopping and community activity  PERSONAL FACTORS: 3+ comorbidities: CLL, DM Type 2, arthritis, and peripheral neuropathy. Pt reports having balance deficits  are also affecting patient's functional outcome.   REHAB POTENTIAL: Good  CLINICAL DECISION MAKING: Stable/uncomplicated  EVALUATION COMPLEXITY: Low   GOALS:  SHORT TERM GOALS: Target date: 08/27/2022   Pt will be independent and compliant with HEP for improved pain, strength, ROM, and function.   Baseline: Goal status: INITIAL  2.  Pt will report at least a 25% improvement in his mobility overall.  Baseline:  Goal status: INITIAL  3.  Pt will be able to perform a 6 inch step up with good form and good control. Baseline:  Goal status: INITIAL Target date:  09/03/2022  4.  Pt will report improved balance with his daily mobility and daily activities.  Baseline:  Goal status: INITIAL Target date:  09/03/2022    LONG TERM GOALS: Target date: 10/01/2022  Pt will demo improved 5x STS test to no > 16 sec for improved functional LE strength and improved performance of transfers.  Baseline:  Goal status: INITIAL  2.  Pt will demo improved R hip flex and knee flexion (seated) strength to 5/5 MMT and bilat hip abd strength by at least 5#'s (HHD) for improved tolerance with and performance of functional mobility.    Baseline:  Goal status: INITIAL  3.  Pt will report he is able to perform extended community ambulation distance without significant difficulty and without significant pain.   Baseline:  Goal status: INITIAL  4.  Pt will be able to perform stairs with a reciprocal gait with rail without significant difficulty and with good control.  Baseline:  Goal status: INITIAL    PLAN:  PT FREQUENCY: 2x/week  PT DURATION: 6-8 weeks though pt may be out of town at times during the cert period  PLANNED INTERVENTIONS: Therapeutic exercises, Therapeutic activity, Neuromuscular re-education, Balance training, Gait training, Patient/Family education, Self Care, Joint mobilization, Stair training, DME instructions, Aquatic Therapy, Cryotherapy, scar mobilization, Taping, Manual therapy, and Re-evaluation.  PLAN FOR NEXT SESSION: Establish HEP.  Supine TrA contractions, supine marching with TrA, supine SLR, S/L hip abd, mini squats, step ups on 4 inch step, and S/L clams.  Balance exercises including standing  with FT and airex exercises.  Assess HS flexibility and possible HS stretching.    Audie Clear III PT, DPT 08/08/22 8:08 AM

## 2022-08-06 ENCOUNTER — Other Ambulatory Visit: Payer: Self-pay

## 2022-08-06 ENCOUNTER — Ambulatory Visit (HOSPITAL_BASED_OUTPATIENT_CLINIC_OR_DEPARTMENT_OTHER): Payer: Medicare Other | Attending: Neurosurgery | Admitting: Physical Therapy

## 2022-08-06 DIAGNOSIS — M6281 Muscle weakness (generalized): Secondary | ICD-10-CM | POA: Insufficient documentation

## 2022-08-06 DIAGNOSIS — R262 Difficulty in walking, not elsewhere classified: Secondary | ICD-10-CM | POA: Diagnosis not present

## 2022-08-06 DIAGNOSIS — M5416 Radiculopathy, lumbar region: Secondary | ICD-10-CM | POA: Diagnosis not present

## 2022-08-07 ENCOUNTER — Encounter (HOSPITAL_BASED_OUTPATIENT_CLINIC_OR_DEPARTMENT_OTHER): Payer: Self-pay | Admitting: Physical Therapy

## 2022-08-11 ENCOUNTER — Encounter (HOSPITAL_BASED_OUTPATIENT_CLINIC_OR_DEPARTMENT_OTHER): Payer: Self-pay

## 2022-08-11 ENCOUNTER — Ambulatory Visit (HOSPITAL_BASED_OUTPATIENT_CLINIC_OR_DEPARTMENT_OTHER): Payer: Medicare Other

## 2022-08-11 DIAGNOSIS — M5416 Radiculopathy, lumbar region: Secondary | ICD-10-CM

## 2022-08-11 DIAGNOSIS — M6281 Muscle weakness (generalized): Secondary | ICD-10-CM | POA: Diagnosis not present

## 2022-08-11 DIAGNOSIS — R262 Difficulty in walking, not elsewhere classified: Secondary | ICD-10-CM | POA: Diagnosis not present

## 2022-08-11 NOTE — Therapy (Signed)
OUTPATIENT PHYSICAL THERAPY THORACOLUMBAR TREATMENT   Patient Name: Patrick Kidd MRN: [id number] DOB:Oct 26, 1945, 77 y.o., male Today's Date: 08/11/2022  END OF SESSION:  PT End of Session - 08/11/22 1346     Visit Number 2    Number of Visits 12    Date for PT Re-Evaluation 10/01/22    Authorization Type MCR A & B    PT Start Time 1346    PT Stop Time 1430    PT Time Calculation (min) 44 min    Activity Tolerance Patient tolerated treatment well    Behavior During Therapy [facility] for tasks assessed/performed            PT End of Session -  08/06/2022    Visit Number 1   Number of Visits 12   Date for PT Re-Evaluation 10/01/2022   Authorization Type MCR A & B   PT Start Time 1411   PT Stop Time 1502   PT Time Calculation (min) 51   Activity Tolerance Patient tolerated treatment well   Behavior During Therapy  [facility] for tasks assessed/performed     Past Medical History:  Diagnosis Date   Acrophobia    Alcoholic cirrhosis (HCC) 10/04/2015   Anemia    Arthritis    foot by big toe   BPH associated with nocturia    Cataract    removed both eyes   Cholecystitis 05/2022   tx Abx   Chronic cough    PMH of   CLL (chronic lymphocytic leukemia) (HCC)    COVID-19 12/2018   Diabetes mellitus without complication (HCC)    Diverticulosis 07/03/2010   Colonoscopy.    Duodenal ulcer 2017   Fallen arches    Bilateral   GERD (gastroesophageal reflux disease)    Gout    Granuloma annulare    Hx of adenomatous colonic polyps multiple   Hydrocele 2011   Large septated right hydrocele   Liver cyst    Liver lesion    Nonspecific elevation of levels of transaminase or lactic acid dehydrogenase (LDH)    Obesity    Peripheral neuropathy    Plantar fasciitis    PMH of   Portal hypertension (HCC) 2017   Prostate cancer (HCC)    Right shoulder pain 11/2017   Sleep apnea    no cpap, patient denies   Thiamine deficiency    Wears reading eyeglasses    WPW  (Wolff-Parkinson-White syndrome) 02/05/2019   Past Surgical History:  Procedure Laterality Date   BACK SURGERY  04/28/2022   CATARACT EXTRACTION, BILATERAL  12/2011   Dr Nile Riggs   COLONOSCOPY  2017   COLONOSCOPY W/ POLYPECTOMY  07/03/2010   2 adenomas, diverticulosis on right. Dr Leone Payor   CYSTOSCOPY N/A 03/15/2018   Procedure: Derinda Late;  Surgeon: Crista Elliot, MD;  Location: [facility];  Service: Urology;  Laterality: N/A;  NO SEEDS FOUND IN BLADDER   ESOPHAGOGASTRODUODENOSCOPY (EGD) WITH PROPOFOL N/A 01/08/2016   Procedure: ESOPHAGOGASTRODUODENOSCOPY (EGD) WITH PROPOFOL;  Surgeon: Meryl Dare, MD;  Location: [facility];  Service: Endoscopy;  Laterality: N/A;   FLEXIBLE SIGMOIDOSCOPY  2000   RADIOACTIVE SEED IMPLANT N/A 03/15/2018   Procedure: RADIOACTIVE SEED IMPLANT/BRACHYTHERAPY IMPLANT;  Surgeon: Crista Elliot, MD;  Location: [facility];  Service: Urology;  Laterality: N/A;   73 SEEDS IMPLANTED   SIGMOIDOSCOPY     SPACE OAR INSTILLATION N/A 03/15/2018   Procedure: SPACE OAR INSTILLATION;  Surgeon: Ray Church III,  MD;  Location: [facility];  Service: Urology;  Laterality: N/A;   WISDOM TOOTH EXTRACTION     Patient Active Problem List   Diagnosis Date Noted   E coli bacteremia 06/01/2022   Sludge in gallbladder 05/30/2022   Hyperbilirubinemia 05/30/2022   Urinary incontinence 06/25/2021   Degenerative lumbar spinal stenosis 04/30/2021   Degenerative arthritis of knee, bilateral 03/27/2021   Chronic lymphocytic leukemia (HCC) 11/08/2020   Insulin-requiring or dependent type II diabetes mellitus (HCC) 09/25/2020   Stage 3a chronic kidney disease (HCC) 03/15/2020   Thiamine deficiency    Paroxysmal atrial fibrillation (HCC) 07/19/2019   Allergic rhinitis 06/09/2019   Erectile dysfunction due to diabetes mellitus (HCC) 06/09/2019   Overweight with body mass index (BMI) of 28 to 28.9 in adult 03/17/2019    WPW (Wolff-Parkinson-White syndrome) 02/05/2019   SBP (spontaneous bacterial peritonitis) (HCC)-December 2020,     Encephalopathy, hepatic (HCC)    Degenerative disc disease, cervical 07/21/2018   Cervical radiculopathy 07/16/2018   OAB (overactive bladder) 07/06/2018   Malignant neoplasm of prostate (HCC) 01/13/2018   Hyperlipidemia LDL goal <100 11/11/2017   Essential hypertension 11/11/2017   BPH associated with nocturia 11/11/2017   Erectile dysfunction due to arterial insufficiency 04/07/2017   Peripheral vascular disease (HCC) 06/25/2016   Alcoholic cirrhosis (HCC) 10/04/2015   Type 2 diabetes mellitus with complication, with long-term current use of insulin (HCC) 08/22/2015   Hypersomnolence 03/19/2014   Peripheral neuropathy 01/11/2013   Polyclonal gammopathy 01/11/2013   GERD 10/04/2009     REFERRING PROVIDER: Julio Sicks, MD   REFERRING DIAG: M54.16 (ICD-10-CM) - Radiculopathy, lumbar region  Rationale for Evaluation and Treatment: Rehabilitation  THERAPY DIAG:  Muscle weakness (generalized)  Difficulty in walking, not elsewhere classified  Radiculopathy, lumbar region  ONSET DATE: DOS 04/28/2022  SUBJECTIVE:                                                                                                                                                                                           SUBJECTIVE STATEMENT:  Pt reports on Wednesday he had an episode of severe fatigue. "Drinking orange juice helped." Reports no similar instances since. No pain at netry. Only pain with sitting for >35mins and laying on back for >2 hours. "I want to know what machines I can do at the [facility]."  Eval: Pt reports having some deficits with ambulation and balance after being hospitalized in 2020 due to Covid.    Pt began having R sided lumbar pain which traveled down R LE to knee.  He also had N/T.  Pt received injections in 2023 which only provided relief  for 3 days.  He also had  PT in 2023 which helped a little.  Pt had lumbar surgery on 04/28/2022.  MD note indicates pt underwent lumbar decompressive surgery at 3 levels.  Pt reports significant improvement in sx's since having surgery including his pain and radicular sx's being eliminated and his back feeling fine.  Pt states he had bone spurs at L4 and L5 pressing on the sciatic nerve which MD removed.  Pt saw MD on 06/05/2022 and MD ordered PT.   Pt states he has been released to gym exercises with no weight/lifting restrictions.  Pt states he had lifting restrictions for the 1st 60 days.  He has difficulty and pain with performing stairs.  He reports having weakness in LE's.  Pt has difficulty with ambulation.  He is walking 3/10 of a mile.  Pt reports having deficits with balance.  Pt uses a cane on uneven ground, though doesn't use a cane on level ground.  Pt has stiffness sitting in the car.   Pt walks in the pool 2-3x/wk.  Pt was in the hospital in May for cholecystitis.  Pt does still have some aggravating pain at gallbladder, not sharp pain.  PERTINENT HISTORY:  Lumbar decompression surgery on 04/27/2022 Lumbar MRI in 2023 showed mild retrolisthesis L1-2, mild anterolisthesis L3-4. And mild retrolisthesis L4-5. DM type 2 and Peripheral neuropathy CLL (chronic lymphocytic leukemia) Arthritis (X ray findings of OA in R hip and L knee) Prostate CA with radiation Alcoholic Cirrhosis WPW (Wolff-Parkinson-White syndrome), Pt states it doesn't affect him  PAIN:  Are you having pain? Yes NPRS:  0/10 current Location: bilat knees > ankles.  Pt denies pain in lumbar and R thigh.    PRECAUTIONS: Other: per surgery ; mild retrolisthesis and anterolisthesis ; CLL and peripheral neuropathy ; Pt reports having balance deficits  WEIGHT BEARING RESTRICTIONS: none indicated on MD note and pt reports none also  FALLS:  Has patient fallen in last 6 months? No  LIVING ENVIRONMENT: Lives with: lives with their spouse Lives  in: 1 story home Stairs: 1 step with 1 rail Has following equipment at home: cane  OCCUPATION: part time work.  Driving.  Pt does have to ambulate on uneven terrain.    PLOF: Independent.  Pt was able to ambulate 1.5 miles prior to increased lumbar and LE pain.  PATIENT GOALS: to be able to perform stairs   OBJECTIVE:   DIAGNOSTIC FINDINGS:  Lumbar MRI in 2023: -Alignment: Mild retrolisthesis L1-2. Mild anterolisthesis L3-4. Mild retrolisthesis L4-5. IMPRESSION: -Motion degraded study  -Negative for fracture or metastatic disease  -Lumbar spondylosis. Multilevel spinal and foraminal stenosis  X ray of R hip in 2023:  IMPRESSION: Mild osteoarthritis of the right hip.  X rays of L knee in 2023: IMPRESSION: Mild-to-moderate medial and patellofemoral osteoarthritis.  Chronic spurring at the patellar tendon insertion on the tibial tubercle.  PATIENT SURVEYS:  FOTO 44 with a goal of 52 at visit 13   COGNITION: Overall cognitive status: Within functional limits for tasks assessed     LUMBAR ROM:   Not tested   LOWER EXTREMITY MMT:    MMT Right eval Left eval  Hip flexion 4+/5 5/5  Hip extension    Hip abduction 33.0 36.3  Hip adduction    Hip internal rotation    Hip external rotation    Knee flexion 4/5 seated 5/5 / seated with knee pain  Knee extension 5/5 4+/5  Ankle dorsiflexion 5/5 5/5  Ankle plantarflexion  WFL seated WFL seated  Ankle inversion    Ankle eversion     (Blank rows = not tested)   FUNCTIONAL TESTS:  5x STS test:  28.9 without UE support  GAIT: Assistive device utilized: None Level of assistance: Independent Comments: Decreased gait speed, decreased step length bilat, decreased foot clearance bilat.   TODAY'S TREATMENT:                                                                                                                                 Hamstring stretch with strap 2x30sec bilaterally.   Piriformis stretch 30sec x3    Supine SLR 2x10R  Sidelying abduction 2x10ea  Sit to stands (R LE behind) 1x10  Leg extension machine 25# 2x10 (cues for >R leg)  HSC machine 35# 2x10 (cues for >RLE)  Leg press (white) 130# 2x10 (cues for >RLE)  Step ups 4" 2x10 R (requires use of rail or SBA for balance)  PATIENT EDUCATION:  Education details: PT answered pt's questions.  PT educated pt concerning dx, objective findings, POC, rationale of interventions, and relevant anatomy.  Person educated: Patient Education method: Explanation Education comprehension: verbalized understanding  HOME EXERCISE PROGRAM: Access Code: [email or web address] URL: [email or web address] Date: 08/11/2022 Prepared by: Riki Altes  Exercises - Hooklying Hamstring Stretch with Strap  - 1 x daily - 7 x weekly - 3 sets - 30 seconds hold - Supine Active Straight Leg Raise  - 1 x daily - 7 x weekly - 3 sets - 10 reps - Sidelying Hip Abduction  - 1 x daily - 7 x weekly - 3 sets - 10 reps - Sit to Stand Without Arm Support  - 1 x daily - 7 x weekly - 3 sets - 10 reps  ASSESSMENT:  CLINICAL IMPRESSION: Pt requires cuing to maintain knee extension with SLR and HS stretching.  Weakness observed in hip abductors with sidelying SLR. Trialed step ups at 4" step with pt demonstrating difficulty balancing without use of rail. He required SBA as a precaution. No pain with this, but he did report general fatigue. Also required cues for full hip/trunk extension with this. Instructed pt in gym equipment appropriate for him to use. Required cues for proper use of machines and intensity level. Provided HEP today for pt to begin working on at home. He will require review of this for technique/positioning next visit. Will monitor fatigue level.   OBJECTIVE IMPAIRMENTS: Abnormal gait, decreased activity tolerance, decreased endurance, decreased mobility, difficulty walking, decreased strength, and pain.   ACTIVITY LIMITATIONS: sitting, stairs, and locomotion  level  PARTICIPATION LIMITATIONS: shopping and community activity  PERSONAL FACTORS: 3+ comorbidities: CLL, DM Type 2, arthritis, and peripheral neuropathy. Pt reports having balance deficits  are also affecting patient's functional outcome.   REHAB POTENTIAL: Good  CLINICAL DECISION MAKING: Stable/uncomplicated  EVALUATION COMPLEXITY: Low   GOALS:  SHORT TERM GOALS: Target date: 08/27/2022   Pt will be independent and compliant  with HEP for improved pain, strength, ROM, and function.   Baseline: Goal status: INITIAL  2.  Pt will report at least a 25% improvement in his mobility overall.  Baseline:  Goal status: INITIAL  3.  Pt will be able to perform a 6 inch step up with good form and good control. Baseline:  Goal status: INITIAL Target date:  09/03/2022  4.  Pt will report improved balance with his daily mobility and daily activities.  Baseline:  Goal status: INITIAL Target date:  09/03/2022    LONG TERM GOALS: Target date: 10/01/2022  Pt will demo improved 5x STS test to no > 16 sec for improved functional LE strength and improved performance of transfers.  Baseline:  Goal status: INITIAL  2.  Pt will demo improved R hip flex and knee flexion (seated) strength to 5/5 MMT and bilat hip abd strength by at least 5#'s (HHD) for improved tolerance with and performance of functional mobility.   Baseline:  Goal status: INITIAL  3.  Pt will report he is able to perform extended community ambulation distance without significant difficulty and without significant pain.   Baseline:  Goal status: INITIAL  4.  Pt will be able to perform stairs with a reciprocal gait with rail without significant difficulty and with good control.  Baseline:  Goal status: INITIAL    PLAN:  PT FREQUENCY: 2x/week  PT DURATION: 6-8 weeks though pt may be out of town at times during the cert period  PLANNED INTERVENTIONS: Therapeutic exercises, Therapeutic activity, Neuromuscular  re-education, Balance training, Gait training, Patient/Family education, Self Care, Joint mobilization, Stair training, DME instructions, Aquatic Therapy, Cryotherapy, scar mobilization, Taping, Manual therapy, and Re-evaluation.  PLAN FOR NEXT SESSION: Establish HEP.  Supine TrA contractions, supine marching with TrA, supine SLR, S/L hip abd, mini squats, step ups on 4 inch step, and S/L clams.  Balance exercises including standing with FT and airex exercises.  Assess HS flexibility and possible HS stretching.    Riki Altes, PTA  08/11/22 3:58 PM

## 2022-08-15 ENCOUNTER — Ambulatory Visit (HOSPITAL_BASED_OUTPATIENT_CLINIC_OR_DEPARTMENT_OTHER): Payer: Medicare Other | Admitting: Physical Therapy

## 2022-08-19 ENCOUNTER — Ambulatory Visit (HOSPITAL_BASED_OUTPATIENT_CLINIC_OR_DEPARTMENT_OTHER): Payer: Medicare Other

## 2022-08-19 ENCOUNTER — Encounter (HOSPITAL_BASED_OUTPATIENT_CLINIC_OR_DEPARTMENT_OTHER): Payer: Self-pay

## 2022-08-19 DIAGNOSIS — M6281 Muscle weakness (generalized): Secondary | ICD-10-CM | POA: Diagnosis not present

## 2022-08-19 DIAGNOSIS — R262 Difficulty in walking, not elsewhere classified: Secondary | ICD-10-CM

## 2022-08-19 DIAGNOSIS — M5416 Radiculopathy, lumbar region: Secondary | ICD-10-CM | POA: Diagnosis not present

## 2022-08-19 NOTE — Therapy (Addendum)
OUTPATIENT PHYSICAL THERAPY THORACOLUMBAR TREATMENT   Patient Name: Patrick Kidd MRN: [id number] DOB:1946-01-14, 77 y.o., male Today's Date: 08/19/2022  END OF SESSION:  PT End of Session - 08/19/22 1209     Visit Number 3    Number of Visits 12    Date for PT Re-Evaluation 10/01/22    Authorization Type MCR A & B    PT Start Time 1145    PT Stop Time 1228    PT Time Calculation (min) 43 min    Activity Tolerance Patient tolerated treatment well    Behavior During Therapy [facility] for tasks assessed/performed             PT End of Session -  08/06/2022    Visit Number 1   Number of Visits 12   Date for PT Re-Evaluation 10/01/2022   Authorization Type MCR A & B   PT Start Time 1411   PT Stop Time 1502   PT Time Calculation (min) 51   Activity Tolerance Patient tolerated treatment well   Behavior During Therapy  [facility] for tasks assessed/performed     Past Medical History:  Diagnosis Date   Acrophobia    Alcoholic cirrhosis (HCC) 10/04/2015   Anemia    Arthritis    foot by big toe   BPH associated with nocturia    Cataract    removed both eyes   Cholecystitis 05/2022   tx Abx   Chronic cough    PMH of   CLL (chronic lymphocytic leukemia) (HCC)    COVID-19 12/2018   Diabetes mellitus without complication (HCC)    Diverticulosis 07/03/2010   Colonoscopy.    Duodenal ulcer 2017   Fallen arches    Bilateral   GERD (gastroesophageal reflux disease)    Gout    Granuloma annulare    Hx of adenomatous colonic polyps multiple   Hydrocele 2011   Large septated right hydrocele   Liver cyst    Liver lesion    Nonspecific elevation of levels of transaminase or lactic acid dehydrogenase (LDH)    Obesity    Peripheral neuropathy    Plantar fasciitis    PMH of   Portal hypertension (HCC) 2017   Prostate cancer (HCC)    Right shoulder pain 11/2017   Sleep apnea    no cpap, patient denies   Thiamine deficiency    Wears reading eyeglasses    WPW  (Wolff-Parkinson-White syndrome) 02/05/2019   Past Surgical History:  Procedure Laterality Date   BACK SURGERY  04/28/2022   CATARACT EXTRACTION, BILATERAL  12/2011   Dr Nile Riggs   COLONOSCOPY  2017   COLONOSCOPY W/ POLYPECTOMY  07/03/2010   2 adenomas, diverticulosis on right. Dr Leone Payor   CYSTOSCOPY N/A 03/15/2018   Procedure: Derinda Late;  Surgeon: Crista Elliot, MD;  Location: [facility];  Service: Urology;  Laterality: N/A;  NO SEEDS FOUND IN BLADDER   ESOPHAGOGASTRODUODENOSCOPY (EGD) WITH PROPOFOL N/A 01/08/2016   Procedure: ESOPHAGOGASTRODUODENOSCOPY (EGD) WITH PROPOFOL;  Surgeon: Meryl Dare, MD;  Location: [facility];  Service: Endoscopy;  Laterality: N/A;   FLEXIBLE SIGMOIDOSCOPY  2000   RADIOACTIVE SEED IMPLANT N/A 03/15/2018   Procedure: RADIOACTIVE SEED IMPLANT/BRACHYTHERAPY IMPLANT;  Surgeon: Crista Elliot, MD;  Location: [facility];  Service: Urology;  Laterality: N/A;   73 SEEDS IMPLANTED   SIGMOIDOSCOPY     SPACE OAR INSTILLATION N/A 03/15/2018   Procedure: SPACE OAR INSTILLATION;  Surgeon: Ray Church  III, MD;  Location: [facility];  Service: Urology;  Laterality: N/A;   WISDOM TOOTH EXTRACTION     Patient Active Problem List   Diagnosis Date Noted   E coli bacteremia 06/01/2022   Sludge in gallbladder 05/30/2022   Hyperbilirubinemia 05/30/2022   Urinary incontinence 06/25/2021   Degenerative lumbar spinal stenosis 04/30/2021   Degenerative arthritis of knee, bilateral 03/27/2021   Chronic lymphocytic leukemia (HCC) 11/08/2020   Insulin-requiring or dependent type II diabetes mellitus (HCC) 09/25/2020   Stage 3a chronic kidney disease (HCC) 03/15/2020   Thiamine deficiency    Paroxysmal atrial fibrillation (HCC) 07/19/2019   Allergic rhinitis 06/09/2019   Erectile dysfunction due to diabetes mellitus (HCC) 06/09/2019   Overweight with body mass index (BMI) of 28 to 28.9 in adult 03/17/2019    WPW (Wolff-Parkinson-White syndrome) 02/05/2019   SBP (spontaneous bacterial peritonitis) (HCC)-December 2020,     Encephalopathy, hepatic (HCC)    Degenerative disc disease, cervical 07/21/2018   Cervical radiculopathy 07/16/2018   OAB (overactive bladder) 07/06/2018   Malignant neoplasm of prostate (HCC) 01/13/2018   Hyperlipidemia LDL goal <100 11/11/2017   Essential hypertension 11/11/2017   BPH associated with nocturia 11/11/2017   Erectile dysfunction due to arterial insufficiency 04/07/2017   Peripheral vascular disease (HCC) 06/25/2016   Alcoholic cirrhosis (HCC) 10/04/2015   Type 2 diabetes mellitus with complication, with long-term current use of insulin (HCC) 08/22/2015   Hypersomnolence 03/19/2014   Peripheral neuropathy 01/11/2013   Polyclonal gammopathy 01/11/2013   GERD 10/04/2009     REFERRING PROVIDER: Julio Sicks, MD   REFERRING DIAG: M54.16 (ICD-10-CM) - Radiculopathy, lumbar region  Rationale for Evaluation and Treatment: Rehabilitation  THERAPY DIAG:  Muscle weakness (generalized)  Difficulty in walking, not elsewhere classified  Radiculopathy, lumbar region  ONSET DATE: DOS 04/28/2022  SUBJECTIVE:                                                                                                                                                                                           SUBJECTIVE STATEMENT:  Pt reports no pain at entry. Has not had anymore fatigue since last week. He missed last appt due to a Zoom meeting going longer than scheduled. "I noticed a big improvement in my ability to climb upstairs."  Eval: Pt reports having some deficits with ambulation and balance after being hospitalized in 2020 due to Covid.    Pt began having R sided lumbar pain which traveled down R LE to knee.  He also had N/T.  Pt received injections in 2023 which only provided relief for 3 days.  He also had PT in 2023  which helped a little.  Pt had lumbar surgery on  04/28/2022.  MD note indicates pt underwent lumbar decompressive surgery at 3 levels.  Pt reports significant improvement in sx's since having surgery including his pain and radicular sx's being eliminated and his back feeling fine.  Pt states he had bone spurs at L4 and L5 pressing on the sciatic nerve which MD removed.  Pt saw MD on 06/05/2022 and MD ordered PT.   Pt states he has been released to gym exercises with no weight/lifting restrictions.  Pt states he had lifting restrictions for the 1st 60 days.  He has difficulty and pain with performing stairs.  He reports having weakness in LE's.  Pt has difficulty with ambulation.  He is walking 3/10 of a mile.  Pt reports having deficits with balance.  Pt uses a cane on uneven ground, though doesn't use a cane on level ground.  Pt has stiffness sitting in the car.   Pt walks in the pool 2-3x/wk.  Pt was in the hospital in May for cholecystitis.  Pt does still have some aggravating pain at gallbladder, not sharp pain.  PERTINENT HISTORY:  Lumbar decompression surgery on 04/27/2022 Lumbar MRI in 2023 showed mild retrolisthesis L1-2, mild anterolisthesis L3-4. And mild retrolisthesis L4-5. DM type 2 and Peripheral neuropathy CLL (chronic lymphocytic leukemia) Arthritis (X ray findings of OA in R hip and L knee) Prostate CA with radiation Alcoholic Cirrhosis WPW (Wolff-Parkinson-White syndrome), Pt states it doesn't affect him  PAIN:  Are you having pain? Yes NPRS:  0/10 current Location: bilat knees > ankles.  Pt denies pain in lumbar and R thigh.    PRECAUTIONS: Other: per surgery ; mild retrolisthesis and anterolisthesis ; CLL and peripheral neuropathy ; Pt reports having balance deficits  WEIGHT BEARING RESTRICTIONS: none indicated on MD note and pt reports none also  FALLS:  Has patient fallen in last 6 months? No  LIVING ENVIRONMENT: Lives with: lives with their spouse Lives in: 1 story home Stairs: 1 step with 1 rail Has following  equipment at home: cane  OCCUPATION: part time work.  Driving.  Pt does have to ambulate on uneven terrain.    PLOF: Independent.  Pt was able to ambulate 1.5 miles prior to increased lumbar and LE pain.  PATIENT GOALS: to be able to perform stairs   OBJECTIVE:   DIAGNOSTIC FINDINGS:  Lumbar MRI in 2023: -Alignment: Mild retrolisthesis L1-2. Mild anterolisthesis L3-4. Mild retrolisthesis L4-5. IMPRESSION: -Motion degraded study  -Negative for fracture or metastatic disease  -Lumbar spondylosis. Multilevel spinal and foraminal stenosis  X ray of R hip in 2023:  IMPRESSION: Mild osteoarthritis of the right hip.  X rays of L knee in 2023: IMPRESSION: Mild-to-moderate medial and patellofemoral osteoarthritis.  Chronic spurring at the patellar tendon insertion on the tibial tubercle.  PATIENT SURVEYS:  FOTO 44 with a goal of 52 at visit 13   COGNITION: Overall cognitive status: Within functional limits for tasks assessed     LUMBAR ROM:   Not tested   LOWER EXTREMITY MMT:    MMT Right eval Left eval  Hip flexion 4+/5 5/5  Hip extension    Hip abduction 33.0 36.3  Hip adduction    Hip internal rotation    Hip external rotation    Knee flexion 4/5 seated 5/5 / seated with knee pain  Knee extension 5/5 4+/5  Ankle dorsiflexion 5/5 5/5  Ankle plantarflexion WFL seated WFL seated  Ankle inversion  Ankle eversion     (Blank rows = not tested)   FUNCTIONAL TESTS:  5x STS test:  28.9 without UE support  GAIT: Assistive device utilized: None Level of assistance: Independent Comments: Decreased gait speed, decreased step length bilat, decreased foot clearance bilat.   TODAY'S TREATMENT:                                                                                                                                 Hamstring stretch with strap 3x30sec R  Piriformis stretch 30sec x3R   Supine SLR 2x10R  Sidelying abduction 2x10ea  Sit to stands (R LE  behind) 2x10  Leg extension machine 30# 2x10 (cues for >R leg)  HSC machine 40# 3x10 (cues for >RLE)  Leg press (white) 130# 2x10 (cues for >RLE)  Step ups 6" 2x10 R (requires use of rail for balance)  Eccentric step downs 2" bo 2x10R  PATIENT EDUCATION:  Education details: PT answered pt's questions.  PT educated pt concerning dx, objective findings, POC, rationale of interventions, and relevant anatomy.  Person educated: Patient Education method: Explanation Education comprehension: verbalized understanding  HOME EXERCISE PROGRAM: Access Code: [email or web address] URL: [email or web address] Date: 08/11/2022 Prepared by: Riki Altes  Exercises - Hooklying Hamstring Stretch with Strap  - 1 x daily - 7 x weekly - 3 sets - 30 seconds hold - Supine Active Straight Leg Raise  - 1 x daily - 7 x weekly - 3 sets - 10 reps - Sidelying Hip Abduction  - 1 x daily - 7 x weekly - 3 sets - 10 reps - Sit to Stand Without Arm Support  - 1 x daily - 7 x weekly - 3 sets - 10 reps  ASSESSMENT:  CLINICAL IMPRESSION: Pt continues to require cuing to maintain knee extension with SLR and HS stretching.  Weakness observed in hip abductors with sidelying SLR. Able to progress step ups to 6" height with good tolerance. He required SBA as a precaution. No pain with this, but he did report general fatigue. Again required cues for full hip/trunk extension with sit to stands. Reports greater weakness in R hip with s/l abduction compared to L hip. Tactile cues were required for pt to maintain correct positioning with this. Will continue to progress as tolerated.   OBJECTIVE IMPAIRMENTS: Abnormal gait, decreased activity tolerance, decreased endurance, decreased mobility, difficulty walking, decreased strength, and pain.   ACTIVITY LIMITATIONS: sitting, stairs, and locomotion level  PARTICIPATION LIMITATIONS: shopping and community activity  PERSONAL FACTORS: 3+ comorbidities: CLL, DM Type 2, arthritis, and  peripheral neuropathy. Pt reports having balance deficits  are also affecting patient's functional outcome.   REHAB POTENTIAL: Good  CLINICAL DECISION MAKING: Stable/uncomplicated  EVALUATION COMPLEXITY: Low   GOALS:  SHORT TERM GOALS: Target date: 08/27/2022   Pt will be independent and compliant with HEP for improved pain, strength, ROM, and function.   Baseline: Goal status: INITIAL  2.  Pt will  report at least a 25% improvement in his mobility overall.  Baseline:  Goal status: INITIAL  3.  Pt will be able to perform a 6 inch step up with good form and good control. Baseline:  Goal status: INITIAL Target date:  09/03/2022  4.  Pt will report improved balance with his daily mobility and daily activities.  Baseline:  Goal status: INITIAL Target date:  09/03/2022    LONG TERM GOALS: Target date: 10/01/2022  Pt will demo improved 5x STS test to no > 16 sec for improved functional LE strength and improved performance of transfers.  Baseline:  Goal status: INITIAL  2.  Pt will demo improved R hip flex and knee flexion (seated) strength to 5/5 MMT and bilat hip abd strength by at least 5#'s (HHD) for improved tolerance with and performance of functional mobility.   Baseline:  Goal status: INITIAL  3.  Pt will report he is able to perform extended community ambulation distance without significant difficulty and without significant pain.   Baseline:  Goal status: INITIAL  4.  Pt will be able to perform stairs with a reciprocal gait with rail without significant difficulty and with good control.  Baseline:  Goal status: INITIAL    PLAN:  PT FREQUENCY: 2x/week  PT DURATION: 6-8 weeks though pt may be out of town at times during the cert period  PLANNED INTERVENTIONS: Therapeutic exercises, Therapeutic activity, Neuromuscular re-education, Balance training, Gait training, Patient/Family education, Self Care, Joint mobilization, Stair training, DME instructions, Aquatic  Therapy, Cryotherapy, scar mobilization, Taping, Manual therapy, and Re-evaluation.  PLAN FOR NEXT SESSION: Establish HEP.  Supine TrA contractions, supine marching with TrA, supine SLR, S/L hip abd, mini squats, step ups on 4 inch step, and S/L clams.  Balance exercises including standing with FT and airex exercises.  Assess HS flexibility and possible HS stretching.    Riki Altes, PTA  08/19/22 2:22 PM

## 2022-08-21 DIAGNOSIS — M5416 Radiculopathy, lumbar region: Secondary | ICD-10-CM | POA: Diagnosis not present

## 2022-08-21 DIAGNOSIS — Z6828 Body mass index (BMI) 28.0-28.9, adult: Secondary | ICD-10-CM | POA: Diagnosis not present

## 2022-08-22 ENCOUNTER — Ambulatory Visit (HOSPITAL_BASED_OUTPATIENT_CLINIC_OR_DEPARTMENT_OTHER): Payer: Medicare Other

## 2022-08-22 ENCOUNTER — Encounter (HOSPITAL_BASED_OUTPATIENT_CLINIC_OR_DEPARTMENT_OTHER): Payer: Self-pay

## 2022-08-22 DIAGNOSIS — R262 Difficulty in walking, not elsewhere classified: Secondary | ICD-10-CM

## 2022-08-22 DIAGNOSIS — M6281 Muscle weakness (generalized): Secondary | ICD-10-CM | POA: Diagnosis not present

## 2022-08-22 DIAGNOSIS — M5416 Radiculopathy, lumbar region: Secondary | ICD-10-CM | POA: Diagnosis not present

## 2022-08-22 NOTE — Therapy (Signed)
OUTPATIENT PHYSICAL THERAPY THORACOLUMBAR TREATMENT   Patient Name: Patrick Kidd MRN: [id number] DOB:August 13, 1945, 77 y.o., male Today's Date: 08/22/2022  END OF SESSION:  PT End of Session - 08/22/22 1531     Visit Number 4    Number of Visits 12    Date for PT Re-Evaluation 10/01/22    Authorization Type MCR A & B    PT Start Time 1517    PT Stop Time 1557    PT Time Calculation (min) 40 min    Activity Tolerance Patient tolerated treatment well    Behavior During Therapy [facility] for tasks assessed/performed              PT End of Session -  08/06/2022    Visit Number 1   Number of Visits 12   Date for PT Re-Evaluation 10/01/2022   Authorization Type MCR A & B   PT Start Time 1411   PT Stop Time 1502   PT Time Calculation (min) 51   Activity Tolerance Patient tolerated treatment well   Behavior During Therapy  [facility] for tasks assessed/performed     Past Medical History:  Diagnosis Date   Acrophobia    Alcoholic cirrhosis (HCC) 10/04/2015   Anemia    Arthritis    foot by big toe   BPH associated with nocturia    Cataract    removed both eyes   Cholecystitis 05/2022   tx Abx   Chronic cough    PMH of   CLL (chronic lymphocytic leukemia) (HCC)    COVID-19 12/2018   Diabetes mellitus without complication (HCC)    Diverticulosis 07/03/2010   Colonoscopy.    Duodenal ulcer 2017   Fallen arches    Bilateral   GERD (gastroesophageal reflux disease)    Gout    Granuloma annulare    Hx of adenomatous colonic polyps multiple   Hydrocele 2011   Large septated right hydrocele   Liver cyst    Liver lesion    Nonspecific elevation of levels of transaminase or lactic acid dehydrogenase (LDH)    Obesity    Peripheral neuropathy    Plantar fasciitis    PMH of   Portal hypertension (HCC) 2017   Prostate cancer (HCC)    Right shoulder pain 11/2017   Sleep apnea    no cpap, patient denies   Thiamine deficiency    Wears reading eyeglasses    WPW  (Wolff-Parkinson-White syndrome) 02/05/2019   Past Surgical History:  Procedure Laterality Date   BACK SURGERY  04/28/2022   CATARACT EXTRACTION, BILATERAL  12/2011   Dr Nile Riggs   COLONOSCOPY  2017   COLONOSCOPY W/ POLYPECTOMY  07/03/2010   2 adenomas, diverticulosis on right. Dr Leone Payor   CYSTOSCOPY N/A 03/15/2018   Procedure: Derinda Late;  Surgeon: Crista Elliot, MD;  Location: [facility];  Service: Urology;  Laterality: N/A;  NO SEEDS FOUND IN BLADDER   ESOPHAGOGASTRODUODENOSCOPY (EGD) WITH PROPOFOL N/A 01/08/2016   Procedure: ESOPHAGOGASTRODUODENOSCOPY (EGD) WITH PROPOFOL;  Surgeon: Meryl Dare, MD;  Location: [facility];  Service: Endoscopy;  Laterality: N/A;   FLEXIBLE SIGMOIDOSCOPY  2000   RADIOACTIVE SEED IMPLANT N/A 03/15/2018   Procedure: RADIOACTIVE SEED IMPLANT/BRACHYTHERAPY IMPLANT;  Surgeon: Crista Elliot, MD;  Location: [facility] [place];  Service: Urology;  Laterality: N/A;   73 SEEDS IMPLANTED   SIGMOIDOSCOPY     SPACE OAR INSTILLATION N/A 03/15/2018   Procedure: SPACE OAR INSTILLATION;  Surgeon: Modena Slater  D III, MD;  Location: [facility];  Service: Urology;  Laterality: N/A;   WISDOM TOOTH EXTRACTION     Patient Active Problem List   Diagnosis Date Noted   E coli bacteremia 06/01/2022   Sludge in gallbladder 05/30/2022   Hyperbilirubinemia 05/30/2022   Urinary incontinence 06/25/2021   Degenerative lumbar spinal stenosis 04/30/2021   Degenerative arthritis of knee, bilateral 03/27/2021   Chronic lymphocytic leukemia (HCC) 11/08/2020   Insulin-requiring or dependent type II diabetes mellitus (HCC) 09/25/2020   Stage 3a chronic kidney disease (HCC) 03/15/2020   Thiamine deficiency    Paroxysmal atrial fibrillation (HCC) 07/19/2019   Allergic rhinitis 06/09/2019   Erectile dysfunction due to diabetes mellitus (HCC) 06/09/2019   Overweight with body mass index (BMI) of 28 to 28.9 in adult 03/17/2019    WPW (Wolff-Parkinson-White syndrome) 02/05/2019   SBP (spontaneous bacterial peritonitis) (HCC)-December 2020,     Encephalopathy, hepatic (HCC)    Degenerative disc disease, cervical 07/21/2018   Cervical radiculopathy 07/16/2018   OAB (overactive bladder) 07/06/2018   Malignant neoplasm of prostate (HCC) 01/13/2018   Hyperlipidemia LDL goal <100 11/11/2017   Essential hypertension 11/11/2017   BPH associated with nocturia 11/11/2017   Erectile dysfunction due to arterial insufficiency 04/07/2017   Peripheral vascular disease (HCC) 06/25/2016   Alcoholic cirrhosis (HCC) 10/04/2015   Type 2 diabetes mellitus with complication, with long-term current use of insulin (HCC) 08/22/2015   Hypersomnolence 03/19/2014   Peripheral neuropathy 01/11/2013   Polyclonal gammopathy 01/11/2013   GERD 10/04/2009     REFERRING PROVIDER: Julio Sicks, MD   REFERRING DIAG: M54.16 (ICD-10-CM) - Radiculopathy, lumbar region  Rationale for Evaluation and Treatment: Rehabilitation  THERAPY DIAG:  Muscle weakness (generalized)  Difficulty in walking, not elsewhere classified  Radiculopathy, lumbar region  ONSET DATE: DOS 04/28/2022  SUBJECTIVE:                                                                                                                                                                                           SUBJECTIVE STATEMENT:  Has some L knee pain from meniscus injuy, but denies back pain. Has muscle soreness after last visit. Saw Dr. Dutch Quint for f/u who is pleased with his progress and released him his care per pt.   Eval: Pt reports having some deficits with ambulation and balance after being hospitalized in 2020 due to Covid.    Pt began having R sided lumbar pain which traveled down R LE to knee.  He also had N/T.  Pt received injections in 2023 which only provided relief for 3 days.  He also had PT in 2023  which helped a little.  Pt had lumbar surgery on 04/28/2022.  MD  note indicates pt underwent lumbar decompressive surgery at 3 levels.  Pt reports significant improvement in sx's since having surgery including his pain and radicular sx's being eliminated and his back feeling fine.  Pt states he had bone spurs at L4 and L5 pressing on the sciatic nerve which MD removed.  Pt saw MD on 06/05/2022 and MD ordered PT.   Pt states he has been released to gym exercises with no weight/lifting restrictions.  Pt states he had lifting restrictions for the 1st 60 days.  He has difficulty and pain with performing stairs.  He reports having weakness in LE's.  Pt has difficulty with ambulation.  He is walking 3/10 of a mile.  Pt reports having deficits with balance.  Pt uses a cane on uneven ground, though doesn't use a cane on level ground.  Pt has stiffness sitting in the car.   Pt walks in the pool 2-3x/wk.  Pt was in the hospital in May for cholecystitis.  Pt does still have some aggravating pain at gallbladder, not sharp pain.  PERTINENT HISTORY:  Lumbar decompression surgery on 04/27/2022 Lumbar MRI in 2023 showed mild retrolisthesis L1-2, mild anterolisthesis L3-4. And mild retrolisthesis L4-5. DM type 2 and Peripheral neuropathy CLL (chronic lymphocytic leukemia) Arthritis (X ray findings of OA in R hip and L knee) Prostate CA with radiation Alcoholic Cirrhosis WPW (Wolff-Parkinson-White syndrome), Pt states it doesn't affect him  PAIN:  Are you having pain? Yes NPRS:  0/10 current Location: bilat knees > ankles.  Pt denies pain in lumbar and R thigh.    PRECAUTIONS: Other: per surgery ; mild retrolisthesis and anterolisthesis ; CLL and peripheral neuropathy ; Pt reports having balance deficits  WEIGHT BEARING RESTRICTIONS: none indicated on MD note and pt reports none also  FALLS:  Has patient fallen in last 6 months? No  LIVING ENVIRONMENT: Lives with: lives with their spouse Lives in: 1 story home Stairs: 1 step with 1 rail Has following equipment at  home: cane  OCCUPATION: part time work.  Driving.  Pt does have to ambulate on uneven terrain.    PLOF: Independent.  Pt was able to ambulate 1.5 miles prior to increased lumbar and LE pain.  PATIENT GOALS: to be able to perform stairs   OBJECTIVE:   DIAGNOSTIC FINDINGS:  Lumbar MRI in 2023: -Alignment: Mild retrolisthesis L1-2. Mild anterolisthesis L3-4. Mild retrolisthesis L4-5. IMPRESSION: -Motion degraded study  -Negative for fracture or metastatic disease  -Lumbar spondylosis. Multilevel spinal and foraminal stenosis  X ray of R hip in 2023:  IMPRESSION: Mild osteoarthritis of the right hip.  X rays of L knee in 2023: IMPRESSION: Mild-to-moderate medial and patellofemoral osteoarthritis.  Chronic spurring at the patellar tendon insertion on the tibial tubercle.  PATIENT SURVEYS:  FOTO 44 with a goal of 52 at visit 13   COGNITION: Overall cognitive status: Within functional limits for tasks assessed     LUMBAR ROM:   Not tested   LOWER EXTREMITY MMT:    MMT Right eval Left eval  Hip flexion 4+/5 5/5  Hip extension    Hip abduction 33.0 36.3  Hip adduction    Hip internal rotation    Hip external rotation    Knee flexion 4/5 seated 5/5 / seated with knee pain  Knee extension 5/5 4+/5  Ankle dorsiflexion 5/5 5/5  Ankle plantarflexion WFL seated WFL seated  Ankle inversion  Ankle eversion     (Blank rows = not tested)   FUNCTIONAL TESTS:  5x STS test:  28.9 without UE support  GAIT: Assistive device utilized: None Level of assistance: Independent Comments: Decreased gait speed, decreased step length bilat, decreased foot clearance bilat.   TODAY'S TREATMENT:                                                                                                                                 Hamstring stretch with strap 3x30sec bil  Piriformis stretch 30sec x3 bil  Supine SLR 2x10bil  Sidelying abduction 2x10ea  Sit to stands (R LE behind)  2x10  Leg extension machine 30# 2x10 (cues for >R leg)  HSC machine 40# 3x10 (cues for >RLE)  Leg press (white) 130# 3x10 (cues for >RLE)  Step ups 6" 2x10 R (requires use of rail for balance)  Stair climbing-full stair case, bilateral rails-reciprocal up/down   PATIENT EDUCATION:  Education details: PT answered pt's questions.  PT educated pt concerning dx, objective findings, POC, rationale of interventions, and relevant anatomy.  Person educated: Patient Education method: Explanation Education comprehension: verbalized understanding  HOME EXERCISE PROGRAM: Access Code: [email or web address] URL: [email or web address] Date: 08/11/2022 Prepared by: Riki Altes  Exercises - Hooklying Hamstring Stretch with Strap  - 1 x daily - 7 x weekly - 3 sets - 30 seconds hold - Supine Active Straight Leg Raise  - 1 x daily - 7 x weekly - 3 sets - 10 reps - Sidelying Hip Abduction  - 1 x daily - 7 x weekly - 3 sets - 10 reps - Sit to Stand Without Arm Support  - 1 x daily - 7 x weekly - 3 sets - 10 reps  ASSESSMENT:  CLINICAL IMPRESSION: Pt continues to require cuing to maintain knee extension with SLR and HS stretching.  Also mild tactile cuing required with s/l hip abduction for proper pelvic positioning. Weakness observed in hip abductors with sidelying SLR. Improved self correction of posture with sit to stands today. Able to progress step ups to 6" height with good tolerance. Pt was able to climb full flight of stairs in clinic today with bilateral rail support. Increased R LE neuropathy reported throughout session, making step ups more challenging. Will continue to monitor this as we progress.   OBJECTIVE IMPAIRMENTS: Abnormal gait, decreased activity tolerance, decreased endurance, decreased mobility, difficulty walking, decreased strength, and pain.   ACTIVITY LIMITATIONS: sitting, stairs, and locomotion level  PARTICIPATION LIMITATIONS: shopping and community activity  PERSONAL  FACTORS: 3+ comorbidities: CLL, DM Type 2, arthritis, and peripheral neuropathy. Pt reports having balance deficits  are also affecting patient's functional outcome.   REHAB POTENTIAL: Good  CLINICAL DECISION MAKING: Stable/uncomplicated  EVALUATION COMPLEXITY: Low   GOALS:  SHORT TERM GOALS: Target date: 08/27/2022   Pt will be independent and compliant with HEP for improved pain, strength, ROM, and function.   Baseline: Goal status: INITIAL  2.  Pt will report at least a 25% improvement in his mobility overall.  Baseline:  Goal status: INITIAL  3.  Pt will be able to perform a 6 inch step up with good form and good control. Baseline:  Goal status: INITIAL Target date:  09/03/2022  4.  Pt will report improved balance with his daily mobility and daily activities.  Baseline:  Goal status: INITIAL Target date:  09/03/2022    LONG TERM GOALS: Target date: 10/01/2022  Pt will demo improved 5x STS test to no > 16 sec for improved functional LE strength and improved performance of transfers.  Baseline:  Goal status: INITIAL  2.  Pt will demo improved R hip flex and knee flexion (seated) strength to 5/5 MMT and bilat hip abd strength by at least 5#'s (HHD) for improved tolerance with and performance of functional mobility.   Baseline:  Goal status: INITIAL  3.  Pt will report he is able to perform extended community ambulation distance without significant difficulty and without significant pain.   Baseline:  Goal status: INITIAL  4.  Pt will be able to perform stairs with a reciprocal gait with rail without significant difficulty and with good control.  Baseline:  Goal status: INITIAL    PLAN:  PT FREQUENCY: 2x/week  PT DURATION: 6-8 weeks though pt may be out of town at times during the cert period  PLANNED INTERVENTIONS: Therapeutic exercises, Therapeutic activity, Neuromuscular re-education, Balance training, Gait training, Patient/Family education, Self Care,  Joint mobilization, Stair training, DME instructions, Aquatic Therapy, Cryotherapy, scar mobilization, Taping, Manual therapy, and Re-evaluation.  PLAN FOR NEXT SESSION: Establish HEP.  Supine TrA contractions, supine marching with TrA, supine SLR, S/L hip abd, mini squats, step ups on 4 inch step, and S/L clams.  Balance exercises including standing with FT and airex exercises.  Assess HS flexibility and possible HS stretching.    Riki Altes, PTA  08/22/22 4:02 PM

## 2022-08-25 ENCOUNTER — Ambulatory Visit (HOSPITAL_BASED_OUTPATIENT_CLINIC_OR_DEPARTMENT_OTHER): Payer: Medicare Other

## 2022-08-25 ENCOUNTER — Encounter (HOSPITAL_BASED_OUTPATIENT_CLINIC_OR_DEPARTMENT_OTHER): Payer: Self-pay

## 2022-08-25 DIAGNOSIS — M5416 Radiculopathy, lumbar region: Secondary | ICD-10-CM | POA: Diagnosis not present

## 2022-08-25 DIAGNOSIS — M6281 Muscle weakness (generalized): Secondary | ICD-10-CM

## 2022-08-25 DIAGNOSIS — R262 Difficulty in walking, not elsewhere classified: Secondary | ICD-10-CM

## 2022-08-25 NOTE — Therapy (Signed)
OUTPATIENT PHYSICAL THERAPY THORACOLUMBAR TREATMENT   Patient Name: Patrick Kidd MRN: [id number] DOB:[birth year], 77 y.o., male Today's Date: 08/25/2022  END OF SESSION:  PT End of Session - 08/25/22 1400     Visit Number 5    Number of Visits 12    Date for PT Re-Evaluation 10/01/22    Authorization Type MCR A & B    PT Start Time 1357    PT Stop Time 1430    PT Time Calculation (min) 33 min    Activity Tolerance Patient tolerated treatment well    Behavior During Therapy [facility] for tasks assessed/performed               PT End of Session -  08/06/2022    Visit Number 1   Number of Visits 12   Date for PT Re-Evaluation 10/01/2022   Authorization Type MCR A & B   PT Start Time 1411   PT Stop Time 1502   PT Time Calculation (min) 51   Activity Tolerance Patient tolerated treatment well   Behavior During Therapy  [facility] for tasks assessed/performed     Past Medical History:  Diagnosis Date   Acrophobia    Alcoholic cirrhosis (HCC) 10/04/2015   Anemia    Arthritis    foot by big toe   BPH associated with nocturia    Cataract    removed both eyes   Cholecystitis 05/2022   tx Abx   Chronic cough    PMH of   CLL (chronic lymphocytic leukemia) (HCC)    COVID-19 12/2018   Diabetes mellitus without complication (HCC)    Diverticulosis 07/03/2010   Colonoscopy.    Duodenal ulcer 2017   Fallen arches    Bilateral   GERD (gastroesophageal reflux disease)    Gout    Granuloma annulare    Hx of adenomatous colonic polyps multiple   Hydrocele 2011   Large septated right hydrocele   Liver cyst    Liver lesion    Nonspecific elevation of levels of transaminase or lactic acid dehydrogenase (LDH)    Obesity    Peripheral neuropathy    Plantar fasciitis    PMH of   Portal hypertension (HCC) 2017   Prostate cancer (HCC)    Right shoulder pain 11/2017   Sleep apnea    no cpap, patient denies   Thiamine deficiency    Wears reading eyeglasses    WPW  (Wolff-Parkinson-White syndrome) 02/05/2019   Past Surgical History:  Procedure Laterality Date   BACK SURGERY  04/28/2022   CATARACT EXTRACTION, BILATERAL  12/2011   Dr Nile Riggs   COLONOSCOPY  2017   COLONOSCOPY W/ POLYPECTOMY  07/03/2010   2 adenomas, diverticulosis on right. Dr Leone Payor   CYSTOSCOPY N/A 03/15/2018   Procedure: Derinda Late;  Surgeon: Crista Elliot, MD;  Location: [facility];  Service: Urology;  Laterality: N/A;  NO SEEDS FOUND IN BLADDER   ESOPHAGOGASTRODUODENOSCOPY (EGD) WITH PROPOFOL N/A 01/08/2016   Procedure: ESOPHAGOGASTRODUODENOSCOPY (EGD) WITH PROPOFOL;  Surgeon: Meryl Dare, MD;  Location: [facility];  Service: Endoscopy;  Laterality: N/A;   FLEXIBLE SIGMOIDOSCOPY  2000   RADIOACTIVE SEED IMPLANT N/A 03/15/2018   Procedure: RADIOACTIVE SEED IMPLANT/BRACHYTHERAPY IMPLANT;  Surgeon: Crista Elliot, MD;  Location: [facility];  Service: Urology;  Laterality: N/A;   73 SEEDS IMPLANTED   SIGMOIDOSCOPY     SPACE OAR INSTILLATION N/A 03/15/2018   Procedure: SPACE OAR INSTILLATION;  Surgeon: Alvester Morin,  Jannett Celestine, MD;  Location: [facility];  Service: Urology;  Laterality: N/A;   WISDOM TOOTH EXTRACTION     Patient Active Problem List   Diagnosis Date Noted   E coli bacteremia 06/01/2022   Sludge in gallbladder 05/30/2022   Hyperbilirubinemia 05/30/2022   Urinary incontinence 06/25/2021   Degenerative lumbar spinal stenosis 04/30/2021   Degenerative arthritis of knee, bilateral 03/27/2021   Chronic lymphocytic leukemia (HCC) 11/08/2020   Insulin-requiring or dependent type II diabetes mellitus (HCC) 09/25/2020   Stage 3a chronic kidney disease (HCC) 03/15/2020   Thiamine deficiency    Paroxysmal atrial fibrillation (HCC) 07/19/2019   Allergic rhinitis 06/09/2019   Erectile dysfunction due to diabetes mellitus (HCC) 06/09/2019   Overweight with body mass index (BMI) of 28 to 28.9 in adult 03/17/2019    WPW (Wolff-Parkinson-White syndrome) 02/05/2019   SBP (spontaneous bacterial peritonitis) (HCC)-December 2020,     Encephalopathy, hepatic (HCC)    Degenerative disc disease, cervical 07/21/2018   Cervical radiculopathy 07/16/2018   OAB (overactive bladder) 07/06/2018   Malignant neoplasm of prostate (HCC) 01/13/2018   Hyperlipidemia LDL goal <100 11/11/2017   Essential hypertension 11/11/2017   BPH associated with nocturia 11/11/2017   Erectile dysfunction due to arterial insufficiency 04/07/2017   Peripheral vascular disease (HCC) 06/25/2016   Alcoholic cirrhosis (HCC) 10/04/2015   Type 2 diabetes mellitus with complication, with long-term current use of insulin (HCC) 08/22/2015   Hypersomnolence 03/19/2014   Peripheral neuropathy 01/11/2013   Polyclonal gammopathy 01/11/2013   GERD 10/04/2009     REFERRING PROVIDER: Julio Sicks, MD   REFERRING DIAG: M54.16 (ICD-10-CM) - Radiculopathy, lumbar region  Rationale for Evaluation and Treatment: Rehabilitation  THERAPY DIAG:  Muscle weakness (generalized)  Difficulty in walking, not elsewhere classified  Radiculopathy, lumbar region  ONSET DATE: DOS 04/28/2022  SUBJECTIVE:                                                                                                                                                                                           SUBJECTIVE STATEMENT:  Pt has been walking a lot in the pool. "It's easier to get up out of bed."  Eval: Pt reports having some deficits with ambulation and balance after being hospitalized in 2020 due to Covid.    Pt began having R sided lumbar pain which traveled down R LE to knee.  He also had N/T.  Pt received injections in 2023 which only provided relief for 3 days.  He also had PT in 2023 which helped a little.  Pt had lumbar surgery on 04/28/2022.  MD note indicates pt underwent lumbar decompressive  surgery at 3 levels.  Pt reports significant improvement in sx's  since having surgery including his pain and radicular sx's being eliminated and his back feeling fine.  Pt states he had bone spurs at L4 and L5 pressing on the sciatic nerve which MD removed.  Pt saw MD on 06/05/2022 and MD ordered PT.   Pt states he has been released to gym exercises with no weight/lifting restrictions.  Pt states he had lifting restrictions for the 1st 60 days.  He has difficulty and pain with performing stairs.  He reports having weakness in LE's.  Pt has difficulty with ambulation.  He is walking 3/10 of a mile.  Pt reports having deficits with balance.  Pt uses a cane on uneven ground, though doesn't use a cane on level ground.  Pt has stiffness sitting in the car.   Pt walks in the pool 2-3x/wk.  Pt was in the hospital in May for cholecystitis.  Pt does still have some aggravating pain at gallbladder, not sharp pain.  PERTINENT HISTORY:  Lumbar decompression surgery on 04/27/2022 Lumbar MRI in 2023 showed mild retrolisthesis L1-2, mild anterolisthesis L3-4. And mild retrolisthesis L4-5. DM type 2 and Peripheral neuropathy CLL (chronic lymphocytic leukemia) Arthritis (X ray findings of OA in R hip and L knee) Prostate CA with radiation Alcoholic Cirrhosis WPW (Wolff-Parkinson-White syndrome), Pt states it doesn't affect him  PAIN:  Are you having pain? Yes NPRS:  0/10 current Location: bilat knees > ankles.  Pt denies pain in lumbar and R thigh.    PRECAUTIONS: Other: per surgery ; mild retrolisthesis and anterolisthesis ; CLL and peripheral neuropathy ; Pt reports having balance deficits  WEIGHT BEARING RESTRICTIONS: none indicated on MD note and pt reports none also  FALLS:  Has patient fallen in last 6 months? No  LIVING ENVIRONMENT: Lives with: lives with their spouse Lives in: 1 story home Stairs: 1 step with 1 rail Has following equipment at home: cane  OCCUPATION: part time work.  Driving.  Pt does have to ambulate on uneven terrain.    PLOF:  Independent.  Pt was able to ambulate 1.5 miles prior to increased lumbar and LE pain.  PATIENT GOALS: to be able to perform stairs   OBJECTIVE:   DIAGNOSTIC FINDINGS:  Lumbar MRI in 2023: -Alignment: Mild retrolisthesis L1-2. Mild anterolisthesis L3-4. Mild retrolisthesis L4-5. IMPRESSION: -Motion degraded study  -Negative for fracture or metastatic disease  -Lumbar spondylosis. Multilevel spinal and foraminal stenosis  X ray of R hip in 2023:  IMPRESSION: Mild osteoarthritis of the right hip.  X rays of L knee in 2023: IMPRESSION: Mild-to-moderate medial and patellofemoral osteoarthritis.  Chronic spurring at the patellar tendon insertion on the tibial tubercle.  PATIENT SURVEYS:  FOTO 44 with a goal of 52 at visit 13   COGNITION: Overall cognitive status: Within functional limits for tasks assessed     LUMBAR ROM:   Not tested   LOWER EXTREMITY MMT:    MMT Right eval Left eval  Hip flexion 4+/5 5/5  Hip extension    Hip abduction 33.0 36.3  Hip adduction    Hip internal rotation    Hip external rotation    Knee flexion 4/5 seated 5/5 / seated with knee pain  Knee extension 5/5 4+/5  Ankle dorsiflexion 5/5 5/5  Ankle plantarflexion WFL seated WFL seated  Ankle inversion    Ankle eversion     (Blank rows = not tested)   FUNCTIONAL TESTS:  5x STS test:  28.9 without UE support  GAIT: Assistive device utilized: None Level of assistance: Independent Comments: Decreased gait speed, decreased step length bilat, decreased foot clearance bilat.   TODAY'S TREATMENT:                                                                                                                                 Hamstring stretch with strap 3x30sec bil  Piriformis stretch 30sec x3 bil  Supine SLR 2x10bil  Sidelying abduction 2x10ea  Sit to stands (R LE behind) 2x10  Leg extension machine 30# 2x10 (cues for >R leg)  HSC machine 40# 2x10 (cues for >RLE)  Leg press  (black) 40# 2x10  Step ups 6" 2x10 ea (requires use of rail for balance)  Stair climbing-full stair case, single rail up, bilateral rails down -reciprocal up/down  PATIENT EDUCATION:  Education details: PT answered pt's questions.  PT educated pt concerning dx, objective findings, POC, rationale of interventions, and relevant anatomy.  Person educated: Patient Education method: Explanation Education comprehension: verbalized understanding  HOME EXERCISE PROGRAM: Access Code: [email or web address] URL: [email or web address] Date: 08/11/2022 Prepared by: Riki Altes  Exercises - Hooklying Hamstring Stretch with Strap  - 1 x daily - 7 x weekly - 3 sets - 30 seconds hold - Supine Active Straight Leg Raise  - 1 x daily - 7 x weekly - 3 sets - 10 reps - Sidelying Hip Abduction  - 1 x daily - 7 x weekly - 3 sets - 10 reps - Sit to Stand Without Arm Support  - 1 x daily - 7 x weekly - 3 sets - 10 reps  ASSESSMENT:  CLINICAL IMPRESSION: Pt able to climb stairs using single railing today, which was challenging. SBA required with this. Good tolerance for LE machine strengthening. Some cues still required for slower pace and utilizing full range with exercises. Will continue to progress as tolerated.   OBJECTIVE IMPAIRMENTS: Abnormal gait, decreased activity tolerance, decreased endurance, decreased mobility, difficulty walking, decreased strength, and pain.   ACTIVITY LIMITATIONS: sitting, stairs, and locomotion level  PARTICIPATION LIMITATIONS: shopping and community activity  PERSONAL FACTORS: 3+ comorbidities: CLL, DM Type 2, arthritis, and peripheral neuropathy. Pt reports having balance deficits  are also affecting patient's functional outcome.   REHAB POTENTIAL: Good  CLINICAL DECISION MAKING: Stable/uncomplicated  EVALUATION COMPLEXITY: Low   GOALS:  SHORT TERM GOALS: Target date: 08/27/2022   Pt will be independent and compliant with HEP for improved pain, strength,  ROM, and function.   Baseline: Goal status: MET 7/22  2.  Pt will report at least a 25% improvement in his mobility overall.  Baseline:  Goal status: INITIAL  3.  Pt will be able to perform a 6 inch step up with good form and good control. Baseline:  Goal status: INITIAL Target date:  09/03/2022  4.  Pt will report improved balance with his daily mobility and daily activities.  Baseline:  Goal status: INITIAL Target  date:  09/03/2022    LONG TERM GOALS: Target date: 10/01/2022  Pt will demo improved 5x STS test to no > 16 sec for improved functional LE strength and improved performance of transfers.  Baseline:  Goal status: INITIAL  2.  Pt will demo improved R hip flex and knee flexion (seated) strength to 5/5 MMT and bilat hip abd strength by at least 5#'s (HHD) for improved tolerance with and performance of functional mobility.   Baseline:  Goal status: INITIAL  3.  Pt will report he is able to perform extended community ambulation distance without significant difficulty and without significant pain.   Baseline:  Goal status: INITIAL  4.  Pt will be able to perform stairs with a reciprocal gait with rail without significant difficulty and with good control.  Baseline:  Goal status: INITIAL    PLAN:  PT FREQUENCY: 2x/week  PT DURATION: 6-8 weeks though pt may be out of town at times during the cert period  PLANNED INTERVENTIONS: Therapeutic exercises, Therapeutic activity, Neuromuscular re-education, Balance training, Gait training, Patient/Family education, Self Care, Joint mobilization, Stair training, DME instructions, Aquatic Therapy, Cryotherapy, scar mobilization, Taping, Manual therapy, and Re-evaluation.  PLAN FOR NEXT SESSION: Establish HEP.  Supine TrA contractions, supine marching with TrA, supine SLR, S/L hip abd, mini squats, step ups on 4 inch step, and S/L clams.  Balance exercises including standing with FT and airex exercises.  Assess HS flexibility and  possible HS stretching.    Riki Altes, PTA  08/25/22 3:14 PM

## 2022-08-27 ENCOUNTER — Ambulatory Visit (HOSPITAL_BASED_OUTPATIENT_CLINIC_OR_DEPARTMENT_OTHER): Payer: Medicare Other | Admitting: Physical Therapy

## 2022-08-27 DIAGNOSIS — R262 Difficulty in walking, not elsewhere classified: Secondary | ICD-10-CM | POA: Diagnosis not present

## 2022-08-27 DIAGNOSIS — M6281 Muscle weakness (generalized): Secondary | ICD-10-CM

## 2022-08-27 DIAGNOSIS — M5416 Radiculopathy, lumbar region: Secondary | ICD-10-CM

## 2022-08-27 NOTE — Therapy (Signed)
OUTPATIENT PHYSICAL THERAPY THORACOLUMBAR TREATMENT   Patient Name: Patrick Kidd MRN: [id number] DOB:09-06-1945, 77 y.o., male Today's Date: 08/28/2022  END OF SESSION:      PT End of Session -  08/27/2022    Visit Number 6   Number of Visits 12   Date for PT Re-Evaluation 10/01/2022   Authorization Type MCR A & B   PT Start Time 1454   PT Stop Time 1530   PT Time Calculation (min) 36   Activity Tolerance Patient tolerated treatment well   Behavior During Therapy  [facility] for tasks assessed/performed     Past Medical History:  Diagnosis Date   Acrophobia    Alcoholic cirrhosis (HCC) 10/04/2015   Anemia    Arthritis    foot by big toe   BPH associated with nocturia    Cataract    removed both eyes   Cholecystitis 05/2022   tx Abx   Chronic cough    PMH of   CLL (chronic lymphocytic leukemia) (HCC)    COVID-19 12/2018   Diabetes mellitus without complication (HCC)    Diverticulosis 07/03/2010   Colonoscopy.    Duodenal ulcer 2017   Fallen arches    Bilateral   GERD (gastroesophageal reflux disease)    Gout    Granuloma annulare    Hx of adenomatous colonic polyps multiple   Hydrocele 2011   Large septated right hydrocele   Liver cyst    Liver lesion    Nonspecific elevation of levels of transaminase or lactic acid dehydrogenase (LDH)    Obesity    Peripheral neuropathy    Plantar fasciitis    PMH of   Portal hypertension (HCC) 2017   Prostate cancer (HCC)    Right shoulder pain 11/2017   Sleep apnea    no cpap, patient denies   Thiamine deficiency    Wears reading eyeglasses    WPW (Wolff-Parkinson-White syndrome) 02/05/2019   Past Surgical History:  Procedure Laterality Date   BACK SURGERY  04/28/2022   CATARACT EXTRACTION, BILATERAL  12/2011   Dr Nile Riggs   COLONOSCOPY  2017   COLONOSCOPY W/ POLYPECTOMY  07/03/2010   2 adenomas, diverticulosis on right. Dr Leone Payor   CYSTOSCOPY N/A 03/15/2018   Procedure: Derinda Late;  Surgeon:  Crista Elliot, MD;  Location: [facility];  Service: Urology;  Laterality: N/A;  NO SEEDS FOUND IN BLADDER   ESOPHAGOGASTRODUODENOSCOPY (EGD) WITH PROPOFOL N/A 01/08/2016   Procedure: ESOPHAGOGASTRODUODENOSCOPY (EGD) WITH PROPOFOL;  Surgeon: Meryl Dare, MD;  Location: [facility];  Service: Endoscopy;  Laterality: N/A;   FLEXIBLE SIGMOIDOSCOPY  2000   RADIOACTIVE SEED IMPLANT N/A 03/15/2018   Procedure: RADIOACTIVE SEED IMPLANT/BRACHYTHERAPY IMPLANT;  Surgeon: Crista Elliot, MD;  Location: [facility];  Service: Urology;  Laterality: N/A;   73 SEEDS IMPLANTED   SIGMOIDOSCOPY     SPACE OAR INSTILLATION N/A 03/15/2018   Procedure: SPACE OAR INSTILLATION;  Surgeon: Crista Elliot, MD;  Location: [facility];  Service: Urology;  Laterality: N/A;   WISDOM TOOTH EXTRACTION     Patient Active Problem List   Diagnosis Date Noted   E coli bacteremia 06/01/2022   Sludge in gallbladder 05/30/2022   Hyperbilirubinemia 05/30/2022   Urinary incontinence 06/25/2021   Degenerative lumbar spinal stenosis 04/30/2021   Degenerative arthritis of knee, bilateral 03/27/2021   Chronic lymphocytic leukemia (HCC) 11/08/2020   Insulin-requiring or dependent type II diabetes mellitus (HCC) 09/25/2020  Stage 3a chronic kidney disease (HCC) 03/15/2020   Thiamine deficiency    Paroxysmal atrial fibrillation (HCC) 07/19/2019   Allergic rhinitis 06/09/2019   Erectile dysfunction due to diabetes mellitus (HCC) 06/09/2019   Overweight with body mass index (BMI) of 28 to 28.9 in adult 03/17/2019   WPW (Wolff-Parkinson-White syndrome) 02/05/2019   SBP (spontaneous bacterial peritonitis) (HCC)-December 2020,     Encephalopathy, hepatic (HCC)    Degenerative disc disease, cervical 07/21/2018   Cervical radiculopathy 07/16/2018   OAB (overactive bladder) 07/06/2018   Malignant neoplasm of prostate (HCC) 01/13/2018   Hyperlipidemia LDL goal <100 11/11/2017    Essential hypertension 11/11/2017   BPH associated with nocturia 11/11/2017   Erectile dysfunction due to arterial insufficiency 04/07/2017   Peripheral vascular disease (HCC) 06/25/2016   Alcoholic cirrhosis (HCC) 10/04/2015   Type 2 diabetes mellitus with complication, with long-term current use of insulin (HCC) 08/22/2015   Hypersomnolence 03/19/2014   Peripheral neuropathy 01/11/2013   Polyclonal gammopathy 01/11/2013   GERD 10/04/2009     REFERRING PROVIDER: Julio Sicks, MD   REFERRING DIAG: M54.16 (ICD-10-CM) - Radiculopathy, lumbar region  Rationale for Evaluation and Treatment: Rehabilitation  THERAPY DIAG:  Muscle weakness (generalized)  Difficulty in walking, not elsewhere classified  Radiculopathy, lumbar region  ONSET DATE: DOS 04/28/2022  SUBJECTIVE:                                                                                                                                                                                           SUBJECTIVE STATEMENT:  Pt is 17 weeks and 3 days s/p lumbar decompression surgery.  Pt reports improved performance of stairs.  Pt denies any adverse effects after prior Rx.  Pt denies pain currently, just has some soreness in LE's/knees.  Pt states MD has released from his care.         PERTINENT HISTORY:  Lumbar decompression surgery on 04/27/2022 Lumbar MRI in 2023 showed mild retrolisthesis L1-2, mild anterolisthesis L3-4. And mild retrolisthesis L4-5. DM type 2 and Peripheral neuropathy CLL (chronic lymphocytic leukemia) Arthritis (X ray findings of OA in R hip and L knee) Prostate CA with radiation Alcoholic Cirrhosis WPW (Wolff-Parkinson-White syndrome), Pt states it doesn't affect him  PAIN:  Are you having pain? Yes NPRS:  0/10 current Location: bilat knees > ankles.  Pt denies pain in lumbar and R thigh.    PRECAUTIONS: Other: per surgery ; mild retrolisthesis and anterolisthesis ; CLL and peripheral neuropathy ;  Pt reports having balance deficits  WEIGHT BEARING RESTRICTIONS: none indicated on MD note and pt reports none also  FALLS:  Has patient fallen in last  6 months? No  LIVING ENVIRONMENT: Lives with: lives with their spouse Lives in: 1 story home Stairs: 1 step with 1 rail Has following equipment at home: cane  OCCUPATION: part time work.  Driving.  Pt does have to ambulate on uneven terrain.    PLOF: Independent.  Pt was able to ambulate 1.5 miles prior to increased lumbar and LE pain.  PATIENT GOALS: to be able to perform stairs   OBJECTIVE:   DIAGNOSTIC FINDINGS:  Lumbar MRI in 2023: -Alignment: Mild retrolisthesis L1-2. Mild anterolisthesis L3-4. Mild retrolisthesis L4-5. IMPRESSION: -Motion degraded study  -Negative for fracture or metastatic disease  -Lumbar spondylosis. Multilevel spinal and foraminal stenosis  X ray of R hip in 2023:  IMPRESSION: Mild osteoarthritis of the right hip.  X rays of L knee in 2023: IMPRESSION: Mild-to-moderate medial and patellofemoral osteoarthritis.  Chronic spurring at the patellar tendon insertion on the tibial tubercle.   TODAY'S TREATMENT:                                                                                                                                 FUNCTIONAL TESTS:  5x STS test:  Initial / Current:  28.9 without UE support / 19.47 sec without UE support     Nustep L3 bilat Ues/LE's x 5 mins    Sidelying abduction 2x10ea  Leg extension machine 30# 2x10 (cues for >R leg)  HSC machine 40# 2x10 (cues for >RLE)  Leg press (black) 40# 2x10  S/L clams 2x10    Stair climbing:  full stair case:  ascending with reciprocal gait with bilat rails and descending with slow step through gait with bilat rails   PATIENT EDUCATION:  Education details: PT answered pt's questions.  PT educated pt concerning dx, POC, rationale of interventions, and relevant anatomy.  Person educated: Patient Education method:  Explanation Education comprehension: verbalized understanding  HOME EXERCISE PROGRAM: Access Code: [email or web address] URL: [email or web address] Date: 08/11/2022 Prepared by: Riki Altes  Exercises - Hooklying Hamstring Stretch with Strap  - 1 x daily - 7 x weekly - 3 sets - 30 seconds hold - Supine Active Straight Leg Raise  - 1 x daily - 7 x weekly - 3 sets - 10 reps - Sidelying Hip Abduction  - 1 x daily - 7 x weekly - 3 sets - 10 reps - Sit to Stand Without Arm Support  - 1 x daily - 7 x weekly - 3 sets - 10 reps  ASSESSMENT:  CLINICAL IMPRESSION: Pt reports improved performance of stairs.  He was able to perform reciprocal gait with bilat hand rails ascending and has more difficulty with descending the stairs.  Pt demonstrate improved functional LE strength and performance of transfers as evidenced by 9 second improvement on 5x STS test.  Pt tolerated gym exercises well and states he has been performing exercises at the [facility] also.  Pt performed exercises well with cuing and instruction in correct  form.  Pt had some difficulty with S/L hip abduction and required instruction in maintaining correct form.  He responded well to Rx having no increased pain after Rx.     OBJECTIVE IMPAIRMENTS: Abnormal gait, decreased activity tolerance, decreased endurance, decreased mobility, difficulty walking, decreased strength, and pain.   ACTIVITY LIMITATIONS: sitting, stairs, and locomotion level  PARTICIPATION LIMITATIONS: shopping and community activity  PERSONAL FACTORS: 3+ comorbidities: CLL, DM Type 2, arthritis, and peripheral neuropathy. Pt reports having balance deficits  are also affecting patient's functional outcome.   REHAB POTENTIAL: Good  CLINICAL DECISION MAKING: Stable/uncomplicated  EVALUATION COMPLEXITY: Low   GOALS:  SHORT TERM GOALS: Target date: 08/27/2022   Pt will be independent and compliant with HEP for improved pain, strength, ROM, and function.    Baseline: Goal status: MET 7/22  2.  Pt will report at least a 25% improvement in his mobility overall.  Baseline:  Goal status: INITIAL  3.  Pt will be able to perform a 6 inch step up with good form and good control. Baseline:  Goal status: INITIAL Target date:  09/03/2022  4.  Pt will report improved balance with his daily mobility and daily activities.  Baseline:  Goal status: INITIAL Target date:  09/03/2022    LONG TERM GOALS: Target date: 10/01/2022  Pt will demo improved 5x STS test to no > 16 sec for improved functional LE strength and improved performance of transfers.  Baseline:  Goal status: Progressing--7/24  2.  Pt will demo improved R hip flex and knee flexion (seated) strength to 5/5 MMT and bilat hip abd strength by at least 5#'s (HHD) for improved tolerance with and performance of functional mobility.   Baseline:  Goal status: INITIAL  3.  Pt will report he is able to perform extended community ambulation distance without significant difficulty and without significant pain.   Baseline:  Goal status: INITIAL  4.  Pt will be able to perform stairs with a reciprocal gait with rail without significant difficulty and with good control.  Baseline:  Goal status: INITIAL    PLAN:  PT FREQUENCY: 2x/week  PT DURATION: 6-8 weeks though pt may be out of town at times during the cert period  PLANNED INTERVENTIONS: Therapeutic exercises, Therapeutic activity, Neuromuscular re-education, Balance training, Gait training, Patient/Family education, Self Care, Joint mobilization, Stair training, DME instructions, Aquatic Therapy, Cryotherapy, scar mobilization, Taping, Manual therapy, and Re-evaluation.  PLAN FOR NEXT SESSION: Cont with core and LE strengthening.  Balance exercises including standing with FT and airex exercises.  Establish HEP.  Supine TrA contractions, supine marching with TrA, supine SLR, S/L hip abd, mini squats, step ups on 4 inch step, and S/L clams.       Audie Clear III PT, DPT 08/28/22 11:41 PM

## 2022-08-28 ENCOUNTER — Encounter (HOSPITAL_BASED_OUTPATIENT_CLINIC_OR_DEPARTMENT_OTHER): Payer: Self-pay | Admitting: Physical Therapy

## 2022-08-28 NOTE — Progress Notes (Deleted)
NEUROLOGY FOLLOW UP OFFICE NOTE  JAQUESE IRVING [id number]  Assessment/Plan:   Diabetic polyneuropathy Muscle spasms involving buttock   gabapentin 300mg  in AM and 600mg  at bedtime For muscle spasms, cyclobenzaprine 10mg  three times daily as needed Follow up one year or as needed.   Subjective:  Patrick Kidd is a 78 y.o. right-handed male with DM 2 and lumbar spinal stenosis with radiculopathy who follows up for diabetic polyneuropathy.    UPDATE: Taking:  gabapentin 300mg  in AM and 600mg  at bedtime; cyclobenzaprine 10mg  TID PRN  ***   HISTORY: Onset of symptoms began in 2010.  He describes initially experiencing numbness on the bottom of both feet, in which recently it has spread to the ankles. It is also associated with an uncomfortable tingling sensation. It is not painful but it is uncomfortable and usually bothers him at night.  He doesn't really notice it if he is active.  He is concerned about his balance.  The first time, he was getting out of a boat and felt that his right leg was not strong enough to hold his weight and he fell to his knees. The second time, he tripped over a chair and fell down to his knees.  He felt that his leg just gave out when it paired his weight. He is particularly concerned, because he does enjoy riding on a boat and dancing.  More recently, he stood up from a chair at a game and felt shaky and unsteady.  He has not been exercising as much due to fear of imbalance.  He denies any pain radiating down the legs.  He has pain in his right big toe after an 8 pound frozen duck breast fell on it.  He has tingling in the finger tips.   Labs from 08/04/12 revealed ESR 29, ANA negative, Lyme negative.  SPEP revealed possible faint restricted band in the gamma region, IFE revealed polyclonal gammopathy involving IgA and IgM.  Prior labs last month revealed Hgb A1c 5.7, TSH 2.42, RPR non-reactive, serum glucose 131.  CBC and CMP otherwise  unremarkable.  B12 from 5/12 was 1293.  Fasting glucose was 143 and 2 hour glucose tolerance test was 341.  However, he was diagnosed with type 2 diabetes after Hgb A1c from 01/09/16 was found to be 8.4.  PAST MEDICAL HISTORY: Past Medical History:  Diagnosis Date   Acrophobia    Alcoholic cirrhosis (HCC) 10/04/2015   Anemia    Arthritis    foot by big toe   BPH associated with nocturia    Cataract    removed both eyes   Cholecystitis 05/2022   tx Abx   Chronic cough    PMH of   CLL (chronic lymphocytic leukemia) (HCC)    COVID-19 12/2018   Diabetes mellitus without complication (HCC)    Diverticulosis 07/03/2010   Colonoscopy.    Duodenal ulcer 2017   Fallen arches    Bilateral   GERD (gastroesophageal reflux disease)    Gout    Granuloma annulare    Hx of adenomatous colonic polyps multiple   Hydrocele 2011   Large septated right hydrocele   Liver cyst    Liver lesion    Nonspecific elevation of levels of transaminase or lactic acid dehydrogenase (LDH)    Obesity    Peripheral neuropathy    Plantar fasciitis    PMH of   Portal hypertension (HCC) 2017   Prostate cancer (HCC)    Right shoulder pain 11/2017  Sleep apnea    no cpap, patient denies   Thiamine deficiency    Wears reading eyeglasses    WPW (Wolff-Parkinson-White syndrome) 02/05/2019    MEDICATIONS: Current Outpatient Medications on File Prior to Visit  Medication Sig Dispense Refill   acetaminophen (TYLENOL) 325 MG tablet Take 650 mg by mouth daily as needed for moderate pain.     Continuous Blood Gluc Receiver (FREESTYLE LIBRE 3 READER) DEVI 1 Act by Does not apply route daily. 1 each 3   Continuous Blood Gluc Sensor (FREESTYLE LIBRE 3 SENSOR) MISC 1 Act by Does not apply route daily. Place 1 sensor on the skin every 14 days. Use to check glucose continuously 2 each 5   gabapentin (NEURONTIN) 300 MG capsule Take 1 capsule (300 mg total) by mouth 3 (three) times daily. (Patient taking differently:  Take 300 mg by mouth 2 (two) times daily.) 270 capsule 1   insulin glargine, 2 Unit Dial, (TOUJEO MAX SOLOSTAR) 300 UNIT/ML Solostar Pen Inject 50 Units into the skin daily. Via SANOFI pt assistance 15 mL 1   Insulin Pen Needle (PEN NEEDLES) 32G X 5 MM MISC Inject 1 Act into the skin daily. Use to administer insulin. Dx E11.9 Dispense based on insurance preference. 100 each 1   Lancets (ACCU-CHEK MULTICLIX) lancets Use to check sugar daily. DX: E11.8 100 each 12   metFORMIN (GLUCOPHAGE) 500 MG tablet TAKE 1 TABLET BY MOUTH 2 TIMES DAILY WITH A MEAL. 180 tablet 0   pantoprazole (PROTONIX) 40 MG tablet TAKE 1 TABLET 30 MINUTES BEFORE MEALS (BREAKFAST AND SUPPER) TWICE A DAY (Patient taking differently: Take 40 mg by mouth 2 (two) times daily. TAKE 1 TABLET 30 MINUTES BEFORE MEALS (BREAKFAST AND SUPPER) TWICE A DAY) 180 tablet 0   No current facility-administered medications on file prior to visit.    ALLERGIES: Allergies  Allergen Reactions   Rifaximin Nausea And Vomiting    Required EMS visit, although patient did not go to hospital   Beta Adrenergic Blockers     Likely WPW.  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  Use with caution.    FAMILY HISTORY: Family History  Problem Relation Age of Onset   Lung cancer Mother        smoker   Leukemia Father        Acute myelocytic   Diabetes Neg Hx    Stroke Neg Hx    Heart disease Neg Hx    Colon cancer Neg Hx    Colon polyps Neg Hx    Esophageal cancer Neg Hx    Rectal cancer Neg Hx    Stomach cancer Neg Hx       Objective:  *** General: No acute distress.  Patient appears well-groomed.   Head:  Normocephalic/atraumatic Eyes:  Fundi examined but not visualized Neck: supple, no paraspinal tenderness, full range of motion Heart:  Regular rate and rhythm Neurological Exam: ***   Shon Millet, DO  CC: Sanda Linger, MD

## 2022-08-29 ENCOUNTER — Encounter: Payer: Self-pay | Admitting: Neurology

## 2022-08-29 ENCOUNTER — Ambulatory Visit: Payer: Medicare Other | Admitting: Neurology

## 2022-08-29 DIAGNOSIS — Z029 Encounter for administrative examinations, unspecified: Secondary | ICD-10-CM

## 2022-09-01 ENCOUNTER — Ambulatory Visit (HOSPITAL_BASED_OUTPATIENT_CLINIC_OR_DEPARTMENT_OTHER): Payer: Medicare Other

## 2022-09-01 ENCOUNTER — Encounter (HOSPITAL_BASED_OUTPATIENT_CLINIC_OR_DEPARTMENT_OTHER): Payer: Self-pay

## 2022-09-01 ENCOUNTER — Encounter (HOSPITAL_BASED_OUTPATIENT_CLINIC_OR_DEPARTMENT_OTHER): Payer: Medicare Other

## 2022-09-01 DIAGNOSIS — R262 Difficulty in walking, not elsewhere classified: Secondary | ICD-10-CM

## 2022-09-01 DIAGNOSIS — M5416 Radiculopathy, lumbar region: Secondary | ICD-10-CM | POA: Diagnosis not present

## 2022-09-01 DIAGNOSIS — M6281 Muscle weakness (generalized): Secondary | ICD-10-CM

## 2022-09-01 NOTE — Therapy (Signed)
OUTPATIENT PHYSICAL THERAPY THORACOLUMBAR TREATMENT   Patient Name: RASHAWD HILDEBRANDT MRN: [id number] DOB:11/19/1945, 77 y.o., male Today's Date: 09/01/2022  END OF SESSION:  PT End of Session - 09/01/22 1438     Visit Number 7    Date for PT Re-Evaluation 10/01/22    Authorization Type MCR A & B    PT Start Time 1353    PT Stop Time 1429    PT Time Calculation (min) 36 min    Activity Tolerance Patient tolerated treatment well    Behavior During Therapy [facility] for tasks assessed/performed                PT End of Session -  08/27/2022    Visit Number 6   Number of Visits 12   Date for PT Re-Evaluation 10/01/2022   Authorization Type MCR A & B   PT Start Time 1454   PT Stop Time 1530   PT Time Calculation (min) 36   Activity Tolerance Patient tolerated treatment well   Behavior During Therapy  [facility] for tasks assessed/performed     Past Medical History:  Diagnosis Date   Acrophobia    Alcoholic cirrhosis (HCC) 10/04/2015   Anemia    Arthritis    foot by big toe   BPH associated with nocturia    Cataract    removed both eyes   Cholecystitis 05/2022   tx Abx   Chronic cough    PMH of   CLL (chronic lymphocytic leukemia) (HCC)    COVID-19 12/2018   Diabetes mellitus without complication (HCC)    Diverticulosis 07/03/2010   Colonoscopy.    Duodenal ulcer 2017   Fallen arches    Bilateral   GERD (gastroesophageal reflux disease)    Gout    Granuloma annulare    Hx of adenomatous colonic polyps multiple   Hydrocele 2011   Large septated right hydrocele   Liver cyst    Liver lesion    Nonspecific elevation of levels of transaminase or lactic acid dehydrogenase (LDH)    Obesity    Peripheral neuropathy    Plantar fasciitis    PMH of   Portal hypertension (HCC) 2017   Prostate cancer (HCC)    Right shoulder pain 11/2017   Sleep apnea    no cpap, patient denies   Thiamine deficiency    Wears reading eyeglasses    WPW (Wolff-Parkinson-White  syndrome) 02/05/2019   Past Surgical History:  Procedure Laterality Date   BACK SURGERY  04/28/2022   CATARACT EXTRACTION, BILATERAL  12/2011   Dr Nile Riggs   COLONOSCOPY  2017   COLONOSCOPY W/ POLYPECTOMY  07/03/2010   2 adenomas, diverticulosis on right. Dr Leone Payor   CYSTOSCOPY N/A 03/15/2018   Procedure: Derinda Late;  Surgeon: Crista Elliot, MD;  Location: [facility];  Service: Urology;  Laterality: N/A;  NO SEEDS FOUND IN BLADDER   ESOPHAGOGASTRODUODENOSCOPY (EGD) WITH PROPOFOL N/A 01/08/2016   Procedure: ESOPHAGOGASTRODUODENOSCOPY (EGD) WITH PROPOFOL;  Surgeon: Meryl Dare, MD;  Location: [facility];  Service: Endoscopy;  Laterality: N/A;   FLEXIBLE SIGMOIDOSCOPY  2000   RADIOACTIVE SEED IMPLANT N/A 03/15/2018   Procedure: RADIOACTIVE SEED IMPLANT/BRACHYTHERAPY IMPLANT;  Surgeon: Crista Elliot, MD;  Location: [facility];  Service: Urology;  Laterality: N/A;   73 SEEDS IMPLANTED   SIGMOIDOSCOPY     SPACE OAR INSTILLATION N/A 03/15/2018   Procedure: SPACE OAR INSTILLATION;  Surgeon: Crista Elliot, MD;  [facility];  Service: Urology;  Laterality: N/A;   WISDOM TOOTH EXTRACTION     Patient Active Problem List   Diagnosis Date Noted   E coli bacteremia 06/01/2022   Sludge in gallbladder 05/30/2022   Hyperbilirubinemia 05/30/2022   Urinary incontinence 06/25/2021   Degenerative lumbar spinal stenosis 04/30/2021   Degenerative arthritis of knee, bilateral 03/27/2021   Chronic lymphocytic leukemia (HCC) 11/08/2020   Insulin-requiring or dependent type II diabetes mellitus (HCC) 09/25/2020   Stage 3a chronic kidney disease (HCC) 03/15/2020   Thiamine deficiency    Paroxysmal atrial fibrillation (HCC) 07/19/2019   Allergic rhinitis 06/09/2019   Erectile dysfunction due to diabetes mellitus (HCC) 06/09/2019   Overweight with body mass index (BMI) of 28 to 28.9 in adult 03/17/2019   WPW  (Wolff-Parkinson-White syndrome) 02/05/2019   SBP (spontaneous bacterial peritonitis) (HCC)-December 2020,     Encephalopathy, hepatic (HCC)    Degenerative disc disease, cervical 07/21/2018   Cervical radiculopathy 07/16/2018   OAB (overactive bladder) 07/06/2018   Malignant neoplasm of prostate (HCC) 01/13/2018   Hyperlipidemia LDL goal <100 11/11/2017   Essential hypertension 11/11/2017   BPH associated with nocturia 11/11/2017   Erectile dysfunction due to arterial insufficiency 04/07/2017   Peripheral vascular disease (HCC) 06/25/2016   Alcoholic cirrhosis (HCC) 10/04/2015   Type 2 diabetes mellitus with complication, with long-term current use of insulin (HCC) 08/22/2015   Hypersomnolence 03/19/2014   Peripheral neuropathy 01/11/2013   Polyclonal gammopathy 01/11/2013   GERD 10/04/2009     REFERRING PROVIDER: Julio Sicks, MD   REFERRING DIAG: M54.16 (ICD-10-CM) - Radiculopathy, lumbar region  Rationale for Evaluation and Treatment: Rehabilitation  THERAPY DIAG:  Muscle weakness (generalized)  Difficulty in walking, not elsewhere classified  Radiculopathy, lumbar region  ONSET DATE: DOS 04/28/2022  SUBJECTIVE:                                                                                                                                                                                           SUBJECTIVE STATEMENT:  Pt reports he had to stand for ~15 minutes at a social function and both knees gave out simultaneously. "I was lucky there was a chair beside me."    PERTINENT HISTORY:  Lumbar decompression surgery on 04/27/2022 Lumbar MRI in 2023 showed mild retrolisthesis L1-2, mild anterolisthesis L3-4. And mild retrolisthesis L4-5. DM type 2 and Peripheral neuropathy CLL (chronic lymphocytic leukemia) Arthritis (X ray findings of OA in R hip and L knee) Prostate CA with radiation Alcoholic Cirrhosis WPW (Wolff-Parkinson-White syndrome), Pt states it doesn't  affect him  PAIN:  Are you having pain? Yes NPRS:  0/10  current Location: bilat knees > ankles.  Pt denies pain in lumbar and R thigh.    PRECAUTIONS: Other: per surgery ; mild retrolisthesis and anterolisthesis ; CLL and peripheral neuropathy ; Pt reports having balance deficits  WEIGHT BEARING RESTRICTIONS: none indicated on MD note and pt reports none also  FALLS:  Has patient fallen in last 6 months? No  LIVING ENVIRONMENT: Lives with: lives with their spouse Lives in: 1 story home Stairs: 1 step with 1 rail Has following equipment at home: cane  OCCUPATION: part time work.  Driving.  Pt does have to ambulate on uneven terrain.    PLOF: Independent.  Pt was able to ambulate 1.5 miles prior to increased lumbar and LE pain.  PATIENT GOALS: to be able to perform stairs   OBJECTIVE:   DIAGNOSTIC FINDINGS:  Lumbar MRI in 2023: -Alignment: Mild retrolisthesis L1-2. Mild anterolisthesis L3-4. Mild retrolisthesis L4-5. IMPRESSION: -Motion degraded study  -Negative for fracture or metastatic disease  -Lumbar spondylosis. Multilevel spinal and foraminal stenosis  X ray of R hip in 2023:  IMPRESSION: Mild osteoarthritis of the right hip.  X rays of L knee in 2023: IMPRESSION: Mild-to-moderate medial and patellofemoral osteoarthritis.  Chronic spurring at the patellar tendon insertion on the tibial tubercle.   TODAY'S TREATMENT:                                                                                                                                 FUNCTIONAL TESTS:  5x STS test:  Initial / Current:  28.9 without UE support / 19.47 sec without UE support     Nustep L4 bilat Ues/LE's x 5 mins    Staggered STS 2x10 (R behind)  Leg extension machine 30# 2x10 (cues for >R leg)  Hip abduction machine 85# 3x10  HSC machine 50# 3x10 (cues for >RLE)  Leg press (black) 40# 3x10  Hip hinging at wall x10  Walking marches at rail x2 laps  Side stepping with GTB at  ankles x2 laps        Stair climbing:  full stair case:  ascending with reciprocal gait with single rail and descending with reciprocal pattern with bil rails for first 1/2, single rail for 2nd half   PATIENT EDUCATION:  Education details: PT answered pt's questions.  PT educated pt concerning dx, POC, rationale of interventions, and relevant anatomy.  Person educated: Patient Education method: Explanation Education comprehension: verbalized understanding  HOME EXERCISE PROGRAM: Access Code: [email or web address] URL: [email or web address] Date: 08/11/2022 Prepared by: Riki Altes  Exercises - Hooklying Hamstring Stretch with Strap  - 1 x daily - 7 x weekly - 3 sets - 30 seconds hold - Supine Active Straight Leg Raise  - 1 x daily - 7 x weekly - 3 sets - 10 reps - Sidelying Hip Abduction  - 1 x daily - 7 x weekly - 3 sets - 10 reps - Sit to Stand Without Arm Support  -  1 x daily - 7 x weekly - 3 sets - 10 reps  ASSESSMENT:  CLINICAL IMPRESSION: Pt came to clinic thinking he had an apt today and we were able to work him in. Pt was able to ascend full flight of stairs in clinic with reciprocal pattern and single rail use. Required bilateral rail use with descent due to subjective report of lack of confidence, but was able to use single rail for last 1/2 of staircase. SBA utilized throughout this. He continues to demonstrate lack of WB thorough RLE in standing positions. Trialled hip hinging which e had good form with, though obvious weight shift to L LE. No complaints with machine strengthening and he was able to increase repetitions with good tolerance.   OBJECTIVE IMPAIRMENTS: Abnormal gait, decreased activity tolerance, decreased endurance, decreased mobility, difficulty walking, decreased strength, and pain.   ACTIVITY LIMITATIONS: sitting, stairs, and locomotion level  PARTICIPATION LIMITATIONS: shopping and community activity  PERSONAL FACTORS: 3+ comorbidities: CLL, DM  Type 2, arthritis, and peripheral neuropathy. Pt reports having balance deficits  are also affecting patient's functional outcome.   REHAB POTENTIAL: Good  CLINICAL DECISION MAKING: Stable/uncomplicated  EVALUATION COMPLEXITY: Low   GOALS:  SHORT TERM GOALS: Target date: 08/27/2022   Pt will be independent and compliant with HEP for improved pain, strength, ROM, and function.   Baseline: Goal status: MET 7/22  2.  Pt will report at least a 25% improvement in his mobility overall.  Baseline:  Goal status: INITIAL  3.  Pt will be able to perform a 6 inch step up with good form and good control. Baseline:  Goal status: IN PROGRESS 7/29 Target date:  09/03/2022  4.  Pt will report improved balance with his daily mobility and daily activities.  Baseline:  Goal status: INITIAL Target date:  09/03/2022    LONG TERM GOALS: Target date: 10/01/2022  Pt will demo improved 5x STS test to no > 16 sec for improved functional LE strength and improved performance of transfers.  Baseline:  Goal status: Progressing--7/24  2.  Pt will demo improved R hip flex and knee flexion (seated) strength to 5/5 MMT and bilat hip abd strength by at least 5#'s (HHD) for improved tolerance with and performance of functional mobility.   Baseline:  Goal status: INITIAL  3.  Pt will report he is able to perform extended community ambulation distance without significant difficulty and without significant pain.   Baseline:  Goal status: INITIAL  4.  Pt will be able to perform stairs with a reciprocal gait with rail without significant difficulty and with good control.  Baseline:  Goal status: IN PROGRESS 7/29    PLAN:  PT FREQUENCY: 2x/week  PT DURATION: 6-8 weeks though pt may be out of town at times during the cert period  PLANNED INTERVENTIONS: Therapeutic exercises, Therapeutic activity, Neuromuscular re-education, Balance training, Gait training, Patient/Family education, Self Care, Joint  mobilization, Stair training, DME instructions, Aquatic Therapy, Cryotherapy, scar mobilization, Taping, Manual therapy, and Re-evaluation.  PLAN FOR NEXT SESSION: Cont with core and LE strengthening.  Balance exercises including standing with FT and airex exercises.  Establish HEP.  Supine TrA contractions, supine marching with TrA, supine SLR, S/L hip abd, mini squats, step ups on 4 inch step, and S/L clams.      Riki Altes, PTA  09/01/22 2:39 PM

## 2022-09-04 ENCOUNTER — Encounter (HOSPITAL_BASED_OUTPATIENT_CLINIC_OR_DEPARTMENT_OTHER): Payer: Medicare Other | Admitting: Physical Therapy

## 2022-09-05 ENCOUNTER — Telehealth: Payer: Self-pay

## 2022-09-05 NOTE — Telephone Encounter (Signed)
Emeline Darling, RN  Emeline Darling, RN Personal reminder placed on 03/05/2022  Reminder for ultrasound again in 6 months:

## 2022-09-05 NOTE — Telephone Encounter (Signed)
Per OV note from 06/18/22, Dr. Leone Payor recommended liver imaging in 1 year. New staff reminder sent to myself & Viviann Spare, RN to order and schedule at that time.

## 2022-09-08 ENCOUNTER — Encounter (HOSPITAL_BASED_OUTPATIENT_CLINIC_OR_DEPARTMENT_OTHER): Payer: Self-pay | Admitting: Physical Therapy

## 2022-09-08 ENCOUNTER — Ambulatory Visit (HOSPITAL_BASED_OUTPATIENT_CLINIC_OR_DEPARTMENT_OTHER): Payer: Medicare Other | Attending: Neurosurgery | Admitting: Physical Therapy

## 2022-09-08 DIAGNOSIS — M5416 Radiculopathy, lumbar region: Secondary | ICD-10-CM | POA: Insufficient documentation

## 2022-09-08 DIAGNOSIS — R262 Difficulty in walking, not elsewhere classified: Secondary | ICD-10-CM | POA: Diagnosis not present

## 2022-09-08 DIAGNOSIS — M6281 Muscle weakness (generalized): Secondary | ICD-10-CM | POA: Insufficient documentation

## 2022-09-08 NOTE — Therapy (Signed)
OUTPATIENT PHYSICAL THERAPY THORACOLUMBAR TREATMENT   Patient Name: Patrick Kidd MRN: [id number] DOB:01-Feb-1946, 77 y.o., male Today's Date: 09/09/2022  END OF SESSION:  PT End of Session - 09/08/22 1457     Visit Number 8    Number of Visits 12    Date for PT Re-Evaluation 10/01/22    Authorization Type MCR A & B    PT Start Time 1454    PT Stop Time 1536    PT Time Calculation (min) 42 min    Activity Tolerance Patient tolerated treatment well    Behavior During Therapy WFL for tasks assessed/performed                 Past Medical History:  Diagnosis Date   Acrophobia    Alcoholic cirrhosis (HCC) 10/04/2015   Anemia    Arthritis    foot by big toe   BPH associated with nocturia    Cataract    removed both eyes   Cholecystitis 05/2022   tx Abx   Chronic cough    PMH of   CLL (chronic lymphocytic leukemia) (HCC)    COVID-19 12/2018   Diabetes mellitus without complication (HCC)    Diverticulosis 07/03/2010   Colonoscopy.    Duodenal ulcer 2017   Fallen arches    Bilateral   GERD (gastroesophageal reflux disease)    Gout    Granuloma annulare    Hx of adenomatous colonic polyps multiple   Hydrocele 2011   Large septated right hydrocele   Liver cyst    Liver lesion    Nonspecific elevation of levels of transaminase or lactic acid dehydrogenase (LDH)    Obesity    Peripheral neuropathy    Plantar fasciitis    PMH of   Portal hypertension (HCC) 2017   Prostate cancer (HCC)    Right shoulder pain 11/2017   Sleep apnea    no cpap, patient denies   Thiamine deficiency    Wears reading eyeglasses    WPW (Wolff-Parkinson-White syndrome) 02/05/2019   Past Surgical History:  Procedure Laterality Date   BACK SURGERY  04/28/2022   CATARACT EXTRACTION, BILATERAL  12/2011   Dr Nile Riggs   COLONOSCOPY  2017   COLONOSCOPY W/ POLYPECTOMY  07/03/2010   2 adenomas, diverticulosis on right. Dr Leone Payor   CYSTOSCOPY N/A 03/15/2018   Procedure: Derinda Late;  Surgeon: Crista Elliot, MD;  Location: [facility];  Service: Urology;  Laterality: N/A;  NO SEEDS FOUND IN BLADDER   ESOPHAGOGASTRODUODENOSCOPY (EGD) WITH PROPOFOL N/A 01/08/2016   Procedure: ESOPHAGOGASTRODUODENOSCOPY (EGD) WITH PROPOFOL;  Surgeon: Meryl Dare, MD;  Location: [facility];  Service: Endoscopy;  Laterality: N/A;   FLEXIBLE SIGMOIDOSCOPY  2000   RADIOACTIVE SEED IMPLANT N/A 03/15/2018   Procedure: RADIOACTIVE SEED IMPLANT/BRACHYTHERAPY IMPLANT;  Surgeon: Crista Elliot, MD;  Location: [facility];  Service: Urology;  Laterality: N/A;   73 SEEDS IMPLANTED   SIGMOIDOSCOPY     SPACE OAR INSTILLATION N/A 03/15/2018   Procedure: SPACE OAR INSTILLATION;  Surgeon: Crista Elliot, MD;  Location: [facility];  Service: Urology;  Laterality: N/A;   WISDOM TOOTH EXTRACTION     Patient Active Problem List   Diagnosis Date Noted   E coli bacteremia 06/01/2022   Sludge in gallbladder 05/30/2022   Hyperbilirubinemia 05/30/2022   Urinary incontinence 06/25/2021   Degenerative lumbar spinal stenosis 04/30/2021   Degenerative arthritis of knee, bilateral 03/27/2021   Chronic  lymphocytic leukemia (HCC) 11/08/2020   Insulin-requiring or dependent type II diabetes mellitus (HCC) 09/25/2020   Stage 3a chronic kidney disease (HCC) 03/15/2020   Thiamine deficiency    Paroxysmal atrial fibrillation (HCC) 07/19/2019   Allergic rhinitis 06/09/2019   Erectile dysfunction due to diabetes mellitus (HCC) 06/09/2019   Overweight with body mass index (BMI) of 28 to 28.9 in adult 03/17/2019   WPW (Wolff-Parkinson-White syndrome) 02/05/2019   SBP (spontaneous bacterial peritonitis) (HCC)-December 2020,     Encephalopathy, hepatic (HCC)    Degenerative disc disease, cervical 07/21/2018   Cervical radiculopathy 07/16/2018   OAB (overactive bladder) 07/06/2018   Malignant neoplasm of prostate (HCC) 01/13/2018   Hyperlipidemia LDL  goal <100 11/11/2017   Essential hypertension 11/11/2017   BPH associated with nocturia 11/11/2017   Erectile dysfunction due to arterial insufficiency 04/07/2017   Peripheral vascular disease (HCC) 06/25/2016   Alcoholic cirrhosis (HCC) 10/04/2015   Type 2 diabetes mellitus with complication, with long-term current use of insulin (HCC) 08/22/2015   Hypersomnolence 03/19/2014   Peripheral neuropathy 01/11/2013   Polyclonal gammopathy 01/11/2013   GERD 10/04/2009     REFERRING PROVIDER: Julio Sicks, MD   REFERRING DIAG: M54.16 (ICD-10-CM) - Radiculopathy, lumbar region  Rationale for Evaluation and Treatment: Rehabilitation  THERAPY DIAG:  Muscle weakness (generalized)  Difficulty in walking, not elsewhere classified  Radiculopathy, lumbar region  ONSET DATE: DOS 04/28/2022  SUBJECTIVE:                                                                                                                                                                                           SUBJECTIVE STATEMENT:  Pt states he had a really good session last Rx and had no adverse effects after prior Rx.  Pt states he is still limping.       PERTINENT HISTORY:  Lumbar decompression surgery on 04/27/2022 Lumbar MRI in 2023 showed mild retrolisthesis L1-2, mild anterolisthesis L3-4. And mild retrolisthesis L4-5. DM type 2 and Peripheral neuropathy CLL (chronic lymphocytic leukemia) Arthritis (X ray findings of OA in R hip and L knee) Prostate CA with radiation Alcoholic Cirrhosis WPW (Wolff-Parkinson-White syndrome), Pt states it doesn't affect him  PAIN:  Are you having pain? Yes NPRS:  2/10 current / 0/10 Location: L knee / lumbar  PRECAUTIONS: Other: per surgery ; mild retrolisthesis and anterolisthesis ; CLL and peripheral neuropathy ; Pt reports having balance deficits  WEIGHT BEARING RESTRICTIONS: none indicated on MD note and pt reports none also  FALLS:  Has patient fallen in last  6 months? No  LIVING ENVIRONMENT: Lives with: lives with their spouse Lives in: 1 story  home Stairs: 1 step with 1 rail Has following equipment at home: cane  OCCUPATION: part time work.  Driving.  Pt does have to ambulate on uneven terrain.    PLOF: Independent.  Pt was able to ambulate 1.5 miles prior to increased lumbar and LE pain.  PATIENT GOALS: to be able to perform stairs   OBJECTIVE:   DIAGNOSTIC FINDINGS:  Lumbar MRI in 2023: -Alignment: Mild retrolisthesis L1-2. Mild anterolisthesis L3-4. Mild retrolisthesis L4-5. IMPRESSION: -Motion degraded study  -Negative for fracture or metastatic disease  -Lumbar spondylosis. Multilevel spinal and foraminal stenosis  X ray of R hip in 2023:  IMPRESSION: Mild osteoarthritis of the right hip.  X rays of L knee in 2023: IMPRESSION: Mild-to-moderate medial and patellofemoral osteoarthritis.  Chronic spurring at the patellar tendon insertion on the tibial tubercle.   TODAY'S TREATMENT:                                                                                                                                 FUNCTIONAL TESTS:     Nustep L4 bilat Ues/LE's x 4 mins  Leg extension machine 30# 2x10   Hip abduction machine 85# 3x10  HSC machine 50# 3x10  Leg press (black) 40# 3x10  Side stepping with GTB at ankles 3x10 at wall  S/L clams with TrA   PT educated pt with correct performance and palpation of TrA contraction in supine     Walking marches at rail x2 laps  Stair climbing:  full stair case:  ascending with reciprocal gait with single rail and descending with step through pattern with bil rails for first 1/2, single rail for 2nd half   PATIENT EDUCATION:  Education details:  POC, rationale of interventions, exercise form, and relevant anatomy.  Person educated: Patient Education method: Explanation Education comprehension: verbalized understanding  HOME EXERCISE PROGRAM: Access Code: [email or web address] URL:  [email or web address] Date: 08/11/2022 Prepared by: Riki Altes  Exercises - Hooklying Hamstring Stretch with Strap  - 1 x daily - 7 x weekly - 3 sets - 30 seconds hold - Supine Active Straight Leg Raise  - 1 x daily - 7 x weekly - 3 sets - 10 reps - Sidelying Hip Abduction  - 1 x daily - 7 x weekly - 3 sets - 10 reps - Sit to Stand Without Arm Support  - 1 x daily - 7 x weekly - 3 sets - 10 reps  ASSESSMENT:  CLINICAL IMPRESSION: Pt is progressing well with LE strength as evidenced by performance and progression of exercises.  PT educated pt with performing TrA contractions to improve core strength and he required cuing and instruction to perform correctly.  Pt also required cuing for correct form and positioning with S/L clams.  Pt continues to have difficulty with descending stairs, but ascends steps with a reciprocal pattern with rail.  He responded well to Rx reporting no change in pain after Rx.  OBJECTIVE IMPAIRMENTS:  Abnormal gait, decreased activity tolerance, decreased endurance, decreased mobility, difficulty walking, decreased strength, and pain.   ACTIVITY LIMITATIONS: sitting, stairs, and locomotion level  PARTICIPATION LIMITATIONS: shopping and community activity  PERSONAL FACTORS: 3+ comorbidities: CLL, DM Type 2, arthritis, and peripheral neuropathy. Pt reports having balance deficits  are also affecting patient's functional outcome.   REHAB POTENTIAL: Good  CLINICAL DECISION MAKING: Stable/uncomplicated  EVALUATION COMPLEXITY: Low   GOALS:  SHORT TERM GOALS: Target date: 08/27/2022   Pt will be independent and compliant with HEP for improved pain, strength, ROM, and function.   Baseline: Goal status: MET 7/22  2.  Pt will report at least a 25% improvement in his mobility overall.  Baseline:  Goal status: INITIAL  3.  Pt will be able to perform a 6 inch step up with good form and good control. Baseline:  Goal status: IN PROGRESS  7/29 Target date:  09/03/2022  4.  Pt will report improved balance with his daily mobility and daily activities.  Baseline:  Goal status: INITIAL Target date:  09/03/2022    LONG TERM GOALS: Target date: 10/01/2022  Pt will demo improved 5x STS test to no > 16 sec for improved functional LE strength and improved performance of transfers.  Baseline:  Goal status: Progressing--7/24  2.  Pt will demo improved R hip flex and knee flexion (seated) strength to 5/5 MMT and bilat hip abd strength by at least 5#'s (HHD) for improved tolerance with and performance of functional mobility.   Baseline:  Goal status: INITIAL  3.  Pt will report he is able to perform extended community ambulation distance without significant difficulty and without significant pain.   Baseline:  Goal status: INITIAL  4.  Pt will be able to perform stairs with a reciprocal gait with rail without significant difficulty and with good control.  Baseline:  Goal status: IN PROGRESS 7/29    PLAN:  PT FREQUENCY: 2x/week  PT DURATION: 6-8 weeks though pt may be out of town at times during the cert period  PLANNED INTERVENTIONS: Therapeutic exercises, Therapeutic activity, Neuromuscular re-education, Balance training, Gait training, Patient/Family education, Self Care, Joint mobilization, Stair training, DME instructions, Aquatic Therapy, Cryotherapy, scar mobilization, Taping, Manual therapy, and Re-evaluation.  PLAN FOR NEXT SESSION: Cont with core and LE strengthening.  Balance exercises including standing with FT and airex exercises.  Review and perform supine TrA contractions.      Audie Clear III PT, DPT 09/09/22 12:47 PM

## 2022-09-09 ENCOUNTER — Encounter (HOSPITAL_BASED_OUTPATIENT_CLINIC_OR_DEPARTMENT_OTHER): Payer: Medicare Other | Admitting: Physical Therapy

## 2022-09-11 ENCOUNTER — Encounter (HOSPITAL_BASED_OUTPATIENT_CLINIC_OR_DEPARTMENT_OTHER): Payer: Medicare Other | Admitting: Physical Therapy

## 2022-09-15 ENCOUNTER — Encounter (HOSPITAL_BASED_OUTPATIENT_CLINIC_OR_DEPARTMENT_OTHER): Payer: Medicare Other | Admitting: Physical Therapy

## 2022-09-15 NOTE — Progress Notes (Unsigned)
NEUROLOGY FOLLOW UP OFFICE NOTE  [name] [id number]  Assessment/Plan:   Diabetic polyneuropathy Muscle spasms involving buttock   gabapentin 300mg  in AM and 600mg  at bedtime For muscle spasms, cyclobenzaprine 10mg  three times daily as needed Follow up one year or as needed.   Subjective:  Patrick Kidd is a 77 y.o. right-handed male with DM 2 and lumbar spinal stenosis with radiculopathy who follows up for diabetic polyneuropathy.    UPDATE: Taking:  gabapentin 300mg  in AM and 600mg  at bedtime; cyclobenzaprine 10mg  TID PRN  ***   HISTORY: Onset of symptoms began in 2010.  He describes initially experiencing numbness on the bottom of both feet, in which recently it has spread to the ankles. It is also associated with an uncomfortable tingling sensation. It is not painful but it is uncomfortable and usually bothers him at night.  He doesn't really notice it if he is active.  He is concerned about his balance.  The first time, he was getting out of a boat and felt that his right leg was not strong enough to hold his weight and he fell to his knees. The second time, he tripped over a chair and fell down to his knees.  He felt that his leg just gave out when it paired his weight. He is particularly concerned, because he does enjoy riding on a boat and dancing.  More recently, he stood up from a chair at a game and felt shaky and unsteady.  He has not been exercising as much due to fear of imbalance.  He denies any pain radiating down the legs.  He has pain in his right big toe after an 8 pound frozen duck breast fell on it.  He has tingling in the finger tips.   Labs from 08/04/12 revealed ESR 29, ANA negative, Lyme negative.  SPEP revealed possible faint restricted band in the gamma region, IFE revealed polyclonal gammopathy involving IgA and IgM.  Prior labs last month revealed Hgb A1c 5.7, TSH 2.42, RPR non-reactive, serum glucose 131.  CBC and CMP otherwise  unremarkable.  B12 from 5/12 was 1293.  Fasting glucose was 143 and 2 hour glucose tolerance test was 341.  However, he was diagnosed with type 2 diabetes after Hgb A1c from 01/09/16 was found to be 8.4.  PAST MEDICAL HISTORY: Past Medical History:  Diagnosis Date   Acrophobia    Alcoholic cirrhosis (HCC) 10/04/2015   Anemia    Arthritis    foot by big toe   BPH associated with nocturia    Cataract    removed both eyes   Cholecystitis 05/2022   tx Abx   Chronic cough    PMH of   CLL (chronic lymphocytic leukemia) (HCC)    COVID-19 12/2018   Diabetes mellitus without complication (HCC)    Diverticulosis 07/03/2010   Colonoscopy.    Duodenal ulcer 2017   Fallen arches    Bilateral   GERD (gastroesophageal reflux disease)    Gout    Granuloma annulare    Hx of adenomatous colonic polyps multiple   Hydrocele 2011   Large septated right hydrocele   Liver cyst    Liver lesion    Nonspecific elevation of levels of transaminase or lactic acid dehydrogenase (LDH)    Obesity    Peripheral neuropathy    Plantar fasciitis    PMH of   Portal hypertension (HCC) 2017   Prostate cancer (HCC)    Right shoulder pain 11/2017  Sleep apnea    no cpap, patient denies   Thiamine deficiency    Wears reading eyeglasses    WPW (Wolff-Parkinson-White syndrome) 02/05/2019    MEDICATIONS: Current Outpatient Medications on File Prior to Visit  Medication Sig Dispense Refill   acetaminophen (TYLENOL) 325 MG tablet Take 650 mg by mouth daily as needed for moderate pain.     Continuous Blood Gluc Receiver (FREESTYLE LIBRE 3 READER) DEVI 1 Act by Does not apply route daily. 1 each 3   Continuous Blood Gluc Sensor (FREESTYLE LIBRE 3 SENSOR) MISC 1 Act by Does not apply route daily. Place 1 sensor on the skin every 14 days. Use to check glucose continuously 2 each 5   gabapentin (NEURONTIN) 300 MG capsule Take 1 capsule (300 mg total) by mouth 3 (three) times daily. (Patient taking differently:  Take 300 mg by mouth 2 (two) times daily.) 270 capsule 1   insulin glargine, 2 Unit Dial, (TOUJEO MAX SOLOSTAR) 300 UNIT/ML Solostar Pen Inject 50 Units into the skin daily. Via SANOFI pt assistance 15 mL 1   Insulin Pen Needle (PEN NEEDLES) 32G X 5 MM MISC Inject 1 Act into the skin daily. Use to administer insulin. Dx E11.9 Dispense based on insurance preference. 100 each 1   Lancets (ACCU-CHEK MULTICLIX) lancets Use to check sugar daily. DX: E11.8 100 each 12   metFORMIN (GLUCOPHAGE) 500 MG tablet TAKE 1 TABLET BY MOUTH 2 TIMES DAILY WITH A MEAL. 180 tablet 0   pantoprazole (PROTONIX) 40 MG tablet TAKE 1 TABLET 30 MINUTES BEFORE MEALS (BREAKFAST AND SUPPER) TWICE A DAY (Patient taking differently: Take 40 mg by mouth 2 (two) times daily. TAKE 1 TABLET 30 MINUTES BEFORE MEALS (BREAKFAST AND SUPPER) TWICE A DAY) 180 tablet 0   No current facility-administered medications on file prior to visit.    ALLERGIES: Allergies  Allergen Reactions   Rifaximin Nausea And Vomiting    Required EMS visit, although patient did not go to hospital   Beta Adrenergic Blockers     Likely WPW.  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  Use with caution.    FAMILY HISTORY: Family History  Problem Relation Age of Onset   Lung cancer Mother        smoker   Leukemia Father        Acute myelocytic   Diabetes Neg Hx    Stroke Neg Hx    Heart disease Neg Hx    Colon cancer Neg Hx    Colon polyps Neg Hx    Esophageal cancer Neg Hx    Rectal cancer Neg Hx    Stomach cancer Neg Hx       Objective:  *** General: No acute distress.  Patient appears well-groomed.   Head:  Normocephalic/atraumatic Eyes:  Fundi examined but not visualized Neck: supple, no paraspinal tenderness, full range of motion Heart:  Regular rate and rhythm Neurological Exam: ***   Shon Millet, DO  CC: Sanda Linger, MD

## 2022-09-16 ENCOUNTER — Ambulatory Visit (INDEPENDENT_AMBULATORY_CARE_PROVIDER_SITE_OTHER): Payer: Medicare Other | Admitting: Neurology

## 2022-09-16 ENCOUNTER — Encounter: Payer: Self-pay | Admitting: Neurology

## 2022-09-16 VITALS — BP 138/76 | HR 100 | Ht 75.0 in | Wt 227.6 lb

## 2022-09-16 DIAGNOSIS — G6289 Other specified polyneuropathies: Secondary | ICD-10-CM | POA: Diagnosis not present

## 2022-09-16 DIAGNOSIS — E1142 Type 2 diabetes mellitus with diabetic polyneuropathy: Secondary | ICD-10-CM | POA: Diagnosis not present

## 2022-09-16 DIAGNOSIS — M5416 Radiculopathy, lumbar region: Secondary | ICD-10-CM | POA: Diagnosis not present

## 2022-09-16 MED ORDER — GABAPENTIN 300 MG PO CAPS: 300.0000 mg | ORAL_CAPSULE | Freq: Three times a day (TID) | ORAL | 3 refills | Status: DC

## 2022-09-16 NOTE — Patient Instructions (Signed)
Take gabapentin 300mg  in morning and 600mg  at bedtime

## 2022-09-18 ENCOUNTER — Ambulatory Visit (HOSPITAL_BASED_OUTPATIENT_CLINIC_OR_DEPARTMENT_OTHER): Payer: Medicare Other | Admitting: Physical Therapy

## 2022-09-18 DIAGNOSIS — R262 Difficulty in walking, not elsewhere classified: Secondary | ICD-10-CM | POA: Diagnosis not present

## 2022-09-18 DIAGNOSIS — M6281 Muscle weakness (generalized): Secondary | ICD-10-CM | POA: Diagnosis not present

## 2022-09-18 DIAGNOSIS — M5416 Radiculopathy, lumbar region: Secondary | ICD-10-CM

## 2022-09-18 NOTE — Therapy (Signed)
OUTPATIENT PHYSICAL THERAPY THORACOLUMBAR TREATMENT   Patient Name: Patrick Kidd MRN: [id number] DOB:[birth year], 77 y.o., male Today's Date: 09/19/2022  END OF SESSION:  PT End of Session - 09/18/22 1539     Visit Number 9    Number of Visits 12    Date for PT Re-Evaluation 10/01/22    Authorization Type MCR A & B    PT Start Time 1454    PT Stop Time 1534    PT Time Calculation (min) 40 min    Activity Tolerance Patient tolerated treatment well    Behavior During Therapy WFL for tasks assessed/performed                  Past Medical History:  Diagnosis Date   Acrophobia    Alcoholic cirrhosis (HCC) 10/04/2015   Anemia    Arthritis    foot by big toe   BPH associated with nocturia    Cataract    removed both eyes   Cholecystitis 05/2022   tx Abx   Chronic cough    PMH of   CLL (chronic lymphocytic leukemia) (HCC)    COVID-19 12/2018   Diabetes mellitus without complication (HCC)    Diverticulosis 07/03/2010   Colonoscopy.    Duodenal ulcer 2017   Fallen arches    Bilateral   GERD (gastroesophageal reflux disease)    Gout    Granuloma annulare    Hx of adenomatous colonic polyps multiple   Hydrocele 2011   Large septated right hydrocele   Liver cyst    Liver lesion    Nonspecific elevation of levels of transaminase or lactic acid dehydrogenase (LDH)    Obesity    Peripheral neuropathy    Plantar fasciitis    PMH of   Portal hypertension (HCC) 2017   Prostate cancer (HCC)    Right shoulder pain 11/2017   Sleep apnea    no cpap, patient denies   Thiamine deficiency    Wears reading eyeglasses    WPW (Wolff-Parkinson-White syndrome) 02/05/2019   Past Surgical History:  Procedure Laterality Date   BACK SURGERY  04/28/2022   CATARACT EXTRACTION, BILATERAL  12/2011   Dr Nile Riggs   COLONOSCOPY  2017   COLONOSCOPY W/ POLYPECTOMY  07/03/2010   2 adenomas, diverticulosis on right. Dr Leone Payor   CYSTOSCOPY N/A 03/15/2018   Procedure:  Derinda Late;  Surgeon: Crista Elliot, MD;  Location: [facility];  Service: Urology;  Laterality: N/A;  NO SEEDS FOUND IN BLADDER   ESOPHAGOGASTRODUODENOSCOPY (EGD) WITH PROPOFOL N/A 01/08/2016   Procedure: ESOPHAGOGASTRODUODENOSCOPY (EGD) WITH PROPOFOL;  Surgeon: Meryl Dare, MD;  Location: [facility];  Service: Endoscopy;  Laterality: N/A;   FLEXIBLE SIGMOIDOSCOPY  2000   RADIOACTIVE SEED IMPLANT N/A 03/15/2018   Procedure: RADIOACTIVE SEED IMPLANT/BRACHYTHERAPY IMPLANT;  Surgeon: Crista Elliot, MD;  Location: [facility] [place];  Service: Urology;  Laterality: N/A;   73 SEEDS IMPLANTED   SIGMOIDOSCOPY     SPACE OAR INSTILLATION N/A 03/15/2018   Procedure: SPACE OAR INSTILLATION;  Surgeon: Crista Elliot, MD;  Location: [facility];  Service: Urology;  Laterality: N/A;   WISDOM TOOTH EXTRACTION     Patient Active Problem List   Diagnosis Date Noted   E coli bacteremia 06/01/2022   Sludge in gallbladder 05/30/2022   Hyperbilirubinemia 05/30/2022   Urinary incontinence 06/25/2021   Degenerative lumbar spinal stenosis 04/30/2021   Degenerative arthritis of knee, bilateral 03/27/2021  Chronic lymphocytic leukemia (HCC) 11/08/2020   Insulin-requiring or dependent type II diabetes mellitus (HCC) 09/25/2020   Stage 3a chronic kidney disease (HCC) 03/15/2020   Thiamine deficiency    Paroxysmal atrial fibrillation (HCC) 07/19/2019   Allergic rhinitis 06/09/2019   Erectile dysfunction due to diabetes mellitus (HCC) 06/09/2019   Overweight with body mass index (BMI) of 28 to 28.9 in adult 03/17/2019   WPW (Wolff-Parkinson-White syndrome) 02/05/2019   SBP (spontaneous bacterial peritonitis) (HCC)-December 2020,     Encephalopathy, hepatic (HCC)    Degenerative disc disease, cervical 07/21/2018   Cervical radiculopathy 07/16/2018   OAB (overactive bladder) 07/06/2018   Malignant neoplasm of prostate (HCC) 01/13/2018    Hyperlipidemia LDL goal <100 11/11/2017   Essential hypertension 11/11/2017   BPH associated with nocturia 11/11/2017   Erectile dysfunction due to arterial insufficiency 04/07/2017   Peripheral vascular disease (HCC) 06/25/2016   Alcoholic cirrhosis (HCC) 10/04/2015   Type 2 diabetes mellitus with complication, with long-term current use of insulin (HCC) 08/22/2015   Hypersomnolence 03/19/2014   Peripheral neuropathy 01/11/2013   Polyclonal gammopathy 01/11/2013   GERD 10/04/2009     REFERRING PROVIDER: Julio Sicks, MD   REFERRING DIAG: M54.16 (ICD-10-CM) - Radiculopathy, lumbar region  Rationale for Evaluation and Treatment: Rehabilitation  THERAPY DIAG:  Muscle weakness (generalized)  Difficulty in walking, not elsewhere classified  Radiculopathy, lumbar region  ONSET DATE: DOS 04/28/2022  SUBJECTIVE:                                                                                                                                                                                           SUBJECTIVE STATEMENT:  Pt states he doesn't feel that he is progressing currently.  Pt states his back is not bothering him, but his knees are.  Pt thinks he has a meniscus issue in his knee currently.  Pt states he is still limping though it's due to his knees.  He has had a couple of occasions of pain in his lumbar from lifting objects.  It was immediate pain, but didn't last.  Pt has balance and stability deficits.  Pt reports he is improving with lateral hip strength.  Pt reports 30% improvement in mobility overall, but wants to be 90%.   Pt denies any adverse effects after prior Rx.         PERTINENT HISTORY:  Lumbar decompression surgery on 04/27/2022 Lumbar MRI in 2023 showed mild retrolisthesis L1-2, mild anterolisthesis L3-4. And mild retrolisthesis L4-5. DM type 2 and Peripheral neuropathy CLL (chronic lymphocytic leukemia) Arthritis (X ray findings of OA in R hip and L  knee) Prostate CA with  radiation Alcoholic Cirrhosis WPW (Wolff-Parkinson-White syndrome), Pt states it doesn't affect him  PAIN:  Are you having pain? Yes NPRS:  2/10 current / 0/10 Location: bilat knees / lumbar 4/10 bilat knee pain with standing up  PRECAUTIONS: Other: per surgery ; mild retrolisthesis and anterolisthesis ; CLL and peripheral neuropathy ; Pt reports having balance deficits  WEIGHT BEARING RESTRICTIONS: none indicated on MD note and pt reports none also  FALLS:  Has patient fallen in last 6 months? No  LIVING ENVIRONMENT: Lives with: lives with their spouse Lives in: 1 story home Stairs: 1 step with 1 rail Has following equipment at home: cane  OCCUPATION: part time work.  Driving.  Pt does have to ambulate on uneven terrain.    PLOF: Independent.  Pt was able to ambulate 1.5 miles prior to increased lumbar and LE pain.  PATIENT GOALS: to be able to perform stairs   OBJECTIVE:   DIAGNOSTIC FINDINGS:  Lumbar MRI in 2023: -Alignment: Mild retrolisthesis L1-2. Mild anterolisthesis L3-4. Mild retrolisthesis L4-5. IMPRESSION: -Motion degraded study  -Negative for fracture or metastatic disease  -Lumbar spondylosis. Multilevel spinal and foraminal stenosis  X ray of R hip in 2023:  IMPRESSION: Mild osteoarthritis of the right hip.  X rays of L knee in 2023: IMPRESSION: Mild-to-moderate medial and patellofemoral osteoarthritis.  Chronic spurring at the patellar tendon insertion on the tibial tubercle.   TODAY'S TREATMENT:                                                                                                                                   Pt has decreased control with 6 inch step ups and requires cuing for correct form.     PT spent time educating pt in correct performance and palpation of TrA contraction.  Pt performed supine TrA contractions.   Supine SLR with TrA x10 each  S/L clams with TrA 2x10 bilat  Side stepping with GTB at  ankles 3x10 at wall     Walking marches at rail x2 laps  Standing on airex:  FA x 30 sec, FT 2x30 sec, marches x 10 without UE support with min assist and rail for 1 LOB   Reviewed HEP.  Updated HEP and gave pt a HEP handout.  PT educated pt in correct form and appropriate frequency.    PATIENT EDUCATION:  Education details:  POC, rationale of interventions, exercise form, HEP, and relevant anatomy.  Person educated: Patient Education method: Explanation, demonstration, verbal cues, handout Education comprehension: verbalized understanding, returned demonstration, verbal cues required  HOME EXERCISE PROGRAM: Access Code: [email or web address] URL: [email or web address] Date: 08/11/2022 Prepared by: Riki Altes  Exercises - Hooklying Hamstring Stretch with Strap  - 1 x daily - 7 x weekly - 3 sets - 30 seconds hold - Supine Active Straight Leg Raise  - 1 x daily - 7 x weekly - 3 sets - 10 reps - Sidelying Hip Abduction  -  1 x daily - 7 x weekly - 3 sets - 10 reps - Sit to Stand Without Arm Support  - 1 x daily - 7 x weekly - 3 sets - 10 reps  Updated HEP: - Supine Transversus Abdominis Bracing - Hands on Stomach  - 2 x daily - 7 x weekly - 2 sets - 10 reps - Clamshell  - 1 x daily - 5-6 x weekly - 2 sets - 10 reps  ASSESSMENT:  CLINICAL IMPRESSION: Pt presents to Rx stating he doesn't feel that he is progressing currently.  Pt reports his knees are bothering and limiting him.  PT focused on core strengthening working on correct performance of TrA contraction.  Pt improved with TrA contraction with cuing and increased repetition.  Pt had improved form with S/L clam with cuing for form and positioning. PT reviewed HEP and updated HEP today.  Pt reports having difficulty with stairs.  He had decreased control and some difficulty with correct form with 6 inch step ups.  Pt stated his knee was a little sore after the airex exercises.  He responded well to Rx reporting no increased pain  after Rx.   OBJECTIVE IMPAIRMENTS: Abnormal gait, decreased activity tolerance, decreased endurance, decreased mobility, difficulty walking, decreased strength, and pain.   ACTIVITY LIMITATIONS: sitting, stairs, and locomotion level  PARTICIPATION LIMITATIONS: shopping and community activity  PERSONAL FACTORS: 3+ comorbidities: CLL, DM Type 2, arthritis, and peripheral neuropathy. Pt reports having balance deficits  are also affecting patient's functional outcome.   REHAB POTENTIAL: Good  CLINICAL DECISION MAKING: Stable/uncomplicated  EVALUATION COMPLEXITY: Low   GOALS:  SHORT TERM GOALS: Target date: 08/27/2022   Pt will be independent and compliant with HEP for improved pain, strength, ROM, and function.   Baseline: Goal status: MET 7/22  2.  Pt will report at least a 25% improvement in his mobility overall.  Baseline:  Goal status: GOAL MET  3.  Pt will be able to perform a 6 inch step up with good form and good control. Baseline:  Goal status: IN PROGRESS 7/29 Target date:  09/03/2022  4.  Pt will report improved balance with his daily mobility and daily activities.  Baseline:  Goal status: INITIAL Target date:  09/03/2022    LONG TERM GOALS: Target date: 10/01/2022  Pt will demo improved 5x STS test to no > 16 sec for improved functional LE strength and improved performance of transfers.  Baseline:  Goal status: Progressing--7/24  2.  Pt will demo improved R hip flex and knee flexion (seated) strength to 5/5 MMT and bilat hip abd strength by at least 5#'s (HHD) for improved tolerance with and performance of functional mobility.   Baseline:  Goal status: INITIAL  3.  Pt will report he is able to perform extended community ambulation distance without significant difficulty and without significant pain.   Baseline:  Goal status: INITIAL  4.  Pt will be able to perform stairs with a reciprocal gait with rail without significant difficulty and with good control.   Baseline:  Goal status: IN PROGRESS 7/29    PLAN:  PT FREQUENCY: 2x/week  PT DURATION: 6-8 weeks though pt may be out of town at times during the cert period  PLANNED INTERVENTIONS: Therapeutic exercises, Therapeutic activity, Neuromuscular re-education, Balance training, Gait training, Patient/Family education, Self Care, Joint mobilization, Stair training, DME instructions, Aquatic Therapy, Cryotherapy, scar mobilization, Taping, Manual therapy, and Re-evaluation.  PLAN FOR NEXT SESSION: Cont with core and LE strengthening.  Balance exercises including standing with FT and airex exercises.  Review and perform supine TrA contractions.      Audie Clear III PT, DPT 09/19/22 8:08 PM

## 2022-09-19 ENCOUNTER — Encounter (HOSPITAL_BASED_OUTPATIENT_CLINIC_OR_DEPARTMENT_OTHER): Payer: Self-pay | Admitting: Physical Therapy

## 2022-09-23 ENCOUNTER — Ambulatory Visit (HOSPITAL_BASED_OUTPATIENT_CLINIC_OR_DEPARTMENT_OTHER): Payer: Medicare Other

## 2022-09-23 ENCOUNTER — Encounter (HOSPITAL_BASED_OUTPATIENT_CLINIC_OR_DEPARTMENT_OTHER): Payer: Self-pay

## 2022-09-23 DIAGNOSIS — M5416 Radiculopathy, lumbar region: Secondary | ICD-10-CM | POA: Diagnosis not present

## 2022-09-23 DIAGNOSIS — M6281 Muscle weakness (generalized): Secondary | ICD-10-CM | POA: Diagnosis not present

## 2022-09-23 DIAGNOSIS — R262 Difficulty in walking, not elsewhere classified: Secondary | ICD-10-CM

## 2022-09-23 NOTE — Therapy (Signed)
OUTPATIENT PHYSICAL THERAPY THORACOLUMBAR TREATMENT   Patient Name: Patrick Kidd MRN: [id number] DOB:Jun 17, 1945, 77 y.o., male Today's Date: 09/23/2022  END OF SESSION:  PT End of Session - 09/23/22 1516     Visit Number 10    Number of Visits 12    Date for PT Re-Evaluation 10/01/22    Authorization Type MCR A & B    PT Start Time 1345    PT Stop Time 1430    PT Time Calculation (min) 45 min    Activity Tolerance Patient tolerated treatment well    Behavior During Therapy WFL for tasks assessed/performed                   Past Medical History:  Diagnosis Date   Acrophobia    Alcoholic cirrhosis (HCC) 10/04/2015   Anemia    Arthritis    foot by big toe   BPH associated with nocturia    Cataract    removed both eyes   Cholecystitis 05/2022   tx Abx   Chronic cough    PMH of   CLL (chronic lymphocytic leukemia) (HCC)    COVID-19 12/2018   Diabetes mellitus without complication (HCC)    Diverticulosis 07/03/2010   Colonoscopy.    Duodenal ulcer 2017   Fallen arches    Bilateral   GERD (gastroesophageal reflux disease)    Gout    Granuloma annulare    Hx of adenomatous colonic polyps multiple   Hydrocele 2011   Large septated right hydrocele   Liver cyst    Liver lesion    Nonspecific elevation of levels of transaminase or lactic acid dehydrogenase (LDH)    Obesity    Peripheral neuropathy    Plantar fasciitis    PMH of   Portal hypertension (HCC) 2017   Prostate cancer (HCC)    Right shoulder pain 11/2017   Sleep apnea    no cpap, patient denies   Thiamine deficiency    Wears reading eyeglasses    WPW (Wolff-Parkinson-White syndrome) 02/05/2019   Past Surgical History:  Procedure Laterality Date   BACK SURGERY  04/28/2022   CATARACT EXTRACTION, BILATERAL  12/2011   Dr Nile Riggs   COLONOSCOPY  2017   COLONOSCOPY W/ POLYPECTOMY  07/03/2010   2 adenomas, diverticulosis on right. Dr Leone Payor   CYSTOSCOPY N/A 03/15/2018   Procedure:  Derinda Late;  Surgeon: Crista Elliot, MD;  Location: [facility];  Service: Urology;  Laterality: N/A;  NO SEEDS FOUND IN BLADDER   ESOPHAGOGASTRODUODENOSCOPY (EGD) WITH PROPOFOL N/A 01/08/2016   Procedure: ESOPHAGOGASTRODUODENOSCOPY (EGD) WITH PROPOFOL;  Surgeon: Meryl Dare, MD;  Location: [facility];  Service: Endoscopy;  Laterality: N/A;   FLEXIBLE SIGMOIDOSCOPY  2000   RADIOACTIVE SEED IMPLANT N/A 03/15/2018   Procedure: RADIOACTIVE SEED IMPLANT/BRACHYTHERAPY IMPLANT;  Surgeon: Crista Elliot, MD;  Location: [facility];  Service: Urology;  Laterality: N/A;   73 SEEDS IMPLANTED   SIGMOIDOSCOPY     SPACE OAR INSTILLATION N/A 03/15/2018   Procedure: SPACE OAR INSTILLATION;  Surgeon: Crista Elliot, MD;  Location: [facility];  Service: Urology;  Laterality: N/A;   WISDOM TOOTH EXTRACTION     Patient Active Problem List   Diagnosis Date Noted   E coli bacteremia 06/01/2022   Sludge in gallbladder 05/30/2022   Hyperbilirubinemia 05/30/2022   Urinary incontinence 06/25/2021   Degenerative lumbar spinal stenosis 04/30/2021   Degenerative arthritis of knee, bilateral 03/27/2021  Chronic lymphocytic leukemia (HCC) 11/08/2020   Insulin-requiring or dependent type II diabetes mellitus (HCC) 09/25/2020   Stage 3a chronic kidney disease (HCC) 03/15/2020   Thiamine deficiency    Paroxysmal atrial fibrillation (HCC) 07/19/2019   Allergic rhinitis 06/09/2019   Erectile dysfunction due to diabetes mellitus (HCC) 06/09/2019   Overweight with body mass index (BMI) of 28 to 28.9 in adult 03/17/2019   WPW (Wolff-Parkinson-White syndrome) 02/05/2019   SBP (spontaneous bacterial peritonitis) (HCC)-December 2020,     Encephalopathy, hepatic (HCC)    Degenerative disc disease, cervical 07/21/2018   Cervical radiculopathy 07/16/2018   OAB (overactive bladder) 07/06/2018   Malignant neoplasm of prostate (HCC) 01/13/2018    Hyperlipidemia LDL goal <100 11/11/2017   Essential hypertension 11/11/2017   BPH associated with nocturia 11/11/2017   Erectile dysfunction due to arterial insufficiency 04/07/2017   Peripheral vascular disease (HCC) 06/25/2016   Alcoholic cirrhosis (HCC) 10/04/2015   Type 2 diabetes mellitus with complication, with long-term current use of insulin (HCC) 08/22/2015   Hypersomnolence 03/19/2014   Peripheral neuropathy 01/11/2013   Polyclonal gammopathy 01/11/2013   GERD 10/04/2009     REFERRING PROVIDER: Julio Sicks, MD   REFERRING DIAG: M54.16 (ICD-10-CM) - Radiculopathy, lumbar region  Rationale for Evaluation and Treatment: Rehabilitation  THERAPY DIAG:  Difficulty in walking, not elsewhere classified  Radiculopathy, lumbar region  Muscle weakness (generalized)  ONSET DATE: DOS 04/28/2022  SUBJECTIVE:                                                                                                                                                                                           SUBJECTIVE STATEMENT:  Pt reports continued buckling of knees that seems to come on randomly, especially after standing ~15 minutes. "I always catch myself when they give out." No pain reported following PT exercises. Does report improvement in R knee strength overall since starting PT.          PERTINENT HISTORY:  Lumbar decompression surgery on 04/27/2022 Lumbar MRI in 2023 showed mild retrolisthesis L1-2, mild anterolisthesis L3-4. And mild retrolisthesis L4-5. DM type 2 and Peripheral neuropathy CLL (chronic lymphocytic leukemia) Arthritis (X ray findings of OA in R hip and L knee) Prostate CA with radiation Alcoholic Cirrhosis WPW (Wolff-Parkinson-White syndrome), Pt states it doesn't affect him  PAIN:  Are you having pain? Yes NPRS:  2/10 current / 0/10 Location: bilat knees / lumbar 4/10 bilat knee pain with standing up  PRECAUTIONS: Other: per surgery ; mild retrolisthesis  and anterolisthesis ; CLL and peripheral neuropathy ; Pt reports having balance deficits  WEIGHT BEARING RESTRICTIONS: none indicated on MD note and  pt reports none also  FALLS:  Has patient fallen in last 6 months? No  LIVING ENVIRONMENT: Lives with: lives with their spouse Lives in: 1 story home Stairs: 1 step with 1 rail Has following equipment at home: cane  OCCUPATION: part time work.  Driving.  Pt does have to ambulate on uneven terrain.    PLOF: Independent.  Pt was able to ambulate 1.5 miles prior to increased lumbar and LE pain.  PATIENT GOALS: to be able to perform stairs   OBJECTIVE:   DIAGNOSTIC FINDINGS:  Lumbar MRI in 2023: -Alignment: Mild retrolisthesis L1-2. Mild anterolisthesis L3-4. Mild retrolisthesis L4-5. IMPRESSION: -Motion degraded study  -Negative for fracture or metastatic disease  -Lumbar spondylosis. Multilevel spinal and foraminal stenosis  X ray of R hip in 2023:  IMPRESSION: Mild osteoarthritis of the right hip.  X rays of L knee in 2023: IMPRESSION: Mild-to-moderate medial and patellofemoral osteoarthritis.  Chronic spurring at the patellar tendon insertion on the tibial tubercle.   TODAY'S TREATMENT:                                                                                                                                      Nustep L4 bilat Ues/LE's x 5 mins 6" step ups 2x10 ea Lunges at rail x10ea Modified deadlift at wall with 5# KB Lateral flexion with 5# KB x15ea  Side stepping with GTB at ankles x3laps at raill    Staggered STS 2x10ea               Leg extension machine 30# 2x10               Hip abduction machine 85# 3x10               Leg press (black) 50# 3x10        PATIENT EDUCATION:  Education details:  POC, rationale of interventions, exercise form, HEP, and relevant anatomy.  Person educated: Patient Education method: Explanation, demonstration, verbal cues, handout Education comprehension: verbalized  understanding, returned demonstration, verbal cues required  HOME EXERCISE PROGRAM: Access Code: [email or web address] URL: [email or web address] Date: 08/11/2022 Prepared by: Riki Altes  Exercises - Hooklying Hamstring Stretch with Strap  - 1 x daily - 7 x weekly - 3 sets - 30 seconds hold - Supine Active Straight Leg Raise  - 1 x daily - 7 x weekly - 3 sets - 10 reps - Sidelying Hip Abduction  - 1 x daily - 7 x weekly - 3 sets - 10 reps - Sit to Stand Without Arm Support  - 1 x daily - 7 x weekly - 3 sets - 10 reps  Updated HEP: - Supine Transversus Abdominis Bracing - Hands on Stomach  - 2 x daily - 7 x weekly - 2 sets - 10 reps - Clamshell  - 1 x daily - 5-6 x weekly - 2 sets - 10 reps  ASSESSMENT:  CLINICAL IMPRESSION: Continued to work on LE strengthening as well as core strengthening. Trialled partial dead lifts at wall with cues required for proper hip hinge. Cues were also required with sit to stands for full extension upon standing. Difficulty reported with step ups and partial lunges at rail. Will continue to monitor knee buckling and progress as tolerated.   OBJECTIVE IMPAIRMENTS: Abnormal gait, decreased activity tolerance, decreased endurance, decreased mobility, difficulty walking, decreased strength, and pain.   ACTIVITY LIMITATIONS: sitting, stairs, and locomotion level  PARTICIPATION LIMITATIONS: shopping and community activity  PERSONAL FACTORS: 3+ comorbidities: CLL, DM Type 2, arthritis, and peripheral neuropathy. Pt reports having balance deficits  are also affecting patient's functional outcome.   REHAB POTENTIAL: Good  CLINICAL DECISION MAKING: Stable/uncomplicated  EVALUATION COMPLEXITY: Low   GOALS:  SHORT TERM GOALS: Target date: 08/27/2022   Pt will be independent and compliant with HEP for improved pain, strength, ROM, and function.   Baseline: Goal status: MET 7/22  2.  Pt will report at least a 25% improvement in his mobility overall.   Baseline:  Goal status: GOAL MET  3.  Pt will be able to perform a 6 inch step up with good form and good control. Baseline:  Goal status: IN PROGRESS 7/29 Target date:  09/03/2022  4.  Pt will report improved balance with his daily mobility and daily activities.  Baseline:  Goal status: INITIAL Target date:  09/03/2022    LONG TERM GOALS: Target date: 10/01/2022  Pt will demo improved 5x STS test to no > 16 sec for improved functional LE strength and improved performance of transfers.  Baseline:  Goal status: Progressing--7/24  2.  Pt will demo improved R hip flex and knee flexion (seated) strength to 5/5 MMT and bilat hip abd strength by at least 5#'s (HHD) for improved tolerance with and performance of functional mobility.   Baseline:  Goal status: INITIAL  3.  Pt will report he is able to perform extended community ambulation distance without significant difficulty and without significant pain.   Baseline:  Goal status: INITIAL  4.  Pt will be able to perform stairs with a reciprocal gait with rail without significant difficulty and with good control.  Baseline:  Goal status: IN PROGRESS 7/29    PLAN:  PT FREQUENCY: 2x/week  PT DURATION: 6-8 weeks though pt may be out of town at times during the cert period  PLANNED INTERVENTIONS: Therapeutic exercises, Therapeutic activity, Neuromuscular re-education, Balance training, Gait training, Patient/Family education, Self Care, Joint mobilization, Stair training, DME instructions, Aquatic Therapy, Cryotherapy, scar mobilization, Taping, Manual therapy, and Re-evaluation.  PLAN FOR NEXT SESSION: Cont with core and LE strengthening.  Balance exercises including standing with FT and airex exercises.  Review and perform supine TrA contractions.      Riki Altes, PTA  09/23/22 3:47 PM

## 2022-09-25 ENCOUNTER — Encounter (HOSPITAL_BASED_OUTPATIENT_CLINIC_OR_DEPARTMENT_OTHER): Payer: Self-pay

## 2022-09-25 ENCOUNTER — Ambulatory Visit (HOSPITAL_BASED_OUTPATIENT_CLINIC_OR_DEPARTMENT_OTHER): Payer: Medicare Other

## 2022-09-25 DIAGNOSIS — M6281 Muscle weakness (generalized): Secondary | ICD-10-CM

## 2022-09-25 DIAGNOSIS — M5416 Radiculopathy, lumbar region: Secondary | ICD-10-CM | POA: Diagnosis not present

## 2022-09-25 DIAGNOSIS — R262 Difficulty in walking, not elsewhere classified: Secondary | ICD-10-CM

## 2022-09-25 NOTE — Therapy (Addendum)
OUTPATIENT PHYSICAL THERAPY THORACOLUMBAR TREATMENT   Patient Name: Patrick Kidd MRN: [id number] DOB:[birth year], 77 y.o., male Today's Date: 09/25/2022  END OF SESSION:  PT End of Session - 09/25/22 1403     Visit Number 11    Number of Visits 19   Date for PT Re-Evaluation 11/06/22    Authorization Type MCR A & B    PT Start Time 1349    PT Stop Time 1437    PT Time Calculation (min) 48 min    Activity Tolerance Patient tolerated treatment well    Behavior During Therapy WFL for tasks assessed/performed                    Past Medical History:  Diagnosis Date   Acrophobia    Alcoholic cirrhosis (HCC) 10/04/2015   Anemia    Arthritis    foot by big toe   BPH associated with nocturia    Cataract    removed both eyes   Cholecystitis 05/2022   tx Abx   Chronic cough    PMH of   CLL (chronic lymphocytic leukemia) (HCC)    COVID-19 12/2018   Diabetes mellitus without complication (HCC)    Diverticulosis 07/03/2010   Colonoscopy.    Duodenal ulcer 2017   Fallen arches    Bilateral   GERD (gastroesophageal reflux disease)    Gout    Granuloma annulare    Hx of adenomatous colonic polyps multiple   Hydrocele 2011   Large septated right hydrocele   Liver cyst    Liver lesion    Nonspecific elevation of levels of transaminase or lactic acid dehydrogenase (LDH)    Obesity    Peripheral neuropathy    Plantar fasciitis    PMH of   Portal hypertension (HCC) 2017   Prostate cancer (HCC)    Right shoulder pain 11/2017   Sleep apnea    no cpap, patient denies   Thiamine deficiency    Wears reading eyeglasses    WPW (Wolff-Parkinson-White syndrome) 02/05/2019   Past Surgical History:  Procedure Laterality Date   BACK SURGERY  04/28/2022   CATARACT EXTRACTION, BILATERAL  12/2011   Dr Nile Riggs   COLONOSCOPY  2017   COLONOSCOPY W/ POLYPECTOMY  07/03/2010   2 adenomas, diverticulosis on right. Dr Leone Payor   CYSTOSCOPY N/A 03/15/2018   Procedure:  Derinda Late;  Surgeon: Crista Elliot, MD;  Location: [facility];  Service: Urology;  Laterality: N/A;  NO SEEDS FOUND IN BLADDER   ESOPHAGOGASTRODUODENOSCOPY (EGD) WITH PROPOFOL N/A 01/08/2016   Procedure: ESOPHAGOGASTRODUODENOSCOPY (EGD) WITH PROPOFOL;  Surgeon: Meryl Dare, MD;  Location: [facility];  Service: Endoscopy;  Laterality: N/A;   FLEXIBLE SIGMOIDOSCOPY  2000   RADIOACTIVE SEED IMPLANT N/A 03/15/2018   Procedure: RADIOACTIVE SEED IMPLANT/BRACHYTHERAPY IMPLANT;  Surgeon: Crista Elliot, MD;  Location: [facility];  Service: Urology;  Laterality: N/A;   73 SEEDS IMPLANTED   SIGMOIDOSCOPY     SPACE OAR INSTILLATION N/A 03/15/2018   Procedure: SPACE OAR INSTILLATION;  Surgeon: Crista Elliot, MD;  Location: [facility];  Service: Urology;  Laterality: N/A;   WISDOM TOOTH EXTRACTION     Patient Active Problem List   Diagnosis Date Noted   E coli bacteremia 06/01/2022   Sludge in gallbladder 05/30/2022   Hyperbilirubinemia 05/30/2022   Urinary incontinence 06/25/2021   Degenerative lumbar spinal stenosis 04/30/2021   Degenerative arthritis of knee, bilateral 03/27/2021  Chronic lymphocytic leukemia (HCC) 11/08/2020   Insulin-requiring or dependent type II diabetes mellitus (HCC) 09/25/2020   Stage 3a chronic kidney disease (HCC) 03/15/2020   Thiamine deficiency    Paroxysmal atrial fibrillation (HCC) 07/19/2019   Allergic rhinitis 06/09/2019   Erectile dysfunction due to diabetes mellitus (HCC) 06/09/2019   Overweight with body mass index (BMI) of 28 to 28.9 in adult 03/17/2019   WPW (Wolff-Parkinson-White syndrome) 02/05/2019   SBP (spontaneous bacterial peritonitis) (HCC)-December 2020,     Encephalopathy, hepatic (HCC)    Degenerative disc disease, cervical 07/21/2018   Cervical radiculopathy 07/16/2018   OAB (overactive bladder) 07/06/2018   Malignant neoplasm of prostate (HCC) 01/13/2018    Hyperlipidemia LDL goal <100 11/11/2017   Essential hypertension 11/11/2017   BPH associated with nocturia 11/11/2017   Erectile dysfunction due to arterial insufficiency 04/07/2017   Peripheral vascular disease (HCC) 06/25/2016   Alcoholic cirrhosis (HCC) 10/04/2015   Type 2 diabetes mellitus with complication, with long-term current use of insulin (HCC) 08/22/2015   Hypersomnolence 03/19/2014   Peripheral neuropathy 01/11/2013   Polyclonal gammopathy 01/11/2013   GERD 10/04/2009     REFERRING PROVIDER: Julio Sicks, MD   REFERRING DIAG: M54.16 (ICD-10-CM) - Radiculopathy, lumbar region  Rationale for Evaluation and Treatment: Rehabilitation  THERAPY DIAG:  Difficulty in walking, not elsewhere classified  Muscle weakness (generalized)  Radiculopathy, lumbar region  ONSET DATE: DOS 04/28/2022  SUBJECTIVE:                                                                                                                                                                                           SUBJECTIVE STATEMENT:  Pt reports no knee buckling since last session. No pain at entry. 0/10        PERTINENT HISTORY:  Lumbar decompression surgery on 04/27/2022 Lumbar MRI in 2023 showed mild retrolisthesis L1-2, mild anterolisthesis L3-4. And mild retrolisthesis L4-5. DM type 2 and Peripheral neuropathy CLL (chronic lymphocytic leukemia) Arthritis (X ray findings of OA in R hip and L knee) Prostate CA with radiation Alcoholic Cirrhosis WPW (Wolff-Parkinson-White syndrome), Pt states it doesn't affect him  PAIN:  Are you having pain? No NPRS:  0/10 current / 0/10 Location: bilat knees / lumbar 4/10 bilat knee pain with standing up  PRECAUTIONS: Other: per surgery ; mild retrolisthesis and anterolisthesis ; CLL and peripheral neuropathy ; Pt reports having balance deficits  WEIGHT BEARING RESTRICTIONS: none indicated on MD note and pt reports none also  FALLS:  Has patient  fallen in last 6 months? No  LIVING ENVIRONMENT: Lives with: lives with their spouse Lives in: 1 story home Stairs: 1  step with 1 rail Has following equipment at home: cane  OCCUPATION: part time work.  Driving.  Pt does have to ambulate on uneven terrain.    PLOF: Independent.  Pt was able to ambulate 1.5 miles prior to increased lumbar and LE pain.  PATIENT GOALS: to be able to perform stairs   OBJECTIVE:   LOWER EXTREMITY MMT:     MMT Right eval Left eval R 8/22 L 8/22  Hip flexion 4+/5 5/5 5/5 5/5  Hip extension        Hip abduction 33.0 36.3 39.0 39.3  Hip adduction        Hip internal rotation        Hip external rotation        Knee flexion 4/5 seated 5/5 / seated with knee pain 5/5 5/5  Knee extension 5/5 4+/5 4/5 5/5  Ankle dorsiflexion 5/5 5/5    Ankle plantarflexion WFL seated WFL seated    Ankle inversion        Ankle eversion         (Blank rows = not tested)     FUNCTIONAL TESTS:  5x STS test: eval: 28.9 without UE support   8/22: 11 seconds without UE support    DIAGNOSTIC FINDINGS:  Lumbar MRI in 2023: -Alignment: Mild retrolisthesis L1-2. Mild anterolisthesis L3-4. Mild retrolisthesis L4-5. IMPRESSION: -Motion degraded study  -Negative for fracture or metastatic disease  -Lumbar spondylosis. Multilevel spinal and foraminal stenosis  X ray of R hip in 2023:  IMPRESSION: Mild osteoarthritis of the right hip.  X rays of L knee in 2023: IMPRESSION: Mild-to-moderate medial and patellofemoral osteoarthritis.  Chronic spurring at the patellar tendon insertion on the tibial tubercle.   TODAY'S TREATMENT:                                                                                                                                   FOTO:57% (met goal) Reviewed goals and updated measurements    Nustep L5 bilat Ues/LE's x 6 mins Hurdles x4 fwd and lateral x4 laps each Tandem walking along ail x1 lap Standing marching x15 Modified  golfer's reach to chair x5 ea (unable to complete with rail assist) Standing trunk rotation with pivot holding 2# bar x10         PATIENT EDUCATION:  Education details:  POC, rationale of interventions, exercise form, HEP, and relevant anatomy.  Person educated: Patient Education method: Explanation, demonstration, verbal cues, handout Education comprehension: verbalized understanding, returned demonstration, verbal cues required  HOME EXERCISE PROGRAM: Access Code: [email or web address] URL: [email or web address] Date: 08/11/2022 Prepared by: Riki Altes  Exercises - Hooklying Hamstring Stretch with Strap  - 1 x daily - 7 x weekly - 3 sets - 30 seconds hold - Supine Active Straight Leg Raise  - 1 x daily - 7 x weekly - 3 sets - 10 reps - Sidelying Hip Abduction  - 1 x daily - 7  x weekly - 3 sets - 10 reps - Sit to Stand Without Arm Support  - 1 x daily - 7 x weekly - 3 sets - 10 reps  Updated HEP: - Supine Transversus Abdominis Bracing - Hands on Stomach  - 2 x daily - 7 x weekly - 2 sets - 10 reps - Clamshell  - 1 x daily - 5-6 x weekly - 2 sets - 10 reps  ASSESSMENT:  CLINICAL IMPRESSION: Pt has attended 11 visits and has made excellent progress towards goals. He has met FOTO goal and has met most PT goals. Excellent improvement in 5xSTS time today at 11 seconds.  Pt does note his primary limitation at this point is apprehension with stair climbing and decreased balance and stability. Apprehension observed with weight shifting tasks and single leg activities performed in clinic today. Unable to navigate obstacles (hurdles) with UE support from rail. Pt will benefit from additional PT to improve balance with ADLS, improve quad strength, and decrease fear of falling.   OBJECTIVE IMPAIRMENTS: Abnormal gait, decreased activity tolerance, decreased endurance, decreased mobility, difficulty walking, decreased strength, and pain.   ACTIVITY LIMITATIONS: sitting, stairs, and locomotion  level  PARTICIPATION LIMITATIONS: shopping and community activity  PERSONAL FACTORS: 3+ comorbidities: CLL, DM Type 2, arthritis, and peripheral neuropathy. Pt reports having balance deficits  are also affecting patient's functional outcome.   REHAB POTENTIAL: Good  CLINICAL DECISION MAKING: Stable/uncomplicated  EVALUATION COMPLEXITY: Low   GOALS:  SHORT TERM GOALS: Target date: 08/27/2022   Pt will be independent and compliant with HEP for improved pain, strength, ROM, and function.   Baseline: Goal status: MET 7/22  2.  Pt will report at least a 25% improvement in his mobility overall.  Baseline:  Goal status: GOAL MET  3.  Pt will be able to perform a 6 inch step up with good form and good control. Baseline:  Goal status: MET 8/22 Target date:  09/03/2022  4.  Pt will report improved balance with his daily mobility and daily activities.  Baseline:  Goal status: IN PROGRESS (has difficulty with turns- 8/22) Target date:  10/23/2022     LONG TERM GOALS: Target date: 10/01/2022  Pt will demo improved 5x STS test to no > 16 sec for improved functional LE strength and improved performance of transfers.  Baseline:  Goal status: MET 8/22  2.  Pt will demo improved R knee extension strength to 5/5 MMT, R hip flex and knee flexion (seated) strength to 5/5 MMT and bilat hip abd strength by at least 5#'s (HHD) for improved tolerance with and performance of functional mobility.   Baseline:   Goal status: IN PROGRESS 8/22.  PT modified goal Target date:  11/06/2022  3.  Pt will report he is able to perform extended community ambulation distance without significant difficulty and without significant pain.   Baseline:  Goal status: MET (tires after ~ 1/2 mile due to CLL- knees/back do not bother him 8/22)  4.  Pt will be able to perform stairs with a reciprocal gait with rail without significant difficulty and with good control.  Baseline:  Goal status: MET 8/22. Needs  railing.  5.  Pt will demo improved balance and proprioception as evidenced by performance of proprioceptive exercises with improved control and stability in order for improved performance of functional mobility including stairs with increased confidence and stability.    Goal status: INITIAL Target date:  11/06/2022    PLAN:  PT FREQUENCY: 1-2x/week  PT DURATION: 4 - 6 weeks   PLANNED INTERVENTIONS: Therapeutic exercises, Therapeutic activity, Neuromuscular re-education, Balance training, Gait training, Patient/Family education, Self Care, Joint mobilization, Stair training, DME instructions, Aquatic Therapy, Cryotherapy, scar mobilization, Taping, Manual therapy, and Re-evaluation.  PLAN FOR NEXT SESSION: Cont with core and LE strengthening.  Balance exercises including standing with FT and airex exercises.  Review and perform supine TrA contractions.      Riki Altes, PTA  09/25/22 3:09 PM   PT spoke with pt and PTA and agrees with POC.  PT modified goals and updated POC.  PT will send re-cert to MD to continue with PT.   Audie Clear III PT, DPT 09/26/22 10:35 PM.

## 2022-09-26 NOTE — Addendum Note (Signed)
Addended by: Susy Manor on: 09/26/2022 10:54 PM   Modules accepted: Orders

## 2022-09-30 ENCOUNTER — Encounter (HOSPITAL_BASED_OUTPATIENT_CLINIC_OR_DEPARTMENT_OTHER): Payer: Self-pay

## 2022-09-30 ENCOUNTER — Ambulatory Visit (HOSPITAL_BASED_OUTPATIENT_CLINIC_OR_DEPARTMENT_OTHER): Payer: Medicare Other

## 2022-09-30 DIAGNOSIS — M5416 Radiculopathy, lumbar region: Secondary | ICD-10-CM | POA: Diagnosis not present

## 2022-09-30 DIAGNOSIS — M6281 Muscle weakness (generalized): Secondary | ICD-10-CM | POA: Diagnosis not present

## 2022-09-30 DIAGNOSIS — R262 Difficulty in walking, not elsewhere classified: Secondary | ICD-10-CM | POA: Diagnosis not present

## 2022-09-30 NOTE — Therapy (Signed)
OUTPATIENT PHYSICAL THERAPY THORACOLUMBAR TREATMENT   Patient Name: Patrick Kidd MRN: [id number] DOB:03/09/1945, 77 y.o., male Today's Date: 09/30/2022  END OF SESSION:  PT End of Session - 09/25/22 1403     Visit Number 11    Number of Visits 19   Date for PT Re-Evaluation 11/06/22    Authorization Type MCR A & B    PT Start Time 1349    PT Stop Time 1437    PT Time Calculation (min) 48 min    Activity Tolerance Patient tolerated treatment well    Behavior During Therapy WFL for tasks assessed/performed                    Past Medical History:  Diagnosis Date   Acrophobia    Alcoholic cirrhosis (HCC) 10/04/2015   Anemia    Arthritis    foot by big toe   BPH associated with nocturia    Cataract    removed both eyes   Cholecystitis 05/2022   tx Abx   Chronic cough    PMH of   CLL (chronic lymphocytic leukemia) (HCC)    COVID-19 12/2018   Diabetes mellitus without complication (HCC)    Diverticulosis 07/03/2010   Colonoscopy.    Duodenal ulcer 2017   Fallen arches    Bilateral   GERD (gastroesophageal reflux disease)    Gout    Granuloma annulare    Hx of adenomatous colonic polyps multiple   Hydrocele 2011   Large septated right hydrocele   Liver cyst    Liver lesion    Nonspecific elevation of levels of transaminase or lactic acid dehydrogenase (LDH)    Obesity    Peripheral neuropathy    Plantar fasciitis    PMH of   Portal hypertension (HCC) 2017   Prostate cancer (HCC)    Right shoulder pain 11/2017   Sleep apnea    no cpap, patient denies   Thiamine deficiency    Wears reading eyeglasses    WPW (Wolff-Parkinson-White syndrome) 02/05/2019   Past Surgical History:  Procedure Laterality Date   BACK SURGERY  04/28/2022   CATARACT EXTRACTION, BILATERAL  12/2011   Dr Nile Riggs   COLONOSCOPY  2017   COLONOSCOPY W/ POLYPECTOMY  07/03/2010   2 adenomas, diverticulosis on right. Dr Leone Payor   CYSTOSCOPY N/A 03/15/2018   Procedure:  Derinda Late;  Surgeon: Crista Elliot, MD;  Location: [facility];  Service: Urology;  Laterality: N/A;  NO SEEDS FOUND IN BLADDER   ESOPHAGOGASTRODUODENOSCOPY (EGD) WITH PROPOFOL N/A 01/08/2016   Procedure: ESOPHAGOGASTRODUODENOSCOPY (EGD) WITH PROPOFOL;  Surgeon: Meryl Dare, MD;  Location: [facility];  Service: Endoscopy;  Laterality: N/A;   FLEXIBLE SIGMOIDOSCOPY  2000   RADIOACTIVE SEED IMPLANT N/A 03/15/2018   Procedure: RADIOACTIVE SEED IMPLANT/BRACHYTHERAPY IMPLANT;  Surgeon: Crista Elliot, MD;  Location: [facility];  Service: Urology;  Laterality: N/A;   73 SEEDS IMPLANTED   SIGMOIDOSCOPY     SPACE OAR INSTILLATION N/A 03/15/2018   Procedure: SPACE OAR INSTILLATION;  Surgeon: Crista Elliot, MD;  Location: [facility];  Service: Urology;  Laterality: N/A;   WISDOM TOOTH EXTRACTION     Patient Active Problem List   Diagnosis Date Noted   E coli bacteremia 06/01/2022   Sludge in gallbladder 05/30/2022   Hyperbilirubinemia 05/30/2022   Urinary incontinence 06/25/2021   Degenerative lumbar spinal stenosis 04/30/2021   Degenerative arthritis of knee, bilateral 03/27/2021  Chronic lymphocytic leukemia (HCC) 11/08/2020   Insulin-requiring or dependent type II diabetes mellitus (HCC) 09/25/2020   Stage 3a chronic kidney disease (HCC) 03/15/2020   Thiamine deficiency    Paroxysmal atrial fibrillation (HCC) 07/19/2019   Allergic rhinitis 06/09/2019   Erectile dysfunction due to diabetes mellitus (HCC) 06/09/2019   Overweight with body mass index (BMI) of 28 to 28.9 in adult 03/17/2019   WPW (Wolff-Parkinson-White syndrome) 02/05/2019   SBP (spontaneous bacterial peritonitis) (HCC)-December 2020,     Encephalopathy, hepatic (HCC)    Degenerative disc disease, cervical 07/21/2018   Cervical radiculopathy 07/16/2018   OAB (overactive bladder) 07/06/2018   Malignant neoplasm of prostate (HCC) 01/13/2018    Hyperlipidemia LDL goal <100 11/11/2017   Essential hypertension 11/11/2017   BPH associated with nocturia 11/11/2017   Erectile dysfunction due to arterial insufficiency 04/07/2017   Peripheral vascular disease (HCC) 06/25/2016   Alcoholic cirrhosis (HCC) 10/04/2015   Type 2 diabetes mellitus with complication, with long-term current use of insulin (HCC) 08/22/2015   Hypersomnolence 03/19/2014   Peripheral neuropathy 01/11/2013   Polyclonal gammopathy 01/11/2013   GERD 10/04/2009     REFERRING PROVIDER: Julio Sicks, MD   REFERRING DIAG: M54.16 (ICD-10-CM) - Radiculopathy, lumbar region  Rationale for Evaluation and Treatment: Rehabilitation  THERAPY DIAG:  Radiculopathy, lumbar region  Muscle weakness (generalized)  Difficulty in walking, not elsewhere classified  ONSET DATE: DOS 04/28/2022  SUBJECTIVE:                                                                                                                                                                                           SUBJECTIVE STATEMENT:  Pt has 2/10 pain level. Increased pain from standing at [facility] yesterday. Also had to [place]. No instances of buckling. "The right one almost buckled on me going down the steps today."        PERTINENT HISTORY:  Lumbar decompression surgery on 04/27/2022 Lumbar MRI in 2023 showed mild retrolisthesis L1-2, mild anterolisthesis L3-4. And mild retrolisthesis L4-5. DM type 2 and Peripheral neuropathy CLL (chronic lymphocytic leukemia) Arthritis (X ray findings of OA in R hip and L knee) Prostate CA with radiation Alcoholic Cirrhosis WPW (Wolff-Parkinson-White syndrome), Pt states it doesn't affect him  PAIN:  Are you having pain? No NPRS:  2/10 current / 0/10 Location: bilat knees / lumbar 4/10 bilat knee pain with standing up  PRECAUTIONS: Other: per surgery ; mild retrolisthesis and anterolisthesis ; CLL and peripheral  neuropathy ; Pt reports having balance deficits  WEIGHT BEARING RESTRICTIONS: none indicated on MD note and pt reports none  also  FALLS:  Has patient fallen in last 6 months? No  LIVING ENVIRONMENT: Lives with: lives with their spouse Lives in: 1 story home Stairs: 1 step with 1 rail Has following equipment at home: cane  OCCUPATION: part time work.  Driving.  Pt does have to ambulate on uneven terrain.    PLOF: Independent.  Pt was able to ambulate 1.5 miles prior to increased lumbar and LE pain.  PATIENT GOALS: to be able to perform stairs   OBJECTIVE:   LOWER EXTREMITY MMT:     MMT Right eval Left eval R 8/22 L 8/22  Hip flexion 4+/5 5/5 5/5 5/5  Hip extension        Hip abduction 33.0 36.3 39.0 39.3  Hip adduction        Hip internal rotation        Hip external rotation        Knee flexion 4/5 seated 5/5 / seated with knee pain 5/5 5/5  Knee extension 5/5 4+/5 4/5 5/5  Ankle dorsiflexion 5/5 5/5    Ankle plantarflexion WFL seated WFL seated    Ankle inversion        Ankle eversion         (Blank rows = not tested)     FUNCTIONAL TESTS:  5x STS test: eval: 28.9 without UE support   8/22: 11 seconds without UE support    DIAGNOSTIC FINDINGS:  Lumbar MRI in 2023: -Alignment: Mild retrolisthesis L1-2. Mild anterolisthesis L3-4. Mild retrolisthesis L4-5. IMPRESSION: -Motion degraded study  -Negative for fracture or metastatic disease  -Lumbar spondylosis. Multilevel spinal and foraminal stenosis  X ray of R hip in 2023:  IMPRESSION: Mild osteoarthritis of the right hip.  X rays of L knee in 2023: IMPRESSION: Mild-to-moderate medial and patellofemoral osteoarthritis.  Chronic spurring at the patellar tendon insertion on the tibial tubercle.   TODAY'S TREATMENT:                                                                                                                                       Nustep L5 bilat Ues/LE's x 5 mins Hurdles x4 fwd and  lateral x4 laps each Side stepping with GTB at ankles x2 laps Step up on airex x10ea Lateral step up/over airex x10 Heel/toe raises x20 Modified golfer's reach to chair x5 ea (unable to complete with rail assist) Staggered STS x10ea         PATIENT EDUCATION:  Education details:  POC, rationale of interventions, exercise form, HEP, and relevant anatomy.  Person educated: Patient Education method: Explanation, demonstration, verbal cues, handout Education comprehension: verbalized understanding, returned demonstration, verbal cues required  HOME EXERCISE PROGRAM: Access Code: [email or web address] URL: [email or web address] Date: 08/11/2022 Prepared by: Riki Altes  Exercises - Hooklying Hamstring Stretch with Strap  - 1 x daily - 7 x weekly - 3 sets - 30 seconds hold - Supine Active Straight Leg  Raise  - 1 x daily - 7 x weekly - 3 sets - 10 reps - Sidelying Hip Abduction  - 1 x daily - 7 x weekly - 3 sets - 10 reps - Sit to Stand Without Arm Support  - 1 x daily - 7 x weekly - 3 sets - 10 reps  Updated HEP: - Supine Transversus Abdominis Bracing - Hands on Stomach  - 2 x daily - 7 x weekly - 2 sets - 10 reps - Clamshell  - 1 x daily - 5-6 x weekly - 2 sets - 10 reps  ASSESSMENT:  CLINICAL IMPRESSION: Pt is challenged by golfer's lift as well as airex step ups due to instability. Improved ability with hurdles, though does require cues for full foot clearance in fwd direction. He had increased fatigue in knees today than usual. Will continue to monitor this and progress as tolerated.   OBJECTIVE IMPAIRMENTS: Abnormal gait, decreased activity tolerance, decreased endurance, decreased mobility, difficulty walking, decreased strength, and pain.   ACTIVITY LIMITATIONS: sitting, stairs, and locomotion level  PARTICIPATION LIMITATIONS: shopping and community activity  PERSONAL FACTORS: 3+ comorbidities: CLL, DM Type 2, arthritis, and peripheral neuropathy. Pt reports having  balance deficits  are also affecting patient's functional outcome.   REHAB POTENTIAL: Good  CLINICAL DECISION MAKING: Stable/uncomplicated  EVALUATION COMPLEXITY: Low   GOALS:  SHORT TERM GOALS: Target date: 08/27/2022   Pt will be independent and compliant with HEP for improved pain, strength, ROM, and function.   Baseline: Goal status: MET 7/22  2.  Pt will report at least a 25% improvement in his mobility overall.  Baseline:  Goal status: GOAL MET  3.  Pt will be able to perform a 6 inch step up with good form and good control. Baseline:  Goal status: MET 8/22 Target date:  09/03/2022  4.  Pt will report improved balance with his daily mobility and daily activities.  Baseline:  Goal status: IN PROGRESS (has difficulty with turns- 8/22) Target date:  10/23/2022     LONG TERM GOALS: Target date: 10/01/2022  Pt will demo improved 5x STS test to no > 16 sec for improved functional LE strength and improved performance of transfers.  Baseline:  Goal status: MET 8/22  2.  Pt will demo improved R knee extension strength to 5/5 MMT, R hip flex and knee flexion (seated) strength to 5/5 MMT and bilat hip abd strength by at least 5#'s (HHD) for improved tolerance with and performance of functional mobility.   Baseline:   Goal status: IN PROGRESS 8/22.  PT modified goal Target date:  11/06/2022  3.  Pt will report he is able to perform extended community ambulation distance without significant difficulty and without significant pain.   Baseline:  Goal status: MET (tires after ~ 1/2 mile due to CLL- knees/back do not bother him 8/22)  4.  Pt will be able to perform stairs with a reciprocal gait with rail without significant difficulty and with good control.  Baseline:  Goal status: MET 8/22. Needs railing.  5.  Pt will demo improved balance and proprioception as evidenced by performance of proprioceptive exercises with improved control and stability in order for improved  performance of functional mobility including stairs with increased confidence and stability.    Goal status: INITIAL Target date:  11/06/2022    PLAN:  PT FREQUENCY: 1-2x/week  PT DURATION: 4 - 6 weeks   PLANNED INTERVENTIONS: Therapeutic exercises, Therapeutic activity, Neuromuscular re-education, Balance training, Gait training,  Patient/Family education, Self Care, Joint mobilization, Stair training, DME instructions, Aquatic Therapy, Cryotherapy, scar mobilization, Taping, Manual therapy, and Re-evaluation.  PLAN FOR NEXT SESSION: Cont with core and LE strengthening.  Balance exercises including standing with FT and airex exercises.  Review and perform supine TrA contractions.      Riki Altes, PTA  09/30/22 2:59 PM

## 2022-10-01 ENCOUNTER — Other Ambulatory Visit: Payer: Self-pay | Admitting: Internal Medicine

## 2022-10-01 DIAGNOSIS — E118 Type 2 diabetes mellitus with unspecified complications: Secondary | ICD-10-CM

## 2022-10-02 ENCOUNTER — Ambulatory Visit (HOSPITAL_BASED_OUTPATIENT_CLINIC_OR_DEPARTMENT_OTHER): Payer: Medicare Other

## 2022-10-13 ENCOUNTER — Encounter: Payer: Self-pay | Admitting: Family Medicine

## 2022-10-13 ENCOUNTER — Ambulatory Visit (HOSPITAL_BASED_OUTPATIENT_CLINIC_OR_DEPARTMENT_OTHER): Payer: Medicare Other | Attending: Neurosurgery | Admitting: Physical Therapy

## 2022-10-13 ENCOUNTER — Ambulatory Visit (INDEPENDENT_AMBULATORY_CARE_PROVIDER_SITE_OTHER): Payer: Medicare Other | Admitting: Family Medicine

## 2022-10-13 ENCOUNTER — Telehealth: Payer: Self-pay

## 2022-10-13 ENCOUNTER — Ambulatory Visit (INDEPENDENT_AMBULATORY_CARE_PROVIDER_SITE_OTHER): Payer: Medicare Other

## 2022-10-13 ENCOUNTER — Other Ambulatory Visit: Payer: Self-pay

## 2022-10-13 VITALS — BP 130/82 | HR 93 | Ht 75.0 in | Wt 232.0 lb

## 2022-10-13 DIAGNOSIS — R262 Difficulty in walking, not elsewhere classified: Secondary | ICD-10-CM | POA: Insufficient documentation

## 2022-10-13 DIAGNOSIS — M25562 Pain in left knee: Secondary | ICD-10-CM | POA: Diagnosis not present

## 2022-10-13 DIAGNOSIS — M25561 Pain in right knee: Secondary | ICD-10-CM | POA: Diagnosis not present

## 2022-10-13 DIAGNOSIS — M170 Bilateral primary osteoarthritis of knee: Secondary | ICD-10-CM

## 2022-10-13 DIAGNOSIS — M6281 Muscle weakness (generalized): Secondary | ICD-10-CM | POA: Diagnosis not present

## 2022-10-13 DIAGNOSIS — M5416 Radiculopathy, lumbar region: Secondary | ICD-10-CM | POA: Insufficient documentation

## 2022-10-13 DIAGNOSIS — M1712 Unilateral primary osteoarthritis, left knee: Secondary | ICD-10-CM | POA: Diagnosis not present

## 2022-10-13 DIAGNOSIS — G8929 Other chronic pain: Secondary | ICD-10-CM

## 2022-10-13 NOTE — Telephone Encounter (Signed)
 VOB initiated for GELSYN for BILAT knee OA.

## 2022-10-13 NOTE — Patient Instructions (Addendum)
Thank you for coming in today.   Please get an Xray today before you leave   You received an injection today. Seek immediate medical attention if the joint becomes red, extremely painful, or is oozing fluid.   We will work to authorize [employer]  Our office will give you a call once we get your insurance's approval

## 2022-10-13 NOTE — Telephone Encounter (Signed)
-----   Message from Adron Bene sent at 10/13/2022  1:19 PM EDT ----- Regarding: Patrick Kidd Please authorize a single dose gel shot, bilaterally

## 2022-10-13 NOTE — Progress Notes (Signed)
I, Stevenson Clinch, CMA acting as a scribe for Patrick Graham, MD.  Patrick Kidd is a 77 y.o. male who presents to [facility] today for bilat knee pain. Pt was last seen by Dr. Denyse Amass on 06/07/22 for his L knee that was injected w/ Monovisc.   Today, pt c/o bilat knee pain x 2-3 months. Pt locates pain to medial aspect. Denies swelling. Weakness in the left knee. The knee feels unstable but no falls.   Knee swelling: no Mechanical symptoms: gives out occasionally Radiates: no Aggravates: WB, ambulation Treatments tried: no meds, going for PT.   Dx imaging: 06/06/21 L knee XR  Pertinent review of systems: No fever or chills  Relevant historical information: Peripheral vascular disease and Wolff-Parkinson-White.  History of cirrhosis and diabetes.   Exam:  BP 130/82   Pulse 93   Ht 6\' 3"  (1.905 m)   Wt 232 lb (105.2 kg)   SpO2 97%   BMI 29.00 kg/m  General: Well Developed, well nourished, and in no acute distress.   MSK: Left knee mild effusion normal-appearing otherwise normal motion.  Tender palpation medial joint line. Stable ligamentous exam. Positive medial McMurray's test. Intact strength  Right knee minimal effusion normal-appearing otherwise. Normal motion. Nontender. Stable ligamentous exam.  Negative McMurray's test. Intact strength.    Lab and Radiology Results  Procedure: Real-time Ultrasound Guided Injection of left knee superior lateral patellar space Device: Philips Affiniti 50G/GE Logiq Images permanently stored and available for review in PACS Ultrasound evaluation prior to injection reveals mild joint effusion.  Mild degeneration medial joint line. Verbal informed consent obtained.  Discussed risks and benefits of procedure. Warned about infection, bleeding, hyperglycemia damage to structures among others. Patient expresses understanding and agreement Time-out conducted.   Noted no overlying erythema, induration,  or other signs of local infection.   Skin prepped in a sterile fashion.   Local anesthesia: Topical Ethyl chloride.   With sterile technique and under real time ultrasound guidance: 40 mg of Kenalog and 2 mL of Marcaine injected into knee joint. Fluid seen entering the joint capsule.   Completed without difficulty   Pain immediately resolved suggesting accurate placement of the medication.   Advised to call if fevers/chills, erythema, induration, drainage, or persistent bleeding.   Images permanently stored and available for review in the ultrasound unit.  Impression: Technically successful ultrasound guided injection.  Quick look contralateral right knee shows a small Baker's cyst.   X-ray images bilateral knees obtained today personally and independently interpreted  Left knee: Moderate DJD medial compartment.  Mild DJD patellofemoral compartment.  No acute fractures are visible.  Right knee: Moderate DJD lateral compartment.  Mild patellofemoral compartment DJD present.  No acute fractures are visible.  Await formal radiology review   Assessment and Plan: 76 y.o. male with bilateral knee pain left worse than right.  Patient has DJD medial compartment left knee and lateral compartment right knee which is probably his underlying problem.  He may have a component of degenerative meniscus tear as well.  Last year he did very well with single dose gel shot injection left knee.  Today we will proceed with steroid injection left knee and work on authorization for single dose gel shots for both knees if possible.  He will let us know when and if his pain returns and we can get ready to do gel injection then.   PDMP not reviewed this encounter. Orders Placed This Encounter  Procedures  [place] LIMITED JOINT SPACE STRUCTURES LOW BILAT(NO LINKED CHARGES)    Order Specific Question:   Reason for Exam (SYMPTOM  OR DIAGNOSIS REQUIRED)    Answer:   bilat knee pain    Order Specific Question:    Preferred imaging location?    Answer:   [facility] Knee AP/LAT W/Sunrise Right    Standing Status:   Future    Number of Occurrences:   1    Standing Expiration Date:   11/12/2022    Order Specific Question:   Reason for Exam (SYMPTOM  OR DIAGNOSIS REQUIRED)    Answer:   bilateral knee pain    Order Specific Question:   Preferred imaging location?    Answer:   [facility] Knee AP/LAT W/Sunrise Left    Standing Status:   Future    Number of Occurrences:   1    Standing Expiration Date:   11/12/2022    Order Specific Question:   Reason for Exam (SYMPTOM  OR DIAGNOSIS REQUIRED)    Answer:   bilateral knee pain    Order Specific Question:   Preferred imaging location?    Answer:   [facility]   No orders of the defined types were placed in this encounter.    Discussed warning signs or symptoms. Please see discharge instructions. Patient expresses understanding.   The above documentation has been reviewed and is accurate and complete Patrick Kidd, M.D.

## 2022-10-13 NOTE — Therapy (Signed)
OUTPATIENT PHYSICAL THERAPY THORACOLUMBAR TREATMENT   Patient Name: Patrick Kidd MRN: [id number] DOB:1946/01/20, 77 y.o., male Today's Date: 10/13/2022  END OF SESSION:  PT End of Session - 09/25/22 1403     Visit Number 11    Number of Visits 19   Date for PT Re-Evaluation 11/06/22    Authorization Type MCR A & B    PT Start Time 1349    PT Stop Time 1437    PT Time Calculation (min) 48 min    Activity Tolerance Patient tolerated treatment well    Behavior During Therapy WFL for tasks assessed/performed                    Past Medical History:  Diagnosis Date   Acrophobia    Alcoholic cirrhosis (HCC) 10/04/2015   Anemia    Arthritis    foot by big toe   BPH associated with nocturia    Cataract    removed both eyes   Cholecystitis 05/2022   tx Abx   Chronic cough    PMH of   CLL (chronic lymphocytic leukemia) (HCC)    COVID-19 12/2018   Diabetes mellitus without complication (HCC)    Diverticulosis 07/03/2010   Colonoscopy.    Duodenal ulcer 2017   Fallen arches    Bilateral   GERD (gastroesophageal reflux disease)    Gout    Granuloma annulare    Hx of adenomatous colonic polyps multiple   Hydrocele 2011   Large septated right hydrocele   Liver cyst    Liver lesion    Nonspecific elevation of levels of transaminase or lactic acid dehydrogenase (LDH)    Obesity    Peripheral neuropathy    Plantar fasciitis    PMH of   Portal hypertension (HCC) 2017   Prostate cancer (HCC)    Right shoulder pain 11/2017   Sleep apnea    no cpap, patient denies   Thiamine deficiency    Wears reading eyeglasses    WPW (Wolff-Parkinson-White syndrome) 02/05/2019   Past Surgical History:  Procedure Laterality Date   BACK SURGERY  04/28/2022   CATARACT EXTRACTION, BILATERAL  12/2011   Dr Nile Riggs   COLONOSCOPY  2017   COLONOSCOPY W/ POLYPECTOMY  07/03/2010   2 adenomas, diverticulosis on right. Dr Leone Payor   CYSTOSCOPY N/A 03/15/2018   Procedure:  Derinda Late;  Surgeon: Crista Elliot, MD;  Location: [facility];  Service: Urology;  Laterality: N/A;  NO SEEDS FOUND IN BLADDER   ESOPHAGOGASTRODUODENOSCOPY (EGD) WITH PROPOFOL N/A 01/08/2016   Procedure: ESOPHAGOGASTRODUODENOSCOPY (EGD) WITH PROPOFOL;  Surgeon: Meryl Dare, MD;  Location: [facility];  Service: Endoscopy;  Laterality: N/A;   FLEXIBLE SIGMOIDOSCOPY  2000   RADIOACTIVE SEED IMPLANT N/A 03/15/2018   Procedure: RADIOACTIVE SEED IMPLANT/BRACHYTHERAPY IMPLANT;  Surgeon: Crista Elliot, MD;  Location: [facility];  Service: Urology;  Laterality: N/A;   73 SEEDS IMPLANTED   SIGMOIDOSCOPY     SPACE OAR INSTILLATION N/A 03/15/2018   Procedure: SPACE OAR INSTILLATION;  Surgeon: Crista Elliot, MD;  Location: [facility];  Service: Urology;  Laterality: N/A;   WISDOM TOOTH EXTRACTION     Patient Active Problem List   Diagnosis Date Noted   E coli bacteremia 06/01/2022   Sludge in gallbladder 05/30/2022   Hyperbilirubinemia 05/30/2022   Urinary incontinence 06/25/2021   Degenerative lumbar spinal stenosis 04/30/2021   Degenerative arthritis of knee, bilateral 03/27/2021  Chronic lymphocytic leukemia (HCC) 11/08/2020   Insulin-requiring or dependent type II diabetes mellitus (HCC) 09/25/2020   Stage 3a chronic kidney disease (HCC) 03/15/2020   Thiamine deficiency    Paroxysmal atrial fibrillation (HCC) 07/19/2019   Allergic rhinitis 06/09/2019   Erectile dysfunction due to diabetes mellitus (HCC) 06/09/2019   Overweight with body mass index (BMI) of 28 to 28.9 in adult 03/17/2019   WPW (Wolff-Parkinson-White syndrome) 02/05/2019   SBP (spontaneous bacterial peritonitis) (HCC)-December 2020,     Encephalopathy, hepatic (HCC)    Degenerative disc disease, cervical 07/21/2018   Cervical radiculopathy 07/16/2018   OAB (overactive bladder) 07/06/2018   Malignant neoplasm of prostate (HCC) 01/13/2018    Hyperlipidemia LDL goal <100 11/11/2017   Essential hypertension 11/11/2017   BPH associated with nocturia 11/11/2017   Erectile dysfunction due to arterial insufficiency 04/07/2017   Peripheral vascular disease (HCC) 06/25/2016   Alcoholic cirrhosis (HCC) 10/04/2015   Type 2 diabetes mellitus with complication, with long-term current use of insulin (HCC) 08/22/2015   Hypersomnolence 03/19/2014   Peripheral neuropathy 01/11/2013   Polyclonal gammopathy 01/11/2013   GERD 10/04/2009     REFERRING PROVIDER: Julio Sicks, MD   REFERRING DIAG: M54.16 (ICD-10-CM) - Radiculopathy, lumbar region  Rationale for Evaluation and Treatment: Rehabilitation  THERAPY DIAG:  No diagnosis found.  ONSET DATE: DOS 04/28/2022  SUBJECTIVE:                                                                                                                                                                                           SUBJECTIVE STATEMENT:  Pt reports he has seen a major improvement since he started PT though has recently noticed a plateau.  Pt states his time is limited today because he is having bilat knee injections.  Pt went to his grandson's baseball games this weekend.  He had to walk up/down hills to the games.  He denies pain and had no falls though was sore at 6:30 that evening.  Pt reports he has been his HEP on/off.     PERTINENT HISTORY:  Lumbar decompression surgery on 04/27/2022 Lumbar MRI in 2023 showed mild retrolisthesis L1-2, mild anterolisthesis L3-4. And mild retrolisthesis L4-5. DM type 2 and Peripheral neuropathy CLL (chronic lymphocytic leukemia) Arthritis (X ray findings of OA in R hip and L knee) Prostate CA with radiation Alcoholic Cirrhosis WPW (Wolff-Parkinson-White syndrome), Pt states it doesn't affect him  PAIN:  Are you having pain? No NPRS: 1-2/10 current / 0/10 Location: central lower lumbar   PRECAUTIONS: Other: per surgery ; mild retrolisthesis and  anterolisthesis ; CLL and peripheral neuropathy ; Pt reports having balance  deficits  WEIGHT BEARING RESTRICTIONS: none indicated on MD note and pt reports none also  FALLS:  Has patient fallen in last 6 months? No  LIVING ENVIRONMENT: Lives with: lives with their spouse Lives in: 1 story home Stairs: 1 step with 1 rail Has following equipment at home: cane  OCCUPATION: part time work.  Driving.  Pt does have to ambulate on uneven terrain.    PLOF: Independent.  Pt was able to ambulate 1.5 miles prior to increased lumbar and LE pain.  PATIENT GOALS: to be able to perform stairs   OBJECTIVE:   LOWER EXTREMITY MMT:     MMT Right eval Left eval R 8/22 L 8/22  Hip flexion 4+/5 5/5 5/5 5/5  Hip extension        Hip abduction 33.0 36.3 39.0 39.3  Hip adduction        Hip internal rotation        Hip external rotation        Knee flexion 4/5 seated 5/5 / seated with knee pain 5/5 5/5  Knee extension 5/5 4+/5 4/5 5/5  Ankle dorsiflexion 5/5 5/5    Ankle plantarflexion WFL seated WFL seated    Ankle inversion        Ankle eversion         (Blank rows = not tested)     FUNCTIONAL TESTS:  5x STS test: eval: 28.9 without UE support   8/22: 11 seconds without UE support    DIAGNOSTIC FINDINGS:  Lumbar MRI in 2023: -Alignment: Mild retrolisthesis L1-2. Mild anterolisthesis L3-4. Mild retrolisthesis L4-5. IMPRESSION: -Motion degraded study  -Negative for fracture or metastatic disease  -Lumbar spondylosis. Multilevel spinal and foraminal stenosis  X ray of R hip in 2023:  IMPRESSION: Mild osteoarthritis of the right hip.  X rays of L knee in 2023: IMPRESSION: Mild-to-moderate medial and patellofemoral osteoarthritis.  Chronic spurring at the patellar tendon insertion on the tibial tubercle.   TODAY'S TREATMENT:                                                                                                                                     Nustep L5 bilat  Ues/LE's x 5 mins Hurdles x4 fwd and lateral x4 laps each Side stepping with GTB at ankles x2 laps with TrA Step up on airex x10ea Lateral step up/over airex x10 Heel/toe raises x20 Standing on airex with FT 2x30 sec  Staggered stance on airex x 30 sec each Staggered STS x10ea         PATIENT EDUCATION:  Education details:  POC, rationale of interventions, exercise form, HEP, and relevant anatomy.  Person educated: Patient Education method: Explanation, demonstration, verbal cues, handout Education comprehension: verbalized understanding, returned demonstration, verbal cues required  HOME EXERCISE PROGRAM: Access Code: [email or web address] URL: [email or web address] Date: 08/11/2022 Prepared by: Riki Altes  Exercises - Hooklying Hamstring Stretch with Strap  - 1 x  daily - 7 x weekly - 3 sets - 30 seconds hold - Supine Active Straight Leg Raise  - 1 x daily - 7 x weekly - 3 sets - 10 reps - Sidelying Hip Abduction  - 1 x daily - 7 x weekly - 3 sets - 10 reps - Sit to Stand Without Arm Support  - 1 x daily - 7 x weekly - 3 sets - 10 reps  Updated HEP: - Supine Transversus Abdominis Bracing - Hands on Stomach  - 2 x daily - 7 x weekly - 2 sets - 10 reps - Clamshell  - 1 x daily - 5-6 x weekly - 2 sets - 10 reps  ASSESSMENT:  CLINICAL IMPRESSION: Pt is challenged by golfer's lift as well as airex step ups due to instability. Improved ability with hurdles, though does require cues for full foot clearance in fwd direction. He had increased fatigue in knees today than usual. Will continue to monitor this and progress as tolerated.   OBJECTIVE IMPAIRMENTS: Abnormal gait, decreased activity tolerance, decreased endurance, decreased mobility, difficulty walking, decreased strength, and pain.   ACTIVITY LIMITATIONS: sitting, stairs, and locomotion level  PARTICIPATION LIMITATIONS: shopping and community activity  PERSONAL FACTORS: 3+ comorbidities: CLL, DM Type 2, arthritis, and  peripheral neuropathy. Pt reports having balance deficits  are also affecting patient's functional outcome.   REHAB POTENTIAL: Good  CLINICAL DECISION MAKING: Stable/uncomplicated  EVALUATION COMPLEXITY: Low   GOALS:  SHORT TERM GOALS: Target date: 08/27/2022   Pt will be independent and compliant with HEP for improved pain, strength, ROM, and function.   Baseline: Goal status: MET 7/22  2.  Pt will report at least a 25% improvement in his mobility overall.  Baseline:  Goal status: GOAL MET  3.  Pt will be able to perform a 6 inch step up with good form and good control. Baseline:  Goal status: MET 8/22 Target date:  09/03/2022  4.  Pt will report improved balance with his daily mobility and daily activities.  Baseline:  Goal status: IN PROGRESS (has difficulty with turns- 8/22) Target date:  10/23/2022     LONG TERM GOALS: Target date: 10/01/2022  Pt will demo improved 5x STS test to no > 16 sec for improved functional LE strength and improved performance of transfers.  Baseline:  Goal status: MET 8/22  2.  Pt will demo improved R knee extension strength to 5/5 MMT, R hip flex and knee flexion (seated) strength to 5/5 MMT and bilat hip abd strength by at least 5#'s (HHD) for improved tolerance with and performance of functional mobility.   Baseline:   Goal status: IN PROGRESS 8/22.  PT modified goal Target date:  11/06/2022  3.  Pt will report he is able to perform extended community ambulation distance without significant difficulty and without significant pain.   Baseline:  Goal status: MET (tires after ~ 1/2 mile due to CLL- knees/back do not bother him 8/22)  4.  Pt will be able to perform stairs with a reciprocal gait with rail without significant difficulty and with good control.  Baseline:  Goal status: MET 8/22. Needs railing.  5.  Pt will demo improved balance and proprioception as evidenced by performance of proprioceptive exercises with improved control  and stability in order for improved performance of functional mobility including stairs with increased confidence and stability.    Goal status: INITIAL Target date:  11/06/2022    PLAN:  PT FREQUENCY: 1-2x/week  PT DURATION: 4 -  6 weeks   PLANNED INTERVENTIONS: Therapeutic exercises, Therapeutic activity, Neuromuscular re-education, Balance training, Gait training, Patient/Family education, Self Care, Joint mobilization, Stair training, DME instructions, Aquatic Therapy, Cryotherapy, scar mobilization, Taping, Manual therapy, and Re-evaluation.  PLAN FOR NEXT SESSION: Cont with core and LE strengthening.  Balance exercises including standing with FT and airex exercises.  Review and perform supine TrA contractions.      Riki Altes, PTA  10/13/22 12:05 PM

## 2022-10-14 ENCOUNTER — Encounter (HOSPITAL_BASED_OUTPATIENT_CLINIC_OR_DEPARTMENT_OTHER): Payer: Self-pay | Admitting: Physical Therapy

## 2022-10-14 NOTE — Telephone Encounter (Signed)
Pending

## 2022-10-15 NOTE — Telephone Encounter (Signed)
VOB initiated for Durolane

## 2022-10-15 NOTE — Telephone Encounter (Signed)
Durolane for BILAT knee OA  Primary Insurance: Medicare Co-pay: n/a Co-insurance: 20% Deductible: $240 of $240 met Prior Auth NOT required  Secondary Insurance: [employer] [place] Medicare Supplement plan F Co-pay: n/a Co-insurance: covers 20% Medicare co-insurance Deductible: covers Medicare deductible of which $240 of $2400 has been met Prior Auth NOT required   Knee Injection History: 03/27/21 - Kenalog BILAT 06/06/21 - Monovisc LEFT 10/13/22 - Kenalog BILAT

## 2022-10-15 NOTE — Telephone Encounter (Signed)
NO Prior Auth required for [facility]

## 2022-10-15 NOTE — Telephone Encounter (Signed)
NO Prior Auth required

## 2022-10-16 ENCOUNTER — Ambulatory Visit (HOSPITAL_BASED_OUTPATIENT_CLINIC_OR_DEPARTMENT_OTHER): Payer: Medicare Other | Admitting: Physical Therapy

## 2022-10-16 NOTE — Telephone Encounter (Signed)
Scheduled

## 2022-10-21 ENCOUNTER — Ambulatory Visit (INDEPENDENT_AMBULATORY_CARE_PROVIDER_SITE_OTHER): Payer: Medicare Other | Admitting: Family Medicine

## 2022-10-21 ENCOUNTER — Other Ambulatory Visit: Payer: Self-pay

## 2022-10-21 ENCOUNTER — Telehealth: Payer: Self-pay | Admitting: Internal Medicine

## 2022-10-21 ENCOUNTER — Encounter: Payer: Self-pay | Admitting: Family Medicine

## 2022-10-21 DIAGNOSIS — M25562 Pain in left knee: Secondary | ICD-10-CM

## 2022-10-21 DIAGNOSIS — E118 Type 2 diabetes mellitus with unspecified complications: Secondary | ICD-10-CM

## 2022-10-21 DIAGNOSIS — M170 Bilateral primary osteoarthritis of knee: Secondary | ICD-10-CM | POA: Diagnosis not present

## 2022-10-21 DIAGNOSIS — M25561 Pain in right knee: Secondary | ICD-10-CM | POA: Diagnosis not present

## 2022-10-21 DIAGNOSIS — G8929 Other chronic pain: Secondary | ICD-10-CM | POA: Diagnosis not present

## 2022-10-21 DIAGNOSIS — Z794 Long term (current) use of insulin: Secondary | ICD-10-CM | POA: Diagnosis not present

## 2022-10-21 LAB — HEMOGLOBIN A1C: Hgb A1c MFr Bld: 7.4 % — ABNORMAL HIGH (ref 4.6–6.5)

## 2022-10-21 MED ORDER — SODIUM HYALURONATE 60 MG/3ML IX PRSY
60.0000 mg | PREFILLED_SYRINGE | Freq: Once | INTRA_ARTICULAR | Status: AC
Start: 2022-10-21 — End: 2022-10-21
  Administered 2022-10-21: 60 mg via INTRA_ARTICULAR

## 2022-10-21 NOTE — Progress Notes (Signed)
A1c is a little bit elevated.  I will forward this to your primary care provider.

## 2022-10-21 NOTE — Telephone Encounter (Signed)
Please contact patient and arrange a follow-up visit with me next available follow-up slot (December or January is okay).

## 2022-10-21 NOTE — Progress Notes (Signed)
Patrick Kidd presents to clinic today for Durolane injection bilateral knee 1/1 Procedure: Real-time Ultrasound Guided Injection of right knee joint superior lateral patellar space Device: Philips Affiniti 50G/GE Logiq Images permanently stored and available for review in PACS Verbal informed consent obtained.  Discussed risks and benefits of procedure. Warned about infection, bleeding, damage to structures among others. Patient expresses understanding and agreement Time-out conducted.   Noted no overlying erythema, induration, or other signs of local infection.   Skin prepped in a sterile fashion.   Local anesthesia: Topical Ethyl chloride.   With sterile technique and under real time ultrasound guidance: Durolane 60 mg injected into knee joint. Fluid seen entering the joint capsule.   Completed without difficulty   Advised to call if fevers/chills, erythema, induration, drainage, or persistent bleeding.   Images permanently stored and available for review in the ultrasound unit.  Impression: Technically successful ultrasound guided injection.  Procedure: Real-time Ultrasound Guided Injection of left knee joint superior lateral patellar space Device: Philips Affiniti 50G/GE Logiq Images permanently stored and available for review in PACS Verbal informed consent obtained.  Discussed risks and benefits of procedure. Warned about infection, bleeding, damage to structures among others. Patient expresses understanding and agreement Time-out conducted.   Noted no overlying erythema, induration, or other signs of local infection.   Skin prepped in a sterile fashion.   Local anesthesia: Topical Ethyl chloride.   With sterile technique and under real time ultrasound guidance: Durolane 60 mg injected into knee joint. Fluid seen entering the joint capsule.   Completed without difficulty   Advised to call if fevers/chills, erythema, induration, drainage, or persistent bleeding.   Images  permanently stored and available for review in the ultrasound unit.  Impression: Technically successful ultrasound guided injection. Lot number: [id number] for both injections  Additionally Patrick Kidd notes that he thinks he is due for his A1c.  I can order the A1c and for the results to his PCP Dr. Yetta Barre in my building upstairs.

## 2022-10-21 NOTE — Patient Instructions (Signed)
Thank you for coming in today.   You received an injection today. Seek immediate medical attention if the joint becomes red, extremely painful, or is oozing fluid.  

## 2022-10-23 ENCOUNTER — Ambulatory Visit (HOSPITAL_BASED_OUTPATIENT_CLINIC_OR_DEPARTMENT_OTHER): Payer: Medicare Other | Admitting: Physical Therapy

## 2022-10-23 DIAGNOSIS — M6281 Muscle weakness (generalized): Secondary | ICD-10-CM

## 2022-10-23 DIAGNOSIS — M5416 Radiculopathy, lumbar region: Secondary | ICD-10-CM | POA: Diagnosis not present

## 2022-10-23 DIAGNOSIS — R262 Difficulty in walking, not elsewhere classified: Secondary | ICD-10-CM | POA: Diagnosis not present

## 2022-10-23 NOTE — Telephone Encounter (Addendum)
Pt received Durolane for BILAT knee OA on 10/21/22.  Can consider repeat injections on or after 04/21/23.

## 2022-10-23 NOTE — Therapy (Signed)
OUTPATIENT PHYSICAL THERAPY THORACOLUMBAR TREATMENT   Patient Name: Patrick Kidd MRN: [id number] DOB:10-15-1945, 77 y.o., male Today's Date: 10/24/2022  END OF SESSION: PT End of Session -  10/23/22    Visit Number 14   Number of Visits 19   Date for PT Re-Evaluation 11/06/2022   Authorization Type MCR A & B   PT Start Time  1404   PT Stop Time 1438   PT Time Calculation (min) 34 min   Activity Tolerance Patient tolerated treatment well   Behavior During Therapy  WFL for tasks assessed/performed           Past Medical History:  Diagnosis Date   Acrophobia    Alcoholic cirrhosis (HCC) 10/04/2015   Anemia    Arthritis    foot by big toe   BPH associated with nocturia    Cataract    removed both eyes   Cholecystitis 05/2022   tx Abx   Chronic cough    PMH of   CLL (chronic lymphocytic leukemia) (HCC)    COVID-19 12/2018   Diabetes mellitus without complication (HCC)    Diverticulosis 07/03/2010   Colonoscopy.    Duodenal ulcer 2017   Fallen arches    Bilateral   GERD (gastroesophageal reflux disease)    Gout    Granuloma annulare    Hx of adenomatous colonic polyps multiple   Hydrocele 2011   Large septated right hydrocele   Liver cyst    Liver lesion    Nonspecific elevation of levels of transaminase or lactic acid dehydrogenase (LDH)    Obesity    Peripheral neuropathy    Plantar fasciitis    PMH of   Portal hypertension (HCC) 2017   Prostate cancer (HCC)    Right shoulder pain 11/2017   Sleep apnea    no cpap, patient denies   Thiamine deficiency    Wears reading eyeglasses    WPW (Wolff-Parkinson-White syndrome) 02/05/2019   Past Surgical History:  Procedure Laterality Date   BACK SURGERY  04/28/2022   CATARACT EXTRACTION, BILATERAL  12/2011   Dr Nile Riggs   COLONOSCOPY  2017   COLONOSCOPY W/ POLYPECTOMY  07/03/2010   2 adenomas, diverticulosis on right. Dr Leone Payor   CYSTOSCOPY N/A 03/15/2018   Procedure: Derinda Late;   Surgeon: Crista Elliot, MD;  Location: [facility];  Service: Urology;  Laterality: N/A;  NO SEEDS FOUND IN BLADDER   ESOPHAGOGASTRODUODENOSCOPY (EGD) WITH PROPOFOL N/A 01/08/2016   Procedure: ESOPHAGOGASTRODUODENOSCOPY (EGD) WITH PROPOFOL;  Surgeon: Meryl Dare, MD;  Location: [facility];  Service: Endoscopy;  Laterality: N/A;   FLEXIBLE SIGMOIDOSCOPY  2000   RADIOACTIVE SEED IMPLANT N/A 03/15/2018   Procedure: RADIOACTIVE SEED IMPLANT/BRACHYTHERAPY IMPLANT;  Surgeon: Crista Elliot, MD;  Location: [facility];  Service: Urology;  Laterality: N/A;   73 SEEDS IMPLANTED   SIGMOIDOSCOPY     SPACE OAR INSTILLATION N/A 03/15/2018   Procedure: SPACE OAR INSTILLATION;  Surgeon: Crista Elliot, MD;  Location: [facility];  Service: Urology;  Laterality: N/A;   WISDOM TOOTH EXTRACTION     Patient Active Problem List   Diagnosis Date Noted   E coli bacteremia 06/01/2022   Sludge in gallbladder 05/30/2022   Hyperbilirubinemia 05/30/2022   Urinary incontinence 06/25/2021   Degenerative lumbar spinal stenosis 04/30/2021   Degenerative arthritis of knee, bilateral 03/27/2021   Chronic lymphocytic leukemia (HCC) 11/08/2020   Insulin-requiring or dependent type II diabetes mellitus (HCC)  09/25/2020   Stage 3a chronic kidney disease (HCC) 03/15/2020   Thiamine deficiency    Paroxysmal atrial fibrillation (HCC) 07/19/2019   Allergic rhinitis 06/09/2019   Erectile dysfunction due to diabetes mellitus (HCC) 06/09/2019   Overweight with body mass index (BMI) of 28 to 28.9 in adult 03/17/2019   WPW (Wolff-Parkinson-White syndrome) 02/05/2019   SBP (spontaneous bacterial peritonitis) (HCC)-December 2020,     Encephalopathy, hepatic (HCC)    Degenerative disc disease, cervical 07/21/2018   Cervical radiculopathy 07/16/2018   OAB (overactive bladder) 07/06/2018   Malignant neoplasm of prostate (HCC) 01/13/2018   Hyperlipidemia LDL goal <100  11/11/2017   Essential hypertension 11/11/2017   BPH associated with nocturia 11/11/2017   Erectile dysfunction due to arterial insufficiency 04/07/2017   Peripheral vascular disease (HCC) 06/25/2016   Alcoholic cirrhosis (HCC) 10/04/2015   Type 2 diabetes mellitus with complication, with long-term current use of insulin (HCC) 08/22/2015   Hypersomnolence 03/19/2014   Peripheral neuropathy 01/11/2013   Polyclonal gammopathy 01/11/2013   GERD 10/04/2009     REFERRING PROVIDER: Julio Sicks, MD   REFERRING DIAG: M54.16 (ICD-10-CM) - Radiculopathy, lumbar region  Rationale for Evaluation and Treatment: Rehabilitation  THERAPY DIAG:  Radiculopathy, lumbar region  Muscle weakness (generalized)  Difficulty in walking, not elsewhere classified  ONSET DATE: DOS 04/28/2022  SUBJECTIVE:                                                                                                                                                                                           SUBJECTIVE STATEMENT:  Pt received gel injections in bilat knees on Monday and states his knees are already feeling better.  He denies any restrictions from MD.     PERTINENT HISTORY:  Lumbar decompression surgery on 04/27/2022 Lumbar MRI in 2023 showed mild retrolisthesis L1-2, mild anterolisthesis L3-4. And mild retrolisthesis L4-5. DM type 2 and Peripheral neuropathy CLL (chronic lymphocytic leukemia) Arthritis (X ray findings of OA in R hip and L knee) Prostate CA with radiation Alcoholic Cirrhosis WPW (Wolff-Parkinson-White syndrome), Pt states it doesn't affect him  PAIN:  Are you having pain? No NPRS: 1-2/10 current / 0/10 Location: central lower lumbar   PRECAUTIONS: Other: per surgery ; mild retrolisthesis and anterolisthesis ; CLL and peripheral neuropathy ; Pt reports having balance deficits  WEIGHT BEARING RESTRICTIONS: none indicated on MD note and pt reports none also  FALLS:  Has patient  fallen in last 6 months? No  LIVING ENVIRONMENT: Lives with: lives with their spouse Lives in: 1 story home Stairs: 1 step with 1 rail Has following equipment at home: cane  OCCUPATION: part time work.  Driving.  Pt does have to ambulate on uneven terrain.    PLOF: Independent.  Pt was able to ambulate 1.5 miles prior to increased lumbar and LE pain.  PATIENT GOALS: to be able to perform stairs   OBJECTIVE:   DIAGNOSTIC FINDINGS:  Lumbar MRI in 2023: -Alignment: Mild retrolisthesis L1-2. Mild anterolisthesis L3-4. Mild retrolisthesis L4-5. IMPRESSION: -Motion degraded study  -Negative for fracture or metastatic disease  -Lumbar spondylosis. Multilevel spinal and foraminal stenosis  X ray of R hip in 2023:  IMPRESSION: Mild osteoarthritis of the right hip.  X rays of L knee in 2023: IMPRESSION: Mild-to-moderate medial and patellofemoral osteoarthritis.  Chronic spurring at the patellar tendon insertion on the tibial tubercle.   TODAY'S TREATMENT:                                                                                                                                    Nustep L5 bilat Ues/LE's x 5 mins Hurdles x4 fwd with rail Side stepping with GTB at ankles 2x10 at wall with TrA Heel/toe raises x20 Standing on airex with FT 2x30 sec  Staggered stance on airex 2  x 30 sec each  S/L clams with TrA RTB 2x10 bilat Supine alt UE/LE 2x10 with TrA          PATIENT EDUCATION:  Education details:  POC, rationale of interventions, exercise form, HEP, and relevant anatomy.  Person educated: Patient Education method: Explanation, demonstration, verbal cues, handout Education comprehension: verbalized understanding, returned demonstration, verbal cues required  HOME EXERCISE PROGRAM: Access Code: [email or web address] URL: [email or web address] Date: 08/11/2022 Prepared by: Riki Altes  Exercises - Hooklying Hamstring Stretch with Strap  - 1 x daily - 7 x  weekly - 3 sets - 30 seconds hold - Supine Active Straight Leg Raise  - 1 x daily - 7 x weekly - 3 sets - 10 reps - Sidelying Hip Abduction  - 1 x daily - 7 x weekly - 3 sets - 10 reps - Sit to Stand Without Arm Support  - 1 x daily - 7 x weekly - 3 sets - 10 reps - Supine Transversus Abdominis Bracing - Hands on Stomach  - 2 x daily - 7 x weekly - 2 sets - 10 reps - Clamshell  - 1 x daily - 5-6 x weekly - 2 sets - 10 reps  ASSESSMENT:  CLINICAL IMPRESSION: Pt received gel injections earlier this week and reports improved knee sx's.  PT decreased intensity of exercises today including decreasing Wb'ing exercises due to recent gel injections.  He performed exercises well without c/o's.  Pt requires UE support on rail with hurdle walking.  He responded well to Rx reporting soreness in bilat knees and imrpoved lumbar pain to 0/10 after Rx.  OBJECTIVE IMPAIRMENTS: Abnormal gait, decreased activity tolerance, decreased endurance, decreased mobility, difficulty walking, decreased strength, and pain.   ACTIVITY LIMITATIONS: sitting, stairs, and locomotion level  PARTICIPATION LIMITATIONS: shopping and community activity  PERSONAL FACTORS: 3+ comorbidities: CLL, DM Type 2, arthritis, and peripheral neuropathy. Pt reports having balance deficits  are also affecting patient's functional outcome.   REHAB POTENTIAL: Good  CLINICAL DECISION MAKING: Stable/uncomplicated  EVALUATION COMPLEXITY: Low   GOALS:  SHORT TERM GOALS: Target date: 08/27/2022   Pt will be independent and compliant with HEP for improved pain, strength, ROM, and function.   Baseline: Goal status: MET 7/22  2.  Pt will report at least a 25% improvement in his mobility overall.  Baseline:  Goal status: GOAL MET  3.  Pt will be able to perform a 6 inch step up with good form and good control. Baseline:  Goal status: MET 8/22 Target date:  09/03/2022  4.  Pt will report improved balance with his daily mobility and daily  activities.  Baseline:  Goal status: IN PROGRESS (has difficulty with turns- 8/22) Target date:  10/23/2022     LONG TERM GOALS: Target date: 10/01/2022  Pt will demo improved 5x STS test to no > 16 sec for improved functional LE strength and improved performance of transfers.  Baseline:  Goal status: MET 8/22  2.  Pt will demo improved R knee extension strength to 5/5 MMT, R hip flex and knee flexion (seated) strength to 5/5 MMT and bilat hip abd strength by at least 5#'s (HHD) for improved tolerance with and performance of functional mobility.   Baseline:   Goal status: IN PROGRESS 8/22.  PT modified goal Target date:  11/06/2022  3.  Pt will report he is able to perform extended community ambulation distance without significant difficulty and without significant pain.   Baseline:  Goal status: MET (tires after ~ 1/2 mile due to CLL- knees/back do not bother him 8/22)  4.  Pt will be able to perform stairs with a reciprocal gait with rail without significant difficulty and with good control.  Baseline:  Goal status: MET 8/22. Needs railing.  5.  Pt will demo improved balance and proprioception as evidenced by performance of proprioceptive exercises with improved control and stability in order for improved performance of functional mobility including stairs with increased confidence and stability.    Goal status: INITIAL Target date:  11/06/2022    PLAN:  PT FREQUENCY: 1-2x/week  PT DURATION: 4 - 6 weeks   PLANNED INTERVENTIONS: Therapeutic exercises, Therapeutic activity, Neuromuscular re-education, Balance training, Gait training, Patient/Family education, Self Care, Joint mobilization, Stair training, DME instructions, Aquatic Therapy, Cryotherapy, scar mobilization, Taping, Manual therapy, and Re-evaluation.  PLAN FOR NEXT SESSION: Cont with core and LE strengthening and balance exercises.       Audie Clear III PT, DPT 10/24/22 12:30 PM

## 2022-10-24 ENCOUNTER — Other Ambulatory Visit: Payer: Self-pay | Admitting: Internal Medicine

## 2022-10-24 ENCOUNTER — Encounter (HOSPITAL_BASED_OUTPATIENT_CLINIC_OR_DEPARTMENT_OTHER): Payer: Self-pay | Admitting: Physical Therapy

## 2022-10-24 DIAGNOSIS — K219 Gastro-esophageal reflux disease without esophagitis: Secondary | ICD-10-CM

## 2022-10-24 DIAGNOSIS — R053 Chronic cough: Secondary | ICD-10-CM

## 2022-10-24 NOTE — Progress Notes (Signed)
Left knee x-ray shows arthritis.

## 2022-10-24 NOTE — Progress Notes (Signed)
Right knee x-ray shows some arthritis especially underneath the kneecap.

## 2022-10-27 ENCOUNTER — Ambulatory Visit (HOSPITAL_BASED_OUTPATIENT_CLINIC_OR_DEPARTMENT_OTHER): Payer: Medicare Other | Admitting: Physical Therapy

## 2022-10-27 DIAGNOSIS — M5416 Radiculopathy, lumbar region: Secondary | ICD-10-CM | POA: Diagnosis not present

## 2022-10-27 DIAGNOSIS — M6281 Muscle weakness (generalized): Secondary | ICD-10-CM | POA: Diagnosis not present

## 2022-10-27 DIAGNOSIS — R262 Difficulty in walking, not elsewhere classified: Secondary | ICD-10-CM | POA: Diagnosis not present

## 2022-10-27 NOTE — Therapy (Signed)
OUTPATIENT PHYSICAL THERAPY THORACOLUMBAR TREATMENT   Patient Name: Patrick Kidd MRN: [id number] DOB:April 21, 1945, 77 y.o., male Today's Date: 10/28/2022  END OF SESSION: PT End of Session -  10/27/22    Visit Number 15   Number of Visits 19   Date for PT Re-Evaluation 11/06/2022   Authorization Type MCR A & B   PT Start Time 1158   PT Stop Time 1236   PT Time Calculation (min) 38 min   Activity Tolerance Patient tolerated treatment well   Behavior During Therapy  WFL for tasks assessed/performed           Past Medical History:  Diagnosis Date   Acrophobia    Alcoholic cirrhosis (HCC) 10/04/2015   Anemia    Arthritis    foot by big toe   BPH associated with nocturia    Cataract    removed both eyes   Cholecystitis 05/2022   tx Abx   Chronic cough    PMH of   CLL (chronic lymphocytic leukemia) (HCC)    COVID-19 12/2018   Diabetes mellitus without complication (HCC)    Diverticulosis 07/03/2010   Colonoscopy.    Duodenal ulcer 2017   Fallen arches    Bilateral   GERD (gastroesophageal reflux disease)    Gout    Granuloma annulare    Hx of adenomatous colonic polyps multiple   Hydrocele 2011   Large septated right hydrocele   Liver cyst    Liver lesion    Nonspecific elevation of levels of transaminase or lactic acid dehydrogenase (LDH)    Obesity    Peripheral neuropathy    Plantar fasciitis    PMH of   Portal hypertension (HCC) 2017   Prostate cancer (HCC)    Right shoulder pain 11/2017   Sleep apnea    no cpap, patient denies   Thiamine deficiency    Wears reading eyeglasses    WPW (Wolff-Parkinson-White syndrome) 02/05/2019   Past Surgical History:  Procedure Laterality Date   BACK SURGERY  04/28/2022   CATARACT EXTRACTION, BILATERAL  12/2011   Dr Nile Riggs   COLONOSCOPY  2017   COLONOSCOPY W/ POLYPECTOMY  07/03/2010   2 adenomas, diverticulosis on right. Dr Leone Payor   CYSTOSCOPY N/A 03/15/2018   Procedure: Derinda Late;   Surgeon: Crista Elliot, MD;  Location: [facility];  Service: Urology;  Laterality: N/A;  NO SEEDS FOUND IN BLADDER   ESOPHAGOGASTRODUODENOSCOPY (EGD) WITH PROPOFOL N/A 01/08/2016   Procedure: ESOPHAGOGASTRODUODENOSCOPY (EGD) WITH PROPOFOL;  Surgeon: Meryl Dare, MD;  Location: [facility];  Service: Endoscopy;  Laterality: N/A;   FLEXIBLE SIGMOIDOSCOPY  2000   RADIOACTIVE SEED IMPLANT N/A 03/15/2018   Procedure: RADIOACTIVE SEED IMPLANT/BRACHYTHERAPY IMPLANT;  Surgeon: Crista Elliot, MD;  Location: [facility];  Service: Urology;  Laterality: N/A;   73 SEEDS IMPLANTED   SIGMOIDOSCOPY     SPACE OAR INSTILLATION N/A 03/15/2018   Procedure: SPACE OAR INSTILLATION;  Surgeon: Crista Elliot, MD;  Location: [facility];  Service: Urology;  Laterality: N/A;   WISDOM TOOTH EXTRACTION     Patient Active Problem List   Diagnosis Date Noted   E coli bacteremia 06/01/2022   Sludge in gallbladder 05/30/2022   Hyperbilirubinemia 05/30/2022   Urinary incontinence 06/25/2021   Degenerative lumbar spinal stenosis 04/30/2021   Degenerative arthritis of knee, bilateral 03/27/2021   Chronic lymphocytic leukemia (HCC) 11/08/2020   Insulin-requiring or dependent type II diabetes mellitus (HCC) 09/25/2020  Stage 3a chronic kidney disease (HCC) 03/15/2020   Thiamine deficiency    Paroxysmal atrial fibrillation (HCC) 07/19/2019   Allergic rhinitis 06/09/2019   Erectile dysfunction due to diabetes mellitus (HCC) 06/09/2019   Overweight with body mass index (BMI) of 28 to 28.9 in adult 03/17/2019   WPW (Wolff-Parkinson-White syndrome) 02/05/2019   SBP (spontaneous bacterial peritonitis) (HCC)-December 2020,     Encephalopathy, hepatic (HCC)    Degenerative disc disease, cervical 07/21/2018   Cervical radiculopathy 07/16/2018   OAB (overactive bladder) 07/06/2018   Malignant neoplasm of prostate (HCC) 01/13/2018   Hyperlipidemia LDL goal <100  11/11/2017   Essential hypertension 11/11/2017   BPH associated with nocturia 11/11/2017   Erectile dysfunction due to arterial insufficiency 04/07/2017   Peripheral vascular disease (HCC) 06/25/2016   Alcoholic cirrhosis (HCC) 10/04/2015   Type 2 diabetes mellitus with complication, with long-term current use of insulin (HCC) 08/22/2015   Hypersomnolence 03/19/2014   Peripheral neuropathy 01/11/2013   Polyclonal gammopathy 01/11/2013   GERD 10/04/2009     REFERRING PROVIDER: Julio Sicks, MD   REFERRING DIAG: M54.16 (ICD-10-CM) - Radiculopathy, lumbar region  Rationale for Evaluation and Treatment: Rehabilitation  THERAPY DIAG:  Radiculopathy, lumbar region  Muscle weakness (generalized)  Difficulty in walking, not elsewhere classified  ONSET DATE: DOS 04/28/2022  SUBJECTIVE:                                                                                                                                                                                           SUBJECTIVE STATEMENT:  Pt states his knees were sore after prior Rx though he recovered quickly.  Pt reports his knees are feeling better since receiving gel injections.  He denies any restrictions from MD.  Pt walked in the pool twice this weekend which helped.    PERTINENT HISTORY:  Lumbar decompression surgery on 04/27/2022 Lumbar MRI in 2023 showed mild retrolisthesis L1-2, mild anterolisthesis L3-4. And mild retrolisthesis L4-5. DM type 2 and Peripheral neuropathy CLL (chronic lymphocytic leukemia) Arthritis (X ray findings of OA in R hip and L knee) Prostate CA with radiation Alcoholic Cirrhosis WPW (Wolff-Parkinson-White syndrome), Pt states it doesn't affect him  PAIN:  Are you having pain? No NPRS: 0/10 current / 0/10 Location: central lower lumbar / knees   PRECAUTIONS: Other: per surgery ; mild retrolisthesis and anterolisthesis ; CLL and peripheral neuropathy ; Pt reports having balance  deficits  WEIGHT BEARING RESTRICTIONS: none indicated on MD note and pt reports none also  FALLS:  Has patient fallen in last 6 months? No  LIVING ENVIRONMENT: Lives with: lives with their spouse Lives in: 1 story home  Stairs: 1 step with 1 rail Has following equipment at home: cane  OCCUPATION: part time work.  Driving.  Pt does have to ambulate on uneven terrain.    PLOF: Independent.  Pt was able to ambulate 1.5 miles prior to increased lumbar and LE pain.  PATIENT GOALS: to be able to perform stairs   OBJECTIVE:   DIAGNOSTIC FINDINGS:  Lumbar MRI in 2023: -Alignment: Mild retrolisthesis L1-2. Mild anterolisthesis L3-4. Mild retrolisthesis L4-5. IMPRESSION: -Motion degraded study  -Negative for fracture or metastatic disease  -Lumbar spondylosis. Multilevel spinal and foraminal stenosis  X ray of R hip in 2023:  IMPRESSION: Mild osteoarthritis of the right hip.  X rays of L knee in 2023: IMPRESSION: Mild-to-moderate medial and patellofemoral osteoarthritis.  Chronic spurring at the patellar tendon insertion on the tibial tubercle.   TODAY'S TREATMENT:                                                                                                                                    Nustep L5 bilat Ues/LE's x 5 mins Hurdles x4 fwd with rail and also sidestepped with rail Side stepping with GTB at ankles 3x10 at wall with TrA Heel/toe raises x20 Standing on airex with FT 3x30 sec  Staggered stance on airex 2  x 30 sec each  S/L clams with TrA RTB 2x10 bilat Supine alt UE/LE 2x10 with TrA Sit to stands from table 2x10          PATIENT EDUCATION:  Education details:  POC, rationale of interventions, exercise form, HEP, and relevant anatomy.  Person educated: Patient Education method: Explanation, demonstration, verbal cues, handout Education comprehension: verbalized understanding, returned demonstration, verbal cues required  HOME EXERCISE PROGRAM: Access  Code: [email or web address] URL: [email or web address] Date: 08/11/2022 Prepared by: Riki Altes  Exercises - Hooklying Hamstring Stretch with Strap  - 1 x daily - 7 x weekly - 3 sets - 30 seconds hold - Supine Active Straight Leg Raise  - 1 x daily - 7 x weekly - 3 sets - 10 reps - Sidelying Hip Abduction  - 1 x daily - 7 x weekly - 3 sets - 10 reps - Sit to Stand Without Arm Support  - 1 x daily - 7 x weekly - 3 sets - 10 reps - Supine Transversus Abdominis Bracing - Hands on Stomach  - 2 x daily - 7 x weekly - 2 sets - 10 reps - Clamshell  - 1 x daily - 5-6 x weekly - 2 sets - 10 reps  ASSESSMENT:  CLINICAL IMPRESSION: Pt's knees are feeling better since the injections.  He required cuing and instruction in correct form with exercises.  He was unable to perform supine alt UE/LE with proper sequencing even with cuing.  Pt was able to ambulate fwd over hurdles well with UE support on rail.  He had difficulty with lateral stepping over hurdles.  Pt responded well to  Rx having no pain after Rx.    OBJECTIVE IMPAIRMENTS: Abnormal gait, decreased activity tolerance, decreased endurance, decreased mobility, difficulty walking, decreased strength, and pain.   ACTIVITY LIMITATIONS: sitting, stairs, and locomotion level  PARTICIPATION LIMITATIONS: shopping and community activity  PERSONAL FACTORS: 3+ comorbidities: CLL, DM Type 2, arthritis, and peripheral neuropathy. Pt reports having balance deficits  are also affecting patient's functional outcome.   REHAB POTENTIAL: Good  CLINICAL DECISION MAKING: Stable/uncomplicated  EVALUATION COMPLEXITY: Low   GOALS:  SHORT TERM GOALS: Target date: 08/27/2022   Pt will be independent and compliant with HEP for improved pain, strength, ROM, and function.   Baseline: Goal status: MET 7/22  2.  Pt will report at least a 25% improvement in his mobility overall.  Baseline:  Goal status: GOAL MET  3.  Pt will be able to perform a 6 inch  step up with good form and good control. Baseline:  Goal status: MET 8/22 Target date:  09/03/2022  4.  Pt will report improved balance with his daily mobility and daily activities.  Baseline:  Goal status: IN PROGRESS (has difficulty with turns- 8/22) Target date:  10/23/2022     LONG TERM GOALS: Target date: 10/01/2022  Pt will demo improved 5x STS test to no > 16 sec for improved functional LE strength and improved performance of transfers.  Baseline:  Goal status: MET 8/22  2.  Pt will demo improved R knee extension strength to 5/5 MMT, R hip flex and knee flexion (seated) strength to 5/5 MMT and bilat hip abd strength by at least 5#'s (HHD) for improved tolerance with and performance of functional mobility.   Baseline:   Goal status: IN PROGRESS 8/22.  PT modified goal Target date:  11/06/2022  3.  Pt will report he is able to perform extended community ambulation distance without significant difficulty and without significant pain.   Baseline:  Goal status: MET (tires after ~ 1/2 mile due to CLL- knees/back do not bother him 8/22)  4.  Pt will be able to perform stairs with a reciprocal gait with rail without significant difficulty and with good control.  Baseline:  Goal status: MET 8/22. Needs railing.  5.  Pt will demo improved balance and proprioception as evidenced by performance of proprioceptive exercises with improved control and stability in order for improved performance of functional mobility including stairs with increased confidence and stability.    Goal status: INITIAL Target date:  11/06/2022    PLAN:  PT FREQUENCY: 1-2x/week  PT DURATION: 4 - 6 weeks   PLANNED INTERVENTIONS: Therapeutic exercises, Therapeutic activity, Neuromuscular re-education, Balance training, Gait training, Patient/Family education, Self Care, Joint mobilization, Stair training, DME instructions, Aquatic Therapy, Cryotherapy, scar mobilization, Taping, Manual therapy, and  Re-evaluation.  PLAN FOR NEXT SESSION: Cont with core and LE strengthening and balance exercises.       Audie Clear III PT, DPT 10/28/22 8:59 PM

## 2022-10-28 ENCOUNTER — Encounter (HOSPITAL_BASED_OUTPATIENT_CLINIC_OR_DEPARTMENT_OTHER): Payer: Self-pay | Admitting: Physical Therapy

## 2022-11-02 ENCOUNTER — Other Ambulatory Visit: Payer: Self-pay | Admitting: Internal Medicine

## 2022-11-02 DIAGNOSIS — E118 Type 2 diabetes mellitus with unspecified complications: Secondary | ICD-10-CM

## 2022-11-17 ENCOUNTER — Telehealth: Payer: Self-pay | Admitting: Hematology and Oncology

## 2022-11-19 ENCOUNTER — Inpatient Hospital Stay: Payer: Medicare Other | Attending: Hematology and Oncology

## 2022-11-19 ENCOUNTER — Inpatient Hospital Stay: Payer: Medicare Other | Admitting: Hematology and Oncology

## 2022-11-19 ENCOUNTER — Other Ambulatory Visit: Payer: Self-pay | Admitting: Hematology and Oncology

## 2022-11-19 ENCOUNTER — Inpatient Hospital Stay: Payer: Medicare Other

## 2022-11-19 VITALS — BP 153/78 | HR 75 | Temp 97.3°F | Resp 14 | Wt 232.1 lb

## 2022-11-19 DIAGNOSIS — C9110 Chronic lymphocytic leukemia of B-cell type not having achieved remission: Secondary | ICD-10-CM | POA: Diagnosis present

## 2022-11-19 DIAGNOSIS — D6959 Other secondary thrombocytopenia: Secondary | ICD-10-CM | POA: Insufficient documentation

## 2022-11-19 DIAGNOSIS — E119 Type 2 diabetes mellitus without complications: Secondary | ICD-10-CM

## 2022-11-19 DIAGNOSIS — Z79899 Other long term (current) drug therapy: Secondary | ICD-10-CM | POA: Insufficient documentation

## 2022-11-19 DIAGNOSIS — D696 Thrombocytopenia, unspecified: Secondary | ICD-10-CM

## 2022-11-19 DIAGNOSIS — D72820 Lymphocytosis (symptomatic): Secondary | ICD-10-CM

## 2022-11-19 DIAGNOSIS — Z0000 Encounter for general adult medical examination without abnormal findings: Secondary | ICD-10-CM

## 2022-11-19 LAB — CMP (CANCER CENTER ONLY)
ALT: 15 U/L (ref 0–44)
AST: 21 U/L (ref 15–41)
Albumin: 4.2 g/dL (ref 3.5–5.0)
Alkaline Phosphatase: 70 U/L (ref 38–126)
Anion gap: 10 (ref 5–15)
BUN: 12 mg/dL (ref 8–23)
CO2: 28 mmol/L (ref 22–32)
Calcium: 9 mg/dL (ref 8.9–10.3)
Chloride: 101 mmol/L (ref 98–111)
Creatinine: 0.95 mg/dL (ref 0.61–1.24)
GFR, Estimated: >60 mL/min (ref >60)
Glucose, Bld: 132 mg/dL — ABNORMAL HIGH (ref 70–99)
Potassium: 4.6 mmol/L (ref 3.5–5.1)
Sodium: 139 mmol/L (ref 135–145)
Total Bilirubin: 1.6 mg/dL — ABNORMAL HIGH (ref 0.3–1.2)
Total Protein: 7.1 g/dL (ref 6.5–8.1)

## 2022-11-19 LAB — CBC WITH DIFFERENTIAL (CANCER CENTER ONLY)
Abs Immature Granulocytes: 0.03 10*3/uL (ref 0.00–0.07)
Basophils Absolute: 0 10*3/uL (ref 0.0–0.1)
Basophils Relative: 0 %
Eosinophils Absolute: 0.1 10*3/uL (ref 0.0–0.5)
Eosinophils Relative: 1 %
HCT: 45.7 % (ref 39.0–52.0)
Hemoglobin: 16.2 g/dL (ref 13.0–17.0)
Immature Granulocytes: 0 %
Lymphocytes Relative: 62 %
Lymphs Abs: 5.2 10*3/uL — ABNORMAL HIGH (ref 0.7–4.0)
MCH: 32.9 pg (ref 26.0–34.0)
MCHC: 35.4 g/dL (ref 30.0–36.0)
MCV: 92.7 fL (ref 80.0–100.0)
Monocytes Absolute: 0.5 10*3/uL (ref 0.1–1.0)
Monocytes Relative: 6 %
Neutro Abs: 2.6 10*3/uL (ref 1.7–7.7)
Neutrophils Relative %: 31 %
Platelet Count: 110 10*3/uL — ABNORMAL LOW (ref 150–400)
RBC: 4.93 MIL/uL (ref 4.22–5.81)
RDW: 13.9 % (ref 11.5–15.5)
WBC Count: 8.4 10*3/uL (ref 4.0–10.5)
nRBC: 0 % (ref 0.0–0.2)

## 2022-11-19 LAB — HEMOGLOBIN A1C
Hgb A1c MFr Bld: 6.7 % — ABNORMAL HIGH (ref 4.8–5.6)
Mean Plasma Glucose: 145.59 mg/dL

## 2022-11-19 NOTE — Progress Notes (Signed)
[facility] [phone]   [phone]  PROGRESS NOTE  Patient Care Team: Etta Grandchild, MD as PCP - General (Internal Medicine) Runell Gess, MD as PCP - Cardiology (Cardiology) Drema Dallas, DO as Consulting Physician (Neurology) Ellin Saba, [facility] (Inactive) (Pharmacist)  Hematological/Oncological History # Chronic Lymphocytic Leukemia (CLL) Rai Stage 0 # Thrombocytopenia 2/2 to Cirrhosis 10/04/2020: establish care with Dr. Leonides Schanz. WBC 9.7, ALC 5100. 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 male with medical history significant for CLL who presents for a follow up visit. The patient's last visit was on 11/15/2021. In the interim since the last visit he has been in his normal state of health.   On exam today Patrick Kidd reports he has been well overall and interim since her last visit.  He underwent a back operation in March 2024 and then had a gallbladder infection which was treated with antibiotics.  Surgery was deferred at that time.  He has had no other major changes in his health.  He reports as he gets older he is "slowing down".  He notes that he is not having any bleeding such as nosebleeds, gum bleeding, or blood in the urine/stool.  He reports the skin is dry and he does bruise quite easily.  He notes he has a bruise on his arm right now.  His energy levels have been good and he is able to perform his day-to-day tasks without any difficulty.  He reports around 4 PM he does get worn out.  He is not having any trouble with nausea, vomiting, or diarrhea.  He reports that he is eating well with no unexpected weight loss.  He did have a drop in his weight when he had the gallbladder infection.  He notes he would like to weigh 215 pounds.  Overall his health has been steady and he has no additional questions or concerns today.  He  notes he is not having any issues with fevers, chills, sweats, nausea, vomiting or diarrhea.  He denies any lymphadenopathy.  Full 10 point ROS is listed below.  MEDICAL HISTORY:  Past Medical History:  Diagnosis Date   Acrophobia    Alcoholic cirrhosis (HCC) 10/04/2015   Anemia    Arthritis    foot by big toe   BPH associated with nocturia    Cataract    removed both eyes   Cholecystitis 05/2022   tx Abx   Chronic cough    PMH of   CLL (chronic lymphocytic leukemia) (HCC)    COVID-19 12/2018   Diabetes mellitus without complication (HCC)    Diverticulosis 07/03/2010   Colonoscopy.    Duodenal ulcer 2017   Fallen arches    Bilateral   GERD (gastroesophageal reflux disease)    Gout    Granuloma annulare    Hx of adenomatous colonic polyps multiple   Hydrocele 2011   Large septated right hydrocele   Liver cyst    Liver lesion    Nonspecific elevation of levels of transaminase or lactic acid dehydrogenase (LDH)    Obesity    Peripheral neuropathy    Plantar fasciitis    PMH of   Portal hypertension (HCC) 2017   Prostate cancer (HCC)    Right shoulder pain 11/2017   Sleep apnea    no cpap, patient denies   Thiamine deficiency    Wears reading eyeglasses  WPW (Wolff-Parkinson-White syndrome) 02/05/2019    SURGICAL HISTORY: Past Surgical History:  Procedure Laterality Date   BACK SURGERY  04/28/2022   CATARACT EXTRACTION, BILATERAL  12/2011   Dr Nile Riggs   COLONOSCOPY  2017   COLONOSCOPY W/ POLYPECTOMY  07/03/2010   2 adenomas, diverticulosis on right. Dr Leone Payor   CYSTOSCOPY N/A 03/15/2018   Procedure: Derinda Late;  Surgeon: Crista Elliot, MD;  Location: [facility];  Service: Urology;  Laterality: N/A;  NO SEEDS FOUND IN BLADDER   ESOPHAGOGASTRODUODENOSCOPY (EGD) WITH PROPOFOL N/A 01/08/2016   Procedure: ESOPHAGOGASTRODUODENOSCOPY (EGD) WITH PROPOFOL;  Surgeon: Meryl Dare, MD;  Location: [facility];  Service: Endoscopy;   Laterality: N/A;   FLEXIBLE SIGMOIDOSCOPY  2000   RADIOACTIVE SEED IMPLANT N/A 03/15/2018   Procedure: RADIOACTIVE SEED IMPLANT/BRACHYTHERAPY IMPLANT;  Surgeon: Crista Elliot, MD;  Location: [facility];  Service: Urology;  Laterality: N/A;   73 SEEDS IMPLANTED   SIGMOIDOSCOPY     SPACE OAR INSTILLATION N/A 03/15/2018   Procedure: SPACE OAR INSTILLATION;  Surgeon: Crista Elliot, MD;  Location[facility];  Service: Urology;  Laterality: N/A;   WISDOM TOOTH EXTRACTION      SOCIAL HISTORY: Social History   Socioeconomic History   Marital status: Married    Spouse name: [name]   Number of children: 2   Years of education: Not on file   Highest education level: Some college, no degree  Occupational History   Occupation: Multimedia programmer estate  Tobacco Use   Smoking status: Former    Current packs/day: 0.00    Average packs/day: 2.0 packs/day for 6.0 years (12.0 ttl pk-yrs)    Types: Cigarettes    Start date: 02/03/1965    Quit date: 02/04/1971    Years since quitting: 51.8    Passive exposure: Never   Smokeless tobacco: Never   Tobacco comments:    smoked age [age], up to 2 ppd  Vaping Use   Vaping status: Never Used  Substance and Sexual Activity   Alcohol use: Not Currently    Comment: No alochol since Christmas Day 2020   Drug use: No   Sexual activity: Yes    Partners: Female  Other Topics Concern   Not on file  Social History Narrative   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 He lives with his wife in a one level home. He swims most days.   Social Determinants of Health   Financial Resource Strain: Low Risk  (12/02/2021)   Overall Financial Resource Strain (CARDIA)    Difficulty of Paying Living Expenses: Not hard at all  Food Insecurity: No Food Insecurity  (06/05/2022)   Hunger Vital Sign    Worried About Running Out of Food in the Last Year: Never true    Ran Out of Food in the Last Year: Never true  Transportation Needs: No Transportation Needs (06/05/2022)   PRAPARE - Administrator, Civil Service (Medical): No    Lack of Transportation (Non-Medical): No  Physical Activity: Inactive (12/02/2021)   Exercise Vital Sign    Days of Exercise per Week: 0 days    Minutes of Exercise per Session: 0 min  Stress: No Stress Concern Present (12/02/2021)   [employer] of Occupational Health -  Occupational Stress Questionnaire    Feeling of Stress : Not at all  Social Connections: Moderately Integrated (12/02/2021)   Social Connection and Isolation Panel [NHANES]    Frequency of Communication with Friends and Family: More than three times a week    Frequency of Social Gatherings with Friends and Family: More than three times a week    Attends Religious Services: Never    Database administrator or Organizations: Yes    Attends Engineer, structural: More than 4 times per year    Marital Status: Married  Catering manager Violence: Not At Risk (05/30/2022)   Humiliation, Afraid, Rape, and Kick questionnaire    Fear of Current or Ex-Partner: No    Emotionally Abused: No    Physically Abused: No    Sexually Abused: No    FAMILY HISTORY: Family History  Problem Relation Age of Onset   Lung cancer Mother        smoker   Leukemia Father        Acute myelocytic   Diabetes Neg Hx    Stroke Neg Hx    Heart disease Neg Hx    Colon cancer Neg Hx    Colon polyps Neg Hx    Esophageal cancer Neg Hx    Rectal cancer Neg Hx    Stomach cancer Neg Hx     ALLERGIES:  is allergic to rifaximin, beta adrenergic blockers, and calcium channel blockers.  MEDICATIONS:  Current Outpatient Medications  Medication Sig Dispense Refill   acetaminophen (TYLENOL) 325 MG tablet Take 650 mg by mouth daily as needed for moderate pain.      Continuous Blood Gluc Receiver (FREESTYLE LIBRE 3 READER) DEVI 1 Act by Does not apply route daily. 1 each 3   Continuous Blood Gluc Sensor (FREESTYLE LIBRE 3 SENSOR) MISC 1 Act by Does not apply route daily. Place 1 sensor on the skin every 14 days. Use to check glucose continuously 2 each 5   gabapentin (NEURONTIN) 300 MG capsule Take 1 capsule (300 mg total) by mouth 3 (three) times daily. 270 capsule 3   insulin glargine, 2 Unit Dial, (TOUJEO MAX SOLOSTAR) 300 UNIT/ML Solostar Pen Inject 50 Units into the skin daily. Via SANOFI pt assistance 15 mL 1   Insulin Pen Needle (PEN NEEDLES) 32G X 5 MM MISC Inject 1 Act into the skin daily. Use to administer insulin. Dx E11.9 Dispense based on insurance preference. 100 each 1   Lancets (ACCU-CHEK MULTICLIX) lancets Use to check sugar daily. DX: E11.8 100 each 12   metFORMIN (GLUCOPHAGE) 500 MG tablet TAKE 1 TABLET BY MOUTH 2 TIMES DAILY WITH A MEAL. 180 tablet 0   pantoprazole (PROTONIX) 40 MG tablet Take 1 tablet (40 mg total) by mouth 2 (two) times daily. TAKE 1 TABLET 30 MINUTES BEFORE MEALS (BREAKFAST AND SUPPER) TWICE A DAY 180 tablet 0   No current facility-administered medications for this visit.    REVIEW OF SYSTEMS:   Constitutional: ( - ) fevers, ( - )  chills , ( - ) night sweats Eyes: ( - ) blurriness of vision, ( - ) double vision, ( - ) watery eyes Ears, nose, mouth, throat, and face: ( - ) mucositis, ( - ) sore throat Respiratory: ( - ) cough, ( - ) dyspnea, ( - ) wheezes Cardiovascular: ( - ) palpitation, ( - ) chest discomfort, ( - ) lower extremity swelling Gastrointestinal:  ( - ) nausea, ( - ) heartburn, ( - )  change in bowel habits Skin: ( - ) abnormal skin rashes Lymphatics: ( - ) new lymphadenopathy, ( - ) easy bruising Neurological: ( - ) numbness, ( - ) tingling, ( - ) new weaknesses Behavioral/Psych: ( - ) mood change, ( - ) new changes  All other systems were reviewed with the patient and are negative.  PHYSICAL  EXAMINATION: ECOG PERFORMANCE STATUS: 1 - Symptomatic but completely ambulatory  There were no vitals filed for this visit.  There were no vitals filed for this visit.   GENERAL: Well-appearing elderly Caucasian male, alert, no distress and comfortable SKIN: skin color, texture, turgor are normal, no rashes or significant lesions EYES: conjunctiva are pink and non-injected, sclera clear NECK: supple, non-tender LYMPH:  no palpable lymphadenopathy in the cervical, axillary or inguinal LUNGS: clear to auscultation and percussion with normal breathing effort HEART: regular rate & rhythm and no murmurs and no lower extremity edema Musculoskeletal: no cyanosis of digits and no clubbing  PSYCH: alert & oriented x 3, fluent speech NEURO: no focal motor/sensory deficits  LABORATORY DATA:  I have reviewed the data as listed    Latest Ref Rng & Units 06/10/2022    9:59 AM 06/03/2022    3:19 AM 06/02/2022   12:53 AM  CBC  WBC 4.0 - 10.5 K/uL 8.0  10.2  11.4   Hemoglobin 13.0 - 17.0 g/dL 16.1  09.6  04.5   Hematocrit 39.0 - 52.0 % 39.6  35.7  34.8   Platelets 150.0 - 400.0 K/uL 298.0  121  82        Latest Ref Rng & Units 06/10/2022    9:59 AM 06/03/2022    3:19 AM 06/02/2022   12:53 AM  CMP  Glucose 70 - 99 mg/dL 409  811  914   BUN 6 - 23 mg/dL 8  13  15    Creatinine 0.40 - 1.50 mg/dL 7.82  9.56  2.13   Sodium 135 - 145 mEq/L 137  130  128   Potassium 3.5 - 5.1 mEq/L 4.5  3.4  3.6   Chloride 96 - 112 mEq/L 99  94  94   CO2 19 - 32 mEq/L 30  24  25    Calcium 8.4 - 10.5 mg/dL 9.1  8.1  7.9   Total Protein 6.0 - 8.3 g/dL 6.2  5.8  5.9   Total Bilirubin 0.2 - 1.2 mg/dL 1.2  1.7  2.5   Alkaline Phos 39 - 117 U/L 232  93  92   AST 0 - 37 U/L 21  18  18    ALT 0 - 53 U/L 11  15  17     RADIOGRAPHIC STUDIES: I have personally reviewed the radiological images as listed and agreed with the findings in the report. [place] LIMITED JOINT SPACE STRUCTURES LOW BILAT(NO LINKED CHARGES)  Result Date:  10/23/2022 Table formatting from the original result was not included. Images from the original result were not included. Patrick Kidd presents to clinic today for Durolane injection bilateral knee 1/1 Procedure: Real-time Ultrasound Guided Injection of right knee joint superior lateral patellar space Device: Philips Affiniti 50G/GE Logiq Images permanently stored and available for review in PACS Verbal informed consent obtained.  Discussed risks and benefits of procedure. Warned about infection, bleeding, damage to structures among others. Patient expresses understanding and agreement Time-out conducted.  Noted no overlying erythema, induration, or other signs of local infection.  Skin prepped in a sterile fashion.  Local anesthesia: Topical Ethyl chloride.  With sterile technique and under real time ultrasound guidance: Durolane 60 mg injected into knee joint. Fluid seen entering the joint capsule.  Completed without difficulty  Advised to call if fevers/chills, erythema, induration, drainage, or persistent bleeding.  Images permanently stored and available for review in the ultrasound unit. Impression: Technically successful ultrasound guided injection.  Procedure: Real-time Ultrasound Guided Injection of left knee joint superior lateral patellar space Device: Philips Affiniti 50G/GE Logiq Images permanently stored and available for review in PACS Verbal informed consent obtained.  Discussed risks and benefits of procedure. Warned about infection, bleeding, damage to structures among others. Patient expresses understanding and agreement Time-out conducted.  Noted no overlying erythema, induration, or other signs of local infection.  Skin prepped in a sterile fashion.  Local anesthesia: Topical Ethyl chloride.  With sterile technique and under real time ultrasound guidance: Durolane 60 mg injected into knee joint. Fluid seen entering the joint capsule.  Completed without difficulty  Advised to call if  fevers/chills, erythema, induration, drainage, or persistent bleeding.  Images permanently stored and available for review in the ultrasound unit. Impression: Technically successful ultrasound guided injection. Lot number: [id number] for both injections        ASSESSMENT & PLAN Patrick Kidd 77 y.o. male with medical history significant for CLL who presents for a follow up visit.  At this time the findings are most consistent with CLL.  The patient has a monoclonal B-cell lymphocytosis, however the absolute lymphocyte count is greater than 5000 therefore would meet diagnostic criteria for CLL.  He does not have a leukocytosis and does not have any apparent lymphadenopathy or splenomegaly.  He does have thrombocytopenia which is likely secondary to his known liver disease.  Therefore this is a Rai stage 0 lymphocytosis.  Previously I discussed with the patient the nature of CLL and treatment options moving forward.  Given that this is early stage and he does not have any concerning cytopenias or obstructive lymphadenopathy I recommend he continue with observation at this time.  We will order genetic testing in order to determine how aggressive this leukemia is.  In many cases CLL does not require treatment as it is a slow progressing malignancy.  At this time I recommend observation with return to clinic in approximately 6 months time to reevaluate.  # Chronic Lymphocytic Leukemia (CLL) Rai Stage 0 --Findings are consistent with chronic lymphocytic leukemia.  He is currently Rai stage 0. --No evidence of lymphadenopathy or splenomegaly at this time --No clear indication to start treatment --CLL prognostic panel showed del 13, IGVH (inconclusive), and Zap 70 (borderline)  --labs today show WBC 8.4, hemoglobin 16.2, MCV 92.7, platelets 110 --Plan for return to clinic in 12 months time to continue observation.  # Thrombocytopenia --patient noted to have cirrhotic morphology of the liver on [place] from  07/24/2020 --stronger evidence this represents thrombocytopenia 2/2 to liver disease and not from the CLL --continue to monitor   No orders of the defined types were placed in this encounter.   All questions were answered. The patient knows to call the clinic with any problems, questions or concerns.  A total of more than 30 minutes were spent on this encounter with face-to-face time and non-face-to-face time, including preparing to see the patient, ordering tests and/or medications, counseling the patient and coordination of care as outlined above.   Ulysees Barns, MD Department of Hematology/Oncology [facility] Phone: [phone] Pager: [phone] Email: [email or web address]  11/19/2022 7:48  AM

## 2022-11-20 ENCOUNTER — Ambulatory Visit (HOSPITAL_BASED_OUTPATIENT_CLINIC_OR_DEPARTMENT_OTHER): Payer: Medicare Other | Attending: Neurosurgery

## 2022-11-20 ENCOUNTER — Encounter (HOSPITAL_BASED_OUTPATIENT_CLINIC_OR_DEPARTMENT_OTHER): Payer: Self-pay

## 2022-11-20 DIAGNOSIS — R262 Difficulty in walking, not elsewhere classified: Secondary | ICD-10-CM | POA: Insufficient documentation

## 2022-11-20 DIAGNOSIS — M6281 Muscle weakness (generalized): Secondary | ICD-10-CM | POA: Diagnosis not present

## 2022-11-20 DIAGNOSIS — M5416 Radiculopathy, lumbar region: Secondary | ICD-10-CM | POA: Diagnosis not present

## 2022-11-20 NOTE — Therapy (Addendum)
OUTPATIENT PHYSICAL THERAPY THORACOLUMBAR TREATMENT   Patient Name: Patrick Kidd MRN: [id number] DOB:06-23-1945, 77 y.o., male Today's Date: 11/20/2022  END OF SESSION: PT End of Session -  10/27/22    Visit Number 15   Number of Visits 19   Date for PT Re-Evaluation 11/06/2022   Authorization Type MCR A & B   PT Start Time 1158   PT Stop Time 1236   PT Time Calculation (min) 38 min   Activity Tolerance Patient tolerated treatment well   Behavior During Therapy  WFL for tasks assessed/performed           Past Medical History:  Diagnosis Date   Acrophobia    Alcoholic cirrhosis (HCC) 10/04/2015   Anemia    Arthritis    foot by big toe   BPH associated with nocturia    Cataract    removed both eyes   Cholecystitis 05/2022   tx Abx   Chronic cough    PMH of   CLL (chronic lymphocytic leukemia) (HCC)    COVID-19 12/2018   Diabetes mellitus without complication (HCC)    Diverticulosis 07/03/2010   Colonoscopy.    Duodenal ulcer 2017   Fallen arches    Bilateral   GERD (gastroesophageal reflux disease)    Gout    Granuloma annulare    Hx of adenomatous colonic polyps multiple   Hydrocele 2011   Large septated right hydrocele   Liver cyst    Liver lesion    Nonspecific elevation of levels of transaminase or lactic acid dehydrogenase (LDH)    Obesity    Peripheral neuropathy    Plantar fasciitis    PMH of   Portal hypertension (HCC) 2017   Prostate cancer (HCC)    Right shoulder pain 11/2017   Sleep apnea    no cpap, patient denies   Thiamine deficiency    Wears reading eyeglasses    WPW (Wolff-Parkinson-White syndrome) 02/05/2019   Past Surgical History:  Procedure Laterality Date   BACK SURGERY  04/28/2022   CATARACT EXTRACTION, BILATERAL  12/2011   Dr Nile Riggs   COLONOSCOPY  2017   COLONOSCOPY W/ POLYPECTOMY  07/03/2010   2 adenomas, diverticulosis on right. Dr Leone Payor   CYSTOSCOPY N/A 03/15/2018   Procedure: Derinda Late;   Surgeon: Crista Elliot, MD;  Location: [facility];  Service: Urology;  Laterality: N/A;  NO SEEDS FOUND IN BLADDER   ESOPHAGOGASTRODUODENOSCOPY (EGD) WITH PROPOFOL N/A 01/08/2016   Procedure: ESOPHAGOGASTRODUODENOSCOPY (EGD) WITH PROPOFOL;  Surgeon: Meryl Dare, MD;  Location: [facility];  Service: Endoscopy;  Laterality: N/A;   FLEXIBLE SIGMOIDOSCOPY  2000   RADIOACTIVE SEED IMPLANT N/A 03/15/2018   Procedure: RADIOACTIVE SEED IMPLANT/BRACHYTHERAPY IMPLANT;  Surgeon: Crista Elliot, MD;  Location: [facility];  Service: Urology;  Laterality: N/A;   73 SEEDS IMPLANTED   SIGMOIDOSCOPY     SPACE OAR INSTILLATION N/A 03/15/2018   Procedure: SPACE OAR INSTILLATION;  Surgeon: Crista Elliot, MD;  Location: [facility];  Service: Urology;  Laterality: N/A;   WISDOM TOOTH EXTRACTION     Patient Active Problem List   Diagnosis Date Noted   E coli bacteremia 06/01/2022   Sludge in gallbladder 05/30/2022   Hyperbilirubinemia 05/30/2022   Urinary incontinence 06/25/2021   Degenerative lumbar spinal stenosis 04/30/2021   Degenerative arthritis of knee, bilateral 03/27/2021   Chronic lymphocytic leukemia (HCC) 11/08/2020   Insulin-requiring or dependent type II diabetes mellitus (HCC) 09/25/2020  Stage 3a chronic kidney disease (HCC) 03/15/2020   Thiamine deficiency    Paroxysmal atrial fibrillation (HCC) 07/19/2019   Allergic rhinitis 06/09/2019   Erectile dysfunction due to diabetes mellitus (HCC) 06/09/2019   Overweight with body mass index (BMI) of 28 to 28.9 in adult 03/17/2019   WPW (Wolff-Parkinson-White syndrome) 02/05/2019   SBP (spontaneous bacterial peritonitis) (HCC)-December 2020,     Encephalopathy, hepatic (HCC)    Degenerative disc disease, cervical 07/21/2018   Cervical radiculopathy 07/16/2018   OAB (overactive bladder) 07/06/2018   Malignant neoplasm of prostate (HCC) 01/13/2018   Hyperlipidemia LDL goal <100  11/11/2017   Essential hypertension 11/11/2017   BPH associated with nocturia 11/11/2017   Erectile dysfunction due to arterial insufficiency 04/07/2017   Peripheral vascular disease (HCC) 06/25/2016   Alcoholic cirrhosis (HCC) 10/04/2015   Type 2 diabetes mellitus with complication, with long-term current use of insulin (HCC) 08/22/2015   Hypersomnolence 03/19/2014   Peripheral neuropathy 01/11/2013   Polyclonal gammopathy 01/11/2013   GERD 10/04/2009     REFERRING PROVIDER: Julio Sicks, MD   REFERRING DIAG: M54.16 (ICD-10-CM) - Radiculopathy, lumbar region  Rationale for Evaluation and Treatment: Rehabilitation  THERAPY DIAG:  Radiculopathy, lumbar region  Muscle weakness (generalized)  Difficulty in walking, not elsewhere classified  ONSET DATE: DOS 04/28/2022  SUBJECTIVE:                                                                                                                                                                                           SUBJECTIVE STATEMENT:  Pt reports the injections helped for about 6 days. Pain level has since returned. Needs railing when going down steps.    PERTINENT HISTORY:  Lumbar decompression surgery on 04/27/2022 Lumbar MRI in 2023 showed mild retrolisthesis L1-2, mild anterolisthesis L3-4. And mild retrolisthesis L4-5. DM type 2 and Peripheral neuropathy CLL (chronic lymphocytic leukemia) Arthritis (X ray findings of OA in R hip and L knee) Prostate CA with radiation Alcoholic Cirrhosis WPW (Wolff-Parkinson-White syndrome), Pt states it doesn't affect him  PAIN:  Are you having pain? No     PRECAUTIONS: Other: per surgery ; mild retrolisthesis and anterolisthesis ; CLL and peripheral neuropathy ; Pt reports having balance deficits  WEIGHT BEARING RESTRICTIONS: none indicated on MD note and pt reports none also  FALLS:  Has patient fallen in last 6 months? No  LIVING ENVIRONMENT: Lives with: lives with their  spouse Lives in: 1 story home Stairs: 1 step with 1 rail Has following equipment at home: cane  OCCUPATION: part time work.  Driving.  Pt does have to ambulate on uneven terrain.    PLOF:  Independent.  Pt was able to ambulate 1.5 miles prior to increased lumbar and LE pain.  PATIENT GOALS: to be able to perform stairs   OBJECTIVE:   LOWER EXTREMITY MMT:     MMT Right eval Left eval R 8/22 L 8/22 R 10/17  Hip flexion 4+/5 5/5 5/5 5/5   Hip extension           Hip abduction 33.0 36.3 39.0 39.3   Hip adduction           Hip internal rotation           Hip external rotation           Knee flexion 4/5 seated 5/5 / seated with knee pain 5/5 5/5   Knee extension 5/5 4+/5 4/5 5/5 5/5  Ankle dorsiflexion 5/5 5/5       Ankle plantarflexion WFL seated WFL seated       Ankle inversion           Ankle eversion            (Blank rows = not tested)     DIAGNOSTIC FINDINGS:  Lumbar MRI in 2023: -Alignment: Mild retrolisthesis L1-2. Mild anterolisthesis L3-4. Mild retrolisthesis L4-5. IMPRESSION: -Motion degraded study  -Negative for fracture or metastatic disease  -Lumbar spondylosis. Multilevel spinal and foraminal stenosis  X ray of R hip in 2023:  IMPRESSION: Mild osteoarthritis of the right hip.  X rays of L knee in 2023: IMPRESSION: Mild-to-moderate medial and patellofemoral osteoarthritis.  Chronic spurring at the patellar tendon insertion on the tibial tubercle.   TODAY'S TREATMENT:                                                                                                                                    Nustep L5 bilat Ues/LE's x Updated MMT Review of goals Side stepping with GTB at ankles x3 laps at rail Heel/toe raises x20 on airex Standing on airex with FT 3x30 sec  Tandem (modified) 30sec x2ea SLS trials  HEP update and review           PATIENT EDUCATION:  Education details:  POC, rationale of interventions, exercise form, HEP, and  relevant anatomy.  Person educated: Patient Education method: Explanation, demonstration, verbal cues, handout Education comprehension: verbalized understanding, returned demonstration, verbal cues required  HOME EXERCISE PROGRAM: Access Code: [email or web address] URL: [email or web address] Date: 08/11/2022 Prepared by: Riki Altes  Exercises - Hooklying Hamstring Stretch with Strap  - 1 x daily - 7 x weekly - 3 sets - 30 seconds hold - Supine Active Straight Leg Raise  - 1 x daily - 7 x weekly - 3 sets - 10 reps - Sidelying Hip Abduction  - 1 x daily - 7 x weekly - 3 sets - 10 reps - Sit to Stand Without Arm Support  - 1 x daily - 7 x weekly - 3 sets - 10 reps -  Supine Transversus Abdominis Bracing - Hands on Stomach  - 2 x daily - 7 x weekly - 2 sets - 10 reps - Clamshell  - 1 x daily - 5-6 x weekly - 2 sets - 10 reps  ASSESSMENT:  CLINICAL IMPRESSION: Pt has met 3/4 short term goals and 3/5 long term goals. Pt denies pain in low back and is not limited by back with ADLs. Pt has reached MMI with PT at this time. He will be d/c to HEP. If balance deficits continue to limit pt, he was instructed to return to MD to receive PT script to address this. Pt plans to begin working out at gym to further his strength. He was provided with a detailed HEP for both strengthening and balance work.    OBJECTIVE IMPAIRMENTS: Abnormal gait, decreased activity tolerance, decreased endurance, decreased mobility, difficulty walking, decreased strength, and pain.   ACTIVITY LIMITATIONS: sitting, stairs, and locomotion level  PARTICIPATION LIMITATIONS: shopping and community activity  PERSONAL FACTORS: 3+ comorbidities: CLL, DM Type 2, arthritis, and peripheral neuropathy. Pt reports having balance deficits  are also affecting patient's functional outcome.   REHAB POTENTIAL: Good  CLINICAL DECISION MAKING: Stable/uncomplicated  EVALUATION COMPLEXITY: Low   GOALS:  SHORT TERM GOALS: Target  date: 08/27/2022   Pt will be independent and compliant with HEP for improved pain, strength, ROM, and function.   Baseline: Goal status: MET 7/22  2.  Pt will report at least a 25% improvement in his mobility overall.  Baseline:  Goal status: GOAL MET  3.  Pt will be able to perform a 6 inch step up with good form and good control. Baseline:  Goal status: MET 8/22 Target date:  09/03/2022  4.  Pt will report improved balance with his daily mobility and daily activities.  Baseline:  Goal status: IN PROGRESS (has difficulty with turns- 8/22) Target date:  10/23/2022     LONG TERM GOALS: Target date: 10/01/2022  Pt will demo improved 5x STS test to no > 16 sec for improved functional LE strength and improved performance of transfers.  Baseline:  Goal status: MET 8/22  2.  Pt will demo improved R knee extension strength to 5/5 MMT, R hip flex and knee flexion (seated) strength to 5/5 MMT and bilat hip abd strength by at least 5#'s (HHD) for improved tolerance with and performance of functional mobility.   Baseline:   Goal status: IN PROGRESS 8/22.  PT modified goal Target date:  11/06/2022  3.  Pt will report he is able to perform extended community ambulation distance without significant difficulty and without significant pain.   Baseline:  Goal status: MET (tires after ~ 1/2 mile due to CLL- knees/back do not bother him 8/22)  4.  Pt will be able to perform stairs with a reciprocal gait with rail without significant difficulty and with good control.  Baseline:  Goal status: MET 8/22. Needs railing.  5.  Pt will demo improved balance and proprioception as evidenced by performance of proprioceptive exercises with improved control and stability in order for improved performance of functional mobility including stairs with increased confidence and stability.    Goal status: NOT MET  Target date:  11/06/2022    PLAN:  PT FREQUENCY: 1-2x/week  PT DURATION: 4 - 6  weeks   PLANNED INTERVENTIONS: Therapeutic exercises, Therapeutic activity, Neuromuscular re-education, Balance training, Gait training, Patient/Family education, Self Care, Joint mobilization, Stair training, DME instructions, Aquatic Therapy, Cryotherapy, scar mobilization, Taping, Manual therapy, and  Re-evaluation.  PLAN FOR NEXT SESSION: Pt to be discharged.  See below or above in assessment       Riki Altes, PTA  11/20/22 3:47 PM  PHYSICAL THERAPY DISCHARGE SUMMARY  Visits from Start of Care: 15  Current functional level related to goals / functional outcomes: See above   Remaining deficits: See above   Education / Equipment: Pt has a HEP.   Patient spoke with Pt and PTA.  Pt to be discharged from skilled PT services due to reaching maximum functional potential at this time and good progress toward goals.  Pt is agreeable with discharge.  Pt will cont with HEP and gym exercises.  Audie Clear III PT, DPT 11/21/22 12:04 PM

## 2022-11-21 NOTE — Addendum Note (Signed)
Addended by: Susy Manor on: 11/21/2022 12:05 PM   Modules accepted: Orders

## 2022-12-02 ENCOUNTER — Encounter (HOSPITAL_COMMUNITY): Payer: Self-pay | Admitting: Emergency Medicine

## 2022-12-02 ENCOUNTER — Telehealth: Payer: Self-pay

## 2022-12-02 ENCOUNTER — Emergency Department (HOSPITAL_COMMUNITY)
Admission: EM | Admit: 2022-12-02 | Discharge: 2022-12-02 | Disposition: A | Discharge status: Home / Self Care | Payer: Medicare Other | Source: Home / Self Care | Attending: Emergency Medicine | Admitting: Emergency Medicine

## 2022-12-02 ENCOUNTER — Other Ambulatory Visit: Payer: Self-pay

## 2022-12-02 DIAGNOSIS — K819 Cholecystitis, unspecified: Secondary | ICD-10-CM | POA: Diagnosis not present

## 2022-12-02 DIAGNOSIS — Z1152 Encounter for screening for COVID-19: Secondary | ICD-10-CM | POA: Diagnosis not present

## 2022-12-02 DIAGNOSIS — Z6829 Body mass index (BMI) 29.0-29.9, adult: Secondary | ICD-10-CM | POA: Insufficient documentation

## 2022-12-02 DIAGNOSIS — R109 Unspecified abdominal pain: Secondary | ICD-10-CM | POA: Diagnosis not present

## 2022-12-02 DIAGNOSIS — K7030 Alcoholic cirrhosis of liver without ascites: Secondary | ICD-10-CM | POA: Diagnosis not present

## 2022-12-02 DIAGNOSIS — E519 Thiamine deficiency, unspecified: Secondary | ICD-10-CM | POA: Diagnosis present

## 2022-12-02 DIAGNOSIS — K766 Portal hypertension: Secondary | ICD-10-CM | POA: Diagnosis present

## 2022-12-02 DIAGNOSIS — R188 Other ascites: Secondary | ICD-10-CM | POA: Diagnosis not present

## 2022-12-02 DIAGNOSIS — I10 Essential (primary) hypertension: Secondary | ICD-10-CM | POA: Diagnosis not present

## 2022-12-02 DIAGNOSIS — L89311 Pressure ulcer of right buttock, stage 1: Secondary | ICD-10-CM | POA: Diagnosis not present

## 2022-12-02 DIAGNOSIS — E872 Acidosis, unspecified: Secondary | ICD-10-CM | POA: Diagnosis not present

## 2022-12-02 DIAGNOSIS — N1831 Chronic kidney disease, stage 3a: Secondary | ICD-10-CM | POA: Insufficient documentation

## 2022-12-02 DIAGNOSIS — D696 Thrombocytopenia, unspecified: Secondary | ICD-10-CM | POA: Diagnosis not present

## 2022-12-02 DIAGNOSIS — I959 Hypotension, unspecified: Secondary | ICD-10-CM | POA: Diagnosis not present

## 2022-12-02 DIAGNOSIS — K7460 Unspecified cirrhosis of liver: Secondary | ICD-10-CM | POA: Diagnosis not present

## 2022-12-02 DIAGNOSIS — R7989 Other specified abnormal findings of blood chemistry: Secondary | ICD-10-CM | POA: Diagnosis not present

## 2022-12-02 DIAGNOSIS — R0689 Other abnormalities of breathing: Secondary | ICD-10-CM | POA: Diagnosis not present

## 2022-12-02 DIAGNOSIS — R059 Cough, unspecified: Secondary | ICD-10-CM | POA: Diagnosis not present

## 2022-12-02 DIAGNOSIS — R1013 Epigastric pain: Secondary | ICD-10-CM | POA: Insufficient documentation

## 2022-12-02 DIAGNOSIS — C9110 Chronic lymphocytic leukemia of B-cell type not having achieved remission: Secondary | ICD-10-CM | POA: Diagnosis present

## 2022-12-02 DIAGNOSIS — Z8616 Personal history of COVID-19: Secondary | ICD-10-CM | POA: Diagnosis not present

## 2022-12-02 DIAGNOSIS — Z434 Encounter for attention to other artificial openings of digestive tract: Secondary | ICD-10-CM | POA: Diagnosis not present

## 2022-12-02 DIAGNOSIS — R509 Fever, unspecified: Secondary | ICD-10-CM | POA: Diagnosis not present

## 2022-12-02 DIAGNOSIS — R1011 Right upper quadrant pain: Secondary | ICD-10-CM | POA: Diagnosis not present

## 2022-12-02 DIAGNOSIS — Z794 Long term (current) use of insulin: Secondary | ICD-10-CM | POA: Diagnosis not present

## 2022-12-02 DIAGNOSIS — R0602 Shortness of breath: Secondary | ICD-10-CM | POA: Diagnosis not present

## 2022-12-02 DIAGNOSIS — E1122 Type 2 diabetes mellitus with diabetic chronic kidney disease: Secondary | ICD-10-CM | POA: Diagnosis present

## 2022-12-02 DIAGNOSIS — R1084 Generalized abdominal pain: Secondary | ICD-10-CM | POA: Diagnosis not present

## 2022-12-02 DIAGNOSIS — I129 Hypertensive chronic kidney disease with stage 1 through stage 4 chronic kidney disease, or unspecified chronic kidney disease: Secondary | ICD-10-CM | POA: Insufficient documentation

## 2022-12-02 DIAGNOSIS — K828 Other specified diseases of gallbladder: Secondary | ICD-10-CM | POA: Diagnosis not present

## 2022-12-02 DIAGNOSIS — I130 Hypertensive heart and chronic kidney disease with heart failure and stage 1 through stage 4 chronic kidney disease, or unspecified chronic kidney disease: Secondary | ICD-10-CM | POA: Diagnosis present

## 2022-12-02 DIAGNOSIS — R6521 Severe sepsis with septic shock: Secondary | ICD-10-CM | POA: Diagnosis not present

## 2022-12-02 DIAGNOSIS — A419 Sepsis, unspecified organism: Secondary | ICD-10-CM | POA: Diagnosis not present

## 2022-12-02 DIAGNOSIS — J9811 Atelectasis: Secondary | ICD-10-CM | POA: Diagnosis not present

## 2022-12-02 DIAGNOSIS — E669 Obesity, unspecified: Secondary | ICD-10-CM | POA: Diagnosis present

## 2022-12-02 DIAGNOSIS — A4151 Sepsis due to Escherichia coli [E. coli]: Secondary | ICD-10-CM | POA: Diagnosis not present

## 2022-12-02 DIAGNOSIS — F05 Delirium due to known physiological condition: Secondary | ICD-10-CM | POA: Diagnosis not present

## 2022-12-02 DIAGNOSIS — R0902 Hypoxemia: Secondary | ICD-10-CM | POA: Diagnosis not present

## 2022-12-02 DIAGNOSIS — G9341 Metabolic encephalopathy: Secondary | ICD-10-CM | POA: Diagnosis not present

## 2022-12-02 DIAGNOSIS — I5032 Chronic diastolic (congestive) heart failure: Secondary | ICD-10-CM | POA: Diagnosis present

## 2022-12-02 DIAGNOSIS — N179 Acute kidney failure, unspecified: Secondary | ICD-10-CM | POA: Diagnosis not present

## 2022-12-02 DIAGNOSIS — R918 Other nonspecific abnormal finding of lung field: Secondary | ICD-10-CM | POA: Diagnosis not present

## 2022-12-02 DIAGNOSIS — R000 Tachycardia, unspecified: Secondary | ICD-10-CM | POA: Diagnosis not present

## 2022-12-02 DIAGNOSIS — K8020 Calculus of gallbladder without cholecystitis without obstruction: Secondary | ICD-10-CM | POA: Diagnosis not present

## 2022-12-02 DIAGNOSIS — L89321 Pressure ulcer of left buttock, stage 1: Secondary | ICD-10-CM | POA: Diagnosis not present

## 2022-12-02 DIAGNOSIS — R4182 Altered mental status, unspecified: Secondary | ICD-10-CM | POA: Diagnosis not present

## 2022-12-02 DIAGNOSIS — K5730 Diverticulosis of large intestine without perforation or abscess without bleeding: Secondary | ICD-10-CM | POA: Diagnosis not present

## 2022-12-02 DIAGNOSIS — E119 Type 2 diabetes mellitus without complications: Secondary | ICD-10-CM | POA: Diagnosis not present

## 2022-12-02 DIAGNOSIS — R0600 Dyspnea, unspecified: Secondary | ICD-10-CM | POA: Diagnosis not present

## 2022-12-02 DIAGNOSIS — K8001 Calculus of gallbladder with acute cholecystitis with obstruction: Secondary | ICD-10-CM | POA: Diagnosis not present

## 2022-12-02 DIAGNOSIS — K810 Acute cholecystitis: Secondary | ICD-10-CM | POA: Diagnosis not present

## 2022-12-02 DIAGNOSIS — R0989 Other specified symptoms and signs involving the circulatory and respiratory systems: Secondary | ICD-10-CM | POA: Diagnosis not present

## 2022-12-02 DIAGNOSIS — D6959 Other secondary thrombocytopenia: Secondary | ICD-10-CM | POA: Diagnosis present

## 2022-12-02 DIAGNOSIS — K7689 Other specified diseases of liver: Secondary | ICD-10-CM | POA: Diagnosis not present

## 2022-12-02 DIAGNOSIS — K769 Liver disease, unspecified: Secondary | ICD-10-CM | POA: Diagnosis not present

## 2022-12-02 LAB — CBC WITH DIFFERENTIAL/PLATELET
Abs Immature Granulocytes: 0.03 10*3/uL (ref 0.00–0.07)
Basophils Absolute: 0 10*3/uL (ref 0.0–0.1)
Basophils Relative: 0 %
Eosinophils Absolute: 0 10*3/uL (ref 0.0–0.5)
Eosinophils Relative: 1 %
HCT: 44.6 % (ref 39.0–52.0)
Hemoglobin: 15.6 g/dL (ref 13.0–17.0)
Immature Granulocytes: 0 %
Lymphocytes Relative: 35 %
Lymphs Abs: 2.7 10*3/uL (ref 0.7–4.0)
MCH: 33.1 pg (ref 26.0–34.0)
MCHC: 35 g/dL (ref 30.0–36.0)
MCV: 94.5 fL (ref 80.0–100.0)
Monocytes Absolute: 0.6 10*3/uL (ref 0.1–1.0)
Monocytes Relative: 7 %
Neutro Abs: 4.3 10*3/uL (ref 1.7–7.7)
Neutrophils Relative %: 57 %
Platelets: 84 10*3/uL — ABNORMAL LOW (ref 150–400)
RBC: 4.72 MIL/uL (ref 4.22–5.81)
RDW: 13.6 % (ref 11.5–15.5)
WBC: 7.6 10*3/uL (ref 4.0–10.5)
nRBC: 0 % (ref 0.0–0.2)

## 2022-12-02 LAB — COMPREHENSIVE METABOLIC PANEL
ALT: 14 U/L (ref 0–44)
AST: 24 U/L (ref 15–41)
Albumin: 4.2 g/dL (ref 3.5–5.0)
Alkaline Phosphatase: 76 U/L (ref 38–126)
Anion gap: 12 (ref 5–15)
BUN: 18 mg/dL (ref 8–23)
CO2: 22 mmol/L (ref 22–32)
Calcium: 9.2 mg/dL (ref 8.9–10.3)
Chloride: 105 mmol/L (ref 98–111)
Creatinine, Ser: 0.99 mg/dL (ref 0.61–1.24)
GFR, Estimated: >60 mL/min (ref >60)
Glucose, Bld: 272 mg/dL — ABNORMAL HIGH (ref 70–99)
Potassium: 4.4 mmol/L (ref 3.5–5.1)
Sodium: 139 mmol/L (ref 135–145)
Total Bilirubin: 1.1 mg/dL (ref 0.3–1.2)
Total Protein: 7.4 g/dL (ref 6.5–8.1)

## 2022-12-02 LAB — LIPASE, BLOOD: Lipase: 41 U/L (ref 11–51)

## 2022-12-02 MED ORDER — LIDOCAINE VISCOUS HCL 2 % MT SOLN
15.0000 mL | Freq: Once | OROMUCOSAL | Status: AC
  Administered 2022-12-02: 15 mL via ORAL
  Filled 2022-12-02: qty 15

## 2022-12-02 MED ORDER — OXYCODONE-ACETAMINOPHEN 5-325 MG PO TABS
1.0000 | ORAL_TABLET | Freq: Once | ORAL | Status: AC
  Administered 2022-12-02: 1 via ORAL
  Filled 2022-12-02: qty 1

## 2022-12-02 MED ORDER — ACETAMINOPHEN 500 MG PO TABS: 500.0000 mg | ORAL_TABLET | Freq: Four times a day (QID) | ORAL | 0 refills | Status: DC | PRN

## 2022-12-02 MED ORDER — HYDROMORPHONE HCL 1 MG/ML IJ SOLN
1.0000 mg | INTRAMUSCULAR | Status: DC | PRN
  Administered 2022-12-02: 1 mg via INTRAVENOUS
  Filled 2022-12-02: qty 1

## 2022-12-02 MED ORDER — AMOXICILLIN-POT CLAVULANATE 875-125 MG PO TABS: 1.0000 | ORAL_TABLET | Freq: Two times a day (BID) | ORAL | 0 refills | Status: DC

## 2022-12-02 MED ORDER — ONDANSETRON HCL 4 MG/2ML IJ SOLN
4.0000 mg | Freq: Once | INTRAMUSCULAR | Status: AC
  Administered 2022-12-02: 4 mg via INTRAVENOUS
  Filled 2022-12-02: qty 2

## 2022-12-02 MED ORDER — HYDROMORPHONE HCL 1 MG/ML IJ SOLN
0.5000 mg | INTRAMUSCULAR | Status: DC | PRN
  Administered 2022-12-02: 0.5 mg via INTRAVENOUS
  Filled 2022-12-02: qty 1

## 2022-12-02 MED ORDER — ALUM & MAG HYDROXIDE-SIMETH 200-200-20 MG/5ML PO SUSP
30.0000 mL | Freq: Once | ORAL | Status: AC
  Administered 2022-12-02: 30 mL via ORAL
  Filled 2022-12-02: qty 30

## 2022-12-02 NOTE — ED Provider Notes (Signed)
[place] EMERGENCY DEPARTMENT AT [facility] Provider Note  CSN: [id number] Arrival date & time: 12/02/22 4332  Chief Complaint(s) Abdominal Pain and Nausea  HPI Patrick Kidd is a 77 y.o. male with a past medical history listed below who presents to the emergency department with epigastric and right upper quadrant pain that began around 1 AM this morning.  He is endorsing nausea without emesis.  Pain is gradually worsened since onset.  Feels similar to prior gallbladder pain.  He has not had a cholecystectomy yet.  No fevers or chills.  No chest pain or shortness of breath.  No change in bowel habits.  Still passing gas and having bowel movements.   The history is provided by the patient.    Past Medical History Past Medical History:  Diagnosis Date   Acrophobia    Alcoholic cirrhosis (HCC) 10/04/2015   Anemia    Arthritis    foot by big toe   BPH associated with nocturia    Cataract    removed both eyes   Cholecystitis 05/2022   tx Abx   Chronic cough    PMH of   CLL (chronic lymphocytic leukemia) (HCC)    COVID-19 12/2018   Diabetes mellitus without complication (HCC)    Diverticulosis 07/03/2010   Colonoscopy.    Duodenal ulcer 2017   Fallen arches    Bilateral   GERD (gastroesophageal reflux disease)    Gout    Granuloma annulare    Hx of adenomatous colonic polyps multiple   Hydrocele 2011   Large septated right hydrocele   Liver cyst    Liver lesion    Nonspecific elevation of levels of transaminase or lactic acid dehydrogenase (LDH)    Obesity    Peripheral neuropathy    Plantar fasciitis    PMH of   Portal hypertension (HCC) 2017   Prostate cancer (HCC)    Right shoulder pain 11/2017   Sleep apnea    no cpap, patient denies   Thiamine deficiency    Wears reading eyeglasses    WPW (Wolff-Parkinson-White syndrome) 02/05/2019   Patient Active Problem List   Diagnosis Date Noted   E coli bacteremia 06/01/2022   Sludge in  gallbladder 05/30/2022   Hyperbilirubinemia 05/30/2022   Urinary incontinence 06/25/2021   Degenerative lumbar spinal stenosis 04/30/2021   Degenerative arthritis of knee, bilateral 03/27/2021   Chronic lymphocytic leukemia (HCC) 11/08/2020   Insulin-requiring or dependent type II diabetes mellitus (HCC) 09/25/2020   Stage 3a chronic kidney disease (HCC) 03/15/2020   Thiamine deficiency    Paroxysmal atrial fibrillation (HCC) 07/19/2019   Allergic rhinitis 06/09/2019   Erectile dysfunction due to diabetes mellitus (HCC) 06/09/2019   Overweight with body mass index (BMI) of 28 to 28.9 in adult 03/17/2019   WPW (Wolff-Parkinson-White syndrome) 02/05/2019   SBP (spontaneous bacterial peritonitis) (HCC)-December 2020,     Encephalopathy, hepatic (HCC)    Degenerative disc disease, cervical 07/21/2018   Cervical radiculopathy 07/16/2018   OAB (overactive bladder) 07/06/2018   Malignant neoplasm of prostate (HCC) 01/13/2018   Hyperlipidemia LDL goal <100 11/11/2017   Essential hypertension 11/11/2017   BPH associated with nocturia 11/11/2017   Erectile dysfunction due to arterial insufficiency 04/07/2017   Peripheral vascular disease (HCC) 06/25/2016   Alcoholic cirrhosis (HCC) 10/04/2015   Type 2 diabetes mellitus with complication, with long-term current use of insulin (HCC) 08/22/2015   Hypersomnolence 03/19/2014   Peripheral neuropathy 01/11/2013   Polyclonal gammopathy 01/11/2013   GERD 10/04/2009  Home Medication(s) Prior to Admission medications   Medication Sig Start Date End Date Taking? Authorizing Provider  acetaminophen (TYLENOL) 325 MG tablet Take 650 mg by mouth daily as needed for moderate pain.    [provider]  Continuous Blood Gluc Receiver (FREESTYLE LIBRE 3 READER) DEVI 1 Act by Does not apply route daily. 05/07/22   Etta Grandchild, MD  Continuous Blood Gluc Sensor (FREESTYLE LIBRE 3 SENSOR) MISC 1 Act by Does not apply route daily. Place 1 sensor on the  skin every 14 days. Use to check glucose continuously 05/07/22   Etta Grandchild, MD  gabapentin (NEURONTIN) 300 MG capsule Take 1 capsule (300 mg total) by mouth 3 (three) times daily. 09/16/22   Everlena Cooper, Adam R, DO  insulin glargine, 2 Unit Dial, (TOUJEO MAX SOLOSTAR) 300 UNIT/ML Solostar Pen Inject 50 Units into the skin daily. Via SANOFI pt assistance 05/08/22   Etta Grandchild, MD  Insulin Pen Needle (PEN NEEDLES) 32G X 5 MM MISC Inject 1 Act into the skin daily. Use to administer insulin. Dx E11.9 Dispense based on insurance preference. 05/08/22   Etta Grandchild, MD  Lancets (ACCU-CHEK MULTICLIX) lancets Use to check sugar daily. DX: E11.8 12/21/17   Etta Grandchild, MD  metFORMIN (GLUCOPHAGE) 500 MG tablet TAKE 1 TABLET BY MOUTH 2 TIMES DAILY WITH A MEAL. 07/02/22   Etta Grandchild, MD  pantoprazole (PROTONIX) 40 MG tablet Take 1 tablet (40 mg total) by mouth 2 (two) times daily. TAKE 1 TABLET 30 MINUTES BEFORE MEALS (BREAKFAST AND SUPPER) TWICE A DAY 10/24/22   Etta Grandchild, MD                                                                                                                                    Allergies Rifaximin, Beta adrenergic blockers, and Calcium channel blockers  Review of Systems Review of Systems As noted in HPI  Physical Exam Vital Signs  I have reviewed the triage vital signs BP (!) 175/88 (BP Location: Right Arm)   Pulse 76   Temp 97.7 F (36.5 C) (Oral)   Resp 20   Ht 6\' 3"  (1.905 m)   Wt 105.2 kg   SpO2 98%   BMI 29.00 kg/m   Physical Exam Vitals reviewed.  Constitutional:      General: He is not in acute distress.    Appearance: He is well-developed. He is obese. He is not diaphoretic.  HENT:     Head: Normocephalic and atraumatic.     Right Ear: External ear normal.     Left Ear: External ear normal.     Nose: Nose normal.     Mouth/Throat:     Mouth: Mucous membranes are moist.  Eyes:     General: No scleral icterus.    Conjunctiva/sclera:  Conjunctivae normal.  Neck:     Trachea: Phonation normal.  Cardiovascular:  Rate and Rhythm: Normal rate and regular rhythm.  Pulmonary:     Effort: Pulmonary effort is normal. No respiratory distress.     Breath sounds: No stridor.  Abdominal:     General: There is no distension.     Tenderness: There is abdominal tenderness in the right upper quadrant and epigastric area. Negative signs include Murphy's sign.  Musculoskeletal:        General: Normal range of motion.     Cervical back: Normal range of motion.  Neurological:     Mental Status: He is alert and oriented to person, place, and time.  Psychiatric:        Behavior: Behavior normal.     ED Results and Treatments Labs (all labs ordered are listed, but only abnormal results are displayed) Labs Reviewed  COMPREHENSIVE METABOLIC PANEL  LIPASE, BLOOD  CBC WITH DIFFERENTIAL/PLATELET  URINALYSIS, ROUTINE W REFLEX MICROSCOPIC                                                                                                                         EKG  EKG Interpretation Date/Time:    Ventricular Rate:    PR Interval:    QRS Duration:    QT Interval:    QTC Calculation:   R Axis:      Text Interpretation:         Radiology No results found.  Medications Ordered in ED Medications  HYDROmorphone (DILAUDID) injection 0.5 mg (0.5 mg Intravenous Given 12/02/22 0531)  ondansetron (ZOFRAN) injection 4 mg (4 mg Intravenous Given 12/02/22 0531)   Procedures Procedures  (including critical care time) Medical Decision Making / ED Course   Medical Decision Making Amount and/or Complexity of Data Reviewed Labs: ordered.  Risk OTC drugs. Prescription drug management.     Clinical Course as of 12/02/22 0804  Tue Dec 02, 2022  0701 Upper abdominal/right upper quadrant pain workup for differential diagnosis listed below  CBC without leukocytosis or anemia.  Metabolic panel without significant electrolyte  derangements or renal sufficiency. hyperglycemia without DKA.  No evidence of bili obstruction or pancreatitis.  Less suspicious for serious intra-abdominal inflammatory/infectious process or bowel obstruction requiring advanced imaging at this time.  Patient reported improved pain following IV Dilaudid and Zofran.  Still having some discomfort.  Will provide patient with GI cocktail  [PC]  0803 Patient care turned over to oncoming provider. Patient case and results discussed in detail; please see their note for further ED managment.    [PC]    Clinical Course User Index [PC] Kitzia Camus, Amadeo Garnet, MD    Final Clinical Impression(s) / ED Diagnoses Final diagnoses:  None    This chart was dictated using voice recognition software.  Despite best efforts to proofread,  errors can occur which can change the documentation meaning.    Nira Conn, MD 12/02/22 [phone]

## 2022-12-02 NOTE — Telephone Encounter (Signed)
Pt stated that he requested a sooner appointment after recent visit to the hospital for abdominal pain for his gallbladder. Pt was scheduled fro 12/04/2022 at 9:10 AM to see Dr. Leone Payor. Pt made aware. Pt verbalized understanding with all questions answered.

## 2022-12-02 NOTE — Discharge Instructions (Addendum)
You were seen in the emergency room for abdominal discomfort.  Read the instructions provided. The workup in the emergency room comprised of labs which are reassuring.  You indicated that the pain was not as severe as last time when we had discovered that you had cholelithiasis.  At your request, we did not proceed with getting ultrasound emergently in the ER.  That being said, if you start having worsening pain, nausea, vomiting, fevers, chills then please return to the ER immediately.  We recommend that you follow-up with your PCP as planned on Thursday and also consider calling your general surgeons for repeat evaluation.  It might be beneficial to get an ultrasound before you are seen by the surgery team -and if your symptoms are manageable then PCP can order outpatient ultrasound.

## 2022-12-02 NOTE — ED Provider Notes (Addendum)
  Physical Exam  BP (!) 201/107   Pulse 91   Temp 97.7 F (36.5 C) (Oral)   Resp 19   Ht 6\' 3"  (1.905 m)   Wt 105.2 kg   SpO2 95%   BMI 29.00 kg/m   Physical Exam  Procedures  Procedures  ED Course / MDM   Clinical Course as of 12/02/22 0936  Tue Dec 02, 2022  0701 Upper abdominal/right upper quadrant pain workup for differential diagnosis listed below  CBC without leukocytosis or anemia.  Metabolic panel without significant electrolyte derangements or renal sufficiency. hyperglycemia without DKA.  No evidence of bili obstruction or pancreatitis.  Less suspicious for serious intra-abdominal inflammatory/infectious process or bowel obstruction requiring advanced imaging at this time.  Patient reported improved pain following IV Dilaudid and Zofran.  Still having some discomfort.  Will provide patient with GI cocktail  [PC]  0803 Patient care turned over to oncoming provider. Patient case and results discussed in detail; please see their note for further ED managment.    [PC]    Clinical Course User Index [PC] Cardama, Amadeo Garnet, MD   Medical Decision Making Amount and/or Complexity of Data Reviewed Labs: ordered.  Risk OTC drugs. Prescription drug management.   Pt comes in with cc of epigastric pain. Plan is to reassess patient after GI cocktail. If pain continues - consider hepatobiliary workup. Labs are reassuring.     Derwood Kaplan, MD 12/02/22 [phone]  I reassessed the patient around 8 AM.  He had negative Murphy's and no right upper quadrant abdominal tenderness.  Patient stated that the pain was excruciating the last time he needed come to the ER.  This time the pain was never as intense.  The pain had lasted for about 5 hours during my assessment.  Patient received GI cocktail.  I reassessed him around 9:30 AM.  He indicated that he is comfortable going home at this time.  He has not had worsening of the abdominal pain, but the pain is still present in  a nagging fashion.  I informed him that we can consider getting an ultrasound to ensure there is no evidence of cholecystitis.  Patient does not think that is necessary at this time.  He informed me that last time he received antibiotics and his symptoms improved.  I will put him on Augmentin given the ambiguity in this case and patient being high risk for decompensation given his age and comorbidities.  We have discussed strict ER return precautions.      Derwood Kaplan, MD 12/02/22 [phone]

## 2022-12-02 NOTE — ED Triage Notes (Signed)
  Patient comes in with epigastric pain and nausea that woke him up from sleep around 0100 this morning.  Patient states it feels similar to gallbladder attack he had back in 03/24.  Patient describes pain as intermittent sharp/stabbing pain in epigastric area, and RUQ.  Endorses nausea with no vomiting.  Pain 6/10, sharp/stabbing.

## 2022-12-03 ENCOUNTER — Emergency Department (HOSPITAL_COMMUNITY): Payer: Medicare Other

## 2022-12-03 ENCOUNTER — Inpatient Hospital Stay (HOSPITAL_COMMUNITY)
Admission: EM | Admit: 2022-12-03 | Discharge: 2022-12-10 | DRG: 871 | Disposition: A | Discharge status: Home Health | Payer: Medicare Other | Attending: Internal Medicine | Admitting: Internal Medicine

## 2022-12-03 ENCOUNTER — Telehealth: Payer: Self-pay | Admitting: Internal Medicine

## 2022-12-03 ENCOUNTER — Other Ambulatory Visit: Payer: Self-pay

## 2022-12-03 DIAGNOSIS — B961 Klebsiella pneumoniae [K. pneumoniae] as the cause of diseases classified elsewhere: Secondary | ICD-10-CM | POA: Diagnosis present

## 2022-12-03 DIAGNOSIS — K766 Portal hypertension: Secondary | ICD-10-CM | POA: Diagnosis present

## 2022-12-03 DIAGNOSIS — E785 Hyperlipidemia, unspecified: Secondary | ICD-10-CM | POA: Diagnosis present

## 2022-12-03 DIAGNOSIS — E1169 Type 2 diabetes mellitus with other specified complication: Secondary | ICD-10-CM | POA: Diagnosis present

## 2022-12-03 DIAGNOSIS — A4151 Sepsis due to Escherichia coli [E. coli]: Secondary | ICD-10-CM | POA: Diagnosis present

## 2022-12-03 DIAGNOSIS — I5032 Chronic diastolic (congestive) heart failure: Secondary | ICD-10-CM | POA: Diagnosis present

## 2022-12-03 DIAGNOSIS — A419 Sepsis, unspecified organism: Secondary | ICD-10-CM

## 2022-12-03 DIAGNOSIS — Z91148 Patient's other noncompliance with medication regimen for other reason: Secondary | ICD-10-CM

## 2022-12-03 DIAGNOSIS — N1831 Chronic kidney disease, stage 3a: Secondary | ICD-10-CM | POA: Diagnosis present

## 2022-12-03 DIAGNOSIS — I130 Hypertensive heart and chronic kidney disease with heart failure and stage 1 through stage 4 chronic kidney disease, or unspecified chronic kidney disease: Secondary | ICD-10-CM | POA: Diagnosis present

## 2022-12-03 DIAGNOSIS — E119 Type 2 diabetes mellitus without complications: Secondary | ICD-10-CM | POA: Diagnosis not present

## 2022-12-03 DIAGNOSIS — K819 Cholecystitis, unspecified: Secondary | ICD-10-CM | POA: Diagnosis not present

## 2022-12-03 DIAGNOSIS — Z8546 Personal history of malignant neoplasm of prostate: Secondary | ICD-10-CM

## 2022-12-03 DIAGNOSIS — E669 Obesity, unspecified: Secondary | ICD-10-CM | POA: Diagnosis present

## 2022-12-03 DIAGNOSIS — R0602 Shortness of breath: Secondary | ICD-10-CM | POA: Diagnosis not present

## 2022-12-03 DIAGNOSIS — J309 Allergic rhinitis, unspecified: Secondary | ICD-10-CM | POA: Diagnosis present

## 2022-12-03 DIAGNOSIS — K7689 Other specified diseases of liver: Secondary | ICD-10-CM | POA: Diagnosis not present

## 2022-12-03 DIAGNOSIS — R109 Unspecified abdominal pain: Secondary | ICD-10-CM | POA: Diagnosis not present

## 2022-12-03 DIAGNOSIS — K219 Gastro-esophageal reflux disease without esophagitis: Secondary | ICD-10-CM | POA: Diagnosis present

## 2022-12-03 DIAGNOSIS — Z794 Long term (current) use of insulin: Secondary | ICD-10-CM | POA: Diagnosis not present

## 2022-12-03 DIAGNOSIS — Z7984 Long term (current) use of oral hypoglycemic drugs: Secondary | ICD-10-CM

## 2022-12-03 DIAGNOSIS — R1084 Generalized abdominal pain: Secondary | ICD-10-CM | POA: Diagnosis not present

## 2022-12-03 DIAGNOSIS — R188 Other ascites: Secondary | ICD-10-CM | POA: Diagnosis present

## 2022-12-03 DIAGNOSIS — Z1152 Encounter for screening for COVID-19: Secondary | ICD-10-CM | POA: Diagnosis not present

## 2022-12-03 DIAGNOSIS — L89321 Pressure ulcer of left buttock, stage 1: Secondary | ICD-10-CM | POA: Diagnosis present

## 2022-12-03 DIAGNOSIS — R0902 Hypoxemia: Secondary | ICD-10-CM | POA: Diagnosis not present

## 2022-12-03 DIAGNOSIS — D696 Thrombocytopenia, unspecified: Secondary | ICD-10-CM

## 2022-12-03 DIAGNOSIS — K8001 Calculus of gallbladder with acute cholecystitis with obstruction: Secondary | ICD-10-CM | POA: Diagnosis present

## 2022-12-03 DIAGNOSIS — R000 Tachycardia, unspecified: Secondary | ICD-10-CM | POA: Diagnosis not present

## 2022-12-03 DIAGNOSIS — R6521 Severe sepsis with septic shock: Secondary | ICD-10-CM | POA: Diagnosis present

## 2022-12-03 DIAGNOSIS — K810 Acute cholecystitis: Secondary | ICD-10-CM

## 2022-12-03 DIAGNOSIS — K828 Other specified diseases of gallbladder: Secondary | ICD-10-CM | POA: Diagnosis not present

## 2022-12-03 DIAGNOSIS — R351 Nocturia: Secondary | ICD-10-CM | POA: Diagnosis present

## 2022-12-03 DIAGNOSIS — R7989 Other specified abnormal findings of blood chemistry: Secondary | ICD-10-CM | POA: Diagnosis present

## 2022-12-03 DIAGNOSIS — N521 Erectile dysfunction due to diseases classified elsewhere: Secondary | ICD-10-CM | POA: Diagnosis present

## 2022-12-03 DIAGNOSIS — R4182 Altered mental status, unspecified: Secondary | ICD-10-CM | POA: Diagnosis not present

## 2022-12-03 DIAGNOSIS — E1151 Type 2 diabetes mellitus with diabetic peripheral angiopathy without gangrene: Secondary | ICD-10-CM | POA: Diagnosis present

## 2022-12-03 DIAGNOSIS — C9110 Chronic lymphocytic leukemia of B-cell type not having achieved remission: Secondary | ICD-10-CM | POA: Diagnosis present

## 2022-12-03 DIAGNOSIS — R0989 Other specified symptoms and signs involving the circulatory and respiratory systems: Secondary | ICD-10-CM | POA: Diagnosis not present

## 2022-12-03 DIAGNOSIS — N401 Enlarged prostate with lower urinary tract symptoms: Secondary | ICD-10-CM | POA: Diagnosis present

## 2022-12-03 DIAGNOSIS — E1122 Type 2 diabetes mellitus with diabetic chronic kidney disease: Secondary | ICD-10-CM | POA: Diagnosis present

## 2022-12-03 DIAGNOSIS — I480 Paroxysmal atrial fibrillation: Secondary | ICD-10-CM | POA: Diagnosis present

## 2022-12-03 DIAGNOSIS — R1011 Right upper quadrant pain: Secondary | ICD-10-CM | POA: Diagnosis not present

## 2022-12-03 DIAGNOSIS — F05 Delirium due to known physiological condition: Secondary | ICD-10-CM | POA: Diagnosis not present

## 2022-12-03 DIAGNOSIS — Z6830 Body mass index (BMI) 30.0-30.9, adult: Secondary | ICD-10-CM

## 2022-12-03 DIAGNOSIS — Z8616 Personal history of COVID-19: Secondary | ICD-10-CM

## 2022-12-03 DIAGNOSIS — Z434 Encounter for attention to other artificial openings of digestive tract: Secondary | ICD-10-CM | POA: Diagnosis not present

## 2022-12-03 DIAGNOSIS — D6959 Other secondary thrombocytopenia: Secondary | ICD-10-CM | POA: Diagnosis present

## 2022-12-03 DIAGNOSIS — G9341 Metabolic encephalopathy: Secondary | ICD-10-CM | POA: Diagnosis present

## 2022-12-03 DIAGNOSIS — E872 Acidosis, unspecified: Secondary | ICD-10-CM | POA: Diagnosis present

## 2022-12-03 DIAGNOSIS — N179 Acute kidney failure, unspecified: Secondary | ICD-10-CM

## 2022-12-03 DIAGNOSIS — K7030 Alcoholic cirrhosis of liver without ascites: Secondary | ICD-10-CM | POA: Diagnosis present

## 2022-12-03 DIAGNOSIS — R0600 Dyspnea, unspecified: Secondary | ICD-10-CM | POA: Diagnosis not present

## 2022-12-03 DIAGNOSIS — Z888 Allergy status to other drugs, medicaments and biological substances status: Secondary | ICD-10-CM

## 2022-12-03 DIAGNOSIS — E1142 Type 2 diabetes mellitus with diabetic polyneuropathy: Secondary | ICD-10-CM | POA: Diagnosis present

## 2022-12-03 DIAGNOSIS — Z79899 Other long term (current) drug therapy: Secondary | ICD-10-CM

## 2022-12-03 DIAGNOSIS — R0689 Other abnormalities of breathing: Secondary | ICD-10-CM | POA: Diagnosis not present

## 2022-12-03 DIAGNOSIS — L89311 Pressure ulcer of right buttock, stage 1: Secondary | ICD-10-CM | POA: Diagnosis present

## 2022-12-03 DIAGNOSIS — I10 Essential (primary) hypertension: Secondary | ICD-10-CM | POA: Diagnosis not present

## 2022-12-03 DIAGNOSIS — Z8711 Personal history of peptic ulcer disease: Secondary | ICD-10-CM

## 2022-12-03 DIAGNOSIS — M170 Bilateral primary osteoarthritis of knee: Secondary | ICD-10-CM | POA: Diagnosis present

## 2022-12-03 DIAGNOSIS — K5730 Diverticulosis of large intestine without perforation or abscess without bleeding: Secondary | ICD-10-CM | POA: Diagnosis not present

## 2022-12-03 DIAGNOSIS — F40241 Acrophobia: Secondary | ICD-10-CM | POA: Diagnosis present

## 2022-12-03 DIAGNOSIS — M109 Gout, unspecified: Secondary | ICD-10-CM | POA: Diagnosis present

## 2022-12-03 DIAGNOSIS — E876 Hypokalemia: Secondary | ICD-10-CM | POA: Diagnosis present

## 2022-12-03 DIAGNOSIS — Z87891 Personal history of nicotine dependence: Secondary | ICD-10-CM

## 2022-12-03 DIAGNOSIS — J9811 Atelectasis: Secondary | ICD-10-CM | POA: Diagnosis present

## 2022-12-03 DIAGNOSIS — E519 Thiamine deficiency, unspecified: Secondary | ICD-10-CM | POA: Diagnosis present

## 2022-12-03 DIAGNOSIS — R161 Splenomegaly, not elsewhere classified: Secondary | ICD-10-CM | POA: Diagnosis present

## 2022-12-03 DIAGNOSIS — Z781 Physical restraint status: Secondary | ICD-10-CM

## 2022-12-03 DIAGNOSIS — K769 Liver disease, unspecified: Secondary | ICD-10-CM | POA: Diagnosis not present

## 2022-12-03 DIAGNOSIS — I959 Hypotension, unspecified: Secondary | ICD-10-CM | POA: Diagnosis not present

## 2022-12-03 DIAGNOSIS — Z806 Family history of leukemia: Secondary | ICD-10-CM

## 2022-12-03 DIAGNOSIS — R918 Other nonspecific abnormal finding of lung field: Secondary | ICD-10-CM | POA: Diagnosis not present

## 2022-12-03 DIAGNOSIS — G4700 Insomnia, unspecified: Secondary | ICD-10-CM | POA: Diagnosis present

## 2022-12-03 DIAGNOSIS — I456 Pre-excitation syndrome: Secondary | ICD-10-CM | POA: Diagnosis present

## 2022-12-03 DIAGNOSIS — K8020 Calculus of gallbladder without cholecystitis without obstruction: Secondary | ICD-10-CM | POA: Diagnosis not present

## 2022-12-03 DIAGNOSIS — K7460 Unspecified cirrhosis of liver: Secondary | ICD-10-CM | POA: Diagnosis not present

## 2022-12-03 DIAGNOSIS — R509 Fever, unspecified: Secondary | ICD-10-CM | POA: Diagnosis not present

## 2022-12-03 DIAGNOSIS — R059 Cough, unspecified: Secondary | ICD-10-CM | POA: Diagnosis not present

## 2022-12-03 DIAGNOSIS — E1165 Type 2 diabetes mellitus with hyperglycemia: Secondary | ICD-10-CM | POA: Diagnosis present

## 2022-12-03 LAB — RESP PANEL BY RT-PCR (RSV, FLU A&B, COVID)  RVPGX2
Influenza A by PCR: NEGATIVE
Influenza B by PCR: NEGATIVE
Resp Syncytial Virus by PCR: NEGATIVE
SARS Coronavirus 2 by RT PCR: NEGATIVE

## 2022-12-03 LAB — CBC WITH DIFFERENTIAL/PLATELET
Abs Immature Granulocytes: 0.1 10*3/uL — ABNORMAL HIGH (ref 0.00–0.07)
Band Neutrophils: 9 %
Basophils Absolute: 0 10*3/uL (ref 0.0–0.1)
Basophils Relative: 0 %
Eosinophils Absolute: 0 10*3/uL (ref 0.0–0.5)
Eosinophils Relative: 0 %
HCT: 41.8 % (ref 39.0–52.0)
Hemoglobin: 14.7 g/dL (ref 13.0–17.0)
Lymphocytes Relative: 31 %
Lymphs Abs: 0.9 10*3/uL (ref 0.7–4.0)
MCH: 33.7 pg (ref 26.0–34.0)
MCHC: 35.2 g/dL (ref 30.0–36.0)
MCV: 95.9 fL (ref 80.0–100.0)
Metamyelocytes Relative: 1 %
Monocytes Absolute: 0.1 10*3/uL (ref 0.1–1.0)
Monocytes Relative: 2 %
Myelocytes: 1 %
Neutro Abs: 1.9 10*3/uL (ref 1.7–7.7)
Neutrophils Relative %: 56 %
Platelets: 58 10*3/uL — ABNORMAL LOW (ref 150–400)
RBC: 4.36 MIL/uL (ref 4.22–5.81)
RDW: 14.1 % (ref 11.5–15.5)
WBC: 2.9 10*3/uL — ABNORMAL LOW (ref 4.0–10.5)
nRBC: 0 % (ref 0.0–0.2)

## 2022-12-03 LAB — I-STAT CG4 LACTIC ACID, ED
Lactic Acid, Venous: 9.8 mmol/L (ref 0.5–1.9)
Lactic Acid, Venous: 6 mmol/L (ref 0.5–1.9)

## 2022-12-03 LAB — COMPREHENSIVE METABOLIC PANEL
ALT: 22 U/L (ref 0–44)
AST: 36 U/L (ref 15–41)
Albumin: 3 g/dL — ABNORMAL LOW (ref 3.5–5.0)
Alkaline Phosphatase: 208 U/L — ABNORMAL HIGH (ref 38–126)
Anion gap: 19 — ABNORMAL HIGH (ref 5–15)
BUN: 27 mg/dL — ABNORMAL HIGH (ref 8–23)
CO2: 14 mmol/L — ABNORMAL LOW (ref 22–32)
Calcium: 8.1 mg/dL — ABNORMAL LOW (ref 8.9–10.3)
Chloride: 103 mmol/L (ref 98–111)
Creatinine, Ser: 1.88 mg/dL — ABNORMAL HIGH (ref 0.61–1.24)
GFR, Estimated: 36 mL/min — ABNORMAL LOW (ref >60)
Glucose, Bld: 192 mg/dL — ABNORMAL HIGH (ref 70–99)
Potassium: 3.9 mmol/L (ref 3.5–5.1)
Sodium: 136 mmol/L (ref 135–145)
Total Bilirubin: 2.7 mg/dL — ABNORMAL HIGH (ref 0.3–1.2)
Total Protein: 5.5 g/dL — ABNORMAL LOW (ref 6.5–8.1)

## 2022-12-03 LAB — APTT: aPTT: 32 s (ref 24–36)

## 2022-12-03 LAB — GLUCOSE, CAPILLARY: Glucose-Capillary: 159 mg/dL — ABNORMAL HIGH (ref 70–99)

## 2022-12-03 LAB — PROTIME-INR
INR: 1.5 — ABNORMAL HIGH (ref 0.8–1.2)
Prothrombin Time: 18 s — ABNORMAL HIGH (ref 11.4–15.2)

## 2022-12-03 LAB — CBG MONITORING, ED: Glucose-Capillary: 176 mg/dL — ABNORMAL HIGH (ref 70–99)

## 2022-12-03 MED ORDER — POLYETHYLENE GLYCOL 3350 17 G PO PACK: 17.0000 g | PACK | Freq: Every day | ORAL | Status: DC | PRN

## 2022-12-03 MED ORDER — METRONIDAZOLE 500 MG/100ML IV SOLN
500.0000 mg | Freq: Once | INTRAVENOUS | Status: AC
  Administered 2022-12-03: 500 mg via INTRAVENOUS
  Filled 2022-12-03: qty 100

## 2022-12-03 MED ORDER — ONDANSETRON HCL 4 MG/2ML IJ SOLN: 4.0000 mg | Freq: Four times a day (QID) | INTRAMUSCULAR | Status: DC | PRN

## 2022-12-03 MED ORDER — VANCOMYCIN HCL IN DEXTROSE 1-5 GM/200ML-% IV SOLN: 1000.0000 mg | Freq: Once | INTRAVENOUS | Status: DC

## 2022-12-03 MED ORDER — LACTATED RINGERS IV BOLUS (SEPSIS)
500.0000 mL | Freq: Once | INTRAVENOUS | Status: AC
  Administered 2022-12-03: 500 mL via INTRAVENOUS

## 2022-12-03 MED ORDER — DOCUSATE SODIUM 100 MG PO CAPS: 100.0000 mg | ORAL_CAPSULE | Freq: Two times a day (BID) | ORAL | Status: DC | PRN

## 2022-12-03 MED ORDER — INSULIN ASPART 100 UNIT/ML IJ SOLN
0.0000 [IU] | INTRAMUSCULAR | Status: DC
Start: 2022-12-04 — End: 2022-12-10
  Administered 2022-12-04 (×2): 2 [IU] via SUBCUTANEOUS
  Administered 2022-12-04: 3 [IU] via SUBCUTANEOUS
  Administered 2022-12-04 (×2): 2 [IU] via SUBCUTANEOUS
  Administered 2022-12-04: 3 [IU] via SUBCUTANEOUS
  Administered 2022-12-05: 2 [IU] via SUBCUTANEOUS
  Administered 2022-12-06 (×2): 3 [IU] via SUBCUTANEOUS
  Administered 2022-12-06: 2 [IU] via SUBCUTANEOUS
  Administered 2022-12-06 – 2022-12-07 (×5): 3 [IU] via SUBCUTANEOUS
  Administered 2022-12-07 (×2): 2 [IU] via SUBCUTANEOUS
  Administered 2022-12-07 – 2022-12-08 (×3): 3 [IU] via SUBCUTANEOUS
  Administered 2022-12-08 (×4): 2 [IU] via SUBCUTANEOUS
  Administered 2022-12-09: 3 [IU] via SUBCUTANEOUS
  Administered 2022-12-09 (×3): 2 [IU] via SUBCUTANEOUS
  Administered 2022-12-09: 3 [IU] via SUBCUTANEOUS
  Administered 2022-12-09: 2 [IU] via SUBCUTANEOUS
  Administered 2022-12-10: 3 [IU] via SUBCUTANEOUS
  Administered 2022-12-10: 2 [IU] via SUBCUTANEOUS

## 2022-12-03 MED ORDER — SODIUM CHLORIDE 0.9 % IV SOLN
2.0000 g | Freq: Once | INTRAVENOUS | Status: AC
  Administered 2022-12-03: 2 g via INTRAVENOUS
  Filled 2022-12-03: qty 12.5

## 2022-12-03 MED ORDER — PIPERACILLIN-TAZOBACTAM 3.375 G IVPB
3.3750 g | Freq: Three times a day (TID) | INTRAVENOUS | Status: DC
  Administered 2022-12-04: 3.375 g via INTRAVENOUS
  Filled 2022-12-03 (×2): qty 50

## 2022-12-03 MED ORDER — SODIUM CHLORIDE 0.9 % IV SOLN
250.0000 mL | INTRAVENOUS | Status: DC
  Administered 2022-12-04: 250 mL via INTRAVENOUS

## 2022-12-03 MED ORDER — LACTATED RINGERS IV SOLN: INTRAVENOUS | Status: DC

## 2022-12-03 MED ORDER — VANCOMYCIN VARIABLE DOSE PER UNSTABLE RENAL FUNCTION (PHARMACIST DOSING): Status: DC

## 2022-12-03 MED ORDER — LACTATED RINGERS IV SOLN
INTRAVENOUS | Status: DC
Start: 2022-12-03 — End: 2022-12-04

## 2022-12-03 MED ORDER — SODIUM CHLORIDE 0.9 % IV SOLN
2500.0000 mg | Freq: Once | INTRAVENOUS | Status: AC
  Administered 2022-12-03: 2500 mg via INTRAVENOUS
  Filled 2022-12-03: qty 2500

## 2022-12-03 MED ORDER — NOREPINEPHRINE 4 MG/250ML-% IV SOLN
2.0000 ug/min | INTRAVENOUS | Status: DC
  Administered 2022-12-04: 2 ug/min via INTRAVENOUS
  Filled 2022-12-03: qty 250

## 2022-12-03 MED ORDER — IOHEXOL 350 MG/ML SOLN
75.0000 mL | Freq: Once | INTRAVENOUS | Status: AC | PRN
  Administered 2022-12-03: 75 mL via INTRAVENOUS

## 2022-12-03 MED ORDER — ORAL CARE MOUTH RINSE: 15.0000 mL | OROMUCOSAL | Status: DC | PRN

## 2022-12-03 MED ORDER — LACTATED RINGERS IV BOLUS (SEPSIS)
1000.0000 mL | Freq: Once | INTRAVENOUS | Status: AC
  Administered 2022-12-03: 1000 mL via INTRAVENOUS

## 2022-12-03 MED ORDER — HYDROCODONE-ACETAMINOPHEN 5-325 MG PO TABS: 1.0000 | ORAL_TABLET | Freq: Four times a day (QID) | ORAL | 0 refills | Status: DC | PRN

## 2022-12-03 NOTE — Telephone Encounter (Signed)
Pt made aware with a detailed voice message  that prescription has been sent to his pharmacy.  Pt notified that If he has fever, persistent pain, vomiting needs to go to ED

## 2022-12-03 NOTE — Telephone Encounter (Signed)
Inbound call from patient stating that he had a gallbladder attack 2 days ago and is wanting to see if Dr. Leone Payor will give him some pain pills until tomorrow at his appointment at 9:00 . Please advise.

## 2022-12-03 NOTE — H&P (Signed)
NAME:  Patrick Kidd, MRN:  [id number], DOB:  26-Jan-1946, LOS: 0 ADMISSION DATE:  12/03/2022, CONSULTATION DATE:  12/03/22 REFERRING MD:  Rhae Hammock CHIEF COMPLAINT:  Chills   History of Present Illness:  Patrick Kidd is a 77 y.o. male who has a PMH as below including but not limited to EtOH cirrhosis well controlled and no longer drinking, PUD requiring cautery in the past, DM, cholecystitis. He had admission back in April 2024 for acute calculous cholecystitis and E.coli bacteremia. He was deemed to be at increased risk for surgery; therefore, he was treated conservatively with abx with outpatient GI follow up. He did well following that admission with no issues.  On 10/29, he began to have RUQ pain that prompted an ED visit to [facility]. He felt improved with nausea meds, pain meds, GI cocktail and refused imaging at the time and asked to be discharged home.  On 10/30, he was doing well besides some pain. He called Dr. Chiquita Loth with GI for an Rx for pain pills that could get him through until his follow up appointment which was scheduled for 10/31. Around 1600 he began to have chills. These got worse so he came back to ED.  In ED, he was hypotensive and shocky. He received 3L NS with some improvement in BP during 3rd liter. He also had AKI, lactic acidosis, leukopenia, thrombocytopenia. CT A/P demonstrated acute cholecystitis. Given his constellation of lab abnormalities and concern that his pressure would deteriorate further, PCCM was called for admission to ICU.  He denies any pain or fevers. Did have chills earlier but now resolved. Also had nausea but now resolved. Denies any vomiting. Has associated dyspnea and has required 4L O2 via [place] while in ED.  Pertinent  Medical History:  has GERD; Peripheral neuropathy; Polyclonal gammopathy; Hypersomnolence; Type 2 diabetes mellitus with complication, with long-term current use of insulin (HCC); Alcoholic cirrhosis (HCC); Peripheral vascular disease  (HCC); Erectile dysfunction due to arterial insufficiency; Hyperlipidemia LDL goal <100; Essential hypertension; BPH associated with nocturia; Malignant neoplasm of prostate (HCC); OAB (overactive bladder); Cervical radiculopathy; Degenerative disc disease, cervical; SBP (spontaneous bacterial peritonitis) (HCC)-December 2020, ; Encephalopathy, hepatic (HCC); WPW (Wolff-Parkinson-White syndrome); Overweight with body mass index (BMI) of 28 to 28.9 in adult; Allergic rhinitis; Erectile dysfunction due to diabetes mellitus (HCC); Paroxysmal atrial fibrillation (HCC); Thiamine deficiency; Stage 3a chronic kidney disease (HCC); Insulin-requiring or dependent type II diabetes mellitus (HCC); Chronic lymphocytic leukemia (HCC); Degenerative arthritis of knee, bilateral; Degenerative lumbar spinal stenosis; Urinary incontinence; Sludge in gallbladder; Hyperbilirubinemia; E coli bacteremia; and Septic shock (HCC) on their problem list.  Significant Hospital Events: Including procedures, antibiotic start and stop dates in addition to other pertinent events   10/30 admit  Interim History / Subjective:  Comfortable currently. Denies pain, nausea. On 4L O2 via [place]. Actually asking to go home again.  Objective:  Blood pressure 103/61, pulse (!) 116, temperature 99.8 F (37.7 C), temperature source Oral, resp. rate (!) 26, weight 105 kg, SpO2 95%.        Intake/Output Summary (Last 24 hours) at 12/03/2022 2204 Last data filed at 12/03/2022 2131 Gross per 24 hour  Intake 2200 ml  Output --  Net 2200 ml   Filed Weights   12/03/22 1841  Weight: 105 kg    Examination: General: Adult male, resting in bed, in NAD. Neuro: A&O x 3, no deficits. HEENT: [place]/AT. Sclerae anicteric. EOMI. MM dry. Cardiovascular: Tachy, regular, no M/R/G.  Lungs: Respirations even and unlabored.  CTA bilaterally, No W/R/R. Abdomen: Obese. BS x 4, soft. Tenderness in RUQ. Musculoskeletal: No gross deformities, no edema.  Skin:  Intact, warm, no rashes.   Labs/imaging personally reviewed:  CT A/P 10/30 > cirrhosis, small ascites, distended GB with multiple stones, small right pleural effusion.  Assessment & Plan:   Acute calculous cholecystitis, recurrent - last episode in April 2024 managed conservatively with abx alone with surgery deferred. - Empiric Vanc/Zosyn. - Day team to please consult IR for PCN tube as well as surgery as likely need to revisit decision on surgery with risk/benefit given recurrence of cholecystitis just 6 months after prior episode. - Day team to also please consult GI (sees Dr. Chiquita Loth as an outpatient).  Septic shock - 2/2 above. - Continue fluids, he still seems a bit dry. - Start NE if needed to maintain goal MAP > 65. - Empiric Vanc/Zosyn. - Follow cultures. - Repeat lactate after 3rd liter of fluids is complete.  AKI. AGMA - 2/2 renal failure + lactate (lactic acidosis also exacerbated by Metformin use). - Continue fluids. - Repeat lactate. - Follow BMP.  Small R pleural effusion and atelectasis. - Supportive care.  Leukopenia and thrombocytopenia - presumed 2/2 sepsis. Does have hx of CLL; however, labs have been WNL in the past. - Supportive care as above. - No VTE chemoprophylaxis, SCD's only.  Hx DM. - SSI. - Hold PTA Insulin, Metformin.  Hx EtOH cirrhosis, PUD, Diverticulosis, GERD. - F/u as an outpatient.  Hx CLL - on observation, followed by Dr. Leonides Schanz. - F/u as an outpatient.  Best practice (evaluated daily):  Diet/type: NPO after midnight. DVT prophylaxis: SCD, no chemoprophylaxis given thrombocytopenia. GI prophylaxis: N/A Lines: N/A Foley:  N/A Code Status:  full code Last date of multidisciplinary goals of care discussion: None yet.  Labs   CBC: Recent Labs  Lab 12/02/22 0446 12/03/22 1850  WBC 7.6 2.9*  NEUTROABS 4.3 1.9  HGB 15.6 14.7  HCT 44.6 41.8  MCV 94.5 95.9  PLT 84* 58*    Basic Metabolic Panel: Recent Labs  Lab  12/02/22 0446 12/03/22 1850  NA 139 136  K 4.4 3.9  CL 105 103  CO2 22 14*  GLUCOSE 272* 192*  BUN 18 27*  CREATININE 0.99 1.88*  CALCIUM 9.2 8.1*   GFR: Estimated Creatinine Clearance: 42.5 mL/min (A) (by C-G formula based on SCr of 1.88 mg/dL (H)). Recent Labs  Lab 12/02/22 0446 12/03/22 1850 12/03/22 1902  WBC 7.6 2.9*  --   LATICACIDVEN  --   --  9.8*    Liver Function Tests: Recent Labs  Lab 12/02/22 0446 12/03/22 1850  AST 24 36  ALT 14 22  ALKPHOS 76 208*  BILITOT 1.1 2.7*  PROT 7.4 5.5*  ALBUMIN 4.2 3.0*   Recent Labs  Lab 12/02/22 0446  LIPASE 41   No results for input(s): "AMMONIA" in the last 168 hours.  ABG No results found for: "PHART", "PCO2ART", "PO2ART", "HCO3", "TCO2", "ACIDBASEDEF", "O2SAT"   Coagulation Profile: Recent Labs  Lab 12/03/22 1850  INR 1.5*    Cardiac Enzymes: No results for input(s): "CKTOTAL", "CKMB", "CKMBINDEX", "TROPONINI" in the last 168 hours.  HbA1C: Hgb A1c MFr Bld  Date/Time Value Ref Range Status  11/19/2022 10:52 AM 6.7 (H) 4.8 - 5.6 % Final    Comment:    (NOTE) Pre diabetes:          5.7%-6.4%  Diabetes:              >  6.4%  Glycemic control for   <7.0% adults with diabetes   10/21/2022 01:59 PM 7.4 (H) 4.6 - 6.5 % Final    Comment:    Glycemic Control Guidelines for People with Diabetes:Non Diabetic:  <6%Goal of Therapy: <7%Additional Action Suggested:  >8%     CBG: Recent Labs  Lab 12/03/22 1837  GLUCAP 176*    Review of Systems:   All negative; except for those that are bolded, which indicate positives.  Constitutional: weight loss, weight gain, night sweats, fevers, chills, fatigue, weakness.  HEENT: headaches, sore throat, sneezing, nasal congestion, post nasal drip, difficulty swallowing, tooth/dental problems, visual complaints, visual changes, ear aches. Neuro: difficulty with speech, weakness, numbness, ataxia. CV:  chest pain, orthopnea, PND, swelling in lower extremities,  dizziness, palpitations, syncope.  Resp: cough, hemoptysis, dyspnea, wheezing. GI: heartburn, indigestion, abdominal pain, nausea, vomiting, diarrhea, constipation, change in bowel habits, loss of appetite, hematemesis, melena, hematochezia.  GU: dysuria, change in color of urine, urgency or frequency, flank pain, hematuria. MSK: joint pain or swelling, decreased range of motion. Psych: change in mood or affect, depression, anxiety, suicidal ideations, homicidal ideations. Skin: rash, itching, bruising.   Past Medical History:  He,  has a past medical history of Acrophobia, Alcoholic cirrhosis (HCC) (10/04/2015), Anemia, Arthritis, BPH associated with nocturia, Cataract, Cholecystitis (05/2022), Chronic cough, CLL (chronic lymphocytic leukemia) (HCC), COVID-19 (12/2018), Diabetes mellitus without complication (HCC), Diverticulosis (07/03/2010), Duodenal ulcer (2017), Fallen arches, GERD (gastroesophageal reflux disease), Gout, Granuloma annulare, adenomatous colonic polyps (multiple), Hydrocele (2011), Liver cyst, Liver lesion, Nonspecific elevation of levels of transaminase or lactic acid dehydrogenase (LDH), Obesity, Peripheral neuropathy, Plantar fasciitis, Portal hypertension (HCC) (2017), Prostate cancer (HCC), Right shoulder pain (11/2017), Sleep apnea, Thiamine deficiency, Wears reading eyeglasses, and WPW (Wolff-Parkinson-White syndrome) (02/05/2019).   Surgical History:   Past Surgical History:  Procedure Laterality Date   BACK SURGERY  04/28/2022   CATARACT EXTRACTION, BILATERAL  12/2011   Dr Nile Riggs   COLONOSCOPY  2017   COLONOSCOPY W/ POLYPECTOMY  07/03/2010   2 adenomas, diverticulosis on right. Dr Leone Payor   CYSTOSCOPY N/A 03/15/2018   Procedure: Derinda Late;  Surgeon: Crista Elliot, MD;  Location: [facility];  Service: Urology;  Laterality: N/A;  NO SEEDS FOUND IN BLADDER   ESOPHAGOGASTRODUODENOSCOPY (EGD) WITH PROPOFOL N/A 01/08/2016   Procedure:  ESOPHAGOGASTRODUODENOSCOPY (EGD) WITH PROPOFOL;  Surgeon: Meryl Dare, MD;  Location: [facility];  Service: Endoscopy;  Laterality: N/A;   FLEXIBLE SIGMOIDOSCOPY  2000   RADIOACTIVE SEED IMPLANT N/A 03/15/2018   Procedure: RADIOACTIVE SEED IMPLANT/BRACHYTHERAPY IMPLANT;  Surgeon: Crista Elliot, MD;  Location: [facility];  Service: Urology;  Laterality: N/A;   73 SEEDS IMPLANTED   SIGMOIDOSCOPY     SPACE OAR INSTILLATION N/A 03/15/2018   Procedure: SPACE OAR INSTILLATION;  Surgeon: Crista Elliot, MD;  Location: [facility];  Service: Urology;  Laterality: N/A;   WISDOM TOOTH EXTRACTION       Social History:   reports that he quit smoking about 51 years ago. His smoking use included cigarettes. He started smoking about 57 years ago. He has a 12 pack-year smoking history. He has never been exposed to tobacco smoke. He has never used smokeless tobacco. He reports that he does not currently use alcohol. He reports that he does not use drugs.   Family History:  His family history includes Leukemia in his father; Lung cancer in his mother. There is no history of Diabetes,  Stroke, Heart disease, Colon cancer, Colon polyps, Esophageal cancer, Rectal cancer, or Stomach cancer.   Allergies Allergies  Allergen Reactions   Rifaximin Nausea And Vomiting    Required EMS visit, although patient did not go to hospital   Beta Adrenergic Blockers     Likely WPW.  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  Use with caution.     Home Medications  Prior to Admission medications   Medication Sig Start Date End Date Taking? Authorizing Provider  acetaminophen (TYLENOL) 500 MG tablet Take 1 tablet (500 mg total) by mouth every 6 (six) hours as needed. 12/02/22   Derwood Kaplan, MD  amoxicillin-clavulanate (AUGMENTIN) 875-125 MG tablet Take 1 tablet by mouth every 12 (twelve) hours. 12/02/22   Derwood Kaplan, MD  Continuous Blood Gluc Receiver  (FREESTYLE LIBRE 3 READER) DEVI 1 Act by Does not apply route daily. 05/07/22   Etta Grandchild, MD  Continuous Blood Gluc Sensor (FREESTYLE LIBRE 3 SENSOR) MISC 1 Act by Does not apply route daily. Place 1 sensor on the skin every 14 days. Use to check glucose continuously 05/07/22   Etta Grandchild, MD  gabapentin (NEURONTIN) 300 MG capsule Take 1 capsule (300 mg total) by mouth 3 (three) times daily. 09/16/22   Drema Dallas, DO  HYDROcodone-acetaminophen (NORCO/VICODIN) 5-325 MG tablet Take 1 tablet by mouth every 6 (six) hours as needed for severe pain (pain score 7-10). 12/03/22   Iva Boop, MD  insulin glargine, 2 Unit Dial, (TOUJEO MAX SOLOSTAR) 300 UNIT/ML Solostar Pen Inject 50 Units into the skin daily. Via SANOFI pt assistance 05/08/22   Etta Grandchild, MD  Insulin Pen Needle (PEN NEEDLES) 32G X 5 MM MISC Inject 1 Act into the skin daily. Use to administer insulin. Dx E11.9 Dispense based on insurance preference. 05/08/22   Etta Grandchild, MD  Lancets (ACCU-CHEK MULTICLIX) lancets Use to check sugar daily. DX: E11.8 12/21/17   Etta Grandchild, MD  metFORMIN (GLUCOPHAGE) 500 MG tablet TAKE 1 TABLET BY MOUTH 2 TIMES DAILY WITH A MEAL. 07/02/22   Etta Grandchild, MD  pantoprazole (PROTONIX) 40 MG tablet Take 1 tablet (40 mg total) by mouth 2 (two) times daily. TAKE 1 TABLET 30 MINUTES BEFORE MEALS (BREAKFAST AND SUPPER) TWICE A DAY 10/24/22   Etta Grandchild, MD     Critical care time: 40 min.   Rutherford Guys, PA - [facility] For pager details, please see AMION or use Epic chat  After 1900, please call [facility] for cross coverage needs 12/03/2022, 10:04 PM

## 2022-12-03 NOTE — Telephone Encounter (Signed)
Pt requesting two pain pills to get him through to his appointment tomorrow AM with Dr. Leone Payor. Pt stated that he has been having severe gallbladder attacks and does not want to have to wake up in the middle of the night with the pain and have to go to the ED.  Please review and advise

## 2022-12-03 NOTE — Progress Notes (Signed)
Pharmacy Antibiotic Note  Patrick Kidd is a 77 y.o. male for which pharmacy has been consulted for vancomycin and zosyn dosing for sepsis.  Patient with a history of GERD, polyclonal gammopathy, T2DM, cirrhosis, PVD, HLD, HTN, BPH. Patient presenting with chills and rigors.  SCr 1.88 - baseline around 1 WBC 2.9; LA 9.8; T 99.8; HR 116; RR 26 COVID neg / flu neg  Plan: Zosyn 3.375g IV q8h (4 hour infusion) Vancomycin 2500 mg once, subsequent dosing as indicated per random vancomycin level until renal function stable and/or improved, at which time scheduled dosing can be considered Monitor WBC, fever, renal function, cultures De-escalate when able  Weight: 105 kg (231 lb 7.7 oz)  Temp (24hrs), Avg:99.8 F (37.7 C), Min:99.8 F (37.7 C), Max:99.8 F (37.7 C)  Recent Labs  Lab 12/02/22 0446 12/03/22 1850 12/03/22 1902  WBC 7.6 2.9*  --   CREATININE 0.99 1.88*  --   LATICACIDVEN  --   --  9.8*    Estimated Creatinine Clearance: 42.5 mL/min (A) (by C-G formula based on SCr of 1.88 mg/dL (H)).    Allergies  Allergen Reactions   Rifaximin Nausea And Vomiting    Required EMS visit, although patient did not go to hospital   Beta Adrenergic Blockers     Likely WPW.  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  Use with caution.    Antimicrobials this admission: Cefepime/flagyl x 1 in ED 10/30 zosyn 10/30 >>  vancomycin 10/30 >>  Microbiology results: Pending  Thank you for allowing pharmacy to be a part of this patient's care.  Delmar Landau, PharmD, BCPS 12/03/2022 10:23 PM ED Clinical Pharmacist -  [phone]

## 2022-12-03 NOTE — Progress Notes (Addendum)
Elink monitoring for the code sepsis protocol.  2237 Notified bedside nurse of need to draw repeat lactic acid. Made aware that Lactic Acid Resulted at 6.0. Issues w/ lab value crossing over to Epic.

## 2022-12-03 NOTE — Progress Notes (Signed)
ED Pharmacy Antibiotic Sign Off An antibiotic consult was received from an ED provider for cefepime and vancomycin per pharmacy dosing for sepsis. A chart review was completed to assess appropriateness.  The following one time order(s) were placed per pharmacy consult:  cefepime 2g IV x 1 dose vancomycin 2500 mg IV x 1 dose  Further antibiotic and/or antibiotic pharmacy consults should be ordered by the admitting provider if indicated.   Thank you for allowing pharmacy to be a part of this patient's care.   Stephenie Acres, PharmD PGY1 Pharmacy Resident 12/03/2022 6:54 PM

## 2022-12-03 NOTE — Telephone Encounter (Signed)
Meds ordered this encounter  Medications   HYDROcodone-acetaminophen (NORCO/VICODIN) 5-325 MG tablet    Sig: Take 1 tablet by mouth every 6 (six) hours as needed for severe pain (pain score 7-10).    Dispense:  5 tablet    Refill:  0     If he has fever, persistent pain, vomiting needs to go to ED

## 2022-12-03 NOTE — ED Provider Notes (Signed)
[place] EMERGENCY DEPARTMENT AT [facility] Provider Note   CSN: [id number] Arrival date & time: 12/03/22  1837     History  Chief Complaint  Patient presents with   Fever    Fever chills and diaphoresis onset around 1600 today. Pt was seen at [facility] yesterday. Hx of cirrhosis of the liver and gallstones. Abd distention noted. Low grade fever pta 100.4. EMS reports pt HR 150 pta and sating 83 percent on RA. Pt given bolus and placed on 3L [place]. Pt systolic 90s to 100s and tachypneic in the 30s pta.    Patrick Kidd is a 77 y.o. male.  77 year old male with past medical history of diabetes, diverticulosis, CLL, duodenal ulcer, and cirrhosis presenting to the emergency department today with fever.  The patient states he started having fevers and chills this afternoon.  He states he was seen in the emergency department yesterday for abdominal pain.  He elected against any imaging at that time.  The patient states that his abdominal discomfort has improved.  He states that today he started feeling short of breath and diaphoretic.  He checked his temperature and it was elevated.  The patient was found to be hypoxic as well as hypotensive with medics.  He was given IV fluids and was brought to the emergency department at time for further evaluation.  He denies any significant cough although he has had some shortness of breath that is gradually worsened over the past few days.  He was placed on Augmentin yesterday.   Fever      Home Medications Prior to Admission medications   Medication Sig Start Date End Date Taking? Authorizing Provider  acetaminophen (TYLENOL) 500 MG tablet Take 1 tablet (500 mg total) by mouth every 6 (six) hours as needed. Patient taking differently: Take 1,000 mg by mouth every 6 (six) hours as needed for moderate pain (pain score 4-6) or mild pain (pain score 1-3). 12/02/22  Yes Derwood Kaplan, MD  amoxicillin-clavulanate (AUGMENTIN) 875-125 MG  tablet Take 1 tablet by mouth every 12 (twelve) hours. 12/02/22  Yes Derwood Kaplan, MD  Continuous Blood Gluc Receiver (FREESTYLE LIBRE 3 READER) DEVI 1 Act by Does not apply route daily. 05/07/22  Yes Etta Grandchild, MD  Continuous Blood Gluc Sensor (FREESTYLE LIBRE 3 SENSOR) MISC 1 Act by Does not apply route daily. Place 1 sensor on the skin every 14 days. Use to check glucose continuously 05/07/22  Yes Etta Grandchild, MD  gabapentin (NEURONTIN) 300 MG capsule Take 1 capsule (300 mg total) by mouth 3 (three) times daily. 09/16/22  Yes Jaffe, Adam R, DO  HYDROcodone-acetaminophen (NORCO/VICODIN) 5-325 MG tablet Take 1 tablet by mouth every 6 (six) hours as needed for severe pain (pain score 7-10). 12/03/22  Yes Iva Boop, MD  insulin glargine, 2 Unit Dial, (TOUJEO MAX SOLOSTAR) 300 UNIT/ML Solostar Pen Inject 50 Units into the skin daily. Via [facility] pt assistance Patient taking differently: Inject 50 Units into the skin daily after breakfast. Via [facility] pt assistance 05/08/22  Yes Etta Grandchild, MD  Insulin Pen Needle (PEN NEEDLES) 32G X 5 MM MISC Inject 1 Act into the skin daily. Use to administer insulin. Dx E11.9 Dispense based on insurance preference. 05/08/22  Yes Etta Grandchild, MD  Lancets (ACCU-CHEK MULTICLIX) lancets Use to check sugar daily. DX: E11.8 12/21/17  Yes Etta Grandchild, MD  metFORMIN (GLUCOPHAGE) 500 MG tablet TAKE 1 TABLET BY MOUTH 2 TIMES DAILY WITH A  MEAL. 07/02/22  Yes Etta Grandchild, MD  Multiple Vitamins-Minerals (CENTRUM SILVER 50+MEN) TABS Take 1 tablet by mouth daily.   Yes [provider]  pantoprazole (PROTONIX) 40 MG tablet Take 1 tablet (40 mg total) by mouth 2 (two) times daily. TAKE 1 TABLET 30 MINUTES BEFORE MEALS (BREAKFAST AND SUPPER) TWICE A DAY 10/24/22  Yes Etta Grandchild, MD      Allergies    Rifaximin, Beta adrenergic blockers, and Calcium channel blockers    Review of Systems   Review of Systems  Constitutional:  Positive for fever.   Respiratory:  Positive for shortness of breath.   Gastrointestinal:  Positive for abdominal pain.  All other systems reviewed and are negative.   Physical Exam Updated Vital Signs BP 103/61   Pulse (!) 116   Temp 99.8 F (37.7 C) (Oral)   Resp (!) 26   Wt 105 kg   SpO2 95%   BMI 28.93 kg/m  Physical Exam Vitals and nursing note reviewed.   Gen: Ill-appearing Eyes: PERRL, EOMI HEENT: no oropharyngeal swelling Neck: trachea midline, no meningismus Resp: Diminished at bilateral lung bases Card: RRR, no murmurs, rubs, or gallops Abd: The patient has mild diffuse tenderness without guarding or rebound.  His abdomen is moderately distended. Extremities: no calf tenderness, no edema Vascular: 2+ radial pulses bilaterally, 2+ DP pulses bilaterally Neuro: No focal deficits Skin: Diaphoretic   ED Results / Procedures / Treatments   Labs (all labs ordered are listed, but only abnormal results are displayed) Labs Reviewed  COMPREHENSIVE METABOLIC PANEL - Abnormal; Notable for the following components:      Result Value   CO2 14 (*)    Glucose, Bld 192 (*)    BUN 27 (*)    Creatinine, Ser 1.88 (*)    Calcium 8.1 (*)    Total Protein 5.5 (*)    Albumin 3.0 (*)    Alkaline Phosphatase 208 (*)    Total Bilirubin 2.7 (*)    GFR, Estimated 36 (*)    Anion gap 19 (*)    All other components within normal limits  CBC WITH DIFFERENTIAL/PLATELET - Abnormal; Notable for the following components:   WBC 2.9 (*)    Platelets 58 (*)    Abs Immature Granulocytes 0.10 (*)    All other components within normal limits  PROTIME-INR - Abnormal; Notable for the following components:   Prothrombin Time 18.0 (*)    INR 1.5 (*)    All other components within normal limits  CBG MONITORING, ED - Abnormal; Notable for the following components:   Glucose-Capillary 176 (*)    All other components within normal limits  I-STAT CG4 LACTIC ACID, ED - Abnormal; Notable for the following components:    Lactic Acid, Venous 9.8 (*)    All other components within normal limits  I-STAT CG4 LACTIC ACID, ED - Abnormal; Notable for the following components:   Lactic Acid, Venous 6.0 (*)    All other components within normal limits  RESP PANEL BY RT-PCR (RSV, FLU A&B, COVID)  RVPGX2  CULTURE, BLOOD (ROUTINE X 2)  CULTURE, BLOOD (ROUTINE X 2)  APTT  URINALYSIS, W/ REFLEX TO CULTURE (INFECTION SUSPECTED)  PATHOLOGIST SMEAR REVIEW  CBC  BASIC METABOLIC PANEL  MAGNESIUM  PHOSPHORUS  LACTIC ACID, PLASMA    EKG EKG Interpretation Date/Time:  Wednesday December 03 2022 18:39:37 EDT Ventricular Rate:  133 PR Interval:  140 QRS Duration:  88 QT Interval:  304 QTC Calculation: 453  R Axis:   43  Text Interpretation: Sinus tachycardia Low voltage, precordial leads Confirmed by Beckey Downing [phone]) on 12/03/2022 7:17:19 PM  Radiology CT ABDOMEN PELVIS W CONTRAST  Result Date: 12/03/2022 CLINICAL DATA:  Abdomen pain EXAM: CT ABDOMEN AND PELVIS WITH CONTRAST TECHNIQUE: Multidetector CT imaging of the abdomen and pelvis was performed using the standard protocol following bolus administration of intravenous contrast. RADIATION DOSE REDUCTION: This exam was performed according to the departmental dose-optimization program which includes automated exposure control, adjustment of the mA and/or kV according to patient size and/or use of iterative reconstruction technique. CONTRAST:  75mL OMNIPAQUE IOHEXOL 350 MG/ML SOLN COMPARISON:  CT 05/30/2022 FINDINGS: Lower chest: Lung bases demonstrate small right-sided pleural effusion. Small right cardio phrenic lymph nodes without significant change. Hepatobiliary: Cirrhotic morphology of the liver. 6.1 cm right hepatic cyst for which no specific imaging follow-up is recommended. Mildly distended gallbladder with multiple stones. Possible gallbladder wall thickening. Trace pericholecystic fluid but there is perihepatic ascites Pancreas: Unremarkable. No pancreatic  ductal dilatation or surrounding inflammatory changes. Spleen: Enlarged at 16 cm. Adrenals/Urinary Tract: Adrenal glands are normal. Kidneys show no hydronephrosis. The bladder is unremarkable. No excretion of contrast on delayed views of the kidneys. Stomach/Bowel: Stomach nonenlarged. No dilated small bowel. Diverticular disease of the colon. Question mild wall thickening and inflammation at the hepatic flexure on coronal series 6, image 123 through 151. Negative appendix. Vascular/Lymphatic: Moderate aortic atherosclerosis. No aneurysm. No suspicious lymph nodes. Reproductive: Multiple prostate seeds. Other: Negative for pelvic effusion or free air. Small volume right upper quadrant ascites Musculoskeletal: No acute or suspicious osseous abnormality. Multilevel degenerative changes IMPRESSION: 1. 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 2. Mildly distended gallbladder with multiple stones. Possible gallbladder wall thickening and trace pericholecystic fluid but there is perihepatic ascites. Correlate clinically for acute cholecystitis. Follow-up ultrasound if deemed clinically appropriate. 3. Diverticular disease of the colon. Question mild wall thickening and inflammation at the hepatic flexure of the colon, as may be seen with mild right-sided diverticulitis. 4. Small right pleural effusion. 5. Aortic atherosclerosis. Aortic Atherosclerosis (ICD10-I70.0). Electronically Signed   By: Jasmine Pang M.D.   On: 12/03/2022 21:15   DG Chest Port 1 View  Result Date: 12/03/2022 CLINICAL DATA:  Shortness of breath and fever EXAM: PORTABLE CHEST 1 VIEW COMPARISON:  01/28/2019 FINDINGS: Low lung volumes. Subsegmental atelectasis at the bases. Stable cardiomediastinal silhouette. No pneumothorax IMPRESSION: Low lung volumes with subsegmental atelectasis at the bases. Electronically Signed   By: Jasmine Pang M.D.   On: 12/03/2022 20:23    Procedures Procedures    Medications  Ordered in ED Medications  0.9 %  sodium chloride infusion (has no administration in time range)  norepinephrine (LEVOPHED) 4mg  in (0.016 mg/mL) premix infusion (0 mcg/min Intravenous Hold 12/03/22 2133)  lactated ringers infusion (has no administration in time range)  docusate sodium (COLACE) capsule 100 mg (has no administration in time range)  polyethylene glycol (MIRALAX / GLYCOLAX) packet 17 g (has no administration in time range)  insulin aspart (novoLOG) injection 0-15 Units (has no administration in time range)  ondansetron (ZOFRAN) injection 4 mg (has no administration in time range)  piperacillin-tazobactam (ZOSYN) IVPB 3.375 g (has no administration in time range)  vancomycin variable dose per unstable renal function (pharmacist dosing) (has no administration in time range)  lactated ringers bolus 1,000 mL (0 mLs Intravenous Stopped 12/03/22 1948)    And  lactated ringers bolus 1,000 mL (0 mLs Intravenous Stopped  12/03/22 2131)    And  lactated ringers bolus 1,000 mL (0 mLs Intravenous Stopped 12/03/22 2232)    And  lactated ringers bolus 500 mL (0 mLs Intravenous Stopped 12/03/22 2256)  ceFEPIme (MAXIPIME) 2 g in sodium chloride 0.9 % 100 mL IVPB (0 g Intravenous Stopped 12/03/22 2025)  metroNIDAZOLE (FLAGYL) IVPB 500 mg (0 mg Intravenous Stopped 12/03/22 2131)  vancomycin (VANCOCIN) 2,500 mg in sodium chloride 0.9 % 500 mL IVPB (0 mg Intravenous Stopped 12/03/22 2256)  iohexol (OMNIPAQUE) 350 MG/ML injection 75 mL (75 mLs Intravenous Contrast Given 12/03/22 1942)    ED Course/ Medical Decision Making/ A&P                                 Medical Decision Making 77 year old male with past medical history of diabetes, diverticulosis, CLL, and duodenal ulcer presents emergency department today with presentation concerning for possible sepsis.  A sepsis workup is initiated including a chest x-ray and urinalysis.  Also obtain a CT scan of his abdomen to evaluate for acute  abdominal pathology here today.  I will give the patient 30 mL/kg for fluids here.  Will cover him empirically with broad-spectrum antibiotics.  He will require admission..  The patient does have a leukocytosis here as well as thrombocytopenia.  His lactic acid is significantly elevated.  The patient's chest x-ray is unremarkable.  CT scan does show findings concerning for possible cholecystitis.  The patient's blood pressures remained low despite fluid resuscitation.  Calls placed to ICU.  I did place patient on peripheral Levophed and he did have improvement in his pressures.  He is admitted to the ICU for further evaluation.  Critical care time 45 minutes including reassessments, review of old records, treatment of septic shock with IV antibiotics, fluids, vasopressors, coordination of care with ICU  Amount and/or Complexity of Data Reviewed Labs: ordered. Radiology: ordered. ECG/medicine tests: ordered.  Risk Prescription drug management. Decision regarding hospitalization.           Final Clinical Impression(s) / ED Diagnoses Final diagnoses:  Cholecystitis  Septic shock [facility])    Rx / DC Orders ED Discharge Orders     None         Durwin Glaze, MD 12/03/22 2313

## 2022-12-03 NOTE — ED Notes (Signed)
ED TO INPATIENT HANDOFF REPORT  ED Nurse Name and Phone #: Francene Castle [phone]   S Name/Age/Gender Patrick Kidd 77 y.o. male Room/Bed: 021C/021C  Code Status   Code Status: Full Code  Home/SNF/Other Home Patient oriented to: self, place, time, and situation Is this baseline? Yes   Triage Complete: Triage complete  Chief Complaint Septic shock (HCC) [A41.9, R65.21]  Triage Note No notes on file   Allergies Allergies  Allergen Reactions   Rifaximin Nausea And Vomiting    Required EMS visit, although patient did not go to hospital   Beta Adrenergic Blockers     Likely WPW.  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  Use with caution.    Level of Care/Admitting Diagnosis ED Disposition     ED Disposition  Admit   Condition  --   Comment  Hospital [facility]: [facility] [[id number]]  Level of Care: ICU [6]  May admit patient to [facility] or [facility] if equivalent level of care is available:: Yes  Covid Evaluation: Asymptomatic - no recent exposure (last 10 days) testing not required  Diagnosis: Septic shock [facility]) [[id number]]  Admitting Physician: PCCM, MD [phone]  Attending Physician: Steffanie Dunn [[id number]]  Certification:: I certify there are rare and unusual circumstances requiring inpatient admission  Expected Medical Readiness: 12/06/2022          B Medical/Surgery History Past Medical History:  Diagnosis Date   Acrophobia    Alcoholic cirrhosis (HCC) 10/04/2015   Anemia    Arthritis    foot by big toe   BPH associated with nocturia    Cataract    removed both eyes   Cholecystitis 05/2022   tx Abx   Chronic cough    PMH of   CLL (chronic lymphocytic leukemia) (HCC)    COVID-19 12/2018   Diabetes mellitus without complication (HCC)    Diverticulosis 07/03/2010   Colonoscopy.    Duodenal ulcer 2017   Fallen arches    Bilateral   GERD (gastroesophageal reflux disease)    Gout    Granuloma  annulare    Hx of adenomatous colonic polyps multiple   Hydrocele 2011   Large septated right hydrocele   Liver cyst    Liver lesion    Nonspecific elevation of levels of transaminase or lactic acid dehydrogenase (LDH)    Obesity    Peripheral neuropathy    Plantar fasciitis    PMH of   Portal hypertension (HCC) 2017   Prostate cancer (HCC)    Right shoulder pain 11/2017   Sleep apnea    no cpap, patient denies   Thiamine deficiency    Wears reading eyeglasses    WPW (Wolff-Parkinson-White syndrome) 02/05/2019   Past Surgical History:  Procedure Laterality Date   BACK SURGERY  04/28/2022   CATARACT EXTRACTION, BILATERAL  12/2011   Dr Nile Riggs   COLONOSCOPY  2017   COLONOSCOPY W/ POLYPECTOMY  07/03/2010   2 adenomas, diverticulosis on right. Dr Leone Payor   CYSTOSCOPY N/A 03/15/2018   Procedure: Derinda Late;  Surgeon: Crista Elliot, MD;  Location: [facility];  Service: Urology;  Laterality: N/A;  NO SEEDS FOUND IN BLADDER   ESOPHAGOGASTRODUODENOSCOPY (EGD) WITH PROPOFOL N/A 01/08/2016   Procedure: ESOPHAGOGASTRODUODENOSCOPY (EGD) WITH PROPOFOL;  Surgeon: Meryl Dare, MD;  Location: [facility];  Service: Endoscopy;  Laterality: N/A;   FLEXIBLE SIGMOIDOSCOPY  2000   RADIOACTIVE SEED IMPLANT N/A  03/15/2018   Procedure: RADIOACTIVE SEED IMPLANT/BRACHYTHERAPY IMPLANT;  Surgeon: Crista Elliot, MD;  Location: [facility];  Service: Urology;  Laterality: N/A;   73 SEEDS IMPLANTED   SIGMOIDOSCOPY     SPACE OAR INSTILLATION N/A 03/15/2018   Procedure: SPACE OAR INSTILLATION;  Surgeon: Crista Elliot, MD;  Location: [facility];  Service: Urology;  Laterality: N/A;   WISDOM TOOTH EXTRACTION       A IV Location/Drains/Wounds Patient Lines/Drains/Airways Status     Active Line/Drains/Airways     Name Placement date Placement time Site Days   Peripheral IV 12/03/22 18 G Anterior;Left Forearm 12/03/22  1843  Forearm   less than 1   Peripheral IV 12/03/22 18 G Anterior;Right Forearm 12/03/22  1909  Forearm  less than 1            Intake/Output Last 24 hours  Intake/Output Summary (Last 24 hours) at 12/03/2022 2221 Last data filed at 12/03/2022 2131 Gross per 24 hour  Intake 2200 ml  Output --  Net 2200 ml    Labs/Imaging Results for orders placed or performed during the hospital encounter of 12/03/22 (from the past 48 hour(s))  CBG monitoring, ED     Status: Abnormal   Collection Time: 12/03/22  6:37 PM  Result Value Ref Range   Glucose-Capillary 176 (H) 70 - 99 mg/dL    Comment: Glucose reference range applies only to samples taken after fasting for at least 8 hours.  Comprehensive metabolic panel     Status: Abnormal   Collection Time: 12/03/22  6:50 PM  Result Value Ref Range   Sodium 136 135 - 145 mmol/L   Potassium 3.9 3.5 - 5.1 mmol/L   Chloride 103 98 - 111 mmol/L   CO2 14 (L) 22 - 32 mmol/L   Glucose, Bld 192 (H) 70 - 99 mg/dL    Comment: Glucose reference range applies only to samples taken after fasting for at least 8 hours.   BUN 27 (H) 8 - 23 mg/dL   Creatinine, Ser 1.61 (H) 0.61 - 1.24 mg/dL   Calcium 8.1 (L) 8.9 - 10.3 mg/dL   Total Protein 5.5 (L) 6.5 - 8.1 g/dL   Albumin 3.0 (L) 3.5 - 5.0 g/dL   AST 36 15 - 41 U/L   ALT 22 0 - 44 U/L    Comment: RESULTS CONFIRMED BY MANUAL DILUTION   Alkaline Phosphatase 208 (H) 38 - 126 U/L   Total Bilirubin 2.7 (H) 0.3 - 1.2 mg/dL   GFR, Estimated 36 (L) >60 mL/min    Comment: (NOTE) Calculated using the CKD-EPI Creatinine Equation (2021)    Anion gap 19 (H) 5 - 15    Comment: Performed at [facility] Lab, [address], [place], [place] [address]  CBC with Differential     Status: Abnormal   Collection Time: 12/03/22  6:50 PM  Result Value Ref Range   WBC 2.9 (L) 4.0 - 10.5 K/uL   RBC 4.36 4.22 - 5.81 MIL/uL   Hemoglobin 14.7 13.0 - 17.0 g/dL   HCT 54.0 98.1 - 19.1 %   MCV 95.9 80.0 - 100.0 fL   MCH 33.7 26.0 - 34.0  pg   MCHC 35.2 30.0 - 36.0 g/dL   RDW 47.8 29.5 - 62.1 %   Platelets 58 (L) 150 - 400 K/uL    Comment: Immature Platelet Fraction may be clinically indicated, consider ordering this additional test [id number] REPEATED TO VERIFY  nRBC 0.0 0.0 - 0.2 %   Neutrophils Relative % 56 %   Neutro Abs 1.9 1.7 - 7.7 K/uL   Band Neutrophils 9 %   Lymphocytes Relative 31 %   Lymphs Abs 0.9 0.7 - 4.0 K/uL   Monocytes Relative 2 %   Monocytes Absolute 0.1 0.1 - 1.0 K/uL   Eosinophils Relative 0 %   Eosinophils Absolute 0.0 0.0 - 0.5 K/uL   Basophils Relative 0 %   Basophils Absolute 0.0 0.0 - 0.1 K/uL   WBC Morphology ATYPICAL MONONUCLEAR CELLS     Comment: Mild Left Shift (1-5% metas, occ myelo)   RBC Morphology MORPHOLOGY UNREMARKABLE    Smear Review MORPHOLOGY UNREMARKABLE    Metamyelocytes Relative 1 %   Myelocytes 1 %   Abs Immature Granulocytes 0.10 (H) 0.00 - 0.07 K/uL    Comment: Performed at [facility], [address], [place], [place] [address]  Protime-INR     Status: Abnormal   Collection Time: 12/03/22  6:50 PM  Result Value Ref Range   Prothrombin Time 18.0 (H) 11.4 - 15.2 seconds   INR 1.5 (H) 0.8 - 1.2    Comment: (NOTE) INR goal varies based on device and disease states. Performed at [facility], [address] [place], [place] [address]   APTT     Status: None   Collection Time: 12/03/22  6:50 PM  Result Value Ref Range   aPTT 32 24 - 36 seconds    Comment: Performed at [facility], [address] [place], [place] [address]  Resp panel by RT-PCR (RSV, Flu A&B, Covid) Anterior Nasal Swab     Status: None   Collection Time: 12/03/22  6:55 PM   Specimen: Anterior Nasal Swab  Result Value Ref Range   SARS Coronavirus 2 by RT PCR NEGATIVE NEGATIVE   Influenza A by PCR NEGATIVE NEGATIVE   Influenza B by PCR NEGATIVE NEGATIVE    Comment: (NOTE) The Xpert Xpress SARS-CoV-2/FLU/RSV plus assay is intended as an aid in the diagnosis of influenza  from Nasopharyngeal swab specimens and should not be used as a sole basis for treatment. Nasal washings and aspirates are unacceptable for Xpert Xpress SARS-CoV-2/FLU/RSV testing.  Fact Sheet for Patients: BloggerCourse.com  Fact Sheet for Healthcare Providers: SeriousBroker.it  This test is not yet approved or cleared by the [place] FDA and has been authorized for detection and/or diagnosis of SARS-CoV-2 by FDA under an Emergency Use Authorization (EUA). This EUA will remain in effect (meaning this test can be used) for the duration of the COVID-19 declaration under Section 564(b)(1) of the Act, 21 U.S.C. section 360bbb-3(b)(1), unless the authorization is terminated or revoked.     Resp Syncytial Virus by PCR NEGATIVE NEGATIVE    Comment: (NOTE) Fact Sheet for Patients: BloggerCourse.com  Fact Sheet for Healthcare Providers: SeriousBroker.it  This test is not yet approved or cleared by the [place] FDA and has been authorized for detection and/or diagnosis of SARS-CoV-2 by FDA under an Emergency Use Authorization (EUA). This EUA will remain in effect (meaning this test can be used) for the duration of the COVID-19 declaration under Section 564(b)(1) of the Act, 21 U.S.C. section 360bbb-3(b)(1), unless the authorization is terminated or revoked.  Performed at [facility], [address], [place], [place] [address]   I-Stat Lactic Acid, ED     Status: Abnormal   Collection Time: 12/03/22  7:02 PM  Result Value Ref Range  Lactic Acid, Venous 9.8 (HH) 0.5 - 1.9 mmol/L   Comment NOTIFIED PHYSICIAN    CT ABDOMEN PELVIS W CONTRAST  Result Date: 12/03/2022 CLINICAL DATA:  Abdomen pain EXAM: CT ABDOMEN AND PELVIS WITH CONTRAST TECHNIQUE: Multidetector CT imaging of the abdomen and pelvis was performed using the standard protocol following bolus administration  of intravenous contrast. RADIATION DOSE REDUCTION: This exam was performed according to the departmental dose-optimization program which includes automated exposure control, adjustment of the mA and/or kV according to patient size and/or use of iterative reconstruction technique. CONTRAST:  75mL OMNIPAQUE IOHEXOL 350 MG/ML SOLN COMPARISON:  CT 05/30/2022 FINDINGS: Lower chest: Lung bases demonstrate small right-sided pleural effusion. Small right cardio phrenic lymph nodes without significant change. Hepatobiliary: Cirrhotic morphology of the liver. 6.1 cm right hepatic cyst for which no specific imaging follow-up is recommended. Mildly distended gallbladder with multiple stones. Possible gallbladder wall thickening. Trace pericholecystic fluid but there is perihepatic ascites Pancreas: Unremarkable. No pancreatic ductal dilatation or surrounding inflammatory changes. Spleen: Enlarged at 16 cm. Adrenals/Urinary Tract: Adrenal glands are normal. Kidneys show no hydronephrosis. The bladder is unremarkable. No excretion of contrast on delayed views of the kidneys. Stomach/Bowel: Stomach nonenlarged. No dilated small bowel. Diverticular disease of the colon. Question mild wall thickening and inflammation at the hepatic flexure on coronal series 6, image 123 through 151. Negative appendix. Vascular/Lymphatic: Moderate aortic atherosclerosis. No aneurysm. No suspicious lymph nodes. Reproductive: Multiple prostate seeds. Other: Negative for pelvic effusion or free air. Small volume right upper quadrant ascites Musculoskeletal: No acute or suspicious osseous abnormality. Multilevel degenerative changes IMPRESSION: 1. 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 2. Mildly distended gallbladder with multiple stones. Possible gallbladder wall thickening and trace pericholecystic fluid but there is perihepatic ascites. Correlate clinically for acute cholecystitis. Follow-up ultrasound if deemed  clinically appropriate. 3. Diverticular disease of the colon. Question mild wall thickening and inflammation at the hepatic flexure of the colon, as may be seen with mild right-sided diverticulitis. 4. Small right pleural effusion. 5. Aortic atherosclerosis. Aortic Atherosclerosis (ICD10-I70.0). Electronically Signed   By: Jasmine Pang M.D.   On: 12/03/2022 21:15   DG Chest Port 1 View  Result Date: 12/03/2022 CLINICAL DATA:  Shortness of breath and fever EXAM: PORTABLE CHEST 1 VIEW COMPARISON:  01/28/2019 FINDINGS: Low lung volumes. Subsegmental atelectasis at the bases. Stable cardiomediastinal silhouette. No pneumothorax IMPRESSION: Low lung volumes with subsegmental atelectasis at the bases. Electronically Signed   By: Jasmine Pang M.D.   On: 12/03/2022 20:23    Pending Labs Unresulted Labs (From admission, onward)     Start     Ordered   12/04/22 0500  CBC  Tomorrow morning,   R        12/03/22 2203   12/04/22 0500  Basic metabolic panel  Tomorrow morning,   R        12/03/22 2203   12/04/22 0500  Magnesium  Tomorrow morning,   R        12/03/22 2203   12/04/22 0500  Phosphorus  Tomorrow morning,   R        12/03/22 2203   12/03/22 1850  Blood Culture (routine x 2)  (Septic presentation on arrival (screening labs, nursing and treatment orders for obvious sepsis))  BLOOD CULTURE X 2,   STAT      12/03/22 1849   12/03/22 1850  Urinalysis, w/ Reflex to Culture (Infection Suspected) -Urine, Clean Catch  (Septic presentation on arrival (screening labs,  nursing and treatment orders for obvious sepsis))  ONCE - URGENT,   URGENT       Question:  Specimen Source  Answer:  Urine, Clean Catch   12/03/22 1849   12/03/22 1850  Pathologist smear review  Once,   R        12/03/22 1850            Vitals/Pain Today's Vitals   12/03/22 2000 12/03/22 2030 12/03/22 2100 12/03/22 2130  BP: (!) 84/49 (!) 88/77 95/60 103/61  Pulse: (!) 128 (!) 119 (!) 116 (!) 116  Resp: (!) 25 (!) 26 (!) 25 (!)  26  Temp:      TempSrc:      SpO2: 95% 94% 94% 95%  Weight:        Isolation Precautions No active isolations  Medications Medications  lactated ringers bolus 1,000 mL (0 mLs Intravenous Stopped 12/03/22 1948)    And  lactated ringers bolus 1,000 mL (0 mLs Intravenous Stopped 12/03/22 2131)    And  lactated ringers bolus 1,000 mL (1,000 mLs Intravenous New Bag/Given 12/03/22 2132)    And  lactated ringers bolus 500 mL (has no administration in time range)  vancomycin (VANCOCIN) 2,500 mg in sodium chloride 0.9 % 500 mL IVPB (2,500 mg Intravenous New Bag/Given 12/03/22 2028)  0.9 %  sodium chloride infusion (has no administration in time range)  norepinephrine (LEVOPHED) 4mg  in (0.016 mg/mL) premix infusion (0 mcg/min Intravenous Hold 12/03/22 2133)  lactated ringers infusion (has no administration in time range)  docusate sodium (COLACE) capsule 100 mg (has no administration in time range)  polyethylene glycol (MIRALAX / GLYCOLAX) packet 17 g (has no administration in time range)  insulin aspart (novoLOG) injection 0-15 Units (has no administration in time range)  ondansetron (ZOFRAN) injection 4 mg (has no administration in time range)  ceFEPIme (MAXIPIME) 2 g in sodium chloride 0.9 % 100 mL IVPB (0 g Intravenous Stopped 12/03/22 2025)  metroNIDAZOLE (FLAGYL) IVPB 500 mg (0 mg Intravenous Stopped 12/03/22 2131)  iohexol (OMNIPAQUE) 350 MG/ML injection 75 mL (75 mLs Intravenous Contrast Given 12/03/22 1942)    Mobility walks     Focused Assessments    R Recommendations: See Admitting Provider Note  Report given to:   Additional Notes: family at bedside

## 2022-12-04 ENCOUNTER — Inpatient Hospital Stay (HOSPITAL_COMMUNITY): Payer: Medicare Other

## 2022-12-04 ENCOUNTER — Encounter (HOSPITAL_COMMUNITY): Payer: Self-pay | Admitting: Pulmonary Disease

## 2022-12-04 ENCOUNTER — Ambulatory Visit: Payer: Medicare Other | Admitting: Internal Medicine

## 2022-12-04 DIAGNOSIS — A419 Sepsis, unspecified organism: Secondary | ICD-10-CM | POA: Diagnosis not present

## 2022-12-04 DIAGNOSIS — R6521 Severe sepsis with septic shock: Secondary | ICD-10-CM | POA: Diagnosis not present

## 2022-12-04 LAB — BLOOD CULTURE ID PANEL (REFLEXED) - BCID2

## 2022-12-04 LAB — GLUCOSE, CAPILLARY
Glucose-Capillary: 140 mg/dL — ABNORMAL HIGH (ref 70–99)
Glucose-Capillary: 149 mg/dL — ABNORMAL HIGH (ref 70–99)
Glucose-Capillary: 138 mg/dL — ABNORMAL HIGH (ref 70–99)
Glucose-Capillary: 146 mg/dL — ABNORMAL HIGH (ref 70–99)
Glucose-Capillary: 156 mg/dL — ABNORMAL HIGH (ref 70–99)
Glucose-Capillary: 108 mg/dL — ABNORMAL HIGH (ref 70–99)

## 2022-12-04 LAB — CG4 I-STAT (LACTIC ACID): Lactic Acid, Venous: 6.8 mmol/L (ref 0.5–1.9)

## 2022-12-04 LAB — BASIC METABOLIC PANEL
Anion gap: 13 (ref 5–15)
BUN: 31 mg/dL — ABNORMAL HIGH (ref 8–23)
CO2: 19 mmol/L — ABNORMAL LOW (ref 22–32)
Calcium: 7.8 mg/dL — ABNORMAL LOW (ref 8.9–10.3)
Chloride: 102 mmol/L (ref 98–111)
Creatinine, Ser: 1.9 mg/dL — ABNORMAL HIGH (ref 0.61–1.24)
GFR, Estimated: 36 mL/min — ABNORMAL LOW (ref >60)
Glucose, Bld: 153 mg/dL — ABNORMAL HIGH (ref 70–99)
Potassium: 3.6 mmol/L (ref 3.5–5.1)
Sodium: 134 mmol/L — ABNORMAL LOW (ref 135–145)

## 2022-12-04 LAB — CBC
HCT: 36.4 % — ABNORMAL LOW (ref 39.0–52.0)
Hemoglobin: 12.6 g/dL — ABNORMAL LOW (ref 13.0–17.0)
MCH: 32.6 pg (ref 26.0–34.0)
MCHC: 34.6 g/dL (ref 30.0–36.0)
MCV: 94.3 fL (ref 80.0–100.0)
Platelets: 41 10*3/uL — ABNORMAL LOW (ref 150–400)
RBC: 3.86 MIL/uL — ABNORMAL LOW (ref 4.22–5.81)
RDW: 14.3 % (ref 11.5–15.5)
WBC: 9.5 10*3/uL (ref 4.0–10.5)
nRBC: 0 % (ref 0.0–0.2)

## 2022-12-04 LAB — LACTIC ACID, PLASMA: Lactic Acid, Venous: 3.9 mmol/L (ref 0.5–1.9)

## 2022-12-04 LAB — PHOSPHORUS: Phosphorus: 2.1 mg/dL — ABNORMAL LOW (ref 2.5–4.6)

## 2022-12-04 LAB — MAGNESIUM: Magnesium: 1.6 mg/dL — ABNORMAL LOW (ref 1.7–2.4)

## 2022-12-04 LAB — MRSA NEXT GEN BY PCR, NASAL: MRSA by PCR Next Gen: NOT DETECTED

## 2022-12-04 MED ORDER — SODIUM PHOSPHATES 45 MMOLE/15ML IV SOLN
15.0000 mmol | Freq: Once | INTRAVENOUS | Status: AC
  Administered 2022-12-04: 15 mmol via INTRAVENOUS
  Filled 2022-12-04 (×2): qty 5

## 2022-12-04 MED ORDER — ALBUTEROL SULFATE (2.5 MG/3ML) 0.083% IN NEBU
2.5000 mg | INHALATION_SOLUTION | Freq: Once | RESPIRATORY_TRACT | Status: AC
  Administered 2022-12-04: 2.5 mg via RESPIRATORY_TRACT
  Filled 2022-12-04: qty 3

## 2022-12-04 MED ORDER — SODIUM CHLORIDE 0.9 % IV SOLN
2.0000 g | INTRAVENOUS | Status: DC
  Administered 2022-12-04 – 2022-12-09 (×6): 2 g via INTRAVENOUS
  Filled 2022-12-04 (×6): qty 20

## 2022-12-04 MED ORDER — MELATONIN 3 MG PO TABS
3.0000 mg | ORAL_TABLET | Freq: Once | ORAL | Status: AC
  Administered 2022-12-04: 3 mg via ORAL
  Filled 2022-12-04: qty 1

## 2022-12-04 MED ORDER — MAGNESIUM SULFATE 4 GM/100ML IV SOLN
4.0000 g | Freq: Once | INTRAVENOUS | Status: AC
  Administered 2022-12-04: 4 g via INTRAVENOUS
  Filled 2022-12-04: qty 100

## 2022-12-04 MED ORDER — PANTOPRAZOLE SODIUM 40 MG IV SOLR
40.0000 mg | Freq: Two times a day (BID) | INTRAVENOUS | Status: DC
  Administered 2022-12-04 – 2022-12-09 (×12): 40 mg via INTRAVENOUS
  Filled 2022-12-04 (×12): qty 10

## 2022-12-04 MED ORDER — LORAZEPAM 2 MG/ML IJ SOLN
0.5000 mg | Freq: Once | INTRAMUSCULAR | Status: AC
  Administered 2022-12-04: 0.5 mg via INTRAVENOUS
  Filled 2022-12-04: qty 1

## 2022-12-04 MED ORDER — ALBUMIN HUMAN 5 % IV SOLN
25.0000 g | Freq: Once | INTRAVENOUS | Status: AC
  Administered 2022-12-04: 25 g via INTRAVENOUS
  Filled 2022-12-04: qty 500

## 2022-12-04 MED ORDER — CHLORHEXIDINE GLUCONATE CLOTH 2 % EX PADS
6.0000 | MEDICATED_PAD | Freq: Every day | CUTANEOUS | Status: DC
  Administered 2022-12-04 – 2022-12-08 (×4): 6 via TOPICAL

## 2022-12-04 MED ORDER — LORAZEPAM BOLUS VIA INFUSION: 0.5000 mg | Freq: Once | INTRAVENOUS | Status: DC

## 2022-12-04 MED ORDER — POTASSIUM CHLORIDE 10 MEQ/100ML IV SOLN
10.0000 meq | INTRAVENOUS | Status: AC
Start: 2022-12-04 — End: 2022-12-04
  Administered 2022-12-04 (×4): 10 meq via INTRAVENOUS
  Filled 2022-12-04 (×4): qty 100

## 2022-12-04 NOTE — Plan of Care (Signed)

## 2022-12-04 NOTE — Progress Notes (Signed)
[facility] ADULT ICU REPLACEMENT PROTOCOL   The patient does apply for the [facility] Adult ICU Electrolyte Replacment Protocol based on the criteria listed below:   1.Exclusion criteria: TCTS, ECMO, Dialysis, and Myasthenia Gravis patients 2. Is GFR >/= 30 ml/min? Yes.    Patient's GFR today is 36 3. Is SCr </= 2? Yes.   Patient's SCr is 1.90 mg/dL 4. Did SCr increase >/= 0.5 in 24 hours? No. 5.Pt's weight >40kg  Yes.   6. Abnormal electrolyte(s): K+ = 3.6, Mg = 1.6  7. Electrolytes replaced per protocol 8.  Call MD STAT for K+ </= 2.5, Phos </= 1, or Mag </= 1 Physician:  Warrick Parisian, eMD  Faye Ramsay 12/04/2022 3:33 AM

## 2022-12-04 NOTE — Consult Note (Signed)
Consult Note  Patrick Kidd Nov 25, 1945  [id number].    Requesting MD: Dr. Rhae Hammock Chief Complaint/Reason for Consult: possible cholecystitis  HPI:  77 y.o. male with medical history significant for DM, PUD, ETOH cirrhosis, PVD, and cholecystitis who presented to [facility] ED 10/30 via EMS with fever, SHOB and diaphoresis starting that day. He had presented to the ED the day prior 10/29 with abdominal pain which began about 2 weeks ago and was discharged on augmentin for possible gallbladder source. He elected against imaging 10/29. He does states that his symptoms were acutely worse 2 days ago. Since initial presentation abdominal pain has improved but not resolved. He has history of cholecystitis and related e coli bacteremia in April 2024 and felt the symptoms were similar. That episode was treated with abx and outpatient GI follow up (Dr. Leone Payor). Work up in ED 10/30 significant for hypotension, AKI, and CT concerning for acute cholecystitis. He was admitted to the ICU and general surgery asked to see for treatment of cholecystitis.  Today abdominal pain is improved but not resolved. He continues to complain of right sided abdominal pain and shortness of breath. Some nausea, no emesis. No further fevers or chills  He follow with Dr. Gwendolyn Fill GI for cirrhosis with EGD 2023 without varices   ROS: Reviewed and as above  Family History  Problem Relation Age of Onset   Lung cancer Mother        smoker   Leukemia Father        Acute myelocytic   Diabetes Neg Hx    Stroke Neg Hx    Heart disease Neg Hx    Colon cancer Neg Hx    Colon polyps Neg Hx    Esophageal cancer Neg Hx    Rectal cancer Neg Hx    Stomach cancer Neg Hx     Past Medical History:  Diagnosis Date   Acrophobia    Alcoholic cirrhosis (HCC) 10/04/2015   Anemia    Arthritis    foot by big toe   BPH associated with nocturia    Cataract    removed both eyes   Cholecystitis 05/2022   tx Abx   Chronic  cough    PMH of   CLL (chronic lymphocytic leukemia) (HCC)    COVID-19 12/2018   Diabetes mellitus without complication (HCC)    Diverticulosis 07/03/2010   Colonoscopy.    Duodenal ulcer 2017   Fallen arches    Bilateral   GERD (gastroesophageal reflux disease)    Gout    Granuloma annulare    Hx of adenomatous colonic polyps multiple   Hydrocele 2011   Large septated right hydrocele   Liver cyst    Liver lesion    Nonspecific elevation of levels of transaminase or lactic acid dehydrogenase (LDH)    Obesity    Peripheral neuropathy    Plantar fasciitis    PMH of   Portal hypertension (HCC) 2017   Prostate cancer (HCC)    Right shoulder pain 11/2017   Sleep apnea    no cpap, patient denies   Thiamine deficiency    Wears reading eyeglasses    WPW (Wolff-Parkinson-White syndrome) 02/05/2019    Past Surgical History:  Procedure Laterality Date   BACK SURGERY  04/28/2022   CATARACT EXTRACTION, BILATERAL  12/2011   Dr Nile Riggs   COLONOSCOPY  2017   COLONOSCOPY W/ POLYPECTOMY  07/03/2010   2 adenomas, diverticulosis on right. Dr Leone Payor   CYSTOSCOPY  N/A 03/15/2018   Procedure: CYSTOSCOPY FLEXIBLE;  Surgeon: Crista Elliot, MD;  Location: [facility];  Service: Urology;  Laterality: N/A;  NO SEEDS FOUND IN BLADDER   ESOPHAGOGASTRODUODENOSCOPY (EGD) WITH PROPOFOL N/A 01/08/2016   Procedure: ESOPHAGOGASTRODUODENOSCOPY (EGD) WITH PROPOFOL;  Surgeon: Meryl Dare, MD;  Location: [facility];  Service: Endoscopy;  Laterality: N/A;   FLEXIBLE SIGMOIDOSCOPY  2000   RADIOACTIVE SEED IMPLANT N/A 03/15/2018   Procedure: RADIOACTIVE SEED IMPLANT/BRACHYTHERAPY IMPLANT;  Surgeon: Crista Elliot, MD;  Location: [facility];  Service: Urology;  Laterality: N/A;   73 SEEDS IMPLANTED   SIGMOIDOSCOPY     SPACE OAR INSTILLATION N/A 03/15/2018   Procedure: SPACE OAR INSTILLATION;  Surgeon: Crista Elliot, MD;  Location: [facility];   Service: Urology;  Laterality: N/A;   WISDOM TOOTH EXTRACTION      Social History:  reports that he quit smoking about 51 years ago. His smoking use included cigarettes. He started smoking about 57 years ago. He has a 12 pack-year smoking history. He has never been exposed to tobacco smoke. He has never used smokeless tobacco. He reports that he does not currently use alcohol. He reports that he does not use drugs.  Allergies:  Allergies  Allergen Reactions   Rifaximin Nausea And Vomiting    Required EMS visit, although patient did not go to hospital   Beta Adrenergic Blockers     Likely WPW.  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  Use with caution.    Medications Prior to Admission  Medication Sig Dispense Refill   acetaminophen (TYLENOL) 500 MG tablet Take 1 tablet (500 mg total) by mouth every 6 (six) hours as needed. (Patient taking differently: Take 1,000 mg by mouth every 6 (six) hours as needed for moderate pain (pain score 4-6) or mild pain (pain score 1-3).) 20 tablet 0   amoxicillin-clavulanate (AUGMENTIN) 875-125 MG tablet Take 1 tablet by mouth every 12 (twelve) hours. 14 tablet 0   Continuous Blood Gluc Receiver (FREESTYLE LIBRE 3 READER) DEVI 1 Act by Does not apply route daily. 1 each 3   Continuous Blood Gluc Sensor (FREESTYLE LIBRE 3 SENSOR) MISC 1 Act by Does not apply route daily. Place 1 sensor on the skin every 14 days. Use to check glucose continuously 2 each 5   gabapentin (NEURONTIN) 300 MG capsule Take 1 capsule (300 mg total) by mouth 3 (three) times daily. 270 capsule 3   HYDROcodone-acetaminophen (NORCO/VICODIN) 5-325 MG tablet Take 1 tablet by mouth every 6 (six) hours as needed for severe pain (pain score 7-10). 5 tablet 0   insulin glargine, 2 Unit Dial, (TOUJEO MAX SOLOSTAR) 300 UNIT/ML Solostar Pen Inject 50 Units into the skin daily. Via [facility] pt assistance (Patient taking differently: Inject 50 Units into the skin daily after  breakfast. Via [employer] pt assistance) 15 mL 1   Insulin Pen Needle (PEN NEEDLES) 32G X 5 MM MISC Inject 1 Act into the skin daily. Use to administer insulin. Dx E11.9 Dispense based on insurance preference. 100 each 1   Lancets (ACCU-CHEK MULTICLIX) lancets Use to check sugar daily. DX: E11.8 100 each 12   metFORMIN (GLUCOPHAGE) 500 MG tablet TAKE 1 TABLET BY MOUTH 2 TIMES DAILY WITH A MEAL. 180 tablet 0   Multiple Vitamins-Minerals (CENTRUM SILVER 50+MEN) TABS Take 1 tablet by mouth daily.     pantoprazole (PROTONIX) 40 MG tablet Take  1 tablet (40 mg total) by mouth 2 (two) times daily. TAKE 1 TABLET 30 MINUTES BEFORE MEALS (BREAKFAST AND SUPPER) TWICE A DAY 180 tablet 0    Blood pressure 101/67, pulse 99, temperature 97.6 F (36.4 C), temperature source Oral, resp. rate (!) 27, weight 113.1 kg, SpO2 95%. Physical Exam: General: pleasant, WD, overweight male who is sitting up in NAD HEENT: sclera anicteric Heart: regular, rate, and rhythm.   Lungs: somewhat shallow respirations, O2 sats stable on [place] Abd: obese, soft, TTP RUQ, moderate distention, reducible umbilical hernia  MS: all 4 extremities are symmetrical with no cyanosis, clubbing, or edema. Psych: A&Ox3 with an appropriate affect.    Results for orders placed or performed during the hospital encounter of 12/03/22 (from the past 48 hour(s))  CBG monitoring, ED     Status: Abnormal   Collection Time: 12/03/22  6:37 PM  Result Value Ref Range   Glucose-Capillary 176 (H) 70 - 99 mg/dL    Comment: Glucose reference range applies only to samples taken after fasting for at least 8 hours.  Comprehensive metabolic panel     Status: Abnormal   Collection Time: 12/03/22  6:50 PM  Result Value Ref Range   Sodium 136 135 - 145 mmol/L   Potassium 3.9 3.5 - 5.1 mmol/L   Chloride 103 98 - 111 mmol/L   CO2 14 (L) 22 - 32 mmol/L   Glucose, Bld 192 (H) 70 - 99 mg/dL    Comment: Glucose reference range applies only to samples taken after  fasting for at least 8 hours.   BUN 27 (H) 8 - 23 mg/dL   Creatinine, Ser 1.61 (H) 0.61 - 1.24 mg/dL   Calcium 8.1 (L) 8.9 - 10.3 mg/dL   Total Protein 5.5 (L) 6.5 - 8.1 g/dL   Albumin 3.0 (L) 3.5 - 5.0 g/dL   AST 36 15 - 41 U/L   ALT 22 0 - 44 U/L    Comment: RESULTS CONFIRMED BY MANUAL DILUTION   Alkaline Phosphatase 208 (H) 38 - 126 U/L   Total Bilirubin 2.7 (H) 0.3 - 1.2 mg/dL   GFR, Estimated 36 (L) >60 mL/min    Comment: (NOTE) Calculated using the CKD-EPI Creatinine Equation (2021)    Anion gap 19 (H) 5 - 15    Comment: Performed at [facility], [address] [place], [place] [address]  CBC with Differential     Status: Abnormal   Collection Time: 12/03/22  6:50 PM  Result Value Ref Range   WBC 2.9 (L) 4.0 - 10.5 K/uL   RBC 4.36 4.22 - 5.81 MIL/uL   Hemoglobin 14.7 13.0 - 17.0 g/dL   HCT 54.0 98.1 - 19.1 %   MCV 95.9 80.0 - 100.0 fL   MCH 33.7 26.0 - 34.0 pg   MCHC 35.2 30.0 - 36.0 g/dL   RDW 47.8 29.5 - 62.1 %   Platelets 58 (L) 150 - 400 K/uL    Comment: Immature Platelet Fraction may be clinically indicated, consider ordering this additional test [id number] REPEATED TO VERIFY    nRBC 0.0 0.0 - 0.2 %   Neutrophils Relative % 56 %   Neutro Abs 1.9 1.7 - 7.7 K/uL   Band Neutrophils 9 %   Lymphocytes Relative 31 %   Lymphs Abs 0.9 0.7 - 4.0 K/uL   Monocytes Relative 2 %   Monocytes Absolute 0.1 0.1 - 1.0 K/uL   Eosinophils Relative 0 %   Eosinophils Absolute 0.0 0.0 -  0.5 K/uL   Basophils Relative 0 %   Basophils Absolute 0.0 0.0 - 0.1 K/uL   WBC Morphology ATYPICAL MONONUCLEAR CELLS     Comment: Mild Left Shift (1-5% metas, occ myelo)   RBC Morphology MORPHOLOGY UNREMARKABLE    Smear Review MORPHOLOGY UNREMARKABLE    Metamyelocytes Relative 1 %   Myelocytes 1 %   Abs Immature Granulocytes 0.10 (H) 0.00 - 0.07 K/uL    Comment: Performed at [facility], [address], [place], [place] [address]  Protime-INR     Status: Abnormal   Collection  Time: 12/03/22  6:50 PM  Result Value Ref Range   Prothrombin Time 18.0 (H) 11.4 - 15.2 seconds   INR 1.5 (H) 0.8 - 1.2    Comment: (NOTE) INR goal varies based on device and disease states. Performed at [facility], [address], [place], [place] [address]   APTT     Status: None   Collection Time: 12/03/22  6:50 PM  Result Value Ref Range   aPTT 32 24 - 36 seconds    Comment: Performed at [facility], [address], [place], [place] [address]  Resp panel by RT-PCR (RSV, Flu A&B, Covid) Anterior Nasal Swab     Status: None   Collection Time: 12/03/22  6:55 PM   Specimen: Anterior Nasal Swab  Result Value Ref Range   SARS Coronavirus 2 by RT PCR NEGATIVE NEGATIVE   Influenza A by PCR NEGATIVE NEGATIVE   Influenza B by PCR NEGATIVE NEGATIVE    Comment: (NOTE) The Xpert Xpress SARS-CoV-2/FLU/RSV plus assay is intended as an aid in the diagnosis of influenza from Nasopharyngeal swab specimens and should not be used as a sole basis for treatment. Nasal washings and aspirates are unacceptable for Xpert Xpress SARS-CoV-2/FLU/RSV testing.  Fact Sheet for Patients: BloggerCourse.com  Fact Sheet for Healthcare Providers: SeriousBroker.it  This test is not yet approved or cleared by the [place] FDA and has been authorized for detection and/or diagnosis of SARS-CoV-2 by FDA under an Emergency Use Authorization (EUA). This EUA will remain in effect (meaning this test can be used) for the duration of the COVID-19 declaration under Section 564(b)(1) of the Act, 21 U.S.C. section 360bbb-3(b)(1), unless the authorization is terminated or revoked.     Resp Syncytial Virus by PCR NEGATIVE NEGATIVE    Comment: (NOTE) Fact Sheet for Patients: BloggerCourse.com  Fact Sheet for Healthcare Providers: SeriousBroker.it  This test is not yet approved or cleared by the  [place] FDA and has been authorized for detection and/or diagnosis of SARS-CoV-2 by FDA under an Emergency Use Authorization (EUA). This EUA will remain in effect (meaning this test can be used) for the duration of the COVID-19 declaration under Section 564(b)(1) of the Act, 21 U.S.C. section 360bbb-3(b)(1), unless the authorization is terminated or revoked.  Performed at [facility], [address], [place], [place] [address]   Blood Culture (routine x 2)     Status: None (Preliminary result)   Collection Time: 12/03/22  6:59 PM   Specimen: BLOOD  Result Value Ref Range   Specimen Description BLOOD RIGHT ANTECUBITAL    Special Requests      BOTTLES DRAWN AEROBIC AND ANAEROBIC Blood Culture adequate volume   Culture  Setup Time      GRAM NEGATIVE RODS IN BOTH AEROBIC AND ANAEROBIC BOTTLES CRITICAL VALUE NOTED.  VALUE IS CONSISTENT WITH PREVIOUSLY REPORTED AND CALLED VALUE. Performed at [facility], [address], [place], [place] [address]    Culture GRAM NEGATIVE RODS    Report Status PENDING   I-Stat Lactic Acid, ED     Status: Abnormal   Collection Time: 12/03/22  7:02 PM  Result Value Ref Range   Lactic Acid, Venous 9.8 (HH) 0.5 - 1.9 mmol/L   Comment NOTIFIED PHYSICIAN   Blood Culture (routine x 2)     Status: None (Preliminary result)   Collection Time: 12/03/22  7:08 PM   Specimen: BLOOD RIGHT FOREARM  Result Value Ref Range   Specimen Description BLOOD RIGHT FOREARM    Special Requests      BOTTLES DRAWN AEROBIC AND ANAEROBIC Blood Culture results may not be optimal due to an excessive volume of blood received in culture bottles   Culture  Setup Time      GRAM NEGATIVE RODS IN BOTH AEROBIC AND ANAEROBIC BOTTLES CRITICAL RESULT CALLED TO, READ BACK BY AND VERIFIED WITH: PHARMD J LEDFORD 12/04/22 @ 0714 BY AB Performed at [facility], [address] [place], [place] [address]    Culture GRAM NEGATIVE RODS    Report Status PENDING    Blood Culture ID Panel (Reflexed)     Status: Abnormal   Collection Time: 12/03/22  7:08 PM  Result Value Ref Range   Enterococcus faecalis NOT DETECTED NOT DETECTED   Enterococcus Faecium NOT DETECTED NOT DETECTED   Listeria monocytogenes NOT DETECTED NOT DETECTED   Staphylococcus species NOT DETECTED NOT DETECTED   Staphylococcus aureus (BCID) NOT DETECTED NOT DETECTED   Staphylococcus epidermidis NOT DETECTED NOT DETECTED   Staphylococcus lugdunensis NOT DETECTED NOT DETECTED   Streptococcus species NOT DETECTED NOT DETECTED   Streptococcus agalactiae NOT DETECTED NOT DETECTED   Streptococcus pneumoniae NOT DETECTED NOT DETECTED   Streptococcus pyogenes NOT DETECTED NOT DETECTED   A.calcoaceticus-baumannii NOT DETECTED NOT DETECTED   Bacteroides fragilis NOT DETECTED NOT DETECTED   Enterobacterales DETECTED (A) NOT DETECTED    Comment: CRITICAL RESULT CALLED TO, READ BACK BY AND VERIFIED WITH: PHARMD J LEDFORD 12/04/22 @ 0714 BY AB    Enterobacter cloacae complex NOT DETECTED NOT DETECTED   Escherichia coli DETECTED (A) NOT DETECTED    Comment: CRITICAL RESULT CALLED TO, READ BACK BY AND VERIFIED WITH: PHARMD J LEDFORD 12/04/22 @ 0714 BY AB    Klebsiella aerogenes NOT DETECTED NOT DETECTED   Klebsiella oxytoca DETECTED (A) NOT DETECTED    Comment: CRITICAL RESULT CALLED TO, READ BACK BY AND VERIFIED WITH: PHARMD J LEDFORD 12/04/22 @ 0714 BY AB    Klebsiella pneumoniae NOT DETECTED NOT DETECTED   Proteus species NOT DETECTED NOT DETECTED   Salmonella species NOT DETECTED NOT DETECTED   Serratia marcescens NOT DETECTED NOT DETECTED   Haemophilus influenzae NOT DETECTED NOT DETECTED   Neisseria meningitidis NOT DETECTED NOT DETECTED   Pseudomonas aeruginosa NOT DETECTED NOT DETECTED   Stenotrophomonas maltophilia NOT DETECTED NOT DETECTED   Candida albicans NOT DETECTED NOT DETECTED   Candida auris NOT DETECTED NOT DETECTED   Candida glabrata NOT DETECTED NOT DETECTED    Candida krusei NOT DETECTED NOT DETECTED   Candida parapsilosis NOT DETECTED NOT DETECTED   Candida tropicalis NOT DETECTED NOT DETECTED   Cryptococcus neoformans/gattii NOT DETECTED NOT DETECTED   CTX-M ESBL NOT DETECTED NOT DETECTED   Carbapenem resistance IMP NOT DETECTED NOT DETECTED   Carbapenem resistance KPC NOT DETECTED NOT DETECTED   Carbapenem resistance NDM NOT DETECTED NOT DETECTED   Carbapenem  resist OXA 48 LIKE NOT DETECTED NOT DETECTED   Carbapenem resistance VIM NOT DETECTED NOT DETECTED    Comment: Performed at [facility], [address], [place], [place] [address]  CG4 I-STAT (Lactic acid)     Status: Abnormal   Collection Time: 12/03/22  9:16 PM  Result Value Ref Range   Lactic Acid, Venous 6.8 (HH) 0.5 - 1.9 mmol/L   Comment NOTIFIED PHYSICIAN   I-Stat Lactic Acid, ED     Status: Abnormal   Collection Time: 12/03/22 10:59 PM  Result Value Ref Range   Lactic Acid, Venous 6.0 (HH) 0.5 - 1.9 mmol/L   Comment NOTIFIED PHYSICIAN   Glucose, capillary     Status: Abnormal   Collection Time: 12/03/22 11:12 PM  Result Value Ref Range   Glucose-Capillary 159 (H) 70 - 99 mg/dL    Comment: Glucose reference range applies only to samples taken after fasting for at least 8 hours.  MRSA Next Gen by PCR, Nasal     Status: None   Collection Time: 12/03/22 11:14 PM   Specimen: Nasal Mucosa; Nasal Swab  Result Value Ref Range   MRSA by PCR Next Gen NOT DETECTED NOT DETECTED    Comment: (NOTE) The GeneXpert MRSA Assay (FDA approved for NASAL specimens only), is one component of a comprehensive MRSA colonization surveillance program. It is not intended to diagnose MRSA infection nor to guide or monitor treatment for MRSA infections. Test performance is not FDA approved in patients less than 5 years old. Performed at [facility], [address], [place], [place] [address]   CBC     Status: Abnormal   Collection Time: 12/04/22 12:31 AM  Result Value Ref Range    WBC 9.5 4.0 - 10.5 K/uL   RBC 3.86 (L) 4.22 - 5.81 MIL/uL   Hemoglobin 12.6 (L) 13.0 - 17.0 g/dL   HCT 41.3 (L) 24.4 - 01.0 %   MCV 94.3 80.0 - 100.0 fL   MCH 32.6 26.0 - 34.0 pg   MCHC 34.6 30.0 - 36.0 g/dL   RDW 27.2 53.6 - 64.4 %   Platelets 41 (L) 150 - 400 K/uL    Comment: Immature Platelet Fraction may be clinically indicated, consider ordering this additional test [id number] REPEATED TO VERIFY    nRBC 0.0 0.0 - 0.2 %    Comment: Performed at [facility], [address], [place], [place] [address]  Basic metabolic panel     Status: Abnormal   Collection Time: 12/04/22 12:31 AM  Result Value Ref Range   Sodium 134 (L) 135 - 145 mmol/L   Potassium 3.6 3.5 - 5.1 mmol/L   Chloride 102 98 - 111 mmol/L   CO2 19 (L) 22 - 32 mmol/L   Glucose, Bld 153 (H) 70 - 99 mg/dL    Comment: Glucose reference range applies only to samples taken after fasting for at least 8 hours.   BUN 31 (H) 8 - 23 mg/dL   Creatinine, Ser 5.64 (H) 0.61 - 1.24 mg/dL   Calcium 7.8 (L) 8.9 - 10.3 mg/dL   GFR, Estimated 36 (L) >60 mL/min    Comment: (NOTE) Calculated using the CKD-EPI Creatinine Equation (2021)    Anion gap 13 5 - 15    Comment: Performed at [facility], [address], [place], [place] [address]  Magnesium     Status: Abnormal   Collection Time: 12/04/22 12:31 AM  Result Value Ref Range  Magnesium 1.6 (L) 1.7 - 2.4 mg/dL    Comment: Performed at [facility], [address] [place], [place] [address]  Phosphorus     Status: Abnormal   Collection Time: 12/04/22 12:31 AM  Result Value Ref Range   Phosphorus 2.1 (L) 2.5 - 4.6 mg/dL    Comment: Performed at [facility], [address], [place], [place] [address]  Lactic acid, plasma     Status: Abnormal   Collection Time: 12/04/22 12:31 AM  Result Value Ref Range   Lactic Acid, Venous 3.9 (HH) 0.5 - 1.9 mmol/L    Comment: CRITICAL RESULT CALLED TO, READ BACK BY AND VERIFIED WITH J.PARRISH RN 0105 12/04/2022 BY  G.GANADEN Performed at [facility], [address] [place], [place] [address]   Glucose, capillary     Status: Abnormal   Collection Time: 12/04/22  3:41 AM  Result Value Ref Range   Glucose-Capillary 140 (H) 70 - 99 mg/dL    Comment: Glucose reference range applies only to samples taken after fasting for at least 8 hours.  Glucose, capillary     Status: Abnormal   Collection Time: 12/04/22  7:30 AM  Result Value Ref Range   Glucose-Capillary 149 (H) 70 - 99 mg/dL    Comment: Glucose reference range applies only to samples taken after fasting for at least 8 hours.  Glucose, capillary     Status: Abnormal   Collection Time: 12/04/22 11:14 AM  Result Value Ref Range   Glucose-Capillary 138 (H) 70 - 99 mg/dL    Comment: Glucose reference range applies only to samples taken after fasting for at least 8 hours.   CT ABDOMEN PELVIS W CONTRAST  Result Date: 12/03/2022 CLINICAL DATA:  Abdomen pain EXAM: CT ABDOMEN AND PELVIS WITH CONTRAST TECHNIQUE: Multidetector CT imaging of the abdomen and pelvis was performed using the standard protocol following bolus administration of intravenous contrast. RADIATION DOSE REDUCTION: This exam was performed according to the departmental dose-optimization program which includes automated exposure control, adjustment of the mA and/or kV according to patient size and/or use of iterative reconstruction technique. CONTRAST:  75mL OMNIPAQUE IOHEXOL 350 MG/ML SOLN COMPARISON:  CT 05/30/2022 FINDINGS: Lower chest: Lung bases demonstrate small right-sided pleural effusion. Small right cardio phrenic lymph nodes without significant change. Hepatobiliary: Cirrhotic morphology of the liver. 6.1 cm right hepatic cyst for which no specific imaging follow-up is recommended. Mildly distended gallbladder with multiple stones. Possible gallbladder wall thickening. Trace pericholecystic fluid but there is perihepatic ascites Pancreas: Unremarkable. No pancreatic ductal  dilatation or surrounding inflammatory changes. Spleen: Enlarged at 16 cm. Adrenals/Urinary Tract: Adrenal glands are normal. Kidneys show no hydronephrosis. The bladder is unremarkable. No excretion of contrast on delayed views of the kidneys. Stomach/Bowel: Stomach nonenlarged. No dilated small bowel. Diverticular disease of the colon. Question mild wall thickening and inflammation at the hepatic flexure on coronal series 6, image 123 through 151. Negative appendix. Vascular/Lymphatic: Moderate aortic atherosclerosis. No aneurysm. No suspicious lymph nodes. Reproductive: Multiple prostate seeds. Other: Negative for pelvic effusion or free air. Small volume right upper quadrant ascites Musculoskeletal: No acute or suspicious osseous abnormality. Multilevel degenerative changes IMPRESSION: 1. 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 2. Mildly distended gallbladder with multiple stones. Possible gallbladder wall thickening and trace pericholecystic fluid but there is perihepatic ascites. Correlate clinically for acute cholecystitis. Follow-up ultrasound if deemed clinically appropriate. 3. Diverticular disease of the colon. Question mild wall thickening and inflammation at  the hepatic flexure of the colon, as may be seen with mild right-sided diverticulitis. 4. Small right pleural effusion. 5. Aortic atherosclerosis. Aortic Atherosclerosis (ICD10-I70.0). Electronically Signed   By: Jasmine Pang M.D.   On: 12/03/2022 21:15   DG Chest Port 1 View  Result Date: 12/03/2022 CLINICAL DATA:  Shortness of breath and fever EXAM: PORTABLE CHEST 1 VIEW COMPARISON:  01/28/2019 FINDINGS: Low lung volumes. Subsegmental atelectasis at the bases. Stable cardiomediastinal silhouette. No pneumothorax IMPRESSION: Low lung volumes with subsegmental atelectasis at the bases. Electronically Signed   By: Jasmine Pang M.D.   On: 12/03/2022 20:23      Assessment/Plan Possible  cholecystitis ETOH cirrhosis  Patient seen and examined and relevant labs and imaging reviewed. He has prior history of cholecystitis with positive HIDA 05/2022 which resolved on antibiotics. Current clinical presentation concerning for repeat cholecystitis and he does have cholelithiasis and GB wall thickening with pericholecystic fluid though this is in the setting of underlying cirrhosis. He has splenomegaly and thrombocytopenia suggesting portal hypertension as previously evaluated by hepatobiliary surgery - Dr. Freida Busman. Currently MELD score 21 and Child Class B. Agree with [place] for further evaluation. If [place] also concerning for cholecystitis he is considered high risk for surgical intervention and recommend ongoing antibiotics and further consideration of percutaneous cholecystostomy tube though this understandably is not low risk either. Could also consider repeat HIDA if findings remain equivocal.    FEN: NPO for further workup ID: [email or web address] VTE: held in setting of thrombocytopenia  I reviewed last 24 h vitals and pain scores, last 48 h intake and output, last 24 h labs and trends, and last 24 h imaging results.   Carlena Bjornstad, PA-C [facility] 12/04/2022, 12:28 PM Please see Amion for pager number during day hours 7:00am-4:30pm

## 2022-12-04 NOTE — Progress Notes (Signed)
eLink Physician-Brief Progress Note Patient Name: Patrick Kidd DOB: May 27, 1945 MRN: [id number]   Date of Service  12/04/2022  HPI/Events of Note  Patient with a history of alcoholism and cirrhosis of the liver admitted with septic shock suspected to be secondary to acute cholecystitis.  eICU Interventions  New Patient Evaluation.        [name] 12/04/2022, 12:16 AM

## 2022-12-04 NOTE — Progress Notes (Addendum)
PHARMACY - PHYSICIAN COMMUNICATION CRITICAL VALUE ALERT - BLOOD CULTURE IDENTIFICATION (BCID)  Patrick Kidd is an 77 y.o. male who presented to [facility] on 12/03/2022 with a chief complaint of rigors  Assessment:  3/4 Bcx with GNR growing Ecoli and Klebsiella oxytoca  Name of physician (or Provider) Contacted: Hunsucker, Ardyth Harps  Current antibiotics: vancomycin, zosyn   Changes to prescribed antibiotics recommended:  De-escalate to ceftriaxone 2g IV q 24h   Results for orders placed or performed during the hospital encounter of 12/03/22  Blood Culture ID Panel (Reflexed) (Collected: 12/03/2022  7:08 PM)  Result Value Ref Range   Enterococcus faecalis NOT DETECTED NOT DETECTED   Enterococcus Faecium NOT DETECTED NOT DETECTED   Listeria monocytogenes NOT DETECTED NOT DETECTED   Staphylococcus species NOT DETECTED NOT DETECTED   Staphylococcus aureus (BCID) NOT DETECTED NOT DETECTED   Staphylococcus epidermidis NOT DETECTED NOT DETECTED   Staphylococcus lugdunensis NOT DETECTED NOT DETECTED   Streptococcus species NOT DETECTED NOT DETECTED   Streptococcus agalactiae NOT DETECTED NOT DETECTED   Streptococcus pneumoniae NOT DETECTED NOT DETECTED   Streptococcus pyogenes NOT DETECTED NOT DETECTED   A.calcoaceticus-baumannii NOT DETECTED NOT DETECTED   Bacteroides fragilis NOT DETECTED NOT DETECTED   Enterobacterales DETECTED (A) NOT DETECTED   Enterobacter cloacae complex NOT DETECTED NOT DETECTED   Escherichia coli DETECTED (A) NOT DETECTED   Klebsiella aerogenes NOT DETECTED NOT DETECTED   Klebsiella oxytoca DETECTED (A) NOT DETECTED   Klebsiella pneumoniae NOT DETECTED NOT DETECTED   Proteus species NOT DETECTED NOT DETECTED   Salmonella species NOT DETECTED NOT DETECTED   Serratia marcescens NOT DETECTED NOT DETECTED   Haemophilus influenzae NOT DETECTED NOT DETECTED   Neisseria meningitidis NOT DETECTED NOT DETECTED   Pseudomonas aeruginosa NOT DETECTED NOT DETECTED    Stenotrophomonas maltophilia NOT DETECTED NOT DETECTED   Candida albicans NOT DETECTED NOT DETECTED   Candida auris NOT DETECTED NOT DETECTED   Candida glabrata NOT DETECTED NOT DETECTED   Candida krusei NOT DETECTED NOT DETECTED   Candida parapsilosis NOT DETECTED NOT DETECTED   Candida tropicalis NOT DETECTED NOT DETECTED   Cryptococcus neoformans/gattii NOT DETECTED NOT DETECTED   CTX-M ESBL NOT DETECTED NOT DETECTED   Carbapenem resistance IMP NOT DETECTED NOT DETECTED   Carbapenem resistance KPC NOT DETECTED NOT DETECTED   Carbapenem resistance NDM NOT DETECTED NOT DETECTED   Carbapenem resist OXA 48 LIKE NOT DETECTED NOT DETECTED   Carbapenem resistance VIM NOT DETECTED NOT DETECTED    Calton Dach, PharmD, BCCCP Clinical Pharmacist 12/04/2022 7:17 AM

## 2022-12-04 NOTE — Plan of Care (Signed)
  Request seen for percutaneous cholecystostomy.  Images reviewed by Dr. Fredia Sorrow.  Patient does not have cholecystitis by CT.   He needs [place] first, possibly HIDA.   He also has cirrhosis w/ small amount of ascites, so higher risk.   Would only do if white count and convincing cholecystitis clinically and by additional imaging.  Sarabelle Genson S Kateleen Encarnacion PA-C 12/04/2022 10:08 AM

## 2022-12-04 NOTE — Progress Notes (Signed)
NAME:  Patrick Kidd, MRN:  [id number], DOB:  03-31-1945, LOS: 1 ADMISSION DATE:  12/03/2022, CONSULTATION DATE:  12/03/2022 REFERRING MD:  Rhae Hammock, CHIEF COMPLAINT:  Hypotension   History of Present Illness:  Patrick Kidd is a 77 y.o. male who has a PMH as below including but not limited to EtOH cirrhosis well controlled and no longer drinking, PUD requiring cautery in the past, DM, cholecystitis. He had admission back in April 2024 for acute calculous cholecystitis and E.coli bacteremia. He was deemed to be at increased risk for surgery; therefore, he was treated conservatively with abx with outpatient GI follow up. He did well following that admission with no issues.   On 10/29, he began to have RUQ pain that prompted an ED visit to [facility]. He felt improved with nausea meds, pain meds, GI cocktail and refused imaging at the time and asked to be discharged home.   On 10/30, he was doing well besides some pain. He called Dr. Chiquita Loth with GI for an Rx for pain pills that could get him through until his follow up appointment which was scheduled for 10/31. Around 1600 he began to have chills. These got worse so he came back to ED.   In ED, he was hypotensive and shocky. He received 3L NS with some improvement in BP during 3rd liter. He also had AKI, lactic acidosis, leukopenia, thrombocytopenia. CT A/P demonstrated acute cholecystitis. Given his constellation of lab abnormalities and concern that his pressure would deteriorate further, PCCM was called for admission to ICU.   He denies any pain or fevers. Did have chills earlier but now resolved. Also had nausea but now resolved. Denies any vomiting. Has associated dyspnea and has required 4L O2 via [place] while in ED.  Pertinent  Medical History  Alcoholic cirrhosis T2DM PVD HLD HTN BPH pAfib CKDIIIa CLL  Significant Hospital Events: Including procedures, antibiotic start and stop dates in addition to other pertinent events   10/30:  Admitted to ICU on levo  Interim History / Subjective:  Tachycardia Afebrile Titrating up to levo  Culture E coli and Klebsiella Feeling SOB and lost his voice  Objective   Blood pressure 106/68, pulse 99, temperature 97.6 F (36.4 C), temperature source Oral, resp. rate (!) 22, weight 113.1 kg, SpO2 95%.        Intake/Output Summary (Last 24 hours) at 12/04/2022 0935 Last data filed at 12/04/2022 0800 Gross per 24 hour  Intake 5769.65 ml  Output 125 ml  Net 5644.65 ml   Filed Weights   12/03/22 1841 12/03/22 2320 12/04/22 0356  Weight: 105 kg 113.1 kg 113.1 kg    Examination: General: Elderly male sitting up in bed HENT: [place]/AT.  Anicteric sclera Lungs: Diminished bases bilaterally.  Mild tachypnea on 4 L nasal cannula Cardiovascular: Tachycardia.  Regular rhythm.  No murmur Abdomen: Distended, right upper and lower quadrant.  + Bowel sounds Extremities: No peripheral edema Neuro: Opens eyes to verbal stimuli.  Answers questions appropriately.  Moves all extremities spontaneously.  Resolved Hospital Problem list     Assessment & Plan:  Septic shock with E coli bacteremia Acute calculous cholecystitis, recurrent - last episode in April 2024 managed conservatively with abx alone with surgery deferred. EtOH cirrhosis Child-Pugh class B, 30% abdominal surgery peri-operative mortality. Not ideal surgical candidate but given recurrent sepsis need evaluation with surgery for source control.  -Bc showed E coli and Klebsiella, narrow abx coverage to CTX -On levo, maintain goal MAP > 65. Weaning  this AM therefore will hold on central line placement at this time -Consult Gen Surg for risk/benefits evaluation. If not a candidate consider IR consultation for PCN tube placement -Loop in GI, seen outpatient -Given increasing SOB, will stop fluids and obtain CXR to evaluation for pulmonary congestion   AKI AGMA, due to septic shock -Trend BMP   Thrombocytopenia, likely  worsening in setting of sepsis -Hold VTW prophylaxis until >50k   T2DM -SSI, BGL goal 140-180 -Hold PTA Insulin, Metformin.   H/o CLL - no baseline cytopenias, followed by Dr. Leonides Schanz. -Monitor CBC  Best Practice (right click and "Reselect all SmartList Selections" daily)   Diet/type: NPO DVT prophylaxis: SCD GI prophylaxis: N/A Lines: N/A Foley:  N/A Code Status:  full code Last date of multidisciplinary goals of care discussion [10/30]  Labs   CBC: Recent Labs  Lab 12/02/22 0446 12/03/22 1850 12/04/22 0031  WBC 7.6 2.9* 9.5  NEUTROABS 4.3 1.9  --   HGB 15.6 14.7 12.6*  HCT 44.6 41.8 36.4*  MCV 94.5 95.9 94.3  PLT 84* 58* 41*    Basic Metabolic Panel: Recent Labs  Lab 12/02/22 0446 12/03/22 1850 12/04/22 0031  NA 139 136 134*  K 4.4 3.9 3.6  CL 105 103 102  CO2 22 14* 19*  GLUCOSE 272* 192* 153*  BUN 18 27* 31*  CREATININE 0.99 1.88* 1.90*  CALCIUM 9.2 8.1* 7.8*  MG  --   --  1.6*  PHOS  --   --  2.1*   GFR: Estimated Creatinine Clearance: 43.5 mL/min (A) (by C-G formula based on SCr of 1.9 mg/dL (H)). Recent Labs  Lab 12/02/22 0446 12/03/22 1850 12/03/22 1902 12/03/22 2116 12/03/22 2259 12/04/22 0031  WBC 7.6 2.9*  --   --   --  9.5  LATICACIDVEN  --   --  9.8* 6.8* 6.0* 3.9*    Liver Function Tests: Recent Labs  Lab 12/02/22 0446 12/03/22 1850  AST 24 36  ALT 14 22  ALKPHOS 76 208*  BILITOT 1.1 2.7*  PROT 7.4 5.5*  ALBUMIN 4.2 3.0*   Recent Labs  Lab 12/02/22 0446  LIPASE 41   No results for input(s): "AMMONIA" in the last 168 hours.  ABG No results found for: "PHART", "PCO2ART", "PO2ART", "HCO3", "TCO2", "ACIDBASEDEF", "O2SAT"   Coagulation Profile: Recent Labs  Lab 12/03/22 1850  INR 1.5*    Cardiac Enzymes: No results for input(s): "CKTOTAL", "CKMB", "CKMBINDEX", "TROPONINI" in the last 168 hours.  HbA1C: Hgb A1c MFr Bld  Date/Time Value Ref Range Status  11/19/2022 10:52 AM 6.7 (H) 4.8 - 5.6 % Final     Comment:    (NOTE) Pre diabetes:          5.7%-6.4%  Diabetes:              >6.4%  Glycemic control for   <7.0% adults with diabetes   10/21/2022 01:59 PM 7.4 (H) 4.6 - 6.5 % Final    Comment:    Glycemic Control Guidelines for People with Diabetes:Non Diabetic:  <6%Goal of Therapy: <7%Additional Action Suggested:  >8%     CBG: Recent Labs  Lab 12/03/22 1837 12/03/22 2312 12/04/22 0341 12/04/22 0730  GLUCAP 176* 159* 140* 149*

## 2022-12-05 ENCOUNTER — Inpatient Hospital Stay (HOSPITAL_COMMUNITY): Payer: Medicare Other

## 2022-12-05 ENCOUNTER — Encounter (HOSPITAL_COMMUNITY): Payer: Self-pay | Admitting: Pulmonary Disease

## 2022-12-05 DIAGNOSIS — A419 Sepsis, unspecified organism: Secondary | ICD-10-CM | POA: Diagnosis not present

## 2022-12-05 DIAGNOSIS — R6521 Severe sepsis with septic shock: Secondary | ICD-10-CM | POA: Diagnosis not present

## 2022-12-05 HISTORY — PX: IR PERC CHOLECYSTOSTOMY: IMG2326

## 2022-12-05 LAB — COMPREHENSIVE METABOLIC PANEL
ALT: 28 U/L (ref 0–44)
AST: 103 U/L — ABNORMAL HIGH (ref 15–41)
Albumin: 2.7 g/dL — ABNORMAL LOW (ref 3.5–5.0)
Alkaline Phosphatase: 57 U/L (ref 38–126)
Anion gap: 14 (ref 5–15)
BUN: 46 mg/dL — ABNORMAL HIGH (ref 8–23)
CO2: 16 mmol/L — ABNORMAL LOW (ref 22–32)
Calcium: 7.7 mg/dL — ABNORMAL LOW (ref 8.9–10.3)
Chloride: 104 mmol/L (ref 98–111)
Creatinine, Ser: 1.71 mg/dL — ABNORMAL HIGH (ref 0.61–1.24)
GFR, Estimated: 40 mL/min — ABNORMAL LOW (ref >60)
Glucose, Bld: 107 mg/dL — ABNORMAL HIGH (ref 70–99)
Potassium: 4.5 mmol/L (ref 3.5–5.1)
Sodium: 134 mmol/L — ABNORMAL LOW (ref 135–145)
Total Bilirubin: 1.3 mg/dL — ABNORMAL HIGH (ref 0.3–1.2)
Total Protein: 5.4 g/dL — ABNORMAL LOW (ref 6.5–8.1)

## 2022-12-05 LAB — GLUCOSE, CAPILLARY
Glucose-Capillary: 104 mg/dL — ABNORMAL HIGH (ref 70–99)
Glucose-Capillary: 131 mg/dL — ABNORMAL HIGH (ref 70–99)
Glucose-Capillary: 163 mg/dL — ABNORMAL HIGH (ref 70–99)
Glucose-Capillary: 86 mg/dL (ref 70–99)
Glucose-Capillary: 96 mg/dL (ref 70–99)
Glucose-Capillary: 98 mg/dL (ref 70–99)

## 2022-12-05 LAB — TYPE AND SCREEN
ABO/RH(D): O POS
Antibody Screen: NEGATIVE

## 2022-12-05 LAB — CBC
HCT: 37.4 % — ABNORMAL LOW (ref 39.0–52.0)
Hemoglobin: 12.9 g/dL — ABNORMAL LOW (ref 13.0–17.0)
MCH: 33.3 pg (ref 26.0–34.0)
MCHC: 34.5 g/dL (ref 30.0–36.0)
MCV: 96.6 fL (ref 80.0–100.0)
Platelets: 42 10*3/uL — ABNORMAL LOW (ref 150–400)
RBC: 3.87 MIL/uL — ABNORMAL LOW (ref 4.22–5.81)
RDW: 15.1 % (ref 11.5–15.5)
WBC: 14.7 10*3/uL — ABNORMAL HIGH (ref 4.0–10.5)
nRBC: 0 % (ref 0.0–0.2)

## 2022-12-05 LAB — PHOSPHORUS: Phosphorus: 3 mg/dL (ref 2.5–4.6)

## 2022-12-05 LAB — MAGNESIUM: Magnesium: 2.4 mg/dL (ref 1.7–2.4)

## 2022-12-05 LAB — PATHOLOGIST SMEAR REVIEW

## 2022-12-05 MED ORDER — THIAMINE HCL 100 MG/ML IJ SOLN
100.0000 mg | Freq: Every day | INTRAMUSCULAR | Status: DC
  Administered 2022-12-06 – 2022-12-07 (×2): 100 mg via INTRAVENOUS
  Filled 2022-12-05 (×3): qty 2

## 2022-12-05 MED ORDER — FENTANYL CITRATE (PF) 100 MCG/2ML IJ SOLN
INTRAMUSCULAR | Status: AC
  Filled 2022-12-05: qty 2

## 2022-12-05 MED ORDER — ADULT MULTIVITAMIN W/MINERALS CH
1.0000 | ORAL_TABLET | Freq: Every day | ORAL | Status: DC
Start: 2022-12-05 — End: 2022-12-10
  Administered 2022-12-05 – 2022-12-10 (×4): 1 via ORAL
  Filled 2022-12-05 (×5): qty 1

## 2022-12-05 MED ORDER — LORAZEPAM 2 MG/ML IJ SOLN
1.0000 mg | Freq: Once | INTRAMUSCULAR | Status: DC
  Filled 2022-12-05: qty 1

## 2022-12-05 MED ORDER — LORAZEPAM 2 MG/ML IJ SOLN
1.0000 mg | Freq: Once | INTRAMUSCULAR | Status: AC
  Administered 2022-12-05: 1 mg via INTRAVENOUS
  Filled 2022-12-05: qty 1

## 2022-12-05 MED ORDER — THIAMINE MONONITRATE 100 MG PO TABS
100.0000 mg | ORAL_TABLET | Freq: Every day | ORAL | Status: DC
  Administered 2022-12-05 – 2022-12-10 (×4): 100 mg via ORAL
  Filled 2022-12-05 (×4): qty 1

## 2022-12-05 MED ORDER — LIDOCAINE HCL 1 % IJ SOLN
INTRAMUSCULAR | Status: AC
Start: 2022-12-05 — End: ?
  Filled 2022-12-05: qty 20

## 2022-12-05 MED ORDER — TECHNETIUM TC 99M MEBROFENIN IV KIT
5.3000 | PACK | Freq: Once | INTRAVENOUS | Status: AC | PRN
  Administered 2022-12-05: 5.3 via INTRAVENOUS

## 2022-12-05 MED ORDER — MELATONIN 3 MG PO TABS
3.0000 mg | ORAL_TABLET | Freq: Every evening | ORAL | Status: DC | PRN
  Administered 2022-12-05 – 2022-12-09 (×2): 3 mg via ORAL
  Filled 2022-12-05 (×2): qty 1

## 2022-12-05 MED ORDER — SODIUM CHLORIDE 0.9 % IV SOLN
2.0000 g | INTRAVENOUS | Status: AC
  Administered 2022-12-05: 2 g via INTRAVENOUS
  Filled 2022-12-05: qty 20

## 2022-12-05 MED ORDER — LORAZEPAM 2 MG/ML IJ SOLN
1.0000 mg | INTRAMUSCULAR | Status: DC | PRN
  Administered 2022-12-05: 2 mg via INTRAVENOUS
  Administered 2022-12-05: 1 mg via INTRAVENOUS
  Administered 2022-12-06: 3 mg via INTRAVENOUS
  Filled 2022-12-05: qty 2
  Filled 2022-12-05: qty 1

## 2022-12-05 MED ORDER — LORAZEPAM 1 MG PO TABS: 1.0000 mg | ORAL_TABLET | ORAL | Status: DC | PRN

## 2022-12-05 MED ORDER — FOLIC ACID 1 MG PO TABS
1.0000 mg | ORAL_TABLET | Freq: Every day | ORAL | Status: DC
  Administered 2022-12-08 – 2022-12-10 (×3): 1 mg via ORAL
  Filled 2022-12-05 (×5): qty 1

## 2022-12-05 MED ORDER — MIDAZOLAM HCL 2 MG/2ML IJ SOLN
INTRAMUSCULAR | Status: AC
  Filled 2022-12-05: qty 2

## 2022-12-05 MED ORDER — GABAPENTIN 300 MG PO CAPS: 300.0000 mg | ORAL_CAPSULE | Freq: Three times a day (TID) | ORAL | Status: DC

## 2022-12-05 NOTE — Progress Notes (Signed)
NAME:  Patrick Kidd, MRN:  [id number], DOB:  12-06-1945, LOS: 2 ADMISSION DATE:  12/03/2022, CONSULTATION DATE:  12/03/2022 REFERRING MD:  Rhae Hammock, CHIEF COMPLAINT:  Hypotension   History of Present Illness:  Patrick Kidd is a 77 y.o. male who has a PMH as below including but not limited to EtOH cirrhosis well controlled and no longer drinking, PUD requiring cautery in the past, DM, cholecystitis. He had admission back in April 2024 for acute calculous cholecystitis and E.coli bacteremia. He was deemed to be at increased risk for surgery; therefore, he was treated conservatively with abx with outpatient GI follow up. He did well following that admission with no issues.   On 10/29, he began to have RUQ pain that prompted an ED visit to [facility]. He felt improved with nausea meds, pain meds, GI cocktail and refused imaging at the time and asked to be discharged home.   On 10/30, he was doing well besides some pain. He called Dr. Chiquita Loth with GI for an Rx for pain pills that could get him through until his follow up appointment which was scheduled for 10/31. Around 1600 he began to have chills. These got worse so he came back to ED.   In ED, he was hypotensive and shocky. He received 3L NS with some improvement in BP during 3rd liter. He also had AKI, lactic acidosis, leukopenia, thrombocytopenia. CT A/P demonstrated acute cholecystitis. Given his constellation of lab abnormalities and concern that his pressure would deteriorate further, PCCM was called for admission to ICU.   He denies any pain or fevers. Did have chills earlier but now resolved. Also had nausea but now resolved. Denies any vomiting. Has associated dyspnea and has required 4L O2 via [place] while in ED.  Pertinent  Medical History  Alcoholic cirrhosis T2DM PVD HLD HTN BPH pAfib CKDIIIa CLL  Significant Hospital Events: Including procedures, antibiotic start and stop dates in addition to other pertinent events   10/30:  Admitted to ICU on levo 10/31 Weaned off pressors, Klebsiella and e coli bacteremia  Interim History / Subjective:  Hida scan today. No surgery recommended given cirrhotic. IR balked at cholecystotomy until more imaging. Remains off pressors.   Objective   Blood pressure 131/74, pulse 100, temperature 97.6 F (36.4 C), temperature source Axillary, resp. rate (!) 26, weight 113.1 kg, SpO2 97%.        Intake/Output Summary (Last 24 hours) at 12/05/2022 0906 Last data filed at 12/05/2022 0000 Gross per 24 hour  Intake 1284.54 ml  Output 275 ml  Net 1009.54 ml   Filed Weights   12/03/22 1841 12/03/22 2320 12/04/22 0356  Weight: 105 kg 113.1 kg 113.1 kg    Examination: General: Elderly male sitting up in bed HENT: [place]/AT.  Anicteric sclera Lungs: Diminished bases bilaterally.  Mild tachypnea on 4 L nasal cannula Cardiovascular: Tachycardia.  Regular rhythm.  No murmur Abdomen: Distended, right upper and lower quadrant.  + Bowel sounds Extremities: No peripheral edema Neuro: Opens eyes to verbal stimuli.  Answers questions appropriately.  Moves all extremities spontaneously.  Resolved Hospital Problem list     Assessment & Plan:   Septic shock due to cholecystitis with E. coli and Klebsiella bacteremia: Lack of bili elevation and presence of RUQ pain make ascending cholangitis unlikely. HIDA scan with cholecystitis 05/2022. Needs definitive management given recurrence and bacteremia. -- Ceftriaxone 2 g IV daily for bacteremia -- Surgery consult, appreciate assistance - no surgery as cirrhotic per recommendation -- IR consult  for cholecystotomy -- MAP goal greater than 65, vasopressors as needed, weaned off in the morning 10/31 -- Status post adequate fluid resuscitation   Acute kidney injury: Suspect ATN in setting of sepsis, hypovolemia possible now adequate fluid resuscitated.  No hydronephrosis on CT abdomen pelvis. -- repeat BMP, s/p adequate hydration   Diabetes with  hyperglycemia: on 50 units long acting at home -- SSI --carb modified once taking PO   Thrombocytopenia: In setting of CLL, worsened with consumption of sepsis -- SCDs, hold chemoprophylaxis until it is greater than 50 -- will transfuse if invasive procedures (perc chole tube) are recommended  Cirrhosis: Child Pugh A (6 points) --well compensated, feel would tolerate procedures  Best Practice (right click and "Reselect all SmartList Selections" daily)   Diet/type: NPO DVT prophylaxis: SCD GI prophylaxis: N/A Lines: N/A Foley:  N/A Code Status:  full code Last date of multidisciplinary goals of care discussion [Patient updated]  Labs   CBC: Recent Labs  Lab 12/02/22 0446 12/03/22 1850 12/04/22 0031  WBC 7.6 2.9* 9.5  NEUTROABS 4.3 1.9  --   HGB 15.6 14.7 12.6*  HCT 44.6 41.8 36.4*  MCV 94.5 95.9 94.3  PLT 84* 58* 41*    Basic Metabolic Panel: Recent Labs  Lab 12/02/22 0446 12/03/22 1850 12/04/22 0031  NA 139 136 134*  K 4.4 3.9 3.6  CL 105 103 102  CO2 22 14* 19*  GLUCOSE 272* 192* 153*  BUN 18 27* 31*  CREATININE 0.99 1.88* 1.90*  CALCIUM 9.2 8.1* 7.8*  MG  --   --  1.6*  PHOS  --   --  2.1*   GFR: Estimated Creatinine Clearance: 43.5 mL/min (A) (by C-G formula based on SCr of 1.9 mg/dL (H)). Recent Labs  Lab 12/02/22 0446 12/03/22 1850 12/03/22 1902 12/03/22 2116 12/03/22 2259 12/04/22 0031  WBC 7.6 2.9*  --   --   --  9.5  LATICACIDVEN  --   --  9.8* 6.8* 6.0* 3.9*    Liver Function Tests: Recent Labs  Lab 12/02/22 0446 12/03/22 1850  AST 24 36  ALT 14 22  ALKPHOS 76 208*  BILITOT 1.1 2.7*  PROT 7.4 5.5*  ALBUMIN 4.2 3.0*   Recent Labs  Lab 12/02/22 0446  LIPASE 41   No results for input(s): "AMMONIA" in the last 168 hours.  ABG No results found for: "PHART", "PCO2ART", "PO2ART", "HCO3", "TCO2", "ACIDBASEDEF", "O2SAT"   Coagulation Profile: Recent Labs  Lab 12/03/22 1850  INR 1.5*    Cardiac Enzymes: No results for  input(s): "CKTOTAL", "CKMB", "CKMBINDEX", "TROPONINI" in the last 168 hours.  HbA1C: Hgb A1c MFr Bld  Date/Time Value Ref Range Status  11/19/2022 10:52 AM 6.7 (H) 4.8 - 5.6 % Final    Comment:    (NOTE) Pre diabetes:          5.7%-6.4%  Diabetes:              >6.4%  Glycemic control for   <7.0% adults with diabetes   10/21/2022 01:59 PM 7.4 (H) 4.6 - 6.5 % Final    Comment:    Glycemic Control Guidelines for People with Diabetes:Non Diabetic:  <6%Goal of Therapy: <7%Additional Action Suggested:  >8%     CBG: Recent Labs  Lab 12/04/22 1510 12/04/22 1910 12/04/22 2313 12/05/22 0329 12/05/22 0709  GLUCAP 146* 156* 108* 98 86   Karren Burly, MD See Loretha Stapler

## 2022-12-05 NOTE — Progress Notes (Signed)
Per Radiology during HIDA scan they were unable to complete due to pt movement. Per Ruthy Dick DO and Ralene Muskrat PA, OK to hold on HIDA scan completion, will be proceeding with IR procedure. Pt and family made aware. Pt remains NPO.

## 2022-12-05 NOTE — Progress Notes (Signed)
eLink Physician-Brief Progress Note Patient Name: Patrick Kidd DOB: 09/20/1945 MRN: [id number]   Date of Service  12/05/2022  HPI/Events of Note  anxiety  eICU Interventions  Pharmacological agent   Anxiety/ Insomnia  Melatonin ygiven, No relief Lorazepam one mgX1 given   Intervention Category Minor Interventions: Agitation / anxiety - evaluation and management  Massie Maroon 12/05/2022, 12:07 AM

## 2022-12-05 NOTE — Progress Notes (Addendum)
Called by nursing, patient very agitated and confused. Unclear if patient has been this confused prior, significant confusion not documented in notes.  However, speaking to patient's wife, patient has been intermittently confused since admission.  Question of alcohol use/withdrawal.  Wife states patient does not drink been added as far as she knows.  On the off chance patient does drinker, CIWA protocol initiated.  Mag, phosphate levels added.  Bladder scan done, no retention.  UA ordered.  CBC, CMP, ammonia level ordered.  Abdomen distended with decreased bowel sounds but this appears baseline.  Relatively benign abdomen.  KUB, chest x-ray done negative.  Ativan does not appear to be helping.  Haldol IM ordered.   Patient likely just needs to have infection drained.  On Rocephin.  WBC increased compared to the admission.  Awaiting results of labs ordered Wife plans to come to bedside. Patient in 4-point restraints.  Aspiration precautions  CXR developing right basilar opacity, atelectasis vs infiltrate Troponin 573, repeat troponin pending at 7AM. EKG, Ck, Ckmb ordered IV doxy added

## 2022-12-05 NOTE — Progress Notes (Signed)
[facility] Progress Note     Subjective: CC:  Alert, cooperative, NAD. Denies pain at rest. Wife at bedside. Off of low dose pressor support About to leave his room for HIDA scan  Objective: Vital signs in last 24 hours: Temp:  [97.6 F (36.4 C)-98.4 F (36.9 C)] 97.6 F (36.4 C) (11/01 0710) Pulse Rate:  [92-187] 92 (11/01 0710) Resp:  [3-30] 20 (11/01 0710) BP: (74-131)/(49-98) 114/62 (11/01 0700) SpO2:  [91 %-100 %] 100 % (11/01 0710) Last BM Date : 12/04/22 (per patient)  Intake/Output from previous day: 10/31 0701 - 11/01 0700 In: 1713.8 [P.O.:240; I.V.:874.4; IV Piggyback:599.4] Out: 275 [Urine:275] Intake/Output this shift: No intake/output data recorded.  PE: Gen:  Alert, NAD, pleasant Card:  Regular rate and rhythm - HR 90's Pulm:  Normal effort on nasal cannula Abd: Soft, protuberant, TTP R flank at the costal margin without guarding or peritonitis  Skin: warm and dry, ecchymosis of upper extremities Psych: A&Ox3   Lab Results:  Recent Labs    12/03/22 1850 12/04/22 0031  WBC 2.9* 9.5  HGB 14.7 12.6*  HCT 41.8 36.4*  PLT 58* 41*   BMET Recent Labs    12/03/22 1850 12/04/22 0031  NA 136 134*  K 3.9 3.6  CL 103 102  CO2 14* 19*  GLUCOSE 192* 153*  BUN 27* 31*  CREATININE 1.88* 1.90*  CALCIUM 8.1* 7.8*   PT/INR Recent Labs    12/03/22 1850  LABPROT 18.0*  INR 1.5*   CMP     Component Value Date/Time   NA 134 (L) 12/04/2022 0031   K 3.6 12/04/2022 0031   CL 102 12/04/2022 0031   CO2 19 (L) 12/04/2022 0031   GLUCOSE 153 (H) 12/04/2022 0031   BUN 31 (H) 12/04/2022 0031   CREATININE 1.90 (H) 12/04/2022 0031   CREATININE 0.95 11/19/2022 1052   CREATININE 1.13 06/04/2010 1354   CALCIUM 7.8 (L) 12/04/2022 0031   PROT 5.5 (L) 12/03/2022 1850   ALBUMIN 3.0 (L) 12/03/2022 1850   AST 36 12/03/2022 1850   AST 21 11/19/2022 1052   ALT 22 12/03/2022 1850   ALT 15 11/19/2022 1052   ALKPHOS 208 (H) 12/03/2022 1850   BILITOT  2.7 (H) 12/03/2022 1850   BILITOT 1.6 (H) 11/19/2022 1052   GFRNONAA 36 (L) 12/04/2022 0031   GFRNONAA >60 11/19/2022 1052   GFRAA >60 02/06/2019 0546   Lipase     Component Value Date/Time   LIPASE 41 12/02/2022 0446       Studies/Results: DG CHEST PORT 1 VIEW  Result Date: 12/04/2022 CLINICAL DATA:  Shortness of breath.  Sepsis EXAM: PORTABLE CHEST 1 VIEW COMPARISON:  X-ray 12/03/2022 and older FINDINGS: Underinflation. Small effusions. Adjacent mild opacity is stable. No pneumothorax. Stable enlarged cardiopericardial silhouette. No edema. Overlapping cardiac leads. Degenerative changes. IMPRESSION: No significant interval change when adjusting for technique. Electronically Signed   By: Karen Kays M.D.   On: 12/04/2022 14:02   US Abdomen Limited RUQ (LIVER/GB)  Result Date: 12/04/2022 CLINICAL DATA:  Cholecystitis EXAM: ULTRASOUND ABDOMEN LIMITED RIGHT UPPER QUADRANT COMPARISON:  Ultrasound 05/30/2022 and CT scan 12/03/2022. FINDINGS: Gallbladder: Distended gallbladder. Slight wall thickening but the patient has a known chronic liver disease. Previous areas of stone is not well appreciated today. This could be technical. Common bile duct: Diameter: 3 mm Liver: Heterogeneous liver consistent with known chronic liver disease. There is a anechoic rounded structure with through transmission in the liver measuring 6.5 cm consistent with a  cyst. Portal vein is patent on color Doppler imaging with normal direction of blood flow towards the liver. Other: None. IMPRESSION: Nodular echogenic liver consistent with known chronic liver disease. Hepatic cysts. Please correlate with the prior CT scan. Distended gallbladder with wall thickening but nonspecific in the presence of chronic liver disease. There is some sludge. The stone seen previously are not as well seen on this ultrasound but are likely present based on prior imaging. If there is further concern of acute cholecystitis a HIDA scan could  be considered as clinically appropriate for further delineation. Electronically Signed   By: Karen Kays M.D.   On: 12/04/2022 14:01   CT ABDOMEN PELVIS W CONTRAST  Result Date: 12/03/2022 CLINICAL DATA:  Abdomen pain EXAM: CT ABDOMEN AND PELVIS WITH CONTRAST TECHNIQUE: Multidetector CT imaging of the abdomen and pelvis was performed using the standard protocol following bolus administration of intravenous contrast. RADIATION DOSE REDUCTION: This exam was performed according to the departmental dose-optimization program which includes automated exposure control, adjustment of the mA and/or kV according to patient size and/or use of iterative reconstruction technique. CONTRAST:  75mL OMNIPAQUE IOHEXOL 350 MG/ML SOLN COMPARISON:  CT 05/30/2022 FINDINGS: Lower chest: Lung bases demonstrate small right-sided pleural effusion. Small right cardio phrenic lymph nodes without significant change. Hepatobiliary: Cirrhotic morphology of the liver. 6.1 cm right hepatic cyst for which no specific imaging follow-up is recommended. Mildly distended gallbladder with multiple stones. Possible gallbladder wall thickening. Trace pericholecystic fluid but there is perihepatic ascites Pancreas: Unremarkable. No pancreatic ductal dilatation or surrounding inflammatory changes. Spleen: Enlarged at 16 cm. Adrenals/Urinary Tract: Adrenal glands are normal. Kidneys show no hydronephrosis. The bladder is unremarkable. No excretion of contrast on delayed views of the kidneys. Stomach/Bowel: Stomach nonenlarged. No dilated small bowel. Diverticular disease of the colon. Question mild wall thickening and inflammation at the hepatic flexure on coronal series 6, image 123 through 151. Negative appendix. Vascular/Lymphatic: Moderate aortic atherosclerosis. No aneurysm. No suspicious lymph nodes. Reproductive: Multiple prostate seeds. Other: Negative for pelvic effusion or free air. Small volume right upper quadrant ascites Musculoskeletal: No  acute or suspicious osseous abnormality. Multilevel degenerative changes IMPRESSION: 1. 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 2. Mildly distended gallbladder with multiple stones. Possible gallbladder wall thickening and trace pericholecystic fluid but there is perihepatic ascites. Correlate clinically for acute cholecystitis. Follow-up ultrasound if deemed clinically appropriate. 3. Diverticular disease of the colon. Question mild wall thickening and inflammation at the hepatic flexure of the colon, as may be seen with mild right-sided diverticulitis. 4. Small right pleural effusion. 5. Aortic atherosclerosis. Aortic Atherosclerosis (ICD10-I70.0). Electronically Signed   By: Jasmine Pang M.D.   On: 12/03/2022 21:15   DG Chest Port 1 View  Result Date: 12/03/2022 CLINICAL DATA:  Shortness of breath and fever EXAM: PORTABLE CHEST 1 VIEW COMPARISON:  01/28/2019 FINDINGS: Low lung volumes. Subsegmental atelectasis at the bases. Stable cardiomediastinal silhouette. No pneumothorax IMPRESSION: Low lung volumes with subsegmental atelectasis at the bases. Electronically Signed   By: Jasmine Pang M.D.   On: 12/03/2022 20:23    Anti-infectives: Anti-infectives (From admission, onward)    Start     Dose/Rate Route Frequency Ordered Stop   12/04/22 0945  cefTRIAXone (ROCEPHIN) 2 g in sodium chloride 0.9 % 100 mL IVPB        2 g 200 mL/hr over 30 Minutes Intravenous Every 24 hours 12/04/22 0849     12/04/22 0600  piperacillin-tazobactam (ZOSYN) IVPB 3.375 g  Status:  Discontinued        3.375 g 12.5 mL/hr over 240 Minutes Intravenous Every 8 hours 12/03/22 2231 12/04/22 0849   12/03/22 2231  vancomycin variable dose per unstable renal function (pharmacist dosing)  Status:  Discontinued         Does not apply See admin instructions 12/03/22 2231 12/04/22 0849   12/03/22 1900  ceFEPIme (MAXIPIME) 2 g in sodium chloride 0.9 % 100 mL IVPB        2 g 200 mL/hr over 30  Minutes Intravenous  Once 12/03/22 1849 12/03/22 2025   12/03/22 1900  metroNIDAZOLE (FLAGYL) IVPB 500 mg        500 mg 100 mL/hr over 60 Minutes Intravenous  Once 12/03/22 1849 12/03/22 2131   12/03/22 1900  vancomycin (VANCOCIN) IVPB 1000 mg/200 mL premix  Status:  Discontinued        1,000 mg 200 mL/hr over 60 Minutes Intravenous  Once 12/03/22 1849 12/03/22 1851   12/03/22 1900  vancomycin (VANCOCIN) 2,500 mg in sodium chloride 0.9 % 500 mL IVPB        2,500 mg 262.5 mL/hr over 120 Minutes Intravenous  Once 12/03/22 1853 12/03/22 2256        Assessment/Plan 77 y/o M with possible recurrent cholecystitis in the setting of ETOH cirrhosis w/ splenomegaly, thrombocytopenia, and small volume ascites on CT  - afebrile, HR 90's, no labs this AM  - HIDA today - IV abx - Currently MELD score 21 and Child Class B. He is high risk for cholecystectomy. Treatment plan pending HIDA results.    LOS: 2 days   I reviewed nursing notes, hospitalist notes, last 24 h vitals and pain scores, last 48 h intake and output, last 24 h labs and trends, and last 24 h imaging results.  This care required moderate level of medical decision making.   Hosie Spangle, PA-C [facility] Please see Amion for pager number during day hours 7:00am-4:30pm

## 2022-12-05 NOTE — Progress Notes (Signed)
Interventional Radiology progress Note  Mr Patrick Kidd presents to VIR suite for image guided perc chole.   Plan for local anesthesia and moderate sedation.   Patient attempt to lie flat, and  then sat up.  Stating he "cannot lie down".  He does not want to try again.   He is alert to his person, and his birthdate.  He knows the current year, and the current month. He cannot tell me accurately the state or city [facility] [place] where we are.   We cannot proceed without him participating.  He is withdrawing his consent.   We will defer.  If perc chole is needed, we will need to have general anesthesia assist, and he will potentially need a caregiver to consent on his behalf.    He will return to room.   Signed,  Yvone Neu. Loreta Ave, DO, ABVM, RPVI

## 2022-12-05 NOTE — Consult Note (Signed)
Chief Complaint: Patient was seen in consultation today for percutaneous cholecystostomy drain placement Chief Complaint  Patient presents with   Fever    Fever chills and diaphoresis onset around 1600 today. Pt was seen at [facility] yesterday. Hx of cirrhosis of the liver and gallstones. Abd distention noted. Low grade fever pta 100.4. EMS reports pt HR 150 pta and sating 83 percent on RA. Pt given bolus and placed on 3L [place]. Pt systolic 90s to 100s and tachypneic in the 30s pta.   at the request of * No referring provider recorded for this case *  Referring Physician(s): Dr Alfonzo Beers  Supervising Physician: Gilmer Mor  Patient Status: [facility] - In-pt  History of Present Illness: Patrick Kidd is a 77 y.o. male   FULL Code status per pt and family  Presented to ED 10/30 with abd pain and fever; SOB x 2 weeks Had come to ED one day earlier--- refused imaging Worsening pain Previously, cholecystitis was determined; Ecoli bacteremia in April 2024--- was treated and sxs resolved [place] also concerning for cholecystitis he is considered high risk for surgical intervention and recommend ongoing antibiotics and further consideration of percutaneous cholecystostomy tube though this understandably is not low risk either. Could also consider repeat HIDA if findings remain equivocal   [place] yesterday: Distended gallbladder with wall thickening but nonspecific in the presence of chronic liver disease. There is some sludge. The stone seen previously are not as well seen on this ultrasound but are likely present based on prior imaging. If there is further concern of acute cholecystitis a HIDA scan could be considered as clinically appropriate for further delineation. HIDA- ordered but pt was unable to tolerate  Moving forward with percutaneous cholecystostomy drain in IR Imaging and approved with Dr Loreta Ave   Past Medical History:  Diagnosis Date   Acrophobia    Alcoholic cirrhosis (HCC)  10/04/2015   Anemia    Arthritis    foot by big toe   BPH associated with nocturia    Cataract    removed both eyes   Cholecystitis 05/2022   tx Abx   Chronic cough    PMH of   CLL (chronic lymphocytic leukemia) (HCC)    COVID-19 12/2018   Diabetes mellitus without complication (HCC)    Diverticulosis 07/03/2010   Colonoscopy.    Duodenal ulcer 2017   Fallen arches    Bilateral   GERD (gastroesophageal reflux disease)    Gout    Granuloma annulare    Hx of adenomatous colonic polyps multiple   Hydrocele 2011   Large septated right hydrocele   Liver cyst    Liver lesion    Nonspecific elevation of levels of transaminase or lactic acid dehydrogenase (LDH)    Obesity    Peripheral neuropathy    Plantar fasciitis    PMH of   Portal hypertension (HCC) 2017   Prostate cancer (HCC)    Right shoulder pain 11/2017   Sleep apnea    no cpap, patient denies   Thiamine deficiency    Wears reading eyeglasses    WPW (Wolff-Parkinson-White syndrome) 02/05/2019    Past Surgical History:  Procedure Laterality Date   BACK SURGERY  04/28/2022   CATARACT EXTRACTION, BILATERAL  12/2011   Dr Nile Riggs   COLONOSCOPY  2017   COLONOSCOPY W/ POLYPECTOMY  07/03/2010   2 adenomas, diverticulosis on right. Dr Leone Payor   CYSTOSCOPY N/A 03/15/2018   Procedure: CYSTOSCOPY FLEXIBLE;  Surgeon: Crista Elliot,  MD;  Location: [facility];  Service: Urology;  Laterality: N/A;  NO SEEDS FOUND IN BLADDER   ESOPHAGOGASTRODUODENOSCOPY (EGD) WITH PROPOFOL N/A 01/08/2016   Procedure: ESOPHAGOGASTRODUODENOSCOPY (EGD) WITH PROPOFOL;  Surgeon: Meryl Dare, MD;  Location: [facility];  Service: Endoscopy;  Laterality: N/A;   FLEXIBLE SIGMOIDOSCOPY  2000   RADIOACTIVE SEED IMPLANT N/A 03/15/2018   Procedure: RADIOACTIVE SEED IMPLANT/BRACHYTHERAPY IMPLANT;  Surgeon: Crista Elliot, MD;  Location: [facility];  Service: Urology;  Laterality: N/A;   73 SEEDS IMPLANTED    SIGMOIDOSCOPY     SPACE OAR INSTILLATION N/A 03/15/2018   Procedure: SPACE OAR INSTILLATION;  Surgeon: Crista Elliot, MD;  Location: [facility];  Service: Urology;  Laterality: N/A;   WISDOM TOOTH EXTRACTION      Allergies: Rifaximin, Beta adrenergic blockers, and Calcium channel blockers  Medications: Prior to Admission medications   Medication Sig Start Date End Date Taking? Authorizing Provider  acetaminophen (TYLENOL) 500 MG tablet Take 1 tablet (500 mg total) by mouth every 6 (six) hours as needed. Patient taking differently: Take 1,000 mg by mouth every 6 (six) hours as needed for moderate pain (pain score 4-6) or mild pain (pain score 1-3). 12/02/22  Yes Derwood Kaplan, MD  amoxicillin-clavulanate (AUGMENTIN) 875-125 MG tablet Take 1 tablet by mouth every 12 (twelve) hours. 12/02/22  Yes Derwood Kaplan, MD  Continuous Blood Gluc Receiver (FREESTYLE LIBRE 3 READER) DEVI 1 Act by Does not apply route daily. 05/07/22  Yes Etta Grandchild, MD  Continuous Blood Gluc Sensor (FREESTYLE LIBRE 3 SENSOR) MISC 1 Act by Does not apply route daily. Place 1 sensor on the skin every 14 days. Use to check glucose continuously 05/07/22  Yes Etta Grandchild, MD  gabapentin (NEURONTIN) 300 MG capsule Take 1 capsule (300 mg total) by mouth 3 (three) times daily. 09/16/22  Yes Jaffe, Adam R, DO  HYDROcodone-acetaminophen (NORCO/VICODIN) 5-325 MG tablet Take 1 tablet by mouth every 6 (six) hours as needed for severe pain (pain score 7-10). 12/03/22  Yes Iva Boop, MD  insulin glargine, 2 Unit Dial, (TOUJEO MAX SOLOSTAR) 300 UNIT/ML Solostar Pen Inject 50 Units into the skin daily. Via [facility] pt assistance Patient taking differently: Inject 50 Units into the skin daily after breakfast. Via [facility] pt assistance 05/08/22  Yes Etta Grandchild, MD  Insulin Pen Needle (PEN NEEDLES) 32G X 5 MM MISC Inject 1 Act into the skin daily. Use to administer insulin. Dx E11.9 Dispense based on insurance  preference. 05/08/22  Yes Etta Grandchild, MD  Lancets (ACCU-CHEK MULTICLIX) lancets Use to check sugar daily. DX: E11.8 12/21/17  Yes Etta Grandchild, MD  metFORMIN (GLUCOPHAGE) 500 MG tablet TAKE 1 TABLET BY MOUTH 2 TIMES DAILY WITH A MEAL. 07/02/22  Yes Etta Grandchild, MD  Multiple Vitamins-Minerals (CENTRUM SILVER 50+MEN) TABS Take 1 tablet by mouth daily.   Yes [provider]  pantoprazole (PROTONIX) 40 MG tablet Take 1 tablet (40 mg total) by mouth 2 (two) times daily. TAKE 1 TABLET 30 MINUTES BEFORE MEALS (BREAKFAST AND SUPPER) TWICE A DAY 10/24/22  Yes Etta Grandchild, MD     Family History  Problem Relation Age of Onset   Lung cancer Mother        smoker   Leukemia Father        Acute myelocytic   Diabetes Neg Hx    Stroke Neg Hx    Heart disease Neg Hx  Colon cancer Neg Hx    Colon polyps Neg Hx    Esophageal cancer Neg Hx    Rectal cancer Neg Hx    Stomach cancer Neg Hx     Social History   Socioeconomic History   Marital status: Married    Spouse name: [name]   Number of children: 2   Years of education: Not on file   Highest education level: Some college, no degree  Occupational History   Occupation: Multimedia programmer estate  Tobacco Use   Smoking status: Former    Current packs/day: 0.00    Average packs/day: 2.0 packs/day for 6.0 years (12.0 ttl pk-yrs)    Types: Cigarettes    Start date: 02/03/1965    Quit date: 02/04/1971    Years since quitting: 51.8    Passive exposure: Never   Smokeless tobacco: Never   Tobacco comments:    smoked age [age], up to 2 ppd  Vaping Use   Vaping status: Never Used  Substance and Sexual Activity   Alcohol use: Not Currently    Comment: No alochol since Christmas Day 2020   Drug use: No   Sexual activity: Yes    Partners: Female  Other Topics Concern   Not on file  Social History Narrative   Worked in Multimedia programmer estate   2 children   Former smoker no drug use   Alcoholism, abstinent since 01/23/2019 most  recently   Fun/Hobby: Play golf, grandchildren, [employer]      Patient is left-handed. He lives with his wife in a one level home. He swims most days.   Social Determinants of Health   Financial Resource Strain: Low Risk  (12/02/2021)   Overall Financial Resource Strain (CARDIA)    Difficulty of Paying Living Expenses: Not hard at all  Food Insecurity: No Food Insecurity (06/05/2022)   Hunger Vital Sign    Worried About Running Out of Food in the Last Year: Never true    Ran Out of Food in the Last Year: Never true  Transportation Needs: No Transportation Needs (06/05/2022)   PRAPARE - Administrator, Civil Service (Medical): No    Lack of Transportation (Non-Medical): No  Physical Activity: Inactive (12/02/2021)   Exercise Vital Sign    Days of Exercise per Week: 0 days    Minutes of Exercise per Session: 0 min  Stress: No Stress Concern Present (12/02/2021)   [employer] of Occupational Health - Occupational Stress Questionnaire    Feeling of Stress : Not at all  Social Connections: Moderately Integrated (12/02/2021)   Social Connection and Isolation Panel [NHANES]    Frequency of Communication with Friends and Family: More than three times a week    Frequency of Social Gatherings with Friends and Family: More than three times a week    Attends Religious Services: Never    Database administrator or Organizations: Yes    Attends Engineer, structural: More than 4 times per year    Marital Status: Married     Review of Systems: A 12 point ROS discussed and pertinent positives are indicated in the HPI above.  All other systems are negative.  Review of Systems  Constitutional:  Positive for activity change, appetite change and fever.  Respiratory:  Positive for shortness of breath.   Gastrointestinal:  Positive for abdominal pain.  Psychiatric/Behavioral:  Positive for decreased concentration. Negative for behavioral problems and confusion.     Vital  Signs: BP 125/70  Pulse 95   Temp 97.6 F (36.4 C) (Axillary)   Resp (!) 27   Wt 249 lb 5.4 oz (113.1 kg)   SpO2 98%   BMI 31.17 kg/m   Advance Care Plan: The advanced care plan/surrogate decision maker was discussed at the time of visit and documented in the medical record.    Physical Exam Vitals reviewed.  Constitutional:      Comments: Groggy---wife in room  HENT:     Mouth/Throat:     Mouth: Mucous membranes are moist.  Cardiovascular:     Rate and Rhythm: Normal rate and regular rhythm.     Heart sounds: Normal heart sounds.  Pulmonary:     Comments: Shallow breaths Abdominal:     Tenderness: There is abdominal tenderness.     Comments: ++definite RUQ pain  Skin:    General: Skin is warm.  Neurological:     Comments: Groggy--- in and out sleep  Psychiatric:     Comments: Wife is at bedside--- consented for procedure     Imaging: DG CHEST PORT 1 VIEW  Result Date: 12/04/2022 CLINICAL DATA:  Shortness of breath.  Sepsis EXAM: PORTABLE CHEST 1 VIEW COMPARISON:  X-ray 12/03/2022 and older FINDINGS: Underinflation. Small effusions. Adjacent mild opacity is stable. No pneumothorax. Stable enlarged cardiopericardial silhouette. No edema. Overlapping cardiac leads. Degenerative changes. IMPRESSION: No significant interval change when adjusting for technique. Electronically Signed   By: Karen Kays M.D.   On: 12/04/2022 14:02   US Abdomen Limited RUQ (LIVER/GB)  Result Date: 12/04/2022 CLINICAL DATA:  Cholecystitis EXAM: ULTRASOUND ABDOMEN LIMITED RIGHT UPPER QUADRANT COMPARISON:  Ultrasound 05/30/2022 and CT scan 12/03/2022. FINDINGS: Gallbladder: Distended gallbladder. Slight wall thickening but the patient has a known chronic liver disease. Previous areas of stone is not well appreciated today. This could be technical. Common bile duct: Diameter: 3 mm Liver: Heterogeneous liver consistent with known chronic liver disease. There is a anechoic rounded structure with  through transmission in the liver measuring 6.5 cm consistent with a cyst. Portal vein is patent on color Doppler imaging with normal direction of blood flow towards the liver. Other: None. IMPRESSION: Nodular echogenic liver consistent with known chronic liver disease. Hepatic cysts. Please correlate with the prior CT scan. Distended gallbladder with wall thickening but nonspecific in the presence of chronic liver disease. There is some sludge. The stone seen previously are not as well seen on this ultrasound but are likely present based on prior imaging. If there is further concern of acute cholecystitis a HIDA scan could be considered as clinically appropriate for further delineation. Electronically Signed   By: Karen Kays M.D.   On: 12/04/2022 14:01   CT ABDOMEN PELVIS W CONTRAST  Result Date: 12/03/2022 CLINICAL DATA:  Abdomen pain EXAM: CT ABDOMEN AND PELVIS WITH CONTRAST TECHNIQUE: Multidetector CT imaging of the abdomen and pelvis was performed using the standard protocol following bolus administration of intravenous contrast. RADIATION DOSE REDUCTION: This exam was performed according to the departmental dose-optimization program which includes automated exposure control, adjustment of the mA and/or kV according to patient size and/or use of iterative reconstruction technique. CONTRAST:  75mL OMNIPAQUE IOHEXOL 350 MG/ML SOLN COMPARISON:  CT 05/30/2022 FINDINGS: Lower chest: Lung bases demonstrate small right-sided pleural effusion. Small right cardio phrenic lymph nodes without significant change. Hepatobiliary: Cirrhotic morphology of the liver. 6.1 cm right hepatic cyst for which no specific imaging follow-up is recommended. Mildly distended gallbladder with multiple stones. Possible gallbladder wall thickening. Trace pericholecystic fluid but  there is perihepatic ascites Pancreas: Unremarkable. No pancreatic ductal dilatation or surrounding inflammatory changes. Spleen: Enlarged at 16 cm.  Adrenals/Urinary Tract: Adrenal glands are normal. Kidneys show no hydronephrosis. The bladder is unremarkable. No excretion of contrast on delayed views of the kidneys. Stomach/Bowel: Stomach nonenlarged. No dilated small bowel. Diverticular disease of the colon. Question mild wall thickening and inflammation at the hepatic flexure on coronal series 6, image 123 through 151. Negative appendix. Vascular/Lymphatic: Moderate aortic atherosclerosis. No aneurysm. No suspicious lymph nodes. Reproductive: Multiple prostate seeds. Other: Negative for pelvic effusion or free air. Small volume right upper quadrant ascites Musculoskeletal: No acute or suspicious osseous abnormality. Multilevel degenerative changes IMPRESSION: 1. 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 2. Mildly distended gallbladder with multiple stones. Possible gallbladder wall thickening and trace pericholecystic fluid but there is perihepatic ascites. Correlate clinically for acute cholecystitis. Follow-up ultrasound if deemed clinically appropriate. 3. Diverticular disease of the colon. Question mild wall thickening and inflammation at the hepatic flexure of the colon, as may be seen with mild right-sided diverticulitis. 4. Small right pleural effusion. 5. Aortic atherosclerosis. Aortic Atherosclerosis (ICD10-I70.0). Electronically Signed   By: Jasmine Pang M.D.   On: 12/03/2022 21:15   DG Chest Port 1 View  Result Date: 12/03/2022 CLINICAL DATA:  Shortness of breath and fever EXAM: PORTABLE CHEST 1 VIEW COMPARISON:  01/28/2019 FINDINGS: Low lung volumes. Subsegmental atelectasis at the bases. Stable cardiomediastinal silhouette. No pneumothorax IMPRESSION: Low lung volumes with subsegmental atelectasis at the bases. Electronically Signed   By: Jasmine Pang M.D.   On: 12/03/2022 20:23    Labs:  CBC: Recent Labs    11/19/22 1052 12/02/22 0446 12/03/22 1850 12/04/22 0031  WBC 8.4 7.6 2.9* 9.5  HGB  16.2 15.6 14.7 12.6*  HCT 45.7 44.6 41.8 36.4*  PLT 110* 84* 58* 41*    COAGS: Recent Labs    05/30/22 1717 06/10/22 0959 12/03/22 1850  INR 1.2 1.3* 1.5*  APTT  --   --  32    BMP: Recent Labs    11/19/22 1052 12/02/22 0446 12/03/22 1850 12/04/22 0031  NA 139 139 136 134*  K 4.6 4.4 3.9 3.6  CL 101 105 103 102  CO2 28 22 14* 19*  GLUCOSE 132* 272* 192* 153*  BUN 12 18 27* 31*  CALCIUM 9.0 9.2 8.1* 7.8*  CREATININE 0.95 0.99 1.88* 1.90*  GFRNONAA >60 >60 36* 36*    LIVER FUNCTION TESTS: Recent Labs    06/10/22 0959 11/19/22 1052 12/02/22 0446 12/03/22 1850  BILITOT 1.2 1.6* 1.1 2.7*  AST 21 21 24  36  ALT 11 15 14 22   ALKPHOS 232* 70 76 208*  PROT 6.2 7.1 7.4 5.5*  ALBUMIN 3.3* 4.2 4.2 3.0*    TUMOR MARKERS: No results for input(s): "AFPTM", "CEA", "CA199", "CHROMGRNA" in the last 8760 hours.  Assessment and Plan:  Scheduled for percutaneous cholecystostomy drain placement Risks and benefits discussed with the patient including, but not limited to bleeding, infection, gallbladder perforation, bile leak, sepsis or even death.  All of the patient's questions were answered, patient is agreeable to proceed. Consent signed and in chart.  Thank you for this interesting consult.  I greatly enjoyed meeting CHAUNCEY SCIULLI and look forward to participating in their care.  A copy of this report was sent to the requesting provider on this date.  Electronically Signed: Robet Leu, PA-C 12/05/2022, 10:44 AM   I spent a total of 20 Minutes  in face to face in clinical consultation, greater than 50% of which was counseling/coordinating care for perc chole drain

## 2022-12-06 ENCOUNTER — Inpatient Hospital Stay (HOSPITAL_COMMUNITY): Payer: Medicare Other

## 2022-12-06 DIAGNOSIS — R7989 Other specified abnormal findings of blood chemistry: Secondary | ICD-10-CM

## 2022-12-06 DIAGNOSIS — R509 Fever, unspecified: Secondary | ICD-10-CM

## 2022-12-06 DIAGNOSIS — R6521 Severe sepsis with septic shock: Secondary | ICD-10-CM | POA: Diagnosis not present

## 2022-12-06 DIAGNOSIS — A419 Sepsis, unspecified organism: Secondary | ICD-10-CM | POA: Diagnosis not present

## 2022-12-06 LAB — GLUCOSE, CAPILLARY
Glucose-Capillary: 134 mg/dL — ABNORMAL HIGH (ref 70–99)
Glucose-Capillary: 138 mg/dL — ABNORMAL HIGH (ref 70–99)
Glucose-Capillary: 167 mg/dL — ABNORMAL HIGH (ref 70–99)
Glucose-Capillary: 176 mg/dL — ABNORMAL HIGH (ref 70–99)
Glucose-Capillary: 171 mg/dL — ABNORMAL HIGH (ref 70–99)

## 2022-12-06 LAB — CBC WITH DIFFERENTIAL/PLATELET
Abs Immature Granulocytes: 0 10*3/uL (ref 0.00–0.07)
Basophils Absolute: 0.1 10*3/uL (ref 0.0–0.1)
Basophils Relative: 1 %
Eosinophils Absolute: 0.1 10*3/uL (ref 0.0–0.5)
Eosinophils Relative: 1 %
HCT: 33.9 % — ABNORMAL LOW (ref 39.0–52.0)
Hemoglobin: 11.7 g/dL — ABNORMAL LOW (ref 13.0–17.0)
Lymphocytes Relative: 9 %
Lymphs Abs: 1.1 10*3/uL (ref 0.7–4.0)
MCH: 32.4 pg (ref 26.0–34.0)
MCHC: 34.5 g/dL (ref 30.0–36.0)
MCV: 93.9 fL (ref 80.0–100.0)
Monocytes Absolute: 0.3 10*3/uL (ref 0.1–1.0)
Monocytes Relative: 2 %
Neutro Abs: 10.9 10*3/uL — ABNORMAL HIGH (ref 1.7–7.7)
Neutrophils Relative %: 87 %
Platelets: 47 10*3/uL — ABNORMAL LOW (ref 150–400)
RBC: 3.61 MIL/uL — ABNORMAL LOW (ref 4.22–5.81)
RDW: 14.7 % (ref 11.5–15.5)
WBC: 12.5 10*3/uL — ABNORMAL HIGH (ref 4.0–10.5)
nRBC: 0 % (ref 0.0–0.2)
nRBC: 0 /100{WBCs}

## 2022-12-06 LAB — COMPREHENSIVE METABOLIC PANEL
ALT: 31 U/L (ref 0–44)
AST: 94 U/L — ABNORMAL HIGH (ref 15–41)
Albumin: 2.6 g/dL — ABNORMAL LOW (ref 3.5–5.0)
Alkaline Phosphatase: 57 U/L (ref 38–126)
Anion gap: 10 (ref 5–15)
BUN: 35 mg/dL — ABNORMAL HIGH (ref 8–23)
CO2: 21 mmol/L — ABNORMAL LOW (ref 22–32)
Calcium: 7.9 mg/dL — ABNORMAL LOW (ref 8.9–10.3)
Chloride: 107 mmol/L (ref 98–111)
Creatinine, Ser: 1.37 mg/dL — ABNORMAL HIGH (ref 0.61–1.24)
GFR, Estimated: 53 mL/min — ABNORMAL LOW (ref >60)
Glucose, Bld: 146 mg/dL — ABNORMAL HIGH (ref 70–99)
Potassium: 4.1 mmol/L (ref 3.5–5.1)
Sodium: 138 mmol/L (ref 135–145)
Total Bilirubin: 1.4 mg/dL — ABNORMAL HIGH (ref 0.3–1.2)
Total Protein: 5.5 g/dL — ABNORMAL LOW (ref 6.5–8.1)

## 2022-12-06 LAB — URINALYSIS, W/ REFLEX TO CULTURE (INFECTION SUSPECTED)
Bacteria, UA: NONE SEEN
Bilirubin Urine: NEGATIVE
Glucose, UA: NEGATIVE mg/dL
Ketones, ur: 5 mg/dL — AB
Leukocytes,Ua: NEGATIVE
Nitrite: NEGATIVE
Protein, ur: 100 mg/dL — AB
Specific Gravity, Urine: 1.02 (ref 1.005–1.030)
pH: 5 (ref 5.0–8.0)

## 2022-12-06 LAB — CK TOTAL AND CKMB (NOT AT ARMC)
CK, MB: 9.9 ng/mL — ABNORMAL HIGH (ref 0.5–5.0)
Total CK: 797 U/L — ABNORMAL HIGH (ref 49–397)

## 2022-12-06 LAB — TROPONIN I (HIGH SENSITIVITY)
Troponin I (High Sensitivity): 573 ng/L (ref <18)
Troponin I (High Sensitivity): 482 ng/L (ref <18)

## 2022-12-06 LAB — ECHOCARDIOGRAM COMPLETE
AR max vel: 1.84 cm2
AV Area VTI: 2 cm2
AV Area mean vel: 2.08 cm2
AV Mean grad: 4.9 mm[Hg]
AV Peak grad: 11.1 mm[Hg]
Ao pk vel: 1.67 m/s
Area-P 1/2: 4.8 cm2
Calc EF: 48.7 %
Height: 75 in
S' Lateral: 3.4 cm
Single Plane A2C EF: 48.7 %
Single Plane A4C EF: 49.3 %
Weight: 3989.44 [oz_av]

## 2022-12-06 LAB — LACTIC ACID, PLASMA
Lactic Acid, Venous: 1.2 mmol/L (ref 0.5–1.9)
Lactic Acid, Venous: 0.9 mmol/L (ref 0.5–1.9)

## 2022-12-06 LAB — PROCALCITONIN: Procalcitonin: 28.17 ng/mL

## 2022-12-06 LAB — AMMONIA: Ammonia: 29 umol/L (ref 9–35)

## 2022-12-06 MED ORDER — LORAZEPAM 2 MG/ML IJ SOLN: 2.0000 mg | Freq: Once | INTRAMUSCULAR | Status: DC

## 2022-12-06 MED ORDER — HALOPERIDOL LACTATE 5 MG/ML IJ SOLN
3.0000 mg | Freq: Once | INTRAMUSCULAR | Status: DC
  Filled 2022-12-06: qty 1

## 2022-12-06 MED ORDER — CHLORDIAZEPOXIDE HCL 25 MG PO CAPS: 25.0000 mg | ORAL_CAPSULE | Freq: Once | ORAL | Status: DC

## 2022-12-06 MED ORDER — LORAZEPAM 2 MG/ML IJ SOLN
1.0000 mg | Freq: Once | INTRAMUSCULAR | Status: AC
  Administered 2022-12-06: 1 mg via INTRAVENOUS
  Filled 2022-12-06: qty 1

## 2022-12-06 MED ORDER — DEXTROSE-SODIUM CHLORIDE 5-0.45 % IV SOLN: INTRAVENOUS | Status: AC

## 2022-12-06 MED ORDER — DOXYCYCLINE HYCLATE 100 MG IV SOLR
100.0000 mg | Freq: Two times a day (BID) | INTRAVENOUS | Status: DC
  Administered 2022-12-06 – 2022-12-07 (×4): 100 mg via INTRAVENOUS
  Filled 2022-12-06 (×6): qty 100

## 2022-12-06 MED ORDER — CHLORDIAZEPOXIDE HCL 25 MG PO CAPS: 50.0000 mg | ORAL_CAPSULE | Freq: Once | ORAL | Status: DC

## 2022-12-06 MED ORDER — QUETIAPINE FUMARATE 50 MG PO TABS
50.0000 mg | ORAL_TABLET | Freq: Every day | ORAL | Status: DC
  Administered 2022-12-06: 50 mg via ORAL
  Filled 2022-12-06: qty 1

## 2022-12-06 MED ORDER — HALOPERIDOL LACTATE 5 MG/ML IJ SOLN: 5.0000 mg | Freq: Once | INTRAMUSCULAR | Status: DC

## 2022-12-06 MED ORDER — HALOPERIDOL LACTATE 5 MG/ML IJ SOLN
2.0000 mg | Freq: Four times a day (QID) | INTRAMUSCULAR | Status: DC | PRN
  Administered 2022-12-06 – 2022-12-08 (×7): 2 mg via INTRAVENOUS
  Filled 2022-12-06 (×7): qty 1

## 2022-12-06 MED ORDER — HALOPERIDOL LACTATE 5 MG/ML IJ SOLN
5.0000 mg | Freq: Once | INTRAMUSCULAR | Status: AC
  Administered 2022-12-06: 5 mg via INTRAVENOUS
  Filled 2022-12-06: qty 1

## 2022-12-06 MED ORDER — HALOPERIDOL LACTATE 5 MG/ML IJ SOLN
5.0000 mg | Freq: Once | INTRAMUSCULAR | Status: AC
  Administered 2022-12-07: 5 mg via INTRAVENOUS

## 2022-12-06 NOTE — Progress Notes (Signed)
PROGRESS NOTE    Patrick Kidd  ZOX:[id number] DOB: May 23, 1945 DOA: 12/03/2022 PCP: Etta Grandchild, MD   Chief Complaint  Patient presents with   Fever    Fever chills and diaphoresis onset around 1600 today. Pt was seen at [facility] yesterday. Hx of cirrhosis of the liver and gallstones. Abd distention noted. Low grade fever pta 100.4. EMS reports pt HR 150 pta and sating 83 percent on RA. Pt given bolus and placed on 3L [place]. Pt systolic 90s to 100s and tachypneic in the 30s pta.    Brief Narrative:   Patrick Kidd is a 77 y.o. male who has a PMH as below including but not limited to EtOH cirrhosis well controlled and no longer drinking, PUD requiring cautery in the past, DM, cholecystitis. He had admission back in April 2024 for acute calculous cholecystitis and E.coli bacteremia. He was deemed to be at increased risk for surgery; therefore, he was treated conservatively with abx with outpatient GI follow up. He did well following that admission with no issues.   On 10/29, he began to have RUQ pain that prompted an ED visit to [facility]. He felt improved with nausea meds, pain meds, GI cocktail and refused imaging at the time and asked to be discharged home.   On 10/30, he was doing well besides some pain. He called Dr. Chiquita Loth with GI for an Rx for pain pills that could get him through until his follow up appointment which was scheduled for 10/31. Around 1600 he began to have chills. These got worse so he came back to ED.   In ED, he was hypotensive and shocky. He received 3L NS with some improvement in BP during 3rd liter. He also had AKI, lactic acidosis, leukopenia, thrombocytopenia. CT A/P demonstrated acute cholecystitis. Given his constellation of lab abnormalities and concern that his pressure would deteriorate further, patient was admitted to ICU, his blood pressure has improved, general surgery has been consulted, recommendation for percutaneous cholecystostomy drain insertion for  which IR has been consulted, unfortunately percutaneous cholecystostomy drain by IR unsuccessful due to medication noncompliance..  Assessment & Plan:   Principal Problem:   Septic shock (HCC)    Septic shock due to acute calculus cholecystitis with E. coli and Klebsiella bacteremia  -General Surgery input greatly appreciated, patient is not an operative candidate, recommendation for percutaneous [place]. -Unsuccessful attempt by IR for percutaneous drain insertion yesterday due to patient noncompliance, I have discussed with both surgery, and IR, patient will need procedure done, it will be likely under anesthesia, likely will happen on Monday as discussed with IR, which should be appropriate whereby then he will be medically optimized.  It can be done sooner-emergent if he is in shock. . -Continue with IV Rocephin 2 g daily for bacteremia and cholecystitis -No further requirement of vasopressors, weaned off morning 10-31  Acute metabolic encephalopathy  Hospital delirium  -Patient is high risk of delirium given his liver disease, sepsis  -Difficult agitation and restlessness, none cooperative with procedure yesterday  -Wife denies any alcohol abuse for few years  -Continue with as needed Haldol for agitation, will add Seroquel at bedtime  -Continue with IV fluids  acute kidney injury:  -Suspect ATN in setting of sepsis, hypovolemia possible now adequate fluid resuscitated.  No hydronephrosis on CT abdomen pelvis. -Continue with IV fluids and avoid nephrotoxic medications   Diabetes with hyperglycemia:  - on 50 units long acting at home -- SSI --carb modified once taking PO  Thrombocytopenia:  -In setting of CLL, and liver cirrhosis worsened with consumption of sepsis -- SCDs, hold chemoprophylaxis until it is greater than 50 -- will transfuse if invasive procedures (perc chole tube) are recommended   Cirrhosis: Child Pugh A (6 points) --well compensated, feel would tolerate  procedures  Elevated troponins -this is most likely in the setting of the septic shock, it is trending down, will obtain 2D echo      DVT prophylaxis: SCD Code Status: Full Family Communication: D/W wife at bedside Disposition:   Status is: Inpatient    Consultants:  PCCM General surgery Interventional radiology  Subjective:  Patient is altered, cannot provide any reliable complaints, he was restless, agitated overnight as discussed with staff  Objective: Vitals:   12/06/22 0600 12/06/22 0700 12/06/22 0800 12/06/22 1056  BP:   128/70   Pulse: 97 (!) 110 (!) 105   Resp:   20   Temp:   99 F (37.2 C)   TempSrc:   Axillary   SpO2: 98% 98% 93%   Weight:    113.1 kg  Height:    6\' 3"  (1.905 m)   No intake or output data in the 24 hours ending 12/06/22 1139 Filed Weights   12/03/22 2320 12/04/22 0356 12/06/22 1056  Weight: 113.1 kg 113.1 kg 113.1 kg    Examination:  Patient appears calm, comfortable, Symmetrical Chest wall movement, Good air movement bilaterally Tachycardic,No Gallops,Rubs or new Murmurs, No Parasternal Heave +ve B.Sounds, Abd Soft, No tenderness, No rebound - guarding or rigidity. No Cyanosis, Clubbing or edema, No new Rash or bruise     Data Reviewed: I have personally reviewed following labs and imaging studies  CBC: Recent Labs  Lab 12/02/22 0446 12/03/22 1850 12/04/22 0031 12/05/22 1301 12/06/22 0443  WBC 7.6 2.9* 9.5 14.7* 12.5*  NEUTROABS 4.3 1.9  --   --  10.9*  HGB 15.6 14.7 12.6* 12.9* 11.7*  HCT 44.6 41.8 36.4* 37.4* 33.9*  MCV 94.5 95.9 94.3 96.6 93.9  PLT 84* 58* 41* 42* 47*    Basic Metabolic Panel: Recent Labs  Lab 12/02/22 0446 12/03/22 1850 12/04/22 0031 12/05/22 1124 12/06/22 0443  NA 139 136 134* 134* 138  K 4.4 3.9 3.6 4.5 4.1  CL 105 103 102 104 107  CO2 22 14* 19* 16* 21*  GLUCOSE 272* 192* 153* 107* 146*  BUN 18 27* 31* 46* 35*  CREATININE 0.99 1.88* 1.90* 1.71* 1.37*  CALCIUM 9.2 8.1* 7.8* 7.7*  7.9*  MG  --   --  1.6* 2.4  --   PHOS  --   --  2.1* 3.0  --     GFR: Estimated Creatinine Clearance: 60.3 mL/min (A) (by C-G formula based on SCr of 1.37 mg/dL (H)).  Liver Function Tests: Recent Labs  Lab 12/02/22 0446 12/03/22 1850 12/05/22 1124 12/06/22 0443  AST 24 36 103* 94*  ALT 14 22 28 31   ALKPHOS 76 208* 57 57  BILITOT 1.1 2.7* 1.3* 1.4*  PROT 7.4 5.5* 5.4* 5.5*  ALBUMIN 4.2 3.0* 2.7* 2.6*    CBG: Recent Labs  Lab 12/05/22 1505 12/05/22 1956 12/05/22 2334 12/06/22 0405 12/06/22 0903  GLUCAP 104* 131* 163* 134* 138*     Recent Results (from the past 240 hour(s))  Resp panel by RT-PCR (RSV, Flu A&B, Covid) Anterior Nasal Swab     Status: None   Collection Time: 12/03/22  6:55 PM   Specimen: Anterior Nasal Swab  Result Value Ref Range Status  SARS Coronavirus 2 by RT PCR NEGATIVE NEGATIVE Final   Influenza A by PCR NEGATIVE NEGATIVE Final   Influenza B by PCR NEGATIVE NEGATIVE Final    Comment: (NOTE) The Xpert Xpress SARS-CoV-2/FLU/RSV plus assay is intended as an aid in the diagnosis of influenza from Nasopharyngeal swab specimens and should not be used as a sole basis for treatment. Nasal washings and aspirates are unacceptable for Xpert Xpress SARS-CoV-2/FLU/RSV testing.  Fact Sheet for Patients: BloggerCourse.com  Fact Sheet for Healthcare Providers: SeriousBroker.it  This test is not yet approved or cleared by the [place] FDA and has been authorized for detection and/or diagnosis of SARS-CoV-2 by FDA under an Emergency Use Authorization (EUA). This EUA will remain in effect (meaning this test can be used) for the duration of the COVID-19 declaration under Section 564(b)(1) of the Act, 21 U.S.C. section 360bbb-3(b)(1), unless the authorization is terminated or revoked.     Resp Syncytial Virus by PCR NEGATIVE NEGATIVE Final    Comment: (NOTE) Fact Sheet for  Patients: BloggerCourse.com  Fact Sheet for Healthcare Providers: SeriousBroker.it  This test is not yet approved or cleared by the [place] FDA and has been authorized for detection and/or diagnosis of SARS-CoV-2 by FDA under an Emergency Use Authorization (EUA). This EUA will remain in effect (meaning this test can be used) for the duration of the COVID-19 declaration under Section 564(b)(1) of the Act, 21 U.S.C. section 360bbb-3(b)(1), unless the authorization is terminated or revoked.  Performed at [facility], [address], [place], [place] [address]   Blood Culture (routine x 2)     Status: None (Preliminary result)   Collection Time: 12/03/22  6:59 PM   Specimen: BLOOD  Result Value Ref Range Status   Specimen Description BLOOD RIGHT ANTECUBITAL  Final   Special Requests   Final    BOTTLES DRAWN AEROBIC AND ANAEROBIC Blood Culture adequate volume   Culture  Setup Time   Final    GRAM NEGATIVE RODS IN BOTH AEROBIC AND ANAEROBIC BOTTLES CRITICAL VALUE NOTED.  VALUE IS CONSISTENT WITH PREVIOUSLY REPORTED AND CALLED VALUE.    Culture   Final    GRAM NEGATIVE RODS CULTURE REINCUBATED FOR BETTER GROWTH Performed at [facility], [address], [place], [place] [address]    Report Status PENDING  Incomplete  Blood Culture (routine x 2)     Status: Abnormal (Preliminary result)   Collection Time: 12/03/22  7:08 PM   Specimen: BLOOD RIGHT FOREARM  Result Value Ref Range Status   Specimen Description BLOOD RIGHT FOREARM  Final   Special Requests   Final    BOTTLES DRAWN AEROBIC AND ANAEROBIC Blood Culture results may not be optimal due to an excessive volume of blood received in culture bottles   Culture  Setup Time   Final    GRAM NEGATIVE RODS IN BOTH AEROBIC AND ANAEROBIC BOTTLES CRITICAL RESULT CALLED TO, READ BACK BY AND VERIFIED WITH: PHARMD J LEDFORD 12/04/22 @ 0714 BY AB    Culture (A)  Final     KLEBSIELLA OXYTOCA CULTURE REINCUBATED FOR BETTER GROWTH Performed at [facility], [address] [place], [place] [address]    Report Status PENDING  Incomplete  Blood Culture ID Panel (Reflexed)     Status: Abnormal   Collection Time: 12/03/22  7:08 PM  Result Value Ref Range Status   Enterococcus faecalis NOT DETECTED NOT DETECTED Final   Enterococcus Faecium NOT DETECTED NOT DETECTED Final  Listeria monocytogenes NOT DETECTED NOT DETECTED Final   Staphylococcus species NOT DETECTED NOT DETECTED Final   Staphylococcus aureus (BCID) NOT DETECTED NOT DETECTED Final   Staphylococcus epidermidis NOT DETECTED NOT DETECTED Final   Staphylococcus lugdunensis NOT DETECTED NOT DETECTED Final   Streptococcus species NOT DETECTED NOT DETECTED Final   Streptococcus agalactiae NOT DETECTED NOT DETECTED Final   Streptococcus pneumoniae NOT DETECTED NOT DETECTED Final   Streptococcus pyogenes NOT DETECTED NOT DETECTED Final   A.calcoaceticus-baumannii NOT DETECTED NOT DETECTED Final   Bacteroides fragilis NOT DETECTED NOT DETECTED Final   Enterobacterales DETECTED (A) NOT DETECTED Final    Comment: CRITICAL RESULT CALLED TO, READ BACK BY AND VERIFIED WITH: PHARMD J LEDFORD 12/04/22 @ 0714 BY AB    Enterobacter cloacae complex NOT DETECTED NOT DETECTED Final   Escherichia coli DETECTED (A) NOT DETECTED Final    Comment: CRITICAL RESULT CALLED TO, READ BACK BY AND VERIFIED WITH: PHARMD J LEDFORD 12/04/22 @ 0714 BY AB    Klebsiella aerogenes NOT DETECTED NOT DETECTED Final   Klebsiella oxytoca DETECTED (A) NOT DETECTED Final    Comment: CRITICAL RESULT CALLED TO, READ BACK BY AND VERIFIED WITH: PHARMD J LEDFORD 12/04/22 @ 0714 BY AB    Klebsiella pneumoniae NOT DETECTED NOT DETECTED Final   Proteus species NOT DETECTED NOT DETECTED Final   Salmonella species NOT DETECTED NOT DETECTED Final   Serratia marcescens NOT DETECTED NOT DETECTED Final   Haemophilus influenzae NOT DETECTED  NOT DETECTED Final   Neisseria meningitidis NOT DETECTED NOT DETECTED Final   Pseudomonas aeruginosa NOT DETECTED NOT DETECTED Final   Stenotrophomonas maltophilia NOT DETECTED NOT DETECTED Final   Candida albicans NOT DETECTED NOT DETECTED Final   Candida auris NOT DETECTED NOT DETECTED Final   Candida glabrata NOT DETECTED NOT DETECTED Final   Candida krusei NOT DETECTED NOT DETECTED Final   Candida parapsilosis NOT DETECTED NOT DETECTED Final   Candida tropicalis NOT DETECTED NOT DETECTED Final   Cryptococcus neoformans/gattii NOT DETECTED NOT DETECTED Final   CTX-M ESBL NOT DETECTED NOT DETECTED Final   Carbapenem resistance IMP NOT DETECTED NOT DETECTED Final   Carbapenem resistance KPC NOT DETECTED NOT DETECTED Final   Carbapenem resistance NDM NOT DETECTED NOT DETECTED Final   Carbapenem resist OXA 48 LIKE NOT DETECTED NOT DETECTED Final   Carbapenem resistance VIM NOT DETECTED NOT DETECTED Final    Comment: Performed at [facility], [address], [place], [place] [address]  MRSA Next Gen by PCR, Nasal     Status: None   Collection Time: 12/03/22 11:14 PM   Specimen: Nasal Mucosa; Nasal Swab  Result Value Ref Range Status   MRSA by PCR Next Gen NOT DETECTED NOT DETECTED Final    Comment: (NOTE) The GeneXpert MRSA Assay (FDA approved for NASAL specimens only), is one component of a comprehensive MRSA colonization surveillance program. It is not intended to diagnose MRSA infection nor to guide or monitor treatment for MRSA infections. Test performance is not FDA approved in patients less than 30 years old. Performed at [facility], [address] [place], [place] [address]          Radiology Studies: DG CHEST PORT 1 VIEW  Result Date: 12/06/2022 CLINICAL DATA:  Dyspnea, cough EXAM: PORTABLE CHEST 1 VIEW COMPARISON:  12/04/2022 FINDINGS: Lung volumes are small. Developing right basilar opacity may represent increasing atelectasis or developing focal  pulmonary infiltrate. No pneumothorax or pleural effusion. Cardiac size within normal limits.  Pulmonary vascular crowding at the hila without overt pulmonary edema. No acute bone abnormality. IMPRESSION: 1. Pulmonary hypoinflation. 2. Developing right basilar opacity, atelectasis versus developing focal pulmonary infiltrate. Electronically Signed   By: Helyn Numbers M.D.   On: 12/06/2022 03:17   DG Abd 1 View  Result Date: 12/06/2022 CLINICAL DATA:  Cough, abdominal pain EXAM: ABDOMEN - 1 VIEW COMPARISON:  05/30/2022 FINDINGS: Right flank is excluded from view. Visualized abdominal gas pattern is nonobstructive. No gross free intraperitoneal gas. Underpenetration precludes evaluation of the visceral shadows. Osseous structures are age-appropriate. IMPRESSION: 1. Nonobstructive abdominal gas pattern. Electronically Signed   By: Helyn Numbers M.D.   On: 12/06/2022 03:15   IR Perc Cholecystostomy  Result Date: 12/05/2022 INDICATION: 77 year old male with acute cholecystitis EXAM: IMAGE GUIDED PERCUTANEOUS CHOLECYSTOSTOMY DISCONTINUATION OF PERCUTANEOUS CHOLECYSTOSTOMY MEDICATIONS: None ANESTHESIA/SEDATION: Moderate (conscious) sedation was not employed during this procedure. A total of Versed 1.0 mg and Fentanyl 0 mcg was administered intravenously. Moderate Sedation Time: 0 minutes. The patient's level of consciousness and vital signs were monitored continuously by radiology nursing throughout the procedure under my direct supervision. FLUOROSCOPY TIME:  None COMPLICATIONS: None PROCEDURE: Informed written consent was obtained from the patient after a thorough discussion of the procedural risks, benefits and alternatives. All questions were addressed. Maximal Sterile Barrier Technique was utilized including caps, mask, sterile gowns, sterile gloves, sterile drape, hand hygiene and skin antiseptic. A timeout was performed prior to the initiation of the procedure. The patient was laid flat on the table under  the image intensifier. Before prep and drape could occur, the patient began complaining of crossed a phobia and difficulty breathing. 1 mg of anxiolytic was administered, which did not help him. The patient sat up and I had a long conversation with the patient about the necessity of the procedure. He refused to continue, essentially withdrawing his consent and we were unable to proceed. He would not continue with the procedure. We had no choice but to discontinue. IMPRESSION: Discontinued attempt at percutaneous cholecystostomy as above Electronically Signed   By: Gilmer Mor D.O.   On: 12/05/2022 17:16   NM Hepatobiliary Liver Func  Result Date: 12/05/2022 CLINICAL DATA:  Abdominal pain.  Cholelithiasis. EXAM: NUCLEAR MEDICINE HEPATOBILIARY IMAGING TECHNIQUE: Sequential images of the abdomen were obtained out to 60 minutes following intravenous administration of radiopharmaceutical. RADIOPHARMACEUTICALS:  5.0 mCi Tc-59m  Choletec IV COMPARISON:  CT 12/04/2022 FINDINGS: Patient image for 1 hour. Counts are read bili clear from the blood pool in thickened of the liver. Counts are evident in the small bowel by 20 minutes. The gallbladder fails to fill at 60 minutes. Patient was agitated and could not proceed further with imaging. IMPRESSION: Non filling of the gallbladder at 60 minutes could indicate cystic duct obstruction. Patient could not tolerate further imaging. Equivocal exam Electronically Signed   By: Genevive Bi M.D.   On: 12/05/2022 14:07   US Abdomen Limited RUQ (LIVER/GB)  Result Date: 12/04/2022 CLINICAL DATA:  Cholecystitis EXAM: ULTRASOUND ABDOMEN LIMITED RIGHT UPPER QUADRANT COMPARISON:  Ultrasound 05/30/2022 and CT scan 12/03/2022. FINDINGS: Gallbladder: Distended gallbladder. Slight wall thickening but the patient has a known chronic liver disease. Previous areas of stone is not well appreciated today. This could be technical. Common bile duct: Diameter: 3 mm Liver: Heterogeneous  liver consistent with known chronic liver disease. There is a anechoic rounded structure with through transmission in the liver measuring 6.5 cm consistent with a cyst. Portal vein is patent on color Doppler imaging with  normal direction of blood flow towards the liver. Other: None. IMPRESSION: Nodular echogenic liver consistent with known chronic liver disease. Hepatic cysts. Please correlate with the prior CT scan. Distended gallbladder with wall thickening but nonspecific in the presence of chronic liver disease. There is some sludge. The stone seen previously are not as well seen on this ultrasound but are likely present based on prior imaging. If there is further concern of acute cholecystitis a HIDA scan could be considered as clinically appropriate for further delineation. Electronically Signed   By: Karen Kays M.D.   On: 12/04/2022 14:01        Scheduled Meds:  Chlorhexidine Gluconate Cloth  6 each Topical Daily   folic acid  1 mg Oral Daily   insulin aspart  0-15 Units Subcutaneous Q4H   multivitamin with minerals  1 tablet Oral Daily   pantoprazole (PROTONIX) IV  40 mg Intravenous Q12H   QUEtiapine  50 mg Oral QHS   thiamine  100 mg Oral Daily   Or   thiamine  100 mg Intravenous Daily   Continuous Infusions:  cefTRIAXone (ROCEPHIN)  IV 2 g (12/06/22 0944)   dextrose 5 % and 0.45 % NaCl 75 mL/hr at 12/06/22 0931   doxycycline (VIBRAMYCIN) IV 100 mg (12/06/22 1133)     LOS: 3 days      Huey Bienenstock, MD [facility]   To contact the attending provider between 7A-7P or the covering provider during after hours 7P-7A, please log into the web site [email or web address] and access using universal  password for that web site. If you do not have the password, please call the hospital operator.  12/06/2022, 11:39 AM

## 2022-12-06 NOTE — Evaluation (Addendum)
Occupational Therapy Evaluation Patient Details Name: Patrick Kidd MRN: [id number] DOB: 29-Apr-1945 Today's Date: 12/06/2022   History of Present Illness 77 y.o. male presents to [facility] after his wife brought him back due to worsening chills and RUQ pain to which he reports was improving.  PMH includes alcoholic cirrhosis, CLL, diabetes, HTN, peripheral neuropathy, Wolff-Parkinson-White syndrome, prostate CA.   Clinical Impression   Pt admitted for above, presents with impaired cognition but reportedly following simple commands much better and more redirectable today. Due to his slower processing and decreased initiation he is not as thorough with ADLs and needs Max to CGA, completing pivot transfers with CGA + RW as well. Pt spouse hopeful for home instead of rehab, discussed that is plausible pending progression. If pt were to progress with cognition and function while in acute setting anticipate that he will not need follow-up OT. Patient has the potential to reach Mod I and demos the ability to tolerate 3 hours of therapy. Pt would benefit from an intensive rehab program to help maximize functional independence.     If plan is discharge home, recommend the following: A little help with walking and/or transfers;A little help with bathing/dressing/bathroom;Assistance with cooking/housework    Functional Status Assessment  Patient has had a recent decline in their functional status and demonstrates the ability to make significant improvements in function in a reasonable and predictable amount of time.  Equipment Recommendations  None recommended by OT    Recommendations for Other Services Rehab consult (Rec following pt for now to see if he progresses while in acute stay)     Precautions / Restrictions Precautions Precaution Comments: CiWA Restrictions Weight Bearing Restrictions: No      Mobility Bed Mobility Overal bed mobility: Needs Assistance Bed Mobility: Supine to  Sit, Sit to Supine     Supine to sit: Mod assist, HOB elevated, Used rails Sit to supine: Min assist   General bed mobility comments: Mod A for BLE mobility and to assist with trunk control into upright sitting, Upon return to supine pt needing assist with getting LLE back in bed    Transfers Overall transfer level: Needs assistance Equipment used: Rolling walker (2 wheels) Transfers: Sit to/from Stand, Bed to chair/wheelchair/BSC Sit to Stand: Max assist           General transfer comment: STS from EOB light min A initially, CGA from BSC, then CGA from elevated EOB. Cues needed for hand placement      Balance Overall balance assessment: Needs assistance Sitting-balance support: No upper extremity supported, Feet supported Sitting balance-Leahy Scale: Fair Sitting balance - Comments: sitting EOB   Standing balance support: During functional activity Standing balance-Leahy Scale: Poor                             ADL either performed or assessed with clinical judgement   ADL Overall ADL's : Needs assistance/impaired Eating/Feeding: Sitting;Supervision/ safety;Set up   Grooming: Sitting;Contact guard assist   Upper Body Bathing: Sitting;Contact guard assist   Lower Body Bathing: Sitting/lateral leans;Minimal assistance Lower Body Bathing Details (indicate cue type and reason): pt with decreased initiation, initially washed his gown instead of his leg Upper Body Dressing : Sitting;Minimal assistance Upper Body Dressing Details (indicate cue type and reason): will follow commands for assist with donning gown Lower Body Dressing: Sitting/lateral leans;Maximal assistance   Toilet Transfer: Minimal assistance;Rolling walker (2 wheels);Stand-pivot;BSC/3in1   Toileting- Architect and Hygiene: Moderate  assistance;Sit to/from stand       Functional mobility during ADLs: Minimal assistance;Rolling walker (2 wheels) (pivoted to/from) General ADL  Comments: Pt spouse present and encouraging mobility     Vision         Perception         Praxis         Pertinent Vitals/Pain Pain Assessment Pain Assessment: Faces Faces Pain Scale: No hurt     Extremity/Trunk Assessment Upper Extremity Assessment Upper Extremity Assessment: Overall WFL for tasks assessed   Lower Extremity Assessment Lower Extremity Assessment: Defer to PT evaluation   Cervical / Trunk Assessment Cervical / Trunk Assessment: Normal   Communication Communication Communication: No apparent difficulties Cueing Techniques: Verbal cues;Gestural cues   Cognition Arousal: Lethargic Behavior During Therapy: WFL for tasks assessed/performed Overall Cognitive Status: Impaired/Different from baseline Area of Impairment: Orientation, Attention, Memory, Following commands, Safety/judgement, Problem solving, Awareness                 Orientation Level: Disoriented to, Time, Situation Current Attention Level: Focused Memory: Decreased short-term memory, Decreased recall of precautions Following Commands: Follows one step commands with increased time Safety/Judgement: Decreased awareness of deficits Awareness: Intellectual Problem Solving: Decreased initiation, Difficulty sequencing, Slow processing, Requires verbal cues, Requires tactile cues General Comments: Pt eyes closed on/off throughout session     General Comments  VSS On RA, discussed with spouse rehab options    Exercises     Shoulder Instructions      Home Living Family/patient expects to be discharged to:: Private residence Living Arrangements: Spouse/significant other Available Help at Discharge: Family;Available 24 hours/day Type of Home: House Home Access: Stairs to enter [employer] of Steps: 1 Entrance Stairs-Rails: None Home Layout: One level     Bathroom Shower/Tub: Producer, television/film/video: Standard     Home Equipment: Cane - single Social worker (2 wheels);Wheelchair - manual   Additional Comments: 1 fall in the last year      Prior Functioning/Environment Prior Level of Function : Independent/Modified Independent;Working/employed;Driving;History of Falls (last six months)             Mobility Comments: 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 associate, had recently progressed to ambulating without a device since back surgery ADLs Comments: ind        OT Problem List: Decreased cognition;Decreased safety awareness;Impaired balance (sitting and/or standing)      OT Treatment/Interventions: Self-care/ADL training;Balance training;Therapeutic exercise;Therapeutic activities;Patient/family education    OT Goals(Current goals can be found in the care plan section) Acute Rehab OT Goals Patient Stated Goal: To go home OT Goal Formulation: With family Time For Goal Achievement: 12/20/22 Potential to Achieve Goals: Good ADL Goals Pt Will Perform Grooming: Independently;standing Pt Will Perform Lower Body Bathing: Independently;sit to/from stand Pt Will Perform Upper Body Dressing: Independently;standing Pt Will Perform Lower Body Dressing: Independently;sit to/from stand Pt Will Transfer to Toilet: Independently;ambulating Additional ADL Goal #1: Pt will complete a 3 step household ADL in proper sequence with Supervision  OT Frequency: Min 1X/week    Co-evaluation              AM-PAC OT "6 Clicks" Daily Activity     Outcome Measure Help from another person eating meals?: A Little Help from another person taking care of personal grooming?: A Little Help from another person toileting, which includes using toliet, bedpan, or urinal?: A Little Help from another person bathing (including washing, rinsing, drying)?: A Little Help from another person to put  on and taking off regular upper body clothing?: A Little Help from another person to put on and taking off regular lower body clothing?: A Lot 6 Click Score: 17   End of  Session Equipment Utilized During Treatment: Gait belt;Rolling walker (2 wheels) Nurse Communication: Mobility status  Activity Tolerance: Patient tolerated treatment well Patient left: in bed;with call bell/phone within reach;with bed alarm set;with family/visitor present  OT Visit Diagnosis: Unsteadiness on feet (R26.81);Other abnormalities of gait and mobility (R26.89);Other symptoms and signs involving cognitive function                Time: 1531-1600 OT Time Calculation (min): 29 min Charges:  OT General Charges $OT Visit: 1 Visit OT Evaluation $OT Eval Moderate Complexity: 1 Mod OT Treatments $Therapeutic Activity: 8-22 mins  12/06/2022  AB, OTR/L  Acute Rehabilitation Services  Office: [phone]   Tristan Schroeder 12/06/2022, 5:11 PM

## 2022-12-06 NOTE — Plan of Care (Signed)
Pt initially stable and AxO x2-3. Increasingly became agitated. Pt placed on CIWA precautions and placed in wrist restraints. Soft restraints protocol followed. Family at bedside and updated via bedside nurse.    Problem: Education: Goal: Ability to describe self-care measures that may prevent or decrease complications (Diabetes Survival Skills Education) will improve Outcome: Progressing Goal: Individualized Educational Video(s) Outcome: Progressing   Problem: Coping: Goal: Ability to adjust to condition or change in health will improve Outcome: Progressing   Problem: Fluid Volume: Goal: Ability to maintain a balanced intake and output will improve Outcome: Progressing   Problem: Health Behavior/Discharge Planning: Goal: Ability to identify and utilize available resources and services will improve Outcome: Progressing Goal: Ability to manage health-related needs will improve Outcome: Progressing   Problem: Metabolic: Goal: Ability to maintain appropriate glucose levels will improve Outcome: Progressing   Problem: Nutritional: Goal: Maintenance of adequate nutrition will improve Outcome: Progressing Goal: Progress toward achieving an optimal weight will improve Outcome: Progressing   Problem: Skin Integrity: Goal: Risk for impaired skin integrity will decrease Outcome: Progressing   Problem: Tissue Perfusion: Goal: Adequacy of tissue perfusion will improve Outcome: Progressing   Problem: Education: Goal: Knowledge of General Education information will improve Description: Including pain rating scale, medication(s)/side effects and non-pharmacologic comfort measures Outcome: Progressing   Problem: Health Behavior/Discharge Planning: Goal: Ability to manage health-related needs will improve Outcome: Progressing   Problem: Clinical Measurements: Goal: Ability to maintain clinical measurements within normal limits will improve Outcome: Progressing Goal: Will remain  free from infection Outcome: Progressing Goal: Diagnostic test results will improve Outcome: Progressing Goal: Respiratory complications will improve Outcome: Progressing Goal: Cardiovascular complication will be avoided Outcome: Progressing   Problem: Activity: Goal: Risk for activity intolerance will decrease Outcome: Progressing   Problem: Nutrition: Goal: Adequate nutrition will be maintained Outcome: Progressing   Problem: Coping: Goal: Level of anxiety will decrease Outcome: Progressing   Problem: Elimination: Goal: Will not experience complications related to bowel motility Outcome: Progressing Goal: Will not experience complications related to urinary retention Outcome: Progressing   Problem: Pain Management: Goal: General experience of comfort will improve Outcome: Progressing   Problem: Safety: Goal: Ability to remain free from injury will improve Outcome: Progressing   Problem: Skin Integrity: Goal: Risk for impaired skin integrity will decrease Outcome: Progressing   Problem: Safety: Goal: Non-violent Restraint(s) Outcome: Progressing

## 2022-12-06 NOTE — Progress Notes (Signed)
  Echocardiogram 2D Echocardiogram has been performed.  Patrick Kidd 12/06/2022, 3:09 PM

## 2022-12-06 NOTE — Progress Notes (Signed)
Subjective: Became disoriented during procedure and unable to undergo cholecystostomy tube insertion. Agitated overnight, somnolent now  ROS: See above, otherwise other systems negative  Objective: Vital signs in last 24 hours: Temp:  [96.4 F (35.8 C)-99.5 F (37.5 C)] 99 F (37.2 C) (11/02 0800) Pulse Rate:  [84-136] 105 (11/02 0800) Resp:  [15-31] 20 (11/02 0800) BP: (115-173)/(59-106) 128/70 (11/02 0800) SpO2:  [79 %-100 %] 93 % (11/02 0800) Last BM Date : 12/04/22  Intake/Output from previous day: 11/01 0701 - 11/02 0700 In: 100 [IV Piggyback:100] Out: -  Intake/Output this shift: No intake/output data recorded.  PE: Gen: calm, trying to communicate with family Resp: nonlabored CV: tachycardic Abd: soft, nondistended  Lab Results:  Recent Labs    12/05/22 1301 12/06/22 0443  WBC 14.7* 12.5*  HGB 12.9* 11.7*  HCT 37.4* 33.9*  PLT 42* 47*   BMET Recent Labs    12/05/22 1124 12/06/22 0443  NA 134* 138  K 4.5 4.1  CL 104 107  CO2 16* 21*  GLUCOSE 107* 146*  BUN 46* 35*  CREATININE 1.71* 1.37*  CALCIUM 7.7* 7.9*   PT/INR Recent Labs    12/03/22 1850  LABPROT 18.0*  INR 1.5*   CMP     Component Value Date/Time   NA 138 12/06/2022 0443   K 4.1 12/06/2022 0443   CL 107 12/06/2022 0443   CO2 21 (L) 12/06/2022 0443   GLUCOSE 146 (H) 12/06/2022 0443   BUN 35 (H) 12/06/2022 0443   CREATININE 1.37 (H) 12/06/2022 0443   CREATININE 0.95 11/19/2022 1052   CREATININE 1.13 06/04/2010 1354   CALCIUM 7.9 (L) 12/06/2022 0443   PROT 5.5 (L) 12/06/2022 0443   ALBUMIN 2.6 (L) 12/06/2022 0443   AST 94 (H) 12/06/2022 0443   AST 21 11/19/2022 1052   ALT 31 12/06/2022 0443   ALT 15 11/19/2022 1052   ALKPHOS 57 12/06/2022 0443   BILITOT 1.4 (H) 12/06/2022 0443   BILITOT 1.6 (H) 11/19/2022 1052   GFRNONAA 53 (L) 12/06/2022 0443   GFRNONAA >60 11/19/2022 1052   GFRAA >60 02/06/2019 0546   Lipase     Component Value Date/Time   LIPASE 41  12/02/2022 0446    Studies/Results: DG CHEST PORT 1 VIEW  Result Date: 12/06/2022 CLINICAL DATA:  Dyspnea, cough EXAM: PORTABLE CHEST 1 VIEW COMPARISON:  12/04/2022 FINDINGS: Lung volumes are small. Developing right basilar opacity may represent increasing atelectasis or developing focal pulmonary infiltrate. No pneumothorax or pleural effusion. Cardiac size within normal limits. Pulmonary vascular crowding at the hila without overt pulmonary edema. No acute bone abnormality. IMPRESSION: 1. Pulmonary hypoinflation. 2. Developing right basilar opacity, atelectasis versus developing focal pulmonary infiltrate. Electronically Signed   By: Helyn Numbers M.D.   On: 12/06/2022 03:17   DG Abd 1 View  Result Date: 12/06/2022 CLINICAL DATA:  Cough, abdominal pain EXAM: ABDOMEN - 1 VIEW COMPARISON:  05/30/2022 FINDINGS: Right flank is excluded from view. Visualized abdominal gas pattern is nonobstructive. No gross free intraperitoneal gas. Underpenetration precludes evaluation of the visceral shadows. Osseous structures are age-appropriate. IMPRESSION: 1. Nonobstructive abdominal gas pattern. Electronically Signed   By: Helyn Numbers M.D.   On: 12/06/2022 03:15   IR Perc Cholecystostomy  Result Date: 12/05/2022 INDICATION: 77 year old male with acute cholecystitis EXAM: IMAGE GUIDED PERCUTANEOUS CHOLECYSTOSTOMY DISCONTINUATION OF PERCUTANEOUS CHOLECYSTOSTOMY MEDICATIONS: None ANESTHESIA/SEDATION: Moderate (conscious) sedation was not employed during this procedure. A total of Versed 1.0 mg and Fentanyl 0 mcg was  administered intravenously. Moderate Sedation Time: 0 minutes. The patient's level of consciousness and vital signs were monitored continuously by radiology nursing throughout the procedure under my direct supervision. FLUOROSCOPY TIME:  None COMPLICATIONS: None PROCEDURE: Informed written consent was obtained from the patient after a thorough discussion of the procedural risks, benefits and  alternatives. All questions were addressed. Maximal Sterile Barrier Technique was utilized including caps, mask, sterile gowns, sterile gloves, sterile drape, hand hygiene and skin antiseptic. A timeout was performed prior to the initiation of the procedure. The patient was laid flat on the table under the image intensifier. Before prep and drape could occur, the patient began complaining of crossed a phobia and difficulty breathing. 1 mg of anxiolytic was administered, which did not help him. The patient sat up and I had a long conversation with the patient about the necessity of the procedure. He refused to continue, essentially withdrawing his consent and we were unable to proceed. He would not continue with the procedure. We had no choice but to discontinue. IMPRESSION: Discontinued attempt at percutaneous cholecystostomy as above Electronically Signed   By: Gilmer Mor D.O.   On: 12/05/2022 17:16   NM Hepatobiliary Liver Func  Result Date: 12/05/2022 CLINICAL DATA:  Abdominal pain.  Cholelithiasis. EXAM: NUCLEAR MEDICINE HEPATOBILIARY IMAGING TECHNIQUE: Sequential images of the abdomen were obtained out to 60 minutes following intravenous administration of radiopharmaceutical. RADIOPHARMACEUTICALS:  5.0 mCi Tc-7m  Choletec IV COMPARISON:  CT 12/04/2022 FINDINGS: Patient image for 1 hour. Counts are read bili clear from the blood pool in thickened of the liver. Counts are evident in the small bowel by 20 minutes. The gallbladder fails to fill at 60 minutes. Patient was agitated and could not proceed further with imaging. IMPRESSION: Non filling of the gallbladder at 60 minutes could indicate cystic duct obstruction. Patient could not tolerate further imaging. Equivocal exam Electronically Signed   By: Genevive Bi M.D.   On: 12/05/2022 14:07   DG CHEST PORT 1 VIEW  Result Date: 12/04/2022 CLINICAL DATA:  Shortness of breath.  Sepsis EXAM: PORTABLE CHEST 1 VIEW COMPARISON:  X-ray 12/03/2022 and  older FINDINGS: Underinflation. Small effusions. Adjacent mild opacity is stable. No pneumothorax. Stable enlarged cardiopericardial silhouette. No edema. Overlapping cardiac leads. Degenerative changes. IMPRESSION: No significant interval change when adjusting for technique. Electronically Signed   By: Karen Kays M.D.   On: 12/04/2022 14:02   US Abdomen Limited RUQ (LIVER/GB)  Result Date: 12/04/2022 CLINICAL DATA:  Cholecystitis EXAM: ULTRASOUND ABDOMEN LIMITED RIGHT UPPER QUADRANT COMPARISON:  Ultrasound 05/30/2022 and CT scan 12/03/2022. FINDINGS: Gallbladder: Distended gallbladder. Slight wall thickening but the patient has a known chronic liver disease. Previous areas of stone is not well appreciated today. This could be technical. Common bile duct: Diameter: 3 mm Liver: Heterogeneous liver consistent with known chronic liver disease. There is a anechoic rounded structure with through transmission in the liver measuring 6.5 cm consistent with a cyst. Portal vein is patent on color Doppler imaging with normal direction of blood flow towards the liver. Other: None. IMPRESSION: Nodular echogenic liver consistent with known chronic liver disease. Hepatic cysts. Please correlate with the prior CT scan. Distended gallbladder with wall thickening but nonspecific in the presence of chronic liver disease. There is some sludge. The stone seen previously are not as well seen on this ultrasound but are likely present based on prior imaging. If there is further concern of acute cholecystitis a HIDA scan could be considered as clinically appropriate for further delineation. Electronically  Signed   By: Karen Kays M.D.   On: 12/04/2022 14:01    Anti-infectives: Anti-infectives (From admission, onward)    Start     Dose/Rate Route Frequency Ordered Stop   12/06/22 0800  doxycycline (VIBRAMYCIN) 100 mg in dextrose 5 % 250 mL IVPB        100 mg 125 mL/hr over 120 Minutes Intravenous 2 times daily 12/06/22 0620      12/05/22 1145  cefTRIAXone (ROCEPHIN) 2 g in sodium chloride 0.9 % 100 mL IVPB        2 g 200 mL/hr over 30 Minutes Intravenous To Radiology 12/05/22 1059 12/05/22 1215   12/04/22 0945  cefTRIAXone (ROCEPHIN) 2 g in sodium chloride 0.9 % 100 mL IVPB        2 g 200 mL/hr over 30 Minutes Intravenous Every 24 hours 12/04/22 0849     12/04/22 0600  piperacillin-tazobactam (ZOSYN) IVPB 3.375 g  Status:  Discontinued        3.375 g 12.5 mL/hr over 240 Minutes Intravenous Every 8 hours 12/03/22 2231 12/04/22 0849   12/03/22 2231  vancomycin variable dose per unstable renal function (pharmacist dosing)  Status:  Discontinued         Does not apply See admin instructions 12/03/22 2231 12/04/22 0849   12/03/22 1900  ceFEPIme (MAXIPIME) 2 g in sodium chloride 0.9 % 100 mL IVPB        2 g 200 mL/hr over 30 Minutes Intravenous  Once 12/03/22 1849 12/03/22 2025   12/03/22 1900  metroNIDAZOLE (FLAGYL) IVPB 500 mg        500 mg 100 mL/hr over 60 Minutes Intravenous  Once 12/03/22 1849 12/03/22 2131   12/03/22 1900  vancomycin (VANCOCIN) IVPB 1000 mg/200 mL premix  Status:  Discontinued        1,000 mg 200 mL/hr over 60 Minutes Intravenous  Once 12/03/22 1849 12/03/22 1851   12/03/22 1900  vancomycin (VANCOCIN) 2,500 mg in sodium chloride 0.9 % 500 mL IVPB        2,500 mg 262.5 mL/hr over 120 Minutes Intravenous  Once 12/03/22 1853 12/03/22 2256       Assessment/Plan  77 yo male with recurrent cholecystitis in setting of cirrhosis (ETOH). Attempted cholecystostomy tube insertion yesterday but patient became disoriented and procedure was cancelled. I discussed with Dr. Randol Kern with plan to continue antibiotics until delerium has improved. I discussed with pts wife that cirrhosis can make people more likely to have confusion related to acute illness, hospitalization, or medications  Acute calculous cholecystitis - CT and US showing stones (though [place] radiologist hedges), HIDA read and nonfilling  gallbladder (though circular concentrated filling present in images 33-51). Attempted cholecystostomy tube 11/1 but patient became disoriented. Continue ceftriaxone Cirrhosis (h/o of ETOH) - small volume ascites on CT scan, splenomegaly, thrombocytopenia, MELD 21   FEN - NPO VTE - SCDs, thrombocytopenia ID - ceftriaxone 10/31-->, doxcycline 11/3 (added today due to agitation and concern for pneumonia Dispo - inpatient for antibiotics  I reviewed last 24 h vitals and pain scores, last 48 h intake and output, last 24 h labs and trends, and last 24 h imaging results.  This care required high  level of medical decision making.    LOS: 3 days   [facility] Surgery 12/06/2022, 9:27 AM Please see Amion for pager number during day hours 7:00am-4:30pm or 7:00am -11:30am on weekends

## 2022-12-06 NOTE — Progress Notes (Signed)
Unable to complete perc chole yesterday due to disorientation/agitation, procedure aborted.  Pt currently stable, WBC down and Lactic acid also down. Currently on IV abx. However, given non-viz/occluded cystic duct, pt still to benefit from perc chole drain.  Will need to be done with anesthesia. Anesthesia contacted, currently cannot guarantee a time/staff for procedure to be done on Monday but will tentatively aim for procedure on Monday.  Brayton El PA-C Interventional Radiology 12/06/2022 1:59 PM

## 2022-12-07 DIAGNOSIS — A419 Sepsis, unspecified organism: Secondary | ICD-10-CM | POA: Diagnosis not present

## 2022-12-07 DIAGNOSIS — R6521 Severe sepsis with septic shock: Secondary | ICD-10-CM | POA: Diagnosis not present

## 2022-12-07 LAB — COMPREHENSIVE METABOLIC PANEL
ALT: 27 U/L (ref 0–44)
AST: 54 U/L — ABNORMAL HIGH (ref 15–41)
Albumin: 2.4 g/dL — ABNORMAL LOW (ref 3.5–5.0)
Alkaline Phosphatase: 67 U/L (ref 38–126)
Anion gap: 10 (ref 5–15)
BUN: 24 mg/dL — ABNORMAL HIGH (ref 8–23)
CO2: 21 mmol/L — ABNORMAL LOW (ref 22–32)
Calcium: 8 mg/dL — ABNORMAL LOW (ref 8.9–10.3)
Chloride: 108 mmol/L (ref 98–111)
Creatinine, Ser: 1.12 mg/dL (ref 0.61–1.24)
GFR, Estimated: >60 mL/min (ref >60)
Glucose, Bld: 172 mg/dL — ABNORMAL HIGH (ref 70–99)
Potassium: 3.4 mmol/L — ABNORMAL LOW (ref 3.5–5.1)
Sodium: 139 mmol/L (ref 135–145)
Total Bilirubin: 1.3 mg/dL — ABNORMAL HIGH (ref 0.3–1.2)
Total Protein: 5 g/dL — ABNORMAL LOW (ref 6.5–8.1)

## 2022-12-07 LAB — CULTURE, BLOOD (ROUTINE X 2): Special Requests: ADEQUATE

## 2022-12-07 LAB — GLUCOSE, CAPILLARY
Glucose-Capillary: 140 mg/dL — ABNORMAL HIGH (ref 70–99)
Glucose-Capillary: 146 mg/dL — ABNORMAL HIGH (ref 70–99)
Glucose-Capillary: 149 mg/dL — ABNORMAL HIGH (ref 70–99)
Glucose-Capillary: 156 mg/dL — ABNORMAL HIGH (ref 70–99)
Glucose-Capillary: 156 mg/dL — ABNORMAL HIGH (ref 70–99)
Glucose-Capillary: 177 mg/dL — ABNORMAL HIGH (ref 70–99)
Glucose-Capillary: 178 mg/dL — ABNORMAL HIGH (ref 70–99)

## 2022-12-07 LAB — CBC
HCT: 32.2 % — ABNORMAL LOW (ref 39.0–52.0)
Hemoglobin: 11.1 g/dL — ABNORMAL LOW (ref 13.0–17.0)
MCH: 32.5 pg (ref 26.0–34.0)
MCHC: 34.5 g/dL (ref 30.0–36.0)
MCV: 94.2 fL (ref 80.0–100.0)
Platelets: 49 10*3/uL — ABNORMAL LOW (ref 150–400)
RBC: 3.42 MIL/uL — ABNORMAL LOW (ref 4.22–5.81)
RDW: 14.6 % (ref 11.5–15.5)
WBC: 7.5 10*3/uL (ref 4.0–10.5)
nRBC: 0 % (ref 0.0–0.2)

## 2022-12-07 LAB — TROPONIN I (HIGH SENSITIVITY)
Troponin I (High Sensitivity): 146 ng/L (ref <18)
Troponin I (High Sensitivity): 142 ng/L (ref <18)

## 2022-12-07 LAB — PHOSPHORUS: Phosphorus: 1.7 mg/dL — ABNORMAL LOW (ref 2.5–4.6)

## 2022-12-07 MED ORDER — HALOPERIDOL LACTATE 5 MG/ML IJ SOLN
5.0000 mg | Freq: Once | INTRAMUSCULAR | Status: DC
Start: 2022-12-07 — End: 2022-12-07

## 2022-12-07 MED ORDER — HALOPERIDOL LACTATE 5 MG/ML IJ SOLN
5.0000 mg | Freq: Once | INTRAMUSCULAR | Status: AC
  Administered 2022-12-07: 5 mg via INTRAMUSCULAR
  Filled 2022-12-07: qty 1

## 2022-12-07 MED ORDER — K PHOS MONO-SOD PHOS DI & MONO 155-852-130 MG PO TABS
500.0000 mg | ORAL_TABLET | Freq: Two times a day (BID) | ORAL | Status: AC
  Administered 2022-12-07: 500 mg via ORAL
  Filled 2022-12-07 (×2): qty 2

## 2022-12-07 MED ORDER — DEXTROSE-SODIUM CHLORIDE 5-0.45 % IV SOLN: INTRAVENOUS | Status: AC

## 2022-12-07 MED ORDER — QUETIAPINE FUMARATE 100 MG PO TABS
100.0000 mg | ORAL_TABLET | Freq: Every day | ORAL | Status: DC
  Administered 2022-12-07 – 2022-12-09 (×3): 100 mg via ORAL
  Filled 2022-12-07 (×3): qty 1

## 2022-12-07 MED ORDER — HALOPERIDOL LACTATE 5 MG/ML IJ SOLN: 5.0000 mg | Freq: Once | INTRAMUSCULAR | Status: DC

## 2022-12-07 MED ORDER — SODIUM PHOSPHATES 45 MMOLE/15ML IV SOLN
30.0000 mmol | Freq: Once | INTRAVENOUS | Status: AC
  Administered 2022-12-07: 30 mmol via INTRAVENOUS
  Filled 2022-12-07: qty 10

## 2022-12-07 MED ORDER — POTASSIUM CHLORIDE CRYS ER 20 MEQ PO TBCR
40.0000 meq | EXTENDED_RELEASE_TABLET | Freq: Once | ORAL | Status: DC
  Filled 2022-12-07: qty 2

## 2022-12-07 MED ORDER — LORAZEPAM 2 MG/ML IJ SOLN: 1.0000 mg | Freq: Once | INTRAMUSCULAR | Status: DC

## 2022-12-07 MED ORDER — LORAZEPAM 2 MG/ML IJ SOLN
2.0000 mg | Freq: Once | INTRAMUSCULAR | Status: AC
  Administered 2022-12-07: 2 mg via INTRAVENOUS
  Filled 2022-12-07: qty 1

## 2022-12-07 NOTE — Progress Notes (Signed)
Chair alarm activated, the patient is found attempting to get out of the chair, two person assist required to help patient stand with walker and redirect to sit back into the chair.

## 2022-12-07 NOTE — Progress Notes (Signed)
SLP Cancellation Note  Patient Details Name: Patrick Kidd MRN: [id number] DOB: Oct 11, 1945   Cancelled treatment:       Reason Eval/Treat Not Completed: Patient's level of consciousness Patient asleep in recliner with mouth open. Responded minimally to commands for SLP to wipe secretions from lips. SLP will continue efforts.  Angela Nevin, MA, CCC-SLP Speech Therapy

## 2022-12-07 NOTE — Progress Notes (Signed)
PT Cancellation Note  Patient Details Name: Patrick Kidd MRN: [id number] DOB: March 09, 1945   Cancelled Treatment:    Reason Eval/Treat Not Completed: Medical issues which prohibited therapy - plan for drain placement tomorrow, PT to check back post-procedure per instruction of MD.   Marye Round, PT DPT Acute Rehabilitation Services Secure Chat Preferred  Office [phone]    Truddie Coco 12/07/2022, 3:57 PM

## 2022-12-07 NOTE — Progress Notes (Signed)
Subjective: Confusion overnight  ROS: See above, otherwise other systems negative  Objective: Vital signs in last 24 hours: Temp:  [97.7 F (36.5 C)-97.8 F (36.6 C)] 97.7 F (36.5 C) (11/03 0817) Pulse Rate:  [87-101] 96 (11/03 0817) Resp:  [18-20] 20 (11/03 0817) BP: (124-156)/(68-82) 142/81 (11/03 0817) SpO2:  [94 %-99 %] 98 % (11/03 0817) Weight:  [113.1 kg] 113.1 kg (11/02 1056) Last BM Date : 12/04/22  Intake/Output from previous day: No intake/output data recorded. Intake/Output this shift: No intake/output data recorded.  PE: Gen: NAD, disoriented Resp: nonlabored CV: RRR Abd: soft, nontender  Lab Results:  Recent Labs    12/06/22 0443 12/07/22 0344  WBC 12.5* 7.5  HGB 11.7* 11.1*  HCT 33.9* 32.2*  PLT 47* 49*   BMET Recent Labs    12/06/22 0443 12/07/22 0344  NA 138 139  K 4.1 3.4*  CL 107 108  CO2 21* 21*  GLUCOSE 146* 172*  BUN 35* 24*  CREATININE 1.37* 1.12  CALCIUM 7.9* 8.0*   PT/INR No results for input(s): "LABPROT", "INR" in the last 72 hours. CMP     Component Value Date/Time   NA 139 12/07/2022 0344   K 3.4 (L) 12/07/2022 0344   CL 108 12/07/2022 0344   CO2 21 (L) 12/07/2022 0344   GLUCOSE 172 (H) 12/07/2022 0344   BUN 24 (H) 12/07/2022 0344   CREATININE 1.12 12/07/2022 0344   CREATININE 0.95 11/19/2022 1052   CREATININE 1.13 06/04/2010 1354   CALCIUM 8.0 (L) 12/07/2022 0344   PROT 5.0 (L) 12/07/2022 0344   ALBUMIN 2.4 (L) 12/07/2022 0344   AST 54 (H) 12/07/2022 0344   AST 21 11/19/2022 1052   ALT 27 12/07/2022 0344   ALT 15 11/19/2022 1052   ALKPHOS 67 12/07/2022 0344   BILITOT 1.3 (H) 12/07/2022 0344   BILITOT 1.6 (H) 11/19/2022 1052   GFRNONAA >60 12/07/2022 0344   GFRNONAA >60 11/19/2022 1052   GFRAA >60 02/06/2019 0546   Lipase     Component Value Date/Time   LIPASE 41 12/02/2022 0446    Studies/Results: ECHOCARDIOGRAM COMPLETE  Result Date: 12/06/2022    ECHOCARDIOGRAM REPORT   Patient Name:    Patrick Kidd Date of Exam: 12/06/2022 Medical Rec #:  [id number]          Height:       75.0 in Accession #:    [id number]         Weight:       249.3 lb Date of Birth:  04-05-1945          BSA:          2.410 m Patient Age:    77 years           BP:           128/70 mmHg Patient Gender: M                  HR:           91 bpm. Exam Location:  Inpatient Procedure: 2D Echo Indications:    elevated troponin  History:        Patient has prior history of Echocardiogram examinations, most                 recent 01/29/2019. Chronic kidney disease, Arrythmias:WPW; Risk                 Factors:Diabetes, Hypertension and Dyslipidemia.  Sonographer:  Delcie Roch RDCS Referring Phys: [id number] DAWOOD Teena Irani  Sonographer Comments: Image acquisition challenging due to uncooperative patient. IMPRESSIONS  1. Left ventricular ejection fraction, by estimation, is 55 to 60%. The left ventricle has normal function. The left ventricle has no regional wall motion abnormalities. There is mild left ventricular hypertrophy. Left ventricular diastolic parameters are indeterminate.  2. Right ventricular systolic function is normal. The right ventricular size is mildly enlarged. There is moderately elevated pulmonary artery systolic pressure.  3. Left atrial size was mildly dilated.  4. The mitral valve is normal in structure. Trivial mitral valve regurgitation. No evidence of mitral stenosis.  5. The tricuspid valve is abnormal.  6. The aortic valve is tricuspid. Aortic valve regurgitation is not visualized. No aortic stenosis is present.  7. The inferior vena cava is dilated in size with <50% respiratory variability, suggesting right atrial pressure of 15 mmHg. FINDINGS  Left Ventricle: Left ventricular ejection fraction, by estimation, is 55 to 60%. The left ventricle has normal function. The left ventricle has no regional wall motion abnormalities. The left ventricular internal cavity size was normal in size. There is  mild  left ventricular hypertrophy. Left ventricular diastolic parameters are indeterminate. Right Ventricle: The right ventricular size is mildly enlarged. Right vetricular wall thickness was not well visualized. Right ventricular systolic function is normal. There is moderately elevated pulmonary artery systolic pressure. The tricuspid regurgitant velocity is 3.04 m/s, and with an assumed right atrial pressure of 15 mmHg, the estimated right ventricular systolic pressure is 52.0 mmHg. Left Atrium: Left atrial size was mildly dilated. Right Atrium: Right atrial size was normal in size. Pericardium: There is no evidence of pericardial effusion. Mitral Valve: The mitral valve is normal in structure. Trivial mitral valve regurgitation. No evidence of mitral valve stenosis. Tricuspid Valve: The tricuspid valve is abnormal. Tricuspid valve regurgitation is mild . No evidence of tricuspid stenosis. Aortic Valve: The aortic valve is tricuspid. Aortic valve regurgitation is not visualized. No aortic stenosis is present. Aortic valve mean gradient measures 4.9 mmHg. Aortic valve peak gradient measures 11.1 mmHg. Aortic valve area, by VTI measures 2.00  cm. Pulmonic Valve: The pulmonic valve was not well visualized. Pulmonic valve regurgitation is trivial. No evidence of pulmonic stenosis. Aorta: The aortic root and ascending aorta are structurally normal, with no evidence of dilitation. Venous: The inferior vena cava is dilated in size with less than 50% respiratory variability, suggesting right atrial pressure of 15 mmHg. IAS/Shunts: No atrial level shunt detected by color flow Doppler.  LEFT VENTRICLE PLAX 2D LVIDd:         5.10 cm      Diastology LVIDs:         3.40 cm      LV e' medial:    5.98 cm/s LV PW:         1.20 cm      LV E/e' medial:  13.4 LV IVS:        1.10 cm      LV e' lateral:   8.38 cm/s LVOT diam:     1.90 cm      LV E/e' lateral: 9.5 LV SV:         59 LV SV Index:   24 LVOT Area:     2.84 cm  LV Volumes  (MOD) LV vol d, MOD A2C: 104.0 ml LV vol d, MOD A4C: 103.0 ml LV vol s, MOD A2C: 53.4 ml LV vol s, MOD A4C: 52.2 ml LV  SV MOD A2C:     50.6 ml LV SV MOD A4C:     103.0 ml LV SV MOD BP:      51.1 ml RIGHT VENTRICLE             IVC RV Basal diam:  3.40 cm     IVC diam: 2.80 cm RV S prime:     14.70 cm/s TAPSE (M-mode): 1.8 cm LEFT ATRIUM           Index        RIGHT ATRIUM           Index LA diam:      3.50 cm 1.45 cm/m   RA Area:     20.80 cm LA Vol (A2C): 67.4 ml 27.97 ml/m  RA Volume:   65.20 ml  27.05 ml/m LA Vol (A4C): 95.6 ml 39.67 ml/m  AORTIC VALVE AV Area (Vmax):    1.84 cm AV Area (Vmean):   2.08 cm AV Area (VTI):     2.00 cm AV Vmax:           166.83 cm/s AV Vmean:          99.839 cm/s AV VTI:            0.295 m AV Peak Grad:      11.1 mmHg AV Mean Grad:      4.9 mmHg LVOT Vmax:         108.00 cm/s LVOT Vmean:        73.300 cm/s LVOT VTI:          0.208 m LVOT/AV VTI ratio: 0.70  AORTA Ao Root diam: 3.40 cm Ao Asc diam:  3.40 cm MITRAL VALVE               TRICUSPID VALVE MV Area (PHT): 4.80 cm    TR Peak grad:   37.0 mmHg MV Decel Time: 158 msec    TR Vmax:        304.00 cm/s MV E velocity: 79.90 cm/s MV A velocity: 81.10 cm/s  SHUNTS MV E/A ratio:  0.99        Systemic VTI:  0.21 m                            Systemic Diam: 1.90 cm Dina Rich MD Electronically signed by Dina Rich MD Signature Date/Time: 12/06/2022/3:27:17 PM    Final    DG CHEST PORT 1 VIEW  Result Date: 12/06/2022 CLINICAL DATA:  Dyspnea, cough EXAM: PORTABLE CHEST 1 VIEW COMPARISON:  12/04/2022 FINDINGS: Lung volumes are small. Developing right basilar opacity may represent increasing atelectasis or developing focal pulmonary infiltrate. No pneumothorax or pleural effusion. Cardiac size within normal limits. Pulmonary vascular crowding at the hila without overt pulmonary edema. No acute bone abnormality. IMPRESSION: 1. Pulmonary hypoinflation. 2. Developing right basilar opacity, atelectasis versus developing focal  pulmonary infiltrate. Electronically Signed   By: Helyn Numbers M.D.   On: 12/06/2022 03:17   DG Abd 1 View  Result Date: 12/06/2022 CLINICAL DATA:  Cough, abdominal pain EXAM: ABDOMEN - 1 VIEW COMPARISON:  05/30/2022 FINDINGS: Right flank is excluded from view. Visualized abdominal gas pattern is nonobstructive. No gross free intraperitoneal gas. Underpenetration precludes evaluation of the visceral shadows. Osseous structures are age-appropriate. IMPRESSION: 1. Nonobstructive abdominal gas pattern. Electronically Signed   By: Helyn Numbers M.D.   On: 12/06/2022 03:15   IR Perc Cholecystostomy  Result Date: 12/05/2022  INDICATION: 77 year old male with acute cholecystitis EXAM: IMAGE GUIDED PERCUTANEOUS CHOLECYSTOSTOMY DISCONTINUATION OF PERCUTANEOUS CHOLECYSTOSTOMY MEDICATIONS: None ANESTHESIA/SEDATION: Moderate (conscious) sedation was not employed during this procedure. A total of Versed 1.0 mg and Fentanyl 0 mcg was administered intravenously. Moderate Sedation Time: 0 minutes. The patient's level of consciousness and vital signs were monitored continuously by radiology nursing throughout the procedure under my direct supervision. FLUOROSCOPY TIME:  None COMPLICATIONS: None PROCEDURE: Informed written consent was obtained from the patient after a thorough discussion of the procedural risks, benefits and alternatives. All questions were addressed. Maximal Sterile Barrier Technique was utilized including caps, mask, sterile gowns, sterile gloves, sterile drape, hand hygiene and skin antiseptic. A timeout was performed prior to the initiation of the procedure. The patient was laid flat on the table under the image intensifier. Before prep and drape could occur, the patient began complaining of crossed a phobia and difficulty breathing. 1 mg of anxiolytic was administered, which did not help him. The patient sat up and I had a long conversation with the patient about the necessity of the procedure. He  refused to continue, essentially withdrawing his consent and we were unable to proceed. He would not continue with the procedure. We had no choice but to discontinue. IMPRESSION: Discontinued attempt at percutaneous cholecystostomy as above Electronically Signed   By: Gilmer Mor D.O.   On: 12/05/2022 17:16   NM Hepatobiliary Liver Func  Result Date: 12/05/2022 CLINICAL DATA:  Abdominal pain.  Cholelithiasis. EXAM: NUCLEAR MEDICINE HEPATOBILIARY IMAGING TECHNIQUE: Sequential images of the abdomen were obtained out to 60 minutes following intravenous administration of radiopharmaceutical. RADIOPHARMACEUTICALS:  5.0 mCi Tc-20m  Choletec IV COMPARISON:  CT 12/04/2022 FINDINGS: Patient image for 1 hour. Counts are read bili clear from the blood pool in thickened of the liver. Counts are evident in the small bowel by 20 minutes. The gallbladder fails to fill at 60 minutes. Patient was agitated and could not proceed further with imaging. IMPRESSION: Non filling of the gallbladder at 60 minutes could indicate cystic duct obstruction. Patient could not tolerate further imaging. Equivocal exam Electronically Signed   By: Genevive Bi M.D.   On: 12/05/2022 14:07    Anti-infectives: Anti-infectives (From admission, onward)    Start     Dose/Rate Route Frequency Ordered Stop   12/06/22 0800  doxycycline (VIBRAMYCIN) 100 mg in dextrose 5 % 250 mL IVPB        100 mg 125 mL/hr over 120 Minutes Intravenous 2 times daily 12/06/22 0620     12/05/22 1145  cefTRIAXone (ROCEPHIN) 2 g in sodium chloride 0.9 % 100 mL IVPB        2 g 200 mL/hr over 30 Minutes Intravenous To Radiology 12/05/22 1059 12/05/22 1215   12/04/22 0945  cefTRIAXone (ROCEPHIN) 2 g in sodium chloride 0.9 % 100 mL IVPB        2 g 200 mL/hr over 30 Minutes Intravenous Every 24 hours 12/04/22 0849     12/04/22 0600  piperacillin-tazobactam (ZOSYN) IVPB 3.375 g  Status:  Discontinued        3.375 g 12.5 mL/hr over 240 Minutes Intravenous Every  8 hours 12/03/22 2231 12/04/22 0849   12/03/22 2231  vancomycin variable dose per unstable renal function (pharmacist dosing)  Status:  Discontinued         Does not apply See admin instructions 12/03/22 2231 12/04/22 0849   12/03/22 1900  ceFEPIme (MAXIPIME) 2 g in sodium chloride 0.9 % 100 mL IVPB  2 g 200 mL/hr over 30 Minutes Intravenous  Once 12/03/22 1849 12/03/22 2025   12/03/22 1900  metroNIDAZOLE (FLAGYL) IVPB 500 mg        500 mg 100 mL/hr over 60 Minutes Intravenous  Once 12/03/22 1849 12/03/22 2131   12/03/22 1900  vancomycin (VANCOCIN) IVPB 1000 mg/200 mL premix  Status:  Discontinued        1,000 mg 200 mL/hr over 60 Minutes Intravenous  Once 12/03/22 1849 12/03/22 1851   12/03/22 1900  vancomycin (VANCOCIN) 2,500 mg in sodium chloride 0.9 % 500 mL IVPB        2,500 mg 262.5 mL/hr over 120 Minutes Intravenous  Once 12/03/22 1853 12/03/22 2256       Assessment/Plan  77 yo male with recurrent cholecystitis in setting of cirrhosis (ETOH). Attempted cholecystostomy tube insertion yesterday but patient became disoriented and procedure was cancelled. I discussed with Dr. Randol Kern with plan to continue antibiotics until delerium has improved. I discussed with pts wife that cirrhosis can make people more likely to have confusion related to acute illness, hospitalization, or medications   Acute calculous cholecystitis - CT and US showing stones (though [place] radiologist hedges), HIDA read and nonfilling gallbladder (though circular concentrated filling present in images 33-51). Attempted cholecystostomy tube 11/1 but patient became disoriented. Continue ceftriaxone Cirrhosis (h/o of ETOH) - small volume ascites on CT scan, splenomegaly, thrombocytopenia, MELD 21     FEN - carb mod diet, NPO overnight VTE - SCDs, thrombocytopenia ID - ceftriaxone 10/31-->, doxcycline 11/3 (added today due to agitation and concern for pneumonia Dispo - inpatient for antibiotics, potential drain  procedure with anesthesia tomorrow  I reviewed last 24 h vitals and pain scores, last 48 h intake and output, last 24 h labs and trends, and last 24 h imaging results.  This care required moderate level of medical decision making.    LOS: 4 days   [facility] Surgery 12/07/2022, 9:30 AM Please see Amion for pager number during day hours 7:00am-4:30pm or 7:00am -11:30am on weekends

## 2022-12-07 NOTE — Plan of Care (Signed)
Pt increasingly agitated. Restraint orders received but restraints were not applied. Wife at bedside and updated via [facility] team.   Problem: Education: Goal: Ability to describe self-care measures that may prevent or decrease complications (Diabetes Survival Skills Education) will improve Outcome: Progressing Goal: Individualized Educational Video(s) Outcome: Progressing   Problem: Coping: Goal: Ability to adjust to condition or change in health will improve Outcome: Progressing   Problem: Fluid Volume: Goal: Ability to maintain a balanced intake and output will improve Outcome: Progressing   Problem: Health Behavior/Discharge Planning: Goal: Ability to identify and utilize available resources and services will improve Outcome: Progressing Goal: Ability to manage health-related needs will improve Outcome: Progressing   Problem: Metabolic: Goal: Ability to maintain appropriate glucose levels will improve Outcome: Progressing   Problem: Nutritional: Goal: Maintenance of adequate nutrition will improve Outcome: Progressing Goal: Progress toward achieving an optimal weight will improve Outcome: Progressing   Problem: Skin Integrity: Goal: Risk for impaired skin integrity will decrease Outcome: Progressing   Problem: Tissue Perfusion: Goal: Adequacy of tissue perfusion will improve Outcome: Progressing   Problem: Education: Goal: Knowledge of General Education information will improve Description: Including pain rating scale, medication(s)/side effects and non-pharmacologic comfort measures Outcome: Progressing   Problem: Health Behavior/Discharge Planning: Goal: Ability to manage health-related needs will improve Outcome: Progressing   Problem: Clinical Measurements: Goal: Ability to maintain clinical measurements within normal limits will improve Outcome: Progressing Goal: Will remain free from infection Outcome: Progressing Goal: Diagnostic test results will  improve Outcome: Progressing Goal: Respiratory complications will improve Outcome: Progressing Goal: Cardiovascular complication will be avoided Outcome: Progressing   Problem: Activity: Goal: Risk for activity intolerance will decrease Outcome: Progressing   Problem: Nutrition: Goal: Adequate nutrition will be maintained Outcome: Progressing   Problem: Coping: Goal: Level of anxiety will decrease Outcome: Progressing   Problem: Elimination: Goal: Will not experience complications related to bowel motility Outcome: Progressing Goal: Will not experience complications related to urinary retention Outcome: Progressing   Problem: Pain Management: Goal: General experience of comfort will improve Outcome: Progressing   Problem: Safety: Goal: Ability to remain free from injury will improve Outcome: Progressing   Problem: Skin Integrity: Goal: Risk for impaired skin integrity will decrease Outcome: Progressing   Problem: Safety: Goal: Non-violent Restraint(s) Outcome: Progressing

## 2022-12-07 NOTE — Progress Notes (Addendum)
PROGRESS NOTE    Patrick Kidd  XBM:[id number] DOB: May 17, 1945 DOA: 12/03/2022 PCP: Etta Grandchild, MD   Chief Complaint  Patient presents with   Fever    Fever chills and diaphoresis onset around 1600 today. Pt was seen at [facility] yesterday. Hx of cirrhosis of the liver and gallstones. Abd distention noted. Low grade fever pta 100.4. EMS reports pt HR 150 pta and sating 83 percent on RA. Pt given bolus and placed on 3L [place]. Pt systolic 90s to 100s and tachypneic in the 30s pta.    Brief Narrative:   Patrick Kidd is a 77 y.o. male who has a PMH as below including but not limited to EtOH cirrhosis well controlled and no longer drinking, PUD requiring cautery in the past, DM, cholecystitis. He had admission back in April 2024 for acute calculous cholecystitis and E.coli bacteremia. He was deemed to be at increased risk for surgery; therefore, he was treated conservatively with abx with outpatient GI follow up. He did well following that admission with no issues.   On 10/29, he began to have RUQ pain that prompted an ED visit to [facility]. He felt improved with nausea meds, pain meds, GI cocktail and refused imaging at the time and asked to be discharged home.   On 10/30, he was doing well besides some pain. He called Dr. Chiquita Loth with GI for an Rx for pain pills that could get him through until his follow up appointment which was scheduled for 10/31. Around 1600 he began to have chills. These got worse so he came back to ED.   In ED, he was hypotensive and shocky. He received 3L NS with some improvement in BP during 3rd liter. He also had AKI, lactic acidosis, leukopenia, thrombocytopenia. CT A/P demonstrated acute cholecystitis. Given his constellation of lab abnormalities and concern that his pressure would deteriorate further, patient was admitted to ICU, his blood pressure has improved, general surgery has been consulted, recommendation for percutaneous cholecystostomy drain insertion for  which IR has been consulted, unfortunately percutaneous cholecystostomy drain by IR unsuccessful due to medication noncompliance..  Assessment & Plan:   Principal Problem:   Septic shock (HCC)    Septic shock due to acute calculus cholecystitis with E. coli and Klebsiella bacteremia  -General Surgery input greatly appreciated, patient is not an operative candidate, recommendation for percutaneous [place]. -Unsuccessful attempt by IR for percutaneous drain insertion yesterday due to patient noncompliance, I have discussed with both surgery, and IR, patient will need procedure done, it will be likely under anesthesia, likely will happen on Monday as discussed with IR, which should be appropriate whereby then he will be medically optimized.  It can be done sooner-emergent if he is in shock. . -Continue with IV Rocephin 2 g daily for bacteremia and cholecystitis -No further requirement of vasopressors, weaned off morning 10-31 -Plan for percutaneous cholecystostomy drain, likely under general esthesia, timing per IR, but hopefully by tomorrow if anesthesia can be arranged (patient is medically optimized for procedure under general anesthesia if needed, no further workup is needed prior to procedure, I have discussed with wife, she understands risks and benefit)  Acute metabolic encephalopathy  Hospital delirium  -Patient is high risk of delirium given his liver disease, sepsis  -He is with significant agitation and restlessness, none cooperative with procedure earlier. -Wife denies any alcohol abuse for few years  -Continue with as needed Haldol for agitation, will increase his Seroquel and give earlier at bedtime as it  does appear hospital delirium significantly contributing to his confusion. -Continue with IV fluids  acute kidney injury:  -Suspect ATN in setting of sepsis, hypovolemia possible now adequate fluid resuscitated.  No hydronephrosis on CT abdomen pelvis. -Continue with IV fluids and  avoid nephrotoxic medications   Diabetes with hyperglycemia:  - on 50 units long acting at home -- SSI --carb modified once taking PO  Hypophosphatemia Hypokalemia - repleted   Thrombocytopenia:  -In setting of CLL, and liver cirrhosis worsened with consumption of sepsis -- SCDs, hold chemoprophylaxis until it is greater than 50 -- will transfuse if invasive procedures (perc chole tube) are recommended   Cirrhosis: Child Pugh A (6 points) --well compensated, feel would tolerate procedures  Elevated troponins -this is most likely in the setting of the septic shock, his troponins trending down, non-ACS pattern, the echo has been obtained, with a preserved EF, and no regional wall motion abnormalities, no further workup is needed prior to proceeding with percutaneous cholecystostomy under general anesthesia. .       DVT prophylaxis: SCD Code Status: Full Family Communication: D/W wife at bedside Disposition:   Status is: Inpatient    Consultants:  PCCM General surgery Interventional radiology  Subjective:  Patient is altered, cannot provide any reliable complaints, he was restless, agitated overnight as discussed with staff  Objective: Vitals:   12/07/22 0336 12/07/22 0400 12/07/22 0500 12/07/22 0817  BP: (!) 156/82 132/70  (!) 142/81  Pulse: 95 93 90 96  Resp:    20  Temp:    97.7 F (36.5 C)  TempSrc:    Oral  SpO2: 98% 97% 98% 98%  Weight:      Height:       No intake or output data in the 24 hours ending 12/07/22 1214 Filed Weights   12/03/22 2320 12/04/22 0356 12/06/22 1056  Weight: 113.1 kg 113.1 kg 113.1 kg    Examination:  Patient appears calm, comfortable, Symmetrical Chest wall movement, Good air movement bilaterally Tachycardic,No Gallops,Rubs or new Murmurs, No Parasternal Heave +ve B.Sounds, Abd Soft, No tenderness, No rebound - guarding or rigidity. No Cyanosis, Clubbing or edema, No new Rash or bruise     Data Reviewed: I have  personally reviewed following labs and imaging studies  CBC: Recent Labs  Lab 12/02/22 0446 12/03/22 1850 12/04/22 0031 12/05/22 1301 12/06/22 0443 12/07/22 0344  WBC 7.6 2.9* 9.5 14.7* 12.5* 7.5  NEUTROABS 4.3 1.9  --   --  10.9*  --   HGB 15.6 14.7 12.6* 12.9* 11.7* 11.1*  HCT 44.6 41.8 36.4* 37.4* 33.9* 32.2*  MCV 94.5 95.9 94.3 96.6 93.9 94.2  PLT 84* 58* 41* 42* 47* 49*    Basic Metabolic Panel: Recent Labs  Lab 12/03/22 1850 12/04/22 0031 12/05/22 1124 12/06/22 0443 12/07/22 0344  NA 136 134* 134* 138 139  K 3.9 3.6 4.5 4.1 3.4*  CL 103 102 104 107 108  CO2 14* 19* 16* 21* 21*  GLUCOSE 192* 153* 107* 146* 172*  BUN 27* 31* 46* 35* 24*  CREATININE 1.88* 1.90* 1.71* 1.37* 1.12  CALCIUM 8.1* 7.8* 7.7* 7.9* 8.0*  MG  --  1.6* 2.4  --   --   PHOS  --  2.1* 3.0  --  1.7*    GFR: Estimated Creatinine Clearance: 73.7 mL/min (by C-G formula based on SCr of 1.12 mg/dL).  Liver Function Tests: Recent Labs  Lab 12/02/22 0446 12/03/22 1850 12/05/22 1124 12/06/22 0443 12/07/22 0344  AST 24 36  103* 94* 54*  ALT 14 22 28 31 27   ALKPHOS 76 208* 57 57 67  BILITOT 1.1 2.7* 1.3* 1.4* 1.3*  PROT 7.4 5.5* 5.4* 5.5* 5.0*  ALBUMIN 4.2 3.0* 2.7* 2.6* 2.4*    CBG: Recent Labs  Lab 12/06/22 1657 12/06/22 2044 12/07/22 0038 12/07/22 0335 12/07/22 0814  GLUCAP 176* 171* 156* 156* 177*     Recent Results (from the past 240 hour(s))  Resp panel by RT-PCR (RSV, Flu A&B, Covid) Anterior Nasal Swab     Status: None   Collection Time: 12/03/22  6:55 PM   Specimen: Anterior Nasal Swab  Result Value Ref Range Status   SARS Coronavirus 2 by RT PCR NEGATIVE NEGATIVE Final   Influenza A by PCR NEGATIVE NEGATIVE Final   Influenza B by PCR NEGATIVE NEGATIVE Final    Comment: (NOTE) The Xpert Xpress SARS-CoV-2/FLU/RSV plus assay is intended as an aid in the diagnosis of influenza from Nasopharyngeal swab specimens and should not be used as a sole basis for treatment. Nasal  washings and aspirates are unacceptable for Xpert Xpress SARS-CoV-2/FLU/RSV testing.  Fact Sheet for Patients: BloggerCourse.com  Fact Sheet for Healthcare Providers: SeriousBroker.it  This test is not yet approved or cleared by the [place] FDA and has been authorized for detection and/or diagnosis of SARS-CoV-2 by FDA under an Emergency Use Authorization (EUA). This EUA will remain in effect (meaning this test can be used) for the duration of the COVID-19 declaration under Section 564(b)(1) of the Act, 21 U.S.C. section 360bbb-3(b)(1), unless the authorization is terminated or revoked.     Resp Syncytial Virus by PCR NEGATIVE NEGATIVE Final    Comment: (NOTE) Fact Sheet for Patients: BloggerCourse.com  Fact Sheet for Healthcare Providers: SeriousBroker.it  This test is not yet approved or cleared by the [place] FDA and has been authorized for detection and/or diagnosis of SARS-CoV-2 by FDA under an Emergency Use Authorization (EUA). This EUA will remain in effect (meaning this test can be used) for the duration of the COVID-19 declaration under Section 564(b)(1) of the Act, 21 U.S.C. section 360bbb-3(b)(1), unless the authorization is terminated or revoked.  Performed at [facility], [address] [place], [place] [address]   Blood Culture (routine x 2)     Status: None   Collection Time: 12/03/22  6:59 PM   Specimen: BLOOD  Result Value Ref Range Status   Specimen Description BLOOD RIGHT ANTECUBITAL  Final   Special Requests   Final    BOTTLES DRAWN AEROBIC AND ANAEROBIC Blood Culture adequate volume   Culture  Setup Time   Final    GRAM NEGATIVE RODS IN BOTH AEROBIC AND ANAEROBIC BOTTLES CRITICAL VALUE NOTED.  VALUE IS CONSISTENT WITH PREVIOUSLY REPORTED AND CALLED VALUE.    Culture   Final    GRAM NEGATIVE RODS SUSCEPTIBILITIES PERFORMED ON  PREVIOUS CULTURE WITHIN THE LAST 5 DAYS. Performed at [facility], [address] [place], [place] [address]    Report Status 12/07/2022 FINAL  Final  Blood Culture (routine x 2)     Status: Abnormal   Collection Time: 12/03/22  7:08 PM   Specimen: BLOOD RIGHT FOREARM  Result Value Ref Range Status   Specimen Description BLOOD RIGHT FOREARM  Final   Special Requests   Final    BOTTLES DRAWN AEROBIC AND ANAEROBIC Blood Culture results may not be optimal due to an excessive volume of blood received in culture bottles   Culture  Setup Time  Final    GRAM NEGATIVE RODS IN BOTH AEROBIC AND ANAEROBIC BOTTLES CRITICAL RESULT CALLED TO, READ BACK BY AND VERIFIED WITH: PHARMD J LEDFORD 12/04/22 @ 0714 BY AB Performed at [facility], [address], [place], [place] [address]    Culture KLEBSIELLA OXYTOCA ESCHERICHIA COLI  (A)  Final   Report Status 12/07/2022 FINAL  Final   Organism ID, Bacteria KLEBSIELLA OXYTOCA  Final   Organism ID, Bacteria KLEBSIELLA OXYTOCA  Final   Organism ID, Bacteria ESCHERICHIA COLI  Final   Organism ID, Bacteria ESCHERICHIA COLI  Final      Susceptibility   Escherichia coli - KIRBY BAUER*    CEFAZOLIN INTERMEDIATE Intermediate    Escherichia coli - MIC*    AMPICILLIN 8 SENSITIVE Sensitive     CEFEPIME <=0.12 SENSITIVE Sensitive     CEFTAZIDIME <=1 SENSITIVE Sensitive     CEFTRIAXONE <=0.25 SENSITIVE Sensitive     CIPROFLOXACIN <=0.25 SENSITIVE Sensitive     GENTAMICIN <=1 SENSITIVE Sensitive     IMIPENEM <=0.25 SENSITIVE Sensitive     TRIMETH/SULFA <=20 SENSITIVE Sensitive     AMPICILLIN/SULBACTAM 4 SENSITIVE Sensitive     PIP/TAZO <=4 SENSITIVE Sensitive ug/mL    * ESCHERICHIA COLI    ESCHERICHIA COLI   Klebsiella oxytoca - KIRBY BAUER*    CEFAZOLIN INTERMEDIATE Intermediate    Klebsiella oxytoca - MIC*    AMPICILLIN >=32 RESISTANT Resistant     CEFEPIME <=0.12 SENSITIVE Sensitive     CEFTAZIDIME <=1 SENSITIVE Sensitive      CEFTRIAXONE <=0.25 SENSITIVE Sensitive     CIPROFLOXACIN <=0.25 SENSITIVE Sensitive     GENTAMICIN <=1 SENSITIVE Sensitive     IMIPENEM <=0.25 SENSITIVE Sensitive     TRIMETH/SULFA <=20 SENSITIVE Sensitive     AMPICILLIN/SULBACTAM 8 SENSITIVE Sensitive     PIP/TAZO <=4 SENSITIVE Sensitive ug/mL    * KLEBSIELLA OXYTOCA    KLEBSIELLA OXYTOCA  Blood Culture ID Panel (Reflexed)     Status: Abnormal   Collection Time: 12/03/22  7:08 PM  Result Value Ref Range Status   Enterococcus faecalis NOT DETECTED NOT DETECTED Final   Enterococcus Faecium NOT DETECTED NOT DETECTED Final   Listeria monocytogenes NOT DETECTED NOT DETECTED Final   Staphylococcus species NOT DETECTED NOT DETECTED Final   Staphylococcus aureus (BCID) NOT DETECTED NOT DETECTED Final   Staphylococcus epidermidis NOT DETECTED NOT DETECTED Final   Staphylococcus lugdunensis NOT DETECTED NOT DETECTED Final   Streptococcus species NOT DETECTED NOT DETECTED Final   Streptococcus agalactiae NOT DETECTED NOT DETECTED Final   Streptococcus pneumoniae NOT DETECTED NOT DETECTED Final   Streptococcus pyogenes NOT DETECTED NOT DETECTED Final   A.calcoaceticus-baumannii NOT DETECTED NOT DETECTED Final   Bacteroides fragilis NOT DETECTED NOT DETECTED Final   Enterobacterales DETECTED (A) NOT DETECTED Final    Comment: CRITICAL RESULT CALLED TO, READ BACK BY AND VERIFIED WITH: PHARMD J LEDFORD 12/04/22 @ 0714 BY AB    Enterobacter cloacae complex NOT DETECTED NOT DETECTED Final   Escherichia coli DETECTED (A) NOT DETECTED Final    Comment: CRITICAL RESULT CALLED TO, READ BACK BY AND VERIFIED WITH: PHARMD J LEDFORD 12/04/22 @ 0714 BY AB    Klebsiella aerogenes NOT DETECTED NOT DETECTED Final   Klebsiella oxytoca DETECTED (A) NOT DETECTED Final    Comment: CRITICAL RESULT CALLED TO, READ BACK BY AND VERIFIED WITH: PHARMD J LEDFORD 12/04/22 @ 0714 BY AB    Klebsiella pneumoniae NOT DETECTED NOT DETECTED Final   Proteus species NOT  DETECTED NOT DETECTED Final   Salmonella species NOT DETECTED NOT DETECTED Final   Serratia marcescens NOT DETECTED NOT DETECTED Final   Haemophilus influenzae NOT DETECTED NOT DETECTED Final   Neisseria meningitidis NOT DETECTED NOT DETECTED Final   Pseudomonas aeruginosa NOT DETECTED NOT DETECTED Final   Stenotrophomonas maltophilia NOT DETECTED NOT DETECTED Final   Candida albicans NOT DETECTED NOT DETECTED Final   Candida auris NOT DETECTED NOT DETECTED Final   Candida glabrata NOT DETECTED NOT DETECTED Final   Candida krusei NOT DETECTED NOT DETECTED Final   Candida parapsilosis NOT DETECTED NOT DETECTED Final   Candida tropicalis NOT DETECTED NOT DETECTED Final   Cryptococcus neoformans/gattii NOT DETECTED NOT DETECTED Final   CTX-M ESBL NOT DETECTED NOT DETECTED Final   Carbapenem resistance IMP NOT DETECTED NOT DETECTED Final   Carbapenem resistance KPC NOT DETECTED NOT DETECTED Final   Carbapenem resistance NDM NOT DETECTED NOT DETECTED Final   Carbapenem resist OXA 48 LIKE NOT DETECTED NOT DETECTED Final   Carbapenem resistance VIM NOT DETECTED NOT DETECTED Final    Comment: Performed at [facility], [address], [place], [place] [address]  MRSA Next Gen by PCR, Nasal     Status: None   Collection Time: 12/03/22 11:14 PM   Specimen: Nasal Mucosa; Nasal Swab  Result Value Ref Range Status   MRSA by PCR Next Gen NOT DETECTED NOT DETECTED Final    Comment: (NOTE) The GeneXpert MRSA Assay (FDA approved for NASAL specimens only), is one component of a comprehensive MRSA colonization surveillance program. It is not intended to diagnose MRSA infection nor to guide or monitor treatment for MRSA infections. Test performance is not FDA approved in patients less than 62 years old. Performed at [facility], [address] [place], [place] [address]          Radiology Studies: ECHOCARDIOGRAM COMPLETE  Result Date: 12/06/2022    ECHOCARDIOGRAM REPORT    Patient Name:   Patrick Kidd Date of Exam: 12/06/2022 Medical Rec #:  [id number]          Height:       75.0 in Accession #:    [id number]         Weight:       249.3 lb Date of Birth:  15-Mar-1945          BSA:          2.410 m Patient Age:    77 years           BP:           128/70 mmHg Patient Gender: M                  HR:           91 bpm. Exam Location:  Inpatient Procedure: 2D Echo Indications:    elevated troponin  History:        Patient has prior history of Echocardiogram examinations, most                 recent 01/29/2019. Chronic kidney disease, Arrythmias:WPW; Risk                 Factors:Diabetes, Hypertension and Dyslipidemia.  Sonographer:    Delcie Roch RDCS Referring Phys: 75 Adrieanna Boteler Teena Irani  Sonographer Comments: Image acquisition challenging due to uncooperative patient. IMPRESSIONS  1. Left ventricular ejection fraction, by estimation, is 55 to 60%. The left ventricle has normal function. The  left ventricle has no regional wall motion abnormalities. There is mild left ventricular hypertrophy. Left ventricular diastolic parameters are indeterminate.  2. Right ventricular systolic function is normal. The right ventricular size is mildly enlarged. There is moderately elevated pulmonary artery systolic pressure.  3. Left atrial size was mildly dilated.  4. The mitral valve is normal in structure. Trivial mitral valve regurgitation. No evidence of mitral stenosis.  5. The tricuspid valve is abnormal.  6. The aortic valve is tricuspid. Aortic valve regurgitation is not visualized. No aortic stenosis is present.  7. The inferior vena cava is dilated in size with <50% respiratory variability, suggesting right atrial pressure of 15 mmHg. FINDINGS  Left Ventricle: Left ventricular ejection fraction, by estimation, is 55 to 60%. The left ventricle has normal function. The left ventricle has no regional wall motion abnormalities. The left ventricular internal cavity size was normal in size.  There is  mild left ventricular hypertrophy. Left ventricular diastolic parameters are indeterminate. Right Ventricle: The right ventricular size is mildly enlarged. Right vetricular wall thickness was not well visualized. Right ventricular systolic function is normal. There is moderately elevated pulmonary artery systolic pressure. The tricuspid regurgitant velocity is 3.04 m/s, and with an assumed right atrial pressure of 15 mmHg, the estimated right ventricular systolic pressure is 52.0 mmHg. Left Atrium: Left atrial size was mildly dilated. Right Atrium: Right atrial size was normal in size. Pericardium: There is no evidence of pericardial effusion. Mitral Valve: The mitral valve is normal in structure. Trivial mitral valve regurgitation. No evidence of mitral valve stenosis. Tricuspid Valve: The tricuspid valve is abnormal. Tricuspid valve regurgitation is mild . No evidence of tricuspid stenosis. Aortic Valve: The aortic valve is tricuspid. Aortic valve regurgitation is not visualized. No aortic stenosis is present. Aortic valve mean gradient measures 4.9 mmHg. Aortic valve peak gradient measures 11.1 mmHg. Aortic valve area, by VTI measures 2.00  cm. Pulmonic Valve: The pulmonic valve was not well visualized. Pulmonic valve regurgitation is trivial. No evidence of pulmonic stenosis. Aorta: The aortic root and ascending aorta are structurally normal, with no evidence of dilitation. Venous: The inferior vena cava is dilated in size with less than 50% respiratory variability, suggesting right atrial pressure of 15 mmHg. IAS/Shunts: No atrial level shunt detected by color flow Doppler.  LEFT VENTRICLE PLAX 2D LVIDd:         5.10 cm      Diastology LVIDs:         3.40 cm      LV e' medial:    5.98 cm/s LV PW:         1.20 cm      LV E/e' medial:  13.4 LV IVS:        1.10 cm      LV e' lateral:   8.38 cm/s LVOT diam:     1.90 cm      LV E/e' lateral: 9.5 LV SV:         59 LV SV Index:   24 LVOT Area:     2.84 cm   LV Volumes (MOD) LV vol d, MOD A2C: 104.0 ml LV vol d, MOD A4C: 103.0 ml LV vol s, MOD A2C: 53.4 ml LV vol s, MOD A4C: 52.2 ml LV SV MOD A2C:     50.6 ml LV SV MOD A4C:     103.0 ml LV SV MOD BP:      51.1 ml RIGHT VENTRICLE  IVC RV Basal diam:  3.40 cm     IVC diam: 2.80 cm RV S prime:     14.70 cm/s TAPSE (M-mode): 1.8 cm LEFT ATRIUM           Index        RIGHT ATRIUM           Index LA diam:      3.50 cm 1.45 cm/m   RA Area:     20.80 cm LA Vol (A2C): 67.4 ml 27.97 ml/m  RA Volume:   65.20 ml  27.05 ml/m LA Vol (A4C): 95.6 ml 39.67 ml/m  AORTIC VALVE AV Area (Vmax):    1.84 cm AV Area (Vmean):   2.08 cm AV Area (VTI):     2.00 cm AV Vmax:           166.83 cm/s AV Vmean:          99.839 cm/s AV VTI:            0.295 m AV Peak Grad:      11.1 mmHg AV Mean Grad:      4.9 mmHg LVOT Vmax:         108.00 cm/s LVOT Vmean:        73.300 cm/s LVOT VTI:          0.208 m LVOT/AV VTI ratio: 0.70  AORTA Ao Root diam: 3.40 cm Ao Asc diam:  3.40 cm MITRAL VALVE               TRICUSPID VALVE MV Area (PHT): 4.80 cm    TR Peak grad:   37.0 mmHg MV Decel Time: 158 msec    TR Vmax:        304.00 cm/s MV E velocity: 79.90 cm/s MV A velocity: 81.10 cm/s  SHUNTS MV E/A ratio:  0.99        Systemic VTI:  0.21 m                            Systemic Diam: 1.90 cm Dina Rich MD Electronically signed by Dina Rich MD Signature Date/Time: 12/06/2022/3:27:17 PM    Final    DG CHEST PORT 1 VIEW  Result Date: 12/06/2022 CLINICAL DATA:  Dyspnea, cough EXAM: PORTABLE CHEST 1 VIEW COMPARISON:  12/04/2022 FINDINGS: Lung volumes are small. Developing right basilar opacity may represent increasing atelectasis or developing focal pulmonary infiltrate. No pneumothorax or pleural effusion. Cardiac size within normal limits. Pulmonary vascular crowding at the hila without overt pulmonary edema. No acute bone abnormality. IMPRESSION: 1. Pulmonary hypoinflation. 2. Developing right basilar opacity, atelectasis versus  developing focal pulmonary infiltrate. Electronically Signed   By: Helyn Numbers M.D.   On: 12/06/2022 03:17   DG Abd 1 View  Result Date: 12/06/2022 CLINICAL DATA:  Cough, abdominal pain EXAM: ABDOMEN - 1 VIEW COMPARISON:  05/30/2022 FINDINGS: Right flank is excluded from view. Visualized abdominal gas pattern is nonobstructive. No gross free intraperitoneal gas. Underpenetration precludes evaluation of the visceral shadows. Osseous structures are age-appropriate. IMPRESSION: 1. Nonobstructive abdominal gas pattern. Electronically Signed   By: Helyn Numbers M.D.   On: 12/06/2022 03:15   IR Perc Cholecystostomy  Result Date: 12/05/2022 INDICATION: 77 year old male with acute cholecystitis EXAM: IMAGE GUIDED PERCUTANEOUS CHOLECYSTOSTOMY DISCONTINUATION OF PERCUTANEOUS CHOLECYSTOSTOMY MEDICATIONS: None ANESTHESIA/SEDATION: Moderate (conscious) sedation was not employed during this procedure. A total of Versed 1.0 mg and Fentanyl 0 mcg was administered intravenously. Moderate Sedation Time: 0  minutes. The patient's level of consciousness and vital signs were monitored continuously by radiology nursing throughout the procedure under my direct supervision. FLUOROSCOPY TIME:  None COMPLICATIONS: None PROCEDURE: Informed written consent was obtained from the patient after a thorough discussion of the procedural risks, benefits and alternatives. All questions were addressed. Maximal Sterile Barrier Technique was utilized including caps, mask, sterile gowns, sterile gloves, sterile drape, hand hygiene and skin antiseptic. A timeout was performed prior to the initiation of the procedure. The patient was laid flat on the table under the image intensifier. Before prep and drape could occur, the patient began complaining of crossed a phobia and difficulty breathing. 1 mg of anxiolytic was administered, which did not help him. The patient sat up and I had a long conversation with the patient about the necessity of the  procedure. He refused to continue, essentially withdrawing his consent and we were unable to proceed. He would not continue with the procedure. We had no choice but to discontinue. IMPRESSION: Discontinued attempt at percutaneous cholecystostomy as above Electronically Signed   By: Gilmer Mor D.O.   On: 12/05/2022 17:16   NM Hepatobiliary Liver Func  Result Date: 12/05/2022 CLINICAL DATA:  Abdominal pain.  Cholelithiasis. EXAM: NUCLEAR MEDICINE HEPATOBILIARY IMAGING TECHNIQUE: Sequential images of the abdomen were obtained out to 60 minutes following intravenous administration of radiopharmaceutical. RADIOPHARMACEUTICALS:  5.0 mCi Tc-40m  Choletec IV COMPARISON:  CT 12/04/2022 FINDINGS: Patient image for 1 hour. Counts are read bili clear from the blood pool in thickened of the liver. Counts are evident in the small bowel by 20 minutes. The gallbladder fails to fill at 60 minutes. Patient was agitated and could not proceed further with imaging. IMPRESSION: Non filling of the gallbladder at 60 minutes could indicate cystic duct obstruction. Patient could not tolerate further imaging. Equivocal exam Electronically Signed   By: Genevive Bi M.D.   On: 12/05/2022 14:07        Scheduled Meds:  Chlorhexidine Gluconate Cloth  6 each Topical Daily   folic acid  1 mg Oral Daily   insulin aspart  0-15 Units Subcutaneous Q4H   multivitamin with minerals  1 tablet Oral Daily   pantoprazole (PROTONIX) IV  40 mg Intravenous Q12H   phosphorus  500 mg Oral BID   potassium chloride  40 mEq Oral Once   QUEtiapine  50 mg Oral QHS   thiamine  100 mg Oral Daily   Or   thiamine  100 mg Intravenous Daily   Continuous Infusions:  cefTRIAXone (ROCEPHIN)  IV 2 g (12/07/22 0859)   doxycycline (VIBRAMYCIN) IV 100 mg (12/07/22 0940)   sodium phosphate 30 mmol in dextrose 5 % 250 mL infusion 30 mmol (12/07/22 0921)     LOS: 4 days      Huey Bienenstock, MD [facility]   To contact the attending  provider between 7A-7P or the covering provider during after hours 7P-7A, please log into the web site [email or web address] and access using universal [place] password for that web site. If you do not have the password, please call the hospital operator.  12/07/2022, 12:14 PM

## 2022-12-08 ENCOUNTER — Inpatient Hospital Stay (HOSPITAL_COMMUNITY): Payer: Medicare Other

## 2022-12-08 DIAGNOSIS — A419 Sepsis, unspecified organism: Secondary | ICD-10-CM | POA: Diagnosis not present

## 2022-12-08 DIAGNOSIS — R6521 Severe sepsis with septic shock: Secondary | ICD-10-CM | POA: Diagnosis not present

## 2022-12-08 LAB — CBC
HCT: 33.2 % — ABNORMAL LOW (ref 39.0–52.0)
Hemoglobin: 11.4 g/dL — ABNORMAL LOW (ref 13.0–17.0)
MCH: 32.4 pg (ref 26.0–34.0)
MCHC: 34.3 g/dL (ref 30.0–36.0)
MCV: 94.3 fL (ref 80.0–100.0)
Platelets: 63 10*3/uL — ABNORMAL LOW (ref 150–400)
RBC: 3.52 MIL/uL — ABNORMAL LOW (ref 4.22–5.81)
RDW: 14.4 % (ref 11.5–15.5)
WBC: 6.8 10*3/uL (ref 4.0–10.5)
nRBC: 0 % (ref 0.0–0.2)

## 2022-12-08 LAB — GLUCOSE, CAPILLARY
Glucose-Capillary: 134 mg/dL — ABNORMAL HIGH (ref 70–99)
Glucose-Capillary: 136 mg/dL — ABNORMAL HIGH (ref 70–99)
Glucose-Capillary: 154 mg/dL — ABNORMAL HIGH (ref 70–99)
Glucose-Capillary: 157 mg/dL — ABNORMAL HIGH (ref 70–99)
Glucose-Capillary: 162 mg/dL — ABNORMAL HIGH (ref 70–99)
Glucose-Capillary: 170 mg/dL — ABNORMAL HIGH (ref 70–99)

## 2022-12-08 LAB — COMPREHENSIVE METABOLIC PANEL
ALT: 25 U/L (ref 0–44)
AST: 37 U/L (ref 15–41)
Albumin: 2.4 g/dL — ABNORMAL LOW (ref 3.5–5.0)
Alkaline Phosphatase: 65 U/L (ref 38–126)
Anion gap: 9 (ref 5–15)
BUN: 15 mg/dL (ref 8–23)
CO2: 21 mmol/L — ABNORMAL LOW (ref 22–32)
Calcium: 7.9 mg/dL — ABNORMAL LOW (ref 8.9–10.3)
Chloride: 109 mmol/L (ref 98–111)
Creatinine, Ser: 1.01 mg/dL (ref 0.61–1.24)
GFR, Estimated: >60 mL/min (ref >60)
Glucose, Bld: 155 mg/dL — ABNORMAL HIGH (ref 70–99)
Potassium: 3.5 mmol/L (ref 3.5–5.1)
Sodium: 139 mmol/L (ref 135–145)
Total Bilirubin: 1.2 mg/dL — ABNORMAL HIGH (ref <1.2)
Total Protein: 5.1 g/dL — ABNORMAL LOW (ref 6.5–8.1)

## 2022-12-08 LAB — PHOSPHORUS: Phosphorus: 3.7 mg/dL (ref 2.5–4.6)

## 2022-12-08 LAB — PROTIME-INR
INR: 1.1 (ref 0.8–1.2)
Prothrombin Time: 14.1 s (ref 11.4–15.2)

## 2022-12-08 LAB — BRAIN NATRIURETIC PEPTIDE: B Natriuretic Peptide: 623.7 pg/mL — ABNORMAL HIGH (ref 0.0–100.0)

## 2022-12-08 LAB — PROCALCITONIN: Procalcitonin: 6.51 ng/mL

## 2022-12-08 LAB — MAGNESIUM: Magnesium: 1.6 mg/dL — ABNORMAL LOW (ref 1.7–2.4)

## 2022-12-08 MED ORDER — MAGNESIUM SULFATE 2 GM/50ML IV SOLN
2.0000 g | Freq: Once | INTRAVENOUS | Status: AC
  Administered 2022-12-08: 2 g via INTRAVENOUS
  Filled 2022-12-08: qty 50

## 2022-12-08 MED ORDER — HYDRALAZINE HCL 25 MG PO TABS
25.0000 mg | ORAL_TABLET | Freq: Four times a day (QID) | ORAL | Status: DC
  Administered 2022-12-08 – 2022-12-10 (×9): 25 mg via ORAL
  Filled 2022-12-08 (×9): qty 1

## 2022-12-08 MED ORDER — METRONIDAZOLE 500 MG/100ML IV SOLN
500.0000 mg | Freq: Two times a day (BID) | INTRAVENOUS | Status: DC
  Administered 2022-12-08 – 2022-12-09 (×4): 500 mg via INTRAVENOUS
  Filled 2022-12-08 (×4): qty 100

## 2022-12-08 MED ORDER — ALPRAZOLAM 0.5 MG PO TABS
0.5000 mg | ORAL_TABLET | Freq: Once | ORAL | Status: AC
  Administered 2022-12-08: 0.5 mg via ORAL
  Filled 2022-12-08: qty 1

## 2022-12-08 MED ORDER — POTASSIUM CHLORIDE 10 MEQ/100ML IV SOLN
10.0000 meq | INTRAVENOUS | Status: AC
  Administered 2022-12-08 (×2): 10 meq via INTRAVENOUS
  Filled 2022-12-08 (×2): qty 100

## 2022-12-08 MED ORDER — HYDRALAZINE HCL 20 MG/ML IJ SOLN: 5.0000 mg | INTRAMUSCULAR | Status: DC | PRN

## 2022-12-08 NOTE — Progress Notes (Signed)
Occupational Therapy Treatment Patient Details Name: Patrick Kidd MRN: [id number] DOB: 1945-11-11 Today's Date: 12/08/2022   History of present illness 77 y.o. male presents to [facility] hospital after his wife brought him back due to worsening chills and RUQ pain to which he reports was improving.  PMH includes alcoholic cirrhosis, CLL, diabetes, HTN, peripheral neuropathy, Wolff-Parkinson-White syndrome, prostate CA.   OT comments  Patient received in supine and eager to get OOB. Patient instructed on rail use and min assist to get to EOB. Patient required cues for hand placement and safety with transfer from EOB to recliner. Patient able to perform grooming with setup and initiation cues. Patient will benefit from intensive inpatient follow up therapy, >3 hours/day to continue to address grooming, bed mobility, and self care. Acute OT to continue to follow.       If plan is discharge home, recommend the following:  A little help with walking and/or transfers;A little help with bathing/dressing/bathroom;Assistance with cooking/housework   Equipment Recommendations  None recommended by OT    Recommendations for Other Services      Precautions / Restrictions Precautions Precaution Comments: CiWA Restrictions Weight Bearing Restrictions: No       Mobility Bed Mobility Overal bed mobility: Needs Assistance Bed Mobility: Supine to Sit     Supine to sit: Min assist, HOB elevated, Used rails     General bed mobility comments: cues to use rail and technique with assistance for trunk    Transfers Overall transfer level: Needs assistance Equipment used: Rolling walker (2 wheels) Transfers: Sit to/from Stand, Bed to chair/wheelchair/BSC Sit to Stand: Min assist     Step pivot transfers: Min assist     General transfer comment: verbal and tactile cues for hand placement and min assist     Balance Overall balance assessment: Needs assistance Sitting-balance support: No upper  extremity supported, Feet supported Sitting balance-Leahy Scale: Fair Sitting balance - Comments: sitting EOB   Standing balance support: During functional activity Standing balance-Leahy Scale: Poor Standing balance comment: reliant on BUE support                           ADL either performed or assessed with clinical judgement   ADL Overall ADL's : Needs assistance/impaired     Grooming: Wash/dry hands;Wash/dry face;Oral care;Set up;Sitting                   Toilet Transfer: Minimal assistance;Rolling walker (2 wheels) Toilet Transfer Details (indicate cue type and reason): simulated                Extremity/Trunk Assessment              Vision       Perception     Praxis      Cognition Arousal: Alert Behavior During Therapy: WFL for tasks assessed/performed Overall Cognitive Status: Impaired/Different from baseline Area of Impairment: Attention, Memory, Safety/judgement                 Orientation Level: Disoriented to, Time, Situation Current Attention Level: Focused Memory: Decreased short-term memory, Decreased recall of precautions Following Commands: Follows one step commands with increased time Safety/Judgement: Decreased awareness of deficits Awareness: Intellectual Problem Solving: Decreased initiation, Difficulty sequencing, Slow processing, Requires verbal cues, Requires tactile cues General Comments: alert but requiring cues to perform tasks and for safety        Exercises      Shoulder Instructions  General Comments      Pertinent Vitals/ Pain       Pain Assessment Pain Assessment: No/denies pain Faces Pain Scale: No hurt  Home Living                                          Prior Functioning/Environment              Frequency  Min 1X/week        Progress Toward Goals  OT Goals(current goals can now be found in the care plan section)  Progress towards OT goals:  Progressing toward goals  Acute Rehab OT Goals Patient Stated Goal: get out of bed OT Goal Formulation: With patient/family Time For Goal Achievement: 12/20/22 Potential to Achieve Goals: Good ADL Goals Pt Will Perform Grooming: Independently;standing Pt Will Perform Lower Body Bathing: Independently;sit to/from stand Pt Will Perform Upper Body Dressing: Independently;standing Pt Will Perform Lower Body Dressing: Independently;sit to/from stand Pt Will Transfer to Toilet: Independently;ambulating Additional ADL Goal #1: Pt will complete a 3 step household ADL in proper sequence with Supervision  Plan      Co-evaluation                 AM-PAC OT "6 Clicks" Daily Activity     Outcome Measure   Help from another person eating meals?: A Little Help from another person taking care of personal grooming?: A Little Help from another person toileting, which includes using toliet, bedpan, or urinal?: A Little Help from another person bathing (including washing, rinsing, drying)?: A Little Help from another person to put on and taking off regular upper body clothing?: A Little Help from another person to put on and taking off regular lower body clothing?: A Lot 6 Click Score: 17    End of Session Equipment Utilized During Treatment: Gait belt;Rolling walker (2 wheels)  OT Visit Diagnosis: Unsteadiness on feet (R26.81);Other abnormalities of gait and mobility (R26.89);Other symptoms and signs involving cognitive function   Activity Tolerance Patient tolerated treatment well   Patient Left in chair;with call bell/phone within reach;with chair alarm set;with nursing/sitter in room;with family/visitor present   Nurse Communication Mobility status        Time: 3086-5784 OT Time Calculation (min): 18 min  Charges: OT General Charges $OT Visit: 1 Visit OT Treatments $Self Care/Home Management : 8-22 mins  Alfonse Flavors, OTA Acute Rehabilitation Services  Office  [phone]   Dewain Penning 12/08/2022, 2:17 PM

## 2022-12-08 NOTE — Progress Notes (Signed)
Inpatient Rehab Admissions Coordinator Note:   Per updated OT recommendations patient was screened for CIR candidacy by Stephania Fragmin, PT. At this time, pt appears to be a potential candidate for CIR. I will place an order for rehab consult for full assessment, per our protocol.  Please contact me any with questions.Estill Dooms, PT, DPT [phone] 12/08/22 3:47 PM

## 2022-12-08 NOTE — Progress Notes (Signed)
PROGRESS NOTE    Patrick Kidd  QMV:[id number] DOB: 02/11/1945 DOA: 12/03/2022 PCP: Etta Grandchild, MD   Chief Complaint  Patient presents with   Fever    Fever chills and diaphoresis onset around 1600 today. Pt was seen at [facility] yesterday. Hx of cirrhosis of the liver and gallstones. Abd distention noted. Low grade fever pta 100.4. EMS reports pt HR 150 pta and sating 83 percent on RA. Pt given bolus and placed on 3L [place]. Pt systolic 90s to 100s and tachypneic in the 30s pta.    Brief Narrative:   Patrick Kidd is a 77 y.o. male who has a PMH as below including but not limited to EtOH cirrhosis well controlled and no longer drinking, PUD requiring cautery in the past, DM, cholecystitis. He had admission back in April 2024 for acute calculous cholecystitis and E.coli bacteremia. He was deemed to be at increased risk for surgery; therefore, he was treated conservatively with abx with outpatient GI follow up. He did well following that admission with no issues.   On 10/29, he began to have RUQ pain that prompted an ED visit to [facility]. He felt improved with nausea meds, pain meds, GI cocktail and refused imaging at the time and asked to be discharged home.   On 10/30, he was doing well besides some pain. He called Dr. Chiquita Loth with GI for an Rx for pain pills that could get him through until his follow up appointment which was scheduled for 10/31. Around 1600 he began to have chills. These got worse so he came back to ED.   In ED, he was hypotensive and shocky. He received 3L NS with some improvement in BP during 3rd liter. He also had AKI, lactic acidosis, leukopenia, thrombocytopenia. CT A/P demonstrated acute cholecystitis. Given his constellation of lab abnormalities and concern that his pressure would deteriorate further, patient was admitted to ICU, his blood pressure has improved, general surgery has been consulted, recommendation for percutaneous cholecystostomy drain insertion for  which IR has been consulted, unfortunately percutaneous cholecystostomy drain by IR unsuccessful due to noncompliance during procedure.    Assessment & Plan:   Principal Problem:   Septic shock (HCC)    Septic shock due to acute calculus cholecystitis with E. coli and Klebsiella bacteremia  -Patient high risk for laparoscopic cholecystectomy, general surgery recommend to proceed with percutaneous drain  -Unsuccessful percutaneous cholecystostomy 11/1 secondary to medication noncompliance during procedures, confusion -IR with greatly appreciated, plan for percutaneous cholecystostomy drain with general anesthesia today -Discussed with wife at bedside today, risks and benefits, given patient cirrhosis, is at increased risk for anesthesia, risks and benefits discussed with wife and son, and both are agreeing to proceed -With IV Rocephin 2 g daily, continue with IV agile. -No further requirement of vasopressors, weaned off morning 10-31  Acute metabolic encephalopathy  Hospital delirium  -Patient is high risk of delirium given his liver disease, sepsis  -He is with significant agitation and restlessness, none cooperative with procedure earlier. -Wife denies any alcohol abuse for few years  -Continue with as needed Haldol for agitation, will increase his Seroquel and give earlier at bedtime as it does appear hospital delirium significantly contributing to his confusion. -Continue with IV fluids  acute kidney injury:  -Suspect ATN in setting of sepsis, hypovolemia possible now adequate fluid resuscitated.  No hydronephrosis on CT abdomen pelvis. -Continue with IV fluids and avoid nephrotoxic medications   Diabetes with hyperglycemia:  - on 50 units long  acting at home -- SSI --carb modified once taking PO  Hypophosphatemia Hypokalemia Hypomagnesemia - repleted   Thrombocytopenia:  -In setting of CLL, and liver cirrhosis worsened with consumption of sepsis -- SCDs, hold  chemoprophylaxis until it is greater than 50 -- will transfuse if invasive procedures (perc chole tube) are recommended   Cirrhosis: Child Pugh A (6 points) --well compensated, feel would tolerate procedures  Elevated troponins -Were elevated in the setting of septic shock, he never had complaints of chest pain, troponins are trending down, non-ACS pattern, 2D echo was obtained, it is reassuring, it is with a preserved EF, no regional wall motion abnormalities, and no significant valvular disease.          DVT prophylaxis: SCD Code Status: Full Family Communication: D/W wife at bedside, and son by phone Disposition:   Status is: Inpatient    Consultants:  PCCM General surgery Interventional radiology  Subjective:  Patient with agitation, confusion overnight, he did require a bedside sitter.  Objective: Vitals:   12/08/22 0725 12/08/22 0800 12/08/22 1141 12/08/22 1200  BP:  (!) 157/78 (!) 152/75 (!) 142/60  Pulse: 93 92 98 85  Resp: (!) 91     Temp:    98.7 F (37.1 C)  TempSrc:    Oral  SpO2:  93% 90% 91%  Weight:      Height:        Intake/Output Summary (Last 24 hours) at 12/08/2022 1306 Last data filed at 12/08/2022 0420 Gross per 24 hour  Intake 1133.14 ml  Output 500 ml  Net 633.14 ml   Filed Weights   12/04/22 0356 12/06/22 1056 12/08/22 0630  Weight: 113.1 kg 113.1 kg 110.3 kg    Examination:  Awake Alert, coherent and conversant today Symmetrical Chest wall movement, Good air movement bilaterally, CTAB RRR,No Gallops,Rubs or new Murmurs, No Parasternal Heave +ve B.Sounds, Abd Soft, mild right upper quadrant tenderness, No rebound - guarding or rigidity. No Cyanosis, Clubbing or edema, No new Rash or bruise      Data Reviewed: I have personally reviewed following labs and imaging studies  CBC: Recent Labs  Lab 12/02/22 0446 12/03/22 1850 12/04/22 0031 12/05/22 1301 12/06/22 0443 12/07/22 0344 12/08/22 0458  WBC 7.6 2.9* 9.5 14.7*  12.5* 7.5 6.8  NEUTROABS 4.3 1.9  --   --  10.9*  --   --   HGB 15.6 14.7 12.6* 12.9* 11.7* 11.1* 11.4*  HCT 44.6 41.8 36.4* 37.4* 33.9* 32.2* 33.2*  MCV 94.5 95.9 94.3 96.6 93.9 94.2 94.3  PLT 84* 58* 41* 42* 47* 49* 63*    Basic Metabolic Panel: Recent Labs  Lab 12/04/22 0031 12/05/22 1124 12/06/22 0443 12/07/22 0344 12/08/22 0458  NA 134* 134* 138 139 139  K 3.6 4.5 4.1 3.4* 3.5  CL 102 104 107 108 109  CO2 19* 16* 21* 21* 21*  GLUCOSE 153* 107* 146* 172* 155*  BUN 31* 46* 35* 24* 15  CREATININE 1.90* 1.71* 1.37* 1.12 1.01  CALCIUM 7.8* 7.7* 7.9* 8.0* 7.9*  MG 1.6* 2.4  --   --  1.6*  PHOS 2.1* 3.0  --  1.7* 3.7    GFR: Estimated Creatinine Clearance: 80.8 mL/min (by C-G formula based on SCr of 1.01 mg/dL).  Liver Function Tests: Recent Labs  Lab 12/03/22 1850 12/05/22 1124 12/06/22 0443 12/07/22 0344 12/08/22 0458  AST 36 103* 94* 54* 37  ALT 22 28 31 27 25   ALKPHOS 208* 57 57 67 65  BILITOT 2.7* 1.3*  1.4* 1.3* 1.2*  PROT 5.5* 5.4* 5.5* 5.0* 5.1*  ALBUMIN 3.0* 2.7* 2.6* 2.4* 2.4*    CBG: Recent Labs  Lab 12/07/22 2059 12/07/22 2320 12/08/22 0415 12/08/22 0841 12/08/22 1227  GLUCAP 140* 146* 157* 136* 154*     Recent Results (from the past 240 hour(s))  Resp panel by RT-PCR (RSV, Flu A&B, Covid) Anterior Nasal Swab     Status: None   Collection Time: 12/03/22  6:55 PM   Specimen: Anterior Nasal Swab  Result Value Ref Range Status   SARS Coronavirus 2 by RT PCR NEGATIVE NEGATIVE Final   Influenza A by PCR NEGATIVE NEGATIVE Final   Influenza B by PCR NEGATIVE NEGATIVE Final    Comment: (NOTE) The Xpert Xpress SARS-CoV-2/FLU/RSV plus assay is intended as an aid in the diagnosis of influenza from Nasopharyngeal swab specimens and should not be used as a sole basis for treatment. Nasal washings and aspirates are unacceptable for Xpert Xpress SARS-CoV-2/FLU/RSV testing.  Fact Sheet for Patients: BloggerCourse.com  Fact  Sheet for Healthcare Providers: SeriousBroker.it  This test is not yet approved or cleared by the [place] FDA and has been authorized for detection and/or diagnosis of SARS-CoV-2 by FDA under an Emergency Use Authorization (EUA). This EUA will remain in effect (meaning this test can be used) for the duration of the COVID-19 declaration under Section 564(b)(1) of the Act, 21 U.S.C. section 360bbb-3(b)(1), unless the authorization is terminated or revoked.     Resp Syncytial Virus by PCR NEGATIVE NEGATIVE Final    Comment: (NOTE) Fact Sheet for Patients: BloggerCourse.com  Fact Sheet for Healthcare Providers: SeriousBroker.it  This test is not yet approved or cleared by the [place] FDA and has been authorized for detection and/or diagnosis of SARS-CoV-2 by FDA under an Emergency Use Authorization (EUA). This EUA will remain in effect (meaning this test can be used) for the duration of the COVID-19 declaration under Section 564(b)(1) of the Act, 21 U.S.C. section 360bbb-3(b)(1), unless the authorization is terminated or revoked.  Performed at [facility], [address], [place], [place] [address]   Blood Culture (routine x 2)     Status: None   Collection Time: 12/03/22  6:59 PM   Specimen: BLOOD  Result Value Ref Range Status   Specimen Description BLOOD RIGHT ANTECUBITAL  Final   Special Requests   Final    BOTTLES DRAWN AEROBIC AND ANAEROBIC Blood Culture adequate volume   Culture  Setup Time   Final    GRAM NEGATIVE RODS IN BOTH AEROBIC AND ANAEROBIC BOTTLES CRITICAL VALUE NOTED.  VALUE IS CONSISTENT WITH PREVIOUSLY REPORTED AND CALLED VALUE.    Culture   Final    GRAM NEGATIVE RODS SUSCEPTIBILITIES PERFORMED ON PREVIOUS CULTURE WITHIN THE LAST 5 DAYS. Performed at [facility], [address], [place], [place] [address]    Report Status 12/07/2022 FINAL  Final  Blood  Culture (routine x 2)     Status: Abnormal   Collection Time: 12/03/22  7:08 PM   Specimen: BLOOD RIGHT FOREARM  Result Value Ref Range Status   Specimen Description BLOOD RIGHT FOREARM  Final   Special Requests   Final    BOTTLES DRAWN AEROBIC AND ANAEROBIC Blood Culture results may not be optimal due to an excessive volume of blood received in culture bottles   Culture  Setup Time   Final    GRAM NEGATIVE RODS IN BOTH AEROBIC AND ANAEROBIC BOTTLES CRITICAL RESULT CALLED TO, READ BACK  BY AND VERIFIED WITH: PHARMD J LEDFORD 12/04/22 @ 0714 BY AB Performed at [facility], [address], [place], [place] [address]    Culture KLEBSIELLA OXYTOCA ESCHERICHIA COLI  (A)  Final   Report Status 12/07/2022 FINAL  Final   Organism ID, Bacteria KLEBSIELLA OXYTOCA  Final   Organism ID, Bacteria KLEBSIELLA OXYTOCA  Final   Organism ID, Bacteria ESCHERICHIA COLI  Final   Organism ID, Bacteria ESCHERICHIA COLI  Final      Susceptibility   Escherichia coli - KIRBY BAUER*    CEFAZOLIN INTERMEDIATE Intermediate    Escherichia coli - MIC*    AMPICILLIN 8 SENSITIVE Sensitive     CEFEPIME <=0.12 SENSITIVE Sensitive     CEFTAZIDIME <=1 SENSITIVE Sensitive     CEFTRIAXONE <=0.25 SENSITIVE Sensitive     CIPROFLOXACIN <=0.25 SENSITIVE Sensitive     GENTAMICIN <=1 SENSITIVE Sensitive     IMIPENEM <=0.25 SENSITIVE Sensitive     TRIMETH/SULFA <=20 SENSITIVE Sensitive     AMPICILLIN/SULBACTAM 4 SENSITIVE Sensitive     PIP/TAZO <=4 SENSITIVE Sensitive ug/mL    * ESCHERICHIA COLI    ESCHERICHIA COLI   Klebsiella oxytoca - KIRBY BAUER*    CEFAZOLIN INTERMEDIATE Intermediate    Klebsiella oxytoca - MIC*    AMPICILLIN >=32 RESISTANT Resistant     CEFEPIME <=0.12 SENSITIVE Sensitive     CEFTAZIDIME <=1 SENSITIVE Sensitive     CEFTRIAXONE <=0.25 SENSITIVE Sensitive     CIPROFLOXACIN <=0.25 SENSITIVE Sensitive     GENTAMICIN <=1 SENSITIVE Sensitive     IMIPENEM <=0.25 SENSITIVE Sensitive      TRIMETH/SULFA <=20 SENSITIVE Sensitive     AMPICILLIN/SULBACTAM 8 SENSITIVE Sensitive     PIP/TAZO <=4 SENSITIVE Sensitive ug/mL    * KLEBSIELLA OXYTOCA    KLEBSIELLA OXYTOCA  Blood Culture ID Panel (Reflexed)     Status: Abnormal   Collection Time: 12/03/22  7:08 PM  Result Value Ref Range Status   Enterococcus faecalis NOT DETECTED NOT DETECTED Final   Enterococcus Faecium NOT DETECTED NOT DETECTED Final   Listeria monocytogenes NOT DETECTED NOT DETECTED Final   Staphylococcus species NOT DETECTED NOT DETECTED Final   Staphylococcus aureus (BCID) NOT DETECTED NOT DETECTED Final   Staphylococcus epidermidis NOT DETECTED NOT DETECTED Final   Staphylococcus lugdunensis NOT DETECTED NOT DETECTED Final   Streptococcus species NOT DETECTED NOT DETECTED Final   Streptococcus agalactiae NOT DETECTED NOT DETECTED Final   Streptococcus pneumoniae NOT DETECTED NOT DETECTED Final   Streptococcus pyogenes NOT DETECTED NOT DETECTED Final   A.calcoaceticus-baumannii NOT DETECTED NOT DETECTED Final   Bacteroides fragilis NOT DETECTED NOT DETECTED Final   Enterobacterales DETECTED (A) NOT DETECTED Final    Comment: CRITICAL RESULT CALLED TO, READ BACK BY AND VERIFIED WITH: PHARMD J LEDFORD 12/04/22 @ 0714 BY AB    Enterobacter cloacae complex NOT DETECTED NOT DETECTED Final   Escherichia coli DETECTED (A) NOT DETECTED Final    Comment: CRITICAL RESULT CALLED TO, READ BACK BY AND VERIFIED WITH: PHARMD J LEDFORD 12/04/22 @ 0714 BY AB    Klebsiella aerogenes NOT DETECTED NOT DETECTED Final   Klebsiella oxytoca DETECTED (A) NOT DETECTED Final    Comment: CRITICAL RESULT CALLED TO, READ BACK BY AND VERIFIED WITH: PHARMD J LEDFORD 12/04/22 @ 0714 BY AB    Klebsiella pneumoniae NOT DETECTED NOT DETECTED Final   Proteus species NOT DETECTED NOT DETECTED Final   Salmonella species NOT DETECTED NOT DETECTED Final   Serratia marcescens NOT DETECTED  NOT DETECTED Final   Haemophilus influenzae NOT  DETECTED NOT DETECTED Final   Neisseria meningitidis NOT DETECTED NOT DETECTED Final   Pseudomonas aeruginosa NOT DETECTED NOT DETECTED Final   Stenotrophomonas maltophilia NOT DETECTED NOT DETECTED Final   Candida albicans NOT DETECTED NOT DETECTED Final   Candida auris NOT DETECTED NOT DETECTED Final   Candida glabrata NOT DETECTED NOT DETECTED Final   Candida krusei NOT DETECTED NOT DETECTED Final   Candida parapsilosis NOT DETECTED NOT DETECTED Final   Candida tropicalis NOT DETECTED NOT DETECTED Final   Cryptococcus neoformans/gattii NOT DETECTED NOT DETECTED Final   CTX-M ESBL NOT DETECTED NOT DETECTED Final   Carbapenem resistance IMP NOT DETECTED NOT DETECTED Final   Carbapenem resistance KPC NOT DETECTED NOT DETECTED Final   Carbapenem resistance NDM NOT DETECTED NOT DETECTED Final   Carbapenem resist OXA 48 LIKE NOT DETECTED NOT DETECTED Final   Carbapenem resistance VIM NOT DETECTED NOT DETECTED Final    Comment: Performed at [facility], [address] [place], [place] [address]  MRSA Next Gen by PCR, Nasal     Status: None   Collection Time: 12/03/22 11:14 PM   Specimen: Nasal Mucosa; Nasal Swab  Result Value Ref Range Status   MRSA by PCR Next Gen NOT DETECTED NOT DETECTED Final    Comment: (NOTE) The GeneXpert MRSA Assay (FDA approved for NASAL specimens only), is one component of a comprehensive MRSA colonization surveillance program. It is not intended to diagnose MRSA infection nor to guide or monitor treatment for MRSA infections. Test performance is not FDA approved in patients less than 38 years old. Performed at [facility], [address] [place], [place] [address]          Radiology Studies: ECHOCARDIOGRAM COMPLETE  Result Date: 12/06/2022    ECHOCARDIOGRAM REPORT   Patient Name:   VALDEZ BRANNAN Date of Exam: 12/06/2022 Medical Rec #:  [id number]          Height:       75.0 in Accession #:    [id number]         Weight:       249.3 lb  Date of Birth:  20-Oct-1945          BSA:          2.410 m Patient Age:    77 years           BP:           128/70 mmHg Patient Gender: M                  HR:           91 bpm. Exam Location:  Inpatient Procedure: 2D Echo Indications:    elevated troponin  History:        Patient has prior history of Echocardiogram examinations, most                 recent 01/29/2019. Chronic kidney disease, Arrythmias:WPW; Risk                 Factors:Diabetes, Hypertension and Dyslipidemia.  Sonographer:    Delcie Roch RDCS Referring Phys: 22 Fredderick Swanger Teena Irani  Sonographer Comments: Image acquisition challenging due to uncooperative patient. IMPRESSIONS  1. Left ventricular ejection fraction, by estimation, is 55 to 60%. The left ventricle has normal function. The left ventricle has no regional wall motion abnormalities. There is mild left ventricular hypertrophy. Left ventricular diastolic parameters  are indeterminate.  2. Right ventricular systolic function is normal. The right ventricular size is mildly enlarged. There is moderately elevated pulmonary artery systolic pressure.  3. Left atrial size was mildly dilated.  4. The mitral valve is normal in structure. Trivial mitral valve regurgitation. No evidence of mitral stenosis.  5. The tricuspid valve is abnormal.  6. The aortic valve is tricuspid. Aortic valve regurgitation is not visualized. No aortic stenosis is present.  7. The inferior vena cava is dilated in size with <50% respiratory variability, suggesting right atrial pressure of 15 mmHg. FINDINGS  Left Ventricle: Left ventricular ejection fraction, by estimation, is 55 to 60%. The left ventricle has normal function. The left ventricle has no regional wall motion abnormalities. The left ventricular internal cavity size was normal in size. There is  mild left ventricular hypertrophy. Left ventricular diastolic parameters are indeterminate. Right Ventricle: The right ventricular size is mildly enlarged. Right  vetricular wall thickness was not well visualized. Right ventricular systolic function is normal. There is moderately elevated pulmonary artery systolic pressure. The tricuspid regurgitant velocity is 3.04 m/s, and with an assumed right atrial pressure of 15 mmHg, the estimated right ventricular systolic pressure is 52.0 mmHg. Left Atrium: Left atrial size was mildly dilated. Right Atrium: Right atrial size was normal in size. Pericardium: There is no evidence of pericardial effusion. Mitral Valve: The mitral valve is normal in structure. Trivial mitral valve regurgitation. No evidence of mitral valve stenosis. Tricuspid Valve: The tricuspid valve is abnormal. Tricuspid valve regurgitation is mild . No evidence of tricuspid stenosis. Aortic Valve: The aortic valve is tricuspid. Aortic valve regurgitation is not visualized. No aortic stenosis is present. Aortic valve mean gradient measures 4.9 mmHg. Aortic valve peak gradient measures 11.1 mmHg. Aortic valve area, by VTI measures 2.00  cm. Pulmonic Valve: The pulmonic valve was not well visualized. Pulmonic valve regurgitation is trivial. No evidence of pulmonic stenosis. Aorta: The aortic root and ascending aorta are structurally normal, with no evidence of dilitation. Venous: The inferior vena cava is dilated in size with less than 50% respiratory variability, suggesting right atrial pressure of 15 mmHg. IAS/Shunts: No atrial level shunt detected by color flow Doppler.  LEFT VENTRICLE PLAX 2D LVIDd:         5.10 cm      Diastology LVIDs:         3.40 cm      LV e' medial:    5.98 cm/s LV PW:         1.20 cm      LV E/e' medial:  13.4 LV IVS:        1.10 cm      LV e' lateral:   8.38 cm/s LVOT diam:     1.90 cm      LV E/e' lateral: 9.5 LV SV:         59 LV SV Index:   24 LVOT Area:     2.84 cm  LV Volumes (MOD) LV vol d, MOD A2C: 104.0 ml LV vol d, MOD A4C: 103.0 ml LV vol s, MOD A2C: 53.4 ml LV vol s, MOD A4C: 52.2 ml LV SV MOD A2C:     50.6 ml LV SV MOD A4C:      103.0 ml LV SV MOD BP:      51.1 ml RIGHT VENTRICLE             IVC RV Basal diam:  3.40 cm     IVC diam:  2.80 cm RV S prime:     14.70 cm/s TAPSE (M-mode): 1.8 cm LEFT ATRIUM           Index        RIGHT ATRIUM           Index LA diam:      3.50 cm 1.45 cm/m   RA Area:     20.80 cm LA Vol (A2C): 67.4 ml 27.97 ml/m  RA Volume:   65.20 ml  27.05 ml/m LA Vol (A4C): 95.6 ml 39.67 ml/m  AORTIC VALVE AV Area (Vmax):    1.84 cm AV Area (Vmean):   2.08 cm AV Area (VTI):     2.00 cm AV Vmax:           166.83 cm/s AV Vmean:          99.839 cm/s AV VTI:            0.295 m AV Peak Grad:      11.1 mmHg AV Mean Grad:      4.9 mmHg LVOT Vmax:         108.00 cm/s LVOT Vmean:        73.300 cm/s LVOT VTI:          0.208 m LVOT/AV VTI ratio: 0.70  AORTA Ao Root diam: 3.40 cm Ao Asc diam:  3.40 cm MITRAL VALVE               TRICUSPID VALVE MV Area (PHT): 4.80 cm    TR Peak grad:   37.0 mmHg MV Decel Time: 158 msec    TR Vmax:        304.00 cm/s MV E velocity: 79.90 cm/s MV A velocity: 81.10 cm/s  SHUNTS MV E/A ratio:  0.99        Systemic VTI:  0.21 m                            Systemic Diam: 1.90 cm Dina Rich MD Electronically signed by Dina Rich MD Signature Date/Time: 12/06/2022/3:27:17 PM    Final         Scheduled Meds:  Chlorhexidine Gluconate Cloth  6 each Topical Daily   folic acid  1 mg Oral Daily   hydrALAZINE  25 mg Oral Q6H   insulin aspart  0-15 Units Subcutaneous Q4H   multivitamin with minerals  1 tablet Oral Daily   pantoprazole (PROTONIX) IV  40 mg Intravenous Q12H   QUEtiapine  100 mg Oral QHS   thiamine  100 mg Oral Daily   Or   thiamine  100 mg Intravenous Daily   Continuous Infusions:  cefTRIAXone (ROCEPHIN)  IV 2 g (12/08/22 1007)   dextrose 5 % and 0.45 % NaCl 75 mL/hr at 12/08/22 0438   metronidazole 500 mg (12/08/22 0704)     LOS: 5 days      Huey Bienenstock, MD [facility]   To contact the attending provider between 7A-7P or the covering provider  during after hours 7P-7A, please log into the web site [email or web address] and access using universal [place] password for that web site. If you do not have the password, please call the hospital operator.  12/08/2022, 1:06 PM

## 2022-12-08 NOTE — Progress Notes (Signed)
Subjective: Patient confused.  Knows it is 2024, but doesn't know where he is.  He says we are in [place].  Says he still has some central abdominal pain  ROS: See above, otherwise other systems negative  Objective: Vital signs in last 24 hours: Temp:  [97.4 F (36.3 C)-98.4 F (36.9 C)] 98.2 F (36.8 C) (11/04 0419) Pulse Rate:  [89-108] 89 (11/04 0419) Resp:  [18-91] 91 (11/04 0725) BP: (153-172)/(71-85) 166/71 (11/04 0419) SpO2:  [93 %-98 %] 95 % (11/04 0419) Weight:  [110.3 kg] 110.3 kg (11/04 0630) Last BM Date : 12/04/22  Intake/Output from previous day: 11/03 0701 - 11/04 0700 In: 1133.1 [I.V.:173.1; IV Piggyback:960] Out: 1400 [Urine:1400] Intake/Output this shift: No intake/output data recorded.  PE: Gen: confused, wants to be out of restraints Heart: regular Lungs: CTAB Abd: soft, tender in RUQ, but mild, obese, +BS Ext: BUE in restraints  Lab Results:  Recent Labs    12/07/22 0344 12/08/22 0458  WBC 7.5 6.8  HGB 11.1* 11.4*  HCT 32.2* 33.2*  PLT 49* 63*   BMET Recent Labs    12/07/22 0344 12/08/22 0458  NA 139 139  K 3.4* 3.5  CL 108 109  CO2 21* 21*  GLUCOSE 172* 155*  BUN 24* 15  CREATININE 1.12 1.01  CALCIUM 8.0* 7.9*   PT/INR No results for input(s): "LABPROT", "INR" in the last 72 hours. CMP     Component Value Date/Time   NA 139 12/08/2022 0458   K 3.5 12/08/2022 0458   CL 109 12/08/2022 0458   CO2 21 (L) 12/08/2022 0458   GLUCOSE 155 (H) 12/08/2022 0458   BUN 15 12/08/2022 0458   CREATININE 1.01 12/08/2022 0458   CREATININE 0.95 11/19/2022 1052   CREATININE 1.13 06/04/2010 1354   CALCIUM 7.9 (L) 12/08/2022 0458   PROT 5.1 (L) 12/08/2022 0458   ALBUMIN 2.4 (L) 12/08/2022 0458   AST 37 12/08/2022 0458   AST 21 11/19/2022 1052   ALT 25 12/08/2022 0458   ALT 15 11/19/2022 1052   ALKPHOS 65 12/08/2022 0458   BILITOT 1.2 (H) 12/08/2022 0458   BILITOT 1.6 (H) 11/19/2022 1052   GFRNONAA >60 12/08/2022 0458   GFRNONAA  >60 11/19/2022 1052   GFRAA >60 02/06/2019 0546   Lipase     Component Value Date/Time   LIPASE 41 12/02/2022 0446       Studies/Results: ECHOCARDIOGRAM COMPLETE  Result Date: 12/06/2022    ECHOCARDIOGRAM REPORT   Patient Name:   Patrick Kidd Date of Exam: 12/06/2022 Medical Rec #:  [id number]          Height:       75.0 in Accession #:    [id number]         Weight:       249.3 lb Date of Birth:  Jun 04, 1945          BSA:          2.410 m Patient Age:    77 years           BP:           128/70 mmHg Patient Gender: M                  HR:           91 bpm. Exam Location:  Inpatient Procedure: 2D Echo Indications:    elevated troponin  History:        Patient has prior  history of Echocardiogram examinations, most                 recent 01/29/2019. Chronic kidney disease, Arrythmias:WPW; Risk                 Factors:Diabetes, Hypertension and Dyslipidemia.  Sonographer:    Delcie Roch RDCS Referring Phys: 25 DAWOOD Teena Irani  Sonographer Comments: Image acquisition challenging due to uncooperative patient. IMPRESSIONS  1. Left ventricular ejection fraction, by estimation, is 55 to 60%. The left ventricle has normal function. The left ventricle has no regional wall motion abnormalities. There is mild left ventricular hypertrophy. Left ventricular diastolic parameters are indeterminate.  2. Right ventricular systolic function is normal. The right ventricular size is mildly enlarged. There is moderately elevated pulmonary artery systolic pressure.  3. Left atrial size was mildly dilated.  4. The mitral valve is normal in structure. Trivial mitral valve regurgitation. No evidence of mitral stenosis.  5. The tricuspid valve is abnormal.  6. The aortic valve is tricuspid. Aortic valve regurgitation is not visualized. No aortic stenosis is present.  7. The inferior vena cava is dilated in size with <50% respiratory variability, suggesting right atrial pressure of 15 mmHg. FINDINGS  Left Ventricle:  Left ventricular ejection fraction, by estimation, is 55 to 60%. The left ventricle has normal function. The left ventricle has no regional wall motion abnormalities. The left ventricular internal cavity size was normal in size. There is  mild left ventricular hypertrophy. Left ventricular diastolic parameters are indeterminate. Right Ventricle: The right ventricular size is mildly enlarged. Right vetricular wall thickness was not well visualized. Right ventricular systolic function is normal. There is moderately elevated pulmonary artery systolic pressure. The tricuspid regurgitant velocity is 3.04 m/s, and with an assumed right atrial pressure of 15 mmHg, the estimated right ventricular systolic pressure is 52.0 mmHg. Left Atrium: Left atrial size was mildly dilated. Right Atrium: Right atrial size was normal in size. Pericardium: There is no evidence of pericardial effusion. Mitral Valve: The mitral valve is normal in structure. Trivial mitral valve regurgitation. No evidence of mitral valve stenosis. Tricuspid Valve: The tricuspid valve is abnormal. Tricuspid valve regurgitation is mild . No evidence of tricuspid stenosis. Aortic Valve: The aortic valve is tricuspid. Aortic valve regurgitation is not visualized. No aortic stenosis is present. Aortic valve mean gradient measures 4.9 mmHg. Aortic valve peak gradient measures 11.1 mmHg. Aortic valve area, by VTI measures 2.00  cm. Pulmonic Valve: The pulmonic valve was not well visualized. Pulmonic valve regurgitation is trivial. No evidence of pulmonic stenosis. Aorta: The aortic root and ascending aorta are structurally normal, with no evidence of dilitation. Venous: The inferior vena cava is dilated in size with less than 50% respiratory variability, suggesting right atrial pressure of 15 mmHg. IAS/Shunts: No atrial level shunt detected by color flow Doppler.  LEFT VENTRICLE PLAX 2D LVIDd:         5.10 cm      Diastology LVIDs:         3.40 cm      LV e'  medial:    5.98 cm/s LV PW:         1.20 cm      LV E/e' medial:  13.4 LV IVS:        1.10 cm      LV e' lateral:   8.38 cm/s LVOT diam:     1.90 cm      LV E/e' lateral: 9.5 LV SV:  59 LV SV Index:   24 LVOT Area:     2.84 cm  LV Volumes (MOD) LV vol d, MOD A2C: 104.0 ml LV vol d, MOD A4C: 103.0 ml LV vol s, MOD A2C: 53.4 ml LV vol s, MOD A4C: 52.2 ml LV SV MOD A2C:     50.6 ml LV SV MOD A4C:     103.0 ml LV SV MOD BP:      51.1 ml RIGHT VENTRICLE             IVC RV Basal diam:  3.40 cm     IVC diam: 2.80 cm RV S prime:     14.70 cm/s TAPSE (M-mode): 1.8 cm LEFT ATRIUM           Index        RIGHT ATRIUM           Index LA diam:      3.50 cm 1.45 cm/m   RA Area:     20.80 cm LA Vol (A2C): 67.4 ml 27.97 ml/m  RA Volume:   65.20 ml  27.05 ml/m LA Vol (A4C): 95.6 ml 39.67 ml/m  AORTIC VALVE AV Area (Vmax):    1.84 cm AV Area (Vmean):   2.08 cm AV Area (VTI):     2.00 cm AV Vmax:           166.83 cm/s AV Vmean:          99.839 cm/s AV VTI:            0.295 m AV Peak Grad:      11.1 mmHg AV Mean Grad:      4.9 mmHg LVOT Vmax:         108.00 cm/s LVOT Vmean:        73.300 cm/s LVOT VTI:          0.208 m LVOT/AV VTI ratio: 0.70  AORTA Ao Root diam: 3.40 cm Ao Asc diam:  3.40 cm MITRAL VALVE               TRICUSPID VALVE MV Area (PHT): 4.80 cm    TR Peak grad:   37.0 mmHg MV Decel Time: 158 msec    TR Vmax:        304.00 cm/s MV E velocity: 79.90 cm/s MV A velocity: 81.10 cm/s  SHUNTS MV E/A ratio:  0.99        Systemic VTI:  0.21 m                            Systemic Diam: 1.90 cm Dina Rich MD Electronically signed by Dina Rich MD Signature Date/Time: 12/06/2022/3:27:17 PM    Final     Anti-infectives: Anti-infectives (From admission, onward)    Start     Dose/Rate Route Frequency Ordered Stop   12/08/22 0645  metroNIDAZOLE (FLAGYL) IVPB 500 mg        500 mg 100 mL/hr over 60 Minutes Intravenous Every 12 hours 12/08/22 0639     12/06/22 0800  doxycycline (VIBRAMYCIN) 100 mg in dextrose  5 % 250 mL IVPB  Status:  Discontinued        100 mg 125 mL/hr over 120 Minutes Intravenous 2 times daily 12/06/22 0620 12/08/22 0641   12/05/22 1145  cefTRIAXone (ROCEPHIN) 2 g in sodium chloride 0.9 % 100 mL IVPB        2 g 200 mL/hr over 30 Minutes Intravenous To Radiology  12/05/22 1059 12/05/22 1215   12/04/22 0945  cefTRIAXone (ROCEPHIN) 2 g in sodium chloride 0.9 % 100 mL IVPB        2 g 200 mL/hr over 30 Minutes Intravenous Every 24 hours 12/04/22 0849     12/04/22 0600  piperacillin-tazobactam (ZOSYN) IVPB 3.375 g  Status:  Discontinued        3.375 g 12.5 mL/hr over 240 Minutes Intravenous Every 8 hours 12/03/22 2231 12/04/22 0849   12/03/22 2231  vancomycin variable dose per unstable renal function (pharmacist dosing)  Status:  Discontinued         Does not apply See admin instructions 12/03/22 2231 12/04/22 0849   12/03/22 1900  ceFEPIme (MAXIPIME) 2 g in sodium chloride 0.9 % 100 mL IVPB        2 g 200 mL/hr over 30 Minutes Intravenous  Once 12/03/22 1849 12/03/22 2025   12/03/22 1900  metroNIDAZOLE (FLAGYL) IVPB 500 mg        500 mg 100 mL/hr over 60 Minutes Intravenous  Once 12/03/22 1849 12/03/22 2131   12/03/22 1900  vancomycin (VANCOCIN) IVPB 1000 mg/200 mL premix  Status:  Discontinued        1,000 mg 200 mL/hr over 60 Minutes Intravenous  Once 12/03/22 1849 12/03/22 1851   12/03/22 1900  vancomycin (VANCOCIN) 2,500 mg in sodium chloride 0.9 % 500 mL IVPB        2,500 mg 262.5 mL/hr over 120 Minutes Intravenous  Once 12/03/22 1853 12/03/22 2256        Assessment/Plan Acute calculous cholecystitis - WBC normal today and AF.  Pain is mild in RUQ. - scheduled for IR perc drain today with general anesthesia. - discussed with wife today at bedside regarding how to proceed.  He does overall seem improved with abx therapy.  Discussed the option of holding on perc chole drain and just treat with abx, but that doesn't completely guarantee he will continue to improve and  not need a drain moving forward.  We also discussed continuing to move forward with the perc chole drain as this will definitively get rid of his cholecystitis, but he is at risk for bleeding complications given his cirrhosis, thrombocytopenia, etc.  His confusion does put him at risk for pulling his perc chole drain which also has complication risks associated with this.  She wants to speak to IR about the procedure.  Anesthesia does carry risk of further confusion as well, but also the drain may help with some confusion from the gallbladder infection.  Wife would like to speak to her family as well.   - ordered a PT/INR, last one 5 days ago was 1.5. - cont abx therapy - cont to follow - discussed with primary service.  FEN - NPO for procedure today VTE - on hold due to thrombocytopenia, cirrhosis ID - rocephin/Flagyl  Cirrhosis CLL DM Portal HTN WPW syndrome  I reviewed Consultant IR notes, hospitalist notes, last 24 h vitals and pain scores, last 48 h intake and output, last 24 h labs and trends, and last 24 h imaging results.   LOS: 5 days    Letha Cape , [facility] Surgery 12/08/2022, 8:22 AM Please see Amion for pager number during day hours 7:00am-4:30pm or 7:00am -11:30am on weekends

## 2022-12-08 NOTE — TOC Initial Note (Signed)
Transition of Care [facility]) - Initial/Assessment Note    Patient Details  Name: Patrick Kidd MRN: [id number] Date of Birth: 1945-07-01  Transition of Care [facility]) CM/SW Contact:    Mearl Latin, LCSW Phone Number: 12/08/2022, 12:40 PM  Clinical Narrative:                 Patient transferred from ICU. TOC continuing to follow for needs. Currently not appropriate for ETOH assessment.     Barriers to Discharge: Continued Medical Work up   Patient Goals and CMS Choice            Expected Discharge Plan and Services In-house Referral: Clinical Social Work     Living arrangements for the past 2 months: Single Family Home                                      Prior Living Arrangements/Services Living arrangements for the past 2 months: Single Family Home Lives with:: Spouse Patient language and need for interpreter reviewed:: Yes        Need for Family Participation in Patient Care: Yes (Comment) Care giver support system in place?: Yes (comment)   Criminal Activity/Legal Involvement Pertinent to Current Situation/Hospitalization: No - Comment as needed  Activities of Daily Living   ADL Screening (condition at time of admission) Independently performs ADLs?: No Does the patient have a NEW difficulty with bathing/dressing/toileting/self-feeding that is expected to last >3 days?: Yes (Initiates electronic notice to provider for possible OT consult) Does the patient have a NEW difficulty with getting in/out of bed, walking, or climbing stairs that is expected to last >3 days?: Yes (Initiates electronic notice to provider for possible PT consult) Does the patient have a NEW difficulty with communication that is expected to last >3 days?: Yes (Initiates electronic notice to provider for possible SLP consult) Is the patient deaf or have difficulty hearing?: No Does the patient have difficulty seeing, even when wearing glasses/contacts?: No Does the patient have  difficulty concentrating, remembering, or making decisions?: Yes  Permission Sought/Granted                  Emotional Assessment   Attitude/Demeanor/Rapport: Unable to Assess Affect (typically observed): Unable to Assess Orientation: : Oriented to Self, Oriented to Place Alcohol / Substance Use: Alcohol Use Psych Involvement: No (comment)  Admission diagnosis:  Septic shock (HCC) [A41.9, R65.21] Patient Active Problem List   Diagnosis Date Noted   Septic shock (HCC) 12/03/2022   E coli bacteremia 06/01/2022   Sludge in gallbladder 05/30/2022   Hyperbilirubinemia 05/30/2022   Urinary incontinence 06/25/2021   Degenerative lumbar spinal stenosis 04/30/2021   Degenerative arthritis of knee, bilateral 03/27/2021   Chronic lymphocytic leukemia (HCC) 11/08/2020   Insulin-requiring or dependent type II diabetes mellitus (HCC) 09/25/2020   Stage 3a chronic kidney disease (HCC) 03/15/2020   Thiamine deficiency    Paroxysmal atrial fibrillation (HCC) 07/19/2019   Allergic rhinitis 06/09/2019   Erectile dysfunction due to diabetes mellitus (HCC) 06/09/2019   Overweight with body mass index (BMI) of 28 to 28.9 in adult 03/17/2019   WPW (Wolff-Parkinson-White syndrome) 02/05/2019   SBP (spontaneous bacterial peritonitis) (HCC)-December 2020,     Encephalopathy, hepatic (HCC)    Degenerative disc disease, cervical 07/21/2018   Cervical radiculopathy 07/16/2018   OAB (overactive bladder) 07/06/2018   Malignant neoplasm of prostate (HCC) 01/13/2018   Hyperlipidemia LDL goal <100 11/11/2017  Essential hypertension 11/11/2017   BPH associated with nocturia 11/11/2017   Erectile dysfunction due to arterial insufficiency 04/07/2017   Peripheral vascular disease (HCC) 06/25/2016   Alcoholic cirrhosis (HCC) 10/04/2015   Type 2 diabetes mellitus with complication, with long-term current use of insulin (HCC) 08/22/2015   Hypersomnolence 03/19/2014   Peripheral neuropathy 01/11/2013    Polyclonal gammopathy 01/11/2013   GERD 10/04/2009   PCP:  Etta Grandchild, MD Pharmacy:   [id number] - [place], [place] - [address] AT [facility] [address] [place] [address] Phone: [phone] Fax: [phone]  [facility] [address] [place] [address] Phone: [phone] Fax: [phone]     Social Determinants of Health (SDOH) Social History: SDOH Screenings   Food Insecurity: No Food Insecurity (12/06/2022)  Housing: Low Risk  (12/06/2022)  Transportation Needs: No Transportation Needs (12/06/2022)  Utilities: Not At Risk (12/06/2022)  Alcohol Screen: Low Risk  (12/02/2021)  Depression (PHQ2-9): Low Risk  (12/02/2021)  Financial Resource Strain: Low Risk  (12/02/2021)  Physical Activity: Inactive (12/02/2021)  Social Connections: Moderately Integrated (12/02/2021)  Stress: No Stress Concern Present (12/02/2021)  Tobacco Use: Medium Risk (12/05/2022)   SDOH Interventions:     Readmission Risk Interventions     No data to display

## 2022-12-08 NOTE — Anesthesia Preprocedure Evaluation (Signed)
Anesthesia Evaluation  Patient identified by MRN, date of birth, ID band Patient awake    Reviewed: Allergy & Precautions, NPO status , Patient's Chart, lab work & pertinent test results  Airway Mallampati: II  TM Distance: >3 FB Neck ROM: Full    Dental no notable dental hx.    Pulmonary sleep apnea , former smoker   Pulmonary exam normal        Cardiovascular hypertension, + Peripheral Vascular Disease   Rhythm:Regular Rate:Normal     Neuro/Psych negative neurological ROS  negative psych ROS   GI/Hepatic Neg liver ROS, PUD,GERD  ,,  Endo/Other  diabetes, Type 2, Insulin Dependent, Oral Hypoglycemic Agents    Renal/GU   negative genitourinary   Musculoskeletal  (+) Arthritis , Osteoarthritis,    Abdominal Normal abdominal exam  (+)   Peds  Hematology  (+) Blood dyscrasia, anemia Lab Results      Component                Value               Date                      WBC                      6.8                 12/08/2022                HGB                      11.4 (L)            12/08/2022                HCT                      33.2 (L)            12/08/2022                MCV                      94.3                12/08/2022                PLT                      63 (L)              12/08/2022              Anesthesia Other Findings   Reproductive/Obstetrics                             Anesthesia Physical Anesthesia Plan  ASA: 3  Anesthesia Plan: MAC   Post-op Pain Management: Tylenol PO (pre-op)*   Induction: Intravenous  PONV Risk Score and Plan: 2 and Ondansetron, Propofol infusion, Treatment may vary due to age or medical condition and Dexamethasone  Airway Management Planned: Simple Face Mask and Nasal Cannula  Additional Equipment: None  Intra-op Plan:   Post-operative Plan:   Informed Consent: I have reviewed the patients History and Physical, chart, labs and  discussed the procedure including the risks, benefits and alternatives for the proposed  anesthesia with the patient or authorized representative who has indicated his/her understanding and acceptance.     Dental advisory given  Plan Discussed with: CRNA  Anesthesia Plan Comments:        Anesthesia Quick Evaluation

## 2022-12-08 NOTE — Evaluation (Signed)
Physical Therapy Evaluation Patient Details Name: Patrick Kidd MRN: [id number] DOB: 1945-11-15 Today's Date: 12/08/2022  History of Present Illness  77 y.o. male presents to [facility] hospital after his wife brought him back due to worsening chills and RUQ pain to which he reports was improving. +cholecystitis; planning to put in a drain 12/08/22  PMH includes alcoholic cirrhosis, CLL, diabetes, HTN, peripheral neuropathy, Wolff-Parkinson-White syndrome, prostate CA.  Clinical Impression   Pt admitted secondary to problem above with deficits below. PTA patient was living with wife in one level home with one step to enter. He was no longer using a RW to ambulate after his back surgery. Pt currently requires min assist for bed mobility, transfers, and gait. Anticipate good progress and ability to return home with HHPT upon discharge.  Anticipate patient will benefit from PT to address problems listed below.Will continue to follow acutely to maximize functional mobility independence and safety.           If plan is discharge home, recommend the following: A little help with walking and/or transfers;Assistance with cooking/housework;Direct supervision/assist for medications management;Direct supervision/assist for financial management;Assist for transportation;Help with stairs or ramp for entrance;Supervision due to cognitive status   Can travel by private vehicle        Equipment Recommendations None recommended by PT  Recommendations for Other Services       Functional Status Assessment Patient has had a recent decline in their functional status and demonstrates the ability to make significant improvements in function in a reasonable and predictable amount of time.     Precautions / Restrictions Precautions Precautions: Fall Precaution Comments: CiWA Restrictions Weight Bearing Restrictions: No      Mobility  Bed Mobility Overal bed mobility: Needs Assistance Bed Mobility: Supine to  Sit, Sit to Supine     Supine to sit: Min assist, HOB elevated, Used rails Sit to supine: Contact guard assist   General bed mobility comments: used rails without cues and with assistance for trunk    Transfers Overall transfer level: Needs assistance Equipment used: Rolling walker (2 wheels) Transfers: Sit to/from Stand Sit to Stand: Min assist           General transfer comment: verbal and tactile cues for hand placement and min assist from both EOB and chair with armrests    Ambulation/Gait Ambulation/Gait assistance: Min assist, +2 safety/equipment Gait Distance (Feet): 70 Feet (seated rest, 20) Assistive device: Rolling walker (2 wheels) Gait Pattern/deviations: Step-through pattern, Decreased stride length, Trunk flexed   Gait velocity interpretation: 1.31 - 2.62 ft/sec, indicative of limited community ambulator   General Gait Details: pushes RW too far ahead and requires max cues to correct and sstand upright  Stairs            Wheelchair Mobility     Tilt Bed    Modified Rankin (Stroke Patients Only)       Balance Overall balance assessment: Needs assistance Sitting-balance support: No upper extremity supported, Feet supported Sitting balance-Leahy Scale: Fair Sitting balance - Comments: sitting EOB   Standing balance support: During functional activity Standing balance-Leahy Scale: Poor Standing balance comment: reliant on BUE support                             Pertinent Vitals/Pain Pain Assessment Pain Assessment: Faces Faces Pain Scale: Hurts little more Pain Location: low back Pain Descriptors / Indicators: Aching Pain Intervention(s): Limited activity within patient's tolerance, Monitored during session,  Repositioned    Home Living Family/patient expects to be discharged to:: Private residence Living Arrangements: Spouse/significant other Available Help at Discharge: Family;Available 24 hours/day Type of Home:  House Home Access: Stairs to enter Entrance Stairs-Rails: None Entrance Stairs-Number of Steps: 1   Home Layout: One level Home Equipment: Cane - single Librarian, academic (2 wheels);Wheelchair - manual Additional Comments: 1 fall in the last year    Prior Function Prior Level of Function : Independent/Modified Independent;Working/employed;Driving;History of Falls (last six months)             Mobility Comments: 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 [employer], had recently progressed to ambulating without a device since back surgery ADLs Comments: ind     Extremity/Trunk Assessment   Upper Extremity Assessment Upper Extremity Assessment: Defer to OT evaluation    Lower Extremity Assessment Lower Extremity Assessment: Generalized weakness    Cervical / Trunk Assessment Cervical / Trunk Assessment: Normal  Communication   Communication Communication: No apparent difficulties  Cognition Arousal: Alert Behavior During Therapy: WFL for tasks assessed/performed Overall Cognitive Status: Impaired/Different from baseline Area of Impairment: Attention, Memory, Safety/judgement                   Current Attention Level: Focused Memory: Decreased short-term memory, Decreased recall of precautions Following Commands: Follows one step commands with increased time Safety/Judgement: Decreased awareness of deficits Awareness: Intellectual Problem Solving: Decreased initiation, Difficulty sequencing, Slow processing, Requires verbal cues, Requires tactile cues General Comments: alert but requiring cues to complete/continue tasks and for safety        General Comments General comments (skin integrity, edema, etc.): VSS on room air (sats 90-91% while walking); returned to bed at pt's request (had just gotten back to bed from sitting up in chair on arrival). Some interruption in time/session due to xray in to take a chest xray while sitting and resting    Exercises     Assessment/Plan     PT Assessment Patient needs continued PT services  PT Problem List Decreased strength;Decreased activity tolerance;Decreased balance;Decreased mobility;Decreased cognition;Decreased knowledge of use of DME;Decreased knowledge of precautions;Cardiopulmonary status limiting activity;Obesity;Pain       PT Treatment Interventions DME instruction;Gait training;Stair training;Functional mobility training;Therapeutic activities;Therapeutic exercise;Balance training;Cognitive remediation;Patient/family education    PT Goals (Current goals can be found in the Care Plan section)  Acute Rehab PT Goals Patient Stated Goal: return to independence PT Goal Formulation: With patient Time For Goal Achievement: 12/22/22 Potential to Achieve Goals: Good    Frequency Min 1X/week     Co-evaluation               AM-PAC PT "6 Clicks" Mobility  Outcome Measure Help needed turning from your back to your side while in a flat bed without using bedrails?: A Little Help needed moving from lying on your back to sitting on the side of a flat bed without using bedrails?: A Little Help needed moving to and from a bed to a chair (including a wheelchair)?: A Little Help needed standing up from a chair using your arms (e.g., wheelchair or bedside chair)?: A Little Help needed to walk in hospital room?: A Little Help needed climbing 3-5 steps with a railing? : A Lot 6 Click Score: 17    End of Session Equipment Utilized During Treatment: Gait belt Activity Tolerance: Patient tolerated treatment well Patient left: in bed;with call bell/phone within reach;with bed alarm set;with nursing/sitter in room;with family/visitor present Nurse Communication: Mobility status PT Visit Diagnosis: Other abnormalities of  gait and mobility (R26.89);Muscle weakness (generalized) (M62.81)    Time: 9528-4132 PT Time Calculation (min) (ACUTE ONLY): 24 min   Charges:   PT Evaluation $PT Eval Low Complexity: 1 Low   PT  General Charges $$ ACUTE PT VISIT: 1 Visit          Jerolyn Center, PT Acute Rehabilitation Services  Office [phone]   Zena Amos 12/08/2022, 2:37 PM

## 2022-12-09 ENCOUNTER — Inpatient Hospital Stay (HOSPITAL_COMMUNITY): Payer: Medicare Other | Admitting: Certified Registered Nurse Anesthetist

## 2022-12-09 ENCOUNTER — Inpatient Hospital Stay (HOSPITAL_COMMUNITY): Payer: Self-pay | Admitting: Certified Registered Nurse Anesthetist

## 2022-12-09 ENCOUNTER — Inpatient Hospital Stay (HOSPITAL_COMMUNITY): Payer: Medicare Other

## 2022-12-09 ENCOUNTER — Encounter (HOSPITAL_COMMUNITY)
Admission: EM | Disposition: A | Discharge status: Home / Self Care | Payer: Self-pay | Source: Home / Self Care | Attending: Internal Medicine

## 2022-12-09 ENCOUNTER — Encounter (HOSPITAL_COMMUNITY): Payer: Self-pay

## 2022-12-09 DIAGNOSIS — K819 Cholecystitis, unspecified: Secondary | ICD-10-CM | POA: Diagnosis not present

## 2022-12-09 DIAGNOSIS — I10 Essential (primary) hypertension: Secondary | ICD-10-CM

## 2022-12-09 DIAGNOSIS — A419 Sepsis, unspecified organism: Secondary | ICD-10-CM | POA: Diagnosis not present

## 2022-12-09 DIAGNOSIS — R6521 Severe sepsis with septic shock: Secondary | ICD-10-CM | POA: Diagnosis not present

## 2022-12-09 HISTORY — PX: IR PERC CHOLECYSTOSTOMY: IMG2326

## 2022-12-09 HISTORY — PX: RADIOLOGY WITH ANESTHESIA: SHX6223

## 2022-12-09 LAB — COMPREHENSIVE METABOLIC PANEL
ALT: 23 U/L (ref 0–44)
AST: 25 U/L (ref 15–41)
Albumin: 2.4 g/dL — ABNORMAL LOW (ref 3.5–5.0)
Alkaline Phosphatase: 72 U/L (ref 38–126)
Anion gap: 9 (ref 5–15)
BUN: 13 mg/dL (ref 8–23)
CO2: 20 mmol/L — ABNORMAL LOW (ref 22–32)
Calcium: 7.9 mg/dL — ABNORMAL LOW (ref 8.9–10.3)
Chloride: 109 mmol/L (ref 98–111)
Creatinine, Ser: 0.97 mg/dL (ref 0.61–1.24)
GFR, Estimated: >60 mL/min (ref >60)
Glucose, Bld: 130 mg/dL — ABNORMAL HIGH (ref 70–99)
Potassium: 3.2 mmol/L — ABNORMAL LOW (ref 3.5–5.1)
Sodium: 138 mmol/L (ref 135–145)
Total Bilirubin: 1.3 mg/dL — ABNORMAL HIGH (ref <1.2)
Total Protein: 5.1 g/dL — ABNORMAL LOW (ref 6.5–8.1)

## 2022-12-09 LAB — CBC
HCT: 33.7 % — ABNORMAL LOW (ref 39.0–52.0)
Hemoglobin: 11.8 g/dL — ABNORMAL LOW (ref 13.0–17.0)
MCH: 32.6 pg (ref 26.0–34.0)
MCHC: 35 g/dL (ref 30.0–36.0)
MCV: 93.1 fL (ref 80.0–100.0)
Platelets: 90 10*3/uL — ABNORMAL LOW (ref 150–400)
RBC: 3.62 MIL/uL — ABNORMAL LOW (ref 4.22–5.81)
RDW: 13.9 % (ref 11.5–15.5)
WBC: 7 10*3/uL (ref 4.0–10.5)
nRBC: 0 % (ref 0.0–0.2)

## 2022-12-09 LAB — GLUCOSE, CAPILLARY
Glucose-Capillary: 101 mg/dL — ABNORMAL HIGH (ref 70–99)
Glucose-Capillary: 126 mg/dL — ABNORMAL HIGH (ref 70–99)
Glucose-Capillary: 139 mg/dL — ABNORMAL HIGH (ref 70–99)
Glucose-Capillary: 139 mg/dL — ABNORMAL HIGH (ref 70–99)
Glucose-Capillary: 143 mg/dL — ABNORMAL HIGH (ref 70–99)
Glucose-Capillary: 169 mg/dL — ABNORMAL HIGH (ref 70–99)

## 2022-12-09 LAB — PROTIME-INR
INR: 1.2 (ref 0.8–1.2)
Prothrombin Time: 15.1 s (ref 11.4–15.2)

## 2022-12-09 LAB — PHOSPHORUS: Phosphorus: 3.1 mg/dL (ref 2.5–4.6)

## 2022-12-09 LAB — MAGNESIUM: Magnesium: 1.9 mg/dL (ref 1.7–2.4)

## 2022-12-09 SURGERY — IR WITH ANESTHESIA
Anesthesia: Monitor Anesthesia Care

## 2022-12-09 MED ORDER — MAGNESIUM SULFATE 2 GM/50ML IV SOLN: 2.0000 g | Freq: Once | INTRAVENOUS | Status: DC

## 2022-12-09 MED ORDER — PROPOFOL 500 MG/50ML IV EMUL
INTRAVENOUS | Status: DC | PRN
  Administered 2022-12-09: 50 ug/kg/min via INTRAVENOUS

## 2022-12-09 MED ORDER — FENTANYL CITRATE (PF) 100 MCG/2ML IJ SOLN
INTRAMUSCULAR | Status: DC | PRN
  Administered 2022-12-09: 25 ug via INTRAVENOUS

## 2022-12-09 MED ORDER — SODIUM CHLORIDE 0.9 % IV SOLN
INTRAVENOUS | Status: AC
  Filled 2022-12-09: qty 20

## 2022-12-09 MED ORDER — POTASSIUM CHLORIDE CRYS ER 20 MEQ PO TBCR: 40.0000 meq | EXTENDED_RELEASE_TABLET | Freq: Once | ORAL | Status: DC

## 2022-12-09 MED ORDER — MORPHINE SULFATE (PF) 2 MG/ML IV SOLN
1.0000 mg | INTRAVENOUS | Status: DC | PRN
  Administered 2022-12-09: 2 mg via INTRAVENOUS
  Filled 2022-12-09: qty 1

## 2022-12-09 MED ORDER — SODIUM CHLORIDE 0.9 % IV SOLN: INTRAVENOUS | Status: DC | PRN

## 2022-12-09 MED ORDER — HALOPERIDOL LACTATE 5 MG/ML IJ SOLN
5.0000 mg | Freq: Once | INTRAMUSCULAR | Status: AC
  Administered 2022-12-09: 5 mg via INTRAVENOUS
  Filled 2022-12-09: qty 1

## 2022-12-09 MED ORDER — ACETAMINOPHEN 500 MG PO TABS
1000.0000 mg | ORAL_TABLET | Freq: Once | ORAL | Status: AC
  Administered 2022-12-09: 1000 mg via ORAL
  Filled 2022-12-09: qty 2

## 2022-12-09 MED ORDER — LIDOCAINE HCL 1 % IJ SOLN
INTRAMUSCULAR | Status: AC
  Filled 2022-12-09: qty 20

## 2022-12-09 MED ORDER — FENTANYL CITRATE (PF) 100 MCG/2ML IJ SOLN: 25.0000 ug | INTRAMUSCULAR | Status: DC | PRN

## 2022-12-09 MED ORDER — MAGNESIUM SULFATE 2 GM/50ML IV SOLN
2.0000 g | Freq: Once | INTRAVENOUS | Status: AC
  Administered 2022-12-09: 2 g via INTRAVENOUS
  Filled 2022-12-09: qty 50

## 2022-12-09 MED ORDER — ACETAMINOPHEN 10 MG/ML IV SOLN: 1000.0000 mg | Freq: Once | INTRAVENOUS | Status: DC | PRN

## 2022-12-09 MED ORDER — POTASSIUM CHLORIDE 10 MEQ/100ML IV SOLN
10.0000 meq | INTRAVENOUS | Status: DC
Start: 2022-12-09 — End: 2022-12-09
  Administered 2022-12-09 (×2): 10 meq via INTRAVENOUS
  Filled 2022-12-09 (×2): qty 100

## 2022-12-09 MED ORDER — ALPRAZOLAM 0.5 MG PO TABS: 1.0000 mg | ORAL_TABLET | Freq: Once | ORAL | Status: DC

## 2022-12-09 MED ORDER — IOHEXOL 300 MG/ML  SOLN
50.0000 mL | Freq: Once | INTRAMUSCULAR | Status: AC | PRN
Start: 2022-12-09 — End: 2022-12-09
  Administered 2022-12-09: 10 mL

## 2022-12-09 MED ORDER — FENTANYL CITRATE (PF) 100 MCG/2ML IJ SOLN
INTRAMUSCULAR | Status: AC
  Filled 2022-12-09: qty 2

## 2022-12-09 MED ORDER — POTASSIUM CHLORIDE CRYS ER 20 MEQ PO TBCR
40.0000 meq | EXTENDED_RELEASE_TABLET | Freq: Once | ORAL | Status: AC
  Administered 2022-12-09: 40 meq via ORAL
  Filled 2022-12-09: qty 2

## 2022-12-09 MED ORDER — ORAL CARE MOUTH RINSE: 15.0000 mL | Freq: Once | OROMUCOSAL | Status: AC

## 2022-12-09 MED ORDER — LIDOCAINE HCL 1 % IJ SOLN
20.0000 mL | Freq: Once | INTRAMUSCULAR | Status: AC
  Administered 2022-12-09: 4 mL via INTRADERMAL
  Filled 2022-12-09: qty 20

## 2022-12-09 MED ORDER — CHLORHEXIDINE GLUCONATE 0.12 % MT SOLN
15.0000 mL | Freq: Once | OROMUCOSAL | Status: AC
  Administered 2022-12-09: 15 mL via OROMUCOSAL
  Filled 2022-12-09: qty 15

## 2022-12-09 MED ORDER — LIDOCAINE 2% (20 MG/ML) 5 ML SYRINGE
INTRAMUSCULAR | Status: DC | PRN
  Administered 2022-12-09: 100 mg via INTRAVENOUS

## 2022-12-09 NOTE — Progress Notes (Signed)
PT Cancellation Note  Patient Details Name: Patrick Kidd MRN: [id number] DOB: Apr 21, 1945   Cancelled Treatment:    Reason Eval/Treat Not Completed: Patient at procedure or test/unavailable  Patient off the floor. Will follow-up as medically appropriate and schedule permits   Patrick Kidd, PT Acute Rehabilitation Services  Office [phone]  Patrick Kidd 12/09/2022, 10:18 AM

## 2022-12-09 NOTE — Progress Notes (Signed)
Physical Therapy Treatment Patient Details Name: Patrick Kidd MRN: [id number] DOB: November 28, 1945 Today's Date: 12/09/2022   History of Present Illness 77 y.o. male presents to [facility] hospital after his wife brought him back due to worsening chills and RUQ pain to which he reports was improving. +cholecystitis; 11/05 perc chole drain placed  PMH includes alcoholic cirrhosis, CLL, diabetes, HTN, peripheral neuropathy, Wolff-Parkinson-White syndrome, prostate CA.    PT Comments  Patient s/p perc chole drain placement this morning. Awake and wanting to walk to the bathroom. Required multiple attempts to successfully achieve standing with RW and mod assist. Reported his abd and left foot (IV site) were too sore and he was too tired to ambulate after all. Assisted pt to remain standing and use the urinal. Stood ~90 seconds total and returned to sitting. Briefly participated in LE exercise and then said he was done for the day.     If plan is discharge home, recommend the following: A little help with walking and/or transfers;Assistance with cooking/housework;Direct supervision/assist for medications management;Direct supervision/assist for financial management;Assist for transportation;Help with stairs or ramp for entrance;Supervision due to cognitive status   Can travel by private vehicle        Equipment Recommendations  None recommended by PT    Recommendations for Other Services       Precautions / Restrictions Precautions Precautions: Fall Precaution Comments: CiWA Restrictions Weight Bearing Restrictions: No     Mobility  Bed Mobility Overal bed mobility: Needs Assistance Bed Mobility: Rolling, Sidelying to Sit, Sit to Sidelying Rolling: Supervision, Used rails Sidelying to sit: Contact guard assist, HOB elevated, Used rails     Sit to sidelying: Mod assist, Used rails General bed mobility comments: cues for sequencing and need for passing through sidelying to protect abdomen  from incr pain    Transfers Overall transfer level: Needs assistance Equipment used: Rolling walker (2 wheels) Transfers: Sit to/from Stand Sit to Stand: Mod assist           General transfer comment: verbal and tactile cues for hand placement and mod assist from  EOB after multiple failed attempts (pt having difficulty flexing forward enough over BOS due to abd pain)    Ambulation/Gait               General Gait Details: pt stated he was hurting too much (abd and IV site left ankle)   Stairs             Wheelchair Mobility     Tilt Bed    Modified Rankin (Stroke Patients Only)       Balance Overall balance assessment: Needs assistance Sitting-balance support: No upper extremity supported, Feet supported Sitting balance-Leahy Scale: Fair Sitting balance - Comments: sitting EOB   Standing balance support: During functional activity Standing balance-Leahy Scale: Poor Standing balance comment: reliant on BUE support                            Cognition Arousal: Alert Behavior During Therapy: WFL for tasks assessed/performed Overall Cognitive Status: Impaired/Different from baseline Area of Impairment: Attention, Memory, Safety/judgement                   Current Attention Level: Focused, Sustained Memory: Decreased short-term memory, Decreased recall of precautions Following Commands: Follows one step commands with increased time Safety/Judgement: Decreased awareness of deficits Awareness: Intellectual Problem Solving: Decreased initiation, Difficulty sequencing, Slow processing, Requires verbal cues, Requires tactile cues General  Comments: alert but requiring cues to complete/continue tasks and for safety        Exercises General Exercises - Lower Extremity Long Arc Quad: AROM, Both, 5 reps (then stated he couldn't do anything else and wanted to return to supine)    General Comments General comments (skin integrity, edema,  etc.): RN in to disconnect IV from left ankle to incr ease of ambulation, however pt was unable to tolerate pain to ambulate (mostly abd)      Pertinent Vitals/Pain Pain Assessment Pain Assessment: Faces Faces Pain Scale: Hurts even more Pain Location: rt abd Pain Descriptors / Indicators: Discomfort, Grimacing Pain Intervention(s): Limited activity within patient's tolerance, Monitored during session    Home Living                          Prior Function            PT Goals (current goals can now be found in the care plan section) Acute Rehab PT Goals Patient Stated Goal: return to independence PT Goal Formulation: With patient Time For Goal Achievement: 12/22/22 Potential to Achieve Goals: Good Progress towards PT goals: Not progressing toward goals - comment (Pain)    Frequency    Min 1X/week      PT Plan      Co-evaluation              AM-PAC PT "6 Clicks" Mobility   Outcome Measure  Help needed turning from your back to your side while in a flat bed without using bedrails?: A Little Help needed moving from lying on your back to sitting on the side of a flat bed without using bedrails?: A Little Help needed moving to and from a bed to a chair (including a wheelchair)?: A Little Help needed standing up from a chair using your arms (e.g., wheelchair or bedside chair)?: A Little Help needed to walk in hospital room?: A Little Help needed climbing 3-5 steps with a railing? : A Lot 6 Click Score: 17    End of Session Equipment Utilized During Treatment: Gait belt Activity Tolerance: Patient limited by fatigue;Patient limited by pain Patient left: in bed;with call bell/phone within reach;with bed alarm set Nurse Communication: Mobility status;Other (comment) (ready to have IV hooked up again) PT Visit Diagnosis: Other abnormalities of gait and mobility (R26.89);Muscle weakness (generalized) (M62.81)     Time: 1610-9604 PT Time Calculation (min)  (ACUTE ONLY): 16 min  Charges:    $Therapeutic Activity: 8-22 mins PT General Charges $$ ACUTE PT VISIT: 1 Visit                      Jerolyn Center, PT Acute Rehabilitation Services  Office [phone]    Zena Amos 12/09/2022, 2:12 PM

## 2022-12-09 NOTE — Procedures (Addendum)
Interventional Radiology Procedure Note  Procedure: Image guided drain placement, perc chole.  51F pigtail drain.  Complications: None  EBL: None Sample: Culture sent  Recommendations: - Routine drain care, with sterile flushes, record output - follow up Cx - routine drain care - expect some RUQ pain post op from bile, should improve with prn's - ok to advance diet per primary order when goals met  Signed,  Yvone Neu. Loreta Ave, DO

## 2022-12-09 NOTE — H&P (Signed)
Chief Complaint: Patient was seen in consultation today for perc chole   Supervising Physician: Gilmer Mor  Patient Status: [facility] - In-pt  History of Present Illness: Patrick Kidd is a 77 y.o. male with cholecystitis by imaging and HIDA. Non-surgical candidate. Attempted perc chole 11/1 but acute delirium/encephalopathy, pt adamantly refused procedure. Has tolerated conservative management but still needs perc chole due to occluded cystic duct. Anesthesia was arrange for the case. Pt seen this am, wife at bedside. Mental status much improved.  PMHx, meds, labs, imaging, allergies reviewed. Has been NPO today as directed. Family at bedside.  Past Medical History:  Diagnosis Date   Acrophobia    Alcoholic cirrhosis (HCC) 10/04/2015   Anemia    Arthritis    foot by big toe   BPH associated with nocturia    Cataract    removed both eyes   Cholecystitis 05/2022   tx Abx   Chronic cough    PMH of   CLL (chronic lymphocytic leukemia) (HCC)    COVID-19 12/2018   Diabetes mellitus without complication (HCC)    Diverticulosis 07/03/2010   Colonoscopy.    Duodenal ulcer 2017   Fallen arches    Bilateral   GERD (gastroesophageal reflux disease)    Gout    Granuloma annulare    Hx of adenomatous colonic polyps multiple   Hydrocele 2011   Large septated right hydrocele   Liver cyst    Liver lesion    Nonspecific elevation of levels of transaminase or lactic acid dehydrogenase (LDH)    Obesity    Peripheral neuropathy    Plantar fasciitis    PMH of   Portal hypertension (HCC) 2017   Prostate cancer (HCC)    Right shoulder pain 11/2017   Sleep apnea    no cpap, patient denies   Thiamine deficiency    Wears reading eyeglasses    WPW (Wolff-Parkinson-White syndrome) 02/05/2019    Past Surgical History:  Procedure Laterality Date   BACK SURGERY  04/28/2022   CATARACT EXTRACTION, BILATERAL  12/2011   Dr Nile Riggs   COLONOSCOPY  2017   COLONOSCOPY W/  POLYPECTOMY  07/03/2010   2 adenomas, diverticulosis on right. Dr Leone Payor   CYSTOSCOPY N/A 03/15/2018   Procedure: Derinda Late;  Surgeon: Crista Elliot, MD;  Location: [facility];  Service: Urology;  Laterality: N/A;  NO SEEDS FOUND IN BLADDER   ESOPHAGOGASTRODUODENOSCOPY (EGD) WITH PROPOFOL N/A 01/08/2016   Procedure: ESOPHAGOGASTRODUODENOSCOPY (EGD) WITH PROPOFOL;  Surgeon: Meryl Dare, MD;  Location: [facility];  Service: Endoscopy;  Laterality: N/A;   FLEXIBLE SIGMOIDOSCOPY  2000   IR PERC CHOLECYSTOSTOMY  12/05/2022   RADIOACTIVE SEED IMPLANT N/A 03/15/2018   Procedure: RADIOACTIVE SEED IMPLANT/BRACHYTHERAPY IMPLANT;  Surgeon: Crista Elliot, MD;  Location: [facility];  Service: Urology;  Laterality: N/A;   73 SEEDS IMPLANTED   SIGMOIDOSCOPY     SPACE OAR INSTILLATION N/A 03/15/2018   Procedure: SPACE OAR INSTILLATION;  Surgeon: Crista Elliot, MD;  Location: [facility];  Service: Urology;  Laterality: N/A;   WISDOM TOOTH EXTRACTION      Allergies: Rifaximin, Beta adrenergic blockers, and Calcium channel blockers  Medications:  Current Facility-Administered Medications:    cefTRIAXone (ROCEPHIN) 2 g in sodium chloride 0.9 % 100 mL IVPB, 2 g, Intravenous, Q24H, Hunsucker, Lesia Sago, MD, Last Rate: 200 mL/hr at 12/08/22 1007, 2 g at 12/08/22 1007   Chlorhexidine Gluconate Cloth 2 %  PADS 6 each, 6 each, Topical, Daily, Hunsucker, Lesia Sago, MD, 6 each at 12/08/22 0814   docusate sodium (COLACE) capsule 100 mg, 100 mg, Oral, BID PRN, Celine Mans, Rahul P, PA-C   folic acid (FOLVITE) tablet 1 mg, 1 mg, Oral, Daily, Crosley, Debby, MD, 1 mg at 12/08/22 1610   haloperidol lactate (HALDOL) injection 2 mg, 2 mg, Intravenous, Q6H PRN, Elgergawy, Leana Roe, MD, 2 mg at 12/08/22 2240   hydrALAZINE (APRESOLINE) injection 5 mg, 5 mg, Intravenous, Q4H PRN, Elgergawy, Leana Roe, MD   hydrALAZINE (APRESOLINE) tablet 25 mg, 25 mg, Oral,  Q6H, Elgergawy, Leana Roe, MD, 25 mg at 12/09/22 9604   insulin aspart (novoLOG) injection 0-15 Units, 0-15 Units, Subcutaneous, Q4H, Desai, Rahul P, PA-C, 2 Units at 12/09/22 0834   melatonin tablet 3 mg, 3 mg, Oral, QHS PRN, Hunsucker, Lesia Sago, MD, 3 mg at 12/05/22 2318   metroNIDAZOLE (FLAGYL) IVPB 500 mg, 500 mg, Intravenous, Q12H, Elgergawy, Leana Roe, MD, Last Rate: 100 mL/hr at 12/08/22 2141, 500 mg at 12/08/22 2141   multivitamin with minerals tablet 1 tablet, 1 tablet, Oral, Daily, Crosley, Debby, MD, 1 tablet at 12/08/22 0813   ondansetron (ZOFRAN) injection 4 mg, 4 mg, Intravenous, Q6H PRN, Celine Mans, Rahul P, PA-C   Oral care mouth rinse, 15 mL, Mouth Rinse, PRN, Karie Fetch P, DO   pantoprazole (PROTONIX) injection 40 mg, 40 mg, Intravenous, Q12H, Hunsucker, Lesia Sago, MD, 40 mg at 12/08/22 2137   polyethylene glycol (MIRALAX / GLYCOLAX) packet 17 g, 17 g, Oral, Daily PRN, Desai, Rahul P, PA-C   potassium chloride 10 mEq in 100 mL IVPB, 10 mEq, Intravenous, Q1 Hr x 3, Elgergawy, Leana Roe, MD, Last Rate: 100 mL/hr at 12/09/22 0815, 10 mEq at 12/09/22 0815   QUEtiapine (SEROQUEL) tablet 100 mg, 100 mg, Oral, QHS, Elgergawy, Leana Roe, MD, 100 mg at 12/08/22 2137   thiamine (VITAMIN B1) tablet 100 mg, 100 mg, Oral, Daily, 100 mg at 12/08/22 0813 **OR** thiamine (VITAMIN B1) injection 100 mg, 100 mg, Intravenous, Daily, Crosley, Debby, MD, 100 mg at 12/07/22 0850    Family History  Problem Relation Age of Onset   Lung cancer Mother        smoker   Leukemia Father        Acute myelocytic   Diabetes Neg Hx    Stroke Neg Hx    Heart disease Neg Hx    Colon cancer Neg Hx    Colon polyps Neg Hx    Esophageal cancer Neg Hx    Rectal cancer Neg Hx    Stomach cancer Neg Hx     Social History   Socioeconomic History   Marital status: Married    Spouse name: [name]   Number of children: 2   Years of education: Not on file   Highest education level: Some college, no degree   Occupational History   Occupation: Multimedia programmer estate  Tobacco Use   Smoking status: Former    Current packs/day: 0.00    Average packs/day: 2.0 packs/day for 6.0 years (12.0 ttl pk-yrs)    Types: Cigarettes    Start date: 02/03/1965    Quit date: 02/04/1971    Years since quitting: 51.8    Passive exposure: Never   Smokeless tobacco: Never   Tobacco comments:    smoked age [age], up to 2 ppd  Vaping Use   Vaping status: Never Used  Substance and Sexual Activity   Alcohol use: Not Currently  Comment: No alochol since Christmas Day 2020   Drug use: No   Sexual activity: Yes    Partners: Female  Other Topics Concern   Not on file  Social History Narrative   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 He lives with his wife in a one level home. He swims most days.   Social Determinants of Health   Financial Resource Strain: Low Risk  (12/02/2021)   Overall Financial Resource Strain (CARDIA)    Difficulty of Paying Living Expenses: Not hard at all  Food Insecurity: No Food Insecurity (12/06/2022)   Hunger Vital Sign    Worried About Running Out of Food in the Last Year: Never true    Ran Out of Food in the Last Year: Never true  Transportation Needs: No Transportation Needs (12/06/2022)   PRAPARE - Administrator, Civil Service (Medical): No    Lack of Transportation (Non-Medical): No  Physical Activity: Inactive (12/02/2021)   Exercise Vital Sign    Days of Exercise per Week: 0 days    Minutes of Exercise per Session: 0 min  Stress: No Stress Concern Present (12/02/2021)   [employer] of Occupational Health - Occupational Stress Questionnaire    Feeling of Stress : Not at all  Social Connections: Moderately Integrated (12/02/2021)   Social Connection and Isolation Panel [NHANES]    Frequency  of Communication with Friends and Family: More than three times a week    Frequency of Social Gatherings with Friends and Family: More than three times a week    Attends Religious Services: Never    Database administrator or Organizations: Yes    Attends Engineer, structural: More than 4 times per year    Marital Status: Married    Review of Systems: A 12 point ROS discussed and pertinent positives are indicated in the HPI above.  All other systems are negative.  Review of Systems  Vital Signs: BP (!) 139/54 (BP Location: Left Arm)   Pulse 92   Temp 97.6 F (36.4 C) (Oral)   Resp 18   Ht 6\' 3"  (1.905 m)   Wt 244 lb 4.3 oz (110.8 kg)   SpO2 100%   BMI 30.53 kg/m   Physical Exam Constitutional:      Appearance: He is not ill-appearing.  HENT:     Mouth/Throat:     Mouth: Mucous membranes are moist.     Pharynx: Oropharynx is clear.  Cardiovascular:     Rate and Rhythm: Normal rate and regular rhythm.     Heart sounds: Normal heart sounds.  Pulmonary:     Effort: Pulmonary effort is normal. No respiratory distress.     Breath sounds: Normal breath sounds.  Abdominal:     General: There is distension.     Palpations: Abdomen is soft.     Tenderness: There is no abdominal tenderness.  Neurological:     General: No focal deficit present.     Mental Status: He is alert and oriented to person, place, and time.  Psychiatric:        Behavior: Behavior normal.     Imaging: DG Chest Port 1 View  Result Date: 12/08/2022 CLINICAL DATA:  Dyspnea, fever EXAM: PORTABLE CHEST 1 VIEW COMPARISON:  12/06/2022 chest radiograph. FINDINGS: Low lung  volumes. Stable cardiomediastinal silhouette with normal heart size. No pneumothorax. No pulmonary edema. Mild streaky hazy bibasilar lung opacities and slight blunting of the costophrenic angles bilaterally, improved on the right and stable on the left. IMPRESSION: Low lung volumes with mild streaky hazy bibasilar lung opacities and  slight blunting of the costophrenic angles bilaterally, improved on the right and stable on the left. Favor atelectasis and possible small bilateral pleural effusions. A component of pneumonia cannot be excluded. Electronically Signed   By: Delbert Phenix M.D.   On: 12/08/2022 16:23   ECHOCARDIOGRAM COMPLETE  Result Date: 12/06/2022    ECHOCARDIOGRAM REPORT   Patient Name:   Patrick Kidd Date of Exam: 12/06/2022 Medical Rec #:  [id number]          Height:       75.0 in Accession #:    [id number]         Weight:       249.3 lb Date of Birth:  [birth year]          BSA:          2.410 m Patient Age:    77 years           BP:           128/70 mmHg Patient Gender: M                  HR:           91 bpm. Exam Location:  Inpatient Procedure: 2D Echo Indications:    elevated troponin  History:        Patient has prior history of Echocardiogram examinations, most                 recent 01/29/2019. Chronic kidney disease, Arrythmias:WPW; Risk                 Factors:Diabetes, Hypertension and Dyslipidemia.  Sonographer:    Delcie Roch RDCS Referring Phys: 39 DAWOOD Teena Irani  Sonographer Comments: Image acquisition challenging due to uncooperative patient. IMPRESSIONS  1. Left ventricular ejection fraction, by estimation, is 55 to 60%. The left ventricle has normal function. The left ventricle has no regional wall motion abnormalities. There is mild left ventricular hypertrophy. Left ventricular diastolic parameters are indeterminate.  2. Right ventricular systolic function is normal. The right ventricular size is mildly enlarged. There is moderately elevated pulmonary artery systolic pressure.  3. Left atrial size was mildly dilated.  4. The mitral valve is normal in structure. Trivial mitral valve regurgitation. No evidence of mitral stenosis.  5. The tricuspid valve is abnormal.  6. The aortic valve is tricuspid. Aortic valve regurgitation is not visualized. No aortic stenosis is present.  7. The inferior  vena cava is dilated in size with <50% respiratory variability, suggesting right atrial pressure of 15 mmHg. FINDINGS  Left Ventricle: Left ventricular ejection fraction, by estimation, is 55 to 60%. The left ventricle has normal function. The left ventricle has no regional wall motion abnormalities. The left ventricular internal cavity size was normal in size. There is  mild left ventricular hypertrophy. Left ventricular diastolic parameters are indeterminate. Right Ventricle: The right ventricular size is mildly enlarged. Right vetricular wall thickness was not well visualized. Right ventricular systolic function is normal. There is moderately elevated pulmonary artery systolic pressure. The tricuspid regurgitant velocity is 3.04 m/s, and with an assumed right atrial pressure of 15 mmHg, the estimated right ventricular systolic pressure is 52.0 mmHg. Left Atrium: Left  atrial size was mildly dilated. Right Atrium: Right atrial size was normal in size. Pericardium: There is no evidence of pericardial effusion. Mitral Valve: The mitral valve is normal in structure. Trivial mitral valve regurgitation. No evidence of mitral valve stenosis. Tricuspid Valve: The tricuspid valve is abnormal. Tricuspid valve regurgitation is mild . No evidence of tricuspid stenosis. Aortic Valve: The aortic valve is tricuspid. Aortic valve regurgitation is not visualized. No aortic stenosis is present. Aortic valve mean gradient measures 4.9 mmHg. Aortic valve peak gradient measures 11.1 mmHg. Aortic valve area, by VTI measures 2.00  cm. Pulmonic Valve: The pulmonic valve was not well visualized. Pulmonic valve regurgitation is trivial. No evidence of pulmonic stenosis. Aorta: The aortic root and ascending aorta are structurally normal, with no evidence of dilitation. Venous: The inferior vena cava is dilated in size with less than 50% respiratory variability, suggesting right atrial pressure of 15 mmHg. IAS/Shunts: No atrial level shunt  detected by color flow Doppler.  LEFT VENTRICLE PLAX 2D LVIDd:         5.10 cm      Diastology LVIDs:         3.40 cm      LV e' medial:    5.98 cm/s LV PW:         1.20 cm      LV E/e' medial:  13.4 LV IVS:        1.10 cm      LV e' lateral:   8.38 cm/s LVOT diam:     1.90 cm      LV E/e' lateral: 9.5 LV SV:         59 LV SV Index:   24 LVOT Area:     2.84 cm  LV Volumes (MOD) LV vol d, MOD A2C: 104.0 ml LV vol d, MOD A4C: 103.0 ml LV vol s, MOD A2C: 53.4 ml LV vol s, MOD A4C: 52.2 ml LV SV MOD A2C:     50.6 ml LV SV MOD A4C:     103.0 ml LV SV MOD BP:      51.1 ml RIGHT VENTRICLE             IVC RV Basal diam:  3.40 cm     IVC diam: 2.80 cm RV S prime:     14.70 cm/s TAPSE (M-mode): 1.8 cm LEFT ATRIUM           Index        RIGHT ATRIUM           Index LA diam:      3.50 cm 1.45 cm/m   RA Area:     20.80 cm LA Vol (A2C): 67.4 ml 27.97 ml/m  RA Volume:   65.20 ml  27.05 ml/m LA Vol (A4C): 95.6 ml 39.67 ml/m  AORTIC VALVE AV Area (Vmax):    1.84 cm AV Area (Vmean):   2.08 cm AV Area (VTI):     2.00 cm AV Vmax:           166.83 cm/s AV Vmean:          99.839 cm/s AV VTI:            0.295 m AV Peak Grad:      11.1 mmHg AV Mean Grad:      4.9 mmHg LVOT Vmax:         108.00 cm/s LVOT Vmean:        73.300 cm/s LVOT VTI:  0.208 m LVOT/AV VTI ratio: 0.70  AORTA Ao Root diam: 3.40 cm Ao Asc diam:  3.40 cm MITRAL VALVE               TRICUSPID VALVE MV Area (PHT): 4.80 cm    TR Peak grad:   37.0 mmHg MV Decel Time: 158 msec    TR Vmax:        304.00 cm/s MV E velocity: 79.90 cm/s MV A velocity: 81.10 cm/s  SHUNTS MV E/A ratio:  0.99        Systemic VTI:  0.21 m                            Systemic Diam: 1.90 cm Dina Rich MD Electronically signed by Dina Rich MD Signature Date/Time: 12/06/2022/3:27:17 PM    Final    DG CHEST PORT 1 VIEW  Result Date: 12/06/2022 CLINICAL DATA:  Dyspnea, cough EXAM: PORTABLE CHEST 1 VIEW COMPARISON:  12/04/2022 FINDINGS: Lung volumes are small. Developing right  basilar opacity may represent increasing atelectasis or developing focal pulmonary infiltrate. No pneumothorax or pleural effusion. Cardiac size within normal limits. Pulmonary vascular crowding at the hila without overt pulmonary edema. No acute bone abnormality. IMPRESSION: 1. Pulmonary hypoinflation. 2. Developing right basilar opacity, atelectasis versus developing focal pulmonary infiltrate. Electronically Signed   By: Helyn Numbers M.D.   On: 12/06/2022 03:17   DG Abd 1 View  Result Date: 12/06/2022 CLINICAL DATA:  Cough, abdominal pain EXAM: ABDOMEN - 1 VIEW COMPARISON:  05/30/2022 FINDINGS: Right flank is excluded from view. Visualized abdominal gas pattern is nonobstructive. No gross free intraperitoneal gas. Underpenetration precludes evaluation of the visceral shadows. Osseous structures are age-appropriate. IMPRESSION: 1. Nonobstructive abdominal gas pattern. Electronically Signed   By: Helyn Numbers M.D.   On: 12/06/2022 03:15   IR Perc Cholecystostomy  Result Date: 12/05/2022 INDICATION: 77 year old male with acute cholecystitis EXAM: IMAGE GUIDED PERCUTANEOUS CHOLECYSTOSTOMY DISCONTINUATION OF PERCUTANEOUS CHOLECYSTOSTOMY MEDICATIONS: None ANESTHESIA/SEDATION: Moderate (conscious) sedation was not employed during this procedure. A total of Versed 1.0 mg and Fentanyl 0 mcg was administered intravenously. Moderate Sedation Time: 0 minutes. The patient's level of consciousness and vital signs were monitored continuously by radiology nursing throughout the procedure under my direct supervision. FLUOROSCOPY TIME:  None COMPLICATIONS: None PROCEDURE: Informed written consent was obtained from the patient after a thorough discussion of the procedural risks, benefits and alternatives. All questions were addressed. Maximal Sterile Barrier Technique was utilized including caps, mask, sterile gowns, sterile gloves, sterile drape, hand hygiene and skin antiseptic. A timeout was performed prior to the  initiation of the procedure. The patient was laid flat on the table under the image intensifier. Before prep and drape could occur, the patient began complaining of crossed a phobia and difficulty breathing. 1 mg of anxiolytic was administered, which did not help him. The patient sat up and I had a long conversation with the patient about the necessity of the procedure. He refused to continue, essentially withdrawing his consent and we were unable to proceed. He would not continue with the procedure. We had no choice but to discontinue. IMPRESSION: Discontinued attempt at percutaneous cholecystostomy as above Electronically Signed   By: Gilmer Mor D.O.   On: 12/05/2022 17:16   NM Hepatobiliary Liver Func  Result Date: 12/05/2022 CLINICAL DATA:  Abdominal pain.  Cholelithiasis. EXAM: NUCLEAR MEDICINE HEPATOBILIARY IMAGING TECHNIQUE: Sequential images of the abdomen were obtained out to 60 minutes following intravenous  administration of radiopharmaceutical. RADIOPHARMACEUTICALS:  5.0 mCi Tc-83m  Choletec IV COMPARISON:  CT 12/04/2022 FINDINGS: Patient image for 1 hour. Counts are read bili clear from the blood pool in thickened of the liver. Counts are evident in the small bowel by 20 minutes. The gallbladder fails to fill at 60 minutes. Patient was agitated and could not proceed further with imaging. IMPRESSION: Non filling of the gallbladder at 60 minutes could indicate cystic duct obstruction. Patient could not tolerate further imaging. Equivocal exam Electronically Signed   By: Genevive Bi M.D.   On: 12/05/2022 14:07   DG CHEST PORT 1 VIEW  Result Date: 12/04/2022 CLINICAL DATA:  Shortness of breath.  Sepsis EXAM: PORTABLE CHEST 1 VIEW COMPARISON:  X-ray 12/03/2022 and older FINDINGS: Underinflation. Small effusions. Adjacent mild opacity is stable. No pneumothorax. Stable enlarged cardiopericardial silhouette. No edema. Overlapping cardiac leads. Degenerative changes. IMPRESSION: No significant  interval change when adjusting for technique. Electronically Signed   By: Karen Kays M.D.   On: 12/04/2022 14:02   US Abdomen Limited RUQ (LIVER/GB)  Result Date: 12/04/2022 CLINICAL DATA:  Cholecystitis EXAM: ULTRASOUND ABDOMEN LIMITED RIGHT UPPER QUADRANT COMPARISON:  Ultrasound 05/30/2022 and CT scan 12/03/2022. FINDINGS: Gallbladder: Distended gallbladder. Slight wall thickening but the patient has a known chronic liver disease. Previous areas of stone is not well appreciated today. This could be technical. Common bile duct: Diameter: 3 mm Liver: Heterogeneous liver consistent with known chronic liver disease. There is a anechoic rounded structure with through transmission in the liver measuring 6.5 cm consistent with a cyst. Portal vein is patent on color Doppler imaging with normal direction of blood flow towards the liver. Other: None. IMPRESSION: Nodular echogenic liver consistent with known chronic liver disease. Hepatic cysts. Please correlate with the prior CT scan. Distended gallbladder with wall thickening but nonspecific in the presence of chronic liver disease. There is some sludge. The stone seen previously are not as well seen on this ultrasound but are likely present based on prior imaging. If there is further concern of acute cholecystitis a HIDA scan could be considered as clinically appropriate for further delineation. Electronically Signed   By: Karen Kays M.D.   On: 12/04/2022 14:01   CT ABDOMEN PELVIS W CONTRAST  Result Date: 12/03/2022 CLINICAL DATA:  Abdomen pain EXAM: CT ABDOMEN AND PELVIS WITH CONTRAST TECHNIQUE: Multidetector CT imaging of the abdomen and pelvis was performed using the standard protocol following bolus administration of intravenous contrast. RADIATION DOSE REDUCTION: This exam was performed according to the departmental dose-optimization program which includes automated exposure control, adjustment of the mA and/or kV according to patient size and/or use of  iterative reconstruction technique. CONTRAST:  75mL OMNIPAQUE IOHEXOL 350 MG/ML SOLN COMPARISON:  CT 05/30/2022 FINDINGS: Lower chest: Lung bases demonstrate small right-sided pleural effusion. Small right cardio phrenic lymph nodes without significant change. Hepatobiliary: Cirrhotic morphology of the liver. 6.1 cm right hepatic cyst for which no specific imaging follow-up is recommended. Mildly distended gallbladder with multiple stones. Possible gallbladder wall thickening. Trace pericholecystic fluid but there is perihepatic ascites Pancreas: Unremarkable. No pancreatic ductal dilatation or surrounding inflammatory changes. Spleen: Enlarged at 16 cm. Adrenals/Urinary Tract: Adrenal glands are normal. Kidneys show no hydronephrosis. The bladder is unremarkable. No excretion of contrast on delayed views of the kidneys. Stomach/Bowel: Stomach nonenlarged. No dilated small bowel. Diverticular disease of the colon. Question mild wall thickening and inflammation at the hepatic flexure on coronal series 6, image 123 through 151. Negative appendix. Vascular/Lymphatic: Moderate aortic atherosclerosis.  No aneurysm. No suspicious lymph nodes. Reproductive: Multiple prostate seeds. Other: Negative for pelvic effusion or free air. Small volume right upper quadrant ascites Musculoskeletal: No acute or suspicious osseous abnormality. Multilevel degenerative changes IMPRESSION: 1. 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 2. Mildly distended gallbladder with multiple stones. Possible gallbladder wall thickening and trace pericholecystic fluid but there is perihepatic ascites. Correlate clinically for acute cholecystitis. Follow-up ultrasound if deemed clinically appropriate. 3. Diverticular disease of the colon. Question mild wall thickening and inflammation at the hepatic flexure of the colon, as may be seen with mild right-sided diverticulitis. 4. Small right pleural effusion. 5. Aortic  atherosclerosis. Aortic Atherosclerosis (ICD10-I70.0). Electronically Signed   By: Jasmine Pang M.D.   On: 12/03/2022 21:15   DG Chest Port 1 View  Result Date: 12/03/2022 CLINICAL DATA:  Shortness of breath and fever EXAM: PORTABLE CHEST 1 VIEW COMPARISON:  01/28/2019 FINDINGS: Low lung volumes. Subsegmental atelectasis at the bases. Stable cardiomediastinal silhouette. No pneumothorax IMPRESSION: Low lung volumes with subsegmental atelectasis at the bases. Electronically Signed   By: Jasmine Pang M.D.   On: 12/03/2022 20:23    Labs:  CBC: Recent Labs    12/06/22 0443 12/07/22 0344 12/08/22 0458 12/09/22 0423  WBC 12.5* 7.5 6.8 7.0  HGB 11.7* 11.1* 11.4* 11.8*  HCT 33.9* 32.2* 33.2* 33.7*  PLT 47* 49* 63* 90*    COAGS: Recent Labs    06/10/22 0959 12/03/22 1850 12/08/22 0834 12/09/22 0423  INR 1.3* 1.5* 1.1 1.2  APTT  --  32  --   --     BMP: Recent Labs    12/06/22 0443 12/07/22 0344 12/08/22 0458 12/09/22 0423  NA 138 139 139 138  K 4.1 3.4* 3.5 3.2*  CL 107 108 109 109  CO2 21* 21* 21* 20*  GLUCOSE 146* 172* 155* 130*  BUN 35* 24* 15 13  CALCIUM 7.9* 8.0* 7.9* 7.9*  CREATININE 1.37* 1.12 1.01 0.97  GFRNONAA 53* >60 >60 >60    LIVER FUNCTION TESTS: Recent Labs    12/06/22 0443 12/07/22 0344 12/08/22 0458 12/09/22 0423  BILITOT 1.4* 1.3* 1.2* 1.3*  AST 94* 54* 37 25  ALT 31 27 25 23   ALKPHOS 57 67 65 72  PROT 5.5* 5.0* 5.1* 5.1*  ALBUMIN 2.6* 2.4* 2.4* 2.4*     Assessment and Plan: Cholecystitis Plan for perc chole today. Anesthesia booked, he may be able to get through procedure with sedation only but would prefer to have them provide in the event any acute anxiety/delirium occur again. Labs reviewed. Risks and benefits discussed again with the patient and his wife including, but not limited to bleeding, infection, gallbladder perforation, bile leak, sepsis or even death.  All of the patient's questions were answered, patient is agreeable  to proceed. Consent signed and in chart.    Electronically Signed: Brayton El, PA-C 12/09/2022, 8:40 AM   I spent a total of 20 minutes in face to face in clinical consultation, greater than 50% of which was counseling/coordinating care for perc chole

## 2022-12-09 NOTE — Plan of Care (Signed)
  Problem: Education: Goal: Ability to describe self-care measures that may prevent or decrease complications (Diabetes Survival Skills Education) will improve Outcome: Progressing Goal: Individualized Educational Video(s) Outcome: Progressing   Problem: Coping: Goal: Ability to adjust to condition or change in health will improve Outcome: Progressing   Problem: Fluid Volume: Goal: Ability to maintain a balanced intake and output will improve Outcome: Progressing   Problem: Health Behavior/Discharge Planning: Goal: Ability to identify and utilize available resources and services will improve Outcome: Progressing Goal: Ability to manage health-related needs will improve Outcome: Progressing   Problem: Metabolic: Goal: Ability to maintain appropriate glucose levels will improve Outcome: Progressing   Problem: Nutritional: Goal: Maintenance of adequate nutrition will improve Outcome: Progressing Goal: Progress toward achieving an optimal weight will improve Outcome: Progressing   Problem: Skin Integrity: Goal: Risk for impaired skin integrity will decrease Outcome: Progressing   Problem: Tissue Perfusion: Goal: Adequacy of tissue perfusion will improve Outcome: Progressing   Problem: Education: Goal: Knowledge of General Education information will improve Description: Including pain rating scale, medication(s)/side effects and non-pharmacologic comfort measures Outcome: Progressing   Problem: Health Behavior/Discharge Planning: Goal: Ability to manage health-related needs will improve Outcome: Progressing   Problem: Clinical Measurements: Goal: Ability to maintain clinical measurements within normal limits will improve Outcome: Progressing Goal: Will remain free from infection Outcome: Progressing Goal: Diagnostic test results will improve Outcome: Progressing Goal: Respiratory complications will improve Outcome: Progressing Goal: Cardiovascular complication will  be avoided Outcome: Progressing   Problem: Activity: Goal: Risk for activity intolerance will decrease Outcome: Progressing   Problem: Nutrition: Goal: Adequate nutrition will be maintained Outcome: Progressing   Problem: Coping: Goal: Level of anxiety will decrease Outcome: Progressing   Problem: Elimination: Goal: Will not experience complications related to bowel motility Outcome: Progressing Goal: Will not experience complications related to urinary retention Outcome: Progressing   Problem: Pain Management: Goal: General experience of comfort will improve Outcome: Progressing   Problem: Safety: Goal: Ability to remain free from injury will improve Outcome: Progressing   Problem: Skin Integrity: Goal: Risk for impaired skin integrity will decrease Outcome: Progressing   Problem: Safety: Goal: Non-violent Restraint(s) Outcome: Progressing

## 2022-12-09 NOTE — Transfer of Care (Signed)
Immediate Anesthesia Transfer of Care Note  Patient: Patrick Kidd  Procedure(s) Performed: IR WITH ANESTHESIA  Patient Location: PACU  Anesthesia Type:MAC  Level of Consciousness: drowsy and patient cooperative  Airway & Oxygen Therapy: Patient Spontanous Breathing  Post-op Assessment: Report given to RN and Post -op Vital signs reviewed and stable  Post vital signs: Reviewed and stable  Last Vitals:  Vitals Value Taken Time  BP    Temp    Pulse 79 12/09/22 1123  Resp 13 12/09/22 1123  SpO2 96 % 12/09/22 1123  Vitals shown include unfiled device data.  Last Pain:  Vitals:   12/09/22 0911  TempSrc:   PainSc: 6       Patients Stated Pain Goal: 1 (12/09/22 0911)  Complications: No notable events documented.

## 2022-12-09 NOTE — Progress Notes (Signed)
PROGRESS NOTE    Patrick Kidd  UXL:[id number] DOB: December 22, 1945 DOA: 12/03/2022 PCP: Etta Grandchild, MD   Chief Complaint  Patient presents with   Fever    Fever chills and diaphoresis onset around 1600 today. Pt was seen at [facility] yesterday. Hx of cirrhosis of the liver and gallstones. Abd distention noted. Low grade fever pta 100.4. EMS reports pt HR 150 pta and sating 83 percent on RA. Pt given bolus and placed on 3L . Pt systolic 90s to 100s and tachypneic in the 30s pta.    Brief Narrative:   Patrick Kidd is a 77 y.o. male who has a PMH as below including but not limited to EtOH cirrhosis well controlled and no longer drinking, PUD requiring cautery in the past, DM, cholecystitis. He had admission back in April 2024 for acute calculous cholecystitis and E.coli bacteremia. He was deemed to be at increased risk for surgery; therefore, he was treated conservatively with abx with outpatient GI follow up. He did well following that admission with no issues.   On 10/29, he began to have RUQ pain that prompted an ED visit to [facility]. He felt improved with nausea meds, pain meds, GI cocktail and refused imaging at the time and asked to be discharged home.   On 10/30, he was doing well besides some pain. He called Dr. Chiquita Loth with GI for an Rx for pain pills that could get him through until his follow up appointment which was scheduled for 10/31. Around 1600 he began to have chills. These got worse so he came back to ED.   In ED, he was hypotensive and shocky. He received 3L NS with some improvement in BP during 3rd liter. He also had AKI, lactic acidosis, leukopenia, thrombocytopenia. CT A/P demonstrated acute cholecystitis. Given his constellation of lab abnormalities and concern that his pressure would deteriorate further, patient was admitted to ICU, his blood pressure has improved, general surgery has been consulted, recommendation for percutaneous cholecystostomy drain insertion for  which IR has been consulted, unfortunately percutaneous cholecystostomy drain by IR unsuccessful due to noncompliance during procedure.  Patient continues to improve, plan for percutaneous cholecystostomy drain with close anesthesia follow-up   Assessment & Plan:   Principal Problem:   Septic shock (HCC)    Septic shock due to acute calculus cholecystitis with E. coli and Klebsiella bacteremia  -Patient high risk for laparoscopic cholecystectomy, general surgery recommend to proceed with percutaneous drain  -Unsuccessful percutaneous cholecystostomy 11/1 secondary to medication noncompliance during procedures, confusion -IR input greatly appreciated, plan for percutaneous cholecystostomy drain, anesthesia on board . -Continue with IV Rocephin and IV Flagyl-No further requirement of vasopressors, weaned off morning 10-31  Acute metabolic encephalopathy  Hospital delirium  -Patient is high risk of delirium given his liver disease, sepsis  -He is with significant agitation and restlessness, none cooperative with procedure earlier. -Wife denies any alcohol abuse for few years  -Continue with as needed Haldol for agitation, -Continue with Seroquel  -Much improved this morning, back to baseline, awake, alert and appropriate, pleasant  acute kidney injury:  -Suspect ATN in setting of sepsis, hypovolemia possible now adequate fluid resuscitated.  No hydronephrosis on CT abdomen pelvis. -Continue with IV fluids and avoid nephrotoxic medications   Diabetes with hyperglycemia:  - on 50 units long acting at home -- SSI --carb modified once taking PO  Hypophosphatemia Hypokalemia Hypomagnesemia - repleted   Thrombocytopenia:  -In setting of CLL, and liver cirrhosis worsened with consumption of  sepsis -- SCDs, hold chemoprophylaxis until it is greater than 50 -- will transfuse if invasive procedures (perc chole tube) are recommended   Cirrhosis: Child Pugh A (6 points) --well  compensated, feel would tolerate procedures  Elevated troponins -Were elevated in the setting of septic shock, he never had complaints of chest pain, troponins are trending down, non-ACS pattern, 2D echo was obtained, it is reassuring, it is with a preserved EF, no regional wall motion abnormalities, and no significant valvular disease.    Hypokalemia - Repleted   chronic diastolic CHF -Patient on aggressive IV hydration given sepsis, volume status much improved, BNP mildly elevated, but no significant evidence of volume overload, will resume on Lasix when stable.        DVT prophylaxis: SCD, can consider Lovenox once thrombocytopenia resolves and cleared by IR Code Status: Full Family Communication: D/W wife at bedside,  Disposition:   Status is: Inpatient    Consultants:  PCCM General surgery Interventional radiology  Subjective:  No significant events overnight as discussed with staff, he had a good night, patient report is feeling much better, denies any complaints today  Objective: Vitals:   12/09/22 0401 12/09/22 0500 12/09/22 0802 12/09/22 0854  BP:   (!) 139/54 (!) 146/63  Pulse:   92 89  Resp: 16  18 18   Temp:   97.6 F (36.4 C) 97.9 F (36.6 C)  TempSrc: Oral  Oral Oral  SpO2:   100% 97%  Weight:  110.8 kg  104.3 kg  Height:    6\' 3"  (1.905 m)    Intake/Output Summary (Last 24 hours) at 12/09/2022 1034 Last data filed at 12/08/2022 2141 Gross per 24 hour  Intake 220 ml  Output 600 ml  Net -380 ml   Filed Weights   12/08/22 0630 12/09/22 0500 12/09/22 0854  Weight: 110.3 kg 110.8 kg 104.3 kg    Examination:  Awake Alert, Oriented X 3, sitting in chair, appropriate, pleasant, no apparent distress Symmetrical Chest wall movement, diminished air entry at the bases with some rales, but no tachypnea or use of accessory muscles RRR,No Gallops,Rubs or new Murmurs, No Parasternal Heave +ve B.Sounds, Abd Soft, No tenderness, No rebound - guarding or  rigidity. No Cyanosis, Clubbing or edema, No new Rash or bruise     Data Reviewed: I have personally reviewed following labs and imaging studies  CBC: Recent Labs  Lab 12/03/22 1850 12/04/22 0031 12/05/22 1301 12/06/22 0443 12/07/22 0344 12/08/22 0458 12/09/22 0423  WBC 2.9*   < > 14.7* 12.5* 7.5 6.8 7.0  NEUTROABS 1.9  --   --  10.9*  --   --   --   HGB 14.7   < > 12.9* 11.7* 11.1* 11.4* 11.8*  HCT 41.8   < > 37.4* 33.9* 32.2* 33.2* 33.7*  MCV 95.9   < > 96.6 93.9 94.2 94.3 93.1  PLT 58*   < > 42* 47* 49* 63* 90*   < > = values in this interval not displayed.    Basic Metabolic Panel: Recent Labs  Lab 12/04/22 0031 12/05/22 1124 12/06/22 0443 12/07/22 0344 12/08/22 0458 12/09/22 0423  NA 134* 134* 138 139 139 138  K 3.6 4.5 4.1 3.4* 3.5 3.2*  CL 102 104 107 108 109 109  CO2 19* 16* 21* 21* 21* 20*  GLUCOSE 153* 107* 146* 172* 155* 130*  BUN 31* 46* 35* 24* 15 13  CREATININE 1.90* 1.71* 1.37* 1.12 1.01 0.97  CALCIUM 7.8* 7.7* 7.9* 8.0*  7.9* 7.9*  MG 1.6* 2.4  --   --  1.6* 1.9  PHOS 2.1* 3.0  --  1.7* 3.7 3.1    GFR: Estimated Creatinine Clearance: 82 mL/min (by C-G formula based on SCr of 0.97 mg/dL).  Liver Function Tests: Recent Labs  Lab 12/05/22 1124 12/06/22 0443 12/07/22 0344 12/08/22 0458 12/09/22 0423  AST 103* 94* 54* 37 25  ALT 28 31 27 25 23   ALKPHOS 57 57 67 65 72  BILITOT 1.3* 1.4* 1.3* 1.2* 1.3*  PROT 5.4* 5.5* 5.0* 5.1* 5.1*  ALBUMIN 2.7* 2.6* 2.4* 2.4* 2.4*    CBG: Recent Labs  Lab 12/08/22 1619 12/08/22 1942 12/08/22 2350 12/09/22 0354 12/09/22 0813  GLUCAP 134* 162* 170* 126* 139*     Recent Results (from the past 240 hour(s))  Resp panel by RT-PCR (RSV, Flu A&B, Covid) Anterior Nasal Swab     Status: None   Collection Time: 12/03/22  6:55 PM   Specimen: Anterior Nasal Swab  Result Value Ref Range Status   SARS Coronavirus 2 by RT PCR NEGATIVE NEGATIVE Final   Influenza A by PCR NEGATIVE NEGATIVE Final   Influenza  B by PCR NEGATIVE NEGATIVE Final    Comment: (NOTE) The Xpert Xpress SARS-CoV-2/FLU/RSV plus assay is intended as an aid in the diagnosis of influenza from Nasopharyngeal swab specimens and should not be used as a sole basis for treatment. Nasal washings and aspirates are unacceptable for Xpert Xpress SARS-CoV-2/FLU/RSV testing.  Fact Sheet for Patients: BloggerCourse.com  Fact Sheet for Healthcare Providers: SeriousBroker.it  This test is not yet approved or cleared by the [place] FDA and has been authorized for detection and/or diagnosis of SARS-CoV-2 by FDA under an Emergency Use Authorization (EUA). This EUA will remain in effect (meaning this test can be used) for the duration of the COVID-19 declaration under Section 564(b)(1) of the Act, 21 U.S.C. section 360bbb-3(b)(1), unless the authorization is terminated or revoked.     Resp Syncytial Virus by PCR NEGATIVE NEGATIVE Final    Comment: (NOTE) Fact Sheet for Patients: BloggerCourse.com  Fact Sheet for Healthcare Providers: SeriousBroker.it  This test is not yet approved or cleared by the [place] FDA and has been authorized for detection and/or diagnosis of SARS-CoV-2 by FDA under an Emergency Use Authorization (EUA). This EUA will remain in effect (meaning this test can be used) for the duration of the COVID-19 declaration under Section 564(b)(1) of the Act, 21 U.S.C. section 360bbb-3(b)(1), unless the authorization is terminated or revoked.  Performed at [facility], [address] [place], [place] [address]   Blood Culture (routine x 2)     Status: None   Collection Time: 12/03/22  6:59 PM   Specimen: BLOOD  Result Value Ref Range Status   Specimen Description BLOOD RIGHT ANTECUBITAL  Final   Special Requests   Final    BOTTLES DRAWN AEROBIC AND ANAEROBIC Blood Culture adequate volume    Culture  Setup Time   Final    GRAM NEGATIVE RODS IN BOTH AEROBIC AND ANAEROBIC BOTTLES CRITICAL VALUE NOTED.  VALUE IS CONSISTENT WITH PREVIOUSLY REPORTED AND CALLED VALUE.    Culture   Final    GRAM NEGATIVE RODS SUSCEPTIBILITIES PERFORMED ON PREVIOUS CULTURE WITHIN THE LAST 5 DAYS. Performed at [facility], [address], [place], [place] [address]    Report Status 12/07/2022 FINAL  Final  Blood Culture (routine x 2)     Status: Abnormal   Collection Time:  12/03/22  7:08 PM   Specimen: BLOOD RIGHT FOREARM  Result Value Ref Range Status   Specimen Description BLOOD RIGHT FOREARM  Final   Special Requests   Final    BOTTLES DRAWN AEROBIC AND ANAEROBIC Blood Culture results may not be optimal due to an excessive volume of blood received in culture bottles   Culture  Setup Time   Final    GRAM NEGATIVE RODS IN BOTH AEROBIC AND ANAEROBIC BOTTLES CRITICAL RESULT CALLED TO, READ BACK BY AND VERIFIED WITH: PHARMD J LEDFORD 12/04/22 @ 0714 BY AB Performed at [facility], [address] [place], [place] [address]    Culture KLEBSIELLA OXYTOCA ESCHERICHIA COLI  (A)  Final   Report Status 12/07/2022 FINAL  Final   Organism ID, Bacteria KLEBSIELLA OXYTOCA  Final   Organism ID, Bacteria KLEBSIELLA OXYTOCA  Final   Organism ID, Bacteria ESCHERICHIA COLI  Final   Organism ID, Bacteria ESCHERICHIA COLI  Final      Susceptibility   Escherichia coli - KIRBY BAUER*    CEFAZOLIN INTERMEDIATE Intermediate    Escherichia coli - MIC*    AMPICILLIN 8 SENSITIVE Sensitive     CEFEPIME <=0.12 SENSITIVE Sensitive     CEFTAZIDIME <=1 SENSITIVE Sensitive     CEFTRIAXONE <=0.25 SENSITIVE Sensitive     CIPROFLOXACIN <=0.25 SENSITIVE Sensitive     GENTAMICIN <=1 SENSITIVE Sensitive     IMIPENEM <=0.25 SENSITIVE Sensitive     TRIMETH/SULFA <=20 SENSITIVE Sensitive     AMPICILLIN/SULBACTAM 4 SENSITIVE Sensitive     PIP/TAZO <=4 SENSITIVE Sensitive ug/mL    * ESCHERICHIA COLI     ESCHERICHIA COLI   Klebsiella oxytoca - KIRBY BAUER*    CEFAZOLIN INTERMEDIATE Intermediate    Klebsiella oxytoca - MIC*    AMPICILLIN >=32 RESISTANT Resistant     CEFEPIME <=0.12 SENSITIVE Sensitive     CEFTAZIDIME <=1 SENSITIVE Sensitive     CEFTRIAXONE <=0.25 SENSITIVE Sensitive     CIPROFLOXACIN <=0.25 SENSITIVE Sensitive     GENTAMICIN <=1 SENSITIVE Sensitive     IMIPENEM <=0.25 SENSITIVE Sensitive     TRIMETH/SULFA <=20 SENSITIVE Sensitive     AMPICILLIN/SULBACTAM 8 SENSITIVE Sensitive     PIP/TAZO <=4 SENSITIVE Sensitive ug/mL    * KLEBSIELLA OXYTOCA    KLEBSIELLA OXYTOCA  Blood Culture ID Panel (Reflexed)     Status: Abnormal   Collection Time: 12/03/22  7:08 PM  Result Value Ref Range Status   Enterococcus faecalis NOT DETECTED NOT DETECTED Final   Enterococcus Faecium NOT DETECTED NOT DETECTED Final   Listeria monocytogenes NOT DETECTED NOT DETECTED Final   Staphylococcus species NOT DETECTED NOT DETECTED Final   Staphylococcus aureus (BCID) NOT DETECTED NOT DETECTED Final   Staphylococcus epidermidis NOT DETECTED NOT DETECTED Final   Staphylococcus lugdunensis NOT DETECTED NOT DETECTED Final   Streptococcus species NOT DETECTED NOT DETECTED Final   Streptococcus agalactiae NOT DETECTED NOT DETECTED Final   Streptococcus pneumoniae NOT DETECTED NOT DETECTED Final   Streptococcus pyogenes NOT DETECTED NOT DETECTED Final   A.calcoaceticus-baumannii NOT DETECTED NOT DETECTED Final   Bacteroides fragilis NOT DETECTED NOT DETECTED Final   Enterobacterales DETECTED (A) NOT DETECTED Final    Comment: CRITICAL RESULT CALLED TO, READ BACK BY AND VERIFIED WITH: PHARMD J LEDFORD 12/04/22 @ 0714 BY AB    Enterobacter cloacae complex NOT DETECTED NOT DETECTED Final   Escherichia coli DETECTED (A) NOT DETECTED Final    Comment: CRITICAL RESULT CALLED TO, READ BACK BY AND  VERIFIED WITH: PHARMD J LEDFORD 12/04/22 @ 0714 BY AB    Klebsiella aerogenes NOT DETECTED NOT DETECTED Final    Klebsiella oxytoca DETECTED (A) NOT DETECTED Final    Comment: CRITICAL RESULT CALLED TO, READ BACK BY AND VERIFIED WITH: PHARMD J LEDFORD 12/04/22 @ 0714 BY AB    Klebsiella pneumoniae NOT DETECTED NOT DETECTED Final   Proteus species NOT DETECTED NOT DETECTED Final   Salmonella species NOT DETECTED NOT DETECTED Final   Serratia marcescens NOT DETECTED NOT DETECTED Final   Haemophilus influenzae NOT DETECTED NOT DETECTED Final   Neisseria meningitidis NOT DETECTED NOT DETECTED Final   Pseudomonas aeruginosa NOT DETECTED NOT DETECTED Final   Stenotrophomonas maltophilia NOT DETECTED NOT DETECTED Final   Candida albicans NOT DETECTED NOT DETECTED Final   Candida auris NOT DETECTED NOT DETECTED Final   Candida glabrata NOT DETECTED NOT DETECTED Final   Candida krusei NOT DETECTED NOT DETECTED Final   Candida parapsilosis NOT DETECTED NOT DETECTED Final   Candida tropicalis NOT DETECTED NOT DETECTED Final   Cryptococcus neoformans/gattii NOT DETECTED NOT DETECTED Final   CTX-M ESBL NOT DETECTED NOT DETECTED Final   Carbapenem resistance IMP NOT DETECTED NOT DETECTED Final   Carbapenem resistance KPC NOT DETECTED NOT DETECTED Final   Carbapenem resistance NDM NOT DETECTED NOT DETECTED Final   Carbapenem resist OXA 48 LIKE NOT DETECTED NOT DETECTED Final   Carbapenem resistance VIM NOT DETECTED NOT DETECTED Final    Comment: Performed at [facility], [address] [place], [place] [address]  MRSA Next Gen by PCR, Nasal     Status: None   Collection Time: 12/03/22 11:14 PM   Specimen: Nasal Mucosa; Nasal Swab  Result Value Ref Range Status   MRSA by PCR Next Gen NOT DETECTED NOT DETECTED Final    Comment: (NOTE) The GeneXpert MRSA Assay (FDA approved for NASAL specimens only), is one component of a comprehensive MRSA colonization surveillance program. It is not intended to diagnose MRSA infection nor to guide or monitor treatment for MRSA infections. Test performance is not  FDA approved in patients less than 67 years old. Performed at [facility], [address], [place], [place] [address]          Radiology Studies: DG Chest Port 1 View  Result Date: 12/08/2022 CLINICAL DATA:  Dyspnea, fever EXAM: PORTABLE CHEST 1 VIEW COMPARISON:  12/06/2022 chest radiograph. FINDINGS: Low lung volumes. Stable cardiomediastinal silhouette with normal heart size. No pneumothorax. No pulmonary edema. Mild streaky hazy bibasilar lung opacities and slight blunting of the costophrenic angles bilaterally, improved on the right and stable on the left. IMPRESSION: Low lung volumes with mild streaky hazy bibasilar lung opacities and slight blunting of the costophrenic angles bilaterally, improved on the right and stable on the left. Favor atelectasis and possible small bilateral pleural effusions. A component of pneumonia cannot be excluded. Electronically Signed   By: Delbert Phenix M.D.   On: 12/08/2022 16:23        Scheduled Meds:  [UJW Hold] Chlorhexidine Gluconate Cloth  6 each Topical Daily   [MAR Hold] folic acid  1 mg Oral Daily   [MAR Hold] hydrALAZINE  25 mg Oral Q6H   [MAR Hold] insulin aspart  0-15 Units Subcutaneous Q4H   [MAR Hold] multivitamin with minerals  1 tablet Oral Daily   [MAR Hold] pantoprazole (PROTONIX) IV  40 mg Intravenous Q12H   [MAR Hold] QUEtiapine  100 mg Oral QHS   [MAR  Hold] thiamine  100 mg Oral Daily   Or   [MAR Hold] thiamine  100 mg Intravenous Daily   Continuous Infusions:  [MAR Hold] cefTRIAXone (ROCEPHIN)  IV 2 g (12/08/22 1007)   [MAR Hold] metronidazole 500 mg (12/08/22 2141)   [MAR Hold] potassium chloride 10 mEq (12/09/22 0815)     LOS: 6 days      Huey Bienenstock, MD [facility]   To contact the attending provider between 7A-7P or the covering provider during after hours 7P-7A, please log into the web site [email or web address] and access using universal [place] password for that web site. If you do not have  the password, please call the hospital operator.  12/09/2022, 10:34 AM

## 2022-12-09 NOTE — Progress Notes (Signed)
Inpatient Rehabilitation Admissions Coordinator   I will follow up tomorrow to discuss rehab venue options.  Ottie Glazier, RN, MSN Rehab Admissions Coordinator [phone] 12/09/2022 4:30 PM

## 2022-12-09 NOTE — Progress Notes (Signed)
Day of Surgery  Subjective: Patient much better today.  Not confused today.  Knows where he is, date, and situation, why he's here.  Sitting up in the chair.  Ate a great supper last night.  Minimal discomfort at best this morning.  ROS: See above, otherwise other systems negative  Objective: Vital signs in last 24 hours: Temp:  [97.6 F (36.4 C)-98.7 F (37.1 C)] 97.9 F (36.6 C) (11/05 0854) Pulse Rate:  [85-98] 89 (11/05 0854) Resp:  [16-18] 18 (11/05 0854) BP: (139-164)/(54-82) 146/63 (11/05 0854) SpO2:  [90 %-100 %] 97 % (11/05 0854) Weight:  [104.3 kg-110.8 kg] 104.3 kg (11/05 0854) Last BM Date : 12/04/22  Intake/Output from previous day: 11/04 0701 - 11/05 0700 In: 220 [P.O.:120; IV Piggyback:100] Out: 600 [Urine:600] Intake/Output this shift: No intake/output data recorded.  PE: Gen: pleasant, oriented, in a chair Heart: regular Lungs: CTAB Abd: soft, almost nontender in RUQ, obese, +BS   Lab Results:  Recent Labs    12/08/22 0458 12/09/22 0423  WBC 6.8 7.0  HGB 11.4* 11.8*  HCT 33.2* 33.7*  PLT 63* 90*   BMET Recent Labs    12/08/22 0458 12/09/22 0423  NA 139 138  K 3.5 3.2*  CL 109 109  CO2 21* 20*  GLUCOSE 155* 130*  BUN 15 13  CREATININE 1.01 0.97  CALCIUM 7.9* 7.9*   PT/INR Recent Labs    12/08/22 0834 12/09/22 0423  LABPROT 14.1 15.1  INR 1.1 1.2   CMP     Component Value Date/Time   NA 138 12/09/2022 0423   K 3.2 (L) 12/09/2022 0423   CL 109 12/09/2022 0423   CO2 20 (L) 12/09/2022 0423   GLUCOSE 130 (H) 12/09/2022 0423   BUN 13 12/09/2022 0423   CREATININE 0.97 12/09/2022 0423   CREATININE 0.95 11/19/2022 1052   CREATININE 1.13 06/04/2010 1354   CALCIUM 7.9 (L) 12/09/2022 0423   PROT 5.1 (L) 12/09/2022 0423   ALBUMIN 2.4 (L) 12/09/2022 0423   AST 25 12/09/2022 0423   AST 21 11/19/2022 1052   ALT 23 12/09/2022 0423   ALT 15 11/19/2022 1052   ALKPHOS 72 12/09/2022 0423   BILITOT 1.3 (H) 12/09/2022 0423   BILITOT  1.6 (H) 11/19/2022 1052   GFRNONAA >60 12/09/2022 0423   GFRNONAA >60 11/19/2022 1052   GFRAA >60 02/06/2019 0546   Lipase     Component Value Date/Time   LIPASE 41 12/02/2022 0446       Studies/Results: DG Chest Port 1 View  Result Date: 12/08/2022 CLINICAL DATA:  Dyspnea, fever EXAM: PORTABLE CHEST 1 VIEW COMPARISON:  12/06/2022 chest radiograph. FINDINGS: Low lung volumes. Stable cardiomediastinal silhouette with normal heart size. No pneumothorax. No pulmonary edema. Mild streaky hazy bibasilar lung opacities and slight blunting of the costophrenic angles bilaterally, improved on the right and stable on the left. IMPRESSION: Low lung volumes with mild streaky hazy bibasilar lung opacities and slight blunting of the costophrenic angles bilaterally, improved on the right and stable on the left. Favor atelectasis and possible small bilateral pleural effusions. A component of pneumonia cannot be excluded. Electronically Signed   By: Delbert Phenix M.D.   On: 12/08/2022 16:23    Anti-infectives: Anti-infectives (From admission, onward)    Start     Dose/Rate Route Frequency Ordered Stop   12/08/22 0645  [MAR Hold]  metroNIDAZOLE (FLAGYL) IVPB 500 mg        (MAR Hold since Tue 12/09/2022 at 0853.Hold Reason:  Transfer to a Procedural area)   500 mg 100 mL/hr over 60 Minutes Intravenous Every 12 hours 12/08/22 0639     12/06/22 0800  doxycycline (VIBRAMYCIN) 100 mg in dextrose 5 % 250 mL IVPB  Status:  Discontinued        100 mg 125 mL/hr over 120 Minutes Intravenous 2 times daily 12/06/22 0620 12/08/22 0641   12/05/22 1145  cefTRIAXone (ROCEPHIN) 2 g in sodium chloride 0.9 % 100 mL IVPB        2 g 200 mL/hr over 30 Minutes Intravenous To Radiology 12/05/22 1059 12/05/22 1215   12/04/22 0945  [MAR Hold]  cefTRIAXone (ROCEPHIN) 2 g in sodium chloride 0.9 % 100 mL IVPB        (MAR Hold since Tue 12/09/2022 at 0853.Hold Reason: Transfer to a Procedural area)   2 g 200 mL/hr over 30 Minutes  Intravenous Every 24 hours 12/04/22 0849     12/04/22 0600  piperacillin-tazobactam (ZOSYN) IVPB 3.375 g  Status:  Discontinued        3.375 g 12.5 mL/hr over 240 Minutes Intravenous Every 8 hours 12/03/22 2231 12/04/22 0849   12/03/22 2231  vancomycin variable dose per unstable renal function (pharmacist dosing)  Status:  Discontinued         Does not apply See admin instructions 12/03/22 2231 12/04/22 0849   12/03/22 1900  ceFEPIme (MAXIPIME) 2 g in sodium chloride 0.9 % 100 mL IVPB        2 g 200 mL/hr over 30 Minutes Intravenous  Once 12/03/22 1849 12/03/22 2025   12/03/22 1900  metroNIDAZOLE (FLAGYL) IVPB 500 mg        500 mg 100 mL/hr over 60 Minutes Intravenous  Once 12/03/22 1849 12/03/22 2131   12/03/22 1900  vancomycin (VANCOCIN) IVPB 1000 mg/200 mL premix  Status:  Discontinued        1,000 mg 200 mL/hr over 60 Minutes Intravenous  Once 12/03/22 1849 12/03/22 1851   12/03/22 1900  vancomycin (VANCOCIN) 2,500 mg in sodium chloride 0.9 % 500 mL IVPB        2,500 mg 262.5 mL/hr over 120 Minutes Intravenous  Once 12/03/22 1853 12/03/22 2256        Assessment/Plan Acute calculous cholecystitis - plan for IR perc chole drain today -patient overall improving with minimal pain -WBC normal and LFTs stable -cont abx therapy -may continue diet when returns from IR -d/w IR at bedside  FEN - NPO for procedure today, may resume diet after procedure VTE - on hold due to thrombocytopenia, cirrhosis ID - rocephin/Flagyl  Cirrhosis CLL DM Portal HTN WPW syndrome  I reviewed Consultant IR notes, hospitalist notes, last 24 h vitals and pain scores, last 48 h intake and output, last 24 h labs and trends, and last 24 h imaging results.   LOS: 6 days    Letha Cape , [facility] Surgery 12/09/2022, 9:04 AM Please see Amion for pager number during day hours 7:00am-4:30pm or 7:00am -11:30am on weekends

## 2022-12-09 NOTE — Progress Notes (Signed)
SLP Cancellation Note  Patient Details Name: MARQ REBELLO MRN: [id number] DOB: 1945/03/05   Cancelled treatment:       Reason Eval/Treat Not Completed: Patient at procedure or test/unavailable SLP will follow for readiness.  Angela Nevin, MA, CCC-SLP Speech Therapy

## 2022-12-09 NOTE — Progress Notes (Signed)
CCC Pre-op Review  Pre-op checklist: incomplete  NPO:  since MN  Labs:  11/5 0423  k 3.2*  glu 130  Ca 7.9  Alb 2.4     h/h 11.8 / 33.7  PLTS 90*   inr 1.2  Consent:  *confused wife will sign *  H&P:   not complete  Vitals:   @0400   NO temp recorded  p98 r not recorded  150/72  RA sat 100  O2 requirements:   RA  MAR/PTA review:   see below  IV:   22g r hand  Floor nurse name:    not recorded  [id number]  Additional info:   Rocephin q24h  IV Flagyl q12h   Haldo IV for agitation  K 3.2 -> has 3 runs ordered for replacement this am

## 2022-12-10 ENCOUNTER — Encounter (HOSPITAL_COMMUNITY): Payer: Self-pay | Admitting: Radiology

## 2022-12-10 ENCOUNTER — Other Ambulatory Visit (HOSPITAL_COMMUNITY): Payer: Self-pay

## 2022-12-10 DIAGNOSIS — E119 Type 2 diabetes mellitus without complications: Secondary | ICD-10-CM

## 2022-12-10 DIAGNOSIS — K819 Cholecystitis, unspecified: Secondary | ICD-10-CM

## 2022-12-10 DIAGNOSIS — Z794 Long term (current) use of insulin: Secondary | ICD-10-CM

## 2022-12-10 DIAGNOSIS — A419 Sepsis, unspecified organism: Secondary | ICD-10-CM | POA: Diagnosis not present

## 2022-12-10 DIAGNOSIS — R6521 Severe sepsis with septic shock: Secondary | ICD-10-CM | POA: Diagnosis not present

## 2022-12-10 LAB — COMPREHENSIVE METABOLIC PANEL
ALT: 20 U/L (ref 0–44)
AST: 18 U/L (ref 15–41)
Albumin: 2.3 g/dL — ABNORMAL LOW (ref 3.5–5.0)
Alkaline Phosphatase: 71 U/L (ref 38–126)
Anion gap: 8 (ref 5–15)
BUN: 15 mg/dL (ref 8–23)
CO2: 22 mmol/L (ref 22–32)
Calcium: 8 mg/dL — ABNORMAL LOW (ref 8.9–10.3)
Chloride: 109 mmol/L (ref 98–111)
Creatinine, Ser: 1.01 mg/dL (ref 0.61–1.24)
GFR, Estimated: >60 mL/min (ref >60)
Glucose, Bld: 135 mg/dL — ABNORMAL HIGH (ref 70–99)
Potassium: 3.6 mmol/L (ref 3.5–5.1)
Sodium: 139 mmol/L (ref 135–145)
Total Bilirubin: 1 mg/dL (ref <1.2)
Total Protein: 4.9 g/dL — ABNORMAL LOW (ref 6.5–8.1)

## 2022-12-10 LAB — CBC
HCT: 32.4 % — ABNORMAL LOW (ref 39.0–52.0)
Hemoglobin: 11.4 g/dL — ABNORMAL LOW (ref 13.0–17.0)
MCH: 32.7 pg (ref 26.0–34.0)
MCHC: 35.2 g/dL (ref 30.0–36.0)
MCV: 92.8 fL (ref 80.0–100.0)
Platelets: 108 10*3/uL — ABNORMAL LOW (ref 150–400)
RBC: 3.49 MIL/uL — ABNORMAL LOW (ref 4.22–5.81)
RDW: 14.3 % (ref 11.5–15.5)
WBC: 6.5 10*3/uL (ref 4.0–10.5)
nRBC: 0 % (ref 0.0–0.2)

## 2022-12-10 LAB — GLUCOSE, CAPILLARY
Glucose-Capillary: 124 mg/dL — ABNORMAL HIGH (ref 70–99)
Glucose-Capillary: 174 mg/dL — ABNORMAL HIGH (ref 70–99)

## 2022-12-10 MED ORDER — TOUJEO MAX SOLOSTAR 300 UNIT/ML ~~LOC~~ SOPN: 12.0000 [IU] | PEN_INJECTOR | Freq: Every day | SUBCUTANEOUS | Status: DC

## 2022-12-10 MED ORDER — AMOXICILLIN-POT CLAVULANATE 875-125 MG PO TABS
1.0000 | ORAL_TABLET | Freq: Two times a day (BID) | ORAL | 0 refills | Status: AC
  Filled 2022-12-10: qty 10, 5d supply, fill #0

## 2022-12-10 MED ORDER — AMOXICILLIN-POT CLAVULANATE 875-125 MG PO TABS
1.0000 | ORAL_TABLET | Freq: Two times a day (BID) | ORAL | Status: DC
  Administered 2022-12-10: 1 via ORAL
  Filled 2022-12-10: qty 1

## 2022-12-10 NOTE — Plan of Care (Signed)
  Problem: Education: Goal: Ability to describe self-care measures that may prevent or decrease complications (Diabetes Survival Skills Education) will improve Outcome: Progressing Goal: Individualized Educational Video(s) Outcome: Progressing   Problem: Coping: Goal: Ability to adjust to condition or change in health will improve Outcome: Progressing   Problem: Fluid Volume: Goal: Ability to maintain a balanced intake and output will improve Outcome: Progressing   Problem: Health Behavior/Discharge Planning: Goal: Ability to identify and utilize available resources and services will improve Outcome: Progressing Goal: Ability to manage health-related needs will improve Outcome: Progressing   Problem: Metabolic: Goal: Ability to maintain appropriate glucose levels will improve Outcome: Progressing   Problem: Nutritional: Goal: Maintenance of adequate nutrition will improve Outcome: Progressing Goal: Progress toward achieving an optimal weight will improve Outcome: Progressing   Problem: Skin Integrity: Goal: Risk for impaired skin integrity will decrease Outcome: Progressing   Problem: Tissue Perfusion: Goal: Adequacy of tissue perfusion will improve Outcome: Progressing   Problem: Education: Goal: Knowledge of General Education information will improve Description: Including pain rating scale, medication(s)/side effects and non-pharmacologic comfort measures Outcome: Progressing   Problem: Health Behavior/Discharge Planning: Goal: Ability to manage health-related needs will improve Outcome: Progressing   Problem: Clinical Measurements: Goal: Ability to maintain clinical measurements within normal limits will improve Outcome: Progressing Goal: Will remain free from infection Outcome: Progressing Goal: Diagnostic test results will improve Outcome: Progressing Goal: Respiratory complications will improve Outcome: Progressing Goal: Cardiovascular complication will  be avoided Outcome: Progressing   Problem: Activity: Goal: Risk for activity intolerance will decrease Outcome: Progressing   Problem: Nutrition: Goal: Adequate nutrition will be maintained Outcome: Progressing   Problem: Coping: Goal: Level of anxiety will decrease Outcome: Progressing   Problem: Elimination: Goal: Will not experience complications related to bowel motility Outcome: Progressing Goal: Will not experience complications related to urinary retention Outcome: Progressing   Problem: Pain Management: Goal: General experience of comfort will improve Outcome: Progressing   Problem: Safety: Goal: Ability to remain free from injury will improve Outcome: Progressing   Problem: Skin Integrity: Goal: Risk for impaired skin integrity will decrease Outcome: Progressing   Problem: Safety: Goal: Non-violent Restraint(s) Outcome: Progressing

## 2022-12-10 NOTE — Progress Notes (Signed)
1 Day Post-Op  Subjective: Patient doing well today after perc chole drain yesterday.  Ate some yesterday with no issues.    ROS: See above, otherwise other systems negative  Objective: Vital signs in last 24 hours: Temp:  [97 F (36.1 C)-97.9 F (36.6 C)] 97.9 F (36.6 C) (11/06 0319) Pulse Rate:  [80-103] 81 (11/06 0319) Resp:  [12-18] 16 (11/06 0319) BP: (125-146)/(54-64) 139/58 (11/06 0319) SpO2:  [94 %-100 %] 100 % (11/06 0319) Weight:  [104.3 kg-104.7 kg] 104.7 kg (11/06 0500) Last BM Date : 12/04/22  Intake/Output from previous day: 11/05 0701 - 11/06 0700 In: 900 [P.O.:500; IV Piggyback:400] Out: 1130 [Urine:1020; Drains:110] Intake/Output this shift: No intake/output data recorded.  PE: Gen: pleasant, oriented Heart: regular Lungs: CTAB Abd: soft, minimally tender around perc chole drain, perc drain with minimal bilious output right now, +BS   Lab Results:  Recent Labs    12/09/22 0423 12/10/22 0335  WBC 7.0 6.5  HGB 11.8* 11.4*  HCT 33.7* 32.4*  PLT 90* 108*   BMET Recent Labs    12/09/22 0423 12/10/22 0335  NA 138 139  K 3.2* 3.6  CL 109 109  CO2 20* 22  GLUCOSE 130* 135*  BUN 13 15  CREATININE 0.97 1.01  CALCIUM 7.9* 8.0*   PT/INR Recent Labs    12/08/22 0834 12/09/22 0423  LABPROT 14.1 15.1  INR 1.1 1.2   CMP     Component Value Date/Time   NA 139 12/10/2022 0335   K 3.6 12/10/2022 0335   CL 109 12/10/2022 0335   CO2 22 12/10/2022 0335   GLUCOSE 135 (H) 12/10/2022 0335   BUN 15 12/10/2022 0335   CREATININE 1.01 12/10/2022 0335   CREATININE 0.95 11/19/2022 1052   CREATININE 1.13 06/04/2010 1354   CALCIUM 8.0 (L) 12/10/2022 0335   PROT 4.9 (L) 12/10/2022 0335   ALBUMIN 2.3 (L) 12/10/2022 0335   AST 18 12/10/2022 0335   AST 21 11/19/2022 1052   ALT 20 12/10/2022 0335   ALT 15 11/19/2022 1052   ALKPHOS 71 12/10/2022 0335   BILITOT 1.0 12/10/2022 0335   BILITOT 1.6 (H) 11/19/2022 1052   GFRNONAA >60 12/10/2022 0335    GFRNONAA >60 11/19/2022 1052   GFRAA >60 02/06/2019 0546   Lipase     Component Value Date/Time   LIPASE 41 12/02/2022 0446       Studies/Results: IR Perc Cholecystostomy  Result Date: 12/09/2022 INDICATION: 77 year old male presents for percutaneous cholecystostomy EXAM: CHOLECYSTOSTOMY MEDICATIONS: Cefoxitin; The antibiotic was administered within an appropriate time frame prior to the initiation of the procedure. ANESTHESIA/SEDATION: The anesthesia team was present to provide MAC anesthesia and for patient monitoring during the procedure. Intubation/airway management was performed in interventional radiology. Interventional radiology nursing staff was also present. FLUOROSCOPY TIME:  Fluoroscopy Time:  (4 mGy). COMPLICATIONS: None PROCEDURE: Informed written consent was obtained from the patient and the patient's family after a thorough discussion of the procedural risks, benefits and alternatives. All questions were addressed. Maximal Sterile Barrier Technique was utilized including caps, mask, sterile gowns, sterile gloves, sterile drape, hand hygiene and skin antiseptic. A timeout was performed prior to the initiation of the procedure. Ultrasound survey of the right upper quadrant was performed for planning purposes. Once the patient is prepped and draped in the usual sterile fashion, the skin and subcutaneous tissues overlying the gallbladder were generously infiltrated 1% lidocaine for local anesthesia. A coaxial needle was advanced under ultrasound guidance through the skin subcutaneous  tissues and a small segment of liver into the gallbladder lumen. With removal of the stylet, spontaneous dark bile drainage occurred. Using modified Seldinger technique, a 10 French drain was placed into the gallbladder fossa, with aspiration of the sample for the lab. Contrast injection confirmed position of the tube within the gallbladder lumen. Drainage catheter was attached to gravity drain with a  suture retention placed. Patient tolerated the procedure well and remained hemodynamically stable throughout. No complications were encountered and no significant blood loss encountered. IMPRESSION: Status post image guided percutaneous cholecystostomy. Signed, Yvone Neu. Miachel Roux, RPVI [facility] Radiology Electronically Signed   By: Gilmer Mor D.O.   On: 12/09/2022 12:11   DG Chest Port 1 View  Result Date: 12/08/2022 CLINICAL DATA:  Dyspnea, fever EXAM: PORTABLE CHEST 1 VIEW COMPARISON:  12/06/2022 chest radiograph. FINDINGS: Low lung volumes. Stable cardiomediastinal silhouette with normal heart size. No pneumothorax. No pulmonary edema. Mild streaky hazy bibasilar lung opacities and slight blunting of the costophrenic angles bilaterally, improved on the right and stable on the left. IMPRESSION: Low lung volumes with mild streaky hazy bibasilar lung opacities and slight blunting of the costophrenic angles bilaterally, improved on the right and stable on the left. Favor atelectasis and possible small bilateral pleural effusions. A component of pneumonia cannot be excluded. Electronically Signed   By: Delbert Phenix M.D.   On: 12/08/2022 16:23    Anti-infectives: Anti-infectives (From admission, onward)    Start     Dose/Rate Route Frequency Ordered Stop   12/08/22 0645  metroNIDAZOLE (FLAGYL) IVPB 500 mg        500 mg 100 mL/hr over 60 Minutes Intravenous Every 12 hours 12/08/22 0639     12/06/22 0800  doxycycline (VIBRAMYCIN) 100 mg in dextrose 5 % 250 mL IVPB  Status:  Discontinued        100 mg 125 mL/hr over 120 Minutes Intravenous 2 times daily 12/06/22 0620 12/08/22 0641   12/05/22 1145  cefTRIAXone (ROCEPHIN) 2 g in sodium chloride 0.9 % 100 mL IVPB        2 g 200 mL/hr over 30 Minutes Intravenous To Radiology 12/05/22 1059 12/05/22 1215   12/04/22 0945  cefTRIAXone (ROCEPHIN) 2 g in sodium chloride 0.9 % 100 mL IVPB        2  g 200 mL/hr over 30 Minutes Intravenous Every 24 hours 12/04/22 0849     12/04/22 0600  piperacillin-tazobactam (ZOSYN) IVPB 3.375 g  Status:  Discontinued        3.375 g 12.5 mL/hr over 240 Minutes Intravenous Every 8 hours 12/03/22 2231 12/04/22 0849   12/03/22 2231  vancomycin variable dose per unstable renal function (pharmacist dosing)  Status:  Discontinued         Does not apply See admin instructions 12/03/22 2231 12/04/22 0849   12/03/22 1900  ceFEPIme (MAXIPIME) 2 g in sodium chloride 0.9 % 100 mL IVPB        2 g 200 mL/hr over 30 Minutes Intravenous  Once 12/03/22 1849 12/03/22 2025   12/03/22 1900  metroNIDAZOLE (FLAGYL) IVPB 500 mg        500 mg 100 mL/hr over 60 Minutes Intravenous  Once 12/03/22 1849 12/03/22 2131   12/03/22 1900  vancomycin (VANCOCIN) IVPB 1000 mg/200 mL premix  Status:  Discontinued        1,000 mg 200 mL/hr over 60 Minutes Intravenous  Once 12/03/22 1849 12/03/22 1851   12/03/22 1900  vancomycin (VANCOCIN) 2,500 mg in sodium chloride 0.9 % 500 mL IVPB        2,500 mg 262.5 mL/hr over 120 Minutes Intravenous  Once 12/03/22 1853 12/03/22 2256        Assessment/Plan Acute calculous cholecystitis with Enterobacter, Klebsiella, E. Coli bacter - IR drain yesterday -patient overall improving with minimal pain -WBC normal and LFTs stable -cont abx therapy, per medicine based on bacteremia -cont diet -will have patient follow up with Dr. Bedelia Person in 6 weeks -surgically stable for DC home when medically felt stable  FEN - HH diet VTE - on hold due to thrombocytopenia, cirrhosis ID - rocephin/Flagyl, likely transition to Augmentin for further treatment of cholecystitis and bacteremia  Cirrhosis CLL DM Portal HTN WPW syndrome  I reviewed Consultant IR notes, hospitalist notes, last 24 h vitals and pain scores, last 48 h intake and output, last 24 h labs and trends, and last 24 h imaging results.   LOS: 7 days    Letha Cape , [facility] Surgery 12/10/2022, 8:15 AM Please see Amion for pager number during day hours 7:00am-4:30pm or 7:00am -11:30am on weekends

## 2022-12-10 NOTE — Care Management Important Message (Signed)
Important Message  Patient Details  Name: Patrick Kidd MRN: [id number] Date of Birth: 12-14-1945   Important Message Given:  Yes - Medicare IM  Patient left prior to IM delivery will mail a copy to the patient home address.   Brett Soza 12/10/2022, 1:40 PM

## 2022-12-10 NOTE — Consult Note (Signed)
Value[facility]Based [facility] Liaison Consult Note    12/10/2022  Patrick Kidd 1945/06/05 [id number]  Value-Based Care Institute [VBCI]:   Insurance: Medicare ACO REACH   Primary Care Provider: Etta Grandchild, MD with [facility], this provider is listed for the transition of care follow up appointments  and [facility] calls   [facility] Liaison met patient at bedside at [facility]. However, patient has already transitioned home with [facility] noted.   The patient was screened for 6 days LOS readmission risk prevention and hospitalization with noted high risk score for unplanned readmission risk.  The patient was assessed for potential Community Care Coordination service needs for post hospital transition for care coordination.  Plan:   Referral request for community care coordination: No additional needs assessed and to be addressed by [facility] Community TOC team.   [facility] Management/Population Health does not replace or interfere with any arrangements made by the Inpatient Transition of Care team.   For questions contact:   Charlesetta Shanks, RN, BSN, CCM [place]  [facility] Liaison Direct Dial: [phone] or secure chat Email: [email or web address]

## 2022-12-10 NOTE — TOC Transition Note (Signed)
Transition of Care [facility]) - CM/SW Discharge Note   Patient Details  Name: Patrick Kidd MRN: [id number] Date of Birth: [birth year]  [facility] CM/SW Contact:  Gordy Clement, RN Phone Number: 12/10/2022, 9:08 AM   Clinical Narrative:     Patient will DC to home today with Wife.  Patient will have HH for PT /OT and SN- [facility] will provide services.  AVS updated.  No additional TOC needs       Barriers to Discharge: Continued Medical Work up   Patient Goals and CMS Choice      Discharge Placement                         Discharge Plan and Services Additional resources added to the After Visit Summary for   In-house Referral: Clinical Social Work                                   Social Determinants of Health (SDOH) Interventions SDOH Screenings   Food Insecurity: No Food Insecurity (12/06/2022)  Housing: Low Risk  (12/06/2022)  Transportation Needs: No Transportation Needs (12/06/2022)  Utilities: Not At Risk (12/06/2022)  Alcohol Screen: Low Risk  (12/02/2021)  Depression (PHQ2-9): Low Risk  (12/02/2021)  Financial Resource Strain: Low Risk  (12/02/2021)  Physical Activity: Inactive (12/02/2021)  Social Connections: Moderately Integrated (12/02/2021)  Stress: No Stress Concern Present (12/02/2021)  Tobacco Use: Medium Risk (12/09/2022)     Readmission Risk Interventions     No data to display

## 2022-12-10 NOTE — Anesthesia Postprocedure Evaluation (Signed)
Anesthesia Post Note  Patient: Patrick Kidd  Procedure(s) Performed: IR WITH ANESTHESIA     Patient location during evaluation: PACU Anesthesia Type: MAC Level of consciousness: awake and alert Pain management: pain level controlled Vital Signs Assessment: post-procedure vital signs reviewed and stable Respiratory status: spontaneous breathing, nonlabored ventilation, respiratory function stable and patient connected to nasal cannula oxygen Cardiovascular status: stable and blood pressure returned to baseline Postop Assessment: no apparent nausea or vomiting Anesthetic complications: no   No notable events documented.  Last Vitals:  Vitals:   12/09/22 2327 12/10/22 0319  BP: (!) 125/54 (!) 139/58  Pulse: (!) 103 81  Resp: 17 16  Temp: 36.6 C 36.6 C  SpO2: 98% 100%    Last Pain:  Vitals:   12/10/22 0400  TempSrc:   PainSc: 0-No pain                 Earl Lites P Stanislaus Kaltenbach

## 2022-12-10 NOTE — Discharge Summary (Signed)
PATIENT DETAILS Name: Patrick Kidd Age: 77 y.o. Sex: male Date of Birth: [birth year] MRN: [id number]. Admitting Physician: Patrick Arms, MD [name], [name], MD  Admit Date: 12/03/2022 Discharge date: 12/10/2022  Recommendations for Outpatient Follow-up:  Follow up with PCP in 1-2 weeks Please obtain CMP/CBC in one week Please ensure follow up with general surgery, radiology  Admitted From:  Home  Disposition: Home health   Discharge Condition: good  CODE STATUS:   Code Status: Full Code   Diet recommendation:  Diet Order             Diet - low sodium heart healthy           Diet Heart Room service appropriate? Yes; Fluid consistency: Thin  Diet effective now                    Brief Summary: 77 year old with history of EtOH cirrhosis-prior history of cholecystitis requiring drain placement-presented to the hospital with septic shock due to acute acalculous cholecystitis in the setting of E. coli/Klebsiella bacteremia.  Patient was initially admitted to the ICU-has required pressors-stabilized and transferred to [facility].  He underwent cholecystostomy drain placement.  Brief Hospital Course: Recurrent acute calculus cholecystitis with E. coli/Klebsiella bacteremia Clinically improved-briefly required pressors in the ICU-empirically treated with broad-spectrum antibiotics-evaluated by general surgery/IR-rapidly improved-underwent drain placement on 11/5.  Reevaluated by general surgery today-tolerating advancement in diet-minimal pain- patient himself requesting discharge-okay with general surgery for discharge today-will transition him to Augmentin for few more days-looks like he has had already 7 days of IV antibiotics in the hospital.  Discussed with IR-they will provide discharge recommendations/instructions.  Patient will follow-up with general surgery in the outpatient setting.  AKI Hemodynamically mediated-resolved with supportive  care.  Hypokalemia/hypophosphatemia/hypomagnesemia Repleted  Chronic HFpEF Euvolemic Does not appear to be on diuretics on discharge-monitor closely in the outpatient setting and restart diuretics accordingly.  DM-2 CBG stable here and just SSI-normally takes 50 units of long-acting insulin daily-discussed with patient-will decrease his long-acting insulin to 12 units daily on discharge-he will keep a record of his CBGs at home-and talk to his primary care practitioner to gradually increase his insulin regimen based on his CBGs.  Cirrhosis Thrombocytopenia Compensated-mild thrombocytopenia.  Pressure Ulcer: Agree with assessment and plan as outlined below Pressure Injury 12/03/22 Buttocks Mid;Right;Left Stage 1 -  Intact skin with non-blanchable redness of a localized area usually over a bony prominence. (Active)  12/03/22 2330  Location: Buttocks  Location Orientation: Mid;Right;Left  Staging: Stage 1 -  Intact skin with non-blanchable redness of a localized area usually over a bony prominence.  Wound Description (Comments):   Present on Admission: Yes  Dressing Type Foam - Lift dressing to assess site every shift 12/09/22 2000   BMI: Estimated body mass index is 28.85 kg/m as calculated from the following:   Height as of this encounter: 6\' 3"  (1.905 m).   Weight as of this encounter: 104.7 kg.    Discharge Diagnoses:  Principal Problem:   Septic shock [facility])  Discharge Instructions:  Activity:  As tolerated with Full fall precautions use walker/cane & assistance as needed   Discharge Instructions     Call MD for:  extreme fatigue   Complete by: As directed    Call MD for:  persistant dizziness or light-headedness   Complete by: As directed    Call MD for:  persistant nausea and vomiting   Complete by: As directed    Diet - low  sodium heart healthy   Complete by: As directed    Discharge instructions   Complete by: As directed    Follow with Primary MD  Etta Grandchild, MD in 1-2 weeks  Follow-up with general surgery and interventional radiology clinic as instructed.  Check your blood sugar several times a day and please keep a record of your blood sugars-take it to your next appointment with your primary care practitioner.  Please get a complete blood count and chemistry panel checked by your Primary MD at your next visit, and again as instructed by your Primary MD.  Get Medicines reviewed and adjusted: Please take all your medications with you for your next visit with your Primary MD  Laboratory/radiological data: Please request your Primary MD to go over all hospital tests and procedure/radiological results at the follow up, please ask your Primary MD to get all Hospital records sent to his/her office.  In some cases, they will be blood work, cultures and biopsy results pending at the time of your discharge. Please request that your primary care M.D. follows up on these results.  Also Note the following: If you experience worsening of your admission symptoms, develop shortness of breath, life threatening emergency, suicidal or homicidal thoughts you must seek medical attention immediately by calling 911 or calling your MD immediately  if symptoms less severe.  You must read complete instructions/literature along with all the possible adverse reactions/side effects for all the Medicines you take and that have been prescribed to you. Take any new Medicines after you have completely understood and accpet all the possible adverse reactions/side effects.   Do not drive when taking Pain medications or sleeping medications (Benzodaizepines)  Do not take more than prescribed Pain, Sleep and Anxiety Medications. It is not advisable to combine anxiety,sleep and pain medications without talking with your primary care practitioner  Special Instructions: If you have smoked or chewed Tobacco  in the last 2 yrs please stop smoking, stop any regular Alcohol   and or any Recreational drug use.  Wear Seat belts while driving.  Please note: You were cared for by a hospitalist during your hospital stay. Once you are discharged, your primary care physician will handle any further medical issues. Please note that NO REFILLS for any discharge medications will be authorized once you are discharged, as it is imperative that you return to your primary care physician (or establish a relationship with a primary care physician if you do not have one) for your post hospital discharge needs so that they can reassess your need for medications and monitor your lab values.   Increase activity slowly   Complete by: As directed    No wound care   Complete by: As directed       Allergies as of 12/10/2022       Reactions   Rifaximin Nausea And Vomiting   Required EMS visit, although patient did not go to hospital   Beta Adrenergic Blockers    Likely WPW.  Use with caution   Calcium Channel Blockers    Likely WPW.  Use with caution.        Medication List     TAKE these medications    accu-chek multiclix lancets Use to check sugar daily. DX: E11.8   acetaminophen 500 MG tablet Commonly known as: TYLENOL Take 1 tablet (500 mg total) by mouth every 6 (six) hours as needed. What changed:  how much to take reasons to take this   amoxicillin-clavulanate  875-125 MG tablet Commonly known as: AUGMENTIN Take 1 tablet by mouth every 12 (twelve) hours for 5 days.   Centrum Silver 50+Men Tabs Take 1 tablet by mouth daily.   FreeStyle Libre 3 Reader Devi 1 Act by Does not apply route daily.   FreeStyle Libre 3 Sensor Misc 1 Act by Does not apply route daily. Place 1 sensor on the skin every 14 days. Use to check glucose continuously   gabapentin 300 MG capsule Commonly known as: NEURONTIN Take 1 capsule (300 mg total) by mouth 3 (three) times daily.   HYDROcodone-acetaminophen 5-325 MG tablet Commonly known as: NORCO/VICODIN Take 1 tablet by mouth  every 6 (six) hours as needed for severe pain (pain score 7-10).   metFORMIN 500 MG tablet Commonly known as: GLUCOPHAGE TAKE 1 TABLET BY MOUTH 2 TIMES DAILY WITH A MEAL.   pantoprazole 40 MG tablet Commonly known as: PROTONIX Take 1 tablet (40 mg total) by mouth 2 (two) times daily. TAKE 1 TABLET 30 MINUTES BEFORE MEALS (BREAKFAST AND SUPPER) TWICE A DAY   Pen Needles 32G X 5 MM Misc Inject 1 Act into the skin daily. Use to administer insulin. Dx E11.9 Dispense based on insurance preference.   Toujeo Max SoloStar 300 UNIT/ML Solostar Pen Generic drug: insulin glargine (2 Unit Dial) Inject 12 Units into the skin daily. Via [facility] pt assistance What changed: how much to take        Follow-up Information     Diamantina Monks, MD Follow up in 6 week(s).   Specialty: Surgery Contact information: [address] [facility] [place] [address] [phone]                Allergies  Allergen Reactions   Rifaximin Nausea And Vomiting    Required EMS visit, although patient did not go to hospital   Beta Adrenergic Blockers     Likely WPW.  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  Use with caution.     Other Procedures/Studies: IR Perc Cholecystostomy  Result Date: 12/09/2022 INDICATION: 77 year old male presents for percutaneous cholecystostomy EXAM: CHOLECYSTOSTOMY MEDICATIONS: Cefoxitin; The antibiotic was administered within an appropriate time frame prior to the initiation of the procedure. ANESTHESIA/SEDATION: The anesthesia team was present to provide MAC anesthesia and for patient monitoring during the procedure. Intubation/airway management was performed in interventional radiology. Interventional radiology nursing staff was also present. FLUOROSCOPY TIME:  Fluoroscopy Time:  (4 mGy). COMPLICATIONS: None PROCEDURE: Informed written consent was obtained from the patient and the patient's family after a thorough  discussion of the procedural risks, benefits and alternatives. All questions were addressed. Maximal Sterile Barrier Technique was utilized including caps, mask, sterile gowns, sterile gloves, sterile drape, hand hygiene and skin antiseptic. A timeout was performed prior to the initiation of the procedure. Ultrasound survey of the right upper quadrant was performed for planning purposes. Once the patient is prepped and draped in the usual sterile fashion, the skin and subcutaneous tissues overlying the gallbladder were generously infiltrated 1% lidocaine for local anesthesia. A coaxial needle was advanced under ultrasound guidance through the skin subcutaneous tissues and a small segment of liver into the gallbladder lumen. With removal of the stylet, spontaneous dark bile drainage occurred. Using modified Seldinger technique, a 10 French drain was placed into the gallbladder fossa, with aspiration of the sample for the lab. Contrast injection confirmed position of the tube within the gallbladder lumen. Drainage catheter was  attached to gravity drain with a suture retention placed. Patient tolerated the procedure well and remained hemodynamically stable throughout. No complications were encountered and no significant blood loss encountered. IMPRESSION: Status post image guided percutaneous cholecystostomy. Signed, Yvone Neu. Miachel Roux, RPVI [facility] Radiology Electronically Signed   By: Gilmer Mor D.O.   On: 12/09/2022 12:11   DG Chest Port 1 View  Result Date: 12/08/2022 CLINICAL DATA:  Dyspnea, fever EXAM: PORTABLE CHEST 1 VIEW COMPARISON:  12/06/2022 chest radiograph. FINDINGS: Low lung volumes. Stable cardiomediastinal silhouette with normal heart size. No pneumothorax. No pulmonary edema. Mild streaky hazy bibasilar lung opacities and slight blunting of the costophrenic angles bilaterally, improved on the right and stable on the left. IMPRESSION:  Low lung volumes with mild streaky hazy bibasilar lung opacities and slight blunting of the costophrenic angles bilaterally, improved on the right and stable on the left. Favor atelectasis and possible small bilateral pleural effusions. A component of pneumonia cannot be excluded. Electronically Signed   By: Delbert Phenix M.D.   On: 12/08/2022 16:23   ECHOCARDIOGRAM COMPLETE  Result Date: 12/06/2022    ECHOCARDIOGRAM REPORT   Patient Name:   GAURAV BALDREE Date of Exam: 12/06/2022 Medical Rec #:  [id number]          Height:       75.0 in Accession #:    [id number]         Weight:       249.3 lb Date of Birth:  December 10, 1945          BSA:          2.410 m Patient Age:    77 years           BP:           128/70 mmHg Patient Gender: M                  HR:           91 bpm. Exam Location:  Inpatient Procedure: 2D Echo Indications:    elevated troponin  History:        Patient has prior history of Echocardiogram examinations, most                 recent 01/29/2019. Chronic kidney disease, Arrythmias:WPW; Risk                 Factors:Diabetes, Hypertension and Dyslipidemia.  Sonographer:    Delcie Roch RDCS Referring Phys: 60 DAWOOD Teena Irani  Sonographer Comments: Image acquisition challenging due to uncooperative patient. IMPRESSIONS  1. Left ventricular ejection fraction, by estimation, is 55 to 60%. The left ventricle has normal function. The left ventricle has no regional wall motion abnormalities. There is mild left ventricular hypertrophy. Left ventricular diastolic parameters are indeterminate.  2. Right ventricular systolic function is normal. The right ventricular size is mildly enlarged. There is moderately elevated pulmonary artery systolic pressure.  3. Left atrial size was mildly dilated.  4. The mitral valve is normal in structure. Trivial mitral valve regurgitation. No evidence of mitral stenosis.  5. The tricuspid valve is abnormal.  6. The aortic valve is tricuspid. Aortic valve regurgitation  is not visualized. No aortic stenosis is present.  7. The inferior vena cava is dilated in size with <50% respiratory variability, suggesting right atrial pressure of 15 mmHg. FINDINGS  Left Ventricle: Left ventricular ejection fraction, by estimation, is 55 to 60%. The left ventricle has normal  function. The left ventricle has no regional wall motion abnormalities. The left ventricular internal cavity size was normal in size. There is  mild left ventricular hypertrophy. Left ventricular diastolic parameters are indeterminate. Right Ventricle: The right ventricular size is mildly enlarged. Right vetricular wall thickness was not well visualized. Right ventricular systolic function is normal. There is moderately elevated pulmonary artery systolic pressure. The tricuspid regurgitant velocity is 3.04 m/s, and with an assumed right atrial pressure of 15 mmHg, the estimated right ventricular systolic pressure is 52.0 mmHg. Left Atrium: Left atrial size was mildly dilated. Right Atrium: Right atrial size was normal in size. Pericardium: There is no evidence of pericardial effusion. Mitral Valve: The mitral valve is normal in structure. Trivial mitral valve regurgitation. No evidence of mitral valve stenosis. Tricuspid Valve: The tricuspid valve is abnormal. Tricuspid valve regurgitation is mild . No evidence of tricuspid stenosis. Aortic Valve: The aortic valve is tricuspid. Aortic valve regurgitation is not visualized. No aortic stenosis is present. Aortic valve mean gradient measures 4.9 mmHg. Aortic valve peak gradient measures 11.1 mmHg. Aortic valve area, by VTI measures 2.00  cm. Pulmonic Valve: The pulmonic valve was not well visualized. Pulmonic valve regurgitation is trivial. No evidence of pulmonic stenosis. Aorta: The aortic root and ascending aorta are structurally normal, with no evidence of dilitation. Venous: The inferior vena cava is dilated in size with less than 50% respiratory variability, suggesting  right atrial pressure of 15 mmHg. IAS/Shunts: No atrial level shunt detected by color flow Doppler.  LEFT VENTRICLE PLAX 2D LVIDd:         5.10 cm      Diastology LVIDs:         3.40 cm      LV e' medial:    5.98 cm/s LV PW:         1.20 cm      LV E/e' medial:  13.4 LV IVS:        1.10 cm      LV e' lateral:   8.38 cm/s LVOT diam:     1.90 cm      LV E/e' lateral: 9.5 LV SV:         59 LV SV Index:   24 LVOT Area:     2.84 cm  LV Volumes (MOD) LV vol d, MOD A2C: 104.0 ml LV vol d, MOD A4C: 103.0 ml LV vol s, MOD A2C: 53.4 ml LV vol s, MOD A4C: 52.2 ml LV SV MOD A2C:     50.6 ml LV SV MOD A4C:     103.0 ml LV SV MOD BP:      51.1 ml RIGHT VENTRICLE             IVC RV Basal diam:  3.40 cm     IVC diam: 2.80 cm RV S prime:     14.70 cm/s TAPSE (M-mode): 1.8 cm LEFT ATRIUM           Index        RIGHT ATRIUM           Index LA diam:      3.50 cm 1.45 cm/m   RA Area:     20.80 cm LA Vol (A2C): 67.4 ml 27.97 ml/m  RA Volume:   65.20 ml  27.05 ml/m LA Vol (A4C): 95.6 ml 39.67 ml/m  AORTIC VALVE AV Area (Vmax):    1.84 cm AV Area (Vmean):   2.08 cm AV Area (VTI):  2.00 cm AV Vmax:           166.83 cm/s AV Vmean:          99.839 cm/s AV VTI:            0.295 m AV Peak Grad:      11.1 mmHg AV Mean Grad:      4.9 mmHg LVOT Vmax:         108.00 cm/s LVOT Vmean:        73.300 cm/s LVOT VTI:          0.208 m LVOT/AV VTI ratio: 0.70  AORTA Ao Root diam: 3.40 cm Ao Asc diam:  3.40 cm MITRAL VALVE               TRICUSPID VALVE MV Area (PHT): 4.80 cm    TR Peak grad:   37.0 mmHg MV Decel Time: 158 msec    TR Vmax:        304.00 cm/s MV E velocity: 79.90 cm/s MV A velocity: 81.10 cm/s  SHUNTS MV E/A ratio:  0.99        Systemic VTI:  0.21 m                            Systemic Diam: 1.90 cm Dina Rich MD Electronically signed by Dina Rich MD Signature Date/Time: 12/06/2022/3:27:17 PM    Final    DG CHEST PORT 1 VIEW  Result Date: 12/06/2022 CLINICAL DATA:  Dyspnea, cough EXAM: PORTABLE CHEST 1 VIEW  COMPARISON:  12/04/2022 FINDINGS: Lung volumes are small. Developing right basilar opacity may represent increasing atelectasis or developing focal pulmonary infiltrate. No pneumothorax or pleural effusion. Cardiac size within normal limits. Pulmonary vascular crowding at the hila without overt pulmonary edema. No acute bone abnormality. IMPRESSION: 1. Pulmonary hypoinflation. 2. Developing right basilar opacity, atelectasis versus developing focal pulmonary infiltrate. Electronically Signed   By: Helyn Numbers M.D.   On: 12/06/2022 03:17   DG Abd 1 View  Result Date: 12/06/2022 CLINICAL DATA:  Cough, abdominal pain EXAM: ABDOMEN - 1 VIEW COMPARISON:  05/30/2022 FINDINGS: Right flank is excluded from view. Visualized abdominal gas pattern is nonobstructive. No gross free intraperitoneal gas. Underpenetration precludes evaluation of the visceral shadows. Osseous structures are age-appropriate. IMPRESSION: 1. Nonobstructive abdominal gas pattern. Electronically Signed   By: Helyn Numbers M.D.   On: 12/06/2022 03:15   IR Perc Cholecystostomy  Result Date: 12/05/2022 INDICATION: 77 year old male with acute cholecystitis EXAM: IMAGE GUIDED PERCUTANEOUS CHOLECYSTOSTOMY DISCONTINUATION OF PERCUTANEOUS CHOLECYSTOSTOMY MEDICATIONS: None ANESTHESIA/SEDATION: Moderate (conscious) sedation was not employed during this procedure. A total of Versed 1.0 mg and Fentanyl 0 mcg was administered intravenously. Moderate Sedation Time: 0 minutes. The patient's level of consciousness and vital signs were monitored continuously by radiology nursing throughout the procedure under my direct supervision. FLUOROSCOPY TIME:  None COMPLICATIONS: None PROCEDURE: Informed written consent was obtained from the patient after a thorough discussion of the procedural risks, benefits and alternatives. All questions were addressed. Maximal Sterile Barrier Technique was utilized including caps, mask, sterile gowns, sterile gloves, sterile  drape, hand hygiene and skin antiseptic. A timeout was performed prior to the initiation of the procedure. The patient was laid flat on the table under the image intensifier. Before prep and drape could occur, the patient began complaining of crossed a phobia and difficulty breathing. 1 mg of anxiolytic was administered, which did not help him. The patient sat up and I had  a long conversation with the patient about the necessity of the procedure. He refused to continue, essentially withdrawing his consent and we were unable to proceed. He would not continue with the procedure. We had no choice but to discontinue. IMPRESSION: Discontinued attempt at percutaneous cholecystostomy as above Electronically Signed   By: Gilmer Mor D.O.   On: 12/05/2022 17:16   NM Hepatobiliary Liver Func  Result Date: 12/05/2022 CLINICAL DATA:  Abdominal pain.  Cholelithiasis. EXAM: NUCLEAR MEDICINE HEPATOBILIARY IMAGING TECHNIQUE: Sequential images of the abdomen were obtained out to 60 minutes following intravenous administration of radiopharmaceutical. RADIOPHARMACEUTICALS:  5.0 mCi Tc-53m  Choletec IV COMPARISON:  CT 12/04/2022 FINDINGS: Patient image for 1 hour. Counts are read bili clear from the blood pool in thickened of the liver. Counts are evident in the small bowel by 20 minutes. The gallbladder fails to fill at 60 minutes. Patient was agitated and could not proceed further with imaging. IMPRESSION: Non filling of the gallbladder at 60 minutes could indicate cystic duct obstruction. Patient could not tolerate further imaging. Equivocal exam Electronically Signed   By: Genevive Bi M.D.   On: 12/05/2022 14:07   DG CHEST PORT 1 VIEW  Result Date: 12/04/2022 CLINICAL DATA:  Shortness of breath.  Sepsis EXAM: PORTABLE CHEST 1 VIEW COMPARISON:  X-ray 12/03/2022 and older FINDINGS: Underinflation. Small effusions. Adjacent mild opacity is stable. No pneumothorax. Stable enlarged cardiopericardial silhouette. No edema.  Overlapping cardiac leads. Degenerative changes. IMPRESSION: No significant interval change when adjusting for technique. Electronically Signed   By: Karen Kays M.D.   On: 12/04/2022 14:02   US Abdomen Limited RUQ (LIVER/GB)  Result Date: 12/04/2022 CLINICAL DATA:  Cholecystitis EXAM: ULTRASOUND ABDOMEN LIMITED RIGHT UPPER QUADRANT COMPARISON:  Ultrasound 05/30/2022 and CT scan 12/03/2022. FINDINGS: Gallbladder: Distended gallbladder. Slight wall thickening but the patient has a known chronic liver disease. Previous areas of stone is not well appreciated today. This could be technical. Common bile duct: Diameter: 3 mm Liver: Heterogeneous liver consistent with known chronic liver disease. There is a anechoic rounded structure with through transmission in the liver measuring 6.5 cm consistent with a cyst. Portal vein is patent on color Doppler imaging with normal direction of blood flow towards the liver. Other: None. IMPRESSION: Nodular echogenic liver consistent with known chronic liver disease. Hepatic cysts. Please correlate with the prior CT scan. Distended gallbladder with wall thickening but nonspecific in the presence of chronic liver disease. There is some sludge. The stone seen previously are not as well seen on this ultrasound but are likely present based on prior imaging. If there is further concern of acute cholecystitis a HIDA scan could be considered as clinically appropriate for further delineation. Electronically Signed   By: Karen Kays M.D.   On: 12/04/2022 14:01   CT ABDOMEN PELVIS W CONTRAST  Result Date: 12/03/2022 CLINICAL DATA:  Abdomen pain EXAM: CT ABDOMEN AND PELVIS WITH CONTRAST TECHNIQUE: Multidetector CT imaging of the abdomen and pelvis was performed using the standard protocol following bolus administration of intravenous contrast. RADIATION DOSE REDUCTION: This exam was performed according to the departmental dose-optimization program which includes automated exposure  control, adjustment of the mA and/or kV according to patient size and/or use of iterative reconstruction technique. CONTRAST:  75mL OMNIPAQUE IOHEXOL 350 MG/ML SOLN COMPARISON:  CT 05/30/2022 FINDINGS: Lower chest: Lung bases demonstrate small right-sided pleural effusion. Small right cardio phrenic lymph nodes without significant change. Hepatobiliary: Cirrhotic morphology of the liver. 6.1 cm right hepatic cyst for which no  specific imaging follow-up is recommended. Mildly distended gallbladder with multiple stones. Possible gallbladder wall thickening. Trace pericholecystic fluid but there is perihepatic ascites Pancreas: Unremarkable. No pancreatic ductal dilatation or surrounding inflammatory changes. Spleen: Enlarged at 16 cm. Adrenals/Urinary Tract: Adrenal glands are normal. Kidneys show no hydronephrosis. The bladder is unremarkable. No excretion of contrast on delayed views of the kidneys. Stomach/Bowel: Stomach nonenlarged. No dilated small bowel. Diverticular disease of the colon. Question mild wall thickening and inflammation at the hepatic flexure on coronal series 6, image 123 through 151. Negative appendix. Vascular/Lymphatic: Moderate aortic atherosclerosis. No aneurysm. No suspicious lymph nodes. Reproductive: Multiple prostate seeds. Other: Negative for pelvic effusion or free air. Small volume right upper quadrant ascites Musculoskeletal: No acute or suspicious osseous abnormality. Multilevel degenerative changes IMPRESSION: 1. 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 2. Mildly distended gallbladder with multiple stones. Possible gallbladder wall thickening and trace pericholecystic fluid but there is perihepatic ascites. Correlate clinically for acute cholecystitis. Follow-up ultrasound if deemed clinically appropriate. 3. Diverticular disease of the colon. Question mild wall thickening and inflammation at the hepatic flexure of the colon, as may be seen with  mild right-sided diverticulitis. 4. Small right pleural effusion. 5. Aortic atherosclerosis. Aortic Atherosclerosis (ICD10-I70.0). Electronically Signed   By: Jasmine Pang M.D.   On: 12/03/2022 21:15   DG Chest Port 1 View  Result Date: 12/03/2022 CLINICAL DATA:  Shortness of breath and fever EXAM: PORTABLE CHEST 1 VIEW COMPARISON:  01/28/2019 FINDINGS: Low lung volumes. Subsegmental atelectasis at the bases. Stable cardiomediastinal silhouette. No pneumothorax IMPRESSION: Low lung volumes with subsegmental atelectasis at the bases. Electronically Signed   By: Jasmine Pang M.D.   On: 12/03/2022 20:23     TODAY-DAY OF DISCHARGE:  Subjective:   Patrick Kidd today has no headache,no chest abdominal pain,no new weakness tingling or numbness, feels much better wants to go home today.   Objective:   Blood pressure (!) 139/58, pulse 81, temperature 97.9 F (36.6 C), temperature source Oral, resp. rate 16, height 6\' 3"  (1.905 m), weight 104.7 kg, SpO2 100%.  Intake/Output Summary (Last 24 hours) at 12/10/2022 0849 Last data filed at 12/10/2022 0300 Gross per 24 hour  Intake 900 ml  Output 1130 ml  Net -230 ml   Filed Weights   12/09/22 0500 12/09/22 0854 12/10/22 0500  Weight: 110.8 kg 104.3 kg 104.7 kg    Exam: Awake Alert, Oriented *3, No new F.N deficits, Normal affect [place].AT,PERRAL Supple Neck,No JVD, No cervical lymphadenopathy appriciated.  Symmetrical Chest wall movement, Good air movement bilaterally, CTAB RRR,No Gallops,Rubs or new Murmurs, No Parasternal Heave +ve B.Sounds, Abd Soft, Non tender, No organomegaly appriciated, No rebound -guarding or rigidity. No Cyanosis, Clubbing or edema, No new Rash or bruise   PERTINENT RADIOLOGIC STUDIES: IR Perc Cholecystostomy  Result Date: 12/09/2022 INDICATION: 77 year old male presents for percutaneous cholecystostomy EXAM: CHOLECYSTOSTOMY MEDICATIONS: Cefoxitin; The antibiotic was administered within an appropriate time frame  prior to the initiation of the procedure. ANESTHESIA/SEDATION: The anesthesia team was present to provide MAC anesthesia and for patient monitoring during the procedure. Intubation/airway management was performed in interventional radiology. Interventional radiology nursing staff was also present. FLUOROSCOPY TIME:  Fluoroscopy Time:  (4 mGy). COMPLICATIONS: None PROCEDURE: Informed written consent was obtained from the patient and the patient's family after a thorough discussion of the procedural risks, benefits and alternatives. All questions were addressed. Maximal Sterile Barrier Technique was utilized including caps, mask, sterile gowns, sterile gloves, sterile drape, hand hygiene  and skin antiseptic. A timeout was performed prior to the initiation of the procedure. Ultrasound survey of the right upper quadrant was performed for planning purposes. Once the patient is prepped and draped in the usual sterile fashion, the skin and subcutaneous tissues overlying the gallbladder were generously infiltrated 1% lidocaine for local anesthesia. A coaxial needle was advanced under ultrasound guidance through the skin subcutaneous tissues and a small segment of liver into the gallbladder lumen. With removal of the stylet, spontaneous dark bile drainage occurred. Using modified Seldinger technique, a 10 French drain was placed into the gallbladder fossa, with aspiration of the sample for the lab. Contrast injection confirmed position of the tube within the gallbladder lumen. Drainage catheter was attached to gravity drain with a suture retention placed. Patient tolerated the procedure well and remained hemodynamically stable throughout. No complications were encountered and no significant blood loss encountered. IMPRESSION: Status post image guided percutaneous cholecystostomy. Signed, Yvone Neu. Miachel Roux, RPVI [facility] Electronically Signed   By: Gilmer Mor D.O.   On: 12/09/2022 12:11   DG Chest Port 1 View  Result Date: 12/08/2022 CLINICAL DATA:  Dyspnea, fever EXAM: PORTABLE CHEST 1 VIEW COMPARISON:  12/06/2022 chest radiograph. FINDINGS: Low lung volumes. Stable cardiomediastinal silhouette with normal heart size. No pneumothorax. No pulmonary edema. Mild streaky hazy bibasilar lung opacities and slight blunting of the costophrenic angles bilaterally, improved on the right and stable on the left. IMPRESSION: Low lung volumes with mild streaky hazy bibasilar lung opacities and slight blunting of the costophrenic angles bilaterally, improved on the right and stable on the left. Favor atelectasis and possible small bilateral pleural effusions. A component of pneumonia cannot be excluded. Electronically Signed   By: Delbert Phenix M.D.   On: 12/08/2022 16:23     PERTINENT LAB RESULTS: CBC: Recent Labs    12/09/22 0423 12/10/22 0335  WBC 7.0 6.5  HGB 11.8* 11.4*  HCT 33.7* 32.4*  PLT 90* 108*   CMET CMP     Component Value Date/Time   NA 139 12/10/2022 0335   K 3.6 12/10/2022 0335   CL 109 12/10/2022 0335   CO2 22 12/10/2022 0335   GLUCOSE 135 (H) 12/10/2022 0335   BUN 15 12/10/2022 0335   CREATININE 1.01 12/10/2022 0335   CREATININE 0.95 11/19/2022 1052   CREATININE 1.13 06/04/2010 1354   CALCIUM 8.0 (L) 12/10/2022 0335   PROT 4.9 (L) 12/10/2022 0335   ALBUMIN 2.3 (L) 12/10/2022 0335   AST 18 12/10/2022 0335   AST 21 11/19/2022 1052   ALT 20 12/10/2022 0335   ALT 15 11/19/2022 1052   ALKPHOS 71 12/10/2022 0335   BILITOT 1.0 12/10/2022 0335   BILITOT 1.6 (H) 11/19/2022 1052   GFR 70.83 06/10/2022 0959   GFRNONAA >60 12/10/2022 0335   GFRNONAA >60 11/19/2022 1052    GFR Estimated Creatinine Clearance: 78.9 mL/min (by C-G formula based on SCr of 1.01 mg/dL). No results for input(s): "LIPASE", "AMYLASE" in the last 72 hours. No results for input(s): "CKTOTAL", "CKMB", "CKMBINDEX", "TROPONINI" in the last 72  hours. Invalid input(s): "POCBNP" No results for input(s): "DDIMER" in the last 72 hours. No results for input(s): "HGBA1C" in the last 72 hours. No results for input(s): "CHOL", "HDL", "LDLCALC", "TRIG", "CHOLHDL", "LDLDIRECT" in the last 72 hours. No results for input(s): "TSH", "T4TOTAL", "T3FREE", "THYROIDAB" in the last 72 hours.  Invalid input(s): "FREET3" No results for input(s): "VITAMINB12", "FOLATE", "FERRITIN", "TIBC", "IRON", "RETICCTPCT" in  the last 72 hours. Coags: Recent Labs    12/08/22 0834 12/09/22 0423  INR 1.1 1.2   Microbiology: Recent Results (from the past 240 hour(s))  Resp panel by RT-PCR (RSV, Flu A&B, Covid) Anterior Nasal Swab     Status: None   Collection Time: 12/03/22  6:55 PM   Specimen: Anterior Nasal Swab  Result Value Ref Range Status   SARS Coronavirus 2 by RT PCR NEGATIVE NEGATIVE Final   Influenza A by PCR NEGATIVE NEGATIVE Final   Influenza B by PCR NEGATIVE NEGATIVE Final    Comment: (NOTE) The Xpert Xpress SARS-CoV-2/FLU/RSV plus assay is intended as an aid in the diagnosis of influenza from Nasopharyngeal swab specimens and should not be used as a sole basis for treatment. Nasal washings and aspirates are unacceptable for Xpert Xpress SARS-CoV-2/FLU/RSV testing.  Fact Sheet for Patients: BloggerCourse.com  Fact Sheet for Healthcare Providers: SeriousBroker.it  This test is not yet approved or cleared by the [place] FDA and has been authorized for detection and/or diagnosis of SARS-CoV-2 by FDA under an Emergency Use Authorization (EUA). This EUA will remain in effect (meaning this test can be used) for the duration of the COVID-19 declaration under Section 564(b)(1) of the Act, 21 U.S.C. section 360bbb-3(b)(1), unless the authorization is terminated or revoked.     Resp Syncytial Virus by PCR NEGATIVE NEGATIVE Final    Comment: (NOTE) Fact Sheet for  Patients: BloggerCourse.com  Fact Sheet for Healthcare Providers: SeriousBroker.it  This test is not yet approved or cleared by the [place] FDA and has been authorized for detection and/or diagnosis of SARS-CoV-2 by FDA under an Emergency Use Authorization (EUA). This EUA will remain in effect (meaning this test can be used) for the duration of the COVID-19 declaration under Section 564(b)(1) of the Act, 21 U.S.C. section 360bbb-3(b)(1), unless the authorization is terminated or revoked.  Performed at [facility], [address] [place], [place] [address]   Blood Culture (routine x 2)     Status: None   Collection Time: 12/03/22  6:59 PM   Specimen: BLOOD  Result Value Ref Range Status   Specimen Description BLOOD RIGHT ANTECUBITAL  Final   Special Requests   Final    BOTTLES DRAWN AEROBIC AND ANAEROBIC Blood Culture adequate volume   Culture  Setup Time   Final    GRAM NEGATIVE RODS IN BOTH AEROBIC AND ANAEROBIC BOTTLES CRITICAL VALUE NOTED.  VALUE IS CONSISTENT WITH PREVIOUSLY REPORTED AND CALLED VALUE.    Culture   Final    GRAM NEGATIVE RODS SUSCEPTIBILITIES PERFORMED ON PREVIOUS CULTURE WITHIN THE LAST 5 DAYS. Performed at [facility], [address], [place], [place] [address]    Report Status 12/07/2022 FINAL  Final  Blood Culture (routine x 2)     Status: Abnormal   Collection Time: 12/03/22  7:08 PM   Specimen: BLOOD RIGHT FOREARM  Result Value Ref Range Status   Specimen Description BLOOD RIGHT FOREARM  Final   Special Requests   Final    BOTTLES DRAWN AEROBIC AND ANAEROBIC Blood Culture results may not be optimal due to an excessive volume of blood received in culture bottles   Culture  Setup Time   Final    GRAM NEGATIVE RODS IN BOTH AEROBIC AND ANAEROBIC BOTTLES CRITICAL RESULT CALLED TO, READ BACK BY AND VERIFIED WITH: PHARMD J LEDFORD 12/04/22 @ 0714 BY AB Performed at [facility], [address], [place], [place] [address]  Culture KLEBSIELLA OXYTOCA ESCHERICHIA COLI  (A)  Final   Report Status 12/07/2022 FINAL  Final   Organism ID, Bacteria KLEBSIELLA OXYTOCA  Final   Organism ID, Bacteria KLEBSIELLA OXYTOCA  Final   Organism ID, Bacteria ESCHERICHIA COLI  Final   Organism ID, Bacteria ESCHERICHIA COLI  Final      Susceptibility   Escherichia coli - KIRBY BAUER*    CEFAZOLIN INTERMEDIATE Intermediate    Escherichia coli - MIC*    AMPICILLIN 8 SENSITIVE Sensitive     CEFEPIME <=0.12 SENSITIVE Sensitive     CEFTAZIDIME <=1 SENSITIVE Sensitive     CEFTRIAXONE <=0.25 SENSITIVE Sensitive     CIPROFLOXACIN <=0.25 SENSITIVE Sensitive     GENTAMICIN <=1 SENSITIVE Sensitive     IMIPENEM <=0.25 SENSITIVE Sensitive     TRIMETH/SULFA <=20 SENSITIVE Sensitive     AMPICILLIN/SULBACTAM 4 SENSITIVE Sensitive     PIP/TAZO <=4 SENSITIVE Sensitive ug/mL    * ESCHERICHIA COLI    ESCHERICHIA COLI   Klebsiella oxytoca - KIRBY BAUER*    CEFAZOLIN INTERMEDIATE Intermediate    Klebsiella oxytoca - MIC*    AMPICILLIN >=32 RESISTANT Resistant     CEFEPIME <=0.12 SENSITIVE Sensitive     CEFTAZIDIME <=1 SENSITIVE Sensitive     CEFTRIAXONE <=0.25 SENSITIVE Sensitive     CIPROFLOXACIN <=0.25 SENSITIVE Sensitive     GENTAMICIN <=1 SENSITIVE Sensitive     IMIPENEM <=0.25 SENSITIVE Sensitive     TRIMETH/SULFA <=20 SENSITIVE Sensitive     AMPICILLIN/SULBACTAM 8 SENSITIVE Sensitive     PIP/TAZO <=4 SENSITIVE Sensitive ug/mL    * KLEBSIELLA OXYTOCA    KLEBSIELLA OXYTOCA  Blood Culture ID Panel (Reflexed)     Status: Abnormal   Collection Time: 12/03/22  7:08 PM  Result Value Ref Range Status   Enterococcus faecalis NOT DETECTED NOT DETECTED Final   Enterococcus Faecium NOT DETECTED NOT DETECTED Final   Listeria monocytogenes NOT DETECTED NOT DETECTED Final   Staphylococcus species NOT DETECTED NOT DETECTED Final   Staphylococcus aureus (BCID) NOT DETECTED NOT DETECTED  Final   Staphylococcus epidermidis NOT DETECTED NOT DETECTED Final   Staphylococcus lugdunensis NOT DETECTED NOT DETECTED Final   Streptococcus species NOT DETECTED NOT DETECTED Final   Streptococcus agalactiae NOT DETECTED NOT DETECTED Final   Streptococcus pneumoniae NOT DETECTED NOT DETECTED Final   Streptococcus pyogenes NOT DETECTED NOT DETECTED Final   A.calcoaceticus-baumannii NOT DETECTED NOT DETECTED Final   Bacteroides fragilis NOT DETECTED NOT DETECTED Final   Enterobacterales DETECTED (A) NOT DETECTED Final    Comment: CRITICAL RESULT CALLED TO, READ BACK BY AND VERIFIED WITH: PHARMD J LEDFORD 12/04/22 @ 0714 BY AB    Enterobacter cloacae complex NOT DETECTED NOT DETECTED Final   Escherichia coli DETECTED (A) NOT DETECTED Final    Comment: CRITICAL RESULT CALLED TO, READ BACK BY AND VERIFIED WITH: PHARMD J LEDFORD 12/04/22 @ 0714 BY AB    Klebsiella aerogenes NOT DETECTED NOT DETECTED Final   Klebsiella oxytoca DETECTED (A) NOT DETECTED Final    Comment: CRITICAL RESULT CALLED TO, READ BACK BY AND VERIFIED WITH: PHARMD J LEDFORD 12/04/22 @ 0714 BY AB    Klebsiella pneumoniae NOT DETECTED NOT DETECTED Final   Proteus species NOT DETECTED NOT DETECTED Final   Salmonella species NOT DETECTED NOT DETECTED Final   Serratia marcescens NOT DETECTED NOT DETECTED Final   Haemophilus influenzae NOT DETECTED NOT DETECTED Final   Neisseria meningitidis NOT DETECTED NOT DETECTED Final   Pseudomonas aeruginosa NOT DETECTED NOT  DETECTED Final   Stenotrophomonas maltophilia NOT DETECTED NOT DETECTED Final   Candida albicans NOT DETECTED NOT DETECTED Final   Candida auris NOT DETECTED NOT DETECTED Final   Candida glabrata NOT DETECTED NOT DETECTED Final   Candida krusei NOT DETECTED NOT DETECTED Final   Candida parapsilosis NOT DETECTED NOT DETECTED Final   Candida tropicalis NOT DETECTED NOT DETECTED Final   Cryptococcus neoformans/gattii NOT DETECTED NOT DETECTED Final   CTX-M ESBL  NOT DETECTED NOT DETECTED Final   Carbapenem resistance IMP NOT DETECTED NOT DETECTED Final   Carbapenem resistance KPC NOT DETECTED NOT DETECTED Final   Carbapenem resistance NDM NOT DETECTED NOT DETECTED Final   Carbapenem resist OXA 48 LIKE NOT DETECTED NOT DETECTED Final   Carbapenem resistance VIM NOT DETECTED NOT DETECTED Final    Comment: Performed at [facility], [address], [place], [place] [address]  MRSA Next Gen by PCR, Nasal     Status: None   Collection Time: 12/03/22 11:14 PM   Specimen: Nasal Mucosa; Nasal Swab  Result Value Ref Range Status   MRSA by PCR Next Gen NOT DETECTED NOT DETECTED Final    Comment: (NOTE) The GeneXpert MRSA Assay (FDA approved for NASAL specimens only), is one component of a comprehensive MRSA colonization surveillance program. It is not intended to diagnose MRSA infection nor to guide or monitor treatment for MRSA infections. Test performance is not FDA approved in patients less than 87 years old. Performed at [facility], [address], [place], [place] [address]   Aerobic/Anaerobic Culture w Gram Stain (surgical/deep wound)     Status: None (Preliminary result)   Collection Time: 12/09/22 11:01 AM   Specimen: Gallbladder; Bile  Result Value Ref Range Status   Specimen Description GALL BLADDER  Final   Special Requests bile  Final   Gram Stain   Final    ABUNDANT WBC PRESENT, PREDOMINANTLY PMN NO ORGANISMS SEEN    Culture   Final    RARE GRAM NEGATIVE RODS CULTURE REINCUBATED FOR BETTER GROWTH SUSCEPTIBILITIES TO FOLLOW Performed at [facility], [address], [place], [place] [address]    Report Status PENDING  Incomplete    FURTHER DISCHARGE INSTRUCTIONS:  Get Medicines reviewed and adjusted: Please take all your medications with you for your next visit with your Primary MD  Laboratory/radiological data: Please request your Primary MD to go over all hospital tests and procedure/radiological results  at the follow up, please ask your Primary MD to get all Hospital records sent to his/her office.  In some cases, they will be blood work, cultures and biopsy results pending at the time of your discharge. Please request that your primary care M.D. goes through all the records of your hospital data and follows up on these results.  Also Note the following: If you experience worsening of your admission symptoms, develop shortness of breath, life threatening emergency, suicidal or homicidal thoughts you must seek medical attention immediately by calling 911 or calling your MD immediately  if symptoms less severe.  You must read complete instructions/literature along with all the possible adverse reactions/side effects for all the Medicines you take and that have been prescribed to you. Take any new Medicines after you have completely understood and accpet all the possible adverse reactions/side effects.   Do not drive when taking Pain medications or sleeping medications (Benzodaizepines)  Do not take more than prescribed Pain, Sleep and Anxiety Medications. It is not advisable to combine  anxiety,sleep and pain medications without talking with your primary care practitioner  Special Instructions: If you have smoked or chewed Tobacco  in the last 2 yrs please stop smoking, stop any regular Alcohol  and or any Recreational drug use.  Wear Seat belts while driving.  Please note: You were cared for by a hospitalist during your hospital stay. Once you are discharged, your primary care physician will handle any further medical issues. Please note that NO REFILLS for any discharge medications will be authorized once you are discharged, as it is imperative that you return to your primary care physician (or establish a relationship with a primary care physician if you do not have one) for your post hospital discharge needs so that they can reassess your need for medications and monitor your lab values.  Total  Time spent coordinating discharge including counseling, education and face to face time equals greater than 30 minutes.  SignedJeoffrey Massed 12/10/2022 8:49 AM

## 2022-12-10 NOTE — Progress Notes (Signed)
Mobility Specialist Progress Note;   12/10/22 0915  Mobility  Activity Ambulated with assistance in hallway  Level of Assistance Minimal assist, patient does 75% or more  Assistive Device Front wheel walker  Distance Ambulated (ft) 50 ft  Activity Response Tolerated well  Mobility Referral Yes  $Mobility charge 1 Mobility  Mobility Specialist Start Time (ACUTE ONLY) 0915  Mobility Specialist Stop Time (ACUTE ONLY) 0925  Mobility Specialist Time Calculation (min) (ACUTE ONLY) 10 min   Pt agreeable to mobility. Required minA+2 for STS and minG during ambulation. C/o feeling fatigued easily during ambulation which resulted in short distance. No c/o pain when asked today. Pt back in chair with all needs met. Wife in room.   Caesar Bookman Mobility Specialist Please contact via SecureChat or Rehab Office [phone]

## 2022-12-10 NOTE — Progress Notes (Signed)
  Inpatient Rehabilitation Admissions Coordinator   Noted plans for discharge home today. We will sign off.  Ottie Glazier, RN, MSN Rehab Admissions Coordinator [phone] 12/10/2022 9:42 AM

## 2022-12-10 NOTE — Progress Notes (Signed)
Interventional Radiology Brief Note:  Patient will discharge home with drain in place.  Flush drain daily with 5-10 mL sterile saline. Dressing changes Q3days or PRN if soiled/wet and record output once daily. Appreciate RN providing education on flushing/drain care. He will see [place] in clinic in approx 6 weeks. I have placed drain care instructions and follow up contact information in the AVS.  Loyce Dys, MS RD PA-C

## 2022-12-10 NOTE — TOC Transition Note (Signed)
Transition of Care [facility]) - CM/SW Discharge Note   Patient Details  Name: Patrick Kidd MRN: [id number] Date of Birth: [birth year]  Transition of Care [facility]) CM/SW Contact:  Mearl Latin, LCSW Phone Number: 12/10/2022, 9:45 AM   Clinical Narrative:    CSW requested patient's spouse step out of room so CSW could speak with patient regarding ETOH use. Patient pleasant and stated he does not feel he has any issues with alcohol use. He denied need for resources. 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 RNCM has arranged home health therapies. No other needs identified at this time.    Final next level of care: Home w Home Health Services Barriers to Discharge: Barriers Resolved   Patient Goals and CMS Choice      Discharge Placement                    Name of family member notified: spouse Patient and family notified of of transfer: 12/10/22  Discharge Plan and Services Additional resources added to the After Visit Summary for   In-house Referral: Clinical Social Work                                   Social Determinants of Health (SDOH) Interventions SDOH Screenings   Food Insecurity: No Food Insecurity (12/06/2022)  Housing: Low Risk  (12/06/2022)  Transportation Needs: No Transportation Needs (12/06/2022)  Utilities: Not At Risk (12/06/2022)  Alcohol Screen: Low Risk  (12/02/2021)  Depression (PHQ2-9): Low Risk  (12/02/2021)  Financial Resource Strain: Low Risk  (12/02/2021)  Physical Activity: Inactive (12/02/2021)  Social Connections: Moderately Integrated (12/02/2021)  Stress: No Stress Concern Present (12/02/2021)  Tobacco Use: Medium Risk (12/09/2022)     Readmission Risk Interventions     No data to display

## 2022-12-11 ENCOUNTER — Other Ambulatory Visit: Payer: Self-pay | Admitting: General Surgery

## 2022-12-11 ENCOUNTER — Telehealth: Payer: Self-pay

## 2022-12-11 DIAGNOSIS — K828 Other specified diseases of gallbladder: Secondary | ICD-10-CM

## 2022-12-11 NOTE — Transitions of Care (Post Inpatient/ED Visit) (Addendum)
   12/11/2022  Name: Patrick Kidd MRN: [id number] DOB: Nov 18, 1945  Today's TOC FU Call Status: Unsuccessful Attempt #1.     Attempted to reach the patient regarding the most recent Inpatient/ED visit. No answer but was able to leave a voice mail.   Follow Up Plan: Additional outreach attempts will be made to reach the patient to complete the Transitions of Care (Post Inpatient/ED visit) call.    Gabriel Cirri MSN, RN [place]  [facility] Management Coordinator [email or web address] Direct Dial: [phone] Fax: [phone]

## 2022-12-12 ENCOUNTER — Telehealth: Payer: Self-pay

## 2022-12-12 NOTE — Telephone Encounter (Signed)
Current BP 160/70, CBG 121  Lonia Chimera with TOC has called and stated the pt has swelling in both ankles and was recently was in the hospital for sepsis with a bad gallbladder.  Marchelle Folks stated she reached out to the surgeons but they stated for her to call the pts PCP office. Pt does not have SOB or chest pains.  Marchelle Folks [phone]

## 2022-12-12 NOTE — Telephone Encounter (Signed)
Spoke with the pt and was able to inform him of Dr. Lawerance Bach advice "elevate feet when sitting, wear compression stocking to help with the swelling. If pt does notice redness, skin is warm to the touch, SOB and/or chest pains to go to the ER ASAP"  Pt has stated understanding of the above instructions and has denied SOB, chest pains, redness and skin being warm to the touch. PT stated understanding if any of those sxs develop to go go to the ER.

## 2022-12-12 NOTE — Transitions of Care (Post Inpatient/ED Visit) (Addendum)
12/12/2022  Name: Patrick Kidd MRN: [id number] DOB: 03/15/1945  Today's TOC FU Call Status: Today's TOC FU Call Status:: Successful TOC FU Call Completed TOC FU Call Complete Date: 12/12/22 Patient's Name and Date of Birth confirmed.  Transition Care Management Follow-up Telephone Call Date of Discharge: 12/10/22 Discharge Facility: [facility] Type of Discharge: Inpatient Admission Primary Inpatient Discharge Diagnosis:: Septic shock How have you been since you were released from the hospital?: Better (C/o swelling in ankles and wrist.  BP 160/70) Any questions or concerns?: Yes Patient Questions/Concerns:: swelling Patient Questions/Concerns Addressed: Notified Provider of Patient Questions/Concerns  Items Reviewed: Did you receive and understand the discharge instructions provided?: Yes Medications obtained,verified, and reconciled?: Yes (Medications Reviewed) Any new allergies since your discharge?: No Dietary orders reviewed?: Yes Type of Diet Ordered:: low salt heart healthy diet Do you have support at home?: Yes People in Home: spouse Name of Support/Comfort Primary Source: wife  Medications Reviewed Today: Medications Reviewed Today     Reviewed by Earlie Server, RN (Registered Nurse) on 12/12/22 at 1614  Med List Status: <None>   Medication Order Taking? Sig Documenting Provider Last Dose Status Informant  acetaminophen (TYLENOL) 500 MG tablet [id number] Yes Take 1 tablet (500 mg total) by mouth every 6 (six) hours as needed.  Patient taking differently: Take 1,000 mg by mouth every 6 (six) hours as needed for moderate pain (pain score 4-6) or mild pain (pain score 1-3).   Derwood Kaplan, MD Taking Active Self, Spouse/Significant Other, Pharmacy Records  amoxicillin-clavulanate (AUGMENTIN) 875-125 MG tablet [id number] Yes Take 1 tablet by mouth every 12 (twelve) hours for 5 days. Maretta Bees, MD Taking Active   Continuous Blood Gluc Receiver  (FREESTYLE LIBRE 3 READER) DEVI [id number] Yes 1 Act by Does not apply route daily. Etta Grandchild, MD Taking Active Self, Spouse/Significant Other, Pharmacy Records  Continuous Blood Gluc Sensor (FREESTYLE LIBRE 3 SENSOR) [place] [id number] Yes 1 Act by Does not apply route daily. Place 1 sensor on the skin every 14 days. Use to check glucose continuously Etta Grandchild, MD Taking Active Self, Spouse/Significant Other, Pharmacy Records  gabapentin (NEURONTIN) 300 MG capsule [id number] Yes Take 1 capsule (300 mg total) by mouth 3 (three) times daily. Drema Dallas, DO Taking Active Self, Spouse/Significant Other, Pharmacy Records  HYDROcodone-acetaminophen (NORCO/VICODIN) 5-325 MG tablet [id number] Yes Take 1 tablet by mouth every 6 (six) hours as needed for severe pain (pain score 7-10). Iva Boop, MD Taking Active Self, Spouse/Significant Other, Pharmacy Records  insulin glargine, 2 Unit Dial, (TOUJEO MAX SOLOSTAR) 300 UNIT/ML Solostar Pen [id number] Yes Inject 12 Units into the skin daily. Via SANOFI pt assistance Ghimire, Werner Lean, MD Taking Active   Insulin Pen Needle (PEN NEEDLES) 32G X 5 MM MISC [id number] Yes Inject 1 Act into the skin daily. Use to administer insulin. Dx E11.9 Dispense based on insurance preference. Etta Grandchild, MD Taking Active Self, Spouse/Significant Other, Pharmacy Records  Lancets [facility] MULTICLIX) lancets [id number] Yes Use to check sugar daily. DX: E11.8 Etta Grandchild, MD Taking Active Self, Spouse/Significant Other, Pharmacy Records  metFORMIN (GLUCOPHAGE) 500 MG tablet [id number] Yes TAKE 1 TABLET BY MOUTH 2 TIMES DAILY WITH A MEAL. Etta Grandchild, MD Taking Active Self, Spouse/Significant Other, Pharmacy Records  Multiple Vitamins-Minerals (CENTRUM SILVER 50+MEN) TABS [id number] Yes Take 1 tablet by mouth daily. [provider] Taking Active Self, Spouse/Significant Other, Pharmacy Records  pantoprazole (PROTONIX) 40 MG tablet [id number] Yes Take 1 tablet  (  40 mg total) by mouth 2 (two) times daily. TAKE 1 TABLET 30 MINUTES BEFORE MEALS (BREAKFAST AND SUPPER) TWICE A DAY Etta Grandchild, MD Taking Active Self, Spouse/Significant Other, Pharmacy Records            Home Care and Equipment/Supplies: Were Home Health Services Ordered?: Yes Name of Home Health Agency:: [facility] Any new equipment or medical supplies ordered?: No  Functional Questionnaire: Do you need assistance with bathing/showering or dressing?: No Do you need assistance with meal preparation?: No Do you need assistance with eating?: No Do you have difficulty maintaining continence: No Do you need assistance with getting out of bed/getting out of a chair/moving?: No Do you have difficulty managing or taking your medications?: No  Follow up appointments reviewed: PCP Follow-up appointment confirmed?: No (Reviewed importance with patient.) MD Provider Line Number:[phone] Given: No Specialist Hospital Follow-up appointment confirmed?: Yes Follow-Up Specialty Provider:: [facility] Do you need transportation to your follow-up appointment?: No Do you understand care options if your condition(s) worsen?: Yes-patient verbalized understanding  SDOH Interventions Today    Flowsheet Row Most Recent Value  SDOH Interventions   Food Insecurity Interventions Intervention Not Indicated  Housing Interventions Intervention Not Indicated  Transportation Interventions Intervention Not Indicated     Placed call to [facility] and spoke with Haydelyn.  Reviewed concern for edema.  Haydelyn sent message to Dr. Janee Morn who deferred to Dr. Audrie Lia.  Message was sent back to have patient call PCP.  Placed call to PCP and spoke with Hilda Lias.  Reviewed with nurse BP of 160/70,  CBG 121 and patient reports of swelling.  On Call MD is being paged. Awaiting a call back.  Patient updated.   Reviewed all discharge instructions and follow up plans.  Reviewed with  patient that he knows how to manage his drain.  Reports his plan to change dressing this evening.    1710:  Call back from MD office and reports that provider and nurse spoke with patient and patient was directed to elevate, compression hose, ambulate often.  Patient was instructed to go to the ED for chest pain, shortness of breath or warmth to ankles.  I inquired about follow up as patient needs a follow up appointment and MD office will call patient at the beginning of the week to fit patient into PCP schedule.   Called patient back. No answer. Left a message. Late entry:  Patient returned call and was provided above information.  I have placed patient back on my schedule to call in 1 week.   Lonia Chimera, RN, BSN, CEN [employer]- Transition of Care Team.  [facility] [phone]

## 2022-12-14 LAB — AEROBIC/ANAEROBIC CULTURE W GRAM STAIN (SURGICAL/DEEP WOUND)

## 2022-12-15 ENCOUNTER — Other Ambulatory Visit (HOSPITAL_COMMUNITY): Payer: Self-pay

## 2022-12-15 MED ORDER — NORMAL SALINE FLUSH 0.9 % IV SOLN
INTRAVENOUS | 0 refills | Status: DC
  Filled 2022-12-15: qty 450, 45d supply, fill #0

## 2022-12-16 ENCOUNTER — Telehealth: Payer: Self-pay | Admitting: Internal Medicine

## 2022-12-16 NOTE — Telephone Encounter (Signed)
Please advise 

## 2022-12-16 NOTE — Telephone Encounter (Signed)
Pt wife is calling for her husband and was wondering if her husband get referred to occ and phys therapy with; [facility] [address] [place] [address] [phone]  Wife also mentioned he need this to follow from his 7day stay at the hospital. I tried setting him an appointment with you for a hospital follow up and she was pushing the referral instead of waiting to be seen.  PLEASE ADVISE Thanks

## 2022-12-19 ENCOUNTER — Telehealth: Payer: Self-pay | Admitting: Student

## 2022-12-19 ENCOUNTER — Other Ambulatory Visit: Payer: Self-pay | Admitting: Internal Medicine

## 2022-12-19 ENCOUNTER — Telehealth: Payer: Self-pay

## 2022-12-19 ENCOUNTER — Other Ambulatory Visit: Payer: Self-pay

## 2022-12-19 DIAGNOSIS — E118 Type 2 diabetes mellitus with unspecified complications: Secondary | ICD-10-CM

## 2022-12-19 NOTE — Patient Outreach (Signed)
  Care Management  Transitions of Care Program Transitions of Care Post-discharge week 2   error

## 2022-12-19 NOTE — Telephone Encounter (Signed)
Interventional Radiology Brief Note:  PA requested to call patient for drain care questions.  All questions answered.  Follow-up appointment information provided.  Patient verbalizes understanding.   Loyce Dys, MS RD PA-C 12:15 PM

## 2022-12-19 NOTE — Patient Outreach (Signed)
Care Management  Transitions of Care Program Transitions of Care Post-discharge week 2   12/19/2022 Patrick: Patrick Kidd MRN: [id number] DOB: [birth year]  Subjective: Patrick Kidd is a 77 y.o. year old male who is a primary care patient of Etta Grandchild, MD. The Care Management team Engaged with patient Engaged with patient by telephone to assess and address transitions of care needs.   Consent to Services:  Patient was given information about care management services, agreed to services, and gave verbal consent to participate.   Assessment: Patient reports no pain. Continues to have questions about drain care. No hospital follow up as of yet.  No longer needing to use a cane or walker.  Reports taking all medications as prescribed.           SDOH Interventions    Flowsheet Row Telephone from 12/12/2022 in [place] POPULATION HE[facility] DEPARTMENT Telephone from 06/05/2022 in [facility] Community Care Coordination Clinical Support from 12/02/2021 in [facility] Chronic Care Management from 04/19/2020 in [facility] [place]  SDOH Interventions      Food Insecurity Interventions Intervention Not Indicated Intervention Not Indicated Intervention Not Indicated --  Housing Interventions Intervention Not Indicated -- Intervention Not Indicated --  Transportation Interventions Intervention Not Indicated Intervention Not Indicated  [normally drives self,  wife assists as needed after recent hospitalization] Intervention Not Indicated --  Utilities Interventions -- -- Intervention Not Indicated --  Alcohol Usage Interventions -- -- Intervention Not Indicated (Score <7) --  Financial Strain Interventions -- -- Intervention Not Indicated Other (Comment)  [Farxiga PAP approved. Lovenia Kim PAP]  Physical Activity Interventions -- -- Intervention Not Indicated --  Social Connections Interventions -- -- Intervention  Not Indicated --       Goals Addressed               This Visit's Progress     Patietn will report no readmissions to the hospital in the next 30 days. (pt-stated)        Current Barriers:  Provider appointments no PCP follow up. Patient had questions initially about swelling in legs.  Equipment/DME Patient reports that he does not know how to manage his drain  RNCM Clinical Goal(s):  Patient will work with the Care Management team over the next 30 days to address Transition of Care Barriers: Provider appointments Equipment/DME demonstrate understanding of rationale for each prescribed medication as evidenced by patient report attend all scheduled medical appointments: PCP and specialist as evidenced by review of EMR and patient report  through collaboration with RN Care manager, provider, and care team.   Interventions: Evaluation of current treatment plan related to  self management and patient's adherence to plan as established by provider Surgery (Percutaneous Cholecystostomy drain/sepsis):  (Status: New goal.) Short Term Goal Evaluation of current treatment plan related to cholecystomy  surgery assessed patient/caregiver understanding of surgical procedure   reviewed post-operative instructions with patient/caregiver addressed questions about post - surgical incision care  reviewed medications with patient and addressed questions reviewed scheduled provider appointments with patient:  encoruraged follow up as recommended. confirmed availability of transportation to all appointments patient  contacted provider to discuss 12/12/2022 Fluid overload Placed call to Interventional radiology and spoke with Baird Lyons who will call the patient. Able to get a hospital follow up appointment scheduled for 12/22/2022. Reviewed with patient time of appointment.  Encouraged patient to discuss OT and PT at office visit on 12/22/2022 Reviewed  with patient that he has no pain and has completed  his antibiotic course.   Patient Goals/Self-Care Activities: Participate in Transition of Care Program/Attend [facility] scheduled calls Notify RN Care Manager of TOC call rescheduling needs Take all medications as prescribed Attend all scheduled provider appointments  Follow Up Plan:  Telephone follow up appointment with care management team member scheduled for:  12/26/2022           Plan: Telephone follow up appointment with care management team member scheduled for:  12/26/2022   Lonia Chimera, RN, BSN, CEN Population Health- Transition of Care Team.  [facility] [phone]

## 2022-12-19 NOTE — Telephone Encounter (Signed)
No they didn't go with [facility]

## 2022-12-22 ENCOUNTER — Encounter: Payer: Self-pay | Admitting: Family Medicine

## 2022-12-22 ENCOUNTER — Ambulatory Visit: Payer: Medicare Other | Admitting: Family Medicine

## 2022-12-22 VITALS — BP 124/70 | HR 78 | Temp 97.6°F | Ht 75.0 in | Wt 228.0 lb

## 2022-12-22 DIAGNOSIS — B9620 Unspecified Escherichia coli [E. coli] as the cause of diseases classified elsewhere: Secondary | ICD-10-CM | POA: Diagnosis not present

## 2022-12-22 DIAGNOSIS — Z794 Long term (current) use of insulin: Secondary | ICD-10-CM | POA: Diagnosis not present

## 2022-12-22 DIAGNOSIS — R7881 Bacteremia: Secondary | ICD-10-CM | POA: Diagnosis not present

## 2022-12-22 DIAGNOSIS — E118 Type 2 diabetes mellitus with unspecified complications: Secondary | ICD-10-CM

## 2022-12-22 DIAGNOSIS — R5381 Other malaise: Secondary | ICD-10-CM

## 2022-12-22 DIAGNOSIS — I10 Essential (primary) hypertension: Secondary | ICD-10-CM

## 2022-12-22 DIAGNOSIS — I480 Paroxysmal atrial fibrillation: Secondary | ICD-10-CM

## 2022-12-22 DIAGNOSIS — Z51A Encounter for sepsis aftercare: Secondary | ICD-10-CM | POA: Diagnosis not present

## 2022-12-22 DIAGNOSIS — A419 Sepsis, unspecified organism: Secondary | ICD-10-CM

## 2022-12-22 DIAGNOSIS — N1831 Chronic kidney disease, stage 3a: Secondary | ICD-10-CM | POA: Diagnosis not present

## 2022-12-22 DIAGNOSIS — E1122 Type 2 diabetes mellitus with diabetic chronic kidney disease: Secondary | ICD-10-CM

## 2022-12-22 DIAGNOSIS — Z23 Encounter for immunization: Secondary | ICD-10-CM

## 2022-12-22 DIAGNOSIS — Z09 Encounter for follow-up examination after completed treatment for conditions other than malignant neoplasm: Secondary | ICD-10-CM

## 2022-12-22 DIAGNOSIS — K7030 Alcoholic cirrhosis of liver without ascites: Secondary | ICD-10-CM | POA: Diagnosis not present

## 2022-12-22 LAB — CBC WITH DIFFERENTIAL/PLATELET
Basophils Absolute: 0 10*3/uL (ref 0.0–0.1)
Basophils Relative: 0.4 % (ref 0.0–3.0)
Eosinophils Absolute: 0.1 10*3/uL (ref 0.0–0.7)
Eosinophils Relative: 1.1 % (ref 0.0–5.0)
HCT: 39 % (ref 39.0–52.0)
Hemoglobin: 13.4 g/dL (ref 13.0–17.0)
Lymphocytes Relative: 57.2 % — ABNORMAL HIGH (ref 12.0–46.0)
Lymphs Abs: 4.4 10*3/uL — ABNORMAL HIGH (ref 0.7–4.0)
MCHC: 34.5 g/dL (ref 30.0–36.0)
MCV: 94.9 fL (ref 78.0–100.0)
Monocytes Absolute: 0.4 10*3/uL (ref 0.1–1.0)
Monocytes Relative: 5.7 % (ref 3.0–12.0)
Neutro Abs: 2.7 10*3/uL (ref 1.4–7.7)
Neutrophils Relative %: 35.6 % — ABNORMAL LOW (ref 43.0–77.0)
Platelets: 217 10*3/uL (ref 150.0–400.0)
RBC: 4.11 Mil/uL — ABNORMAL LOW (ref 4.22–5.81)
RDW: 13.9 % (ref 11.5–15.5)
WBC: 7.6 10*3/uL (ref 4.0–10.5)

## 2022-12-22 NOTE — Patient Instructions (Signed)
Monitor your blood sugars closely.   If your fasting (morning) blood sugar is higher than 150, increase your insulin Toujeo, by 2 units.

## 2022-12-22 NOTE — Progress Notes (Signed)
Subjective:     Patient ID: Patrick Kidd, male    DOB: [birth year], 77 y.o.   MRN: [id number]  Chief Complaint  Patient presents with   Hospitalization Follow-up    D/c 11/6, states he is still slowly recovering. Needs bloodwork    HPI  History of Present Illness         He is here for a hospital discharge follow up. His PCP is Dr. Yetta Barre. This is my first time seeing this patient.   He was diagnosed with septic shock due to Recurrent acute calculus cholecystitis with E. coli/Klebsiella bacteremia and hospitalized.   Cirrhosis- thrombocytopenia- mild   Chronic HF  DM- last A1c 6.7% on 11/19/2022. FBS over the past few days have been 120-149  Currently taking 12 units of Toujeo daily.   He has an appointment with general surgery on 01/16/2023   States he declined home PT with [facility] and requests PT with [facility].  Working on improving stamina.   States he left the hospital before actual discharge and did not receive his paperwork.   States he declined home health. He would like to have an order for PT at [facility].   States he is drove himself to this appointment. He is able to walk now but is not steady on his feet.   Admit Date: 12/03/2022 Discharge date: 12/10/2022   Recommendations for Outpatient Follow-up:  Follow up with PCP in 1-2 weeks Please obtain CMP/CBC in one week Please ensure follow up with general surgery, radiology   Admitted From:  Home   Disposition: Home health   Discharge Condition: good     Health Maintenance Due  Topic Date Due   Zoster Vaccines- Shingrix (1 of 2) Never done   OPHTHALMOLOGY EXAM  05/17/2022   Diabetic kidney evaluation - Urine ACR  11/06/2022   Medicare Annual Wellness (AWV)  12/03/2022    Past Medical History:  Diagnosis Date   Acrophobia    Alcoholic cirrhosis (HCC) 10/04/2015   Anemia    Arthritis    foot by big toe   BPH associated with nocturia    Cataract    removed both eyes   Cholecystitis  05/2022   tx Abx   Chronic cough    PMH of   CLL (chronic lymphocytic leukemia) (HCC)    COVID-19 12/2018   Diabetes mellitus without complication (HCC)    Diverticulosis 07/03/2010   Colonoscopy.    Duodenal ulcer 2017   Fallen arches    Bilateral   GERD (gastroesophageal reflux disease)    Gout    Granuloma annulare    Hx of adenomatous colonic polyps multiple   Hydrocele 2011   Large septated right hydrocele   Liver cyst    Liver lesion    Nonspecific elevation of levels of transaminase or lactic acid dehydrogenase (LDH)    Obesity    Peripheral neuropathy    Plantar fasciitis    PMH of   Portal hypertension (HCC) 2017   Prostate cancer (HCC)    Right shoulder pain 11/2017   Sleep apnea    no cpap, patient denies   Thiamine deficiency    Wears reading eyeglasses    WPW (Wolff-Parkinson-White syndrome) 02/05/2019    Past Surgical History:  Procedure Laterality Date   BACK SURGERY  04/28/2022   CATARACT EXTRACTION, BILATERAL  12/2011   Dr Nile Riggs   COLONOSCOPY  2017   COLONOSCOPY W/ POLYPECTOMY  07/03/2010   2 adenomas, diverticulosis on right. Dr  Gessner   CYSTOSCOPY N/A 03/15/2018   Procedure: Derinda Late;  Surgeon: Crista Elliot, MD;  Location: [facility];  Service: Urology;  Laterality: N/A;  NO SEEDS FOUND IN BLADDER   ESOPHAGOGASTRODUODENOSCOPY (EGD) WITH PROPOFOL N/A 01/08/2016   Procedure: ESOPHAGOGASTRODUODENOSCOPY (EGD) WITH PROPOFOL;  Surgeon: Meryl Dare, MD;  Location: [facility];  Service: Endoscopy;  Laterality: N/A;   FLEXIBLE SIGMOIDOSCOPY  2000   IR PERC CHOLECYSTOSTOMY  12/05/2022   IR PERC CHOLECYSTOSTOMY  12/09/2022   RADIOACTIVE SEED IMPLANT N/A 03/15/2018   Procedure: RADIOACTIVE SEED IMPLANT/BRACHYTHERAPY IMPLANT;  Surgeon: Crista Elliot, MD;  Location: [facility];  Service: Urology;  Laterality: N/A;   64 SEEDS IMPLANTED   RADIOLOGY WITH ANESTHESIA N/A 12/09/2022   Procedure: IR WITH  ANESTHESIA;  Surgeon: Radiologist, Medication, MD;  Location: [facility];  Service: Radiology;  Laterality: N/A;   SIGMOIDOSCOPY     SPACE OAR INSTILLATION N/A 03/15/2018   Procedure: SPACE OAR INSTILLATION;  Surgeon: Crista Elliot, MD;  Location: [facility];  Service: Urology;  Laterality: N/A;   WISDOM TOOTH EXTRACTION      Family History  Problem Relation Age of Onset   Lung cancer Mother        smoker   Leukemia Father        Acute myelocytic   Diabetes Neg Hx    Stroke Neg Hx    Heart disease Neg Hx    Colon cancer Neg Hx    Colon polyps Neg Hx    Esophageal cancer Neg Hx    Rectal cancer Neg Hx    Stomach cancer Neg Hx     Social History   Socioeconomic History   Marital status: Married    Spouse name: [name]   Number of children: 2   Years of education: Not on file   Highest education level: Some college, no degree  Occupational History   Occupation: Multimedia programmer estate  Tobacco Use   Smoking status: Former    Current packs/day: 0.00    Average packs/day: 2.0 packs/day for 6.0 years (12.0 ttl pk-yrs)    Types: Cigarettes    Start date: 02/03/1965    Quit date: 02/04/1971    Years since quitting: 51.9    Passive exposure: Never   Smokeless tobacco: Never   Tobacco comments:    smoked age [age], up to 2 ppd  Vaping Use   Vaping status: Never Used  Substance and Sexual Activity   Alcohol use: Not Currently    Comment: No alochol since Christmas Day 2020   Drug use: No   Sexual activity: Yes    Partners: Female  Other Topics Concern   Not on file  Social History Narrative   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 He lives with his wife in a one level home. He swims most days.   Social Determinants of Health   Financial Resource Strain: Low Risk  (12/02/2021)   Overall  Financial Resource Strain (CARDIA)    Difficulty of Paying Living Expenses: Not hard at all  Food Insecurity: No Food Insecurity (12/12/2022)   Hunger Vital Sign    Worried About Running Out of Food in the Last Year: Never true    Ran Out of Food in  the Last Year: Never true  Transportation Needs: No Transportation Needs (12/12/2022)   PRAPARE - Administrator, Civil Service (Medical): No    Lack of Transportation (Non-Medical): No  Physical Activity: Inactive (12/02/2021)   Exercise Vital Sign    Days of Exercise per Week: 0 days    Minutes of Exercise per Session: 0 min  Stress: No Stress Concern Present (12/02/2021)   [employer] of Occupational Health - Occupational Stress Questionnaire    Feeling of Stress : Not at all  Social Connections: Moderately Integrated (12/02/2021)   Social Connection and Isolation Panel [NHANES]    Frequency of Communication with Friends and Family: More than three times a week    Frequency of Social Gatherings with Friends and Family: More than three times a week    Attends Religious Services: Never    Database administrator or Organizations: Yes    Attends Engineer, structural: More than 4 times per year    Marital Status: Married  Catering manager Violence: Not At Risk (12/12/2022)   Humiliation, Afraid, Rape, and Kick questionnaire    Fear of Current or Ex-Partner: No    Emotionally Abused: No    Physically Abused: No    Sexually Abused: No    Outpatient Medications Prior to Visit  Medication Sig Dispense Refill   acetaminophen (TYLENOL) 500 MG tablet Take 1 tablet (500 mg total) by mouth every 6 (six) hours as needed. (Patient taking differently: Take 1,000 mg by mouth every 6 (six) hours as needed for moderate pain (pain score 4-6) or mild pain (pain score 1-3).) 20 tablet 0   Continuous Blood Gluc Receiver (FREESTYLE LIBRE 3 READER) DEVI 1 Act by Does not apply route daily. 1 each 3   Continuous Blood Gluc Sensor  (FREESTYLE LIBRE 3 SENSOR) MISC 1 Act by Does not apply route daily. Place 1 sensor on the skin every 14 days. Use to check glucose continuously 2 each 5   gabapentin (NEURONTIN) 300 MG capsule Take 1 capsule (300 mg total) by mouth 3 (three) times daily. 270 capsule 3   insulin glargine, 2 Unit Dial, (TOUJEO MAX SOLOSTAR) 300 UNIT/ML Solostar Pen Inject 12 Units into the skin daily. Via SANOFI pt assistance     Insulin Pen Needle (PEN NEEDLES) 32G X 5 MM MISC Inject 1 Act into the skin daily. Use to administer insulin. Dx E11.9 Dispense based on insurance preference. 100 each 1   Lancets (ACCU-CHEK MULTICLIX) lancets Use to check sugar daily. DX: E11.8 100 each 12   metFORMIN (GLUCOPHAGE) 500 MG tablet TAKE 1 TABLET BY MOUTH 2 TIMES DAILY WITH A MEAL. 180 tablet 0   Multiple Vitamins-Minerals (CENTRUM SILVER 50+MEN) TABS Take 1 tablet by mouth daily.     pantoprazole (PROTONIX) 40 MG tablet Take 1 tablet (40 mg total) by mouth 2 (two) times daily. TAKE 1 TABLET 30 MINUTES BEFORE MEALS (BREAKFAST AND SUPPER) TWICE A DAY 180 tablet 0   Sodium Chloride Flush (NORMAL SALINE FLUSH) 0.9 % SOLN Flush drain with 5ml once daily. 450 mL 0   HYDROcodone-acetaminophen (NORCO/VICODIN) 5-325 MG tablet Take 1 tablet by mouth every 6 (six) hours as needed for severe pain (pain score 7-10). (Patient not taking: Reported on 12/22/2022) 5 tablet 0   No facility-administered medications prior to visit.    Allergies  Allergen Reactions   Rifaximin Nausea And Vomiting    Required EMS visit, although patient did not go to hospital   Beta  Adrenergic Blockers     Likely WPW.  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  Use with caution.    Review of Systems  Constitutional:  Positive for malaise/fatigue. Negative for chills and fever.  Respiratory:  Negative for shortness of breath.   Cardiovascular:  Negative for chest pain, palpitations and leg swelling.  Gastrointestinal:  Negative for  abdominal pain, constipation, diarrhea, nausea and vomiting.  Genitourinary:  Negative for dysuria, frequency and urgency.  Neurological:  Negative for dizziness and focal weakness.       Objective:    Physical Exam Constitutional:      General: He is not in acute distress.    Appearance: He is not ill-appearing.  Eyes:     Extraocular Movements: Extraocular movements intact.     Conjunctiva/sclera: Conjunctivae normal.  Cardiovascular:     Rate and Rhythm: Normal rate and regular rhythm.  Pulmonary:     Effort: Pulmonary effort is normal.     Breath sounds: Normal breath sounds.  Abdominal:     General: There is no distension.     Palpations: Abdomen is soft.       Comments: Drain in place covered with a clean, dry dressing.   Musculoskeletal:     Cervical back: Normal range of motion and neck supple.     Right lower leg: No edema.     Left lower leg: No edema.  Skin:    General: Skin is warm and dry.  Neurological:     General: No focal deficit present.     Mental Status: He is alert and oriented to person, place, and time.     Motor: No weakness.     Coordination: Coordination normal.     Gait: Gait normal.  Psychiatric:        Mood and Affect: Mood normal.        Behavior: Behavior normal.        Thought Content: Thought content normal.      BP 124/70 (BP Location: Left Arm, Patient Position: Sitting, Cuff Size: Large)   Pulse 78   Temp 97.6 F (36.4 C) (Temporal)   Ht 6\' 3"  (1.905 m)   Wt 228 lb (103.4 kg)   SpO2 98%   BMI 28.50 kg/m  Wt Readings from Last 3 Encounters:  12/22/22 228 lb (103.4 kg)  12/10/22 230 lb 13.2 oz (104.7 kg)  12/02/22 232 lb (105.2 kg)       Assessment & Plan:   Problem List Items Addressed This Visit     Alcoholic cirrhosis (HCC)   Relevant Orders   Comprehensive metabolic panel   E coli bacteremia   Essential hypertension   Paroxysmal atrial fibrillation (HCC)   Septic shock (HCC) - Primary   Relevant Orders    CBC with Differential/Platelet   Comprehensive metabolic panel   Stage 3a chronic kidney disease (HCC)   Relevant Orders   Comprehensive metabolic panel   Type 2 diabetes mellitus with complication, with long-term current use of insulin (HCC)   Relevant Orders   CBC with Differential/Platelet   Comprehensive metabolic panel   Other Visit Diagnoses     Hospital discharge follow-up       Relevant Orders   Ambulatory referral to Physical Therapy   Need for influenza vaccination       Relevant Orders   Flu Vaccine Trivalent High Dose (Fluad) (Completed)   Physical deconditioning       Relevant  Orders   Ambulatory referral to Physical Therapy      Here for follow up due to hospitalization for septic shock r/t recurrent acute calculus cholecystitis with E. coli/Klebsiella bacteremia  Reports feeling much better since discharged home. He drove himself here today. No longer needing assistive device for ambulation.  He did not accept home health but now would like to have a referral for PT at [facility]. Order placed.  His wife is doing dressing changes for drain. He has upcoming appointment with general surgery in mid December.  DM is well controlled. Advised to continue current medications including Toujeo 12 units daily unless his FBS increases >150. He will then increase Toujeo by 2 units.  Euvolemic on exam.  CBC, CMP ordered. Will follow up pending labs. He will follow up with Dr. Yetta Barre, PCP, in 3 months for chronic health conditions.    I have discontinued Darcel Bayley B. Kallal "Bryan"'s HYDROcodone-acetaminophen. I am also having him maintain his accu-chek multiclix, FreeStyle Libre 3 Sensor, [employer] 3 Reader, Pen Needles, metFORMIN, gabapentin, pantoprazole, acetaminophen, Centrum Silver 50+Men, Toujeo Max SoloStar, and Normal Saline Flush.  No orders of the defined types were placed in this encounter.

## 2022-12-23 LAB — COMPREHENSIVE METABOLIC PANEL
ALT: 13 U/L (ref 0–53)
AST: 21 U/L (ref 0–37)
Albumin: 4 g/dL (ref 3.5–5.2)
Alkaline Phosphatase: 96 U/L (ref 39–117)
BUN: 10 mg/dL (ref 6–23)
CO2: 29 meq/L (ref 19–32)
Calcium: 9.4 mg/dL (ref 8.4–10.5)
Chloride: 103 meq/L (ref 96–112)
Creatinine, Ser: 1.01 mg/dL (ref 0.40–1.50)
GFR: 71.4 mL/min (ref >60.00)
Glucose, Bld: 112 mg/dL — ABNORMAL HIGH (ref 70–99)
Potassium: 4.4 meq/L (ref 3.5–5.1)
Sodium: 141 meq/L (ref 135–145)
Total Bilirubin: 1.1 mg/dL (ref 0.2–1.2)
Total Protein: 6.4 g/dL (ref 6.0–8.3)

## 2022-12-24 ENCOUNTER — Other Ambulatory Visit: Payer: Self-pay | Admitting: Internal Medicine

## 2022-12-24 ENCOUNTER — Telehealth: Payer: Self-pay | Admitting: Internal Medicine

## 2022-12-24 DIAGNOSIS — E118 Type 2 diabetes mellitus with unspecified complications: Secondary | ICD-10-CM

## 2022-12-24 NOTE — Telephone Encounter (Signed)
Patient saw Hetty Blend on 12/22/22 for a hospital follow up. His wife is concerned about his drain and would like a call back to advise. Best callback for [name] is [phone].

## 2022-12-24 NOTE — Telephone Encounter (Signed)
Pt wife called back wanting to speak with Dahlia Client. Please advise.

## 2022-12-24 NOTE — Telephone Encounter (Signed)
Patient saw Hetty Blend on 12/23/2022 and  he made appointment to see you in February - can you please send refills until his appointment with you.

## 2022-12-24 NOTE — Telephone Encounter (Signed)
Patient would like to be called when this is filled or not -  [phone]

## 2022-12-24 NOTE — Telephone Encounter (Signed)
LM for [name] with Vickie's response. Provided office number for questions/concerns.

## 2022-12-24 NOTE — Telephone Encounter (Signed)
Looks like per ov notes he declined home health? Please advise

## 2022-12-25 ENCOUNTER — Other Ambulatory Visit: Payer: Self-pay | Admitting: Family Medicine

## 2022-12-25 ENCOUNTER — Telehealth: Payer: Self-pay | Admitting: Internal Medicine

## 2022-12-25 DIAGNOSIS — E119 Type 2 diabetes mellitus without complications: Secondary | ICD-10-CM

## 2022-12-25 DIAGNOSIS — R5381 Other malaise: Secondary | ICD-10-CM

## 2022-12-25 DIAGNOSIS — I480 Paroxysmal atrial fibrillation: Secondary | ICD-10-CM

## 2022-12-25 DIAGNOSIS — A419 Sepsis, unspecified organism: Secondary | ICD-10-CM

## 2022-12-25 DIAGNOSIS — B9620 Unspecified Escherichia coli [E. coli] as the cause of diseases classified elsewhere: Secondary | ICD-10-CM

## 2022-12-25 NOTE — Telephone Encounter (Signed)
Fyi:  patient called and said that the surgeon's office will not do anything about his gall bladder until his appointment on 01/16/2023

## 2022-12-25 NOTE — Telephone Encounter (Signed)
FYI.. called Patrick Kidd and she verbalized understanding and states it was the IR team that sent her to [place] to answer the drainage questions but will try to reach out to the surgery team on [facility] w those answers but if they can't answer she will call us back as she states she really would like to know.   Requests that the [facility] be ordered to start ASAP/Urgent so she can discuss w them her questions and get the help she needs so this doesn't become an ongoing issue.

## 2022-12-25 NOTE — Telephone Encounter (Signed)
Patient's wife [name] returned Hannah's call and would like a call back. Best callback for patient is [phone]. Best callback for [name] is [phone].

## 2022-12-25 NOTE — Telephone Encounter (Signed)
Spoke w pt's wife and she states they would like the outpatient PT cancelled and instead would like the Home Health nurse ordered for help w both the drain and to see if she can also do PT at home as well? They want to make sure this is ordered as soon as possible as even though she thinks she is doing everything correctly she has some concerns since she is not used to this.   She did have some questions regarding the drain as she reports his drainage was down the 19th at about 5 mL and then last night it was up at 1/2 an ounce and wants to know if they should be concerned?   She also wants to make sure she is doing everything correct as they told her at the hospital to flush the drain w 5 mL of saline into the drain towards the body and if there is any resistance that means there might be a clog. She just wants to make sure that is correct?

## 2022-12-25 NOTE — Telephone Encounter (Signed)
Patient's wife called back and is upset with what Dahlia Client told her. She said her husband should've been able to have this taken care of by his PCP's office and that he should have to speak with the surgeon. [name] said that she would like a call back today from Dr. Yetta Barre' nurse. Best callback is [phone].

## 2022-12-25 NOTE — Telephone Encounter (Signed)
Message has been sent to Dr. Yetta Barre, please see other encounter

## 2022-12-25 NOTE — Telephone Encounter (Signed)
I called the wife and gave her Patrick Kidd's response that they need to reach out to general surgery w the drainage questions but wife is upset and is stating this needs to be handled by PCP office, she has a few questions regarding the drain below and wants answers from [facility] nurse but I can call in [place] absence:  she reports his drainage was down the 19th at about 5 mL and then last night it was up at 1/2 an ounce and wants to know if they should be concerned?  She also wants to make sure she is doing everything correct as they told her at the hospital to flush the drain w 5 mL of saline into the drain towards the body and if there is any resistance that means there might be a clog. She just wants to make sure that is correct?

## 2022-12-26 ENCOUNTER — Other Ambulatory Visit (HOSPITAL_COMMUNITY): Payer: Self-pay | Admitting: Student

## 2022-12-26 ENCOUNTER — Encounter (HOSPITAL_COMMUNITY): Payer: Self-pay | Admitting: Radiology

## 2022-12-26 ENCOUNTER — Ambulatory Visit (HOSPITAL_COMMUNITY)
Admission: RE | Admit: 2022-12-26 | Discharge: 2022-12-26 | Disposition: A | Discharge status: Home / Self Care | Payer: Medicare Other | Source: Ambulatory Visit | Attending: Student | Admitting: Student

## 2022-12-26 ENCOUNTER — Other Ambulatory Visit: Payer: Self-pay

## 2022-12-26 DIAGNOSIS — K828 Other specified diseases of gallbladder: Secondary | ICD-10-CM

## 2022-12-26 HISTORY — PX: IR PATIENT EVAL TECH 0-60 MINS: IMG5564

## 2022-12-26 NOTE — Patient Outreach (Signed)
Care Management  Transitions of Care Program Transitions of Care Post-discharge week 3   12/26/2022 Name: Patrick Kidd MRN: [id number] DOB: 1945/04/29  Subjective: Patrick Kidd is a 77 y.o. year old male who is a primary care patient of Etta Grandchild, MD. The Care Management team Engaged with patient Engaged with patient by telephone to assess and address transitions of care needs.   Consent to Services:  Patient was given information about care management services, agreed to services, and gave verbal consent to participate.   Assessment: Call placed to patient who states that he and his wife are very frustrated.  Wife flushed biliary drain and does wound care. Wife is concerned that she is not doing it correctly. Patient was seen at PCP office this week and a home health order was place for nursing and PT.  Spoke with patient today and he is not homebound. Reviewed my concern with patient that home health is for home bound patients.  Patient reports no lower extremity swelling at this time and no other concerns. Reports that he is taking his medications as prescribed.           SDOH Interventions    Flowsheet Row Telephone from 12/12/2022 in [place] POPULATION HEALTH DEPARTMENT Telephone from 06/05/2022 in [facility] Community Care Coordination Clinical Support from 12/02/2021 in [facility] Chronic Care Management from 04/19/2020 in [facility] at [place]  SDOH Interventions      Food Insecurity Interventions Intervention Not Indicated Intervention Not Indicated Intervention Not Indicated --  Housing Interventions Intervention Not Indicated -- Intervention Not Indicated --  Transportation Interventions Intervention Not Indicated Intervention Not Indicated  [normally drives self,  wife assists as needed after recent hospitalization] Intervention Not Indicated --  Utilities Interventions -- --  Intervention Not Indicated --  Alcohol Usage Interventions -- -- Intervention Not Indicated (Score <7) --  Financial Strain Interventions -- -- Intervention Not Indicated Other (Comment)  [Farxiga PAP approved. Lovenia Kim PAP]  Physical Activity Interventions -- -- Intervention Not Indicated --  Social Connections Interventions -- -- Intervention Not Indicated --        Goals Addressed               This Visit's Progress     Patietn will report no readmissions to the hospital in the next 30 days. (pt-stated)        Current Barriers:  Provider appointments no PCP follow up. Patient had questions initially about swelling in legs. 12/26/2022.  Patient admits that wife spoke with Interventional Radiology PA last week and wife still has concerns.  Patient seen at PCP office this week and order placed for home health.  Patient admits that he is not home bound.  Equipment/DME Patient reports that he does not know how to manage his drain. 12/26/2022  Patient and wife still have questions about drain care.  RNCM Clinical Goal(s):  Patient will work with the Care Management team over the next 30 days to address Transition of Care Barriers: Provider appointments Equipment/DME demonstrate understanding of rationale for each prescribed medication as evidenced by patient report attend all scheduled medical appointments: PCP and specialist as evidenced by review of EMR and patient report  through collaboration with RN Care manager, provider, and care team.   Interventions: Evaluation of current treatment plan related to  self management and patient's adherence to plan as established by provider Surgery (Percutaneous Cholecystostomy drain/sepsis):  (Status: New  goal.) Short Term Goal Evaluation of current treatment plan related to cholecystomy  surgery reviewed post-operative instructions with patient/caregiver addressed questions about post - surgical incision care  reviewed scheduled provider  appointments with patient:  encoruraged follow up as recommended. contacted provider to discuss 12/12/2022 Fluid overload Placed call to Interventional radiology and spoke with Baird Lyons who will call the patient. 12/26/2022 call back to IR to inquire if patient can have a nurse visit in the clinic.  Able to get a hospital follow up appointment scheduled for 12/22/2022. Reviewed with patient time of appointment. 12/26/2022  Confirmed patient was seen in the PCP office. Encouraged patient to discuss OT and PT at office visit on 12/22/2022 Reviewed with patient that he has no pain. Spoke with Victorino Dike in IR and reviewed case and patient and wife's concerns. Call back from [place] who states that patient will go to [facility] today at 3pm to be evaulated. Reports PA spoke with patient. I called patient back to confirm and he also states that home health is coming to see him too.   Patient Goals/Self-Care Activities: Participate in Transition of Care Program/Attend [facility] scheduled calls Notify RN Care Manager of TOC call rescheduling needs Take all medications as prescribed Attend all scheduled provider appointments  Follow Up Plan:  Telephone follow up appointment with care management team member scheduled for:  01/02/2023          Plan:  Follow up with patient via phone in 1 week.  01/02/2023  Lonia Chimera, RN, BSN, CEN Population Health- Transition of Care Team.  [facility] [phone]

## 2022-12-26 NOTE — Procedures (Signed)
Patient ID: Patrick Kidd, male   DOB: 12/02/1945, 77 y.o.   MRN: [id number] Pt /spouse presents to [employer] IR dept today for further teaching of how to manage/flush GB drain which was placed on 12/09/22. On exam pt is awake/alert; GB drain intact, insertion site ok, not sig tender; green bile in bag; drain flushes easily and with return of green bile; no reflux noted; technique for drain flushing again d/w pt/spouse as well as drain care. All questions answered. New gauze dressing applied over drain site. Pt scheduled for IR f/u cholangiogram on 01/16/23.

## 2022-12-26 NOTE — Progress Notes (Signed)
Patient ID: Patrick Kidd, male   DOB: [birth year], 77 y.o.   MRN: [id number] Pt /spouse presents to [employer] IR dept today for further teaching of how to manage/flush GB drain which was placed on 12/09/22. On exam pt is awake/alert; GB drain intact, insertion site ok, not sig tender; green bile in bag; drain flushes easily and with return of green bile; no reflux noted; technique for drain flushing again d/w pt/spouse as well as drain care. All questions answered. New gauze dressing applied over drain site. Pt scheduled for IR f/u cholangiogram on 01/16/23.

## 2022-12-28 DIAGNOSIS — K219 Gastro-esophageal reflux disease without esophagitis: Secondary | ICD-10-CM | POA: Diagnosis not present

## 2022-12-28 DIAGNOSIS — E1142 Type 2 diabetes mellitus with diabetic polyneuropathy: Secondary | ICD-10-CM | POA: Diagnosis not present

## 2022-12-28 DIAGNOSIS — Z87891 Personal history of nicotine dependence: Secondary | ICD-10-CM | POA: Diagnosis not present

## 2022-12-28 DIAGNOSIS — B9620 Unspecified Escherichia coli [E. coli] as the cause of diseases classified elsewhere: Secondary | ICD-10-CM | POA: Diagnosis not present

## 2022-12-28 DIAGNOSIS — M109 Gout, unspecified: Secondary | ICD-10-CM | POA: Diagnosis not present

## 2022-12-28 DIAGNOSIS — Z9841 Cataract extraction status, right eye: Secondary | ICD-10-CM | POA: Diagnosis not present

## 2022-12-28 DIAGNOSIS — D696 Thrombocytopenia, unspecified: Secondary | ICD-10-CM | POA: Diagnosis not present

## 2022-12-28 DIAGNOSIS — K810 Acute cholecystitis: Secondary | ICD-10-CM | POA: Diagnosis not present

## 2022-12-28 DIAGNOSIS — K766 Portal hypertension: Secondary | ICD-10-CM | POA: Diagnosis not present

## 2022-12-28 DIAGNOSIS — N400 Enlarged prostate without lower urinary tract symptoms: Secondary | ICD-10-CM | POA: Diagnosis not present

## 2022-12-28 DIAGNOSIS — Z4803 Encounter for change or removal of drains: Secondary | ICD-10-CM | POA: Diagnosis not present

## 2022-12-28 DIAGNOSIS — K7030 Alcoholic cirrhosis of liver without ascites: Secondary | ICD-10-CM | POA: Diagnosis not present

## 2022-12-28 DIAGNOSIS — I130 Hypertensive heart and chronic kidney disease with heart failure and stage 1 through stage 4 chronic kidney disease, or unspecified chronic kidney disease: Secondary | ICD-10-CM | POA: Diagnosis not present

## 2022-12-28 DIAGNOSIS — I509 Heart failure, unspecified: Secondary | ICD-10-CM | POA: Diagnosis not present

## 2022-12-28 DIAGNOSIS — M1990 Unspecified osteoarthritis, unspecified site: Secondary | ICD-10-CM | POA: Diagnosis not present

## 2022-12-28 DIAGNOSIS — G4730 Sleep apnea, unspecified: Secondary | ICD-10-CM | POA: Diagnosis not present

## 2022-12-28 DIAGNOSIS — Z856 Personal history of leukemia: Secondary | ICD-10-CM | POA: Diagnosis not present

## 2022-12-28 DIAGNOSIS — E1122 Type 2 diabetes mellitus with diabetic chronic kidney disease: Secondary | ICD-10-CM | POA: Diagnosis not present

## 2022-12-28 DIAGNOSIS — N1831 Chronic kidney disease, stage 3a: Secondary | ICD-10-CM | POA: Diagnosis not present

## 2022-12-28 DIAGNOSIS — Z860100 Personal history of colon polyps, unspecified: Secondary | ICD-10-CM | POA: Diagnosis not present

## 2022-12-28 DIAGNOSIS — I480 Paroxysmal atrial fibrillation: Secondary | ICD-10-CM | POA: Diagnosis not present

## 2022-12-28 DIAGNOSIS — Z794 Long term (current) use of insulin: Secondary | ICD-10-CM | POA: Diagnosis not present

## 2022-12-28 DIAGNOSIS — K5790 Diverticulosis of intestine, part unspecified, without perforation or abscess without bleeding: Secondary | ICD-10-CM | POA: Diagnosis not present

## 2022-12-28 DIAGNOSIS — Z9842 Cataract extraction status, left eye: Secondary | ICD-10-CM | POA: Diagnosis not present

## 2022-12-28 DIAGNOSIS — E669 Obesity, unspecified: Secondary | ICD-10-CM | POA: Diagnosis not present

## 2022-12-29 NOTE — Telephone Encounter (Signed)
Home health has already came to the house and she does feel so much better since

## 2022-12-30 ENCOUNTER — Telehealth: Payer: Self-pay

## 2022-12-30 DIAGNOSIS — B9620 Unspecified Escherichia coli [E. coli] as the cause of diseases classified elsewhere: Secondary | ICD-10-CM | POA: Diagnosis not present

## 2022-12-30 DIAGNOSIS — E1122 Type 2 diabetes mellitus with diabetic chronic kidney disease: Secondary | ICD-10-CM | POA: Diagnosis not present

## 2022-12-30 DIAGNOSIS — K810 Acute cholecystitis: Secondary | ICD-10-CM | POA: Diagnosis not present

## 2022-12-30 DIAGNOSIS — K7030 Alcoholic cirrhosis of liver without ascites: Secondary | ICD-10-CM | POA: Diagnosis not present

## 2022-12-30 DIAGNOSIS — I130 Hypertensive heart and chronic kidney disease with heart failure and stage 1 through stage 4 chronic kidney disease, or unspecified chronic kidney disease: Secondary | ICD-10-CM | POA: Diagnosis not present

## 2022-12-30 DIAGNOSIS — I509 Heart failure, unspecified: Secondary | ICD-10-CM | POA: Diagnosis not present

## 2022-12-30 NOTE — Patient Outreach (Signed)
  Care Management  Transitions of Care Program Transitions of Care Post-discharge week 3  12/30/2022 Name: Patrick Kidd MRN: [id number] DOB: Dec 04, 1945  Subjective: Patrick Kidd is a 77 y.o. year old male who is a primary care patient of Etta Grandchild, MD. The Care Management team was unable to reach the patient by phone to assess and address transitions of care needs.   Plan: Additional outreach attempts will be made to reach the patient enrolled in the [facility] Program (Post Inpatient/ED Visit).  Lonia Chimera, RN, BSN, CEN [employer]- Transition of Care Team.  [facility] [phone]

## 2022-12-31 ENCOUNTER — Telehealth: Payer: Self-pay

## 2022-12-31 NOTE — Patient Outreach (Signed)
  Care Management  Transitions of Care Program Transitions of Care Post-discharge week 3  12/31/2022 Name: Patrick Kidd MRN: [id number] DOB: September 26, 1945  Subjective: Patrick Kidd is a 77 y.o. year old male who is a primary care patient of Etta Grandchild, MD. The Care Management team was unable to reach the patient by phone to assess and address transitions of care needs.   Plan: Additional outreach attempts will be made to reach the patient enrolled in the [facility] Program (Post Inpatient/ED Visit).  Lonia Chimera, RN, BSN, CEN [employer]- Transition of Care Team.  [facility] [phone]

## 2023-01-02 ENCOUNTER — Other Ambulatory Visit: Payer: Medicare Other

## 2023-01-02 DIAGNOSIS — B9620 Unspecified Escherichia coli [E. coli] as the cause of diseases classified elsewhere: Secondary | ICD-10-CM | POA: Diagnosis not present

## 2023-01-02 DIAGNOSIS — I509 Heart failure, unspecified: Secondary | ICD-10-CM | POA: Diagnosis not present

## 2023-01-02 DIAGNOSIS — E1122 Type 2 diabetes mellitus with diabetic chronic kidney disease: Secondary | ICD-10-CM | POA: Diagnosis not present

## 2023-01-02 DIAGNOSIS — K810 Acute cholecystitis: Secondary | ICD-10-CM | POA: Diagnosis not present

## 2023-01-02 DIAGNOSIS — K7030 Alcoholic cirrhosis of liver without ascites: Secondary | ICD-10-CM | POA: Diagnosis not present

## 2023-01-02 DIAGNOSIS — I130 Hypertensive heart and chronic kidney disease with heart failure and stage 1 through stage 4 chronic kidney disease, or unspecified chronic kidney disease: Secondary | ICD-10-CM | POA: Diagnosis not present

## 2023-01-05 ENCOUNTER — Telehealth: Payer: Self-pay

## 2023-01-05 NOTE — Patient Outreach (Signed)
Care Management  Transitions of Care Program Transitions of Care Post-discharge week 4   01/05/2023 Name: Patrick Kidd MRN: [id number] DOB: 30-Jun-1945  Subjective: Patrick Kidd is a 77 y.o. year old male who is a primary care patient of Etta Grandchild, MD. The Care Management team Engaged with patient Engaged with patient by telephone to assess and address transitions of care needs.   Consent to Services:  Patient was given information about care management services, agreed to services, and gave verbal consent to participate.   Assessment: Patient reports that he is doing well. Reports that his wife is managing drain well. Reports that instruction from the hospital was about 30 minutes and all questions were answered. Patient also reports that he has a home health nurse coming once a week to his home to manage drain as well. Patient reports that home health nurse thinks patient passed a gall stone last week. Patient denies any new problems or concerns .  Goals met.           SDOH Interventions    Flowsheet Row Telephone from 12/12/2022 in [place] POPULATION HEALTH DEPARTMENT Telephone from 06/05/2022 in [facility] Community Care Coordination Clinical Support from 12/02/2021 in [facility] [place] [facility] Chronic Care Management from 04/19/2020 in [facility] [place]  SDOH Interventions      Food Insecurity Interventions Intervention Not Indicated Intervention Not Indicated Intervention Not Indicated --  Housing Interventions Intervention Not Indicated -- Intervention Not Indicated --  Transportation Interventions Intervention Not Indicated Intervention Not Indicated  [normally drives self,  wife assists as needed after recent hospitalization] Intervention Not Indicated --  Utilities Interventions -- -- Intervention Not Indicated --  Alcohol Usage Interventions -- -- Intervention Not Indicated (Score <7) --  Financial  Strain Interventions -- -- Intervention Not Indicated Other (Comment)  [Farxiga PAP approved. Lovenia Kim PAP]  Physical Activity Interventions -- -- Intervention Not Indicated --  Social Connections Interventions -- -- Intervention Not Indicated --        Goals Addressed               This Visit's Progress     COMPLETED: Patient will report no readmissions to the hospital in the next 30 days. (pt-stated)        Current Barriers:  Provider appointments no PCP follow up. Patient had questions initially about swelling in legs. 12/26/2022.  Patient admits that wife spoke with Interventional Radiology PA last week and wife still has concerns.  Patient seen at PCP office this week and order placed for home health.  Patient admits that he is not home bound.  Equipment/DME Patient reports that he does not know how to manage his drain. 12/26/2022  Patient and wife still have questions about drain care.121/03/2022  all questions answered, Home health is active.   RNCM Clinical Goal(s):  Patient will work with the Care Management team over the next 30 days to address Transition of Care Barriers: Provider appointments Equipment/DME demonstrate understanding of rationale for each prescribed medication as evidenced by patient report attend all scheduled medical appointments: PCP and specialist as evidenced by review of EMR and patient report  through collaboration with RN Care manager, provider, and care team.   Interventions: Evaluation of current treatment plan related to  self management and patient's adherence to plan as established by provider Surgery (Percutaneous Cholecystostomy drain/sepsis):  (Status: Goal Met.) Short Term Goal Encouraged patient to call MD for any  changes in condition.  Patient Goals/Self-Care Activities: Take all medications as prescribed Attend all scheduled provider appointments  Follow Up Plan:  No further follow up required: goals met          Plan:  Goals  met.  Patient will cal MD if needed in the future.  Lonia Chimera, RN, BSN, CEN [employer]- Transition of Care Team.  [facility] [phone]

## 2023-01-08 DIAGNOSIS — E1122 Type 2 diabetes mellitus with diabetic chronic kidney disease: Secondary | ICD-10-CM | POA: Diagnosis not present

## 2023-01-08 DIAGNOSIS — K810 Acute cholecystitis: Secondary | ICD-10-CM | POA: Diagnosis not present

## 2023-01-08 DIAGNOSIS — I130 Hypertensive heart and chronic kidney disease with heart failure and stage 1 through stage 4 chronic kidney disease, or unspecified chronic kidney disease: Secondary | ICD-10-CM | POA: Diagnosis not present

## 2023-01-08 DIAGNOSIS — B9620 Unspecified Escherichia coli [E. coli] as the cause of diseases classified elsewhere: Secondary | ICD-10-CM | POA: Diagnosis not present

## 2023-01-08 DIAGNOSIS — I509 Heart failure, unspecified: Secondary | ICD-10-CM | POA: Diagnosis not present

## 2023-01-08 DIAGNOSIS — K7030 Alcoholic cirrhosis of liver without ascites: Secondary | ICD-10-CM | POA: Diagnosis not present

## 2023-01-15 DIAGNOSIS — K8020 Calculus of gallbladder without cholecystitis without obstruction: Secondary | ICD-10-CM | POA: Diagnosis not present

## 2023-01-16 ENCOUNTER — Ambulatory Visit
Admission: RE | Admit: 2023-01-16 | Discharge: 2023-01-16 | Disposition: A | Discharge status: Home / Self Care | Payer: Medicare Other | Source: Ambulatory Visit | Attending: Student | Admitting: Student

## 2023-01-16 ENCOUNTER — Ambulatory Visit
Admission: RE | Admit: 2023-01-16 | Discharge: 2023-01-16 | Disposition: A | Discharge status: Home / Self Care | Payer: Medicare Other | Source: Ambulatory Visit | Attending: General Surgery | Admitting: General Surgery

## 2023-01-16 ENCOUNTER — Other Ambulatory Visit (HOSPITAL_COMMUNITY): Payer: Self-pay

## 2023-01-16 ENCOUNTER — Telehealth (HOSPITAL_COMMUNITY): Payer: Self-pay | Admitting: Student

## 2023-01-16 DIAGNOSIS — K828 Other specified diseases of gallbladder: Secondary | ICD-10-CM | POA: Diagnosis not present

## 2023-01-16 DIAGNOSIS — K8001 Calculus of gallbladder with acute cholecystitis with obstruction: Secondary | ICD-10-CM | POA: Diagnosis not present

## 2023-01-16 DIAGNOSIS — I509 Heart failure, unspecified: Secondary | ICD-10-CM | POA: Diagnosis not present

## 2023-01-16 DIAGNOSIS — I130 Hypertensive heart and chronic kidney disease with heart failure and stage 1 through stage 4 chronic kidney disease, or unspecified chronic kidney disease: Secondary | ICD-10-CM | POA: Diagnosis not present

## 2023-01-16 DIAGNOSIS — K7030 Alcoholic cirrhosis of liver without ascites: Secondary | ICD-10-CM | POA: Diagnosis not present

## 2023-01-16 DIAGNOSIS — K810 Acute cholecystitis: Secondary | ICD-10-CM | POA: Diagnosis not present

## 2023-01-16 DIAGNOSIS — B9620 Unspecified Escherichia coli [E. coli] as the cause of diseases classified elsewhere: Secondary | ICD-10-CM | POA: Diagnosis not present

## 2023-01-16 DIAGNOSIS — E1122 Type 2 diabetes mellitus with diabetic chronic kidney disease: Secondary | ICD-10-CM | POA: Diagnosis not present

## 2023-01-16 HISTORY — PX: IR RADIOLOGIST EVAL & MGMT: IMG5224

## 2023-01-16 MED ORDER — NORMAL SALINE FLUSH 0.9 % IV SOLN
10.0000 mL | Freq: Every day | INTRAVENOUS | 3 refills | Status: DC
  Filled 2023-01-16: qty 300, 30d supply, fill #0

## 2023-01-16 MED ORDER — NORMAL SALINE FLUSH 0.9 % IV SOLN
INTRAVENOUS | 0 refills | Status: DC
  Filled 2023-01-16: qty 450, fill #0

## 2023-01-16 NOTE — Progress Notes (Addendum)
Chief Complaint: Patient was seen in follow up for perc chole drain   Supervising Physician: Gilmer Mor   Patient Status: [facility] - In-pt   History of Present Illness: Patrick Kidd is a 77 y.o. male presenting to the clinic with history of acute cholecystitis.  Positive imaging and HIDA.  He was treated with perc chole 12/09/2022 based on surgery assessment of Non-surgical candidate.  Anesthesia was arrange for the case based on his confusion/AMS. This was performed with MAC.   He was discharged 12/10/22.   Since then, they have taken excellent care of the drain.  They have been flushing every evening with 5cc of sterile saline. They have kept a log of output, ranging daily output of ~5cc to >80cc.  Yesterday the output was about 20-30cc, and was dark green/brownish thin fluid.   He is feeling better.  They had surgery appointment yesterday locally. They will get a referral to [facility] for opinion of high risk cholecystectomy.    Injection today performed shows drain in the correct location.  Cystic duct remains occluded.   Past Medical History:  Diagnosis Date   Acrophobia    Alcoholic cirrhosis (HCC) 10/04/2015   Anemia    Arthritis    foot by big toe   BPH associated with nocturia    Cataract    removed both eyes   Cholecystitis 05/2022   tx Abx   Chronic cough    PMH of   CLL (chronic lymphocytic leukemia) (HCC)    COVID-19 12/2018   Diabetes mellitus without complication (HCC)    Diverticulosis 07/03/2010   Colonoscopy.    Duodenal ulcer 2017   Fallen arches    Bilateral   GERD (gastroesophageal reflux disease)    Gout    Granuloma annulare    Hx of adenomatous colonic polyps multiple   Hydrocele 2011   Large septated right hydrocele   Liver cyst    Liver lesion    Nonspecific elevation of levels of transaminase or lactic acid dehydrogenase (LDH)    Obesity    Peripheral neuropathy    Plantar fasciitis    PMH of   Portal hypertension (HCC) 2017    Prostate cancer (HCC)    Right shoulder pain 11/2017   Sleep apnea    no cpap, patient denies   Thiamine deficiency    Wears reading eyeglasses    WPW (Wolff-Parkinson-White syndrome) 02/05/2019    Past Surgical History:  Procedure Laterality Date   BACK SURGERY  04/28/2022   CATARACT EXTRACTION, BILATERAL  12/2011   Dr Nile Riggs   COLONOSCOPY  2017   COLONOSCOPY W/ POLYPECTOMY  07/03/2010   2 adenomas, diverticulosis on right. Dr Leone Payor   CYSTOSCOPY N/A 03/15/2018   Procedure: Derinda Late;  Surgeon: Crista Elliot, MD;  [facility];  Service: Urology;  Laterality: N/A;  NO SEEDS FOUND IN BLADDER   ESOPHAGOGASTRODUODENOSCOPY (EGD) WITH PROPOFOL N/A 01/08/2016   Procedure: ESOPHAGOGASTRODUODENOSCOPY (EGD) WITH PROPOFOL;  Surgeon: Meryl Dare, MD;  Location: [facility];  Service: Endoscopy;  Laterality: N/A;   FLEXIBLE SIGMOIDOSCOPY  2000   IR PATIENT EVAL TECH 0-60 MINS  12/26/2022   IR PERC CHOLECYSTOSTOMY  12/05/2022   IR PERC CHOLECYSTOSTOMY  12/09/2022   RADIOACTIVE SEED IMPLANT N/A 03/15/2018   Procedure: RADIOACTIVE SEED IMPLANT/BRACHYTHERAPY IMPLANT;  Surgeon: Crista Elliot, MD;  Location: [facility] [place];  Service: Urology;  Laterality: N/A;   73 SEEDS IMPLANTED   RADIOLOGY  WITH ANESTHESIA N/A 12/09/2022   Procedure: IR WITH ANESTHESIA;  Surgeon: Radiologist, Medication, MD;  Location: [facility];  Service: Radiology;  Laterality: N/A;   SIGMOIDOSCOPY     SPACE OAR INSTILLATION N/A 03/15/2018   Procedure: SPACE OAR INSTILLATION;  Surgeon: Crista Elliot, MD;  Location: [facility];  Service: Urology;  Laterality: N/A;   WISDOM TOOTH EXTRACTION      Allergies: Rifaximin, Beta adrenergic blockers, and Calcium channel blockers  Medications: Prior to Admission medications   Medication Sig Start Date End Date Taking? Authorizing Provider  acetaminophen (TYLENOL) 500 MG tablet Take 1 tablet (500 mg total)  by mouth every 6 (six) hours as needed. Patient taking differently: Take 1,000 mg by mouth every 6 (six) hours as needed for moderate pain (pain score 4-6) or mild pain (pain score 1-3). 12/02/22   Derwood Kaplan, MD  Continuous Blood Gluc Receiver (FREESTYLE LIBRE 3 READER) DEVI 1 Act by Does not apply route daily. 05/07/22   Etta Grandchild, MD  Continuous Blood Gluc Sensor (FREESTYLE LIBRE 3 SENSOR) MISC 1 Act by Does not apply route daily. Place 1 sensor on the skin every 14 days. Use to check glucose continuously 05/07/22   Etta Grandchild, MD  gabapentin (NEURONTIN) 300 MG capsule Take 1 capsule (300 mg total) by mouth 3 (three) times daily. 09/16/22   Everlena Cooper, Adam R, DO  insulin glargine, 2 Unit Dial, (TOUJEO MAX SOLOSTAR) 300 UNIT/ML Solostar Pen Inject 12 Units into the skin daily. Via SANOFI pt assistance 12/10/22   Maretta Bees, MD  Insulin Pen Needle (PEN NEEDLES) 32G X 5 MM MISC Inject 1 Act into the skin daily. Use to administer insulin. Dx E11.9 Dispense based on insurance preference. 05/08/22   Etta Grandchild, MD  Lancets (ACCU-CHEK MULTICLIX) lancets Use to check sugar daily. DX: E11.8 12/21/17   Etta Grandchild, MD  metFORMIN (GLUCOPHAGE) 500 MG tablet TAKE 1 TABLET BY MOUTH TWICE A DAY WITH FOOD 12/24/22   Henson, Vickie L, NP-C  Multiple Vitamins-Minerals (CENTRUM SILVER 50+MEN) TABS Take 1 tablet by mouth daily.    [provider]  pantoprazole (PROTONIX) 40 MG tablet Take 1 tablet (40 mg total) by mouth 2 (two) times daily. TAKE 1 TABLET 30 MINUTES BEFORE MEALS (BREAKFAST AND SUPPER) TWICE A DAY 10/24/22   Etta Grandchild, MD  Sodium Chloride Flush (NORMAL SALINE FLUSH) 0.9 % SOLN Flush drain with 5ml once daily. 12/15/22   Gilmer Mor, DO     Family History  Problem Relation Age of Onset   Lung cancer Mother        smoker   Leukemia Father        Acute myelocytic   Diabetes Neg Hx    Stroke Neg Hx    Heart disease Neg Hx    Colon cancer Neg Hx    Colon polyps  Neg Hx    Esophageal cancer Neg Hx    Rectal cancer Neg Hx    Stomach cancer Neg Hx     Social History   Socioeconomic History   Marital status: Married    Spouse name: [name]   Number of children: 2   Years of education: Not on file   Highest education level: Some college, no degree  Occupational History   Occupation: Multimedia programmer estate  Tobacco Use   Smoking status: Former    Current packs/day: 0.00    Average packs/day: 2.0 packs/day for 6.0 years (12.0 ttl pk-yrs)  Types: Cigarettes    Start date: 02/03/1965    Quit date: 02/04/1971    Years since quitting: 51.9    Passive exposure: Never   Smokeless tobacco: Never   Tobacco comments:    smoked age [age], up to 2 ppd  Vaping Use   Vaping status: Never Used  Substance and Sexual Activity   Alcohol use: Not Currently    Comment: No alochol since Christmas Day 2020   Drug use: No   Sexual activity: Yes    Partners: Female  Other Topics Concern   Not on file  Social History Narrative   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 He lives with his wife in a one level home. He swims most days.   Social Drivers of Corporate investment banker Strain: Low Risk  (12/02/2021)   Overall Financial Resource Strain (CARDIA)    Difficulty of Paying Living Expenses: Not hard at all  Food Insecurity: No Food Insecurity (12/12/2022)   Hunger Vital Sign    Worried About Running Out of Food in the Last Year: Never true    Ran Out of Food in the Last Year: Never true  Transportation Needs: No Transportation Needs (12/12/2022)   PRAPARE - Administrator, Civil Service (Medical): No    Lack of Transportation (Non-Medical): No  Physical Activity: Inactive (12/02/2021)   Exercise Vital Sign    Days of Exercise per Week: 0 days    Minutes of Exercise per  Session: 0 min  Stress: No Stress Concern Present (12/02/2021)   [employer] of Occupational Health - Occupational Stress Questionnaire    Feeling of Stress : Not at all  Social Connections: Moderately Integrated (12/02/2021)   Social Connection and Isolation Panel [NHANES]    Frequency of Communication with Friends and Family: More than three times a week    Frequency of Social Gatherings with Friends and Family: More than three times a week    Attends Religious Services: Never    Database administrator or Organizations: Yes    Attends Engineer, structural: More than 4 times per year    Marital Status: Married       Review of Systems: A 12 point ROS discussed and pertinent positives are indicated in the HPI above.  All other systems are negative.  Review of Systems  Vital Signs: BP (!) 149/69   Pulse 68   Temp 98.5 F (36.9 C)   SpO2 98%       Physical Exam Exam shows the drain in place, with stitch intact, and no sign of infection.  Draining bile.   Mallampati Score:     Imaging: IR PATIENT EVAL TECH 0-60 MINS Result Date: 12/26/2022 Arby Barrette     12/26/2022  4:31 PM Patient ID: Coletta Memos, male   DOB: August 17, 1945, 77 y.o.   MRN: [id number] Pt /spouse presents to [employer] IR dept today for further teaching of how to manage/flush GB drain which was placed on 12/09/22. On exam pt is awake/alert; GB drain intact, insertion site ok, not sig tender; green bile in bag; drain flushes easily and with return of green bile; no reflux noted; technique for drain flushing again d/w pt/spouse as well as drain care. All questions answered. New gauze dressing applied over  drain site. Pt scheduled for IR f/u cholangiogram on 01/16/23.         Labs:  CBC: Recent Labs    12/08/22 0458 12/09/22 0423 12/10/22 0335 12/22/22 1527  WBC 6.8 7.0 6.5 7.6  HGB 11.4* 11.8* 11.4* 13.4  HCT 33.2* 33.7* 32.4* 39.0  PLT 63* 90* 108* 217.0    COAGS: Recent Labs     06/10/22 0959 12/03/22 1850 12/08/22 0834 12/09/22 0423  INR 1.3* 1.5* 1.1 1.2  APTT  --  32  --   --     BMP: Recent Labs    12/07/22 0344 12/08/22 0458 12/09/22 0423 12/10/22 0335 12/22/22 1527  NA 139 139 138 139 141  K 3.4* 3.5 3.2* 3.6 4.4  CL 108 109 109 109 103  CO2 21* 21* 20* 22 29  GLUCOSE 172* 155* 130* 135* 112*  BUN 24* 15 13 15 10   CALCIUM 8.0* 7.9* 7.9* 8.0* 9.4  CREATININE 1.12 1.01 0.97 1.01 1.01  GFRNONAA >60 >60 >60 >60  --     LIVER FUNCTION TESTS: Recent Labs    12/08/22 0458 12/09/22 0423 12/10/22 0335 12/22/22 1527  BILITOT 1.2* 1.3* 1.0 1.1  AST 37 25 18 21   ALT 25 23 20 13   ALKPHOS 65 72 71 96  PROT 5.1* 5.1* 4.9* 6.4  ALBUMIN 2.4* 2.4* 2.3* 4.0    TUMOR MARKERS: No results for input(s): "AFPTM", "CEA", "CA199", "CHROMGRNA" in the last 8760 hours.  Assessment and Plan:  77 yo male with perc chole performed 12/09/22.   He is being [facility] to [facility] for opinion of high risk cholecystectomy.    Drain and drain site in very nice condition.    We will set up for drain change in Mid January, which will be 2- 3 months. Hopefully by then he will have had opinion from [facility].   Plan: - routine drain change in Mid January at hospital - hopefully will have had opinion from [facility] on possible surgery.  After opinion, we can decide on whether he might be Spyglass candidate - continue current care, including routine drain changes  Electronically Signed: Gilmer Mor 01/16/2023, 11:20 AM   I spent a total of  30 Minutes   in face to face in clinical consultation, greater than 50% of which was counseling/coordinating care for perc chole, drain injection/care

## 2023-01-16 NOTE — Telephone Encounter (Signed)
Saline flushes e-prescribed to the [facility] outpatient pharmacy. Patient aware.   Patrick Kidd, [place] [phone] 01/16/2023, 12:34 PM

## 2023-01-17 ENCOUNTER — Other Ambulatory Visit (HOSPITAL_COMMUNITY): Payer: Self-pay

## 2023-01-19 ENCOUNTER — Other Ambulatory Visit (HOSPITAL_COMMUNITY): Payer: Self-pay | Admitting: Interventional Radiology

## 2023-01-19 DIAGNOSIS — K810 Acute cholecystitis: Secondary | ICD-10-CM

## 2023-01-20 DIAGNOSIS — L812 Freckles: Secondary | ICD-10-CM | POA: Diagnosis not present

## 2023-01-20 DIAGNOSIS — L821 Other seborrheic keratosis: Secondary | ICD-10-CM | POA: Diagnosis not present

## 2023-01-20 DIAGNOSIS — D1801 Hemangioma of skin and subcutaneous tissue: Secondary | ICD-10-CM | POA: Diagnosis not present

## 2023-01-20 DIAGNOSIS — D692 Other nonthrombocytopenic purpura: Secondary | ICD-10-CM | POA: Diagnosis not present

## 2023-01-21 ENCOUNTER — Other Ambulatory Visit (HOSPITAL_COMMUNITY): Payer: Self-pay

## 2023-01-23 ENCOUNTER — Ambulatory Visit (HOSPITAL_COMMUNITY)
Admission: RE | Admit: 2023-01-23 | Discharge: 2023-01-23 | Disposition: A | Discharge status: Home / Self Care | Payer: Medicare Other | Source: Ambulatory Visit | Attending: Interventional Radiology | Admitting: Interventional Radiology

## 2023-01-23 ENCOUNTER — Other Ambulatory Visit (HOSPITAL_COMMUNITY): Payer: Self-pay

## 2023-01-23 ENCOUNTER — Other Ambulatory Visit: Payer: Self-pay | Admitting: Internal Medicine

## 2023-01-23 ENCOUNTER — Other Ambulatory Visit (HOSPITAL_COMMUNITY): Payer: Self-pay | Admitting: Interventional Radiology

## 2023-01-23 DIAGNOSIS — K219 Gastro-esophageal reflux disease without esophagitis: Secondary | ICD-10-CM

## 2023-01-23 DIAGNOSIS — K810 Acute cholecystitis: Secondary | ICD-10-CM

## 2023-01-23 DIAGNOSIS — R053 Chronic cough: Secondary | ICD-10-CM

## 2023-01-27 DIAGNOSIS — M1990 Unspecified osteoarthritis, unspecified site: Secondary | ICD-10-CM | POA: Diagnosis not present

## 2023-01-27 DIAGNOSIS — E1122 Type 2 diabetes mellitus with diabetic chronic kidney disease: Secondary | ICD-10-CM | POA: Diagnosis not present

## 2023-01-27 DIAGNOSIS — Z87891 Personal history of nicotine dependence: Secondary | ICD-10-CM | POA: Diagnosis not present

## 2023-01-27 DIAGNOSIS — N1831 Chronic kidney disease, stage 3a: Secondary | ICD-10-CM | POA: Diagnosis not present

## 2023-01-27 DIAGNOSIS — G4730 Sleep apnea, unspecified: Secondary | ICD-10-CM | POA: Diagnosis not present

## 2023-01-27 DIAGNOSIS — K810 Acute cholecystitis: Secondary | ICD-10-CM | POA: Diagnosis not present

## 2023-01-27 DIAGNOSIS — E669 Obesity, unspecified: Secondary | ICD-10-CM | POA: Diagnosis not present

## 2023-01-27 DIAGNOSIS — I480 Paroxysmal atrial fibrillation: Secondary | ICD-10-CM | POA: Diagnosis not present

## 2023-01-27 DIAGNOSIS — D696 Thrombocytopenia, unspecified: Secondary | ICD-10-CM | POA: Diagnosis not present

## 2023-01-27 DIAGNOSIS — N400 Enlarged prostate without lower urinary tract symptoms: Secondary | ICD-10-CM | POA: Diagnosis not present

## 2023-01-27 DIAGNOSIS — Z4803 Encounter for change or removal of drains: Secondary | ICD-10-CM | POA: Diagnosis not present

## 2023-01-27 DIAGNOSIS — Z9842 Cataract extraction status, left eye: Secondary | ICD-10-CM | POA: Diagnosis not present

## 2023-01-27 DIAGNOSIS — K766 Portal hypertension: Secondary | ICD-10-CM | POA: Diagnosis not present

## 2023-01-27 DIAGNOSIS — I509 Heart failure, unspecified: Secondary | ICD-10-CM | POA: Diagnosis not present

## 2023-01-27 DIAGNOSIS — I130 Hypertensive heart and chronic kidney disease with heart failure and stage 1 through stage 4 chronic kidney disease, or unspecified chronic kidney disease: Secondary | ICD-10-CM | POA: Diagnosis not present

## 2023-01-27 DIAGNOSIS — K219 Gastro-esophageal reflux disease without esophagitis: Secondary | ICD-10-CM | POA: Diagnosis not present

## 2023-01-27 DIAGNOSIS — B9620 Unspecified Escherichia coli [E. coli] as the cause of diseases classified elsewhere: Secondary | ICD-10-CM | POA: Diagnosis not present

## 2023-01-27 DIAGNOSIS — E1142 Type 2 diabetes mellitus with diabetic polyneuropathy: Secondary | ICD-10-CM | POA: Diagnosis not present

## 2023-01-27 DIAGNOSIS — Z856 Personal history of leukemia: Secondary | ICD-10-CM | POA: Diagnosis not present

## 2023-01-27 DIAGNOSIS — K5790 Diverticulosis of intestine, part unspecified, without perforation or abscess without bleeding: Secondary | ICD-10-CM | POA: Diagnosis not present

## 2023-01-27 DIAGNOSIS — Z9841 Cataract extraction status, right eye: Secondary | ICD-10-CM | POA: Diagnosis not present

## 2023-01-27 DIAGNOSIS — K7030 Alcoholic cirrhosis of liver without ascites: Secondary | ICD-10-CM | POA: Diagnosis not present

## 2023-01-27 DIAGNOSIS — Z860100 Personal history of colon polyps, unspecified: Secondary | ICD-10-CM | POA: Diagnosis not present

## 2023-01-27 DIAGNOSIS — M109 Gout, unspecified: Secondary | ICD-10-CM | POA: Diagnosis not present

## 2023-01-27 DIAGNOSIS — Z794 Long term (current) use of insulin: Secondary | ICD-10-CM | POA: Diagnosis not present

## 2023-01-29 ENCOUNTER — Ambulatory Visit: Payer: Medicare Other | Admitting: Internal Medicine

## 2023-01-30 DIAGNOSIS — I509 Heart failure, unspecified: Secondary | ICD-10-CM | POA: Diagnosis not present

## 2023-01-30 DIAGNOSIS — E1122 Type 2 diabetes mellitus with diabetic chronic kidney disease: Secondary | ICD-10-CM | POA: Diagnosis not present

## 2023-01-30 DIAGNOSIS — B9620 Unspecified Escherichia coli [E. coli] as the cause of diseases classified elsewhere: Secondary | ICD-10-CM | POA: Diagnosis not present

## 2023-01-30 DIAGNOSIS — I130 Hypertensive heart and chronic kidney disease with heart failure and stage 1 through stage 4 chronic kidney disease, or unspecified chronic kidney disease: Secondary | ICD-10-CM | POA: Diagnosis not present

## 2023-01-30 DIAGNOSIS — K810 Acute cholecystitis: Secondary | ICD-10-CM | POA: Diagnosis not present

## 2023-01-30 DIAGNOSIS — K7030 Alcoholic cirrhosis of liver without ascites: Secondary | ICD-10-CM | POA: Diagnosis not present

## 2023-02-05 DIAGNOSIS — E1122 Type 2 diabetes mellitus with diabetic chronic kidney disease: Secondary | ICD-10-CM | POA: Diagnosis not present

## 2023-02-05 DIAGNOSIS — I130 Hypertensive heart and chronic kidney disease with heart failure and stage 1 through stage 4 chronic kidney disease, or unspecified chronic kidney disease: Secondary | ICD-10-CM | POA: Diagnosis not present

## 2023-02-05 DIAGNOSIS — B9620 Unspecified Escherichia coli [E. coli] as the cause of diseases classified elsewhere: Secondary | ICD-10-CM | POA: Diagnosis not present

## 2023-02-05 DIAGNOSIS — I509 Heart failure, unspecified: Secondary | ICD-10-CM | POA: Diagnosis not present

## 2023-02-05 DIAGNOSIS — K7030 Alcoholic cirrhosis of liver without ascites: Secondary | ICD-10-CM | POA: Diagnosis not present

## 2023-02-05 DIAGNOSIS — K810 Acute cholecystitis: Secondary | ICD-10-CM | POA: Diagnosis not present

## 2023-02-11 ENCOUNTER — Ambulatory Visit: Payer: Medicare Other | Admitting: Internal Medicine

## 2023-02-11 DIAGNOSIS — K7030 Alcoholic cirrhosis of liver without ascites: Secondary | ICD-10-CM | POA: Diagnosis not present

## 2023-02-11 DIAGNOSIS — K819 Cholecystitis, unspecified: Secondary | ICD-10-CM | POA: Diagnosis not present

## 2023-02-17 ENCOUNTER — Other Ambulatory Visit (HOSPITAL_COMMUNITY): Payer: Self-pay | Admitting: Interventional Radiology

## 2023-02-17 ENCOUNTER — Ambulatory Visit (HOSPITAL_COMMUNITY)
Admission: RE | Admit: 2023-02-17 | Discharge: 2023-02-17 | Disposition: A | Discharge status: Home / Self Care | Payer: Medicare Other | Source: Ambulatory Visit | Attending: Interventional Radiology

## 2023-02-17 DIAGNOSIS — K819 Cholecystitis, unspecified: Secondary | ICD-10-CM

## 2023-02-17 DIAGNOSIS — K810 Acute cholecystitis: Secondary | ICD-10-CM | POA: Insufficient documentation

## 2023-02-17 DIAGNOSIS — Z434 Encounter for attention to other artificial openings of digestive tract: Secondary | ICD-10-CM | POA: Insufficient documentation

## 2023-02-17 HISTORY — PX: IR CATHETER TUBE CHANGE: IMG717

## 2023-02-17 HISTORY — PX: IR EXCHANGE BILIARY DRAIN: IMG6046

## 2023-02-17 MED ORDER — LIDOCAINE-EPINEPHRINE 1 %-1:100000 IJ SOLN
20.0000 mL | Freq: Once | INTRAMUSCULAR | Status: AC
  Administered 2023-02-17: 10 mL via INTRADERMAL

## 2023-02-17 MED ORDER — LIDOCAINE-EPINEPHRINE 1 %-1:100000 IJ SOLN
INTRAMUSCULAR | Status: AC
  Filled 2023-02-17: qty 1

## 2023-02-17 MED ORDER — IOHEXOL 300 MG/ML  SOLN
50.0000 mL | Freq: Once | INTRAMUSCULAR | Status: AC | PRN
  Administered 2023-02-17: 15 mL

## 2023-02-18 ENCOUNTER — Other Ambulatory Visit (HOSPITAL_COMMUNITY): Payer: Self-pay | Admitting: Interventional Radiology

## 2023-02-18 ENCOUNTER — Encounter (HOSPITAL_COMMUNITY): Payer: Self-pay

## 2023-02-18 ENCOUNTER — Ambulatory Visit (HOSPITAL_COMMUNITY): Payer: Medicare Other

## 2023-02-18 DIAGNOSIS — K810 Acute cholecystitis: Secondary | ICD-10-CM

## 2023-02-24 DIAGNOSIS — K810 Acute cholecystitis: Secondary | ICD-10-CM | POA: Diagnosis not present

## 2023-02-24 DIAGNOSIS — I509 Heart failure, unspecified: Secondary | ICD-10-CM | POA: Diagnosis not present

## 2023-02-24 DIAGNOSIS — E1122 Type 2 diabetes mellitus with diabetic chronic kidney disease: Secondary | ICD-10-CM | POA: Diagnosis not present

## 2023-02-24 DIAGNOSIS — B9620 Unspecified Escherichia coli [E. coli] as the cause of diseases classified elsewhere: Secondary | ICD-10-CM | POA: Diagnosis not present

## 2023-02-24 DIAGNOSIS — I130 Hypertensive heart and chronic kidney disease with heart failure and stage 1 through stage 4 chronic kidney disease, or unspecified chronic kidney disease: Secondary | ICD-10-CM | POA: Diagnosis not present

## 2023-02-24 DIAGNOSIS — K7030 Alcoholic cirrhosis of liver without ascites: Secondary | ICD-10-CM | POA: Diagnosis not present

## 2023-02-25 ENCOUNTER — Ambulatory Visit: Payer: Medicare Other | Admitting: Neurology

## 2023-02-25 ENCOUNTER — Telehealth: Payer: Self-pay | Admitting: Internal Medicine

## 2023-02-25 ENCOUNTER — Other Ambulatory Visit: Payer: Self-pay | Admitting: Family Medicine

## 2023-02-25 ENCOUNTER — Other Ambulatory Visit: Payer: Self-pay | Admitting: Internal Medicine

## 2023-02-25 DIAGNOSIS — R053 Chronic cough: Secondary | ICD-10-CM

## 2023-02-25 DIAGNOSIS — K219 Gastro-esophageal reflux disease without esophagitis: Secondary | ICD-10-CM

## 2023-02-25 DIAGNOSIS — E118 Type 2 diabetes mellitus with unspecified complications: Secondary | ICD-10-CM

## 2023-02-25 NOTE — Telephone Encounter (Signed)
Copied from CRM [id number]. Topic: Clinical - Medication Question >> Feb 25, 2023 12:27 PM Taleah C wrote: Reason for CRM: pt called and asked for a nurse to give him a call for clarification on why it was inappropriate to approve the refill request for Metformin. He stated that he is not out of the meds and that he still has 20 days left. I suggested that it maybe early for a refill which is why it was denied because he already has a 3 mo f/u scheduled. However, pt still would like to speak with the nurse. Please call and advise at [phone].

## 2023-02-25 NOTE — Telephone Encounter (Signed)
Unable to reach patient. LMTRC  

## 2023-03-03 ENCOUNTER — Other Ambulatory Visit (HOSPITAL_COMMUNITY): Payer: Self-pay

## 2023-03-04 ENCOUNTER — Other Ambulatory Visit (HOSPITAL_COMMUNITY): Payer: Self-pay

## 2023-03-04 MED ORDER — NORMAL SALINE FLUSH 0.9 % IV SOLN
INTRAVENOUS | 0 refills | Status: DC
  Filled 2023-03-04: qty 250, 25d supply, fill #0

## 2023-03-06 DIAGNOSIS — Z8719 Personal history of other diseases of the digestive system: Secondary | ICD-10-CM | POA: Diagnosis not present

## 2023-03-06 DIAGNOSIS — E119 Type 2 diabetes mellitus without complications: Secondary | ICD-10-CM | POA: Diagnosis not present

## 2023-03-06 DIAGNOSIS — K652 Spontaneous bacterial peritonitis: Secondary | ICD-10-CM | POA: Diagnosis not present

## 2023-03-06 DIAGNOSIS — I456 Pre-excitation syndrome: Secondary | ICD-10-CM | POA: Diagnosis not present

## 2023-03-06 DIAGNOSIS — D696 Thrombocytopenia, unspecified: Secondary | ICD-10-CM | POA: Diagnosis not present

## 2023-03-06 DIAGNOSIS — K7031 Alcoholic cirrhosis of liver with ascites: Secondary | ICD-10-CM | POA: Diagnosis not present

## 2023-03-06 DIAGNOSIS — Z01818 Encounter for other preprocedural examination: Secondary | ICD-10-CM | POA: Diagnosis not present

## 2023-03-06 DIAGNOSIS — I10 Essential (primary) hypertension: Secondary | ICD-10-CM | POA: Diagnosis not present

## 2023-03-06 DIAGNOSIS — I480 Paroxysmal atrial fibrillation: Secondary | ICD-10-CM | POA: Diagnosis not present

## 2023-03-06 DIAGNOSIS — K766 Portal hypertension: Secondary | ICD-10-CM | POA: Diagnosis not present

## 2023-03-06 DIAGNOSIS — G4733 Obstructive sleep apnea (adult) (pediatric): Secondary | ICD-10-CM | POA: Diagnosis not present

## 2023-03-06 DIAGNOSIS — G629 Polyneuropathy, unspecified: Secondary | ICD-10-CM | POA: Diagnosis not present

## 2023-03-07 HISTORY — PX: CHOLECYSTECTOMY, ROBOT-ASSISTED, LAPAROSCOPIC: SHX7198

## 2023-03-17 DIAGNOSIS — G4733 Obstructive sleep apnea (adult) (pediatric): Secondary | ICD-10-CM | POA: Insufficient documentation

## 2023-03-17 DIAGNOSIS — D696 Thrombocytopenia, unspecified: Secondary | ICD-10-CM | POA: Insufficient documentation

## 2023-03-17 DIAGNOSIS — Z8546 Personal history of malignant neoplasm of prostate: Secondary | ICD-10-CM | POA: Insufficient documentation

## 2023-03-17 DIAGNOSIS — K5790 Diverticulosis of intestine, part unspecified, without perforation or abscess without bleeding: Secondary | ICD-10-CM | POA: Insufficient documentation

## 2023-03-17 DIAGNOSIS — Z8719 Personal history of other diseases of the digestive system: Secondary | ICD-10-CM | POA: Insufficient documentation

## 2023-03-20 DIAGNOSIS — E1140 Type 2 diabetes mellitus with diabetic neuropathy, unspecified: Secondary | ICD-10-CM | POA: Diagnosis not present

## 2023-03-20 DIAGNOSIS — K219 Gastro-esophageal reflux disease without esophagitis: Secondary | ICD-10-CM | POA: Diagnosis not present

## 2023-03-20 DIAGNOSIS — K9171 Accidental puncture and laceration of a digestive system organ or structure during a digestive system procedure: Secondary | ICD-10-CM | POA: Diagnosis not present

## 2023-03-20 DIAGNOSIS — K5790 Diverticulosis of intestine, part unspecified, without perforation or abscess without bleeding: Secondary | ICD-10-CM | POA: Diagnosis not present

## 2023-03-20 DIAGNOSIS — K8012 Calculus of gallbladder with acute and chronic cholecystitis without obstruction: Secondary | ICD-10-CM | POA: Diagnosis not present

## 2023-03-20 DIAGNOSIS — K811 Chronic cholecystitis: Secondary | ICD-10-CM | POA: Diagnosis not present

## 2023-03-20 DIAGNOSIS — Z87891 Personal history of nicotine dependence: Secondary | ICD-10-CM | POA: Diagnosis not present

## 2023-03-20 DIAGNOSIS — Z8546 Personal history of malignant neoplasm of prostate: Secondary | ICD-10-CM | POA: Diagnosis not present

## 2023-03-20 DIAGNOSIS — Z9049 Acquired absence of other specified parts of digestive tract: Secondary | ICD-10-CM | POA: Diagnosis not present

## 2023-03-20 DIAGNOSIS — K652 Spontaneous bacterial peritonitis: Secondary | ICD-10-CM | POA: Diagnosis not present

## 2023-03-20 DIAGNOSIS — E119 Type 2 diabetes mellitus without complications: Secondary | ICD-10-CM | POA: Diagnosis not present

## 2023-03-20 DIAGNOSIS — Z7984 Long term (current) use of oral hypoglycemic drugs: Secondary | ICD-10-CM | POA: Diagnosis not present

## 2023-03-20 DIAGNOSIS — I480 Paroxysmal atrial fibrillation: Secondary | ICD-10-CM | POA: Diagnosis not present

## 2023-03-20 DIAGNOSIS — I10 Essential (primary) hypertension: Secondary | ICD-10-CM | POA: Diagnosis not present

## 2023-03-20 DIAGNOSIS — G4733 Obstructive sleep apnea (adult) (pediatric): Secondary | ICD-10-CM | POA: Diagnosis not present

## 2023-03-20 DIAGNOSIS — C9110 Chronic lymphocytic leukemia of B-cell type not having achieved remission: Secondary | ICD-10-CM | POA: Diagnosis not present

## 2023-03-20 DIAGNOSIS — K766 Portal hypertension: Secondary | ICD-10-CM | POA: Diagnosis not present

## 2023-03-20 DIAGNOSIS — K819 Cholecystitis, unspecified: Secondary | ICD-10-CM | POA: Diagnosis not present

## 2023-03-20 DIAGNOSIS — N400 Enlarged prostate without lower urinary tract symptoms: Secondary | ICD-10-CM | POA: Diagnosis not present

## 2023-03-20 DIAGNOSIS — Z79899 Other long term (current) drug therapy: Secondary | ICD-10-CM | POA: Diagnosis not present

## 2023-03-20 DIAGNOSIS — K7030 Alcoholic cirrhosis of liver without ascites: Secondary | ICD-10-CM | POA: Diagnosis not present

## 2023-03-20 DIAGNOSIS — G629 Polyneuropathy, unspecified: Secondary | ICD-10-CM | POA: Diagnosis not present

## 2023-03-20 DIAGNOSIS — D696 Thrombocytopenia, unspecified: Secondary | ICD-10-CM | POA: Diagnosis not present

## 2023-03-20 DIAGNOSIS — M1000 Idiopathic gout, unspecified site: Secondary | ICD-10-CM | POA: Diagnosis not present

## 2023-03-20 DIAGNOSIS — K7460 Unspecified cirrhosis of liver: Secondary | ICD-10-CM | POA: Diagnosis not present

## 2023-03-20 DIAGNOSIS — E1151 Type 2 diabetes mellitus with diabetic peripheral angiopathy without gangrene: Secondary | ICD-10-CM | POA: Diagnosis not present

## 2023-03-20 DIAGNOSIS — Z794 Long term (current) use of insulin: Secondary | ICD-10-CM | POA: Diagnosis not present

## 2023-03-20 DIAGNOSIS — I456 Pre-excitation syndrome: Secondary | ICD-10-CM | POA: Diagnosis not present

## 2023-03-20 DIAGNOSIS — K660 Peritoneal adhesions (postprocedural) (postinfection): Secondary | ICD-10-CM | POA: Diagnosis not present

## 2023-03-20 DIAGNOSIS — K5752 Diverticulitis of both small and large intestine without perforation or abscess without bleeding: Secondary | ICD-10-CM | POA: Diagnosis not present

## 2023-03-20 DIAGNOSIS — K7031 Alcoholic cirrhosis of liver with ascites: Secondary | ICD-10-CM | POA: Diagnosis not present

## 2023-03-21 DIAGNOSIS — I480 Paroxysmal atrial fibrillation: Secondary | ICD-10-CM | POA: Diagnosis not present

## 2023-03-21 DIAGNOSIS — K8012 Calculus of gallbladder with acute and chronic cholecystitis without obstruction: Secondary | ICD-10-CM | POA: Diagnosis not present

## 2023-03-21 DIAGNOSIS — G629 Polyneuropathy, unspecified: Secondary | ICD-10-CM | POA: Diagnosis not present

## 2023-03-21 DIAGNOSIS — C9110 Chronic lymphocytic leukemia of B-cell type not having achieved remission: Secondary | ICD-10-CM | POA: Diagnosis not present

## 2023-03-21 DIAGNOSIS — I10 Essential (primary) hypertension: Secondary | ICD-10-CM | POA: Diagnosis not present

## 2023-03-21 DIAGNOSIS — K7030 Alcoholic cirrhosis of liver without ascites: Secondary | ICD-10-CM | POA: Diagnosis not present

## 2023-03-23 NOTE — Telephone Encounter (Signed)
Forwarding to [place] to re-verify benefits

## 2023-03-24 ENCOUNTER — Ambulatory Visit: Payer: Medicare Other | Admitting: Internal Medicine

## 2023-03-27 DIAGNOSIS — K7030 Alcoholic cirrhosis of liver without ascites: Secondary | ICD-10-CM | POA: Diagnosis not present

## 2023-03-27 DIAGNOSIS — Z9049 Acquired absence of other specified parts of digestive tract: Secondary | ICD-10-CM | POA: Diagnosis not present

## 2023-03-30 ENCOUNTER — Inpatient Hospital Stay (HOSPITAL_COMMUNITY)
Admission: RE | Admit: 2023-03-30 | Discharge: 2023-03-30 | Disposition: A | Discharge status: Home / Self Care | Payer: Medicare Other | Source: Ambulatory Visit | Attending: Interventional Radiology | Admitting: Interventional Radiology

## 2023-03-31 ENCOUNTER — Other Ambulatory Visit: Payer: Self-pay | Admitting: Internal Medicine

## 2023-03-31 ENCOUNTER — Other Ambulatory Visit: Payer: Self-pay | Admitting: Family Medicine

## 2023-03-31 DIAGNOSIS — Z794 Long term (current) use of insulin: Secondary | ICD-10-CM

## 2023-03-31 DIAGNOSIS — G6289 Other specified polyneuropathies: Secondary | ICD-10-CM

## 2023-03-31 DIAGNOSIS — K219 Gastro-esophageal reflux disease without esophagitis: Secondary | ICD-10-CM

## 2023-03-31 DIAGNOSIS — R053 Chronic cough: Secondary | ICD-10-CM

## 2023-03-31 NOTE — Telephone Encounter (Signed)
 Copied from CRM [id number]. Topic: Clinical - Medication Refill >> Mar 31, 2023 11:20 AM Pascal Lux wrote: Most Recent Primary Care Visit:  Provider: Avanell Shackleton  Department: [facility]  Visit Type: HOSPITAL FU  Date: 12/22/2022  Medication: metFORMIN (GLUCOPHAGE) 500 MG tablet [[id number]] pantoprazole (PROTONIX) 40 MG tablet [[id number]] gabapentin (NEURONTIN) 300 MG capsule [[id number]]  Has the patient contacted their pharmacy? Yes (Agent: If no, request that the patient contact the pharmacy for the refill. If patient does not wish to contact the pharmacy document the reason why and proceed with request.) (Agent: If yes, when and what did the pharmacy advise?) advised to call provider  Is this the correct pharmacy for this prescription? Yes If no, delete pharmacy and type the correct one.  This is the patient's preferred pharmacy:  [id number] - [place] - [address] AT [facility] [address] [place] [address] Phone: [phone] Fax: [phone]   Has the prescription been filled recently? No  Is the patient out of the medication? Yes  Has the patient been seen for an appointment in the last year OR does the patient have an upcoming appointment? Yes  Can we respond through MyChart? Yes  Agent: Please be advised that Rx refills may take up to 3 business days. We ask that you follow-up with your pharmacy.

## 2023-03-31 NOTE — Telephone Encounter (Signed)
 Last Fill: Metformin: 12/24/22     Pantoprazole: 02/25/23 180 tabs/0 refills     Gabapentin: 09/16/22  Last OV: 12/22/22 Next OV: 04/28/23  Routing to provider for review/authorization.:

## 2023-04-01 ENCOUNTER — Other Ambulatory Visit: Payer: Self-pay | Admitting: Internal Medicine

## 2023-04-01 DIAGNOSIS — R053 Chronic cough: Secondary | ICD-10-CM

## 2023-04-01 DIAGNOSIS — G6289 Other specified polyneuropathies: Secondary | ICD-10-CM

## 2023-04-01 DIAGNOSIS — K219 Gastro-esophageal reflux disease without esophagitis: Secondary | ICD-10-CM

## 2023-04-01 MED ORDER — GABAPENTIN 300 MG PO CAPS: 300.0000 mg | ORAL_CAPSULE | Freq: Three times a day (TID) | ORAL | 3 refills | Status: DC

## 2023-04-01 MED ORDER — PANTOPRAZOLE SODIUM 40 MG PO TBEC: 40.0000 mg | DELAYED_RELEASE_TABLET | Freq: Two times a day (BID) | ORAL | 0 refills | Status: DC

## 2023-04-23 ENCOUNTER — Other Ambulatory Visit: Payer: Self-pay | Admitting: Internal Medicine

## 2023-04-23 DIAGNOSIS — E118 Type 2 diabetes mellitus with unspecified complications: Secondary | ICD-10-CM

## 2023-04-28 ENCOUNTER — Ambulatory Visit (INDEPENDENT_AMBULATORY_CARE_PROVIDER_SITE_OTHER): Payer: Medicare Other | Admitting: Internal Medicine

## 2023-04-28 ENCOUNTER — Encounter: Payer: Self-pay | Admitting: Internal Medicine

## 2023-04-28 VITALS — BP 136/84 | HR 72 | Temp 97.5°F | Resp 16 | Ht 74.0 in | Wt 227.0 lb

## 2023-04-28 DIAGNOSIS — K219 Gastro-esophageal reflux disease without esophagitis: Secondary | ICD-10-CM

## 2023-04-28 DIAGNOSIS — I10 Essential (primary) hypertension: Secondary | ICD-10-CM | POA: Diagnosis not present

## 2023-04-28 DIAGNOSIS — E119 Type 2 diabetes mellitus without complications: Secondary | ICD-10-CM

## 2023-04-28 DIAGNOSIS — K7030 Alcoholic cirrhosis of liver without ascites: Secondary | ICD-10-CM

## 2023-04-28 DIAGNOSIS — E785 Hyperlipidemia, unspecified: Secondary | ICD-10-CM | POA: Diagnosis not present

## 2023-04-28 DIAGNOSIS — K766 Portal hypertension: Secondary | ICD-10-CM | POA: Diagnosis not present

## 2023-04-28 DIAGNOSIS — R32 Unspecified urinary incontinence: Secondary | ICD-10-CM | POA: Diagnosis not present

## 2023-04-28 DIAGNOSIS — M1A09X0 Idiopathic chronic gout, multiple sites, without tophus (tophi): Secondary | ICD-10-CM

## 2023-04-28 DIAGNOSIS — G6289 Other specified polyneuropathies: Secondary | ICD-10-CM | POA: Diagnosis not present

## 2023-04-28 DIAGNOSIS — E118 Type 2 diabetes mellitus with unspecified complications: Secondary | ICD-10-CM | POA: Diagnosis not present

## 2023-04-28 DIAGNOSIS — M170 Bilateral primary osteoarthritis of knee: Secondary | ICD-10-CM | POA: Diagnosis not present

## 2023-04-28 DIAGNOSIS — Z794 Long term (current) use of insulin: Secondary | ICD-10-CM | POA: Diagnosis not present

## 2023-04-28 LAB — URINALYSIS, ROUTINE W REFLEX MICROSCOPIC
Bilirubin Urine: NEGATIVE
Ketones, ur: NEGATIVE
Leukocytes,Ua: NEGATIVE
Nitrite: NEGATIVE
RBC / HPF: NONE SEEN (ref 0–?)
Specific Gravity, Urine: 1.015 (ref 1.000–1.030)
Urine Glucose: NEGATIVE
Urobilinogen, UA: 1 (ref 0.0–1.0)
pH: 6.5 (ref 5.0–8.0)

## 2023-04-28 LAB — BASIC METABOLIC PANEL
BUN: 13 mg/dL (ref 6–23)
CO2: 30 meq/L (ref 19–32)
Calcium: 9.5 mg/dL (ref 8.4–10.5)
Chloride: 102 meq/L (ref 96–112)
Creatinine, Ser: 0.96 mg/dL (ref 0.40–1.50)
GFR: 75.7 mL/min (ref >60.00)
Glucose, Bld: 187 mg/dL — ABNORMAL HIGH (ref 70–99)
Potassium: 4.7 meq/L (ref 3.5–5.1)
Sodium: 139 meq/L (ref 135–145)

## 2023-04-28 LAB — HEPATIC FUNCTION PANEL
ALT: 8 U/L (ref 0–53)
AST: 16 U/L (ref 0–37)
Albumin: 4.2 g/dL (ref 3.5–5.2)
Alkaline Phosphatase: 72 U/L (ref 39–117)
Bilirubin, Direct: 0.3 mg/dL (ref 0.0–0.3)
Total Bilirubin: 1 mg/dL (ref 0.2–1.2)
Total Protein: 7 g/dL (ref 6.0–8.3)

## 2023-04-28 LAB — CBC WITH DIFFERENTIAL/PLATELET
Basophils Absolute: 0 10*3/uL (ref 0.0–0.1)
Basophils Relative: 0.3 % (ref 0.0–3.0)
Eosinophils Absolute: 0.1 10*3/uL (ref 0.0–0.7)
Eosinophils Relative: 1.4 % (ref 0.0–5.0)
HCT: 40.6 % (ref 39.0–52.0)
Hemoglobin: 13.8 g/dL (ref 13.0–17.0)
Lymphocytes Relative: 54 % — ABNORMAL HIGH (ref 12.0–46.0)
Lymphs Abs: 3.6 10*3/uL (ref 0.7–4.0)
MCHC: 34 g/dL (ref 30.0–36.0)
MCV: 92.2 fl (ref 78.0–100.0)
Monocytes Absolute: 0.4 10*3/uL (ref 0.1–1.0)
Monocytes Relative: 6 % (ref 3.0–12.0)
Neutro Abs: 2.6 10*3/uL (ref 1.4–7.7)
Neutrophils Relative %: 38.3 % — ABNORMAL LOW (ref 43.0–77.0)
Platelets: 108 10*3/uL — ABNORMAL LOW (ref 150.0–400.0)
RBC: 4.41 Mil/uL (ref 4.22–5.81)
RDW: 15.5 % (ref 11.5–15.5)
WBC: 6.7 10*3/uL (ref 4.0–10.5)

## 2023-04-28 LAB — LIPID PANEL
Cholesterol: 178 mg/dL (ref 0–200)
HDL: 69.8 mg/dL (ref >39.00)
LDL Cholesterol: 90 mg/dL (ref 0–99)
NonHDL: 107.91
Total CHOL/HDL Ratio: 3
Triglycerides: 90 mg/dL (ref 0.0–149.0)
VLDL: 18 mg/dL (ref 0.0–40.0)

## 2023-04-28 LAB — MICROALBUMIN / CREATININE URINE RATIO
Creatinine,U: 93.3 mg/dL
Microalb Creat Ratio: 111.7 mg/g — ABNORMAL HIGH (ref 0.0–30.0)
Microalb, Ur: 10.4 mg/dL — ABNORMAL HIGH (ref 0.0–1.9)

## 2023-04-28 LAB — HEMOGLOBIN A1C: Hgb A1c MFr Bld: 6.8 % — ABNORMAL HIGH (ref 4.6–6.5)

## 2023-04-28 LAB — URIC ACID: Uric Acid, Serum: 7.7 mg/dL (ref 4.0–7.8)

## 2023-04-28 NOTE — Progress Notes (Signed)
 Subjective:  Patient ID: Patrick Kidd, male    DOB: 09/05/1945  Age: 78 y.o. MRN: [id number]  CC: Gastroesophageal Reflux, Hypertension, and Hyperlipidemia   HPI IKEEM CLECKLER presents for f/up   -----  Discussed the use of AI scribe software for clinical note transcription with the patient, who gave verbal consent to proceed.  History of Present Illness   AAKASH Kidd "Patrick Kidd" is a 78 year old male who presents with post-operative wound issues following gallbladder removal.  He is experiencing discomfort at the site of the robotic instrument insertion, describing the wound as 'still rough'. There are no signs of infection such as redness, warmth, or discharge.  He monitors his blood sugar most mornings, with levels typically between 140 and 170 mg/dL. He does not take a statin and uses gabapentin for pain management, particularly for neuropathy, which is increasingly painful at night. He bruises and bleeds easily. No excessive thirst.  His balance is worsening, and he has poor bladder control, needing to urinate three to four times a night. No pain during urination but notes frequent urination during the day. No chest pain, shortness of breath, dizziness, lightheadedness, heartburn, indigestion, or difficulty swallowing.       Outpatient Medications Prior to Visit  Medication Sig Dispense Refill   Insulin Pen Needle (PEN NEEDLES) 32G X 5 MM MISC Inject 1 Act into the skin daily. Use to administer insulin. Dx E11.9 Dispense based on insurance preference. 100 each 1   metFORMIN (GLUCOPHAGE) 500 MG tablet TAKE 1 TABLET BY MOUTH TWICE A DAY WITH FOOD 180 tablet 1   Multiple Vitamins-Minerals (CENTRUM SILVER 50+MEN) TABS Take 1 tablet by mouth daily.     gabapentin (NEURONTIN) 300 MG capsule Take 1 capsule (300 mg total) by mouth 3 (three) times daily. 90 capsule 3   insulin glargine, 2 Unit Dial, (TOUJEO MAX SOLOSTAR) 300 UNIT/ML Solostar Pen Inject 12 Units into the skin  daily. Via SANOFI pt assistance     pantoprazole (PROTONIX) 40 MG tablet Take 1 tablet (40 mg total) by mouth 2 (two) times daily. 60 tablet 0   acetaminophen (TYLENOL) 500 MG tablet Take 1 tablet (500 mg total) by mouth every 6 (six) hours as needed. (Patient taking differently: Take 1,000 mg by mouth every 6 (six) hours as needed for moderate pain (pain score 4-6) or mild pain (pain score 1-3).) 20 tablet 0   Continuous Blood Gluc Receiver (FREESTYLE LIBRE 3 READER) DEVI 1 Act by Does not apply route daily. 1 each 3   Continuous Blood Gluc Sensor (FREESTYLE LIBRE 3 SENSOR) MISC 1 Act by Does not apply route daily. Place 1 sensor on the skin every 14 days. Use to check glucose continuously 2 each 5   Lancets (ACCU-CHEK MULTICLIX) lancets Use to check sugar daily. DX: E11.8 100 each 12   No facility-administered medications prior to visit.    ROS Review of Systems  Constitutional:  Negative for appetite change, chills, diaphoresis, fatigue and fever.  HENT: Negative.    Eyes: Negative.   Respiratory: Negative.  Negative for cough, chest tightness and shortness of breath.   Cardiovascular:  Negative for chest pain, palpitations and leg swelling.  Gastrointestinal: Negative.  Negative for abdominal pain, constipation, diarrhea, nausea and vomiting.  Genitourinary: Negative.  Negative for difficulty urinating.  Musculoskeletal: Negative.  Negative for arthralgias.  Skin: Negative.   Neurological: Negative.  Negative for dizziness.  Hematological:  Negative for adenopathy. Does not bruise/bleed easily.  Psychiatric/Behavioral: Negative.  Objective:  BP 136/84 (BP Location: Right Arm, Cuff Size: Normal)   Pulse 72   Temp (!) 97.5 F (36.4 C) (Oral)   Resp 16   Ht 6\' 2"  (1.88 m)   Wt 227 lb (103 kg)   BMI 29.15 kg/m   BP Readings from Last 3 Encounters:  04/28/23 136/84  01/16/23 (!) 149/69  12/22/22 124/70    Wt Readings from Last 3 Encounters:  04/28/23 227 lb (103 kg)   12/22/22 228 lb (103.4 kg)  12/10/22 230 lb 13.2 oz (104.7 kg)    Physical Exam Vitals reviewed.  Constitutional:      General: He is not in acute distress.    Appearance: He is not toxic-appearing or diaphoretic.  HENT:     Nose: Nose normal.     Mouth/Throat:     Mouth: Mucous membranes are moist.  Eyes:     General: No scleral icterus.    Conjunctiva/sclera: Conjunctivae normal.  Cardiovascular:     Rate and Rhythm: Normal rate and regular rhythm.     Heart sounds: No murmur heard.    No friction rub. No gallop.  Pulmonary:     Breath sounds: No stridor. No wheezing, rhonchi or rales.  Abdominal:     General: Abdomen is protuberant. Bowel sounds are increased. There is no distension.     Palpations: There is no hepatomegaly, splenomegaly or mass.     Tenderness: There is no abdominal tenderness.  Musculoskeletal:        General: Normal range of motion.     Cervical back: Neck supple.     Right lower leg: No edema.     Left lower leg: No edema.  Lymphadenopathy:     Cervical: No cervical adenopathy.  Skin:    General: Skin is warm and dry.  Neurological:     General: No focal deficit present.     Mental Status: He is alert.  Psychiatric:        Mood and Affect: Mood normal.        Behavior: Behavior normal.     Lab Results  Component Value Date   WBC 6.7 04/28/2023   HGB 13.8 04/28/2023   HCT 40.6 04/28/2023   PLT 108.0 (L) 04/28/2023   GLUCOSE 187 (H) 04/28/2023   CHOL 178 04/28/2023   TRIG 90.0 04/28/2023   HDL 69.80 04/28/2023   LDLCALC 90 04/28/2023   ALT 8 04/28/2023   AST 16 04/28/2023   NA 139 04/28/2023   K 4.7 04/28/2023   CL 102 04/28/2023   CREATININE 0.96 04/28/2023   BUN 13 04/28/2023   CO2 30 04/28/2023   TSH 1.60 04/28/2023   PSA 0.2 05/15/2021   INR 1.2 12/09/2022   HGBA1C 6.8 (H) 04/28/2023   MICROALBUR 10.4 (H) 04/28/2023    No results found.  Assessment & Plan:   Type 2 diabetes mellitus with complication, with long-term  current use of insulin (HCC)- Blood sugar is well controlled. -     Basic metabolic panel with GFR; Future -     Urinalysis, Routine w reflex microscopic; Future -     Microalbumin / creatinine urine ratio; Future -     Hemoglobin A1c; Future -     Hepatic function panel; Future -     Ambulatory referral to Ophthalmology -     HM Diabetes Foot Exam  Idiopathic chronic gout of multiple sites without tophus -     Uric acid; Future  Portal hypertension (HCC)-  PLTs are low. -     CBC with Differential/Platelet; Future -     Hepatic function panel; Future  Essential hypertension- BP is well controlled. -     CBC with Differential/Platelet; Future -     Urinalysis, Routine w reflex microscopic; Future -     TSH; Future  Alcoholic cirrhosis of liver without ascites (HCC)  Insulin-requiring or dependent type II diabetes mellitus (HCC) -     Toujeo Max SoloStar; Inject 12 Units into the skin daily. Via SANOFI pt assistance  Dispense: 12 mL; Refill: 0  Hyperlipidemia LDL goal <100 -     Lipid panel; Future -     TSH; Future  Primary osteoarthritis of both knees -     Ambulatory referral to Sports Medicine  Urinary incontinence, unspecified type -     Ambulatory referral to Urology  GERD -     Pantoprazole Sodium; Take 1 tablet (40 mg total) by mouth 2 (two) times daily.  Dispense: 180 tablet; Refill: 0  Other polyneuropathy -     Gabapentin; Take 1 capsule (300 mg total) by mouth 3 (three) times daily.  Dispense: 270 capsule; Refill: 0     Follow-up: Return in about 4 months (around 08/28/2023).  Sanda Linger, MD

## 2023-04-28 NOTE — Patient Instructions (Signed)

## 2023-04-29 ENCOUNTER — Encounter: Payer: Self-pay | Admitting: Internal Medicine

## 2023-04-29 LAB — TSH: TSH: 1.6 u[IU]/mL (ref 0.35–5.50)

## 2023-05-01 ENCOUNTER — Telehealth (HOSPITAL_BASED_OUTPATIENT_CLINIC_OR_DEPARTMENT_OTHER): Payer: Self-pay | Admitting: Physical Therapy

## 2023-05-01 MED ORDER — PANTOPRAZOLE SODIUM 40 MG PO TBEC: 40.0000 mg | DELAYED_RELEASE_TABLET | Freq: Two times a day (BID) | ORAL | 0 refills | Status: DC

## 2023-05-01 MED ORDER — GABAPENTIN 300 MG PO CAPS: 300.0000 mg | ORAL_CAPSULE | Freq: Three times a day (TID) | ORAL | 0 refills | Status: DC

## 2023-05-01 MED ORDER — TOUJEO MAX SOLOSTAR 300 UNIT/ML ~~LOC~~ SOPN: 12.0000 [IU] | PEN_INJECTOR | Freq: Every day | SUBCUTANEOUS | 0 refills | Status: DC

## 2023-05-01 MED ORDER — ATORVASTATIN CALCIUM 10 MG PO TABS: 10.0000 mg | ORAL_TABLET | Freq: Every day | ORAL | 0 refills | Status: DC

## 2023-05-01 NOTE — Telephone Encounter (Signed)
 Called and spoke to patient to remind patient of upcoming physical therapy evaluation appointment. Pt confirmed appt and will be in attendance.

## 2023-05-05 ENCOUNTER — Other Ambulatory Visit: Payer: Self-pay

## 2023-05-05 ENCOUNTER — Encounter (HOSPITAL_BASED_OUTPATIENT_CLINIC_OR_DEPARTMENT_OTHER): Payer: Self-pay | Admitting: Physical Therapy

## 2023-05-05 ENCOUNTER — Ambulatory Visit (HOSPITAL_BASED_OUTPATIENT_CLINIC_OR_DEPARTMENT_OTHER): Attending: Student | Admitting: Physical Therapy

## 2023-05-05 DIAGNOSIS — M5459 Other low back pain: Secondary | ICD-10-CM | POA: Insufficient documentation

## 2023-05-05 DIAGNOSIS — R262 Difficulty in walking, not elsewhere classified: Secondary | ICD-10-CM | POA: Diagnosis not present

## 2023-05-05 DIAGNOSIS — M79604 Pain in right leg: Secondary | ICD-10-CM | POA: Diagnosis not present

## 2023-05-05 DIAGNOSIS — M5416 Radiculopathy, lumbar region: Secondary | ICD-10-CM | POA: Insufficient documentation

## 2023-05-05 DIAGNOSIS — M6281 Muscle weakness (generalized): Secondary | ICD-10-CM | POA: Insufficient documentation

## 2023-05-05 NOTE — Therapy (Signed)
 OUTPATIENT PHYSICAL THERAPY LOWER EXTREMITY EVALUATION   Patient Name: Patrick Kidd MRN: [id number] DOB:Dec 27, 1945, 78 y.o., male Today's Date: 05/05/2023  END OF SESSION:  PT End of Session - 05/05/23 1155     Visit Number 1    Number of Visits 16    Date for PT Re-Evaluation 06/30/23    PT Start Time 1150    PT Stop Time 1228    PT Time Calculation (min) 38 min    Activity Tolerance Patient tolerated treatment well;No increased pain    Behavior During Therapy [facility] for tasks assessed/performed             Past Medical History:  Diagnosis Date   Acrophobia    Alcoholic cirrhosis (HCC) 10/04/2015   Anemia    Arthritis    foot by big toe   BPH associated with nocturia    Cataract    removed both eyes   Cholecystitis 05/2022   tx Abx   Chronic cough    PMH of   CLL (chronic lymphocytic leukemia) (HCC)    COVID-19 12/2018   Diabetes mellitus without complication (HCC)    Diverticulosis 07/03/2010   Colonoscopy.    Duodenal ulcer 2017   Fallen arches    Bilateral   GERD (gastroesophageal reflux disease)    Gout    Granuloma annulare    Hx of adenomatous colonic polyps multiple   Hydrocele 2011   Large septated right hydrocele   Liver cyst    Liver lesion    Nonspecific elevation of levels of transaminase or lactic acid dehydrogenase (LDH)    Obesity    Peripheral neuropathy    Plantar fasciitis    PMH of   Portal hypertension (HCC) 2017   Prostate cancer (HCC)    Right shoulder pain 11/2017   Sleep apnea    no cpap, patient denies   Thiamine deficiency    Wears reading eyeglasses    WPW (Wolff-Parkinson-White syndrome) 02/05/2019   Past Surgical History:  Procedure Laterality Date   BACK SURGERY  04/28/2022   CATARACT EXTRACTION, BILATERAL  12/2011   Dr Nile Riggs   COLONOSCOPY  2017   COLONOSCOPY W/ POLYPECTOMY  07/03/2010   2 adenomas, diverticulosis on right. Dr Leone Payor   CYSTOSCOPY N/A 03/15/2018   Procedure: Derinda Late;  Surgeon:  Crista Elliot, MD;  [facility];  Service: Urology;  Laterality: N/A;  NO SEEDS FOUND IN BLADDER   ESOPHAGOGASTRODUODENOSCOPY (EGD) WITH PROPOFOL N/A 01/08/2016   Procedure: ESOPHAGOGASTRODUODENOSCOPY (EGD) WITH PROPOFOL;  Surgeon: Meryl Dare, MD;  Location: [facility];  Service: Endoscopy;  Laterality: N/A;   FLEXIBLE SIGMOIDOSCOPY  2000   IR EXCHANGE BILIARY DRAIN  02/17/2023   IR PATIENT EVAL TECH 0-60 MINS  12/26/2022   IR PERC CHOLECYSTOSTOMY  12/05/2022   IR PERC CHOLECYSTOSTOMY  12/09/2022   IR RADIOLOGIST EVAL & MGMT  01/16/2023   RADIOACTIVE SEED IMPLANT N/A 03/15/2018   Procedure: RADIOACTIVE SEED IMPLANT/BRACHYTHERAPY IMPLANT;  Surgeon: Crista Elliot, MD;  Location: [facility];  Service: Urology;  Laterality: N/A;   44 SEEDS IMPLANTED   RADIOLOGY WITH ANESTHESIA N/A 12/09/2022   Procedure: IR WITH ANESTHESIA;  Surgeon: Radiologist, Medication, MD;  Location: [facility];  Service: Radiology;  Laterality: N/A;   SIGMOIDOSCOPY     SPACE OAR INSTILLATION N/A 03/15/2018   Procedure: SPACE OAR INSTILLATION;  Surgeon: Crista Elliot, MD;  Location: [facility];  Service: Urology;  Laterality: N/A;   WISDOM TOOTH EXTRACTION     Patient Active Problem List   Diagnosis Date Noted   OSA (obstructive sleep apnea) 03/17/2023   Thrombocytopenia (HCC) 03/17/2023   Urinary incontinence 06/25/2021   Degenerative lumbar spinal stenosis 04/30/2021   Degenerative arthritis of knee, bilateral 03/27/2021   Chronic lymphocytic leukemia (HCC) 11/08/2020   Insulin-requiring or dependent type II diabetes mellitus (HCC) 09/25/2020   Thiamine deficiency    Paroxysmal atrial fibrillation (HCC) 07/19/2019   Allergic rhinitis 06/09/2019   Erectile dysfunction due to diabetes mellitus (HCC) 06/09/2019   Overweight with body mass index (BMI) of 28 to 28.9 in adult 03/17/2019   WPW (Wolff-Parkinson-White syndrome) 02/05/2019    Encephalopathy, hepatic (HCC)    Degenerative disc disease, cervical 07/21/2018   Cervical radiculopathy 07/16/2018   OAB (overactive bladder) 07/06/2018   Malignant neoplasm of prostate (HCC) 01/13/2018   Hyperlipidemia LDL goal <100 11/11/2017   Essential hypertension 11/11/2017   BPH associated with nocturia 11/11/2017   Erectile dysfunction due to arterial insufficiency 04/07/2017   Peripheral vascular disease (HCC) 06/25/2016   Alcoholic cirrhosis (HCC) 10/04/2015   Type 2 diabetes mellitus with complication, with long-term current use of insulin (HCC) 08/22/2015   Hypersomnolence 03/19/2014   Peripheral neuropathy 01/11/2013   Polyclonal gammopathy 01/11/2013   GERD 10/04/2009   Benign prostatic hyperplasia without lower urinary tract symptoms 02/04/1999   Gout, unspecified 02/04/1999   Portal hypertension (HCC) 02/04/1999    PCP: Etta Grandchild, MD  REFERRING PROVIDER:  Etta Grandchild, MD/Henson, Zorita Pang, MD      REFERRING DIAG: R53.81 (ICD-10-CM) - Physical deconditioning   THERAPY DIAG:  Physical deconditioning  Rationale for Evaluation and Treatment: Rehabilitation  ONSET DATE: Pt has had a recent hospitalization and feels a decrease in endurance  SUBJECTIVE:   SUBJECTIVE STATEMENT: Eval: Pt came in complaining of LE deconditioning and balance problems. Reported a fall 6 weeks ago landing in the grass where he needed assistance getting back up. Pt at rest does not have any pain but when pain comes on he feels it in his knees. Pt has bilateral knee OA. Difficulties for the pt come when he is walking but finds relief when resting. Pt wants to gain confidence when walking, do physical conditioning, and progress back to golf.   PERTINENT HISTORY: Peripheral neuropathy,GERD, Degenerative lumbar spinal stenosis, urinary incontinence, cervical radiculopathy, essential hypertension, peripheral vascular disease, portal hypertension, right shoulder pain, plantar  fasciitis, obesity, prostate cancer, fallen arches, cataract, arthritis,  PAIN:  Are you having pain? Yes: NPRS scale: no Pain location: knees Pain description: dull Aggravating factors: walking Relieving factors: rest  PRECAUTIONS: Fall  RED FLAGS: None   WEIGHT BEARING RESTRICTIONS: No  FALLS:  Has patient fallen in last 6 months? Yes. Number of falls 1  LIVING ENVIRONMENT: One step  OCCUPATION: semi-working real Chief Technology Officer  PLOF: Independent  PATIENT GOALS: get legs stronger, more confident in balance  NEXT MD VISIT: nothing upcoming  OBJECTIVE:  Note: Objective measures were completed at Evaluation unless otherwise noted.  DIAGNOSTIC FINDINGS: Ultrasound evaluation prior to injection reveals mild joint effusion. Mild degeneration medial joint line.   PATIENT SURVEYS:  LEFS 41/80  COGNITION: Overall cognitive status: Within functional limits for tasks assessed     SENSATION: Proprioception: Impaired   EDEMA:  No edema noted  MUSCLE LENGTH:   POSTURE: rounded shoulders, forward head, increased thoracic kyphosis, and flexed trunk   PALPATION: No tenderness to palpation noted  LOWER EXTREMITY ROM:  Passive ROM Right eval Left eval  Hip flexion [facility]  Hip extension    Hip abduction    Hip adduction    Hip internal rotation    Hip external rotation    Knee flexion [facility] WFL  Knee extension [facility]  Ankle dorsiflexion    Ankle plantarflexion    Ankle inversion    Ankle eversion     (Blank rows = not tested)  LOWER EXTREMITY MMT:  MMT Right eval Left eval  Hip flexion 17.2 16.8  Hip extension    Hip abduction 21.9 22.3  Hip adduction    Hip internal rotation    Hip external rotation    Knee flexion    Knee extension 21.8 21.6  Ankle dorsiflexion    Ankle plantarflexion    Ankle inversion    Ankle eversion     (Blank rows = not tested)  LOWER EXTREMITY SPECIAL TESTS:    FUNCTIONAL TESTS:    GAIT: Noticed decreased  stride length, forward lean, increased thoracic kyphosis and relative femoral retroversion                                                                                                                                TREATMENT DATE:   4/1   There-ex: Patient education was provided on this exercise for strengthening Banded LAQ for quad strengthening 2x10 There-Act Patient education was provided on both of these exercises Banded marches for gait 2x10 RPE 3 (given higher rtb) Banded abd for gait stabilization 2x10 RPE 4    PATIENT EDUCATION:  Education details: HEP, symptom management, gym progression, self care Person educated: Patient Education method: Explanation, Demonstration, Tactile cues, Verbal cues, and Handouts Education comprehension: verbalized understanding  HOME EXERCISE PROGRAM: [id number]  ASSESSMENT:  CLINICAL IMPRESSION: Patient is a 78 y.o. male who was seen today for physical therapy evaluation and treatment for physical deconditioning. Pt presented with extensive medical history wanting to progress LE strength for confidence in gait, balance, and return to golf. Pt was taken through evaluation procedures and objective data was collected. Bilateral LE weakness was noted with HEP targeting strength. Pt was educated on exercise band progression and symptom management. Pt had surgical site noted on lower abdominal area that we provided dressing for. Pt tolerated treatment well and we will progress HEP and start balance on next visit. Pt will continue to benefit from skilled physical therapy to improve gait, balance and LE functional strength.    OBJECTIVE IMPAIRMENTS: Abnormal gait, decreased activity tolerance, decreased balance, decreased coordination, decreased endurance, decreased mobility, difficulty walking, decreased strength, and hypomobility.   ACTIVITY LIMITATIONS: carrying, lifting, bending, standing, squatting, stairs, bed mobility,  bathing, toileting, and dressing  PARTICIPATION LIMITATIONS: cleaning, laundry, shopping, community activity, occupation, and yard work  PERSONAL FACTORS: 1-2 comorbidities: polyneuropathy  are also affecting patient's functional outcome.   REHAB POTENTIAL: Good  CLINICAL DECISION MAKING: Evolving general decline in balance  and endurance   EVALUATION COMPLEXITY: Mod    GOALS: Goals reviewed with patient? Yes  SHORT TERM GOALS: Target date: 06/02/2023   Pt will be able to complete standing feet together balance exercises with minimal sway and support for balance progression. Baseline: Goal status: INITIAL  2.  Pt will increase LE strength by 15% for physical conditioning.  Baseline:  Goal status: INITIAL  3.  Therapy will perform Berg balance test next visit  Baseline:  Goal status: INITIAL   LONG TERM GOALS: Target date: 06/30/2023   Pt will be able to ambulate community distances with no increase in symptoms for functional ADL movement. Baseline:  Goal status: INITIAL  2.  Pt will increase bilateral LE strength by 30% for physical conditioning. Baseline:  Goal status: INITIAL  3.  Pt will be report no falls while reporting to PT for functional endurance.  Baseline:  Goal status: INITIAL   PLAN:  PT FREQUENCY: 1-2x/week  PT DURATION: 8 weeks  PLANNED INTERVENTIONS: 97110-Therapeutic exercises, 97530- Therapeutic activity, [id number]- Neuromuscular re-education, 97535- Self Care, [id number]- Manual therapy, [id number]- Gait training, [phone]- Aquatic Therapy, and [phone]- Electrical stimulation (unattended)  PLAN FOR NEXT SESSION: Incorporate balance training, Berg balance assessment. Progress to gym exercises, implement golf exercises.    Dessie Coma, PT 05/05/2023, 4:23 PM

## 2023-05-14 ENCOUNTER — Ambulatory Visit (HOSPITAL_BASED_OUTPATIENT_CLINIC_OR_DEPARTMENT_OTHER): Admitting: Physical Therapy

## 2023-05-14 DIAGNOSIS — M5416 Radiculopathy, lumbar region: Secondary | ICD-10-CM | POA: Diagnosis not present

## 2023-05-14 DIAGNOSIS — M5459 Other low back pain: Secondary | ICD-10-CM | POA: Diagnosis not present

## 2023-05-14 DIAGNOSIS — M6281 Muscle weakness (generalized): Secondary | ICD-10-CM

## 2023-05-14 DIAGNOSIS — M79604 Pain in right leg: Secondary | ICD-10-CM | POA: Diagnosis not present

## 2023-05-14 DIAGNOSIS — R262 Difficulty in walking, not elsewhere classified: Secondary | ICD-10-CM | POA: Diagnosis not present

## 2023-05-14 NOTE — Therapy (Incomplete)
 OUTPATIENT PHYSICAL THERAPY LOWER EXTREMITY TREATMENT   Patient Name: Patrick Kidd MRN: [id number] DOB:Oct 25, 1945, 78 y.o., male Today's Date: 05/15/2023  END OF SESSION:  PT End of Session - 05/14/23 1018     Visit Number 2    Number of Visits 16    Date for PT Re-Evaluation 06/30/23    Authorization Type MCR A&B    PT Start Time 0935    PT Stop Time 1013    PT Time Calculation (min) 38 min    Activity Tolerance Patient tolerated treatment well;No increased pain    Behavior During Therapy [facility] for tasks assessed/performed              Past Medical History:  Diagnosis Date   Acrophobia    Alcoholic cirrhosis (HCC) 10/04/2015   Anemia    Arthritis    foot by big toe   BPH associated with nocturia    Cataract    removed both eyes   Cholecystitis 05/2022   tx Abx   Chronic cough    PMH of   CLL (chronic lymphocytic leukemia) (HCC)    COVID-19 12/2018   Diabetes mellitus without complication (HCC)    Diverticulosis 07/03/2010   Colonoscopy.    Duodenal ulcer 2017   Fallen arches    Bilateral   GERD (gastroesophageal reflux disease)    Gout    Granuloma annulare    Hx of adenomatous colonic polyps multiple   Hydrocele 2011   Large septated right hydrocele   Liver cyst    Liver lesion    Nonspecific elevation of levels of transaminase or lactic acid dehydrogenase (LDH)    Obesity    Peripheral neuropathy    Plantar fasciitis    PMH of   Portal hypertension (HCC) 2017   Prostate cancer (HCC)    Right shoulder pain 11/2017   Sleep apnea    no cpap, patient denies   Thiamine deficiency    Wears reading eyeglasses    WPW (Wolff-Parkinson-White syndrome) 02/05/2019   Past Surgical History:  Procedure Laterality Date   BACK SURGERY  04/28/2022   CATARACT EXTRACTION, BILATERAL  12/2011   Dr Nile Riggs   COLONOSCOPY  2017   COLONOSCOPY W/ POLYPECTOMY  07/03/2010   2 adenomas, diverticulosis on right. Dr Leone Payor   CYSTOSCOPY N/A 03/15/2018    Procedure: Derinda Late;  Surgeon: Crista Elliot, MD;  Location: [facility];  Service: Urology;  Laterality: N/A;  NO SEEDS FOUND IN BLADDER   ESOPHAGOGASTRODUODENOSCOPY (EGD) WITH PROPOFOL N/A 01/08/2016   Procedure: ESOPHAGOGASTRODUODENOSCOPY (EGD) WITH PROPOFOL;  Surgeon: Meryl Dare, MD;  Location: [facility];  Service: Endoscopy;  Laterality: N/A;   FLEXIBLE SIGMOIDOSCOPY  2000   IR EXCHANGE BILIARY DRAIN  02/17/2023   IR PATIENT EVAL TECH 0-60 MINS  12/26/2022   IR PERC CHOLECYSTOSTOMY  12/05/2022   IR PERC CHOLECYSTOSTOMY  12/09/2022   IR RADIOLOGIST EVAL & MGMT  01/16/2023   RADIOACTIVE SEED IMPLANT N/A 03/15/2018   Procedure: RADIOACTIVE SEED IMPLANT/BRACHYTHERAPY IMPLANT;  Surgeon: Crista Elliot, MD;  Location: [facility];  Service: Urology;  Laterality: N/A;   60 SEEDS IMPLANTED   RADIOLOGY WITH ANESTHESIA N/A 12/09/2022   Procedure: IR WITH ANESTHESIA;  Surgeon: Radiologist, Medication, MD;  Location: [facility];  Service: Radiology;  Laterality: N/A;   SIGMOIDOSCOPY     SPACE OAR INSTILLATION N/A 03/15/2018   Procedure: SPACE OAR INSTILLATION;  Surgeon: Crista Elliot, MD;  Location:  [facility];  Service: Urology;  Laterality: N/A;   WISDOM TOOTH EXTRACTION     Patient Active Problem List   Diagnosis Date Noted   OSA (obstructive sleep apnea) 03/17/2023   Thrombocytopenia (HCC) 03/17/2023   Urinary incontinence 06/25/2021   Degenerative lumbar spinal stenosis 04/30/2021   Degenerative arthritis of knee, bilateral 03/27/2021   Chronic lymphocytic leukemia (HCC) 11/08/2020   Insulin-requiring or dependent type II diabetes mellitus (HCC) 09/25/2020   Thiamine deficiency    Paroxysmal atrial fibrillation (HCC) 07/19/2019   Allergic rhinitis 06/09/2019   Erectile dysfunction due to diabetes mellitus (HCC) 06/09/2019   Overweight with body mass index (BMI) of 28 to 28.9 in adult 03/17/2019   WPW  (Wolff-Parkinson-White syndrome) 02/05/2019   Encephalopathy, hepatic (HCC)    Degenerative disc disease, cervical 07/21/2018   Cervical radiculopathy 07/16/2018   OAB (overactive bladder) 07/06/2018   Malignant neoplasm of prostate (HCC) 01/13/2018   Hyperlipidemia LDL goal <100 11/11/2017   Essential hypertension 11/11/2017   BPH associated with nocturia 11/11/2017   Erectile dysfunction due to arterial insufficiency 04/07/2017   Peripheral vascular disease (HCC) 06/25/2016   Alcoholic cirrhosis (HCC) 10/04/2015   Type 2 diabetes mellitus with complication, with long-term current use of insulin (HCC) 08/22/2015   Hypersomnolence 03/19/2014   Peripheral neuropathy 01/11/2013   Polyclonal gammopathy 01/11/2013   GERD 10/04/2009   Benign prostatic hyperplasia without lower urinary tract symptoms 02/04/1999   Gout, unspecified 02/04/1999   Portal hypertension (HCC) 02/04/1999    PCP: Etta Grandchild, MD  REFERRING PROVIDER:  Etta Grandchild, MD/Henson, Zorita Pang, MD      REFERRING DIAG: R53.81 (ICD-10-CM) - Physical deconditioning   THERAPY DIAG:  Physical deconditioning  Rationale for Evaluation and Treatment: Rehabilitation  ONSET DATE: Pt has had a recent hospitalization and feels a decrease in endurance  SUBJECTIVE:   SUBJECTIVE STATEMENT: Pt states he did the stability class at [facility] yesterday.  Pt has balance issues and deconditioning.  Since last year, my balance has gotten worse.  Pt had his gallbladder taken out around the middle of March.  Pt states he was a little sore after prior Rx, but a good sore.  Pt reports compliance with HEP.  Pt has had no falls since the one in February.      PERTINENT HISTORY: Pt has a 30# lifting restriction for 6 weeks to 2 months per pt since having gallbladder surgery Peripheral neuropathy,GERD, Degenerative lumbar spinal stenosis, urinary incontinence, cervical radiculopathy, essential hypertension, peripheral vascular disease,  portal hypertension, right shoulder pain, plantar fasciitis, obesity, prostate cancer, fallen arches, cataract, arthritis,  CLL (chronic lymphocytic leukemia)  WPW (Wolff-Parkinson-White syndrome), Pt states it doesn't affect him  OA in bilat knees Lumbar decompression surgery on 04/27/2022   PAIN:  Are you having pain? Yes: NPRS scale: no pain just soreness in back and knees after the stability class Pain location: knees Pain description: dull Aggravating factors: walking Relieving factors: rest  PRECAUTIONS: Fall  RED FLAGS: None   WEIGHT BEARING RESTRICTIONS: No  FALLS:  Has patient fallen in last 6 months? Yes. Number of falls 1  LIVING ENVIRONMENT: One step  OCCUPATION: semi-working real Chief Technology Officer  PLOF: Independent  PATIENT GOALS: get legs stronger, more confident in balance  NEXT MD VISIT: nothing upcoming  OBJECTIVE:  Note: Objective measures were completed at Evaluation unless otherwise noted.  DIAGNOSTIC FINDINGS: Ultrasound evaluation prior to injection reveals mild joint effusion. Mild degeneration medial joint line.   PATIENT SURVEYS:  LEFS 41/80  COGNITION: Overall cognitive status: Within functional limits for tasks assessed     SENSATION: Proprioception: Impaired   EDEMA:  No edema noted  MUSCLE LENGTH:   POSTURE: rounded shoulders, forward head, increased thoracic kyphosis, and flexed trunk   PALPATION: No tenderness to palpation noted  LOWER EXTREMITY ROM:  Passive ROM Right eval Left eval  Hip flexion [facility]  Hip extension    Hip abduction    Hip adduction    Hip internal rotation    Hip external rotation    Knee flexion [facility] WFL  Knee extension [facility]  Ankle dorsiflexion    Ankle plantarflexion    Ankle inversion    Ankle eversion     (Blank rows = not tested)  LOWER EXTREMITY MMT:  MMT Right eval Left eval  Hip flexion 17.2 16.8  Hip extension    Hip abduction 21.9 22.3  Hip adduction    Hip internal  rotation    Hip external rotation    Knee flexion    Knee extension 21.8 21.6  Ankle dorsiflexion    Ankle plantarflexion    Ankle inversion    Ankle eversion     (Blank rows = not tested)  LOWER EXTREMITY SPECIAL TESTS:    FUNCTIONAL TESTS:    GAIT: Noticed decreased stride length, forward lean, increased thoracic kyphosis and relative femoral retroversion                                                                                                                                TREATMENT DATE:   4/10 Reviewed PMHx, current function, pain level, HEP compliance, and response to prior Rx.  Reviewed HEP  Seated LAQ with GTB 2x10 bilat Seated hip abd with GTB 2x10   Pt ambulated 2 laps (600 ft) in 3 min 40 sec.  Pt has decreased bilat foot clearance.  He has decreased stance time on R LE and increased Wb'ing thru L LE.  Pt sidestepped at rail without UE support 2 x 2 laps  Standing on airex x 1 min with FA and 3x30 sec with NBOS Standing in staggered stance x 30 sec bilat Modified tandem stance with occasional UE support  x30 sec bilat    PATIENT EDUCATION:  Education details: HEP, symptom management, exercise form, relevant anatomy, and POC Person educated: Patient Education method: Explanation, Demonstration, Tactile cues, Verbal cues, and Handouts Education comprehension: verbalized understanding  HOME EXERCISE PROGRAM: [id number]  ASSESSMENT:  CLINICAL IMPRESSION: PT reviewed HEP and performed the majority of his HEP today.  Pt reports compliance with HEP.  PT walked with pt and he ambulated 2 laps in the clinic.  It required 3 min 40 sec to complete and pt was fatigued after walking 2 laps.  He has decreased stance time on R LE and increased Wb'ing thru L LE.  PT worked on balance and proprioception and LE strengthening.  Pt responded well to  Rx reporting no pain after Rx.  He should benefit from skilled PT to address goals and improve balance, gait, strength, and  functional mobility.  OBJECTIVE IMPAIRMENTS: Abnormal gait, decreased activity tolerance, decreased balance, decreased coordination, decreased endurance, decreased mobility, difficulty walking, decreased strength, and hypomobility.   ACTIVITY LIMITATIONS: carrying, lifting, bending, standing, squatting, stairs, bed mobility, bathing, toileting, and dressing  PARTICIPATION LIMITATIONS: cleaning, laundry, shopping, community activity, occupation, and yard work  PERSONAL FACTORS: 1-2 comorbidities: polyneuropathy  are also affecting patient's functional outcome.   REHAB POTENTIAL: Good  CLINICAL DECISION MAKING: Evolving general decline in balance and endurance   EVALUATION COMPLEXITY: Mod    GOALS: Goals reviewed with patient? Yes  SHORT TERM GOALS: Target date: 06/02/2023   Pt will be able to complete standing feet together balance exercises with minimal sway and support for balance progression. Baseline: Goal status: INITIAL  2.  Pt will increase LE strength by 15% for physical conditioning.  Baseline:  Goal status: INITIAL  3.  Therapy will perform Berg balance test next visit  Baseline:  Goal status: INITIAL   LONG TERM GOALS: Target date: 06/30/2023   Pt will be able to ambulate community distances with no increase in symptoms for functional ADL movement. Baseline:  Goal status: INITIAL  2.  Pt will increase bilateral LE strength by 30% for physical conditioning. Baseline:  Goal status: INITIAL  3.  Pt will be report no falls while reporting to PT for functional endurance.  Baseline:  Goal status: INITIAL   PLAN:  PT FREQUENCY: 1-2x/week  PT DURATION: 8 weeks  PLANNED INTERVENTIONS: 97110-Therapeutic exercises, 97530- Therapeutic activity, [id number]- Neuromuscular re-education, 97535- Self Care, [id number]- Manual therapy, [id number]- Gait training, [phone]- Aquatic Therapy, and [phone]- Electrical stimulation (unattended)  PLAN FOR NEXT SESSION:  Cont with balance  training, gait, LE strengthening (with awareness of recent gallbladder surgery), functional endurance, and functional mobility.  Berg balance assessment.     Audie Clear III PT, DPT 05/15/23 10:54 AM

## 2023-05-15 ENCOUNTER — Encounter (HOSPITAL_BASED_OUTPATIENT_CLINIC_OR_DEPARTMENT_OTHER): Payer: Self-pay | Admitting: Physical Therapy

## 2023-05-20 ENCOUNTER — Encounter (HOSPITAL_BASED_OUTPATIENT_CLINIC_OR_DEPARTMENT_OTHER): Payer: Self-pay

## 2023-05-20 ENCOUNTER — Ambulatory Visit (HOSPITAL_BASED_OUTPATIENT_CLINIC_OR_DEPARTMENT_OTHER)

## 2023-05-20 DIAGNOSIS — M6281 Muscle weakness (generalized): Secondary | ICD-10-CM

## 2023-05-20 DIAGNOSIS — R262 Difficulty in walking, not elsewhere classified: Secondary | ICD-10-CM

## 2023-05-20 DIAGNOSIS — M5416 Radiculopathy, lumbar region: Secondary | ICD-10-CM | POA: Diagnosis not present

## 2023-05-20 DIAGNOSIS — M79604 Pain in right leg: Secondary | ICD-10-CM | POA: Diagnosis not present

## 2023-05-20 DIAGNOSIS — M5459 Other low back pain: Secondary | ICD-10-CM | POA: Diagnosis not present

## 2023-05-20 NOTE — Therapy (Signed)
 OUTPATIENT PHYSICAL THERAPY LOWER EXTREMITY TREATMENT   Patient Name: Patrick Kidd MRN: [id number] DOB:[birth year], 78 y.o., male Today's Date: 05/20/2023  END OF SESSION:  PT End of Session - 05/20/23 1302     Visit Number 3    Number of Visits 16    Date for PT Re-Evaluation 06/30/23    Authorization Type MCR A&B    PT Start Time 1301    PT Stop Time 1345    PT Time Calculation (min) 44 min    Activity Tolerance Patient tolerated treatment well    Behavior During Therapy WFL for tasks assessed/performed               Past Medical History:  Diagnosis Date   Acrophobia    Alcoholic cirrhosis (HCC) 10/04/2015   Anemia    Arthritis    foot by big toe   BPH associated with nocturia    Cataract    removed both eyes   Cholecystitis 05/2022   tx Abx   Chronic cough    PMH of   CLL (chronic lymphocytic leukemia) (HCC)    COVID-19 12/2018   Diabetes mellitus without complication (HCC)    Diverticulosis 07/03/2010   Colonoscopy.    Duodenal ulcer 2017   Fallen arches    Bilateral   GERD (gastroesophageal reflux disease)    Gout    Granuloma annulare    Hx of adenomatous colonic polyps multiple   Hydrocele 2011   Large septated right hydrocele   Liver cyst    Liver lesion    Nonspecific elevation of levels of transaminase or lactic acid dehydrogenase (LDH)    Obesity    Peripheral neuropathy    Plantar fasciitis    PMH of   Portal hypertension (HCC) 2017   Prostate cancer (HCC)    Right shoulder pain 11/2017   Sleep apnea    no cpap, patient denies   Thiamine deficiency    Wears reading eyeglasses    WPW (Wolff-Parkinson-White syndrome) 02/05/2019   Past Surgical History:  Procedure Laterality Date   BACK SURGERY  04/28/2022   CATARACT EXTRACTION, BILATERAL  12/2011   Dr Gennie Kicks   COLONOSCOPY  2017   COLONOSCOPY W/ POLYPECTOMY  07/03/2010   2 adenomas, diverticulosis on right. Dr Willy Harvest   CYSTOSCOPY N/A 03/15/2018   Procedure: Ardith Bedford;  Surgeon: Samson Croak, MD;  Location: [facility];  Service: Urology;  Laterality: N/A;  NO SEEDS FOUND IN BLADDER   ESOPHAGOGASTRODUODENOSCOPY (EGD) WITH PROPOFOL N/A 01/08/2016   Procedure: ESOPHAGOGASTRODUODENOSCOPY (EGD) WITH PROPOFOL;  Surgeon: Asencion Blacksmith, MD;  Location: [facility];  Service: Endoscopy;  Laterality: N/A;   FLEXIBLE SIGMOIDOSCOPY  2000   IR EXCHANGE BILIARY DRAIN  02/17/2023   IR PATIENT EVAL TECH 0-60 MINS  12/26/2022   IR PERC CHOLECYSTOSTOMY  12/05/2022   IR PERC CHOLECYSTOSTOMY  12/09/2022   IR RADIOLOGIST EVAL & MGMT  01/16/2023   RADIOACTIVE SEED IMPLANT N/A 03/15/2018   Procedure: RADIOACTIVE SEED IMPLANT/BRACHYTHERAPY IMPLANT;  Surgeon: Samson Croak, MD;  Location: [facility] [name];  Service: Urology;  Laterality: N/A;   57 SEEDS IMPLANTED   RADIOLOGY WITH ANESTHESIA N/A 12/09/2022   Procedure: IR WITH ANESTHESIA;  Surgeon: Radiologist, Medication, MD;  Location: [facility];  Service: Radiology;  Laterality: N/A;   SIGMOIDOSCOPY     SPACE OAR INSTILLATION N/A 03/15/2018   Procedure: SPACE OAR INSTILLATION;  Surgeon: Samson Croak, MD;  Location: [facility]  [place];  Service: Urology;  Laterality: N/A;   WISDOM TOOTH EXTRACTION     Patient Active Problem List   Diagnosis Date Noted   OSA (obstructive sleep apnea) 03/17/2023   Thrombocytopenia (HCC) 03/17/2023   Urinary incontinence 06/25/2021   Degenerative lumbar spinal stenosis 04/30/2021   Degenerative arthritis of knee, bilateral 03/27/2021   Chronic lymphocytic leukemia (HCC) 11/08/2020   Insulin-requiring or dependent type II diabetes mellitus (HCC) 09/25/2020   Thiamine deficiency    Paroxysmal atrial fibrillation (HCC) 07/19/2019   Allergic rhinitis 06/09/2019   Erectile dysfunction due to diabetes mellitus (HCC) 06/09/2019   Overweight with body mass index (BMI) of 28 to 28.9 in adult 03/17/2019   WPW (Wolff-Parkinson-White syndrome)  02/05/2019   Encephalopathy, hepatic (HCC)    Degenerative disc disease, cervical 07/21/2018   Cervical radiculopathy 07/16/2018   OAB (overactive bladder) 07/06/2018   Malignant neoplasm of prostate (HCC) 01/13/2018   Hyperlipidemia LDL goal <100 11/11/2017   Essential hypertension 11/11/2017   BPH associated with nocturia 11/11/2017   Erectile dysfunction due to arterial insufficiency 04/07/2017   Peripheral vascular disease (HCC) 06/25/2016   Alcoholic cirrhosis (HCC) 10/04/2015   Type 2 diabetes mellitus with complication, with long-term current use of insulin (HCC) 08/22/2015   Hypersomnolence 03/19/2014   Peripheral neuropathy 01/11/2013   Polyclonal gammopathy 01/11/2013   GERD 10/04/2009   Benign prostatic hyperplasia without lower urinary tract symptoms 02/04/1999   Gout, unspecified 02/04/1999   Portal hypertension (HCC) 02/04/1999    PCP: Etta Grandchild, MD  REFERRING PROVIDER:  Etta Grandchild, MD/Henson, Zorita Pang, MD      REFERRING DIAG: R53.81 (ICD-10-CM) - Physical deconditioning   THERAPY DIAG:  Physical deconditioning  Rationale for Evaluation and Treatment: Rehabilitation  ONSET DATE: Pt has had a recent hospitalization and feels a decrease in endurance  SUBJECTIVE:   SUBJECTIVE STATEMENT: Pt states he did the stability class at [facility] yesterday.  Pt has balance issues and deconditioning.  Since last year, my balance has gotten worse.  Pt had his gallbladder taken out around the middle of March.  Pt states he was a little sore after prior Rx, but a good sore.  Pt reports compliance with HEP.  Pt has had no falls since the one in February.      PERTINENT HISTORY: Pt has a 30# lifting restriction for 6 weeks to 2 months per pt since having gallbladder surgery Peripheral neuropathy,GERD, Degenerative lumbar spinal stenosis, urinary incontinence, cervical radiculopathy, essential hypertension, peripheral vascular disease, portal hypertension, right  shoulder pain, plantar fasciitis, obesity, prostate cancer, fallen arches, cataract, arthritis,  CLL (chronic lymphocytic leukemia)  WPW (Wolff-Parkinson-White syndrome), Pt states it doesn't affect him  OA in bilat knees Lumbar decompression surgery on 04/27/2022   PAIN:  Are you having pain? Yes: NPRS scale: no pain just soreness in back and knees after the stability class Pain location: knees Pain description: dull Aggravating factors: walking Relieving factors: rest  PRECAUTIONS: Fall  RED FLAGS: None   WEIGHT BEARING RESTRICTIONS: No  FALLS:  Has patient fallen in last 6 months? Yes. Number of falls 1  LIVING ENVIRONMENT: One step  OCCUPATION: semi-working real Chief Technology Officer  PLOF: Independent  PATIENT GOALS: get legs stronger, more confident in balance  NEXT MD VISIT: nothing upcoming  OBJECTIVE:  Note: Objective measures were completed at Evaluation unless otherwise noted.  DIAGNOSTIC FINDINGS: Ultrasound evaluation prior to injection reveals mild joint effusion. Mild degeneration medial joint line.   PATIENT SURVEYS:  LEFS  41/80  COGNITION: Overall cognitive status: Within functional limits for tasks assessed     SENSATION: Proprioception: Impaired   EDEMA:  No edema noted  MUSCLE LENGTH:   POSTURE: rounded shoulders, forward head, increased thoracic kyphosis, and flexed trunk   PALPATION: No tenderness to palpation noted  LOWER EXTREMITY ROM:  Passive ROM Right eval Left eval  Hip flexion [facility]  Hip extension    Hip abduction    Hip adduction    Hip internal rotation    Hip external rotation    Knee flexion [facility] WFL  Knee extension [facility]  Ankle dorsiflexion    Ankle plantarflexion    Ankle inversion    Ankle eversion     (Blank rows = not tested)  LOWER EXTREMITY MMT:  MMT Right eval Left eval  Hip flexion 17.2 16.8  Hip extension    Hip abduction 21.9 22.3  Hip adduction    Hip internal rotation    Hip external  rotation    Knee flexion    Knee extension 21.8 21.6  Ankle dorsiflexion    Ankle plantarflexion    Ankle inversion    Ankle eversion     (Blank rows = not tested)  LOWER EXTREMITY SPECIAL TESTS:    FUNCTIONAL TESTS:    GAIT: Noticed decreased stride length, forward lean, increased thoracic kyphosis and relative femoral retroversion                                                                                                                                TREATMENT DATE:    4/16 Nu-step L5 x24min  HR/TR 2x15 FA with EC 3x30sec Slow march without UE support 2x10  Tandem walking along rail x1 lap Walking marches along rail x2 laps Gait with head turns/nods hall x1lap ea Lateral stepping in hall 1/2 hall x2 Retro walking  (SBA) 1/2 hall x2 Step ups onto airex x10ea fwd and lateral  STS x10 from elevated plinth, 2x10 after lowering plinth Step ups 6" 2x5ea Eccentric step downs from 2" step with min UE support x10ea   4/10 Reviewed PMHx, current function, pain level, HEP compliance, and response to prior Rx.  Reviewed HEP  Seated LAQ with GTB 2x10 bilat Seated hip abd with GTB 2x10   Pt ambulated 2 laps (600 ft) in 3 min 40 sec.  Pt has decreased bilat foot clearance.  He has decreased stance time on R LE and increased Wb'ing thru L LE.  Pt sidestepped at rail without UE support 2 x 2 laps  Standing on airex x 1 min with FA and 3x30 sec with NBOS Standing in staggered stance x 30 sec bilat Modified tandem stance with occasional UE support  x30 sec bilat    PATIENT EDUCATION:  Education details: HEP, symptom management, exercise form, relevant anatomy, and POC Person educated: Patient Education method: Explanation, Demonstration, Tactile cues, Verbal cues, and Handouts Education comprehension: verbalized understanding  HOME EXERCISE PROGRAM: [id number]  ASSESSMENT:  CLINICAL IMPRESSION: Good tolerance for progressions to stability and strengthening  program. Required cues for upright posture with sit to stands. Lowered plinth for added challenge with good tolerance. Difficulty observed with head turns/nods due to apprehension. Pt also demonstrated apprehension with eccentric fwd step downs. Pt unable to maintain romberg stance with EC, but had better tolerance for wider stance Will continue to build [facility] and confidence with dynamic balance.   OBJECTIVE IMPAIRMENTS: Abnormal gait, decreased activity tolerance, decreased balance, decreased coordination, decreased endurance, decreased mobility, difficulty walking, decreased strength, and hypomobility.   ACTIVITY LIMITATIONS: carrying, lifting, bending, standing, squatting, stairs, bed mobility, bathing, toileting, and dressing  PARTICIPATION LIMITATIONS: cleaning, laundry, shopping, community activity, occupation, and yard work  PERSONAL FACTORS: 1-2 comorbidities: polyneuropathy  are also affecting patient's functional outcome.   REHAB POTENTIAL: Good  CLINICAL DECISION MAKING: Evolving general decline in balance and endurance   EVALUATION COMPLEXITY: Mod    GOALS: Goals reviewed with patient? Yes  SHORT TERM GOALS: Target date: 06/02/2023   Pt will be able to complete standing feet together balance exercises with minimal sway and support for balance progression. Baseline: Goal status: INITIAL  2.  Pt will increase LE strength by 15% for physical conditioning.  Baseline:  Goal status: INITIAL  3.  Therapy will perform Berg balance test next visit  Baseline:  Goal status: INITIAL   LONG TERM GOALS: Target date: 06/30/2023   Pt will be able to ambulate community distances with no increase in symptoms for functional ADL movement. Baseline:  Goal status: INITIAL  2.  Pt will increase bilateral LE strength by 30% for physical conditioning. Baseline:  Goal status: INITIAL  3.  Pt will be report no falls while reporting to PT for functional endurance.  Baseline:  Goal  status: INITIAL   PLAN:  PT FREQUENCY: 1-2x/week  PT DURATION: 8 weeks  PLANNED INTERVENTIONS: 97110-Therapeutic exercises, 97530- Therapeutic activity, [id number]- Neuromuscular re-education, 97535- Self Care, [id number]- Manual therapy, [id number]- Gait training, [phone]- Aquatic Therapy, and [phone]- Electrical stimulation (unattended)  PLAN FOR NEXT SESSION:  Cont with balance training, gait, LE strengthening (with awareness of recent gallbladder surgery), functional endurance, and functional mobility.  Berg balance assessment.     Herb Loges, PTA  05/20/23 2:48 PM

## 2023-05-26 ENCOUNTER — Ambulatory Visit (HOSPITAL_BASED_OUTPATIENT_CLINIC_OR_DEPARTMENT_OTHER): Admitting: Physical Therapy

## 2023-05-26 ENCOUNTER — Encounter (HOSPITAL_BASED_OUTPATIENT_CLINIC_OR_DEPARTMENT_OTHER): Payer: Self-pay | Admitting: Physical Therapy

## 2023-05-26 DIAGNOSIS — R262 Difficulty in walking, not elsewhere classified: Secondary | ICD-10-CM

## 2023-05-26 DIAGNOSIS — M79604 Pain in right leg: Secondary | ICD-10-CM | POA: Diagnosis not present

## 2023-05-26 DIAGNOSIS — M6281 Muscle weakness (generalized): Secondary | ICD-10-CM | POA: Diagnosis not present

## 2023-05-26 DIAGNOSIS — M5459 Other low back pain: Secondary | ICD-10-CM

## 2023-05-26 DIAGNOSIS — M5416 Radiculopathy, lumbar region: Secondary | ICD-10-CM

## 2023-05-26 NOTE — Therapy (Signed)
 OUTPATIENT PHYSICAL THERAPY LOWER EXTREMITY TREATMENT   Patient Name: Patrick Kidd MRN: [id number] DOB:January 10, 1946, 78 y.o., male Today's Date: 05/26/2023  END OF SESSION:  PT End of Session - 05/26/23 1107     Visit Number 4    Number of Visits 16    Date for PT Re-Evaluation 06/30/23    Authorization Type MCR A&B    PT Start Time 1103    PT Stop Time 1143    PT Time Calculation (min) 40 min    Activity Tolerance Patient tolerated treatment well;No increased pain    Behavior During Therapy [facility] for tasks assessed/performed                Past Medical History:  Diagnosis Date   Acrophobia    Alcoholic cirrhosis (HCC) 10/04/2015   Anemia    Arthritis    foot by big toe   BPH associated with nocturia    Cataract    removed both eyes   Cholecystitis 05/2022   tx Abx   Chronic cough    PMH of   CLL (chronic lymphocytic leukemia) (HCC)    COVID-19 12/2018   Diabetes mellitus without complication (HCC)    Diverticulosis 07/03/2010   Colonoscopy.    Duodenal ulcer 2017   Fallen arches    Bilateral   GERD (gastroesophageal reflux disease)    Gout    Granuloma annulare    Hx of adenomatous colonic polyps multiple   Hydrocele 2011   Large septated right hydrocele   Liver cyst    Liver lesion    Nonspecific elevation of levels of transaminase or lactic acid dehydrogenase (LDH)    Obesity    Peripheral neuropathy    Plantar fasciitis    PMH of   Portal hypertension (HCC) 2017   Prostate cancer (HCC)    Right shoulder pain 11/2017   Sleep apnea    no cpap, patient denies   Thiamine  deficiency    Wears reading eyeglasses    WPW (Wolff-Parkinson-White syndrome) 02/05/2019   Past Surgical History:  Procedure Laterality Date   BACK SURGERY  04/28/2022   CATARACT EXTRACTION, BILATERAL  12/2011   Dr Gennie Kicks   COLONOSCOPY  2017   COLONOSCOPY W/ POLYPECTOMY  07/03/2010   2 adenomas, diverticulosis on right. Dr Willy Harvest   CYSTOSCOPY N/A 03/15/2018    Procedure: Ardith Bedford;  Surgeon: Samson Croak, MD;  Location: [facility];  Service: Urology;  Laterality: N/A;  NO SEEDS FOUND IN BLADDER   ESOPHAGOGASTRODUODENOSCOPY (EGD) WITH PROPOFOL  N/A 01/08/2016   Procedure: ESOPHAGOGASTRODUODENOSCOPY (EGD) WITH PROPOFOL ;  Surgeon: Asencion Blacksmith, MD;  Location: [facility];  Service: Endoscopy;  Laterality: N/A;   FLEXIBLE SIGMOIDOSCOPY  2000   IR EXCHANGE BILIARY DRAIN  02/17/2023   IR PATIENT EVAL TECH 0-60 MINS  12/26/2022   IR PERC CHOLECYSTOSTOMY  12/05/2022   IR PERC CHOLECYSTOSTOMY  12/09/2022   IR RADIOLOGIST EVAL & MGMT  01/16/2023   RADIOACTIVE SEED IMPLANT N/A 03/15/2018   Procedure: RADIOACTIVE SEED IMPLANT/BRACHYTHERAPY IMPLANT;  Surgeon: Samson Croak, MD;  Location: [facility];  Service: Urology;  Laterality: N/A;   71 SEEDS IMPLANTED   RADIOLOGY WITH ANESTHESIA N/A 12/09/2022   Procedure: IR WITH ANESTHESIA;  Surgeon: Radiologist, Medication, MD;  Location: [facility];  Service: Radiology;  Laterality: N/A;   SIGMOIDOSCOPY     SPACE OAR INSTILLATION N/A 03/15/2018   Procedure: SPACE OAR INSTILLATION;  Surgeon: Samson Croak, MD;  Location: [facility];  Service: Urology;  Laterality: N/A;   WISDOM TOOTH EXTRACTION     Patient Active Problem List   Diagnosis Date Noted   OSA (obstructive sleep apnea) 03/17/2023   Thrombocytopenia (HCC) 03/17/2023   Urinary incontinence 06/25/2021   Degenerative lumbar spinal stenosis 04/30/2021   Degenerative arthritis of knee, bilateral 03/27/2021   Chronic lymphocytic leukemia (HCC) 11/08/2020   Insulin -requiring or dependent type II diabetes mellitus (HCC) 09/25/2020   Thiamine  deficiency    Paroxysmal atrial fibrillation (HCC) 07/19/2019   Allergic rhinitis 06/09/2019   Erectile dysfunction due to diabetes mellitus (HCC) 06/09/2019   Overweight with body mass index (BMI) of 28 to 28.9 in adult 03/17/2019   WPW  (Wolff-Parkinson-White syndrome) 02/05/2019   Encephalopathy, hepatic (HCC)    Degenerative disc disease, cervical 07/21/2018   Cervical radiculopathy 07/16/2018   OAB (overactive bladder) 07/06/2018   Malignant neoplasm of prostate (HCC) 01/13/2018   Hyperlipidemia LDL goal <100 11/11/2017   Essential hypertension 11/11/2017   BPH associated with nocturia 11/11/2017   Erectile dysfunction due to arterial insufficiency 04/07/2017   Peripheral vascular disease (HCC) 06/25/2016   Alcoholic cirrhosis (HCC) 10/04/2015   Type 2 diabetes mellitus with complication, with long-term current use of insulin  (HCC) 08/22/2015   Hypersomnolence 03/19/2014   Peripheral neuropathy 01/11/2013   Polyclonal gammopathy 01/11/2013   GERD 10/04/2009   Benign prostatic hyperplasia without lower urinary tract symptoms 02/04/1999   Gout, unspecified 02/04/1999   Portal hypertension (HCC) 02/04/1999    PCP: [name], MD  REFERRING PROVIDER:  [name], MD/Henson, Vickie L, MD      REFERRING DIAG: R53.81 (ICD-10-CM) - Physical deconditioning   THERAPY DIAG:  Physical deconditioning  Rationale for Evaluation and Treatment: Rehabilitation  ONSET DATE: Pt has had a recent hospitalization and feels a decrease in endurance  SUBJECTIVE:   SUBJECTIVE STATEMENT: 4/22 Pt says there is no pain today. Just soreness that he feels in the morning and at night but he has gotten used to it. Says he wants to work on his balance today.    PERTINENT HISTORY: Pt has a 30# lifting restriction for 6 weeks to 2 months per pt since having gallbladder surgery Peripheral neuropathy,GERD, Degenerative lumbar spinal stenosis, urinary incontinence, cervical radiculopathy, essential hypertension, peripheral vascular disease, portal hypertension, right shoulder pain, plantar fasciitis, obesity, prostate cancer, fallen arches, cataract, arthritis,  CLL (chronic lymphocytic leukemia)  WPW (Wolff-Parkinson-White  syndrome), Pt states it doesn't affect him  OA in bilat knees Lumbar decompression surgery on 04/27/2022   PAIN:  Are you having pain? Yes: NPRS scale: no pain just soreness in back and knees after the stability class Pain location: knees Pain description: dull Aggravating factors: walking Relieving factors: rest  PRECAUTIONS: Fall  RED FLAGS: None   WEIGHT BEARING RESTRICTIONS: No  FALLS:  Has patient fallen in last 6 months? Yes. Number of falls 1  LIVING ENVIRONMENT: One step  OCCUPATION: semi-working real Chief Technology Officer  PLOF: Independent  PATIENT GOALS: get legs stronger, more confident in balance  NEXT MD VISIT: nothing upcoming  OBJECTIVE:  Note: Objective measures were completed at Evaluation unless otherwise noted.  DIAGNOSTIC FINDINGS: Ultrasound evaluation prior to injection reveals mild joint effusion. Mild degeneration medial joint line.   PATIENT SURVEYS:  LEFS 41/80  COGNITION: Overall cognitive status: Within functional limits for tasks assessed     SENSATION: Proprioception: Impaired   EDEMA:  No edema noted  MUSCLE LENGTH:   POSTURE: rounded shoulders, forward  head, increased thoracic kyphosis, and flexed trunk   PALPATION: No tenderness to palpation noted  LOWER EXTREMITY ROM:  Passive ROM Right eval Left eval  Hip flexion [facility]  Hip extension    Hip abduction    Hip adduction    Hip internal rotation    Hip external rotation    Knee flexion [facility] WFL  Knee extension [facility]  Ankle dorsiflexion    Ankle plantarflexion    Ankle inversion    Ankle eversion     (Blank rows = not tested)  LOWER EXTREMITY MMT:  MMT Right eval Left eval  Hip flexion 17.2 16.8  Hip extension    Hip abduction 21.9 22.3  Hip adduction    Hip internal rotation    Hip external rotation    Knee flexion    Knee extension 21.8 21.6  Ankle dorsiflexion    Ankle plantarflexion    Ankle inversion    Ankle eversion     (Blank rows = not  tested)  LOWER EXTREMITY SPECIAL TESTS:    FUNCTIONAL TESTS:    GAIT: Noticed decreased stride length, forward lean, increased thoracic kyphosis and relative femoral retroversion                                                                                                                                TREATMENT DATE:   4/22  There-ex: Nu-step warm up lvl 3 Seated marching red RTB 3x10 Neuro-Re-ed  Step on and off air-ex 2x10  4-way box Cone taps 2 taps on each cone 2 sets  Eccentric step downs 2in 2x8  Walking marches with high hip flex hold 2 sets     4/16 Nu-step L5 x61min  HR/TR 2x15 FA with EC 3x30sec Slow march without UE support 2x10  Tandem walking along rail x1 lap Walking marches along rail x2 laps Gait with head turns/nods hall x1lap ea Lateral stepping in hall 1/2 hall x2 Retro walking  (SBA) 1/2 hall x2 Step ups onto airex x10ea fwd and lateral  STS x10 from elevated plinth, 2x10 after lowering plinth Step ups 6" 2x5ea Eccentric step downs from 2" step with min UE support x10ea   4/10 Reviewed PMHx, current function, pain level, HEP compliance, and response to prior Rx.  Reviewed HEP  Seated LAQ with GTB 2x10 bilat Seated hip abd with GTB 2x10   Pt ambulated 2 laps (600 ft) in 3 min 40 sec.  Pt has decreased bilat foot clearance.  He has decreased stance time on R LE and increased Wb'ing thru L LE.  Pt sidestepped at rail without UE support 2 x 2 laps  Standing on airex x 1 min with FA and 3x30 sec with NBOS Standing in staggered stance x 30 sec bilat Modified tandem stance with occasional UE support  x30 sec bilat    PATIENT EDUCATION:  Education details: HEP, symptom management, exercise form, relevant  anatomy, and POC Person educated: Patient Education method: Explanation, Demonstration, Tactile cues, Verbal cues, and Handouts Education comprehension: verbalized understanding  HOME EXERCISE  PROGRAM: [id number]  ASSESSMENT:  CLINICAL IMPRESSION: 4/22 Pt warmed up on the nu-step for with no increase in symptoms. Main focus of today's session was balance. Neuro re-ed exercises were done with cueing on body positioning and muscle activation through balance and gait. Education was provided on weight precautions and eventual progressions to gym equipment. Pt wants to progress to feeling comfortable navigating stairs and community ambulation. Next visit should continue to focus on balance and LE strengthening. Pt will continue to benefit from skilled physical therapy to progress balance tolerance for community ambulation.   OBJECTIVE IMPAIRMENTS: Abnormal gait, decreased activity tolerance, decreased balance, decreased coordination, decreased endurance, decreased mobility, difficulty walking, decreased strength, and hypomobility.   ACTIVITY LIMITATIONS: carrying, lifting, bending, standing, squatting, stairs, bed mobility, bathing, toileting, and dressing  PARTICIPATION LIMITATIONS: cleaning, laundry, shopping, community activity, occupation, and yard work  PERSONAL FACTORS: 1-2 comorbidities: polyneuropathy  are also affecting patient's functional outcome.   REHAB POTENTIAL: Good  CLINICAL DECISION MAKING: Evolving general decline in balance and endurance   EVALUATION COMPLEXITY: Mod    GOALS: Goals reviewed with patient? Yes  SHORT TERM GOALS: Target date: 06/02/2023   Pt will be able to complete standing feet together balance exercises with minimal sway and support for balance progression. Baseline: Goal status: INITIAL  2.  Pt will increase LE strength by 15% for physical conditioning.  Baseline:  Goal status: INITIAL  3.  Therapy will perform Berg balance test next visit  Baseline:  Goal status: INITIAL   LONG TERM GOALS: Target date: 06/30/2023   Pt will be able to ambulate community distances with no increase in symptoms for functional ADL  movement. Baseline:  Goal status: INITIAL  2.  Pt will increase bilateral LE strength by 30% for physical conditioning. Baseline:  Goal status: INITIAL  3.  Pt will be report no falls while reporting to PT for functional endurance.  Baseline:  Goal status: INITIAL   PLAN:  PT FREQUENCY: 1-2x/week  PT DURATION: 8 weeks  PLANNED INTERVENTIONS: 97110-Therapeutic exercises, 97530- Therapeutic activity, [id number]- Neuromuscular re-education, 97535- Self Care, [id number]- Manual therapy, [id number]- Gait training, [id number]- Aquatic Therapy, and [phone]- Electrical stimulation (unattended)  PLAN FOR NEXT SESSION:  Cont with balance training, gait, LE strengthening (with awareness of recent gallbladder surgery), functional endurance, and functional mobility.  Berg balance assessment.     Katheran Palms SPT  05/26/23 11:44 AM  I have reviewed and concur with this student's documentation.   Kitty Perkins, PT 05/26/2023 1:40 PM   During this treatment session, the therapist was present, participating in and directing the treatment.

## 2023-05-28 ENCOUNTER — Ambulatory Visit (HOSPITAL_BASED_OUTPATIENT_CLINIC_OR_DEPARTMENT_OTHER): Admitting: Physical Therapy

## 2023-05-28 ENCOUNTER — Encounter (HOSPITAL_BASED_OUTPATIENT_CLINIC_OR_DEPARTMENT_OTHER): Payer: Self-pay | Admitting: Physical Therapy

## 2023-05-28 DIAGNOSIS — R262 Difficulty in walking, not elsewhere classified: Secondary | ICD-10-CM | POA: Diagnosis not present

## 2023-05-28 DIAGNOSIS — M79604 Pain in right leg: Secondary | ICD-10-CM

## 2023-05-28 DIAGNOSIS — M6281 Muscle weakness (generalized): Secondary | ICD-10-CM | POA: Diagnosis not present

## 2023-05-28 DIAGNOSIS — M5416 Radiculopathy, lumbar region: Secondary | ICD-10-CM

## 2023-05-28 DIAGNOSIS — M5459 Other low back pain: Secondary | ICD-10-CM

## 2023-05-28 NOTE — Therapy (Unsigned)
 OUTPATIENT PHYSICAL THERAPY LOWER EXTREMITY TREATMENT   Patient Name: Patrick Kidd MRN: [id number] DOB:03-25-1945, 78 y.o., male Today's Date: 05/28/2023  END OF SESSION:  PT End of Session - 05/28/23 1018     Visit Number 5    Number of Visits 16    Date for PT Re-Evaluation 06/30/23    Authorization Type MCR A&B    PT Start Time 1015    PT Stop Time 1058    PT Time Calculation (min) 43 min    Activity Tolerance Patient tolerated treatment well;No increased pain    Behavior During Therapy [facility] for tasks assessed/performed                 Past Medical History:  Diagnosis Date   Acrophobia    Alcoholic cirrhosis (HCC) 10/04/2015   Anemia    Arthritis    foot by big toe   BPH associated with nocturia    Cataract    removed both eyes   Cholecystitis 05/2022   tx Abx   Chronic cough    PMH of   CLL (chronic lymphocytic leukemia) (HCC)    COVID-19 12/2018   Diabetes mellitus without complication (HCC)    Diverticulosis 07/03/2010   Colonoscopy.    Duodenal ulcer 2017   Fallen arches    Bilateral   GERD (gastroesophageal reflux disease)    Gout    Granuloma annulare    Hx of adenomatous colonic polyps multiple   Hydrocele 2011   Large septated right hydrocele   Liver cyst    Liver lesion    Nonspecific elevation of levels of transaminase or lactic acid dehydrogenase (LDH)    Obesity    Peripheral neuropathy    Plantar fasciitis    PMH of   Portal hypertension (HCC) 2017   Prostate cancer (HCC)    Right shoulder pain 11/2017   Sleep apnea    no cpap, patient denies   Thiamine  deficiency    Wears reading eyeglasses    WPW (Wolff-Parkinson-White syndrome) 02/05/2019   Past Surgical History:  Procedure Laterality Date   BACK SURGERY  04/28/2022   CATARACT EXTRACTION, BILATERAL  12/2011   Dr Gennie Kicks   COLONOSCOPY  2017   COLONOSCOPY W/ POLYPECTOMY  07/03/2010   2 adenomas, diverticulosis on right. Dr Willy Harvest   CYSTOSCOPY N/A 03/15/2018    Procedure: Ardith Bedford;  Surgeon: Samson Croak, MD;  Location: [facility];  Service: Urology;  Laterality: N/A;  NO SEEDS FOUND IN BLADDER   ESOPHAGOGASTRODUODENOSCOPY (EGD) WITH PROPOFOL  N/A 01/08/2016   Procedure: ESOPHAGOGASTRODUODENOSCOPY (EGD) WITH PROPOFOL ;  Surgeon: Asencion Blacksmith, MD;  Location: [facility];  Service: Endoscopy;  Laterality: N/A;   FLEXIBLE SIGMOIDOSCOPY  2000   IR EXCHANGE BILIARY DRAIN  02/17/2023   IR PATIENT EVAL TECH 0-60 MINS  12/26/2022   IR PERC CHOLECYSTOSTOMY  12/05/2022   IR PERC CHOLECYSTOSTOMY  12/09/2022   IR RADIOLOGIST EVAL & MGMT  01/16/2023   RADIOACTIVE SEED IMPLANT N/A 03/15/2018   Procedure: RADIOACTIVE SEED IMPLANT/BRACHYTHERAPY IMPLANT;  Surgeon: Samson Croak, MD;  Location: [facility] [place];  Service: Urology;  Laterality: N/A;   25 SEEDS IMPLANTED   RADIOLOGY WITH ANESTHESIA N/A 12/09/2022   Procedure: IR WITH ANESTHESIA;  Surgeon: Radiologist, Medication, MD;  Location: [facility];  Service: Radiology;  Laterality: N/A;   SIGMOIDOSCOPY     SPACE OAR INSTILLATION N/A 03/15/2018   Procedure: SPACE OAR INSTILLATION;  Surgeon: Maralyn Sender III,  MD;  Location: [facility];  Service: Urology;  Laterality: N/A;   WISDOM TOOTH EXTRACTION     Patient Active Problem List   Diagnosis Date Noted   OSA (obstructive sleep apnea) 03/17/2023   Thrombocytopenia (HCC) 03/17/2023   Urinary incontinence 06/25/2021   Degenerative lumbar spinal stenosis 04/30/2021   Degenerative arthritis of knee, bilateral 03/27/2021   Chronic lymphocytic leukemia (HCC) 11/08/2020   Insulin -requiring or dependent type II diabetes mellitus (HCC) 09/25/2020   Thiamine  deficiency    Paroxysmal atrial fibrillation (HCC) 07/19/2019   Allergic rhinitis 06/09/2019   Erectile dysfunction due to diabetes mellitus (HCC) 06/09/2019   Overweight with body mass index (BMI) of 28 to 28.9 in adult 03/17/2019   WPW  (Wolff-Parkinson-White syndrome) 02/05/2019   Encephalopathy, hepatic (HCC)    Degenerative disc disease, cervical 07/21/2018   Cervical radiculopathy 07/16/2018   OAB (overactive bladder) 07/06/2018   Malignant neoplasm of prostate (HCC) 01/13/2018   Hyperlipidemia LDL goal <100 11/11/2017   Essential hypertension 11/11/2017   BPH associated with nocturia 11/11/2017   Erectile dysfunction due to arterial insufficiency 04/07/2017   Peripheral vascular disease (HCC) 06/25/2016   Alcoholic cirrhosis (HCC) 10/04/2015   Type 2 diabetes mellitus with complication, with long-term current use of insulin  (HCC) 08/22/2015   Hypersomnolence 03/19/2014   Peripheral neuropathy 01/11/2013   Polyclonal gammopathy 01/11/2013   GERD 10/04/2009   Benign prostatic hyperplasia without lower urinary tract symptoms 02/04/1999   Gout, unspecified 02/04/1999   Portal hypertension (HCC) 02/04/1999    PCP: [name], MD  REFERRING PROVIDER:  [name], MD/Henson, Marcos Sevin, MD      REFERRING DIAG: R53.81 (ICD-10-CM) - Physical deconditioning   THERAPY DIAG:  Physical deconditioning  Rationale for Evaluation and Treatment: Rehabilitation  ONSET DATE: Pt has had a recent hospitalization and feels a decrease in endurance  SUBJECTIVE:   SUBJECTIVE STATEMENT: 4/24 Pt says he did a strengthening class yesterday and he had to catch himself during it. Other than that, he states he just feels sore today. No pain.    PERTINENT HISTORY: Pt has a 30# lifting restriction for 6 weeks to 2 months per pt since having gallbladder surgery Peripheral neuropathy,GERD, Degenerative lumbar spinal stenosis, urinary incontinence, cervical radiculopathy, essential hypertension, peripheral vascular disease, portal hypertension, right shoulder pain, plantar fasciitis, obesity, prostate cancer, fallen arches, cataract, arthritis,  CLL (chronic lymphocytic leukemia)  WPW (Wolff-Parkinson-White syndrome), Pt  states it doesn't affect him  OA in bilat knees Lumbar decompression surgery on 04/27/2022   PAIN:  Are you having pain? Yes: NPRS scale: no pain just soreness in back and knees after the stability class Pain location: knees Pain description: dull Aggravating factors: walking Relieving factors: rest  PRECAUTIONS: Fall  RED FLAGS: None   WEIGHT BEARING RESTRICTIONS: No  FALLS:  Has patient fallen in last 6 months? Yes. Number of falls 1  LIVING ENVIRONMENT: One step  OCCUPATION: semi-working real Chief Technology Officer  PLOF: Independent  PATIENT GOALS: get legs stronger, more confident in balance  NEXT MD VISIT: nothing upcoming  OBJECTIVE:  Note: Objective measures were completed at Evaluation unless otherwise noted.  DIAGNOSTIC FINDINGS: Ultrasound evaluation prior to injection reveals mild joint effusion. Mild degeneration medial joint line.   PATIENT SURVEYS:  LEFS 41/80  COGNITION: Overall cognitive status: Within functional limits for tasks assessed     SENSATION: Proprioception: Impaired   EDEMA:  No edema noted  MUSCLE LENGTH:   POSTURE: rounded shoulders, forward head, increased thoracic kyphosis,  and flexed trunk   PALPATION: No tenderness to palpation noted  LOWER EXTREMITY ROM:  Passive ROM Right eval Left eval  Hip flexion [facility]  Hip extension    Hip abduction    Hip adduction    Hip internal rotation    Hip external rotation    Knee flexion [facility] WFL  Knee extension [facility]  Ankle dorsiflexion    Ankle plantarflexion    Ankle inversion    Ankle eversion     (Blank rows = not tested)  LOWER EXTREMITY MMT:  MMT Right eval Left eval  Hip flexion 17.2 16.8  Hip extension    Hip abduction 21.9 22.3  Hip adduction    Hip internal rotation    Hip external rotation    Knee flexion    Knee extension 21.8 21.6  Ankle dorsiflexion    Ankle plantarflexion    Ankle inversion    Ankle eversion     (Blank rows = not tested)  LOWER  EXTREMITY SPECIAL TESTS:    FUNCTIONAL TESTS:    GAIT: Noticed decreased stride length, forward lean, increased thoracic kyphosis and relative femoral retroversion                                                                                                                                TREATMENT DATE:   4/24  There-ex: Nu-step warm up lvl 3  Neuro-Re-ed  Air ex feet together 3x30sec  Lateral stepping on airex 2 laps 3x  4 way cone taps 3x  Hurdles tap then step over 3x  4 way hurdles 3x   4/22  There-ex: Nu-step warm up lvl 3 Seated marching red RTB 3x10 Neuro-Re-ed  Step on and off air-ex 2x10  4-way box Cone taps 2 taps on each cone 2 sets  Eccentric step downs 2in 2x8  Walking marches with high hip flex hold 2 sets     4/16 Nu-step L5 x71min  HR/TR 2x15 FA with EC 3x30sec Slow march without UE support 2x10  Tandem walking along rail x1 lap Walking marches along rail x2 laps Gait with head turns/nods hall x1lap ea Lateral stepping in hall 1/2 hall x2 Retro walking  (SBA) 1/2 hall x2 Step ups onto airex x10ea fwd and lateral  STS x10 from elevated plinth, 2x10 after lowering plinth Step ups 6" 2x5ea Eccentric step downs from 2" step with min UE support x10ea   4/10 Reviewed PMHx, current function, pain level, HEP compliance, and response to prior Rx.  Reviewed HEP  Seated LAQ with GTB 2x10 bilat Seated hip abd with GTB 2x10   Pt ambulated 2 laps (600 ft) in 3 min 40 sec.  Pt has decreased bilat foot clearance.  He has decreased stance time on R LE and increased Wb'ing thru L LE.  Pt sidestepped at rail without UE support 2 x 2 laps  Standing on  airex x 1 min with FA and 3x30 sec with NBOS Standing in staggered stance x 30 sec bilat Modified tandem stance with occasional UE support  x30 sec bilat    PATIENT EDUCATION:  Education details: HEP, symptom management, exercise form, relevant anatomy,  and POC Person educated: Patient Education method: Explanation, Demonstration, Tactile cues, Verbal cues, and Handouts Education comprehension: verbalized understanding  HOME EXERCISE PROGRAM: [id number]  ASSESSMENT:  CLINICAL IMPRESSION: 4/24 Pt warmed up on the nu-step for with no increase in symptoms. Pt had increased soreness from strengthening class yesterday but balance exercises were still performed. Balance exercises were tolerated well. Pt noted that he really enjoys the cone taps; can implement variations next visit. Air ex feet together can be progressed to eyes closed for next visit. Next visit should continue to focus on balance and LE strengthening. Pt will continue to benefit from skilled physical therapy to progress balance tolerance for community ambulation.     OBJECTIVE IMPAIRMENTS: Abnormal gait, decreased activity tolerance, decreased balance, decreased coordination, decreased endurance, decreased mobility, difficulty walking, decreased strength, and hypomobility.   ACTIVITY LIMITATIONS: carrying, lifting, bending, standing, squatting, stairs, bed mobility, bathing, toileting, and dressing  PARTICIPATION LIMITATIONS: cleaning, laundry, shopping, community activity, occupation, and yard work  PERSONAL FACTORS: 1-2 comorbidities: polyneuropathy  are also affecting patient's functional outcome.   REHAB POTENTIAL: Good  CLINICAL DECISION MAKING: Evolving general decline in balance and endurance   EVALUATION COMPLEXITY: Mod    GOALS: Goals reviewed with patient? Yes  SHORT TERM GOALS: Target date: 06/02/2023   Pt will be able to complete standing feet together balance exercises with minimal sway and support for balance progression. Baseline: Goal status: INITIAL  2.  Pt will increase LE strength by 15% for physical conditioning.  Baseline:  Goal status: INITIAL  3.  Therapy will perform Berg balance test next visit  Baseline:  Goal status:  INITIAL   LONG TERM GOALS: Target date: 06/30/2023   Pt will be able to ambulate community distances with no increase in symptoms for functional ADL movement. Baseline:  Goal status: INITIAL  2.  Pt will increase bilateral LE strength by 30% for physical conditioning. Baseline:  Goal status: INITIAL  3.  Pt will be report no falls while reporting to PT for functional endurance.  Baseline:  Goal status: INITIAL   PLAN:  PT FREQUENCY: 1-2x/week  PT DURATION: 8 weeks  PLANNED INTERVENTIONS: 97110-Therapeutic exercises, 97530- Therapeutic activity, [id number]- Neuromuscular re-education, 97535- Self Care, [id number]- Manual therapy, [id number]- Gait training, [phone]- Aquatic Therapy, and [phone]- Electrical stimulation (unattended)  PLAN FOR NEXT SESSION:  Cont with balance training, gait, LE strengthening (with awareness of recent gallbladder surgery), functional endurance, and functional mobility.  Berg balance assessment.     Herminia Lope Eryanna Regal SPT  05/28/23 1:03 PM  I have reviewed and concur with this student's documentation.    During this treatment session, the therapist was present, participating in and directing the treatment.  I have reviewed and concur with this student's documentation.   Kitty Perkins, PT 05/29/2023 8:20 AM

## 2023-05-29 ENCOUNTER — Encounter (HOSPITAL_BASED_OUTPATIENT_CLINIC_OR_DEPARTMENT_OTHER): Payer: Self-pay | Admitting: Physical Therapy

## 2023-06-02 ENCOUNTER — Ambulatory Visit (HOSPITAL_BASED_OUTPATIENT_CLINIC_OR_DEPARTMENT_OTHER): Admitting: Physical Therapy

## 2023-06-02 ENCOUNTER — Encounter (HOSPITAL_BASED_OUTPATIENT_CLINIC_OR_DEPARTMENT_OTHER): Payer: Self-pay | Admitting: Physical Therapy

## 2023-06-02 DIAGNOSIS — M79604 Pain in right leg: Secondary | ICD-10-CM | POA: Diagnosis not present

## 2023-06-02 DIAGNOSIS — M5459 Other low back pain: Secondary | ICD-10-CM

## 2023-06-02 DIAGNOSIS — M6281 Muscle weakness (generalized): Secondary | ICD-10-CM | POA: Diagnosis not present

## 2023-06-02 DIAGNOSIS — R262 Difficulty in walking, not elsewhere classified: Secondary | ICD-10-CM | POA: Diagnosis not present

## 2023-06-02 DIAGNOSIS — M5416 Radiculopathy, lumbar region: Secondary | ICD-10-CM

## 2023-06-02 NOTE — Therapy (Unsigned)
 OUTPATIENT PHYSICAL THERAPY LOWER EXTREMITY TREATMENT   Patient Name: Patrick Kidd MRN: [id number] DOB:05-29-1945, 78 y.o., male Today's Date: 06/02/2023  END OF SESSION:  PT End of Session - 06/02/23 1351     Visit Number 6    Number of Visits 16    Date for PT Re-Evaluation 06/30/23    Authorization Type MCR A&B    PT Start Time 1100    PT Stop Time 1144    PT Time Calculation (min) 44 min    Activity Tolerance Patient tolerated treatment well;No increased pain    Behavior During Therapy [facility] for tasks assessed/performed                  Past Medical History:  Diagnosis Date   Acrophobia    Alcoholic cirrhosis (HCC) 10/04/2015   Anemia    Arthritis    foot by big toe   BPH associated with nocturia    Cataract    removed both eyes   Cholecystitis 05/2022   tx Abx   Chronic cough    PMH of   CLL (chronic lymphocytic leukemia) (HCC)    COVID-19 12/2018   Diabetes mellitus without complication (HCC)    Diverticulosis 07/03/2010   Colonoscopy.    Duodenal ulcer 2017   Fallen arches    Bilateral   GERD (gastroesophageal reflux disease)    Gout    Granuloma annulare    Hx of adenomatous colonic polyps multiple   Hydrocele 2011   Large septated right hydrocele   Liver cyst    Liver lesion    Nonspecific elevation of levels of transaminase or lactic acid dehydrogenase (LDH)    Obesity    Peripheral neuropathy    Plantar fasciitis    PMH of   Portal hypertension (HCC) 2017   Prostate cancer (HCC)    Right shoulder pain 11/2017   Sleep apnea    no cpap, patient denies   Thiamine  deficiency    Wears reading eyeglasses    WPW (Wolff-Parkinson-White syndrome) 02/05/2019   Past Surgical History:  Procedure Laterality Date   BACK SURGERY  04/28/2022   CATARACT EXTRACTION, BILATERAL  12/2011   Dr Gennie Kicks   COLONOSCOPY  2017   COLONOSCOPY W/ POLYPECTOMY  07/03/2010   2 adenomas, diverticulosis on right. Dr Willy Harvest   CYSTOSCOPY N/A 03/15/2018    Procedure: Ardith Bedford;  Surgeon: Samson Croak, MD;  Location: [facility];  Service: Urology;  Laterality: N/A;  NO SEEDS FOUND IN BLADDER   ESOPHAGOGASTRODUODENOSCOPY (EGD) WITH PROPOFOL  N/A 01/08/2016   Procedure: ESOPHAGOGASTRODUODENOSCOPY (EGD) WITH PROPOFOL ;  Surgeon: Asencion Blacksmith, MD;  Location: [facility];  Service: Endoscopy;  Laterality: N/A;   FLEXIBLE SIGMOIDOSCOPY  2000   IR EXCHANGE BILIARY DRAIN  02/17/2023   IR PATIENT EVAL TECH 0-60 MINS  12/26/2022   IR PERC CHOLECYSTOSTOMY  12/05/2022   IR PERC CHOLECYSTOSTOMY  12/09/2022   IR RADIOLOGIST EVAL & MGMT  01/16/2023   RADIOACTIVE SEED IMPLANT N/A 03/15/2018   Procedure: RADIOACTIVE SEED IMPLANT/BRACHYTHERAPY IMPLANT;  Surgeon: Samson Croak, MD;  Location: [facility];  Service: Urology;  Laterality: N/A;   54 SEEDS IMPLANTED   RADIOLOGY WITH ANESTHESIA N/A 12/09/2022   Procedure: IR WITH ANESTHESIA;  Surgeon: Radiologist, Medication, MD;  Location: [facility];  Service: Radiology;  Laterality: N/A;   SIGMOIDOSCOPY     SPACE OAR INSTILLATION N/A 03/15/2018   Procedure: SPACE OAR INSTILLATION;  Surgeon: Maralyn Sender  III, MD;  Location: [facility];  Service: Urology;  Laterality: N/A;   WISDOM TOOTH EXTRACTION     Patient Active Problem List   Diagnosis Date Noted   OSA (obstructive sleep apnea) 03/17/2023   Thrombocytopenia (HCC) 03/17/2023   Urinary incontinence 06/25/2021   Degenerative lumbar spinal stenosis 04/30/2021   Degenerative arthritis of knee, bilateral 03/27/2021   Chronic lymphocytic leukemia (HCC) 11/08/2020   Insulin -requiring or dependent type II diabetes mellitus (HCC) 09/25/2020   Thiamine  deficiency    Paroxysmal atrial fibrillation (HCC) 07/19/2019   Allergic rhinitis 06/09/2019   Erectile dysfunction due to diabetes mellitus (HCC) 06/09/2019   Overweight with body mass index (BMI) of 28 to 28.9 in adult 03/17/2019   WPW  (Wolff-Parkinson-White syndrome) 02/05/2019   Encephalopathy, hepatic (HCC)    Degenerative disc disease, cervical 07/21/2018   Cervical radiculopathy 07/16/2018   OAB (overactive bladder) 07/06/2018   Malignant neoplasm of prostate (HCC) 01/13/2018   Hyperlipidemia LDL goal <100 11/11/2017   Essential hypertension 11/11/2017   BPH associated with nocturia 11/11/2017   Erectile dysfunction due to arterial insufficiency 04/07/2017   Peripheral vascular disease (HCC) 06/25/2016   Alcoholic cirrhosis (HCC) 10/04/2015   Type 2 diabetes mellitus with complication, with long-term current use of insulin  (HCC) 08/22/2015   Hypersomnolence 03/19/2014   Peripheral neuropathy 01/11/2013   Polyclonal gammopathy 01/11/2013   GERD 10/04/2009   Benign prostatic hyperplasia without lower urinary tract symptoms 02/04/1999   Gout, unspecified 02/04/1999   Portal hypertension (HCC) 02/04/1999    PCP: [name], MD  REFERRING PROVIDER:  [name], MD/Henson, Vickie L, MD      REFERRING DIAG: R53.81 (ICD-10-CM) - Physical deconditioning   THERAPY DIAG:  Physical deconditioning  Rationale for Evaluation and Treatment: Rehabilitation  ONSET DATE: Pt has had a recent hospitalization and feels a decrease in endurance  SUBJECTIVE:   SUBJECTIVE STATEMENT: 4/24 The patient just feels general soreness at this time. He reports he was at a baseball game and felt unsafe walking down a big hill .    PERTINENT HISTORY: Pt has a 30# lifting restriction for 6 weeks to 2 months per pt since having gallbladder surgery Peripheral neuropathy,GERD, Degenerative lumbar spinal stenosis, urinary incontinence, cervical radiculopathy, essential hypertension, peripheral vascular disease, portal hypertension, right shoulder pain, plantar fasciitis, obesity, prostate cancer, fallen arches, cataract, arthritis,  CLL (chronic lymphocytic leukemia)  WPW (Wolff-Parkinson-White syndrome), Pt states it doesn't  affect him  OA in bilat knees Lumbar decompression surgery on 04/27/2022   PAIN:  Are you having pain? Yes: NPRS scale: no pain just soreness in back and knees after the stability class Pain location: knees Pain description: dull Aggravating factors: walking Relieving factors: rest  PRECAUTIONS: Fall  RED FLAGS: None   WEIGHT BEARING RESTRICTIONS: No  FALLS:  Has patient fallen in last 6 months? Yes. Number of falls 1  LIVING ENVIRONMENT: One step  OCCUPATION: semi-working real Chief Technology Officer  PLOF: Independent  PATIENT GOALS: get legs stronger, more confident in balance  NEXT MD VISIT: nothing upcoming  OBJECTIVE:  Note: Objective measures were completed at Evaluation unless otherwise noted.  DIAGNOSTIC FINDINGS: Ultrasound evaluation prior to injection reveals mild joint effusion. Mild degeneration medial joint line.   PATIENT SURVEYS:  LEFS 41/80  COGNITION: Overall cognitive status: Within functional limits for tasks assessed     SENSATION: Proprioception: Impaired   EDEMA:  No edema noted  MUSCLE LENGTH:   POSTURE: rounded shoulders, forward head, increased thoracic kyphosis, and  flexed trunk   PALPATION: No tenderness to palpation noted  LOWER EXTREMITY ROM:  Passive ROM Right eval Left eval  Hip flexion [facility]  Hip extension    Hip abduction    Hip adduction    Hip internal rotation    Hip external rotation    Knee flexion [facility] WFL  Knee extension [facility]  Ankle dorsiflexion    Ankle plantarflexion    Ankle inversion    Ankle eversion     (Blank rows = not tested)  LOWER EXTREMITY MMT:  MMT Right eval Left eval  Hip flexion 17.2 16.8  Hip extension    Hip abduction 21.9 22.3  Hip adduction    Hip internal rotation    Hip external rotation    Knee flexion    Knee extension 21.8 21.6  Ankle dorsiflexion    Ankle plantarflexion    Ankle inversion    Ankle eversion     (Blank rows = not tested)  LOWER EXTREMITY SPECIAL  TESTS:    FUNCTIONAL TESTS:    GAIT: Noticed decreased stride length, forward lean, increased thoracic kyphosis and relative femoral retroversion                                                                                                                                TREATMENT DATE:   4/29  There-ex:  Nu-step warm up lvl 3  Row 3x10 LF 30 lbs 1x12 2x12 35 lbs  Ankle DF 3x10 50 lbs RPE of 4     Nuero-re-ed: Air-ex: heel/toe rock 2x15 with min guarding  Air-ex march 2x15 with min guarding   Huddle lateral step 2 laps 5 hurdles  Hurdle forward/ backwards step 2 laps   There-act:  Sit to stand x10 and x7  Mild fatigue noted on second set     4/24  There-ex: Nu-step warm up lvl 3  Neuro-Re-ed  Air ex feet together 3x30sec  Lateral stepping on airex 2 laps 3x  4 way cone taps 3x  Hurdles tap then step over 3x  4 way hurdles 3x   4/22  There-ex: Nu-step warm up lvl 3 Seated marching red RTB 3x10 Neuro-Re-ed  Step on and off air-ex 2x10  4-way box Cone taps 2 taps on each cone 2 sets  Eccentric step downs 2in 2x8  Walking marches with high hip flex hold 2 sets     4/16 Nu-step L5 x58min  HR/TR 2x15 FA with EC 3x30sec Slow march without UE support 2x10  Tandem walking along rail x1 lap Walking marches along rail x2 laps Gait with head turns/nods hall x1lap ea Lateral stepping in hall 1/2 hall x2 Retro walking  (SBA) 1/2 hall x2 Step ups onto airex x10ea fwd and lateral  STS x10 from elevated plinth, 2x10 after lowering plinth Step ups 6" 2x5ea Eccentric step downs from 2" step with min UE  support x10ea   4/10 Reviewed PMHx, current function, pain level, HEP compliance, and response to prior Rx.  Reviewed HEP  Seated LAQ with GTB 2x10 bilat Seated hip abd with GTB 2x10   Pt ambulated 2 laps (600 ft) in 3 min 40 sec.  Pt has decreased bilat foot clearance.  He has decreased stance time on R  LE and increased Wb'ing thru L LE.  Pt sidestepped at rail without UE support 2 x 2 laps  Standing on airex x 1 min with FA and 3x30 sec with NBOS Standing in staggered stance x 30 sec bilat Modified tandem stance with occasional UE support  x30 sec bilat    PATIENT EDUCATION:  Education details: HEP, symptom management, exercise form, relevant anatomy, and POC Person educated: Patient Education method: Explanation, Demonstration, Tactile cues, Verbal cues, and Handouts Education comprehension: verbalized understanding  HOME EXERCISE PROGRAM: [id number]  ASSESSMENT:  CLINICAL IMPRESSION: The patient tolerated treatment well. We worked on posterior balance with the air-ex. We also worked on sit to stand tranfers from a low surface. He felt fatigued. We reviewed gym exercises with him. We reviewed how to grade his exercises using RPE. His RPE was measured at 4 on the calf raises and a 4-5 on the row.    OBJECTIVE IMPAIRMENTS: Abnormal gait, decreased activity tolerance, decreased balance, decreased coordination, decreased endurance, decreased mobility, difficulty walking, decreased strength, and hypomobility.   ACTIVITY LIMITATIONS: carrying, lifting, bending, standing, squatting, stairs, bed mobility, bathing, toileting, and dressing  PARTICIPATION LIMITATIONS: cleaning, laundry, shopping, community activity, occupation, and yard work  PERSONAL FACTORS: 1-2 comorbidities: polyneuropathy  are also affecting patient's functional outcome.   REHAB POTENTIAL: Good  CLINICAL DECISION MAKING: Evolving general decline in balance and endurance   EVALUATION COMPLEXITY: Mod    GOALS: Goals reviewed with patient? Yes  SHORT TERM GOALS: Target date: 06/02/2023   Pt will be able to complete standing feet together balance exercises with minimal sway and support for balance progression. Baseline: Goal status: INITIAL  2.  Pt will increase LE strength by 15% for physical conditioning.   Baseline:  Goal status: INITIAL  3.  Therapy will perform Berg balance test next visit  Baseline:  Goal status: INITIAL   LONG TERM GOALS: Target date: 06/30/2023   Pt will be able to ambulate community distances with no increase in symptoms for functional ADL movement. Baseline:  Goal status: INITIAL  2.  Pt will increase bilateral LE strength by 30% for physical conditioning. Baseline:  Goal status: INITIAL  3.  Pt will be report no falls while reporting to PT for functional endurance.  Baseline:  Goal status: INITIAL   PLAN:  PT FREQUENCY: 1-2x/week  PT DURATION: 8 weeks  PLANNED INTERVENTIONS: 97110-Therapeutic exercises, 97530- Therapeutic activity, [id number]- Neuromuscular re-education, 97535- Self Care, [id number]- Manual therapy, [id number]- Gait training, [phone]- Aquatic Therapy, and [phone]- Electrical stimulation (unattended)  PLAN FOR NEXT SESSION:  Cont with balance training, gait, LE strengthening (with awareness of recent gallbladder surgery), functional endurance, and functional mobility.  Berg balance assessment.      Kitty Perkins, PT 06/02/2023 2:06 PM

## 2023-06-03 ENCOUNTER — Encounter (HOSPITAL_BASED_OUTPATIENT_CLINIC_OR_DEPARTMENT_OTHER): Payer: Self-pay | Admitting: Physical Therapy

## 2023-06-04 ENCOUNTER — Encounter (HOSPITAL_BASED_OUTPATIENT_CLINIC_OR_DEPARTMENT_OTHER): Payer: Self-pay

## 2023-06-04 ENCOUNTER — Ambulatory Visit (HOSPITAL_BASED_OUTPATIENT_CLINIC_OR_DEPARTMENT_OTHER): Attending: Student

## 2023-06-04 DIAGNOSIS — M79604 Pain in right leg: Secondary | ICD-10-CM

## 2023-06-04 DIAGNOSIS — M6281 Muscle weakness (generalized): Secondary | ICD-10-CM | POA: Diagnosis not present

## 2023-06-04 DIAGNOSIS — M5459 Other low back pain: Secondary | ICD-10-CM | POA: Diagnosis not present

## 2023-06-04 DIAGNOSIS — R262 Difficulty in walking, not elsewhere classified: Secondary | ICD-10-CM | POA: Insufficient documentation

## 2023-06-04 DIAGNOSIS — M5416 Radiculopathy, lumbar region: Secondary | ICD-10-CM

## 2023-06-04 NOTE — Therapy (Signed)
 OUTPATIENT PHYSICAL THERAPY LOWER EXTREMITY TREATMENT   Patient Name: Patrick Kidd MRN: [id number] DOB:12-30-1945, 78 y.o., male Today's Date: 06/04/2023  END OF SESSION:  PT End of Session - 06/04/23 1230     Visit Number 7    Number of Visits 16    Date for PT Re-Evaluation 06/30/23    Authorization Type MCR A&B    PT Start Time 1018    PT Stop Time 1100    PT Time Calculation (min) 42 min    Activity Tolerance Patient tolerated treatment well    Behavior During Therapy WFL for tasks assessed/performed                   Past Medical History:  Diagnosis Date   Acrophobia    Alcoholic cirrhosis (HCC) 10/04/2015   Anemia    Arthritis    foot by big toe   BPH associated with nocturia    Cataract    removed both eyes   Cholecystitis 05/2022   tx Abx   Chronic cough    PMH of   CLL (chronic lymphocytic leukemia) (HCC)    COVID-19 12/2018   Diabetes mellitus without complication (HCC)    Diverticulosis 07/03/2010   Colonoscopy.    Duodenal ulcer 2017   Fallen arches    Bilateral   GERD (gastroesophageal reflux disease)    Gout    Granuloma annulare    Hx of adenomatous colonic polyps multiple   Hydrocele 2011   Large septated right hydrocele   Liver cyst    Liver lesion    Nonspecific elevation of levels of transaminase or lactic acid dehydrogenase (LDH)    Obesity    Peripheral neuropathy    Plantar fasciitis    PMH of   Portal hypertension (HCC) 2017   Prostate cancer (HCC)    Right shoulder pain 11/2017   Sleep apnea    no cpap, patient denies   Thiamine  deficiency    Wears reading eyeglasses    WPW (Wolff-Parkinson-White syndrome) 02/05/2019   Past Surgical History:  Procedure Laterality Date   BACK SURGERY  04/28/2022   CATARACT EXTRACTION, BILATERAL  12/2011   Dr Gennie Kicks   COLONOSCOPY  2017   COLONOSCOPY W/ POLYPECTOMY  07/03/2010   2 adenomas, diverticulosis on right. Dr Willy Harvest   CYSTOSCOPY N/A 03/15/2018   Procedure:  Ardith Bedford;  Surgeon: Samson Croak, MD;  Location: [facility];  Service: Urology;  Laterality: N/A;  NO SEEDS FOUND IN BLADDER   ESOPHAGOGASTRODUODENOSCOPY (EGD) WITH PROPOFOL  N/A 01/08/2016   Procedure: ESOPHAGOGASTRODUODENOSCOPY (EGD) WITH PROPOFOL ;  Surgeon: Asencion Blacksmith, MD;  Location: [facility];  Service: Endoscopy;  Laterality: N/A;   FLEXIBLE SIGMOIDOSCOPY  2000   IR EXCHANGE BILIARY DRAIN  02/17/2023   IR PATIENT EVAL TECH 0-60 MINS  12/26/2022   IR PERC CHOLECYSTOSTOMY  12/05/2022   IR PERC CHOLECYSTOSTOMY  12/09/2022   IR RADIOLOGIST EVAL & MGMT  01/16/2023   RADIOACTIVE SEED IMPLANT N/A 03/15/2018   Procedure: RADIOACTIVE SEED IMPLANT/BRACHYTHERAPY IMPLANT;  Surgeon: Samson Croak, MD;  Location: [facility];  Service: Urology;  Laterality: N/A;   59 SEEDS IMPLANTED   RADIOLOGY WITH ANESTHESIA N/A 12/09/2022   Procedure: IR WITH ANESTHESIA;  Surgeon: Radiologist, Medication, MD;  Location: [facility];  Service: Radiology;  Laterality: N/A;   SIGMOIDOSCOPY     SPACE OAR INSTILLATION N/A 03/15/2018   Procedure: SPACE OAR INSTILLATION;  Surgeon: Maralyn Sender III,  MD;  Location: [facility];  Service: Urology;  Laterality: N/A;   WISDOM TOOTH EXTRACTION     Patient Active Problem List   Diagnosis Date Noted   OSA (obstructive sleep apnea) 03/17/2023   Thrombocytopenia (HCC) 03/17/2023   Urinary incontinence 06/25/2021   Degenerative lumbar spinal stenosis 04/30/2021   Degenerative arthritis of knee, bilateral 03/27/2021   Chronic lymphocytic leukemia (HCC) 11/08/2020   Insulin -requiring or dependent type II diabetes mellitus (HCC) 09/25/2020   Thiamine  deficiency    Paroxysmal atrial fibrillation (HCC) 07/19/2019   Allergic rhinitis 06/09/2019   Erectile dysfunction due to diabetes mellitus (HCC) 06/09/2019   Overweight with body mass index (BMI) of 28 to 28.9 in adult 03/17/2019   WPW (Wolff-Parkinson-White  syndrome) 02/05/2019   Encephalopathy, hepatic (HCC)    Degenerative disc disease, cervical 07/21/2018   Cervical radiculopathy 07/16/2018   OAB (overactive bladder) 07/06/2018   Malignant neoplasm of prostate (HCC) 01/13/2018   Hyperlipidemia LDL goal <100 11/11/2017   Essential hypertension 11/11/2017   BPH associated with nocturia 11/11/2017   Erectile dysfunction due to arterial insufficiency 04/07/2017   Peripheral vascular disease (HCC) 06/25/2016   Alcoholic cirrhosis (HCC) 10/04/2015   Type 2 diabetes mellitus with complication, with long-term current use of insulin  (HCC) 08/22/2015   Hypersomnolence 03/19/2014   Peripheral neuropathy 01/11/2013   Polyclonal gammopathy 01/11/2013   GERD 10/04/2009   Benign prostatic hyperplasia without lower urinary tract symptoms 02/04/1999   Gout, unspecified 02/04/1999   Portal hypertension (HCC) 02/04/1999    PCP: [name], MD  REFERRING PROVIDER:  [name], MD/Henson, Vickie L, MD      REFERRING DIAG: R53.81 (ICD-10-CM) - Physical deconditioning   THERAPY DIAG:  Physical deconditioning  Rationale for Evaluation and Treatment: Rehabilitation  ONSET DATE: Pt has had a recent hospitalization and feels a decrease in endurance  SUBJECTIVE:   SUBJECTIVE STATEMENT: Pt reports his R knee has been giving him problems. "I wonder if I need to see ortho?" Continues to have difficulty with balance.    PERTINENT HISTORY: Pt has a 30# lifting restriction for 6 weeks to 2 months per pt since having gallbladder surgery Peripheral neuropathy,GERD, Degenerative lumbar spinal stenosis, urinary incontinence, cervical radiculopathy, essential hypertension, peripheral vascular disease, portal hypertension, right shoulder pain, plantar fasciitis, obesity, prostate cancer, fallen arches, cataract, arthritis,  CLL (chronic lymphocytic leukemia)  WPW (Wolff-Parkinson-White syndrome), Pt states it doesn't affect him  OA in bilat  knees Lumbar decompression surgery on 04/27/2022   PAIN:  Are you having pain? Yes: NPRS scale: no pain just soreness in back and knees after the stability class Pain location: knees Pain description: dull Aggravating factors: walking Relieving factors: rest  PRECAUTIONS: Fall  RED FLAGS: None   WEIGHT BEARING RESTRICTIONS: No  FALLS:  Has patient fallen in last 6 months? Yes. Number of falls 1  LIVING ENVIRONMENT: One step  OCCUPATION: semi-working real Chief Technology [employer]  PLOF: Independent  PATIENT GOALS: get legs stronger, more confident in balance  NEXT MD VISIT: nothing upcoming  OBJECTIVE:  Note: Objective measures were completed at Evaluation unless otherwise noted.  DIAGNOSTIC FINDINGS: Ultrasound evaluation prior to injection reveals mild joint effusion. Mild degeneration medial joint line.   PATIENT SURVEYS:  LEFS 41/80  COGNITION: Overall cognitive status: Within functional limits for tasks assessed     SENSATION: Proprioception: Impaired   EDEMA:  No edema noted  MUSCLE LENGTH:   POSTURE: rounded shoulders, forward head, increased thoracic kyphosis, and flexed trunk  PALPATION: No tenderness to palpation noted  LOWER EXTREMITY ROM:  Passive ROM Right eval Left eval  Hip flexion [facility]  Hip extension    Hip abduction    Hip adduction    Hip internal rotation    Hip external rotation    Knee flexion [facility] WFL  Knee extension [facility]  Ankle dorsiflexion    Ankle plantarflexion    Ankle inversion    Ankle eversion     (Blank rows = not tested)  LOWER EXTREMITY MMT:  MMT Right eval Left eval  Hip flexion 17.2 16.8  Hip extension    Hip abduction 21.9 22.3  Hip adduction    Hip internal rotation    Hip external rotation    Knee flexion    Knee extension 21.8 21.6  Ankle dorsiflexion    Ankle plantarflexion    Ankle inversion    Ankle eversion     (Blank rows = not tested)  LOWER EXTREMITY SPECIAL TESTS:    FUNCTIONAL  TESTS:    GAIT: Noticed decreased stride length, forward lean, increased thoracic kyphosis and relative femoral retroversion                                                                                                                                TREATMENT DATE:     5/1  There-ex:  Scifit bike L3 x33min  Leg Press 110lbs 2x15 Knee extension machine 25# 2x15ea (cued to place more weight through R LE) Slide lunges x5 (stopped with RLE due to inability.) x10L    Nuero-re-ed: Air-ex: heel/toe rock 2x15 with light CGA Walking marching along rail x2laps Gait with head turns/nods in hall x 1lap each (light CGA) Sit to stand staggered (to challenge balance) x8, x5    4/29  There-ex:  Nu-step warm up lvl 3  Row 3x10 LF 30 lbs 1x12 2x12 35 lbs  Ankle DF 3x10 50 lbs RPE of 4     Nuero-re-ed: Air-ex: heel/toe rock 2x15 with min guarding  Air-ex march 2x15 with min guarding   Huddle lateral step 2 laps 5 hurdles  Hurdle forward/ backwards step 2 laps   There-act:  Sit to stand x10 and x7  Mild fatigue noted on second set     4/24  There-ex: Nu-step warm up lvl 3  Neuro-Re-ed  Air ex feet together 3x30sec  Lateral stepping on airex 2 laps 3x  4 way cone taps 3x  Hurdles tap then step over 3x  4 way hurdles 3x   4/22  There-ex: Nu-step warm up lvl 3 Seated marching red RTB 3x10 Neuro-Re-ed  Step on and off air-ex 2x10  4-way box Cone taps 2 taps on each cone 2 sets  Eccentric step downs 2in 2x8  Walking marches with high hip flex hold 2 sets     4/16 Nu-step L5 x12min  HR/TR 2x15 FA with  EC 3x30sec Slow march without UE support 2x10  Tandem walking along rail x1 lap Walking marches along rail x2 laps Gait with head turns/nods hall x1lap ea Lateral stepping in hall 1/2 hall x2 Retro walking  (SBA) 1/2 hall x2 Step ups onto airex x10ea fwd and lateral  STS x10 from elevated plinth, 2x10 after  lowering plinth Step ups 6" 2x5ea Eccentric step downs from 2" step with min UE support x10ea   4/10 Reviewed PMHx, current function, pain level, HEP compliance, and response to prior Rx.  Reviewed HEP  Seated LAQ with GTB 2x10 bilat Seated hip abd with GTB 2x10   Pt ambulated 2 laps (600 ft) in 3 min 40 sec.  Pt has decreased bilat foot clearance.  He has decreased stance time on R LE and increased Wb'ing thru L LE.  Pt sidestepped at rail without UE support 2 x 2 laps  Standing on airex x 1 min with FA and 3x30 sec with NBOS Standing in staggered stance x 30 sec bilat Modified tandem stance with occasional UE support  x30 sec bilat    PATIENT EDUCATION:  Education details: HEP, symptom management, exercise form, relevant anatomy, and POC Person educated: Patient Education method: Explanation, Demonstration, Tactile cues, Verbal cues, and Handouts Education comprehension: verbalized understanding  HOME EXERCISE PROGRAM: [id number]  ASSESSMENT:  CLINICAL IMPRESSION: Pt significantly limited by R knee with WB tasks. Denies pain in knee, but feels it buckling. Unable to complete slide lunges on R LE due to buckling. He did well with leg press and knee extension machine strengthening today. Discussed incorporating these into gym routine. Gait affected by R knee due to tendency to hyperextend. Unsteadiness present with balance tasks. Utilized gait belt during session.    OBJECTIVE IMPAIRMENTS: Abnormal gait, decreased activity tolerance, decreased balance, decreased coordination, decreased endurance, decreased mobility, difficulty walking, decreased strength, and hypomobility.   ACTIVITY LIMITATIONS: carrying, lifting, bending, standing, squatting, stairs, bed mobility, bathing, toileting, and dressing  PARTICIPATION LIMITATIONS: cleaning, laundry, shopping, community activity, occupation, and yard work  PERSONAL FACTORS: 1-2 comorbidities: polyneuropathy  are also affecting  patient's functional outcome.   REHAB POTENTIAL: Good  CLINICAL DECISION MAKING: Evolving general decline in balance and endurance   EVALUATION COMPLEXITY: Mod    GOALS: Goals reviewed with patient? Yes  SHORT TERM GOALS: Target date: 06/02/2023   Pt will be able to complete standing feet together balance exercises with minimal sway and support for balance progression. Baseline: Goal status: INITIAL  2.  Pt will increase LE strength by 15% for physical conditioning.  Baseline:  Goal status: INITIAL  3.  Therapy will perform Berg balance test next visit  Baseline:  Goal status: INITIAL   LONG TERM GOALS: Target date: 06/30/2023   Pt will be able to ambulate community distances with no increase in symptoms for functional ADL movement. Baseline:  Goal status: INITIAL  2.  Pt will increase bilateral LE strength by 30% for physical conditioning. Baseline:  Goal status: INITIAL  3.  Pt will be report no falls while reporting to PT for functional endurance.  Baseline:  Goal status: INITIAL   PLAN:  PT FREQUENCY: 1-2x/week  PT DURATION: 8 weeks  PLANNED INTERVENTIONS: 97110-Therapeutic exercises, 97530- Therapeutic activity, [id number]- Neuromuscular re-education, 97535- Self Care, [id number]- Manual therapy, [id number]- Gait training, [phone]- Aquatic Therapy, and [phone]- Electrical stimulation (unattended)  PLAN FOR NEXT SESSION:  Cont with balance training, gait, LE strengthening (with awareness of recent gallbladder surgery), functional endurance, and functional mobility.  Berg balance assessment.      Fronie Jewett Abisola Carrero, PTA 06/04/2023 2:58 PM

## 2023-06-09 ENCOUNTER — Ambulatory Visit (HOSPITAL_BASED_OUTPATIENT_CLINIC_OR_DEPARTMENT_OTHER): Admitting: Physical Therapy

## 2023-06-09 ENCOUNTER — Encounter (HOSPITAL_BASED_OUTPATIENT_CLINIC_OR_DEPARTMENT_OTHER): Payer: Self-pay | Admitting: Physical Therapy

## 2023-06-09 DIAGNOSIS — M5416 Radiculopathy, lumbar region: Secondary | ICD-10-CM

## 2023-06-09 DIAGNOSIS — R262 Difficulty in walking, not elsewhere classified: Secondary | ICD-10-CM

## 2023-06-09 DIAGNOSIS — M6281 Muscle weakness (generalized): Secondary | ICD-10-CM

## 2023-06-09 DIAGNOSIS — M79604 Pain in right leg: Secondary | ICD-10-CM | POA: Diagnosis not present

## 2023-06-09 DIAGNOSIS — M5459 Other low back pain: Secondary | ICD-10-CM | POA: Diagnosis not present

## 2023-06-09 NOTE — Therapy (Signed)
 OUTPATIENT PHYSICAL THERAPY LOWER EXTREMITY TREATMENT   Patient Name: Patrick Kidd MRN: [id number] DOB:11/04/1945, 78 y.o., male Today's Date: 06/09/2023  END OF SESSION:  PT End of Session - 06/09/23 1105     Visit Number 8    Number of Visits 16    Date for PT Re-Evaluation 06/30/23    Authorization Type MCR A&B    PT Start Time 1103    PT Stop Time 1144    PT Time Calculation (min) 41 min    Activity Tolerance Patient tolerated treatment well;No increased pain    Behavior During Therapy [facility] for tasks assessed/performed                    Past Medical History:  Diagnosis Date   Acrophobia    Alcoholic cirrhosis (HCC) 10/04/2015   Anemia    Arthritis    foot by big toe   BPH associated with nocturia    Cataract    removed both eyes   Cholecystitis 05/2022   tx Abx   Chronic cough    PMH of   CLL (chronic lymphocytic leukemia) (HCC)    COVID-19 12/2018   Diabetes mellitus without complication (HCC)    Diverticulosis 07/03/2010   Colonoscopy.    Duodenal ulcer 2017   Fallen arches    Bilateral   GERD (gastroesophageal reflux disease)    Gout    Granuloma annulare    Hx of adenomatous colonic polyps multiple   Hydrocele 2011   Large septated right hydrocele   Liver cyst    Liver lesion    Nonspecific elevation of levels of transaminase or lactic acid dehydrogenase (LDH)    Obesity    Peripheral neuropathy    Plantar fasciitis    PMH of   Portal hypertension (HCC) 2017   Prostate cancer (HCC)    Right shoulder pain 11/2017   Sleep apnea    no cpap, patient denies   Thiamine  deficiency    Wears reading eyeglasses    WPW (Wolff-Parkinson-White syndrome) 02/05/2019   Past Surgical History:  Procedure Laterality Date   BACK SURGERY  04/28/2022   CATARACT EXTRACTION, BILATERAL  12/2011   Dr Gennie Kicks   COLONOSCOPY  2017   COLONOSCOPY W/ POLYPECTOMY  07/03/2010   2 adenomas, diverticulosis on right. Dr Willy Harvest   CYSTOSCOPY N/A 03/15/2018    Procedure: Ardith Bedford;  Surgeon: Samson Croak, MD;  Location[facility];  Service: Urology;  Laterality: N/A;  NO SEEDS FOUND IN BLADDER   ESOPHAGOGASTRODUODENOSCOPY (EGD) WITH PROPOFOL  N/A 01/08/2016   Procedure: ESOPHAGOGASTRODUODENOSCOPY (EGD) WITH PROPOFOL ;  Surgeon: Asencion Blacksmith, MD;  Location: [facility];  Service: Endoscopy;  Laterality: N/A;   FLEXIBLE SIGMOIDOSCOPY  2000   IR EXCHANGE BILIARY DRAIN  02/17/2023   IR PATIENT EVAL TECH 0-60 MINS  12/26/2022   IR PERC CHOLECYSTOSTOMY  12/05/2022   IR PERC CHOLECYSTOSTOMY  12/09/2022   IR RADIOLOGIST EVAL & MGMT  01/16/2023   RADIOACTIVE SEED IMPLANT N/A 03/15/2018   Procedure: RADIOACTIVE SEED IMPLANT/BRACHYTHERAPY IMPLANT;  Surgeon: Samson Croak, MD;  Location: [facility] ;  Service: Urology;  Laterality: N/A;   69 SEEDS IMPLANTED   RADIOLOGY WITH ANESTHESIA N/A 12/09/2022   Procedure: IR WITH ANESTHESIA;  Surgeon: Radiologist, Medication, MD;  Location: [facility];  Service: Radiology;  Laterality: N/A;   SIGMOIDOSCOPY     SPACE OAR INSTILLATION N/A 03/15/2018   Procedure: SPACE OAR INSTILLATION;  Surgeon: Parke Boll,  Willia Harries, MD;  Location: [facility];  Service: Urology;  Laterality: N/A;   WISDOM TOOTH EXTRACTION     Patient Active Problem List   Diagnosis Date Noted   OSA (obstructive sleep apnea) 03/17/2023   Thrombocytopenia (HCC) 03/17/2023   Urinary incontinence 06/25/2021   Degenerative lumbar spinal stenosis 04/30/2021   Degenerative arthritis of knee, bilateral 03/27/2021   Chronic lymphocytic leukemia (HCC) 11/08/2020   Insulin -requiring or dependent type II diabetes mellitus (HCC) 09/25/2020   Thiamine  deficiency    Paroxysmal atrial fibrillation (HCC) 07/19/2019   Allergic rhinitis 06/09/2019   Erectile dysfunction due to diabetes mellitus (HCC) 06/09/2019   Overweight with body mass index (BMI) of 28 to 28.9 in adult 03/17/2019   WPW  (Wolff-Parkinson-White syndrome) 02/05/2019   Encephalopathy, hepatic (HCC)    Degenerative disc disease, cervical 07/21/2018   Cervical radiculopathy 07/16/2018   OAB (overactive bladder) 07/06/2018   Malignant neoplasm of prostate (HCC) 01/13/2018   Hyperlipidemia LDL goal <100 11/11/2017   Essential hypertension 11/11/2017   BPH associated with nocturia 11/11/2017   Erectile dysfunction due to arterial insufficiency 04/07/2017   Peripheral vascular disease (HCC) 06/25/2016   Alcoholic cirrhosis (HCC) 10/04/2015   Type 2 diabetes mellitus with complication, with long-term current use of insulin  (HCC) 08/22/2015   Hypersomnolence 03/19/2014   Peripheral neuropathy 01/11/2013   Polyclonal gammopathy 01/11/2013   GERD 10/04/2009   Benign prostatic hyperplasia without lower urinary tract symptoms 02/04/1999   Gout, unspecified 02/04/1999   Portal hypertension (HCC) 02/04/1999    PCP: [name], MD  REFERRING PROVIDER:  [name], MD/Henson, Vickie L, MD      REFERRING DIAG: R53.81 (ICD-10-CM) - Physical deconditioning   THERAPY DIAG:  Physical deconditioning  Rationale for Evaluation and Treatment: Rehabilitation  ONSET DATE: Pt has had a recent hospitalization and feels a decrease in endurance  SUBJECTIVE:   SUBJECTIVE STATEMENT: 5/6 Pt reports knee and hip are still bothering him but he feels like his leg is getting stronger.    PERTINENT HISTORY: Pt has a 30# lifting restriction for 6 weeks to 2 months per pt since having gallbladder surgery Peripheral neuropathy,GERD, Degenerative lumbar spinal stenosis, urinary incontinence, cervical radiculopathy, essential hypertension, peripheral vascular disease, portal hypertension, right shoulder pain, plantar fasciitis, obesity, prostate cancer, fallen arches, cataract, arthritis,  CLL (chronic lymphocytic leukemia)  WPW (Wolff-Parkinson-White syndrome), Pt states it doesn't affect him  OA in bilat knees Lumbar  decompression surgery on 04/27/2022   PAIN:  Are you having pain? Yes: NPRS scale: no pain just soreness in back and knees after the stability class Pain location: knees Pain description: dull Aggravating factors: walking Relieving factors: rest  PRECAUTIONS: Fall  RED FLAGS: None   WEIGHT BEARING RESTRICTIONS: No  FALLS:  Has patient fallen in last 6 months? Yes. Number of falls 1  LIVING ENVIRONMENT: One step  OCCUPATION: semi-working real Chief Technology Officer  PLOF: Independent  PATIENT GOALS: get legs stronger, more confident in balance  NEXT MD VISIT: nothing upcoming  OBJECTIVE:  Note: Objective measures were completed at Evaluation unless otherwise noted.  DIAGNOSTIC FINDINGS: Ultrasound evaluation prior to injection reveals mild joint effusion. Mild degeneration medial joint line.   PATIENT SURVEYS:  LEFS 41/80  COGNITION: Overall cognitive status: Within functional limits for tasks assessed     SENSATION: Proprioception: Impaired   EDEMA:  No edema noted  MUSCLE LENGTH:   POSTURE: rounded shoulders, forward head, increased thoracic kyphosis, and flexed trunk   PALPATION: No  tenderness to palpation noted  LOWER EXTREMITY ROM:  Passive ROM Right eval Left eval  Hip flexion [facility]  Hip extension    Hip abduction    Hip adduction    Hip internal rotation    Hip external rotation    Knee flexion [facility] WFL  Knee extension [facility]  Ankle dorsiflexion    Ankle plantarflexion    Ankle inversion    Ankle eversion     (Blank rows = not tested)  LOWER EXTREMITY MMT:  MMT Right eval Left eval  Hip flexion 17.2 16.8  Hip extension    Hip abduction 21.9 22.3  Hip adduction    Hip internal rotation    Hip external rotation    Knee flexion    Knee extension 21.8 21.6  Ankle dorsiflexion    Ankle plantarflexion    Ankle inversion    Ankle eversion     (Blank rows = not tested)  LOWER EXTREMITY SPECIAL TESTS:    FUNCTIONAL TESTS:     GAIT: Noticed decreased stride length, forward lean, increased thoracic kyphosis and relative femoral retroversion                                                                                                                                TREATMENT DATE:   5/6 There-ex:  Nu step lvl 3  Leg press 3x10 110lbs  Seated hip abduction red RTB rpe 3 3x10  Neuro-Re-ed  Air ex SLS 3x10 5sec hold  Side stepping airex 3x  Rock fwd bckwd 3x10  5/1  There-ex:  Scifit bike L3 x44min  Leg Press 110lbs 2x15 Knee extension machine 25# 2x15ea (cued to place more weight through R LE) Slide lunges x5 (stopped with RLE due to inability.) x10L    Nuero-re-ed: Air-ex: heel/toe rock 2x15 with light CGA Walking marching along rail x2laps Gait with head turns/nods in hall x 1lap each (light CGA) Sit to stand staggered (to challenge balance) x8, x5    4/29  There-ex:  Nu-step warm up lvl 3  Row 3x10 LF 30 lbs 1x12 2x12 35 lbs  Ankle DF 3x10 50 lbs RPE of 4     Nuero-re-ed: Air-ex: heel/toe rock 2x15 with min guarding  Air-ex march 2x15 with min guarding   Huddle lateral step 2 laps 5 hurdles  Hurdle forward/ backwards step 2 laps   There-act:  Sit to stand x10 and x7  Mild fatigue noted on second set     4/24  There-ex: Nu-step warm up lvl 3  Neuro-Re-ed  Air ex feet together 3x30sec  Lateral stepping on airex 2 laps 3x  4 way cone taps 3x  Hurdles tap then step over 3x  4 way hurdles 3x   4/22  There-ex: Nu-step warm up lvl 3 Seated marching red RTB 3x10 Neuro-Re-ed  Step on and off air-ex 2x10  4-way box Cone taps 2 taps on each cone 2 sets  Eccentric step downs 2in 2x8  Walking marches with high hip flex hold 2 sets     4/16 Nu-step L5 x50min  HR/TR 2x15 FA with EC 3x30sec Slow march without UE support 2x10  Tandem walking along rail x1 lap Walking marches along rail x2  laps Gait with head turns/nods hall x1lap ea Lateral stepping in hall 1/2 hall x2 Retro walking  (SBA) 1/2 hall x2 Step ups onto airex x10ea fwd and lateral  STS x10 from elevated plinth, 2x10 after lowering plinth Step ups 6" 2x5ea Eccentric step downs from 2" step with min UE support x10ea   4/10 Reviewed PMHx, current function, pain level, HEP compliance, and response to prior Rx.  Reviewed HEP  Seated LAQ with GTB 2x10 bilat Seated hip abd with GTB 2x10   Pt ambulated 2 laps (600 ft) in 3 min 40 sec.  Pt has decreased bilat foot clearance.  He has decreased stance time on R LE and increased Wb'ing thru L LE.  Pt sidestepped at rail without UE support 2 x 2 laps  Standing on airex x 1 min with FA and 3x30 sec with NBOS Standing in staggered stance x 30 sec bilat Modified tandem stance with occasional UE support  x30 sec bilat    PATIENT EDUCATION:  Education details: HEP, symptom management, exercise form, relevant anatomy, and POC Person educated: Patient Education method: Explanation, Demonstration, Tactile cues, Verbal cues, and Handouts Education comprehension: verbalized understanding  HOME EXERCISE PROGRAM: [id number]  ASSESSMENT:  CLINICAL IMPRESSION: 5/6 Pt warmed up on the nustep lvl 3 for with no increase in symptoms. Strength exercises and neuro re-ed exercises were performed today. All were done with cueing for posture and muscle activation. Pt demonstrated proper body control throughout the session. Pt will continue to benefit from skilled physical therapy to improve balance for ADL's.     OBJECTIVE IMPAIRMENTS: Abnormal gait, decreased activity tolerance, decreased balance, decreased coordination, decreased endurance, decreased mobility, difficulty walking, decreased strength, and hypomobility.   ACTIVITY LIMITATIONS: carrying, lifting, bending, standing, squatting, stairs, bed mobility, bathing, toileting, and dressing  PARTICIPATION LIMITATIONS:  cleaning, laundry, shopping, community activity, occupation, and yard work  PERSONAL FACTORS: 1-2 comorbidities: polyneuropathy  are also affecting patient's functional outcome.   REHAB POTENTIAL: Good  CLINICAL DECISION MAKING: Evolving general decline in balance and endurance   EVALUATION COMPLEXITY: Mod    GOALS: Goals reviewed with patient? Yes  SHORT TERM GOALS: Target date: 06/02/2023   Pt will be able to complete standing feet together balance exercises with minimal sway and support for balance progression. Baseline: Goal status: INITIAL  2.  Pt will increase LE strength by 15% for physical conditioning.  Baseline:  Goal status: INITIAL  3.  Therapy will perform Berg balance test next visit  Baseline:  Goal status: INITIAL   LONG TERM GOALS: Target date: 06/30/2023   Pt will be able to ambulate community distances with no increase in symptoms for functional ADL movement. Baseline:  Goal status: INITIAL  2.  Pt will increase bilateral LE strength by 30% for physical conditioning. Baseline:  Goal status: INITIAL  3.  Pt will be report no falls while reporting to PT for functional endurance.  Baseline:  Goal status: INITIAL   PLAN:  PT FREQUENCY: 1-2x/week  PT DURATION: 8 weeks  PLANNED INTERVENTIONS: 97110-Therapeutic exercises, 97530- Therapeutic activity, [id number]- Neuromuscular re-education, 97535- Self Care, [id number]- Manual therapy, [id number]- Gait training, [phone]-  Aquatic Therapy, and [id number]- Electrical stimulation (unattended)  PLAN FOR NEXT SESSION:  Cont with balance training, gait, LE strengthening (with awareness of recent gallbladder surgery), functional endurance, and functional mobility.  Berg balance assessment.      Katheran Palms, Student-PT 06/09/2023 11:53 AM

## 2023-06-11 ENCOUNTER — Ambulatory Visit (HOSPITAL_BASED_OUTPATIENT_CLINIC_OR_DEPARTMENT_OTHER)

## 2023-06-11 ENCOUNTER — Encounter (HOSPITAL_BASED_OUTPATIENT_CLINIC_OR_DEPARTMENT_OTHER): Payer: Self-pay

## 2023-06-11 DIAGNOSIS — R262 Difficulty in walking, not elsewhere classified: Secondary | ICD-10-CM | POA: Diagnosis not present

## 2023-06-11 DIAGNOSIS — M5459 Other low back pain: Secondary | ICD-10-CM

## 2023-06-11 DIAGNOSIS — M79604 Pain in right leg: Secondary | ICD-10-CM | POA: Diagnosis not present

## 2023-06-11 DIAGNOSIS — M5416 Radiculopathy, lumbar region: Secondary | ICD-10-CM | POA: Diagnosis not present

## 2023-06-11 DIAGNOSIS — M6281 Muscle weakness (generalized): Secondary | ICD-10-CM

## 2023-06-11 NOTE — Therapy (Signed)
 OUTPATIENT PHYSICAL THERAPY LOWER EXTREMITY TREATMENT   Patient Name: Patrick Kidd MRN: [id number] DOB:22-May-1945, 78 y.o., male Today's Date: 06/11/2023  END OF SESSION:  PT End of Session - 06/11/23 1026     Visit Number 9    Number of Visits 16    Date for PT Re-Evaluation 06/30/23    Authorization Type MCR A&B    PT Start Time 1024   pt arrived late   PT Stop Time 1059    PT Time Calculation (min) 35 min    Activity Tolerance Patient tolerated treatment well    Behavior During Therapy WFL for tasks assessed/performed                     Past Medical History:  Diagnosis Date   Acrophobia    Alcoholic cirrhosis (HCC) 10/04/2015   Anemia    Arthritis    foot by big toe   BPH associated with nocturia    Cataract    removed both eyes   Cholecystitis 05/2022   tx Abx   Chronic cough    PMH of   CLL (chronic lymphocytic leukemia) (HCC)    COVID-19 12/2018   Diabetes mellitus without complication (HCC)    Diverticulosis 07/03/2010   Colonoscopy.    Duodenal ulcer 2017   Fallen arches    Bilateral   GERD (gastroesophageal reflux disease)    Gout    Granuloma annulare    Hx of adenomatous colonic polyps multiple   Hydrocele 2011   Large septated right hydrocele   Liver cyst    Liver lesion    Nonspecific elevation of levels of transaminase or lactic acid dehydrogenase (LDH)    Obesity    Peripheral neuropathy    Plantar fasciitis    PMH of   Portal hypertension (HCC) 2017   Prostate cancer (HCC)    Right shoulder pain 11/2017   Sleep apnea    no cpap, patient denies   Thiamine  deficiency    Wears reading eyeglasses    WPW (Wolff-Parkinson-White syndrome) 02/05/2019   Past Surgical History:  Procedure Laterality Date   BACK SURGERY  04/28/2022   CATARACT EXTRACTION, BILATERAL  12/2011   Dr Gennie Kicks   COLONOSCOPY  2017   COLONOSCOPY W/ POLYPECTOMY  07/03/2010   2 adenomas, diverticulosis on right. Dr Willy Harvest   CYSTOSCOPY N/A 03/15/2018    Procedure: Ardith Bedford;  Surgeon: Samson Croak, MD;  Location: [facility];  Service: Urology;  Laterality: N/A;  NO SEEDS FOUND IN BLADDER   ESOPHAGOGASTRODUODENOSCOPY (EGD) WITH PROPOFOL  N/A 01/08/2016   Procedure: ESOPHAGOGASTRODUODENOSCOPY (EGD) WITH PROPOFOL ;  Surgeon: Asencion Blacksmith, MD;  Location: [facility];  Service: Endoscopy;  Laterality: N/A;   FLEXIBLE SIGMOIDOSCOPY  2000   IR EXCHANGE BILIARY DRAIN  02/17/2023   IR PATIENT EVAL TECH 0-60 MINS  12/26/2022   IR PERC CHOLECYSTOSTOMY  12/05/2022   IR PERC CHOLECYSTOSTOMY  12/09/2022   IR RADIOLOGIST EVAL & MGMT  01/16/2023   RADIOACTIVE SEED IMPLANT N/A 03/15/2018   Procedure: RADIOACTIVE SEED IMPLANT/BRACHYTHERAPY IMPLANT;  Surgeon: Samson Croak, MD;  Location: [facility];  Service: Urology;  Laterality: N/A;   51 SEEDS IMPLANTED   RADIOLOGY WITH ANESTHESIA N/A 12/09/2022   Procedure: IR WITH ANESTHESIA;  Surgeon: Radiologist, Medication, MD;  Location: [facility];  Service: Radiology;  Laterality: N/A;   SIGMOIDOSCOPY     SPACE OAR INSTILLATION N/A 03/15/2018   Procedure: SPACE OAR INSTILLATION;  Surgeon: Samson Croak, MD;  Location: [facility];  Service: Urology;  Laterality: N/A;   WISDOM TOOTH EXTRACTION     Patient Active Problem List   Diagnosis Date Noted   OSA (obstructive sleep apnea) 03/17/2023   Thrombocytopenia (HCC) 03/17/2023   Urinary incontinence 06/25/2021   Degenerative lumbar spinal stenosis 04/30/2021   Degenerative arthritis of knee, bilateral 03/27/2021   Chronic lymphocytic leukemia (HCC) 11/08/2020   Insulin -requiring or dependent type II diabetes mellitus (HCC) 09/25/2020   Thiamine  deficiency    Paroxysmal atrial fibrillation (HCC) 07/19/2019   Allergic rhinitis 06/09/2019   Erectile dysfunction due to diabetes mellitus (HCC) 06/09/2019   Overweight with body mass index (BMI) of 28 to 28.9 in adult 03/17/2019   WPW  (Wolff-Parkinson-White syndrome) 02/05/2019   Encephalopathy, hepatic (HCC)    Degenerative disc disease, cervical 07/21/2018   Cervical radiculopathy 07/16/2018   OAB (overactive bladder) 07/06/2018   Malignant neoplasm of prostate (HCC) 01/13/2018   Hyperlipidemia LDL goal <100 11/11/2017   Essential hypertension 11/11/2017   BPH associated with nocturia 11/11/2017   Erectile dysfunction due to arterial insufficiency 04/07/2017   Peripheral vascular disease (HCC) 06/25/2016   Alcoholic cirrhosis (HCC) 10/04/2015   Type 2 diabetes mellitus with complication, with long-term current use of insulin  (HCC) 08/22/2015   Hypersomnolence 03/19/2014   Peripheral neuropathy 01/11/2013   Polyclonal gammopathy 01/11/2013   GERD 10/04/2009   Benign prostatic hyperplasia without lower urinary tract symptoms 02/04/1999   Gout, unspecified 02/04/1999   Portal hypertension (HCC) 02/04/1999    PCP: [name], MD  REFERRING PROVIDER:  [name], MD/Henson, Marcos Sevin, MD      REFERRING DIAG: R53.81 (ICD-10-CM) - Physical deconditioning   THERAPY DIAG:  Physical deconditioning  Rationale for Evaluation and Treatment: Rehabilitation  ONSET DATE: Pt has had a recent hospitalization and feels a decrease in endurance  SUBJECTIVE:   SUBJECTIVE STATEMENT: 5/6 Pt reports knee and hip are still bothering him but he feels like his leg is getting stronger.    PERTINENT HISTORY: Pt has a 30# lifting restriction for 6 weeks to 2 months per pt since having gallbladder surgery Peripheral neuropathy,GERD, Degenerative lumbar spinal stenosis, urinary incontinence, cervical radiculopathy, essential hypertension, peripheral vascular disease, portal hypertension, right shoulder pain, plantar fasciitis, obesity, prostate cancer, fallen arches, cataract, arthritis,  CLL (chronic lymphocytic leukemia)  WPW (Wolff-Parkinson-White syndrome), Pt states it doesn't affect him  OA in bilat knees Lumbar  decompression surgery on 04/27/2022   PAIN:  Are you having pain? Yes: NPRS scale: no pain just soreness in back and knees after the stability class Pain location: knees Pain description: dull Aggravating factors: walking Relieving factors: rest  PRECAUTIONS: Fall  RED FLAGS: None   WEIGHT BEARING RESTRICTIONS: No  FALLS:  Has patient fallen in last 6 months? Yes. Number of falls 1  LIVING ENVIRONMENT: One step  OCCUPATION: semi-working real Chief Technology [employer]  PLOF: Independent  PATIENT GOALS: get legs stronger, more confident in balance  NEXT MD VISIT: nothing upcoming  OBJECTIVE:  Note: Objective measures were completed at Evaluation unless otherwise noted.  DIAGNOSTIC FINDINGS: Ultrasound evaluation prior to injection reveals mild joint effusion. Mild degeneration medial joint line.   PATIENT SURVEYS:  LEFS 41/80  COGNITION: Overall cognitive status: Within functional limits for tasks assessed     SENSATION: Proprioception: Impaired   EDEMA:  No edema noted  MUSCLE LENGTH:   POSTURE: rounded shoulders, forward head, increased thoracic kyphosis, and flexed trunk  PALPATION: No tenderness to palpation noted  LOWER EXTREMITY ROM:  Passive ROM Right eval Left eval  Hip flexion [facility]  Hip extension    Hip abduction    Hip adduction    Hip internal rotation    Hip external rotation    Knee flexion [facility] WFL  Knee extension [facility]  Ankle dorsiflexion    Ankle plantarflexion    Ankle inversion    Ankle eversion     (Blank rows = not tested)  LOWER EXTREMITY MMT:  MMT Right eval Left eval  Hip flexion 17.2 16.8  Hip extension    Hip abduction 21.9 22.3  Hip adduction    Hip internal rotation    Hip external rotation    Knee flexion    Knee extension 21.8 21.6  Ankle dorsiflexion    Ankle plantarflexion    Ankle inversion    Ankle eversion     (Blank rows = not tested)  LOWER EXTREMITY SPECIAL TESTS:    FUNCTIONAL TESTS:     GAIT: Noticed decreased stride length, forward lean, increased thoracic kyphosis and relative femoral retroversion                                                                                                                                TREATMENT DATE:    5/8  There-ex:  Nu step lvl 3  Sit to stands 2x10   Neuro-Re-ed   Walking marches x2 laps Lateral stepping on airex beam x4 laps Air ex SLS 3x10 5sec hold  Side stepping airex 3x  Rock fwd bckwd 3x10 Cone reaches 3 way at rail x10ea/bil March on airex 2x10 Step up on airex 2x10R LE    5/6 There-ex:  Nu step lvl 3  Leg press 3x10 110lbs  Seated hip abduction red RTB rpe 3 3x10  Neuro-Re-ed  Air ex SLS 3x10 5sec hold  Side stepping airex 3x  Rock fwd bckwd 3x10  5/1  There-ex:  Scifit bike L3 x26min  Leg Press 110lbs 2x15 Knee extension machine 25# 2x15ea (cued to place more weight through R LE) Slide lunges x5 (stopped with RLE due to inability.) x10L    Nuero-re-ed: Air-ex: heel/toe rock 2x15 with light CGA Walking marching along rail x2laps Gait with head turns/nods in hall x 1lap each (light CGA) Sit to stand staggered (to challenge balance) x8, x5    4/29  There-ex:  Nu-step warm up lvl 3  Row 3x10 LF 30 lbs 1x12 2x12 35 lbs  Ankle DF 3x10 50 lbs RPE of 4     Nuero-re-ed: Air-ex: heel/toe rock 2x15 with min guarding  Air-ex march 2x15 with min guarding   Huddle lateral step 2 laps 5 hurdles  Hurdle forward/ backwards step 2 laps   There-act:  Sit to stand x10 and x7  Mild fatigue noted on second set     4/24  There-ex:  Nu-step warm up lvl 3  Neuro-Re-ed  Air ex feet together 3x30sec  Lateral stepping on airex 2 laps 3x  4 way cone taps 3x  Hurdles tap then step over 3x  4 way hurdles 3x    PATIENT EDUCATION:  Education details: HEP, symptom management, exercise form, relevant anatomy, and POC Person  educated: Patient Education method: Explanation, Demonstration, Tactile cues, Verbal cues, and Handouts Education comprehension: verbalized understanding  HOME EXERCISE PROGRAM: [id number]  ASSESSMENT:  CLINICAL IMPRESSION: Continued to work on stability and balance in clinic. Fatigue in R quad present with airex step ups, but no significant instability observed. Challenged by uneven surfaces and requires mild UE support on railing.     OBJECTIVE IMPAIRMENTS: Abnormal gait, decreased activity tolerance, decreased balance, decreased coordination, decreased endurance, decreased mobility, difficulty walking, decreased strength, and hypomobility.   ACTIVITY LIMITATIONS: carrying, lifting, bending, standing, squatting, stairs, bed mobility, bathing, toileting, and dressing  PARTICIPATION LIMITATIONS: cleaning, laundry, shopping, community activity, occupation, and yard work  PERSONAL FACTORS: 1-2 comorbidities: polyneuropathy are also affecting patient's functional outcome.   REHAB POTENTIAL: Good  CLINICAL DECISION MAKING: Evolving general decline in balance and endurance   EVALUATION COMPLEXITY: Mod    GOALS: Goals reviewed with patient? Yes  SHORT TERM GOALS: Target date: 06/02/2023   Pt will be able to complete standing feet together balance exercises with minimal sway and support for balance progression. Baseline: Goal status: INITIAL  2.  Pt will increase LE strength by 15% for physical conditioning.  Baseline:  Goal status: INITIAL  3.  Therapy will perform Berg balance test next visit  Baseline:  Goal status: INITIAL   LONG TERM GOALS: Target date: 06/30/2023   Pt will be able to ambulate community distances with no increase in symptoms for functional ADL movement. Baseline:  Goal status: INITIAL  2.  Pt will increase bilateral LE strength by 30% for physical conditioning. Baseline:  Goal status: INITIAL  3.  Pt will be report no falls while reporting to PT  for functional endurance.  Baseline:  Goal status: INITIAL   PLAN:  PT FREQUENCY: 1-2x/week  PT DURATION: 8 weeks  PLANNED INTERVENTIONS: [id number]-Therapeutic exercises, 97530- Therapeutic activity, [id number]- Neuromuscular re-education, 97535- Self Care, [id number]- Manual therapy, [id number]- Gait training, [id number]- Aquatic Therapy, and [phone]- Electrical stimulation (unattended)  PLAN FOR NEXT SESSION:  Cont with balance training, gait, LE strengthening (with awareness of recent gallbladder surgery), functional endurance, and functional mobility.  Berg balance assessment.      Fronie Jewett Zaira Iacovelli, PTA 06/11/2023 1:11 PM

## 2023-06-16 ENCOUNTER — Ambulatory Visit (HOSPITAL_BASED_OUTPATIENT_CLINIC_OR_DEPARTMENT_OTHER)

## 2023-06-16 ENCOUNTER — Encounter (HOSPITAL_BASED_OUTPATIENT_CLINIC_OR_DEPARTMENT_OTHER): Payer: Self-pay

## 2023-06-16 DIAGNOSIS — R262 Difficulty in walking, not elsewhere classified: Secondary | ICD-10-CM | POA: Diagnosis not present

## 2023-06-16 DIAGNOSIS — M5416 Radiculopathy, lumbar region: Secondary | ICD-10-CM | POA: Diagnosis not present

## 2023-06-16 DIAGNOSIS — M5459 Other low back pain: Secondary | ICD-10-CM

## 2023-06-16 DIAGNOSIS — M79604 Pain in right leg: Secondary | ICD-10-CM

## 2023-06-16 DIAGNOSIS — M6281 Muscle weakness (generalized): Secondary | ICD-10-CM | POA: Diagnosis not present

## 2023-06-16 NOTE — Therapy (Cosign Needed)
 OUTPATIENT PHYSICAL THERAPY LOWER EXTREMITY TREATMENT Progress Note Reporting Period 05/05/2023 to 06/30/2023  See note below for Objective Data and Assessment of Progress/Goals.       Patient Name: Patrick Kidd MRN: [id number] DOB:Jun 09, 1945, 78 y.o., male Today's Date: 06/16/2023  END OF SESSION:  PT End of Session - 06/16/23 1110     Visit Number 10    Number of Visits 16    Date for PT Re-Evaluation 06/30/23    Authorization Type MCR A&B    PT Start Time 1108   pt arrived late   PT Stop Time 1144    PT Time Calculation (min) 36 min    Activity Tolerance Patient tolerated treatment well    Behavior During Therapy [facility] for tasks assessed/performed                      Past Medical History:  Diagnosis Date   Acrophobia    Alcoholic cirrhosis (HCC) 10/04/2015   Anemia    Arthritis    foot by big toe   BPH associated with nocturia    Cataract    removed both eyes   Cholecystitis 05/2022   tx Abx   Chronic cough    PMH of   CLL (chronic lymphocytic leukemia) (HCC)    COVID-19 12/2018   Diabetes mellitus without complication (HCC)    Diverticulosis 07/03/2010   Colonoscopy.    Duodenal ulcer 2017   Fallen arches    Bilateral   GERD (gastroesophageal reflux disease)    Gout    Granuloma annulare    Hx of adenomatous colonic polyps multiple   Hydrocele 2011   Large septated right hydrocele   Liver cyst    Liver lesion    Nonspecific elevation of levels of transaminase or lactic acid dehydrogenase (LDH)    Obesity    Peripheral neuropathy    Plantar fasciitis    PMH of   Portal hypertension (HCC) 2017   Prostate cancer (HCC)    Right shoulder pain 11/2017   Sleep apnea    no cpap, patient denies   Thiamine  deficiency    Wears reading eyeglasses    WPW (Wolff-Parkinson-White syndrome) 02/05/2019   Past Surgical History:  Procedure Laterality Date   BACK SURGERY  04/28/2022   CATARACT EXTRACTION, BILATERAL  12/2011   Dr Gennie Kicks    COLONOSCOPY  2017   COLONOSCOPY W/ POLYPECTOMY  07/03/2010   2 adenomas, diverticulosis on right. Dr Willy Harvest   CYSTOSCOPY N/A 03/15/2018   Procedure: Ardith Bedford;  Surgeon: Samson Croak, MD;  Location: [facility];  Service: Urology;  Laterality: N/A;  NO SEEDS FOUND IN BLADDER   ESOPHAGOGASTRODUODENOSCOPY (EGD) WITH PROPOFOL  N/A 01/08/2016   Procedure: ESOPHAGOGASTRODUODENOSCOPY (EGD) WITH PROPOFOL ;  Surgeon: Asencion Blacksmith, MD;  Location: [facility];  Service: Endoscopy;  Laterality: N/A;   FLEXIBLE SIGMOIDOSCOPY  2000   IR EXCHANGE BILIARY DRAIN  02/17/2023   IR PATIENT EVAL TECH 0-60 MINS  12/26/2022   IR PERC CHOLECYSTOSTOMY  12/05/2022   IR PERC CHOLECYSTOSTOMY  12/09/2022   IR RADIOLOGIST EVAL & MGMT  01/16/2023   RADIOACTIVE SEED IMPLANT N/A 03/15/2018   Procedure: RADIOACTIVE SEED IMPLANT/BRACHYTHERAPY IMPLANT;  Surgeon: Samson Croak, MD;  Location: [facility] [place];  Service: Urology;  Laterality: N/A;   42 SEEDS IMPLANTED   RADIOLOGY WITH ANESTHESIA N/A 12/09/2022   Procedure: IR WITH ANESTHESIA;  Surgeon: Radiologist, Medication, MD;  Location: [facility];  Service: Radiology;  Laterality: N/A;   SIGMOIDOSCOPY     SPACE OAR INSTILLATION N/A 03/15/2018   Procedure: SPACE OAR INSTILLATION;  Surgeon: Samson Croak, MD;  Location: [facility];  Service: Urology;  Laterality: N/A;   WISDOM TOOTH EXTRACTION     Patient Active Problem List   Diagnosis Date Noted   OSA (obstructive sleep apnea) 03/17/2023   Thrombocytopenia (HCC) 03/17/2023   Urinary incontinence 06/25/2021   Degenerative lumbar spinal stenosis 04/30/2021   Degenerative arthritis of knee, bilateral 03/27/2021   Chronic lymphocytic leukemia (HCC) 11/08/2020   Insulin -requiring or dependent type II diabetes mellitus (HCC) 09/25/2020   Thiamine  deficiency    Paroxysmal atrial fibrillation (HCC) 07/19/2019   Allergic rhinitis 06/09/2019   Erectile  dysfunction due to diabetes mellitus (HCC) 06/09/2019   Overweight with body mass index (BMI) of 28 to 28.9 in adult 03/17/2019   WPW (Wolff-Parkinson-White syndrome) 02/05/2019   Encephalopathy, hepatic (HCC)    Degenerative disc disease, cervical 07/21/2018   Cervical radiculopathy 07/16/2018   OAB (overactive bladder) 07/06/2018   Malignant neoplasm of prostate (HCC) 01/13/2018   Hyperlipidemia LDL goal <100 11/11/2017   Essential hypertension 11/11/2017   BPH associated with nocturia 11/11/2017   Erectile dysfunction due to arterial insufficiency 04/07/2017   Peripheral vascular disease (HCC) 06/25/2016   Alcoholic cirrhosis (HCC) 10/04/2015   Type 2 diabetes mellitus with complication, with long-term current use of insulin  (HCC) 08/22/2015   Hypersomnolence 03/19/2014   Peripheral neuropathy 01/11/2013   Polyclonal gammopathy 01/11/2013   GERD 10/04/2009   Benign prostatic hyperplasia without lower urinary tract symptoms 02/04/1999   Gout, unspecified 02/04/1999   Portal hypertension (HCC) 02/04/1999    PCP: [name], MD  REFERRING PROVIDER:  [name], MD/Henson, Vickie L, MD      REFERRING DIAG: R53.81 (ICD-10-CM) - Physical deconditioning   THERAPY DIAG:  Physical deconditioning  Rationale for Evaluation and Treatment: Rehabilitation  ONSET DATE: Pt has had a recent hospitalization and feels a decrease in endurance  SUBJECTIVE:   SUBJECTIVE STATEMENT: Pt reports he continues to have stamina issues and gets out of breath easily.    PERTINENT HISTORY: Pt has a 30# lifting restriction for 6 weeks to 2 months per pt since having gallbladder surgery Peripheral neuropathy,GERD, Degenerative lumbar spinal stenosis, urinary incontinence, cervical radiculopathy, essential hypertension, peripheral vascular disease, portal hypertension, right shoulder pain, plantar fasciitis, obesity, prostate cancer, fallen arches, cataract, arthritis,  CLL (chronic  lymphocytic leukemia)  WPW (Wolff-Parkinson-White syndrome), Pt states it doesn't affect him  OA in bilat knees Lumbar decompression surgery on 04/27/2022   PAIN:  Are you having pain? Yes: NPRS scale: no pain just soreness in back and knees after the stability class Pain location: knees Pain description: dull Aggravating factors: walking Relieving factors: rest  PRECAUTIONS: Fall  RED FLAGS: None   WEIGHT BEARING RESTRICTIONS: No  FALLS:  Has patient fallen in last 6 months? Yes. Number of falls 1  LIVING ENVIRONMENT: One step  OCCUPATION: semi-working real Chief Technology Officer  PLOF: Independent  PATIENT GOALS: get legs stronger, more confident in balance  NEXT MD VISIT: nothing upcoming  OBJECTIVE:  Note: Objective measures were completed at Evaluation unless otherwise noted.  DIAGNOSTIC FINDINGS: Ultrasound evaluation prior to injection reveals mild joint effusion. Mild degeneration medial joint line.   PATIENT SURVEYS:  LEFS 41/80 5/13: 39/80  COGNITION: Overall cognitive status: Within functional limits for tasks assessed     SENSATION: Proprioception: Impaired   EDEMA:  No edema noted  MUSCLE LENGTH:   POSTURE: rounded shoulders, forward head, increased thoracic kyphosis, and flexed trunk   PALPATION: No tenderness to palpation noted  LOWER EXTREMITY ROM:  Passive ROM Right eval Left eval  Hip flexion [facility]  Hip extension    Hip abduction    Hip adduction    Hip internal rotation    Hip external rotation    Knee flexion [facility] WFL  Knee extension [facility]  Ankle dorsiflexion    Ankle plantarflexion    Ankle inversion    Ankle eversion     (Blank rows = not tested)  LOWER EXTREMITY MMT:  MMT Right eval Left eval Right 5/13 Left  5/13  Hip flexion 17.2 16.8 46.5 51.2  Hip extension      Hip abduction 21.9 22.3 47.7 41.8  Hip adduction      Hip internal rotation      Hip external rotation      Knee flexion      Knee extension  21.8 21.6 37.2 40.6  Ankle dorsiflexion      Ankle plantarflexion      Ankle inversion      Ankle eversion       (Blank rows = not tested)  LOWER EXTREMITY SPECIAL TESTS:    FUNCTIONAL TESTS:  BERG Balance Test          Date: 5/13  Sit to Stand 3  Standing unsupported 4  Sitting with back unsupported but feet supported 4  Stand to sit  4  Transfers    Standing unsupported with eyes closed 3  Standing unsupported feet together 4  From standing position, reach forward with outstretched arm 4  From standing position, pick up object from floor 3  From standing position, turn and look behind over each shoulder 4  Turn 360 2  Standing unsupported, alternately place foot on step 1  Standing unsupported, one foot in front 2  Standing on one leg 1  Total:  39/56     GAIT: Noticed decreased stride length, forward lean, increased thoracic kyphosis and relative femoral retroversion                                                                                                                                TREATMENT DATE:    5/13   Nu step lvl 5  Sit to stands 2x10   Randye Buttner  Updated MMT Goal review LEFS     5/8  There-ex:  Nu step lvl 3  Sit to stands 2x10   Neuro-Re-ed   Walking marches x2 laps Lateral stepping on airex beam x4 laps Air ex SLS 3x10 5sec hold  Side stepping airex 3x  Rock fwd bckwd 3x10 Cone reaches 3 way at rail x10ea/bil March on airex 2x10 Step up on airex 2x10R LE    5/6 There-ex:  Nu step lvl 3  Leg press  3x10 110lbs  Seated hip abduction red RTB rpe 3 3x10  Neuro-Re-ed  Air ex SLS 3x10 5sec hold  Side stepping airex 3x  Rock fwd bckwd 3x10  5/1  There-ex:  Scifit bike L3 x66min  Leg Press 110lbs 2x15 Knee extension machine 25# 2x15ea (cued to place more weight through R LE) Slide lunges x5 (stopped with RLE due to inability.) x10L    Nuero-re-ed: Air-ex: heel/toe rock 2x15 with light  CGA Walking marching along rail x2laps Gait with head turns/nods in hall x 1lap each (light CGA) Sit to stand staggered (to challenge balance) x8, x5       PATIENT EDUCATION:  Education details: HEP, symptom management, exercise form, relevant anatomy, and POC Person educated: Patient Education method: Explanation, Demonstration, Tactile cues, Verbal cues, and Handouts Education comprehension: verbalized understanding  HOME EXERCISE PROGRAM: [id number]  ASSESSMENT:  CLINICAL IMPRESSION: Pt has attended 10 visits of PT thus far and has made steady progress. He has met 2/3 STG and 1/3 LTG. Significant improvement in strength bilaterally since IE. Berg score was 39/56 today. He is most challenged by SLS and tandem stance. Pt unable to raise self from standard height chair without assistance, but no problem with slightly elevated plinth. Will add goal of being able to transfer from standing position to the floor vv. as pt expressed desire to be able to do this in the event that he does fall. Pt will benefit from additional PT to continue to work on improving balance/endurance as well as strength.     OBJECTIVE IMPAIRMENTS: Abnormal gait, decreased activity tolerance, decreased balance, decreased coordination, decreased endurance, decreased mobility, difficulty walking, decreased strength, and hypomobility.   ACTIVITY LIMITATIONS: carrying, lifting, bending, standing, squatting, stairs, bed mobility, bathing, toileting, and dressing  PARTICIPATION LIMITATIONS: cleaning, laundry, shopping, community activity, occupation, and yard work  PERSONAL FACTORS: 1-2 comorbidities: polyneuropathy are also affecting patient's functional outcome.   REHAB POTENTIAL: Good  CLINICAL DECISION MAKING: Evolving general decline in balance and endurance   EVALUATION COMPLEXITY: Mod    GOALS: Goals reviewed with patient? Yes  SHORT TERM GOALS: Target date: 06/02/2023   Pt will be able to complete  standing feet together balance exercises with minimal sway and support for balance progression. Baseline: Goal status: IN PROGRESS 5/13  2.  Pt will increase LE strength by 15% for physical conditioning.  Baseline:  Goal status: MET 5/13  3.  Therapy will perform Berg balance test next visit  Baseline:  Goal status: MET 5/13   LONG TERM GOALS: Target date: 06/30/2023   Pt will be able to ambulate community distances with no increase in symptoms for functional ADL movement. Baseline:  Goal status: IN PROGRESS 5/13  2.  Pt will increase bilateral LE strength by 30% for physical conditioning. Baseline:  Goal status: MET 5/13  3.  Pt will be report no falls while reporting to PT for functional endurance.  Baseline:  Goal status: IN PROGRESS (no falls to date, but has had near falls) 5/13   PLAN:  PT FREQUENCY: 1-2x/week  PT DURATION: 8 weeks  PLANNED INTERVENTIONS: 97110-Therapeutic exercises, 97530- Therapeutic activity, [id number]- Neuromuscular re-education, 97535- Self Care, [id number]- Manual therapy, [id number]- Gait training, [phone]- Aquatic Therapy, and [phone]- Electrical stimulation (unattended)  PLAN FOR NEXT SESSION:  Cont with balance training, gait, LE strengthening (with awareness of recent gallbladder surgery), functional endurance, and functional mobility.  Berg balance assessment.      Fronie Jewett Jarrid Lienhard, PTA 06/16/2023 1:32 PM

## 2023-06-17 ENCOUNTER — Other Ambulatory Visit: Payer: Self-pay

## 2023-06-18 ENCOUNTER — Ambulatory Visit (HOSPITAL_BASED_OUTPATIENT_CLINIC_OR_DEPARTMENT_OTHER): Admitting: Physical Therapy

## 2023-06-18 ENCOUNTER — Encounter (HOSPITAL_BASED_OUTPATIENT_CLINIC_OR_DEPARTMENT_OTHER): Payer: Self-pay | Admitting: Physical Therapy

## 2023-06-18 DIAGNOSIS — M5459 Other low back pain: Secondary | ICD-10-CM

## 2023-06-18 DIAGNOSIS — M79604 Pain in right leg: Secondary | ICD-10-CM | POA: Diagnosis not present

## 2023-06-18 DIAGNOSIS — R262 Difficulty in walking, not elsewhere classified: Secondary | ICD-10-CM

## 2023-06-18 DIAGNOSIS — M5416 Radiculopathy, lumbar region: Secondary | ICD-10-CM | POA: Diagnosis not present

## 2023-06-18 DIAGNOSIS — M6281 Muscle weakness (generalized): Secondary | ICD-10-CM

## 2023-06-18 NOTE — Therapy (Signed)
 OUTPATIENT PHYSICAL THERAPY LOWER EXTREMITY TREATMENT Progress Note Reporting Period 05/05/2023 to 06/30/2023  See note below for Objective Data and Assessment of Progress/Goals.       Patient Name: Patrick Kidd MRN: [id number] DOB:1945/12/08, 78 y.o., male Today's Date: 06/19/2023  END OF SESSION:  PT End of Session - 06/18/23 1135     Visit Number 11    Number of Visits 16    Date for PT Re-Evaluation 06/30/23    Authorization Type MCR A&B    PT Start Time 1100    PT Stop Time 1143    PT Time Calculation (min) 43 min    Activity Tolerance Patient tolerated treatment well    Behavior During Therapy WFL for tasks assessed/performed                       Past Medical History:  Diagnosis Date   Acrophobia    Alcoholic cirrhosis (HCC) 10/04/2015   Anemia    Arthritis    foot by big toe   BPH associated with nocturia    Cataract    removed both eyes   Cholecystitis 05/2022   tx Abx   Chronic cough    PMH of   CLL (chronic lymphocytic leukemia) (HCC)    COVID-19 12/2018   Diabetes mellitus without complication (HCC)    Diverticulosis 07/03/2010   Colonoscopy.    Duodenal ulcer 2017   Fallen arches    Bilateral   GERD (gastroesophageal reflux disease)    Gout    Granuloma annulare    Hx of adenomatous colonic polyps multiple   Hydrocele 2011   Large septated right hydrocele   Liver cyst    Liver lesion    Nonspecific elevation of levels of transaminase or lactic acid dehydrogenase (LDH)    Obesity    Peripheral neuropathy    Plantar fasciitis    PMH of   Portal hypertension (HCC) 2017   Prostate cancer (HCC)    Right shoulder pain 11/2017   Sleep apnea    no cpap, patient denies   Thiamine  deficiency    Wears reading eyeglasses    WPW (Wolff-Parkinson-White syndrome) 02/05/2019   Past Surgical History:  Procedure Laterality Date   BACK SURGERY  04/28/2022   CATARACT EXTRACTION, BILATERAL  12/2011   Dr Gennie Kicks   COLONOSCOPY   2017   COLONOSCOPY W/ POLYPECTOMY  07/03/2010   2 adenomas, diverticulosis on right. Dr Willy Harvest   CYSTOSCOPY N/A 03/15/2018   Procedure: Ardith Bedford;  Surgeon: Samson Croak, MD;  Location: [facility];  Service: Urology;  Laterality: N/A;  NO SEEDS FOUND IN BLADDER   ESOPHAGOGASTRODUODENOSCOPY (EGD) WITH PROPOFOL  N/A 01/08/2016   Procedure: ESOPHAGOGASTRODUODENOSCOPY (EGD) WITH PROPOFOL ;  Surgeon: Asencion Blacksmith, MD;  Location: [facility];  Service: Endoscopy;  Laterality: N/A;   FLEXIBLE SIGMOIDOSCOPY  2000   IR EXCHANGE BILIARY DRAIN  02/17/2023   IR PATIENT EVAL TECH 0-60 MINS  12/26/2022   IR PERC CHOLECYSTOSTOMY  12/05/2022   IR PERC CHOLECYSTOSTOMY  12/09/2022   IR RADIOLOGIST EVAL & MGMT  01/16/2023   RADIOACTIVE SEED IMPLANT N/A 03/15/2018   Procedure: RADIOACTIVE SEED IMPLANT/BRACHYTHERAPY IMPLANT;  Surgeon: Samson Croak, MD;  Location: [facility];  Service: Urology;  Laterality: N/A;   46 SEEDS IMPLANTED   RADIOLOGY WITH ANESTHESIA N/A 12/09/2022   Procedure: IR WITH ANESTHESIA;  Surgeon: Radiologist, Medication, MD;  Location: [facility];  Service: Radiology;  Laterality: N/A;   SIGMOIDOSCOPY     SPACE OAR INSTILLATION N/A 03/15/2018   Procedure: SPACE OAR INSTILLATION;  Surgeon: Samson Croak, MD;  Location: [facility];  Service: Urology;  Laterality: N/A;   WISDOM TOOTH EXTRACTION     Patient Active Problem List   Diagnosis Date Noted   OSA (obstructive sleep apnea) 03/17/2023   Thrombocytopenia (HCC) 03/17/2023   Urinary incontinence 06/25/2021   Degenerative lumbar spinal stenosis 04/30/2021   Degenerative arthritis of knee, bilateral 03/27/2021   Chronic lymphocytic leukemia (HCC) 11/08/2020   Insulin -requiring or dependent type II diabetes mellitus (HCC) 09/25/2020   Thiamine  deficiency    Paroxysmal atrial fibrillation (HCC) 07/19/2019   Allergic rhinitis 06/09/2019   Erectile dysfunction due to  diabetes mellitus (HCC) 06/09/2019   Overweight with body mass index (BMI) of 28 to 28.9 in adult 03/17/2019   WPW (Wolff-Parkinson-White syndrome) 02/05/2019   Encephalopathy, hepatic (HCC)    Degenerative disc disease, cervical 07/21/2018   Cervical radiculopathy 07/16/2018   OAB (overactive bladder) 07/06/2018   Malignant neoplasm of prostate (HCC) 01/13/2018   Hyperlipidemia LDL goal <100 11/11/2017   Essential hypertension 11/11/2017   BPH associated with nocturia 11/11/2017   Erectile dysfunction due to arterial insufficiency 04/07/2017   Peripheral vascular disease (HCC) 06/25/2016   Alcoholic cirrhosis (HCC) 10/04/2015   Type 2 diabetes mellitus with complication, with long-term current use of insulin  (HCC) 08/22/2015   Hypersomnolence 03/19/2014   Peripheral neuropathy 01/11/2013   Polyclonal gammopathy 01/11/2013   GERD 10/04/2009   Benign prostatic hyperplasia without lower urinary tract symptoms 02/04/1999   Gout, unspecified 02/04/1999   Portal hypertension (HCC) 02/04/1999    PCP: [name], MD  REFERRING PROVIDER:  [name], MD/Henson, Vickie L, MD      REFERRING DIAG: R53.81 (ICD-10-CM) - Physical deconditioning   THERAPY DIAG:  Physical deconditioning  Rationale for Evaluation and Treatment: Rehabilitation  ONSET DATE: Pt has had a recent hospitalization and feels a decrease in endurance  SUBJECTIVE:   SUBJECTIVE STATEMENT: Pt reports he continues to have stamina issues and gets out of breath easily. He continues to want to be able to get up and down off the ground better.    PERTINENT HISTORY: Pt has a 30# lifting restriction for 6 weeks to 2 months per pt since having gallbladder surgery Peripheral neuropathy,GERD, Degenerative lumbar spinal stenosis, urinary incontinence, cervical radiculopathy, essential hypertension, peripheral vascular disease, portal hypertension, right shoulder pain, plantar fasciitis, obesity, prostate cancer,  fallen arches, cataract, arthritis,  CLL (chronic lymphocytic leukemia)  WPW (Wolff-Parkinson-White syndrome), Pt states it doesn't affect him  OA in bilat knees Lumbar decompression surgery on 04/27/2022   PAIN:  Are you having pain? Yes: NPRS scale: no pain just soreness in back and knees after the stability class Pain location: knees Pain description: dull Aggravating factors: walking Relieving factors: rest  PRECAUTIONS: Fall  RED FLAGS: None   WEIGHT BEARING RESTRICTIONS: No  FALLS:  Has patient fallen in last 6 months? Yes. Number of falls 1  LIVING ENVIRONMENT: One step  OCCUPATION: semi-working real Chief Technology Officer  PLOF: Independent  PATIENT GOALS: get legs stronger, more confident in balance  NEXT MD VISIT: nothing upcoming  OBJECTIVE:  Note: Objective measures were completed at Evaluation unless otherwise noted.  DIAGNOSTIC FINDINGS: Ultrasound evaluation prior to injection reveals mild joint effusion. Mild degeneration medial joint line.   PATIENT SURVEYS:  LEFS 41/80 5/13: 39/80  COGNITION: Overall cognitive status: Within functional limits for  tasks assessed     SENSATION: Proprioception: Impaired   EDEMA:  No edema noted  MUSCLE LENGTH:   POSTURE: rounded shoulders, forward head, increased thoracic kyphosis, and flexed trunk   PALPATION: No tenderness to palpation noted  LOWER EXTREMITY ROM:  Passive ROM Right eval Left eval  Hip flexion [facility]  Hip extension    Hip abduction    Hip adduction    Hip internal rotation    Hip external rotation    Knee flexion [facility] WFL  Knee extension [facility]  Ankle dorsiflexion    Ankle plantarflexion    Ankle inversion    Ankle eversion     (Blank rows = not tested)  LOWER EXTREMITY MMT:  MMT Right eval Left eval Right 5/13 Left  5/13  Hip flexion 17.2 16.8 46.5 51.2  Hip extension      Hip abduction 21.9 22.3 47.7 41.8  Hip adduction      Hip internal rotation      Hip external  rotation      Knee flexion      Knee extension 21.8 21.6 37.2 40.6  Ankle dorsiflexion      Ankle plantarflexion      Ankle inversion      Ankle eversion       (Blank rows = not tested)  LOWER EXTREMITY SPECIAL TESTS:    FUNCTIONAL TESTS:  BERG Balance Test          Date: 5/13  Sit to Stand 3  Standing unsupported 4  Sitting with back unsupported but feet supported 4  Stand to sit  4  Transfers    Standing unsupported with eyes closed 3  Standing unsupported feet together 4  From standing position, reach forward with outstretched arm 4  From standing position, pick up object from floor 3  From standing position, turn and look behind over each shoulder 4  Turn 360 2  Standing unsupported, alternately place foot on step 1  Standing unsupported, one foot in front 2  Standing on one leg 1  Total:  39/56     GAIT: Noticed decreased stride length, forward lean, increased thoracic kyphosis and relative femoral retroversion                                                                                                                                TREATMENT DATE:    5/15 There-ex:   Nu-step: Reviewed how to use of r cardiovalvular endurance training.   1.5 min interval 4x at an 80 SPM pace.   Narrow base eyes closed on air-ex 3x30   There-act:  Sit to stand 5x from lowest table position   Floor transfer training:  Prone to quadruped 3x3   UE support quadruped -> high kneel  3x   There-ex:  Leg press 3x12  80 lbs at and RPE of 6      5/13   Nu step lvl  5  Sit to stands 2x10   Randye Buttner  Updated MMT Goal review LEFS     5/8  There-ex:  Nu step lvl 3  Sit to stands 2x10   Neuro-Re-ed   Walking marches x2 laps Lateral stepping on airex beam x4 laps Air ex SLS 3x10 5sec hold  Side stepping airex 3x  Rock fwd bckwd 3x10 Cone reaches 3 way at rail x10ea/bil March on airex 2x10 Step up on airex 2x10R LE    5/6 There-ex:   Nu step lvl 3  Leg press 3x10 110lbs  Seated hip abduction red RTB rpe 3 3x10  Neuro-Re-ed  Air ex SLS 3x10 5sec hold  Side stepping airex 3x  Rock fwd bckwd 3x10  5/1  There-ex:  Scifit bike L3 x66min  Leg Press 110lbs 2x15 Knee extension machine 25# 2x15ea (cued to place more weight through R LE) Slide lunges x5 (stopped with RLE due to inability.) x10L    Nuero-re-ed: Air-ex: heel/toe rock 2x15 with light CGA Walking marching along rail x2laps Gait with head turns/nods in hall x 1lap each (light CGA) Sit to stand staggered (to challenge balance) x8, x5       PATIENT EDUCATION:  Education details: HEP, symptom management, exercise form, relevant anatomy, and POC Person educated: Patient Education method: Explanation, Demonstration, Tactile cues, Verbal cues, and Handouts Education comprehension: verbalized understanding  HOME EXERCISE PROGRAM: [id number]  ASSESSMENT:  CLINICAL IMPRESSION: Tolerated treatment well.  One of his main goals is to be able to get up and down off the ground independently.  We broke this down in the component parts today.  We worked on getting into quadruped position and then a half kneel position.  He needed upper extremity support to get to half kneel position.  We also worked on grading his leg press exercise.  Right now it is a strength issue coming from the half kneel position to a full stand position.  He was advised best way to improve this is to work on the leg press making sure that is graded right.  We also worked on sit to stand transfers.  He reported minor fatigue but overall had no major complaints with treatment.  We also worked on balance on uneven surface.  He required min assist with eyes closed.  Another goal of his to increase his cardiovascular endurance.  We reviewed how he can use the NuStep or exercise bike using intervals.  He will work on monitoring his heart rate.  He has a high baseline heart rate.  His  baseline heart rate runs around 95 he said.  His target heart rate max would be about 148 given his age.  He is advised at this time to keep his heart rate around potentially 115-120 tops to not approach his target heart rate max.  He worked on 1.5-minute intervals.  He is able to recover back to his baseline heart rate after about a minute and a half.   OBJECTIVE IMPAIRMENTS: Abnormal gait, decreased activity tolerance, decreased balance, decreased coordination, decreased endurance, decreased mobility, difficulty walking, decreased strength, and hypomobility.   ACTIVITY LIMITATIONS: carrying, lifting, bending, standing, squatting, stairs, bed mobility, bathing, toileting, and dressing  PARTICIPATION LIMITATIONS: cleaning, laundry, shopping, community activity, occupation, and yard work  PERSONAL FACTORS: 1-2 comorbidities: polyneuropathy are also affecting patient's functional outcome.   REHAB POTENTIAL: Good  CLINICAL DECISION MAKING: Evolving general decline in balance and endurance   EVALUATION COMPLEXITY: Mod  GOALS: Goals reviewed with patient? Yes  SHORT TERM GOALS: Target date: 06/02/2023   Pt will be able to complete standing feet together balance exercises with minimal sway and support for balance progression. Baseline: Goal status: IN PROGRESS 5/13  2.  Pt will increase LE strength 5 lbs from progress notes  Baseline:  Goal status: MET 5/13 and updated   3.  Therapy will perform Berg balance test next visit  Baseline:  Goal status: MET 5/13  4. Patient will increase BERG balance testing score by 5 points        LONG TERM GOALS: Target date: 06/30/2023   Pt will be able to ambulate community distances with no increase in symptoms for functional ADL movement. Baseline:  Goal status: IN PROGRESS 5/13  2.  Pt will increase bilateral LE strength by 30% for physical conditioning. Baseline:  Goal status: MET 5/13  3.  Pt will be report no falls while  reporting to PT for functional endurance.  Baseline:  Goal status: IN PROGRESS (no falls to date, but has had near falls) 5/13  4. Patient will get up and down off the floor without assistance in order to improve household safety   Goal status: New  PLAN:  PT FREQUENCY: 1-2x/week  PT DURATION: 8 weeks  PLANNED INTERVENTIONS: 97110-Therapeutic exercises, 97530- Therapeutic activity, [id number]- Neuromuscular re-education, 97535- Self Care, [id number]- Manual therapy, [id number]- Gait training, [phone]- Aquatic Therapy, and [phone]- Electrical stimulation (unattended)  PLAN FOR NEXT SESSION:  Cont with balance training, gait, LE strengthening (with awareness of recent gallbladder surgery), functional endurance, and functional mobility.  Berg balance assessment.    Therapist reviewed POC and agrees with plan going forward   Signa Drier PT DPT  Kitty Perkins, PT 06/19/2023 10:36 AM

## 2023-06-19 ENCOUNTER — Encounter (HOSPITAL_BASED_OUTPATIENT_CLINIC_OR_DEPARTMENT_OTHER): Payer: Self-pay | Admitting: Physical Therapy

## 2023-06-23 ENCOUNTER — Ambulatory Visit (HOSPITAL_BASED_OUTPATIENT_CLINIC_OR_DEPARTMENT_OTHER): Admitting: Physical Therapy

## 2023-06-23 ENCOUNTER — Encounter (HOSPITAL_BASED_OUTPATIENT_CLINIC_OR_DEPARTMENT_OTHER): Payer: Self-pay | Admitting: Physical Therapy

## 2023-06-23 DIAGNOSIS — M79604 Pain in right leg: Secondary | ICD-10-CM | POA: Diagnosis not present

## 2023-06-23 DIAGNOSIS — M6281 Muscle weakness (generalized): Secondary | ICD-10-CM | POA: Diagnosis not present

## 2023-06-23 DIAGNOSIS — M5459 Other low back pain: Secondary | ICD-10-CM | POA: Diagnosis not present

## 2023-06-23 DIAGNOSIS — M5416 Radiculopathy, lumbar region: Secondary | ICD-10-CM | POA: Diagnosis not present

## 2023-06-23 DIAGNOSIS — R262 Difficulty in walking, not elsewhere classified: Secondary | ICD-10-CM

## 2023-06-23 NOTE — Therapy (Signed)
 OUTPATIENT PHYSICAL THERAPY LOWER EXTREMITY TREATMENT Progress Note Reporting Period 05/05/2023 to 06/30/2023  See note below for Objective Data and Assessment of Progress/Goals.       Patient Name: Patrick Kidd MRN: [id number] DOB:11-24-1945, 78 y.o., male Today's Date: 06/24/2023  END OF SESSION:  PT End of Session - 06/23/23 1107     Visit Number 12    Number of Visits 16    Date for PT Re-Evaluation 06/30/23    Authorization Type MCR A&B    PT Start Time 1100    PT Stop Time 1142    PT Time Calculation (min) 42 min    Activity Tolerance Patient tolerated treatment well    Behavior During Therapy WFL for tasks assessed/performed                       Past Medical History:  Diagnosis Date   Acrophobia    Alcoholic cirrhosis (HCC) 10/04/2015   Anemia    Arthritis    foot by big toe   BPH associated with nocturia    Cataract    removed both eyes   Cholecystitis 05/2022   tx Abx   Chronic cough    PMH of   CLL (chronic lymphocytic leukemia) (HCC)    COVID-19 12/2018   Diabetes mellitus without complication (HCC)    Diverticulosis 07/03/2010   Colonoscopy.    Duodenal ulcer 2017   Fallen arches    Bilateral   GERD (gastroesophageal reflux disease)    Gout    Granuloma annulare    Hx of adenomatous colonic polyps multiple   Hydrocele 2011   Large septated right hydrocele   Liver cyst    Liver lesion    Nonspecific elevation of levels of transaminase or lactic acid dehydrogenase (LDH)    Obesity    Peripheral neuropathy    Plantar fasciitis    PMH of   Portal hypertension (HCC) 2017   Prostate cancer (HCC)    Right shoulder pain 11/2017   Sleep apnea    no cpap, patient denies   Thiamine  deficiency    Wears reading eyeglasses    WPW (Wolff-Parkinson-White syndrome) 02/05/2019   Past Surgical History:  Procedure Laterality Date   BACK SURGERY  04/28/2022   CATARACT EXTRACTION, BILATERAL  12/2011   Dr Gennie Kicks   COLONOSCOPY   2017   COLONOSCOPY W/ POLYPECTOMY  07/03/2010   2 adenomas, diverticulosis on right. Dr Willy Harvest   CYSTOSCOPY N/A 03/15/2018   Procedure: Ardith Bedford;  Surgeon: Samson Croak, MD;  Location: [facility];  Service: Urology;  Laterality: N/A;  NO SEEDS FOUND IN BLADDER   ESOPHAGOGASTRODUODENOSCOPY (EGD) WITH PROPOFOL  N/A 01/08/2016   Procedure: ESOPHAGOGASTRODUODENOSCOPY (EGD) WITH PROPOFOL ;  Surgeon: Asencion Blacksmith, MD;  Location: [facility];  Service: Endoscopy;  Laterality: N/A;   FLEXIBLE SIGMOIDOSCOPY  2000   IR EXCHANGE BILIARY DRAIN  02/17/2023   IR PATIENT EVAL TECH 0-60 MINS  12/26/2022   IR PERC CHOLECYSTOSTOMY  12/05/2022   IR PERC CHOLECYSTOSTOMY  12/09/2022   IR RADIOLOGIST EVAL & MGMT  01/16/2023   RADIOACTIVE SEED IMPLANT N/A 03/15/2018   Procedure: RADIOACTIVE SEED IMPLANT/BRACHYTHERAPY IMPLANT;  Surgeon: Samson Croak, MD;  Location: [facility];  Service: Urology;  Laterality: N/A;   103 SEEDS IMPLANTED   RADIOLOGY WITH ANESTHESIA N/A 12/09/2022   Procedure: IR WITH ANESTHESIA;  Surgeon: Radiologist, Medication, MD;  Location: [facility];  Service: Radiology;  Laterality: N/A;   SIGMOIDOSCOPY     SPACE OAR INSTILLATION N/A 03/15/2018   Procedure: SPACE OAR INSTILLATION;  Surgeon: Samson Croak, MD;  Location: [facility];  Service: Urology;  Laterality: N/A;   WISDOM TOOTH EXTRACTION     Patient Active Problem List   Diagnosis Date Noted   OSA (obstructive sleep apnea) 03/17/2023   Thrombocytopenia (HCC) 03/17/2023   Urinary incontinence 06/25/2021   Degenerative lumbar spinal stenosis 04/30/2021   Degenerative arthritis of knee, bilateral 03/27/2021   Chronic lymphocytic leukemia (HCC) 11/08/2020   Insulin -requiring or dependent type II diabetes mellitus (HCC) 09/25/2020   Thiamine  deficiency    Paroxysmal atrial fibrillation (HCC) 07/19/2019   Allergic rhinitis 06/09/2019   Erectile dysfunction due to  diabetes mellitus (HCC) 06/09/2019   Overweight with body mass index (BMI) of 28 to 28.9 in adult 03/17/2019   WPW (Wolff-Parkinson-White syndrome) 02/05/2019   Encephalopathy, hepatic (HCC)    Degenerative disc disease, cervical 07/21/2018   Cervical radiculopathy 07/16/2018   OAB (overactive bladder) 07/06/2018   Malignant neoplasm of prostate (HCC) 01/13/2018   Hyperlipidemia LDL goal <100 11/11/2017   Essential hypertension 11/11/2017   BPH associated with nocturia 11/11/2017   Erectile dysfunction due to arterial insufficiency 04/07/2017   Peripheral vascular disease (HCC) 06/25/2016   Alcoholic cirrhosis (HCC) 10/04/2015   Type 2 diabetes mellitus with complication, with long-term current use of insulin  (HCC) 08/22/2015   Hypersomnolence 03/19/2014   Peripheral neuropathy 01/11/2013   Polyclonal gammopathy 01/11/2013   GERD 10/04/2009   Benign prostatic hyperplasia without lower urinary tract symptoms 02/04/1999   Gout, unspecified 02/04/1999   Portal hypertension (HCC) 02/04/1999    PCP: [name], MD  REFERRING PROVIDER:  [name], MD/Henson, Vickie L, MD      REFERRING DIAG: R53.81 (ICD-10-CM) - Physical deconditioning   THERAPY DIAG:  Physical deconditioning  Rationale for Evaluation and Treatment: Rehabilitation  ONSET DATE: Pt has had a recent hospitalization and feels a decrease in endurance  SUBJECTIVE:   SUBJECTIVE STATEMENT: The patient was on a boat for 2 days over the weekend.  He reports he is very stiff and sore today.  PERTINENT HISTORY: Pt has a 30# lifting restriction for 6 weeks to 2 months per pt since having gallbladder surgery Peripheral neuropathy,GERD, Degenerative lumbar spinal stenosis, urinary incontinence, cervical radiculopathy, essential hypertension, peripheral vascular disease, portal hypertension, right shoulder pain, plantar fasciitis, obesity, prostate cancer, fallen arches, cataract, arthritis,  CLL (chronic  lymphocytic leukemia)  WPW (Wolff-Parkinson-White syndrome), Pt states it doesn't affect him  OA in bilat knees Lumbar decompression surgery on 04/27/2022   PAIN:  Are you having pain? Yes: NPRS scale: no pain just soreness in back and knees after the stability class Pain location: knees Pain description: dull Aggravating factors: walking Relieving factors: rest  PRECAUTIONS: Fall  RED FLAGS: None   WEIGHT BEARING RESTRICTIONS: No  FALLS:  Has patient fallen in last 6 months? Yes. Number of falls 1  LIVING ENVIRONMENT: One step  OCCUPATION: semi-working real Chief Technology Officer  PLOF: Independent  PATIENT GOALS: get legs stronger, more confident in balance  NEXT MD VISIT: nothing upcoming  OBJECTIVE:  Note: Objective measures were completed at Evaluation unless otherwise noted.  DIAGNOSTIC FINDINGS: Ultrasound evaluation prior to injection reveals mild joint effusion. Mild degeneration medial joint line.   PATIENT SURVEYS:  LEFS 41/80 5/13: 39/80  COGNITION: Overall cognitive status: Within functional limits for tasks assessed     SENSATION: Proprioception: Impaired  EDEMA:  No edema noted  MUSCLE LENGTH:   POSTURE: rounded shoulders, forward head, increased thoracic kyphosis, and flexed trunk   PALPATION: No tenderness to palpation noted  LOWER EXTREMITY ROM:  Passive ROM Right eval Left eval  Hip flexion [facility]  Hip extension    Hip abduction    Hip adduction    Hip internal rotation    Hip external rotation    Knee flexion [facility] WFL  Knee extension [facility]  Ankle dorsiflexion    Ankle plantarflexion    Ankle inversion    Ankle eversion     (Blank rows = not tested)  LOWER EXTREMITY MMT:  MMT Right eval Left eval Right 5/13 Left  5/13  Hip flexion 17.2 16.8 46.5 51.2  Hip extension      Hip abduction 21.9 22.3 47.7 41.8  Hip adduction      Hip internal rotation      Hip external rotation      Knee flexion      Knee extension  21.8 21.6 37.2 40.6  Ankle dorsiflexion      Ankle plantarflexion      Ankle inversion      Ankle eversion       (Blank rows = not tested)  LOWER EXTREMITY SPECIAL TESTS:    FUNCTIONAL TESTS:  BERG Balance Test          Date: 5/13  Sit to Stand 3  Standing unsupported 4  Sitting with back unsupported but feet supported 4  Stand to sit  4  Transfers    Standing unsupported with eyes closed 3  Standing unsupported feet together 4  From standing position, reach forward with outstretched arm 4  From standing position, pick up object from floor 3  From standing position, turn and look behind over each shoulder 4  Turn 360 2  Standing unsupported, alternately place foot on step 1  Standing unsupported, one foot in front 2  Standing on one leg 1  Total:  39/56     GAIT: Noticed decreased stride length, forward lean, increased thoracic kyphosis and relative femoral retroversion                                                                                                                                TREATMENT DATE:   5/20  There-ex:  Nu-step 5 min L5    There-act:  Sit to stand 2x10 from slightly elevated position   Hurdle side steps and forward step 2x12  Airex squat 3 x 10 deep position    Neuromuscular reeducation: Airex: Narrow base eyes open x 30 seconds Eyes closed 3 x 30 seconds with min assist for balance  Airex heel toe rock for posterior balance Multipoint cone touch 4x each leg     5/15 There-ex:   Nu-step: Reviewed how to use of r cardiovalvular endurance training.   1.5 min interval 4x at an 80 SPM  pace.   Narrow base eyes closed on air-ex 3x30   There-act:  Sit to stand 5x from lowest table position   Floor transfer training:  Prone to quadruped 3x3   UE support quadruped -> high kneel  3x   There-ex:  Leg press 3x12  80 lbs at and RPE of 6      5/13   Nu step lvl 5  Sit to stands 2x10   Randye Buttner  Updated MMT Goal  review LEFS     5/8  There-ex:  Nu step lvl 3  Sit to stands 2x10   Neuro-Re-ed   Walking marches x2 laps Lateral stepping on airex beam x4 laps Air ex SLS 3x10 5sec hold  Side stepping airex 3x  Rock fwd bckwd 3x10 Cone reaches 3 way at rail x10ea/bil March on airex 2x10 Step up on airex 2x10R LE       PATIENT EDUCATION:  Education details: HEP, symptom management, exercise form, relevant anatomy, and POC Person educated: Patient Education method: Explanation, Demonstration, Tactile cues, Verbal cues, and Handouts Education comprehension: verbalized understanding  HOME EXERCISE PROGRAM: [id number]  ASSESSMENT:  CLINICAL IMPRESSION: Right baseline soreness the patient tolerated treatment well.  Will continue to work on deep squatting OBJECTIVE IMPAIRMENTS: Abnormal gait, decreased activity tolerance, decreased balance, decreased coordination, decreased endurance, decreased mobility, difficulty walking, decreased strength, and hypomobility.   ACTIVITY LIMITATIONS: carrying, lifting, bending, standing, squatting, stairs, bed mobility, bathing, toileting, and dressing  PARTICIPATION LIMITATIONS: cleaning, laundry, shopping, community activity, occupation, and yard work  PERSONAL FACTORS: 1-2 comorbidities: polyneuropathy are also affecting patient's functional outcome.   REHAB POTENTIAL: Good  CLINICAL DECISION MAKING: Evolving general decline in balance and endurance   EVALUATION COMPLEXITY: Mod    GOALS: Goals reviewed with patient? Yes  SHORT TERM GOALS: Target date: 06/02/2023   Pt will be able to complete standing feet together balance exercises with minimal sway and support for balance progression. Baseline: Goal status: IN PROGRESS 5/13  2.  Pt will increase LE strength 5 lbs from progress notes  Baseline:  Goal status: MET 5/13 and updated   3.  Therapy will perform Berg balance test next visit  Baseline:  Goal status: MET 5/13  4.  Patient will increase BERG balance testing score by 5 points        LONG TERM GOALS: Target date: 06/30/2023   Pt will be able to ambulate community distances with no increase in symptoms for functional ADL movement. Baseline:  Goal status: IN PROGRESS 5/13  2.  Pt will increase bilateral LE strength by 30% for physical conditioning. Baseline:  Goal status: MET 5/13  3.  Pt will be report no falls while reporting to PT for functional endurance.  Baseline:  Goal status: IN PROGRESS (no falls to date, but has had near falls) 5/13  4. Patient will get up and down off the floor without assistance in order to improve household safety   Goal status: New  PLAN:  PT FREQUENCY: 1-2x/week  PT DURATION: 8 weeks  PLANNED INTERVENTIONS: 97110-Therapeutic exercises, 97530- Therapeutic activity, [id number]- Neuromuscular re-education, 97535- Self Care, [id number]- Manual therapy, [id number]- Gait training, [phone]- Aquatic Therapy, and [phone]- Electrical stimulation (unattended)  PLAN FOR NEXT SESSION:  Cont with balance training, gait, LE strengthening (with awareness of recent gallbladder surgery), functional endurance, and functional mobility.  Berg balance assessment.    Therapist reviewed POC and agrees with plan going forward   Signa Drier PT DPT  Kitty Perkins, PT  06/24/2023 9:15 AM

## 2023-06-24 ENCOUNTER — Encounter (HOSPITAL_BASED_OUTPATIENT_CLINIC_OR_DEPARTMENT_OTHER): Payer: Self-pay | Admitting: Physical Therapy

## 2023-06-24 ENCOUNTER — Telehealth: Payer: Self-pay | Admitting: Hematology and Oncology

## 2023-06-25 ENCOUNTER — Ambulatory Visit (HOSPITAL_BASED_OUTPATIENT_CLINIC_OR_DEPARTMENT_OTHER): Admitting: Physical Therapy

## 2023-06-25 DIAGNOSIS — R262 Difficulty in walking, not elsewhere classified: Secondary | ICD-10-CM

## 2023-06-25 DIAGNOSIS — M6281 Muscle weakness (generalized): Secondary | ICD-10-CM | POA: Diagnosis not present

## 2023-06-25 DIAGNOSIS — M79604 Pain in right leg: Secondary | ICD-10-CM

## 2023-06-25 DIAGNOSIS — M5459 Other low back pain: Secondary | ICD-10-CM

## 2023-06-25 DIAGNOSIS — M5416 Radiculopathy, lumbar region: Secondary | ICD-10-CM | POA: Diagnosis not present

## 2023-06-26 ENCOUNTER — Encounter (HOSPITAL_BASED_OUTPATIENT_CLINIC_OR_DEPARTMENT_OTHER): Payer: Self-pay | Admitting: Physical Therapy

## 2023-06-26 NOTE — Therapy (Signed)
 OUTPATIENT PHYSICAL THERAPY LOWER EXTREMITY TREATMENT Progress Note Reporting Period 05/05/2023 to 06/30/2023  See note below for Objective Data and Assessment of Progress/Goals.       Patient Name: Patrick Kidd MRN: [id number] DOB:1945-12-08, 78 y.o., male Today's Date: 06/26/2023  END OF SESSION:  PT End of Session - 06/26/23 0753     Visit Number 13    Number of Visits 26    Date for PT Re-Evaluation 08/21/23    Authorization Type MCR A&B    PT Start Time 1105   5 min late   PT Stop Time 1145    PT Time Calculation (min) 40 min    Activity Tolerance Patient tolerated treatment well    Behavior During Therapy [facility] for tasks assessed/performed                       Past Medical History:  Diagnosis Date   Acrophobia    Alcoholic cirrhosis (HCC) 10/04/2015   Anemia    Arthritis    foot by big toe   BPH associated with nocturia    Cataract    removed both eyes   Cholecystitis 05/2022   tx Abx   Chronic cough    PMH of   CLL (chronic lymphocytic leukemia) (HCC)    COVID-19 12/2018   Diabetes mellitus without complication (HCC)    Diverticulosis 07/03/2010   Colonoscopy.    Duodenal ulcer 2017   Fallen arches    Bilateral   GERD (gastroesophageal reflux disease)    Gout    Granuloma annulare    Hx of adenomatous colonic polyps multiple   Hydrocele 2011   Large septated right hydrocele   Liver cyst    Liver lesion    Nonspecific elevation of levels of transaminase or lactic acid dehydrogenase (LDH)    Obesity    Peripheral neuropathy    Plantar fasciitis    PMH of   Portal hypertension (HCC) 2017   Prostate cancer (HCC)    Right shoulder pain 11/2017   Sleep apnea    no cpap, patient denies   Thiamine  deficiency    Wears reading eyeglasses    WPW (Wolff-Parkinson-White syndrome) 02/05/2019   Past Surgical History:  Procedure Laterality Date   BACK SURGERY  04/28/2022   CATARACT EXTRACTION, BILATERAL  12/2011   Dr Gennie Kicks    COLONOSCOPY  2017   COLONOSCOPY W/ POLYPECTOMY  07/03/2010   2 adenomas, diverticulosis on right. Dr Willy Harvest   CYSTOSCOPY N/A 03/15/2018   Procedure: Ardith Bedford;  Surgeon: Samson Croak, MD;  Location: [facility];  Service: Urology;  Laterality: N/A;  NO SEEDS FOUND IN BLADDER   ESOPHAGOGASTRODUODENOSCOPY (EGD) WITH PROPOFOL  N/A 01/08/2016   Procedure: ESOPHAGOGASTRODUODENOSCOPY (EGD) WITH PROPOFOL ;  Surgeon: Asencion Blacksmith, MD;  Location: [facility];  Service: Endoscopy;  Laterality: N/A;   FLEXIBLE SIGMOIDOSCOPY  2000   IR EXCHANGE BILIARY DRAIN  02/17/2023   IR PATIENT EVAL TECH 0-60 MINS  12/26/2022   IR PERC CHOLECYSTOSTOMY  12/05/2022   IR PERC CHOLECYSTOSTOMY  12/09/2022   IR RADIOLOGIST EVAL & MGMT  01/16/2023   RADIOACTIVE SEED IMPLANT N/A 03/15/2018   Procedure: RADIOACTIVE SEED IMPLANT/BRACHYTHERAPY IMPLANT;  Surgeon: Samson Croak, MD;  Location: [facility];  Service: Urology;  Laterality: N/A;   73 SEEDS IMPLANTED   RADIOLOGY WITH ANESTHESIA N/A 12/09/2022   Procedure: IR WITH ANESTHESIA;  Surgeon: Radiologist, Medication, MD;  Location: [facility];  Service: Radiology;  Laterality: N/A;   SIGMOIDOSCOPY     SPACE OAR INSTILLATION N/A 03/15/2018   Procedure: SPACE OAR INSTILLATION;  Surgeon: Samson Croak, MD;  Location[facility];  Service: Urology;  Laterality: N/A;   WISDOM TOOTH EXTRACTION     Patient Active Problem List   Diagnosis Date Noted   OSA (obstructive sleep apnea) 03/17/2023   Thrombocytopenia (HCC) 03/17/2023   Urinary incontinence 06/25/2021   Degenerative lumbar spinal stenosis 04/30/2021   Degenerative arthritis of knee, bilateral 03/27/2021   Chronic lymphocytic leukemia (HCC) 11/08/2020   Insulin -requiring or dependent type II diabetes mellitus (HCC) 09/25/2020   Thiamine  deficiency    Paroxysmal atrial fibrillation (HCC) 07/19/2019   Allergic rhinitis 06/09/2019   Erectile  dysfunction due to diabetes mellitus (HCC) 06/09/2019   Overweight with body mass index (BMI) of 28 to 28.9 in adult 03/17/2019   WPW (Wolff-Parkinson-White syndrome) 02/05/2019   Encephalopathy, hepatic (HCC)    Degenerative disc disease, cervical 07/21/2018   Cervical radiculopathy 07/16/2018   OAB (overactive bladder) 07/06/2018   Malignant neoplasm of prostate (HCC) 01/13/2018   Hyperlipidemia LDL goal <100 11/11/2017   Essential hypertension 11/11/2017   BPH associated with nocturia 11/11/2017   Erectile dysfunction due to arterial insufficiency 04/07/2017   Peripheral vascular disease (HCC) 06/25/2016   Alcoholic cirrhosis (HCC) 10/04/2015   Type 2 diabetes mellitus with complication, with long-term current use of insulin  (HCC) 08/22/2015   Hypersomnolence 03/19/2014   Peripheral neuropathy 01/11/2013   Polyclonal gammopathy 01/11/2013   GERD 10/04/2009   Benign prostatic hyperplasia without lower urinary tract symptoms 02/04/1999   Gout, unspecified 02/04/1999   Portal hypertension (HCC) 02/04/1999    PCP: [name], MD  REFERRING PROVIDER:  [name], MD/Henson, Marcos Sevin, MD      REFERRING DIAG: R53.81 (ICD-10-CM) - Physical deconditioning   THERAPY DIAG:  Physical deconditioning  Rationale for Evaluation and Treatment: Rehabilitation  ONSET DATE: Pt has had a recent hospitalization and feels a decrease in endurance  SUBJECTIVE:   SUBJECTIVE STATEMENT: The patient continues to e stiff and sore in his knee   PERTINENT HISTORY: Pt has a 30# lifting restriction for 6 weeks to 2 months per pt since having gallbladder surgery Peripheral neuropathy,GERD, Degenerative lumbar spinal stenosis, urinary incontinence, cervical radiculopathy, essential hypertension, peripheral vascular disease, portal hypertension, right shoulder pain, plantar fasciitis, obesity, prostate cancer, fallen arches, cataract, arthritis,  CLL (chronic lymphocytic leukemia)  WPW  (Wolff-Parkinson-White syndrome), Pt states it doesn't affect him  OA in bilat knees Lumbar decompression surgery on 04/27/2022   PAIN:  Are you having pain? Yes: NPRS scale: no pain just soreness in back and knees after the stability class Pain location: knees Pain description: dull Aggravating factors: walking Relieving factors: rest  PRECAUTIONS: Fall  RED FLAGS: None   WEIGHT BEARING RESTRICTIONS: No  FALLS:  Has patient fallen in last 6 months? Yes. Number of falls 1  LIVING ENVIRONMENT: One step  OCCUPATION: semi-working real Chief Technology Officer  PLOF: Independent  PATIENT GOALS: get legs stronger, more confident in balance  NEXT MD VISIT: nothing upcoming  OBJECTIVE:  Note: Objective measures were completed at Evaluation unless otherwise noted.  DIAGNOSTIC FINDINGS: Ultrasound evaluation prior to injection reveals mild joint effusion. Mild degeneration medial joint line.   PATIENT SURVEYS:  LEFS 41/80 5/13: 39/80  COGNITION: Overall cognitive status: Within functional limits for tasks assessed     SENSATION: Proprioception: Impaired   EDEMA:  No edema noted  MUSCLE LENGTH:   POSTURE: rounded shoulders, forward head, increased thoracic kyphosis, and flexed trunk   PALPATION: No tenderness to palpation noted  LOWER EXTREMITY ROM:  Passive ROM Right eval Left eval  Hip flexion [facility]  Hip extension    Hip abduction    Hip adduction    Hip internal rotation    Hip external rotation    Knee flexion [facility] WFL  Knee extension [facility]  Ankle dorsiflexion    Ankle plantarflexion    Ankle inversion    Ankle eversion     (Blank rows = not tested)  LOWER EXTREMITY MMT:  MMT Right eval Left eval Right 5/13 Left  5/13  Hip flexion 17.2 16.8 46.5 51.2  Hip extension      Hip abduction 21.9 22.3 47.7 41.8  Hip adduction      Hip internal rotation      Hip external rotation      Knee flexion      Knee extension 21.8 21.6 37.2 40.6  Ankle  dorsiflexion      Ankle plantarflexion      Ankle inversion      Ankle eversion       (Blank rows = not tested)  LOWER EXTREMITY SPECIAL TESTS:    FUNCTIONAL TESTS:  BERG Balance Test          Date: 5/13  Sit to Stand 3  Standing unsupported 4  Sitting with back unsupported but feet supported 4  Stand to sit  4  Transfers    Standing unsupported with eyes closed 3  Standing unsupported feet together 4  From standing position, reach forward with outstretched arm 4  From standing position, pick up object from floor 3  From standing position, turn and look behind over each shoulder 4  Turn 360 2  Standing unsupported, alternately place foot on step 1  Standing unsupported, one foot in front 2  Standing on one leg 1  Total:  39/56     GAIT: Noticed decreased stride length, forward lean, increased thoracic kyphosis and relative femoral retroversion                                                                                                                                TREATMENT DATE:   5/22  There-ex:  Nu-step 5 min L5    There-act:  Sit to stand 2x10 from slightly elevated position ( lower table this visit )   Hurdle side steps and forward step 2x12    Neuromuscular reeducation: Airex: Narrow base eyes open x 30 seconds Eyes closed 3 x 30 seconds with min assist for balance  Airex heel toe rock for posterior balance Multipoint cone touch 4x each leg   Box step: x10 each direction    5/20  There-ex:  Nu-step 5 min L5    There-act:  Sit to stand 2x10 from slightly elevated position   Hurdle side steps  and forward step 2x12  Airex squat 3 x 10 deep position    Neuromuscular reeducation: Airex: Narrow base eyes open x 30 seconds Eyes closed 3 x 30 seconds with min assist for balance  Airex heel toe rock for posterior balance Multipoint cone touch 4x each leg     5/15 There-ex:   Nu-step: Reviewed how to use of r cardiovalvular  endurance training.   1.5 min interval 4x at an 80 SPM pace.   Narrow base eyes closed on air-ex 3x30   There-act:  Sit to stand 5x from lowest table position   Floor transfer training:  Prone to quadruped 3x3   UE support quadruped -> high kneel  3x   There-ex:  Leg press 3x12  80 lbs at and RPE of 6      5/13   Nu step lvl 5  Sit to stands 2x10   Randye Buttner  Updated MMT Goal review LEFS     5/8  There-ex:  Nu step lvl 3  Sit to stands 2x10   Neuro-Re-ed   Walking marches x2 laps Lateral stepping on airex beam x4 laps Air ex SLS 3x10 5sec hold  Side stepping airex 3x  Rock fwd bckwd 3x10 Cone reaches 3 way at rail x10ea/bil March on airex 2x10 Step up on airex 2x10R LE       PATIENT EDUCATION:  Education details: HEP, symptom management, exercise form, relevant anatomy, and POC Person educated: Patient Education method: Explanation, Demonstration, Tactile cues, Verbal cues, and Handouts Education comprehension: verbalized understanding  HOME EXERCISE PROGRAM: [id number]  ASSESSMENT:  CLINICAL IMPRESSION: Right baseline soreness the patient tolerated treatment well.  Will continue to work on deep squatting OBJECTIVE IMPAIRMENTS: Abnormal gait, decreased activity tolerance, decreased balance, decreased coordination, decreased endurance, decreased mobility, difficulty walking, decreased strength, and hypomobility.   ACTIVITY LIMITATIONS: carrying, lifting, bending, standing, squatting, stairs, bed mobility, bathing, toileting, and dressing  PARTICIPATION LIMITATIONS: cleaning, laundry, shopping, community activity, occupation, and yard work  PERSONAL FACTORS: 1-2 comorbidities: polyneuropathy are also affecting patient's functional outcome.   REHAB POTENTIAL: Good  CLINICAL DECISION MAKING: Evolving general decline in balance and endurance   EVALUATION COMPLEXITY: Mod    GOALS: Goals reviewed with patient? Yes  SHORT TERM GOALS:  Target date: 06/02/2023   Pt will be able to complete standing feet together balance exercises with minimal sway and support for balance progression. Baseline: Goal status: IN PROGRESS 5/13  2.  Pt will increase LE strength 5 lbs from progress notes  Baseline:  Goal status: MET 5/13 and updated   3.  Therapy will perform Berg balance test next visit  Baseline:  Goal status: MET 5/13  4. Patient will increase BERG balance testing score by 5 points        LONG TERM GOALS: Target date: 06/30/2023   Pt will be able to ambulate community distances with no increase in symptoms for functional ADL movement. Baseline:  Goal status: IN PROGRESS 5/13  2.  Pt will increase bilateral LE strength by 30% for physical conditioning. Baseline:  Goal status: MET 5/13  3.  Pt will be report no falls while reporting to PT for functional endurance.  Baseline:  Goal status: IN PROGRESS (no falls to date, but has had near falls) 5/13  4. Patient will get up and down off the floor without assistance in order to improve household safety   Goal status: New  PLAN:  PT FREQUENCY: 1-2x/week  PT DURATION: 8 weeks  PLANNED INTERVENTIONS: 97110-Therapeutic exercises, 97530- Therapeutic activity, [id number]- Neuromuscular re-education, [id number]- Self Care, [id number]- Manual therapy, [id number]- Gait training, [phone]- Aquatic Therapy, and [phone]- Electrical stimulation (unattended)  PLAN FOR NEXT SESSION:  Cont with balance training, gait, LE strengthening (with awareness of recent gallbladder surgery), functional endurance, and functional mobility.  Berg balance assessment.    Therapist reviewed POC and agrees with plan going forward   Signa Drier PT DPT  Kitty Perkins, PT 06/26/2023 7:57 AM

## 2023-06-27 ENCOUNTER — Other Ambulatory Visit: Payer: Self-pay | Admitting: Internal Medicine

## 2023-06-27 DIAGNOSIS — K219 Gastro-esophageal reflux disease without esophagitis: Secondary | ICD-10-CM

## 2023-06-29 ENCOUNTER — Other Ambulatory Visit: Payer: Self-pay | Admitting: Internal Medicine

## 2023-06-29 DIAGNOSIS — E119 Type 2 diabetes mellitus without complications: Secondary | ICD-10-CM

## 2023-06-30 ENCOUNTER — Encounter (HOSPITAL_BASED_OUTPATIENT_CLINIC_OR_DEPARTMENT_OTHER): Payer: Self-pay | Admitting: Physical Therapy

## 2023-06-30 ENCOUNTER — Ambulatory Visit (HOSPITAL_BASED_OUTPATIENT_CLINIC_OR_DEPARTMENT_OTHER): Admitting: Physical Therapy

## 2023-06-30 DIAGNOSIS — M5459 Other low back pain: Secondary | ICD-10-CM

## 2023-06-30 DIAGNOSIS — M6281 Muscle weakness (generalized): Secondary | ICD-10-CM | POA: Diagnosis not present

## 2023-06-30 DIAGNOSIS — R262 Difficulty in walking, not elsewhere classified: Secondary | ICD-10-CM | POA: Diagnosis not present

## 2023-06-30 DIAGNOSIS — M79604 Pain in right leg: Secondary | ICD-10-CM

## 2023-06-30 DIAGNOSIS — M5416 Radiculopathy, lumbar region: Secondary | ICD-10-CM | POA: Diagnosis not present

## 2023-06-30 MED ORDER — BD PEN NEEDLE MINI ULTRAFINE 31G X 5 MM MISC: 1 refills | Status: DC

## 2023-06-30 NOTE — Therapy (Signed)
 OUTPATIENT PHYSICAL THERAPY LOWER EXTREMITY TREATMENT Progress Note Reporting Period 05/05/2023 to 06/30/2023  See note below for Objective Data and Assessment of Progress/Goals.       Patient Name: Patrick Kidd MRN: [id number] DOB:11-Jul-1945, 78 y.o., male Today's Date: 06/30/2023  END OF SESSION:  PT End of Session - 06/30/23 1137     Visit Number 14    Number of Visits 26    Date for PT Re-Evaluation 08/21/23    Authorization Type MCR A&B    PT Start Time 1115   Patient 15 min late   PT Stop Time 1145    PT Time Calculation (min) 30 min    Activity Tolerance Patient tolerated treatment well    Behavior During Therapy [facility] for tasks assessed/performed                        Past Medical History:  Diagnosis Date   Acrophobia    Alcoholic cirrhosis (HCC) 10/04/2015   Anemia    Arthritis    foot by big toe   BPH associated with nocturia    Cataract    removed both eyes   Cholecystitis 05/2022   tx Abx   Chronic cough    PMH of   CLL (chronic lymphocytic leukemia) (HCC)    COVID-19 12/2018   Diabetes mellitus without complication (HCC)    Diverticulosis 07/03/2010   Colonoscopy.    Duodenal ulcer 2017   Fallen arches    Bilateral   GERD (gastroesophageal reflux disease)    Gout    Granuloma annulare    Hx of adenomatous colonic polyps multiple   Hydrocele 2011   Large septated right hydrocele   Liver cyst    Liver lesion    Nonspecific elevation of levels of transaminase or lactic acid dehydrogenase (LDH)    Obesity    Peripheral neuropathy    Plantar fasciitis    PMH of   Portal hypertension (HCC) 2017   Prostate cancer (HCC)    Right shoulder pain 11/2017   Sleep apnea    no cpap, patient denies   Thiamine  deficiency    Wears reading eyeglasses    WPW (Wolff-Parkinson-White syndrome) 02/05/2019   Past Surgical History:  Procedure Laterality Date   BACK SURGERY  04/28/2022   CATARACT EXTRACTION, BILATERAL  12/2011   Dr  Gennie Kicks   COLONOSCOPY  2017   COLONOSCOPY W/ POLYPECTOMY  07/03/2010   2 adenomas, diverticulosis on right. Dr Willy Harvest   CYSTOSCOPY N/A 03/15/2018   Procedure: Ardith Bedford;  Surgeon: Samson Croak, MD;  Location: [facility];  Service: Urology;  Laterality: N/A;  NO SEEDS FOUND IN BLADDER   ESOPHAGOGASTRODUODENOSCOPY (EGD) WITH PROPOFOL  N/A 01/08/2016   Procedure: ESOPHAGOGASTRODUODENOSCOPY (EGD) WITH PROPOFOL ;  Surgeon: Asencion Blacksmith, MD;  Location: [facility];  Service: Endoscopy;  Laterality: N/A;   FLEXIBLE SIGMOIDOSCOPY  2000   IR EXCHANGE BILIARY DRAIN  02/17/2023   IR PATIENT EVAL TECH 0-60 MINS  12/26/2022   IR PERC CHOLECYSTOSTOMY  12/05/2022   IR PERC CHOLECYSTOSTOMY  12/09/2022   IR RADIOLOGIST EVAL & MGMT  01/16/2023   RADIOACTIVE SEED IMPLANT N/A 03/15/2018   Procedure: RADIOACTIVE SEED IMPLANT/BRACHYTHERAPY IMPLANT;  Surgeon: Samson Croak, MD;  Location: [facility];  Service: Urology;  Laterality: N/A;   63 SEEDS IMPLANTED   RADIOLOGY WITH ANESTHESIA N/A 12/09/2022   Procedure: IR WITH ANESTHESIA;  Surgeon: Radiologist, Medication, MD;  Location:  [facility];  Service: Radiology;  Laterality: N/A;   SIGMOIDOSCOPY     SPACE OAR INSTILLATION N/A 03/15/2018   Procedure: SPACE OAR INSTILLATION;  Surgeon: Samson Croak, MD;  Location: [facility];  Service: Urology;  Laterality: N/A;   WISDOM TOOTH EXTRACTION     Patient Active Problem List   Diagnosis Date Noted   OSA (obstructive sleep apnea) 03/17/2023   Thrombocytopenia (HCC) 03/17/2023   Urinary incontinence 06/25/2021   Degenerative lumbar spinal stenosis 04/30/2021   Degenerative arthritis of knee, bilateral 03/27/2021   Chronic lymphocytic leukemia (HCC) 11/08/2020   Insulin -requiring or dependent type II diabetes mellitus (HCC) 09/25/2020   Thiamine  deficiency    Paroxysmal atrial fibrillation (HCC) 07/19/2019   Allergic rhinitis 06/09/2019   Erectile  dysfunction due to diabetes mellitus (HCC) 06/09/2019   Overweight with body mass index (BMI) of 28 to 28.9 in adult 03/17/2019   WPW (Wolff-Parkinson-White syndrome) 02/05/2019   Encephalopathy, hepatic (HCC)    Degenerative disc disease, cervical 07/21/2018   Cervical radiculopathy 07/16/2018   OAB (overactive bladder) 07/06/2018   Malignant neoplasm of prostate (HCC) 01/13/2018   Hyperlipidemia LDL goal <100 11/11/2017   Essential hypertension 11/11/2017   BPH associated with nocturia 11/11/2017   Erectile dysfunction due to arterial insufficiency 04/07/2017   Peripheral vascular disease (HCC) 06/25/2016   Alcoholic cirrhosis (HCC) 10/04/2015   Type 2 diabetes mellitus with complication, with long-term current use of insulin  (HCC) 08/22/2015   Hypersomnolence 03/19/2014   Peripheral neuropathy 01/11/2013   Polyclonal gammopathy 01/11/2013   GERD 10/04/2009   Benign prostatic hyperplasia without lower urinary tract symptoms 02/04/1999   Gout, unspecified 02/04/1999   Portal hypertension (HCC) 02/04/1999    PCP: [name], MD  REFERRING PROVIDER:  [name], MD/Henson, Vickie L, MD      REFERRING DIAG: R53.81 (ICD-10-CM) - Physical deconditioning   THERAPY DIAG:  Physical deconditioning  Rationale for Evaluation and Treatment: Rehabilitation  ONSET DATE: Pt has had a recent hospitalization and feels a decrease in endurance  SUBJECTIVE:   SUBJECTIVE STATEMENT: The patient had a fall on Sunday night. He reports his back is really sore. It has been sore the past few days. He has not seen a MD yet.   PERTINENT HISTORY: Pt has a 30# lifting restriction for 6 weeks to 2 months per pt since having gallbladder surgery Peripheral neuropathy,GERD, Degenerative lumbar spinal stenosis, urinary incontinence, cervical radiculopathy, essential hypertension, peripheral vascular disease, portal hypertension, right shoulder pain, plantar fasciitis, obesity, prostate cancer,  fallen arches, cataract, arthritis,  CLL (chronic lymphocytic leukemia)  WPW (Wolff-Parkinson-White syndrome), Pt states it doesn't affect him  OA in bilat knees Lumbar decompression surgery on 04/27/2022   PAIN:  Are you having pain? Yes: NPRS scale: no pain just soreness in back and knees after the stability class Pain location: knees Pain description: dull Aggravating factors: walking Relieving factors: rest  PRECAUTIONS: Fall  RED FLAGS: None   WEIGHT BEARING RESTRICTIONS: No  FALLS:  Has patient fallen in last 6 months? Yes. Number of falls 1  LIVING ENVIRONMENT: One step  OCCUPATION: semi-working real [employer]  PLOF: Independent  PATIENT GOALS: get legs stronger, more confident in balance  NEXT MD VISIT: nothing upcoming  OBJECTIVE:  Note: Objective measures were completed at Evaluation unless otherwise noted.  DIAGNOSTIC FINDINGS: Ultrasound evaluation prior to injection reveals mild joint effusion. Mild degeneration medial joint line.   PATIENT SURVEYS:  LEFS 41/80 5/13: 39/80  COGNITION: Overall cognitive  status: Within functional limits for tasks assessed     SENSATION: Proprioception: Impaired   EDEMA:  No edema noted  MUSCLE LENGTH:   POSTURE: rounded shoulders, forward head, increased thoracic kyphosis, and flexed trunk   PALPATION: No tenderness to palpation noted  LOWER EXTREMITY ROM:  Passive ROM Right eval Left eval  Hip flexion [facility]  Hip extension    Hip abduction    Hip adduction    Hip internal rotation    Hip external rotation    Knee flexion [facility] WFL  Knee extension [facility]  Ankle dorsiflexion    Ankle plantarflexion    Ankle inversion    Ankle eversion     (Blank rows = not tested)  LOWER EXTREMITY MMT:  MMT Right eval Left eval Right 5/13 Left  5/13  Hip flexion 17.2 16.8 46.5 51.2  Hip extension      Hip abduction 21.9 22.3 47.7 41.8  Hip adduction      Hip internal rotation      Hip external  rotation      Knee flexion      Knee extension 21.8 21.6 37.2 40.6  Ankle dorsiflexion      Ankle plantarflexion      Ankle inversion      Ankle eversion       (Blank rows = not tested)  LOWER EXTREMITY SPECIAL TESTS:    FUNCTIONAL TESTS:  BERG Balance Test          Date: 5/13  Sit to Stand 3  Standing unsupported 4  Sitting with back unsupported but feet supported 4  Stand to sit  4  Transfers    Standing unsupported with eyes closed 3  Standing unsupported feet together 4  From standing position, reach forward with outstretched arm 4  From standing position, pick up object from floor 3  From standing position, turn and look behind over each shoulder 4  Turn 360 2  Standing unsupported, alternately place foot on step 1  Standing unsupported, one foot in front 2  Standing on one leg 1  Total:  39/56     GAIT: Noticed decreased stride length, forward lean, increased thoracic kyphosis and relative femoral retroversion                                                                                                                                TREATMENT DATE:   5/27  Manual:  Trigger point release to lumbar paraspinals and gluteals in side lying   Reviewed self soft tissue mobilization using thera-cane and tennis ball   There--ex  Ball roll out x10 5 sec hold  Lateral roll out x10 5 sec hold to each side   Neuro-re-ed: Ball press and breathe 3x10    5/22  There-ex:  Nu-step 5 min L5    There-act:  Sit to stand 2x10 from slightly elevated position ( lower table this visit )  Hurdle side steps and forward step 2x12    Neuromuscular reeducation: Airex: Narrow base eyes open x 30 seconds Eyes closed 3 x 30 seconds with min assist for balance  Airex heel toe rock for posterior balance Multipoint cone touch 4x each leg   Box step: x10 each direction    5/20  There-ex:  Nu-step 5 min L5    There-act:  Sit to stand 2x10 from slightly elevated  position   Hurdle side steps and forward step 2x12  Airex squat 3 x 10 deep position    Neuromuscular reeducation: Airex: Narrow base eyes open x 30 seconds Eyes closed 3 x 30 seconds with min assist for balance  Airex heel toe rock for posterior balance Multipoint cone touch 4x each leg     5/15 There-ex:   Nu-step: Reviewed how to use of r cardiovalvular endurance training.   1.5 min interval 4x at an 80 SPM pace.   Narrow base eyes closed on air-ex 3x30   There-act:  Sit to stand 5x from lowest table position   Floor transfer training:  Prone to quadruped 3x3   UE support quadruped -> high kneel  3x   There-ex:  Leg press 3x12  80 lbs at and RPE of 6      5/13   Nu step lvl 5  Sit to stands 2x10   Randye Buttner  Updated MMT Goal review LEFS     5/8  There-ex:  Nu step lvl 3  Sit to stands 2x10   Neuro-Re-ed   Walking marches x2 laps Lateral stepping on airex beam x4 laps Air ex SLS 3x10 5sec hold  Side stepping airex 3x  Rock fwd bckwd 3x10 Cone reaches 3 way at rail x10ea/bil March on airex 2x10 Step up on airex 2x10R LE       PATIENT EDUCATION:  Education details: HEP, symptom management, exercise form, relevant anatomy, and POC Person educated: Patient Education method: Explanation, Demonstration, Tactile cues, Verbal cues, and Handouts Education comprehension: verbalized understanding  HOME EXERCISE PROGRAM: [id number]  ASSESSMENT:  CLINICAL IMPRESSION:  The patient had significant spasming in his back today. Therapy performed manual therapy. He reported improved pain following manual therapy. We reviewed self soft tissue mobilization. We also reviewed a flexion stretch that he can do at home. He was advised he can do the nu-step today to loosen his back up. He was advised if his back pain increases he should be seen by a MD. He agrees. He reports it is better then we he came in.   OBJECTIVE IMPAIRMENTS: Abnormal gait,  decreased activity tolerance, decreased balance, decreased coordination, decreased endurance, decreased mobility, difficulty walking, decreased strength, and hypomobility.   ACTIVITY LIMITATIONS: carrying, lifting, bending, standing, squatting, stairs, bed mobility, bathing, toileting, and dressing  PARTICIPATION LIMITATIONS: cleaning, laundry, shopping, community activity, occupation, and yard work  PERSONAL FACTORS: 1-2 comorbidities: polyneuropathy are also affecting patient's functional outcome.   REHAB POTENTIAL: Good  CLINICAL DECISION MAKING: Evolving general decline in balance and endurance   EVALUATION COMPLEXITY: Mod    GOALS: Goals reviewed with patient? Yes  SHORT TERM GOALS: Target date: 06/02/2023   Pt will be able to complete standing feet together balance exercises with minimal sway and support for balance progression. Baseline: Goal status: IN PROGRESS 5/13  2.  Pt will increase LE strength 5 lbs from progress notes  Baseline:  Goal status: MET 5/13 and updated   3.  Therapy will perform Berg balance test next visit  Baseline:  Goal status: MET 5/13  4. Patient will increase BERG balance testing score by 5 points        LONG TERM GOALS: Target date: 06/30/2023   Pt will be able to ambulate community distances with no increase in symptoms for functional ADL movement. Baseline:  Goal status: IN PROGRESS 5/13  2.  Pt will increase bilateral LE strength by 30% for physical conditioning. Baseline:  Goal status: MET 5/13  3.  Pt will be report no falls while reporting to PT for functional endurance.  Baseline:  Goal status: IN PROGRESS (no falls to date, but has had near falls) 5/13  4. Patient will get up and down off the floor without assistance in order to improve household safety   Goal status: New  PLAN:  PT FREQUENCY: 1-2x/week  PT DURATION: 8 weeks  PLANNED INTERVENTIONS: 97110-Therapeutic exercises, 97530- Therapeutic activity, [id number]-  Neuromuscular re-education, 97535- Self Care, 56213- Manual therapy, [id number]- Gait training, [phone]- Aquatic Therapy, and [phone]- Electrical stimulation (unattended)  PLAN FOR NEXT SESSION:  Cont with balance training, gait, LE strengthening (with awareness of recent gallbladder surgery), functional endurance, and functional mobility.  Berg balance assessment.    Therapist reviewed POC and agrees with plan going forward   Signa Drier PT DPT  Kitty Perkins, PT 06/30/2023 12:48 PM

## 2023-06-30 NOTE — Addendum Note (Signed)
 Addended by: Darcy Barbara E on: 06/30/2023 03:22 PM   Modules accepted: Orders

## 2023-07-02 ENCOUNTER — Encounter (HOSPITAL_BASED_OUTPATIENT_CLINIC_OR_DEPARTMENT_OTHER)

## 2023-07-03 ENCOUNTER — Encounter (HOSPITAL_BASED_OUTPATIENT_CLINIC_OR_DEPARTMENT_OTHER): Payer: Self-pay

## 2023-07-03 ENCOUNTER — Ambulatory Visit (HOSPITAL_BASED_OUTPATIENT_CLINIC_OR_DEPARTMENT_OTHER)

## 2023-07-03 DIAGNOSIS — R262 Difficulty in walking, not elsewhere classified: Secondary | ICD-10-CM | POA: Diagnosis not present

## 2023-07-03 DIAGNOSIS — M5416 Radiculopathy, lumbar region: Secondary | ICD-10-CM | POA: Diagnosis not present

## 2023-07-03 DIAGNOSIS — M6281 Muscle weakness (generalized): Secondary | ICD-10-CM

## 2023-07-03 DIAGNOSIS — M79604 Pain in right leg: Secondary | ICD-10-CM

## 2023-07-03 DIAGNOSIS — M5459 Other low back pain: Secondary | ICD-10-CM

## 2023-07-03 NOTE — Therapy (Signed)
 OUTPATIENT PHYSICAL THERAPY LOWER EXTREMITY TREATMENT Progress Note Reporting Period 05/05/2023 to 06/30/2023  See note below for Objective Data and Assessment of Progress/Goals.       Patient Name: Patrick Kidd MRN: [id number] DOB:[birth year], 78 y.o., male Today's Date: 07/03/2023  END OF SESSION:  PT End of Session - 07/03/23 1108     Visit Number 15    Number of Visits 26    Date for PT Re-Evaluation 08/21/23    Authorization Type MCR A&B    PT Start Time 1103    PT Stop Time 1142    PT Time Calculation (min) 39 min    Activity Tolerance Patient tolerated treatment well    Behavior During Therapy WFL for tasks assessed/performed                         Past Medical History:  Diagnosis Date   Acrophobia    Alcoholic cirrhosis (HCC) 10/04/2015   Anemia    Arthritis    foot by big toe   BPH associated with nocturia    Cataract    removed both eyes   Cholecystitis 05/2022   tx Abx   Chronic cough    PMH of   CLL (chronic lymphocytic leukemia) (HCC)    COVID-19 12/2018   Diabetes mellitus without complication (HCC)    Diverticulosis 07/03/2010   Colonoscopy.    Duodenal ulcer 2017   Fallen arches    Bilateral   GERD (gastroesophageal reflux disease)    Gout    Granuloma annulare    Hx of adenomatous colonic polyps multiple   Hydrocele 2011   Large septated right hydrocele   Liver cyst    Liver lesion    Nonspecific elevation of levels of transaminase or lactic acid dehydrogenase (LDH)    Obesity    Peripheral neuropathy    Plantar fasciitis    PMH of   Portal hypertension (HCC) 2017   Prostate cancer (HCC)    Right shoulder pain 11/2017   Sleep apnea    no cpap, patient denies   Thiamine  deficiency    Wears reading eyeglasses    WPW (Wolff-Parkinson-White syndrome) 02/05/2019   Past Surgical History:  Procedure Laterality Date   BACK SURGERY  04/28/2022   CATARACT EXTRACTION, BILATERAL  12/2011   Dr Gennie Kicks   COLONOSCOPY   2017   COLONOSCOPY W/ POLYPECTOMY  07/03/2010   2 adenomas, diverticulosis on right. Dr Willy Harvest   CYSTOSCOPY N/A 03/15/2018   Procedure: Ardith Bedford;  Surgeon: Samson Croak, MD;  Location: [facility];  Service: Urology;  Laterality: N/A;  NO SEEDS FOUND IN BLADDER   ESOPHAGOGASTRODUODENOSCOPY (EGD) WITH PROPOFOL  N/A 01/08/2016   Procedure: ESOPHAGOGASTRODUODENOSCOPY (EGD) WITH PROPOFOL ;  Surgeon: Asencion Blacksmith, MD;  Location: [facility];  Service: Endoscopy;  Laterality: N/A;   FLEXIBLE SIGMOIDOSCOPY  2000   IR EXCHANGE BILIARY DRAIN  02/17/2023   IR PATIENT EVAL TECH 0-60 MINS  12/26/2022   IR PERC CHOLECYSTOSTOMY  12/05/2022   IR PERC CHOLECYSTOSTOMY  12/09/2022   IR RADIOLOGIST EVAL & MGMT  01/16/2023   RADIOACTIVE SEED IMPLANT N/A 03/15/2018   Procedure: RADIOACTIVE SEED IMPLANT/BRACHYTHERAPY IMPLANT;  Surgeon: Samson Croak, MD;  Location: [facility] [place];  Service: Urology;  Laterality: N/A;   68 SEEDS IMPLANTED   RADIOLOGY WITH ANESTHESIA N/A 12/09/2022   Procedure: IR WITH ANESTHESIA;  Surgeon: Radiologist, Medication, MD;  Location: [facility];  Service:  Radiology;  Laterality: N/A;   SIGMOIDOSCOPY     SPACE OAR INSTILLATION N/A 03/15/2018   Procedure: SPACE OAR INSTILLATION;  Surgeon: Samson Croak, MD;  Location: [facility];  Service: Urology;  Laterality: N/A;   WISDOM TOOTH EXTRACTION     Patient Active Problem List   Diagnosis Date Noted   OSA (obstructive sleep apnea) 03/17/2023   Thrombocytopenia (HCC) 03/17/2023   Urinary incontinence 06/25/2021   Degenerative lumbar spinal stenosis 04/30/2021   Degenerative arthritis of knee, bilateral 03/27/2021   Chronic lymphocytic leukemia (HCC) 11/08/2020   Insulin -requiring or dependent type II diabetes mellitus (HCC) 09/25/2020   Thiamine  deficiency    Paroxysmal atrial fibrillation (HCC) 07/19/2019   Allergic rhinitis 06/09/2019   Erectile dysfunction due to  diabetes mellitus (HCC) 06/09/2019   Overweight with body mass index (BMI) of 28 to 28.9 in adult 03/17/2019   WPW (Wolff-Parkinson-White syndrome) 02/05/2019   Encephalopathy, hepatic (HCC)    Degenerative disc disease, cervical 07/21/2018   Cervical radiculopathy 07/16/2018   OAB (overactive bladder) 07/06/2018   Malignant neoplasm of prostate (HCC) 01/13/2018   Hyperlipidemia LDL goal <100 11/11/2017   Essential hypertension 11/11/2017   BPH associated with nocturia 11/11/2017   Erectile dysfunction due to arterial insufficiency 04/07/2017   Peripheral vascular disease (HCC) 06/25/2016   Alcoholic cirrhosis (HCC) 10/04/2015   Type 2 diabetes mellitus with complication, with long-term current use of insulin  (HCC) 08/22/2015   Hypersomnolence 03/19/2014   Peripheral neuropathy 01/11/2013   Polyclonal gammopathy 01/11/2013   GERD 10/04/2009   Benign prostatic hyperplasia without lower urinary tract symptoms 02/04/1999   Gout, unspecified 02/04/1999   Portal hypertension (HCC) 02/04/1999    PCP: [name], MD  REFERRING PROVIDER:  [name], MD/Henson, Marcos Sevin, MD      REFERRING DIAG: R53.81 (ICD-10-CM) - Physical deconditioning   THERAPY DIAG:  Physical deconditioning  Rationale for Evaluation and Treatment: Rehabilitation  ONSET DATE: Pt has had a recent hospitalization and feels a decrease in endurance  SUBJECTIVE:   SUBJECTIVE STATEMENT: Pt reports back is better, but still bothering him. 4-5/10 pain level in back. Leg has not been giving out on him. He has been using walker inside the house, no AD outside the house.   PERTINENT HISTORY: Pt has a 30# lifting restriction for 6 weeks to 2 months per pt since having gallbladder surgery Peripheral neuropathy,GERD, Degenerative lumbar spinal stenosis, urinary incontinence, cervical radiculopathy, essential hypertension, peripheral vascular disease, portal hypertension, right shoulder pain, plantar fasciitis,  obesity, prostate cancer, fallen arches, cataract, arthritis,  CLL (chronic lymphocytic leukemia)  WPW (Wolff-Parkinson-White syndrome), Pt states it doesn't affect him  OA in bilat knees Lumbar decompression surgery on 04/27/2022   PAIN:  Are you having pain? Yes: NPRS scale: no pain just soreness in back and knees after the stability class Pain location: knees Pain description: dull Aggravating factors: walking Relieving factors: rest  PRECAUTIONS: Fall  RED FLAGS: None   WEIGHT BEARING RESTRICTIONS: No  FALLS:  Has patient fallen in last 6 months? Yes. Number of falls 1  LIVING ENVIRONMENT: One step  OCCUPATION: semi-working real Chief Technology Officer  PLOF: Independent  PATIENT GOALS: get legs stronger, more confident in balance  NEXT MD VISIT: nothing upcoming  OBJECTIVE:  Note: Objective measures were completed at Evaluation unless otherwise noted.  DIAGNOSTIC FINDINGS: Ultrasound evaluation prior to injection reveals mild joint effusion. Mild degeneration medial joint line.   PATIENT SURVEYS:  LEFS 41/80 5/13: 39/80  COGNITION: Overall  cognitive status: Within functional limits for tasks assessed     SENSATION: Proprioception: Impaired   EDEMA:  No edema noted  MUSCLE LENGTH:   POSTURE: rounded shoulders, forward head, increased thoracic kyphosis, and flexed trunk   PALPATION: No tenderness to palpation noted  LOWER EXTREMITY ROM:  Passive ROM Right eval Left eval  Hip flexion [facility]  Hip extension    Hip abduction    Hip adduction    Hip internal rotation    Hip external rotation    Knee flexion [facility] WFL  Knee extension [facility]  Ankle dorsiflexion    Ankle plantarflexion    Ankle inversion    Ankle eversion     (Blank rows = not tested)  LOWER EXTREMITY MMT:  MMT Right eval Left eval Right 5/13 Left  5/13  Hip flexion 17.2 16.8 46.5 51.2  Hip extension      Hip abduction 21.9 22.3 47.7 41.8  Hip adduction      Hip internal  rotation      Hip external rotation      Knee flexion      Knee extension 21.8 21.6 37.2 40.6  Ankle dorsiflexion      Ankle plantarflexion      Ankle inversion      Ankle eversion       (Blank rows = not tested)  LOWER EXTREMITY SPECIAL TESTS:    FUNCTIONAL TESTS:  BERG Balance Test          Date: 5/13  Sit to Stand 3  Standing unsupported 4  Sitting with back unsupported but feet supported 4  Stand to sit  4  Transfers    Standing unsupported with eyes closed 3  Standing unsupported feet together 4  From standing position, reach forward with outstretched arm 4  From standing position, pick up object from floor 3  From standing position, turn and look behind over each shoulder 4  Turn 360 2  Standing unsupported, alternately place foot on step 1  Standing unsupported, one foot in front 2  Standing on one leg 1  Total:  39/56     GAIT: Noticed decreased stride length, forward lean, increased thoracic kyphosis and relative femoral retroversion                                                                                                                                TREATMENT DATE:    5/30 Nu-step x61min L5  Seated lumbar stretching (no stretch felt, so stopped) Standing HR/TR 2x15  There-act:  Sit to stand 2x10 from  elevated position  Step ups 6" 2x10 (switched to back of bike for better posture when holding on) Hurdle step overs 3 hurdles lateral x4laps , forward reciprocal x2 laps (some SIJ pain)   Neuromuscular reeducation:  Walking marches along rail x2 laps Sidestepping at rail x2laps    5/27  Manual:  Trigger point release to lumbar paraspinals  and gluteals in side lying   Reviewed self soft tissue mobilization using thera-cane and tennis ball   There--ex  Ball roll out x10 5 sec hold  Lateral roll out x10 5 sec hold to each side   Neuro-re-ed: Ball press and breathe 3x10    5/22  There-ex:  Nu-step 5 min L5    There-act:  Sit  to stand 2x10 from slightly elevated position ( lower table this visit )   Hurdle side steps and forward step 2x12    Neuromuscular reeducation: Airex: Narrow base eyes open x 30 seconds Eyes closed 3 x 30 seconds with min assist for balance  Airex heel toe rock for posterior balance Multipoint cone touch 4x each leg   Box step: x10 each direction    5/20  There-ex:  Nu-step 5 min L5    There-act:  Sit to stand 2x10 from slightly elevated position   Hurdle side steps and forward step 2x12  Airex squat 3 x 10 deep position    Neuromuscular reeducation: Airex: Narrow base eyes open x 30 seconds Eyes closed 3 x 30 seconds with min assist for balance  Airex heel toe rock for posterior balance Multipoint cone touch 4x each leg      PATIENT EDUCATION:  Education details: HEP, symptom management, exercise form, relevant anatomy, and POC Person educated: Patient Education method: Explanation, Demonstration, Tactile cues, Verbal cues, and Handouts Education comprehension: verbalized understanding  HOME EXERCISE PROGRAM: [id number]  ASSESSMENT:  CLINICAL IMPRESSION:  Pt had improved tolerance for exercise today. Denied back pain with most tasks. Did report catch in SIJ area with fwd hurdle step overs, so discontinued this. Pt continues to demonstrate unsteadiness with ambulation and standing tasks. Will continue to work on balance and strength deficits in clinic.   OBJECTIVE IMPAIRMENTS: Abnormal gait, decreased activity tolerance, decreased balance, decreased coordination, decreased endurance, decreased mobility, difficulty walking, decreased strength, and hypomobility.   ACTIVITY LIMITATIONS: carrying, lifting, bending, standing, squatting, stairs, bed mobility, bathing, toileting, and dressing  PARTICIPATION LIMITATIONS: cleaning, laundry, shopping, community activity, occupation, and yard work  PERSONAL FACTORS: 1-2 comorbidities: polyneuropathy are also  affecting patient's functional outcome.   REHAB POTENTIAL: Good  CLINICAL DECISION MAKING: Evolving general decline in balance and endurance   EVALUATION COMPLEXITY: Mod    GOALS: Goals reviewed with patient? Yes  SHORT TERM GOALS: Target date: 06/02/2023   Pt will be able to complete standing feet together balance exercises with minimal sway and support for balance progression. Baseline: Goal status: IN PROGRESS 5/13  2.  Pt will increase LE strength 5 lbs from progress notes  Baseline:  Goal status: MET 5/13 and updated   3.  Therapy will perform Berg balance test next visit  Baseline:  Goal status: MET 5/13  4. Patient will increase BERG balance testing score by 5 points        LONG TERM GOALS: Target date: 06/30/2023   Pt will be able to ambulate community distances with no increase in symptoms for functional ADL movement. Baseline:  Goal status: IN PROGRESS 5/13  2.  Pt will increase bilateral LE strength by 30% for physical conditioning. Baseline:  Goal status: MET 5/13  3.  Pt will be report no falls while reporting to PT for functional endurance.  Baseline:  Goal status: IN PROGRESS (no falls to date, but has had near falls) 5/13  4. Patient will get up and down off the floor without assistance in order to improve household safety  Goal status: New  PLAN:  PT FREQUENCY: 1-2x/week  PT DURATION: 8 weeks  PLANNED INTERVENTIONS: 97110-Therapeutic exercises, 97530- Therapeutic activity, [id number]- Neuromuscular re-education, 97535- Self Care, 56213- Manual therapy, [id number]- Gait training, [phone]- Aquatic Therapy, and [phone]- Electrical stimulation (unattended)  PLAN FOR NEXT SESSION:  Cont with balance training, gait, LE strengthening (with awareness of recent gallbladder surgery), functional endurance, and functional mobility.  Berg balance assessment.      Fronie Jewett Ranada Vigorito, PTA 07/03/2023 12:24 PM

## 2023-07-07 ENCOUNTER — Telehealth: Payer: Self-pay | Admitting: Internal Medicine

## 2023-07-07 NOTE — Telephone Encounter (Signed)
 Called and spoke with patient. He reports for the last 3 weeks he has had no appetite. Only able to eat 1 meal per day. Denies N/V. No diarrhea, reports normal bms, "rumbling stomach." He reports fevers during the night, as high as 102 on Sunday night. Wakes up to saturated bed linens from sweating. Reports dry non-productive cough. Reports back pain from where he fell "on my side" 2 weeks ago. Will be seeing an orthopedic surgeon for this. Patient denies following up with his PCP. Recommended that patient also let them know what's going on. Patient states he will see Dr. Willy Harvest on 06/26, however he didn't want to wait to update him on current symptoms.

## 2023-07-07 NOTE — Telephone Encounter (Signed)
 PT is calling to speak with a nurse about his symptoms. He has no appetite, he can only eat one meal a day. He has an upset stomach along with night sweats and a temperature of 102 that lasted 45 min. Please advise.

## 2023-07-07 NOTE — Telephone Encounter (Signed)
 Spoke to patient's.  He has had fever once but that is the only time he took his temperature at 102.  Lots of night sweats and the symptoms described above.   He has an Ortho appointment Wednesday at noon.  Please arrange for him to see one of our apps preferably pod C but not absolutely necessary [facility], regarding his problems hopefully this week.

## 2023-07-08 NOTE — Telephone Encounter (Signed)
 Called patient. Was able to get patient scheduled for 07/09/2023 with PA Mylinda Asa. Patient verbalized time.

## 2023-07-08 NOTE — Progress Notes (Unsigned)
 07/09/2023 Patrick Kidd [id number] 03-25-1945  Referring provider: [name], MD Primary GI doctor: Dr. Willy Harvest  ASSESSMENT AND PLAN:  78 year old male with history of CLL, prostate cancer, cirrhosis secondary to alcohol/MASH with history of ascites SBP 2020, portal hypertension thrombocytopenia hepatic encephalopathy underwent subtotal cholecystectomy 03/20/2023  presents with fever, lower AB pain, urinary hesitancy and cough  Has decreased appetite with nausea, no vomiting, one meal a day, has lost 10 lbs, decreased energy, has tachycardia, urinary hesitancy last 3 days, fever at night x 3-4 weeks up to 102, can last an hour, shaking/diaphoresis with wet shirt, no chest pain No changes in bowel habits, no blood in stool, no melena Denies swelling in AB, confusion, yellowing of eyes or skin  Uncertain this is cirrhosis related, some concern for SBP with history but more concerned for urinary hesitancy, possible right lower Ab mass, and cough some concern for abscess, hernia, mass, etc. LONG discussion with patient and wife about going to the ER but they decline going, strict ER precautions discussed. - Order stat CT scan of the abdomen to evaluate right lower quadrant mass, going there now ADDENDUM CT AB AND PELVIS - 1. There is an approximately 9.5 x 12.7 cm soft tissue attenuation collection in the right lower abdomen, as described above. Findings favor probable evolving hematoma. 2. No bowel obstruction. 3. Multiple other nonacute observations, as described above  Still pending labs from the visit, very concerned with patient's increase risk of bleeding with cirrhosis, versus infection with fever/chills, was able to contact the patient and his wife and advised him to go to the ER for observation/evaluation.  With a large hematoma as patient may need blood product.  patient did have a fall 2 weeks ago as well that may be contributing to large hematoma. - Order stat labs to  assess for infection or other abnormalities including urine. - Advise ER visit if severe abdominal pain, inability to urinate, inability to pass gas, vomiting, or confusion occurs.  Cough non productive X 1 month, some DOE and decreased stamina Feels pulled something right inguinal area 6 weeks ago dinner with virus, other on sick contacts 1. No acute intrathoracic process.   Cirrhosis secondary ETOH and NASH with history of ascites,  SBP 2020, portal HTN, thrombocytopenia and hepatic encephalopathy 04/28/2023 WBC 6.7 HGB 12.7 Platelets 108.0 04/28/2023 AST 16 ALT 8 Alkphos 72 TBili 1.0 12/09/2022 INR 1.2  03/27/2023 INR 1.1 (12.2) MELD 3.0: 10 at 12/10/2022  3:35 AM MELD-Na: 8 at 12/10/2022  3:35 AM Calculated from: Serum Creatinine: 1.01 mg/dL at 91/05/7827  5:62 AM Serum Sodium: 139 mmol/L (Using max of 137 mmol/L) at 12/10/2022  3:35 AM Total Bilirubin: 1 mg/dL at 13/0/8657  8:46 AM Serum Albumin : 2.3 g/dL at 96/03/9526  4:13 AM INR(ratio): 1.2 at 12/09/2022  4:23 AM Age at listing (hypothetical): 51 years Sex: Male at 12/10/2022  3:35 AM  Serologic workup: negative 01/2019 Ascites with history of SBP 01/23/2019 -Nutrition and low sodium diet discussed with patient and information given - appears euvolemic  Varices screening / surveillance EGD:    Last EGD 10/17/2021 no signs of portal HTN or varices Rectal varices 2021 Not on prophylaxis  Hepatic encephalopathy:  Pt does not report any symptoms consistent with HE and no asterixis on exam.  Most recent HCC screening:    12/04/2022 RUQ US  with hepatic cyst 12/03/2022 CT AB and pelvis with contrast  Cirrhotic morphology of the liver. 6.1 cm right hepatic cyst for  which no specific imaging follow-up is recommended. Last AFP remote 2023  Getting CT now Provided general information to the patient: -Continue daily multivitamin -Recommended 30 minutes of aerobic and resistance exercise 3 days/week -Encouraged pt to increase protein  intake  Cholecystitis HIDA 06/02/2022 no activity in GB, no obstruction Treated with ABX, high risk of perioperative complications S/p subtotal cholecystectomy on 03/20/2023 with JP drain  Personal history of colon polyps multiple adenomas 2017  2 diminutive polyps 2021 1 adenoma 1 hyperplastic recall  suspended due to age   History of CLL Follows with Dr Rosaline Coma, last visit 11/2021  History of WPW  History of prostate cancer  Type 2 diabetes with history of neuropathy   Patient Care Team: [name], MD as PCP - General (Internal Medicine) Avanell Leigh, MD as PCP - Cardiology (Cardiology) Merriam Abbey, DO as Consulting Physician (Neurology) Szabat, Tino Foreman, RPH (Inactive) (Pharmacist) Areta Beer, RN as Case Manager  HISTORY OF PRESENT ILLNESS: 78 y.o. male with medical history significant for WPW, HTN, T2DM c/b neuropathy, CLL (stage 0), OSA (not on CPAP), GERD, h/o duodenal ulcer, diverticulosis, gout, h/o prostate cancer, BPH, hydrocele, arthritis, and cirrhosis secondary to alcohol use c/b portal hypertension, thrombocytopenia, and ascites with h/o SBP  presents for follow up of cirrhosis secondary to ETOH/NASH. Has history of the following complications from cirrhosis: portal hypertensive gastropathy, ascites, SBP, and thrombocytopenia  Last EGD was 10/17/2021  - Normal esophagus. - Erythematous mucosa in the stomach. Very mild and not friable like last year. - The examination was otherwise normal. Do not see signs of portal hypertension. - No specimens collected..  Last HCC screen: 12/04/2022 RUQ US  with hepatic cyst 12/03/2022 CT AB and pelvis with contrast  Cirrhotic morphology of the liver. 6.1 cm right hepatic cyst for which no specific imaging follow-up is recommended. Last AFP 2023 remote Last INR: 12/09/2022 1.2   Wt Readings from Last 3 Encounters:  07/09/23 219 lb (99.3 kg)  04/28/23 227 lb (103 kg)  12/22/22 228 lb (103.4 kg)    Discussed  the use of AI scribe software for clinical note transcription with the patient, who gave verbal consent to proceed.  History of Present Illness   Patrick Kidd "[name]" is a 78 year old male with cirrhosis who presents with lower abdominal pain and urinary hesitancy.  He has been experiencing lower abdominal pain in the right inguinal or groin area for approximately three weeks following a persistent cough. The pain is constant, located at his hip, and does not radiate. There is no bulge, and the pain does not worsen with movement. No testicular pain or swelling is noted.  For the past three days, he has experienced urinary hesitancy characterized by a starting and stopping stream without any burning or discomfort during urination. His stools are typically loose, which is normal for him, and there have been no changes in his bowel habits, BM1-2 x a day, no melena, no hematochezia.  He has had a nonproductive cough for about a month, with occasional shortness of breath attributed to low stamina. No chest discomfort or significant changes in his respiratory status.  He reports a significant decrease in appetite, eating only one meal a day, and has lost ten pounds. He experiences nausea but no vomiting. Despite the decreased appetite, he maintains adequate fluid intake. Denies swelling in AB, legs, no jaundice, confusion.   For the past three to four weeks, he has experienced nightly fevers reaching up to  102F, lasting 40 to 45 minutes, sometimes accompanied by chills and sweating. The fever began after the onset of his cough.       Social history:  He  reports that he quit smoking about 52 years ago. His smoking use included cigarettes. He started smoking about 58 years ago. He has a 12 pack-year smoking history. He has never been exposed to tobacco smoke. He has never used smokeless tobacco. He reports that he does not currently use alcohol. He reports that he does not use drugs.  RELEVANT  GI HISTORY, LABS, IMAGING:  CBC    Component Value Date/Time   WBC 6.7 04/28/2023 1212   RBC 4.41 04/28/2023 1212   HGB 13.8 04/28/2023 1212   HGB 16.2 11/19/2022 1052   HCT 40.6 04/28/2023 1212   PLT 108.0 (L) 04/28/2023 1212   PLT 110 (L) 11/19/2022 1052   MCV 92.2 04/28/2023 1212   MCH 32.7 12/10/2022 0335   MCHC 34.0 04/28/2023 1212   RDW 15.5 04/28/2023 1212   LYMPHSABS 3.6 04/28/2023 1212   MONOABS 0.4 04/28/2023 1212   EOSABS 0.1 04/28/2023 1212   BASOSABS 0.0 04/28/2023 1212   Recent Labs    12/03/22 1850 12/04/22 0031 12/05/22 1301 12/06/22 0443 12/07/22 0344 12/08/22 0458 12/09/22 0423 12/10/22 0335 12/22/22 1527 04/28/23 1212  HGB 14.7 12.6* 12.9* 11.7* 11.1* 11.4* 11.8* 11.4* 13.4 13.8    CMP     Component Value Date/Time   NA 139 04/28/2023 1212   K 4.7 04/28/2023 1212   CL 102 04/28/2023 1212   CO2 30 04/28/2023 1212   GLUCOSE 187 (H) 04/28/2023 1212   BUN 13 04/28/2023 1212   CREATININE 0.96 04/28/2023 1212   CREATININE 0.95 11/19/2022 1052   CREATININE 1.13 06/04/2010 1354   CALCIUM  9.5 04/28/2023 1212   PROT 7.0 04/28/2023 1212   ALBUMIN  4.2 04/28/2023 1212   AST 16 04/28/2023 1212   AST 21 11/19/2022 1052   ALT 8 04/28/2023 1212   ALT 15 11/19/2022 1052   ALKPHOS 72 04/28/2023 1212   BILITOT 1.0 04/28/2023 1212   BILITOT 1.6 (H) 11/19/2022 1052   GFRNONAA >60 12/10/2022 0335   GFRNONAA >60 11/19/2022 1052   GFRAA >60 02/06/2019 0546      Latest Ref Rng & Units 04/28/2023   12:12 PM 12/22/2022    3:27 PM 12/10/2022    3:35 AM  Hepatic Function  Total Protein 6.0 - 8.3 g/dL 7.0  6.4  4.9   Albumin  3.5 - 5.2 g/dL 4.2  4.0  2.3   AST 0 - 37 U/L 16  21  18    ALT 0 - 53 U/L 8  13  20    Alk Phosphatase 39 - 117 U/L 72  96  71   Total Bilirubin 0.2 - 1.2 mg/dL 1.0  1.1  1.0   Bilirubin, Direct 0.0 - 0.3 mg/dL 0.3         Latest Ref Rng & Units 01/31/2016    2:26 PM 11/11/2017   11:48 AM 10/08/2021   11:12 AM  Hepatitis C  AFP <6.1  ng/mL 3.1  3.9  2.3     Current Medications:   Current Outpatient Medications (Endocrine & Metabolic):    insulin  glargine, 2 Unit Dial, (TOUJEO  MAX SOLOSTAR) 300 UNIT/ML Solostar Pen, Inject 12 Units into the skin daily. Via SANOFI pt assistance   metFORMIN  (GLUCOPHAGE ) 500 MG tablet, TAKE 1 TABLET BY MOUTH TWICE A DAY WITH FOOD      Current  Outpatient Medications (Other):    gabapentin  (NEURONTIN ) 300 MG capsule, Take 1 capsule (300 mg total) by mouth 3 (three) times daily.   Insulin  Pen Needle (BD PEN NEEDLE MINI ULTRAFINE) 31G X 5 MM MISC, INJECT 1 ACT INTO THE SKIN DAILY. USE TO ADMINISTER INSULIN . DX E11.9   Strength: 31G X 5 MM   Multiple Vitamins-Minerals (CENTRUM SILVER 50+MEN) TABS, Take 1 tablet by mouth daily.   pantoprazole  (PROTONIX ) 40 MG tablet, TAKE 1 TABLET BY MOUTH 2 TIMES DAILY 30 MINUTES BEFORE MEALS (BREAKFAST AND SUPPER)  Medical History:  Past Medical History:  Diagnosis Date   Acrophobia    Alcoholic cirrhosis (HCC) 10/04/2015   Anemia    Arthritis    foot by big toe   BPH associated with nocturia    Cataract    removed both eyes   Cholecystitis 05/2022   tx Abx   Chronic cough    PMH of   CLL (chronic lymphocytic leukemia) (HCC)    COVID-19 12/2018   Diabetes mellitus without complication (HCC)    Diverticulosis 07/03/2010   Colonoscopy.    Duodenal ulcer 2017   Fallen arches    Bilateral   GERD (gastroesophageal reflux disease)    Gout    Granuloma annulare    Hx of adenomatous colonic polyps multiple   Hydrocele 2011   Large septated right hydrocele   Liver cyst    Liver lesion    Nonspecific elevation of levels of transaminase or lactic acid dehydrogenase (LDH)    Obesity    Peripheral neuropathy    Plantar fasciitis    PMH of   Portal hypertension (HCC) 2017   Prostate cancer (HCC)    Right shoulder pain 11/2017   Sleep apnea    no cpap, patient denies   Thiamine  deficiency    Wears reading eyeglasses    WPW  (Wolff-Parkinson-White syndrome) 02/05/2019   Allergies:  Allergies  Allergen Reactions   Rifaximin  Nausea And Vomiting    Required EMS visit, although patient did not go to hospital   Beta Adrenergic Blockers     Likely WPW.  Use with caution   Calcium  Channel Blockers     Likely WPW.  Use with caution.     Surgical History:  He  has a past surgical history that includes Wisdom tooth extraction; Flexible sigmoidoscopy (2000); Colonoscopy w/ polypectomy (07/03/2010); Cataract extraction, bilateral (12/2011); Colonoscopy (2017); Sigmoidoscopy; Esophagogastroduodenoscopy (egd) with propofol  (N/A, 01/08/2016); Radioactive seed implant (N/A, 03/15/2018); SPACE OAR INSTILLATION (N/A, 03/15/2018); Cystoscopy (N/A, 03/15/2018); Back surgery (04/28/2022); IR Perc Cholecystostomy (12/05/2022); IR Perc Cholecystostomy (12/09/2022); Radiology with anesthesia (N/A, 12/09/2022); IR PATIENT EVAL TECH 0-60 MINS (12/26/2022); IR Radiologist Eval & Mgmt (01/16/2023); and IR EXCHANGE BILIARY DRAIN (02/17/2023). Family History:  His family history includes Leukemia in his father; Lung cancer in his mother.  REVIEW OF SYSTEMS  : All other systems reviewed and negative except where noted in the History of Present Illness.  PHYSICAL EXAM: BP 110/72   Pulse (!) 118   Ht 6\' 2"  (1.88 m)   Wt 219 lb (99.3 kg)   BMI 28.12 kg/m  Physical Exam   GENERAL APPEARANCE: obese, in no apparent distress. HEENT: No cervical lymphadenopathy, unremarkable thyroid , sclerae anicteric, conjunctiva pink. RESPIRATORY: Respiratory effort normal, breath sounds clear to auscultation, no wheezing or rales. CARDIO: Tachycardic with regular irregular rhythm, peripheral pulses intact. ABDOMEN: Right lower quadrant hard, mobile, tender mass extending into the right lower AB. No ascites noted on exam. Hyperactive bowel  sounds in all quadrants. RECTAL: Declines. MUSCULOSKELETAL: Full range of motion, antalgic, slow gait, without  edema. SKIN: Dry, intact without rashes or lesions. No jaundice. NEURO: Alert, oriented, no focal deficits, no hepatic encephalopathy. PSYCH: Cooperative, normal mood and affect. EXTREMITIES: No edema.      Edmonia Gottron, PA-C 10:28 AM

## 2023-07-09 ENCOUNTER — Telehealth: Payer: Self-pay | Admitting: Physician Assistant

## 2023-07-09 ENCOUNTER — Observation Stay (HOSPITAL_COMMUNITY)

## 2023-07-09 ENCOUNTER — Encounter: Payer: Self-pay | Admitting: Physician Assistant

## 2023-07-09 ENCOUNTER — Emergency Department (HOSPITAL_COMMUNITY)

## 2023-07-09 ENCOUNTER — Inpatient Hospital Stay (HOSPITAL_COMMUNITY)
Admission: EM | Admit: 2023-07-09 | Discharge: 2023-07-20 | DRG: 871 | Disposition: A | Discharge status: Home Health | Source: Ambulatory Visit | Attending: Internal Medicine | Admitting: Internal Medicine

## 2023-07-09 ENCOUNTER — Ambulatory Visit (HOSPITAL_COMMUNITY)
Admission: RE | Admit: 2023-07-09 | Discharge: 2023-07-09 | Disposition: A | Discharge status: Home / Self Care | Source: Ambulatory Visit | Attending: Physician Assistant | Admitting: Physician Assistant

## 2023-07-09 ENCOUNTER — Encounter (HOSPITAL_COMMUNITY): Payer: Self-pay

## 2023-07-09 ENCOUNTER — Ambulatory Visit: Admitting: Physician Assistant

## 2023-07-09 ENCOUNTER — Other Ambulatory Visit (INDEPENDENT_AMBULATORY_CARE_PROVIDER_SITE_OTHER)

## 2023-07-09 ENCOUNTER — Other Ambulatory Visit: Payer: Self-pay

## 2023-07-09 ENCOUNTER — Ambulatory Visit (INDEPENDENT_AMBULATORY_CARE_PROVIDER_SITE_OTHER)
Admission: RE | Admit: 2023-07-09 | Discharge: 2023-07-09 | Disposition: A | Discharge status: Home / Self Care | Source: Ambulatory Visit | Attending: Physician Assistant | Admitting: Physician Assistant

## 2023-07-09 ENCOUNTER — Ambulatory Visit: Payer: Self-pay | Admitting: Physician Assistant

## 2023-07-09 VITALS — BP 110/72 | HR 118 | Ht 74.0 in | Wt 219.0 lb

## 2023-07-09 DIAGNOSIS — G928 Other toxic encephalopathy: Secondary | ICD-10-CM | POA: Diagnosis not present

## 2023-07-09 DIAGNOSIS — R1900 Intra-abdominal and pelvic swelling, mass and lump, unspecified site: Secondary | ICD-10-CM | POA: Insufficient documentation

## 2023-07-09 DIAGNOSIS — Z8616 Personal history of COVID-19: Secondary | ICD-10-CM | POA: Diagnosis not present

## 2023-07-09 DIAGNOSIS — K6819 Other retroperitoneal abscess: Secondary | ICD-10-CM | POA: Diagnosis present

## 2023-07-09 DIAGNOSIS — G4733 Obstructive sleep apnea (adult) (pediatric): Secondary | ICD-10-CM | POA: Diagnosis not present

## 2023-07-09 DIAGNOSIS — C9110 Chronic lymphocytic leukemia of B-cell type not having achieved remission: Secondary | ICD-10-CM | POA: Diagnosis present

## 2023-07-09 DIAGNOSIS — K7581 Nonalcoholic steatohepatitis (NASH): Secondary | ICD-10-CM | POA: Diagnosis present

## 2023-07-09 DIAGNOSIS — M545 Low back pain, unspecified: Secondary | ICD-10-CM

## 2023-07-09 DIAGNOSIS — E1142 Type 2 diabetes mellitus with diabetic polyneuropathy: Secondary | ICD-10-CM | POA: Diagnosis present

## 2023-07-09 DIAGNOSIS — B961 Klebsiella pneumoniae [K. pneumoniae] as the cause of diseases classified elsewhere: Secondary | ICD-10-CM | POA: Diagnosis not present

## 2023-07-09 DIAGNOSIS — Z881 Allergy status to other antibiotic agents status: Secondary | ICD-10-CM

## 2023-07-09 DIAGNOSIS — Z8546 Personal history of malignant neoplasm of prostate: Secondary | ICD-10-CM

## 2023-07-09 DIAGNOSIS — R413 Other amnesia: Secondary | ICD-10-CM | POA: Diagnosis present

## 2023-07-09 DIAGNOSIS — D696 Thrombocytopenia, unspecified: Secondary | ICD-10-CM | POA: Diagnosis present

## 2023-07-09 DIAGNOSIS — F1090 Alcohol use, unspecified, uncomplicated: Secondary | ICD-10-CM | POA: Diagnosis not present

## 2023-07-09 DIAGNOSIS — R1030 Lower abdominal pain, unspecified: Secondary | ICD-10-CM | POA: Diagnosis not present

## 2023-07-09 DIAGNOSIS — Z860101 Personal history of adenomatous and serrated colon polyps: Secondary | ICD-10-CM

## 2023-07-09 DIAGNOSIS — K7030 Alcoholic cirrhosis of liver without ascites: Secondary | ICD-10-CM

## 2023-07-09 DIAGNOSIS — R059 Cough, unspecified: Secondary | ICD-10-CM

## 2023-07-09 DIAGNOSIS — R3911 Hesitancy of micturition: Secondary | ICD-10-CM | POA: Diagnosis present

## 2023-07-09 DIAGNOSIS — Z794 Long term (current) use of insulin: Principal | ICD-10-CM

## 2023-07-09 DIAGNOSIS — R161 Splenomegaly, not elsewhere classified: Secondary | ICD-10-CM | POA: Diagnosis not present

## 2023-07-09 DIAGNOSIS — R58 Hemorrhage, not elsewhere classified: Secondary | ICD-10-CM | POA: Diagnosis not present

## 2023-07-09 DIAGNOSIS — I456 Pre-excitation syndrome: Secondary | ICD-10-CM | POA: Diagnosis present

## 2023-07-09 DIAGNOSIS — M109 Gout, unspecified: Secondary | ICD-10-CM | POA: Diagnosis present

## 2023-07-09 DIAGNOSIS — K766 Portal hypertension: Secondary | ICD-10-CM

## 2023-07-09 DIAGNOSIS — N200 Calculus of kidney: Secondary | ICD-10-CM | POA: Diagnosis not present

## 2023-07-09 DIAGNOSIS — K219 Gastro-esophageal reflux disease without esophagitis: Secondary | ICD-10-CM | POA: Diagnosis present

## 2023-07-09 DIAGNOSIS — R6510 Systemic inflammatory response syndrome (SIRS) of non-infectious origin without acute organ dysfunction: Secondary | ICD-10-CM | POA: Diagnosis not present

## 2023-07-09 DIAGNOSIS — G8929 Other chronic pain: Secondary | ICD-10-CM | POA: Diagnosis present

## 2023-07-09 DIAGNOSIS — R0602 Shortness of breath: Secondary | ICD-10-CM | POA: Diagnosis not present

## 2023-07-09 DIAGNOSIS — K5730 Diverticulosis of large intestine without perforation or abscess without bleeding: Secondary | ICD-10-CM | POA: Diagnosis not present

## 2023-07-09 DIAGNOSIS — Z87891 Personal history of nicotine dependence: Secondary | ICD-10-CM | POA: Diagnosis not present

## 2023-07-09 DIAGNOSIS — R509 Fever, unspecified: Secondary | ICD-10-CM

## 2023-07-09 DIAGNOSIS — B9689 Other specified bacterial agents as the cause of diseases classified elsewhere: Secondary | ICD-10-CM | POA: Diagnosis present

## 2023-07-09 DIAGNOSIS — I10 Essential (primary) hypertension: Secondary | ICD-10-CM | POA: Diagnosis present

## 2023-07-09 DIAGNOSIS — R5383 Other fatigue: Secondary | ICD-10-CM | POA: Diagnosis not present

## 2023-07-09 DIAGNOSIS — R109 Unspecified abdominal pain: Secondary | ICD-10-CM | POA: Diagnosis not present

## 2023-07-09 DIAGNOSIS — R630 Anorexia: Secondary | ICD-10-CM | POA: Diagnosis not present

## 2023-07-09 DIAGNOSIS — R5381 Other malaise: Secondary | ICD-10-CM | POA: Diagnosis present

## 2023-07-09 DIAGNOSIS — K650 Generalized (acute) peritonitis: Secondary | ICD-10-CM | POA: Diagnosis not present

## 2023-07-09 DIAGNOSIS — D684 Acquired coagulation factor deficiency: Secondary | ICD-10-CM | POA: Diagnosis not present

## 2023-07-09 DIAGNOSIS — K7682 Hepatic encephalopathy: Secondary | ICD-10-CM

## 2023-07-09 DIAGNOSIS — K56600 Partial intestinal obstruction, unspecified as to cause: Secondary | ICD-10-CM | POA: Insufficient documentation

## 2023-07-09 DIAGNOSIS — A419 Sepsis, unspecified organism: Secondary | ICD-10-CM | POA: Diagnosis not present

## 2023-07-09 DIAGNOSIS — E876 Hypokalemia: Secondary | ICD-10-CM | POA: Diagnosis not present

## 2023-07-09 DIAGNOSIS — K7460 Unspecified cirrhosis of liver: Secondary | ICD-10-CM

## 2023-07-09 DIAGNOSIS — E1165 Type 2 diabetes mellitus with hyperglycemia: Secondary | ICD-10-CM | POA: Diagnosis present

## 2023-07-09 DIAGNOSIS — Z8719 Personal history of other diseases of the digestive system: Secondary | ICD-10-CM | POA: Diagnosis not present

## 2023-07-09 DIAGNOSIS — K6811 Postprocedural retroperitoneal abscess: Secondary | ICD-10-CM | POA: Diagnosis not present

## 2023-07-09 DIAGNOSIS — D62 Acute posthemorrhagic anemia: Secondary | ICD-10-CM | POA: Diagnosis not present

## 2023-07-09 DIAGNOSIS — M5416 Radiculopathy, lumbar region: Secondary | ICD-10-CM | POA: Diagnosis not present

## 2023-07-09 DIAGNOSIS — K683 Retroperitoneal hematoma: Secondary | ICD-10-CM | POA: Diagnosis present

## 2023-07-09 DIAGNOSIS — Z7984 Long term (current) use of oral hypoglycemic drugs: Secondary | ICD-10-CM

## 2023-07-09 DIAGNOSIS — M48061 Spinal stenosis, lumbar region without neurogenic claudication: Secondary | ICD-10-CM | POA: Diagnosis present

## 2023-07-09 DIAGNOSIS — N401 Enlarged prostate with lower urinary tract symptoms: Secondary | ICD-10-CM | POA: Diagnosis present

## 2023-07-09 DIAGNOSIS — R634 Abnormal weight loss: Secondary | ICD-10-CM | POA: Diagnosis not present

## 2023-07-09 DIAGNOSIS — M47817 Spondylosis without myelopathy or radiculopathy, lumbosacral region: Secondary | ICD-10-CM | POA: Diagnosis not present

## 2023-07-09 DIAGNOSIS — K5720 Diverticulitis of large intestine with perforation and abscess without bleeding: Secondary | ICD-10-CM | POA: Diagnosis not present

## 2023-07-09 DIAGNOSIS — Z79899 Other long term (current) drug therapy: Secondary | ICD-10-CM | POA: Diagnosis not present

## 2023-07-09 DIAGNOSIS — Z6828 Body mass index (BMI) 28.0-28.9, adult: Secondary | ICD-10-CM | POA: Diagnosis not present

## 2023-07-09 DIAGNOSIS — M47816 Spondylosis without myelopathy or radiculopathy, lumbar region: Secondary | ICD-10-CM | POA: Diagnosis not present

## 2023-07-09 DIAGNOSIS — T402X5A Adverse effect of other opioids, initial encounter: Secondary | ICD-10-CM | POA: Diagnosis present

## 2023-07-09 DIAGNOSIS — K652 Spontaneous bacterial peritonitis: Secondary | ICD-10-CM

## 2023-07-09 DIAGNOSIS — R110 Nausea: Secondary | ICD-10-CM | POA: Diagnosis not present

## 2023-07-09 DIAGNOSIS — W19XXXA Unspecified fall, initial encounter: Secondary | ICD-10-CM | POA: Diagnosis present

## 2023-07-09 DIAGNOSIS — T148XXA Other injury of unspecified body region, initial encounter: Principal | ICD-10-CM

## 2023-07-09 DIAGNOSIS — K7689 Other specified diseases of liver: Secondary | ICD-10-CM | POA: Diagnosis not present

## 2023-07-09 DIAGNOSIS — R1031 Right lower quadrant pain: Secondary | ICD-10-CM | POA: Diagnosis not present

## 2023-07-09 DIAGNOSIS — Z9049 Acquired absence of other specified parts of digestive tract: Secondary | ICD-10-CM | POA: Diagnosis not present

## 2023-07-09 LAB — CBC WITH DIFFERENTIAL/PLATELET
Basophils Absolute: 0.1 10*3/uL (ref 0.0–0.1)
Basophils Relative: 0.3 % (ref 0.0–3.0)
Eosinophils Absolute: 0 10*3/uL (ref 0.0–0.7)
Eosinophils Relative: 0.2 % (ref 0.0–5.0)
HCT: 33.2 % — ABNORMAL LOW (ref 39.0–52.0)
Hemoglobin: 11.3 g/dL — ABNORMAL LOW (ref 13.0–17.0)
Lymphocytes Relative: 17.6 % (ref 12.0–46.0)
Lymphs Abs: 2.7 10*3/uL (ref 0.7–4.0)
MCHC: 34.1 g/dL (ref 30.0–36.0)
MCV: 88 fl (ref 78.0–100.0)
Monocytes Absolute: 1 10*3/uL (ref 0.1–1.0)
Monocytes Relative: 6.2 % (ref 3.0–12.0)
Neutro Abs: 11.6 10*3/uL — ABNORMAL HIGH (ref 1.4–7.7)
Neutrophils Relative %: 75.7 % (ref 43.0–77.0)
Platelets: 226 10*3/uL (ref 150.0–400.0)
RBC: 3.77 Mil/uL — ABNORMAL LOW (ref 4.22–5.81)
RDW: 12.4 % (ref 11.5–15.5)
WBC: 15.3 10*3/uL — ABNORMAL HIGH (ref 4.0–10.5)

## 2023-07-09 LAB — CBC
HCT: 32.1 % — ABNORMAL LOW (ref 39.0–52.0)
HCT: 31.2 % — ABNORMAL LOW (ref 39.0–52.0)
Hemoglobin: 10.7 g/dL — ABNORMAL LOW (ref 13.0–17.0)
Hemoglobin: 10.5 g/dL — ABNORMAL LOW (ref 13.0–17.0)
MCH: 30.2 pg (ref 26.0–34.0)
MCH: 30.3 pg (ref 26.0–34.0)
MCHC: 33.3 g/dL (ref 30.0–36.0)
MCHC: 33.7 g/dL (ref 30.0–36.0)
MCV: 90.7 fL (ref 80.0–100.0)
MCV: 89.9 fL (ref 80.0–100.0)
Platelets: 191 10*3/uL (ref 150–400)
Platelets: 186 10*3/uL (ref 150–400)
RBC: 3.54 MIL/uL — ABNORMAL LOW (ref 4.22–5.81)
RBC: 3.47 MIL/uL — ABNORMAL LOW (ref 4.22–5.81)
RDW: 12.2 % (ref 11.5–15.5)
RDW: 12.3 % (ref 11.5–15.5)
WBC: 15.7 10*3/uL — ABNORMAL HIGH (ref 4.0–10.5)
WBC: 14.2 10*3/uL — ABNORMAL HIGH (ref 4.0–10.5)
nRBC: 0 % (ref 0.0–0.2)
nRBC: 0 % (ref 0.0–0.2)

## 2023-07-09 LAB — COMPREHENSIVE METABOLIC PANEL WITH GFR
ALT: 13 U/L (ref 0–44)
AST: 17 U/L (ref 15–41)
Albumin: 2.6 g/dL — ABNORMAL LOW (ref 3.5–5.0)
Alkaline Phosphatase: 103 U/L (ref 38–126)
Anion gap: 10 (ref 5–15)
BUN: 18 mg/dL (ref 8–23)
CO2: 25 mmol/L (ref 22–32)
Calcium: 8.6 mg/dL — ABNORMAL LOW (ref 8.9–10.3)
Chloride: 98 mmol/L (ref 98–111)
Creatinine, Ser: 1.02 mg/dL (ref 0.61–1.24)
GFR, Estimated: >60 mL/min (ref >60)
Glucose, Bld: 226 mg/dL — ABNORMAL HIGH (ref 70–99)
Potassium: 4.3 mmol/L (ref 3.5–5.1)
Sodium: 133 mmol/L — ABNORMAL LOW (ref 135–145)
Total Bilirubin: 1.4 mg/dL — ABNORMAL HIGH (ref 0.0–1.2)
Total Protein: 6.3 g/dL — ABNORMAL LOW (ref 6.5–8.1)

## 2023-07-09 LAB — LIPASE, BLOOD: Lipase: 20 U/L (ref 11–51)

## 2023-07-09 LAB — URINALYSIS, ROUTINE W REFLEX MICROSCOPIC
Bacteria, UA: NONE SEEN
Bilirubin Urine: NEGATIVE
Glucose, UA: NEGATIVE mg/dL
Hgb urine dipstick: NEGATIVE
Hgb urine dipstick: NEGATIVE
Ketones, ur: 15 — AB
Ketones, ur: 5 mg/dL — AB
Leukocytes,Ua: NEGATIVE
Leukocytes,Ua: NEGATIVE
Nitrite: NEGATIVE
Nitrite: NEGATIVE
Protein, ur: 30 mg/dL — AB
Specific Gravity, Urine: 1.02 (ref 1.000–1.030)
Specific Gravity, Urine: >1.046 — ABNORMAL HIGH (ref 1.005–1.030)
Total Protein, Urine: 30 — AB
Urine Glucose: NEGATIVE
Urobilinogen, UA: 4 — AB (ref 0.0–1.0)
pH: 6 (ref 5.0–8.0)
pH: 5 (ref 5.0–8.0)

## 2023-07-09 LAB — HEPATIC FUNCTION PANEL
ALT: 10 U/L (ref 0–53)
AST: 12 U/L (ref 0–37)
Albumin: 3.3 g/dL — ABNORMAL LOW (ref 3.5–5.2)
Alkaline Phosphatase: 109 U/L (ref 39–117)
Bilirubin, Direct: 0.6 mg/dL — ABNORMAL HIGH (ref 0.0–0.3)
Total Bilirubin: 1.4 mg/dL — ABNORMAL HIGH (ref 0.2–1.2)
Total Protein: 6.5 g/dL (ref 6.0–8.3)

## 2023-07-09 LAB — BASIC METABOLIC PANEL WITH GFR
BUN: 15 mg/dL (ref 6–23)
CO2: 27 meq/L (ref 19–32)
Calcium: 9.1 mg/dL (ref 8.4–10.5)
Chloride: 96 meq/L (ref 96–112)
Creatinine, Ser: 1 mg/dL (ref 0.40–1.50)
GFR: 71.98 mL/min (ref >60.00)
Glucose, Bld: 191 mg/dL — ABNORMAL HIGH (ref 70–99)
Potassium: 4.6 meq/L (ref 3.5–5.1)
Sodium: 136 meq/L (ref 135–145)

## 2023-07-09 LAB — PROTIME-INR
INR: 1.5 ratio — ABNORMAL HIGH (ref 0.8–1.0)
Prothrombin Time: 15.2 s — ABNORMAL HIGH (ref 9.6–13.1)

## 2023-07-09 LAB — AMMONIA: Ammonia: 39 umol/L — ABNORMAL HIGH (ref 11–35)

## 2023-07-09 LAB — I-STAT CG4 LACTIC ACID, ED: Lactic Acid, Venous: 1 mmol/L (ref 0.5–1.9)

## 2023-07-09 MED ORDER — METHOCARBAMOL 500 MG PO TABS
750.0000 mg | ORAL_TABLET | Freq: Three times a day (TID) | ORAL | Status: DC | PRN
  Administered 2023-07-09: 750 mg via ORAL
  Filled 2023-07-09: qty 2

## 2023-07-09 MED ORDER — ONDANSETRON HCL 4 MG/2ML IJ SOLN
4.0000 mg | Freq: Once | INTRAMUSCULAR | Status: AC
  Administered 2023-07-09: 4 mg via INTRAVENOUS
  Filled 2023-07-09: qty 2

## 2023-07-09 MED ORDER — IOHEXOL 300 MG/ML  SOLN
100.0000 mL | Freq: Once | INTRAMUSCULAR | Status: AC | PRN
  Administered 2023-07-09: 100 mL via INTRAVENOUS

## 2023-07-09 MED ORDER — PHYTONADIONE 5 MG PO TABS
2.5000 mg | ORAL_TABLET | Freq: Once | ORAL | Status: AC
  Administered 2023-07-09: 2.5 mg via ORAL
  Filled 2023-07-09 (×2): qty 1

## 2023-07-09 MED ORDER — ADULT MULTIVITAMIN W/MINERALS CH
1.0000 | ORAL_TABLET | Freq: Every day | ORAL | Status: DC
  Administered 2023-07-09 – 2023-07-20 (×11): 1 via ORAL
  Filled 2023-07-09 (×11): qty 1

## 2023-07-09 MED ORDER — BENZONATATE 100 MG PO CAPS
100.0000 mg | ORAL_CAPSULE | Freq: Three times a day (TID) | ORAL | Status: DC | PRN
  Administered 2023-07-09 – 2023-07-10 (×3): 100 mg via ORAL
  Filled 2023-07-09 (×3): qty 1

## 2023-07-09 MED ORDER — ONDANSETRON HCL 4 MG/2ML IJ SOLN: 4.0000 mg | Freq: Four times a day (QID) | INTRAMUSCULAR | Status: DC | PRN

## 2023-07-09 MED ORDER — SENNOSIDES-DOCUSATE SODIUM 8.6-50 MG PO TABS: 1.0000 | ORAL_TABLET | Freq: Every evening | ORAL | Status: DC | PRN

## 2023-07-09 MED ORDER — GABAPENTIN 300 MG PO CAPS
300.0000 mg | ORAL_CAPSULE | Freq: Three times a day (TID) | ORAL | Status: DC
  Administered 2023-07-09 – 2023-07-20 (×31): 300 mg via ORAL
  Filled 2023-07-09 (×31): qty 1

## 2023-07-09 MED ORDER — BOOST / RESOURCE BREEZE PO LIQD CUSTOM
1.0000 | Freq: Three times a day (TID) | ORAL | Status: DC
  Administered 2023-07-09 – 2023-07-19 (×14): 1 via ORAL

## 2023-07-09 MED ORDER — VANCOMYCIN HCL 2000 MG/400ML IV SOLN
2000.0000 mg | Freq: Once | INTRAVENOUS | Status: AC
  Administered 2023-07-09: 2000 mg via INTRAVENOUS
  Filled 2023-07-09: qty 400

## 2023-07-09 MED ORDER — ENOXAPARIN SODIUM 40 MG/0.4ML IJ SOSY: 40.0000 mg | PREFILLED_SYRINGE | INTRAMUSCULAR | Status: DC

## 2023-07-09 MED ORDER — SODIUM CHLORIDE 0.9 % IV SOLN
2.0000 g | Freq: Once | INTRAVENOUS | Status: AC
  Administered 2023-07-09: 2 g via INTRAVENOUS
  Filled 2023-07-09 (×2): qty 12.5

## 2023-07-09 MED ORDER — ACETAMINOPHEN 325 MG PO TABS
650.0000 mg | ORAL_TABLET | Freq: Four times a day (QID) | ORAL | Status: DC | PRN
  Administered 2023-07-10 – 2023-07-13 (×4): 650 mg via ORAL
  Filled 2023-07-09 (×4): qty 2

## 2023-07-09 MED ORDER — HYDROMORPHONE HCL 1 MG/ML IJ SOLN
0.5000 mg | Freq: Four times a day (QID) | INTRAMUSCULAR | Status: DC | PRN
  Administered 2023-07-09 – 2023-07-10 (×2): 0.5 mg via INTRAVENOUS
  Filled 2023-07-09 (×2): qty 0.5

## 2023-07-09 MED ORDER — INSULIN GLARGINE (2 UNIT DIAL) 300 UNIT/ML ~~LOC~~ SOPN: 12.0000 [IU] | PEN_INJECTOR | Freq: Every day | SUBCUTANEOUS | Status: DC

## 2023-07-09 MED ORDER — PANTOPRAZOLE SODIUM 40 MG PO TBEC
40.0000 mg | DELAYED_RELEASE_TABLET | Freq: Two times a day (BID) | ORAL | Status: DC
  Administered 2023-07-09 – 2023-07-20 (×21): 40 mg via ORAL
  Filled 2023-07-09 (×21): qty 1

## 2023-07-09 MED ORDER — ONDANSETRON HCL 4 MG PO TABS: 4.0000 mg | ORAL_TABLET | Freq: Four times a day (QID) | ORAL | Status: DC | PRN

## 2023-07-09 MED ORDER — INSULIN ASPART 100 UNIT/ML IJ SOLN
0.0000 [IU] | Freq: Three times a day (TID) | INTRAMUSCULAR | Status: DC
  Administered 2023-07-10: 3 [IU] via SUBCUTANEOUS
  Administered 2023-07-10: 5 [IU] via SUBCUTANEOUS
  Administered 2023-07-10: 3 [IU] via SUBCUTANEOUS
  Administered 2023-07-11 (×3): 5 [IU] via SUBCUTANEOUS
  Administered 2023-07-12: 3 [IU] via SUBCUTANEOUS
  Administered 2023-07-12: 2 [IU] via SUBCUTANEOUS
  Administered 2023-07-12: 5 [IU] via SUBCUTANEOUS
  Administered 2023-07-13 (×3): 3 [IU] via SUBCUTANEOUS
  Administered 2023-07-14: 2 [IU] via SUBCUTANEOUS
  Administered 2023-07-14: 3 [IU] via SUBCUTANEOUS
  Administered 2023-07-14: 2 [IU] via SUBCUTANEOUS
  Administered 2023-07-15 (×3): 3 [IU] via SUBCUTANEOUS
  Administered 2023-07-16 (×2): 2 [IU] via SUBCUTANEOUS
  Administered 2023-07-17: 3 [IU] via SUBCUTANEOUS
  Administered 2023-07-17: 2 [IU] via SUBCUTANEOUS
  Administered 2023-07-17 – 2023-07-18 (×2): 3 [IU] via SUBCUTANEOUS
  Administered 2023-07-18 – 2023-07-19 (×2): 2 [IU] via SUBCUTANEOUS
  Administered 2023-07-19: 3 [IU] via SUBCUTANEOUS
  Administered 2023-07-20: 2 [IU] via SUBCUTANEOUS

## 2023-07-09 MED ORDER — SODIUM CHLORIDE 0.9 % IV BOLUS
1000.0000 mL | Freq: Once | INTRAVENOUS | Status: AC
  Administered 2023-07-09: 1000 mL via INTRAVENOUS

## 2023-07-09 MED ORDER — IOHEXOL 300 MG/ML  SOLN
100.0000 mL | Freq: Once | INTRAMUSCULAR | Status: AC | PRN
Start: 2023-07-09 — End: 2023-07-15
  Administered 2023-07-15: 100 mL via INTRAVENOUS

## 2023-07-09 MED ORDER — INSULIN GLARGINE-YFGN 100 UNIT/ML ~~LOC~~ SOLN
12.0000 [IU] | Freq: Every day | SUBCUTANEOUS | Status: DC
  Administered 2023-07-10 – 2023-07-11 (×2): 12 [IU] via SUBCUTANEOUS
  Filled 2023-07-09 (×2): qty 0.12

## 2023-07-09 MED ORDER — MORPHINE SULFATE (PF) 4 MG/ML IV SOLN
4.0000 mg | Freq: Once | INTRAVENOUS | Status: AC
  Administered 2023-07-09: 4 mg via INTRAVENOUS
  Filled 2023-07-09: qty 1

## 2023-07-09 MED ORDER — INSULIN ASPART 100 UNIT/ML IJ SOLN: 0.0000 [IU] | Freq: Every day | INTRAMUSCULAR | Status: DC

## 2023-07-09 MED ORDER — ACETAMINOPHEN 650 MG RE SUPP: 650.0000 mg | Freq: Four times a day (QID) | RECTAL | Status: DC | PRN

## 2023-07-09 MED ORDER — LIDOCAINE 5 % EX PTCH
1.0000 | MEDICATED_PATCH | CUTANEOUS | Status: DC
  Administered 2023-07-09 – 2023-07-19 (×11): 1 via TRANSDERMAL
  Filled 2023-07-09 (×11): qty 1

## 2023-07-09 NOTE — ED Triage Notes (Signed)
 Pt came in for a mass on the right side of his abdomen. His gastroenterologist saw him today and recommended for him to come to ED after getting a CT scan of his abdomen today. Pt has been weak w/ no appetite.

## 2023-07-09 NOTE — Progress Notes (Signed)
 ED Pharmacy Antibiotic Sign Off An antibiotic consult was received from an ED provider for Vancomycin , Cefepime  per pharmacy dosing for sepsis. A chart review was completed to assess appropriateness.   The following one time order(s) were placed:  Vancomycin  2g  Cefepime  2g  Further antibiotic and/or antibiotic pharmacy consults should be ordered by the admitting provider if indicated.   Thank you for allowing pharmacy to be a part of this patient's care.   Kendall Pauls PharmD, BCPS [facility] main pharmacy [phone] 07/09/2023 6:32 PM

## 2023-07-09 NOTE — H&P (Signed)
 History and Physical  ASIR BINGLEY NWG:[id number] DOB: 15-Dec-1945 DOA: 07/09/2023  PCP: [name], MD   Chief Complaint: Abdominal hematoma  HPI: Patrick Kidd is a 78 y.o. male with medical history significant for CLL, prostate cancer, BPH, alcoholic cirrhosis, peripheral neuropathy, T2DM, osteoarthritis and chronic back pain who presented to the ED for management of abdominal hematoma. Patient reports that over the last 3 to 4 weeks, he has had night sweats that can last up to an hour. Around the same time, he has had no appetite and no energy. About 2 weeks ago, he fell on his back constant aching low back pain since then. He also reports intermittent cough and urinary hesitancy but denies any dysuria, chest pain, shortness of breath, abdominal pain, nausea, vomiting, confusion, bloody stools or dizziness.  ED Course: Initial vitals show temp 97.9->99.7->100.4, HR 100s to 110s, SBP 110s to 150s, SpO2 99% on room air.  Initial labs significant for WBC 15.7, Hgb 10.7 (from 11.3 at GI office this morning, 13.8 2 months ago), platelet 191, sodium 133, glucose 226, creatinine 1.02, bilirubin 1.4, normal lipase, normal lactic acid, UA shows mild ketonuria and proteinuria but no signs of infection. CXR with no active disease. CT A/P shows a 9.5 x 12.7 cm soft tissue attenuation collection in the right lower abdomen that favors probable evolving hematoma.  CT L-spine shows moderate central canal stenosis at multiple levels of the L-spine but no acute fracture or traumatic subluxation. Patient received IV morphine  4 mg x 1, IV Zosyn  4 mg x 1, IV vancomycin  and IV cefepime . IR was consulted for evaluation and recommended 24-hour observation. TRH was consulted for admission.  Review of Systems: Please see HPI for pertinent positives and negatives. A complete 10 system review of systems are otherwise negative.  Past Medical History:  Diagnosis Date   Acrophobia    Alcoholic cirrhosis (HCC)  10/04/2015   Anemia    Arthritis    foot by big toe   BPH associated with nocturia    Cataract    removed both eyes   Cholecystitis 05/2022   tx Abx   Chronic cough    PMH of   CLL (chronic lymphocytic leukemia) (HCC)    COVID-19 12/2018   Diabetes mellitus without complication (HCC)    Diverticulosis 07/03/2010   Colonoscopy.    Duodenal ulcer 2017   Fallen arches    Bilateral   GERD (gastroesophageal reflux disease)    Gout    Granuloma annulare    Hx of adenomatous colonic polyps multiple   Hydrocele 2011   Large septated right hydrocele   Liver cyst    Liver lesion    Nonspecific elevation of levels of transaminase or lactic acid dehydrogenase (LDH)    Obesity    Peripheral neuropathy    Plantar fasciitis    PMH of   Portal hypertension (HCC) 2017   Prostate cancer (HCC)    Right shoulder pain 11/2017   Sleep apnea    no cpap, patient denies   Thiamine  deficiency    Wears reading eyeglasses    WPW (Wolff-Parkinson-White syndrome) 02/05/2019   Past Surgical History:  Procedure Laterality Date   BACK SURGERY  04/28/2022   CATARACT EXTRACTION, BILATERAL  12/2011   Dr Gennie Kicks   COLONOSCOPY  2017   COLONOSCOPY W/ POLYPECTOMY  07/03/2010   2 adenomas, diverticulosis on right. Dr Willy Harvest   CYSTOSCOPY N/A 03/15/2018   Procedure: CYSTOSCOPY FLEXIBLE;  Surgeon: Samson Croak,  MD;  Location: [facility];  Service: Urology;  Laterality: N/A;  NO SEEDS FOUND IN BLADDER   ESOPHAGOGASTRODUODENOSCOPY (EGD) WITH PROPOFOL  N/A 01/08/2016   Procedure: ESOPHAGOGASTRODUODENOSCOPY (EGD) WITH PROPOFOL ;  Surgeon: Asencion Blacksmith, MD;  Location: [facility];  Service: Endoscopy;  Laterality: N/A;   FLEXIBLE SIGMOIDOSCOPY  2000   IR EXCHANGE BILIARY DRAIN  02/17/2023   IR PATIENT EVAL TECH 0-60 MINS  12/26/2022   IR PERC CHOLECYSTOSTOMY  12/05/2022   IR PERC CHOLECYSTOSTOMY  12/09/2022   IR RADIOLOGIST EVAL & MGMT  01/16/2023   RADIOACTIVE SEED IMPLANT N/A  03/15/2018   Procedure: RADIOACTIVE SEED IMPLANT/BRACHYTHERAPY IMPLANT;  Surgeon: Samson Croak, MD;  Location: [facility];  Service: Urology;  Laterality: N/A;   41 SEEDS IMPLANTED   RADIOLOGY WITH ANESTHESIA N/A 12/09/2022   Procedure: IR WITH ANESTHESIA;  Surgeon: Radiologist, Medication, MD;  Location: [facility];  Service: Radiology;  Laterality: N/A;   SIGMOIDOSCOPY     SPACE OAR INSTILLATION N/A 03/15/2018   Procedure: SPACE OAR INSTILLATION;  Surgeon: Samson Croak, MD;  Location: [facility];  Service: Urology;  Laterality: N/A;   WISDOM TOOTH EXTRACTION     Social History:  reports that he quit smoking about 52 years ago. His smoking use included cigarettes. He started smoking about 58 years ago. He has a 12 pack-year smoking history. He has never been exposed to tobacco smoke. He has never used smokeless tobacco. He reports that he does not currently use alcohol. He reports that he does not use drugs.  Allergies  Allergen Reactions   Rifaximin  Nausea And Vomiting    Required EMS visit, although patient did not go to hospital   Beta Adrenergic Blockers     Likely WPW.  Use with caution   Calcium  Channel Blockers     Likely WPW.  Use with caution.    Family History  Problem Relation Age of Onset   Lung cancer Mother        smoker   Leukemia Father        Acute myelocytic   Diabetes Neg Hx    Stroke Neg Hx    Heart disease Neg Hx    Colon cancer Neg Hx    Colon polyps Neg Hx    Esophageal cancer Neg Hx    Rectal cancer Neg Hx    Stomach cancer Neg Hx      Prior to Admission medications   Medication Sig Start Date End Date Taking? Authorizing Provider  gabapentin  (NEURONTIN ) 300 MG capsule Take 1 capsule (300 mg total) by mouth 3 (three) times daily. 05/01/23   [name], MD  insulin  glargine, 2 Unit Dial, (TOUJEO  MAX SOLOSTAR) 300 UNIT/ML Solostar Pen Inject 12 Units into the skin daily. Via SANOFI pt assistance 05/01/23   [name], MD  Insulin  Pen Needle (BD PEN NEEDLE MINI ULTRAFINE) 31G X 5 MM MISC INJECT 1 ACT INTO THE SKIN DAILY. USE TO ADMINISTER INSULIN . DX E11.9   Strength: 31G X 5 MM 06/30/23   [name], MD  metFORMIN  (GLUCOPHAGE ) 500 MG tablet TAKE 1 TABLET BY MOUTH TWICE A DAY WITH FOOD 04/23/23   [name], MD  Multiple Vitamins-Minerals (CENTRUM SILVER 50+MEN) TABS Take 1 tablet by mouth daily.    [provider]  pantoprazole  (PROTONIX ) 40 MG tablet TAKE 1 TABLET BY MOUTH 2 TIMES DAILY 30 MINUTES BEFORE MEALS (BREAKFAST AND SUPPER) 06/30/23  [name], MD    Physical Exam: BP (!) 150/68   Pulse (!) 113   Temp (!) 100.4 F (38 C) (Oral)   Resp 16   Ht 6\' 2"  (1.88 m)   Wt 99.3 kg   SpO2 100%   BMI 28.11 kg/m  General: Pleasant, well-appearing acutely ill-appearing laying in bed. No acute distress. HEENT: [place]/AT. Anicteric sclera CV: Tachycardic. Regular rhythm. No murmurs, rubs, or gallops. No LE edema Pulmonary: Lungs CTAB. Normal effort. No wheezing or rales. Abdominal: Soft, nondistended. Mild ttp of the right abdomen, RLQ >RUQ. Small tender mass in the RLQ. Normal bowel sounds. MSK: Mild ttp of the right lower back. Normal ROM. Palpable distal pulses. Skin: Warm and dry. No obvious rash or lesions. Neuro: A&Ox3. Moves all extremities. Normal sensation to light touch. No focal deficit. Psych: Normal mood and affect          Labs on Admission:  Basic Metabolic Panel: Recent Labs  Lab 07/09/23 0954 07/09/23 1733  NA 136 133*  K 4.6 4.3  CL 96 98  CO2 27 25  GLUCOSE 191* 226*  BUN 15 18  CREATININE 1.00 1.02  CALCIUM  9.1 8.6*   Liver Function Tests: Recent Labs  Lab 07/09/23 0954 07/09/23 1733  AST 12 17  ALT 10 13  ALKPHOS 109 103  BILITOT 1.4* 1.4*  PROT 6.5 6.3*  ALBUMIN  3.3* 2.6*   Recent Labs  Lab 07/09/23 1733  LIPASE 20   Recent Labs  Lab 07/09/23 0954  AMMONIA 39*   CBC: Recent Labs  Lab 07/09/23 0954 07/09/23 1425   WBC 15.3* 15.7*  NEUTROABS 11.6*  --   HGB 11.3* 10.7*  HCT 33.2* 32.1*  MCV 88.0 90.7  PLT 226.0 191   Cardiac Enzymes: No results for input(s): "CKTOTAL", "CKMB", "CKMBINDEX", "TROPONINI" in the last 168 hours. BNP (last 3 results) Recent Labs    12/08/22 0452  BNP 623.7*    ProBNP (last 3 results) No results for input(s): "PROBNP" in the last 8760 hours.  CBG: No results for input(s): "GLUCAP" in the last 168 hours.  Radiological Exams on Admission: CT ABDOMEN PELVIS W CONTRAST Result Date: 07/09/2023 CLINICAL DATA:  Bowel obstruction suspected. Abdominal mass. Alcoholic cirrhosis. EXAM: CT ABDOMEN AND PELVIS WITH CONTRAST TECHNIQUE: Multidetector CT imaging of the abdomen and pelvis was performed using the standard protocol following bolus administration of intravenous contrast. RADIATION DOSE REDUCTION: This exam was performed according to the departmental dose-optimization program which includes automated exposure control, adjustment of the mA and/or kV according to patient size and/or use of iterative reconstruction technique. CONTRAST:  OMNIPAQUE  IOHEXOL  300 MG/ML  SOLN COMPARISON:  CT scan abdomen and pelvis from 12/03/2022. FINDINGS: Lower chest: The lung bases are clear. No pleural effusion. The heart is normal in size. No pericardial effusion. Hepatobiliary: The liver is normal in size. There is mild liver surface irregularity/nodularity, compatible with cirrhosis. No suspicious mass. Note is made of 6.2 x 6.4 cm cyst in the right hepatic lobe, which is stable since the prior study. No intrahepatic or extrahepatic bile duct dilation. Gallbladder is surgically absent. Pancreas: Unremarkable. No pancreatic ductal dilatation or surrounding inflammatory changes. Spleen: Spleen is enlarged measuring upto 9.7 x 16.5 cm orthogonally on coronal plane. No focal lesion. Adrenals/Urinary Tract: Adrenal glands are unremarkable. No suspicious renal mass. There is a 2 mm nonobstructing  calculus in the right kidney interpolar region. No other nephroureterolithiasis or obstructive uropathy on either side. Urinary bladder is under distended,  precluding optimal assessment. However, no large mass or stones identified. No perivesical fat stranding. Stomach/Bowel: No disproportionate dilation of the small or large bowel loops. No evidence of abnormal bowel wall thickening or inflammatory changes. The appendix is unremarkable. There are scattered diverticula throughout the colon, without imaging signs of diverticulitis. Vascular/Lymphatic: There is an approximately 9.5 x 12.7 cm soft tissue attenuation collection in the right lower abdomen. Inferiorly, the collection is inseparable from the right iliopsoas muscle however, the epicenter of the collection is outside the muscle. There are few slightly hyperattenuating area along the posteroinferior aspect of the collection and small nodular slightly hyperattenuating area along the anterosuperior aspect of the collection. There is no discrete wall. Findings favor probable evolving hematoma. Correlate clinically. No abdominal or pelvic lymphadenopathy, by size criteria. No aneurysmal dilation of the major abdominal arteries. There are moderate peripheral atherosclerotic vascular calcifications of the aorta and its major branches. Reproductive: Normal size prostate. Symmetric seminal vesicles. Multiple radiation seeds noted in the prostate. There is partially visualized small-to-moderate right hydrocele. Other: There is a tiny fat containing umbilical hernia. The soft tissues and abdominal wall are otherwise unremarkable. Musculoskeletal: No suspicious osseous lesions. There are moderate multilevel degenerative changes in the visualized spine. IMPRESSION: 1. There is an approximately 9.5 x 12.7 cm soft tissue attenuation collection in the right lower abdomen, as described above. Findings favor probable evolving hematoma. 2. No bowel obstruction. 3. Multiple  other nonacute observations, as described above. Aortic Atherosclerosis (ICD10-I70.0). Electronically Signed   By: Beula Brunswick M.D.   On: 07/09/2023 11:31   DG Chest 2 View Result Date: 07/09/2023 CLINICAL DATA:  Short of breath, fatigue, fever, weight loss EXAM: CHEST - 2 VIEW COMPARISON:  12/08/2022 FINDINGS: Frontal and lateral views of the chest demonstrate an unremarkable cardiac silhouette. No acute airspace disease, effusion, or pneumothorax. No acute bony abnormalities. IMPRESSION: 1. No acute intrathoracic process. Electronically Signed   By: Bobbye Burrow M.D.   On: 07/09/2023 10:38   Assessment/Plan DAVARIOUS TUMBLESON is a 78 y.o. male with medical history significant for CLL, prostate cancer, BPH, alcoholic cirrhosis, peripheral neuropathy, T2DM, osteoarthritis and chronic back pain who presented to the ED for management of abdominal hematoma and admitted for intra-abdominal bleeding.  # Intra-abdominal hematoma - Pt with history of alcoholic cirrhosis and a recent fall found to have an 9.5 x 12.7 cm intra-abdominal hematoma on imaging by his outpatient GI office. - Hemoglobin of 10.7 on admission, slowly dropping from 13.8, repeat stable at 10.5 - IR consulted for evaluation, recommends 24-hour observation and vitamin K, available overnight if there is a concern for significant brisk bleed - Give oral vitamin K 2.5 mg for INR of 1.5 - Trend CBC PT/INR  # Sepsis of unknown cause # SIRS - Patient reports 3 to 4 weeks of night sweats, low energy, intermittent fevers and chills with poor appetite - Met SIRS criteria with tachycardia, fever and leukocytosis but currently no source of infection identified - No evidence of ascites on abdominal imaging to indicate SBP - Continue broad-spectrum IV antibiotics with vancomycin  and cefepime  and follow-up blood culture - Trend CBC, fever curve  # Alcoholic cirrhosis - Child-Pugh class B, MELD sodium score of 16, indicating 6% 42-month  mortality  - Follows closely with [facility] - LFTs within normal limits with slightly elevated bilirubin and INR - No evidence of ascites on abdominal imaging - Follow-up with GI in the outpatient  # T2DM - Last A1c 6.8% 2 months ago -  Home regimen includes Toujeo  12 units daily and metformin  500 mg twice daily - Blood glucose of 226 on CMP - Continue on insulin  glargine 12 units daily - SSI with meals, CBG monitoring  # Pseudohyponatremia - Sodium slightly down to 133, corrects to 135 for elevated blood glucose - Trend renal function  # Peripheral neuropathy - Likely secondary to above EtOH and diabetes - Continue gabapentin   # Low back pain - Reports falling on his back 2 weeks ago. - Mild tenderness to palpation of the right lower back - CT L-spine shows moderate central canal stenosis at multiple levels of the L-spine but no acute fracture or traumatic subluxation - Apply lidocaine  patch to lower right back - As needed Robaxin and IV Dilaudid   # GERD - Continue Protonix   DVT prophylaxis: SCDs    Code Status: Full Code  Consults called: IR  Family Communication: Discussed admission with spouse at bedside  Severity of Illness: The appropriate patient status for this patient is OBSERVATION. Observation status is judged to be reasonable and necessary in order to provide the required intensity of service to ensure the patient's safety. The patient's presenting symptoms, physical exam findings, and initial radiographic and laboratory data in the context of their medical condition is felt to place them at decreased risk for further clinical deterioration. Furthermore, it is anticipated that the patient will be medically stable for discharge from the hospital within 2 midnights of admission.   Level of care: Telemetry   This record has been created using Conservation officer, historic buildings. Errors have been sought and corrected, but may not always be located. Such creation  errors do not reflect on the standard of care.   Vita Grip, MD 07/09/2023, 6:45 PM [facility] Pager: [phone] Isaiah 41:10   If 7PM-7AM, please contact night-coverage [email or web address] Password [email or web address]

## 2023-07-09 NOTE — Telephone Encounter (Signed)
 78 year old male with history of cirrhosis, CLL, portal hypertension, thrombocytopenia, recent  cholecystectomy February 14 of this year presents to the office with lower abdominal pain, fevers, nausea, weight loss, decreased appetite for 3 to 4 weeks.  Patient did sustain a fall 2 weeks prior.  On examination patient had right lower abdominal mass, from a cirrhotic standpoint appeared stable. Got stat CT which showed possible hematoma, patient is also been having fevers and has leukocytosis with elevated platelets when normally he has thrombocytopenia. 2 g drop of hemoglobin. Some concern for hematoma versus abscess versus infected hematoma with labs and fevers. Referred to the ER for evaluation and monitoring. INR was also 1.5 patient increased risk for for continuing bleeding.

## 2023-07-09 NOTE — ED Provider Notes (Signed)
 [facility] Provider Note  CSN: [id number] Arrival date & time: 07/09/23 1349  Chief Complaint(s) Mass  HPI Patrick Kidd is a 78 y.o. male with PMH CLL, prostate cancer, alcoholic cirrhosis, T2DM who presents emergency room for evaluation of a fall.  Patient states that about 2 weeks ago he suffered a fall and persistent back pain and flank pain.  States that over the last 3 to 4 weeks she has also had intermittent nighttime fevers and night sweats.  He was seen by his gastroenterology team who ordered an outpatient CT that showed a large intra-abdominal hematoma and he was transferred to the emergency department for further evaluation.  Here in emergency room, patient endorsing pain near the right hip at the site of the hematoma but is denying chest pain, shortness of breath, headache, or other systemic symptoms.   Past Medical History Past Medical History:  Diagnosis Date   Acrophobia    Alcoholic cirrhosis (HCC) 10/04/2015   Anemia    Arthritis    foot by big toe   BPH associated with nocturia    Cataract    removed both eyes   Cholecystitis 05/2022   tx Abx   Chronic cough    PMH of   CLL (chronic lymphocytic leukemia) (HCC)    COVID-19 12/2018   Diabetes mellitus without complication (HCC)    Diverticulosis 07/03/2010   Colonoscopy.    Duodenal ulcer 2017   Fallen arches    Bilateral   GERD (gastroesophageal reflux disease)    Gout    Granuloma annulare    Hx of adenomatous colonic polyps multiple   Hydrocele 2011   Large septated right hydrocele   Liver cyst    Liver lesion    Nonspecific elevation of levels of transaminase or lactic acid dehydrogenase (LDH)    Obesity    Peripheral neuropathy    Plantar fasciitis    PMH of   Portal hypertension (HCC) 2017   Prostate cancer (HCC)    Right shoulder pain 11/2017   Sleep apnea    no cpap, patient denies   Thiamine  deficiency    Wears reading eyeglasses    WPW (Wolff-Parkinson-White  syndrome) 02/05/2019   Patient Active Problem List   Diagnosis Date Noted   Intra-abdominal bleeding 07/09/2023   Fever 07/09/2023   SIRS (systemic inflammatory response syndrome) (HCC) 07/09/2023   Hematoma 07/09/2023   Acute right-sided low back pain without sciatica 07/09/2023   OSA (obstructive sleep apnea) 03/17/2023   Thrombocytopenia (HCC) 03/17/2023   Urinary incontinence 06/25/2021   Degenerative lumbar spinal stenosis 04/30/2021   Degenerative arthritis of knee, bilateral 03/27/2021   Chronic lymphocytic leukemia (HCC) 11/08/2020   Insulin -requiring or dependent type II diabetes mellitus (HCC) 09/25/2020   Thiamine  deficiency    Paroxysmal atrial fibrillation (HCC) 07/19/2019   Allergic rhinitis 06/09/2019   Erectile dysfunction due to diabetes mellitus (HCC) 06/09/2019   Overweight with body mass index (BMI) of 28 to 28.9 in adult 03/17/2019   WPW (Wolff-Parkinson-White syndrome) 02/05/2019   Encephalopathy, hepatic (HCC)    Degenerative disc disease, cervical 07/21/2018   Cervical radiculopathy 07/16/2018   OAB (overactive bladder) 07/06/2018   Malignant neoplasm of prostate (HCC) 01/13/2018   Hyperlipidemia LDL goal <100 11/11/2017   Essential hypertension 11/11/2017   BPH associated with nocturia 11/11/2017   Erectile dysfunction due to arterial insufficiency 04/07/2017   Peripheral vascular disease (HCC) 06/25/2016   Alcoholic cirrhosis (HCC) 10/04/2015   Type 2 diabetes mellitus with  complication, with long-term current use of insulin  (HCC) 08/22/2015   Hypersomnolence 03/19/2014   Peripheral neuropathy 01/11/2013   Polyclonal gammopathy 01/11/2013   GERD 10/04/2009   Benign prostatic hyperplasia without lower urinary tract symptoms 02/04/1999   Gout, unspecified 02/04/1999   Portal hypertension (HCC) 02/04/1999   Home Medication(s) Prior to Admission medications   Medication Sig Start Date End Date Taking? Authorizing Provider  gabapentin  (NEURONTIN ) 300  MG capsule Take 1 capsule (300 mg total) by mouth 3 (three) times daily. 05/01/23   [name], MD  insulin  glargine, 2 Unit Dial, (TOUJEO  MAX SOLOSTAR) 300 UNIT/ML Solostar Pen Inject 12 Units into the skin daily. Via SANOFI pt assistance 05/01/23   [name], MD  Insulin  Pen Needle (BD PEN NEEDLE MINI ULTRAFINE) 31G X 5 MM MISC INJECT 1 ACT INTO THE SKIN DAILY. USE TO ADMINISTER INSULIN . DX E11.9   Strength: 31G X 5 MM 06/30/23   [name], MD  metFORMIN  (GLUCOPHAGE ) 500 MG tablet TAKE 1 TABLET BY MOUTH TWICE A DAY WITH FOOD 04/23/23   [name], MD  Multiple Vitamins-Minerals (CENTRUM SILVER 50+MEN) TABS Take 1 tablet by mouth daily.    [provider]  pantoprazole  (PROTONIX ) 40 MG tablet TAKE 1 TABLET BY MOUTH 2 TIMES DAILY 30 MINUTES BEFORE MEALS (BREAKFAST AND SUPPER) 06/30/23   [name], MD                                                                                                                                    Past Surgical History Past Surgical History:  Procedure Laterality Date   BACK SURGERY  04/28/2022   CATARACT EXTRACTION, BILATERAL  12/2011   Dr Gennie Kicks   COLONOSCOPY  2017   COLONOSCOPY W/ POLYPECTOMY  07/03/2010   2 adenomas, diverticulosis on right. Dr Willy Harvest   CYSTOSCOPY N/A 03/15/2018   Procedure: Ardith Bedford;  Surgeon: Samson Croak, MD;  Location: [facility];  Service: Urology;  Laterality: N/A;  NO SEEDS FOUND IN BLADDER   ESOPHAGOGASTRODUODENOSCOPY (EGD) WITH PROPOFOL  N/A 01/08/2016   Procedure: ESOPHAGOGASTRODUODENOSCOPY (EGD) WITH PROPOFOL ;  Surgeon: Asencion Blacksmith, MD;  Location: [facility];  Service: Endoscopy;  Laterality: N/A;   FLEXIBLE SIGMOIDOSCOPY  2000   IR EXCHANGE BILIARY DRAIN  02/17/2023   IR PATIENT EVAL TECH 0-60 MINS  12/26/2022   IR PERC CHOLECYSTOSTOMY  12/05/2022   IR PERC CHOLECYSTOSTOMY  12/09/2022   IR RADIOLOGIST EVAL & MGMT  01/16/2023   RADIOACTIVE SEED IMPLANT N/A  03/15/2018   Procedure: RADIOACTIVE SEED IMPLANT/BRACHYTHERAPY IMPLANT;  Surgeon: Samson Croak, MD;  Location: [facility];  Service: Urology;  Laterality: N/A;   97 SEEDS IMPLANTED   RADIOLOGY WITH ANESTHESIA N/A 12/09/2022   Procedure: IR WITH ANESTHESIA;  Surgeon: Radiologist, Medication, MD;  Location: [facility];  Service: Radiology;  Laterality: N/A;   SIGMOIDOSCOPY  SPACE OAR INSTILLATION N/A 03/15/2018   Procedure: SPACE OAR INSTILLATION;  Surgeon: Samson Croak, MD;  Location: [facility];  Service: Urology;  Laterality: N/A;   WISDOM TOOTH EXTRACTION     Family History Family History  Problem Relation Age of Onset   Lung cancer Mother        smoker   Leukemia Father        Acute myelocytic   Diabetes Neg Hx    Stroke Neg Hx    Heart disease Neg Hx    Colon cancer Neg Hx    Colon polyps Neg Hx    Esophageal cancer Neg Hx    Rectal cancer Neg Hx    Stomach cancer Neg Hx     Social History Social History   Tobacco Use   Smoking status: Former    Current packs/day: 0.00    Average packs/day: 2.0 packs/day for 6.0 years (12.0 ttl pk-yrs)    Types: Cigarettes    Start date: 02/03/1965    Quit date: 02/04/1971    Years since quitting: 52.4    Passive exposure: Never   Smokeless tobacco: Never   Tobacco comments:    smoked age [age], up to 2 ppd  Vaping Use   Vaping status: Never Used  Substance Use Topics   Alcohol use: Not Currently    Comment: No alochol since Christmas Day 2020   Drug use: No   Allergies Rifaximin , Beta adrenergic blockers, and Calcium  channel blockers  Review of Systems Review of Systems  Constitutional:  Positive for diaphoresis and fever.  Gastrointestinal:  Positive for abdominal pain.  Musculoskeletal:  Positive for back pain.    Physical Exam Vital Signs  I have reviewed the triage vital signs BP 121/60 (BP Location: Left Arm)   Pulse (!) 118   Temp 100.1 F (37.8 C) (Oral)   Resp 16   Ht  6\' 2"  (1.88 m)   Wt 99.3 kg   SpO2 92%   BMI 28.11 kg/m   Physical Exam Constitutional:      General: He is not in acute distress.    Appearance: Normal appearance.  HENT:     Head: Normocephalic and atraumatic.     Nose: No congestion or rhinorrhea.  Eyes:     General:        Right eye: No discharge.        Left eye: No discharge.     Extraocular Movements: Extraocular movements intact.     Pupils: Pupils are equal, round, and reactive to light.  Cardiovascular:     Rate and Rhythm: Regular rhythm. Tachycardia present.     Heart sounds: No murmur heard. Pulmonary:     Effort: No respiratory distress.     Breath sounds: No wheezing or rales.  Abdominal:     General: There is no distension.     Tenderness: There is abdominal tenderness.  Musculoskeletal:        General: Normal range of motion.     Cervical back: Normal range of motion.  Skin:    General: Skin is warm and dry.  Neurological:     General: No focal deficit present.     Mental Status: He is alert.     ED Results and Treatments Labs (all labs ordered are listed, but only abnormal results are displayed) Labs Reviewed  CBC - Abnormal; Notable for the following components:      Result Value   WBC 15.7 (*)  RBC 3.54 (*)    Hemoglobin 10.7 (*)    HCT 32.1 (*)    All other components within normal limits  URINALYSIS, ROUTINE W REFLEX MICROSCOPIC - Abnormal; Notable for the following components:   Specific Gravity, Urine >1.046 (*)    Ketones, ur 5 (*)    Protein, ur 30 (*)    All other components within normal limits  COMPREHENSIVE METABOLIC PANEL WITH GFR - Abnormal; Notable for the following components:   Sodium 133 (*)    Glucose, Bld 226 (*)    Calcium  8.6 (*)    Total Protein 6.3 (*)    Albumin  2.6 (*)    Total Bilirubin 1.4 (*)    All other components within normal limits  CBC - Abnormal; Notable for the following components:   WBC 14.2 (*)    RBC 3.47 (*)    Hemoglobin 10.5 (*)    HCT  31.2 (*)    All other components within normal limits  CULTURE, BLOOD (ROUTINE X 2)  CULTURE, BLOOD (ROUTINE X 2)  LIPASE, BLOOD  CBC  PROTIME-INR  COMPREHENSIVE METABOLIC PANEL WITH GFR  I-STAT CG4 LACTIC ACID, ED  I-STAT CG4 LACTIC ACID, ED                                                                                                                          Radiology CT L-SPINE NO CHARGE Result Date: 07/09/2023 CLINICAL DATA:  Right-sided abdominal pain EXAM: CT LUMBAR SPINE WITHOUT CONTRAST TECHNIQUE: Multidetector CT imaging of the lumbar spine was performed without intravenous contrast administration. Multiplanar CT image reconstructions were also generated. RADIATION DOSE REDUCTION: This exam was performed according to the departmental dose-optimization program which includes automated exposure control, adjustment of the mA and/or kV according to patient size and/or use of iterative reconstruction technique. COMPARISON:  CT abdomen and pelvis same day FINDINGS: Segmentation: 5 lumbar type vertebrae. Alignment: Normal. Vertebrae: No acute fracture or focal pathologic process. There is chronic appearing fragmentation of the right L2 transverse process which may be related to old injury. There is a healed left L2 transverse process fracture. Transverse process Paraspinal and other soft tissues: Negative. Disc levels: There is mild disc space narrowing and endplate osteophyte formation throughout the lumbar spine compatible with degenerative change. T12-L1: No central canal or neural foraminal stenosis. L1-L2: There is bilateral facet arthropathy. No central canal or neural foraminal stenosis. L2-L3: There is bilateral facet arthropathy and thickening of the ligamentum flavum. Mild disc bulge present. There is moderate central canal stenosis. L3-L4: There is bilateral facet arthropathy with thickening of the ligamentum flavum and broad-based disc bulge. There is moderate central canal stenosis.  There is mild right neural foraminal stenosis. L4-L5: There is bilateral facet arthropathy with thickening of the ligamentum flavum and broad-based disc bulge. There is moderate central canal stenosis. There is mild bilateral neural foraminal stenosis. L5-S1: There is bilateral facet arthropathy. There is mild right neural foraminal stenosis. No central canal stenosis.  Other: Stranding is seen along the intramuscular right pelvic sidewall. Please see CT of the abdomen and pelvis for further description. IMPRESSION: 1. No acute fracture or traumatic subluxation of the lumbar spine. 2. Multilevel degenerative changes of the lumbar spine with moderate central canal stenosis at L2-L3, L3-L4, and L4-L5. 3. Mild neural foraminal stenosis at L3-L4, L4-L5, and L5-S1. 4. Stranding along the intramuscular right pelvic sidewall. Please see CT of the abdomen and pelvis for further description. Electronically Signed   By: Tyron Gallon M.D.   On: 07/09/2023 20:18   CT ABDOMEN PELVIS W CONTRAST Result Date: 07/09/2023 CLINICAL DATA:  Bowel obstruction suspected. Abdominal mass. Alcoholic cirrhosis. EXAM: CT ABDOMEN AND PELVIS WITH CONTRAST TECHNIQUE: Multidetector CT imaging of the abdomen and pelvis was performed using the standard protocol following bolus administration of intravenous contrast. RADIATION DOSE REDUCTION: This exam was performed according to the departmental dose-optimization program which includes automated exposure control, adjustment of the mA and/or kV according to patient size and/or use of iterative reconstruction technique. CONTRAST:  OMNIPAQUE  IOHEXOL  300 MG/ML  SOLN COMPARISON:  CT scan abdomen and pelvis from 12/03/2022. FINDINGS: Lower chest: The lung bases are clear. No pleural effusion. The heart is normal in size. No pericardial effusion. Hepatobiliary: The liver is normal in size. There is mild liver surface irregularity/nodularity, compatible with cirrhosis. No suspicious mass. Note is  made of 6.2 x 6.4 cm cyst in the right hepatic lobe, which is stable since the prior study. No intrahepatic or extrahepatic bile duct dilation. Gallbladder is surgically absent. Pancreas: Unremarkable. No pancreatic ductal dilatation or surrounding inflammatory changes. Spleen: Spleen is enlarged measuring upto 9.7 x 16.5 cm orthogonally on coronal plane. No focal lesion. Adrenals/Urinary Tract: Adrenal glands are unremarkable. No suspicious renal mass. There is a 2 mm nonobstructing calculus in the right kidney interpolar region. No other nephroureterolithiasis or obstructive uropathy on either side. Urinary bladder is under distended, precluding optimal assessment. However, no large mass or stones identified. No perivesical fat stranding. Stomach/Bowel: No disproportionate dilation of the small or large bowel loops. No evidence of abnormal bowel wall thickening or inflammatory changes. The appendix is unremarkable. There are scattered diverticula throughout the colon, without imaging signs of diverticulitis. Vascular/Lymphatic: There is an approximately 9.5 x 12.7 cm soft tissue attenuation collection in the right lower abdomen. Inferiorly, the collection is inseparable from the right iliopsoas muscle however, the epicenter of the collection is outside the muscle. There are few slightly hyperattenuating area along the posteroinferior aspect of the collection and small nodular slightly hyperattenuating area along the anterosuperior aspect of the collection. There is no discrete wall. Findings favor probable evolving hematoma. Correlate clinically. No abdominal or pelvic lymphadenopathy, by size criteria. No aneurysmal dilation of the major abdominal arteries. There are moderate peripheral atherosclerotic vascular calcifications of the aorta and its major branches. Reproductive: Normal size prostate. Symmetric seminal vesicles. Multiple radiation seeds noted in the prostate. There is partially visualized  small-to-moderate right hydrocele. Other: There is a tiny fat containing umbilical hernia. The soft tissues and abdominal wall are otherwise unremarkable. Musculoskeletal: No suspicious osseous lesions. There are moderate multilevel degenerative changes in the visualized spine. IMPRESSION: 1. There is an approximately 9.5 x 12.7 cm soft tissue attenuation collection in the right lower abdomen, as described above. Findings favor probable evolving hematoma. 2. No bowel obstruction. 3. Multiple other nonacute observations, as described above. Aortic Atherosclerosis (ICD10-I70.0). Electronically Signed   By: Beula Brunswick M.D.   On: 07/09/2023 11:31  DG Chest 2 View Result Date: 07/09/2023 CLINICAL DATA:  Short of breath, fatigue, fever, weight loss EXAM: CHEST - 2 VIEW COMPARISON:  12/08/2022 FINDINGS: Frontal and lateral views of the chest demonstrate an unremarkable cardiac silhouette. No acute airspace disease, effusion, or pneumothorax. No acute bony abnormalities. IMPRESSION: 1. No acute intrathoracic process. Electronically Signed   By: Bobbye Burrow M.D.   On: 07/09/2023 10:38    Pertinent labs & imaging results that were available during my care of the patient were reviewed by me and considered in my medical decision making (see MDM for details).  Medications Ordered in ED Medications  iohexol  (OMNIPAQUE ) 300 MG/ML solution 100 mL (has no administration in time range)  pantoprazole  (PROTONIX ) EC tablet 40 mg (40 mg Oral Given 07/09/23 2116)  gabapentin  (NEURONTIN ) capsule 300 mg (300 mg Oral Given 07/09/23 2116)  multivitamin with minerals tablet 1 tablet (1 tablet Oral Given 07/09/23 1954)  acetaminophen  (TYLENOL ) tablet 650 mg (has no administration in time range)    Or  acetaminophen  (TYLENOL ) suppository 650 mg (has no administration in time range)  senna-docusate (Senokot-S) tablet 1 tablet (has no administration in time range)  ondansetron  (ZOFRAN ) tablet 4 mg (has no administration in time  range)    Or  ondansetron  (ZOFRAN ) injection 4 mg (has no administration in time range)  feeding supplement (BOOST / RESOURCE BREEZE) liquid 1 Container (1 Container Oral Given 07/09/23 2116)  HYDROmorphone  (DILAUDID ) injection 0.5 mg (0.5 mg Intravenous Given 07/09/23 2117)  benzonatate  (TESSALON ) capsule 100 mg (100 mg Oral Given 07/09/23 2355)  insulin  aspart (novoLOG ) injection 0-15 Units (has no administration in time range)  insulin  aspart (novoLOG ) injection 0-5 Units ( Subcutaneous Not Given 07/09/23 2204)  methocarbamol (ROBAXIN) tablet 750 mg (750 mg Oral Given 07/09/23 2355)  lidocaine  (LIDODERM ) 5 % 1 patch (1 patch Transdermal Patch Applied 07/09/23 2358)  insulin  glargine-yfgn (SEMGLEE ) injection 12 Units (has no administration in time range)  ceFEPIme  (MAXIPIME ) 2 g in sodium chloride  0.9 % 100 mL IVPB (has no administration in time range)  vancomycin  (VANCOREADY) IVPB 2000 mg/400 mL (has no administration in time range)  morphine  (PF) 4 MG/ML injection 4 mg (4 mg Intravenous Given 07/09/23 1714)  ondansetron  (ZOFRAN ) injection 4 mg (4 mg Intravenous Given 07/09/23 1714)  vancomycin  (VANCOREADY) IVPB 2000 mg/400 mL (2,000 mg Intravenous New Bag/Given 07/09/23 1952)  ceFEPIme  (MAXIPIME ) 2 g in sodium chloride  0.9 % 100 mL IVPB (2 g Intravenous New Bag/Given 07/09/23 1954)  phytonadione  (VITAMIN K) tablet 2.5 mg (2.5 mg Oral Given 07/09/23 2201)  sodium chloride  0.9 % bolus 1,000 mL (1,000 mLs Intravenous New Bag/Given 07/09/23 2203)                                                                                                                                     Procedures .Critical Care  Performed by: Karlyn Overman, MD Authorized by: Karlyn Overman, MD  Critical care provider statement:    Critical care time (minutes):  30   Critical care was time spent personally by me on the following activities:  Development of treatment plan with patient or surrogate, discussions with consultants,  evaluation of patient's response to treatment, examination of patient, ordering and review of laboratory studies, ordering and review of radiographic studies, ordering and performing treatments and interventions, pulse oximetry, re-evaluation of patient's condition and review of old charts   (including critical care time)  Medical Decision Making / ED Course   This patient presents to the ED for concern of abnormal CT, abdominal pain, fever, this involves an extensive number of treatment options, and is a complaint that carries with it a high risk of complications and morbidity.  The differential diagnosis includes hematoma, blood loss anemia, bacteremia, malignancy, coagulopathy  MDM: Patient seen emergency room for evaluation of abnormal outpatient CT, fevers, abdominal and back pain.  Physical exam with tenderness in the right lower quadrant and right flank near the site of the known hematoma in question.  Laboratory evaluation with a leukocytosis to 15.7, hemoglobin 10.7 which is only slightly downtrending from earlier in the morning, albumin  2.6 but is otherwise unremarkable.  Lactic acid is normal.  My reevaluation, patient is feeling febrile and temperature noted to be 100.4.  Blood cultures obtained and patient started on antibiotics as he technically does meet SIRS criteria.  I spoke with the interventional radiologist on-call Dr. Abbey Abbe who is not recommending arterial phase imaging at this time and instead is recommending observation with oral vitamin K to correct coagulopathy.  Interventional radiology would intervene if it became hemodynamically unstable but at this time we will opt for observation.  Patient admitted to the hospitalist.   Additional history obtained: -Additional history obtained from wife -External records from outside source obtained and reviewed including: Chart review including previous notes, labs, imaging, consultation notes   Lab Tests: -I ordered, reviewed,  and interpreted labs.   The pertinent results include:   Labs Reviewed  CBC - Abnormal; Notable for the following components:      Result Value   WBC 15.7 (*)    RBC 3.54 (*)    Hemoglobin 10.7 (*)    HCT 32.1 (*)    All other components within normal limits  URINALYSIS, ROUTINE W REFLEX MICROSCOPIC - Abnormal; Notable for the following components:   Specific Gravity, Urine >1.046 (*)    Ketones, ur 5 (*)    Protein, ur 30 (*)    All other components within normal limits  COMPREHENSIVE METABOLIC PANEL WITH GFR - Abnormal; Notable for the following components:   Sodium 133 (*)    Glucose, Bld 226 (*)    Calcium  8.6 (*)    Total Protein 6.3 (*)    Albumin  2.6 (*)    Total Bilirubin 1.4 (*)    All other components within normal limits  CBC - Abnormal; Notable for the following components:   WBC 14.2 (*)    RBC 3.47 (*)    Hemoglobin 10.5 (*)    HCT 31.2 (*)    All other components within normal limits  CULTURE, BLOOD (ROUTINE X 2)  CULTURE, BLOOD (ROUTINE X 2)  LIPASE, BLOOD  CBC  PROTIME-INR  COMPREHENSIVE METABOLIC PANEL WITH GFR  I-STAT CG4 LACTIC ACID, ED  I-STAT CG4 LACTIC ACID, ED      Medicines ordered and prescription drug management: Meds ordered this encounter  Medications   iohexol  (OMNIPAQUE ) 300 MG/ML solution  100 mL   morphine  (PF) 4 MG/ML injection 4 mg   ondansetron  (ZOFRAN ) injection 4 mg   vancomycin  (VANCOREADY) IVPB 2000 mg/400 mL    Indication::   Sepsis   ceFEPIme  (MAXIPIME ) 2 g in sodium chloride  0.9 % 100 mL IVPB    Antibiotic Indication::   Sepsis   pantoprazole  (PROTONIX ) EC tablet 40 mg   gabapentin  (NEURONTIN ) capsule 300 mg   multivitamin with minerals tablet 1 tablet   DISCONTD: enoxaparin (LOVENOX) injection 40 mg   OR Linked Order Group    acetaminophen  (TYLENOL ) tablet 650 mg    acetaminophen  (TYLENOL ) suppository 650 mg   senna-docusate (Senokot-S) tablet 1 tablet   OR Linked Order Group    ondansetron  (ZOFRAN ) tablet 4 mg     ondansetron  (ZOFRAN ) injection 4 mg   phytonadione  (VITAMIN K) tablet 2.5 mg   feeding supplement (BOOST / RESOURCE BREEZE) liquid 1 Container    Prefers chocolate   HYDROmorphone  (DILAUDID ) injection 0.5 mg   benzonatate  (TESSALON ) capsule 100 mg   insulin  aspart (novoLOG ) injection 0-15 Units    Correction coverage::   Moderate (average weight, post-op)    CBG < 70::   Implement Hypoglycemia Standing Orders and refer to Hypoglycemia Standing Orders sidebar report    CBG 70 - 120::   0 units    CBG 121 - 150::   2 units    CBG 151 - 200::   3 units    CBG 201 - 250::   5 units    CBG 251 - 300::   8 units    CBG 301 - 350::   11 units    CBG 351 - 400::   15 units    CBG > 400:   call MD and obtain STAT lab verification   insulin  aspart (novoLOG ) injection 0-5 Units    Correction coverage::   HS scale    CBG < 70::   Implement Hypoglycemia Standing Orders and refer to Hypoglycemia Standing Orders sidebar report    CBG 70 - 120::   0 units    CBG 121 - 150::   0 units    CBG 151 - 200::   0 units    CBG 201 - 250::   2 units    CBG 251 - 300::   3 units    CBG 301 - 350::   4 units    CBG 351 - 400::   5 units    CBG > 400:   call MD and obtain STAT lab verification   sodium chloride  0.9 % bolus 1,000 mL   methocarbamol (ROBAXIN) tablet 750 mg   lidocaine  (LIDODERM ) 5 % 1 patch   DISCONTD: insulin  glargine (2 Unit Dial) (TOUJEO  MAX) Solostar Pen SOPN 12 Units   insulin  glargine-yfgn (SEMGLEE ) injection 12 Units   ceFEPIme  (MAXIPIME ) 2 g in sodium chloride  0.9 % 100 mL IVPB    Antibiotic Indication::   Sepsis   vancomycin  (VANCOREADY) IVPB 2000 mg/400 mL    Indication::   Sepsis    -I have reviewed the patients home medicines and have made adjustments as needed  Critical interventions Antibiotics, IR elevation  Consultations Obtained: I requested consultation with the IR physician on-call,  and discussed lab and imaging findings as well as pertinent plan - they recommend:  Observation   Cardiac Monitoring: The patient was maintained on a cardiac monitor.  I personally viewed and interpreted the cardiac monitored which showed an underlying rhythm of: NSR, sinus tachycardia  Social Determinants of Health:  Factors impacting patients care include: none   Reevaluation: After the interventions noted above, I reevaluated the patient and found that they have :stayed the same  Co morbidities that complicate the patient evaluation  Past Medical History:  Diagnosis Date   Acrophobia    Alcoholic cirrhosis (HCC) 10/04/2015   Anemia    Arthritis    foot by big toe   BPH associated with nocturia    Cataract    removed both eyes   Cholecystitis 05/2022   tx Abx   Chronic cough    PMH of   CLL (chronic lymphocytic leukemia) (HCC)    COVID-19 12/2018   Diabetes mellitus without complication (HCC)    Diverticulosis 07/03/2010   Colonoscopy.    Duodenal ulcer 2017   Fallen arches    Bilateral   GERD (gastroesophageal reflux disease)    Gout    Granuloma annulare    Hx of adenomatous colonic polyps multiple   Hydrocele 2011   Large septated right hydrocele   Liver cyst    Liver lesion    Nonspecific elevation of levels of transaminase or lactic acid dehydrogenase (LDH)    Obesity    Peripheral neuropathy    Plantar fasciitis    PMH of   Portal hypertension (HCC) 2017   Prostate cancer (HCC)    Right shoulder pain 11/2017   Sleep apnea    no cpap, patient denies   Thiamine  deficiency    Wears reading eyeglasses    WPW (Wolff-Parkinson-White syndrome) 02/05/2019      Dispostion: I considered admission for this patient, and patient require hospital admission for observation in the setting of a intra-abdominal hematoma and unexplained fevers     Final Clinical Impression(s) / ED Diagnoses Final diagnoses:  Hematoma  Fever, unspecified fever cause     @PCDICTATION @    Karlyn Overman, MD 07/10/23 [phone]

## 2023-07-09 NOTE — Progress Notes (Signed)
 Report taken from Cris Dollar, Charity fundraiser.

## 2023-07-09 NOTE — Patient Instructions (Addendum)
 Your provider has requested that you go to the basement level for lab work before leaving today. Press "B" on the elevator. The lab is located at the first door on the left as you exit the elevator.  Your provider has requested that you have an abdominal x ray before leaving today. Please go to the basement floor to our Radiology department for the test.   You are scheduled for your CT Scan NOW at [facility]  Advised to go to the ER if there is any severe abdominal pain, unable to hold down food/water, unable to urinate, blood in stool or vomit, chest pain, shortness of breath, or any worsening symptoms.    Due to recent changes in healthcare laws, you may see the results of your imaging and laboratory studies on MyChart before your provider has had a chance to review them.  We understand that in some cases there may be results that are confusing or concerning to you. Not all laboratory results come back in the same time frame and the provider may be waiting for multiple results in order to interpret others.  Please give us  48 hours in order for your provider to thoroughly review all the results before contacting the office for clarification of your results.   I appreciate the  opportunity to care for you  Thank You   [facility]

## 2023-07-10 DIAGNOSIS — K7030 Alcoholic cirrhosis of liver without ascites: Secondary | ICD-10-CM | POA: Diagnosis present

## 2023-07-10 DIAGNOSIS — K7460 Unspecified cirrhosis of liver: Secondary | ICD-10-CM | POA: Diagnosis not present

## 2023-07-10 DIAGNOSIS — K683 Retroperitoneal hematoma: Secondary | ICD-10-CM | POA: Diagnosis present

## 2023-07-10 DIAGNOSIS — G4733 Obstructive sleep apnea (adult) (pediatric): Secondary | ICD-10-CM | POA: Diagnosis present

## 2023-07-10 DIAGNOSIS — Z8616 Personal history of COVID-19: Secondary | ICD-10-CM | POA: Diagnosis not present

## 2023-07-10 DIAGNOSIS — K6819 Other retroperitoneal abscess: Secondary | ICD-10-CM | POA: Diagnosis present

## 2023-07-10 DIAGNOSIS — W19XXXA Unspecified fall, initial encounter: Secondary | ICD-10-CM | POA: Diagnosis present

## 2023-07-10 DIAGNOSIS — A419 Sepsis, unspecified organism: Secondary | ICD-10-CM | POA: Diagnosis present

## 2023-07-10 DIAGNOSIS — Z79899 Other long term (current) drug therapy: Secondary | ICD-10-CM | POA: Diagnosis not present

## 2023-07-10 DIAGNOSIS — Z87891 Personal history of nicotine dependence: Secondary | ICD-10-CM | POA: Diagnosis not present

## 2023-07-10 DIAGNOSIS — Z9049 Acquired absence of other specified parts of digestive tract: Secondary | ICD-10-CM | POA: Diagnosis not present

## 2023-07-10 DIAGNOSIS — R161 Splenomegaly, not elsewhere classified: Secondary | ICD-10-CM | POA: Diagnosis not present

## 2023-07-10 DIAGNOSIS — T148XXA Other injury of unspecified body region, initial encounter: Secondary | ICD-10-CM | POA: Diagnosis present

## 2023-07-10 DIAGNOSIS — K766 Portal hypertension: Secondary | ICD-10-CM | POA: Diagnosis present

## 2023-07-10 DIAGNOSIS — Z860101 Personal history of adenomatous and serrated colon polyps: Secondary | ICD-10-CM | POA: Diagnosis not present

## 2023-07-10 DIAGNOSIS — K219 Gastro-esophageal reflux disease without esophagitis: Secondary | ICD-10-CM | POA: Diagnosis present

## 2023-07-10 DIAGNOSIS — R6510 Systemic inflammatory response syndrome (SIRS) of non-infectious origin without acute organ dysfunction: Secondary | ICD-10-CM | POA: Diagnosis not present

## 2023-07-10 DIAGNOSIS — D684 Acquired coagulation factor deficiency: Secondary | ICD-10-CM | POA: Diagnosis present

## 2023-07-10 DIAGNOSIS — K7581 Nonalcoholic steatohepatitis (NASH): Secondary | ICD-10-CM | POA: Diagnosis present

## 2023-07-10 DIAGNOSIS — Z8546 Personal history of malignant neoplasm of prostate: Secondary | ICD-10-CM | POA: Diagnosis not present

## 2023-07-10 DIAGNOSIS — B961 Klebsiella pneumoniae [K. pneumoniae] as the cause of diseases classified elsewhere: Secondary | ICD-10-CM | POA: Diagnosis not present

## 2023-07-10 DIAGNOSIS — E1142 Type 2 diabetes mellitus with diabetic polyneuropathy: Secondary | ICD-10-CM | POA: Diagnosis present

## 2023-07-10 DIAGNOSIS — G928 Other toxic encephalopathy: Secondary | ICD-10-CM | POA: Diagnosis not present

## 2023-07-10 DIAGNOSIS — I10 Essential (primary) hypertension: Secondary | ICD-10-CM | POA: Diagnosis present

## 2023-07-10 DIAGNOSIS — K5720 Diverticulitis of large intestine with perforation and abscess without bleeding: Secondary | ICD-10-CM | POA: Diagnosis not present

## 2023-07-10 DIAGNOSIS — E1165 Type 2 diabetes mellitus with hyperglycemia: Secondary | ICD-10-CM | POA: Diagnosis present

## 2023-07-10 DIAGNOSIS — D62 Acute posthemorrhagic anemia: Secondary | ICD-10-CM | POA: Diagnosis present

## 2023-07-10 DIAGNOSIS — Z794 Long term (current) use of insulin: Secondary | ICD-10-CM | POA: Diagnosis not present

## 2023-07-10 DIAGNOSIS — K6811 Postprocedural retroperitoneal abscess: Secondary | ICD-10-CM | POA: Diagnosis not present

## 2023-07-10 DIAGNOSIS — C9110 Chronic lymphocytic leukemia of B-cell type not having achieved remission: Secondary | ICD-10-CM | POA: Diagnosis present

## 2023-07-10 DIAGNOSIS — Z7984 Long term (current) use of oral hypoglycemic drugs: Secondary | ICD-10-CM | POA: Diagnosis not present

## 2023-07-10 DIAGNOSIS — K7689 Other specified diseases of liver: Secondary | ICD-10-CM | POA: Diagnosis not present

## 2023-07-10 DIAGNOSIS — E876 Hypokalemia: Secondary | ICD-10-CM | POA: Diagnosis not present

## 2023-07-10 LAB — COMPREHENSIVE METABOLIC PANEL WITH GFR
ALT: 12 U/L (ref 0–44)
AST: 14 U/L — ABNORMAL LOW (ref 15–41)
Albumin: 2.3 g/dL — ABNORMAL LOW (ref 3.5–5.0)
Alkaline Phosphatase: 94 U/L (ref 38–126)
Anion gap: 8 (ref 5–15)
BUN: 17 mg/dL (ref 8–23)
CO2: 24 mmol/L (ref 22–32)
Calcium: 8.3 mg/dL — ABNORMAL LOW (ref 8.9–10.3)
Chloride: 102 mmol/L (ref 98–111)
Creatinine, Ser: 1.13 mg/dL (ref 0.61–1.24)
GFR, Estimated: >60 mL/min (ref >60)
Glucose, Bld: 226 mg/dL — ABNORMAL HIGH (ref 70–99)
Potassium: 4 mmol/L (ref 3.5–5.1)
Sodium: 134 mmol/L — ABNORMAL LOW (ref 135–145)
Total Bilirubin: 1.1 mg/dL (ref 0.0–1.2)
Total Protein: 5.8 g/dL — ABNORMAL LOW (ref 6.5–8.1)

## 2023-07-10 LAB — RESPIRATORY PANEL BY PCR

## 2023-07-10 LAB — CBC
HCT: 30.7 % — ABNORMAL LOW (ref 39.0–52.0)
Hemoglobin: 9.9 g/dL — ABNORMAL LOW (ref 13.0–17.0)
MCH: 30.6 pg (ref 26.0–34.0)
MCHC: 32.2 g/dL (ref 30.0–36.0)
MCV: 94.8 fL (ref 80.0–100.0)
Platelets: 175 10*3/uL (ref 150–400)
RBC: 3.24 MIL/uL — ABNORMAL LOW (ref 4.22–5.81)
RDW: 12.3 % (ref 11.5–15.5)
WBC: 13.8 10*3/uL — ABNORMAL HIGH (ref 4.0–10.5)
nRBC: 0 % (ref 0.0–0.2)

## 2023-07-10 LAB — RESP PANEL BY RT-PCR (RSV, FLU A&B, COVID)  RVPGX2
Influenza A by PCR: NEGATIVE
Influenza B by PCR: NEGATIVE
Resp Syncytial Virus by PCR: NEGATIVE
SARS Coronavirus 2 by RT PCR: NEGATIVE

## 2023-07-10 LAB — GLUCOSE, CAPILLARY
Glucose-Capillary: 161 mg/dL — ABNORMAL HIGH (ref 70–99)
Glucose-Capillary: 199 mg/dL — ABNORMAL HIGH (ref 70–99)
Glucose-Capillary: 232 mg/dL — ABNORMAL HIGH (ref 70–99)
Glucose-Capillary: 180 mg/dL — ABNORMAL HIGH (ref 70–99)
Glucose-Capillary: 298 mg/dL — ABNORMAL HIGH (ref 70–99)

## 2023-07-10 LAB — HEMOGLOBIN AND HEMATOCRIT, BLOOD
HCT: 28.6 % — ABNORMAL LOW (ref 39.0–52.0)
Hemoglobin: 9.4 g/dL — ABNORMAL LOW (ref 13.0–17.0)

## 2023-07-10 LAB — PROTIME-INR
INR: 1.4 — ABNORMAL HIGH (ref 0.8–1.2)
Prothrombin Time: 17.5 s — ABNORMAL HIGH (ref 11.4–15.2)

## 2023-07-10 MED ORDER — OXYCODONE HCL 5 MG PO TABS
5.0000 mg | ORAL_TABLET | ORAL | Status: DC | PRN
  Administered 2023-07-10 – 2023-07-11 (×3): 10 mg via ORAL
  Filled 2023-07-10 (×3): qty 2

## 2023-07-10 MED ORDER — BENZONATATE 100 MG PO CAPS
100.0000 mg | ORAL_CAPSULE | Freq: Three times a day (TID) | ORAL | Status: DC
  Administered 2023-07-10 – 2023-07-18 (×25): 100 mg via ORAL
  Filled 2023-07-10 (×26): qty 1

## 2023-07-10 MED ORDER — SODIUM CHLORIDE 0.9 % IV SOLN
2.0000 g | Freq: Three times a day (TID) | INTRAVENOUS | Status: DC
  Administered 2023-07-10 – 2023-07-13 (×10): 2 g via INTRAVENOUS
  Filled 2023-07-10 (×10): qty 12.5

## 2023-07-10 MED ORDER — VANCOMYCIN HCL 2000 MG/400ML IV SOLN
2000.0000 mg | INTRAVENOUS | Status: DC
  Administered 2023-07-10 – 2023-07-12 (×3): 2000 mg via INTRAVENOUS
  Filled 2023-07-10 (×3): qty 400

## 2023-07-10 NOTE — Progress Notes (Signed)
 Mobility Specialist - Progress Note   07/10/23 1037  Mobility  Activity Ambulated with assistance in hallway  Level of Assistance Standby assist, set-up cues, supervision of patient - no hands on  Assistive Device Front wheel walker  Distance Ambulated (ft) 250 ft  Activity Response Tolerated well  Mobility Referral Yes  Mobility visit 1 Mobility  Mobility Specialist Start Time (ACUTE ONLY) 1000  Mobility Specialist Stop Time (ACUTE ONLY) 1025  Mobility Specialist Time Calculation (min) (ACUTE ONLY) 25 min   Pt received in bed and agreeable to mobility. No complaints during session. Pt to bed after session with all needs met.    [facility]

## 2023-07-10 NOTE — Progress Notes (Signed)
 Patient triggering yellow MEWS due to heart rate. Patient endorses pain and complains of cough. PRN medications administered. Yellow MEWS implemented.  07/09/23 2347  Vitals  Temp 100.1 F (37.8 C)  Temp Source Oral  BP 121/60  MAP (mmHg) 76  BP Location Left Arm  BP Method Automatic  Patient Position (if appropriate) Lying  Pulse Rate (!) 124  Pulse Rate Source Monitor  Resp 16  MEWS COLOR  MEWS Score Color Yellow  Oxygen Therapy  SpO2 92 %  MEWS Score  MEWS Temp 0  MEWS Systolic 0  MEWS Pulse 2  MEWS RR 0  MEWS LOC 0  MEWS Score 2

## 2023-07-10 NOTE — Progress Notes (Signed)
 Pharmacy Antibiotic Note  Patrick Kidd is a 78 y.o. male admitted on 07/09/2023 with sepsis.  Pharmacy has been consulted for Cefepime  + Vancomycin  dosing.  Plan: Cefepime  2gm IV q8h Vancomycin  2gm IV q24h to target AUC 400-550.  -Estimated AUC using current pt parameters = 451 Monitor renal function and cx data    Height: 6\' 2"  (188 cm) Weight: 99.3 kg (218 lb 14.7 oz) IBW/kg (Calculated) : 82.2  Temp (24hrs), Avg:99.7 F (37.6 C), Min:97.9 F (36.6 C), Max:100.4 F (38 C)  Recent Labs  Lab 07/09/23 0954 07/09/23 1425 07/09/23 1733 07/09/23 1852 07/09/23 2104  WBC 15.3* 15.7*  --   --  14.2*  CREATININE 1.00  --  1.02  --   --   LATICACIDVEN  --   --   --  1.0  --     Estimated Creatinine Clearance: 75.1 mL/min (by C-G formula based on SCr of 1.02 mg/dL).    Allergies  Allergen Reactions   Rifaximin  Nausea And Vomiting    Required EMS visit, although patient did not go to hospital   Beta Adrenergic Blockers     Likely WPW.  Use with caution   Calcium  Channel Blockers     Likely WPW.  Use with caution.    Antimicrobials this admission: 6/5 Vancomycin  >>  6/5 Cefepime  >>   Dose adjustments this admission:  Microbiology results: 6/5 BCx:   Thank you for allowing pharmacy to be a part of this patient's care.  Arie Kurtz PharmD 07/10/2023 12:17 AM

## 2023-07-10 NOTE — Progress Notes (Signed)
 [facility] Progress Note  Patient: Patrick Kidd     QMV:[id number]  DOA: 07/09/2023   PCP: [name], MD       Brief hospital course: 78 y/o male with CLL, alcoholic and NASH cirrhosis with hepatic encephalopathy, thrombocytopenia, portal HTN,  H/o SBP in 2020, DM, peripheral neuropathy, prostate cancer, BPH, chronic back pain and osteoarthritis presented to the ED for an abdominal hematoma that was found on an outpatient CT scan which was ordered by GI on 6/5.   Further history> non productive cough x 1 mo, decreased stamina, R lower abdominal and right inguinal pain, 3-4 wks fevers up to 102 with chills and sweats.   3 days of urinary hesitancy. Poor appetite and 10 lb wt loss.  He fell about 2 wks ago and had persistent right-sided back and flank pain.   CT> 9.5 x 12.7 collection in right lower abdomen favoring probable evolving hematoma. Splenomegaly 9.7 x 12.7 cm, 2 mm R interpolar renal calculus  WBC on 3/25 were 6.7 and on 6/5 were 15.3   Subjective:  The patient is having pain-points to his right lower sacrum and right lower abdomen to show where his pain is.  He is able to ambulate but is on the steady when ambulating and needs a walker.  Oral intake with breakfast was excellent today.  Assessment and Plan: Principal Problem:   SIRS (systemic inflammatory response syndrome)  - Leukocytosis - Fevers on and off for 3 to 4 weeks and nearly daily for the past 1 week -Blood cultures pending - Respiratory panel including COVID and influenza negative - UA negative - He has a dry cough which is improving with Tessalon -chest x-ray was clear and lower lungs on CT abdomen and pelvis were also clear - Continue Vanco and cefepime  and if blood cultures negative after 24 hours, will discontinue  Active Problems:   Large right sided intra-abdominal hematoma-acute blood loss anemia - Cause uncertain but he did fall on his right side about 2 weeks ago - He has  persistent right sacral tenderness and deep right lower abdominal tenderness - Repeat CT scan tomorrow to ensure there is no continued bleeding - Follow hemoglobin-13.8 on 04/28/2023 and now 9.9- he did receive 1 L NS bolus so partly dilutional - Avoid anticoagulants    Type 2 diabetes mellitus with complication, with long-term current use of insulin  \ - Semglee  and NovoLog  based on sliding scale have been ordered      Chronic lymphocytic leukemia - Last evaluated in October 2024-did not need treatment at that time  Cirrhosis secondary to alcohol abuse-associated with thrombocytopenia  Acquired coagulopathy - INR 1.4, PT 17.5 - Given vitamin K 2.5 mg orally -will recheck tomorrow      Code Status: Full Code Total time on patient care: 35 min DVT prophylaxis:  Place and maintain sequential compression device Start: 07/09/23 2027     Objective:   Vitals:   07/10/23 0016 07/10/23 0212 07/10/23 0552 07/10/23 1002  BP:  (!) 142/70 (!) 145/67 130/63  Pulse: (!) 118 (!) 114 (!) 107 95  Resp:  16 16   Temp:  99.2 F (37.3 C) 97.9 F (36.6 C) 99.1 F (37.3 C)  TempSrc:  Oral Oral Oral  SpO2:  92% 96% 98%  Weight:      Height:       Filed Weights   07/09/23 1810  Weight: 99.3 kg   Exam: General exam: Appears comfortable  HEENT: oral mucosa moist Respiratory  system: Clear to auscultation.  Cardiovascular system: S1 & S2 heard  Gastrointestinal system: Abdomen soft, tenderness deep in the right lower quadrant, nondistended, bowel sounds positive Back: Tender in the right sacral area Extremities: No cyanosis, clubbing or edema Psychiatry:  Mood & affect appropriate.      CBC: Recent Labs  Lab 07/09/23 0954 07/09/23 1425 07/09/23 2104 07/10/23 0509  WBC 15.3* 15.7* 14.2* 13.8*  NEUTROABS 11.6*  --   --   --   HGB 11.3* 10.7* 10.5* 9.9*  HCT 33.2* 32.1* 31.2* 30.7*  MCV 88.0 90.7 89.9 94.8  PLT 226.0 191 186 175   Basic Metabolic Panel: Recent Labs  Lab  07/09/23 0954 07/09/23 1733 07/10/23 0509  NA 136 133* 134*  K 4.6 4.3 4.0  CL 96 98 102  CO2 27 25 24   GLUCOSE 191* 226* 226*  BUN 15 18 17   CREATININE 1.00 1.02 1.13  CALCIUM  9.1 8.6* 8.3*     Scheduled Meds:  feeding supplement  1 Container Oral TID BM   gabapentin   300 mg Oral TID   insulin  aspart  0-15 Units Subcutaneous TID WC   insulin  aspart  0-5 Units Subcutaneous QHS   insulin  glargine-yfgn  12 Units Subcutaneous Daily   lidocaine   1 patch Transdermal Q24H   multivitamin with minerals  1 tablet Oral Daily   pantoprazole   40 mg Oral BID    Imaging and lab data personally reviewed   Author: Tieshia Rettinger  07/10/2023 12:08 PM  To contact [facility]>   Check the care team in [facility] and look for the attending/consulting [facility] provider listed  Log into [email or web address] and use [place] universal password   Go to> "[facility]"  and find provider  If you still have difficulty reaching the provider, please page the [facility] (Director on Call) for the Hospitalists listed on amion

## 2023-07-10 NOTE — Plan of Care (Signed)
  Problem: Education: Goal: Knowledge of General Education information will improve Description: Including pain rating scale, medication(s)/side effects and non-pharmacologic comfort measures Outcome: Progressing   Problem: Health Behavior/Discharge Planning: Goal: Ability to manage health-related needs will improve Outcome: Progressing   Problem: Clinical Measurements: Goal: Ability to maintain clinical measurements within normal limits will improve Outcome: Progressing Goal: Will remain free from infection Outcome: Progressing Goal: Diagnostic test results will improve Outcome: Progressing Goal: Respiratory complications will improve Outcome: Progressing Goal: Cardiovascular complication will be avoided Outcome: Progressing   Problem: Activity: Goal: Risk for activity intolerance will decrease Outcome: Progressing   Problem: Nutrition: Goal: Adequate nutrition will be maintained Outcome: Progressing   Problem: Coping: Goal: Level of anxiety will decrease Outcome: Progressing   Problem: Pain Managment: Goal: General experience of comfort will improve and/or be controlled Outcome: Progressing   Problem: Safety: Goal: Ability to remain free from injury will improve Outcome: Progressing   Problem: Education: Goal: Ability to describe self-care measures that may prevent or decrease complications (Diabetes Survival Skills Education) will improve Outcome: Progressing   Problem: Health Behavior/Discharge Planning: Goal: Ability to manage health-related needs will improve Outcome: Progressing   Problem: Metabolic: Goal: Ability to maintain appropriate glucose levels will improve Outcome: Progressing

## 2023-07-11 ENCOUNTER — Inpatient Hospital Stay (HOSPITAL_COMMUNITY)

## 2023-07-11 DIAGNOSIS — K7460 Unspecified cirrhosis of liver: Secondary | ICD-10-CM | POA: Diagnosis not present

## 2023-07-11 DIAGNOSIS — Z9049 Acquired absence of other specified parts of digestive tract: Secondary | ICD-10-CM | POA: Diagnosis not present

## 2023-07-11 DIAGNOSIS — R6510 Systemic inflammatory response syndrome (SIRS) of non-infectious origin without acute organ dysfunction: Secondary | ICD-10-CM | POA: Diagnosis not present

## 2023-07-11 DIAGNOSIS — R161 Splenomegaly, not elsewhere classified: Secondary | ICD-10-CM | POA: Diagnosis not present

## 2023-07-11 LAB — BASIC METABOLIC PANEL WITH GFR
Anion gap: 7 (ref 5–15)
BUN: 19 mg/dL (ref 8–23)
CO2: 24 mmol/L (ref 22–32)
Calcium: 8.1 mg/dL — ABNORMAL LOW (ref 8.9–10.3)
Chloride: 101 mmol/L (ref 98–111)
Creatinine, Ser: 1.19 mg/dL (ref 0.61–1.24)
GFR, Estimated: >60 mL/min (ref >60)
Glucose, Bld: 229 mg/dL — ABNORMAL HIGH (ref 70–99)
Potassium: 4.2 mmol/L (ref 3.5–5.1)
Sodium: 132 mmol/L — ABNORMAL LOW (ref 135–145)

## 2023-07-11 LAB — CBC
HCT: 27.7 % — ABNORMAL LOW (ref 39.0–52.0)
Hemoglobin: 9.2 g/dL — ABNORMAL LOW (ref 13.0–17.0)
MCH: 30.5 pg (ref 26.0–34.0)
MCHC: 33.2 g/dL (ref 30.0–36.0)
MCV: 91.7 fL (ref 80.0–100.0)
Platelets: 164 10*3/uL (ref 150–400)
RBC: 3.02 MIL/uL — ABNORMAL LOW (ref 4.22–5.81)
RDW: 12.2 % (ref 11.5–15.5)
WBC: 12.5 10*3/uL — ABNORMAL HIGH (ref 4.0–10.5)
nRBC: 0 % (ref 0.0–0.2)

## 2023-07-11 LAB — GLUCOSE, CAPILLARY
Glucose-Capillary: 213 mg/dL — ABNORMAL HIGH (ref 70–99)
Glucose-Capillary: 204 mg/dL — ABNORMAL HIGH (ref 70–99)
Glucose-Capillary: 231 mg/dL — ABNORMAL HIGH (ref 70–99)
Glucose-Capillary: 244 mg/dL — ABNORMAL HIGH (ref 70–99)
Glucose-Capillary: 242 mg/dL — ABNORMAL HIGH (ref 70–99)

## 2023-07-11 LAB — PROTIME-INR
INR: 1.4 — ABNORMAL HIGH (ref 0.8–1.2)
Prothrombin Time: 17 s — ABNORMAL HIGH (ref 11.4–15.2)

## 2023-07-11 MED ORDER — INSULIN GLARGINE-YFGN 100 UNIT/ML ~~LOC~~ SOLN
16.0000 [IU] | Freq: Every day | SUBCUTANEOUS | Status: DC
  Administered 2023-07-12 – 2023-07-20 (×9): 16 [IU] via SUBCUTANEOUS
  Filled 2023-07-11 (×9): qty 0.16

## 2023-07-11 MED ORDER — OXYCODONE HCL 5 MG PO TABS
5.0000 mg | ORAL_TABLET | ORAL | Status: DC | PRN
  Administered 2023-07-11 – 2023-07-12 (×2): 5 mg via ORAL
  Filled 2023-07-11 (×2): qty 1

## 2023-07-11 MED ORDER — GADOBUTROL 1 MMOL/ML IV SOLN
9.9000 mL | Freq: Once | INTRAVENOUS | Status: AC | PRN
  Administered 2023-07-11: 9.9 mL via INTRAVENOUS

## 2023-07-11 MED ORDER — PHYTONADIONE 5 MG PO TABS
5.0000 mg | ORAL_TABLET | Freq: Once | ORAL | Status: AC
  Administered 2023-07-11: 5 mg via ORAL
  Filled 2023-07-11: qty 1

## 2023-07-11 MED ORDER — GUAIFENESIN-DM 100-10 MG/5ML PO SYRP
5.0000 mL | ORAL_SOLUTION | ORAL | Status: DC | PRN
  Administered 2023-07-11 – 2023-07-19 (×4): 5 mL via ORAL
  Filled 2023-07-11 (×4): qty 5

## 2023-07-11 NOTE — Plan of Care (Signed)
   Problem: Education: Goal: Knowledge of General Education information will improve Description: Including pain rating scale, medication(s)/side effects and non-pharmacologic comfort measures Outcome: Progressing   Problem: Clinical Measurements: Goal: Diagnostic test results will improve Outcome: Progressing   Problem: Activity: Goal: Risk for activity intolerance will decrease Outcome: Progressing   Problem: Pain Managment: Goal: General experience of comfort will improve and/or be controlled Outcome: Progressing

## 2023-07-11 NOTE — Progress Notes (Signed)
 [facility] Progress Note  Patient: Patrick Kidd     WUJ:[id number]  DOA: 07/09/2023   PCP: [name], MD       Brief hospital course: 78 y/o male with CLL, alcoholic and NASH cirrhosis with hepatic encephalopathy, thrombocytopenia, portal HTN,  H/o SBP in 2020, DM, peripheral neuropathy, prostate cancer, BPH, chronic back pain and osteoarthritis presented to the ED for an abdominal hematoma that was found on an outpatient CT scan which was ordered by GI on 6/5.   Further history> non productive cough x 1 mo, decreased stamina, R lower abdominal and right inguinal pain, 3-4 wks fevers up to 102 with chills and sweats.   3 days of urinary hesitancy. Poor appetite and 10 lb wt loss.  He fell about 2 wks ago and had persistent right-sided back and flank pain.   CT> 9.5 x 12.7 collection in right lower abdomen favoring probable evolving hematoma. Splenomegaly 9.7 x 12.7 cm, 2 mm R interpolar renal calculus  WBC on 3/25 were 6.7 and on 6/5 were 15.3   Subjective:  The patient received 10 mg of oxycodone  this morning and was quite confused afterwards.  I reevaluated him again just now and confusion has resolved.  His right sacrum and right lower abdominal pain is minimal at the time.  He states he is feeling better overall and has been eating quite well which is a change for him.  Assessment and Plan: Principal Problem:   SIRS (systemic inflammatory response syndrome)  - Leukocytosis-WBC count is steadily improving - Fevers on and off for 3 to 4 weeks and nearly daily for the past 1 week - Blood cultures negative x 48 hours - Respiratory panel including COVID and influenza negative - UA negative - He has a dry cough which is improving with Tessalon -chest x-ray was clear and lower lungs on CT abdomen and pelvis were also clear -having persistent fever of 101.3 today  Active Problems:   Large right sided intra-abdominal hematoma? -acute blood loss anemia - Cause  uncertain but he did fall on his right side about 2 weeks ago - He has persistent right sacral tenderness and deep right lower abdominal tenderness - Follow hemoglobin-13.8 on 04/28/2023 and now 9.2  - Avoid anticoagulants - CT repeated and MRI performed today-difficult to tell whether the fluid collection is a seroma, hematoma, abscess-will consult IR to see if they can drain it and send for culture if drainable - Will continue IV antibiotics until it is confirmed that this collection is not an abscess or an infected hematoma  Cirrhosis secondary to alcohol abuse-associated with thrombocytopenia    Type 2 diabetes mellitus with complication, with long-term current use of insulin   - Semglee  and NovoLog  based on sliding scale have been ordered - As oral intake is improving, sugars are rising-will increase Semglee  to 16 units today  Acute toxic encephalopathy - 10 mg of oxycodone  has caused confusion today - Reduce oxycodone  to 5 mg and keep at this dose      Chronic lymphocytic leukemia - Last evaluated in October 2024-did not need treatment at that time  Acquired coagulopathy - INR 1.4, PT 17.5 - Given vitamin K 2.5 mg orally but INR still elevated at 1.4 -Will give another dose of vitamin K 5 mg today and check INR again tomorrow      Code Status: Full Code Total time on patient care: 40 min DVT prophylaxis:  Place and maintain sequential compression device Start: 07/09/23 2027  Objective:   Vitals:   07/11/23 0557 07/11/23 0730 07/11/23 1113 07/11/23 1332  BP: (!) 133/106 (!) 131/56 124/64 133/60  Pulse: (!) 126 (!) 109 (!) 118 (!) 109  Resp:  18 20 16   Temp:  99.4 F (37.4 C) 100 F (37.8 C) (!) 101.3 F (38.5 C)  TempSrc:  Oral Oral Oral  SpO2:  (!) 89% 92% 94%  Weight:      Height:       Filed Weights   07/09/23 1810  Weight: 99.3 kg   Exam: General exam: Appears comfortable Neuro: Quite confused this morning around 8 to 9 AM but oriented times 3 by 5  p.m. HEENT: oral mucosa moist Respiratory system: Clear to auscultation.  Cardiovascular system: S1 & S2 heard  Gastrointestinal system: Abdomen soft, non-tender, nondistended. Normal bowel sounds   Extremities: No cyanosis, clubbing or edema Psychiatry:  Mood & affect appropriate.       CBC: Recent Labs  Lab 07/09/23 0954 07/09/23 1425 07/09/23 2104 07/10/23 0509 07/10/23 1608 07/11/23 0738  WBC 15.3* 15.7* 14.2* 13.8*  --  12.5*  NEUTROABS 11.6*  --   --   --   --   --   HGB 11.3* 10.7* 10.5* 9.9* 9.4* 9.2*  HCT 33.2* 32.1* 31.2* 30.7* 28.6* 27.7*  MCV 88.0 90.7 89.9 94.8  --  91.7  PLT 226.0 191 186 175  --  164   Basic Metabolic Panel: Recent Labs  Lab 07/09/23 0954 07/09/23 1733 07/10/23 0509 07/11/23 0738  NA 136 133* 134* 132*  K 4.6 4.3 4.0 4.2  CL 96 98 102 101  CO2 27 25 24 24   GLUCOSE 191* 226* 226* 229*  BUN 15 18 17 19   CREATININE 1.00 1.02 1.13 1.19  CALCIUM  9.1 8.6* 8.3* 8.1*     Scheduled Meds:  benzonatate   100 mg Oral TID   feeding supplement  1 Container Oral TID BM   gabapentin   300 mg Oral TID   insulin  aspart  0-15 Units Subcutaneous TID WC   insulin  glargine-yfgn  12 Units Subcutaneous Daily   lidocaine   1 patch Transdermal Q24H   multivitamin with minerals  1 tablet Oral Daily   pantoprazole   40 mg Oral BID    Imaging and lab data personally reviewed   Author: Nadia Viar  07/11/2023 5:25 PM  To contact [facility]>   Check the care team in [facility] and look for the attending/consulting [facility] provider listed  Log into [email or web address] and use [place] universal password   Go to> "[facility]"  and find provider  If you still have difficulty reaching the provider, please page the [facility] (Director on Call) for the Hospitalists listed on amion

## 2023-07-11 NOTE — Plan of Care (Signed)

## 2023-07-11 NOTE — Evaluation (Addendum)
 Physical Therapy Evaluation Patient Details Name: Patrick Kidd MRN: [id number] DOB: 21-Jun-1945 Today's Date: 07/11/2023  History of Present Illness  Pt is a 78yo male presenting to the ED for management of abdominal hematoma, night sweats; fell on his back ~2 weeks ago with CT lumbar spine with moderate central canal stenosis multi-level without acute fx or traumatic sublux. PMH: acrophobia, alcoholic cirrhosis, anemia, BPH, CLL, DM, GERD, gout, peripheral neuropathy, hx of prostate cancer, "back surgery,"  Wolff-Parkinson-White syndrome  Clinical Impression  Pt presents with the above diagnosis. Upon entry, pt appears to be confused, stating "is it noon or midnight? I feel like it's got to be one or the other," informed pt it was ~1030am, and he responded "really?" Pt struggling to form complete sentences in response to PLOF and home environment questions, was not able to describe his recent fall to this therapist; pt struggling with 1-step simple commands with multimodal cuing, following 1-step simple commands inconsistently. Pt demonstrated decreased fine and gross motor coordination of BUE and gross motor coordination of BLE; pt exhibiting resting tremors of BUE in bilateral forearms. Pt able to perform bed mobility CGA with use of rails, rolling and performing sidelying to sit. Pt pointing to R SIJ when asked where the pain is located, attempted to provide palpation assessment of lumbar spine and pt repeatedly pointing to R SIJ and stating "the pain is here," asked pt to describe the nature of his previous back surgery as surgical scar noted over approximate area of L3-L5, pt unable to state what type of surgery he had; chart review lists "back surgery" as only notation. Pt able to perform STS with RW from elevated bed CGA, and ambulate CGA short-distance. Initially on 2LO2 via [place], discontinued and pt remained 97-100% on RA even during ambulation, discontinued O2 and informed RN and MD. Pt's HR 127  sitting EOB elevated to 140 during ambulation, informed RN. Pt in recliner with call button on lap and chair alarm engaged. Informed RN of this therapist's findings as pt ambulated ~219ft with mobility specialist yesterday, feel this is not the patient's cognitive nor functional status but no family here to confirm. Pt may continue with mobility specialist, ordering OT consult, unable to provide discharge recommendation for DME or PT services at this time, will advise. We will continue to follow acutely.        If plan is discharge home, recommend the following: A little help with walking and/or transfers;A little help with bathing/dressing/bathroom;Help with stairs or ramp for entrance;Supervision due to cognitive status;Assist for transportation   Can travel by private vehicle        Equipment Recommendations Other (comment) (TBD)  Recommendations for Other Services  OT consult    Functional Status Assessment Patient has had a recent decline in their functional status and demonstrates the ability to make significant improvements in function in a reasonable and predictable amount of time.     Precautions / Restrictions Precautions Precautions: Fall Precaution/Restrictions Comments: Lue Sage ago, injured R lower quadrant of back, points to R SIJ Restrictions Weight Bearing Restrictions Per Provider Order: No      Mobility  Bed Mobility Overal bed mobility: Needs Assistance Bed Mobility: Rolling, Sidelying to Sit Rolling: Supervision, Used rails Sidelying to sit: Supervision, Used rails, HOB elevated       General bed mobility comments: Pt utilized rails to pull up, HOB elevated, increased time.    Transfers Overall transfer level: Needs assistance Equipment used: Rolling walker (2 wheels) Transfers: Sit  to/from Stand Sit to Stand: Contact guard assist, From elevated surface           General transfer comment: Pt guided through STS to RW from elevated bed, CGA, no  physical assist required, multimodal cues for BUE on bed to power up, pt in high degree of trunk flexion hip flexion stating "it hurts in my back to stand up," after cued to walk pt able to perform more upright stance especially at hips.    Ambulation/Gait Ambulation/Gait assistance: Contact guard assist Gait Distance (Feet): 15 Feet Assistive device: Rolling walker (2 wheels) Gait Pattern/deviations: Step-to pattern, Trunk flexed Gait velocity: decreased     General Gait Details: Pt ambulated with RW adn CGA with recliner follow for safety, no overt LOB noted nor physical assist required, reduced step / stride length and very slow cadence, encouragement to continue ambulation.  Stairs            Wheelchair Mobility     Tilt Bed    Modified Rankin (Stroke Patients Only)       Balance Overall balance assessment: Needs assistance, History of Falls Sitting-balance support: Feet supported, Bilateral upper extremity supported Sitting balance-Leahy Scale: Poor Sitting balance - Comments: Pt able to sit statically EOB but with MMT pt falling backwards and L laterally requiring UE support or PT modA to correct Postural control: Posterior lean, Left lateral lean Standing balance support: During functional activity, Reliant on assistive device for balance, Bilateral upper extremity supported Standing balance-Leahy Scale: Poor Standing balance comment: Pt initially standing with backs of legs on bed, then with cuing able to stand upright                             Pertinent Vitals/Pain Pain Assessment Pain Assessment: 0-10 Pain Score: 6  Pain Location: R SIJ with mobility, 0/10 pain in bed. Pain Descriptors / Indicators: Other (Comment) (did not describe) Pain Intervention(s): Monitored during session, Repositioned    Home Living Family/patient expects to be discharged to:: Private residence Living Arrangements: Spouse/significant other Available Help at  Discharge: Family;Available 24 hours/day Type of Home: House Home Access: Stairs to enter Entrance Stairs-Rails: None Entrance Stairs-Number of Steps: 1   Home Layout: One level Home Equipment: Cane - single Librarian, academic (2 wheels);Wheelchair - manual Additional Comments: Fell ~2 weeks ago. 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 [employer], reporting uses a SPC sometimes ADLs Comments: IND     Extremity/Trunk Assessment   Upper Extremity Assessment Upper Extremity Assessment: RUE deficits/detail;LUE deficits/detail RUE Deficits / Details: Pt exhibiting resting tremor that he reports as new, generally focused to bilateral forearms "pulling" type movement, grip strength adequate, MMT grossly 3+/5 RUE Sensation: WNL RUE Coordination: decreased gross motor;decreased fine motor (Pt struggling with finger to nose and thumb to finger coordination) LUE Deficits / Details: Pt exhibiting resting tremor that he reports as new, generally focused to bilateral forearms "pulling" type movement, grip strength adequate, MMT grossly 3+/5 LUE Coordination: decreased gross motor;decreased fine motor (Pt struggling with finger to nose and thumb to finger coordination)    Lower Extremity Assessment Lower Extremity Assessment: RLE deficits/detail;LLE deficits/detail RLE Deficits / Details: Pt generally not responding well to simple 1-step commands with multimodal cuing for strength testing; approximately 3+/5 RLE Sensation:  history of peripheral neuropathy RLE Coordination: decreased gross motor LLE Deficits / Details: Pt generally not responding well to simple 1-step commands with multimodal cuing for strength testing; approximately 3+/5 LLE Sensation: history of peripheral neuropathy LLE Coordination: decreased gross  motor       Communication   Communication Communication: Impaired Factors Affecting Communication: Difficulty expressing self    Cognition Arousal: Alert Behavior During Therapy: Anxious   PT - Cognitive impairments: No family/caregiver present to determine baseline                       PT - Cognition Comments: Pt immediately stating "is it 12noon or midnight? It's got to be one of those" when PT described it was "1030am." Pt attempting to answer hx/PLOF questions but trailing off requiring multiple attempts/requery to acheive response; responses generally appropriate but with increased time, long pauses, pt staring off into space; pt at one point stated "I think I'm losing my mind." Pt struggling with following simple 1-step commands especially during MMT. Following commands: Impaired Following commands impaired: Follows one step commands inconsistently, Follows one step commands with increased time     Cueing Cueing Techniques: Verbal cues, Gestural cues, Tactile cues, Visual cues     General Comments      Exercises     Assessment/Plan    PT Assessment Patient needs continued PT services  PT Problem List Decreased strength;Decreased activity tolerance;Decreased balance;Decreased mobility;Decreased coordination;Decreased cognition;Decreased knowledge of use of DME;Decreased safety awareness       PT Treatment Interventions DME instruction;Gait training;Stair training;Functional mobility training;Therapeutic activities;Therapeutic exercise;Balance training;Neuromuscular re-education;Cognitive remediation;Patient/family education    PT Goals (Current goals can be found in the Care Plan section)  Acute Rehab PT Goals Patient Stated Goal: none stated PT Goal Formulation: Patient unable to participate in goal setting Potential to Achieve Goals: Good    Frequency Min 1X/week     Co-evaluation               AM-PAC PT "6 Clicks" Mobility  Outcome Measure  Help needed turning from your back to your side while in a flat bed without using bedrails?: A Little Help needed moving from lying on your back to sitting on the side of a flat bed without using bedrails?: A Little Help needed moving to and from a bed to a chair (including a wheelchair)?: A Little Help needed standing up from a chair using your arms (e.g., wheelchair or bedside chair)?: A Little Help needed to walk in hospital room?: A Little Help needed climbing 3-5 steps with a railing? : A Lot 6 Click Score: 17    End of Session Equipment Utilized During Treatment: Gait belt;Oxygen (2LO2 via [place] initially, then d/c after O2 walking test at 100%) Activity Tolerance: Patient tolerated treatment well;No increased pain Patient left: in chair;with call bell/phone within reach;with chair alarm set Nurse Communication: Mobility status;Other (comment) (Findings of neuro screen, pt's cognitive status, discontinue of O2) PT Visit Diagnosis: Unsteadiness on feet (R26.81);History of falling (Z91.81);Difficulty in walking, not elsewhere classified (R26.2)    Time: 1020-1059 PT Time Calculation (min) (ACUTE ONLY): 39 min   Charges:   PT Evaluation $PT Eval Moderate Complexity: 1 Mod PT Treatments $Gait Training: 8-22 mins $Therapeutic Activity: 8-22 mins PT General Charges $$ ACUTE PT VISIT: 1 Visit         Jerrye Mori, PT, DPT [facility] Rehabilitation Department Office: [phone]  Jerrye Mori 07/11/2023, 11:12 AM

## 2023-07-12 ENCOUNTER — Encounter (HOSPITAL_COMMUNITY): Payer: Self-pay | Admitting: Student

## 2023-07-12 DIAGNOSIS — R6510 Systemic inflammatory response syndrome (SIRS) of non-infectious origin without acute organ dysfunction: Secondary | ICD-10-CM | POA: Diagnosis not present

## 2023-07-12 LAB — BASIC METABOLIC PANEL WITH GFR
Anion gap: 10 (ref 5–15)
BUN: 19 mg/dL (ref 8–23)
CO2: 21 mmol/L — ABNORMAL LOW (ref 22–32)
Calcium: 8.2 mg/dL — ABNORMAL LOW (ref 8.9–10.3)
Chloride: 101 mmol/L (ref 98–111)
Creatinine, Ser: 0.97 mg/dL (ref 0.61–1.24)
GFR, Estimated: >60 mL/min (ref >60)
Glucose, Bld: 204 mg/dL — ABNORMAL HIGH (ref 70–99)
Potassium: 3.9 mmol/L (ref 3.5–5.1)
Sodium: 132 mmol/L — ABNORMAL LOW (ref 135–145)

## 2023-07-12 LAB — CBC
HCT: 27.4 % — ABNORMAL LOW (ref 39.0–52.0)
Hemoglobin: 9.1 g/dL — ABNORMAL LOW (ref 13.0–17.0)
MCH: 30.5 pg (ref 26.0–34.0)
MCHC: 33.2 g/dL (ref 30.0–36.0)
MCV: 91.9 fL (ref 80.0–100.0)
Platelets: 192 10*3/uL (ref 150–400)
RBC: 2.98 MIL/uL — ABNORMAL LOW (ref 4.22–5.81)
RDW: 12.2 % (ref 11.5–15.5)
WBC: 12.2 10*3/uL — ABNORMAL HIGH (ref 4.0–10.5)
nRBC: 0 % (ref 0.0–0.2)

## 2023-07-12 LAB — GLUCOSE, CAPILLARY
Glucose-Capillary: 195 mg/dL — ABNORMAL HIGH (ref 70–99)
Glucose-Capillary: 221 mg/dL — ABNORMAL HIGH (ref 70–99)
Glucose-Capillary: 200 mg/dL — ABNORMAL HIGH (ref 70–99)
Glucose-Capillary: 220 mg/dL — ABNORMAL HIGH (ref 70–99)

## 2023-07-12 LAB — PROTIME-INR
INR: 1.4 — ABNORMAL HIGH (ref 0.8–1.2)
Prothrombin Time: 16.9 s — ABNORMAL HIGH (ref 11.4–15.2)

## 2023-07-12 MED ORDER — TRAMADOL HCL 50 MG PO TABS
100.0000 mg | ORAL_TABLET | Freq: Four times a day (QID) | ORAL | Status: DC | PRN
  Administered 2023-07-12: 100 mg via ORAL
  Filled 2023-07-12: qty 2

## 2023-07-12 MED ORDER — ACETAMINOPHEN 325 MG PO TABS
650.0000 mg | ORAL_TABLET | Freq: Two times a day (BID) | ORAL | Status: DC
  Administered 2023-07-12 – 2023-07-20 (×15): 650 mg via ORAL
  Filled 2023-07-12 (×16): qty 2

## 2023-07-12 NOTE — Evaluation (Signed)
 Occupational Therapy Evaluation Patient Details Name: Patrick Kidd MRN: [id number] DOB: 03-12-1945 Today's Date: 07/12/2023   History of Present Illness   Pt is a 78 year old male presenting to the ED for management of abdominal hematoma, night sweats; fell on his back ~2 weeks ago with CT lumbar spine with moderate central canal stenosis multi-level without acute fx or traumatic sublux. PMH: acrophobia, alcoholic cirrhosis, anemia, BPH, CLL, DM, GERD, gout, peripheral neuropathy, hx of prostate cancer, "back surgery,"  Wolff-Parkinson-White syndrome     Clinical Impressions Patient is a 78 year old male who was admitted for above. Patient was living at home with wife while still working. Patient needed CGA up with RW to engage in grooming at sink with confusion still prominent. Nurse and MD made aware. Patient was noted to have decreased functional activity tolerance, decreased endurance, decreased standing balance, decreased safety awareness, and decreased knowledge of AD/AE impacting participation in ADLs.  Plan is to d/c home with family support and [facility] services when medically stable.      If plan is discharge home, recommend the following:   A lot of help with bathing/dressing/bathroom;Assistance with cooking/housework;Assist for transportation;Help with stairs or ramp for entrance;Direct supervision/assist for financial management;Direct supervision/assist for medications management;A little help with walking and/or transfers     Functional Status Assessment   Patient has had a recent decline in their functional status and demonstrates the ability to make significant improvements in function in a reasonable and predictable amount of time.     Equipment Recommendations   None recommended by OT      Precautions/Restrictions   Precautions Precautions: Fall Precaution/Restrictions Comments: Lue Sage ago, injured R lower quadrant of back, points to R  SIJ Restrictions Weight Bearing Restrictions Per Provider Order: No     Mobility Bed Mobility Overal bed mobility: Needs Assistance   Rolling: Supervision, Used rails Sidelying to sit: Supervision, Used rails, HOB elevated       General bed mobility comments: cues for rolling to not twist           Balance Overall balance assessment: Needs assistance, History of Falls Sitting-balance support: Feet supported, Bilateral upper extremity supported Sitting balance-Leahy Scale: Fair     Standing balance support: During functional activity, Reliant on assistive device for balance, Bilateral upper extremity supported Standing balance-Leahy Scale: Fair           ADL either performed or assessed with clinical judgement   ADL Overall ADL's : Needs assistance/impaired Eating/Feeding: Sitting;Set up   Grooming: Oral care;Standing;Contact guard assist Grooming Details (indicate cue type and reason): at sink cues for proper placement of walker. Upper Body Bathing: Sitting;Contact guard assist   Lower Body Bathing: Sitting/lateral leans;Maximal assistance   Upper Body Dressing : Sitting;Set up;Supervision/safety   Lower Body Dressing: Sitting/lateral leans;Maximal assistance   Toilet Transfer: Minimal assistance;Ambulation;Rolling walker (2 wheels) Toilet Transfer Details (indicate cue type and reason): with increased time.   Toileting - Clothing Manipulation Details (indicate cue type and reason): patietn with primofit in place per nursing this AM. patient has no recollection of this being in place even with cues. patients wife in room attemptign to help redirect patient as well. wife reporting patient has been wearing adult absorbent undergarments at baseline             Vision   Vision Assessment?: No apparent visual deficits            Pertinent Vitals/Pain Pain Assessment Pain Assessment: Faces Faces Pain Scale:  Hurts a little bit Pain Location: R SIJ with  mobility, 0/10 pain in bed. Pain Descriptors / Indicators: Constant Pain Intervention(s): Limited activity within patient's tolerance, Monitored during session, Premedicated before session     Extremity/Trunk Assessment Upper Extremity Assessment Upper Extremity Assessment: Overall WFL for tasks assessed   Lower Extremity Assessment Lower Extremity Assessment: Defer to PT evaluation       Communication Communication Communication: Impaired Factors Affecting Communication: Difficulty expressing self   Cognition Arousal: Alert Behavior During Therapy: Flat affect Cognition: Cognition impaired             OT - Cognition Comments: patient had no awareness of primofit that was in place. wife said patient was more alert and engaging today compared to yesterday. patient having hard time processing during session but plesant and attempting to make jokes during session.                 Following commands: Impaired Following commands impaired: Follows one step commands with increased time, Follows one step commands inconsistently     Cueing  General Comments   Cueing Techniques: Verbal cues;Gestural cues;Tactile cues;Visual cues              Home Living Family/patient expects to be discharged to:: Private residence Living Arrangements: Spouse/significant other Available Help at Discharge: Family;Available 24 hours/day Type of Home: House Home Access: Stairs to enter [employer] of Steps: 1 Entrance Stairs-Rails: None Home Layout: One level     Bathroom Shower/Tub: Producer, television/film/video: Standard     Home Equipment: Cane - single Librarian, academic (2 wheels);Wheelchair - manual          Prior Functioning/Environment Prior Level of Function : Independent/Modified Independent;Working/employed;Driving;History of Falls (last six months)             Mobility Comments: patient is still working.      OT Problem List: Impaired  balance (sitting and/or standing);Decreased safety awareness;Decreased knowledge of precautions;Decreased activity tolerance;Decreased knowledge of use of DME or AE   OT Treatment/Interventions: Self-care/ADL training;DME and/or AE instruction;Therapeutic activities;Balance training;Energy conservation;Patient/family education      OT Goals(Current goals can be found in the care plan section)   Acute Rehab OT Goals Patient Stated Goal: to get proceedure completed tomorrow OT Goal Formulation: With patient/family Time For Goal Achievement: 07/26/23 Potential to Achieve Goals: Fair   OT Frequency:  Min 2X/week       AM-PAC OT "6 Clicks" Daily Activity     Outcome Measure Help from another person eating meals?: A Little Help from another person taking care of personal grooming?: A Little Help from another person toileting, which includes using toliet, bedpan, or urinal?: A Lot Help from another person bathing (including washing, rinsing, drying)?: A Lot Help from another person to put on and taking off regular upper body clothing?: A Little Help from another person to put on and taking off regular lower body clothing?: A Lot 6 Click Score: 15   End of Session Equipment Utilized During Treatment: Rolling walker (2 wheels) Nurse Communication: Other (comment) (patients poor awareness of external catheter.)  Activity Tolerance: Patient tolerated treatment well Patient left: in chair;with call bell/phone within reach;with chair alarm set;with family/visitor present  OT Visit Diagnosis: Unsteadiness on feet (R26.81);Other abnormalities of gait and mobility (R26.89)                Time: 1610-9604 OT Time Calculation (min): 27 min Charges:  OT General Charges $OT Visit: 1 Visit  OT Evaluation $OT Eval Low Complexity: 1 Low OT Treatments $Self Care/Home Management : 8-22 mins  Wynette Heckler, MS Acute Rehabilitation Department Office# [phone]   Jame Maze 07/12/2023, 1:02  PM

## 2023-07-12 NOTE — Consult Note (Signed)
 Chief Complaint: Right lower quadrant hematoma/abscess - image guided percutaneous hematoma/abscess drain placement   Referring Provider(s): Sedalia Dacosta   Supervising Physician: Creasie Doctor  Patient Status: [facility] - In-pt  History of Present Illness: Patrick Kidd is a 78 y.o. male with history of alcoholic cirrhosis, BPH, prostate cancer, sleep apnea, duodenal ulcer, acute cholecystitis status post cholecystostomy 12/09/2022, and evolving right lower quadrant hematoma.  Patient presented to the emergency department 07/09/2023 after a routine gastroenterology appointment where a CT abdomen pelvis was obtained revealing 9.5 x 12.7 cm soft tissue attenuation collection in the right lower abdomen, favoring probable evolving hematoma.  Patient was admitted for further workup and treatment.  Patient is known to us  from cholecystostomy placement 12/09/2022 with Dr. Mabel Savage and multiple biliary drain exchanges, most recently being 01/23/2023.  Interventional radiology was consulted for possible image guided percutaneous abscess/hematoma drain placement.  Imaging was reviewed and approved by Dr. Jinx Mourning 07/12/2023 for image guided percutaneous abscess/hematoma drain placement.   Patient is Full Code  Past Medical History:  Diagnosis Date   Acrophobia    Alcoholic cirrhosis (HCC) 10/04/2015   Anemia    Arthritis    foot by big toe   BPH associated with nocturia    Cataract    removed both eyes   Cholecystitis 05/2022   tx Abx   Chronic cough    PMH of   CLL (chronic lymphocytic leukemia) (HCC)    COVID-19 12/2018   Diabetes mellitus without complication (HCC)    Diverticulosis 07/03/2010   Colonoscopy.    Duodenal ulcer 2017   Fallen arches    Bilateral   GERD (gastroesophageal reflux disease)    Gout    Granuloma annulare    Hx of adenomatous colonic polyps multiple   Hydrocele 2011   Large septated right hydrocele   Liver cyst    Liver lesion    Nonspecific elevation of  levels of transaminase or lactic acid dehydrogenase (LDH)    Obesity    Peripheral neuropathy    Plantar fasciitis    PMH of   Portal hypertension (HCC) 2017   Prostate cancer (HCC)    Right shoulder pain 11/2017   Sleep apnea    no cpap, patient denies   Thiamine  deficiency    Wears reading eyeglasses    WPW (Wolff-Parkinson-White syndrome) 02/05/2019    Past Surgical History:  Procedure Laterality Date   BACK SURGERY  04/28/2022   CATARACT EXTRACTION, BILATERAL  12/2011   Dr Gennie Kicks   COLONOSCOPY  2017   COLONOSCOPY W/ POLYPECTOMY  07/03/2010   2 adenomas, diverticulosis on right. Dr Willy Harvest   CYSTOSCOPY N/A 03/15/2018   Procedure: Ardith Bedford;  Surgeon: Samson Croak, MD;  Location: [facility];  Service: Urology;  Laterality: N/A;  NO SEEDS FOUND IN BLADDER   ESOPHAGOGASTRODUODENOSCOPY (EGD) WITH PROPOFOL  N/A 01/08/2016   Procedure: ESOPHAGOGASTRODUODENOSCOPY (EGD) WITH PROPOFOL ;  Surgeon: Asencion Blacksmith, MD;  Location: [facility];  Service: Endoscopy;  Laterality: N/A;   FLEXIBLE SIGMOIDOSCOPY  2000   IR EXCHANGE BILIARY DRAIN  02/17/2023   IR PATIENT EVAL TECH 0-60 MINS  12/26/2022   IR PERC CHOLECYSTOSTOMY  12/05/2022   IR PERC CHOLECYSTOSTOMY  12/09/2022   IR RADIOLOGIST EVAL & MGMT  01/16/2023   RADIOACTIVE SEED IMPLANT N/A 03/15/2018   Procedure: RADIOACTIVE SEED IMPLANT/BRACHYTHERAPY IMPLANT;  Surgeon: Samson Croak, MD;  Location: [facility];  Service: Urology;  Laterality: N/A;  73 SEEDS IMPLANTED   RADIOLOGY WITH ANESTHESIA N/A 12/09/2022   Procedure: IR WITH ANESTHESIA;  Surgeon: Radiologist, Medication, MD;  Location: [facility];  Service: Radiology;  Laterality: N/A;   SIGMOIDOSCOPY     SPACE OAR INSTILLATION N/A 03/15/2018   Procedure: SPACE OAR INSTILLATION;  Surgeon: Samson Croak, MD;  Location: [facility];  Service: Urology;  Laterality: N/A;   WISDOM TOOTH EXTRACTION       Allergies: Rifaximin , Beta adrenergic blockers, and Calcium  channel blockers  Medications: Prior to Admission medications   Medication Sig Start Date End Date Taking? Authorizing Provider  gabapentin  (NEURONTIN ) 300 MG capsule Take 1 capsule (300 mg total) by mouth 3 (three) times daily. Patient taking differently: Take 300-600 mg by mouth See admin instructions. Take 300mg  in the AM and 600mg  in the PM. 05/01/23  Yes [name], MD  insulin  glargine, 2 Unit Dial , (TOUJEO  MAX SOLOSTAR) 300 UNIT/ML Solostar Pen Inject 12 Units into the skin daily. Via SANOFI pt assistance 05/01/23  Yes [name], MD  metFORMIN  (GLUCOPHAGE ) 500 MG tablet TAKE 1 TABLET BY MOUTH TWICE A DAY WITH FOOD 04/23/23  Yes [name], MD  Multiple Vitamins-Minerals (CENTRUM SILVER 50+MEN) TABS Take 1 tablet by mouth daily.   Yes [provider]  pantoprazole  (PROTONIX ) 40 MG tablet TAKE 1 TABLET BY MOUTH 2 TIMES DAILY 30 MINUTES BEFORE MEALS (BREAKFAST AND SUPPER) 06/30/23  Yes [name], MD  cyclobenzaprine  (FLEXERIL ) 10 MG tablet Take 10 mg by mouth 3 (three) times daily. Patient not taking: Reported on 07/10/2023 07/09/23   [provider]  HYDROcodone -acetaminophen  (NORCO/VICODIN) 5-325 MG tablet Take 1 tablet by mouth every 6 (six) hours as needed. Patient not taking: Reported on 07/10/2023 07/09/23   [provider]  Insulin  Pen Needle (BD PEN NEEDLE MINI ULTRAFINE) 31G X 5 MM MISC INJECT 1 ACT INTO THE SKIN DAILY. USE TO ADMINISTER INSULIN . DX E11.9   Strength: 31G X 5 MM 06/30/23   [name], MD     Family History  Problem Relation Age of Onset   Lung cancer Mother        smoker   Leukemia Father        Acute myelocytic   Diabetes Neg Hx    Stroke Neg Hx    Heart disease Neg Hx    Colon cancer Neg Hx    Colon polyps Neg Hx    Esophageal cancer Neg Hx    Rectal cancer Neg Hx    Stomach cancer Neg Hx     Social History   Socioeconomic History   Marital  status: Married    Spouse name: Sheryle Donning   Number of children: 2   Years of education: Not on file   Highest education level: Some college, no degree  Occupational History   Occupation: Multimedia programmer estate  Tobacco Use   Smoking status: Former    Current packs/day: 0.00    Average packs/day: 2.0 packs/day for 6.0 years (12.0 ttl pk-yrs)    Types: Cigarettes    Start date: 02/03/1965    Quit date: 02/04/1971    Years since quitting: 52.4    Passive exposure: Never   Smokeless tobacco: Never   Tobacco comments:    smoked age [age], up to 2 ppd  Vaping Use   Vaping status: Never Used  Substance and Sexual Activity   Alcohol use: Not Currently    Comment: No alochol since Christmas Day 2020  Drug use: No   Sexual activity: Yes    Partners: Female  Other Topics Concern   Not on file  Social History Narrative   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 He lives with his wife in a one level home. He swims most days.   Social Drivers of Corporate investment banker Strain: Low Risk  (03/21/2023)   Received from [facility]   Overall Financial Resource Strain (CARDIA)    Difficulty of Paying Living Expenses: Not very hard  Food Insecurity: No Food Insecurity (03/21/2023)   Received from [facility]   Hunger Vital Sign    Worried About Running Out of Food in the Last Year: Never true    Ran Out of Food in the Last Year: Never true  Transportation Needs: Unknown (03/21/2023)   Received from [facility] - Transportation    In the past 12 months, has lack of transportation kept you from medical appointments or from getting medications?: No    Lack of Transportation (Non-Medical): Not on file  Physical Activity: Inactive (12/02/2021)   Exercise Vital Sign     Days of Exercise per Week: 0 days    Minutes of Exercise per Session: 0 min  Stress: No Stress Concern Present (12/02/2021)   [employer] of Occupational Health - Occupational Stress Questionnaire    Feeling of Stress : Not at all  Social Connections: Moderately Integrated (12/02/2021)   Social Connection and Isolation Panel [NHANES]    Frequency of Communication with Friends and Family: More than three times a week    Frequency of Social Gatherings with Friends and Family: More than three times a week    Attends Religious Services: Never    Database administrator or Organizations: Yes    Attends Engineer, structural: More than 4 times per year    Marital Status: Married     Review of Systems: A 12 point ROS discussed and pertinent positives are indicated in the HPI above.  All other systems are negative.  Review of Systems  Constitutional:  Negative for fatigue and fever.  HENT:  Negative for congestion and dental problem.   Respiratory:  Negative for cough, chest tightness, shortness of breath and wheezing.   Cardiovascular:  Negative for chest pain.  Gastrointestinal:  Negative for diarrhea, nausea and vomiting.  Neurological:  Negative for light-headedness and headaches.  Psychiatric/Behavioral:  Negative for agitation, behavioral problems and confusion.     Vital Signs: BP (!) 160/60 (BP Location: Left Arm)   Pulse (!) 112   Temp 100 F (37.8 C) (Oral)   Resp 16   Ht 6\' 2"  (1.88 m)   Wt 218 lb 14.7 oz (99.3 kg)   SpO2 94%   BMI 28.11 kg/m   Advance Care Plan: The advanced care place/surrogate decision maker was discussed at the time of visit and the patient did not wish to discuss or was not able to name a surrogate decision maker or provide an advance care plan.  Physical Exam Constitutional:      Appearance: Normal appearance.  HENT:     Head: Normocephalic and atraumatic.     Mouth/Throat:     Mouth: Mucous membranes are moist.  Cardiovascular:  Rate and Rhythm: Normal rate and regular rhythm.  Pulmonary:     Effort: Pulmonary effort is normal.     Breath sounds: Normal breath sounds.  Abdominal:     General: Bowel sounds are normal.     Palpations: Abdomen is soft.  Musculoskeletal:        General: Normal range of motion.     Cervical back: Normal range of motion.  Skin:    General: Skin is warm and dry.  Neurological:     Mental Status: He is alert and oriented to person, place, and time.  Psychiatric:        Mood and Affect: Mood normal.        Behavior: Behavior normal.     Imaging: MR ABDOMEN W WO CONTRAST Result Date: 07/11/2023 CLINICAL DATA:  Evaluate retroperitoneal mass/fluid collection EXAM: MRI ABDOMEN AND PELVIS WITHOUT AND WITH CONTRAST TECHNIQUE: Multiplanar multisequence MR imaging of the abdomen and pelvis was performed both before and after the administration of intravenous contrast. CONTRAST:  9.9mL GADAVIST  GADOBUTROL  1 MMOL/ML IV SOLN COMPARISON:  CT abdomen pelvis, 07/11/2023, 10:07 a.m. FINDINGS: COMBINED FINDINGS FOR BOTH MR ABDOMEN AND PELVIS Examination is generally limited by breath motion artifact throughout, which particularly degrades multiphasic contrast enhanced sequences, as well as coverage and field inhomogeneity issues as detailed below. Lower chest: No acute abnormality. Hepatobiliary: No solid liver abnormality is seen. Coarse, nodular cirrhotic morphology of the liver. Status post cholecystectomy. No biliary ductal dilatation. Benign cyst in the peripheral right lobe of the liver, requiring no further follow-up or characterization. Pancreas: Unremarkable. No pancreatic ductal dilatation or surrounding inflammatory changes. Spleen: Splenomegaly, maximum coronal span 17.8 cm. Adrenals/Urinary Tract: Adrenal glands are unremarkable. Kidneys are normal, without renal calculi, solid lesion, or hydronephrosis. Bladder is unremarkable. Stomach/Bowel: Stomach is within normal limits. Appendix appears  normal. No evidence of bowel wall thickening, distention, or inflammatory changes. Vascular/Lymphatic: Small varices throughout the left upper quadrant (series 33, image 46). No enlarged abdominal or pelvic lymph nodes. Reproductive: No mass or other significant abnormality. Other: No abdominal wall hernia or abnormality. Trace perihepatic ascites. Large, rim enhancing fluid collection situated in the low right retroperitoneum overlying the inner table of the abdominal wall musculature as well as the right iliacus musculature. Unfortunately, this is incompletely imaged on several sequences due to scanner bore and field inhomogeneity artifact which limits the lower extent of the exam, however within this limitation, collection measures 14.6 x 10.8 by 8.0 cm (series 16, image 23, series 46, image 89, series 43, image 38). Musculoskeletal: No acute or significant osseous findings. IMPRESSION: 1. Examination is generally limited by breath motion artifact throughout, which particularly degrades multiphasic contrast enhanced sequences, as well as coverage and field inhomogeneity issues. 2. Large, rim enhancing fluid collection situated in the low right retroperitoneum overlying the inner table of the abdominal wall musculature as well as the right iliacus. Unfortunately, this is incompletely imaged on several sequences due to scanner bore and field inhomogeneity artifact at the lower extent of the exam, however within this limitation, collection measures 14.6 x 10.8 by 8.0 cm. This appears to have slightly hemorrhagic or proteinaceous character and may reflect a large hematoma, seroma, or abscess. The presence or absence of infection is not established by imaging. 3. Cirrhosis and splenomegaly. Trace perihepatic ascites. 4. Status post cholecystectomy. Electronically Signed   By: Fredricka Jenny M.D.   On: 07/11/2023 16:04   MR PELVIS W WO CONTRAST Result Date: 07/11/2023 CLINICAL DATA:  Evaluate retroperitoneal  mass/fluid collection EXAM: MRI ABDOMEN AND PELVIS WITHOUT AND WITH CONTRAST TECHNIQUE: Multiplanar multisequence MR imaging of the abdomen and pelvis was performed both before and after the administration of intravenous contrast. CONTRAST:  9.9mL GADAVIST  GADOBUTROL  1 MMOL/ML IV SOLN COMPARISON:  CT abdomen pelvis, 07/11/2023, 10:07 a.m. FINDINGS: COMBINED FINDINGS FOR BOTH MR ABDOMEN AND PELVIS Examination is generally limited by breath motion artifact throughout, which particularly degrades multiphasic contrast enhanced sequences, as well as coverage and field inhomogeneity issues as detailed below. Lower chest: No acute abnormality. Hepatobiliary: No solid liver abnormality is seen. Coarse, nodular cirrhotic morphology of the liver. Status post cholecystectomy. No biliary ductal dilatation. Benign cyst in the peripheral right lobe of the liver, requiring no further follow-up or characterization. Pancreas: Unremarkable. No pancreatic ductal dilatation or surrounding inflammatory changes. Spleen: Splenomegaly, maximum coronal span 17.8 cm. Adrenals/Urinary Tract: Adrenal glands are unremarkable. Kidneys are normal, without renal calculi, solid lesion, or hydronephrosis. Bladder is unremarkable. Stomach/Bowel: Stomach is within normal limits. Appendix appears normal. No evidence of bowel wall thickening, distention, or inflammatory changes. Vascular/Lymphatic: Small varices throughout the left upper quadrant (series 33, image 46). No enlarged abdominal or pelvic lymph nodes. Reproductive: No mass or other significant abnormality. Other: No abdominal wall hernia or abnormality. Trace perihepatic ascites. Large, rim enhancing fluid collection situated in the low right retroperitoneum overlying the inner table of the abdominal wall musculature as well as the right iliacus musculature. Unfortunately, this is incompletely imaged on several sequences due to scanner bore and field inhomogeneity artifact which limits the  lower extent of the exam, however within this limitation, collection measures 14.6 x 10.8 by 8.0 cm (series 16, image 23, series 46, image 89, series 43, image 38). Musculoskeletal: No acute or significant osseous findings. IMPRESSION: 1. Examination is generally limited by breath motion artifact throughout, which particularly degrades multiphasic contrast enhanced sequences, as well as coverage and field inhomogeneity issues. 2. Large, rim enhancing fluid collection situated in the low right retroperitoneum overlying the inner table of the abdominal wall musculature as well as the right iliacus. Unfortunately, this is incompletely imaged on several sequences due to scanner bore and field inhomogeneity artifact at the lower extent of the exam, however within this limitation, collection measures 14.6 x 10.8 by 8.0 cm. This appears to have slightly hemorrhagic or proteinaceous character and may reflect a large hematoma, seroma, or abscess. The presence or absence of infection is not established by imaging. 3. Cirrhosis and splenomegaly. Trace perihepatic ascites. 4. Status post cholecystectomy. Electronically Signed   By: Fredricka Jenny M.D.   On: 07/11/2023 16:04   CT ABDOMEN PELVIS WO CONTRAST Result Date: 07/11/2023 CLINICAL DATA:  Abdominal pain. EXAM: CT ABDOMEN AND PELVIS WITHOUT CONTRAST TECHNIQUE: Multidetector CT imaging of the abdomen and pelvis was performed following the standard protocol without IV contrast. RADIATION DOSE REDUCTION: This exam was performed according to the departmental dose-optimization program which includes automated exposure control, adjustment of the mA and/or kV according to patient size and/or use of iterative reconstruction technique. COMPARISON:  CT abdomen pelvis dated 07/09/2023. FINDINGS: Evaluation of this exam is limited in the absence of intravenous contrast. Lower chest: Minimal bibasilar subpleural atelectasis or pleural thickening. No intra-abdominal free air.  Small  perihepatic free fluid. Hepatobiliary: Cirrhosis. There is a 6.2 cm right liver cyst. No biliary dilatation. Cholecystectomy. Pancreas: Unremarkable. No pancreatic ductal dilatation or surrounding inflammatory changes. Spleen: Splenomegaly measuring 18 cm in length. Adrenals/Urinary Tract: The adrenal glands unremarkable there is a punctate nonobstructing right renal  interpolar calculus. No hydronephrosis. The left kidney is unremarkable. The visualized ureters and urinary bladder appear unremarkable. Stomach/Bowel: There is moderate stool throughout the colon. There is colonic diverticulosis. There is no bowel obstruction or active inflammation. The appendix is normal. Vascular/Lymphatic: Moderate aortoiliac atherosclerotic disease. The IVC is unremarkable no portal venous gas. There is no adenopathy. Reproductive: Prostate brachytherapy seeds. There is multilobulated fluid attenuating structure in the right scrotum measuring 9 x 6 cm, not characterized on this CT. This may represent a hydrocele or a hypodense testicular mass. Ultrasound recommended for better evaluation. Thickened appearance of the posterior scrotal wall may represent displaced right testicle or a scrotal mass. Other: There is a 10 x 14 cm low attenuating mass in the right lateral lower abdomen abutting the iliacus muscle. This has increased in size since the prior CT. This mass is not characterized on this noncontrast CT but may represent a hematoma or a neoplastic process such as sarcoma or metastasis. MRI may provide better evaluation. Musculoskeletal: Osteopenia with degenerative changes of the spine. Old right posterior rib fractures. No acute osseous pathology. IMPRESSION: 1. Cirrhosis and Splenomegaly. 2. Punctate nonobstructing right renal interpolar calculus. No hydronephrosis. 3. Colonic diverticulosis. No bowel obstruction. Normal appendix. 4. Interval increase in the right lower abdomen retroperitoneal mass/hematoma. MRI may provide  better evaluation. 5. Low attenuating right scrotal mass. Ultrasound recommended for better evaluation. 6.  Aortic Atherosclerosis (ICD10-I70.0). Electronically Signed   By: Angus Bark M.D.   On: 07/11/2023 10:44   CT L-SPINE NO CHARGE Result Date: 07/09/2023 CLINICAL DATA:  Right-sided abdominal pain EXAM: CT LUMBAR SPINE WITHOUT CONTRAST TECHNIQUE: Multidetector CT imaging of the lumbar spine was performed without intravenous contrast administration. Multiplanar CT image reconstructions were also generated. RADIATION DOSE REDUCTION: This exam was performed according to the departmental dose-optimization program which includes automated exposure control, adjustment of the mA and/or kV according to patient size and/or use of iterative reconstruction technique. COMPARISON:  CT abdomen and pelvis same day FINDINGS: Segmentation: 5 lumbar type vertebrae. Alignment: Normal. Vertebrae: No acute fracture or focal pathologic process. There is chronic appearing fragmentation of the right L2 transverse process which may be related to old injury. There is a healed left L2 transverse process fracture. Transverse process Paraspinal and other soft tissues: Negative. Disc levels: There is mild disc space narrowing and endplate osteophyte formation throughout the lumbar spine compatible with degenerative change. T12-L1: No central canal or neural foraminal stenosis. L1-L2: There is bilateral facet arthropathy. No central canal or neural foraminal stenosis. L2-L3: There is bilateral facet arthropathy and thickening of the ligamentum flavum. Mild disc bulge present. There is moderate central canal stenosis. L3-L4: There is bilateral facet arthropathy with thickening of the ligamentum flavum and broad-based disc bulge. There is moderate central canal stenosis. There is mild right neural foraminal stenosis. L4-L5: There is bilateral facet arthropathy with thickening of the ligamentum flavum and broad-based disc bulge. There is  moderate central canal stenosis. There is mild bilateral neural foraminal stenosis. L5-S1: There is bilateral facet arthropathy. There is mild right neural foraminal stenosis. No central canal stenosis. Other: Stranding is seen along the intramuscular right pelvic sidewall. Please see CT of the abdomen and pelvis for further description. IMPRESSION: 1. No acute fracture or traumatic subluxation of the lumbar spine. 2. Multilevel degenerative changes of the lumbar spine with moderate central canal stenosis at L2-L3, L3-L4, and L4-L5. 3. Mild neural foraminal stenosis at L3-L4, L4-L5, and L5-S1. 4. Stranding along the intramuscular right pelvic sidewall. Please  see CT of the abdomen and pelvis for further description. Electronically Signed   By: Tyron Gallon M.D.   On: 07/09/2023 20:18   CT ABDOMEN PELVIS W CONTRAST Result Date: 07/09/2023 CLINICAL DATA:  Bowel obstruction suspected. Abdominal mass. Alcoholic cirrhosis. EXAM: CT ABDOMEN AND PELVIS WITH CONTRAST TECHNIQUE: Multidetector CT imaging of the abdomen and pelvis was performed using the standard protocol following bolus administration of intravenous contrast. RADIATION DOSE REDUCTION: This exam was performed according to the departmental dose-optimization program which includes automated exposure control, adjustment of the mA and/or kV according to patient size and/or use of iterative reconstruction technique. CONTRAST:  OMNIPAQUE  IOHEXOL  300 MG/ML  SOLN COMPARISON:  CT scan abdomen and pelvis from 12/03/2022. FINDINGS: Lower chest: The lung bases are clear. No pleural effusion. The heart is normal in size. No pericardial effusion. Hepatobiliary: The liver is normal in size. There is mild liver surface irregularity/nodularity, compatible with cirrhosis. No suspicious mass. Note is made of 6.2 x 6.4 cm cyst in the right hepatic lobe, which is stable since the prior study. No intrahepatic or extrahepatic bile duct dilation. Gallbladder is surgically  absent. Pancreas: Unremarkable. No pancreatic ductal dilatation or surrounding inflammatory changes. Spleen: Spleen is enlarged measuring upto 9.7 x 16.5 cm orthogonally on coronal plane. No focal lesion. Adrenals/Urinary Tract: Adrenal glands are unremarkable. No suspicious renal mass. There is a 2 mm nonobstructing calculus in the right kidney interpolar region. No other nephroureterolithiasis or obstructive uropathy on either side. Urinary bladder is under distended, precluding optimal assessment. However, no large mass or stones identified. No perivesical fat stranding. Stomach/Bowel: No disproportionate dilation of the small or large bowel loops. No evidence of abnormal bowel wall thickening or inflammatory changes. The appendix is unremarkable. There are scattered diverticula throughout the colon, without imaging signs of diverticulitis. Vascular/Lymphatic: There is an approximately 9.5 x 12.7 cm soft tissue attenuation collection in the right lower abdomen. Inferiorly, the collection is inseparable from the right iliopsoas muscle however, the epicenter of the collection is outside the muscle. There are few slightly hyperattenuating area along the posteroinferior aspect of the collection and small nodular slightly hyperattenuating area along the anterosuperior aspect of the collection. There is no discrete wall. Findings favor probable evolving hematoma. Correlate clinically. No abdominal or pelvic lymphadenopathy, by size criteria. No aneurysmal dilation of the major abdominal arteries. There are moderate peripheral atherosclerotic vascular calcifications of the aorta and its major branches. Reproductive: Normal size prostate. Symmetric seminal vesicles. Multiple radiation seeds noted in the prostate. There is partially visualized small-to-moderate right hydrocele. Other: There is a tiny fat containing umbilical hernia. The soft tissues and abdominal wall are otherwise unremarkable. Musculoskeletal: No  suspicious osseous lesions. There are moderate multilevel degenerative changes in the visualized spine. IMPRESSION: 1. There is an approximately 9.5 x 12.7 cm soft tissue attenuation collection in the right lower abdomen, as described above. Findings favor probable evolving hematoma. 2. No bowel obstruction. 3. Multiple other nonacute observations, as described above. Aortic Atherosclerosis (ICD10-I70.0). Electronically Signed   By: Beula Brunswick M.D.   On: 07/09/2023 11:31   DG Chest 2 View Result Date: 07/09/2023 CLINICAL DATA:  Short of breath, fatigue, fever, weight loss EXAM: CHEST - 2 VIEW COMPARISON:  12/08/2022 FINDINGS: Frontal and lateral views of the chest demonstrate an unremarkable cardiac silhouette. No acute airspace disease, effusion, or pneumothorax. No acute bony abnormalities. IMPRESSION: 1. No acute intrathoracic process. Electronically Signed   By: Bobbye Burrow M.D.   On: 07/09/2023 10:38  Labs:  CBC: Recent Labs    07/09/23 2104 07/10/23 0509 07/10/23 1608 07/11/23 0738 07/12/23 0644  WBC 14.2* 13.8*  --  12.5* 12.2*  HGB 10.5* 9.9* 9.4* 9.2* 9.1*  HCT 31.2* 30.7* 28.6* 27.7* 27.4*  PLT 186 175  --  164 192    COAGS: Recent Labs    12/03/22 1850 12/08/22 0834 07/09/23 0954 07/10/23 0509 07/11/23 0738 07/12/23 0644  INR 1.5*   < > 1.5* 1.4* 1.4* 1.4*  APTT 32  --   --   --   --   --    < > = values in this interval not displayed.    BMP: Recent Labs    07/09/23 1733 07/10/23 0509 07/11/23 0738 07/12/23 0644  NA 133* 134* 132* 132*  K 4.3 4.0 4.2 3.9  CL 98 102 101 101  CO2 25 24 24  21*  GLUCOSE 226* 226* 229* 204*  BUN 18 17 19 19   CALCIUM  8.6* 8.3* 8.1* 8.2*  CREATININE 1.02 1.13 1.19 0.97  GFRNONAA >60 >60 >60 >60    LIVER FUNCTION TESTS: Recent Labs    04/28/23 1212 07/09/23 0954 07/09/23 1733 07/10/23 0509  BILITOT 1.0 1.4* 1.4* 1.1  AST 16 12 17  14*  ALT 8 10 13 12   ALKPHOS 72 109 103 94  PROT 7.0 6.5 6.3* 5.8*  ALBUMIN   4.2 3.3* 2.6* 2.3*    TUMOR MARKERS: No results for input(s): "AFPTM", "CEA", "CA199", "CHROMGRNA" in the last 8760 hours.  Assessment and Plan:  Patient is with right lower quadrant hematoma/abscess scheduled for image guided percutaneous hematoma/abscess drain placement.  Imaging was reviewed and approved by Dr. Jinx Mourning.    Risks and benefits discussed with the patient including bleeding, infection, damage to adjacent structures, bowel perforation/fistula connection, and sepsis.  All of the patient's questions were answered, patient is agreeable to proceed. Consent signed and in chart.  Thank you for allowing our service to participate in Patrick Kidd 's care.  Electronically Signed: Pasty Bongo, PA-C   07/12/2023, 10:19 AM    I spent a total of 40 Minutes    in face to face in clinical consultation, greater than 50% of which was counseling/coordinating care for image guided percutaneous abscess/hematoma drain placement.

## 2023-07-12 NOTE — Progress Notes (Signed)
 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   Further history> non productive cough x 1 mo, decreased stamina, R lower abdominal and right inguinal pain, 3-4 wks fevers up to 102 with chills and sweats.   3 days of urinary hesitancy. Poor appetite and 10 lb wt loss.  He fell about 2 wks ago and had persistent right-sided back and flank pain.   CT> 9.5 x 12.7 collection in right lower abdomen favoring probable evolving hematoma. Splenomegaly 9.7 x 12.7 cm, 2 mm R interpolar renal calculus  WBC on 3/25 were 6.7 and on 6/5 were 15.3   Subjective:  Pain is well-controlled on oxycodone  however the patient does not remember that the radiology PA discussed with him this morning and does not remember that the RN just placed an external catheter on him.   Assessment and Plan: Principal Problem:   SIRS (systemic inflammatory response syndrome)  - Leukocytosis-WBC count is steadily improving - blood cx NGTD - Respiratory panel including COVID and influenza negative - UA negative - He has a dry cough which is improving with Tessalon -chest x-ray was clear and lower lungs on CT abdomen and pelvis were also clear -  no fever in > 24 hrs now  Active Problems:  R lower back and abdominal pain - Multiple 2 CT scan and MRI performed-has a large collection in the right retroperitoneal area and is still difficult to determine if this is a hematoma, seroma or abscess - He did fall on his right side about 2 weeks ago and has cirrhosis leading to elevated INR - He has  persistent right sacral tenderness and deep right lower abdominal tenderness - Avoid anticoagulants and following hemoglobin daily - Will continue Vanc and Cefepime    - IR consulted-the patient will have aspiration and possible drain placement tomorrow-cultures have been ordered  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 Elevated INR Intermittent thrombocytopenia - Platelets have been as low as 90 in the past but are normal during this hospital stay - Despite giving vitamin K, INR remains 1.4 - thrombocytopenia in the past was felt  by hematology(Dr Rosaline Coma [id number]) to be related to cirrhosis  Acute toxic encephalopathy, pain in R lower back and abdomen - Oxycodone  is causing significant memory loss and confusion - Have switched to tramadol  with Tylenol  twice daily -will need to avoid excessive Tylenol  due to cirrhosis and will need to avoid NSAIDs in the setting of elevated INR and due to the possibility of this fluid collection in his abdomen being a hematoma    Type 2 diabetes mellitus with complication, with long-term current use of insulin , uncontrolled - At home, he states that his sugars are usually less than 200 -he takes metformin , Toujeo  12 units daily but according to his wife, he does not always adhere to a diabetic diet - Semglee  and NovoLog  being given  - Sugars were in the 200s and therefore Semglee  increased      Chronic lymphocytic leukemia - Last evaluated in October 2024-did not need treatment at that time  Code Status: Full Code Total time on patient care: 35 min DVT prophylaxis:  Place and maintain sequential compression device Start: 07/09/23 2027     Objective:   Vitals:   07/11/23 2128 07/12/23 0013 07/12/23 0504 07/12/23 1338  BP:  134/63 (!) 160/60 (!) 155/110  Pulse:  99 (!) 112 (!) 109  Resp:  16 16 18   Temp: (!) 101.7 F (38.7 C) 97.8 F (36.6 C) 100 F (37.8 C) 99.4 F (37.4 C)  TempSrc: Oral Oral Oral Oral  SpO2:  95% 94% 95%   Weight:      Height:       Filed Weights   07/09/23 1810  Weight: 99.3 kg   Exam: General exam: Appears comfortable Neuro: Quite confused this morning around 8 to 9 AM but oriented times 3 by 5 p.m. HEENT: oral mucosa moist Respiratory system: Clear to auscultation.  Cardiovascular system: S1 & S2 heard  Gastrointestinal system: Abdomen soft, non-tender, nondistended. Normal bowel sounds   Extremities: No cyanosis, clubbing or edema Psychiatry:  Mood & affect appropriate.       CBC: Recent Labs  Lab 07/09/23 0954 07/09/23 1425 07/09/23 2104 07/10/23 0509 07/10/23 1608 07/11/23 0738 07/12/23 0644  WBC 15.3* 15.7* 14.2* 13.8*  --  12.5* 12.2*  NEUTROABS 11.6*  --   --   --   --   --   --   HGB 11.3* 10.7* 10.5* 9.9* 9.4* 9.2* 9.1*  HCT 33.2* 32.1* 31.2* 30.7* 28.6* 27.7* 27.4*  MCV 88.0 90.7 89.9 94.8  --  91.7 91.9  PLT 226.0 191 186 175  --  164 192   Basic Metabolic Panel: Recent Labs  Lab 07/09/23 0954 07/09/23 1733 07/10/23 0509 07/11/23 0738 07/12/23 0644  NA 136 133* 134* 132* 132*  K 4.6 4.3 4.0 4.2 3.9  CL 96 98 102 101 101  CO2 27 25 24 24  21*  GLUCOSE 191* 226* 226* 229* 204*  BUN 15 18 17 19 19   CREATININE 1.00 1.02 1.13 1.19 0.97  CALCIUM  9.1 8.6* 8.3* 8.1* 8.2*     Scheduled Meds:  acetaminophen   650 mg Oral BID   benzonatate   100 mg Oral TID   feeding supplement  1 Container Oral TID BM   gabapentin   300 mg Oral TID   insulin  aspart  0-15 Units Subcutaneous TID WC   insulin  glargine-yfgn  16 Units Subcutaneous Daily   lidocaine   1 patch Transdermal Q24H   multivitamin with minerals  1 tablet Oral Daily   pantoprazole   40 mg Oral BID    Imaging and lab data personally reviewed   Author: Sharmayne Jablon  07/12/2023 2:45 PM  To contact [facility]>   Check the care team in [facility] and look for the attending/consulting [facility] provider listed  Log into [email or web address] and use [facility]'s universal password   Go to> "[facility]"   and find provider  If you still have difficulty reaching the provider, please page the [facility] (Director on Call) for the Hospitalists listed on amion

## 2023-07-13 ENCOUNTER — Inpatient Hospital Stay (HOSPITAL_COMMUNITY)

## 2023-07-13 ENCOUNTER — Inpatient Hospital Stay

## 2023-07-13 ENCOUNTER — Inpatient Hospital Stay (HOSPITAL_BASED_OUTPATIENT_CLINIC_OR_DEPARTMENT_OTHER): Admitting: Hematology and Oncology

## 2023-07-13 DIAGNOSIS — C9110 Chronic lymphocytic leukemia of B-cell type not having achieved remission: Secondary | ICD-10-CM

## 2023-07-13 DIAGNOSIS — K6819 Other retroperitoneal abscess: Secondary | ICD-10-CM | POA: Diagnosis not present

## 2023-07-13 DIAGNOSIS — R6510 Systemic inflammatory response syndrome (SIRS) of non-infectious origin without acute organ dysfunction: Secondary | ICD-10-CM | POA: Diagnosis not present

## 2023-07-13 LAB — BASIC METABOLIC PANEL WITH GFR
Anion gap: 8 (ref 5–15)
Anion gap: 11 (ref 5–15)
BUN: 19 mg/dL (ref 8–23)
BUN: 18 mg/dL (ref 8–23)
CO2: 23 mmol/L (ref 22–32)
CO2: 22 mmol/L (ref 22–32)
Calcium: 8.3 mg/dL — ABNORMAL LOW (ref 8.9–10.3)
Calcium: 8.5 mg/dL — ABNORMAL LOW (ref 8.9–10.3)
Chloride: 99 mmol/L (ref 98–111)
Chloride: 100 mmol/L (ref 98–111)
Creatinine, Ser: 0.96 mg/dL (ref 0.61–1.24)
Creatinine, Ser: 0.93 mg/dL (ref 0.61–1.24)
GFR, Estimated: >60 mL/min (ref >60)
GFR, Estimated: >60 mL/min (ref >60)
Glucose, Bld: 201 mg/dL — ABNORMAL HIGH (ref 70–99)
Glucose, Bld: 159 mg/dL — ABNORMAL HIGH (ref 70–99)
Potassium: 3.8 mmol/L (ref 3.5–5.1)
Potassium: 3.7 mmol/L (ref 3.5–5.1)
Sodium: 130 mmol/L — ABNORMAL LOW (ref 135–145)
Sodium: 133 mmol/L — ABNORMAL LOW (ref 135–145)

## 2023-07-13 LAB — CBC
HCT: 27 % — ABNORMAL LOW (ref 39.0–52.0)
Hemoglobin: 9.1 g/dL — ABNORMAL LOW (ref 13.0–17.0)
MCH: 30.4 pg (ref 26.0–34.0)
MCHC: 33.7 g/dL (ref 30.0–36.0)
MCV: 90.3 fL (ref 80.0–100.0)
Platelets: 170 10*3/uL (ref 150–400)
RBC: 2.99 MIL/uL — ABNORMAL LOW (ref 4.22–5.81)
RDW: 12 % (ref 11.5–15.5)
WBC: 12.5 10*3/uL — ABNORMAL HIGH (ref 4.0–10.5)
nRBC: 0 % (ref 0.0–0.2)

## 2023-07-13 LAB — AMMONIA: Ammonia: 19 umol/L (ref 9–35)

## 2023-07-13 LAB — GLUCOSE, CAPILLARY
Glucose-Capillary: 194 mg/dL — ABNORMAL HIGH (ref 70–99)
Glucose-Capillary: 177 mg/dL — ABNORMAL HIGH (ref 70–99)
Glucose-Capillary: 153 mg/dL — ABNORMAL HIGH (ref 70–99)
Glucose-Capillary: 174 mg/dL — ABNORMAL HIGH (ref 70–99)

## 2023-07-13 MED ORDER — PIPERACILLIN-TAZOBACTAM 3.375 G IVPB
3.3750 g | Freq: Three times a day (TID) | INTRAVENOUS | Status: DC
  Administered 2023-07-13 – 2023-07-17 (×12): 3.375 g via INTRAVENOUS
  Filled 2023-07-13 (×12): qty 50

## 2023-07-13 MED ORDER — MIDAZOLAM HCL 2 MG/2ML IJ SOLN
INTRAMUSCULAR | Status: AC
Start: 2023-07-13 — End: ?
  Filled 2023-07-13: qty 4

## 2023-07-13 MED ORDER — SODIUM CHLORIDE 0.9% FLUSH
5.0000 mL | Freq: Three times a day (TID) | INTRAVENOUS | Status: DC
  Administered 2023-07-13 – 2023-07-20 (×14): 5 mL

## 2023-07-13 MED ORDER — FENTANYL CITRATE (PF) 100 MCG/2ML IJ SOLN
INTRAMUSCULAR | Status: AC | PRN
  Administered 2023-07-13 (×2): 50 ug via INTRAVENOUS

## 2023-07-13 MED ORDER — FENTANYL CITRATE (PF) 100 MCG/2ML IJ SOLN
INTRAMUSCULAR | Status: AC
  Filled 2023-07-13: qty 4

## 2023-07-13 MED ORDER — HYDROCODONE-ACETAMINOPHEN 5-325 MG PO TABS: 1.0000 | ORAL_TABLET | Freq: Four times a day (QID) | ORAL | Status: DC | PRN

## 2023-07-13 MED ORDER — MIDAZOLAM HCL 2 MG/2ML IJ SOLN
INTRAMUSCULAR | Status: AC | PRN
  Administered 2023-07-13 (×2): 1 mg via INTRAVENOUS

## 2023-07-13 NOTE — Progress Notes (Signed)
 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   Further history> non productive cough x 1 mo, decreased stamina, R lower abdominal and right inguinal pain, 3-4 wks fevers up to 102 with chills and sweats.   3 days of urinary hesitancy. Poor appetite and 10 lb wt loss.  He fell about 2 wks ago and had persistent right-sided back and flank pain.   CT> 9.5 x 12.7 collection in right lower abdomen favoring probable evolving hematoma. Splenomegaly 9.7 x 12.7 cm, 2 mm R interpolar renal calculus  WBC on 3/25 were 6.7 and on 6/5 were 15.3   Subjective:  Evaluated this morning.  He was just waking up and stated that his pain was well-controlled.  He had no complaints.  Assessment and Plan: Principal Problem:   SIRS (systemic inflammatory response syndrome)  - Leukocytosis-WBC count is steadily improving - blood cx NGTD - Respiratory panel including COVID and influenza negative - UA negative - He has a dry cough which is improving with Tessalon -chest x-ray was clear and lower lungs on CT abdomen and pelvis were also clear - Blood cultures negative-DC vancomycin   - Due to continued confusion, change Cefepime  to Zosyn  - Still having fevers of 101.9 -as his fever is not resolving therefore I have asked IR to drain the collection in the right retroperitoneal area   Active Problems:  R lower back and abdominal pain - Multiple 2 CT scan and MRI performed-has a large collection in the right retroperitoneal area and is still difficult to determine if this is a  hematoma, seroma or abscess - He did fall on his right side about 2 weeks ago and has cirrhosis leading to elevated INR - He has persistent right sacral tenderness and deep right lower abdominal tenderness - Avoid anticoagulants and following hemoglobin daily - IR consulted-the patient will have aspiration and possible drain placement today-will follow-up on this  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 Elevated INR Intermittent thrombocytopenia - Platelets have been as low as 90 in the past but are normal during this hospital stay - Despite giving vitamin K, INR remains 1.4 - thrombocytopenia in the past was felt  by hematology(Dr Rosaline Coma [id number]) to be related to cirrhosis  Acute toxic encephalopathy  - Oxycodone , 10 mg given and this caused him to be quite confused-lowered to 5 mg but he remained confused therefore discontinued - Have switched to tramadol  with Tylenol  twice daily as needed -will need to avoid excessive Tylenol  due to cirrhosis and will need to avoid NSAIDs in the setting of elevated INR and due to the possibility of this fluid collection in his abdomen being a hematoma - Still confused today-DC cefepime  in favor of Zosyn     Type 2 diabetes mellitus with complication, with long-term current use of insulin , uncontrolled - At home, he states that his sugars are usually less than 200 -he takes metformin , Toujeo  12 units daily but according to his wife, he does not always adhere to a diabetic diet - Semglee  and NovoLog  being given  -  Sugars were in the 200s and therefore Semglee  increased-sugar likely higher than usual due to higher levels of cortisol in the hospital, as he has had no physical activity in the hospital & may have an underlying infection      Chronic lymphocytic leukemia - Last evaluated in October 2024-did not need treatment at that time      Code Status: Full Code Total time on patient care: 35 min DVT prophylaxis:  Place and maintain sequential  compression device Start: 07/09/23 2027     Objective:   Vitals:   07/12/23 2252 07/13/23 0131 07/13/23 0506 07/13/23 1000  BP: (!) 133/51 (!) 148/71 (!) 157/83   Pulse: (!) 113 100 (!) 109   Resp: 16 16 16    Temp: (!) 100.9 F (38.3 C) 99.4 F (37.4 C) 99.5 F (37.5 C) 98.9 F (37.2 C)  TempSrc: Oral Oral Oral Oral  SpO2: 92% 95% 96%   Weight:      Height:       Filed Weights   07/09/23 1810  Weight: 99.3 kg   Exam: General exam: Appears comfortable Neuro: Still confused this morning and having memory deficit  HEENT: oral mucosa moist Respiratory system: Clear to auscultation.  Cardiovascular system: S1 & S2 heard  Gastrointestinal system: Abdomen soft, non-tender, nondistended. Normal bowel sounds   Extremities: No cyanosis, clubbing or edema Psychiatry: Quite pleasant.  Mood & affect appropriate.       CBC: Recent Labs  Lab 07/09/23 0954 07/09/23 1425 07/09/23 2104 07/10/23 0509 07/10/23 1608 07/11/23 0738 07/12/23 0644 07/13/23 0546  WBC 15.3*   < > 14.2* 13.8*  --  12.5* 12.2* 12.5*  NEUTROABS 11.6*  --   --   --   --   --   --   --   HGB 11.3*   < > 10.5* 9.9* 9.4* 9.2* 9.1* 9.1*  HCT 33.2*   < > 31.2* 30.7* 28.6* 27.7* 27.4* 27.0*  MCV 88.0   < > 89.9 94.8  --  91.7 91.9 90.3  PLT 226.0   < > 186 175  --  164 192 170   < > = values in this interval not displayed.   Basic Metabolic Panel: Recent Labs  Lab 07/09/23 1733 07/10/23 0509 07/11/23 0738 07/12/23 0644 07/13/23 0546  NA 133* 134* 132* 132* 130*  K 4.3 4.0 4.2 3.9 3.8  CL 98 102 101 101 99  CO2 25 24 24  21* 23  GLUCOSE 226* 226* 229* 204* 201*  BUN 18 17 19 19 19   CREATININE 1.02 1.13 1.19 0.97 0.96  CALCIUM  8.6* 8.3* 8.1* 8.2* 8.3*     Scheduled Meds:  acetaminophen   650 mg Oral BID   benzonatate   100 mg Oral TID   feeding supplement  1 Container Oral TID BM   gabapentin   300 mg Oral TID   insulin  aspart  0-15 Units Subcutaneous TID WC   insulin  glargine-yfgn  16 Units  Subcutaneous Daily   lidocaine   1 patch Transdermal Q24H   multivitamin with minerals  1 tablet Oral Daily   pantoprazole   40 mg Oral BID    Imaging and lab data personally reviewed   Author: Chevonne Bostrom  07/13/2023 1:42 PM  To contact [facility]>   Check the care team in [facility] and look for the attending/consulting [facility] provider listed  Log into [email or web address] and use [facility]'s universal password   Go to> "[facility]"  and find provider  If you still have difficulty  reaching the provider, please page the [facility] (Director on Call) for the Hospitalists listed on amion

## 2023-07-13 NOTE — Progress Notes (Signed)
Missed visit due to hospitalization

## 2023-07-13 NOTE — Progress Notes (Signed)
 Physical Therapy Treatment Patient Details Name: Patrick Kidd MRN: [id number] DOB: [birth year] Today's Date: 07/13/2023   History of Present Illness Pt is a 78yo male presenting to the ED for management of abdominal hematoma, night sweats; fell on his back ~2 weeks ago with CT lumbar spine with moderate central canal stenosis multi-level without acute fx or traumatic sublux. PMH: acrophobia, alcoholic cirrhosis, anemia, BPH, CLL, DM, GERD, gout, peripheral neuropathy, hx of prostate cancer, "back surgery,"  Wolff-Parkinson-White syndrome    PT Comments  Pt reports 6/10 back pain at rest and with walking. Pain medication requested. Pt ambulated 50' with RW, no loss of balance, distance limited by pain and fatigue. Pt put forth good effort.     If plan is discharge home, recommend the following: A little help with walking and/or transfers;A little help with bathing/dressing/bathroom;Help with stairs or ramp for entrance;Supervision due to cognitive status;Assist for transportation   Can travel by private vehicle        Equipment Recommendations  None recommended by PT    Recommendations for Other Services       Precautions / Restrictions Precautions Precautions: Fall Precaution/Restrictions Comments: L[place] S[place] ago, injured R lower quadrant of back, points to R SIJ Restrictions Weight Bearing Restrictions Per Provider Order: No     Mobility  Bed Mobility Overal bed mobility: Needs Assistance Bed Mobility: Rolling, Sidelying to Sit Rolling: Min assist Sidelying to sit: Min assist       General bed mobility comments: cues for rolling to not twist    Transfers Overall transfer level: Needs assistance   Transfers: Sit to/from Stand Sit to Stand: Contact guard assist, From elevated surface           General transfer comment: VCs hand placement, pt did not respond to command    Ambulation/Gait Ambulation/Gait assistance: Contact guard assist Gait Distance (Feet):  50 Feet Assistive device: Rolling walker (2 wheels) Gait Pattern/deviations: Step-to pattern, Trunk flexed Gait velocity: decreased     General Gait Details: VCs for proximity to RW, distance limited by pain/fatigue, no loss of balance   Stairs             Wheelchair Mobility     Tilt Bed    Modified Rankin (Stroke Patients Only)       Balance Overall balance assessment: Needs assistance, History of Falls Sitting-balance support: Feet supported, No upper extremity supported Sitting balance-Leahy Scale: Fair     Standing balance support: During functional activity, Reliant on assistive device for balance, Bilateral upper extremity supported Standing balance-Leahy Scale: Fair                              Hotel manager: No apparent difficulties  Cognition Arousal: Alert Behavior During Therapy: WFL for tasks assessed/performed                           PT - Cognition Comments: pt stated he's at [facility] hospital; did not respond to command for safe hand placement for sit to stand   Following commands impaired: Follows one step commands with increased time, Follows one step commands inconsistently    Cueing Cueing Techniques: Verbal cues, Tactile cues  Exercises      General Comments        Pertinent Vitals/Pain Pain Assessment Pain Score: 6  Pain Location: R SIJ area Pain Descriptors / Indicators: Sore Pain Intervention(s): Limited activity  within patient's tolerance, Monitored during session, Repositioned, Patient requesting pain meds-RN notified    Home Living                          Prior Function            PT Goals (current goals can now be found in the care plan section) Acute Rehab PT Goals Patient Stated Goal: get stronger, be able to walk in [place] PT Goal Formulation: With family Potential to Achieve Goals: Good Progress towards PT goals: Progressing toward goals     Frequency    Min 3X/week      PT Plan      Co-evaluation              AM-PAC PT "6 Clicks" Mobility   Outcome Measure  Help needed turning from your back to your side while in a flat bed without using bedrails?: A Little Help needed moving from lying on your back to sitting on the side of a flat bed without using bedrails?: A Little Help needed moving to and from a bed to a chair (including a wheelchair)?: A Little Help needed standing up from a chair using your arms (e.g., wheelchair or bedside chair)?: A Little Help needed to walk in hospital room?: A Little Help needed climbing 3-5 steps with a railing? : A Lot 6 Click Score: 17    End of Session Equipment Utilized During Treatment: Gait belt Activity Tolerance: Patient limited by fatigue;Patient limited by pain Patient left: in bed;with call bell/phone within reach;with family/visitor present Nurse Communication: Mobility status PT Visit Diagnosis: Unsteadiness on feet (R26.81);History of falling (Z91.81);Difficulty in walking, not elsewhere classified (R26.2)     Time: 1610-9604 PT Time Calculation (min) (ACUTE ONLY): 45 min  Charges:    $Gait Training: 8-22 mins $Therapeutic Activity: 8-22 mins                       Lynann Sandman Kistler PT 07/13/2023  Acute Rehabilitation Services  Office [phone]

## 2023-07-13 NOTE — Inpatient Diabetes Management (Signed)
 Inpatient Diabetes Program Recommendations  AACE/ADA: New Consensus Statement on Inpatient Glycemic Control (2015)  Target Ranges:  Prepandial:   less than 140 mg/dL      Peak postprandial:   less than 180 mg/dL (1-2 hours)      Critically ill patients:  140 - 180 mg/dL   Lab Results  Component Value Date   GLUCAP 194 (H) 07/13/2023   HGBA1C 6.8 (H) 04/28/2023    Review of Glycemic Control  Latest Reference Range & Units 07/12/23 07:15 07/12/23 11:09 07/12/23 16:54 07/12/23 21:11 07/13/23 07:49  Glucose-Capillary 70 - 99 mg/dL 161 (H) 096 (H) 045 (H) 220 (H) 194 (H)   Diabetes history: DM 2 Outpatient Diabetes medications: Toujeo  12 units Daily, metformin  500 mg bid Current orders for Inpatient glycemic control:  Semglee  16 units Novolog  0-15 units tid  A1c 6.8% on 3/25 Boost tid between meals  Inpatient Diabetes Program Recommendations:    -   may consider Novolog  2 units tid meal coverage if eating >50% of meals  Thanks,  Eloise Hake RN, MSN, BC-ADM Inpatient Diabetes Coordinator Team Pager [phone] (8a-5p)

## 2023-07-13 NOTE — Sedation Documentation (Signed)
 RN Edder Bellanca pulled 2mg  Versed  and 100mcg Fentanyl  in IR med room pysix. Pt. Received 2mg  Versed  and 100mcg Fentanyl  throughout the procedure.

## 2023-07-13 NOTE — Progress Notes (Signed)
 Rn informed by NT that pt pulled off dressing to JP drain. Site was assessed, no bleeding noted, dressing reinforced and house coverage J. Sharion Davidson informed. Pt stated to c/o pain at insertion site, house coverage was asked for pain medication and pt repositioned.

## 2023-07-13 NOTE — Procedures (Signed)
 Vascular and Interventional Radiology Procedure Note  Patient: Patrick Kidd DOB: 08/19/1945 Medical Record Number: [id number] Note Date/Time: 07/13/23 4:18 PM   Performing Physician: Art Largo, MD Assistant(s): None  Diagnosis: R flank / RP collection. Q abscess   Procedure: DRAINAGE CATHETER PLACEMENT into a RIGHT FLANK RETROPERITONEAL ABSCESS  Anesthesia: Conscious Sedation Complications: None Estimated Blood Loss: Minimal Specimens: Sent for Gram Stain, Aerobe Culture, Anerobe Culture, and Cytology  Findings:  Successful CT-guided placement of 10 F catheter into a R flank / RP collection .  Plan:  - Flush drain with 5 mL Normal Saline every 8 hours. - Follow up drain evaluation / sinogram in 7 day(s).  See detailed procedure note with images in PACS. The patient tolerated the procedure well without incident or complication and was returned to Recovery in stable condition.    Art Largo, MD [facility] Rad[facility]   Pager. [phone] Clinic. [phone]

## 2023-07-14 DIAGNOSIS — R6510 Systemic inflammatory response syndrome (SIRS) of non-infectious origin without acute organ dysfunction: Secondary | ICD-10-CM | POA: Diagnosis not present

## 2023-07-14 LAB — CULTURE, BLOOD (ROUTINE X 2)
Culture: NO GROWTH
Culture: NO GROWTH

## 2023-07-14 LAB — CBC
HCT: 28.6 % — ABNORMAL LOW (ref 39.0–52.0)
Hemoglobin: 9.7 g/dL — ABNORMAL LOW (ref 13.0–17.0)
MCH: 30.3 pg (ref 26.0–34.0)
MCHC: 33.9 g/dL (ref 30.0–36.0)
MCV: 89.4 fL (ref 80.0–100.0)
Platelets: 173 10*3/uL (ref 150–400)
RBC: 3.2 MIL/uL — ABNORMAL LOW (ref 4.22–5.81)
RDW: 11.9 % (ref 11.5–15.5)
WBC: 11.4 10*3/uL — ABNORMAL HIGH (ref 4.0–10.5)
nRBC: 0 % (ref 0.0–0.2)

## 2023-07-14 LAB — GLUCOSE, CAPILLARY
Glucose-Capillary: 137 mg/dL — ABNORMAL HIGH (ref 70–99)
Glucose-Capillary: 190 mg/dL — ABNORMAL HIGH (ref 70–99)
Glucose-Capillary: 138 mg/dL — ABNORMAL HIGH (ref 70–99)
Glucose-Capillary: 252 mg/dL — ABNORMAL HIGH (ref 70–99)

## 2023-07-14 LAB — HEMOGLOBIN A1C
Hgb A1c MFr Bld: 7.7 % — ABNORMAL HIGH (ref 4.8–5.6)
Mean Plasma Glucose: 174 mg/dL

## 2023-07-14 MED ORDER — TRAMADOL HCL 50 MG PO TABS
50.0000 mg | ORAL_TABLET | Freq: Two times a day (BID) | ORAL | Status: DC | PRN
  Administered 2023-07-14 – 2023-07-17 (×6): 50 mg via ORAL
  Filled 2023-07-14 (×7): qty 1

## 2023-07-14 MED ORDER — HYDROCODONE-ACETAMINOPHEN 5-325 MG PO TABS
1.0000 | ORAL_TABLET | Freq: Four times a day (QID) | ORAL | Status: DC | PRN
  Administered 2023-07-14: 1 via ORAL
  Filled 2023-07-14: qty 1

## 2023-07-14 NOTE — Plan of Care (Signed)
 Night shift reported pt was confused over night, pulling at lines, having 8/10 pain. & will not let us  flush it d/t pain. Night shift nurse redressed the site and pt removed it twice. He is concerned about the pain and placement. RN Redressed the site, abdominal binder in place so pt will not tug at drain. Wife at the bedside.  Problem: Education: Goal: Knowledge of General Education information will improve Description: Including pain rating scale, medication(s)/side effects and non-pharmacologic comfort measures Outcome: Progressing   Problem: Health Behavior/Discharge Planning: Goal: Ability to manage health-related needs will improve Outcome: Progressing   Problem: Activity: Goal: Risk for activity intolerance will decrease Outcome: Progressing   Problem: Nutrition: Goal: Adequate nutrition will be maintained Outcome: Progressing   Problem: Coping: Goal: Level of anxiety will decrease Outcome: Progressing   Problem: Elimination: Goal: Will not experience complications related to bowel motility Outcome: Progressing

## 2023-07-14 NOTE — Plan of Care (Signed)

## 2023-07-14 NOTE — Plan of Care (Signed)
   Problem: Nutrition: Goal: Adequate nutrition will be maintained Outcome: Progressing   Problem: Elimination: Goal: Will not experience complications related to bowel motility Outcome: Progressing   Problem: Pain Managment: Goal: General experience of comfort will improve and/or be controlled Outcome: Progressing   Problem: Safety: Goal: Ability to remain free from injury will improve Outcome: Progressing

## 2023-07-14 NOTE — Progress Notes (Signed)
 Pt c/o pain with flushing JP drain this moring, site is clean and dry, dressing was reinforced earlier in shift. Hospitalist was notified of pt's complaint.

## 2023-07-14 NOTE — Progress Notes (Signed)
 Mobility Specialist - Progress Note   07/14/23 0950  Mobility  Activity Ambulated with assistance in hallway  Level of Assistance Standby assist, set-up cues, supervision of patient - no hands on  Assistive Device Front wheel walker  Distance Ambulated (ft) 80 ft  Activity Response Tolerated well  Mobility Referral Yes  Mobility visit 1 Mobility  Mobility Specialist Start Time (ACUTE ONLY) 0940  Mobility Specialist Stop Time (ACUTE ONLY) 0950  Mobility Specialist Time Calculation (min) (ACUTE ONLY) 10 min   Pt received in bed and agreeable to mobility. No complaints during session. Pt to EOB after session with all needs met. MD in room.    [facility]

## 2023-07-14 NOTE — Progress Notes (Signed)
   07/14/23 1441  TOC Brief Assessment  Insurance and Status Reviewed (Medicare A & B)  Patient has primary care physician Yes Rochelle Chu, Randa Burton, MD)  Home environment has been reviewed from home with spouse  Prior level of function: Modified independent  Prior/Current Home Services No current home services  Social Drivers of Health Review SDOH reviewed no interventions necessary  Readmission risk has been reviewed Yes  Transition of care needs no transition of care needs at this time   Has home DME wheelchair, cane & walker. Declines Home health services at this time.

## 2023-07-14 NOTE — Progress Notes (Addendum)
 Patient ID: Patrick Kidd, male   DOB: Jul 19, 1945, 78 y.o.   MRN: [id number] Patient status post right retroperitoneal abscess drain placement yesterday.  Nurse reports that patient was pulling at drain site overnight.  Patient now has significant pain with drain irrigation.  Drain appears to be retracted on exam today.  Drain output yesterday 100 cc, 10 cc today.  Patient afebrile, drain fluid cultures with moderate Klebsiella; WBC 11.4 down from 12.5, hemoglobin stable.  Will obtain follow-up CT abdomen pelvis to assess drain site. Hold on drain irrigation until after f/u CT.

## 2023-07-14 NOTE — Progress Notes (Signed)
 [facility] Progress Note  Patient: Patrick Kidd     ZHY:[id number]  DOA: 07/09/2023   PCP: [name], MD       Brief hospital course: 78 y/o male with CLL, alcoholic and NASH cirrhosis with hepatic encephalopathy, thrombocytopenia, portal HTN,  H/o SBP in 2020, DM, peripheral neuropathy, prostate cancer, BPH, chronic back pain and osteoarthritis presented to the ED for an abnormal CT of the abd/pelvis.   Fevers started about 3 to 4 weeks ago-he did not seek evaluation by a medical professions-he subsequently lost his appetite and became weak-he then fell on his right hip about 2 weeks ago and went to see his GI doctor on 6/5 for further work up of persistent fevers of 102, sweats, weakness, lack of appetite and right sided back and abdominal pain.  CT abd/pelvis ordered by GI for further w/u.   CT> 9.5 x 12.7 collection in right lower abdomen favoring probable evolving hematoma. Splenomegaly 9.7 x 12.7 cm, 2 mm R interpolar renal calculus  Fevers did not resolve despite IV antibiotics and blood cultures were negative.  IR drain placed on 6/9.  Hospital course has been complicated by confusion and agitation exacerbated by the narcotics he is required for his pain.  Subjective:   Assessment and Plan: Principal Problem: Sepsis-retroperitoneal infection-possibly infected hematoma - Started on vancomycin  and cefepime  in the ED - Blood cultures negative-DC'd vancomycin  on 6/8 - 6/9 due to continued confusion, change Cefepime  to Zosyn  - Eventually, I requested IR to drain the fluid collection and this was done on 6/9-60 cc of purulent fluid obtained initially and 250 cc drained so far - Klebsiella pneumonia growing from fluid culture-continue Zosyn   Active Problems:  Acute toxic encephalopathy  -  exacerbated by narcotics - Cefepime  changed to Zosyn  in case it was causing some of his confusion - still difficult to find a pain medication- unable to give narcotics due to  confusion. NSAIDs avoided as the retroperitoneal fluid collection was felt to be a hematoma - cannot give too much Tylenol  due to Cirrhosis - currently on Tylenol  650mg  BID and Tramadol  50 mg BID BID  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 Elevated INR Intermittent thrombocytopenia - Platelets have been as low as 90 in the past but are normal during this hospital stay - Despite giving vitamin K, INR remains 1.4 - thrombocytopenia in the past was felt  by hematology(Dr Rosaline Coma 10//[id number]) to be related to cirrhosis    Type 2 diabetes mellitus with complication, with long-term current use of insulin , uncontrolled - At home, he states that his sugars are usually less than 200 -he takes metformin , Toujeo  12 units daily but according to his wife, he does not always adhere to a diabetic diet - Semglee  and NovoLog  being given        Chronic lymphocytic leukemia - Last evaluated in October 2024-did not need treatment at that time      Code Status: Full Code Total time on patient care: 35 min DVT prophylaxis:  Place and maintain sequential compression device Start: 07/09/23 2027     Objective:   Vitals:   07/13/23 1650 07/13/23 2109 07/14/23 0642 07/14/23 1354  BP: (!) 159/69 (!) 164/83 (!) 177/75 137/70  Pulse: 92 (!) 103 (!) 105 87  Resp: 20 17 18 16   Temp:  98.7 F (37.1 C) 99 F (37.2 C) 97.9 F (36.6 C)  TempSrc:  Oral Oral Oral  SpO2: 99% 96% 96% 97%  Weight:  Height:       Filed Weights   07/09/23 1810  Weight: 99.3 kg   Exam: General exam: Appears comfortable Neuro: Still confused this morning and having memory deficit  HEENT: oral mucosa moist Respiratory system: Clear to auscultation.  Cardiovascular system: S1 & S2 heard  Gastrointestinal system: Abdomen soft, non-tender, nondistended. Normal bowel sounds   Extremities: No cyanosis, clubbing or edema Psychiatry: Quite pleasant.  Mood & affect appropriate.       CBC: Recent Labs  Lab  07/09/23 0954 07/09/23 1425 07/10/23 0509 07/10/23 1608 07/11/23 0738 07/12/23 0644 07/13/23 0546 07/14/23 0551  WBC 15.3*   < > 13.8*  --  12.5* 12.2* 12.5* 11.4*  NEUTROABS 11.6*  --   --   --   --   --   --   --   HGB 11.3*   < > 9.9* 9.4* 9.2* 9.1* 9.1* 9.7*  HCT 33.2*   < > 30.7* 28.6* 27.7* 27.4* 27.0* 28.6*  MCV 88.0   < > 94.8  --  91.7 91.9 90.3 89.4  PLT 226.0   < > 175  --  164 192 170 173   < > = values in this interval not displayed.   Basic Metabolic Panel: Recent Labs  Lab 07/10/23 0509 07/11/23 0738 07/12/23 0644 07/13/23 0546 07/13/23 1804  NA 134* 132* 132* 130* 133*  K 4.0 4.2 3.9 3.8 3.7  CL 102 101 101 99 100  CO2 24 24 21* 23 22  GLUCOSE 226* 229* 204* 201* 159*  BUN 17 19 19 19 18   CREATININE 1.13 1.19 0.97 0.96 0.93  CALCIUM  8.3* 8.1* 8.2* 8.3* 8.5*     Scheduled Meds:  acetaminophen   650 mg Oral BID   benzonatate   100 mg Oral TID   feeding supplement  1 Container Oral TID BM   gabapentin   300 mg Oral TID   insulin  aspart  0-15 Units Subcutaneous TID WC   insulin  glargine-yfgn  16 Units Subcutaneous Daily   lidocaine   1 patch Transdermal Q24H   multivitamin with minerals  1 tablet Oral Daily   pantoprazole   40 mg Oral BID   sodium chloride  flush  5 mL Intracatheter Q8H    Imaging and lab data personally reviewed   Author: Raya Mckinstry  07/14/2023 4:45 PM  To contact [facility]>   Check the care team in [facility] and look for the attending/consulting [facility] provider listed  Log into [email or web address] and use [facility] universal password   Go to> "[facility]"  and find provider  If you still have difficulty reaching the provider, please page the [facility] (Director on Call) for the Hospitalists listed on amion

## 2023-07-15 ENCOUNTER — Inpatient Hospital Stay (HOSPITAL_COMMUNITY)

## 2023-07-15 DIAGNOSIS — R161 Splenomegaly, not elsewhere classified: Secondary | ICD-10-CM | POA: Diagnosis not present

## 2023-07-15 DIAGNOSIS — R6510 Systemic inflammatory response syndrome (SIRS) of non-infectious origin without acute organ dysfunction: Secondary | ICD-10-CM | POA: Diagnosis not present

## 2023-07-15 DIAGNOSIS — K7689 Other specified diseases of liver: Secondary | ICD-10-CM | POA: Diagnosis not present

## 2023-07-15 DIAGNOSIS — K5720 Diverticulitis of large intestine with perforation and abscess without bleeding: Secondary | ICD-10-CM | POA: Diagnosis not present

## 2023-07-15 LAB — CBC
HCT: 30 % — ABNORMAL LOW (ref 39.0–52.0)
Hemoglobin: 9.9 g/dL — ABNORMAL LOW (ref 13.0–17.0)
MCH: 30 pg (ref 26.0–34.0)
MCHC: 33 g/dL (ref 30.0–36.0)
MCV: 90.9 fL (ref 80.0–100.0)
Platelets: 229 10*3/uL (ref 150–400)
RBC: 3.3 MIL/uL — ABNORMAL LOW (ref 4.22–5.81)
RDW: 12 % (ref 11.5–15.5)
WBC: 11.9 10*3/uL — ABNORMAL HIGH (ref 4.0–10.5)
nRBC: 0 % (ref 0.0–0.2)

## 2023-07-15 LAB — COMPREHENSIVE METABOLIC PANEL WITH GFR
ALT: 15 U/L (ref 0–44)
AST: 18 U/L (ref 15–41)
Albumin: 2.1 g/dL — ABNORMAL LOW (ref 3.5–5.0)
Alkaline Phosphatase: 103 U/L (ref 38–126)
Anion gap: 8 (ref 5–15)
BUN: 17 mg/dL (ref 8–23)
CO2: 27 mmol/L (ref 22–32)
Calcium: 8.6 mg/dL — ABNORMAL LOW (ref 8.9–10.3)
Chloride: 99 mmol/L (ref 98–111)
Creatinine, Ser: 0.81 mg/dL (ref 0.61–1.24)
GFR, Estimated: >60 mL/min (ref >60)
Glucose, Bld: 184 mg/dL — ABNORMAL HIGH (ref 70–99)
Potassium: 3.5 mmol/L (ref 3.5–5.1)
Sodium: 134 mmol/L — ABNORMAL LOW (ref 135–145)
Total Bilirubin: 1 mg/dL (ref 0.0–1.2)
Total Protein: 5.8 g/dL — ABNORMAL LOW (ref 6.5–8.1)

## 2023-07-15 LAB — CYTOLOGY - NON PAP

## 2023-07-15 LAB — GLUCOSE, CAPILLARY
Glucose-Capillary: 170 mg/dL — ABNORMAL HIGH (ref 70–99)
Glucose-Capillary: 151 mg/dL — ABNORMAL HIGH (ref 70–99)
Glucose-Capillary: 157 mg/dL — ABNORMAL HIGH (ref 70–99)
Glucose-Capillary: 129 mg/dL — ABNORMAL HIGH (ref 70–99)

## 2023-07-15 LAB — MAGNESIUM: Magnesium: 1.5 mg/dL — ABNORMAL LOW (ref 1.7–2.4)

## 2023-07-15 MED ORDER — POLYETHYLENE GLYCOL 3350 17 G PO PACK
17.0000 g | PACK | Freq: Every day | ORAL | Status: DC
  Administered 2023-07-15: 17 g via ORAL
  Filled 2023-07-15 (×3): qty 1

## 2023-07-15 MED ORDER — SODIUM CHLORIDE (PF) 0.9 % IJ SOLN
INTRAMUSCULAR | Status: AC
Start: 2023-07-15 — End: 2023-07-15
  Filled 2023-07-15: qty 50

## 2023-07-15 NOTE — Progress Notes (Signed)
 OT Cancellation Note  Patient Details Name: Patrick Kidd MRN: [id number] DOB: 1945/03/15   Cancelled Treatment:    Reason Eval/Treat Not Completed: Other (comment) Patient checked on earlier this AM with patient in session with PT. Patient approached later. Patient reported son was on his way up to visit and he was fatigued. Patients IV also noted to be leaking upon entry to room with nurse called into address this issue. OT to continue to follow and check back as schedule will allow.  Wynette Heckler, MS Acute Rehabilitation Department Office# [phone]  07/15/2023, 12:58 PM

## 2023-07-15 NOTE — Progress Notes (Signed)
 PT Cancellation Note  Patient Details Name: Patrick Kidd MRN: [id number] DOB: 1945/04/25   Cancelled Treatment:    Reason Eval/Treat Not Completed: Fatigue/lethargy limiting ability to participate (pt sleeping soundly, he stated he's very tired and requested PT check back later. Will follow.)   Daymon Evans PT 07/15/2023  Acute Rehabilitation Services  Office [phone]

## 2023-07-15 NOTE — Progress Notes (Signed)
 Physical Therapy Treatment Patient Details Name: Patrick Kidd MRN: [id number] DOB: 11-02-1945 Today's Date: 07/15/2023   History of Present Illness Pt is a 78 yo male presenting to the ED for management of abdominal hematoma, night sweats; fell on his back ~2 weeks ago with CT lumbar spine with moderate central canal stenosis multi-level without acute fx or traumatic sublux. PMH: acrophobia, alcoholic cirrhosis, anemia, BPH, CLL, DM, GERD, gout, peripheral neuropathy, hx of prostate cancer, back surgery,  Wolff-Parkinson-White syndrome    PT Comments  Pt tolerated increased ambulation distance of 17' with RW, no loss of balance, distance limited by R hip pain and generalized fatigue.     If plan is discharge home, recommend the following: A little help with walking and/or transfers;A little help with bathing/dressing/bathroom;Help with stairs or ramp for entrance;Supervision due to cognitive status;Assist for transportation   Can travel by private vehicle        Equipment Recommendations  None recommended by PT    Recommendations for Other Services OT consult     Precautions / Restrictions Precautions Precautions: Fall;Other (comment) Recall of Precautions/Restrictions: Intact Precaution/Restrictions Comments: R hip (pt states this is 2* positioning during surgery); abdominal binder Restrictions Weight Bearing Restrictions Per Provider Order: No     Mobility  Bed Mobility Overal bed mobility: Needs Assistance Bed Mobility: Rolling, Sidelying to Sit Rolling: Min assist Sidelying to sit: Min assist       General bed mobility comments: Verbal/manual cues for log rolling technique    Transfers Overall transfer level: Needs assistance Equipment used: Rolling walker (2 wheels) Transfers: Sit to/from Stand Sit to Stand: Contact guard assist           General transfer comment: VCs hand placement, required repeated VC to respond to command for safe hand placement     Ambulation/Gait Ambulation/Gait assistance: Contact guard assist Gait Distance (Feet): 70 Feet Assistive device: Rolling walker (2 wheels) Gait Pattern/deviations: Step-to pattern, Trunk flexed Gait velocity: decreased     General Gait Details: VCs for proximity to RW, distance limited by pain/fatigue, no loss of balance   Stairs             Wheelchair Mobility     Tilt Bed    Modified Rankin (Stroke Patients Only)       Balance Overall balance assessment: Needs assistance, History of Falls Sitting-balance support: Feet supported, No upper extremity supported Sitting balance-Leahy Scale: Good     Standing balance support: During functional activity, Reliant on assistive device for balance, Bilateral upper extremity supported Standing balance-Leahy Scale: Fair                              Hotel manager: No apparent difficulties  Cognition Arousal: Alert Behavior During Therapy: WFL for tasks assessed/performed   PT - Cognitive impairments: No apparent impairments                         Following commands: Intact Following commands impaired: Follows one step commands with increased time    Cueing Cueing Techniques: Verbal cues, Tactile cues  Exercises General Exercises - Lower Extremity Ankle Circles/Pumps: AROM, Both, 10 reps, Supine Quad Sets: AROM, Both, 5 reps, Supine    General Comments        Pertinent Vitals/Pain Pain Assessment Pain Score: 5  Pain Location: R hip Pain Descriptors / Indicators: Sore, Aching Pain Intervention(s): Limited activity within patient's tolerance,  Monitored during session, Patient requesting pain meds-RN notified, Repositioned    Home Living                          Prior Function            PT Goals (current goals can now be found in the care plan section) Acute Rehab PT Goals Patient Stated Goal: get stronger, be able to walk in the cul de  sac PT Goal Formulation: With family Potential to Achieve Goals: Good Progress towards PT goals: Progressing toward goals    Frequency    Min 3X/week      PT Plan      Co-evaluation              AM-PAC PT 6 Clicks Mobility   Outcome Measure  Help needed turning from your back to your side while in a flat bed without using bedrails?: A Little Help needed moving from lying on your back to sitting on the side of a flat bed without using bedrails?: A Little Help needed moving to and from a bed to a chair (including a wheelchair)?: A Little Help needed standing up from a chair using your arms (e.g., wheelchair or bedside chair)?: A Little Help needed to walk in hospital room?: A Little Help needed climbing 3-5 steps with a railing? : A Lot 6 Click Score: 17    End of Session Equipment Utilized During Treatment: Gait belt Activity Tolerance: Patient limited by fatigue;Patient limited by pain Patient left: in chair;with call bell/phone within reach;with nursing/sitter in room Nurse Communication: Mobility status;Patient requests pain meds PT Visit Diagnosis: Unsteadiness on feet (R26.81);History of falling (Z91.81);Difficulty in walking, not elsewhere classified (R26.2)     Time: 7829-5621 PT Time Calculation (min) (ACUTE ONLY): 18 min  Charges:    $Gait Training: 8-22 mins PT General Charges $$ ACUTE PT VISIT: 1 Visit                     Daymon Evans PT 07/15/2023  Acute Rehabilitation Services  Office [phone]

## 2023-07-15 NOTE — Progress Notes (Addendum)
 PROGRESS NOTE  Patrick Kidd:[id number] DOB: 1946-01-12 DOA: 07/09/2023 PCP: [name], MD   LOS: 5 days   Brief narrative:  Patient is a 78 years old male with CLL, alcoholic and NASH cirrhosis with hepatic encephalopathy, thrombocytopenia, portal HTN,  H/o SBP in 2020, DM, peripheral neuropathy, prostate cancer, BPH, chronic back pain and osteoarthritis presented to the hospital abnormal CT of the abd/pelvis.  He reported fever 3 to 4 weeks ago with increasing weakness and decreased appetite and a fall 3 weeks back.  He had seen GI on 07/09/2023 due to abdominal pain and lack of appetite.  CT scan of the abdomen and pelvis done showed 9.5 x 12.7 collection in right lower abdomen favoring probable evolving hematoma with splenomegaly.  Patient was then admitted to the hospital for further evaluation and treatment. During hospitalization, fevers did not resolve despite IV antibiotics and blood cultures were negative.  IR drain placed on 6/9.  Hospital course has been complicated by confusion and agitation exacerbated by the narcotics he is required for his pain.     Assessment/Plan: Principal Problem:   SIRS (systemic inflammatory response syndrome) (HCC) Active Problems:   Type 2 diabetes mellitus with complication, with long-term current use of insulin  (HCC)   Essential hypertension   WPW (Wolff-Parkinson-White syndrome)   Chronic lymphocytic leukemia (HCC)   BPH associated with nocturia   Gout, unspecified   OSA (obstructive sleep apnea)   Portal hypertension (HCC)   Thrombocytopenia (HCC)   Hematoma  Sepsis secondary to retroperitoneal hematoma infection. Patient was initially on vancomycin  and cefepime .  Blood cultures negative so far in 5 days.. Currently on Zosyn . Status post IR intervention and drainage of 60 cc of purulent fluid.  Continue retroperitoneal drain.  IR managing.  Fevers improving..  Fluid culture with Klebsiella pneumonia.  Continue Zosyn .  Plan for repeat  CT scan of the abdomen pelvis.   Acute toxic encephalopathy  Likely secondary to narcotics and infection.  Continue Tylenol  and tramadol  for pain management.  Trying to limit pain medications.  Patient is alert awake oriented at this time.   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 Elevated INR Intermittent thrombocytopenia Platelets have improved.  No evidence of bleeding.     Type 2 diabetes mellitus with complication, with long-term current use of insulin , uncontrolled Patient is on metformin , Toujeo  12 units daily but according to his wife, he does not always adhere to a diabetic diet.  Continue with Semglee  and NovoLog .  Latest POC glucose of 170.  Continue diabetic diet    Chronic lymphocytic leukemia  Last evaluated in October 2024-did not need treatment at that time  Debility deconditioning.  Will get PT OT evaluation.  DVT prophylaxis: Place and maintain sequential compression device Start: 07/09/23 2027   Disposition: Likely home with home health in 1 to 2 days  Status is: Inpatient Remains inpatient appropriate because: IV antibiotic, abdominal drain, pending clinical improvement    Code Status:     Code Status: Full Code  Family Communication: None at bedside  Consultants: Interventional radiology   Procedures: CT-guided retroperitoneal abscess drainage catheter placement on 07/13/2023 by interventional radiology  Anti-infectives:  Zosyn   Anti-infectives (From admission, onward)    Start     Dose/Rate Route Frequency Ordered Stop   07/13/23 1230  piperacillin -tazobactam (ZOSYN ) IVPB 3.375 g        3.375 g 12.5 mL/hr over 240 Minutes Intravenous Every 8 hours 07/13/23 0814     07/10/23 1700  vancomycin  (VANCOREADY) IVPB 2000 mg/400 mL  Status:  Discontinued        2,000 mg 200 mL/hr over 120 Minutes Intravenous Every 24 hours 07/10/23 0017 07/13/23 0810   07/10/23 0400  ceFEPIme  (MAXIPIME ) 2 g in sodium chloride  0.9 % 100 mL IVPB  Status:   Discontinued        2 g 200 mL/hr over 30 Minutes Intravenous Every 8 hours 07/10/23 0017 07/13/23 0809   07/09/23 1845  vancomycin  (VANCOREADY) IVPB 2000 mg/400 mL        2,000 mg 200 mL/hr over 120 Minutes Intravenous  Once 07/09/23 1836 07/09/23 2152   07/09/23 1845  ceFEPIme  (MAXIPIME ) 2 g in sodium chloride  0.9 % 100 mL IVPB        2 g 200 mL/hr over 30 Minutes Intravenous  Once 07/09/23 1836 07/09/23 2024        Subjective: Today, patient was seen and examined at bedside.  Patient is alert awake oriented.  Denies overt pain.  No nausea vomiting fever chills or rigor.  Has not had a bowel movement.  Does not feel hungry as well  Objective: Vitals:   07/14/23 2048 07/15/23 0518  BP: (!) 160/80 (!) 154/70  Pulse: (!) 108 (!) 102  Resp: 16 16  Temp: 99.1 F (37.3 C) 98.4 F (36.9 C)  SpO2: 97% 97%    Intake/Output Summary (Last 24 hours) at 07/15/2023 1105 Last data filed at 07/15/2023 0520 Gross per 24 hour  Intake --  Output 150 ml  Net -150 ml   Filed Weights   07/09/23 1810  Weight: 99.3 kg   Body mass index is 28.11 kg/m.   Physical Exam: GENERAL: Patient is alert awake and oriented. Not in obvious distress. HENT: No scleral pallor or icterus. Pupils equally reactive to light. Oral mucosa is moist NECK: is supple, no gross swelling noted. CHEST: Clear to auscultation. No crackles or wheezes.  Diminished breath sounds bilaterally. CVS: S1 and S2 heard, no murmur. Regular rate and rhythm.  ABDOMEN: Soft, non-tender, bowel sounds are present.  Abdominal binder in place with abdominal drain. EXTREMITIES: No edema. CNS: Cranial nerves are intact. No focal motor deficits. SKIN: warm and dry without rashes.  Data Review: I have personally reviewed the following laboratory data and studies,  CBC: Recent Labs  Lab 07/09/23 0954 07/09/23 1425 07/10/23 0509 07/10/23 1608 07/11/23 0738 07/12/23 0644 07/13/23 0546 07/14/23 0551  WBC 15.3*   < > 13.8*  --   12.5* 12.2* 12.5* 11.4*  NEUTROABS 11.6*  --   --   --   --   --   --   --   HGB 11.3*   < > 9.9* 9.4* 9.2* 9.1* 9.1* 9.7*  HCT 33.2*   < > 30.7* 28.6* 27.7* 27.4* 27.0* 28.6*  MCV 88.0   < > 94.8  --  91.7 91.9 90.3 89.4  PLT 226.0   < > 175  --  164 192 170 173   < > = values in this interval not displayed.   Basic Metabolic Panel: Recent Labs  Lab 07/10/23 0509 07/11/23 0738 07/12/23 0644 07/13/23 0546 07/13/23 1804  NA 134* 132* 132* 130* 133*  K 4.0 4.2 3.9 3.8 3.7  CL 102 101 101 99 100  CO2 24 24 21* 23 22  GLUCOSE 226* 229* 204* 201* 159*  BUN 17 19 19 19 18   CREATININE 1.13 1.19 0.97 0.96 0.93  CALCIUM  8.3* 8.1* 8.2* 8.3* 8.5*   Liver  Function Tests: Recent Labs  Lab 07/09/23 0954 07/09/23 1733 07/10/23 0509  AST 12 17 14*  ALT 10 13 12   ALKPHOS 109 103 94  BILITOT 1.4* 1.4* 1.1  PROT 6.5 6.3* 5.8*  ALBUMIN  3.3* 2.6* 2.3*   Recent Labs  Lab 07/09/23 1733  LIPASE 20   Recent Labs  Lab 07/09/23 0954 07/13/23 1417  AMMONIA 39* 19   Cardiac Enzymes: No results for input(s): CKTOTAL, CKMB, CKMBINDEX, TROPONINI in the last 168 hours. BNP (last 3 results) Recent Labs    12/08/22 0452  BNP 623.7*    ProBNP (last 3 results) No results for input(s): PROBNP in the last 8760 hours.  CBG: Recent Labs  Lab 07/14/23 0719 07/14/23 1124 07/14/23 1612 07/14/23 2056 07/15/23 0737  GLUCAP 137* 190* 138* 252* 170*   Recent Results (from the past 240 hours)  Blood culture (routine x 2)     Status: None   Collection Time: 07/09/23  6:25 PM   Specimen: BLOOD RIGHT FOREARM  Result Value Ref Range Status   Specimen Description   Final    BLOOD RIGHT FOREARM Performed at [facility], [address] [place], [place] [address]    Special Requests   Final    BOTTLES DRAWN AEROBIC AND ANAEROBIC Blood Culture results may not be optimal due to an inadequate volume of blood received in culture bottles Performed at [facility], [address] [place], [place] [address]    Culture   Final    NO GROWTH 5 DAYS Performed at [facility], [address] [place], [place] [address]    Report Status 07/14/2023 FINAL  Final  Blood culture (routine x 2)     Status: None   Collection Time: 07/09/23  6:26 PM   Specimen: BLOOD  Result Value Ref Range Status   Specimen Description   Final    BLOOD LEFT ANTECUBITAL Performed at [facility], [address], [place], [place] [address]    Special Requests   Final    BOTTLES DRAWN AEROBIC AND ANAEROBIC Blood Culture results may not be optimal due to an inadequate volume of blood received in culture bottles Performed at [facility], [address], [place], [place] [address]    Culture   Final    NO GROWTH 5 DAYS Performed at [facility], [address], [place], [place] [address]    Report Status 07/14/2023 FINAL  Final  Resp panel by RT-PCR (RSV, Flu A&B, Covid) Anterior Nasal Swab     Status: None   Collection Time: 07/10/23  8:42 AM   Specimen: Anterior Nasal Swab  Result Value Ref Range Status   SARS Coronavirus 2 by RT PCR NEGATIVE NEGATIVE Final    Comment: (NOTE) SARS-CoV-2 target nucleic acids are NOT DETECTED.  The SARS-CoV-2 RNA is generally detectable in upper respiratory specimens during the acute phase of infection. The lowest concentration of SARS-CoV-2 viral copies this assay can detect is 138 copies/mL. A negative result does not preclude SARS-Cov-2 infection and should not be used as the sole basis for treatment or other patient management decisions. A negative result may occur with  improper specimen collection/handling, submission of specimen other than nasopharyngeal swab, presence of viral mutation(s) within the areas targeted by this assay, and inadequate number of viral copies(<138 copies/mL). A negative result must be combined with clinical observations, patient history, and  epidemiological information. The expected result is Negative.  Fact Sheet for Patients:  BloggerCourse.com  Fact Sheet for Healthcare Providers:  SeriousBroker.it  This test is no t yet approved or cleared by the [place]  FDA and  has been authorized for detection and/or diagnosis of SARS-CoV-2 by FDA under an Emergency Use Authorization (EUA). This EUA will remain  in effect (meaning this test can be used) for the duration of the COVID-19 declaration under Section 564(b)(1) of the Act, 21 U.S.C.section 360bbb-3(b)(1), unless the authorization is terminated  or revoked sooner.       Influenza A by PCR NEGATIVE NEGATIVE Final   Influenza B by PCR NEGATIVE NEGATIVE Final    Comment: (NOTE) The Xpert Xpress SARS-CoV-2/FLU/RSV plus assay is intended as an aid in the diagnosis of influenza from Nasopharyngeal swab specimens and should not be used as a sole basis for treatment. Nasal washings and aspirates are unacceptable for Xpert Xpress SARS-CoV-2/FLU/RSV testing.  Fact Sheet for Patients: BloggerCourse.com  Fact Sheet for Healthcare Providers: SeriousBroker.it  This test is not yet approved or cleared by the [place]  FDA and has been authorized for detection and/or diagnosis of SARS-CoV-2 by FDA under an Emergency Use Authorization (EUA). This EUA will remain in effect (meaning this test can be used) for the duration of the COVID-19 declaration under Section 564(b)(1) of the Act, 21 U.S.C. section 360bbb-3(b)(1), unless the authorization is terminated or revoked.     Resp Syncytial Virus by PCR NEGATIVE NEGATIVE Final    Comment: (NOTE) Fact Sheet for Patients: BloggerCourse.com  Fact Sheet for Healthcare Providers: SeriousBroker.it  This test is not yet approved or cleared by the [place]  FDA and has been  authorized for detection and/or diagnosis of SARS-CoV-2 by FDA under an Emergency Use Authorization (EUA). This EUA will remain in effect (meaning this test can be used) for the duration of the COVID-19 declaration under Section 564(b)(1) of the Act, 21 U.S.C. section 360bbb-3(b)(1), unless the authorization is terminated or revoked.  Performed at [facility], [address], [place], [place] [address]   Respiratory (~20 pathogens) panel by PCR     Status: None   Collection Time: 07/10/23  8:42 AM   Specimen: Nasopharyngeal Swab; Respiratory  Result Value Ref Range Status   Adenovirus NOT DETECTED NOT DETECTED Final   Coronavirus 229E NOT DETECTED NOT DETECTED Final    Comment: (NOTE) The Coronavirus on the Respiratory Panel, DOES NOT test for the novel  Coronavirus (2019 nCoV)    Coronavirus HKU1 NOT DETECTED NOT DETECTED Final   Coronavirus NL63 NOT DETECTED NOT DETECTED Final   Coronavirus OC43 NOT DETECTED NOT DETECTED Final   Metapneumovirus NOT DETECTED NOT DETECTED Final   Rhinovirus / Enterovirus NOT DETECTED NOT DETECTED Final   Influenza A NOT DETECTED NOT DETECTED Final   Influenza B NOT DETECTED NOT DETECTED Final   Parainfluenza Virus 1 NOT DETECTED NOT DETECTED Final   Parainfluenza Virus 2 NOT DETECTED NOT DETECTED Final   Parainfluenza Virus 3 NOT DETECTED NOT DETECTED Final   Parainfluenza Virus 4 NOT DETECTED NOT DETECTED Final   Respiratory Syncytial Virus NOT DETECTED NOT DETECTED Final   Bordetella pertussis NOT DETECTED NOT DETECTED Final   Bordetella Parapertussis NOT DETECTED NOT DETECTED Final   Chlamydophila pneumoniae NOT DETECTED NOT DETECTED Final   Mycoplasma pneumoniae NOT DETECTED NOT DETECTED Final    Comment: Performed at [facility], [address], [place], [place] [address]  Aerobic/Anaerobic Culture w Gram Stain (surgical/deep wound)     Status:  None (Preliminary result)   Collection Time: 07/13/23  4:17 PM    Specimen: Abscess  Result Value Ref Range Status   Specimen Description   Final    ABSCESS Performed at [facility], [address] [place], [place] [address]    Special Requests   Final    NONE Performed at [facility], [address] [place], [place] [address]    Gram Stain   Final    ABUNDANT WBC PRESENT, PREDOMINANTLY PMN RARE GRAM NEGATIVE RODS Performed at [facility], [address], [place], [place] [address]    Culture MODERATE KLEBSIELLA PNEUMONIAE  Final   Report Status PENDING  Incomplete   Organism ID, Bacteria KLEBSIELLA PNEUMONIAE  Final      Susceptibility   Klebsiella pneumoniae - MIC*    AMPICILLIN  >=32 RESISTANT Resistant     CEFEPIME  <=0.12 SENSITIVE Sensitive     CEFTAZIDIME <=1 SENSITIVE Sensitive     CEFTRIAXONE  <=0.25 SENSITIVE Sensitive     CIPROFLOXACIN  <=0.25 SENSITIVE Sensitive     GENTAMICIN <=1 SENSITIVE Sensitive     IMIPENEM <=0.25 SENSITIVE Sensitive     TRIMETH /SULFA  <=20 SENSITIVE Sensitive     AMPICILLIN /SULBACTAM 8 SENSITIVE Sensitive     PIP/TAZO <=4 SENSITIVE Sensitive ug/mL    * MODERATE KLEBSIELLA PNEUMONIAE     Studies: CT GUIDED PERITONEAL/RETROPERITONEAL FLUID DRAIN BY PERC CATH Result Date: 07/13/2023 INDICATION: [id number] Abscess [id number] EXAM: CT-GUIDED RIGHT FLANK/RETROPERITONEAL ABSCESS DRAINAGE CATHETER PLACEMENT COMPARISON:  CT AP and MRI abdomen pelvis, 07/11/2023 MEDICATIONS: The patient is currently admitted to the hospital and receiving intravenous antibiotics. The antibiotics were administered within an appropriate time frame prior to the initiation of the procedure. ANESTHESIA/SEDATION: Moderate (conscious) sedation was employed during this procedure. A total of Versed  2 mg and Fentanyl  100 mcg was administered intravenously. Moderate Sedation Time: 23 minutes. The patient's level of consciousness and vital signs were monitored continuously by radiology nursing throughout the procedure  under my direct supervision. CONTRAST:  None FLUOROSCOPY: CT dose; 1274 mGycm COMPLICATIONS: None immediate. PROCEDURE: RADIATION DOSE REDUCTION: This exam was performed according to the departmental dose-optimization program which includes automated exposure control, adjustment of the mA and/or kV according to patient size and/or use of iterative reconstruction technique. Informed written consent was obtained from the patient after a discussion of the risks, benefits and alternatives to treatment. The patient was placed supine on the CT gantry and a pre procedural CT was performed re-demonstrating the known abscess/fluid collection within the the RIGHT flank. The procedure was planned. A timeout was performed prior to the initiation of the procedure. The RIGHT 5 was prepped and draped in the usual sterile fashion. The overlying soft tissues were anesthetized with 1% lidocaine  with epinephrine . Appropriate trajectory was planned with the use of a 22 gauge spinal needle. An 18 gauge trocar needle was advanced into the abscess/fluid collection and a short Amplatz super stiff wire was coiled within the collection. Appropriate positioning was confirmed with a limited CT scan. The tract was serially dilated allowing placement of a 10 Fr drainage catheter. Appropriate positioning was confirmed with a limited postprocedural CT scan. 60 mL of purulent fluid was aspirated. The tube was connected to a bulb suction and sutured in place. A dressing was placed. The patient tolerated the procedure well without immediate post procedural complication. IMPRESSION: Successful CT-guided placement of a 10 Fr drainage catheter into the RIGHT retroperitoneal abscess with aspiration of 60 mL of purulent fluid. Samples were sent  to the laboratory as requested by the ordering clinical team. RECOMMENDATIONS: The patient will return to V[facility]iology (VIR) for routine drainage catheter evaluation and exchange in 10-14 days.  Art Largo, MD [facility] Electronically Signed   By: Art Largo M.D.   On: 07/13/2023 17:28      Rosena Conradi, MD  [facility] 07/15/2023  If 7PM-7AM, please contact night-coverage

## 2023-07-16 ENCOUNTER — Inpatient Hospital Stay (HOSPITAL_COMMUNITY)

## 2023-07-16 DIAGNOSIS — R6510 Systemic inflammatory response syndrome (SIRS) of non-infectious origin without acute organ dysfunction: Secondary | ICD-10-CM | POA: Diagnosis not present

## 2023-07-16 DIAGNOSIS — K6819 Other retroperitoneal abscess: Secondary | ICD-10-CM | POA: Diagnosis not present

## 2023-07-16 LAB — GLUCOSE, CAPILLARY
Glucose-Capillary: 121 mg/dL — ABNORMAL HIGH (ref 70–99)
Glucose-Capillary: 144 mg/dL — ABNORMAL HIGH (ref 70–99)
Glucose-Capillary: 116 mg/dL — ABNORMAL HIGH (ref 70–99)
Glucose-Capillary: 141 mg/dL — ABNORMAL HIGH (ref 70–99)

## 2023-07-16 LAB — BASIC METABOLIC PANEL WITH GFR
Anion gap: 11 (ref 5–15)
BUN: 16 mg/dL (ref 8–23)
CO2: 24 mmol/L (ref 22–32)
Calcium: 8.6 mg/dL — ABNORMAL LOW (ref 8.9–10.3)
Chloride: 99 mmol/L (ref 98–111)
Creatinine, Ser: 0.85 mg/dL (ref 0.61–1.24)
GFR, Estimated: >60 mL/min (ref >60)
Glucose, Bld: 122 mg/dL — ABNORMAL HIGH (ref 70–99)
Potassium: 3.4 mmol/L — ABNORMAL LOW (ref 3.5–5.1)
Sodium: 134 mmol/L — ABNORMAL LOW (ref 135–145)

## 2023-07-16 LAB — AEROBIC/ANAEROBIC CULTURE W GRAM STAIN (SURGICAL/DEEP WOUND)

## 2023-07-16 LAB — CBC
HCT: 30 % — ABNORMAL LOW (ref 39.0–52.0)
Hemoglobin: 9.5 g/dL — ABNORMAL LOW (ref 13.0–17.0)
MCH: 29.3 pg (ref 26.0–34.0)
MCHC: 31.7 g/dL (ref 30.0–36.0)
MCV: 92.6 fL (ref 80.0–100.0)
Platelets: 253 10*3/uL (ref 150–400)
RBC: 3.24 MIL/uL — ABNORMAL LOW (ref 4.22–5.81)
RDW: 12.2 % (ref 11.5–15.5)
WBC: 14.7 10*3/uL — ABNORMAL HIGH (ref 4.0–10.5)
nRBC: 0 % (ref 0.0–0.2)

## 2023-07-16 LAB — MAGNESIUM: Magnesium: 2.1 mg/dL (ref 1.7–2.4)

## 2023-07-16 MED ORDER — FENTANYL CITRATE (PF) 100 MCG/2ML IJ SOLN
INTRAMUSCULAR | Status: AC | PRN
  Administered 2023-07-16 (×2): 50 ug via INTRAVENOUS

## 2023-07-16 MED ORDER — FENTANYL CITRATE (PF) 100 MCG/2ML IJ SOLN
INTRAMUSCULAR | Status: AC
Start: 2023-07-16 — End: 2023-07-16
  Filled 2023-07-16: qty 2

## 2023-07-16 MED ORDER — MAGNESIUM SULFATE 2 GM/50ML IV SOLN
2.0000 g | Freq: Once | INTRAVENOUS | Status: AC
  Administered 2023-07-16: 2 g via INTRAVENOUS
  Filled 2023-07-16: qty 50

## 2023-07-16 MED ORDER — POTASSIUM CHLORIDE CRYS ER 20 MEQ PO TBCR
40.0000 meq | EXTENDED_RELEASE_TABLET | Freq: Once | ORAL | Status: AC
  Administered 2023-07-16: 40 meq via ORAL
  Filled 2023-07-16: qty 2

## 2023-07-16 NOTE — Sedation Documentation (Signed)
 Pt ate breakfast procedure unsedated per MD fentanyl  only.

## 2023-07-16 NOTE — Progress Notes (Signed)
 PROGRESS NOTE  Patrick Kidd ZOX:[id number] DOB: 01-26-1946 DOA: 07/09/2023 PCP: [name], MD   LOS: 6 days   Brief narrative:  Patient is a 78 years old male with CLL, alcoholic and NASH cirrhosis with hepatic encephalopathy, thrombocytopenia, portal HTN,  H/o SBP in 2020, DM, peripheral neuropathy, prostate cancer, BPH, chronic back pain and osteoarthritis presented to the hospital abnormal CT of the abd/pelvis.  He reported fever 3 to 4 weeks ago with increasing weakness and decreased appetite and a fall 3 weeks back.  He had seen GI on 07/09/2023 due to abdominal pain and lack of appetite.  CT scan of the abdomen and pelvis done showed 9.5 x 12.7 collection in right lower abdomen favoring probable evolving hematoma with splenomegaly.  Patient was then admitted to the hospital for further evaluation and treatment. During hospitalization, fevers did not resolve despite IV antibiotics and blood cultures were negative.  IR drain placed on 6/9.  Hospital course has been complicated by confusion and agitation exacerbated by the narcotics he is required for his pain.     Assessment/Plan: Principal Problem:   SIRS (systemic inflammatory response syndrome) (HCC) Active Problems:   Type 2 diabetes mellitus with complication, with long-term current use of insulin  (HCC)   Essential hypertension   WPW (Wolff-Parkinson-White syndrome)   Chronic lymphocytic leukemia (HCC)   BPH associated with nocturia   Gout, unspecified   OSA (obstructive sleep apnea)   Portal hypertension (HCC)   Thrombocytopenia (HCC)   Hematoma  Sepsis secondary to retroperitoneal hematoma infection. Patient was initially on vancomycin  and cefepime .  Blood cultures negative so far in 5 days.. Currently on Zosyn . Status post IR intervention and drainage of 60 cc of purulent fluid.  Continue retroperitoneal drain.  Drain has been displaced at this time.  Has been removed and plan for repeat placement by IR.  Fluid culture  with Klebsiella pneumonia.  Continue Zosyn .    Acute toxic encephalopathy  Likely secondary to narcotics and infection.  Continue Tylenol  and tramadol  for pain management.  Trying to limit pain medications.  Patient is alert awake oriented at this time.  Nursing staff reported that he pulled out the drain tube.  He denies any   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 Elevated INR Intermittent thrombocytopenia Platelets have improved.  No evidence of bleeding.     Type 2 diabetes mellitus with complication, with long-term current use of insulin , uncontrolled Patient is on metformin , Toujeo  12 units daily but according to his wife, he does not always adhere to a diabetic diet.  Continue with Semglee  and NovoLog .  Latest POC glucose of 144.  Continue diabetic diet    Chronic lymphocytic leukemia  Last evaluated in October 2024-did not need treatment at that time  Hypokalemia.  Will replace potassium.  Check levels in a.m.  Hypomagnesemia.  Will replenish through IV.  Debility deconditioning.  Seen by PT OT and recommend home health.  Discharged  DVT prophylaxis: Place and maintain sequential compression device Start: 07/09/23 2027   Disposition: Likely home with home health in 1 to 2 days  Status is: Inpatient Remains inpatient appropriate because: IV antibiotic, abdominal drain, pending clinical improvement, need for replacing the drain.    Code Status:     Code Status: Full Code  Family Communication: Spoke with the patient's wife at bedside and updated [facility] for the clinical condition of the patient..  Consultants: Interventional radiology   Procedures: CT-guided retroperitoneal abscess drainage catheter placement on 07/13/2023 by  interventional radiology  Anti-infectives:  Zosyn   Anti-infectives (From admission, onward)    Start     Dose/Rate Route Frequency Ordered Stop   07/13/23 1230  piperacillin -tazobactam (ZOSYN ) IVPB 3.375 g        3.375 g 12.5 mL/hr  over 240 Minutes Intravenous Every 8 hours 07/13/23 0814     07/10/23 1700  vancomycin  (VANCOREADY) IVPB 2000 mg/400 mL  Status:  Discontinued        2,000 mg 200 mL/hr over 120 Minutes Intravenous Every 24 hours 07/10/23 0017 07/13/23 0810   07/10/23 0400  ceFEPIme  (MAXIPIME ) 2 g in sodium chloride  0.9 % 100 mL IVPB  Status:  Discontinued        2 g 200 mL/hr over 30 Minutes Intravenous Every 8 hours 07/10/23 0017 07/13/23 0809   07/09/23 1845  vancomycin  (VANCOREADY) IVPB 2000 mg/400 mL        2,000 mg 200 mL/hr over 120 Minutes Intravenous  Once 07/09/23 1836 07/09/23 2152   07/09/23 1845  ceFEPIme  (MAXIPIME ) 2 g in sodium chloride  0.9 % 100 mL IVPB        2 g 200 mL/hr over 30 Minutes Intravenous  Once 07/09/23 1836 07/09/23 2024        Subjective: Today, patient was seen and examined at bedside.  Patient denies any nausea vomiting abdominal pain fever chills or rigor.   Objective: Vitals:   07/15/23 2133 07/16/23 0432  BP: (!) 155/70 (!) 148/67  Pulse: 99 (!) 101  Resp: 16 14  Temp: 99 F (37.2 C) 98.9 F (37.2 C)  SpO2: 96% 95%    Intake/Output Summary (Last 24 hours) at 07/16/2023 1300 Last data filed at 07/16/2023 0900 Gross per 24 hour  Intake 430.74 ml  Output 400 ml  Net 30.74 ml   Filed Weights   07/09/23 1810  Weight: 99.3 kg   Body mass index is 28.11 kg/m.   Physical Exam:  GENERAL: Patient is alert awake and oriented. Not in obvious distress. HENT: No scleral pallor or icterus. Pupils equally reactive to light. Oral mucosa is moist NECK: is supple, no gross swelling noted. CHEST: Clear to auscultation.  Diminished breath sounds bilaterally. CVS: S1 and S2 heard, no murmur. Regular rate and rhythm.  ABDOMEN: Soft, non-tender, bowel sounds are present.  Abdominal binder in place  EXTREMITIES: No edema. CNS: Cranial nerves are intact. No focal motor deficits. SKIN: warm and dry without rashes.  Data Review: I have personally reviewed the following  laboratory data and studies,  CBC: Recent Labs  Lab 07/12/23 0644 07/13/23 0546 07/14/23 0551 07/15/23 1156 07/16/23 0618  WBC 12.2* 12.5* 11.4* 11.9* 14.7*  HGB 9.1* 9.1* 9.7* 9.9* 9.5*  HCT 27.4* 27.0* 28.6* 30.0* 30.0*  MCV 91.9 90.3 89.4 90.9 92.6  PLT 192 170 173 229 253   Basic Metabolic Panel: Recent Labs  Lab 07/12/23 0644 07/13/23 0546 07/13/23 1804 07/15/23 1156 07/16/23 0618  NA 132* 130* 133* 134* 134*  K 3.9 3.8 3.7 3.5 3.4*  CL 101 99 100 99 99  CO2 21* 23 22 27 24   GLUCOSE 204* 201* 159* 184* 122*  BUN 19 19 18 17 16   CREATININE 0.97 0.96 0.93 0.81 0.85  CALCIUM  8.2* 8.3* 8.5* 8.6* 8.6*  MG  --   --   --  1.5* 2.1   Liver Function Tests: Recent Labs  Lab 07/09/23 1733 07/10/23 0509 07/15/23 1156  AST 17 14* 18  ALT 13 12 15   ALKPHOS 103 94 103  BILITOT 1.4* 1.1 1.0  PROT 6.3* 5.8* 5.8*  ALBUMIN  2.6* 2.3* 2.1*   Recent Labs  Lab 07/09/23 1733  LIPASE 20   Recent Labs  Lab 07/13/23 1417  AMMONIA 19   Cardiac Enzymes: No results for input(s): CKTOTAL, CKMB, CKMBINDEX, TROPONINI in the last 168 hours. BNP (last 3 results) Recent Labs    12/08/22 0452  BNP 623.7*    ProBNP (last 3 results) No results for input(s): PROBNP in the last 8760 hours.  CBG: Recent Labs  Lab 07/15/23 1116 07/15/23 1628 07/15/23 2129 07/16/23 0718 07/16/23 1104  GLUCAP 151* 157* 129* 121* 144*   Recent Results (from the past 240 hours)  Blood culture (routine x 2)     Status: None   Collection Time: 07/09/23  6:25 PM   Specimen: BLOOD RIGHT FOREARM  Result Value Ref Range Status   Specimen Description   Final    BLOOD RIGHT FOREARM Performed at [facility], [address], [place], [place] [address]    Special Requests   Final    BOTTLES DRAWN AEROBIC AND ANAEROBIC Blood Culture results may not be optimal due to an inadequate volume of blood received in culture bottles Performed at [facility], [address], [place], [place] [address]    Culture   Final    NO GROWTH 5 DAYS Performed at [facility], [address] [place], [place] [address]    Report Status 07/14/2023 FINAL  Final  Blood culture (routine x 2)     Status: None   Collection Time: 07/09/23  6:26 PM   Specimen: BLOOD  Result Value Ref Range Status   Specimen Description   Final    BLOOD LEFT ANTECUBITAL Performed at [facility], [address], [place], [place] [address]    Special Requests   Final    BOTTLES DRAWN AEROBIC AND ANAEROBIC Blood Culture results may not be optimal due to an inadequate volume of blood received in culture bottles Performed at [facility], [address], [place], [place] [address]    Culture   Final    NO GROWTH 5 DAYS Performed at [facility], [address], [place], [place] [address]    Report Status 07/14/2023 FINAL  Final  Resp panel by RT-PCR (RSV, Flu A&B, Covid) Anterior Nasal Swab     Status: None   Collection Time: 07/10/23  8:42 AM   Specimen: Anterior Nasal Swab  Result Value Ref Range Status   SARS Coronavirus 2 by RT PCR NEGATIVE NEGATIVE Final    Comment: (NOTE) SARS-CoV-2 target nucleic acids are NOT DETECTED.  The SARS-CoV-2 RNA is generally detectable in upper respiratory specimens during the acute phase of infection. The lowest concentration of SARS-CoV-2 viral copies this assay can detect is 138 copies/mL. A negative result does not preclude SARS-Cov-2 infection and should not be used as the sole basis for treatment or other patient management decisions. A negative result may occur with  improper specimen collection/handling, submission of specimen other than nasopharyngeal swab, presence of viral mutation(s) within the areas targeted by this assay, and inadequate number of viral copies(<138 copies/mL). A negative result must be combined with clinical observations, patient history, and  epidemiological information. The expected result is Negative.  Fact Sheet for Patients:  BloggerCourse.com  Fact Sheet for Healthcare Providers:  SeriousBroker.it  This test is no t yet approved or cleared by the [place]  FDA and  has been authorized for detection and/or diagnosis of SARS-CoV-2 by FDA under an Emergency Use Authorization (EUA). This EUA will remain  in effect (meaning this test can be used) for the duration of the COVID-19 declaration under Section 564(b)(1) of the Act, 21 U.S.C.section 360bbb-3(b)(1), unless the authorization is terminated  or revoked sooner.       Influenza A by PCR NEGATIVE NEGATIVE Final   Influenza B by PCR NEGATIVE NEGATIVE Final    Comment: (NOTE) The Xpert Xpress SARS-CoV-2/FLU/RSV plus assay is intended as an aid in the diagnosis of influenza from Nasopharyngeal swab specimens and should not be used as a sole basis for treatment. Nasal washings and aspirates are unacceptable for Xpert Xpress SARS-CoV-2/FLU/RSV testing.  Fact Sheet for Patients: BloggerCourse.com  Fact Sheet for Healthcare Providers: SeriousBroker.it  This test is not yet approved or cleared by the [place]  FDA and has been authorized for detection and/or diagnosis of SARS-CoV-2 by FDA under an Emergency Use Authorization (EUA). This EUA will remain in effect (meaning this test can be used) for the duration of the COVID-19 declaration under Section 564(b)(1) of the Act, 21 U.S.C. section 360bbb-3(b)(1), unless the authorization is terminated or revoked.     Resp Syncytial Virus by PCR NEGATIVE NEGATIVE Final    Comment: (NOTE) Fact Sheet for Patients: BloggerCourse.com  Fact Sheet for Healthcare Providers: SeriousBroker.it  This test is not yet approved or cleared by the [place]  FDA and has been  authorized for detection and/or diagnosis of SARS-CoV-2 by FDA under an Emergency Use Authorization (EUA). This EUA will remain in effect (meaning this test can be used) for the duration of the COVID-19 declaration under Section 564(b)(1) of the Act, 21 U.S.C. section 360bbb-3(b)(1), unless the authorization is terminated or revoked.  Performed at [facility], [address] [place], [place] [address]   Respiratory (~20 pathogens) panel by PCR     Status: None   Collection Time: 07/10/23  8:42 AM   Specimen: Nasopharyngeal Swab; Respiratory  Result Value Ref Range Status   Adenovirus NOT DETECTED NOT DETECTED Final   Coronavirus 229E NOT DETECTED NOT DETECTED Final    Comment: (NOTE) The Coronavirus on the Respiratory Panel, DOES NOT test for the novel  Coronavirus (2019 nCoV)    Coronavirus HKU1 NOT DETECTED NOT DETECTED Final   Coronavirus NL63 NOT DETECTED NOT DETECTED Final   Coronavirus OC43 NOT DETECTED NOT DETECTED Final   Metapneumovirus NOT DETECTED NOT DETECTED Final   Rhinovirus / Enterovirus NOT DETECTED NOT DETECTED Final   Influenza A NOT DETECTED NOT DETECTED Final   Influenza B NOT DETECTED NOT DETECTED Final   Parainfluenza Virus 1 NOT DETECTED NOT DETECTED Final   Parainfluenza Virus 2 NOT DETECTED NOT DETECTED Final   Parainfluenza Virus 3 NOT DETECTED NOT DETECTED Final   Parainfluenza Virus 4 NOT DETECTED NOT DETECTED Final   Respiratory Syncytial Virus NOT DETECTED NOT DETECTED Final   Bordetella pertussis NOT DETECTED NOT DETECTED Final   Bordetella Parapertussis NOT DETECTED NOT DETECTED Final   Chlamydophila pneumoniae NOT DETECTED NOT DETECTED Final   Mycoplasma pneumoniae NOT DETECTED NOT DETECTED Final    Comment: Performed at [facility], [address], [place], [place] [address]  Aerobic/Anaerobic Culture w Gram Stain (surgical/deep wound)     Status: None (Preliminary result)   Collection Time: 07/13/23  4:17 PM    Specimen: Abscess  Result Value Ref Range Status   Specimen Description   Final  ABSCESS Performed at [facility], [address], [place], [place] [address]    Special Requests   Final    NONE Performed at [facility], [address] [place], [place] [address]    Gram Stain   Final    ABUNDANT WBC PRESENT, PREDOMINANTLY PMN RARE GRAM NEGATIVE RODS    Culture   Final    MODERATE KLEBSIELLA PNEUMONIAE MODERATE CITROBACTER SPECIES ABUNDANT BACTEROIDES SPECIES BETA LACTAMASE POSITIVE Performed at [facility], [address], [place], [place] [address]    Report Status PENDING  Incomplete   Organism ID, Bacteria KLEBSIELLA PNEUMONIAE  Final      Susceptibility   Klebsiella pneumoniae - MIC*    AMPICILLIN  >=32 RESISTANT Resistant     CEFEPIME  <=0.12 SENSITIVE Sensitive     CEFTAZIDIME <=1 SENSITIVE Sensitive     CEFTRIAXONE  <=0.25 SENSITIVE Sensitive     CIPROFLOXACIN  <=0.25 SENSITIVE Sensitive     GENTAMICIN <=1 SENSITIVE Sensitive     IMIPENEM <=0.25 SENSITIVE Sensitive     TRIMETH /SULFA  <=20 SENSITIVE Sensitive     AMPICILLIN /SULBACTAM 8 SENSITIVE Sensitive     PIP/TAZO <=4 SENSITIVE Sensitive ug/mL    * MODERATE KLEBSIELLA PNEUMONIAE     Studies: CT ABDOMEN PELVIS W CONTRAST Result Date: 07/16/2023 CLINICAL DATA:  Worsening flank pain post retroperitoneal abscess drainage EXAM: CT ABDOMEN AND PELVIS WITH CONTRAST TECHNIQUE: Multidetector CT imaging of the abdomen and pelvis was performed using the standard protocol following bolus administration of intravenous contrast. RADIATION DOSE REDUCTION: This exam was performed according to the departmental dose-optimization program which includes automated exposure control, adjustment of the mA and/or kV according to patient size and/or use of iterative reconstruction technique. CONTRAST:  OMNIPAQUE  IOHEXOL  300 MG/ML  SOLN COMPARISON:  07/13/2023 and previous FINDINGS: Lower  chest: Trace right pleural effusion stable since 07/11/2023. Small volume pericardial fluid. Coronary and aortic calcifications. Some linear scarring/atelectasis in the lung bases right greater than left as before. Hepatobiliary: 6 cm right hepatic cyst stable. No new liver lesion or biliary ductal dilatation. Gallbladder is decompressed or absent. Pancreas: Unremarkable. No pancreatic ductal dilatation or surrounding inflammatory changes. Spleen: Splenomegaly, 16.2 cm craniocaudal length, without focal lesion. Adrenals/Urinary Tract: No adrenal mass. 2 mm peripheral calculus mid right renal collecting system. Symmetric renal contours and enhancement without hydronephrosis. Urinary bladder incompletely distended. Stomach/Bowel: Stomach is nondistended. Small bowel decompressed. Normal appendix. The colon is partially distended, with a few scattered diverticula predominately right-sided; no adjacent inflammatory change. Vascular/Lymphatic: Mild scattered aortoiliac calcified plaque without AAA. Portal vein patent. A few subcentimeter left para-aortic and aortocaval lymph nodes. No pelvic or mesenteric adenopathy. Reproductive: Multiple metallic seeds about the prostate. Other: The previously placed pigtail drain catheter has significant retracted, the pigtail projecting in the lateral body wall musculature. Persistent right retroperitoneal/pelvic loculated fluid collection 14.6 cm maximum diameter, only minimally improved from previous. No ascites. No free air. Trace presacral fluid/edema stable. Musculoskeletal: Vertebral endplate spurring at multiple levels in the lower thoracic spine. Mild multilevel spondylitic changes in the lumbar spine. Bilateral hip DJD. IMPRESSION: 1. Significant retraction of right retroperitoneal/pelvic drain catheter, the pigtail projecting in the lateral body wall musculature. Little improvement in right retroperitoneal/pelvic abscess. Findings reviewed with Dr. Darylene Epley. 2. Splenomegaly.  3. Coronary and Aortic Atherosclerosis (ICD10-I70.0). Electronically Signed   By: Nicoletta Barrier M.D.   On: 07/16/2023 08:48      Rosena Conradi, MD  [facility] 07/16/2023  If 7PM-7AM, please contact night-coverage

## 2023-07-16 NOTE — Plan of Care (Signed)

## 2023-07-16 NOTE — Progress Notes (Signed)
 Referring Physician(s): Patrick Kidd  Supervising Physician: Patrick Kidd  Patient Status:  Patrick Kidd - In-pt  Chief Complaint: Right lower abdominal/flank pain/residual retroperitoneal abscess, s/p recent drain placement with subsequent drain retraction; referred now for RP drain replacement   Subjective: Pt c/o pain at RLQ drain site; denies fever,HA,CP,dyspnea, cough,N/V or bleeding   Past Medical History:  Diagnosis Date   Acrophobia    Alcoholic cirrhosis (HCC) 10/04/2015   Anemia    Arthritis    foot by big toe   BPH associated with nocturia    Cataract    removed both eyes   Cholecystitis 05/2022   tx Abx   Chronic cough    PMH of   CLL (chronic lymphocytic leukemia) (HCC)    COVID-19 12/2018   Diabetes mellitus without complication (HCC)    Diverticulosis 07/03/2010   Colonoscopy.    Duodenal ulcer 2017   Fallen arches    Bilateral   GERD (gastroesophageal reflux disease)    Gout    Granuloma annulare    Hx of adenomatous colonic polyps multiple   Hydrocele 2011   Large septated right hydrocele   Liver cyst    Liver lesion    Nonspecific elevation of levels of transaminase or lactic acid dehydrogenase (LDH)    Obesity    Peripheral neuropathy    Plantar fasciitis    PMH of   Portal hypertension (HCC) 2017   Prostate cancer (HCC)    Right shoulder pain 11/2017   Sleep apnea    no cpap, patient denies   Thiamine  deficiency    Wears reading eyeglasses    WPW (Wolff-Parkinson-White syndrome) 02/05/2019   Past Surgical History:  Procedure Laterality Date   BACK SURGERY  04/28/2022   CATARACT EXTRACTION, BILATERAL  12/2011   Dr Patrick Kidd   COLONOSCOPY  2017   COLONOSCOPY W/ POLYPECTOMY  07/03/2010   2 adenomas, diverticulosis on right. Dr Patrick Kidd   CYSTOSCOPY N/A 03/15/2018   Procedure: Patrick Kidd;  Surgeon: Patrick Croak, Kidd;  Location: [facility];  Service: Urology;  Laterality: N/A;  NO SEEDS FOUND IN BLADDER    ESOPHAGOGASTRODUODENOSCOPY (EGD) WITH PROPOFOL  N/A 01/08/2016   Procedure: ESOPHAGOGASTRODUODENOSCOPY (EGD) WITH PROPOFOL ;  Surgeon: Patrick Blacksmith, Kidd;  Location: [facility];  Service: Endoscopy;  Laterality: N/A;   FLEXIBLE SIGMOIDOSCOPY  2000   IR EXCHANGE BILIARY DRAIN  02/17/2023   IR PATIENT EVAL TECH 0-60 MINS  12/26/2022   IR PERC CHOLECYSTOSTOMY  12/05/2022   IR PERC CHOLECYSTOSTOMY  12/09/2022   IR RADIOLOGIST EVAL & MGMT  01/16/2023   RADIOACTIVE SEED IMPLANT N/A 03/15/2018   Procedure: RADIOACTIVE SEED IMPLANT/BRACHYTHERAPY IMPLANT;  Surgeon: Patrick Croak, Kidd;  Location: [facility];  Service: Urology;  Laterality: N/A;   23 SEEDS IMPLANTED   RADIOLOGY WITH ANESTHESIA N/A 12/09/2022   Procedure: IR WITH ANESTHESIA;  Surgeon: Patrick Kidd;  Location: [facility];  Service: Radiology;  Laterality: N/A;   SIGMOIDOSCOPY     SPACE OAR INSTILLATION N/A 03/15/2018   Procedure: SPACE OAR INSTILLATION;  Surgeon: Patrick Croak, Kidd;  Location: [facility];  Service: Urology;  Laterality: N/A;   WISDOM TOOTH EXTRACTION        Allergies: Rifaximin , Beta adrenergic blockers, and Calcium  channel blockers  Medications: Prior to Admission medications   Medication Sig Start Date End Date Taking? Authorizing Provider  gabapentin  (NEURONTIN ) 300 MG capsule Take 1 capsule (300 mg total) by mouth 3 (  three) times daily. Patient taking differently: Take 300-600 mg by mouth See admin instructions. Take 300mg  in the AM and 600mg  in the PM. 05/01/23  Yes Patrick Knuckles, Kidd  insulin  glargine, 2 Unit Dial , (TOUJEO  MAX SOLOSTAR) 300 UNIT/ML Solostar Pen Inject 12 Units into the skin daily. Via SANOFI pt assistance 05/01/23  Yes Patrick Knuckles, Kidd  metFORMIN  (GLUCOPHAGE ) 500 MG tablet TAKE 1 TABLET BY MOUTH TWICE A DAY WITH FOOD 04/23/23  Yes Patrick Knuckles, Kidd  Multiple Vitamins-Minerals (CENTRUM SILVER 50+MEN) TABS Take 1 tablet by mouth daily.   Yes  Provider, Historical, Kidd  pantoprazole  (PROTONIX ) 40 MG tablet TAKE 1 TABLET BY MOUTH 2 TIMES DAILY 30 MINUTES BEFORE MEALS (BREAKFAST AND SUPPER) 06/30/23  Yes Patrick Knuckles, Kidd  cyclobenzaprine  (FLEXERIL ) 10 MG tablet Take 10 mg by mouth 3 (three) times daily. Patient not taking: Reported on 07/10/2023 07/09/23   Provider, Historical, Kidd  HYDROcodone -acetaminophen  (NORCO/VICODIN) 5-325 MG tablet Take 1 tablet by mouth every 6 (six) hours as needed. Patient not taking: Reported on 07/10/2023 07/09/23   Provider, Historical, Kidd  Insulin  Pen Needle (BD PEN NEEDLE MINI ULTRAFINE) 31G X 5 MM MISC INJECT 1 ACT INTO THE SKIN DAILY. USE TO ADMINISTER INSULIN . DX E11.9   Strength: 31G X 5 MM 06/30/23   Patrick Knuckles, Kidd     Vital Signs: BP (!) 148/67 (BP Location: Right Arm)   Pulse (!) 101   Temp 98.9 F (37.2 C) (Oral)   Resp 14   Ht 6' 2 (1.88 m)   Wt 218 lb 14.7 oz (99.3 kg)   SpO2 95%   BMI 28.11 kg/m   Physical Exam: awake,answers questions ok, spouse in room; chest- sl dim BS bases; heart- sl tachy but reg rhythm; abd-soft,+BS,RLQ drain in place but tip retracted into surrounding soft tissues/muscle; no LE edema  Imaging: CT ABDOMEN PELVIS W CONTRAST Result Date: 07/16/2023 CLINICAL DATA:  Worsening flank pain post retroperitoneal abscess drainage EXAM: CT ABDOMEN AND PELVIS WITH CONTRAST TECHNIQUE: Multidetector CT imaging of the abdomen and pelvis was performed using the standard protocol following bolus administration of intravenous contrast. RADIATION DOSE REDUCTION: This exam was performed according to the departmental dose-optimization program which includes automated exposure control, adjustment of the mA and/or kV according to patient size and/or use of iterative reconstruction technique. CONTRAST:  OMNIPAQUE  IOHEXOL  300 MG/ML  SOLN COMPARISON:  07/13/2023 and previous FINDINGS: Lower chest: Trace right pleural effusion stable since 07/11/2023. Small volume pericardial fluid.  Coronary and aortic calcifications. Some linear scarring/atelectasis in the lung bases right greater than left as before. Hepatobiliary: 6 cm right hepatic cyst stable. No new liver lesion or biliary ductal dilatation. Gallbladder is decompressed or absent. Pancreas: Unremarkable. No pancreatic ductal dilatation or surrounding inflammatory changes. Spleen: Splenomegaly, 16.2 cm craniocaudal length, without focal lesion. Adrenals/Urinary Tract: No adrenal mass. 2 mm peripheral calculus mid right renal collecting system. Symmetric renal contours and enhancement without hydronephrosis. Urinary bladder incompletely distended. Stomach/Bowel: Stomach is nondistended. Small bowel decompressed. Normal appendix. The colon is partially distended, with a few scattered diverticula predominately right-sided; no adjacent inflammatory change. Vascular/Lymphatic: Mild scattered aortoiliac calcified plaque without AAA. Portal vein patent. A few subcentimeter left para-aortic and aortocaval lymph nodes. No pelvic or mesenteric adenopathy. Reproductive: Multiple metallic seeds about the prostate. Other: The previously placed pigtail drain catheter has significant retracted, the pigtail projecting in the lateral body wall musculature. Persistent right retroperitoneal/pelvic loculated fluid collection 14.6 cm maximum diameter, only minimally  improved from previous. No ascites. No free air. Trace presacral fluid/edema stable. Musculoskeletal: Vertebral endplate spurring at multiple levels in the lower thoracic spine. Mild multilevel spondylitic changes in the lumbar spine. Bilateral hip DJD. IMPRESSION: 1. Significant retraction of right retroperitoneal/pelvic drain catheter, the pigtail projecting in the lateral body wall musculature. Little improvement in right retroperitoneal/pelvic abscess. Findings reviewed with Dr. Darylene Epley. 2. Splenomegaly. 3. Coronary and Aortic Atherosclerosis (ICD10-I70.0). Electronically Signed   By: Nicoletta Barrier  M.D.   On: 07/16/2023 08:48   CT GUIDED PERITONEAL/RETROPERITONEAL FLUID DRAIN BY PERC CATH Result Date: 07/13/2023 INDICATION: [id number] Abscess [id number] EXAM: CT-GUIDED RIGHT FLANK/RETROPERITONEAL ABSCESS DRAINAGE CATHETER PLACEMENT COMPARISON:  CT AP and MRI abdomen pelvis, 07/11/2023 MEDICATIONS: The patient is currently admitted to the hospital and receiving intravenous antibiotics. The antibiotics were administered within an appropriate time frame prior to the initiation of the procedure. ANESTHESIA/SEDATION: Moderate (conscious) sedation was employed during this procedure. A total of Versed  2 mg and Fentanyl  100 mcg was administered intravenously. Moderate Sedation Time: 23 minutes. The patient's level of consciousness and vital signs were monitored continuously by radiology nursing throughout the procedure under my direct supervision. CONTRAST:  None FLUOROSCOPY: CT dose; 1274 mGycm COMPLICATIONS: None immediate. PROCEDURE: RADIATION DOSE REDUCTION: This exam was performed according to the departmental dose-optimization program which includes automated exposure control, adjustment of the mA and/or kV according to patient size and/or use of iterative reconstruction technique. Informed written consent was obtained from the patient after a discussion of the risks, benefits and alternatives to treatment. The patient was placed supine on the CT gantry and a pre procedural CT was performed re-demonstrating the known abscess/fluid collection within the the RIGHT flank. The procedure was planned. A timeout was performed prior to the initiation of the procedure. The RIGHT 5 was prepped and draped in the usual sterile fashion. The overlying soft tissues were anesthetized with 1% lidocaine  with epinephrine . Appropriate trajectory was planned with the use of a 22 gauge spinal needle. An 18 gauge trocar needle was advanced into the abscess/fluid collection and a short Amplatz super stiff wire was coiled within the collection.  Appropriate positioning was confirmed with a limited CT scan. The tract was serially dilated allowing placement of a 10 Fr drainage catheter. Appropriate positioning was confirmed with a limited postprocedural CT scan. 60 mL of purulent fluid was aspirated. The tube was connected to a bulb suction and sutured in place. A dressing was placed. The patient tolerated the procedure well without immediate post procedural complication. IMPRESSION: Successful CT-guided placement of a 10 Fr drainage catheter into the RIGHT retroperitoneal abscess with aspiration of 60 mL of purulent fluid. Samples were sent to the laboratory as requested by the ordering clinical team. RECOMMENDATIONS: The patient will return to Vascular Interventional Radiology (VIR) for routine drainage catheter evaluation and exchange in 10-14 days. Patrick Largo, Kidd [facility] Electronically Signed   By: Patrick Kidd M.D.   On: 07/13/2023 17:28    Labs:  CBC: Recent Labs    07/13/23 0546 07/14/23 0551 07/15/23 1156 07/16/23 0618  WBC 12.5* 11.4* 11.9* 14.7*  HGB 9.1* 9.7* 9.9* 9.5*  HCT 27.0* 28.6* 30.0* 30.0*  PLT 170 173 229 253    COAGS: Recent Labs    12/03/22 1850 12/08/22 0834 07/09/23 0954 07/10/23 0509 07/11/23 0738 07/12/23 0644  INR 1.5*   < > 1.5* 1.4* 1.4* 1.4*  APTT 32  --   --   --   --   --    < > =  values in this interval not displayed.    BMP: Recent Labs    07/13/23 0546 07/13/23 1804 07/15/23 1156 07/16/23 0618  NA 130* 133* 134* 134*  K 3.8 3.7 3.5 3.4*  CL 99 100 99 99  CO2 23 22 27 24   GLUCOSE 201* 159* 184* 122*  BUN 19 18 17 16   CALCIUM  8.3* 8.5* 8.6* 8.6*  CREATININE 0.96 0.93 0.81 0.85  GFRNONAA >60 >60 >60 >60    LIVER FUNCTION TESTS: Recent Labs    07/09/23 0954 07/09/23 1733 07/10/23 0509 07/15/23 1156  BILITOT 1.4* 1.4* 1.1 1.0  AST 12 17 14* 18  ALT 10 13 12 15   ALKPHOS 109 103 94 103  PROT 6.5 6.3* 5.8*  5.8*  ALBUMIN  3.3* 2.6* 2.3* 2.1*    Assessment and Plan: Pt with hx CLL, alcoholic and NASH cirrhosis with hepatic encephalopathy, thrombocytopenia, portal HTN, H/o SBP in 2020, DM, peripheral neuropathy, prostate cancer, BPH, chronic back pain , osteoarthritis and right RP abscess/infected hematoma, s/p drain placement 07/13/23; now with drain retraction into surrounding soft tissues/muscle; afebrile, WBC 14.7(11.9), hgb 9.5(9.9), creat nl, drain fluid cx- mod klebsiella; existing displaced drain was removed at bedside this am without immediate complications, gauze dressing applied to site. plans now are for RP abscess drain replacement today; images have been reviewed by Dr. Darylene Epley; risks and benefits discussed with the patient /spouse including bleeding, infection, damage to adjacent structures, bowel perforation/fistula connection, and sepsis.  All of the patient's questions were answered, patient is agreeable to proceed. Consent signed and in chart.    Electronically Signed: D. Honore Lux, PA-C 07/16/2023, 10:41 AM   I spent a total of 25 Minutes at the the patient's bedside AND on the patient's hospital floor or unit, greater than 50% of which was counseling/coordinating care for right retroperitoneal abscess drain placement    Patient ID: Patrick Kidd, male   DOB: 04/27/1945, 78 y.o.   MRN: [id number]

## 2023-07-16 NOTE — Procedures (Signed)
 Vascular and Interventional Radiology Procedure Note  Patient: AVERILL PONS DOB: [birth year] Medical Record Number: [id number] Note Date/Time: 07/16/23 2:12 PM   Performing Physician: Art Largo, MD Assistant(s): None  Diagnosis: R flank / RP abscess. Retracted drain, removed.       Procedure: DRAINAGE CATHETER REPLACEMENT into a RIGHT FLANK RETROPERITONEAL ABSCESS   Anesthesia: Conscious Sedation Complications: None Estimated Blood Loss: Minimal Specimens: None   Findings:  Successful CT-guided re-placement of drainage catheter into a R flank / RP collection . A 14 F drain was placed   Plan:  - Flush drain with 5 mL Normal Saline every 8 hours. - Follow up drain evaluation / sinogram in 7 day(s).  See detailed procedure note with images in PACS. The patient tolerated the procedure well without incident or complication and was returned to Recovery in stable condition.    Art Largo, MD [facility] Radiology   Pager. [phone] Clinic. [phone]

## 2023-07-16 NOTE — Progress Notes (Signed)
   07/16/23 1617  PT Visit Information  Last PT Received On 07/16/23  Assistance Needed +1  Reason Eval/Treat Not Completed Fatigue/lethargy limiting ability to participate  History of Present Illness Pt is a 78 yo male presenting to the ED for management of abdominal hematoma, night sweats; fell on his back ~2 weeks ago with CT lumbar spine with moderate central canal stenosis multi-level without acute fx or traumatic sublux. PMH: acrophobia, alcoholic cirrhosis, anemia, BPH, CLL, DM, GERD, gout, peripheral neuropathy, hx of prostate cancer, back surgery,  Wolff-Parkinson-White syndrome   Pt deferred morning attempt to afternoon for check back. Upon PM check in, pt sleeping and wife present, states that he has been very tired since he returned from his procedure and kindly requested follow up next day. Will communicate with team and follow up as indicated.  Darien Eden, PT Acute Rehabilitation Services Office: [phone] 07/16/2023

## 2023-07-16 NOTE — Progress Notes (Signed)
 OT Cancellation Note  Patient Details Name: Patrick Kidd MRN: [id number] DOB: 25-Oct-1945   Cancelled Treatment:    Reason Eval/Treat Not Completed: Patient declined, no reason specified. Pt reported just getting back in the room from a procedure. He stated he was fatigued and asked for OT to check back tomorrow.   Sheralyn Dies, OTR/L 07/16/2023, 5:12 PM

## 2023-07-16 NOTE — TOC Initial Note (Signed)
 Transition of Care [facility]) - Initial/Assessment Note    Patient Details  Name: Patrick Kidd MRN: [id number] Date of Birth: [birth year]  [facility]) CM/SW Contact:    Levie Ream, RN Phone Number: 07/16/2023, 4:17 PM  Clinical Narrative:                 PT recc HHPT/OT; pt previously received sedation in procedural area; spoke w/ wife Quartez Lagos [phone]) in room; she plans for pt to return home at d/c, and will provide transportation; Mrs Saltz verified insurance/PCP; she denied pt experiencing SDOH risks; pt has cane, walker, and wheelchair; he does not have HH services or home oxygen; she agrees to recc services, and does not have an agency preference; referral given to [name] at [place]; he says agency can provide services; agency contact info placed in follow up provider section of d/c instructions; pt's wife notified; TOC is following.   Expected Discharge Plan: Home w Home Health Services Barriers to Discharge: Continued Medical Work up   Patient Goals and CMS Choice Patient states their goals for this hospitalization and ongoing recovery are:: pt's wife Alarik Radu says he will return home CMS Medicare.gov Compare Post Acute Care list provided to:: Patient Choice offered to / list presented to : Spouse [place] ownership interest in [facility].provided to:: Spouse    Expected Discharge Plan and Services   Discharge Planning Services: CM Consult Post Acute Care Choice: Home Health Living arrangements for the past 2 months: Single Family Home                           HH Arranged: PT, OT HH Agency: [facility] Date [facility] Agency Contacted: 07/16/23 Time HH Agency Contacted: 1615 Representative spoke with at [facility] Agency: Randel Buss  Prior Living Arrangements/Services Living arrangements for the past 2 months: Single Family Home Lives with:: Spouse Patient language and need for interpreter reviewed:: Yes Do you feel  safe going back to the place where you live?: Yes      Need for Family Participation in Patient Care: Yes (Comment) Care giver support system in place?: Yes (comment) Current home services: DME (cane, walker, wheelchair) Criminal Activity/Legal Involvement Pertinent to Current Situation/Hospitalization: No - Comment as needed  Activities of Daily Living   ADL Screening (condition at time of admission) Independently performs ADLs?: Yes (appropriate for developmental age) Is the patient deaf or have difficulty hearing?: No Does the patient have difficulty seeing, even when wearing glasses/contacts?: No Does the patient have difficulty concentrating, remembering, or making decisions?: No  Permission Sought/Granted Permission sought to share information with : Case Manager Permission granted to share information with : Yes, Verbal Permission Granted  Share Information with NAME: Case Manager     Permission granted to share info w Relationship: Siddhant Hashemi (wife) [phone]     Emotional Assessment Appearance:: Appears stated age Attitude/Demeanor/Rapport: Unable to Assess Affect (typically observed): Unable to Assess Orientation: :  (unable to assess) Alcohol / Substance Use: Not Applicable Psych Involvement: No (comment)  Admission diagnosis:  Hematoma [T14.8XXA] Fever, unspecified fever cause [R50.9] Intra-abdominal bleeding [R58] Patient Active Problem List   Diagnosis Date Noted   SIRS (systemic inflammatory response syndrome) (HCC) 07/09/2023   Hematoma 07/09/2023   OSA (obstructive sleep apnea) 03/17/2023   Thrombocytopenia (HCC) 03/17/2023   Urinary incontinence 06/25/2021   Degenerative lumbar spinal stenosis 04/30/2021   Degenerative arthritis of knee, bilateral 03/27/2021  Chronic lymphocytic leukemia (HCC) 11/08/2020   Insulin -requiring or dependent type II diabetes mellitus (HCC) 09/25/2020   Thiamine  deficiency    Paroxysmal atrial fibrillation (HCC)  07/19/2019   Allergic rhinitis 06/09/2019   Erectile dysfunction due to diabetes mellitus (HCC) 06/09/2019   Overweight with body mass index (BMI) of 28 to 28.9 in adult 03/17/2019   WPW (Wolff-Parkinson-White syndrome) 02/05/2019   Encephalopathy, hepatic (HCC)    Degenerative disc disease, cervical 07/21/2018   Cervical radiculopathy 07/16/2018   OAB (overactive bladder) 07/06/2018   Malignant neoplasm of prostate (HCC) 01/13/2018   Hyperlipidemia LDL goal <100 11/11/2017   Essential hypertension 11/11/2017   BPH associated with nocturia 11/11/2017   Erectile dysfunction due to arterial insufficiency 04/07/2017   Peripheral vascular disease (HCC) 06/25/2016   Alcoholic cirrhosis (HCC) 10/04/2015   Type 2 diabetes mellitus with complication, with long-term current use of insulin  (HCC) 08/22/2015   Hypersomnolence 03/19/2014   Peripheral neuropathy 01/11/2013   Polyclonal gammopathy 01/11/2013   GERD 10/04/2009   Benign prostatic hyperplasia without lower urinary tract symptoms 02/04/1999   Gout, unspecified 02/04/1999   Portal hypertension (HCC) 02/04/1999   PCP:  [name], MD Pharmacy:   [facility] [phone] - , [place] - [address] AT [facility] [address] [place] [address] Phone: [phone] Fax: [phone]  [facility] [address] [place] [address] Phone: [phone] Fax: [phone]  [facility] - [facility] [address] [place] [address] Phone: [phone] Fax: [phone]     Social Drivers of Health (SDOH) Social History: SDOH Screenings   Food Insecurity: No Food Insecurity (07/16/2023)  Housing: Low Risk  (07/16/2023)  Transportation Needs: No Transportation Needs (07/16/2023)  Utilities: Not At Risk (07/16/2023)  Alcohol Screen: Low Risk  (12/02/2021)  Depression (PHQ2-9): Low Risk  (04/28/2023)  Financial Resource Strain: Low  Risk  (03/21/2023)   Received from [facility]  Physical Activity: Inactive (12/02/2021)  Social Connections: Moderately Integrated (12/02/2021)  Stress: No Stress Concern Present (12/02/2021)  Tobacco Use: Medium Risk (07/12/2023)   SDOH Interventions: Food Insecurity Interventions: Intervention Not Indicated, Inpatient TOC Housing Interventions: Intervention Not Indicated, Inpatient TOC Transportation Interventions: Intervention Not Indicated, Inpatient TOC Utilities Interventions: Intervention Not Indicated, Inpatient TOC   Readmission Risk Interventions    07/16/2023    4:12 PM  Readmission Risk Prevention Plan  Transportation Screening Complete  PCP or Specialist Appt within 5-7 Days Complete  Home Care Screening Complete  Medication Review (RN CM) Complete

## 2023-07-16 NOTE — Plan of Care (Signed)
   Problem: Education: Goal: Knowledge of General Education information will improve Description: Including pain rating scale, medication(s)/side effects and non-pharmacologic comfort measures Outcome: Progressing   Problem: Clinical Measurements: Goal: Ability to maintain clinical measurements within normal limits will improve Outcome: Progressing Goal: Will remain free from infection Outcome: Progressing

## 2023-07-17 ENCOUNTER — Other Ambulatory Visit: Payer: Self-pay

## 2023-07-17 DIAGNOSIS — B961 Klebsiella pneumoniae [K. pneumoniae] as the cause of diseases classified elsewhere: Secondary | ICD-10-CM | POA: Diagnosis not present

## 2023-07-17 DIAGNOSIS — K6811 Postprocedural retroperitoneal abscess: Secondary | ICD-10-CM

## 2023-07-17 DIAGNOSIS — R6510 Systemic inflammatory response syndrome (SIRS) of non-infectious origin without acute organ dysfunction: Secondary | ICD-10-CM | POA: Diagnosis not present

## 2023-07-17 LAB — GLUCOSE, CAPILLARY
Glucose-Capillary: 175 mg/dL — ABNORMAL HIGH (ref 70–99)
Glucose-Capillary: 179 mg/dL — ABNORMAL HIGH (ref 70–99)
Glucose-Capillary: 138 mg/dL — ABNORMAL HIGH (ref 70–99)
Glucose-Capillary: 150 mg/dL — ABNORMAL HIGH (ref 70–99)

## 2023-07-17 MED ORDER — POTASSIUM CHLORIDE CRYS ER 20 MEQ PO TBCR
40.0000 meq | EXTENDED_RELEASE_TABLET | Freq: Once | ORAL | Status: AC
  Administered 2023-07-17: 40 meq via ORAL
  Filled 2023-07-17: qty 2

## 2023-07-17 MED ORDER — ERTAPENEM IV (FOR PTA / DISCHARGE USE ONLY): 1.0000 g | INTRAVENOUS | 0 refills | Status: AC

## 2023-07-17 MED ORDER — SODIUM CHLORIDE 0.9 % IV SOLN
1.0000 g | Freq: Three times a day (TID) | INTRAVENOUS | Status: DC
  Filled 2023-07-17: qty 20

## 2023-07-17 MED ORDER — SODIUM CHLORIDE 0.9 % IV SOLN
1.0000 g | INTRAVENOUS | Status: DC
  Administered 2023-07-17 – 2023-07-20 (×4): 1 g via INTRAVENOUS
  Filled 2023-07-17 (×5): qty 1000

## 2023-07-17 NOTE — Progress Notes (Signed)
 Occupational Therapy Treatment Patient Details Name: Patrick Kidd MRN: [id number] DOB: January 04, 1946 Today's Date: 07/17/2023   History of present illness Pt is a 78 yo male presenting to the ED for management of abdominal hematoma, night sweats; fell on his back ~2 weeks ago with CT lumbar spine with moderate central canal stenosis multi-level without acute fx or traumatic sublux. PMH: acrophobia, alcoholic cirrhosis, anemia, BPH, CLL, DM, GERD, gout, peripheral neuropathy, hx of prostate cancer, back surgery,  Wolff-Parkinson-White syndrome   OT comments  Patient seen for skilled OT this am. Significant gains in basic functional mobility and BADL's despite ongoing report of fatigue. Educated on energy conservation and BORG scale use as well as B UE cane ex to be completed between visits seated for UE and overall strengthening. Patient continues to require skilled OT while in Acute setting and recommendation for HHOT remains appropriate.       If plan is discharge home, recommend the following:  A little help with walking and/or transfers;A little help with bathing/dressing/bathroom;Assistance with cooking/housework;Assistance with feeding;Direct supervision/assist for medications management;Direct supervision/assist for financial management;Assist for transportation;Help with stairs or ramp for entrance;Supervision due to cognitive status   Equipment Recommendations  None recommended by OT       Precautions / Restrictions Precautions Precautions: Fall;Other (comment) (drain and abdominal binder) Recall of Precautions/Restrictions: Intact Precaution/Restrictions Comments: R hip (pt states this is 2* positioning during surgery); abdominal binder, R JP drain Restrictions Weight Bearing Restrictions Per Provider Order: No       Mobility Bed Mobility Overal bed mobility: Needs Assistance Bed Mobility: Supine to Sit Rolling: Min assist Sidelying to sit: HOB elevated, Used rails, Min  assist       General bed mobility comments: HOB slight elevated and pt encouraged to perform log roll and use of bed rail, pt required min A for R LE to EOB and mod A for trunk    Transfers Overall transfer level: Needs assistance Equipment used: Rolling walker (2 wheels) Transfers: Sit to/from Stand Sit to Stand: Contact guard assist           General transfer comment: min cues     Balance Overall balance assessment: Needs assistance, History of Falls Sitting-balance support: Feet supported, No upper extremity supported Sitting balance-Leahy Scale: Good Sitting balance - Comments: Pt able to sit statically EOB but with MMT pt falling backwards and L laterally requiring UE support or PT modA to correct Postural control: Posterior lean, Left lateral lean Standing balance support: During functional activity, Reliant on assistive device for balance, Bilateral upper extremity supported Standing balance-Leahy Scale: Fair Standing balance comment: Pt initially standing with backs of legs on bed, then with cuing able to stand upright                           ADL either performed or assessed with clinical judgement   ADL Overall ADL's : Needs assistance/impaired Eating/Feeding: Sitting;Set up   Grooming: Wash/dry face;Wash/dry hands;Sitting;Modified independent               Lower Body Dressing: Minimal assistance;Sitting/lateral leans Lower Body Dressing Details (indicate cue type and reason): sititng for figure 4 for socks and sit to stand with RW Toilet Transfer: Contact guard assist;Rolling walker (2 wheels)           Functional mobility during ADLs: Contact guard assist;Rolling walker (2 wheels) General ADL Comments: overall min A and min cues, reports fatigues easily  Extremity/Trunk Assessment Upper Extremity Assessment Upper Extremity Assessment: Overall WFL for tasks assessed   Lower Extremity Assessment Lower Extremity Assessment: Defer to PT  evaluation        Vision   Vision Assessment?: No apparent visual deficits         Communication Communication Communication: No apparent difficulties   Cognition Arousal: Alert Behavior During Therapy: WFL for tasks assessed/performed Cognition: Cognition impaired             OT - Cognition Comments: improved overall processing and awareness but contiues to be delayed from baseline for insight                 Following commands: Intact Following commands impaired: Follows one step commands with increased time      Cueing   Cueing Techniques: Verbal cues, Tactile cues  Exercises Exercises: General Upper Extremity (B UE cane ex in all planes 10 reps each for carryover for home)       General Comments modified BORG scale 5/10 after amb    Pertinent Vitals/ Pain       Pain Assessment Pain Assessment: Faces Faces Pain Scale: Hurts a little bit Pain Location: R hip Pain Descriptors / Indicators: Sore, Aching Pain Intervention(s): Monitored during session, Repositioned   Frequency  Min 2X/week        Progress Toward Goals  OT Goals(current goals can now be found in the care plan section)  Progress towards OT goals: Progressing toward goals  Acute Rehab OT Goals Patient Stated Goal: to get stronger OT Goal Formulation: With patient Time For Goal Achievement: 07/26/23 Potential to Achieve Goals: Good ADL Goals Pt Will Perform Grooming: with modified independence;standing Pt Will Perform Lower Body Dressing: with modified independence;sit to/from stand;with adaptive equipment Pt Will Transfer to Toilet: with modified independence;ambulating;regular height toilet Pt Will Perform Toileting - Clothing Manipulation and hygiene: with modified independence;sit to/from stand  Plan      Co-evaluation    PT/OT/SLP Co-Evaluation/Treatment: Yes Reason for Co-Treatment: Complexity of the patient's impairments (multi-system involvement);Necessary to address  cognition/behavior during functional activity;For patient/therapist safety PT goals addressed during session: Mobility/safety with mobility;Balance;Proper use of DME OT goals addressed during session: ADL's and self-care;Proper use of Adaptive equipment and DME      AM-PAC OT 6 Clicks Daily Activity     Outcome Measure   Help from another person eating meals?: A Little Help from another person taking care of personal grooming?: A Little Help from another person toileting, which includes using toliet, bedpan, or urinal?: A Lot Help from another person bathing (including washing, rinsing, drying)?: A Little Help from another person to put on and taking off regular upper body clothing?: A Little Help from another person to put on and taking off regular lower body clothing?: A Little 6 Click Score: 17    End of Session Equipment Utilized During Treatment: Rolling walker (2 wheels)  OT Visit Diagnosis: Unsteadiness on feet (R26.81);Other abnormalities of gait and mobility (R26.89)   Activity Tolerance Patient tolerated treatment well   Patient Left in chair;with call bell/phone within reach;with chair alarm set;with family/visitor present   Nurse Communication Mobility status        Time: 6578-4696 OT Time Calculation (min): 30 min  Charges: OT General Charges $OT Visit: 1 Visit OT Treatments $Self Care/Home Management : 8-22 mins  Nakkia Mackiewicz OT/L Acute Rehabilitation Department  [phone]  07/17/2023, 2:38 PM

## 2023-07-17 NOTE — Progress Notes (Addendum)
 PROGRESS NOTE  HAWKE VILLALPANDO AOZ:[id number] DOB: 08-24-1945 DOA: 07/09/2023 PCP: [name], MD   LOS: 7 days   Brief narrative:  Patient is a 78 years old male with CLL, alcoholic and NASH cirrhosis with hepatic encephalopathy, thrombocytopenia, portal HTN,  H/o SBP in 2020, DM, peripheral neuropathy, prostate cancer, BPH, chronic back pain and osteoarthritis presented to the hospital abnormal CT of the abd/pelvis.  He reported fever 3 to 4 weeks ago with increasing weakness and decreased appetite and a fall 3 weeks back.  He had seen GI on 07/09/2023 due to abdominal pain and lack of appetite.  CT scan of the abdomen and pelvis done showed 9.5 x 12.7 collection in right lower abdomen favoring probable evolving hematoma with splenomegaly.  Patient was then admitted to the hospital for further evaluation and treatment. During hospitalization, fevers did not resolve despite IV antibiotics and blood cultures were negative.  IR drain placed on 6/9.  Hospital course has been complicated by confusion and agitation exacerbated by the narcotics he is required for his pain.     Assessment/Plan: Principal Problem:   SIRS (systemic inflammatory response syndrome) (HCC) Active Problems:   Type 2 diabetes mellitus with complication, with long-term current use of insulin  (HCC)   Essential hypertension   WPW (Wolff-Parkinson-White syndrome)   Chronic lymphocytic leukemia (HCC)   BPH associated with nocturia   Gout, unspecified   OSA (obstructive sleep apnea)   Portal hypertension (HCC)   Thrombocytopenia (HCC)   Hematoma  Sepsis secondary to retroperitoneal hematoma infection. Patient was initially on vancomycin  and cefepime .  Blood cultures negative so far in 5 days.  Status post IR intervention and drainage of 60 cc of purulent fluid.  Patient had drain tube displaced show had to be reinserted by interventional radiology.   Fluid culture with Klebsiella pneumonia, Citrobacter..  Currently on  Zosyn  but will change to meropenem.  Will also get ID opinion for choice of antibiotic duration in this situation.   Acute toxic encephalopathy  Likely secondary to narcotics and infection.  Continue Tylenol  and tramadol  for pain management.  Alert awake and oriented at this time.  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 Elevated INR Intermittent thrombocytopenia Platelets have improved.  No evidence of bleeding.     Type 2 diabetes mellitus with complication, with long-term current use of insulin , uncontrolled Patient is on metformin , Toujeo  12 units daily but according to his wife, he does not always adhere to a diabetic diet.  Continue with Semglee  and NovoLog .  Latest POC glucose of 175.  Continue diabetic diet    Chronic lymphocytic leukemia  Last evaluated in October 2024-did not need treatment at that time  Hypokalemia.  Mild.  Will replace.  Check levels in AM.  Hypomagnesemia.  Improved after replacement.  Latest magnesium  2.1.  Debility deconditioning.  Seen by PT OT and recommend home health.    DVT prophylaxis: Place and maintain sequential compression device Start: 07/09/23 2027   Disposition: Likely home with home health in 1 to 2 days  Status is: Inpatient Remains inpatient appropriate because: IV antibiotic, abdominal drain, pending clinical improvement, ID opinion.  Code Status:     Code Status: Full Code  Family Communication: Spoke with the patient's wife at bedside on 07/17/2023  Consultants: Interventional radiology ID  Procedures: CT-guided retroperitoneal abscess drainage catheter placement on 07/13/2023 by interventional radiology  Anti-infectives:  Meropenem 6/13>  Anti-infectives (From admission, onward)    Start     Dose/Rate Route  Frequency Ordered Stop   07/17/23 1400  meropenem (MERREM) 1 g in sodium chloride  0.9 % 100 mL IVPB        1 g 200 mL/hr over 30 Minutes Intravenous Every 8 hours 07/17/23 1004     07/13/23 1230   piperacillin -tazobactam (ZOSYN ) IVPB 3.375 g  Status:  Discontinued        3.375 g 12.5 mL/hr over 240 Minutes Intravenous Every 8 hours 07/13/23 0814 07/17/23 1004   07/10/23 1700  vancomycin  (VANCOREADY) IVPB 2000 mg/400 mL  Status:  Discontinued        2,000 mg 200 mL/hr over 120 Minutes Intravenous Every 24 hours 07/10/23 0017 07/13/23 0810   07/10/23 0400  ceFEPIme  (MAXIPIME ) 2 g in sodium chloride  0.9 % 100 mL IVPB  Status:  Discontinued        2 g 200 mL/hr over 30 Minutes Intravenous Every 8 hours 07/10/23 0017 07/13/23 0809   07/09/23 1845  vancomycin  (VANCOREADY) IVPB 2000 mg/400 mL        2,000 mg 200 mL/hr over 120 Minutes Intravenous  Once 07/09/23 1836 07/09/23 2152   07/09/23 1845  ceFEPIme  (MAXIPIME ) 2 g in sodium chloride  0.9 % 100 mL IVPB        2 g 200 mL/hr over 30 Minutes Intravenous  Once 07/09/23 1836 07/09/23 2024        Subjective: Today, patient was seen and examined at bedside.  Patient denies interval complaints.  Denies any fevers chills nausea vomiting or overt abdominal pain.  Has had a new drain with some clotted blood   Objective: Vitals:   07/16/23 2142 07/17/23 0535  BP: (!) 163/64 (!) 142/68  Pulse: 95 87  Resp: 16 16  Temp: 98.2 F (36.8 C) 98.1 F (36.7 C)  SpO2: 95% 95%    Intake/Output Summary (Last 24 hours) at 07/17/2023 1119 Last data filed at 07/17/2023 0272 Gross per 24 hour  Intake 574.56 ml  Output 1295 ml  Net -720.44 ml   Filed Weights   07/09/23 1810  Weight: 99.3 kg   Body mass index is 28.11 kg/m.   Physical Exam:  GENERAL: Patient is alert awake and oriented. Not in obvious distress. HENT: No scleral pallor or icterus. Pupils equally reactive to light. Oral mucosa is moist NECK: is supple, no gross swelling noted. CHEST: Clear to auscultation.  Diminished breath sounds bilaterally. CVS: S1 and S2 heard, no murmur. Regular rate and rhythm.  ABDOMEN: Soft, non-tender, bowel sounds are present.  Abdominal binder  in place with abdominal drain with some hemorrhagic fluid. EXTREMITIES: No edema. CNS: Cranial nerves are intact. No focal motor deficits. SKIN: warm and dry without rashes.  Data Review: I have personally reviewed the following laboratory data and studies,  CBC: Recent Labs  Lab 07/12/23 0644 07/13/23 0546 07/14/23 0551 07/15/23 1156 07/16/23 0618  WBC 12.2* 12.5* 11.4* 11.9* 14.7*  HGB 9.1* 9.1* 9.7* 9.9* 9.5*  HCT 27.4* 27.0* 28.6* 30.0* 30.0*  MCV 91.9 90.3 89.4 90.9 92.6  PLT 192 170 173 229 253   Basic Metabolic Panel: Recent Labs  Lab 07/12/23 0644 07/13/23 0546 07/13/23 1804 07/15/23 1156 07/16/23 0618  NA 132* 130* 133* 134* 134*  K 3.9 3.8 3.7 3.5 3.4*  CL 101 99 100 99 99  CO2 21* 23 22 27 24   GLUCOSE 204* 201* 159* 184* 122*  BUN 19 19 18 17 16   CREATININE 0.97 0.96 0.93 0.81 0.85  CALCIUM  8.2* 8.3* 8.5* 8.6* 8.6*  MG  --   --   --  1.5* 2.1   Liver Function Tests: Recent Labs  Lab 07/15/23 1156  AST 18  ALT 15  ALKPHOS 103  BILITOT 1.0  PROT 5.8*  ALBUMIN  2.1*   No results for input(s): LIPASE, AMYLASE in the last 168 hours.  Recent Labs  Lab 07/13/23 1417  AMMONIA 19   Cardiac Enzymes: No results for input(s): CKTOTAL, CKMB, CKMBINDEX, TROPONINI in the last 168 hours. BNP (last 3 results) Recent Labs    12/08/22 0452  BNP 623.7*    ProBNP (last 3 results) No results for input(s): PROBNP in the last 8760 hours.  CBG: Recent Labs  Lab 07/16/23 0718 07/16/23 1104 07/16/23 1547 07/16/23 2143 07/17/23 0737  GLUCAP 121* 144* 116* 141* 175*   Recent Results (from the past 240 hours)  Blood culture (routine x 2)     Status: None   Collection Time: 07/09/23  6:25 PM   Specimen: BLOOD RIGHT FOREARM  Result Value Ref Range Status   Specimen Description   Final    BLOOD RIGHT FOREARM Performed at [facility], [address], [place], [place] [address]    Special Requests   Final    BOTTLES DRAWN AEROBIC  AND ANAEROBIC Blood Culture results may not be optimal due to an inadequate volume of blood received in culture bottles Performed at [facility], [address] [place], [place] [address]    Culture   Final    NO GROWTH 5 DAYS Performed at [facility], [address], [place], [place] [address]    Report Status 07/14/2023 FINAL  Final  Blood culture (routine x 2)     Status: None   Collection Time: 07/09/23  6:26 PM   Specimen: BLOOD  Result Value Ref Range Status   Specimen Description   Final    BLOOD LEFT ANTECUBITAL Performed at [facility], [address], [place], [place] [address]    Special Requests   Final    BOTTLES DRAWN AEROBIC AND ANAEROBIC Blood Culture results may not be optimal due to an inadequate volume of blood received in culture bottles Performed at [facility], [address], [place], [place] [address]    Culture   Final    NO GROWTH 5 DAYS Performed at [facility], [address] [place], [place] [address]    Report Status 07/14/2023 FINAL  Final  Resp panel by RT-PCR (RSV, Flu A&B, Covid) Anterior Nasal Swab     Status: None   Collection Time: 07/10/23  8:42 AM   Specimen: Anterior Nasal Swab  Result Value Ref Range Status   SARS Coronavirus 2 by RT PCR NEGATIVE NEGATIVE Final    Comment: (NOTE) SARS-CoV-2 target nucleic acids are NOT DETECTED.  The SARS-CoV-2 RNA is generally detectable in upper respiratory specimens during the acute phase of infection. The lowest concentration of SARS-CoV-2 viral copies this assay can detect is 138 copies/mL. A negative result does not preclude SARS-Cov-2 infection and should not be used as the sole basis for treatment or other patient management decisions. A negative result may occur with  improper specimen collection/handling, submission of specimen other than nasopharyngeal swab, presence of viral mutation(s) within the areas targeted by  this assay, and inadequate number of viral copies(<138 copies/mL). A negative result must be combined with clinical observations, patient history, and epidemiological information. The expected result is Negative.  Fact Sheet for Patients:  BloggerCourse.com  Fact Sheet for Healthcare Providers:  SeriousBroker.it  This test is no t yet approved or cleared by the [place]  FDA and  has been authorized for detection and/or diagnosis of SARS-CoV-2 by FDA under an Emergency Use Authorization (EUA). This EUA will remain  in effect (meaning this test can be used) for the duration of the COVID-19 declaration under Section 564(b)(1) of the Act, 21 U.S.C.section 360bbb-3(b)(1), unless the authorization is terminated  or revoked sooner.       Influenza A by PCR NEGATIVE NEGATIVE Final   Influenza B by PCR NEGATIVE NEGATIVE Final    Comment: (NOTE) The Xpert Xpress SARS-CoV-2/FLU/RSV plus assay is intended as an aid in the diagnosis of influenza from Nasopharyngeal swab specimens and should not be used as a sole basis for treatment. Nasal washings and aspirates are unacceptable for Xpert Xpress SARS-CoV-2/FLU/RSV testing.  Fact Sheet for Patients: BloggerCourse.com  Fact Sheet for Healthcare Providers: SeriousBroker.it  This test is not yet approved or cleared by the [place]  FDA and has been authorized for detection and/or diagnosis of SARS-CoV-2 by FDA under an Emergency Use Authorization (EUA). This EUA will remain in effect (meaning this test can be used) for the duration of the COVID-19 declaration under Section 564(b)(1) of the Act, 21 U.S.C. section 360bbb-3(b)(1), unless the authorization is terminated or revoked.     Resp Syncytial Virus by PCR NEGATIVE NEGATIVE Final    Comment: (NOTE) Fact Sheet for Patients: BloggerCourse.com  Fact Sheet  for Healthcare Providers: SeriousBroker.it  This test is not yet approved or cleared by the [place]  FDA and has been authorized for detection and/or diagnosis of SARS-CoV-2 by FDA under an Emergency Use Authorization (EUA). This EUA will remain in effect (meaning this test can be used) for the duration of the COVID-19 declaration under Section 564(b)(1) of the Act, 21 U.S.C. section 360bbb-3(b)(1), unless the authorization is terminated or revoked.  Performed at [facility], [address] [place], [place] [address]   Respiratory (~20 pathogens) panel by PCR     Status: None   Collection Time: 07/10/23  8:42 AM   Specimen: Nasopharyngeal Swab; Respiratory  Result Value Ref Range Status   Adenovirus NOT DETECTED NOT DETECTED Final   Coronavirus 229E NOT DETECTED NOT DETECTED Final    Comment: (NOTE) The Coronavirus on the Respiratory Panel, DOES NOT test for the novel  Coronavirus (2019 nCoV)    Coronavirus HKU1 NOT DETECTED NOT DETECTED Final   Coronavirus NL63 NOT DETECTED NOT DETECTED Final   Coronavirus OC43 NOT DETECTED NOT DETECTED Final   Metapneumovirus NOT DETECTED NOT DETECTED Final   Rhinovirus / Enterovirus NOT DETECTED NOT DETECTED Final   Influenza A NOT DETECTED NOT DETECTED Final   Influenza B NOT DETECTED NOT DETECTED Final   Parainfluenza Virus 1 NOT DETECTED NOT DETECTED Final   Parainfluenza Virus 2 NOT DETECTED NOT DETECTED Final   Parainfluenza Virus 3 NOT DETECTED NOT DETECTED Final   Parainfluenza Virus 4 NOT DETECTED NOT DETECTED Final   Respiratory Syncytial Virus NOT DETECTED NOT DETECTED Final   Bordetella pertussis NOT DETECTED NOT DETECTED Final   Bordetella Parapertussis NOT DETECTED NOT DETECTED Final   Chlamydophila pneumoniae NOT DETECTED NOT DETECTED Final   Mycoplasma pneumoniae NOT DETECTED NOT DETECTED Final    Comment: Performed at [facility], [address] [place], [place]  [address]  Aerobic/Anaerobic Culture w Gram Stain (surgical/deep wound)     Status: None   Collection Time: 07/13/23  4:17 PM   Specimen:  Abscess  Result Value Ref Range Status   Specimen Description   Final    ABSCESS Performed at [facility], [address] [place], [place] [address]    Special Requests   Final    NONE Performed at [facility], [address], [place], [place] [address]    Gram Stain   Final    ABUNDANT WBC PRESENT, PREDOMINANTLY PMN RARE GRAM NEGATIVE RODS    Culture   Final    MODERATE KLEBSIELLA PNEUMONIAE MODERATE CITROBACTER SPECIES ABUNDANT BACTEROIDES OVATUS BETA LACTAMASE POSITIVE Performed at [facility], [address], [place], [place] [address]    Report Status 07/17/2023 FINAL  Final   Organism ID, Bacteria KLEBSIELLA PNEUMONIAE  Final   Organism ID, Bacteria CITROBACTER SPECIES  Final      Susceptibility   Citrobacter species - MIC*    CEFEPIME  <=0.12 SENSITIVE Sensitive     CEFTAZIDIME >=64 RESISTANT Resistant     CEFTRIAXONE  >=64 RESISTANT Resistant     CIPROFLOXACIN  >=4 RESISTANT Resistant     GENTAMICIN <=1 SENSITIVE Sensitive     IMIPENEM 0.5 SENSITIVE Sensitive     TRIMETH /SULFA  >=320 RESISTANT Resistant     PIP/TAZO 16 SENSITIVE Sensitive ug/mL    * MODERATE CITROBACTER SPECIES   Klebsiella pneumoniae - MIC*    AMPICILLIN  >=32 RESISTANT Resistant     CEFEPIME  <=0.12 SENSITIVE Sensitive     CEFTAZIDIME <=1 SENSITIVE Sensitive     CEFTRIAXONE  <=0.25 SENSITIVE Sensitive     CIPROFLOXACIN  <=0.25 SENSITIVE Sensitive     GENTAMICIN <=1 SENSITIVE Sensitive     IMIPENEM <=0.25 SENSITIVE Sensitive     TRIMETH /SULFA  <=20 SENSITIVE Sensitive     AMPICILLIN /SULBACTAM 8 SENSITIVE Sensitive     PIP/TAZO <=4 SENSITIVE Sensitive ug/mL    * MODERATE KLEBSIELLA PNEUMONIAE     Studies: CT GUIDED PERITONEAL/RETROPERITONEAL FLUID DRAIN BY PERC CATH Result Date: 07/16/2023 INDICATION: [id number]  Retroperitoneal abscess (HCC) [id number] Briefly, 78 year old male with RIGHT flank retroperitoneal abscess s/p recent drain placement 07/13/2023. Patient is reportedly encephalopathic and pulled out his catheter. Request for replacement EXAM: CT-GUIDED RIGHT FLANK/RETROPERITONEAL ABSCESS DRAINAGE CATHETER PLACEMENT COMPARISON:  IR CT, 07/11/2023.  CT AP, 07/15/2023. MEDICATIONS: The patient is currently admitted to the hospital and receiving intravenous antibiotics. The antibiotics were administered within an appropriate time frame prior to the initiation of the procedure. ANESTHESIA/SEDATION: Local anesthetic and single agent sedation was employed during this procedure. A total of fentanyl  100 mcg was administered intravenously. The patient's level of consciousness and vital signs were monitored continuously by radiology nursing throughout the procedure under my direct supervision. CONTRAST:  None FLUOROSCOPY TIME:  CT dose; 985 mGycm COMPLICATIONS: None immediate. PROCEDURE: RADIATION DOSE REDUCTION: This exam was performed according to the departmental dose-optimization program which includes automated exposure control, adjustment of the mA and/or kV according to patient size and/or use of iterative reconstruction technique. Informed written consent was obtained from the patient after a discussion of the risks, benefits and alternatives to treatment. The patient was placed supine on the CT gantry and a pre procedural CT was performed re-demonstrating the known abscess/fluid collection within the RIGHT retroperitoneum. The procedure was planned. A timeout was performed prior to the initiation of the procedure. The RIGHT flank was prepped and draped in the usual sterile fashion. The overlying soft tissues were anesthetized with 1% lidocaine  with epinephrine . Appropriate trajectory was planned with the use of a 22 gauge spinal needle. An 18 gauge trocar needle was advanced  into the abscess/fluid collection and a short  Amplatz super stiff wire was coiled within the collection. Appropriate positioning was confirmed with a limited CT scan. The tract was serially dilated allowing placement of a 14 Fr drainage catheter. Appropriate positioning was confirmed with a limited postprocedural CT scan. 10 mL of purulent fluid was aspirated. The tube was connected to a bulb suction and sutured in place. A dressing was placed. The patient tolerated the procedure well without immediate post procedural complication. IMPRESSION: Successful CT-guided placement of a 14 Fr drainage catheter into the RIGHT retroperitoneal abscess. RECOMMENDATIONS: The patient will return to Vascular Interventional Radiology (VIR) for routine drainage catheter evaluation and exchange in 10-14 days. Art Largo, MD [facility] Electronically Signed   By: Art Largo M.D.   On: 07/16/2023 15:49   CT ABDOMEN PELVIS W CONTRAST Result Date: 07/16/2023 CLINICAL DATA:  Worsening flank pain post retroperitoneal abscess drainage EXAM: CT ABDOMEN AND PELVIS WITH CONTRAST TECHNIQUE: Multidetector CT imaging of the abdomen and pelvis was performed using the standard protocol following bolus administration of intravenous contrast. RADIATION DOSE REDUCTION: This exam was performed according to the departmental dose-optimization program which includes automated exposure control, adjustment of the mA and/or kV according to patient size and/or use of iterative reconstruction technique. CONTRAST:  OMNIPAQUE  IOHEXOL  300 MG/ML  SOLN COMPARISON:  07/13/2023 and previous FINDINGS: Lower chest: Trace right pleural effusion stable since 07/11/2023. Small volume pericardial fluid. Coronary and aortic calcifications. Some linear scarring/atelectasis in the lung bases right greater than left as before. Hepatobiliary: 6 cm right hepatic cyst stable. No new liver lesion or biliary ductal dilatation. Gallbladder is decompressed  or absent. Pancreas: Unremarkable. No pancreatic ductal dilatation or surrounding inflammatory changes. Spleen: Splenomegaly, 16.2 cm craniocaudal length, without focal lesion. Adrenals/Urinary Tract: No adrenal mass. 2 mm peripheral calculus mid right renal collecting system. Symmetric renal contours and enhancement without hydronephrosis. Urinary bladder incompletely distended. Stomach/Bowel: Stomach is nondistended. Small bowel decompressed. Normal appendix. The colon is partially distended, with a few scattered diverticula predominately right-sided; no adjacent inflammatory change. Vascular/Lymphatic: Mild scattered aortoiliac calcified plaque without AAA. Portal vein patent. A few subcentimeter left para-aortic and aortocaval lymph nodes. No pelvic or mesenteric adenopathy. Reproductive: Multiple metallic seeds about the prostate. Other: The previously placed pigtail drain catheter has significant retracted, the pigtail projecting in the lateral body wall musculature. Persistent right retroperitoneal/pelvic loculated fluid collection 14.6 cm maximum diameter, only minimally improved from previous. No ascites. No free air. Trace presacral fluid/edema stable. Musculoskeletal: Vertebral endplate spurring at multiple levels in the lower thoracic spine. Mild multilevel spondylitic changes in the lumbar spine. Bilateral hip DJD. IMPRESSION: 1. Significant retraction of right retroperitoneal/pelvic drain catheter, the pigtail projecting in the lateral body wall musculature. Little improvement in right retroperitoneal/pelvic abscess. Findings reviewed with Dr. Darylene Epley. 2. Splenomegaly. 3. Coronary and Aortic Atherosclerosis (ICD10-I70.0). Electronically Signed   By: Nicoletta Barrier M.D.   On: 07/16/2023 08:48      Rosena Conradi, MD  [facility] 07/17/2023  If 7PM-7AM, please contact night-coverage

## 2023-07-17 NOTE — Consult Note (Signed)
 [facility]    Date of Admission:  07/09/2023   Total days of inpatient antibiotics 8        Reason for Consult: Retroperitoneal abscess    Principal Problem:   SIRS (systemic inflammatory response syndrome) (HCC) Active Problems:   Type 2 diabetes mellitus with complication, with long-term current use of insulin  (HCC)   Essential hypertension   BPH associated with nocturia   WPW (Wolff-Parkinson-White syndrome)   Chronic lymphocytic leukemia (HCC)   Gout, unspecified   OSA (obstructive sleep apnea)   Portal hypertension (HCC)   Thrombocytopenia (HCC)   Hematoma   Assessment: 78 year old male with past medical history of CLL, alcoholic, NASH cirrhosis with hepatic encephalopathy, CVA 2020, diabetes mellitus, history of polymicrobial bacteremia secondary to acute cholecystitis status post cholecystectomy admitted with concern for retroperitoneal abscess #Retroperitoneal abscess status post drain placement - Patient admitted with abnormal CT which was done due to weakness and decreased appetite with GI.-9 show on collection 9.5 X12 point centimeter hematoma - Patient had leukocytosis on admission - MRI abdomen showedg 14.6 X2 0.8 cm could be hematoma versus seroma versus abscess. - Patient underwent IR drain placement into retroperitoneal abscess, drained 60 cc purulent fluid which grew Klebsiella pneumonia and Citrobacter.  Patient transition to meropenem. - Found to have retracted drain.  Underwent another drain placement yesterday.  ID engaged for antibiotic recommendations. Recommendations:  -Transition to ertapenem to complete about 3 weeks from last drain placement.  Ultimate antibiotic course pending radiographic and clinical progression. - Noted that follow-up with IR in about 7 days for drain management. - Place PICC - ID will sign off. Communicated plan with primary  OPAT ORDERS:  Diagnosis: Retroperitoneal abscess status post drain  placement  Allergies  Allergen Reactions   Rifaximin  Nausea And Vomiting    Required EMS visit, although patient did not go to hospital   Beta Adrenergic Blockers     Likely WPW.  Use with caution   Calcium  Channel Blockers     Likely WPW.  Use with caution.     Discharge antibiotics to be given via PICC line:  Per pharmacy protocol ertapenem 1gm q 24h   Duration: 2 weeks End Date: 7/2  [facility] Care Per Protocol with [employer] Use: Home health RN for IV administration and teaching, line care and labs.    Labs weekly while on IV antibiotics: _x_ CBC with differential __ BMP **TWICE WEEKLY ON VANCOMYCIN   x__ CMP _x_ CRP _x_ ESR __ Vancomycin  trough TWICE WEEKLY __ CK  __ Please pull PIC at completion of IV antibiotics __ Please leave PIC in place until doctor has seen patient or been notified  Fax weekly labs to [phone]  Clinic Follow Up Appt: 6/30  @ [facility] with Dr. Zelda Hickman    Microbiology:   Antibiotics: Cefepime  6/5 - 6/8 Vancomycin  6/5 - 6/8 Pip-tazo 6/9-present  Cultures: Blood 6/5 NG   HPI: Patrick Kidd is a 78 y.o. male with CLL, alcoholic-cirrhosis with hepatic encephalopathy, thrombocytopenia, portal hypertension, history of SBP in 2020, diabetes mellitus, diabetic neuropathy, prostate cancer, BPH, chronic back pain osteoarthritis presented with abnormal CT abdomen pelvis.  He had 3 to 4 weeks of weakness and decreased appetite.  Seen by GI on 6/5 due to abdominal pain, CT ordered which showed 9.5X 12.7 cm collection right lower abdominal favoring probable evolving hematomas unlikely.  MR abdomen showed large rim-enhancing fluid collection in the low right retroperitoneal overlying  abdominal wall, measuring 14.6 X2 0.8 cm could be hematoma versus seroma versus abscess. IR engagement.  Underwent drain placement into right flank/retroperitoneal abscess.  60 mL purulent fluid aspirated sent for cultures will grew Klebsiella pneumonia and Citrobacter.   This was complicated by drainage retractions, new drain placed on 6/12.  Given follow-up for drain management in about 7 days.  Patient started on meropenem, ID engaged for antibiotic recommendations.  Review of Systems: Review of Systems  All other systems reviewed and are negative.   Past Medical History:  Diagnosis Date   Acrophobia    Alcoholic cirrhosis (HCC) 10/04/2015   Anemia    Arthritis    foot by big toe   BPH associated with nocturia    Cataract    removed both eyes   Cholecystitis 05/2022   tx Abx   Chronic cough    PMH of   CLL (chronic lymphocytic leukemia) (HCC)    COVID-19 12/2018   Diabetes mellitus without complication (HCC)    Diverticulosis 07/03/2010   Colonoscopy.    Duodenal ulcer 2017   Fallen arches    Bilateral   GERD (gastroesophageal reflux disease)    Gout    Granuloma annulare    Hx of adenomatous colonic polyps multiple   Hydrocele 2011   Large septated right hydrocele   Liver cyst    Liver lesion    Nonspecific elevation of levels of transaminase or lactic acid dehydrogenase (LDH)    Obesity    Peripheral neuropathy    Plantar fasciitis    PMH of   Portal hypertension (HCC) 2017   Prostate cancer (HCC)    Right shoulder pain 11/2017   Sleep apnea    no cpap, patient denies   Thiamine  deficiency    Wears reading eyeglasses    WPW (Wolff-Parkinson-White syndrome) 02/05/2019    Social History   Tobacco Use   Smoking status: Former    Current packs/day: 0.00    Average packs/day: 2.0 packs/day for 6.0 years (12.0 ttl pk-yrs)    Types: Cigarettes    Start date: 02/03/1965    Quit date: 02/04/1971    Years since quitting: 52.4    Passive exposure: Never   Smokeless tobacco: Never   Tobacco comments:    smoked age [age], up to 2 ppd  Vaping Use   Vaping status: Never Used  Substance Use Topics   Alcohol use: Not Currently    Comment: No alochol since Christmas Day 2020   Drug use: No    Family History  Problem Relation  Age of Onset   Lung cancer Mother        smoker   Leukemia Father        Acute myelocytic   Diabetes Neg Hx    Stroke Neg Hx    Heart disease Neg Hx    Colon cancer Neg Hx    Colon polyps Neg Hx    Esophageal cancer Neg Hx    Rectal cancer Neg Hx    Stomach cancer Neg Hx    Scheduled Meds:  acetaminophen   650 mg Oral BID   benzonatate   100 mg Oral TID   feeding supplement  1 Container Oral TID BM   gabapentin   300 mg Oral TID   insulin  aspart  0-15 Units Subcutaneous TID WC   insulin  glargine-yfgn  16 Units Subcutaneous Daily   lidocaine   1 patch Transdermal Q24H   multivitamin with minerals  1 tablet Oral Daily   pantoprazole   40 mg Oral BID   polyethylene glycol  17 g Oral Daily   sodium chloride  flush  5 mL Intracatheter Q8H   Continuous Infusions:  meropenem (MERREM) IV     PRN Meds:.guaiFENesin -dextromethorphan , ondansetron  **OR** ondansetron  (ZOFRAN ) IV, senna-docusate, traMADol  Allergies  Allergen Reactions   Rifaximin  Nausea And Vomiting    Required EMS visit, although patient did not go to hospital   Beta Adrenergic Blockers     Likely WPW.  Use with caution   Calcium  Channel Blockers     Likely WPW.  Use with caution.    OBJECTIVE: Blood pressure (!) 142/68, pulse 87, temperature 98.1 F (36.7 C), temperature source Oral, resp. rate 16, height 6' 2 (1.88 m), weight 99.3 kg, SpO2 95%.  Physical Exam Constitutional:      General: He is not in acute distress.    Appearance: He is normal weight. He is not toxic-appearing.  HENT:     Head: Normocephalic and atraumatic.     Right Ear: External ear normal.     Left Ear: External ear normal.     Nose: No congestion or rhinorrhea.     Mouth/Throat:     Mouth: Mucous membranes are moist.     Pharynx: Oropharynx is clear.   Eyes:     Extraocular Movements: Extraocular movements intact.     Conjunctiva/sclera: Conjunctivae normal.     Pupils: Pupils are equal, round, and reactive to light.     Cardiovascular:     Rate and Rhythm: Normal rate and regular rhythm.     Heart sounds: No murmur heard.    No friction rub. No gallop.  Pulmonary:     Effort: Pulmonary effort is normal.     Breath sounds: Normal breath sounds.  Abdominal:     General: Bowel sounds are normal.     Comments: Drain in place   Musculoskeletal:        General: No swelling.     Cervical back: Normal range of motion and neck supple.   Skin:    General: Skin is warm and dry.   Neurological:     General: No focal deficit present.     Mental Status: He is oriented to person, place, and time.   Psychiatric:        Mood and Affect: Mood normal.     Lab Results Lab Results  Component Value Date   WBC 14.7 (H) 07/16/2023   HGB 9.5 (L) 07/16/2023   HCT 30.0 (L) 07/16/2023   MCV 92.6 07/16/2023   PLT 253 07/16/2023    Lab Results  Component Value Date   CREATININE 0.85 07/16/2023   BUN 16 07/16/2023   NA 134 (L) 07/16/2023   K 3.4 (L) 07/16/2023   CL 99 07/16/2023   CO2 24 07/16/2023    Lab Results  Component Value Date   ALT 15 07/15/2023   AST 18 07/15/2023   ALKPHOS 103 07/15/2023   BILITOT 1.0 07/15/2023       Orlie Bjornstad, MD [facility] 07/17/2023, 12:50 PM

## 2023-07-17 NOTE — Progress Notes (Signed)
 Physical Therapy Treatment Patient Details Name: KASON BENAK MRN: [id number] DOB: [birth year] Today's Date: 07/17/2023   History of Present Illness Pt is a 78 yo male presenting to the ED for management of abdominal hematoma, night sweats; fell on his back ~2 weeks ago with CT lumbar spine with moderate central canal stenosis multi-level without acute fx or traumatic sublux. PMH: acrophobia, alcoholic cirrhosis, anemia, BPH, CLL, DM, GERD, gout, peripheral neuropathy, hx of prostate cancer, back surgery,  Wolff-Parkinson-White syndrome    PT Comments   Pt admitted with above diagnosis.  Pt currently with functional limitations due to the deficits listed below (see PT Problem List). Pt in bed when therapist arrived. Pt reported he was feeling weak and knew he needed therapy today. Pt sates his R LE feels weaker. Pt indicated some mild soreness in abdomen, abdominal binder donned, R JP drain. Pt required mod A for supine to sit with instructions for log roll and use of hospital bed, CGA for sit to stand  from EOB to RW, gait tasks in hallway with RW 80 feet with CGA and min cues with one standing therapeutic rest break with pt reporting fatigue with RPE of 6/10. Pt seated in recliner and guided though B LE seated TE. Pt encouraged to sit in recliner and all needs in place. Pt will benefit from continued therapy services in home setting following d/c. Pt will benefit from acute skilled PT to increase their independence and safety with mobility to allow discharge.      If plan is discharge home, recommend the following: A little help with walking and/or transfers;A little help with bathing/dressing/bathroom;Help with stairs or ramp for entrance;Supervision due to cognitive status;Assist for transportation   Can travel by private vehicle        Equipment Recommendations  None recommended by PT    Recommendations for Other Services       Precautions / Restrictions Precautions Precautions:  Fall;Other (comment) Recall of Precautions/Restrictions: Intact Precaution/Restrictions Comments: R hip (pt states this is 2* positioning during surgery); abdominal binder, R JP drain Restrictions Weight Bearing Restrictions Per Provider Order: No     Mobility  Bed Mobility Overal bed mobility: Needs Assistance Bed Mobility: Supine to Sit   Sidelying to sit: Mod assist, HOB elevated, Used rails       General bed mobility comments: HOB slight elevated and pt encouraged to perform log roll and use of bed rail, pt required min A for R LE to EOB and mod A for trunk    Transfers Overall transfer level: Needs assistance Equipment used: Rolling walker (2 wheels) Transfers: Sit to/from Stand Sit to Stand: Contact guard assist           General transfer comment: min cues    Ambulation/Gait Ambulation/Gait assistance: Contact guard assist Gait Distance (Feet): 80 Feet Assistive device: Rolling walker (2 wheels) Gait Pattern/deviations: Trunk flexed, Step-through pattern Gait velocity: min cues for safety, posture and RW management with heavy reliance on B UE support and pt reporting R LE weakness with one standing theraputic rest break with RPE of 6/10     General Gait Details: VCs for proximity to RW, distance limited by pain/fatigue, no loss of balance   Stairs             Wheelchair Mobility     Tilt Bed    Modified Rankin (Stroke Patients Only)       Balance Overall balance assessment: Needs assistance, History of Falls Sitting-balance support: Feet supported,  No upper extremity supported Sitting balance-Leahy Scale: Good     Standing balance support: During functional activity, Reliant on assistive device for balance, Bilateral upper extremity supported Standing balance-Leahy Scale: Fair                              Hotel manager: No apparent difficulties  Cognition Arousal: Alert Behavior During Therapy:  WFL for tasks assessed/performed   PT - Cognitive impairments: No apparent impairments                         Following commands: Intact      Cueing    Exercises General Exercises - Lower Extremity Ankle Circles/Pumps: AROM, Both, 10 reps, Seated Long Arc Quad: AROM, Both, 10 reps, Seated Hip Flexion/Marching: AROM, Both, 10 reps, Seated    General Comments        Pertinent Vitals/Pain Pain Assessment Pain Assessment: Faces Faces Pain Scale: Hurts a little bit Pain Location: R hip Pain Descriptors / Indicators: Sore, Aching Pain Intervention(s): Monitored during session, Limited activity within patient's tolerance, Repositioned    Home Living                          Prior Function            PT Goals (current goals can now be found in the care plan section) Acute Rehab PT Goals Patient Stated Goal: get stronger, be able to walk [place] PT Goal Formulation: With family Potential to Achieve Goals: Good Progress towards PT goals: Progressing toward goals    Frequency    Min 3X/week      PT Plan      Co-evaluation PT/OT/SLP Co-Evaluation/Treatment: Yes Reason for Co-Treatment: Complexity of the patient's impairments (multi-system involvement);Necessary to address cognition/behavior during functional activity;For patient/therapist safety PT goals addressed during session: Mobility/safety with mobility;Balance;Proper use of DME OT goals addressed during session: ADL's and self-care;Proper use of Adaptive equipment and DME      AM-PAC PT 6 Clicks Mobility   Outcome Measure  Help needed turning from your back to your side while in a flat bed without using bedrails?: A Little Help needed moving from lying on your back to sitting on the side of a flat bed without using bedrails?: A Little Help needed moving to and from a bed to a chair (including a wheelchair)?: A Little Help needed standing up from a chair using your arms  (e.g., wheelchair or bedside chair)?: A Little Help needed to walk in hospital room?: A Little Help needed climbing 3-5 steps with a railing? : A Lot 6 Click Score: 17    End of Session Equipment Utilized During Treatment: Gait belt Activity Tolerance: Patient limited by fatigue Patient left: in chair;with call bell/phone within reach;with chair alarm set Nurse Communication: Mobility status PT Visit Diagnosis: Unsteadiness on feet (R26.81);History of falling (Z91.81);Difficulty in walking, not elsewhere classified (R26.2)     Time: 4098-1191 PT Time Calculation (min) (ACUTE ONLY): 27 min  Charges:    $Gait Training: 8-22 mins PT General Charges $$ ACUTE PT VISIT: 1 Visit                     Cary Clarks, PT Acute Rehab    Annalee Kiang 07/17/2023, 12:25 PM

## 2023-07-17 NOTE — Progress Notes (Signed)
 PHARMACY CONSULT NOTE FOR:  OUTPATIENT  PARENTERAL ANTIBIOTIC THERAPY (OPAT)  Indication: retroperitoneal abscess Regimen: ertapenem 1g IV every 24 hours End date: 08/06/23  IV antibiotic discharge orders are pended. To discharging provider:  please sign these orders via discharge navigator,  Select New Orders & click on the button choice - Manage This Unsigned Work.     Thank you for allowing pharmacy to be a part of this patient's care.  Sissy Duff, Arvin Bishop PharmD Candidate 07/17/2023, 1:47 PM

## 2023-07-17 NOTE — Plan of Care (Signed)
  Problem: Nutrition: Goal: Adequate nutrition will be maintained Outcome: Progressing   Problem: Coping: Goal: Level of anxiety will decrease Outcome: Progressing   Problem: Elimination: Goal: Will not experience complications related to bowel motility Outcome: Progressing   Problem: Pain Managment: Goal: General experience of comfort will improve and/or be controlled Outcome: Progressing   Problem: Skin Integrity: Goal: Risk for impaired skin integrity will decrease Outcome: Progressing

## 2023-07-17 NOTE — Discharge Instructions (Signed)
 Interventional Radiology Percutaneous Abscess Drain Placement After Care   This sheet gives you information about how to care for yourself after your procedure. Your health care provider may also give you more specific instructions. Your drain was placed by an interventional radiologist with [facility]. If you have questions or concerns, contact [facility] at [phone].   What is a percutaneous drain?   A drain is a small plastic tube (catheter) that goes into the fluid collection in your body through your skin.   How long will I need the drain?   How long the drain needs to stay in is determined by where the drain is, how much comes out of the drain each day and if you are having any other surgical procedures.   Interventional radiology will determine when it is time to remove the drain. It is important to follow up as directed so that the drain can be removed as soon as it is safe to do so.   What can I expect after the procedure?   After the procedure, it is common to have:   A small amount of bruising and discomfort in the area where the drainage tube (catheter) was placed.   Sleepiness and fatigue. This should go away after the medicines you were given have worn off.   Follow these instructions at home:   Insertion site care   Check your insertion site when you change the bandage. Check for:   More redness, swelling, or pain.   More fluid or blood.   Warmth.   Pus or a bad smell.   When caring for your insertion site:   Wash your hands with soap and water for at least 20 seconds before and after you change your bandage (dressing). If soap and water are not available, use hand sanitizer.   You do not need to change your dressing everyday if it is clean and dry. Change your dressing every 3 days or as needed when it is soiled, wet or becoming dislodged. You will need to change your dressing each time you shower.   Leave stitches (sutures), skin  glue, or adhesive strips in place. These skin closures may need to stay in place for 2 weeks or longer. If adhesive strip edges start to loosen and curl up, you may trim the loose edges. Do not remove adhesive strips completely unless your health care provider tells you to do so.   Catheter care   Flush the catheter once per day with 5 mL of 0.9% normal saline unless you are told otherwise by your healthcare provider. This helps to prevent clogs in the catheter.   To disconnect the drain, turn the clear plastic tube to the left. Attach the saline syringe by placing it on the white end of the drain and turning gently to the right. Once attached gently push the plunger to the 5 mL mark. After you are done flushing, disconnect the syringe by turning to the left and reattach your drainage container   If you have a bulb please be sure the bulb is charged after reconnecting it - to do this pinch the bulb between your thumb and first finger and close the stopper located on the top of the bulb.    Check for fluid leaking from around your catheter (instead of fluid draining through your catheter). This may be a sign that the drain is no longer working correctly.   Write down the following information every time you empty your  bag:   The date and time.   The amount of drainage.   Activity   Rest at home for 1-2 days after your procedure.   For the first 48 hours do not lift anything more than 10 lbs (about a gallon of milk). You may perform moderate activities/exercise. Please avoid strenuous activities during this time.   Avoid any activities which may pull on your drain as this can cause your drain to become dislodged.   If you were given a sedative during the procedure, it can affect you for several hours. Do not drive or operate machinery until your health care provider says that it is safe.   General instructions   For mild pain take over-the-counter medications as needed for pain such as  Tylenol or Advil. If you are experiencing severe pain please call our office as this may indicate an issue with your drain.    If you were prescribed an antibiotic medicine, take it as told by your health care provider. Do not stop using the antibiotic even if you start to feel better.   You may shower 24 hours after the drain is placed. To do this cover the insertion site with a water tight material such as saran wrap and seal the edges with tape, you may also purchase waterproof dressings at your local drug store. Shower as usual and then remove the water tight dressing and any gauze/tape underneath it once you have exited the shower and dried off. Allow the area to air dry or pat dry with a clean towel. Once the skin is completely dry place a new gauze dressing. It is important to keep the site dry at all times to prevent infection.   Do not submerge the drain - this means you cannot take baths, swim, use a hot tub, etc. until the drain is removed.    Do not use any products that contain nicotine or tobacco, such as cigarettes, e-cigarettes, and chewing tobacco. If you need help quitting, ask your health care provider.   Keep all follow-up visits as told by your health care provider. This is important.   Contact a health care provider if:   You have less than 10 mL of drainage a day for 2-3 days in a row, or as directed by your health care provider.   You have any of these signs of infection:   More redness, swelling, or pain around your incision area.   More fluid or blood coming from your incision area.   Warmth coming from your incision area.   Pus or a bad smell coming from your incision area.   You have fluid leaking from around your catheter (instead of through your catheter).   You are unable to flush the drain.   You have a fever or chills.   You have pain that does not get better with medicine.   You have not been contacted to schedule a drain follow up appointment  within 10 days of discharge from the hospital.   Please call [facility] at [phone] with any questions or concerns.   Get help right away if:   Your catheter comes out.   You suddenly stop having drainage from your catheter.   You suddenly have blood in the fluid that is draining from your catheter.   You become dizzy or you faint.   You develop a rash.   You have nausea or vomiting.   You have difficulty breathing or you feel short  of breath.   You develop chest pain.   You have problems with your speech or vision.   You have trouble balancing or moving your arms or legs.   Summary   It is common to have a small amount of bruising and discomfort in the area where the drainage tube (catheter) was placed. You may also have minor discomfort with movement while the drain is in place.   Flush the drain once per day with 5 mL of 0.9% normal saline (unless you were told otherwise by your healthcare provider).    Record the amount of drainage from the bag every time you empty it.   Change the dressing every 3 days or earlier if soiled/wet. Keep the skin dry under the dressing.   You may shower with the drain in place. Do not submerge the drain (no baths, swimming, hot tubs, etc.).   Contact [facility] at [phone] if you have more redness, swelling, or pain around your incision area or if you have pain that does not get better with medicine.   This information is not intended to replace advice given to you by your health care provider. Make sure you discuss any questions you have with your health care provider.   Document Revised: 04/25/2021 Document Reviewed: 01/15/2019   [employer]vier Patient Education  2023 [employer]         Interventional Radiology Drain Record   Empty your drain at least once per day. You may empty it as often as needed. Use this form to write down the amount of fluid that has collected in the drainage container. Bring  this form with you to your follow-up visits. Please call [facility] at [phone] with any questions or concerns prior to your appointment.   Drain #1 location: ___________________   Date __________ Time __________ Amount __________   Date __________ Time __________ Amount __________   Date __________ Time __________ Amount __________   Date __________ Time __________ Amount __________   Date __________ Time __________ Amount __________   Date __________ Time __________ Amount __________   Date __________ Time __________ Amount __________   Date __________ Time __________ Amount __________   Date __________ Time __________ Amount __________   Date __________ Time __________ Amount __________   Date __________ Time __________ Amount __________   Date __________ Time __________ Amount __________   Date __________ Time __________ Amount __________   Date __________ Time __________ Amount __________

## 2023-07-17 NOTE — TOC Progression Note (Incomplete)
 Transition of Care [facility]) - Progression Note    Patient Details  Name: Patrick Kidd MRN: [id number] Date of Birth: March 01, 1945  [facility] CM/SW Contact  Levie Ream, RN Phone Number: 07/17/2023, 4:26 PM  Clinical Narrative:    Notified by Kay Parson that OPAT orders received; Pam will contact pt's wife for education; agency contact info placed in follow up provider section of d/c instructions; pt has HHPT/OT received w/ [facility]; Cory at agency notified and said agency can add [facility]  Expected Discharge Plan: Home w Home Health Services Barriers to Discharge: Continued Medical Work up  Expected Discharge Plan and Services   Discharge Planning Services: CM Consult Post Acute Care Choice: Home Health Living arrangements for the past 2 months: Single Family Home                           HH Arranged: PT, OT [facility] Agency: [facility] Date [facility] Agency Contacted: 07/16/23 Time HH Agency Contacted: 1615 Representative spoke with at [facility] Agency: Randel Buss   Social Determinants of Health (SDOH) Interventions SDOH Screenings   Food Insecurity: No Food Insecurity (07/16/2023)  Housing: Low Risk  (07/16/2023)  Transportation Needs: No Transportation Needs (07/16/2023)  Utilities: Not At Risk (07/16/2023)  Alcohol Screen: Low Risk  (12/02/2021)  Depression (PHQ2-9): Low Risk  (04/28/2023)  Financial Resource Strain: Low Risk  (03/21/2023)   Received from [facility]  Physical Activity: Inactive (12/02/2021)  Social Connections: Moderately Integrated (12/02/2021)  Stress: No Stress Concern Present (12/02/2021)  Tobacco Use: Medium Risk (07/12/2023)    Readmission Risk Interventions    07/16/2023    4:12 PM  Readmission Risk Prevention Plan  Transportation Screening Complete  PCP or Specialist Appt within 5-7 Days Complete  Home Care Screening Complete  Medication Review (RN CM) Complete

## 2023-07-17 NOTE — Progress Notes (Signed)
 Referring Physician(s): Patrick Kidd,L  Supervising Physician: Patrick Kidd  Patient Status:  Patrick Kidd - In-pt  Chief Complaint: Right lower abdominal/flank pain, retroperitoneal abscess, status post drain placement on 07/16/2023   Subjective: Patient feeling a little better today.  Still has some soreness right lower quadrant but denies fever, nausea, vomiting; more alert and talkative today; spouse in room   Allergies: Rifaximin , Beta adrenergic blockers, and Calcium  channel blockers  Medications: Prior to Admission medications   Medication Sig Start Date End Date Taking? Authorizing Provider  ertapenem (INVANZ) IVPB Inject 1 g into the vein daily for 19 days. Indication:  RETROPERITONEAL ABSCESS First Dose: Yes Last Day of Therapy:  08/06/23 Labs - Once weekly:  CBC/D and BMP, Labs - Once weekly: ESR and CRP Method of administration: Mini-Bag Plus / Gravity Method of administration may be changed at the discretion of home infusion pharmacist based upon assessment of the patient and/or caregiver's ability to self-administer the medication ordered. 07/18/23 08/06/23 Yes Patrick Bjornstad, MD  gabapentin  (NEURONTIN ) 300 MG capsule Take 1 capsule (300 mg total) by mouth 3 (three) times daily. Patient taking differently: Take 300-600 mg by mouth See admin instructions. Take 300mg  in the AM and 600mg  in the PM. 05/01/23  Yes Patrick Knuckles, MD  insulin  glargine, 2 Unit Dial , (TOUJEO  MAX SOLOSTAR) 300 UNIT/ML Solostar Pen Inject 12 Units into the skin daily. Via SANOFI pt assistance 05/01/23  Yes Patrick Knuckles, MD  metFORMIN  (GLUCOPHAGE ) 500 MG tablet TAKE 1 TABLET BY MOUTH TWICE A DAY WITH FOOD 04/23/23  Yes Patrick Knuckles, MD  Multiple Vitamins-Minerals (CENTRUM SILVER 50+MEN) TABS Take 1 tablet by mouth daily.   Yes [provider]  pantoprazole  (PROTONIX ) 40 MG tablet TAKE 1 TABLET BY MOUTH 2 TIMES DAILY 30 MINUTES BEFORE MEALS (BREAKFAST AND SUPPER) 06/30/23  Yes Patrick Knuckles, MD   cyclobenzaprine  (FLEXERIL ) 10 MG tablet Take 10 mg by mouth 3 (three) times daily. Patient not taking: Reported on 07/10/2023 07/09/23   [provider]  HYDROcodone -acetaminophen  (NORCO/VICODIN) 5-325 MG tablet Take 1 tablet by mouth every 6 (six) hours as needed. Patient not taking: Reported on 07/10/2023 07/09/23   [provider]  Insulin  Pen Needle (BD PEN NEEDLE MINI ULTRAFINE) 31G X 5 MM MISC INJECT 1 ACT INTO THE SKIN DAILY. USE TO ADMINISTER INSULIN . DX E11.9   Strength: 31G X 5 MM 06/30/23   Patrick Knuckles, MD     Vital Signs: BP 90/75 (BP Location: Left Arm)   Pulse 84   Temp 98.1 F (36.7 C) (Oral)   Resp 16   Ht 6' 2 (1.88 m)   Wt 218 lb 14.7 oz (99.3 kg)   SpO2 100%   BMI 28.11 kg/m   Physical Exam: Awake, alert.  Right lower abdominal drain intact, insertion site okay, mildly tender to palpation, output yesterday 600 cc, today 75 cc turbid bloody fluid; drain irrigated without difficulty  Imaging: US  EKG SITE RITE Result Date: 07/17/2023 If Site Rite image not attached, placement could not be confirmed due to current cardiac rhythm.  CT GUIDED PERITONEAL/RETROPERITONEAL FLUID DRAIN BY PERC CATH Result Date: 07/16/2023 INDICATION: [id number] Retroperitoneal abscess Patrick Kidd) [id number] Briefly, 78 year old male with RIGHT flank retroperitoneal abscess s/p recent drain placement 07/13/2023. Patient is reportedly encephalopathic and pulled out his catheter. Request for replacement EXAM: CT-GUIDED RIGHT FLANK/RETROPERITONEAL ABSCESS DRAINAGE CATHETER PLACEMENT COMPARISON:  IR CT, 07/11/2023.  CT AP, 07/15/2023. MEDICATIONS: The patient is currently admitted to the hospital and receiving intravenous  antibiotics. The antibiotics were administered within an appropriate time frame prior to the initiation of the procedure. ANESTHESIA/SEDATION: Local anesthetic and single agent sedation was employed during this procedure. A total of fentanyl  100 mcg was administered intravenously.  The patient's level of consciousness and vital signs were monitored continuously by radiology nursing throughout the procedure under my direct supervision. CONTRAST:  None FLUOROSCOPY TIME:  CT dose; 985 mGycm COMPLICATIONS: None immediate. PROCEDURE: RADIATION DOSE REDUCTION: This exam was performed according to the departmental dose-optimization program which includes automated exposure control, adjustment of the mA and/or kV according to patient size and/or use of iterative reconstruction technique. Informed written consent was obtained from the patient after a discussion of the risks, benefits and alternatives to treatment. The patient was placed supine on the CT gantry and a pre procedural CT was performed re-demonstrating the known abscess/fluid collection within the RIGHT retroperitoneum. The procedure was planned. A timeout was performed prior to the initiation of the procedure. The RIGHT flank was prepped and draped in the usual sterile fashion. The overlying soft tissues were anesthetized with 1% lidocaine  with epinephrine . Appropriate trajectory was planned with the use of a 22 gauge spinal needle. An 18 gauge trocar needle was advanced into the abscess/fluid collection and a short Amplatz super stiff wire was coiled within the collection. Appropriate positioning was confirmed with a limited CT scan. The tract was serially dilated allowing placement of a 14 Fr drainage catheter. Appropriate positioning was confirmed with a limited postprocedural CT scan. 10 mL of purulent fluid was aspirated. The tube was connected to a bulb suction and sutured in place. A dressing was placed. The patient tolerated the procedure well without immediate post procedural complication. IMPRESSION: Successful CT-guided placement of a 14 Fr drainage catheter into the RIGHT retroperitoneal abscess. RECOMMENDATIONS: The patient will return to Vascular Interventional Radiology (VIR) for routine drainage catheter evaluation and  exchange in 10-14 days. Patrick Largo, MD [facility] Electronically Signed   By: Patrick Kidd M.D.   On: 07/16/2023 15:49   CT ABDOMEN PELVIS W CONTRAST Result Date: 07/16/2023 CLINICAL DATA:  Worsening flank pain post retroperitoneal abscess drainage EXAM: CT ABDOMEN AND PELVIS WITH CONTRAST TECHNIQUE: Multidetector CT imaging of the abdomen and pelvis was performed using the standard protocol following bolus administration of intravenous contrast. RADIATION DOSE REDUCTION: This exam was performed according to the departmental dose-optimization program which includes automated exposure control, adjustment of the mA and/or kV according to patient size and/or use of iterative reconstruction technique. CONTRAST:  OMNIPAQUE  IOHEXOL  300 MG/ML  SOLN COMPARISON:  07/13/2023 and previous FINDINGS: Lower chest: Trace right pleural effusion stable since 07/11/2023. Small volume pericardial fluid. Coronary and aortic calcifications. Some linear scarring/atelectasis in the lung bases right greater than left as before. Hepatobiliary: 6 cm right hepatic cyst stable. No new liver lesion or biliary ductal dilatation. Gallbladder is decompressed or absent. Pancreas: Unremarkable. No pancreatic ductal dilatation or surrounding inflammatory changes. Spleen: Splenomegaly, 16.2 cm craniocaudal length, without focal lesion. Adrenals/Urinary Tract: No adrenal mass. 2 mm peripheral calculus mid right renal collecting system. Symmetric renal contours and enhancement without hydronephrosis. Urinary bladder incompletely distended. Stomach/Bowel: Stomach is nondistended. Small bowel decompressed. Normal appendix. The colon is partially distended, with a few scattered diverticula predominately right-sided; no adjacent inflammatory change. Vascular/Lymphatic: Mild scattered aortoiliac calcified plaque without AAA. Portal vein patent. A few subcentimeter left para-aortic and  aortocaval lymph nodes. No pelvic or mesenteric adenopathy. Reproductive: Multiple metallic seeds about the  prostate. Other: The previously placed pigtail drain catheter has significant retracted, the pigtail projecting in the lateral body wall musculature. Persistent right retroperitoneal/pelvic loculated fluid collection 14.6 cm maximum diameter, only minimally improved from previous. No ascites. No free air. Trace presacral fluid/edema stable. Musculoskeletal: Vertebral endplate spurring at multiple levels in the lower thoracic spine. Mild multilevel spondylitic changes in the lumbar spine. Bilateral hip DJD. IMPRESSION: 1. Significant retraction of right retroperitoneal/pelvic drain catheter, the pigtail projecting in the lateral body wall musculature. Little improvement in right retroperitoneal/pelvic abscess. Findings reviewed with Dr. Darylene Epley. 2. Splenomegaly. 3. Coronary and Aortic Atherosclerosis (ICD10-I70.0). Electronically Signed   By: Nicoletta Barrier M.D.   On: 07/16/2023 08:48   CT GUIDED PERITONEAL/RETROPERITONEAL FLUID DRAIN BY PERC CATH Result Date: 07/13/2023 INDICATION: [id number] Abscess [id number] EXAM: CT-GUIDED RIGHT FLANK/RETROPERITONEAL ABSCESS DRAINAGE CATHETER PLACEMENT COMPARISON:  CT AP and MRI abdomen pelvis, 07/11/2023 MEDICATIONS: The patient is currently admitted to the hospital and receiving intravenous antibiotics. The antibiotics were administered within an appropriate time frame prior to the initiation of the procedure. ANESTHESIA/SEDATION: Moderate (conscious) sedation was employed during this procedure. A total of Versed  2 mg and Fentanyl  100 mcg was administered intravenously. Moderate Sedation Time: 23 minutes. The patient's level of consciousness and vital signs were monitored continuously by radiology nursing throughout the procedure under my direct supervision. CONTRAST:  None FLUOROSCOPY: CT dose; 1274 mGycm COMPLICATIONS: None immediate. PROCEDURE: RADIATION DOSE REDUCTION: This exam  was performed according to the departmental dose-optimization program which includes automated exposure control, adjustment of the mA and/or kV according to patient size and/or use of iterative reconstruction technique. Informed written consent was obtained from the patient after a discussion of the risks, benefits and alternatives to treatment. The patient was placed supine on the CT gantry and a pre procedural CT was performed re-demonstrating the known abscess/fluid collection within the the RIGHT flank. The procedure was planned. A timeout was performed prior to the initiation of the procedure. The RIGHT 5 was prepped and draped in the usual sterile fashion. The overlying soft tissues were anesthetized with 1% lidocaine  with epinephrine . Appropriate trajectory was planned with the use of a 22 gauge spinal needle. An 18 gauge trocar needle was advanced into the abscess/fluid collection and a short Amplatz super stiff wire was coiled within the collection. Appropriate positioning was confirmed with a limited CT scan. The tract was serially dilated allowing placement of a 10 Fr drainage catheter. Appropriate positioning was confirmed with a limited postprocedural CT scan. 60 mL of purulent fluid was aspirated. The tube was connected to a bulb suction and sutured in place. A dressing was placed. The patient tolerated the procedure well without immediate post procedural complication. IMPRESSION: Successful CT-guided placement of a 10 Fr drainage catheter into the RIGHT retroperitoneal abscess with aspiration of 60 mL of purulent fluid. Samples were sent to the laboratory as requested by the ordering clinical team. RECOMMENDATIONS: The patient will return to Vascular Interventional Radiology (VIR) for routine drainage catheter evaluation and exchange in 10-14 days. Patrick Largo, MD [facility] Electronically Signed   By: Patrick Kidd M.D.   On: 07/13/2023 17:28     Labs:  CBC: Recent Labs    07/13/23 0546 07/14/23 0551 07/15/23 1156 07/16/23 0618  WBC 12.5* 11.4* 11.9* 14.7*  HGB 9.1* 9.7* 9.9* 9.5*  HCT 27.0* 28.6* 30.0* 30.0*  PLT 170 173 229 253    COAGS: Recent Labs    12/03/22 1850 12/08/22 0834 07/09/23 0954  07/10/23 0509 07/11/23 0738 07/12/23 0644  INR 1.5*   < > 1.5* 1.4* 1.4* 1.4*  APTT 32  --   --   --   --   --    < > = values in this interval not displayed.    BMP: Recent Labs    07/13/23 0546 07/13/23 1804 07/15/23 1156 07/16/23 0618  NA 130* 133* 134* 134*  K 3.8 3.7 3.5 3.4*  CL 99 100 99 99  CO2 23 22 27 24   GLUCOSE 201* 159* 184* 122*  BUN 19 18 17 16   CALCIUM  8.3* 8.5* 8.6* 8.6*  CREATININE 0.96 0.93 0.81 0.85  GFRNONAA >60 >60 >60 >60    LIVER FUNCTION TESTS: Recent Labs    07/09/23 0954 07/09/23 1733 07/10/23 0509 07/15/23 1156  BILITOT 1.4* 1.4* 1.1 1.0  AST 12 17 14* 18  ALT 10 13 12 15   ALKPHOS 109 103 94 103  PROT 6.5 6.3* 5.8* 5.8*  ALBUMIN  3.3* 2.6* 2.3* 2.1*    Assessment and Plan: Pt with hx CLL, alcoholic and NASH cirrhosis with hepatic encephalopathy, thrombocytopenia, portal HTN, H/o SBP in 2020, DM, peripheral neuropathy, prostate cancer, BPH, chronic back pain , osteoarthritis and right RP abscess/infected hematoma, s/p drain placement 07/13/23; now with drain retraction into surrounding soft tissues/muscle, status post new right retroperitoneal abscess drain placement on 6/12; afebrile, BP a little soft, no new labs today; fluid culture with Bacteroides, Citrobacter and Klebsiella;   Drain Location: RLQ Size: Fr size: 14 Fr Date of placement: 07/16/23  Currently to: Drain collection device: suction bulb 24 hour output:  Output by Drain (mL) 07/15/23 0701 - 07/15/23 1900 07/15/23 1901 - 07/16/23 0700 07/16/23 0701 - 07/16/23 1900 07/16/23 1901 - 07/17/23 0700 07/17/23 0701 - 07/17/23 1630  Closed System Drain  Right RLQ Bulb (JP) 10 Fr.    595 75      Current  examination: Flushes/aspirates easily.  Insertion site unremarkable. Suture in place. Dressed appropriately.   Plan: Continue TID flushes with 5 cc NS. Record output Q shift. Dressing changes QD or PRN if soiled.  Call IR APP or on call IR MD if difficulty flushing or sudden change in drain output.  Repeat imaging/possible drain injection once output < 10 mL/QD (excluding flush material). Consideration for drain removal if output is < 10 mL/QD (excluding flush material), pending discussion with the providing surgical service.  Discharge planning: Please contact IR APP or on call IR MD prior to patient d/c to ensure appropriate follow up plans are in place. Typically patient will follow up with IR clinic 10-14 days post d/c for repeat imaging/possible drain injection. IR scheduler will contact patient with date/time of appointment. Patient will need to flush drain QD with 5 cc NS, record output QD, dressing changes every 2-3 days or earlier if soiled.   IR will continue to follow - please call with questions or concerns.      Electronically Signed: D. Honore Lux, PA-C 07/17/2023, 4:26 PM   I spent a total of 15 Minutes at the the patient's bedside AND on the patient's hospital floor or unit, greater than 50% of which was counseling/coordinating care for right retroperitoneal abscess drain    Patient ID: Patrick Kidd, male   DOB: 1945/07/29, 78 y.o.   MRN: [id number]

## 2023-07-17 NOTE — Progress Notes (Addendum)
 PICC order received. Tyana RN made aware PICC team will assess pt on 6/14. RN reports home health RN saw and wife today.

## 2023-07-18 DIAGNOSIS — R6510 Systemic inflammatory response syndrome (SIRS) of non-infectious origin without acute organ dysfunction: Secondary | ICD-10-CM | POA: Diagnosis not present

## 2023-07-18 LAB — CBC
HCT: 31.7 % — ABNORMAL LOW (ref 39.0–52.0)
Hemoglobin: 10.2 g/dL — ABNORMAL LOW (ref 13.0–17.0)
MCH: 29.9 pg (ref 26.0–34.0)
MCHC: 32.2 g/dL (ref 30.0–36.0)
MCV: 93 fL (ref 80.0–100.0)
Platelets: 293 10*3/uL (ref 150–400)
RBC: 3.41 MIL/uL — ABNORMAL LOW (ref 4.22–5.81)
RDW: 12.2 % (ref 11.5–15.5)
WBC: 8 10*3/uL (ref 4.0–10.5)
nRBC: 0 % (ref 0.0–0.2)

## 2023-07-18 LAB — COMPREHENSIVE METABOLIC PANEL WITH GFR
ALT: 16 U/L (ref 0–44)
AST: 23 U/L (ref 15–41)
Albumin: 2.2 g/dL — ABNORMAL LOW (ref 3.5–5.0)
Alkaline Phosphatase: 121 U/L (ref 38–126)
Anion gap: 9 (ref 5–15)
BUN: 14 mg/dL (ref 8–23)
CO2: 28 mmol/L (ref 22–32)
Calcium: 8.5 mg/dL — ABNORMAL LOW (ref 8.9–10.3)
Chloride: 98 mmol/L (ref 98–111)
Creatinine, Ser: 0.82 mg/dL (ref 0.61–1.24)
GFR, Estimated: >60 mL/min (ref >60)
Glucose, Bld: 131 mg/dL — ABNORMAL HIGH (ref 70–99)
Potassium: 3.7 mmol/L (ref 3.5–5.1)
Sodium: 135 mmol/L (ref 135–145)
Total Bilirubin: 0.9 mg/dL (ref 0.0–1.2)
Total Protein: 6.1 g/dL — ABNORMAL LOW (ref 6.5–8.1)

## 2023-07-18 LAB — GLUCOSE, CAPILLARY
Glucose-Capillary: 134 mg/dL — ABNORMAL HIGH (ref 70–99)
Glucose-Capillary: 106 mg/dL — ABNORMAL HIGH (ref 70–99)
Glucose-Capillary: 186 mg/dL — ABNORMAL HIGH (ref 70–99)
Glucose-Capillary: 166 mg/dL — ABNORMAL HIGH (ref 70–99)

## 2023-07-18 LAB — MAGNESIUM: Magnesium: 1.9 mg/dL (ref 1.7–2.4)

## 2023-07-18 MED ORDER — CHLORHEXIDINE GLUCONATE CLOTH 2 % EX PADS
6.0000 | MEDICATED_PAD | Freq: Every day | CUTANEOUS | Status: DC
  Administered 2023-07-19: 6 via TOPICAL

## 2023-07-18 MED ORDER — SODIUM CHLORIDE 0.9% FLUSH
10.0000 mL | INTRAVENOUS | Status: DC | PRN
  Administered 2023-07-20 (×2): 10 mL

## 2023-07-18 MED ORDER — SODIUM CHLORIDE 0.9% FLUSH
10.0000 mL | Freq: Two times a day (BID) | INTRAVENOUS | Status: DC
  Administered 2023-07-18 – 2023-07-20 (×5): 10 mL

## 2023-07-18 NOTE — TOC Progression Note (Signed)
 Transition of Care [facility]) - Progression Note    Patient Details  Name: Patrick Kidd MRN: [id number] Date of Birth: [birth year]  [facility] CM/SW Contact  Katrine Parody, [place] Phone Number: 07/18/2023, 3:35 PM  Clinical Narrative:     CSW contacted by Oralee Billow of [facility] provider  [facility], They would like to be aware of DC day as they want to ensure RN visits home next day.  Pam also shared the home care education has been provided to spouse.  CSW  to MD advising that [facility] will coordinate DC plan with [facility] providers when ready to ensure follow up visit timely.  TOC to follow.   Expected Discharge Plan: Home w Home Health Services Barriers to Discharge: Continued Medical Work up  Expected Discharge Plan and Services   Discharge Planning Services: CM Consult Post Acute Care Choice: Home Health Living arrangements for the past 2 months: Single Family Home                           HH Arranged: RN, PT, OT [facility] Agency: [facility] Date [facility] Agency Contacted: 07/17/23 Time HH Agency Contacted: [phone] Representative spoke with at [facility] Agency: Randel Buss   Social Determinants of Health (SDOH) Interventions SDOH Screenings   Food Insecurity: No Food Insecurity (07/16/2023)  Housing: Low Risk  (07/16/2023)  Transportation Needs: No Transportation Needs (07/16/2023)  Utilities: Not At Risk (07/16/2023)  Alcohol Screen: Low Risk  (12/02/2021)  Depression (PHQ2-9): Low Risk  (04/28/2023)  Financial Resource Strain: Low Risk  (03/21/2023)   Received from [facility]  Physical Activity: Inactive (12/02/2021)  Social Connections: Moderately Integrated (12/02/2021)  Stress: No Stress Concern Present (12/02/2021)  Tobacco Use: Medium Risk (07/12/2023)    Readmission Risk Interventions    07/16/2023    4:12 PM  Readmission Risk Prevention Plan  Transportation Screening Complete  PCP or Specialist Appt within 5-7 Days Complete  Home Care Screening  Complete  Medication Review (RN CM) Complete

## 2023-07-18 NOTE — Progress Notes (Signed)
 PROGRESS NOTE  [place] JYN:[id number] DOB: 03-02-1945 DOA: 07/09/2023 PCP: [name], MD   LOS: 8 days   Brief narrative:  Patient is a 78 years old male with CLL, alcoholic and NASH cirrhosis with hepatic encephalopathy, thrombocytopenia, portal HTN,  H/o SBP in 2020, DM, peripheral neuropathy, prostate cancer, BPH, chronic back pain and osteoarthritis presented to the hospital abnormal CT of the abd/pelvis.  He reported fever 3 to 4 weeks ago with increasing weakness and decreased appetite and a fall 3 weeks back.  He had seen GI on 07/09/2023 due to abdominal pain and lack of appetite.  CT scan of the abdomen and pelvis done showed 9.5 x 12.7 collection in right lower abdomen favoring probable evolving hematoma with splenomegaly.  Patient was then admitted to the hospital for further evaluation and treatment. During hospitalization, fevers did not resolve despite IV antibiotics and blood cultures were negative.  IR drain placed on 6/9.  Hospital course has been complicated by confusion and agitation exacerbated by the narcotics he required for his pain.     Assessment/Plan: Principal Problem:   SIRS (systemic inflammatory response syndrome) (HCC) Active Problems:   Type 2 diabetes mellitus with complication, with long-term current use of insulin  (HCC)   Essential hypertension   WPW (Wolff-Parkinson-White syndrome)   Chronic lymphocytic leukemia (HCC)   BPH associated with nocturia   Gout, unspecified   OSA (obstructive sleep apnea)   Portal hypertension (HCC)   Thrombocytopenia (HCC)   Hematoma  Sepsis secondary to retroperitoneal hematoma infection. Patient was initially on vancomycin  and cefepime .  Blood cultures negative so far in 5 days.  Status post IR intervention and drainage of 60 cc of purulent fluid.  Patient had drain tube displaced so had to be reinserted by interventional radiology.   Fluid culture now with Klebsiella pneumonia, Citrobacter.  Spoke with ID and  at this time plan is PICC line placement and ertapenem for 3 weeks.  Pending PICC line placement.   Acute toxic encephalopathy  Likely secondary to narcotics and infection.  Continue Tylenol  and tramadol  for pain management.  Alert awake and oriented at this time.  Resolved at this time.  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 Elevated INR Intermittent thrombocytopenia Platelets have improved.  No evidence of bleeding.     Type 2 diabetes mellitus with complication, with long-term current use of insulin , uncontrolled Patient is on metformin , Toujeo  12 units daily but according to his wife, he does not always adhere to a diabetic diet.  Continue with Semglee  and NovoLog .  Latest POC glucose of 134.  Continue diabetic diet    Chronic lymphocytic leukemia  Last evaluated in October 2024-did not need treatment at that time  Hypokalemia.  Latest potassium of 3.7.  Hypomagnesemia.  Improved after replacement.  Latest magnesium  1.9.  Debility deconditioning.  Seen by PT OT and recommend home health on discharge..    DVT prophylaxis: Place and maintain sequential compression device Start: 07/09/23 2027   Disposition: Likely home with home health in 1 to 2 days  Status is: Inpatient Remains inpatient appropriate because: IV antibiotic, abdominal drain, pending clinical improvement, outpatient antibiotic set up.  Code Status:     Code Status: Full Code  Family Communication: Spoke with the patient's wife at bedside on 07/17/2023  Consultants: Interventional radiology ID  Procedures: CT-guided retroperitoneal abscess drainage catheter placement on 07/13/2023 by interventional radiology CT-guided retroperitoneal drainage catheter placement on 07/16/2023.  Anti-infectives:  Ertapenem 6/13>  Anti-infectives (From admission,  onward)    Start     Dose/Rate Route Frequency Ordered Stop   07/18/23 0000  ertapenem [facility]) IVPB        1 g Intravenous Every 24 hours 07/17/23 1513  08/06/23 2359   07/17/23 1445  ertapenem (INVANZ) 1 g in sodium chloride  0.9 % 100 mL IVPB        1 g 200 mL/hr over 30 Minutes Intravenous Every 24 hours 07/17/23 1347     07/17/23 1400  meropenem (MERREM) 1 g in sodium chloride  0.9 % 100 mL IVPB  Status:  Discontinued        1 g 200 mL/hr over 30 Minutes Intravenous Every 8 hours 07/17/23 1004 07/17/23 1347   07/13/23 1230  piperacillin -tazobactam (ZOSYN ) IVPB 3.375 g  Status:  Discontinued        3.375 g 12.5 mL/hr over 240 Minutes Intravenous Every 8 hours 07/13/23 0814 07/17/23 1004   07/10/23 1700  vancomycin  (VANCOREADY) IVPB 2000 mg/400 mL  Status:  Discontinued        2,000 mg 200 mL/hr over 120 Minutes Intravenous Every 24 hours 07/10/23 0017 07/13/23 0810   07/10/23 0400  ceFEPIme  (MAXIPIME ) 2 g in sodium chloride  0.9 % 100 mL IVPB  Status:  Discontinued        2 g 200 mL/hr over 30 Minutes Intravenous Every 8 hours 07/10/23 0017 07/13/23 0809   07/09/23 1845  vancomycin  (VANCOREADY) IVPB 2000 mg/400 mL        2,000 mg 200 mL/hr over 120 Minutes Intravenous  Once 07/09/23 1836 07/09/23 2152   07/09/23 1845  ceFEPIme  (MAXIPIME ) 2 g in sodium chloride  0.9 % 100 mL IVPB        2 g 200 mL/hr over 30 Minutes Intravenous  Once 07/09/23 1836 07/09/23 2024       Subjective: Today, patient was seen and examined at bedside.  Denies any fevers, chills or rigor.  Denies any nausea vomiting.   Objective: Vitals:   07/17/23 2129 07/18/23 0541  BP: (!) 155/74 (!) 140/69  Pulse: 83 82  Resp: 18 16  Temp: 98 F (36.7 C) (!) 97.5 F (36.4 C)  SpO2: 96% 100%    Intake/Output Summary (Last 24 hours) at 07/18/2023 1114 Last data filed at 07/18/2023 4098 Gross per 24 hour  Intake 125 ml  Output 620 ml  Net -495 ml   Filed Weights   07/09/23 1810  Weight: 99.3 kg   Body mass index is 28.11 kg/m.   Physical Exam:  General:  Average built, not in obvious distress HENT:   No scleral pallor or icterus noted. Oral mucosa is  moist.  Chest:  Clear breath sounds.   No crackles or wheezes.  CVS: S1 &S2 heard. No murmur.  Regular rate and rhythm. Abdomen: Soft, nontender, nondistended.  Bowel sounds are heard.  Abdominal binder in place with abdominal drain. Extremities: No cyanosis, clubbing or edema.  Peripheral pulses are palpable. Psych: Alert, awake and oriented, normal mood CNS:  No cranial nerve deficits.  Power equal in all extremities.   Skin: Warm and dry.  No rashes noted.  Data Review: I have personally reviewed the following laboratory data and studies,  CBC: Recent Labs  Lab 07/13/23 0546 07/14/23 0551 07/15/23 1156 07/16/23 0618 07/18/23 0611  WBC 12.5* 11.4* 11.9* 14.7* 8.0  HGB 9.1* 9.7* 9.9* 9.5* 10.2*  HCT 27.0* 28.6* 30.0* 30.0* 31.7*  MCV 90.3 89.4 90.9 92.6 93.0  PLT 170 173 229 253 293  Basic Metabolic Panel: Recent Labs  Lab 07/13/23 0546 07/13/23 1804 07/15/23 1156 07/16/23 0618 07/18/23 0611  NA 130* 133* 134* 134* 135  K 3.8 3.7 3.5 3.4* 3.7  CL 99 100 99 99 98  CO2 23 22 27 24 28   GLUCOSE 201* 159* 184* 122* 131*  BUN 19 18 17 16 14   CREATININE 0.96 0.93 0.81 0.85 0.82  CALCIUM  8.3* 8.5* 8.6* 8.6* 8.5*  MG  --   --  1.5* 2.1 1.9   Liver Function Tests: Recent Labs  Lab 07/15/23 1156 07/18/23 0611  AST 18 23  ALT 15 16  ALKPHOS 103 121  BILITOT 1.0 0.9  PROT 5.8* 6.1*  ALBUMIN  2.1* 2.2*   No results for input(s): LIPASE, AMYLASE in the last 168 hours.  Recent Labs  Lab 07/13/23 1417  AMMONIA 19   Cardiac Enzymes: No results for input(s): CKTOTAL, CKMB, CKMBINDEX, TROPONINI in the last 168 hours. BNP (last 3 results) Recent Labs    12/08/22 0452  BNP 623.7*    ProBNP (last 3 results) No results for input(s): PROBNP in the last 8760 hours.  CBG: Recent Labs  Lab 07/17/23 0737 07/17/23 1124 07/17/23 1620 07/17/23 2129 07/18/23 0741  GLUCAP 175* 179* 138* 150* 134*   Recent Results (from the past 240 hours)  Blood  culture (routine x 2)     Status: None   Collection Time: 07/09/23  6:25 PM   Specimen: BLOOD RIGHT FOREARM  Result Value Ref Range Status   Specimen Description   Final    BLOOD RIGHT FOREARM Performed at [facility], [address], [place], [place] [address]    Special Requests   Final    BOTTLES DRAWN AEROBIC AND ANAEROBIC Blood Culture results may not be optimal due to an inadequate volume of blood received in culture bottles Performed at [facility], [address], [place], [place] [address]    Culture   Final    NO GROWTH 5 DAYS Performed at [facility], [address], [place], [place] [address]    Report Status 07/14/2023 FINAL  Final  Blood culture (routine x 2)     Status: None   Collection Time: 07/09/23  6:26 PM   Specimen: BLOOD  Result Value Ref Range Status   Specimen Description   Final    BLOOD LEFT ANTECUBITAL Performed at [facility], [address] [place], [place] [address]    Special Requests   Final    BOTTLES DRAWN AEROBIC AND ANAEROBIC Blood Culture results may not be optimal due to an inadequate volume of blood received in culture bottles Performed at [facility], [address] [place], [place] [address]    Culture   Final    NO GROWTH 5 DAYS Performed at [facility], [address], [place], [place] [address]    Report Status 07/14/2023 FINAL  Final  Resp panel by RT-PCR (RSV, Flu A&B, Covid) Anterior Nasal Swab     Status: None   Collection Time: 07/10/23  8:42 AM   Specimen: Anterior Nasal Swab  Result Value Ref Range Status   SARS Coronavirus 2 by RT PCR NEGATIVE NEGATIVE Final    Comment: (NOTE) SARS-CoV-2 target nucleic acids are NOT DETECTED.  The SARS-CoV-2 RNA is generally detectable in upper respiratory specimens during the acute phase of infection. The lowest concentration of SARS-CoV-2 viral copies this assay can detect is 138 copies/mL. A negative  result does not preclude SARS-Cov-2 infection and should not be used as the sole basis for treatment or other patient management decisions. A negative result may occur with  improper specimen collection/handling, submission of specimen other than nasopharyngeal swab, presence of viral mutation(s) within the areas targeted by this assay, and inadequate number of viral copies(<138 copies/mL). A negative result must be combined with clinical observations, patient history, and epidemiological information. The expected result is Negative.  Fact Sheet for Patients:  BloggerCourse.com  Fact Sheet for Healthcare Providers:  SeriousBroker.it  This test is no t yet approved or cleared by the [place]  FDA and  has been authorized for detection and/or diagnosis of SARS-CoV-2 by FDA under an Emergency Use Authorization (EUA). This EUA will remain  in effect (meaning this test can be used) for the duration of the COVID-19 declaration under Section 564(b)(1) of the Act, 21 U.S.C.section 360bbb-3(b)(1), unless the authorization is terminated  or revoked sooner.       Influenza A by PCR NEGATIVE NEGATIVE Final   Influenza B by PCR NEGATIVE NEGATIVE Final    Comment: (NOTE) The Xpert Xpress SARS-CoV-2/FLU/RSV plus assay is intended as an aid in the diagnosis of influenza from Nasopharyngeal swab specimens and should not be used as a sole basis for treatment. Nasal washings and aspirates are unacceptable for Xpert Xpress SARS-CoV-2/FLU/RSV testing.  Fact Sheet for Patients: BloggerCourse.com  Fact Sheet for Healthcare Providers: SeriousBroker.it  This test is not yet approved or cleared by the [place]  FDA and has been authorized for detection and/or diagnosis of SARS-CoV-2 by FDA under an Emergency Use Authorization (EUA). This EUA will remain in effect (meaning this test can be used)  for the duration of the COVID-19 declaration under Section 564(b)(1) of the Act, 21 U.S.C. section 360bbb-3(b)(1), unless the authorization is terminated or revoked.     Resp Syncytial Virus by PCR NEGATIVE NEGATIVE Final    Comment: (NOTE) Fact Sheet for Patients: BloggerCourse.com  Fact Sheet for Healthcare Providers: SeriousBroker.it  This test is not yet approved or cleared by the [place]  FDA and has been authorized for detection and/or diagnosis of SARS-CoV-2 by FDA under an Emergency Use Authorization (EUA). This EUA will remain in effect (meaning this test can be used) for the duration of the COVID-19 declaration under Section 564(b)(1) of the Act, 21 U.S.C. section 360bbb-3(b)(1), unless the authorization is terminated or revoked.  Performed at [facility], [address] [place], [place] [address]   Respiratory (~20 pathogens) panel by PCR     Status: None   Collection Time: 07/10/23  8:42 AM   Specimen: Nasopharyngeal Swab; Respiratory  Result Value Ref Range Status   Adenovirus NOT DETECTED NOT DETECTED Final   Coronavirus 229E NOT DETECTED NOT DETECTED Final    Comment: (NOTE) The Coronavirus on the Respiratory Panel, DOES NOT test for the novel  Coronavirus (2019 nCoV)    Coronavirus HKU1 NOT DETECTED NOT DETECTED Final   Coronavirus NL63 NOT DETECTED NOT DETECTED Final   Coronavirus OC43 NOT DETECTED NOT DETECTED Final   Metapneumovirus NOT DETECTED NOT DETECTED Final   Rhinovirus / Enterovirus NOT DETECTED NOT DETECTED Final   Influenza A NOT DETECTED NOT DETECTED Final   Influenza B NOT DETECTED NOT DETECTED Final   Parainfluenza Virus 1 NOT DETECTED NOT DETECTED Final   Parainfluenza Virus 2 NOT DETECTED NOT DETECTED Final   Parainfluenza Virus 3 NOT DETECTED NOT DETECTED Final   Parainfluenza Virus 4 NOT DETECTED NOT DETECTED  Final   Respiratory Syncytial Virus NOT DETECTED NOT  DETECTED Final   Bordetella pertussis NOT DETECTED NOT DETECTED Final   Bordetella Parapertussis NOT DETECTED NOT DETECTED Final   Chlamydophila pneumoniae NOT DETECTED NOT DETECTED Final   Mycoplasma pneumoniae NOT DETECTED NOT DETECTED Final    Comment: Performed at [facility], [address], [place], [place] [address]  Aerobic/Anaerobic Culture w Gram Stain (surgical/deep wound)     Status: None   Collection Time: 07/13/23  4:17 PM   Specimen: Abscess  Result Value Ref Range Status   Specimen Description   Final    ABSCESS Performed at [facility], [address] [place], [place] [address]    Special Requests   Final    NONE Performed at [facility], [address], [place], [place] [address]    Gram Stain   Final    ABUNDANT WBC PRESENT, PREDOMINANTLY PMN RARE GRAM NEGATIVE RODS    Culture   Final    MODERATE KLEBSIELLA PNEUMONIAE MODERATE CITROBACTER SPECIES ABUNDANT BACTEROIDES OVATUS BETA LACTAMASE POSITIVE Performed at [facility], [address], [place], [place] [address]    Report Status 07/17/2023 FINAL  Final   Organism ID, Bacteria KLEBSIELLA PNEUMONIAE  Final   Organism ID, Bacteria CITROBACTER SPECIES  Final      Susceptibility   Citrobacter species - MIC*    CEFEPIME  <=0.12 SENSITIVE Sensitive     CEFTAZIDIME >=64 RESISTANT Resistant     CEFTRIAXONE  >=64 RESISTANT Resistant     CIPROFLOXACIN  >=4 RESISTANT Resistant     GENTAMICIN <=1 SENSITIVE Sensitive     IMIPENEM 0.5 SENSITIVE Sensitive     TRIMETH /SULFA  >=320 RESISTANT Resistant     PIP/TAZO 16 SENSITIVE Sensitive ug/mL    * MODERATE CITROBACTER SPECIES   Klebsiella pneumoniae - MIC*    AMPICILLIN  >=32 RESISTANT Resistant     CEFEPIME  <=0.12 SENSITIVE Sensitive     CEFTAZIDIME <=1 SENSITIVE Sensitive     CEFTRIAXONE  <=0.25 SENSITIVE Sensitive     CIPROFLOXACIN  <=0.25 SENSITIVE Sensitive     GENTAMICIN <=1 SENSITIVE Sensitive     IMIPENEM  <=0.25 SENSITIVE Sensitive     TRIMETH /SULFA  <=20 SENSITIVE Sensitive     AMPICILLIN /SULBACTAM 8 SENSITIVE Sensitive     PIP/TAZO <=4 SENSITIVE Sensitive ug/mL    * MODERATE KLEBSIELLA PNEUMONIAE     Studies: US  EKG SITE RITE Result Date: 07/17/2023 If Site Rite image not attached, placement could not be confirmed due to current cardiac rhythm.  CT GUIDED PERITONEAL/RETROPERITONEAL FLUID DRAIN BY PERC CATH Result Date: 07/16/2023 INDICATION: [id number] Retroperitoneal abscess [facility]) [id number] Briefly, 78 year old male with RIGHT flank retroperitoneal abscess s/p recent drain placement 07/13/2023. Patient is reportedly encephalopathic and pulled out his catheter. Request for replacement EXAM: CT-GUIDED RIGHT FLANK/RETROPERITONEAL ABSCESS DRAINAGE CATHETER PLACEMENT COMPARISON:  IR CT, 07/11/2023.  CT AP, 07/15/2023. MEDICATIONS: The patient is currently admitted to the hospital and receiving intravenous antibiotics. The antibiotics were administered within an appropriate time frame prior to the initiation of the procedure. ANESTHESIA/SEDATION: Local anesthetic and single agent sedation was employed during this procedure. A total of fentanyl  100 mcg was administered intravenously. The patient's level of consciousness and vital signs were monitored continuously by radiology nursing throughout the procedure under my direct supervision. CONTRAST:  None FLUOROSCOPY TIME:  CT dose; 985 mGycm COMPLICATIONS: None immediate. PROCEDURE: RADIATION DOSE REDUCTION: This exam was performed according to the departmental dose-optimization program which includes automated exposure control, adjustment of the mA  and/or kV according to patient size and/or use of iterative reconstruction technique. Informed written consent was obtained from the patient after a discussion of the risks, benefits and alternatives to treatment. The patient was placed supine on the CT gantry and a pre procedural CT was performed re-demonstrating the known  abscess/fluid collection within the RIGHT retroperitoneum. The procedure was planned. A timeout was performed prior to the initiation of the procedure. The RIGHT flank was prepped and draped in the usual sterile fashion. The overlying soft tissues were anesthetized with 1% lidocaine  with epinephrine . Appropriate trajectory was planned with the use of a 22 gauge spinal needle. An 18 gauge trocar needle was advanced into the abscess/fluid collection and a short Amplatz super stiff wire was coiled within the collection. Appropriate positioning was confirmed with a limited CT scan. The tract was serially dilated allowing placement of a 14 Fr drainage catheter. Appropriate positioning was confirmed with a limited postprocedural CT scan. 10 mL of purulent fluid was aspirated. The tube was connected to a bulb suction and sutured in place. A dressing was placed. The patient tolerated the procedure well without immediate post procedural complication. IMPRESSION: Successful CT-guided placement of a 14 Fr drainage catheter into the RIGHT retroperitoneal abscess. RECOMMENDATIONS: The patient will return to Vascular Interventional Radiology (VIR) for routine drainage catheter evaluation and exchange in 10-14 days. Art Largo, MD [facility] Radiology Electronically Signed   By: Art Largo M.D.   On: 07/16/2023 15:49      Rosena Conradi, MD  [facility] 07/18/2023  If 7PM-7AM, please contact night-coverage

## 2023-07-18 NOTE — Progress Notes (Signed)
 Peripherally Inserted Central Catheter Placement  The IV Nurse has discussed with the patient and/or persons authorized to consent for the patient, the purpose of this procedure and the potential benefits and risks involved with this procedure.  The benefits include less needle sticks, lab draws from the catheter, and the patient may be discharged home with the catheter. Risks include, but not limited to, infection, bleeding, blood clot (thrombus formation), and puncture of an artery; nerve damage and irregular heartbeat and possibility to perform a PICC exchange if needed/ordered by physician.  Alternatives to this procedure were also discussed.  Bard Power PICC patient education guide, fact sheet on infection prevention and patient information card has been provided to patient /or left at bedside. Telephone consent obtained from Norris wife.   PICC Placement Documentation  PICC Single Lumen 07/18/23 Right Basilic 46 cm 0 cm (Active)  Indication for Insertion or Continuance of Line Home intravenous therapies (PICC only) 07/18/23 1114  Exposed Catheter (cm) 0 cm 07/18/23 1114  Site Assessment Clean, Dry, Intact 07/18/23 1114  Line Status Flushed;Saline locked;Blood return noted 07/18/23 1114  Dressing Type Transparent;Securing device 07/18/23 1114  Dressing Status Antimicrobial disc/dressing in place;Clean, Dry, Intact 07/18/23 1114  Line Care Connections checked and tightened 07/18/23 1114  Line Adjustment (NICU/IV Team Only) No 07/18/23 1114  Dressing Intervention New dressing;Adhesive placed at insertion site (IV team only) 07/18/23 1114  Dressing Change Due 07/25/23 07/18/23 1114       Craige Patel Haywood Lisle 07/18/2023, 11:15 AM

## 2023-07-18 NOTE — Plan of Care (Signed)

## 2023-07-19 DIAGNOSIS — R6510 Systemic inflammatory response syndrome (SIRS) of non-infectious origin without acute organ dysfunction: Secondary | ICD-10-CM | POA: Diagnosis not present

## 2023-07-19 LAB — GLUCOSE, CAPILLARY
Glucose-Capillary: 133 mg/dL — ABNORMAL HIGH (ref 70–99)
Glucose-Capillary: 120 mg/dL — ABNORMAL HIGH (ref 70–99)
Glucose-Capillary: 183 mg/dL — ABNORMAL HIGH (ref 70–99)
Glucose-Capillary: 189 mg/dL — ABNORMAL HIGH (ref 70–99)

## 2023-07-19 NOTE — Progress Notes (Signed)
 PROGRESS NOTE  ATUL DELUCIA ZOX:[id number] DOB: [birth year] DOA: 07/09/2023 PCP: [name], MD   LOS: 9 days   Brief narrative:  Patient is a 78 years old male with CLL, alcoholic and NASH cirrhosis with hepatic encephalopathy, thrombocytopenia, portal HTN,  H/o SBP in 2020, DM, peripheral neuropathy, prostate cancer, BPH, chronic back pain and osteoarthritis presented to the hospital abnormal CT of the abd/pelvis.  He reported fever 3 to 4 weeks ago with increasing weakness and decreased appetite and a fall 3 weeks back.  He had seen GI on 07/09/2023 due to abdominal pain and lack of appetite.  CT scan of the abdomen and pelvis done showed 9.5 x 12.7 collection in right lower abdomen favoring probable evolving hematoma with splenomegaly.  Patient was then admitted to the hospital for further evaluation and treatment. During hospitalization, fevers did not resolve despite IV antibiotics and blood cultures were negative.  IR drain placed on 6/9.  Hospital course was complicated by confusion and agitation exacerbated by the narcotics he required for his pain.  At this time patient has remained stable.     Assessment/Plan: Principal Problem:   SIRS (systemic inflammatory response syndrome) (HCC) Active Problems:   Type 2 diabetes mellitus with complication, with long-term current use of insulin  (HCC)   Essential hypertension   WPW (Wolff-Parkinson-White syndrome)   Chronic lymphocytic leukemia (HCC)   BPH associated with nocturia   Gout, unspecified   OSA (obstructive sleep apnea)   Portal hypertension (HCC)   Thrombocytopenia (HCC)   Hematoma  Sepsis secondary to retroperitoneal hematoma infection. Patient was initially on vancomycin  and cefepime .  Blood cultures negative so far in 5 days.  Status post IR intervention and drainage of 60 cc of purulent fluid.  Patient had drain tube displaced so had to be reinserted by interventional radiology.   Fluid culture now with Klebsiella  pneumonia, Citrobacter.  Infectious disease was consulted and at this time plan is IV ertapenem for 3 weeks.  Status post PICC line placement.  Will plan for IV antibiotic on discharge.   Acute toxic encephalopathy  Likely secondary to narcotics and infection.  Continue Tylenol  and tramadol  for pain management.  Alert awake and oriented at this time.  Resolved at this time.  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 Elevated INR Intermittent thrombocytopenia Platelets have improved.  No evidence of bleeding.  Latest platelet of 293     Type 2 diabetes mellitus with complication, with long-term current use of insulin , uncontrolled Patient is on metformin , Toujeo  12 units daily but according to his wife, he does not always adhere to a diabetic diet.  Continue with Semglee  and NovoLog .  Latest POC glucose of 133.  Continue diabetic diet    Chronic lymphocytic leukemia  Last evaluated in October 2024-did not need treatment at that time  Hypokalemia.  Latest potassium of 3.7.  Hypomagnesemia.  Improved after replacement.  Latest magnesium  1.9.  Debility deconditioning.  Seen by PT OT and recommend home health on discharge..    DVT prophylaxis: Place and maintain sequential compression device Start: 07/09/23 2027   Disposition: Likely home with home health on 07/20/2023  Status is: Inpatient Remains inpatient appropriate because: IV antibiotic, abdominal drain, outpatient antibiotic set up.  Code Status:     Code Status: Full Code  Family Communication: Spoke with the patient's wife at bedside on 07/17/2023  Consultants: Interventional radiology ID  Procedures: CT-guided retroperitoneal abscess drainage catheter placement on 07/13/2023 by interventional radiology CT-guided retroperitoneal drainage  catheter placement on 07/16/2023.  Anti-infectives:  Ertapenem 6/13>  Anti-infectives (From admission, onward)    Start     Dose/Rate Route Frequency Ordered Stop   07/18/23 0000   ertapenem [facility]) IVPB        1 g Intravenous Every 24 hours 07/17/23 1513 08/06/23 2359   07/17/23 1445  ertapenem (INVANZ) 1 g in sodium chloride  0.9 % 100 mL IVPB        1 g 200 mL/hr over 30 Minutes Intravenous Every 24 hours 07/17/23 1347     07/17/23 1400  meropenem (MERREM) 1 g in sodium chloride  0.9 % 100 mL IVPB  Status:  Discontinued        1 g 200 mL/hr over 30 Minutes Intravenous Every 8 hours 07/17/23 1004 07/17/23 1347   07/13/23 1230  piperacillin -tazobactam (ZOSYN ) IVPB 3.375 g  Status:  Discontinued        3.375 g 12.5 mL/hr over 240 Minutes Intravenous Every 8 hours 07/13/23 0814 07/17/23 1004   07/10/23 1700  vancomycin  (VANCOREADY) IVPB 2000 mg/400 mL  Status:  Discontinued        2,000 mg 200 mL/hr over 120 Minutes Intravenous Every 24 hours 07/10/23 0017 07/13/23 0810   07/10/23 0400  ceFEPIme  (MAXIPIME ) 2 g in sodium chloride  0.9 % 100 mL IVPB  Status:  Discontinued        2 g 200 mL/hr over 30 Minutes Intravenous Every 8 hours 07/10/23 0017 07/13/23 0809   07/09/23 1845  vancomycin  (VANCOREADY) IVPB 2000 mg/400 mL        2,000 mg 200 mL/hr over 120 Minutes Intravenous  Once 07/09/23 1836 07/09/23 2152   07/09/23 1845  ceFEPIme  (MAXIPIME ) 2 g in sodium chloride  0.9 % 100 mL IVPB        2 g 200 mL/hr over 30 Minutes Intravenous  Once 07/09/23 1836 07/09/23 2024       Subjective: Today, patient was seen and examined at bedside.  Patient denies any nausea vomiting fever chills or rigor.  Denies overt pain.  Denies any shortness of breath dyspnea.  Has had bowel movement.    Objective: Vitals:   07/18/23 2112 07/19/23 0516  BP: (!) 129/56 133/69  Pulse: 87 74  Resp: (!) 22 16  Temp: 98.4 F (36.9 C) 97.8 F (36.6 C)  SpO2: 95% 95%    Intake/Output Summary (Last 24 hours) at 07/19/2023 1017 Last data filed at 07/19/2023 0900 Gross per 24 hour  Intake 505 ml  Output 737 ml  Net -232 ml   Filed Weights   07/09/23 1810 07/18/23 2112  Weight: 99.3 kg 97  kg   Body mass index is 27.46 kg/m.   Physical Exam:  General:  Average built, not in obvious distress HENT:   No scleral pallor or icterus noted. Oral mucosa is moist.  Chest:  Clear breath sounds.   No crackles or wheezes.  CVS: S1 &S2 heard. No murmur.  Regular rate and rhythm. Abdomen: Soft, nontender, nondistended.  Bowel sounds are heard.  Abdominal binder in place with abdominal drain. Extremities: No cyanosis, clubbing or edema.  Peripheral pulses are palpable. Psych: Alert, awake and oriented, normal mood CNS:  No cranial nerve deficits.  Power equal in all extremities.   Skin: Warm and dry.  No rashes noted.  Data Review: I have personally reviewed the following laboratory data and studies,  CBC: Recent Labs  Lab 07/13/23 0546 07/14/23 0551 07/15/23 1156 07/16/23 0618 07/18/23 0611  WBC 12.5* 11.4* 11.9*  14.7* 8.0  HGB 9.1* 9.7* 9.9* 9.5* 10.2*  HCT 27.0* 28.6* 30.0* 30.0* 31.7*  MCV 90.3 89.4 90.9 92.6 93.0  PLT 170 173 229 253 293   Basic Metabolic Panel: Recent Labs  Lab 07/13/23 0546 07/13/23 1804 07/15/23 1156 07/16/23 0618 07/18/23 0611  NA 130* 133* 134* 134* 135  K 3.8 3.7 3.5 3.4* 3.7  CL 99 100 99 99 98  CO2 23 22 27 24 28   GLUCOSE 201* 159* 184* 122* 131*  BUN 19 18 17 16 14   CREATININE 0.96 0.93 0.81 0.85 0.82  CALCIUM  8.3* 8.5* 8.6* 8.6* 8.5*  MG  --   --  1.5* 2.1 1.9   Liver Function Tests: Recent Labs  Lab 07/15/23 1156 07/18/23 0611  AST 18 23  ALT 15 16  ALKPHOS 103 121  BILITOT 1.0 0.9  PROT 5.8* 6.1*  ALBUMIN  2.1* 2.2*   No results for input(s): LIPASE, AMYLASE in the last 168 hours.  Recent Labs  Lab 07/13/23 1417  AMMONIA 19   Cardiac Enzymes: No results for input(s): CKTOTAL, CKMB, CKMBINDEX, TROPONINI in the last 168 hours. BNP (last 3 results) Recent Labs    12/08/22 0452  BNP 623.7*    ProBNP (last 3 results) No results for input(s): PROBNP in the last 8760 hours.  CBG: Recent Labs   Lab 07/18/23 0741 07/18/23 1120 07/18/23 1624 07/18/23 2120 07/19/23 0717  GLUCAP 134* 106* 186* 166* 133*   Recent Results (from the past 240 hours)  Blood culture (routine x 2)     Status: None   Collection Time: 07/09/23  6:25 PM   Specimen: BLOOD RIGHT FOREARM  Result Value Ref Range Status   Specimen Description   Final    BLOOD RIGHT FOREARM Performed at [facility], [address], [place], [place] [address]    Special Requests   Final    BOTTLES DRAWN AEROBIC AND ANAEROBIC Blood Culture results may not be optimal due to an inadequate volume of blood received in culture bottles Performed at [facility], [address] [place], [place] [address]    Culture   Final    NO GROWTH 5 DAYS Performed at [facility], [address], [place], [place] [address]    Report Status 07/14/2023 FINAL  Final  Blood culture (routine x 2)     Status: None   Collection Time: 07/09/23  6:26 PM   Specimen: BLOOD  Result Value Ref Range Status   Specimen Description   Final    BLOOD LEFT ANTECUBITAL Performed at [facility], [address], [place], [place] [address]    Special Requests   Final    BOTTLES DRAWN AEROBIC AND ANAEROBIC Blood Culture results may not be optimal due to an inadequate volume of blood received in culture bottles Performed at [facility], [address], [place], [place] [address]    Culture   Final    NO GROWTH 5 DAYS Performed at [facility], [address], [place], [place] [address]    Report Status 07/14/2023 FINAL  Final  Resp panel by RT-PCR (RSV, Flu A&B, Covid) Anterior Nasal Swab     Status: None   Collection Time: 07/10/23  8:42 AM   Specimen: Anterior Nasal Swab  Result Value Ref Range Status   SARS Coronavirus 2 by RT PCR NEGATIVE NEGATIVE Final    Comment: (NOTE) SARS-CoV-2 target nucleic acids are NOT DETECTED.  The  SARS-CoV-2 RNA is generally detectable in  upper respiratory specimens during the acute phase of infection. The lowest concentration of SARS-CoV-2 viral copies this assay can detect is 138 copies/mL. A negative result does not preclude SARS-Cov-2 infection and should not be used as the sole basis for treatment or other patient management decisions. A negative result may occur with  improper specimen collection/handling, submission of specimen other than nasopharyngeal swab, presence of viral mutation(s) within the areas targeted by this assay, and inadequate number of viral copies(<138 copies/mL). A negative result must be combined with clinical observations, patient history, and epidemiological information. The expected result is Negative.  Fact Sheet for Patients:  BloggerCourse.com  Fact Sheet for Healthcare Providers:  SeriousBroker.it  This test is no t yet approved or cleared by the [place]  FDA and  has been authorized for detection and/or diagnosis of SARS-CoV-2 by FDA under an Emergency Use Authorization (EUA). This EUA will remain  in effect (meaning this test can be used) for the duration of the COVID-19 declaration under Section 564(b)(1) of the Act, 21 U.S.C.section 360bbb-3(b)(1), unless the authorization is terminated  or revoked sooner.       Influenza A by PCR NEGATIVE NEGATIVE Final   Influenza B by PCR NEGATIVE NEGATIVE Final    Comment: (NOTE) The Xpert Xpress SARS-CoV-2/FLU/RSV plus assay is intended as an aid in the diagnosis of influenza from Nasopharyngeal swab specimens and should not be used as a sole basis for treatment. Nasal washings and aspirates are unacceptable for Xpert Xpress SARS-CoV-2/FLU/RSV testing.  Fact Sheet for Patients: BloggerCourse.com  Fact Sheet for Healthcare Providers: SeriousBroker.it  This test is not yet approved or cleared by the [place]  FDA and has been  authorized for detection and/or diagnosis of SARS-CoV-2 by FDA under an Emergency Use Authorization (EUA). This EUA will remain in effect (meaning this test can be used) for the duration of the COVID-19 declaration under Section 564(b)(1) of the Act, 21 U.S.C. section 360bbb-3(b)(1), unless the authorization is terminated or revoked.     Resp Syncytial Virus by PCR NEGATIVE NEGATIVE Final    Comment: (NOTE) Fact Sheet for Patients: BloggerCourse.com  Fact Sheet for Healthcare Providers: SeriousBroker.it  This test is not yet approved or cleared by the [place]  FDA and has been authorized for detection and/or diagnosis of SARS-CoV-2 by FDA under an Emergency Use Authorization (EUA). This EUA will remain in effect (meaning this test can be used) for the duration of the COVID-19 declaration under Section 564(b)(1) of the Act, 21 U.S.C. section 360bbb-3(b)(1), unless the authorization is terminated or revoked.  Performed at [facility], [address], [place], [place] [address]   Respiratory (~20 pathogens) panel by PCR     Status: None   Collection Time: 07/10/23  8:42 AM   Specimen: Nasopharyngeal Swab; Respiratory  Result Value Ref Range Status   Adenovirus NOT DETECTED NOT DETECTED Final   Coronavirus 229E NOT DETECTED NOT DETECTED Final    Comment: (NOTE) The Coronavirus on the Respiratory Panel, DOES NOT test for the novel  Coronavirus (2019 nCoV)    Coronavirus HKU1 NOT DETECTED NOT DETECTED Final   Coronavirus NL63 NOT DETECTED NOT DETECTED Final   Coronavirus OC43 NOT DETECTED NOT DETECTED Final   Metapneumovirus NOT DETECTED NOT DETECTED Final   Rhinovirus / Enterovirus NOT DETECTED NOT DETECTED Final   Influenza A NOT DETECTED NOT DETECTED Final   Influenza B NOT DETECTED NOT DETECTED Final   Parainfluenza Virus 1 NOT DETECTED  NOT DETECTED Final   Parainfluenza Virus 2 NOT DETECTED NOT  DETECTED Final   Parainfluenza Virus 3 NOT DETECTED NOT DETECTED Final   Parainfluenza Virus 4 NOT DETECTED NOT DETECTED Final   Respiratory Syncytial Virus NOT DETECTED NOT DETECTED Final   Bordetella pertussis NOT DETECTED NOT DETECTED Final   Bordetella Parapertussis NOT DETECTED NOT DETECTED Final   Chlamydophila pneumoniae NOT DETECTED NOT DETECTED Final   Mycoplasma pneumoniae NOT DETECTED NOT DETECTED Final    Comment: Performed at [facility], [address] [place], [place] [address]  Aerobic/Anaerobic Culture w Gram Stain (surgical/deep wound)     Status: None   Collection Time: 07/13/23  4:17 PM   Specimen: Abscess  Result Value Ref Range Status   Specimen Description   Final    ABSCESS Performed at [facility], [address] [place], [place] [address]    Special Requests   Final    NONE Performed at [facility], [address], [place], [place] [address]    Gram Stain   Final    ABUNDANT WBC PRESENT, PREDOMINANTLY PMN RARE GRAM NEGATIVE RODS    Culture   Final    MODERATE KLEBSIELLA PNEUMONIAE MODERATE CITROBACTER SPECIES ABUNDANT BACTEROIDES OVATUS BETA LACTAMASE POSITIVE Performed at [facility], [address], [place], [place] [address]    Report Status 07/17/2023 FINAL  Final   Organism ID, Bacteria KLEBSIELLA PNEUMONIAE  Final   Organism ID, Bacteria CITROBACTER SPECIES  Final      Susceptibility   Citrobacter species - MIC*    CEFEPIME  <=0.12 SENSITIVE Sensitive     CEFTAZIDIME >=64 RESISTANT Resistant     CEFTRIAXONE  >=64 RESISTANT Resistant     CIPROFLOXACIN  >=4 RESISTANT Resistant     GENTAMICIN <=1 SENSITIVE Sensitive     IMIPENEM 0.5 SENSITIVE Sensitive     TRIMETH /SULFA  >=320 RESISTANT Resistant     PIP/TAZO 16 SENSITIVE Sensitive ug/mL    * MODERATE CITROBACTER SPECIES   Klebsiella pneumoniae - MIC*    AMPICILLIN  >=32 RESISTANT Resistant     CEFEPIME  <=0.12 SENSITIVE Sensitive      CEFTAZIDIME <=1 SENSITIVE Sensitive     CEFTRIAXONE  <=0.25 SENSITIVE Sensitive     CIPROFLOXACIN  <=0.25 SENSITIVE Sensitive     GENTAMICIN <=1 SENSITIVE Sensitive     IMIPENEM <=0.25 SENSITIVE Sensitive     TRIMETH /SULFA  <=20 SENSITIVE Sensitive     AMPICILLIN /SULBACTAM 8 SENSITIVE Sensitive     PIP/TAZO <=4 SENSITIVE Sensitive ug/mL    * MODERATE KLEBSIELLA PNEUMONIAE     Studies: US  EKG SITE RITE Result Date: 07/17/2023 If Site Rite image not attached, placement could not be confirmed due to current cardiac rhythm.     Jonathen Rathman, MD  [facility] 07/19/2023  If 7PM-7AM, please contact night-coverage

## 2023-07-19 NOTE — Plan of Care (Signed)

## 2023-07-20 DIAGNOSIS — R6510 Systemic inflammatory response syndrome (SIRS) of non-infectious origin without acute organ dysfunction: Secondary | ICD-10-CM | POA: Diagnosis not present

## 2023-07-20 LAB — GLUCOSE, CAPILLARY: Glucose-Capillary: 125 mg/dL — ABNORMAL HIGH (ref 70–99)

## 2023-07-20 MED ORDER — TRAMADOL HCL 50 MG PO TABS: 50.0000 mg | ORAL_TABLET | Freq: Two times a day (BID) | ORAL | 0 refills | Status: DC | PRN

## 2023-07-20 MED ORDER — HEPARIN SOD (PORK) LOCK FLUSH 100 UNIT/ML IV SOLN
250.0000 [IU] | INTRAVENOUS | Status: AC | PRN
Start: 2023-07-20 — End: 2023-07-20
  Administered 2023-07-20: 250 [IU]

## 2023-07-20 MED ORDER — SENNOSIDES-DOCUSATE SODIUM 8.6-50 MG PO TABS: 1.0000 | ORAL_TABLET | Freq: Every evening | ORAL | 0 refills | Status: DC | PRN

## 2023-07-20 MED ORDER — LIDOCAINE 5 % EX PTCH: 1.0000 | MEDICATED_PATCH | CUTANEOUS | 0 refills | Status: DC

## 2023-07-20 NOTE — Progress Notes (Signed)
 Referring Physician(s): Pokhrel,L  Supervising Physician: Art Largo  Patient Status:  [facility] - In-pt  Chief Complaint: Right lower abdominal/flank pain, retroperitoneal abscess, status post drain placement on 07/16/2023    Subjective: Patient doing fairly well this morning.  Awaiting discharge home.  Currently denies worsening abdominal pain, fever, nausea, vomiting   Allergies: Rifaximin , Beta adrenergic blockers, and Calcium  channel blockers  Medications: Prior to Admission medications   Medication Sig Start Date End Date Taking? Authorizing Provider  ertapenem (INVANZ) IVPB Inject 1 g into the vein daily for 19 days. Indication:  RETROPERITONEAL ABSCESS First Dose: Yes Last Day of Therapy:  08/06/23 Labs - Once weekly:  CBC/D and BMP, Labs - Once weekly: ESR and CRP Method of administration: Mini-Bag Plus / Gravity Method of administration may be changed at the discretion of home infusion pharmacist based upon assessment of the patient and/or caregiver's ability to self-administer the medication ordered. 07/18/23 08/06/23 Yes Orlie Bjornstad, MD  gabapentin  (NEURONTIN ) 300 MG capsule Take 1 capsule (300 mg total) by mouth 3 (three) times daily. Patient taking differently: Take 300-600 mg by mouth See admin instructions. Take 300mg  in the AM and 600mg  in the PM. 05/01/23  Yes [name], MD  insulin  glargine, 2 Unit Dial , (TOUJEO  MAX SOLOSTAR) 300 UNIT/ML Solostar Pen Inject 12 Units into the skin daily. Via SANOFI pt assistance 05/01/23  Yes [name], MD  metFORMIN  (GLUCOPHAGE ) 500 MG tablet TAKE 1 TABLET BY MOUTH TWICE A DAY WITH FOOD 04/23/23  Yes [name], MD  Multiple Vitamins-Minerals (CENTRUM SILVER 50+MEN) TABS Take 1 tablet by mouth daily.   Yes [provider]  pantoprazole  (PROTONIX ) 40 MG tablet TAKE 1 TABLET BY MOUTH 2 TIMES DAILY 30 MINUTES BEFORE MEALS (BREAKFAST AND SUPPER) 06/30/23  Yes [name], MD  cyclobenzaprine  (FLEXERIL ) 10 MG  tablet Take 10 mg by mouth 3 (three) times daily. Patient not taking: Reported on 07/10/2023 07/09/23   [provider]  HYDROcodone -acetaminophen  (NORCO/VICODIN) 5-325 MG tablet Take 1 tablet by mouth every 6 (six) hours as needed. Patient not taking: Reported on 07/10/2023 07/09/23   [provider]  Insulin  Pen Needle (BD PEN NEEDLE MINI ULTRAFINE) 31G X 5 MM MISC INJECT 1 ACT INTO THE SKIN DAILY. USE TO ADMINISTER INSULIN . DX E11.9   Strength: 31G X 5 MM 06/30/23   [name], MD  lidocaine  (LIDODERM ) 5 % Place 1 patch onto the skin daily. Remove & Discard patch within 12 hours or as directed by MD 07/20/23   Pokhrel, Amador Bad, MD  senna-docusate (SENOKOT-S) 8.6-50 MG tablet Take 1 tablet by mouth at bedtime as needed for mild constipation or moderate constipation. 07/20/23   Pokhrel, Laxman, MD  traMADol  (ULTRAM ) 50 MG tablet Take 1 tablet (50 mg total) by mouth every 12 (twelve) hours as needed for severe pain (pain score 7-10). 07/20/23   Pokhrel, Amador Bad, MD     Vital Signs: BP (!) 141/71 (BP Location: Left Arm)   Pulse 76   Temp (!) 97.3 F (36.3 C) (Oral)   Resp 18   Ht 6' 2 (1.88 m)   Wt 213 lb 13.5 oz (97 kg)   SpO2 96%   BMI 27.46 kg/m   Physical Exam awake, alert.  Right lower quadrant drain intact, insertion site okay, minimal tenderness to palpation, output 65 cc blood-tinged fluid.  Drain flushed without difficulty.  Imaging: US  EKG SITE RITE Result Date: 07/17/2023 If Site Rite image not attached, placement could  not be confirmed due to current cardiac rhythm.  CT GUIDED PERITONEAL/RETROPERITONEAL FLUID DRAIN BY PERC CATH Result Date: 07/16/2023 INDICATION: [id number] Retroperitoneal abscess [facility]) [id number] Briefly, 78 year old male with RIGHT flank retroperitoneal abscess s/p recent drain placement 07/13/2023. Patient is reportedly encephalopathic and pulled out his catheter. Request for replacement EXAM: CT-GUIDED RIGHT FLANK/RETROPERITONEAL ABSCESS DRAINAGE  CATHETER PLACEMENT COMPARISON:  IR CT, 07/11/2023.  CT AP, 07/15/2023. MEDICATIONS: The patient is currently admitted to the hospital and receiving intravenous antibiotics. The antibiotics were administered within an appropriate time frame prior to the initiation of the procedure. ANESTHESIA/SEDATION: Local anesthetic and single agent sedation was employed during this procedure. A total of fentanyl  100 mcg was administered intravenously. The patient's level of consciousness and vital signs were monitored continuously by radiology nursing throughout the procedure under my direct supervision. CONTRAST:  None FLUOROSCOPY TIME:  CT dose; 985 mGycm COMPLICATIONS: None immediate. PROCEDURE: RADIATION DOSE REDUCTION: This exam was performed according to the departmental dose-optimization program which includes automated exposure control, adjustment of the mA and/or kV according to patient size and/or use of iterative reconstruction technique. Informed written consent was obtained from the patient after a discussion of the risks, benefits and alternatives to treatment. The patient was placed supine on the CT gantry and a pre procedural CT was performed re-demonstrating the known abscess/fluid collection within the RIGHT retroperitoneum. The procedure was planned. A timeout was performed prior to the initiation of the procedure. The RIGHT flank was prepped and draped in the usual sterile fashion. The overlying soft tissues were anesthetized with 1% lidocaine  with epinephrine . Appropriate trajectory was planned with the use of a 22 gauge spinal needle. An 18 gauge trocar needle was advanced into the abscess/fluid collection and a short Amplatz super stiff wire was coiled within the collection. Appropriate positioning was confirmed with a limited CT scan. The tract was serially dilated allowing placement of a 14 Fr drainage catheter. Appropriate positioning was confirmed with a limited postprocedural CT scan. 10 mL of purulent  fluid was aspirated. The tube was connected to a bulb suction and sutured in place. A dressing was placed. The patient tolerated the procedure well without immediate post procedural complication. IMPRESSION: Successful CT-guided placement of a 14 Fr drainage catheter into the RIGHT retroperitoneal abscess. RECOMMENDATIONS: The patient will return to Vascular Interventional Radiology (VIR) for routine drainage catheter evaluation and exchange in 10-14 days. Art Largo, MD [facility] Electronically Signed   By: Art Largo M.D.   On: 07/16/2023 15:49    Labs:  CBC: Recent Labs    07/14/23 0551 07/15/23 1156 07/16/23 0618 07/18/23 0611  WBC 11.4* 11.9* 14.7* 8.0  HGB 9.7* 9.9* 9.5* 10.2*  HCT 28.6* 30.0* 30.0* 31.7*  PLT 173 229 253 293    COAGS: Recent Labs    12/03/22 1850 12/08/22 0834 07/09/23 0954 07/10/23 0509 07/11/23 0738 07/12/23 0644  INR 1.5*   < > 1.5* 1.4* 1.4* 1.4*  APTT 32  --   --   --   --   --    < > = values in this interval not displayed.    BMP: Recent Labs    07/13/23 1804 07/15/23 1156 07/16/23 0618 07/18/23 0611  NA 133* 134* 134* 135  K 3.7 3.5 3.4* 3.7  CL 100 99 99 98  CO2 22 27 24 28   GLUCOSE 159* 184* 122* 131*  BUN 18 17 16 14   CALCIUM  8.5* 8.6* 8.6* 8.5*  CREATININE 0.93 0.81 0.85  0.82  GFRNONAA >60 >60 >60 >60    LIVER FUNCTION TESTS: Recent Labs    07/09/23 1733 07/10/23 0509 07/15/23 1156 07/18/23 0611  BILITOT 1.4* 1.1 1.0 0.9  AST 17 14* 18 23  ALT 13 12 15 16   ALKPHOS 103 94 103 121  PROT 6.3* 5.8* 5.8* 6.1*  ALBUMIN  2.6* 2.3* 2.1* 2.2*    Assessment and Plan: Pt with hx CLL, alcoholic and NASH cirrhosis with hepatic encephalopathy, thrombocytopenia, portal HTN, H/o SBP in 2020, DM, peripheral neuropathy, prostate cancer, BPH, chronic back pain , osteoarthritis and right RP abscess/infected hematoma, s/p drain placement 07/13/23 and subsequent replacement on  6/12 secondary to displacement.  Afebrile, no new labs; drain fluid cultures with Klebsiella and Citrobacter; as outpatient recommend once daily flush of drain with 5 cc sterile saline, output recording and gauze dressing change over site every 2 to 3 days.  Patient's spouse instructed on how to flush and manage drain.  Patient will be scheduled for IR drain clinic follow-up in 10 to 14 days.   Electronically Signed: D. Honore Lux, PA-C 07/20/2023, 12:23 PM   I spent a total of 15 Minutes at the the patient's bedside AND on the patient's hospital floor or unit, greater than 50% of which was counseling/coordinating care for right retroperitoneal abscess drain    Patient ID: Patrick Kidd, male   DOB: 23-Mar-1945, 78 y.o.   MRN: [id number]

## 2023-07-20 NOTE — Plan of Care (Signed)
 Went over discharge instructions with pt and pts wife, they verbalized understanding, PIV intake upon removal, PT going home with PICC. Going by private vehicle.    Problem: Education: Goal: Knowledge of General Education information will improve Description: Including pain rating scale, medication(s)/side effects and non-pharmacologic comfort measures Outcome: Adequate for Discharge   Problem: Health Behavior/Discharge Planning: Goal: Ability to manage health-related needs will improve Outcome: Adequate for Discharge   Problem: Clinical Measurements: Goal: Ability to maintain clinical measurements within normal limits will improve Outcome: Adequate for Discharge Goal: Will remain free from infection Outcome: Adequate for Discharge Goal: Diagnostic test results will improve Outcome: Adequate for Discharge Goal: Respiratory complications will improve Outcome: Adequate for Discharge Goal: Cardiovascular complication will be avoided Outcome: Adequate for Discharge   Problem: Activity: Goal: Risk for activity intolerance will decrease Outcome: Adequate for Discharge   Problem: Nutrition: Goal: Adequate nutrition will be maintained Outcome: Adequate for Discharge   Problem: Coping: Goal: Level of anxiety will decrease Outcome: Adequate for Discharge   Problem: Elimination: Goal: Will not experience complications related to bowel motility Outcome: Adequate for Discharge Goal: Will not experience complications related to urinary retention Outcome: Adequate for Discharge   Problem: Pain Managment: Goal: General experience of comfort will improve and/or be controlled Outcome: Adequate for Discharge   Problem: Safety: Goal: Ability to remain free from injury will improve Outcome: Adequate for Discharge   Problem: Skin Integrity: Goal: Risk for impaired skin integrity will decrease Outcome: Adequate for Discharge   Problem: Education: Goal: Ability to describe self-care  measures that may prevent or decrease complications (Diabetes Survival Skills Education) will improve Outcome: Adequate for Discharge Goal: Individualized Educational Video(s) Outcome: Adequate for Discharge   Problem: Coping: Goal: Ability to adjust to condition or change in health will improve Outcome: Adequate for Discharge   Problem: Fluid Volume: Goal: Ability to maintain a balanced intake and output will improve Outcome: Adequate for Discharge   Problem: Health Behavior/Discharge Planning: Goal: Ability to identify and utilize available resources and services will improve Outcome: Adequate for Discharge Goal: Ability to manage health-related needs will improve Outcome: Adequate for Discharge   Problem: Metabolic: Goal: Ability to maintain appropriate glucose levels will improve Outcome: Adequate for Discharge   Problem: Nutritional: Goal: Maintenance of adequate nutrition will improve Outcome: Adequate for Discharge Goal: Progress toward achieving an optimal weight will improve Outcome: Adequate for Discharge   Problem: Skin Integrity: Goal: Risk for impaired skin integrity will decrease Outcome: Adequate for Discharge   Problem: Tissue Perfusion: Goal: Adequacy of tissue perfusion will improve Outcome: Adequate for Discharge

## 2023-07-20 NOTE — Progress Notes (Signed)
 PT Cancellation Note  Patient Details Name: Patrick Kidd MRN: [id number] DOB: 25-Dec-1945   Cancelled Treatment:    Reason Eval/Treat Not Completed: Other (comment). Pt to d/c home with [facility] services and family support per nurse. PT to defer to next level of care.   Patrick Kidd, PT Acute Rehab   Annalee Kiang 07/20/2023, 10:36 AM

## 2023-07-20 NOTE — Plan of Care (Signed)
  Problem: Clinical Measurements: Goal: Will remain free from infection Outcome: Progressing   Problem: Activity: Goal: Risk for activity intolerance will decrease Outcome: Progressing   Problem: Elimination: Goal: Will not experience complications related to urinary retention Outcome: Progressing   Problem: Pain Managment: Goal: General experience of comfort will improve and/or be controlled Outcome: Progressing   Problem: Safety: Goal: Ability to remain free from injury will improve Outcome: Progressing

## 2023-07-20 NOTE — Discharge Summary (Signed)
 Physician Discharge Summary  PENNY FRISBIE FAO:[id number] DOB: 1945/07/23 DOA: 07/09/2023  PCP: [name], MD  Admit date: 07/09/2023 Discharge date: 07/20/2023  Admitted From: Home  Discharge disposition: Home with home health   Recommendations for Outpatient Follow-Up:   Follow up with your primary care provider in one week.  Check CBC, BMP, magnesium  in the next visit Follow-up with interventional radiology for drain tube management as outpatient. Continue IV antibiotics through PICC line.  Home health to manage.  Follow-up with infectious disease as outpatient as scheduled by the clinic on 6/30 with Dr. Zelda Hickman..  Discharge Diagnosis:   Principal Problem:   SIRS (systemic inflammatory response syndrome) (HCC) Active Problems:   Type 2 diabetes mellitus with complication, with long-term current use of insulin  (HCC)   Essential hypertension   WPW (Wolff-Parkinson-White syndrome)   Chronic lymphocytic leukemia (HCC)   BPH associated with nocturia   Gout, unspecified   OSA (obstructive sleep apnea)   Portal hypertension (HCC)   Thrombocytopenia (HCC)   Hematoma   Discharge Condition: Improved.  Diet recommendation: Low sodium, heart healthy.  Carbohydrate-modified.    Wound care: None.  Code status: Full.   History of Present Illness:   Patient is a 78 years old male with CLL, alcoholic and NASH cirrhosis with hepatic encephalopathy, thrombocytopenia, portal HTN,  H/o SBP in 2020, DM, peripheral neuropathy, prostate cancer, BPH, chronic back pain and osteoarthritis presented to the hospital abnormal CT of the abd/pelvis.  He reported fever 3 to 4 weeks ago with increasing weakness and decreased appetite and a fall 3 weeks back.  He had seen GI on 07/09/2023 due to abdominal pain and lack of appetite.  CT scan of the abdomen and pelvis done showed 9.5 x 12.7 collection in right lower abdomen favoring probable evolving hematoma with splenomegaly.  Patient was then  admitted to the hospital for further evaluation and treatment.   Hospital Course:   Following conditions were addressed during hospitalization as listed below,  Sepsis secondary to retroperitoneal hematoma infection. Patient was initially on vancomycin  and cefepime .  Blood cultures negative so far in 5 days.  Status post IR intervention and drainage of 60 cc of purulent fluid.  Patient had drain tube displaced so had to be reinserted by interventional radiology.   Fluid culture now with Klebsiella pneumonia, Citrobacter.  Infectious disease was consulted and at this time plan is IV ertapenem for 3 weeks.  Status post PICC line placement.  Outpatient antibiotic treatments will be provided on discharge and managed by home health.  Patient will follow-up with ID as outpatient on 08/03/2023   Acute toxic encephalopathy  Likely secondary to narcotics and infection. Aaron Aas  Resolved at this time.   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 Elevated INR Intermittent thrombocytopenia Platelets have improved.  No evidence of bleeding.  Latest platelet of 293     Type 2 diabetes mellitus with complication, with long-term current use of insulin , uncontrolled Patient is on metformin , Toujeo  at home will be resumed on discharge.    Chronic lymphocytic leukemia  Last evaluated in October 2024-did not need treatment at that time   Hypokalemia.  Latest potassium of 3.7.   Hypomagnesemia.  Improved after replacement.  Latest magnesium  1.9.   Debility deconditioning.  Seen by PT OT and recommend home health on discharge..    Disposition.  At this time, patient is stable for disposition home with outpatient IR and ID follow-up.  Medical Consultants:   Interventional radiology ID  Procedures:    CT-guided retroperitoneal abscess drainage catheter placement on 07/13/2023 by interventional radiology CT-guided retroperitoneal drainage catheter placement on 07/16/2023. Subjective:   Today, patient was  seen and examined at bedside.  Denies any nausea, vomiting, fever, chills or rigor.  Wants to go home.  Discharge Exam:   Vitals:   07/19/23 2151 07/20/23 0510  BP: 136/73 (!) 141/71  Pulse: 89 76  Resp: 16 18  Temp: 98 F (36.7 C) (!) 97.3 F (36.3 C)  SpO2: 98% 96%   Vitals:   07/19/23 0516 07/19/23 1335 07/19/23 2151 07/20/23 0510  BP: 133/69  136/73 (!) 141/71  Pulse: 74 93 89 76  Resp: 16 18 16 18   Temp: 97.8 F (36.6 C) (!) 97.4 F (36.3 C) 98 F (36.7 C) (!) 97.3 F (36.3 C)  TempSrc: Oral Oral Oral Oral  SpO2: 95% 99% 98% 96%  Weight:      Height:        General: Alert awake, not in obvious distress HENT: pupils equally reacting to light,  No scleral pallor or icterus noted. Oral mucosa is moist.  Chest:  Clear breath sounds.  Diminished breath sounds bilaterally. No crackles or wheezes.  CVS: S1 &S2 heard. No murmur.  Regular rate and rhythm. Abdomen: Soft, nontender, nondistended.  Bowel sounds are heard.  Abdominal binder in place. Extremities: No cyanosis, clubbing or edema.  Peripheral pulses are palpable. Psych: Alert, awake and oriented, normal mood CNS:  No cranial nerve deficits.  Power equal in all extremities.   Skin: Warm and dry.  No rashes noted.  The results of significant diagnostics from this hospitalization (including imaging, microbiology, ancillary and laboratory) are listed below for reference.     Diagnostic Studies:   CT L-SPINE NO CHARGE Result Date: 07/09/2023 CLINICAL DATA:  Right-sided abdominal pain EXAM: CT LUMBAR SPINE WITHOUT CONTRAST TECHNIQUE: Multidetector CT imaging of the lumbar spine was performed without intravenous contrast administration. Multiplanar CT image reconstructions were also generated. RADIATION DOSE REDUCTION: This exam was performed according to the departmental dose-optimization program which includes automated exposure control, adjustment of the mA and/or kV according to patient size and/or use of iterative  reconstruction technique. COMPARISON:  CT abdomen and pelvis same day FINDINGS: Segmentation: 5 lumbar type vertebrae. Alignment: Normal. Vertebrae: No acute fracture or focal pathologic process. There is chronic appearing fragmentation of the right L2 transverse process which may be related to old injury. There is a healed left L2 transverse process fracture. Transverse process Paraspinal and other soft tissues: Negative. Disc levels: There is mild disc space narrowing and endplate osteophyte formation throughout the lumbar spine compatible with degenerative change. T12-L1: No central canal or neural foraminal stenosis. L1-L2: There is bilateral facet arthropathy. No central canal or neural foraminal stenosis. L2-L3: There is bilateral facet arthropathy and thickening of the ligamentum flavum. Mild disc bulge present. There is moderate central canal stenosis. L3-L4: There is bilateral facet arthropathy with thickening of the ligamentum flavum and broad-based disc bulge. There is moderate central canal stenosis. There is mild right neural foraminal stenosis. L4-L5: There is bilateral facet arthropathy with thickening of the ligamentum flavum and broad-based disc bulge. There is moderate central canal stenosis. There is mild bilateral neural foraminal stenosis. L5-S1: There is bilateral facet arthropathy. There is mild right neural foraminal stenosis. No central canal stenosis. Other: Stranding is seen along the intramuscular right pelvic sidewall. Please see CT of the abdomen and pelvis for further description. IMPRESSION: 1. No acute fracture or traumatic  subluxation of the lumbar spine. 2. Multilevel degenerative changes of the lumbar spine with moderate central canal stenosis at L2-L3, L3-L4, and L4-L5. 3. Mild neural foraminal stenosis at L3-L4, L4-L5, and L5-S1. 4. Stranding along the intramuscular right pelvic sidewall. Please see CT of the abdomen and pelvis for further description. Electronically Signed    By: Tyron Gallon M.D.   On: 07/09/2023 20:18   CT ABDOMEN PELVIS W CONTRAST Result Date: 07/09/2023 CLINICAL DATA:  Bowel obstruction suspected. Abdominal mass. Alcoholic cirrhosis. EXAM: CT ABDOMEN AND PELVIS WITH CONTRAST TECHNIQUE: Multidetector CT imaging of the abdomen and pelvis was performed using the standard protocol following bolus administration of intravenous contrast. RADIATION DOSE REDUCTION: This exam was performed according to the departmental dose-optimization program which includes automated exposure control, adjustment of the mA and/or kV according to patient size and/or use of iterative reconstruction technique. CONTRAST:  OMNIPAQUE  IOHEXOL  300 MG/ML  SOLN COMPARISON:  CT scan abdomen and pelvis from 12/03/2022. FINDINGS: Lower chest: The lung bases are clear. No pleural effusion. The heart is normal in size. No pericardial effusion. Hepatobiliary: The liver is normal in size. There is mild liver surface irregularity/nodularity, compatible with cirrhosis. No suspicious mass. Note is made of 6.2 x 6.4 cm cyst in the right hepatic lobe, which is stable since the prior study. No intrahepatic or extrahepatic bile duct dilation. Gallbladder is surgically absent. Pancreas: Unremarkable. No pancreatic ductal dilatation or surrounding inflammatory changes. Spleen: Spleen is enlarged measuring upto 9.7 x 16.5 cm orthogonally on coronal plane. No focal lesion. Adrenals/Urinary Tract: Adrenal glands are unremarkable. No suspicious renal mass. There is a 2 mm nonobstructing calculus in the right kidney interpolar region. No other nephroureterolithiasis or obstructive uropathy on either side. Urinary bladder is under distended, precluding optimal assessment. However, no large mass or stones identified. No perivesical fat stranding. Stomach/Bowel: No disproportionate dilation of the small or large bowel loops. No evidence of abnormal bowel wall thickening or inflammatory changes. The appendix is  unremarkable. There are scattered diverticula throughout the colon, without imaging signs of diverticulitis. Vascular/Lymphatic: There is an approximately 9.5 x 12.7 cm soft tissue attenuation collection in the right lower abdomen. Inferiorly, the collection is inseparable from the right iliopsoas muscle however, the epicenter of the collection is outside the muscle. There are few slightly hyperattenuating area along the posteroinferior aspect of the collection and small nodular slightly hyperattenuating area along the anterosuperior aspect of the collection. There is no discrete wall. Findings favor probable evolving hematoma. Correlate clinically. No abdominal or pelvic lymphadenopathy, by size criteria. No aneurysmal dilation of the major abdominal arteries. There are moderate peripheral atherosclerotic vascular calcifications of the aorta and its major branches. Reproductive: Normal size prostate. Symmetric seminal vesicles. Multiple radiation seeds noted in the prostate. There is partially visualized small-to-moderate right hydrocele. Other: There is a tiny fat containing umbilical hernia. The soft tissues and abdominal wall are otherwise unremarkable. Musculoskeletal: No suspicious osseous lesions. There are moderate multilevel degenerative changes in the visualized spine. IMPRESSION: 1. There is an approximately 9.5 x 12.7 cm soft tissue attenuation collection in the right lower abdomen, as described above. Findings favor probable evolving hematoma. 2. No bowel obstruction. 3. Multiple other nonacute observations, as described above. Aortic Atherosclerosis (ICD10-I70.0). Electronically Signed   By: Beula Brunswick M.D.   On: 07/09/2023 11:31   DG Chest 2 View Result Date: 07/09/2023 CLINICAL DATA:  Short of breath, fatigue, fever, weight loss EXAM: CHEST - 2 VIEW COMPARISON:  12/08/2022 FINDINGS: Frontal and  lateral views of the chest demonstrate an unremarkable cardiac silhouette. No acute airspace disease,  effusion, or pneumothorax. No acute bony abnormalities. IMPRESSION: 1. No acute intrathoracic process. Electronically Signed   By: Bobbye Burrow M.D.   On: 07/09/2023 10:38     Labs:   Basic Metabolic Panel: Recent Labs  Lab 07/13/23 1804 07/15/23 1156 07/16/23 0618 07/18/23 0611  NA 133* 134* 134* 135  K 3.7 3.5 3.4* 3.7  CL 100 99 99 98  CO2 22 27 24 28   GLUCOSE 159* 184* 122* 131*  BUN 18 17 16 14   CREATININE 0.93 0.81 0.85 0.82  CALCIUM  8.5* 8.6* 8.6* 8.5*  MG  --  1.5* 2.1 1.9   GFR Estimated Creatinine Clearance: 86.3 mL/min (by C-G formula based on SCr of 0.82 mg/dL). Liver Function Tests: Recent Labs  Lab 07/15/23 1156 07/18/23 0611  AST 18 23  ALT 15 16  ALKPHOS 103 121  BILITOT 1.0 0.9  PROT 5.8* 6.1*  ALBUMIN  2.1* 2.2*   No results for input(s): LIPASE, AMYLASE in the last 168 hours. Recent Labs  Lab 07/13/23 1417  AMMONIA 19   Coagulation profile No results for input(s): INR, PROTIME in the last 168 hours.  CBC: Recent Labs  Lab 07/14/23 0551 07/15/23 1156 07/16/23 0618 07/18/23 0611  WBC 11.4* 11.9* 14.7* 8.0  HGB 9.7* 9.9* 9.5* 10.2*  HCT 28.6* 30.0* 30.0* 31.7*  MCV 89.4 90.9 92.6 93.0  PLT 173 229 253 293   Cardiac Enzymes: No results for input(s): CKTOTAL, CKMB, CKMBINDEX, TROPONINI in the last 168 hours. BNP: Invalid input(s): POCBNP CBG: Recent Labs  Lab 07/19/23 0717 07/19/23 1120 07/19/23 1615 07/19/23 2141 07/20/23 0738  GLUCAP 133* 120* 183* 189* 125*   D-Dimer No results for input(s): DDIMER in the last 72 hours. Hgb A1c No results for input(s): HGBA1C in the last 72 hours. Lipid Profile No results for input(s): CHOL, HDL, LDLCALC, TRIG, CHOLHDL, LDLDIRECT in the last 72 hours. Thyroid  function studies No results for input(s): TSH, T4TOTAL, T3FREE, THYROIDAB in the last 72 hours.  Invalid input(s): FREET3 Anemia work up No results for input(s): VITAMINB12,  FOLATE, FERRITIN, TIBC, IRON, RETICCTPCT in the last 72 hours. Microbiology Recent Results (from the past 240 hours)  Aerobic/Anaerobic Culture w Gram Stain (surgical/deep wound)     Status: None   Collection Time: 07/13/23  4:17 PM   Specimen: Abscess  Result Value Ref Range Status   Specimen Description   Final    ABSCESS Performed at [facility], [address], [place], [place] [address]    Special Requests   Final    NONE Performed at [facility] [address] [place], [place] [address]    Gram Stain   Final    ABUNDANT WBC PRESENT, PREDOMINANTLY PMN RARE GRAM NEGATIVE RODS    Culture   Final    MODERATE KLEBSIELLA PNEUMONIAE MODERATE CITROBACTER SPECIES ABUNDANT BACTEROIDES OVATUS BETA LACTAMASE POSITIVE Performed at [facility], [address] [place], [place] [address]    Report Status 07/17/2023 FINAL  Final   Organism ID, Bacteria KLEBSIELLA PNEUMONIAE  Final   Organism ID, Bacteria CITROBACTER SPECIES  Final      Susceptibility   Citrobacter species - MIC*    CEFEPIME  <=0.12 SENSITIVE Sensitive     CEFTAZIDIME >=64 RESISTANT Resistant     CEFTRIAXONE  >=64 RESISTANT Resistant     CIPROFLOXACIN  >=4 RESISTANT Resistant     GENTAMICIN <=1 SENSITIVE Sensitive  IMIPENEM 0.5 SENSITIVE Sensitive     TRIMETH /SULFA  >=320 RESISTANT Resistant     PIP/TAZO 16 SENSITIVE Sensitive ug/mL    * MODERATE CITROBACTER SPECIES   Klebsiella pneumoniae - MIC*    AMPICILLIN  >=32 RESISTANT Resistant     CEFEPIME  <=0.12 SENSITIVE Sensitive     CEFTAZIDIME <=1 SENSITIVE Sensitive     CEFTRIAXONE  <=0.25 SENSITIVE Sensitive     CIPROFLOXACIN  <=0.25 SENSITIVE Sensitive     GENTAMICIN <=1 SENSITIVE Sensitive     IMIPENEM <=0.25 SENSITIVE Sensitive     TRIMETH /SULFA  <=20 SENSITIVE Sensitive     AMPICILLIN /SULBACTAM 8 SENSITIVE Sensitive     PIP/TAZO <=4 SENSITIVE Sensitive ug/mL    * MODERATE KLEBSIELLA PNEUMONIAE      Discharge Instructions:   Discharge Instructions     [facility] pharmacist to adjust dose for Vancomycin , Aminoglycosides and other anti-infective therapies as requested by physician.   Complete by: As directed    [facility] to provide Cath Flo 2mg    Complete by: As directed    Administer for PICC line occlusion and as ordered by physician for other access device issues.   Anaphylaxis Kit: Provided to treat any anaphylactic reaction to the medication being provided to the patient if First Dose or when requested by physician   Complete by: As directed    Epinephrine  1mg /ml vial / amp: Administer 0.3mg  (0.66ml) subcutaneously once for moderate to severe anaphylaxis, nurse to call physician and pharmacy when reaction occurs and call 911 if needed for immediate care   Diphenhydramine 50mg /ml IV vial: Administer 25-50mg  IV/IM PRN for first dose reaction, rash, itching, mild reaction, nurse to call physician and pharmacy when reaction occurs   Sodium Chloride  0.9% NS 500ml IV: Administer if needed for hypovolemic blood pressure drop or as ordered by physician after call to physician with anaphylactic reaction   Call MD for:  persistant nausea and vomiting   Complete by: As directed    Call MD for:  severe uncontrolled pain   Complete by: As directed    Call MD for:  temperature >100.4   Complete by: As directed    Change dressing on IV access line weekly and PRN   Complete by: As directed    Diet Carb Modified   Complete by: As directed    Discharge instructions   Complete by: As directed    Follow-up with your primary care provider in 1 week.  Check blood work at that time.  Continue antibiotics on discharge.  Follow-up with infectious disease clinic as outpatient.  Follow-up with interventional radiology for drain tube management.   Flush IV access with Sodium Chloride  0.9% and Heparin  10 units/ml or 100 units/ml   Complete by: As directed    Home infusion  instructions - [facility]   Complete by: As directed    Instructions: Flush IV access with Sodium Chloride  0.9% and Heparin  10units/ml or 100units/ml   Change dressing on IV access line: Weekly and PRN   Instructions Cath Flo 2mg : Administer for PICC Line occlusion and as ordered by physician for other access device   [facility] pharmacist to adjust dose for: Vancomycin , Aminoglycosides and other anti-infective therapies as requested by physician   Increase activity slowly   Complete by: As directed    Method of administration may be changed at the discretion of home infusion pharmacist based upon assessment of the patient and/or caregiver's ability to self-administer the medication ordered   Complete by: As directed  No wound care   Complete by: As directed       Allergies as of 07/20/2023       Reactions   Rifaximin  Nausea And Vomiting   Required EMS visit, although patient did not go to hospital   Beta Adrenergic Blockers    Likely WPW.  Use with caution   Calcium  Channel Blockers    Likely WPW.  Use with caution.        Medication List     STOP taking these medications    cyclobenzaprine  10 MG tablet Commonly known as: FLEXERIL    HYDROcodone -acetaminophen  5-325 MG tablet Commonly known as: NORCO/VICODIN       TAKE these medications    BD Pen Needle Mini Ultrafine 31G X 5 MM Misc Generic drug: Insulin  Pen Needle INJECT 1 ACT INTO THE SKIN DAILY. USE TO ADMINISTER INSULIN . DX E11.9   Strength: 31G X 5 MM   Centrum Silver 50+Men Tabs Take 1 tablet by mouth daily.   ertapenem IVPB Commonly known as: INVANZ Inject 1 g into the vein daily for 19 days. Indication:  RETROPERITONEAL ABSCESS First Dose: Yes Last Day of Therapy:  08/06/23 Labs - Once weekly:  CBC/D and BMP, Labs - Once weekly: ESR and CRP Method of administration: Mini-Bag Plus / Gravity Method of administration may be changed at the discretion of home infusion pharmacist  based upon assessment of the patient and/or caregiver's ability to self-administer the medication ordered.   gabapentin  300 MG capsule Commonly known as: NEURONTIN  Take 1 capsule (300 mg total) by mouth 3 (three) times daily. What changed:  how much to take when to take this additional instructions   lidocaine  5 % Commonly known as: LIDODERM  Place 1 patch onto the skin daily. Remove & Discard patch within 12 hours or as directed by MD   metFORMIN  500 MG tablet Commonly known as: GLUCOPHAGE  TAKE 1 TABLET BY MOUTH TWICE A DAY WITH FOOD   pantoprazole  40 MG tablet Commonly known as: PROTONIX  TAKE 1 TABLET BY MOUTH 2 TIMES DAILY 30 MINUTES BEFORE MEALS (BREAKFAST AND SUPPER)   senna-docusate 8.6-50 MG tablet Commonly known as: Senokot-S Take 1 tablet by mouth at bedtime as needed for mild constipation or moderate constipation.   Toujeo  Max SoloStar 300 UNIT/ML Solostar Pen Generic drug: insulin  glargine (2 Unit Dial ) Inject 12 Units into the skin daily. Via SANOFI pt assistance   traMADol  50 MG tablet Commonly known as: ULTRAM  Take 1 tablet (50 mg total) by mouth every 12 (twelve) hours as needed for severe pain (pain score 7-10).               Discharge Care Instructions  (From admission, onward)           Start     Ordered   07/17/23 0000  Change dressing on IV access line weekly and PRN  (Home infusion instructions - [facility] )        07/17/23 1513            Follow-up Information     [facility] Follow up.   Specialty: Home Health Services Contact information: [address] [place] [address] [phone]         [facility] Follow up.   Contact information: (Home IV Antibiotics) [address] [place], [place] [address] [phone]        [name], MD Follow up in 1 week(s).   Specialty: Internal Medicine Contact information: [address] [place] [address] [phone]          Orlie Bjornstad, MD Follow up on 08/03/2023.   Specialty: Infectious Diseases Contact information: [address], [address] [place] [address] [phone]                  Time coordinating discharge: 39 minutes  Signed:  Cydni Reddoch  [facility] 07/20/2023, 1:44 PM

## 2023-07-20 NOTE — Progress Notes (Signed)
 OT Cancellation Note  Patient Details Name: ABDIFATAH COLQUHOUN MRN: [id number] DOB: 1945-05-15   Cancelled Treatment:    Reason Eval/Treat Not Completed: Other (comment) Patient to d/c home with [facility] and family support per nurse. OT to defer to next level of care.  Wynette Heckler, MS Acute Rehabilitation Department Office# [phone]  07/20/2023, 10:28 AM

## 2023-07-20 NOTE — Care Management Important Message (Signed)
 Important Message  Patient Details IM Letter given to the Patient Name: Patrick Kidd MRN: [id number] Date of Birth: 01-Dec-1945   Important Message Given:  Yes - Medicare IM     [name] 07/20/2023, 1:49 PM

## 2023-07-21 ENCOUNTER — Telehealth: Payer: Self-pay | Admitting: *Deleted

## 2023-07-21 NOTE — Transitions of Care (Post Inpatient/ED Visit) (Signed)
   07/21/2023  Name: Patrick Kidd MRN: [id number] DOB: 05-Apr-1945  Today's TOC FU Call Status: Today's TOC FU Call Status:: Unsuccessful Call (1st Attempt) (Patient answered and requested call back tomorrow: stated home health nurse is currently at his home) Unsuccessful Call (1st Attempt) Date: 07/21/23  Attempted to reach the patient regarding the most recent Inpatient visit.  Patient answered and requested call back tomorrow: stated home health nurse is currently at his home   Follow Up Plan: Additional outreach attempts will be made to reach the patient to complete the Transitions of Care (Post Inpatient/ED visit) call.   Pls call/ message for questions,  Darcy Barbara Mckinney Sameeha Rockefeller, RN, BSN, CCRN Alumnus RN Care Manager  Transitions of Care  [facility] - [facility] [phone]: direct office

## 2023-07-22 ENCOUNTER — Telehealth: Payer: Self-pay | Admitting: *Deleted

## 2023-07-22 ENCOUNTER — Other Ambulatory Visit: Payer: Self-pay | Admitting: Internal Medicine

## 2023-07-22 DIAGNOSIS — K6819 Other retroperitoneal abscess: Secondary | ICD-10-CM

## 2023-07-22 NOTE — Patient Instructions (Addendum)
 Visit Information  Thank you for taking time to visit with me today. Please don't hesitate to contact me if I can be of assistance to you before our next scheduled telephone appointment.  Our next appointment is by telephone on Friday 07/31/23 at 1:00 pm  Please call the care guide team at [phone] if you need to cancel or reschedule your appointment.   Patient Self Care Activities:  Attend all scheduled provider appointments Call provider office for new concerns or questions  Participate in Transition of Care Program/Attend TOC scheduled calls Continue working with the home health team that is involved in your care Continue pacing activity as your recuperation from your recent hospital visit continues Use assistive devices (walker) as needed to prevent falls Continue monitoring and recording your blood sugars at home If you believe your condition is getting worse- contact your care providers (doctors) promptly- reaching out to your doctor early when you have concerns can prevent you from having to go to the hospital  Following is a copy of your care plan:   Goals Addressed             This Visit's Progress    [facility] Transitions of Care (TOC) Care Plan   On track    Problems:  Recent Hospitalization for treatment of SIRS/ intraabdominal- retroperitoneal abscess with drain placement/ IV antibiotics at home Home Health skilled nursing and PT- currently active (2) unplanned hospital admissions x last 12 months; (1)- last 6 months  Goal:  Over the next 30 days, the patient will not experience hospital readmission  Interventions:  Transitions of Care: 07/22/23 week # 1/ day # 1 Durable Medical Equipment (DME) needs assessed with patient/caregiver Doctor Visits  - discussed the importance of doctor visits Communication with PCP re: enrollment into TOC 30-day program Arranged PCP follow-up within 7 days (Care Guide Scheduled) Post discharge activity limitations prescribed by  provider reviewed Post-op wound/incision care reviewed with patient/caregiver Reviewed Signs and symptoms of infection Home Health PT services- confirmed active; encouraged patient's active participation/ engagement: reviewed yesterday's home visit with nurse; confirmed PT expected to visit this afternoon  Provided education around benefit of conservative post-hospital discharge activity; need to pace activity without over-doing  Provided education/ reinforcement around fall prevention  Confirmed currently requiring/ using assistive devices for ambulation - walker: did not use prior to most recent hospitalization Provided education/ reinforcement re: basic care at home of abdominal drain/ PICC line: confirmed no concerns around either Provided education around expected/ usual side effects of antibiotics  Confirmed patient continues monitoring blood sugars at home every day- various times throughout the day Reviewed recent blood sugars at home: patient reports today, haven't checked it today, yesterday afternoon it was 160; he denies concerns around low- or high blood sugars post- recent hospital discharge Reviewed multiple upcoming provider office visits: confirmed patient is aware of all and has plans to attend as scheduled:  07/27/23- PCP for hospital follow up; 07/29/23- GI for hospital follow up; 08/03/23- ID provider for hospital follow up; encouraged patient and spouse not to wait to schedule with IR provider for drain assessment/ possible removal within 14 days- confirmed they have contact information for IR provider team and report they will call to schedule soon Successfully enrolled into 30-day TOC program; provided my direct contact information should questions/ concerns/ needs arise post-TOC call, prior to next RN CM telephone visit    Patient Self Care Activities:  Attend all scheduled provider appointments Call provider office for new concerns or  questions  Participate in Transition  of Care Program/Attend TOC scheduled calls Continue working with the home health team that is involved in your care Continue pacing activity as your recuperation from your recent hospital visit continues Use assistive devices (walker) as needed to prevent falls Continue monitoring and recording your blood sugars at home If you believe your condition is getting worse- contact your care providers (doctors) promptly- reaching out to your doctor early when you have concerns can prevent you from having to go to the hospital  Plan:  Telephone follow up appointment with care management team member scheduled for:  Friday 07/31/23 at 1:00 pm  Plan for next week's call: Review PCP and GI provider office visits 07/27/23 and 07/29/23: (?) medication changes/ lab results Assess medication adherence Review home health PT visits Assess home management of abdominal drain/ PICC line- IV antibiotics Review home blood sugar values from home monitoring Reinforce/ provide education re: signs/ symptoms infection along with action plan        Patient verbalizes understanding of instructions and care plan provided today and agrees to view in MyChart. Active MyChart status and patient understanding of how to access instructions and care plan via MyChart confirmed with patient.     If you are experiencing a Mental Health or Behavioral Health Crisis or need someone to talk to, please  call the Suicide and Crisis Lifeline: 988 call the [place]  National Suicide Prevention Lifeline: 747-038-9179 or TTY: 380 223 6682 TTY (215) 667-1228) to talk to a trained counselor call 1-800-273-TALK (toll free, 24 hour hotline) go to [facility] [address], [place] [id number]) call the [facility] Crisis Line: 867-434-3734 call 911   Erlene Hawks, RN, BSN, CCRN Alumnus RN Care Manager  Transitions of Care  [facility] - Population Health  [place] [phone]: direct  office

## 2023-07-22 NOTE — Transitions of Care (Post Inpatient/ED Visit) (Signed)
 07/22/2023  Name: Patrick Kidd MRN: [id number] DOB: Jun 30, 1945  Today's TOC FU Call Status: Today's TOC FU Call Status:: Successful TOC FU Call Completed TOC FU Call Complete Date: 07/22/23 Patient's Name and Date of Birth confirmed.  Transition Care Management Follow-up Telephone Call Date of Discharge: 07/20/23 Discharge Facility: [facility]) Type of Discharge: Inpatient Admission Primary Inpatient Discharge Diagnosis:: SIRS/ fever/ retroperitoneal abdominal hematoma- abcess with drain placement/ home IV infusions How have you been since you were released from the hospital?: Better (I am doing fine, just weak, using the walker to get around.  I need to get stronger and home health PT is coming today, the nurse came yesterday.  Thanks for getting this appointment scheduled with the nurse practicioner) Any questions or concerns?: No  Items Reviewed: Did you receive and understand the discharge instructions provided?: Yes (thoroughly reviewed with patient and his spouse who verbalizes good understanding of same) Medications obtained,verified, and reconciled?: Yes (Medications Reviewed) (Full medication reconciliation/ review completed; no concerns or discrepancies identified; confirmed patient obtained/ is taking all newly Rx'd medications as instructed; self-manages medications and denies questions/ concerns around medications today) Any new allergies since your discharge?: No Dietary orders reviewed?: Yes Type of Diet Ordered:: Healthy as possible, but pretty much regular Do you have support at home?: Yes People in Home [RPT]: spouse Name of Support/Comfort Primary Source: Reports independent in self-care activities at baseline- requiring minimal assistance post-hospital discharge; supportive spouse assists as/ if needed/ indicated  Medications Reviewed Today: Medications Reviewed Today     Reviewed by Shrihan Putt M, RN (Registered Nurse) on 07/22/23 at 1202  Med List  Status: <None>   Medication Order Taking? Sig Documenting Provider Last Dose Status Informant  ertapenem [facility]) IVPB [id number] Yes Inject 1 g into the vein daily for 19 days. Indication:  RETROPERITONEAL ABSCESS First Dose: Yes Last Day of Therapy:  08/06/23 Labs - Once weekly:  CBC/D and BMP, Labs - Once weekly: ESR and CRP Method of administration: Mini-Bag Plus / Gravity Method of administration may be changed at the discretion of home infusion pharmacist based upon assessment of the patient and/or caregiver's ability to self-administer the medication ordered. Orlie Bjornstad, MD  Active   gabapentin  (NEURONTIN ) 300 MG capsule [id number] Yes Take 1 capsule (300 mg total) by mouth 3 (three) times daily. [name], MD  Active Self, Pharmacy Records  insulin  glargine, 2 Unit Dial , (TOUJEO  MAX SOLOSTAR) 300 UNIT/ML Solostar Pen [id number] Yes Inject 12 Units into the skin daily. Via SANOFI pt assistance [name], MD  Active Self, Pharmacy Records  Insulin  Pen Needle (BD PEN NEEDLE MINI ULTRAFINE) 31G X 5 MM MISC [id number] Yes INJECT 1 ACT INTO THE SKIN DAILY. USE TO ADMINISTER INSULIN . DX E11.9   Strength: 31G X 5 MM [name], MD  Active Self, Pharmacy Records  lidocaine  (LIDODERM ) 5 % [id number] Yes Place 1 patch onto the skin daily. Remove & Discard patch within 12 hours or as directed by MD Rosena Conradi, MD  Active   metFORMIN  (GLUCOPHAGE ) 500 MG tablet [id number] Yes TAKE 1 TABLET BY MOUTH TWICE A DAY WITH FOOD [name], MD  Active Self, Pharmacy Records  Multiple Vitamins-Minerals (CENTRUM SILVER 50+MEN) TABS [id number] Yes Take 1 tablet by mouth daily. [provider]  Active Self, Pharmacy Records  pantoprazole  (PROTONIX ) 40 MG tablet [id number] Yes TAKE 1 TABLET BY MOUTH 2 TIMES DAILY 30 MINUTES BEFORE MEALS (BREAKFAST AND SUPPER) [name], MD  Active  Self, Pharmacy Records  senna-docusate (SENOKOT-S) 8.6-50 MG tablet [id number]  Take 1 tablet by mouth  at bedtime as needed for mild constipation or moderate constipation.  Patient not taking: Reported on 07/22/2023   Pokhrel, Laxman, MD  Active   traMADol  (ULTRAM ) 50 MG tablet [id number] Yes Take 1 tablet (50 mg total) by mouth every 12 (twelve) hours as needed for severe pain (pain score 7-10). Pokhrel, Amador Bad, MD  Active            Home Care and Equipment/Supplies: Were Home Health Services Ordered?: Yes Name of Home Health Agency:: [facility]: RN/ PT  [phone] Has Agency set up a time to come to your home?: Yes First Home Health Visit Date: 07/21/23 Any new equipment or medical supplies ordered?: Yes Name of Medical supply agency?: [employer]- IV infusion equipment Were you able to get the equipment/medical supplies?: Yes Do you have any questions related to the use of the equipment/supplies?: No  Functional Questionnaire: Do you need assistance with bathing/showering or dressing?: Yes (spouse assisting as needed) Do you need assistance with meal preparation?: Yes (spouse assisting as needed) Do you need assistance with eating?: No Do you have difficulty maintaining continence: No Do you need assistance with getting out of bed/getting out of a chair/moving?: No Do you have difficulty managing or taking your medications?: No  Follow up appointments reviewed: PCP Follow-up appointment confirmed?: Yes (care coordination outreach in real-time with scheduling care guide to successfully schedule hospital follow up PCP appointment 07/27/23) Date of PCP follow-up appointment?: 07/27/23 Follow-up Provider: PCP- covering APP: Casimer Clear NP Specialist Hospital Follow-up appointment confirmed?: Yes Date of Specialist follow-up appointment?: 07/29/23 Follow-Up Specialty Provider:: GI provider- Dr. Willy Harvest Do you need transportation to your follow-up appointment?: No Do you understand care options if your condition(s) worsen?: Yes-patient verbalized understanding  SDOH Interventions  Today    Flowsheet Row Most Recent Value  SDOH Interventions   Food Insecurity Interventions Intervention Not Indicated  Housing Interventions Intervention Not Indicated  Transportation Interventions Intervention Not Indicated  [drives self at baseline,  spouse assisting post- hospital discharge]  Utilities Interventions Intervention Not Indicated   Successfully enrolled into 30-day TOC program; provided my direct contact information should questions/ concerns/ needs arise post-TOC call, prior to next RN CM telephone visit    See TOC assessment tabs for additional assessment/ TOC intervention information  Plan for next week's call: Review PCP and GI provider office visits 07/27/23 and 07/29/23: (?) medication changes/ lab results Assess medication adherence Review home health PT visits Assess home management of abdominal drain/ PICC line- IV antibiotics Review home blood sugar values from home monitoring Reinforce/ provide education re: signs/ symptoms infection along with action plan  Pls call/ message for questions,  Makaia Rappa Mckinney Aysha Livecchi, RN, BSN, CCRN Alumnus RN Care Manager  Transitions of Care  [facility] - [facility] [phone]: direct office

## 2023-07-23 ENCOUNTER — Telehealth: Payer: Self-pay | Admitting: Internal Medicine

## 2023-07-23 NOTE — Telephone Encounter (Signed)
 Copied from CRM [id number]. Topic: Clinical - Home Health Verbal Orders >> Jul 22, 2023  4:56 PM DeAngela L wrote: Caller/Agency: Jola Nash Physical Therapist with [facility] home health   Callback Number: [phone] Service Requested: Physical Therapy Frequency: 1w1 2w1 1w6 Any new concerns about the patient? Yes Calling to report chronic loose stools going on for a while

## 2023-07-24 NOTE — Telephone Encounter (Signed)
 Left detailed voice message via secure voice mail, provided ok on verbals attached to previous message

## 2023-07-27 ENCOUNTER — Encounter: Payer: Self-pay | Admitting: Family Medicine

## 2023-07-27 ENCOUNTER — Ambulatory Visit (INDEPENDENT_AMBULATORY_CARE_PROVIDER_SITE_OTHER): Admitting: Family Medicine

## 2023-07-27 ENCOUNTER — Other Ambulatory Visit: Payer: Self-pay | Admitting: *Deleted

## 2023-07-27 VITALS — BP 130/74 | HR 93 | Temp 98.6°F | Ht 74.0 in | Wt 207.8 lb

## 2023-07-27 DIAGNOSIS — R6510 Systemic inflammatory response syndrome (SIRS) of non-infectious origin without acute organ dysfunction: Secondary | ICD-10-CM

## 2023-07-27 DIAGNOSIS — I456 Pre-excitation syndrome: Secondary | ICD-10-CM

## 2023-07-27 DIAGNOSIS — R197 Diarrhea, unspecified: Secondary | ICD-10-CM

## 2023-07-27 DIAGNOSIS — Z794 Long term (current) use of insulin: Secondary | ICD-10-CM | POA: Diagnosis not present

## 2023-07-27 DIAGNOSIS — I739 Peripheral vascular disease, unspecified: Secondary | ICD-10-CM | POA: Diagnosis not present

## 2023-07-27 DIAGNOSIS — Z452 Encounter for adjustment and management of vascular access device: Secondary | ICD-10-CM | POA: Diagnosis not present

## 2023-07-27 DIAGNOSIS — K7682 Hepatic encephalopathy: Secondary | ICD-10-CM

## 2023-07-27 DIAGNOSIS — I480 Paroxysmal atrial fibrillation: Secondary | ICD-10-CM | POA: Diagnosis not present

## 2023-07-27 DIAGNOSIS — C9110 Chronic lymphocytic leukemia of B-cell type not having achieved remission: Secondary | ICD-10-CM

## 2023-07-27 DIAGNOSIS — A4150 Gram-negative sepsis, unspecified: Secondary | ICD-10-CM | POA: Diagnosis not present

## 2023-07-27 DIAGNOSIS — K6819 Other retroperitoneal abscess: Secondary | ICD-10-CM | POA: Diagnosis not present

## 2023-07-27 DIAGNOSIS — E119 Type 2 diabetes mellitus without complications: Secondary | ICD-10-CM | POA: Diagnosis not present

## 2023-07-27 DIAGNOSIS — K7030 Alcoholic cirrhosis of liver without ascites: Secondary | ICD-10-CM | POA: Diagnosis not present

## 2023-07-27 NOTE — Progress Notes (Unsigned)
 Acute Office Visit  Subjective:     Patient ID: Patrick Kidd, male    DOB: 1946-01-14, 78 y.o.   MRN: [id number]  Chief Complaint  Patient presents with   Hospitalization Follow-up    HPI Follow up Hospitalization  Patient was admitted to [facility] on 07/09/2023 and discharged on 07/22/2023. He was treated for sepsis related to retroperitoneal abscess postcholecystectomy. Treatment for this included antibiotics via PICC line, draining abscess and JP drain in place. Telephone follow up was done on 07/22/2023 He reports excellent compliance with treatment. He is accompanied by his wife who is acting as historian today. He reports this condition is improved.  They report runny stools since he has been home from the hospital. Denies current abdominal pain, fevers, chills, other concerns.  Has home health in place, unclear about who is signing orders for this. States that he had blood drawn from PICC line earlier this morning with home health nurse.  Unclear which labs were drawn in which doctor they went to.  He has not been able to see infectious disease yet, has appointment 08/03/2023.  Reports drainage amount has decreased down to about 30 mL/day.  ----------------------------------------------------------------------------------------- [facility] Course:   Sepsis secondary to retroperitoneal hematoma infection. Patient was initially on vancomycin  and cefepime .  Blood cultures negative so far in 5 days.  Status post IR intervention and drainage of 60 cc of purulent fluid.  Patient had drain tube displaced so had to be reinserted by interventional radiology.   Fluid culture now with Klebsiella pneumonia, Citrobacter.  Infectious disease was consulted and at this time plan is IV ertapenem  for 3 weeks.  Status post PICC line placement.  Outpatient antibiotic treatments will be provided on discharge and managed by home health.  Patient will follow-up with ID as outpatient on  08/03/2023   Acute toxic encephalopathy  Likely secondary to narcotics and infection. SABRA  Resolved at this time.   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 Elevated INR Intermittent thrombocytopenia Platelets have improved.  No evidence of bleeding.  Latest platelet of 293     Type 2 diabetes mellitus with complication, with long-term current use of insulin , uncontrolled Patient is on metformin , Toujeo  at home will be resumed on discharge.     Chronic lymphocytic leukemia  Last evaluated in October 2024-did not need treatment at that time   Hypokalemia.  Latest potassium of 3.7.   Hypomagnesemia.  Improved after replacement.  Latest magnesium  1.9.   Debility deconditioning.  Seen by PT OT and recommend home health on discharge.   ROS Per HPI      Objective:    BP 130/74 (BP Location: Left Arm, Patient Position: Sitting)   Pulse 93   Temp 98.6 F (37 C) (Temporal)   Ht 6' 2 (1.88 m)   Wt 207 lb 12.8 oz (94.3 kg)   SpO2 97%   BMI 26.68 kg/m    Physical Exam Vitals and nursing note reviewed.  Constitutional:      General: He is not in acute distress.    Comments: Chronically ill-appearing  HENT:     Head: Normocephalic and atraumatic.     Right Ear: External ear normal.     Left Ear: External ear normal.     Nose: Nose normal.     Mouth/Throat:     Mouth: Mucous membranes are moist.     Pharynx: Oropharynx is clear.   Eyes:     Extraocular Movements: Extraocular movements intact.  Cardiovascular:     Rate and Rhythm: Normal rate. Rhythm irregular.     Pulses: Normal pulses.     Heart sounds: Normal heart sounds.  Pulmonary:     Effort: Pulmonary effort is normal. No respiratory distress.     Breath sounds: Normal breath sounds. No wheezing, rhonchi or rales.  Abdominal:     General: There is no distension.     Palpations: There is no mass.     Tenderness: There is no abdominal tenderness. There is no guarding.     Hernia: No hernia is present.      Comments: Area of JP drain, abdominal binder in place   Musculoskeletal:     Cervical back: Normal range of motion.     Right lower leg: No edema.     Left lower leg: No edema.     Comments: Using 2 wheeled walker, generalized muscle weakness  Lymphadenopathy:     Cervical: No cervical adenopathy.   Skin:    General: Skin is warm and dry.     Coloration: Skin is jaundiced (Mild).     Comments: PICC in place to R upper extremity, non tender, no bleeding, no discharge noted   Neurological:     Mental Status: He is alert and oriented to person, place, and time. Mental status is at baseline.     Comments: At baseline per wife  Psychiatric:        Mood and Affect: Mood normal.        Behavior: Behavior normal.     No results found for any visits on 07/27/23.      Assessment & Plan:   Diarrhea of presumed infectious origin Assessment & Plan: Stool studies ordered today Continue bland diet, discussed fiber and fluid intake  Orders: -     Gastrointestinal Panel by PCR , Stool  Retroperitoneal abscess (HCC) Assessment & Plan: Continue current home PICC line antibiotic regimen Follow-up with infectious disease as scheduled   Alcoholic cirrhosis of liver without ascites (HCC) Assessment & Plan: Was admitted to the hospital with acute metabolic encephalopathy Back to baseline Follow-up with GI as scheduled    Insulin -requiring or dependent type II diabetes mellitus (HCC) Assessment & Plan: Continue current medication regimen Follow-up with PCP as scheduled   Chronic lymphocytic leukemia (HCC) Assessment & Plan: Managed by Dr. Federico with oncology, discussed case with him today via secure chat. Will have PICC line flushed in lab drawn at the cancer center tomorrow    SIRS (systemic inflammatory response syndrome) (HCC) Assessment & Plan: Continue current medication regimen Follow-up with infectious disease as scheduled    PICC (peripherally inserted  central catheter) in place Assessment & Plan: Continue current home PICC line antibiotic regimen Follow-up with infectious disease as scheduled   Peripheral vascular disease (HCC) Assessment & Plan: Stable, continue current med regimen Follow-up cardiology as scheduled   Encephalopathy, hepatic (HCC) Assessment & Plan: Resolving, appears to be back to baseline per wife    WPW (Wolff-Parkinson-White syndrome) Assessment & Plan: Stable, follow-up cardiology as scheduled   Paroxysmal atrial fibrillation (HCC) Assessment & Plan: Stable, continue current med regimen Follow-up with cardiology as scheduled      No orders of the defined types were placed in this encounter.  Spent 60 minutes on this encounter including face-to-face time, documentation, care coordination, therapy options, complex diagnoses and plans.  Discussed case with Dr. Federico with oncology via secure chat.  Will coordinate with infectious disease, oncology, cardiology, GI.  Return  for [facility] specialists  as scheduled, with PCP as scheduled.  Corean LITTIE Ku, FNP

## 2023-07-28 ENCOUNTER — Telehealth: Payer: Self-pay | Admitting: Hematology and Oncology

## 2023-07-28 NOTE — Telephone Encounter (Signed)
 Scheduled appointment per 6/23 secure chat. Talked with the patient and he is aware of the made appointment.

## 2023-07-29 ENCOUNTER — Encounter: Payer: Self-pay | Admitting: Family Medicine

## 2023-07-29 ENCOUNTER — Encounter: Payer: Self-pay | Admitting: Internal Medicine

## 2023-07-29 ENCOUNTER — Ambulatory Visit: Admitting: Internal Medicine

## 2023-07-29 ENCOUNTER — Other Ambulatory Visit

## 2023-07-29 VITALS — BP 108/58 | HR 72 | Ht 74.0 in | Wt 207.0 lb

## 2023-07-29 DIAGNOSIS — K6819 Other retroperitoneal abscess: Secondary | ICD-10-CM

## 2023-07-29 DIAGNOSIS — K7030 Alcoholic cirrhosis of liver without ascites: Secondary | ICD-10-CM

## 2023-07-29 DIAGNOSIS — R197 Diarrhea, unspecified: Secondary | ICD-10-CM | POA: Insufficient documentation

## 2023-07-29 DIAGNOSIS — F1091 Alcohol use, unspecified, in remission: Secondary | ICD-10-CM

## 2023-07-29 DIAGNOSIS — Z452 Encounter for adjustment and management of vascular access device: Secondary | ICD-10-CM | POA: Insufficient documentation

## 2023-07-29 DIAGNOSIS — R195 Other fecal abnormalities: Secondary | ICD-10-CM | POA: Diagnosis not present

## 2023-07-29 NOTE — Assessment & Plan Note (Signed)
 Resolving, appears to be back to baseline per wife

## 2023-07-29 NOTE — Patient Instructions (Signed)
 Your provider has requested that you go to the basement level for lab work before leaving today. Press B on the elevator. The lab is located at the first door on the left as you exit the elevator.  You have been scheduled for an endoscopy. Please follow written instructions given to you at your visit today.  If you use inhalers (even only as needed), please bring them with you on the day of your procedure.  If you take any of the following medications, they will need to be adjusted prior to your procedure:   DO NOT TAKE 7 DAYS PRIOR TO TEST- Trulicity (dulaglutide) Ozempic, Wegovy (semaglutide) Mounjaro (tirzepatide) Bydureon Bcise (exanatide extended release)  DO NOT TAKE 1 DAY PRIOR TO YOUR TEST Rybelsus (semaglutide) Adlyxin (lixisenatide) Victoza (liraglutide) Byetta (exanatide) ___________________________________________________________________________   I appreciate the opportunity to care for you. Lupita Commander, MD, [facility]

## 2023-07-29 NOTE — Assessment & Plan Note (Signed)
 Managed by Dr. Federico with oncology, discussed case with him today via secure chat. Will have PICC line flushed in lab drawn at the cancer center tomorrow

## 2023-07-29 NOTE — Progress Notes (Signed)
 Patrick Kidd 77 y.o. 1945/03/21 [id number]  Assessment & Plan:   Encounter Diagnoses  Name Primary?   Alcoholic cirrhosis, unspecified whether ascites present (HCC) Yes   Loose stools    Retroperitoneal abscess (HCC)    Cirrhosis seems stable.  He is recovering from his retroperitoneal abscess and still on treatment with home antibiotics and percutaneous drain.  He is overdue for variceal screening.  We will schedule that for August when he is stronger.  Loose stools could be a side effect of the antibiotics as opposed to infection.  I think the symptoms started before the antibiotic so could be postcholecystectomy diarrhea.  Consider bile acid sequestrant depending upon clinical course.  GI pathogen panel ordered by primary care.  I have asked the patient and his wife to let me know if that turns up any infection.  Check alpha-fetoprotein today.  He has had multiple CT scans so I do not think he needs cross-sectional imaging for at least 6 months perhaps a year.  Next would repeat ultrasound.  MELD 3.0: 16 at 07/12/2023  6:44 AM MELD-Na: 11 at 07/12/2023  6:44 AM Calculated from: Serum Creatinine: 0.97 mg/dL (Using min of 1 mg/dL) at 04/03/7972  3:55 AM Serum Sodium: 132 mmol/L at 07/12/2023  6:44 AM Total Bilirubin: 1.1 mg/dL at 04/05/7972  4:90 AM Serum Albumin : 2.3 g/dL at 04/05/7972  4:90 AM INR(ratio): 1.4 at 07/12/2023  6:44 AM Age at listing (hypothetical): 46 years Sex: Male at 07/12/2023  6:44 AM  Child Pugh score 7 points class B   CC: Patrick Debby CROME, MD   Subjective:  GI problem summary:   Cirrhosis (EtOH plus or minus NASH) clinical diagnosis 2017 with history of SBP (01/23/2019) and hepatic encephalopathy No history of varices last EGD 9/23-erythematous gastric mucosa not classic for portal hypertensive gastropathy Ascites did resolve Stopped lactulose  2022 Large right hepatic cyst stable Rectal Varices colonoscopy 2021   History of GERD and distal  esophageal stenosis dilated 2017   History of colon polyps-multiple adenomas 2017 2 diminutive polyps 2021 1 adenoma 1 hyperplastic recall suspended due to age   Cholecystitis  - 4/26-30 Admit and Tx abx.  Cholecystectomy 03/26/2023 at [facility].  Retroperitoneal abscess  diagnosed June 2025.         Patrick Kidd 78 y.o. 04-18-1945 [id number]  Assessment & Plan:  Chief Complaint: Follow-up of cirrhosis, recent retroperitoneal abscess and some diarrhea  The patient consented to the use of artificial intelligence scribe  HPI 78 year old man with a history of alcoholic/MASH cirrhosis, SBP, portal hypertension and hepatic encephalopathy ,CLL, and prostate cancer here for follow-up after being diagnosed with a retroperitoneal abscess.  He was seen in our clinic in early June and had a CT scan that showed this and was admitted to the hospital and had a drain placed by IR and is now on IV antibiotics at home and is followed by infectious disease.  This occurred after a cholecystectomy at [facility] in February, that was performed after percutaneous cholecystostomy treatment here last fall.  Wife present and participates in the visit today.  Patient denies alcohol use.   Saw primary care NP July 27, 2023, GI pathogen panel was ordered given complaints of diarrhea.  He is reporting softer loose stools but not watery every other day and sometimes it is urgent and associated with incontinence.  He also has urinary urgency on a regular basis.     Most recent CT scan 07/16/2023-liver with 6 cm right hepatic cyst  but no lesions.  The retroperitoneal and pelvic drain catheter was out of position.      Wt Readings from Last 3 Encounters:  07/29/23 207 lb (93.9 kg)  07/27/23 207 lb 12.8 oz (94.3 kg)  07/18/23 213 lb 13.5 oz (97 kg)      Recent Labs       Lab Results  Component Value Date    WBC 8.0 07/18/2023    HGB 10.2 (L) 07/18/2023    HCT 31.7 (L) 07/18/2023    MCV 93.0 07/18/2023    PLT 293  07/18/2023      Recent Labs       Lab Results  Component Value Date    ALT 16 07/18/2023    AST 23 07/18/2023    ALKPHOS 121 07/18/2023    BILITOT 0.9 07/18/2023      Recent Labs       Lab Results  Component Value Date    INR 1.4 (H) 07/12/2023    INR 1.4 (H) 07/11/2023    INR 1.4 (H) 07/10/2023        Allergies  Allergen Reactions   Rifaximin  Nausea And Vomiting    Required EMS visit, although patient did not go to hospital   Beta Adrenergic Blockers     Likely WPW.  Use with caution   Calcium  Channel Blockers     Likely WPW.  Use with caution.   Current Meds  Medication Sig   ertapenem  (INVANZ ) IVPB Inject 1 g into the vein daily for 19 days. Indication:  RETROPERITONEAL ABSCESS First Dose: Yes Last Day of Therapy:  08/06/23 Labs - Once weekly:  CBC/D and BMP, Labs - Once weekly: ESR and CRP Method of administration: Mini-Bag Plus / Gravity Method of administration may be changed at the discretion of home infusion pharmacist based upon assessment of the patient and/or caregiver's ability to self-administer the medication ordered.   gabapentin  (NEURONTIN ) 300 MG capsule Take 1 capsule (300 mg total) by mouth 3 (three) times daily.   insulin  glargine, 2 Unit Dial , (TOUJEO  MAX SOLOSTAR) 300 UNIT/ML Solostar Pen Inject 12 Units into the skin daily. Via SANOFI pt assistance   Insulin  Pen Needle (BD PEN NEEDLE MINI ULTRAFINE) 31G X 5 MM MISC INJECT 1 ACT INTO THE SKIN DAILY. USE TO ADMINISTER INSULIN . DX E11.9   Strength: 31G X 5 MM   metFORMIN  (GLUCOPHAGE ) 500 MG tablet TAKE 1 TABLET BY MOUTH TWICE A DAY WITH FOOD   Multiple Vitamins-Minerals (CENTRUM SILVER 50+MEN) TABS Take 1 tablet by mouth daily.   pantoprazole  (PROTONIX ) 40 MG tablet TAKE 1 TABLET BY MOUTH 2 TIMES DAILY 30 MINUTES BEFORE MEALS (BREAKFAST AND SUPPER)   traMADol  (ULTRAM ) 50 MG tablet Take 1 tablet (50 mg total) by mouth every 12 (twelve) hours as needed for severe pain (pain score 7-10).   Past Medical  History:  Diagnosis Date   Acrophobia    Alcoholic cirrhosis (HCC) 10/04/2015   Anemia    Arthritis    foot by big toe   BPH associated with nocturia    Cataract    removed both eyes   Cholecystitis 05/2022   tx Abx   Chronic cough    PMH of   CLL (chronic lymphocytic leukemia) (HCC)    COVID-19 12/2018   Diabetes mellitus without complication (HCC)    Diverticulosis 07/03/2010   Colonoscopy.    Duodenal ulcer 2017   Fallen arches    Bilateral   GERD (gastroesophageal reflux disease)  Gout    Granuloma annulare    Hx of adenomatous colonic polyps multiple   Hydrocele 2011   Large septated right hydrocele   Liver cyst    Liver lesion    Nonspecific elevation of levels of transaminase or lactic acid dehydrogenase (LDH)    Obesity    Peripheral neuropathy    Plantar fasciitis    PMH of   Portal hypertension (HCC) 2017   Prostate cancer (HCC)    Right shoulder pain 11/2017   Sleep apnea    no cpap, patient denies   Thiamine  deficiency    Wears reading eyeglasses    WPW (Wolff-Parkinson-White syndrome) 02/05/2019   Past Surgical History:  Procedure Laterality Date   BACK SURGERY  04/28/2022   CATARACT EXTRACTION, BILATERAL  12/2011   Dr Roz   COLONOSCOPY  2017   COLONOSCOPY W/ POLYPECTOMY  07/03/2010   2 adenomas, diverticulosis on right. Dr Avram   CYSTOSCOPY N/A 03/15/2018   Procedure: PHYLLIS SIDE;  Surgeon: Carolee Sherwood JONETTA DOUGLAS, MD;  Location: [facility];  Service: Urology;  Laterality: N/A;  NO SEEDS FOUND IN BLADDER   ESOPHAGOGASTRODUODENOSCOPY (EGD) WITH PROPOFOL  N/A 01/08/2016   Procedure: ESOPHAGOGASTRODUODENOSCOPY (EGD) WITH PROPOFOL ;  Surgeon: Gwendlyn ONEIDA Buddy, MD;  Location: [facility];  Service: Endoscopy;  Laterality: N/A;   FLEXIBLE SIGMOIDOSCOPY  2000   IR EXCHANGE BILIARY DRAIN  02/17/2023   IR PATIENT EVAL TECH 0-60 MINS  12/26/2022   IR PERC CHOLECYSTOSTOMY  12/05/2022   IR PERC CHOLECYSTOSTOMY  12/09/2022   IR  RADIOLOGIST EVAL & MGMT  01/16/2023   RADIOACTIVE SEED IMPLANT N/A 03/15/2018   Procedure: RADIOACTIVE SEED IMPLANT/BRACHYTHERAPY IMPLANT;  Surgeon: Carolee Sherwood JONETTA DOUGLAS, MD;  Location: [facility];  Service: Urology;  Laterality: N/A;   40 SEEDS IMPLANTED   RADIOLOGY WITH ANESTHESIA N/A 12/09/2022   Procedure: IR WITH ANESTHESIA;  Surgeon: Radiologist, Medication, MD;  Location: [facility];  Service: Radiology;  Laterality: N/A;   SIGMOIDOSCOPY     SPACE OAR INSTILLATION N/A 03/15/2018   Procedure: SPACE OAR INSTILLATION;  Surgeon: Carolee Sherwood JONETTA DOUGLAS, MD;  Location: [facility];  Service: Urology;  Laterality: N/A;   WISDOM TOOTH EXTRACTION     Social History   Social History Narrative   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 He lives with his wife in a one level home. He swims most days.   family history includes Leukemia in his father; Lung cancer in his mother.   Review of Systems  As per HPI Objective:   Physical Exam BP 98/82   Pulse 72   Ht 6' 2 (1.88 m)   Wt 207 lb (93.9 kg)   BMI 26.58 kg/m  Recheck BP 108/58 Elserly NAD Weak - needs help standing - using walker Lungs cta corNL S1S2 no murmur Abd obese - drain RLQ - small amount serosanguinous fluid - + spleen tip felt, small periumbilical hernia, assymetry w/ R flank bulge Ext no edema A&O x 3 , no asterixis

## 2023-07-29 NOTE — Assessment & Plan Note (Signed)
 Continue current medication regimen Follow-up with PCP as scheduled

## 2023-07-29 NOTE — Assessment & Plan Note (Signed)
 Was admitted to the hospital with acute metabolic encephalopathy Back to baseline Follow-up with GI as scheduled

## 2023-07-29 NOTE — Assessment & Plan Note (Signed)
 Continue current home PICC line antibiotic regimen Follow-up with infectious disease as scheduled

## 2023-07-29 NOTE — Assessment & Plan Note (Signed)
 Stable, continue current med regimen Follow-up cardiology as scheduled

## 2023-07-29 NOTE — Progress Notes (Signed)
 Patrick Kidd 78 y.o. 22-Jun-1945 [id number]  Assessment & Plan:    aFP  Subjective:  GI problem summary:   Cirrhosis (EtOH plus or minus NASH) clinical diagnosis 2017 with history of SBP (01/23/2019) and hepatic encephalopathy No history of varices last EGD 9/23-erythematous gastric mucosa not classic for portal hypertensive gastropathy Ascites did resolve Stopped lactulose  2022 Large right hepatic cyst stable Rectal Varices colonoscopy 2021   History of GERD and distal esophageal stenosis dilated 2017   History of colon polyps-multiple adenomas 2017 2 diminutive polyps 2021 1 adenoma 1 hyperplastic recall suspended due to age   Cholecystitis  - 4/26-30 Admit and Tx abx.  Cholecystectomy 03/26/2023 at [facility].  Retroperitoneal abscess  diagnosed June 2025.   Chief Complaint:  HPI 78 year old man with a history of alcoholic/MASH cirrhosis, SBP, portal hypertension and hepatic encephalopathy ,CLL, and prostate cancer here for follow-up after being diagnosed with a retroperitoneal abscess.  He was seen in our clinic in early June and had a CT scan that showed this and was admitted to the hospital and had a drain placed by IR and is now on IV antibiotics at home and is followed by infectious disease.  This occurred after a cholecystectomy at [facility] in February, that was performed after percutaneous cholecystostomy treatment here last fall.  Saw ID July 27, 2023, GI pathogen panel was ordered given complaints of diarrhea.   Most recent CT scan 07/16/2023-liver with 6 cm right hepatic cyst but no lesions.  The retroperitoneal and pelvic drain catheter was out of position.  Wt Readings from Last 3 Encounters:  07/29/23 207 lb (93.9 kg)  07/27/23 207 lb 12.8 oz (94.3 kg)  07/18/23 213 lb 13.5 oz (97 kg)    Lab Results  Component Value Date   WBC 8.0 07/18/2023   HGB 10.2 (L) 07/18/2023   HCT 31.7 (L) 07/18/2023   MCV 93.0 07/18/2023   PLT 293 07/18/2023   Lab Results   Component Value Date   ALT 16 07/18/2023   AST 23 07/18/2023   ALKPHOS 121 07/18/2023   BILITOT 0.9 07/18/2023   Lab Results  Component Value Date   INR 1.4 (H) 07/12/2023   INR 1.4 (H) 07/11/2023   INR 1.4 (H) 07/10/2023     Allergies  Allergen Reactions   Rifaximin  Nausea And Vomiting    Required EMS visit, although patient did not go to hospital   Beta Adrenergic Blockers     Likely WPW.  Use with caution   Calcium  Channel Blockers     Likely WPW.  Use with caution.   Current Meds  Medication Sig   ertapenem  (INVANZ ) IVPB Inject 1 g into the vein daily for 19 days. Indication:  RETROPERITONEAL ABSCESS First Dose: Yes Last Day of Therapy:  08/06/23 Labs - Once weekly:  CBC/D and BMP, Labs - Once weekly: ESR and CRP Method of administration: Mini-Bag Plus / Gravity Method of administration may be changed at the discretion of home infusion pharmacist based upon assessment of the patient and/or caregiver's ability to self-administer the medication ordered.   gabapentin  (NEURONTIN ) 300 MG capsule Take 1 capsule (300 mg total) by mouth 3 (three) times daily.   insulin  glargine, 2 Unit Dial , (TOUJEO  MAX SOLOSTAR) 300 UNIT/ML Solostar Pen Inject 12 Units into the skin daily. Via SANOFI pt assistance   Insulin  Pen Needle (BD PEN NEEDLE MINI ULTRAFINE) 31G X 5 MM MISC INJECT 1 ACT INTO THE SKIN DAILY. USE TO ADMINISTER INSULIN . DX E11.9  Strength: 31G X 5 MM   metFORMIN  (GLUCOPHAGE ) 500 MG tablet TAKE 1 TABLET BY MOUTH TWICE A DAY WITH FOOD   Multiple Vitamins-Minerals (CENTRUM SILVER 50+MEN) TABS Take 1 tablet by mouth daily.   pantoprazole  (PROTONIX ) 40 MG tablet TAKE 1 TABLET BY MOUTH 2 TIMES DAILY 30 MINUTES BEFORE MEALS (BREAKFAST AND SUPPER)   traMADol  (ULTRAM ) 50 MG tablet Take 1 tablet (50 mg total) by mouth every 12 (twelve) hours as needed for severe pain (pain score 7-10).   Past Medical History:  Diagnosis Date   Acrophobia    Alcoholic cirrhosis (HCC) 10/04/2015    Anemia    Arthritis    foot by big toe   BPH associated with nocturia    Cataract    removed both eyes   Cholecystitis 05/2022   tx Abx   Chronic cough    PMH of   CLL (chronic lymphocytic leukemia) (HCC)    COVID-19 12/2018   Diabetes mellitus without complication (HCC)    Diverticulosis 07/03/2010   Colonoscopy.    Duodenal ulcer 2017   Fallen arches    Bilateral   GERD (gastroesophageal reflux disease)    Gout    Granuloma annulare    Hx of adenomatous colonic polyps multiple   Hydrocele 2011   Large septated right hydrocele   Liver cyst    Liver lesion    Nonspecific elevation of levels of transaminase or lactic acid dehydrogenase (LDH)    Obesity    Peripheral neuropathy    Plantar fasciitis    PMH of   Portal hypertension (HCC) 2017   Prostate cancer (HCC)    Right shoulder pain 11/2017   Sleep apnea    no cpap, patient denies   Thiamine  deficiency    Wears reading eyeglasses    WPW (Wolff-Parkinson-White syndrome) 02/05/2019   Past Surgical History:  Procedure Laterality Date   BACK SURGERY  04/28/2022   CATARACT EXTRACTION, BILATERAL  12/2011   Dr Roz   CHOLECYSTECTOMY, ROBOT-ASSISTED, LAPAROSCOPIC  03/2023   [facility]   COLONOSCOPY  2017   COLONOSCOPY W/ POLYPECTOMY  07/03/2010   2 adenomas, diverticulosis on right. Dr Avram   CYSTOSCOPY N/A 03/15/2018   Procedure: PHYLLIS SIDE;  Surgeon: Carolee Sherwood JONETTA DOUGLAS, MD;  Location: [facility];  Service: Urology;  Laterality: N/A;  NO SEEDS FOUND IN BLADDER   ESOPHAGOGASTRODUODENOSCOPY (EGD) WITH PROPOFOL  N/A 01/08/2016   Procedure: ESOPHAGOGASTRODUODENOSCOPY (EGD) WITH PROPOFOL ;  Surgeon: Gwendlyn ONEIDA Buddy, MD;  Location: [facility];  Service: Endoscopy;  Laterality: N/A;   FLEXIBLE SIGMOIDOSCOPY  2000   IR EXCHANGE BILIARY DRAIN  02/17/2023   IR PATIENT EVAL TECH 0-60 MINS  12/26/2022   IR PERC CHOLECYSTOSTOMY  12/05/2022   IR PERC CHOLECYSTOSTOMY  12/09/2022   IR RADIOLOGIST EVAL &  MGMT  01/16/2023   RADIOACTIVE SEED IMPLANT N/A 03/15/2018   Procedure: RADIOACTIVE SEED IMPLANT/BRACHYTHERAPY IMPLANT;  Surgeon: Carolee Sherwood JONETTA DOUGLAS, MD;  Location: [facility];  Service: Urology;  Laterality: N/A;   94 SEEDS IMPLANTED   RADIOLOGY WITH ANESTHESIA N/A 12/09/2022   Procedure: IR WITH ANESTHESIA;  Surgeon: Radiologist, Medication, MD;  Location: [facility];  Service: Radiology;  Laterality: N/A;   SIGMOIDOSCOPY     SPACE OAR INSTILLATION N/A 03/15/2018   Procedure: SPACE OAR INSTILLATION;  Surgeon: Carolee Sherwood JONETTA DOUGLAS, MD;  Location: [facility];  Service: Urology;  Laterality: N/A;   WISDOM TOOTH EXTRACTION     Social History  Social History Narrative   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 He lives with his wife in a one level home. He swims most days.   family history includes Leukemia in his father; Lung cancer in his mother.   Review of Systems   Objective:   Physical Exam BP 98/82   Pulse 72   Ht 6' 2 (1.88 m)   Wt 207 lb (93.9 kg)   BMI 26.58 kg/m

## 2023-07-29 NOTE — Assessment & Plan Note (Signed)
 Stable, continue current med regimen Follow-up with cardiology as scheduled

## 2023-07-29 NOTE — Assessment & Plan Note (Signed)
 Stable, follow-up cardiology as scheduled

## 2023-07-29 NOTE — Assessment & Plan Note (Signed)
 Stool studies ordered today Continue bland diet, discussed fiber and fluid intake

## 2023-07-29 NOTE — Assessment & Plan Note (Signed)
 Continue current medication regimen Follow-up with infectious disease as scheduled

## 2023-07-29 NOTE — Patient Instructions (Signed)
 We will be in contact with lab results once they are received.  I have spoken with Dr. Federico today, he would like to have labs drawn at his office tomorrow and have your PICC line flushed.  We are working on finding out who is signing off on home health orders and will be in contact with you to find out if we need extra labs or not.  Please follow-up with specialist as scheduled.  Continue home health regimen with IV antibiotics and physical/occupational therapy.  Follow-up with primary care as scheduled.

## 2023-07-30 ENCOUNTER — Inpatient Hospital Stay: Attending: Hematology and Oncology

## 2023-07-30 DIAGNOSIS — C9110 Chronic lymphocytic leukemia of B-cell type not having achieved remission: Secondary | ICD-10-CM | POA: Insufficient documentation

## 2023-07-30 DIAGNOSIS — Z79899 Other long term (current) drug therapy: Secondary | ICD-10-CM | POA: Insufficient documentation

## 2023-07-30 DIAGNOSIS — D6959 Other secondary thrombocytopenia: Secondary | ICD-10-CM | POA: Diagnosis not present

## 2023-07-30 DIAGNOSIS — Z95828 Presence of other vascular implants and grafts: Secondary | ICD-10-CM | POA: Insufficient documentation

## 2023-07-30 LAB — CBC WITH DIFFERENTIAL (CANCER CENTER ONLY)
Abs Immature Granulocytes: 0.02 10*3/uL (ref 0.00–0.07)
Basophils Absolute: 0 10*3/uL (ref 0.0–0.1)
Basophils Relative: 0 %
Eosinophils Absolute: 0.1 10*3/uL (ref 0.0–0.5)
Eosinophils Relative: 1 %
HCT: 31.1 % — ABNORMAL LOW (ref 39.0–52.0)
Hemoglobin: 10.6 g/dL — ABNORMAL LOW (ref 13.0–17.0)
Immature Granulocytes: 0 %
Lymphocytes Relative: 39 %
Lymphs Abs: 3.1 10*3/uL (ref 0.7–4.0)
MCH: 30.2 pg (ref 26.0–34.0)
MCHC: 34.1 g/dL (ref 30.0–36.0)
MCV: 88.6 fL (ref 80.0–100.0)
Monocytes Absolute: 0.5 10*3/uL (ref 0.1–1.0)
Monocytes Relative: 7 %
Neutro Abs: 4.1 10*3/uL (ref 1.7–7.7)
Neutrophils Relative %: 53 %
Platelet Count: 150 10*3/uL (ref 150–400)
RBC: 3.51 MIL/uL — ABNORMAL LOW (ref 4.22–5.81)
RDW: 14.7 % (ref 11.5–15.5)
WBC Count: 7.8 10*3/uL (ref 4.0–10.5)
nRBC: 0 % (ref 0.0–0.2)

## 2023-07-30 LAB — CMP (CANCER CENTER ONLY)
ALT: 11 U/L (ref 0–44)
AST: 20 U/L (ref 15–41)
Albumin: 3.4 g/dL — ABNORMAL LOW (ref 3.5–5.0)
Alkaline Phosphatase: 117 U/L (ref 38–126)
Anion gap: 6 (ref 5–15)
BUN: 19 mg/dL (ref 8–23)
CO2: 27 mmol/L (ref 22–32)
Calcium: 8.7 mg/dL — ABNORMAL LOW (ref 8.9–10.3)
Chloride: 107 mmol/L (ref 98–111)
Creatinine: 0.94 mg/dL (ref 0.61–1.24)
GFR, Estimated: >60 mL/min (ref >60)
Glucose, Bld: 166 mg/dL — ABNORMAL HIGH (ref 70–99)
Potassium: 4.3 mmol/L (ref 3.5–5.1)
Sodium: 140 mmol/L (ref 135–145)
Total Bilirubin: 0.6 mg/dL (ref 0.0–1.2)
Total Protein: 6.1 g/dL — ABNORMAL LOW (ref 6.5–8.1)

## 2023-07-30 LAB — MAGNESIUM: Magnesium: 1.5 mg/dL — ABNORMAL LOW (ref 1.7–2.4)

## 2023-07-30 LAB — AFP TUMOR MARKER: AFP-Tumor Marker: 2.4 ng/mL (ref <6.1)

## 2023-07-30 MED ORDER — HEPARIN SOD (PORK) LOCK FLUSH 100 UNIT/ML IV SOLN
500.0000 [IU] | Freq: Once | INTRAVENOUS | Status: AC
  Administered 2023-07-30: 500 [IU]

## 2023-07-30 MED ORDER — SODIUM CHLORIDE 0.9% FLUSH
10.0000 mL | Freq: Once | INTRAVENOUS | Status: AC
  Administered 2023-07-30: 10 mL

## 2023-07-31 ENCOUNTER — Other Ambulatory Visit: Payer: Self-pay | Admitting: *Deleted

## 2023-07-31 ENCOUNTER — Ambulatory Visit
Admission: RE | Admit: 2023-07-31 | Discharge: 2023-07-31 | Disposition: A | Discharge status: Home / Self Care | Source: Ambulatory Visit | Attending: Internal Medicine

## 2023-07-31 ENCOUNTER — Ambulatory Visit
Admission: RE | Admit: 2023-07-31 | Discharge: 2023-07-31 | Disposition: A | Discharge status: Home / Self Care | Source: Ambulatory Visit | Attending: Radiology | Admitting: Radiology

## 2023-07-31 ENCOUNTER — Telehealth: Payer: Self-pay | Admitting: Internal Medicine

## 2023-07-31 ENCOUNTER — Other Ambulatory Visit: Payer: Self-pay | Admitting: Internal Medicine

## 2023-07-31 ENCOUNTER — Ambulatory Visit
Admission: RE | Admit: 2023-07-31 | Discharge: 2023-07-31 | Disposition: A | Discharge status: Home / Self Care | Source: Ambulatory Visit | Attending: Internal Medicine | Admitting: Internal Medicine

## 2023-07-31 ENCOUNTER — Other Ambulatory Visit

## 2023-07-31 ENCOUNTER — Ambulatory Visit: Payer: Self-pay | Admitting: Internal Medicine

## 2023-07-31 DIAGNOSIS — K7460 Unspecified cirrhosis of liver: Secondary | ICD-10-CM | POA: Diagnosis not present

## 2023-07-31 DIAGNOSIS — C61 Malignant neoplasm of prostate: Secondary | ICD-10-CM | POA: Diagnosis not present

## 2023-07-31 DIAGNOSIS — K6819 Other retroperitoneal abscess: Secondary | ICD-10-CM

## 2023-07-31 DIAGNOSIS — K5730 Diverticulosis of large intestine without perforation or abscess without bleeding: Secondary | ICD-10-CM | POA: Diagnosis not present

## 2023-07-31 HISTORY — PX: IR RADIOLOGIST EVAL & MGMT: IMG5224

## 2023-07-31 MED ORDER — IOPAMIDOL (ISOVUE-300) INJECTION 61%
100.0000 mL | Freq: Once | INTRAVENOUS | Status: AC | PRN
  Administered 2023-07-31: 100 mL via INTRAVENOUS

## 2023-07-31 NOTE — Telephone Encounter (Signed)
 Unable to reach patient. LMTRC

## 2023-07-31 NOTE — Progress Notes (Signed)
 Referring Physician(s): Singh,Mayanka  Chief Complaint: The patient is seen in follow up today s/p retroperitoneal abscess  History of present illness: Patrick Kidd is a 78 year old man with a history of alcoholic/MASH cirrhosis, SBP, portal hypertension and hepatic encephalopathy ,CLL, and prostate cancer known to IR for multiple interventions stemming from cholecystitis which occurred in Nov 2024.  He underwent percutaneous cholecystostomy tube placement 12/09/23 which was ultimately removed intra-operatively at [facility] in February 2025 during cholecystectomy.  He subsequently presented to his GI office for follow-up with complaint of back at which time a CT Abdomen Pelvis showed a large retroperitoneal abscess. He then underwent abscess drain placement 07/13/23 which was exchanged at [facility] 07/16/23.  He now presents for follow-up to [facility] for drain management.   Patient presents accompanied by his wife today.  She has been provided all drain and IV abx related care at home.  She does have a drain log which shows output trending decreasing from 50mL per day to 20 mL per day.  She has been flushing once daily regularly. Patient is overall improved since discharge.  No recurrent fever, chills, abdominal pain.  He has abx coverage through 7/3.  He also has a follow-up appt with ID early next week.   Past Medical History:  Diagnosis Date   Acrophobia    Alcoholic cirrhosis (HCC) 10/04/2015   Anemia    Arthritis    foot by big toe   BPH associated with nocturia    Cataract    removed both eyes   Cholecystitis 05/2022   tx Abx   Chronic cough    PMH of   CLL (chronic lymphocytic leukemia) (HCC)    COVID-19 12/2018   Diabetes mellitus without complication (HCC)    Diverticulosis 07/03/2010   Colonoscopy.    Duodenal ulcer 2017   Fallen arches    Bilateral   GERD (gastroesophageal reflux disease)    Gout    Granuloma annulare    Hx of adenomatous colonic polyps multiple    Hydrocele 2011   Large septated right hydrocele   Liver cyst    Liver lesion    Nonspecific elevation of levels of transaminase or lactic acid dehydrogenase (LDH)    Obesity    Peripheral neuropathy    Plantar fasciitis    PMH of   Portal hypertension (HCC) 2017   Prostate cancer (HCC)    Right shoulder pain 11/2017   Sleep apnea    no cpap, patient denies   Thiamine  deficiency    Wears reading eyeglasses    WPW (Wolff-Parkinson-White syndrome) 02/05/2019    Past Surgical History:  Procedure Laterality Date   BACK SURGERY  04/28/2022   CATARACT EXTRACTION, BILATERAL  12/2011   Dr Roz   CHOLECYSTECTOMY, ROBOT-ASSISTED, LAPAROSCOPIC  03/2023   [facility]   COLONOSCOPY  2017   COLONOSCOPY W/ POLYPECTOMY  07/03/2010   2 adenomas, diverticulosis on right. Dr Avram   CYSTOSCOPY N/A 03/15/2018   Procedure: PHYLLIS SIDE;  Surgeon: Carolee Sherwood JONETTA DOUGLAS, MD;  Location: [facility];  Service: Urology;  Laterality: N/A;  NO SEEDS FOUND IN BLADDER   ESOPHAGOGASTRODUODENOSCOPY (EGD) WITH PROPOFOL  N/A 01/08/2016   Procedure: ESOPHAGOGASTRODUODENOSCOPY (EGD) WITH PROPOFOL ;  Surgeon: Gwendlyn ONEIDA Buddy, MD;  Location: [facility];  Service: Endoscopy;  Laterality: N/A;   FLEXIBLE SIGMOIDOSCOPY  2000   IR EXCHANGE BILIARY DRAIN  02/17/2023   IR PATIENT EVAL TECH 0-60 MINS  12/26/2022   IR PERC CHOLECYSTOSTOMY  12/05/2022  IR PERC CHOLECYSTOSTOMY  12/09/2022   IR RADIOLOGIST EVAL & MGMT  01/16/2023   RADIOACTIVE SEED IMPLANT N/A 03/15/2018   Procedure: RADIOACTIVE SEED IMPLANT/BRACHYTHERAPY IMPLANT;  Surgeon: Carolee Sherwood JONETTA DOUGLAS, MD;  Location: [facility];  Service: Urology;  Laterality: N/A;   68 SEEDS IMPLANTED   RADIOLOGY WITH ANESTHESIA N/A 12/09/2022   Procedure: IR WITH ANESTHESIA;  Surgeon: Radiologist, Medication, MD;  Location: [facility];  Service: Radiology;  Laterality: N/A;   SIGMOIDOSCOPY     SPACE OAR INSTILLATION N/A 03/15/2018   Procedure: SPACE  OAR INSTILLATION;  Surgeon: Carolee Sherwood JONETTA DOUGLAS, MD;  Location: [facility];  Service: Urology;  Laterality: N/A;   WISDOM TOOTH EXTRACTION      Allergies: Rifaximin , Beta adrenergic blockers, and Calcium  channel blockers  Medications: Prior to Admission medications   Medication Sig Start Date End Date Taking? Authorizing Provider  ertapenem  (INVANZ ) IVPB Inject 1 g into the vein daily for 19 days. Indication:  RETROPERITONEAL ABSCESS First Dose: Yes Last Day of Therapy:  08/06/23 Labs - Once weekly:  CBC/D and BMP, Labs - Once weekly: ESR and CRP Method of administration: Mini-Bag Plus / Gravity Method of administration may be changed at the discretion of home infusion pharmacist based upon assessment of the patient and/or caregiver's ability to self-administer the medication ordered. 07/18/23 08/06/23  Dennise Kingsley, MD  gabapentin  (NEURONTIN ) 300 MG capsule Take 1 capsule (300 mg total) by mouth 3 (three) times daily. 05/01/23   Joshua Debby CROME, MD  insulin  glargine, 2 Unit Dial , (TOUJEO  MAX SOLOSTAR) 300 UNIT/ML Solostar Pen Inject 12 Units into the skin daily. Via SANOFI pt assistance 05/01/23   Joshua Debby CROME, MD  Insulin  Pen Needle (BD PEN NEEDLE MINI ULTRAFINE) 31G X 5 MM MISC INJECT 1 ACT INTO THE SKIN DAILY. USE TO ADMINISTER INSULIN . DX E11.9   Strength: 31G X 5 MM 06/30/23   Joshua Debby CROME, MD  metFORMIN  (GLUCOPHAGE ) 500 MG tablet TAKE 1 TABLET BY MOUTH TWICE A DAY WITH FOOD 04/23/23   Joshua Debby CROME, MD  Multiple Vitamins-Minerals (CENTRUM SILVER 50+MEN) TABS Take 1 tablet by mouth daily.    [provider]  pantoprazole  (PROTONIX ) 40 MG tablet TAKE 1 TABLET BY MOUTH 2 TIMES DAILY 30 MINUTES BEFORE MEALS (BREAKFAST AND SUPPER) 06/30/23   Joshua Debby CROME, MD  traMADol  (ULTRAM ) 50 MG tablet Take 1 tablet (50 mg total) by mouth every 12 (twelve) hours as needed for severe pain (pain score 7-10). 07/20/23   Pokhrel, Vernal, MD     Family History  Problem Relation  Age of Onset   Lung cancer Mother        smoker   Leukemia Father        Acute myelocytic   Diabetes Neg Hx    Stroke Neg Hx    Heart disease Neg Hx    Colon cancer Neg Hx    Colon polyps Neg Hx    Esophageal cancer Neg Hx    Rectal cancer Neg Hx    Stomach cancer Neg Hx     Social History   Socioeconomic History   Marital status: Married    Spouse name: [name]   Number of children: 2   Years of education: Not on file   Highest education level: Some college, no degree  Occupational History   Occupation: Multimedia programmer estate  Tobacco Use   Smoking status: Former    Current packs/day: 0.00    Average packs/day: 2.0  packs/day for 6.0 years (12.0 ttl pk-yrs)    Types: Cigarettes    Start date: 02/03/1965    Quit date: 02/04/1971    Years since quitting: 52.5    Passive exposure: Never   Smokeless tobacco: Never   Tobacco comments:    smoked age [age], up to 2 ppd  Vaping Use   Vaping status: Never Used  Substance and Sexual Activity   Alcohol use: Not Currently    Comment: No alochol since Christmas Day 2020   Drug use: No   Sexual activity: Yes    Partners: Female  Other Topics Concern   Not on file  Social History Narrative   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 He lives with his wife in a one level home. He swims most days.   Social Drivers of Corporate investment banker Strain: Low Risk  (03/21/2023)   Received from [facility]   Overall Financial Resource Strain (CARDIA)    Difficulty of Paying Living Expenses: Not very hard  Food Insecurity: No Food Insecurity (07/22/2023)   Hunger Vital Sign    Worried About Running Out of Food in the Last Year: Never true    Ran Out of Food in the Last Year: Never true  Transportation Needs: No Transportation Needs (07/22/2023)   PRAPARE -  Administrator, Civil Service (Medical): No    Lack of Transportation (Non-Medical): No  Physical Activity: Inactive (12/02/2021)   Exercise Vital Sign    Days of Exercise per Week: 0 days    Minutes of Exercise per Session: 0 min  Stress: No Stress Concern Present (12/02/2021)   [employer] of Occupational Health - Occupational Stress Questionnaire    Feeling of Stress : Not at all  Social Connections: Moderately Integrated (12/02/2021)   Social Connection and Isolation Panel    Frequency of Communication with Friends and Family: More than three times a week    Frequency of Social Gatherings with Friends and Family: More than three times a week    Attends Religious Services: Never    Database administrator or Organizations: Yes    Attends Engineer, structural: More than 4 times per year    Marital Status: Married     Vital Signs: There were no vitals taken for this visit.  Physical Exam Vitals and nursing note reviewed.   NAD, alert Abdomen: soft, non-tender.  Right flank drain in place with 5-10 mL thin, serous output in bulb.   Imaging: No results found.  Labs:  CBC: Recent Labs    07/15/23 1156 07/16/23 0618 07/18/23 0611 07/30/23 1527  WBC 11.9* 14.7* 8.0 7.8  HGB 9.9* 9.5* 10.2* 10.6*  HCT 30.0* 30.0* 31.7* 31.1*  PLT 229 253 293 150    COAGS: Recent Labs    12/03/22 1850 12/08/22 0834 07/09/23 0954 07/10/23 0509 07/11/23 0738 07/12/23 0644  INR 1.5*   < > 1.5* 1.4* 1.4* 1.4*  APTT 32  --   --   --   --   --    < > = values in this interval not displayed.    BMP: Recent Labs    07/15/23 1156 07/16/23 0618 07/18/23 0611 07/30/23 1527  NA 134* 134* 135 140  K 3.5 3.4* 3.7 4.3  CL 99 99 98 107  CO2 27 24 28 27   GLUCOSE 184* 122* 131* 166*  BUN 17 16 14 19   CALCIUM  8.6* 8.6* 8.5* 8.7*  CREATININE 0.81 0.85 0.82 0.94  GFRNONAA >60 >60 >60 >60    LIVER FUNCTION TESTS: Recent Labs    07/10/23 0509  07/15/23 1156 07/18/23 0611 07/30/23 1527  BILITOT 1.1 1.0 0.9 0.6  AST 14* 18 23 20   ALT 12 15 16 11   ALKPHOS 94 103 121 117  PROT 5.8* 5.8* 6.1* 6.1*  ALBUMIN  2.3* 2.1* 2.2* 3.4*    Assessment: Right retroperitoneal abscess s/p drain placement 07/13/23 by Dr. Hughes with exchange and upsize to 14 Fr drain 07/16/23.  Patient presents today for evaluation of his right retroperitoneal drain.  He has continued to recover well at home with ongoing IV abx via PICC.  His drain output has thinned, now yellow and serous in nature.  Output has declined from 50 mL per day to 20 mL per day with regular flushes at home.  CT Abdomen Pelvis shows improvement without residual collection. Drain remain in place. No drain injection indicated.  Discussed with patient and his wife. Based on output trend, will continue drain at this time but discontinue flushes.  Will plan for reassessment with drain output evaluation only at the end of next week (around 7/3). No imaging required.  Wife verbalizes understanding.   Signed: Donyelle Enyeart Sue-Ellen Jeana Kersting, PA 07/31/2023, 11:03 AM   Please refer to Dr. Jennefer attestation of this note for management and plan.

## 2023-07-31 NOTE — Telephone Encounter (Signed)
 Copied from CRM [id number]. Topic: General - Other >> Jul 31, 2023  1:21 PM Adrionna Y wrote: Reason for CRM: Patients wife is calling because she needs to pick up another stool kit/ The patient accidentally urinated in the cup making I not usable

## 2023-07-31 NOTE — Transitions of Care (Post Inpatient/ED Visit) (Signed)
 Transition of Care week 2/ day # 9  Visit Note  07/31/2023  Name: Patrick Kidd MRN: [id number]          DOB: [birth year]  Situation: Patient enrolled in [facility] 30-day program. Visit completed with patient by telephone.   HIPAA identifiers x 2 verified  Background:  Recent hospitalization June 5-26, 2025 for  SIRS/ fever- retroperitoneal hematoma with abscess- discharged with abdominal drain placement/ IV antibiotic infusion (2) unplanned hospital admissions x last 12 months; (1) x last 6 months  Initial Transition Care Management Follow-up Telephone Call    Past Medical History:  Diagnosis Date   Acrophobia    Alcoholic cirrhosis (HCC) 10/04/2015   Anemia    Arthritis    foot by big toe   BPH associated with nocturia    Cataract    removed both eyes   Cholecystitis 05/2022   tx Abx   Chronic cough    PMH of   CLL (chronic lymphocytic leukemia) (HCC)    COVID-19 12/2018   Diabetes mellitus without complication (HCC)    Diverticulosis 07/03/2010   Colonoscopy.    Duodenal ulcer 2017   Fallen arches    Bilateral   GERD (gastroesophageal reflux disease)    Gout    Granuloma annulare    Hx of adenomatous colonic polyps multiple   Hydrocele 2011   Large septated right hydrocele   Liver cyst    Liver lesion    Nonspecific elevation of levels of transaminase or lactic acid dehydrogenase (LDH)    Obesity    Peripheral neuropathy    Plantar fasciitis    PMH of   Portal hypertension (HCC) 2017   Prostate cancer (HCC)    Right shoulder pain 11/2017   Sleep apnea    no cpap, patient denies   Thiamine  deficiency    Wears reading eyeglasses    WPW (Wolff-Parkinson-White syndrome) 02/05/2019   Assessment:  I have had a busy day; just got home from IR and the home health PT is coming in a few minutes.  I am doing fine, just tired after all this activity;  I am getting stronger every day, not using the walker or cane any more; and my wife is doing a great job giving me  the IV antibiotics and emptying the drain.  Everything is going okay.  I will be finished with the antibiotics on Monday and the PICC line and drain come out on Wednesday;   Denies clinical concerns and sounds to be in no distress throughout [facility] 30-day program outreach call today  Patient Reported Symptoms: Cognitive Cognitive Status: Alert and oriented to person, place, and time, Insightful and able to interpret abstract concepts, Normal speech and language skills Cognitive/Intellectual Conditions Management [RPT]: None reported or documented in medical history or problem list   Health Maintenance Behaviors: Annual physical exam, Healthy diet  Neurological Neurological Review of Symptoms: No symptoms reported    HEENT HEENT Symptoms Reported: No symptoms reported      Cardiovascular Cardiovascular Symptoms Reported: No symptoms reported Does patient have uncontrolled Hypertension?: No Cardiovascular Conditions: Hypertension Cardiovascular Management Strategies: Coping strategies, Adequate rest, Routine screening  Respiratory Respiratory Symptoms Reported: No symptoms reported Other Respiratory Symptoms: Denies shortness of breath/ cough; sounds to be in no respiratory distress throughout TOC call Respiratory Conditions: Sleep disordered breathing  Endocrine Patient reports the following symptoms related to hypoglycemia or hyperglycemia : No symptoms reported Is patient diabetic?: Yes Is patient checking blood sugars at home?: Yes Endocrine  Conditions: Diabetes Endocrine Management Strategies: Diet modification, Routine screening, Medication therapy, Coping strategies, Medical device  Gastrointestinal Gastrointestinal Symptoms Reported: No symptoms reported Other Gastrointestinal Symptoms: Reports my bowle movements are no longer loose- they are getting firmed up- the GI doctor did not change anything; my appetite is good and I am eating well  Confirms he and spouse continue managing  intrabdominal drain without issues/ concerns: confirms got good report from GI provider at time of hospital follow up office visit 07/29/23 Additional Gastrointestinal Details: Abdominal drain remains in place- reports managing well at home, not a lot of drainage, but a little bit; scheduled to be removed on 08/05/23 Gastrointestinal Management Strategies: Coping strategies, Medication therapy Gastrointestinal Comment: Reviewed GI provider office visit from 07/29/23- confirmed no medication changes/ no changes to overall plan of care    Genitourinary Genitourinary Symptoms Reported: No symptoms reported Additional Genitourinary Details: Continues to report peeing just fine, no problems Genitourinary Conditions: Prostate cancer Genitourinary Management Strategies: Coping strategies  Integumentary Integumentary Symptoms Reported: Other Other Integumentary Symptoms: Confirms PICC line remains in place: it looks fine, and Patrick Kidd is not having any problems admministering the IV antibiotics; reports PICC line to be removed on Wednesday 08/05/23 same time they remove the drain; antibiotics will be finished on Monday 08/03/23  Reinforced signs/ symptoms infection/ action plan for same/ basic monitoring-management of PICC line at home Skin Conditions: Other Other Skin Conditions: PICC line as above Skin Management Strategies: Medication therapy, Medical device, Coping strategies, Routine screening, Dressing changes  Musculoskeletal Musculoskelatal Symptoms Reviewed: Weakness Additional Musculoskeletal Details: confirmed not currently requiring/ using assistive devices for ambulation - reports I am feeling stronger every day; still a little weak feeling, but not having to use the walker any more; confirms home health PT remains active and has scheduled home visit later this afternoon: reports he is actively participating Musculoskeletal Management Strategies: Routine screening, Coping strategies       Psychosocial Psychosocial Symptoms Reported: No symptoms reported         There were no vitals filed for this visit.  Medications Reviewed Today     Reviewed by Syerra Abdelrahman M, RN (Registered Nurse) on 07/31/23 at 1310  Med List Status: <None>   Medication Order Taking? Sig Documenting Provider Last Dose Status Informant  ertapenem  (INVANZ ) IVPB [id number]  Inject 1 g into the vein daily for 19 days. Indication:  RETROPERITONEAL ABSCESS First Dose: Yes Last Day of Therapy:  08/06/23 Labs - Once weekly:  CBC/D and BMP, Labs - Once weekly: ESR and CRP Method of administration: Mini-Bag Plus / Gravity Method of administration may be changed at the discretion of home infusion pharmacist based upon assessment of the patient and/or caregiver's ability to self-administer the medication ordered. Dennise Kingsley, MD  Active   gabapentin  (NEURONTIN ) 300 MG capsule [id number]  Take 1 capsule (300 mg total) by mouth 3 (three) times daily. Joshua Debby CROME, MD  Active Self, Pharmacy Records  insulin  glargine, 2 Unit Dial , (TOUJEO  MAX SOLOSTAR) 300 UNIT/ML Solostar Pen [id number]  Inject 12 Units into the skin daily. Via SANOFI pt assistance Joshua Debby CROME, MD  Active Self, Pharmacy Records  Insulin  Pen Needle (BD PEN NEEDLE MINI ULTRAFINE) 31G X 5 MM MISC [id number]  INJECT 1 ACT INTO THE SKIN DAILY. USE TO ADMINISTER INSULIN . DX E11.9   Strength: 31G X 5 MM Joshua Debby CROME, MD  Active Self, Pharmacy Records  metFORMIN  (GLUCOPHAGE ) 500 MG tablet [id number]  TAKE 1 TABLET BY MOUTH TWICE A DAY  WITH FOOD Joshua Debby CROME, MD  Active Self, Pharmacy Records  Multiple Vitamins-Minerals (CENTRUM SILVER 50+MEN) TABS [id number]  Take 1 tablet by mouth daily. [provider]  Active Self, Pharmacy Records  pantoprazole  (PROTONIX ) 40 MG tablet [id number]  TAKE 1 TABLET BY MOUTH 2 TIMES DAILY 30 MINUTES BEFORE MEALS (BREAKFAST AND SUPPER) Joshua Debby CROME, MD  Active Self, Pharmacy Records  traMADol  (ULTRAM ) 50 MG  tablet [id number]  Take 1 tablet (50 mg total) by mouth every 12 (twelve) hours as needed for severe pain (pain score 7-10). Sonjia Held, MD  Active            Recommendation:   Specialty provider follow-up- as schedule: ID 08/03/23; oncology 08/04/23; IR 08/05/23 Continue Current Plan of Care  Follow Up Plan:   Telephone follow-up in 1 week  Plan for next week's call: Review ID/ oncology/ IR provider office visits: (?) medication changes/ lab results, PICC/ drain removed ? Assess medication adherence Review home health PT visits Assess home management of abdominal drain/ PICC line- ensure IV antibiotics completed Monday 08/03/23 Review home blood sugar values from home monitoring Reinforce/ provide education re: signs/ symptoms infection along with action plan  Pls call/ message for questions,  Santos Sollenberger Mckinney Cathalina Barcia, RN, BSN, CCRN Alumnus RN Care Manager  Transitions of Care  [facility] - [facility] [phone]: direct office

## 2023-07-31 NOTE — Patient Instructions (Signed)
 Visit Information  Thank you for taking time to visit with me today. Please don't hesitate to contact me if I can be of assistance to you before our next scheduled telephone appointment.  Our next appointment is by telephone on Thursday, 08/06/23 at 1:00 pm  Please call the care guide team at [phone] if you need to cancel or reschedule your appointment.   Following are the goals we discussed today:  Patient Self Care Activities:  Attend all scheduled provider appointments Call provider office for new concerns or questions  Participate in Transition of Care Program/Attend TOC scheduled calls Continue working with the home health team that is involved in your care Continue pacing activity as your recuperation from your recent hospital visit continues Use assistive devices (walker) as needed to prevent falls Continue monitoring and recording your blood sugars at home If you believe your condition is getting worse- contact your care providers (doctors) promptly- reaching out to your doctor early when you have concerns can prevent you from having to go to the hospital  If you are experiencing a Mental Health or Behavioral Health Crisis or need someone to talk to, please  call the Suicide and Crisis Lifeline: 988 call the [place]  National Suicide Prevention Lifeline: 216-551-0448 or TTY: 984 003 1267 TTY 564-734-5194) to talk to a trained counselor call 1-800-273-TALK (toll free, 24 hour hotline) go to [facility] [address] [place] [phone]) call the [facility] Crisis Line: [phone] call 911   Patient verbalizes understanding of instructions and care plan provided today and agrees to view in MyChart. Active MyChart status and patient understanding of how to access instructions and care plan via MyChart confirmed with patient.     Pls call/ message for questions,  Khilynn Borntreger Mckinney Skylin Kennerson, RN, BSN, CCRN Alumnus RN Care Manager   Transitions of Care  [facility] - [facility] [phone]: direct office

## 2023-08-02 ENCOUNTER — Encounter: Payer: Self-pay | Admitting: Hematology and Oncology

## 2023-08-03 ENCOUNTER — Ambulatory Visit (INDEPENDENT_AMBULATORY_CARE_PROVIDER_SITE_OTHER): Payer: Self-pay | Admitting: Internal Medicine

## 2023-08-03 ENCOUNTER — Other Ambulatory Visit: Payer: Self-pay | Admitting: Physician Assistant

## 2023-08-03 ENCOUNTER — Encounter: Payer: Self-pay | Admitting: Internal Medicine

## 2023-08-03 ENCOUNTER — Telehealth: Payer: Self-pay

## 2023-08-03 ENCOUNTER — Other Ambulatory Visit: Payer: Self-pay

## 2023-08-03 VITALS — BP 135/79 | HR 86 | Temp 97.7°F | Ht 74.0 in | Wt 208.0 lb

## 2023-08-03 DIAGNOSIS — C9110 Chronic lymphocytic leukemia of B-cell type not having achieved remission: Secondary | ICD-10-CM

## 2023-08-03 DIAGNOSIS — K6819 Other retroperitoneal abscess: Secondary | ICD-10-CM

## 2023-08-03 NOTE — Telephone Encounter (Signed)
 Per Dr. Dennise patient end date is on 7/2 pending his appointment with IR on 7/2. If they take out his drain its ok to pull picc on 7/2 @ 11:30 but, if not his IV abx will need to be extended. Patient will call if drain is not taken out. Message sent to [employer] about plan as well.

## 2023-08-03 NOTE — Progress Notes (Unsigned)
 Patient Active Problem List   Diagnosis Date Noted  . Port-A-Cath in place 07/30/2023  . Diarrhea of presumed infectious origin 07/29/2023  . Retroperitoneal abscess (HCC) 07/29/2023  . PICC (peripherally inserted central catheter) in place 07/29/2023  . SIRS (systemic inflammatory response syndrome) (HCC) 07/09/2023  . Hematoma 07/09/2023  . OSA (obstructive sleep apnea) 03/17/2023  . Thrombocytopenia (HCC) 03/17/2023  . Urinary incontinence 06/25/2021  . Degenerative lumbar spinal stenosis 04/30/2021  . Degenerative arthritis of knee, bilateral 03/27/2021  . Chronic lymphocytic leukemia (HCC) 11/08/2020  . Insulin -requiring or dependent type II diabetes mellitus (HCC) 09/25/2020  . Thiamine  deficiency   . Paroxysmal atrial fibrillation (HCC) 07/19/2019  . Allergic rhinitis 06/09/2019  . Erectile dysfunction due to diabetes mellitus (HCC) 06/09/2019  . Overweight with body mass index (BMI) of 28 to 28.9 in adult 03/17/2019  . WPW (Wolff-Parkinson-White syndrome) 02/05/2019  . Encephalopathy, hepatic (HCC)   . Degenerative disc disease, cervical 07/21/2018  . Cervical radiculopathy 07/16/2018  . OAB (overactive bladder) 07/06/2018  . Malignant neoplasm of prostate (HCC) 01/13/2018  . Hyperlipidemia LDL goal <100 11/11/2017  . Essential hypertension 11/11/2017  . BPH associated with nocturia 11/11/2017  . Erectile dysfunction due to arterial insufficiency 04/07/2017  . Peripheral vascular disease (HCC) 06/25/2016  . Alcoholic cirrhosis (HCC) 10/04/2015  . Type 2 diabetes mellitus with complication, with long-term current use of insulin  (HCC) 08/22/2015  . Hypersomnolence 03/19/2014  . Peripheral neuropathy 01/11/2013  . Polyclonal gammopathy 01/11/2013  . GERD 10/04/2009  . Benign prostatic hyperplasia without lower urinary tract symptoms 02/04/1999  . Gout, unspecified 02/04/1999  . Portal hypertension (HCC) 02/04/1999    Patient's Medications  New  Prescriptions   No medications on file  Previous Medications   ERTAPENEM  (INVANZ ) IVPB    Inject 1 g into the vein daily for 19 days. Indication:  RETROPERITONEAL ABSCESS First Dose: Yes Last Day of Therapy:  08/06/23 Labs - Once weekly:  CBC/D and BMP, Labs - Once weekly: ESR and CRP Method of administration: Mini-Bag Plus / Gravity Method of administration may be changed at the discretion of home infusion pharmacist based upon assessment of the patient and/or caregiver's ability to self-administer the medication ordered.   GABAPENTIN  (NEURONTIN ) 300 MG CAPSULE    Take 1 capsule (300 mg total) by mouth 3 (three) times daily.   INSULIN  GLARGINE, 2 UNIT DIAL , (TOUJEO  MAX SOLOSTAR) 300 UNIT/ML SOLOSTAR PEN    Inject 12 Units into the skin daily. Via SANOFI pt assistance   INSULIN  PEN NEEDLE (BD PEN NEEDLE MINI ULTRAFINE) 31G X 5 MM MISC    INJECT 1 ACT INTO THE SKIN DAILY. USE TO ADMINISTER INSULIN . DX E11.9   Strength: 31G X 5 MM   METFORMIN  (GLUCOPHAGE ) 500 MG TABLET    TAKE 1 TABLET BY MOUTH TWICE A DAY WITH FOOD   MULTIPLE VITAMINS-MINERALS (CENTRUM SILVER 50+MEN) TABS    Take 1 tablet by mouth daily.   PANTOPRAZOLE  (PROTONIX ) 40 MG TABLET    TAKE 1 TABLET BY MOUTH 2 TIMES DAILY 30 MINUTES BEFORE MEALS (BREAKFAST AND SUPPER)   TRAMADOL  (ULTRAM ) 50 MG TABLET    Take 1 tablet (50 mg total) by mouth every 12 (twelve) hours as needed for severe pain (pain score 7-10).  Modified Medications   No medications on file  Discontinued Medications   No medications on file    Subjective: 78 year old male with past medical history of CLL, alcoholic, NASH cirrhosis with hepatic  encephalopathy, CVA 2020, diabetes mellitus, history of polymicrobial bacteremia secondary to acute cholecystitis status post cholecystectomy presents for hospital follow-up of retroperitoneal abscess status post drain placement.  Patient admitted with abnormal CT which was done due to weakness and decreased appetite with GI.  MRI  showed 14X hematoma versus abscess.  IR drained 16 cc of purulent fluid, placed drain with cultures growing Klebsiella pneumonia and Citrobacter.  He was discharged on ertapenem  to complete about 3 weeks of antibiotics from drain placement EOT 7/2.  Patient was seen by interventional radiology on 6/27 with CT showing improvement without residual fluid collection.  As patient was still having output about 20 mL/day drain was left in place with follow-up with radiology on 7/2. Review of Systems: ROS  Past Medical History:  Diagnosis Date  . Acrophobia   . Alcoholic cirrhosis (HCC) 10/04/2015  . Anemia   . Arthritis    foot by big toe  . BPH associated with nocturia   . Cataract    removed both eyes  . Cholecystitis 05/2022   tx Abx  . Chronic cough    PMH of  . CLL (chronic lymphocytic leukemia) (HCC)   . COVID-19 12/2018  . Diabetes mellitus without complication (HCC)   . Diverticulosis 07/03/2010   Colonoscopy.   . Duodenal ulcer 2017  . Fallen arches    Bilateral  . GERD (gastroesophageal reflux disease)   . Gout   . Granuloma annulare   . Hx of adenomatous colonic polyps multiple  . Hydrocele 2011   Large septated right hydrocele  . Liver cyst   . Liver lesion   . Nonspecific elevation of levels of transaminase or lactic acid dehydrogenase (LDH)   . Obesity   . Peripheral neuropathy   . Plantar fasciitis    PMH of  . Portal hypertension (HCC) 2017  . Prostate cancer (HCC)   . Right shoulder pain 11/2017  . Sleep apnea    no cpap, patient denies  . Thiamine  deficiency   . Wears reading eyeglasses   . WPW (Wolff-Parkinson-White syndrome) 02/05/2019    Social History   Tobacco Use  . Smoking status: Former    Current packs/day: 0.00    Average packs/day: 2.0 packs/day for 6.0 years (12.0 ttl pk-yrs)    Types: Cigarettes    Start date: 02/03/1965    Quit date: 02/04/1971    Years since quitting: 52.5    Passive exposure: Never  . Smokeless tobacco: Never  .  Tobacco comments:    smoked age [age], up to 2 ppd  Vaping Use  . Vaping status: Never Used  Substance Use Topics  . Alcohol use: Not Currently    Comment: No alochol since Christmas Day 2020  . Drug use: No    Family History  Problem Relation Age of Onset  . Lung cancer Mother        smoker  . Leukemia Father        Acute myelocytic  . Diabetes Neg Hx   . Stroke Neg Hx   . Heart disease Neg Hx   . Colon cancer Neg Hx   . Colon polyps Neg Hx   . Esophageal cancer Neg Hx   . Rectal cancer Neg Hx   . Stomach cancer Neg Hx     Allergies  Allergen Reactions  . Rifaximin  Nausea And Vomiting    Required EMS visit, although patient did not go to hospital  . Beta Adrenergic Blockers     Likely  WPW.  Use with caution  . Calcium  Channel Blockers     Likely WPW.  Use with caution.    Health Maintenance  Topic Date Due  . Zoster Vaccines- Shingrix  (1 of 2) Never done  . Hepatitis B Vaccines (2 of 3 - Risk 3-dose series) 11/30/2015  . COVID-19 Vaccine (3 - Moderna risk series) 01/10/2020  . OPHTHALMOLOGY EXAM  05/17/2022  . Medicare Annual Wellness (AWV)  12/03/2022  . INFLUENZA VACCINE  09/04/2023  . HEMOGLOBIN A1C  01/12/2024  . Diabetic kidney evaluation - Urine ACR  04/27/2024  . FOOT EXAM  04/27/2024  . Diabetic kidney evaluation - eGFR measurement  07/29/2024  . DTaP/Tdap/Td (4 - Td or Tdap) 06/27/2029  . Pneumococcal Vaccine: 50+ Years  Completed  . Hepatitis C Screening  Completed  . HPV VACCINES  Aged Out  . Meningococcal B Vaccine  Aged Out  . Colonoscopy  Discontinued    Objective:  Vitals:   08/03/23 1014  Weight: 208 lb (94.3 kg)  Height: 6' 2 (1.88 m)   Body mass index is 26.71 kg/m.  Physical Exam Lab Results Lab Results  Component Value Date   WBC 7.8 07/30/2023   HGB 10.6 (L) 07/30/2023   HCT 31.1 (L) 07/30/2023   MCV 88.6 07/30/2023   PLT 150 07/30/2023    Lab Results  Component Value Date   CREATININE 0.94 07/30/2023   BUN 19  07/30/2023   NA 140 07/30/2023   K 4.3 07/30/2023   CL 107 07/30/2023   CO2 27 07/30/2023    Lab Results  Component Value Date   ALT 11 07/30/2023   AST 20 07/30/2023   ALKPHOS 117 07/30/2023   BILITOT 0.6 07/30/2023    Lab Results  Component Value Date   CHOL 178 04/28/2023   HDL 69.80 04/28/2023   LDLCALC 90 04/28/2023   TRIG 90.0 04/28/2023   CHOLHDL 3 04/28/2023   Lab Results  Component Value Date   LABRPR NON REAC 07/16/2012   No results found for: HIV1RNAQUANT, HIV1RNAVL, CD4TABS   Problem List Items Addressed This Visit   None  Results   Assessment/Plan #Recurrent abscess status post drain placement on 6/9 - Initial MRI showed 14.6 cm hematoma versus thrombus versus abscess - Underwent IR drain placement of retroperitoneal abscess which drained 60 cc of purulent fluid growing Klebsiella pneumoniae Citrobacter, Bacteroides ovatus beta-lactamase positive.  He was treated meropenem  inpatient then transition to ertapenem  to complete about 3 weeks antibiotics from drain placement as EOT 7/2 - She was seen by interventional radiology on 6/27 which showed CT without residual fluid.  He was still draining about 20 mL of fluid as such drain was left in place with follow-up on 7/2 with repeat imaging.  #Medication management #PICC #Labs - 6/26 LFTs normal, SCR 0.94, WBC 7.8K. - Can stop antibiotics if drain is removed on 7/2. Can take PICC lne out.  -   Loney Stank, MD [facility] 08/03/2023, 10:18 AM

## 2023-08-04 ENCOUNTER — Ambulatory Visit (HOSPITAL_COMMUNITY)
Admission: RE | Admit: 2023-08-04 | Discharge: 2023-08-04 | Disposition: A | Discharge status: Home / Self Care | Source: Ambulatory Visit | Attending: Physician Assistant | Admitting: Physician Assistant

## 2023-08-04 ENCOUNTER — Telehealth: Payer: Self-pay

## 2023-08-04 ENCOUNTER — Inpatient Hospital Stay

## 2023-08-04 ENCOUNTER — Ambulatory Visit: Payer: Self-pay | Admitting: Physician Assistant

## 2023-08-04 ENCOUNTER — Other Ambulatory Visit: Payer: Self-pay | Admitting: Physician Assistant

## 2023-08-04 ENCOUNTER — Inpatient Hospital Stay: Attending: Physician Assistant | Admitting: Physician Assistant

## 2023-08-04 VITALS — BP 130/62 | HR 90 | Temp 97.3°F | Resp 17 | Wt 208.7 lb

## 2023-08-04 DIAGNOSIS — D6959 Other secondary thrombocytopenia: Secondary | ICD-10-CM | POA: Diagnosis not present

## 2023-08-04 DIAGNOSIS — Z87891 Personal history of nicotine dependence: Secondary | ICD-10-CM | POA: Diagnosis not present

## 2023-08-04 DIAGNOSIS — Z452 Encounter for adjustment and management of vascular access device: Secondary | ICD-10-CM | POA: Diagnosis not present

## 2023-08-04 DIAGNOSIS — C9110 Chronic lymphocytic leukemia of B-cell type not having achieved remission: Secondary | ICD-10-CM

## 2023-08-04 DIAGNOSIS — D649 Anemia, unspecified: Secondary | ICD-10-CM | POA: Diagnosis not present

## 2023-08-04 DIAGNOSIS — D696 Thrombocytopenia, unspecified: Secondary | ICD-10-CM

## 2023-08-04 DIAGNOSIS — R10819 Abdominal tenderness, unspecified site: Secondary | ICD-10-CM

## 2023-08-04 DIAGNOSIS — Z8546 Personal history of malignant neoplasm of prostate: Secondary | ICD-10-CM | POA: Insufficient documentation

## 2023-08-04 LAB — CMP (CANCER CENTER ONLY)
ALT: 10 U/L (ref 0–44)
AST: 18 U/L (ref 15–41)
Albumin: 3.9 g/dL (ref 3.5–5.0)
Alkaline Phosphatase: 108 U/L (ref 38–126)
Anion gap: 6 (ref 5–15)
BUN: 13 mg/dL (ref 8–23)
CO2: 30 mmol/L (ref 22–32)
Calcium: 9.3 mg/dL (ref 8.9–10.3)
Chloride: 104 mmol/L (ref 98–111)
Creatinine: 0.95 mg/dL (ref 0.61–1.24)
GFR, Estimated: >60 mL/min (ref >60)
Glucose, Bld: 120 mg/dL — ABNORMAL HIGH (ref 70–99)
Potassium: 4.6 mmol/L (ref 3.5–5.1)
Sodium: 140 mmol/L (ref 135–145)
Total Bilirubin: 0.9 mg/dL (ref 0.0–1.2)
Total Protein: 6.6 g/dL (ref 6.5–8.1)

## 2023-08-04 LAB — CBC WITH DIFFERENTIAL (CANCER CENTER ONLY)
Abs Immature Granulocytes: 0.01 10*3/uL (ref 0.00–0.07)
Basophils Absolute: 0 10*3/uL (ref 0.0–0.1)
Basophils Relative: 0 %
Eosinophils Absolute: 0.1 10*3/uL (ref 0.0–0.5)
Eosinophils Relative: 2 %
HCT: 32.8 % — ABNORMAL LOW (ref 39.0–52.0)
Hemoglobin: 11 g/dL — ABNORMAL LOW (ref 13.0–17.0)
Immature Granulocytes: 0 %
Lymphocytes Relative: 49 %
Lymphs Abs: 2.8 10*3/uL (ref 0.7–4.0)
MCH: 29.9 pg (ref 26.0–34.0)
MCHC: 33.5 g/dL (ref 30.0–36.0)
MCV: 89.1 fL (ref 80.0–100.0)
Monocytes Absolute: 0.4 10*3/uL (ref 0.1–1.0)
Monocytes Relative: 6 %
Neutro Abs: 2.5 10*3/uL (ref 1.7–7.7)
Neutrophils Relative %: 43 %
Platelet Count: 106 10*3/uL — ABNORMAL LOW (ref 150–400)
RBC: 3.68 MIL/uL — ABNORMAL LOW (ref 4.22–5.81)
RDW: 15.7 % — ABNORMAL HIGH (ref 11.5–15.5)
WBC Count: 5.9 10*3/uL (ref 4.0–10.5)
nRBC: 0 % (ref 0.0–0.2)

## 2023-08-04 LAB — FERRITIN: Ferritin: 368 ng/mL — ABNORMAL HIGH (ref 24–336)

## 2023-08-04 LAB — VITAMIN B12: Vitamin B-12: 358 pg/mL (ref 180–914)

## 2023-08-04 LAB — IRON AND IRON BINDING CAPACITY (CC-WL,HP ONLY)
Iron: 82 ug/dL (ref 45–182)
Saturation Ratios: 26 % (ref 17.9–39.5)
TIBC: 321 ug/dL (ref 250–450)
UIBC: 239 ug/dL (ref 117–376)

## 2023-08-04 LAB — LACTATE DEHYDROGENASE: LDH: 164 U/L (ref 98–192)

## 2023-08-04 LAB — FOLATE: Folate: 6.7 ng/mL (ref >5.9)

## 2023-08-04 NOTE — Progress Notes (Unsigned)
 [facility] [phone]   [phone]) [phone]  PROGRESS NOTE  Patient Care Team: Joshua Debby CROME, MD as PCP - General (Internal Medicine) Court Dorn PARAS, MD as PCP - Cardiology (Cardiology) Skeet Juliene SAUNDERS, DO as Consulting Physician (Neurology) Szabat, Toribio BROCKS, [facility] (Inactive) (Pharmacist) Carolee Heron NOVAK, RN as Case Manager Abelino Beatris HERO, RN as [facility] Care Management  Hematological/Oncological History # Chronic Lymphocytic Leukemia (CLL) Rai Stage 0 # Thrombocytopenia 2/2 to Cirrhosis 10/04/2020: establish care with Dr. Federico. WBC 9.7, ALC 5100. 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 male with medical history significant for CLL who presents for a follow up visit. The patient's last visit was on 11/19/2022. In the interim since the last visit he was admitted from 07/09/2023 to 07/20/2023 due to sepsis secondary to retroperitoneal hematoma infection.  Patrick Kidd is slowly recovering from his hospitalization by increasing his energy and strength.  He is currently undergoing home physical therapy once a week.  He did have a recent fall after tripping over something.  He admits the primary cause of his falls have been due to instability.  He reports that his last fall that resulted in him having some left hip/flank pain.  He continues to have the percutaneous drain in place with less than 10 cc of fluid per day.  He continues on IV antibiotics via PICC line.  He plans to follow-up with IR tomorrow to determine if his drain can be removed.  He reports his appetite is good and he denies any nausea, vomiting or bowel habit changes.  He denies easy bruising or signs of active bleeding.  He denies fevers, chills, night sweats, shortness of breath, chest pain or cough.  He has no other complaints.  Full 10 point ROS is listed below.  MEDICAL HISTORY:  Past  Medical History:  Diagnosis Date   Acrophobia    Alcoholic cirrhosis (HCC) 10/04/2015   Anemia    Arthritis    foot by big toe   BPH associated with nocturia    Cataract    removed both eyes   Cholecystitis 05/2022   tx Abx   Chronic cough    PMH of   CLL (chronic lymphocytic leukemia) (HCC)    COVID-19 12/2018   Diabetes mellitus without complication (HCC)    Diverticulosis 07/03/2010   Colonoscopy.    Duodenal ulcer 2017   Fallen arches    Bilateral   GERD (gastroesophageal reflux disease)    Gout    Granuloma annulare    Hx of adenomatous colonic polyps multiple   Hydrocele 2011   Large septated right hydrocele   Liver cyst    Liver lesion    Nonspecific elevation of levels of transaminase or lactic acid dehydrogenase (LDH)    Obesity    Peripheral neuropathy    Plantar fasciitis    PMH of   Portal hypertension (HCC) 2017   Prostate cancer (HCC)    Right shoulder pain 11/2017   Sleep apnea    no cpap, patient denies   Thiamine  deficiency    Wears reading eyeglasses    WPW (Wolff-Parkinson-White syndrome) 02/05/2019    SURGICAL HISTORY: Past Surgical History:  Procedure Laterality Date   BACK SURGERY  04/28/2022   CATARACT EXTRACTION, BILATERAL  12/2011   Dr Roz SHACKLE, ROBOT-ASSISTED, LAPAROSCOPIC  03/2023   [facility]   COLONOSCOPY  2017  COLONOSCOPY W/ POLYPECTOMY  07/03/2010   2 adenomas, diverticulosis on right. Dr Avram   CYSTOSCOPY N/A 03/15/2018   Procedure: PHYLLIS SIDE;  Surgeon: Carolee Sherwood JONETTA DOUGLAS, MD;  Location: [facility];  Service: Urology;  Laterality: N/A;  NO SEEDS FOUND IN BLADDER   ESOPHAGOGASTRODUODENOSCOPY (EGD) WITH PROPOFOL  N/A 01/08/2016   Procedure: ESOPHAGOGASTRODUODENOSCOPY (EGD) WITH PROPOFOL ;  Surgeon: Gwendlyn ONEIDA Buddy, MD;  Location: [facility];  Service: Endoscopy;  Laterality: N/A;   FLEXIBLE SIGMOIDOSCOPY  2000   IR EXCHANGE BILIARY DRAIN  02/17/2023   IR PATIENT EVAL TECH 0-60 MINS   12/26/2022   IR PERC CHOLECYSTOSTOMY  12/05/2022   IR PERC CHOLECYSTOSTOMY  12/09/2022   IR RADIOLOGIST EVAL & MGMT  01/16/2023   IR RADIOLOGIST EVAL & MGMT  07/31/2023   RADIOACTIVE SEED IMPLANT N/A 03/15/2018   Procedure: RADIOACTIVE SEED IMPLANT/BRACHYTHERAPY IMPLANT;  Surgeon: Carolee Sherwood JONETTA DOUGLAS, MD;  Location: [facility];  Service: Urology;  Laterality: N/A;   24 SEEDS IMPLANTED   RADIOLOGY WITH ANESTHESIA N/A 12/09/2022   Procedure: IR WITH ANESTHESIA;  Surgeon: Radiologist, Medication, MD;  Location: [facility];  Service: Radiology;  Laterality: N/A;   SIGMOIDOSCOPY     SPACE OAR INSTILLATION N/A 03/15/2018   Procedure: SPACE OAR INSTILLATION;  Surgeon: Carolee Sherwood JONETTA DOUGLAS, MD;  Location: [facility];  Service: Urology;  Laterality: N/A;   WISDOM TOOTH EXTRACTION      SOCIAL HISTORY: Social History   Socioeconomic History   Marital status: Married    Spouse name: [name]   Number of children: 2   Years of education: Not on file   Highest education level: Some college, no degree  Occupational History   Occupation: Multimedia programmer estate  Tobacco Use   Smoking status: Former    Current packs/day: 0.00    Average packs/day: 2.0 packs/day for 6.0 years (12.0 ttl pk-yrs)    Types: Cigarettes    Start date: 02/03/1965    Quit date: 02/04/1971    Years since quitting: 52.5    Passive exposure: Never   Smokeless tobacco: Never   Tobacco comments:    smoked age [age], up to 2 ppd  Vaping Use   Vaping status: Never Used  Substance and Sexual Activity   Alcohol use: Not Currently    Comment: No alochol since Christmas Day 2020   Drug use: No   Sexual activity: Yes    Partners: Female  Other Topics Concern   Not on file  Social History Narrative   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 He  lives with his wife in a one level home. He swims most days.   Social Drivers of Corporate investment banker Strain: Low Risk  (03/21/2023)   Received from [facility]   Overall Financial Resource Strain (CARDIA)    Difficulty of Paying Living Expenses: Not very hard  Food Insecurity: No Food Insecurity (07/22/2023)   Hunger Vital Sign    Worried About Running Out of Food in the Last Year: Never true    Ran Out of Food in the Last Year: Never true  Transportation Needs: No Transportation Needs (07/22/2023)   PRAPARE - Administrator, Civil Service (Medical): No    Lack of Transportation (Non-Medical): No  Physical Activity: Inactive (12/02/2021)   Exercise Vital Sign    Days of Exercise per Week: 0 days    Minutes of Exercise per Session: 0 min  Stress: No Stress Concern Present (12/02/2021)   [employer] of Occupational Health - Occupational Stress Questionnaire    Feeling of Stress : Not at all  Social Connections: Moderately Integrated (12/02/2021)   Social Connection and Isolation Panel    Frequency of Communication with Friends and Family: More than three times a week    Frequency of Social Gatherings with Friends and Family: More than three times a week    Attends Religious Services: Never    Database administrator or Organizations: Yes    Attends Engineer, structural: More than 4 times per year    Marital Status: Married  Catering manager Violence: Not At Risk (07/22/2023)   Humiliation, Afraid, Rape, and Kick questionnaire    Fear of Current or Ex-Partner: No    Emotionally Abused: No    Physically Abused: No    Sexually Abused: No    FAMILY HISTORY: Family History  Problem Relation Age of Onset   Lung cancer Mother        smoker   Leukemia Father        Acute myelocytic   Diabetes Neg Hx    Stroke Neg Hx    Heart disease Neg Hx    Colon cancer Neg Hx    Colon polyps Neg Hx    Esophageal cancer Neg Hx    Rectal cancer  Neg Hx    Stomach cancer Neg Hx     ALLERGIES:  is allergic to rifaximin , beta adrenergic blockers, and calcium  channel blockers.  MEDICATIONS:  Current Outpatient Medications  Medication Sig Dispense Refill   ertapenem  (INVANZ ) IVPB Inject 1 g into the vein daily for 19 days. Indication:  RETROPERITONEAL ABSCESS First Dose: Yes Last Day of Therapy:  08/06/23 Labs - Once weekly:  CBC/D and BMP, Labs - Once weekly: ESR and CRP Method of administration: Mini-Bag Plus / Gravity Method of administration may be changed at the discretion of home infusion pharmacist based upon assessment of the patient and/or caregiver's ability to self-administer the medication ordered. 19 Units 0   gabapentin  (NEURONTIN ) 300 MG capsule Take 1 capsule (300 mg total) by mouth 3 (three) times daily. 270 capsule 0   insulin  glargine, 2 Unit Dial , (TOUJEO  MAX SOLOSTAR) 300 UNIT/ML Solostar Pen Inject 12 Units into the skin daily. Via SANOFI pt assistance 12 mL 0   Insulin  Pen Needle (BD PEN NEEDLE MINI ULTRAFINE) 31G X 5 MM MISC INJECT 1 ACT INTO THE SKIN DAILY. USE TO ADMINISTER INSULIN . DX E11.9   Strength: 31G X 5 MM 100 each 1   metFORMIN  (GLUCOPHAGE ) 500 MG tablet TAKE 1 TABLET BY MOUTH TWICE A DAY WITH FOOD 180 tablet 1   Multiple Vitamins-Minerals (CENTRUM SILVER 50+MEN) TABS Take 1 tablet by mouth daily.     pantoprazole  (PROTONIX ) 40 MG tablet TAKE 1 TABLET BY MOUTH 2 TIMES DAILY 30 MINUTES BEFORE MEALS (BREAKFAST AND SUPPER) 180 tablet 0   traMADol  (ULTRAM ) 50 MG tablet Take 1 tablet (50 mg total) by mouth every 12 (twelve) hours as needed for severe pain (pain score 7-10). (Patient not taking: Reported on 08/04/2023) 10 tablet 0   No current facility-administered medications for this visit.    REVIEW OF SYSTEMS:   Constitutional: ( - ) fevers, ( - )  chills , ( - )  night sweats Eyes: ( - ) blurriness of vision, ( - ) double vision, ( - ) watery eyes Ears, nose, mouth, throat, and face: ( - ) mucositis, (  - ) sore throat Respiratory: ( - ) cough, ( - ) dyspnea, ( - ) wheezes Cardiovascular: ( - ) palpitation, ( - ) chest discomfort, ( - ) lower extremity swelling Gastrointestinal:  ( - ) nausea, ( - ) heartburn, ( - ) change in bowel habits Skin: ( - ) abnormal skin rashes Lymphatics: ( - ) new lymphadenopathy, ( - ) easy bruising Neurological: ( - ) numbness, ( - ) tingling, ( - ) new weaknesses Behavioral/Psych: ( - ) mood change, ( - ) new changes  All other systems were reviewed with the patient and are negative.  PHYSICAL EXAMINATION: ECOG PERFORMANCE STATUS: 1 - Symptomatic but completely ambulatory  Vitals:   08/04/23 1004  BP: 130/62  Pulse: 90  Resp: 17  Temp: (!) 97.3 F (36.3 C)  SpO2: 100%    Filed Weights   08/04/23 1004  Weight: 208 lb 11.2 oz (94.7 kg)     GENERAL: Well-appearing elderly Caucasian male, alert, no distress and comfortable SKIN: skin color, texture, turgor are normal, no rashes or significant lesions EYES: conjunctiva are pink and non-injected, sclera clear NECK: supple, non-tender LUNGS: clear to auscultation and percussion with normal breathing effort HEART: regular rate & rhythm and no murmurs and no lower extremity edema Musculoskeletal: no cyanosis of digits and no clubbing  PSYCH: alert & oriented x 3, fluent speech NEURO: no focal motor/sensory deficits  LABORATORY DATA:  I have reviewed the data as listed    Latest Ref Rng & Units 07/30/2023    3:27 PM 07/18/2023    6:11 AM 07/16/2023    6:18 AM  CBC  WBC 4.0 - 10.5 K/uL 7.8  8.0  14.7   Hemoglobin 13.0 - 17.0 g/dL 89.3  89.7  9.5   Hematocrit 39.0 - 52.0 % 31.1  31.7  30.0   Platelets 150 - 400 K/uL 150  293  253        Latest Ref Rng & Units 07/30/2023    3:27 PM 07/18/2023    6:11 AM 07/16/2023    6:18 AM  CMP  Glucose 70 - 99 mg/dL 833  868  877   BUN 8 - 23 mg/dL 19  14  16    Creatinine 0.61 - 1.24 mg/dL 9.05  9.17  9.14   Sodium 135 - 145 mmol/L 140  135  134    Potassium 3.5 - 5.1 mmol/L 4.3  3.7  3.4   Chloride 98 - 111 mmol/L 107  98  99   CO2 22 - 32 mmol/L 27  28  24    Calcium  8.9 - 10.3 mg/dL 8.7  8.5  8.6   Total Protein 6.5 - 8.1 g/dL 6.1  6.1    Total Bilirubin 0.0 - 1.2 mg/dL 0.6  0.9    Alkaline Phos 38 - 126 U/L 117  121    AST 15 - 41 U/L 20  23    ALT 0 - 44 U/L 11  16     RADIOGRAPHIC STUDIES: I have personally reviewed the radiological images as listed and agreed with the findings in the report. CT ABDOMEN PELVIS W CONTRAST Result Date: 07/31/2023 CLINICAL DATA:  Follow-up retroperitoneal abscess status post percutaneous catheter drainage. Prostate carcinoma. * Tracking Code: BO * EXAM: CT ABDOMEN AND PELVIS WITH CONTRAST TECHNIQUE:  Multidetector CT imaging of the abdomen and pelvis was performed using the standard protocol following bolus administration of intravenous contrast. RADIATION DOSE REDUCTION: This exam was performed according to the departmental dose-optimization program which includes automated exposure control, adjustment of the mA and/or kV according to patient size and/or use of iterative reconstruction technique. CONTRAST:  100mL ISOVUE -300 IOPAMIDOL  (ISOVUE -300) INJECTION 61% COMPARISON:  07/15/2023 FINDINGS: Lower Chest: No acute findings. Hepatobiliary: Hepatic cirrhosis again noted. Moderate benign-appearing cyst noted in the right hepatic lobe. No suspicious liver masses identified. No No evidence of portal or hepatic vein thrombosis. Recanalization paraumbilical veins is seen, consistent with portal venous hypertension. No evidence of ascites. Prior cholecystectomy. No evidence of biliary obstruction. Pancreas:  No mass or inflammatory changes. Spleen: Moderate splenomegaly measuring approximately 16 cm in length, consistent with portal venous hypertension. Adrenals/Urinary Tract: No suspicious masses identified. No evidence of ureteral calculi or hydronephrosis. Unremarkable unopacified urinary bladder. Stomach/Bowel:  No evidence of bowel obstruction. Normal appendix visualized. Colonic diverticulosis is seen with greatest involvement in the transverse colon. No evidence of diverticulitis. Vascular/Lymphatic: No pathologically enlarged lymph nodes. No acute vascular findings. Portosystemic venous collaterals seen in the gastrohepatic and gastrosplenic ligaments, consistent with portal venous hypertension. Reproductive: Normal size prostate gland with brachytherapy seeds. Symmetric seminal vesicles. Other: A percutaneous drainage catheter is now seen in the right pelvis adjacent to the iliacus muscle, and previously seen large retroperitoneal fluid collection at this site has nearly completely resolved. Musculoskeletal:  No suspicious bone lesions identified. IMPRESSION: Near complete resolution of right pelvic retroperitoneal fluid collection following percutaneous drainage catheter placement. Hepatic cirrhosis and findings of portal venous hypertension. No evidence of hepatic neoplasm or ascites. Colonic diverticulosis, without radiographic evidence of diverticulitis. Prostate brachytherapy seeds.  No evidence of metastatic disease. Electronically Signed   By: Norleen DELENA Kil M.D.   On: 07/31/2023 16:53   IR Radiologist Eval & Mgmt Result Date: 07/31/2023 EXAM: ESTABLISHED PATIENT OFFICE VISIT CHIEF COMPLAINT: See Epic note. HISTORY OF PRESENT ILLNESS: See Epic note. REVIEW OF SYSTEMS: See Epic note. PHYSICAL EXAMINATION: See Epic note. ASSESSMENT AND PLAN: See Epic note. Ester Sides, MD [facility] Radiology Electronically Signed   By: Ester Sides M.D.   On: 07/31/2023 12:23   US  EKG SITE RITE Result Date: 07/17/2023 If Site Rite image not attached, placement could not be confirmed due to current cardiac rhythm.  CT GUIDED PERITONEAL/RETROPERITONEAL FLUID DRAIN BY PERC CATH Result Date: 07/16/2023 INDICATION: [id number] Retroperitoneal abscess [facility]) [id number] Briefly,  78 year old male with RIGHT flank retroperitoneal abscess s/p recent drain placement 07/13/2023. Patient is reportedly encephalopathic and pulled out his catheter. Request for replacement EXAM: CT-GUIDED RIGHT FLANK/RETROPERITONEAL ABSCESS DRAINAGE CATHETER PLACEMENT COMPARISON:  IR CT, 07/11/2023.  CT AP, 07/15/2023. MEDICATIONS: The patient is currently admitted to the hospital and receiving intravenous antibiotics. The antibiotics were administered within an appropriate time frame prior to the initiation of the procedure. ANESTHESIA/SEDATION: Local anesthetic and single agent sedation was employed during this procedure. A total of fentanyl  100 mcg was administered intravenously. The patient's level of consciousness and vital signs were monitored continuously by radiology nursing throughout the procedure under my direct supervision. CONTRAST:  None FLUOROSCOPY TIME:  CT dose; 985 mGycm COMPLICATIONS: None immediate. PROCEDURE: RADIATION DOSE REDUCTION: This exam was performed according to the departmental dose-optimization program which includes automated exposure control, adjustment of the mA and/or kV according to patient size and/or use of iterative reconstruction technique. Informed written consent was obtained from the patient after a  discussion of the risks, benefits and alternatives to treatment. The patient was placed supine on the CT gantry and a pre procedural CT was performed re-demonstrating the known abscess/fluid collection within the RIGHT retroperitoneum. The procedure was planned. A timeout was performed prior to the initiation of the procedure. The RIGHT flank was prepped and draped in the usual sterile fashion. The overlying soft tissues were anesthetized with 1% lidocaine  with epinephrine . Appropriate trajectory was planned with the use of a 22 gauge spinal needle. An 18 gauge trocar needle was advanced into the abscess/fluid collection and a short Amplatz super stiff wire was coiled within the  collection. Appropriate positioning was confirmed with a limited CT scan. The tract was serially dilated allowing placement of a 14 Fr drainage catheter. Appropriate positioning was confirmed with a limited postprocedural CT scan. 10 mL of purulent fluid was aspirated. The tube was connected to a bulb suction and sutured in place. A dressing was placed. The patient tolerated the procedure well without immediate post procedural complication. IMPRESSION: Successful CT-guided placement of a 14 Fr drainage catheter into the RIGHT retroperitoneal abscess. RECOMMENDATIONS: The patient will return to Vascular Interventional Radiology (VIR) for routine drainage catheter evaluation and exchange in 10-14 days. Thom Hall, MD [facility] Radiology Electronically Signed   By: Thom Hall M.D.   On: 07/16/2023 15:49   CT ABDOMEN PELVIS W CONTRAST Result Date: 07/16/2023 CLINICAL DATA:  Worsening flank pain post retroperitoneal abscess drainage EXAM: CT ABDOMEN AND PELVIS WITH CONTRAST TECHNIQUE: Multidetector CT imaging of the abdomen and pelvis was performed using the standard protocol following bolus administration of intravenous contrast. RADIATION DOSE REDUCTION: This exam was performed according to the departmental dose-optimization program which includes automated exposure control, adjustment of the mA and/or kV according to patient size and/or use of iterative reconstruction technique. CONTRAST:  OMNIPAQUE  IOHEXOL  300 MG/ML  SOLN COMPARISON:  07/13/2023 and previous FINDINGS: Lower chest: Trace right pleural effusion stable since 07/11/2023. Small volume pericardial fluid. Coronary and aortic calcifications. Some linear scarring/atelectasis in the lung bases right greater than left as before. Hepatobiliary: 6 cm right hepatic cyst stable. No new liver lesion or biliary ductal dilatation. Gallbladder is decompressed or absent. Pancreas: Unremarkable. No pancreatic  ductal dilatation or surrounding inflammatory changes. Spleen: Splenomegaly, 16.2 cm craniocaudal length, without focal lesion. Adrenals/Urinary Tract: No adrenal mass. 2 mm peripheral calculus mid right renal collecting system. Symmetric renal contours and enhancement without hydronephrosis. Urinary bladder incompletely distended. Stomach/Bowel: Stomach is nondistended. Small bowel decompressed. Normal appendix. The colon is partially distended, with a few scattered diverticula predominately right-sided; no adjacent inflammatory change. Vascular/Lymphatic: Mild scattered aortoiliac calcified plaque without AAA. Portal vein patent. A few subcentimeter left para-aortic and aortocaval lymph nodes. No pelvic or mesenteric adenopathy. Reproductive: Multiple metallic seeds about the prostate. Other: The previously placed pigtail drain catheter has significant retracted, the pigtail projecting in the lateral body wall musculature. Persistent right retroperitoneal/pelvic loculated fluid collection 14.6 cm maximum diameter, only minimally improved from previous. No ascites. No free air. Trace presacral fluid/edema stable. Musculoskeletal: Vertebral endplate spurring at multiple levels in the lower thoracic spine. Mild multilevel spondylitic changes in the lumbar spine. Bilateral hip DJD. IMPRESSION: 1. Significant retraction of right retroperitoneal/pelvic drain catheter, the pigtail projecting in the lateral body wall musculature. Little improvement in right retroperitoneal/pelvic abscess. Findings reviewed with Dr. Hall. 2. Splenomegaly. 3. Coronary and Aortic Atherosclerosis (ICD10-I70.0). Electronically Signed   By: JONETTA Faes M.D.   On: 07/16/2023 08:48  CT GUIDED PERITONEAL/RETROPERITONEAL FLUID DRAIN BY PERC CATH Result Date: 07/13/2023 INDICATION: [id number] Abscess [id number] EXAM: CT-GUIDED RIGHT FLANK/RETROPERITONEAL ABSCESS DRAINAGE CATHETER PLACEMENT COMPARISON:  CT AP and MRI abdomen pelvis, 07/11/2023  MEDICATIONS: The patient is currently admitted to the hospital and receiving intravenous antibiotics. The antibiotics were administered within an appropriate time frame prior to the initiation of the procedure. ANESTHESIA/SEDATION: Moderate (conscious) sedation was employed during this procedure. A total of Versed  2 mg and Fentanyl  100 mcg was administered intravenously. Moderate Sedation Time: 23 minutes. The patient's level of consciousness and vital signs were monitored continuously by radiology nursing throughout the procedure under my direct supervision. CONTRAST:  None FLUOROSCOPY: CT dose; 1274 mGycm COMPLICATIONS: None immediate. PROCEDURE: RADIATION DOSE REDUCTION: This exam was performed according to the departmental dose-optimization program which includes automated exposure control, adjustment of the mA and/or kV according to patient size and/or use of iterative reconstruction technique. Informed written consent was obtained from the patient after a discussion of the risks, benefits and alternatives to treatment. The patient was placed supine on the CT gantry and a pre procedural CT was performed re-demonstrating the known abscess/fluid collection within the the RIGHT flank. The procedure was planned. A timeout was performed prior to the initiation of the procedure. The RIGHT 5 was prepped and draped in the usual sterile fashion. The overlying soft tissues were anesthetized with 1% lidocaine  with epinephrine . Appropriate trajectory was planned with the use of a 22 gauge spinal needle. An 18 gauge trocar needle was advanced into the abscess/fluid collection and a short Amplatz super stiff wire was coiled within the collection. Appropriate positioning was confirmed with a limited CT scan. The tract was serially dilated allowing placement of a 10 Fr drainage catheter. Appropriate positioning was confirmed with a limited postprocedural CT scan. 60 mL of purulent fluid was aspirated. The tube was connected to  a bulb suction and sutured in place. A dressing was placed. The patient tolerated the procedure well without immediate post procedural complication. IMPRESSION: Successful CT-guided placement of a 10 Fr drainage catheter into the RIGHT retroperitoneal abscess with aspiration of 60 mL of purulent fluid. Samples were sent to the laboratory as requested by the ordering clinical team. RECOMMENDATIONS: The patient will return to Vascular Interventional Radiology (VIR) for routine drainage catheter evaluation and exchange in 10-14 days. Thom Hall, MD [facility] [facility] Radiology Electronically Signed   By: Thom Hall M.D.   On: 07/13/2023 17:28   MR ABDOMEN W WO CONTRAST Result Date: 07/11/2023 CLINICAL DATA:  Evaluate retroperitoneal mass/fluid collection EXAM: MRI ABDOMEN AND PELVIS WITHOUT AND WITH CONTRAST TECHNIQUE: Multiplanar multisequence MR imaging of the abdomen and pelvis was performed both before and after the administration of intravenous contrast. CONTRAST:  9.9mL GADAVIST  GADOBUTROL  1 MMOL/ML IV SOLN COMPARISON:  CT abdomen pelvis, 07/11/2023, 10:07 a.m. FINDINGS: COMBINED FINDINGS FOR BOTH MR ABDOMEN AND PELVIS Examination is generally limited by breath motion artifact throughout, which particularly degrades multiphasic contrast enhanced sequences, as well as coverage and field inhomogeneity issues as detailed below. Lower chest: No acute abnormality. Hepatobiliary: No solid liver abnormality is seen. Coarse, nodular cirrhotic morphology of the liver. Status post cholecystectomy. No biliary ductal dilatation. Benign cyst in the peripheral right lobe of the liver, requiring no further follow-up or characterization. Pancreas: Unremarkable. No pancreatic ductal dilatation or surrounding inflammatory changes. Spleen: Splenomegaly, maximum coronal span 17.8 cm. Adrenals/Urinary Tract: Adrenal glands are unremarkable. Kidneys are normal, without renal calculi,  solid lesion, or hydronephrosis. Bladder is  unremarkable. Stomach/Bowel: Stomach is within normal limits. Appendix appears normal. No evidence of bowel wall thickening, distention, or inflammatory changes. Vascular/Lymphatic: Small varices throughout the left upper quadrant (series 33, image 46). No enlarged abdominal or pelvic lymph nodes. Reproductive: No mass or other significant abnormality. Other: No abdominal wall hernia or abnormality. Trace perihepatic ascites. Large, rim enhancing fluid collection situated in the low right retroperitoneum overlying the inner table of the abdominal wall musculature as well as the right iliacus musculature. Unfortunately, this is incompletely imaged on several sequences due to scanner bore and field inhomogeneity artifact which limits the lower extent of the exam, however within this limitation, collection measures 14.6 x 10.8 by 8.0 cm (series 16, image 23, series 46, image 89, series 43, image 38). Musculoskeletal: No acute or significant osseous findings. IMPRESSION: 1. Examination is generally limited by breath motion artifact throughout, which particularly degrades multiphasic contrast enhanced sequences, as well as coverage and field inhomogeneity issues. 2. Large, rim enhancing fluid collection situated in the low right retroperitoneum overlying the inner table of the abdominal wall musculature as well as the right iliacus. Unfortunately, this is incompletely imaged on several sequences due to scanner bore and field inhomogeneity artifact at the lower extent of the exam, however within this limitation, collection measures 14.6 x 10.8 by 8.0 cm. This appears to have slightly hemorrhagic or proteinaceous character and may reflect a large hematoma, seroma, or abscess. The presence or absence of infection is not established by imaging. 3. Cirrhosis and splenomegaly. Trace perihepatic ascites. 4. Status post cholecystectomy. Electronically Signed   By: Marolyn JONETTA Jaksch M.D.    On: 07/11/2023 16:04   MR PELVIS W WO CONTRAST Result Date: 07/11/2023 CLINICAL DATA:  Evaluate retroperitoneal mass/fluid collection EXAM: MRI ABDOMEN AND PELVIS WITHOUT AND WITH CONTRAST TECHNIQUE: Multiplanar multisequence MR imaging of the abdomen and pelvis was performed both before and after the administration of intravenous contrast. CONTRAST:  9.9mL GADAVIST  GADOBUTROL  1 MMOL/ML IV SOLN COMPARISON:  CT abdomen pelvis, 07/11/2023, 10:07 a.m. FINDINGS: COMBINED FINDINGS FOR BOTH MR ABDOMEN AND PELVIS Examination is generally limited by breath motion artifact throughout, which particularly degrades multiphasic contrast enhanced sequences, as well as coverage and field inhomogeneity issues as detailed below. Lower chest: No acute abnormality. Hepatobiliary: No solid liver abnormality is seen. Coarse, nodular cirrhotic morphology of the liver. Status post cholecystectomy. No biliary ductal dilatation. Benign cyst in the peripheral right lobe of the liver, requiring no further follow-up or characterization. Pancreas: Unremarkable. No pancreatic ductal dilatation or surrounding inflammatory changes. Spleen: Splenomegaly, maximum coronal span 17.8 cm. Adrenals/Urinary Tract: Adrenal glands are unremarkable. Kidneys are normal, without renal calculi, solid lesion, or hydronephrosis. Bladder is unremarkable. Stomach/Bowel: Stomach is within normal limits. Appendix appears normal. No evidence of bowel wall thickening, distention, or inflammatory changes. Vascular/Lymphatic: Small varices throughout the left upper quadrant (series 33, image 46). No enlarged abdominal or pelvic lymph nodes. Reproductive: No mass or other significant abnormality. Other: No abdominal wall hernia or abnormality. Trace perihepatic ascites. Large, rim enhancing fluid collection situated in the low right retroperitoneum overlying the inner table of the abdominal wall musculature as well as the right iliacus musculature. Unfortunately, this  is incompletely imaged on several sequences due to scanner bore and field inhomogeneity artifact which limits the lower extent of the exam, however within this limitation, collection measures 14.6 x 10.8 by 8.0 cm (series 16, image 23, series 46, image 89, series 43, image 38). Musculoskeletal: No acute or significant osseous findings. IMPRESSION: 1. Examination  is generally limited by breath motion artifact throughout, which particularly degrades multiphasic contrast enhanced sequences, as well as coverage and field inhomogeneity issues. 2. Large, rim enhancing fluid collection situated in the low right retroperitoneum overlying the inner table of the abdominal wall musculature as well as the right iliacus. Unfortunately, this is incompletely imaged on several sequences due to scanner bore and field inhomogeneity artifact at the lower extent of the exam, however within this limitation, collection measures 14.6 x 10.8 by 8.0 cm. This appears to have slightly hemorrhagic or proteinaceous character and may reflect a large hematoma, seroma, or abscess. The presence or absence of infection is not established by imaging. 3. Cirrhosis and splenomegaly. Trace perihepatic ascites. 4. Status post cholecystectomy. Electronically Signed   By: Marolyn JONETTA Jaksch M.D.   On: 07/11/2023 16:04   CT ABDOMEN PELVIS WO CONTRAST Result Date: 07/11/2023 CLINICAL DATA:  Abdominal pain. EXAM: CT ABDOMEN AND PELVIS WITHOUT CONTRAST TECHNIQUE: Multidetector CT imaging of the abdomen and pelvis was performed following the standard protocol without IV contrast. RADIATION DOSE REDUCTION: This exam was performed according to the departmental dose-optimization program which includes automated exposure control, adjustment of the mA and/or kV according to patient size and/or use of iterative reconstruction technique. COMPARISON:  CT abdomen pelvis dated 07/09/2023. FINDINGS: Evaluation of this exam is limited in the absence of intravenous contrast.  Lower chest: Minimal bibasilar subpleural atelectasis or pleural thickening. No intra-abdominal free air.  Small perihepatic free fluid. Hepatobiliary: Cirrhosis. There is a 6.2 cm right liver cyst. No biliary dilatation. Cholecystectomy. Pancreas: Unremarkable. No pancreatic ductal dilatation or surrounding inflammatory changes. Spleen: Splenomegaly measuring 18 cm in length. Adrenals/Urinary Tract: The adrenal glands unremarkable there is a punctate nonobstructing right renal interpolar calculus. No hydronephrosis. The left kidney is unremarkable. The visualized ureters and urinary bladder appear unremarkable. Stomach/Bowel: There is moderate stool throughout the colon. There is colonic diverticulosis. There is no bowel obstruction or active inflammation. The appendix is normal. Vascular/Lymphatic: Moderate aortoiliac atherosclerotic disease. The IVC is unremarkable no portal venous gas. There is no adenopathy. Reproductive: Prostate brachytherapy seeds. There is multilobulated fluid attenuating structure in the right scrotum measuring 9 x 6 cm, not characterized on this CT. This may represent a hydrocele or a hypodense testicular mass. Ultrasound recommended for better evaluation. Thickened appearance of the posterior scrotal wall may represent displaced right testicle or a scrotal mass. Other: There is a 10 x 14 cm low attenuating mass in the right lateral lower abdomen abutting the iliacus muscle. This has increased in size since the prior CT. This mass is not characterized on this noncontrast CT but may represent a hematoma or a neoplastic process such as sarcoma or metastasis. MRI may provide better evaluation. Musculoskeletal: Osteopenia with degenerative changes of the spine. Old right posterior rib fractures. No acute osseous pathology. IMPRESSION: 1. Cirrhosis and Splenomegaly. 2. Punctate nonobstructing right renal interpolar calculus. No hydronephrosis. 3. Colonic diverticulosis. No bowel obstruction.  Normal appendix. 4. Interval increase in the right lower abdomen retroperitoneal mass/hematoma. MRI may provide better evaluation. 5. Low attenuating right scrotal mass. Ultrasound recommended for better evaluation. 6.  Aortic Atherosclerosis (ICD10-I70.0). Electronically Signed   By: Vanetta Chou M.D.   On: 07/11/2023 10:44   CT L-SPINE NO CHARGE Result Date: 07/09/2023 CLINICAL DATA:  Right-sided abdominal pain EXAM: CT LUMBAR SPINE WITHOUT CONTRAST TECHNIQUE: Multidetector CT imaging of the lumbar spine was performed without intravenous contrast administration. Multiplanar CT image reconstructions were also generated. RADIATION DOSE REDUCTION: This exam was  performed according to the departmental dose-optimization program which includes automated exposure control, adjustment of the mA and/or kV according to patient size and/or use of iterative reconstruction technique. COMPARISON:  CT abdomen and pelvis same day FINDINGS: Segmentation: 5 lumbar type vertebrae. Alignment: Normal. Vertebrae: No acute fracture or focal pathologic process. There is chronic appearing fragmentation of the right L2 transverse process which may be related to old injury. There is a healed left L2 transverse process fracture. Transverse process Paraspinal and other soft tissues: Negative. Disc levels: There is mild disc space narrowing and endplate osteophyte formation throughout the lumbar spine compatible with degenerative change. T12-L1: No central canal or neural foraminal stenosis. L1-L2: There is bilateral facet arthropathy. No central canal or neural foraminal stenosis. L2-L3: There is bilateral facet arthropathy and thickening of the ligamentum flavum. Mild disc bulge present. There is moderate central canal stenosis. L3-L4: There is bilateral facet arthropathy with thickening of the ligamentum flavum and broad-based disc bulge. There is moderate central canal stenosis. There is mild right neural foraminal stenosis. L4-L5:  There is bilateral facet arthropathy with thickening of the ligamentum flavum and broad-based disc bulge. There is moderate central canal stenosis. There is mild bilateral neural foraminal stenosis. L5-S1: There is bilateral facet arthropathy. There is mild right neural foraminal stenosis. No central canal stenosis. Other: Stranding is seen along the intramuscular right pelvic sidewall. Please see CT of the abdomen and pelvis for further description. IMPRESSION: 1. No acute fracture or traumatic subluxation of the lumbar spine. 2. Multilevel degenerative changes of the lumbar spine with moderate central canal stenosis at L2-L3, L3-L4, and L4-L5. 3. Mild neural foraminal stenosis at L3-L4, L4-L5, and L5-S1. 4. Stranding along the intramuscular right pelvic sidewall. Please see CT of the abdomen and pelvis for further description. Electronically Signed   By: Greig Pique M.D.   On: 07/09/2023 20:18   CT ABDOMEN PELVIS W CONTRAST Result Date: 07/09/2023 CLINICAL DATA:  Bowel obstruction suspected. Abdominal mass. Alcoholic cirrhosis. EXAM: CT ABDOMEN AND PELVIS WITH CONTRAST TECHNIQUE: Multidetector CT imaging of the abdomen and pelvis was performed using the standard protocol following bolus administration of intravenous contrast. RADIATION DOSE REDUCTION: This exam was performed according to the departmental dose-optimization program which includes automated exposure control, adjustment of the mA and/or kV according to patient size and/or use of iterative reconstruction technique. CONTRAST:  OMNIPAQUE  IOHEXOL  300 MG/ML  SOLN COMPARISON:  CT scan abdomen and pelvis from 12/03/2022. FINDINGS: Lower chest: The lung bases are clear. No pleural effusion. The heart is normal in size. No pericardial effusion. Hepatobiliary: The liver is normal in size. There is mild liver surface irregularity/nodularity, compatible with cirrhosis. No suspicious mass. Note is made of 6.2 x 6.4 cm cyst in the right hepatic lobe,  which is stable since the prior study. No intrahepatic or extrahepatic bile duct dilation. Gallbladder is surgically absent. Pancreas: Unremarkable. No pancreatic ductal dilatation or surrounding inflammatory changes. Spleen: Spleen is enlarged measuring upto 9.7 x 16.5 cm orthogonally on coronal plane. No focal lesion. Adrenals/Urinary Tract: Adrenal glands are unremarkable. No suspicious renal mass. There is a 2 mm nonobstructing calculus in the right kidney interpolar region. No other nephroureterolithiasis or obstructive uropathy on either side. Urinary bladder is under distended, precluding optimal assessment. However, no large mass or stones identified. No perivesical fat stranding. Stomach/Bowel: No disproportionate dilation of the small or large bowel loops. No evidence of abnormal bowel wall thickening or inflammatory changes. The appendix is unremarkable. There are scattered diverticula throughout  the colon, without imaging signs of diverticulitis. Vascular/Lymphatic: There is an approximately 9.5 x 12.7 cm soft tissue attenuation collection in the right lower abdomen. Inferiorly, the collection is inseparable from the right iliopsoas muscle however, the epicenter of the collection is outside the muscle. There are few slightly hyperattenuating area along the posteroinferior aspect of the collection and small nodular slightly hyperattenuating area along the anterosuperior aspect of the collection. There is no discrete wall. Findings favor probable evolving hematoma. Correlate clinically. No abdominal or pelvic lymphadenopathy, by size criteria. No aneurysmal dilation of the major abdominal arteries. There are moderate peripheral atherosclerotic vascular calcifications of the aorta and its major branches. Reproductive: Normal size prostate. Symmetric seminal vesicles. Multiple radiation seeds noted in the prostate. There is partially visualized small-to-moderate right hydrocele. Other: There is a tiny fat  containing umbilical hernia. The soft tissues and abdominal wall are otherwise unremarkable. Musculoskeletal: No suspicious osseous lesions. There are moderate multilevel degenerative changes in the visualized spine. IMPRESSION: 1. There is an approximately 9.5 x 12.7 cm soft tissue attenuation collection in the right lower abdomen, as described above. Findings favor probable evolving hematoma. 2. No bowel obstruction. 3. Multiple other nonacute observations, as described above. Aortic Atherosclerosis (ICD10-I70.0). Electronically Signed   By: Ree Molt M.D.   On: 07/09/2023 11:31   DG Chest 2 View Result Date: 07/09/2023 CLINICAL DATA:  Short of breath, fatigue, fever, weight loss EXAM: CHEST - 2 VIEW COMPARISON:  12/08/2022 FINDINGS: Frontal and lateral views of the chest demonstrate an unremarkable cardiac silhouette. No acute airspace disease, effusion, or pneumothorax. No acute bony abnormalities. IMPRESSION: 1. No acute intrathoracic process. Electronically Signed   By: Ozell Daring M.D.   On: 07/09/2023 10:38    ASSESSMENT & PLAN Patrick Kidd is a 78 y.o. male with medical history significant for CLL who presents for a follow up visit.  # Chronic Lymphocytic Leukemia (CLL) Rai Stage 0 --Findings are consistent with chronic lymphocytic leukemia.  He is currently Rai stage 0. --No evidence of lymphadenopathy. Evidence of splenomegaly but most likely secondary to hepatic cirrhosis and portal vein hypertension.  --CLL prognostic panel showed del 13, IGVH (inconclusive), and Zap 70 (borderline)  --labs today show WBC 5.9, hemoglobin 11.0, MCV 89.1, platelets 106 --Plan for return to clinic in 3 months for labs and 6 months for labs/follow up  #Anemia: --Suspect secondary to recent hospitalization for underlying hematoma with sepsis --Hgb is 11.0 today, improving --No evidence of iron, b12 or folate deficiencies.  --Continue to monitor.   # Thrombocytopenia --patient noted to have  cirrhotic morphology of the liver on US  from 07/24/2020 --stronger evidence this represents thrombocytopenia 2/2 to liver disease and not from the CLL --continue to monitor   #Left hip/flank pain: --Xrays obtained to ruled out fracture or acute process. All negative.    Orders Placed This Encounter  Procedures   DG Ribs Unilateral Left    Standing Status:   Future    Expected Date:   08/04/2023    Expiration Date:   08/03/2024    Reason for Exam (SYMPTOM  OR DIAGNOSIS REQUIRED):   fall on left side of abdomen with tenderness to palpation    Preferred imaging location?:   [facility]   DG Abd 2 Views    Standing Status:   Future    Expected Date:   08/04/2023    Expiration Date:   08/03/2024    Reason for Exam (SYMPTOM  OR DIAGNOSIS REQUIRED):  fall on left side of abdomen with tenderness to palpation    Preferred imaging location?:   [facility]   Ferritin    Standing Status:   Future    Number of Occurrences:   1    Expected Date:   08/04/2023    Expiration Date:   08/03/2024   Iron and Iron Binding Capacity ([facility] only)    Standing Status:   Future    Number of Occurrences:   1    Expected Date:   08/04/2023    Expiration Date:   08/03/2024   Vitamin B12    Standing Status:   Future    Number of Occurrences:   1    Expected Date:   08/04/2023    Expiration Date:   08/03/2024   Folate, Serum    Standing Status:   Future    Number of Occurrences:   1    Expected Date:   08/04/2023    Expiration Date:   08/03/2024   CBC with Differential (Cancer Center Only)    Standing Status:   Future    Expiration Date:   08/03/2024   CMP (Cancer Center only)    Standing Status:   Future    Expiration Date:   08/03/2024   Lactate dehydrogenase (LDH)    Standing Status:   Future    Expiration Date:   08/03/2024    All questions were answered. The patient knows to call the clinic with any problems, questions or concerns.  A total of more than 30 minutes were spent on this encounter with  face-to-face time and non-face-to-face time, including preparing to see the patient, ordering tests and/or medications, counseling the patient and coordination of care as outlined above.   Johnston Police PA-C Dept of Hematology and Oncology [facility] Phone: [phone]   08/04/2023 10:55 AM

## 2023-08-04 NOTE — Telephone Encounter (Signed)
 Copied from CRM [id number]. Topic: General - Other >> Aug 04, 2023  3:08 PM [place] P wrote: Reason for CRM: Jim-Physical therapist called to report  PT HAD A FALL 06/27 , TRAUMA TO HEAD / LEFT THIGH , PT FEEL LIKE HE DOES NOT NEED ANY MEDICINE ; HAD XRAY DONE 07/1 Jim callback number:  [phone]

## 2023-08-05 ENCOUNTER — Ambulatory Visit
Admission: RE | Admit: 2023-08-05 | Discharge: 2023-08-05 | Disposition: A | Discharge status: Home / Self Care | Source: Ambulatory Visit | Attending: Internal Medicine | Admitting: Internal Medicine

## 2023-08-05 DIAGNOSIS — Z4803 Encounter for change or removal of drains: Secondary | ICD-10-CM | POA: Diagnosis not present

## 2023-08-05 DIAGNOSIS — K6819 Other retroperitoneal abscess: Secondary | ICD-10-CM

## 2023-08-05 HISTORY — PX: IR RADIOLOGIST EVAL & MGMT: IMG5224

## 2023-08-05 NOTE — Telephone Encounter (Signed)
 Patient's spouse called to inform staff drain was removed. Per note called [employer] with pull picc orders. Verbal orders relayed to Amy.  Lorenda CHRISTELLA Code, RMA

## 2023-08-05 NOTE — Progress Notes (Signed)
 Referring Physician(s): Singh,Mayanka  Chief Complaint: The patient is seen in follow up today s/p retroperitoneal abscess drain placement 07/13/23 and upsize 07/16/23   History of present illness:  Patrick Kidd is a 78 year old man with a history of alcoholic/MASH cirrhosis, SBP, portal hypertension and hepatic encephalopathy ,CLL, and prostate cancer known to IR for multiple interventions stemming from cholecystitis which occurred in Nov 2024.  He underwent percutaneous cholecystostomy tube placement 12/09/23 which was ultimately removed intra-operatively at [facility] in February 2025 during cholecystectomy.  He subsequently presented to his GI office for follow-up with complaint of back at which time a CT Abdomen Pelvis showed a large retroperitoneal abscess. He then underwent abscess drain placement 07/13/23 which was exchanged at [facility] 07/16/23.  His last follow up was 07/31/23 where output had trended down from 50 mL to 20 mL per day, CT showed no residual fluid collection.  A plan was made to discontinue flushes and return on 08/06/23 for re-evaluation of output only.  He now presents for follow-up to [facility] for drain management.   Past Medical History:  Diagnosis Date   Acrophobia    Alcoholic cirrhosis (HCC) 10/04/2015   Anemia    Arthritis    foot by big toe   BPH associated with nocturia    Cataract    removed both eyes   Cholecystitis 05/2022   tx Abx   Chronic cough    PMH of   CLL (chronic lymphocytic leukemia) (HCC)    COVID-19 12/2018   Diabetes mellitus without complication (HCC)    Diverticulosis 07/03/2010   Colonoscopy.    Duodenal ulcer 2017   Fallen arches    Bilateral   GERD (gastroesophageal reflux disease)    Gout    Granuloma annulare    Hx of adenomatous colonic polyps multiple   Hydrocele 2011   Large septated right hydrocele   Liver cyst    Liver lesion    Nonspecific elevation of levels of transaminase or lactic acid dehydrogenase (LDH)     Obesity    Peripheral neuropathy    Plantar fasciitis    PMH of   Portal hypertension (HCC) 2017   Prostate cancer (HCC)    Right shoulder pain 11/2017   Sleep apnea    no cpap, patient denies   Thiamine  deficiency    Wears reading eyeglasses    WPW (Wolff-Parkinson-White syndrome) 02/05/2019    Past Surgical History:  Procedure Laterality Date   BACK SURGERY  04/28/2022   CATARACT EXTRACTION, BILATERAL  12/2011   Dr Roz   CHOLECYSTECTOMY, ROBOT-ASSISTED, LAPAROSCOPIC  03/2023   [facility]   COLONOSCOPY  2017   COLONOSCOPY W/ POLYPECTOMY  07/03/2010   2 adenomas, diverticulosis on right. Dr Avram   CYSTOSCOPY N/A 03/15/2018   Procedure: PHYLLIS SIDE;  Surgeon: Carolee Sherwood JONETTA DOUGLAS, MD;  Location: [facility];  Service: Urology;  Laterality: N/A;  NO SEEDS FOUND IN BLADDER   ESOPHAGOGASTRODUODENOSCOPY (EGD) WITH PROPOFOL  N/A 01/08/2016   Procedure: ESOPHAGOGASTRODUODENOSCOPY (EGD) WITH PROPOFOL ;  Surgeon: Gwendlyn ONEIDA Buddy, MD;  Location: [facility];  Service: Endoscopy;  Laterality: N/A;   FLEXIBLE SIGMOIDOSCOPY  2000   IR EXCHANGE BILIARY DRAIN  02/17/2023   IR PATIENT EVAL TECH 0-60 MINS  12/26/2022   IR PERC CHOLECYSTOSTOMY  12/05/2022   IR PERC CHOLECYSTOSTOMY  12/09/2022   IR RADIOLOGIST EVAL & MGMT  01/16/2023   IR RADIOLOGIST EVAL & MGMT  07/31/2023   RADIOACTIVE SEED IMPLANT N/A 03/15/2018  Procedure: RADIOACTIVE SEED IMPLANT/BRACHYTHERAPY IMPLANT;  Surgeon: Carolee Sherwood JONETTA DOUGLAS, MD;  Location: [facility];  Service: Urology;  Laterality: N/A;   33 SEEDS IMPLANTED   RADIOLOGY WITH ANESTHESIA N/A 12/09/2022   Procedure: IR WITH ANESTHESIA;  Surgeon: Radiologist, Medication, MD;  Location: [facility];  Service: Radiology;  Laterality: N/A;   SIGMOIDOSCOPY     SPACE OAR INSTILLATION N/A 03/15/2018   Procedure: SPACE OAR INSTILLATION;  Surgeon: Carolee Sherwood JONETTA DOUGLAS, MD;  Location: [facility];  Service: Urology;  Laterality: N/A;    WISDOM TOOTH EXTRACTION      Allergies: Rifaximin , Beta adrenergic blockers, and Calcium  channel blockers  Medications: Prior to Admission medications   Medication Sig Start Date End Date Taking? Authorizing Provider  ertapenem  (INVANZ ) IVPB Inject 1 g into the vein daily for 19 days. Indication:  RETROPERITONEAL ABSCESS First Dose: Yes Last Day of Therapy:  08/06/23 Labs - Once weekly:  CBC/D and BMP, Labs - Once weekly: ESR and CRP Method of administration: Mini-Bag Plus / Gravity Method of administration may be changed at the discretion of home infusion pharmacist based upon assessment of the patient and/or caregiver's ability to self-administer the medication ordered. 07/18/23 08/06/23  Dennise Kingsley, MD  gabapentin  (NEURONTIN ) 300 MG capsule Take 1 capsule (300 mg total) by mouth 3 (three) times daily. 05/01/23   Joshua Debby CROME, MD  insulin  glargine, 2 Unit Dial , (TOUJEO  MAX SOLOSTAR) 300 UNIT/ML Solostar Pen Inject 12 Units into the skin daily. Via SANOFI pt assistance 05/01/23   Joshua Debby CROME, MD  Insulin  Pen Needle (BD PEN NEEDLE MINI ULTRAFINE) 31G X 5 MM MISC INJECT 1 ACT INTO THE SKIN DAILY. USE TO ADMINISTER INSULIN . DX E11.9   Strength: 31G X 5 MM 06/30/23   Joshua Debby CROME, MD  metFORMIN  (GLUCOPHAGE ) 500 MG tablet TAKE 1 TABLET BY MOUTH TWICE A DAY WITH FOOD 04/23/23   Joshua Debby CROME, MD  Multiple Vitamins-Minerals (CENTRUM SILVER 50+MEN) TABS Take 1 tablet by mouth daily.    [provider]  pantoprazole  (PROTONIX ) 40 MG tablet TAKE 1 TABLET BY MOUTH 2 TIMES DAILY 30 MINUTES BEFORE MEALS (BREAKFAST AND SUPPER) 06/30/23   Joshua Debby CROME, MD  traMADol  (ULTRAM ) 50 MG tablet Take 1 tablet (50 mg total) by mouth every 12 (twelve) hours as needed for severe pain (pain score 7-10). Patient not taking: Reported on 08/04/2023 07/20/23   Sonjia Held, MD     Family History  Problem Relation Age of Onset   Lung cancer Mother        smoker   Leukemia Father        Acute  myelocytic   Diabetes Neg Hx    Stroke Neg Hx    Heart disease Neg Hx    Colon cancer Neg Hx    Colon polyps Neg Hx    Esophageal cancer Neg Hx    Rectal cancer Neg Hx    Stomach cancer Neg Hx     Social History   Socioeconomic History   Marital status: Married    Spouse name: [name]   Number of children: 2   Years of education: Not on file   Highest education level: Some college, no degree  Occupational History   Occupation: Multimedia programmer estate  Tobacco Use   Smoking status: Former    Current packs/day: 0.00    Average packs/day: 2.0 packs/day for 6.0 years (12.0 ttl pk-yrs)    Types: Cigarettes    Start date:  02/03/1965    Quit date: 02/04/1971    Years since quitting: 52.5    Passive exposure: Never   Smokeless tobacco: Never   Tobacco comments:    smoked age [age], up to 2 ppd  Vaping Use   Vaping status: Never Used  Substance and Sexual Activity   Alcohol use: Not Currently    Comment: No alochol since Christmas Day 2020   Drug use: No   Sexual activity: Yes    Partners: Female  Other Topics Concern   Not on file  Social History Narrative   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 He lives with his wife in a one level home. He swims most days.   Social Drivers of Corporate investment banker Strain: Low Risk  (03/21/2023)   Received from [facility]   Overall Financial Resource Strain (CARDIA)    Difficulty of Paying Living Expenses: Not very hard  Food Insecurity: No Food Insecurity (07/22/2023)   Hunger Vital Sign    Worried About Running Out of Food in the Last Year: Never true    Ran Out of Food in the Last Year: Never true  Transportation Needs: No Transportation Needs (07/22/2023)   PRAPARE - Administrator, Civil Service (Medical): No    Lack of Transportation  (Non-Medical): No  Physical Activity: Inactive (12/02/2021)   Exercise Vital Sign    Days of Exercise per Week: 0 days    Minutes of Exercise per Session: 0 min  Stress: No Stress Concern Present (12/02/2021)   [employer] of Occupational Health - Occupational Stress Questionnaire    Feeling of Stress : Not at all  Social Connections: Moderately Integrated (12/02/2021)   Social Connection and Isolation Panel    Frequency of Communication with Friends and Family: More than three times a week    Frequency of Social Gatherings with Friends and Family: More than three times a week    Attends Religious Services: Never    Database administrator or Organizations: Yes    Attends Engineer, structural: More than 4 times per year    Marital Status: Married     Vital Signs: There were no vitals taken for this visit.  Physical Exam Constitutional:      Appearance: Normal appearance.  HENT:     Head: Normocephalic.  Pulmonary:     Effort: Pulmonary effort is normal.  Skin:    General: Skin is warm and dry.     Comments: Drain site clean, dry, without redness or signs of infection  Suture/statlock in place  Dressed appropriately   Neurological:     Mental Status: He is alert and oriented to person, place, and time.  Psychiatric:        Mood and Affect: Mood normal.        Behavior: Behavior normal.     Imaging: DG Ribs Unilateral W/Chest Left Result Date: 08/04/2023 CLINICAL DATA:  fall on left side of abdomen with tenderness to palpation EXAM: LEFT RIBS AND CHEST - 3+ VIEW COMPARISON:  July 09, 2023 FINDINGS: Right PICC terminates at the cavoatrial junction. No focal airspace consolidation, pleural effusion, or pneumothorax. Calcified lymph node in the right mediastinum. No cardiomegaly. No acute, displaced rib fracture or destructive lesion. Multilevel degenerative disc disease  of the spine. IMPRESSION: 1. No acute, displaced rib fracture. Otherwise, clear lungs. 2. Right  PICC terminates at the cavoatrial junction. Electronically Signed   By: Rogelia Myers M.D.   On: 08/04/2023 12:55   DG Abd 2 Views Result Date: 08/04/2023 CLINICAL DATA:  fall on left side of abdomen with tenderness to palpation EXAM: ABDOMEN - 2 VIEW COMPARISON:  December 06, 2022, July 31, 2023 FINDINGS: Nonobstructive bowel gas pattern. No pneumoperitoneum. No organomegaly or radiopaque calculi. Similarly positioned percutaneous abscess drain overlying the right iliac wing. Brachytherapy seeds filling the prostate bed. No acute fracture or destructive lesion. Multilevel degenerative disc disease of the spine. The lung bases are clear. IMPRESSION: Nonobstructive bowel gas pattern. Electronically Signed   By: Rogelia Myers M.D.   On: 08/04/2023 12:42    Labs:  CBC: Recent Labs    07/16/23 0618 07/18/23 0611 07/30/23 1527 08/04/23 1055  WBC 14.7* 8.0 7.8 5.9  HGB 9.5* 10.2* 10.6* 11.0*  HCT 30.0* 31.7* 31.1* 32.8*  PLT 253 293 150 106*    COAGS: Recent Labs    12/03/22 1850 12/08/22 0834 07/09/23 0954 07/10/23 0509 07/11/23 0738 07/12/23 0644  INR 1.5*   < > 1.5* 1.4* 1.4* 1.4*  APTT 32  --   --   --   --   --    < > = values in this interval not displayed.    BMP: Recent Labs    07/16/23 0618 07/18/23 0611 07/30/23 1527 08/04/23 1055  NA 134* 135 140 140  K 3.4* 3.7 4.3 4.6  CL 99 98 107 104  CO2 24 28 27 30   GLUCOSE 122* 131* 166* 120*  BUN 16 14 19 13   CALCIUM  8.6* 8.5* 8.7* 9.3  CREATININE 0.85 0.82 0.94 0.95  GFRNONAA >60 >60 >60 >60    LIVER FUNCTION TESTS: Recent Labs    07/15/23 1156 07/18/23 0611 07/30/23 1527 08/04/23 1055  BILITOT 1.0 0.9 0.6 0.9  AST 18 23 20 18   ALT 15 16 11 10   ALKPHOS 103 121 117 108  PROT 5.8* 6.1* 6.1* 6.6  ALBUMIN  2.1* 2.2* 3.4* 3.9    Assessment:  Right retroperitoneal abscess s/p drain placement 07/13/23 by Dr. Hughes with exchange and upsize to 14 Fr drain 07/16/23  07/31/23 Assessment:       - output  declined from 50 mL to 20 mL per day       - CT A/P revealed improvement without residual collection      - discontinue flushes, return on 08/06/23 for output evaluation and possible drain removal   Mr. Purk was seen today with his wife for follow up on his right retroperitoneal abscess drain.    Output is <5-10 mL per day, clear yellow, thin in nature.   Pt continues to receive IV antibiotics via picc line.    With the continued decrease in output from Mr. Krasinski drain, removal was recommended.  Mr. Balthazor and his wife agree.    Using a suture removal kit, the external suture of the right retroperitoneal abscess drain was cut and removed.  The 18 F drain was subsequently cut, releasing the internal suture, and removed without complication.    The right retroperitoneal drain site was cleaned and dressed appropriately.   Please follow up as needed.    Signed: Lavanda JAYSON Jurist, PA-C 08/05/2023, 11:59 AM

## 2023-08-06 ENCOUNTER — Other Ambulatory Visit: Payer: Self-pay | Admitting: *Deleted

## 2023-08-06 ENCOUNTER — Encounter: Payer: Self-pay | Admitting: Hematology and Oncology

## 2023-08-06 ENCOUNTER — Ambulatory Visit: Payer: Self-pay | Admitting: Physician Assistant

## 2023-08-06 NOTE — Patient Instructions (Signed)
 Visit Information  Thank you for taking time to visit with me today. Please don't hesitate to contact me if I can be of assistance to you before our next scheduled telephone appointment.  Our next appointment is by telephone on Wednesday 08/12/23 at 11:00 am  Please call the care guide team at [phone] if you need to cancel or reschedule your appointment.   Following are the goals we discussed today:  Patient Self Care Activities:  Attend all scheduled provider appointments Call provider office for new concerns or questions  Participate in Transition of Care Program/Attend TOC scheduled calls Continue working with the home health team that is involved in your care Continue pacing activity as your recuperation from your recent hospital visit continues Use assistive devices (walker) as needed to prevent falls Continue monitoring and recording your blood sugars at home If you believe your condition is getting worse- contact your care providers (doctors) promptly- reaching out to your doctor early when you have concerns can prevent you from having to go to the hospital  If you are experiencing a Mental Health or Behavioral Health Crisis or need someone to talk to, please  call the Suicide and Crisis Lifeline: 988 call the [place]  National Suicide Prevention Lifeline: 2676781469 or TTY: 8316300558 TTY 403-014-3106) to talk to a trained counselor call 1-800-273-TALK (toll free, 24 hour hotline) go to [facility] [address] [place] [id number]) call the [facility] Crisis Line: [phone] call 911   Patient verbalizes understanding of instructions and care plan provided today and agrees to view in MyChart. Active MyChart status and patient understanding of how to access instructions and care plan via MyChart confirmed with patient.     Markee Matera Mckinney Anglea Gordner, RN, BSN, Media planner  Transitions of Care  [facility] -  [facility] [phone]: direct office

## 2023-08-06 NOTE — Telephone Encounter (Signed)
 Unable to reach Patrick Kidd. LMTRC. Did speak with the patient an he states that he is fine and doesn't need an appointment. I advised him once we talk to JIM I will let Dr. Joshua let us  know if he needs to be seen or not.

## 2023-08-06 NOTE — Telephone Encounter (Signed)
 Patient has already come and pick up his stool cards.

## 2023-08-06 NOTE — Transitions of Care (Post Inpatient/ED Visit) (Signed)
 Transition of Care week 3/ day # 15  Visit Note  08/06/2023  Name: Patrick Kidd MRN: [id number]          DOB: 1945-08-02  Situation: Patient enrolled in [facility] 30-day program. Visit completed with patient by telephone.   HIPAA identifiers x 2 verified  Background:  Recent hospitalization June 5-26, 2025 for  SIRS/ fever- retroperitoneal hematoma with abscess- discharged with abdominal drain placement/ IV antibiotic infusion (2) unplanned hospital admissions x last 12 months; (1) x last 6 months  Initial Transition Care Management Follow-up Telephone Call    Past Medical History:  Diagnosis Date   Acrophobia    Alcoholic cirrhosis (HCC) 10/04/2015   Anemia    Arthritis    foot by big toe   BPH associated with nocturia    Cataract    removed both eyes   Cholecystitis 05/2022   tx Abx   Chronic cough    PMH of   CLL (chronic lymphocytic leukemia) (HCC)    COVID-19 12/2018   Diabetes mellitus without complication (HCC)    Diverticulosis 07/03/2010   Colonoscopy.    Duodenal ulcer 2017   Fallen arches    Bilateral   GERD (gastroesophageal reflux disease)    Gout    Granuloma annulare    Hx of adenomatous colonic polyps multiple   Hydrocele 2011   Large septated right hydrocele   Liver cyst    Liver lesion    Nonspecific elevation of levels of transaminase or lactic acid dehydrogenase (LDH)    Obesity    Peripheral neuropathy    Plantar fasciitis    PMH of   Portal hypertension (HCC) 2017   Prostate cancer (HCC)    Right shoulder pain 11/2017   Sleep apnea    no cpap, patient denies   Thiamine  deficiency    Wears reading eyeglasses    WPW (Wolff-Parkinson-White syndrome) 02/05/2019   Assessment:  I got the drain and the PICC line removed yesterday, so I am a free man!  Doing good, getting better and back to my normal self every day.  Antibiotics have been done now for several days.  Yes, I had a fall on 07/31/23- just accidentally tripped on some furniture-  I was not hurt, they checked me out good and did X-Rays.  Blood sugars looking fine, like they normally do.  Getting back to my normal self, looking forward to getting back in the pool in a few days when they said it was okay to do that;    Denies clinical concerns and sounds to be in no distress throughout [facility] 30-day program outreach call today  Patient Reported Symptoms: Cognitive Cognitive Status: Alert and oriented to person, place, and time, Normal speech and language skills, Insightful and able to interpret abstract concepts Cognitive/Intellectual Conditions Management [RPT]: None reported or documented in medical history or problem list      Neurological Neurological Review of Symptoms: No symptoms reported    HEENT HEENT Symptoms Reported: No symptoms reported      Cardiovascular Cardiovascular Symptoms Reported: No symptoms reported Does patient have uncontrolled Hypertension?: No Cardiovascular Management Strategies: Adequate rest, Routine screening, Coping strategies  Respiratory Respiratory Symptoms Reported: No symptoms reported Other Respiratory Symptoms: Denies shortness of breath/ cough and sounds to be in no respiratory distress throughout [facility] call    Endocrine Endocrine Symptoms Reported: No symptoms reported Is patient diabetic?: Yes Is patient checking blood sugars at home?: Yes List most recent blood sugar readings, include date  and time of day: Blood sugar this morning before eating was 114; After eating is is usually 140 or less; confirmed continnues monitoring blood sugars at home QD- alternates fasting and post-prandial monitoring    Gastrointestinal Gastrointestinal Symptoms Reported: No symptoms reported Other Gastrointestinal Symptoms: Reports ongoing really good appetite; reports normal and regular bowel movements; confirmed abdominal drain removed 08/05/23      Genitourinary Genitourinary Symptoms Reported: No symptoms reported Additional  Genitourinary Details: Reports continues urinating with no problems, in good amounts; urine is clear yellow Genitourinary Management Strategies: Coping strategies  Integumentary Integumentary Symptoms Reported: No symptoms reported Other Integumentary Symptoms: Confirmed PICC line and abdominal drain removed 08/05/23: The sites look fine, no drainage, no problems.  The folks in the IR department told me it looks good; discussed signs/ symptoms infection along with corresponding action plan should concerns develop; confirmed has completed course of IV antibiotics this week Skin Management Strategies: Routine screening, Coping strategies  Musculoskeletal Musculoskelatal Symptoms Reviewed: Limited mobility, Unsteady gait Additional Musculoskeletal Details: confirmed currently requiring/ using assistive devices for ambulation - walker: reports new fall without injury on 07/31/23: I tripped on some furniture walking to the bathroom; Reports, they checked me out real good and did x-rays, I was not seriously injured provided education/ reinforcement around fall prevention Musculoskeletal Management Strategies: Medical device, Routine screening, Coping strategies, Adequate rest Falls in the past year?: Yes Number of falls in past year: 1 or less Was there an injury with Fall?: No Fall Risk Category Calculator: 1 Patient Fall Risk Level: Low Fall Risk Patient at Risk for Falls Due to: Impaired mobility, Medication side effect Fall risk Follow up: Falls prevention discussed, Education provided  Psychosocial Psychosocial Symptoms Reported: No symptoms reported         There were no vitals filed for this visit.  Medications Reviewed Today     Reviewed by Shenita Trego M, RN (Registered Nurse) on 08/06/23 at 1310  Med List Status: <None>   Medication Order Taking? Sig Documenting Provider Last Dose Status Informant  ertapenem  (INVANZ ) IVPB [id number]  Inject 1 g into the vein daily for 19 days.  Indication:  RETROPERITONEAL ABSCESS First Dose: Yes Last Day of Therapy:  08/06/23 Labs - Once weekly:  CBC/D and BMP, Labs - Once weekly: ESR and CRP Method of administration: Mini-Bag Plus / Gravity Method of administration may be changed at the discretion of home infusion pharmacist based upon assessment of the patient and/or caregiver's ability to self-administer the medication ordered.  Patient not taking: Reported on 08/06/2023   Dennise Kingsley, MD  Active   gabapentin  (NEURONTIN ) 300 MG capsule [id number] Yes Take 1 capsule (300 mg total) by mouth 3 (three) times daily. Joshua Debby CROME, MD  Active Self, Pharmacy Records  insulin  glargine, 2 Unit Dial , (TOUJEO  MAX SOLOSTAR) 300 UNIT/ML Solostar Pen [id number] Yes Inject 12 Units into the skin daily. Via SANOFI pt assistance Joshua Debby CROME, MD  Active Self, Pharmacy Records  Insulin  Pen Needle (BD PEN NEEDLE MINI ULTRAFINE) 31G X 5 MM MISC [id number]  INJECT 1 ACT INTO THE SKIN DAILY. USE TO ADMINISTER INSULIN . DX E11.9   Strength: 31G X 5 MM Joshua Debby CROME, MD  Active Self, Pharmacy Records  metFORMIN  (GLUCOPHAGE ) 500 MG tablet [id number] Yes TAKE 1 TABLET BY MOUTH TWICE A DAY WITH FOOD Joshua Debby CROME, MD  Active Self, Pharmacy Records  Multiple Vitamins-Minerals (CENTRUM SILVER 50+MEN) TABS [id number] Yes Take 1 tablet by mouth daily. [provider]  Active Self,  Pharmacy Records  pantoprazole  (PROTONIX ) 40 MG tablet [id number] Yes TAKE 1 TABLET BY MOUTH 2 TIMES DAILY 30 MINUTES BEFORE MEALS (BREAKFAST AND SUPPER) Joshua Debby CROME, MD  Active Self, Pharmacy Records  traMADol  (ULTRAM ) 50 MG tablet [id number]  Take 1 tablet (50 mg total) by mouth every 12 (twelve) hours as needed for severe pain (pain score 7-10).  Patient not taking: Reported on 08/06/2023   Sonjia Held, MD  Active            Recommendation:   Continue Current Plan of Care  Follow Up Plan:   Telephone follow-up in 1 week- as scheduled 08/12/23  Plan for next  week's call: Assess PICC/ drain sites post discontinuation on 08/05/23 Assess medication adherence Review home health PT visits- use of assistive devices; ensure no new falls post- fall without injury on 07/31/23 Review home blood sugar values from home monitoring: every day: alternates between fasting/ post-prandial monitoring Reinforce education re: signs/ symptoms infection along with action plan  Pls call/ message for questions,  Marcy Sookdeo Mckinney Mohanad Carsten, RN, BSN, CCRN Alumnus RN Care Manager  Transitions of Care  [facility] - [facility] [phone]: direct office

## 2023-08-12 ENCOUNTER — Other Ambulatory Visit: Payer: Self-pay | Admitting: *Deleted

## 2023-08-12 NOTE — Patient Instructions (Signed)
 Visit Information  Thank you for taking time to visit with me today. Please don't hesitate to contact me if I can be of assistance to you before our next scheduled telephone appointment.  It has been a pleasure working with you over the last 30 days!  Great job managing your care after your hospital visit!  I am glad that you are doing well!  Please do not hesitate to contact me in the future if I can be of assistance to you!  Following are the goals we discussed today:  Patient Self Care Activities:  Attend all scheduled provider appointments Call provider office for new concerns or questions  Continue pacing activity as your recuperation from your recent hospital visit continues Use assistive devices (cane) as needed to prevent falls Continue monitoring and recording your blood sugars at home If you believe your condition is getting worse- contact your care providers (doctors) promptly- reaching out to your doctor early when you have concerns can prevent you from having to go to the hospital  If you are experiencing a Mental Health or Behavioral Health Crisis or need someone to talk to, please  call the Suicide and Crisis Lifeline: 988 call the [place]  National Suicide Prevention Lifeline: (774) 709-5536 or TTY: 754-856-6488 TTY 229-861-8787) to talk to a trained counselor call 1-800-273-TALK (toll free, 24 hour hotline) go to [facility] [address], [place] [phone]) call the [facility] Crisis Line: [phone] call 911   Patient verbalizes understanding of instructions and care plan provided today and agrees to view in MyChart. Active MyChart status and patient understanding of how to access instructions and care plan via MyChart confirmed with patient.     Amiaya Mcneeley Mckinney Levette Paulick, RN, BSN, Media planner  Transitions of Care  [facility] - [facility] [phone]: direct office

## 2023-08-12 NOTE — Transitions of Care (Post Inpatient/ED Visit) (Signed)
 Transition of Care week 4/ day # 21  Visit Note  08/12/2023  Name: Patrick Kidd MRN: [id number]          DOB: Nov 03, 1945  Situation: Patient enrolled in [facility] 30-day program. Visit completed with patient by telephone.   Background:  Recent hospitalization June 5-26, 2025 for  SIRS/ fever- retroperitoneal hematoma with abscess- discharged with abdominal drain placement/ IV antibiotic infusion (2) unplanned hospital admissions x last 12 months; (1) x last 6 months Independent at baseline  Patient declined ongoing TOC outreaches- TOC 30 day program case closure accordingly; declined ongoing follow up with longitudinal RN CM   Initial Transition Care Management Follow-up Telephone Call    Past Medical History:  Diagnosis Date   Acrophobia    Alcoholic cirrhosis (HCC) 10/04/2015   Anemia    Arthritis    foot by big toe   BPH associated with nocturia    Cataract    removed both eyes   Cholecystitis 05/2022   tx Abx   Chronic cough    PMH of   CLL (chronic lymphocytic leukemia) (HCC)    COVID-19 12/2018   Diabetes mellitus without complication (HCC)    Diverticulosis 07/03/2010   Colonoscopy.    Duodenal ulcer 2017   Fallen arches    Bilateral   GERD (gastroesophageal reflux disease)    Gout    Granuloma annulare    Hx of adenomatous colonic polyps multiple   Hydrocele 2011   Large septated right hydrocele   Liver cyst    Liver lesion    Nonspecific elevation of levels of transaminase or lactic acid dehydrogenase (LDH)    Obesity    Peripheral neuropathy    Plantar fasciitis    PMH of   Portal hypertension (HCC) 2017   Prostate cancer (HCC)    Right shoulder pain 11/2017   Sleep apnea    no cpap, patient denies   Thiamine  deficiency    Wears reading eyeglasses    WPW (Wolff-Parkinson-White syndrome) 02/05/2019   Assessment:  I am doing great- back to my normal self now that this drain is out and the IV's are over.  Having no issues; home health signed off  yesterday- and gave me a great report, said they agree that I am doing great; no longer having to use the walker- using cane now only.  I've loved talking to you and am so grateful for the calls every week- but it's a hard NO to more calls- I'm going to be at the beach on vacation in a boat all of next week.  I don't need any more calls, but I will keep your number if I need something in the future    Denies clinical concerns and sounds to be in no distress throughout [facility] 30-day program outreach call today  Patient Reported Symptoms: Cognitive Cognitive Status: Alert and oriented to person, place, and time, Normal speech and language skills, Insightful and able to interpret abstract concepts Cognitive/Intellectual Conditions Management [RPT]: None reported or documented in medical history or problem list   Health Maintenance Behaviors: Annual physical exam, Healthy diet  Neurological Neurological Review of Symptoms: No symptoms reported    HEENT HEENT Symptoms Reported: No symptoms reported      Cardiovascular Cardiovascular Symptoms Reported: No symptoms reported Does patient have uncontrolled Hypertension?: No Cardiovascular Management Strategies: Routine screening, Diet modification, Coping strategies, Adequate rest  Respiratory Respiratory Symptoms Reported: No symptoms reported Other Respiratory Symptoms: Denies shortness of breath/ cough and sounds  to be in no respiratory distress throughout [facility] call    Endocrine Endocrine Symptoms Reported: No symptoms reported Is patient diabetic?: Yes Is patient checking blood sugars at home?: Yes List most recent blood sugar readings, include date and time of day: Reports consistent fasting values between 114-125; post-prandial values between 140-160; confirmed continues using 12 U Tojeo insulin  QD; continues monitoring/ recording blood sugars at home QD: alternates between fasting and post-prandial blood sugar checks at home Endocrine  Self-Management Outcome: 4 (good)  Gastrointestinal Gastrointestinal Symptoms Reported: No symptoms reported Other Gastrointestinal Symptoms: Reports ongoing great appetite; reports normal and regular BM's, no constipation Additional Gastrointestinal Details: Confirms no issues from where the drain was; the nurse came out yesterday and said the site is healed Gastrointestinal Management Strategies: Coping strategies    Genitourinary Genitourinary Symptoms Reported: No symptoms reported, Frequency (chronic urinary frequency: confirmed followed by urology provider) Additional Genitourinary Details: Continues to report urinating normally in good amounts, looks normal, clear yellow urine; confirms plans to attend upcoming routine urology provider office visit as scheduled on 09/15/23 Genitourinary Management Strategies: Coping strategies  Integumentary Integumentary Symptoms Reported: No symptoms reported Other Integumentary Symptoms: Reports sites where PICC and abdominal drain were discontinued last week look fine, are healed up    Musculoskeletal Additional Musculoskeletal Details: confirmed currently requiring/ using assistive devices for ambulation - cane: reports I have completely graduated from the walker, hope to be done with this cane soon Musculoskeletal Management Strategies: Medical device, Coping strategies, Routine screening, Adequate rest   Fall risk Follow up: Falls prevention discussed  Psychosocial Psychosocial Symptoms Reported: No symptoms reported         There were no vitals filed for this visit.  Medications Reviewed Today     Reviewed by Katharine Rochefort M, RN (Registered Nurse) on 08/12/23 at 1100  Med List Status: <None>   Medication Order Taking? Sig Documenting Provider Last Dose Status Informant  gabapentin  (NEURONTIN ) 300 MG capsule [id number]  Take 1 capsule (300 mg total) by mouth 3 (three) times daily. Joshua Debby CROME, MD  Active Self, Pharmacy Records   insulin  glargine, 2 Unit Dial , (TOUJEO  MAX SOLOSTAR) 300 UNIT/ML Solostar Pen [id number]  Inject 12 Units into the skin daily. Via SANOFI pt assistance Joshua Debby CROME, MD  Active Self, Pharmacy Records  Insulin  Pen Needle (BD PEN NEEDLE MINI ULTRAFINE) 31G X 5 MM MISC [id number]  INJECT 1 ACT INTO THE SKIN DAILY. USE TO ADMINISTER INSULIN . DX E11.9   Strength: 31G X 5 MM Joshua Debby CROME, MD  Active Self, Pharmacy Records  metFORMIN  (GLUCOPHAGE ) 500 MG tablet [id number]  TAKE 1 TABLET BY MOUTH TWICE A DAY WITH FOOD Joshua Debby CROME, MD  Active Self, Pharmacy Records  Multiple Vitamins-Minerals (CENTRUM SILVER 50+MEN) TABS [id number]  Take 1 tablet by mouth daily. [provider]  Active Self, Pharmacy Records  pantoprazole  (PROTONIX ) 40 MG tablet [id number]  TAKE 1 TABLET BY MOUTH 2 TIMES DAILY 30 MINUTES BEFORE MEALS (BREAKFAST AND SUPPER) Joshua Debby CROME, MD  Active Self, Pharmacy Records  traMADol  (ULTRAM ) 50 MG tablet [id number]  Take 1 tablet (50 mg total) by mouth every 12 (twelve) hours as needed for severe pain (pain score 7-10).  Patient not taking: Reported on 08/06/2023   Sonjia Held, MD  Active            Recommendation:   Continue Current Plan of Care  Follow Up Plan:   Closing From:  Transitions of Care Program Patient has met all care  management goals. Care Management case will be closed. Patient has been provided contact information should new needs arise- he declines ongoing need/ desire for additional scheduled [facility] care management outreach  Pls call/ message for questions,  Verleen Stuckey Mckinney Hayzel Ruberg, RN, BSN, CCRN Alumnus RN Care Manager  Transitions of Care  [facility] - [facility] [phone]: direct office

## 2023-08-20 DIAGNOSIS — M47816 Spondylosis without myelopathy or radiculopathy, lumbar region: Secondary | ICD-10-CM | POA: Diagnosis not present

## 2023-08-24 ENCOUNTER — Ambulatory Visit (INDEPENDENT_AMBULATORY_CARE_PROVIDER_SITE_OTHER)

## 2023-08-24 VITALS — Ht 74.0 in | Wt 208.0 lb

## 2023-08-24 DIAGNOSIS — Z0000 Encounter for general adult medical examination without abnormal findings: Secondary | ICD-10-CM

## 2023-08-24 NOTE — Progress Notes (Signed)
 Subjective:   Patrick Kidd is a 78 y.o. who presents for a Medicare Wellness preventive visit.  As a reminder, Annual Wellness Visits don't include a physical exam, and some assessments may be limited, especially if this visit is performed virtually. We may recommend an in-person follow-up visit with your provider if needed.  Visit Complete: Virtual I connected with  Patrick Kidd on 08/24/23 by a audio enabled telemedicine application and verified that I am speaking with the correct person using two identifiers.  Patient Location: Home  Provider Location: Home Office  I discussed the limitations of evaluation and management by telemedicine. The patient expressed understanding and agreed to proceed.  Vital Signs: Because this visit was a virtual/telehealth visit, some criteria may be missing or patient reported. Any vitals not documented were not able to be obtained and vitals that have been documented are patient reported.  VideoDeclined- This patient declined Librarian, academic. Therefore the visit was completed with audio only.  Persons Participating in Visit: Patient.  AWV Questionnaire: No: Patient Medicare AWV questionnaire was not completed prior to this visit.  Cardiac Risk Factors include: advanced age (>58men, >67 women);hypertension;diabetes mellitus;dyslipidemia;Other (see comment), Risk factor comments: BPH, PAF, OSA     Objective:    Today's Vitals   08/24/23 1113  Weight: 208 lb (94.3 kg)  Height: 6' 2 (1.88 m)   Body mass index is 26.71 kg/m.     08/24/2023   11:26 AM 07/09/2023    8:16 PM 07/09/2023    2:13 PM 05/05/2023   11:56 AM 12/09/2022    9:13 AM 12/06/2022   10:00 AM 12/02/2022    4:45 AM  Advanced Directives  Does Patient Have a Medical Advance Directive? Yes Yes Yes Yes No No No  Type of Estate agent of [place];Living will Healthcare Power of [place];Living will Healthcare Power of  [place];Living will Living will;Healthcare Power of Attorney     Does patient want to make changes to medical advance directive? No - Patient declined No - Patient declined       Copy of Healthcare Power of Attorney in Chart? Yes - validated most recent copy scanned in chart (See row information) No - copy requested       Would patient like information on creating a medical advance directive?     No - Patient declined No - Patient declined No - Patient declined    Current Medications (verified) Outpatient Encounter Medications as of 08/24/2023  Medication Sig   gabapentin  (NEURONTIN ) 300 MG capsule Take 1 capsule (300 mg total) by mouth 3 (three) times daily.   insulin  glargine, 2 Unit Dial , (TOUJEO  MAX SOLOSTAR) 300 UNIT/ML Solostar Pen Inject 12 Units into the skin daily. Via SANOFI pt assistance   Insulin  Pen Needle (BD PEN NEEDLE MINI ULTRAFINE) 31G X 5 MM MISC INJECT 1 ACT INTO THE SKIN DAILY. USE TO ADMINISTER INSULIN . DX E11.9   Strength: 31G X 5 MM   metFORMIN  (GLUCOPHAGE ) 500 MG tablet TAKE 1 TABLET BY MOUTH TWICE A DAY WITH FOOD   Multiple Vitamins-Minerals (CENTRUM SILVER 50+MEN) TABS Take 1 tablet by mouth daily.   pantoprazole  (PROTONIX ) 40 MG tablet TAKE 1 TABLET BY MOUTH 2 TIMES DAILY 30 MINUTES BEFORE MEALS (BREAKFAST AND SUPPER)   traMADol  (ULTRAM ) 50 MG tablet Take 1 tablet (50 mg total) by mouth every 12 (twelve) hours as needed for severe pain (pain score 7-10).   No facility-administered encounter medications on file as of  08/24/2023.    Allergies (verified) Rifaximin , Beta adrenergic blockers, and Calcium  channel blockers   History: Past Medical History:  Diagnosis Date   Acrophobia    Alcoholic cirrhosis (HCC) 10/04/2015   Anemia    Arthritis    foot by big toe   BPH associated with nocturia    Cataract    removed both eyes   Cholecystitis 05/2022   tx Abx   Chronic cough    PMH of   CLL (chronic lymphocytic leukemia) (HCC)    COVID-19 12/2018   Diabetes  mellitus without complication (HCC)    Diverticulosis 07/03/2010   Colonoscopy.    Duodenal ulcer 2017   Fallen arches    Bilateral   GERD (gastroesophageal reflux disease)    Gout    Granuloma annulare    Hx of adenomatous colonic polyps multiple   Hydrocele 2011   Large septated right hydrocele   Liver cyst    Liver lesion    Nonspecific elevation of levels of transaminase or lactic acid dehydrogenase (LDH)    Obesity    Peripheral neuropathy    Plantar fasciitis    PMH of   Portal hypertension (HCC) 2017   Prostate cancer (HCC)    Right shoulder pain 11/2017   Sleep apnea    no cpap, patient denies   Thiamine  deficiency    Wears reading eyeglasses    WPW (Wolff-Parkinson-White syndrome) 02/05/2019   Past Surgical History:  Procedure Laterality Date   BACK SURGERY  04/28/2022   CATARACT EXTRACTION, BILATERAL  12/2011   Dr Roz   CHOLECYSTECTOMY, ROBOT-ASSISTED, LAPAROSCOPIC  03/2023   [facility]   COLONOSCOPY  2017   COLONOSCOPY W/ POLYPECTOMY  07/03/2010   2 adenomas, diverticulosis on right. Dr Avram   CYSTOSCOPY N/A 03/15/2018   Procedure: PHYLLIS SIDE;  Surgeon: Carolee Sherwood JONETTA DOUGLAS, MD;  Location: [facility];  Service: Urology;  Laterality: N/A;  NO SEEDS FOUND IN BLADDER   ESOPHAGOGASTRODUODENOSCOPY (EGD) WITH PROPOFOL  N/A 01/08/2016   Procedure: ESOPHAGOGASTRODUODENOSCOPY (EGD) WITH PROPOFOL ;  Surgeon: Gwendlyn ONEIDA Buddy, MD;  Location: [facility];  Service: Endoscopy;  Laterality: N/A;   FLEXIBLE SIGMOIDOSCOPY  2000   IR EXCHANGE BILIARY DRAIN  02/17/2023   IR PATIENT EVAL TECH 0-60 MINS  12/26/2022   IR PERC CHOLECYSTOSTOMY  12/05/2022   IR PERC CHOLECYSTOSTOMY  12/09/2022   IR RADIOLOGIST EVAL & MGMT  01/16/2023   IR RADIOLOGIST EVAL & MGMT  07/31/2023   IR RADIOLOGIST EVAL & MGMT  08/05/2023   RADIOACTIVE SEED IMPLANT N/A 03/15/2018   Procedure: RADIOACTIVE SEED IMPLANT/BRACHYTHERAPY IMPLANT;  Surgeon: Carolee Sherwood JONETTA DOUGLAS, MD;  Location:  [facility];  Service: Urology;  Laterality: N/A;   43 SEEDS IMPLANTED   RADIOLOGY WITH ANESTHESIA N/A 12/09/2022   Procedure: IR WITH ANESTHESIA;  Surgeon: Radiologist, Medication, MD;  Location: [facility];  Service: Radiology;  Laterality: N/A;   SIGMOIDOSCOPY     SPACE OAR INSTILLATION N/A 03/15/2018   Procedure: SPACE OAR INSTILLATION;  Surgeon: Carolee Sherwood JONETTA DOUGLAS, MD;  Location: [facility];  Service: Urology;  Laterality: N/A;   WISDOM TOOTH EXTRACTION     Family History  Problem Relation Age of Onset   Lung cancer Mother        smoker   Leukemia Father        Acute myelocytic   Diabetes Neg Hx    Stroke Neg Hx    Heart disease Neg Hx  Colon cancer Neg Hx    Colon polyps Neg Hx    Esophageal cancer Neg Hx    Rectal cancer Neg Hx    Stomach cancer Neg Hx    Social History   Socioeconomic History   Marital status: Married    Spouse name: [name]   Number of children: 2   Years of education: Not on file   Highest education level: Some college, no degree  Occupational History   Occupation: Merchandiser, retail time  Tobacco Use   Smoking status: Former    Current packs/day: 0.00    Average packs/day: 2.0 packs/day for 6.0 years (12.0 ttl pk-yrs)    Types: Cigarettes    Start date: 02/03/1965    Quit date: 02/04/1971    Years since quitting: 52.5    Passive exposure: Never   Smokeless tobacco: Never   Tobacco comments:    smoked age [age], up to 2 ppd  Vaping Use   Vaping status: Never Used  Substance and Sexual Activity   Alcohol use: Not Currently    Comment: No alochol since Christmas Day 2020   Drug use: No   Sexual activity: Yes    Partners: Female  Other Topics Concern   Not on file  Social History Narrative   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 He lives  with his wife in a one level home. He swims most days. 2025   Social Drivers of Corporate investment banker Strain: Low Risk  (08/24/2023)   Overall Financial Resource Strain (CARDIA)    Difficulty of Paying Living Expenses: Not hard at all  Food Insecurity: No Food Insecurity (08/24/2023)   Hunger Vital Sign    Worried About Running Out of Food in the Last Year: Never true    Ran Out of Food in the Last Year: Never true  Transportation Needs: No Transportation Needs (08/24/2023)   PRAPARE - Administrator, Civil Service (Medical): No    Lack of Transportation (Non-Medical): No  Physical Activity: Insufficiently Active (08/24/2023)   Exercise Vital Sign    Days of Exercise per Week: 1 day    Minutes of Exercise per Session: 40 min  Stress: No Stress Concern Present (08/24/2023)   [employer] of Occupational Health - Occupational Stress Questionnaire    Feeling of Stress: Not at all  Social Connections: Moderately Isolated (08/24/2023)   Social Connection and Isolation Panel    Frequency of Communication with Friends and Family: More than three times a week    Frequency of Social Gatherings with Friends and Family: Three times a week    Attends Religious Services: Never    Active Member of Clubs or Organizations: No    Attends Banker Meetings: Never    Marital Status: Married    Tobacco Counseling Counseling given: Not Answered Tobacco comments: smoked age [age], up to 2 ppd    Clinical Intake:  Pre-visit preparation completed: Yes  Pain : No/denies pain     BMI - recorded: 26.71 Nutritional Status: BMI 25 -29 Overweight Nutritional Risks: None Diabetes: Yes CBG done?: No Did pt. bring in CBG monitor from home?: No  Lab Results  Component Value Date   HGBA1C 7.7 (H) 07/13/2023   HGBA1C 6.8 (H) 04/28/2023   HGBA1C 6.7 (H)  11/19/2022     How often do you need to have someone help you when you read instructions, pamphlets, or other  written materials from your doctor or pharmacy?: 1 - Never  Interpreter Needed?: No  Information entered by :: Eda Magnussen, RMA   Activities of Daily Living     08/24/2023   11:14 AM 07/09/2023    8:16 PM  In your present state of health, do you have any difficulty performing the following activities:  Hearing? 0 0  Vision? 0 0  Difficulty concentrating or making decisions? 0 0  Walking or climbing stairs? 0   Dressing or bathing? 0   Doing errands, shopping? 0 0  Preparing Food and eating ? N   Using the Toilet? N   In the past six months, have you accidently leaked urine? Y   Do you have problems with loss of bowel control? N   Managing your Medications? N   Managing your Finances? N   Housekeeping or managing your Housekeeping? N     Patient Care Team: Joshua Debby CROME, MD as PCP - General (Internal Medicine) Court Dorn PARAS, MD as PCP - Cardiology (Cardiology) Skeet Juliene SAUNDERS, DO as Consulting Physician (Neurology) Szabat, Toribio BROCKS, [facility] (Inactive) (Pharmacist) Carolee Heron NOVAK, RN as Case Manager Pa, [facility]  I have updated your Care Teams any recent Medical Services you may have received from other providers in the past year.     Assessment:   This is a routine wellness examination for [place].  Hearing/Vision screen Hearing Screening - Comments:: Denies hearing difficulties   Vision Screening - Comments:: Denies vision issues./Need a new one    Goals Addressed   None    Depression Screen     08/24/2023   11:28 AM 08/12/2023   11:00 AM 08/06/2023    1:00 PM 04/28/2023   11:13 AM 12/22/2022    2:31 PM 12/02/2021    1:27 PM 11/05/2021    2:31 PM  PHQ 2/9 Scores  PHQ - 2 Score 0 0 0 0 0 0 0  PHQ- 9 Score 0   0   0    Fall Risk     08/24/2023   11:26 AM 08/12/2023   11:22 AM 08/06/2023    1:00 PM 08/03/2023   10:14 AM 04/28/2023   11:13 AM  Fall Risk   Falls in the past year? 1  1 1  0  Number falls in past yr: 1  0 0 0  Injury with Fall? 0  0  0   Risk for fall due to : Impaired balance/gait  Impaired mobility;Medication side effect Impaired balance/gait No Fall Risks  Follow up Falls evaluation completed;Falls prevention discussed Falls prevention discussed Falls prevention discussed;Education provided Falls evaluation completed Falls evaluation completed    MEDICARE RISK AT HOME:  Medicare Risk at Home Any stairs in or around the home?: No If so, are there any without handrails?: No Home free of loose throw rugs in walkways, pet beds, electrical cords, etc?: Yes Adequate lighting in your home to reduce risk of falls?: Yes Life alert?: No Use of a cane, walker or w/c?: Yes (uses a cane sometimes) Grab bars in the bathroom?: Yes Shower chair or bench in shower?: Yes Elevated toilet seat or a handicapped toilet?: Yes  TIMED UP AND GO:  Was the test performed?  No  Cognitive Function: Declined/Normal: No cognitive concerns noted by patient or family. Patient alert, oriented, able to answer questions appropriately  and recall recent events. No signs of memory loss or confusion.    09/04/2015    2:07 PM  MMSE - Mini Mental State Exam  Not completed: --        12/02/2021    1:28 PM  6CIT Screen  What Year? 0 points  What month? 0 points  What time? 0 points  Count back from 20 0 points  Months in reverse 0 points  Repeat phrase 0 points  Total Score 0 points    Immunizations Immunization History  Administered Date(s) Administered   Fluad Quad(high Dose 65+) 01/30/2019, 11/23/2019, 11/29/2020, 11/05/2021   Fluad Trivalent(High Dose 65+) 12/22/2022   Hep A / Hep B 11/02/2015   Influenza, High Dose Seasonal PF 10/28/2016, 11/11/2017   Influenza,inj,Quad PF,6+ Mos 01/10/2016   Moderna SARS-COV2 Booster Vaccination 12/13/2019   Moderna Sars-Covid-2 Vaccination 02/13/2019, 03/16/2019   Pneumococcal Conjugate-13 09/04/2015   Pneumococcal Polysaccharide-23 06/05/2011, 03/14/2019   Respiratory Syncytial Virus  Vaccine,Recomb Aduvanted(Arexvy) 01/30/2022   Tdap 06/03/2009, 10/28/2016, 06/28/2019    Screening Tests Health Maintenance  Topic Date Due   Zoster Vaccines- Shingrix  (1 of 2) Never done   Hepatitis B Vaccines (2 of 3 - Risk 3-dose series) 11/30/2015   COVID-19 Vaccine (3 - Moderna risk series) 01/10/2020   OPHTHALMOLOGY EXAM  05/17/2022   Medicare Annual Wellness (AWV)  12/03/2022   INFLUENZA VACCINE  09/04/2023   HEMOGLOBIN A1C  01/12/2024   Diabetic kidney evaluation - Urine ACR  04/27/2024   FOOT EXAM  04/27/2024   Diabetic kidney evaluation - eGFR measurement  08/03/2024   DTaP/Tdap/Td (4 - Td or Tdap) 06/27/2029   Pneumococcal Vaccine: 50+ Years  Completed   Hepatitis C Screening  Completed   HPV VACCINES  Aged Out   Meningococcal B Vaccine  Aged Out   Colonoscopy  Discontinued    Health Maintenance  Health Maintenance Due  Topic Date Due   Zoster Vaccines- Shingrix  (1 of 2) Never done   Hepatitis B Vaccines (2 of 3 - Risk 3-dose series) 11/30/2015   COVID-19 Vaccine (3 - Moderna risk series) 01/10/2020   OPHTHALMOLOGY EXAM  05/17/2022   Medicare Annual Wellness (AWV)  12/03/2022   Health Maintenance Items Addressed: See Nurse Notes at the end of this note  Additional Screening:  Vision Screening: Recommended annual ophthalmology exams for early detection of glaucoma and other disorders of the eye. Would you like a referral to an eye doctor? Yes    Dental Screening: Recommended annual dental exams for proper oral hygiene  Community Resource Referral / Chronic Care Management: CRR required this visit?  No   CCM required this visit?  No   Plan:    I have personally reviewed and noted the following in the patient's chart:   Medical and social history Use of alcohol, tobacco or illicit drugs  Current medications and supplements including opioid prescriptions. Patient is not currently taking opioid prescriptions. Functional ability and status Nutritional  status Physical activity Advanced directives List of other physicians Hospitalizations, surgeries, and ER visits in previous 12 months Vitals Screenings to include cognitive, depression, and falls Referrals and appointments  In addition, I have reviewed and discussed with patient certain preventive protocols, quality metrics, and best practice recommendations. A written personalized care plan for preventive services as well as general preventive health recommendations were provided to patient.   Alper Guilmette L Demorris Choyce, CMA   08/24/2023   After Visit Summary: (MyChart) Due to this being a telephonic visit, the after visit  summary with patients personalized plan was offered to patient via MyChart   Notes: Patient stated that he had a diabetic eye exam at the beginning of the year with Dr. Roz.  I have sent a request of records to office today.  He declines any vaccines at this time.  Patient had no other concerns to address today.

## 2023-08-24 NOTE — Patient Instructions (Signed)
 [name] , Thank you for taking time out of your busy schedule to complete your Annual Wellness Visit with me. I enjoyed our conversation and look forward to speaking with you again next year. I, as well as your care team,  appreciate your ongoing commitment to your health goals. Please review the following plan we discussed and let me know if I can assist you in the future. Your Game plan/ To Do List    Follow up Visits: Next Medicare AWV with our clinical staff: 08/24/2024   Have you seen your provider in the last 6 months (3 months if uncontrolled diabetes)? Yes Next Office Visit with your provider: 08/31/2023.  Clinician Recommendations:  Aim for 30 minutes of exercise or brisk walking, 6-8 glasses of water, and 5 servings of fruits and vegetables each day. You are due for an eye exam.        This is a list of the screening recommended for you and due dates:  Health Maintenance  Topic Date Due   Zoster (Shingles) Vaccine (1 of 2) Never done   Hepatitis B Vaccine (2 of 3 - Risk 3-dose series) 11/30/2015   COVID-19 Vaccine (3 - Moderna risk series) 01/10/2020   Eye exam for diabetics  05/17/2022   Medicare Annual Wellness Visit  12/03/2022   Flu Shot  09/04/2023   Hemoglobin A1C  01/12/2024   Yearly kidney health urinalysis for diabetes  04/27/2024   Complete foot exam   04/27/2024   Yearly kidney function blood test for diabetes  08/03/2024   DTaP/Tdap/Td vaccine (4 - Td or Tdap) 06/27/2029   Pneumococcal Vaccine for age over 30  Completed   Hepatitis C Screening  Completed   HPV Vaccine  Aged Out   Meningitis B Vaccine  Aged Out   Colon Cancer Screening  Discontinued    Advanced directives: (In Chart) A copy of your advanced directives are scanned into your chart should your provider ever need it. Advance Care Planning is important because it:  [x]  Makes sure you receive the medical care that is consistent with your values, goals, and preferences  [x]  It provides guidance  to your family and loved ones and reduces their decisional burden about whether or not they are making the right decisions based on your wishes.  Follow the link provided in your after visit summary or read over the paperwork we have mailed to you to help you started getting your Advance Directives in place. If you need assistance in completing these, please reach out to us  so that we can help you!  See attachments for Preventive Care and Fall Prevention Tips.

## 2023-08-24 NOTE — Progress Notes (Signed)
 NEUROLOGY FOLLOW UP OFFICE NOTE  Patrick Kidd [id number]  Assessment/Plan:   Diabetic polyneuropathy Lumbar radiculopathy s/p surgery improved.   Gabapentin  300mg  in AM and 600mg  at bedtime (to better manage night time neuralgia) Follow up one year or as needed.   Subjective:  Patrick Kidd is a 78 y.o. right-handed male with DM 2 and lumbar spinal stenosis with radiculopathy who follows up for diabetic polyneuropathy.    UPDATE: Taking:  gabapentin  300mg  in AM and 600mg  at bedtime  If he misses a dose of gabapentin , he wakes up with feet on fire.  In middle of night, he now feels that his left big toe feels like his toenail is hitting a nerve.     HISTORY: Onset of symptoms began in 2010.  He describes initially experiencing numbness on the bottom of both feet, in which recently it has spread to the ankles. It is also associated with an uncomfortable tingling sensation. It is not painful but it is uncomfortable and usually bothers him at night.  He doesn't really notice it if he is active.  He is concerned about his balance.  The first time, he was getting out of a boat and felt that his right leg was not strong enough to hold his weight and he fell to his knees. The second time, he tripped over a chair and fell down to his knees.  He felt that his leg just gave out when it paired his weight. He is particularly concerned, because he does enjoy riding on a boat and dancing.  More recently, he stood up from a chair at a game and felt shaky and unsteady.  He has not been exercising as much due to fear of imbalance.  He denies any pain radiating down the legs.  He has pain in his right big toe after an 8 pound frozen duck breast fell on it.  He has tingling in the finger tips.   Labs from 08/04/12 revealed ESR 29, ANA negative, Lyme negative.  SPEP revealed possible faint restricted band in the gamma region, IFE revealed polyclonal gammopathy involving IgA and IgM.  Prior  labs last month revealed Hgb A1c 5.7, TSH 2.42, RPR non-reactive, serum glucose 131.  CBC and CMP otherwise unremarkable.  B12 from 5/12 was 1293.  Fasting glucose was 143 and 2 hour glucose tolerance test was 341.  However, he was diagnosed with type 2 diabetes after Hgb A1c from 01/09/16 was found to be 8.4.  PAST MEDICAL HISTORY: Past Medical History:  Diagnosis Date   Acrophobia    Alcoholic cirrhosis (HCC) 10/04/2015   Anemia    Arthritis    foot by big toe   BPH associated with nocturia    Cataract    removed both eyes   Cholecystitis 05/2022   tx Abx   Chronic cough    PMH of   CLL (chronic lymphocytic leukemia) (HCC)    COVID-19 12/2018   Diabetes mellitus without complication (HCC)    Diverticulosis 07/03/2010   Colonoscopy.    Duodenal ulcer 2017   Fallen arches    Bilateral   GERD (gastroesophageal reflux disease)    Gout    Granuloma annulare    Hx of adenomatous colonic polyps multiple   Hydrocele 2011   Large septated right hydrocele   Liver cyst    Liver lesion    Nonspecific elevation of levels of transaminase or lactic acid dehydrogenase (LDH)    Obesity    Peripheral neuropathy  Plantar fasciitis    PMH of   Portal hypertension (HCC) 2017   Prostate cancer (HCC)    Right shoulder pain 11/2017   Sleep apnea    no cpap, patient denies   Thiamine  deficiency    Wears reading eyeglasses    WPW (Wolff-Parkinson-White syndrome) 02/05/2019    MEDICATIONS: Current Outpatient Medications on File Prior to Visit  Medication Sig Dispense Refill   gabapentin  (NEURONTIN ) 300 MG capsule Take 1 capsule (300 mg total) by mouth 3 (three) times daily. 270 capsule 0   insulin  glargine, 2 Unit Dial , (TOUJEO  MAX SOLOSTAR) 300 UNIT/ML Solostar Pen Inject 12 Units into the skin daily. Via SANOFI pt assistance 12 mL 0   Insulin  Pen Needle (BD PEN NEEDLE MINI ULTRAFINE) 31G X 5 MM MISC INJECT 1 ACT INTO THE SKIN DAILY. USE TO ADMINISTER INSULIN . DX E11.9   Strength: 31G  X 5 MM 100 each 1   metFORMIN  (GLUCOPHAGE ) 500 MG tablet TAKE 1 TABLET BY MOUTH TWICE A DAY WITH FOOD 180 tablet 1   Multiple Vitamins-Minerals (CENTRUM SILVER 50+MEN) TABS Take 1 tablet by mouth daily.     pantoprazole  (PROTONIX ) 40 MG tablet TAKE 1 TABLET BY MOUTH 2 TIMES DAILY 30 MINUTES BEFORE MEALS (BREAKFAST AND SUPPER) 180 tablet 0   traMADol  (ULTRAM ) 50 MG tablet Take 1 tablet (50 mg total) by mouth every 12 (twelve) hours as needed for severe pain (pain score 7-10). 10 tablet 0   No current facility-administered medications on file prior to visit.    ALLERGIES: Allergies  Allergen Reactions   Rifaximin  Nausea And Vomiting    Required EMS visit, although patient did not go to hospital   Beta Adrenergic Blockers     Likely WPW.  Use with caution   Calcium  Channel Blockers     Likely WPW.  Use with caution.    FAMILY HISTORY: Family History  Problem Relation Age of Onset   Lung cancer Mother        smoker   Leukemia Father        Acute myelocytic   Diabetes Neg Hx    Stroke Neg Hx    Heart disease Neg Hx    Colon cancer Neg Hx    Colon polyps Neg Hx    Esophageal cancer Neg Hx    Rectal cancer Neg Hx    Stomach cancer Neg Hx       Objective:  Blood pressure (!) 161/78, pulse 88, height 6' 2 (1.88 m), weight 210 lb (95.3 kg), SpO2 98%. General: No acute distress.  Patient appears well-groomed.   Head:  Normocephalic/atraumatic Neck:  Supple.  No paraspinal tenderness.  Full range of motion. Heart:  Regular rate and rhythm. Neuro:  Alert and oriented.  Speech fluent and not dysarthric.  Language intact.  CN II-XII intact.  Bulk and tone normal.  Muscle strength 5/5 throughout.  Sensation to pinprick reduced up to mid-shins, vibratory sensation reduced up to below the knees.  Deep tendon reflexes absent throughout.  Finger to nose testing intact.  Wide-based gait.  Romberg with sway.   Juliene Dunnings, DO  CC: Debby Molt, MD

## 2023-08-25 ENCOUNTER — Ambulatory Visit (INDEPENDENT_AMBULATORY_CARE_PROVIDER_SITE_OTHER): Admitting: Neurology

## 2023-08-25 ENCOUNTER — Encounter: Payer: Self-pay | Admitting: Neurology

## 2023-08-25 VITALS — BP 161/78 | HR 88 | Ht 74.0 in | Wt 210.0 lb

## 2023-08-25 DIAGNOSIS — E1142 Type 2 diabetes mellitus with diabetic polyneuropathy: Secondary | ICD-10-CM

## 2023-08-25 DIAGNOSIS — M5416 Radiculopathy, lumbar region: Secondary | ICD-10-CM | POA: Diagnosis not present

## 2023-08-31 ENCOUNTER — Other Ambulatory Visit: Payer: Self-pay

## 2023-08-31 ENCOUNTER — Encounter: Payer: Self-pay | Admitting: Internal Medicine

## 2023-08-31 ENCOUNTER — Ambulatory Visit (INDEPENDENT_AMBULATORY_CARE_PROVIDER_SITE_OTHER): Admitting: Internal Medicine

## 2023-08-31 ENCOUNTER — Ambulatory Visit: Payer: Self-pay | Admitting: Internal Medicine

## 2023-08-31 VITALS — BP 146/78 | HR 84 | Temp 97.6°F | Ht 74.0 in | Wt 208.0 lb

## 2023-08-31 VITALS — BP 130/68 | HR 91 | Temp 98.9°F | Ht 74.0 in | Wt 208.0 lb

## 2023-08-31 DIAGNOSIS — E118 Type 2 diabetes mellitus with unspecified complications: Secondary | ICD-10-CM | POA: Diagnosis not present

## 2023-08-31 DIAGNOSIS — Z794 Long term (current) use of insulin: Secondary | ICD-10-CM | POA: Diagnosis not present

## 2023-08-31 DIAGNOSIS — E519 Thiamine deficiency, unspecified: Secondary | ICD-10-CM

## 2023-08-31 DIAGNOSIS — K6819 Other retroperitoneal abscess: Secondary | ICD-10-CM | POA: Diagnosis not present

## 2023-08-31 DIAGNOSIS — D539 Nutritional anemia, unspecified: Secondary | ICD-10-CM | POA: Insufficient documentation

## 2023-08-31 LAB — COMPLETE METABOLIC PANEL WITHOUT GFR
AG Ratio: 2 (calc) (ref 1.0–2.5)
ALT: 9 U/L (ref 9–46)
AST: 17 U/L (ref 10–35)
Albumin: 4.3 g/dL (ref 3.6–5.1)
Alkaline phosphatase (APISO): 83 U/L (ref 35–144)
BUN: 19 mg/dL (ref 7–25)
CO2: 28 mmol/L (ref 20–32)
Calcium: 9.4 mg/dL (ref 8.6–10.3)
Chloride: 105 mmol/L (ref 98–110)
Creat: 1.02 mg/dL (ref 0.70–1.28)
Globulin: 2.2 g/dL (ref 1.9–3.7)
Glucose, Bld: 120 mg/dL — ABNORMAL HIGH (ref 65–99)
Potassium: 4.7 mmol/L (ref 3.5–5.3)
Sodium: 140 mmol/L (ref 135–146)
Total Bilirubin: 0.9 mg/dL (ref 0.2–1.2)
Total Protein: 6.5 g/dL (ref 6.1–8.1)

## 2023-08-31 LAB — CBC WITH DIFFERENTIAL/PLATELET
Basophils Absolute: 0 K/uL (ref 0.0–0.1)
Basophils Relative: 0.2 % (ref 0.0–3.0)
Eosinophils Absolute: 0.1 K/uL (ref 0.0–0.7)
Eosinophils Relative: 1.3 % (ref 0.0–5.0)
HCT: 41.2 % (ref 39.0–52.0)
Hemoglobin: 13.9 g/dL (ref 13.0–17.0)
Lymphocytes Relative: 60.3 % — ABNORMAL HIGH (ref 12.0–46.0)
Lymphs Abs: 4 K/uL (ref 0.7–4.0)
MCHC: 33.7 g/dL (ref 30.0–36.0)
MCV: 92.8 fl (ref 78.0–100.0)
Monocytes Absolute: 0.3 K/uL (ref 0.1–1.0)
Monocytes Relative: 4.7 % (ref 3.0–12.0)
Neutro Abs: 2.2 K/uL (ref 1.4–7.7)
Neutrophils Relative %: 33.5 % — ABNORMAL LOW (ref 43.0–77.0)
Platelets: 121 K/uL — ABNORMAL LOW (ref 150.0–400.0)
RBC: 4.43 Mil/uL (ref 4.22–5.81)
RDW: 17.5 % — ABNORMAL HIGH (ref 11.5–15.5)
WBC: 6.6 K/uL (ref 4.0–10.5)

## 2023-08-31 LAB — HEMOGLOBIN A1C: Hgb A1c MFr Bld: 5.4 % (ref 4.6–6.5)

## 2023-08-31 MED ORDER — VITAMIN B-1 50 MG PO TABS: 50.0000 mg | ORAL_TABLET | Freq: Every day | ORAL | 1 refills | Status: AC

## 2023-08-31 NOTE — Progress Notes (Signed)
 Patient Active Problem List   Diagnosis Date Noted   Port-A-Cath in place 07/30/2023   Diarrhea of presumed infectious origin 07/29/2023   Retroperitoneal abscess (HCC) 07/29/2023   PICC (peripherally inserted central catheter) in place 07/29/2023   SIRS (systemic inflammatory response syndrome) (HCC) 07/09/2023   Hematoma 07/09/2023   OSA (obstructive sleep apnea) 03/17/2023   Thrombocytopenia (HCC) 03/17/2023   Urinary incontinence 06/25/2021   Degenerative lumbar spinal stenosis 04/30/2021   Degenerative arthritis of knee, bilateral 03/27/2021   Chronic lymphocytic leukemia (HCC) 11/08/2020   Insulin -requiring or dependent type II diabetes mellitus (HCC) 09/25/2020   Thiamine  deficiency    Paroxysmal atrial fibrillation (HCC) 07/19/2019   Allergic rhinitis 06/09/2019   Erectile dysfunction due to diabetes mellitus (HCC) 06/09/2019   Overweight with body mass index (BMI) of 28 to 28.9 in adult 03/17/2019   WPW (Wolff-Parkinson-White syndrome) 02/05/2019   Encephalopathy, hepatic (HCC)    Degenerative disc disease, cervical 07/21/2018   Cervical radiculopathy 07/16/2018   OAB (overactive bladder) 07/06/2018   Malignant neoplasm of prostate (HCC) 01/13/2018   Hyperlipidemia LDL goal <100 11/11/2017   Essential hypertension 11/11/2017   BPH associated with nocturia 11/11/2017   Erectile dysfunction due to arterial insufficiency 04/07/2017   Peripheral vascular disease (HCC) 06/25/2016   Alcoholic cirrhosis (HCC) 10/04/2015   Type 2 diabetes mellitus with complication, with long-term current use of insulin  (HCC) 08/22/2015   Hypersomnolence 03/19/2014   Peripheral neuropathy 01/11/2013   Polyclonal gammopathy 01/11/2013   GERD 10/04/2009   Benign prostatic hyperplasia without lower urinary tract symptoms 02/04/1999   Gout, unspecified 02/04/1999   Portal hypertension (HCC) 02/04/1999    Patient's Medications  New Prescriptions   No medications on file   Previous Medications   GABAPENTIN  (NEURONTIN ) 300 MG CAPSULE    Take 1 capsule (300 mg total) by mouth 3 (three) times daily.   INSULIN  GLARGINE, 2 UNIT DIAL , (TOUJEO  MAX SOLOSTAR) 300 UNIT/ML SOLOSTAR PEN    Inject 12 Units into the skin daily. Via SANOFI pt assistance   INSULIN  PEN NEEDLE (BD PEN NEEDLE MINI ULTRAFINE) 31G X 5 MM MISC    INJECT 1 ACT INTO THE SKIN DAILY. USE TO ADMINISTER INSULIN . DX E11.9   Strength: 31G X 5 MM   METFORMIN  (GLUCOPHAGE ) 500 MG TABLET    TAKE 1 TABLET BY MOUTH TWICE A DAY WITH FOOD   MULTIPLE VITAMINS-MINERALS (CENTRUM SILVER 50+MEN) TABS    Take 1 tablet by mouth daily.   PANTOPRAZOLE  (PROTONIX ) 40 MG TABLET    TAKE 1 TABLET BY MOUTH 2 TIMES DAILY 30 MINUTES BEFORE MEALS (BREAKFAST AND SUPPER)  Modified Medications   No medications on file  Discontinued Medications   No medications on file    Subjective: 78 year old male with past medical history of CLL, alcoholic, NASH cirrhosis with hepatic encephalopathy, CVA 2020, diabetes mellitus, history of polymicrobial bacteremia secondary to acute cholecystitis status post cholecystectomy presents for hospital follow-up of retroperitoneal abscess status post drain placement.  Patient admitted with abnormal CT which was done due to weakness and decreased appetite with GI.  MRI showed 14X hematoma versus abscess.  IR drained 16 cc of purulent fluid, placed drain with cultures growing Klebsiella pneumonia and Citrobacter.  He was discharged on ertapenem  to complete about 3 weeks of antibiotics from drain placement EOT 7/2.  Patient was seen by interventional radiology on 6/27 with CT showing improvement without residual fluid collection.  As patient was still having output  about 20 mL/day drain was left in place with follow-up with radiology on 7/2.  Today: reports feels well no fevers or chills. Has known chronic back pain. Review of Systems: Review of Systems  All other systems reviewed and are negative.   Past  Medical History:  Diagnosis Date   Acrophobia    Alcoholic cirrhosis (HCC) 10/04/2015   Anemia    Arthritis    foot by big toe   BPH associated with nocturia    Cataract    removed both eyes   Cholecystitis 05/2022   tx Abx   Chronic cough    PMH of   CLL (chronic lymphocytic leukemia) (HCC)    COVID-19 12/2018   Diabetes mellitus without complication (HCC)    Diverticulosis 07/03/2010   Colonoscopy.    Duodenal ulcer 2017   Fallen arches    Bilateral   GERD (gastroesophageal reflux disease)    Gout    Granuloma annulare    Hx of adenomatous colonic polyps multiple   Hydrocele 2011   Large septated right hydrocele   Liver cyst    Liver lesion    Nonspecific elevation of levels of transaminase or lactic acid dehydrogenase (LDH)    Obesity    Peripheral neuropathy    Plantar fasciitis    PMH of   Portal hypertension (HCC) 2017   Prostate cancer (HCC)    Right shoulder pain 11/2017   Sleep apnea    no cpap, patient denies   Thiamine  deficiency    Wears reading eyeglasses    WPW (Wolff-Parkinson-White syndrome) 02/05/2019    Social History   Tobacco Use   Smoking status: Former    Current packs/day: 0.00    Average packs/day: 2.0 packs/day for 6.0 years (12.0 ttl pk-yrs)    Types: Cigarettes    Start date: 02/03/1965    Quit date: 02/04/1971    Years since quitting: 52.6    Passive exposure: Never   Smokeless tobacco: Never   Tobacco comments:    smoked age [age], up to 2 ppd  Vaping Use   Vaping status: Never Used  Substance Use Topics   Alcohol use: Not Currently    Comment: No alochol since Christmas Day 2020   Drug use: No    Family History  Problem Relation Age of Onset   Lung cancer Mother        smoker   Leukemia Father        Acute myelocytic   Diabetes Neg Hx    Stroke Neg Hx    Heart disease Neg Hx    Colon cancer Neg Hx    Colon polyps Neg Hx    Esophageal cancer Neg Hx    Rectal cancer Neg Hx    Stomach cancer Neg Hx     Allergies   Allergen Reactions   Rifaximin  Nausea And Vomiting    Required EMS visit, although patient did not go to hospital   Beta Adrenergic Blockers     Likely WPW.  Use with caution   Calcium  Channel Blockers     Likely WPW.  Use with caution.    Health Maintenance  Topic Date Due   Zoster Vaccines- Shingrix  (1 of 2) Never done   Hepatitis B Vaccines (2 of 3 - Risk 3-dose series) 11/30/2015   COVID-19 Vaccine (3 - Moderna risk series) 01/10/2020   OPHTHALMOLOGY EXAM  05/17/2022   INFLUENZA VACCINE  09/04/2023   HEMOGLOBIN A1C  01/12/2024   Diabetic kidney evaluation -  Urine ACR  04/27/2024   FOOT EXAM  04/27/2024   Diabetic kidney evaluation - eGFR measurement  08/03/2024   Medicare Annual Wellness (AWV)  08/23/2024   DTaP/Tdap/Td (4 - Td or Tdap) 06/27/2029   Pneumococcal Vaccine: 50+ Years  Completed   Hepatitis C Screening  Completed   HPV VACCINES  Aged Out   Meningococcal B Vaccine  Aged Out   Colonoscopy  Discontinued    Objective:  There were no vitals filed for this visit. There is no height or weight on file to calculate BMI.  Physical Exam Constitutional:      General: He is not in acute distress.    Appearance: He is normal weight. He is not toxic-appearing.  HENT:     Head: Normocephalic and atraumatic.     Right Ear: External ear normal.     Left Ear: External ear normal.     Nose: No congestion or rhinorrhea.     Mouth/Throat:     Mouth: Mucous membranes are moist.     Pharynx: Oropharynx is clear.  Eyes:     Extraocular Movements: Extraocular movements intact.     Conjunctiva/sclera: Conjunctivae normal.     Pupils: Pupils are equal, round, and reactive to light.  Cardiovascular:     Rate and Rhythm: Normal rate and regular rhythm.     Heart sounds: No murmur heard.    No friction rub. No gallop.  Pulmonary:     Effort: Pulmonary effort is normal.     Breath sounds: Normal breath sounds.  Abdominal:     General: Abdomen is flat. Bowel sounds are  normal.     Palpations: Abdomen is soft.  Musculoskeletal:        General: No swelling.     Cervical back: Normal range of motion and neck supple.  Skin:    General: Skin is warm and dry.  Neurological:     General: No focal deficit present.     Mental Status: He is oriented to person, place, and time.  Psychiatric:        Mood and Affect: Mood normal.    Physical Exam   Lab Results Lab Results  Component Value Date   WBC 5.9 08/04/2023   HGB 11.0 (L) 08/04/2023   HCT 32.8 (L) 08/04/2023   MCV 89.1 08/04/2023   PLT 106 (L) 08/04/2023    Lab Results  Component Value Date   CREATININE 0.95 08/04/2023   BUN 13 08/04/2023   NA 140 08/04/2023   K 4.6 08/04/2023   CL 104 08/04/2023   CO2 30 08/04/2023    Lab Results  Component Value Date   ALT 10 08/04/2023   AST 18 08/04/2023   ALKPHOS 108 08/04/2023   BILITOT 0.9 08/04/2023    Lab Results  Component Value Date   CHOL 178 04/28/2023   HDL 69.80 04/28/2023   LDLCALC 90 04/28/2023   TRIG 90.0 04/28/2023   CHOLHDL 3 04/28/2023   Lab Results  Component Value Date   LABRPR NON REAC 07/16/2012   No results found for: HIV1RNAQUANT, HIV1RNAVL, CD4TABS   Problem List Items Addressed This Visit   None  Results   Assessment/Plan #Recurrent abscess status post drain placement on 6/9 - Initial MRI showed 14.6 cm hematoma versus thrombus versus abscess - Underwent IR drain placement of retroperitoneal abscess which drained 60 cc of purulent fluid growing Klebsiella pneumoniae, Citrobacter, Bacteroides ovatus beta-lactamase positive.  He was treated meropenem  inpatient then transition to ertapenem  to  complete about 3 weeks antibiotics from drain placement as EOT 7/2 - he was seen by interventional radiology on 6/27 which showed CT without residual fluid.  He was still draining about 20 mL of fluid as such drain was left in place with follow-up on 7/2-> IR removed drian on 7/2  -Plan -abs stopped on 7/2 Labs off  of abx today(cbc-already obtained, cmp) F/u with ID PRN  #Medication management -PICC out and abx stopped   Loney Stank, MD [facility] 08/31/2023, 9:48 AM   I have personally spent 43 minutes involved in face-to-face and non-face-to-face activities for this patient on the day of the visit. Professional time spent includes the following activities: Preparing to see the patient (review of tests), Obtaining and/or reviewing separately obtained history (admission/discharge record), Performing a medically appropriate examination and/or evaluation , Ordering medications/tests/procedures, referring and communicating with other health care professionals, Documenting clinical information in the EMR, Independently interpreting results (not separately reported), Communicating results to the patient/family/caregiver, Counseling and educating the patient/family/caregiver and Care coordination (not separately reported).

## 2023-08-31 NOTE — Progress Notes (Signed)
 Subjective:  Patient ID: Patrick Kidd, male    DOB: Oct 09, 1945  Age: 78 y.o. MRN: [id number]  CC: Anemia, Diabetes, and Back Pain   HPI Patrick Kidd presents for f/up ----  Discussed the use of AI scribe software for clinical note transcription with the patient, who gave verbal consent to proceed.  History of Present Illness Patrick Kidd is a 78 year old male who presents for follow-up regarding anemia and rib pain.  He no longer experiences pain from a previous abscess and has no fevers, chills, night sweats, or symptoms of a stomach ulcer. He believes the anemia might be related to a gallbladder surgery where something might have been nicked, leading to the abscess. He denies losing blood and does not feel anemic, with no numbness, weakness, dizziness, lightheadedness, or fatigue, except for numbness in his feet, which has been present for quite a while.  In May, he fell and bruised three ribs, with one rib still sore. An X-ray since the fall showed no fractures. He is not taking any medication for the rib pain, which is intermittent and occasionally causes discomfort when he turns over.  He reports frequent urination, stating he has a 'ten second rule' regardless of location. No trouble urinating, weak urine stream, or dribbling.  His last A1c was 7.7% as of June 9th. His last eye exam was in March, as his previous doctor was retiring in June.    Outpatient Medications Prior to Visit  Medication Sig Dispense Refill   gabapentin  (NEURONTIN ) 300 MG capsule Take 1 capsule (300 mg total) by mouth 3 (three) times daily. 270 capsule 0   insulin  glargine, 2 Unit Dial , (TOUJEO  MAX SOLOSTAR) 300 UNIT/ML Solostar Pen Inject 12 Units into the skin daily. Via SANOFI pt assistance 12 mL 0   Insulin  Pen Needle (BD PEN NEEDLE MINI ULTRAFINE) 31G X 5 MM MISC INJECT 1 ACT INTO THE SKIN DAILY. USE TO ADMINISTER INSULIN . DX E11.9   Strength: 31G X 5 MM 100 each 1   metFORMIN   (GLUCOPHAGE ) 500 MG tablet TAKE 1 TABLET BY MOUTH TWICE A DAY WITH FOOD 180 tablet 1   Multiple Vitamins-Minerals (CENTRUM SILVER 50+MEN) TABS Take 1 tablet by mouth daily.     pantoprazole  (PROTONIX ) 40 MG tablet TAKE 1 TABLET BY MOUTH 2 TIMES DAILY 30 MINUTES BEFORE MEALS (BREAKFAST AND SUPPER) 180 tablet 0   No facility-administered medications prior to visit.    ROS Review of Systems  Objective:  BP 130/68 (BP Location: Left Arm, Patient Position: Sitting, Cuff Size: Normal)   Pulse 91   Temp 98.9 F (37.2 C) (Oral)   Ht 6' 2 (1.88 m)   Wt 208 lb (94.3 kg)   SpO2 98%   BMI 26.71 kg/m   BP Readings from Last 3 Encounters:  08/31/23 (!) 146/78  08/31/23 130/68  08/25/23 (!) 161/78    Wt Readings from Last 3 Encounters:  08/31/23 208 lb (94.3 kg)  08/31/23 208 lb (94.3 kg)  08/25/23 210 lb (95.3 kg)    Physical Exam  Lab Results  Component Value Date   WBC 6.6 08/31/2023   HGB 13.9 08/31/2023   HCT 41.2 08/31/2023   PLT 121.0 (L) 08/31/2023   GLUCOSE 120 (H) 08/04/2023   CHOL 178 04/28/2023   TRIG 90.0 04/28/2023   HDL 69.80 04/28/2023   LDLCALC 90 04/28/2023   ALT 10 08/04/2023   AST 18 08/04/2023   NA 140 08/04/2023   K 4.6  08/04/2023   CL 104 08/04/2023   CREATININE 0.95 08/04/2023   BUN 13 08/04/2023   CO2 30 08/04/2023   TSH 1.60 04/28/2023   PSA 0.2 05/15/2021   INR 1.4 (H) 07/12/2023   HGBA1C 5.4 08/31/2023   MICROALBUR 10.4 (H) 04/28/2023    IR Radiologist Eval & Mgmt Result Date: 08/05/2023 EXAM: ESTABLISHED PATIENT OFFICE VISIT CHIEF COMPLAINT: See Epic note. HISTORY OF PRESENT ILLNESS: See Epic note. REVIEW OF SYSTEMS: See Epic note. PHYSICAL EXAMINATION: See Epic note. ASSESSMENT AND PLAN: See Epic note. Ester Sides, MD [facility] Electronically Signed   By: Ester Sides M.D.   On: 08/05/2023 13:22    Assessment & Plan:  Type 2 diabetes mellitus with complication, with  long-term current use of insulin  (HCC) -     Hemoglobin A1c; Future  Thiamine  deficiency -     Vitamin B-1; Take 1 tablet (50 mg total) by mouth daily.  Dispense: 90 tablet; Refill: 1  Deficiency anemia -     Zinc ; Future -     Lactate dehydrogenase; Future -     CBC with Differential/Platelet; Future -     Reticulocytes; Future     Follow-up: Return in about 4 months (around 01/01/2024).  Debby Molt, MD

## 2023-08-31 NOTE — Patient Instructions (Signed)

## 2023-09-03 ENCOUNTER — Ambulatory Visit: Payer: Medicare Other | Admitting: Neurology

## 2023-09-03 DIAGNOSIS — N401 Enlarged prostate with lower urinary tract symptoms: Secondary | ICD-10-CM | POA: Diagnosis not present

## 2023-09-03 DIAGNOSIS — N3946 Mixed incontinence: Secondary | ICD-10-CM | POA: Diagnosis not present

## 2023-09-03 LAB — LACTATE DEHYDROGENASE: LDH: 133 U/L (ref 120–250)

## 2023-09-03 LAB — RETICULOCYTES
ABS Retic: 79560 {cells}/uL (ref 25000–90000)
Retic Ct Pct: 1.8 %

## 2023-09-03 LAB — ZINC: Zinc: 56 ug/dL — ABNORMAL LOW (ref 60–130)

## 2023-09-04 ENCOUNTER — Other Ambulatory Visit: Payer: Self-pay | Admitting: Internal Medicine

## 2023-09-04 DIAGNOSIS — E60 Dietary zinc deficiency: Secondary | ICD-10-CM | POA: Insufficient documentation

## 2023-09-04 MED ORDER — ZINC GLUCONATE 50 MG PO TABS: 50.0000 mg | ORAL_TABLET | Freq: Every day | ORAL | 0 refills | Status: AC

## 2023-09-09 ENCOUNTER — Telehealth: Payer: Self-pay | Admitting: Internal Medicine

## 2023-09-09 NOTE — Telephone Encounter (Signed)
 Copied from CRM #[id number]. Topic: General - Call Back - No Documentation >> Sep 09, 2023  2:37 PM Rea BROCKS wrote: Reason for CRM: Caller/Agency: [facility]  Callback Number: [phone] Any new concerns about the patient? Today was discharge from home health services. Patient is doing well.

## 2023-09-10 ENCOUNTER — Encounter: Payer: Self-pay | Admitting: Internal Medicine

## 2023-09-10 NOTE — Telephone Encounter (Signed)
 FYI

## 2023-09-11 ENCOUNTER — Other Ambulatory Visit: Payer: Self-pay | Admitting: Internal Medicine

## 2023-09-14 ENCOUNTER — Ambulatory Visit: Payer: Self-pay

## 2023-09-14 NOTE — Telephone Encounter (Signed)
 FYI Only or Action Required?: FYI only for provider.  Patient was last seen in primary care on 08/31/2023 by Joshua Debby CROME, MD.  Called Nurse Triage reporting Skin Problem and Skin injury (Multiple superficial cuts from a shower door breaking. ).  Symptoms began today.  Interventions attempted: Rest, hydration, or home remedies.  Symptoms are: unchanged.  Triage Disposition: See PCP When Office is Open (Within 3 Days)  Patient/caregiver understands and will follow disposition?: Yes  Copied from CRM #[id number]. Topic: Clinical - Red Word Triage >> Sep 14, 2023  2:13 PM Thersia BROCKS wrote: Red Word that prompted transfer to Nurse Triage: Patient wife [name] called in regarding patient, stated he was taking a shower and the shower door broke, she he has some scraps and scars and cuts, she wanted to know about tetanus shot and also he has diabetes so wanted to know what is needed to do for the wounds Reason for Disposition  [1] Minor skin injury (e,g,. minor cut, scratch, scrape) on foot AND [2] diabetes (diabetes mellitus)  Answer Assessment - Initial Assessment Questions 1. APPEARANCE What does the injury look like?      Patient was finished with his shower and shower door exploded inward and outward 2. ONSET: How long ago did the injury occur?      Prior to phone call 3. LOCATION: Where is the injury located?      Bilateral legs, tops of feet and left arm 4. SIZE: How large is the cut?      Cuts are very small-one is bigger at 1 in on the back of red leg 5. BLEEDING: Is it bleeding now? If Yes, ask: Is it difficult to stop?      No bleeding currently 6. PAIN: Is there any pain? If Yes, ask: How bad is the pain? (Scale 0-10; or none, mild, moderate, severe)     no 7. MECHANISM: Tell me how it happened.      Patient states shower door exploded after showering.  8. TETANUS: When was your last tetanus booster?     06/26/2019  Protocols used: Skin Injury-A-AH

## 2023-09-15 ENCOUNTER — Encounter: Payer: Self-pay | Admitting: Family Medicine

## 2023-09-15 ENCOUNTER — Ambulatory Visit (INDEPENDENT_AMBULATORY_CARE_PROVIDER_SITE_OTHER): Admitting: Family Medicine

## 2023-09-15 VITALS — BP 128/80 | HR 95 | Temp 98.6°F | Ht 74.0 in | Wt 215.0 lb

## 2023-09-15 DIAGNOSIS — S81801A Unspecified open wound, right lower leg, initial encounter: Secondary | ICD-10-CM

## 2023-09-15 MED ORDER — CEPHALEXIN 500 MG PO CAPS: 500.0000 mg | ORAL_CAPSULE | Freq: Two times a day (BID) | ORAL | 0 refills | Status: DC

## 2023-09-15 NOTE — Progress Notes (Signed)
 Acute Office Visit  Subjective:     Patient ID: Patrick Kidd, male    DOB: Sep 05, 1945, 78 y.o.   MRN: [id number]  Chief Complaint  Patient presents with   Acute Visit    HPI  Discussed the use of AI scribe software for clinical note transcription with the patient, who gave verbal consent to proceed.  History of Present Illness Patrick Kidd is a 78 year old male who presents with multiple lacerations from a glass shower door accident.  Lacerations and soft tissue injury - Sustained multiple lacerations after a glass shower door shattered around him yesterday morning while he was in the shower - Lacerations present on hand and feet - Most significant laceration on hand, continues to bleed when pressure is applied - Pain in hand, worsened by pressure - Scabs and superficial wounds present on feet  Immunization status - Last tetanus vaccination in 2021     ROS Per HPI      Objective:    BP 128/80 (BP Location: Left Arm, Patient Position: Sitting, Cuff Size: Normal)   Pulse 95   Temp 98.6 F (37 C) (Temporal)   Ht 6' 2 (1.88 m)   Wt 215 lb (97.5 kg)   SpO2 98%   BMI 27.60 kg/m    Physical Exam Vitals and nursing note reviewed.  Constitutional:      General: He is not in acute distress.    Appearance: Normal appearance.  HENT:     Head: Normocephalic and atraumatic.     Right Ear: External ear normal.     Left Ear: External ear normal.     Nose: Nose normal.     Mouth/Throat:     Mouth: Mucous membranes are moist.  Eyes:     Extraocular Movements: Extraocular movements intact.  Cardiovascular:     Rate and Rhythm: Normal rate and regular rhythm.     Pulses: Normal pulses.  Pulmonary:     Effort: Pulmonary effort is normal.  Musculoskeletal:        General: Normal range of motion.     Cervical back: Normal range of motion.     Right lower leg: No edema.     Left lower leg: No edema.  Lymphadenopathy:     Cervical: No cervical  adenopathy.  Skin:    General: Skin is warm and dry.     Findings: Abrasion and laceration present.         Comments: Abrasions of varying sizes to hands, lower extremities.  Lacerations to posterior right lower leg (2cm length, superficial), and to the dorsal surface of the left foot (0.5cm length, superficial) Saline to cleanse wounds, steri strips applied to lacerations  Neurological:     General: No focal deficit present.     Mental Status: He is alert and oriented to person, place, and time.  Psychiatric:        Mood and Affect: Mood normal.        Behavior: Behavior normal.     No results found for any visits on 09/15/23.      Assessment & Plan:   Assessment and Plan Assessment & Plan Multiple superficial open wounds of lower extremities and hand Wounds from glass shower door incident. Not deep, but risk of infection present. - Apply Steri-Strips to maintain closure. - Advise keeping wounds dry for 24 hours. - Allow Steri-Strips to fall off naturally after 4-5 days. - Prescribe Keflex  500 mg twice a day for 5  days if infection signs develop. - Advise against swimming or soaking wounds until healed. - Instruct to wear socks to protect foot wounds. - Send prescription to [facility].     No orders of the defined types were placed in this encounter.    Meds ordered this encounter  Medications   cephALEXin  (KEFLEX ) 500 MG capsule    Sig: Take 1 capsule (500 mg total) by mouth 2 (two) times daily for 5 days.    Dispense:  10 capsule    Refill:  0    Return if symptoms worsen or fail to improve.  Patrick LITTIE Ku, FNP

## 2023-09-16 NOTE — Patient Instructions (Signed)
 We have applied Steri-Strips to the laceration to your right lower leg and to the top of your left foot.  These will fall off once the wound underneath heals.  Please keep the area dry for the next 24 hours, then you may shower normally.  I have sent in Keflex  for you to take twice a day for 5 days.  Only take this if signs and symptoms of infection develop such as increased redness, swelling, heat to the area, discharge from the wound, tenderness, chills, fever.  Follow-up with me for new or worsening symptoms.

## 2023-09-17 ENCOUNTER — Encounter: Payer: Self-pay | Admitting: Internal Medicine

## 2023-09-17 ENCOUNTER — Ambulatory Visit (AMBULATORY_SURGERY_CENTER): Admitting: Internal Medicine

## 2023-09-17 VITALS — BP 142/69 | HR 81 | Temp 98.0°F | Resp 15 | Ht 74.0 in | Wt 207.0 lb

## 2023-09-17 DIAGNOSIS — E669 Obesity, unspecified: Secondary | ICD-10-CM | POA: Diagnosis not present

## 2023-09-17 DIAGNOSIS — G4730 Sleep apnea, unspecified: Secondary | ICD-10-CM | POA: Diagnosis not present

## 2023-09-17 DIAGNOSIS — K7030 Alcoholic cirrhosis of liver without ascites: Secondary | ICD-10-CM | POA: Diagnosis not present

## 2023-09-17 DIAGNOSIS — K3189 Other diseases of stomach and duodenum: Secondary | ICD-10-CM

## 2023-09-17 MED ORDER — SODIUM CHLORIDE 0.9 % IV SOLN: 500.0000 mL | INTRAVENOUS | Status: DC

## 2023-09-17 NOTE — Progress Notes (Signed)
 [facility] History and Physical   Primary Care Physician:  Joshua Debby CROME, MD   Reason for Procedure:    Encounter Diagnosis  Name Primary?   Alcoholic cirrhosis, unspecified whether ascites present (HCC) Yes     Plan:    EGD - screen fpr varices     HPI: Patrick Kidd is a 78 y.o. male here for variceal screening exam   Past Medical History:  Diagnosis Date   Acrophobia    Alcoholic cirrhosis (HCC) 10/04/2015   Anemia    Arthritis    foot by big toe   BPH associated with nocturia    Cataract    removed both eyes   Cholecystitis 05/2022   tx Abx   Chronic cough    PMH of   CLL (chronic lymphocytic leukemia) (HCC)    COVID-19 12/2018   Diabetes mellitus without complication (HCC)    Diverticulosis 07/03/2010   Colonoscopy.    Duodenal ulcer 2017   Fallen arches    Bilateral   GERD (gastroesophageal reflux disease)    Gout    Granuloma annulare    Hx of adenomatous colonic polyps multiple   Hydrocele 2011   Large septated right hydrocele   Liver cyst    Liver lesion    Nonspecific elevation of levels of transaminase or lactic acid dehydrogenase (LDH)    Obesity    Peripheral neuropathy    Plantar fasciitis    PMH of   Portal hypertension (HCC) 2017   Prostate cancer (HCC)    Right shoulder pain 11/2017   Sleep apnea    no cpap, patient denies   Thiamine  deficiency    Wears reading eyeglasses    WPW (Wolff-Parkinson-White syndrome) 02/05/2019    Past Surgical History:  Procedure Laterality Date   BACK SURGERY  04/28/2022   CATARACT EXTRACTION, BILATERAL  12/2011   Dr Roz   CHOLECYSTECTOMY, ROBOT-ASSISTED, LAPAROSCOPIC  03/2023   [facility]   COLONOSCOPY  2017   COLONOSCOPY W/ POLYPECTOMY  07/03/2010   2 adenomas, diverticulosis on right. Dr Avram   CYSTOSCOPY N/A 03/15/2018   Procedure: PHYLLIS SIDE;  Surgeon: Carolee Sherwood JONETTA DOUGLAS, MD;  Location: [facility];  Service: Urology;  Laterality: N/A;  NO  SEEDS FOUND IN BLADDER   ESOPHAGOGASTRODUODENOSCOPY (EGD) WITH PROPOFOL  N/A 01/08/2016   Procedure: ESOPHAGOGASTRODUODENOSCOPY (EGD) WITH PROPOFOL ;  Surgeon: Gwendlyn ONEIDA Buddy, MD;  Location: [facility];  Service: Endoscopy;  Laterality: N/A;   FLEXIBLE SIGMOIDOSCOPY  2000   IR EXCHANGE BILIARY DRAIN  02/17/2023   IR PATIENT EVAL TECH 0-60 MINS  12/26/2022   IR PERC CHOLECYSTOSTOMY  12/05/2022   IR PERC CHOLECYSTOSTOMY  12/09/2022   IR RADIOLOGIST EVAL & MGMT  01/16/2023   IR RADIOLOGIST EVAL & MGMT  07/31/2023   IR RADIOLOGIST EVAL & MGMT  08/05/2023   RADIOACTIVE SEED IMPLANT N/A 03/15/2018   Procedure: RADIOACTIVE SEED IMPLANT/BRACHYTHERAPY IMPLANT;  Surgeon: Carolee Sherwood JONETTA DOUGLAS, MD;  Location: [facility];  Service: Urology;  Laterality: N/A;   61 SEEDS IMPLANTED   RADIOLOGY WITH ANESTHESIA N/A 12/09/2022   Procedure: IR WITH ANESTHESIA;  Surgeon: Radiologist, Medication, MD;  Location: [facility];  Service: Radiology;  Laterality: N/A;   SIGMOIDOSCOPY     SPACE OAR INSTILLATION N/A 03/15/2018   Procedure: SPACE OAR INSTILLATION;  Surgeon: Carolee Sherwood JONETTA DOUGLAS, MD;  Location: [facility];  Service: Urology;  Laterality: N/A;   WISDOM TOOTH EXTRACTION  Current Outpatient Medications  Medication Sig Dispense Refill   gabapentin  (NEURONTIN ) 300 MG capsule Take 1 capsule (300 mg total) by mouth 3 (three) times daily. 270 capsule 0   Multiple Vitamins-Minerals (CENTRUM SILVER 50+MEN) TABS Take 1 tablet by mouth daily.     pantoprazole  (PROTONIX ) 40 MG tablet TAKE 1 TABLET BY MOUTH 2 TIMES DAILY 30 MINUTES BEFORE MEALS (BREAKFAST AND SUPPER) 180 tablet 0   thiamine  (VITAMIN B-1) 50 MG tablet Take 1 tablet (50 mg total) by mouth daily. 90 tablet 1   zinc  gluconate 50 MG tablet Take 1 tablet (50 mg total) by mouth daily. 90 tablet 0   Insulin  Pen Needle (BD PEN NEEDLE MINI ULTRAFINE) 31G X 5 MM MISC INJECT 1 ACT INTO THE SKIN DAILY. USE TO ADMINISTER INSULIN . DX  E11.9   Strength: 31G X 5 MM 100 each 1   Current Facility-Administered Medications  Medication Dose Route Frequency Provider Last Rate Last Admin   0.9 %  sodium chloride  infusion  500 mL Intravenous Continuous Avram Lupita BRAVO, MD        Allergies as of 09/17/2023 - Review Complete 09/17/2023  Allergen Reaction Noted   Rifaximin  Nausea And Vomiting 04/19/2020   Beta adrenergic blockers Other (See Comments) 02/05/2019   Calcium  channel blockers Other (See Comments) 02/05/2019    Family History  Problem Relation Age of Onset   Lung cancer Mother        smoker   Leukemia Father        Acute myelocytic   Diabetes Neg Hx    Stroke Neg Hx    Heart disease Neg Hx    Colon cancer Neg Hx    Colon polyps Neg Hx    Esophageal cancer Neg Hx    Rectal cancer Neg Hx    Stomach cancer Neg Hx     Social History   Socioeconomic History   Marital status: Married    Spouse name: [name]   Number of children: 2   Years of education: Not on file   Highest education level: Some college, no degree  Occupational History   Occupation: Merchandiser, retail time  Tobacco Use   Smoking status: Former    Current packs/day: 0.00    Average packs/day: 2.0 packs/day for 6.0 years (12.0 ttl pk-yrs)    Types: Cigarettes    Start date: 02/03/1965    Quit date: 02/04/1971    Years since quitting: 52.6    Passive exposure: Never   Smokeless tobacco: Never   Tobacco comments:    smoked age [age], up to 2 ppd  Vaping Use   Vaping status: Never Used  Substance and Sexual Activity   Alcohol use: Not Currently    Comment: No alochol since Christmas Day 2020   Drug use: No   Sexual activity: Yes    Partners: Female  Other Topics Concern   Not on file  Social History Narrative   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 He lives  with his wife in a one level home. He swims most days. 2025   Social Drivers of Corporate investment banker Strain: Low Risk  (08/24/2023)   Overall Financial Resource Strain (CARDIA)    Difficulty of Paying Living Expenses: Not hard at all  Food Insecurity: No Food Insecurity (  08/24/2023)   Hunger Vital Sign    Worried About Running Out of Food in the Last Year: Never true    Ran Out of Food in the Last Year: Never true  Transportation Needs: No Transportation Needs (08/24/2023)   PRAPARE - Administrator, Civil Service (Medical): No    Lack of Transportation (Non-Medical): No  Physical Activity: Insufficiently Active (08/24/2023)   Exercise Vital Sign    Days of Exercise per Week: 1 day    Minutes of Exercise per Session: 40 min  Stress: No Stress Concern Present (08/24/2023)   [employer] of Occupational Health - Occupational Stress Questionnaire    Feeling of Stress: Not at all  Social Connections: Moderately Isolated (08/24/2023)   Social Connection and Isolation Panel    Frequency of Communication with Friends and Family: More than three times a week    Frequency of Social Gatherings with Friends and Family: Three times a week    Attends Religious Services: Never    Active Member of Clubs or Organizations: No    Attends Banker Meetings: Never    Marital Status: Married  Catering manager Violence: Not At Risk (08/24/2023)   Humiliation, Afraid, Rape, and Kick questionnaire    Fear of Current or Ex-Partner: No    Emotionally Abused: No    Physically Abused: No    Sexually Abused: No    Review of Systems: Positive for recent leg lacerations All other review of systems negative except as mentioned in the HPI.  Physical Exam: Vital signs BP 135/78   Pulse 79   Temp 98 F (36.7 C)   Ht 6' 2 (1.88 m)   Wt 207 lb (93.9 kg)   SpO2 98%   BMI 26.58 kg/m   General:   Alert,  Well-developed, well-nourished, pleasant and cooperative in  NAD Lungs:  Clear throughout to auscultation.   Heart:  Regular rate and rhythm; no murmurs, clicks, rubs,  or gallops. Abdomen:  Soft, nontender and nondistended. Normal bowel sounds.   Neuro/Psych:  Alert and cooperative. Normal mood and affect. A and O x 3   @Tyaira Heward  CHARLENA Commander, MD, [facility] [phone] (pager) 09/17/2023 1:33 PM@

## 2023-09-17 NOTE — Patient Instructions (Addendum)
 I did not see any varices (dilated veins) - this is good news.  Would plan on an office follow-up in 6 months after labs drawn.  Repeat EGD in a year.  I appreciate the opportunity to care for you. Lupita CHARLENA Commander, MD, FACG   YOU HAD AN ENDOSCOPIC PROCEDURE TODAY AT THE [facility]:   Refer to the procedure report that was given to you for any specific questions about what was found during the examination.  If the procedure report does not answer your questions, please call your gastroenterologist to clarify.  If you requested that your care partner not be given the details of your procedure findings, then the procedure report has been included in a sealed envelope for you to review at your convenience later.  YOU SHOULD EXPECT: Some feelings of bloating in the abdomen. Passage of more gas than usual.  Walking can help get rid of the air that was put into your GI tract during the procedure and reduce the bloating. If you had a lower endoscopy (such as a colonoscopy or flexible sigmoidoscopy) you may notice spotting of blood in your stool or on the toilet paper. If you underwent a bowel prep for your procedure, you may not have a normal bowel movement for a few days.  Please Note:  You might notice some irritation and congestion in your nose or some drainage.  This is from the oxygen used during your procedure.  There is no need for concern and it should clear up in a day or so.  SYMPTOMS TO REPORT IMMEDIATELY:  Following upper endoscopy (EGD)  Vomiting of blood or coffee ground material  New chest pain or pain under the shoulder blades  Painful or persistently difficult swallowing  New shortness of breath  Fever of 100F or higher  Black, tarry-looking stools  For urgent or emergent issues, a gastroenterologist can be reached at any hour by calling [phone]. Do not use MyChart messaging for urgent concerns.    DIET:  We do recommend a small meal at first, but then you  may proceed to your regular diet.  Drink plenty of fluids but you should avoid alcoholic beverages for 24 hours.  ACTIVITY:  You should plan to take it easy for the rest of today and you should NOT DRIVE or use heavy machinery until tomorrow (because of the sedation medicines used during the test).    FOLLOW UP: Our staff will call the number listed on your records the next business day following your procedure.  We will call around 7:15- 8:00 am to check on you and address any questions or concerns that you may have regarding the information given to you following your procedure. If we do not reach you, we will leave a message.     If any biopsies were taken you will be contacted by phone or by letter within the next 1-3 weeks.  Please call us  at [phone] if you have not heard about the biopsies in 3 weeks.    SIGNATURES/CONFIDENTIALITY: You and/or your care partner have signed paperwork which will be entered into your electronic medical record.  These signatures attest to the fact that that the information above on your After Visit Summary has been reviewed and is understood.  Full responsibility of the confidentiality of this discharge information lies with you and/or your care-partner.

## 2023-09-17 NOTE — Op Note (Signed)
 [facility] Patient Name: Patrick Kidd Procedure Date: 09/17/2023 12:42 PM MRN: [id number] Endoscopist: Lupita FORBES Commander , MD, [id number] Age: 78 Referring MD:  Date of Birth: 1945-10-30 Gender: Male Account #: [id number] Procedure:                Upper GI endoscopy Indications:              Cirrhosis rule out esophageal varices Medicines:                Monitored Anesthesia Care Procedure:                Pre-Anesthesia Assessment:                           - Prior to the procedure, a History and Physical                            was performed, and patient medications and                            allergies were reviewed. The patient's tolerance of                            previous anesthesia was also reviewed. The risks                            and benefits of the procedure and the sedation                            options and risks were discussed with the patient.                            All questions were answered, and informed consent                            was obtained. Prior Anticoagulants: The patient has                            taken no anticoagulant or antiplatelet agents. ASA                            Grade Assessment: III - A patient with severe                            systemic disease. After reviewing the risks and                            benefits, the patient was deemed in satisfactory                            condition to undergo the procedure.                           After obtaining informed consent, the endoscope was  passed under direct vision. Throughout the                            procedure, the patient's blood pressure, pulse, and                            oxygen saturations were monitored continuously. The                            GIF [id number] #[id number] was introduced through the                            mouth, and advanced to the second part of duodenum.                            The upper  GI endoscopy was accomplished without                            difficulty. The patient tolerated the procedure                            well. Scope In: Scope Out: Findings:                 The examined esophagus was normal.                           Diffuse mild mucosal changes characterized by                            granularity/snakeskin changes were found in the                            stomach.                           The examined duodenum was normal.                           The cardia and gastric fundus were normal on                            retroflexion. Complications:            No immediate complications. Estimated Blood Loss:     Estimated blood loss: none. Impression:               - Normal esophagus.                           - Granular/snakeskin mucosa in the stomach. Could                            be very mild potal gastropathy                           - Normal examined duodenum.                           -  No specimens collected. Recommendation:           - Patient has a contact number available for                            emergencies. The signs and symptoms of potential                            delayed complications were discussed with the                            patient. Return to normal activities tomorrow.                            Written discharge instructions were provided to the                            patient.                           - Resume previous diet.                           - Continue present medications.                           - Repeat upper endoscopy in 1 year for screening                            purposes.                           - Office will place reminder to do CBC, CMET, INR                            and AFP in 01/2024 Lupita FORBES Commander, MD 09/17/2023 1:55:04 PM This report has been signed electronically.

## 2023-09-17 NOTE — Progress Notes (Signed)
 Recall put in for 6 months for follow up office visit. Schedule is not out that far.

## 2023-09-18 ENCOUNTER — Telehealth: Payer: Self-pay

## 2023-09-18 NOTE — Telephone Encounter (Signed)
 Attempted to reach patient for follow up phone call. No answer, left voicemail to contact Dr. Jadene Maxwell office with any questions or concerns.

## 2023-09-29 DIAGNOSIS — M47816 Spondylosis without myelopathy or radiculopathy, lumbar region: Secondary | ICD-10-CM | POA: Diagnosis not present

## 2023-10-08 DIAGNOSIS — N3946 Mixed incontinence: Secondary | ICD-10-CM | POA: Diagnosis not present

## 2023-10-08 DIAGNOSIS — N401 Enlarged prostate with lower urinary tract symptoms: Secondary | ICD-10-CM | POA: Diagnosis not present

## 2023-10-09 ENCOUNTER — Other Ambulatory Visit: Payer: Self-pay | Admitting: Urology

## 2023-10-27 NOTE — Progress Notes (Signed)
 COVID Vaccine received:  []  No [x]  Yes Date of any COVID positive Test in last 90 days:  PCP - Debby Molt, MD Cardiologist - Dorn Lesches, MD   Chest x-ray - 07-09-2023  2v EKG - 12-06-2022  Stress Test -  ECHO - 12-06-2022 Epic Cardiac Cath -  CT Coronary Calcium  score:   Bowel Prep - []  No  []   Yes ______  Pacemaker / ICD device []  No []  Yes   Spinal Cord Stimulator:[]  No []  Yes       History of Sleep Apnea? []  No []  Yes   CPAP used?- []  No []  Yes    Patient has: []  NO Hx DM   []  Pre-DM   []  DM1  []   DM2 Does the patient monitor blood sugar?   []  N/A   []  No []  Yes  Last A1c was:        on      Does patient have a [employer] or Dexcom? []  No []  Yes   Fasting Blood Sugar Ranges-  Checks Blood Sugar _____ times a day  Blood Thinner / Instructions: Aspirin Instructions:  Dental hx: []  Dentures:  []  N/A      []  Bridge or Partial:                   []  Loose or Damaged teeth:   Comments:   Activity level: Able to walk up 2 flights of stairs without becoming significantly short of breath or having chest pain?  []  No   []    Yes  Patient can perform ADLs without assistance. []  No   []   Yes  Anesthesia review:   Patient denies any S&S of respiratory illness or Covid - no shortness of breath, fever, cough or chest pain at PAT appointment.  Patient verbalized understanding and agreement to the Pre-Surgical Instructions that were given to them at this PAT appointment. Patient was also educated of the need to review these PAT instructions again prior to his/her surgery.I reviewed the appropriate phone numbers to call if they have any and questions or concerns.

## 2023-10-28 ENCOUNTER — Encounter (HOSPITAL_COMMUNITY)
Admission: RE | Admit: 2023-10-28 | Discharge: 2023-10-28 | Disposition: A | Discharge status: Home / Self Care | Source: Ambulatory Visit | Attending: Urology | Admitting: Urology

## 2023-10-28 ENCOUNTER — Other Ambulatory Visit: Payer: Self-pay

## 2023-10-28 ENCOUNTER — Encounter (HOSPITAL_COMMUNITY): Payer: Self-pay

## 2023-10-28 VITALS — BP 131/70 | HR 86 | Temp 98.7°F | Resp 20 | Ht 74.0 in | Wt 213.0 lb

## 2023-10-28 DIAGNOSIS — Z8679 Personal history of other diseases of the circulatory system: Secondary | ICD-10-CM | POA: Insufficient documentation

## 2023-10-28 DIAGNOSIS — K766 Portal hypertension: Secondary | ICD-10-CM | POA: Diagnosis not present

## 2023-10-28 DIAGNOSIS — E119 Type 2 diabetes mellitus without complications: Secondary | ICD-10-CM | POA: Insufficient documentation

## 2023-10-28 DIAGNOSIS — K219 Gastro-esophageal reflux disease without esophagitis: Secondary | ICD-10-CM | POA: Diagnosis not present

## 2023-10-28 DIAGNOSIS — Z8546 Personal history of malignant neoplasm of prostate: Secondary | ICD-10-CM | POA: Insufficient documentation

## 2023-10-28 DIAGNOSIS — D696 Thrombocytopenia, unspecified: Secondary | ICD-10-CM | POA: Diagnosis not present

## 2023-10-28 DIAGNOSIS — I10 Essential (primary) hypertension: Secondary | ICD-10-CM | POA: Diagnosis not present

## 2023-10-28 DIAGNOSIS — G4733 Obstructive sleep apnea (adult) (pediatric): Secondary | ICD-10-CM | POA: Insufficient documentation

## 2023-10-28 DIAGNOSIS — Z87891 Personal history of nicotine dependence: Secondary | ICD-10-CM | POA: Diagnosis not present

## 2023-10-28 DIAGNOSIS — K7030 Alcoholic cirrhosis of liver without ascites: Secondary | ICD-10-CM | POA: Insufficient documentation

## 2023-10-28 DIAGNOSIS — Z794 Long term (current) use of insulin: Secondary | ICD-10-CM | POA: Diagnosis not present

## 2023-10-28 DIAGNOSIS — Z01812 Encounter for preprocedural laboratory examination: Secondary | ICD-10-CM | POA: Diagnosis not present

## 2023-10-28 DIAGNOSIS — M1990 Unspecified osteoarthritis, unspecified site: Secondary | ICD-10-CM | POA: Insufficient documentation

## 2023-10-28 DIAGNOSIS — Z856 Personal history of leukemia: Secondary | ICD-10-CM | POA: Diagnosis not present

## 2023-10-28 DIAGNOSIS — Z923 Personal history of irradiation: Secondary | ICD-10-CM | POA: Diagnosis not present

## 2023-10-28 DIAGNOSIS — I480 Paroxysmal atrial fibrillation: Secondary | ICD-10-CM | POA: Insufficient documentation

## 2023-10-28 DIAGNOSIS — Z01818 Encounter for other preprocedural examination: Secondary | ICD-10-CM

## 2023-10-28 DIAGNOSIS — Z8711 Personal history of peptic ulcer disease: Secondary | ICD-10-CM | POA: Insufficient documentation

## 2023-10-28 DIAGNOSIS — I2510 Atherosclerotic heart disease of native coronary artery without angina pectoris: Secondary | ICD-10-CM

## 2023-10-28 DIAGNOSIS — N35112 Postinfective bulbous urethral stricture, not elsewhere classified: Secondary | ICD-10-CM | POA: Diagnosis not present

## 2023-10-28 HISTORY — DX: Pneumonia, unspecified organism: J18.9

## 2023-10-28 HISTORY — DX: Essential (primary) hypertension: I10

## 2023-10-28 LAB — COMPREHENSIVE METABOLIC PANEL WITH GFR
ALT: 10 U/L (ref 0–44)
AST: 23 U/L (ref 15–41)
Albumin: 4.2 g/dL (ref 3.5–5.0)
Alkaline Phosphatase: 93 U/L (ref 38–126)
Anion gap: 13 (ref 5–15)
BUN: 21 mg/dL (ref 8–23)
CO2: 23 mmol/L (ref 22–32)
Calcium: 9.4 mg/dL (ref 8.9–10.3)
Chloride: 104 mmol/L (ref 98–111)
Creatinine, Ser: 1.11 mg/dL (ref 0.61–1.24)
GFR, Estimated: >60 mL/min (ref >60)
Glucose, Bld: 146 mg/dL — ABNORMAL HIGH (ref 70–99)
Potassium: 4.4 mmol/L (ref 3.5–5.1)
Sodium: 139 mmol/L (ref 135–145)
Total Bilirubin: 1.1 mg/dL (ref 0.0–1.2)
Total Protein: 6.5 g/dL (ref 6.5–8.1)

## 2023-10-28 LAB — CBC
HCT: 42.3 % (ref 39.0–52.0)
Hemoglobin: 14.2 g/dL (ref 13.0–17.0)
MCH: 30.9 pg (ref 26.0–34.0)
MCHC: 33.6 g/dL (ref 30.0–36.0)
MCV: 92.2 fL (ref 80.0–100.0)
Platelets: 102 K/uL — ABNORMAL LOW (ref 150–400)
RBC: 4.59 MIL/uL (ref 4.22–5.81)
RDW: 13.1 % (ref 11.5–15.5)
WBC: 8.9 K/uL (ref 4.0–10.5)
nRBC: 0 % (ref 0.0–0.2)

## 2023-10-28 LAB — HEMOGLOBIN A1C
Hgb A1c MFr Bld: 6 % — ABNORMAL HIGH (ref 4.8–5.6)
Mean Plasma Glucose: 125.5 mg/dL

## 2023-10-28 LAB — GLUCOSE, CAPILLARY: Glucose-Capillary: 161 mg/dL — ABNORMAL HIGH (ref 70–99)

## 2023-10-28 NOTE — H&P (Signed)
 I was consulted today to assess the patient's urinary incontinence. He does have urge incontinence. He says he leaks with coughing sneezing. No bedwetting. Wears 1 pad a day that is damp. He had prostate cancer treated in 2019 with radiation. He failed Flomax. I think he has had some lower urinary tract symptoms in the past noted by Dr. Carolee. PSA in 2020 was 0.78. He is not regularly follow-up. He did say the main problem was almost no warning any leaks. He rarely leaks with coughing sneezing. Symptoms have worsened over 2 years and the warning is becoming less and less affecting his quality of life   He voids every 2-3 hours gets up 3 times at night. Flow is reasonable and he does feel empty   He has had low back surgery   He had a flat prostatic bed on digital rectal examination no obvious recurrent   Bladder scan residual 93 mL. Chart reviewed. Urinalysis reviewed and sent for culture   She has mixed incontinence but in my opinion likely has an overactive bladder primarily. He does not have a significant risk factor for stress incontinence. Call if urine culture positive. I will see him in 5 weeks on Gemtesa  samples and prescription for cystoscopy. I will look for radiation changes. He would like a PSA today as another baseline   TOday  PSA 0.05. Last culture negative. Frequency stable.  Minimal improvement with Gemtesa . Patient said he can void and 5 minutes later double void a moderate amount and larger amount  Patient underwent flexible cystoscopy. Penile bulbar urethra normal. The level of the membranous urethra he had significant narrowing and iWeb or membranous tissue at that level. I did not push against it. It was challenging to see the opening but it looked quite flimsy.  Picture drawn. I discussed cystoscopy retrograde and balloon dilation of proximal urethra and bladder neck. Inability to insert a wire possibility discussed with sequelae. Unfortunately dilating the urethra probably  will not help her urgency and sometimes could make it worse but this would be uncommon. I do not see where he has had a recent PSA and this was offered. The findings were quite impressive and I think dilation is recommended versus watchful waiting. 1 would not be able to insert a Foley catheter now. He did have some red blood cells in the urine today but did not on the last visit. I will keep an eye on this. Call if culture positive   I went over dilation and sequelae in full detail. Picture was drawn and he was pleased. 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 I think he has excellent expectations with the dilation. Hopefully this will help flow but also OAB symptoms. I forgot to talk about recurrence rate but I will next time after the procedure. He understands I will try to leave him without a catheter. He will go to the emergency room tonight if you have trouble to void. I did not overfill him today and it was a quick cystoscopy nontraumatic   He may have left but he agreed to get a PSA and I ordered it     ALLERGIES: No Allergies    MEDICATIONS: Gemtesa  75 MG Tablet 1 tablet PO Daily  metFORMIN  HCl 500 MG Tablet  Atorvastatin  Calcium  10 MG Tablet  Gabapentin   Pantoprazole  Sodium     GU PSH: Prostate Needle Biopsy -  2019 TRANSPERI NEEDLE PLACE, PROS - 2020 Transperineal Plmt Biodegradable Matrl 1/Mlt Njx - 2020       PSH Notes: Cataract Surgery  Ulcer removed near Esophagus     NON-GU PSH: Surgical Pathology, Gross And Microscopic Examination For Prostate Needle - 2019     GU PMH: BPH w/LUTS - 09/03/2023, - 2019 Mixed incontinence - 09/03/2023 Microscopic hematuria - 2020 Nocturia - 2020, Nocturia, - 2016 Urinary Urgency - 2020, - 2019 Weak Urinary Stream - 2020 Hydrocele, Bilateral, Working diagnosis. No palpable induration of the testicles/epididymis. No crepitus, fluctuance. -  2020 Prostate Cancer - 2020, - 2020, - 2019 Elevated PSA - 2019 Urge incontinence, Urge incontinence of urine - 2016 Urinary Frequency, Increased urinary frequency - 2016    NON-GU PMH: Encounter for general adult medical examination without abnormal findings, Encounter for preventive health examination - 2015 Personal history of other diseases of the digestive system, History of esophageal reflux - 2015 Personal history of other diseases of the musculoskeletal system and connective tissue, History of gout - 2015 Arthritis Diabetes Type 2 Diverticulosis GERD Gout Other cirrhosis of liver Sleep Apnea    FAMILY HISTORY: 2 sons - Son Death of family member - Runs In Family leukemia - Runs In Family Lung Cancer - Mother malignant neoplasm - Runs In Family   SOCIAL HISTORY: Marital Status: Married Preferred Language: English; Race: White Current Smoking Status: Patient does not smoke anymore. Smoked for 15 years. Smoked 2 packs per day.   Tobacco Use Assessment Completed: Used Tobacco in last 30 days? Does not use smokeless tobacco. Drinks 2 drinks per week.  Drinks 1 caffeinated drink per day.     Notes: Alcohol use, Former smoker, Married, Occupation, Number of children, Caffeine use   REVIEW OF SYSTEMS:    GU Review Male:   Patient reports frequent urination, get up at night to urinate, and leakage of urine. Patient denies hard to postpone urination, burning/ pain with urination, stream starts and stops, trouble starting your stream, have to strain to urinate , erection problems, and penile pain.  Gastrointestinal (Upper):   Patient denies nausea, vomiting, and indigestion/ heartburn.  Gastrointestinal (Lower):   Patient denies diarrhea and constipation.  Constitutional:   Patient denies fever, night sweats, weight loss, and fatigue.  Skin:   Patient denies skin rash/ lesion and itching.  Eyes:   Patient denies blurred vision and double vision.  Ears/ Nose/ Throat:   Patient  denies sore throat and sinus problems.  Hematologic/Lymphatic:   Patient denies swollen glands and easy bruising.  Cardiovascular:   Patient denies leg swelling and chest pains.  Respiratory:   Patient denies cough and shortness of breath.  Endocrine:   Patient denies excessive thirst.  Musculoskeletal:   Patient denies back pain and joint pain.  Neurological:   Patient denies headaches and dizziness.  Psychologic:   Patient denies depression and anxiety.   VITAL SIGNS: None   Complexity of Data:   09/03/23 09/03/23 10/07/18 07/08/18  PSA  Total PSA N/A  0.05 ng/mL 0.84 ng/mL 1.65 ng/mL    PROCEDURES:         Flexible Cystoscopy - 52000  Risks, benefits, and some of the potential complications of the procedure were discussed at length with the patient. All questions were answered. Informed consent was obtained. Sterile technique and intraurethral analgesia were used.  Prostate:  Patient underwent flexible cystoscopy. Penile bulbar urethra normal. The level of the membranous urethra he had significant narrowing and iWeb or membranous  tissue at that level. I did not push against it. It was challenging to see the opening but it looked quite flimsy.      The lower urinary tract was carefully examined. The procedure was well-tolerated and without complications. Instructions were given to call the office immediately for bloody urine, difficulty urinating, urinary retention, painful or frequent urination, fever, chills, nausea, vomiting or other illness. The patient stated that he understood these instructions and would comply with them.         Visit Complexity - G2211          Urinalysis w/Scope Dipstick Dipstick Cont'd Micro  Color: Yellow Bilirubin: Neg mg/dL WBC/hpf: NS (Not Seen)  Appearance: Clear Ketones: Neg mg/dL RBC/hpf: 3 - 89/yeq  Specific Gravity: 1.025 Blood: 2+ ery/uL Bacteria: Rare (0-9/hpf)  pH: 5.5 Protein: 2+ mg/dL Cystals: NS (Not Seen)  Glucose: Neg mg/dL  Urobilinogen: 0.2 mg/dL Casts: NS (Not Seen)    Nitrites: Neg Trichomonas: Not Present    Leukocyte Esterase: Neg leu/uL Mucous: Not Present      Epithelial Cells: 0 - 5/hpf      Yeast: NS (Not Seen)      Sperm: Not Present    ASSESSMENT:      ICD-10 Details  1 GU:   Mixed incontinence - N39.46   2   BPH w/LUTS - N40.1      PLAN:           Orders Labs PSA, Urine Culture          Schedule Return Visit/Planned Activity: Return PRN - Office Visit             Note: I will call him

## 2023-10-28 NOTE — Patient Instructions (Signed)
 SURGICAL WAITING ROOM VISITATION Patients having surgery or a procedure may have no more than 2 support people in the waiting area - these visitors may rotate in the visitor waiting room.   If the patient needs to stay at the hospital during part of their recovery, the visitor guidelines for inpatient rooms apply.  PRE-OP VISITATION  Pre-op nurse will coordinate an appropriate time for 1 support person to accompany the patient in pre-op.  This support person may not rotate.  This visitor will be contacted when the time is appropriate for the visitor to come back in the pre-op area.  Please refer to the [facility] website for the visitor guidelines for Inpatients (after your surgery is over and you are in a regular room).  You are not required to quarantine at this time prior to your surgery. However, you must do this: Hand Hygiene often Do NOT share personal items Notify your provider if you are in close contact with someone who has COVID or you develop fever 100.4 or greater, new onset of sneezing, cough, sore throat, shortness of breath or body aches.  If you test positive for Covid or have been in contact with anyone that has tested positive in the last 10 days please notify you surgeon.    Your procedure is scheduled on:  TUESDAY  11-03-2023   Report to [facility] Main Entrance: [address] entrance where the [employer] is available.   Report to admitting at: 10:00    AM  Call this number if you have any questions or problems the morning of surgery [phone]  DO NOT EAT OR DRINK ANYTHING AFTER MIDNIGHT THE NIGHT PRIOR TO YOUR SURGERY / PROCEDURE.   FOLLOW  ANY ADDITIONAL PRE OP INSTRUCTIONS YOU RECEIVED FROM YOUR SURGEON'S OFFICE!!!   Oral Hygiene is also important to reduce your risk of infection.        Remember - BRUSH YOUR TEETH THE MORNING OF SURGERY WITH YOUR REGULAR TOOTHPASTE  Do NOT smoke after Midnight the night before surgery.  STOP TAKING all Vitamins,  Herbs and supplements 1 week before your surgery.   Take ONLY these medicines the morning of surgery with A SIP OF WATER: Pantoprazole , Gabapentin    You may not have any metal on your body including  jewelry, and body piercing  Do not wear  lotions, powders, cologne, or deodorant  Men may shave face and neck.  Contacts, Hearing Aids, dentures or bridgework may not be worn into surgery. DENTURES WILL BE REMOVED PRIOR TO SURGERY PLEASE DO NOT APPLY Poly grip OR ADHESIVES!!!   Patients discharged on the day of surgery will not be allowed to drive home.  Someone NEEDS to stay with you for the first 24 hours after anesthesia.  Do not bring your home medications to the hospital. The Pharmacy will dispense medications listed on your medication list to you during your admission in the Hospital.  Please read over the following fact sheets you were given: IF YOU HAVE QUESTIONS ABOUT YOUR PRE-OP INSTRUCTIONS, PLEASE CALL [phone].   [place] - Preparing for Surgery Before surgery, you can play an important role.  Because skin is not sterile, your skin needs to be as free of germs as possible.  You can reduce the number of germs on your skin by washing with CHG (chlorahexidine gluconate) soap before surgery.  CHG is an antiseptic cleaner which kills germs and bonds with the skin to continue killing germs even after washing. Please DO NOT use  if you have an allergy to CHG or antibacterial soaps.  If your skin becomes reddened/irritated stop using the CHG and inform your nurse when you arrive at Short Stay. Do not shave (including legs and underarms) for at least 48 hours prior to the first CHG shower.  You may shave your face/neck.  Please follow these instructions carefully:  1.  Shower with CHG Soap the night before surgery and the  morning of surgery.  2.  If you choose to wash your hair, wash your hair first as usual with your normal  shampoo.  3.  After you shampoo, rinse your hair and  body thoroughly to remove the shampoo.                             4.  Use CHG as you would any other liquid soap.  You can apply chg directly to the skin and wash.  Gently with a scrungie or clean washcloth.  5.  Apply the CHG Soap to your body ONLY FROM THE NECK DOWN.   Do not use on face/ open                           Wound or open sores. Avoid contact with eyes, ears mouth and genitals (private parts).                       Wash face,  Genitals (private parts) with your normal soap.             6.  Wash thoroughly, paying special attention to the area where your  surgery  will be performed.  7.  Thoroughly rinse your body with warm water from the neck down.  8.  DO NOT shower/wash with your normal soap after using and rinsing off the CHG Soap.            9.  Pat yourself dry with a clean towel.            10.  Wear clean pajamas.            11.  Place clean sheets on your bed the night of your first shower and do not  sleep with pets.  ON THE DAY OF SURGERY : Do not apply any lotions/deodorants the morning of surgery.  Please wear clean clothes to the hospital/surgery center.    FAILURE TO FOLLOW THESE INSTRUCTIONS MAY RESULT IN THE CANCELLATION OF YOUR SURGERY  PATIENT SIGNATURE_________________________________  NURSE SIGNATURE__________________________________  ________________________________________________________________________

## 2023-10-29 ENCOUNTER — Encounter (HOSPITAL_COMMUNITY): Payer: Self-pay

## 2023-10-29 NOTE — Progress Notes (Signed)
 Case: [id number] Date/Time: 11/03/23 1145   Procedures:      CYSTOSCOPY, WITH URETHRAL DILATION     URETHROGRAM   Anesthesia type: General   Diagnosis: Postinfective bulbous urethral stricture [N35.112]   Pre-op diagnosis: URETHRAL STRICTURE   Location: [facility] ROOM 03 / [facility]   Surgeons: Gaston Hamilton, MD       DISCUSSION: Rodgers Kettering ia a 79 yo male with PMH of former smoking, HTN, WPW, PAF (not anticoagulated), OSA (no CPAP), GERD, PUD, esophageal stricture s/p dilation, hx of ETOH abuse with cirrhosis and portal HTN, IDDM, prostate cancer s/p XRT (2020) CLL, arthritis.  Recent admission from 6/5-6/16 for AMS and sepsis secondary to retroperitoneal hematoma infection. S/p IR intervention and drainage of 60 cc of purulent fluid. He completed abx as outpatient. ID advised f/u prn.  Patient previously followed with Cardiology for hx of PAF and WPW. Last seen in 2023. Per Dr. Waddell he not a candidate for anticoagulation due to cirrhosis. Regarding his WPW: no symptoms and no residual evidence of ventricular pre-excitation.SABRA He was advised to f/u as needed.  Hx of cirrhosis. Has thrombocytopenia on pre op labs (102). He follows with GI. Had EGD on 8/14 as screening for esophageal varices which were not seen on exam.  Hx of CLL. Last evaluated in October 2024-did not need treatment at that time  Seen by PCP on 8/12 for acute visit. For hand and feet laceration from glass breaking. Rx given for Keflex .  VS: BP 131/70 Comment: right arm sitting  Pulse 86   Temp 37.1 C (Oral)   Resp 20   Ht 6' 2 (1.88 m)   Wt 96.6 kg   SpO2 97%   BMI 27.35 kg/m   PROVIDERS: Joshua Debby CROME, MD   LABS: Labs reviewed: Acceptable for surgery. (all labs ordered are listed, but only abnormal results are displayed)  Labs Reviewed  HEMOGLOBIN A1C - Abnormal; Notable for the following components:      Result Value   Hgb A1c MFr Bld 6.0 (*)    All other components within normal limits   COMPREHENSIVE METABOLIC PANEL WITH GFR - Abnormal; Notable for the following components:   Glucose, Bld 146 (*)    All other components within normal limits  CBC - Abnormal; Notable for the following components:   Platelets 102 (*)    All other components within normal limits  GLUCOSE, CAPILLARY - Abnormal; Notable for the following components:   Glucose-Capillary 161 (*)    All other components within normal limits     IMAGES:   EKG:   Echo 12/06/22:  IMPRESSIONS    1. Left ventricular ejection fraction, by estimation, is 55 to 60%. The left ventricle has normal function. The left ventricle has no regional wall motion abnormalities. There is mild left ventricular hypertrophy. Left ventricular diastolic parameters are indeterminate.  2. Right ventricular systolic function is normal. The right ventricular size is mildly enlarged. There is moderately elevated pulmonary artery systolic pressure.  3. Left atrial size was mildly dilated.  4. The mitral valve is normal in structure. Trivial mitral valve regurgitation. No evidence of mitral stenosis.  5. The tricuspid valve is abnormal.  6. The aortic valve is tricuspid. Aortic valve regurgitation is not visualized. No aortic stenosis is present.  7. The inferior vena cava is dilated in size with <50% respiratory variability, suggesting right atrial pressure of 15 mmHg.  Past Medical History:  Diagnosis Date   Acrophobia    Alcoholic cirrhosis (HCC)  10/04/2015   Anemia    Arthritis    foot by big toe   BPH associated with nocturia    Cataract    removed both eyes   Cholecystitis 05/2022   tx Abx   Chronic cough    PMH of   CLL (chronic lymphocytic leukemia) (HCC)    COVID-19 12/2018   Diabetes mellitus without complication (HCC)    Diverticulosis 07/03/2010   Colonoscopy.    Duodenal ulcer 2017   Fallen arches    Bilateral   GERD (gastroesophageal reflux disease)    Gout    Granuloma annulare    Hx of  adenomatous colonic polyps multiple   Hydrocele 2011   Large septated right hydrocele   Hypertension    Liver cyst    Liver lesion    Nonspecific elevation of levels of transaminase or lactic acid dehydrogenase (LDH)    Obesity    Peripheral neuropathy    Plantar fasciitis    PMH of   Pneumonia    Portal hypertension (HCC) 2017   Prostate cancer (HCC)    Right shoulder pain 11/2017   Sleep apnea    no cpap, patient denies   Thiamine  deficiency    Wears reading eyeglasses    WPW (Wolff-Parkinson-White syndrome) 02/05/2019    Past Surgical History:  Procedure Laterality Date   BACK SURGERY  04/28/2022   CATARACT EXTRACTION, BILATERAL  12/2011   Dr Roz   CHOLECYSTECTOMY, ROBOT-ASSISTED, LAPAROSCOPIC  03/2023   [facility]   COLONOSCOPY  2017   COLONOSCOPY W/ POLYPECTOMY  07/03/2010   2 adenomas, diverticulosis on right. Dr Avram   CYSTOSCOPY N/A 03/15/2018   Procedure: PHYLLIS SIDE;  Surgeon: Carolee Sherwood JONETTA DOUGLAS, MD;  [facility];  Service: Urology;  Laterality: N/A;  NO SEEDS FOUND IN BLADDER   ESOPHAGOGASTRODUODENOSCOPY (EGD) WITH PROPOFOL  N/A 01/08/2016   Procedure: ESOPHAGOGASTRODUODENOSCOPY (EGD) WITH PROPOFOL ;  Surgeon: Gwendlyn ONEIDA Buddy, MD;  [facility];  Service: Endoscopy;  Laterality: N/A;   FLEXIBLE SIGMOIDOSCOPY  2000   IR EXCHANGE BILIARY DRAIN  02/17/2023   IR PATIENT EVAL TECH 0-60 MINS  12/26/2022   IR PERC CHOLECYSTOSTOMY  12/05/2022   IR PERC CHOLECYSTOSTOMY  12/09/2022   IR RADIOLOGIST EVAL & MGMT  01/16/2023   IR RADIOLOGIST EVAL & MGMT  07/31/2023   IR RADIOLOGIST EVAL & MGMT  08/05/2023   RADIOACTIVE SEED IMPLANT N/A 03/15/2018   Procedure: RADIOACTIVE SEED IMPLANT/BRACHYTHERAPY IMPLANT;  Surgeon: Carolee Sherwood JONETTA DOUGLAS, MD;  Location: [facility];  Service: Urology;  Laterality: N/A;   9 SEEDS IMPLANTED   RADIOLOGY WITH ANESTHESIA N/A 12/09/2022   Procedure: IR WITH ANESTHESIA;  Surgeon: Radiologist,  Medication, MD;  Location: [facility];  Service: Radiology;  Laterality: N/A;   SIGMOIDOSCOPY     SPACE OAR INSTILLATION N/A 03/15/2018   Procedure: SPACE OAR INSTILLATION;  Surgeon: Carolee Sherwood JONETTA DOUGLAS, MD;  Location[facility];  Service: Urology;  Laterality: N/A;   WISDOM TOOTH EXTRACTION      MEDICATIONS:  gabapentin  (NEURONTIN ) 300 MG capsule   Insulin  Pen Needle (BD PEN NEEDLE MINI ULTRAFINE) 31G X 5 MM MISC   pantoprazole  (PROTONIX ) 40 MG tablet   thiamine  (VITAMIN B-1) 50 MG tablet   zinc  gluconate 50 MG tablet   No current facility-administered medications for this encounter.   Burnard CHRISTELLA Odis DEVONNA [facility] Short Stay/Anesthesiology [facility] Phone [phone] 10/29/2023 2:37 PM

## 2023-10-29 NOTE — Anesthesia Preprocedure Evaluation (Addendum)
 Anesthesia Evaluation  Patient identified by MRN, date of birth, ID band Patient awake    Reviewed: Allergy & Precautions, H&P , NPO status , Patient's Chart, lab work & pertinent test results  Airway Mallampati: II   Neck ROM: full    Dental   Pulmonary sleep apnea , former smoker   breath sounds clear to auscultation       Cardiovascular hypertension, + Peripheral Vascular Disease   Rhythm:regular Rate:Normal     Neuro/Psych  PSYCHIATRIC DISORDERS Anxiety        GI/Hepatic PUD,GERD  ,,(+) Cirrhosis     substance abuse  alcohol usePortal hypertension   Endo/Other  diabetes, Type 2    Renal/GU      Musculoskeletal  (+) Arthritis ,    Abdominal   Peds  Hematology CLL   Anesthesia Other Findings   Reproductive/Obstetrics                              Anesthesia Physical Anesthesia Plan  ASA: 3  Anesthesia Plan: General   Post-op Pain Management:    Induction: Intravenous  PONV Risk Score and Plan: 1 and Ondansetron , Dexamethasone  and Treatment may vary due to age or medical condition  Airway Management Planned: LMA  Additional Equipment:   Intra-op Plan:   Post-operative Plan: Extubation in OR  Informed Consent: I have reviewed the patients History and Physical, chart, labs and discussed the procedure including the risks, benefits and alternatives for the proposed anesthesia with the patient or authorized representative who has indicated his/her understanding and acceptance.     Dental advisory given  Plan Discussed with: CRNA, Anesthesiologist and Surgeon  Anesthesia Plan Comments: (See PAT note from 9/24)         Anesthesia Quick Evaluation

## 2023-11-02 ENCOUNTER — Inpatient Hospital Stay: Attending: Hematology and Oncology

## 2023-11-02 DIAGNOSIS — C9110 Chronic lymphocytic leukemia of B-cell type not having achieved remission: Secondary | ICD-10-CM | POA: Insufficient documentation

## 2023-11-02 LAB — CMP (CANCER CENTER ONLY)
ALT: 9 U/L (ref 0–44)
AST: 19 U/L (ref 15–41)
Albumin: 4.5 g/dL (ref 3.5–5.0)
Alkaline Phosphatase: 86 U/L (ref 38–126)
Anion gap: 5 (ref 5–15)
BUN: 18 mg/dL (ref 8–23)
CO2: 31 mmol/L (ref 22–32)
Calcium: 9.6 mg/dL (ref 8.9–10.3)
Chloride: 103 mmol/L (ref 98–111)
Creatinine: 1.05 mg/dL (ref 0.61–1.24)
GFR, Estimated: >60 mL/min (ref >60)
Glucose, Bld: 149 mg/dL — ABNORMAL HIGH (ref 70–99)
Potassium: 4.4 mmol/L (ref 3.5–5.1)
Sodium: 139 mmol/L (ref 135–145)
Total Bilirubin: 1.2 mg/dL (ref 0.0–1.2)
Total Protein: 7.1 g/dL (ref 6.5–8.1)

## 2023-11-02 LAB — CBC WITH DIFFERENTIAL (CANCER CENTER ONLY)
Abs Immature Granulocytes: 0.02 K/uL (ref 0.00–0.07)
Basophils Absolute: 0 K/uL (ref 0.0–0.1)
Basophils Relative: 0 %
Eosinophils Absolute: 0.1 K/uL (ref 0.0–0.5)
Eosinophils Relative: 1 %
HCT: 43.7 % (ref 39.0–52.0)
Hemoglobin: 15.3 g/dL (ref 13.0–17.0)
Immature Granulocytes: 0 %
Lymphocytes Relative: 59 %
Lymphs Abs: 5 K/uL — ABNORMAL HIGH (ref 0.7–4.0)
MCH: 31.7 pg (ref 26.0–34.0)
MCHC: 35 g/dL (ref 30.0–36.0)
MCV: 90.7 fL (ref 80.0–100.0)
Monocytes Absolute: 0.5 K/uL (ref 0.1–1.0)
Monocytes Relative: 5 %
Neutro Abs: 3 K/uL (ref 1.7–7.7)
Neutrophils Relative %: 35 %
Platelet Count: 111 K/uL — ABNORMAL LOW (ref 150–400)
RBC: 4.82 MIL/uL (ref 4.22–5.81)
RDW: 13 % (ref 11.5–15.5)
WBC Count: 8.6 K/uL (ref 4.0–10.5)
nRBC: 0 % (ref 0.0–0.2)

## 2023-11-02 LAB — LACTATE DEHYDROGENASE: LDH: 155 U/L (ref 98–192)

## 2023-11-03 ENCOUNTER — Ambulatory Visit (HOSPITAL_BASED_OUTPATIENT_CLINIC_OR_DEPARTMENT_OTHER): Payer: Self-pay | Admitting: Anesthesiology

## 2023-11-03 ENCOUNTER — Ambulatory Visit (HOSPITAL_COMMUNITY)
Admission: RE | Admit: 2023-11-03 | Discharge: 2023-11-03 | Disposition: A | Discharge status: Home / Self Care | Attending: Urology | Admitting: Urology

## 2023-11-03 ENCOUNTER — Encounter (HOSPITAL_COMMUNITY): Payer: Self-pay | Admitting: Urology

## 2023-11-03 ENCOUNTER — Encounter (HOSPITAL_COMMUNITY)
Admission: RE | Disposition: A | Discharge status: Home / Self Care | Payer: Self-pay | Source: Home / Self Care | Attending: Urology

## 2023-11-03 ENCOUNTER — Ambulatory Visit (HOSPITAL_COMMUNITY)

## 2023-11-03 ENCOUNTER — Other Ambulatory Visit: Payer: Self-pay | Admitting: Internal Medicine

## 2023-11-03 ENCOUNTER — Other Ambulatory Visit: Payer: Self-pay

## 2023-11-03 ENCOUNTER — Ambulatory Visit (HOSPITAL_COMMUNITY): Payer: Self-pay | Admitting: Physician Assistant

## 2023-11-03 DIAGNOSIS — Z87891 Personal history of nicotine dependence: Secondary | ICD-10-CM | POA: Diagnosis not present

## 2023-11-03 DIAGNOSIS — Z8711 Personal history of peptic ulcer disease: Secondary | ICD-10-CM | POA: Insufficient documentation

## 2023-11-03 DIAGNOSIS — K219 Gastro-esophageal reflux disease without esophagitis: Secondary | ICD-10-CM | POA: Insufficient documentation

## 2023-11-03 DIAGNOSIS — E119 Type 2 diabetes mellitus without complications: Secondary | ICD-10-CM

## 2023-11-03 DIAGNOSIS — Z79899 Other long term (current) drug therapy: Secondary | ICD-10-CM | POA: Diagnosis not present

## 2023-11-03 DIAGNOSIS — I10 Essential (primary) hypertension: Secondary | ICD-10-CM

## 2023-11-03 DIAGNOSIS — E1151 Type 2 diabetes mellitus with diabetic peripheral angiopathy without gangrene: Secondary | ICD-10-CM | POA: Diagnosis not present

## 2023-11-03 DIAGNOSIS — K7460 Unspecified cirrhosis of liver: Secondary | ICD-10-CM | POA: Diagnosis not present

## 2023-11-03 DIAGNOSIS — N35913 Unspecified membranous urethral stricture, male: Secondary | ICD-10-CM | POA: Insufficient documentation

## 2023-11-03 DIAGNOSIS — Z923 Personal history of irradiation: Secondary | ICD-10-CM | POA: Diagnosis not present

## 2023-11-03 DIAGNOSIS — F419 Anxiety disorder, unspecified: Secondary | ICD-10-CM | POA: Diagnosis not present

## 2023-11-03 DIAGNOSIS — N3946 Mixed incontinence: Secondary | ICD-10-CM | POA: Insufficient documentation

## 2023-11-03 DIAGNOSIS — Z8546 Personal history of malignant neoplasm of prostate: Secondary | ICD-10-CM | POA: Insufficient documentation

## 2023-11-03 DIAGNOSIS — Z01818 Encounter for other preprocedural examination: Secondary | ICD-10-CM

## 2023-11-03 DIAGNOSIS — N401 Enlarged prostate with lower urinary tract symptoms: Secondary | ICD-10-CM | POA: Diagnosis not present

## 2023-11-03 DIAGNOSIS — G4730 Sleep apnea, unspecified: Secondary | ICD-10-CM | POA: Diagnosis not present

## 2023-11-03 DIAGNOSIS — Z7984 Long term (current) use of oral hypoglycemic drugs: Secondary | ICD-10-CM | POA: Diagnosis not present

## 2023-11-03 DIAGNOSIS — I480 Paroxysmal atrial fibrillation: Secondary | ICD-10-CM | POA: Diagnosis not present

## 2023-11-03 DIAGNOSIS — G6289 Other specified polyneuropathies: Secondary | ICD-10-CM

## 2023-11-03 HISTORY — PX: URETHROGRAM: SHX6163

## 2023-11-03 HISTORY — PX: CYSTOSCOPY WITH URETHRAL DILATATION: SHX5125

## 2023-11-03 LAB — GLUCOSE, CAPILLARY
Glucose-Capillary: 164 mg/dL — ABNORMAL HIGH (ref 70–99)
Glucose-Capillary: 170 mg/dL — ABNORMAL HIGH (ref 70–99)

## 2023-11-03 SURGERY — CYSTOSCOPY, WITH URETHRAL DILATION
Anesthesia: General

## 2023-11-03 MED ORDER — DEXAMETHASONE SODIUM PHOSPHATE 10 MG/ML IJ SOLN
INTRAMUSCULAR | Status: AC
  Filled 2023-11-03: qty 1

## 2023-11-03 MED ORDER — CHLORHEXIDINE GLUCONATE 0.12 % MT SOLN
15.0000 mL | Freq: Once | OROMUCOSAL | Status: AC
  Administered 2023-11-03: 15 mL via OROMUCOSAL

## 2023-11-03 MED ORDER — INSULIN ASPART 100 UNIT/ML IJ SOLN
0.0000 [IU] | INTRAMUSCULAR | Status: DC | PRN
  Administered 2023-11-03: 2 [IU] via SUBCUTANEOUS

## 2023-11-03 MED ORDER — PROPOFOL 10 MG/ML IV BOLUS
INTRAVENOUS | Status: AC
  Filled 2023-11-03: qty 20

## 2023-11-03 MED ORDER — ONDANSETRON HCL 4 MG/2ML IJ SOLN: 4.0000 mg | Freq: Four times a day (QID) | INTRAMUSCULAR | Status: DC | PRN

## 2023-11-03 MED ORDER — DEXAMETHASONE SODIUM PHOSPHATE 10 MG/ML IJ SOLN
INTRAMUSCULAR | Status: DC | PRN
  Administered 2023-11-03: 4 mg via INTRAVENOUS

## 2023-11-03 MED ORDER — LIDOCAINE HCL (PF) 2 % IJ SOLN
INTRAMUSCULAR | Status: DC | PRN
  Administered 2023-11-03: 40 mg via INTRADERMAL

## 2023-11-03 MED ORDER — STERILE WATER FOR IRRIGATION IR SOLN
Status: DC | PRN
  Administered 2023-11-03: 500 mL

## 2023-11-03 MED ORDER — SODIUM CHLORIDE 0.9 % IR SOLN
Status: DC | PRN
  Administered 2023-11-03: 3000 mL

## 2023-11-03 MED ORDER — INSULIN ASPART 100 UNIT/ML IJ SOLN
INTRAMUSCULAR | Status: AC
  Filled 2023-11-03: qty 1

## 2023-11-03 MED ORDER — SUCCINYLCHOLINE CHLORIDE 200 MG/10ML IV SOSY
PREFILLED_SYRINGE | INTRAVENOUS | Status: DC | PRN
  Administered 2023-11-03: 140 mg via INTRAVENOUS

## 2023-11-03 MED ORDER — ONDANSETRON HCL 4 MG/2ML IJ SOLN
INTRAMUSCULAR | Status: DC | PRN
  Administered 2023-11-03: 4 mg via INTRAVENOUS

## 2023-11-03 MED ORDER — FENTANYL CITRATE (PF) 100 MCG/2ML IJ SOLN
INTRAMUSCULAR | Status: AC
  Filled 2023-11-03: qty 2

## 2023-11-03 MED ORDER — CIPROFLOXACIN IN D5W 400 MG/200ML IV SOLN
400.0000 mg | INTRAVENOUS | Status: AC
  Administered 2023-11-03: 400 mg via INTRAVENOUS
  Filled 2023-11-03: qty 200

## 2023-11-03 MED ORDER — FENTANYL CITRATE (PF) 100 MCG/2ML IJ SOLN
INTRAMUSCULAR | Status: DC | PRN
  Administered 2023-11-03 (×2): 25 ug via INTRAVENOUS
  Administered 2023-11-03: 50 ug via INTRAVENOUS

## 2023-11-03 MED ORDER — ONDANSETRON HCL 4 MG/2ML IJ SOLN
INTRAMUSCULAR | Status: AC
Start: 2023-11-03 — End: 2023-11-03
  Filled 2023-11-03: qty 2

## 2023-11-03 MED ORDER — ORAL CARE MOUTH RINSE: 15.0000 mL | Freq: Once | OROMUCOSAL | Status: AC

## 2023-11-03 MED ORDER — IOHEXOL 300 MG/ML  SOLN
INTRAMUSCULAR | Status: DC | PRN
  Administered 2023-11-03: 10 mL

## 2023-11-03 MED ORDER — PROPOFOL 10 MG/ML IV BOLUS
INTRAVENOUS | Status: DC | PRN
Start: 2023-11-03 — End: 2023-11-03
  Administered 2023-11-03: 50 mg via INTRAVENOUS
  Administered 2023-11-03: 150 mg via INTRAVENOUS

## 2023-11-03 MED ORDER — LACTATED RINGERS IV SOLN: INTRAVENOUS | Status: DC

## 2023-11-03 MED ORDER — OXYCODONE HCL 5 MG/5ML PO SOLN: 5.0000 mg | Freq: Once | ORAL | Status: DC | PRN

## 2023-11-03 MED ORDER — FENTANYL CITRATE PF 50 MCG/ML IJ SOSY: 25.0000 ug | PREFILLED_SYRINGE | INTRAMUSCULAR | Status: DC | PRN

## 2023-11-03 MED ORDER — OXYCODONE HCL 5 MG PO TABS: 5.0000 mg | ORAL_TABLET | Freq: Once | ORAL | Status: DC | PRN

## 2023-11-03 MED ORDER — LIDOCAINE HCL (PF) 2 % IJ SOLN
INTRAMUSCULAR | Status: AC
  Filled 2023-11-03: qty 5

## 2023-11-03 SURGICAL SUPPLY — 13 items
BAG URO CATCHER STRL LF (MISCELLANEOUS) ×1 IMPLANT
CATH BALLOON NEPH ULTRXX 8X125 (BALLOONS) IMPLANT
CLOTH BEACON ORANGE TIMEOUT ST (SAFETY) ×1 IMPLANT
DEVICE INFLATION SPHERE 20ML (MISCELLANEOUS) IMPLANT
GLOVE SURG LX STRL 7.5 STRW (GLOVE) ×1 IMPLANT
GOWN STRL REUS W/ TWL XL LVL3 (GOWN DISPOSABLE) ×1 IMPLANT
GUIDEWIRE STR DUAL SENSOR (WIRE) ×1 IMPLANT
HOLDER FOLEY CATH W/STRAP (MISCELLANEOUS) ×1 IMPLANT
KIT TURNOVER KIT A (KITS) ×1 IMPLANT
PACK CYSTO (CUSTOM PROCEDURE TRAY) ×1 IMPLANT
PENCIL SMOKE EVACUATOR (MISCELLANEOUS) IMPLANT
SCALPEL HALF MOON BLADE (MISCELLANEOUS) ×1 IMPLANT
TUBING CONNECTING 10 (TUBING) IMPLANT

## 2023-11-03 NOTE — Op Note (Signed)
 Preoperative diagnosis: Membranous urethral stricture Postoperative diagnosis: Membranous urethral stricture Surgery: Cystoscopy; retrograde urethrogram balloon dilation of stricture Surgeon Dr. Glendia Elizabeth  The patient consented the above procedure with the above diagnosis.  17 [place] scope was used.  He had mild meatal stenosis.  Scope was easily inserted.  There was a spot of blood that normalized as the case went on.  I performed cystoscopy had a 6 French stricture likely distal to the membranous urethra.  I then did a gentle retrograde urethrogram with 10 cc of contrast and no contrast reach beyond the membranous urethra.  I could see the enlarged prostate with prostatic radiation seeds  Under fluoroscopic and cystoscopic guidance I easily passed a sensor wire into the bladder.  Balloon dilation catheter was utilized.  It bridged the stricture very nice into the bladder.  I dilated at 17 atm for 5 minutes.  I remove the balloon.  I then cystoscope the patient  Patient has a 1.5 cm stricture at the lower apex of the prostate and membranous urethra.  It was wide open and not bleeding.  He had bilobar enlargement of the prostate that was minimally friable.  Bladder mucosa and trigone were normal.  No cystitis.  No carcinoma  Procedure went very well.  He left him without a catheter.  He will be followed as per protocol.

## 2023-11-03 NOTE — Discharge Instructions (Signed)
 I have reviewed discharge instructions in detail with the patient. They will follow-up with me or their physician as scheduled. My nurse will also be calling the patients as per protocol.

## 2023-11-03 NOTE — Anesthesia Procedure Notes (Addendum)
 Procedure Name: Intubation Date/Time: 11/03/2023 12:10 PM  Performed by: Augusta Daved SAILOR, CRNAPre-anesthesia Checklist: Patient identified, Emergency Drugs available, Suction available and Patient being monitored Patient Re-evaluated:Patient Re-evaluated prior to induction Oxygen Delivery Method: Circle System Utilized Preoxygenation: Pre-oxygenation with 100% oxygen Induction Type: IV induction Ventilation: Mask ventilation without difficulty and Oral airway inserted - appropriate to patient size Laryngoscope Size: 2 and Miller Grade View: Grade II Tube type: Oral Number of attempts: 1 Airway Equipment and Method: Stylet and Oral airway Placement Confirmation: ETT inserted through vocal cords under direct vision, positive ETCO2 and breath sounds checked- equal and bilateral Secured at: 23 (at the lip) cm Tube secured with: Tape Dental Injury: Teeth and Oropharynx as per pre-operative assessment  Comments: LMA 5 attempted, unable to seat. Pt. Intubated.

## 2023-11-03 NOTE — Transfer of Care (Signed)
 Immediate Anesthesia Transfer of Care Note  Patient: Patrick Kidd  Procedure(s) Performed: CYSTOSCOPY, WITH URETHRAL DILATION URETHROGRAM  Patient Location: PACU  Anesthesia Type:General  Level of Consciousness: awake, oriented, and patient cooperative  Airway & Oxygen Therapy: Patient Spontanous Breathing and Patient connected to face mask oxygen  Post-op Assessment: Report given to RN and Post -op Vital signs reviewed and stable  Post vital signs: Reviewed and stable  Last Vitals:  Vitals Value Taken Time  BP 145/74 11/03/23 13:01  Temp    Pulse 79 11/03/23 13:02  Resp 15 11/03/23 13:02  SpO2 99 % 11/03/23 13:02  Vitals shown include unfiled device data.  Last Pain:  Vitals:   11/03/23 1010  TempSrc: Oral  PainSc: 0-No pain         Complications: No notable events documented.

## 2023-11-03 NOTE — Anesthesia Postprocedure Evaluation (Signed)
 Anesthesia Post Note  Patient: Patrick Kidd  Procedure(s) Performed: CYSTOSCOPY, WITH URETHRAL DILATION URETHROGRAM     Patient location during evaluation: PACU Anesthesia Type: General Level of consciousness: awake and alert Pain management: pain level controlled Vital Signs Assessment: post-procedure vital signs reviewed and stable Respiratory status: spontaneous breathing, nonlabored ventilation, respiratory function stable and patient connected to nasal cannula oxygen Cardiovascular status: blood pressure returned to baseline and stable Postop Assessment: no apparent nausea or vomiting Anesthetic complications: no   No notable events documented.  Last Vitals:  Vitals:   11/03/23 1345 11/03/23 1353  BP:  (!) 146/75  Pulse:  70  Resp:  20  Temp: 36.4 C (!) 36.1 C  SpO2:  97%    Last Pain:  Vitals:   11/03/23 1353  TempSrc: Oral  PainSc: 0-No pain                 Artemis Loyal S

## 2023-11-04 ENCOUNTER — Encounter (HOSPITAL_COMMUNITY): Payer: Self-pay | Admitting: Urology

## 2023-11-10 DIAGNOSIS — M47816 Spondylosis without myelopathy or radiculopathy, lumbar region: Secondary | ICD-10-CM | POA: Diagnosis not present

## 2023-11-25 ENCOUNTER — Other Ambulatory Visit: Payer: Medicare Other

## 2023-11-25 ENCOUNTER — Ambulatory Visit: Payer: Medicare Other | Admitting: Hematology and Oncology

## 2023-11-25 ENCOUNTER — Other Ambulatory Visit: Payer: Self-pay | Admitting: Internal Medicine

## 2023-11-25 DIAGNOSIS — E118 Type 2 diabetes mellitus with unspecified complications: Secondary | ICD-10-CM

## 2023-11-30 DIAGNOSIS — M47816 Spondylosis without myelopathy or radiculopathy, lumbar region: Secondary | ICD-10-CM | POA: Diagnosis not present

## 2023-12-10 DIAGNOSIS — M47816 Spondylosis without myelopathy or radiculopathy, lumbar region: Secondary | ICD-10-CM | POA: Diagnosis not present

## 2023-12-15 DIAGNOSIS — R351 Nocturia: Secondary | ICD-10-CM | POA: Diagnosis not present

## 2023-12-15 DIAGNOSIS — N3281 Overactive bladder: Secondary | ICD-10-CM | POA: Diagnosis not present

## 2023-12-15 DIAGNOSIS — R350 Frequency of micturition: Secondary | ICD-10-CM | POA: Diagnosis not present

## 2024-01-05 ENCOUNTER — Telehealth: Payer: Self-pay

## 2024-01-05 DIAGNOSIS — K7030 Alcoholic cirrhosis of liver without ascites: Secondary | ICD-10-CM

## 2024-01-05 NOTE — Telephone Encounter (Signed)
-----   Message from [facility] J sent at 09/18/2023  5:29 PM EDT ----- Contact patient to do labs in December 2025: CBC/diff, CMET, INR, AFP , Dr CG patient

## 2024-01-05 NOTE — Telephone Encounter (Signed)
 I spoke to [place] and reminded him to come for lab work. Orders entered.

## 2024-01-14 ENCOUNTER — Other Ambulatory Visit (INDEPENDENT_AMBULATORY_CARE_PROVIDER_SITE_OTHER)

## 2024-01-14 ENCOUNTER — Other Ambulatory Visit: Payer: Self-pay | Admitting: Internal Medicine

## 2024-01-14 DIAGNOSIS — G6289 Other specified polyneuropathies: Secondary | ICD-10-CM

## 2024-01-14 DIAGNOSIS — K7030 Alcoholic cirrhosis of liver without ascites: Secondary | ICD-10-CM | POA: Diagnosis not present

## 2024-01-14 LAB — CBC WITH DIFFERENTIAL/PLATELET
Basophils Absolute: 0 K/uL (ref 0.0–0.1)
Basophils Relative: 0.3 % (ref 0.0–3.0)
Eosinophils Absolute: 0.1 K/uL (ref 0.0–0.7)
Eosinophils Relative: 0.8 % (ref 0.0–5.0)
HCT: 42.1 % (ref 39.0–52.0)
Hemoglobin: 14.6 g/dL (ref 13.0–17.0)
Lymphocytes Relative: 59.8 % — ABNORMAL HIGH (ref 12.0–46.0)
Lymphs Abs: 4.7 K/uL — ABNORMAL HIGH (ref 0.7–4.0)
MCHC: 34.7 g/dL (ref 30.0–36.0)
MCV: 95.1 fl (ref 78.0–100.0)
Monocytes Absolute: 0.4 K/uL (ref 0.1–1.0)
Monocytes Relative: 5.5 % (ref 3.0–12.0)
Neutro Abs: 2.6 K/uL (ref 1.4–7.7)
Neutrophils Relative %: 33.6 % — ABNORMAL LOW (ref 43.0–77.0)
Platelets: 115 K/uL — ABNORMAL LOW (ref 150.0–400.0)
RBC: 4.43 Mil/uL (ref 4.22–5.81)
RDW: 14.1 % (ref 11.5–15.5)
WBC: 7.8 K/uL (ref 4.0–10.5)

## 2024-01-14 LAB — COMPREHENSIVE METABOLIC PANEL WITH GFR
ALT: 10 U/L (ref 0–53)
AST: 16 U/L (ref 0–37)
Albumin: 4.3 g/dL (ref 3.5–5.2)
Alkaline Phosphatase: 82 U/L (ref 39–117)
BUN: 18 mg/dL (ref 6–23)
CO2: 32 meq/L (ref 19–32)
Calcium: 9.2 mg/dL (ref 8.4–10.5)
Chloride: 103 meq/L (ref 96–112)
Creatinine, Ser: 1.37 mg/dL (ref 0.40–1.50)
GFR: 49.16 mL/min — ABNORMAL LOW (ref >60.00)
Glucose, Bld: 192 mg/dL — ABNORMAL HIGH (ref 70–99)
Potassium: 4.9 meq/L (ref 3.5–5.1)
Sodium: 141 meq/L (ref 135–145)
Total Bilirubin: 1 mg/dL (ref 0.2–1.2)
Total Protein: 6.4 g/dL (ref 6.0–8.3)

## 2024-01-14 LAB — PROTIME-INR
INR: 1.1 ratio — ABNORMAL HIGH (ref 0.8–1.0)
Prothrombin Time: 11.4 s (ref 9.6–13.1)

## 2024-01-14 NOTE — Telephone Encounter (Signed)
 Copied from CRM #[id number]. Topic: Clinical - Medication Refill >> Jan 14, 2024  3:46 PM Macario HERO wrote: Medication: gabapentin  (NEURONTIN ) 300 MG capsule [[id number]]  Has the patient contacted their pharmacy? Yes (Agent: If no, request that the patient contact the pharmacy for the refill. If patient does not wish to contact the pharmacy document the reason why and proceed with request.) (Agent: If yes, when and what did the pharmacy advise?) call provider  This is the patient's preferred pharmacy:  [id number] - [place], [place] - [address] AT [facility] [address] [place] [address] Phone: [phone] Fax: [phone]   Is this the correct pharmacy for this prescription? Yes If no, delete pharmacy and type the correct one.   Has the prescription been filled recently? Yes  Is the patient out of the medication? No, almost  Has the patient been seen for an appointment in the last year OR does the patient have an upcoming appointment? Yes  Can we respond through MyChart? No  Agent: Please be advised that Rx refills may take up to 3 business days. We ask that you follow-up with your pharmacy.

## 2024-01-15 DIAGNOSIS — N401 Enlarged prostate with lower urinary tract symptoms: Secondary | ICD-10-CM | POA: Diagnosis not present

## 2024-01-15 DIAGNOSIS — R350 Frequency of micturition: Secondary | ICD-10-CM | POA: Diagnosis not present

## 2024-01-15 DIAGNOSIS — R351 Nocturia: Secondary | ICD-10-CM | POA: Diagnosis not present

## 2024-01-15 DIAGNOSIS — N3281 Overactive bladder: Secondary | ICD-10-CM | POA: Diagnosis not present

## 2024-01-18 ENCOUNTER — Telehealth: Payer: Self-pay

## 2024-01-18 ENCOUNTER — Ambulatory Visit: Payer: Self-pay | Admitting: Internal Medicine

## 2024-01-18 LAB — AFP TUMOR MARKER: AFP-Tumor Marker: 2 ng/mL (ref <6.1)

## 2024-01-18 NOTE — Telephone Encounter (Signed)
 Copied from CRM #[id number]. Topic: Clinical - Prescription Issue >> Jan 18, 2024  9:05 AM Robinson H wrote: Reason for CRM: Patients refill for the gabapentin  (NEURONTIN ) 300 MG capsule was denied due to patient needing an appointment. Patient scheduled for 1/19 but states he is out and needs something until he comes in for appointment, [facility]  [place] [phone]

## 2024-01-18 NOTE — Telephone Encounter (Signed)
 Copied from CRM [id number]. Topic: Clinical - Medication Question >> Jan 18, 2024 12:04 PM Olam RAMAN wrote: Reason for CRM: Pt is calling about gabapentin  (NEURONTIN ) 300 MG capsule stated he is out of meds and needs it. He does have an appt 1/19 but doesn't understand why provider needs to see him and would need something before his appt . Pt stated he take 3x and would like a 90 day supply CB [phone]

## 2024-01-19 ENCOUNTER — Telehealth: Payer: Self-pay

## 2024-01-19 NOTE — Telephone Encounter (Signed)
 Copied from CRM #[id number]. Topic: Clinical - Prescription Issue >> Jan 19, 2024  9:51 AM Patrick Kidd wrote: Reason for CRM: Patient states he has been out of his medication for 2 days now and has to keep calling back. The first time it was denied stating PCP requires an appt so he made an appt at first available in Jan and the prescription has still not been refilled. States this is not uncommon and is getting frustrated with having such a hard time getting this refilled.  Medication: gabapentin  (NEURONTIN ) 300 MG capsule  Pharmacy[id number] - [place], [place] - [address] AT [facility] [address] [place], [place] [address] Phone: [phone]  Fax: [phone]

## 2024-01-20 ENCOUNTER — Other Ambulatory Visit: Payer: Self-pay

## 2024-01-20 ENCOUNTER — Telehealth: Payer: Self-pay

## 2024-01-20 DIAGNOSIS — G6289 Other specified polyneuropathies: Secondary | ICD-10-CM

## 2024-01-20 MED ORDER — GABAPENTIN 300 MG PO CAPS: 300.0000 mg | ORAL_CAPSULE | Freq: Three times a day (TID) | ORAL | 0 refills | Status: DC

## 2024-01-20 NOTE — Telephone Encounter (Signed)
 Patients medication has been refilled and patient has been made aware.  Appointment has been scheduled for any further refills.

## 2024-01-20 NOTE — Telephone Encounter (Signed)
 Patients medication has been refilled. Patient has been made aware. Patient has an appointment with Dr. Joshua for any further refills.

## 2024-01-20 NOTE — Telephone Encounter (Signed)
 Patient has been made aware that his medication is infact at the pharmacy and he can go pick it up at his earliest convenience. He gave a verbal understanding.

## 2024-01-20 NOTE — Telephone Encounter (Signed)
 Copied from CRM #[id number]. Topic: Clinical - Medication Question >> Jan 19, 2024  5:27 PM Shereese L wrote: Reason for CRM:  Pt is calling about gabapentin  (NEURONTIN ) 300 MG capsule stated he is out of meds and needs it. He does have an appt 1/19 but doesn't understand why provider needs to see him and would need something before his appt . Pt stated he take 5x and would like a 90 day supply CB [phone]

## 2024-01-20 NOTE — Telephone Encounter (Signed)
 Copied from CRM #[id number]. Topic: Clinical - Medication Question >> Jan 20, 2024  9:10 AM Fonda T wrote: Reason for CRM: Pt calling back, states he was hung up on previously, but he is calling requesting an update status of previously requested medication refill on gabapentin  (NEURONTIN ) 300 MG capsule. States he has been without medication for almost a week, and has made multiple calls in regards to requesting medication, that he has to take daily.   Pt is scheduled, next appt is 02/22/24.  Called and spoke to front office, Rocky, states she would speak with provider nurse, and have nurse follow up with pt. today.  Pt advised of above, verbalized understanding.  Pt is aware of same day call, and states he will be waiting for a return call today, at [phone].   [facility]erred [facility]acy[id number] [place], [place] - [address] AT [facility] [address] [place] [address] Phone: [phone] Fax: [phone]

## 2024-01-20 NOTE — Telephone Encounter (Signed)
 Medication has been refilled today, Patient has been made aware.

## 2024-01-20 NOTE — Telephone Encounter (Signed)
 Medication has been refilled.

## 2024-01-20 NOTE — Telephone Encounter (Signed)
 Copied from CRM #[id number]. Topic: General - Call Back - No Documentation >> Jan 20, 2024  8:54 AM Mercedes MATSU wrote: Reason for CRM: Patient called in stating that he was speaking with Jasmine and then got disconnected. There is no documentation in the patients chart, patient is also requesting a call back and can be reached at [phone]. Patient disconnected while holding.

## 2024-01-21 DIAGNOSIS — M5416 Radiculopathy, lumbar region: Secondary | ICD-10-CM | POA: Diagnosis not present

## 2024-01-21 DIAGNOSIS — Z6829 Body mass index (BMI) 29.0-29.9, adult: Secondary | ICD-10-CM | POA: Diagnosis not present

## 2024-01-21 DIAGNOSIS — M47816 Spondylosis without myelopathy or radiculopathy, lumbar region: Secondary | ICD-10-CM | POA: Diagnosis not present

## 2024-01-22 ENCOUNTER — Other Ambulatory Visit: Payer: Self-pay | Admitting: Neurosurgery

## 2024-01-22 DIAGNOSIS — M5416 Radiculopathy, lumbar region: Secondary | ICD-10-CM

## 2024-02-01 ENCOUNTER — Inpatient Hospital Stay: Attending: Hematology and Oncology

## 2024-02-01 ENCOUNTER — Inpatient Hospital Stay (HOSPITAL_BASED_OUTPATIENT_CLINIC_OR_DEPARTMENT_OTHER): Admitting: Hematology and Oncology

## 2024-02-01 VITALS — BP 147/71 | HR 91 | Temp 98.7°F | Resp 18 | Wt 226.2 lb

## 2024-02-01 DIAGNOSIS — D6959 Other secondary thrombocytopenia: Secondary | ICD-10-CM | POA: Insufficient documentation

## 2024-02-01 DIAGNOSIS — C9110 Chronic lymphocytic leukemia of B-cell type not having achieved remission: Secondary | ICD-10-CM | POA: Diagnosis not present

## 2024-02-01 DIAGNOSIS — Z79899 Other long term (current) drug therapy: Secondary | ICD-10-CM

## 2024-02-01 DIAGNOSIS — K7030 Alcoholic cirrhosis of liver without ascites: Secondary | ICD-10-CM | POA: Diagnosis not present

## 2024-02-01 DIAGNOSIS — R161 Splenomegaly, not elsewhere classified: Secondary | ICD-10-CM | POA: Insufficient documentation

## 2024-02-01 DIAGNOSIS — D696 Thrombocytopenia, unspecified: Secondary | ICD-10-CM

## 2024-02-01 DIAGNOSIS — D72820 Lymphocytosis (symptomatic): Secondary | ICD-10-CM

## 2024-02-01 LAB — CBC WITH DIFFERENTIAL (CANCER CENTER ONLY)
Abs Immature Granulocytes: 0.02 K/uL (ref 0.00–0.07)
Basophils Absolute: 0 K/uL (ref 0.0–0.1)
Basophils Relative: 0 %
Eosinophils Absolute: 0 K/uL (ref 0.0–0.5)
Eosinophils Relative: 0 %
HCT: 41.7 % (ref 39.0–52.0)
Hemoglobin: 14.9 g/dL (ref 13.0–17.0)
Immature Granulocytes: 0 %
Lymphocytes Relative: 53 %
Lymphs Abs: 3.7 K/uL (ref 0.7–4.0)
MCH: 32.4 pg (ref 26.0–34.0)
MCHC: 35.7 g/dL (ref 30.0–36.0)
MCV: 90.7 fL (ref 80.0–100.0)
Monocytes Absolute: 0.5 K/uL (ref 0.1–1.0)
Monocytes Relative: 7 %
Neutro Abs: 2.8 K/uL (ref 1.7–7.7)
Neutrophils Relative %: 40 %
Platelet Count: 87 K/uL — ABNORMAL LOW (ref 150–400)
RBC: 4.6 MIL/uL (ref 4.22–5.81)
RDW: 12.9 % (ref 11.5–15.5)
WBC Count: 7.1 K/uL (ref 4.0–10.5)
nRBC: 0 % (ref 0.0–0.2)

## 2024-02-01 LAB — CMP (CANCER CENTER ONLY)
ALT: 9 U/L (ref 0–44)
AST: 22 U/L (ref 15–41)
Albumin: 4.4 g/dL (ref 3.5–5.0)
Alkaline Phosphatase: 83 U/L (ref 38–126)
Anion gap: 11 (ref 5–15)
BUN: 14 mg/dL (ref 8–23)
CO2: 26 mmol/L (ref 22–32)
Calcium: 9.5 mg/dL (ref 8.9–10.3)
Chloride: 104 mmol/L (ref 98–111)
Creatinine: 1.15 mg/dL (ref 0.61–1.24)
GFR, Estimated: >60 mL/min
Glucose, Bld: 158 mg/dL — ABNORMAL HIGH (ref 70–99)
Potassium: 4.5 mmol/L (ref 3.5–5.1)
Sodium: 141 mmol/L (ref 135–145)
Total Bilirubin: 1.3 mg/dL — ABNORMAL HIGH (ref 0.0–1.2)
Total Protein: 6.9 g/dL (ref 6.5–8.1)

## 2024-02-01 NOTE — Progress Notes (Signed)
 " [facility] [phone]   [phone]  PROGRESS NOTE  Patient Care Team: Patrick Debby CROME, MD as PCP - General (Internal Medicine) Patrick Dorn PARAS, MD as PCP - Cardiology (Cardiology) Patrick Juliene SAUNDERS, DO as Consulting Physician (Neurology) Patrick Kidd, [facility] (Inactive) (Pharmacist) Patrick Heron NOVAK, RN as Case Manager Pa, Patrick Kidd [facility]  Hematological/Oncological History # Chronic Lymphocytic Leukemia (CLL) Rai Stage 0 # Thrombocytopenia 2/2 to Cirrhosis 10/04/2020: establish care with Patrick Kidd. WBC 9.7, ALC 5100. 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 male with medical history significant for CLL who presents for a follow up visit. The patient's last visit was on 08/04/2023. In the interim since the last visit he has had no major changes in his health.   On exam today Patrick Kidd reports that he has been fine overall with interim since our last visit.  He does continue to have some mobility issues and balance issues.  He reports he send no recent hospitalizations, ER visits, or new medications.  He reports he is currently taking oxybutynin for bladder spasms.  He reports his energy levels are good and his appetite is strong.  His weight has been increasing and he is up to 226 pounds, up from 215 pounds previously.  He reports he does not notice any bumps or lumps concerning for lymphadenopathy.  He has no trouble eating and denies any abdominal swelling concerning for lymphadenopathy.  Otherwise he reports nothing out of the ordinary.  A full 10 point ROS is otherwise negative.  MEDICAL HISTORY:  Past Medical History:  Diagnosis Date   Acrophobia    Alcoholic cirrhosis (HCC) 10/04/2015   Anemia    Arthritis    foot by big toe   BPH associated with nocturia    Cataract    removed both eyes   Cholecystitis 05/2022   tx Abx    Chronic cough    PMH of   CLL (chronic lymphocytic leukemia) (HCC)    COVID-19 12/2018   Diabetes mellitus without complication (HCC)    Diverticulosis 07/03/2010   Colonoscopy.    Duodenal ulcer 2017   Fallen arches    Bilateral   GERD (gastroesophageal reflux disease)    Gout    Granuloma annulare    Hx of adenomatous colonic polyps multiple   Hydrocele 2011   Large septated right hydrocele   Hypertension    Liver cyst    Liver lesion    Nonspecific elevation of levels of transaminase or lactic acid dehydrogenase (LDH)    Obesity    Peripheral neuropathy    Plantar fasciitis    PMH of   Pneumonia    Portal hypertension (HCC) 2017   Prostate cancer (HCC)    Right shoulder pain 11/2017   Sleep apnea    no cpap, patient denies   Thiamine  deficiency    Wears reading eyeglasses    WPW (Wolff-Parkinson-White syndrome) 02/05/2019    SURGICAL HISTORY: Past Surgical History:  Procedure Laterality Date   BACK SURGERY  04/28/2022   CATARACT EXTRACTION, BILATERAL  12/2011   Dr Roz   CHOLECYSTECTOMY, ROBOT-ASSISTED, LAPAROSCOPIC  03/2023   [facility]   COLONOSCOPY  2017   COLONOSCOPY W/ POLYPECTOMY  07/03/2010   2 adenomas, diverticulosis on right. Dr Patrick Kidd   CYSTOSCOPY N/A 03/15/2018   Procedure: Patrick Kidd;  Surgeon: Patrick Sherwood JONETTA DOUGLAS, MD;  Location: [place]  [place];  Service: Urology;  Laterality: N/A;  NO SEEDS FOUND IN BLADDER   CYSTOSCOPY WITH URETHRAL DILATATION N/A 11/03/2023   Procedure: CYSTOSCOPY, WITH URETHRAL DILATION;  Surgeon: Patrick Hamilton, MD;  Location: [facility];  Service: Urology;  Laterality: N/A;   ESOPHAGOGASTRODUODENOSCOPY (EGD) WITH PROPOFOL  N/A 01/08/2016   Procedure: ESOPHAGOGASTRODUODENOSCOPY (EGD) WITH PROPOFOL ;  Surgeon: Patrick ONEIDA Buddy, MD;  Location: [facility];  Service: Endoscopy;  Laterality: N/A;   FLEXIBLE SIGMOIDOSCOPY  2000   IR EXCHANGE BILIARY DRAIN  02/17/2023   IR PATIENT EVAL TECH 0-60 MINS  12/26/2022   IR  PERC CHOLECYSTOSTOMY  12/05/2022   IR PERC CHOLECYSTOSTOMY  12/09/2022   IR RADIOLOGIST EVAL & MGMT  01/16/2023   IR RADIOLOGIST EVAL & MGMT  07/31/2023   IR RADIOLOGIST EVAL & MGMT  08/05/2023   RADIOACTIVE SEED IMPLANT N/A 03/15/2018   Procedure: RADIOACTIVE SEED IMPLANT/BRACHYTHERAPY IMPLANT;  Surgeon: Patrick Sherwood JONETTA DOUGLAS, MD;  Location: [facility] ;  Service: Urology;  Laterality: N/A;   47 SEEDS IMPLANTED   RADIOLOGY WITH ANESTHESIA N/A 12/09/2022   Procedure: IR WITH ANESTHESIA;  Surgeon: Radiologist, Medication, MD;  Location: [facility];  Service: Radiology;  Laterality: N/A;   SIGMOIDOSCOPY     SPACE OAR INSTILLATION N/A 03/15/2018   Procedure: SPACE OAR INSTILLATION;  Surgeon: Patrick Sherwood JONETTA DOUGLAS, MD;  Location: [facility];  Service: Urology;  Laterality: N/A;   URETHROGRAM N/A 11/03/2023   Procedure: URETHROGRAM;  Surgeon: Patrick Hamilton, MD;  Location: [facility];  Service: Urology;  Laterality: N/A;   WISDOM TOOTH EXTRACTION      SOCIAL HISTORY: Social History   Socioeconomic History   Marital status: Married    Spouse name: Patrick Kidd   Number of children: 2   Years of education: Not on file   Highest education level: Some college, no degree  Occupational History   Occupation: Merchandiser, Retail time  Tobacco Use   Smoking status: Former    Current packs/day: 0.00    Average packs/day: 2.0 packs/day for 6.0 years (12.0 ttl pk-yrs)    Types: Cigarettes    Start date: 02/03/1965    Quit date: 02/04/1971    Years since quitting: 53.0    Passive exposure: Never   Smokeless tobacco: Never   Tobacco comments:    smoked age [age], up to 2 ppd  Vaping Use   Vaping status: Never Used  Substance and Sexual Activity   Alcohol use: Not Currently    Comment: alcohol since 2020   Drug use: No   Sexual activity: Yes    Partners: Female  Other Topics Concern   Not on file  Social History Narrative   Worked in multimedia programmer estate   2 children    Former smoker no drug use   Alcoholism, abstinent since 01/23/2019 most recently   Fun/Hobby: Play golf, grandchildren, [employer]      Patient is left-handed. He lives with his wife in a one level home. He swims most days. 2025   Social Drivers of Health   Tobacco Use: Medium Risk (11/03/2023)   Patient History    Smoking Tobacco Use: Former    Smokeless Tobacco Use: Never    Passive Exposure: Never  Physicist, Medical Strain: Low Risk (08/24/2023)   Overall Financial Resource Strain (CARDIA)    Difficulty of Paying Living Expenses: Not hard at all  Food Insecurity: No Food Insecurity (08/24/2023)   Epic    Worried About Running Out of  Food in the Last Year: Never true    Ran Out of Food in the Last Year: Never true  Transportation Needs: No Transportation Needs (08/24/2023)   Epic    Lack of Transportation (Medical): No    Lack of Transportation (Non-Medical): No  Physical Activity: Insufficiently Active (08/24/2023)   Exercise Vital Sign    Days of Exercise per Week: 1 day    Minutes of Exercise per Session: 40 min  Stress: No Stress Concern Present (08/24/2023)   [employer] of Occupational Health - Occupational Stress Questionnaire    Feeling of Stress: Not at all  Social Connections: Moderately Isolated (08/24/2023)   Social Connection and Isolation Panel    Frequency of Communication with Friends and Family: More than three times a week    Frequency of Social Gatherings with Friends and Family: Three times a week    Attends Religious Services: Never    Active Member of Clubs or Organizations: No    Attends Banker Meetings: Never    Marital Status: Married  Catering Manager Violence: Not At Risk (08/24/2023)   Epic    Fear of Current or Ex-Partner: No    Emotionally Abused: No    Physically Abused: No    Sexually Abused: No  Depression (PHQ2-9): Low Risk (08/31/2023)   Depression (PHQ2-9)    PHQ-2 Score: 0  Alcohol Screen: Low Risk (08/24/2023)   Alcohol  Screen    Last Alcohol Screening Score (AUDIT): 0  Housing: Unknown (08/24/2023)   Epic    Unable to Pay for Housing in the Last Year: No    Number of Times Moved in the Last Year: Not on file    Homeless in the Last Year: No  Utilities: Not At Risk (08/24/2023)   Epic    Threatened with loss of utilities: No  Health Literacy: Adequate Health Literacy (08/24/2023)   B1300 Health Literacy    Frequency of need for help with medical instructions: Never    FAMILY HISTORY: Family History  Problem Relation Age of Onset   Lung cancer Mother        smoker   Leukemia Father        Acute myelocytic   Diabetes Neg Hx    Stroke Neg Hx    Heart disease Neg Hx    Colon cancer Neg Hx    Colon polyps Neg Hx    Esophageal cancer Neg Hx    Rectal cancer Neg Hx    Stomach cancer Neg Hx     ALLERGIES:  is allergic to rifaximin .  MEDICATIONS:  Current Outpatient Medications  Medication Sig Dispense Refill   gabapentin  (NEURONTIN ) 300 MG capsule Take 1 capsule (300 mg total) by mouth 3 (three) times daily. Please keep upcoming appointment for any medication refills. 99 capsule 0   Insulin  Pen Needle (BD PEN NEEDLE MINI ULTRAFINE) 31G X 5 MM MISC INJECT 1 ACT INTO THE SKIN DAILY. USE TO ADMINISTER INSULIN . DX E11.9   Strength: 31G X 5 MM 100 each 1   pantoprazole  (PROTONIX ) 40 MG tablet TAKE 1 TABLET BY MOUTH 2 TIMES DAILY 30 MINUTES BEFORE MEALS (BREAKFAST AND SUPPER) 180 tablet 0   thiamine  (VITAMIN B-1) 50 MG tablet Take 1 tablet (50 mg total) by mouth daily. 90 tablet 1   zinc  gluconate 50 MG tablet Take 1 tablet (50 mg total) by mouth daily. 90 tablet 0   No current facility-administered medications for this visit.    REVIEW OF SYSTEMS:  Constitutional: ( - ) fevers, ( - )  chills , ( - ) night sweats Eyes: ( - ) blurriness of vision, ( - ) double vision, ( - ) watery eyes Ears, nose, mouth, throat, and face: ( - ) mucositis, ( - ) sore throat Respiratory: ( - ) cough, ( - ) dyspnea, (  - ) wheezes Cardiovascular: ( - ) palpitation, ( - ) chest discomfort, ( - ) lower extremity swelling Gastrointestinal:  ( - ) nausea, ( - ) heartburn, ( - ) change in bowel habits Skin: ( - ) abnormal skin rashes Lymphatics: ( - ) new lymphadenopathy, ( - ) easy bruising Neurological: ( - ) numbness, ( - ) tingling, ( - ) new weaknesses Behavioral/Psych: ( - ) mood change, ( - ) new changes  All other systems were reviewed with the patient and are negative.  PHYSICAL EXAMINATION: ECOG PERFORMANCE STATUS: 1 - Symptomatic but completely ambulatory  Vitals:   02/01/24 1451  BP: (!) 147/71  Pulse: 91  Resp: 18  Temp: 98.7 F (37.1 C)  SpO2: 97%     Filed Weights   02/01/24 1451  Weight: 226 lb 3.2 oz (102.6 kg)      GENERAL: Well-appearing elderly Caucasian male, alert, no distress and comfortable SKIN: skin color, texture, turgor are normal, no rashes or significant lesions EYES: conjunctiva are pink and non-injected, sclera clear NECK: supple, non-tender LUNGS: clear to auscultation and percussion with normal breathing effort HEART: regular rate & rhythm and no murmurs and no lower extremity edema Musculoskeletal: no cyanosis of digits and no clubbing  PSYCH: alert & oriented x 3, fluent speech NEURO: no focal motor/sensory deficits  LABORATORY DATA:  I have reviewed the data as listed    Latest Ref Rng & Units 02/01/2024    2:47 PM 01/14/2024    2:37 PM 11/02/2023    2:28 PM  CBC  WBC 4.0 - 10.5 K/uL 7.1  7.8  8.6   Hemoglobin 13.0 - 17.0 g/dL 85.0  85.3  84.6   Hematocrit 39.0 - 52.0 % 41.7  42.1  43.7   Platelets 150 - 400 K/uL 87  115.0  111        Latest Ref Rng & Units 02/01/2024    2:47 PM 01/14/2024    2:37 PM 11/02/2023    2:28 PM  CMP  Glucose 70 - 99 mg/dL 841  807  850   BUN 8 - 23 mg/dL 14  18  18    Creatinine 0.61 - 1.24 mg/dL 8.84  8.62  8.94   Sodium 135 - 145 mmol/L 141  141  139   Potassium 3.5 - 5.1 mmol/L 4.5  4.9  4.4   Chloride 98 -  111 mmol/L 104  103  103   CO2 22 - 32 mmol/L 26  32  31   Calcium  8.9 - 10.3 mg/dL 9.5  9.2  9.6   Total Protein 6.5 - 8.1 g/dL 6.9  6.4  7.1   Total Bilirubin 0.0 - 1.2 mg/dL 1.3  1.0  1.2   Alkaline Phos 38 - 126 U/L 83  82  86   AST 15 - 41 U/L 22  16  19    ALT 0 - 44 U/L 9  10  9     RADIOGRAPHIC STUDIES: I have personally reviewed the radiological images as listed and agreed with the findings in the report. No results found.   ASSESSMENT & PLAN Patrick Kidd is a  78 y.o. male with medical history significant for CLL who presents for a follow up visit.  # Chronic Lymphocytic Leukemia (CLL) Rai Stage 0 --Findings are consistent with chronic lymphocytic leukemia.  He is currently Rai stage 0. --No evidence of lymphadenopathy. Evidence of splenomegaly but most likely secondary to hepatic cirrhosis and portal vein hypertension.  --CLL prognostic panel showed del 13, IGVH (inconclusive), and Zap 70 (borderline)  --labs today show WBC 7.1, Hgb 14.9, MCV 90.7, Plt 87  --Plan for return to clinic in 3 months for labs and 6 months for labs/follow up  #Anemia--resolved --Suspect secondary to recent hospitalization for underlying hematoma with sepsis --No evidence of iron, b12 or folate deficiencies.  --Continue to monitor.   # Thrombocytopenia --patient noted to have cirrhotic morphology of the liver on US  from 07/24/2020 --stronger evidence this represents thrombocytopenia 2/2 to liver disease and not from the CLL --continue to monitor   #Left hip/flank pain: --Xrays obtained to ruled out fracture or acute process. All negative.    No orders of the defined types were placed in this encounter.   All questions were answered. The patient knows to call the clinic with any problems, questions or concerns.  A total of more than 30 minutes were spent on this encounter with face-to-face time and non-face-to-face time, including preparing to see the patient, ordering tests and/or  medications, counseling the patient and coordination of care as outlined above.   Norleen IVAR Kidney, MD Department of Hematology/Oncology [facility] Phone: [phone] Pager: [phone] Email: [email or web address]    02/01/2024 3:55 PM "

## 2024-02-02 ENCOUNTER — Telehealth: Payer: Self-pay

## 2024-02-02 NOTE — Telephone Encounter (Signed)
 Copied from CRM #[id number]. Topic: Clinical - Medication Question >> Feb 02, 2024 10:09 AM Amber H wrote: Reason for CRM: Patient c/o headache, running nose with no fever. Stated he and his wife tested positive for COVID via at home test. Stated they were not that sick but wants med called in for symps. Stated feels like a cold.    [phone]

## 2024-02-02 NOTE — Telephone Encounter (Signed)
 Please advise

## 2024-02-03 ENCOUNTER — Other Ambulatory Visit: Payer: Self-pay | Admitting: Internal Medicine

## 2024-02-03 DIAGNOSIS — J208 Acute bronchitis due to other specified organisms: Secondary | ICD-10-CM | POA: Insufficient documentation

## 2024-02-03 MED ORDER — PROMETHAZINE-DM 6.25-15 MG/5ML PO SYRP: 5.0000 mL | ORAL_SOLUTION | Freq: Four times a day (QID) | ORAL | 0 refills | Status: DC | PRN

## 2024-02-03 MED ORDER — NIRMATRELVIR/RITONAVIR (PAXLOVID)TABLET: 3.0000 | ORAL_TABLET | Freq: Two times a day (BID) | ORAL | 0 refills | Status: AC

## 2024-02-03 NOTE — Progress Notes (Unsigned)
 Estimated Creatinine Clearance: 66.6 mL/min (by C-G formula based on SCr of 1.15 mg/dL).

## 2024-02-12 ENCOUNTER — Ambulatory Visit
Admission: RE | Admit: 2024-02-12 | Discharge: 2024-02-12 | Disposition: A | Source: Ambulatory Visit | Attending: Neurosurgery | Admitting: Neurosurgery

## 2024-02-12 ENCOUNTER — Other Ambulatory Visit (HOSPITAL_COMMUNITY): Payer: Self-pay | Admitting: Interventional Radiology

## 2024-02-12 DIAGNOSIS — K819 Cholecystitis, unspecified: Secondary | ICD-10-CM

## 2024-02-12 DIAGNOSIS — M5416 Radiculopathy, lumbar region: Secondary | ICD-10-CM

## 2024-02-15 ENCOUNTER — Telehealth: Payer: Self-pay

## 2024-02-15 NOTE — Telephone Encounter (Signed)
 Copied from CRM #[id number]. Topic: Clinical - Medication Prior Auth >> Feb 15, 2024  1:20 PM Shereese L wrote: Reason for CRM: Patient is needing a prior auth for Paxlovid  medication that was prescribed on 12/31. He stated that he had to come out of $400 because there was no prior auth sent to the pharmacy. Patient stated that he spoke with [employer] and was adv it would have been covered if sent.   Script [facility] to [id number] - [place], [place] - [address] AT [facility] [address] [place] [address] Phone: [phone] Fax: [phone] Hours: Open 24 hours   CB# [phone]

## 2024-02-15 NOTE — Telephone Encounter (Signed)
 Please advise

## 2024-02-17 ENCOUNTER — Other Ambulatory Visit (HOSPITAL_COMMUNITY): Payer: Self-pay

## 2024-02-17 ENCOUNTER — Telehealth: Payer: Self-pay

## 2024-02-17 NOTE — Telephone Encounter (Signed)
 Pharmacy Patient Advocate Encounter   Received notification from Physician's Office that prior authorization for Paxlovid  tablets is required/requested.   Insurance verification completed.   The patient is insured through [employer].   Per test claim: The current 5 day co-pay is, $765.85.  No PA needed at this time. This test claim was processed through [facility]- copay amounts may vary at other pharmacies due to pharmacy/plan contracts, or as the patient moves through the different stages of their insurance plan.

## 2024-02-22 ENCOUNTER — Ambulatory Visit: Payer: Self-pay | Admitting: Internal Medicine

## 2024-02-22 ENCOUNTER — Ambulatory Visit: Admitting: Internal Medicine

## 2024-02-22 ENCOUNTER — Ambulatory Visit

## 2024-02-22 ENCOUNTER — Encounter: Payer: Self-pay | Admitting: Internal Medicine

## 2024-02-22 VITALS — BP 150/82 | HR 99 | Temp 97.7°F | Resp 16 | Ht 74.0 in | Wt 224.4 lb

## 2024-02-22 DIAGNOSIS — E1140 Type 2 diabetes mellitus with diabetic neuropathy, unspecified: Secondary | ICD-10-CM

## 2024-02-22 DIAGNOSIS — E785 Hyperlipidemia, unspecified: Secondary | ICD-10-CM | POA: Diagnosis not present

## 2024-02-22 DIAGNOSIS — Z23 Encounter for immunization: Secondary | ICD-10-CM

## 2024-02-22 DIAGNOSIS — I480 Paroxysmal atrial fibrillation: Secondary | ICD-10-CM

## 2024-02-22 DIAGNOSIS — I10 Essential (primary) hypertension: Secondary | ICD-10-CM

## 2024-02-22 DIAGNOSIS — I491 Atrial premature depolarization: Secondary | ICD-10-CM | POA: Diagnosis not present

## 2024-02-22 DIAGNOSIS — R052 Subacute cough: Secondary | ICD-10-CM

## 2024-02-22 DIAGNOSIS — G6289 Other specified polyneuropathies: Secondary | ICD-10-CM

## 2024-02-22 LAB — D-DIMER, QUANTITATIVE: D-Dimer, Quant: 1.11 ug{FEU}/mL — ABNORMAL HIGH

## 2024-02-22 LAB — TSH: TSH: 1.13 u[IU]/mL (ref 0.35–5.50)

## 2024-02-22 LAB — HEMOGLOBIN A1C: Hgb A1c MFr Bld: 6.6 % — ABNORMAL HIGH (ref 4.6–6.5)

## 2024-02-22 LAB — TROPONIN I (HIGH SENSITIVITY): High Sens Troponin I: 18 ng/L — ABNORMAL HIGH (ref 2–17)

## 2024-02-22 LAB — VITAMIN B12: Vitamin B-12: 290 pg/mL (ref 211–911)

## 2024-02-22 MED ORDER — GABAPENTIN 300 MG PO CAPS: 300.0000 mg | ORAL_CAPSULE | Freq: Three times a day (TID) | ORAL | 1 refills | Status: AC

## 2024-02-22 NOTE — Progress Notes (Unsigned)
 "       Subjective:  Patient ID: Patrick Kidd, male    DOB: 19-Jun-1945  Age: 79 y.o. MRN: [id number]  CC: Diabetes and Cough   HPI Patrick Kidd presents for f/up ----    Discussed the use of AI scribe software for clinical note transcription with the patient, who gave verbal consent to proceed.  History of Present Illness Patrick Kidd is a 79 year old male who presents with an elevated heart rate and balance problems.  He recently experienced an episode of COVID-19, from which he has mostly recovered, though he still has some mucus in his lungs and occasional coughing. He took Paxlovid  and reports that his symptoms resolved in three days. His wife also had COVID-19, taking eight days to recover.  He is aware of an elevated heart rate, which he recently discovered and states is the highest it has ever been. No weakness, dizziness, or lightheadedness. He has not taken any medications that would stimulate his heart rate, such as decongestants.  He reports a persistent balance problem that is not improving.  He mentions a wound on the back of his heel caused by catching his foot in the sheet and injuring it with his toenail. He also notes increased neuropathy in his leg, which is more pronounced than during his last visit.     Outpatient Medications Prior to Visit  Medication Sig Dispense Refill   oxybutynin (DITROPAN-XL) 10 MG 24 hr tablet Take 10 mg by mouth daily.     pantoprazole  (PROTONIX ) 40 MG tablet TAKE 1 TABLET BY MOUTH 2 TIMES DAILY 30 MINUTES BEFORE MEALS (BREAKFAST AND SUPPER) 180 tablet 0   thiamine  (VITAMIN B-1) 50 MG tablet Take 1 tablet (50 mg total) by mouth daily. 90 tablet 1   zinc  gluconate 50 MG tablet Take 1 tablet (50 mg total) by mouth daily. 90 tablet 0   gabapentin  (NEURONTIN ) 300 MG capsule Take 1 capsule (300 mg total) by mouth 3 (three) times daily. Please keep upcoming appointment for any medication refills. 99 capsule 0    Insulin  Pen Needle (BD PEN NEEDLE MINI ULTRAFINE) 31G X 5 MM MISC INJECT 1 ACT INTO THE SKIN DAILY. USE TO ADMINISTER INSULIN . DX E11.9   Strength: 31G X 5 MM 100 each 1   promethazine -dextromethorphan  (PROMETHAZINE -DM) 6.25-15 MG/5ML syrup Take 5 mLs by mouth 4 (four) times daily as needed for cough. 118 mL 0   No facility-administered medications prior to visit.    ROS Review of Systems  Objective:  BP (!) 150/82 (BP Location: Left Arm, Patient Position: Sitting, Cuff Size: Normal)   Pulse 99   Temp 97.7 F (36.5 C) (Oral)   Resp 16   Ht 6' 2 (1.88 m)   Wt 224 lb 6.4 oz (101.8 kg)   SpO2 99%   BMI 28.81 kg/m   BP Readings from Last 3 Encounters:  02/22/24 (!) 150/82  02/01/24 (!) 147/71  11/03/23 (!) 146/75    Wt Readings from Last 3 Encounters:  02/22/24 224 lb 6.4 oz (101.8 kg)  02/01/24 226 lb 3.2 oz (102.6 kg)  11/03/23 214 lb (97.1 kg)    Physical Exam Vitals reviewed.  Constitutional:      General: He is not in acute distress.    Appearance: He is ill-appearing. He is not toxic-appearing or diaphoretic.  HENT:     Mouth/Throat:     Mouth: Mucous membranes are moist.  Eyes:     General: No  scleral icterus.    Conjunctiva/sclera: Conjunctivae normal.  Cardiovascular:     Rate and Rhythm: Normal rate and regular rhythm.     Pulses: Normal pulses.     Heart sounds: No murmur heard.    No friction rub. No gallop.     Comments: EKG--- SR with PAC's (new), 99 bpm Inferior ST/T wave changes (new) No Q waves Pulmonary:     Effort: Pulmonary effort is normal.     Breath sounds: No stridor. No wheezing, rhonchi or rales.  Abdominal:     General: Abdomen is protuberant. Bowel sounds are normal. There is no distension.     Palpations: Abdomen is soft. There is no hepatomegaly, splenomegaly or mass.     Tenderness: There is no abdominal tenderness. There is no guarding or rebound.     Hernia: No hernia is present.  Musculoskeletal:        General: Normal  range of motion.     Cervical back: Neck supple.     Right lower leg: No edema.     Left lower leg: No edema.  Lymphadenopathy:     Cervical: No cervical adenopathy.  Skin:    General: Skin is warm and dry.  Neurological:     General: No focal deficit present.     Mental Status: He is alert.  Psychiatric:        Mood and Affect: Mood normal.     Lab Results  Component Value Date   WBC 7.1 02/01/2024   HGB 14.9 02/01/2024   HCT 41.7 02/01/2024   PLT 87 (L) 02/01/2024   GLUCOSE 158 (H) 02/01/2024   CHOL 178 04/28/2023   TRIG 90.0 04/28/2023   HDL 69.80 04/28/2023   LDLCALC 90 04/28/2023   ALT 9 02/01/2024   AST 22 02/01/2024   NA 141 02/01/2024   K 4.5 02/01/2024   CL 104 02/01/2024   CREATININE 1.15 02/01/2024   BUN 14 02/01/2024   CO2 26 02/01/2024   TSH 1.60 04/28/2023   PSA 0.2 05/15/2021   INR 1.1 (H) 01/14/2024   HGBA1C 6.0 (H) 10/28/2023   MICROALBUR 10.4 (H) 04/28/2023    MR LUMBAR SPINE WO CONTRAST Result Date: 02/20/2024 CLINICAL DATA:  Radiculopathy, lumbar region. Low back pain with right leg pain and weakness. Previous back surgery. No recent injury. EXAM: MRI LUMBAR SPINE WITHOUT CONTRAST TECHNIQUE: Multiplanar, multisequence MR imaging of the lumbar spine was performed. No intravenous contrast was administered. COMPARISON:  Abdominopelvic CT 07/31/2023. CT lumbar spine 07/09/2023. Lumbar MRI 04/09/2021. FINDINGS: Segmentation: Conventional anatomy assumed, with the last open disc space designated L5-S1.Concordant with prior imaging. Alignment: Mild convex left scoliosis. Stable degenerative grade 1 anterolisthesis at L3-4 and L5-S1. Vertebrae: No worrisome osseous lesion, acute fracture or pars defect. Sacroiliac degenerative changes bilaterally. Conus medullaris: Extends to the T12-L1 level. The conus and cauda equina appear normal. Paraspinal and other soft tissues: No significant paraspinal findings. Disc levels: Sagittal images demonstrate chronic prominent  osteophytes anteriorly at T11-12 and T12-L1. No associated spinal stenosis or foraminal narrowing. L1-2: Preserved disc height with mild disc bulging and bilateral facet hypertrophy. No significant spinal stenosis or foraminal narrowing. L2-3: Mild chronic loss of disc height with annular disc bulging and endplate osteophytes. Moderate facet and ligamentous hypertrophy. Resulting mildly increased multifactorial spinal stenosis, now moderate. Mild lateral recess narrowing bilaterally. The foramina remain sufficiently patent. L3-4: Chronic loss of disc height with annular disc bulging and endplate osteophytes. Compared with the previous MRI, posterior decompression has been  performed on the right with adequate decompression of the spinal canal. There is moderate facet hypertrophy contributing to mild foraminal narrowing bilaterally. L4-5: Chronic loss of disc height with annular disc bulging and endplate osteophytes asymmetric to the left. Compared with previous MRI, posterior decompression has been performed at this level with adequate patency of the spinal canal. Mild residual narrowing of the left lateral recess and both foramina. L5-S1: Preserved disc height with mild disc bulging and endplate osteophytes. Compared with previous MRI, interval right-sided laminectomy. There is moderate bilateral facet hypertrophy with residual lateral recess narrowing, right greater than left. The spinal canal and foramina appear sufficiently patent. IMPRESSION: 1. Compared with previous MRI from 04/09/2021, interval posterior decompression at L3-4, L4-5 and L5-S1. No significant residual spinal stenosis or definite nerve root impingement at the operative levels. 2. Mildly increased multifactorial spinal stenosis at L2-3, now moderate. 3. No high-grade foraminal narrowing or definite nerve root encroachment. Electronically Signed   By: Elsie Perone M.D.   On: 02/20/2024 11:22   DG Chest 2 View Result Date: 02/22/2024 CLINICAL  DATA:  Cough.  Recent history of COVID. EXAM: CHEST - 2 VIEW COMPARISON:  08/04/2023. FINDINGS: The heart size and mediastinal contours are within normal limits. No focal consolidation, pleural effusion, or pneumothorax. No acute osseous abnormality. IMPRESSION: No acute cardiopulmonary findings. Electronically Signed   By: Harrietta Sherry M.D.   On: 02/22/2024 16:30      Assessment & Plan:  Type 2 diabetes mellitus with diabetic neuropathy, without long-term current use of insulin  (HCC) -     HM Diabetes Foot Exam -     Vitamin B12; Future -     Hemoglobin A1c; Future -     Microalbumin / creatinine urine ratio; Future -     Urinalysis, Routine w reflex microscopic; Future  Other polyneuropathy -     Gabapentin ; Take 1 capsule (300 mg total) by mouth 3 (three) times daily. Please keep upcoming appointment for any medication refills.  Dispense: 270 capsule; Refill: 1  Paroxysmal atrial fibrillation (HCC)  Essential hypertension -     TSH; Future -     EKG 12-Lead  Hyperlipidemia LDL goal <100 -     TSH; Future  Need for immunization against influenza -     Flu vaccine HIGH DOSE PF(Fluzone Trivalent)  Subacute cough -     DG Chest 2 View; Future  Premature atrial contractions -     Troponin I (High Sensitivity); Future -     D-dimer, quantitative; Future     Follow-up: Return in about 3 months (around 05/22/2024).  Debby Molt, MD "

## 2024-02-22 NOTE — Patient Instructions (Signed)
Premature Atrial Contraction  A premature atrial contraction [facility]) is a kind of irregular heartbeat (arrhythmia). The heart has four chambers, including the upper chambers (atria) and lower chambers (ventricles). Normally, an electrical signal starts in a group of cells called the sinoatrial node (SA node) and travels through the atria, causing them to pump blood into the ventricles. During a PAC, the atria beat too early, before they have had time to fill with blood. The heartbeat pauses afterward so the heart can fill with blood for the next beat. Sometimes PAC can be a warning sign of another type of arrhythmia called atrial fibrillation. Atrial fibrillation may allow blood to pool in the atria and form clots. If a clot travels to the brain, it can cause a stroke. What are the causes? The cause of this condition is often unknown. Sometimes, this condition may be caused by heart disease or injury to the heart. What increases the risk? You are more likely to develop this condition if: You have heart disease. You are 40 years of age or older. Episodes may be triggered by: Tobacco, alcohol, or caffeine use. Stimulant drugs. Some medicines or supplements. Stress and anxiety. What are the signs or symptoms? Symptoms of this condition include: The feeling of a pause in the heartbeat. The first heartbeat after the "skipped" beat may feel more forceful. A feeling that your heart is fluttering. A quick feeling of dizziness or faintness. How is this diagnosed? This condition is diagnosed based on: Your medical history or symptoms. A physical exam. Your health care provider may listen to your heart. An ECG (electrocardiogram) to monitor the electrical activity of your heart. An ambulatory cardiac monitor that records your heartbeats for 24 hours or more. You may also have: Blood tests. An echocardiogram, which creates an image of your heart. How is this treated? Treatment depends on how often  your symptoms happen and other risk factors. Treatments may include: Medicines. A catheter ablation procedure to destroy the part of the heart tissue that sends abnormal signals. In many cases, treatment may not be needed. Follow these instructions at home: Lifestyle  Do not use any products that contain nicotine or tobacco. These products include cigarettes, chewing tobacco, and vaping devices, such as e-cigarettes. If you need help quitting, ask your health care provider. Exercise regularly. Ask your health care provider what type of exercise is safe for you. Try to get at least 7-9 hours of sleep each night. Find healthy ways to manage stress. Avoid stressful situations when possible. Alcohol use Do not drink alcohol if: Your health care provider tells you not to drink. You are pregnant, may be pregnant, or are planning to become pregnant. Alcohol triggers your episodes. If you drink alcohol: Limit how much you have to: 0-1 drink a day for women. 0-2 drinks a day for men. Know how much alcohol is in your drink. In the [place] one drink equals one 12 oz bottle of beer (355 mL), one 5 oz glass of wine (148 mL), or one 1 oz glass of hard liquor (44 mL). General instructions Take over-the-counter and prescription medicines only as told by your health care provider. If caffeine triggers episodes, do not eat, drink, or use anything with caffeine in it. Contact a health care provider if: You feel your heart is fluttering. Your heart skips beats and you feel dizzy, light-headed, or very tired. Get help right away if: You have chest pain. You have trouble breathing. You faint. You have any symptoms of  a stroke. "BE FAST" is an easy way to remember the main warning signs of a stroke: B - Balance. Signs are dizziness, sudden trouble walking, or loss of balance. E - Eyes. Signs are trouble seeing or a sudden change in vision. F - Face. Signs are sudden weakness or numbness of the face, or the  face or eyelid drooping on one side. A - Arms. Signs are weakness or numbness in an arm. This happens suddenly and usually on one side of the body. S - Speech. Signs are sudden trouble speaking, slurred speech, or trouble understanding what people say. T - Time. Time to call emergency services. Write down what time symptoms started. You have other signs of stroke, such as: A sudden, severe headache with no known cause. Nausea or vomiting. Seizure. These symptoms may be an emergency. Get help right away. Call 911. Do not wait to see if the symptoms will go away. Do not drive yourself to the hospital. This information is not intended to replace advice given to you by your health care provider. Make sure you discuss any questions you have with your health care provider. Document Revised: 06/21/2021 Document Reviewed: 06/21/2021 Elsevier Patient Education  2024 [employer].

## 2024-02-23 ENCOUNTER — Telehealth: Payer: Self-pay

## 2024-02-23 NOTE — Telephone Encounter (Signed)
 Copied from CRM [id number]. Topic: Clinical - Prescription Issue >> Feb 23, 2024  2:08 PM Alexandria E wrote: Reason for CRM: Patient called in regarding a refill status of his gabapentin . Relayed to patient that the pharmacy confirmed the receipt yesterday 1/19 in the afternoon, however, patient spoke with pharmacy today and they are stating the medication is on hold.

## 2024-02-24 NOTE — Telephone Encounter (Signed)
 Patient has been made aware that the pharmacy is getting his medication ready and it'll be ready in about an hour.

## 2024-02-25 LAB — URINALYSIS, ROUTINE W REFLEX MICROSCOPIC
Bilirubin Urine: NEGATIVE
Ketones, ur: NEGATIVE
Leukocytes,Ua: NEGATIVE
Nitrite: NEGATIVE
Specific Gravity, Urine: 1.015 (ref 1.000–1.030)
Total Protein, Urine: NEGATIVE
Urine Glucose: NEGATIVE
Urobilinogen, UA: 0.2 (ref 0.0–1.0)
WBC, UA: NONE SEEN
pH: 6 (ref 5.0–8.0)

## 2024-02-25 LAB — MICROALBUMIN / CREATININE URINE RATIO
Creatinine,U: 68.5 mg/dL
Microalb Creat Ratio: 153 mg/g — ABNORMAL HIGH (ref 0.0–30.0)
Microalb, Ur: 10.5 mg/dL — ABNORMAL HIGH (ref 0.7–1.9)

## 2024-02-26 ENCOUNTER — Telehealth: Payer: Self-pay | Admitting: Internal Medicine

## 2024-02-26 MED ORDER — METOPROLOL SUCCINATE ER 25 MG PO TB24: 25.0000 mg | ORAL_TABLET | Freq: Every day | ORAL | 0 refills | Status: AC

## 2024-02-26 NOTE — Progress Notes (Signed)
 Care Guide Pharmacy Note  02/26/2024 Name: Patrick Kidd MRN: [id number] DOB: 01/27/1946  Referred By: Joshua Debby CROME, MD Reason for referral: No chief complaint on file.   Patrick Kidd is a 79 y.o. year old male who is a primary care patient of Joshua Debby CROME, MD.  Patrick Kidd was referred to the pharmacist for assistance related to: HTN  Successful contact was made with the patient to discuss pharmacy services including being ready for the pharmacist to call at least 5 minutes before the scheduled appointment time and to have medication bottles and any blood pressure readings ready for review. The patient agreed to meet with the pharmacist via telephone visit on 03/02/2024.  Patrick Kidd [facility] Guide Direct Dial : [phone]  Fax: [phone]

## 2024-03-02 ENCOUNTER — Other Ambulatory Visit

## 2024-03-02 DIAGNOSIS — I10 Essential (primary) hypertension: Secondary | ICD-10-CM

## 2024-03-02 NOTE — Progress Notes (Signed)
 "  03/02/2024 Name: Patrick Kidd MRN: [id number] DOB: March 16, 1945  Chief Complaint  Patient presents with   Hypertension   Medication Management    Patrick Kidd is a 79 y.o. year old male who presented for a telephone visit.   They were referred to the pharmacist by their PCP for assistance in managing hypertension.    Subjective:  Care Team: Primary Care Provider: Joshua Debby CROME, MD ; Next Scheduled Visit: none   Medication Access/Adherence  Current Pharmacy:  [id number] [place], [place] - [address] AT [facility] [address] [place] [address] Phone: [phone] Fax: [phone]  [facility] [address] [place] [address] Phone: [phone] Fax: [phone]  [facility] - [facility] [address] [place] [address] Phone: [phone] Fax: [phone]   Patient reports affordability concerns with their medications: No  Patient reports access/transportation concerns to their pharmacy: No  Patient reports adherence concerns with their medications:  No    Pt expresses confusion regarding what the result of his recent appt/tests were. He notes he was told about labs through MyChart but did not understand what everything meant.  Hypertension:  Current medications: none Medications previously tried: lisinopril   Pt has not picked up metoprolol  - he did not receive notification from the pharmacy and he did not know that it was prescribed.  Patient has a validated, automated, upper arm home BP cuff Current blood pressure readings: none  Patient denies hypotensive s/sx including dizziness, lightheadedness.  Patient denies hypertensive symptoms including headache, chest pain, shortness of breath  Objective:  Lab Results  Component Value Date   HGBA1C 6.6 (H) 02/22/2024    Lab Results  Component Value Date   CREATININE 1.15  02/01/2024   BUN 14 02/01/2024   NA 141 02/01/2024   K 4.5 02/01/2024   CL 104 02/01/2024   CO2 26 02/01/2024    Lab Results  Component Value Date   CHOL 178 04/28/2023   HDL 69.80 04/28/2023   LDLCALC 90 04/28/2023   TRIG 90.0 04/28/2023   CHOLHDL 3 04/28/2023    Medications Reviewed Today     Reviewed by Merceda Lela SAUNDERS, RPH-CPP (Pharmacist) on 03/09/24 at [phone]  Med List Status: <None>   Medication Order Taking? Sig Documenting Provider Last Dose Status Informant  gabapentin  (NEURONTIN ) 300 MG capsule [id number] Yes Take 1 capsule (300 mg total) by mouth 3 (three) times daily. Please keep upcoming appointment for any medication refills. Joshua Debby CROME, MD  Active   metoprolol  succinate (TOPROL -XL) 25 MG 24 hr tablet [id number]  Take 1 tablet (25 mg total) by mouth daily.  Patient not taking: Reported on 03/09/2024   Joshua Debby CROME, MD  Active   oxybutynin (DITROPAN-XL) 10 MG 24 hr tablet [id number] Yes Take 10 mg by mouth daily. [provider]  Active   pantoprazole  (PROTONIX ) 40 MG tablet [id number] Yes TAKE 1 TABLET BY MOUTH 2 TIMES DAILY 30 MINUTES BEFORE MEALS (BREAKFAST AND SUPPER) Joshua Debby CROME, MD  Active Self  thiamine  (VITAMIN B-1) 50 MG tablet [id number] Yes Take 1 tablet (50 mg total) by mouth daily. Joshua Debby CROME, MD  Active Self  zinc  gluconate 50 MG tablet [id number] Yes Take 1 tablet (50 mg total) by mouth daily. Joshua Debby CROME, MD  Active Self              Assessment/Plan:   Hypertension: - Currently  uncontrolled, BP goal <130/80 - Reviewed recent appt note and lab tests with patient - Recommend to check BP and HR at home - Recommend to start metoprolol  and monitor BP - Recommended to contact his cardiologist to schedule a f/u   Follow Up Plan: PRN  Darrelyn Drum, PharmD, BCPS, CPP Clinical Pharmacist Practitioner [facility] [phone]    "

## 2024-08-01 ENCOUNTER — Inpatient Hospital Stay

## 2024-08-24 ENCOUNTER — Ambulatory Visit

## 2024-08-29 ENCOUNTER — Ambulatory Visit: Admitting: Neurology

## 2025-02-22 ENCOUNTER — Inpatient Hospital Stay

## 2025-02-22 ENCOUNTER — Inpatient Hospital Stay: Admitting: Hematology and Oncology
